# Patient Record
Sex: Male | Born: 1970 | Race: Black or African American | Hispanic: No | Marital: Married | Smoking: Never smoker
Health system: Southern US, Community
[De-identification: ages and names within clinical notes are randomized; demographics above are authoritative.]

## PROBLEM LIST (undated history)

## (undated) DIAGNOSIS — I10 Essential (primary) hypertension: Secondary | ICD-10-CM

## (undated) DIAGNOSIS — G931 Anoxic brain damage, not elsewhere classified: Secondary | ICD-10-CM

## (undated) HISTORY — PX: SHOULDER SURGERY: SHX246

## (undated) HISTORY — PX: BACK SURGERY: SHX140

---

## 2018-04-08 ENCOUNTER — Emergency Department
Admission: EM | Admit: 2018-04-08 | Discharge: 2018-04-08 | Disposition: A | Discharge status: Home / Self Care | Payer: Self-pay | Attending: Emergency Medicine | Admitting: Emergency Medicine

## 2018-04-08 ENCOUNTER — Encounter: Payer: Self-pay | Admitting: Emergency Medicine

## 2018-04-08 DIAGNOSIS — I10 Essential (primary) hypertension: Secondary | ICD-10-CM | POA: Insufficient documentation

## 2018-04-08 DIAGNOSIS — Z202 Contact with and (suspected) exposure to infections with a predominantly sexual mode of transmission: Secondary | ICD-10-CM | POA: Insufficient documentation

## 2018-04-08 DIAGNOSIS — H1132 Conjunctival hemorrhage, left eye: Secondary | ICD-10-CM | POA: Insufficient documentation

## 2018-04-08 LAB — CHLAMYDIA/NGC RT PCR (ARMC ONLY)
Chlamydia Tr: NOT DETECTED
N gonorrhoeae: NOT DETECTED

## 2018-04-08 MED ORDER — FLUORESCEIN SODIUM 1 MG OP STRP
1.0000 | ORAL_STRIP | Freq: Once | OPHTHALMIC | Status: AC
  Administered 2018-04-08: 1 via OPHTHALMIC
  Filled 2018-04-08: qty 1

## 2018-04-08 MED ORDER — TETRACAINE HCL 0.5 % OP SOLN
1.0000 [drp] | Freq: Once | OPHTHALMIC | Status: AC
  Administered 2018-04-08: 1 [drp] via OPHTHALMIC
  Filled 2018-04-08: qty 4

## 2018-04-08 MED ORDER — METRONIDAZOLE 500 MG PO TABS: 2000.0000 mg | ORAL_TABLET | Freq: Once | ORAL | 0 refills | Status: AC
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
## 2018-04-08 NOTE — ED Notes (Signed)
Pt is to provide dirty urine sample for STD screening. After providing urine, MD will swab pt for wet prep. Pt given fluids at this time to encourage urination
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
## 2018-04-08 NOTE — Discharge Instructions (Signed)
As I explained to you, your gonorrhea and chlamydia testing were negative. If your spouse tested positive for trichomonas please take the antibiotic dose prescribed to prevent her from getting infected again after her treatment.   Make sure to follow up with [facility] in 2 days for re-evaluation.
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
## 2019-12-06 ENCOUNTER — Emergency Department: Payer: BC Managed Care – PPO

## 2019-12-06 ENCOUNTER — Telehealth: Payer: Self-pay | Admitting: Emergency Medicine

## 2019-12-06 ENCOUNTER — Encounter: Payer: Self-pay | Admitting: Emergency Medicine

## 2019-12-06 ENCOUNTER — Other Ambulatory Visit: Payer: Self-pay

## 2019-12-06 ENCOUNTER — Emergency Department
Admission: EM | Admit: 2019-12-06 | Discharge: 2019-12-06 | Disposition: A | Discharge status: Home / Self Care | Payer: BC Managed Care – PPO | Attending: Emergency Medicine | Admitting: Emergency Medicine

## 2019-12-06 DIAGNOSIS — Y9389 Activity, other specified: Secondary | ICD-10-CM | POA: Diagnosis not present

## 2019-12-06 DIAGNOSIS — S61215A Laceration without foreign body of left ring finger without damage to nail, initial encounter: Secondary | ICD-10-CM | POA: Insufficient documentation

## 2019-12-06 DIAGNOSIS — Z79899 Other long term (current) drug therapy: Secondary | ICD-10-CM | POA: Insufficient documentation

## 2019-12-06 DIAGNOSIS — I10 Essential (primary) hypertension: Secondary | ICD-10-CM | POA: Diagnosis not present

## 2019-12-06 HISTORY — DX: Essential (primary) hypertension: I10

## 2019-12-06 MED ORDER — LISINOPRIL 10 MG PO TABS: 10.0000 mg | ORAL_TABLET | Freq: Every day | ORAL | 1 refills | Status: DC

## 2019-12-06 MED ORDER — LISINOPRIL 10 MG PO TABS
10.0000 mg | ORAL_TABLET | Freq: Once | ORAL | Status: AC
  Administered 2019-12-06: 10 mg via ORAL
  Filled 2019-12-06: qty 1

## 2019-12-06 MED ORDER — AMOXICILLIN-POT CLAVULANATE 875-125 MG PO TABS
1.0000 | ORAL_TABLET | Freq: Once | ORAL | Status: AC
  Administered 2019-12-06: 1 via ORAL
  Filled 2019-12-06: qty 1

## 2019-12-06 MED ORDER — AMOXICILLIN-POT CLAVULANATE 875-125 MG PO TABS: 1.0000 | ORAL_TABLET | Freq: Two times a day (BID) | ORAL | 0 refills | Status: AC

## 2019-12-06 NOTE — Discharge Instructions (Signed)
Follow-up with your primary care provider or [facility] acute care if any continued problems.  Begin taking antibiotics as directed to prevent infection and keep the area clean and dry.  Also take your blood pressure every day as it was elevated at 187/110 today.  You also need to follow-up with your primary care provider to have your blood pressure rechecked to see if additional blood pressure medication is needed.
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
## 2019-12-06 NOTE — ED Notes (Signed)
See triage note; pt is ambulatory to the room in no acute distress, bleeding is controlled.
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
## 2021-03-23 ENCOUNTER — Inpatient Hospital Stay: Payer: BC Managed Care – PPO

## 2021-03-23 ENCOUNTER — Inpatient Hospital Stay
Admit: 2021-03-23 | Discharge: 2021-03-23 | Disposition: A | Discharge status: Home / Self Care | Payer: BC Managed Care – PPO | Attending: Cardiology | Admitting: Cardiology

## 2021-03-23 ENCOUNTER — Emergency Department: Payer: BC Managed Care – PPO

## 2021-03-23 ENCOUNTER — Encounter: Payer: Self-pay | Admitting: Cardiology

## 2021-03-23 ENCOUNTER — Encounter
Admission: EM | Disposition: A | Discharge status: Other Acute Inpatient Hospital | Payer: Self-pay | Source: Home / Self Care | Attending: Cardiology

## 2021-03-23 ENCOUNTER — Inpatient Hospital Stay
Admission: EM | Admit: 2021-03-23 | Discharge: 2021-03-30 | DRG: 286 | Disposition: A | Discharge status: Other Acute Inpatient Hospital | Payer: BC Managed Care – PPO | Attending: Internal Medicine | Admitting: Internal Medicine

## 2021-03-23 DIAGNOSIS — I2109 ST elevation (STEMI) myocardial infarction involving other coronary artery of anterior wall: Secondary | ICD-10-CM | POA: Diagnosis not present

## 2021-03-23 DIAGNOSIS — J9690 Respiratory failure, unspecified, unspecified whether with hypoxia or hypercapnia: Secondary | ICD-10-CM

## 2021-03-23 DIAGNOSIS — I5020 Unspecified systolic (congestive) heart failure: Secondary | ICD-10-CM | POA: Diagnosis not present

## 2021-03-23 DIAGNOSIS — I130 Hypertensive heart and chronic kidney disease with heart failure and stage 1 through stage 4 chronic kidney disease, or unspecified chronic kidney disease: Secondary | ICD-10-CM | POA: Diagnosis present

## 2021-03-23 DIAGNOSIS — G9341 Metabolic encephalopathy: Secondary | ICD-10-CM | POA: Diagnosis present

## 2021-03-23 DIAGNOSIS — N182 Chronic kidney disease, stage 2 (mild): Secondary | ICD-10-CM | POA: Diagnosis present

## 2021-03-23 DIAGNOSIS — I4891 Unspecified atrial fibrillation: Secondary | ICD-10-CM | POA: Diagnosis present

## 2021-03-23 DIAGNOSIS — I2510 Atherosclerotic heart disease of native coronary artery without angina pectoris: Secondary | ICD-10-CM | POA: Diagnosis present

## 2021-03-23 DIAGNOSIS — Z515 Encounter for palliative care: Secondary | ICD-10-CM | POA: Diagnosis not present

## 2021-03-23 DIAGNOSIS — E870 Hyperosmolality and hypernatremia: Secondary | ICD-10-CM | POA: Diagnosis not present

## 2021-03-23 DIAGNOSIS — G40901 Epilepsy, unspecified, not intractable, with status epilepticus: Secondary | ICD-10-CM | POA: Diagnosis present

## 2021-03-23 DIAGNOSIS — R1310 Dysphagia, unspecified: Secondary | ICD-10-CM | POA: Diagnosis not present

## 2021-03-23 DIAGNOSIS — Y732 Prosthetic and other implants, materials and accessory gastroenterology and urology devices associated with adverse incidents: Secondary | ICD-10-CM | POA: Diagnosis not present

## 2021-03-23 DIAGNOSIS — G928 Other toxic encephalopathy: Secondary | ICD-10-CM | POA: Diagnosis present

## 2021-03-23 DIAGNOSIS — T783XXA Angioneurotic edema, initial encounter: Secondary | ICD-10-CM | POA: Diagnosis not present

## 2021-03-23 DIAGNOSIS — I5021 Acute systolic (congestive) heart failure: Secondary | ICD-10-CM | POA: Diagnosis present

## 2021-03-23 DIAGNOSIS — A419 Sepsis, unspecified organism: Secondary | ICD-10-CM | POA: Diagnosis not present

## 2021-03-23 DIAGNOSIS — J151 Pneumonia due to Pseudomonas: Secondary | ICD-10-CM | POA: Diagnosis not present

## 2021-03-23 DIAGNOSIS — J15211 Pneumonia due to Methicillin susceptible Staphylococcus aureus: Secondary | ICD-10-CM | POA: Diagnosis not present

## 2021-03-23 DIAGNOSIS — G40409 Other generalized epilepsy and epileptic syndromes, not intractable, without status epilepticus: Secondary | ICD-10-CM | POA: Diagnosis not present

## 2021-03-23 DIAGNOSIS — I2782 Chronic pulmonary embolism: Secondary | ICD-10-CM | POA: Diagnosis not present

## 2021-03-23 DIAGNOSIS — Z20822 Contact with and (suspected) exposure to covid-19: Secondary | ICD-10-CM | POA: Diagnosis present

## 2021-03-23 DIAGNOSIS — K7200 Acute and subacute hepatic failure without coma: Secondary | ICD-10-CM | POA: Diagnosis present

## 2021-03-23 DIAGNOSIS — I472 Ventricular tachycardia, unspecified: Secondary | ICD-10-CM | POA: Diagnosis present

## 2021-03-23 DIAGNOSIS — G253 Myoclonus: Secondary | ICD-10-CM | POA: Diagnosis not present

## 2021-03-23 DIAGNOSIS — N179 Acute kidney failure, unspecified: Secondary | ICD-10-CM | POA: Diagnosis present

## 2021-03-23 DIAGNOSIS — I5023 Acute on chronic systolic (congestive) heart failure: Secondary | ICD-10-CM | POA: Diagnosis not present

## 2021-03-23 DIAGNOSIS — A4152 Sepsis due to Pseudomonas: Secondary | ICD-10-CM | POA: Diagnosis not present

## 2021-03-23 DIAGNOSIS — I10 Essential (primary) hypertension: Secondary | ICD-10-CM

## 2021-03-23 DIAGNOSIS — G9389 Other specified disorders of brain: Secondary | ICD-10-CM | POA: Diagnosis present

## 2021-03-23 DIAGNOSIS — E440 Moderate protein-calorie malnutrition: Secondary | ICD-10-CM | POA: Diagnosis not present

## 2021-03-23 DIAGNOSIS — E785 Hyperlipidemia, unspecified: Secondary | ICD-10-CM | POA: Diagnosis present

## 2021-03-23 DIAGNOSIS — M7989 Other specified soft tissue disorders: Secondary | ICD-10-CM | POA: Diagnosis not present

## 2021-03-23 DIAGNOSIS — A4101 Sepsis due to Methicillin susceptible Staphylococcus aureus: Secondary | ICD-10-CM | POA: Diagnosis not present

## 2021-03-23 DIAGNOSIS — I4901 Ventricular fibrillation: Secondary | ICD-10-CM | POA: Diagnosis present

## 2021-03-23 DIAGNOSIS — Z7189 Other specified counseling: Secondary | ICD-10-CM | POA: Diagnosis not present

## 2021-03-23 DIAGNOSIS — Z4659 Encounter for fitting and adjustment of other gastrointestinal appliance and device: Secondary | ICD-10-CM

## 2021-03-23 DIAGNOSIS — Z930 Tracheostomy status: Secondary | ICD-10-CM | POA: Diagnosis not present

## 2021-03-23 DIAGNOSIS — T464X5A Adverse effect of angiotensin-converting-enzyme inhibitors, initial encounter: Secondary | ICD-10-CM | POA: Diagnosis present

## 2021-03-23 DIAGNOSIS — J189 Pneumonia, unspecified organism: Secondary | ICD-10-CM | POA: Diagnosis not present

## 2021-03-23 DIAGNOSIS — J9601 Acute respiratory failure with hypoxia: Secondary | ICD-10-CM | POA: Diagnosis not present

## 2021-03-23 DIAGNOSIS — Z9911 Dependence on respirator [ventilator] status: Secondary | ICD-10-CM | POA: Diagnosis not present

## 2021-03-23 DIAGNOSIS — I462 Cardiac arrest due to underlying cardiac condition: Secondary | ICD-10-CM | POA: Diagnosis present

## 2021-03-23 DIAGNOSIS — R509 Fever, unspecified: Secondary | ICD-10-CM | POA: Diagnosis not present

## 2021-03-23 DIAGNOSIS — R739 Hyperglycemia, unspecified: Secondary | ICD-10-CM | POA: Diagnosis present

## 2021-03-23 DIAGNOSIS — Z79899 Other long term (current) drug therapy: Secondary | ICD-10-CM | POA: Diagnosis not present

## 2021-03-23 DIAGNOSIS — I161 Hypertensive emergency: Secondary | ICD-10-CM | POA: Diagnosis present

## 2021-03-23 DIAGNOSIS — R6520 Severe sepsis without septic shock: Secondary | ICD-10-CM | POA: Diagnosis not present

## 2021-03-23 DIAGNOSIS — Y92009 Unspecified place in unspecified non-institutional (private) residence as the place of occurrence of the external cause: Secondary | ICD-10-CM

## 2021-03-23 DIAGNOSIS — G931 Anoxic brain damage, not elsewhere classified: Secondary | ICD-10-CM | POA: Diagnosis present

## 2021-03-23 DIAGNOSIS — Z8674 Personal history of sudden cardiac arrest: Secondary | ICD-10-CM | POA: Diagnosis present

## 2021-03-23 DIAGNOSIS — Z9289 Personal history of other medical treatment: Secondary | ICD-10-CM | POA: Diagnosis not present

## 2021-03-23 DIAGNOSIS — J9602 Acute respiratory failure with hypercapnia: Secondary | ICD-10-CM | POA: Diagnosis present

## 2021-03-23 DIAGNOSIS — Z7901 Long term (current) use of anticoagulants: Secondary | ICD-10-CM | POA: Diagnosis not present

## 2021-03-23 DIAGNOSIS — E669 Obesity, unspecified: Secondary | ICD-10-CM | POA: Diagnosis not present

## 2021-03-23 DIAGNOSIS — R0902 Hypoxemia: Secondary | ICD-10-CM

## 2021-03-23 DIAGNOSIS — I110 Hypertensive heart disease with heart failure: Secondary | ICD-10-CM | POA: Diagnosis not present

## 2021-03-23 DIAGNOSIS — J9621 Acute and chronic respiratory failure with hypoxia: Secondary | ICD-10-CM | POA: Diagnosis not present

## 2021-03-23 DIAGNOSIS — T17908D Unspecified foreign body in respiratory tract, part unspecified causing other injury, subsequent encounter: Secondary | ICD-10-CM | POA: Diagnosis not present

## 2021-03-23 DIAGNOSIS — Y846 Urinary catheterization as the cause of abnormal reaction of the patient, or of later complication, without mention of misadventure at the time of the procedure: Secondary | ICD-10-CM | POA: Diagnosis not present

## 2021-03-23 DIAGNOSIS — Z538 Procedure and treatment not carried out for other reasons: Secondary | ICD-10-CM | POA: Diagnosis not present

## 2021-03-23 DIAGNOSIS — J690 Pneumonitis due to inhalation of food and vomit: Secondary | ICD-10-CM | POA: Diagnosis not present

## 2021-03-23 DIAGNOSIS — Y848 Other medical procedures as the cause of abnormal reaction of the patient, or of later complication, without mention of misadventure at the time of the procedure: Secondary | ICD-10-CM | POA: Diagnosis not present

## 2021-03-23 DIAGNOSIS — R7881 Bacteremia: Secondary | ICD-10-CM | POA: Diagnosis not present

## 2021-03-23 DIAGNOSIS — Y95 Nosocomial condition: Secondary | ICD-10-CM | POA: Diagnosis not present

## 2021-03-23 DIAGNOSIS — I493 Ventricular premature depolarization: Secondary | ICD-10-CM | POA: Diagnosis present

## 2021-03-23 DIAGNOSIS — I469 Cardiac arrest, cause unspecified: Secondary | ICD-10-CM | POA: Diagnosis not present

## 2021-03-23 DIAGNOSIS — J95851 Ventilator associated pneumonia: Secondary | ICD-10-CM | POA: Diagnosis not present

## 2021-03-23 DIAGNOSIS — I2699 Other pulmonary embolism without acute cor pulmonale: Secondary | ICD-10-CM | POA: Diagnosis not present

## 2021-03-23 DIAGNOSIS — L89893 Pressure ulcer of other site, stage 3: Secondary | ICD-10-CM | POA: Diagnosis not present

## 2021-03-23 DIAGNOSIS — D649 Anemia, unspecified: Secondary | ICD-10-CM | POA: Diagnosis not present

## 2021-03-23 DIAGNOSIS — E7220 Disorder of urea cycle metabolism, unspecified: Secondary | ICD-10-CM | POA: Diagnosis not present

## 2021-03-23 HISTORY — PX: CORONARY/GRAFT ACUTE MI REVASCULARIZATION: CATH118305

## 2021-03-23 HISTORY — PX: LEFT HEART CATH AND CORONARY ANGIOGRAPHY: CATH118249

## 2021-03-23 LAB — PROTIME-INR
INR: 1 (ref 0.8–1.2)
Prothrombin Time: 13.5 seconds (ref 11.4–15.2)

## 2021-03-23 LAB — COMPREHENSIVE METABOLIC PANEL
ALT: 130 U/L — ABNORMAL HIGH (ref 0–44)
AST: 150 U/L — ABNORMAL HIGH (ref 15–41)
Albumin: 3.8 g/dL (ref 3.5–5.0)
Alkaline Phosphatase: 74 U/L (ref 38–126)
Anion gap: 14 (ref 5–15)
BUN: 26 mg/dL — ABNORMAL HIGH (ref 6–20)
CO2: 20 mmol/L — ABNORMAL LOW (ref 22–32)
Calcium: 8.4 mg/dL — ABNORMAL LOW (ref 8.9–10.3)
Chloride: 104 mmol/L (ref 98–111)
Creatinine, Ser: 2.08 mg/dL — ABNORMAL HIGH (ref 0.61–1.24)
GFR, Estimated: 38 mL/min — ABNORMAL LOW (ref >60)
Glucose, Bld: 249 mg/dL — ABNORMAL HIGH (ref 70–99)
Potassium: 3.5 mmol/L (ref 3.5–5.1)
Sodium: 138 mmol/L (ref 135–145)
Total Bilirubin: 1.2 mg/dL (ref 0.3–1.2)
Total Protein: 7.2 g/dL (ref 6.5–8.1)

## 2021-03-23 LAB — CBC WITH DIFFERENTIAL/PLATELET
Abs Immature Granulocytes: 0.39 10*3/uL — ABNORMAL HIGH (ref 0.00–0.07)
Basophils Absolute: 0 10*3/uL (ref 0.0–0.1)
Basophils Relative: 0 %
Eosinophils Absolute: 0.2 10*3/uL (ref 0.0–0.5)
Eosinophils Relative: 1 %
HCT: 45.2 % (ref 39.0–52.0)
Hemoglobin: 14.8 g/dL (ref 13.0–17.0)
Immature Granulocytes: 3 %
Lymphocytes Relative: 44 %
Lymphs Abs: 5.6 10*3/uL — ABNORMAL HIGH (ref 0.7–4.0)
MCH: 30.5 pg (ref 26.0–34.0)
MCHC: 32.7 g/dL (ref 30.0–36.0)
MCV: 93.2 fL (ref 80.0–100.0)
Monocytes Absolute: 0.7 10*3/uL (ref 0.1–1.0)
Monocytes Relative: 6 %
Neutro Abs: 5.8 10*3/uL (ref 1.7–7.7)
Neutrophils Relative %: 46 %
Platelets: 228 10*3/uL (ref 150–400)
RBC: 4.85 MIL/uL (ref 4.22–5.81)
RDW: 13 % (ref 11.5–15.5)
WBC: 12.6 10*3/uL — ABNORMAL HIGH (ref 4.0–10.5)
nRBC: 0 % (ref 0.0–0.2)

## 2021-03-23 LAB — MRSA NEXT GEN BY PCR, NASAL: MRSA by PCR Next Gen: NOT DETECTED

## 2021-03-23 LAB — CBG MONITORING, ED: Glucose-Capillary: 284 mg/dL — ABNORMAL HIGH (ref 70–99)

## 2021-03-23 LAB — BLOOD GAS, ARTERIAL
Acid-base deficit: 0.3 mmol/L (ref 0.0–2.0)
Bicarbonate: 25.9 mmol/L (ref 20.0–28.0)
FIO2: 0.6
MECHVT: 500 mL
Mechanical Rate: 20
O2 Saturation: 99.7 %
PEEP: 5 cmH2O
Patient temperature: 36.2
RATE: 20 resp/min
pCO2 arterial: 45 mmHg (ref 32.0–48.0)
pH, Arterial: 7.36 (ref 7.350–7.450)
pO2, Arterial: 212 mmHg — ABNORMAL HIGH (ref 83.0–108.0)

## 2021-03-23 LAB — ECHOCARDIOGRAM COMPLETE
AR max vel: 2.63 cm2
AV Area VTI: 3.03 cm2
AV Area mean vel: 2.53 cm2
AV Mean grad: 2 mmHg
AV Peak grad: 3.7 mmHg
Ao pk vel: 0.97 m/s
Area-P 1/2: 7.99 cm2
Calc EF: 26.8 %
Height: 71 in
MV VTI: 2.25 cm2
S' Lateral: 5.17 cm
Single Plane A2C EF: 28.8 %
Single Plane A4C EF: 22.7 %
Weight: 3869.51 oz

## 2021-03-23 LAB — URINE DRUG SCREEN, QUALITATIVE (ARMC ONLY)
Amphetamines, Ur Screen: NOT DETECTED
Barbiturates, Ur Screen: NOT DETECTED
Benzodiazepine, Ur Scrn: NOT DETECTED
Cannabinoid 50 Ng, Ur ~~LOC~~: NOT DETECTED
Cocaine Metabolite,Ur ~~LOC~~: NOT DETECTED
MDMA (Ecstasy)Ur Screen: NOT DETECTED
Methadone Scn, Ur: NOT DETECTED
Opiate, Ur Screen: NOT DETECTED
Phencyclidine (PCP) Ur S: NOT DETECTED
Tricyclic, Ur Screen: NOT DETECTED

## 2021-03-23 LAB — HEMOGLOBIN A1C
Hgb A1c MFr Bld: 5.5 % (ref 4.8–5.6)
Hgb A1c MFr Bld: 5.4 % (ref 4.8–5.6)
Mean Plasma Glucose: 111.15 mg/dL
Mean Plasma Glucose: 108.28 mg/dL

## 2021-03-23 LAB — HEPARIN LEVEL (UNFRACTIONATED): Heparin Unfractionated: 0.43 IU/mL (ref 0.30–0.70)

## 2021-03-23 LAB — RESP PANEL BY RT-PCR (FLU A&B, COVID) ARPGX2
Influenza A by PCR: NEGATIVE
Influenza B by PCR: NEGATIVE
SARS Coronavirus 2 by RT PCR: NEGATIVE

## 2021-03-23 LAB — LIPID PANEL
Cholesterol: 254 mg/dL — ABNORMAL HIGH (ref 0–200)
HDL: 52 mg/dL (ref >40)
LDL Cholesterol: 174 mg/dL — ABNORMAL HIGH (ref 0–99)
Total CHOL/HDL Ratio: 4.9 RATIO
Triglycerides: 138 mg/dL (ref <150)
VLDL: 28 mg/dL (ref 0–40)

## 2021-03-23 LAB — TROPONIN I (HIGH SENSITIVITY)
Troponin I (High Sensitivity): 425 ng/L (ref <18)
Troponin I (High Sensitivity): >24000 ng/L (ref <18)
Troponin I (High Sensitivity): >24000 ng/L (ref <18)

## 2021-03-23 LAB — ETHANOL: Alcohol, Ethyl (B): <10 mg/dL (ref <10)

## 2021-03-23 LAB — MAGNESIUM
Magnesium: 2.4 mg/dL (ref 1.7–2.4)
Magnesium: 2.4 mg/dL (ref 1.7–2.4)

## 2021-03-23 LAB — GLUCOSE, CAPILLARY
Glucose-Capillary: 185 mg/dL — ABNORMAL HIGH (ref 70–99)
Glucose-Capillary: 116 mg/dL — ABNORMAL HIGH (ref 70–99)
Glucose-Capillary: 127 mg/dL — ABNORMAL HIGH (ref 70–99)
Glucose-Capillary: 104 mg/dL — ABNORMAL HIGH (ref 70–99)
Glucose-Capillary: 117 mg/dL — ABNORMAL HIGH (ref 70–99)
Glucose-Capillary: 91 mg/dL (ref 70–99)

## 2021-03-23 LAB — PHOSPHORUS
Phosphorus: 2.5 mg/dL (ref 2.5–4.6)
Phosphorus: 3 mg/dL (ref 2.5–4.6)

## 2021-03-23 LAB — APTT: aPTT: 29 seconds (ref 24–36)

## 2021-03-23 SURGERY — CORONARY/GRAFT ACUTE MI REVASCULARIZATION
Anesthesia: Moderate Sedation

## 2021-03-23 MED ORDER — ADULT MULTIVITAMIN LIQUID CH
15.0000 mL | Freq: Every day | ORAL | Status: DC
  Administered 2021-03-24 – 2021-03-27 (×4): 15 mL
  Filled 2021-03-23 (×4): qty 15

## 2021-03-23 MED ORDER — ACETAMINOPHEN 160 MG/5ML PO SOLN
650.0000 mg | ORAL | Status: AC
  Administered 2021-03-23: 650 mg
  Filled 2021-03-23 (×2): qty 20.3

## 2021-03-23 MED ORDER — BUSPIRONE HCL 15 MG PO TABS
30.0000 mg | ORAL_TABLET | Freq: Three times a day (TID) | ORAL | Status: AC | PRN
  Filled 2021-03-23: qty 2

## 2021-03-23 MED ORDER — PROPOFOL 1000 MG/100ML IV EMUL
5.0000 ug/kg/min | INTRAVENOUS | Status: DC
  Administered 2021-03-23 (×3): 25 ug/kg/min via INTRAVENOUS
  Administered 2021-03-23: 40 ug/kg/min via INTRAVENOUS
  Administered 2021-03-24 (×2): 25 ug/kg/min via INTRAVENOUS
  Administered 2021-03-24: 20 ug/kg/min via INTRAVENOUS
  Administered 2021-03-25: 15 ug/kg/min via INTRAVENOUS
  Filled 2021-03-23 (×9): qty 100

## 2021-03-23 MED ORDER — SODIUM CHLORIDE 0.9 % WEIGHT BASED INFUSION
1.0000 mL/kg/h | INTRAVENOUS | Status: AC
  Administered 2021-03-23 (×2): 1 mL/kg/h via INTRAVENOUS

## 2021-03-23 MED ORDER — IOHEXOL 300 MG/ML  SOLN
INTRAMUSCULAR | Status: DC | PRN
  Administered 2021-03-23: 105 mL

## 2021-03-23 MED ORDER — ROCURONIUM BROMIDE 50 MG/5ML IV SOLN
100.0000 mg | Freq: Once | INTRAVENOUS | Status: AC
  Administered 2021-03-23: 100 mg via INTRAVENOUS

## 2021-03-23 MED ORDER — MAGNESIUM SULFATE 2 GM/50ML IV SOLN
2.0000 g | Freq: Once | INTRAVENOUS | Status: AC | PRN
  Filled 2021-03-23: qty 50

## 2021-03-23 MED ORDER — ENOXAPARIN SODIUM 60 MG/0.6ML IJ SOSY
0.5000 mg/kg | PREFILLED_SYRINGE | INTRAMUSCULAR | Status: DC
  Administered 2021-03-23 – 2021-03-29 (×7): 55 mg via SUBCUTANEOUS
  Filled 2021-03-23 (×8): qty 0.55

## 2021-03-23 MED ORDER — LABETALOL HCL 5 MG/ML IV SOLN
10.0000 mg | INTRAVENOUS | Status: AC | PRN
  Administered 2021-03-23: 10 mg via INTRAVENOUS
  Filled 2021-03-23: qty 4

## 2021-03-23 MED ORDER — POLYETHYLENE GLYCOL 3350 17 G PO PACK
17.0000 g | PACK | Freq: Every day | ORAL | Status: DC
  Administered 2021-03-23 – 2021-03-26 (×4): 17 g
  Filled 2021-03-23 (×4): qty 1

## 2021-03-23 MED ORDER — VITAL HIGH PROTEIN PO LIQD
1000.0000 mL | ORAL | Status: DC
  Administered 2021-03-23 – 2021-03-27 (×5): 1000 mL

## 2021-03-23 MED ORDER — FENTANYL 2500MCG IN NS 250ML (10MCG/ML) PREMIX INFUSION: 50.0000 ug/h | INTRAVENOUS | Status: DC

## 2021-03-23 MED ORDER — NOREPINEPHRINE 4 MG/250ML-% IV SOLN: 2.0000 ug/min | INTRAVENOUS | Status: DC

## 2021-03-23 MED ORDER — LEVETIRACETAM IN NACL 500 MG/100ML IV SOLN
500.0000 mg | Freq: Two times a day (BID) | INTRAVENOUS | Status: DC
  Administered 2021-03-23 – 2021-03-28 (×11): 500 mg via INTRAVENOUS
  Filled 2021-03-23 (×13): qty 100

## 2021-03-23 MED ORDER — HYDRALAZINE HCL 20 MG/ML IJ SOLN
10.0000 mg | INTRAMUSCULAR | Status: AC | PRN
  Filled 2021-03-23: qty 1

## 2021-03-23 MED ORDER — LIDOCAINE HCL (PF) 1 % IJ SOLN
INTRAMUSCULAR | Status: DC | PRN
  Administered 2021-03-23: 2 mL

## 2021-03-23 MED ORDER — SODIUM CHLORIDE 0.9% FLUSH: 10.0000 mL | Freq: Three times a day (TID) | INTRAVENOUS | Status: DC

## 2021-03-23 MED ORDER — MIDAZOLAM HCL 2 MG/2ML IJ SOLN: 2.0000 mg | Freq: Once | INTRAMUSCULAR | Status: DC

## 2021-03-23 MED ORDER — LEVETIRACETAM IN NACL 1000 MG/100ML IV SOLN
1000.0000 mg | Freq: Once | INTRAVENOUS | Status: AC
  Administered 2021-03-23: 1000 mg via INTRAVENOUS
  Filled 2021-03-23: qty 100

## 2021-03-23 MED ORDER — ACETAMINOPHEN 325 MG PO TABS: 650.0000 mg | ORAL_TABLET | ORAL | Status: DC | PRN

## 2021-03-23 MED ORDER — PROSOURCE TF PO LIQD
90.0000 mL | Freq: Three times a day (TID) | ORAL | Status: DC
  Administered 2021-03-23 – 2021-03-27 (×12): 90 mL
  Filled 2021-03-23 (×14): qty 90

## 2021-03-23 MED ORDER — SODIUM CHLORIDE 0.9 % IV SOLN: 250.0000 mL | INTRAVENOUS | Status: DC

## 2021-03-23 MED ORDER — PROPOFOL 1000 MG/100ML IV EMUL
INTRAVENOUS | Status: AC
  Administered 2021-03-23: 40 ug/kg/min via INTRAVENOUS
  Filled 2021-03-23: qty 100

## 2021-03-23 MED ORDER — ACETAMINOPHEN 650 MG RE SUPP: 650.0000 mg | RECTAL | Status: DC | PRN

## 2021-03-23 MED ORDER — HYDRALAZINE HCL 20 MG/ML IJ SOLN
10.0000 mg | Freq: Four times a day (QID) | INTRAMUSCULAR | Status: DC | PRN
  Administered 2021-03-25: 10 mg via INTRAVENOUS
  Filled 2021-03-23: qty 1

## 2021-03-23 MED ORDER — HEPARIN SODIUM (PORCINE) 1000 UNIT/ML IJ SOLN
INTRAMUSCULAR | Status: AC
  Filled 2021-03-23: qty 10

## 2021-03-23 MED ORDER — PROPOFOL 1000 MG/100ML IV EMUL: 0.0000 ug/kg/min | INTRAVENOUS | Status: DC

## 2021-03-23 MED ORDER — PROSOURCE TF PO LIQD
45.0000 mL | Freq: Two times a day (BID) | ORAL | Status: DC
  Administered 2021-03-23: 45 mL
  Filled 2021-03-23: qty 45

## 2021-03-23 MED ORDER — HEPARIN SODIUM (PORCINE) 1000 UNIT/ML IJ SOLN
INTRAMUSCULAR | Status: DC | PRN
  Administered 2021-03-23: 5000 [IU] via INTRAVENOUS

## 2021-03-23 MED ORDER — ONDANSETRON HCL 4 MG/2ML IJ SOLN: 4.0000 mg | Freq: Four times a day (QID) | INTRAMUSCULAR | Status: DC | PRN

## 2021-03-23 MED ORDER — DOCUSATE SODIUM 50 MG/5ML PO LIQD
100.0000 mg | Freq: Two times a day (BID) | ORAL | Status: DC
  Administered 2021-03-23 – 2021-03-26 (×8): 100 mg
  Filled 2021-03-23 (×8): qty 10

## 2021-03-23 MED ORDER — SODIUM CHLORIDE 0.9% FLUSH
3.0000 mL | Freq: Two times a day (BID) | INTRAVENOUS | Status: DC
  Administered 2021-03-23 – 2021-03-30 (×14): 3 mL via INTRAVENOUS

## 2021-03-23 MED ORDER — PROPOFOL 1000 MG/100ML IV EMUL: 5.0000 ug/kg/min | INTRAVENOUS | Status: DC

## 2021-03-23 MED ORDER — PANTOPRAZOLE SODIUM 40 MG IV SOLR
40.0000 mg | Freq: Every day | INTRAVENOUS | Status: DC
  Administered 2021-03-23 – 2021-03-24 (×2): 40 mg via INTRAVENOUS
  Filled 2021-03-23 (×2): qty 40

## 2021-03-23 MED ORDER — VERAPAMIL HCL 2.5 MG/ML IV SOLN
INTRAVENOUS | Status: AC
  Filled 2021-03-23: qty 2

## 2021-03-23 MED ORDER — CHLORHEXIDINE GLUCONATE CLOTH 2 % EX PADS
6.0000 | MEDICATED_PAD | Freq: Every day | CUTANEOUS | Status: DC
  Administered 2021-03-23 – 2021-03-30 (×8): 6 via TOPICAL

## 2021-03-23 MED ORDER — HEPARIN (PORCINE) IN NACL 1000-0.9 UT/500ML-% IV SOLN
INTRAVENOUS | Status: AC
  Filled 2021-03-23: qty 1000

## 2021-03-23 MED ORDER — ACETAMINOPHEN 160 MG/5ML PO SOLN: 650.0000 mg | ORAL | Status: DC | PRN

## 2021-03-23 MED ORDER — FENTANYL BOLUS VIA INFUSION
50.0000 ug | INTRAVENOUS | Status: DC | PRN
  Filled 2021-03-23: qty 100

## 2021-03-23 MED ORDER — SODIUM CHLORIDE 0.9 % IV SOLN: 250.0000 mL | INTRAVENOUS | Status: DC | PRN

## 2021-03-23 MED ORDER — SODIUM CHLORIDE 0.9% FLUSH: 3.0000 mL | INTRAVENOUS | Status: DC | PRN

## 2021-03-23 MED ORDER — INSULIN ASPART 100 UNIT/ML IJ SOLN
0.0000 [IU] | INTRAMUSCULAR | Status: DC
  Administered 2021-03-23: 3 [IU] via SUBCUTANEOUS
  Administered 2021-03-25 – 2021-03-26 (×8): 2 [IU] via SUBCUTANEOUS
  Administered 2021-03-26: 3 [IU] via SUBCUTANEOUS
  Administered 2021-03-27 – 2021-03-30 (×11): 2 [IU] via SUBCUTANEOUS
  Filled 2021-03-23 (×20): qty 1

## 2021-03-23 MED ORDER — LABETALOL HCL 5 MG/ML IV SOLN
10.0000 mg | INTRAVENOUS | Status: DC | PRN
  Administered 2021-03-25 – 2021-03-30 (×6): 10 mg via INTRAVENOUS
  Filled 2021-03-23 (×7): qty 4

## 2021-03-23 MED ORDER — HEPARIN (PORCINE) 25000 UT/250ML-% IV SOLN
1300.0000 [IU]/h | INTRAVENOUS | Status: DC
  Administered 2021-03-23: 1300 [IU]/h via INTRAVENOUS
  Filled 2021-03-23: qty 250

## 2021-03-23 MED ORDER — ETOMIDATE 2 MG/ML IV SOLN
20.0000 mg | Freq: Once | INTRAVENOUS | Status: AC
  Administered 2021-03-23: 20 mg via INTRAVENOUS

## 2021-03-23 MED ORDER — ACETAMINOPHEN 325 MG PO TABS
650.0000 mg | ORAL_TABLET | ORAL | Status: AC
  Administered 2021-03-23 – 2021-03-24 (×8): 650 mg via ORAL
  Filled 2021-03-23 (×8): qty 2

## 2021-03-23 MED ORDER — VERAPAMIL HCL 2.5 MG/ML IV SOLN
INTRAVENOUS | Status: DC | PRN
  Administered 2021-03-23: 2.5 mg via INTRAVENOUS

## 2021-03-23 MED ORDER — HEPARIN (PORCINE) IN NACL 1000-0.9 UT/500ML-% IV SOLN
INTRAVENOUS | Status: DC | PRN
  Administered 2021-03-23 (×2): 500 mL

## 2021-03-23 MED ORDER — FENTANYL CITRATE PF 50 MCG/ML IJ SOSY: 50.0000 ug | PREFILLED_SYRINGE | Freq: Once | INTRAMUSCULAR | Status: DC

## 2021-03-23 MED ORDER — ACETAMINOPHEN 650 MG RE SUPP: 650.0000 mg | RECTAL | Status: AC

## 2021-03-23 SURGICAL SUPPLY — 12 items
CATH 5F 110X4 TIG (CATHETERS) ×1 IMPLANT
CATH INFINITI 5FR ANG PIGTAIL (CATHETERS) ×1 IMPLANT
DEVICE RAD COMP TR BAND LRG (VASCULAR PRODUCTS) ×1 IMPLANT
DRAPE BRACHIAL (DRAPES) ×1 IMPLANT
GLIDESHEATH SLEND SS 6F .021 (SHEATH) ×1 IMPLANT
GUIDEWIRE INQWIRE 1.5J.035X260 (WIRE) IMPLANT
INQWIRE 1.5J .035X260CM (WIRE) ×2
PACK CARDIAC CATH (CUSTOM PROCEDURE TRAY) ×2 IMPLANT
PROTECTION STATION PRESSURIZED (MISCELLANEOUS) ×2
SET ATX SIMPLICITY (MISCELLANEOUS) ×1 IMPLANT
STATION PROTECTION PRESSURIZED (MISCELLANEOUS) IMPLANT
TUBING CIL FLEX 10 FLL-RA (TUBING) ×1 IMPLANT
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
## 2021-03-23 NOTE — Progress Notes (Signed)
ANTICOAGULATION CONSULT NOTE - Initial Consult  Pharmacy Consult for Heparin  Indication: NSTEMI/ACS  No Known Allergies  Patient Measurements: Weight: 109.7 kg (241 lb 13.5 oz) Heparin Dosing Weight: 98.8 kg   Vital Signs: Temp: 97 F (36.1 C) (01/19 0500) Temp Source: Esophageal (01/19 0500) BP: 138/107 (01/19 0500) Pulse Rate: 84 (01/19 0500)  Labs: Recent Labs    03/23/21 0254  HGB 14.8  HCT 45.2  PLT 228  APTT 29  LABPROT 13.5  INR 1.0  CREATININE 2.08*  TROPONINIHS 425*    CrCl cannot be calculated (Unknown ideal weight.).   Medical History: Past Medical History:  Diagnosis Date   Hypertension     Medications:    Assessment: Pharmacy consulted to dose heparin in this 51 year old male admitted with NSTEMI/ACS.  No prior anticoag noted.  CrCl = 53.5 ml/min  Goal of Therapy:  Heparin level 0.3-0.7 units/ml Monitor platelets by anticoagulation protocol: Yes   Plan:  Heparin 5000 units SQ X 1 given on 1/19 @ 0329. Will order Heparin 1300 units/hr to start 1/19 @ ~ 0530. Will draw HL 6 hrs after start of drip.   Ercell Perlman D 03/23/2021,5:26 AM

## 2021-03-23 NOTE — Consult Note (Signed)
[facility] Cardiology  CARDIOLOGY CONSULT NOTE  Patient ID: Billy Cherry MRN: [id number] DOB/AGE: Feb 25, 1971 50 y.o.  Admit date: 03/23/2021 Referring Physician [facility] Primary Physician  Primary Cardiologist  Reason for Consultation cardiopulmonary arrest  HPI: 51 year old gentleman referred for cardiopulmonary arrest with suspicion for ST elevation myocardial infarction.  Patient has a history of essential hypertension.  Patient was sleeping, found by his wife agonal respirations, called EMS, and performed bystander CPR.  On EMS arrival patient was found to be in ventricular fibrillation and defibrillated x1, converted to asystole given 2 rounds of epinephrine.  Post defibrillation ECG revealed sinus tachycardia with right bundle branch block and apparent ST elevation in anterolateral leads.  Following arrival at [facility] ED, patient was intubated.  ECG in the ED revealed minus rhythm 85 bpm with nonspecific ST-T abnormalities the anterolateral leads.  The patient was brought to the cardiac catheterization laboratory coronary angiography revealed insignificant coronary artery disease.  Left ventriculography revealed mild left ventricular dysfunction with estimated LVEF 45 to 50%.  Review of systems complete and found to be negative unless listed above     Past Medical History:  Diagnosis Date   Hypertension     Past Surgical History:  Procedure Laterality Date   BACK SURGERY     SHOULDER SURGERY      (Not in a hospital admission)  Social History   Socioeconomic History   Marital status: Married    Spouse name: Not on file   Number of children: Not on file   Years of education: Not on file   Highest education level: Not on file  Occupational History   Not on file  Tobacco Use   Smoking status: Never   Smokeless tobacco: Never  Substance and Sexual Activity   Alcohol use: Yes   Drug use: Never   Sexual activity: Not on file  Other Topics Concern   Not on file  Social History  Narrative   Not on file   Social Determinants of Health   Financial Resource Strain: Not on file  Food Insecurity: Not on file  Transportation Needs: Not on file  Physical Activity: Not on file  Stress: Not on file  Social Connections: Not on file  Intimate Partner Violence: Not on file    No family history on file.    Review of systems complete and found to be negative unless listed above      PHYSICAL EXAM  General: Well developed, well nourished, in no acute distress HEENT:  Normocephalic and atramatic Neck:  No JVD.  Lungs: Clear bilaterally to auscultation and percussion. Heart: HRRR . Normal S1 and S2 without gallops or murmurs.  Abdomen: Bowel sounds are positive, abdomen soft and non-tender  Msk:  Back normal, normal gait. Normal strength and tone for age. Extremities: No clubbing, cyanosis or edema.   Neuro: Alert and oriented X 3. Psych:  Good affect, responds appropriately  Labs:   Lab Results  Component Value Date   WBC 12.6 (H) 03/23/2021   HGB 14.8 03/23/2021   HCT 45.2 03/23/2021   MCV 93.2 03/23/2021   PLT 228 03/23/2021    Recent Labs  Lab 03/23/21 0254  NA 138  K 3.5  CL 104  CO2 20*  BUN 26*  CREATININE 2.08*  CALCIUM 8.4*  PROT 7.2  BILITOT 1.2  ALKPHOS 74  ALT 130*  AST 150*  GLUCOSE 249*   No results found for: CKTOTAL, CKMB, CKMBINDEX, TROPONINI  Lab Results  Component Value Date  CHOL 254 (H) 03/23/2021   Lab Results  Component Value Date   HDL 52 03/23/2021   Lab Results  Component Value Date   LDLCALC 174 (H) 03/23/2021   Lab Results  Component Value Date   TRIG 138 03/23/2021   Lab Results  Component Value Date   CHOLHDL 4.9 03/23/2021   No results found for: LDLDIRECT    Radiology: CARDIAC CATHETERIZATION  Result Date: 03/23/2021   Dist RCA lesion is 20% stenosed.   There is mild left ventricular systolic dysfunction.   The left ventricular ejection fraction is 45-50% by visual estimate. 1.   Insignificant coronary artery disease 2.  Mild reduced left ventricular function with estimated LV ejection fraction 45 to 50% Recommendations 1.  Admit to ICU for ventilator management 2.  2D echocardiogram   DG Chest Portable 1 View  Result Date: 03/23/2021 CLINICAL DATA:  Status post intubation. EXAM: PORTABLE CHEST 1 VIEW COMPARISON:  None. FINDINGS: Overlying radiopaque cardiac lead wires and tags are seen. An endotracheal tube is seen with its distal tip approximately 0.9 cm from the carina. Mildly decreased lung volumes are noted. Mild atelectasis and/or infiltrate is seen along the infrahilar region of the right lung base. There is no evidence of a pleural effusion or pneumothorax. The heart size and mediastinal contours are within normal limits. The visualized skeletal structures are unremarkable. IMPRESSION: 1. Endotracheal tube with its distal tip approximately 0.9 cm from the carina. 2. Mild right basilar atelectasis and/or infiltrate. Electronically Signed   By: Aram Candela M.D.   On: 03/23/2021 03:30    EKG: Sinus rhythm 85 bpm with nonspecific ST abnormalities  ASSESSMENT AND PLAN:   1.  Cardiopulmonary arrest, with apparent ventricular fibrillation requiring cardioversion, cardiac catheterization revealing insignificant coronary artery disease and left ventriculography revealing a mildly reduced left ventricular function, which suggests primary respiratory event 2.  Respiratory failure, on ventilator 3.  Essential hypertension 4.  Acute kidney injury with underlying mild CKD  Recommendations  1.  Admit to ICU for ventilator management 2.  2D echocardiogram  Signed: Marcina Millard MD,PhD, [facility] 03/23/2021, 3:59 AM

## 2021-03-23 NOTE — Progress Notes (Signed)
BRIEF PCCM NOTE  Pt was seen evaluated earlier this am by PCCM MD Dr. Lake Bells.  Please see his H&P for full details and full assessment/plan.  Brief Pt Description / Synopsis:  51 year old male with out-of-hospital V. fib cardiac arrest.  Cardiac catheterization without evidence of significant coronary artery disease.  Now with concern for anoxic brain injury.  Subjective / Interval History:  -Patient underwent cardiac cath earlier this morning with no significant coronary artery disease male -Patient neuro exam is concerning for anoxic brain injury -Noted to have rhythmic tongue/lip fasciculations ~we will place on IV Keppra and obtain EEG along with neurology consult  Objective:   Today's Vitals   03/23/21 1600 03/23/21 1615 03/23/21 1700 03/23/21 1800  BP: (!) 158/120 (!) 143/107 (!) 143/111 (!) 148/111  Pulse: 96 90 88 91  Resp: (!) 22 (!) 21 (!) 21 (!) 22  Temp: 98.2 F (36.8 C)  98.2 F (36.8 C) 98.4 F (36.9 C)  TempSrc:    Esophageal  SpO2: 98% 98% 96% 98%  Weight:      Height:       Body mass index is 33.73 kg/m.  Assessment / Plan:   V. Fib arrest, left heart cath without significant coronary artery disease Hypertensive Emergency on admission -Continuous cardiac monitoring -Maintain MAP >65 -Vasopressors as needed to maintain MAP goal -Trend lactic acid until normalized -Trend HS Troponin until peaked -Echocardiogram pending -Cardiology following, appreciate input -Cardiac CATH without significant coronary artery disease -Urine drug screen negative -Serum Ethyl Alcohol negative -Continue Heparin gtt as per Cardiology  Acute Metabolic Encephalopathy post cardiac arrest Pt exhibiting tongue/lip fasciculations concerning for seizure activity Concern for Anoxic Brain Injury -Maintain a RASS goal of 0 to -1 -Propofol and fentanyl as needed to maintain RASS goal -Avoid sedating medications as able -Daily wake up assessment -Normothermia protocol -CT  Head>>IMPRESSION: 1. Difficult without a prior study to exclude the possibility of anoxic injury; questionable loss of cerebral sulci throughout the brain, but the gray-white matter differentiation is preserved. If decreased mental status persists then repeat Head CT in 12-24 hours or noncontrast brain MRI would be valuable. 2. 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 No intracranial hemorrhage or mass effect. 3. Intubated, with mastoid air cell effusions. -EEG pending -IV Keppra -Consult Neurology, appreciate input  Acute Hypoxic Respiratory Failure in the setting of cardiac arrest -Full vent support, implement lung protective strategies -Plateau pressures less than 30 cm H20 -Wean FiO2 & PEEP as tolerated to maintain O2 sats >92% -Follow intermittent Chest X-ray & ABG as needed -Spontaneous Breathing Trials when respiratory parameters met and mental status permits -Implement VAP Bundle -Prn Bronchodilators  Acute Kidney Injury -Monitor I&O's / urinary output -Follow BMP -Ensure adequate renal perfusion -Avoid nephrotoxic agents as able -Replace electrolytes as indicated -Consider Nephrology consult  Mild Leukocytosis, suspect reactive due to cardiac arrest -Monitor fever curve -Trend WBC's & Procalcitonin -Follow cultures as above -Will hold off on empiric ABX as this time  Elevated LFT's, shock liver vs. Alcoholic hepatitis -ETOH level negative -Trend LFT's  Hyperglycemia -CBG's q4h; Target range of 140 to 180 -SSI -Follow ICU Hypo/Hyperglycemia protocol        Pt is critically ill, concern for anoxic brain injury.  Prognosis is guarded.  High risk for further cardiac arrest and death. Recommend DNR status.  Pt's wife updated at bedside. All questions answered to her satisfaction.   Darel Hong, AGACNP-BC [facility] Prefer epic messenger for cross cover needs  If after hours, please call E-link

## 2021-03-23 NOTE — Progress Notes (Signed)
50/M who presents with unresponsiveness. Pt was noted by wife to have agonal respirations while sleeping. She called EMS and started bystander CPR. On EMS arrival, pt was noted to be in Vfib. He was debfibrillated, converted to asystole. ROSC subsequently obtained after a few rounds of CPR. He was brought to the ED where he was intubated for airway protection. ECG revealed nonspecific ST-Twave abnormalities. He was brought to the cath lab emergently, but no signficant CAD was noted on coronary angiography. LVEF estimated to be 45-50%. He was subsequently transferred to the ICU for further management.   Labs and imaging briefly reviewed.   Plan s/p Vfib cardiac arrest - Etiology unclear    - Pt does not have significant coronary disease on angiogram    - With AKI, but otherwise no severe electrolyte abnormalities. No hypoglycemia reported.     - ?PE - although does not appear to be difficult to oxygenate.    - No findings to suggest ongoing infection.      - Check UDS - Currently unresponsive, not triggering vent.     - Check head CT    - Pt may potentially be a candidate for targeted temperature management.     - Serial neurochecks as per protocol.  - Continue other supportive measures:    - Vent management       - Maintain TV 4-40ml/kg PBW, target plateau pressures <30      - Titrate FiO2 and PEEP to maintain SpO2 >90%      - Serial ABG, will make further vent adjustments as appropriate    - Maintain on continuous cardiac monitoring.     - Serial EKG, trend troponin.     - Planned for echo in AM    - Monitor BMP, correct abnormalities as needed.    - Monitor I/Os, daily weights    - Renally dose medications.     - Maintain normoglycemia.

## 2021-03-23 NOTE — ED Triage Notes (Addendum)
Pt from home brought in per EMS for STEMI, per report Pt found by wife with agonal respirations. On arrival to scene, EMS found Pt with Vfib , defibrillated x1, Pt  then converted to asystole. 2 rounds epi given PTA. Arrived in ED w/ LMA in place , ROSC. ERMD at bedside at this time

## 2021-03-23 NOTE — Progress Notes (Signed)
Initial Nutrition Assessment  DOCUMENTATION CODES:   Obesity unspecified  INTERVENTION:   Continue Vital HP @40ml /hr + ProSource TF 1ml TID via tube   Propofol: 16.36 ml/hr- provides 431kcal/day   Free water flushes 78ml q4 hours to maintain tube patency   Regimen provides 1631kcal/day, 150g/day protein and 963ml/day free water   Liquid MVI daily via tube  NUTRITION DIAGNOSIS:   Inadequate oral intake related to inability to eat (pt sedated and ventilated) as evidenced by NPO status.  GOAL:   Provide needs based on ASPEN/SCCM guidelines  MONITOR:   PO intake, Supplement acceptance, Labs, Weight trends, Skin, I & O's  REASON FOR ASSESSMENT:   Consult Enteral/tube feeding initiation and management  ASSESSMENT:   51 y/o male with h/o HTN who is admitted after cardiopulmonary arrest with suspicion for ST elevation myocardial infarction now s/p L heart cath today.  Pt sedated and ventilated. OGT in place. Protocol ordered; pt tolerating tube feeds well. Will resume current tube feeds. Family at bedside reports pt with good oral intake at baseline. Suspect pt is at low refeed risk. Per chart, pt appears weight stable at baseline.    Medications reviewed and include: colace, insulin. Protonix, miralax, heparin, propofol   Labs reviewed: K 3.5 wnl, BUN 26(H), creat 2.08(H), P 2.5 wnl, Mg 2.4 wnl Wbc- 12.6(H) Cbgs- 104, 127, 116, 185, 284 x 24 hrs AIC 5.4- 1/19  Patient is currently intubated on ventilator support MV: 15.0 L/min Temp (24hrs), Avg:98.9 F (37.2 C), Min:97 F (36.1 C), Max:100.4 F (38 C)  Propofol: 16.36 ml/hr- provides 431kcal/day   MAP- >47mmHg   UOP- 77m   NUTRITION - FOCUSED PHYSICAL EXAM:  Flowsheet Row Most Recent Value  Orbital Region No depletion  Upper Arm Region No depletion  Thoracic and Lumbar Region No depletion  Buccal Region No depletion  Temple Region No depletion  Clavicle Bone Region No depletion  Clavicle and Acromion  Bone Region No depletion  Scapular Bone Region No depletion  Dorsal Hand No depletion  Patellar Region No depletion  Anterior Thigh Region No depletion  Posterior Calf Region No depletion  Edema (RD Assessment) None  Hair Reviewed  Eyes Reviewed  Mouth Reviewed  Skin Reviewed  Nails Reviewed   Diet Order:   Diet Order             Diet NPO time specified  Diet effective now                  EDUCATION NEEDS:   No education needs have been identified at this time  Skin:  Skin Assessment: Reviewed RN Assessment  Last BM:  1/19- type 7  Height:   Ht Readings from Last 1 Encounters:  03/23/21 5\' 11"  (1.803 m)    Weight:   Wt Readings from Last 1 Encounters:  03/23/21 109.7 kg    Ideal Body Weight:  78 kg  BMI:  Body mass index is 33.73 kg/m.  Estimated Nutritional Needs:   Kcal:  1206-1535kcal/day  Protein:  >156g/day  Fluid:  2.3-2.6L/day  MS, RD, LDN Please refer to [facility] for RD and/or RD on-call/weekend/after hours pager

## 2021-03-23 NOTE — Consult Note (Signed)
NEUROLOGY CONSULTATION NOTE   Date of service: March 23, 2021 Patient Name: Billy Cherry MRN:  [id number] DOB:  [birth year] Reason for consult: prognostication after cardiac arrest Requesting physician: Dr. Isaias Cowman _ _ _   _ __   _ __ _ _  __ __   _ __   __ _  History of Present Illness   This is a 51 year old gentleman with history of hypertension who is admitted after cardiac arrest on whom neurology is consulted for prognostication.  He was found down by his wife yesterday evening with agonal respirations.  911 was called he received out of hospital CPR, 1 shock with defibrillator, 2 doses of epinephrine.  After ROSC there was concern on his twelve-lead EKG for anterolateral ST elevation so he was taken to the Cath Lab but he was not found to have significant coronary artery disease.  Left ventriculography revealed mild LV dysfunction with LVEF estimated at 45 to 50%. TTE this AM then showed EF <20% and troponin this AM increased from 425 to >24,000. Started on heparin gtt. Creatinine 2.08, AST 150, ALT 130.  Patient does not use illicit drugs, tobacco and rarely drinks alcohol per family.  COVID and flu testing was negative.  Head CT personally reviewed by me did not show any definitive evidence of anoxic brain injury although there was some questionable loss of sulci.  Gray-white matter differentiation was preserved.  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  He has not undergone MRI. Earlier this AM he was noted to have rhythmic twitching of his lips/tongue and was started on keppra and sedation with propofol. Movements resolved.     ROS   UTA 2/2 comatose  Past History   I have reviewed the following:  Past Medical History:  Diagnosis Date   Hypertension    Past Surgical History:  Procedure Laterality Date   BACK SURGERY     CORONARY/GRAFT ACUTE MI REVASCULARIZATION N/A 03/23/2021   Procedure: Coronary/Graft  Acute MI Revascularization;  Surgeon: Isaias Cowman, MD;  Location: [facility];  Service: Cardiovascular;  Laterality: N/A;   LEFT HEART CATH AND CORONARY ANGIOGRAPHY N/A 03/23/2021   Procedure: LEFT HEART CATH AND CORONARY ANGIOGRAPHY;  Surgeon: Isaias Cowman, MD;  Location: [facility];  Service: Cardiovascular;  Laterality: N/A;   SHOULDER SURGERY     No family history on file. Social History   Socioeconomic History   Marital status: Married    Spouse name: Not on file   Number of children: Not on file   Years of education: Not on file   Highest education level: Not on file  Occupational History   Not on file  Tobacco Use   Smoking status: Never   Smokeless tobacco: Never  Substance and Sexual Activity   Alcohol use: Yes   Drug use: Never   Sexual activity: Not on file  Other Topics Concern   Not on file  Social History Narrative   Not on file   Social Determinants of Health   Financial Resource Strain: Not on file  Food Insecurity: Not on file  Transportation Needs: Not on file  Physical Activity: Not on file  Stress: Not on file  Social Connections: Not on file   No Known Allergies  Medications   Medications Prior to Admission  Medication Sig Dispense Refill Last Dose   lisinopril (PRINIVIL,ZESTRIL) 10 MG tablet Take 10 mg by mouth daily.  03/22/2021   sildenafil (VIAGRA) 25 MG tablet Take by mouth as directed.   Past Month   lisinopril (ZESTRIL) 10 MG tablet Take 1 tablet (10 mg total) by mouth daily. 30 tablet 1       Current Facility-Administered Medications:    0.9 %  sodium chloride infusion, 250 mL, Intravenous, PRN, Paraschos, Alexander, MD   0.9 %  sodium chloride infusion, 250 mL, Intravenous, Continuous, McQuaid, Douglas B, MD, Held at 03/23/21 0445   0.9% sodium chloride infusion, 1 mL/kg/hr, Intravenous, Continuous, Paraschos, Alexander, MD, Last Rate: 109.7 mL/hr at 03/23/21 1230, 1 mL/kg/hr at  03/23/21 1230   acetaminophen (TYLENOL) tablet 650 mg, 650 mg, Oral, Q4H **OR** acetaminophen (TYLENOL) 160 MG/5ML solution 650 mg, 650 mg, Per Tube, Q4H, 650 mg at 03/23/21 0916 **OR** acetaminophen (TYLENOL) suppository 650 mg, 650 mg, Rectal, Q4H, McQuaid, Douglas B, MD   busPIRone (BUSPAR) tablet 30 mg, 30 mg, Oral, Q8H PRN **OR** busPIRone (BUSPAR) tablet 30 mg, 30 mg, Per Tube, Q8H PRN, Simonne Maffucci B, MD   Chlorhexidine Gluconate Cloth 2 % PADS 6 each, 6 each, Topical, Q0600, Paraschos, Alexander, MD, 6 each at 03/23/21 0430   docusate (COLACE) 50 MG/5ML liquid 100 mg, 100 mg, Per Tube, BID, Simonne Maffucci B, MD, 100 mg at 03/23/21 0916   feeding supplement (PROSource TF) liquid 90 mL, 90 mL, Per Tube, TID, Nelle Don, MD   feeding supplement (VITAL HIGH PROTEIN) liquid 1,000 mL, 1,000 mL, Per Tube, Q24H, McQuaid, Douglas B, MD, 1,000 mL at 03/23/21 0625   fentaNYL (SUBLIMAZE) bolus via infusion 50-100 mcg, 50-100 mcg, Intravenous, Q15 min PRN, Simonne Maffucci B, MD   fentaNYL (SUBLIMAZE) injection 50 mcg, 50 mcg, Intravenous, Once, Simonne Maffucci B, MD   fentaNYL 2585mcg in NS 250mL (31mcg/ml) infusion-PREMIX, 50-200 mcg/hr, Intravenous, Continuous, McQuaid, Douglas B, MD   heparin ADULT infusion 100 units/mL (25000 units/262mL), 1,300 Units/hr, Intravenous, Continuous, Paraschos, Alexander, MD, Last Rate: 13 mL/hr at 03/23/21 1100, 1,300 Units/hr at 03/23/21 1100   insulin aspart (novoLOG) injection 0-15 Units, 0-15 Units, Subcutaneous, Q4H, McQuaid, Douglas B, MD, 3 Units at 03/23/21 0800   iohexol (OMNIPAQUE) 300 MG/ML solution, , , PRN, Paraschos, Alexander, MD, 105 mL at 03/23/21 0406   levETIRAcetam (KEPPRA) IVPB 1000 mg/100 mL premix, 1,000 mg, Intravenous, Once, Derek Jack, MD   levETIRAcetam (KEPPRA) IVPB 500 mg/100 mL premix, 500 mg, Intravenous, Q12H, Darel Hong D, NP   magnesium sulfate IVPB 2 g 50 mL, 2 g, Intravenous, Once PRN, Simonne Maffucci B, MD   midazolam (VERSED) injection 2 mg, 2 mg, Intravenous, Once, Bradly Bienenstock, NP   Derrill Memo ON 03/24/2021] multivitamin liquid 15 mL, 15 mL, Per Tube, Daily, Nelle Don, MD   norepinephrine (LEVOPHED) 4mg  in 238mL (0.016 mg/mL) premix infusion, 2-10 mcg/min, Intravenous, Titrated, Juanito Doom, MD, Held at 03/23/21 0524   ondansetron (ZOFRAN) injection 4 mg, 4 mg, Intravenous, Q6H PRN, Paraschos, Alexander, MD   pantoprazole (PROTONIX) injection 40 mg, 40 mg, Intravenous, QHS, McQuaid, Douglas B, MD   polyethylene glycol (MIRALAX / GLYCOLAX) packet 17 g, 17 g, Per Tube, Daily, McQuaid, Douglas B, MD, 17 g at 03/23/21 0916   propofol (DIPRIVAN) 1000 MG/100ML infusion, 5-80 mcg/kg/min, Intravenous, Continuous, Paraschos, Alexander, MD, Last Rate: 16.46 mL/hr at 03/23/21 1230, 25 mcg/kg/min at 03/23/21 1230   sodium chloride flush (NS) 0.9 % injection 3 mL, 3 mL, Intravenous, Q12H, Paraschos, Alexander, MD   sodium chloride flush (NS) 0.9 % injection 3 mL, 3  mL, Intravenous, PRN, Isaias Cowman, MD  Vitals   Vitals:   03/23/21 1157 03/23/21 1230 03/23/21 1300 03/23/21 1330  BP:      Pulse:      Resp:      Temp: 99.5 F (37.5 C) 98.8 F (37.1 C) 98.4 F (36.9 C) 98.2 F (36.8 C)  TempSrc:      SpO2:      Weight:      Height:         Body mass index is 33.73 kg/m.  Physical Exam   Propofol paused x25 min prior to examination  Physical Exam Gen: obtunded, briefly opens eyes to vigorous noxious stimuli Resp: ventilated CV: RRR  Neuro: GCS E2/V not testable/M1 *MS: obtunded, briefly opens eyes to vigorous noxious stimuli. Does not follow commands *Speech: intubated *CN: 3mm ERRL, (+) corneals and oculocephalics, (-) cough and gag, face symmetric at rest *Motor & sensory: no spontaneous movement; no motor response to noxious stimuli ina ny extremity, grimaces to noxious stimuli in RUE only *Reflexes: 1+ symmetric throughout toes mute  bilat *Coordination, gait: UTA  Labs   CBC:  Recent Labs  Lab 03/23/21 0254  WBC 12.6*  NEUTROABS 5.8  HGB 14.8  HCT 45.2  MCV 93.2  PLT [id number]    Basic Metabolic Panel:  Lab Results  Component Value Date   NA 138 03/23/2021   K 3.5 03/23/2021   CO2 20 (L) 03/23/2021   GLUCOSE 249 (H) 03/23/2021   BUN 26 (H) 03/23/2021   CREATININE 2.08 (H) 03/23/2021   CALCIUM 8.4 (L) 03/23/2021   GFRNONAA 38 (L) 03/23/2021   Lipid Panel:  Lab Results  Component Value Date   LDLCALC 174 (H) 03/23/2021   HgbA1c:  Lab Results  Component Value Date   HGBA1C 5.4 03/23/2021   Urine Drug Screen:     Component Value Date/Time   LABOPIA NONE DETECTED 03/23/2021 0528   COCAINSCRNUR NONE DETECTED 03/23/2021 0528   LABBENZ NONE DETECTED 03/23/2021 0528   AMPHETMU NONE DETECTED 03/23/2021 0528   THCU NONE DETECTED 03/23/2021 0528   LABBARB NONE DETECTED 03/23/2021 0528    Alcohol Level     Component Value Date/Time   [facility] <10 03/23/2021 [id number]     Impression   This is a 51 year old gentleman with history of hypertension who is admitted after cardiac arrest 03/22/21 on whom neurology is consulted for prognostication.  Etiology of arrest is unclear, given insignificant CAD on cath suspect primary respiratory event vs arrhythmia. He now does have severely depressed EF (not present on left ventriculography during cath) and is on heparin gtt for NSTEMI. He has some brainstem reflexes on exam and does flutter eyes in response to noxious stimuli but does not follow commands or have any motor response in his extremities to pain. He will need a MRI brain for further evaluation of e/o anoxic injury when he is stable to undergo it. When he is hemodynamically stable sedation should be weaned to allow for formal prognostication exam. He is not having any twitching on my exam, recommend continuation of keppra for now. EEG pending.  Recommendations   - MRI brain wo contrast when stable to undergo it -  Continue keppra 500mg  bid - rEEG - Wean sedation as able - Will continue to follow ______________________________________________________________________  This patient is critically ill and at significant risk of neurological worsening, death and care requires constant monitoring of vital signs, hemodynamics,respiratory and cardiac monitoring, neurological assessment, discussion with family, other specialists and medical  decision making of high complexity. I spent 75 minutes of neurocritical care time  in the care of  this patient. This was time spent independent of any time provided by nurse practitioner or PA.  Su Monks, MD [facility] [phone]  If 7pm- 7am, please page neurology on call as listed in [place].

## 2021-03-23 NOTE — Progress Notes (Signed)
Code STMI 230am. Pt was being worked on in ED when Ch arrived. Spouse arrived separately, Ch stayed with spouse as Pt taken to cath lab. Pt stabilized and now in ICU.

## 2021-03-23 NOTE — Progress Notes (Signed)
Late entry: approximately 0300, called stat to emergency department for intubation. Patient intubated and transported to cath lab. After procedure completed, transported patient to ICU with no complications noted.

## 2021-03-23 NOTE — Progress Notes (Signed)
*  PRELIMINARY RESULTS* Echocardiogram 2D Echocardiogram has been performed.  Billy Cherry 03/23/2021, 12:00 PM

## 2021-03-23 NOTE — Progress Notes (Signed)
[facility] Cardiology  CARDIOLOGY CONSULT NOTE  Patient ID: Billy Cherry MRN: [id number] DOB/AGE: 04-28-1970 50 y.o.  Admit date: 03/23/2021 Referring Physician [facility] Primary Physician Mena Goes [facility] family med (last seen 07/2019) Primary Cardiologist  Reason for Consultation cardiopulmonary arrest  HPI: 51 year old gentleman with a PMH significant for hypertension and hyperlipidemia referred for cardiopulmonary arrest with suspicion for ST elevation myocardial infarction.  Patient has a history of essential hypertension.  Patient was sleeping, found by his wife agonal respirations, called EMS, and performed bystander CPR.  On EMS arrival patient was found to be in ventricular fibrillation and defibrillated x1, converted to asystole given 2 rounds of epinephrine.  Post defibrillation ECG revealed sinus tachycardia with right bundle branch block and apparent ST elevation in anterolateral leads.  Following arrival at [facility] ED, patient was intubated.  ECG in the ED revealed minus rhythm 85 bpm with nonspecific ST-T abnormalities the anterolateral leads.  The patient was brought to the cardiac catheterization laboratory coronary angiography revealed insignificant coronary artery disease.  Left ventriculography revealed mild left ventricular dysfunction with estimated LVEF 45 to 50%.  Interval history: -remains intubated and sedated, requiring Levophed and propofol -cardiac cath performed early this morning by Dr. Saralyn Pilar revealed insignificant CAD -echocardiogram resulted this morning with LVEF less than 20%, global hypokinesis, severely decreased LV function, mild LV internal cavity dilation, mild LVH.  Grade 2 diastolic dysfunction.  Review of systems unable to be assessed due to patient's sedation.    Past Medical History:  Diagnosis Date   Hypertension     Past Surgical History:  Procedure Laterality Date   BACK SURGERY     CORONARY/GRAFT ACUTE MI REVASCULARIZATION N/A 03/23/2021    Procedure: Coronary/Graft Acute MI Revascularization;  Surgeon: Isaias Cowman, MD;  Location: [facility];  Service: Cardiovascular;  Laterality: N/A;   LEFT HEART CATH AND CORONARY ANGIOGRAPHY N/A 03/23/2021   Procedure: LEFT HEART CATH AND CORONARY ANGIOGRAPHY;  Surgeon: Isaias Cowman, MD;  Location: [facility];  Service: Cardiovascular;  Laterality: N/A;   SHOULDER SURGERY      Medications Prior to Admission  Medication Sig Dispense Refill Last Dose   lisinopril (PRINIVIL,ZESTRIL) 10 MG tablet Take 10 mg by mouth daily.    03/22/2021   sildenafil (VIAGRA) 25 MG tablet Take by mouth as directed.   Past Month   lisinopril (ZESTRIL) 10 MG tablet Take 1 tablet (10 mg total) by mouth daily. 30 tablet 1     Social History   Socioeconomic History   Marital status: Married    Spouse name: Not on file   Number of children: Not on file   Years of education: Not on file   Highest education level: Not on file  Occupational History   Not on file  Tobacco Use   Smoking status: Never   Smokeless tobacco: Never  Substance and Sexual Activity   Alcohol use: Yes   Drug use: Never   Sexual activity: Not on file  Other Topics Concern   Not on file  Social History Narrative   Not on file   Social Determinants of Health   Financial Resource Strain: Not on file  Food Insecurity: Not on file  Transportation Needs: Not on file  Physical Activity: Not on file  Stress: Not on file  Social Connections: Not on file  Intimate Partner Violence: Not on file    No family history on file.   Review of systems complete and found to be negative unless listed above  PHYSICAL EXAM General: Black male intubated and sedated, eyes closed.  Wife and aunt at bedside HEENT:  Normocephalic and atramatic Neck:  No JVD.  Lungs: intubated. Clear bilaterally to auscultation anteriorly Heart: HRRR . Normal S1 and S2 without gallops or murmurs.  Abdomen: Nondistended  appearing Msk: Unable to assess Extremities: No clubbing, cyanosis or edema.   Neuro: Unable to assess Psych: Unable to assess  Labs:   Lab Results  Component Value Date   WBC 12.6 (H) 03/23/2021   HGB 14.8 03/23/2021   HCT 45.2 03/23/2021   MCV 93.2 03/23/2021   PLT 228 03/23/2021    Recent Labs  Lab 03/23/21 0254  NA 138  K 3.5  CL 104  CO2 20*  BUN 26*  CREATININE 2.08*  CALCIUM 8.4*  PROT 7.2  BILITOT 1.2  ALKPHOS 74  ALT 130*  AST 150*  GLUCOSE 249*    No results found for: CKTOTAL, CKMB, CKMBINDEX, TROPONINI  Lab Results  Component Value Date   CHOL 254 (H) 03/23/2021   Lab Results  Component Value Date   HDL 52 03/23/2021   Lab Results  Component Value Date   LDLCALC 174 (H) 03/23/2021   Lab Results  Component Value Date   TRIG 138 03/23/2021   Lab Results  Component Value Date   CHOLHDL 4.9 03/23/2021   No results found for: LDLDIRECT    Radiology: DG Chest 1 View  Result Date: 03/23/2021 CLINICAL DATA:  51 year old male status post cardiopulmonary arrest, out of hospital CPR. Altered mental status. EXAM: CHEST  1 VIEW COMPARISON:  Portable chest 0308 hours. FINDINGS: Portable AP upright view at 0458 hours. Endotracheal tube tip in good position between the clavicles and carina. Enteric tube terminates in the left upper quadrant. Pacer pads over the left lower chest. Normal cardiac size and mediastinal contours. Allowing for portable technique the lungs are clear. Visible bowel-gas within normal limits. No displaced rib fracture. IMPRESSION: 1. Satisfactory ET tube and enteric tube. 2.  No acute cardiopulmonary abnormality. Electronically Signed   By: Genevie Ann M.D.   On: 03/23/2021 06:05   DG Abd 1 View  Result Date: 03/23/2021 CLINICAL DATA:  Evaluate OG tube placement. EXAM: ABDOMEN - 1 VIEW COMPARISON:  None. FINDINGS: The OG tube courses below the level of the GE junction. The tip of the OG tube is in the expected location of the gastric  fundus with side port well below the GE junction. Single borderline dilated loop of small bowel noted in the left lower quadrant of the abdomen with normal caliber colon. IMPRESSION: OG tube tip is in the gastric fundus with side port well below the GE junction. Electronically Signed   By: Kerby Moors M.D.   On: 03/23/2021 05:16   CT HEAD WO CONTRAST (5MM)  Result Date: 03/23/2021 CLINICAL DATA:  51 year old male status post cardiopulmonary arrest, out of hospital CPR. Altered mental status. EXAM: CT HEAD WITHOUT CONTRAST TECHNIQUE: Contiguous axial images were obtained from the base of the skull through the vertex without intravenous contrast. RADIATION DOSE REDUCTION: This exam was performed according to the departmental dose-optimization program which includes automated exposure control, adjustment of the mA and/or kV according to patient size and/or use of iterative reconstruction technique. COMPARISON:  None. FINDINGS: Brain: Small round hypodensity at the genu of the left internal capsule on series 2, image 15. Elsewhere gray-white matter differentiation is preserved and within normal limits. However, there is questionable generalized loss of cerebral sulci. But basilar cisterns  appear normal. Normal cerebral volume. No midline shift, ventriculomegaly, mass effect, evidence of mass lesion, intracranial hemorrhage or evidence of cortically based acute infarction. Vascular: Mild generalized intracranial vascular hyperdensity, perhaps related to hemoconcentration. No suspicious intracranial vascular hyperdensity. Skull: No acute osseous abnormality identified. Sinuses/Orbits: Intubated on the scout view. Fluid in the visible pharynx. Right mastoid effusion. Right tympanic cavity remains clear. Mild left mastoid effusion. Paranasal sinuses remain well aerated. Other: No acute orbit or scalp soft tissue finding. IMPRESSION: 1. Difficult without a prior study to exclude the possibility of anoxic injury;  questionable loss of cerebral sulci throughout the brain, but the gray-white matter differentiation is preserved. If decreased mental status persists then repeat Head CT in 12-24 hours or noncontrast brain MRI would be valuable. 2. 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 No intracranial hemorrhage or mass effect. 3. Intubated, with mastoid air cell effusions. Electronically Signed   By: Genevie Ann M.D.   On: 03/23/2021 06:04   CARDIAC CATHETERIZATION  Result Date: 03/23/2021   Dist RCA lesion is 20% stenosed.   There is mild left ventricular systolic dysfunction.   The left ventricular ejection fraction is 45-50% by visual estimate. 1.  Insignificant coronary artery disease 2.  Mild reduced left ventricular function with estimated LV ejection fraction 45 to 50% Recommendations 1.  Admit to ICU for ventilator management 2.  2D echocardiogram   DG Chest Portable 1 View  Result Date: 03/23/2021 CLINICAL DATA:  Status post intubation. EXAM: PORTABLE CHEST 1 VIEW COMPARISON:  None. FINDINGS: Overlying radiopaque cardiac lead wires and tags are seen. An endotracheal tube is seen with its distal tip approximately 0.9 cm from the carina. Mildly decreased lung volumes are noted. Mild atelectasis and/or infiltrate is seen along the infrahilar region of the right lung base. There is no evidence of a pleural effusion or pneumothorax. The heart size and mediastinal contours are within normal limits. The visualized skeletal structures are unremarkable. IMPRESSION: 1. Endotracheal tube with its distal tip approximately 0.9 cm from the carina. 2. Mild right basilar atelectasis and/or infiltrate. Electronically Signed   By: Virgina Norfolk M.D.   On: 03/23/2021 03:30    EKG: Sinus rhythm 85 bpm with nonspecific ST abnormalities  Telemetry reviewed by me: sinus tachycardia, rate 111  Echocardiogram 03/23/2020  1. Left ventricular ejection fraction, by estimation, is <20%. The left  ventricle has  severely decreased function. The left ventricle demonstrates  global hypokinesis. The left ventricular internal cavity size was mildly  dilated. There is mild left  ventricular hypertrophy. Left ventricular diastolic parameters are  consistent with Grade II diastolic dysfunction (pseudonormalization).   2. Right ventricular systolic function is normal. The right ventricular  size is normal.   3. The mitral valve is normal in structure. Trivial mitral valve  regurgitation.   4. The aortic valve is normal in structure. Aortic valve regurgitation is  not visualized.   ASSESSMENT AND PLAN:   #Cardiopulmonary arrest -suspected 2/2 ventricular fibrillation requiring cardioversion, cardiac catheterization revealing insignificant coronary artery disease  -Currently requiring levophed gtt, sedation with propofol -echocardiogram resulted with LVEF less than 20%, global hypokinesis, severely decreased LV function. -No further cardiac diagnostics necessary at this time.  We will advance GDMT as appropriate in patient's clinical course however prognosis is unclear. -Neurology consulted by primary team for concern of anoxic brain injury, appreciate their recommendations  #Respiratory failure -Requiring ventilatory support, supportive care as per primary team  #Essential hypertension -Holding home meds which include losartan-HCTZ 50-25mg   #  Acute kidney injury with underlying mild CKD -Cr 2.08 and GFR 38 on admission  This patient's plan of care was discussed and created with Dr. Serafina Royals and he is in agreement.  Signed: Alanson Puls Breea Loncar PA-C 03/23/2021, 8:11 AM

## 2021-03-23 NOTE — ED Notes (Addendum)
Pt transported to cath lab.  

## 2021-03-23 NOTE — ED Provider Notes (Signed)
[facility] Provider Note    None    (approximate)   History   Code STEMI   HPI  Billy Cherry is a 51 y.o. male with hypertension who comes in as a code STEMI.  Patient's wife found him unresponsive where he was agonal he breathing called EMS.  Try to do CPR but states that it was not as hard as when EMS got there.  EMS got there within 5 to 10 minutes.  Patient was noted to be in A. fib.  Received 1 shock went into asystole.  Had 2 rounds of epi.  Went into normal sinus.  EKG was concerning for STEMI therefore they activated the Cath Lab.  According to wife he has no history of drug abuse.  He has a history of hypertension only.  He was feeling fine earlier today.  Unable to get full HPI due to patient being nonresponsive.   Physical Exam   Triage Vital Signs: ED Triage Vitals  Enc Vitals Group     BP 03/23/21 0300 (!) 201/130     Pulse Rate 03/23/21 0255 89     Resp 03/23/21 0300 19     Temp --      Temp src --      SpO2 03/23/21 0255 100 %     Weight --      Height --      Head Circumference --      Peak Flow --      Pain Score --      Pain Loc --      Pain Edu? --      Excl. in [place]? --     Most recent vital signs: Vitals:   03/23/21 0255 03/23/21 0300  BP:  (!) 201/130  Pulse: 89 84  Resp:  19  SpO2: 100% 100%     General: Patient is obtunded, spontaneously breathing with King airway CV:  Good peripheral perfusion.  Resp:  Normal effort.  Clear lungs, breathing on his own but Edison Pace airway is in place Abd:  No distention.   Pupils are 2-1 reactive bilaterally.  Patient is nonresponsive to pain in his arms or legs.  However he is spontaneously breathing.   ED Results / Procedures / Treatments   Labs (all labs ordered are listed, but only abnormal results are displayed) Labs Reviewed  CBC WITH DIFFERENTIAL/PLATELET - Abnormal; Notable for the following components:      Result Value   WBC 12.6 (*)    Lymphs Abs 5.6 (*)     Abs Immature Granulocytes 0.39 (*)    All other components within normal limits  COMPREHENSIVE METABOLIC PANEL - Abnormal; Notable for the following components:   CO2 20 (*)    Glucose, Bld 249 (*)    BUN 26 (*)    Creatinine, Ser 2.08 (*)    Calcium 8.4 (*)    AST 150 (*)    ALT 130 (*)    GFR, Estimated 38 (*)    All other components within normal limits  LIPID PANEL - Abnormal; Notable for the following components:   Cholesterol 254 (*)    LDL Cholesterol 174 (*)    All other components within normal limits  BLOOD GAS, ARTERIAL - Abnormal; Notable for the following components:   pO2, Arterial 212 (*)    All other components within normal limits  CBG MONITORING, ED - Abnormal; Notable for the following components:   Glucose-Capillary 284 (*)  All other components within normal limits  TROPONIN I (HIGH SENSITIVITY) - Abnormal; Notable for the following components:   Troponin I (High Sensitivity) 425 (*)    All other components within normal limits  RESP PANEL BY RT-PCR (FLU A&B, COVID) ARPGX2  MRSA NEXT GEN BY PCR, NASAL  PROTIME-INR  APTT  URINE DRUG SCREEN, QUALITATIVE ([facility] ONLY)  HEMOGLOBIN A1C  MAGNESIUM  MAGNESIUM  PHOSPHORUS  PHOSPHORUS  HEMOGLOBIN A1C  ETHANOL  TROPONIN I (HIGH SENSITIVITY)     EKG  My interpretation of EKG: His initial EKG from EMS was very concerning with diffuse ST elevations in the inferior lateral leads.  His repeat EKG with [place] is not crossing over but I did review it prior to going and he did not have any significant ST elevation or T wave inversions and he had normal intervals   RADIOLOGY I have reviewed the xray personally and agree with radiology read ET tube in place without any evidence of pneumothorax   PROCEDURES:  Critical Care performed: Yes, see critical care procedure note(s)  .1-3 Lead EKG Interpretation Performed by: Vanessa Rosita, MD Authorized by: Vanessa [place], MD     Interpretation: abnormal     ECG rate:   100s   ECG rate assessment: tachycardic     Rhythm: sinus tachycardia     Ectopy: none     Conduction: normal   .Critical Care Performed by: Vanessa [place], MD Authorized by: Vanessa Berne, MD   Critical care provider statement:    Critical care time (minutes):  20   Critical care was necessary to treat or prevent imminent or life-threatening deterioration of the following conditions:  Respiratory failure   Critical care was time spent personally by me on the following activities:  Development of treatment plan with patient or surrogate, discussions with consultants, evaluation of patient's response to treatment, examination of patient, ordering and review of laboratory studies, ordering and review of radiographic studies, ordering and performing treatments and interventions, pulse oximetry, re-evaluation of patient's condition and review of old charts Procedure Name: Intubation Date/Time: 03/23/2021 6:46 AM Performed by: Vanessa Hering[place], MD Pre-anesthesia Checklist: Patient identified Oxygen Delivery Method: Ambu bag Preoxygenation: Pre-oxygenation with 100% oxygen Induction Type: IV induction Grade View: Grade I Tube size: 7.5 mm Number of attempts: 1 Airway Equipment and Method: Video-laryngoscopy Placement Confirmation: ETT inserted through vocal cords under direct vision Difficulty Due To: Difficulty was unanticipated      MEDICATIONS ORDERED IN ED: Medications  propofol (DIPRIVAN) 1000 MG/100ML infusion (40 mcg/kg/min Intravenous New Bag/Given 03/23/21 0309)  rocuronium (ZEMURON) injection 100 mg (100 mg Intravenous Given 03/23/21 0302)  etomidate (AMIDATE) injection 20 mg (20 mg Intravenous Given 03/23/21 0302)     IMPRESSION / MDM / [place] / ED COURSE  I reviewed the triage vital signs and the nursing notes.   Differential diagnosis includes, but is not limited to, arrhythmia, ACS, lower suspicion for intracranial hemorrhage or dissection.  Cath Lab  activated from the field due to concerning EKG however repeat EKG here is not as concerning but given patient's V. fib arrest in a 51 year old patient was still sent to the Cath Lab. Dr. Saralyn Pilar at bedside and we discussed the case.   Patient was intubated with IV roc and etomidate.  Patient was started on propofol postintubation  Chest x-ray had no evidence of pneumonia  Cbc show no evidence of anemia His troponin was elevated but this could just be demand No evidence  of coagulopathy on his coags His kidney function is slightly elevated probably from the arrest and hypoxia   The patient is on the cardiac monitor to evaluate for evidence of arrhythmia and/or significant heart rate changes. FINAL CLINICAL IMPRESSION(S) / ED DIAGNOSES   Final diagnoses:  Acute respiratory failure with hypoxia ([place])  Cardiac arrest ([place])     Rx / DC Orders   ED Discharge Orders     None        Note:  This document was prepared using Dragon voice recognition software and may include unintentional dictation errors.   Vanessa Armstrong, MD 03/23/21 [phone]

## 2021-03-23 NOTE — Progress Notes (Signed)
Received protocol consult for US guided IV due to vasopressor order. Per RN pt is currently not receiving this medication. Requested RN to consult if medication is needed.

## 2021-03-23 NOTE — Procedures (Signed)
Routine EEG Report  Corydon Schweiss is a 51 y.o. male with a history of cardiac arrest who is undergoing an EEG to evaluate for seizures.  Report: This EEG was acquired with electrodes placed according to the International 10-20 electrode system (including Fp1, Fp2, F3, F4, C3, C4, P3, P4, O1, O2, T3, T4, T5, T6, A1, A2, Fz, Cz, Pz). The following electrodes were missing or displaced: none.  There was no clear occipital dominant rhythm. The best background was low amplitude theta which was reactive to stimulation. At the beginning of the recording propofol had just been paused and there were intervening periods of diffuse suppression. Towards the end of the recording as propofol wore off background became near continuous. There was no focal slowing. There was no sleep architecture observed. There were no interictal epileptiform discharges. There were no electrographic seizures identified. Photic stimulation and hyperventilation were not performed.   Impression and clinical correlation: This EEG was obtained while comatose and is abnormal due to: - Moderate diffuse slowing indicative of global cerebral dysfunction - Initial burst suppression pattern which abated as propofol wore off and is favored to be largely (or entirely) secondary to medication effect  Bing Neighbors, MD [facility] [phone]  If 7pm- 7am, please page neurology on call as listed in [facility].

## 2021-03-23 NOTE — Progress Notes (Signed)
Chaplain Maggie offered social support and prayer. Pt's family present at bedside and open to support. Continued care available per on call chaplain.

## 2021-03-23 NOTE — Progress Notes (Signed)
ANTICOAGULATION CONSULT NOTE  Pharmacy Consult for Heparin  Indication: NSTEMI/ACS  Patient Measurements: Height: 5\' 11"  (180.3 cm) Weight: 109.7 kg (241 lb 13.5 oz) IBW/kg (Calculated) : 75.3 Heparin Dosing Weight: 98.8 kg   Labs: Recent Labs    03/23/21 0254 03/23/21 0623 03/23/21 0927 03/23/21 1416  HGB 14.8  --   --   --   HCT 45.2  --   --   --   PLT 228  --   --   --   APTT 29  --   --   --   LABPROT 13.5  --   --   --   INR 1.0  --   --   --   HEPARINUNFRC  --   --   --  0.43  CREATININE 2.08*  --   --   --   TROPONINIHS 425* >24,000* >24,000*  --      Estimated Creatinine Clearance: 53.5 mL/min (A) (by C-G formula based on SCr of 2.08 mg/dL (H)).   Medical History: Past Medical History:  Diagnosis Date   Hypertension    Assessment: Pharmacy consulted to dose heparin in this 51 year old male admitted with Vfib cardiac arrest. Cardiac catheterization without significant CAD. ECHO showed systolic heart failure with EF < 20%.  No prior anticoag noted.   Goal of Therapy:  Heparin level 0.3-0.7 units/ml Monitor platelets by anticoagulation protocol: Yes   Plan:  --Heparin level is therapeutic x 1 --Maintain heparin infusion at current rate of 1300 units/hr --Re-check confirmatory HL in 6 hours --Daily CBC per protocol while on IV heparin  44 03/23/2021,2:59 PM

## 2021-03-23 NOTE — ED Notes (Addendum)
Cardiologist arrived

## 2021-03-23 NOTE — Progress Notes (Signed)
Eeg done 

## 2021-03-24 ENCOUNTER — Inpatient Hospital Stay: Payer: BC Managed Care – PPO

## 2021-03-24 DIAGNOSIS — G931 Anoxic brain damage, not elsewhere classified: Secondary | ICD-10-CM

## 2021-03-24 DIAGNOSIS — I4901 Ventricular fibrillation: Principal | ICD-10-CM

## 2021-03-24 LAB — MAGNESIUM
Magnesium: 2.3 mg/dL (ref 1.7–2.4)
Magnesium: 2.2 mg/dL (ref 1.7–2.4)

## 2021-03-24 LAB — COMPREHENSIVE METABOLIC PANEL
ALT: 92 U/L — ABNORMAL HIGH (ref 0–44)
AST: 123 U/L — ABNORMAL HIGH (ref 15–41)
Albumin: 3.5 g/dL (ref 3.5–5.0)
Alkaline Phosphatase: 43 U/L (ref 38–126)
Anion gap: 8 (ref 5–15)
BUN: 38 mg/dL — ABNORMAL HIGH (ref 6–20)
CO2: 26 mmol/L (ref 22–32)
Calcium: 8.4 mg/dL — ABNORMAL LOW (ref 8.9–10.3)
Chloride: 105 mmol/L (ref 98–111)
Creatinine, Ser: 1.91 mg/dL — ABNORMAL HIGH (ref 0.61–1.24)
GFR, Estimated: 42 mL/min — ABNORMAL LOW (ref >60)
Glucose, Bld: 95 mg/dL (ref 70–99)
Potassium: 3.6 mmol/L (ref 3.5–5.1)
Sodium: 139 mmol/L (ref 135–145)
Total Bilirubin: 1.1 mg/dL (ref 0.3–1.2)
Total Protein: 7.1 g/dL (ref 6.5–8.1)

## 2021-03-24 LAB — CBC
HCT: 47.9 % (ref 39.0–52.0)
Hemoglobin: 15.7 g/dL (ref 13.0–17.0)
MCH: 30.1 pg (ref 26.0–34.0)
MCHC: 32.8 g/dL (ref 30.0–36.0)
MCV: 91.9 fL (ref 80.0–100.0)
Platelets: 193 10*3/uL (ref 150–400)
RBC: 5.21 MIL/uL (ref 4.22–5.81)
RDW: 13.8 % (ref 11.5–15.5)
WBC: 15.3 10*3/uL — ABNORMAL HIGH (ref 4.0–10.5)
nRBC: 0 % (ref 0.0–0.2)

## 2021-03-24 LAB — GLUCOSE, CAPILLARY
Glucose-Capillary: 101 mg/dL — ABNORMAL HIGH (ref 70–99)
Glucose-Capillary: 106 mg/dL — ABNORMAL HIGH (ref 70–99)
Glucose-Capillary: 107 mg/dL — ABNORMAL HIGH (ref 70–99)
Glucose-Capillary: 110 mg/dL — ABNORMAL HIGH (ref 70–99)
Glucose-Capillary: 113 mg/dL — ABNORMAL HIGH (ref 70–99)
Glucose-Capillary: 96 mg/dL (ref 70–99)

## 2021-03-24 LAB — BLOOD GAS, ARTERIAL
Acid-Base Excess: 0 mmol/L (ref 0.0–2.0)
Bicarbonate: 23.9 mmol/L (ref 20.0–28.0)
FIO2: 0.3
MECHVT: 500 mL
O2 Saturation: 98.5 %
PEEP: 5 cmH2O
Patient temperature: 37
RATE: 20 resp/min
pCO2 arterial: 36 mmHg (ref 32.0–48.0)
pH, Arterial: 7.43 (ref 7.350–7.450)
pO2, Arterial: 111 mmHg — ABNORMAL HIGH (ref 83.0–108.0)

## 2021-03-24 LAB — TROPONIN I (HIGH SENSITIVITY): Troponin I (High Sensitivity): 8693 ng/L (ref <18)

## 2021-03-24 LAB — PHOSPHORUS
Phosphorus: 4.2 mg/dL (ref 2.5–4.6)
Phosphorus: 5 mg/dL — ABNORMAL HIGH (ref 2.5–4.6)

## 2021-03-24 LAB — TRIGLYCERIDES: Triglycerides: 142 mg/dL (ref <150)

## 2021-03-24 MED ORDER — FENTANYL CITRATE PF 50 MCG/ML IJ SOSY
50.0000 ug | PREFILLED_SYRINGE | INTRAMUSCULAR | Status: DC | PRN
  Filled 2021-03-24: qty 1

## 2021-03-24 MED ORDER — SCOPOLAMINE 1 MG/3DAYS TD PT72
1.0000 | MEDICATED_PATCH | TRANSDERMAL | Status: DC
  Administered 2021-03-24: 1.5 mg via TRANSDERMAL
  Filled 2021-03-24: qty 1

## 2021-03-24 MED ORDER — CHLORHEXIDINE GLUCONATE 0.12% ORAL RINSE (MEDLINE KIT)
15.0000 mL | Freq: Two times a day (BID) | OROMUCOSAL | Status: DC
  Administered 2021-03-24 – 2021-03-30 (×13): 15 mL via OROMUCOSAL

## 2021-03-24 MED ORDER — POTASSIUM CHLORIDE 20 MEQ PO PACK
40.0000 meq | PACK | Freq: Once | ORAL | Status: AC
  Administered 2021-03-24: 40 meq
  Filled 2021-03-24: qty 2

## 2021-03-24 MED ORDER — ACETAMINOPHEN 160 MG/5ML PO SOLN
650.0000 mg | Freq: Once | ORAL | Status: AC
  Administered 2021-03-25: 650 mg
  Filled 2021-03-24: qty 20.3

## 2021-03-24 MED ORDER — ORAL CARE MOUTH RINSE
15.0000 mL | OROMUCOSAL | Status: DC
  Administered 2021-03-24 – 2021-03-30 (×62): 15 mL via OROMUCOSAL

## 2021-03-24 NOTE — Consult Note (Addendum)
PHARMACY CONSULT NOTE Pharmacy Consult for Electrolyte Monitoring and Replacement   Recent Labs: Potassium (mmol/L)  Date Value  03/24/2021 3.6   Magnesium (mg/dL)  Date Value  29/47/6546 2.3   Calcium (mg/dL)  Date Value  50/35/4656 8.4 (L)   Albumin (g/dL)  Date Value  81/27/5170 3.5   Phosphorus (mg/dL)  Date Value  01/74/9449 4.2   Sodium (mmol/L)  Date Value  03/24/2021 139   Assessment: 51 year old male with hypertension BIB EMS for STEMI found to be in Vfib on arrival. Patient was converted to asystole after defibrillation and intubated in the ED. Patient was taken to the cath lab and was found to have no significant CAD and an LVEF <20%. Patient was then transferred to the CCU. Pharmacy has been consulted to monitor and replace electrolytes.   Nutrition: feeds per tube  Goal of Therapy:  Potassium of 4 and magnesium of 2, all other electrolytes within normal limits  Plan:  --K 3.6, Kcl 40 mEq packet per tube once  --Will continue to monitor with AM labs tomorrow  Elvia Collum  03/24/2021 8:29 AM

## 2021-03-24 NOTE — Progress Notes (Signed)
°   03/24/21 1230  Clinical Encounter Type  Visited With Patient and family together  Visit Type Initial;Critical Care;Social support   Chaplain provided support for family as their loved one is in critical condition. Support provided through compassionate presence, reflective listening and prayer.

## 2021-03-24 NOTE — Progress Notes (Signed)
Patient transported to MRI with this nurse and respiratory therapy, patient tolerated the trip well.

## 2021-03-24 NOTE — Progress Notes (Signed)
[facility] Cardiology  CARDIOLOGY CONSULT NOTE  Patient ID: Huntlee Ovitt MRN: [id number] DOB/AGE: 05/11/1970 51 y.o.  Admit date: 03/23/2021 Referring Physician [facility] Primary Physician Mena Goes [facility] (last seen 07/2019) Primary Cardiologist  Reason for Consultation cardiopulmonary arrest  HPI: 51 year old gentleman with a PMH significant for hypertension and hyperlipidemia referred for cardiopulmonary arrest with suspicion for ST elevation myocardial infarction.  Patient has a history of essential hypertension.  Patient was sleeping, found by his wife agonal respirations, called EMS, and performed bystander CPR.  On EMS arrival patient was found to be in ventricular fibrillation and defibrillated x1, converted to asystole given 2 rounds of epinephrine.  Post defibrillation ECG revealed sinus tachycardia with right bundle branch block and apparent ST elevation in anterolateral leads.  Following arrival at [facility] ED, patient was intubated.  ECG in the ED revealed minus rhythm 85 bpm with nonspecific ST-T abnormalities the anterolateral leads.  The patient was brought to the cardiac catheterization laboratory coronary angiography revealed insignificant coronary artery disease.  Left ventriculography revealed mild left ventricular dysfunction with estimated LVEF 45 to 50%.  Interval history: -no acute events. Levophed d/c this morning, propofol weaning for brain MRI today, and remains on keppra. -breathing over the vent today   Review of systems unable to be assessed due to patient's sedation.    Past Medical History:  Diagnosis Date   Hypertension     Past Surgical History:  Procedure Laterality Date   BACK SURGERY     CORONARY/GRAFT ACUTE MI REVASCULARIZATION N/A 03/23/2021   Procedure: Coronary/Graft Acute MI Revascularization;  Surgeon: Isaias Cowman, MD;  Location: [place] [facility];  Service: Cardiovascular;  Laterality: N/A;   LEFT HEART CATH AND CORONARY ANGIOGRAPHY  N/A 03/23/2021   Procedure: LEFT HEART CATH AND CORONARY ANGIOGRAPHY;  Surgeon: Isaias Cowman, MD;  Location: [place] [facility];  Service: Cardiovascular;  Laterality: N/A;   SHOULDER SURGERY      Medications Prior to Admission  Medication Sig Dispense Refill Last Dose   lisinopril (PRINIVIL,ZESTRIL) 10 MG tablet Take 10 mg by mouth daily.    03/22/2021   sildenafil (VIAGRA) 25 MG tablet Take by mouth as directed.   Past Month   lisinopril (ZESTRIL) 10 MG tablet Take 1 tablet (10 mg total) by mouth daily. 30 tablet 1     Social History   Socioeconomic History   Marital status: Married    Spouse name: Not on file   Number of children: Not on file   Years of education: Not on file   Highest education level: Not on file  Occupational History   Not on file  Tobacco Use   Smoking status: Never   Smokeless tobacco: Never  Substance and Sexual Activity   Alcohol use: Yes   Drug use: Never   Sexual activity: Not on file  Other Topics Concern   Not on file  Social History Narrative   Not on file   Social Determinants of Health   Financial Resource Strain: Not on file  Food Insecurity: Not on file  Transportation Needs: Not on file  Physical Activity: Not on file  Stress: Not on file  Social Connections: Not on file  Intimate Partner Violence: Not on file    No family history on file.   Review of systems complete and found to be negative unless listed above   PHYSICAL EXAM General: Black male intubated and sedated, eyes closed. Two aunts at bedside. HEENT:  Normocephalic and atraumatic.  Neck:  No JVD.  Lungs: intubated. Clear bilaterally to auscultation anteriorly Heart: HRRR . Normal S1 and S2 without gallops or murmurs.  Abdomen: Nondistended appearing Msk: Unable to assess Extremities: No clubbing, cyanosis or edema.   Neuro: Unable to assess Psych: Unable to assess  Labs:   Lab Results  Component Value Date   WBC 15.3 (H) 03/24/2021   HGB 15.7  03/24/2021   HCT 47.9 03/24/2021   MCV 91.9 03/24/2021   PLT 193 03/24/2021    Recent Labs  Lab 03/24/21 0513  NA 139  K 3.6  CL 105  CO2 26  BUN 38*  CREATININE 1.91*  CALCIUM 8.4*  PROT 7.1  BILITOT 1.1  ALKPHOS 43  ALT 92*  AST 123*  GLUCOSE 95    No results found for: CKTOTAL, CKMB, CKMBINDEX, TROPONINI  Lab Results  Component Value Date   CHOL 254 (H) 03/23/2021   Lab Results  Component Value Date   HDL 52 03/23/2021   Lab Results  Component Value Date   LDLCALC 174 (H) 03/23/2021   Lab Results  Component Value Date   TRIG 142 03/24/2021   TRIG 138 03/23/2021   Lab Results  Component Value Date   CHOLHDL 4.9 03/23/2021   No results found for: LDLDIRECT    Radiology: DG Chest 1 View  Result Date: 03/23/2021 CLINICAL DATA:  51 year old male status post cardiopulmonary arrest, out of hospital CPR. Altered mental status. EXAM: CHEST  1 VIEW COMPARISON:  Portable chest 0308 hours. FINDINGS: Portable AP upright view at 0458 hours. Endotracheal tube tip in good position between the clavicles and carina. Enteric tube terminates in the left upper quadrant. Pacer pads over the left lower chest. Normal cardiac size and mediastinal contours. Allowing for portable technique the lungs are clear. Visible bowel-gas within normal limits. No displaced rib fracture. IMPRESSION: 1. Satisfactory ET tube and enteric tube. 2.  No acute cardiopulmonary abnormality. Electronically Signed   By: Genevie Ann M.D.   On: 03/23/2021 06:05   DG Abd 1 View  Result Date: 03/23/2021 CLINICAL DATA:  Evaluate OG tube placement. EXAM: ABDOMEN - 1 VIEW COMPARISON:  None. FINDINGS: The OG tube courses below the level of the GE junction. The tip of the OG tube is in the expected location of the gastric fundus with side port well below the GE junction. Single borderline dilated loop of small bowel noted in the left lower quadrant of the abdomen with normal caliber colon. IMPRESSION: OG tube tip is in the  gastric fundus with side port well below the GE junction. Electronically Signed   By: Kerby Moors M.D.   On: 03/23/2021 05:16   CT HEAD WO CONTRAST (5MM)  Result Date: 03/23/2021 CLINICAL DATA:  51 year old male status post cardiopulmonary arrest, out of hospital CPR. Altered mental status. EXAM: CT HEAD WITHOUT CONTRAST TECHNIQUE: Contiguous axial images were obtained from the base of the skull through the vertex without intravenous contrast. RADIATION DOSE REDUCTION: This exam was performed according to the departmental dose-optimization program which includes automated exposure control, adjustment of the mA and/or kV according to patient size and/or use of iterative reconstruction technique. COMPARISON:  None. FINDINGS: Brain: Small round hypodensity at the genu of the left internal capsule on series 2, image 15. Elsewhere gray-white matter differentiation is preserved and within normal limits. However, there is questionable generalized loss of cerebral sulci. But basilar cisterns appear normal. Normal cerebral volume. No midline shift, ventriculomegaly, mass effect, evidence of mass lesion, intracranial hemorrhage or  evidence of cortically based acute infarction. Vascular: Mild generalized intracranial vascular hyperdensity, perhaps related to hemoconcentration. No suspicious intracranial vascular hyperdensity. Skull: No acute osseous abnormality identified. Sinuses/Orbits: Intubated on the scout view. Fluid in the visible pharynx. Right mastoid effusion. Right tympanic cavity remains clear. Mild left mastoid effusion. Paranasal sinuses remain well aerated. Other: No acute orbit or scalp soft tissue finding. IMPRESSION: 1. Difficult without a prior study to exclude the possibility of anoxic injury; questionable loss of cerebral sulci throughout the brain, but the gray-white matter differentiation is preserved. If decreased mental status persists then repeat Head CT in 12-24 hours or noncontrast brain MRI  would be valuable. 2. 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 No intracranial hemorrhage or mass effect. 3. Intubated, with mastoid air cell effusions. Electronically Signed   By: Genevie Ann M.D.   On: 03/23/2021 06:04   CARDIAC CATHETERIZATION  Result Date: 03/23/2021   Dist RCA lesion is 20% stenosed.   There is mild left ventricular systolic dysfunction.   The left ventricular ejection fraction is 45-50% by visual estimate. 1.  Insignificant coronary artery disease 2.  Mild reduced left ventricular function with estimated LV ejection fraction 45 to 50% Recommendations 1.  Admit to ICU for ventilator management 2.  2D echocardiogram   DG Chest Port 1 View  Result Date: 03/24/2021 CLINICAL DATA:  Acute respiratory failure, hypoxia EXAM: PORTABLE CHEST 1 VIEW COMPARISON:  Chest radiograph 1 day prior FINDINGS: The endotracheal tube tip is approximately 3.1 cm from the carina. The enteric catheter is coiled in the stomach. A presumed esophageal temperature probe terminates in the mid thorax. The cardiomediastinal silhouette is stable. Lung aeration is unchanged. There is no new or worsening focal airspace disease. There is no pleural effusion or pneumothorax. There is a mildly displaced fracture of the left fifth rib possibly related to CPR. IMPRESSION: Mildly displaced fracture of the left fifth rib which may be related to CPR. Otherwise, stable chest with no radiographic evidence of acute cardiopulmonary process. Electronically Signed   By: Valetta Mole M.D.   On: 03/24/2021 07:21   DG Chest Portable 1 View  Result Date: 03/23/2021 CLINICAL DATA:  Status post intubation. EXAM: PORTABLE CHEST 1 VIEW COMPARISON:  None. FINDINGS: Overlying radiopaque cardiac lead wires and tags are seen. An endotracheal tube is seen with its distal tip approximately 0.9 cm from the carina. Mildly decreased lung volumes are noted. Mild atelectasis and/or infiltrate is seen along the infrahilar region of the  right lung base. There is no evidence of a pleural effusion or pneumothorax. The heart size and mediastinal contours are within normal limits. The visualized skeletal structures are unremarkable. IMPRESSION: 1. Endotracheal tube with its distal tip approximately 0.9 cm from the carina. 2. Mild right basilar atelectasis and/or infiltrate. Electronically Signed   By: Virgina Norfolk M.D.   On: 03/23/2021 03:30   EEG adult  Result Date: 03/23/2021 Derek Jack, MD     03/23/2021  7:35 PM Routine EEG Report Jefte Seawood is a 51 y.o. male with a history of cardiac arrest who is undergoing an EEG to evaluate for seizures. Report: This EEG was acquired with electrodes placed according to the International 10-20 electrode system (including Fp1, Fp2, F3, F4, C3, C4, P3, P4, O1, O2, T3, T4, T5, T6, A1, A2, Fz, Cz, Pz). The following electrodes were missing or displaced: none. There was no clear occipital dominant rhythm. The best background was low amplitude theta which was reactive to stimulation. At  the beginning of the recording propofol had just been paused and there were intervening periods of diffuse suppression. Towards the end of the recording as propofol wore off background became near continuous. There was no focal slowing. There was no sleep architecture observed. There were no interictal epileptiform discharges. There were no electrographic seizures identified. Photic stimulation and hyperventilation were not performed. Impression and clinical correlation: This EEG was obtained while comatose and is abnormal due to: - Moderate diffuse slowing indicative of global cerebral dysfunction - Initial burst suppression pattern which abated as propofol wore off and is favored to be largely (or entirely) secondary to medication effect Su Monks, MD [facility] [phone] If 7pm- 7am, please page neurology on call as listed in [place].   ECHOCARDIOGRAM COMPLETE  Result Date: 03/23/2021     ECHOCARDIOGRAM REPORT   Patient Name:   NEWLAND SANTELL Date of Exam: 03/23/2021 Medical Rec #:  [id number]       Height:       71.0 in Accession #:    [id number]      Weight:       241.0 lb Date of Birth:  06/21/1970       BSA:          2.283 m Patient Age:    65 years        BP:           127/96 mmHg Patient Gender: M               HR:           105 bpm. Exam Location:  [facility] Procedure: 2D Echo, Color Doppler and Cardiac Doppler Indications:     I46.9 Cardiac arrest  History:         Patient has no prior history of Echocardiogram examinations.                  Risk Factors:Hypertension.  Sonographer:     Charmayne Sheer Referring Phys:  [place] Diagnosing Phys: Serafina Royals MD  Sonographer Comments: Echo performed with patient supine and on artificial respirator. IMPRESSIONS  1. Left ventricular ejection fraction, by estimation, is <20%. The left ventricle has severely decreased function. The left ventricle demonstrates global hypokinesis. The left ventricular internal cavity size was mildly dilated. There is mild left ventricular hypertrophy. Left ventricular diastolic parameters are consistent with Grade II diastolic dysfunction (pseudonormalization).  2. Right ventricular systolic function is normal. The right ventricular size is normal.  3. The mitral valve is normal in structure. Trivial mitral valve regurgitation.  4. The aortic valve is normal in structure. Aortic valve regurgitation is not visualized. FINDINGS  Left Ventricle: Left ventricular ejection fraction, by estimation, is <20%. The left ventricle has severely decreased function. The left ventricle demonstrates global hypokinesis. The left ventricular internal cavity size was mildly dilated. There is mild left ventricular hypertrophy. Left ventricular diastolic parameters are consistent with Grade II diastolic dysfunction (pseudonormalization). Right Ventricle: The right ventricular size is normal. No increase in right ventricular wall  thickness. Right ventricular systolic function is normal. Left Atrium: Left atrial size was normal in size. Right Atrium: Right atrial size was normal in size. Pericardium: There is no evidence of pericardial effusion. Mitral Valve: The mitral valve is normal in structure. Trivial mitral valve regurgitation. MV peak gradient, 3.5 mmHg. The mean mitral valve gradient is 2.0 mmHg. Tricuspid Valve: The tricuspid valve is normal in structure. Tricuspid valve regurgitation is trivial. Aortic Valve: The aortic valve is  normal in structure. Aortic valve regurgitation is not visualized. Aortic valve mean gradient measures 2.0 mmHg. Aortic valve peak gradient measures 3.7 mmHg. Aortic valve area, by VTI measures 3.03 cm. Pulmonic Valve: The pulmonic valve was normal in structure. Pulmonic valve regurgitation is not visualized. Aorta: The aortic root and ascending aorta are structurally normal, with no evidence of dilitation. IAS/Shunts: No atrial level shunt detected by color flow Doppler.  LEFT VENTRICLE PLAX 2D LVIDd:         5.81 cm      Diastology LVIDs:         5.17 cm      LV e' medial:    4.68 cm/s LV PW:         1.52 cm      LV E/e' medial:  7.6 LV IVS:        1.01 cm      LV e' lateral:   3.26 cm/s LVOT diam:     2.30 cm      LV E/e' lateral: 10.9 LV SV:         39 LV SV Index:   17 LVOT Area:     4.15 cm  LV Volumes (MOD) LV vol d, MOD A2C: 219.0 ml LV vol d, MOD A4C: 198.0 ml LV vol s, MOD A2C: 156.0 ml LV vol s, MOD A4C: 153.0 ml LV SV MOD A2C:     63.0 ml LV SV MOD A4C:     198.0 ml LV SV MOD BP:      56.7 ml RIGHT VENTRICLE RV Basal diam:  3.99 cm RV S prime:     15.40 cm/s LEFT ATRIUM             Index        RIGHT ATRIUM           Index LA diam:        4.30 cm 1.88 cm/m   RA Area:     15.20 cm LA Vol (A2C):   55.4 ml 24.27 ml/m  RA Volume:   34.90 ml  15.29 ml/m LA Vol (A4C):   48.2 ml 21.11 ml/m LA Biplane Vol: 52.1 ml 22.82 ml/m  AORTIC VALVE                    PULMONIC VALVE AV Area (Vmax):    2.63  cm     PV Vmax:          0.87 m/s AV Area (Vmean):   2.53 cm     PV Vmean:         63.300 cm/s AV Area (VTI):     3.03 cm     PV VTI:           0.127 m AV Vmax:           96.50 cm/s   PV Peak grad:     3.0 mmHg AV Vmean:          67.100 cm/s  PV Mean grad:     2.0 mmHg AV VTI:            0.127 m      PR End Diast Vel: 6.66 msec AV Peak Grad:      3.7 mmHg AV Mean Grad:      2.0 mmHg LVOT Vmax:         61.10 cm/s LVOT Vmean:        40.800 cm/s LVOT VTI:  0.093 m LVOT/AV VTI ratio: 0.73  AORTA Ao Root diam: 3.30 cm MITRAL VALVE MV Area (PHT): 7.99 cm    SHUNTS MV Area VTI:   2.25 cm    Systemic VTI:  0.09 m MV Peak grad:  3.5 mmHg    Systemic Diam: 2.30 cm MV Mean grad:  2.0 mmHg MV Vmax:       0.94 m/s MV Vmean:      67.2 cm/s MV Decel Time: 95 msec MV E velocity: 35.60 cm/s MV A velocity: 84.00 cm/s MV E/A ratio:  0.42 Serafina Royals MD Electronically signed by Serafina Royals MD Signature Date/Time: 03/23/2021/1:02:27 PM    Final     EKG: Sinus rhythm 85 bpm with nonspecific ST abnormalities  Telemetry reviewed by me: sinus tachycardia, rate 111  Echocardiogram 03/23/2020  1. Left ventricular ejection fraction, by estimation, is <20%. The left  ventricle has severely decreased function. The left ventricle demonstrates  global hypokinesis. The left ventricular internal cavity size was mildly  dilated. There is mild left  ventricular hypertrophy. Left ventricular diastolic parameters are  consistent with Grade II diastolic dysfunction (pseudonormalization).   2. Right ventricular systolic function is normal. The right ventricular  size is normal.   3. The mitral valve is normal in structure. Trivial mitral valve  regurgitation.   4. The aortic valve is normal in structure. Aortic valve regurgitation is  not visualized.   ASSESSMENT AND PLAN:   #Cardiopulmonary arrest -suspected 2/2 ventricular fibrillation requiring cardioversion, cardiac catheterization revealing insignificant  coronary artery disease  -weaned off levophed gtt, proprofol weaning for brain MRI today. BP stable. -echocardiogram resulted with LVEF less than 20%, global hypokinesis, severely decreased LV function. -No further cardiac diagnostics necessary at this time.   -We will advance GDMT as appropriate in patient's clinical course.  -Neurology consulted by primary team, appreciate their recommendations  #Respiratory failure -Requiring ventilatory support, supportive care as per primary team  #Essential hypertension -Holding home meds which include losartan-HCTZ 50-25mg   #Acute kidney injury with underlying mild CKD -Cr 2.08 and GFR 38 on admission  This patient's plan of care was discussed and created with Dr. Serafina Royals and he is in agreement.  Signed: Alanson Puls Colleen Donahoe PA-C 03/24/2021, 12:05 PM

## 2021-03-24 NOTE — TOC Initial Note (Signed)
Transition of Care [facility]) - Initial/Assessment Note    Patient Details  Name: Billy Cherry MRN: [id number] Date of Birth: [birth year]  Transition of Care [facility] CM/SW Contact:    Allayne Butcher, RN Phone Number: 03/24/2021, 2:37 PM  Clinical Narrative:                 TOC will follow patient through hospital course and assist with any needs.  Patient admitted after cardiac arrest currently in the ICU intubated and sedated.  MRI today to evaluate for any anoxic injury.   Expected Discharge Plan:  (TBD) Barriers to Discharge: Continued Medical Work up   Patient Goals and CMS Choice Patient states their goals for this hospitalization and ongoing recovery are:: Patient intubated and sedated      Expected Discharge Plan and Services Expected Discharge Plan:  (TBD)       Living arrangements for the past 2 months: Single Family Home                                      Prior Living Arrangements/Services Living arrangements for the past 2 months: Single Family Home Lives with:: Spouse Patient language and need for interpreter reviewed:: Yes        Need for Family Participation in Patient Care: Yes (Comment) (cardiac arrest) Care giver support system in place?: Yes (comment) (wife)   Criminal Activity/Legal Involvement Pertinent to Current Situation/Hospitalization: No - Comment as needed  Activities of Daily Living      Permission Sought/Granted                  Emotional Assessment   Attitude/Demeanor/Rapport: Intubated (Following Commands or Not Following Commands), Sedated Affect (typically observed): Unable to Assess   Alcohol / Substance Use: Not Applicable Psych Involvement: No (comment)  Admission diagnosis:  Cardiac arrest [facility]) [I46.9] Patient Active Problem List   Diagnosis Date Noted   Cardiac arrest ([name]) 03/23/2021   PCP:  Patient, No Pcp Per (Inactive) Pharmacy:   [facility] [address] [place] - [address]  [place] [address] Phone: [phone] Fax: [phone]     Social Determinants of Health (SDOH) Interventions    Readmission Risk Interventions No flowsheet data found.

## 2021-03-24 NOTE — Progress Notes (Signed)
NAME:  Billy Cherry, MRN:  [id number], DOB:  13-Nov-1970, LOS: 1 ADMISSION DATE:  03/23/2021, CONSULTATION DATE:  1/19 REFERRING MD:  Lorinda Creed, CHIEF COMPLAINT:  Found down, cardiac arrest   Brief Pt Description / Synopsis:  51 year old male with out-of-hospital V. fib cardiac arrest.  Cardiac catheterization without evidence of significant coronary artery disease. Now with concern for anoxic brain injury.  History of Present Illness:  51 y/o male was found down by his wife this evening with agonal respirations.  911 called, received out of hospital CPR, 1 shock with defibrillator, 2 doses of epinephrine.  Had return of spontaneous circulation.  Concern on his 12 lead EKG for anterolateral ST elevation so he was taken to the cath lab where his study showed no significant coronary artery disease and an LVEF 45-50%.   He remained encephalopathic post cardiac arrest so PCCM was consulted.    Family notes he rarely drinks alcohol, doesn't smoke, doesn't use illicit drugs.  Takes blood pressure medicine at home, may have run out recently.   Pertinent  Medical History  Hypertension  Micro Data:  1/19 SARS CoV2/Flu > negative  Antimicrobials:  N/A  Significant Hospital Events: Including procedures, antibiotic start and stop dates in addition to other pertinent events   1/19 admission, left heart cath without significant CAD.  Concern for anoxic brain injury 1/20: MRI pending, Neuro following   Interim History / Subjective:  -No significant events reported overnight -Afebrile, hemodynamically stable, no vasopressors -EEG yesterday with moderate diffuse slowing indicative of global cerebral dysfunction -Neuro exam this morning: + Brainstem reflexes (no cough/gag or corneal yesterday), overbreathing the vent, pupils PERRLA, however does not withdraw from pain -MRI pending, neurology following -AKI slightly improved, creatinine 1.91 (2.08 yesterday) ~urine output 3 L yesterday, net -1.6 L  since admit -LFTs slowly trending down -Troponin is now downtrending -Upon obtaining more history from the patient's wife, she reports at home at bedtime the patient noted some swelling of his tongue raises the suspicion that patient could have developed mild angioedema due to his home lisinopril, which led to airway obstruction and eventual respiratory arrest with subsequent cardiac arrest   Objective   Blood pressure (!) 127/102, pulse 83, temperature 98.1 F (36.7 C), resp. rate 20, height 5' 10.98" (1.803 m), weight 112.6 kg, SpO2 95 %.    Vent Mode: PRVC FiO2 (%):  [30 %] 30 % Set Rate:  [20 bmp] 20 bmp Vt Set:  [500 mL] 500 mL PEEP:  [5 cmH20] 5 cmH20 Plateau Pressure:  [19 cmH20] 19 cmH20   Intake/Output Summary (Last 24 hours) at 03/24/2021 1200 Last data filed at 03/24/2021 7591 Gross per 24 hour  Intake 734.06 ml  Output 2250 ml  Net -1515.94 ml   Filed Weights   03/23/21 0428 03/24/21 0500  Weight: 109.7 kg 112.6 kg    Examination:  General: Critically ill-appearing male, laying in bed, intubated and sedated, no acute distress HENT: NCAT ETT in place PULM: Clear to auscultation bilaterally, no wheezing or rales noted, even, overbreathing the ventilator, no accessory muscle use CV: RRR, no mgr GI: BS+, soft, nontender MSK: normal bulk and tone, no deformities, no edema Neuro: Brainstem reflexes intact (positive cough/gag/corneal reflexes, overbreathing the ventilator), pupils PERRLA, however does not withdraw from painful stimulation  Resolved Hospital Problem list     Assessment & Plan:   V. Fib arrest, left heart cath without significant coronary artery disease ~ suspect pt may have developed mild Angioedema due to home Lisinopril  leading to airway obstruction with respiratory arrest Hypertensive Emergency on admission -Continuous cardiac monitoring -Maintain MAP >65 -Vasopressors as needed to maintain MAP goal -Trend lactic acid until normalized -Trend HS  Troponin until peaked -Echocardiogram 03/23/21>> LVEF <20% with global hypokinesis, Grade II diastolic dysfunction, RV function normal -Cardiology following, appreciate input -Cardiac CATH without significant coronary artery disease -Urine drug screen negative -Serum Ethyl Alcohol negative -Continue Heparin gtt as per Cardiology -Upon obtaining more history from the patient's wife, she reports at home at bedtime the patient noted some swelling of his tongue raises the suspicion that patient could have developed mild angioedema due to his home lisinopril, which led to airway obstruction and eventual respiratory arrest with subsequent cardiac arrest   Acute Metabolic Encephalopathy post cardiac arrest Pt exhibiting tongue/lip fasciculations concerning for seizure activity Concern for Anoxic Brain Injury -Maintain a RASS goal of 0 to -1 -Propofol and fentanyl as needed to maintain RASS goal -Avoid sedating medications as able -Daily wake up assessment -Normothermia protocol -CT Head>>IMPRESSION: 1. Difficult without a prior study to exclude the possibility of anoxic injury; questionable loss of cerebral sulci throughout the brain, but the gray-white matter differentiation is preserved. If decreased mental status persists then repeat Head CT in 12-24 hours or noncontrast brain MRI would be valuable. 2. 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 No intracranial hemorrhage or mass effect. 3. Intubated, with mastoid air cell effusions. -EEG with moderate diffuse slowing indicative of global cerebral dysfunction -MRI pending -Continue IV Keppra -Neurology following, appreciate input   Acute Hypoxic Respiratory Failure in the setting of cardiac arrest -Full vent support, implement lung protective strategies -Plateau pressures less than 30 cm H20 -Wean FiO2 & PEEP as tolerated to maintain O2 sats >92% -Follow intermittent Chest X-ray & ABG as needed -Spontaneous Breathing  Trials when respiratory parameters met and mental status permits -Implement VAP Bundle -Prn Bronchodilators   Acute Kidney Injury -Monitor I&O's / urinary output -Follow BMP -Ensure adequate renal perfusion -Avoid nephrotoxic agents as able -Replace electrolytes as indicated   Mild Leukocytosis, suspect reactive due to cardiac arrest -Monitor fever curve -Trend WBC's & Procalcitonin -Follow cultures as above -Will hold off on empiric ABX as this time   Elevated LFT's, shock liver vs. Alcoholic hepatitis -ETOH level negative -Trend LFT's   Hyperglycemia -CBG's q4h; Target range of 140 to 180 -SSI -Follow ICU Hypo/Hyperglycemia protocol      Pt is critically ill, concern for anoxic brain injury.  Prognosis is guarded.  High risk for further cardiac arrest and death. Recommend DNR status.   Pt's wife updated at bedside. All questions answered to her satisfaction.  Best Practice (right click and "Reselect all SmartList Selections" daily)   Diet/type: tubefeeds DVT prophylaxis: prophylactic heparin  GI prophylaxis: PPI Lines: N/A Foley:  Yes, and it is still needed Code Status:  full code Last date of multidisciplinary goals of care discussion [1/20]  Labs   CBC: Recent Labs  Lab 03/23/21 0254 03/24/21 0513  WBC 12.6* 15.3*  NEUTROABS 5.8  --   HGB 14.8 15.7  HCT 45.2 47.9  MCV 93.2 91.9  PLT 228 193     Basic Metabolic Panel: Recent Labs  Lab 03/23/21 0254 03/23/21 0623 03/23/21 1723 03/24/21 0513  NA 138  --   --  139  K 3.5  --   --  3.6  CL 104  --   --  105  CO2 20*  --   --  26  GLUCOSE 249*  --   --  95  BUN 26*  --   --  38*  CREATININE 2.08*  --   --  1.91*  CALCIUM 8.4*  --   --  8.4*  MG  --  2.4 2.4 2.3  PHOS  --  2.5 3.0 4.2    GFR: Estimated Creatinine Clearance: 59 mL/min (A) (by C-G formula based on SCr of 1.91 mg/dL (H)). Recent Labs  Lab 03/23/21 0254 03/24/21 0513  WBC 12.6* 15.3*     Liver Function Tests: Recent  Labs  Lab 03/23/21 0254 03/24/21 0513  AST 150* 123*  ALT 130* 92*  ALKPHOS 74 43  BILITOT 1.2 1.1  PROT 7.2 7.1  ALBUMIN 3.8 3.5    No results for input(s): LIPASE, AMYLASE in the last 168 hours. No results for input(s): AMMONIA in the last 168 hours.  ABG    Component Value Date/Time   PHART 7.43 03/24/2021 0500   PCO2ART 36 03/24/2021 0500   PO2ART 111 (H) 03/24/2021 0500   HCO3 23.9 03/24/2021 0500   ACIDBASEDEF 0.3 03/23/2021 0556   O2SAT 98.5 03/24/2021 0500     Coagulation Profile: Recent Labs  Lab 03/23/21 0254  INR 1.0     Cardiac Enzymes: No results for input(s): CKTOTAL, CKMB, CKMBINDEX, TROPONINI in the last 168 hours.  HbA1C: Hgb A1c MFr Bld  Date/Time Value Ref Range Status  03/23/2021 06:23 AM 5.4 4.8 - 5.6 % Final    Comment:    (NOTE) Pre diabetes:          5.7%-6.4%  Diabetes:              >6.4%  Glycemic control for   <7.0% adults with diabetes   03/23/2021 02:54 AM 5.5 4.8 - 5.6 % Final    Comment:    DUPLICATE AT 8641 03/23/2021. THE NEXT SAMPLE WAS CREDITED. (NOTE) Pre diabetes:          5.7%-6.4%  Diabetes:              >6.4%  Glycemic control for   <7.0% adults with diabetes     CBG: Recent Labs  Lab 03/23/21 1158 03/23/21 1611 03/23/21 1932 03/24/21 0002 03/24/21 0727  GLUCAP 104* 117* 91 107* 106*     Review of Systems:   Cannot obtain due to intubation, AMS  Past Medical History:  He,  has a past medical history of Hypertension.   Surgical History:   Past Surgical History:  Procedure Laterality Date   BACK SURGERY     CORONARY/GRAFT ACUTE MI REVASCULARIZATION N/A 03/23/2021   Procedure: Coronary/Graft Acute MI Revascularization;  Surgeon: Marcina Millard, MD;  Location: [facility] INVASIVE CV LAB;  Service: Cardiovascular;  Laterality: N/A;   LEFT HEART CATH AND CORONARY ANGIOGRAPHY N/A 03/23/2021   Procedure: LEFT HEART CATH AND CORONARY ANGIOGRAPHY;  Surgeon: Marcina Millard, MD;  Location: [facility]  INVASIVE CV LAB;  Service: Cardiovascular;  Laterality: N/A;   SHOULDER SURGERY       Social History:   reports that he has never smoked. He has never used smokeless tobacco. He reports current alcohol use. He reports that he does not use drugs.   Family History:  His family history is not on file.   Allergies Allergies  Allergen Reactions   Lisinopril Swelling    angioedema     Home Medications  Prior to Admission medications   Medication Sig Start Date End Date Taking? Authorizing Provider  lisinopril (PRINIVIL,ZESTRIL) 10 MG tablet Take 10 mg by mouth daily.  11/12/16   [provider]  lisinopril (ZESTRIL) 10 MG tablet Take 1 tablet (10 mg total) by mouth daily. 12/06/19 12/05/20  Johnn Hai, PA-C     Critical care time: 40 minutes    Darel Hong, AGACNP-BC [facility] Prefer epic messenger for cross cover needs If after hours, please call E-link

## 2021-03-24 NOTE — Progress Notes (Signed)
°   03/24/21 2300  Clinical Encounter Type  Visited With Patient  Visit Type Follow-up  Referral From Chaplain  Consult/Referral To Chaplain   Chaplain followed up referral from day shift. Chaplain provided compassionate non-anxious presence and prayer.

## 2021-03-24 NOTE — Plan of Care (Signed)
Continuing with plan of care. 

## 2021-03-25 ENCOUNTER — Inpatient Hospital Stay: Payer: BC Managed Care – PPO

## 2021-03-25 DIAGNOSIS — N179 Acute kidney failure, unspecified: Secondary | ICD-10-CM

## 2021-03-25 LAB — CBC
HCT: 43.7 % (ref 39.0–52.0)
Hemoglobin: 14.4 g/dL (ref 13.0–17.0)
MCH: 30.3 pg (ref 26.0–34.0)
MCHC: 33 g/dL (ref 30.0–36.0)
MCV: 91.8 fL (ref 80.0–100.0)
Platelets: 201 10*3/uL (ref 150–400)
RBC: 4.76 MIL/uL (ref 4.22–5.81)
RDW: 14.2 % (ref 11.5–15.5)
WBC: 20.7 10*3/uL — ABNORMAL HIGH (ref 4.0–10.5)
nRBC: 0 % (ref 0.0–0.2)

## 2021-03-25 LAB — COMPREHENSIVE METABOLIC PANEL
ALT: 63 U/L — ABNORMAL HIGH (ref 0–44)
AST: 56 U/L — ABNORMAL HIGH (ref 15–41)
Albumin: 3.4 g/dL — ABNORMAL LOW (ref 3.5–5.0)
Alkaline Phosphatase: 43 U/L (ref 38–126)
Anion gap: 12 (ref 5–15)
BUN: 58 mg/dL — ABNORMAL HIGH (ref 6–20)
CO2: 24 mmol/L (ref 22–32)
Calcium: 8.5 mg/dL — ABNORMAL LOW (ref 8.9–10.3)
Chloride: 104 mmol/L (ref 98–111)
Creatinine, Ser: 2.53 mg/dL — ABNORMAL HIGH (ref 0.61–1.24)
GFR, Estimated: 30 mL/min — ABNORMAL LOW (ref >60)
Glucose, Bld: 118 mg/dL — ABNORMAL HIGH (ref 70–99)
Potassium: 3.9 mmol/L (ref 3.5–5.1)
Sodium: 140 mmol/L (ref 135–145)
Total Bilirubin: 1 mg/dL (ref 0.3–1.2)
Total Protein: 6.8 g/dL (ref 6.5–8.1)

## 2021-03-25 LAB — GLUCOSE, CAPILLARY
Glucose-Capillary: 109 mg/dL — ABNORMAL HIGH (ref 70–99)
Glucose-Capillary: 118 mg/dL — ABNORMAL HIGH (ref 70–99)
Glucose-Capillary: 126 mg/dL — ABNORMAL HIGH (ref 70–99)
Glucose-Capillary: 131 mg/dL — ABNORMAL HIGH (ref 70–99)
Glucose-Capillary: 133 mg/dL — ABNORMAL HIGH (ref 70–99)
Glucose-Capillary: 139 mg/dL — ABNORMAL HIGH (ref 70–99)

## 2021-03-25 LAB — URINALYSIS, COMPLETE (UACMP) WITH MICROSCOPIC
Bilirubin Urine: NEGATIVE
Glucose, UA: NEGATIVE mg/dL
Ketones, ur: NEGATIVE mg/dL
Leukocytes,Ua: NEGATIVE
Nitrite: NEGATIVE
Protein, ur: 30 mg/dL — AB
Specific Gravity, Urine: 1.02 (ref 1.005–1.030)
Squamous Epithelial / HPF: NONE SEEN (ref 0–5)
pH: 5 (ref 5.0–8.0)

## 2021-03-25 MED ORDER — PANTOPRAZOLE 2 MG/ML SUSPENSION
40.0000 mg | Freq: Every day | ORAL | Status: DC
  Administered 2021-03-25 – 2021-03-30 (×5): 40 mg
  Filled 2021-03-25 (×5): qty 20

## 2021-03-25 MED ORDER — ACETAMINOPHEN 325 MG PO TABS
650.0000 mg | ORAL_TABLET | Freq: Four times a day (QID) | ORAL | Status: DC | PRN
  Administered 2021-03-25 – 2021-03-28 (×3): 650 mg
  Filled 2021-03-25 (×4): qty 2

## 2021-03-25 MED ORDER — LABETALOL HCL 5 MG/ML IV SOLN
20.0000 mg | Freq: Once | INTRAVENOUS | Status: AC
  Administered 2021-03-25: 20 mg via INTRAVENOUS

## 2021-03-25 MED ORDER — LABETALOL HCL 200 MG PO TABS
200.0000 mg | ORAL_TABLET | Freq: Three times a day (TID) | ORAL | Status: DC
  Administered 2021-03-25: 200 mg
  Filled 2021-03-25 (×2): qty 1

## 2021-03-25 MED ORDER — FENTANYL CITRATE PF 50 MCG/ML IJ SOSY
50.0000 ug | PREFILLED_SYRINGE | INTRAMUSCULAR | Status: DC | PRN
  Administered 2021-03-25 – 2021-03-26 (×5): 50 ug via INTRAVENOUS
  Filled 2021-03-25: qty 1
  Filled 2021-03-25: qty 4
  Filled 2021-03-25: qty 1

## 2021-03-25 MED ORDER — SODIUM CHLORIDE 0.9 % IV SOLN
2.0000 g | INTRAVENOUS | Status: AC
  Administered 2021-03-25 – 2021-03-29 (×5): 2 g via INTRAVENOUS
  Filled 2021-03-25: qty 2
  Filled 2021-03-25: qty 20
  Filled 2021-03-25 (×2): qty 2
  Filled 2021-03-25: qty 20
  Filled 2021-03-25: qty 2
  Filled 2021-03-25: qty 20

## 2021-03-25 MED ORDER — FENTANYL CITRATE PF 50 MCG/ML IJ SOSY
50.0000 ug | PREFILLED_SYRINGE | INTRAMUSCULAR | Status: DC | PRN
  Filled 2021-03-25: qty 1

## 2021-03-25 NOTE — Progress Notes (Signed)
Around 1520 patient was administered prn labetalol for B/P 165/111; around 1700 patient's B/P had increased to 174/108 and prn hydralazine and fentanyl administered - Dr. Arlean Hopping and Dr. Gwen Pounds notified.  Patient had propofol turned off at 1015 this am, Dr. Arlean Hopping declines to have propofol restarted at this time.  Dr. Gwen Pounds ordered to administer Labetalol 20mg  IV for B/P 179/118 at 1745, patients B/P at 1800 154/103; will continue to monitor patient and will endorse to oncoming nurse.

## 2021-03-25 NOTE — Consult Note (Signed)
PHARMACY CONSULT NOTE Pharmacy Consult for Electrolyte Monitoring and Replacement   Recent Labs: Potassium (mmol/L)  Date Value  03/25/2021 3.9   Magnesium (mg/dL)  Date Value  67/34/1937 2.2   Calcium (mg/dL)  Date Value  90/24/0973 8.5 (L)   Albumin (g/dL)  Date Value  53/29/9242 3.4 (L)   Phosphorus (mg/dL)  Date Value  68/34/1962 5.0 (H)   Sodium (mmol/L)  Date Value  03/25/2021 140   Assessment: 51 year old male with hypertension BIB EMS for STEMI found to be in Vfib on arrival. Patient converted to asystole after defibrillation by EMS, but subsequently with ROSC after CPR/epi. He was intubated in the ED. Patient was taken to the cath lab and was found to have no significant CAD and an LVEF <20%. Patient was then transferred to the CCU. Pharmacy has been consulted to monitor and replace electrolytes.   Nutrition: tube feeds  Goal of Therapy:  Potassium ~ 4, magnesium ~ 2, all other electrolytes within normal limits  Plan:  --Creatinine increasing --No replacement indicated today --Will continue to monitor with labs tomorrow  Pricilla Riffle, PharmD, BCPS Clinical Pharmacist 03/25/2021 7:37 AM

## 2021-03-25 NOTE — Progress Notes (Signed)
NAME:  Billy Cherry, MRN:  [id number], DOB:  [birth year], LOS: 2 ADMISSION DATE:  03/23/2021, CONSULTATION DATE:  1/19 REFERRING MD:  Lorinda Creed, CHIEF COMPLAINT:  Found down, cardiac arrest   Brief Pt Description / Synopsis:  51 year old male with out-of-hospital V. fib cardiac arrest.  Cardiac catheterization without evidence of significant coronary artery disease. Now with concern for anoxic brain injury.  History of Present Illness:  51 y/o male was found down by his wife this evening with agonal respirations.  911 called, received out of hospital CPR, 1 shock with defibrillator, 2 doses of epinephrine.  Had return of spontaneous circulation.  Concern on his 12 lead EKG for anterolateral ST elevation so he was taken to the cath lab where his study showed no significant coronary artery disease and an LVEF 45-50%.   He remained encephalopathic post cardiac arrest so PCCM was consulted.    Family notes he rarely drinks alcohol, doesn't smoke, doesn't use illicit drugs.  Takes blood pressure medicine at home, may have run out recently.   Pertinent  Medical History  Hypertension  Micro Data:  1/19 SARS CoV2/Flu > negative  Antimicrobials:  N/A  Significant Hospital Events: Including procedures, antibiotic start and stop dates in addition to other pertinent events   1/19 admission, left heart cath without significant CAD.  Concern for anoxic brain injury 1/20: MRI pending, Neuro following 1/21: MRI shows areas of hypoxic-ischemic injury bilaterally  Interim History / Subjective:  MRI completed yesterday with evidence for hypoxic-ischemic injury. He remains afebrile. Continues to breath over the ventilator. Cough, gag and pupillary reflexes are intact, otherwise no change in neuro status. Renal function worsened overnight although UOP was 50 cc/kg/hr last 24 hours. Tongue swelling is still present but is decreased this AM per wife.  Objective   Blood pressure (!) 148/105, pulse (!) 107,  temperature 100.2 F (37.9 C), temperature source Esophageal, resp. rate 19, height 5' 10.98" (1.803 m), weight 108.3 kg, SpO2 96 %.    Vent Mode: PRVC FiO2 (%):  [30 %] 30 % Set Rate:  [20 bmp] 20 bmp Vt Set:  [500 mL] 500 mL PEEP:  [5 cmH20] 5 cmH20 Plateau Pressure:  [11 cmH20-17 cmH20] 11 cmH20   Intake/Output Summary (Last 24 hours) at 03/25/2021 1417 Last data filed at 03/25/2021 1325 Gross per 24 hour  Intake 592.6 ml  Output 1350 ml  Net -757.4 ml   Filed Weights   03/23/21 0428 03/24/21 0500 03/25/21 0500  Weight: 109.7 kg 112.6 kg 108.3 kg    Examination:  General: ll-appearing male, laying in bed, intubated and sedated PULM: Clear to auscultation bilaterally, no wheezing or rales CV: RRR, no mgr GI: BS+, soft, nontender MSK: normal bulk and tone, no deformities, no edema Neuro: Brainstem reflexes intact (positive cough/gag/corneal reflexes, overbreathing the ventilator), pupils PERRLA, however does not withdraw from painful stimulation and extremities are quite flaccid  Resolved Hospital Problem list   Hypertensive emergency Lactic acidosis  Assessment & Plan:   V. Fib arrest, left heart cath without significant coronary artery disease Possible angioedema from ACE inhibitor Possible explanation is angioedema due to home lisinopril leading to airway obstruction with respiratory arrest. Primary arrhythmogenic etiology also very possible. -Cardiology consulted; appreciate input -LHC showed no obstructive culprit lesion -Echocardiogram 03/23/21>> LVEF <20% with global hypokinesis, Grade II diastolic dysfunction, RV function normal -Continuous cardiac monitoring -Urine drug screen negative -Serum Ethyl Alcohol negative -Completed heparin gtt  Acute Hypoxic Respiratory Failure in the setting of cardiac arrest -Full  vent support, implement lung protective strategies -Plateau pressures less than 30 cm H20 -Wean FiO2 & PEEP as tolerated to maintain O2 sats  >92% -Follow intermittent Chest X-ray & ABG as needed -Spontaneous Breathing Trials when respiratory parameters met and mental status permits -Implement VAP Bundle -Prn Bronchodilators  Acute Metabolic Encephalopathy post cardiac arrest Hypoxic ischemic injury 2/2 cardiac arreast -Neurology consulted; appreciate input -RASS goal 0; minimize sedation -Daily wake up assessment -Normothermia protocol -Continue IV Keppra (no evidence for seizure on EKG) -Neuro checks per protocol   Acute Kidney Injury -Monitor I&O's / urinary output -Follow BMP -Ensure adequate renal perfusion -Avoid nephrotoxic agents as able -Replace electrolytes as indicated   Mild Leukocytosis, suspect reactive due to cardiac arrest -Monitor fever curve -Trend WBC's & Procalcitonin -Follow cultures as above -Will hold off on empiric ABX as this time   Elevated LFT's, shock liver vs. Alcoholic hepatitis -ETOH level negative -Trend LFT's   Hyperglycemia -CBG's q4h; Target range of 140 to 180 -SSI -Follow ICU Hypo/Hyperglycemia protocol  Pt is critically ill with clear evidence for anoxic brain injury.  Prognosis is guarded.  High risk for further cardiac arrest and death.   Pt's wife updated at bedside. All questions answered to the best of my ability.  Best Practice (right click and "Reselect all SmartList Selections" daily)   Diet/type: tubefeeds DVT prophylaxis: prophylactic heparin  GI prophylaxis: PPI Lines: N/A Foley:  Yes, and it is still needed Code Status:  full code Last date of multidisciplinary goals of care discussion [1/20]  Labs   CBC: Recent Labs  Lab 03/23/21 0254 03/24/21 0513 03/25/21 0441  WBC 12.6* 15.3* 20.7*  NEUTROABS 5.8  --   --   HGB 14.8 15.7 14.4  HCT 45.2 47.9 43.7  MCV 93.2 91.9 91.8  PLT 228 193 462    Basic Metabolic Panel: Recent Labs  Lab 03/23/21 0254 03/23/21 0623 03/23/21 1723 03/24/21 0513 03/24/21 1700 03/25/21 0441  NA 138  --   --  139   --  140  K 3.5  --   --  3.6  --  3.9  CL 104  --   --  105  --  104  CO2 20*  --   --  26  --  24  GLUCOSE 249*  --   --  95  --  118*  BUN 26*  --   --  38*  --  58*  CREATININE 2.08*  --   --  1.91*  --  2.53*  CALCIUM 8.4*  --   --  8.4*  --  8.5*  MG  --  2.4 2.4 2.3 2.2  --   PHOS  --  2.5 3.0 4.2 5.0*  --    GFR: Estimated Creatinine Clearance: 43.7 mL/min (A) (by C-G formula based on SCr of 2.53 mg/dL (H)). Recent Labs  Lab 03/23/21 0254 03/24/21 0513 03/25/21 0441  WBC 12.6* 15.3* 20.7*    Liver Function Tests: Recent Labs  Lab 03/23/21 0254 03/24/21 0513 03/25/21 0441  AST 150* 123* 56*  ALT 130* 92* 63*  ALKPHOS 74 43 43  BILITOT 1.2 1.1 1.0  PROT 7.2 7.1 6.8  ALBUMIN 3.8 3.5 3.4*   No results for input(s): LIPASE, AMYLASE in the last 168 hours. No results for input(s): AMMONIA in the last 168 hours.  ABG    Component Value Date/Time   PHART 7.43 03/24/2021 0500   PCO2ART 36 03/24/2021 0500   PO2ART 111 (H) 03/24/2021  0500   HCO3 23.9 03/24/2021 0500   ACIDBASEDEF 0.3 03/23/2021 0556   O2SAT 98.5 03/24/2021 0500     Coagulation Profile: Recent Labs  Lab 03/23/21 0254  INR 1.0    Cardiac Enzymes: No results for input(s): CKTOTAL, CKMB, CKMBINDEX, TROPONINI in the last 168 hours.  HbA1C: Hgb A1c MFr Bld  Date/Time Value Ref Range Status  03/23/2021 06:23 AM 5.4 4.8 - 5.6 % Final    Comment:    (NOTE) Pre diabetes:          5.7%-6.4%  Diabetes:              >6.4%  Glycemic control for   <7.0% adults with diabetes   03/23/2021 02:54 AM 5.5 4.8 - 5.6 % Final    Comment:    DUPLICATE AT 2787 18/36/7255. THE NEXT SAMPLE WAS CREDITED. (NOTE) Pre diabetes:          5.7%-6.4%  Diabetes:              >6.4%  Glycemic control for   <7.0% adults with diabetes     CBG: Recent Labs  Lab 03/24/21 2016 03/25/21 0029 03/25/21 0457 03/25/21 0733 03/25/21 1144  GLUCAP 96 131* 126* 109* 133*    Review of Systems:   Cannot obtain due  to intubation, AMS  Past Medical History:  He,  has a past medical history of Hypertension.   Surgical History:   Past Surgical History:  Procedure Laterality Date   BACK SURGERY     CORONARY/GRAFT ACUTE MI REVASCULARIZATION N/A 03/23/2021   Procedure: Coronary/Graft Acute MI Revascularization;  Surgeon: Isaias Cowman, MD;  Location: [place] [facility];  Service: Cardiovascular;  Laterality: N/A;   LEFT HEART CATH AND CORONARY ANGIOGRAPHY N/A 03/23/2021   Procedure: LEFT HEART CATH AND CORONARY ANGIOGRAPHY;  Surgeon: Isaias Cowman, MD;  Location: [place] [facility];  Service: Cardiovascular;  Laterality: N/A;   SHOULDER SURGERY       Social History:   reports that he has never smoked. He has never used smokeless tobacco. He reports current alcohol use. He reports that he does not use drugs.   Family History:  His family history is not on file.   Allergies Allergies  Allergen Reactions   Lisinopril Swelling    angioedema     Home Medications  Prior to Admission medications   Medication Sig Start Date End Date Taking? Authorizing Provider  lisinopril (PRINIVIL,ZESTRIL) 10 MG tablet Take 10 mg by mouth daily.  11/12/16   [provider]  lisinopril (ZESTRIL) 10 MG tablet Take 1 tablet (10 mg total) by mouth daily. 12/06/19 12/05/20  Johnn Hai, PA-C     Critical care time: 40 minutes    Bennie Pierini, MD 03/25/21 2:17 PM    [facility] Critical Care Prefer epic messenger for cross cover needs If after hours, please call E-link

## 2021-03-25 NOTE — Progress Notes (Signed)
Patient had an eleven beat run of V-Tach, VSS otherwise stable, Dr. Nehemiah Massed notified and instructed to observe patient at this time.

## 2021-03-25 NOTE — Progress Notes (Addendum)
Neurology progress note  S: No acute events overnight. Patient underwent brain MRI that showed signs of anoxic brain injury without large areas of infarct.  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 Does not follow commands *Speech: intubated *CN: 24mm ERRL, (+) corneals and oculocephalics, (-) cough and gag, face symmetric at rest *Motor & sensory: no spontaneous movement; no motor response to noxious stimuli ina ny extremity, grimaces to noxious stimuli in RUE only *Reflexes: 1+ symmetric throughout toes mute bilat *Coordination, gait: UTA  A/P: This is a 51 year old gentleman with history of hypertension who is admitted after cardiac arrest 03/22/21 on whom neurology is consulted for prognostication.  Etiology of arrest is unclear, given insignificant CAD on cath cardiology suspects this was 2/2 primary VF event. He now does have severely depressed EF (not present on left ventriculography during cath) and is on heparin gtt for NSTEMI. He has some brainstem reflexes on exam and does flutter eyes in response to noxious stimuli but does not follow commands or have any motor response in his extremities to pain. MRI brain wo contrast does show e/o anoxic brain injury. When he is hemodynamically stable sedation should be weaned to allow for formal prognostication exam. I have told his wife that until he can be examined off sedation for at least 24 hours it is too soon to tell what his neurologic prognosis is expected to  be.  He is not having any twitching on my exam, recommend continuation of keppra for now. rEEG 1/19 did not show any epileptiform abnl.     Recommendations    - Continue keppra 500mg  bid - Wean sedation as able - Will continue to follow ______________________________________________________________________   This patient is critically ill and at significant risk of neurological worsening, death and care requires constant monitoring of vital signs, hemodynamics,respiratory and cardiac monitoring, neurological assessment, discussion with family, other specialists and medical decision making of high complexity. I spent 45 minutes of neurocritical care time  in the care of  this patient. This was time spent independent of any time provided by nurse practitioner or PA.   , MD [facility] [phone]   If 7pm- 7am, please page neurology on call as listed in [facility].

## 2021-03-25 NOTE — Progress Notes (Signed)
B/P 165/114 and Temp 101.1, Dr. Arlean Hopping notified and ordered prn Labetalol and Tylenol.

## 2021-03-25 NOTE — Plan of Care (Signed)
Continuing with plan of care. 

## 2021-03-25 NOTE — Progress Notes (Signed)
[facility] Cardiology  CARDIOLOGY CONSULT NOTE  Patient ID: Billy Cherry MRN: [id number] DOB/AGE: Jul 05, 1970 51 y.o.  Admit date: 03/23/2021 Referring Physician [facility] Primary Physician Mena Goes [facility] family med (last seen 07/2019) Primary Cardiologist  Reason for Consultation cardiopulmonary arrest  HPI: 51 year old gentleman with a PMH significant for hypertension and hyperlipidemia referred for cardiopulmonary arrest with suspicion for ST elevation myocardial infarction.  Patient has a history of essential hypertension.  Patient was sleeping, found by his wife agonal respirations, called EMS, and performed bystander CPR.  On EMS arrival patient was found to be in ventricular fibrillation and defibrillated x1, converted to asystole given 2 rounds of epinephrine.  Post defibrillation ECG revealed sinus tachycardia with right bundle branch block and apparent ST elevation in anterolateral leads.  Following arrival at [facility] ED, patient was intubated.  ECG in the ED revealed minus rhythm 85 bpm with nonspecific ST-T abnormalities the anterolateral leads.  The patient was brought to the cardiac catheterization laboratory coronary angiography revealed insignificant coronary artery disease.  Left ventriculography revealed mild left ventricular dysfunction with estimated LVEF 45 to 50%.  Interval history: -no acute events. Levophed d/c yesterday, propofol weaning for brain MRI  , and remains on keppra. -Short run of nonsustained ventricular tachycardia by telemetry -No other cardiovascular concerns at this time   Review of systems unable to be assessed due to patient's sedation.    Past Medical History:  Diagnosis Date   Hypertension     Past Surgical History:  Procedure Laterality Date   BACK SURGERY     CORONARY/GRAFT ACUTE MI REVASCULARIZATION N/A 03/23/2021   Procedure: Coronary/Graft Acute MI Revascularization;  Surgeon: Isaias Cowman, MD;  Location: [place] [facility];  Service:  Cardiovascular;  Laterality: N/A;   LEFT HEART CATH AND CORONARY ANGIOGRAPHY N/A 03/23/2021   Procedure: LEFT HEART CATH AND CORONARY ANGIOGRAPHY;  Surgeon: Isaias Cowman, MD;  Location: [place] [facility];  Service: Cardiovascular;  Laterality: N/A;   SHOULDER SURGERY      Medications Prior to Admission  Medication Sig Dispense Refill Last Dose   lisinopril (PRINIVIL,ZESTRIL) 10 MG tablet Take 10 mg by mouth daily.    03/22/2021   sildenafil (VIAGRA) 25 MG tablet Take by mouth as directed.   Past Month   lisinopril (ZESTRIL) 10 MG tablet Take 1 tablet (10 mg total) by mouth daily. 30 tablet 1     Social History   Socioeconomic History   Marital status: Married    Spouse name: Not on file   Number of children: Not on file   Years of education: Not on file   Highest education level: Not on file  Occupational History   Not on file  Tobacco Use   Smoking status: Never   Smokeless tobacco: Never  Substance and Sexual Activity   Alcohol use: Yes   Drug use: Never   Sexual activity: Not on file  Other Topics Concern   Not on file  Social History Narrative   Not on file   Social Determinants of Health   Financial Resource Strain: Not on file  Food Insecurity: Not on file  Transportation Needs: Not on file  Physical Activity: Not on file  Stress: Not on file  Social Connections: Not on file  Intimate Partner Violence: Not on file    No family history on file.   Review of systems complete and found to be negative unless listed above   PHYSICAL EXAM General: Black male intubated and sedated, eyes closed. Two aunts  at bedside. HEENT:  Normocephalic and atraumatic.  Neck:  No JVD.  Lungs: intubated. Clear bilaterally to auscultation anteriorly Heart: HRRR . Normal S1 and S2 without gallops or murmurs.  Abdomen: Nondistended appearing Msk: Unable to assess Extremities: No clubbing, cyanosis or edema.   Neuro: Unable to assess Psych: Unable to assess  Labs:    Lab Results  Component Value Date   WBC 20.7 (H) 03/25/2021   HGB 14.4 03/25/2021   HCT 43.7 03/25/2021   MCV 91.8 03/25/2021   PLT 201 03/25/2021    Recent Labs  Lab 03/25/21 0441  NA 140  K 3.9  CL 104  CO2 24  BUN 58*  CREATININE 2.53*  CALCIUM 8.5*  PROT 6.8  BILITOT 1.0  ALKPHOS 43  ALT 63*  AST 56*  GLUCOSE 118*    No results found for: CKTOTAL, CKMB, CKMBINDEX, TROPONINI  Lab Results  Component Value Date   CHOL 254 (H) 03/23/2021   Lab Results  Component Value Date   HDL 52 03/23/2021   Lab Results  Component Value Date   LDLCALC 174 (H) 03/23/2021   Lab Results  Component Value Date   TRIG 142 03/24/2021   TRIG 138 03/23/2021   Lab Results  Component Value Date   CHOLHDL 4.9 03/23/2021   No results found for: LDLDIRECT    Radiology: DG Chest 1 View  Result Date: 03/23/2021 CLINICAL DATA:  51 year old male status post cardiopulmonary arrest, out of hospital CPR. Altered mental status. EXAM: CHEST  1 VIEW COMPARISON:  Portable chest 0308 hours. FINDINGS: Portable AP upright view at 0458 hours. Endotracheal tube tip in good position between the clavicles and carina. Enteric tube terminates in the left upper quadrant. Pacer pads over the left lower chest. Normal cardiac size and mediastinal contours. Allowing for portable technique the lungs are clear. Visible bowel-gas within normal limits. No displaced rib fracture. IMPRESSION: 1. Satisfactory ET tube and enteric tube. 2.  No acute cardiopulmonary abnormality. Electronically Signed   By: Genevie Ann M.D.   On: 03/23/2021 06:05   DG Abd 1 View  Result Date: 03/23/2021 CLINICAL DATA:  Evaluate OG tube placement. EXAM: ABDOMEN - 1 VIEW COMPARISON:  None. FINDINGS: The OG tube courses below the level of the GE junction. The tip of the OG tube is in the expected location of the gastric fundus with side port well below the GE junction. Single borderline dilated loop of small bowel noted in the left lower  quadrant of the abdomen with normal caliber colon. IMPRESSION: OG tube tip is in the gastric fundus with side port well below the GE junction. Electronically Signed   By: Kerby Moors M.D.   On: 03/23/2021 05:16   CT HEAD WO CONTRAST (5MM)  Result Date: 03/23/2021 CLINICAL DATA:  51 year old male status post cardiopulmonary arrest, out of hospital CPR. Altered mental status. EXAM: CT HEAD WITHOUT CONTRAST TECHNIQUE: Contiguous axial images were obtained from the base of the skull through the vertex without intravenous contrast. RADIATION DOSE REDUCTION: This exam was performed according to the departmental dose-optimization program which includes automated exposure control, adjustment of the mA and/or kV according to patient size and/or use of iterative reconstruction technique. COMPARISON:  None. FINDINGS: Brain: Small round hypodensity at the genu of the left internal capsule on series 2, image 15. Elsewhere gray-white matter differentiation is preserved and within normal limits. However, there is questionable generalized loss of cerebral sulci. But basilar cisterns appear normal. Normal cerebral volume. No midline shift,  ventriculomegaly, mass effect, evidence of mass lesion, intracranial hemorrhage or evidence of cortically based acute infarction. Vascular: Mild generalized intracranial vascular hyperdensity, perhaps related to hemoconcentration. No suspicious intracranial vascular hyperdensity. Skull: No acute osseous abnormality identified. Sinuses/Orbits: Intubated on the scout view. Fluid in the visible pharynx. Right mastoid effusion. Right tympanic cavity remains clear. Mild left mastoid effusion. Paranasal sinuses remain well aerated. Other: No acute orbit or scalp soft tissue finding. IMPRESSION: 1. Difficult without a prior study to exclude the possibility of anoxic injury; questionable loss of cerebral sulci throughout the brain, but the gray-white matter differentiation is preserved. If  decreased mental status persists then repeat Head CT in 12-24 hours or noncontrast brain MRI would be valuable. 2. 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 No intracranial hemorrhage or mass effect. 3. Intubated, with mastoid air cell effusions. Electronically Signed   By: Genevie Ann M.D.   On: 03/23/2021 06:04   MR BRAIN WO CONTRAST  Result Date: 03/24/2021 CLINICAL DATA:  Anoxic brain damage. EXAM: MRI HEAD WITHOUT CONTRAST TECHNIQUE: Multiplanar, multiecho pulse sequences of the brain and surrounding structures were obtained without intravenous contrast. COMPARISON:  Head CT March 23, 2021 FINDINGS: Brain: Cortical restricted diffusion along the bilateral ACA-MCA watershed, bilateral occipital lobes, posterior cingulate, bilateral caudate and lentiform nucleus, particularly posterior aspect of the putamen, consistent with hypoxic ischemic injury. Questionable restricted diffusion in the tail of left hippocampus. Punctate foci of restricted diffusion are also seen in the left cerebellar hemisphere, right posterior temporal lobe, medial right frontal lobe and left occipital lobe. Posterior fossa structures are otherwise maintained. No significant mass effect, hydrocephalus, hemorrhage, extra-axial collection or mass lesion. Remote lacunar infarct at the genu of the left internal capsule. A few scattered foci of T2 hyperintensity within the white matter of the cerebral hemispheres and periventricular white matter of the left temporal lobe, nonspecific. Vascular: Normal flow voids. Skull and upper cervical spine: No focal marrow lesion. Sinuses/Orbits: Fluid level within the right maxillary sinus and bilateral mastoid effusion may be related to endotracheal tube. The orbits are maintained. Other: None. 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 Electronically Signed   By: Pedro Earls M.D.   On: 03/24/2021 13:53   CARDIAC CATHETERIZATION  Result Date: 03/23/2021   Dist RCA lesion is 20% stenosed.   There is mild left ventricular systolic dysfunction.   The left ventricular ejection fraction is 45-50% by visual estimate. 1.  Insignificant coronary artery disease 2.  Mild reduced left ventricular function with estimated LV ejection fraction 45 to 50% Recommendations 1.  Admit to ICU for ventilator management 2.  2D echocardiogram   DG Chest Port 1 View  Result Date: 03/24/2021 CLINICAL DATA:  Acute respiratory failure, hypoxia EXAM: PORTABLE CHEST 1 VIEW COMPARISON:  Chest radiograph 1 day prior FINDINGS: The endotracheal tube tip is approximately 3.1 cm from the carina. The enteric catheter is coiled in the stomach. A presumed esophageal temperature probe terminates in the mid thorax. The cardiomediastinal silhouette is stable. Lung aeration is unchanged. There is no new or worsening focal airspace disease. There is no pleural effusion or pneumothorax. There is a mildly displaced fracture of the left fifth rib possibly related to CPR. IMPRESSION: Mildly displaced fracture of the left fifth rib which may be related to CPR. Otherwise, stable chest with no radiographic evidence of acute cardiopulmonary process. Electronically Signed   By: Court Joy.D.  On: 03/24/2021 07:21   DG Chest Portable 1 View  Result Date: 03/23/2021 CLINICAL DATA:  Status post intubation. EXAM: PORTABLE CHEST 1 VIEW COMPARISON:  None. FINDINGS: Overlying radiopaque cardiac lead wires and tags are seen. An endotracheal tube is seen with its distal tip approximately 0.9 cm from the carina. Mildly decreased lung volumes are noted. Mild atelectasis and/or infiltrate is seen along the infrahilar region of the right lung base. There is no evidence of a pleural effusion or pneumothorax. The heart size and mediastinal contours are within normal limits. The visualized skeletal structures are  unremarkable. IMPRESSION: 1. Endotracheal tube with its distal tip approximately 0.9 cm from the carina. 2. Mild right basilar atelectasis and/or infiltrate. Electronically Signed   By: Virgina Norfolk M.D.   On: 03/23/2021 03:30   EEG adult  Result Date: 03/23/2021 Derek Jack, MD     03/23/2021  7:35 PM Routine EEG Report Billy Cherry is a 51 y.o. male with a history of cardiac arrest who is undergoing an EEG to evaluate for seizures. Report: This EEG was acquired with electrodes placed according to the International 10-20 electrode system (including Fp1, Fp2, F3, F4, C3, C4, P3, P4, O1, O2, T3, T4, T5, T6, A1, A2, Fz, Cz, Pz). The following electrodes were missing or displaced: none. There was no clear occipital dominant rhythm. The best background was low amplitude theta which was reactive to stimulation. At the beginning of the recording propofol had just been paused and there were intervening periods of diffuse suppression. Towards the end of the recording as propofol wore off background became near continuous. There was no focal slowing. There was no sleep architecture observed. There were no interictal epileptiform discharges. There were no electrographic seizures identified. Photic stimulation and hyperventilation were not performed. Impression and clinical correlation: This EEG was obtained while comatose and is abnormal due to: - Moderate diffuse slowing indicative of global cerebral dysfunction - Initial burst suppression pattern which abated as propofol wore off and is favored to be largely (or entirely) secondary to medication effect Su Monks, MD [facility] [phone] If 7pm- 7am, please page neurology on call as listed in [place].   ECHOCARDIOGRAM COMPLETE  Result Date: 03/23/2021    ECHOCARDIOGRAM REPORT   Patient Name:   Billy Cherry Date of Exam: 03/23/2021 Medical Rec #:  [id number]       Height:       71.0 in Accession #:    [id number]      Weight:       241.0  lb Date of Birth:  February 01, 1971       BSA:          2.283 m Patient Age:    84 years        BP:           127/96 mmHg Patient Gender: M               HR:           105 bpm. Exam Location:  [facility] Procedure: 2D Echo, Color Doppler and Cardiac Doppler Indications:     I46.9 Cardiac arrest  History:         Patient has no prior history of Echocardiogram examinations.                  Risk Factors:Hypertension.  Sonographer:     Charmayne Sheer Referring Phys:  [place] Diagnosing Phys: Serafina Royals MD  Sonographer Comments: Echo performed with patient  supine and on artificial respirator. IMPRESSIONS  1. Left ventricular ejection fraction, by estimation, is <20%. The left ventricle has severely decreased function. The left ventricle demonstrates global hypokinesis. The left ventricular internal cavity size was mildly dilated. There is mild left ventricular hypertrophy. Left ventricular diastolic parameters are consistent with Grade II diastolic dysfunction (pseudonormalization).  2. Right ventricular systolic function is normal. The right ventricular size is normal.  3. The mitral valve is normal in structure. Trivial mitral valve regurgitation.  4. The aortic valve is normal in structure. Aortic valve regurgitation is not visualized. FINDINGS  Left Ventricle: Left ventricular ejection fraction, by estimation, is <20%. The left ventricle has severely decreased function. The left ventricle demonstrates global hypokinesis. The left ventricular internal cavity size was mildly dilated. There is mild left ventricular hypertrophy. Left ventricular diastolic parameters are consistent with Grade II diastolic dysfunction (pseudonormalization). Right Ventricle: The right ventricular size is normal. No increase in right ventricular wall thickness. Right ventricular systolic function is normal. Left Atrium: Left atrial size was normal in size. Right Atrium: Right atrial size was normal in size. Pericardium: There is no  evidence of pericardial effusion. Mitral Valve: The mitral valve is normal in structure. Trivial mitral valve regurgitation. MV peak gradient, 3.5 mmHg. The mean mitral valve gradient is 2.0 mmHg. Tricuspid Valve: The tricuspid valve is normal in structure. Tricuspid valve regurgitation is trivial. Aortic Valve: The aortic valve is normal in structure. Aortic valve regurgitation is not visualized. Aortic valve mean gradient measures 2.0 mmHg. Aortic valve peak gradient measures 3.7 mmHg. Aortic valve area, by VTI measures 3.03 cm. Pulmonic Valve: The pulmonic valve was normal in structure. Pulmonic valve regurgitation is not visualized. Aorta: The aortic root and ascending aorta are structurally normal, with no evidence of dilitation. IAS/Shunts: No atrial level shunt detected by color flow Doppler.  LEFT VENTRICLE PLAX 2D LVIDd:         5.81 cm      Diastology LVIDs:         5.17 cm      LV e' medial:    4.68 cm/s LV PW:         1.52 cm      LV E/e' medial:  7.6 LV IVS:        1.01 cm      LV e' lateral:   3.26 cm/s LVOT diam:     2.30 cm      LV E/e' lateral: 10.9 LV SV:         39 LV SV Index:   17 LVOT Area:     4.15 cm  LV Volumes (MOD) LV vol d, MOD A2C: 219.0 ml LV vol d, MOD A4C: 198.0 ml LV vol s, MOD A2C: 156.0 ml LV vol s, MOD A4C: 153.0 ml LV SV MOD A2C:     63.0 ml LV SV MOD A4C:     198.0 ml LV SV MOD BP:      56.7 ml RIGHT VENTRICLE RV Basal diam:  3.99 cm RV S prime:     15.40 cm/s LEFT ATRIUM             Index        RIGHT ATRIUM           Index LA diam:        4.30 cm 1.88 cm/m   RA Area:     15.20 cm LA Vol (A2C):   55.4 ml 24.27 ml/m  RA Volume:  34.90 ml  15.29 ml/m LA Vol (A4C):   48.2 ml 21.11 ml/m LA Biplane Vol: 52.1 ml 22.82 ml/m  AORTIC VALVE                    PULMONIC VALVE AV Area (Vmax):    2.63 cm     PV Vmax:          0.87 m/s AV Area (Vmean):   2.53 cm     PV Vmean:         63.300 cm/s AV Area (VTI):     3.03 cm     PV VTI:           0.127 m AV Vmax:           96.50 cm/s    PV Peak grad:     3.0 mmHg AV Vmean:          67.100 cm/s  PV Mean grad:     2.0 mmHg AV VTI:            0.127 m      PR End Diast Vel: 6.66 msec AV Peak Grad:      3.7 mmHg AV Mean Grad:      2.0 mmHg LVOT Vmax:         61.10 cm/s LVOT Vmean:        40.800 cm/s LVOT VTI:          0.093 m LVOT/AV VTI ratio: 0.73  AORTA Ao Root diam: 3.30 cm MITRAL VALVE MV Area (PHT): 7.99 cm    SHUNTS MV Area VTI:   2.25 cm    Systemic VTI:  0.09 m MV Peak grad:  3.5 mmHg    Systemic Diam: 2.30 cm MV Mean grad:  2.0 mmHg MV Vmax:       0.94 m/s MV Vmean:      67.2 cm/s MV Decel Time: 95 msec MV E velocity: 35.60 cm/s MV A velocity: 84.00 cm/s MV E/A ratio:  0.42 Serafina Royals MD Electronically signed by Serafina Royals MD Signature Date/Time: 03/23/2021/1:02:27 PM    Final     EKG: Sinus rhythm 85 bpm with nonspecific ST abnormalities  Telemetry reviewed by me: sinus tachycardia, rate 111  Echocardiogram 03/23/2020  1. Left ventricular ejection fraction, by estimation, is <20%. The left  ventricle has severely decreased function. The left ventricle demonstrates  global hypokinesis. The left ventricular internal cavity size was mildly  dilated. There is mild left  ventricular hypertrophy. Left ventricular diastolic parameters are  consistent with Grade II diastolic dysfunction (pseudonormalization).   2. Right ventricular systolic function is normal. The right ventricular  size is normal.   3. The mitral valve is normal in structure. Trivial mitral valve  regurgitation.   4. The aortic valve is normal in structure. Aortic valve regurgitation is  not visualized.   ASSESSMENT AND PLAN:   #Cardiopulmonary arrest -suspected 2/2 ventricular fibrillation requiring cardioversion, cardiac catheterization revealing insignificant coronary artery disease  -weaned off levophed gtt, proprofol weaning for brain MRI today. BP stable. -echocardiogram resulted with LVEF less than 20%, global hypokinesis, severely decreased  LV function. -No further cardiac diagnostics necessary at this time.   -We will advance GDMT as appropriate in patient's clinical course.  -Neurology consulted by primary team, appreciate their recommendations  #Respiratory failure -Requiring ventilatory support, supportive care as per primary team  #Essential hypertension -Holding home meds which include losartan-HCTZ 50-25mg     #Acute kidney injury with underlying mild  CKD -Cr 2.08 and GFR 38 on admission    Signed: Corey Skains md facc 03/25/2021, 7:56 AM

## 2021-03-26 ENCOUNTER — Inpatient Hospital Stay: Payer: BC Managed Care – PPO

## 2021-03-26 DIAGNOSIS — G931 Anoxic brain damage, not elsewhere classified: Secondary | ICD-10-CM

## 2021-03-26 LAB — COMPREHENSIVE METABOLIC PANEL
ALT: 44 U/L (ref 0–44)
AST: 31 U/L (ref 15–41)
Albumin: 3 g/dL — ABNORMAL LOW (ref 3.5–5.0)
Alkaline Phosphatase: 40 U/L (ref 38–126)
Anion gap: 8 (ref 5–15)
BUN: 65 mg/dL — ABNORMAL HIGH (ref 6–20)
CO2: 24 mmol/L (ref 22–32)
Calcium: 8.7 mg/dL — ABNORMAL LOW (ref 8.9–10.3)
Chloride: 110 mmol/L (ref 98–111)
Creatinine, Ser: 1.87 mg/dL — ABNORMAL HIGH (ref 0.61–1.24)
GFR, Estimated: 43 mL/min — ABNORMAL LOW (ref >60)
Glucose, Bld: 154 mg/dL — ABNORMAL HIGH (ref 70–99)
Potassium: 3.9 mmol/L (ref 3.5–5.1)
Sodium: 142 mmol/L (ref 135–145)
Total Bilirubin: 0.8 mg/dL (ref 0.3–1.2)
Total Protein: 6.8 g/dL (ref 6.5–8.1)

## 2021-03-26 LAB — GLUCOSE, CAPILLARY
Glucose-Capillary: 126 mg/dL — ABNORMAL HIGH (ref 70–99)
Glucose-Capillary: 131 mg/dL — ABNORMAL HIGH (ref 70–99)
Glucose-Capillary: 145 mg/dL — ABNORMAL HIGH (ref 70–99)
Glucose-Capillary: 148 mg/dL — ABNORMAL HIGH (ref 70–99)
Glucose-Capillary: 167 mg/dL — ABNORMAL HIGH (ref 70–99)

## 2021-03-26 LAB — CBC
HCT: 41.6 % (ref 39.0–52.0)
Hemoglobin: 13.5 g/dL (ref 13.0–17.0)
MCH: 30.1 pg (ref 26.0–34.0)
MCHC: 32.5 g/dL (ref 30.0–36.0)
MCV: 92.7 fL (ref 80.0–100.0)
Platelets: 199 10*3/uL (ref 150–400)
RBC: 4.49 MIL/uL (ref 4.22–5.81)
RDW: 14.2 % (ref 11.5–15.5)
WBC: 21.2 10*3/uL — ABNORMAL HIGH (ref 4.0–10.5)
nRBC: 0 % (ref 0.0–0.2)

## 2021-03-26 MED ORDER — CARVEDILOL 12.5 MG PO TABS
25.0000 mg | ORAL_TABLET | Freq: Two times a day (BID) | ORAL | Status: DC
  Administered 2021-03-26 – 2021-03-30 (×7): 25 mg
  Filled 2021-03-26 (×7): qty 2

## 2021-03-26 MED ORDER — HYDROMORPHONE HCL 1 MG/ML IJ SOLN
1.0000 mg | INTRAMUSCULAR | Status: DC | PRN
  Administered 2021-03-29: 15:00:00 1 mg via INTRAVENOUS

## 2021-03-26 MED ORDER — HYDRALAZINE HCL 50 MG PO TABS
25.0000 mg | ORAL_TABLET | Freq: Three times a day (TID) | ORAL | Status: DC
  Administered 2021-03-26 – 2021-03-30 (×13): 25 mg
  Filled 2021-03-26 (×13): qty 1

## 2021-03-26 MED ORDER — HYDROMORPHONE HCL 1 MG/ML IJ SOLN
1.0000 mg | INTRAMUSCULAR | Status: DC | PRN
  Administered 2021-03-26: 3 mg via INTRAVENOUS
  Administered 2021-03-27 – 2021-03-28 (×4): 2 mg via INTRAVENOUS
  Administered 2021-03-29 – 2021-03-30 (×3): 1 mg via INTRAVENOUS
  Filled 2021-03-26: qty 1
  Filled 2021-03-26 (×3): qty 2
  Filled 2021-03-26: qty 3
  Filled 2021-03-26 (×2): qty 1
  Filled 2021-03-26 (×3): qty 2

## 2021-03-26 MED ORDER — LABETALOL HCL 200 MG PO TABS
300.0000 mg | ORAL_TABLET | Freq: Three times a day (TID) | ORAL | Status: DC
  Administered 2021-03-26: 300 mg
  Filled 2021-03-26 (×3): qty 1

## 2021-03-26 MED ORDER — FREE WATER
100.0000 mL | Freq: Four times a day (QID) | Status: DC
  Administered 2021-03-26 – 2021-03-28 (×9): 100 mL

## 2021-03-26 MED ORDER — FREE WATER: 150.0000 mL | Freq: Four times a day (QID) | Status: DC

## 2021-03-26 MED ORDER — AMLODIPINE BESYLATE 10 MG PO TABS
10.0000 mg | ORAL_TABLET | Freq: Every day | ORAL | Status: DC
  Administered 2021-03-26: 10 mg
  Filled 2021-03-26: qty 1

## 2021-03-26 NOTE — Progress Notes (Addendum)
Neurology progress note  S: No acute events overnight. Patient underwent brain MRI that showed signs of anoxic brain injury without large areas of infarct. Sedation was turned off around 1015 this AM.  MRI brain wo contrast - hypoxic ischemic injury involving bilateral ACA-MCA watershed cortex, bilat occipital cortex and BG (personal review)  O:  Vitals:   03/26/21 0400 03/26/21 0500  BP: (!) 144/95 (!) 152/100  Pulse: 99 98  Resp: 18 (!) 7  Temp: 98.6 F (37 C) 98.6 F (37 C)  SpO2: 99% 96%     Physical Exam Gen: obtunded, briefly opens eyes to vigorous noxious stimuli Resp: ventilated CV: RRR   Neuro: GCS E2/V not testable/M1 *MS: obtunded, briefly opens eyes to vigorous noxious stimuli. Does not follow commands *Speech: intubated *CN: 15mm ERRL, (+) corneals and oculocephalics, (-) cough and gag, face symmetric at rest *Motor & sensory: no spontaneous movement; no motor response to noxious stimuli ina ny extremity, grimaces to noxious stimuli in RUE only *Reflexes: 1+ symmetric throughout toes mute bilat *Coordination, gait: UTA  A/P: This is a 51 year old gentleman with history of hypertension who is admitted after cardiac arrest 03/22/21 on whom neurology is consulted for prognostication.  Etiology of arrest is unclear, given insignificant CAD on cath cardiology suspects this was 2/2 primary VF event. He now does have severely depressed EF (not present on left ventriculography during cath) and is on heparin gtt for NSTEMI. He has some brainstem reflexes on exam and does flutter eyes in response to noxious stimuli but does not follow commands or have any motor response in his extremities to pain. MRI brain wo contrast does show e/o anoxic brain injury. When he is hemodynamically stable sedation should be weaned to allow for formal prognostication exam. I have told his wife that until he can be examined off sedation for at least 24 hours it is too soon to tell what his neurologic  prognosis is expected to be.  He is not having any twitching on my exam, recommend continuation of keppra for now. rEEG 1/19 did not show any epileptiform abnl.     Recommendations    - Continue keppra 500mg  bid - Plan for repeat neurologic exam tomorrow after patient has been off sedation for > 24 hrs ______________________________________________________________________      , MD [facility] [phone]   If 7pm- 7am, please page neurology on call as listed in AMION.

## 2021-03-26 NOTE — Progress Notes (Addendum)
NAME:  Billy Cherry, MRN:  [id number], DOB:  [birth year], LOS: 3 ADMISSION DATE:  03/23/2021, CONSULTATION DATE:  1/19 REFERRING MD:  Lorinda Creed, CHIEF COMPLAINT:  Found down, cardiac arrest   Brief Pt Description / Synopsis:  51 year old male with out-of-hospital V. fib cardiac arrest.  Cardiac catheterization without evidence of significant coronary artery disease. Now with concern for anoxic brain injury.  History of Present Illness:  51 y/o male was found down by his wife this evening with agonal respirations.  911 called, received out of hospital CPR, 1 shock with defibrillator, 2 doses of epinephrine.  Had return of spontaneous circulation.  Concern on his 12 lead EKG for anterolateral ST elevation so he was taken to the cath lab where his study showed no significant coronary artery disease and an LVEF 45-50%.   He remained encephalopathic post cardiac arrest so PCCM was consulted.    Family notes he rarely drinks alcohol, doesn't smoke, doesn't use illicit drugs.  Takes blood pressure medicine at home, may have run out recently.   Pertinent  Medical History  Hypertension  Micro Data:  1/19 SARS CoV2/Flu > negative  Antimicrobials:  Ceftriaxone 1/21>>  Significant Hospital Events: Including procedures, antibiotic start and stop dates in addition to other pertinent events   1/19 admission, left heart cath without significant CAD.  Concern for anoxic brain injury 1/20: MRI pending, Neuro following 1/21: MRI shows areas of hypoxic-ischemic injury bilaterally. Fevered; cultures obtained.  Interim History / Subjective:  Febrile yesterday. Cultures drawn and ceftriaxone started. Source is not readily apparent but aspiration pneumonia is favored; urine is bland. Stable leukocytosis. No significant change in neuro status as compared to yesterday. Dealing with some hypertension -- possibly pain related but possibly some component of discomfort/coughing is contributing. Urine output is  strong without diuresis, AKI is improving.  Objective   Blood pressure (!) 159/115, pulse 99, temperature 99 F (37.2 C), temperature source Esophageal, resp. rate 20, height 5' 10.98" (1.803 m), weight 110.5 kg, SpO2 96 %.    Vent Mode: PRVC FiO2 (%):  [30 %] 30 % Set Rate:  [20 bmp] 20 bmp Vt Set:  [500 mL] 500 mL PEEP:  [5 cmH20] 5 cmH20 Plateau Pressure:  [11 cmH20-20 cmH20] 17 cmH20   Intake/Output Summary (Last 24 hours) at 03/26/2021 8416 Last data filed at 03/26/2021 0600 Gross per 24 hour  Intake 1859.85 ml  Output 2600 ml  Net -740.15 ml   Filed Weights   03/24/21 0500 03/25/21 0500 03/26/21 0500  Weight: 112.6 kg 108.3 kg 110.5 kg    Examination:  General: ll-appearing male, laying in bed, intubated and sedated PULM: Clear to auscultation bilaterally, no wheezing or rales CV: RRR, no mgr GI: BS+, soft, nontender MSK: normal bulk and tone, no deformities, no edema Neuro: Brainstem reflexes intact (positive cough/gag/corneal reflexes, overbreathing the ventilator), pupils PERRLA, however does not withdraw from painful stimulation and extremities are flaccid  Resolved Hospital Problem list   Hypertensive emergency Lactic acidosis Elevated LFTs  Assessment & Plan:   V. Fib arrest, left heart cath without significant coronary artery disease Possible angioedema from ACE inhibitor Possible explanation is angioedema due to home lisinopril leading to airway obstruction with respiratory arrest. Primary arrhythmogenic etiology also very possible. -Cardiology consulted; appreciate input -LHC showed no obstructive culprit lesion -Echocardiogram 03/23/21>> LVEF <20% with global hypokinesis, Grade II diastolic dysfunction, RV function normal -Continuous cardiac monitoring -Urine drug screen negative -Serum Ethyl Alcohol negative -Completed heparin gtt  Acute Hypoxic Respiratory Failure  in the setting of cardiac arrest -Full vent support, implement lung protective  strategies -Plateau pressures less than 30 cm H20 -Wean FiO2 & PEEP as tolerated to maintain O2 sats >92% -Follow intermittent Chest X-ray & ABG as needed -Spontaneous Breathing Trials when respiratory parameters met and mental status permits -Implement VAP Bundle -Prn Bronchodilators  Sepsis, unclear etiology Aspiration pneumonia is suspected -Continue ceftriaxone -Follow up culture data -Trend fever curve, WBC count  Acute Metabolic Encephalopathy post cardiac arrest Hypoxic ischemic injury 2/2 cardiac arreast -Neurology consulted; appreciate input -RASS goal 0; minimize sedation -Daily wake up assessment -Normothermia protocol -Continue IV Keppra (no evidence for seizure on EKG) -Neuro checks per protocol   Acute Kidney Injury -Monitor I&O's / urinary output -Follow BMP -Ensure adequate renal perfusion -Avoid nephrotoxic agents as able -Replace electrolytes as indicated   Hypertension -Continue as needed sedation for comfort -Start Coreg, hydralazine  Hyperglycemia -CBG's q4h; Target range of 140 to 180 -SSI -Follow ICU Hypo/Hyperglycemia protocol  Pt is critically ill with clear evidence for anoxic brain injury.  Prognosis is guarded.  High risk for further cardiac arrest and death.   Pt's wife updated at bedside. All questions answered to the best of my ability.  Best Practice (right click and "Reselect all SmartList Selections" daily)   Diet/type: tubefeeds DVT prophylaxis: LMWH GI prophylaxis: PPI Lines: N/A Foley:  Yes, and it is still needed Code Status:  full code Last date of multidisciplinary goals of care discussion [1/21]  Labs   CBC: Recent Labs  Lab 03/23/21 0254 03/24/21 0513 03/25/21 0441 03/26/21 0534  WBC 12.6* 15.3* 20.7* 21.2*  NEUTROABS 5.8  --   --   --   HGB 14.8 15.7 14.4 13.5  HCT 45.2 47.9 43.7 41.6  MCV 93.2 91.9 91.8 92.7  PLT 228 193 201 027    Basic Metabolic Panel: Recent Labs  Lab 03/23/21 0254 03/23/21 0623  03/23/21 1723 03/24/21 0513 03/24/21 1700 03/25/21 0441 03/26/21 0534  NA 138  --   --  139  --  140 142  K 3.5  --   --  3.6  --  3.9 3.9  CL 104  --   --  105  --  104 110  CO2 20*  --   --  26  --  24 24  GLUCOSE 249*  --   --  95  --  118* 154*  BUN 26*  --   --  38*  --  58* 65*  CREATININE 2.08*  --   --  1.91*  --  2.53* 1.87*  CALCIUM 8.4*  --   --  8.4*  --  8.5* 8.7*  MG  --  2.4 2.4 2.3 2.2  --   --   PHOS  --  2.5 3.0 4.2 5.0*  --   --    GFR: Estimated Creatinine Clearance: 59.8 mL/min (A) (by C-G formula based on SCr of 1.87 mg/dL (H)). Recent Labs  Lab 03/23/21 0254 03/24/21 0513 03/25/21 0441 03/26/21 0534  WBC 12.6* 15.3* 20.7* 21.2*    Liver Function Tests: Recent Labs  Lab 03/23/21 0254 03/24/21 0513 03/25/21 0441 03/26/21 0534  AST 150* 123* 56* 31  ALT 130* 92* 63* 44  ALKPHOS 74 43 43 40  BILITOT 1.2 1.1 1.0 0.8  PROT 7.2 7.1 6.8 6.8  ALBUMIN 3.8 3.5 3.4* 3.0*   No results for input(s): LIPASE, AMYLASE in the last 168 hours. No results for input(s): AMMONIA in the last  168 hours.  ABG    Component Value Date/Time   PHART 7.43 03/24/2021 0500   PCO2ART 36 03/24/2021 0500   PO2ART 111 (H) 03/24/2021 0500   HCO3 23.9 03/24/2021 0500   ACIDBASEDEF 0.3 03/23/2021 0556   O2SAT 98.5 03/24/2021 0500     Coagulation Profile: Recent Labs  Lab 03/23/21 0254  INR 1.0    Cardiac Enzymes: No results for input(s): CKTOTAL, CKMB, CKMBINDEX, TROPONINI in the last 168 hours.  HbA1C: Hgb A1c MFr Bld  Date/Time Value Ref Range Status  03/23/2021 06:23 AM 5.4 4.8 - 5.6 % Final    Comment:    (NOTE) Pre diabetes:          5.7%-6.4%  Diabetes:              >6.4%  Glycemic control for   <7.0% adults with diabetes   03/23/2021 02:54 AM 5.5 4.8 - 5.6 % Final    Comment:    DUPLICATE AT 6579 03/83/3383. THE NEXT SAMPLE WAS CREDITED. (NOTE) Pre diabetes:          5.7%-6.4%  Diabetes:              >6.4%  Glycemic control for    <7.0% adults with diabetes     CBG: Recent Labs  Lab 03/25/21 1600 03/25/21 2101 03/26/21 0020 03/26/21 0400 03/26/21 0725  GLUCAP 118* 139* 148* 167* 131*    Review of Systems:   Cannot obtain due to intubation, AMS  Past Medical History:  He,  has a past medical history of Hypertension.   Surgical History:   Past Surgical History:  Procedure Laterality Date   BACK SURGERY     CORONARY/GRAFT ACUTE MI REVASCULARIZATION N/A 03/23/2021   Procedure: Coronary/Graft Acute MI Revascularization;  Surgeon: Isaias Cowman, MD;  Location: [place] [facility];  Service: Cardiovascular;  Laterality: N/A;   LEFT HEART CATH AND CORONARY ANGIOGRAPHY N/A 03/23/2021   Procedure: LEFT HEART CATH AND CORONARY ANGIOGRAPHY;  Surgeon: Isaias Cowman, MD;  Location: [place] [facility];  Service: Cardiovascular;  Laterality: N/A;   SHOULDER SURGERY       Social History:   reports that he has never smoked. He has never used smokeless tobacco. He reports current alcohol use. He reports that he does not use drugs.   Family History:  His family history is not on file.   Allergies Allergies  Allergen Reactions   Lisinopril Swelling    angioedema     Home Medications  Prior to Admission medications   Medication Sig Start Date End Date Taking? Authorizing Provider  lisinopril (PRINIVIL,ZESTRIL) 10 MG tablet Take 10 mg by mouth daily.  11/12/16   [provider]  lisinopril (ZESTRIL) 10 MG tablet Take 1 tablet (10 mg total) by mouth daily. 12/06/19 12/05/20  Johnn Hai, PA-C     Critical care time: 31 minutes    Levie Wages Sharene Butters, MD 03/26/21 7:33 AM   [facility] Prefer epic messenger for cross cover needs If after hours, please call E-link

## 2021-03-26 NOTE — Progress Notes (Signed)
[facility] Cardiology  CARDIOLOGY CONSULT NOTE  Patient ID: Billy Cherry MRN: [id number] DOB/AGE: [birth year] 51 y.o.  Admit date: 03/23/2021 Referring Physician [facility] Primary Physician Mena Goes [facility] (last seen 07/2019) Primary Cardiologist  Reason for Consultation cardiopulmonary arrest  HPI: 51 year old gentleman with a PMH significant for hypertension and hyperlipidemia referred for cardiopulmonary arrest with suspicion for ST elevation myocardial infarction.  Patient has a history of essential hypertension.  Patient was sleeping, found by his wife agonal respirations, called EMS, and performed bystander CPR.  On EMS arrival patient was found to be in ventricular fibrillation and defibrillated x1, converted to asystole given 2 rounds of epinephrine.  Post defibrillation ECG revealed sinus tachycardia with right bundle branch block and apparent ST elevation in anterolateral leads.  Following arrival at [facility] ED, patient was intubated.  ECG in the ED revealed minus rhythm 85 bpm with nonspecific ST-T abnormalities the anterolateral leads.  The patient was brought to the cardiac catheterization laboratory coronary angiography revealed insignificant coronary artery disease.  Left ventriculography revealed mild left ventricular dysfunction with estimated LVEF 45 to 50%.  Interval history: -no acute events.  Blood pressure has had some elevations when he has been uncomfortable with less sedation but easily taken care of by intravenous labetalol -Short run of nonsustained ventricular tachycardia by telemetry 2 days ago but none since -No other cardiovascular concerns at this time -History suggest that the patient may have had lisinopril induced angioedema causing tongue swelling and throat obstruction with hypoxia as a primary cause of his event  Review of systems unable to be assessed due to patient's sedation.    Past Medical History:  Diagnosis Date   Hypertension     Past Surgical  History:  Procedure Laterality Date   BACK SURGERY     CORONARY/GRAFT ACUTE MI REVASCULARIZATION N/A 03/23/2021   Procedure: Coronary/Graft Acute MI Revascularization;  Surgeon: Isaias Cowman, MD;  Location: [facility];  Service: Cardiovascular;  Laterality: N/A;   LEFT HEART CATH AND CORONARY ANGIOGRAPHY N/A 03/23/2021   Procedure: LEFT HEART CATH AND CORONARY ANGIOGRAPHY;  Surgeon: Isaias Cowman, MD;  Location: [facility];  Service: Cardiovascular;  Laterality: N/A;   SHOULDER SURGERY      Medications Prior to Admission  Medication Sig Dispense Refill Last Dose   lisinopril (PRINIVIL,ZESTRIL) 10 MG tablet Take 10 mg by mouth daily.    03/22/2021   sildenafil (VIAGRA) 25 MG tablet Take by mouth as directed.   Past Month   lisinopril (ZESTRIL) 10 MG tablet Take 1 tablet (10 mg total) by mouth daily. 30 tablet 1     Social History   Socioeconomic History   Marital status: Married    Spouse name: Not on file   Number of children: Not on file   Years of education: Not on file   Highest education level: Not on file  Occupational History   Not on file  Tobacco Use   Smoking status: Never   Smokeless tobacco: Never  Substance and Sexual Activity   Alcohol use: Yes   Drug use: Never   Sexual activity: Not on file  Other Topics Concern   Not on file  Social History Narrative   Not on file   Social Determinants of Health   Financial Resource Strain: Not on file  Food Insecurity: Not on file  Transportation Needs: Not on file  Physical Activity: Not on file  Stress: Not on file  Social Connections: Not on file  Intimate Partner  Violence: Not on file    No family history on file.   Review of systems complete and found to be negative unless listed above   PHYSICAL EXAM General: Black male intubated and sedated, eyes closed. Two aunts at bedside. HEENT:  Normocephalic and atraumatic.  Neck:  No JVD.  Lungs: intubated. Clear bilaterally to  auscultation anteriorly Heart: HRRR . Normal S1 and S2 without gallops or murmurs.  Abdomen: Nondistended appearing Msk: Unable to assess Extremities: No clubbing, cyanosis or edema.   Neuro: Unable to assess Psych: Unable to assess  Labs:   Lab Results  Component Value Date   WBC 21.2 (H) 03/26/2021   HGB 13.5 03/26/2021   HCT 41.6 03/26/2021   MCV 92.7 03/26/2021   PLT 199 03/26/2021    Recent Labs  Lab 03/26/21 0534  NA 142  K 3.9  CL 110  CO2 24  BUN 65*  CREATININE 1.87*  CALCIUM 8.7*  PROT 6.8  BILITOT 0.8  ALKPHOS 40  ALT 44  AST 31  GLUCOSE 154*    No results found for: CKTOTAL, CKMB, CKMBINDEX, TROPONINI  Lab Results  Component Value Date   CHOL 254 (H) 03/23/2021   Lab Results  Component Value Date   HDL 52 03/23/2021   Lab Results  Component Value Date   LDLCALC 174 (H) 03/23/2021   Lab Results  Component Value Date   TRIG 142 03/24/2021   TRIG 138 03/23/2021   Lab Results  Component Value Date   CHOLHDL 4.9 03/23/2021   No results found for: LDLDIRECT    Radiology: DG Chest 1 View  Result Date: 03/23/2021 CLINICAL DATA:  51 year old male status post cardiopulmonary arrest, out of hospital CPR. Altered mental status. EXAM: CHEST  1 VIEW COMPARISON:  Portable chest 0308 hours. FINDINGS: Portable AP upright view at 0458 hours. Endotracheal tube tip in good position between the clavicles and carina. Enteric tube terminates in the left upper quadrant. Pacer pads over the left lower chest. Normal cardiac size and mediastinal contours. Allowing for portable technique the lungs are clear. Visible bowel-gas within normal limits. No displaced rib fracture. IMPRESSION: 1. Satisfactory ET tube and enteric tube. 2.  No acute cardiopulmonary abnormality. Electronically Signed   By: Genevie Ann M.D.   On: 03/23/2021 06:05   DG Abd 1 View  Result Date: 03/23/2021 CLINICAL DATA:  Evaluate OG tube placement. EXAM: ABDOMEN - 1 VIEW COMPARISON:  None. FINDINGS:  The OG tube courses below the level of the GE junction. The tip of the OG tube is in the expected location of the gastric fundus with side port well below the GE junction. Single borderline dilated loop of small bowel noted in the left lower quadrant of the abdomen with normal caliber colon. IMPRESSION: OG tube tip is in the gastric fundus with side port well below the GE junction. Electronically Signed   By: Kerby Moors M.D.   On: 03/23/2021 05:16   CT HEAD WO CONTRAST (5MM)  Result Date: 03/23/2021 CLINICAL DATA:  51 year old male status post cardiopulmonary arrest, out of hospital CPR. Altered mental status. EXAM: CT HEAD WITHOUT CONTRAST TECHNIQUE: Contiguous axial images were obtained from the base of the skull through the vertex without intravenous contrast. RADIATION DOSE REDUCTION: This exam was performed according to the departmental dose-optimization program which includes automated exposure control, adjustment of the mA and/or kV according to patient size and/or use of iterative reconstruction technique. COMPARISON:  None. FINDINGS: Brain: Small round hypodensity at the genu  of the left internal capsule on series 2, image 15. Elsewhere gray-white matter differentiation is preserved and within normal limits. However, there is questionable generalized loss of cerebral sulci. But basilar cisterns appear normal. Normal cerebral volume. No midline shift, ventriculomegaly, mass effect, evidence of mass lesion, intracranial hemorrhage or evidence of cortically based acute infarction. Vascular: Mild generalized intracranial vascular hyperdensity, perhaps related to hemoconcentration. No suspicious intracranial vascular hyperdensity. Skull: No acute osseous abnormality identified. Sinuses/Orbits: Intubated on the scout view. Fluid in the visible pharynx. Right mastoid effusion. Right tympanic cavity remains clear. Mild left mastoid effusion. Paranasal sinuses remain well aerated. Other: No acute orbit or  scalp soft tissue finding. IMPRESSION: 1. Difficult without a prior study to exclude the possibility of anoxic injury; questionable loss of cerebral sulci throughout the brain, but the gray-white matter differentiation is preserved. If decreased mental status persists then repeat Head CT in 12-24 hours or noncontrast brain MRI would be valuable. 2. 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 No intracranial hemorrhage or mass effect. 3. Intubated, with mastoid air cell effusions. Electronically Signed   By: Genevie Ann M.D.   On: 03/23/2021 06:04   MR BRAIN WO CONTRAST  Result Date: 03/24/2021 CLINICAL DATA:  Anoxic brain damage. EXAM: MRI HEAD WITHOUT CONTRAST TECHNIQUE: Multiplanar, multiecho pulse sequences of the brain and surrounding structures were obtained without intravenous contrast. COMPARISON:  Head CT March 23, 2021 FINDINGS: Brain: Cortical restricted diffusion along the bilateral ACA-MCA watershed, bilateral occipital lobes, posterior cingulate, bilateral caudate and lentiform nucleus, particularly posterior aspect of the putamen, consistent with hypoxic ischemic injury. Questionable restricted diffusion in the tail of left hippocampus. Punctate foci of restricted diffusion are also seen in the left cerebellar hemisphere, right posterior temporal lobe, medial right frontal lobe and left occipital lobe. Posterior fossa structures are otherwise maintained. No significant mass effect, hydrocephalus, hemorrhage, extra-axial collection or mass lesion. Remote lacunar infarct at the genu of the left internal capsule. A few scattered foci of T2 hyperintensity within the white matter of the cerebral hemispheres and periventricular white matter of the left temporal lobe, nonspecific. Vascular: Normal flow voids. Skull and upper cervical spine: No focal marrow lesion. Sinuses/Orbits: Fluid level within the right maxillary sinus and bilateral mastoid effusion may be related to endotracheal tube. The  orbits are maintained. Other: None. 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 Electronically Signed   By: Pedro Earls M.D.   On: 03/24/2021 13:53   CARDIAC CATHETERIZATION  Result Date: 03/23/2021   Dist RCA lesion is 20% stenosed.   There is mild left ventricular systolic dysfunction.   The left ventricular ejection fraction is 45-50% by visual estimate. 1.  Insignificant coronary artery disease 2.  Mild reduced left ventricular function with estimated LV ejection fraction 45 to 50% Recommendations 1.  Admit to ICU for ventilator management 2.  2D echocardiogram   DG Chest Port 1 View  Result Date: 03/25/2021 CLINICAL DATA:  Acute respiratory failure with hypoxia. EXAM: PORTABLE CHEST 1 VIEW COMPARISON:  03/24/2021 FINDINGS: ET tube tip is above the carina. There is a NG tube with tip and side port below the GE junction. Temperature probe is noted with tip terminating in the midesophagus. Heart size normal. No pleural effusion or edema. Lung volumes are low. No airspace opacities. IMPRESSION: 1. Support apparatus positioned as above. 2. No acute cardiopulmonary abnormalities noted. Electronically Signed   By: Queen Slough.D.  On: 03/25/2021 08:04   DG Chest Port 1 View  Result Date: 03/24/2021 CLINICAL DATA:  Acute respiratory failure, hypoxia EXAM: PORTABLE CHEST 1 VIEW COMPARISON:  Chest radiograph 1 day prior FINDINGS: The endotracheal tube tip is approximately 3.1 cm from the carina. The enteric catheter is coiled in the stomach. A presumed esophageal temperature probe terminates in the mid thorax. The cardiomediastinal silhouette is stable. Lung aeration is unchanged. There is no new or worsening focal airspace disease. There is no pleural effusion or pneumothorax. There is a mildly displaced fracture of the left fifth rib possibly related to CPR. IMPRESSION:  Mildly displaced fracture of the left fifth rib which may be related to CPR. Otherwise, stable chest with no radiographic evidence of acute cardiopulmonary process. Electronically Signed   By: Valetta Mole M.D.   On: 03/24/2021 07:21   DG Chest Portable 1 View  Result Date: 03/23/2021 CLINICAL DATA:  Status post intubation. EXAM: PORTABLE CHEST 1 VIEW COMPARISON:  None. FINDINGS: Overlying radiopaque cardiac lead wires and tags are seen. An endotracheal tube is seen with its distal tip approximately 0.9 cm from the carina. Mildly decreased lung volumes are noted. Mild atelectasis and/or infiltrate is seen along the infrahilar region of the right lung base. There is no evidence of a pleural effusion or pneumothorax. The heart size and mediastinal contours are within normal limits. The visualized skeletal structures are unremarkable. IMPRESSION: 1. Endotracheal tube with its distal tip approximately 0.9 cm from the carina. 2. Mild right basilar atelectasis and/or infiltrate. Electronically Signed   By: Virgina Norfolk M.D.   On: 03/23/2021 03:30   EEG adult  Result Date: 03/23/2021 Derek Jack, MD     03/23/2021  7:35 PM Routine EEG Report Billy Cherry is a 50 y.o. male with a history of cardiac arrest who is undergoing an EEG to evaluate for seizures. Report: This EEG was acquired with electrodes placed according to the International 10-20 electrode system (including Fp1, Fp2, F3, F4, C3, C4, P3, P4, O1, O2, T3, T4, T5, T6, A1, A2, Fz, Cz, Pz). The following electrodes were missing or displaced: none. There was no clear occipital dominant rhythm. The best background was low amplitude theta which was reactive to stimulation. At the beginning of the recording propofol had just been paused and there were intervening periods of diffuse suppression. Towards the end of the recording as propofol wore off background became near continuous. There was no focal slowing. There was no sleep architecture observed.  There were no interictal epileptiform discharges. There were no electrographic seizures identified. Photic stimulation and hyperventilation were not performed. Impression and clinical correlation: This EEG was obtained while comatose and is abnormal due to: - Moderate diffuse slowing indicative of global cerebral dysfunction - Initial burst suppression pattern which abated as propofol wore off and is favored to be largely (or entirely) secondary to medication effect Su Monks, MD [facility] [phone] If 7pm- 7am, please page neurology on call as listed in [place].   ECHOCARDIOGRAM COMPLETE  Result Date: 03/23/2021    ECHOCARDIOGRAM REPORT   Patient Name:   Billy Cherry Date of Exam: 03/23/2021 Medical Rec #:  [id number]       Height:       71.0 in Accession #:    [id number]      Weight:       241.0 lb Date of Birth:  [birth year]       BSA:          2.283 m  Patient Age:    89 years        BP:           127/96 mmHg Patient Gender: M               HR:           105 bpm. Exam Location:  [facility] Procedure: 2D Echo, Color Doppler and Cardiac Doppler Indications:     I46.9 Cardiac arrest  History:         Patient has no prior history of Echocardiogram examinations.                  Risk Factors:Hypertension.  Sonographer:     Charmayne Sheer Referring Phys:  [place]son Diagnosing Phys: Serafina Royals MD  Sonographer Comments: Echo performed with patient supine and on artificial respirator. IMPRESSIONS  1. Left ventricular ejection fraction, by estimation, is <20%. The left ventricle has severely decreased function. The left ventricle demonstrates global hypokinesis. The left ventricular internal cavity size was mildly dilated. There is mild left ventricular hypertrophy. Left ventricular diastolic parameters are consistent with Grade II diastolic dysfunction (pseudonormalization).  2. Right ventricular systolic function is normal. The right ventricular size is normal.  3. The mitral valve is  normal in structure. Trivial mitral valve regurgitation.  4. The aortic valve is normal in structure. Aortic valve regurgitation is not visualized. FINDINGS  Left Ventricle: Left ventricular ejection fraction, by estimation, is <20%. The left ventricle has severely decreased function. The left ventricle demonstrates global hypokinesis. The left ventricular internal cavity size was mildly dilated. There is mild left ventricular hypertrophy. Left ventricular diastolic parameters are consistent with Grade II diastolic dysfunction (pseudonormalization). Right Ventricle: The right ventricular size is normal. No increase in right ventricular wall thickness. Right ventricular systolic function is normal. Left Atrium: Left atrial size was normal in size. Right Atrium: Right atrial size was normal in size. Pericardium: There is no evidence of pericardial effusion. Mitral Valve: The mitral valve is normal in structure. Trivial mitral valve regurgitation. MV peak gradient, 3.5 mmHg. The mean mitral valve gradient is 2.0 mmHg. Tricuspid Valve: The tricuspid valve is normal in structure. Tricuspid valve regurgitation is trivial. Aortic Valve: The aortic valve is normal in structure. Aortic valve regurgitation is not visualized. Aortic valve mean gradient measures 2.0 mmHg. Aortic valve peak gradient measures 3.7 mmHg. Aortic valve area, by VTI measures 3.03 cm. Pulmonic Valve: The pulmonic valve was normal in structure. Pulmonic valve regurgitation is not visualized. Aorta: The aortic root and ascending aorta are structurally normal, with no evidence of dilitation. IAS/Shunts: No atrial level shunt detected by color flow Doppler.  LEFT VENTRICLE PLAX 2D LVIDd:         5.81 cm      Diastology LVIDs:         5.17 cm      LV e' medial:    4.68 cm/s LV PW:         1.52 cm      LV E/e' medial:  7.6 LV IVS:        1.01 cm      LV e' lateral:   3.26 cm/s LVOT diam:     2.30 cm      LV E/e' lateral: 10.9 LV SV:         39 LV SV Index:    17 LVOT Area:     4.15 cm  LV Volumes (MOD) LV vol d, MOD A2C: 219.0 ml  LV vol d, MOD A4C: 198.0 ml LV vol s, MOD A2C: 156.0 ml LV vol s, MOD A4C: 153.0 ml LV SV MOD A2C:     63.0 ml LV SV MOD A4C:     198.0 ml LV SV MOD BP:      56.7 ml RIGHT VENTRICLE RV Basal diam:  3.99 cm RV S prime:     15.40 cm/s LEFT ATRIUM             Index        RIGHT ATRIUM           Index LA diam:        4.30 cm 1.88 cm/m   RA Area:     15.20 cm LA Vol (A2C):   55.4 ml 24.27 ml/m  RA Volume:   34.90 ml  15.29 ml/m LA Vol (A4C):   48.2 ml 21.11 ml/m LA Biplane Vol: 52.1 ml 22.82 ml/m  AORTIC VALVE                    PULMONIC VALVE AV Area (Vmax):    2.63 cm     PV Vmax:          0.87 m/s AV Area (Vmean):   2.53 cm     PV Vmean:         63.300 cm/s AV Area (VTI):     3.03 cm     PV VTI:           0.127 m AV Vmax:           96.50 cm/s   PV Peak grad:     3.0 mmHg AV Vmean:          67.100 cm/s  PV Mean grad:     2.0 mmHg AV VTI:            0.127 m      PR End Diast Vel: 6.66 msec AV Peak Grad:      3.7 mmHg AV Mean Grad:      2.0 mmHg LVOT Vmax:         61.10 cm/s LVOT Vmean:        40.800 cm/s LVOT VTI:          0.093 m LVOT/AV VTI ratio: 0.73  AORTA Ao Root diam: 3.30 cm MITRAL VALVE MV Area (PHT): 7.99 cm    SHUNTS MV Area VTI:   2.25 cm    Systemic VTI:  0.09 m MV Peak grad:  3.5 mmHg    Systemic Diam: 2.30 cm MV Mean grad:  2.0 mmHg MV Vmax:       0.94 m/s MV Vmean:      67.2 cm/s MV Decel Time: 95 msec MV E velocity: 35.60 cm/s MV A velocity: 84.00 cm/s MV E/A ratio:  0.42 Serafina Royals MD Electronically signed by Serafina Royals MD Signature Date/Time: 03/23/2021/1:02:27 PM    Final     EKG: Sinus rhythm 85 bpm with nonspecific ST abnormalities  Telemetry reviewed by me: sinus tachycardia, rate 111  Echocardiogram 03/23/2020  1. Left ventricular ejection fraction, by estimation, is <20%. The left  ventricle has severely decreased function. The left ventricle demonstrates  global hypokinesis. The left ventricular  internal cavity size was mildly  dilated. There is mild left  ventricular hypertrophy. Left ventricular diastolic parameters are  consistent with Grade II diastolic dysfunction (pseudonormalization).   2. Right ventricular systolic function is normal. The right ventricular  size is normal.  3. The mitral valve is normal in structure. Trivial mitral valve  regurgitation.   4. The aortic valve is normal in structure. Aortic valve regurgitation is  not visualized.   ASSESSMENT AND PLAN:   #Cardiopulmonary arrest -suspected 2/2 ventricular fibrillation requiring cardioversion, cardiac catheterization revealing insignificant coronary artery disease yet of the primary cause likely was hypoxia due to angioedema from lisinopril use -weaned off levophed gtt, proprofol weaning for brain MRI  BP stable status post labetalol intravenously. -echocardiogram resulted with LVEF less than 20%, global hypokinesis, severely decreased LV function which may improve slowly depending on oxygenation. -No further cardiac diagnostics necessary at this time but would consider repeat echocardiogram and later hospitalization if necessary to assess for risk stratification.   -We will advance GDMT as appropriate in patient's clinical course.  -Neurology consulted by primary team, appreciate their recommendations  #Respiratory failure -Requiring ventilatory support, supportive care as per primary team  #Essential hypertension -Holding home meds which include losartan-HCTZ 50-25mg  due to possible side effects of those medications  #Acute kidney injury with underlying mild CKD -Cr 2.08 and GFR 38 on admission    Signed: Corey Skains md facc 03/26/2021, 7:40 AM

## 2021-03-26 NOTE — Progress Notes (Signed)
Neurology progress note  S: Patient developed fever yesterday PM and was started on empiric abx for VAP. He has been off sedation today but required multiple boluses overnight for HTN and apparent discomfort.  MRI brain wo contrast - hypoxic ischemic injury involving bilateral ACA-MCA watershed cortex, bilat occipital cortex and BG (personal review)  O:  Vitals:   03/26/21 1900 03/26/21 2000  BP: 125/83 (!) 139/122  Pulse: 78 71  Resp: 20 20  Temp: 98.4 F (36.9 C) 97.9 F (36.6 C)  SpO2: 100% 100%     Physical Exam Gen: opens eyes to voice Resp: ventilated CV: RRR   Neuro: *MS: opens eyes to voice. Does not follow commands *Speech: intubated *CN: 43mm ERRL, (+) corneals, oculocephalics, cough, gag. Roving eye movements. *Motor & sensory: no spontaneous movement; no motor response to noxious stimuli ina ny extremity, grimaces to noxious stimuli in RUE only *Reflexes: 1+ symmetric throughout toes mute bilat *Coordination, gait: UTA  A/P: This is a 51 year old gentleman with history of hypertension who is admitted after cardiac arrest 03/22/21 on whom neurology is consulted for prognostication.  Etiology of arrest is unclear, given insignificant CAD on cath cardiology suspects this was 2/2 primary VF event vs possible angioedema 2/2 his lisinopril. He now does have severely depressed EF (not present on left ventriculography during cath) and has now completed heparin gtt for NSTEMI. He has intact brainstem reflexes on exam and does flutter eyes in response to voice but does not follow commands or have any motor response in his extremities to pain. MRI brain wo contrast does show e/o anoxic brain injury.   He is not having any twitching on my exam, recommend continuation of keppra for now. rEEG 1/19 did not show any epileptiform abnl.  Formal prognostication exam has been delayed 2/2 patient requiring sedation within past 24 hours and new infection past 24 hrs (VAP).   Recommendations     - Continue keppra 500mg  bid - Repeat EEG in AM 2/2 roving eye movements, persistent inability to follow commands - Plan for repeat neurologic exam tomorrow after patient has been off sedation for > 24 hrs ______________________________________________________________________      , MD [facility] [phone]   If 7pm- 7am, please page neurology on call as listed in [place].

## 2021-03-26 NOTE — Plan of Care (Signed)
Continuing with plan of care. 

## 2021-03-26 NOTE — Consult Note (Signed)
PHARMACY CONSULT NOTE Pharmacy Consult for Electrolyte Monitoring and Replacement   Recent Labs: Potassium (mmol/L)  Date Value  03/26/2021 3.9   Magnesium (mg/dL)  Date Value  69/67/8938 2.2   Calcium (mg/dL)  Date Value  12/19/5100 8.7 (L)   Albumin (g/dL)  Date Value  58/52/7782 3.0 (L)   Phosphorus (mg/dL)  Date Value  42/35/3614 5.0 (H)   Sodium (mmol/L)  Date Value  03/26/2021 142   Assessment: 51 year old male with hypertension BIB EMS for STEMI found to be in Vfib on arrival. Patient converted to asystole after defibrillation by EMS, but subsequently with ROSC after CPR/epi. He was intubated in the ED. Patient was taken to the cath lab and was found to have no significant CAD and an LVEF <20%. Patient was then transferred to the CCU. Pharmacy has been consulted to monitor and replace electrolytes.   Nutrition: tube feeds  Goal of Therapy:  Potassium ~ 4, magnesium ~ 2, all other electrolytes within normal limits  Plan:  --Creatinine now improving --No replacement indicated today --Will continue to monitor with labs tomorrow  Pricilla Riffle, PharmD, BCPS Clinical Pharmacist 03/26/2021 10:10 AM

## 2021-03-27 LAB — CBC
HCT: 39.7 % (ref 39.0–52.0)
Hemoglobin: 12.6 g/dL — ABNORMAL LOW (ref 13.0–17.0)
MCH: 29.6 pg (ref 26.0–34.0)
MCHC: 31.7 g/dL (ref 30.0–36.0)
MCV: 93.2 fL (ref 80.0–100.0)
Platelets: 214 10*3/uL (ref 150–400)
RBC: 4.26 MIL/uL (ref 4.22–5.81)
RDW: 14.5 % (ref 11.5–15.5)
WBC: 15.9 10*3/uL — ABNORMAL HIGH (ref 4.0–10.5)
nRBC: 0 % (ref 0.0–0.2)

## 2021-03-27 LAB — GLUCOSE, CAPILLARY
Glucose-Capillary: 108 mg/dL — ABNORMAL HIGH (ref 70–99)
Glucose-Capillary: 130 mg/dL — ABNORMAL HIGH (ref 70–99)

## 2021-03-27 LAB — BASIC METABOLIC PANEL
Anion gap: 8 (ref 5–15)
BUN: 74 mg/dL — ABNORMAL HIGH (ref 6–20)
CO2: 25 mmol/L (ref 22–32)
Calcium: 8.8 mg/dL — ABNORMAL LOW (ref 8.9–10.3)
Chloride: 115 mmol/L — ABNORMAL HIGH (ref 98–111)
Creatinine, Ser: 1.74 mg/dL — ABNORMAL HIGH (ref 0.61–1.24)
GFR, Estimated: 47 mL/min — ABNORMAL LOW (ref >60)
Glucose, Bld: 129 mg/dL — ABNORMAL HIGH (ref 70–99)
Potassium: 4 mmol/L (ref 3.5–5.1)
Sodium: 148 mmol/L — ABNORMAL HIGH (ref 135–145)

## 2021-03-27 LAB — PHOSPHORUS: Phosphorus: 4.5 mg/dL (ref 2.5–4.6)

## 2021-03-27 LAB — MAGNESIUM: Magnesium: 2.9 mg/dL — ABNORMAL HIGH (ref 1.7–2.4)

## 2021-03-27 MED ORDER — VITAL HIGH PROTEIN PO LIQD
1000.0000 mL | ORAL | Status: DC
  Administered 2021-03-28: 09:00:00 1000 mL

## 2021-03-27 MED ORDER — ISOSORBIDE MONONITRATE ER 30 MG PO TB24: 30.0000 mg | ORAL_TABLET | Freq: Every day | ORAL | Status: DC

## 2021-03-27 MED ORDER — POLYETHYLENE GLYCOL 3350 17 G PO PACK: 17.0000 g | PACK | Freq: Every day | ORAL | Status: DC | PRN

## 2021-03-27 MED ORDER — DOCUSATE SODIUM 50 MG/5ML PO LIQD: 100.0000 mg | Freq: Two times a day (BID) | ORAL | Status: DC | PRN

## 2021-03-27 MED ORDER — PROSOURCE TF PO LIQD
45.0000 mL | Freq: Two times a day (BID) | ORAL | Status: DC
  Administered 2021-03-28 – 2021-03-30 (×4): 45 mL
  Filled 2021-03-27 (×6): qty 45

## 2021-03-27 MED ORDER — HYDRALAZINE HCL 20 MG/ML IJ SOLN: 10.0000 mg | INTRAMUSCULAR | Status: DC | PRN

## 2021-03-27 MED ORDER — ISOSORBIDE DINITRATE 10 MG PO TABS
10.0000 mg | ORAL_TABLET | Freq: Three times a day (TID) | ORAL | Status: DC
  Administered 2021-03-27 – 2021-03-28 (×6): 10 mg
  Filled 2021-03-27 (×7): qty 1

## 2021-03-27 NOTE — Progress Notes (Signed)
Eeg done 

## 2021-03-27 NOTE — Progress Notes (Addendum)
Nutrition Follow Up Note   DOCUMENTATION CODES:   Obesity unspecified  INTERVENTION:   Increase Vital HP to 93ml/hr + ProSource TF 63ml BID via tube   Free water flushes q4 hours   Regimen provides 1640kcal/day, 159g/day protein and 196ml/day free water   NUTRITION DIAGNOSIS:   Inadequate oral intake related to inability to eat (pt sedated and ventilated) as evidenced by NPO status.  GOAL:   Provide needs based on ASPEN/SCCM guidelines  MONITOR:   PO intake, Supplement acceptance, Labs, Weight trends, Skin, I & O's  REASON FOR ASSESSMENT:   Consult Enteral/tube feeding initiation and management  ASSESSMENT:   51 y/o male with h/o HTN who is admitted after cardiopulmonary arrest with reports of tongue swelling and initial suspicion for ST elevation myocardial infarction now s/p negative L heart cath. Pt also noted to have aspiration PNA and possible brain injury.  Pt remains sedated and ventilated. OGT in place; pt tolerating tube feeds well at goal rate. Pt with diarrhea today; flexiseal in place. Bowel regimen discontinued. Pt with elevated sodium today; will hold on adjusting free water as new regimen will provide more per day. Per chart, pt down ~2lbs since admission. Pt - 3.5L on his I & Os. Per MD note, pt will likely need trach and PEG.    Medications reviewed and include: colace, lovenox, insulin, MVI, protonix, miralax, ceftriaxone  Labs reviewed: Na 148(H), K 4.0 wnl, BUN 74(H), creat 1.74(H), P 4.5 wnl, Mg 2.9(H) Wbc- 15.9(H) Cbgs- 108, 145, 126 x 48 hrs  Patient is currently intubated on ventilator support MV: 10.0 L/min Temp (24hrs), Avg:98.6 F (37 C), Min:96.1 F (35.6 C), Max:99.9 F (37.7 C)  Propofol: none   MAP- >56mmHg   UOP-   Diet Order:   Diet Order             Diet NPO time specified  Diet effective now                  EDUCATION NEEDS:   No education needs have been identified at this time  Skin:  Skin  Assessment: Reviewed RN Assessment  Last BM:  1/23- type 7  Height:   Ht Readings from Last 1 Encounters:  03/25/21 5' 10.98" (1.803 m)    Weight:   Wt Readings from Last 1 Encounters:  03/27/21 108.2 kg    Ideal Body Weight:  78 kg  BMI:  Body mass index is 33.28 kg/m.  Estimated Nutritional Needs:   Kcal:  1206-1535kcal/day  Protein:  >156g/day  Fluid:  2.3-2.6L/day  Betsey Holiday MS, RD, LDN Please refer to [facility] for RD and/or RD on-call/weekend/after hours pager

## 2021-03-27 NOTE — Progress Notes (Addendum)
Subjective: Received Dilaudid prior to examination.   Objective: Current vital signs: BP (!) 188/140    Pulse (!) 51    Temp 99.5 F (37.5 C)    Resp 20    Ht 5' 10.98" (1.803 m)    Wt 108.2 kg    SpO2 90%    BMI 33.28 kg/m  Vital signs in last 24 hours: Temp:  [96.1 F (35.6 C)-99.9 F (37.7 C)] 99.5 F (37.5 C) (01/23 1100) Pulse Rate:  [48-102] 51 (01/23 1100) Resp:  [7-23] 20 (01/23 1100) BP: (120-197)/(73-140) 188/140 (01/23 1100) SpO2:  [90 %-100 %] 90 % (01/23 1125) FiO2 (%):  [30 %] 30 % (01/23 1125) Weight:  [108.2 kg] 108.2 kg (01/23 0500)  Intake/Output from previous day: 01/22 0701 - 01/23 0700 In: 1840.1 [NG/GT:1460; IV Piggyback:350.1] Out: 2750 [Urine:2600; Stool:150] Intake/Output this shift: Total I/O In: 220 [NG/GT:120; IV Piggyback:100] Out: 450 [Urine:450] Nutritional status:  Diet Order             Diet NPO time specified  Diet effective now                  HEENT: [place]/AT Lungs: Intubated Ext: Warm and well perfused  Neurologic Exam: (performed after patient received a dose of Dilaudid) Ment: No response to loud clapping or calling of his name CN: Pupils are pinpoint (recently received Dilaudid). No blink to threat. Eyes exotropic. Weak doll's eye reflex. Weak corneal reflexes. Face flaccidly symmetric. Intact cough and gag reflexes.  Motor/Sensory: BUE with flaccid tone and no movement to noxious.  BLE with flaccid tone. Trace internal rotation of BLE to noxious, otherwise no movement.  Reflexes: Hypoactive x 4 Cerebellar/Gait: Unable to test.    Lab Results: Results for orders placed or performed during the hospital encounter of 03/23/21 (from the past 48 hour(s))  Culture, Respiratory w Gram Stain     Status: None (Preliminary result)   Collection Time: 03/25/21  3:37 PM   Specimen: Tracheal Aspirate; Respiratory  Result Value Ref Range   Specimen Description      TRACHEAL ASPIRATE Performed at [facility], [address] [place], [place] [address]    Special Requests      NONE Performed at [place], [place] [address]    Gram Stain      MODERATE WBC PRESENT,BOTH PMN AND MONONUCLEAR MODERATE GRAM POSITIVE RODS FEW GRAM POSITIVE COCCI RARE GRAM NEGATIVE RODS    Culture      TOO YOUNG TO READ Performed at [facility], [place] [address] [place], [place] [address]    Report Status PENDING   Glucose, capillary     Status: Abnormal   Collection Time: 03/25/21  4:00 PM  Result Value Ref Range   Glucose-Capillary 118 (H) 70 - 99 mg/dL    Comment: Glucose reference range applies only to samples taken after fasting for at least 8 hours.  CULTURE, BLOOD (ROUTINE X 2) w Reflex to ID Panel     Status: None (Preliminary result)   Collection Time: 03/25/21  5:24 PM   Specimen: BLOOD  Result Value Ref Range   Specimen Description BLOOD BLOOD RIGHT HAND    Special Requests      BOTTLES DRAWN AEROBIC AND ANAEROBIC Blood Culture adequate volume   Culture      NO GROWTH 2 DAYS Performed at [facility], [address] [place], [place] [address]    Report Status PENDING  Urinalysis, Complete w Microscopic     Status: Abnormal   Collection Time: 03/25/21  5:25 PM  Result Value Ref Range   Color, Urine YELLOW (A) YELLOW   APPearance HAZY (A) CLEAR   Specific Gravity, Urine 1.020 1.005 - 1.030   pH 5.0 5.0 - 8.0   Glucose, UA NEGATIVE NEGATIVE mg/dL   Hgb urine dipstick SMALL (A) NEGATIVE   Bilirubin Urine NEGATIVE NEGATIVE   Ketones, ur NEGATIVE NEGATIVE mg/dL   Protein, ur 30 (A) NEGATIVE mg/dL   Nitrite NEGATIVE NEGATIVE   Leukocytes,Ua NEGATIVE NEGATIVE   RBC / HPF 0-5 0 - 5 RBC/hpf   WBC, UA 0-5 0 - 5 WBC/hpf   Bacteria, UA RARE (A) NONE SEEN   Squamous Epithelial / LPF NONE SEEN 0 - 5   Mucus PRESENT    Amorphous Crystal PRESENT     Comment: Performed at [facility], [address] [place], [place] [address]  CULTURE, BLOOD  (ROUTINE X 2) w Reflex to ID Panel     Status: None (Preliminary result)   Collection Time: 03/25/21  5:32 PM   Specimen: BLOOD  Result Value Ref Range   Specimen Description BLOOD BLOOD LEFT HAND    Special Requests      BOTTLES DRAWN AEROBIC AND ANAEROBIC Blood Culture adequate volume   Culture      NO GROWTH 2 DAYS Performed at [facility], [address] [place], [place] [address]    Report Status PENDING   Glucose, capillary     Status: Abnormal   Collection Time: 03/25/21  9:01 PM  Result Value Ref Range   Glucose-Capillary 139 (H) 70 - 99 mg/dL    Comment: Glucose reference range applies only to samples taken after fasting for at least 8 hours.  Glucose, capillary     Status: Abnormal   Collection Time: 03/26/21 12:20 AM  Result Value Ref Range   Glucose-Capillary 148 (H) 70 - 99 mg/dL    Comment: Glucose reference range applies only to samples taken after fasting for at least 8 hours.  Glucose, capillary     Status: Abnormal   Collection Time: 03/26/21  4:00 AM  Result Value Ref Range   Glucose-Capillary 167 (H) 70 - 99 mg/dL    Comment: Glucose reference range applies only to samples taken after fasting for at least 8 hours.  CBC     Status: Abnormal   Collection Time: 03/26/21  5:34 AM  Result Value Ref Range   WBC 21.2 (H) 4.0 - 10.5 K/uL   RBC 4.49 4.22 - 5.81 MIL/uL   Hemoglobin 13.5 13.0 - 17.0 g/dL   HCT 41.6 39.0 - 52.0 %   MCV 92.7 80.0 - 100.0 fL   MCH 30.1 26.0 - 34.0 pg   MCHC 32.5 30.0 - 36.0 g/dL   RDW 14.2 11.5 - 15.5 %   Platelets 199 150 - 400 K/uL   nRBC 0.0 0.0 - 0.2 %    Comment: Performed at [facility], [address] [place], [place] [address]  Comprehensive metabolic panel     Status: Abnormal   Collection Time: 03/26/21  5:34 AM  Result Value Ref Range   Sodium 142 135 - 145 mmol/L   Potassium 3.9 3.5 - 5.1 mmol/L   Chloride 110 98 - 111 mmol/L   CO2 24 22 - 32 mmol/L   Glucose, Bld 154 (H) 70 - 99 mg/dL     Comment: Glucose reference range applies only  to samples taken after fasting for at least 8 hours.   BUN 65 (H) 6 - 20 mg/dL   Creatinine, Ser 1.87 (H) 0.61 - 1.24 mg/dL   Calcium 8.7 (L) 8.9 - 10.3 mg/dL   Total Protein 6.8 6.5 - 8.1 g/dL   Albumin 3.0 (L) 3.5 - 5.0 g/dL   AST 31 15 - 41 U/L   ALT 44 0 - 44 U/L   Alkaline Phosphatase 40 38 - 126 U/L   Total Bilirubin 0.8 0.3 - 1.2 mg/dL   GFR, Estimated 43 (L) >60 mL/min    Comment: (NOTE) Calculated using the CKD-EPI Creatinine Equation (2021)    Anion gap 8 5 - 15    Comment: Performed at [facility], [address] [place], [place] [address]  Glucose, capillary     Status: Abnormal   Collection Time: 03/26/21  7:25 AM  Result Value Ref Range   Glucose-Capillary 131 (H) 70 - 99 mg/dL    Comment: Glucose reference range applies only to samples taken after fasting for at least 8 hours.  Glucose, capillary     Status: Abnormal   Collection Time: 03/26/21 11:18 AM  Result Value Ref Range   Glucose-Capillary 126 (H) 70 - 99 mg/dL    Comment: Glucose reference range applies only to samples taken after fasting for at least 8 hours.  Glucose, capillary     Status: Abnormal   Collection Time: 03/26/21  3:54 PM  Result Value Ref Range   Glucose-Capillary 145 (H) 70 - 99 mg/dL    Comment: Glucose reference range applies only to samples taken after fasting for at least 8 hours.  CBC     Status: Abnormal   Collection Time: 03/27/21  3:21 AM  Result Value Ref Range   WBC 15.9 (H) 4.0 - 10.5 K/uL   RBC 4.26 4.22 - 5.81 MIL/uL   Hemoglobin 12.6 (L) 13.0 - 17.0 g/dL   HCT 39.7 39.0 - 52.0 %   MCV 93.2 80.0 - 100.0 fL   MCH 29.6 26.0 - 34.0 pg   MCHC 31.7 30.0 - 36.0 g/dL   RDW 14.5 11.5 - 15.5 %   Platelets 214 150 - 400 K/uL   nRBC 0.0 0.0 - 0.2 %    Comment: Performed at [facility], [address], [place], [place] [address]  Basic metabolic panel     Status: Abnormal   Collection Time: 03/27/21  3:21 AM   Result Value Ref Range   Sodium 148 (H) 135 - 145 mmol/L   Potassium 4.0 3.5 - 5.1 mmol/L   Chloride 115 (H) 98 - 111 mmol/L   CO2 25 22 - 32 mmol/L   Glucose, Bld 129 (H) 70 - 99 mg/dL    Comment: Glucose reference range applies only to samples taken after fasting for at least 8 hours.   BUN 74 (H) 6 - 20 mg/dL   Creatinine, Ser 1.74 (H) 0.61 - 1.24 mg/dL   Calcium 8.8 (L) 8.9 - 10.3 mg/dL   GFR, Estimated 47 (L) >60 mL/min    Comment: (NOTE) Calculated using the CKD-EPI Creatinine Equation (2021)    Anion gap 8 5 - 15    Comment: Performed at [facility], [address] [place], [place] [address]  Magnesium     Status: Abnormal   Collection Time: 03/27/21  3:21 AM  Result Value Ref Range   Magnesium 2.9 (H) 1.7 - 2.4 mg/dL    Comment: Performed at [facility], [address] [place], [place] [address]  Phosphorus     Status: None   Collection Time: 03/27/21  3:21 AM  Result Value Ref Range   Phosphorus 4.5 2.5 - 4.6 mg/dL    Comment: Performed at [facility], [address] [place], [place] [address]  Glucose, capillary     Status: Abnormal   Collection Time: 03/27/21 11:43 AM  Result Value Ref Range   Glucose-Capillary 108 (H) 70 - 99 mg/dL    Comment: Glucose reference range applies only to samples taken after fasting for at least 8 hours.    Recent Results (from the past 240 hour(s))  Resp Panel by RT-PCR (Flu A&B, Covid) Nasopharyngeal Swab     Status: None   Collection Time: 03/23/21  2:54 AM   Specimen: Nasopharyngeal Swab; Nasopharyngeal(NP) swabs in vial transport medium  Result Value Ref Range Status   SARS Coronavirus 2 by RT PCR NEGATIVE NEGATIVE Final    Comment: (NOTE) SARS-CoV-2 target nucleic acids are NOT DETECTED.  The SARS-CoV-2 RNA is generally detectable in upper respiratory specimens during the acute phase of infection. The lowest concentration of SARS-CoV-2 viral copies this assay can detect is 138 copies/mL. A  negative result does not preclude SARS-Cov-2 infection and should not be used as the sole basis for treatment or other patient management decisions. A negative result may occur with  improper specimen collection/handling, submission of specimen other than nasopharyngeal swab, presence of viral mutation(s) within the areas targeted by this assay, and inadequate number of viral copies(<138 copies/mL). A negative result must be combined with clinical observations, patient history, and epidemiological information. The expected result is Negative.  Fact Sheet for Patients:  EntrepreneurPulse.com.au  Fact Sheet for Healthcare Providers:  IncredibleEmployment.be  This test is no t yet approved or cleared by the [place] FDA and  has been authorized for detection and/or diagnosis of SARS-CoV-2 by FDA under an Emergency Use Authorization (EUA). This EUA will remain  in effect (meaning this test can be used) for the duration of the COVID-19 declaration under Section 564(b)(1) of the Act, 21 U.S.C.section 360bbb-3(b)(1), unless the authorization is terminated  or revoked sooner.       Influenza A by PCR NEGATIVE NEGATIVE Final   Influenza B by PCR NEGATIVE NEGATIVE Final    Comment: (NOTE) The Xpert Xpress SARS-CoV-2/FLU/RSV plus assay is intended as an aid in the diagnosis of influenza from Nasopharyngeal swab specimens and should not be used as a sole basis for treatment. Nasal washings and aspirates are unacceptable for Xpert Xpress SARS-CoV-2/FLU/RSV testing.  Fact Sheet for Patients: EntrepreneurPulse.com.au  Fact Sheet for Healthcare Providers: IncredibleEmployment.be  This test is not yet approved or cleared by the [place] FDA and has been authorized for detection and/or diagnosis of SARS-CoV-2 by FDA under an Emergency Use Authorization (EUA). This EUA will remain in effect (meaning this test can  be used) for the duration of the COVID-19 declaration under Section 564(b)(1) of the Act, 21 U.S.C. section 360bbb-3(b)(1), unless the authorization is terminated or revoked.  Performed at [facility], [address] [place], [place] [address]   MRSA Next Gen by PCR, Nasal     Status: None   Collection Time: 03/23/21  4:20 AM   Specimen: Nasal Mucosa; Nasal Swab  Result Value Ref Range Status   MRSA by PCR Next Gen NOT DETECTED NOT DETECTED Final    Comment: (NOTE) The GeneXpert MRSA Assay (FDA approved for NASAL specimens only),  is one component of a comprehensive MRSA colonization surveillance program. It is not intended to diagnose MRSA infection nor to guide or monitor treatment for MRSA infections. Test performance is not FDA approved in patients less than 64 years old. Performed at [facility], [address] [place], [place] [address]   Culture, Respiratory w Gram Stain     Status: None (Preliminary result)   Collection Time: 03/25/21  3:37 PM   Specimen: Tracheal Aspirate; Respiratory  Result Value Ref Range Status   Specimen Description   Final    TRACHEAL ASPIRATE Performed at [facility], [address] [place], [place] [address]    Special Requests   Final    NONE Performed at [address] [place], [place] [address]    Gram Stain   Final    MODERATE WBC PRESENT,BOTH PMN AND MONONUCLEAR MODERATE GRAM POSITIVE RODS FEW GRAM POSITIVE COCCI RARE GRAM NEGATIVE RODS    Culture   Final    TOO YOUNG TO READ Performed at [facility], [place] [address], [place], [place] [address]    Report Status PENDING  Incomplete  CULTURE, BLOOD (ROUTINE X 2) w Reflex to ID Panel     Status: None (Preliminary result)   Collection Time: 03/25/21  5:24 PM   Specimen: BLOOD  Result Value Ref Range Status   Specimen Description BLOOD BLOOD RIGHT HAND  Final   Special Requests   Final    BOTTLES DRAWN AEROBIC AND  ANAEROBIC Blood Culture adequate volume   Culture   Final    NO GROWTH 2 DAYS Performed at [facility], [address] [place], [place] [address]    Report Status PENDING  Incomplete  CULTURE, BLOOD (ROUTINE X 2) w Reflex to ID Panel     Status: None (Preliminary result)   Collection Time: 03/25/21  5:32 PM   Specimen: BLOOD  Result Value Ref Range Status   Specimen Description BLOOD BLOOD LEFT HAND  Final   Special Requests   Final    BOTTLES DRAWN AEROBIC AND ANAEROBIC Blood Culture adequate volume   Culture   Final    NO GROWTH 2 DAYS Performed at [facility], [address], [place], [place] [address]    Report Status PENDING  Incomplete    Lipid Panel No results for input(s): CHOL, TRIG, HDL, CHOLHDL, VLDL, LDLCALC in the last 72 hours.  Studies/Results: DG Chest Port 1 View  Result Date: 03/26/2021 CLINICAL DATA:  Hypoxia. EXAM: PORTABLE CHEST 1 VIEW COMPARISON:  Chest XR, 03/25/2021. FINDINGS: Support lines: ETT within the midthoracic trachea, 7 cm from carina. Esophageal temperature probe. Enteric feeding tube with tip and side port within stomach. Overlying cardiac leads. Cardiac silhouette is within normal limits given technique and degree of inflation. Hypoinflation. Bilateral perihilar and basilar streaky interstitial opacities. No pleural effusion or pneumothorax. No interval osseous abnormality. IMPRESSION: 1. Well-positioned ET tube, esophageal temperature probe and enteric feeding tube. 2. Hypoinflation with minimal bilateral atelectasis. Electronically Signed   By: Michaelle Birks M.D.   On: 03/26/2021 09:38    Medications: Scheduled:  carvedilol  25 mg Per Tube BID WC   chlorhexidine gluconate (MEDLINE KIT)  15 mL Mouth Rinse BID   Chlorhexidine Gluconate Cloth  6 each Topical Q0600   docusate  100 mg Per Tube BID   enoxaparin (LOVENOX) injection  0.5 mg/kg Subcutaneous Q24H   feeding supplement (PROSource TF)  90 mL Per Tube TID  feeding  supplement (VITAL HIGH PROTEIN)  1,000 mL Per Tube Q24H   free water  100 mL Per Tube Q6H   hydrALAZINE  25 mg Per Tube Q8H   insulin aspart  0-15 Units Subcutaneous Q4H   isosorbide dinitrate  10 mg Per Tube TID   mouth rinse  15 mL Mouth Rinse 10 times per day   multivitamin  15 mL Per Tube Daily   pantoprazole sodium  40 mg Per Tube Daily   polyethylene glycol  17 g Per Tube Daily   sodium chloride flush  3 mL Intravenous Q12H   Continuous:  sodium chloride     sodium chloride Stopped (03/23/21 0445)   cefTRIAXone (ROCEPHIN)  IV Stopped (03/26/21 1701)   levETIRAcetam Stopped (03/27/21 1050)    Assessment: 51 year old male with history of hypertension who is admitted after cardiac arrest 03/22/21.  Etiology of arrest is unclear, given insignificant CAD on cath. Cardiology suspects this was 2/2 primary VF event vs possible angioedema 2/2 his lisinopril. He now does have severely depressed EF (not present on left ventriculography during cath) and has now completed heparin gtt for NSTEMI. He has intact brainstem reflexes on exam and does flutter eyes in response to voice but does not follow commands or have any volitional motor response in his extremities to pain. MRI brain wo contrast does show e/o anoxic brain injury.  - Continues to not have any twitching on repeat neurological exam today. However, recommend continuation of Keppra for now. Of note, received Dilaudid shortly before today's exam. 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 - EEG 1/19 did not show any epileptiform abnl. - Repeat EEG this AM: This study showed evidence of epileptogenicity with generalized onset. There is also moderate diffuse encephalopathy, nonspecific to etiology. No seizures were seen throughout the recording - Formal prognostication exam has been delayed 2/2 patient requiring sedation within past  24 hours and new infection past 48 hrs (VAP).   Recommendations: - Continue Keppra 500 mg bid - Plan for repeat neurologic exam after patient has been off sedation for > 72 hrs  35 minutes spent in the neurological evaluation and management of this critically ill patient    LOS: 4 days   _0  signed: Dr. Kerney Elbe 03/27/2021  12:58 PM

## 2021-03-27 NOTE — Progress Notes (Signed)
[facility] Cardiology  CARDIOLOGY CONSULT NOTE  Patient ID: Billy Cherry MRN: [id number] DOB/AGE: [birth year] 51 y.o.  Admit date: 03/23/2021 Referring Physician [facility] Primary Physician Mena Goes [facility] family med (last seen 07/2019) Primary Cardiologist  Reason for Consultation cardiopulmonary arrest  HPI: 51 year old gentleman with a PMH significant for hypertension and hyperlipidemia referred for cardiopulmonary arrest with suspicion for ST elevation myocardial infarction.  Patient has a history of essential hypertension.  Patient was sleeping, found by his wife agonal respirations, called EMS, and performed bystander CPR.  On EMS arrival patient was found to be in ventricular fibrillation and defibrillated x1, converted to asystole given 2 rounds of epinephrine.  Post defibrillation ECG revealed sinus tachycardia with right bundle branch block and apparent ST elevation in anterolateral leads.  Following arrival at [facility] ED, patient was intubated.  ECG in the ED revealed minus rhythm 85 bpm with nonspecific ST-T abnormalities the anterolateral leads.  The patient was brought to the cardiac catheterization laboratory coronary angiography revealed insignificant coronary artery disease.  Echocardiogram revealed LVEF less than 20% with global hypokinesis.  Further history was provided per the patient's wife and the inciting event was thought to be angioedema causing hypoxia and subsequent events secondary to ACE/ARB use.  Interval history: - BP labile, added Imdur this morning. -propofol weaned off for formal neuro exam -no acute events  Review of systems unable to be assessed due to patient's sedation.    Past Medical History:  Diagnosis Date   Hypertension     Past Surgical History:  Procedure Laterality Date   BACK SURGERY     CORONARY/GRAFT ACUTE MI REVASCULARIZATION N/A 03/23/2021   Procedure: Coronary/Graft Acute MI Revascularization;  Surgeon: Isaias Cowman, MD;  Location: [place] [facility];  Service: Cardiovascular;  Laterality: N/A;   LEFT HEART CATH AND CORONARY ANGIOGRAPHY N/A 03/23/2021   Procedure: LEFT HEART CATH AND CORONARY ANGIOGRAPHY;  Surgeon: Isaias Cowman, MD;  Location: [place] [facility];  Service: Cardiovascular;  Laterality: N/A;   SHOULDER SURGERY      Medications Prior to Admission  Medication Sig Dispense Refill Last Dose   lisinopril (PRINIVIL,ZESTRIL) 10 MG tablet Take 10 mg by mouth daily.    03/22/2021   sildenafil (VIAGRA) 25 MG tablet Take by mouth as directed.   Past Month   lisinopril (ZESTRIL) 10 MG tablet Take 1 tablet (10 mg total) by mouth daily. 30 tablet 1     Social History   Socioeconomic History   Marital status: Married    Spouse name: Not on file   Number of children: Not on file   Years of education: Not on file   Highest education level: Not on file  Occupational History   Not on file  Tobacco Use   Smoking status: Never   Smokeless tobacco: Never  Substance and Sexual Activity   Alcohol use: Yes   Drug use: Never   Sexual activity: Not on file  Other Topics Concern   Not on file  Social History Narrative   Not on file   Social Determinants of Health   Financial Resource Strain: Not on file  Food Insecurity: Not on file  Transportation Needs: Not on file  Physical Activity: Not on file  Stress: Not on file  Social Connections: Not on file  Intimate Partner Violence: Not on file    No family history on file.   Review of systems complete and found to be negative unless listed above   PHYSICAL EXAM General: Well developed  Black male, intubated. Wife at bedside.  HEENT:  Normocephalic and atraumatic.  Neck:  No JVD.  Lungs: intubated, coarse breath sounds anteriorly Heart: HRRR . Normal S1 and S2 without gallops or murmurs.  Abdomen: Nondistended appearing Msk: Unable to assess Extremities: No clubbing, cyanosis or edema.   Neuro: some spontaneous blinking and shaking his head but no  other spontaneous motor function.  Psych: Unable to assess  Labs:   Lab Results  Component Value Date   WBC 15.9 (H) 03/27/2021   HGB 12.6 (L) 03/27/2021   HCT 39.7 03/27/2021   MCV 93.2 03/27/2021   PLT 214 03/27/2021    Recent Labs  Lab 03/26/21 0534 03/27/21 0321  NA 142 148*  K 3.9 4.0  CL 110 115*  CO2 24 25  BUN 65* 74*  CREATININE 1.87* 1.74*  CALCIUM 8.7* 8.8*  PROT 6.8  --   BILITOT 0.8  --   ALKPHOS 40  --   ALT 44  --   AST 31  --   GLUCOSE 154* 129*    No results found for: CKTOTAL, CKMB, CKMBINDEX, TROPONINI  Lab Results  Component Value Date   CHOL 254 (H) 03/23/2021   Lab Results  Component Value Date   HDL 52 03/23/2021   Lab Results  Component Value Date   LDLCALC 174 (H) 03/23/2021   Lab Results  Component Value Date   TRIG 142 03/24/2021   TRIG 138 03/23/2021   Lab Results  Component Value Date   CHOLHDL 4.9 03/23/2021   No results found for: LDLDIRECT    Radiology: DG Chest 1 View  Result Date: 03/23/2021 CLINICAL DATA:  51 year old male status post cardiopulmonary arrest, out of hospital CPR. Altered mental status. EXAM: CHEST  1 VIEW COMPARISON:  Portable chest 0308 hours. FINDINGS: Portable AP upright view at 0458 hours. Endotracheal tube tip in good position between the clavicles and carina. Enteric tube terminates in the left upper quadrant. Pacer pads over the left lower chest. Normal cardiac size and mediastinal contours. Allowing for portable technique the lungs are clear. Visible bowel-gas within normal limits. No displaced rib fracture. IMPRESSION: 1. Satisfactory ET tube and enteric tube. 2.  No acute cardiopulmonary abnormality. Electronically Signed   By: Genevie Ann M.D.   On: 03/23/2021 06:05   DG Abd 1 View  Result Date: 03/23/2021 CLINICAL DATA:  Evaluate OG tube placement. EXAM: ABDOMEN - 1 VIEW COMPARISON:  None. FINDINGS: The OG tube courses below the level of the GE junction. The tip of the OG tube is in the expected  location of the gastric fundus with side port well below the GE junction. Single borderline dilated loop of small bowel noted in the left lower quadrant of the abdomen with normal caliber colon. IMPRESSION: OG tube tip is in the gastric fundus with side port well below the GE junction. Electronically Signed   By: Kerby Moors M.D.   On: 03/23/2021 05:16   CT HEAD WO CONTRAST (5MM)  Result Date: 03/23/2021 CLINICAL DATA:  51 year old male status post cardiopulmonary arrest, out of hospital CPR. Altered mental status. EXAM: CT HEAD WITHOUT CONTRAST TECHNIQUE: Contiguous axial images were obtained from the base of the skull through the vertex without intravenous contrast. RADIATION DOSE REDUCTION: This exam was performed according to the departmental dose-optimization program which includes automated exposure control, adjustment of the mA and/or kV according to patient size and/or use of iterative reconstruction technique. COMPARISON:  None. FINDINGS: Brain: Small round hypodensity at the  genu of the left internal capsule on series 2, image 15. Elsewhere gray-white matter differentiation is preserved and within normal limits. However, there is questionable generalized loss of cerebral sulci. But basilar cisterns appear normal. Normal cerebral volume. No midline shift, ventriculomegaly, mass effect, evidence of mass lesion, intracranial hemorrhage or evidence of cortically based acute infarction. Vascular: Mild generalized intracranial vascular hyperdensity, perhaps related to hemoconcentration. No suspicious intracranial vascular hyperdensity. Skull: No acute osseous abnormality identified. Sinuses/Orbits: Intubated on the scout view. Fluid in the visible pharynx. Right mastoid effusion. Right tympanic cavity remains clear. Mild left mastoid effusion. Paranasal sinuses remain well aerated. Other: No acute orbit or scalp soft tissue finding. IMPRESSION: 1. Difficult without a prior study to exclude the possibility  of anoxic injury; questionable loss of cerebral sulci throughout the brain, but the gray-white matter differentiation is preserved. If decreased mental status persists then repeat Head CT in 12-24 hours or noncontrast brain MRI would be valuable. 2. 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 No intracranial hemorrhage or mass effect. 3. Intubated, with mastoid air cell effusions. Electronically Signed   By: Genevie Ann M.D.   On: 03/23/2021 06:04   MR BRAIN WO CONTRAST  Result Date: 03/24/2021 CLINICAL DATA:  Anoxic brain damage. EXAM: MRI HEAD WITHOUT CONTRAST TECHNIQUE: Multiplanar, multiecho pulse sequences of the brain and surrounding structures were obtained without intravenous contrast. COMPARISON:  Head CT March 23, 2021 FINDINGS: Brain: Cortical restricted diffusion along the bilateral ACA-MCA watershed, bilateral occipital lobes, posterior cingulate, bilateral caudate and lentiform nucleus, particularly posterior aspect of the putamen, consistent with hypoxic ischemic injury. Questionable restricted diffusion in the tail of left hippocampus. Punctate foci of restricted diffusion are also seen in the left cerebellar hemisphere, right posterior temporal lobe, medial right frontal lobe and left occipital lobe. Posterior fossa structures are otherwise maintained. No significant mass effect, hydrocephalus, hemorrhage, extra-axial collection or mass lesion. Remote lacunar infarct at the genu of the left internal capsule. A few scattered foci of T2 hyperintensity within the white matter of the cerebral hemispheres and periventricular white matter of the left temporal lobe, nonspecific. Vascular: Normal flow voids. Skull and upper cervical spine: No focal marrow lesion. Sinuses/Orbits: Fluid level within the right maxillary sinus and bilateral mastoid effusion may be related to endotracheal tube. The orbits are maintained. Other: None. IMPRESSION: Findings consistent with hypoxic ischemic injury  involving the bilateral ACA-MCA watershed cortex, bilateral occipital cortex and basal ganglia. Electronically Signed   By: Pedro Earls M.D.   On: 03/24/2021 13:53   CARDIAC CATHETERIZATION  Result Date: 03/23/2021   Dist RCA lesion is 20% stenosed.   There is mild left ventricular systolic dysfunction.   The left ventricular ejection fraction is 45-50% by visual estimate. 1.  Insignificant coronary artery disease 2.  Mild reduced left ventricular function with estimated LV ejection fraction 45 to 50% Recommendations 1.  Admit to ICU for ventilator management 2.  2D echocardiogram   DG Chest Port 1 View  Result Date: 03/26/2021 CLINICAL DATA:  Hypoxia. EXAM: PORTABLE CHEST 1 VIEW COMPARISON:  Chest XR, 03/25/2021. FINDINGS: Support lines: ETT within the midthoracic trachea, 7 cm from carina. Esophageal temperature probe. Enteric feeding tube with tip and side port within stomach. Overlying cardiac leads. Cardiac silhouette is within normal limits given technique and degree of inflation. Hypoinflation. Bilateral perihilar and basilar streaky interstitial opacities. No pleural effusion or pneumothorax. No interval osseous abnormality. IMPRESSION: 1. Well-positioned ET tube, esophageal temperature probe and enteric feeding tube. 2.  Hypoinflation with minimal bilateral atelectasis. Electronically Signed   By: Michaelle Birks M.D.   On: 03/26/2021 09:38   DG Chest Port 1 View  Result Date: 03/25/2021 CLINICAL DATA:  Acute respiratory failure with hypoxia. EXAM: PORTABLE CHEST 1 VIEW COMPARISON:  03/24/2021 FINDINGS: ET tube tip is above the carina. There is a NG tube with tip and side port below the GE junction. Temperature probe is noted with tip terminating in the midesophagus. Heart size normal. No pleural effusion or edema. Lung volumes are low. No airspace opacities. IMPRESSION: 1. Support apparatus positioned as above. 2. No acute cardiopulmonary abnormalities noted. Electronically Signed    By: Kerby Moors M.D.   On: 03/25/2021 08:04   DG Chest Port 1 View  Result Date: 03/24/2021 CLINICAL DATA:  Acute respiratory failure, hypoxia EXAM: PORTABLE CHEST 1 VIEW COMPARISON:  Chest radiograph 1 day prior FINDINGS: The endotracheal tube tip is approximately 3.1 cm from the carina. The enteric catheter is coiled in the stomach. A presumed esophageal temperature probe terminates in the mid thorax. The cardiomediastinal silhouette is stable. Lung aeration is unchanged. There is no new or worsening focal airspace disease. There is no pleural effusion or pneumothorax. There is a mildly displaced fracture of the left fifth rib possibly related to CPR. IMPRESSION: Mildly displaced fracture of the left fifth rib which may be related to CPR. Otherwise, stable chest with no radiographic evidence of acute cardiopulmonary process. Electronically Signed   By: Valetta Mole M.D.   On: 03/24/2021 07:21   DG Chest Portable 1 View  Result Date: 03/23/2021 CLINICAL DATA:  Status post intubation. EXAM: PORTABLE CHEST 1 VIEW COMPARISON:  None. FINDINGS: Overlying radiopaque cardiac lead wires and tags are seen. An endotracheal tube is seen with its distal tip approximately 0.9 cm from the carina. Mildly decreased lung volumes are noted. Mild atelectasis and/or infiltrate is seen along the infrahilar region of the right lung base. There is no evidence of a pleural effusion or pneumothorax. The heart size and mediastinal contours are within normal limits. The visualized skeletal structures are unremarkable. IMPRESSION: 1. Endotracheal tube with its distal tip approximately 0.9 cm from the carina. 2. Mild right basilar atelectasis and/or infiltrate. Electronically Signed   By: Virgina Norfolk M.D.   On: 03/23/2021 03:30   EEG adult  Result Date: 03/23/2021 Derek Jack, MD     03/23/2021  7:35 PM Routine EEG Report Joseff Forseth is a 51 y.o. male with a history of cardiac arrest who is undergoing an EEG to  evaluate for seizures. Report: This EEG was acquired with electrodes placed according to the International 10-20 electrode system (including Fp1, Fp2, F3, F4, C3, C4, P3, P4, O1, O2, T3, T4, T5, T6, A1, A2, Fz, Cz, Pz). The following electrodes were missing or displaced: none. There was no clear occipital dominant rhythm. The best background was low amplitude theta which was reactive to stimulation. At the beginning of the recording propofol had just been paused and there were intervening periods of diffuse suppression. Towards the end of the recording as propofol wore off background became near continuous. There was no focal slowing. There was no sleep architecture observed. There were no interictal epileptiform discharges. There were no electrographic seizures identified. Photic stimulation and hyperventilation were not performed. Impression and clinical correlation: This EEG was obtained while comatose and is abnormal due to: - Moderate diffuse slowing indicative of global cerebral dysfunction - Initial burst suppression pattern which abated as propofol wore off and  is favored to be largely (or entirely) secondary to medication effect Su Monks, MD [facility] [phone] If 7pm- 7am, please page neurology on call as listed in [place].   ECHOCARDIOGRAM COMPLETE  Result Date: 03/23/2021    ECHOCARDIOGRAM REPORT   Patient Name:   NYCERE RENY Date of Exam: 03/23/2021 Medical Rec #:  [id number]       Height:       71.0 in Accession #:    [id number]      Weight:       241.0 lb Date of Birth:  Mar 14, 1970       BSA:          2.283 m Patient Age:    51 years        BP:           127/96 mmHg Patient Gender: M               HR:           105 bpm. Exam Location:  [facility] Procedure: 2D Echo, Color Doppler and Cardiac Doppler Indications:     I46.9 Cardiac arrest  History:         Patient has no prior history of Echocardiogram examinations.                  Risk Factors:Hypertension.  Sonographer:     Charmayne Sheer Referring Phys:  [place] Diagnosing Phys: Serafina Royals MD  Sonographer Comments: Echo performed with patient supine and on artificial respirator. IMPRESSIONS  1. Left ventricular ejection fraction, by estimation, is <20%. The left ventricle has severely decreased function. The left ventricle demonstrates global hypokinesis. The left ventricular internal cavity size was mildly dilated. There is mild left ventricular hypertrophy. Left ventricular diastolic parameters are consistent with Grade II diastolic dysfunction (pseudonormalization).  2. Right ventricular systolic function is normal. The right ventricular size is normal.  3. The mitral valve is normal in structure. Trivial mitral valve regurgitation.  4. The aortic valve is normal in structure. Aortic valve regurgitation is not visualized. FINDINGS  Left Ventricle: Left ventricular ejection fraction, by estimation, is <20%. The left ventricle has severely decreased function. The left ventricle demonstrates global hypokinesis. The left ventricular internal cavity size was mildly dilated. There is mild left ventricular hypertrophy. Left ventricular diastolic parameters are consistent with Grade II diastolic dysfunction (pseudonormalization). Right Ventricle: The right ventricular size is normal. No increase in right ventricular wall thickness. Right ventricular systolic function is normal. Left Atrium: Left atrial size was normal in size. Right Atrium: Right atrial size was normal in size. Pericardium: There is no evidence of pericardial effusion. Mitral Valve: The mitral valve is normal in structure. Trivial mitral valve regurgitation. MV peak gradient, 3.5 mmHg. The mean mitral valve gradient is 2.0 mmHg. Tricuspid Valve: The tricuspid valve is normal in structure. Tricuspid valve regurgitation is trivial. Aortic Valve: The aortic valve is normal in structure. Aortic valve regurgitation is not visualized. Aortic valve mean gradient  measures 2.0 mmHg. Aortic valve peak gradient measures 3.7 mmHg. Aortic valve area, by VTI measures 3.03 cm. Pulmonic Valve: The pulmonic valve was normal in structure. Pulmonic valve regurgitation is not visualized. Aorta: The aortic root and ascending aorta are structurally normal, with no evidence of dilitation. IAS/Shunts: No atrial level shunt detected by color flow Doppler.  LEFT VENTRICLE PLAX 2D LVIDd:         5.81 cm      Diastology LVIDs:  5.17 cm      LV e' medial:    4.68 cm/s LV PW:         1.52 cm      LV E/e' medial:  7.6 LV IVS:        1.01 cm      LV e' lateral:   3.26 cm/s LVOT diam:     2.30 cm      LV E/e' lateral: 10.9 LV SV:         39 LV SV Index:   17 LVOT Area:     4.15 cm  LV Volumes (MOD) LV vol d, MOD A2C: 219.0 ml LV vol d, MOD A4C: 198.0 ml LV vol s, MOD A2C: 156.0 ml LV vol s, MOD A4C: 153.0 ml LV SV MOD A2C:     63.0 ml LV SV MOD A4C:     198.0 ml LV SV MOD BP:      56.7 ml RIGHT VENTRICLE RV Basal diam:  3.99 cm RV S prime:     15.40 cm/s LEFT ATRIUM             Index        RIGHT ATRIUM           Index LA diam:        4.30 cm 1.88 cm/m   RA Area:     15.20 cm LA Vol (A2C):   55.4 ml 24.27 ml/m  RA Volume:   34.90 ml  15.29 ml/m LA Vol (A4C):   48.2 ml 21.11 ml/m LA Biplane Vol: 52.1 ml 22.82 ml/m  AORTIC VALVE                    PULMONIC VALVE AV Area (Vmax):    2.63 cm     PV Vmax:          0.87 m/s AV Area (Vmean):   2.53 cm     PV Vmean:         63.300 cm/s AV Area (VTI):     3.03 cm     PV VTI:           0.127 m AV Vmax:           96.50 cm/s   PV Peak grad:     3.0 mmHg AV Vmean:          67.100 cm/s  PV Mean grad:     2.0 mmHg AV VTI:            0.127 m      PR End Diast Vel: 6.66 msec AV Peak Grad:      3.7 mmHg AV Mean Grad:      2.0 mmHg LVOT Vmax:         61.10 cm/s LVOT Vmean:        40.800 cm/s LVOT VTI:          0.093 m LVOT/AV VTI ratio: 0.73  AORTA Ao Root diam: 3.30 cm MITRAL VALVE MV Area (PHT): 7.99 cm    SHUNTS MV Area VTI:   2.25 cm    Systemic  VTI:  0.09 m MV Peak grad:  3.5 mmHg    Systemic Diam: 2.30 cm MV Mean grad:  2.0 mmHg MV Vmax:       0.94 m/s MV Vmean:      67.2 cm/s MV Decel Time: 95 msec MV E velocity: 35.60 cm/s MV A velocity: 84.00 cm/s MV E/A ratio:  0.42  Serafina Royals MD Electronically signed by Serafina Royals MD Signature Date/Time: 03/23/2021/1:02:27 PM    Final     EKG: Sinus rhythm 85 bpm with nonspecific ST abnormalities  Telemetry reviewed by me: NSR with PVCs rate 78  Echocardiogram 03/23/2020  1. Left ventricular ejection fraction, by estimation, is <20%. The left  ventricle has severely decreased function. The left ventricle demonstrates  global hypokinesis. The left ventricular internal cavity size was mildly  dilated. There is mild left  ventricular hypertrophy. Left ventricular diastolic parameters are  consistent with Grade II diastolic dysfunction (pseudonormalization).   2. Right ventricular systolic function is normal. The right ventricular  size is normal.   3. The mitral valve is normal in structure. Trivial mitral valve  regurgitation.   4. The aortic valve is normal in structure. Aortic valve regurgitation is  not visualized.   ASSESSMENT AND PLAN:   #Cardiopulmonary arrest #acute HFrEF (LVEF <20%, global hypokinesis) -Further history from the patient's wife revealed the etiology of his presentation could be from lisinopril or losartan induced angioedema causing hypoxia and subsequent events that followed. -S/p Levophed gtt. discontinued on 1/20.  Blood pressure has been labile over the weekend requiring doses of IV labetalol.  Sedation with propofol discontinued 1/22 for further formal neuro exam. -GDMT includes Coreg 25 mg twice daily, hydralazine 25 mg every 8 hours -Adding Imdur 30 once daily with goals to uptitrate -No further cardiac diagnostics necessary at this time, consider repeat echocardiogram later in clinical course if necessary to assess for risk stratification.   -Neurology  consulted by primary team, appreciate their recommendations  #Respiratory failure #VAP -Continues to require ventilatory support, supportive care as per primary team  #Essential hypertension -Do not restart ACE inhibitor or ARB due to suspected angioedema. -Other plan as above  #Acute kidney injury with underlying mild CKD -Cr 2.08 and GFR 38 on admission, improving.   This patient's plan of care was discussed and created with Dr. Serafina Royals and he is in agreement.  Signed: Tristan Schroeder, PA-C 03/27/2021, 10:34 AM

## 2021-03-27 NOTE — Procedures (Signed)
Patient Name: Billy Cherry  MRN: [id number]  Epilepsy Attending: Charlsie Quest  Referring Physician/Provider: Jefferson Fuel, MD Date: 03/27/2021 Duration: 22.26 mins  Patient history: 51 year old gentleman with history of hypertension who is admitted after cardiac arrest. EEG to evaluate for seizure  Level of alertness: lethargic  AEDs during EEG study: LEV  Technical aspects: This EEG study was done with scalp electrodes positioned according to the 10-20 International system of electrode placement. Electrical activity was acquired at a sampling rate of 500Hz  and reviewed with a high frequency filter of 70Hz  and a low frequency filter of 1Hz . EEG data were recorded continuously and digitally stored.   Description: EEG showed near continuous generalized 3 to 5 Hz theta-delta slowing admixed with 1-3 seconds of intermittent eeg attenuation. Generalized spikes were noted intermittently. Hyperventilation and photic stimulation were not performed.     ABNORMALITY  - Spike,generalized - Continuous slow, generalized  IMPRESSION: This study showed evidence of epileptogenicity with generalized onset. There is also moderate diffuse encephalopathy, nonspecific etiology. No seizures were seen throughout the recording.  Billy Cherry 

## 2021-03-27 NOTE — Plan of Care (Signed)

## 2021-03-27 NOTE — Progress Notes (Signed)
GOALS OF CARE DISCUSSION  The Clinical status was relayed to family in detail. Wife at Bedside Updated and notified of patients medical condition.    Patient remains unresponsive and will not open eyes to command.   Patient is having a weak cough and struggling to remove secretions.   Patient with increased WOB and using accessory muscles to breathe Explained to family course of therapy and the modalities    Patient with Progressive and severe brain damage multiorgan failure with a very high probablity of a very minimal chance of meaningful recovery despite all aggressive and optimal medical therapy.  PATIENT REMAINS FULL CODE Patient will likely need TRACH AND PEG TUBE for survival   Family are satisfied with Plan of action and management. All questions answered  Additional CC time 35 mins   Akayla Brass Santiago Glad, M.D.  [facility]  Medical Director [facility] Medical Director [facility] Cardio-Pulmonary Department

## 2021-03-27 NOTE — Consult Note (Signed)
PHARMACY CONSULT NOTE Pharmacy Consult for Electrolyte Monitoring and Replacement   Recent Labs: Potassium (mmol/L)  Date Value  03/27/2021 4.0   Magnesium (mg/dL)  Date Value  09/02/1599 2.9 (H)   Calcium (mg/dL)  Date Value  09/32/3557 8.8 (L)   Albumin (g/dL)  Date Value  32/20/2542 3.0 (L)   Phosphorus (mg/dL)  Date Value  70/62/3762 4.5   Sodium (mmol/L)  Date Value  03/27/2021 148 (H)   Assessment: 51 year old male with hypertension BIB EMS for STEMI found to be in Vfib on arrival. Patient converted to asystole after defibrillation by EMS, but subsequently with ROSC after CPR/epi. He was intubated in the ED. Patient was taken to the cath lab and was found to have no significant CAD and an LVEF <20%. Patient was then transferred to the CCU. Pharmacy has been consulted to monitor and replace electrolytes.   Na: 142>148  Nutrition: tube feeds @40mL /hr + supplements TID, free water q6H  Goal of Therapy:  Potassium ~ 4, magnesium ~ 2, all other electrolytes within normal limits  Plan:  Creatinine now improving (1.87>1.74). Mg, Na elevated. CTM, currently on free water flushes. No replacement indicated today Will continue to monitor with labs tomorrow  , PharmD, [facility] Clinical Pharmacist 03/27/2021 8:31 AM

## 2021-03-27 NOTE — Progress Notes (Signed)
NAME:  Billy Cherry, MRN:  [id number], DOB:  1970/05/09, LOS: 4 ADMISSION DATE:  03/23/2021, CONSULTATION DATE:  1/19 REFERRING MD:  Lorinda Creed, CHIEF COMPLAINT:  Found down, cardiac arrest   Brief Pt Description / Synopsis:  51 year old male with out-of-hospital V. fib cardiac arrest.  Cardiac catheterization without evidence of significant coronary artery disease. Now with concern for anoxic brain injury.  History of Present Illness:  51 y/o male was found down by his wife this evening with agonal respirations.  911 called, received out of hospital CPR, 1 shock with defibrillator, 2 doses of epinephrine.  Had return of spontaneous circulation.  Concern on his 12 lead EKG for anterolateral ST elevation so he was taken to the cath lab where his study showed no significant coronary artery disease and an LVEF 45-50%.   He remained encephalopathic post cardiac arrest so PCCM was consulted.    Family notes he rarely drinks alcohol, doesn't smoke, doesn't use illicit drugs.  Takes blood pressure medicine at home, may have run out recently.   Pertinent  Medical History  Hypertension  Micro Data:  1/19 SARS CoV2/Flu > negative  Antimicrobials:  Ceftriaxone 1/21>>  Significant Hospital Events: Including procedures, antibiotic start and stop dates in addition to other pertinent events   1/19 admission, left heart cath without significant CAD.  Concern for anoxic brain injury 1/20: MRI pending, Neuro following 1/21: MRI shows areas of hypoxic-ischemic injury bilaterally. Fevered; cultures obtained. 1/23 severe brain damage  Interim History / Subjective:  +aspiration pneumonia  +severe brain damage Severe resp failure Remains on vent   Objective   Blood pressure (!) 125/94, pulse 69, temperature (!) 97.5 F (36.4 C), temperature source Esophageal, resp. rate 20, height 5' 10.98" (1.803 m), weight 108.2 kg, SpO2 100 %.    Vent Mode: PRVC FiO2 (%):  [30 %] 30 % Set Rate:  [20 bmp] 20  bmp Vt Set:  [500 mL] 500 mL PEEP:  [5 cmH20] 5 cmH20 Plateau Pressure:  [14 cmH20-16 cmH20] 16 cmH20   Intake/Output Summary (Last 24 hours) at 03/27/2021 0754 Last data filed at 03/27/2021 0600 Gross per 24 hour  Intake 1840.14 ml  Output 2750 ml  Net -909.86 ml    Filed Weights   03/25/21 0500 03/26/21 0500 03/27/21 0500  Weight: 108.3 kg 110.5 kg 108.2 kg    REVIEW OF SYSTEMS  PATIENT IS UNABLE TO PROVIDE COMPLETE REVIEW OF SYSTEMS DUE TO SEVERE CRITICAL ILLNESS AND TOXIC METABOLIC ENCEPHALOPATHY    PHYSICAL EXAMINATION:  GENERAL:critically ill appearing, +resp distress EYES: Pupils equal, round, reactive to light.  No scleral icterus.  MOUTH: Moist mucosal membrane. INTUBATED NECK: Supple.  PULMONARY: +rhonchi, +wheezing CARDIOVASCULAR: S1 and S2.  No murmurs  GASTROINTESTINAL: Soft, nontender, -distended. Positive bowel sounds.  MUSCULOSKELETAL: No swelling, clubbing, or edema.  NEUROLOGIC: obtunded SKIN:intact,warm,dry   Resolved Hospital Problem list   Hypertensive emergency Lactic acidosis Elevated LFTs  Assessment & Plan:   51 yo AAM with severe V. Fib arrest, left heart cath without significant coronary artery disease + angioedema from ACE inhibitor leading to asphyxiation and acute closure of upper airway leading to  severe hypoxic respiratory failure and now with severe brain damage  Severe ACUTE Hypoxic and Hypercapnic Respiratory Failure -continue Mechanical Ventilator support -Wean Fio2 and PEEP as tolerated -VAP/VENT bundle implementation - Wean PEEP & FiO2 as tolerated, maintain SpO2 > 88% - Head of bed elevated 30 degrees, VAP protocol in place - Plateau pressures less than 30 cm H20  -  Intermittent chest x-ray & ABG PRN - Ensure adequate pulmonary hygiene  -will  NOT perform SAT/SBT when respiratory parameters are met Will need TRACH and PEG tube for survival   Sepsis, unclear etiology Aspiration pneumonia is suspected -Continue  ceftriaxone -Follow up culture data -Trend fever curve, WBC count   NEUROLOGY ACUTE TOXIC METABOLIC ENCEPHALOPATHY -need for sedation -Goal RASS -2 to -3 Severe BRAIN DAMAGE MRI c/w HIE  ACUTE KIDNEY INJURY/Renal Failure -continue Foley Catheter-assess need -Avoid nephrotoxic agents -Follow urine output, BMP -Ensure adequate renal perfusion, optimize oxygenation -Renal dose medications   Intake/Output Summary (Last 24 hours) at 03/27/2021 0759 Last data filed at 03/27/2021 0600 Gross per 24 hour  Intake 1840.14 ml  Output 2750 ml  Net -909.86 ml    ENDO - ICU hypoglycemic\Hyperglycemia protocol -check FSBS per protocol   GI GI PROPHYLAXIS as indicated  NUTRITIONAL STATUS DIET-->TF's as tolerated Constipation protocol as indicated   ELECTROLYTES -follow labs as needed -replace as needed -pharmacy consultation and following    Best Practice (right click and "Reselect all SmartList Selections" daily)   Diet/type: tubefeeds DVT prophylaxis: LMWH GI prophylaxis: PPI Lines: N/A Foley:  Yes, and it is still needed Code Status:  full code   Labs   CBC: Recent Labs  Lab 03/23/21 0254 03/24/21 0513 03/25/21 0441 03/26/21 0534 03/27/21 0321  WBC 12.6* 15.3* 20.7* 21.2* 15.9*  NEUTROABS 5.8  --   --   --   --   HGB 14.8 15.7 14.4 13.5 12.6*  HCT 45.2 47.9 43.7 41.6 39.7  MCV 93.2 91.9 91.8 92.7 93.2  PLT 228 193 201 199 214     Basic Metabolic Panel: Recent Labs  Lab 03/23/21 0254 03/23/21 0623 03/23/21 1723 03/24/21 0513 03/24/21 1700 03/25/21 0441 03/26/21 0534 03/27/21 0321  NA 138  --   --  139  --  140 142 148*  K 3.5  --   --  3.6  --  3.9 3.9 4.0  CL 104  --   --  105  --  104 110 115*  CO2 20*  --   --  26  --  _0 GLUCOSE 249*  --   --  95  --  118* 154* 129*  BUN 26*  --   --  38*  --  58* 65* 74*  CREATININE 2.08*  --   --  1.91*  --  2.53* 1.87* 1.74*  CALCIUM 8.4*  --   --  8.4*  --  8.5* 8.7* 8.8*  MG  --  2.4 2.4  2.3 2.2  --   --  2.9*  PHOS  --  2.5 3.0 4.2 5.0*  --   --  4.5    GFR: Estimated Creatinine Clearance: 63.6 mL/min (A) (by C-G formula based on SCr of 1.74 mg/dL (H)). Recent Labs  Lab 03/24/21 0513 03/25/21 0441 03/26/21 0534 03/27/21 0321  WBC 15.3* 20.7* 21.2* 15.9*     Liver Function Tests: Recent Labs  Lab 03/23/21 0254 03/24/21 0513 03/25/21 0441 03/26/21 0534  AST 150* 123* 56* 31  ALT 130* 92* 63* 44  ALKPHOS 74 43 43 40  BILITOT 1.2 1.1 1.0 0.8  PROT 7.2 7.1 6.8 6.8  ALBUMIN 3.8 3.5 3.4* 3.0*    No results for input(s): LIPASE, AMYLASE in the last 168 hours. No results for input(s): AMMONIA in the last 168 hours.  ABG    Component Value Date/Time   PHART 7.43 03/24/2021 0500  PCO2ART 36 03/24/2021 0500   PO2ART 111 (H) 03/24/2021 0500   HCO3 23.9 03/24/2021 0500   ACIDBASEDEF 0.3 03/23/2021 0556   O2SAT 98.5 03/24/2021 0500     Coagulation Profile: Recent Labs  Lab 03/23/21 0254  INR 1.0     Cardiac Enzymes: No results for input(s): CKTOTAL, CKMB, CKMBINDEX, TROPONINI in the last 168 hours.  HbA1C: Hgb A1c MFr Bld  Date/Time Value Ref Range Status  03/23/2021 06:23 AM 5.4 4.8 - 5.6 % Final    Comment:    (NOTE) Pre diabetes:          5.7%-6.4%  Diabetes:              >6.4%  Glycemic control for   <7.0% adults with diabetes   03/23/2021 02:54 AM 5.5 4.8 - 5.6 % Final    Comment:    DUPLICATE AT 0045 99/77/4142. THE NEXT SAMPLE WAS CREDITED. (NOTE) Pre diabetes:          5.7%-6.4%  Diabetes:              >6.4%  Glycemic control for   <7.0% adults with diabetes     CBG: Recent Labs  Lab 03/26/21 0020 03/26/21 0400 03/26/21 0725 03/26/21 1118 03/26/21 1554  GLUCAP 148* 167* 131* 126* 145*     Review of Systems:   Cannot obtain due to intubation, AMS  Past Medical History:  He,  has a past medical history of Hypertension.   Surgical History:   Past Surgical History:  Procedure Laterality Date   BACK SURGERY      CORONARY/GRAFT ACUTE MI REVASCULARIZATION N/A 03/23/2021   Procedure: Coronary/Graft Acute MI Revascularization;  Surgeon: Isaias Cowman, MD;  Location: [place] [facility];  Service: Cardiovascular;  Laterality: N/A;   LEFT HEART CATH AND CORONARY ANGIOGRAPHY N/A 03/23/2021   Procedure: LEFT HEART CATH AND CORONARY ANGIOGRAPHY;  Surgeon: Isaias Cowman, MD;  Location: [place] [facility];  Service: Cardiovascular;  Laterality: N/A;   SHOULDER SURGERY       Social History:   reports that he has never smoked. He has never used smokeless tobacco. He reports current alcohol use. He reports that he does not use drugs.   Family History:  His family history is not on file.   Allergies Allergies  Allergen Reactions   Lisinopril Swelling    angioedema     Home Medications  Prior to Admission medications   Medication Sig Start Date End Date Taking? Authorizing Provider  lisinopril (PRINIVIL,ZESTRIL) 10 MG tablet Take 10 mg by mouth daily.  11/12/16   [provider]  lisinopril (ZESTRIL) 10 MG tablet Take 1 tablet (10 mg total) by mouth daily. 12/06/19 12/05/20  Johnn Hai, PA-C     Critical care time: 31 minutes    Flora Lipps, MD 03/27/21 7:54 AM   [facility] Prefer epic messenger for cross cover needs If after hours, please call E-link

## 2021-03-28 DIAGNOSIS — R1310 Dysphagia, unspecified: Secondary | ICD-10-CM

## 2021-03-28 LAB — BASIC METABOLIC PANEL
Anion gap: 9 (ref 5–15)
BUN: 78 mg/dL — ABNORMAL HIGH (ref 6–20)
CO2: 26 mmol/L (ref 22–32)
Calcium: 9 mg/dL (ref 8.9–10.3)
Chloride: 116 mmol/L — ABNORMAL HIGH (ref 98–111)
Creatinine, Ser: 1.9 mg/dL — ABNORMAL HIGH (ref 0.61–1.24)
GFR, Estimated: 42 mL/min — ABNORMAL LOW (ref >60)
Glucose, Bld: 125 mg/dL — ABNORMAL HIGH (ref 70–99)
Potassium: 4.2 mmol/L (ref 3.5–5.1)
Sodium: 151 mmol/L — ABNORMAL HIGH (ref 135–145)

## 2021-03-28 LAB — GLUCOSE, CAPILLARY
Glucose-Capillary: 112 mg/dL — ABNORMAL HIGH (ref 70–99)
Glucose-Capillary: 119 mg/dL — ABNORMAL HIGH (ref 70–99)
Glucose-Capillary: 114 mg/dL — ABNORMAL HIGH (ref 70–99)
Glucose-Capillary: 133 mg/dL — ABNORMAL HIGH (ref 70–99)
Glucose-Capillary: 143 mg/dL — ABNORMAL HIGH (ref 70–99)
Glucose-Capillary: 134 mg/dL — ABNORMAL HIGH (ref 70–99)
Glucose-Capillary: 113 mg/dL — ABNORMAL HIGH (ref 70–99)

## 2021-03-28 LAB — PHOSPHORUS: Phosphorus: 4 mg/dL (ref 2.5–4.6)

## 2021-03-28 LAB — CBC
HCT: 38.6 % — ABNORMAL LOW (ref 39.0–52.0)
Hemoglobin: 12.3 g/dL — ABNORMAL LOW (ref 13.0–17.0)
MCH: 29.9 pg (ref 26.0–34.0)
MCHC: 31.9 g/dL (ref 30.0–36.0)
MCV: 93.7 fL (ref 80.0–100.0)
Platelets: 245 10*3/uL (ref 150–400)
RBC: 4.12 MIL/uL — ABNORMAL LOW (ref 4.22–5.81)
RDW: 14.5 % (ref 11.5–15.5)
WBC: 13.3 10*3/uL — ABNORMAL HIGH (ref 4.0–10.5)
nRBC: 0 % (ref 0.0–0.2)

## 2021-03-28 LAB — CULTURE, RESPIRATORY W GRAM STAIN: Culture: NORMAL

## 2021-03-28 LAB — MAGNESIUM: Magnesium: 2.8 mg/dL — ABNORMAL HIGH (ref 1.7–2.4)

## 2021-03-28 MED ORDER — FREE WATER
100.0000 mL | Status: DC
  Administered 2021-03-28 – 2021-03-30 (×12): 100 mL

## 2021-03-28 MED ORDER — DEXTROSE 5 % IV SOLN: INTRAVENOUS | Status: DC

## 2021-03-28 NOTE — TOC Progression Note (Signed)
Transition of Care [facility]) - Progression Note    Patient Details  Name: Billy Cherry MRN: [id number] Date of Birth: 12-27-1970  [facility]) CM/SW Contact  Allayne Butcher, RN Phone Number: 03/28/2021, 4:29 PM  Clinical Narrative:    Patient's wife has decided to proceed with trach and peg.  Peg tube placement scheduled for tomorrow, trach not scheduled yet.  RNCM will speak with patient's wife tomorrow about LTAC placement.     Expected Discharge Plan:  (TBD) Barriers to Discharge: Continued Medical Work up  Expected Discharge Plan and Services Expected Discharge Plan:  (TBD)       Living arrangements for the past 2 months: Single Family Home                                       Social Determinants of Health (SDOH) Interventions    Readmission Risk Interventions No flowsheet data found.

## 2021-03-28 NOTE — Consult Note (Signed)
Wyline Mood , MD [address], [place], [place], [address] [address], [place], [place], [address] Phone: [phone]  Fax: [phone]  Consultation  Referring Provider:     Dr Belia Heman Primary Care Physician:  Patient, No Pcp Per (Inactive) Primary Gastroenterologist:  None         Reason for Consultation:     G tube   Date of Admission:  03/23/2021 Date of Consultation:  03/28/2021         HPI:   Billy Cherry is a 51 y.o. male was admitted to the hospital after an out-of-hospital cardiac arrest on 03/22/2021.  Etiology of cardiac arrest was unclear felt secondary to cardiac arrhythmia versus angioedema secondary to lisinopril.  He is on heparin GTT for NSTEMI.  I have been called to place a G-tube.  He has hypoxic brain injury.  But not into the room to see the patient in the ICU there was no family member by the side.  I tried calling patient's spouse Mrs  Billy Cherry listed on epic did not get any response.  Patient unable to get history.  History obtained from prior chart notes and discussion with Dr. Belia Heman.  Plan is to obtain a G-tube and a tracheostomy.  Past Medical History:  Diagnosis Date   Hypertension     Past Surgical History:  Procedure Laterality Date   BACK SURGERY     CORONARY/GRAFT ACUTE MI REVASCULARIZATION N/A 03/23/2021   Procedure: Coronary/Graft Acute MI Revascularization;  Surgeon: Marcina Millard, MD;  Location: [facility] INVASIVE CV LAB;  Service: Cardiovascular;  Laterality: N/A;   LEFT HEART CATH AND CORONARY ANGIOGRAPHY N/A 03/23/2021   Procedure: LEFT HEART CATH AND CORONARY ANGIOGRAPHY;  Surgeon: Marcina Millard, MD;  Location: [facility] INVASIVE CV LAB;  Service: Cardiovascular;  Laterality: N/A;   SHOULDER SURGERY      Prior to Admission medications   Medication Sig Start Date End Date Taking? Authorizing Provider  lisinopril (PRINIVIL,ZESTRIL) 10 MG tablet Take 10 mg by mouth daily.  11/12/16  Yes [provider]   sildenafil (VIAGRA) 25 MG tablet Take by mouth as directed. 08/07/19  Yes [provider]  lisinopril (ZESTRIL) 10 MG tablet Take 1 tablet (10 mg total) by mouth daily. 12/06/19 12/05/20  Tommi Rumps, PA-C    No family history on file.   Social History   Tobacco Use   Smoking status: Never   Smokeless tobacco: Never  Substance Use Topics   Alcohol use: Yes   Drug use: Never    Allergies as of 03/23/2021   (No Known Allergies)    Review of Systems:    Patient unable to provide review of systems   Physical Exam:  Vital signs in last 24 hours: Temp:  [97.9 F (36.6 C)-101.5 F (38.6 C)] 99.9 F (37.7 C) (01/24 0800) Pulse Rate:  [51-102] 95 (01/24 0803) Resp:  [7-23] 15 (01/24 0800) BP: (112-197)/(68-140) 181/114 (01/24 0803) SpO2:  [90 %-100 %] 98 % (01/24 0800) FiO2 (%):  [30 %] 30 % (01/24 0747) Weight:  [102.2 kg] 102.2 kg (01/24 0500) Last BM Date: 03/28/21 General:   Intubated and sedated Head:  Normocephalic and atraumatic. Eyes:   No icterus.   Conjunctiva pink.  Ears: Cannot assess Neck:  Supple; no masses or thyroidomegaly Lungs: Respirations even and unlabored. Lungs clear to auscultation bilaterally.   No wheezes, crackles, or rhonchi.  Heart:  Regular rate and rhythm;  Without murmur, clicks, rubs or gallops Abdomen:  Soft, nondistended, nontender. Normal bowel sounds. No appreciable masses or hepatomegaly.  No rebound or guarding.  Neurologic:  Alert and oriented x0  Psych: Cannot assess  LAB RESULTS: Recent Labs    03/26/21 0534 04/08/2021 0321 03/28/21 0538  WBC 21.2* 15.9* 13.3*  HGB 13.5 12.6* 12.3*  HCT 41.6 39.7 38.6*  PLT 199 214 245   BMET Recent Labs    03/26/21 0534 2021/04/08 0321 03/28/21 0538  NA 142 148* 151*  K 3.9 4.0 4.2  CL 110 115* 116*  CO2 24 25 26   GLUCOSE 154* 129* 125*  BUN 65* 74* 78*  CREATININE 1.87* 1.74* 1.90*  CALCIUM 8.7* 8.8* 9.0   LFT Recent Labs    03/26/21 0534  PROT 6.8  ALBUMIN  3.0*  AST 31  ALT Cherry  ALKPHOS 40  BILITOT 0.8   PT/INR No results for input(s): LABPROT, INR in the last 72 hours.  STUDIES: EEG adult  Result Date: Apr 08, 2021 03/29/2021, MD     04/08/21  7:46 PM Patient Name: Billy Cherry MRN: Elayne Guerin Epilepsy Attending: [id number] Referring Physician/Provider: Charlsie Quest, MD Date: 2021/04/08 Duration: 22.26 mins Patient history: 51 year old gentleman with history of hypertension who is admitted after cardiac arrest. EEG to evaluate for seizure Level of alertness: lethargic AEDs during EEG study: LEV Technical aspects: This EEG study was done with scalp electrodes positioned according to the 10-20 International system of electrode placement. Electrical activity was acquired at a sampling rate of 500Hz  and reviewed with a high frequency filter of 70Hz  and a low frequency filter of 1Hz . EEG data were recorded continuously and digitally stored. Description: EEG showed near continuous generalized 3 to 5 Hz theta-delta slowing admixed with 1-3 seconds of intermittent eeg attenuation. Generalized spikes were noted intermittently. Hyperventilation and photic stimulation were not performed.   ABNORMALITY - Spike,generalized - Continuous slow, generalized IMPRESSION: This study showed evidence of epileptogenicity with generalized onset. There is also moderate diffuse encephalopathy, nonspecific etiology. No seizures were seen throughout the recording. Billy Cherry      Impression / Plan:   Billy Cherry is a 51 y.o. y/o male with hypoxic brain injury secondary to cardiac arrest on 03/22/2021.  Etiology of cardiac arrest determined to be secondary to cardiac arrhythmia or angioedema secondary to ACE inhibitor.  I have been consulted for G tube.  I tried calling the patient's wife to discuss about the procedure and obtained consent on the phone.  Unable to reach her.  I will put him on the schedule for the procedure for tomorrow afternoon.  Would  advise to stop tube feed at midnight today.  Hold any heparin if given appropriately for a few hours before the procedure.  I have discussed it with Dr. Annabelle Harman  Thank you for involving me in the care of this patient.      LOS: 5 days   Elayne Guerin, MD  03/28/2021, 9:34 AM

## 2021-03-28 NOTE — Consult Note (Signed)
..  Maleik, Gulla NP:[id number] Jul 19, 1970 Flora Lipps, MD  Reason for Consult: tracheostomy tube placement  HPI: 51 y.o. male s/p cardiac arrest and anoxic brain injury presumed secondary to angioedema due to ACE-I.  Patient with significant anoxic brain injury per records and they anticipate a need for long term ventilatory support.  Asked to evaluate patient for trach placement.  Patient was intubated on 03/23/2021 after EMS call and need for CPR.  Allergies:  Allergies  Allergen Reactions   Lisinopril Swelling    angioedema    ROS: Review of systems normal other than 12 systems except per HPI.  PMH:  Past Medical History:  Diagnosis Date   Hypertension     FH: No family history on file.  SH:  Social History   Socioeconomic History   Marital status: Married    Spouse name: Not on file   Number of children: Not on file   Years of education: Not on file   Highest education level: Not on file  Occupational History   Not on file  Tobacco Use   Smoking status: Never   Smokeless tobacco: Never  Substance and Sexual Activity   Alcohol use: Yes   Drug use: Never   Sexual activity: Not on file  Other Topics Concern   Not on file  Social History Narrative   Not on file   Social Determinants of Health   Financial Resource Strain: Not on file  Food Insecurity: Not on file  Transportation Needs: Not on file  Physical Activity: Not on file  Stress: Not on file  Social Connections: Not on file  Intimate Partner Violence: Not on file    PSH:  Past Surgical History:  Procedure Laterality Date   BACK SURGERY     CORONARY/GRAFT ACUTE MI REVASCULARIZATION N/A 03/23/2021   Procedure: Coronary/Graft Acute MI Revascularization;  Surgeon: Isaias Cowman, MD;  Location: [place] [facility];  Service: Cardiovascular;  Laterality: N/A;   LEFT HEART CATH AND CORONARY ANGIOGRAPHY N/A 03/23/2021   Procedure: LEFT HEART CATH AND CORONARY ANGIOGRAPHY;  Surgeon: Isaias Cowman, MD;  Location: [place] [facility];  Service: Cardiovascular;  Laterality: N/A;   SHOULDER SURGERY      Physical  Exam:  GEN-  NAD supine in bed intubated NEURO- no voluntary response to commands during exam NECK-  normal landmarks and thyroid NOSE- clear anteriorly OC/OP- ETT secure and in place RESP- ventilator support   A/P: Anoxic brain injury from presumed angioedema from lisinopril and need for long term ventilatory support  Plan:  Will place on schedule for tracheostomy tube early next week, hopefully Tuesday.  Discussed tentative plan with wife who understands that the tracheostomy tube is needed for long term ventilatory support.  She did mention that she did need some time to be sure regarding the decision to proceed.  I discussed that I would talk with her again at beginning of next week to ensure she continues to wish to proceed and obtain consent at that time.   Jeannie Fend Luci Bellucci 03/28/2021 4:51 PM

## 2021-03-28 NOTE — Progress Notes (Signed)
Subjective: Patient remains unresponsive and will not open eyes to command.    Objective: Current vital signs: BP (!) 172/113    Pulse 84    Temp 99.7 F (37.6 C)    Resp (!) 21    Ht 5' 10.98" (1.803 m)    Wt 102.2 kg    SpO2 96%    BMI 31.44 kg/m  Vital signs in last 24 hours: Temp:  [97.9 F (36.6 C)-101.5 F (38.6 C)] 99.7 F (37.6 C) (01/24 1300) Pulse Rate:  [67-102] 84 (01/24 1300) Resp:  [15-23] 21 (01/24 1300) BP: (117-181)/(80-114) 172/113 (01/24 1418) SpO2:  [93 %-100 %] 96 % (01/24 1300) FiO2 (%):  [30 %] 30 % (01/24 1212) Weight:  [102.2 kg] 102.2 kg (01/24 0500)  Intake/Output from previous day: 01/23 0701 - 01/24 0700 In: 905.9 [NG/GT:756.9; IV Piggyback:149] Out: 2400 [Urine:2150; Stool:250] Intake/Output this shift: Total I/O In: 340.3 [I.V.:140.3; IV Piggyback:200] Out: 600 [Urine:600] Nutritional status:  Diet Order             Diet NPO time specified  Diet effective now                  HEENT: [place]/AT Lungs: Intubated Ext: Warm and well perfused   Neurologic Exam:  Ment: No response to loud clapping. Spontaneously chews on ETT after his name is called out loud several times. CN: Pupil pinpoint on the left and 2-3 mm with sluggish reactivity on the right. No blink to threat. Eyes esotropic today with slight roving EOM spontaneously; intermittent skew deviation noted with left eye supraducted relative to the right. Weak doll's eye reflex. Weak corneal reflexes. Face flaccidly symmetric.   Motor/Sensory: BUE with flaccid tone and no movement to noxious.  BLE with flaccid tone. Trace movement of BLE to noxious, otherwise no movement.  Reflexes: Hypoactive x 4 Cerebellar/Gait: Unable to test.   Lab Results: Results for orders placed or performed during the hospital encounter of 03/23/21 (from the past 48 hour(s))  Glucose, capillary     Status: Abnormal   Collection Time: 03/26/21  3:54 PM  Result Value Ref Range   Glucose-Capillary 145 (H) 70 -  99 mg/dL    Comment: Glucose reference range applies only to samples taken after fasting for at least 8 hours.  CBC     Status: Abnormal   Collection Time: 03/27/21  3:21 AM  Result Value Ref Range   WBC 15.9 (H) 4.0 - 10.5 K/uL   RBC 4.26 4.22 - 5.81 MIL/uL   Hemoglobin 12.6 (L) 13.0 - 17.0 g/dL   HCT 39.7 39.0 - 52.0 %   MCV 93.2 80.0 - 100.0 fL   MCH 29.6 26.0 - 34.0 pg   MCHC 31.7 30.0 - 36.0 g/dL   RDW 14.5 11.5 - 15.5 %   Platelets 214 150 - 400 K/uL   nRBC 0.0 0.0 - 0.2 %    Comment: Performed at [facility], [address] [place], [place] [address]  Basic metabolic panel     Status: Abnormal   Collection Time: 03/27/21  3:21 AM  Result Value Ref Range   Sodium 148 (H) 135 - 145 mmol/L   Potassium 4.0 3.5 - 5.1 mmol/L   Chloride 115 (H) 98 - 111 mmol/L   CO2 25 22 - 32 mmol/L   Glucose, Bld 129 (H) 70 - 99 mg/dL    Comment: Glucose reference range applies only to samples taken after fasting for at least 8 hours.  BUN 74 (H) 6 - 20 mg/dL   Creatinine, Ser 1.74 (H) 0.61 - 1.24 mg/dL   Calcium 8.8 (L) 8.9 - 10.3 mg/dL   GFR, Estimated 47 (L) >60 mL/min    Comment: (NOTE) Calculated using the CKD-EPI Creatinine Equation (2021)    Anion gap 8 5 - 15    Comment: Performed at [facility], [address] [place], [place] [address]  Magnesium     Status: Abnormal   Collection Time: 03/27/21  3:21 AM  Result Value Ref Range   Magnesium 2.9 (H) 1.7 - 2.4 mg/dL    Comment: Performed at [facility], [address] [place], [place] [address]  Phosphorus     Status: None   Collection Time: 03/27/21  3:21 AM  Result Value Ref Range   Phosphorus 4.5 2.5 - 4.6 mg/dL    Comment: Performed at [facility], [address] [place], [place] [address]  Glucose, capillary     Status: Abnormal   Collection Time: 03/27/21 11:43 AM  Result Value Ref Range   Glucose-Capillary 108 (H) 70 - 99 mg/dL    Comment: Glucose reference range applies  only to samples taken after fasting for at least 8 hours.  Glucose, capillary     Status: Abnormal   Collection Time: 03/27/21  3:39 PM  Result Value Ref Range   Glucose-Capillary 130 (H) 70 - 99 mg/dL    Comment: Glucose reference range applies only to samples taken after fasting for at least 8 hours.  Glucose, capillary     Status: Abnormal   Collection Time: 03/27/21  7:43 PM  Result Value Ref Range   Glucose-Capillary 112 (H) 70 - 99 mg/dL    Comment: Glucose reference range applies only to samples taken after fasting for at least 8 hours.   Comment 1 Notify RN    Comment 2 Document in Chart   Glucose, capillary     Status: Abnormal   Collection Time: 03/27/21 11:22 PM  Result Value Ref Range   Glucose-Capillary 119 (H) 70 - 99 mg/dL    Comment: Glucose reference range applies only to samples taken after fasting for at least 8 hours.   Comment 1 Notify RN    Comment 2 Document in Chart   Glucose, capillary     Status: Abnormal   Collection Time: 03/28/21  4:00 AM  Result Value Ref Range   Glucose-Capillary 114 (H) 70 - 99 mg/dL    Comment: Glucose reference range applies only to samples taken after fasting for at least 8 hours.  CBC     Status: Abnormal   Collection Time: 03/28/21  5:38 AM  Result Value Ref Range   WBC 13.3 (H) 4.0 - 10.5 K/uL   RBC 4.12 (L) 4.22 - 5.81 MIL/uL   Hemoglobin 12.3 (L) 13.0 - 17.0 g/dL   HCT 38.6 (L) 39.0 - 52.0 %   MCV 93.7 80.0 - 100.0 fL   MCH 29.9 26.0 - 34.0 pg   MCHC 31.9 30.0 - 36.0 g/dL   RDW 14.5 11.5 - 15.5 %   Platelets 245 150 - 400 K/uL   nRBC 0.0 0.0 - 0.2 %    Comment: Performed at [facility], [address], [place], [place] [address]  Basic metabolic panel     Status: Abnormal   Collection Time: 03/28/21  5:38 AM  Result Value Ref Range   Sodium 151 (H) 135 - 145 mmol/L   Potassium 4.2 3.5 -  5.1 mmol/L   Chloride 116 (H) 98 - 111 mmol/L   CO2 26 22 - 32 mmol/L   Glucose, Bld 125 (H) 70 - 99 mg/dL    Comment:  Glucose reference range applies only to samples taken after fasting for at least 8 hours.   BUN 78 (H) 6 - 20 mg/dL   Creatinine, Ser 1.90 (H) 0.61 - 1.24 mg/dL   Calcium 9.0 8.9 - 10.3 mg/dL   GFR, Estimated 42 (L) >60 mL/min    Comment: (NOTE) Calculated using the CKD-EPI Creatinine Equation (2021)    Anion gap 9 5 - 15    Comment: Performed at [facility], [address] [place], [place] [address]  Magnesium     Status: Abnormal   Collection Time: 03/28/21  5:38 AM  Result Value Ref Range   Magnesium 2.8 (H) 1.7 - 2.4 mg/dL    Comment: Performed at [facility], [address] [place], [place] [address]  Phosphorus     Status: None   Collection Time: 03/28/21  5:38 AM  Result Value Ref Range   Phosphorus 4.0 2.5 - 4.6 mg/dL    Comment: Performed at [facility] [address] [place], [place] [address]  Glucose, capillary     Status: Abnormal   Collection Time: 03/28/21  7:20 AM  Result Value Ref Range   Glucose-Capillary 133 (H) 70 - 99 mg/dL    Comment: Glucose reference range applies only to samples taken after fasting for at least 8 hours.  Glucose, capillary     Status: Abnormal   Collection Time: 03/28/21 11:25 AM  Result Value Ref Range   Glucose-Capillary 143 (H) 70 - 99 mg/dL    Comment: Glucose reference range applies only to samples taken after fasting for at least 8 hours.    Recent Results (from the past 240 hour(s))  Resp Panel by RT-PCR (Flu A&B, Covid) Nasopharyngeal Swab     Status: None   Collection Time: 03/23/21  2:54 AM   Specimen: Nasopharyngeal Swab; Nasopharyngeal(NP) swabs in vial transport medium  Result Value Ref Range Status   SARS Coronavirus 2 by RT PCR NEGATIVE NEGATIVE Final    Comment: (NOTE) SARS-CoV-2 target nucleic acids are NOT DETECTED.  The SARS-CoV-2 RNA is generally detectable in upper respiratory specimens during the acute phase of infection. The lowest concentration of SARS-CoV-2 viral copies  this assay can detect is 138 copies/mL. A negative result does not preclude SARS-Cov-2 infection and should not be used as the sole basis for treatment or other patient management decisions. A negative result may occur with  improper specimen collection/handling, submission of specimen other than nasopharyngeal swab, presence of viral mutation(s) within the areas targeted by this assay, and inadequate number of viral copies(<138 copies/mL). A negative result must be combined with clinical observations, patient history, and epidemiological information. The expected result is Negative.  Fact Sheet for Patients:  EntrepreneurPulse.com.au  Fact Sheet for Healthcare Providers:  IncredibleEmployment.be  This test is no t yet approved or cleared by the [place] FDA and  has been authorized for detection and/or diagnosis of SARS-CoV-2 by FDA under an Emergency Use Authorization (EUA). This EUA will remain  in effect (meaning this test can be used) for the duration of the COVID-19 declaration under Section 564(b)(1) of the Act, 21 U.S.C.section 360bbb-3(b)(1), unless the authorization is terminated  or revoked sooner.       Influenza A by PCR NEGATIVE NEGATIVE Final   Influenza B by  PCR NEGATIVE NEGATIVE Final    Comment: (NOTE) The Xpert Xpress SARS-CoV-2/FLU/RSV plus assay is intended as an aid in the diagnosis of influenza from Nasopharyngeal swab specimens and should not be used as a sole basis for treatment. Nasal washings and aspirates are unacceptable for Xpert Xpress SARS-CoV-2/FLU/RSV testing.  Fact Sheet for Patients: EntrepreneurPulse.com.au  Fact Sheet for Healthcare Providers: IncredibleEmployment.be  This test is not yet approved or cleared by the [place] FDA and has been authorized for detection and/or diagnosis of SARS-CoV-2 by FDA under an Emergency Use Authorization (EUA). This EUA  will remain in effect (meaning this test can be used) for the duration of the COVID-19 declaration under Section 564(b)(1) of the Act, 21 U.S.C. section 360bbb-3(b)(1), unless the authorization is terminated or revoked.  Performed at [facility], [address] [place], [place] [address]   MRSA Next Gen by PCR, Nasal     Status: None   Collection Time: 03/23/21  4:20 AM   Specimen: Nasal Mucosa; Nasal Swab  Result Value Ref Range Status   MRSA by PCR Next Gen NOT DETECTED NOT DETECTED Final    Comment: (NOTE) The GeneXpert MRSA Assay (FDA approved for NASAL specimens only), is one component of a comprehensive MRSA colonization surveillance program. It is not intended to diagnose MRSA infection nor to guide or monitor treatment for MRSA infections. Test performance is not FDA approved in patients less than 55 years old. Performed at [facility], [address] [place], [place] [address]   Culture, Respiratory w Gram Stain     Status: None   Collection Time: 03/25/21  3:37 PM   Specimen: Tracheal Aspirate; Respiratory  Result Value Ref Range Status   Specimen Description   Final    TRACHEAL ASPIRATE Performed at [facility], [address] [place], [place] [address]    Special Requests   Final    NONE Performed at [facility], [address] [place], [place] [address]    Gram Stain   Final    MODERATE WBC PRESENT,BOTH PMN AND MONONUCLEAR MODERATE GRAM POSITIVE RODS FEW GRAM POSITIVE COCCI RARE GRAM NEGATIVE RODS    Culture   Final    Normal respiratory flora-no Staph aureus or Pseudomonas seen Performed at [facility], [address] [place], [place] [address]    Report Status 03/28/2021 FINAL  Final  CULTURE, BLOOD (ROUTINE X 2) w Reflex to ID Panel     Status: None (Preliminary result)   Collection Time: 03/25/21  5:24 PM   Specimen: BLOOD  Result Value Ref Range Status   Specimen Description BLOOD BLOOD RIGHT  HAND  Final   Special Requests   Final    BOTTLES DRAWN AEROBIC AND ANAEROBIC Blood Culture adequate volume   Culture   Final    NO GROWTH 3 DAYS Performed at [facility], [address], [place],  [address]    Report Status PENDING  Incomplete  CULTURE, BLOOD (ROUTINE X 2) w Reflex to ID Panel     Status: None (Preliminary result)   Collection Time: 03/25/21  5:32 PM   Specimen: BLOOD  Result Value Ref Range Status   Specimen Description BLOOD BLOOD LEFT HAND  Final   Special Requests   Final    BOTTLES DRAWN AEROBIC AND ANAEROBIC Blood Culture adequate volume   Culture   Final    NO GROWTH 3 DAYS Performed at [facility], [place], [place]  [address]    Report Status PENDING  Incomplete    Lipid Panel No results for input(s): CHOL, TRIG, HDL, CHOLHDL, VLDL, LDLCALC in the last 72 hours.  Studies/Results: EEG adult  Result Date: 03/27/2021 Lora Havens, MD     03/27/2021  7:46 PM Patient Name: Billy Cherry MRN: [id number] Epilepsy Attending: Lora Havens Referring Physician/Provider: Derek Jack, MD Date: 03/27/2021 Duration: 22.26 mins Patient history: 51 year old gentleman with history of hypertension who is admitted after cardiac arrest. EEG to evaluate for seizure Level of alertness: lethargic AEDs during EEG study: LEV Technical aspects: This EEG study was done with scalp electrodes positioned according to the 10-20 International system of electrode placement. Electrical activity was acquired at a sampling rate of $Remov'500Hz'PIQMXj$  and reviewed with a high frequency filter of $RemoveB'70Hz'DVnoiaIa$  and a low frequency filter of $RemoveB'1Hz'pvJdUlTx$ . EEG data were recorded continuously and digitally stored. Description: EEG showed near continuous generalized 3 to 5 Hz theta-delta slowing admixed with 1-3 seconds of intermittent eeg attenuation. Generalized spikes were noted intermittently. Hyperventilation and photic stimulation were not performed.   ABNORMALITY -  Spike,generalized - Continuous slow, generalized IMPRESSION: This study showed evidence of epileptogenicity with generalized onset. There is also moderate diffuse encephalopathy, nonspecific etiology. No seizures were seen throughout the recording. Priyanka Barbra Sarks    Medications: Scheduled:  carvedilol  25 mg Per Tube BID WC   chlorhexidine gluconate (MEDLINE KIT)  15 mL Mouth Rinse BID   Chlorhexidine Gluconate Cloth  6 each Topical Q0600   enoxaparin (LOVENOX) injection  0.5 mg/kg Subcutaneous Q24H   feeding supplement (PROSource TF)  45 mL Per Tube BID   free water  100 mL Per Tube Q4H   hydrALAZINE  25 mg Per Tube Q8H   insulin aspart  0-15 Units Subcutaneous Q4H   isosorbide dinitrate  10 mg Per Tube TID   mouth rinse  15 mL Mouth Rinse 10 times per day   pantoprazole sodium  40 mg Per Tube Daily   sodium chloride flush  3 mL Intravenous Q12H   Continuous:  sodium chloride     sodium chloride Stopped (03/23/21 0445)   cefTRIAXone (ROCEPHIN)  IV 200 mL/hr at 03/28/21 1803   dextrose 50 mL/hr at 03/28/21 1803   feeding supplement (VITAL HIGH PROTEIN) 65 mL/hr at 03/28/21 1803   levETIRAcetam Stopped (03/28/21 0855)    Assessment: 51 year old male with history of hypertension who is admitted after cardiac arrest 03/22/21.  Etiology of arrest is unclear, given insignificant CAD on cath. Cardiology suspects this was 2/2 primary VF event vs possible angioedema 2/2 his lisinopril. He has been noted to have severely depressed EF (not present on left ventriculography during cath) and has completed heparin gtt for NSTEMI. He has intact brainstem reflexes on exam, but does not follow commands or have any volitional motor response in his extremities to pain. MRI brain wo contrast does show e/o anoxic brain injury.  - Continues to not have any twitching on repeat neurological exam today. However, recommend continuation of Keppra for now.  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 - EEG 1/19 did not show any epileptiform abnl. - Repeat EEG Monday 1/23: This study showed evidence of epileptogenicity with generalized onset. There is also moderate diffuse encephalopathy, nonspecific to etiology. No seizures were seen throughout the recording - Formal prognostication exam has been delayed 2/2 recent sedation and new infection (VAP).  Recommendations: - Continue Keppra 500 mg bid - Plan for repeat neurologic exam after patient has been off sedation for > 72 hrs, which should be on Thursday. Per MAR, the last dose of sedative was Dilaudid on Monday at 1101.   35 minutes spent in the neurological evaluation and management of this critically ill patient.    LOS: 5 days   '@Electronically'$  signed: Dr. Kerney Elbe 03/28/2021  3:00 PM

## 2021-03-28 NOTE — Progress Notes (Signed)
GOALS OF CARE DISCUSSION  The Clinical status was relayed to family in detail. Wife over the phone Updated and notified of patients medical condition.   Patient remains unresponsive and will not open eyes to command.   Patient is having a weak cough and struggling to remove secretions.    Explained to family course of therapy and the modalities    Patient with Progressive multiorgan failure with a very high probablity of a very minimal chance of meaningful recovery despite all aggressive and optimal medical therapy.  PATIENT REMAINS FULL CODE  Family understands the situation. Patient will need trach and PEG tube.  ENT and GI consulted  Family are satisfied with Plan of action and management. All questions answered  Additional CC time 25 mins   Charan Prieto Santiago Glad, M.D.  [facility]  Medical Director [facility] Medical Director [facility] Cardio-Pulmonary Department

## 2021-03-28 NOTE — Consult Note (Signed)
[place] for Electrolyte Monitoring and Replacement   Recent Labs: Potassium (mmol/L)  Date Value  03/28/2021 4.2   Magnesium (mg/dL)  Date Value  03/28/2021 2.8 (H)   Calcium (mg/dL)  Date Value  03/28/2021 9.0   Albumin (g/dL)  Date Value  03/26/2021 3.0 (L)   Phosphorus (mg/dL)  Date Value  03/28/2021 4.0   Sodium (mmol/L)  Date Value  03/28/2021 151 (H)   Assessment: 51 year old male with hypertension BIB EMS for STEMI found to be in Vfib on arrival. Patient converted to asystole after defibrillation by EMS, but subsequently with ROSC after CPR/epi. He was intubated in the ED. Patient was taken to the cath lab and was found to have no significant CAD and an LVEF <20%. Patient was then transferred to the CCU. Pharmacy has been consulted to monitor and replace electrolytes.   Na: [id number]  Nutrition: tube feeds @40 >55mL/hr (1513mL of free water) + supplements BID, free water 153mL q6H  Goal of Therapy:  Potassium ~ 4, magnesium ~ 2, all other electrolytes within normal limits  Plan:  Scr (1.74>1.9). Na+ uptrending despite inc'd TF and free water flushes. Will d/w MD inc'd FWF and/or D5W. No other replacement indicated today Will continue to monitor with labs tomorrow  Lorna Dibble, PharmD, [facility] Clinical Pharmacist 03/28/2021 8:56 AM

## 2021-03-28 NOTE — Progress Notes (Signed)
GI note  Spoke with the patient's wife [name] explained the process of a G-tube placement under endoscopy.  Discussed the risks and benefits including but not limited to infection, hemorrhage, perforation, damage to internal organs.  She agreed to proceed with the procedure  Dr Wyline Mood MD,MRCP [facility]) Gastroenterology/Hepatology Pager: [phone]

## 2021-03-28 NOTE — Progress Notes (Signed)
[facility] Cardiology  CARDIOLOGY CONSULT NOTE  Patient ID: Billy Cherry MRN: [id number] DOB/AGE: Sep 29, 1970 50 y.o.  Admit date: 03/23/2021 Referring Physician [facility] Primary Physician Terisa Starr [facility] family med (last seen 07/2019) Primary Cardiologist  Reason for Consultation cardiopulmonary arrest  HPI: 51 year old gentleman with a PMH significant for hypertension and hyperlipidemia referred for cardiopulmonary arrest with suspicion for ST elevation myocardial infarction.  Patient has a history of essential hypertension.  Patient was sleeping, found by his wife agonal respirations, called EMS, and performed bystander CPR.  On EMS arrival patient was found to be in ventricular fibrillation and defibrillated x1, converted to asystole given 2 rounds of epinephrine.  Post defibrillation ECG revealed sinus tachycardia with right bundle branch block and apparent ST elevation in anterolateral leads.  Following arrival at [facility] ED, patient was intubated.  ECG in the ED revealed minus rhythm 85 bpm with nonspecific ST-T abnormalities the anterolateral leads.  The patient was brought to the cardiac catheterization laboratory coronary angiography revealed insignificant coronary artery disease.  Echocardiogram revealed LVEF less than 20% with global hypokinesis.  Further history was provided per the patient's wife and the inciting event was thought to be angioedema causing hypoxia and subsequent events secondary to ACE/ARB use.  Interval history: -BP creeping up to peak of 191/108 this morning.  -remains on ventilator and off sedation, GI planning for G tube today   Review of systems unable to be assessed due to patient's sedation.   Past Medical History:  Diagnosis Date   Hypertension     Past Surgical History:  Procedure Laterality Date   BACK SURGERY     CORONARY/GRAFT ACUTE MI REVASCULARIZATION N/A 03/23/2021   Procedure: Coronary/Graft Acute MI Revascularization;  Surgeon: Marcina Millard, MD;   Location: [facility] INVASIVE CV LAB;  Service: Cardiovascular;  Laterality: N/A;   LEFT HEART CATH AND CORONARY ANGIOGRAPHY N/A 03/23/2021   Procedure: LEFT HEART CATH AND CORONARY ANGIOGRAPHY;  Surgeon: Marcina Millard, MD;  Location: [facility] INVASIVE CV LAB;  Service: Cardiovascular;  Laterality: N/A;   SHOULDER SURGERY      Medications Prior to Admission  Medication Sig Dispense Refill Last Dose   lisinopril (PRINIVIL,ZESTRIL) 10 MG tablet Take 10 mg by mouth daily.    03/22/2021   sildenafil (VIAGRA) 25 MG tablet Take by mouth as directed.   Past Month   lisinopril (ZESTRIL) 10 MG tablet Take 1 tablet (10 mg total) by mouth daily. 30 tablet 1     Social History   Socioeconomic History   Marital status: Married    Spouse name: Not on file   Number of children: Not on file   Years of education: Not on file   Highest education level: Not on file  Occupational History   Not on file  Tobacco Use   Smoking status: Never   Smokeless tobacco: Never  Substance and Sexual Activity   Alcohol use: Yes   Drug use: Never   Sexual activity: Not on file  Other Topics Concern   Not on file  Social History Narrative   Not on file   Social Determinants of Health   Financial Resource Strain: Not on file  Food Insecurity: Not on file  Transportation Needs: Not on file  Physical Activity: Not on file  Stress: Not on file  Social Connections: Not on file  Intimate Partner Violence: Not on file    No family history on file.   Review of systems complete and found to be negative unless listed above  PHYSICAL EXAM General: Well developed Black male, intubated.  HEENT:  Normocephalic and atraumatic.  Neck:  No JVD.  Lungs: intubated, Clear to ascultation anteriorly  Heart: HRRR . Normal S1 and S2 without gallops or murmurs.  Abdomen: soft, Nondistended appearing Msk: no spontaneous movement. Extremities: No clubbing, cyanosis or edema.   Neuro: some spontaneous blinking to voice and  small movement of his lips but no other spontaneous motor function.  Psych: Unable to assess  Labs:   Lab Results  Component Value Date   WBC 13.3 (H) 03/28/2021   HGB 12.3 (L) 03/28/2021   HCT 38.6 (L) 03/28/2021   MCV 93.7 03/28/2021   PLT 245 03/28/2021    Recent Labs  Lab 03/26/21 0534 03/27/21 0321 03/28/21 0538  NA 142   < > 151*  K 3.9   < > 4.2  CL 110   < > 116*  CO2 24   < > 26  BUN 65*   < > 78*  CREATININE 1.87*   < > 1.90*  CALCIUM 8.7*   < > 9.0  PROT 6.8  --   --   BILITOT 0.8  --   --   ALKPHOS 40  --   --   ALT 44  --   --   AST 31  --   --   GLUCOSE 154*   < > 125*   < > = values in this interval not displayed.    No results found for: CKTOTAL, CKMB, CKMBINDEX, TROPONINI  Lab Results  Component Value Date   CHOL 254 (H) 03/23/2021   Lab Results  Component Value Date   HDL 52 03/23/2021   Lab Results  Component Value Date   LDLCALC 174 (H) 03/23/2021   Lab Results  Component Value Date   TRIG 142 03/24/2021   TRIG 138 03/23/2021   Lab Results  Component Value Date   CHOLHDL 4.9 03/23/2021   No results found for: LDLDIRECT    Radiology: DG Chest 1 View  Result Date: 03/23/2021 CLINICAL DATA:  51 year old male status post cardiopulmonary arrest, out of hospital CPR. Altered mental status. EXAM: CHEST  1 VIEW COMPARISON:  Portable chest 0308 hours. FINDINGS: Portable AP upright view at 0458 hours. Endotracheal tube tip in good position between the clavicles and carina. Enteric tube terminates in the left upper quadrant. Pacer pads over the left lower chest. Normal cardiac size and mediastinal contours. Allowing for portable technique the lungs are clear. Visible bowel-gas within normal limits. No displaced rib fracture. IMPRESSION: 1. Satisfactory ET tube and enteric tube. 2.  No acute cardiopulmonary abnormality. Electronically Signed   By: Genevie Ann M.D.   On: 03/23/2021 06:05   DG Abd 1 View  Result Date: 03/23/2021 CLINICAL DATA:   Evaluate OG tube placement. EXAM: ABDOMEN - 1 VIEW COMPARISON:  None. FINDINGS: The OG tube courses below the level of the GE junction. The tip of the OG tube is in the expected location of the gastric fundus with side port well below the GE junction. Single borderline dilated loop of small bowel noted in the left lower quadrant of the abdomen with normal caliber colon. IMPRESSION: OG tube tip is in the gastric fundus with side port well below the GE junction. Electronically Signed   By: Kerby Moors M.D.   On: 03/23/2021 05:16   CT HEAD WO CONTRAST (5MM)  Result Date: 03/23/2021 CLINICAL DATA:  51 year old male status post cardiopulmonary arrest, out of hospital CPR. Altered  mental status. EXAM: CT HEAD WITHOUT CONTRAST TECHNIQUE: Contiguous axial images were obtained from the base of the skull through the vertex without intravenous contrast. RADIATION DOSE REDUCTION: This exam was performed according to the departmental dose-optimization program which includes automated exposure control, adjustment of the mA and/or kV according to patient size and/or use of iterative reconstruction technique. COMPARISON:  None. FINDINGS: Brain: Small round hypodensity at the genu of the left internal capsule on series 2, image 15. Elsewhere gray-white matter differentiation is preserved and within normal limits. However, there is questionable generalized loss of cerebral sulci. But basilar cisterns appear normal. Normal cerebral volume. No midline shift, ventriculomegaly, mass effect, evidence of mass lesion, intracranial hemorrhage or evidence of cortically based acute infarction. Vascular: Mild generalized intracranial vascular hyperdensity, perhaps related to hemoconcentration. No suspicious intracranial vascular hyperdensity. Skull: No acute osseous abnormality identified. Sinuses/Orbits: Intubated on the scout view. Fluid in the visible pharynx. Right mastoid effusion. Right tympanic cavity remains clear. Mild left  mastoid effusion. Paranasal sinuses remain well aerated. Other: No acute orbit or scalp soft tissue finding. IMPRESSION: 1. Difficult without a prior study to exclude the possibility of anoxic injury; questionable loss of cerebral sulci throughout the brain, but the gray-white matter differentiation is preserved. If decreased mental status persists then repeat Head CT in 12-24 hours or noncontrast brain MRI would be valuable. 2. 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 No intracranial hemorrhage or mass effect. 3. Intubated, with mastoid air cell effusions. Electronically Signed   By: Genevie Ann M.D.   On: 03/23/2021 06:04   MR BRAIN WO CONTRAST  Result Date: 03/24/2021 CLINICAL DATA:  Anoxic brain damage. EXAM: MRI HEAD WITHOUT CONTRAST TECHNIQUE: Multiplanar, multiecho pulse sequences of the brain and surrounding structures were obtained without intravenous contrast. COMPARISON:  Head CT March 23, 2021 FINDINGS: Brain: Cortical restricted diffusion along the bilateral ACA-MCA watershed, bilateral occipital lobes, posterior cingulate, bilateral caudate and lentiform nucleus, particularly posterior aspect of the putamen, consistent with hypoxic ischemic injury. Questionable restricted diffusion in the tail of left hippocampus. Punctate foci of restricted diffusion are also seen in the left cerebellar hemisphere, right posterior temporal lobe, medial right frontal lobe and left occipital lobe. Posterior fossa structures are otherwise maintained. No significant mass effect, hydrocephalus, hemorrhage, extra-axial collection or mass lesion. Remote lacunar infarct at the genu of the left internal capsule. A few scattered foci of T2 hyperintensity within the white matter of the cerebral hemispheres and periventricular white matter of the left temporal lobe, nonspecific. Vascular: Normal flow voids. Skull and upper cervical spine: No focal marrow lesion. Sinuses/Orbits: Fluid level within the right  maxillary sinus and bilateral mastoid effusion may be related to endotracheal tube. The orbits are maintained. Other: None. 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 Electronically Signed   By: Pedro Earls M.D.   On: 03/24/2021 13:53   CARDIAC CATHETERIZATION  Result Date: 03/23/2021   Dist RCA lesion is 20% stenosed.   There is mild left ventricular systolic dysfunction.   The left ventricular ejection fraction is 45-50% by visual estimate. 1.  Insignificant coronary artery disease 2.  Mild reduced left ventricular function with estimated LV ejection fraction 45 to 50% Recommendations 1.  Admit to ICU for ventilator management 2.  2D echocardiogram   DG Chest Port 1 View  Result Date: 03/26/2021 CLINICAL DATA:  Hypoxia. EXAM: PORTABLE CHEST 1 VIEW COMPARISON:  Chest XR, 03/25/2021. FINDINGS:  Support lines: ETT within the midthoracic trachea, 7 cm from carina. Esophageal temperature probe. Enteric feeding tube with tip and side port within stomach. Overlying cardiac leads. Cardiac silhouette is within normal limits given technique and degree of inflation. Hypoinflation. Bilateral perihilar and basilar streaky interstitial opacities. No pleural effusion or pneumothorax. No interval osseous abnormality. IMPRESSION: 1. Well-positioned ET tube, esophageal temperature probe and enteric feeding tube. 2. Hypoinflation with minimal bilateral atelectasis. Electronically Signed   By: Michaelle Birks M.D.   On: 03/26/2021 09:38   DG Chest Port 1 View  Result Date: 03/25/2021 CLINICAL DATA:  Acute respiratory failure with hypoxia. EXAM: PORTABLE CHEST 1 VIEW COMPARISON:  03/24/2021 FINDINGS: ET tube tip is above the carina. There is a NG tube with tip and side port below the GE junction. Temperature probe is noted with tip terminating in the midesophagus. Heart size normal. No pleural  effusion or edema. Lung volumes are low. No airspace opacities. IMPRESSION: 1. Support apparatus positioned as above. 2. No acute cardiopulmonary abnormalities noted. Electronically Signed   By: Kerby Moors M.D.   On: 03/25/2021 08:04   DG Chest Port 1 View  Result Date: 03/24/2021 CLINICAL DATA:  Acute respiratory failure, hypoxia EXAM: PORTABLE CHEST 1 VIEW COMPARISON:  Chest radiograph 1 day prior FINDINGS: The endotracheal tube tip is approximately 3.1 cm from the carina. The enteric catheter is coiled in the stomach. A presumed esophageal temperature probe terminates in the mid thorax. The cardiomediastinal silhouette is stable. Lung aeration is unchanged. There is no new or worsening focal airspace disease. There is no pleural effusion or pneumothorax. There is a mildly displaced fracture of the left fifth rib possibly related to CPR. IMPRESSION: Mildly displaced fracture of the left fifth rib which may be related to CPR. Otherwise, stable chest with no radiographic evidence of acute cardiopulmonary process. Electronically Signed   By: Valetta Mole M.D.   On: 03/24/2021 07:21   DG Chest Portable 1 View  Result Date: 03/23/2021 CLINICAL DATA:  Status post intubation. EXAM: PORTABLE CHEST 1 VIEW COMPARISON:  None. FINDINGS: Overlying radiopaque cardiac lead wires and tags are seen. An endotracheal tube is seen with its distal tip approximately 0.9 cm from the carina. Mildly decreased lung volumes are noted. Mild atelectasis and/or infiltrate is seen along the infrahilar region of the right lung base. There is no evidence of a pleural effusion or pneumothorax. The heart size and mediastinal contours are within normal limits. The visualized skeletal structures are unremarkable. IMPRESSION: 1. Endotracheal tube with its distal tip approximately 0.9 cm from the carina. 2. Mild right basilar atelectasis and/or infiltrate. Electronically Signed   By: Virgina Norfolk M.D.   On: 03/23/2021 03:30   EEG  adult  Result Date: 03/27/2021 Lora Havens, MD     03/27/2021  7:46 PM Patient Name: Billy Cherry MRN: [id number] Epilepsy Attending: Lora Havens Referring Physician/Provider: Derek Jack, MD Date: 03/27/2021 Duration: 22.26 mins Patient history: 51 year old gentleman with history of hypertension who is admitted after cardiac arrest. EEG to evaluate for seizure Level of alertness: lethargic AEDs during EEG study: LEV Technical aspects: This EEG study was done with scalp electrodes positioned according to the 10-20 International system of electrode placement. Electrical activity was acquired at a sampling rate of 500Hz  and reviewed with a high frequency filter of 70Hz  and a low frequency filter of 1Hz . EEG data were recorded continuously and digitally stored. Description: EEG showed near continuous generalized 3 to 5 Hz theta-delta slowing admixed  with 1-3 seconds of intermittent eeg attenuation. Generalized spikes were noted intermittently. Hyperventilation and photic stimulation were not performed.   ABNORMALITY - Spike,generalized - Continuous slow, generalized IMPRESSION: This study showed evidence of epileptogenicity with generalized onset. There is also moderate diffuse encephalopathy, nonspecific etiology. No seizures were seen throughout the recording. Lora Havens   EEG adult  Result Date: 03/23/2021 Derek Jack, MD     03/23/2021  7:35 PM Routine EEG Report Billy Cherry is a 51 y.o. male with a history of cardiac arrest who is undergoing an EEG to evaluate for seizures. Report: This EEG was acquired with electrodes placed according to the International 10-20 electrode system (including Fp1, Fp2, F3, F4, C3, C4, P3, P4, O1, O2, T3, T4, T5, T6, A1, A2, Fz, Cz, Pz). The following electrodes were missing or displaced: none. There was no clear occipital dominant rhythm. The best background was low amplitude theta which was reactive to stimulation. At the beginning of the  recording propofol had just been paused and there were intervening periods of diffuse suppression. Towards the end of the recording as propofol wore off background became near continuous. There was no focal slowing. There was no sleep architecture observed. There were no interictal epileptiform discharges. There were no electrographic seizures identified. Photic stimulation and hyperventilation were not performed. Impression and clinical correlation: This EEG was obtained while comatose and is abnormal due to: - Moderate diffuse slowing indicative of global cerebral dysfunction - Initial burst suppression pattern which abated as propofol wore off and is favored to be largely (or entirely) secondary to medication effect Su Monks, MD [facility] [phone] If 7pm- 7am, please page neurology on call as listed in [place].   ECHOCARDIOGRAM COMPLETE  Result Date: 03/23/2021    ECHOCARDIOGRAM REPORT   Patient Name:   Billy Cherry Date of Exam: 03/23/2021 Medical Rec #:  [id number]       Height:       71.0 in Accession #:    [id number]      Weight:       241.0 lb Date of Birth:  18-Aug-1970       BSA:          2.283 m Patient Age:    38 years        BP:           127/96 mmHg Patient Gender: M               HR:           105 bpm. Exam Location:  [facility] Procedure: 2D Echo, Color Doppler and Cardiac Doppler Indications:     I46.9 Cardiac arrest  History:         Patient has no prior history of Echocardiogram examinations.                  Risk Factors:Hypertension.  Sonographer:     Charmayne Sheer Referring Phys:  [place]ark Diagnosing Phys: Serafina Royals MD  Sonographer Comments: Echo performed with patient supine and on artificial respirator. IMPRESSIONS  1. Left ventricular ejection fraction, by estimation, is <20%. The left ventricle has severely decreased function. The left ventricle demonstrates global hypokinesis. The left ventricular internal cavity size was mildly dilated. There is mild  left ventricular hypertrophy. Left ventricular diastolic parameters are consistent with Grade II diastolic dysfunction (pseudonormalization).  2. Right ventricular systolic function is normal. The right ventricular size is normal.  3. The mitral valve is normal in structure.  Trivial mitral valve regurgitation.  4. The aortic valve is normal in structure. Aortic valve regurgitation is not visualized. FINDINGS  Left Ventricle: Left ventricular ejection fraction, by estimation, is <20%. The left ventricle has severely decreased function. The left ventricle demonstrates global hypokinesis. The left ventricular internal cavity size was mildly dilated. There is mild left ventricular hypertrophy. Left ventricular diastolic parameters are consistent with Grade II diastolic dysfunction (pseudonormalization). Right Ventricle: The right ventricular size is normal. No increase in right ventricular wall thickness. Right ventricular systolic function is normal. Left Atrium: Left atrial size was normal in size. Right Atrium: Right atrial size was normal in size. Pericardium: There is no evidence of pericardial effusion. Mitral Valve: The mitral valve is normal in structure. Trivial mitral valve regurgitation. MV peak gradient, 3.5 mmHg. The mean mitral valve gradient is 2.0 mmHg. Tricuspid Valve: The tricuspid valve is normal in structure. Tricuspid valve regurgitation is trivial. Aortic Valve: The aortic valve is normal in structure. Aortic valve regurgitation is not visualized. Aortic valve mean gradient measures 2.0 mmHg. Aortic valve peak gradient measures 3.7 mmHg. Aortic valve area, by VTI measures 3.03 cm. Pulmonic Valve: The pulmonic valve was normal in structure. Pulmonic valve regurgitation is not visualized. Aorta: The aortic root and ascending aorta are structurally normal, with no evidence of dilitation. IAS/Shunts: No atrial level shunt detected by color flow Doppler.  LEFT VENTRICLE PLAX 2D LVIDd:         5.81 cm       Diastology LVIDs:         5.17 cm      LV e' medial:    4.68 cm/s LV PW:         1.52 cm      LV E/e' medial:  7.6 LV IVS:        1.01 cm      LV e' lateral:   3.26 cm/s LVOT diam:     2.30 cm      LV E/e' lateral: 10.9 LV SV:         39 LV SV Index:   17 LVOT Area:     4.15 cm  LV Volumes (MOD) LV vol d, MOD A2C: 219.0 ml LV vol d, MOD A4C: 198.0 ml LV vol s, MOD A2C: 156.0 ml LV vol s, MOD A4C: 153.0 ml LV SV MOD A2C:     63.0 ml LV SV MOD A4C:     198.0 ml LV SV MOD BP:      56.7 ml RIGHT VENTRICLE RV Basal diam:  3.99 cm RV S prime:     15.40 cm/s LEFT ATRIUM             Index        RIGHT ATRIUM           Index LA diam:        4.30 cm 1.88 cm/m   RA Area:     15.20 cm LA Vol (A2C):   55.4 ml 24.27 ml/m  RA Volume:   34.90 ml  15.29 ml/m LA Vol (A4C):   48.2 ml 21.11 ml/m LA Biplane Vol: 52.1 ml 22.82 ml/m  AORTIC VALVE                    PULMONIC VALVE AV Area (Vmax):    2.63 cm     PV Vmax:          0.87 m/s AV Area (Vmean):   2.53 cm  PV Vmean:         63.300 cm/s AV Area (VTI):     3.03 cm     PV VTI:           0.127 m AV Vmax:           96.50 cm/s   PV Peak grad:     3.0 mmHg AV Vmean:          67.100 cm/s  PV Mean grad:     2.0 mmHg AV VTI:            0.127 m      PR End Diast Vel: 6.66 msec AV Peak Grad:      3.7 mmHg AV Mean Grad:      2.0 mmHg LVOT Vmax:         61.10 cm/s LVOT Vmean:        40.800 cm/s LVOT VTI:          0.093 m LVOT/AV VTI ratio: 0.73  AORTA Ao Root diam: 3.30 cm MITRAL VALVE MV Area (PHT): 7.99 cm    SHUNTS MV Area VTI:   2.25 cm    Systemic VTI:  0.09 m MV Peak grad:  3.5 mmHg    Systemic Diam: 2.30 cm MV Mean grad:  2.0 mmHg MV Vmax:       0.94 m/s MV Vmean:      67.2 cm/s MV Decel Time: 95 msec MV E velocity: 35.60 cm/s MV A velocity: 84.00 cm/s MV E/A ratio:  0.42 Serafina Royals MD Electronically signed by Serafina Royals MD Signature Date/Time: 03/23/2021/1:02:27 PM    Final     EKG: Sinus rhythm 85 bpm with nonspecific ST abnormalities  Telemetry reviewed by  me: NSR rate 82, no acute overnight events  Echocardiogram 03/23/2020  1. Left ventricular ejection fraction, by estimation, is <20%. The left  ventricle has severely decreased function. The left ventricle demonstrates  global hypokinesis. The left ventricular internal cavity size was mildly  dilated. There is mild left  ventricular hypertrophy. Left ventricular diastolic parameters are  consistent with Grade II diastolic dysfunction (pseudonormalization).   2. Right ventricular systolic function is normal. The right ventricular  size is normal.   3. The mitral valve is normal in structure. Trivial mitral valve  regurgitation.   4. The aortic valve is normal in structure. Aortic valve regurgitation is  not visualized.   ASSESSMENT AND PLAN:   #Cardiopulmonary arrest #acute HFrEF (LVEF <20%, global hypokinesis) -Further history from the patient's wife revealed the etiology of his presentation could be from lisinopril or losartan induced angioedema causing hypoxia and subsequent events that followed. -S/p Levophed gtt. discontinued on 1/20.  Blood pressure has been labile over the weekend requiring doses of IV labetalol.  Sedation with propofol discontinued 1/22. -GDMT includes Coreg 25 mg twice daily, hydralazine 25 mg every 8 hours and uptitrating imdur to 20mg  TID today for better BP control -No further cardiac diagnostics necessary at this time, consider repeat echocardiogram later in clinical course if necessary to assess for risk stratification.   -Neurology consulted by primary team, appreciate their recommendations -G tube planned with GI today.   #Respiratory failure #VAP -Continues to require ventilatory support, supportive care as per primary team  #Essential hypertension -Do not restart ACE inhibitor or ARB due to suspected angioedema. -Other plan as above  #Acute kidney injury with underlying mild CKD -Cr 2.08 and GFR 38 on admission, continuing to improve.   This  patient's  plan of care was discussed and created with Dr. Lujean Amel and he is in agreement.  Signed: Tristan Schroeder, PA-C 03/28/2021, 9:47 AM

## 2021-03-28 NOTE — Progress Notes (Signed)
NAME:  Billy Cherry, MRN:  [id number], DOB:  12/11/1970, LOS: 5 ADMISSION DATE:  03/23/2021, CONSULTATION DATE:  1/19 REFERRING MD:  Lorinda Creed, CHIEF COMPLAINT:  Found down, cardiac arrest   Brief Pt Description / Synopsis:  51 year old male with out-of-hospital V. fib cardiac arrest.  Cardiac catheterization without evidence of significant coronary artery disease. Now with concern for anoxic brain injury.  History of Present Illness:  51 y/o male was found down by his wife this evening with agonal respirations.  911 called, received out of hospital CPR, 1 shock with defibrillator, 2 doses of epinephrine.  Had return of spontaneous circulation.  Concern on his 12 lead EKG for anterolateral ST elevation so he was taken to the cath lab where his study showed no significant coronary artery disease and an LVEF 45-50%.   He remained encephalopathic post cardiac arrest so PCCM was consulted.    Family notes he rarely drinks alcohol, doesn't smoke, doesn't use illicit drugs.  Takes blood pressure medicine at home, may have run out recently.   Pertinent  Medical History  Hypertension  Micro Data:  1/19 SARS CoV2/Flu > negative  Antimicrobials:  Ceftriaxone 1/21>>  Significant Hospital Events: Including procedures, antibiotic start and stop dates in addition to other pertinent events   1/19 admission, left heart cath without significant CAD.  Concern for anoxic brain injury 1/20: MRI pending, Neuro following 1/21: MRI shows areas of hypoxic-ischemic injury bilaterally. Fevered; cultures obtained. 1/23 severe brain damage 1/24 evidence of severe brain damage, remains critically ill  Interim History / Subjective:  +aspiration pneumonia  +severe brain damage Severe resp failure Remains on vent   Objective   Blood pressure (!) 154/98, pulse 94, temperature 99.5 F (37.5 C), resp. rate 20, height 5' 10.98" (1.803 m), weight 102.2 kg, SpO2 100 %.    Vent Mode: PRVC FiO2 (%):  [30 %] 30  % Set Rate:  [20 bmp] 20 bmp Vt Set:  [500 mL] 500 mL PEEP:  [5 cmH20] 5 cmH20   Intake/Output Summary (Last 24 hours) at 03/28/2021 0747 Last data filed at 03/28/2021 0600 Gross per 24 hour  Intake 905.92 ml  Output 2400 ml  Net -1494.08 ml    Filed Weights   03/26/21 0500 03/27/21 0500 03/28/21 0500  Weight: 110.5 kg 108.2 kg 102.2 kg    REVIEW OF SYSTEMS  PATIENT IS UNABLE TO PROVIDE COMPLETE REVIEW OF SYSTEMS DUE TO SEVERE CRITICAL ILLNESS AND TOXIC METABOLIC ENCEPHALOPATHY    PHYSICAL EXAMINATION:  GENERAL:critically ill appearing, +resp distress EYES: Pupils equal, round, reactive to light.  No scleral icterus.  MOUTH: Moist mucosal membrane. INTUBATED NECK: Supple.  PULMONARY: +rhonchi, +wheezing CARDIOVASCULAR: S1 and S2.  No murmurs  GASTROINTESTINAL: Soft, nontender, -distended. Positive bowel sounds.  MUSCULOSKELETAL: No swelling, clubbing, or edema.  NEUROLOGIC: obtunded SKIN:intact,warm,dry   Resolved Hospital Problem list   Hypertensive emergency Lactic acidosis Elevated LFTs  Assessment & Plan:   51 yo AAM with severe V. Fib arrest, left heart cath without significant coronary artery disease + angioedema from ACE inhibitor leading to asphyxiation and acute closure of upper airway leading to  severe hypoxic respiratory failure and now with severe brain damage   Severe ACUTE Hypoxic and Hypercapnic Respiratory Failure -continue Mechanical Ventilator support -Wean Fio2 and PEEP as tolerated -VAP/VENT bundle implementation - Wean PEEP & FiO2 as tolerated, maintain SpO2 > 88% - Head of bed elevated 30 degrees, VAP protocol in place - Plateau pressures less than 30 cm H20  - Intermittent  chest x-ray & ABG PRN - Ensure adequate pulmonary hygiene  -will NOT perform SAT/SBT when respiratory parameters are met Will need TRACH/PEG tube for survival   Sepsis, unclear etiology Aspiration pneumonia is suspected -Continue ceftriaxone -Follow up culture  data -Trend fever curve, WBC count   NEUROLOGY ACUTE TOXIC METABOLIC ENCEPHALOPATHY Severe BRAIN DAMAGE MRI c/w HIE Follow up neuro recs  ACUTE KIDNEY INJURY/Renal Failure -continue Foley Catheter-assess need -Avoid nephrotoxic agents -Follow urine output, BMP -Ensure adequate renal perfusion, optimize oxygenation -Renal dose medications   Intake/Output Summary (Last 24 hours) at 03/28/2021 0749 Last data filed at 03/28/2021 2703 Gross per 24 hour  Intake 1005.92 ml  Output 2400 ml  Net -1394.08 ml     ENDO - ICU hypoglycemic\Hyperglycemia protocol -check FSBS per protocol   GI GI PROPHYLAXIS as indicated  NUTRITIONAL STATUS DIET-->TF's as tolerated Constipation protocol as indicated   ELECTROLYTES -follow labs as needed -replace as needed -pharmacy consultation and following    Best Practice (right click and "Reselect all SmartList Selections" daily)   Diet/type: tubefeeds DVT prophylaxis: LMWH GI prophylaxis: PPI Lines: N/A Foley:  Yes, and it is still needed Code Status:  full code   Labs   CBC: Recent Labs  Lab 03/23/21 0254 03/24/21 0513 03/25/21 0441 03/26/21 0534 03/27/21 0321 03/28/21 0538  WBC 12.6* 15.3* 20.7* 21.2* 15.9* 13.3*  NEUTROABS 5.8  --   --   --   --   --   HGB 14.8 15.7 14.4 13.5 12.6* 12.3*  HCT 45.2 47.9 43.7 41.6 39.7 38.6*  MCV 93.2 91.9 91.8 92.7 93.2 93.7  PLT 228 193 201 199 214 245     Basic Metabolic Panel: Recent Labs  Lab 03/23/21 1723 03/24/21 0513 03/24/21 1700 03/25/21 0441 03/26/21 0534 03/27/21 0321 03/28/21 0538  NA  --  139  --  140 142 148* 151*  K  --  3.6  --  3.9 3.9 4.0 4.2  CL  --  105  --  104 110 115* 116*  CO2  --  26  --  $R'24 24 25 26  'Gf$ GLUCOSE  --  95  --  118* 154* 129* 125*  BUN  --  38*  --  58* 65* 74* 78*  CREATININE  --  1.91*  --  2.53* 1.87* 1.74* 1.90*  CALCIUM  --  8.4*  --  8.5* 8.7* 8.8* 9.0  MG 2.4 2.3 2.2  --   --  2.9* 2.8*  PHOS 3.0 4.2 5.0*  --   --  4.5 4.0     GFR: Estimated Creatinine Clearance: 56.6 mL/min (A) (by C-G formula based on SCr of 1.9 mg/dL (H)). Recent Labs  Lab 03/25/21 0441 03/26/21 0534 03/27/21 0321 03/28/21 0538  WBC 20.7* 21.2* 15.9* 13.3*     Liver Function Tests:  ABG    Component Value Date/Time   PHART 7.43 03/24/2021 0500   PCO2ART 36 03/24/2021 0500   PO2ART 111 (H) 03/24/2021 0500   HCO3 23.9 03/24/2021 0500   ACIDBASEDEF 0.3 03/23/2021 0556   O2SAT 98.5 03/24/2021 0500     Coagulation Profile: Recent Labs  Lab 03/23/21 0254  INR 1.0     Cardiac Enzymes: No results for input(s): CKTOTAL, CKMB, CKMBINDEX, TROPONINI in the last 168 hours.  HbA1C: Hgb A1c MFr Bld  Date/Time Value Ref Range Status  03/23/2021 06:23 AM 5.4 4.8 - 5.6 % Final    Comment:    (NOTE) Pre diabetes:  5.7%-6.4%  Diabetes:              >6.4%  Glycemic control for   <7.0% adults with diabetes   03/23/2021 02:54 AM 5.5 4.8 - 5.6 % Final    Comment:    DUPLICATE AT 7366 81/59/4707. THE NEXT SAMPLE WAS CREDITED. (NOTE) Pre diabetes:          5.7%-6.4%  Diabetes:              >6.4%  Glycemic control for   <7.0% adults with diabetes     CBG: Recent Labs  Lab 03/26/21 0725 03/26/21 1118 03/26/21 1554 03/27/21 1143 03/27/21 1539  GLUCAP 131* 126* 145* 108* 130*   Allergies Allergies  Allergen Reactions   Lisinopril Swelling    angioedema     Home Medications  Prior to Admission medications   Medication Sig Start Date End Date Taking? Authorizing Provider  lisinopril (PRINIVIL,ZESTRIL) 10 MG tablet Take 10 mg by mouth daily.  11/12/16   [provider]  lisinopril (ZESTRIL) 10 MG tablet Take 1 tablet (10 mg total) by mouth daily. 12/06/19 12/05/20  Johnn Hai, PA-C       DVT/GI PRX  assessed I Assessed the need for Labs I Assessed the need for Foley I Assessed the need for Central Venous Line Family Discussion when available I Assessed the need for Mobilization I  made an Assessment of medications to be adjusted accordingly Safety Risk assessment completed  CASE DISCUSSED IN MULTIDISCIPLINARY ROUNDS WITH ICU TEAM  PATIENT WILL NEED TRACH AND PEG FOR SURVIVAL  PROGNOSIS IS POOR   Critical Care Time devoted to patient care services described in this note is 55 minutes.  Critical care was necessary to treat /prevent imminent and life-threatening deterioration. Overall, patient is critically ill, prognosis is guarded.  Patient with Multiorgan failure and at high risk for cardiac arrest and death.    Corrin Parker, M.D.  [facility]  Medical Director [place] Director [facility] Cardio-Pulmonary Department

## 2021-03-29 ENCOUNTER — Encounter: Payer: Self-pay | Admitting: Anesthesiology

## 2021-03-29 ENCOUNTER — Encounter
Admission: EM | Disposition: A | Discharge status: Other Acute Inpatient Hospital | Payer: Self-pay | Source: Home / Self Care | Attending: Cardiology

## 2021-03-29 ENCOUNTER — Inpatient Hospital Stay: Payer: BC Managed Care – PPO

## 2021-03-29 LAB — PHOSPHORUS: Phosphorus: 3.9 mg/dL (ref 2.5–4.6)

## 2021-03-29 LAB — GLUCOSE, CAPILLARY
Glucose-Capillary: 98 mg/dL (ref 70–99)
Glucose-Capillary: 129 mg/dL — ABNORMAL HIGH (ref 70–99)
Glucose-Capillary: 132 mg/dL — ABNORMAL HIGH (ref 70–99)
Glucose-Capillary: 141 mg/dL — ABNORMAL HIGH (ref 70–99)
Glucose-Capillary: 130 mg/dL — ABNORMAL HIGH (ref 70–99)
Glucose-Capillary: 96 mg/dL (ref 70–99)

## 2021-03-29 LAB — CBC
HCT: 39.9 % (ref 39.0–52.0)
Hemoglobin: 12.5 g/dL — ABNORMAL LOW (ref 13.0–17.0)
MCH: 29.9 pg (ref 26.0–34.0)
MCHC: 31.3 g/dL (ref 30.0–36.0)
MCV: 95.5 fL (ref 80.0–100.0)
Platelets: 254 10*3/uL (ref 150–400)
RBC: 4.18 MIL/uL — ABNORMAL LOW (ref 4.22–5.81)
RDW: 14.1 % (ref 11.5–15.5)
WBC: 13.6 10*3/uL — ABNORMAL HIGH (ref 4.0–10.5)
nRBC: 0 % (ref 0.0–0.2)

## 2021-03-29 LAB — BASIC METABOLIC PANEL
Anion gap: 9 (ref 5–15)
BUN: 60 mg/dL — ABNORMAL HIGH (ref 6–20)
CO2: 26 mmol/L (ref 22–32)
Calcium: 9.2 mg/dL (ref 8.9–10.3)
Chloride: 115 mmol/L — ABNORMAL HIGH (ref 98–111)
Creatinine, Ser: 1.68 mg/dL — ABNORMAL HIGH (ref 0.61–1.24)
GFR, Estimated: 49 mL/min — ABNORMAL LOW (ref >60)
Glucose, Bld: 144 mg/dL — ABNORMAL HIGH (ref 70–99)
Potassium: 3.8 mmol/L (ref 3.5–5.1)
Sodium: 150 mmol/L — ABNORMAL HIGH (ref 135–145)

## 2021-03-29 LAB — MAGNESIUM: Magnesium: 2.4 mg/dL (ref 1.7–2.4)

## 2021-03-29 LAB — AMMONIA: Ammonia: 38 umol/L — ABNORMAL HIGH (ref 9–35)

## 2021-03-29 SURGERY — INSERTION, PEG TUBE
Anesthesia: General

## 2021-03-29 MED ORDER — SODIUM CHLORIDE 0.9 % IV SOLN
750.0000 mg | Freq: Two times a day (BID) | INTRAVENOUS | Status: DC
  Administered 2021-03-29 – 2021-03-30 (×2): 750 mg via INTRAVENOUS
  Filled 2021-03-29 (×4): qty 7.5

## 2021-03-29 MED ORDER — VALPROIC ACID 250 MG/5ML PO SOLN
500.0000 mg | Freq: Three times a day (TID) | ORAL | Status: DC
  Administered 2021-03-29 – 2021-03-30 (×3): 500 mg
  Filled 2021-03-29 (×5): qty 10

## 2021-03-29 MED ORDER — CLEVIDIPINE BUTYRATE 0.5 MG/ML IV EMUL
0.0000 mg/h | INTRAVENOUS | Status: DC
  Administered 2021-03-29: 09:00:00 1 mg/h via INTRAVENOUS
  Administered 2021-03-29: 13:00:00 7 mg/h via INTRAVENOUS
  Filled 2021-03-29 (×3): qty 50

## 2021-03-29 MED ORDER — SODIUM CHLORIDE 0.9 % IV SOLN
2000.0000 mg | Freq: Once | INTRAVENOUS | Status: AC
  Administered 2021-03-29: 10:00:00 2000 mg via INTRAVENOUS
  Filled 2021-03-29: qty 20

## 2021-03-29 MED ORDER — ISOSORBIDE DINITRATE 20 MG PO TABS
20.0000 mg | ORAL_TABLET | Freq: Three times a day (TID) | ORAL | Status: DC
  Administered 2021-03-29 – 2021-03-30 (×3): 20 mg
  Filled 2021-03-29 (×6): qty 1

## 2021-03-29 MED ORDER — VALPROATE SODIUM 100 MG/ML IV SOLN
2000.0000 mg | Freq: Once | INTRAVENOUS | Status: AC
  Administered 2021-03-29: 14:00:00 2000 mg via INTRAVENOUS
  Filled 2021-03-29: qty 20

## 2021-03-29 NOTE — Procedures (Addendum)
This patient Name: Billy Cherry  MRN: [id number]  Epilepsy Attending: Charlsie Quest  Referring Physician/Provider: Judithe Modest, NP Date: 03/29/2021 Duration: 31.59 mins  Patient history: 51 year old male status post cardiac arrest now noted to have eye twitching/remor like movements and rhythmic lip & tongue fasciculations.  EEG evaluate for seizure.  Level of alertness: comatose  AEDs during EEG study: LEV   Technical aspects: This EEG study was done with scalp electrodes positioned according to the 10-20 International system of electrode placement. Electrical activity was acquired at a sampling rate of 500Hz  and reviewed with a high frequency filter of 70Hz  and a low frequency filter of 1Hz . EEG data were recorded continuously and digitally stored.   Description: EEG showed continuous generalized 5 to 7 Hz theta slowing.  Generalized periodic discharges were also noted at 1.5 to 2 Hz.  Patient was noted to have episodes of eye opening and lip twitching during the study, more prominent when stimulated. Concomitant EEG showed generalized spikes consistent with myoclonic seizure.   Hyperventilation and photic stimulation were not performed.     ABNORMALITY -Myoclonic seizure, generalized -Continuous slow, generalized  IMPRESSION: This study showed episodes of eye opening and lip twitching, more prominent when stimulated consistent with myoclonic seizures.  Additionally EEG was suggestive of severe diffuse encephalopathy.  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  Critical care team and Dr. was notified.  Billy Cherry 

## 2021-03-29 NOTE — Progress Notes (Addendum)
Subjective: Received Dilaudid at 2109 yesterday night.   Objective: Current vital signs: BP (!) 174/110    Pulse 85    Temp 99.5 F (37.5 C)    Resp 15    Ht 5' 10.98" (1.803 m)    Wt 102 kg    SpO2 100%    BMI 31.38 kg/m  Vital signs in last 24 hours: Temp:  [98.6 F (37 C)-100.8 F (38.2 C)] 99.5 F (37.5 C) (01/25 0915) Pulse Rate:  [68-102] 85 (01/25 0915) Resp:  [7-33] 15 (01/25 0915) BP: (117-191)/(81-119) 174/110 (01/25 0915) SpO2:  [93 %-100 %] 100 % (01/25 0915) FiO2 (%):  [30 %] 30 % (01/25 0805) Weight:  [102 kg] 102 kg (01/25 0500)  Intake/Output from previous day: 01/24 0701 - 01/25 0700 In: 2076.1 [I.V.:353.5; NG/GT:1494.1; IV Piggyback:228.5] Out: 3000 [Urine:3000] Intake/Output this shift: Total I/O In: 1402.9 [P.O.:480; I.V.:751.6; IV Piggyback:171.4] Out: 725 [Urine:200; Stool:525] Nutritional status:  Diet Order             Diet NPO time specified  Diet effective now                   HEENT: [place]/AT Lungs: Intubated Ext: Warm and well perfused   Neurologic Exam:  Ment: Eyes are noted to be open when examiner enters the room. Blinks at a faster rate than nomrla. No response to loud clapping. Spontaneously chews on ETT. CN: Pupils 2-3 mm with sluggish reactivity bilaterally. No blink to threat. Eyes slightly dysconjugate. Weak doll's eye reflex. Weak corneal reflexes. Face flaccidly symmetric.   Motor/Sensory: BUE with flaccid tone and no movement to noxious.  BLE with flaccid tone. No movement to noxious.  Reflexes: Hypoactive x 4 Cerebellar/Gait: Unable to test.   Lab Results: Results for orders placed or performed during the hospital encounter of 03/23/21 (from the past 48 hour(s))  Glucose, capillary     Status: Abnormal   Collection Time: 03/27/21 11:43 AM  Result Value Ref Range   Glucose-Capillary 108 (H) 70 - 99 mg/dL    Comment: Glucose reference range applies only to samples taken after fasting for at least 8 hours.  Glucose,  capillary     Status: Abnormal   Collection Time: 03/27/21  3:39 PM  Result Value Ref Range   Glucose-Capillary 130 (H) 70 - 99 mg/dL    Comment: Glucose reference range applies only to samples taken after fasting for at least 8 hours.  Glucose, capillary     Status: Abnormal   Collection Time: 03/27/21  7:43 PM  Result Value Ref Range   Glucose-Capillary 112 (H) 70 - 99 mg/dL    Comment: Glucose reference range applies only to samples taken after fasting for at least 8 hours.   Comment 1 Notify RN    Comment 2 Document in Chart   Glucose, capillary     Status: Abnormal   Collection Time: 03/27/21 11:22 PM  Result Value Ref Range   Glucose-Capillary 119 (H) 70 - 99 mg/dL    Comment: Glucose reference range applies only to samples taken after fasting for at least 8 hours.   Comment 1 Notify RN    Comment 2 Document in Chart   Glucose, capillary     Status: Abnormal   Collection Time: 03/28/21  4:00 AM  Result Value Ref Range   Glucose-Capillary 114 (H) 70 - 99 mg/dL    Comment: Glucose reference range applies only to samples taken after fasting for at least 8 hours.  CBC     Status: Abnormal   Collection Time: 03/28/21  5:38 AM  Result Value Ref Range   WBC 13.3 (H) 4.0 - 10.5 K/uL   RBC 4.12 (L) 4.22 - 5.81 MIL/uL   Hemoglobin 12.3 (L) 13.0 - 17.0 g/dL   HCT 38.6 (L) 39.0 - 52.0 %   MCV 93.7 80.0 - 100.0 fL   MCH 29.9 26.0 - 34.0 pg   MCHC 31.9 30.0 - 36.0 g/dL   RDW 14.5 11.5 - 15.5 %   Platelets 245 150 - 400 K/uL   nRBC 0.0 0.0 - 0.2 %    Comment: Performed at [facility], [address], [place], [place] [address]  Basic metabolic panel     Status: Abnormal   Collection Time: 03/28/21  5:38 AM  Result Value Ref Range   Sodium 151 (H) 135 - 145 mmol/L   Potassium 4.2 3.5 - 5.1 mmol/L   Chloride 116 (H) 98 - 111 mmol/L   CO2 26 22 - 32 mmol/L   Glucose, Bld 125 (H) 70 - 99 mg/dL    Comment: Glucose reference range applies only to samples taken after fasting  for at least 8 hours.   BUN 78 (H) 6 - 20 mg/dL   Creatinine, Ser 1.90 (H) 0.61 - 1.24 mg/dL   Calcium 9.0 8.9 - 10.3 mg/dL   GFR, Estimated 42 (L) >60 mL/min    Comment: (NOTE) Calculated using the CKD-EPI Creatinine Equation (2021)    Anion gap 9 5 - 15    Comment: Performed at [facility], [address], [place], [place] [address]  Magnesium     Status: Abnormal   Collection Time: 03/28/21  5:38 AM  Result Value Ref Range   Magnesium 2.8 (H) 1.7 - 2.4 mg/dL    Comment: Performed at [facility], [address] [place], [place] [address]  Phosphorus     Status: None   Collection Time: 03/28/21  5:38 AM  Result Value Ref Range   Phosphorus 4.0 2.5 - 4.6 mg/dL    Comment: Performed at [facility], [address] [place], [place] [address]  Glucose, capillary     Status: Abnormal   Collection Time: 03/28/21  7:20 AM  Result Value Ref Range   Glucose-Capillary 133 (H) 70 - 99 mg/dL    Comment: Glucose reference range applies only to samples taken after fasting for at least 8 hours.  Glucose, capillary     Status: Abnormal   Collection Time: 03/28/21 11:25 AM  Result Value Ref Range   Glucose-Capillary 143 (H) 70 - 99 mg/dL    Comment: Glucose reference range applies only to samples taken after fasting for at least 8 hours.  Glucose, capillary     Status: Abnormal   Collection Time: 03/28/21  4:14 PM  Result Value Ref Range   Glucose-Capillary 134 (H) 70 - 99 mg/dL    Comment: Glucose reference range applies only to samples taken after fasting for at least 8 hours.  Glucose, capillary     Status: Abnormal   Collection Time: 03/28/21  8:06 PM  Result Value Ref Range   Glucose-Capillary 113 (H) 70 - 99 mg/dL    Comment: Glucose reference range applies only to samples taken after fasting for at least 8 hours.  Glucose, capillary     Status: None   Collection Time: 03/29/21  4:11 AM  Result Value Ref Range   Glucose-Capillary 98 70 - 99 mg/dL  Comment: Glucose reference range applies only to samples taken after fasting for at least 8 hours.  CBC     Status: Abnormal   Collection Time: 03/29/21  6:15 AM  Result Value Ref Range   WBC 13.6 (H) 4.0 - 10.5 K/uL   RBC 4.18 (L) 4.22 - 5.81 MIL/uL   Hemoglobin 12.5 (L) 13.0 - 17.0 g/dL   HCT 39.9 39.0 - 52.0 %   MCV 95.5 80.0 - 100.0 fL   MCH 29.9 26.0 - 34.0 pg   MCHC 31.3 30.0 - 36.0 g/dL   RDW 14.1 11.5 - 15.5 %   Platelets 254 150 - 400 K/uL   nRBC 0.0 0.0 - 0.2 %    Comment: Performed at [facility], [address] [place], [place] [address]  Basic metabolic panel     Status: Abnormal   Collection Time: 03/29/21  6:15 AM  Result Value Ref Range   Sodium 150 (H) 135 - 145 mmol/L   Potassium 3.8 3.5 - 5.1 mmol/L   Chloride 115 (H) 98 - 111 mmol/L   CO2 26 22 - 32 mmol/L   Glucose, Bld 144 (H) 70 - 99 mg/dL    Comment: Glucose reference range applies only to samples taken after fasting for at least 8 hours.   BUN 60 (H) 6 - 20 mg/dL   Creatinine, Ser 1.68 (H) 0.61 - 1.24 mg/dL   Calcium 9.2 8.9 - 10.3 mg/dL   GFR, Estimated 49 (L) >60 mL/min    Comment: (NOTE) Calculated using the CKD-EPI Creatinine Equation (2021)    Anion gap 9 5 - 15    Comment: Performed at [facility], [address] [place], [place] [address]  Magnesium     Status: None   Collection Time: 03/29/21  6:15 AM  Result Value Ref Range   Magnesium 2.4 1.7 - 2.4 mg/dL    Comment: Performed at [facility], [address] [place], [place] [address]  Phosphorus     Status: None   Collection Time: 03/29/21  6:15 AM  Result Value Ref Range   Phosphorus 3.9 2.5 - 4.6 mg/dL    Comment: Performed at [facility], [address] [place],  [address]  Glucose, capillary     Status: Abnormal   Collection Time: 03/29/21  7:21 AM  Result Value Ref Range   Glucose-Capillary 129 (H) 70 - 99 mg/dL    Comment: Glucose reference range applies only to samples taken  after fasting for at least 8 hours.    Recent Results (from the past 240 hour(s))  Resp Panel by RT-PCR (Flu A&B, Covid) Nasopharyngeal Swab     Status: None   Collection Time: 03/23/21  2:54 AM   Specimen: Nasopharyngeal Swab; Nasopharyngeal(NP) swabs in vial transport medium  Result Value Ref Range Status   SARS Coronavirus 2 by RT PCR NEGATIVE NEGATIVE Final    Comment: (NOTE) SARS-CoV-2 target nucleic acids are NOT DETECTED.  The SARS-CoV-2 RNA is generally detectable in upper respiratory specimens during the acute phase of infection. The lowest concentration of SARS-CoV-2 viral copies this assay can detect is 138 copies/mL. A negative result does not preclude SARS-Cov-2 infection and should not be used as the sole basis for treatment or other patient management decisions. A negative result may occur with  improper specimen collection/handling, submission of specimen other than nasopharyngeal swab, presence of viral mutation(s) within the areas targeted by this assay, and inadequate number of viral copies(<138 copies/mL). A  negative result must be combined with clinical observations, patient history, and epidemiological information. The expected result is Negative.  Fact Sheet for Patients:  EntrepreneurPulse.com.au  Fact Sheet for Healthcare Providers:  IncredibleEmployment.be  This test is no t yet approved or cleared by the [place] FDA and  has been authorized for detection and/or diagnosis of SARS-CoV-2 by FDA under an Emergency Use Authorization (EUA). This EUA will remain  in effect (meaning this test can be used) for the duration of the COVID-19 declaration under Section 564(b)(1) of the Act, 21 U.S.C.section 360bbb-3(b)(1), unless the authorization is terminated  or revoked sooner.       Influenza A by PCR NEGATIVE NEGATIVE Final   Influenza B by PCR NEGATIVE NEGATIVE Final    Comment: (NOTE) The Xpert Xpress  SARS-CoV-2/FLU/RSV plus assay is intended as an aid in the diagnosis of influenza from Nasopharyngeal swab specimens and should not be used as a sole basis for treatment. Nasal washings and aspirates are unacceptable for Xpert Xpress SARS-CoV-2/FLU/RSV testing.  Fact Sheet for Patients: EntrepreneurPulse.com.au  Fact Sheet for Healthcare Providers: IncredibleEmployment.be  This test is not yet approved or cleared by the [place] FDA and has been authorized for detection and/or diagnosis of SARS-CoV-2 by FDA under an Emergency Use Authorization (EUA). This EUA will remain in effect (meaning this test can be used) for the duration of the COVID-19 declaration under Section 564(b)(1) of the Act, 21 U.S.C. section 360bbb-3(b)(1), unless the authorization is terminated or revoked.  Performed at [facility], [address] [place], [place] [address]   MRSA Next Gen by PCR, Nasal     Status: None   Collection Time: 03/23/21  4:20 AM   Specimen: Nasal Mucosa; Nasal Swab  Result Value Ref Range Status   MRSA by PCR Next Gen NOT DETECTED NOT DETECTED Final    Comment: (NOTE) The GeneXpert MRSA Assay (FDA approved for NASAL specimens only), is one component of a comprehensive MRSA colonization surveillance program. It is not intended to diagnose MRSA infection nor to guide or monitor treatment for MRSA infections. Test performance is not FDA approved in patients less than 77 years old. Performed at [facility], [address] [place], [place] [address]   Culture, Respiratory w Gram Stain     Status: None   Collection Time: 03/25/21  3:37 PM   Specimen: Tracheal Aspirate; Respiratory  Result Value Ref Range Status   Specimen Description   Final    TRACHEAL ASPIRATE Performed at [facility], [address], [place], [place] [address]    Special Requests   Final    NONE Performed at [facility], [address] [place], [place] [address]    Gram Stain   Final    MODERATE WBC PRESENT,BOTH PMN AND MONONUCLEAR MODERATE GRAM POSITIVE RODS FEW GRAM POSITIVE COCCI RARE GRAM NEGATIVE RODS    Culture   Final    Normal respiratory flora-no Staph aureus or Pseudomonas seen Performed at [facility], [address], [place], [place] [address]    Report Status 03/28/2021 FINAL  Final  CULTURE, BLOOD (ROUTINE X 2) w Reflex to ID Panel     Status: None (Preliminary result)   Collection Time: 03/25/21  5:24 PM   Specimen: BLOOD  Result Value Ref Range Status   Specimen Description BLOOD BLOOD RIGHT HAND  Final   Special Requests   Final    BOTTLES DRAWN AEROBIC AND ANAEROBIC Blood Culture adequate volume  Culture   Final    NO GROWTH 3 DAYS Performed at [facility], [place] [place], [place] [address]    Report Status PENDING  Incomplete  CULTURE, BLOOD (ROUTINE X 2) w Reflex to ID Panel     Status: None (Preliminary result)   Collection Time: 03/25/21  5:32 PM   Specimen: BLOOD  Result Value Ref Range Status   Specimen Description BLOOD BLOOD LEFT HAND  Final   Special Requests   Final    BOTTLES DRAWN AEROBIC AND ANAEROBIC Blood Culture adequate volume   Culture   Final    NO GROWTH 3 DAYS Performed at [facility], [address], [place], [place] [address]    Report Status PENDING  Incomplete    Lipid Panel No results for input(s): CHOL, TRIG, HDL, CHOLHDL, VLDL, LDLCALC in the last 72 hours.  Studies/Results: EEG adult  Result Date: 03/27/2021 Lora Havens, MD     03/27/2021  7:46 PM Patient Name: Billy Cherry MRN: [id number] Epilepsy Attending: Lora Havens Referring Physician/Provider: Derek Jack, MD Date: 03/27/2021 Duration: 22.26 mins Patient history: 51 year old gentleman with history of hypertension who is admitted after cardiac arrest. EEG to evaluate for seizure Level of alertness: lethargic AEDs during EEG study: LEV  Technical aspects: This EEG study was done with scalp electrodes positioned according to the 10-20 International system of electrode placement. Electrical activity was acquired at a sampling rate of _0  and reviewed with a high frequency filter of _1  and a low frequency filter of _2 . EEG data were recorded continuously and digitally stored. Description: EEG showed near continuous generalized 3 to 5 Hz theta-delta slowing admixed with 1-3 seconds of intermittent eeg attenuation. Generalized spikes were noted intermittently. Hyperventilation and photic stimulation were not performed.   ABNORMALITY - Spike,generalized - Continuous slow, generalized IMPRESSION: This study showed evidence of epileptogenicity with generalized onset. There is also moderate diffuse encephalopathy, nonspecific etiology. No seizures were seen throughout the recording. Priyanka Barbra Sarks    Medications: Scheduled:  carvedilol  25 mg Per Tube BID WC   chlorhexidine gluconate (MEDLINE KIT)  15 mL Mouth Rinse BID   Chlorhexidine Gluconate Cloth  6 each Topical Q0600   enoxaparin (LOVENOX) injection  0.5 mg/kg Subcutaneous Q24H   feeding supplement (PROSource TF)  45 mL Per Tube BID   free water  100 mL Per Tube Q4H   hydrALAZINE  25 mg Per Tube Q8H   insulin aspart  0-15 Units Subcutaneous Q4H   isosorbide dinitrate  10 mg Per Tube TID   mouth rinse  15 mL Mouth Rinse 10 times per day   pantoprazole sodium  40 mg Per Tube Daily   sodium chloride flush  3 mL Intravenous Q12H   Continuous:  sodium chloride     sodium chloride Stopped (03/23/21 0445)   cefTRIAXone (ROCEPHIN)  IV Stopped (03/28/21 1824)   clevidipine 2 mg/hr (03/29/21 0922)   dextrose 75 mL/hr at 03/29/21 0950   feeding supplement (VITAL HIGH PROTEIN) 65 mL/hr at 03/28/21 1803   levETIRAcetam 2,000 mg (03/29/21 0950)   levETIRAcetam      Assessment: 51 year old male with history of hypertension who is admitted after cardiac arrest 03/22/21.  Etiology of  arrest is unclear, given insignificant CAD on cath. Cardiology suspects this was 2/2 primary VF event vs possible angioedema 2/2 his lisinopril. He has been noted to have severely depressed EF (not present on left ventriculography during cath) and has completed heparin gtt  for NSTEMI. He has intact brainstem reflexes on exam, but does not follow commands or have any volitional motor response in his extremities to pain. MRI brain wo contrast does show e/o anoxic brain injury.  - Has new bilateral eyelid tremor-like movements as well as lip and jaw tremor-like versus clonus movements on repeat neurological exam today. Keppra 2000 mg was bolused and scheduled dose increased to 750 mg BID.  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 - EEG 1/19 did not show any epileptiform abnl. - Repeat EEG Monday 1/23: This study showed evidence of epileptogenicity with generalized onset. There is also moderate diffuse encephalopathy, nonspecific to etiology. No seizures were seen throughout the recording - Formal prognostication exam has been delayed 2/2 recent sedation and new infection (VAP).   Recommendations: - Continue Keppra at increased dose of 750 mg bid - Repeat EEG today.  - Plan for repeat neurologic exam after patient has been off continuous sedation for > 72 hrs, which should be on Thursday. Did receive a dose of Dilaudid last night, but this is not enough to have a significant lingering effect to impair prognostic exam tomorrow.   35 minutes spent in the neurological evaluation and management of this critically ill patient.   Addendum: - EEG reveals abnormalities of generalized myoclonic seizure activity on a background of continuous generalized slowing. There are episodes of eye opening and lip twitching, more prominent when stimulated, that are consistent with myoclonic seizures.   Additionally EEG was suggestive of severe diffuse encephalopathy.  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 - LFTs were elevated initially during this admission, then normalized as of 1/22 with AST of 31 and ALT of 44. No ammonia level currently.  - Adding VPA to his regimen with a 20 mg/kg load followed by 5 mg/kg per tube TID. - Valproic acid level ordered; to be drawn 24 hours following the IV load.  - Ammonia level has been ordered.  - Repeat EEGs have been ordered for Thursday and Friday AM.    LOS: 6 days   _0  signed: Dr. Kerney Elbe 03/29/2021  9:51 AM

## 2021-03-29 NOTE — TOC Progression Note (Addendum)
Transition of Care [facility]) - Progression Note    Patient Details  Name: [name] MRN: [id number] Date of Birth: [birth year]  Transition of Care [facility]) CM/SW Contact  Liliana Cline, LCSW Phone Number: 03/29/2021, 9:19 AM  Clinical Narrative:     CSW called patient's spouse to discuss option of LTAC. Explained LTAC and local options including [facility]. Explained that CSW can have Representatives from the LTACs call her to go over more info/specifics if she would like. Patient's wife reported she would like to do some research on these LTACs and would like TOC to follow up with her later in the week. Provided CSW # for follow up with any questions.    Expected Discharge Plan:  (TBD) Barriers to Discharge: Continued Medical Work up  Expected Discharge Plan and Services Expected Discharge Plan:  (TBD)       Living arrangements for the past 2 months: Single Family Home                                       Social Determinants of Health (SDOH) Interventions    Readmission Risk Interventions No flowsheet data found.

## 2021-03-29 NOTE — Progress Notes (Signed)
Pt taken to CT on the ventilator and returned to ICU 15 without incident. Pt remains on the vent and tol well at this time.

## 2021-03-29 NOTE — Consult Note (Signed)
PHARMACY CONSULT NOTE Pharmacy Consult for Electrolyte Monitoring and Replacement   Recent Labs: Potassium (mmol/L)  Date Value  03/29/2021 3.8   Magnesium (mg/dL)  Date Value  24/40/1027 2.4   Calcium (mg/dL)  Date Value  25/36/6440 9.2   Albumin (g/dL)  Date Value  34/74/2595 3.0 (L)   Phosphorus (mg/dL)  Date Value  63/87/5643 3.9   Sodium (mmol/L)  Date Value  03/29/2021 150 (H)   Assessment: 51 year old male with hypertension BIB EMS for STEMI found to be in Vfib on arrival. Patient converted to asystole after defibrillation by EMS, but subsequently with ROSC after CPR/epi. He was intubated in the ED. Patient was taken to the cath lab and was found to have no significant CAD and an LVEF <20%. Patient was then transferred to the CCU. Pharmacy has been consulted to monitor and replace electrolytes.   Na: [id number]  Nutrition: tube feeds + supplements BID, free water q4H  Goal of Therapy:  Potassium ~ 4, magnesium ~ 2, all other electrolytes within normal limits  Plan:  --Na 150 - on D5 + free water flushes --No other replacement indicated at this time --Continue to follow along  Pricilla Riffle, PharmD, BCPS Clinical Pharmacist 03/29/2021 1:37 PM

## 2021-03-29 NOTE — Progress Notes (Signed)
GI note  We intended to place a PEG tube for him but was informed that he was having seizures hence the procedure was canceled.  I informed Dr. Belia Heman to get back in touch with [place] when they feel it is right to have the PEG tube placed  Dr Wyline Mood MD,MRCP [facility]) Gastroenterology/Hepatology Pager: [phone]

## 2021-03-29 NOTE — Progress Notes (Signed)
Eeg done 

## 2021-03-29 NOTE — Progress Notes (Signed)
Another aunt came to see patient and called wife saying he was gagging on the tube.  Nurse went to see patient and wife said via phone to suction patient.  Nurse observed patient laying still and not coughing .  Suctioned patient per wife request and he coughed.  Aunt said told wife he is in so much pain and needs medication.  I explained to wife that we are trying to get an accurate neuro exam and would like to hold off on pain medication.  Wife demanded pain medication be given to patient.  Dilaudid 1 mg IV given.

## 2021-03-29 NOTE — Progress Notes (Signed)
NAME:  Billy Cherry, MRN:  [id number], DOB:  02-02-1971, LOS: 6 ADMISSION DATE:  03/23/2021, CONSULTATION DATE:  1/19 REFERRING MD:  Lorinda Creed, CHIEF COMPLAINT:  Found down, cardiac arrest   Brief Pt Description / Synopsis:  51 year old male with out-of-hospital V. fib cardiac arrest in setting of respiratory arrest due to suspected angioedema from ACE inhibitor leading to asphyxiation. Cardiac catheterization without evidence of significant coronary artery disease. Now with concern for anoxic brain injury.  History of Present Illness:  51 y/o male was found down by his wife this evening with agonal respirations.  911 called, received out of hospital CPR, 1 shock with defibrillator, 2 doses of epinephrine.  Had return of spontaneous circulation.  Concern on his 12 lead EKG for anterolateral ST elevation so he was taken to the cath lab where his study showed no significant coronary artery disease and an LVEF 45-50%.   He remained encephalopathic post cardiac arrest so PCCM was consulted.    Family notes he rarely drinks alcohol, doesn't smoke, doesn't use illicit drugs.  Takes blood pressure medicine at home, may have run out recently.   Pertinent  Medical History  Hypertension  Micro Data:  1/19 SARS CoV2/Flu > negative 1/19: MRSA PCR>> negative 1/21: Tracheal aspirate>> normal respiratory flora 1/21: Blood culture x2>> no growth to date  Antimicrobials:  Ceftriaxone 1/21>>  Significant Hospital Events: Including procedures, antibiotic start and stop dates in addition to other pertinent events   1/19 admission, left heart cath without significant CAD.  Concern for anoxic brain injury 1/20: MRI pending, Neuro following 1/21: MRI shows areas of hypoxic-ischemic injury bilaterally. Fevered; cultures obtained. 1/23 severe brain damage 1/24 evidence of severe brain damage, remains critically ill 1/25: Plan for PEG today. Neuro exam unchanged.  Does exhibit new eyelid tremor like  movements and lip/jaw tremor like vs clonus movement.  Bolused Keppra and increased maintenance dose.  Repeat EEG.  Interim History / Subjective:  -No significant events reported overnight -Afebrile, hypertensive w/ SBP 190's ~ plan to start Clevidipine -Neuro exam unchanged (+ brainstem reflexes, but does not withdraw to pain) -Does have eye twitching/remor like movements and rhythmic lip & tongue fasciculations concerning for clonus movement vs. possible seizure activity ~ discussed with Neurology, will give 2000 mg bolus of Keppra and increase BID dosing to 750 mg -Repeat EEG -Plan for PEG placement today by GI -Increase D5W to 75 ml/hr given serum Na 150  Objective   Blood pressure (!) 161/104, pulse 89, temperature 99.5 F (37.5 C), resp. rate (!) 8, height 5' 10.98" (1.803 m), weight 102 kg, SpO2 100 %.    Vent Mode: PSV;CPAP FiO2 (%):  [30 %] 30 % Set Rate:  [20 bmp] 20 bmp Vt Set:  [500 mL] 500 mL PEEP:  [5 cmH20] 5 cmH20 Pressure Support:  [5 cmH20-10 cmH20] 5 cmH20 Plateau Pressure:  [15 cmH20] 15 cmH20   Intake/Output Summary (Last 24 hours) at 03/29/2021 1975 Last data filed at 03/29/2021 0800 Gross per 24 hour  Intake 2806.36 ml  Output 3525 ml  Net -718.64 ml    Filed Weights   03/27/21 0500 03/28/21 0500 03/29/21 0500  Weight: 108.2 kg 102.2 kg 102 kg    Examination:  General: Acutely ill-appearing male, laying in bed, intubated, no acute distress HENT: NCAT ETT in place PULM: Clear to auscultation bilaterally, no wheezing or rales noted, even, in PSV, no accessory muscle use CV: RRR, no mgr GI: BS+, soft, nontender MSK: normal bulk and tone, no  deformities, no edema Neuro: Brainstem reflexes intact (positive cough/gag/corneal reflexes, overbreathing the ventilator), pupils PERRLA, however does not withdraw from painful stimulation, new eyelid tremor like movements along with lip/jaw tremor vs. Clonus like movement  Resolved Hospital Problem list      Assessment & Plan:   V. Fib arrest, left heart cath without significant coronary artery disease ~ suspect pt may have developed mild Angioedema due to home Lisinopril leading to airway obstruction with respiratory arrest Acute HFrEF (LVEF <20%, global hypokinesis) Hypertensive  -Continuous cardiac monitoring -Start Clevidipine to maintain SBP <140 -GDMT includes Coreg 25 mg twice daily, hydralazine 25 mg every 8 hours and uptitrating imdur to 35m TID today for better BP control -NO ACEi or ARB due to suspected angioedema -Echocardiogram 03/23/21>> LVEF <20% with global hypokinesis, Grade II diastolic dysfunction, RV function normal -Cardiology following, appreciate input -Cardiac CATH without significant coronary artery disease -Urine drug screen negative -Serum Ethyl Alcohol negative   Acute Metabolic Encephalopathy post cardiac arrest Anoxic Brain Injury -Maintain a RASS goal of 0 to -1 -Currently not requiring sedation -Avoid sedating medications as able -Daily wake up assessment -Completed Normothermia protocol -MRI Brain: consistent with hypoxic ischemic injury involving the bilateral ACA-MCA watershed cortex, bilateral occipital cortex and basal ganglia. -EEG with moderate diffuse slowing indicative of global cerebral dysfunction, no seizure -Neurology following, appreciate input -Bolus 2000 mg Keppra, and increase maintenance dose to 750 mg BID -Repeat EEG 1/25   Acute Hypoxic Respiratory Failure in the setting of cardiac arrest & Anoxic Brain Injury -Will need TRACHEOSTOMY for survival, ENT consulted ~ tentative plan for Trach next Tuesday 2/1 -Full vent support, implement lung protective strategies -Plateau pressures less than 30 cm H20 -Wean FiO2 & PEEP as tolerated to maintain O2 sats >92% -Follow intermittent Chest X-ray & ABG as needed -Spontaneous Breathing Trials when respiratory parameters met and mental status permits -Implement VAP Bundle -Prn  Bronchodilators   Acute Kidney Injury Hypernatremia Creatinine 2.08 on admission -Monitor I&O's / urinary output -Follow BMP -Ensure adequate renal perfusion -Avoid nephrotoxic agents as able -Replace electrolytes as indicated -Increase D5W to 75 ml/hr 1/25   Leukocytosis, suspect due to Aspiration Pneumonia -Monitor fever curve -Trend WBC's  -Follow cultures as above -Continue Ceftriaxone   Hyperglycemia -CBG's q4h; Target range of 140 to 180 -SSI -Follow ICU Hypo/Hyperglycemia protocol      Pt is critically ill, concern for anoxic brain injury.  Prognosis is guarded.  High risk for further cardiac arrest and death. Recommend DNR status.    Best Practice (right click and "Reselect all SmartList Selections" daily)   Diet/type: tubefeeds DVT prophylaxis: Lovenox GI prophylaxis: PPI Lines: N/A Foley:  Yes, and it is still needed Code Status:  full code Last date of multidisciplinary goals of care discussion [1/25]  Labs   CBC: Recent Labs  Lab 03/23/21 0254 03/24/21 0513 03/25/21 0441 03/26/21 0534 03/27/21 0321 03/28/21 0538 03/29/21 0615  WBC 12.6*   < > 20.7* 21.2* 15.9* 13.3* 13.6*  NEUTROABS 5.8  --   --   --   --   --   --   HGB 14.8   < > 14.4 13.5 12.6* 12.3* 12.5*  HCT 45.2   < > 43.7 41.6 39.7 38.6* 39.9  MCV 93.2   < > 91.8 92.7 93.2 93.7 95.5  PLT 228   < > 201 199 214 245 254   < > = values in this interval not displayed.     Basic Metabolic Panel: Recent  Labs  Lab 03/24/21 0513 03/24/21 1700 03/25/21 0441 03/26/21 0534 03/27/21 0321 03/28/21 0538 03/29/21 0615  NA 139  --  140 142 148* 151* 150*  K 3.6  --  3.9 3.9 4.0 4.2 3.8  CL 105  --  104 110 115* 116* 115*  CO2 26  --  _0 GLUCOSE 95  --  118* 154* 129* 125* 144*  BUN 38*  --  58* 65* 74* 78* 60*  CREATININE 1.91*  --  2.53* 1.87* 1.74* 1.90* 1.68*  CALCIUM 8.4*  --  8.5* 8.7* 8.8* 9.0 9.2  MG 2.3 2.2  --   --  2.9* 2.8* 2.4  PHOS 4.2 5.0*  --   --  4.5 4.0  3.9    GFR: Estimated Creatinine Clearance: 64 mL/min (A) (by C-G formula based on SCr of 1.68 mg/dL (H)). Recent Labs  Lab 03/26/21 0534 03/27/21 0321 03/28/21 0538 03/29/21 0615  WBC 21.2* 15.9* 13.3* 13.6*     Liver Function Tests: Recent Labs  Lab 03/23/21 0254 03/24/21 0513 03/25/21 0441 03/26/21 0534  AST 150* 123* 56* 31  ALT 130* 92* 63* 44  ALKPHOS 74 43 43 40  BILITOT 1.2 1.1 1.0 0.8  PROT 7.2 7.1 6.8 6.8  ALBUMIN 3.8 3.5 3.4* 3.0*    No results for input(s): LIPASE, AMYLASE in the last 168 hours. No results for input(s): AMMONIA in the last 168 hours.  ABG    Component Value Date/Time   PHART 7.43 03/24/2021 0500   PCO2ART 36 03/24/2021 0500   PO2ART 111 (H) 03/24/2021 0500   HCO3 23.9 03/24/2021 0500   ACIDBASEDEF 0.3 03/23/2021 0556   O2SAT 98.5 03/24/2021 0500     Coagulation Profile: Recent Labs  Lab 03/23/21 0254  INR 1.0     Cardiac Enzymes: No results for input(s): CKTOTAL, CKMB, CKMBINDEX, TROPONINI in the last 168 hours.  HbA1C: Hgb A1c MFr Bld  Date/Time Value Ref Range Status  03/23/2021 06:23 AM 5.4 4.8 - 5.6 % Final    Comment:    (NOTE) Pre diabetes:          5.7%-6.4%  Diabetes:              >6.4%  Glycemic control for   <7.0% adults with diabetes   03/23/2021 02:54 AM 5.5 4.8 - 5.6 % Final    Comment:    DUPLICATE AT 2395 32/04/3341. THE NEXT SAMPLE WAS CREDITED. (NOTE) Pre diabetes:          5.7%-6.4%  Diabetes:              >6.4%  Glycemic control for   <7.0% adults with diabetes     CBG: Recent Labs  Lab 03/28/21 1125 03/28/21 1614 03/28/21 2006 03/29/21 0411 03/29/21 0721  GLUCAP 143* 134* 113* 98 129*     Review of Systems:   Cannot obtain due to intubation, unresponsiveness  Past Medical History:  He,  has a past medical history of Hypertension.   Surgical History:   Past Surgical History:  Procedure Laterality Date   BACK SURGERY     CORONARY/GRAFT ACUTE MI REVASCULARIZATION N/A  03/23/2021   Procedure: Coronary/Graft Acute MI Revascularization;  Surgeon: Isaias Cowman, MD;  Location: [facility];  Service: Cardiovascular;  Laterality: N/A;   LEFT HEART CATH AND CORONARY ANGIOGRAPHY N/A 03/23/2021   Procedure: LEFT HEART CATH AND CORONARY ANGIOGRAPHY;  Surgeon: Isaias Cowman, MD;  Location: [facility];  Service: Cardiovascular;  Laterality: N/A;   SHOULDER SURGERY       Social History:   reports that he has never smoked. He has never used smokeless tobacco. He reports current alcohol use. He reports that he does not use drugs.   Family History:  His family history is not on file.   Allergies Allergies  Allergen Reactions   Lisinopril Swelling    angioedema     Home Medications  Prior to Admission medications   Medication Sig Start Date End Date Taking? Authorizing Provider  lisinopril (PRINIVIL,ZESTRIL) 10 MG tablet Take 10 mg by mouth daily.  11/12/16   [provider]  lisinopril (ZESTRIL) 10 MG tablet Take 1 tablet (10 mg total) by mouth daily. 12/06/19 12/05/20  Johnn Hai, PA-C     Critical care time: 40 minutes    Darel Hong, AGACNP-BC [facility] Prefer epic messenger for cross cover needs If after hours, please call E-link

## 2021-03-30 ENCOUNTER — Inpatient Hospital Stay (HOSPITAL_COMMUNITY): Payer: BC Managed Care – PPO

## 2021-03-30 ENCOUNTER — Inpatient Hospital Stay (HOSPITAL_COMMUNITY)
Admission: AD | Admit: 2021-03-30 | Discharge: 2021-10-04 | DRG: 004 | Disposition: A | Discharge status: Home Health | Payer: BC Managed Care – PPO | Source: Other Acute Inpatient Hospital | Attending: Internal Medicine | Admitting: Internal Medicine

## 2021-03-30 DIAGNOSIS — Z931 Gastrostomy status: Secondary | ICD-10-CM

## 2021-03-30 DIAGNOSIS — I5020 Unspecified systolic (congestive) heart failure: Secondary | ICD-10-CM | POA: Diagnosis present

## 2021-03-30 DIAGNOSIS — Z4659 Encounter for fitting and adjustment of other gastrointestinal appliance and device: Secondary | ICD-10-CM

## 2021-03-30 DIAGNOSIS — E871 Hypo-osmolality and hyponatremia: Secondary | ICD-10-CM | POA: Diagnosis not present

## 2021-03-30 DIAGNOSIS — Z8701 Personal history of pneumonia (recurrent): Secondary | ICD-10-CM

## 2021-03-30 DIAGNOSIS — Y848 Other medical procedures as the cause of abnormal reaction of the patient, or of later complication, without mention of misadventure at the time of the procedure: Secondary | ICD-10-CM | POA: Diagnosis not present

## 2021-03-30 DIAGNOSIS — I82409 Acute embolism and thrombosis of unspecified deep veins of unspecified lower extremity: Secondary | ICD-10-CM | POA: Diagnosis present

## 2021-03-30 DIAGNOSIS — R7881 Bacteremia: Secondary | ICD-10-CM

## 2021-03-30 DIAGNOSIS — Z6833 Body mass index (BMI) 33.0-33.9, adult: Secondary | ICD-10-CM

## 2021-03-30 DIAGNOSIS — J95851 Ventilator associated pneumonia: Secondary | ICD-10-CM | POA: Diagnosis not present

## 2021-03-30 DIAGNOSIS — Z8674 Personal history of sudden cardiac arrest: Secondary | ICD-10-CM | POA: Diagnosis not present

## 2021-03-30 DIAGNOSIS — R1310 Dysphagia, unspecified: Secondary | ICD-10-CM | POA: Diagnosis not present

## 2021-03-30 DIAGNOSIS — Z79899 Other long term (current) drug therapy: Secondary | ICD-10-CM

## 2021-03-30 DIAGNOSIS — E870 Hyperosmolality and hypernatremia: Secondary | ICD-10-CM | POA: Diagnosis present

## 2021-03-30 DIAGNOSIS — J15211 Pneumonia due to Methicillin susceptible Staphylococcus aureus: Secondary | ICD-10-CM | POA: Diagnosis not present

## 2021-03-30 DIAGNOSIS — G40901 Epilepsy, unspecified, not intractable, with status epilepticus: Secondary | ICD-10-CM | POA: Diagnosis present

## 2021-03-30 DIAGNOSIS — R739 Hyperglycemia, unspecified: Secondary | ICD-10-CM | POA: Diagnosis present

## 2021-03-30 DIAGNOSIS — R403 Persistent vegetative state: Secondary | ICD-10-CM | POA: Diagnosis present

## 2021-03-30 DIAGNOSIS — I469 Cardiac arrest, cause unspecified: Principal | ICD-10-CM

## 2021-03-30 DIAGNOSIS — E875 Hyperkalemia: Secondary | ICD-10-CM | POA: Diagnosis not present

## 2021-03-30 DIAGNOSIS — Z930 Tracheostomy status: Secondary | ICD-10-CM

## 2021-03-30 DIAGNOSIS — G253 Myoclonus: Secondary | ICD-10-CM | POA: Diagnosis not present

## 2021-03-30 DIAGNOSIS — E669 Obesity, unspecified: Secondary | ICD-10-CM | POA: Diagnosis present

## 2021-03-30 DIAGNOSIS — Z515 Encounter for palliative care: Secondary | ICD-10-CM | POA: Diagnosis not present

## 2021-03-30 DIAGNOSIS — L89893 Pressure ulcer of other site, stage 3: Secondary | ICD-10-CM | POA: Diagnosis not present

## 2021-03-30 DIAGNOSIS — T783XXA Angioneurotic edema, initial encounter: Secondary | ICD-10-CM | POA: Diagnosis present

## 2021-03-30 DIAGNOSIS — T17908A Unspecified foreign body in respiratory tract, part unspecified causing other injury, initial encounter: Secondary | ICD-10-CM

## 2021-03-30 DIAGNOSIS — A419 Sepsis, unspecified organism: Secondary | ICD-10-CM

## 2021-03-30 DIAGNOSIS — I110 Hypertensive heart disease with heart failure: Secondary | ICD-10-CM | POA: Diagnosis present

## 2021-03-30 DIAGNOSIS — Z7901 Long term (current) use of anticoagulants: Secondary | ICD-10-CM | POA: Diagnosis not present

## 2021-03-30 DIAGNOSIS — J151 Pneumonia due to Pseudomonas: Secondary | ICD-10-CM | POA: Diagnosis not present

## 2021-03-30 DIAGNOSIS — I2782 Chronic pulmonary embolism: Secondary | ICD-10-CM | POA: Diagnosis present

## 2021-03-30 DIAGNOSIS — Y846 Urinary catheterization as the cause of abnormal reaction of the patient, or of later complication, without mention of misadventure at the time of the procedure: Secondary | ICD-10-CM | POA: Diagnosis not present

## 2021-03-30 DIAGNOSIS — N179 Acute kidney failure, unspecified: Secondary | ICD-10-CM | POA: Diagnosis present

## 2021-03-30 DIAGNOSIS — J189 Pneumonia, unspecified organism: Secondary | ICD-10-CM | POA: Diagnosis not present

## 2021-03-30 DIAGNOSIS — Z20822 Contact with and (suspected) exposure to covid-19: Secondary | ICD-10-CM | POA: Diagnosis present

## 2021-03-30 DIAGNOSIS — R6520 Severe sepsis without septic shock: Secondary | ICD-10-CM | POA: Diagnosis not present

## 2021-03-30 DIAGNOSIS — I82442 Acute embolism and thrombosis of left tibial vein: Secondary | ICD-10-CM | POA: Diagnosis not present

## 2021-03-30 DIAGNOSIS — Z9911 Dependence on respirator [ventilator] status: Secondary | ICD-10-CM | POA: Diagnosis not present

## 2021-03-30 DIAGNOSIS — I462 Cardiac arrest due to underlying cardiac condition: Secondary | ICD-10-CM | POA: Diagnosis present

## 2021-03-30 DIAGNOSIS — E7220 Disorder of urea cycle metabolism, unspecified: Secondary | ICD-10-CM | POA: Diagnosis present

## 2021-03-30 DIAGNOSIS — Y732 Prosthetic and other implants, materials and accessory gastroenterology and urology devices associated with adverse incidents: Secondary | ICD-10-CM | POA: Diagnosis not present

## 2021-03-30 DIAGNOSIS — I5022 Chronic systolic (congestive) heart failure: Secondary | ICD-10-CM | POA: Diagnosis not present

## 2021-03-30 DIAGNOSIS — A4101 Sepsis due to Methicillin susceptible Staphylococcus aureus: Secondary | ICD-10-CM | POA: Diagnosis not present

## 2021-03-30 DIAGNOSIS — T17908D Unspecified foreign body in respiratory tract, part unspecified causing other injury, subsequent encounter: Secondary | ICD-10-CM

## 2021-03-30 DIAGNOSIS — J9811 Atelectasis: Secondary | ICD-10-CM | POA: Diagnosis not present

## 2021-03-30 DIAGNOSIS — R509 Fever, unspecified: Secondary | ICD-10-CM | POA: Diagnosis not present

## 2021-03-30 DIAGNOSIS — I10 Essential (primary) hypertension: Secondary | ICD-10-CM

## 2021-03-30 DIAGNOSIS — J9611 Chronic respiratory failure with hypoxia: Secondary | ICD-10-CM | POA: Diagnosis not present

## 2021-03-30 DIAGNOSIS — I4901 Ventricular fibrillation: Secondary | ICD-10-CM | POA: Diagnosis present

## 2021-03-30 DIAGNOSIS — J9601 Acute respiratory failure with hypoxia: Secondary | ICD-10-CM | POA: Diagnosis not present

## 2021-03-30 DIAGNOSIS — Z9289 Personal history of other medical treatment: Secondary | ICD-10-CM

## 2021-03-30 DIAGNOSIS — I429 Cardiomyopathy, unspecified: Secondary | ICD-10-CM | POA: Diagnosis present

## 2021-03-30 DIAGNOSIS — J690 Pneumonitis due to inhalation of food and vomit: Secondary | ICD-10-CM | POA: Diagnosis not present

## 2021-03-30 DIAGNOSIS — T464X5A Adverse effect of angiotensin-converting-enzyme inhibitors, initial encounter: Secondary | ICD-10-CM | POA: Diagnosis present

## 2021-03-30 DIAGNOSIS — G40409 Other generalized epilepsy and epileptic syndromes, not intractable, without status epilepticus: Secondary | ICD-10-CM | POA: Diagnosis not present

## 2021-03-30 DIAGNOSIS — H109 Unspecified conjunctivitis: Secondary | ICD-10-CM | POA: Diagnosis not present

## 2021-03-30 DIAGNOSIS — I2699 Other pulmonary embolism without acute cor pulmonale: Secondary | ICD-10-CM | POA: Diagnosis present

## 2021-03-30 DIAGNOSIS — I3139 Other pericardial effusion (noninflammatory): Secondary | ICD-10-CM | POA: Diagnosis present

## 2021-03-30 DIAGNOSIS — I5023 Acute on chronic systolic (congestive) heart failure: Secondary | ICD-10-CM | POA: Diagnosis present

## 2021-03-30 DIAGNOSIS — K219 Gastro-esophageal reflux disease without esophagitis: Secondary | ICD-10-CM | POA: Diagnosis not present

## 2021-03-30 DIAGNOSIS — E440 Moderate protein-calorie malnutrition: Secondary | ICD-10-CM | POA: Diagnosis present

## 2021-03-30 DIAGNOSIS — Y95 Nosocomial condition: Secondary | ICD-10-CM | POA: Diagnosis not present

## 2021-03-30 DIAGNOSIS — J9621 Acute and chronic respiratory failure with hypoxia: Secondary | ICD-10-CM | POA: Diagnosis present

## 2021-03-30 DIAGNOSIS — B957 Other staphylococcus as the cause of diseases classified elsewhere: Secondary | ICD-10-CM

## 2021-03-30 DIAGNOSIS — L89892 Pressure ulcer of other site, stage 2: Secondary | ICD-10-CM | POA: Diagnosis not present

## 2021-03-30 DIAGNOSIS — I82452 Acute embolism and thrombosis of left peroneal vein: Secondary | ICD-10-CM | POA: Diagnosis not present

## 2021-03-30 DIAGNOSIS — I82412 Acute embolism and thrombosis of left femoral vein: Secondary | ICD-10-CM | POA: Diagnosis not present

## 2021-03-30 DIAGNOSIS — I82432 Acute embolism and thrombosis of left popliteal vein: Secondary | ICD-10-CM | POA: Diagnosis not present

## 2021-03-30 DIAGNOSIS — I2109 ST elevation (STEMI) myocardial infarction involving other coronary artery of anterior wall: Secondary | ICD-10-CM | POA: Diagnosis present

## 2021-03-30 DIAGNOSIS — D649 Anemia, unspecified: Secondary | ICD-10-CM | POA: Diagnosis present

## 2021-03-30 DIAGNOSIS — R1110 Vomiting, unspecified: Secondary | ICD-10-CM | POA: Diagnosis not present

## 2021-03-30 DIAGNOSIS — Z7189 Other specified counseling: Secondary | ICD-10-CM | POA: Diagnosis not present

## 2021-03-30 DIAGNOSIS — G931 Anoxic brain damage, not elsewhere classified: Secondary | ICD-10-CM | POA: Diagnosis present

## 2021-03-30 DIAGNOSIS — L89611 Pressure ulcer of right heel, stage 1: Secondary | ICD-10-CM | POA: Diagnosis not present

## 2021-03-30 DIAGNOSIS — M7989 Other specified soft tissue disorders: Secondary | ICD-10-CM | POA: Diagnosis not present

## 2021-03-30 DIAGNOSIS — L89302 Pressure ulcer of unspecified buttock, stage 2: Secondary | ICD-10-CM | POA: Diagnosis present

## 2021-03-30 DIAGNOSIS — Z7401 Bed confinement status: Secondary | ICD-10-CM

## 2021-03-30 DIAGNOSIS — L8990 Pressure ulcer of unspecified site, unspecified stage: Secondary | ICD-10-CM | POA: Insufficient documentation

## 2021-03-30 DIAGNOSIS — G9341 Metabolic encephalopathy: Secondary | ICD-10-CM | POA: Diagnosis present

## 2021-03-30 DIAGNOSIS — J9600 Acute respiratory failure, unspecified whether with hypoxia or hypercapnia: Secondary | ICD-10-CM | POA: Diagnosis present

## 2021-03-30 DIAGNOSIS — Z86718 Personal history of other venous thrombosis and embolism: Secondary | ICD-10-CM

## 2021-03-30 DIAGNOSIS — Z888 Allergy status to other drugs, medicaments and biological substances status: Secondary | ICD-10-CM

## 2021-03-30 DIAGNOSIS — A4152 Sepsis due to Pseudomonas: Secondary | ICD-10-CM | POA: Diagnosis not present

## 2021-03-30 DIAGNOSIS — R197 Diarrhea, unspecified: Secondary | ICD-10-CM | POA: Diagnosis not present

## 2021-03-30 LAB — PHOSPHORUS: Phosphorus: 4.2 mg/dL (ref 2.5–4.6)

## 2021-03-30 LAB — BASIC METABOLIC PANEL
Anion gap: 11 (ref 5–15)
BUN: 54 mg/dL — ABNORMAL HIGH (ref 6–20)
CO2: 26 mmol/L (ref 22–32)
Calcium: 9.1 mg/dL (ref 8.9–10.3)
Chloride: 111 mmol/L (ref 98–111)
Creatinine, Ser: 1.76 mg/dL — ABNORMAL HIGH (ref 0.61–1.24)
GFR, Estimated: 47 mL/min — ABNORMAL LOW (ref >60)
Glucose, Bld: 142 mg/dL — ABNORMAL HIGH (ref 70–99)
Potassium: 3.7 mmol/L (ref 3.5–5.1)
Sodium: 148 mmol/L — ABNORMAL HIGH (ref 135–145)

## 2021-03-30 LAB — GLUCOSE, CAPILLARY
Glucose-Capillary: 115 mg/dL — ABNORMAL HIGH (ref 70–99)
Glucose-Capillary: 98 mg/dL (ref 70–99)
Glucose-Capillary: 115 mg/dL — ABNORMAL HIGH (ref 70–99)
Glucose-Capillary: 126 mg/dL — ABNORMAL HIGH (ref 70–99)
Glucose-Capillary: 134 mg/dL — ABNORMAL HIGH (ref 70–99)
Glucose-Capillary: 122 mg/dL — ABNORMAL HIGH (ref 70–99)
Glucose-Capillary: 155 mg/dL — ABNORMAL HIGH (ref 70–99)
Glucose-Capillary: 108 mg/dL — ABNORMAL HIGH (ref 70–99)
Glucose-Capillary: 117 mg/dL — ABNORMAL HIGH (ref 70–99)
Glucose-Capillary: 121 mg/dL — ABNORMAL HIGH (ref 70–99)

## 2021-03-30 LAB — CBC
HCT: 40 % (ref 39.0–52.0)
Hemoglobin: 12.7 g/dL — ABNORMAL LOW (ref 13.0–17.0)
MCH: 30.1 pg (ref 26.0–34.0)
MCHC: 31.8 g/dL (ref 30.0–36.0)
MCV: 94.8 fL (ref 80.0–100.0)
Platelets: 277 10*3/uL (ref 150–400)
RBC: 4.22 MIL/uL (ref 4.22–5.81)
RDW: 13.7 % (ref 11.5–15.5)
WBC: 13.4 10*3/uL — ABNORMAL HIGH (ref 4.0–10.5)
nRBC: 0 % (ref 0.0–0.2)

## 2021-03-30 LAB — MAGNESIUM: Magnesium: 2.4 mg/dL (ref 1.7–2.4)

## 2021-03-30 LAB — CULTURE, BLOOD (ROUTINE X 2)
Culture: NO GROWTH
Culture: NO GROWTH
Special Requests: ADEQUATE
Special Requests: ADEQUATE

## 2021-03-30 LAB — VALPROIC ACID LEVEL: Valproic Acid Lvl: 37 ug/mL — ABNORMAL LOW (ref 50.0–100.0)

## 2021-03-30 MED ORDER — HYDRALAZINE HCL 20 MG/ML IJ SOLN
10.0000 mg | INTRAMUSCULAR | Status: DC | PRN
  Administered 2021-03-31 – 2021-04-17 (×4): 10 mg via INTRAVENOUS
  Filled 2021-03-30 (×5): qty 1

## 2021-03-30 MED ORDER — SODIUM CHLORIDE 0.9% FLUSH: 3.0000 mL | Freq: Two times a day (BID) | INTRAVENOUS | Status: DC

## 2021-03-30 MED ORDER — ISOSORBIDE DINITRATE 10 MG PO TABS
20.0000 mg | ORAL_TABLET | Freq: Three times a day (TID) | ORAL | Status: DC
  Administered 2021-03-30 – 2021-06-09 (×213): 20 mg
  Filled 2021-03-30: qty 2
  Filled 2021-03-30: qty 1
  Filled 2021-03-30 (×2): qty 2
  Filled 2021-03-30: qty 1
  Filled 2021-03-30 (×5): qty 2
  Filled 2021-03-30: qty 1
  Filled 2021-03-30 (×5): qty 2
  Filled 2021-03-30: qty 1
  Filled 2021-03-30 (×3): qty 2
  Filled 2021-03-30: qty 1
  Filled 2021-03-30 (×6): qty 2
  Filled 2021-03-30: qty 1
  Filled 2021-03-30 (×11): qty 2
  Filled 2021-03-30: qty 1
  Filled 2021-03-30 (×11): qty 2
  Filled 2021-03-30: qty 1
  Filled 2021-03-30 (×3): qty 2
  Filled 2021-03-30 (×2): qty 1
  Filled 2021-03-30: qty 2
  Filled 2021-03-30: qty 1
  Filled 2021-03-30 (×6): qty 2
  Filled 2021-03-30: qty 1
  Filled 2021-03-30 (×8): qty 2
  Filled 2021-03-30 (×2): qty 1
  Filled 2021-03-30 (×5): qty 2
  Filled 2021-03-30: qty 1
  Filled 2021-03-30 (×11): qty 2
  Filled 2021-03-30: qty 1
  Filled 2021-03-30 (×2): qty 2
  Filled 2021-03-30: qty 1
  Filled 2021-03-30 (×16): qty 2
  Filled 2021-03-30: qty 1
  Filled 2021-03-30 (×19): qty 2
  Filled 2021-03-30: qty 1
  Filled 2021-03-30 (×7): qty 2
  Filled 2021-03-30: qty 1
  Filled 2021-03-30: qty 2
  Filled 2021-03-30: qty 1
  Filled 2021-03-30: qty 2
  Filled 2021-03-30 (×2): qty 1
  Filled 2021-03-30: qty 2
  Filled 2021-03-30 (×3): qty 1
  Filled 2021-03-30 (×28): qty 2
  Filled 2021-03-30: qty 1
  Filled 2021-03-30 (×11): qty 2
  Filled 2021-03-30 (×2): qty 1
  Filled 2021-03-30: qty 2
  Filled 2021-03-30: qty 1
  Filled 2021-03-30 (×2): qty 2
  Filled 2021-03-30: qty 1
  Filled 2021-03-30 (×2): qty 2
  Filled 2021-03-30: qty 1
  Filled 2021-03-30: qty 2
  Filled 2021-03-30: qty 1
  Filled 2021-03-30 (×2): qty 2
  Filled 2021-03-30: qty 1
  Filled 2021-03-30 (×10): qty 2

## 2021-03-30 MED ORDER — ORAL CARE MOUTH RINSE: 15.0000 mL | OROMUCOSAL | 0 refills | Status: DC

## 2021-03-30 MED ORDER — ACETAMINOPHEN 325 MG PO TABS: 650.0000 mg | ORAL_TABLET | Freq: Four times a day (QID) | ORAL | Status: DC | PRN

## 2021-03-30 MED ORDER — CHLORHEXIDINE GLUCONATE CLOTH 2 % EX PADS: 6.0000 | MEDICATED_PAD | Freq: Every day | CUTANEOUS | Status: DC

## 2021-03-30 MED ORDER — SODIUM CHLORIDE 0.9 % IV SOLN
750.0000 mg | Freq: Two times a day (BID) | INTRAVENOUS | Status: DC
  Administered 2021-03-30 – 2021-04-03 (×8): 750 mg via INTRAVENOUS
  Filled 2021-03-30 (×9): qty 7.5

## 2021-03-30 MED ORDER — LABETALOL HCL 5 MG/ML IV SOLN
5.0000 mg | INTRAVENOUS | Status: DC | PRN
  Administered 2021-04-25 – 2021-05-10 (×5): 5 mg via INTRAVENOUS
  Filled 2021-03-30 (×5): qty 4

## 2021-03-30 MED ORDER — VITAL HIGH PROTEIN PO LIQD
1000.0000 mL | ORAL | Status: DC
  Administered 2021-03-30: 1000 mL

## 2021-03-30 MED ORDER — DOCUSATE SODIUM 50 MG/5ML PO LIQD: 100.0000 mg | Freq: Two times a day (BID) | ORAL | Status: DC | PRN

## 2021-03-30 MED ORDER — SODIUM CHLORIDE 0.9 % IV SOLN: 750.0000 mg | Freq: Two times a day (BID) | INTRAVENOUS | Status: DC

## 2021-03-30 MED ORDER — DEXTROSE 5 % IV SOLN: 75.0000 mL/h | INTRAVENOUS | Status: DC

## 2021-03-30 MED ORDER — POLYETHYLENE GLYCOL 3350 17 G PO PACK: 17.0000 g | PACK | Freq: Every day | ORAL | Status: DC | PRN

## 2021-03-30 MED ORDER — FENTANYL CITRATE PF 50 MCG/ML IJ SOSY: 50.0000 ug | PREFILLED_SYRINGE | INTRAMUSCULAR | Status: DC | PRN

## 2021-03-30 MED ORDER — ONDANSETRON HCL 4 MG/2ML IJ SOLN: 4.0000 mg | Freq: Four times a day (QID) | INTRAMUSCULAR | 0 refills | Status: DC | PRN

## 2021-03-30 MED ORDER — LABETALOL HCL 5 MG/ML IV SOLN: 5.0000 mg | INTRAVENOUS | Status: DC | PRN

## 2021-03-30 MED ORDER — DOCUSATE SODIUM 100 MG PO CAPS: 100.0000 mg | ORAL_CAPSULE | Freq: Two times a day (BID) | ORAL | Status: DC | PRN

## 2021-03-30 MED ORDER — DOCUSATE SODIUM 50 MG/5ML PO LIQD: 100.0000 mg | Freq: Two times a day (BID) | ORAL | 0 refills | Status: DC | PRN

## 2021-03-30 MED ORDER — INSULIN ASPART 100 UNIT/ML IJ SOLN
0.0000 [IU] | INTRAMUSCULAR | Status: DC
  Administered 2021-03-31 – 2021-04-03 (×19): 2 [IU] via SUBCUTANEOUS
  Administered 2021-04-04: 3 [IU] via SUBCUTANEOUS
  Administered 2021-04-04: 04:00:00 2 [IU] via SUBCUTANEOUS
  Administered 2021-04-04: 12:00:00 3 [IU] via SUBCUTANEOUS
  Administered 2021-04-04 – 2021-04-05 (×4): 2 [IU] via SUBCUTANEOUS
  Administered 2021-04-05: 3 [IU] via SUBCUTANEOUS
  Administered 2021-04-05: 04:00:00 2 [IU] via SUBCUTANEOUS
  Administered 2021-04-05: 3 [IU] via SUBCUTANEOUS
  Administered 2021-04-05 (×2): 2 [IU] via SUBCUTANEOUS
  Administered 2021-04-05: 08:00:00 3 [IU] via SUBCUTANEOUS
  Administered 2021-04-06 (×4): 2 [IU] via SUBCUTANEOUS
  Administered 2021-04-07: 04:00:00 3 [IU] via SUBCUTANEOUS
  Administered 2021-04-07 – 2021-04-08 (×8): 2 [IU] via SUBCUTANEOUS
  Administered 2021-04-08: 3 [IU] via SUBCUTANEOUS
  Administered 2021-04-08 – 2021-04-09 (×2): 2 [IU] via SUBCUTANEOUS
  Administered 2021-04-09: 12:00:00 3 [IU] via SUBCUTANEOUS
  Administered 2021-04-09 – 2021-04-23 (×48): 2 [IU] via SUBCUTANEOUS
  Administered 2021-04-23: 1 [IU] via SUBCUTANEOUS

## 2021-03-30 MED ORDER — PROSOURCE TF PO LIQD: 45.0000 mL | Freq: Two times a day (BID) | ORAL | Status: DC

## 2021-03-30 MED ORDER — HEPARIN SODIUM (PORCINE) 5000 UNIT/ML IJ SOLN
5000.0000 [IU] | Freq: Three times a day (TID) | INTRAMUSCULAR | Status: DC
  Administered 2021-03-30 – 2021-05-15 (×131): 5000 [IU] via SUBCUTANEOUS
  Filled 2021-03-30 (×133): qty 1

## 2021-03-30 MED ORDER — POLYETHYLENE GLYCOL 3350 17 G PO PACK: 17.0000 g | PACK | Freq: Every day | ORAL | 0 refills | Status: DC | PRN

## 2021-03-30 MED ORDER — FREE WATER
200.0000 mL | Status: DC
  Administered 2021-03-30 – 2021-04-01 (×9): 200 mL

## 2021-03-30 MED ORDER — ISOSORBIDE DINITRATE 20 MG PO TABS: 20.0000 mg | ORAL_TABLET | Freq: Three times a day (TID) | ORAL | Status: DC

## 2021-03-30 MED ORDER — POLYETHYLENE GLYCOL 3350 17 G PO PACK
17.0000 g | PACK | Freq: Every day | ORAL | Status: DC
  Administered 2021-03-31: 17 g
  Filled 2021-03-30: qty 1

## 2021-03-30 MED ORDER — CARVEDILOL 25 MG PO TABS: 25.0000 mg | ORAL_TABLET | Freq: Two times a day (BID) | ORAL | Status: DC

## 2021-03-30 MED ORDER — CARVEDILOL 25 MG PO TABS
25.0000 mg | ORAL_TABLET | Freq: Two times a day (BID) | ORAL | Status: DC
  Administered 2021-03-30 – 2021-10-04 (×374): 25 mg
  Filled 2021-03-30 (×9): qty 1
  Filled 2021-03-30: qty 2
  Filled 2021-03-30: qty 1
  Filled 2021-03-30: qty 2
  Filled 2021-03-30 (×18): qty 1
  Filled 2021-03-30: qty 2
  Filled 2021-03-30: qty 1
  Filled 2021-03-30: qty 2
  Filled 2021-03-30 (×2): qty 1
  Filled 2021-03-30: qty 2
  Filled 2021-03-30: qty 1
  Filled 2021-03-30: qty 2
  Filled 2021-03-30 (×7): qty 1
  Filled 2021-03-30: qty 2
  Filled 2021-03-30 (×18): qty 1
  Filled 2021-03-30: qty 2
  Filled 2021-03-30 (×16): qty 1
  Filled 2021-03-30: qty 2
  Filled 2021-03-30 (×32): qty 1
  Filled 2021-03-30 (×2): qty 2
  Filled 2021-03-30 (×20): qty 1
  Filled 2021-03-30: qty 2
  Filled 2021-03-30 (×15): qty 1
  Filled 2021-03-30: qty 2
  Filled 2021-03-30 (×2): qty 1
  Filled 2021-03-30: qty 2
  Filled 2021-03-30 (×5): qty 1
  Filled 2021-03-30: qty 2
  Filled 2021-03-30 (×25): qty 1
  Filled 2021-03-30: qty 2
  Filled 2021-03-30 (×15): qty 1
  Filled 2021-03-30: qty 2
  Filled 2021-03-30 (×17): qty 1
  Filled 2021-03-30: qty 2
  Filled 2021-03-30 (×17): qty 1
  Filled 2021-03-30: qty 2
  Filled 2021-03-30: qty 1
  Filled 2021-03-30: qty 2
  Filled 2021-03-30 (×9): qty 1
  Filled 2021-03-30: qty 2
  Filled 2021-03-30 (×35): qty 1
  Filled 2021-03-30: qty 2
  Filled 2021-03-30 (×11): qty 1
  Filled 2021-03-30: qty 2
  Filled 2021-03-30 (×78): qty 1

## 2021-03-30 MED ORDER — HYDROMORPHONE HCL 1 MG/ML IJ SOLN: 1.0000 mg | INTRAMUSCULAR | 0 refills | Status: DC | PRN

## 2021-03-30 MED ORDER — ACETAMINOPHEN 325 MG PO TABS
650.0000 mg | ORAL_TABLET | Freq: Four times a day (QID) | ORAL | Status: DC | PRN
  Administered 2021-04-01 – 2021-08-15 (×70): 650 mg
  Filled 2021-03-30 (×71): qty 2

## 2021-03-30 MED ORDER — VALPROIC ACID 250 MG/5ML PO SOLN: 500.0000 mg | Freq: Three times a day (TID) | ORAL | Status: DC

## 2021-03-30 MED ORDER — FREE WATER: 100.0000 mL | Status: DC

## 2021-03-30 MED ORDER — LABETALOL HCL 5 MG/ML IV SOLN: 10.0000 mg | INTRAVENOUS | Status: DC | PRN

## 2021-03-30 MED ORDER — PROPOFOL 1000 MG/100ML IV EMUL
5.0000 ug/kg/min | INTRAVENOUS | Status: DC
Start: 2021-03-30 — End: 2021-10-04

## 2021-03-30 MED ORDER — PANTOPRAZOLE SODIUM 40 MG IV SOLR
40.0000 mg | Freq: Every day | INTRAVENOUS | Status: DC
  Administered 2021-03-30: 40 mg via INTRAVENOUS
  Filled 2021-03-30: qty 40

## 2021-03-30 MED ORDER — FENTANYL CITRATE PF 50 MCG/ML IJ SOSY
50.0000 ug | PREFILLED_SYRINGE | INTRAMUSCULAR | Status: DC | PRN
  Administered 2021-04-03 – 2021-04-14 (×3): 50 ug via INTRAVENOUS
  Filled 2021-03-30: qty 1
  Filled 2021-03-30: qty 2
  Filled 2021-03-30 (×3): qty 1

## 2021-03-30 MED ORDER — PROPOFOL 1000 MG/100ML IV EMUL: 5.0000 ug/kg/min | INTRAVENOUS | Status: DC

## 2021-03-30 MED ORDER — ORAL CARE MOUTH RINSE
15.0000 mL | OROMUCOSAL | Status: DC
  Administered 2021-03-30 – 2021-05-04 (×329): 15 mL via OROMUCOSAL

## 2021-03-30 MED ORDER — HYDRALAZINE HCL 25 MG PO TABS
25.0000 mg | ORAL_TABLET | Freq: Three times a day (TID) | ORAL | Status: DC
  Administered 2021-03-30 – 2021-03-31 (×2): 25 mg
  Filled 2021-03-30 (×2): qty 1

## 2021-03-30 MED ORDER — HYDRALAZINE HCL 20 MG/ML IJ SOLN: 10.0000 mg | INTRAMUSCULAR | Status: DC | PRN

## 2021-03-30 MED ORDER — CLEVIDIPINE BUTYRATE 0.5 MG/ML IV EMUL: 0.0000 mg/h | INTRAVENOUS | Status: DC

## 2021-03-30 MED ORDER — SODIUM CHLORIDE 0.9 % IV SOLN: 250.0000 mL | INTRAVENOUS | 0 refills | Status: DC | PRN

## 2021-03-30 MED ORDER — DOCUSATE SODIUM 50 MG/5ML PO LIQD
100.0000 mg | Freq: Two times a day (BID) | ORAL | Status: DC
  Administered 2021-03-31: 100 mg
  Filled 2021-03-30: qty 10

## 2021-03-30 MED ORDER — SODIUM CHLORIDE 0.9 % IV SOLN: 250.0000 mL | INTRAVENOUS | 0 refills | Status: DC

## 2021-03-30 MED ORDER — INSULIN ASPART 100 UNIT/ML IJ SOLN: 0.0000 [IU] | INTRAMUSCULAR | 11 refills | Status: DC

## 2021-03-30 MED ORDER — CHLORHEXIDINE GLUCONATE 0.12% ORAL RINSE (MEDLINE KIT)
15.0000 mL | Freq: Two times a day (BID) | OROMUCOSAL | Status: DC
  Administered 2021-03-30 – 2021-05-16 (×92): 15 mL via OROMUCOSAL

## 2021-03-30 MED ORDER — CHLORHEXIDINE GLUCONATE 0.12% ORAL RINSE (MEDLINE KIT): 15.0000 mL | Freq: Two times a day (BID) | OROMUCOSAL | 0 refills | Status: DC

## 2021-03-30 MED ORDER — HYDRALAZINE HCL 25 MG PO TABS: 25.0000 mg | ORAL_TABLET | Freq: Three times a day (TID) | ORAL | Status: DC

## 2021-03-30 MED ORDER — VALPROIC ACID 250 MG/5ML PO SOLN
500.0000 mg | Freq: Three times a day (TID) | ORAL | Status: DC
  Administered 2021-03-30 – 2021-03-31 (×2): 500 mg
  Filled 2021-03-30 (×2): qty 10

## 2021-03-30 MED ORDER — VITAL HIGH PROTEIN PO LIQD: 1000.0000 mL | ORAL | Status: DC

## 2021-03-30 MED ORDER — SODIUM CHLORIDE 0.9% FLUSH: 3.0000 mL | INTRAVENOUS | Status: DC | PRN

## 2021-03-30 MED ORDER — ENOXAPARIN SODIUM 60 MG/0.6ML IJ SOSY: 0.5000 mg/kg | PREFILLED_SYRINGE | INTRAMUSCULAR | Status: DC

## 2021-03-30 MED ORDER — PANTOPRAZOLE SODIUM 40 MG PO PACK: 40.0000 mg | PACK | Freq: Every day | ORAL | Status: DC

## 2021-03-30 MED ORDER — PROSOURCE TF PO LIQD
45.0000 mL | Freq: Two times a day (BID) | ORAL | Status: DC
  Administered 2021-03-30 – 2021-03-31 (×2): 45 mL
  Filled 2021-03-30 (×2): qty 45

## 2021-03-30 NOTE — Progress Notes (Signed)
Report called to [employer] on [address] [place].  All questions answered and spouse notified of pt transport

## 2021-03-30 NOTE — Progress Notes (Signed)
[facility] Cardiology  CARDIOLOGY CONSULT NOTE  Patient ID: Billy Cherry MRN: [id number] DOB/AGE: 03/19/1970 51 y.o.  Admit date: 03/23/2021 Referring Physician [facility] Primary Physician Mena Goes [facility] family med (last seen 07/2019) Primary Cardiologist  Reason for Consultation cardiopulmonary arrest  HPI: 51 year old gentleman with a PMH significant for hypertension and hyperlipidemia referred for cardiopulmonary arrest with suspicion for ST elevation myocardial infarction.  Patient has a history of essential hypertension.  Patient was sleeping, found by his wife agonal respirations, called EMS, and performed bystander CPR.  On EMS arrival patient was found to be in ventricular fibrillation and defibrillated x1, converted to asystole given 2 rounds of epinephrine.  Post defibrillation ECG revealed sinus tachycardia with right bundle branch block and apparent ST elevation in anterolateral leads.  Following arrival at [facility] ED, patient was intubated.  ECG in the ED revealed minus rhythm 85 bpm with nonspecific ST-T abnormalities the anterolateral leads.  The patient was brought to the cardiac catheterization laboratory coronary angiography revealed insignificant coronary artery disease.  Echocardiogram revealed LVEF less than 20% with global hypokinesis.  Further history was provided per the patient's wife and the inciting event was thought to be angioedema causing hypoxia and subsequent events secondary to ACE/ARB use.  Interval history: -Isosorbide uptitrated to 20 mg 3 times daily yesterday, critical care added IV clevidipine infusion during the day for better blood pressure control. 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   Past Medical History:  Diagnosis Date   Hypertension     Past Surgical History:  Procedure  Laterality Date   BACK SURGERY     CORONARY/GRAFT ACUTE MI REVASCULARIZATION N/A 03/23/2021   Procedure: Coronary/Graft Acute MI Revascularization;  Surgeon: Isaias Cowman, MD;  Location: [place] [facility];  Service: Cardiovascular;  Laterality: N/A;   LEFT HEART CATH AND CORONARY ANGIOGRAPHY N/A 03/23/2021   Procedure: LEFT HEART CATH AND CORONARY ANGIOGRAPHY;  Surgeon: Isaias Cowman, MD;  Location: [place] [facility];  Service: Cardiovascular;  Laterality: N/A;   SHOULDER SURGERY      Medications Prior to Admission  Medication Sig Dispense Refill Last Dose   lisinopril (PRINIVIL,ZESTRIL) 10 MG tablet Take 10 mg by mouth daily.    03/22/2021   sildenafil (VIAGRA) 25 MG tablet Take by mouth as directed.   Past Month   lisinopril (ZESTRIL) 10 MG tablet Take 1 tablet (10 mg total) by mouth daily. 30 tablet 1     Social History   Socioeconomic History   Marital status: Married    Spouse name: Not on file   Number of children: Not on file   Years of education: Not on file   Highest education level: Not on file  Occupational History   Not on file  Tobacco Use   Smoking status: Never   Smokeless tobacco: Never  Substance and Sexual Activity   Alcohol use: Yes   Drug use: Never   Sexual activity: Not on file  Other Topics Concern   Not on file  Social History Narrative   Not on file   Social Determinants of Health   Financial Resource Strain: Not on file  Food Insecurity: Not on file  Transportation Needs: Not on file  Physical Activity: Not on file  Stress: Not on file  Social Connections: Not on file  Intimate Partner Violence: Not on file  No family history on file.   Review of systems complete and found to be negative unless listed above   PHYSICAL EXAM General: Well developed Black male, intubated.  HEENT:  Normocephalic and atraumatic.  Neck:  No JVD.  Lungs: intubated, Clear to ascultation anteriorly  Heart: HRRR . Normal S1 and S2 without  gallops or murmurs.  Abdomen: soft, Nondistended appearing Msk: no spontaneous movement. Extremities: No clubbing, cyanosis or edema.   Neuro: some spontaneous blinking to voice and small movement of his lips but no other spontaneous motor function.  Psych: Unable to assess  Labs:   Lab Results  Component Value Date   WBC 13.4 (H) 03/30/2021   HGB 12.7 (L) 03/30/2021   HCT 40.0 03/30/2021   MCV 94.8 03/30/2021   PLT 277 03/30/2021    Recent Labs  Lab 03/26/21 0534 03/27/21 0321 03/30/21 0428  NA 142   < > 148*  K 3.9   < > 3.7  CL 110   < > 111  CO2 24   < > 26  BUN 65*   < > 54*  CREATININE 1.87*   < > 1.76*  CALCIUM 8.7*   < > 9.1  PROT 6.8  --   --   BILITOT 0.8  --   --   ALKPHOS 40  --   --   ALT 44  --   --   AST 31  --   --   GLUCOSE 154*   < > 142*   < > = values in this interval not displayed.    No results found for: CKTOTAL, CKMB, CKMBINDEX, TROPONINI  Lab Results  Component Value Date   CHOL 254 (H) 03/23/2021   Lab Results  Component Value Date   HDL 52 03/23/2021   Lab Results  Component Value Date   LDLCALC 174 (H) 03/23/2021   Lab Results  Component Value Date   TRIG 142 03/24/2021   TRIG 138 03/23/2021   Lab Results  Component Value Date   CHOLHDL 4.9 03/23/2021   No results found for: LDLDIRECT    Radiology: DG Chest 1 View  Result Date: 03/23/2021 CLINICAL DATA:  51 year old male status post cardiopulmonary arrest, out of hospital CPR. Altered mental status. EXAM: CHEST  1 VIEW COMPARISON:  Portable chest 0308 hours. FINDINGS: Portable AP upright view at 0458 hours. Endotracheal tube tip in good position between the clavicles and carina. Enteric tube terminates in the left upper quadrant. Pacer pads over the left lower chest. Normal cardiac size and mediastinal contours. Allowing for portable technique the lungs are clear. Visible bowel-gas within normal limits. No displaced rib fracture. IMPRESSION: 1. Satisfactory ET tube and  enteric tube. 2.  No acute cardiopulmonary abnormality. Electronically Signed   By: Genevie Ann M.D.   On: 03/23/2021 06:05   DG Abd 1 View  Result Date: 03/23/2021 CLINICAL DATA:  Evaluate OG tube placement. EXAM: ABDOMEN - 1 VIEW COMPARISON:  None. FINDINGS: The OG tube courses below the level of the GE junction. The tip of the OG tube is in the expected location of the gastric fundus with side port well below the GE junction. Single borderline dilated loop of small bowel noted in the left lower quadrant of the abdomen with normal caliber colon. IMPRESSION: OG tube tip is in the gastric fundus with side port well below the GE junction. Electronically Signed   By: Kerby Moors M.D.   On: 03/23/2021 05:16   CT HEAD WO  CONTRAST (5MM)  Result Date: 03/29/2021 CLINICAL DATA:  Seizure disorder, clinical change EXAM: CT HEAD WITHOUT CONTRAST TECHNIQUE: Contiguous axial images were obtained from the base of the skull through the vertex without intravenous contrast. RADIATION DOSE REDUCTION: This exam was performed according to the departmental dose-optimization program which includes automated exposure control, adjustment of the mA and/or kV according to patient size and/or use of iterative reconstruction technique. COMPARISON:  03/23/2021 CT head, correlation is also made with MRI 03/24/2021 FINDINGS: Brain: General preservation of gray white differentiation, with mild sulcal crowding, similar to 03/23/2021. No new hypodensity to suggest acute infarct. No acute hemorrhage, mass, mass effect, or midline shift. No hydrocephalus or extra-axial collection. Vascular: No hyperdense vessel Skull: Normal. Negative for fracture or focal lesion. Sinuses/Orbits: Mucosal thickening in the right-greater-than-left maxillary sinuses, right-greater-than-left ethmoid air cells, and bilateral sphenoid sinuses. The orbits are unremarkable. Other: Fluid throughout the right mastoid air cells. Partial opacification of the left mastoid  air cells. The patient remains intubated. IMPRESSION: 1. Overall unchanged exam compared to 03/23/2021, with possible sulcal crowding but preservation of gray-white differentiation. If there is persistent concern for progression of anoxic injury, consider repeat MRI. 2. Mucosal thickening in the sinuses and fluid in the mastoid air cells, possibly related to intubation. Electronically Signed   By: Merilyn Baba M.D.   On: 03/29/2021 21:24   CT HEAD WO CONTRAST (5MM)  Result Date: 03/23/2021 CLINICAL DATA:  51 year old male status post cardiopulmonary arrest, out of hospital CPR. Altered mental status. EXAM: CT HEAD WITHOUT CONTRAST TECHNIQUE: Contiguous axial images were obtained from the base of the skull through the vertex without intravenous contrast. RADIATION DOSE REDUCTION: This exam was performed according to the departmental dose-optimization program which includes automated exposure control, adjustment of the mA and/or kV according to patient size and/or use of iterative reconstruction technique. COMPARISON:  None. FINDINGS: Brain: Small round hypodensity at the genu of the left internal capsule on series 2, image 15. Elsewhere gray-white matter differentiation is preserved and within normal limits. However, there is questionable generalized loss of cerebral sulci. But basilar cisterns appear normal. Normal cerebral volume. No midline shift, ventriculomegaly, mass effect, evidence of mass lesion, intracranial hemorrhage or evidence of cortically based acute infarction. Vascular: Mild generalized intracranial vascular hyperdensity, perhaps related to hemoconcentration. No suspicious intracranial vascular hyperdensity. Skull: No acute osseous abnormality identified. Sinuses/Orbits: Intubated on the scout view. Fluid in the visible pharynx. Right mastoid effusion. Right tympanic cavity remains clear. Mild left mastoid effusion. Paranasal sinuses remain well aerated. Other: No acute orbit or scalp soft  tissue finding. IMPRESSION: 1. Difficult without a prior study to exclude the possibility of anoxic injury; questionable loss of cerebral sulci throughout the brain, but the gray-white matter differentiation is preserved. If decreased mental status persists then repeat Head CT in 12-24 hours or noncontrast brain MRI would be valuable. 2. 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 No intracranial hemorrhage or mass effect. 3. Intubated, with mastoid air cell effusions. Electronically Signed   By: Genevie Ann M.D.   On: 03/23/2021 06:04   MR BRAIN WO CONTRAST  Result Date: 03/24/2021 CLINICAL DATA:  Anoxic brain damage. EXAM: MRI HEAD WITHOUT CONTRAST TECHNIQUE: Multiplanar, multiecho pulse sequences of the brain and surrounding structures were obtained without intravenous contrast. COMPARISON:  Head CT March 23, 2021 FINDINGS: Brain: Cortical restricted diffusion along the bilateral ACA-MCA watershed, bilateral occipital lobes, posterior cingulate, bilateral caudate and lentiform nucleus, particularly posterior aspect of the putamen, consistent with hypoxic ischemic injury.  Questionable restricted diffusion in the tail of left hippocampus. Punctate foci of restricted diffusion are also seen in the left cerebellar hemisphere, right posterior temporal lobe, medial right frontal lobe and left occipital lobe. Posterior fossa structures are otherwise maintained. No significant mass effect, hydrocephalus, hemorrhage, extra-axial collection or mass lesion. Remote lacunar infarct at the genu of the left internal capsule. A few scattered foci of T2 hyperintensity within the white matter of the cerebral hemispheres and periventricular white matter of the left temporal lobe, nonspecific. Vascular: Normal flow voids. Skull and upper cervical spine: No focal marrow lesion. Sinuses/Orbits: Fluid level within the right maxillary sinus and bilateral mastoid effusion may be related to endotracheal tube. The orbits are  maintained. Other: None. 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 Electronically Signed   By: Pedro Earls M.D.   On: 03/24/2021 13:53   CARDIAC CATHETERIZATION  Result Date: 03/23/2021   Dist RCA lesion is 20% stenosed.   There is mild left ventricular systolic dysfunction.   The left ventricular ejection fraction is 45-50% by visual estimate. 1.  Insignificant coronary artery disease 2.  Mild reduced left ventricular function with estimated LV ejection fraction 45 to 50% Recommendations 1.  Admit to ICU for ventilator management 2.  2D echocardiogram   DG Chest Port 1 View  Result Date: 03/26/2021 CLINICAL DATA:  Hypoxia. EXAM: PORTABLE CHEST 1 VIEW COMPARISON:  Chest XR, 03/25/2021. FINDINGS: Support lines: ETT within the midthoracic trachea, 7 cm from carina. Esophageal temperature probe. Enteric feeding tube with tip and side port within stomach. Overlying cardiac leads. Cardiac silhouette is within normal limits given technique and degree of inflation. Hypoinflation. Bilateral perihilar and basilar streaky interstitial opacities. No pleural effusion or pneumothorax. No interval osseous abnormality. IMPRESSION: 1. Well-positioned ET tube, esophageal temperature probe and enteric feeding tube. 2. Hypoinflation with minimal bilateral atelectasis. Electronically Signed   By: Michaelle Birks M.D.   On: 03/26/2021 09:38   DG Chest Port 1 View  Result Date: 03/25/2021 CLINICAL DATA:  Acute respiratory failure with hypoxia. EXAM: PORTABLE CHEST 1 VIEW COMPARISON:  03/24/2021 FINDINGS: ET tube tip is above the carina. There is a NG tube with tip and side port below the GE junction. Temperature probe is noted with tip terminating in the midesophagus. Heart size normal. No pleural effusion or edema. Lung volumes are low. No airspace opacities. IMPRESSION: 1. Support apparatus  positioned as above. 2. No acute cardiopulmonary abnormalities noted. Electronically Signed   By: Kerby Moors M.D.   On: 03/25/2021 08:04   DG Chest Port 1 View  Result Date: 03/24/2021 CLINICAL DATA:  Acute respiratory failure, hypoxia EXAM: PORTABLE CHEST 1 VIEW COMPARISON:  Chest radiograph 1 day prior FINDINGS: The endotracheal tube tip is approximately 3.1 cm from the carina. The enteric catheter is coiled in the stomach. A presumed esophageal temperature probe terminates in the mid thorax. The cardiomediastinal silhouette is stable. Lung aeration is unchanged. There is no new or worsening focal airspace disease. There is no pleural effusion or pneumothorax. There is a mildly displaced fracture of the left fifth rib possibly related to CPR. IMPRESSION: Mildly displaced fracture of the left fifth rib which may be related to CPR. Otherwise, stable chest with no radiographic evidence of acute cardiopulmonary process. Electronically Signed   By: Valetta Mole M.D.   On: 03/24/2021 07:21   DG Chest Portable 1 View  Result Date: 03/23/2021 CLINICAL DATA:  Status post intubation. EXAM: PORTABLE CHEST 1 VIEW COMPARISON:  None. FINDINGS: Overlying radiopaque cardiac lead wires and tags are seen. An endotracheal tube is seen with its distal tip approximately 0.9 cm from the carina. Mildly decreased lung volumes are noted. Mild atelectasis and/or infiltrate is seen along the infrahilar region of the right lung base. There is no evidence of a pleural effusion or pneumothorax. The heart size and mediastinal contours are within normal limits. The visualized skeletal structures are unremarkable. IMPRESSION: 1. Endotracheal tube with its distal tip approximately 0.9 cm from the carina. 2. Mild right basilar atelectasis and/or infiltrate. Electronically Signed   By: Virgina Norfolk M.D.   On: 03/23/2021 03:30   EEG adult  Result Date: 03/29/2021 Lora Havens, MD     03/29/2021  1:36 PM This patient Name:  Billy Cherry MRN: [id number] Epilepsy Attending: Lora Havens Referring Physician/Provider: Bradly Bienenstock, NP Date: 03/29/2021 Duration: 31.59 mins Patient history: 51 year old male status post cardiac arrest now noted to have eye twitching/remor like movements and rhythmic lip & tongue fasciculations.  EEG evaluate for seizure. Level of alertness: comatose AEDs during EEG study: LEV Technical aspects: This EEG study was done with scalp electrodes positioned according to the 10-20 International system of electrode placement. Electrical activity was acquired at a sampling rate of 500Hz  and reviewed with a high frequency filter of 70Hz  and a low frequency filter of 1Hz . EEG data were recorded continuously and digitally stored. Description: EEG showed continuous generalized 5 to 7 Hz theta slowing.  Generalized periodic discharges were also noted at 1.5 to 2 Hz. Patient was noted to have episodes of eye opening and lip twitching during the study, more prominent when stimulated. Concomitant EEG showed generalized spikes consistent with myoclonic seizure. Hyperventilation and photic stimulation were not performed.   ABNORMALITY -Myoclonic seizure, generalized -Continuous slow, generalized IMPRESSION: This study showed episodes of eye opening and lip twitching, more prominent when stimulated consistent with myoclonic seizures.  Additionally EEG was suggestive of severe diffuse encephalopathy.  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 Critical care team and Dr. Cheral Marker was notified. Lora Havens   EEG adult  Result Date: 03/27/2021 Lora Havens, MD     03/27/2021  7:46 PM Patient Name: Billy Cherry MRN: [id number] Epilepsy Attending: Lora Havens Referring Physician/Provider: Derek Jack, MD Date: 03/27/2021 Duration: 22.26 mins Patient history: 51 year old gentleman with history of hypertension who is admitted after cardiac arrest. EEG to  evaluate for seizure Level of alertness: lethargic AEDs during EEG study: LEV Technical aspects: This EEG study was done with scalp electrodes positioned according to the 10-20 International system of electrode placement. Electrical activity was acquired at a sampling rate of 500Hz  and reviewed with a high frequency filter of 70Hz  and a low frequency filter of 1Hz . EEG data were recorded continuously and digitally stored. Description: EEG showed near continuous generalized 3 to 5 Hz theta-delta slowing admixed with 1-3 seconds of intermittent eeg attenuation. Generalized spikes were noted intermittently. Hyperventilation and photic stimulation were not performed.   ABNORMALITY - Spike,generalized - Continuous slow, generalized IMPRESSION: This study showed evidence of epileptogenicity with generalized onset. There is also moderate diffuse encephalopathy, nonspecific etiology. No seizures were seen throughout the recording. Lora Havens   EEG adult  Result Date: 03/23/2021 Derek Jack, MD     03/23/2021  7:35 PM Routine EEG Report Graceson Hebda is a 51 y.o. male with a  history of cardiac arrest who is undergoing an EEG to evaluate for seizures. Report: This EEG was acquired with electrodes placed according to the International 10-20 electrode system (including Fp1, Fp2, F3, F4, C3, C4, P3, P4, O1, O2, T3, T4, T5, T6, A1, A2, Fz, Cz, Pz). The following electrodes were missing or displaced: none. There was no clear occipital dominant rhythm. The best background was low amplitude theta which was reactive to stimulation. At the beginning of the recording propofol had just been paused and there were intervening periods of diffuse suppression. Towards the end of the recording as propofol wore off background became near continuous. There was no focal slowing. There was no sleep architecture observed. There were no interictal epileptiform discharges. There were no electrographic seizures identified. Photic  stimulation and hyperventilation were not performed. Impression and clinical correlation: This EEG was obtained while comatose and is abnormal due to: - Moderate diffuse slowing indicative of global cerebral dysfunction - Initial burst suppression pattern which abated as propofol wore off and is favored to be largely (or entirely) secondary to medication effect Su Monks, MD [facility] [phone] If 7pm- 7am, please page neurology on call as listed in [place].   ECHOCARDIOGRAM COMPLETE  Result Date: 03/23/2021    ECHOCARDIOGRAM REPORT   Patient Name:   Billy Cherry Date of Exam: 03/23/2021 Medical Rec #:  [id number]       Height:       71.0 in Accession #:    [id number]      Weight:       241.0 lb Date of Birth:  12-15-1970       BSA:          2.283 m Patient Age:    78 years        BP:           127/96 mmHg Patient Gender: M               HR:           105 bpm. Exam Location:  [facility] Procedure: 2D Echo, Color Doppler and Cardiac Doppler Indications:     I46.9 Cardiac arrest  History:         Patient has no prior history of Echocardiogram examinations.                  Risk Factors:Hypertension.  Sonographer:     Charmayne Sheer Referring Phys:  [employer] Diagnosing Phys: Serafina Royals MD  Sonographer Comments: Echo performed with patient supine and on artificial respirator. IMPRESSIONS  1. Left ventricular ejection fraction, by estimation, is <20%. The left ventricle has severely decreased function. The left ventricle demonstrates global hypokinesis. The left ventricular internal cavity size was mildly dilated. There is mild left ventricular hypertrophy. Left ventricular diastolic parameters are consistent with Grade II diastolic dysfunction (pseudonormalization).  2. Right ventricular systolic function is normal. The right ventricular size is normal.  3. The mitral valve is normal in structure. Trivial mitral valve regurgitation.  4. The aortic valve is normal in structure. Aortic  valve regurgitation is not visualized. FINDINGS  Left Ventricle: Left ventricular ejection fraction, by estimation, is <20%. The left ventricle has severely decreased function. The left ventricle demonstrates global hypokinesis. The left ventricular internal cavity size was mildly dilated. There is mild left ventricular hypertrophy. Left ventricular diastolic parameters are consistent with Grade II diastolic dysfunction (pseudonormalization). Right Ventricle: The right ventricular size is normal. No increase in right ventricular wall thickness. Right ventricular  systolic function is normal. Left Atrium: Left atrial size was normal in size. Right Atrium: Right atrial size was normal in size. Pericardium: There is no evidence of pericardial effusion. Mitral Valve: The mitral valve is normal in structure. Trivial mitral valve regurgitation. MV peak gradient, 3.5 mmHg. The mean mitral valve gradient is 2.0 mmHg. Tricuspid Valve: The tricuspid valve is normal in structure. Tricuspid valve regurgitation is trivial. Aortic Valve: The aortic valve is normal in structure. Aortic valve regurgitation is not visualized. Aortic valve mean gradient measures 2.0 mmHg. Aortic valve peak gradient measures 3.7 mmHg. Aortic valve area, by VTI measures 3.03 cm. Pulmonic Valve: The pulmonic valve was normal in structure. Pulmonic valve regurgitation is not visualized. Aorta: The aortic root and ascending aorta are structurally normal, with no evidence of dilitation. IAS/Shunts: No atrial level shunt detected by color flow Doppler.  LEFT VENTRICLE PLAX 2D LVIDd:         5.81 cm      Diastology LVIDs:         5.17 cm      LV e' medial:    4.68 cm/s LV PW:         1.52 cm      LV E/e' medial:  7.6 LV IVS:        1.01 cm      LV e' lateral:   3.26 cm/s LVOT diam:     2.30 cm      LV E/e' lateral: 10.9 LV SV:         39 LV SV Index:   17 LVOT Area:     4.15 cm  LV Volumes (MOD) LV vol d, MOD A2C: 219.0 ml LV vol d, MOD A4C: 198.0 ml LV vol  s, MOD A2C: 156.0 ml LV vol s, MOD A4C: 153.0 ml LV SV MOD A2C:     63.0 ml LV SV MOD A4C:     198.0 ml LV SV MOD BP:      56.7 ml RIGHT VENTRICLE RV Basal diam:  3.99 cm RV S prime:     15.40 cm/s LEFT ATRIUM             Index        RIGHT ATRIUM           Index LA diam:        4.30 cm 1.88 cm/m   RA Area:     15.20 cm LA Vol (A2C):   55.4 ml 24.27 ml/m  RA Volume:   34.90 ml  15.29 ml/m LA Vol (A4C):   48.2 ml 21.11 ml/m LA Biplane Vol: 52.1 ml 22.82 ml/m  AORTIC VALVE                    PULMONIC VALVE AV Area (Vmax):    2.63 cm     PV Vmax:          0.87 m/s AV Area (Vmean):   2.53 cm     PV Vmean:         63.300 cm/s AV Area (VTI):     3.03 cm     PV VTI:           0.127 m AV Vmax:           96.50 cm/s   PV Peak grad:     3.0 mmHg AV Vmean:          67.100 cm/s  PV Mean grad:     2.0  mmHg AV VTI:            0.127 m      PR End Diast Vel: 6.66 msec AV Peak Grad:      3.7 mmHg AV Mean Grad:      2.0 mmHg LVOT Vmax:         61.10 cm/s LVOT Vmean:        40.800 cm/s LVOT VTI:          0.093 m LVOT/AV VTI ratio: 0.73  AORTA Ao Root diam: 3.30 cm MITRAL VALVE MV Area (PHT): 7.99 cm    SHUNTS MV Area VTI:   2.25 cm    Systemic VTI:  0.09 m MV Peak grad:  3.5 mmHg    Systemic Diam: 2.30 cm MV Mean grad:  2.0 mmHg MV Vmax:       0.94 m/s MV Vmean:      67.2 cm/s MV Decel Time: 95 msec MV E velocity: 35.60 cm/s MV A velocity: 84.00 cm/s MV E/A ratio:  0.42 Serafina Royals MD Electronically signed by Serafina Royals MD Signature Date/Time: 03/23/2021/1:02:27 PM    Final     EKG: Sinus rhythm 85 bpm with nonspecific ST abnormalities  Telemetry reviewed by me: NSR rate 82, no acute overnight events  Echocardiogram 03/23/2020  1. Left ventricular ejection fraction, by estimation, is <20%. The left  ventricle has severely decreased function. The left ventricle demonstrates  global hypokinesis. The left ventricular internal cavity size was mildly  dilated. There is mild left  ventricular hypertrophy. Left  ventricular diastolic parameters are  consistent with Grade II diastolic dysfunction (pseudonormalization).   2. Right ventricular systolic function is normal. The right ventricular  size is normal.   3. The mitral valve is normal in structure. Trivial mitral valve  regurgitation.   4. The aortic valve is normal in structure. Aortic valve regurgitation is  not visualized.   ASSESSMENT AND PLAN:   #Cardiopulmonary arrest #acute HFrEF (LVEF <20%, global hypokinesis) -Further history from the patient's wife revealed the etiology of his presentation could be from lisinopril or losartan induced angioedema causing hypoxia and subsequent events that followed. -S/p Levophed gtt. discontinued on 1/20.  Blood pressure has been labile over the weekend requiring doses of IV labetalol.  Sedation with propofol discontinued 1/22. -GDMT includes Coreg 25 mg twice daily, hydralazine 25 mg every 8 hours and continue imdur at 20mg  TID today for better BP control -No further cardiac diagnostics necessary at this time, consider repeat echocardiogram later in clinical course if necessary to assess for risk stratification.   -Neurology, GI, and ENT following, appreciate their recommendations  #Respiratory failure #VAP -Continues to require ventilatory support, supportive care as per primary team  #Essential hypertension -Do not restart ACE inhibitor or ARB due to suspected angioedema. -Other plan as above  #Acute kidney injury with underlying mild CKD -Cr 2.08 and GFR 38 on admission, continuing to improve.   This patient's plan of care was discussed and created with Dr. Lujean Amel and he is in agreement.  Signed: Tristan Schroeder, PA-C 03/30/2021, 9:01 AM

## 2021-03-30 NOTE — Consult Note (Signed)
PHARMACY CONSULT NOTE Pharmacy Consult for Electrolyte Monitoring and Replacement   Recent Labs: Potassium (mmol/L)  Date Value  03/30/2021 3.7   Magnesium (mg/dL)  Date Value  30/16/0109 2.4   Calcium (mg/dL)  Date Value  32/35/5732 9.1   Albumin (g/dL)  Date Value  20/25/4270 3.0 (L)   Phosphorus (mg/dL)  Date Value  62/37/6283 4.2   Sodium (mmol/L)  Date Value  03/30/2021 148 (H)   Assessment: 51 year old male with hypertension BIB EMS for STEMI found to be in Vfib on arrival. Patient converted to asystole after defibrillation by EMS, but subsequently with ROSC after CPR/epi. He was intubated in the ED. Patient was taken to the cath lab and was found to have no significant CAD and an LVEF <20%. Patient was then transferred to the CCU. Pharmacy has been consulted to monitor and replace electrolytes.   Nutrition: tube feeds + supplements BID, free water q4H  Goal of Therapy:  Potassium ~ 4, magnesium ~ 2, all other electrolytes within normal limits  Plan:  --Na 150>148 - on D5 + free water flushes --No other replacement indicated at this time --Continue to follow along  Pricilla Riffle, PharmD, BCPS Clinical Pharmacist 03/30/2021 2:12 PM

## 2021-03-30 NOTE — Progress Notes (Signed)
NAME:  Billy Cherry, MRN:  [id number], DOB:  [birth year], LOS: 7 ADMISSION DATE:  03/23/2021, CONSULTATION DATE:  1/19 REFERRING MD:  Lorinda Creed, CHIEF COMPLAINT:  Found down, cardiac arrest   Brief Pt Description / Synopsis:  51 year old male with out-of-hospital V. fib cardiac arrest in setting of respiratory arrest due to suspected angioedema from ACE inhibitor leading to asphyxiation. Cardiac catheterization without evidence of significant coronary artery disease. Now with concern for anoxic brain injury.  History of Present Illness:  51 y/o male was found down by his wife this evening with agonal respirations.  911 called, received out of hospital CPR, 1 shock with defibrillator, 2 doses of epinephrine.  Had return of spontaneous circulation.  Concern on his 12 lead EKG for anterolateral ST elevation so he was taken to the cath lab where his study showed no significant coronary artery disease and an LVEF 45-50%.   He remained encephalopathic post cardiac arrest so PCCM was consulted.    Family notes he rarely drinks alcohol, doesn't smoke, doesn't use illicit drugs.  Takes blood pressure medicine at home, may have run out recently.   Pertinent  Medical History  Hypertension  Micro Data:  1/19 SARS CoV2/Flu > negative 1/19: MRSA PCR>> negative 1/21: Tracheal aspirate>> normal respiratory flora 1/21: Blood culture x2>> no growth   Antimicrobials:  Ceftriaxone 1/21>>1/25  Significant Hospital Events: Including procedures, antibiotic start and stop dates in addition to other pertinent events   1/19 admission, left heart cath without significant CAD.  Concern for anoxic brain injury 1/20: MRI pending, Neuro following 1/21: MRI shows areas of hypoxic-ischemic injury bilaterally. Fevered; cultures obtained. 1/23 severe brain damage 1/24 evidence of severe brain damage, remains critically ill 1/25: Plan for PEG today. Neuro exam unchanged.  Does exhibit new eyelid tremor like movements  and lip/jaw tremor like vs clonus movement.  Bolused Keppra and increased maintenance dose.  Repeat EEG. 1/26: Repeat EEG unchanged and continues to show myoclonic seizures despite addition of Valproic acid yesterday. Will require transfer to [facility] for continuous EEG  Interim History / Subjective:  -No significant events reported overnight. -Repeat head CT last night overall with no change -Afebrile, hemodynamically stable, Clevidipine weaned off -Neuro exam unchanged (+ brainstem reflexes, but does not withdraw to pain) -Continues to have (less pronounced today) eye twitching/remor like movements and rhythmic lip & tongue fasciculations concerning for clonus movement vs. possible seizure activity ~ yesterday Keppra increased and Valproic acid added -Repeat EEG continues to show myoclonic seizures ~ Neurology recommends transfer to [facility] for continuous EEG -Leukocytosis virtually unchanged 13.4 (13.6), completed 5 day course of Ceftriaxone yesterday for suspected aspiration -Creatine 1.76 (1.68), UOP 3.1L last 24 hrs (-5L since admit) -Na+ slightly improved to 148 (150), will continue D5W and free water flushes   Objective   Blood pressure (!) 147/101, pulse 75, temperature 98.8 F (37.1 C), resp. rate 20, height 5' 10.98" (1.803 m), weight 101.8 kg, SpO2 99 %.    Vent Mode: PRVC FiO2 (%):  [30 %] 30 % Set Rate:  [20 bmp] 20 bmp Vt Set:  [500 mL] 500 mL PEEP:  [5 cmH20] 5 cmH20 Pressure Support:  [5 cmH20] 5 cmH20 Plateau Pressure:  [20 cmH20] 20 cmH20   Intake/Output Summary (Last 24 hours) at 03/30/2021 0830 Last data filed at 03/30/2021 0700 Gross per 24 hour  Intake 3338.88 ml  Output 3075 ml  Net 263.88 ml    Filed Weights   03/28/21 0500 03/29/21 0500 03/30/21 0416  Weight: 102.2 kg 102 kg 101.8 kg    Examination:  General: Acutely ill-appearing male, laying in bed, intubated, no acute distress HENT: NCAT ETT in place PULM: Clear to auscultation bilaterally,  no wheezing or rales noted, even, in PSV, no accessory muscle use CV: RRR, no mgr GI: BS+, soft, nontender MSK: normal bulk and tone, no deformities, no edema Neuro: Brainstem reflexes intact (positive cough/gag/corneal reflexes, overbreathing the ventilator), pupils PERRLA, however does not withdraw from painful stimulation, eyelid tremor like movements along with lip/jaw tremor vs. Clonus like movement  Resolved Hospital Problem list     Assessment & Plan:   V. Fib arrest, left heart cath without significant coronary artery disease ~ suspect pt may have developed mild Angioedema due to home Lisinopril leading to airway obstruction with respiratory arrest Acute HFrEF (LVEF <20%, global hypokinesis) Hypertensive  -Continuous cardiac monitoring -Continue  Coreg, hydralazine, and imdur  -NO ACEi or ARB due to suspected angioedema -Echocardiogram 03/23/21>> LVEF <20% with global hypokinesis, Grade II diastolic dysfunction, RV function normal -Cardiology following, appreciate input -Cardiac CATH without significant coronary artery disease -Urine drug screen negative -Serum Ethyl Alcohol negative   Acute Metabolic Encephalopathy post cardiac arrest Anoxic Brain Injury Myoclonic Seizures -Maintain a RASS goal of 0 to -1 -Neurology recommends starting Propofol to break Myoclonus -PRN versed -Avoid sedating medications as able -Daily wake up assessment -Completed Normothermia protocol -MRI Brain: consistent with hypoxic ischemic injury involving the bilateral ACA-MCA watershed cortex, bilateral occipital cortex and basal ganglia. -EEG with myoclonic seizures -Neurology following, appreciate input -Continue Keppra and Valproic acid -Will require transfer to [facility] for continuous EEG   Acute Hypoxic Respiratory Failure in the setting of cardiac arrest & Anoxic Brain Injury -Will need TRACHEOSTOMY for survival, ENT consulted ~ tentative plan for Trach next Tuesday 2/1 -Full vent  support, implement lung protective strategies -Plateau pressures less than 30 cm H20 -Wean FiO2 & PEEP as tolerated to maintain O2 sats >92% -Follow intermittent Chest X-ray & ABG as needed -Spontaneous Breathing Trials when respiratory parameters met and mental status permits -Implement VAP Bundle -Prn Bronchodilators   Acute Kidney Injury Hypernatremia Creatinine 2.08 on admission -Monitor I&O's / urinary output -Follow BMP -Ensure adequate renal perfusion -Avoid nephrotoxic agents as able -Replace electrolytes as indicated -Continue D5W and free water flushes   Leukocytosis, suspect due to Aspiration Pneumonia -Monitor fever curve -Trend WBC's  -Follow cultures as above -Completed 5 day course of Ceftriaxone   Hyperglycemia -CBG's q4h; Target range of 140 to 180 -SSI -Follow ICU Hypo/Hyperglycemia protocol      Pt is critically ill with anoxic brain injury.  Prognosis is guarded.  High risk for further cardiac arrest and death. Recommend DNR status.    Best Practice (right click and "Reselect all SmartList Selections" daily)   Diet/type: tubefeeds DVT prophylaxis: Lovenox GI prophylaxis: PPI Lines: N/A Foley:  Yes, and it is still needed Code Status:  full code Last date of multidisciplinary goals of care discussion [1/26]  Labs   CBC: Recent Labs  Lab 03/26/21 0534 03/27/21 0321 03/28/21 0538 03/29/21 0615 03/30/21 0428  WBC 21.2* 15.9* 13.3* 13.6* 13.4*  HGB 13.5 12.6* 12.3* 12.5* 12.7*  HCT 41.6 39.7 38.6* 39.9 40.0  MCV 92.7 93.2 93.7 95.5 94.8  PLT 199 214 245 254 277     Basic Metabolic Panel: Recent Labs  Lab 03/24/21 1700 03/25/21 0441 03/26/21 0534 03/27/21 0321 03/28/21 0538 03/29/21 0615 03/30/21 0428  NA  --    < >  142 148* 151* 150* 148*  K  --    < > 3.9 4.0 4.2 3.8 3.7  CL  --    < > 110 115* 116* 115* 111  CO2  --    < > $R'24 25 26 26 26  'eV$ GLUCOSE  --    < > 154* 129* 125* 144* 142*  BUN  --    < > 65* 74* 78* 60* 54*   CREATININE  --    < > 1.87* 1.74* 1.90* 1.68* 1.76*  CALCIUM  --    < > 8.7* 8.8* 9.0 9.2 9.1  MG 2.2  --   --  2.9* 2.8* 2.4 2.4  PHOS 5.0*  --   --  4.5 4.0 3.9 4.2   < > = values in this interval not displayed.    GFR: Estimated Creatinine Clearance: 61 mL/min (A) (by C-G formula based on SCr of 1.76 mg/dL (H)). Recent Labs  Lab 03/27/21 0321 03/28/21 0538 03/29/21 0615 03/30/21 0428  WBC 15.9* 13.3* 13.6* 13.4*     Liver Function Tests: Recent Labs  Lab 03/24/21 0513 03/25/21 0441 03/26/21 0534  AST 123* 56* 31  ALT 92* 63* 44  ALKPHOS 43 43 40  BILITOT 1.1 1.0 0.8  PROT 7.1 6.8 6.8  ALBUMIN 3.5 3.4* 3.0*    No results for input(s): LIPASE, AMYLASE in the last 168 hours. Recent Labs  Lab 03/29/21 1413  AMMONIA 38*    ABG    Component Value Date/Time   PHART 7.43 03/24/2021 0500   PCO2ART 36 03/24/2021 0500   PO2ART 111 (H) 03/24/2021 0500   HCO3 23.9 03/24/2021 0500   ACIDBASEDEF 0.3 03/23/2021 0556   O2SAT 98.5 03/24/2021 0500     Coagulation Profile: No results for input(s): INR, PROTIME in the last 168 hours.   Cardiac Enzymes: No results for input(s): CKTOTAL, CKMB, CKMBINDEX, TROPONINI in the last 168 hours.  HbA1C: Hgb A1c MFr Bld  Date/Time Value Ref Range Status  03/23/2021 06:23 AM 5.4 4.8 - 5.6 % Final    Comment:    (NOTE) Pre diabetes:          5.7%-6.4%  Diabetes:              >6.4%  Glycemic control for   <7.0% adults with diabetes   03/23/2021 02:54 AM 5.5 4.8 - 5.6 % Final    Comment:    DUPLICATE AT 8850 27/74/1287. THE NEXT SAMPLE WAS CREDITED. (NOTE) Pre diabetes:          5.7%-6.4%  Diabetes:              >6.4%  Glycemic control for   <7.0% adults with diabetes     CBG: Recent Labs  Lab 03/29/21 1607 03/29/21 1910 03/29/21 2329 03/30/21 0459 03/30/21 0810  GLUCAP 141* 130* 96 134* 122*     Review of Systems:   Cannot obtain due to intubation, unresponsiveness  Past Medical History:  He,  has  a past medical history of Hypertension.   Surgical History:   Past Surgical History:  Procedure Laterality Date   BACK SURGERY     CORONARY/GRAFT ACUTE MI REVASCULARIZATION N/A 03/23/2021   Procedure: Coronary/Graft Acute MI Revascularization;  Surgeon: Isaias Cowman, MD;  Location: [place] [facility];  Service: Cardiovascular;  Laterality: N/A;   LEFT HEART CATH AND CORONARY ANGIOGRAPHY N/A 03/23/2021   Procedure: LEFT HEART CATH AND CORONARY ANGIOGRAPHY;  Surgeon: Isaias Cowman, MD;  Location: [place] [facility];  Service: Cardiovascular;  Laterality: N/A;   SHOULDER SURGERY       Social History:   reports that he has never smoked. He has never used smokeless tobacco. He reports current alcohol use. He reports that he does not use drugs.   Family History:  His family history is not on file.   Allergies Allergies  Allergen Reactions   Lisinopril Swelling    angioedema     Home Medications  Prior to Admission medications   Medication Sig Start Date End Date Taking? Authorizing Provider  lisinopril (PRINIVIL,ZESTRIL) 10 MG tablet Take 10 mg by mouth daily.  11/12/16   [provider]  lisinopril (ZESTRIL) 10 MG tablet Take 1 tablet (10 mg total) by mouth daily. 12/06/19 12/05/20  Johnn Hai, PA-C     Critical care time: 40 minutes    Darel Hong, AGACNP-BC [facility] Prefer epic messenger for cross cover needs If after hours, please call E-link

## 2021-03-30 NOTE — Procedures (Signed)
Patient Name: Billy Cherry  MRN: [id number]  Epilepsy Attending: Lora Havens  Referring Physician/Provider: Dr Kerney Elbe Date: 03/30/2021 Duration: 23.18 mins   Patient history: 51 year old male status post cardiac arrest now noted to have eye twitching/remor like movements and rhythmic lip & tongue fasciculations.  EEG evaluate for seizure.   Level of alertness: comatose   AEDs during EEG study: LEV, VPA   Technical aspects: This EEG study was done with scalp electrodes positioned according to the 10-20 International system of electrode placement. Electrical activity was acquired at a sampling rate of 500Hz  and reviewed with a high frequency filter of 70Hz  and a low frequency filter of 1Hz . EEG data were recorded continuously and digitally stored.    Description: EEG showed continuous generalized 5 to 7 Hz theta slowing.  Generalized periodic discharges were also noted at 1.5 to 2 Hz.   Patient was noted to have episodes of eye opening and lip twitching during the study, more prominent when stimulated. Concomitant EEG showed generalized spikes consistent with myoclonic seizure.    Hyperventilation and photic stimulation were not performed.      ABNORMALITY -Myoclonic seizure, generalized -Continuous slow, generalized   IMPRESSION: This study showed episodes of eye opening and lip twitching, more prominent when stimulated consistent with myoclonic seizures.  Additionally EEG was suggestive of severe diffuse encephalopathy.  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   EEG appears similar to previous day.   Rachelle Edwards Barbra Sarks

## 2021-03-30 NOTE — H&P (Signed)
NAME:  Billy Cherry, MRN:  [id number], DOB:  Mar 26, 1970, LOS: 0 ADMISSION DATE:  03/30/2021, CONSULTATION DATE:  1/19 REFERRING MD:  Lorinda Creed, CHIEF COMPLAINT:  Found down, cardiac arrest   Brief Pt Description / Synopsis:  51 year old male with out-of-hospital V. fib cardiac arrest in setting of respiratory arrest due to suspected angioedema from ACE inhibitor leading to asphyxiation. Cardiac catheterization without evidence of significant coronary artery disease. Now with concern for anoxic brain injury.  He remains critically ill. Hospital day 7. Day 0 at [facility].  History of Present Illness:  51 y/o male was found down by his wife this evening with agonal respirations.  911 called, received out of hospital CPR, 1 shock with defibrillator, 2 doses of epinephrine.  Had return of spontaneous circulation.  Concern on his 12 lead EKG for anterolateral ST elevation so he was taken to the cath lab where his study showed no significant coronary artery disease and an LVEF 45-50%.   He remained encephalopathic post cardiac arrest so PCCM was consulted.    Family notes he rarely drinks alcohol, doesn't smoke, doesn't use illicit drugs.  Takes blood pressure medicine at home, may have run out recently.    Pertinent  Medical History  Hypertension  Micro Data:  1/19 SARS CoV2/Flu > negative 1/19: MRSA PCR>> negative 1/21: Tracheal aspirate>> normal respiratory flora 1/21: Blood culture x2>> no growth   Antimicrobials:  Ceftriaxone 1/21>>1/25  Significant Hospital Events: Including procedures, antibiotic start and stop dates in addition to other pertinent events   1/19 admission, left heart cath without significant CAD.  Concern for anoxic brain injury 1/20: MRI pending, Neuro following 1/21: MRI shows areas of hypoxic-ischemic injury bilaterally. Fevered; cultures obtained. 1/23 severe brain damage 1/24 evidence of severe brain damage, remains critically ill 1/25: Plan for PEG today. Neuro  exam unchanged.  Does exhibit new eyelid tremor like movements and lip/jaw tremor like vs clonus movement.  Bolused Keppra and increased maintenance dose.  Repeat EEG. CTH: concerning for progression of anoxic injury.  1/26: Repeat EEG unchanged and continues to show myoclonic seizures despite addition of Valproic acid yesterday. transfer to [facility] for continuous EEG  Interim History / Subjective:  Transferred to [facility] for eeg  Unable to obtain subjective evaluation due to patient status Objective   There were no vitals taken for this visit.    Vent Mode: PRVC FiO2 (%):  [30 %] 30 % Set Rate:  [20 bmp] 20 bmp Vt Set:  [500 mL] 500 mL PEEP:  [5 cmH20] 5 cmH20 Pressure Support:  [5 cmH20] 5 cmH20 Plateau Pressure:  [20 cmH20] 20 cmH20  No intake or output data in the 24 hours ending 03/30/21 1626  There were no vitals filed for this visit.   Examination: General: In bed, NAD, appears comfortable HEENT: MM pink/moist, anicteric, atraumatic Neuro: RASS -5, PERRL 20mm, Eyes blinking frequently, no movement to stimulation CV: S1S2, NSR, no m/r/g appreciated PULM:  rhonchi  in the upper lobes, rhonchi  in the lower lobes, trachea midline, chest expansion symmetric GI: soft, bsx4 active, non-tender , flexi seal in place with brown stool Extremities: warm/dry, no pretibial edema, capillary refill less than 3 seconds  Skin:  no rashes or lesions noted  Labs: WBC 13.6> 13.4 HGB 12.5>12.7 NA 150>148 BUN 60>54, Creat 1.68> 1.76 1/25 CTH: concerning for progression of anoxic injury.   Resolved Hospital Problem list     Assessment & Plan:  V. Fib arrest, left heart cath without significant coronary artery  disease ~ suspect pt may have developed mild Angioedema due to home Lisinopril leading to airway obstruction with respiratory arrest Acute HFrEF (LVEF <20%, global hypokinesis) Hypertension Echocardiogram 03/23/21>> LVEF <20% with global hypokinesis, Grade II diastolic dysfunction, RV  function normal. UDS negative. ETOH negative. Cardiac cath without significant CAD. -Cardiology consulted and following at [facility]. Will re-engage if needed -Continue coreg, hydralazine, and imdur -Continue continuous tele and spo2 monitoring -Continue cleviprex -Do not restart ACEi or ARB.    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 MRI brain consistent with hypoxic ischemic injury involving bilateral ACA-MCA watershed cortex, bilateral occipital cortex and basal ganglia. 1/25 CTH: concerning for progression of anoxic injury.  -Neuro consulted and following. Transferred to [facility] for Eef -Continue Keppra and valproic acid -Continue neuroprotective measures- normothermia, euglycemia, HOB greater than 30, head in neutral alignment, normocapnia, normoxia.  -Goal RASS as below. Continue neuro checks.    Acute Hypoxic Respiratory Failure in the setting of cardiac arrest & Anoxic Brain Injury -LTVV strategy with tidal volumes of 4-8 cc/kg ideal body weight -Goal plateau pressures less than 30 and driving pressures less than 15 -Wean PEEP/FiO2 for SpO2 92-98% -VAP bundle -Daily SAT and SBT -PAD bundle with fentanyl PRN. -RASS goal 0 to -1 -Follow intermittent CXR and ABG PRN -Continue PRN Bds   Acute Kidney Injury Hypernatremia Creatinine 2.08 on admission. Creat 1.68>1.76. NA 150>148. -Ensure renal perfusion. Goal MAP 65 or greater. -Avoid neprotoxic drugs as possible. -Strict I&O's -Follow up AM creatinine -Continue free water flushes. 149ml q4h.   Leukocytosis, suspect due to Aspiration Pneumonia S/P 5 day course of ceftriaxone -Follow cultures -Trend WBC/Fever curve   Hyperglycemia -Blood Glucose goal 140-180. -SSI   Best Practice (right click and "Reselect all SmartList Selections" daily)   Diet/type: tubefeeds DVT prophylaxis: Lovenox GI prophylaxis: PPI Lines: N/A Foley:  Yes, and it is still  needed Code Status:  full code Last date of multidisciplinary goals of care discussion [1/26]  Labs   CBC: Recent Labs  Lab 03/26/21 0534 03/27/21 0321 03/28/21 0538 03/29/21 0615 03/30/21 0428  WBC 21.2* 15.9* 13.3* 13.6* 13.4*  HGB 13.5 12.6* 12.3* 12.5* 12.7*  HCT 41.6 39.7 38.6* 39.9 40.0  MCV 92.7 93.2 93.7 95.5 94.8  PLT 199 214 245 254 [id number]    Basic Metabolic Panel: Recent Labs  Lab 03/24/21 1700 03/25/21 0441 03/26/21 0534 03/27/21 0321 03/28/21 0538 03/29/21 0615 03/30/21 0428  NA  --    < > 142 148* 151* 150* 148*  K  --    < > 3.9 4.0 4.2 3.8 3.7  CL  --    < > 110 115* 116* 115* 111  CO2  --    < > 24 25 26 26 26   GLUCOSE  --    < > 154* 129* 125* 144* 142*  BUN  --    < > 65* 74* 78* 60* 54*  CREATININE  --    < > 1.87* 1.74* 1.90* 1.68* 1.76*  CALCIUM  --    < > 8.7* 8.8* 9.0 9.2 9.1  MG 2.2  --   --  2.9* 2.8* 2.4 2.4  PHOS 5.0*  --   --  4.5 4.0 3.9 4.2   < > = values in this interval not displayed.   GFR: Estimated Creatinine Clearance: 61 mL/min (A) (by C-G formula based on SCr of 1.76 mg/dL (H)). Recent Labs  Lab 03/27/21 0321 03/28/21 [id number] 03/29/21 [id number]  03/30/21 0428  WBC 15.9* 13.3* 13.6* 13.4*    Liver Function Tests: Recent Labs  Lab 03/24/21 0513 03/25/21 0441 03/26/21 0534  AST 123* 56* 31  ALT 92* 63* 44  ALKPHOS 43 43 40  BILITOT 1.1 1.0 0.8  PROT 7.1 6.8 6.8  ALBUMIN 3.5 3.4* 3.0*   No results for input(s): LIPASE, AMYLASE in the last 168 hours. Recent Labs  Lab 03/29/21 1413  AMMONIA 38*    ABG    Component Value Date/Time   PHART 7.43 03/24/2021 0500   PCO2ART 36 03/24/2021 0500   PO2ART 111 (H) 03/24/2021 0500   HCO3 23.9 03/24/2021 0500   ACIDBASEDEF 0.3 03/23/2021 0556   O2SAT 98.5 03/24/2021 0500     Coagulation Profile: No results for input(s): INR, PROTIME in the last 168 hours.   Cardiac Enzymes: No results for input(s): CKTOTAL, CKMB, CKMBINDEX, TROPONINI in the last 168 hours.  HbA1C: Hgb  A1c MFr Bld  Date/Time Value Ref Range Status  03/23/2021 06:23 AM 5.4 4.8 - 5.6 % Final    Comment:    (NOTE) Pre diabetes:          5.7%-6.4%  Diabetes:              >6.4%  Glycemic control for   <7.0% adults with diabetes   03/23/2021 02:54 AM 5.5 4.8 - 5.6 % Final    Comment:    DUPLICATE AT [id number]. THE NEXT SAMPLE WAS CREDITED. (NOTE) Pre diabetes:          5.7%-6.4%  Diabetes:              >6.4%  Glycemic control for   <7.0% adults with diabetes     CBG: Recent Labs  Lab 03/29/21 1910 03/29/21 2329 03/30/21 0459 03/30/21 0810 03/30/21 1143  GLUCAP 130* 96 134* 122* 155*    Review of Systems:   Unable to obtain ROS due to patient status  Past Medical History:  He,  has a past medical history of Hypertension.   Surgical History:   Past Surgical History:  Procedure Laterality Date   BACK SURGERY     CORONARY/GRAFT ACUTE MI REVASCULARIZATION N/A 03/23/2021   Procedure: Coronary/Graft Acute MI Revascularization;  Surgeon: Isaias Cowman, MD;  Location: [facility];  Service: Cardiovascular;  Laterality: N/A;   LEFT HEART CATH AND CORONARY ANGIOGRAPHY N/A 03/23/2021   Procedure: LEFT HEART CATH AND CORONARY ANGIOGRAPHY;  Surgeon: Isaias Cowman, MD;  Location: [facility];  Service: Cardiovascular;  Laterality: N/A;   SHOULDER SURGERY       Social History:   reports that he has never smoked. He has never used smokeless tobacco. He reports current alcohol use. He reports that he does not use drugs.   Family History:  His family history is not on file.   Allergies Allergies  Allergen Reactions   Lisinopril Swelling    angioedema     Home Medications  Prior to Admission medications   Medication Sig Start Date End Date Taking? Authorizing Provider  lisinopril (PRINIVIL,ZESTRIL) 10 MG tablet Take 10 mg by mouth daily.  11/12/16   [provider]  lisinopril (ZESTRIL) 10 MG tablet Take 1 tablet (10 mg total)  by mouth daily. 12/06/19 12/05/20  Johnn Hai, PA-C     Critical care time: 35 minutes     Redmond School., MSN, APRN, AGACNP-BC [facility]  03/30/2021 , 4:26 PM  Please see [email or web address] for  pager details  If no response, please call [phone] After hours, please call Elink at [phone]

## 2021-03-30 NOTE — Progress Notes (Addendum)
Subjective: No changes overnight. EEG leads being placed.   Objective: Current vital signs: BP (!) 147/101    Pulse 75    Temp 98.8 F (37.1 C)    Resp 20    Ht 5' 10.98" (1.803 m)    Wt 101.8 kg    SpO2 99%    BMI 31.32 kg/m  Vital signs in last 24 hours: Temp:  [97.7 F (36.5 C)-100.6 F (38.1 C)] 98.8 F (37.1 C) (01/26 0640) Pulse Rate:  [68-96] 75 (01/26 0640) Resp:  [8-37] 20 (01/26 0640) BP: (99-178)/(59-119) 147/101 (01/26 0640) SpO2:  [91 %-100 %] 99 % (01/26 0752) FiO2 (%):  [30 %] 30 % (01/26 0752) Weight:  [101.8 kg] 101.8 kg (01/26 0416)  Intake/Output from previous day: 01/25 0701 - 01/26 0700 In: 4169.1 [P.O.:480; I.V.:2399.5; NG/GT:545.3; IV Piggyback:744.3] Out: 3800 [Urine:3175; Stool:625] Intake/Output this shift: No intake/output data recorded. Nutritional status:  Diet Order             Diet NPO time specified  Diet effective now                   HEENT: [place]/AT. No neck stiffness Lungs: Intubated Ext: Warm and well perfused   Neurologic Exam:  Ment: Eyes are noted to be open when examiner enters the room. Blinks spasmodically and at a faster rate than normal. No response to loud clapping. Spontaneously chews on ETT. Does not gaze or turn head towards or away from visual or auditory stimuli. No attempts to communicate. No purposeful movements.  CN: Pupils 1-2 mm and asymmetric, smaller on the left, with sluggish reactivity on the right. No blink to threat. Eyes slightly dysconjugate. Weak doll's eye reflex. Weak corneal reflexes. Face symmetric while intermittently chewing on ETT. Low amplitude twitching of lips is noted continuously during the bedside exam.   Motor/Sensory: BUE with flaccid tone and no movement to noxious.  BLE with flaccid tone. No movement to noxious.  Reflexes: Hypoactive x 4 Cerebellar/Gait: Unable to test.   Lab Results: Results for orders placed or performed during the hospital encounter of 03/23/21 (from the past 48  hour(s))  Glucose, capillary     Status: Abnormal   Collection Time: 03/28/21 11:25 AM  Result Value Ref Range   Glucose-Capillary 143 (H) 70 - 99 mg/dL    Comment: Glucose reference range applies only to samples taken after fasting for at least 8 hours.  Glucose, capillary     Status: Abnormal   Collection Time: 03/28/21  4:14 PM  Result Value Ref Range   Glucose-Capillary 134 (H) 70 - 99 mg/dL    Comment: Glucose reference range applies only to samples taken after fasting for at least 8 hours.  Glucose, capillary     Status: Abnormal   Collection Time: 03/28/21  8:06 PM  Result Value Ref Range   Glucose-Capillary 113 (H) 70 - 99 mg/dL    Comment: Glucose reference range applies only to samples taken after fasting for at least 8 hours.  Glucose, capillary     Status: None   Collection Time: 03/29/21  4:11 AM  Result Value Ref Range   Glucose-Capillary 98 70 - 99 mg/dL    Comment: Glucose reference range applies only to samples taken after fasting for at least 8 hours.  CBC     Status: Abnormal   Collection Time: 03/29/21  6:15 AM  Result Value Ref Range   WBC 13.6 (H) 4.0 - 10.5 K/uL   RBC 4.18 (L)  4.22 - 5.81 MIL/uL   Hemoglobin 12.5 (L) 13.0 - 17.0 g/dL   HCT 39.9 39.0 - 52.0 %   MCV 95.5 80.0 - 100.0 fL   MCH 29.9 26.0 - 34.0 pg   MCHC 31.3 30.0 - 36.0 g/dL   RDW 14.1 11.5 - 15.5 %   Platelets 254 150 - 400 K/uL   nRBC 0.0 0.0 - 0.2 %    Comment: Performed at [facility], [address] [place], [place] [address]  Basic metabolic panel     Status: Abnormal   Collection Time: 03/29/21  6:15 AM  Result Value Ref Range   Sodium 150 (H) 135 - 145 mmol/L   Potassium 3.8 3.5 - 5.1 mmol/L   Chloride 115 (H) 98 - 111 mmol/L   CO2 26 22 - 32 mmol/L   Glucose, Bld 144 (H) 70 - 99 mg/dL    Comment: Glucose reference range applies only to samples taken after fasting for at least 8 hours.   BUN 60 (H) 6 - 20 mg/dL   Creatinine, Ser 1.68 (H) 0.61 - 1.24 mg/dL   Calcium  9.2 8.9 - 10.3 mg/dL   GFR, Estimated 49 (L) >60 mL/min    Comment: (NOTE) Calculated using the CKD-EPI Creatinine Equation (2021)    Anion gap 9 5 - 15    Comment: Performed at [facility], [address], [place], [place] [address]  Magnesium     Status: None   Collection Time: 03/29/21  6:15 AM  Result Value Ref Range   Magnesium 2.4 1.7 - 2.4 mg/dL    Comment: Performed at [facility], [address] [place], [place] [address]  Phosphorus     Status: None   Collection Time: 03/29/21  6:15 AM  Result Value Ref Range   Phosphorus 3.9 2.5 - 4.6 mg/dL    Comment: Performed at [facility], [address] [place], [place] [address]  Glucose, capillary     Status: Abnormal   Collection Time: 03/29/21  7:21 AM  Result Value Ref Range   Glucose-Capillary 129 (H) 70 - 99 mg/dL    Comment: Glucose reference range applies only to samples taken after fasting for at least 8 hours.  Glucose, capillary     Status: Abnormal   Collection Time: 03/29/21 12:07 PM  Result Value Ref Range   Glucose-Capillary 132 (H) 70 - 99 mg/dL    Comment: Glucose reference range applies only to samples taken after fasting for at least 8 hours.  Ammonia     Status: Abnormal   Collection Time: 03/29/21  2:13 PM  Result Value Ref Range   Ammonia 38 (H) 9 - 35 umol/L    Comment: Performed at [facility], [address] [place], [place] [address]  Glucose, capillary     Status: Abnormal   Collection Time: 03/29/21  4:07 PM  Result Value Ref Range   Glucose-Capillary 141 (H) 70 - 99 mg/dL    Comment: Glucose reference range applies only to samples taken after fasting for at least 8 hours.  Glucose, capillary     Status: Abnormal   Collection Time: 03/29/21  7:10 PM  Result Value Ref Range   Glucose-Capillary 130 (H) 70 - 99 mg/dL    Comment: Glucose reference range applies only to samples taken after fasting for at least 8 hours.  Glucose, capillary     Status: None    Collection Time: 03/29/21 11:29 PM  Result Value Ref  Range   Glucose-Capillary 96 70 - 99 mg/dL    Comment: Glucose reference range applies only to samples taken after fasting for at least 8 hours.   Comment 1 Notify RN    Comment 2 Document in Chart   CBC     Status: Abnormal   Collection Time: 03/30/21  4:28 AM  Result Value Ref Range   WBC 13.4 (H) 4.0 - 10.5 K/uL   RBC 4.22 4.22 - 5.81 MIL/uL   Hemoglobin 12.7 (L) 13.0 - 17.0 g/dL   HCT 40.0 39.0 - 52.0 %   MCV 94.8 80.0 - 100.0 fL   MCH 30.1 26.0 - 34.0 pg   MCHC 31.8 30.0 - 36.0 g/dL   RDW 13.7 11.5 - 15.5 %   Platelets 277 150 - 400 K/uL   nRBC 0.0 0.0 - 0.2 %    Comment: Performed at [facility], [address] [place], [place] [address]  Basic metabolic panel     Status: Abnormal   Collection Time: 03/30/21  4:28 AM  Result Value Ref Range   Sodium 148 (H) 135 - 145 mmol/L   Potassium 3.7 3.5 - 5.1 mmol/L   Chloride 111 98 - 111 mmol/L   CO2 26 22 - 32 mmol/L   Glucose, Bld 142 (H) 70 - 99 mg/dL    Comment: Glucose reference range applies only to samples taken after fasting for at least 8 hours.   BUN 54 (H) 6 - 20 mg/dL   Creatinine, Ser 1.76 (H) 0.61 - 1.24 mg/dL   Calcium 9.1 8.9 - 10.3 mg/dL   GFR, Estimated 47 (L) >60 mL/min    Comment: (NOTE) Calculated using the CKD-EPI Creatinine Equation (2021)    Anion gap 11 5 - 15    Comment: Performed at [facility], [address], [place], [place] [address]  Magnesium     Status: None   Collection Time: 03/30/21  4:28 AM  Result Value Ref Range   Magnesium 2.4 1.7 - 2.4 mg/dL    Comment: Performed at [facility], [address], [place], [place] [address]  Phosphorus     Status: None   Collection Time: 03/30/21  4:28 AM  Result Value Ref Range   Phosphorus 4.2 2.5 - 4.6 mg/dL    Comment: Performed at [facility], [address] [place], [place] [address]  Glucose, capillary     Status: Abnormal   Collection Time:  03/30/21  4:59 AM  Result Value Ref Range   Glucose-Capillary 134 (H) 70 - 99 mg/dL    Comment: Glucose reference range applies only to samples taken after fasting for at least 8 hours.  Glucose, capillary     Status: Abnormal   Collection Time: 03/30/21  8:10 AM  Result Value Ref Range   Glucose-Capillary 122 (H) 70 - 99 mg/dL    Comment: Glucose reference range applies only to samples taken after fasting for at least 8 hours.   Comment 1 QC Due     Recent Results (from the past 240 hour(s))  Resp Panel by RT-PCR (Flu A&B, Covid) Nasopharyngeal Swab     Status: None   Collection Time: 03/23/21  2:54 AM   Specimen: Nasopharyngeal Swab; Nasopharyngeal(NP) swabs in vial transport medium  Result Value Ref Range Status   SARS Coronavirus 2 by RT PCR NEGATIVE NEGATIVE Final    Comment: (NOTE) SARS-CoV-2 target nucleic acids are NOT DETECTED.  The SARS-CoV-2 RNA is generally detectable in upper  respiratory specimens during the acute phase of infection. The lowest concentration of SARS-CoV-2 viral copies this assay can detect is 138 copies/mL. A negative result does not preclude SARS-Cov-2 infection and should not be used as the sole basis for treatment or other patient management decisions. A negative result may occur with  improper specimen collection/handling, submission of specimen other than nasopharyngeal swab, presence of viral mutation(s) within the areas targeted by this assay, and inadequate number of viral copies(<138 copies/mL). A negative result must be combined with clinical observations, patient history, and epidemiological information. The expected result is Negative.  Fact Sheet for Patients:  EntrepreneurPulse.com.au  Fact Sheet for Healthcare Providers:  IncredibleEmployment.be  This test is no t yet approved or cleared by the [place] FDA and  has been authorized for detection and/or diagnosis of SARS-CoV-2 by FDA under an  Emergency Use Authorization (EUA). This EUA will remain  in effect (meaning this test can be used) for the duration of the COVID-19 declaration under Section 564(b)(1) of the Act, 21 U.S.C.section 360bbb-3(b)(1), unless the authorization is terminated  or revoked sooner.       Influenza A by PCR NEGATIVE NEGATIVE Final   Influenza B by PCR NEGATIVE NEGATIVE Final    Comment: (NOTE) The Xpert Xpress SARS-CoV-2/FLU/RSV plus assay is intended as an aid in the diagnosis of influenza from Nasopharyngeal swab specimens and should not be used as a sole basis for treatment. Nasal washings and aspirates are unacceptable for Xpert Xpress SARS-CoV-2/FLU/RSV testing.  Fact Sheet for Patients: EntrepreneurPulse.com.au  Fact Sheet for Healthcare Providers: IncredibleEmployment.be  This test is not yet approved or cleared by the [place] FDA and has been authorized for detection and/or diagnosis of SARS-CoV-2 by FDA under an Emergency Use Authorization (EUA). This EUA will remain in effect (meaning this test can be used) for the duration of the COVID-19 declaration under Section 564(b)(1) of the Act, 21 U.S.C. section 360bbb-3(b)(1), unless the authorization is terminated or revoked.  Performed at [facility], [address] [place], [place] [address]   MRSA Next Gen by PCR, Nasal     Status: None   Collection Time: 03/23/21  4:20 AM   Specimen: Nasal Mucosa; Nasal Swab  Result Value Ref Range Status   MRSA by PCR Next Gen NOT DETECTED NOT DETECTED Final    Comment: (NOTE) The GeneXpert MRSA Assay (FDA approved for NASAL specimens only), is one component of a comprehensive MRSA colonization surveillance program. It is not intended to diagnose MRSA infection nor to guide or monitor treatment for MRSA infections. Test performance is not FDA approved in patients less than 72 years old. Performed at [facility], [address] [place], [place] [address]   Culture, Respiratory w Gram Stain     Status: None   Collection Time: 03/25/21  3:37 PM   Specimen: Tracheal Aspirate; Respiratory  Result Value Ref Range Status   Specimen Description   Final    TRACHEAL ASPIRATE Performed at [facility], [address] [place], [place] [address]    Special Requests   Final    NONE Performed at [facility], [address] [place], [place] [address]    Gram Stain   Final    MODERATE WBC PRESENT,BOTH PMN AND MONONUCLEAR MODERATE GRAM POSITIVE RODS FEW GRAM POSITIVE COCCI RARE GRAM NEGATIVE RODS    Culture   Final    Normal respiratory flora-no Staph aureus or Pseudomonas seen Performed at [facility],  [address], [place], [place] [address]    Report Status 03/28/2021 FINAL  Final  CULTURE, BLOOD (ROUTINE X 2) w Reflex to ID Panel     Status: None   Collection Time: 03/25/21  5:24 PM   Specimen: BLOOD  Result Value Ref Range Status   Specimen Description BLOOD BLOOD RIGHT HAND  Final   Special Requests   Final    BOTTLES DRAWN AEROBIC AND ANAEROBIC Blood Culture adequate volume   Culture   Final    NO GROWTH 5 DAYS Performed at [facility], [address] [place], [place] [address]    Report Status 03/30/2021 FINAL  Final  CULTURE, BLOOD (ROUTINE X 2) w Reflex to ID Panel     Status: None   Collection Time: 03/25/21  5:32 PM   Specimen: BLOOD  Result Value Ref Range Status   Specimen Description BLOOD BLOOD LEFT HAND  Final   Special Requests   Final    BOTTLES DRAWN AEROBIC AND ANAEROBIC Blood Culture adequate volume   Culture   Final    NO GROWTH 5 DAYS Performed at [facility], [address] [place], [place] [address]    Report Status 03/30/2021 FINAL  Final    Lipid Panel No results for input(s): CHOL, TRIG, HDL, CHOLHDL, VLDL, LDLCALC in the last 72 hours.  Studies/Results: CT HEAD WO CONTRAST (5MM)  Result Date: 03/29/2021 CLINICAL  DATA:  Seizure disorder, clinical change EXAM: CT HEAD WITHOUT CONTRAST TECHNIQUE: Contiguous axial images were obtained from the base of the skull through the vertex without intravenous contrast. RADIATION DOSE REDUCTION: This exam was performed according to the departmental dose-optimization program which includes automated exposure control, adjustment of the mA and/or kV according to patient size and/or use of iterative reconstruction technique. COMPARISON:  03/23/2021 CT head, correlation is also made with MRI 03/24/2021 FINDINGS: Brain: General preservation of gray white differentiation, with mild sulcal crowding, similar to 03/23/2021. No new hypodensity to suggest acute infarct. No acute hemorrhage, mass, mass effect, or midline shift. No hydrocephalus or extra-axial collection. Vascular: No hyperdense vessel Skull: Normal. Negative for fracture or focal lesion. Sinuses/Orbits: Mucosal thickening in the right-greater-than-left maxillary sinuses, right-greater-than-left ethmoid air cells, and bilateral sphenoid sinuses. The orbits are unremarkable. Other: Fluid throughout the right mastoid air cells. Partial opacification of the left mastoid air cells. The patient remains intubated. IMPRESSION: 1. Overall unchanged exam compared to 03/23/2021, with possible sulcal crowding but preservation of gray-white differentiation. If there is persistent concern for progression of anoxic injury, consider repeat MRI. 2. Mucosal thickening in the sinuses and fluid in the mastoid air cells, possibly related to intubation. Electronically Signed   By: Merilyn Baba M.D.   On: 03/29/2021 21:24   EEG adult  Result Date: 03/29/2021 Lora Havens, MD     03/29/2021  1:36 PM This patient Name: Billy Cherry MRN: [id number] Epilepsy Attending: Lora Havens Referring Physician/Provider: Bradly Bienenstock, NP Date: 03/29/2021 Duration: 31.59 mins Patient history: 51 year old male status post cardiac arrest now noted to have  eye twitching/remor like movements and rhythmic lip & tongue fasciculations.  EEG evaluate for seizure. Level of alertness: comatose AEDs during EEG study: LEV Technical aspects: This EEG study was done with scalp electrodes positioned according to the 10-20 International system of electrode placement. Electrical activity was acquired at a sampling rate of $Remov'500Hz'JvSMyH$  and reviewed with a high frequency filter of $RemoveB'70Hz'gwByMxEI$  and a low frequency filter of $RemoveB'1Hz'oORfVfqq$ . EEG data were recorded continuously  and digitally stored. Description: EEG showed continuous generalized 5 to 7 Hz theta slowing.  Generalized periodic discharges were also noted at 1.5 to 2 Hz. Patient was noted to have episodes of eye opening and lip twitching during the study, more prominent when stimulated. Concomitant EEG showed generalized spikes consistent with myoclonic seizure. Hyperventilation and photic stimulation were not performed.   ABNORMALITY -Myoclonic seizure, generalized -Continuous slow, generalized IMPRESSION: This study showed episodes of eye opening and lip twitching, more prominent when stimulated consistent with myoclonic seizures.  Additionally EEG was suggestive of severe diffuse encephalopathy.  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 Critical care team and Dr. Cheral Marker was notified. Priyanka Barbra Sarks    Medications: Scheduled:  carvedilol  25 mg Per Tube BID WC   chlorhexidine gluconate (MEDLINE KIT)  15 mL Mouth Rinse BID   Chlorhexidine Gluconate Cloth  6 each Topical Q0600   enoxaparin (LOVENOX) injection  0.5 mg/kg Subcutaneous Q24H   feeding supplement (PROSource TF)  45 mL Per Tube BID   free water  100 mL Per Tube Q4H   hydrALAZINE  25 mg Per Tube Q8H   insulin aspart  0-15 Units Subcutaneous Q4H   isosorbide dinitrate  20 mg Per Tube TID   mouth rinse  15 mL Mouth Rinse 10 times per day   pantoprazole sodium  40 mg Per Tube Daily   sodium chloride flush  3 mL Intravenous Q12H    valproic acid  500 mg Per Tube TID   Continuous:  sodium chloride     sodium chloride Stopped (03/23/21 0445)   clevidipine Stopped (03/29/21 1520)   dextrose 75 mL/hr at 03/30/21 0156   feeding supplement (VITAL HIGH PROTEIN) 65 mL/hr at 03/28/21 1803   levETIRAcetam 750 mg (03/30/21 0848)   Assessment: 51 year old male with history of hypertension who is admitted after cardiac arrest 03/22/21.  Etiology of arrest is unclear, given insignificant CAD on cath. Cardiology suspects this was 2/2 primary VF event vs possible angioedema 2/2 his lisinopril. He has been noted to have severely depressed EF (not present on left ventriculography during cath) and has completed heparin gtt for NSTEMI. He has intact brainstem reflexes on exam, but does not follow commands or have any volitional motor response in his extremities to pain. MRI brain wo contrast does show e/o anoxic brain injury. EEG 1/19 did not show any epileptiform abnl. Repeat EEG 1/23 showed evidence of epileptogenicity with generalized onset and moderate diffuse encephalopathy, without electrographic seizures.  - New bilateral eyelid tremor-like movements as well as lip and jaw tremor-like versus clonus movements were noted on neurological exam Wednesday (1/25).  - Keppra 2000 mg was bolused and scheduled dose increased to 750 mg BID.  - EEG was obtained, revealing abnormalities of generalized myoclonic seizure activity on a background of continuous generalized slowing. There were episodes of eye opening and lip twitching during the EEG, more prominent when stimulated, that were consistent with myoclonic seizures.  Additionally EEG was suggestive of severe diffuse encephalopathy.  In the setting of cardiac arrest, the EEG findings were most likely due to anoxic/hypoxic brain injury.  - After EEG results were obtained, VPA was added to his regimen on Wednesday with a 20 mg/kg load followed by 5 mg/kg per tube TID. - Exam today: Is unchanged  since yesterday.  - Repeat EEG for today is pending - Repeat EEG has also been ordered for Friday AM - Valproic acid level for  this AM is pending. Repeat level for Friday has been ordered - Ammonia level yesterday (1/25) mildly elevated at 38. Ordereing a repeat level for Friday.  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   Recommendations: - Continue scheduled Keppra and VPA - Monitoring VPA levels and ammonia as above - Frequent neuro checks - Plan for repeat neurologic exam for prognostication when myoclonic activity on EEG is stabilized. Cessation of the myoclonic activity without continuous IV sedation may not be possible, however, given the severe anoxic brain injury seen on MRI.    35 minutes spent in the neurological evaluation and management of this critically ill patient.   Addendum: - Today's EEG: Findings of electrographic myoclonic seizure, generalized; continuous slow, generalized. The myoclonic discharges on EEG are noted in the context of episodes of eye opening and lip twitching, more prominent when stimulated, consistent with myoclonic seizures.  Additionally EEG was suggestive of severe diffuse encephalopathy. 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 EEG appears similar to previous day. - Per family request, patient is being transferred for higher level of care to [facility] where LTM EEG can be obtained. Family wishes for maximal medical care at this time despite very poor neurological prognosis with very low likelihood of a meaningful neurological recovery.  - Discussed with ICU team possible option of adding sedative drip to fully suppress the electrographic myoclonus and lip twitching. Deferring to MICU for final decision given potential cardiovascular side effects. If propofol or Versed gtt are used to completely  suppress myoclonus on EEG, it is not felt likely that this will improve the likelihood of a meaningful neurological recovery given overall clinical picture without any neurological improvement on examination after being almost completely off sedation for 72 hours, except for intermittent doses of Dilaudid.      LOS: 7 days   '@Electronically'$  signed: Dr. Kerney Elbe 03/30/2021  8:48 AM

## 2021-03-30 NOTE — Discharge Summary (Signed)
Physician Discharge Summary  Patient ID: Billy Cherry MRN: [id number] DOB/AGE: Mar 30, 1970 51 y.o.  Admit date: 03/23/2021 Discharge date: 03/30/2021   Brief Pt Description / Synopsis:  51 year old male with out-of-hospital V. fib cardiac arrest in setting of respiratory arrest due to suspected angioedema from ACE inhibitor leading to asphyxiation. Cardiac catheterization without evidence of significant coronary artery disease. Now with concern for anoxic brain injury.  Requires transfer to [facility] for continuous EEG due to persistent myoclonic seizures.  Discharge Diagnoses:   V. Fib Cardiac Arrest Acute HFrEF (LVEF <32%) Acute Metabolic Encephalopathy Anoxic Brain Injury Myoclonic Seizures Acute Hypoxic Respiratory Failure Acute Kidney Injury Hypernatremia Leukocytosis, suspected Aspiration Pneumonia Hyperglycemia                                                      Discharge Summary:  51 y/o male was found down by his wife this evening with agonal respirations.  911 called, received out of hospital CPR, 1 shock with defibrillator, 2 doses of epinephrine.  Had return of spontaneous circulation.  Concern on his 12 lead EKG for anterolateral ST elevation so he was taken to the cath lab where his study showed no significant coronary artery disease and an LVEF 45-50%.   He remained encephalopathic post cardiac arrest so PCCM was consulted.     Family notes he rarely drinks alcohol, doesn't smoke, doesn't use illicit drugs.  Takes blood pressure medicine at home, may have run out recently.   Please see significant events section below for full detailed hospital course.   Discharge Plan by Diagnosis:   V. Fib arrest, left heart cath without significant coronary artery disease ~ suspect pt may have developed mild Angioedema due to home Lisinopril leading to airway obstruction with respiratory arrest Acute HFrEF (LVEF <20%, global hypokinesis) Hypertensive  -Continuous cardiac  monitoring -Continue  Coreg, hydralazine, and imdur  -NO ACEi or ARB due to suspected angioedema -Echocardiogram 03/23/21>> LVEF <20% with global hypokinesis, Grade II diastolic dysfunction, RV function normal -Cardiology following, appreciate input -Cardiac CATH without significant coronary artery disease -Urine drug screen negative -Serum Ethyl Alcohol negative   Acute Metabolic Encephalopathy post cardiac arrest Anoxic Brain Injury Myoclonic Seizures -Maintain a RASS goal of 0 to -1 -Neurology recommends starting Propofol to break Myoclonus -PRN versed -Avoid sedating medications as able -Daily wake up assessment -Completed Normothermia protocol -MRI Brain: consistent with hypoxic ischemic injury involving the bilateral ACA-MCA watershed cortex, bilateral occipital cortex and basal ganglia. -EEG with myoclonic seizures -Neurology following, appreciate input -Continue Keppra and Valproic acid -Will require transfer to [facility] for continuous EEG   Acute Hypoxic Respiratory Failure in the setting of cardiac arrest & Anoxic Brain Injury -Will need TRACHEOSTOMY for survival, ENT consulted ~ tentative plan for Trach next Tuesday 2/1 -Full vent support, implement lung protective strategies -Plateau pressures less than 30 cm H20 -Wean FiO2 & PEEP as tolerated to maintain O2 sats >92% -Follow intermittent Chest X-ray & ABG as needed -Spontaneous Breathing Trials when respiratory parameters met and mental status permits -Implement VAP Bundle -Prn Bronchodilators   Acute Kidney Injury Hypernatremia Creatinine 2.08 on admission -Monitor I&O's / urinary output -Follow BMP -Ensure adequate renal perfusion -Avoid nephrotoxic agents as able -Replace electrolytes as indicated -Continue D5W and free water flushes   Leukocytosis, suspect due to Aspiration Pneumonia -Monitor fever  curve -Trend WBC's  -Follow cultures as above -Completed 5 day course of Ceftriaxone    Hyperglycemia -CBG's q4h; Target range of 140 to 180 -SSI -Follow ICU Hypo/Hyperglycemia protocol    Significant Events:  1/19 admission, left heart cath without significant CAD.  Concern for anoxic brain injury 1/20: MRI pending, Neuro following 1/21: MRI shows areas of hypoxic-ischemic injury bilaterally. Fevered; cultures obtained. 1/23 severe brain damage 1/24 evidence of severe brain damage, remains critically ill 1/25: Plan for PEG today. Neuro exam unchanged.  Does exhibit new eyelid tremor like movements and lip/jaw tremor like vs clonus movement.  Bolused Keppra and increased maintenance dose.  Repeat EEG. 1/26: Repeat EEG unchanged and continues to show myoclonic seizures despite addition of Valproic acid yesterday. Will require transfer to [facility] for continuous EEG                 Significant Diagnostic Studies:  1/19: CT Head>>IMPRESSION: 1. Difficult without a prior study to exclude the possibility of anoxic injury; questionable loss of cerebral sulci throughout the brain, but the gray-white matter differentiation is preserved. If decreased mental status persists then repeat Head CT in 12-24 hours or noncontrast brain MRI would be valuable. 2. 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 No intracranial hemorrhage or mass effect. 3. Intubated, with mastoid air cell effusions. 1/19: Echocardiogram>>1. Left ventricular ejection fraction, by estimation, is <20%. The left  ventricle has severely decreased function. The left ventricle demonstrates  global hypokinesis. The left ventricular internal cavity size was mildly  dilated. There is mild left  ventricular hypertrophy. Left ventricular diastolic parameters are  consistent with Grade II diastolic dysfunction (pseudonormalization).   2. Right ventricular systolic function is normal. The right ventricular  size is normal.   3. The mitral valve is normal in structure. Trivial mitral valve  regurgitation.    4. The aortic valve is normal in structure. Aortic valve regurgitation is  not visualized.  1/20: MRI Brain>>IMPRESSION: Findings consistent with hypoxic ischemic injury involving the bilateral ACA-MCA watershed cortex, bilateral occipital cortex and basal ganglia. 1/25: CT Head>>IMPRESSION: 1. Overall unchanged exam compared to 03/23/2021, with possible sulcal crowding but preservation of gray-white differentiation. If there is persistent concern for progression of anoxic injury, consider repeat MRI. 2. Mucosal thickening in the sinuses and fluid in the mastoid air cells, possibly related to intubation.            Micro Data:  1/19 SARS CoV2/Flu > negative 1/19: MRSA PCR>> negative 1/21: Tracheal aspirate>> normal respiratory flora 1/21: Blood culture x2>> no growth    Antimicrobials:  Ceftriaxone 1/21>>1/25   Consults:  PCCM Cardiology Neurology  Discharge Exam:   General: Acutely ill-appearing male, laying in bed, intubated, no acute distress HENT: NCAT ETT in place PULM: Clear to auscultation bilaterally, no wheezing or rales noted, even, in PSV, no accessory muscle use CV: RRR, no mgr GI: BS+, soft, nontender MSK: normal bulk and tone, no deformities, no edema Neuro: Brainstem reflexes intact (positive cough/gag/corneal reflexes, overbreathing the ventilator), pupils PERRLA, however does not withdraw from painful stimulation, eyelid tremor like movements along with lip/jaw tremor vs. Clonus like movement  Vitals:   03/30/21 0752 03/30/21 0800 03/30/21 0900 03/30/21 1200  BP:  (!) 152/108 (!) 141/86   Pulse:  72 73   Resp:  20 20   Temp:  98.2 F (36.8 C) 99 F (37.2 C)   TempSrc:  Esophageal Esophageal   SpO2: 99% 97% 97% 96%  Weight:  Height:         Discharge Labs:   BMET Recent Labs  Lab 03/24/21 1700 03/25/21 0441 03/26/21 0534 03/27/21 0321 03/28/21 0538 03/29/21 0615 03/30/21 0428  NA  --    < > 142 148* 151* 150* 148*  K  --    <  > 3.9 4.0 4.2 3.8 3.7  CL  --    < > 110 115* 116* 115* 111  CO2  --    < > $R'24 25 26 26 26  'PZ$ GLUCOSE  --    < > 154* 129* 125* 144* 142*  BUN  --    < > 65* 74* 78* 60* 54*  CREATININE  --    < > 1.87* 1.74* 1.90* 1.68* 1.76*  CALCIUM  --    < > 8.7* 8.8* 9.0 9.2 9.1  MG 2.2  --   --  2.9* 2.8* 2.4 2.4  PHOS 5.0*  --   --  4.5 4.0 3.9 4.2   < > = values in this interval not displayed.    CBC Recent Labs  Lab 03/28/21 0538 03/29/21 0615 03/30/21 0428  HGB 12.3* 12.5* 12.7*  HCT 38.6* 39.9 40.0  WBC 13.3* 13.6* 13.4*  PLT 245 254 277    Anti-Coagulation No results for input(s): INR in the last 168 hours.        Allergies as of 03/30/2021       Reactions   Lisinopril Swelling   angioedema        Medication List     STOP taking these medications    lisinopril 10 MG tablet Commonly known as: ZESTRIL   sildenafil 25 MG tablet Commonly known as: VIAGRA       TAKE these medications    acetaminophen 325 MG tablet Commonly known as: TYLENOL Place 2 tablets (650 mg total) into feeding tube every 6 (six) hours as needed for mild pain or fever.   carvedilol 25 MG tablet Commonly known as: COREG Place 1 tablet (25 mg total) into feeding tube 2 (two) times daily with a meal.   chlorhexidine gluconate (MEDLINE KIT) 0.12 % solution Commonly known as: PERIDEX 15 mLs by Mouth Rinse route 2 (two) times daily.   Chlorhexidine Gluconate Cloth 2 % Pads Apply 6 each topically daily at 6 (six) AM. Start taking on: March 31, 2021   clevidipine 0.5 MG/ML Emul Commonly known as: CLEVIPREX Inject 0-21 mg/hr into the vein continuous.   dextrose 5 % solution Inject 75 mL/hr into the vein continuous.   docusate 50 MG/5ML liquid Commonly known as: COLACE Place 10 mLs (100 mg total) into feeding tube 2 (two) times daily as needed for mild constipation.   enoxaparin 60 MG/0.6ML injection Commonly known as: LOVENOX Inject 0.55 mLs (55 mg total) into the skin  daily.   feeding supplement (VITAL HIGH PROTEIN) Liqd liquid Place 1,000 mLs into feeding tube continuous.   feeding supplement (PROSource TF) liquid Place 45 mLs into feeding tube 2 (two) times daily.   free water Soln Place 100 mLs into feeding tube every 4 (four) hours.   hydrALAZINE 25 MG tablet Commonly known as: APRESOLINE Place 1 tablet (25 mg total) into feeding tube every 8 (eight) hours.   hydrALAZINE 20 MG/ML injection Commonly known as: APRESOLINE Inject 0.5-1 mLs (10-20 mg total) into the vein every 4 (four) hours as needed (SBP>180 DBP>105).   HYDROmorphone 1 MG/ML injection Commonly known as: DILAUDID Inject 1 mL (1 mg total) into the  vein every 30 (thirty) minutes as needed (to achieve RASS & CPOT goal.).   HYDROmorphone 1 MG/ML injection Commonly known as: DILAUDID Inject 1-3 mLs (1-3 mg total) into the vein every hour as needed (to achieve RASS & CPOT goal.).   insulin aspart 100 UNIT/ML injection Commonly known as: novoLOG Inject 0-15 Units into the skin every 4 (four) hours.   isosorbide dinitrate 20 MG tablet Commonly known as: ISORDIL Place 1 tablet (20 mg total) into feeding tube 3 (three) times daily.   labetalol 5 MG/ML injection Commonly known as: NORMODYNE Inject 2 mLs (10 mg total) into the vein every 2 (two) hours as needed (Give second line if persistant hypertesion >160 (hold for HR <60)).   levETIRAcetam 750 mg in sodium chloride 0.9 % 100 mL Inject 750 mg into the vein every 12 (twelve) hours.   mouth rinse Liqd solution 15 mLs by Mouth Rinse route every 4 (four) hours.   ondansetron 4 MG/2ML Soln injection Commonly known as: ZOFRAN Inject 2 mLs (4 mg total) into the vein every 6 (six) hours as needed for nausea.   pantoprazole sodium 40 mg Commonly known as: PROTONIX Place 40 mg into feeding tube daily.   polyethylene glycol 17 g packet Commonly known as: MIRALAX / GLYCOLAX Place 17 g into feeding tube daily as needed for mild  constipation or moderate constipation.   propofol 1000 MG/100ML Emul injection Commonly known as: DIPRIVAN Inject 509-8,144 mcg/min into the vein continuous.   sodium chloride 0.9 % infusion Inject 250 mLs into the vein as needed (for IV line care  (Saline / Heparin Lock)).   sodium chloride 0.9 % infusion Inject 250 mLs into the vein continuous.   sodium chloride flush 0.9 % Soln Commonly known as: NS Inject 3 mLs into the vein every 12 (twelve) hours.   sodium chloride flush 0.9 % Soln Commonly known as: NS Inject 3 mLs into the vein as needed.   valproic acid 250 MG/5ML solution Commonly known as: DEPAKENE Place 10 mLs (500 mg total) into feeding tube 3 (three) times daily.            Disposition: ICU  Discharged Condition: Granville Whitefield has met maximum benefit of inpatient care at [facility] and requires transfer to [facility] for continuous EEG and higher level of care   Time spent on disposition:  50 Minutes.     Signed: Darel Hong, AGACNP-BC [facility] Prefer epic messenger for cross cover needs If after hours, please call E-link

## 2021-03-30 NOTE — Progress Notes (Signed)
Eeg done 

## 2021-03-30 NOTE — Progress Notes (Signed)
°  Transition of Care [facility]) Screening Note   Patient Details  Name: Billy Cherry Date of Birth: 10/06/1970   Transition of Care [facility]) CM/SW Contact:    Glennon Mac, RN Phone Number: 03/30/2021, 5:33 PM    Transition of Care Department [facility]) has reviewed patient and no TOC needs have been identified at this time. We will continue to monitor patient advancement through interdisciplinary progression rounds. If new patient transition needs arise, please place a TOC consult.   Quintella Baton, RN, BSN  Trauma/Neuro ICU Case Manager [phone]

## 2021-03-30 NOTE — Progress Notes (Signed)
LTM EEG hooked up and running - no initial skin breakdown - push button tested - neuro notified. [facility] monitoring.  

## 2021-03-31 ENCOUNTER — Inpatient Hospital Stay (HOSPITAL_COMMUNITY): Payer: BC Managed Care – PPO

## 2021-03-31 DIAGNOSIS — I469 Cardiac arrest, cause unspecified: Secondary | ICD-10-CM | POA: Diagnosis not present

## 2021-03-31 LAB — GLUCOSE, CAPILLARY
Glucose-Capillary: 112 mg/dL — ABNORMAL HIGH (ref 70–99)
Glucose-Capillary: 117 mg/dL — ABNORMAL HIGH (ref 70–99)
Glucose-Capillary: 136 mg/dL — ABNORMAL HIGH (ref 70–99)
Glucose-Capillary: 143 mg/dL — ABNORMAL HIGH (ref 70–99)
Glucose-Capillary: 145 mg/dL — ABNORMAL HIGH (ref 70–99)
Glucose-Capillary: 155 mg/dL — ABNORMAL HIGH (ref 70–99)

## 2021-03-31 LAB — CBC
HCT: 39.8 % (ref 39.0–52.0)
Hemoglobin: 12.8 g/dL — ABNORMAL LOW (ref 13.0–17.0)
MCH: 30.5 pg (ref 26.0–34.0)
MCHC: 32.2 g/dL (ref 30.0–36.0)
MCV: 94.8 fL (ref 80.0–100.0)
Platelets: 303 10*3/uL (ref 150–400)
RBC: 4.2 MIL/uL — ABNORMAL LOW (ref 4.22–5.81)
RDW: 13.5 % (ref 11.5–15.5)
WBC: 15.2 10*3/uL — ABNORMAL HIGH (ref 4.0–10.5)
nRBC: 0 % (ref 0.0–0.2)

## 2021-03-31 LAB — BASIC METABOLIC PANEL
Anion gap: 10 (ref 5–15)
BUN: 55 mg/dL — ABNORMAL HIGH (ref 6–20)
CO2: 27 mmol/L (ref 22–32)
Calcium: 9.1 mg/dL (ref 8.9–10.3)
Chloride: 111 mmol/L (ref 98–111)
Creatinine, Ser: 1.71 mg/dL — ABNORMAL HIGH (ref 0.61–1.24)
GFR, Estimated: 48 mL/min — ABNORMAL LOW (ref >60)
Glucose, Bld: 129 mg/dL — ABNORMAL HIGH (ref 70–99)
Potassium: 4.4 mmol/L (ref 3.5–5.1)
Sodium: 148 mmol/L — ABNORMAL HIGH (ref 135–145)

## 2021-03-31 LAB — MAGNESIUM: Magnesium: 2.3 mg/dL (ref 1.7–2.4)

## 2021-03-31 LAB — PHOSPHORUS: Phosphorus: 5 mg/dL — ABNORMAL HIGH (ref 2.5–4.6)

## 2021-03-31 LAB — VALPROIC ACID LEVEL: Valproic Acid Lvl: 20 ug/mL — ABNORMAL LOW (ref 50.0–100.0)

## 2021-03-31 MED ORDER — VALPROIC ACID 250 MG/5ML PO SOLN
750.0000 mg | Freq: Three times a day (TID) | ORAL | Status: DC
  Administered 2021-03-31 – 2021-04-06 (×20): 750 mg
  Filled 2021-03-31 (×21): qty 15

## 2021-03-31 MED ORDER — PROSOURCE TF PO LIQD
90.0000 mL | Freq: Three times a day (TID) | ORAL | Status: DC
  Administered 2021-03-31 – 2021-04-07 (×21): 90 mL
  Filled 2021-03-31 (×21): qty 90

## 2021-03-31 MED ORDER — LABETALOL HCL 100 MG PO TABS: 50.0000 mg | ORAL_TABLET | Freq: Two times a day (BID) | ORAL | Status: DC

## 2021-03-31 MED ORDER — PANTOPRAZOLE 2 MG/ML SUSPENSION
40.0000 mg | Freq: Every day | ORAL | Status: DC
  Administered 2021-03-31 – 2021-10-03 (×187): 40 mg
  Filled 2021-03-31 (×191): qty 20

## 2021-03-31 MED ORDER — VITAL 1.5 CAL PO LIQD
1000.0000 mL | ORAL | Status: DC
  Administered 2021-03-31 – 2021-04-25 (×22): 1000 mL
  Filled 2021-03-31 (×11): qty 1000

## 2021-03-31 MED ORDER — CHLORHEXIDINE GLUCONATE CLOTH 2 % EX PADS
6.0000 | MEDICATED_PAD | Freq: Every day | CUTANEOUS | Status: DC
  Administered 2021-03-31 – 2021-04-17 (×18): 6 via TOPICAL

## 2021-03-31 MED ORDER — HYDRALAZINE HCL 50 MG PO TABS
50.0000 mg | ORAL_TABLET | Freq: Three times a day (TID) | ORAL | Status: DC
  Administered 2021-03-31 – 2021-05-03 (×97): 50 mg
  Filled 2021-03-31 (×98): qty 1

## 2021-03-31 NOTE — Progress Notes (Signed)
EEG maintenance performed.  No skin breakdown observed at electrode sites Fp1, Fp2. 

## 2021-03-31 NOTE — Procedures (Addendum)
Patient Name: Billy Cherry  MRN: [id number]  Epilepsy Attending: Charlsie Quest  Referring Physician/Provider: Dr Milon Dikes Duration: 03/30/2021 1719 to 03/31/2021 1719   Patient history: 51 year old male status post cardiac arrest now noted to have eye twitching/remor like movements and rhythmic lip & tongue fasciculations.  EEG evaluate for seizure.   Level of alertness: comatose   AEDs during EEG study: LEV, VPA   Technical aspects: This EEG study was done with scalp electrodes positioned according to the 10-20 International system of electrode placement. Electrical activity was acquired at a sampling rate of 500Hz  and reviewed with a high frequency filter of 70Hz  and a low frequency filter of 1Hz . EEG data were recorded continuously and digitally stored.    Description: EEG showed continuous generalized 5 to 7 Hz theta slowing.  Generalized periodic discharges were also noted at 1.5 to 2 Hz predominantly when patient was awake/stimulated.  This EEG is consistent with stimulation induced periodic discharges.   Patient was noted to have episodes of eye opening during the study, more prominent when stimulated. Concomitant EEG showed generalized spikes consistent with myoclonic seizure.    Hyperventilation and photic stimulation were not performed.      ABNORMALITY -Myoclonic seizure, generalized -Stimulation induced periodic discharges, generalized -Continuous slow, generalized   IMPRESSION: This study showed episodes of eye opening consistent with myoclonic seizures.  Additionally EEG suggestive of epileptogenicity with generalized onset and high potential for seizure recurrence.  Lastly, EEG was suggestive of severe diffuse encephalopathy.  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  Wandalene Abrams 

## 2021-03-31 NOTE — Progress Notes (Signed)
° °  Palliative Medicine Inpatient Follow Up Note   I have acknowledged the consult for Palliative Medicine Team on [name].  I have called patients spouse, Retillas to see if she may be able to come in to meet. Retillas shares with me that from her understanding the medical team plans to pursue a [facility] and PEG which is aligned with her goals for the patient. She shares that she is taking his care one day at a time with the hope(s) of improvement.   Reviewed that either way it would be nive to meet to offer additional support while Retillas and her family are going through this difficult time. Also, to review the "what if's" of his clinical situation.  Retillas is amenable to meeting though cannot commit presently to a time. She is open to use continuing to check in throughout the weekend.   No Charge ______________________________________________________________________________________ [place] Team Team Cell Phone: [phone] Please utilize secure chat with additional questions, if there is no response within 30 minutes please call the above phone number  Palliative Medicine Team providers are available by phone from 7am to 7pm daily and can be reached through the team cell phone.  Should this patient require assistance outside of these hours, please call the patient's attending physician.

## 2021-03-31 NOTE — Progress Notes (Signed)
Subjective: Patient continues to have episodes of eye twitching predominantly when stimulated.  ROS: Unable to obtain due to poor mental status  Examination  Vital signs in last 24 hours: Temp:  [97.9 F (36.6 C)-99.2 F (37.3 C)] 98.7 F (37.1 C) (01/27 1200) Pulse Rate:  [69-91] 91 (01/27 1200) Resp:  [15-21] 18 (01/27 1200) BP: (117-166)/(76-114) 136/99 (01/27 1200) SpO2:  [91 %-99 %] 96 % (01/27 1200) FiO2 (%):  [30 %] 30 % (01/27 1133)  General: lying in bed, NAD CVS: pulse-normal rate and rhythm RS: Intubated, symmetric air entry bilaterally Extremities: Warm, no edema Neuro: Opens eyes to noxious stimulation but not tracking examiner, PERRLA, corneal reflex intact, gag reflex intact, does not withdraw to noxious stimuli in all 4 extremities   Basic Metabolic Panel: Recent Labs  Lab 03/27/21 0321 03/28/21 0538 03/29/21 0615 03/30/21 0428 03/31/21 0334  NA 148* 151* 150* 148* 148*  K 4.0 4.2 3.8 3.7 4.4  CL 115* 116* 115* 111 111  CO2 25 26 26 26 27   GLUCOSE 129* 125* 144* 142* 129*  BUN 74* 78* 60* 54* 55*  CREATININE 1.74* 1.90* 1.68* 1.76* 1.71*  CALCIUM 8.8* 9.0 9.2 9.1 9.1  MG 2.9* 2.8* 2.4 2.4 2.3  PHOS 4.5 4.0 3.9 4.2 5.0*    CBC: Recent Labs  Lab 03/27/21 0321 03/28/21 0538 03/29/21 0615 03/30/21 0428 03/31/21 0334  WBC 15.9* 13.3* 13.6* 13.4* 15.2*  HGB 12.6* 12.3* 12.5* 12.7* 12.8*  HCT 39.7 38.6* 39.9 40.0 39.8  MCV 93.2 93.7 95.5 94.8 94.8  PLT 214 245 254 277 303     Coagulation Studies: No results for input(s): LABPROT, INR in the last 72 hours.  Imaging CT head without contrast 03/29/2021: Overall unchanged exam compared to 03/23/2021, with possible sulcal crowding but preservation of gray-white differentiation.  MRI brain without contrast 03/24/2021: Findings consistent with hypoxic ischemic injury involving the bilateral ACA-MCA watershed cortex, bilateral occipital cortex and basal ganglia.    ASSESSMENT AND PLAN: 51 year old  lady status post cardiac arrest on 03/22/2021.  Cardiac arrest Anoxic/hypoxic brain injury Acute anoxic encephalopathy Myoclonic seizures Hyper natremia AKI Leukocytosis Anemia Hyperammonemia -LTM EEG continues to show myoclonic seizures and stimulus induced.  He discharges as well as abilities encephalopathy.  This EEG pattern is consistent with anoxic/hypoxic brain injury  Recommendations -Depakote level was subtherapeutic, increase Depakote to 750 mg 3 times daily -Continue Keppra 750 mg twice daily -Continue LTM EEG overnight to monitor for intermittent seizures -I called and discussed with patient's wife patient's current neurological exam, EEG and MRI findings. I also discussed that the medications we are currently giving cannot reverse the anoxic injury.  Antiseizure medications are to minimize further brain injury.  However, myoclonic seizures are usually suggestive of significant brain injury. - I tried to ask patient's wife what Mr. Tuberville would have wanted/thought of as acceptable quality of life in such situation.  Wife still thinks patient will improve but is unable to tell me what improvement means to her.  I also tried to discuss what long-term care might look like for Mr. Vick after trach and PEG.  Patient's wife mentioned some supplement she looked up online to help with memory. She wants to continue to aggressively treat Mr. Mincey and does not wish to speak about goals of care at this point.  I encouraged patient's wife to talk to family members about goals of care. -I updated critical care team and palliative care team about my discussion with patient's wife.  I  think at this point we we will continue to treat Mr. Markgraf and readdress goals of care next week -Continue seizure precautions -Minimize sedative medications -Management of rest of comorbidities per primary team  I have spent a total of  50 minutes with the patient reviewing hospital notes,  test results,  labs and examining the patient as well as establishing an assessment and plan that was discussed personally with the patient's wife, ICU and palliative team.  > 50% of time was spent in direct patient care.     Lindie Spruce Epilepsy [facility] For questions after 5pm please refer to AMION to reach the Neurologist on call

## 2021-03-31 NOTE — Progress Notes (Signed)
Initial Nutrition Assessment  DOCUMENTATION CODES:   Not applicable  INTERVENTION:   - Discussed with CCM, plan to exchange OG tube for Cortrak tube today  Tube feeding: - Change to Vital 1.5 @ 60 ml/hr (1440 ml/day) via Cortrak once placed - ProSource TF 90 ml TID - Free water flushes per CCM, currently 200 ml q 4 hours  Tube feeding regimen provides 2400 kcal, 163 grams of protein, and 1100 ml of H2O.   Total free water with flushes: 2300 ml  NUTRITION DIAGNOSIS:   Inadequate oral intake related to inability to eat as evidenced by NPO status.  GOAL:   Patient will meet greater than or equal to 90% of their needs  MONITOR:   Vent status, Labs, Weight trends, TF tolerance, I & O's  REASON FOR ASSESSMENT:   Ventilator, Consult Enteral/tube feeding initiation and management  ASSESSMENT:   51 year old male who originally presented to [facility] on 1/19 after out-of-hospital cardiac arrest due to suspected angioedema from ACE inhibitor leading to asphyxiation. PMH of HTN, HLD.  01/26 -  transferred from [facility] to [facility]  Per notes, MRI is consistent with anoxic brain injury. Noted Palliative Care has been consulted for goals of care. Per notes, pt will likely need trach, PEG, and LTACH.  Consult received for tube feeding initiation and management. Pt with OG tube in stomach. Currently receiving tube feeds per protocol. Discussed pt with RN and CCM. CCM to place order to exchange OG tube for Cortrak today.  Current tube feeds: Vital High Protein @ 40 ml/hr, ProSource TF 45 ml BID, free water flushes 200 ml q 4 hours  Patient is currently intubated on ventilator support MV: 9.9 L/min Temp (24hrs), Avg:98.5 F (36.9 C), Min:97.9 F (36.6 C), Max:99.2 F (37.3 C)  Medications reviewed and include: colace, SSI q 4 hours, IV protonix, miralax, IV keppra  Labs reviewed: sodium 148, BUN 55, creatinine 1.71, phosphorus 5.0, WBC 15.2 CBG's: 108-155 x 24 hours  UOP: 1100 ml x 12  hours  NUTRITION - FOCUSED PHYSICAL EXAM:  Flowsheet Row Most Recent Value  Orbital Region No depletion  Upper Arm Region No depletion  Thoracic and Lumbar Region No depletion  Buccal Region Unable to assess  Temple Region No depletion  Clavicle Bone Region Mild depletion  Clavicle and Acromion Bone Region Mild depletion  Scapular Bone Region No depletion  Dorsal Hand No depletion  Patellar Region No depletion  Anterior Thigh Region No depletion  Posterior Calf Region No depletion  Edema (RD Assessment) Mild  [generalized]  Hair Reviewed  Eyes Reviewed  Mouth Unable to assess  Skin Reviewed  Nails Reviewed       Diet Order:   Diet Order             Diet NPO time specified  Diet effective now                   EDUCATION NEEDS:   No education needs have been identified at this time  Skin:  Skin Assessment: Reviewed RN Assessment  Last BM:  03/31/21 rectal tube  Height:   Ht Readings from Last 1 Encounters:  03/28/21 5' 10.98" (1.803 m)    Weight:   Wt Readings from Last 1 Encounters:  03/30/21 101.8 kg    Ideal Body Weight:  78.2 kg  BMI:  31.32 kg/m^2 (calculated using height from 1/24 and weight from 1/26)  Estimated Nutritional Needs:   Kcal:  2200-2400  Protein:  155-170 grams  Fluid:  >/= 2.2 L    Gustavus Bryant, MS, RD, LDN Inpatient Clinical Dietitian Please see AMiON for contact information.

## 2021-03-31 NOTE — Progress Notes (Addendum)
NAME:  Billy Cherry, MRN:  [id number], DOB:  11-08-1970, LOS: 1 ADMISSION DATE:  03/30/2021, CONSULTATION DATE:  1/19 REFERRING MD:  Lorinda Creed, CHIEF COMPLAINT:  Found down, cardiac arrest   Brief Pt Description / Synopsis:  51 year old male with out-of-hospital V. fib cardiac arrest in setting of respiratory arrest due to suspected angioedema from ACE inhibitor leading to asphyxiation. Cardiac catheterization without evidence of significant coronary artery disease. Now with concern for anoxic brain injury.  He remains critically ill. Hospital day 8. Day 1 at [facility].  History of Present Illness:  51 y/o male was found down by his wife this evening with agonal respirations.  911 called, received out of hospital CPR, 1 shock with defibrillator, 2 doses of epinephrine.  Had return of spontaneous circulation.  Concern on his 12 lead EKG for anterolateral ST elevation so he was taken to the cath lab where his study showed no significant coronary artery disease and an LVEF 45-50%.   He remained encephalopathic post cardiac arrest so PCCM was consulted.    Family notes he rarely drinks alcohol, doesn't smoke, doesn't use illicit drugs.  Takes blood pressure medicine at home, may have run out recently.    Pertinent  Medical History  Hypertension  Micro Data:  1/19 SARS CoV2/Flu > negative 1/19: MRSA PCR>> negative 1/21: Tracheal aspirate>> normal respiratory flora 1/21: Blood culture x2>> no growth   Antimicrobials:  Ceftriaxone 1/21>>1/25  Significant Hospital Events: Including procedures, antibiotic start and stop dates in addition to other pertinent events   1/19 admission, left heart cath without significant CAD.  Concern for anoxic brain injury 1/20: MRI pending, Neuro following 1/21: MRI shows areas of hypoxic-ischemic injury bilaterally. Fevered; cultures obtained. 1/23 severe brain damage 1/24 evidence of severe brain damage, remains critically ill 1/25: Plan for PEG today. Neuro  exam unchanged.  Does exhibit new eyelid tremor like movements and lip/jaw tremor like vs clonus movement.  Bolused Keppra and increased maintenance dose.  Repeat EEG. CTH: concerning for progression of anoxic injury.  1/26: Repeat EEG unchanged and continues to show myoclonic seizures despite addition of Valproic acid yesterday. transfer to [facility] for continuous EEG  Interim History / Subjective:  Remains intubated , assisting the vent ( 30%, Rate 20, PEEP 5, TV 500) Continued myoclonic eye twitching, eyes are open , he does not blink to threat, + Corneal reflex, + Gag, + cough. Overbreathing the vent. No purposeful movement noted.  Neuro plan to continue EEG overnight .   + 316 cc's  T max 99.2  Unable to obtain subjective evaluation due to patient status Objective   Blood pressure (!) 148/99, pulse 69, temperature 99.2 F (37.3 C), temperature source Oral, resp. rate 20, SpO2 98 %.    Vent Mode: PRVC FiO2 (%):  [30 %] 30 % Set Rate:  [20 bmp] 20 bmp Vt Set:  [500 mL] 500 mL PEEP:  [5 cmH20] 5 cmH20 Plateau Pressure:  [12 cmH20-22 cmH20] 14 cmH20   Intake/Output Summary (Last 24 hours) at 03/31/2021 0820 Last data filed at 03/31/2021 0730 Gross per 24 hour  Intake 1416 ml  Output 1100 ml  Net 316 ml    There were no vitals filed for this visit.   Examination: General: In bed, NAD, appears comfortable, minimal vent settings HEENT: MM pink/moist, anicteric, atraumatic, No LAD, No oral edema Neuro: RASS -5, PERRL 27mm, + myoclonus with rhythmic eye blinking, eyes open spontaneously, no movement to stimulation, Eyes slightly dysconjugate CV: S1S2, NSR, no m/r/g  appreciated PULM:  Bilateral chest excursion, some coarse rhonchi, over breathing the vent, regular and unlabored respirations  GI: soft, bsx4 active, non-tender , flexi seal in place with brown stool, Tube feeds per OG Extremities: warm/dry, no pretibial edema, capillary refill less than 3 seconds  Skin:  no rashes or  lesions noted  Labs: WBC  13.4>> 15.2 HGB 12.7>> 12.8 Platelets 303 NA 148>> 148 BUN 54>> 55, Creat  1.76 >>1.71 1/25 CTH: concerning for progression of anoxic injury.    Resolved Hospital Problem list     Assessment & Plan:  V. Fib arrest, left heart cath without significant coronary artery disease ~ suspect pt may have developed mild Angioedema due to home Lisinopril leading to airway obstruction with respiratory arrest Acute HFrEF (LVEF <20%, LV with severely decreased function, Grade II diastolic dysfunction, global hypokinesis, trivial mitral regurgitation, ) Hypertension>> BP Goal is < 123XX123 systolic Echocardiogram 0000000 LVEF <20% with global hypokinesis, Grade II diastolic dysfunction, RV function normal. UDS negative. ETOH negative. Cardiac cath without significant CAD. -Cardiology consulted and following at [facility]. Will re-engage if needed -Continue coreg, hydralazine, and imdur -Continue continuous tele and spo2 monitoring -Cleviprex off ( Since 1/25 at 1500)  -Do not restart ACEi or ARB.    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 MRI brain consistent with hypoxic ischemic injury involving bilateral ACA-MCA watershed cortex, bilateral occipital cortex and basal ganglia. 1/25 CTH: concerning for progression of anoxic injury.  -Neuro consulted and following. Transferred to [facility] for EEGc>> will continue overnight 1/27 -Continue Keppra and valproic acid>> Will check Valproic acid level and adjust dosing as needed as continued Myoclonic eye movement  -Continue neuroprotective measures- normothermia, euglycemia, HOB greater than 30, head in neutral alignment, normocapnia, normoxia.  - Check Mag and replete as needed for goal of > 2 -Goal RASS as below. Continue neuro checks.  - Will most likely need trach and PEG, LTAC. Will consult Palliative care for GOC    Acute Hypoxic Respiratory Failure in the  setting of cardiac arrest & Anoxic Brain Injury -LTVV strategy with tidal volumes of 4-8 cc/kg ideal body weight -Goal plateau pressures less than 30 and driving pressures less than 15 -Wean PEEP/FiO2 for SpO2 92-98% -VAP bundle -Daily SAT and SBT -PAD bundle with fentanyl PRN. -RASS goal 0 to -1 -Follow intermittent CXR and ABG PRN -Continue PRN BDs   HTN Goal < 123XX123 systolic Plan Will increase apresoline to 50 mg Q 8 Continue Coreg 25 mg BID Continue prn apresoline and labetalol for breakthrough SBP > 170   Acute Kidney Injury Hypernatremia Creatinine 2.08 on admission. Creat 1.68>1.76. , now 1.71 NA 150>148. -Ensure renal perfusion. Goal MAP 65 or greater. -Avoid neprotoxic drugs as possible. -Strict I&O's - Trend BMET - Continue free water flushes. 150ml q4h.   Leukocytosis, suspect due to Aspiration Pneumonia CXR 1/26>> Improving perihilar atelectasis. S/P 5 day course of ceftriaxone -Follow cultures -Trend WBC/Fever curve - CXR prn  Nutrition Plan TF started, at goal. Pt. tolerating   Hyperglycemia -Blood Glucose goal 140-180. -SSI  Continued Myoclonus and no improvement in purposeful neurologic function. Will check valproic acid level and adjust dosage as needed , continue Keppra. Appreciate Neuro assistance. Will consult Palliative care for goals of care, at present family want aggressive care. Will most likely need trach and PEG and LTAC. Will re-evaluate EEG after additional continuous overnight study.    Best Practice (right click and "Reselect all SmartList Selections" daily)  Diet/type: tubefeeds DVT prophylaxis: Lovenox GI prophylaxis: PPI Lines: N/A Foley:  Yes, and it is still needed Code Status:  full code Last date of multidisciplinary goals of care discussion [1/26]  Labs   CBC: Recent Labs  Lab 03/27/21 0321 03/28/21 0538 03/29/21 0615 03/30/21 0428 03/31/21 0334  WBC 15.9* 13.3* 13.6* 13.4* 15.2*  HGB 12.6* 12.3* 12.5* 12.7* 12.8*   HCT 39.7 38.6* 39.9 40.0 39.8  MCV 93.2 93.7 95.5 94.8 94.8  PLT 214 245 254 277 [id number]    Basic Metabolic Panel: Recent Labs  Lab 03/27/21 0321 03/28/21 0538 03/29/21 0615 03/30/21 0428 03/31/21 0334  NA 148* 151* 150* 148* 148*  K 4.0 4.2 3.8 3.7 4.4  CL 115* 116* 115* 111 111  CO2 25 26 26 26 27   GLUCOSE 129* 125* 144* 142* 129*  BUN 74* 78* 60* 54* 55*  CREATININE 1.74* 1.90* 1.68* 1.76* 1.71*  CALCIUM 8.8* 9.0 9.2 9.1 9.1  MG 2.9* 2.8* 2.4 2.4 2.3  PHOS 4.5 4.0 3.9 4.2 5.0*   GFR: Estimated Creatinine Clearance: 62.8 mL/min (A) (by C-G formula based on SCr of 1.71 mg/dL (H)). Recent Labs  Lab 03/28/21 0538 03/29/21 0615 03/30/21 0428 03/31/21 0334  WBC 13.3* 13.6* 13.4* 15.2*    Liver Function Tests: Recent Labs  Lab 03/25/21 0441 03/26/21 0534  AST 56* 31  ALT 63* 44  ALKPHOS 43 40  BILITOT 1.0 0.8  PROT 6.8 6.8  ALBUMIN 3.4* 3.0*   No results for input(s): LIPASE, AMYLASE in the last 168 hours. Recent Labs  Lab 03/29/21 1413  AMMONIA 38*    ABG    Component Value Date/Time   PHART 7.43 03/24/2021 0500   PCO2ART 36 03/24/2021 0500   PO2ART 111 (H) 03/24/2021 0500   HCO3 23.9 03/24/2021 0500   ACIDBASEDEF 0.3 03/23/2021 0556   O2SAT 98.5 03/24/2021 0500     Coagulation Profile: No results for input(s): INR, PROTIME in the last 168 hours.   Cardiac Enzymes: No results for input(s): CKTOTAL, CKMB, CKMBINDEX, TROPONINI in the last 168 hours.  HbA1C: Hgb A1c MFr Bld  Date/Time Value Ref Range Status  03/23/2021 06:23 AM 5.4 4.8 - 5.6 % Final    Comment:    (NOTE) Pre diabetes:          5.7%-6.4%  Diabetes:              >6.4%  Glycemic control for   <7.0% adults with diabetes   03/23/2021 02:54 AM 5.5 4.8 - 5.6 % Final    Comment:    DUPLICATE AT [id number]. THE NEXT SAMPLE WAS CREDITED. (NOTE) Pre diabetes:          5.7%-6.4%  Diabetes:              >6.4%  Glycemic control for   <7.0% adults with diabetes      CBG: Recent Labs  Lab 03/30/21 1143 03/30/21 1710 03/30/21 1939 03/30/21 2328 03/31/21 0328  GLUCAP 155* 108* 117* 121* 117*   Allergies Allergies  Allergen Reactions   Lisinopril Swelling    angioedema     Home Medications  Prior to Admission medications   Medication Sig Start Date End Date Taking? Authorizing Provider  lisinopril (PRINIVIL,ZESTRIL) 10 MG tablet Take 10 mg by mouth daily.  11/12/16   [provider]  lisinopril (ZESTRIL) 10 MG tablet Take 1 tablet (10 mg total) by mouth daily. 12/06/19 12/05/20  Johnn Hai, PA-C     Critical care  time: 45 minutes     Magdalen Spatz, MSN, AGACNP-BC [place] [facility] [place] See Amion for personal pager PCCM on call pager [phone]  [facility]  03/31/2021 , 8:20 AM  Please see [email or web address] for pager details  If no response, please call [phone] After hours, please call Elink at [phone]

## 2021-03-31 NOTE — Procedures (Signed)
Cortrak  Person Inserting Tube:  Kendell Bane C, RD Tube Type:  Cortrak - 43 inches Tube Size:  10 Tube Location:  Left nare Initial Placement:  Stomach Secured by: Bridle Technique Used to Measure Tube Placement:  Marking at nare/corner of mouth Cortrak Secured At:  75 cm  Cortrak Tube Team Note:  Consult received to place a Cortrak feeding tube.   X-ray is required, abdominal x-ray has been ordered by the Cortrak team. Please confirm tube placement before using the Cortrak tube.   If the tube becomes dislodged please keep the tube and contact the Cortrak team at [email or web address] ([email or web address]) for replacement.  If after hours and replacement cannot be delayed, place a NG tube and confirm placement with an abdominal x-ray.    Cammy Copa., RD, LDN, CNSC See AMiON for contact information

## 2021-03-31 NOTE — Progress Notes (Incomplete)
STUDENT FOLLOW-UP NOTES - DISREGARD                          CC/HPI:  43 YOM presented to [facility] on 1/19 for out-of-hospital cardiac arrest due to suspected ACEi induced angioedema leading to hypoxic respiratory failure. CTH concerning for anoxic brain injury progression and repeat EEG shows myoclonic seizures.    PMH: HTN  Dx: Cardiac arrest   Weekend A: -continued myoclonic seizures Saturday, Clonazepam added, no acute issues Sunday -palliative care consulted and spouse is unable to commit at this time -OG tube >> cortrak -valproate level therapeutic (56) -small volume of blood ETT secretions overnight; suctioning too hard -SBPs better controlled, CBGs below goal   PLAN:  -f/u Na level and adjust FW PRN (started 1/28)  - q4h -f/u trach and peg -f/u VPA level tomorrow (steady state)   Anticoag: SCDs, Heparin q8h Hgb 12.7 >> 12.8, Plts 277 >> 303  ID: WBC 13.4 >> 15.4, Tmax 99.2 1/30 WBC ***, Tmax 100.7  Antimicrobials this admission:  Ceftriaxone  1/21 >> 1/26  Microbiology results: 1/21 BCx: no growth  CV: SBP goal < 170; LVEF < 20%,  1/27 SBPs bordering goal, started on Labetalol 50mg  BID 1/30 SBPs 140-167, room to increase Hydralazine but generally avoid > 200mg  TDD  Endocrine: A1c 5.4, CBG goal 140-180 1/27 CBGs below goal 1/30 CBGs below goal  GI/Nutrition: LBM 1/26, TF, PPI/Docusate/Miralax, likely needs PEG 1/30 LBM this morning, fecal management system 1/19  Neuro: CPOT 0, RASS -5, GCS 6, goal VPA 50-100 Keppra 500mg  BID, VPA 500mg  TID Myoclonic eye twitching, no purposeful movement, cEEG Check VPA level (low), adjust PRN  -recheck 24h 1/30 RASS -4  Renal: Mg goal >2, SCr 1.71, CrCl 62.8, K 3.7 >> 4.4; Na 148 on FW: 2/19; catheter 1/27 1/29 Na 148 1/30 Na ***  Pulm: intubated 1/19, FiO2 30%; likely needs trach Improving perihilar atelectasis 1/30 FiO2 30  Heme/Onc:   PTA Med Issues:   Additional Notes:             1/19 admission, left heart cath without significant CAD.  Concern for anoxic brain injury 1/20: MRI pending, Neuro following 1/21: MRI shows areas of hypoxic-ischemic injury bilaterally. Fevered; cultures obtained. 1/23 severe brain damage 1/24 evidence of severe brain damage, remains critically ill 1/25: Plan for PEG today. Neuro exam unchanged.  Does exhibit new eyelid tremor like movements and lip/jaw tremor like vs clonus movement.  Bolused Keppra and increased maintenance dose.  Repeat EEG. CTH: concerning for progression of anoxic injury.  1/26: Repeat EEG unchanged and continues to show myoclonic seizures despite addition of Valproic acid yesterday. transfer to [facility] for continuous EEG

## 2021-04-01 ENCOUNTER — Inpatient Hospital Stay (HOSPITAL_COMMUNITY): Payer: BC Managed Care – PPO

## 2021-04-01 DIAGNOSIS — G253 Myoclonus: Secondary | ICD-10-CM

## 2021-04-01 DIAGNOSIS — J9601 Acute respiratory failure with hypoxia: Secondary | ICD-10-CM

## 2021-04-01 DIAGNOSIS — I469 Cardiac arrest, cause unspecified: Secondary | ICD-10-CM | POA: Diagnosis not present

## 2021-04-01 DIAGNOSIS — N179 Acute kidney failure, unspecified: Secondary | ICD-10-CM | POA: Diagnosis not present

## 2021-04-01 DIAGNOSIS — G931 Anoxic brain damage, not elsewhere classified: Secondary | ICD-10-CM | POA: Diagnosis not present

## 2021-04-01 DIAGNOSIS — Z7189 Other specified counseling: Secondary | ICD-10-CM

## 2021-04-01 LAB — BASIC METABOLIC PANEL
Anion gap: 11 (ref 5–15)
BUN: 56 mg/dL — ABNORMAL HIGH (ref 6–20)
CO2: 25 mmol/L (ref 22–32)
Calcium: 9 mg/dL (ref 8.9–10.3)
Chloride: 114 mmol/L — ABNORMAL HIGH (ref 98–111)
Creatinine, Ser: 1.85 mg/dL — ABNORMAL HIGH (ref 0.61–1.24)
GFR, Estimated: 44 mL/min — ABNORMAL LOW (ref >60)
Glucose, Bld: 151 mg/dL — ABNORMAL HIGH (ref 70–99)
Potassium: 4.1 mmol/L (ref 3.5–5.1)
Sodium: 150 mmol/L — ABNORMAL HIGH (ref 135–145)

## 2021-04-01 LAB — CBC WITH DIFFERENTIAL/PLATELET
Abs Immature Granulocytes: 0.17 10*3/uL — ABNORMAL HIGH (ref 0.00–0.07)
Basophils Absolute: 0 10*3/uL (ref 0.0–0.1)
Basophils Relative: 0 %
Eosinophils Absolute: 0.1 10*3/uL (ref 0.0–0.5)
Eosinophils Relative: 1 %
HCT: 39.1 % (ref 39.0–52.0)
Hemoglobin: 12.6 g/dL — ABNORMAL LOW (ref 13.0–17.0)
Immature Granulocytes: 1 %
Lymphocytes Relative: 15 %
Lymphs Abs: 2.1 10*3/uL (ref 0.7–4.0)
MCH: 30.4 pg (ref 26.0–34.0)
MCHC: 32.2 g/dL (ref 30.0–36.0)
MCV: 94.4 fL (ref 80.0–100.0)
Monocytes Absolute: 1.3 10*3/uL — ABNORMAL HIGH (ref 0.1–1.0)
Monocytes Relative: 10 %
Neutro Abs: 10.2 10*3/uL — ABNORMAL HIGH (ref 1.7–7.7)
Neutrophils Relative %: 73 %
Platelets: 253 10*3/uL (ref 150–400)
RBC: 4.14 MIL/uL — ABNORMAL LOW (ref 4.22–5.81)
RDW: 13.7 % (ref 11.5–15.5)
WBC: 13.9 10*3/uL — ABNORMAL HIGH (ref 4.0–10.5)
nRBC: 0 % (ref 0.0–0.2)

## 2021-04-01 LAB — GLUCOSE, CAPILLARY
Glucose-Capillary: 138 mg/dL — ABNORMAL HIGH (ref 70–99)
Glucose-Capillary: 123 mg/dL — ABNORMAL HIGH (ref 70–99)
Glucose-Capillary: 145 mg/dL — ABNORMAL HIGH (ref 70–99)
Glucose-Capillary: 133 mg/dL — ABNORMAL HIGH (ref 70–99)
Glucose-Capillary: 118 mg/dL — ABNORMAL HIGH (ref 70–99)
Glucose-Capillary: 132 mg/dL — ABNORMAL HIGH (ref 70–99)

## 2021-04-01 LAB — MAGNESIUM: Magnesium: 2.6 mg/dL — ABNORMAL HIGH (ref 1.7–2.4)

## 2021-04-01 MED ORDER — FREE WATER
250.0000 mL | Status: DC
  Administered 2021-04-01 – 2021-04-04 (×18): 250 mL

## 2021-04-01 MED ORDER — CLONAZEPAM 0.25 MG PO TBDP
0.5000 mg | ORAL_TABLET | Freq: Three times a day (TID) | ORAL | Status: DC
  Administered 2021-04-01 – 2021-09-13 (×494): 0.5 mg
  Filled 2021-04-01 (×21): qty 2
  Filled 2021-04-01: qty 1
  Filled 2021-04-01 (×72): qty 2
  Filled 2021-04-01: qty 1
  Filled 2021-04-01 (×78): qty 2
  Filled 2021-04-01: qty 1
  Filled 2021-04-01 (×16): qty 2
  Filled 2021-04-01: qty 1
  Filled 2021-04-01 (×3): qty 2
  Filled 2021-04-01: qty 1
  Filled 2021-04-01 (×4): qty 2
  Filled 2021-04-01: qty 1
  Filled 2021-04-01 (×8): qty 2
  Filled 2021-04-01: qty 1
  Filled 2021-04-01 (×8): qty 2
  Filled 2021-04-01: qty 1
  Filled 2021-04-01 (×24): qty 2
  Filled 2021-04-01: qty 1
  Filled 2021-04-01 (×36): qty 2
  Filled 2021-04-01: qty 1
  Filled 2021-04-01 (×10): qty 2
  Filled 2021-04-01: qty 1
  Filled 2021-04-01 (×4): qty 2
  Filled 2021-04-01: qty 1
  Filled 2021-04-01 (×30): qty 2
  Filled 2021-04-01: qty 1
  Filled 2021-04-01 (×27): qty 2
  Filled 2021-04-01: qty 1
  Filled 2021-04-01 (×13): qty 2
  Filled 2021-04-01: qty 1
  Filled 2021-04-01: qty 2
  Filled 2021-04-01: qty 1
  Filled 2021-04-01 (×9): qty 2
  Filled 2021-04-01: qty 1
  Filled 2021-04-01 (×4): qty 2
  Filled 2021-04-01: qty 1
  Filled 2021-04-01 (×12): qty 2
  Filled 2021-04-01: qty 1
  Filled 2021-04-01 (×20): qty 2
  Filled 2021-04-01: qty 1
  Filled 2021-04-01: qty 2
  Filled 2021-04-01: qty 1
  Filled 2021-04-01 (×5): qty 2
  Filled 2021-04-01: qty 1
  Filled 2021-04-01 (×54): qty 2
  Filled 2021-04-01: qty 1
  Filled 2021-04-01 (×13): qty 2

## 2021-04-01 NOTE — Progress Notes (Signed)
NAME:  Billy Cherry, MRN:  [id number], DOB:  1970-12-21, LOS: 2 ADMISSION DATE:  03/30/2021, CONSULTATION DATE:  1/19 REFERRING MD:  Lorinda Creed, CHIEF COMPLAINT:  Found down, cardiac arrest   Brief Pt Description / Synopsis:  51 year old male with out-of-hospital V. fib cardiac arrest in setting of respiratory arrest due to suspected angioedema from ACE inhibitor leading to asphyxiation. Cardiac catheterization without evidence of significant coronary artery disease. Now with concern for anoxic brain injury.  He remains critically ill. Hospital day 8. Day 1 at [facility].  History of Present Illness:  51 y/o male was found down by his wife this evening with agonal respirations.  911 called, received out of hospital CPR, 1 shock with defibrillator, 2 doses of epinephrine.  Had return of spontaneous circulation.  Concern on his 12 lead EKG for anterolateral ST elevation so he was taken to the cath lab where his study showed no significant coronary artery disease and an LVEF 45-50%   He remained encephalopathic post cardiac arrest so PCCM was consulted.  Family notes he rarely drinks alcohol, doesn't smoke, doesn't use illicit drugs.  Takes blood pressure medicine at home, may have run out recently.   Pertinent  Medical History  Hypertension  Micro Data:  1/19 SARS CoV2/Flu > negative 1/19: MRSA PCR>> negative 1/21: Tracheal aspirate>> normal respiratory flora 1/21: Blood culture x2>> no growth   Antimicrobials:  Ceftriaxone 1/21>>1/25  Significant Hospital Events: Including procedures, antibiotic start and stop dates in addition to other pertinent events   1/19 admission, left heart cath without significant CAD.  Concern for anoxic brain injury 1/20: MRI pending, Neuro following 1/21: MRI shows areas of hypoxic-ischemic injury bilaterally. Fevered; cultures obtained. 1/23 severe brain damage 1/24 evidence of severe brain damage, remains critically ill 1/25: Plan for PEG today. Neuro exam  unchanged.  Does exhibit new eyelid tremor like movements and lip/jaw tremor like vs clonus movement.  Bolused Keppra and increased maintenance dose.  Repeat EEG. CTH: concerning for progression of anoxic injury.  1/26: Repeat EEG unchanged and continues to show myoclonic seizures despite addition of Valproic acid yesterday. transfer to [facility] for continuous EEG 1/28: Continues to have myoclonic sz on EEG  Interim History / Subjective:  No acute events overnight Palliative consult yesterday.   Objective   Blood pressure 109/67, pulse 78, temperature 99.3 F (37.4 C), temperature source Oral, resp. rate (!) 21, weight 104.2 kg, SpO2 99 %.    Vent Mode: PRVC FiO2 (%):  [30 %] 30 % Set Rate:  [20 bmp] 20 bmp Vt Set:  [500 mL] 500 mL PEEP:  [5 cmH20] 5 cmH20 Plateau Pressure:  [12 cmH20-16 cmH20] 16 cmH20   Intake/Output Summary (Last 24 hours) at 04/01/2021 [id number] Last data filed at 04/01/2021 0500 Gross per 24 hour  Intake 2536 ml  Output 2400 ml  Net 136 ml     Filed Weights   04/01/21 0500  Weight: 104.2 kg     Examination:  General:  middle aged overweight male in NAD on vent Neuro:  Roving eye movements, cough, no gag. Weak extension of upper extremities to pain. NO eye opening.  HEENT:  [place]/AT, No JVD noted Cardiovascular:  RRR, no MRG Lungs:  Clear bilateral breath sounds Abdomen:  Soft, non-distended, non-tender.  Musculoskeletal:  No acute deformity or ROM limitation.  Skin:  Intact, MMM   Labs:  Sodium 148 >150 Creatinine  1.7 >1.85   Resolved Hospital Problem list     Assessment & Plan:  V.  Fib arrest, left heart cath without significant coronary artery disease ~ the suspicion is pt may have developed mild Angioedema due to home Lisinopril leading to airway obstruction with respiratory arrest Acute HFrEF (LVEF <20%, LV with severely decreased function, Grade II diastolic dysfunction, global hypokinesis, trivial mitral regurgitation, ) Hypertension> BP  Goal is < 123XX123 systolic Echocardiogram 0000000 LVEF <20% with global hypokinesis, Grade II diastolic dysfunction, RV function normal. UDS negative. ETOH negative. Cardiac cath without significant CAD. -Cardiology consulted and following at [facility]. Will re-engage if needed -Continue coreg, hydralazine, and imdur -Continue continuous tele and spo2 monitoring -Do not restart ACEi or ARB.    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 MRI brain consistent with hypoxic ischemic injury involving bilateral ACA-MCA watershed cortex, bilateral occipital cortex and basal ganglia. 1/25 CTH: concerning for progression of anoxic injury. EEG with myoclonic sz activity.  -Neuro consulted and following. Transferred to [facility] for EEGc>ongoing. - AED per neuro. Keppra, Depakote.  - Palliative involved. Continue full aggressive measures.     Acute Hypoxic Respiratory Failure in the setting of cardiac arrest & Anoxic Brain Injury -Full vent support -VAP bundle -Daily SBT when appropriate -PRN sedation only, has not been necessary.  -RASS goal 0 to -1  HTN Goal < 123XX123 systolic Plan Will increase apresoline to 50 mg Q 8 Continue Coreg 25 mg BID Continue prn apresoline and labetalol for breakthrough SBP > 170   Acute Kidney Injury Hypernatremia -Ensure renal perfusion. Goal MAP 65 or greater. -Avoid neprotoxic drugs as possible. -Strict I&O's -Trend BMET -Increase free water   Leukocytosis, suspect due to Aspiration Pneumonia CXR 1/26>> Improving perihilar atelectasis. S/P 5 day course of ceftriaxone -Follow cultures -Trend WBC/Fever curve  Nutrition Plan TF at goal. Pt. tolerating   Hyperglycemia -Blood Glucose goal 140-180. -SSI  Best Practice (right click and "Reselect all SmartList Selections" daily)   Diet/type: tubefeeds DVT prophylaxis: Lovenox GI prophylaxis: PPI Lines: N/A Foley:  Yes, and it is still  needed Code Status:  full code Last date of multidisciplinary goals of care discussion [1/26]   Critical care time: 38 minutes      Georgann Housekeeper, AGACNP-BC [place] for personal pager PCCM on call pager [phone] until 7pm. Please call Elink 7p-7a. [phone]  04/01/2021 7:39 AM

## 2021-04-01 NOTE — Progress Notes (Signed)
LTM maint complete - no skin breakdown under: Fp1 F3F7 .[facility] monitored, Event button test confirmed by [facility].  

## 2021-04-01 NOTE — Progress Notes (Signed)
LTM maintain at bedside. No skin breakdown noted. Results pending.  °

## 2021-04-01 NOTE — Progress Notes (Signed)
Chaplain responded to Spiritual Consult for support of pt's wife who was not at bedside at time of visit.  Chaplain requesting to be paged at [phone] when wife returns.  [place]

## 2021-04-01 NOTE — Procedures (Addendum)
Patient Name: Billy Cherry  MRN: [id number]  Epilepsy Attending: Charlsie Quest  Referring Physician/Provider: Dr Milon Dikes Duration: 03/31/2021 1719 to 04/01/2021  1719   Patient history: 51 year old male status post cardiac arrest now noted to have eye twitching/remor like movements and rhythmic lip & tongue fasciculations.  EEG evaluate for seizure.   Level of alertness: comatose   AEDs during EEG study: LEV, VPA   Technical aspects: This EEG study was done with scalp electrodes positioned according to the 10-20 International system of electrode placement. Electrical activity was acquired at a sampling rate of 500Hz  and reviewed with a high frequency filter of 70Hz  and a low frequency filter of 1Hz . EEG data were recorded continuously and digitally stored.    Description: EEG showed continuous generalized 5 to 7 Hz theta slowing.  Generalized periodic discharges were also noted at 1.5 to 2 Hz predominantly when patient was awake/stimulated.  This EEG is consistent with stimulation induced periodic discharges.   Patient was noted to have episodes of eye opening during the study, more prominent when stimulated. Concomitant EEG showed generalized spikes consistent with myoclonic seizure.    Hyperventilation and photic stimulation were not performed.      ABNORMALITY -Myoclonic seizure, generalized -Stimulation induced periodic discharges, generalized -Continuous slow, generalized   IMPRESSION: This study showed episodes of eye opening consistent with myoclonic seizures.  Additionally EEG suggestive of epileptogenicity with generalized onset and high potential for seizure recurrence.  Lastly, EEG was suggestive of severe diffuse encephalopathy.  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   Marri Mcneff 

## 2021-04-01 NOTE — Progress Notes (Signed)
Neurology Progress Note  Subjective: Patient continues to have episodes of eye twitching predominantly when stimulated with some generalized myoclonus involving his right arm, abdomen, and intermittently and subtly in BLE.  ROS: Unable to obtain due to poor mental status  Examination  Vital signs in last 24 hours: Temp:  [98.2 F (36.8 C)-99.3 F (37.4 C)] 98.9 F (37.2 C) (01/28 0800) Pulse Rate:  [73-94] 73 (01/28 0730) Resp:  [17-22] 20 (01/28 0730) BP: (100-146)/(57-99) 146/93 (01/28 0730) SpO2:  [92 %-100 %] 99 % (01/28 0723) FiO2 (%):  [30 %] 30 % (01/28 0723) Weight:  [104.2 kg] 104.2 kg (01/28 0500)  General: Intubated, not on sedation in the ICU CVS: pulse-normal rate and rhythm RS: Respirations assisted via mechanical ventilation, oral ETT in place, secured Extremities: Warm, no edema  Neuro: Mental Status: Patient is intubated, not on sedation in the ICU.  He does not follow commands.  He does not respond to noxious stimuli throughout.   Cranial Nerves: II: Pupils are equal, round, and reactive to light.   III,IV, VI: With passive eye opening patient has random horizontal and vertical eye movements that are not typical of horizontal roving eye movements V: Corneal reflexes intact, does not blink to threat throughout VII: No facial movement noted throughout assessment, full assessment obscured via ETT and securement device VIII: Unable to assess due to patient's condition  X: Cough reflex is intact XI, XII: Does not perform  Motor: No response to noxious stimuli throughout Sensory: No response to noxious stimulation throughout Cerebellar: Unable to assess  Basic Metabolic Panel: Recent Labs  Lab 03/27/21 0321 03/28/21 0538 03/29/21 0615 03/30/21 0428 03/31/21 0334 04/01/21 0237  NA 148* 151* 150* 148* 148* 150*  K 4.0 4.2 3.8 3.7 4.4 4.1  CL 115* 116* 115* 111 111 114*  CO2 25 26 26 26 27 25   GLUCOSE 129* 125* 144* 142* 129* 151*  BUN 74* 78* 60*  54* 55* 56*  CREATININE 1.74* 1.90* 1.68* 1.76* 1.71* 1.85*  CALCIUM 8.8* 9.0 9.2 9.1 9.1 9.0  MG 2.9* 2.8* 2.4 2.4 2.3 2.6*  PHOS 4.5 4.0 3.9 4.2 5.0*  --    CBC: Recent Labs  Lab 03/28/21 0538 03/29/21 0615 03/30/21 0428 03/31/21 0334 04/01/21 0237  WBC 13.3* 13.6* 13.4* 15.2* 13.9*  NEUTROABS  --   --   --   --  10.2*  HGB 12.3* 12.5* 12.7* 12.8* 12.6*  HCT 38.6* 39.9 40.0 39.8 39.1  MCV 93.7 95.5 94.8 94.8 94.4  PLT 245 254 277 303 253   Coagulation Studies: No results for input(s): LABPROT, INR in the last 72 hours.  Imaging CT head without contrast 03/29/2021: Overall unchanged exam compared to 03/23/2021, with possible sulcal crowding but preservation of gray-white differentiation.  MRI brain without contrast 03/24/2021: Findings consistent with hypoxic ischemic injury involving the bilateral ACA-MCA watershed cortex, bilateral occipital cortex and basal ganglia.    ASSESSMENT AND PLAN: 51 year old male status post cardiac arrest on 03/22/2021.  Continues to have myoclonic seizures in spite of being on Keppra, Depakote.  He has been off of propofol/sedation.  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 Additionally EEG suggestive of epileptogenicity with generalized onset and high potential for seizure recurrence.  Lastly, EEG was suggestive of severe diffuse encephalopathy.  In the setting of cardiac arrest, this is most likely due to anoxic/hypoxic  brain injury."  Recommendations -Depakote level was subtherapeutic, increased Depakote to 750 mg 3 times daily on 1/27 -Continue Keppra 750 mg twice daily -Add Klonopin p.o./per tube -Continue LTM EEG overnight to monitor for intermittent seizures  - Palliative on board to support patient's wife, may readdress goals of care  discussion next week with Dr. Hortense Ramal. -Attempt to control the seizures is only going to treat the symptomatology but not the underlying anoxic brain damage, which unfortunately may not be something that the patient either might survive or if he survives might end up being in persistent vegetative state is best case scenario if he is able to become any better. -Continue seizure precautions -Minimize sedative medications as you are -Management of rest of comorbidities per primary team  Anibal Henderson, AGACNP-BC [facility]  [phone]   Attending Neurohospitalist Addendum Patient seen and examined with APP/Resident. Agree with the history and physical as documented above. Agree with the plan as documented, which I helped formulate. I have independently reviewed the chart, obtained history, review of systems and examined the patient.I have personally reviewed pertinent head/neck/spine imaging (CT/MRI). Please feel free to call with any questions.  -- Amie Portland, MD Neurologist [facility] Pager: [phone]  CRITICAL CARE ATTESTATION Performed by: Amie Portland, MD Total critical care time: 31 minutes Critical care time was exclusive of separately billable procedures and treating other patients and/or supervising APPs/Residents/Students Critical care was necessary to treat or prevent imminent or life-threatening deterioration due to anoxic brain injury This patient is critically ill and at significant risk for neurological worsening and/or death and care requires constant monitoring. Critical care was time spent personally by me on the following activities: development of treatment plan with patient and/or surrogate as well as nursing, discussions with consultants, evaluation of patient's response to treatment, examination of patient, obtaining history from patient or surrogate, ordering and performing treatments and interventions, ordering and review of  laboratory studies, ordering and review of radiographic studies, pulse oximetry, re-evaluation of patient's condition, participation in multidisciplinary rounds and medical decision making of high complexity in the care of this patient.

## 2021-04-01 NOTE — Progress Notes (Signed)
LTM maint complete - no skin breakdown under: Fp2 F4 F8 [facility] monitored, Event button test confirmed by [facility].  

## 2021-04-01 NOTE — Consult Note (Signed)
Palliative Medicine Inpatient Consult Note  Consulting Provider: Bevelyn Ngo, NP  Reason for consult:   Palliative Care Consult Services Palliative Medicine Consult  Reason for Consult? Anoxic brain damage, Goals of care  Comments  Cardiac arrest 2/2 Resp arrest ( Due to Angioedema from ACE). Continued myoclonic seizures. Family want aggressive care. Most likely will need trach and PEG and LTAC. Does still have Gag , cough and corneal reflexes.   HPI:  Per progress note(s)-->  51 year old male with out-of-hospital V. fib cardiac arrest in setting of respiratory arrest due to suspected angioedema from ACE inhibitor leading to asphyxiation. Cardiac catheterization without evidence of significant coronary artery disease. Now with concern for anoxic brain injury.  Palliative care has been asked to get involved to discuss goals of care in the setting of a cardiac arrest.  Clinical Assessment/Goals of Care:  *Please note that this is a verbal dictation therefore any spelling or grammatical errors are due to the "Dragon Medical One" system interpretation.  I have reviewed medical records including EPIC notes, labs and imaging, received report from bedside RN, assessed the patient who is intubated not sedated with no ability to follow commands or respond.    I called patient's spouse,  Theola Sequin to further discuss diagnosis prognosis, GOC, EOL wishes, disposition and options.   I introduced Palliative Medicine as specialized medical care for people living with serious illness. It focuses on providing relief from the symptoms and stress of a serious illness. The goal is to improve quality of life for both the patient and the family.  Medical History Review and Understanding:  Retillas and I reviewed her husband history of hypertension.  We discussed his recent hospitalization in the setting of a respiratory and cardiac arrest.  Reviewed that he has experienced exceptional compromise to  his cardiovascular system, neurological system as well as kidneys.  Reviewed that this is very worrisome in the setting of his long-term prognosis.  Social History:  [place] lives in [place].  He has been married to [place] for the past 27 years.  They share 3 children and 5 grandchildren.  They have custody of one of their grandchildren who is 68 years old named major.  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  He values faith and his family.  He is a man of faith and practices within the [employer] denomination.  Functional and Nutritional State:  Prior to patient's arrest he was fully functional of all B ADLs and IADLs.  Demetres had a hearty appetite prior to admission.  Advance Directives: A detailed discussion was had today regarding advanced directives.  Advance directives were never completed though patient's spouse, Welford Roche is his primary decision maker by law.  Code Status: Concepts specific to code status, artifical feeding and hydration, continued IV antibiotics and rehospitalization was had.  At this time being that all of the events which have occurred have been very sudden, Retillas wants to continue with full CODE STATUS and full scope of care.  We reviewed the idea of a tracheostomy and gastrostomy tube which she would like to proceed with at this time.  Goals for the Future:  Retillas is very hopeful for improvements.  I shared with her in a very gentle fashion that the improvement she is hoping for may not occur.  She was extremely tearful with  me on the phone and expressed devout faith in God and the miracles he can perform.  She is very hopeful that I will be able to bring Argenis back to a functional position.  I offered support through therapeutic listening.  Discussed the importance of continued  conversation with family and their  medical providers regarding overall plan of care and treatment options, ensuring decisions are within the context of the patients values and GOCs.  Decision Maker: Theola Sequin (spouse) [phone]  SUMMARY OF RECOMMENDATIONS   Full Code / Full Scope of Care  Patients spouse would like to proceed with Tracheostomy and PEG placement  Ongoing GOC conversations throughout hospitalization  Chaplain support will be needed and appreciated  Palliative support will continue during [facility] hospitalization  Code Status/Advance Care Planning: FULL CODE  Palliative Prophylaxis:  Aspiration, Bowel Regimen, Delirium Protocol, Frequent Pain Assessment, Oral Care, Palliative Wound Care, and Turn Reposition  Additional Recommendations (Limitations, Scope, Preferences):  Full Scope Treatment   Psycho-social/Spiritual:  Desire for further Chaplaincy support:  Additional Recommendations:    Prognosis: Overall very poor in the setting of anoxic brain injury.  Discharge Planning:   Vitals:   04/01/21 1055 04/01/21 1100  BP:  127/77  Pulse: 91 88  Resp: 20 (!) 21  Temp:    SpO2: 99% 98%    Intake/Output Summary (Last 24 hours) at 04/01/2021 1200 Last data filed at 04/01/2021 0800 Gross per 24 hour  Intake 1988 ml  Output 2650 ml  Net -662 ml   Last Weight  Most recent update: 04/01/2021  5:35 AM    Weight  104.2 kg (229 lb 11.5 oz)            Gen:  Middle aged AA M intubated HEENT: Intubated, coretrack, drymucous membranes CV: Regular rate and rhythm  PULM: Mechanical ventilator ABD: soft/nontender  EXT: No edema  Neuro: Somnolent on no sedation not following commands  PPS: 10%   This conversation/these recommendations were discussed with patient primary care team, Dr. Denese Killings  MDM High  Medical Decision Making:4 #/Complex Problems:4                      Data Reviewed:4                 Management:4 (1-Straightforward, 2-Low,  3-Moderate, 4-High) ______________________________________________________ Lamarr Lulas [place] Palliative Medicine Team Team Cell Phone: [phone] Please utilize secure chat with additional questions, if there is no response within 30 minutes please call the above phone number  Palliative Medicine Team providers are available by phone from 7am to 7pm daily and can be reached through the team cell phone.  Should this patient require assistance outside of these hours, please call the patient's attending physician.

## 2021-04-02 DIAGNOSIS — N179 Acute kidney failure, unspecified: Secondary | ICD-10-CM | POA: Diagnosis not present

## 2021-04-02 DIAGNOSIS — I469 Cardiac arrest, cause unspecified: Secondary | ICD-10-CM | POA: Diagnosis not present

## 2021-04-02 DIAGNOSIS — G253 Myoclonus: Secondary | ICD-10-CM | POA: Diagnosis not present

## 2021-04-02 DIAGNOSIS — G931 Anoxic brain damage, not elsewhere classified: Secondary | ICD-10-CM | POA: Diagnosis not present

## 2021-04-02 LAB — CBC
HCT: 38.6 % — ABNORMAL LOW (ref 39.0–52.0)
Hemoglobin: 12 g/dL — ABNORMAL LOW (ref 13.0–17.0)
MCH: 30.2 pg (ref 26.0–34.0)
MCHC: 31.1 g/dL (ref 30.0–36.0)
MCV: 97 fL (ref 80.0–100.0)
Platelets: 230 10*3/uL (ref 150–400)
RBC: 3.98 MIL/uL — ABNORMAL LOW (ref 4.22–5.81)
RDW: 13.9 % (ref 11.5–15.5)
WBC: 15.4 10*3/uL — ABNORMAL HIGH (ref 4.0–10.5)
nRBC: 0 % (ref 0.0–0.2)

## 2021-04-02 LAB — GLUCOSE, CAPILLARY
Glucose-Capillary: 119 mg/dL — ABNORMAL HIGH (ref 70–99)
Glucose-Capillary: 129 mg/dL — ABNORMAL HIGH (ref 70–99)
Glucose-Capillary: 136 mg/dL — ABNORMAL HIGH (ref 70–99)
Glucose-Capillary: 125 mg/dL — ABNORMAL HIGH (ref 70–99)
Glucose-Capillary: 123 mg/dL — ABNORMAL HIGH (ref 70–99)
Glucose-Capillary: 143 mg/dL — ABNORMAL HIGH (ref 70–99)

## 2021-04-02 LAB — COMPREHENSIVE METABOLIC PANEL
ALT: 20 U/L (ref 0–44)
AST: 36 U/L (ref 15–41)
Albumin: 2.5 g/dL — ABNORMAL LOW (ref 3.5–5.0)
Alkaline Phosphatase: 36 U/L — ABNORMAL LOW (ref 38–126)
Anion gap: 9 (ref 5–15)
BUN: 56 mg/dL — ABNORMAL HIGH (ref 6–20)
CO2: 25 mmol/L (ref 22–32)
Calcium: 8.6 mg/dL — ABNORMAL LOW (ref 8.9–10.3)
Chloride: 114 mmol/L — ABNORMAL HIGH (ref 98–111)
Creatinine, Ser: 1.78 mg/dL — ABNORMAL HIGH (ref 0.61–1.24)
GFR, Estimated: 46 mL/min — ABNORMAL LOW (ref >60)
Glucose, Bld: 139 mg/dL — ABNORMAL HIGH (ref 70–99)
Potassium: 4 mmol/L (ref 3.5–5.1)
Sodium: 148 mmol/L — ABNORMAL HIGH (ref 135–145)
Total Bilirubin: 0.5 mg/dL (ref 0.3–1.2)
Total Protein: 6.3 g/dL — ABNORMAL LOW (ref 6.5–8.1)

## 2021-04-02 LAB — VALPROIC ACID LEVEL: Valproic Acid Lvl: 56 ug/mL (ref 50.0–100.0)

## 2021-04-02 LAB — MAGNESIUM: Magnesium: 2.5 mg/dL — ABNORMAL HIGH (ref 1.7–2.4)

## 2021-04-02 LAB — PHOSPHORUS: Phosphorus: 4.7 mg/dL — ABNORMAL HIGH (ref 2.5–4.6)

## 2021-04-02 NOTE — Progress Notes (Signed)
Palliative Medicine Inpatient Follow Up Note  HPI:  Per progress note(s)-->  51 year old male with out-of-hospital V. fib cardiac arrest in setting of respiratory arrest due to suspected angioedema from ACE inhibitor leading to asphyxiation. Cardiac catheterization without evidence of significant coronary artery disease. Now with concern for anoxic brain injury.   Palliative care has been asked to get involved to discuss goals of care in the setting of a cardiac arrest.  Today's Discussion (04/02/2021):  *Please note that this is a verbal dictation therefore any spelling or grammatical errors are due to the "Dragon Medical One" system interpretation.  Chart reviewed inclusive of vital signs, progress notes, laboratory results, and diagnostic images.   I called patients spouse, Retillas this afternoon. She shares with me that she is not doing well. She expresses that she is having a hard time coming to grips with [place] present situation. She expresses that it is burdening to hold her job and try to arrange to now take over [employer]) and responsibilities. She expresses a deep faith in God to help [place](s) situation.   Gently broached concern of perhaps [place] not improving. Right now though, Retillas is not in a place to consider this as per our conversation, "It's only been a week."   Retillas does share that she has spoken to Dr. Melynda Ripple and believes that with the room quiet and a lack of stimulation that he will improve. I shared that I am not sure this is what was meant regarding this but I will follow up to better clarify the message which was being conveyed.   Offered emotional support through therapeutic listening.  Questions and concerns addressed   Palliative Support Provided.   Objective Assessment: Vital Signs Vitals:   04/02/21 1408 04/02/21 1411  BP:  127/75  Pulse: 77   Resp: (!) 21   Temp:    SpO2: 100%     Intake/Output Summary (Last 24 hours) at  04/02/2021 1551 Last data filed at 04/02/2021 1400 Gross per 24 hour  Intake 3096 ml  Output 2000 ml  Net 1096 ml   Last Weight  Most recent update: 04/02/2021  3:34 AM    Weight  104.6 kg (230 lb 9.6 oz)            Gen:  Middle aged AA M intubated HEENT: Intubated, coretrack, drymucous membranes CV: Regular rate and rhythm  PULM: Mechanical ventilator ABD: soft/nontender  EXT: No edema  Neuro: Somnolent, not following any commands  SUMMARY OF RECOMMENDATIONS   Full Code / Full Scope of Care   Patients spouse would like to proceed with Tracheostomy and PEG placement   Ongoing chaplain support will be needed and appreciated  A multidisciplinary meeting will need to be had at some point if patient continues to neglect in improvements --> patients spouse does not seem able to commit to a time for this presently   Palliative support will continue during [place] hospitalization  MDM - 4  Medical Decision Making:4 #/Complex Problems: 4                     Data Reviewed:    4             Management: 4 (1-Straightforward, 2-Low, 3-Moderate, 4-High) ______________________________________________________________________________________ Lamarr Lulas [place] Palliative Medicine Team Team Cell Phone: [phone] Please utilize secure chat with additional questions, if there is no response within 30 minutes please call the above phone number  Palliative Medicine Team providers are available  by phone from 7am to 7pm daily and can be reached through the team cell phone.  Should this patient require assistance outside of these hours, please call the patient's attending physician.

## 2021-04-02 NOTE — Progress Notes (Signed)
Neurology Progress Note  Subjective: Patient with improvement in twitching from yesterday's examination without any noted myoclonus on physical examination today. He remains intubated and has not been on sedation in the ICU.   ROS: Unable to obtain due to poor mental status  Examination  Vital signs in last 24 hours: Temp:  [98.8 F (37.1 C)-101.5 F (38.6 C)] 98.8 F (37.1 C) (01/29 0800) Pulse Rate:  [71-98] 79 (01/29 0709) Resp:  [19-24] 24 (01/29 0709) BP: (102-152)/(64-107) 138/98 (01/29 0700) SpO2:  [90 %-100 %] 100 % (01/29 0709) FiO2 (%):  [30 %] 30 % (01/29 0800) Weight:  [104.6 kg] 104.6 kg (01/29 0334)  General: Intubated, not on sedation in the ICU CVS: pulse-normal rate and rhythm RS: Respirations assisted via mechanical ventilation, oral ETT in place, secured Extremities: Warm, no edema  Neuro: Mental Status: Patient is intubated, not on sedation in the ICU.  He does not follow commands.  He does have extensor posturing of bilateral upper and lower extremities with sternal rub on examination today.  Cranial Nerves: II: Pupils are equal, round, and reactive to light.   III,IV, VI: With passive eye opening patient has random horizontal and vertical eye movements that are not typical of horizontal roving eye movements V: Corneal reflexes intact, does not blink to threat throughout VII: No facial movement noted throughout assessment, full assessment obscured via ETT and securement device VIII: Unable to assess due to patient's condition  X: Cough reflex is intact XI, XII: Does not perform  Motor: He does have extensor posturing of bilateral upper and lower extremities with sternal rub on examination today.  Sensory: As above. Cerebellar: Unable to assess  Basic Metabolic Panel: Recent Labs  Lab 03/28/21 0538 03/29/21 0615 03/30/21 0428 03/31/21 0334 04/01/21 0237 04/02/21 0233  NA 151* 150* 148* 148* 150* 148*  K 4.2 3.8 3.7 4.4 4.1 4.0  CL 116* 115*  111 111 114* 114*  CO2 26 26 26 27 25 25   GLUCOSE 125* 144* 142* 129* 151* 139*  BUN 78* 60* 54* 55* 56* 56*  CREATININE 1.90* 1.68* 1.76* 1.71* 1.85* 1.78*  CALCIUM 9.0 9.2 9.1 9.1 9.0 8.6*  MG 2.8* 2.4 2.4 2.3 2.6* 2.5*  PHOS 4.0 3.9 4.2 5.0*  --  4.7*   CBC: Recent Labs  Lab 03/29/21 0615 03/30/21 0428 03/31/21 0334 04/01/21 0237 04/02/21 0233  WBC 13.6* 13.4* 15.2* 13.9* 15.4*  NEUTROABS  --   --   --  10.2*  --   HGB 12.5* 12.7* 12.8* 12.6* 12.0*  HCT 39.9 40.0 39.8 39.1 38.6*  MCV 95.5 94.8 94.8 94.4 97.0  PLT 254 277 303 253 230   Coagulation Studies: No results for input(s): LABPROT, INR in the last 72 hours.  Imaging CT head without contrast 03/29/2021: Overall unchanged exam compared to 03/23/2021, with possible sulcal crowding but preservation of gray-white differentiation.  MRI brain without contrast 03/24/2021: Findings consistent with hypoxic ischemic injury involving the bilateral ACA-MCA watershed cortex, bilateral occipital cortex and basal ganglia.    ASSESSMENT AND PLAN: 51 year old male status post cardiac arrest on 03/22/2021.  Continues to have EEG evidence of myoclonic seizures in spite of being on Keppra, Depakote, and addition of Klonopin though myoclonus seems improved on physical examination this morning.  He has been off of propofol/sedation.  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  Additionally EEG suggestive of epileptogenicity with generalized onset and high potential for seizure recurrence.  Lastly, EEG was suggestive of severe diffuse encephalopathy.  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   EEG appear to be similar to previous day. "  Recommendations -Depakote level was  subtherapeutic, increased Depakote to 750 mg 3 times daily on 1/27 -Continue Keppra 750 mg twice daily -Continue Klonopin p.o./per tube -Continue LTM EEG overnight to monitor for intermittent seizures  - Palliative on board to support patient's wife, may readdress goals of care discussion next week with Dr. Melynda Ripple. -Attempt to control the seizures is only going to treat the symptomatology but not the underlying anoxic brain damage, which unfortunately may not be something that the patient either might survive or if he survives might end up being in persistent vegetative state is best case scenario if he is able to become any better. -Continue seizure precautions -Management of rest of comorbidities per primary team  Lanae Boast, AGACNP-BC [facility]  [phone]  Attending Neurohospitalist Addendum Patient seen and examined with APP/Resident. Agree with the history and physical as documented above. Agree with the plan as documented, which I helped formulate. I have independently reviewed the chart, obtained history, review of systems and examined the patient.I have personally reviewed pertinent head/neck/spine imaging (CT/MRI). Please feel free to call with any questions.  -- Milon Dikes, MD Neurologist [facility] Pager: [phone]

## 2021-04-02 NOTE — Procedures (Addendum)
Patient Name: Billy Cherry  MRN: [id number]  Epilepsy Attending: Lora Havens  Referring Physician/Provider: Dr Amie Portland Duration: 04/01/2021 1719 to 04/02/2021 1719   Patient history: 50 year old male status post cardiac arrest now noted to have eye twitching/remor like movements and rhythmic lip & tongue fasciculations.  EEG evaluate for seizure.   Level of alertness: comatose   AEDs during EEG study: LEV, VPA, clonazepam   Technical aspects: This EEG study was done with scalp electrodes positioned according to the 10-20 International system of electrode placement. Electrical activity was acquired at a sampling rate of 500Hz  and reviewed with a high frequency filter of 70Hz  and a low frequency filter of 1Hz . EEG data were recorded continuously and digitally stored.    Description: EEG showed continuous generalized 5 to 7 Hz theta slowing.  Generalized periodic discharges were also noted at 1.5 to 2 Hz predominantly when patient was awake/stimulated.  This EEG is consistent with stimulation induced periodic discharges.   Patient was noted to have episodes of eye opening during the study, more prominent when stimulated. Concomitant EEG showed generalized spikes consistent with myoclonic seizure.    Hyperventilation and photic stimulation were not performed.      ABNORMALITY -Myoclonic seizure, generalized -Stimulation induced periodic discharges, generalized -Continuous slow, generalized   IMPRESSION: This study showed episodes of eye opening consistent with myoclonic seizures.  Additionally EEG suggestive of epileptogenicity with generalized onset and high potential for seizure recurrence.  Lastly, EEG was suggestive of severe diffuse encephalopathy.  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  EEG appear to be similar to previous day.    Koston Hennes Barbra Sarks

## 2021-04-02 NOTE — Progress Notes (Signed)
LTM maintain done at bedside. No skin breakdown noted. Results pending ° °

## 2021-04-02 NOTE — Progress Notes (Signed)
NAME:  Billy Cherry, MRN:  [id number], DOB:  1971/02/02, LOS: 3 ADMISSION DATE:  03/30/2021, CONSULTATION DATE:  1/19 REFERRING MD:  Lorinda Creed, CHIEF COMPLAINT:  Found down, cardiac arrest   Brief Pt Description / Synopsis:  51 year old male with out-of-hospital V. fib cardiac arrest in setting of respiratory arrest due to suspected angioedema from ACE inhibitor leading to asphyxiation.  Concern on his 12 lead EKG for anterolateral ST elevation so he was taken to the cath lab where his study showed no significant coronary artery disease and an LVEF 45-50%  Now with concern for anoxic brain injury.  He remains critically ill.   Pertinent  Medical History  Hypertension  Micro Data:  1/19 SARS CoV2/Flu > negative 1/19: MRSA PCR>> negative 1/21: Tracheal aspirate>> normal respiratory flora 1/21: Blood culture x2>> no growth   Antimicrobials:  Ceftriaxone 1/21>>1/25  Significant Hospital Events: Including procedures, antibiotic start and stop dates in addition to other pertinent events   1/19 admission, left heart cath without significant CAD.  Concern for anoxic brain injury 1/20: MRI pending, Neuro following 1/21: MRI shows areas of hypoxic-ischemic injury bilaterally. Fevered; cultures obtained. 1/23 severe brain damage 1/24 evidence of severe brain damage, remains critically ill 1/25: Plan for PEG today. Neuro exam unchanged.  Does exhibit new eyelid tremor like movements and lip/jaw tremor like vs clonus movement.  Bolused Keppra and increased maintenance dose.  Repeat EEG. CTH: concerning for progression of anoxic injury.  1/26: Repeat EEG unchanged and continues to show myoclonic seizures despite addition of Valproic acid yesterday. transfer to [facility] for continuous EEG 1/28: Continues to have myoclonic sz on EEG 1/29 No acute issues overnight  Interim History / Subjective:  Remains on EEG  No acute issues overnight   Objective   Blood pressure (!) 138/98, pulse 79,  temperature 98.8 F (37.1 C), temperature source Axillary, resp. rate (!) 24, weight 104.6 kg, SpO2 100 %.    Vent Mode: PRVC FiO2 (%):  [30 %] 30 % Set Rate:  [20 bmp] 20 bmp Vt Set:  [500 mL] 500 mL PEEP:  [5 cmH20] 5 cmH20 Plateau Pressure:  [14 cmH20-18 cmH20] 17 cmH20   Intake/Output Summary (Last 24 hours) at 04/02/2021 [id number] Last data filed at 04/02/2021 [id number] Gross per 24 hour  Intake 3226 ml  Output 1700 ml  Net 1526 ml     Filed Weights   04/01/21 0500 04/02/21 0334  Weight: 104.2 kg 104.6 kg     Examination:  General: Acute on chronically ill appearing middle aged male lying in bed on mechanical ventilation, in NAD HEENT: ETT, MM pink/moist, PERRL,  Neuro: Sedated on vent  CV: s1s2 regular rate and rhythm, no murmur, rubs, or gallops,  PULM:  Clear to ascultation, no increased work of breathing, no added breath sounds  GI: soft, bowel sounds active in all 4 quadrants, non-tender, non-distended, tolerating TF Extremities: warm/dry, no edema  Skin: no rashes or lesions  Resolved Hospital Problem list     Assessment & Plan:  V. Fib arrest, left heart cath without significant coronary artery disease -Suspicion pt may have developed mild Angioedema due to home Lisinopril leading to airway obstruction with respiratory arrest Acute HFrEF  -LVEF <20%, LV with severely decreased function, Grade II diastolic dysfunction, global hypokinesis, trivial mitral regurgitation,  -Echocardiogram 03/23/21 LVEF <20% with global hypokinesis, Grade II diastolic dysfunction, RV function normal.  Hx of hypertension P: Cardiology saw at [facility]  SBP goal < 170 Continue Coreg hydralazine, and Imdur  Continuous telemetry  Strict intake and output  Daily weight to assess volume status Daily assessment for need to diurese with the use of IV lasix   Closely monitor renal function and electrolytes    Acute Metabolic Encephalopathy post cardiac arrest Anoxic Brain Injury Myoclonic  Seizures -Completed TTM protocol. MRI brain consistent with hypoxic ischemic injury involving bilateral ACA-MCA watershed cortex, bilateral occipital cortex and basal ganglia.  -1/25 CTH: concerning for progression of anoxic injury. EEG with myoclonic sz activity.  P: Management per neurology  Maintain neuro protective measures; goal for eurothermia, euglycemia, eunatermia, normoxia, and PCO2 goal of 35-40 Nutrition and bowel regiment  Seizure precautions  AEDs per neurology  Aspirations precautions  EEG per neuro    Acute Hypoxic Respiratory Failure in the setting of cardiac arrest & Anoxic Brain Injury P: Continue ventilator support with lung protective strategies  Wean PEEP and FiO2 for sats greater than 90%. Head of bed elevated 30 degrees. Plateau pressures less than 30 cm H20.  Follow intermittent chest x-ray and ABG.   SAT/SBT as tolerated, mentation preclude extubation  Ensure adequate pulmonary hygiene  Follow cultures  VAP bundle in place  PAD protocol  HTN Goal < 123XX123 systolic P: Continue oral hydralazine and Coreg  PRN IV labetalol    Acute Kidney Injury Hypernatremia P: Follow renal function  Monitor urine output Trend Bmet Avoid nephrotoxins Ensure adequate renal perfusion  Oral hydration    Leukocytosis, suspect due to Aspiration Pneumonia CXR 1/26>> Improving perihilar atelectasis. -S/P 5 day course of ceftriaxone P: Continue to trend cbc and fever curve   Nutrition P: Continue TF   Hyperglycemia P: Continue SSI  CBG goal 140-180  Best Practice (right click and "Reselect all SmartList Selections" daily)   Diet/type: tubefeeds DVT prophylaxis: Lovenox GI prophylaxis: PPI Lines: N/A Foley:  Yes, and it is still needed Code Status:  full code Last date of multidisciplinary goals of care discussion [1/26]   Critical care time:    Performed by: Eugune Sine D. Harris  Total critical care time: 40 minutes  Critical care time was exclusive  of separately billable procedures and treating other patients.  Critical care was necessary to treat or prevent imminent or life-threatening deterioration.  Critical care was time spent personally by me on the following activities: development of treatment plan with patient and/or surrogate as well as nursing, discussions with consultants, evaluation of patient's response to treatment, examination of patient, obtaining history from patient or surrogate, ordering and performing treatments and interventions, ordering and review of laboratory studies, ordering and review of radiographic studies, pulse oximetry and re-evaluation of patient's condition.  Piya Mesch D. Kenton Kingfisher, NP-C [facility] Personal contact information can be found on Amion  04/02/2021, 9:30 AM

## 2021-04-03 DIAGNOSIS — G931 Anoxic brain damage, not elsewhere classified: Secondary | ICD-10-CM | POA: Diagnosis present

## 2021-04-03 DIAGNOSIS — J9601 Acute respiratory failure with hypoxia: Secondary | ICD-10-CM | POA: Diagnosis not present

## 2021-04-03 DIAGNOSIS — T17908D Unspecified foreign body in respiratory tract, part unspecified causing other injury, subsequent encounter: Secondary | ICD-10-CM | POA: Diagnosis not present

## 2021-04-03 DIAGNOSIS — I469 Cardiac arrest, cause unspecified: Secondary | ICD-10-CM | POA: Diagnosis not present

## 2021-04-03 LAB — GLUCOSE, CAPILLARY
Glucose-Capillary: 129 mg/dL — ABNORMAL HIGH (ref 70–99)
Glucose-Capillary: 130 mg/dL — ABNORMAL HIGH (ref 70–99)
Glucose-Capillary: 135 mg/dL — ABNORMAL HIGH (ref 70–99)
Glucose-Capillary: 137 mg/dL — ABNORMAL HIGH (ref 70–99)
Glucose-Capillary: 142 mg/dL — ABNORMAL HIGH (ref 70–99)
Glucose-Capillary: 171 mg/dL — ABNORMAL HIGH (ref 70–99)

## 2021-04-03 MED ORDER — NUTRISOURCE FIBER PO PACK
1.0000 | PACK | Freq: Two times a day (BID) | ORAL | Status: DC
  Administered 2021-04-03 – 2021-09-01 (×299): 1
  Filled 2021-04-03 (×307): qty 1

## 2021-04-03 MED ORDER — LEVETIRACETAM 100 MG/ML PO SOLN
750.0000 mg | Freq: Two times a day (BID) | ORAL | Status: DC
  Administered 2021-04-03 – 2021-05-12 (×78): 750 mg
  Filled 2021-04-03 (×81): qty 10

## 2021-04-03 NOTE — Progress Notes (Addendum)
Subjective: Patient continues to have intermittent myoclonic seizures, more prominent when stimulated.  ROS: Unable to obtain due to poor mental status  Examination  Vital signs in last 24 hours: Temp:  [97.3 F (36.3 C)-100.7 F (38.2 C)] 98.8 F (37.1 C) (01/30 1500) Pulse Rate:  [74-95] 90 (01/30 1531) Resp:  [17-24] 21 (01/30 1531) BP: (117-167)/(65-121) 145/81 (01/30 1531) SpO2:  [97 %-100 %] 99 % (01/30 1531) FiO2 (%):  [30 %] 30 % (01/30 1531) Weight:  [105.3 kg] 105.3 kg (01/30 0423)  General: lying in bed, NAD CVS: pulse-normal rate and rhythm RS: Intubated, symmetric air entry bilaterally Extremities: Warm, no edema Neuro: Comatose, does not open eyes to noxious stimulation, PERLA, roving eye movements, corneal reflex intact, gag reflex intact, does not withdraw to noxious stimuli in all 4 extremities  Basic Metabolic Panel: Recent Labs  Lab 03/28/21 0538 03/29/21 0615 03/30/21 0428 03/31/21 0334 04/01/21 0237 04/02/21 0233  NA 151* 150* 148* 148* 150* 148*  K 4.2 3.8 3.7 4.4 4.1 4.0  CL 116* 115* 111 111 114* 114*  CO2 26 26 26 27 25 25   GLUCOSE 125* 144* 142* 129* 151* 139*  BUN 78* 60* 54* 55* 56* 56*  CREATININE 1.90* 1.68* 1.76* 1.71* 1.85* 1.78*  CALCIUM 9.0 9.2 9.1 9.1 9.0 8.6*  MG 2.8* 2.4 2.4 2.3 2.6* 2.5*  PHOS 4.0 3.9 4.2 5.0*  --  4.7*    CBC: Recent Labs  Lab 03/29/21 0615 03/30/21 0428 03/31/21 0334 04/01/21 0237 04/02/21 0233  WBC 13.6* 13.4* 15.2* 13.9* 15.4*  NEUTROABS  --   --   --  10.2*  --   HGB 12.5* 12.7* 12.8* 12.6* 12.0*  HCT 39.9 40.0 39.8 39.1 38.6*  MCV 95.5 94.8 94.8 94.4 97.0  PLT 254 277 303 253 230     Coagulation Studies: No results for input(s): LABPROT, INR in the last 72 hours.  Imaging No new brain imaging overnight  ASSESSMENT AND PLAN: 51 year old lady status post cardiac arrest on 03/22/2021.   Cardiac arrest Anoxic/hypoxic brain injury Acute anoxic encephalopathy Myoclonic seizures -LTM EEG  continues to show myoclonic seizures and stimulus induced.  He discharges as well as encephalopathy.  This EEG pattern is consistent with anoxic/hypoxic brain injury   Recommendations -Continue Depakote 750 mg 3 times daily, continue Keppra 750 mg twice daily, continue clonazepam 0.5 mg 3 times daily.  We will check ammonia as it was elevated in the past and we have since increased Depakote -I have had an extensive discussion with wife again today.  Patient has suffered significant neurologic injury with minimal to no chances of meaningful brain recovery.  Wife understands and would like to proceed with trach and PEG followed by LTAC even if he continues to remain in a minimally responsive state. -In regards to myoclonic seizures, treating those would not improve patient's outcome.  Therefore, I would recommend continuing current AEDs for now.  If patient has increased frequency of myoclonic seizures, Klonopin can be increased -Management of rest of comorbidities per primary team  ADDENDUM -Patient's mother called me on my cell phone in the evening.  She states patient's wife shared the number with her and she was hoping to discuss patient's prognosis.  I discussed that I suspect Mr. 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  She also told me that she was a CNA and has seen patients with similar conditions.  She understands that her son  may not improve.  She thanked me for talking to her and states that she will talk to patient's wife  Thank you for allowing [place] to participate in the care of this patient.  Neurology will sign off.  Please call us for any further questions.   I have spent a total of  50 minutes with the patient reviewing hospital notes,  test results, labs and examining the patient as well as establishing an assessment and plan that was discussed personally with the patient's wife, ICU and palliative team.  > 50% of time  was spent in direct patient care.    Lindie Spruce Epilepsy [facility] For questions after 5pm please refer to AMION to reach the Neurologist on call

## 2021-04-03 NOTE — Progress Notes (Signed)
LTM EEG discontinued - no skin breakdown at unhook.   

## 2021-04-03 NOTE — Progress Notes (Signed)
NAME:  Billy Cherry, MRN:  [id number], DOB:  15-Mar-1970, LOS: 4 ADMISSION DATE:  03/30/2021, CONSULTATION DATE:  1/19 REFERRING MD:  Lorinda Creed, CHIEF COMPLAINT:  Found down, cardiac arrest   Brief Pt Description / Synopsis:  51 year old male with out-of-hospital V. fib cardiac arrest in setting of respiratory arrest due to suspected angioedema from ACE inhibitor leading to asphyxiation.  Concern on his 12 lead EKG for anterolateral ST elevation so he was taken to the cath lab where his study showed no significant coronary artery disease and an LVEF 45-50%  Now with concern for anoxic brain injury.  He remains critically ill.   Pertinent  Medical History  Hypertension  Micro Data:  1/19 SARS CoV2/Flu > negative 1/19: MRSA PCR>> negative 1/21: Tracheal aspirate>> normal respiratory flora 1/21: Blood culture x2>> negztive  Antimicrobials:  Ceftriaxone 1/21>>1/25  Significant Hospital Events: Including procedures, antibiotic start and stop dates in addition to other pertinent events   1/19 admission, left heart cath without significant CAD.  Concern for anoxic brain injury 1/20: MRI pending, Neuro following 1/21: MRI shows areas of hypoxic-ischemic injury bilaterally. Fevered; cultures obtained. 1/23 severe brain damage 1/24 evidence of severe brain damage, remains critically ill 1/25: Plan for PEG today. Neuro exam unchanged.  Does exhibit new eyelid tremor like movements and lip/jaw tremor like vs clonus movement.  Bolused Keppra and increased maintenance dose.  Repeat EEG. CTH: concerning for progression of anoxic injury.  1/26: Repeat EEG unchanged and continues to show myoclonic seizures despite addition of Valproic acid yesterday. transfer to [facility] for continuous EEG 1/28: Continues to have myoclonic sz on EEG 1/29 No acute issues overnight  Interim History / Subjective:  Remains on cEEG  No events overnight Tmax 100.7 Small amount of bright red blood ETT secretions  overnight  Objective   Blood pressure (!) 156/104, pulse 85, temperature 100.3 F (37.9 C), temperature source Axillary, resp. rate 18, weight 105.3 kg, SpO2 100 %.    Vent Mode: PRVC FiO2 (%):  [30 %] 30 % Set Rate:  [20 bmp] 20 bmp Vt Set:  [500 mL] 500 mL PEEP:  [5 cmH20] 5 cmH20 Plateau Pressure:  [13 cmH20-17 cmH20] 16 cmH20   Intake/Output Summary (Last 24 hours) at 04/03/2021 0731 Last data filed at 04/03/2021 0600 Gross per 24 hour  Intake 3216 ml  Output 2325 ml  Net 891 ml    Filed Weights   04/01/21 0500 04/02/21 0334 04/03/21 0423  Weight: 104.2 kg 104.6 kg 105.3 kg   Examination:  General:  adult male intubated on MV in NAD HEENT: MM pink/moist, ETT, left nare cortrak, pupils 4/reactive, anicteric, intact corneals  Neuro:  frequent spontaneous extensor posturing in all extremities, no response to noxious stimuli or change in posturing, no myoclonus noted  CV: rr, some PVCs, +2 pulses  PULM:  non labored, CTA, not able to elicit cough, no secretions on my exam GI: soft, bsx4 active, external catheter, flexiseal  Extremities: warm/dry, trace pedal edema  Skin: no rashes   UOP 2L/ 24hrs, net +2.4L  No labs 1/30 Tmax 100.7  Resolved Hospital Problem list     Assessment & Plan:  V. Fib arrest, left heart cath without significant coronary artery disease -Suspicion pt may have developed mild Angioedema due to home Lisinopril leading to airway obstruction with respiratory arrest Acute HFrEF  -LVEF <20%, LV with severely decreased function, Grade II diastolic dysfunction, global hypokinesis, trivial mitral regurgitation,  -Echocardiogram 03/23/21 LVEF <20% with global hypokinesis, Grade II  diastolic dysfunction, RV function normal.  - Cardiology saw at [facility]  Hx of hypertension P: SBP goal < 170 Monitor I/O, daily wts Hold on diuresis 1/30 Keep K > 4, Mag > 2 Closely monitor renal function and electrolytes    Acute Metabolic Encephalopathy post cardiac  arrest Anoxic Brain Injury Myoclonic Seizures -Completed TTM protocol. MRI brain consistent with hypoxic ischemic injury involving bilateral ACA-MCA watershed cortex, bilateral occipital cortex and basal ganglia.  -1/25 CTH: concerning for progression of anoxic injury. EEG with myoclonic sz activity.  P: - Per Neurology - cEEG stopped 1/30- ongoing myoclonus  - AED per neurology, continue VPA, keppra, and klonopin  - Maintain neuro protective measures; goal for eurothermia, euglycemia, eunatermia, normoxia, and PCO2 goal of 35-40 - Nutrition and bowel regiment, adding fiber  - Seizure precautions     Acute Hypoxic Respiratory Failure in the setting of cardiac arrest & Anoxic Brain Injury P: - Continue MV support, 4-8cc/kg IBW with goal Pplat <30 and DP<15  - no further bloody endotracheal secretions today, monitor  - VAP prevention protocol/ PPI - PAD protocol for sedation> prn fentanyl  - wean FiO2 as able for SpO2 >92%  - daily SAT & SBT- weaning today but mental status is barrier to extubation - Day 11 of ETT.   Will likely require trach and peg if wife wants to continue ongoing care.  Will discuss with wife.   HTN Goal < 123XX123 systolic P: Continue oral hydralazine, Coreg, and imdur PRN IV hydralazine and labetalol    Acute Kidney Injury Hypernatremia P: - bmet, mag, phos in am - continue FWF - Trend BMP / urinary output - Replace electrolytes as indicated - Avoid nephrotoxic agents, ensure adequate renal perfusion    Leukocytosis, suspect due to Aspiration Pneumonia CXR 1/26>> Improving perihilar atelectasis. -S/P 5 day course of ceftriaxone P: - monitor clinically off abx.  Trend WBC/ fever curve   Nutrition P: Continue TF   Hyperglycemia P: Continue SSI moderate  CBG goal 140-180  Best Practice (right click and "Reselect all SmartList Selections" daily)   Diet/type: tubefeeds DVT prophylaxis: heparin SQ GI prophylaxis: PPI Lines: N/A Foley:  Yes, and  it is still needed Code Status:  full code Last date of multidisciplinary goals of care discussion [1/26]- PMT following for ongoing [place].  Called and spoke with [place], wife by phone.  Updated her on no change in condition.  Started to recap [place] with her and she did not want to have these discussions, as she is working.   She stated she will be available Thursday to have [place] discussions, otherwise she would like daily updates.  She voices frustration that we started discussions so early and feels like she is being rushed.  I reassured her that this is not our intentions, that we just want to honor his wishes and start discussions early to allow family time to process and have time to decide on long term goals of care.    Critical care time:  30 mins     Kennieth Rad, ACNP [facility] 04/03/2021, 7:32 AM

## 2021-04-03 NOTE — Assessment & Plan Note (Signed)
Now suppressed with valproate, levetiracetam, and clonazepam.  Plan: - continue current medications.

## 2021-04-03 NOTE — Assessment & Plan Note (Addendum)
Creatinine improved to 1.53

## 2021-04-03 NOTE — Progress Notes (Signed)
EEG maintenance performed.  No skin breakdown observed at electrode sites Fp2, Fp1, T7, P7, C3.

## 2021-04-03 NOTE — Procedures (Addendum)
Patient Name: Billy Cherry  MRN: [id number]  Epilepsy Attending: Lora Havens  Referring Physician/Provider: Dr Amie Portland Duration: 04/02/2021 1719 to 04/03/2021 1050   Patient history: 51 year old male status post cardiac arrest now noted to have eye twitching/remor like movements and rhythmic lip & tongue fasciculations.  EEG evaluate for seizure.   Level of alertness: comatose   AEDs during EEG study: LEV, VPA, clonazepam   Technical aspects: This EEG study was done with scalp electrodes positioned according to the 10-20 International system of electrode placement. Electrical activity was acquired at a sampling rate of 500Hz  and reviewed with a high frequency filter of 70Hz  and a low frequency filter of 1Hz . EEG data were recorded continuously and digitally stored.    Description: EEG showed continuous generalized 5 to 7 Hz theta slowing. Generalized periodic discharges were also noted at 1.5 to 2 Hz predominantly when patient was awake/stimulated.  This EEG is consistent with stimulation induced periodic discharges.   Patient was noted to have episodes of eye opening during the study, more prominent when stimulated. Concomitant EEG showed generalized spikes consistent with myoclonic seizure.    Hyperventilation and photic stimulation were not performed.      ABNORMALITY -Myoclonic seizure, generalized -Stimulation induced periodic discharges, generalized -Continuous slow, generalized   IMPRESSION: This study showed episodes of eye opening consistent with myoclonic seizures.  Additionally EEG suggestive of epileptogenicity with generalized onset and high potential for seizure recurrence.  Lastly, EEG was suggestive of severe diffuse encephalopathy.  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   EEG appear to be similar to previous day.    Reita Shindler Barbra Sarks

## 2021-04-03 NOTE — Assessment & Plan Note (Signed)
Patient remains in a minimally responsive state at > 7 days post arrest.   MRI shows changes consistent with diffuse anoxic injury.   Plan: - Trach/PEG and long-term placement per wife's wishes after several long conversation with CCM, neurology and palliative care regarding long-term prognosis which is likely to be a persistent minimally responsive state.

## 2021-04-03 NOTE — Assessment & Plan Note (Signed)
Likely due to asphyxiation from ACEi angioedema but also STEACS with EF 45% but no flow obstructing lesion. On exam on no vasoactive agents.  No JVD and normal HS.  Plan:  - Continue current carvedilol, hydralazine, Isordil  - ASA and atrovastatin

## 2021-04-03 NOTE — Assessment & Plan Note (Addendum)
Tolerating SBT.  Plan: - Continue daily SBT.  - Will need tracheostomy.  - Family discussion to formally obtain consent for tracheostomy and PEG with Palliative care tomorrow.

## 2021-04-04 DIAGNOSIS — E870 Hyperosmolality and hypernatremia: Secondary | ICD-10-CM | POA: Diagnosis not present

## 2021-04-04 DIAGNOSIS — T17908D Unspecified foreign body in respiratory tract, part unspecified causing other injury, subsequent encounter: Secondary | ICD-10-CM | POA: Diagnosis not present

## 2021-04-04 DIAGNOSIS — N179 Acute kidney failure, unspecified: Secondary | ICD-10-CM | POA: Diagnosis not present

## 2021-04-04 LAB — CBC
HCT: 39.2 % (ref 39.0–52.0)
Hemoglobin: 12.7 g/dL — ABNORMAL LOW (ref 13.0–17.0)
MCH: 31.1 pg (ref 26.0–34.0)
MCHC: 32.4 g/dL (ref 30.0–36.0)
MCV: 95.8 fL (ref 80.0–100.0)
Platelets: 201 10*3/uL (ref 150–400)
RBC: 4.09 MIL/uL — ABNORMAL LOW (ref 4.22–5.81)
RDW: 14 % (ref 11.5–15.5)
WBC: 23.2 10*3/uL — ABNORMAL HIGH (ref 4.0–10.5)
nRBC: 0 % (ref 0.0–0.2)

## 2021-04-04 LAB — RENAL FUNCTION PANEL
Albumin: 2.5 g/dL — ABNORMAL LOW (ref 3.5–5.0)
Anion gap: 10 (ref 5–15)
BUN: 45 mg/dL — ABNORMAL HIGH (ref 6–20)
CO2: 26 mmol/L (ref 22–32)
Calcium: 9 mg/dL (ref 8.9–10.3)
Chloride: 114 mmol/L — ABNORMAL HIGH (ref 98–111)
Creatinine, Ser: 1.53 mg/dL — ABNORMAL HIGH (ref 0.61–1.24)
GFR, Estimated: 55 mL/min — ABNORMAL LOW (ref >60)
Glucose, Bld: 143 mg/dL — ABNORMAL HIGH (ref 70–99)
Phosphorus: 3.7 mg/dL (ref 2.5–4.6)
Potassium: 4.2 mmol/L (ref 3.5–5.1)
Sodium: 150 mmol/L — ABNORMAL HIGH (ref 135–145)

## 2021-04-04 LAB — GLUCOSE, CAPILLARY
Glucose-Capillary: 122 mg/dL — ABNORMAL HIGH (ref 70–99)
Glucose-Capillary: 130 mg/dL — ABNORMAL HIGH (ref 70–99)
Glucose-Capillary: 169 mg/dL — ABNORMAL HIGH (ref 70–99)
Glucose-Capillary: 145 mg/dL — ABNORMAL HIGH (ref 70–99)
Glucose-Capillary: 136 mg/dL — ABNORMAL HIGH (ref 70–99)
Glucose-Capillary: 156 mg/dL — ABNORMAL HIGH (ref 70–99)

## 2021-04-04 LAB — MAGNESIUM: Magnesium: 2.4 mg/dL (ref 1.7–2.4)

## 2021-04-04 LAB — AMMONIA: Ammonia: 38 umol/L — ABNORMAL HIGH (ref 9–35)

## 2021-04-04 SURGERY — CREATION, TRACHEOSTOMY
Anesthesia: General

## 2021-04-04 MED ORDER — FREE WATER
200.0000 mL | Status: DC
  Administered 2021-04-04 – 2021-04-09 (×60): 200 mL

## 2021-04-04 MED ORDER — SODIUM CHLORIDE 0.9 % IV SOLN
2.0000 g | Freq: Three times a day (TID) | INTRAVENOUS | Status: DC
  Administered 2021-04-04 – 2021-04-06 (×7): 2 g via INTRAVENOUS
  Filled 2021-04-04 (×7): qty 2

## 2021-04-04 MED ORDER — FUROSEMIDE 10 MG/ML IJ SOLN
40.0000 mg | Freq: Once | INTRAMUSCULAR | Status: AC
  Administered 2021-04-04: 40 mg via INTRAVENOUS
  Filled 2021-04-04: qty 4

## 2021-04-04 NOTE — Progress Notes (Signed)
Pharmacy Antibiotic Note  Billy Cherry is a 77 YOM originally admitted at [facility] on 03/23/21 for an out-of-hospital cardiac arrest secondary to suspected ACEi-induced angioedema, leading to hypoxic respiratory failure and anoxic brain injury. Transferred to [facility] for cEEG monitoring. Now s/p 5 day treatment for aspiration pneumonia with Ceftriaxone 1/21 - 1/26. CXR from 1/26 showed improving perihilar atelectasis.   WBC 13.9 >> 15.4 >> 23.2; Tmax 101.7 this AM  Billy Cherry has also had increased malodorous, pinkish-tan secretions overnight. Pharmacy has been consulted for Cefepime dosing for suspected aspiration pneumonia.  Plan: -Start Cefepime 2g IV q8h -F/u CBC, fever curve, culture  Weight: 105.3 kg (232 lb 2.3 oz)  Temp (24hrs), Avg:99.3 F (37.4 C), Min:97.3 F (36.3 C), Max:101.7 F (38.7 C)  Recent Labs  Lab 03/30/21 0428 03/31/21 0334 04/01/21 0237 04/02/21 0233 04/04/21 0424  WBC 13.4* 15.2* 13.9* 15.4* 23.2*  CREATININE 1.76* 1.71* 1.85* 1.78* 1.53*    Estimated Creatinine Clearance: 71.3 mL/min (A) (by C-G formula based on SCr of 1.53 mg/dL (H)).    Allergies  Allergen Reactions   Lisinopril Swelling    angioedema    Antimicrobials this admission: Ceftriaxone 1/21 >> 1/26  Microbiology results: 1/31 Tracheal Aspirate: pending  Thank you for allowing pharmacy to be a part of this patients care.  Billy Cherry Pharmacy Student 04/04/2021 8:47 AM

## 2021-04-04 NOTE — Progress Notes (Addendum)
Palliative Medicine Inpatient Follow Up Note  HPI:  Per progress note(s)-->  51 year old male with out-of-hospital V. fib cardiac arrest in setting of respiratory arrest due to suspected angioedema from ACE inhibitor leading to asphyxiation. Cardiac catheterization without evidence of significant coronary artery disease. Now with concern for anoxic brain injury.   Palliative care has been asked to get involved to discuss goals of care in the setting of a cardiac arrest.  Today's Discussion (04/04/2021):  *Please note that this is a verbal dictation therefore any spelling or grammatical errors are due to the "Dragon Medical One" system interpretation.  Chart reviewed inclusive of vital signs, progress notes, laboratory results, and diagnostic images.   I called patients spouse, Billy Cherry this morning. I shared with her briefly my concerns regarding Billy Cherry, his anoxic brain injury and ongoing seizures. I shared the importance of having a meeting sooner than later as I worry doing things such as a tracheostomy and gastrostomy tube will only prolong the inevitable.  Billy Cherry "Billy Cherry" shares that she is at work. She asked me how Billy Cherry is doing today specifically. I asked Billy Cherry if I may be very blunt and honest though warned her this may cause her to become upset. She stated that she wants to hear what I have to say though not while she is at work right now. She asked that I call her mother in law, Billy Cherry's mother, Billy Cherry and speak to her. Billy Cherry does confirm that she will meet with the Palliative and Medicine Team on Thursday at 1PM.  I called Billy Cherry mother, Billy Cherry. I introduced myself, my role, and asked her what she has been told about her sons condition. Billy Cherry expresses that she knows her son has suffered severe injury. She expresses that she lost two brothers, another son, and a nephew recently all due to "heart problems". She also shares that Billy Cherry father had "heart problems". I asked if  they have ever had genetic testing for this which she stated the men in the family had not from what she knows.   I shared that it appears Billy Cherry had a reaction to a medication causing a respiratory then cardiac arrest. Because of this he has severe brain injury due to not getting enough oxygen through his blood. 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 I shared very honestly that the Billy Cherry she knew and loved is gone and we are left now with his shell. Billy Cherry shares "I knew when I looked into his eyes that he was gone." She goes on to tell me that she is a realist and knows even with waiting two additional weeks and further interventions that her son is not coming back.   Billy Cherry is willing to be on facetime with the meeting Thursday to support Billy Cherry. I told her that we are going to need to discuss some tough topics and I fully anticipate it being hard for Billy Cherry. Billy Cherry lives in [place] so cannot be here presently but would like to be a support over the phone.   Questions and concerns addressed   Palliative Support Provided.   Objective Assessment: Vital Signs Vitals:   04/04/21 1209 04/04/21 1210  BP: 123/84   Pulse: 94   Resp: (!) 26   Temp:    SpO2: 100% 99%    Intake/Output Summary (Last 24 hours) at 04/04/2021 1331 Last data filed at 04/04/2021 1200  Gross per 24 hour  Intake 3165.07 ml  Output 2795 ml  Net 370.07 ml    Last Weight  Most recent update: 04/03/2021  4:23 AM    Weight  105.3 kg (232 lb 2.3 oz)            Gen:  Middle aged AA M intubated HEENT: Intubated, coretrack, drymucous membranes CV: Regular rate and rhythm  PULM: Mechanical ventilator ABD: soft/nontender  EXT: No edema  Neuro: Somnolent, not following any commands  SUMMARY OF RECOMMENDATIONS   Full Code / Full Scope of Care   Plan for meeting on Thursday at 1300 it will be important for  patients mother, Billy Cherry to be included on FaceTime to act as a support for Billy Cherry  Topics to be discussed will be those of tracheostomy, PEG, and quality of life if these measures are pursued   Palliative support will continue during [place] hospitalization. I will not be present for the meeting Thursday though my colleague, Lorinda Creed will attend.  Total Time: 8  MDM - 4  Medical Decision Making:4 #/Complex Problems: 4                     Data Reviewed:    4             Management: 4 (1-Straightforward, 2-Low, 3-Moderate, 4-High) ______________________________________________________________________________________ Lamarr Lulas [place] Palliative Medicine Team Team Cell Phone: [phone] Please utilize secure chat with additional questions, if there is no response within 30 minutes please call the above phone number  Palliative Medicine Team providers are available by phone from 7am to 7pm daily and can be reached through the team cell phone.  Should this patient require assistance outside of these hours, please call the patient's attending physician.

## 2021-04-04 NOTE — Assessment & Plan Note (Signed)
Na: 150  Plan: - increase free water.

## 2021-04-04 NOTE — Progress Notes (Signed)
NAME:  Billy Cherry, MRN:  [id number], DOB:  15-May-1970, LOS: 5 ADMISSION DATE:  03/30/2021, CONSULTATION DATE:  1/19 REFERRING MD:  Lorinda Creed, CHIEF COMPLAINT:  Found down, cardiac arrest   Brief Pt Description / Synopsis:  51 year old male with out-of-hospital V. fib cardiac arrest in setting of respiratory arrest due to suspected angioedema from ACE inhibitor leading to asphyxiation.  Concern on his 12 lead EKG for anterolateral ST elevation so he was taken to the cath lab where his study showed no significant coronary artery disease and an LVEF 45-50%  Now with concern for anoxic brain injury.  He remains critically ill.   Pertinent  Medical History  Hypertension  Micro Data:  1/19 SARS CoV2/Flu > negative 1/19: MRSA PCR>> negative 1/21: Tracheal aspirate>> normal respiratory flora 1/21: Blood culture x2>> negztive  Antimicrobials:  Ceftriaxone 1/21>>1/25  Significant Hospital Events: Including procedures, antibiotic start and stop dates in addition to other pertinent events   1/19 admission, left heart cath without significant CAD.  Concern for anoxic brain injury 1/20: MRI pending, Neuro following 1/21: MRI shows areas of hypoxic-ischemic injury bilaterally. Fevered; cultures obtained. 1/23 severe brain damage 1/24 evidence of severe brain damage, remains critically ill 1/25: Plan for PEG today. Neuro exam unchanged.  Does exhibit new eyelid tremor like movements and lip/jaw tremor like vs clonus movement.  Bolused Keppra and increased maintenance dose.  Repeat EEG. CTH: concerning for progression of anoxic injury.  1/26: Repeat EEG unchanged and continues to show myoclonic seizures despite addition of Valproic acid yesterday. transfer to [facility] for continuous EEG 1/28: Continues to have myoclonic sz on EEG 1/29 No acute issues overnight 1/30 cEEG stopped, tmax 100.7  Interim History / Subjective:  No events overnight Tmax 100.4  Objective   Blood pressure (!)  154/102, pulse 99, temperature (!) 101.7 F (38.7 C), temperature source Axillary, resp. rate (!) 22, weight 105.3 kg, SpO2 100 %.    Vent Mode: PRVC FiO2 (%):  [30 %] 30 % Set Rate:  [20 bmp] 20 bmp Vt Set:  [500 mL] 500 mL PEEP:  [5 cmH20] 5 cmH20 Pressure Support:  [5 cmH20] 5 cmH20 Plateau Pressure:  [13 cmH20-15 cmH20] 13 cmH20   Intake/Output Summary (Last 24 hours) at 04/04/2021 [id number] Last data filed at 04/04/2021 0800 Gross per 24 hour  Intake 3223 ml  Output 1970 ml  Net 1253 ml    Filed Weights   04/01/21 0500 04/02/21 0334 04/03/21 0423  Weight: 104.2 kg 104.6 kg 105.3 kg   Examination:  General:  critically ill adult male on MV in NAD HEENT: MM pink/moist, ETT, left nare cortrak, pupils 3/reactive Neuro:  no myoclonus seen today, unresponsive to noxious stimuli except for grimacing with mouth care CV: rr, no murmur PULM:  MV supported breaths, thick pinkish tan secretions with foul smell, rhonchi that improves with suctioning GI: soft, bs+, condom cath, flexiseal Extremities: warm/dry, generalized +1 edema, +2 pedal Skin: no rashes   UOP 1.8L/ 24hrs, net +3.1L  Tmax 100.4  Labs:  Na 150, K 4.2, sCr 1.78> 1.53, WBC 15.6 > 23, ammonia 38  Resolved Hospital Problem list     Assessment & Plan:  V. Fib arrest, left heart cath without significant coronary artery disease -Suspicion pt may have developed mild Angioedema due to home Lisinopril leading to airway obstruction with respiratory arrest Acute HFrEF  -LVEF <20%, LV with severely decreased function, Grade II diastolic dysfunction, global hypokinesis, trivial mitral regurgitation,  -Echocardiogram 03/23/21 LVEF <20% with global  hypokinesis, Grade II diastolic dysfunction, RV function normal.  - Cardiology saw at [facility]  Hx of hypertension P: - Continue supportive care  - Monitor I/O, daily wts - lasix 40mg  x 1 - Keep K > 4, Mag > 2 - trend renal function and electrolytes    Acute Metabolic Encephalopathy  post cardiac arrest Anoxic Brain Injury Myoclonic Seizures -Completed TTM protocol. MRI brain consistent with hypoxic ischemic injury involving bilateral ACA-MCA watershed cortex, bilateral occipital cortex and basal ganglia.  -1/25 CTH: concerning for progression of anoxic injury. EEG with myoclonic sz activity.  - cEEG stopped 1/30 P: - Neurology s/o 1/30.  Recs to continue depakote 750mg  TID, Keprra 750mg  BID, and clonazepam 0.5mg  TID.  If increasing myoclonus, increase clonazepam  - minimal to no chances of meaningful recovery, ongoing GOC  - Maintain neuro protective measures; goal for eurothermia, euglycemia, eunatermia, normoxia, and PCO2 goal of 35-40 - Seizure precautions     Acute Hypoxic Respiratory Failure in the setting of cardiac arrest & Anoxic Brain Injury P: - full MV support - daily weaning trials, weaning day 2 PSV 5/5 however mental status remains barrier to extubation.  Day 12 ETT, will need trach if family wishes to continue care.   - VAP/ PPI  - PAD protocol- prn fentanyl as needed w/bowel regimen  - sending trach asp today given change in secretions    HTN Goal < 123XX123 systolic P: - Continue oral hydralazine, Coreg, and imdur with PRN IV hydralazine and labetalol    Acute Kidney Injury Hypernatremia P: - free water deficit 4200, increase FWF to 238ml q 2hrs - Trend BMP / urinary output - Replace electrolytes as indicated - Avoid nephrotoxic agents, ensure adequate renal perfusion    Leukocytosis, suspect due to Aspiration Pneumonia CXR 1/26>> Improving perihilar atelectasis. -S/P 5 day course of ceftriaxone P: - worsening secretions 1/31, now thick pinkish tan with odor and increasing leukoctyosis 15> 23k, tmax 100.4 - send trach asp - start cefepime, narrow per culture data   Nutrition P: - Continue TF with fiber   Hyperglycemia P: - Continue SSI moderate    Best Practice (right click and "Reselect all SmartList Selections" daily)    Diet/type: tubefeeds DVT prophylaxis: heparin SQ GI prophylaxis: PPI Lines: N/A Foley:  N/A Code Status:  full code Last date of multidisciplinary goals of care discussion [1/26]- PMT following for ongoing [place] with wife.  Minimal to no chances of meaningful recovery.    Critical care time:  30 mins     Kennieth Rad, ACNP [facility] 04/04/2021, 8:08 AM

## 2021-04-05 DIAGNOSIS — I5022 Chronic systolic (congestive) heart failure: Secondary | ICD-10-CM | POA: Diagnosis present

## 2021-04-05 DIAGNOSIS — I5020 Unspecified systolic (congestive) heart failure: Secondary | ICD-10-CM | POA: Diagnosis present

## 2021-04-05 DIAGNOSIS — T17908D Unspecified foreign body in respiratory tract, part unspecified causing other injury, subsequent encounter: Secondary | ICD-10-CM | POA: Diagnosis not present

## 2021-04-05 DIAGNOSIS — N179 Acute kidney failure, unspecified: Secondary | ICD-10-CM | POA: Diagnosis not present

## 2021-04-05 LAB — GLUCOSE, CAPILLARY
Glucose-Capillary: 135 mg/dL — ABNORMAL HIGH (ref 70–99)
Glucose-Capillary: 142 mg/dL — ABNORMAL HIGH (ref 70–99)
Glucose-Capillary: 147 mg/dL — ABNORMAL HIGH (ref 70–99)
Glucose-Capillary: 147 mg/dL — ABNORMAL HIGH (ref 70–99)
Glucose-Capillary: 158 mg/dL — ABNORMAL HIGH (ref 70–99)
Glucose-Capillary: 166 mg/dL — ABNORMAL HIGH (ref 70–99)
Glucose-Capillary: 169 mg/dL — ABNORMAL HIGH (ref 70–99)

## 2021-04-05 MED ORDER — LOPERAMIDE HCL 1 MG/7.5ML PO SUSP
2.0000 mg | ORAL | Status: DC | PRN
  Administered 2021-04-05 – 2021-09-23 (×8): 2 mg
  Filled 2021-04-05 (×14): qty 15

## 2021-04-05 NOTE — Progress Notes (Signed)
NAME:  Billy Cherry, MRN:  [id number], DOB:  06-May-1970, LOS: 6 ADMISSION DATE:  03/30/2021, CONSULTATION DATE:  1/19 REFERRING MD:  Billy Cherry, CHIEF COMPLAINT:  Found down, cardiac arrest   Brief Pt Description / Synopsis:  51 year old male with out-of-hospital V. fib cardiac arrest in setting of respiratory arrest due to suspected angioedema from ACE inhibitor leading to asphyxiation.  Concern on his 12 lead EKG for anterolateral ST elevation so he was taken to the cath lab where his study showed no significant coronary artery disease and an LVEF < 20%  Now withanoxic brain injury.  Pertinent  Medical History  Hypertension  Micro Data:  1/19 SARS CoV2/Flu > negative 1/19: MRSA PCR>> negative 1/21: Tracheal aspirate>> normal respiratory flora 1/21: Blood culture x2>> negztive  Antimicrobials:  Ceftriaxone 1/21>>1/25  Significant Hospital Events: Including procedures, antibiotic start and stop dates in addition to other pertinent events   1/19 admission, left heart cath without significant CAD.  Concern for anoxic brain injury 1/20: MRI pending, Neuro following 1/21: MRI shows areas of hypoxic-ischemic injury bilaterally. Fevered; cultures obtained. 1/23 severe brain damage 1/24 evidence of severe brain damage, remains critically ill 1/25: Plan for PEG today. Neuro exam unchanged.  Does exhibit new eyelid tremor like movements and lip/jaw tremor like vs clonus movement.  Bolused Keppra and increased maintenance dose.  Repeat EEG. CTH: concerning for progression of anoxic injury.  1/26: Repeat EEG unchanged and continues to show myoclonic seizures despite addition of Valproic acid yesterday. transfer to [facility] for continuous EEG 1/28: Continues to have myoclonic sz on EEG 1/29 No acute issues overnight  Interim History / Subjective:  Remains on cEEG  No events overnight Tmax 100.7 Small amount of bright red blood ETT secretions overnight  Objective   Blood pressure (!)  157/105, pulse 92, temperature 98.5 F (36.9 C), temperature source Axillary, resp. rate (!) 21, weight 105.3 kg, SpO2 96 %.    Vent Mode: PRVC FiO2 (%):  [30 %] 30 % Set Rate:  [20 bmp] 20 bmp Vt Set:  [500 mL-600 mL] 600 mL PEEP:  [5 cmH20] 5 cmH20 Pressure Support:  [5 cmH20] 5 cmH20 Plateau Pressure:  [16 cmH20-18 cmH20] 16 cmH20   Intake/Output Summary (Last 24 hours) at 04/05/2021 0745 Last data filed at 04/05/2021 0500 Gross per 24 hour  Intake 4430.07 ml  Output 2600 ml  Net 1830.07 ml     Filed Weights   04/01/21 0500 04/02/21 0334 04/03/21 0423  Weight: 104.2 kg 104.6 kg 105.3 kg   Examination:  General:  adult male intubated on MV in NAD HEENT: MM pink/moist, ETT, left nare cortrak, pupils 4/reactive, anicteric, intact corneals  Neuro:  frequent spontaneous extensor posturing in all extremities, no response to noxious stimuli or change in posturing, no myoclonus noted  CV: rr, some PVCs, +2 pulses  PULM:  non labored, CTA, not able to elicit cough, no secretions on my exam GI: soft, bsx4 active, external catheter, flexiseal  Extremities: warm/dry, trace pedal edema  Skin: no rashes   Ancillary tests personally reviewed:    No new labs  Assessment & Plan:  Principal Problem:   Cardiac arrest ([name]) Active Problems:   AKI (acute kidney injury) ([name])   Acute respiratory failure ([name])   Myoclonus   Anoxic encephalopathy ([name])   Hypernatremia   HFrEF (heart failure with reduced ejection fraction) ([name])  Plan:  - myoclonus now suppressed but patient remains minimally responsive - continue current therapy.  - no signs of decompensated  heart failure - continue current therapy - recheck sodium and adjust free water.  - creatinine continues to improve.  - ongoing palliative care discussions, wife continues to request full support including trach and PEG despite dismal long-term prognosis.   Best Practice (right click and "Reselect all SmartList Selections" daily)    Diet/type: tubefeeds -will add imodium to slow diarrhea. DVT prophylaxis: heparin SQ GI prophylaxis: PPI Lines: N/A Foley:  Yes, and it is still needed Code Status:  full code Last date of multidisciplinary goals of care discussion [1/26]- PMT following for ongoing [name].  Called and spoke with [place], wife by phone.  Updated her on no change in condition.  Started to recap [name] with her and she did not want to have these discussions, as she is working.   She stated she will be available Thursday to have [name] discussions, otherwise she would like daily updates.  She voices frustration that we started discussions so early and feels like she is being rushed.  I reassured her that this is not our intentions, that we just want to honor his wishes and start discussions early to allow family time to process and have time to decide on long term goals of care.   Kipp Brood, MD [name] ICU Physician [place]  Pager: [phone] Mobile: [phone] After hours: [phone].  04/05/2021, 7:45 AM

## 2021-04-06 DIAGNOSIS — T17908D Unspecified foreign body in respiratory tract, part unspecified causing other injury, subsequent encounter: Secondary | ICD-10-CM | POA: Diagnosis not present

## 2021-04-06 DIAGNOSIS — Z515 Encounter for palliative care: Secondary | ICD-10-CM

## 2021-04-06 LAB — CBC
HCT: 37.5 % — ABNORMAL LOW (ref 39.0–52.0)
Hemoglobin: 11.7 g/dL — ABNORMAL LOW (ref 13.0–17.0)
MCH: 29.7 pg (ref 26.0–34.0)
MCHC: 31.2 g/dL (ref 30.0–36.0)
MCV: 95.2 fL (ref 80.0–100.0)
Platelets: 205 10*3/uL (ref 150–400)
RBC: 3.94 MIL/uL — ABNORMAL LOW (ref 4.22–5.81)
RDW: 13.9 % (ref 11.5–15.5)
WBC: 21.4 10*3/uL — ABNORMAL HIGH (ref 4.0–10.5)
nRBC: 0 % (ref 0.0–0.2)

## 2021-04-06 LAB — RENAL FUNCTION PANEL
Albumin: 2.2 g/dL — ABNORMAL LOW (ref 3.5–5.0)
Anion gap: 12 (ref 5–15)
BUN: 54 mg/dL — ABNORMAL HIGH (ref 6–20)
CO2: 24 mmol/L (ref 22–32)
Calcium: 8.6 mg/dL — ABNORMAL LOW (ref 8.9–10.3)
Chloride: 110 mmol/L (ref 98–111)
Creatinine, Ser: 1.38 mg/dL — ABNORMAL HIGH (ref 0.61–1.24)
GFR, Estimated: >60 mL/min (ref >60)
Glucose, Bld: 139 mg/dL — ABNORMAL HIGH (ref 70–99)
Phosphorus: 3.7 mg/dL (ref 2.5–4.6)
Potassium: 4.2 mmol/L (ref 3.5–5.1)
Sodium: 146 mmol/L — ABNORMAL HIGH (ref 135–145)

## 2021-04-06 LAB — CULTURE, RESPIRATORY W GRAM STAIN

## 2021-04-06 LAB — GLUCOSE, CAPILLARY
Glucose-Capillary: 147 mg/dL — ABNORMAL HIGH (ref 70–99)
Glucose-Capillary: 147 mg/dL — ABNORMAL HIGH (ref 70–99)
Glucose-Capillary: 137 mg/dL — ABNORMAL HIGH (ref 70–99)
Glucose-Capillary: 129 mg/dL — ABNORMAL HIGH (ref 70–99)
Glucose-Capillary: 116 mg/dL — ABNORMAL HIGH (ref 70–99)
Glucose-Capillary: 120 mg/dL — ABNORMAL HIGH (ref 70–99)

## 2021-04-06 LAB — MAGNESIUM: Magnesium: 2.3 mg/dL (ref 1.7–2.4)

## 2021-04-06 MED ORDER — CEFAZOLIN SODIUM-DEXTROSE 2-4 GM/100ML-% IV SOLN
2.0000 g | Freq: Three times a day (TID) | INTRAVENOUS | Status: AC
  Administered 2021-04-06 – 2021-04-10 (×13): 2 g via INTRAVENOUS
  Filled 2021-04-06 (×13): qty 100

## 2021-04-06 NOTE — Progress Notes (Signed)
NAME:  Kawon Willcutt, MRN:  [id number], DOB:  1970/10/26, LOS: 7 ADMISSION DATE:  03/30/2021, CONSULTATION DATE:  1/19 REFERRING MD:  Evette Georges, CHIEF COMPLAINT:  Found down, cardiac arrest   Brief Pt Description / Synopsis:  51 year old male with out-of-hospital V. fib cardiac arrest in setting of respiratory arrest due to suspected angioedema from ACE inhibitor leading to asphyxiation.  Concern on his 12 lead EKG for anterolateral ST elevation so he was taken to the cath lab where his study showed no significant coronary artery disease and an LVEF < 20%  Now withanoxic brain injury.  Pertinent  Medical History  Hypertension  Micro Data:  1/19 SARS CoV2/Flu > negative 1/19: MRSA PCR>> negative 1/21: Tracheal aspirate>> normal respiratory flora 1/21: Blood culture x2>> negztive  Antimicrobials:  Ceftriaxone 1/21>>1/25  Significant Hospital Events: Including procedures, antibiotic start and stop dates in addition to other pertinent events   1/19 admission, left heart cath without significant CAD.  Concern for anoxic brain injury 1/20: MRI pending, Neuro following 1/21: MRI shows areas of hypoxic-ischemic injury bilaterally. Fevered; cultures obtained. 1/23 severe brain damage 1/24 evidence of severe brain damage, remains critically ill 1/25: Plan for PEG today. Neuro exam unchanged.  Does exhibit new eyelid tremor like movements and lip/jaw tremor like vs clonus movement.  Bolused Keppra and increased maintenance dose.  Repeat EEG. CTH: concerning for progression of anoxic injury.  1/26: Repeat EEG unchanged and continues to show myoclonic seizures despite addition of Valproic acid yesterday. transfer to [facility] for continuous EEG 1/28: Continues to have myoclonic sz on EEG 1/29 No acute issues overnight  Interim History / Subjective:  No change in neurological condition. Diarrhea has improved with imodium.   Objective   Blood pressure 124/78, pulse 90, temperature (!) 101.9 F  (38.8 C), temperature source Axillary, resp. rate (!) 23, weight 105.3 kg, SpO2 98 %.    Vent Mode: PSV;CPAP FiO2 (%):  [30 %] 30 % Set Rate:  [20 bmp] 20 bmp Vt Set:  [600 mL] 600 mL PEEP:  [5 cmH20] 5 cmH20 Pressure Support:  [5 cmH20-10 cmH20] 10 cmH20 Plateau Pressure:  [15 cmH20-18 cmH20] 18 cmH20   Intake/Output Summary (Last 24 hours) at 04/06/2021 1408 Last data filed at 04/06/2021 1300 Gross per 24 hour  Intake 4320.07 ml  Output 720 ml  Net 3600.07 ml     Filed Weights   04/01/21 0500 04/02/21 0334 04/03/21 0423  Weight: 104.2 kg 104.6 kg 105.3 kg   Examination:  General:  adult male intubated on MV in NAD HEENT: MM pink/moist, ETT, left nare cortrak, pupils 4/reactive, anicteric, intact corneals  Neuro:  frequent spontaneous extensor posturing in all extremities, no response to noxious stimuli or change in posturing, no myoclonus noted  CV: rr, some PVCs, +2 pulses  PULM:  non labored, CTA, not able to elicit cough, no secretions on my exam GI: soft, bsx4 active, external catheter, Extremities: warm/dry, trace pedal edema  Skin: no rashes   Ancillary tests personally reviewed:    No new labs  Assessment & Plan:  Principal Problem:   Cardiac arrest ([name]) Active Problems:   AKI (acute kidney injury) ([name])   Acute respiratory failure ([name])   Myoclonus   Anoxic encephalopathy ([name])   Hypernatremia   HFrEF (heart failure with reduced ejection fraction) ([name])  Plan:  - myoclonus now suppressed but patient remains minimally responsive - continue current therapy.  - no signs of decompensated heart failure - continue current therapy - recheck sodium  and adjust free water.  - creatinine continues to improve.  - ongoing palliative care discussions, wife continues to request full support including trach and PEG despite dismal long-term prognosis.  Palliative care discussion scheduled for today to discuss decision to move forward with tracheostomy.  Best Practice  (right click and "Reselect all SmartList Selections" daily)   Diet/type: tubefeeds -will add imodium to slow diarrhea. DVT prophylaxis: heparin SQ GI prophylaxis: PPI Lines: N/A Foley:  Yes, and it is still needed Code Status:  full code Last date of multidisciplinary goals of care discussion [1/26]- PMT following for ongoing GOC.  Team has previously called and spoke with [place], wife by phone.  Updated her on no change in condition.  Started to recap [place] with her and she did not want to have these discussions, as she is working.   She stated she will be available Thursday 2/2 to have [place] discussions, otherwise she would like daily updates.  She voices frustration that we started discussions so early and feels like she is being rushed.  I reassured her that this is not our intentions, that we just want to honor his wishes and start discussions early to allow family time to process and have time to decide on long term goals of care.   Lynnell Catalan, MD [facility] ICU Physician [facility] [place] [facility]  Pager: [phone] Mobile: [phone] After hours: [phone].  04/06/2021, 2:08 PM

## 2021-04-06 NOTE — Progress Notes (Signed)
Patient ID: Billy Cherry, male   DOB: 1970/06/16, 51 y.o.   MRN: [id number]    Progress Note from the Palliative Medicine Team at [facility]   Patient Name: Billy Cherry        Date: 04/06/2021 DOB: 1970-06-08  Age: 51 y.o. MRN#: [id number] Attending Physician: Kipp Brood, MD Primary Care Physician: Patient, No Pcp Per (Inactive) Admit Date: 03/30/2021   Medical records reviewed   51 year old male with out-of-hospital V. fib cardiac arrest in setting of respiratory arrest due to suspected angioedema from ACE inhibitor leading to asphyxiation.  Concern on his 12 lead EKG for anterolateral ST elevation so he was taken to the cath lab where his study showed no significant coronary artery disease and an LVEF < 20%  Now withanoxic brain injury.  1/19 admission, left heart cath without significant CAD.  Concern for anoxic brain injury 1/20: MRI pending, Neuro following 1/21: MRI shows areas of hypoxic-ischemic injury bilaterally. Fevered; cultures obtained. 1/23 severe brain damage 1/24 evidence of severe brain damage, remains critically ill 1/25: Plan for PEG today. Neuro exam unchanged.  Does exhibit new eyelid tremor like movements and lip/jaw tremor like vs clonus movement.  Bolused Keppra and increased maintenance dose.  Repeat EEG. CTH: concerning for progression of anoxic injury.  1/26: Repeat EEG unchanged and continues to show myoclonic seizures despite addition of Valproic acid yesterday. transfer to [facility] for continuous EEG 1/28: Continues to have myoclonic sz on EEG 2/2:  Remains intubated, no signs neurologic recovery  Patient remains vent dependant   This NP visited patient at the bedside as a follow up for palliative medicine needs and emotional support and to met with wife as scheduled at 1:00pm today.  Unfortunately she did not show for the meeting.  I spoke to her by telephone and stressed the importance of talking with the medical team regarding her husband's  situation and pending decisions.  She is overwhelmed !  She is struggling with her husbands grim prognosis,  financial impact of overall situation and trying to care for her family ( she has custody of her 74 yo grandson)       Emotional support offered.  Education offered on the likely trajectory of a long term vent/PEG dependant patient.  Explained the logistics of shifting to a full comfort path and liberating patient from the vent allowing for a natural death    She hopes to push out any decisions regarding her husband treatment  until his mother/Billy Cherry comes to [place].  Unfortunately she will not be here until February 17th.    I also spoke to Billy Cherry Series by telephone, she verbalizes understanding of her sons unfortunate situation.   However she does not want to impact her DILs decisions regarding de-escalation of care.  "These are her decisions to make and she will have to live with them"  Offered suggestion for joint meeting, wife at bedside and mother on face time,  to address the important pending decisions regarding Billy Cherry's care.    Billy Cherry will talk with patient's wife tonight and get back to me.   Hoping that Sunday will work for both of them.    Await callback.   Questions and concerns addressed     PMT will continue to support holistically.   Billy Lessen NP  Palliative Medicine Team Team Phone # [phone] Pager [phone]

## 2021-04-07 DIAGNOSIS — Z9911 Dependence on respirator [ventilator] status: Secondary | ICD-10-CM

## 2021-04-07 LAB — CBC WITH DIFFERENTIAL/PLATELET
Abs Immature Granulocytes: 0.13 10*3/uL — ABNORMAL HIGH (ref 0.00–0.07)
Basophils Absolute: 0 10*3/uL (ref 0.0–0.1)
Basophils Relative: 0 %
Eosinophils Absolute: 0.1 10*3/uL (ref 0.0–0.5)
Eosinophils Relative: 1 %
HCT: 37.2 % — ABNORMAL LOW (ref 39.0–52.0)
Hemoglobin: 11.6 g/dL — ABNORMAL LOW (ref 13.0–17.0)
Immature Granulocytes: 1 %
Lymphocytes Relative: 8 %
Lymphs Abs: 1.3 10*3/uL (ref 0.7–4.0)
MCH: 29.8 pg (ref 26.0–34.0)
MCHC: 31.2 g/dL (ref 30.0–36.0)
MCV: 95.6 fL (ref 80.0–100.0)
Monocytes Absolute: 2 10*3/uL — ABNORMAL HIGH (ref 0.1–1.0)
Monocytes Relative: 11 %
Neutro Abs: 14.1 10*3/uL — ABNORMAL HIGH (ref 1.7–7.7)
Neutrophils Relative %: 79 %
Platelets: 217 10*3/uL (ref 150–400)
RBC: 3.89 MIL/uL — ABNORMAL LOW (ref 4.22–5.81)
RDW: 13.8 % (ref 11.5–15.5)
WBC: 17.7 10*3/uL — ABNORMAL HIGH (ref 4.0–10.5)
nRBC: 0 % (ref 0.0–0.2)

## 2021-04-07 LAB — BASIC METABOLIC PANEL
Anion gap: 11 (ref 5–15)
BUN: 51 mg/dL — ABNORMAL HIGH (ref 6–20)
CO2: 27 mmol/L (ref 22–32)
Calcium: 8.5 mg/dL — ABNORMAL LOW (ref 8.9–10.3)
Chloride: 107 mmol/L (ref 98–111)
Creatinine, Ser: 1.4 mg/dL — ABNORMAL HIGH (ref 0.61–1.24)
GFR, Estimated: >60 mL/min (ref >60)
Glucose, Bld: 133 mg/dL — ABNORMAL HIGH (ref 70–99)
Potassium: 4.1 mmol/L (ref 3.5–5.1)
Sodium: 145 mmol/L (ref 135–145)

## 2021-04-07 LAB — GLUCOSE, CAPILLARY
Glucose-Capillary: 130 mg/dL — ABNORMAL HIGH (ref 70–99)
Glucose-Capillary: 128 mg/dL — ABNORMAL HIGH (ref 70–99)
Glucose-Capillary: 130 mg/dL — ABNORMAL HIGH (ref 70–99)
Glucose-Capillary: 122 mg/dL — ABNORMAL HIGH (ref 70–99)
Glucose-Capillary: 124 mg/dL — ABNORMAL HIGH (ref 70–99)

## 2021-04-07 MED ORDER — PROSOURCE TF PO LIQD
45.0000 mL | Freq: Three times a day (TID) | ORAL | Status: DC
  Administered 2021-04-07 – 2021-04-25 (×53): 45 mL
  Filled 2021-04-07 (×56): qty 45

## 2021-04-07 MED ORDER — SODIUM CHLORIDE 0.9 % IV SOLN: INTRAVENOUS | Status: DC | PRN

## 2021-04-07 MED ORDER — FUROSEMIDE 10 MG/ML IJ SOLN
80.0000 mg | Freq: Once | INTRAMUSCULAR | Status: AC
  Administered 2021-04-07: 80 mg via INTRAVENOUS
  Filled 2021-04-07: qty 8

## 2021-04-07 MED ORDER — VALPROIC ACID 250 MG/5ML PO SOLN
750.0000 mg | Freq: Three times a day (TID) | ORAL | Status: DC
  Administered 2021-04-07 – 2021-10-04 (×537): 750 mg
  Filled 2021-04-07 (×547): qty 15

## 2021-04-07 NOTE — Progress Notes (Signed)
NAME:  Delbert Testani, MRN:  [id number], DOB:  05/10/1970, LOS: 8 ADMISSION DATE:  03/30/2021, CONSULTATION DATE:  1/19 REFERRING MD:  Lorinda Creed, CHIEF COMPLAINT:  Found down, cardiac arrest   Brief Pt Description / Synopsis:  51 year old male with out-of-hospital V. fib cardiac arrest in setting of respiratory arrest due to suspected angioedema from ACE inhibitor leading to asphyxiation.  Concern on his 12 lead EKG for anterolateral ST elevation so he was taken to the cath lab where his study showed no significant coronary artery disease and an LVEF < 20%  Now withanoxic brain injury.  Pertinent  Medical History  Hypertension  Micro Data:  1/19 SARS CoV2/Flu > negative 1/19: MRSA PCR>> negative 1/21: Tracheal aspirate>> normal respiratory flora 1/21: Blood culture x2>> negztive  Antimicrobials:  Ceftriaxone 1/21>>1/25  Significant Hospital Events: Including procedures, antibiotic start and stop dates in addition to other pertinent events   1/19 admission, left heart cath without significant CAD.  Concern for anoxic brain injury 1/20: MRI pending, Neuro following 1/21: MRI shows areas of hypoxic-ischemic injury bilaterally. Fevered; cultures obtained. 1/23 severe brain damage 1/24 evidence of severe brain damage, remains critically ill 1/25: Plan for PEG today. Neuro exam unchanged.  Does exhibit new eyelid tremor like movements and lip/jaw tremor like vs clonus movement.  Bolused Keppra and increased maintenance dose.  Repeat EEG. CTH: concerning for progression of anoxic injury.  1/26: Repeat EEG unchanged and continues to show myoclonic seizures despite addition of Valproic acid yesterday. transfer to [facility] for continuous EEG 1/28: Continues to have myoclonic sz on EEG 1/29 No acute issues overnight 2/3 no meaningful neuro recovery  Interim History / Subjective:  No change in neurological condition. Cr slightly better, good uop  Objective   Blood pressure 134/84, pulse 81,  temperature 100.3 F (37.9 C), temperature source Axillary, resp. rate 20, weight 105.3 kg, SpO2 99 %.    Vent Mode: PSV;CPAP FiO2 (%):  [30 %] 30 % PEEP:  [5 cmH20] 5 cmH20 Pressure Support:  [10 cmH20] 10 cmH20   Intake/Output Summary (Last 24 hours) at 04/07/2021 [id number] Last data filed at 04/07/2021 0600 Gross per 24 hour  Intake 3860.06 ml  Output 1800 ml  Net 2060.06 ml     Filed Weights   04/01/21 0500 04/02/21 0334 04/03/21 0423  Weight: 104.2 kg 104.6 kg 105.3 kg   Examination:  General:  adult male intubated on MV in NAD HEENT: MM pink/moist, ETT, left nare cortrak, pupils 4/reactive, anicteric, intact corneals  Neuro:  occasional flexure posturing, does not respond to pain in any ext CV: warm, edema noted PULM:  non labored, CTA, not able to elicit cough, OP and nasal secretions noted GI: soft, bsx4 active, external catheter, Extremities: warm/dry, trace pedal edema  Skin: no rashes   Ancillary tests personally reviewed:    Cr stable, stable mild anemia  Assessment & Plan:  Principal Problem:   Cardiac arrest ([place]) Active Problems:   AKI (acute kidney injury) ([place])   Acute respiratory failure ([name])   Myoclonus   Anoxic encephalopathy ([name])   Hypernatremia   HFrEF (heart failure with reduced ejection fraction) ([name])  Severe anoxic brain injury following cardiac arrest: --Avoid centrally acting medications --Optimize electrolytes, continue free water, sodium decreasing --Goals of care via palliative  Acute hypoxemic respiratory failure due to severe anoxic brain injury and failure to protect airway: Cough and gag absent on exam. --PRVC, pressure support as tolerated --VAP bundle, stress ulcer prophylaxis  MSSA pneumonia: On trach aspirate  03/25/2021 --Plan 7 days cefazolin  Myoclonus: EEG discontinued.  Seems better controlled with current AEDs.  Heart failure with reduced ejection fraction: EF severely reduced less than 123456, grade 2 diastolic  dysfunction. --Not a candidate for goal-directed therapies given severe neurologic disease, despite this he is being treated for heart failure with Coreg and afterload reduction with hydralazine/Isordil --Lasix 2/3  Best Practice (right click and "Reselect all SmartList Selections" daily)   Diet/type: tubefeeds DVT prophylaxis: heparin SQ GI prophylaxis: PPI Lines: N/A Foley:  Yes, and it is still needed Code Status:  full code Last date of multidisciplinary goals of care discussion [2/2- PMT following for ongoing [place], would recommend comfort care as no reasonable expectation of any recovery of neurologic function]  CRITICAL CARE N/a   Lanier Clam, MD See Amion for contact info  04/07/2021, 9:29 AM

## 2021-04-07 NOTE — Progress Notes (Signed)
Nutrition Follow-up  DOCUMENTATION CODES:   Not applicable  INTERVENTION:   Tube feeding via Cortrak tube: Vital 1.5 at 60 ml/h (1440 ml per day) Decrease Prosource TF to 45 ml TID  Provides 2280 kcal, 130 gm protein, 1100 ml free water daily  200 ml free water every 2 hours  Total free water: 3500 ml    NUTRITION DIAGNOSIS:   Inadequate oral intake related to inability to eat as evidenced by NPO status. Ongoing.   GOAL:   Patient will meet greater than or equal to 90% of their needs Met with TF at goal  MONITOR:   Vent status, Labs, Weight trends, TF tolerance, I & O's  REASON FOR ASSESSMENT:   Ventilator, Consult Enteral/tube feeding initiation and management  ASSESSMENT:   51 year old male who originally presented [facility] on 1/19 after out-of-hospital cardiac arrest due to suspected angioedema from ACE inhibitor leading to asphyxiation. PMH of HTN, HLD.  Pt discussed during ICU rounds and with RN.  Per MD pt with severe ABI post cardiac arrest and recommends comfort care. Discussing GOC with family.   1/27 s/p cortrak placement  Medications reviewed and include: SSI, protonix Labs reviewed: BUN: 51 CBG's: 116-147  UOP: 1800 ml  I&O: +9 L   Diet Order:   Diet Order             Diet NPO time specified  Diet effective now                   EDUCATION NEEDS:   No education needs have been identified at this time  Skin:  Skin Assessment: Reviewed RN Assessment  Last BM:  2/2 x 2 (type 6 and 7)  Height:   Ht Readings from Last 1 Encounters:  03/28/21 5' 10.98" (1.803 m)    Weight:   Wt Readings from Last 1 Encounters:  04/03/21 105.3 kg    Ideal Body Weight:  78.2 kg  BMI:  Body mass index is 32.39 kg/m.  Estimated Nutritional Needs:   Kcal:  2200-2400  Protein:  120-145 grams  Fluid:  >/= 2.2 L  [name], RD, LDN, CNSC See AMiON for contact information

## 2021-04-07 NOTE — Progress Notes (Signed)
Patient ID: [name], male   DOB: 06/13/1970, 50 y.o.   MRN: [id number] ° ° ° °Progress Note from the Palliative Medicine Team at [facility] ° ° °Patient Name: [name]        °Date: 04/07/2021 °DOB: 02/11/1971  Age: 50 y.o. MRN#: [id number] °Attending Physician: Hunsucker, Matthew R, MD °Primary Care Physician: Patient, No Pcp Per (Inactive) °Admit Date: 03/30/2021 ° ° °Medical records reviewed  ° °50-year-old male with out-of-hospital V. fib cardiac arrest in setting of respiratory arrest due to suspected angioedema from ACE inhibitor leading to asphyxiation.  Concern on his 12 lead EKG for anterolateral ST elevation so he was taken to the cath lab where his study showed no significant coronary artery disease and an LVEF < 20%  Now withanoxic brain injury. ° °1/19 admission, left heart cath without significant CAD.  Concern for anoxic brain injury °1/20: MRI pending, Neuro following °1/21: MRI shows areas of hypoxic-ischemic injury bilaterally. Fevered; cultures obtained. °1/23 severe brain damage °1/24 evidence of severe brain damage, remains critically ill °1/25: Plan for PEG today. Neuro exam unchanged.  Does exhibit new eyelid tremor like movements and lip/jaw tremor like vs clonus movement.  Bolused Keppra and increased maintenance dose.  Repeat EEG. CTH: concerning for progression of anoxic injury.  °1/26: Repeat EEG unchanged and continues to show myoclonic seizures despite addition of Valproic acid yesterday. transfer to [facility] for continuous EEG °1/28: Continues to have myoclonic sz on EEG °2/2:  Remains intubated, no signs neurologic recovery ° °Patient remains vent dependant  ° °This NP visited patient at the bedside as a follow up for palliative medicine needs and emotional support. ° °I spoke to patient's mother today and she tells me she will be in [place] next week, likely Wednesday or Thursday. ° °I then spoke to wife by telephone to offer  her a time to met in person to her thoughts  and feelings regarding her husband's current medical situation; explore goals of care, and discuss anticipatory care needs.   ° ° °She tells me that she needs space and time before any further conversation, and that she really wants other family members to be present for those decisions.  ° °She is hoping to talk with the CCM doctor today , I let Dr Hunsucker know this and he will call her  ° °Discussed with Dr. Hunsucker, hopefully patient's family will be able to come to a decision focusing more on comfort and dignity,  prior to offering trach and PEG. °  ° °Questions and concerns addressed     PMT will continue to support holistically. ° ° °Mary Larach NP  °Palliative Medicine Team Team Phone # [phone] °Pager [phone] °  °

## 2021-04-08 LAB — GLUCOSE, CAPILLARY
Glucose-Capillary: 114 mg/dL — ABNORMAL HIGH (ref 70–99)
Glucose-Capillary: 123 mg/dL — ABNORMAL HIGH (ref 70–99)
Glucose-Capillary: 134 mg/dL — ABNORMAL HIGH (ref 70–99)
Glucose-Capillary: 138 mg/dL — ABNORMAL HIGH (ref 70–99)
Glucose-Capillary: 143 mg/dL — ABNORMAL HIGH (ref 70–99)
Glucose-Capillary: 149 mg/dL — ABNORMAL HIGH (ref 70–99)
Glucose-Capillary: 172 mg/dL — ABNORMAL HIGH (ref 70–99)

## 2021-04-08 LAB — CBC
HCT: 36.7 % — ABNORMAL LOW (ref 39.0–52.0)
Hemoglobin: 12 g/dL — ABNORMAL LOW (ref 13.0–17.0)
MCH: 30.7 pg (ref 26.0–34.0)
MCHC: 32.7 g/dL (ref 30.0–36.0)
MCV: 93.9 fL (ref 80.0–100.0)
Platelets: 257 10*3/uL (ref 150–400)
RBC: 3.91 MIL/uL — ABNORMAL LOW (ref 4.22–5.81)
RDW: 13.6 % (ref 11.5–15.5)
WBC: 15.9 10*3/uL — ABNORMAL HIGH (ref 4.0–10.5)
nRBC: 0 % (ref 0.0–0.2)

## 2021-04-08 LAB — BASIC METABOLIC PANEL
Anion gap: 12 (ref 5–15)
BUN: 50 mg/dL — ABNORMAL HIGH (ref 6–20)
CO2: 29 mmol/L (ref 22–32)
Calcium: 8.7 mg/dL — ABNORMAL LOW (ref 8.9–10.3)
Chloride: 104 mmol/L (ref 98–111)
Creatinine, Ser: 1.49 mg/dL — ABNORMAL HIGH (ref 0.61–1.24)
GFR, Estimated: 57 mL/min — ABNORMAL LOW (ref >60)
Glucose, Bld: 132 mg/dL — ABNORMAL HIGH (ref 70–99)
Potassium: 4.3 mmol/L (ref 3.5–5.1)
Sodium: 145 mmol/L (ref 135–145)

## 2021-04-08 MED ORDER — FUROSEMIDE 10 MG/ML IJ SOLN
80.0000 mg | Freq: Once | INTRAMUSCULAR | Status: AC
  Administered 2021-04-08: 80 mg via INTRAVENOUS
  Filled 2021-04-08: qty 8

## 2021-04-08 NOTE — Progress Notes (Signed)
NAME:  Billy Cherry, MRN:  [id number], DOB:  1970-05-06, LOS: 9 ADMISSION DATE:  03/30/2021, CONSULTATION DATE:  1/19 REFERRING MD:  Lorinda Creed, CHIEF COMPLAINT:  Found down, cardiac arrest   Brief Pt Description / Synopsis:  51 year old male with out-of-hospital V. fib cardiac arrest in setting of respiratory arrest due to suspected angioedema from ACE inhibitor leading to asphyxiation.  Concern on his 12 lead EKG for anterolateral ST elevation so he was taken to the cath lab where his study showed no significant coronary artery disease and an LVEF < 20%  Now withanoxic brain injury.  Pertinent  Medical History  Hypertension  Micro Data:  1/19 SARS CoV2/Flu > negative 1/19: MRSA PCR>> negative 1/21: Tracheal aspirate>> normal respiratory flora 1/21: Blood culture x2>> negztive  Antimicrobials:  Ceftriaxone 1/21>>1/25  Significant Hospital Events: Including procedures, antibiotic start and stop dates in addition to other pertinent events   1/19 admission, left heart cath without significant CAD.  Concern for anoxic brain injury 1/20: MRI pending, Neuro following 1/21: MRI shows areas of hypoxic-ischemic injury bilaterally. Fevered; cultures obtained. 1/23 severe brain damage 1/24 evidence of severe brain damage, remains critically ill 1/25: Plan for PEG today. Neuro exam unchanged.  Does exhibit new eyelid tremor like movements and lip/jaw tremor like vs clonus movement.  Bolused Keppra and increased maintenance dose.  Repeat EEG. CTH: concerning for progression of anoxic injury.  1/26: Repeat EEG unchanged and continues to show myoclonic seizures despite addition of Valproic acid yesterday. transfer to [facility] for continuous EEG 1/28: Continues to have myoclonic sz on EEG 1/29 No acute issues overnight 2/3 no meaningful neuro recovery. diuresed  Interim History / Subjective:  No change in neurological condition. Good UOP response to diuresis.   Objective   Blood pressure (!)  132/94, pulse 86, temperature 99.8 F (37.7 C), temperature source Axillary, resp. rate 20, weight 104 kg, SpO2 98 %.    Vent Mode: PRVC FiO2 (%):  [30 %] 30 % Set Rate:  [20 bmp-22 bmp] 22 bmp Vt Set:  [600 mL] 600 mL PEEP:  [5 cmH20] 5 cmH20 Pressure Support:  [10 cmH20] 10 cmH20 Plateau Pressure:  [16 cmH20-20 cmH20] 16 cmH20   Intake/Output Summary (Last 24 hours) at 04/08/2021 [id number] Last data filed at 04/08/2021 0600 Gross per 24 hour  Intake 3240.15 ml  Output 4200 ml  Net -959.85 ml     Filed Weights   04/02/21 0334 04/03/21 0423 04/08/21 0500  Weight: 104.6 kg 105.3 kg 104 kg   Examination:  General:  adult male intubated on MV in NAD HEENT: MM pink/moist, ETT, left nare cortrak, pupils 4/reactive, anicteric, intact corneals  Neuro:  occasional flexure posturing, does not respond to pain in any ext CV: warm, edema noted PULM:  non labored, CTA, not able to elicit cough, OP and nasal secretions noted GI: soft, bsx4 active, external catheter, Extremities: warm/dry, trace pedal edema  Skin: no rashes   Ancillary tests personally reviewed:    Stable anemia, mild leukocytosis  Assessment & Plan:  Principal Problem:   Cardiac arrest ([place]) Active Problems:   AKI (acute kidney injury) ([place])   Acute respiratory failure ([name])   Myoclonus   Anoxic encephalopathy ([name])   Hypernatremia   HFrEF (heart failure with reduced ejection fraction) ([name])  Severe anoxic brain injury following cardiac arrest: --Avoid centrally acting medications --Optimize electrolytes, continue free water, sodium decreasing --Goals of care via palliative  Acute hypoxemic respiratory failure due to severe anoxic brain injury and failure  to protect airway: Cough and gag absent on exam. --PRVC, pressure support as tolerated --VAP bundle, stress ulcer prophylaxis  MSSA pneumonia: On trach aspirate 04/04/2021 --Plan 7 days cefazolin  Myoclonus: EEG discontinued.  Seems better controlled with current  AEDs.  Heart failure with reduced ejection fraction: EF severely reduced less than 123456, grade 2 diastolic dysfunction. --Not a candidate for goal-directed therapies given severe neurologic disease, despite this he is being treated for heart failure with Coreg and afterload reduction with hydralazine/Isordil --Lasix 2/3  Best Practice (right click and "Reselect all SmartList Selections" daily)   Diet/type: tubefeeds DVT prophylaxis: heparin SQ GI prophylaxis: PPI Lines: N/A Foley:  Yes, and it is still needed Code Status:  full code Last date of multidisciplinary goals of care discussion [2/3 discussed with wife, do not anticipate any recovery, wife and mother discussing GOC]  CRITICAL CARE N/a   Lanier Clam, MD See Amion for contact info  04/08/2021, 8:23 AM

## 2021-04-08 NOTE — Progress Notes (Signed)
Orthopedic Tech Progress Note Patient Details:  Billy Cherry Oct 08, 1970 [id number]  Ortho Devices Type of Ortho Device: Prafo boot/shoe Ortho Device/Splint Location: BLE Ortho Device/Splint Interventions: Ordered, Application, Adjustment   Post Interventions Patient Tolerated: Well  [name] Jeri Modena 04/08/2021, 6:54 PM

## 2021-04-09 LAB — GLUCOSE, CAPILLARY
Glucose-Capillary: 122 mg/dL — ABNORMAL HIGH (ref 70–99)
Glucose-Capillary: 123 mg/dL — ABNORMAL HIGH (ref 70–99)
Glucose-Capillary: 127 mg/dL — ABNORMAL HIGH (ref 70–99)
Glucose-Capillary: 129 mg/dL — ABNORMAL HIGH (ref 70–99)
Glucose-Capillary: 145 mg/dL — ABNORMAL HIGH (ref 70–99)
Glucose-Capillary: 160 mg/dL — ABNORMAL HIGH (ref 70–99)

## 2021-04-09 LAB — BASIC METABOLIC PANEL
Anion gap: 12 (ref 5–15)
BUN: 54 mg/dL — ABNORMAL HIGH (ref 6–20)
CO2: 30 mmol/L (ref 22–32)
Calcium: 8.5 mg/dL — ABNORMAL LOW (ref 8.9–10.3)
Chloride: 100 mmol/L (ref 98–111)
Creatinine, Ser: 1.62 mg/dL — ABNORMAL HIGH (ref 0.61–1.24)
GFR, Estimated: 51 mL/min — ABNORMAL LOW (ref >60)
Glucose, Bld: 147 mg/dL — ABNORMAL HIGH (ref 70–99)
Potassium: 4.6 mmol/L (ref 3.5–5.1)
Sodium: 142 mmol/L (ref 135–145)

## 2021-04-09 LAB — CBC
HCT: 37.7 % — ABNORMAL LOW (ref 39.0–52.0)
Hemoglobin: 11.9 g/dL — ABNORMAL LOW (ref 13.0–17.0)
MCH: 30 pg (ref 26.0–34.0)
MCHC: 31.6 g/dL (ref 30.0–36.0)
MCV: 95 fL (ref 80.0–100.0)
Platelets: 251 10*3/uL (ref 150–400)
RBC: 3.97 MIL/uL — ABNORMAL LOW (ref 4.22–5.81)
RDW: 13.6 % (ref 11.5–15.5)
WBC: 15.8 10*3/uL — ABNORMAL HIGH (ref 4.0–10.5)
nRBC: 0 % (ref 0.0–0.2)

## 2021-04-09 MED ORDER — FREE WATER
200.0000 mL | Status: DC
  Administered 2021-04-09 – 2021-04-10 (×7): 200 mL

## 2021-04-09 NOTE — Progress Notes (Signed)
NAME:  Billy Cherry, MRN:  [id number], DOB:  [birth year], LOS: 32 ADMISSION DATE:  03/30/2021, CONSULTATION DATE:  1/19 REFERRING MD:  Lorinda Creed, CHIEF COMPLAINT:  Found down, cardiac arrest   Brief Pt Description / Synopsis:  51 year old male with out-of-hospital V. fib cardiac arrest in setting of respiratory arrest due to suspected angioedema from ACE inhibitor leading to asphyxiation.  Concern on his 12 lead EKG for anterolateral ST elevation so he was taken to the cath lab where his study showed no significant coronary artery disease and an LVEF < 20%  Now withanoxic brain injury.  Pertinent  Medical History  Hypertension  Micro Data:  1/19 SARS CoV2/Flu > negative 1/19: MRSA PCR>> negative 1/21: Tracheal aspirate>> normal respiratory flora 1/21: Blood culture x2>> negztive 1/32 MSSA sputum culture  Antimicrobials:  Ceftriaxone 1/21>>1/25 Cefazolin 1/31 --  Significant Hospital Events: Including procedures, antibiotic start and stop dates in addition to other pertinent events   1/19 admission, left heart cath without significant CAD.  Concern for anoxic brain injury 1/20: MRI pending, Neuro following 1/21: MRI shows areas of hypoxic-ischemic injury bilaterally. Fevered; cultures obtained. 1/23 severe brain damage 1/24 evidence of severe brain damage, remains critically ill 1/25: Plan for PEG today. Neuro exam unchanged.  Does exhibit new eyelid tremor like movements and lip/jaw tremor like vs clonus movement.  Bolused Keppra and increased maintenance dose.  Repeat EEG. CTH: concerning for progression of anoxic injury.  1/26: Repeat EEG unchanged and continues to show myoclonic seizures despite addition of Valproic acid yesterday. transfer to [facility] for continuous EEG 1/28: Continues to have myoclonic sz on EEG 1/29 No acute issues overnight 2/3 no meaningful neuro recovery. Diuresed 2/5 unchanged  Interim History / Subjective:  No change in neurological condition. Good  UOP response to diuresis. Na coming down, free water reduced  Objective   Blood pressure (!) 132/91, pulse 84, temperature 99.9 F (37.7 C), temperature source Axillary, resp. rate 20, weight 104.3 kg, SpO2 98 %.    Vent Mode: PSV;CPAP FiO2 (%):  [30 %] 30 % Set Rate:  [20 bmp] 20 bmp Vt Set:  [600 mL] 600 mL PEEP:  [5 cmH20] 5 cmH20 Delta P (Amplitude):  [24] 24 Pressure Support:  [10 cmH20] 10 cmH20 Plateau Pressure:  [16 cmH20-18 cmH20] 18 cmH20   Intake/Output Summary (Last 24 hours) at 04/09/2021 0851 Last data filed at 04/09/2021 [id number] Gross per 24 hour  Intake 4196.43 ml  Output 3525 ml  Net 671.43 ml     Filed Weights   04/03/21 0423 04/08/21 0500 04/09/21 0500  Weight: 105.3 kg 104 kg 104.3 kg   Examination:  General:  adult male intubated on MV in NAD HEENT: MM pink/moist, ETT, left nare cortrak, pupils 4/reactive, anicteric, intact corneals  Neuro:  occasional flexure posturing, does not respond to pain in any ext CV: warm, edema noted PULM:  non labored, CTA, not able to elicit cough, OP and nasal secretions noted GI: soft, bsx4 active, external catheter, Extremities: warm/dry, trace pedal edema  Skin: no rashes   Ancillary tests personally reviewed:    Stable anemia, mild leukocytosis, Cr stale overall  Assessment & Plan:  Principal Problem:   Cardiac arrest ([place]) Active Problems:   AKI (acute kidney injury) ([place])   Acute respiratory failure ([name])   Myoclonus   Anoxic encephalopathy ([name])   Hypernatremia   HFrEF (heart failure with reduced ejection fraction) ([name])  Severe anoxic brain injury following cardiac arrest: --Avoid centrally acting medications --Optimize electrolytes, decrease  free water, sodium decreasing --Goals of care via palliative  Acute hypoxemic respiratory failure due to severe anoxic brain injury and failure to protect airway: Cough and gag absent on exam. --PRVC, pressure support as tolerated --VAP bundle, stress ulcer  prophylaxis  MSSA pneumonia: On trach aspirate 04/04/2021 --Plan 7 days cefazolin  Myoclonus: EEG discontinued.  Seems better controlled with current AEDs.  Heart failure with reduced ejection fraction: EF severely reduced less than 123456, grade 2 diastolic dysfunction. --Not a candidate for goal-directed therapies given severe neurologic disease, despite this he is being treated for heart failure with Coreg and afterload reduction with hydralazine/Isordil --Lasix 2/3 and 2/4  Best Practice (right click and "Reselect all SmartList Selections" daily)   Diet/type: tubefeeds DVT prophylaxis: heparin SQ GI prophylaxis: PPI Lines: N/A Foley:  Yes, and it is still needed Code Status:  full code Last date of multidisciplinary goals of care discussion [2/3 discussed with wife, do not anticipate any recovery, wife and mother discussing GOC]  CRITICAL CARE N/a   Lanier Clam, MD See Amion for contact info  04/09/2021, 8:51 AM

## 2021-04-09 NOTE — Progress Notes (Signed)
Pt placed on PS/CPAP 10/5 30% and is tolerating well. RT will continue to monitor.

## 2021-04-10 ENCOUNTER — Inpatient Hospital Stay (HOSPITAL_COMMUNITY): Payer: BC Managed Care – PPO

## 2021-04-10 LAB — CBC
HCT: 36.6 % — ABNORMAL LOW (ref 39.0–52.0)
Hemoglobin: 11.5 g/dL — ABNORMAL LOW (ref 13.0–17.0)
MCH: 29.7 pg (ref 26.0–34.0)
MCHC: 31.4 g/dL (ref 30.0–36.0)
MCV: 94.6 fL (ref 80.0–100.0)
Platelets: UNDETERMINED 10*3/uL (ref 150–400)
RBC: 3.87 MIL/uL — ABNORMAL LOW (ref 4.22–5.81)
RDW: 13.6 % (ref 11.5–15.5)
WBC: 16.1 10*3/uL — ABNORMAL HIGH (ref 4.0–10.5)
nRBC: 0 % (ref 0.0–0.2)

## 2021-04-10 LAB — GLUCOSE, CAPILLARY
Glucose-Capillary: 143 mg/dL — ABNORMAL HIGH (ref 70–99)
Glucose-Capillary: 129 mg/dL — ABNORMAL HIGH (ref 70–99)
Glucose-Capillary: 150 mg/dL — ABNORMAL HIGH (ref 70–99)
Glucose-Capillary: 129 mg/dL — ABNORMAL HIGH (ref 70–99)
Glucose-Capillary: 139 mg/dL — ABNORMAL HIGH (ref 70–99)

## 2021-04-10 LAB — BASIC METABOLIC PANEL
Anion gap: 13 (ref 5–15)
BUN: 50 mg/dL — ABNORMAL HIGH (ref 6–20)
CO2: 27 mmol/L (ref 22–32)
Calcium: 8.4 mg/dL — ABNORMAL LOW (ref 8.9–10.3)
Chloride: 101 mmol/L (ref 98–111)
Creatinine, Ser: 1.48 mg/dL — ABNORMAL HIGH (ref 0.61–1.24)
GFR, Estimated: 57 mL/min — ABNORMAL LOW (ref >60)
Glucose, Bld: 138 mg/dL — ABNORMAL HIGH (ref 70–99)
Potassium: 5.1 mmol/L (ref 3.5–5.1)
Sodium: 141 mmol/L (ref 135–145)

## 2021-04-10 MED ORDER — FUROSEMIDE 10 MG/ML IJ SOLN
80.0000 mg | Freq: Once | INTRAMUSCULAR | Status: AC
  Administered 2021-04-10: 80 mg via INTRAVENOUS
  Filled 2021-04-10: qty 8

## 2021-04-10 MED ORDER — FREE WATER
200.0000 mL | Freq: Four times a day (QID) | Status: DC
  Administered 2021-04-10 – 2021-04-13 (×12): 200 mL

## 2021-04-10 NOTE — Progress Notes (Addendum)
NAME:  Billy Cherry, MRN:  [id number], DOB:  12/19/1970, LOS: 14 ADMISSION DATE:  03/30/2021, CONSULTATION DATE:  1/19 REFERRING MD:  Lorinda Creed, CHIEF COMPLAINT:  Found down, cardiac arrest   Brief Pt Description / Synopsis:  51 year old male with out-of-hospital V. fib cardiac arrest in setting of respiratory arrest due to suspected angioedema from ACE inhibitor leading to asphyxiation.  Concern on his 12 lead EKG for anterolateral ST elevation so he was taken to the cath lab where his study showed no significant coronary artery disease and an LVEF < 20%  Now withanoxic brain injury.  Pertinent  Medical History  Hypertension  Micro Data:  1/19 SARS CoV2/Flu > negative 1/19: MRSA PCR>> negative 1/21: Tracheal aspirate>> normal respiratory flora 1/21: Blood culture x2>> negztive 1/32 MSSA sputum culture  Antimicrobials:  Ceftriaxone 1/21>>1/25 Cefazolin 1/31 --  Significant Hospital Events: Including procedures, antibiotic start and stop dates in addition to other pertinent events   1/19 admission, left heart cath without significant CAD.  Concern for anoxic brain injury 1/20: MRI pending, Neuro following 1/21: MRI shows areas of hypoxic-ischemic injury bilaterally. Fevered; cultures obtained. 1/23 severe brain damage 1/24 evidence of severe brain damage, remains critically ill 1/25: Plan for PEG today. Neuro exam unchanged.  Does exhibit new eyelid tremor like movements and lip/jaw tremor like vs clonus movement.  Bolused Keppra and increased maintenance dose.  Repeat EEG. CTH: concerning for progression of anoxic injury.  1/26: Repeat EEG unchanged and continues to show myoclonic seizures despite addition of Valproic acid yesterday. transfer to [facility] for continuous EEG 1/28: Continues to have myoclonic sz on EEG 1/29 No acute issues overnight 2/3 no meaningful neuro recovery. Diuresed 2/5 unchanged 2/6 New fever to 102, WBC 16K  Interim History / Subjective:  No change in  neurological condition.  Remains net + 8554. 2900 cc urine output last 24 hours Good diuresis over the weekend.  New fever of 102 Axillary 2/6  am  Labs : Na 141, K 5.1, Creatinine 1.48/ BUN 50  WBC 16.1/ HGB 11.5/Platelets are clumped Weaning on 10/5 with RR of 26   Objective   Blood pressure (!) 161/108, pulse 100, temperature (!) 102.3 F (39.1 C), temperature source Axillary, resp. rate (!) 31, weight 104.2 kg, SpO2 98 %.    Vent Mode: PSV;CPAP FiO2 (%):  [30 %] 30 % Set Rate:  [20 bmp] 20 bmp Vt Set:  [600 mL] 600 mL PEEP:  [5 cmH20] 5 cmH20 Pressure Support:  [10 cmH20] 10 cmH20 Plateau Pressure:  [20 cmH20-21 cmH20] 20 cmH20   Intake/Output Summary (Last 24 hours) at 04/10/2021 0816 Last data filed at 04/10/2021 [id number] Gross per 24 hour  Intake 2485.3 ml  Output 2925 ml  Net -439.7 ml    Filed Weights   04/08/21 0500 04/09/21 0500 04/10/21 0500  Weight: 104 kg 104.3 kg 104.2 kg   Examination:  General:  adult male intubated on MV , diaphoretic  HEENT: MM pink/moist, ETT, left nare cortrak, pupils 4/reactive, deviated downward, anicteric, intact corneals  Neuro:  eyes closed, occasional flexure posturing, does not respond to pain in any ext CV: warm, 1+ edema noted PULM:  non labored, but rapid, CTA, not able to elicit cough, OP and nasal secretions noted GI: soft, bsx4 active, external catheter, Extremities: warm/dry, trace pedal edema  Skin: no rashes, no lesions , clean dry and intact   Ancillary tests personally reviewed:    New fever of 102, Weaning,  Cr slightly down overall, No change  in neuro status  Assessment & Plan:  Principal Problem:   Cardiac arrest [facility]) Active Problems:   AKI (acute kidney injury) ([place])   Acute respiratory failure ([name])   Myoclonus   Anoxic encephalopathy ([name])   Hypernatremia   HFrEF (heart failure with reduced ejection fraction) ([name])  Severe anoxic brain injury following cardiac arrest: --Avoid centrally acting  medications --Optimize electrolytes, decrease free water, sodium decreasing --Goals of care via palliative  Acute hypoxemic respiratory failure due to severe anoxic brain injury and failure to protect airway: Cough and gag absent on exam. Intubated 03/23/2021 ay [place] [facility] 18 for ETT --PRVC, pressure support as tolerated --VAP bundle, stress ulcer prophylaxis - If family unable to decide on goals of care will need to consider trach very soon   MSSA pneumonia: On trach aspirate 04/04/2021 New fever 04/10/2021 --Plan 7 days cefazolin - Will re-culture blood and sputum  - CXR now   Myoclonus:  - EEG discontinued.   - Seems better controlled with current AEDs. - Maintain Mag > 2   Heart failure with reduced ejection fraction:  EF severely reduced less than [id number], grade 2 diastolic dysfunction. --Not a candidate for goal-directed therapies given severe neurologic disease, despite this he is being treated for heart failure with Coreg and afterload reduction with hydralazine/Isordil --Lasix 2/3 and 2/4, 2/6  Best Practice (right click and "Reselect all SmartList Selections" daily)   Diet/type: tubefeeds DVT prophylaxis: heparin SQ GI prophylaxis: PPI Lines: N/A Foley:  Yes, and it is still needed Code Status:  full code Last date of multidisciplinary goals of care discussion [2/3 discussed with wife, do not anticipate any recovery, wife and mother discussing GOC, but have been absent from scheduled meetings with Palliative care]. Mother to be in town 2/8/ and 2/9 , so need to plan [place] meeting to incluse her then. Appreciate Palliation assistance with patient care.   Critical Care Time : 35 minutes  CRITICAL CARE Magdalen Spatz, MSN, AGACNP-BC [place] for personal pager PCCM on call pager [phone]  04/10/2021, 8:16 AM

## 2021-04-10 NOTE — Progress Notes (Signed)
[facility] Progress Note Patient Name: Billy Cherry DOB: 06/17/1970 MRN: [id number]   Date of Service  04/10/2021  HPI/Events of Note  Bed side RN asking for Transfer request order to [facility].   eICU Interventions  Reviewed and ordered     Intervention Category Minor Interventions: Routine modifications to care plan (e.g. PRN medications for pain, fever)  Elmer Sow 04/10/2021, 8:03 PM

## 2021-04-10 NOTE — Progress Notes (Signed)
Attempted to call the patient's wife to notify her of the patient transferring to another unit, she did not answer, a voicemail was left.

## 2021-04-10 NOTE — Progress Notes (Signed)
Patient transported from 4N32 to 2H22 with no complications.

## 2021-04-11 LAB — CBC WITH DIFFERENTIAL/PLATELET
Abs Immature Granulocytes: 0.17 10*3/uL — ABNORMAL HIGH (ref 0.00–0.07)
Basophils Absolute: 0 10*3/uL (ref 0.0–0.1)
Basophils Relative: 0 %
Eosinophils Absolute: 0.1 10*3/uL (ref 0.0–0.5)
Eosinophils Relative: 1 %
HCT: 36.3 % — ABNORMAL LOW (ref 39.0–52.0)
Hemoglobin: 11.4 g/dL — ABNORMAL LOW (ref 13.0–17.0)
Immature Granulocytes: 1 %
Lymphocytes Relative: 10 %
Lymphs Abs: 1.8 10*3/uL (ref 0.7–4.0)
MCH: 30.2 pg (ref 26.0–34.0)
MCHC: 31.4 g/dL (ref 30.0–36.0)
MCV: 96 fL (ref 80.0–100.0)
Monocytes Absolute: 2.6 10*3/uL — ABNORMAL HIGH (ref 0.1–1.0)
Monocytes Relative: 15 %
Neutro Abs: 12.9 10*3/uL — ABNORMAL HIGH (ref 1.7–7.7)
Neutrophils Relative %: 73 %
Platelets: 353 10*3/uL (ref 150–400)
RBC: 3.78 MIL/uL — ABNORMAL LOW (ref 4.22–5.81)
RDW: 13.9 % (ref 11.5–15.5)
WBC: 17.7 10*3/uL — ABNORMAL HIGH (ref 4.0–10.5)
nRBC: 0 % (ref 0.0–0.2)

## 2021-04-11 LAB — GLUCOSE, CAPILLARY
Glucose-Capillary: 114 mg/dL — ABNORMAL HIGH (ref 70–99)
Glucose-Capillary: 126 mg/dL — ABNORMAL HIGH (ref 70–99)
Glucose-Capillary: 135 mg/dL — ABNORMAL HIGH (ref 70–99)
Glucose-Capillary: 137 mg/dL — ABNORMAL HIGH (ref 70–99)
Glucose-Capillary: 137 mg/dL — ABNORMAL HIGH (ref 70–99)
Glucose-Capillary: 137 mg/dL — ABNORMAL HIGH (ref 70–99)

## 2021-04-11 LAB — BASIC METABOLIC PANEL
Anion gap: 12 (ref 5–15)
BUN: 54 mg/dL — ABNORMAL HIGH (ref 6–20)
CO2: 28 mmol/L (ref 22–32)
Calcium: 8.4 mg/dL — ABNORMAL LOW (ref 8.9–10.3)
Chloride: 105 mmol/L (ref 98–111)
Creatinine, Ser: 1.56 mg/dL — ABNORMAL HIGH (ref 0.61–1.24)
GFR, Estimated: 54 mL/min — ABNORMAL LOW (ref >60)
Glucose, Bld: 141 mg/dL — ABNORMAL HIGH (ref 70–99)
Potassium: 4.8 mmol/L (ref 3.5–5.1)
Sodium: 145 mmol/L (ref 135–145)

## 2021-04-11 LAB — MAGNESIUM: Magnesium: 2.4 mg/dL (ref 1.7–2.4)

## 2021-04-11 NOTE — Progress Notes (Signed)
This chaplain responded to unit page for family spiritual care.  The chaplain phone Pt. RN-Carly for an update. The chaplain understands family will visit on Wednesday.    The chaplain is available for F/U spiritual care as needed with Pt. Wife and Pt. Mother.  Chaplain Stephanie Acre [phone]

## 2021-04-11 NOTE — Progress Notes (Signed)
NAME:  Billy Cherry, MRN:  [id number], DOB:  [birth year], LOS: 12 ADMISSION DATE:  03/30/2021, CONSULTATION DATE:  1/19 REFERRING MD:  Evette Georges, CHIEF COMPLAINT:  Found down, cardiac arrest   Brief Pt Description / Synopsis:  51 year old male with out-of-hospital V. fib cardiac arrest in setting of respiratory arrest due to suspected angioedema from ACE inhibitor leading to asphyxiation.  Concern on his 12 lead EKG for anterolateral ST elevation so he was taken to the cath lab where his study showed no significant coronary artery disease and an LVEF < 20%  Now withanoxic brain injury.  Pertinent  Medical History  Hypertension  Micro Data:  1/19 SARS CoV2/Flu > negative 1/19: MRSA PCR>> negative 1/21: Tracheal aspirate>> normal respiratory flora 1/21: Blood culture x2>> negztive 1/32 MSSA sputum culture  Antimicrobials:  Ceftriaxone 1/21>>1/25 Cefazolin 1/31 --  Significant Hospital Events: Including procedures, antibiotic start and stop dates in addition to other pertinent events   1/19 admission, left heart cath without significant CAD.  Concern for anoxic brain injury 1/20: MRI pending, Neuro following 1/21: MRI shows areas of hypoxic-ischemic injury bilaterally. Fevered; cultures obtained. 1/23 severe brain damage 1/24 evidence of severe brain damage, remains critically ill 1/25: Plan for PEG today. Neuro exam unchanged.  Does exhibit new eyelid tremor like movements and lip/jaw tremor like vs clonus movement.  Bolused Keppra and increased maintenance dose.  Repeat EEG. CTH: concerning for progression of anoxic injury.  1/26: Repeat EEG unchanged and continues to show myoclonic seizures despite addition of Valproic acid yesterday. transfer to [facility] for continuous EEG 1/28: Continues to have myoclonic sz on EEG 1/29 No acute issues overnight 2/3 no meaningful neuro recovery. Diuresed 2/5 unchanged 2/6 New fever to 102, WBC 16K  Interim History / Subjective:  No  events. Small fever spike yesterday. Remains comatose on vent.  Objective   Blood pressure (!) 140/96, pulse 92, temperature 98.5 F (36.9 C), temperature source Oral, resp. rate 16, weight 104 kg, SpO2 94 %.    Vent Mode: PSV;CPAP FiO2 (%):  [30 %] 30 % Set Rate:  [20 bmp] 20 bmp Vt Set:  [600 mL] 600 mL PEEP:  [5 cmH20] 5 cmH20 Pressure Support:  [10 cmH20] 10 cmH20 Plateau Pressure:  [17 cmH20-20 cmH20] 20 cmH20   Intake/Output Summary (Last 24 hours) at 04/11/2021 0827 Last data filed at 04/11/2021 0600 Gross per 24 hour  Intake 2987.39 ml  Output 3700 ml  Net -712.61 ml     Filed Weights   04/09/21 0500 04/10/21 0500 04/11/21 0500  Weight: 104.3 kg 104.2 kg 104 kg   Examination:  No distress on vent Pupils small, equal reactive Lungs clear +pupillary/corneal/gag/cough/doll's GCS3 otherwise Triggers vent  BMP stable WBC up No new chest imaging Sputum pending, on cefazolin for MSSA in sputum 04/04/20  Ancillary tests personally reviewed:    New fever of 102, Weaning,  Cr slightly down overall, No change in neuro status  Assessment & Plan:  OOH Vfib arrest Post arrest anoxic brain injury, severe Post arrest myoclonus improved with klonopin, no AEDs recommended Post arrest respiratory failure Post arrest cardiomyopathy Post arrest AKI, improved MSSA RLL PNA- still febrile, WBC rising despite cefazolin, repeat tracheal aspirate pending  - Continue vent support, VAP prevention bundle - f/u tracheal aspirate, continue cefazolin for now - Continue klonipin, if breakthrough myoclonus okay to go up on this - Spoke with wife on phone, mother en route from [place] by train to arrive tomorrow.  Then we will have in-person  meeting to discuss trach/PEG/LTACH vs. Comfort care.  Suspect family will proceed with former.  Best Practice (right click and "Reselect all SmartList Selections" daily)   Diet/type: tubefeeds DVT prophylaxis: heparin SQ GI prophylaxis:  PPI Lines: N/A Foley:  Yes, and it is still needed Code Status:  full code Last date of multidisciplinary goals of care discussion [for tomorrow]   Patient critically ill due to resp failure Interventions to address this today vent titration, family discussions Risk of deterioration without these interventions is high  I personally spent 33 minutes providing critical care not including any separately billable procedures  Myrla Halsted MD [place] [facility]  Prefer epic messenger for cross cover needs If after hours, please call E-link

## 2021-04-11 NOTE — Progress Notes (Addendum)
Patient ID: Billy Cherry, male   DOB: 1970/11/02, 51 y.o.   MRN: [id number]    Progress Note from the Palliative Medicine Team at [facility]   Patient Name: Billy Cherry        Date: 04/11/2021 DOB: [birth year]  Age: 51 y.o. MRN#: [id number] Attending Physician: Billy Glass, MD Primary Care Physician: Patient, No Pcp Per (Inactive) Admit Date: 03/30/2021   Medical records reviewed   51 year old male with out-of-hospital V. fib cardiac arrest in setting of respiratory arrest due to suspected angioedema from ACE inhibitor leading to asphyxiation.  Concern on his 12 lead EKG for anterolateral ST elevation so he was taken to the cath lab where his study showed no significant coronary artery disease and an LVEF < 20%  Now withanoxic brain injury.  1/19 admission, left heart cath without significant CAD.  Concern for anoxic brain injury 1/20: MRI pending, Neuro following 1/21: MRI shows areas of hypoxic-ischemic injury bilaterally. Fevered; cultures obtained. 1/23 severe brain damage 1/24 evidence of severe brain damage, remains critically ill 1/25: Plan for PEG today. Neuro exam unchanged.  Does exhibit new eyelid tremor like movements and lip/jaw tremor like vs clonus movement.  Bolused Keppra and increased maintenance dose.  Repeat EEG. CTH: concerning for progression of anoxic injury.  1/26: Repeat EEG unchanged and continues to show myoclonic seizures despite addition of Valproic acid yesterday. transfer to [facility] for continuous EEG 1/28: Continues to have myoclonic sz on EEG 2/2:  Remains intubated, no signs neurologic recovery 2/6 : New fever 102, WBC elevated - transferred to 2 H    This NP visited patient at the bedside as a follow up for palliative medicine needs and emotional support. Patient remains vent dependant.  Severe anoxic brain injury.  I spoke to patient's mother/Billy Cherry today and she tells me she will be in [place] on Thursday.  I then spoke to wife by  telephone to explore her thoughts and feelings regarding her husband's current medical situation.  She tells me that she is frustrated with the "constant phone calls asking her what she is going to do".  She goes on to tell me that yes her mother-in-law will be in town this week but that she will have no bearing on decisions that are made regarding Mr. Billy Cherry.  Although  she welcomes her mother-in-law's presence.    Emotional support offered, I assured her that ultimately these are her decisions to be made with the guidance of the healthcare team.  She again asks for  space and time before any further conversation.  She feels overwhelmed by phone calls and conversation by various healthcare providers.  In an attempt to streamline conversations and facilitate coordination of care CCM will be her main point of contact regarding GOCs and treatment options.   Patient's wife appreciates this suggestion.  Wife is encouraged to call palliative medicine team with questions or concerns  Discussed with Dr. Myrla Cherry.  Questions and concerns addressed    PMT will continue to shadow the chart, please call if we can be of assistance.   Total time spent on the unit was 55 minutes greater than 50% of the time   counseling coordination of care  Billy Creed NP  Palliative Medicine Team Team Phone # [phone] Pager [phone]

## 2021-04-12 LAB — BASIC METABOLIC PANEL
Anion gap: 13 (ref 5–15)
BUN: 57 mg/dL — ABNORMAL HIGH (ref 6–20)
CO2: 28 mmol/L (ref 22–32)
Calcium: 8.5 mg/dL — ABNORMAL LOW (ref 8.9–10.3)
Chloride: 104 mmol/L (ref 98–111)
Creatinine, Ser: 1.42 mg/dL — ABNORMAL HIGH (ref 0.61–1.24)
GFR, Estimated: >60 mL/min (ref >60)
Glucose, Bld: 134 mg/dL — ABNORMAL HIGH (ref 70–99)
Potassium: 4.9 mmol/L (ref 3.5–5.1)
Sodium: 145 mmol/L (ref 135–145)

## 2021-04-12 LAB — CBC
HCT: 37.8 % — ABNORMAL LOW (ref 39.0–52.0)
Hemoglobin: 11.7 g/dL — ABNORMAL LOW (ref 13.0–17.0)
MCH: 29.9 pg (ref 26.0–34.0)
MCHC: 31 g/dL (ref 30.0–36.0)
MCV: 96.7 fL (ref 80.0–100.0)
Platelets: 321 10*3/uL (ref 150–400)
RBC: 3.91 MIL/uL — ABNORMAL LOW (ref 4.22–5.81)
RDW: 14 % (ref 11.5–15.5)
WBC: 21.4 10*3/uL — ABNORMAL HIGH (ref 4.0–10.5)
nRBC: 0 % (ref 0.0–0.2)

## 2021-04-12 LAB — GLUCOSE, CAPILLARY
Glucose-Capillary: 112 mg/dL — ABNORMAL HIGH (ref 70–99)
Glucose-Capillary: 120 mg/dL — ABNORMAL HIGH (ref 70–99)
Glucose-Capillary: 123 mg/dL — ABNORMAL HIGH (ref 70–99)
Glucose-Capillary: 133 mg/dL — ABNORMAL HIGH (ref 70–99)
Glucose-Capillary: 137 mg/dL — ABNORMAL HIGH (ref 70–99)
Glucose-Capillary: 138 mg/dL — ABNORMAL HIGH (ref 70–99)
Glucose-Capillary: 139 mg/dL — ABNORMAL HIGH (ref 70–99)

## 2021-04-12 MED ORDER — SODIUM CHLORIDE 0.9 % IV SOLN
2.0000 g | Freq: Three times a day (TID) | INTRAVENOUS | Status: DC
  Administered 2021-04-12 – 2021-04-13 (×4): 2 g via INTRAVENOUS
  Filled 2021-04-12 (×5): qty 2

## 2021-04-12 NOTE — Progress Notes (Signed)
Pharmacy Antibiotic Note  Billy Cherry is a 51 y.o. male admitted on 03/30/2021 Vfib arrest d/t angioedema from ACEi and now concern for hospital-acquired pneumonia. WBC 21.4, tmax 99.8. Pharmacy has been consulted for cefepime dosing.  Plan: Cefepime 2g q8h F/u renal function, clinical status and length of therapy  Weight: 99.2 kg (218 lb 11.1 oz)  Temp (24hrs), Avg:98.9 F (37.2 C), Min:98 F (36.7 C), Max:99.8 F (37.7 C)  Recent Labs  Lab 04/08/21 0752 04/09/21 0222 04/10/21 0458 04/11/21 0307 04/12/21 0251  WBC 15.9* 15.8* 16.1* 17.7* 21.4*  CREATININE 1.49* 1.62* 1.48* 1.56* 1.42*    Estimated Creatinine Clearance: 74.7 mL/min (A) (by C-G formula based on SCr of 1.42 mg/dL (H)).    Allergies  Allergen Reactions   Lisinopril Swelling    angioedema    Antimicrobials this admission: CTX 1/21 >> 1/25 Cefepime 1/31 >> 2/2 Ancef 2/2 >> 2/6  Dose adjustments this admission:  Microbiology results: 1/31 TA - MSSA and Corynebacterium  2/6 Bcx NGTD 2/6 resp cx: rare gram + cocci in pairs and GNR  Thank you for allowing pharmacy to be a part of this patients care.  Levonne Spiller 04/12/2021 7:19 AM

## 2021-04-12 NOTE — Progress Notes (Signed)
NAME:  Billy Cherry, MRN:  [id number], DOB:  [birth year], LOS: 62 ADMISSION DATE:  03/30/2021, CONSULTATION DATE:  1/19 REFERRING MD:  Lorinda Creed, CHIEF COMPLAINT:  Found down, cardiac arrest   Brief Pt Description / Synopsis:  51 year old male with out-of-hospital V. fib cardiac arrest in setting of respiratory arrest due to suspected angioedema from ACE inhibitor leading to asphyxiation.  Concern on his 12 lead EKG for anterolateral ST elevation so he was taken to the cath lab where his study showed no significant coronary artery disease and an LVEF < 20%  Now withanoxic brain injury.  Pertinent  Medical History  Hypertension  Micro Data:  1/19 SARS CoV2/Flu > negative 1/19: MRSA PCR>> negative 1/21: Tracheal aspirate>> normal respiratory flora 1/21: Blood culture x2>> negztive 1/32 MSSA sputum culture  Antimicrobials:  Ceftriaxone 1/21>>1/25 Cefazolin 1/31 --  Significant Hospital Events: Including procedures, antibiotic start and stop dates in addition to other pertinent events   1/19 admission, left heart cath without significant CAD.  Concern for anoxic brain injury 1/20: MRI pending, Neuro following 1/21: MRI shows areas of hypoxic-ischemic injury bilaterally. Fevered; cultures obtained. 1/23 severe brain damage 1/24 evidence of severe brain damage, remains critically ill 1/25: Plan for PEG today. Neuro exam unchanged.  Does exhibit new eyelid tremor like movements and lip/jaw tremor like vs clonus movement.  Bolused Keppra and increased maintenance dose.  Repeat EEG. CTH: concerning for progression of anoxic injury.  1/26: Repeat EEG unchanged and continues to show myoclonic seizures despite addition of Valproic acid yesterday. transfer to [facility] for continuous EEG 1/28: Continues to have myoclonic sz on EEG 1/29 No acute issues overnight 2/3 no meaningful neuro recovery. Diuresed 2/5 unchanged 2/6 New fever to 102, WBC 16K  Interim History / Subjective:  Remains  comatose on vent. WBC going up.  Objective   Blood pressure (!) 137/98, pulse 89, temperature 98.9 F (37.2 C), temperature source Oral, resp. rate 20, weight 99.2 kg, SpO2 98 %.    Vent Mode: PRVC FiO2 (%):  [30 %] 30 % Set Rate:  [20 bmp] 20 bmp Vt Set:  [600 mL] 600 mL PEEP:  [5 cmH20] 5 cmH20 Pressure Support:  [10 cmH20] 10 cmH20 Plateau Pressure:  [16 cmH20-17 cmH20] 16 cmH20   Intake/Output Summary (Last 24 hours) at 04/12/2021 [id number] Last data filed at 04/12/2021 0600 Gross per 24 hour  Intake 1120 ml  Output 2100 ml  Net -980 ml     Filed Weights   04/10/21 0500 04/11/21 0500 04/12/21 0353  Weight: 104.2 kg 104 kg 99.2 kg   Examination:  No distress No motor response to pain Occasional eyelid twitching Rhonci on auscultation Brainstem reflexes intact Triggers vent  WBC up again Cr stable Sugars okay Sputum with staph aureus and GNR  Ancillary tests personally reviewed:    New fever of 102, Weaning,  Cr slightly down overall, No change in neuro status  Assessment & Plan:  OOH Vfib arrest Post arrest anoxic brain injury, severe Post arrest myoclonus improved with klonopin, no AEDs recommended Post arrest respiratory failure Post arrest cardiomyopathy Post arrest AKI, improved Rising white count- GNR and staph aureus in sputum; MSSA previously  - Continue vent support, VAP prevention bundle - start cefepime, f/u tracheal aspirate data, check Pct, AM CXR - Continue klonipin, if breakthrough myoclonus okay to go up on this - Mother arriving from [place] today, will have RN reach out later this evening to set up family meeting tomorrow to discuss disposition: hospice vs.  Trach/PEG/LTACH  Best Practice (right click and "Reselect all SmartList Selections" daily)   Diet/type: tubefeeds DVT prophylaxis: heparin SQ GI prophylaxis: PPI Lines: N/A Foley:  Yes, and it is still needed Code Status:  full code Last date of multidisciplinary goals of care  discussion [for tomorrow]   Patient critically ill due to resp failure Interventions to address this today vent titration, family discussions Risk of deterioration without these interventions is high  I personally spent 34 minutes providing critical care not including any separately billable procedures  Erskine Emery MD [place] [facility]  Prefer epic messenger for cross cover needs If after hours, please call E-link

## 2021-04-13 ENCOUNTER — Other Ambulatory Visit: Payer: Self-pay

## 2021-04-13 ENCOUNTER — Inpatient Hospital Stay (HOSPITAL_COMMUNITY): Payer: BC Managed Care – PPO

## 2021-04-13 ENCOUNTER — Encounter (HOSPITAL_COMMUNITY): Payer: Self-pay | Admitting: Pulmonary Disease

## 2021-04-13 LAB — CBC
HCT: 34.5 % — ABNORMAL LOW (ref 39.0–52.0)
Hemoglobin: 10.7 g/dL — ABNORMAL LOW (ref 13.0–17.0)
MCH: 30.2 pg (ref 26.0–34.0)
MCHC: 31 g/dL (ref 30.0–36.0)
MCV: 97.5 fL (ref 80.0–100.0)
Platelets: 344 10*3/uL (ref 150–400)
RBC: 3.54 MIL/uL — ABNORMAL LOW (ref 4.22–5.81)
RDW: 14 % (ref 11.5–15.5)
WBC: 17.4 10*3/uL — ABNORMAL HIGH (ref 4.0–10.5)
nRBC: 0 % (ref 0.0–0.2)

## 2021-04-13 LAB — BASIC METABOLIC PANEL
Anion gap: 9 (ref 5–15)
BUN: 59 mg/dL — ABNORMAL HIGH (ref 6–20)
CO2: 30 mmol/L (ref 22–32)
Calcium: 8.6 mg/dL — ABNORMAL LOW (ref 8.9–10.3)
Chloride: 109 mmol/L (ref 98–111)
Creatinine, Ser: 1.53 mg/dL — ABNORMAL HIGH (ref 0.61–1.24)
GFR, Estimated: 55 mL/min — ABNORMAL LOW (ref >60)
Glucose, Bld: 132 mg/dL — ABNORMAL HIGH (ref 70–99)
Potassium: 4.9 mmol/L (ref 3.5–5.1)
Sodium: 148 mmol/L — ABNORMAL HIGH (ref 135–145)

## 2021-04-13 LAB — HEPATIC FUNCTION PANEL
ALT: 24 U/L (ref 0–44)
AST: 41 U/L (ref 15–41)
Albumin: 2 g/dL — ABNORMAL LOW (ref 3.5–5.0)
Alkaline Phosphatase: 42 U/L (ref 38–126)
Bilirubin, Direct: <0.1 mg/dL (ref 0.0–0.2)
Total Bilirubin: 0.2 mg/dL — ABNORMAL LOW (ref 0.3–1.2)
Total Protein: 6.9 g/dL (ref 6.5–8.1)

## 2021-04-13 LAB — GLUCOSE, CAPILLARY
Glucose-Capillary: 140 mg/dL — ABNORMAL HIGH (ref 70–99)
Glucose-Capillary: 124 mg/dL — ABNORMAL HIGH (ref 70–99)
Glucose-Capillary: 139 mg/dL — ABNORMAL HIGH (ref 70–99)
Glucose-Capillary: 132 mg/dL — ABNORMAL HIGH (ref 70–99)
Glucose-Capillary: 138 mg/dL — ABNORMAL HIGH (ref 70–99)

## 2021-04-13 LAB — CULTURE, RESPIRATORY W GRAM STAIN

## 2021-04-13 LAB — PHOSPHORUS: Phosphorus: 4.1 mg/dL (ref 2.5–4.6)

## 2021-04-13 LAB — PROCALCITONIN: Procalcitonin: 0.26 ng/mL

## 2021-04-13 LAB — MAGNESIUM: Magnesium: 2.6 mg/dL — ABNORMAL HIGH (ref 1.7–2.4)

## 2021-04-13 MED ORDER — VANCOMYCIN HCL 1250 MG/250ML IV SOLN
1250.0000 mg | INTRAVENOUS | Status: AC
  Administered 2021-04-14 – 2021-04-19 (×6): 1250 mg via INTRAVENOUS
  Filled 2021-04-13 (×6): qty 250

## 2021-04-13 MED ORDER — VANCOMYCIN HCL 2000 MG/400ML IV SOLN
2000.0000 mg | Freq: Once | INTRAVENOUS | Status: AC
  Administered 2021-04-13: 2000 mg via INTRAVENOUS
  Filled 2021-04-13: qty 400

## 2021-04-13 MED ORDER — FREE WATER
400.0000 mL | Freq: Four times a day (QID) | Status: DC
  Administered 2021-04-13 – 2021-04-19 (×22): 400 mL

## 2021-04-13 NOTE — Progress Notes (Signed)
NAME:  Billy Cherry, MRN:  [id number], DOB:  1970/12/16, LOS: 14 ADMISSION DATE:  03/30/2021, CONSULTATION DATE:  1/19 REFERRING MD:  Evette Georges, CHIEF COMPLAINT:  Found down, cardiac arrest   Brief Pt Description / Synopsis:  51 year old male with out-of-hospital V. fib cardiac arrest in setting of respiratory arrest due to suspected angioedema from ACE inhibitor leading to asphyxiation.  Concern on his 12 lead EKG for anterolateral ST elevation so he was taken to the cath lab where his study showed no significant coronary artery disease and an LVEF < 20%  Now withanoxic brain injury.  Pertinent  Medical History  Hypertension   Significant Hospital Events: Including procedures, antibiotic start and stop dates in addition to other pertinent events   1/19 admission, left heart cath without significant CAD.  Concern for anoxic brain injury 1/20: MRI pending, Neuro following 1/21: MRI shows areas of hypoxic-ischemic injury bilaterally. Fevered; cultures obtained. 1/23 severe brain damage 1/24 evidence of severe brain damage, remains critically ill 1/25: Plan for PEG today. Neuro exam unchanged.  Does exhibit new eyelid tremor like movements and lip/jaw tremor like vs clonus movement.  Bolused Keppra and increased maintenance dose.  Repeat EEG. CTH: concerning for progression of anoxic injury.  1/26: Repeat EEG unchanged and continues to show myoclonic seizures despite addition of Valproic acid yesterday. transfer to [facility] for continuous EEG 1/28: Continues to have myoclonic sz on EEG 1/29 No acute issues overnight 2/3 no meaningful neuro recovery. Diuresed 2/5 unchanged 2/6 New fever to 102, WBC 16K  Interim History / Subjective:  Remains comatose on vent. WBC going up.  Objective   Blood pressure (!) 140/96, pulse 94, temperature (!) 101.1 F (38.4 C), temperature source Axillary, resp. rate (!) 23, weight 99.2 kg, SpO2 98 %.    Vent Mode: PRVC FiO2 (%):  [30 %] 30 % Set Rate:   [20 bmp] 20 bmp Vt Set:  [600 mL] 600 mL PEEP:  [5 cmH20] 5 cmH20 Pressure Support:  [8 cmH20-10 cmH20] 8 cmH20 Plateau Pressure:  [16 cmH20-30 cmH20] 30 cmH20   Intake/Output Summary (Last 24 hours) at 04/13/2021 4818 Last data filed at 04/13/2021 5631 Gross per 24 hour  Intake 300 ml  Output 2200 ml  Net -1900 ml     Filed Weights   04/11/21 0500 04/12/21 0353 04/13/21 0500  Weight: 104 kg 99.2 kg 99.2 kg   Examination:  No distress +cough, corneal, doll's, gag, pupillary Opens eyes to pain No motor response to pain or voice otherwise Ext with no edema Abdomen soft Thick secretions  WBC improved Sputum MSSA and favobacterium odoratum (myroides)   Ancillary tests personally reviewed:    New fever of 102, Weaning,  Cr slightly down overall, No change in neuro status  Assessment & Plan:  OOH Vfib arrest Post arrest anoxic brain injury, severe Post arrest myoclonus improved with klonopin, no AEDs recommended Post arrest respiratory failure- vent dependent due to mental status and secretions Post arrest cardiomyopathy- on GDMT Post arrest AKI, improved/stable HTN- stable on current regimen HCAP w/ myroides, MSSA- vanc should cover both after discussion with PharmD  - Continue vent support, VAP prevention bundle - vanc x 7 days (end date 2/16) - Continue klonipin, if breakthrough myoclonus okay to go up on this - increase free water, if sodium continues to go up may need to work up for DI - family meeting tomorrow 10 AM to discuss trach/PEG/LTACH timing  Best Practice (right click and "Reselect all SmartList Selections" daily)  Diet/type: tubefeeds DVT prophylaxis: heparin SQ GI prophylaxis: PPI Lines: N/A Foley:  Yes, and it is still needed Code Status:  full code Last date of multidisciplinary goals of care discussion [tomorrow]   Patient critically ill due to resp failure Interventions to address this today vent titration, family discussions Risk of  deterioration without these interventions is high  I personally spent 38 minutes providing critical care not including any separately billable procedures  Myrla Halsted MD [place] [facility]  Prefer epic messenger for cross cover needs If after hours, please call E-link

## 2021-04-13 NOTE — Progress Notes (Signed)
Nutrition Follow-up  DOCUMENTATION CODES:   Not applicable  INTERVENTION:   Tube Feeding via Cortrak:  Vital 1.5 at 60 ml/h (1440 ml per day) Prosource TF to 45 ml TID Provides 2280 kcal, 130 gm protein, 1100 ml free water daily  Current free water flush of 400 mL q 6 hours plus TF provides total free water: 2700 mL   NUTRITION DIAGNOSIS:   Inadequate oral intake related to inability to eat as evidenced by NPO status.  Being addressed via TF   GOAL:   Patient will meet greater than or equal to 90% of their needs  Met via TF   MONITOR:   Vent status, Labs, Weight trends, TF tolerance, I & O's  REASON FOR ASSESSMENT:   Ventilator, Consult Enteral/tube feeding initiation and management  ASSESSMENT:   51 year old male who originally presented to [facility] on 1/19 after out-of-hospital cardiac arrest due to suspected angioedema from ACE inhibitor leading to asphyxiation. PMH of HTN, HLD.  1/19 Admitted to [facility] 1/26 Transferred to [facility] 1/27 Cortrak placed  Noted family meeting tomorrow to discuss trach/PEG/LTACH Pt remains on vent support, WBC trending up  Free water 400 mL q 6 hours; MD adjusting   No skin breakdown noted per RN skin assessment  Hypernatremic; sodium trending back up. UOP 2.2 L (0.9 mL/kg/hr)  Current wt 99 kg; net + per I/O flow sheet; weight down since admit  Labs: sodium 148 (H), Creatinine 1.53 Meds: ss novolog, imodium prn, nutrisource fiber  Diet Order:   Diet Order             Diet NPO time specified  Diet effective now                   EDUCATION NEEDS:   No education needs have been identified at this time  Skin:  Skin Assessment: Reviewed RN Assessment  Last BM:  2/9 type 6, rectal tube removed (2/1)  Height:   Ht Readings from Last 1 Encounters:  04/13/21 $RemoveB'5\' 10"'ScMxgirP$  (1.778 m)    Weight:   Wt Readings from Last 1 Encounters:  04/13/21 99.2 kg    Ideal Body Weight:  78.2 kg  BMI:  Body mass index is 31.38  kg/m.  Estimated Nutritional Needs:   Kcal:  2200-2400  Protein:  120-145 grams  Fluid:  >/= 2.2 L   Kerman Passey MS, RDN, LDN, CNSC Registered Dietitian III Clinical Nutrition RD Pager and On-Call Pager Number Located in [place]

## 2021-04-13 NOTE — Progress Notes (Signed)
Pharmacy Antibiotic Note  Billy Cherry is a 51 y.o. male admitted on 03/30/2021 Vfib arrest d/t angioedema from ACEi and now concern for hospital-acquired pneumonia. WBC 17.4, tmax 101.5. 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 Literature review indicates vancomycin and minocycline as additional agents with high sensitivity, up to 100%. Discussed with MD. Pharmacy has been consulted for vancomycin dosing.  Plan: Vancomycin 2000mg  x 1 load followed by vancomycin 1250mg  q24h (eAUC 467, Scr 1.53, Vd 0.5) Discontinue Cefepime 2g q8h F/u renal function, clinical status and length of therapy  Weight: 99.2 kg (218 lb 11.1 oz)  Temp (24hrs), Avg:100.2 F (37.9 C), Min:98.7 F (37.1 C), Max:101.5 F (38.6 C)  Recent Labs  Lab 04/09/21 0222 04/10/21 0458 04/11/21 0307 04/12/21 0251 04/13/21 0210  WBC 15.8* 16.1* 17.7* 21.4* 17.4*  CREATININE 1.62* 1.48* 1.56* 1.42* 1.53*     Estimated Creatinine Clearance: 69.4 mL/min (A) (by C-G formula based on SCr of 1.53 mg/dL (H)).    Allergies  Allergen Reactions   Lisinopril Swelling    angioedema    Antimicrobials this admission: CTX 1/21 >> 1/25 Cefepime 1/31 >> 2/2 >2/9 Ancef 2/2 >> 2/6 Vancomycin 2/9>>  Dose adjustments this admission:  Microbiology results: 1/31 TA - MSSA and Corynebacterium  2/6 Bcx NGTD 2/6 resp cx: flavobacterium odoratum (S cipro, bactrim, pip/tazo)  Thank you for allowing pharmacy to be a part of this patients care.  2/31 04/13/2021 8:31 AM

## 2021-04-13 NOTE — Care Management (Signed)
°  Transition of Care [name]) Screening Note   Patient Details  Name: Billy Cherry Date of Birth: 12-Oct-1970   Transition of Care [facility]) CM/SW Contact:    Gala Lewandowsky, RN Phone Number: 04/13/2021, 4:56 PM    Transition of Care Department [facility]) has reviewed the patient. Palliative Care is following the patient and speaking with the spouse. Case Manager will continue to follow as well for additional assistance needed.

## 2021-04-13 NOTE — Plan of Care (Signed)

## 2021-04-14 ENCOUNTER — Inpatient Hospital Stay (HOSPITAL_COMMUNITY): Payer: BC Managed Care – PPO

## 2021-04-14 LAB — HEPATIC FUNCTION PANEL
ALT: 21 U/L (ref 0–44)
AST: 36 U/L (ref 15–41)
Albumin: 1.9 g/dL — ABNORMAL LOW (ref 3.5–5.0)
Alkaline Phosphatase: 44 U/L (ref 38–126)
Bilirubin, Direct: <0.1 mg/dL (ref 0.0–0.2)
Total Bilirubin: 0.6 mg/dL (ref 0.3–1.2)
Total Protein: 7.1 g/dL (ref 6.5–8.1)

## 2021-04-14 LAB — BASIC METABOLIC PANEL
Anion gap: 11 (ref 5–15)
BUN: 60 mg/dL — ABNORMAL HIGH (ref 6–20)
CO2: 26 mmol/L (ref 22–32)
Calcium: 8.5 mg/dL — ABNORMAL LOW (ref 8.9–10.3)
Chloride: 108 mmol/L (ref 98–111)
Creatinine, Ser: 1.44 mg/dL — ABNORMAL HIGH (ref 0.61–1.24)
GFR, Estimated: 59 mL/min — ABNORMAL LOW (ref >60)
Glucose, Bld: 134 mg/dL — ABNORMAL HIGH (ref 70–99)
Potassium: 4.6 mmol/L (ref 3.5–5.1)
Sodium: 145 mmol/L (ref 135–145)

## 2021-04-14 LAB — PHOSPHORUS: Phosphorus: 3.7 mg/dL (ref 2.5–4.6)

## 2021-04-14 LAB — CBC
HCT: 32.9 % — ABNORMAL LOW (ref 39.0–52.0)
Hemoglobin: 10.5 g/dL — ABNORMAL LOW (ref 13.0–17.0)
MCH: 30.5 pg (ref 26.0–34.0)
MCHC: 31.9 g/dL (ref 30.0–36.0)
MCV: 95.6 fL (ref 80.0–100.0)
Platelets: 325 10*3/uL (ref 150–400)
RBC: 3.44 MIL/uL — ABNORMAL LOW (ref 4.22–5.81)
RDW: 13.9 % (ref 11.5–15.5)
WBC: 16.7 10*3/uL — ABNORMAL HIGH (ref 4.0–10.5)
nRBC: 0 % (ref 0.0–0.2)

## 2021-04-14 LAB — GLUCOSE, CAPILLARY
Glucose-Capillary: 106 mg/dL — ABNORMAL HIGH (ref 70–99)
Glucose-Capillary: 114 mg/dL — ABNORMAL HIGH (ref 70–99)
Glucose-Capillary: 119 mg/dL — ABNORMAL HIGH (ref 70–99)
Glucose-Capillary: 120 mg/dL — ABNORMAL HIGH (ref 70–99)
Glucose-Capillary: 126 mg/dL — ABNORMAL HIGH (ref 70–99)
Glucose-Capillary: 129 mg/dL — ABNORMAL HIGH (ref 70–99)
Glucose-Capillary: 148 mg/dL — ABNORMAL HIGH (ref 70–99)

## 2021-04-14 LAB — MAGNESIUM: Magnesium: 2.5 mg/dL — ABNORMAL HIGH (ref 1.7–2.4)

## 2021-04-14 LAB — PROCALCITONIN: Procalcitonin: 0.25 ng/mL

## 2021-04-14 MED ORDER — MIDAZOLAM HCL 2 MG/2ML IJ SOLN
5.0000 mg | Freq: Once | INTRAMUSCULAR | Status: AC
  Administered 2021-04-15: 5 mg via INTRAVENOUS
  Filled 2021-04-14: qty 6

## 2021-04-14 MED ORDER — FENTANYL CITRATE PF 50 MCG/ML IJ SOSY
200.0000 ug | PREFILLED_SYRINGE | Freq: Once | INTRAMUSCULAR | Status: AC
  Administered 2021-04-15: 200 ug via INTRAVENOUS
  Filled 2021-04-14: qty 4

## 2021-04-14 MED ORDER — VECURONIUM BROMIDE 10 MG IV SOLR
10.0000 mg | Freq: Once | INTRAVENOUS | Status: AC
  Administered 2021-04-15: 10 mg via INTRAVENOUS
  Filled 2021-04-14: qty 10

## 2021-04-14 MED ORDER — ALBUMIN HUMAN 5 % IV SOLN
INTRAVENOUS | Status: AC
  Filled 2021-04-14: qty 500

## 2021-04-14 NOTE — TOC Initial Note (Signed)
Transition of Care [facility]) - Initial/Assessment Note    Patient Details  Name: Billy Cherry MRN: [id number] Date of Birth: [birth year]  Transition of Care [facility]) CM/SW Contact:    Gala Lewandowsky, RN Phone Number: 04/14/2021, 11:19 AM  Clinical Narrative: Risk for readmission assessment completed. Case Manager spoke with spouse and mother of the patient this morning. Per spouse, PTA and previous hospital stay patient was independent from home. Patient was working and they live in [place] in a single family home with 15 steps to the bedroom. PCP is at [facility] unable to provide the name of the provider. [facility] in [place]. Spouse states that the family wants to proceed with trach/peg tube. Case Manager did discuss long term options of possible LTAC. Case Manager explained the options of [facility] if insurance would approve. Patient will need a PT/OT consult for recommendations. Palliative Care is following the patient as well. Case Manager will continue to follow for additional transition of care needs.   Expected Discharge Plan: Long Term Acute Care (LTAC) Barriers to Discharge: Continued Medical Work up   Patient Goals and CMS Choice Patient states their goals for this hospitalization and ongoing recovery are:: Patient intubated and sedated plan for trach-peg tube      Expected Discharge Plan and Services Expected Discharge Plan: Long Term Acute Care (LTAC) In-house Referral: Hospice / Palliative Care Discharge Planning Services: CM Consult Post Acute Care Choice: Long Term Acute Care (LTAC) Living arrangements for the past 2 months: Single Family Home                 DME Arranged: N/A     Prior Living Arrangements/Services Living arrangements for the past 2 months: Single Family Home Lives with:: Spouse Patient language and need for interpreter reviewed:: Yes        Need for Family Participation in Patient Care:  Yes (Comment) Care giver support system in place?: Yes (comment) Current home services:  (Patient was independent from home.) Criminal Activity/Legal Involvement Pertinent to Current Situation/Hospitalization: No - Comment as needed  Activities of Daily Living Home Assistive Devices/Equipment: None ADL Screening (condition at time of admission) Patient's cognitive ability adequate to safely complete daily activities?: Yes Is the patient deaf or have difficulty hearing?: No Does the patient have difficulty seeing, even when wearing glasses/contacts?: No Does the patient have difficulty concentrating, remembering, or making decisions?: No Patient able to express need for assistance with ADLs?: Yes Does the patient have difficulty dressing or bathing?: No Independently performs ADLs?: No Communication: Dependent Is this a change from baseline?: Change from baseline, expected to last <3 days Dressing (OT): Dependent Is this a change from baseline?: Change from baseline, expected to last <3days Grooming: Dependent Is this a change from baseline?: Change from baseline, expected to last <3 days Feeding: Dependent Is this a change from baseline?: Change from baseline, expected to last <3 days Bathing: Dependent Is this a change from baseline?: Change from baseline, expected to last <3 days Toileting: Dependent Is this a change from baseline?: Change from baseline, expected to last <3 days In/Out Bed: Dependent Is this a change from baseline?: Change from baseline, expected to last <3 days Does the patient have difficulty walking or climbing stairs?: No Weakness of Legs: Both Weakness of Arms/Hands: Both  Permission Sought/Granted Permission sought to share information with : Family Supports, Magazine features editor, Case Manager   Emotional Assessment Appearance:: Appears stated age Attitude/Demeanor/Rapport: Unable to Assess Affect (  typically observed): Unable to Assess    Alcohol / Substance Use: Not Applicable Psych Involvement: No (comment)  Admission diagnosis:  Cardiac arrest [facility]) [I46.9] Patient Active Problem List   Diagnosis Date Noted   HFrEF (heart failure with reduced ejection fraction) ([name]) 04/05/2021   Hypernatremia 04/04/2021   Anoxic encephalopathy ([name]) 04/03/2021   Myoclonus 03/30/2021   AKI (acute kidney injury) ([name])    Acute respiratory failure ([name])    Cardiac arrest ([name]) 03/23/2021   PCP:  Patient, No Pcp Per (Inactive) Pharmacy:   [facility] [address] [place] [facility] [address] [place] [address] Phone: [phone] Fax: [phone]   Readmission Risk Interventions Readmission Risk Prevention Plan 04/14/2021  Transportation Screening Complete  HRI or Home Care Consult Complete  Social Work Consult for Recovery Care Planning/Counseling Complete  Palliative Care Screening Complete  Medication Review Oceanographer) Complete  Some recent data might be hidden

## 2021-04-14 NOTE — Progress Notes (Signed)
NAME:  Ardell Hegge, MRN:  [id number], DOB:  [birth year], LOS: 40 ADMISSION DATE:  03/30/2021, CONSULTATION DATE:  1/19 REFERRING MD:  Lorinda Creed, CHIEF COMPLAINT:  Found down, cardiac arrest   Brief Pt Description / Synopsis:  51 year old male with out-of-hospital V. fib cardiac arrest in setting of respiratory arrest due to suspected angioedema from ACE inhibitor leading to asphyxiation.  Concern on his 12 lead EKG for anterolateral ST elevation so he was taken to the cath lab where his study showed no significant coronary artery disease and an LVEF < 20%  Now withanoxic brain injury.  Pertinent  Medical History  Hypertension   Significant Hospital Events: Including procedures, antibiotic start and stop dates in addition to other pertinent events   1/19 admission, left heart cath without significant CAD.  Concern for anoxic brain injury 1/20: MRI pending, Neuro following 1/21: MRI shows areas of hypoxic-ischemic injury bilaterally. Fevered; cultures obtained. 1/23 severe brain damage 1/24 evidence of severe brain damage, remains critically ill 1/25: Plan for PEG today. Neuro exam unchanged.  Does exhibit new eyelid tremor like movements and lip/jaw tremor like vs clonus movement.  Bolused Keppra and increased maintenance dose.  Repeat EEG. CTH: concerning for progression of anoxic injury.  1/26: Repeat EEG unchanged and continues to show myoclonic seizures despite addition of Valproic acid yesterday. transfer to [facility] for continuous EEG 1/28: Continues to have myoclonic sz on EEG 1/29 No acute issues overnight 2/3 no meaningful neuro recovery. Diuresed 2/5 unchanged 2/6 New fever to 102, WBC 16K  Interim History / Subjective:  Neurologically unchanged.   Objective   Blood pressure (!) 137/98, pulse 89, temperature (!) 101.2 F (38.4 C), temperature source Axillary, resp. rate (!) 25, height 5\' 10"  (1.778 m), weight 100 kg, SpO2 97 %.    Vent Mode: PSV;CPAP FiO2 (%):  [30 %] 30  % Set Rate:  [20 bmp] 20 bmp Vt Set:  [600 mL] 600 mL PEEP:  [5 cmH20] 5 cmH20 Pressure Support:  [12 cmH20] 12 cmH20 Plateau Pressure:  [16 cmH20-18 cmH20] 18 cmH20   Intake/Output Summary (Last 24 hours) at 04/14/2021 1254 Last data filed at 04/14/2021 0800 Gross per 24 hour  Intake 1069 ml  Output 1100 ml  Net -31 ml     Filed Weights   04/13/21 0500 04/13/21 1034 04/14/21 0423  Weight: 99.2 kg 99.2 kg 100 kg   Examination:  No distress +cough, corneal, doll's, gag, pupillary Opens eyes to pain No motor response to pain or voice otherwise Ext with no edema Abdomen soft Thick secretions  WBC improved Sputum MSSA and favobacterium odoratum (myroides)   Ancillary tests personally reviewed:    Creatinine stable at 1.44 WBC decreased to 16.7  Assessment & Plan:  OOH Vfib arrest Post arrest anoxic brain injury, severe Post arrest myoclonus improved with klonopin, no AEDs recommended Post arrest respiratory failure- vent dependent due to mental status and secretions Post arrest cardiomyopathy- on GDMT Post arrest AKI, improved/stable HTN- stable on current regimen HCAP w/ myroides, MSSA- vanc should cover both after discussion with PharmD  Plan:   - Continue vent support, VAP prevention bundle - vanc x 7 days (end date 2/16) - Continue klonipin, if breakthrough myoclonus okay to go up on this - increase free water, if sodium continues to go up may need to work up for [employer] to wife today and have consent for tracheostomy tomorrow and permission to proceed with PEG tube.   Best Practice (right click and "Reselect  all SmartList Selections" daily)   Diet/type: tubefeeds DVT prophylaxis: heparin SQ GI prophylaxis: PPI Lines: N/A Foley:  Yes, and it is still needed Code Status:  full code Last date of multidisciplinary goals of care discussion [tomorrow]  Kipp Brood, MD [facility] ICU Physician [place]  Pager: [phone] Or Epic Secure  Chat After hours: [phone].  04/14/2021, 12:56 PM

## 2021-04-14 NOTE — Progress Notes (Signed)
° °  Palliative Medicine Inpatient Follow Up Note  HPI:  Per progress note(s)-->  51 year old male with out-of-hospital V. fib cardiac arrest in setting of respiratory arrest due to suspected angioedema from ACE inhibitor leading to asphyxiation. Cardiac catheterization without evidence of significant coronary artery disease. Now with concern for anoxic brain injury.   Palliative care has been asked to get involved to discuss goals of care in the setting of a cardiac arrest.  Today's Discussion (04/14/2021):  *Please note that this is a verbal dictation therefore any spelling or grammatical errors are due to the "[place]" system interpretation.  Chart reviewed inclusive of vital signs, progress notes, laboratory results, and diagnostic images.   I met with patient's spouse, Ulyess Mort and mother Charleston Ropes at bedside this morning. There is a notable distance whereby family does not seem interested in discussing his overall poor condition.  Ulyess Mort expresses that the decision has been made to pursue tracheostomy and PEG placement. Discussed that this has been reviewed among family and this is the decision that Ulyess Mort feels is best.   Patients mother, Charleston Ropes supports Rea's decision.   Denies questions and concerns for PMT.  Palliative Support Provided - discussed that we will continue to be present as a source of support in the background.  Objective Assessment: Vital Signs Vitals:   04/14/21 0900 04/14/21 1100  BP: (!) 137/98   Pulse: 89   Resp: (!) 25   Temp:  (!) 101.2 F (38.4 C)  SpO2: 97%     Intake/Output Summary (Last 24 hours) at 04/14/2021 1417 Last data filed at 04/14/2021 0800 Gross per 24 hour  Intake 740 ml  Output 800 ml  Net -60 ml    Last Weight  Most recent update: 04/14/2021  4:23 AM    Weight  100 kg (220 lb 7.4 oz)            Gen:  Middle aged [place] M intubated HEENT: Intubated, coretrack, drymucous membranes CV: Regular rate and rhythm  PULM: Mechanical  ventilator ABD: soft/nontender  EXT: No edema  Neuro: Somnolent, not following any commands  SUMMARY OF RECOMMENDATIONS   Full Code / Full Scope of Care   Plan for Trach and PEG   Palliative support will be peripheral at this point  MDM - 4  Medical Decision Making:4 #/Complex Problems: 4                     Data Reviewed:    4             Management: 4 (1-Straightforward, 2-Low, 3-Moderate, 4-High) ______________________________________________________________________________________ [place] Team Team Cell Phone: [phone] Please utilize secure chat with additional questions, if there is no response within 30 minutes please call the above phone number  Palliative Medicine Team providers are available by phone from 7am to 7pm daily and can be reached through the team cell phone.  Should this patient require assistance outside of these hours, please call the patient's attending physician.

## 2021-04-15 ENCOUNTER — Inpatient Hospital Stay (HOSPITAL_COMMUNITY): Payer: BC Managed Care – PPO

## 2021-04-15 LAB — BASIC METABOLIC PANEL
Anion gap: 12 (ref 5–15)
BUN: 64 mg/dL — ABNORMAL HIGH (ref 6–20)
CO2: 27 mmol/L (ref 22–32)
Calcium: 8.5 mg/dL — ABNORMAL LOW (ref 8.9–10.3)
Chloride: 109 mmol/L (ref 98–111)
Creatinine, Ser: 1.45 mg/dL — ABNORMAL HIGH (ref 0.61–1.24)
GFR, Estimated: 59 mL/min — ABNORMAL LOW (ref >60)
Glucose, Bld: 101 mg/dL — ABNORMAL HIGH (ref 70–99)
Potassium: 5 mmol/L (ref 3.5–5.1)
Sodium: 148 mmol/L — ABNORMAL HIGH (ref 135–145)

## 2021-04-15 LAB — CULTURE, BLOOD (ROUTINE X 2)
Culture: NO GROWTH
Culture: NO GROWTH
Special Requests: ADEQUATE
Special Requests: ADEQUATE

## 2021-04-15 LAB — CBC
HCT: 32.4 % — ABNORMAL LOW (ref 39.0–52.0)
Hemoglobin: 10 g/dL — ABNORMAL LOW (ref 13.0–17.0)
MCH: 29.9 pg (ref 26.0–34.0)
MCHC: 30.9 g/dL (ref 30.0–36.0)
MCV: 96.7 fL (ref 80.0–100.0)
Platelets: 309 10*3/uL (ref 150–400)
RBC: 3.35 MIL/uL — ABNORMAL LOW (ref 4.22–5.81)
RDW: 14 % (ref 11.5–15.5)
WBC: 11.7 10*3/uL — ABNORMAL HIGH (ref 4.0–10.5)
nRBC: 0 % (ref 0.0–0.2)

## 2021-04-15 LAB — MAGNESIUM: Magnesium: 2.7 mg/dL — ABNORMAL HIGH (ref 1.7–2.4)

## 2021-04-15 LAB — HEPATIC FUNCTION PANEL
ALT: 20 U/L (ref 0–44)
AST: 35 U/L (ref 15–41)
Albumin: 2 g/dL — ABNORMAL LOW (ref 3.5–5.0)
Alkaline Phosphatase: 41 U/L (ref 38–126)
Bilirubin, Direct: <0.1 mg/dL (ref 0.0–0.2)
Total Bilirubin: 0.3 mg/dL (ref 0.3–1.2)
Total Protein: 7.1 g/dL (ref 6.5–8.1)

## 2021-04-15 LAB — GLUCOSE, CAPILLARY
Glucose-Capillary: 104 mg/dL — ABNORMAL HIGH (ref 70–99)
Glucose-Capillary: 104 mg/dL — ABNORMAL HIGH (ref 70–99)
Glucose-Capillary: 103 mg/dL — ABNORMAL HIGH (ref 70–99)
Glucose-Capillary: 85 mg/dL (ref 70–99)
Glucose-Capillary: 121 mg/dL — ABNORMAL HIGH (ref 70–99)

## 2021-04-15 LAB — VALPROIC ACID LEVEL: Valproic Acid Lvl: 40 ug/mL — ABNORMAL LOW (ref 50.0–100.0)

## 2021-04-15 LAB — PHOSPHORUS: Phosphorus: 4.4 mg/dL (ref 2.5–4.6)

## 2021-04-15 MED ORDER — MIDAZOLAM HCL 2 MG/2ML IJ SOLN
2.0000 mg | Freq: Once | INTRAMUSCULAR | Status: AC
  Administered 2021-04-15: 2 mg via INTRAVENOUS

## 2021-04-15 MED ORDER — GERHARDT'S BUTT CREAM
TOPICAL_CREAM | Freq: Every day | CUTANEOUS | Status: DC
  Administered 2021-04-17 – 2021-10-03 (×9): 1 via TOPICAL
  Filled 2021-04-15 (×6): qty 1

## 2021-04-15 MED ORDER — MIDAZOLAM HCL 2 MG/2ML IJ SOLN
INTRAMUSCULAR | Status: AC
  Filled 2021-04-15: qty 2

## 2021-04-15 MED ORDER — FENTANYL CITRATE PF 50 MCG/ML IJ SOSY
100.0000 ug | PREFILLED_SYRINGE | Freq: Once | INTRAMUSCULAR | Status: AC
  Administered 2021-04-15: 100 ug via INTRAVENOUS

## 2021-04-15 NOTE — Procedures (Signed)
Diagnostic Bronchoscopy  Joffrey Kerce  [id number]  [birth year]  Date:04/15/21  Time:2:01 PM   Provider Performing:Annaelle Kasel Humphrey Rolls   Procedure: Diagnostic Bronchoscopy (81017)  Indication(s) Assist with direct visualization of tracheostomy placement  Consent Risks of the procedure as well as the alternatives and risks of each were explained to the patient and/or caregiver.  Consent for the procedure was obtained.   Anesthesia See separate tracheostomy note   Time Out Verified patient identification, verified procedure, site/side was marked, verified correct patient position, special equipment/implants available, medications/allergies/relevant history reviewed, required imaging and test results available.   Sterile Technique Usual hand hygiene, masks, gowns, and gloves were used   Procedure Description Bronchoscope advanced through endotracheal tube and into airway.  After suctioning out tracheal secretions, bronchoscope used to provide direct visualization of tracheostomy placement.   Complications/Tolerance None; patient tolerated the procedure well.   EBL None  Specimen(s) None  Durel Salts, MD Pulmonary and Critical Care Medicine [facility] 04/15/2021 2:01 PM Pager: see AMION  If no response to pager, please call critical care on call (see AMION) until 7pm After 7:00 pm call Elink

## 2021-04-15 NOTE — Progress Notes (Signed)
NAME:  Billy Cherry, MRN:  [id number], DOB:  1970-12-19, LOS: 61 ADMISSION DATE:  03/30/2021, CONSULTATION DATE:  1/19 REFERRING MD:  Lorinda Creed, CHIEF COMPLAINT:  Found down, cardiac arrest   Brief Pt Description / Synopsis:  51 year old male with out-of-hospital V. fib cardiac arrest in setting of respiratory arrest due to suspected angioedema from ACE inhibitor leading to asphyxiation.  Concern on his 12 lead EKG for anterolateral ST elevation so he was taken to the cath lab where his study showed no significant coronary artery disease and an LVEF < 20%  Now withanoxic brain injury.  Pertinent  Medical History  Hypertension   Significant Hospital Events: Including procedures, antibiotic start and stop dates in addition to other pertinent events   1/19 admission, left heart cath without significant CAD.  Concern for anoxic brain injury 1/20: MRI pending, Neuro following 1/21: MRI shows areas of hypoxic-ischemic injury bilaterally. Fevered; cultures obtained. 1/23 severe brain damage 1/24 evidence of severe brain damage, remains critically ill 1/25: Plan for PEG today. Neuro exam unchanged.  Does exhibit new eyelid tremor like movements and lip/jaw tremor like vs clonus movement.  Bolused Keppra and increased maintenance dose.  Repeat EEG. CTH: concerning for progression of anoxic injury.  1/26: Repeat EEG unchanged and continues to show myoclonic seizures despite addition of Valproic acid yesterday. transfer to [facility] for continuous EEG 1/28: Continues to have myoclonic sz on EEG 1/29 No acute issues overnight 2/3 no meaningful neuro recovery. Diuresed 2/5 unchanged 2/6 New fever to 102, WBC 16K  Interim History / Subjective:  Neurologically unchanged.   Objective   Blood pressure (!) 158/115, pulse 83, temperature 99.3 F (37.4 C), temperature source Oral, resp. rate 20, height 5\' 10"  (1.778 m), weight 100 kg, SpO2 99 %.    Vent Mode: PRVC FiO2 (%):  [30 %-100 %] 100 % Set  Rate:  [20 bmp] 20 bmp Vt Set:  [600 mL] 600 mL PEEP:  [5 cmH20] 5 cmH20 Plateau Pressure:  [17 cmH20-21 cmH20] 17 cmH20   Intake/Output Summary (Last 24 hours) at 04/15/2021 1414 Last data filed at 04/15/2021 0800 Gross per 24 hour  Intake 650 ml  Output 1325 ml  Net -675 ml     Filed Weights   04/13/21 0500 04/13/21 1034 04/14/21 0423  Weight: 99.2 kg 99.2 kg 100 kg   Examination:  No distress +cough, corneal, doll's, gag, pupillary Opens eyes to pain No motor response to pain or voice otherwise Ext with no edema Abdomen soft Thick secretions  WBC improved Sputum MSSA and favobacterium odoratum (myroides)   Ancillary tests personally reviewed:    Creatinine stable at 1.45 WBC decreased to 11.7 Na: 148 Assessment & Plan:  OOH Vfib arrest Post arrest anoxic brain injury, severe Post arrest myoclonus improved with klonopin, no AEDs recommended Post arrest respiratory failure- vent dependent due to mental status and secretions Post arrest cardiomyopathy- on GDMT Post arrest AKI, improved/stable HTN- stable on current regimen HCAP w/ myroides, MSSA- vanc should cover both after discussion with PharmD  Plan:   - Tracheostomy today, then proceed to trach collar trials - IR to see for PEG - vanc x 7 days (end date 2/16) - Continue klonipin, if breakthrough myoclonus okay to go up on this   Best Practice (right click and "Reselect all SmartList Selections" daily)   Diet/type: tubefeeds DVT prophylaxis: heparin SQ GI prophylaxis: PPI Lines: N/A Foley:  Yes, and it is still needed Code Status:  full code Last date of multidisciplinary  goals of care discussion [1/10]  Kipp Brood, MD [facility] ICU Physician [place]  Pager: [phone] Or Epic Secure Chat After hours: [phone].  04/15/2021, 2:14 PM

## 2021-04-15 NOTE — Progress Notes (Signed)
Speech Language Pathology Treatment:    Patient Details Name: Billy Cherry MRN: [id number] DOB: [birth year] Today's Date: 04/15/2021 Time:  -     Received orders for PMV and swallow. Trach placed today and ST will plan to assess tomorrow schedule allowing.                            Houston Siren  04/15/2021, 2:39 PM

## 2021-04-15 NOTE — Procedures (Signed)
Percutaneous Tracheostomy Procedure Note   Billy Cherry  [id number]  10-26-1970  Date:04/15/21  Time:2:18 PM   Provider Performing:James Lafalce  Procedure: Percutaneous Tracheostomy with Bronchoscopic Guidance (00349)  Indication(s) Prolonged mechanical ventilation and airway protection.   Consent Risks of the procedure as well as the alternatives and risks of each were explained to the patient and/or caregiver.  Consent for the procedure was obtained.  Anesthesia  Versed, Fentanyl, Vecuronium   Time Out Verified patient identification, verified procedure, site/side was marked, verified correct patient position, special equipment/implants available, medications/allergies/relevant history reviewed, required imaging and test results available.   Sterile Technique Maximal sterile technique including sterile barrier drape, hand hygiene, sterile gown, sterile gloves, mask, hair covering.    Procedure Description Appropriate anatomy identified by palpation.  Patient's neck prepped and draped in sterile fashion.  1% lidocaine with epinephrine was used to anesthetize skin overlying neck.  1.5cm incision made and blunt dissection performed until tracheal rings could be easily palpated.   Then a size 6 Shiley tracheostomy was placed under bronchoscopic visualization using usual Seldinger technique and serial dilation.   Bronchoscope confirmed placement above the carina.  Tracheostomy was sutured in place with adhesive pad to protect skin under pressure.    Patient connected to ventilator.   Complications/Tolerance None; patient tolerated the procedure well. Chest X-ray is ordered to confirm no post-procedural complication.   EBL Minimal   Specimen(s) None   Lynnell Catalan, MD [facility] ICU Physician [facility] [place] [facility]  Pager: [phone] Or Epic Secure Chat After hours: [phone].  04/15/2021, 2:19 PM

## 2021-04-15 NOTE — Progress Notes (Signed)
IR aware of request for percutaneous G-tube placement - patient history and imaging reviewed by Dr. Bryn Gulling who approves patient for procedure/  Plan for G-tube placement likely mid week of 2/13 as schedule allows - patient will require PO contrast night prior. IR APP will see for consult/consent early next week.  Please call with questions or concerns.  Werner Lean

## 2021-04-16 LAB — CBC
HCT: 34.2 % — ABNORMAL LOW (ref 39.0–52.0)
Hemoglobin: 10.6 g/dL — ABNORMAL LOW (ref 13.0–17.0)
MCH: 29.8 pg (ref 26.0–34.0)
MCHC: 31 g/dL (ref 30.0–36.0)
MCV: 96.1 fL (ref 80.0–100.0)
Platelets: 283 10*3/uL (ref 150–400)
RBC: 3.56 MIL/uL — ABNORMAL LOW (ref 4.22–5.81)
RDW: 13.8 % (ref 11.5–15.5)
WBC: 14 10*3/uL — ABNORMAL HIGH (ref 4.0–10.5)
nRBC: 0 % (ref 0.0–0.2)

## 2021-04-16 LAB — PHOSPHORUS: Phosphorus: 5.3 mg/dL — ABNORMAL HIGH (ref 2.5–4.6)

## 2021-04-16 LAB — GLUCOSE, CAPILLARY
Glucose-Capillary: 127 mg/dL — ABNORMAL HIGH (ref 70–99)
Glucose-Capillary: 130 mg/dL — ABNORMAL HIGH (ref 70–99)
Glucose-Capillary: 133 mg/dL — ABNORMAL HIGH (ref 70–99)
Glucose-Capillary: 135 mg/dL — ABNORMAL HIGH (ref 70–99)
Glucose-Capillary: 139 mg/dL — ABNORMAL HIGH (ref 70–99)
Glucose-Capillary: 141 mg/dL — ABNORMAL HIGH (ref 70–99)

## 2021-04-16 LAB — BASIC METABOLIC PANEL
Anion gap: 12 (ref 5–15)
BUN: 71 mg/dL — ABNORMAL HIGH (ref 6–20)
CO2: 26 mmol/L (ref 22–32)
Calcium: 8.7 mg/dL — ABNORMAL LOW (ref 8.9–10.3)
Chloride: 112 mmol/L — ABNORMAL HIGH (ref 98–111)
Creatinine, Ser: 1.53 mg/dL — ABNORMAL HIGH (ref 0.61–1.24)
GFR, Estimated: 55 mL/min — ABNORMAL LOW (ref >60)
Glucose, Bld: 138 mg/dL — ABNORMAL HIGH (ref 70–99)
Potassium: 4.1 mmol/L (ref 3.5–5.1)
Sodium: 150 mmol/L — ABNORMAL HIGH (ref 135–145)

## 2021-04-16 LAB — HEPATIC FUNCTION PANEL
ALT: 23 U/L (ref 0–44)
AST: 39 U/L (ref 15–41)
Albumin: 2 g/dL — ABNORMAL LOW (ref 3.5–5.0)
Alkaline Phosphatase: 41 U/L (ref 38–126)
Bilirubin, Direct: <0.1 mg/dL (ref 0.0–0.2)
Total Bilirubin: 0.2 mg/dL — ABNORMAL LOW (ref 0.3–1.2)
Total Protein: 7.1 g/dL (ref 6.5–8.1)

## 2021-04-16 LAB — VANCOMYCIN, PEAK: Vancomycin Pk: 26 ug/mL — ABNORMAL LOW (ref 30–40)

## 2021-04-16 LAB — MAGNESIUM: Magnesium: 2.6 mg/dL — ABNORMAL HIGH (ref 1.7–2.4)

## 2021-04-16 NOTE — Progress Notes (Addendum)
NAME:  Billy Cherry, MRN:  [id number], DOB:  1970-03-12, LOS: 44 ADMISSION DATE:  03/30/2021, CONSULTATION DATE:  1/19 REFERRING MD:  Lorinda Creed, CHIEF COMPLAINT:  Found down, cardiac arrest   Brief Pt Description / Synopsis:  51 year old male with out-of-hospital V. fib cardiac arrest in setting of respiratory arrest due to suspected angioedema from ACE inhibitor leading to asphyxiation.  Concern on his 12 lead EKG for anterolateral ST elevation so he was taken to the cath lab where his study showed no significant coronary artery disease and an LVEF < 20%  Now withanoxic brain injury.  Pertinent  Medical History  Hypertension  Significant Hospital Events: Including procedures, antibiotic start and stop dates in addition to other pertinent events   1/19 admission, left heart cath without significant CAD.  Concern for anoxic brain injury 1/20: MRI pending, Neuro following 1/21: MRI shows areas of hypoxic-ischemic injury bilaterally. Fevered; cultures obtained. 1/23 severe brain damage 1/24 evidence of severe brain damage, remains critically ill 1/25: Plan for PEG today. Neuro exam unchanged.  Does exhibit new eyelid tremor like movements and lip/jaw tremor like vs clonus movement.  Bolused Keppra and increased maintenance dose.  Repeat EEG. CTH: concerning for progression of anoxic injury.  1/26: Repeat EEG unchanged and continues to show myoclonic seizures despite addition of Valproic acid yesterday. transfer to [facility] for continuous EEG 1/28: Continues to have myoclonic sz on EEG 1/29 No acute issues overnight 2/3 no meaningful neuro recovery. Diuresed 2/5 unchanged 2/6 New fever to 102, WBC 16K - treated with vancomycin for 7d based on cultures.  2/11: percutaneous tracheostomy  Interim History / Subjective:  Neurologically unchanged.  For PEG tube this week  Objective   Blood pressure (!) 152/96, pulse 89, temperature 99.5 F (37.5 C), temperature source Axillary, resp. rate (!)  22, height 5\' 10"  (1.778 m), weight 100 kg, SpO2 99 %.    Vent Mode: PSV;CPAP FiO2 (%):  [30 %-100 %] 30 % Set Rate:  [20 bmp] 20 bmp Vt Set:  [600 mL] 600 mL PEEP:  [5 cmH20] 5 cmH20 Pressure Support:  [8 cmH20] 8 cmH20 Plateau Pressure:  [17 cmH20-25 cmH20] 18 cmH20   Intake/Output Summary (Last 24 hours) at 04/16/2021 0925 Last data filed at 04/16/2021 0400 Gross per 24 hour  Intake 2021.81 ml  Output 1800 ml  Net 221.81 ml     Filed Weights   04/13/21 0500 04/13/21 1034 04/14/21 0423  Weight: 99.2 kg 99.2 kg 100 kg   Examination:  General: on trach collar in no distress HEENT: tracheostomy site intact Chest: strong cough with tan secretions CV: HS normal, hypertensive Abdo: soft.  Extremities: no edema.  Neuro: eyes open, does not track or follow commands.   Ancillary tests personally reviewed:    Creatinine 1.24 WBC 18.4 Na: 148 Sputum MSSA and favobacterium odoratum (myroides) Assessment & Plan:  OOH Vfib arrest Post arrest anoxic brain injury, severe Post arrest myoclonus improved with klonopin, no AEDs recommended Post arrest respiratory failure- vent dependent due to mental status and secretions Post arrest cardiomyopathy- on GDMT Post arrest AKI, improved/stable HTN- stable on current regimen HCAP w/ MSSA- vanc should cover both after discussion with PharmD  Plan:   - Tracheostomy today, then proceed to trach collar trials - IR to see for PEG - vanc x 7 days (end date 2/16) - Continue klonipin, if breakthrough myoclonus okay to go up on this - Chest PT - added amlodipine for hypertension.    Best Practice (right click  and "Reselect all SmartList Selections" daily)   Diet/type: tubefeeds DVT prophylaxis: heparin SQ GI prophylaxis: PPI Lines: N/A Foley:  Yes, and it is still needed Code Status:  full code Last date of multidisciplinary goals of care discussion [1/10]  Kipp Brood, MD [facility] ICU Physician [place]  Pager:  [phone] Or Epic Secure Chat After hours: [phone].  04/16/2021, 9:25 AM

## 2021-04-16 NOTE — Evaluation (Signed)
Physical Therapy Evaluation Patient Details Name: Billy Cherry MRN: [id number] DOB: 03/16/1970 Today's Date: 04/16/2021  History of Present Illness  Pt is a 51 y.o. male who presented 03/23/21 as a code STEMI after being found unresponsive. V. fib cardiac arrest in setting of respiratory arrest due to suspected angioedema from ACE inhibitor leading to asphyxiation.  Concern on his 12 lead EKG for anterolateral ST elevation so he was taken to the cath lab where his study showed no significant coronary artery disease and an LVEF < 20%. 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 S/p cortak placement 1/27, trach placement 2/11. ETT 1/19 - 2/11. PMH: HTN   Clinical Impression  Pt presents with condition above and deficits mentioned below, see PT Problem List. Per chart, as no family present and pt not communicating, he was independent, working, and living with his wife PTA. Currently, pt displays no attempts to communicate, only spontaneously opening his eyes briefly during session. Pt with straight, blank gaze and no attempts to track therapist and no blink reaction today. Pt with no response/withdrawal to noxious stimuli in all 4 extremities and trunk, except he did open his eyes when providing PROM into R cervical rotation as limitations in ROM were noted there. Pt displays full PROM in all 4 extremities, except his hands due to edema, and no spasticity or active movement in all 4 extremities. There was a brief moment where there may have been a flicker of muscle activation at his L UE and R hip adductor, but it was not very evident if so. Attempted to arouse pt with noxious stimuli, light, music, cold wash cloth to face, and dependently sitting pt up from bed egress position, but no success. If pt begins to display attempts to participate or actively move then he may benefit from AIR, but at  this time he will likely need long-term residential care at a SNF or LTACH. Will continue to follow acutely.     Recommendations for follow up therapy are one component of a multi-disciplinary discharge planning process, led by the attending physician.  Recommendations may be updated based on patient status, additional functional criteria and insurance authorization.  Follow Up Recommendations PT at Long-term acute care hospital    Assistance Recommended at Discharge Frequent or constant Supervision/Assistance  Patient can return home with the following  A lot of help with walking and/or transfers;Two people to help with walking and/or transfers;A lot of help with bathing/dressing/bathroom;Two people to help with bathing/dressing/bathroom;Assistance with cooking/housework;Assistance with feeding;Direct supervision/assist for medications management;Direct supervision/assist for financial management;Assist for transportation;Help with stairs or ramp for entrance    Equipment Recommendations Wheelchair (measurements PT);Wheelchair cushion (measurements PT);Hospital bed;Other (comment) (hoyer lift)  Recommendations for Other Services  OT consult    Functional Status Assessment Patient has had a recent decline in their functional status and demonstrates the ability to make significant improvements in function in a reasonable and predictable amount of time.     Precautions / Restrictions Precautions Precautions: Fall;Other (comment) Precaution Comments: trach; cortrak Restrictions Weight Bearing Restrictions: No      Mobility  Bed Mobility Overal bed mobility: Needs Assistance Bed Mobility: Supine to Sit     Supine to sit: Total assist, HOB elevated     General bed mobility comments: Bed in egress position, no activation or response by pt with dependently lifting trunk and head off elevated HOB into unsupported sitting several times.  Transfers                   General  transfer comment: unable    Ambulation/Gait               General Gait Details: unable  Stairs            Wheelchair Mobility    Modified Rankin (Stroke Patients Only) Modified Rankin (Stroke Patients Only) Pre-Morbid Rankin Score: No symptoms Modified Rankin: Severe disability     Balance Overall balance assessment: Needs assistance Sitting-balance support: No upper extremity supported, Feet unsupported Sitting balance-Leahy Scale: Zero Sitting balance - Comments: No reactional strategies or activation from pt to sit unsupported in egress position.       Standing balance comment: unable                             Pertinent Vitals/Pain Pain Assessment Pain Assessment: Faces Faces Pain Scale: Hurts a little bit Pain Location: noted eyes opening with PROM of neck to R rotation Pain Descriptors / Indicators: Grimacing Pain Intervention(s): Limited activity within patient's tolerance, Monitored during session, Repositioned    Home Living Family/patient expects to be discharged to:: Unsure                   Additional Comments: Per chart: PTA and previous hospital stay patient was independent from home. Patient was working and he and his wife live in [place] in a single family home with 15 steps to the bedroom.    Prior Function Prior Level of Function : Independent/Modified Independent                     Hand Dominance        Extremity/Trunk Assessment   Upper Extremity Assessment Upper Extremity Assessment: RUE deficits/detail;LUE deficits/detail;Defer to OT evaluation;Difficult to assess due to impaired cognition RUE Deficits / Details: No spasticity noted; limited PROM to < 90 degrees for shoulder flexion and abduction to protect shoulder joint, but otherwise Full PROM noted in shoulder, elbow, and wrist; edema limiting finger extension/flexion ROM; no withdrawl to noxious stimuli, suggesting possibly no sensation LUE  Deficits / Details: No spasticity noted; limited PROM to < 90 degrees for shoulder flexion and abduction to protect shoulder joint, but otherwise Full PROM noted in shoulder, elbow, and wrist; edema limiting finger extension/flexion ROM; no withdrawl to noxious stimuli, suggesting possibly no sensation    Lower Extremity Assessment Lower Extremity Assessment: RLE deficits/detail;LLE deficits/detail;Difficult to assess due to impaired cognition RLE Deficits / Details: No spasticity or clonus noted; Full PROM noted in hip, knee, and ankle; no withdrawl to noxious stimuli, suggesting possibly no sensation LLE Deficits / Details: No spasticity or clonus noted; Full PROM noted in hip, knee, and ankle; no withdrawl to noxious stimuli, suggesting possibly no sensation    Cervical / Trunk Assessment Cervical / Trunk Assessment: Other exceptions Cervical / Trunk Exceptions: Limited R cervical rotation PROM as pt resting with neck in L cervical lateral flexion and rotation  Communication   Communication: Tracheostomy (no attempts to communicate by pt)  Cognition Arousal/Alertness: Lethargic Behavior During Therapy: Flat affect Overall Cognitive Status: Impaired/Different from baseline Area of Impairment: Rancho level               Rancho Levels of Cognitive Functioning Rancho Los Amigos Scales of Cognitive Functioning: No response  General Comments: Pt with no withdrawal/physical response or response noted via monitoring vitals with all stimuli; including lights, cold wash cloth, noxious stimuli, dependently bringing pt to unsupported sitting, PROM (except eyes opened with PROM into cervical rotation to R), or music. No active movement noted throughout body except with coughing and potentially slight flicker of L UE and R hip adductor muscle resistance noted.   Rancho Mirant Scales of Cognitive Functioning: No response    General Comments General comments (skin  integrity, edema, etc.): HR: 93 at start - 97 at highest during session; SpO2 97% start, varying 96-98% during session on 28% FiO2 5L trach collar; RR 18-22 at start and up to 25 during session; BP 163/100 start, 164/107 during session, 168/114 end of session    Exercises General Exercises - Upper Extremity Shoulder Flexion: PROM, Both, 5 reps, Supine (<90') Shoulder ABduction: PROM, Both, 5 reps, Supine (<90') Elbow Flexion: PROM, Both, 5 reps, Supine Elbow Extension: PROM, Both, 5 reps, Supine Wrist Flexion: PROM, Both, 5 reps, Supine Wrist Extension: PROM, Both, 5 reps, Supine Digit Composite Flexion: PROM, Both, 5 reps, Supine Composite Extension: PROM, Both, 5 reps, Supine General Exercises - Lower Extremity Ankle Circles/Pumps: PROM, Both, 5 reps, Supine Heel Slides: PROM, Both, 5 reps, Supine Other Exercises Other Exercises: PROM into cervical rotation to R 5x Other Exercises: positioned pt with UEs elevated and towel roll under L side of neck to encourage edema control and midline alignment of neck   Assessment/Plan    PT Assessment Patient needs continued PT services  PT Problem List Decreased strength;Decreased activity tolerance;Decreased balance;Decreased mobility;Decreased cognition;Cardiopulmonary status limiting activity;Impaired sensation       PT Treatment Interventions DME instruction;Gait training;Functional mobility training;Therapeutic activities;Therapeutic exercise;Balance training;Neuromuscular re-education;Cognitive remediation;Patient/family education;Wheelchair mobility training    PT Goals (Current goals can be found in the Care Plan section)  Acute Rehab PT Goals Patient Stated Goal: did not state PT Goal Formulation: Patient unable to participate in goal setting Time For Goal Achievement: 04/30/21 Potential to Achieve Goals: Poor    Frequency Min 3X/week     Co-evaluation               AM-PAC PT "6 Clicks" Mobility  Outcome Measure Help  needed turning from your back to your side while in a flat bed without using bedrails?: Total Help needed moving from lying on your back to sitting on the side of a flat bed without using bedrails?: Total Help needed moving to and from a bed to a chair (including a wheelchair)?: Total Help needed standing up from a chair using your arms (e.g., wheelchair or bedside chair)?: Total Help needed to walk in hospital room?: Total Help needed climbing 3-5 steps with a railing? : Total 6 Click Score: 6    End of Session Equipment Utilized During Treatment: Oxygen Activity Tolerance: Patient limited by lethargy Patient left: in bed;with call bell/phone within reach;with bed alarm set Nurse Communication: Mobility status PT Visit Diagnosis: Muscle weakness (generalized) (M62.81);Difficulty in walking, not elsewhere classified (R26.2);Other symptoms and signs involving the nervous system (R29.898);Adult, failure to thrive (R62.7)    Time: 0932-6712 PT Time Calculation (min) (ACUTE ONLY): 29 min   Charges:   PT Evaluation $PT Eval Moderate Complexity: 1 Mod PT Treatments $Therapeutic Exercise: 8-22 mins        Raymond Gurney, PT, DPT Acute Rehabilitation Services  Pager: [phone] Office: [phone]   Jewel Baize 04/16/2021, 3:13 PM

## 2021-04-16 NOTE — Progress Notes (Signed)
Pt placed on documented full vent setting to rest for the night. Pt tolerating well

## 2021-04-16 NOTE — Progress Notes (Signed)
SLP Cancellation Note  Patient Details Name: Billy Cherry MRN: [id number] DOB: 1971/01/28   Cancelled treatment:        Attempted to see pt for swallow and PMSV evaluations.  Pt continues to require vent and is not medically ready for assessment at this time.  Pt may not be appropriate for inline PMV given anoxic brain injury.  SLP will follow for possible trials when pt on TCT.   Kerrie Pleasure, MA, CCC-SLP Acute Rehabilitation Services Office: [phone] 04/16/2021, 9:15 AM

## 2021-04-16 NOTE — Progress Notes (Signed)
Pharmacy Antibiotic Note  Billy Cherry is a 51 y.o. male admitted on 03/30/2021 Vfib arrest d/t angioedema from ACEi and now concern for hospital-acquired pneumonia. WBC 17.4, tmax 101.5. 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 Literature review indicates vancomycin and minocycline as additional agents with high sensitivity, up to 100%. Discussed with MD. Pharmacy has been consulted for vancomycin dosing.  Cr has remained stable, will check vancomycin levels today and tomorrow morning to calculate AUC.  Plan: Continue vancomycin 1250mg  IV q24h   Height: 5\' 10"  (177.8 cm) Weight: 100 kg (220 lb 7.4 oz) IBW/kg (Calculated) : 73  Temp (24hrs), Avg:99.3 F (37.4 C), Min:98.5 F (36.9 C), Max:99.8 F (37.7 C)  Recent Labs  Lab 04/12/21 0251 04/13/21 0210 04/14/21 0138 04/15/21 0121 04/16/21 0051  WBC 21.4* 17.4* 16.7* 11.7* 14.0*  CREATININE 1.42* 1.53* 1.44* 1.45* 1.53*     Estimated Creatinine Clearance: 68.5 mL/min (A) (by C-G formula based on SCr of 1.53 mg/dL (H)).    Allergies  Allergen Reactions   Lisinopril Swelling    angioedema    Antimicrobials this admission: CTX 1/21 >> 1/25 Cefepime 1/31 >> 2/2 >2/9 Ancef 2/2 >> 2/6 Vancomycin 2/9>>  Dose adjustments this admission:  Microbiology results: 1/31 TA - MSSA and Corynebacterium  2/6 Bcx NGTD 2/6 resp cx: flavobacterium odoratum (S cipro, bactrim, pip/tazo)  Thank you for allowing pharmacy to be a part of this patients care.  2/31 04/16/2021 10:57 AM

## 2021-04-17 ENCOUNTER — Encounter (HOSPITAL_COMMUNITY): Payer: Self-pay | Admitting: Pulmonary Disease

## 2021-04-17 LAB — GLUCOSE, CAPILLARY
Glucose-Capillary: 133 mg/dL — ABNORMAL HIGH (ref 70–99)
Glucose-Capillary: 118 mg/dL — ABNORMAL HIGH (ref 70–99)
Glucose-Capillary: 124 mg/dL — ABNORMAL HIGH (ref 70–99)
Glucose-Capillary: 137 mg/dL — ABNORMAL HIGH (ref 70–99)
Glucose-Capillary: 121 mg/dL — ABNORMAL HIGH (ref 70–99)
Glucose-Capillary: 110 mg/dL — ABNORMAL HIGH (ref 70–99)

## 2021-04-17 LAB — BASIC METABOLIC PANEL
Anion gap: 10 (ref 5–15)
BUN: 56 mg/dL — ABNORMAL HIGH (ref 6–20)
CO2: 26 mmol/L (ref 22–32)
Calcium: 8.6 mg/dL — ABNORMAL LOW (ref 8.9–10.3)
Chloride: 112 mmol/L — ABNORMAL HIGH (ref 98–111)
Creatinine, Ser: 1.27 mg/dL — ABNORMAL HIGH (ref 0.61–1.24)
GFR, Estimated: >60 mL/min (ref >60)
Glucose, Bld: 136 mg/dL — ABNORMAL HIGH (ref 70–99)
Potassium: 3.9 mmol/L (ref 3.5–5.1)
Sodium: 148 mmol/L — ABNORMAL HIGH (ref 135–145)

## 2021-04-17 LAB — CBC
HCT: 37.1 % — ABNORMAL LOW (ref 39.0–52.0)
Hemoglobin: 11.5 g/dL — ABNORMAL LOW (ref 13.0–17.0)
MCH: 30.3 pg (ref 26.0–34.0)
MCHC: 31 g/dL (ref 30.0–36.0)
MCV: 97.6 fL (ref 80.0–100.0)
Platelets: 294 10*3/uL (ref 150–400)
RBC: 3.8 MIL/uL — ABNORMAL LOW (ref 4.22–5.81)
RDW: 13.8 % (ref 11.5–15.5)
WBC: 18.4 10*3/uL — ABNORMAL HIGH (ref 4.0–10.5)
nRBC: 0 % (ref 0.0–0.2)

## 2021-04-17 LAB — HEPATIC FUNCTION PANEL
ALT: 20 U/L (ref 0–44)
AST: 34 U/L (ref 15–41)
Albumin: 2.1 g/dL — ABNORMAL LOW (ref 3.5–5.0)
Alkaline Phosphatase: 40 U/L (ref 38–126)
Bilirubin, Direct: <0.1 mg/dL (ref 0.0–0.2)
Total Bilirubin: 0.7 mg/dL (ref 0.3–1.2)
Total Protein: 7.7 g/dL (ref 6.5–8.1)

## 2021-04-17 LAB — MAGNESIUM: Magnesium: 2.6 mg/dL — ABNORMAL HIGH (ref 1.7–2.4)

## 2021-04-17 LAB — PHOSPHORUS: Phosphorus: 3.8 mg/dL (ref 2.5–4.6)

## 2021-04-17 LAB — VANCOMYCIN, TROUGH: Vancomycin Tr: 11 ug/mL — ABNORMAL LOW (ref 15–20)

## 2021-04-17 MED ORDER — AMLODIPINE BESYLATE 10 MG PO TABS
10.0000 mg | ORAL_TABLET | Freq: Every day | ORAL | Status: DC
  Administered 2021-04-17 – 2021-04-20 (×4): 10 mg via ORAL
  Filled 2021-04-17 (×4): qty 1

## 2021-04-17 NOTE — Progress Notes (Signed)
° °  Palliative Medicine Inpatient Follow Up Note   Palliative Care has spoken with patients spouse, Billy Cherry.   She has shared the desire to continue full aggressive measures.   Given this goal we will sign off.   Please do not hesitate to re-consult the PMT service if addition needs arise, or support is requested. ______________________________________________________________________________________ [facility] Palliative Medicine Team Team Cell Phone: [phone] Please utilize secure chat with additional questions, if there is no response within 30 minutes please call the above phone number  Palliative Medicine Team providers are available by phone from 7am to 7pm daily and can be reached through the team cell phone.  Should this patient require assistance outside of these hours, please call the patient's attending physician.

## 2021-04-17 NOTE — Evaluation (Signed)
Occupational Therapy Evaluation Patient Details Name: Billy Cherry MRN: [id number] DOB: Dec 05, 1970 Today's Date: 04/17/2021   History of Present Illness 51 y.o. male who presented 03/23/21 as a code STEMI after being found unresponsive. V. fib cardiac arrest in setting of respiratory arrest due to suspected angioedema from ACE inhibitor leading to asphyxiation.  Concern on his 12 lead EKG for anterolateral ST elevation so he was taken to the cath lab where his study showed no significant coronary artery disease and an LVEF < 20%. 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 S/p cortak placement 1/27, trach placement 2/11. ETT 1/19 - 2/11. PMH: HTN   Clinical Impression   PTA, pt was living with his wife and was independent and working; PLOF was collected from mother who was present. Pt requiring Total A for ADLs and bed mobility. Facilitating PROM of BUE/BLEs.  Providing education for ROM exercises and edema management; mother verbalized understanding. Pt would benefit from further acute OT to facilitate safe dc. Recommend dc to LTACH with follow up therapy for further OT to optimize safety, independence with ADLs, and return to PLOF.      Recommendations for follow up therapy are one component of a multi-disciplinary discharge planning process, led by the attending physician.  Recommendations may be updated based on patient status, additional functional criteria and insurance authorization.   Follow Up Recommendations  OT at [facility]-term acute care hospital (Pending pt progress)    Assistance Recommended at Discharge Frequent or constant Supervision/Assistance  Patient can return home with the following Other (comment) (Total A)    Functional Status Assessment  Patient has had a recent decline in their functional status and/or demonstrates limited ability to make significant  improvements in function in a reasonable and predictable amount of time  Equipment Recommendations  None recommended by OT    Recommendations for Other Services       Precautions / Restrictions Precautions Precautions: Fall;Other (comment) Precaution Comments: trach; cortrak      Mobility Bed Mobility               General bed mobility comments: Total A for repositioning    Transfers                   General transfer comment: defered for safety      Balance                                           ADL either performed or assessed with clinical judgement   ADL Overall ADL's : Needs assistance/impaired                                       General ADL Comments: Total A for ADLs     Vision         Perception     Praxis      Pertinent Vitals/Pain Pain Assessment Pain Assessment: Faces Faces Pain Scale: No hurt Pain Intervention(s): Monitored during session     Hand Dominance     Extremity/Trunk Assessment Upper Extremity Assessment Upper Extremity Assessment: RUE deficits/detail;LUE deficits/detail RUE Deficits / Details: No active movement. No responce to noxious stimuli. slight edema. LUE Deficits / Details: No active movement.  No responce to noxious stimuli. slight edema.   Lower Extremity Assessment Lower Extremity Assessment: Defer to PT evaluation   Cervical / Trunk Assessment Cervical / Trunk Exceptions: Limited cervical ROM and control   Communication Communication Communication: Tracheostomy   Cognition Arousal/Alertness: Lethargic Behavior During Therapy: Flat affect Overall Cognitive Status: Difficult to assess                                 General Comments: Not opening eyes to stimuli. No responce to noxious stimuli.     General Comments  Mother present during session    Exercises Exercises: General Upper Extremity, General Lower Extremity General Exercises -  Upper Extremity Shoulder Flexion: PROM, Both, 10 reps, Supine Shoulder ABduction: PROM, Both, 10 reps, Supine Elbow Flexion: PROM, Both, 10 reps, Supine Elbow Extension: PROM, Both, 10 reps, Supine Wrist Flexion: PROM, Both, 10 reps Wrist Extension: PROM, Both, 10 reps, Supine Digit Composite Flexion: PROM, Both, 10 reps, Supine Composite Extension: PROM, Both, 10 reps, Supine General Exercises - Lower Extremity Ankle Circles/Pumps: PROM, Both, 10 reps, Supine Heel Slides: PROM, Both, 10 reps, Supine Hip ABduction/ADduction: PROM, Both, 10 reps, Supine Other Exercises Other Exercises: PROM into cervical rotation 5x Other Exercises: Educating family on PROM of BUE/BLE and edema management; mother (who is a retired Lawyer) verbalized understanding   Shoulder Instructions      Home Living Family/patient expects to be discharged to:: Private residence Living Arrangements: Spouse/significant other Available Help at Discharge: Family Type of Home: House                           Additional Comments: Mother providing information about home and PLOF      Prior Functioning/Environment Prior Level of Function : Independent/Modified Independent;Working/employed;Driving               ADLs Comments: Working at [employer] List: Decreased strength;Decreased range of motion;Impaired balance (sitting and/or standing);Decreased activity tolerance;Decreased cognition;Decreased safety awareness;Increased edema      OT Treatment/Interventions: Self-care/ADL training;Therapeutic exercise;Energy conservation;DME and/or AE instruction;Therapeutic activities;Patient/family education    OT Goals(Current goals can be found in the care plan section) Acute Rehab OT Goals Patient Stated Goal: Unstated OT Goal Formulation: Patient unable to participate in goal setting Time For Goal Achievement: 05/01/21 Potential to Achieve Goals: Good  OT Frequency: Min 2X/week     Co-evaluation              AM-PAC OT "6 Clicks" Daily Activity     Outcome Measure Help from another person eating meals?: Total Help from another person taking care of personal grooming?: Total Help from another person toileting, which includes using toliet, bedpan, or urinal?: Total Help from another person bathing (including washing, rinsing, drying)?: Total Help from another person to put on and taking off regular upper body clothing?: Total Help from another person to put on and taking off regular lower body clothing?: Total 6 Click Score: 6   End of Session Nurse Communication: Mobility status  Activity Tolerance: Patient tolerated treatment well Patient left: in bed;with call bell/phone within reach;with family/visitor present  OT Visit Diagnosis: Unsteadiness on feet (R26.81);Other abnormalities of gait and mobility (R26.89);Muscle weakness (generalized) (M62.81)                Time: 8841-6606 OT Time Calculation (min): 28 min Charges:  OT General  Charges $OT Visit: 1 Visit OT Evaluation $OT Eval Moderate Complexity: 1 Mod OT Treatments $Therapeutic Exercise: 8-22 mins  Marigny Borre MSOT, OTR/L Acute Rehab Pager: [phone] Office: [phone]  Theodoro Grist Carlee Vonderhaar 04/17/2021, 2:57 PM

## 2021-04-17 NOTE — Consult Note (Signed)
Chief Complaint: Dysphagia and malnutrition due to anoxic brain injury. Request is for gastrostomy tube placement.  Referring Physician(s): Dr. Glade Lloyd  Supervising Physician: Mir, Sharen Heck  Patient Status: [facility] - In-pt  History of Present Illness: Billy Cherry is a 51 y.o. male inpatient. 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 Team is requesting gastrostomy tube placement for ongoing nutritional abscess.    Unable to assess ROS. Return precautions and treatment recommendations and follow-up discussed with the patient 's wife who is agreeable with the plan.   Past Medical History:  Diagnosis Date   Hypertension     Past Surgical History:  Procedure Laterality Date   BACK SURGERY     CORONARY/GRAFT ACUTE MI REVASCULARIZATION N/A 03/23/2021   Procedure: Coronary/Graft Acute MI Revascularization;  Surgeon: Isaias Cowman, MD;  Location: [place] [facility];  Service: Cardiovascular;  Laterality: N/A;   LEFT HEART CATH AND CORONARY ANGIOGRAPHY N/A 03/23/2021   Procedure: LEFT HEART CATH AND CORONARY ANGIOGRAPHY;  Surgeon: Isaias Cowman, MD;  Location: [facility];  Service: Cardiovascular;  Laterality: N/A;   SHOULDER SURGERY      Allergies: Lisinopril  Medications: Prior to Admission medications   Medication Sig Start Date End Date Taking? Authorizing Provider  acetaminophen (TYLENOL) 325 MG tablet Place 2 tablets (650 mg total) into feeding tube every 6 (six) hours as needed for mild pain or fever. 03/30/21   Bradly Bienenstock, NP  carvedilol (COREG) 25 MG tablet Place 1 tablet (25 mg total) into feeding tube 2 (two) times daily with a meal. 03/30/21   Bradly Bienenstock, NP  Chlorhexidine Gluconate Cloth 2 % PADS Apply 6 each topically daily at 6 (six) AM. 03/31/21    Bradly Bienenstock, NP  chlorhexidine gluconate, MEDLINE KIT, (PERIDEX) 0.12 % solution 15 mLs by Mouth Rinse route 2 (two) times daily. 03/30/21   Bradly Bienenstock, NP  clevidipine (CLEVIPREX) 0.5 MG/ML EMUL Inject 0-21 mg/hr into the vein continuous. 03/30/21   Darel Hong D, NP  dextrose 5 % solution Inject 75 mL/hr into the vein continuous. 03/30/21   Bradly Bienenstock, NP  docusate (COLACE) 50 MG/5ML liquid Place 10 mLs (100 mg total) into feeding tube 2 (two) times daily as needed for mild constipation. 03/30/21   Bradly Bienenstock, NP  enoxaparin (LOVENOX) 60 MG/0.6ML injection Inject 0.55 mLs (55 mg total) into the skin daily. 03/30/21   Bradly Bienenstock, NP  hydrALAZINE (APRESOLINE) 20 MG/ML injection Inject 0.5-1 mLs (10-20 mg total) into the vein every 4 (four) hours as needed (SBP>180 DBP>105). 03/30/21   Bradly Bienenstock, NP  hydrALAZINE (APRESOLINE) 25 MG tablet Place 1 tablet (25 mg total) into feeding tube every 8 (eight) hours. 03/30/21   Bradly Bienenstock, NP  HYDROmorphone (DILAUDID) 1 MG/ML injection Inject 1 mL (1 mg total) into the vein every 30 (thirty) minutes as needed (to achieve RASS & CPOT goal.). 03/30/21   Bradly Bienenstock, NP  HYDROmorphone (DILAUDID) 1 MG/ML injection Inject 1-3 mLs (1-3 mg total) into the vein every hour as needed (to achieve RASS & CPOT goal.). 03/30/21   Bradly Bienenstock, NP  insulin aspart (NOVOLOG) 100 UNIT/ML injection Inject 0-15 Units into the skin every 4 (four) hours. 03/30/21   Bradly Bienenstock, NP  isosorbide dinitrate (ISORDIL) 20 MG tablet Place 1 tablet (  20 mg total) into feeding tube 3 (three) times daily. 03/30/21   Bradly Bienenstock, NP  labetalol (NORMODYNE) 5 MG/ML injection Inject 2 mLs (10 mg total) into the vein every 2 (two) hours as needed (Give second line if persistent hypertension >160 (hold for HR <60)). 03/30/21   Darel Hong D, NP  levETIRAcetam 750 mg in sodium chloride 0.9 % 100 mL Inject 750 mg into the vein every 12  (twelve) hours. 03/30/21   Bradly Bienenstock, NP  Mouthwashes (MOUTH RINSE) LIQD solution 15 mLs by Mouth Rinse route every 4 (four) hours. 03/30/21   Bradly Bienenstock, NP  Nutritional Supplements (FEEDING SUPPLEMENT, PROSOURCE TF,) liquid Place 45 mLs into feeding tube 2 (two) times daily. 03/30/21   Bradly Bienenstock, NP  Nutritional Supplements (FEEDING SUPPLEMENT, VITAL HIGH PROTEIN,) LIQD liquid Place 1,000 mLs into feeding tube continuous. 03/30/21   Bradly Bienenstock, NP  ondansetron (ZOFRAN) 4 MG/2ML SOLN injection Inject 2 mLs (4 mg total) into the vein every 6 (six) hours as needed for nausea. 03/30/21   Bradly Bienenstock, NP  pantoprazole sodium (PROTONIX) 40 mg Place 40 mg into feeding tube daily. 03/30/21   Bradly Bienenstock, NP  polyethylene glycol (MIRALAX / GLYCOLAX) 17 g packet Place 17 g into feeding tube daily as needed for mild constipation or moderate constipation. 03/30/21   Darel Hong D, NP  propofol (DIPRIVAN) 1000 MG/100ML EMUL injection Inject [phone] mcg/min into the vein continuous. 03/30/21   Darel Hong D, NP  sodium chloride 0.9 % infusion Inject 250 mLs into the vein as needed (for IV line care  (Saline / Heparin Lock)). 03/30/21   Darel Hong D, NP  sodium chloride 0.9 % infusion Inject 250 mLs into the vein continuous. 03/30/21   Darel Hong D, NP  sodium chloride flush (NS) 0.9 % SOLN Inject 3 mLs into the vein every 12 (twelve) hours. 03/30/21   Darel Hong D, NP  sodium chloride flush (NS) 0.9 % SOLN Inject 3 mLs into the vein as needed. 03/30/21   Bradly Bienenstock, NP  valproic acid (DEPAKENE) 250 MG/5ML solution Place 10 mLs (500 mg total) into feeding tube 3 (three) times daily. 03/30/21   Bradly Bienenstock, NP  Water For Irrigation, Sterile (FREE WATER) SOLN Place 100 mLs into feeding tube every 4 (four) hours. 03/30/21   Bradly Bienenstock, NP     History reviewed. No pertinent family history.  Social History   Socioeconomic History   Marital  status: Married    Spouse name: Not on file   Number of children: Not on file   Years of education: Not on file   Highest education level: Not on file  Occupational History   Not on file  Tobacco Use   Smoking status: Never   Smokeless tobacco: Never  Vaping Use   Vaping Use: Unknown  Substance and Sexual Activity   Alcohol use: Yes   Drug use: Never   Sexual activity: Never  Other Topics Concern   Not on file  Social History Narrative   Not on file   Social Determinants of Health   Financial Resource Strain: Not on file  Food Insecurity: Not on file  Transportation Needs: Not on file  Physical Activity: Not on file  Stress: Not on file  Social Connections: Not on file     Review of Systems: A 12 point ROS discussed and pertinent positives are indicated in the HPI above.  All other systems  are negative.  Review of Systems  Unable to perform ROS: Patient unresponsive   Vital Signs: BP (!) 156/102    Pulse 91    Temp 99.2 F (37.3 C) (Axillary)    Resp (!) 25    Ht 5\' 10"  (1.778 m)    Wt 220 lb 7.4 oz (100 kg)    SpO2 96%    BMI 31.63 kg/m   Physical Exam Vitals and nursing note reviewed.  Constitutional:      Appearance: He is well-developed.  HENT:     Head: Normocephalic.  Cardiovascular:     Rate and Rhythm: Normal rate and regular rhythm.     Heart sounds: Normal heart sounds.  Pulmonary:     Effort: Pulmonary effort is normal.     Breath sounds: Rhonchi present.     Comments: Trach and vented.  Musculoskeletal:        General: Normal range of motion.     Cervical back: Normal range of motion.  Skin:    General: Skin is dry.  Neurological:     Mental Status: He is alert and oriented to person, place, and time.    Imaging: CT ABDOMEN WO CONTRAST  Result Date: 04/14/2021 CLINICAL DATA:  Anatomy for G-tube placement EXAM: CT ABDOMEN WITHOUT CONTRAST TECHNIQUE: Multidetector CT imaging of the abdomen was performed following the standard protocol  without IV contrast. RADIATION DOSE REDUCTION: This exam was performed according to the departmental dose-optimization program which includes automated exposure control, adjustment of the mA and/or kV according to patient size and/or use of iterative reconstruction technique. COMPARISON:  None. FINDINGS: Lower chest: Possible trace right pleural effusion. Right lower lobe atelectasis. Mild left lower lobe atelectasis. Hepatobiliary: No focal liver abnormality. No gallstones, gallbladder wall thickening, or pericholecystic fluid. No biliary dilatation. Pancreas: No focal lesion. Normal pancreatic contour. No surrounding inflammatory changes. No main pancreatic ductal dilatation. Spleen: Normal in size without focal abnormality. Adrenals/Urinary Tract: No adrenal nodule bilaterally. No nephrolithiasis and no hydronephrosis. There is a 2.9 cm fluid density lesion within the right kidney that likely represents a simple renal cyst. No ureterolithiasis or hydroureter. The urinary bladder is unremarkable. Stomach/Bowel: Enteric tube with tip in the region of the pyloric lumen. Stomach is within normal limits. No evidence of bowel wall thickening or dilatation. Colonic diverticulosis. Appendix appears normal. Vascular/Lymphatic: No abdominal aorta aneurysm. Mild atherosclerotic plaque of the aorta and its branches. No abdominal lymphadenopathy. Other: No intraperitoneal free fluid. No intraperitoneal free gas. No organized fluid collection. Musculoskeletal: No abdominal wall hernia or abnormality. No suspicious lytic or blastic osseous lesions. No acute displaced fracture. Multilevel degenerative changes of the spine. IMPRESSION: 1. Enteric tube at the level of the pylorus. 2. Colonic diverticulosis with no acute diverticulitis. 3. Query trace right pleural effusion with associated right lower lobe opacity that likely represents atelectasis. Superimposed infection/inflammation not excluded. 4. Aortic Atherosclerosis  (ICD10-I70.0). 5. Otherwise limited evaluation of the abdomen on this noncontrast study. Electronically Signed   By: 06/12/2021 M.D.   On: 04/14/2021 17:32   DG Chest 1 View  Result Date: 04/13/2021 CLINICAL DATA:  ET tube present, post cardiac arrest EXAM: CHEST  1 VIEW COMPARISON:  Radiograph 04/10/2021 FINDINGS: Endotracheal tube tip overlies the midthoracic trachea. Unchanged, enlarged cardiac silhouette. Unchanged faint right lower lung opacity. No pleural effusion. No pneumothorax. No acute osseous abnormality. IMPRESSION: Endotracheal tube tip overlies the midthoracic trachea. Unchanged faint right lower lung opacity, possibly atelectasis or developing infection. Electronically Signed  By: Maurine Simmering M.D.   On: 04/13/2021 08:17   DG Chest 1 View  Result Date: 03/23/2021 CLINICAL DATA:  51 year old male status post cardiopulmonary arrest, out of hospital CPR. Altered mental status. EXAM: CHEST  1 VIEW COMPARISON:  Portable chest 0308 hours. FINDINGS: Portable AP upright view at 0458 hours. Endotracheal tube tip in good position between the clavicles and carina. Enteric tube terminates in the left upper quadrant. Pacer pads over the left lower chest. Normal cardiac size and mediastinal contours. Allowing for portable technique the lungs are clear. Visible bowel-gas within normal limits. No displaced rib fracture. IMPRESSION: 1. Satisfactory ET tube and enteric tube. 2.  No acute cardiopulmonary abnormality. Electronically Signed   By: Genevie Ann M.D.   On: 03/23/2021 06:05   DG Abd 1 View  Result Date: 03/23/2021 CLINICAL DATA:  Evaluate OG tube placement. EXAM: ABDOMEN - 1 VIEW COMPARISON:  None. FINDINGS: The OG tube courses below the level of the GE junction. The tip of the OG tube is in the expected location of the gastric fundus with side port well below the GE junction. Single borderline dilated loop of small bowel noted in the left lower quadrant of the abdomen with normal caliber colon.  IMPRESSION: OG tube tip is in the gastric fundus with side port well below the GE junction. Electronically Signed   By: Kerby Moors M.D.   On: 03/23/2021 05:16   CT HEAD WO CONTRAST (5MM)  Result Date: 03/29/2021 CLINICAL DATA:  Seizure disorder, clinical change EXAM: CT HEAD WITHOUT CONTRAST TECHNIQUE: Contiguous axial images were obtained from the base of the skull through the vertex without intravenous contrast. RADIATION DOSE REDUCTION: This exam was performed according to the departmental dose-optimization program which includes automated exposure control, adjustment of the mA and/or kV according to patient size and/or use of iterative reconstruction technique. COMPARISON:  03/23/2021 CT head, correlation is also made with MRI 03/24/2021 FINDINGS: Brain: General preservation of gray white differentiation, with mild sulcal crowding, similar to 03/23/2021. No new hypodensity to suggest acute infarct. No acute hemorrhage, mass, mass effect, or midline shift. No hydrocephalus or extra-axial collection. Vascular: No hyperdense vessel Skull: Normal. Negative for fracture or focal lesion. Sinuses/Orbits: Mucosal thickening in the right-greater-than-left maxillary sinuses, right-greater-than-left ethmoid air cells, and bilateral sphenoid sinuses. The orbits are unremarkable. Other: Fluid throughout the right mastoid air cells. Partial opacification of the left mastoid air cells. The patient remains intubated. IMPRESSION: 1. Overall unchanged exam compared to 03/23/2021, with possible sulcal crowding but preservation of gray-white differentiation. If there is persistent concern for progression of anoxic injury, consider repeat MRI. 2. Mucosal thickening in the sinuses and fluid in the mastoid air cells, possibly related to intubation. Electronically Signed   By: Merilyn Baba M.D.   On: 03/29/2021 21:24   CT HEAD WO CONTRAST (5MM)  Result Date: 03/23/2021 CLINICAL DATA:  51 year old male status post  cardiopulmonary arrest, out of hospital CPR. Altered mental status. EXAM: CT HEAD WITHOUT CONTRAST TECHNIQUE: Contiguous axial images were obtained from the base of the skull through the vertex without intravenous contrast. RADIATION DOSE REDUCTION: This exam was performed according to the departmental dose-optimization program which includes automated exposure control, adjustment of the mA and/or kV according to patient size and/or use of iterative reconstruction technique. COMPARISON:  None. FINDINGS: Brain: Small round hypodensity at the genu of the left internal capsule on series 2, image 15. Elsewhere gray-white matter differentiation is preserved and within normal limits. However, there is questionable  generalized loss of cerebral sulci. But basilar cisterns appear normal. Normal cerebral volume. No midline shift, ventriculomegaly, mass effect, evidence of mass lesion, intracranial hemorrhage or evidence of cortically based acute infarction. Vascular: Mild generalized intracranial vascular hyperdensity, perhaps related to hemoconcentration. No suspicious intracranial vascular hyperdensity. Skull: No acute osseous abnormality identified. Sinuses/Orbits: Intubated on the scout view. Fluid in the visible pharynx. Right mastoid effusion. Right tympanic cavity remains clear. Mild left mastoid effusion. Paranasal sinuses remain well aerated. Other: No acute orbit or scalp soft tissue finding. IMPRESSION: 1. Difficult without a prior study to exclude the possibility of anoxic injury; questionable loss of cerebral sulci throughout the brain, but the gray-white matter differentiation is preserved. If decreased mental status persists then repeat Head CT in 12-24 hours or noncontrast brain MRI would be valuable. 2. 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 No intracranial hemorrhage or mass effect. 3. Intubated, with mastoid air cell effusions. Electronically Signed   By: Genevie Ann M.D.   On: 03/23/2021  06:04   MR BRAIN WO CONTRAST  Result Date: 03/24/2021 CLINICAL DATA:  Anoxic brain damage. EXAM: MRI HEAD WITHOUT CONTRAST TECHNIQUE: Multiplanar, multiecho pulse sequences of the brain and surrounding structures were obtained without intravenous contrast. COMPARISON:  Head CT March 23, 2021 FINDINGS: Brain: Cortical restricted diffusion along the bilateral ACA-MCA watershed, bilateral occipital lobes, posterior cingulate, bilateral caudate and lentiform nucleus, particularly posterior aspect of the putamen, consistent with hypoxic ischemic injury. Questionable restricted diffusion in the tail of left hippocampus. Punctate foci of restricted diffusion are also seen in the left cerebellar hemisphere, right posterior temporal lobe, medial right frontal lobe and left occipital lobe. Posterior fossa structures are otherwise maintained. No significant mass effect, hydrocephalus, hemorrhage, extra-axial collection or mass lesion. Remote lacunar infarct at the genu of the left internal capsule. A few scattered foci of T2 hyperintensity within the white matter of the cerebral hemispheres and periventricular white matter of the left temporal lobe, nonspecific. Vascular: Normal flow voids. Skull and upper cervical spine: No focal marrow lesion. Sinuses/Orbits: Fluid level within the right maxillary sinus and bilateral mastoid effusion may be related to endotracheal tube. The orbits are maintained. Other: None. 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 Electronically Signed   By: Pedro Earls M.D.   On: 03/24/2021 13:53   CARDIAC CATHETERIZATION  Result Date: 03/23/2021   Dist RCA lesion is 20% stenosed.   There is mild left ventricular systolic dysfunction.   The left ventricular ejection fraction is 45-50% by visual estimate. 1.  Insignificant coronary artery disease 2.  Mild reduced  left ventricular function with estimated LV ejection fraction 45 to 50% Recommendations 1.  Admit to ICU for ventilator management 2.  2D echocardiogram   DG Chest Port 1 View  Result Date: 04/15/2021 CLINICAL DATA:  Status post tracheostomy. EXAM: PORTABLE CHEST 1 VIEW COMPARISON:  04/13/2021 FINDINGS: Interval tracheostomy. Tracheostomy tube tip in satisfactory position above the carina. There is a feeding tube with tip coursing below the level of the GE junction. Stable cardiomediastinal contours. Interval complete atelectasis/consolidation of the entire left lower lobe. New from 04/13/2021. IMPRESSION: 1. Satisfactory position of tracheostomy tube. 2. Interval complete atelectasis/consolidation of the entire left lower lobe. Electronically Signed   By: Kerby Moors M.D.   On: 04/15/2021 15:29   DG CHEST PORT 1 VIEW  Result Date: 04/10/2021 CLINICAL DATA:  Fever, ETT EXAM: PORTABLE CHEST 1 VIEW COMPARISON:  April 01, 2021 FINDINGS: Endotracheal tube projects over the upper thoracic trachea approximately 9 cm from the carina. Enteric tube courses below the diaphragm with tip obscured by collimation. Borderline cardiac enlargement similar prior and accentuated by technique. New hazy right basilar airspace opacity. The visualized skeletal structures are unchanged. IMPRESSION: 1. Endotracheal tube projects over the upper thoracic trachea approximately 9 cm from the carina. 2. New hazy right basilar airspace opacity, may reflect atelectasis or pneumonia. Electronically Signed   By: Dahlia Bailiff M.D.   On: 04/10/2021 09:21   DG CHEST PORT 1 VIEW  Result Date: 04/01/2021 CLINICAL DATA:  Encounter for aspiration into airway. EXAM: PORTABLE CHEST 1 VIEW COMPARISON:  03/30/2021 and older studies. FINDINGS: Endotracheal tube tip projects at the inferior level of the medial clavicles, 6.2 cm above the carina. Enteric tube passes below the diaphragm well into the stomach. Cardiac silhouette is normal in size.  No mediastinal or hilar masses. Lungs remain clear. IMPRESSION: 1. No significant change from the most recent prior study. 2. No acute findings.  Lungs are clear. 3. Stable support apparatus. Electronically Signed   By: Lajean Manes M.D.   On: 04/01/2021 08:07   DG Chest Port 1 View  Result Date: 03/30/2021 CLINICAL DATA:  Post transport. EXAM: PORTABLE CHEST 1 VIEW COMPARISON:  Radiograph 03/26/2021 FINDINGS: High positioning of the endotracheal tube at the level of the clavicular heads, 9.2 cm from the carina. Enteric tube is in place with tip below the diaphragm. A presumed esophageal temperature probe is in place. Multiple overlying monitoring devices. Persistent low lung volumes, although slightly improved from prior exam. Stable upper normal heart size. Improving perihilar atelectasis. No pneumothorax or pleural effusion. IMPRESSION: 1. High positioning of the endotracheal tube at the level of the clavicular heads, 9.2 cm from the carina. Recommend advancement of approximately 2-3 cm. 2. Enteric tube in place. 3. Improving perihilar atelectasis. Electronically Signed   By: Keith Rake M.D.   On: 03/30/2021 19:30   DG Chest Port 1 View  Result Date: 03/26/2021 CLINICAL DATA:  Hypoxia. EXAM: PORTABLE CHEST 1 VIEW COMPARISON:  Chest XR, 03/25/2021. FINDINGS: Support lines: ETT within the midthoracic trachea, 7 cm from carina. Esophageal temperature probe. Enteric feeding tube with tip and side port within stomach. Overlying cardiac leads. Cardiac silhouette is within normal limits given technique and degree of inflation. Hypoinflation. Bilateral perihilar and basilar streaky interstitial opacities. No pleural effusion or pneumothorax. No interval osseous abnormality. IMPRESSION: 1. Well-positioned ET tube, esophageal temperature probe and enteric feeding tube. 2. Hypoinflation with minimal bilateral atelectasis. Electronically Signed   By: Michaelle Birks M.D.   On: 03/26/2021 09:38   DG Chest Port 1  View  Result Date: 03/25/2021 CLINICAL DATA:  Acute respiratory failure with hypoxia. EXAM: PORTABLE CHEST 1 VIEW COMPARISON:  03/24/2021 FINDINGS: ET tube tip is above the carina. There is a NG tube with tip and side port below the GE junction. Temperature probe is noted with tip terminating in the midesophagus. Heart size normal. No pleural effusion or edema. Lung volumes are low. No airspace opacities. IMPRESSION: 1. Support apparatus positioned as above. 2. No acute cardiopulmonary abnormalities noted. Electronically Signed   By: Kerby Moors M.D.   On: 03/25/2021 08:04   DG Chest Port 1 View  Result Date: 03/24/2021 CLINICAL DATA:  Acute respiratory failure, hypoxia EXAM: PORTABLE CHEST 1 VIEW COMPARISON:  Chest radiograph 1 day prior FINDINGS: The endotracheal tube tip is approximately 3.1 cm from the carina. The enteric  catheter is coiled in the stomach. A presumed esophageal temperature probe terminates in the mid thorax. The cardiomediastinal silhouette is stable. Lung aeration is unchanged. There is no new or worsening focal airspace disease. There is no pleural effusion or pneumothorax. There is a mildly displaced fracture of the left fifth rib possibly related to CPR. IMPRESSION: Mildly displaced fracture of the left fifth rib which may be related to CPR. Otherwise, stable chest with no radiographic evidence of acute cardiopulmonary process. Electronically Signed   By: Valetta Mole M.D.   On: 03/24/2021 07:21   DG Chest Portable 1 View  Result Date: 03/23/2021 CLINICAL DATA:  Status post intubation. EXAM: PORTABLE CHEST 1 VIEW COMPARISON:  None. FINDINGS: Overlying radiopaque cardiac lead wires and tags are seen. An endotracheal tube is seen with its distal tip approximately 0.9 cm from the carina. Mildly decreased lung volumes are noted. Mild atelectasis and/or infiltrate is seen along the infrahilar region of the right lung base. There is no evidence of a pleural effusion or pneumothorax.  The heart size and mediastinal contours are within normal limits. The visualized skeletal structures are unremarkable. IMPRESSION: 1. Endotracheal tube with its distal tip approximately 0.9 cm from the carina. 2. Mild right basilar atelectasis and/or infiltrate. Electronically Signed   By: Virgina Norfolk M.D.   On: 03/23/2021 03:30   DG Abd Portable 1V  Result Date: 03/31/2021 CLINICAL DATA:  NG tube placement EXAM: PORTABLE ABDOMEN - 1 VIEW COMPARISON:  03/23/2021 FINDINGS: Limited radiograph of the lower chest and upper abdomen was obtained for the purposes of enteric tube localization. Enteric tube is seen coursing below the diaphragm with distal tip terminating within the expected location of the distal gastric body. IMPRESSION: Enteric tube tip terminating within the distal stomach. Electronically Signed   By: Davina Poke D.O.   On: 03/31/2021 14:32   EEG adult  Result Date: 03/30/2021 Lora Havens, MD     03/30/2021 12:02 PM Patient Name: Kalyb Pemble MRN: [id number] Epilepsy Attending: Lora Havens Referring Physician/Provider: Dr Kerney Elbe Date: 03/30/2021 Duration: 23.18 mins  Patient history: 51 year old male status post cardiac arrest now noted to have eye twitching/remor like movements and rhythmic lip & tongue fasciculations.  EEG evaluate for seizure.  Level of alertness: comatose  AEDs during EEG study: LEV, VPA  Technical aspects: This EEG study was done with scalp electrodes positioned according to the 10-20 International system of electrode placement. Electrical activity was acquired at a sampling rate of $Remov'500Hz'wMyWFK$  and reviewed with a high frequency filter of $RemoveB'70Hz'DnlyMrDD$  and a low frequency filter of $RemoveB'1Hz'NTBBDsHH$ . EEG data were recorded continuously and digitally stored.  Description: EEG showed continuous generalized 5 to 7 Hz theta slowing.  Generalized periodic discharges were also noted at 1.5 to 2 Hz.  Patient was noted to have episodes of eye opening and lip twitching during the  study, more prominent when stimulated. Concomitant EEG showed generalized spikes consistent with myoclonic seizure.  Hyperventilation and photic stimulation were not performed.    ABNORMALITY -Myoclonic seizure, generalized -Continuous slow, generalized  IMPRESSION: This study showed episodes of eye opening and lip twitching, more prominent when stimulated consistent with myoclonic seizures.  Additionally EEG was suggestive of severe diffuse encephalopathy.  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  EEG appears similar to previous day.  Lora Havens   EEG adult  Result Date: 03/29/2021 Lora Havens, MD     03/29/2021  1:36 PM  This patient Name: Billy Cherry MRN: [id number] Epilepsy Attending: Lora Havens Referring Physician/Provider: Bradly Bienenstock, NP Date: 03/29/2021 Duration: 31.59 mins Patient history: 51 year old male status post cardiac arrest now noted to have eye twitching/remor like movements and rhythmic lip & tongue fasciculations.  EEG evaluate for seizure. Level of alertness: comatose AEDs during EEG study: LEV Technical aspects: This EEG study was done with scalp electrodes positioned according to the 10-20 International system of electrode placement. Electrical activity was acquired at a sampling rate of $Remov'500Hz'njMsTu$  and reviewed with a high frequency filter of $RemoveB'70Hz'JTjCLnuo$  and a low frequency filter of $RemoveB'1Hz'yUMHyeIB$ . EEG data were recorded continuously and digitally stored. Description: EEG showed continuous generalized 5 to 7 Hz theta slowing.  Generalized periodic discharges were also noted at 1.5 to 2 Hz. Patient was noted to have episodes of eye opening and lip twitching during the study, more prominent when stimulated. Concomitant EEG showed generalized spikes consistent with myoclonic seizure. Hyperventilation and photic stimulation were not performed.   ABNORMALITY -Myoclonic seizure, generalized -Continuous slow, generalized IMPRESSION: This study  showed episodes of eye opening and lip twitching, more prominent when stimulated consistent with myoclonic seizures.  Additionally EEG was suggestive of severe diffuse encephalopathy.  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 Critical care team and Dr. Cheral Marker was notified. Lora Havens   EEG adult  Result Date: 03/27/2021 Lora Havens, MD     03/27/2021  7:46 PM Patient Name: Billy Cherry MRN: [id number] Epilepsy Attending: Lora Havens Referring Physician/Provider: Derek Jack, MD Date: 03/27/2021 Duration: 22.26 mins Patient history: 51 year old gentleman with history of hypertension who is admitted after cardiac arrest. EEG to evaluate for seizure Level of alertness: lethargic AEDs during EEG study: LEV Technical aspects: This EEG study was done with scalp electrodes positioned according to the 10-20 International system of electrode placement. Electrical activity was acquired at a sampling rate of $Remov'500Hz'AghXWl$  and reviewed with a high frequency filter of $RemoveB'70Hz'LTRpmBgr$  and a low frequency filter of $RemoveB'1Hz'FqMCsXry$ . EEG data were recorded continuously and digitally stored. Description: EEG showed near continuous generalized 3 to 5 Hz theta-delta slowing admixed with 1-3 seconds of intermittent eeg attenuation. Generalized spikes were noted intermittently. Hyperventilation and photic stimulation were not performed.   ABNORMALITY - Spike,generalized - Continuous slow, generalized IMPRESSION: This study showed evidence of epileptogenicity with generalized onset. There is also moderate diffuse encephalopathy, nonspecific etiology. No seizures were seen throughout the recording. Lora Havens   EEG adult  Result Date: 03/23/2021 Derek Jack, MD     03/23/2021  7:35 PM Routine EEG Report Dilyn Smiles is a 51 y.o. male with a history of cardiac arrest who is undergoing an EEG to evaluate for seizures. Report: This EEG was acquired with electrodes placed according  to the International 10-20 electrode system (including Fp1, Fp2, F3, F4, C3, C4, P3, P4, O1, O2, T3, T4, T5, T6, A1, A2, Fz, Cz, Pz). The following electrodes were missing or displaced: none. There was no clear occipital dominant rhythm. The best background was low amplitude theta which was reactive to stimulation. At the beginning of the recording propofol had just been paused and there were intervening periods of diffuse suppression. Towards the end of the recording as propofol wore off background became near continuous. There was no focal slowing. There was no sleep architecture observed. There were no interictal epileptiform discharges. There were no electrographic seizures identified. Photic stimulation and hyperventilation were not performed.  Impression and clinical correlation: This EEG was obtained while comatose and is abnormal due to: - Moderate diffuse slowing indicative of global cerebral dysfunction - Initial burst suppression pattern which abated as propofol wore off and is favored to be largely (or entirely) secondary to medication effect Su Monks, MD [facility] [phone] If 7pm- 7am, please page neurology on call as listed in [place].   Overnight EEG with video  Result Date: 03/31/2021 Lora Havens, MD     04/01/2021  9:02 AM Patient Name: Billy Cherry MRN: [id number] Epilepsy Attending: Lora Havens Referring Physician/Provider: Dr Amie [place] Duration: 03/30/2021 1719 to 03/31/2021 1719  Patient history: 51 year old male status post cardiac arrest now noted to have eye twitching/remor like movements and rhythmic lip & tongue fasciculations.  EEG evaluate for seizure.  Level of alertness: comatose  AEDs during EEG study: LEV, VPA  Technical aspects: This EEG study was done with scalp electrodes positioned according to the 10-20 International system of electrode placement. Electrical activity was acquired at a sampling rate of $Remov'500Hz'ecEcro$  and reviewed with a high frequency  filter of $RemoveB'70Hz'RoOHZjJi$  and a low frequency filter of $RemoveB'1Hz'mFqcmpGD$ . EEG data were recorded continuously and digitally stored.  Description: EEG showed continuous generalized 5 to 7 Hz theta slowing.  Generalized periodic discharges were also noted at 1.5 to 2 Hz predominantly when patient was awake/stimulated.  This EEG is consistent with stimulation induced periodic discharges.  Patient was noted to have episodes of eye opening during the study, more prominent when stimulated. Concomitant EEG showed generalized spikes consistent with myoclonic seizure.  Hyperventilation and photic stimulation were not performed.    ABNORMALITY -Myoclonic seizure, generalized -Stimulation induced periodic discharges, generalized -Continuous slow, generalized  IMPRESSION: This study showed episodes of eye opening consistent with myoclonic seizures.  Additionally EEG suggestive of epileptogenicity with generalized onset and high potential for seizure recurrence.  Lastly, EEG was suggestive of severe diffuse encephalopathy.  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 Lora Havens   ECHOCARDIOGRAM COMPLETE  Result Date: 03/23/2021    ECHOCARDIOGRAM REPORT   Patient Name:   JIMIE KUWAHARA Date of Exam: 03/23/2021 Medical Rec #:  [id number]       Height:       71.0 in Accession #:    [id number]      Weight:       241.0 lb Date of Birth:  1970-09-19       BSA:          2.283 m Patient Age:    29 years        BP:           127/96 mmHg Patient Gender: M               HR:           105 bpm. Exam Location:  [facility] Procedure: 2D Echo, Color Doppler and Cardiac Doppler Indications:     I46.9 Cardiac arrest  History:         Patient has no prior history of Echocardiogram examinations.                  Risk Factors:Hypertension.  Sonographer:     Charmayne Sheer Referring Phys:  [place] Diagnosing Phys: Serafina Royals MD  Sonographer Comments: Echo performed with patient supine and on artificial  respirator. IMPRESSIONS  1. Left ventricular ejection fraction, by estimation, is <20%. The left ventricle has severely decreased  function. The left ventricle demonstrates global hypokinesis. The left ventricular internal cavity size was mildly dilated. There is mild left ventricular hypertrophy. Left ventricular diastolic parameters are consistent with Grade II diastolic dysfunction (pseudonormalization).  2. Right ventricular systolic function is normal. The right ventricular size is normal.  3. The mitral valve is normal in structure. Trivial mitral valve regurgitation.  4. The aortic valve is normal in structure. Aortic valve regurgitation is not visualized. FINDINGS  Left Ventricle: Left ventricular ejection fraction, by estimation, is <20%. The left ventricle has severely decreased function. The left ventricle demonstrates global hypokinesis. The left ventricular internal cavity size was mildly dilated. There is mild left ventricular hypertrophy. Left ventricular diastolic parameters are consistent with Grade II diastolic dysfunction (pseudonormalization). Right Ventricle: The right ventricular size is normal. No increase in right ventricular wall thickness. Right ventricular systolic function is normal. Left Atrium: Left atrial size was normal in size. Right Atrium: Right atrial size was normal in size. Pericardium: There is no evidence of pericardial effusion. Mitral Valve: The mitral valve is normal in structure. Trivial mitral valve regurgitation. MV peak gradient, 3.5 mmHg. The mean mitral valve gradient is 2.0 mmHg. Tricuspid Valve: The tricuspid valve is normal in structure. Tricuspid valve regurgitation is trivial. Aortic Valve: The aortic valve is normal in structure. Aortic valve regurgitation is not visualized. Aortic valve mean gradient measures 2.0 mmHg. Aortic valve peak gradient measures 3.7 mmHg. Aortic valve area, by VTI measures 3.03 cm. Pulmonic Valve: The pulmonic valve was normal in  structure. Pulmonic valve regurgitation is not visualized. Aorta: The aortic root and ascending aorta are structurally normal, with no evidence of dilitation. IAS/Shunts: No atrial level shunt detected by color flow Doppler.  LEFT VENTRICLE PLAX 2D LVIDd:         5.81 cm      Diastology LVIDs:         5.17 cm      LV e' medial:    4.68 cm/s LV PW:         1.52 cm      LV E/e' medial:  7.6 LV IVS:        1.01 cm      LV e' lateral:   3.26 cm/s LVOT diam:     2.30 cm      LV E/e' lateral: 10.9 LV SV:         39 LV SV Index:   17 LVOT Area:     4.15 cm  LV Volumes (MOD) LV vol d, MOD A2C: 219.0 ml LV vol d, MOD A4C: 198.0 ml LV vol s, MOD A2C: 156.0 ml LV vol s, MOD A4C: 153.0 ml LV SV MOD A2C:     63.0 ml LV SV MOD A4C:     198.0 ml LV SV MOD BP:      56.7 ml RIGHT VENTRICLE RV Basal diam:  3.99 cm RV S prime:     15.40 cm/s LEFT ATRIUM             Index        RIGHT ATRIUM           Index LA diam:        4.30 cm 1.88 cm/m   RA Area:     15.20 cm LA Vol (A2C):   55.4 ml 24.27 ml/m  RA Volume:   34.90 ml  15.29 ml/m LA Vol (A4C):   48.2 ml 21.11 ml/m LA Biplane Vol: 52.1 ml 22.82 ml/m  AORTIC VALVE                    PULMONIC VALVE AV Area (Vmax):    2.63 cm     PV Vmax:          0.87 m/s AV Area (Vmean):   2.53 cm     PV Vmean:         63.300 cm/s AV Area (VTI):     3.03 cm     PV VTI:           0.127 m AV Vmax:           96.50 cm/s   PV Peak grad:     3.0 mmHg AV Vmean:          67.100 cm/s  PV Mean grad:     2.0 mmHg AV VTI:            0.127 m      PR End Diast Vel: 6.66 msec AV Peak Grad:      3.7 mmHg AV Mean Grad:      2.0 mmHg LVOT Vmax:         61.10 cm/s LVOT Vmean:        40.800 cm/s LVOT VTI:          0.093 m LVOT/AV VTI ratio: 0.73  AORTA Ao Root diam: 3.30 cm MITRAL VALVE MV Area (PHT): 7.99 cm    SHUNTS MV Area VTI:   2.25 cm    Systemic VTI:  0.09 m MV Peak grad:  3.5 mmHg    Systemic Diam: 2.30 cm MV Mean grad:  2.0 mmHg MV Vmax:       0.94 m/s MV Vmean:      67.2 cm/s MV Decel Time: 95 msec  MV E velocity: 35.60 cm/s MV A velocity: 84.00 cm/s MV E/A ratio:  0.42 Serafina Royals MD Electronically signed by Serafina Royals MD Signature Date/Time: 03/23/2021/1:02:27 PM    Final     Labs:  CBC: Recent Labs    04/14/21 0138 04/15/21 0121 04/16/21 0051 04/17/21 0503  WBC 16.7* 11.7* 14.0* 18.4*  HGB 10.5* 10.0* 10.6* 11.5*  HCT 32.9* 32.4* 34.2* 37.1*  PLT 325 309 283 294    COAGS: Recent Labs    03/23/21 0254  INR 1.0  APTT 29    BMP: Recent Labs    04/14/21 0138 04/15/21 0121 04/16/21 0051 04/17/21 0503  NA 145 148* 150* 148*  K 4.6 5.0 4.1 3.9  CL 108 109 112* 112*  CO2 $Re'26 27 26 26  'GJQ$ GLUCOSE 134* 101* 138* 136*  BUN 60* 64* 71* 56*  CALCIUM 8.5* 8.5* 8.7* 8.6*  CREATININE 1.44* 1.45* 1.53* 1.27*  GFRNONAA 59* 59* 55* >60    LIVER FUNCTION TESTS: Recent Labs    04/14/21 0138 04/15/21 0121 04/16/21 0051 04/17/21 0503  BILITOT 0.6 0.3 0.2* 0.7  AST 36 35 39 34  ALT $Re'21 20 23 20  'lLm$ ALKPHOS 44 41 41 40  PROT 7.1 7.1 7.1 7.7  ALBUMIN 1.9* 2.0* 2.0* 2.1*     Assessment and Plan:  51 y.o. male inpatient. 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 Team is requesting gastrostomy tube placement for ongoing nutritional abscess.    CT abd pelvis shows stomach is accessible percutaneously.Patient trached on 2.11.23. On full vent settings.  OG tube present in left nare. WBC 18.4 up trending. BUN 56, Cr 1.27, magnesium 2.6, Ammonia 38. Blood cultures from 2.6.23 shows no growth in 5 days. Patient is a heparin gtt. No pertinent allergies.  IR consulted for possible gastrostomy tube placement. Case has been reviewed and procedure approved by Dr. Dwaine Gale.  Procedure schedule date to be determined based up on IR schedule and patient condition.    Team instructed to: Keep Patient to be NPO  after midnight Hold heparin gtt 4-6 hours prior to procedure ** Contrast to be give the night before per IR MD preference.   IR will call patient when ready.    Risks and benefits image guided gastrostomy tube placement was discussed with the patient including, but not limited to the need for a barium enema during the procedure, bleeding, infection, peritonitis and/or damage to adjacent structures.  All of the patient's wife's questions were answered, patient's wife is agreeable to proceed.  Consent signed and in IR Control Room.    Thank you for this interesting consult.  I greatly enjoyed meeting Nathon Stefanski and look forward to participating in their care.  A copy of this report was sent to the requesting provider on this date.  Electronically Signed: Jacqualine Mau, NP 04/17/2021, 3:13 PM   I spent a total of 40 Minutes    in face to face in clinical consultation, greater than 50% of which was counseling/coordinating care for gastrostomy tube placement

## 2021-04-17 NOTE — Progress Notes (Signed)
Pharmacy Antibiotic Note  Billy Cherry is a 51 y.o. male admitted on 03/30/2021 Vfib arrest d/t angioedema from ACEi and now concern for hospital-acquired pneumonia. WBC 17.4, tmax 101.5. 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 Literature review indicates vancomycin and minocycline as additional agents with high sensitivity, up to 100%. Discussed with MD. Pharmacy has been consulted for vancomycin dosing.  VP: 26 VT: 11 Levels drawn appropriately at correct time. Current dosing: vancomycin 1250mg  IV q24h  Goal AUC: 400-550  Plan: Continue vancomycin 1250mg  IV q24h (eAUC 441, Cmax 29.8, Cmin 10.3) with plans to end 2/16 (7 day course)  Height: 5\' 10"  (177.8 cm) Weight: 100 kg (220 lb 7.4 oz) IBW/kg (Calculated) : 73  Temp (24hrs), Avg:98.2 F (36.8 C), Min:97.8 F (36.6 C), Max:99.2 F (37.3 C)  Recent Labs  Lab 04/13/21 0210 04/14/21 0138 04/15/21 0121 04/16/21 0051 04/16/21 1507 04/17/21 0503 04/17/21 0917  WBC 17.4* 16.7* 11.7* 14.0*  --  18.4*  --   CREATININE 1.53* 1.44* 1.45* 1.53*  --  1.27*  --   VANCOTROUGH  --   --   --   --   --   --  11*  VANCOPEAK  --   --   --   --  26*  --   --      Estimated Creatinine Clearance: 82.5 mL/min (A) (by C-G formula based on SCr of 1.27 mg/dL (H)).    Allergies  Allergen Reactions   Lisinopril Swelling    angioedema    Antimicrobials this admission: CTX 1/21 >> 1/25 Cefepime 1/31 >> 2/2 >2/9 Ancef 2/2 >> 2/6 Vancomycin 2/9>> (7 days, to end 2/16)  Dose adjustments this admission:  Microbiology results: 1/31 TA - MSSA and Corynebacterium  2/6 Bcx NGTD 2/6 resp cx: flavobacterium odoratum (S cipro, bactrim, pip/tazo)  Thank you for allowing pharmacy to be a part of this patients care.  Levonne Spiller 04/17/2021 12:03 PM

## 2021-04-18 ENCOUNTER — Encounter (HOSPITAL_COMMUNITY): Payer: Self-pay | Admitting: Pulmonary Disease

## 2021-04-18 LAB — CBC WITH DIFFERENTIAL/PLATELET
Abs Immature Granulocytes: 0.09 10*3/uL — ABNORMAL HIGH (ref 0.00–0.07)
Basophils Absolute: 0 10*3/uL (ref 0.0–0.1)
Basophils Relative: 0 %
Eosinophils Absolute: 0.1 10*3/uL (ref 0.0–0.5)
Eosinophils Relative: 1 %
HCT: 36.7 % — ABNORMAL LOW (ref 39.0–52.0)
Hemoglobin: 11.7 g/dL — ABNORMAL LOW (ref 13.0–17.0)
Immature Granulocytes: 1 %
Lymphocytes Relative: 9 %
Lymphs Abs: 1.4 10*3/uL (ref 0.7–4.0)
MCH: 30.9 pg (ref 26.0–34.0)
MCHC: 31.9 g/dL (ref 30.0–36.0)
MCV: 96.8 fL (ref 80.0–100.0)
Monocytes Absolute: 1.3 10*3/uL — ABNORMAL HIGH (ref 0.1–1.0)
Monocytes Relative: 8 %
Neutro Abs: 12.2 10*3/uL — ABNORMAL HIGH (ref 1.7–7.7)
Neutrophils Relative %: 81 %
Platelets: 282 10*3/uL (ref 150–400)
RBC: 3.79 MIL/uL — ABNORMAL LOW (ref 4.22–5.81)
RDW: 13.7 % (ref 11.5–15.5)
WBC: 15 10*3/uL — ABNORMAL HIGH (ref 4.0–10.5)
nRBC: 0 % (ref 0.0–0.2)

## 2021-04-18 LAB — GLUCOSE, CAPILLARY
Glucose-Capillary: 117 mg/dL — ABNORMAL HIGH (ref 70–99)
Glucose-Capillary: 96 mg/dL (ref 70–99)
Glucose-Capillary: 94 mg/dL (ref 70–99)
Glucose-Capillary: 120 mg/dL — ABNORMAL HIGH (ref 70–99)
Glucose-Capillary: 93 mg/dL (ref 70–99)
Glucose-Capillary: 127 mg/dL — ABNORMAL HIGH (ref 70–99)

## 2021-04-18 MED ORDER — DOXAZOSIN MESYLATE 2 MG PO TABS
2.0000 mg | ORAL_TABLET | Freq: Every day | ORAL | Status: DC
  Administered 2021-04-18 – 2021-04-20 (×3): 2 mg via ORAL
  Filled 2021-04-18 (×4): qty 1

## 2021-04-18 MED ORDER — CHLORHEXIDINE GLUCONATE CLOTH 2 % EX PADS
6.0000 | MEDICATED_PAD | Freq: Every day | CUTANEOUS | Status: DC
  Administered 2021-04-18 – 2021-04-24 (×6): 6 via TOPICAL

## 2021-04-18 NOTE — Progress Notes (Signed)
Called pt wife Mrs. Bryd to notify of transfer to [facility] room 22 C. Given care update and new unit contact information.

## 2021-04-18 NOTE — Progress Notes (Signed)
Spoke with [employer] and they will sign off for now but if pt goes back on the ventilator, Notify them.  Spoke with IR charge RN to coordinate pt care for peg tube placement. The are putting the procedure on hold due to elevated WBC count

## 2021-04-18 NOTE — Progress Notes (Addendum)
NAME:  Billy Cherry, MRN:  [id number], DOB:  16-Jun-1970, LOS: 19 ADMISSION DATE:  03/30/2021, CONSULTATION DATE:  1/19 REFERRING MD:  Evette Georges, CHIEF COMPLAINT:  Found down, cardiac arrest   Brief Pt Description / Synopsis:  51 year old male with out-of-hospital V. fib cardiac arrest in setting of respiratory arrest due to suspected angioedema from ACE inhibitor leading to asphyxiation.  Concern on his 12 lead EKG for anterolateral ST elevation so he was taken to the cath lab where his study showed no significant coronary artery disease and an LVEF < 20%  Now withanoxic brain injury.  Pertinent  Medical History  Hypertension  Significant Hospital Events: Including procedures, antibiotic start and stop dates in addition to other pertinent events   1/19 admission, left heart cath without significant CAD.  Concern for anoxic brain injury 1/20: MRI pending, Neuro following 1/21: MRI shows areas of hypoxic-ischemic injury bilaterally. Fevered; cultures obtained. 1/23 severe brain damage 1/24 evidence of severe brain damage, remains critically ill 1/25: Plan for PEG today. Neuro exam unchanged.  Does exhibit new eyelid tremor like movements and lip/jaw tremor like vs clonus movement.  Bolused Keppra and increased maintenance dose.  Repeat EEG. CTH: concerning for progression of anoxic injury.  1/26: Repeat EEG unchanged and continues to show myoclonic seizures despite addition of Valproic acid yesterday. transfer to [facility] for continuous EEG 1/28: Continues to have myoclonic sz on EEG 1/29 No acute issues overnight 2/3 no meaningful neuro recovery. Diuresed 2/5 unchanged 2/6 New fever to 102, WBC 16K - treated with vancomycin for 7d based on cultures.  2/11: percutaneous tracheostomy  Interim History / Subjective:  Neurologically unchanged.  For PEG tube this week  Objective   Blood pressure 126/87, pulse 77, temperature 99.1 F (37.3 C), temperature source Oral, resp. rate 20, height  5\' 10"  (1.778 m), weight 96.9 kg, SpO2 100 %.    FiO2 (%):  [28 %] 28 %   Intake/Output Summary (Last 24 hours) at 04/18/2021 0818 Last data filed at 04/18/2021 0525 Gross per 24 hour  Intake 3120 ml  Output 1530 ml  Net 1590 ml     Filed Weights   04/13/21 1034 04/14/21 0423 04/18/21 0500  Weight: 99.2 kg 100 kg 96.9 kg   Examination:  General: on trach collar in no distress HEENT: tracheostomy site intact Chest: strong cough with tan secretions CV: HS normal, hypertensive Abdo: soft.  Extremities: no edema.  Neuro: eyes open, does not track or follow commands.   Ancillary tests personally reviewed:    Creatinine 1.24 WBC 18.4 Na: 148 Sputum MSSA and favobacterium odoratum (myroides) Assessment & Plan:  OOH Vfib arrest Post arrest anoxic brain injury, severe Post arrest myoclonus improved with klonopin, no AEDs recommended Post arrest respiratory failure- vent dependent due to mental status and secretions Post arrest cardiomyopathy- on GDMT Post arrest AKI, improved/stable HTN- stable on current regimen HCAP w/ MSSA- vanc should cover both after discussion with PharmD  Plan:   - Tracheostomy today, then proceed to trach collar trials - IR to see for PEG - vanc x 7 days (end date 2/16) - Continue klonipin, if breakthrough myoclonus okay to go up on this - Chest PT - added doxazosin for hypertension.  - ready for transfer to ward under hospitalist.    Best Practice (right click and "Reselect all SmartList Selections" daily)   Diet/type: tubefeeds DVT prophylaxis: heparin SQ GI prophylaxis: PPI Lines: N/A Foley:  Yes, and it is still needed Code Status:  full code  Last date of multidisciplinary goals of care discussion [1/10]  Lynnell Catalan, MD [facility] ICU Physician [facility] Critical Care  Pager: [phone] Or Epic Secure Chat After hours: [phone].  04/18/2021, 8:18 AM

## 2021-04-18 NOTE — Progress Notes (Signed)
NAME:  Billy Cherry, MRN:  [id number], DOB:  Nov 26, 1970, LOS: 19 ADMISSION DATE:  03/30/2021, CONSULTATION DATE:  1/19 REFERRING MD:  Evette Georges, CHIEF COMPLAINT:  Found down, cardiac arrest   Brief Pt Description / Synopsis:  51 year old male with out-of-hospital V. fib cardiac arrest in setting of respiratory arrest due to suspected angioedema from ACE inhibitor leading to asphyxiation.  Concern on his 12 lead EKG for anterolateral ST elevation so he was taken to the cath lab where his study showed no significant coronary artery disease and an LVEF < 20%  Now withanoxic brain injury.  Pertinent  Medical History  Hypertension  Significant Hospital Events: Including procedures, antibiotic start and stop dates in addition to other pertinent events   1/19 admission, left heart cath without significant CAD.  Concern for anoxic brain injury 1/20: MRI pending, Neuro following 1/21: MRI shows areas of hypoxic-ischemic injury bilaterally. Fevered; cultures obtained. 1/23 severe brain damage 1/24 evidence of severe brain damage, remains critically ill 1/25: Plan for PEG today. Neuro exam unchanged.  Does exhibit new eyelid tremor like movements and lip/jaw tremor like vs clonus movement.  Bolused Keppra and increased maintenance dose.  Repeat EEG. CTH: concerning for progression of anoxic injury.  1/26: Repeat EEG unchanged and continues to show myoclonic seizures despite addition of Valproic acid yesterday. transfer to [facility] for continuous EEG 1/28: Continues to have myoclonic sz on EEG 1/29 No acute issues overnight 2/3 no meaningful neuro recovery. Diuresed 2/5 unchanged 2/6 New fever to 102, WBC 16K - treated with vancomycin for 7d based on cultures.  2/11: percutaneous tracheostomy  Interim History / Subjective:  Neurologically unchanged.  For PEG tube this week  Objective   Blood pressure 126/87, pulse 77, temperature 99.1 F (37.3 C), temperature source Oral, resp. rate 20, height  5\' 10"  (1.778 m), weight 96.9 kg, SpO2 100 %.    FiO2 (%):  [28 %] 28 %   Intake/Output Summary (Last 24 hours) at 04/18/2021 0817 Last data filed at 04/18/2021 0525 Gross per 24 hour  Intake 3120 ml  Output 1530 ml  Net 1590 ml     Filed Weights   04/13/21 1034 04/14/21 0423 04/18/21 0500  Weight: 99.2 kg 100 kg 96.9 kg   Examination:  General: on trach collar in no distress HEENT: tracheostomy site intact Chest: strong cough with tan secretions CV: HS normal, hypertensive Abdo: soft.  Extremities: no edema.  Neuro: eyes open, does not track or follow commands.   Ancillary tests personally reviewed:    Creatinine 1.24 WBC 18.4 Na: 148 Sputum MSSA and favobacterium odoratum (myroides) Assessment & Plan:  OOH Vfib arrest Post arrest anoxic brain injury, severe Post arrest myoclonus improved with klonopin, no AEDs recommended Post arrest respiratory failure- vent dependent due to mental status and secretions Post arrest cardiomyopathy- on GDMT Post arrest AKI, improved/stable HTN- stable on current regimen HCAP w/ MSSA- vanc should cover both after discussion with PharmD  Plan:   - Tracheostomy today, then proceed to trach collar trials - IR to see for PEG - vanc x 7 days (end date 2/16) - Continue klonipin, if breakthrough myoclonus okay to go up on this - Chest PT - added amlodipine for hypertension.    Best Practice (right click and "Reselect all SmartList Selections" daily)   Diet/type: tubefeeds DVT prophylaxis: heparin SQ GI prophylaxis: PPI Lines: N/A Foley:  Yes, and it is still needed Code Status:  full code Last date of multidisciplinary goals of care discussion [1/10]  Lynnell Catalan, MD [facility] ICU Physician [facility]  Pager: [phone] Or Epic Secure Chat After hours: [phone].  04/18/2021, 8:17 AM

## 2021-04-18 NOTE — Progress Notes (Addendum)
Pt taken to room 3 east 22 on monitor and with RN and tech. Tolerated well. All belongings sent with pt. Bedside handoff completed with Adron Bene

## 2021-04-18 NOTE — Evaluation (Signed)
[facility] Speaking Valve - Evaluation Patient Details  Name: Billy Cherry MRN: [id number] Date of Birth: 12/08/1970  Today's Date: 04/18/2021 Time: [id number] SLP Time Calculation (min) (ACUTE ONLY): 20 min  Past Medical History:  Past Medical History:  Diagnosis Date   Hypertension    Past Surgical History:  Past Surgical History:  Procedure Laterality Date   BACK SURGERY     CORONARY/GRAFT ACUTE MI REVASCULARIZATION N/A 03/23/2021   Procedure: Coronary/Graft Acute MI Revascularization;  Surgeon: Isaias Cowman, MD;  Location: [place] [facility];  Service: Cardiovascular;  Laterality: N/A;   LEFT HEART CATH AND CORONARY ANGIOGRAPHY N/A 03/23/2021   Procedure: LEFT HEART CATH AND CORONARY ANGIOGRAPHY;  Surgeon: Isaias Cowman, MD;  Location: [place] [facility];  Service: Cardiovascular;  Laterality: N/A;   SHOULDER SURGERY     HPI:  Pt is a 51 y.o. male who presented 03/23/21 as a code STEMI after being found unresponsive. V. fib cardiac arrest in setting of respiratory arrest due to suspected angioedema from ACE inhibitor leading to asphyxiation.  Concern on his 12 lead EKG for anterolateral ST elevation so he was taken to the cath lab where his study showed no significant coronary artery disease and an LVEF < 20%. 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 S/p cortak placement 1/27, trach placement 2/11. ETT 1/19 - 2/11. PMH: HTN    Assessment / Plan / Recommendation  Clinical Impression  Pt resting quietly in bed. RN just finished tracheal suctioning; cuff deflated;  VS noted (Sp02 100%, RR 23; HR 80). No change upon cuff deflation.  Pt with poor responsiveness; no visual tracking.  PMV placed in effort to allow spontaneous voicing.  Immediate coughing elicited with secretions expelled both from trachea and orally. Coughing continued over several minutes  during donning and removal of valve.  Valve ultimately removed and cuff reinflated. Recommend ongoing trials of PMV with SLP and education with family so that it can be worn in their presence.  Purpose would be to establish effective cough and elicit some fundamental voicing.  SLP will follow. SLP Visit Diagnosis: Aphonia (R49.1)    SLP Assessment  Patient needs continued Speech [facility]ora Pathology Services    Recommendations for follow up therapy are one component of a multi-disciplinary discharge planning process, led by the attending physician.  Recommendations may be updated based on patient status, additional functional criteria and insurance authorization.  Follow Up Recommendations  SLP at Long-term acute care hospital    Assistance Recommended at Discharge Frequent or constant Supervision/Assistance  Functional Status Assessment    Frequency and Duration min 2x/week  2 weeks    PMSV Trial PMSV was placed for: 5 Able to redirect subglottic air through upper airway: Yes Able to Attain Phonation: No Able to Expectorate Secretions: Yes Level of Secretion Expectoration with PMSV: Oral;Tracheal Breath Support for Phonation: Inadequate Respirations During Trial: 22 SpO2 During Trial: 99 % Pulse During Trial: 82 Behavior: No attempt to communicate   Tracheostomy Tube  Additional Tracheostomy Tube Assessment Fenestrated: No    Vent Dependency  Vent Dependent: No FiO2 (%): 28 %    Cuff Deflation Trial Tolerated Cuff Deflation: Yes  Billy Cherry L. Tivis Ringer, [place] CCC/SLP Acute Rehabilitation Services Office number [phone] Pager [phone]        Billy Cherry 04/18/2021, 4:25 PM

## 2021-04-18 NOTE — Progress Notes (Signed)
Patient arrived onto the unit from [facility], Marchelle Folks RN assisted with transfer. Patient belongings stored. Tube feeding set up and initiated. VS protocol taken and stable. RT called to assist with set up and trach care - deep suction. Call bell within reach.

## 2021-04-18 NOTE — Plan of Care (Signed)

## 2021-04-19 DIAGNOSIS — Y95 Nosocomial condition: Secondary | ICD-10-CM

## 2021-04-19 DIAGNOSIS — J189 Pneumonia, unspecified organism: Secondary | ICD-10-CM

## 2021-04-19 DIAGNOSIS — J95851 Ventilator associated pneumonia: Secondary | ICD-10-CM

## 2021-04-19 LAB — CBC WITH DIFFERENTIAL/PLATELET
Abs Immature Granulocytes: 0.1 10*3/uL — ABNORMAL HIGH (ref 0.00–0.07)
Basophils Absolute: 0 10*3/uL (ref 0.0–0.1)
Basophils Relative: 0 %
Eosinophils Absolute: 0.1 10*3/uL (ref 0.0–0.5)
Eosinophils Relative: 1 %
HCT: 36 % — ABNORMAL LOW (ref 39.0–52.0)
Hemoglobin: 11.7 g/dL — ABNORMAL LOW (ref 13.0–17.0)
Immature Granulocytes: 1 %
Lymphocytes Relative: 10 %
Lymphs Abs: 1.4 10*3/uL (ref 0.7–4.0)
MCH: 31 pg (ref 26.0–34.0)
MCHC: 32.5 g/dL (ref 30.0–36.0)
MCV: 95.5 fL (ref 80.0–100.0)
Monocytes Absolute: 1.5 10*3/uL — ABNORMAL HIGH (ref 0.1–1.0)
Monocytes Relative: 11 %
Neutro Abs: 10.9 10*3/uL — ABNORMAL HIGH (ref 1.7–7.7)
Neutrophils Relative %: 77 %
Platelets: 233 10*3/uL (ref 150–400)
RBC: 3.77 MIL/uL — ABNORMAL LOW (ref 4.22–5.81)
RDW: 13.7 % (ref 11.5–15.5)
WBC: 14 10*3/uL — ABNORMAL HIGH (ref 4.0–10.5)
nRBC: 0 % (ref 0.0–0.2)

## 2021-04-19 LAB — BASIC METABOLIC PANEL
Anion gap: 11 (ref 5–15)
BUN: 56 mg/dL — ABNORMAL HIGH (ref 6–20)
CO2: 27 mmol/L (ref 22–32)
Calcium: 8.5 mg/dL — ABNORMAL LOW (ref 8.9–10.3)
Chloride: 114 mmol/L — ABNORMAL HIGH (ref 98–111)
Creatinine, Ser: 1.21 mg/dL (ref 0.61–1.24)
GFR, Estimated: >60 mL/min (ref >60)
Glucose, Bld: 120 mg/dL — ABNORMAL HIGH (ref 70–99)
Potassium: 3.8 mmol/L (ref 3.5–5.1)
Sodium: 152 mmol/L — ABNORMAL HIGH (ref 135–145)

## 2021-04-19 LAB — GLUCOSE, CAPILLARY
Glucose-Capillary: 110 mg/dL — ABNORMAL HIGH (ref 70–99)
Glucose-Capillary: 111 mg/dL — ABNORMAL HIGH (ref 70–99)
Glucose-Capillary: 115 mg/dL — ABNORMAL HIGH (ref 70–99)
Glucose-Capillary: 120 mg/dL — ABNORMAL HIGH (ref 70–99)
Glucose-Capillary: 122 mg/dL — ABNORMAL HIGH (ref 70–99)
Glucose-Capillary: 125 mg/dL — ABNORMAL HIGH (ref 70–99)

## 2021-04-19 MED ORDER — FREE WATER
400.0000 mL | Status: DC
  Administered 2021-04-19 – 2021-04-23 (×23): 400 mL

## 2021-04-19 NOTE — Assessment & Plan Note (Addendum)
Resolved with free water flushes

## 2021-04-19 NOTE — Progress Notes (Signed)
[facility] Progress Note  Patient: Billy Cherry     ZOX:[id number]  DOA: 03/30/2021      Date of Service: the patient was seen and examined on 04/19/2021  Brief hospital course: 51 y/o male with HTN who was found to be unresponsive by his wife. It tooke 5-10 min for EMS to arrive. EMS found him to be in V fib with agonal respirations. Defibrillated once and then went into asystole. Given 2 round of Epinephrineand had ROSC.  Futher history later obtained from wife that the patient had what appeared to be angioedema. This was assumed to have caused asphyxiation.   EKG after ROSC showed anterolateral ST elevation and he was taken to the cath lab. Dr Cassie Freer performed cath> insignificant CAD and EF 45-50%. Patient remained on ventilator after cath and was admitted to the ICU. Underwent TTM. Given Propofol.  1/19- neuro consulted- noted to have rhythmic twitching of his lips/tongue and was started on keppra   MRI brain> hypoxic ischemic injury involving bilateral ACA-MCA watershed cortex, bilat occipital cortex and BG ECHO showed EF of 20% with global hypokinesis. Treated for VAP.   1/25> showed episodes of eye opening and lip twitching, more prominent when stimulated consistent with myoclonic seizures.  Additionally EEG was suggestive of severe diffuse encephalopathy.  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  1/27- Neuro, Dr Melynda Ripple had GOC conversation with wife who stated that she wanted to continue aggressive care  2/2 palliative care note> wife did not show up for meeting- wife was called  and noted that she "hopes to push out any decisions regarding her husband treatment  until his mother/[name] comes to [place].  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- placed on  Vanc for 7 day course  2/11 trach placed  Subjective:  Non verbal  Assessment and Plan: Assessment and Plan: * Cardiac arrest ([name])- (present on admission) With resultant anoxic encephalopathy, respiratory failure, EF of < 20% - cont with trach - IR has been consulted for PEG tube placement - cont cardiac meds  Ventilator-acquired pneumonia ([name]) - cont Vanc to complete course  Hypernatremia - free water increased from 400 Q 6 to 400 Q 4 hrs- follow  Myoclonus- (present on admission) - cont AEDs and benzodiazepines per Neuro- also per neuro notes, suppressing his myoclonus will not ultimately change the course of illness       DVT prophylaxis: heparin injection 5,000 Units Start: 03/30/21 2000 Code Status:   Code Status: Full Code  Level of Care: Level of care: Med-Surg Disposition Plan:  Status is: Inpatient Remains inpatient appropriate because: needs PEG and then placement  Objective:   Vitals:   04/19/21 0840 04/19/21 1057 04/19/21 1137 04/19/21 1640  BP: 124/82 (!) 125/94    Pulse: 95 93 95 99  Resp:  (!) 22 (!) 24 (!) 29  Temp:  99.6 F (37.6 C)    TempSrc:  Axillary    SpO2: 91% 96% 95% 95%  Weight:      Height:       Filed Weights   04/14/21 0423 04/18/21 0500 04/19/21 0354  Weight: 100 kg 96.9 kg 100 kg   Exam: General exam: Appears comfortable - is obtunded and does not respond to pain HEENT:  no sclera icterus or  thrush- has cor trac Respiratory system: Clear to auscultation.  Cardiovascular system: S1 & S2 heard, regular rate and rhythm Gastrointestinal system: Abdomen soft, non-tender, nondistended. Normal bowel sounds   Central nervous system: obtunded Extremities: No cyanosis, clubbing or edema    Imaging and lab data Reviewed  CBC: Recent Labs  Lab 04/15/21 0121 04/16/21 0051 04/17/21 0503 04/18/21 0947 04/19/21 0449  WBC 11.7* 14.0* 18.4* 15.0* 14.0*  NEUTROABS  --   --   --  12.2* 10.9*  HGB 10.0* 10.6* 11.5* 11.7* 11.7*   HCT 32.4* 34.2* 37.1* 36.7* 36.0*  MCV 96.7 96.1 97.6 96.8 95.5  PLT 309 283 294 282 233   Basic Metabolic Panel: Recent Labs  Lab 04/13/21 0210 04/14/21 0138 04/15/21 0121 04/16/21 0051 04/17/21 0503 04/19/21 0449  NA 148* 145 148* 150* 148* 152*  K 4.9 4.6 5.0 4.1 3.9 3.8  CL 109 108 109 112* 112* 114*  CO2 30 26 27 26 26 27   GLUCOSE 132* 134* 101* 138* 136* 120*  BUN 59* 60* 64* 71* 56* 56*  CREATININE 1.53* 1.44* 1.45* 1.53* 1.27* 1.21  CALCIUM 8.6* 8.5* 8.5* 8.7* 8.6* 8.5*  MG 2.6* 2.5* 2.7* 2.6* 2.6*  --   PHOS 4.1 3.7 4.4 5.3* 3.8  --    GFR: Estimated Creatinine Clearance: 86.6 mL/min (by C-G formula based on SCr of 1.21 mg/dL).    Author:  04/19/2021 5:16 PM

## 2021-04-19 NOTE — Progress Notes (Addendum)
Nutrition Follow-up  DOCUMENTATION CODES:   Not applicable  INTERVENTION:  Tube Feeding via Cortrak:  Vital 1.5 at 60 ml/h (1440 ml per day) Prosource TF to 45 ml TID Provides 2280 kcal, 130 gm protein, 1100 ml free water daily   Current free water flush of 400 mL q 6 hours plus TF provides total free water: 2700 mL  - Recommend changing free water flushes of 400 mL from q 6 hours to q 4 hours to aid in hypernatremia  NUTRITION DIAGNOSIS:   Inadequate oral intake related to inability to eat as evidenced by NPO status.  ongoing  GOAL:   Patient will meet greater than or equal to 90% of their needs  ongoing  MONITOR:   Vent status, Labs, Weight trends, TF tolerance, I & O's  REASON FOR ASSESSMENT:   Ventilator, Consult Enteral/tube feeding initiation and management  ASSESSMENT:   51 year old male who originally presented to [facility] on 1/19 after out-of-hospital cardiac arrest due to suspected angioedema from ACE inhibitor leading to asphyxiation. PMH of HTN, HLD.  Per Palliative Care discussion (2/10), family would like to pursue full aggressive measures.  2/11: tracheostomy placed  Plans for PEG tube this week.  No family present during visit. Noted TF infusing at goal rate.   Ongoing hypernatremia. Pharmacy reached out to MD regarding changing free water flushes from every 6 hours to every 4 hours.   Current weight 100 kg; net + per I/O flow sheet   Pt with mild pitting generalized edema; moderate pitting BUE  Medications: fiber, SSI, protonix, IV abx Labs: sodium 152 (H), BUN 56, Mg (2/13) 2.6 (H),  CBG's 93-125 x24 hours  Diet Order:   Diet Order     None       EDUCATION NEEDS:   No education needs have been identified at this time  Skin:  Skin Assessment: Skin Integrity Issues: Skin Integrity Issues:: Other (Comment) Other: MASD buttocks  Last BM:  2/14  Height:   Ht Readings from Last 1 Encounters:  04/18/21 5\' 10"  (1.778 m)     Weight:   Wt Readings from Last 1 Encounters:  04/19/21 100 kg    Ideal Body Weight:  75.5 kg  BMI:  Body mass index is 31.63 kg/m.  Estimated Nutritional Needs:   Kcal:  2200-2400  Protein:  120-145 grams  Fluid:  >/= 2.2 L  04/21/21, RDN, LDN Clinical Nutrition

## 2021-04-19 NOTE — Assessment & Plan Note (Addendum)
Suspect primary VF arrest.  There is speculation about ACEi-induced angioedema.  No EMS or admitting providers noted angioedema at admission.    Complicated by anoxic encephalopathy and persistent vegetative state.  LHC without significant disease, EF 40-45%.  Follow up echocardiogram with LV systolic function <20%, global hypokinesis.  No significant valvular disease.

## 2021-04-19 NOTE — TOC Progression Note (Signed)
Transition of Care [facility]) - Progression Note    Patient Details  Name: Billy Cherry MRN: [id number] Date of Birth: 01/10/1971  Transition of Care [facility]) CM/SW Contact  Leone Haven, RN Phone Number: 04/19/2021, 2:55 PM  Clinical Narrative:    NCM referred patient to supervisor to refer to DTP.     Expected Discharge Plan: Long Term Acute Care (LTAC) Barriers to Discharge: Continued Medical Work up  Expected Discharge Plan and Services Expected Discharge Plan: Long Term Acute Care (LTAC) In-house Referral: Hospice / Palliative Care Discharge Planning Services: CM Consult Post Acute Care Choice: Long Term Acute Care (LTAC) Living arrangements for the past 2 months: Single Family Home                 DME Arranged: N/A                     Social Determinants of Health (SDOH) Interventions    Readmission Risk Interventions Readmission Risk Prevention Plan 04/14/2021  Transportation Screening Complete  HRI or Home Care Consult Complete  Social Work Consult for Recovery Care Planning/Counseling Complete  Palliative Care Screening Complete  Medication Review Ocean[employer]) Complete  Some recent data might be hidden

## 2021-04-19 NOTE — Progress Notes (Signed)
Spoke to his wife on the phone, She mentioned that this patient has password set up. Pls pass along next shift. See epic for Patient Password.

## 2021-04-19 NOTE — Assessment & Plan Note (Addendum)
Respiratory culture in Feb 2023 growing MSSA and Flaviobacterium.  Treated with 7 days antibiotic course.  Respiratory culture in March growing Pseudomonas, completed 7 days [place].   Treated third time in April with Unasyn.  Has had intermittent fever in May and June, likely microaspiration, no further antibiotics needed.

## 2021-04-19 NOTE — Plan of Care (Signed)

## 2021-04-19 NOTE — Hospital Course (Addendum)
Billy Cherry is a 51 y.o. M with HTN who presented after being found unresponsive with agonal respirations by his wife.    EMS found him at home with ventricular fibrillation cardiac arrest and respiratory arrest, gave defibrillation x1 -> asystole -> ROSC by arrival to ER after epinephrine.  STEMI noted on ECG.  Admitted to [facility] initially, emergent LHC showed normal coronaries, EF 40-45%.  Had minimal neurological recovery after ROSC, developed myoclonus and was transferred to [facility] for Neurology consultation.   Sequence of events during hospitalization,  1/19: Admitted to [facility] ICU, LHC without significant CAD. Poor neurological recovery, underwent TTM, Neuro consulted 1/20: Report by wife of "tongue swelling" during arrest prompts speculation of ACEi-angioedema as cause of cardiorespiratory arrest, although no EMS, ED or admitting providers noted tongue swelling 1/21: MRI shows areas of hypoxic-ischemic injury; new fever 1/23: New myoclonic movements 1/24: GI consulted for PEG 1/25: EEG with myoclonic seizures 1/26: EEG no change despite increased AEDs; transferred to [facility] from [place] for continuous EEG 1/28: Palliative consulted 2/6: New fever to 102, WBC 16K; Treated with vancomycin for 7d based on cultures.  2/11: Percutaneous tracheostomy by CCM 2/13-2/20: Weaned to trach collar 2/20: Trach change to 6 cuffless 2/22: PEG tube placed 3/7: ID consulted for Staph epidermidis; suspected to be blood culture contaminant 3/13: Doppler positive for left leg DVT, Eliquis started 4/4: CT of the chest with new PE and haziness consistent with pneumonia 4/5-4/11: Treated with Unasyn for aspiration pneumonia  6/21-6/27: Positive blood cultures (staph capitis/epidermidis), again, likely contamination. No antibiotics initiated.

## 2021-04-19 NOTE — Assessment & Plan Note (Addendum)
Seen by neurology.  Started on AE therapy with Klonopin, Keppra, valproic acid.

## 2021-04-19 NOTE — Progress Notes (Incomplete)
See Patient Password on Epic. Wife -Carlena Sax

## 2021-04-20 ENCOUNTER — Inpatient Hospital Stay (HOSPITAL_COMMUNITY): Payer: BC Managed Care – PPO

## 2021-04-20 DIAGNOSIS — A419 Sepsis, unspecified organism: Secondary | ICD-10-CM | POA: Insufficient documentation

## 2021-04-20 LAB — CBC WITH DIFFERENTIAL/PLATELET
Abs Immature Granulocytes: 0.16 10*3/uL — ABNORMAL HIGH (ref 0.00–0.07)
Basophils Absolute: 0 10*3/uL (ref 0.0–0.1)
Basophils Relative: 0 %
Eosinophils Absolute: 0.1 10*3/uL (ref 0.0–0.5)
Eosinophils Relative: 0 %
HCT: 35.2 % — ABNORMAL LOW (ref 39.0–52.0)
Hemoglobin: 11.5 g/dL — ABNORMAL LOW (ref 13.0–17.0)
Immature Granulocytes: 1 %
Lymphocytes Relative: 10 %
Lymphs Abs: 1.6 10*3/uL (ref 0.7–4.0)
MCH: 30.9 pg (ref 26.0–34.0)
MCHC: 32.7 g/dL (ref 30.0–36.0)
MCV: 94.6 fL (ref 80.0–100.0)
Monocytes Absolute: 1.8 10*3/uL — ABNORMAL HIGH (ref 0.1–1.0)
Monocytes Relative: 11 %
Neutro Abs: 12.7 10*3/uL — ABNORMAL HIGH (ref 1.7–7.7)
Neutrophils Relative %: 78 %
Platelets: 250 10*3/uL (ref 150–400)
RBC: 3.72 MIL/uL — ABNORMAL LOW (ref 4.22–5.81)
RDW: 13.5 % (ref 11.5–15.5)
WBC: 16.3 10*3/uL — ABNORMAL HIGH (ref 4.0–10.5)
nRBC: 0 % (ref 0.0–0.2)

## 2021-04-20 LAB — BASIC METABOLIC PANEL
Anion gap: 9 (ref 5–15)
BUN: 47 mg/dL — ABNORMAL HIGH (ref 6–20)
CO2: 25 mmol/L (ref 22–32)
Calcium: 8.1 mg/dL — ABNORMAL LOW (ref 8.9–10.3)
Chloride: 111 mmol/L (ref 98–111)
Creatinine, Ser: 1.16 mg/dL (ref 0.61–1.24)
GFR, Estimated: >60 mL/min (ref >60)
Glucose, Bld: 126 mg/dL — ABNORMAL HIGH (ref 70–99)
Potassium: 3.7 mmol/L (ref 3.5–5.1)
Sodium: 145 mmol/L (ref 135–145)

## 2021-04-20 LAB — COMPREHENSIVE METABOLIC PANEL
ALT: 22 U/L (ref 0–44)
ALT: 23 U/L (ref 0–44)
AST: 38 U/L (ref 15–41)
AST: 39 U/L (ref 15–41)
Albumin: 1.8 g/dL — ABNORMAL LOW (ref 3.5–5.0)
Albumin: 1.8 g/dL — ABNORMAL LOW (ref 3.5–5.0)
Alkaline Phosphatase: 36 U/L — ABNORMAL LOW (ref 38–126)
Alkaline Phosphatase: 40 U/L (ref 38–126)
Anion gap: 10 (ref 5–15)
Anion gap: 10 (ref 5–15)
BUN: 47 mg/dL — ABNORMAL HIGH (ref 6–20)
BUN: 47 mg/dL — ABNORMAL HIGH (ref 6–20)
CO2: 26 mmol/L (ref 22–32)
CO2: 26 mmol/L (ref 22–32)
Calcium: 8.3 mg/dL — ABNORMAL LOW (ref 8.9–10.3)
Calcium: 8.3 mg/dL — ABNORMAL LOW (ref 8.9–10.3)
Chloride: 112 mmol/L — ABNORMAL HIGH (ref 98–111)
Chloride: 111 mmol/L (ref 98–111)
Creatinine, Ser: 1.23 mg/dL (ref 0.61–1.24)
Creatinine, Ser: 1.2 mg/dL (ref 0.61–1.24)
GFR, Estimated: >60 mL/min (ref >60)
GFR, Estimated: >60 mL/min (ref >60)
Glucose, Bld: 131 mg/dL — ABNORMAL HIGH (ref 70–99)
Glucose, Bld: 148 mg/dL — ABNORMAL HIGH (ref 70–99)
Potassium: 3.8 mmol/L (ref 3.5–5.1)
Potassium: 3.9 mmol/L (ref 3.5–5.1)
Sodium: 148 mmol/L — ABNORMAL HIGH (ref 135–145)
Sodium: 147 mmol/L — ABNORMAL HIGH (ref 135–145)
Total Bilirubin: 0.3 mg/dL (ref 0.3–1.2)
Total Bilirubin: 0.2 mg/dL — ABNORMAL LOW (ref 0.3–1.2)
Total Protein: 6.9 g/dL (ref 6.5–8.1)
Total Protein: 6.8 g/dL (ref 6.5–8.1)

## 2021-04-20 LAB — LACTIC ACID, PLASMA
Lactic Acid, Venous: 1 mmol/L (ref 0.5–1.9)
Lactic Acid, Venous: 1.4 mmol/L (ref 0.5–1.9)

## 2021-04-20 LAB — GLUCOSE, CAPILLARY
Glucose-Capillary: 108 mg/dL — ABNORMAL HIGH (ref 70–99)
Glucose-Capillary: 110 mg/dL — ABNORMAL HIGH (ref 70–99)
Glucose-Capillary: 114 mg/dL — ABNORMAL HIGH (ref 70–99)
Glucose-Capillary: 123 mg/dL — ABNORMAL HIGH (ref 70–99)
Glucose-Capillary: 128 mg/dL — ABNORMAL HIGH (ref 70–99)
Glucose-Capillary: 148 mg/dL — ABNORMAL HIGH (ref 70–99)

## 2021-04-20 LAB — PROTIME-INR
INR: 1.3 — ABNORMAL HIGH (ref 0.8–1.2)
Prothrombin Time: 16.4 seconds — ABNORMAL HIGH (ref 11.4–15.2)

## 2021-04-20 LAB — APTT: aPTT: 32 seconds (ref 24–36)

## 2021-04-20 MED ORDER — SODIUM CHLORIDE 0.9 % IV SOLN
2.0000 g | Freq: Three times a day (TID) | INTRAVENOUS | Status: DC
  Administered 2021-04-20 – 2021-04-23 (×9): 2 g via INTRAVENOUS
  Filled 2021-04-20 (×10): qty 2

## 2021-04-20 NOTE — Progress Notes (Addendum)
Progress Note   Patient: Billy Cherry [id number]:[id number] DOB: 08/12/1970 DOA: 03/30/2021     21 DOS: the patient was seen and examined on 04/20/2021   Brief hospital course: 51 y/o male with HTN who was found to be unresponsive by his wife. It tooke 5-10 min for EMS to arrive. EMS found him to be in V fib with agonal respirations. Defibrillated once and then went into asystole. Given 2 round of Epinephrineand had ROSC.  Futher history later obtained from wife that the patient had what appeared to be angioedema. This was assumed to have caused asphyxiation.   EKG after ROSC showed anterolateral ST elevation and he was taken to the cath lab. Dr Cassie Freer performed cath> insignificant CAD and EF 45-50%. Patient remained on ventilator after cath and was admitted to the ICU. Underwent TTM. Given Propofol.  1/19- neuro consulted- noted to have rhythmic twitching of his lips/tongue and was started on keppra   MRI brain> hypoxic ischemic injury involving bilateral ACA-MCA watershed cortex, bilat occipital cortex and BG ECHO showed EF of 20% with global hypokinesis. Treated for VAP.   1/25> showed episodes of eye opening and lip twitching, more prominent when stimulated consistent with myoclonic seizures.  Additionally EEG was suggestive of severe diffuse encephalopathy.  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  1/27- Neuro, Dr Melynda Ripple had GOC conversation with wife who stated that she wanted to continue aggressive care  2/2 palliative care note> wife did not show up for meeting- wife was called  and noted that she "hopes to push out any decisions regarding her husband treatment  until his mother/[name] comes to [place].  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- placed on Vanc for 7 day  course  2/11 trach placed  Assessment and Plan: * Cardiac arrest ([name])- (present on admission) With resultant anoxic encephalopathy, respiratory failure, EF of < 20% - cont with trach - IR has been consulted for PEG tube placement - severe uncontrolled HTN likely contributed- cont Coreg, Isordil, amlodipine, Cardura, Hydralazine and PRN Labetalol  Ventilator-acquired pneumonia ([name]) Sepsis - completed 7 day of Vanc on 2/15 - having fevers as high at 101- lactic acid normal - WBC up to 16 from 14 - blood and sputum cultures ordered - CXR > LLL opacity improved- RLL opacity worse and now has RLL effusion - start Cefepime and monitor temp curve - likely has aspirated  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) - obtunded  Acute respiratory failure ([name])- (present on admission) On trach collar  Hypernatremia - free water increased from 400 Q 6 to 400 Q 4 hrs- follow - sodium improved- due to fevers, cont current plan  Myoclonus- (present on admission) - cont AEDs and benzodiazepines per Neuro- also per neuro notes, suppressing his myoclonus will not ultimately change the prognosis        Subjective: nonverbal  Physical Exam: Vitals:   04/20/21 0800 04/20/21 1000 04/20/21 1100 04/20/21 1115  BP: (!) 168/114 127/86 125/86   Pulse: 93 92 92   Resp: (!) 30 (!) 35 (!) 34   Temp: 99.9 F (37.7 C) (!) 101.2 F (38.4 C)    TempSrc: Oral Oral    SpO2: 97% 95% 96% 94%  Weight:      Height:  General exam: Appears comfortable - obtunded HEENT:  no sclera icterus or thrush- trach with pale yellow secretions  Respiratory system: diffuse rhonchi Cardiovascular system: S1 & S2 heard, regular rate and rhythm Gastrointestinal system: Abdomen soft, nondistended. Normal bowel sounds   Central nervous system: flaccid paralysis  Extremities: No cyanosis, clubbing or edema Skin: No rashes or ulcers   Data Reviewed:    CBC    Component Value Date/Time   WBC  16.3 (H) 04/20/2021 0816   RBC 3.72 (L) 04/20/2021 0816   HGB 11.5 (L) 04/20/2021 0816   HCT 35.2 (L) 04/20/2021 0816   PLT 250 04/20/2021 0816   MCV 94.6 04/20/2021 0816   MCH 30.9 04/20/2021 0816   MCHC 32.7 04/20/2021 0816   RDW 13.5 04/20/2021 0816   LYMPHSABS 1.6 04/20/2021 0816   MONOABS 1.8 (H) 04/20/2021 0816   EOSABS 0.1 04/20/2021 0816   BASOSABS 0.0 04/20/2021 0816   BMET    Component Value Date/Time   NA 148 (H) 04/20/2021 0816   K 3.8 04/20/2021 0816   CL 112 (H) 04/20/2021 0816   CO2 26 04/20/2021 0816   GLUCOSE 131 (H) 04/20/2021 0816   BUN 47 (H) 04/20/2021 0816   CREATININE 1.23 04/20/2021 0816   CALCIUM 8.3 (L) 04/20/2021 0816   GFRNONAA >60 04/20/2021 0816    Family Communication:  with his wife  Disposition: Status is: Inpatient Remains inpatient appropriate because: fevers- unsafe dc  Planned Discharge Destination:  TBD DVT Prophylaxis - Heparin injection 5,000 units         Author: Calvert Cantor, MD 04/20/2021 12:23 PM  For on call review [email or web address].

## 2021-04-20 NOTE — Assessment & Plan Note (Addendum)
Agonal breathing requiring intubation at admission.  Unable to wean from ventilator until after tracheostomy placed.

## 2021-04-20 NOTE — Progress Notes (Signed)
Awaiting labs result for this AM. PRN meds Given  04/20/21 0357  Assess: MEWS Score  Temp (!) 100.6 F (38.1 C)  BP (!) 148/115  Pulse Rate 96  ECG Heart Rate 96  Resp (!) 24  SpO2 95 %  O2 Device Tracheostomy Collar  O2 Flow Rate (L/min) 5 L/min  FiO2 (%) 28 %  Assess: MEWS Score  MEWS Temp 1  MEWS Systolic 0  MEWS Pulse 0  MEWS RR 1  MEWS LOC 2  MEWS Score 4  MEWS Score Color Red  Assess: if the MEWS score is Yellow or Red  Were vital signs taken at a resting state? Yes  Focused Assessment Change from prior assessment (see assessment flowsheet)  Early Detection of Sepsis Score *See Row Information* High  MEWS guidelines implemented *See Row Information* Yes  Treat  MEWS Interventions Administered scheduled meds/treatments  Pain Scale Faces  Pain Score 0  Notify: Charge Nurse/RN  Name of Charge Nurse/RN Notified [place] RN  Date Charge Nurse/RN Notified 04/20/21  Time Charge Nurse/RN Notified 0400

## 2021-04-20 NOTE — Progress Notes (Signed)
Speech Language Pathology Treatment: [name] Speaking valve  Patient Details Name: Shiro Ellerman MRN: [id number] DOB: 1970/04/03 Today's Date: 04/20/2021 Time: 3546-5681 SLP Time Calculation (min) (ACUTE ONLY): 8 min  Assessment / Plan / Recommendation Clinical Impression  Pt did not open his eyes or follow commands throughout this session. Nursing had just performed trach suction shortly before SLP arrival (estimated ~15 minutes), but audible secretions were still noted with PMV placement. Pt did also have secretions that appeared to be falling anteriorly out of his mouth. VS at baseline and during brief valve placement include RR in the low to mid 30s and SpO2 in the low 90s. Although VS were stable, pt had increase WOB within a few respiratory cycles after donning PMV and evidence of back pressure was observed upon PMV removal. Appearance overall is suggestive of incomplete upper airway patency. Valve was placed a few times for a several seconds at a time (<1 minute of use per placement) but a reflexive cough could not be elicited and he does not follow commands to try to volitionally cough and manage secretions. Recommend ongoing PMV use with SLP only. Although he may be able to tolerate PMV for longer intervals if he is able to transition at least to a cuffless trach, but mentation would also impact his ability to use it functionally.    HPI HPI: Pt is a 51 y.o. male who presented 03/23/21 as a code STEMI after being found unresponsive. V. fib cardiac arrest in setting of respiratory arrest due to suspected angioedema from ACE inhibitor leading to asphyxiation.  Concern on his 12 lead EKG for anterolateral ST elevation so he was taken to the cath lab where his study showed no significant coronary artery disease and an LVEF < 20%. 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 S/p cortak  placement 1/27, trach placement 2/11. ETT 1/19 - 2/11. PMH: HTN      SLP Plan  Continue with current plan of care      Recommendations for follow up therapy are one component of a multi-disciplinary discharge planning process, led by the attending physician.  Recommendations may be updated based on patient status, additional functional criteria and insurance authorization.    Recommendations         Patient may use Passy-Muir Speech Valve: with SLP only MD: Please consider changing trach tube to : Cuffless;Smaller size         Oral Care Recommendations: Oral care QID Follow Up Recommendations: SLP at Long-term acute care hospital Assistance recommended at discharge: Frequent or constant Supervision/Assistance SLP Visit Diagnosis: Aphonia (R49.1) Plan: Continue with current plan of care           Mahala Menghini., M.A. CCC-SLP Acute Rehabilitation Services Pager [phone] Office [phone]  04/20/2021, 9:53 AM

## 2021-04-20 NOTE — Progress Notes (Signed)
Occupational Therapy Treatment Patient Details Name: Billy Cherry MRN: [id number] DOB: 1970/08/22 Today's Date: 04/20/2021   History of present illness 51 y.o. male who presented 03/23/21 as a code STEMI after being found unresponsive. V. fib cardiac arrest in setting of respiratory arrest due to suspected angioedema from ACE inhibitor leading to asphyxiation.  Concern on his 12 lead EKG for anterolateral ST elevation so he was taken to the cath lab where his study showed no significant coronary artery disease and an LVEF < 20%. 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 S/p cortak placement 1/27, trach placement 2/11. ETT 1/19 - 2/11. PMH: HTN   OT comments  Pt continues to present with decreased arousal and engagement. Facilitating PROM of BUEs and cervical spine in supine. Total A for rolling and peri care after bowel incontinence. Facilitating scapular ROM in sidelying; limited due to increased secretions in sidelying. Facilitating trunk rotation in supine; x5 with holding position for 30 sec. Update frequency to 1xwk and will continue to follow acutely as admitted. Continue to recommend dc to LTACH.    Recommendations for follow up therapy are one component of a multi-disciplinary discharge planning process, led by the attending physician.  Recommendations may be updated based on patient status, additional functional criteria and insurance authorization.    Follow Up Recommendations  OT at Long-term acute care hospital    Assistance Recommended at Discharge Frequent or constant Supervision/Assistance  Patient can return home with the following  Other (comment) (total a)   Equipment Recommendations  None recommended by OT    Recommendations for Other Services      Precautions / Restrictions Precautions Precautions: Fall;Other (comment) Precaution Comments: trach; cortrak        Mobility Bed Mobility Overal bed mobility: Needs Assistance Bed Mobility: Rolling Rolling: Total assist         General bed mobility comments: Total A for rolling    Transfers                   General transfer comment: Defer     Balance                                           ADL either performed or assessed with clinical judgement   ADL Overall ADL's : Needs assistance/impaired     Grooming: Wash/dry face;Total assistance;Bed level Grooming Details (indicate cue type and reason): Total hand over hand for washing face                               General ADL Comments: Total A for ADLs    Extremity/Trunk Assessment Upper Extremity Assessment Upper Extremity Assessment: RUE deficits/detail;LUE deficits/detail RUE Deficits / Details: No active movement. No responce to noxious stimuli. slight edema. LUE Deficits / Details: No active movement. No responce to noxious stimuli. slight edema.   Lower Extremity Assessment Lower Extremity Assessment: Defer to PT evaluation        Vision       Perception     Praxis      Cognition Arousal/Alertness: Lethargic Behavior During Therapy: Flat affect Overall Cognitive Status: Difficult to assess  General Comments: Opening eyes when coughing or during rolling at bedlevel. No responce to painful stimuli.        Exercises Exercises: General Upper Extremity, Other exercises General Exercises - Upper Extremity Shoulder Flexion: PROM, Both, 10 reps, Supine Shoulder ABduction: PROM, Both, 10 reps, Supine Elbow Flexion: PROM, Both, 10 reps, Supine Elbow Extension: PROM, Both, 10 reps, Supine Wrist Flexion: PROM, Both, 10 reps Wrist Extension: PROM, Both, 10 reps, Supine Digit Composite Flexion: PROM, Both, 10 reps, Supine Composite Extension: PROM, Both, 10 reps, Supine Other Exercises Other Exercises: PROM of cervical spine;  supine; x5 Other Exercises: scapular retraction/protraction R shoulder in sidelying; x5 Other Exercises: trunk rotation in supine; x5    Shoulder Instructions       General Comments HR 90s. SpO2 maintaining in 90s on 5L at 28% FiO2; one epsidoe . BP at end of session    Pertinent Vitals/ Pain       Pain Assessment Pain Assessment: Faces Faces Pain Scale: No hurt Pain Intervention(s): Monitored during session  Home Living                                          Prior Functioning/Environment              Frequency  Min 1X/week        Progress Toward Goals  OT Goals(current goals can now be found in the care plan section)  Progress towards OT goals: Progressing toward goals  Acute Rehab OT Goals OT Goal Formulation: Patient unable to participate in goal setting Time For Goal Achievement: 05/01/21 Potential to Achieve Goals: Good ADL Goals Pt Will Perform Grooming: with max assist;bed level Additional ADL Goal #1: Pt's family will demonstrate understanding of ROM exercises at bedlevel Additional ADL Goal #2: Pt will demonstrate focused visual attention to object/face for 30 secs in preparation for ADLs  Plan Discharge plan remains appropriate;Frequency needs to be updated    Co-evaluation    PT/OT/SLP Co-Evaluation/Treatment: Yes Reason for Co-Treatment: To address functional/ADL transfers;For patient/therapist safety   OT goals addressed during session: Strengthening/ROM;ADL's and self-care      AM-PAC OT "6 Clicks" Daily Activity     Outcome Measure   Help from another person eating meals?: Total Help from another person taking care of personal grooming?: Total Help from another person toileting, which includes using toliet, bedpan, or urinal?: Total Help from another person bathing (including washing, rinsing, drying)?: Total Help from another person to put on and taking off regular upper body clothing?: Total Help from another  person to put on and taking off regular lower body clothing?: Total 6 Click Score: 6    End of Session    OT Visit Diagnosis: Unsteadiness on feet (R26.81);Other abnormalities of gait and mobility (R26.89);Muscle weakness (generalized) (M62.81)   Activity Tolerance Patient tolerated treatment well   Patient Left in bed;with call bell/phone within reach;with family/visitor present   Nurse Communication Mobility status        Time: 7628-3151 OT Time Calculation (min): 39 min  Charges: OT General Charges $OT Visit: 1 Visit OT Treatments $Self Care/Home Management : 23-37 mins  Georgette Helmer MSOT, OTR/L Acute Rehab Pager: [phone] Office: [phone]  Theodoro Grist Cortez Steelman 04/20/2021, 5:17 PM

## 2021-04-20 NOTE — Progress Notes (Signed)
Pharmacy Antibiotic Note  Billy Cherry is a 51 y.o. male admitted on 03/30/2021 with VF arrest 2/2 ACEi induced angioedema. 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 Pt now worsening, hharmacy has been consulted for cefepime dosing.  Plan: Cefepime 2g IV q8h  Height: 5\' 10"  (177.8 cm) Weight: 100 kg (220 lb 7.4 oz) IBW/kg (Calculated) : 73  Temp (24hrs), Avg:100.1 F (37.8 C), Min:99.3 F (37.4 C), Max:101.2 F (38.4 C)  Recent Labs  Lab 04/16/21 0051 04/16/21 1507 04/17/21 0503 04/17/21 0917 04/18/21 0947 04/19/21 0449 04/20/21 0447 04/20/21 0816  WBC 14.0*  --  18.4*  --  15.0* 14.0*  --  16.3*  CREATININE 1.53*  --  1.27*  --   --  1.21 1.16 1.23  LATICACIDVEN  --   --   --   --   --   --   --  1.0  VANCOTROUGH  --   --   --  11*  --   --   --   --   VANCOPEAK  --  26*  --   --   --   --   --   --     Estimated Creatinine Clearance: 85.2 mL/min (by C-G formula based on SCr of 1.23 mg/dL).    Allergies  Allergen Reactions   Lisinopril Swelling    angioedema    Antimicrobials this admission: CTX 1/21 >> 1/25 Cefepime 1/31 >> 2/2 >2/9; 2/16 >> Ancef 2/2 >> 2/6 Vancomycin 2/9 >> 2/15  Dose adjustments this admission: N/a  Microbiology results: 1/31 TA: MSSA 2/6 Bcx: negative 2/6 resp cx: flavobacterium odoratum (S cipro, bactrim, pip/tazo) and MSSA 2/16 BCx: sent  Thank you for allowing pharmacy to be a part of this patients care.  3/16, PharmD, BCPS, [facility] Clinical Pharmacist [phone] Please check AMION for all [facility] numbers 04/20/2021

## 2021-04-20 NOTE — Assessment & Plan Note (Addendum)
Poor neurological recovery after cardiac arrest.  Neurology consulted.  Developed myoclonic seizure activity quickly after arrest, which was difficult to manage.

## 2021-04-20 NOTE — Progress Notes (Signed)
Physical Therapy Treatment Patient Details Name: Billy Cherry MRN: [id number] DOB: 01-17-1971 Today's Date: 04/20/2021   History of Present Illness 51 y.o. male who presented 03/23/21 as a code STEMI after being found unresponsive. V. fib cardiac arrest in setting of respiratory arrest due to suspected angioedema from ACE inhibitor leading to asphyxiation.  Concern on his 12 lead EKG for anterolateral ST elevation so he was taken to the cath lab where his study showed no significant coronary artery disease and an LVEF < 20%. 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 S/p cortak placement 1/27, trach placement 2/11. ETT 1/19 - 2/11. PMH: HTN    PT Comments    The pt was in bed upon arrival, no family present. The pt tolerated rolling in bed as well as positioning in sidelying for PROM exercises and stretches with no change in HR or RR. The pt did not demo any response to PROM or changes in positioning other than brief eye opening that was not sustained and during which he did not visually track any stimuli. Given no change in status, will plan for additional PT visit for family education then plan to sign off due to lack of progress.     Recommendations for follow up therapy are one component of a multi-disciplinary discharge planning process, led by the attending physician.  Recommendations may be updated based on patient status, additional functional criteria and insurance authorization.  Follow Up Recommendations  PT at Long-term acute care hospital     Assistance Recommended at Discharge Frequent or constant Supervision/Assistance  Patient can return home with the following Two people to help with walking and/or transfers;Two people to help with bathing/dressing/bathroom;Assistance with cooking/housework;Assistance with feeding;Direct supervision/assist for medications  management;Direct supervision/assist for financial management;Assist for transportation;Help with stairs or ramp for entrance   Equipment Recommendations  Wheelchair (measurements PT);Wheelchair cushion (measurements PT);Hospital bed;Other (comment) (hoyer lift)    Recommendations for Other Services       Precautions / Restrictions Precautions Precautions: Fall;Other (comment) Precaution Comments: trach; cortrak Restrictions Weight Bearing Restrictions: No     Mobility  Bed Mobility Overal bed mobility: Needs Assistance Bed Mobility: Rolling Rolling: Total assist         General bed mobility comments: Total A for rolling    Transfers                   General transfer comment: pt with no response to rolling or sidelying position, or sitting with elevated HOB. no active movement in LE. unsafe to attempt           Balance Overall balance assessment: Needs assistance Sitting-balance support: No upper extremity supported, Feet unsupported Sitting balance-Leahy Scale: Zero Sitting balance - Comments: R lean and head tilt, no reactional strategies or adustments with sitting with HOB elevated to 50 deg Postural control: Right lateral lean                                  Cognition Arousal/Alertness: Lethargic Behavior During Therapy: Flat affect Overall Cognitive Status: Difficult to assess                                 General Comments: Opening eyes when coughing or during rolling at bedlevel. No responce to painful stimuli.  Exercises General Exercises - Upper Extremity Shoulder Flexion: PROM, Both, 10 reps, Supine Shoulder ABduction: PROM, Both, 10 reps, Supine Elbow Flexion: PROM, Both, 10 reps, Supine Elbow Extension: PROM, Both, 10 reps, Supine Wrist Flexion: PROM, Both, 10 reps Wrist Extension: PROM, Both, 10 reps, Supine Digit Composite Flexion: PROM, Both, 10 reps, Supine Composite Extension: PROM, Both, 10  reps, Supine Other Exercises Other Exercises: PROM of cervical spine; supine; x5 Other Exercises: scapular retraction/protraction R shoulder in sidelying; x5 Other Exercises: trunk rotation in supine; x5    General Comments General comments (skin integrity, edema, etc.): HR 90s. SpO2 maintaining in 90s on 5L at 28% FiO2; one epsidoe . BP at end of session      Pertinent Vitals/Pain Pain Assessment Pain Assessment: Faces Faces Pain Scale: No hurt Pain Intervention(s): Monitored during session     PT Goals (current goals can now be found in the care plan section) Acute Rehab PT Goals Patient Stated Goal: did not state PT Goal Formulation: Patient unable to participate in goal setting Time For Goal Achievement: 04/30/21 Potential to Achieve Goals: Poor Progress towards PT goals: Not progressing toward goals - comment (need to complete family training, pt with no change in responsiveness)    Frequency    Min 2X/week (for family training only)      PT Plan Frequency needs to be updated    Co-evaluation PT/OT/SLP Co-Evaluation/Treatment: Yes Reason for Co-Treatment: Complexity of the patient's impairments (multi-system involvement);Necessary to address cognition/behavior during functional activity;For patient/therapist safety;To address functional/ADL transfers PT goals addressed during session: Mobility/safety with mobility;Strengthening/ROM OT goals addressed during session: Strengthening/ROM;ADL's and self-care      AM-PAC PT "6 Clicks" Mobility   Outcome Measure  Help needed turning from your back to your side while in a flat bed without using bedrails?: Total Help needed moving from lying on your back to sitting on the side of a flat bed without using bedrails?: Total Help needed moving to and from a bed to a chair (including a wheelchair)?: Total Help needed standing up from a chair using your arms (e.g., wheelchair or bedside chair)?: Total Help needed to walk in  hospital room?: Total Help needed climbing 3-5 steps with a railing? : Total 6 Click Score: 6    End of Session Equipment Utilized During Treatment: Oxygen Activity Tolerance: Patient limited by lethargy Patient left: in bed;with call bell/phone within reach;with bed alarm set Nurse Communication: Mobility status PT Visit Diagnosis: Muscle weakness (generalized) (M62.81);Difficulty in walking, not elsewhere classified (R26.2);Other symptoms and signs involving the nervous system (R29.898);Adult, failure to thrive (R62.7)     Time: 2952-8413 PT Time Calculation (min) (ACUTE ONLY): 28 min  Charges:  $Therapeutic Exercise: 8-22 mins                     Vickki Muff, PT, DPT   Acute Rehabilitation Department Pager #: [phone]   Ronnie Derby 04/20/2021, 5:47 PM

## 2021-04-20 NOTE — Sepsis Progress Note (Signed)
[facility] is following this code sepsis ?

## 2021-04-21 LAB — CBC
HCT: 35.2 % — ABNORMAL LOW (ref 39.0–52.0)
Hemoglobin: 11.3 g/dL — ABNORMAL LOW (ref 13.0–17.0)
MCH: 30.7 pg (ref 26.0–34.0)
MCHC: 32.1 g/dL (ref 30.0–36.0)
MCV: 95.7 fL (ref 80.0–100.0)
Platelets: 216 10*3/uL (ref 150–400)
RBC: 3.68 MIL/uL — ABNORMAL LOW (ref 4.22–5.81)
RDW: 13.7 % (ref 11.5–15.5)
WBC: 14.9 10*3/uL — ABNORMAL HIGH (ref 4.0–10.5)
nRBC: 0 % (ref 0.0–0.2)

## 2021-04-21 LAB — GLUCOSE, CAPILLARY
Glucose-Capillary: 116 mg/dL — ABNORMAL HIGH (ref 70–99)
Glucose-Capillary: 123 mg/dL — ABNORMAL HIGH (ref 70–99)
Glucose-Capillary: 127 mg/dL — ABNORMAL HIGH (ref 70–99)
Glucose-Capillary: 128 mg/dL — ABNORMAL HIGH (ref 70–99)
Glucose-Capillary: 138 mg/dL — ABNORMAL HIGH (ref 70–99)
Glucose-Capillary: 92 mg/dL (ref 70–99)

## 2021-04-21 LAB — RESP PANEL BY RT-PCR (FLU A&B, COVID) ARPGX2
Influenza A by PCR: NEGATIVE
Influenza B by PCR: NEGATIVE
SARS Coronavirus 2 by RT PCR: NEGATIVE

## 2021-04-21 MED ORDER — DOXAZOSIN MESYLATE 2 MG PO TABS
2.0000 mg | ORAL_TABLET | Freq: Every day | ORAL | Status: DC
  Administered 2021-04-21 – 2021-09-27 (×160): 2 mg
  Filled 2021-04-21 (×161): qty 1

## 2021-04-21 MED ORDER — AMLODIPINE BESYLATE 10 MG PO TABS
10.0000 mg | ORAL_TABLET | Freq: Every day | ORAL | Status: DC
  Administered 2021-04-21 – 2021-05-26 (×36): 10 mg
  Filled 2021-04-21 (×36): qty 1

## 2021-04-21 MED ORDER — IBUPROFEN 100 MG/5ML PO SUSP
400.0000 mg | Freq: Four times a day (QID) | ORAL | Status: DC | PRN
  Administered 2021-04-21: 400 mg via ORAL
  Filled 2021-04-21 (×3): qty 20

## 2021-04-21 NOTE — Plan of Care (Signed)
° °  Problem: Clinical Measurements: Goal: Diagnostic test results will improve Outcome: Progressing   Problem: Clinical Measurements: Goal: Respiratory complications will improve Outcome: Progressing   Problem: Clinical Measurements: Goal: Cardiovascular complication will be avoided Outcome: Progressing   Problem: Nutrition: Goal: Adequate nutrition will be maintained Outcome: Progressing   Problem: Pain Managment: Goal: General experience of comfort will improve Outcome: Progressing   Problem: Safety: Goal: Ability to remain free from injury will improve Outcome: Progressing   Problem: Skin Integrity: Goal: Risk for impaired skin integrity will decrease Outcome: Progressing  Problem: Clinical Measurements: Goal: Ability to maintain clinical measurements within normal limits will improve Outcome: Progressing

## 2021-04-21 NOTE — Progress Notes (Signed)
[facility] Progress Note  Patient: Billy Cherry     OTL:[id number]  DOA: 03/30/2021      Date of Service: the patient was seen and examined on 04/21/2021  Brief hospital course: 51 y/o male with HTN who was found to be unresponsive by his wife. It tooke 5-10 min for EMS to arrive. EMS found him to be in V fib with agonal respirations. Defibrillated once and then went into asystole. Given 2 round of Epinephrineand had ROSC.  Futher history later obtained from wife that the patient had what appeared to be angioedema. This was assumed to have caused asphyxiation.   EKG after ROSC showed anterolateral ST elevation and he was taken to the cath lab. Dr Cassie Freer performed cath> insignificant CAD and EF 45-50%. Patient remained on ventilator after cath and was admitted to the ICU. Underwent TTM. Given Propofol.  1/19- neuro consulted- noted to have rhythmic twitching of his lips/tongue and was started on keppra   MRI brain> hypoxic ischemic injury involving bilateral ACA-MCA watershed cortex, bilat occipital cortex and BG ECHO showed EF of 20% with global hypokinesis. Treated for VAP.   1/25> showed episodes of eye opening and lip twitching, more prominent when stimulated consistent with myoclonic seizures.  Additionally EEG was suggestive of severe diffuse encephalopathy.  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  1/27- Neuro, Dr Melynda Ripple had GOC conversation with wife who stated that she wanted to continue aggressive care  2/2 palliative care note> wife did not show up for meeting- wife was called  and noted that she "hopes to push out any decisions regarding her husband treatment  until his mother/[name] comes to [place].  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- placed on  Vanc for 7 day course  2/11 trach placed  Subjective:  unresponsive  Assessment and Plan: Assessment and Plan: * Cardiac arrest ([name])- (present on admission) With resultant anoxic encephalopathy, respiratory failure, EF of < 20% -has tracheostomy and cor track - severe uncontrolled HTN likely contributed- cont Coreg, Isordil, amlodipine, Cardura, Hydralazine and PRN Labetalol  Ventilator-acquired pneumonia ([name]) Sepsis - completed 7 day of Vanc on 2/15 for MSSA and [name] -2/17>  having fevers as high at 101- lactic acid normal - WBC up to 16 from 14 - blood and sputum cultures ordered - CXR > LLL opacity improved- RLL opacity worse and now has RLL effusion - started Cefepime -- likely has aspirated - SARS COVID neg - still febrile-sputum culture growing abundant gram-negative rods and few gram-positive cocci in pairs  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) - obtunded  Acute respiratory failure ([name])- (present on admission) On trach collar  Hypernatremia - free water increased from 400 Q 6 to 400 Q 4 hrs- follow - sodium improved- due to fevers, cont current plan  Myoclonus- (present on admission) - cont AEDs and benzodiazepines per Neuro- also per neuro notes, suppressing his myoclonus will not ultimately change the prognosis  Nutrition: Continue tube feeds which are at goal-we will address PEG tube next week as long as fevers are controlled    Code Status: Full Code  Level of Care: Level of care: Progressive Disposition Plan:  Status is: Inpatient Remains inpatient appropriate because: -On IV antibiotics  Objective:   Vitals:   04/21/21 1000 04/21/21 1122 04/21/21 1123 04/21/21 1200  BP: 118/81 123/88 123/88 127/88  Pulse: 94 93 94 94  Resp: (!) 37 (!) 37 (!) 36 (!) 36  Temp: 100 F (37.8 C) (!) 101.1 F (38.4 C)    TempSrc: Axillary Axillary    SpO2: 96% 98% 98% 96%  Weight:      Height:       Filed  Weights   04/18/21 0500 04/19/21 0354 04/21/21 0500  Weight: 96.9 kg 100 kg 104.9 kg   Exam: General exam:  obtunded HEENT:   no sclera icterus or thrush- has pink tinged yellow secretions coming from trach and a trach collar- has cor track Respiratory system: rhonchi, elevated RR Cardiovascular system: S1 & S2 heard, regular rate and rhythm- tachycardic Gastrointestinal system: Abdomen soft, non-tender, nondistended. Normal bowel sounds   Extremities: No cyanosis, clubbing or edema Skin: No rashes or ulcers    Imaging and lab data Reviewed  CBC: Recent Labs  Lab 04/17/21 0503 04/18/21 0947 04/19/21 0449 04/20/21 0816 04/21/21 0353  WBC 18.4* 15.0* 14.0* 16.3* 14.9*  NEUTROABS  --  12.2* 10.9* 12.7*  --   HGB 11.5* 11.7* 11.7* 11.5* 11.3*  HCT 37.1* 36.7* 36.0* 35.2* 35.2*  MCV 97.6 96.8 95.5 94.6 95.7  PLT 294 282 233 250 216   Basic Metabolic Panel: Recent Labs  Lab 04/15/21 0121 04/16/21 0051 04/17/21 0503 04/19/21 0449 04/20/21 0447 04/20/21 0816 04/20/21 1330  NA 148* 150* 148* 152* 145 148* 147*  K 5.0 4.1 3.9 3.8 3.7 3.8 3.9  CL 109 112* 112* 114* 111 112* 111  CO2 27 26 26 27 25 26 26   GLUCOSE 101* 138* 136* 120* 126* 131* 148*  BUN 64* 71* 56* 56* 47* 47* 47*  CREATININE 1.45* 1.53* 1.27* 1.21 1.16 1.23 1.20  CALCIUM 8.5* 8.7* 8.6* 8.5* 8.1* 8.3* 8.3*  MG 2.7* 2.6* 2.6*  --   --   --   --   PHOS 4.4 5.3* 3.8  --   --   --   --    GFR: Estimated Creatinine Clearance: 89.4 mL/min (by C-G formula based on SCr of 1.2 mg/dL).    Author:  04/21/2021 12:16 PM

## 2021-04-21 NOTE — Progress Notes (Signed)
°   04/21/21 1122  Assess: MEWS Score  Temp (!) 101.1 F (38.4 C)  BP 123/88  Pulse Rate 93  Resp (!) 37  SpO2 98 %  O2 Device Tracheostomy Collar  Assess: MEWS Score  MEWS Temp 1  MEWS Systolic 0  MEWS Pulse 0  MEWS RR 3  MEWS LOC 0  MEWS Score 4  MEWS Score Color Red  Assess: if the MEWS score is Yellow or Red  Were vital signs taken at a resting state? Yes  Focused Assessment Change from prior assessment (see assessment flowsheet)  Early Detection of Sepsis Score *See Row Information* Medium  MEWS guidelines implemented *See Row Information* Yes  Treat  MEWS Interventions Administered scheduled meds/treatments  Pain Scale PAINAD  Pain Score 0  Faces Pain Scale 0  Notify: Charge Nurse/RN  Name of Charge Nurse/RN Notified Lafonda Mosses  Date Charge Nurse/RN Notified 04/21/21  Time Charge Nurse/RN Notified 1207  Notify: Provider  Provider Name/Title Dr Butler Denmark  Date Provider Notified 04/21/21  Time Provider Notified 1207  Notification Type Page  Notification Reason Change in status  Provider response No new orders  Date of Provider Response 04/21/21  Time of Provider Response 1207  Document  Patient Outcome Not stable and remains on department  Progress note created (see row info) Yes   Monitoring

## 2021-04-21 NOTE — TOC Progression Note (Signed)
Transition of Care [facility]) - Progression Note    Patient Details  Name: Billy Cherry MRN: [id number] Date of Birth: 1970/04/11  Transition of Care [facility]) CM/SW Contact  Leone Haven, RN Phone Number: 04/21/2021, 9:59 AM  Clinical Narrative:    NCM spoke with wife, she still wants aggressive care , she states she does not want patient to go to a Nursing Home.  She plans for patient to go home,  NCM informed her that family will be the main caregiver for patient if he goes home.  Also informed her she could get private duty care but will have to pay for that out of pocket.  NCM asked if she would like to get Medicaid process started, she stated yes.  NCM called financial counselor and left message to start Medicaid process.    Expected Discharge Plan: Long Term Acute Care (LTAC) Barriers to Discharge: Continued Medical Work up  Expected Discharge Plan and Services Expected Discharge Plan: Long Term Acute Care (LTAC) In-house Referral: Hospice / Palliative Care Discharge Planning Services: CM Consult Post Acute Care Choice: Long Term Acute Care (LTAC) Living arrangements for the past 2 months: Single Family Home                 DME Arranged: N/A                     Social Determinants of Health (SDOH) Interventions    Readmission Risk Interventions Readmission Risk Prevention Plan 04/14/2021  Transportation Screening Complete  HRI or Home Care Consult Complete  Social Work Consult for Recovery Care Planning/Counseling Complete  Palliative Care Screening Complete  Medication Review Oceanographer) Complete  Some recent data might be hidden

## 2021-04-22 LAB — CBC
HCT: 37.6 % — ABNORMAL LOW (ref 39.0–52.0)
Hemoglobin: 11.7 g/dL — ABNORMAL LOW (ref 13.0–17.0)
MCH: 30 pg (ref 26.0–34.0)
MCHC: 31.1 g/dL (ref 30.0–36.0)
MCV: 96.4 fL (ref 80.0–100.0)
Platelets: 197 10*3/uL (ref 150–400)
RBC: 3.9 MIL/uL — ABNORMAL LOW (ref 4.22–5.81)
RDW: 13.8 % (ref 11.5–15.5)
WBC: 14.6 10*3/uL — ABNORMAL HIGH (ref 4.0–10.5)
nRBC: 0 % (ref 0.0–0.2)

## 2021-04-22 LAB — GLUCOSE, CAPILLARY
Glucose-Capillary: 100 mg/dL — ABNORMAL HIGH (ref 70–99)
Glucose-Capillary: 108 mg/dL — ABNORMAL HIGH (ref 70–99)
Glucose-Capillary: 112 mg/dL — ABNORMAL HIGH (ref 70–99)
Glucose-Capillary: 116 mg/dL — ABNORMAL HIGH (ref 70–99)
Glucose-Capillary: 124 mg/dL — ABNORMAL HIGH (ref 70–99)
Glucose-Capillary: 99 mg/dL (ref 70–99)

## 2021-04-22 LAB — BASIC METABOLIC PANEL
Anion gap: 11 (ref 5–15)
BUN: 52 mg/dL — ABNORMAL HIGH (ref 6–20)
CO2: 27 mmol/L (ref 22–32)
Calcium: 8.2 mg/dL — ABNORMAL LOW (ref 8.9–10.3)
Chloride: 107 mmol/L (ref 98–111)
Creatinine, Ser: 1.19 mg/dL (ref 0.61–1.24)
GFR, Estimated: >60 mL/min (ref >60)
Glucose, Bld: 106 mg/dL — ABNORMAL HIGH (ref 70–99)
Potassium: 3.9 mmol/L (ref 3.5–5.1)
Sodium: 145 mmol/L (ref 135–145)

## 2021-04-22 NOTE — Progress Notes (Signed)
Progress Note   Patient: Billy Cherry IZT:[id number] DOB: 12-28-1970 DOA: 03/30/2021     23 DOS: the patient was seen and examined on 04/22/2021   Brief hospital course: 51 y/o male with HTN who was found to be unresponsive by his wife. It tooke 5-10 min for EMS to arrive. EMS found him to be in V fib with agonal respirations. Defibrillated once and then went into asystole. Given 2 round of Epinephrineand had ROSC.  Futher history later obtained from wife that the patient had what appeared to be angioedema. This was assumed to have caused asphyxiation.   EKG after ROSC showed anterolateral ST elevation and he was taken to the cath lab. Dr Cassie Freer performed cath> insignificant CAD and EF 45-50%. Patient remained on ventilator after cath and was admitted to the ICU. Underwent TTM. Given Propofol.  1/19- neuro consulted- noted to have rhythmic twitching of his lips/tongue and was started on keppra   MRI brain> hypoxic ischemic injury involving bilateral ACA-MCA watershed cortex, bilat occipital cortex and BG ECHO showed EF of 20% with global hypokinesis. Treated for VAP.   1/25> showed episodes of eye opening and lip twitching, more prominent when stimulated consistent with myoclonic seizures.  Additionally EEG was suggestive of severe diffuse encephalopathy.  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  1/27- Neuro, Dr Melynda Ripple had GOC conversation with wife who stated that she wanted to continue aggressive care  2/2 palliative care note> wife did not show up for meeting- wife was called  and noted that she "hopes to push out any decisions regarding her husband treatment  until his mother/Billy Cherry.  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- placed on Vanc for 7 day  course  2/11 trach placed  Assessment and Plan: * Cardiac arrest [facility])- (present on admission) With resultant anoxic encephalopathy, respiratory failure, EF of < 20% -has tracheostomy and cor track - severe uncontrolled HTN likely contributed- cont Coreg, Isordil, amlodipine, Cardura, Hydralazine and PRN Labetalol  Ventilator-acquired pneumonia ([name]) Sepsis - completed 7 day of Vanc on 2/15 for MSSA and [name] -2/17>  having fevers as high at 101- lactic acid normal - WBC up to 16 from 14 - blood and sputum cultures ordered - CXR > LLL opacity improved- RLL opacity worse and now has RLL effusion - started Cefepime -- likely has aspirated - SARS COVID neg -sputum culture growing abundant gram-negative rods and few gram-positive cocci in pairs -Fever resolved as of today, respiratory rate has improved  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) - obtunded  Acute respiratory failure ([name])- (present on admission) On trach collar  Hypernatremia - free water increased from 400 Q 6 to 400 Q 4 hrs- follow - sodium improved- -continue current dose of fluids  Myoclonus- (present on admission) - cont AEDs and benzodiazepines per Neuro- also per neuro notes, suppressing his myoclonus will not ultimately change the prognosis   Nutrition: As fever has resolved, will go home and request a PEG tube     Subjective: Nonverbal  Physical Exam: Vitals:   04/22/21 0401 04/22/21 0724 04/22/21 0814 04/22/21 1127  BP: (!) 166/105 (!) 179/119  132/80  Pulse: 87 87 (!) 101 84  Resp: (!) 24 (!) 24 (!) 22 (!) 29  Temp:  98.4 F (36.9 C)  98 F (36.7 C)  TempSrc:  Axillary  Axillary  SpO2: 100% 100% 100% 100%  Weight:      Height:       General exam: Obtunded HEENT: no sclera icterus or thrush-has trach and core track Respiratory system: Faint rhonchi Cardiovascular system: S1 & S2 heard, regular rate and rhythm Gastrointestinal system: Abdomen soft,  non-tender, nondistended. Normal bowel sounds   Central nervous system: Does not follow command or move Extremities: No cyanosis, clubbing or edema Skin: No rashes or ulcers   Data Reviewed:    Family Communication: With his wife  Disposition: Status is: Inpatient Remains inpatient appropriate because: Once PEG placed, he can likely be discharged          Planned Discharge Destination: Home with Home Health wife just told TOC that she would like to take him home     Time spent: 35 minutes  Author: Calvert Cantor, MD 04/22/2021 11:57 AM  For on call review [email or web address].

## 2021-04-23 LAB — BASIC METABOLIC PANEL
Anion gap: 10 (ref 5–15)
BUN: 42 mg/dL — ABNORMAL HIGH (ref 6–20)
CO2: 26 mmol/L (ref 22–32)
Calcium: 7.9 mg/dL — ABNORMAL LOW (ref 8.9–10.3)
Chloride: 107 mmol/L (ref 98–111)
Creatinine, Ser: 1.04 mg/dL (ref 0.61–1.24)
GFR, Estimated: >60 mL/min (ref >60)
Glucose, Bld: 109 mg/dL — ABNORMAL HIGH (ref 70–99)
Potassium: 4.2 mmol/L (ref 3.5–5.1)
Sodium: 143 mmol/L (ref 135–145)

## 2021-04-23 LAB — CULTURE, RESPIRATORY W GRAM STAIN

## 2021-04-23 LAB — GLUCOSE, CAPILLARY
Glucose-Capillary: 102 mg/dL — ABNORMAL HIGH (ref 70–99)
Glucose-Capillary: 108 mg/dL — ABNORMAL HIGH (ref 70–99)
Glucose-Capillary: 116 mg/dL — ABNORMAL HIGH (ref 70–99)
Glucose-Capillary: 121 mg/dL — ABNORMAL HIGH (ref 70–99)
Glucose-Capillary: 129 mg/dL — ABNORMAL HIGH (ref 70–99)
Glucose-Capillary: 132 mg/dL — ABNORMAL HIGH (ref 70–99)
Glucose-Capillary: 96 mg/dL (ref 70–99)

## 2021-04-23 LAB — CBC
HCT: 35.9 % — ABNORMAL LOW (ref 39.0–52.0)
Hemoglobin: 11.5 g/dL — ABNORMAL LOW (ref 13.0–17.0)
MCH: 30.3 pg (ref 26.0–34.0)
MCHC: 32 g/dL (ref 30.0–36.0)
MCV: 94.7 fL (ref 80.0–100.0)
Platelets: 222 10*3/uL (ref 150–400)
RBC: 3.79 MIL/uL — ABNORMAL LOW (ref 4.22–5.81)
RDW: 13.5 % (ref 11.5–15.5)
WBC: 13.1 10*3/uL — ABNORMAL HIGH (ref 4.0–10.5)
nRBC: 0.2 % (ref 0.0–0.2)

## 2021-04-23 MED ORDER — FREE WATER
400.0000 mL | Freq: Four times a day (QID) | Status: DC
  Administered 2021-04-23 – 2021-05-23 (×117): 400 mL

## 2021-04-23 MED ORDER — SODIUM CHLORIDE 0.9 % IV SOLN
1.0000 g | Freq: Three times a day (TID) | INTRAVENOUS | Status: AC
  Administered 2021-04-23 – 2021-04-25 (×8): 1 g via INTRAVENOUS
  Filled 2021-04-23 (×10): qty 1

## 2021-04-23 NOTE — Progress Notes (Signed)
°   04/23/21 1207  Assess: MEWS Score  Level of Consciousness Alert  Assess: MEWS Score  MEWS Temp 0  MEWS Systolic 0  MEWS Pulse 0  MEWS RR 2  MEWS LOC 0  MEWS Score 2  MEWS Score Color Yellow  Assess: if the MEWS score is Yellow or Red  Were vital signs taken at a resting state? Yes  Focused Assessment Change from prior assessment (see assessment flowsheet)  Early Detection of Sepsis Score *See Row Information* Low  MEWS guidelines implemented *See Row Information* Yes  Treat  MEWS Interventions Administered scheduled meds/treatments  Pain Scale 0-10  Pain Score 0  Notify: Charge Nurse/RN  Name of Charge Nurse/RN Notified Michele Rockers  Date Charge Nurse/RN Notified 04/23/21  Time Charge Nurse/RN Notified 1219  Notify: Provider  Provider Name/Title Dr Butler Denmark  Date Provider Notified 04/23/21  Notification Type Page  Notification Reason Change in status  Provider response No new orders  Document  Patient Outcome Stabilized after interventions  Progress note created (see row info) Yes

## 2021-04-23 NOTE — Progress Notes (Signed)
°   04/23/21 1119  Assess: MEWS Score  BP 117/76  Pulse Rate 85  Resp (!) 28  SpO2 100 %  O2 Device Tracheostomy Collar  Assess: MEWS Score  MEWS Temp 0  MEWS Systolic 0  MEWS Pulse 0  MEWS RR 2  MEWS LOC 0  MEWS Score 2  MEWS Score Color Yellow  Treat  MEWS Interventions Administered scheduled meds/treatments  Pain Scale 0-10  Pain Score 0  Notify: Charge Nurse/RN  Name of Charge Nurse/RN Notified Londra  Date Charge Nurse/RN Notified 04/23/21  Time Charge Nurse/RN Notified 1219  Notify: Provider  Provider Name/Title Dr Butler Denmark  Provider response No new orders  Document  Patient Outcome Other (Comment) (Monitoring)  Progress note created (see row info) Yes

## 2021-04-23 NOTE — Progress Notes (Addendum)
Pharmacy Antibiotic Note  Billy Cherry is a 51 y.o. male admitted on 03/30/2021 with VF arrest 2/2 ACEi induced angioedema. Pt treated for PNA due to flavobacterium and MSSA growing in tracheal aspirate with vancomycin x7 days. Pt now worsening, pharmacy has been consulted for cefepime dosing.  Day 4 Cefepime Tm 101.1 on 2/17 >> afebrile since WBC trending down 16.3 > 14.6 > 13.1 Scr 1.04 at baseline, stable  Plan: Stop cefepime Start ceftazidime 1 gm IV Q8H through 2/21 Monitor renal function, clinical improvement  Height: 5\' 10"  (177.8 cm) Weight: 106 kg (233 lb 11 oz) IBW/kg (Calculated) : 73  Temp (24hrs), Avg:99.1 F (37.3 C), Min:98 F (36.7 C), Max:99.5 F (37.5 C)  Recent Labs  Lab 04/16/21 1507 04/17/21 0503 04/17/21 0917 04/18/21 0947 04/19/21 0449 04/20/21 0447 04/20/21 0816 04/20/21 1107 04/20/21 1330 04/21/21 0353 04/22/21 0417 04/23/21 0517  WBC  --    < >  --    < > 14.0*  --  16.3*  --   --  14.9* 14.6* 13.1*  CREATININE  --    < >  --   --  1.21 1.16 1.23  --  1.20  --  1.19 1.04  LATICACIDVEN  --   --   --   --   --   --  1.0 1.4  --   --   --   --   VANCOTROUGH  --   --  11*  --   --   --   --   --   --   --   --   --   VANCOPEAK 26*  --   --   --   --   --   --   --   --   --   --   --    < > = values in this interval not displayed.     Estimated Creatinine Clearance: 103.6 mL/min (by C-G formula based on SCr of 1.04 mg/dL).    Allergies  Allergen Reactions   Lisinopril Swelling    angioedema    Antimicrobials this admission: CTX 1/21 >> 1/25 Cefepime 1/31 >> 2/2 >2/9; 2/16 >> 2/19 Ancef 2/2 >> 2/6 Vancomycin 2/9 >> 2/15 Ceftazidime 2/19 > [2/21]  Dose adjustments this admission: N/a  Microbiology results: 1/31 TA: MSSA 2/6 Bcx: negative 2/6 resp cx: flavobacterium odoratum (S cipro, bactrim, pip/tazo) and MSSA 2/16 BCx: NG < 3d 2/16 TA: abundant PsA (pan sensitive)  Thank you for allowing pharmacy to be a part of this  patients care.  3/16, PharmD PGY1 Pharmacy Resident 04/23/2021  9:58 AM  Please check [email or web address] for unit-specific pharmacy phone numbers.

## 2021-04-23 NOTE — Progress Notes (Signed)
Progress Note   Patient: Billy Cherry SNK:[id number] DOB: 1970-06-25 DOA: 03/30/2021     24 DOS: the patient was seen and examined on 04/23/2021   Brief hospital course: 51 y/o male with HTN who was found to be unresponsive by his wife. It tooke 5-10 min for EMS to arrive. EMS found him to be in V fib with agonal respirations. Defibrillated once and then went into asystole. Given 2 round of Epinephrineand had ROSC.  Futher history later obtained from wife that the patient had what appeared to be angioedema. This was assumed to have caused asphyxiation.   EKG after ROSC showed anterolateral ST elevation and he was taken to the cath lab. Dr Cassie Freer performed cath> insignificant CAD and EF 45-50%. Patient remained on ventilator after cath and was admitted to the ICU. Underwent TTM. Given Propofol.  1/19- neuro consulted- noted to have rhythmic twitching of his lips/tongue and was started on keppra   MRI brain> hypoxic ischemic injury involving bilateral ACA-MCA watershed cortex, bilat occipital cortex and BG ECHO showed EF of 20% with global hypokinesis. Treated for VAP.   1/25> showed episodes of eye opening and lip twitching, more prominent when stimulated consistent with myoclonic seizures.  Additionally EEG was suggestive of severe diffuse encephalopathy.  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  1/27- Neuro, Dr Melynda Ripple had GOC conversation with wife who stated that she wanted to continue aggressive care  2/2 palliative care note> wife did not show up for meeting- wife was called  and noted that she "hopes to push out any decisions regarding her husband treatment  until his mother/[name] comes to [place].  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- placed on Vanc for 7 day  course  2/11 trach placed  Assessment and Plan: * Cardiac arrest [facility])- (present on admission) With resultant anoxic encephalopathy, respiratory failure, EF of < 20% -has tracheostomy and cor track - severe uncontrolled HTN likely contributed- cont Coreg, Isordil, amlodipine, Cardura, Hydralazine and PRN Labetalol  RLL pneumonia Sepsis - completed 7 day of Vanc on 2/15 for MSSA and [name] -2/16>  having fevers as high at 101- lactic acid normal, RR in 30-40s, tachycardic, large amounts of yellow and bloody sputum - WBC up to 16 from 14 - blood and sputum cultures ordered -2/16 CXR > LLL opacity improved- RLL opacity worse and now has RLL effusion - started Cefepime -- likely has aspirated - SARS COVID neg -sputum culture growing pseudomonas aeruginosa -Fever resolved as of 2/18 and respiratory rate has improved -sensitive to Ceftaz- recommend 7 days with last dose to be on 2/21  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) - obtunded  Acute respiratory failure ([name])- (present on admission) On trach collar  Hypernatremia - free water increased from 400 Q 6 to 400 Q 4 hrs- follow - sodium improved- - as fever resolved and RR improved, will reduce back to 400 cc Q 6 hrs  Myoclonus- (present on admission) - cont AEDs and benzodiazepines per Neuro- also per neuro notes, suppressing his myoclonus will not ultimately change the prognosis        Subjective: nonverbal  Physical Exam: Vitals:   04/23/21 0547 04/23/21 0728 04/23/21 1000 04/23/21 1119  BP:  (!) 156/107 123/86 117/76  Pulse:  91 85  85  Resp:  (!) 24 (!) 25 (!) 28  Temp:  99.4 F (37.4 C)    TempSrc:  Axillary    SpO2:  100% 100% 100%  Weight: 106 kg     Height:       General exam: Appears comfortable  HEENT:  no sclera icterus or thrush- on trach collar-  Respiratory system: Clear to auscultation. Respiratory effort normal. Cardiovascular system: S1 & S2 heard, regular rate  and rhythm Gastrointestinal system: Abdomen soft, non-tender, nondistended. Normal bowel sounds   Central nervous system: obtunded, flaccid paralysis Extremities: No cyanosis, clubbing or edema Skin: No rashes or ulcers    Data Reviewed:    BMET    Component Value Date/Time   NA 143 04/23/2021 0517   K 4.2 04/23/2021 0517   CL 107 04/23/2021 0517   CO2 26 04/23/2021 0517   GLUCOSE 109 (H) 04/23/2021 0517   BUN 42 (H) 04/23/2021 0517   CREATININE 1.04 04/23/2021 0517   CALCIUM 7.9 (L) 04/23/2021 0517   GFRNONAA >60 04/23/2021 0517   CBC    Component Value Date/Time   WBC 13.1 (H) 04/23/2021 0517   RBC 3.79 (L) 04/23/2021 0517   HGB 11.5 (L) 04/23/2021 0517   HCT 35.9 (L) 04/23/2021 0517   PLT 222 04/23/2021 0517   MCV 94.7 04/23/2021 0517   MCH 30.3 04/23/2021 0517   MCHC 32.0 04/23/2021 0517   RDW 13.5 04/23/2021 0517   LYMPHSABS 1.6 04/20/2021 0816   MONOABS 1.8 (H) 04/20/2021 0816   EOSABS 0.1 04/20/2021 0816   BASOSABS 0.0 04/20/2021 0816    Family Communication: wife   Disposition: Status is: Inpatient Remains inpatient appropriate because: needs PEG and needs to complete antibiotic course and then can be discharged as long as he is stable          Planned Discharge Destination:  TBD, his wife does not want to dicuss this yet     Time spent: 35 minutes  Author: Calvert Cantor, MD 04/23/2021 12:42 PM  For on call review [email or web address].

## 2021-04-24 DIAGNOSIS — I10 Essential (primary) hypertension: Secondary | ICD-10-CM

## 2021-04-24 LAB — CBC
HCT: 34.3 % — ABNORMAL LOW (ref 39.0–52.0)
Hemoglobin: 11.3 g/dL — ABNORMAL LOW (ref 13.0–17.0)
MCH: 31.2 pg (ref 26.0–34.0)
MCHC: 32.9 g/dL (ref 30.0–36.0)
MCV: 94.8 fL (ref 80.0–100.0)
Platelets: 198 10*3/uL (ref 150–400)
RBC: 3.62 MIL/uL — ABNORMAL LOW (ref 4.22–5.81)
RDW: 13.6 % (ref 11.5–15.5)
WBC: 12.5 10*3/uL — ABNORMAL HIGH (ref 4.0–10.5)
nRBC: 0 % (ref 0.0–0.2)

## 2021-04-24 LAB — BASIC METABOLIC PANEL
Anion gap: 10 (ref 5–15)
BUN: 37 mg/dL — ABNORMAL HIGH (ref 6–20)
CO2: 26 mmol/L (ref 22–32)
Calcium: 8 mg/dL — ABNORMAL LOW (ref 8.9–10.3)
Chloride: 107 mmol/L (ref 98–111)
Creatinine, Ser: 0.95 mg/dL (ref 0.61–1.24)
GFR, Estimated: >60 mL/min (ref >60)
Glucose, Bld: 115 mg/dL — ABNORMAL HIGH (ref 70–99)
Potassium: 4 mmol/L (ref 3.5–5.1)
Sodium: 143 mmol/L (ref 135–145)

## 2021-04-24 LAB — GLUCOSE, CAPILLARY
Glucose-Capillary: 114 mg/dL — ABNORMAL HIGH (ref 70–99)
Glucose-Capillary: 104 mg/dL — ABNORMAL HIGH (ref 70–99)
Glucose-Capillary: 104 mg/dL — ABNORMAL HIGH (ref 70–99)

## 2021-04-24 MED ORDER — LIP MEDEX EX OINT
1.0000 "application " | TOPICAL_OINTMENT | CUTANEOUS | Status: DC | PRN
  Administered 2021-04-24 – 2021-06-19 (×15): 1 via TOPICAL
  Filled 2021-04-24: qty 7

## 2021-04-24 NOTE — Progress Notes (Signed)
Mobility Specialist Progress Note:   04/24/21 1540  Mobility  Activity Turned to right side;Turned to left side  Level of Assistance Total care  Assistive Device None  Activity Response Tolerated well  Transport method Ambulatory  $Mobility charge 1 Mobility   RN requesting assistance with rolling pt. Left pt in bed with mother in room.  Nelta Numbers Acute Rehab Phone: [phone] Office Phone: [phone]

## 2021-04-24 NOTE — Progress Notes (Signed)
NAME:  Billy Cherry, MRN:  [id number], DOB:  October 25, 1970, LOS: 25 ADMISSION DATE:  03/30/2021, CONSULTATION DATE:  1/19 REFERRING MD:  Evette Georges, CHIEF COMPLAINT:  Found down, cardiac arrest   Brief Pt Description / Synopsis:  51 year old male with out-of-hospital V. fib cardiac arrest in setting of respiratory arrest due to suspected angioedema from ACE inhibitor leading to asphyxiation.  Concern on his 12 lead EKG for anterolateral ST elevation so he was taken to the cath lab where his study showed no significant coronary artery disease and an LVEF < 20%  Now with anoxic brain injury.  Pertinent  Medical History  Hypertension  Significant Hospital Events: Including procedures, antibiotic start and stop dates in addition to other pertinent events   1/19 admission, left heart cath without significant CAD.  Concern for anoxic brain injury 1/20: MRI pending, Neuro following 1/21: MRI shows areas of hypoxic-ischemic injury bilaterally. Fevered; cultures obtained. 1/23 severe brain damage 1/24 evidence of severe brain damage, remains critically ill 1/25: Plan for PEG today. Neuro exam unchanged.  Does exhibit new eyelid tremor like movements and lip/jaw tremor like vs clonus movement.  Bolused Keppra and increased maintenance dose.  Repeat EEG. CTH: concerning for progression of anoxic injury.  1/26: Repeat EEG unchanged and continues to show myoclonic seizures despite addition of Valproic acid yesterday. transfer to [facility] for continuous EEG 1/28: Continues to have myoclonic sz on EEG 1/29 No acute issues overnight 2/3 no meaningful neuro recovery. Diuresed 2/5 unchanged 2/6 New fever to 102, WBC 16K - treated with vancomycin for 7d based on cultures.  2/11: percutaneous tracheostomy 2/13 >2/20 on trach collar. No vent.  2/20 Trach change to 6 cuffless  Interim History / Subjective:  Has been doing well on trach collar. Has not been on the vent for the past week.   Objective   Blood  pressure 129/84, pulse 91, temperature 99.2 F (37.3 C), resp. rate 18, height 5\' 10"  (1.778 m), weight 106.2 kg, SpO2 98 %.    FiO2 (%):  [28 %] 28 %   Intake/Output Summary (Last 24 hours) at 04/24/2021 0959 Last data filed at 04/24/2021 0400 Gross per 24 hour  Intake 200 ml  Output 700 ml  Net -500 ml     Filed Weights   04/22/21 0358 04/23/21 0547 04/24/21 0017  Weight: 103 kg 106 kg 106.2 kg   Examination:  General: On trach collar in no distress.  HEENT: Tracheostomy site CDI. Minimal secretions.  Chest: Clear bilateral breath sounds CV: RRR, no MRG Abdo: soft, non-distended Extremities: no edema.  Neuro: Eyes closed, no pain response.   Ancillary tests personally reviewed:     Assessment & Plan:  OOH Vfib arrest Post arrest anoxic brain injury, severe Post arrest myoclonus improved with klonopin, no AEDs recommended Post arrest respiratory failure- vent dependent due to mental status and secretions Post arrest cardiomyopathy Post arrest AKI, improved/stable HTN- stable on current regimen HCAP w/ MSSA- ABX > 2/21  Plan:  - Continue trach collar - Change trach to 6 cuffless today - Continue klonipin, if breakthrough myoclonus okay to go up on this - Chest vest - Appreciate SLP   Best Practice (right click and "Reselect all SmartList Selections" daily)  Per trh   3/21, AGACNP-BC [facility]  See Amion for personal pager PCCM on call pager [phone] until 7pm. Please call Elink 7p-7a. [phone]  04/24/2021 10:08 AM

## 2021-04-24 NOTE — Progress Notes (Signed)
° °  IR aware of this pt Will plan for G tube when appropriate  Tmax 100.5 yesterday Wbc elevated  Will plan when afeb 24 hrs or more  IR PA will place orders accordingly

## 2021-04-24 NOTE — Progress Notes (Signed)
Progress Note   Patient: Billy Cherry [id number] DOB: [birth year] DOA: 03/30/2021     25 DOS: the patient was seen and examined on 04/24/2021   Brief hospital course: 51 y/o male with HTN who was found to be unresponsive by his wife. It tooke 5-10 min for EMS to arrive. EMS found him to be in V fib with agonal respirations. Defibrillated once and then went into asystole. Given 2 round of Epinephrineand had ROSC.  Futher history later obtained from wife that the patient had  angioedema however there is no report of this per multiple physician's notes from admission.  EKG after ROSC showed anterolateral ST elevation and he was taken to the cath lab. Dr Josefa Half performed cath> insignificant CAD and EF 45-50%. Patient remained on ventilator after cath and was admitted to the ICU. Underwent TTM. Given Propofol.  1/19- neuro consulted- noted to have rhythmic twitching of his lips/tongue and was started on keppra   MRI brain> hypoxic ischemic injury involving bilateral ACA-MCA watershed cortex, bilat occipital cortex and BG ECHO showed EF of 20% with global hypokinesis. Treated for VAP.   1/25> showed episodes of eye opening and lip twitching, more prominent when stimulated consistent with myoclonic seizures.  Additionally EEG was suggestive of severe diffuse encephalopathy.  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  1/27- Neuro, Dr Hortense Ramal had Eddy conversation with wife who stated that she wanted to continue aggressive care  2/2 palliative care note> wife did not show up for meeting- wife was called  and noted that she "hopes to push out any decisions regarding her husband treatment  until his mother/[name] comes to [place].  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- placed on Vanc  for 7 day course  2/11 trach placed 2/16 recurrent fever 2/19 sputum culture grew pseudomonas see below for details - IR consulted for PEG tube after speaking with wife. 2/20 Change trach to 6 cuffless   Assessment and Plan: * Cardiac arrest [facility])- (present on admission) With resultant anoxic encephalopathy, respiratory failure, EF of < 20% -has tracheostomy and cor track   RLL pneumonia Sepsis - completed 7 day of Vanc on 2/15 for MSSA and [name] -2/16>  having fevers as high at 101- lactic acid normal, RR in 30-40s, tachycardic, large amounts of yellow and bloody sputum - WBC up to 16 from 14 - blood and sputum cultures ordered -2/16 CXR > LLL opacity improved- RLL opacity worse and now has RLL effusion - started Cefepime -- likely has aspirated - SARS COVID neg - sputum culture growing pseudomonas aeruginosa -sensitive to Ceftaz- recommend 7 days with last dose to be on 2/21 - temp of 100.5 on 2/19 but WBC continues to improve  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) - obtunded  Acute respiratory failure ([place])- (present on admission) - trach changed to size 6 cuffless today  HTN (hypertension) - severe uncontrolled HTN likely contributed to cardiac arrest - cont Coreg, Isordil, amlodipine, Cardura, Hydralazine and PRN Labetalol  Hypernatremia - resolved, follow intermittently - cont free water 400 cc Q 6 hrs for now  Myoclonus- (present on admission) - cont AEDs and benzodiazepines per Neuro- also per neuro notes, suppressing his myoclonus will not ultimately change the prognosis  Subjective: nonverbal  Physical Exam: Vitals:   04/24/21 0759 04/24/21 1000 04/24/21 1107 04/24/21 1158  BP:  126/74  (!) 142/91  Pulse:  82 88 88  Resp: 18 18 16    Temp:  97.7 F (36.5 C)    TempSrc:  Axillary    SpO2:  97%  99%  Weight:      Height:       General exam: Appears comfortable - remains obtunded Respiratory system: trach  present - on trach collar- lungs clear  Cardiovascular system: S1 & S2 heard, regular rate and rhythm Gastrointestinal system: Abdomen soft, non-tender, nondistended. Normal bowel sounds   Extremities: No cyanosis, clubbing or edema Skin: No rashes or ulcers     Data Reviewed:    Family Communication: wife  Disposition: Status is: Inpatient Remains inpatient appropriate because: IV Abx, needs PEG      Planned Discharge Destination:  TBD- wife declines to discuss it- states he is not going to a nursing home but does not want to talk about taking him home     Time spent: 35 minutes  Author: Debbe Odea, MD 04/24/2021 12:29 PM  For on call review [email or web address].

## 2021-04-24 NOTE — Progress Notes (Signed)
Patient nonverbal, has not been responding to painful stimuli. Spontaneous eye opening with repositioning. Cuff exchanged performed today. Repositioning q2h.

## 2021-04-24 NOTE — Procedures (Addendum)
Tracheostomy Exchange Procedure Note  Billy Cherry  [id number]  December 26, 1970  Date:04/24/21  Time:2:51 PM   Provider Performing:Nicolette Gieske W Mikey Bussing   Procedure: Tracheostomy Exchange Through Immature Stoma (77034)  Indication(s) Progressing. Has been off vent for one week. No need for vent. Ok to remove cuff.   Anesthesia None   Time Out Verified patient identification, verified procedure, site/side was marked, verified correct patient position, special equipment/implants available, medications/allergies/relevant history reviewed, required imaging and test results available.   Sterile Technique Hand hygiene, gloves   Procedure Description Size 6 cuffed existing Shiley removed and size 6 uncuffed Shiley placed through stoma.   Complications/Tolerance None; patient tolerated the procedure well..   EBL Minimal   Joneen Roach, AGACNP-BC [facility]  See Amion for personal pager PCCM on call pager [phone] until 7pm. Please call Elink 7p-7a. [phone]  04/24/2021 2:52 PM

## 2021-04-24 NOTE — TOC Progression Note (Addendum)
Transition of Care [facility]) - Progression Note    Patient Details  Name: Billy Cherry MRN: [id number] Date of Birth: 15-Jul-1970  Transition of Care [facility]) CM/SW Contact  Leone Haven, RN Phone Number: 04/24/2021, 12:52 PM  Clinical Narrative:    NCM spoke with [place] in financial counseling today and informed her that wife would like to get the Medicaid process started, [place] states she will reach out to the wife either today or tomorrow to get him screened for the Medicaid process. Wife is not interested in SNF at all.  Patient continues with anoxic brain injury, RLL PNA, trach, tube feeds at 60cc/hr, IV abx.   Expected Discharge Plan: Long Term Acute Care (LTAC) Barriers to Discharge: Continued Medical Work up  Expected Discharge Plan and Services Expected Discharge Plan: Long Term Acute Care (LTAC) In-house Referral: Hospice / Palliative Care Discharge Planning Services: CM Consult Post Acute Care Choice: Long Term Acute Care (LTAC) Living arrangements for the past 2 months: Single Family Home                 DME Arranged: N/A                     Social Determinants of Health (SDOH) Interventions    Readmission Risk Interventions Readmission Risk Prevention Plan 04/14/2021  Transportation Screening Complete  HRI or Home Care Consult Complete  Social Work Consult for Recovery Care Planning/Counseling Complete  Palliative Care Screening Complete  Medication Review Ocean[employer]) Complete  Some recent data might be hidden

## 2021-04-24 NOTE — Progress Notes (Signed)
°   04/24/21 0300  Assess: MEWS Score  BP 123/78  Pulse Rate 85  Resp (!) 26 (pt is having chest PT and has a trache)  SpO2 98 %  O2 Device Tracheostomy Collar  O2 Flow Rate (L/min) 5 L/min  FiO2 (%) 28 %  Assess: MEWS Score  MEWS Temp 0  MEWS Systolic 0  MEWS Pulse 0  MEWS RR 2  MEWS LOC 2  MEWS Score 4  MEWS Score Color Red  Assess: if the MEWS score is Yellow or Red  Were vital signs taken at a resting state? No  Focused Assessment No change from prior assessment  Early Detection of Sepsis Score *See Row Information* Low  MEWS guidelines implemented *See Row Information* No, other (Comment) (pt has a trache, pt gets suctioned q 1hr and gets chest PT q4 hrs)  Document  Patient Outcome Stabilized after interventions  Progress note created (see row info) Yes

## 2021-04-24 NOTE — Progress Notes (Signed)
°   04/24/21 0400  Assess: MEWS Score  Resp 20  Assess: MEWS Score  MEWS Temp 0  MEWS Systolic 0  MEWS Pulse 0  MEWS RR 0  MEWS LOC 2  MEWS Score 2  MEWS Score Color Yellow  Assess: if the MEWS score is Yellow or Red  Were vital signs taken at a resting state? No  Focused Assessment No change from prior assessment  Early Detection of Sepsis Score *See Row Information* Low  MEWS guidelines implemented *See Row Information* No, other (Comment) (pt has trache and suction q 1 hr)  Document  Patient Outcome Stabilized after interventions  Progress note created (see row info) Yes

## 2021-04-25 LAB — CBC
HCT: 35.3 % — ABNORMAL LOW (ref 39.0–52.0)
Hemoglobin: 11.1 g/dL — ABNORMAL LOW (ref 13.0–17.0)
MCH: 30.3 pg (ref 26.0–34.0)
MCHC: 31.4 g/dL (ref 30.0–36.0)
MCV: 96.4 fL (ref 80.0–100.0)
Platelets: 186 10*3/uL (ref 150–400)
RBC: 3.66 MIL/uL — ABNORMAL LOW (ref 4.22–5.81)
RDW: 13.6 % (ref 11.5–15.5)
WBC: 12.5 10*3/uL — ABNORMAL HIGH (ref 4.0–10.5)
nRBC: 0 % (ref 0.0–0.2)

## 2021-04-25 LAB — BASIC METABOLIC PANEL
Anion gap: 9 (ref 5–15)
BUN: 33 mg/dL — ABNORMAL HIGH (ref 6–20)
CO2: 27 mmol/L (ref 22–32)
Calcium: 7.9 mg/dL — ABNORMAL LOW (ref 8.9–10.3)
Chloride: 106 mmol/L (ref 98–111)
Creatinine, Ser: 0.78 mg/dL (ref 0.61–1.24)
GFR, Estimated: >60 mL/min (ref >60)
Glucose, Bld: 108 mg/dL — ABNORMAL HIGH (ref 70–99)
Potassium: 4.3 mmol/L (ref 3.5–5.1)
Sodium: 142 mmol/L (ref 135–145)

## 2021-04-25 LAB — CULTURE, BLOOD (ROUTINE X 2)
Culture: NO GROWTH
Culture: NO GROWTH
Special Requests: ADEQUATE
Special Requests: ADEQUATE

## 2021-04-25 MED ORDER — JEVITY 1.5 CAL/FIBER PO LIQD
1000.0000 mL | ORAL | Status: DC
  Administered 2021-04-25 – 2021-05-02 (×4): 1000 mL
  Filled 2021-04-25 (×12): qty 1000

## 2021-04-25 MED ORDER — CEFAZOLIN SODIUM-DEXTROSE 2-4 GM/100ML-% IV SOLN
2.0000 g | Freq: Three times a day (TID) | INTRAVENOUS | Status: AC
  Administered 2021-04-26: 2 g via INTRAVENOUS
  Filled 2021-04-25: qty 100

## 2021-04-25 MED ORDER — PROSOURCE TF PO LIQD
45.0000 mL | Freq: Two times a day (BID) | ORAL | Status: DC
  Administered 2021-04-25 – 2021-08-01 (×196): 45 mL
  Filled 2021-04-25 (×197): qty 45

## 2021-04-25 NOTE — Assessment & Plan Note (Addendum)
2D echocardiogram showed LVEF < 20%, global hypokinesis.  Started on GDMT.

## 2021-04-25 NOTE — Progress Notes (Signed)
°   04/24/21 2022  Assess: MEWS Score  Temp 99.6 F (37.6 C)  BP (!) 155/109  Pulse Rate 93  Resp (!) 27  Level of Consciousness Responds to Pain  O2 Device Tracheostomy Collar  Assess: MEWS Score  MEWS Temp 0  MEWS Systolic 0  MEWS Pulse 0  MEWS RR 2  MEWS LOC 2  MEWS Score 4  MEWS Score Color Red  Assess: if the MEWS score is Yellow or Red  Were vital signs taken at a resting state? Yes  Focused Assessment No change from prior assessment  Early Detection of Sepsis Score *See Row Information* Low  MEWS guidelines implemented *See Row Information* Yes  Treat  MEWS Interventions Administered scheduled meds/treatments  Pain Scale PAINAD  Breathing 0  Negative Vocalization 0  Facial Expression 0  Body Language 0  Consolability 0  PAINAD Score 0  Take Vital Signs  Increase Vital Sign Frequency  Yellow: Q 2hr X 2 then Q 4hr X 2, if remains yellow, continue Q 4hrs;Red: Q 1hr X 4 then Q 4hr X 4, if remains red, continue Q 4hrs  Escalate  MEWS: Escalate Red: discuss with charge nurse/RN and provider, consider discussing with RRT  Notify: Charge Nurse/RN  Name of Charge Nurse/RN Notified Jequetta  Date Charge Nurse/RN Notified 04/24/21  Time Charge Nurse/RN Notified 2030  Document  Patient Outcome Stabilized after interventions  Progress note created (see row info) Yes

## 2021-04-25 NOTE — Progress Notes (Signed)
Received called from IR. Was Barium will need to be given via NG tube at 8pm in preparation for gastrostomy tube placement tomorrow.

## 2021-04-25 NOTE — Progress Notes (Signed)
Nutrition Follow-up  DOCUMENTATION CODES:   Not applicable  INTERVENTION:   Tube Feeding via Cortrak: Transition to Jevity 1.5 @ 60 mL/hr (1440 mL/day) Decrease 45 mL ProSource TF to BID Continue 400 mL free water q6h  Provides 2240 kcal, 114 gm PRO, and 1094 mL free water (2694 mL total free water) daily.   NUTRITION DIAGNOSIS:   Inadequate oral intake related to inability to eat as evidenced by NPO status. - Ongoing, being addressed via TF  GOAL:   Patient will meet greater than or equal to 90% of their needs - Being addressed via TF  MONITOR:   TF tolerance, Weight trends, Labs, Skin  REASON FOR ASSESSMENT:   Ventilator, Consult Enteral/tube feeding initiation and management  ASSESSMENT:   51 year old male who originally [facility] on [facility]19 after out-of-hospital cardiac arrest due to suspected angioedema from ACE inhibitor leading to asphyxiation. PMH of HTN, HLD.  2/11: tracheostomy placed  Per MD, plan for PEG in 1-2 days by IR. Likely discharge after.   No family at bedside. Pt nonverbal.   Tube feed on hold. Reached out to RN, TF will be resumed after chest physiotherapy vest.  Will transition pt to standard formula to prepare to discharge.   Medications reviewed and include: Fiber, Protonix, IV antibiotics Labs reviewed: BUN 33  Diet Order:   Diet Order             Diet NPO time specified Except for: Sips with Meds  Diet effective midnight                   EDUCATION NEEDS:   Not appropriate for education at this time  Skin:  Skin Assessment: Skin Integrity Issues: Skin Integrity Issues:: Other (Comment) Other: MASD buttocks  Last BM:  2/20  Height:   Ht Readings from Last 1 Encounters:  04/18/21 5\' 10"  (1.778 m)    Weight:   Wt Readings from Last 1 Encounters:  04/24/21 106.2 kg    Ideal Body Weight:  75.5 kg  BMI:  Body mass index is 33.59 kg/m.  Estimated Nutritional Needs:   Kcal:  2200-2400  Protein:   110-125 grams  Fluid:  >/= 2.2 L   04/26/21 RD, LDN Clinical Dietitian See [facility] for contact information.

## 2021-04-25 NOTE — Progress Notes (Signed)
PT Cancellation Note  Patient Details Name: Billy Cherry MRN: [id number] DOB: [birth year]   Cancelled Treatment:    Reason Eval/Treat Not Completed: Other (comment). All that remains for PT to complete is showing the wife PROM. Wife not currently here. Will continue to monitor for wife being present to instruct in PROM. After that PT will sign off.    Angelina Ok [facility] 04/25/2021, 12:26 PM [name] PT Acute Rehabilitation Services Pager [phone] Office [phone]

## 2021-04-25 NOTE — Progress Notes (Signed)
Speech Language Pathology Treatment: Billy Cherry Speaking valve  Patient Details Name: Billy Cherry MRN: [id number] DOB: 20-Jun-1970 Today's Date: 04/25/2021 Time: 2263-3354 SLP Time Calculation (min) (ACUTE ONLY): 11 min  Assessment / Plan / Recommendation Clinical Impression  Mr Billy Cherry appeared comfortable upon arrival. His trach has been changed to a #6 cuffless; VS noted and PMV placed to determine if any spontaneous voicing could be elicited. He sighed/groaned audibly on several occasions, not purposefully.  VS remained stable with PMV in place and occasional removal revealed no evidence of back pressure. Pt showed generalized response to auditory stimuli and pressure to nail beds/sternal rub.  He did not blink to threat. He did not demonstrate spontaneous swallow or signs of readiness for a swallow assessment.    PMV was removed and HOB reclined to prior position at 45 degrees.  Will follow for education with family.      HPI HPI: Pt is a 51 y.o. male who presented 03/23/21 as a code STEMI after being found unresponsive. V. fib cardiac arrest in setting of respiratory arrest due to suspected angioedema from ACE inhibitor leading to asphyxiation.  Concern on his 12 lead EKG for anterolateral ST elevation so he was taken to the cath lab where his study showed no significant coronary artery disease and an LVEF < 20%. 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 S/p cortak placement 1/27, trach placement 2/11; downsized to 6 cuffless.  ETT 1/19 - 2/11. PMH: HTN      SLP Plan  Continue with current plan of care      Recommendations for follow up therapy are one component of a multi-disciplinary discharge planning process, led by the attending physician.  Recommendations may be updated based on patient status, additional functional criteria and insurance authorization.     Recommendations         Patient may use Passy-Muir Speech Valve: with SLP only         Oral Care Recommendations: Oral care QID Follow Up Recommendations: SLP at Long-term acute care hospital Assistance recommended at discharge: Frequent or constant Supervision/Assistance SLP Visit Diagnosis: Aphonia (R49.1) Plan: Continue with current plan of care        Billy Cherry L. Billy Frederic, MA CCC/SLP Acute Rehabilitation Services Office number [phone] Pager [phone]    Billy Cherry Billy Cherry  04/25/2021, 10:09 AM

## 2021-04-25 NOTE — Progress Notes (Signed)
°   04/24/21 2000  Assess: MEWS Score  Level of Consciousness Responds to Pain  Assess: MEWS Score  MEWS Temp 0  MEWS Systolic 0  MEWS Pulse 0  MEWS RR 1  MEWS LOC 2  MEWS Score 3  MEWS Score Color Yellow   Patient has been yellow but upon assessment his LOC is responsive to pain. Patient has been like this prior to shift change. Will continue to monitor and transfer him over to air mattress bed

## 2021-04-25 NOTE — Progress Notes (Signed)
Progress Note   Patient: Billy Cherry SWH:[id number] DOB: 1971/01/14 DOA: 03/30/2021     26 DOS: the patient was seen and examined on 04/25/2021   Brief hospital course: 51 y/o male with HTN who was found to be unresponsive by his wife. It tooke 5-10 min for EMS to arrive. EMS found him to be in V fib with agonal respirations. Defibrillated once and then went into asystole. Given 2 round of Epinephrineand had ROSC.  Futher history later obtained from wife that the patient had  angioedema however there is no report of this per multiple physician's notes from admission.  EKG after ROSC showed anterolateral ST elevation and he was taken to the cath lab. Dr Cassie Freer performed cath> insignificant CAD and EF 45-50%. Patient remained on ventilator after cath and was admitted to the ICU. Underwent TTM. Given Propofol.  1/19- neuro consulted- noted to have rhythmic twitching of his lips/tongue and was started on keppra   MRI brain> hypoxic ischemic injury involving bilateral ACA-MCA watershed cortex, bilat occipital cortex and BG ECHO showed EF of 20% with global hypokinesis. Treated for VAP.   1/25> showed episodes of eye opening and lip twitching, more prominent when stimulated consistent with myoclonic seizures.  Additionally EEG was suggestive of severe diffuse encephalopathy.  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  1/27- Neuro, Dr Melynda Ripple had GOC conversation with wife who stated that she wanted to continue aggressive care  2/2 palliative care note> wife did not show up for meeting- wife was called  and noted that she "hopes to push out any decisions regarding her husband treatment  until his mother/[name] comes to [place].  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- placed on Vanc  for 7 day course  2/11 trach placed 2/16 recurrent fever 2/19 sputum culture grew pseudomonas see below for details - IR consulted for PEG tube after speaking with wife. 2/20 Change trach to 6 cuffless   Assessment and Plan: * Cardiac arrest ([name])- (present on admission) With resultant anoxic encephalopathy, respiratory failure, EF of < 20% -has tracheostomy and cor trak - appreciate IR eval- they plan for PEG in the next 1-2 days   RLL pneumonia Sepsis- pseudomonas pneumonia - completed 7 day of Vanc on 2/15 for MSSA and [name] -2/16>  having fevers as high at 101- lactic acid normal, RR in 30-40s, tachycardic, large amounts of yellow and bloody sputum - WBC up to 16 from 14 - blood and sputum cultures ordered -2/16 CXR > LLL opacity improved- RLL opacity worse and now has RLL effusion - started Cefepime -- likely has aspirated - SARS COVID neg - sputum culture growing pseudomonas aeruginosa -sensitive to Ceftaz- recommend 7 days with last dose to be on 2/21 - temps occasionally ~ 100.5 - suspect this remaining fever is neurogenic as he has improved clinically- cont to follow plz  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) - obtunded  Acute respiratory failure ([name])- (present on admission) - trach changed to size 6 cuffless on 2/20  HTN (hypertension) - severe uncontrolled HTN likely contributed to cardiac arrest - cont Coreg, Isordil, amlodipine, Cardura, Hydralazine and PRN Labetalol  HFrEF (heart failure with reduced ejection fraction) ([name]) - cont coreg, imdur, hydralazine - fluid management with tube feeds  Hypernatremia -  resolved, follow intermittently - cont free water 400 cc Q 6 hrs for now  Myoclonus- (present on admission) - cont AEDs and benzodiazepines per Neuro- also per neuro notes, suppressing his myoclonus will not ultimately change the prognosis        Subjective: nonverbal  Physical Exam: Vitals:   04/25/21  0000 04/25/21 0400 04/25/21 0439 04/25/21 0828  BP:    (!) 138/98  Pulse:   89 90  Resp:   (!) 30 (!) 26  Temp: 100.1 F (37.8 C) (!) 100.4 F (38 C)  99.8 F (37.7 C)  TempSrc:  Oral  Axillary  SpO2:   98% 99%  Weight:      Height:       General exam: Appears comfortable  HEENT:  no sclera icterus or thrush- trach with trach collar present Respiratory system: Clear to auscultation. Respiratory effort normal. Cardiovascular system: S1 & S2 heard, regular rate and rhythm Gastrointestinal system: Abdomen soft, non-tender, nondistended. Normal bowel sounds   Central nervous system:  - remains obtunded and unresponsive to pain Extremities: No cyanosis, clubbing or edema Skin: No rashes or ulcers    Data Reviewed:    Family Communication: wife  Disposition: Status is: Inpatient Remains inpatient appropriate because: His wife wants aggressive care-  technically, is stable to dc after PEG placed, TOC working on [employer] but wife also told TOC that she will not let him go to a SNF and was going to take him home. She told me she did not want to talk about his going home.          Planned Discharge Destination: Barriers to discharge: PEG and decision from wife     Time spent: 35 minutes  Author: Calvert Cantor, MD 04/25/2021 10:12 AM  For on call review [email or web address].

## 2021-04-26 ENCOUNTER — Inpatient Hospital Stay (HOSPITAL_COMMUNITY): Payer: BC Managed Care – PPO

## 2021-04-26 HISTORY — PX: IR GASTROSTOMY TUBE MOD SED: IMG625

## 2021-04-26 LAB — GLUCOSE, CAPILLARY
Glucose-Capillary: 122 mg/dL — ABNORMAL HIGH (ref 70–99)
Glucose-Capillary: 79 mg/dL (ref 70–99)
Glucose-Capillary: 86 mg/dL (ref 70–99)
Glucose-Capillary: 98 mg/dL (ref 70–99)

## 2021-04-26 MED ORDER — GLUCAGON HCL RDNA (DIAGNOSTIC) 1 MG IJ SOLR
INTRAMUSCULAR | Status: AC | PRN
  Administered 2021-04-26: 1 mg via INTRAVENOUS

## 2021-04-26 MED ORDER — CEFAZOLIN SODIUM-DEXTROSE 2-4 GM/100ML-% IV SOLN
INTRAVENOUS | Status: AC
  Filled 2021-04-26: qty 100

## 2021-04-26 MED ORDER — DEXTROSE 50 % IV SOLN
25.0000 mL | Freq: Once | INTRAVENOUS | Status: AC
  Administered 2021-04-26: 25 mL via INTRAVENOUS
  Filled 2021-04-26: qty 50

## 2021-04-26 MED ORDER — LIDOCAINE HCL 1 % IJ SOLN
INTRAMUSCULAR | Status: AC
  Filled 2021-04-26: qty 20

## 2021-04-26 MED ORDER — LABETALOL HCL 5 MG/ML IV SOLN
INTRAVENOUS | Status: AC
  Filled 2021-04-26: qty 4

## 2021-04-26 MED ORDER — IOHEXOL 300 MG/ML  SOLN
100.0000 mL | Freq: Once | INTRAMUSCULAR | Status: AC | PRN
  Administered 2021-04-26: 15 mL via INTRATHECAL

## 2021-04-26 MED ORDER — FENTANYL CITRATE (PF) 100 MCG/2ML IJ SOLN
INTRAMUSCULAR | Status: AC
  Filled 2021-04-26: qty 2

## 2021-04-26 MED ORDER — LIDOCAINE HCL 1 % IJ SOLN
INTRAMUSCULAR | Status: AC | PRN
  Administered 2021-04-26: 20 mL

## 2021-04-26 MED ORDER — MIDAZOLAM HCL 2 MG/2ML IJ SOLN
INTRAMUSCULAR | Status: AC
  Filled 2021-04-26: qty 2

## 2021-04-26 MED ORDER — SODIUM CHLORIDE 0.9 % IV SOLN
INTRAVENOUS | Status: AC | PRN
Start: 2021-04-26 — End: 2021-04-26
  Administered 2021-04-26: 10 mL/h via INTRAVENOUS

## 2021-04-26 MED ORDER — GLUCAGON HCL RDNA (DIAGNOSTIC) 1 MG IJ SOLR
INTRAMUSCULAR | Status: AC
  Filled 2021-04-26: qty 1

## 2021-04-26 NOTE — Sedation Documentation (Addendum)
No sedation was given as per Dr Dwaine Gale. Patient was suctioned several times per trach, moderate amounts of  cloudy white mucous.

## 2021-04-26 NOTE — Progress Notes (Signed)
SLP Cancellation Note  Patient Details Name: Billy Cherry MRN: [id number] DOB: 17-May-1970   Cancelled treatment:        Attempted to see pt for PMV therapy and family education.  Pt off floor at time of attempt.  Scheduled for PEG placement today.  Will reattempt as SLP schedule permits when family is present.   Kerrie Pleasure, MA, CCC-SLP Acute Rehabilitation Services Office: [phone]  04/26/2021, 9:41 AM

## 2021-04-26 NOTE — Progress Notes (Signed)
PROGRESS NOTE  Kingdom Vanzanten OPF:[id number] DOB: Oct 09, 1970 DOA: 03/30/2021 PCP: Patient, No Pcp Per (Inactive)  Brief History   51 y/o male with HTN who was found to be unresponsive by his wife. It tooke 5-10 min for EMS to arrive. EMS found him to be in V fib with agonal respirations. Defibrillated once and then went into asystole. Given 2 round of Epinephrineand had ROSC.  Futher history later obtained from wife that the patient had  angioedema however there is no report of this per multiple physician's notes from admission.   EKG after ROSC showed anterolateral ST elevation and he was taken to the cath lab. Dr Josefa Half performed cath> insignificant CAD and EF 45-50%. Patient remained on ventilator after cath and was admitted to the ICU. Underwent TTM. Given Propofol.  1/19- neuro consulted- noted to have rhythmic twitching of his lips/tongue and was started on keppra   MRI brain> hypoxic ischemic injury involving bilateral ACA-MCA watershed cortex, bilat occipital cortex and BG ECHO showed EF of 20% with global hypokinesis. Treated for VAP.    1/25> showed episodes of eye opening and lip twitching, more prominent when stimulated consistent with myoclonic seizures.  Additionally EEG was suggestive of severe diffuse encephalopathy.  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   1/27- Neuro, Dr Hortense Ramal had Elizabeth conversation with wife who stated that she wanted to continue aggressive care   2/2 palliative care note> wife did not show up for meeting- wife was called  and noted that she "hopes to push out any decisions regarding her husband treatment  until his mother/[name] comes to [place].  Unfortunately she will not be here until February 17th"   2/6 new fever   2/10> PCCM and wife did consent for trach and decided to pursue PEG as well - resp culture revealed MSSA and Flavobacterium Odoratum- placed on Vanc for 7 day course   2/11 trach  placed 2/16 recurrent fever 2/19 sputum culture grew pseudomonas see below for details - IR consulted for PEG tube after speaking with wife. 2/20 Change trach to 6 cuffless  2/22 This author completed the paperwork left by the patient's wife as requested. Patient went for PEG placement as consented by the patient's wife earlier this week. Tube feeds held during this process and restarted afterwards.   Consultants  Interventional radiology Pulmonary critical care Palliative care Cardiology ENT  Procedures  Mechanical ventilation Tracheostomy PEG tube placement  Antibiotics   Anti-infectives (From admission, onward)    Start     Dose/Rate Route Frequency Ordered Stop   04/26/21 0756  ceFAZolin (ANCEF) 2-4 GM/100ML-% IVPB  Status:  Discontinued       Note to Pharmacy: [name] L: cabinet override      04/26/21 0756 04/26/21 0855   04/26/21 0600  ceFAZolin (ANCEF) IVPB 2g/100 mL premix        2 g 200 mL/hr over 30 Minutes Intravenous Every 8 hours 04/25/21 1131 04/26/21 0915   04/23/21 1400  cefTAZidime (FORTAZ) 1 g in sodium chloride 0.9 % 100 mL IVPB        1 g 200 mL/hr over 30 Minutes Intravenous Every 8 hours 04/23/21 1256 04/25/21 2331   04/20/21 1300  ceFEPIme (MAXIPIME) 2 g in sodium chloride 0.9 % 100 mL IVPB  Status:  Discontinued        2 g 200 mL/hr over 30 Minutes Intravenous Every 8 hours 04/20/21 1205 04/23/21 1241   04/14/21  0930  vancomycin (VANCOREADY) IVPB 1250 mg/250 mL        1,250 mg 166.7 mL/hr over 90 Minutes Intravenous Every 24 hours 04/13/21 0902 04/19/21 1247   04/13/21 0930  vancomycin (VANCOREADY) IVPB 2000 mg/400 mL        2,000 mg 200 mL/hr over 120 Minutes Intravenous  Once 04/13/21 0831 04/13/21 1158   04/12/21 0815  ceFEPIme (MAXIPIME) 2 g in sodium chloride 0.9 % 100 mL IVPB  Status:  Discontinued        2 g 200 mL/hr over 30 Minutes Intravenous Every 8 hours 04/12/21 0724 04/13/21 0829   04/06/21 1800  ceFAZolin (ANCEF) IVPB 2g/100 mL  premix        2 g 200 mL/hr over 30 Minutes Intravenous Every 8 hours 04/06/21 1403 04/10/21 1913   04/04/21 1000  ceFEPIme (MAXIPIME) 2 g in sodium chloride 0.9 % 100 mL IVPB  Status:  Discontinued        2 g 200 mL/hr over 30 Minutes Intravenous Every 8 hours 04/04/21 0919 04/06/21 1403        Subjective  The patient is only minimally responsive with phonation only to stimulation.  Objective   Vitals:  Vitals:   04/26/21 1600 04/26/21 1620  BP: (!) 159/114   Pulse: 89   Resp: (!) 24 (!) 23  Temp: 99.8 F (37.7 C)   SpO2: 94% 97%    Exam:  Constitutional:  The patient is not verbally responsive. No acute Respiratory:  No increased work of breathing. No wheezes, rales, or rhonchi No tactile fremitus Cardiovascular:  Regular rate and rhythm No murmurs, ectopy, or gallups. No lateral PMI. No thrills. Abdomen:  Abdomen is soft, non-tender, non-distended No hernias, masses, or organomegaly Normoactive bowel sounds.  Musculoskeletal:  No cyanosis, clubbing, or edema Skin:  No rashes, lesions, ulcers palpation of skin: no induration or nodules Neurologic:  CN 2-12 intact Sensation all 4 extremities intact Psychiatric:  Mental status Mood, affect appropriate Orientation to person, place, time  judgment and insight appear intact  I have personally reviewed the following:   Today's Data  Vitals  Lab Data    Micro Data  Respiratory culture has grown pseudomonas on 04/20/2021  Imaging  CXR CT head CT abdomen with contrast  Cardiology Data  EKG Echocardiogram LHC  Other Data    Scheduled Meds:  amLODipine  10 mg Per Tube Daily   carvedilol  25 mg Per Tube BID   chlorhexidine gluconate (MEDLINE KIT)  15 mL Mouth Rinse BID   clonazepam  0.5 mg Per Tube TID   doxazosin  2 mg Per Tube Daily   feeding supplement (PROSource TF)  45 mL Per Tube BID   fentaNYL       fiber  1 packet Per Tube BID   free water  400 mL Per Tube Q6H   Gerhardt's butt  cream   Topical Daily   glucagon (human recombinant)       heparin  5,000 Units Subcutaneous Q8H   hydrALAZINE  50 mg Per Tube Q8H   isosorbide dinitrate  20 mg Per Tube TID   labetalol       levETIRAcetam  750 mg Per Tube BID   lidocaine       mouth rinse  15 mL Mouth Rinse 10 times per day   midazolam       pantoprazole sodium  40 mg Per Tube QHS   valproic acid  750 mg Per Tube TID  Continuous Infusions:  feeding supplement (JEVITY 1.5 CAL/FIBER) Stopped (04/26/21 0005)    Principal Problem:   Cardiac arrest ([place]) Active Problems:   Acute respiratory failure ([name])   Myoclonus   Anoxic encephalopathy ([name])   Hypernatremia   HFrEF (heart failure with reduced ejection fraction) ([name])   RLL pneumonia   Sepsis ([place])   HTN (hypertension)   LOS: 27 days   A & P  Assessment and Plan: * Cardiac arrest ([place])- (present on admission) With resultant anoxic encephalopathy, respiratory failure, EF of < 20% -has tracheostomy and cor trak - PEG placed today. Will begin to use tomorrow as per IR instructions.   RLL pneumonia Sepsis- pseudomonas pneumonia - completed 7 day of Vanc on 2/15 for MSSA and [name] -2/16>  having fevers as high at 101- lactic acid normal, RR in 30-40s, tachycardic, large amounts of yellow and bloody sputum - WBC up to 16 from 14 - blood and sputum cultures ordered -2/16 CXR > LLL opacity improved- RLL opacity worse and now has RLL effusion - started Cefepime -- likely has aspirated - SARS COVID neg - sputum culture growing pseudomonas aeruginosa -sensitive to Ceftaz- recommend 7 days with last dose to be on 2/21 - temps occasionally ~ 100.5 - suspect this remaining fever is neurogenic as he has improved clinically- cont to follow plz -No fevers in the past 24 hours.   Anoxic encephalopathy ([place])- (present on admission) - obtunded   Acute respiratory failure ([place])- (present on admission) - trach changed to size 6 cuffless on  2/20   HTN (hypertension) - severe uncontrolled HTN likely contributed to cardiac arrest - cont Coreg, Isordil, amlodipine, Cardura, Hydralazine and PRN Labetalol   HFrEF (heart failure with reduced ejection fraction) ([name]) - cont coreg, imdur, hydralazine - fluid management with tube feeds   Hypernatremia - resolved, follow intermittently - cont free water 400 cc Q 6 hrs for now   Myoclonus- (present on admission) - cont AEDs and benzodiazepines per Neuro- also per neuro notes, suppressing his myoclonus will not ultimately change the prognosis   I have seen and examined this patient myself. I have spent 168 minutes in his evaluation, care, and completion of requested paperwork today  DVT prophylaxis: heparin Code Status: Full Code Family Communication: I called the patient's wife as requested this afternoon. She was irate that she was not informed that her husband's PEG would be placed today. She was also irate that the paperwork that she left to be completed on Monday had not been completed until today. She seemed to be irate in general and proceeded to berate and yell at this author on the phone for 15-20 minutes. I repeated asked her how could I help her and what could I do for her. She just continued to accuse me of not caring for her spouse, and not recognizing her rights. I attempted to explain the situation to no avail. At the end of the conversation she informed me that she had recorded the conversation without my consent. Both sides of this conversation were witnessed by my colleagues working next to me. Her voice was so loud that this could not be avoided. Disposition Plan: TBD    Arrietty Dercole, DO [facility] Direct contact: see [email or web address]  7PM-7AM contact night coverage as above 04/26/2021, 7:56 PM  LOS: 27 days

## 2021-04-26 NOTE — Progress Notes (Signed)
Order place for tube feeds to resume through NGT.

## 2021-04-26 NOTE — Progress Notes (Signed)
Pt planned for PEG tube placement tomorrow. Noted order in chart for "please give 75 ml of omni 300 barium via the NG for g tube placement scheduled for 2.22.23", with order start time of 2000. However, barium at bedside is "Varibar thin barium".  Contacted radiology tech who contacted on call radiologist. This RN spoke with Dr. Ruel Favors on telephone who confirmed to give 56ml of the Varibar thin barium, not the omni 300 barium.   RN gave 48ml of Varibar thin barium via cortrak at 2055.

## 2021-04-26 NOTE — Progress Notes (Signed)
OT Cancellation Note  Patient Details Name: Attila Mccarthy MRN: [id number] DOB: 30-Jan-1971   Cancelled Treatment:    Reason Eval/Treat Not Completed: Other (comment) (peg this AM- no family present to educate.)  Mateo Flow 04/26/2021, 1:42 PM

## 2021-04-26 NOTE — Progress Notes (Signed)
PT Cancellation Note  Patient Details Name: Billy Cherry MRN: [id number] DOB: 1970/04/09   Cancelled Treatment:    Reason Eval/Treat Not Completed: Patient at procedure or test/unavailable (Pt down for PEG placement.  Will return at later date.)   Bevelyn Buckles 04/26/2021, 9:57 AM [facility] M,PT Acute Rehab Services [phone] (pager)

## 2021-04-26 NOTE — TOC Progression Note (Addendum)
Transition of Care [facility]) - Progression Note    Patient Details  Name: Oneal Schoenberger MRN: [id number] Date of Birth: 1970/06/27  Transition of [facility]) CM/SW Contact  Zenon Mayo, RN Phone Number: 04/26/2021, 8:39 AM  Clinical Narrative:    Patent is for peg tube today, he also has a trach, NCM spoke with wife.  To see if she has a preference for [facility] services for patient. She stats no just give them what they need, NCM informed her that will check with [facility] because they have an Adult Service for trach care and they can take commercial and Medicaid insurance.  She is ok with this.  She also asked is she will be taught the trach care.  NCM informed her yes.  NCM contacted [name] Reather Laurence with [facility] and left vm for return call.  Wife states she is off on Thursday's, she works at [employer] in [place], she would like to get trach teaching on Thursday at 10am , also she is here from 6 pm to 8 pm ,will ask the Nursing Staff to do trach teaching with her also.  NCM also contacted Zach with [employer] to request trach kit supplies and tube feeds.  Dr. Benny Lennert filled out paper work that was on the chart, this NCM faxed it to the number on front of paper work, informed wife of this information. Wife states patient's PCP is Meryl Crutch,  with [facility], [place], phone [phone], fax [phone].   Per [name] with [facility] ,[employer] will usually take 5  to 7 business days to approve [facility] for trach care , he has sent in for authorization, he will aske for 12hrs per day for 7 days a week, but they may give less hours. Financial counseling is also working on [employer] for patient also.    Expected Discharge Plan: Long Term Acute Care (LTAC) Barriers to Discharge: Continued Medical Work up  Expected Discharge Plan and Services Expected Discharge Plan: Long Term Acute Care (LTAC) In-house Referral: Hospice / Palliative Care Discharge Planning Services: CM Consult Post Acute Care  Choice: Long Term Acute Care (LTAC) Living arrangements for the past 2 months: Single Family Home                 DME Arranged: N/A                     Social Determinants of Health (SDOH) Interventions    Readmission Risk Interventions Readmission Risk Prevention Plan 04/14/2021  Transportation Screening Complete  HRI or Home Care Consult Complete  Social Work Consult for [place] Planning/Counseling Complete  Palliative Care Screening Complete  Medication Review [employer]) Complete  Some recent data might be hidden

## 2021-04-26 NOTE — Progress Notes (Signed)
Spoke with patient's wife this morning. States she was not aware that patient was going for PEG tube placement. States she has not talked with a provider. Informed wife, night shift nurse was informed IR had a signed consent this morning, even though nurses did not see one in his chart. Wife frustrated. Asked if provider has faxed paper work left in chart on Monday. Requesting a call from provider as soon as possible. MD informed.

## 2021-04-26 NOTE — Procedures (Signed)
Interventional Radiology Procedure Note  Procedure: Placement of percutaneous 97F pull-through gastrostomy tube.  Indication: Hypoxic brain injury  Complications: None  Findings:  Please see dictation in Imaging section for full description.  Recommendations: - Maintain G-tube on LWS until tomorrow morning. - May advance diet as tolerated and begin using tube 24 hours after placement.  Mauri Reading Cherysh Epperly, MD [phone]

## 2021-04-27 LAB — COMPREHENSIVE METABOLIC PANEL
ALT: 56 U/L — ABNORMAL HIGH (ref 0–44)
AST: 76 U/L — ABNORMAL HIGH (ref 15–41)
Albumin: 1.8 g/dL — ABNORMAL LOW (ref 3.5–5.0)
Alkaline Phosphatase: 45 U/L (ref 38–126)
Anion gap: 7 (ref 5–15)
BUN: 29 mg/dL — ABNORMAL HIGH (ref 6–20)
CO2: 31 mmol/L (ref 22–32)
Calcium: 8.2 mg/dL — ABNORMAL LOW (ref 8.9–10.3)
Chloride: 101 mmol/L (ref 98–111)
Creatinine, Ser: 0.91 mg/dL (ref 0.61–1.24)
GFR, Estimated: >60 mL/min (ref >60)
Glucose, Bld: 98 mg/dL (ref 70–99)
Potassium: 4.3 mmol/L (ref 3.5–5.1)
Sodium: 139 mmol/L (ref 135–145)
Total Bilirubin: 0.4 mg/dL (ref 0.3–1.2)
Total Protein: 6.3 g/dL — ABNORMAL LOW (ref 6.5–8.1)

## 2021-04-27 LAB — CBC WITH DIFFERENTIAL/PLATELET
Abs Immature Granulocytes: 0.2 10*3/uL — ABNORMAL HIGH (ref 0.00–0.07)
Basophils Absolute: 0 10*3/uL (ref 0.0–0.1)
Basophils Relative: 0 %
Eosinophils Absolute: 0.2 10*3/uL (ref 0.0–0.5)
Eosinophils Relative: 1 %
HCT: 39.4 % (ref 39.0–52.0)
Hemoglobin: 12.6 g/dL — ABNORMAL LOW (ref 13.0–17.0)
Immature Granulocytes: 1 %
Lymphocytes Relative: 18 %
Lymphs Abs: 2.5 10*3/uL (ref 0.7–4.0)
MCH: 30.1 pg (ref 26.0–34.0)
MCHC: 32 g/dL (ref 30.0–36.0)
MCV: 94 fL (ref 80.0–100.0)
Monocytes Absolute: 1.5 10*3/uL — ABNORMAL HIGH (ref 0.1–1.0)
Monocytes Relative: 11 %
Neutro Abs: 9.7 10*3/uL — ABNORMAL HIGH (ref 1.7–7.7)
Neutrophils Relative %: 69 %
Platelets: 165 10*3/uL (ref 150–400)
RBC: 4.19 MIL/uL — ABNORMAL LOW (ref 4.22–5.81)
RDW: 14 % (ref 11.5–15.5)
WBC: 14.1 10*3/uL — ABNORMAL HIGH (ref 4.0–10.5)
nRBC: 0 % (ref 0.0–0.2)

## 2021-04-27 MED ORDER — ATROPINE SULFATE 1 % OP SOLN
2.0000 [drp] | Freq: Four times a day (QID) | OPHTHALMIC | Status: DC
  Administered 2021-04-27 – 2021-10-04 (×624): 2 [drp] via SUBLINGUAL
  Filled 2021-04-27 (×29): qty 2

## 2021-04-27 MED ORDER — SCOPOLAMINE 1 MG/3DAYS TD PT72
1.0000 | MEDICATED_PATCH | TRANSDERMAL | Status: DC
  Administered 2021-04-27 – 2021-10-03 (×53): 1.5 mg via TRANSDERMAL
  Filled 2021-04-27 (×56): qty 1

## 2021-04-27 MED ORDER — CHLORHEXIDINE GLUCONATE 0.12 % MT SOLN
OROMUCOSAL | Status: AC
  Filled 2021-04-27: qty 15

## 2021-04-27 NOTE — Plan of Care (Signed)
°  Problem: Education: Goal: Knowledge of General Education information will improve Description: Including pain rating scale, medication(s)/side effects and non-pharmacologic comfort measures Outcome: Not Progressing  Family not present to educate on home health needs. Problem: Health Behavior/Discharge Planning: Goal: Ability to manage health-related needs will improve Outcome: Not Progressing   Problem: Activity: Goal: Risk for activity intolerance will decrease Outcome: Progressing   Problem: Nutrition: Goal: Adequate nutrition will be maintained Outcome: Progressing   Problem: Elimination: Goal: Will not experience complications related to bowel motility Outcome: Progressing   Problem: Pain Managment: Goal: General experience of comfort will improve Outcome: Progressing

## 2021-04-27 NOTE — Progress Notes (Signed)
Cortack removed per order tube feeding tolerated via G-Tube

## 2021-04-27 NOTE — Progress Notes (Signed)
Physical Therapy Treatment Patient Details Name: Billy Cherry MRN: [id number] DOB: 1971/02/28 Today's Date: 04/27/2021   History of Present Illness 51 y.o. male who presented 03/23/21 as a code STEMI after being found unresponsive. V. fib cardiac arrest in setting of respiratory arrest due to suspected angioedema from ACE inhibitor leading to asphyxiation.  Concern on his 12 lead EKG for anterolateral ST elevation so he was taken to the cath lab where his study showed no significant coronary artery disease and an LVEF < 20%. 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 S/p cortak placement 1/27, trach placement 2/11. ETT 1/19 - 2/11. PMH: HTN    PT Comments    Planned for family training during session today, but upon our arrival no family present and when called, they stated they could not be available until next Thursday for training. The pt was soiled of tube feed and therefore session was focused on rolling in bed in addition to ROM exercises for UE, hips, and knee. The pt continues to not show any response other than intermittent eye opening to verbal, tactile, or noxious stimuli. He demos no visual tracking or verbal response at this time. Continue to recommend long term care after d/c, plan to complete family training prior to signing off.     Recommendations for follow up therapy are one component of a multi-disciplinary discharge planning process, led by the attending physician.  Recommendations may be updated based on patient status, additional functional criteria and insurance authorization.  Follow Up Recommendations  PT at Long-term acute care hospital     Assistance Recommended at Discharge Frequent or constant Supervision/Assistance  Patient can return home with the following Two people to help with walking and/or transfers;Two people to help with  bathing/dressing/bathroom;Assistance with cooking/housework;Assistance with feeding;Direct supervision/assist for medications management;Direct supervision/assist for financial management;Assist for transportation;Help with stairs or ramp for entrance   Equipment Recommendations  Wheelchair (measurements PT);Wheelchair cushion (measurements PT);Hospital bed;Other (comment) (hoyer lift)    Recommendations for Other Services       Precautions / Restrictions Precautions Precautions: Fall;Other (comment) Precaution Comments: trach; cortrak Restrictions Weight Bearing Restrictions: No     Mobility  Bed Mobility Overal bed mobility: Needs Assistance Bed Mobility: Rolling Rolling: Total assist         General bed mobility comments: Total A for rolling    Transfers                           Balance Overall balance assessment: Needs assistance Sitting-balance support: No upper extremity supported, Feet unsupported Sitting balance-Leahy Scale: Zero         Standing balance comment: unable                            Cognition Arousal/Alertness: Lethargic Behavior During Therapy: Flat affect Overall Cognitive Status: Difficult to assess Area of Impairment: Rancho level                               General Comments: Opening eyes when coughing or during rolling at bedlevel. No responce to painful stimuli.        Exercises General Exercises - Upper Extremity Elbow Flexion: PROM, Both, 10 reps, Supine Elbow Extension: PROM, Both, 10 reps, Supine Wrist Flexion: PROM, Both, 10 reps Wrist  Extension: PROM, Both, 10 reps, Supine Digit Composite Flexion: PROM, Both, 10 reps, Supine Composite Extension: PROM, Both, 10 reps, Supine Other Exercises Other Exercises: PROM of cervical spine; supine; x5 Other Exercises: hip extension; sidelying; x5 Other Exercises: trunk rotation in sidelying    General Comments General comments (skin  integrity, edema, etc.): VSS on trach via vent      Pertinent Vitals/Pain Pain Assessment Pain Assessment: Faces Faces Pain Scale: No hurt Pain Location: Opening eyes with rolling but no verbalization or change in vitals Pain Descriptors / Indicators: Grimacing Pain Intervention(s): Monitored during session, Repositioned     PT Goals (current goals can now be found in the care plan section) Acute Rehab PT Goals PT Goal Formulation: Patient unable to participate in goal setting Time For Goal Achievement: 04/30/21 Potential to Achieve Goals: Poor Progress towards PT goals: Not progressing toward goals - comment (need to complete family training, pt with no change in responsiveness)    Frequency    Min 2X/week (for family training)      PT Plan Frequency needs to be updated    Co-evaluation PT/OT/SLP Co-Evaluation/Treatment: Yes Reason for Co-Treatment: Complexity of the patient's impairments (multi-system involvement);Necessary to address cognition/behavior during functional activity;For patient/therapist safety;To address functional/ADL transfers PT goals addressed during session: Mobility/safety with mobility;Strengthening/ROM OT goals addressed during session: ADL's and self-care      AM-PAC PT "6 Clicks" Mobility   Outcome Measure  Help needed turning from your back to your side while in a flat bed without using bedrails?: Total Help needed moving from lying on your back to sitting on the side of a flat bed without using bedrails?: Total Help needed moving to and from a bed to a chair (including a wheelchair)?: Total Help needed standing up from a chair using your arms (e.g., wheelchair or bedside chair)?: Total Help needed to walk in hospital room?: Total Help needed climbing 3-5 steps with a railing? : Total 6 Click Score: 6    End of Session Equipment Utilized During Treatment: Oxygen Activity Tolerance: Patient limited by lethargy Patient left: in bed;with  call bell/phone within reach;with bed alarm set Nurse Communication: Mobility status PT Visit Diagnosis: Muscle weakness (generalized) (M62.81);Difficulty in walking, not elsewhere classified (R26.2);Other symptoms and signs involving the nervous system (R29.898);Adult, failure to thrive (R62.7)     Time: 1191-4782 PT Time Calculation (min) (ACUTE ONLY): 28 min  Charges:  $Therapeutic Exercise: 8-22 mins                     Vickki Muff, PT, DPT   Acute Rehabilitation Department Pager #: [phone]   Ronnie Derby 04/27/2021, 12:31 PM

## 2021-04-27 NOTE — Progress Notes (Signed)
Occupational Therapy Treatment Patient Details Name: Billy Cherry MRN: [id number] DOB: 09/07/1970 Today's Date: 04/27/2021   History of present illness 51 y.o. male who presented 03/23/21 as a code STEMI after being found unresponsive. V. fib cardiac arrest in setting of respiratory arrest due to suspected angioedema from ACE inhibitor leading to asphyxiation.  Concern on his 12 lead EKG for anterolateral ST elevation so he was taken to the cath lab where his study showed no significant coronary artery disease and an LVEF < 20%. 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 S/p cortak placement 1/27, trach placement 2/11. ETT 1/19 - 2/11. PMH: HTN   OT comments  Pt continues to require Total A for bed mobility and ADLs. Facilitating PROM of BUEs and cervical ROM. Pt continues to have tendency for cervical rotation to R at rest. Turning to L and notified RN. Chair position at end of session. Elevated BUEs. Continue to recommend dc to [facility] and follow up with family for education next Thursday.   Called and talked with wife via phone; she reports she does not plan on coming today as it [birth year] her [birth year]. Her next scheduled day off is next Thursday and she reports she will come "in the afternoon." Plan for between 12-4pm next Thursday for family education.    Recommendations for follow up therapy are one component of a multi-disciplinary discharge planning process, led by the attending physician.  Recommendations may be updated based on patient status, additional functional criteria and insurance authorization.    Follow Up Recommendations  OT at Long-term acute care hospital    Assistance Recommended at Discharge Frequent or constant Supervision/Assistance  Patient can return home with the following  Other (comment) (total a)   Equipment Recommendations  None recommended by OT     Recommendations for Other Services      Precautions / Restrictions Precautions Precautions: Fall;Other (comment) Precaution Comments: trach; cortrak Restrictions Weight Bearing Restrictions: No       Mobility Bed Mobility Overal bed mobility: Needs Assistance Bed Mobility: Rolling Rolling: Total assist         General bed mobility comments: Total A for rolling    Transfers                         Balance Overall balance assessment: Needs assistance Sitting-balance support: No upper extremity supported, Feet unsupported Sitting balance-Leahy Scale: Zero         Standing balance comment: unable                           ADL either performed or assessed with clinical judgement   ADL Overall ADL's : Needs assistance/impaired                                       General ADL Comments: Total A for washing face, bathing axillary area, and rolling in bed    Extremity/Trunk Assessment Upper Extremity Assessment Upper Extremity Assessment: LUE deficits/detail;RUE deficits/detail RUE Deficits / Details: No active movement. No responce to noxious stimuli. slight edema. LUE Deficits / Details: No active movement. No responce to noxious stimuli. slight edema.   Lower Extremity Assessment Lower Extremity Assessment: Defer to PT evaluation RLE Deficits / Details: No spasticity or clonus noted; Full  PROM noted in hip, knee, and ankle; no withdrawl to noxious stimuli, suggesting possibly no sensation LLE Deficits / Details: No spasticity or clonus noted; Full PROM noted in hip, knee, and ankle; no withdrawl to noxious stimuli, suggesting possibly no sensation        Vision       Perception     Praxis      Cognition Arousal/Alertness: Lethargic Behavior During Therapy: Flat affect Overall Cognitive Status: Difficult to assess Area of Impairment: Rancho level                               General Comments: Opening  eyes when coughing or during rolling at bedlevel. No responce to painful stimuli.        Exercises Exercises: General Upper Extremity, Other exercises, General Lower Extremity General Exercises - Upper Extremity Elbow Flexion: PROM, Both, 10 reps, Supine Elbow Extension: PROM, Both, 10 reps, Supine Wrist Flexion: PROM, Both, 10 reps Wrist Extension: PROM, Both, 10 reps, Supine Digit Composite Flexion: PROM, Both, 10 reps, Supine Composite Extension: PROM, Both, 10 reps, Supine Other Exercises Other Exercises: PROM of cervical spine; supine; x5 Other Exercises: hip extension; sidelying; x5 Other Exercises: trunk rotation in sidelying    Shoulder Instructions       General Comments VSS on trach via vent    Pertinent Vitals/ Pain       Pain Assessment Pain Assessment: Faces Faces Pain Scale: No hurt Pain Location: Opening eyes with rolling but no verbalization or change in vitals Pain Descriptors / Indicators: Grimacing Pain Intervention(s): Monitored during session  Home Living                                          Prior Functioning/Environment              Frequency  Min 1X/week        Progress Toward Goals  OT Goals(current goals can now be found in the care plan section)  Progress towards OT goals: Progressing toward goals  Acute Rehab OT Goals OT Goal Formulation: Patient unable to participate in goal setting Time For Goal Achievement: 05/01/21 Potential to Achieve Goals: Good ADL Goals Pt Will Perform Grooming: with max assist;bed level Additional ADL Goal #1: Pt's family will demonstrate understanding of ROM exercises at bedlevel Additional ADL Goal #2: Pt will demonstrate focused visual attention to object/face for 30 secs in preparation for ADLs  Plan Discharge plan remains appropriate;Frequency needs to be updated    Co-evaluation    PT/OT/SLP Co-Evaluation/Treatment: Yes Reason for Co-Treatment: To address functional/ADL  transfers;For patient/therapist safety   OT goals addressed during session: ADL's and self-care      AM-PAC OT "6 Clicks" Daily Activity     Outcome Measure   Help from another person eating meals?: Total Help from another person taking care of personal grooming?: Total Help from another person toileting, which includes using toliet, bedpan, or urinal?: Total Help from another person bathing (including washing, rinsing, drying)?: Total Help from another person to put on and taking off regular upper body clothing?: Total Help from another person to put on and taking off regular lower body clothing?: Total 6 Click Score: 6    End of Session    OT Visit Diagnosis: Unsteadiness on feet (R26.81);Other abnormalities of gait and mobility (R26.89);Muscle weakness (  generalized) (M62.81)   Activity Tolerance Patient limited by lethargy;Treatment limited secondary to medical complications (Comment)   Patient Left in bed;with call bell/phone within reach;with family/visitor present   Nurse Communication Mobility status        Time: 1121-1150 OT Time Calculation (min): 29 min  Charges: OT General Charges $OT Visit: 1 Visit OT Treatments $Therapeutic Exercise: 8-22 mins  Billy Cherry MSOT, OTR/L Acute Rehab Pager: [phone] Office: [phone]  Billy Cherry 04/27/2021, 12:13 PM

## 2021-04-27 NOTE — Progress Notes (Signed)
Referring Physician(s): Dr Wynelle Cleveland  Supervising Physician: Corrie Mckusick  Patient Status:  [facility] - In-pt  Chief Complaint:  Hypoxic brain injury dysphagia  Subjective:  Percutaneous gastric tube placement 04/26/21 Resting No response RN in room She has no issues   Allergies: Lisinopril  Medications: Prior to Admission medications   Medication Sig Start Date End Date Taking? Authorizing Provider  acetaminophen (TYLENOL) 325 MG tablet Place 2 tablets (650 mg total) into feeding tube every 6 (six) hours as needed for mild pain or fever. 03/30/21   Bradly Bienenstock, NP  carvedilol (COREG) 25 MG tablet Place 1 tablet (25 mg total) into feeding tube 2 (two) times daily with a meal. 03/30/21   Bradly Bienenstock, NP  Chlorhexidine Gluconate Cloth 2 % PADS Apply 6 each topically daily at 6 (six) AM. 03/31/21   Bradly Bienenstock, NP  chlorhexidine gluconate, MEDLINE KIT, (PERIDEX) 0.12 % solution 15 mLs by Mouth Rinse route 2 (two) times daily. 03/30/21   Bradly Bienenstock, NP  clevidipine (CLEVIPREX) 0.5 MG/ML EMUL Inject 0-21 mg/hr into the vein continuous. 03/30/21   Darel Hong D, NP  dextrose 5 % solution Inject 75 mL/hr into the vein continuous. 03/30/21   Bradly Bienenstock, NP  docusate (COLACE) 50 MG/5ML liquid Place 10 mLs (100 mg total) into feeding tube 2 (two) times daily as needed for mild constipation. 03/30/21   Bradly Bienenstock, NP  enoxaparin (LOVENOX) 60 MG/0.6ML injection Inject 0.55 mLs (55 mg total) into the skin daily. 03/30/21   Bradly Bienenstock, NP  hydrALAZINE (APRESOLINE) 20 MG/ML injection Inject 0.5-1 mLs (10-20 mg total) into the vein every 4 (four) hours as needed (SBP>180 DBP>105). 03/30/21   Bradly Bienenstock, NP  hydrALAZINE (APRESOLINE) 25 MG tablet Place 1 tablet (25 mg total) into feeding tube every 8 (eight) hours. 03/30/21   Bradly Bienenstock, NP  HYDROmorphone (DILAUDID) 1 MG/ML injection Inject 1 mL (1 mg total) into the vein every 30 (thirty) minutes  as needed (to achieve RASS & CPOT goal.). 03/30/21   Bradly Bienenstock, NP  HYDROmorphone (DILAUDID) 1 MG/ML injection Inject 1-3 mLs (1-3 mg total) into the vein every hour as needed (to achieve RASS & CPOT goal.). 03/30/21   Bradly Bienenstock, NP  insulin aspart (NOVOLOG) 100 UNIT/ML injection Inject 0-15 Units into the skin every 4 (four) hours. 03/30/21   Bradly Bienenstock, NP  isosorbide dinitrate (ISORDIL) 20 MG tablet Place 1 tablet (20 mg total) into feeding tube 3 (three) times daily. 03/30/21   Bradly Bienenstock, NP  labetalol (NORMODYNE) 5 MG/ML injection Inject 2 mLs (10 mg total) into the vein every 2 (two) hours as needed (Give second line if persistant hypertesion >160 (hold for HR <60)). 03/30/21   Darel Hong D, NP  levETIRAcetam 750 mg in sodium chloride 0.9 % 100 mL Inject 750 mg into the vein every 12 (twelve) hours. 03/30/21   Bradly Bienenstock, NP  Mouthwashes (MOUTH RINSE) LIQD solution 15 mLs by Mouth Rinse route every 4 (four) hours. 03/30/21   Bradly Bienenstock, NP  Nutritional Supplements (FEEDING SUPPLEMENT, PROSOURCE TF,) liquid Place 45 mLs into feeding tube 2 (two) times daily. 03/30/21   Bradly Bienenstock, NP  Nutritional Supplements (FEEDING SUPPLEMENT, VITAL HIGH PROTEIN,) LIQD liquid Place 1,000 mLs into feeding tube continuous. 03/30/21   Bradly Bienenstock, NP  ondansetron (ZOFRAN) 4 MG/2ML SOLN injection Inject 2 mLs (4 mg total) into the vein every 6 (  six) hours as needed for nausea. 03/30/21   Bradly Bienenstock, NP  pantoprazole sodium (PROTONIX) 40 mg Place 40 mg into feeding tube daily. 03/30/21   Bradly Bienenstock, NP  polyethylene glycol (MIRALAX / GLYCOLAX) 17 g packet Place 17 g into feeding tube daily as needed for mild constipation or moderate constipation. 03/30/21   Darel Hong D, NP  propofol (DIPRIVAN) 1000 MG/100ML EMUL injection Inject [phone] mcg/min into the vein continuous. 03/30/21   Darel Hong D, NP  sodium chloride 0.9 % infusion Inject 250  mLs into the vein as needed (for IV line care  (Saline / Heparin Lock)). 03/30/21   Darel Hong D, NP  sodium chloride 0.9 % infusion Inject 250 mLs into the vein continuous. 03/30/21   Darel Hong D, NP  sodium chloride flush (NS) 0.9 % SOLN Inject 3 mLs into the vein every 12 (twelve) hours. 03/30/21   Darel Hong D, NP  sodium chloride flush (NS) 0.9 % SOLN Inject 3 mLs into the vein as needed. 03/30/21   Bradly Bienenstock, NP  valproic acid (DEPAKENE) 250 MG/5ML solution Place 10 mLs (500 mg total) into feeding tube 3 (three) times daily. 03/30/21   Bradly Bienenstock, NP  Water For Irrigation, Sterile (FREE WATER) SOLN Place 100 mLs into feeding tube every 4 (four) hours. 03/30/21   Bradly Bienenstock, NP     Vital Signs: BP 119/90    Pulse 81    Temp 98.9 F (37.2 C) (Axillary)    Resp 12    Ht _0  (1.778 m)    Wt 234 lb 2.1 oz (106.2 kg)    SpO2 95%    BMI 33.59 kg/m   Physical Exam Skin:    General: Skin is warm.     Comments: Site is clean and dry No bleeding No infection No hematoma    Imaging: IR GASTROSTOMY TUBE MOD SED  Result Date: 04/26/2021 INDICATION: Hypoxic brain injury EXAM: Ultrasound and fluoroscopy guided percutaneous gastrostomy tube placement MEDICATIONS: Ancef 2 g IV; Antibiotics were administered within 1 hour of the procedure. Glucagon 1 mg IV ANESTHESIA/SEDATION: None CONTRAST:  15 mL Omnipaque 300-administered into the gastric lumen. FLUOROSCOPY: Radiation Exposure Index (as provided by the fluoroscopic device): 57 mGy Kerma COMPLICATIONS: None immediate. PROCEDURE: Informed written consent was obtained from the patient's wife after a thorough discussion of the procedural risks, benefits and alternatives. All questions were addressed. Maximal Sterile Barrier Technique was utilized including caps, mask, sterile gowns, sterile gloves, sterile drape, hand hygiene and skin antiseptic. A timeout was performed prior to the initiation of the procedure. An  orogastric tube was placed with fluoroscopic guidance. The anterior abdomen was prepped and draped in sterile fashion. Ultrasound evaluation of the left upper quadrant was performed to confirm the position of the liver. The skin and subcutaneous tissues were anesthetized with 1% lidocaine. 17 gauge needle was directed into the distended stomach with fluoroscopic guidance. A wire was advanced into the stomach. 9-French vascular sheath was placed and the orogastric tube was snared using a Gooseneck snare device. The orogastric tube and snare were pulled out of the patient's mouth. The snare device was connected to a 20-French gastrostomy tube. The snare device and gastrostomy tube were pulled through the patient's mouth and out the anterior abdominal wall. The gastrostomy tube was cut to an appropriate length. Contrast injection through gastrostomy tube confirmed placement within the stomach. Fluoroscopic images were obtained for documentation. The gastrostomy tube was flushed with normal  saline. IMPRESSION: 20 pull-through percutaneous gastrostomy placement as above. Electronically Signed   By: Miachel Roux M.D.   On: 04/26/2021 13:53    Labs:  CBC: Recent Labs    04/22/21 0417 04/23/21 0517 04/24/21 0344 04/25/21 0246  WBC 14.6* 13.1* 12.5* 12.5*  HGB 11.7* 11.5* 11.3* 11.1*  HCT 37.6* 35.9* 34.3* 35.3*  PLT 197 222 198 186    COAGS: Recent Labs    03/23/21 0254 04/20/21 0816  INR 1.0 1.3*  APTT 29 32    BMP: Recent Labs    04/22/21 0417 04/23/21 0517 04/24/21 0344 04/25/21 0246  NA 145 143 143 142  K 3.9 4.2 4.0 4.3  CL 107 107 107 106  CO2 _0 GLUCOSE 106* 109* 115* 108*  BUN 52* 42* 37* 33*  CALCIUM 8.2* 7.9* 8.0* 7.9*  CREATININE 1.19 1.04 0.95 0.78  GFRNONAA >60 >60 >60 >60    LIVER FUNCTION TESTS: Recent Labs    04/16/21 0051 04/17/21 0503 04/20/21 0816 04/20/21 1330  BILITOT 0.2* 0.7 0.3 0.2*  AST 39 34 38 39  ALT _1 ALKPHOS 41 40 36* 40   PROT 7.1 7.7 6.9 6.8  ALBUMIN 2.0* 2.1* 1.8* 1.8*    Assessment and Plan:  Perc G tube placed in IR yesterday May use now  Electronically Signed: Lavonia Drafts, PA-C 04/27/2021, 6:27 AM   I spent a total of 15 Minutes at the the patient's bedside AND on the patient's hospital floor or unit, greater than 50% of which was counseling/coordinating care for perc gastric tube

## 2021-04-27 NOTE — Progress Notes (Signed)
PROGRESS NOTE  Billy Cherry [id number] DOB: December 05, 1970 DOA: 03/30/2021 PCP: Patient, No Pcp Per (Inactive)  Brief History   51 y/o male with HTN who was found to be unresponsive by his wife. It tooke 5-10 min for EMS to arrive. EMS found him to be in V fib with agonal respirations. Defibrillated once and then went into asystole. Given 2 round of Epinephrineand had ROSC.  Futher history later obtained from wife that the patient had  angioedema however there is no report of this per multiple physician's notes from admission.   EKG after ROSC showed anterolateral ST elevation and he was taken to the cath lab. Dr Josefa Half performed cath> insignificant CAD and EF 45-50%. Patient remained on ventilator after cath and was admitted to the ICU. Underwent TTM. Given Propofol.  1/19- neuro consulted- noted to have rhythmic twitching of his lips/tongue and was started on keppra   MRI brain> hypoxic ischemic injury involving bilateral ACA-MCA watershed cortex, bilat occipital cortex and BG ECHO showed EF of 20% with global hypokinesis. Treated for VAP.    1/25> showed episodes of eye opening and lip twitching, more prominent when stimulated consistent with myoclonic seizures.  Additionally EEG was suggestive of severe diffuse encephalopathy.  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   1/27- Neuro, Dr Hortense Ramal had [place] conversation with wife who stated that she wanted to continue aggressive care   2/2 palliative care note> wife did not show up for meeting- wife was called  and noted that she "hopes to push out any decisions regarding her husband treatment  until his mother/[name] comes to [place].  Unfortunately she will not be here until February 17th"   2/6 new fever   2/10> PCCM and wife did consent for trach and decided to pursue PEG as well - resp culture revealed MSSA and Flavobacterium Odoratum- placed on Vanc for 7 day course   2/11 trach  placed 2/16 recurrent fever 2/19 sputum culture grew pseudomonas see below for details - IR consulted for PEG tube after speaking with wife. 2/20 Change trach to 6 cuffless  2/22 This author completed the paperwork left by the patient's wife as requested. Patient went for PEG placement as consented by the patient's wife earlier this week. Tube feeds held during this process and restarted afterwards.  2/23 - Cortrak removed and PEG tube is being used.  Consultants  Interventional radiology Pulmonary critical care Palliative care Cardiology ENT  Procedures  Mechanical ventilation Tracheostomy PEG tube placement  Antibiotics   Anti-infectives (From admission, onward)    Start     Dose/Rate Route Frequency Ordered Stop   04/26/21 0756  ceFAZolin (ANCEF) 2-4 GM/100ML-% IVPB  Status:  Discontinued       Note to Pharmacy: [name] L: cabinet override      04/26/21 0756 04/26/21 0855   04/26/21 0600  ceFAZolin (ANCEF) IVPB 2g/100 mL premix        2 g 200 mL/hr over 30 Minutes Intravenous Every 8 hours 04/25/21 1131 04/26/21 0915   04/23/21 1400  cefTAZidime (FORTAZ) 1 g in sodium chloride 0.9 % 100 mL IVPB        1 g 200 mL/hr over 30 Minutes Intravenous Every 8 hours 04/23/21 1256 04/25/21 2331   04/20/21 1300  ceFEPIme (MAXIPIME) 2 g in sodium chloride 0.9 % 100 mL IVPB  Status:  Discontinued        2 g 200 mL/hr over 30 Minutes Intravenous  Every 8 hours 04/20/21 1205 04/23/21 1241   04/14/21 0930  vancomycin (VANCOREADY) IVPB 1250 mg/250 mL        1,250 mg 166.7 mL/hr over 90 Minutes Intravenous Every 24 hours 04/13/21 0902 04/19/21 1247   04/13/21 0930  vancomycin (VANCOREADY) IVPB 2000 mg/400 mL        2,000 mg 200 mL/hr over 120 Minutes Intravenous  Once 04/13/21 0831 04/13/21 1158   04/12/21 0815  ceFEPIme (MAXIPIME) 2 g in sodium chloride 0.9 % 100 mL IVPB  Status:  Discontinued        2 g 200 mL/hr over 30 Minutes Intravenous Every 8 hours 04/12/21 0724 04/13/21  0829   04/06/21 1800  ceFAZolin (ANCEF) IVPB 2g/100 mL premix        2 g 200 mL/hr over 30 Minutes Intravenous Every 8 hours 04/06/21 1403 04/10/21 1913   04/04/21 1000  ceFEPIme (MAXIPIME) 2 g in sodium chloride 0.9 % 100 mL IVPB  Status:  Discontinued        2 g 200 mL/hr over 30 Minutes Intravenous Every 8 hours 04/04/21 0919 04/06/21 1403        Subjective  The patient is only minimally responsive with phonation only to stimulation.  Objective   Vitals:  Vitals:   04/27/21 1507 04/27/21 1520  BP: (!) 133/94   Pulse: 81   Resp: (!) 22   Temp: 98.9 F (37.2 C)   SpO2: 100% 98%    Exam:  Constitutional:  The patient is not verbally responsive. No acute Respiratory:  No increased work of breathing. No wheezes, rales, or rhonchi No tactile fremitus Cardiovascular:  Regular rate and rhythm No murmurs, ectopy, or gallups. No lateral PMI. No thrills. Abdomen:  Abdomen is soft, non-tender, non-distended No hernias, masses, or organomegaly Normoactive bowel sounds.  Musculoskeletal:  No cyanosis, clubbing, or edema Skin:  No rashes, lesions, ulcers palpation of skin: no induration or nodules Neurologic:  CN 2-12 intact Sensation all 4 extremities intact Psychiatric:  Mental status Mood, affect appropriate Orientation to person, place, time  judgment and insight appear intact  I have personally reviewed the following:   Today's Data  Vitals  Lab Data    Micro Data  Respiratory culture has grown pseudomonas on 04/20/2021  Imaging  CXR CT head CT abdomen with contrast  Cardiology Data  EKG Echocardiogram LHC  Other Data    Scheduled Meds:  amLODipine  10 mg Per Tube Daily   atropine  2 drop Sublingual QID   carvedilol  25 mg Per Tube BID   chlorhexidine gluconate (MEDLINE KIT)  15 mL Mouth Rinse BID   clonazepam  0.5 mg Per Tube TID   doxazosin  2 mg Per Tube Daily   feeding supplement (PROSource TF)  45 mL Per Tube BID   fiber  1 packet  Per Tube BID   free water  400 mL Per Tube Q6H   Gerhardt's butt cream   Topical Daily   heparin  5,000 Units Subcutaneous Q8H   hydrALAZINE  50 mg Per Tube Q8H   isosorbide dinitrate  20 mg Per Tube TID   levETIRAcetam  750 mg Per Tube BID   mouth rinse  15 mL Mouth Rinse 10 times per day   pantoprazole sodium  40 mg Per Tube QHS   scopolamine  1 patch Transdermal Q72H   valproic acid  750 mg Per Tube TID   Continuous Infusions:  feeding supplement (JEVITY 1.5 CAL/FIBER) 1,000 mL (04/27/21  0330)    Principal Problem:   Cardiac arrest ([place]) Active Problems:   Acute respiratory failure ([name])   Myoclonus   Anoxic encephalopathy ([name])   Hypernatremia   HFrEF (heart failure with reduced ejection fraction) ([name])   RLL pneumonia   Sepsis ([place])   HTN (hypertension)   LOS: 28 days   A & P  Assessment and Plan: * Cardiac arrest ([place])- (present on admission) With resultant anoxic encephalopathy, respiratory failure, EF of < 20% -has tracheostomy and cor trak - PEG placed on 04/26/2021. Will begin to use today as per IR instructions. Cortrak has been removed.   RLL pneumonia Sepsis- pseudomonas pneumonia - completed 7 day of Vanc on 2/15 for MSSA and [name] -2/16>  having fevers as high at 101- lactic acid normal, RR in 30-40s, tachycardic, large amounts of yellow and bloody sputum - WBC up to 16 from 14 - blood and sputum cultures ordered -2/16 CXR > LLL opacity improved- RLL opacity worse and now has RLL effusion - started Cefepime -- likely has aspirated - SARS COVID neg - sputum culture growing pseudomonas aeruginosa -sensitive to Ceftaz- recommend 7 days with last dose to be on 2/21 - temps occasionally ~ 100.5 - suspect this remaining fever is neurogenic as he has improved clinically- cont to follow plz -No fevers in the past 24 hours.   Anoxic encephalopathy ([place])- (present on admission) - obtunded   Acute respiratory failure ([place])- (present on  admission) - trach changed to size 6 cuffless on 2/20   HTN (hypertension) - severe uncontrolled HTN likely contributed to cardiac arrest - cont Coreg, Isordil, amlodipine, Cardura, Hydralazine and PRN Labetalol   HFrEF (heart failure with reduced ejection fraction) ([name]) - cont coreg, imdur, hydralazine - fluid management with tube feeds   Hypernatremia - resolved, follow intermittently - cont free water 400 cc Q 6 hrs for now   Myoclonus- (present on admission) - cont AEDs and benzodiazepines per Neuro- also per neuro notes, suppressing his myoclonus will not ultimately change the prognosis   I have seen and examined this patient myself. I have spent 32 minutes in his evaluation and care.  DVT prophylaxis: heparin Code Status: Full Code Family Communication: None available Disposition Plan: TBD    Reshard Guillet, DO [facility] Direct contact: see [email or web address]  7PM-7AM contact night coverage as above 04/27/2021, 4:38 PM  LOS: 27 days

## 2021-04-27 NOTE — Significant Event (Signed)
Patient Wife called for an update and stated that she will be here as soon as she can that she is stuck in a storm in [place] with traffic.

## 2021-04-27 NOTE — Progress Notes (Signed)
Nutrition Follow-up  DOCUMENTATION CODES:   Not applicable  INTERVENTION:   Tube Feeding via PEG: Jevity 1.5 @ 60 mL/hr (1440 mL/day) 45 mL ProSource TF - BID Continue 400 mL free water q6h per MD Provides 2240 kcal, 114 gm PRO, and 1094 mL free water (2694 mL total free water) daily.   Recommend discontinuing Cortrak. MD messaged.   NUTRITION DIAGNOSIS:   Inadequate oral intake related to inability to eat as evidenced by NPO status. - Ongoing, being addressed via TF  GOAL:   Patient will meet greater than or equal to 90% of their needs - Being addressed via TF  MONITOR:   TF tolerance, Weight trends, Labs, Skin  REASON FOR ASSESSMENT:   Ventilator, Consult Enteral/tube feeding initiation and management  ASSESSMENT:   51 year old male who originally [facility] after out-of-hospital cardiac arrest due to suspected angioedema from ACE inhibitor leading to asphyxiation. PMH of HTN, HLD.  2/11 - tracheostomy placed 2/22 - PEG placed   RD received a consult to initiate tube feeds after PEG placement.  Will resume current TF regimen that was being used prior.   Will continue to monitor for transition to bolus feeds prior to discharge.   Medications reviewed and include: Fiber, Protonix Labs reviewed: BUN 29  Diet Order:   Diet Order     None       EDUCATION NEEDS:   Not appropriate for education at this time  Skin:  Skin Assessment: Skin Integrity Issues: Skin Integrity Issues:: Other (Comment) Other: MASD buttocks  Last BM:  2/21 - Type 7  Height:   Ht Readings from Last 1 Encounters:  04/18/21 5\' 10"  (1.778 m)    Weight:   Wt Readings from Last 1 Encounters:  03/30/21 101.8 kg    Ideal Body Weight:  75.5 kg  BMI:  Body mass index is 33.59 kg/m.  Estimated Nutritional Needs:   Kcal:  2200-2400  Protein:  110-125 grams  Fluid:  >/= 2.2 L   04/01/21 RD, LDN Clinical Dietitian See [facility] for contact  information.

## 2021-04-28 LAB — CBC WITH DIFFERENTIAL/PLATELET
Abs Immature Granulocytes: 0.11 10*3/uL — ABNORMAL HIGH (ref 0.00–0.07)
Basophils Absolute: 0 10*3/uL (ref 0.0–0.1)
Basophils Relative: 0 %
Eosinophils Absolute: 0.2 10*3/uL (ref 0.0–0.5)
Eosinophils Relative: 2 %
HCT: 35.5 % — ABNORMAL LOW (ref 39.0–52.0)
Hemoglobin: 11.6 g/dL — ABNORMAL LOW (ref 13.0–17.0)
Immature Granulocytes: 1 %
Lymphocytes Relative: 21 %
Lymphs Abs: 2.4 10*3/uL (ref 0.7–4.0)
MCH: 30.8 pg (ref 26.0–34.0)
MCHC: 32.7 g/dL (ref 30.0–36.0)
MCV: 94.2 fL (ref 80.0–100.0)
Monocytes Absolute: 1.3 10*3/uL — ABNORMAL HIGH (ref 0.1–1.0)
Monocytes Relative: 11 %
Neutro Abs: 7.5 10*3/uL (ref 1.7–7.7)
Neutrophils Relative %: 65 %
Platelets: 175 10*3/uL (ref 150–400)
RBC: 3.77 MIL/uL — ABNORMAL LOW (ref 4.22–5.81)
RDW: 14.1 % (ref 11.5–15.5)
WBC: 11.5 10*3/uL — ABNORMAL HIGH (ref 4.0–10.5)
nRBC: 0 % (ref 0.0–0.2)

## 2021-04-28 NOTE — Progress Notes (Signed)
Progress Note   Patient: Billy Cherry [id number] DOB: 07/05/1970 DOA: 03/30/2021     29 DOS: the patient was seen and examined on 04/28/2021   Brief hospital course: 51 y/o male with HTN who was found to be unresponsive by his wife. It tooke 5-10 min for EMS to arrive. EMS found him to be in V fib with agonal respirations. Defibrillated once and then went into asystole. Given 2 round of Epinephrineand had ROSC.  Futher history later obtained from wife that the patient had  angioedema however there is no report of this per multiple physician's notes from admission.  EKG after ROSC showed anterolateral ST elevation and he was taken to the cath lab. Dr Josefa Half performed cath> insignificant CAD and EF 45-50%. Patient remained on ventilator after cath and was admitted to the ICU. Underwent TTM. Given Propofol.  1/19- neuro consulted- noted to have rhythmic twitching of his lips/tongue and was started on keppra. MRI brain> hypoxic ischemic injury involving bilateral ACA-MCA watershed cortex, bilat occipital cortex and BG. ECHO showed EF of 20% with global hypokinesis. Treated for VAP.   1/25> showed episodes of eye opening and lip twitching, more prominent when stimulated consistent with myoclonic seizures.  Additionally EEG was suggestive of severe diffuse encephalopathy.  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  1/27- Neuro, Dr Hortense Ramal had [place] conversation with wife who stated that she wanted to continue aggressive care  2/2 palliative care note> wife did not show up for meeting- wife was called  and noted that she "hopes to push out any decisions regarding her husband treatment  until his mother/[name] comes to [place].  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- placed on Vanc  for 7 day course  2/11 trach placed 2/16 recurrent fever 2/19 sputum culture grew pseudomonas see below for details. Received Cefepime.  - IR consulted for PEG tube after speaking with wife. 2/20 Change trach to 6 cuffless   Assessment and Plan: * Cardiac arrest ([place])- (present on admission) With  resultant anoxic encephalopathy, respiratory failure, EF of < 20% -has tracheostomy and cor trak - S/P Peg tube placement.    HTN (hypertension) - severe uncontrolled HTN likely contributed to cardiac arrest - Cont Coreg, Isordil, amlodipine, Cardura, Hydralazine and PRN Labetalol  RLL pneumonia Sepsis- pseudomonas pneumonia - Completed 7 day of Vanc on 2/15 for MSSA and [name] -2/16>  having fevers as high at 101- lactic acid normal, RR in 30-40s, tachycardic, large amounts of yellow and bloody sputum -2/16 CXR > LLL opacity improved- RLL opacity worse and now has RLL effusion - SARS COVID neg - Sputum culture growing pseudomonas aeruginosa -Sensitive to Ceftaz- Completed antibiotics for Pseudomonas 2/21. -Afebrile.   HFrEF (heart failure with reduced ejection fraction) ([name]) - Cont coreg, imdur, hydralazine - fluid management with tube feeds  Hypernatremia - Resolved, follow intermittently - Cont free water 400 cc Q 6 hrs for now  Anoxic encephalopathy ([place])- (present on admission) -Obtunded. Not following command  Myoclonus- (present on admission) - Cont AEDs and benzodiazepines per Neuro- also per neuro notes, suppressing his myoclonus will not ultimately change the prognosis On klonopin.   Acute respiratory failure ([place])- (present on admission) -Trach changed to size 6 cuffless on 2/20. -RT following.  -Trach 5 L  Oxygen.         Subjective: no responsive.   Physical Exam: Vitals:   04/28/21 0400 04/28/21 0600 04/28/21 0822 04/28/21 1037  BP: (!) 148/104 (!) 146/104 (!) 151/97 (!) 135/100  Pulse: 86 82  80  Resp: (!) $RemoveB'24 17  18  'mwBHqLOW$ Temp:   98.4 F (36.9 C)  99.5 F (37.5 C)  TempSrc:  Axillary  Axillary  SpO2: 100% 100%  99%  Weight:      Height:       General; No responsive.  CVS; S 1, S2 RR Lung; Trach in place BL air movement.   Data Reviewed:  CBC and B-met reviewed.   Family Communication: Wife over phone.   Disposition: Status is: Inpatient Remains inpatient appropriate because: Awaiting safe discharge plan.           Planned Discharge Destination: Home     Time spent: 45 minutes  Author: Elmarie Shiley, MD 04/28/2021 4:32 PM  For on call review [email or web address].

## 2021-04-28 NOTE — Progress Notes (Signed)
SLP Cancellation Note  Patient Details Name: Billy Cherry MRN: [id number] DOB: [birth year]   Cancelled treatment:       Reason Eval/Treat Not Completed: Other (comment) No family present at the moment to provide education. Per NT, may be present later today. Will f/u as able.    Mahala Menghini., M.A. CCC-SLP Acute Rehabilitation Services Pager [phone] Office [phone]  04/28/2021, 11:38 AM

## 2021-04-28 NOTE — TOC Progression Note (Signed)
Transition of Care [facility]) - Progression Note    Patient Details  Name: Tarell Mcnichol MRN: [id number] Date of Birth: 01/06/1971  Transition of Care [facility]) CM/SW Contact  Zenon Mayo, RN Phone Number: 04/28/2021, 4:11 PM  Clinical Narrative:    NCM spoke with [name] with [employer], he sent over medical necessity and Plan of Care for MD to sign. NCM also spoke with wife , she gave this NCM permission to send rererral in for place for Mom as well.  Plan to hear back from [place] next week regarding if received authorization from insurance company.   Expected Discharge Plan: Long Term Acute Care (LTAC) Barriers to Discharge: Continued Medical Work up  Expected Discharge Plan and Services Expected Discharge Plan: Long Term Acute Care (LTAC) In-house Referral: Hospice / Palliative Care Discharge Planning Services: CM Consult Post Acute Care Choice: Long Term Acute Care (LTAC) Living arrangements for the past 2 months: Single Family Home                 DME Arranged: N/A                     Social Determinants of Health (SDOH) Interventions    Readmission Risk Interventions Readmission Risk Prevention Plan 04/14/2021  Transportation Screening Complete  HRI or Home Care Consult Complete  Social Work Consult for [place] Planning/Counseling Complete  Palliative Care Screening Complete  Medication Review [employer]) Complete  Some recent data might be hidden

## 2021-04-29 NOTE — Plan of Care (Signed)

## 2021-04-29 NOTE — Progress Notes (Signed)
Progress Note   Patient: Billy Cherry [id number] DOB: April 01, 1970 DOA: 03/30/2021     30 DOS: the patient was seen and examined on 04/29/2021   Brief hospital course: 51 y/o male with HTN who was found to be unresponsive by his wife. It tooke 5-10 min for EMS to arrive. EMS found him to be in V fib with agonal respirations. Defibrillated once and then went into asystole. Given 2 round of Epinephrineand had ROSC.  Futher history later obtained from wife that the patient had  angioedema however there is no report of this per multiple physician's notes from admission.  EKG after ROSC showed anterolateral ST elevation and he was taken to the cath lab. Dr Cassie Freer performed cath> insignificant CAD and EF 45-50%. Patient remained on ventilator after cath and was admitted to the ICU. Underwent TTM. Given Propofol.  1/19- neuro consulted- noted to have rhythmic twitching of his lips/tongue and was started on keppra. MRI brain> hypoxic ischemic injury involving bilateral ACA-MCA watershed cortex, bilat occipital cortex and BG. ECHO showed EF of 20% with global hypokinesis. Treated for VAP.   1/25> showed episodes of eye opening and lip twitching, more prominent when stimulated consistent with myoclonic seizures.  Additionally EEG was suggestive of severe diffuse encephalopathy.  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  1/27- Neuro, Dr Melynda Ripple had [place] conversation with wife who stated that she wanted to continue aggressive care  2/2 palliative care note> wife did not show up for meeting- wife was called  and noted that she "hopes to push out any decisions regarding her husband treatment  until his mother/[name] comes to [place].  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- placed on Vanc  for 7 day course  2/11 trach placed 2/16 recurrent fever 2/19 sputum culture grew pseudomonas see below for details. Received Cefepime.  - IR consulted for PEG tube after speaking with wife. 2/20 Change trach to 6 cuffless   Transition of care team, helping arrange home health.   Assessment and Plan: * Cardiac arrest ([name])- (present on admission) With  resultant anoxic encephalopathy, respiratory failure, EF of < 20% -has tracheostomy and cor trak - S/P Peg tube placement.    HTN (hypertension) - severe uncontrolled HTN likely contributed to cardiac arrest - Cont Coreg, Isordil, amlodipine, Cardura, Hydralazine and PRN Labetalol  RLL pneumonia Sepsis- pseudomonas pneumonia - Completed 7 day of Vanc on 2/15 for MSSA and [name] -2/16>  having fevers as high at 101- lactic acid normal, RR in 30-40s, tachycardic, large amounts of yellow and bloody sputum -2/16 CXR > LLL opacity improved- RLL opacity worse and now has RLL effusion - SARS COVID neg - Sputum culture growing pseudomonas aeruginosa -Sensitive to Ceftaz- Completed antibiotics for Pseudomonas 2/21. -Afebrile.   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 Not following command  Myoclonus- (present on admission) - Cont AEDs and benzodiazepines per Neuro- also per neuro notes, suppressing his myoclonus will not ultimately change the prognosis On klonopin.   Acute respiratory failure ([name])- (present on admission) -Trach changed to size  6 cuffless on 2/20. -RT following.  -Trach 5 L Oxygen.         Subjective:  No responsive.   Physical Exam: Vitals:   04/29/21 0835 04/29/21 1000 04/29/21 1114 04/29/21 1509  BP: (!) 161/110 (!) 147/94    Pulse: 87 85  88   Resp: (!) 21 (!) 22 (!) 22 20  Temp: 99.2 F (37.3 C)     TempSrc: Oral     SpO2: 99% 97%    Height:       General; Obtunded.  CVS; S 1, S 2 RRR Lung: trach in place, BL crackle.   Data Reviewed:  No labs today.   Family Communication: Wife updated 2/24  Disposition: Status is: Inpatient Remains inpatient appropriate because: arranging safe discharge plan.           Planned Discharge Destination: Home     Time spent: 45 minutes  Author: Alba Cory, MD 04/29/2021 4:20 PM  For on call review [email or web address].

## 2021-04-29 NOTE — Progress Notes (Signed)
°   04/29/21 0835  Vitals  Temp 99.2 F (37.3 C)  Temp Source Oral  BP (!) 161/110  MAP (mmHg) (!) 15  BP Location Left Arm  BP Method Automatic  Pulse Rate 87  ECG Heart Rate 88  Resp (!) 21  MEWS COLOR  MEWS Score Color Red  Oxygen Therapy  SpO2 99 %  O2 Device Tracheostomy Collar  O2 Flow Rate (L/min) 5 L/min  FiO2 (%) 28 %  Patient Activity (if Appropriate) In bed  Pulse Oximetry Type Continuous  SpO2 Alarm Limit Low 97  Oximetry Probe Site Changed Yes  Pain Assessment  Pain Scale Faces  Pain Score 0  Faces Pain Scale 0  Pain Orientation Left  MEWS Score  MEWS Temp 0  MEWS Systolic 0  MEWS Pulse 0  MEWS RR 1  MEWS LOC 3  MEWS Score 4   No change chronic High MEWS

## 2021-04-30 LAB — CBC
HCT: 36.3 % — ABNORMAL LOW (ref 39.0–52.0)
Hemoglobin: 11.4 g/dL — ABNORMAL LOW (ref 13.0–17.0)
MCH: 30 pg (ref 26.0–34.0)
MCHC: 31.4 g/dL (ref 30.0–36.0)
MCV: 95.5 fL (ref 80.0–100.0)
Platelets: 191 10*3/uL (ref 150–400)
RBC: 3.8 MIL/uL — ABNORMAL LOW (ref 4.22–5.81)
RDW: 14.5 % (ref 11.5–15.5)
WBC: 11.5 10*3/uL — ABNORMAL HIGH (ref 4.0–10.5)
nRBC: 0 % (ref 0.0–0.2)

## 2021-04-30 LAB — BASIC METABOLIC PANEL
Anion gap: 8 (ref 5–15)
BUN: 29 mg/dL — ABNORMAL HIGH (ref 6–20)
CO2: 31 mmol/L (ref 22–32)
Calcium: 8.5 mg/dL — ABNORMAL LOW (ref 8.9–10.3)
Chloride: 99 mmol/L (ref 98–111)
Creatinine, Ser: 0.84 mg/dL (ref 0.61–1.24)
GFR, Estimated: >60 mL/min (ref >60)
Glucose, Bld: 105 mg/dL — ABNORMAL HIGH (ref 70–99)
Potassium: 4.3 mmol/L (ref 3.5–5.1)
Sodium: 138 mmol/L (ref 135–145)

## 2021-04-30 NOTE — Progress Notes (Signed)
SpO2 86%. Patient suctioned through trach x2 using sterile technique. Medium thick tan secretions coughed up from trach. SpO2 94%. Patient tolerated well. No distress noted after suctioning

## 2021-04-30 NOTE — Progress Notes (Signed)
°   04/30/21 0714  Assess: MEWS Score  Temp 99 F (37.2 C)  BP 113/82  Pulse Rate 78  Resp 20  SpO2 94 %  O2 Device Tracheostomy Collar  Assess: MEWS Score  MEWS Temp 0  MEWS Systolic 0  MEWS Pulse 0  MEWS RR 0  MEWS LOC 3  MEWS Score 3  MEWS Score Color Yellow  Assess: if the MEWS score is Yellow or Red  Were vital signs taken at a resting state? Yes  Focused Assessment No change from prior assessment  Early Detection of Sepsis Score *See Row Information* Low  MEWS guidelines implemented *See Row Information* No, previously yellow, continue vital signs every 4 hours  Treat  MEWS Interventions Administered scheduled meds/treatments  Pain Scale CPOT  Facial Expression 0  Body Movements 0  Muscle Tension 0  Compliance with ventilator (intubated pts.) N/A  Vocalization (extubated pts.) N/A  CPOT Total 0

## 2021-04-30 NOTE — Progress Notes (Signed)
Bed scale not functioning. Unable to obtain morning weight

## 2021-04-30 NOTE — Progress Notes (Signed)
Progress Note   Patient: Billy Cherry [id number]:[id number] DOB: 1970-07-31 DOA: 03/30/2021     31 DOS: the patient was seen and examined on 04/30/2021   Brief hospital course: 51 y/o male with HTN who was found to be unresponsive by his wife. It tooke 5-10 min for EMS to arrive. EMS found him to be in V fib with agonal respirations. Defibrillated once and then went into asystole. Given 2 round of Epinephrineand had ROSC.  Futher history later obtained from wife that the patient had  angioedema however there is no report of this per multiple physician's notes from admission.  EKG after ROSC showed anterolateral ST elevation and he was taken to the cath lab. Dr Cassie Freer performed cath> insignificant CAD and EF 45-50%. Patient remained on ventilator after cath and was admitted to the ICU. Underwent TTM. Given Propofol.  1/19- neuro consulted- noted to have rhythmic twitching of his lips/tongue and was started on keppra. MRI brain> hypoxic ischemic injury involving bilateral ACA-MCA watershed cortex, bilat occipital cortex and BG. ECHO showed EF of 20% with global hypokinesis. Treated for VAP.   1/25> showed episodes of eye opening and lip twitching, more prominent when stimulated consistent with myoclonic seizures.  Additionally EEG was suggestive of severe diffuse encephalopathy.  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  1/27- Neuro, Dr Melynda Ripple had GOC conversation with wife who stated that she wanted to continue aggressive care  2/2 palliative care note> wife did not show up for meeting- wife was called  and noted that she "hopes to push out any decisions regarding her husband treatment  until his mother/[name] comes to [place].  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- placed on Vanc  for 7 day course  2/11 trach placed 2/16 recurrent fever 2/19 sputum culture grew pseudomonas see below for details. Received Cefepime.  - IR consulted for PEG tube after speaking with wife. 2/20 Change trach to 6 cuffless   Transition of care team, helping arrange home health.   Assessment and Plan: * Cardiac arrest ([name])- (present on admission) Cardiac arrest with  resultant anoxic encephalopathy, respiratory failure, EF of < 20% -has tracheostomy and cor trak - S/P Peg tube placement.    HTN (hypertension) - Severe uncontrolled HTN likely contributed to cardiac arrest - Cont Coreg, Isordil, amlodipine, Cardura, Hydralazine and PRN Labetalol  RLL pneumonia Sepsis- pseudomonas pneumonia - Completed 7 day of Vanc on 2/15 for MSSA and [name] -2/16>  having fevers as high at 101- lactic acid normal, RR in 30-40s, tachycardic, large amounts of yellow and bloody sputum -2/16 CXR > LLL opacity improved- RLL opacity worse and now has RLL effusion - SARS COVID neg - Sputum culture growing pseudomonas aeruginosa -Sensitive to Ceftaz- Completed antibiotics for Pseudomonas 2/21. -Afebrile.   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 Not following command He open eyes today, but not by command.   Myoclonus- (present on admission) - Cont AEDs and benzodiazepines per Neuro- also per neuro notes, suppressing his myoclonus will not ultimately change the prognosis On klonopin.  Acute respiratory failure ([name])- (present on admission) -Trach changed to size 6 cuffless on 2/20. -RT following.  -Trach 5 L Oxygen.  Continue with suctioning.         Subjective: open eyes today, but not by command.   Physical Exam: Vitals:    04/30/21 1131 04/30/21 1401 04/30/21 1433 04/30/21 1453  BP: (!) 148/100 (!) 156/108  (!) 149/100  Pulse: 82   90  Resp: 20  18 20   Temp: 99.1 F (37.3 C)     TempSrc: Axillary     SpO2: 93%   97%  Height:       General; NAD, Obtunded.  CVS; S 1, S 2 RRR Lung; Trach; BL ronchus   Data Reviewed:  Cbc and Bmet reviewed.   Family Communication: Wife over phone 2/25  Disposition: Status is: Inpatient Remains inpatient appropriate because: awaiting safe discharge plan.           Planned Discharge Destination: Home     Time spent: 45 minutes  Author: 3/25, MD 04/30/2021 3:14 PM  For on call review [email or web address].

## 2021-05-01 ENCOUNTER — Inpatient Hospital Stay (HOSPITAL_COMMUNITY): Payer: BC Managed Care – PPO

## 2021-05-01 DIAGNOSIS — R1310 Dysphagia, unspecified: Secondary | ICD-10-CM

## 2021-05-01 MED ORDER — SODIUM CHLORIDE 3 % IN NEBU
4.0000 mL | INHALATION_SOLUTION | Freq: Two times a day (BID) | RESPIRATORY_TRACT | Status: AC
Start: 2021-05-01 — End: 2021-05-04
  Administered 2021-05-01 – 2021-05-03 (×5): 4 mL via RESPIRATORY_TRACT
  Filled 2021-05-01 (×6): qty 4

## 2021-05-01 NOTE — Progress Notes (Signed)
°  NAME:  Billy Cherry, MRN:  [id number], DOB:  [birth year], LOS: 65 ADMISSION DATE:  03/30/2021, CONSULTATION DATE:  1/19 REFERRING MD:  Lorinda Creed, CHIEF COMPLAINT:  Found down, cardiac arrest   Brief Pt Description / Synopsis:  51 year old male with out-of-hospital V. fib cardiac arrest in setting of respiratory arrest due to suspected angioedema from ACE inhibitor leading to asphyxiation.  Concern on his 12 lead EKG for anterolateral ST elevation so he was taken to the cath lab where his study showed no significant coronary artery disease and an LVEF < 20%  Now with anoxic brain injury.  Pertinent  Medical History  Hypertension  Significant Hospital Events: Including procedures, antibiotic start and stop dates in addition to other pertinent events   1/19 admission, left heart cath without significant CAD.  Concern for anoxic brain injury 1/20: MRI pending, Neuro following 1/21: MRI shows areas of hypoxic-ischemic injury bilaterally. Fevered; cultures obtained. 1/23 severe brain damage 1/24 evidence of severe brain damage, remains critically ill 1/25: Plan for PEG today. Neuro exam unchanged.  Does exhibit new eyelid tremor like movements and lip/jaw tremor like vs clonus movement.  Bolused Keppra and increased maintenance dose.  Repeat EEG. CTH: concerning for progression of anoxic injury.  1/26: Repeat EEG unchanged and continues to show myoclonic seizures despite addition of Valproic acid yesterday. transfer to [facility] for continuous EEG 1/28: Continues to have myoclonic sz on EEG 1/29 No acute issues overnight 2/3 no meaningful neuro recovery. Diuresed 2/5 unchanged 2/6 New fever to 102, WBC 16K - treated with vancomycin for 7d based on cultures.  2/11: percutaneous tracheostomy 2/13 >2/20 on trach collar. No vent.  2/20 Trach change to 6 cuffless 2/27 status unchanged  Interim History / Subjective:  Has been doing well on trach collar. Neuro not able to be decannulated.    Objective   Blood pressure (!) 132/97, pulse 88, temperature 99.3 F (37.4 C), temperature source Oral, resp. rate (!) 24, height 5\' 10"  (1.778 m), weight 106.2 kg, SpO2 94 %.    FiO2 (%):  [28 %] 28 %   Intake/Output Summary (Last 24 hours) at 05/01/2021 1112 Last data filed at 05/01/2021 [id number] Gross per 24 hour  Intake 2792 ml  Output 1400 ml  Net 1392 ml     Filed Weights   Examination:  General: On trach collar in no distress.  HEENT: Tracheostomy site CDI. thick secretions.  Chest: Clear bilateral breath sounds CV: RRR, no MRG Abdo: soft, non-distended Extremities: no edema.  Neuro: Eyes closed, no pain response.   Ancillary tests personally reviewed:     Assessment & Plan:  OOH Vfib arrest Post arrest anoxic brain injury, severe Post arrest myoclonus improved with klonopin, no AEDs recommended Post arrest respiratory failure- vent dependent due to mental status and secretions Post arrest cardiomyopathy Post arrest AKI, improved/stable HTN- stable on current regimen HCAP w/ MSSA- ABX > 2/21  Plan:  - Continue trach collar - Continue klonipin, if breakthrough myoclonus okay to go up on this - Chest vest - HTS added 2/27 given thick secretions on exam - Appreciate SLP - Not candidate for decannulation due to neuro devastation   Best Practice (right click and "Reselect all SmartList Selections" daily)  Per trh   Lanier Clam, MD [facility] See Amion for contact info 05/01/2021 11:12 AM

## 2021-05-01 NOTE — Assessment & Plan Note (Addendum)
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

## 2021-05-01 NOTE — Progress Notes (Signed)
Spoke with patient's wife. States she will be coming by this evening after her shift.

## 2021-05-01 NOTE — Progress Notes (Signed)
MD informed of XRay results. Residual barely 5cc. Per MD, restart tube feeds at 64mL/hr.

## 2021-05-01 NOTE — TOC Progression Note (Signed)
Transition of Care [facility]) - Progression Note    Patient Details  Name: Billy Cherry MRN: [id number] Date of Birth: May 18, 1970  Transition of Care [facility]) CM/SW Contact  Leone Haven, RN Phone Number: 05/01/2021, 4:37 PM  Clinical Narrative:    NCM spoke with wife , to see how education was going, she states she was here on Friday and did some of the resp training, she will be here this evening to get some more training.  Per [name] with [employer] , the insurance company wants them to start services without an authorization to see if the services are medically necessary, but this is not how it works , they will need auth to start services, so he is working on this with upper levels to get this worked out.    Expected Discharge Plan: Long Term Acute Care (LTAC) Barriers to Discharge: Continued Medical Work up  Expected Discharge Plan and Services Expected Discharge Plan: Long Term Acute Care (LTAC) In-house Referral: Hospice / Palliative Care Discharge Planning Services: CM Consult Post Acute Care Choice: Long Term Acute Care (LTAC) Living arrangements for the past 2 months: Single Family Home                 DME Arranged: N/A                     Social Determinants of Health (SDOH) Interventions    Readmission Risk Interventions Readmission Risk Prevention Plan 04/14/2021  Transportation Screening Complete  HRI or Home Care Consult Complete  Social Work Consult for Recovery Care Planning/Counseling Complete  Palliative Care Screening Complete  Medication Review [employer]) Complete  Some recent data might be hidden

## 2021-05-01 NOTE — Progress Notes (Signed)
Progress Note   Patient: Billy Cherry [id number]:[id number] DOB: 06-Jan-1971 DOA: 03/30/2021     32 DOS: the patient was seen and examined on 05/01/2021   Brief hospital course: 51 y/o male with HTN who was found to be unresponsive by his wife. It tooke 5-10 min for EMS to arrive. EMS found him to be in V fib with agonal respirations. Defibrillated once and then went into asystole. Given 2 round of Epinephrineand had ROSC.  Futher history later obtained from wife that the patient had  angioedema however there is no report of this per multiple physician's notes from admission.  EKG after ROSC showed anterolateral ST elevation and he was taken to the cath lab. Dr Cassie Freer performed cath> insignificant CAD and EF 45-50%. Patient remained on ventilator after cath and was admitted to the ICU. Underwent TTM. Given Propofol.  1/19- neuro consulted- noted to have rhythmic twitching of his lips/tongue and was started on keppra. MRI brain> hypoxic ischemic injury involving bilateral ACA-MCA watershed cortex, bilat occipital cortex and BG. ECHO showed EF of 20% with global hypokinesis. Treated for VAP.   1/25> showed episodes of eye opening and lip twitching, more prominent when stimulated consistent with myoclonic seizures.  Additionally EEG was suggestive of severe diffuse encephalopathy.  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  1/27- Neuro, Dr Melynda Ripple had GOC conversation with wife who stated that she wanted to continue aggressive care  2/2 palliative care note> wife did not show up for meeting- wife was called  and noted that she "hopes to push out any decisions regarding her husband treatment  until his mother/[name] comes to [place].  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- placed on Vanc  for 7 day course  2/11 trach placed 2/16 recurrent fever 2/19 sputum culture grew pseudomonas see below for details. Received Cefepime.  - IR consulted for PEG tube after speaking with wife. 2/20 Change trach to 6 cuffless   Transition of care team, helping arrange home health.   Assessment and Plan: * Cardiac arrest ([name])- (present on admission) -Cardiac arrest with  resultant anoxic encephalopathy, respiratory failure, EF of < 20% -Has Tracheostomy and cor trak - S/P Peg tube placement.    Dysphagia Vomited today. KUB negative for obstruction. Resume tube feeding lower rate.   HTN (hypertension) - Severe uncontrolled HTN likely contributed to cardiac arrest - Cont Coreg, Isordil, amlodipine, Cardura, Hydralazine and PRN Labetalol  RLL pneumonia Sepsis- pseudomonas pneumonia - Completed 7 day of Vanc on 2/15 for MSSA and [name] -2/16>  having fevers as high at 101- lactic acid normal, RR in 30-40s, tachycardic, large amounts of yellow and bloody sputum -2/16 CXR > LLL opacity improved- RLL opacity worse and now has RLL effusion - SARS COVID neg - Sputum culture growing pseudomonas aeruginosa -Sensitive to Ceftaz- Completed antibiotics for Pseudomonas 2/21. -Afebrile.   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 Not following command -Eyes open, does not follows command.   Myoclonus- (present on admission) - Cont AEDs and benzodiazepines per Neuro- also per neuro notes, suppressing  his myoclonus will not ultimately change the prognosis On klonopin.   Acute respiratory failure ([name])- (present on admission) -Trach changed to size 6 cuffless on 2/20. -RT following.  -Trach 5 L Oxygen.  -Continue with suctioning.          Subjective:  Obtunded, per nurse he vomited tube feeding content.    Physical Exam: Vitals:   05/01/21 1135 05/01/21 1324 05/01/21 1457 05/01/21 1526  BP: (!) 139/95   (!) 144/102  Pulse: 86 90 97 89  Resp: 19 20 (!) 23 20  Temp: 99.5 F (37.5 C)   99.9 F (37.7 C)  TempSrc: Oral   Oral  SpO2: 90%   93%  Height:       General; NAD Lung; Trach in place, BL Ronchus.  Extremities; no edema.   Data Reviewed:  KUB reviewed.   Family Communication: will call wife.   Disposition: Status is: Inpatient Remains inpatient appropriate because: awaiting safe discharge plan.           Planned Discharge Destination: Home     Time spent: 45 minutes  Author: Alba Cory, MD 05/01/2021 5:34 PM  For on call review [email or web address].

## 2021-05-01 NOTE — Progress Notes (Signed)
°   05/01/21 1135  Assess: MEWS Score  Temp 99.5 F (37.5 C)  BP (!) 139/95  Pulse Rate 86  ECG Heart Rate 86  Resp 19  SpO2 90 %  O2 Device Tracheostomy Collar  O2 Flow Rate (L/min) 5 L/min  FiO2 (%) 28 %  Assess: MEWS Score  MEWS Temp 0  MEWS Systolic 0  MEWS Pulse 0  MEWS RR 0  MEWS LOC 3  MEWS Score 3  MEWS Score Color Yellow  Assess: if the MEWS score is Yellow or Red  Were vital signs taken at a resting state? Yes  Focused Assessment No change from prior assessment  Early Detection of Sepsis Score *See Row Information* Low  MEWS guidelines implemented *See Row Information* No, previously red, continue vital signs every 4 hours  Treat  MEWS Interventions Administered scheduled meds/treatments  Notify: Charge Nurse/RN  Name of Charge Nurse/RN Notified [place], RN  Date Charge Nurse/RN Notified 05/01/21  Time Charge Nurse/RN Notified 1250  Document  Progress note created (see row info) Yes

## 2021-05-01 NOTE — Progress Notes (Signed)
Emesis x1. Tube feeds held. RN requested assistance for cleaning patient up. MD at bedside. She was informed tube feeds came from mouth. ABD soft. Per NA, patient had large soft BM this morning. MD informed patient had two BM yesterday. XR ABD ordered and completed- awaiting results and MD order before resuming feeds.   Patient was suction through trach x1. Tolerated well. Expelled moderate, thick tan secretions. No evidence of aspiration. RT at bedside for CPT. Informed of situation above. CPT delayed at this time.

## 2021-05-02 ENCOUNTER — Inpatient Hospital Stay (HOSPITAL_COMMUNITY): Payer: BC Managed Care – PPO

## 2021-05-02 LAB — CBC
HCT: 37.8 % — ABNORMAL LOW (ref 39.0–52.0)
Hemoglobin: 12.5 g/dL — ABNORMAL LOW (ref 13.0–17.0)
MCH: 31 pg (ref 26.0–34.0)
MCHC: 33.1 g/dL (ref 30.0–36.0)
MCV: 93.8 fL (ref 80.0–100.0)
Platelets: 196 10*3/uL (ref 150–400)
RBC: 4.03 MIL/uL — ABNORMAL LOW (ref 4.22–5.81)
RDW: 14.6 % (ref 11.5–15.5)
WBC: 13.8 10*3/uL — ABNORMAL HIGH (ref 4.0–10.5)
nRBC: 0 % (ref 0.0–0.2)

## 2021-05-02 LAB — BASIC METABOLIC PANEL
Anion gap: 8 (ref 5–15)
BUN: 27 mg/dL — ABNORMAL HIGH (ref 6–20)
CO2: 30 mmol/L (ref 22–32)
Calcium: 8.5 mg/dL — ABNORMAL LOW (ref 8.9–10.3)
Chloride: 101 mmol/L (ref 98–111)
Creatinine, Ser: 0.82 mg/dL (ref 0.61–1.24)
GFR, Estimated: >60 mL/min (ref >60)
Glucose, Bld: 113 mg/dL — ABNORMAL HIGH (ref 70–99)
Potassium: 4.1 mmol/L (ref 3.5–5.1)
Sodium: 139 mmol/L (ref 135–145)

## 2021-05-02 LAB — LACTIC ACID, PLASMA
Lactic Acid, Venous: 1.6 mmol/L (ref 0.5–1.9)
Lactic Acid, Venous: 1.5 mmol/L (ref 0.5–1.9)

## 2021-05-02 MED ORDER — SODIUM CHLORIDE 0.9 % IV SOLN
2.0000 g | Freq: Three times a day (TID) | INTRAVENOUS | Status: DC
  Administered 2021-05-02 – 2021-05-10 (×25): 2 g via INTRAVENOUS
  Filled 2021-05-02 (×25): qty 2

## 2021-05-02 MED ORDER — JEVITY 1.5 CAL/FIBER PO LIQD
355.0000 mL | Freq: Three times a day (TID) | ORAL | Status: DC
  Administered 2021-05-02 – 2021-05-16 (×41): 355 mL
  Filled 2021-05-02 (×13): qty 474
  Filled 2021-05-02: qty 1000
  Filled 2021-05-02 (×14): qty 474
  Filled 2021-05-02: qty 1000
  Filled 2021-05-02 (×3): qty 474
  Filled 2021-05-02: qty 1000
  Filled 2021-05-02 (×8): qty 474
  Filled 2021-05-02: qty 1000
  Filled 2021-05-02 (×2): qty 474

## 2021-05-02 MED ORDER — SODIUM CHLORIDE 0.9 % IV BOLUS
500.0000 mL | Freq: Once | INTRAVENOUS | Status: AC
Start: 2021-05-02 — End: 2021-05-02
  Administered 2021-05-02: 500 mL via INTRAVENOUS

## 2021-05-02 NOTE — Progress Notes (Signed)
Progress Note   Patient: Billy Cherry [id number] DOB: [birth year] DOA: 03/30/2021     33 DOS: the patient was seen and examined on 05/02/2021   Brief hospital course: 51 y/o male with HTN who was found to be unresponsive by his wife. It tooke 5-10 min for EMS to arrive. EMS found him to be in V fib with agonal respirations. Defibrillated once and then went into asystole. Given 2 round of Epinephrineand had ROSC.  Futher history later obtained from wife that the patient had  angioedema however there is no report of this per multiple physician's notes from admission.  EKG after ROSC showed anterolateral ST elevation and he was taken to the cath lab. Dr Josefa Half performed cath> insignificant CAD and EF 45-50%. Patient remained on ventilator after cath and was admitted to the ICU. Underwent TTM. Given Propofol.  1/19- neuro consulted- noted to have rhythmic twitching of his lips/tongue and was started on keppra. MRI brain> hypoxic ischemic injury involving bilateral ACA-MCA watershed cortex, bilat occipital cortex and BG. ECHO showed EF of 20% with global hypokinesis. Treated for VAP.   1/25> showed episodes of eye opening and lip twitching, more prominent when stimulated consistent with myoclonic seizures.  Additionally EEG was suggestive of severe diffuse encephalopathy.  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  1/27- Neuro, Dr Hortense Ramal had [place] conversation with wife who stated that she wanted to continue aggressive care  2/2 palliative care note> wife did not show up for meeting- wife was called  and noted that she "hopes to push out any decisions regarding her husband treatment  until his mother/[name] comes to [place].  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- placed on Vanc  for 7 day course  2/11 trach placed 2/16 recurrent fever 2/19 sputum culture grew pseudomonas see below for details. Received Cefepime.  - IR consulted for PEG tube after speaking with wife. 2/20 Change trach to 6 cuffless   Transition of care team, helping arrange home health.  Spike Fever 2/28, probably aspirate 2/27  Assessment and Plan: * Cardiac arrest [facility])- (present on admission) -Cardiac arrest with  resultant anoxic encephalopathy, respiratory failure, EF of < 20% -Has Tracheostomy and cor trak - S/P Peg tube placement.    Dysphagia Vomited 2/27. KUB negative for obstruction. Resume tube feeding lower rate.   HTN (hypertension) - Severe uncontrolled HTN likely contributed to cardiac arrest - Cont Coreg, Isordil, amlodipine, Cardura, Hydralazine and PRN Labetalol  RLL pneumonia Sepsis- pseudomonas pneumonia - Completed 7 day of Vanc on 2/15 for MSSA and [name] -2/16>  having fevers as high at 101- lactic acid normal, RR in 30-40s, tachycardic, large amounts of yellow and bloody sputum -2/16 CXR > LLL opacity improved- RLL opacity worse and now has RLL effusion. - Sputum culture growing pseudomonas aeruginosa -Sensitive to Ceftaz- Completed antibiotics for Pseudomonas 2/21. -Spike fever 2/28: chest xray: Bibasilar atelectasis versus consolidation. Started cefepime, respiratory culture ordered. Check lactic acid. WBC increased.      HFrEF (heart failure with reduced ejection fraction) ([name]) - Cont coreg, imdur, hydralazine - fluid management with tube feeds  Hypernatremia - Resolved, follow intermittently - Cont free water 400 cc Q 6 hrs for now  Anoxic encephalopathy ([place])- (present on admission) -Obtunded. Not following command -not  following command.   Myoclonus- (present on admission) - Cont AEDs and benzodiazepines per Neuro- also per neuro notes, suppressing his myoclonus will not ultimately change the prognosis On klonopin.   Acute  respiratory failure ([place])- (present on admission) -Trach changed to size 6 cuffless on 2/20. -RT following.  -Trach 5 L Oxygen.  -Continue with suctioning.  -spike fever, sat stable, cover for PNA>         Subjective: He is no responsive.   Physical Exam: Vitals:   05/02/21 0802 05/02/21 1000 05/02/21 1252 05/02/21 1608  BP: (!) 164/105 (!) 157/105 (!) 159/112   Pulse: 89 87 88 94  Resp: (!) 21 (!) 21 (!) 21 20  Temp: (!) 100.4 F (38 C) 99.4 F (37.4 C) 100.3 F (37.9 C)   TempSrc: Axillary Axillary Axillary   SpO2: 98% 99% 99% 100%  Height:       General; Obtunded.  Lung; Trach in place, BL ronchus.  Abdomen; soft, peg tube in place.  Neuro; obtunded.   Data Reviewed:  Cbc, bmet, chest x ray  Family Communication: Wife over phone 2/27  Disposition: Status is: Inpatient Remains inpatient appropriate because: now spike fever, treating for PNA          Planned Discharge Destination: Home     Time spent: 45 minutes  Author: Elmarie Shiley, MD 05/02/2021 4:45 PM  For on call review [email or web address].

## 2021-05-02 NOTE — Progress Notes (Signed)
°   05/02/21 0802  Assess: MEWS Score  Temp (!) 100.4 F (38 C)  BP (!) 164/105  Pulse Rate 89  ECG Heart Rate 88  Resp (!) 21  SpO2 98 %  O2 Device Tracheostomy Collar  O2 Flow Rate (L/min) 5 L/min  FiO2 (%) 28 %  Assess: MEWS Score  MEWS Temp 0  MEWS Systolic 0  MEWS Pulse 0  MEWS RR 1  MEWS LOC 3  MEWS Score 4  MEWS Score Color Red  Assess: if the MEWS score is Yellow or Red  Were vital signs taken at a resting state? Yes  Focused Assessment No change from prior assessment  Early Detection of Sepsis Score *See Row Information* High  MEWS guidelines implemented *See Row Information* No, previously red, continue vital signs every 4 hours  Treat  MEWS Interventions Administered prn meds/treatments (administer prn tylenol, MD ordered chest XR)  Notify: Charge Nurse/RN  Name of Charge Nurse/RN Notified [place]  Date Charge Nurse/RN Notified 05/02/21  Time Charge Nurse/RN Notified [phone]  Notify: Provider  Provider Name/Title [place]  Date Provider Notified 05/02/21  Time Provider Notified [phone]  Notification Type Page  Notification Reason Other (Comment) (MEWS score for early detection of Sepsis)  Provider response  (awaiting response)  Document  Progress note created (see row info) Yes

## 2021-05-02 NOTE — Progress Notes (Signed)
Trach care education provided to patients wife. Wife changed inner cannula. Questions/concerns answered at this time.

## 2021-05-02 NOTE — Progress Notes (Signed)
Nutrition Follow-up  DOCUMENTATION CODES:   Not applicable  INTERVENTION:   Transition to bolus Tube Feeds via PEG: 355 mL Jevity 1.5 - TID (1 1/2 cartons/bolus) 45 mL ProSource TF - BID Continue 400 mL free water q6h per MD Provides 2213 kcal, 113 gm PRO, and 1080 mL free water (2680 mL total free water) daily.   NUTRITION DIAGNOSIS:   Inadequate oral intake related to inability to eat as evidenced by NPO status. - Ongoing, being addressed via TF  GOAL:   Patient will meet greater than or equal to 90% of their needs - Being addressed via TF  MONITOR:   TF tolerance, Weight trends, Labs, Skin  REASON FOR ASSESSMENT:   Ventilator, Consult Enteral/tube feeding initiation and management  ASSESSMENT:   51 year old male who originally presented to [facility] on 1/19 after out-of-hospital cardiac arrest due to suspected angioedema from ACE inhibitor leading to asphyxiation. PMH of HTN, HLD.  2/11 - tracheostomy placed 2/22 - PEG placed   Per notes, pt had and episode of emesis yesterday. TF was held and later restarted at half rate of 30 mL/hr.  Pt has not received a weight since 2/20. Per notes, bed scale is broken.   Spoke with RN in room. Reports that pt has not had another episode of emesis since yesterday, stools are good. Reports that pt did start having a fever last night and is getting started on antibiotics.  Discussed with RN that the plan is to transfer to bolus feeds and prepare to be discharged.   Medications reviewed and include: Fiber, Protonix, IV antibiotics  Labs reviewed: BUN 27  Diet Order:   Diet Order     None       EDUCATION NEEDS:   Not appropriate for education at this time  Skin:  Skin Assessment: Skin Integrity Issues: Skin Integrity Issues:: Other (Comment) Other: MASD buttocks  Last BM:  2/26 - Type 6  Height:   Ht Readings from Last 1 Encounters:  04/18/21 5\' 10"  (1.778 m)    Weight:   Wt Readings from Last 1 Encounters:   03/30/21 101.8 kg    Ideal Body Weight:  75.5 kg  BMI:  Body mass index is 33.59 kg/m.  Estimated Nutritional Needs:   Kcal:  2200-2400  Protein:  110-125 grams  Fluid:  >/= 2.2 L   04/01/21 RD, LDN Clinical Dietitian See [facility] for contact information.

## 2021-05-02 NOTE — Progress Notes (Signed)
SLP Cancellation Note  Patient Details Name: Sulaiman Fintel MRN: [id number] DOB: 01/31/1971   Cancelled treatment:       Reason Eval/Treat Not Completed: Other (comment) Checked in on pt this am and there was not family present. Will continue efforts to educate pt's family on PMV use.     Osie Bond., M.A. [facility] Pager [phone] Office [phone]  05/02/2021, 9:48 AM

## 2021-05-02 NOTE — Progress Notes (Signed)
Pharmacy Antibiotic Note  Billy Cherry is a 51 y.o. male admitted on 03/30/2021 with  PSA HCAP .  Pharmacy has been consulted for Ceftaz dosing.  ID: s/p abx for PNA x2 courses >> back on for Pseu PNA. Tmax 100.7, WBC 13.8, Scr <1  CTX 1/21 >> 1/25 Cefepime 1/31 >> 2/2; 2/8>2/9; 2/16 >> 2/19 Ancef 2/2 >> 2/6 Vanc 2/9 > 2/15 Fortaz 2/19>2/21, 2/28>>  1/31 TA - MSSA and Corynebacterium  2/6 TA - MSSA and flavobacterium  2/6 BCx - negative 2/16 BCx - neg 2/16 TA - abundant Pseu (pan sensitive)  Plan: Ceftaz 2g IV q8hr Pharmacy will sign off. Please reconsult for further dosing assitance.    Height:  (bed scale not working) Weight:  (bed scale not working Charity fundraiser aware) IBW/kg (Calculated) : 73  Temp (24hrs), Avg:100.2 F (37.9 C), Min:99.5 F (37.5 C), Max:100.7 F (38.2 C)  Recent Labs  Lab 04/27/21 1203 04/28/21 0522 04/30/21 1218 05/02/21 0806  WBC 14.1* 11.5* 11.5* 13.8*  CREATININE 0.91  --  0.84  --     Estimated Creatinine Clearance: 128.4 mL/min (by C-G formula based on SCr of 0.84 mg/dL).    Allergies  Allergen Reactions   Lisinopril Swelling    angioedema     Jameria Bradway S. Merilynn Finland, PharmD, BCPS Clinical Staff Pharmacist [email or web address] Pasty Spillers 05/02/2021 8:47 AM

## 2021-05-02 NOTE — Progress Notes (Signed)
Heart Failure Navigator Progress Note  Assessed for Heart & Vascular TOC clinic readiness.  Patient does not meet criteria due to poor cognitive function s/p cardiac arrest. Pt currently being worked up for increased temperature and blood pressure. Family working with staff for trach and PEG education.  EF <20%   Navigator available for educational resources.   Ozella Rocks, MSN, RN Heart Failure Nurse Navigator [phone]

## 2021-05-03 LAB — CBC
HCT: 38.9 % — ABNORMAL LOW (ref 39.0–52.0)
Hemoglobin: 12.9 g/dL — ABNORMAL LOW (ref 13.0–17.0)
MCH: 31.1 pg (ref 26.0–34.0)
MCHC: 33.2 g/dL (ref 30.0–36.0)
MCV: 93.7 fL (ref 80.0–100.0)
Platelets: 194 10*3/uL (ref 150–400)
RBC: 4.15 MIL/uL — ABNORMAL LOW (ref 4.22–5.81)
RDW: 14.4 % (ref 11.5–15.5)
WBC: 10.6 10*3/uL — ABNORMAL HIGH (ref 4.0–10.5)
nRBC: 0 % (ref 0.0–0.2)

## 2021-05-03 MED ORDER — HYDRALAZINE HCL 50 MG PO TABS
75.0000 mg | ORAL_TABLET | Freq: Three times a day (TID) | ORAL | Status: DC
  Administered 2021-05-03 – 2021-05-07 (×14): 75 mg
  Filled 2021-05-03 (×14): qty 1

## 2021-05-03 MED ORDER — HYDRALAZINE HCL 25 MG PO TABS
25.0000 mg | ORAL_TABLET | Freq: Once | ORAL | Status: AC
  Administered 2021-05-03: 25 mg via ORAL
  Filled 2021-05-03: qty 1

## 2021-05-03 NOTE — TOC Progression Note (Addendum)
Transition of Care (TOC) - Progression Note  ? ? ?Patient Details  ?Name: Billy Cherry ?MRN: [id number] ?Date of Birth: February 22, 1971 ? ?Transition of Care (TOC) CM/SW Contact  ?Zenon Mayo, RN ?Phone Number: ?05/03/2021, 4:02 PM ? ?Clinical Narrative:    ?Per [name] with [facility], they are going to start the adult nursing when patient is ready, they have touched based with the wife. They will provide 24 hrs per week, which consist of 4 to 6 hr shifts, on Mon , Tues, Wed and FRiday.  The wife will be off on Thursday's so she will be doing the care then.  Also the wife states she may have another person (family) member to help who will need to be trained also.  TOC will continue to follow , wife will need to continue to get education ,she has been trying to come in the evening.  Enteral tube feeding order was signed by Dr. Tyrell Antonio yesterday, and it was faxed to [facility] today, but he has been switched to bolus feeds. Thedore Mins will need to do order for trach kit supplies.  ? ? ?Expected Discharge Plan: [place] (LTAC) ?Barriers to Discharge: Continued Medical Work up ? ?Expected Discharge Plan and Services ?Expected Discharge Plan: [place] (LTAC) ?In-house Referral: Hospice / Palliative Care ?Discharge Planning Services: CM Consult ?Post Acute Care Choice: Long Term Acute Care (LTAC) ?Living arrangements for the past 2 months: [place] ?                ?DME Arranged: N/A ?  ?  ?  ?  ?  ?  ?  ?  ?  ? ? ?Social Determinants of Health (SDOH) Interventions ?  ? ?Readmission Risk Interventions ?Readmission Risk Prevention Plan 04/14/2021  ?Transportation Screening Complete  ?[place] or Home Care Consult Complete  ?Social Work Consult for [place] Planning/Counseling Complete  ?Palliative Care Screening Complete  ?Medication Review Press photographer) Complete  ?Some recent data might be hidden  ? ? ?

## 2021-05-03 NOTE — Progress Notes (Signed)
?Progress Note ? ? ?Patient: Billy Cherry [id number] DOB: [birth year] DOA: 03/30/2021     34 ?DOS: the patient was seen and examined on 05/03/2021 ?  ?Brief hospital course: ?51 y/o male with HTN who was found to be unresponsive by his wife. It tooke 5-10 min for EMS to arrive. EMS found him to be in V fib with agonal respirations. Defibrillated once and then went into asystole. Given 2 round of Epinephrineand had ROSC.  ?Futher history later obtained from wife that the patient had  angioedema however there is no report of this per multiple physician's notes from admission. ? ?EKG after ROSC showed anterolateral ST elevation and he was taken to the cath lab. Dr Josefa Half performed cath> insignificant CAD and EF 45-50%. Patient remained on ventilator after cath and was admitted to the ICU. Underwent TTM. Given Propofol.  ?1/19- neuro consulted- noted to have rhythmic twitching of his lips/tongue and was started on keppra. 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 ?ECHO showed EF of 20% with global hypokinesis. ?Treated for VAP.  ? ?1/25> showed episodes of eye opening and lip twitching, more prominent when stimulated consistent with myoclonic seizures.  Additionally EEG was suggestive of severe diffuse encephalopathy.  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 ? ?1/27- Neuro, Dr Hortense Ramal had [place] conversation with wife who stated that she wanted to continue aggressive care ? ?2/2 palliative care note> wife did not show up for meeting- wife was called  and noted that she "hopes to push out any decisions regarding her husband treatment  until his mother/[name] comes to [place].  Unfortunately she will not be here until February 17th" ? ?2/6 new fever ? ?2/10> PCCM and wife did consent for trach and decided to pursue PEG as well ?- resp culture revealed MSSA and Flavobacterium Odoratum- placed on Vanc  for 7 day course ? ?2/11 trach placed ?2/16 recurrent fever ?2/19 sputum culture grew pseudomonas see below for details. Received Cefepime.  ?- IR consulted for PEG tube after speaking with wife. ?2/20 Change trach to 6 cuffless  ? ?Transition of care team, helping arrange home health.  ?Spike Fever 2/28, probably aspirate 2/27. Treating for PNA, follow respiratory culture.  ? ?Assessment and Plan: ?* Cardiac arrest ([place])- (present on admission) ?-Cardiac arrest with  resultant anoxic encephalopathy, respiratory failure, EF of < 20% ?-Has Tracheostomy and cor trak ?- S/P Peg tube placement.  ? ? ?Dysphagia ?Vomited 2/27. KUB negative for obstruction. Resume tube feeding. Nurses Monitoring for residual.  ? ?HTN (hypertension) ?- Severe uncontrolled HTN likely contributed to cardiac arrest ?- Cont Coreg, Isordil, amlodipine, Cardura, Hydralazine and PRN Labetalol ?BP increasing, Increased Hydralazine today 3/01 ? ?RLL pneumonia ?Sepsis- pseudomonas pneumonia ?- Completed 7 day of Vanc on 2/15 for MSSA and [name] ?-2/16>  having fevers as high at 101- lactic acid normal, RR in 30-40s, tachycardic, large amounts of yellow and bloody sputum ?-2/16 CXR > LLL opacity improved- RLL opacity worse and now has RLL effusion. ?- Sputum culture grew  pseudomonas aeruginosa -Completed antibiotics for Pseudomonas 2/21. ?-Spike fever 2/28: chest xray: Bibasilar atelectasis versus consolidation. Started ceftazidime  respiratory culture pending. WBC trending down. Day 2 antibiotics.  ?   ? ?HFrEF (heart failure with reduced ejection fraction) ([place]) ?- Cont coreg, imdur, hydralazine ?- fluid management with tube feeds ?Increase Hydralazine 3/01 BP elevated.  ? ?Hypernatremia ?- Resolved, follow intermittently ?- Cont  free water 400 cc Q 6 hrs for now ? 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 Not following command ? ? 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  ? ?Acute respiratory failure ([place])- (present on admission) ?-Trach changed to size 6 cuffless on 2/20. ?-RT following.  ?-Trach 5 L Oxygen.  ?-Continue with suctioning.  ?-Spike fever 2/28, sat stable, cover for PNA> Follow Respiratory culture.  ? ? ? ? ? ? ? ?Subjective: non responsive.  ? ?Physical Exam: ?Vitals:  ? 05/03/21 0752 05/03/21 0935 05/03/21 1100 05/03/21 1124  ?BP:  (!) 168/119 (!) 146/99   ?Pulse: 88  89 88  ?Resp: (!) 28  (!) 26 (!) 24  ?Temp:      ?TempSrc:      ?SpO2: 100%  100% 100%  ?Height:      ? ?General; Obtunded.  ?Lung; trach in place. BL ronchus ?Abdomen: Peg tube in place.  ? ?Data Reviewed: ? ?CBC ? ?Family Communication: Wife over phone 3/01 ? ?Disposition: ?Status is: Inpatient ?Remains inpatient appropriate because: Treating for PNA ? ? ? ? ? ? ? ? ? Planned Discharge Destination: Home ? ? ? ? ?Time spent: 45 minutes ? ?Author: ?Elmarie Shiley, MD ?05/03/2021 2:32 PM ? ?For on call review [email or web address].  ? ?

## 2021-05-03 NOTE — Progress Notes (Signed)
Pt was drowsy all day long. BP was elevated to 170-180 since 6:00 am in the morning. Hydralazine 75 mg was ordered. After morning BP meds, pt's BP went back to  140's-150's ?

## 2021-05-04 DIAGNOSIS — E669 Obesity, unspecified: Secondary | ICD-10-CM

## 2021-05-04 MED ORDER — ORAL CARE MOUTH RINSE
15.0000 mL | Freq: Two times a day (BID) | OROMUCOSAL | Status: DC
  Administered 2021-05-04 – 2021-08-28 (×230): 15 mL via OROMUCOSAL

## 2021-05-04 NOTE — Progress Notes (Signed)
SLP Cancellation Note ? ?Patient Details ?Name: Billy Cherry ?MRN: [id number] ?DOB: 17-Jan-1971 ? ? ?Cancelled treatment:       Reason Eval/Treat Not Completed: Other (comment) Per chart, pt's wife had planned to be present this afternoon for education. SLP checking in pt's room several times this afternoon and each time there was no family present. Will continue efforts.  ? ? ? ?Mahala Menghini., M.A. CCC-SLP ?Acute Rehabilitation Services ?Pager [phone] ?Office [phone] ? ?05/04/2021, 2:28 PM ?

## 2021-05-04 NOTE — Assessment & Plan Note (Signed)
BMI 33.

## 2021-05-04 NOTE — Progress Notes (Signed)
Occupational Therapy Treatment ?Patient Details ?Name: Billy Cherry ?MRN: [id number] ?DOB: Sep 30, 1970 ?Today's Date: 05/04/2021 ? ? ?History of present illness 51 y.o. male who presented 03/23/21 as a code STEMI after being found unresponsive. V. fib cardiac arrest in setting of respiratory arrest due to suspected angioedema from ACE inhibitor leading to asphyxiation.  Concern on his 12 lead EKG for anterolateral ST elevation so he was taken to the cath lab where his study showed no significant coronary artery disease and an LVEF < 20%. 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 S/p cortak placement 1/27, trach placement 2/11. ETT 1/19 - 2/11. PMH: HTN ?  ?OT comments ? Spoke with wife earlier today who stated she would be here @ 3-4 pm. Completed session to familiarize self with pt and establish HEP. Pt with spontaneous eye opening but no blink to threat, no command follow and no withdrawal from noxious stimuli. Written HEP established however wife called and unable to attend education session today due to family emergency. ?Will follow up with wife later time to set a date/time for education.  ? ?Recommendations for follow up therapy are one component of a multi-disciplinary discharge planning process, led by the attending physician.  Recommendations may be updated based on patient status, additional functional criteria and insurance authorization. ?   ?Follow Up Recommendations ? OT at Long-term acute care hospital  ?  ?Assistance Recommended at Discharge Frequent or constant Supervision/Assistance  ?Patient can return home with the following ? Other (comment) (total Care) ?  ?Equipment Recommendations ? Hospital bed;Other (comment) (air mattress overlay) ; hoyer; high back reclining tilt in space wc with head and trunk supports ?  ?Recommendations for Other Services   ? ?  ?Precautions / Restrictions  Precautions ?Precautions: Fall;Other (comment) ?Precaution Comments: trach; cortrak  ? ? ?  ? ?Mobility Bed Mobility ?  ?  ?  ?  ?  ?  ?  ?  ?  ? ?Transfers ?  ?  ?  ?  ?  ?  ?  ?  ?  ?  ?  ?  ?Balance   ?  ?  ?  ?  ?  ?  ?  ?  ?  ?  ?  ?  ?  ?  ?  ?  ?  ?  ?   ? ?ADL either performed or assessed with clinical judgement  ? ?ADL   ?  ?  ?  ?  ?  ?  ?  ?  ?  ?  ?  ?  ?  ?  ?  ?  ?  ?  ?  ?General ADL Comments: total Care ?  ? ?Extremity/Trunk Assessment Upper Extremity Assessment ?Upper Extremity Assessment:  (flaccid) ?LUE Deficits / Details: flaccid ?  ?  ?  ?  ?  ? ?Vision   ?Additional Comments: no blink to threat ?  ?Perception   ?  ?Praxis   ?  ? ?Cognition Arousal/Alertness: Lethargic ?Behavior During Therapy: Flat affect ?  ?  ?  ?  ?  ?  ?  ?  ?  ?  ?  ?  ?  ?  ?  ?  ?  ?General Comments: no command following; opening eyes spontaneously; not tracking ?  ?  ?   ?Exercises General Exercises - Upper Extremity ?Shoulder Flexion: PROM, Both, 10 reps, Supine ?Shoulder ABduction: PROM, Both,  10 reps, Supine ?Elbow Flexion: PROM, Both, 10 reps, Supine ?Elbow Extension: PROM, Both, 10 reps, Supine ?Wrist Flexion: PROM, Both, 10 reps ?Wrist Extension: PROM, Both, 10 reps, Supine ?Digit Composite Flexion: PROM, Both, 10 reps, Supine ?Composite Extension: PROM, Both, 10 reps, Supine ? ?  ?Shoulder Instructions   ? ? ?  ?General Comments    ? ? ?Pertinent Vitals/ Pain       Pain Assessment ?Pain Assessment: Faces ?Faces Pain Scale: No hurt ? ?Home Living   ?  ?  ?  ?  ?  ?  ?  ?  ?  ?  ?  ?  ?  ?  ?  ?  ?  ?  ? ?  ?Prior Functioning/Environment    ?  ?  ?  ?   ? ?Frequency ? Min 1X/week  ? ? ? ? ?  ?Progress Toward Goals ? ?OT Goals(current goals can now be found in the care plan section) ? Progress towards OT goals: Progressing toward goals ? ?Acute Rehab OT Goals ?Patient Stated Goal: pt unable to state ?OT Goal Formulation: Patient unable to participate in goal setting ?Time For Goal Achievement: 05/11/21 ?Potential to  Achieve Goals: Fair ?ADL Goals ?Pt Will Perform Grooming:  (DC goal) ?Additional ADL Goal #1: Pt will be independent with PROM adn positioning to reduce riskof contracture and improve skin integrity ?Additional ADL Goal #2:  (DC goal 3/2)  ?Plan Discharge plan remains appropriate;Frequency needs to be updated   ? ?Co-evaluation ? ? ?   ?  ?  ?  ?  ? ?  ?AM-PAC OT "6 Clicks" Daily Activity     ?Outcome Measure ? ? Help from another person eating meals?: Total ?Help from another person taking care of personal grooming?: Total ?Help from another person toileting, which includes using toliet, bedpan, or urinal?: Total ?Help from another person bathing (including washing, rinsing, drying)?: Total ?Help from another person to put on and taking off regular upper body clothing?: Total ?Help from another person to put on and taking off regular lower body clothing?: Total ?6 Click Score: 6 ? ?  ?End of Session   ? ?OT Visit Diagnosis: Unsteadiness on feet (R26.81);Other abnormalities of gait and mobility (R26.89);Muscle weakness (generalized) (M62.81) ?  ?Activity Tolerance Other (comment) (limited by poor level of arousal) ?  ?Patient Left in bed;with call bell/phone within reach ?  ?Nurse Communication Other (comment) (to call when wife arrives) ?  ? ?   ? ?Time: 0630-1601 ?OT Time Calculation (min): 16 min ? ?Charges: OT General Charges ?$OT Visit: 1 Visit ?OT Treatments ?$Therapeutic Exercise: 8-22 mins ? ?[facility], OT/L  ? ?Acute OT Clinical Specialist ?Acute Rehabilitation Services ?Pager [phone] ?Office [phone]  ? ?Corbin Falck,HILLARY ?05/04/2021, 3:04 PM ?

## 2021-05-04 NOTE — Progress Notes (Signed)
PROGRESS NOTE Billy Cherry  HWE:[id number] DOB: 1970-10-17 DOA: 03/30/2021 PCP: Patient, No Pcp Per (Inactive)   Brief Narrative/Hospital Course: 51 y/o male with HTN who was found to be unresponsive by his wife. It tooke 5-10 min for EMS to arrive. EMS found him to be in V fib with agonal respirations. Defibrillated once and then went into asystole. Given 2 round of Epinephrineand had ROSC.  Futher history later obtained from wife that the patient had  angioedema however there is no report of this per multiple physician's notes from admission.  EKG after ROSC showed anterolateral ST elevation and he was taken to the cath lab. Dr Josefa Half performed cath> insignificant CAD and EF 45-50%. Patient remained on ventilator after cath and was admitted to the ICU. Underwent TTM. Given Propofol.  1/19- neuro consulted- noted to have rhythmic twitching of his lips/tongue and was started on keppra. MRI brain> hypoxic ischemic injury involving bilateral ACA-MCA watershed cortex, bilat occipital cortex and BG. ECHO showed EF of 20% with global hypokinesis. Treated for VAP.   1/25> showed episodes of eye opening and lip twitching, more prominent when stimulated consistent with myoclonic seizures.  Additionally EEG was suggestive of severe diffuse encephalopathy.  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  1/27- Neuro, Dr Hortense Ramal had [place] conversation with wife who stated that she wanted to continue aggressive care  2/2 palliative care note> wife did not show up for meeting- wife was called  and noted that she "hopes to push out any decisions regarding her husband treatment  until his mother/[name] comes to [place].  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 Received Cefepime.  - IR consulted for PEG tube after speaking with wife. 2/20 Change trach to 6 cuffless   Transition of care team, helping arrange home health.  Spike Fever 2/28, probably aspirate 2/27. Treating for PNA, follow respiratory culture.     Subjective: Seen and examined this morning.  On trach collar briefly opens eyes to voice no other response nonverbal non-communicative PEG tube in place getting bolus feeding. Was febrile earlier this morning at 101  Assessment and Plan: * Cardiac arrest [facility]) Cardiac arrest with resulting anoxic encephalopathy Respiratory failure s/p [place]- trach collar dependent S/P PEG tube placement: his EF is less than 20%, now on tracheostomy with trach collar and S/P PEG tube getting bolus tube feeding.remains obtunded with anoxic encephalopathy, remains on Keppra.Continue frequent positioning supportive care. Monitor closely at risk of decompensation. At this time plan is for adult nursing will provide 24 hours/week care, wife off on Thursday when she will be doing the care, wife may have more family member to help her with the care, enteral tube feeding trach kit and supplies have been arranged by TOC.  Anoxic encephalopathy ([place]) See above   RLL pneumonia Sepsis secondary to pneumonia.   -had completed 7 day of Vanc on 2/15 for MSSA and [name] -2/16>having fevers as high at 101- lactic acid normal, RR in 30-40s, tachycardic, large amounts of yellow and bloody sputum -2/16 CXR > LLL opacity improved- RLL opacity worse and now has RLL effusion. - Sputum culture grew  pseudomonas aeruginosa -Completed antibiotics for Pseudomonas on 2/21. -Spike fever 2/28: chest xray showed- Bibasilar atelectasis versus consolidation. Started on ceftazidime- day # 3-cont same. Respiratory culture-pending Gram stain with few gram-negative rods  and moderate gram-positive cocci.WBC trending down, but he still having temperature spike this morning.monitor.  Acute respiratory failure ([place]) See above not dependent Trach changed to size 6 cuffless on 2/20, RT following, ON 4L [place], FIO2 28%.Continue with suctioning.   Dysphagia PEG tube in place.  Vomited 2/27-KUB negative for obstruction.  Tolerating tube feeds at this time.   Obesity, Class I, BMI 30-34.9 BMI 33.  HTN (hypertension) Blood pressures poorly controlled-likely contributed to patient's cardiac arrest.  Started on Coreg Isordil amlodipine Cardura hydralazine and PRN Labetalol. Hydralazine dose was decreased 3/1 currently stable.  Continue current regimen   HFrEF (heart failure with reduced ejection fraction) ([name]) EF less than 20%, volume status stable, continue coreg, imdur, hydralazine.  Fluid management closely.Increased Hydralazine 3/01 for elevated BP Net IO Since Admission: 9,559.5 mL [05/04/21 1331]  Last Weight  Most recent update: 04/24/2021 12:18 AM    Weight  106.2 kg (234 lb 2.1 oz)             Hypernatremia Resolved with adjustment of free water. on free water 400 cc Q 6 hrs for now  Myoclonus Seen by neurology.  Continue current AED with Klonopin, Keppra. per neuro notes, suppressing his myoclonus will not ultimately change the prognosis    DVT prophylaxis: heparin injection 5,000 Units Start: 03/30/21 2000 Code Status:   Code Status: Full Code Family Communication: plan of care discussed with patient/nursing staff at bedside.  Disposition: Currently not medically stable for discharge. Status is: Inpatient Remains inpatient appropriate because: Remains inpatient for ongoing IV antibiotics due to patient's fever follow-up respiratory culture.  Plan is for the patient to discharge home with home health 24/7 care. Overall prognosis is poor  Objective: Vitals last 24 hrs: Vitals:   05/04/21 0800 05/04/21 1102 05/04/21 1134 05/04/21 1200  BP:  129/86 127/84  112/76  Pulse: 84 90 93   Resp: (!) 24 18 (!) 22   Temp:  99.8 F (37.7 C)    TempSrc:  Oral    SpO2: 100% 100% 99%   Height:       Weight change:   Physical Examination: General exam: Obtunded able to open eyes at times no other response  HEENT:Oral mucosa moist, Ear/Nose WNL grossly, dentition normal. Respiratory system: bilaterally clear breath sounds trach collar in place  Cardiovascular system: S1 & S2 +, No JVD,. Gastrointestinal system: Abdomen soft, PEG tube in place, soft. Nervous System: Obtunded.   Extremities: LE edema mild,distal peripheral pulses palpable.  Skin: No rashes,no icterus. MSK: Normal muscle bulk,tone, power  Medications reviewed:  Scheduled Meds:  amLODipine  10 mg Per Tube Daily   atropine  2 drop Sublingual QID   carvedilol  25 mg Per Tube BID   chlorhexidine gluconate (MEDLINE KIT)  15 mL Mouth Rinse BID   clonazepam  0.5 mg Per Tube TID   doxazosin  2 mg Per Tube Daily   feeding supplement (JEVITY 1.5 CAL/FIBER)  355 mL Per Tube TID WC   feeding supplement (PROSource TF)  45 mL Per Tube BID   fiber  1 packet Per Tube BID   free water  400 mL Per Tube Q6H   Gerhardt's butt cream   Topical Daily   heparin  5,000 Units Subcutaneous Q8H   hydrALAZINE  75 mg Per  Tube Q8H   isosorbide dinitrate  20 mg Per Tube TID   levETIRAcetam  750 mg Per Tube BID   mouth rinse  15 mL Mouth Rinse q12n4p   pantoprazole sodium  40 mg Per Tube QHS   scopolamine  1 patch Transdermal Q72H   valproic acid  750 mg Per Tube TID   Continuous Infusions:  cefTAZidime (FORTAZ)  IV 2 g (05/04/21 6578)      Diet Order     None        Nutrition Problem: Inadequate oral intake Etiology: inability to eat Signs/Symptoms: NPO status Interventions: Tube feeding, Prostat   Intake/Output Summary (Last 24 hours) at 05/04/2021 1336 Last data filed at 05/04/2021 1104 Gross per 24 hour  Intake --  Output 1950 ml  Net -1950 ml   Net IO Since Admission:  9,559.5 mL [05/04/21 1336]  Wt Readings from Last 3 Encounters:  03/30/21 101.8 kg  12/06/19 106.1 kg  04/08/18 113.4 kg     Unresulted Labs (From admission, onward)    None     Data Reviewed: I have personally reviewed following labs and imaging studies CBC: Recent Labs  Lab 04/28/21 0522 04/30/21 1218 05/02/21 0806 05/03/21 0939  WBC 11.5* 11.5* 13.8* 10.6*  NEUTROABS 7.5  --   --   --   HGB 11.6* 11.4* 12.5* 12.9*  HCT 35.5* 36.3* 37.8* 38.9*  MCV 94.2 95.5 93.8 93.7  PLT 175 191 196 469   Basic Metabolic Panel: Recent Labs  Lab 04/30/21 1218 05/02/21 0806  NA 138 139  K 4.3 4.1  CL 99 101  CO2 31 30  GLUCOSE 105* 113*  BUN 29* 27*  CREATININE 0.84 0.82  CALCIUM 8.5* 8.5*   GFR: Estimated Creatinine Clearance: 131.6 mL/min (by C-G formula based on SCr of 0.82 mg/dL). Liver Function Tests: No results for input(s): AST, ALT, ALKPHOS, BILITOT, PROT, ALBUMIN in the last 168 hours. No results for input(s): LIPASE, AMYLASE in the last 168 hours. No results for input(s): AMMONIA in the last 168 hours. Coagulation Profile: No results for input(s): INR, PROTIME in the last 168 hours. Cardiac Enzymes: No results for input(s): CKTOTAL, CKMB, CKMBINDEX, TROPONINI in the last 168 hours. BNP (last 3 results) No results for input(s): PROBNP in the last 8760 hours. HbA1C: No results for input(s): HGBA1C in the last 72 hours. CBG: No results for input(s): GLUCAP in the last 168 hours. Lipid Profile: No results for input(s): CHOL, HDL, LDLCALC, TRIG, CHOLHDL, LDLDIRECT in the last 72 hours. Thyroid Function Tests: No results for input(s): TSH, T4TOTAL, FREET4, T3FREE, THYROIDAB in the last 72 hours. Anemia Panel: No results for input(s): VITAMINB12, FOLATE, FERRITIN, TIBC, IRON, RETICCTPCT in the last 72 hours. Sepsis Labs: Recent Labs  Lab 05/02/21 0853 05/02/21 1140  LATICACIDVEN 1.6 1.5    Recent Results (from the past 240 hour(s))  Culture, Respiratory w  Gram Stain     Status: None (Preliminary result)   Collection Time: 05/02/21  7:13 AM   Specimen: Tracheal Aspirate; Respiratory  Result Value Ref Range Status   Specimen Description TRACHEAL ASPIRATE  Final   Special Requests NONE  Final   Gram Stain   Final    MODERATE SQUAMOUS EPITHELIAL CELLS PRESENT ABUNDANT WBC PRESENT, PREDOMINANTLY PMN FEW GRAM NEGATIVE RODS MODERATE GRAM POSITIVE COCCI    Culture   Final    CULTURE REINCUBATED FOR BETTER GROWTH Performed at [facility], [place] [address], [place], [place] [address]  Report Status PENDING  Incomplete    Antimicrobials: Anti-infectives (From admission, onward)    Start     Dose/Rate Route Frequency Ordered Stop   05/02/21 1000  cefTAZidime (FORTAZ) 2 g in sodium chloride 0.9 % 100 mL IVPB        2 g 200 mL/hr over 30 Minutes Intravenous Every 8 hours 05/02/21 0846     04/26/21 0756  ceFAZolin (ANCEF) 2-4 GM/100ML-% IVPB  Status:  Discontinued       Note to Pharmacy: [name] L: cabinet override      04/26/21 0756 04/26/21 0855   04/26/21 0600  ceFAZolin (ANCEF) IVPB 2g/100 mL premix        2 g 200 mL/hr over 30 Minutes Intravenous Every 8 hours 04/25/21 1131 04/26/21 0915   04/23/21 1400  cefTAZidime (FORTAZ) 1 g in sodium chloride 0.9 % 100 mL IVPB        1 g 200 mL/hr over 30 Minutes Intravenous Every 8 hours 04/23/21 1256 04/25/21 2331   04/20/21 1300  ceFEPIme (MAXIPIME) 2 g in sodium chloride 0.9 % 100 mL IVPB  Status:  Discontinued        2 g 200 mL/hr over 30 Minutes Intravenous Every 8 hours 04/20/21 1205 04/23/21 1241   04/14/21 0930  vancomycin (VANCOREADY) IVPB 1250 mg/250 mL        1,250 mg 166.7 mL/hr over 90 Minutes Intravenous Every 24 hours 04/13/21 0902 04/19/21 1247   04/13/21 0930  vancomycin (VANCOREADY) IVPB 2000 mg/400 mL        2,000 mg 200 mL/hr over 120 Minutes Intravenous  Once 04/13/21 0831 04/13/21 1158   04/12/21 0815  ceFEPIme (MAXIPIME) 2 g in sodium chloride 0.9 % 100 mL  IVPB  Status:  Discontinued        2 g 200 mL/hr over 30 Minutes Intravenous Every 8 hours 04/12/21 0724 04/13/21 0829   04/06/21 1800  ceFAZolin (ANCEF) IVPB 2g/100 mL premix        2 g 200 mL/hr over 30 Minutes Intravenous Every 8 hours 04/06/21 1403 04/10/21 1913   04/04/21 1000  ceFEPIme (MAXIPIME) 2 g in sodium chloride 0.9 % 100 mL IVPB  Status:  Discontinued        2 g 200 mL/hr over 30 Minutes Intravenous Every 8 hours 04/04/21 0919 04/06/21 1403      Culture/Microbiology    Component Value Date/Time   SDES TRACHEAL ASPIRATE 05/02/2021 0713   SPECREQUEST NONE 05/02/2021 0713   CULT  05/02/2021 0713    CULTURE REINCUBATED FOR BETTER GROWTH Performed at [facility] [address] [place], [place] [address]    REPTSTATUS PENDING 05/02/2021 8466    Other culture-see note  Radiology Studies: No results found.   LOS: 35 days   Antonieta Pert, MD [facility]  05/04/2021, 1:36 PM

## 2021-05-05 LAB — CULTURE, RESPIRATORY W GRAM STAIN

## 2021-05-05 NOTE — Progress Notes (Signed)
?  NAME:  Aundra Espin, MRN:  [id number], DOB:  [birth year], LOS: 36 ?ADMISSION DATE:  03/30/2021, CONSULTATION DATE:  1/19 ?REFERRING MD:  [place], CHIEF COMPLAINT:  Found down, cardiac arrest  ? ?Brief Pt Description / Synopsis:  ?51 year old male with out-of-hospital V. fib cardiac arrest in setting of respiratory arrest due to suspected angioedema from ACE inhibitor leading to asphyxiation.  Concern on his 12 lead EKG for anterolateral ST elevation so he was taken to the cath lab where his study showed no significant coronary artery disease and an LVEF < 20%  Now with anoxic brain injury. ? ?Pertinent  Medical History  ?Hypertension ? ?Significant Hospital Events: ?Including procedures, antibiotic start and stop dates in addition to other pertinent events   ?1/19 admission, left heart cath without significant CAD.  Concern for anoxic brain injury ?1/20: MRI pending, Neuro following ?1/21: MRI shows areas of hypoxic-ischemic injury bilaterally. Fevered; cultures obtained. ?1/23 severe brain damage ?1/24 evidence of severe brain damage, remains critically ill ?1/25: Plan for PEG today. Neuro exam unchanged.  Does exhibit new eyelid tremor like movements and lip/jaw tremor like vs clonus movement.  Bolused Keppra and increased maintenance dose.  Repeat EEG. CTH: concerning for progression of anoxic injury.  ?1/26: Repeat EEG unchanged and continues to show myoclonic seizures despite addition of Valproic acid yesterday. transfer to [facility] for continuous EEG ?1/28: Continues to have myoclonic sz on EEG ?1/29 No acute issues overnight ?2/3 no meaningful neuro recovery. Diuresed ?2/5 unchanged ?2/6 New fever to 102, WBC 16K - treated with vancomycin for 7d based on cultures.  ?2/11: percutaneous tracheostomy ?2/13 >2/20 on trach collar. No vent.  ?2/20 Trach change to 6 cuffless ?2/27 status unchanged ? ?Interim History / Subjective:  ?Remains on TC ?Thick secretions ?Unresponsive in bed. ? ?Objective   ?Blood  pressure (!) 157/104, pulse 92, temperature 99.4 ?F (37.4 ?C), temperature source Oral, resp. rate (!) 22, height 5\' 10"  (1.778 m), weight 106.2 kg, SpO2 100 %. ?   ?FiO2 (%):  [28 %] 28 %  ? ?Intake/Output Summary (Last 24 hours) at 05/05/2021 0758 ?Last data filed at 05/05/2021 0500 ?Gross per 24 hour  ?Intake 99.88 ml  ?Output 1550 ml  ?Net -1450.12 ml  ? ? ? ?Filed Weights  ? ?Examination:  ?Comatose man in bed ?Trach in place with thick yellow secretions ?Ext with muscle wasting and anasarca ?GCS3, will move mouth nonpurposefully ? ?No labs since 2/28 ?No imaging since 2/28 ? ?Ancillary tests personally reviewed:    ? ?Assessment & Plan:  ?OOH Vfib arrest ?Post arrest anoxic brain injury, severe ?Post arrest myoclonus improved with klonopin, no AEDs recommended ?Post arrest respiratory failure- vent dependent due to mental status and secretions ?Post arrest cardiomyopathy ?Post arrest AKI, improved/stable ?HTN- stable on current regimen ?HCAP w/ MSSA- ABX > 2/21 ? ?Plan:  ?- Continue trach collar ?- Continue klonipin, if breakthrough myoclonus okay to go up on this ?- On fortaz for pseudomonas in trachea aspirate ?- Appreciate SLP ?- Do not think he will be able to protect glottic secretions if decannulated and will end up back on vent, continue SNF placement efforts vs. Comfort care depending on family wishes ? ?Best Practice (right click and "Reselect all SmartList Selections" daily)  ?Per trh ? ? ?3/21, MD ?[facility] ?See Amion for contact info ?05/05/2021 7:58 AM ? ? ? ? ? ? ? ? ? ? ? ? ? ? ? ? ? ? ?

## 2021-05-05 NOTE — Progress Notes (Signed)
PROGRESS NOTE Billy Cherry  KDX:[id number] DOB: 08/05/1970 DOA: 03/30/2021 PCP: Patient, No Pcp Per (Inactive)   Brief Narrative/Hospital Course: 51 y/o male with HTN who was found to be unresponsive by his wife. It tooke 5-10 min for EMS to arrive. EMS found him to be in V fib with agonal respirations. Defibrillated once and then went into asystole. Given 2 round of Epinephrineand had ROSC.  Futher history later obtained from wife that the patient had  angioedema however there is no report of this per multiple physician's notes from admission.  EKG after ROSC showed anterolateral ST elevation and he was taken to the cath lab. Dr Josefa Half performed cath> insignificant CAD and EF 45-50%. Patient remained on ventilator after cath and was admitted to the ICU. Underwent TTM. Given Propofol.  1/19- neuro consulted- noted to have rhythmic twitching of his lips/tongue and was started on keppra. MRI brain> hypoxic ischemic injury involving bilateral ACA-MCA watershed cortex, bilat occipital cortex and BG. ECHO showed EF of 20% with global hypokinesis. Treated for VAP.   1/25> showed episodes of eye opening and lip twitching, more prominent when stimulated consistent with myoclonic seizures.  Additionally EEG was suggestive of severe diffuse encephalopathy.  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  1/27- Neuro, Dr Hortense Ramal had [place] conversation with wife who stated that she wanted to continue aggressive care  2/2 palliative care note> wife did not show up for meeting- wife was called  and noted that she "hopes to push out any decisions regarding her husband treatment  until his mother/[name] comes to [place].  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 Received Cefepime.  - IR consulted for PEG tube after speaking with wife. 2/20 Change trach to 6 cuffless   Transition of care team, helping arrange home health.  Spike Fever 2/28, probably aspirate 2/27. Treating for PNA     Subjective: Seen and examined this morning he remains obtunded PEG tube in place trach collar in place at 5 L No acute events overnight. 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 Remains obtunded with anoxic encephalopathy remains on Keppra, for myoclonus and can increase Klonopin for breakthrough myoclonus Continue frequent positioning supportive care. At this time plan is for 24 hours/week care, wife off on Thursday when she will be doing the care, wife may have more family member to help her with the care, other time home health [place], Enteral tube feeding trach kit and supplies have been arranged by TOC and orders signed.  Anoxic encephalopathy ([place]) See above   RLL pneumonia Sepsis secondary to pneumonia pneumonia.   -had completed 7 day of Vanc on 2/15 for MSSA and [name] -2/16>having fevers as high  at 101- lactic acid normal, RR in 30-40s, tachycardic, large amounts of yellow and bloody sputum -2/16 CXR > LLL opacity improved- RLL opacity worse and now has RLL effusion. - Sputum culture grew  pseudomonas aeruginosa -Completed antibiotics for Pseudomonas on  2/21. -Spike fever 2/28: chest xray showed- Bibasilar atelectasis versus consolidation. Started on ceftazidime- day # 4-cont same-still had fever spike 3/21 Respiratory culture 2/28- pseudomonas Continue secretion management and scopolamine.  Acute respiratory failure ([place]) See above   Dysphagia PEG tube in place.  Vomited 2/27-KUB negative for obstruction.  Tolerating tube feeds at this time.   Obesity, Class I, BMI 30-34.9 BMI 33.  HTN (hypertension) Blood pressures poorly controlled-likely contributed to patient's cardiac arrest.  Started on Coreg Isordil amlodipine Cardura hydralazine and PRN Labetalol. Hydralazine dose was decreased 3/1- bp stable now cont meds  HFrEF (heart failure with reduced ejection fraction) ([name]) EF less than 20%, volume status stable, continue coreg, imdur, hydralazine.  Fluid management closely, on tube feeds. Increased Hydralazine 3/01 for elevated BP  Hypernatremia Resolved with adjustment of free water. on free water 400 cc Q 6 hrs for now  Myoclonus Seen by neurology.  Continue current AED with Klonopin, Keppra. per neuro notes, suppressing his myoclonus will not ultimately change the prognosis    DVT prophylaxis: heparin injection 5,000 Units Start: 03/30/21 2000 Code Status:   Code Status: Full Code Family Communication: plan of care discussed with patient/nursing staff at bedside.  Disposition: Currently not medically stable for discharge. Status is: Inpatient Remains inpatient appropriate because: Remains inpatient for ongoing IV antibiotics due to patient's fever follow-up respiratory culture.  Plan is for the patient to discharge home with home health 24/7 care. Overall prognosis is poor  Objective: Vitals last 24 hrs: Vitals:   05/05/21 0423 05/05/21 0446 05/05/21 0800 05/05/21 0827  BP:  (!) 157/104 (!) 158/95 (!) 153/101  Pulse: 86 92 91 87  Resp: (!) 27 (!) 22 (!) 29 16  Temp:  99.4 F (37.4 C) 99.4 F (37.4 C)   TempSrc:   Oral Oral   SpO2: 100% 100% 100% 100%  Height:       Weight change:   Physical Examination: General exam: Obtunded no response, older than stated age, weak appearing. HEENT:Oral mucosa moist, Ear/Nose WNL grossly, dentition normal. Respiratory system: bilaterally diminished, trach collar in place no use of accessory muscle Cardiovascular system: S1 & S2 +, No JVD,. Gastrointestinal system: Abdomen soft,NT,ND,BS+. PEG+ Nervous System: Trended unresponsive. Extremities: LE ankle edema none, distal peripheral pulses palpable.  Skin: No rashes,no icterus. MSK: Normal muscle bulk,tone, power   Medications reviewed:  Scheduled Meds:  amLODipine  10 mg Per Tube Daily   atropine  2 drop Sublingual QID   carvedilol  25 mg Per Tube BID   chlorhexidine gluconate (MEDLINE KIT)  15 mL Mouth Rinse BID   clonazepam  0.5 mg Per Tube TID   doxazosin  2 mg Per Tube Daily   feeding supplement (JEVITY 1.5 CAL/FIBER)  355 mL Per Tube TID WC   feeding supplement (PROSource TF)  45 mL Per Tube BID   fiber  1 packet Per Tube BID   free water  400 mL Per Tube Q6H   Gerhardt's butt cream   Topical Daily   heparin  5,000 Units Subcutaneous Q8H   hydrALAZINE  75 mg Per Tube Q8H   isosorbide dinitrate  20 mg Per Tube TID   levETIRAcetam  750 mg Per Tube BID   mouth rinse  15 mL Mouth Rinse q12n4p   pantoprazole sodium  40 mg Per Tube QHS   scopolamine  1 patch Transdermal Q72H   valproic acid  750 mg Per Tube TID   Continuous Infusions:  cefTAZidime (FORTAZ)  IV 2 g (05/05/21 1040)      Diet Order     None        Nutrition Problem: Inadequate oral intake Etiology: inability to eat Signs/Symptoms: NPO status Interventions: Tube feeding, Prostat   Intake/Output Summary (Last 24 hours) at 05/05/2021 1143 Last data filed at 05/05/2021 0500 Gross per 24 hour  Intake 99.88 ml  Output 1250 ml  Net -1150.12 ml   Net IO Since Admission: 8,409.38 mL [05/05/21 1143]  Wt Readings from Last 3  Encounters:  03/30/21 101.8 kg  12/06/19 106.1 kg  04/08/18 113.4 kg     Unresulted Labs (From admission, onward)    None     Data Reviewed: I have personally reviewed following labs and imaging studies CBC: Recent Labs  Lab 04/30/21 1218 05/02/21 0806 05/03/21 0939  WBC 11.5* 13.8* 10.6*  HGB 11.4* 12.5* 12.9*  HCT 36.3* 37.8* 38.9*  MCV 95.5 93.8 93.7  PLT 191 196 897   Basic Metabolic Panel: Recent Labs  Lab 04/30/21 1218 05/02/21 0806  NA 138 139  K 4.3 4.1  CL 99 101  CO2 31 30  GLUCOSE 105* 113*  BUN 29* 27*  CREATININE 0.84 0.82  CALCIUM 8.5* 8.5*   GFR: Estimated Creatinine Clearance: 131.6 mL/min (by C-G formula based on SCr of 0.82 mg/dL). Liver Function Tests: No results for input(s): AST, ALT, ALKPHOS, BILITOT, PROT, ALBUMIN in the last 168 hours. No results for input(s): LIPASE, AMYLASE in the last 168 hours. No results for input(s): AMMONIA in the last 168 hours. Coagulation Profile: No results for input(s): INR, PROTIME in the last 168 hours. Cardiac Enzymes: No results for input(s): CKTOTAL, CKMB, CKMBINDEX, TROPONINI in the last 168 hours. BNP (last 3 results) No results for input(s): PROBNP in the last 8760 hours. HbA1C: No results for input(s): HGBA1C in the last 72 hours. CBG: No results for input(s): GLUCAP in the last 168 hours. Lipid Profile: No results for input(s): CHOL, HDL, LDLCALC, TRIG, CHOLHDL, LDLDIRECT in the last 72 hours. Thyroid Function Tests: No results for input(s): TSH, T4TOTAL, FREET4, T3FREE, THYROIDAB in the last 72 hours. Anemia Panel: No results for input(s): VITAMINB12, FOLATE, FERRITIN, TIBC, IRON, RETICCTPCT in the last 72 hours. Sepsis Labs: Recent Labs  Lab 05/02/21 0853 05/02/21 1140  LATICACIDVEN 1.6 1.5    Recent Results (from the past 240 hour(s))  Culture, Respiratory w Gram Stain     Status: None   Collection Time: 05/02/21  7:13 AM   Specimen: Tracheal Aspirate; Respiratory  Result Value  Ref Range Status   Specimen Description TRACHEAL ASPIRATE  Final   Special Requests NONE  Final   Gram Stain   Final    MODERATE SQUAMOUS EPITHELIAL CELLS PRESENT ABUNDANT WBC PRESENT, PREDOMINANTLY PMN FEW GRAM NEGATIVE RODS MODERATE GRAM POSITIVE COCCI    Culture   Final    FEW PSEUDOMONAS AERUGINOSA WITHIN MIXED NORMAL RESPIRATORY FLORA Performed at [facility], [place] [address] [place], [place] [address]    Report Status 05/05/2021 FINAL  Final   Organism ID, Bacteria PSEUDOMONAS AERUGINOSA  Final      Susceptibility   Pseudomonas aeruginosa - MIC*    CEFTAZIDIME 4 SENSITIVE Sensitive     CIPROFLOXACIN <=0.25 SENSITIVE Sensitive  GENTAMICIN <=1 SENSITIVE Sensitive     IMIPENEM 2 SENSITIVE Sensitive     PIP/TAZO 8 SENSITIVE Sensitive     CEFEPIME 2 SENSITIVE Sensitive     * FEW PSEUDOMONAS AERUGINOSA    Antimicrobials: Anti-infectives (From admission, onward)    Start     Dose/Rate Route Frequency Ordered Stop   05/02/21 1000  cefTAZidime (FORTAZ) 2 g in sodium chloride 0.9 % 100 mL IVPB        2 g 200 mL/hr over 30 Minutes Intravenous Every 8 hours 05/02/21 0846     04/26/21 0756  ceFAZolin (ANCEF) 2-4 GM/100ML-% IVPB  Status:  Discontinued       Note to Pharmacy: [name] L: cabinet override      04/26/21 0756 04/26/21 0855   04/26/21 0600  ceFAZolin (ANCEF) IVPB 2g/100 mL premix        2 g 200 mL/hr over 30 Minutes Intravenous Every 8 hours 04/25/21 1131 04/26/21 0915   04/23/21 1400  cefTAZidime (FORTAZ) 1 g in sodium chloride 0.9 % 100 mL IVPB        1 g 200 mL/hr over 30 Minutes Intravenous Every 8 hours 04/23/21 1256 04/25/21 2331   04/20/21 1300  ceFEPIme (MAXIPIME) 2 g in sodium chloride 0.9 % 100 mL IVPB  Status:  Discontinued        2 g 200 mL/hr over 30 Minutes Intravenous Every 8 hours 04/20/21 1205 04/23/21 1241   04/14/21 0930  vancomycin (VANCOREADY) IVPB 1250 mg/250 mL        1,250 mg 166.7 mL/hr over 90 Minutes Intravenous Every 24  hours 04/13/21 0902 04/19/21 1247   04/13/21 0930  vancomycin (VANCOREADY) IVPB 2000 mg/400 mL        2,000 mg 200 mL/hr over 120 Minutes Intravenous  Once 04/13/21 0831 04/13/21 1158   04/12/21 0815  ceFEPIme (MAXIPIME) 2 g in sodium chloride 0.9 % 100 mL IVPB  Status:  Discontinued        2 g 200 mL/hr over 30 Minutes Intravenous Every 8 hours 04/12/21 0724 04/13/21 0829   04/06/21 1800  ceFAZolin (ANCEF) IVPB 2g/100 mL premix        2 g 200 mL/hr over 30 Minutes Intravenous Every 8 hours 04/06/21 1403 04/10/21 1913   04/04/21 1000  ceFEPIme (MAXIPIME) 2 g in sodium chloride 0.9 % 100 mL IVPB  Status:  Discontinued        2 g 200 mL/hr over 30 Minutes Intravenous Every 8 hours 04/04/21 0919 04/06/21 1403      Culture/Microbiology    Component Value Date/Time   SDES TRACHEAL ASPIRATE 05/02/2021 0713   SPECREQUEST NONE 05/02/2021 0713   CULT  05/02/2021 0093    FEW PSEUDOMONAS AERUGINOSA WITHIN MIXED NORMAL RESPIRATORY FLORA Performed at [facility], [place] [address], [place], [place] [address]    REPTSTATUS 05/05/2021 FINAL 05/02/2021 9371    Other culture-see note  Radiology Studies: No results found.   LOS: 26 days   Antonieta Pert, MD [facility]  05/05/2021, 11:43 AM

## 2021-05-05 NOTE — Progress Notes (Signed)
Speech Language Pathology Treatment: [name] Speaking valve  ?Patient Details ?Name: Billy Cherry ?MRN: [id number] ?DOB: 07/11/1970 ?Today's Date: 05/05/2021 ?Time: 8366-2947 ?SLP Time Calculation (min) (ACUTE ONLY): 12 min ? ?Assessment / Plan / Recommendation ?Clinical Impression ? Goal has been to educate family re: PMV. Note in chart family supposed to come in yesterday afternoon for trainng with PT/OT and they were not present yesterday. Billy Cherry seen today for PMV donned with signs of air trapping. Valve fell from trach hub multiple times first 3-4 minutes. Eventually air with adequate movement to glottis and detected from nasal cavity. He did not respond to verbal cues only with deep nail bed pressure did he open eyes and move head minimally. No attempts to vocalize and therapist suctioned pooled saliva from oral cavity.  HR, 89, RR 21 and SpO2 100%. Pt can wear valve with therapy. Continue ST.  ?  ?HPI HPI: Pt is a 51 y.o. male who presented 03/23/21 as a code STEMI after being found unresponsive. V. fib cardiac arrest in setting of respiratory arrest due to suspected angioedema from ACE inhibitor leading to asphyxiation.  Concern on his 12 lead EKG for anterolateral ST elevation so he was taken to the cath lab where his study showed no significant coronary artery disease and an LVEF < 20%. 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 S/p cortak placement 1/27, trach placement 2/11; downsized to 6 cuffless.  ETT 1/19 - 2/11. PMH: HTN ?  ?   ?SLP Plan ? Continue with current plan of care ? ?  ?  ?Recommendations for follow up therapy are one component of a multi-disciplinary discharge planning process, led by the attending physician.  Recommendations may be updated based on patient status, additional functional criteria and insurance authorization. ?  ? ?Recommendations  ?   ?   ? Patient may use  Passy-Muir Speech Valve: Intermittently with supervision ?PMSV Supervision: Full  ?   ? ? ? ? Oral Care Recommendations: Oral care QID ?Follow Up Recommendations: SLP at Long-term acute care hospital ?Assistance recommended at discharge: Frequent or constant Supervision/Assistance ?SLP Visit Diagnosis: Aphonia (R49.1) ?Plan: Continue with current plan of care ? ? ? ? ?  ?  ? ? ?Royce Macadamia ? ?05/05/2021, 10:57 AM ? ?Breck Coons [place].Ed CCC-SLP ?Speech-Language Pathologist ?Pager [phone] ?Office [phone] ? ?

## 2021-05-06 NOTE — Progress Notes (Signed)
Wife called and said she unexpectedly had her 51yo grandchild to babysit and could not bring him in, but she is eager for her training another day. ?She thinks it will be similar to her job at work.   ?

## 2021-05-06 NOTE — Progress Notes (Signed)
Mobility Specialist Progress Note: ? ? 05/06/21 1145  ?Mobility  ?Activity Turned to left side ?(transferred beds)  ?Level of Assistance Total care  ?Assistive Device  ?(chuck pads)  ?Activity Response Tolerated well  ?$Mobility charge 1 Mobility  ? ?RN requesting assistance with transfer beds. Required total assist +3. Pt left in bed turned onto left side. ? ?Nelta Numbers ?Acute Rehab ?Phone: [phone] ?Office Phone: [phone] ? ?

## 2021-05-06 NOTE — Progress Notes (Signed)
?   05/06/21 0714  ?Assess: MEWS Score  ?Temp 99.7 ?F (37.6 ?C)  ?BP (!) 169/110  ?Pulse Rate 91  ?ECG Heart Rate 91  ?Resp (!) 26  ?SpO2 99 %  ?O2 Device Tracheostomy Collar  ?Assess: MEWS Score  ?MEWS Temp 0  ?MEWS Systolic 0  ?MEWS Pulse 0  ?MEWS RR 2  ?MEWS LOC 3  ?MEWS Score 5  ?MEWS Score Color Red  ?Assess: if the MEWS score is Yellow or Red  ?Were vital signs taken at a resting state? Yes  ?Focused Assessment Change from prior assessment (see assessment flowsheet)  ?Early Detection of Sepsis Score *See Row Information* Low  ?MEWS guidelines implemented *See Row Information* Yes  ?Treat  ?MEWS Interventions Administered scheduled meds/treatments  ?Pain Scale Faces  ?Pain Score 0  ?Take Vital Signs  ?Increase Vital Sign Frequency  Red: Q 1hr X 4 then Q 4hr X 4, if remains red, continue Q 4hrs  ?Escalate  ?MEWS: Escalate Red: discuss with charge nurse/RN and provider, consider discussing with RRT  ?Notify: Charge Nurse/RN  ?Name of Charge Nurse/RN Notified Jasmina  ?Date Charge Nurse/RN Notified 05/06/21  ?Time Charge Nurse/RN Notified [phone]  ?Notify: Provider  ?Provider Name/Title Maren [place]  ?Date Provider Notified 05/06/21  ?Time Provider Notified [phone]  ?Notification Type Page  ?Provider response Evaluate remotely  ?Date of Provider Response 05/06/21  ?Time of Provider Response [phone]  ?Document  ?Patient Outcome Not stable and remains on department  ?Progress note created (see row info) Yes  ? ?Giving medications as ordered, will continue to monitor  ?

## 2021-05-06 NOTE — Progress Notes (Signed)
PROGRESS NOTE Billy Cherry  GNF:[id number] DOB: 31-May-1970 DOA: 03/30/2021 PCP: Patient, No Pcp Per (Inactive)   Brief Narrative/Hospital Course: 51 y/o male with HTN who was found to be unresponsive by his wife. It tooke 5-10 min for EMS to arrive. EMS found him to be in V fib with agonal respirations. Defibrillated once and then went into asystole. Given 2 round of Epinephrineand had ROSC.  Futher history later obtained from wife that the patient had  angioedema however there is no report of this per multiple physician's notes from admission.  EKG after ROSC showed anterolateral ST elevation and he was taken to the cath lab. Dr Josefa Half performed cath> insignificant CAD and EF 45-50%. Patient remained on ventilator after cath and was admitted to the ICU. Underwent TTM. Given Propofol.  1/19- neuro consulted- noted to have rhythmic twitching of his lips/tongue and was started on keppra. MRI brain> hypoxic ischemic injury involving bilateral ACA-MCA watershed cortex, bilat occipital cortex and BG. ECHO showed EF of 20% with global hypokinesis. Treated for VAP.   1/25> showed episodes of eye opening and lip twitching, more prominent when stimulated consistent with myoclonic seizures.  Additionally EEG was suggestive of severe diffuse encephalopathy.  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  1/27- Neuro, Dr Hortense Ramal had [place] conversation with wife who stated that she wanted to continue aggressive care  2/2 palliative care note> wife did not show up for meeting- wife was called  and noted that she "hopes to push out any decisions regarding her husband treatment  until his mother/[name] comes to [place].  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 Received Cefepime.  - IR consulted for PEG tube after speaking with wife. 2/20 Change trach to 6 cuffless   Transition of care team, helping arrange home health.  Spike Fever 2/28, probably aspirate 2/27-respiratory culture growing Pseudomonas-and remains on Fortaz Last temp spike last 05/04/21 4 pm.     Subjective: Seen and examined.  Remains obtunded unresponsive on trach collar stable oxygen requirement hemodynamically stable although intermittent tachypnea which drives his MEWS t ored His last temp spike of 100.7 at 4 PM  05/04/21 WBC count stable  Assessment and Plan: * Cardiac arrest [facility]) Cardiac arrest  Postarrest anoxic encephalopathy Postarrest respiratory failure s/p TC S/P PEG tube placement Postarrest myoclonus: his EF is less than 20%, now on tracheostomy with trach collar at 5l -SLP following, on IV antibiotics as below, PCCM following less likely reported glottic secretion if decannulated  Cont PEG tube getting bolus tube feeding. Remains obtunded with anoxic encephalopathy-on Keppra, for myoclonus and can increase Klonopin for breakthrough myoclonus Continue frequent positioning supportive care. At this time plan is home with home health-Home health/patient's wife and family will be taking turns to take care of him he will need around-the-clock care.Enteral tube feeding trach kit and supplies have been arranged by TOC and orders signed  Anoxic encephalopathy ([place]) See above   RLL pneumonia Sepsis secondary to Pseudomonas pneumonia.   Previously had completed vancomycin for MSSA and [name]. He had temperature spike again on 2/20 chest x-ray showed pneumonia respiratory culture with Pseudomonas 2/28- and  on ceftazidime- day # 5: Complete at least 7 days of antibiotics so far no temperature spike since 3/2.Continue secretion management and scopolamine,  continue frequent suctioning respiratory care. Unfortunately he remains at risk of ongoing respiratory compromise infection bed sore.  Acute respiratory failure ([place]) See above   Dysphagia PEG tube in place.  Vomited 2/27-KUB negative for obstruction.  Tolerating tube feeds at this time.   Obesity, Class I, BMI 30-34.9 BMI 33.  HTN (hypertension) Blood pressures poorly controlled-likely contributed to patient's cardiac arrest.  Started on Coreg Isordil amlodipine Cardura hydralazine and PRN Labetalol. Hydralazine dose was decreased 3/1- bp stable now cont meds  HFrEF (heart failure with reduced ejection fraction) ([name]) EF less than 20%, volume status stable, continue coreg, imdur, hydralazine.  Fluid management closely, on tube feeds. Increased Hydralazine 3/01 for elevated BP  Hypernatremia Resolved with adjustment of free water. on free water 400 cc Q 6 hrs for now  Myoclonus Seen by neurology.  Continue current AED with Klonopin, Keppra. per neuro notes, suppressing his myoclonus will not ultimately change the prognosis   DVT prophylaxis:heparin injection 5,000 Units Start: 03/30/21 2000 Code Status:   Code Status: Full Code Family Communication:Plan of care discussed with  RN.I called and updated his wife. She tells me she is working with financial department and trying to arrange [facility], waiting on medicaid.She says HH is not setup yet. She says she has 17 yr old son and trying to set up child care so that she can come to hospital and learn trach collar and tube feeding, OT.  Disposition:Currently not medically stable for discharge. Status SF:[id number] Remains inpatient appropriate because: Remains inpatient for ongoing IV antibiotics due to patient's recurrent pneumonia now with Pseudomonas.  Completed 7 days of IV antibiotics after which if he remains afebrile can discharge home with home health and 24/7 care. Overall prognosis is poor  Objective: Vitals last 24 hrs: Vitals:    05/06/21 0714 05/06/21 0800 05/06/21 0911 05/06/21 1005  BP: (!) 169/110 (!) 181/111 (!) 156/100 135/89  Pulse: 91 91 92 84  Resp: (!) 26 (!) 28 (!) 25 (!) 25  Temp: 99.7 F (37.6 C)     TempSrc: Oral     SpO2: 99% 100% 98% 100%  Height:       Weight change:   Physical Examination: General exam: Obtunded unresponsive  HEENT:Oral mucosa moist, Ear/Nose WNL grossly, dentition normal. Respiratory system: bilaterally diminished, air entry present, on trach collar, no use of accessory muscle Cardiovascular system: S1 & S2 +, No JVD,. Gastrointestinal system: Abdomen soft,NT,ND, PEG present BS+ Nervous System: Obtunded and unresponsive Extremities: edema neg,distal peripheral pulses palpable.  Skin: No rashes,no icterus. MSK: Normal muscle bulk,tone, power   Medications reviewed:  Scheduled Meds:  amLODipine  10 mg Per Tube Daily   atropine  2 drop Sublingual QID   carvedilol  25 mg Per Tube BID   chlorhexidine gluconate (MEDLINE KIT)  15 mL Mouth Rinse BID   clonazepam  0.5 mg Per Tube TID   doxazosin  2 mg Per Tube Daily   feeding supplement (JEVITY 1.5 CAL/FIBER)  355 mL Per Tube TID WC   feeding supplement (PROSource TF)  45 mL Per Tube BID   fiber  1 packet Per Tube BID   free water  400 mL Per Tube Q6H   Gerhardt's butt cream   Topical Daily   heparin  5,000 Units Subcutaneous Q8H   hydrALAZINE  75 mg Per Tube Q8H   isosorbide  dinitrate  20 mg Per Tube TID   levETIRAcetam  750 mg Per Tube BID   mouth rinse  15 mL Mouth Rinse q12n4p   pantoprazole sodium  40 mg Per Tube QHS   scopolamine  1 patch Transdermal Q72H   valproic acid  750 mg Per Tube TID   Continuous Infusions:  cefTAZidime (FORTAZ)  IV 2 g (05/06/21 5900)      Diet Order     None        Nutrition Problem: Inadequate oral intake Etiology: inability to eat Signs/Symptoms: NPO status Interventions: Tube feeding, Prostat   Intake/Output Summary (Last 24 hours) at 05/06/2021 1057 Last data filed  at 05/06/2021 0414 Gross per 24 hour  Intake --  Output 1675 ml  Net -1675 ml   Net IO Since Admission: 6,734.38 mL [05/06/21 1057]  Wt Readings from Last 3 Encounters:  03/30/21 101.8 kg  12/06/19 106.1 kg  04/08/18 113.4 kg     Unresulted Labs (From admission, onward)    None     Data Reviewed: I have personally reviewed following labs and imaging studies CBC: Recent Labs  Lab 04/30/21 1218 05/02/21 0806 05/03/21 0939  WBC 11.5* 13.8* 10.6*  HGB 11.4* 12.5* 12.9*  HCT 36.3* 37.8* 38.9*  MCV 95.5 93.8 93.7  PLT 191 196 194   Basic Metabolic Panel: Recent Labs  Lab 04/30/21 1218 05/02/21 0806  NA 138 139  K 4.3 4.1  CL 99 101  CO2 31 30  GLUCOSE 105* 113*  BUN 29* 27*  CREATININE 0.84 0.82  CALCIUM 8.5* 8.5*   GFR: Estimated Creatinine Clearance: 131.6 mL/min (by C-G formula based on SCr of 0.82 mg/dL). Liver Function Tests: No results for input(s): AST, ALT, ALKPHOS, BILITOT, PROT, ALBUMIN in the last 168 hours. No results for input(s): LIPASE, AMYLASE in the last 168 hours. No results for input(s): AMMONIA in the last 168 hours. Coagulation Profile: No results for input(s): INR, PROTIME in the last 168 hours. Cardiac Enzymes: No results for input(s): CKTOTAL, CKMB, CKMBINDEX, TROPONINI in the last 168 hours. BNP (last 3 results) No results for input(s): PROBNP in the last 8760 hours. HbA1C: No results for input(s): HGBA1C in the last 72 hours. CBG: No results for input(s): GLUCAP in the last 168 hours. Lipid Profile: No results for input(s): CHOL, HDL, LDLCALC, TRIG, CHOLHDL, LDLDIRECT in the last 72 hours. Thyroid Function Tests: No results for input(s): TSH, T4TOTAL, FREET4, T3FREE, THYROIDAB in the last 72 hours. Anemia Panel: No results for input(s): VITAMINB12, FOLATE, FERRITIN, TIBC, IRON, RETICCTPCT in the last 72 hours. Sepsis Labs: Recent Labs  Lab 05/02/21 0853 05/02/21 1140  LATICACIDVEN 1.6 1.5    Recent Results (from the past 240  hour(s))  Culture, Respiratory w Gram Stain     Status: None   Collection Time: 05/02/21  7:13 AM   Specimen: Tracheal Aspirate; Respiratory  Result Value Ref Range Status   Specimen Description TRACHEAL ASPIRATE  Final   Special Requests NONE  Final   Gram Stain   Final    MODERATE SQUAMOUS EPITHELIAL CELLS PRESENT ABUNDANT WBC PRESENT, PREDOMINANTLY PMN FEW GRAM NEGATIVE RODS MODERATE GRAM POSITIVE COCCI    Culture   Final    FEW PSEUDOMONAS AERUGINOSA WITHIN MIXED NORMAL RESPIRATORY FLORA Performed at [facility], [address], [place], [place] [address]    Report Status 05/05/2021 FINAL  Final   Organism ID, Bacteria PSEUDOMONAS AERUGINOSA  Final      Susceptibility  Pseudomonas aeruginosa - MIC*    CEFTAZIDIME 4 SENSITIVE Sensitive     CIPROFLOXACIN <=0.25 SENSITIVE Sensitive     GENTAMICIN <=1 SENSITIVE Sensitive     IMIPENEM 2 SENSITIVE Sensitive     PIP/TAZO 8 SENSITIVE Sensitive     CEFEPIME 2 SENSITIVE Sensitive     * FEW PSEUDOMONAS AERUGINOSA    Antimicrobials: Anti-infectives (From admission, onward)    Start     Dose/Rate Route Frequency Ordered Stop   05/02/21 1000  cefTAZidime (FORTAZ) 2 g in sodium chloride 0.9 % 100 mL IVPB        2 g 200 mL/hr over 30 Minutes Intravenous Every 8 hours 05/02/21 0846     04/26/21 0756  ceFAZolin (ANCEF) 2-4 GM/100ML-% IVPB  Status:  Discontinued       Note to Pharmacy: [name] L: cabinet override      04/26/21 0756 04/26/21 0855   04/26/21 0600  ceFAZolin (ANCEF) IVPB 2g/100 mL premix        2 g 200 mL/hr over 30 Minutes Intravenous Every 8 hours 04/25/21 1131 04/26/21 0915   04/23/21 1400  cefTAZidime (FORTAZ) 1 g in sodium chloride 0.9 % 100 mL IVPB        1 g 200 mL/hr over 30 Minutes Intravenous Every 8 hours 04/23/21 1256 04/25/21 2331   04/20/21 1300  ceFEPIme (MAXIPIME) 2 g in sodium chloride 0.9 % 100 mL IVPB  Status:  Discontinued        2 g 200 mL/hr over 30 Minutes Intravenous Every 8 hours  04/20/21 1205 04/23/21 1241   04/14/21 0930  vancomycin (VANCOREADY) IVPB 1250 mg/250 mL        1,250 mg 166.7 mL/hr over 90 Minutes Intravenous Every 24 hours 04/13/21 0902 04/19/21 1247   04/13/21 0930  vancomycin (VANCOREADY) IVPB 2000 mg/400 mL        2,000 mg 200 mL/hr over 120 Minutes Intravenous  Once 04/13/21 0831 04/13/21 1158   04/12/21 0815  ceFEPIme (MAXIPIME) 2 g in sodium chloride 0.9 % 100 mL IVPB  Status:  Discontinued        2 g 200 mL/hr over 30 Minutes Intravenous Every 8 hours 04/12/21 0724 04/13/21 0829   04/06/21 1800  ceFAZolin (ANCEF) IVPB 2g/100 mL premix        2 g 200 mL/hr over 30 Minutes Intravenous Every 8 hours 04/06/21 1403 04/10/21 1913   04/04/21 1000  ceFEPIme (MAXIPIME) 2 g in sodium chloride 0.9 % 100 mL IVPB  Status:  Discontinued        2 g 200 mL/hr over 30 Minutes Intravenous Every 8 hours 04/04/21 0919 04/06/21 1403      Culture/Microbiology    Component Value Date/Time   SDES TRACHEAL ASPIRATE 05/02/2021 0713   SPECREQUEST NONE 05/02/2021 0713   CULT  05/02/2021 3383    FEW PSEUDOMONAS AERUGINOSA WITHIN MIXED NORMAL RESPIRATORY FLORA Performed at [facility], [place] [address], [place], [place] [address]    REPTSTATUS 05/05/2021 FINAL 05/02/2021 6606    Other culture-see note  Radiology Studies: No results found.   LOS: 58 days   Antonieta Pert, MD [facility]  05/06/2021, 10:57 AM

## 2021-05-07 ENCOUNTER — Inpatient Hospital Stay (HOSPITAL_COMMUNITY): Payer: BC Managed Care – PPO

## 2021-05-07 DIAGNOSIS — R1310 Dysphagia, unspecified: Secondary | ICD-10-CM

## 2021-05-07 DIAGNOSIS — E669 Obesity, unspecified: Secondary | ICD-10-CM

## 2021-05-07 DIAGNOSIS — E870 Hyperosmolality and hypernatremia: Secondary | ICD-10-CM

## 2021-05-07 DIAGNOSIS — I10 Essential (primary) hypertension: Secondary | ICD-10-CM

## 2021-05-07 DIAGNOSIS — J690 Pneumonitis due to inhalation of food and vomit: Secondary | ICD-10-CM

## 2021-05-07 LAB — CBC WITH DIFFERENTIAL/PLATELET
Abs Immature Granulocytes: 0.07 10*3/uL (ref 0.00–0.07)
Basophils Absolute: 0 10*3/uL (ref 0.0–0.1)
Basophils Relative: 0 %
Eosinophils Absolute: 0.1 10*3/uL (ref 0.0–0.5)
Eosinophils Relative: 1 %
HCT: 34.7 % — ABNORMAL LOW (ref 39.0–52.0)
Hemoglobin: 11 g/dL — ABNORMAL LOW (ref 13.0–17.0)
Immature Granulocytes: 1 %
Lymphocytes Relative: 16 %
Lymphs Abs: 1.8 10*3/uL (ref 0.7–4.0)
MCH: 30.4 pg (ref 26.0–34.0)
MCHC: 31.7 g/dL (ref 30.0–36.0)
MCV: 95.9 fL (ref 80.0–100.0)
Monocytes Absolute: 1.5 10*3/uL — ABNORMAL HIGH (ref 0.1–1.0)
Monocytes Relative: 13 %
Neutro Abs: 8 10*3/uL — ABNORMAL HIGH (ref 1.7–7.7)
Neutrophils Relative %: 69 %
Platelets: 178 10*3/uL (ref 150–400)
RBC: 3.62 MIL/uL — ABNORMAL LOW (ref 4.22–5.81)
RDW: 15.4 % (ref 11.5–15.5)
WBC: 11.5 10*3/uL — ABNORMAL HIGH (ref 4.0–10.5)
nRBC: 0 % (ref 0.0–0.2)

## 2021-05-07 LAB — COMPREHENSIVE METABOLIC PANEL
ALT: 23 U/L (ref 0–44)
AST: 28 U/L (ref 15–41)
Albumin: 1.8 g/dL — ABNORMAL LOW (ref 3.5–5.0)
Alkaline Phosphatase: 38 U/L (ref 38–126)
Anion gap: 8 (ref 5–15)
BUN: 27 mg/dL — ABNORMAL HIGH (ref 6–20)
CO2: 29 mmol/L (ref 22–32)
Calcium: 8.2 mg/dL — ABNORMAL LOW (ref 8.9–10.3)
Chloride: 103 mmol/L (ref 98–111)
Creatinine, Ser: 0.8 mg/dL (ref 0.61–1.24)
GFR, Estimated: >60 mL/min (ref >60)
Glucose, Bld: 111 mg/dL — ABNORMAL HIGH (ref 70–99)
Potassium: 3.9 mmol/L (ref 3.5–5.1)
Sodium: 140 mmol/L (ref 135–145)
Total Bilirubin: 0.4 mg/dL (ref 0.3–1.2)
Total Protein: 6.3 g/dL — ABNORMAL LOW (ref 6.5–8.1)

## 2021-05-07 LAB — URINALYSIS, ROUTINE W REFLEX MICROSCOPIC
Bacteria, UA: NONE SEEN
Bilirubin Urine: NEGATIVE
Glucose, UA: NEGATIVE mg/dL
Ketones, ur: 5 mg/dL — AB
Leukocytes,Ua: NEGATIVE
Nitrite: NEGATIVE
Protein, ur: 30 mg/dL — AB
Specific Gravity, Urine: 1.025 (ref 1.005–1.030)
pH: 5 (ref 5.0–8.0)

## 2021-05-07 NOTE — Plan of Care (Signed)
°  Problem: Education: °Goal: Knowledge of General Education information will improve °Description: Including pain rating scale, medication(s)/side effects and non-pharmacologic comfort measures °Outcome: Not Progressing °  °Problem: Clinical Measurements: °Goal: Ability to maintain clinical measurements within normal limits will improve °Outcome: Not Progressing °  °Problem: Clinical Measurements: °Goal: Respiratory complications will improve °Outcome: Not Progressing °  °

## 2021-05-07 NOTE — Progress Notes (Signed)
PROGRESS NOTE    Billy Cherry  WPY:[id number] DOB: 1970-08-03 DOA: 03/30/2021 PCP: Patient, No Pcp Per (Inactive)   Brief Narrative:  51 y/o male with HTN who was found to be unresponsive by his wife. It tooke 5-10 min for EMS to arrive. EMS found him to be in V fib with agonal respirations. Defibrillated once and then went into asystole. Given 2 round of Epinephrineand had ROSC.  Futher history later obtained from wife that the patient had  angioedema however there is no report of this per multiple physician's notes from admission.   EKG after ROSC showed anterolateral ST elevation and he was taken to the cath lab. Dr Josefa Half performed cath> insignificant CAD and EF 45-50%. Patient remained on ventilator after cath and was admitted to the ICU. Underwent TTM. Given Propofol.  1/19- neuro consulted- noted to have rhythmic twitching of his lips/tongue and was started on keppra. MRI brain> hypoxic ischemic injury involving bilateral ACA-MCA watershed cortex, bilat occipital cortex and BG. ECHO showed EF of 20% with global hypokinesis. Treated for VAP.    1/25> showed episodes of eye opening and lip twitching, more prominent when stimulated consistent with myoclonic seizures.  Additionally EEG was suggestive of severe diffuse encephalopathy.  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   1/27- Neuro, Dr Hortense Ramal had [place] conversation with wife who stated that she wanted to continue aggressive care   2/2 palliative care note> wife did not show up for meeting- wife was called  and noted that she "hopes to push out any decisions regarding her husband treatment  until his mother/[name] comes to [place].  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 Received Cefepime.  - IR consulted for PEG tube after speaking with wife. 2/20 Change trach to 6 cuffless    Transition of care team, helping arrange home health.  Spike Fever 2/28, probably aspirate 2/27-respiratory culture growing Pseudomonas-and remains on Fortaz Did spike a 100.7 temp 3/5 02:53.Marland KitchenMarland KitchenLast temp spike previously was 05/04/21 4 pm   Assessment & Plan:   Principal Problem:   Cardiac arrest [facility]) Active Problems:   Anoxic encephalopathy ([name])   Acute respiratory failure ([name])   RLL pneumonia   Dysphagia   Myoclonus   Hypernatremia   HFrEF (heart failure with reduced ejection fraction) ([name])   Sepsis ([name])   HTN (hypertension)   Obesity, Class I, BMI 30-34.9   * Cardiac arrest ([name]) Cardiac arrest  Postarrest anoxic encephalopathy Postarrest respiratory failure s/p TC S/P PEG tube placement Postarrest myoclonus: his EF is less than 20%, now on tracheostomy with trach collar at 5l -SLP following, on IV antibiotics as below, PCCM following less likely reported glottic secretion if decannulated  Cont PEG tube getting bolus tube feeding. Remains obtunded with anoxic encephalopathy-on Keppra, for myoclonus and can increase Klonopin for breakthrough myoclonus Continue frequent positioning supportive care. At this time plan is home with home health-Home health/patient's wife and family will be taking turns to take care of him he will need around-the-clock care.Enteral tube feeding trach kit and supplies have been arranged by TOC and orders signed   Anoxic encephalopathy ([place]) See  above     RLL pneumonia Sepsis secondary to Pseudomonas pneumonia.   Previously had completed vancomycin for MSSA and [name]. He had temperature spike again on 2/20 chest x-ray showed pneumonia respiratory culture with Pseudomonas 2/28- and on ceftazidime- day # 6: Complete at  least 7 days of antibiotics  Continue secretion management and scopolamine, continue frequent suctioning respiratory care. Unfortunately he remains at risk of ongoing respiratory compromise infection bed sore. Fever 3/5 prior on 3/2, ordered blood cx, u/a and cxr 3/5   Acute respiratory failure ([place]) See above    Dysphagia PEG tube in place.  Vomited 2/27-KUB negative for obstruction.  Tolerating tube feeds at this time.    Obesity, Class I, BMI 30-34.9 BMI 33.   HTN (hypertension) Blood pressures poorly controlled-likely contributed to patient's cardiac arrest.  Started on Coreg Isordil amlodipine Cardura hydralazine and PRN Labetalol. Hydralazine dose was decreased 3/1- bp stable now cont meds   HFrEF (heart failure with reduced ejection fraction) ([name]) EF less than 20%, volume status stable, continue coreg, imdur, hydralazine.  Fluid management closely, on tube feeds. Increased Hydralazine 3/01 for elevated BP   Hypernatremia Resolved with adjustment of free water. on free water 400 cc Q 6 hrs for now   Myoclonus Seen by neurology.  Continue current AED with Klonopin, Keppra. per neuro notes, suppressing his myoclonus will not ultimately change the prognosis     DVT prophylaxis: Heparin SQ  Code Status: full    Code Status Orders  (From admission, onward)           Start     Ordered   03/30/21 1633  Full code  Continuous        03/30/21 1634           Code Status History     Date Active Date Inactive Code Status Order ID Comments User Context   03/23/2021 0410 03/30/2021 1625 Full Code [id number]  Isaias Cowman, MD ED      Family Communication: called wife, discussed with her new fever and workup. No additional questions  Disposition Plan:  not medically stable for discharge with new fever, ongoing iv abx, recurrent pna.  Poor prognosis Consults called: None Admission status: Inpatient   Consultants:  Palliative, ent gi,  neuro, cards  pccm  Procedures:  CT ABDOMEN WO CONTRAST  Result Date: 04/14/2021 CLINICAL DATA:  Anatomy for G-tube placement EXAM: CT ABDOMEN WITHOUT CONTRAST TECHNIQUE: Multidetector CT imaging of the abdomen was performed following the standard protocol without IV contrast. RADIATION DOSE REDUCTION: This exam was performed according to the departmental dose-optimization program which includes automated exposure control, adjustment of the mA and/or kV according to patient size and/or use of iterative reconstruction technique. COMPARISON:  None. FINDINGS: Lower chest: Possible trace right pleural effusion. Right lower lobe atelectasis. Mild left lower lobe atelectasis. Hepatobiliary: No focal liver abnormality. No gallstones, gallbladder wall thickening, or pericholecystic fluid. No biliary dilatation. Pancreas: No focal lesion. Normal pancreatic contour. No surrounding inflammatory changes. No main pancreatic ductal dilatation. Spleen: Normal in size without focal abnormality. Adrenals/Urinary Tract: No adrenal nodule bilaterally. No nephrolithiasis and no hydronephrosis. There is a 2.9 cm fluid density lesion within the right kidney that likely represents a simple renal cyst. No ureterolithiasis or hydroureter. The urinary bladder is unremarkable. Stomach/Bowel: Enteric tube with tip in the region of the pyloric lumen. Stomach is within normal limits. No evidence of bowel wall thickening or dilatation. Colonic diverticulosis. Appendix appears normal. Vascular/Lymphatic: No abdominal aorta  aneurysm. Mild atherosclerotic plaque of the aorta and its branches. No abdominal lymphadenopathy. Other: No intraperitoneal free fluid. No intraperitoneal free gas. No organized fluid collection. Musculoskeletal: No abdominal wall hernia or abnormality. No suspicious lytic or blastic osseous lesions. No acute displaced fracture. Multilevel degenerative changes of the spine. IMPRESSION: 1. Enteric tube at the level of the pylorus. 2.  Colonic diverticulosis with no acute diverticulitis. 3. Query trace right pleural effusion with associated right lower lobe opacity that likely represents atelectasis. Superimposed infection/inflammation not excluded. 4. Aortic Atherosclerosis (ICD10-I70.0). 5. Otherwise limited evaluation of the abdomen on this noncontrast study. Electronically Signed   By: Tish Frederickson M.D.   On: 04/14/2021 17:32   DG Chest 1 View  Result Date: 04/13/2021 CLINICAL DATA:  ET tube present, post cardiac arrest EXAM: CHEST  1 VIEW COMPARISON:  Radiograph 04/10/2021 FINDINGS: Endotracheal tube tip overlies the midthoracic trachea. Unchanged, enlarged cardiac silhouette. Unchanged faint right lower lung opacity. No pleural effusion. No pneumothorax. No acute osseous abnormality. IMPRESSION: Endotracheal tube tip overlies the midthoracic trachea. Unchanged faint right lower lung opacity, possibly atelectasis or developing infection. Electronically Signed   By: Caprice Renshaw M.D.   On: 04/13/2021 08:17   DG Abd 1 View  Result Date: 05/01/2021 CLINICAL DATA:  Vomiting. EXAM: ABDOMEN - 1 VIEW COMPARISON:  None. FINDINGS: A PEG tube projects over the mid abdomen, likely in the region of the gastric body. Limited views of the lung bases are normal. No evidence of bowel obstruction identified. No other acute abnormalities. IMPRESSION: 1. A PEG tube projects over the mid abdomen, likely in the region of the gastric body. 2. No obstruction.  No other abnormalities. Electronically Signed   By: Gerome Sam III M.D.   On: 05/01/2021 11:56   IR GASTROSTOMY TUBE MOD SED  Result Date: 04/26/2021 INDICATION: Hypoxic brain injury EXAM: Ultrasound and fluoroscopy guided percutaneous gastrostomy tube placement MEDICATIONS: Ancef 2 g IV; Antibiotics were administered within 1 hour of the procedure. Glucagon 1 mg IV ANESTHESIA/SEDATION: None CONTRAST:  15 mL Omnipaque 300-administered into the gastric lumen. FLUOROSCOPY: Radiation Exposure  Index (as provided by the fluoroscopic device): 57 mGy Kerma COMPLICATIONS: None immediate. PROCEDURE: Informed written consent was obtained from the patient's wife after a thorough discussion of the procedural risks, benefits and alternatives. All questions were addressed. Maximal Sterile Barrier Technique was utilized including caps, mask, sterile gowns, sterile gloves, sterile drape, hand hygiene and skin antiseptic. A timeout was performed prior to the initiation of the procedure. An orogastric tube was placed with fluoroscopic guidance. The anterior abdomen was prepped and draped in sterile fashion. Ultrasound evaluation of the left upper quadrant was performed to confirm the position of the liver. The skin and subcutaneous tissues were anesthetized with 1% lidocaine. 17 gauge needle was directed into the distended stomach with fluoroscopic guidance. A wire was advanced into the stomach. 9-French vascular sheath was placed and the orogastric tube was snared using a Gooseneck snare device. The orogastric tube and snare were pulled out of the patient's mouth. The snare device was connected to a 20-French gastrostomy tube. The snare device and gastrostomy tube were pulled through the patient's mouth and out the anterior abdominal wall. The gastrostomy tube was cut to an appropriate length. Contrast injection through gastrostomy tube confirmed placement within the stomach. Fluoroscopic images were obtained for documentation. The gastrostomy tube was flushed with normal saline. IMPRESSION: 20 pull-through percutaneous gastrostomy placement as above. Electronically Signed   By: Harrington Challenger.D.  On: 04/26/2021 13:53   DG CHEST PORT 1 VIEW  Result Date: 05/02/2021 CLINICAL DATA:  Fever, tracheostomy EXAM: PORTABLE CHEST 1 VIEW COMPARISON:  Portable exam 0739 hours compared to 04/20/2021 FINDINGS: Tracheostomy tube projects over tracheal air column. Enlargement of cardiac silhouette. Mediastinal contours and  pulmonary vascularity normal. Bibasilar atelectasis versus consolidation. Upper lungs clear. No definite pleural effusion or pneumothorax. IMPRESSION: Bibasilar atelectasis versus consolidation. Enlargement of cardiac silhouette. Electronically Signed   By: Lavonia Dana M.D.   On: 05/02/2021 07:57   DG Chest Port 1 View  Result Date: 04/20/2021 CLINICAL DATA:  Sepsis, tracheostomy tube EXAM: PORTABLE CHEST 1 VIEW COMPARISON:  Chest radiograph 04/15/2021 FINDINGS: Tracheostomy tube is in stable position. The enteric catheter courses below the diaphragm and off the field of view. The heart is enlarged, unchanged. The mediastinal contours are stable. There is improved aeration of the left lower lobe since the study from 04/15/2021. Patchy opacities in the right lower lobe are slightly worsened since the prior study. There is a suspected trace right pleural effusion. There is no significant left effusion. There is no appreciable pneumothorax. The bones are stable. IMPRESSION: 1. Improved aeration of the left lower lobe since 04/15/2021. 2. Mild patchy opacities in the right base have slightly worsened since the prior study, with a suspected trace right pleural effusion. Electronically Signed   By: Valetta Mole M.D.   On: 04/20/2021 08:11   DG Chest Port 1 View  Result Date: 04/15/2021 CLINICAL DATA:  Status post tracheostomy. EXAM: PORTABLE CHEST 1 VIEW COMPARISON:  04/13/2021 FINDINGS: Interval tracheostomy. Tracheostomy tube tip in satisfactory position above the carina. There is a feeding tube with tip coursing below the level of the GE junction. Stable cardiomediastinal contours. Interval complete atelectasis/consolidation of the entire left lower lobe. New from 04/13/2021. IMPRESSION: 1. Satisfactory position of tracheostomy tube. 2. Interval complete atelectasis/consolidation of the entire left lower lobe. Electronically Signed   By: Kerby Moors M.D.   On: 04/15/2021 15:29   DG CHEST PORT 1  VIEW  Result Date: 04/10/2021 CLINICAL DATA:  Fever, ETT EXAM: PORTABLE CHEST 1 VIEW COMPARISON:  April 01, 2021 FINDINGS: Endotracheal tube projects over the upper thoracic trachea approximately 9 cm from the carina. Enteric tube courses below the diaphragm with tip obscured by collimation. Borderline cardiac enlargement similar prior and accentuated by technique. New hazy right basilar airspace opacity. The visualized skeletal structures are unchanged. IMPRESSION: 1. Endotracheal tube projects over the upper thoracic trachea approximately 9 cm from the carina. 2. New hazy right basilar airspace opacity, may reflect atelectasis or pneumonia. Electronically Signed   By: Dahlia Bailiff M.D.   On: 04/10/2021 09:21    Antimicrobials:  Ceftazidime     Subjective: Obtunded, anoxic brain I jury  Objective: Vitals:   05/07/21 0243 05/07/21 0304 05/07/21 0603 05/07/21 1119  BP: 132/79  (!) 150/84 (!) 133/96  Pulse: 91   90  Resp: $Remo'18 16  18  'bmnAG$ Temp: (!) 100.7 F (38.2 C)   99.7 F (37.6 C)  TempSrc: Axillary   Axillary  SpO2: 98%   100%  Height:        Intake/Output Summary (Last 24 hours) at 05/07/2021 1131 Last data filed at 05/07/2021 0248 Gross per 24 hour  Intake --  Output 700 ml  Net -700 ml   Filed Weights    Examination:  General exam: Obtunded unresponsive, no changes HEENT:Oral mucosa moist, Ear/Nose WNL grossly, dentition normal. Respiratory system: bilaterally diminished, air entry present, on  trach collar, no use of accessory muscle Cardiovascular system: S1 & S2 +, No JVD,. Gastrointestinal system: Abdomen soft,NT,ND, PEG present BS+ Nervous System: Obtunded and unresponsive Extremities: edema neg,distal peripheral pulses palpable.  Skin: No rashes,no icterus. MSK: Normal muscle bulk,tone, power     Data Reviewed: I have personally reviewed following labs and imaging studies  CBC: Recent Labs  Lab 04/30/21 1218 05/02/21 0806 05/03/21 0939 05/07/21 1101  WBC  11.5* 13.8* 10.6* 11.5*  NEUTROABS  --   --   --  8.0*  HGB 11.4* 12.5* 12.9* 11.0*  HCT 36.3* 37.8* 38.9* 34.7*  MCV 95.5 93.8 93.7 95.9  PLT 191 196 194 378   Basic Metabolic Panel: Recent Labs  Lab 04/30/21 1218 05/02/21 0806  NA 138 139  K 4.3 4.1  CL 99 101  CO2 31 30  GLUCOSE 105* 113*  BUN 29* 27*  CREATININE 0.84 0.82  CALCIUM 8.5* 8.5*   GFR: Estimated Creatinine Clearance: 131.6 mL/min (by C-G formula based on SCr of 0.82 mg/dL). Liver Function Tests: No results for input(s): AST, ALT, ALKPHOS, BILITOT, PROT, ALBUMIN in the last 168 hours. No results for input(s): LIPASE, AMYLASE in the last 168 hours. No results for input(s): AMMONIA in the last 168 hours. Coagulation Profile: No results for input(s): INR, PROTIME in the last 168 hours. Cardiac Enzymes: No results for input(s): CKTOTAL, CKMB, CKMBINDEX, TROPONINI in the last 168 hours. BNP (last 3 results) No results for input(s): PROBNP in the last 8760 hours. HbA1C: No results for input(s): HGBA1C in the last 72 hours. CBG: No results for input(s): GLUCAP in the last 168 hours. Lipid Profile: No results for input(s): CHOL, HDL, LDLCALC, TRIG, CHOLHDL, LDLDIRECT in the last 72 hours. Thyroid Function Tests: No results for input(s): TSH, T4TOTAL, FREET4, T3FREE, THYROIDAB in the last 72 hours. Anemia Panel: No results for input(s): VITAMINB12, FOLATE, FERRITIN, TIBC, IRON, RETICCTPCT in the last 72 hours. Sepsis Labs: Recent Labs  Lab 05/02/21 0853 05/02/21 1140  LATICACIDVEN 1.6 1.5    Recent Results (from the past 240 hour(s))  Culture, Respiratory w Gram Stain     Status: None   Collection Time: 05/02/21  7:13 AM   Specimen: Tracheal Aspirate; Respiratory  Result Value Ref Range Status   Specimen Description TRACHEAL ASPIRATE  Final   Special Requests NONE  Final   Gram Stain   Final    MODERATE SQUAMOUS EPITHELIAL CELLS PRESENT ABUNDANT WBC PRESENT, PREDOMINANTLY PMN FEW GRAM NEGATIVE  RODS MODERATE GRAM POSITIVE COCCI    Culture   Final    FEW PSEUDOMONAS AERUGINOSA WITHIN MIXED NORMAL RESPIRATORY FLORA Performed at [facility], [place] [address] [place], [place] [address]    Report Status 05/05/2021 FINAL  Final   Organism ID, Bacteria PSEUDOMONAS AERUGINOSA  Final      Susceptibility   Pseudomonas aeruginosa - MIC*    CEFTAZIDIME 4 SENSITIVE Sensitive     CIPROFLOXACIN <=0.25 SENSITIVE Sensitive     GENTAMICIN <=1 SENSITIVE Sensitive     IMIPENEM 2 SENSITIVE Sensitive     PIP/TAZO 8 SENSITIVE Sensitive     CEFEPIME 2 SENSITIVE Sensitive     * FEW PSEUDOMONAS AERUGINOSA         Radiology Studies: No results found.      Scheduled Meds:  amLODipine  10 mg Per Tube Daily   atropine  2 drop Sublingual QID   carvedilol  25 mg Per Tube BID   chlorhexidine gluconate (MEDLINE KIT)  15  mL Mouth Rinse BID   clonazepam  0.5 mg Per Tube TID   doxazosin  2 mg Per Tube Daily   feeding supplement (JEVITY 1.5 CAL/FIBER)  355 mL Per Tube TID WC   feeding supplement (PROSource TF)  45 mL Per Tube BID   fiber  1 packet Per Tube BID   free water  400 mL Per Tube Q6H   Gerhardt's butt cream   Topical Daily   heparin  5,000 Units Subcutaneous Q8H   hydrALAZINE  75 mg Per Tube Q8H   isosorbide dinitrate  20 mg Per Tube TID   levETIRAcetam  750 mg Per Tube BID   mouth rinse  15 mL Mouth Rinse q12n4p   pantoprazole sodium  40 mg Per Tube QHS   scopolamine  1 patch Transdermal Q72H   valproic acid  750 mg Per Tube TID   Continuous Infusions:  cefTAZidime (FORTAZ)  IV 2 g (05/07/21 1004)     LOS: 38 days    Time spent: 35 min    Nicolette Bang, MD [facility]  If 7PM-7AM, please contact night-coverage  05/07/2021, 11:31 AM

## 2021-05-08 DIAGNOSIS — Z930 Tracheostomy status: Secondary | ICD-10-CM

## 2021-05-08 DIAGNOSIS — R509 Fever, unspecified: Secondary | ICD-10-CM

## 2021-05-08 DIAGNOSIS — A419 Sepsis, unspecified organism: Secondary | ICD-10-CM

## 2021-05-08 DIAGNOSIS — J151 Pneumonia due to Pseudomonas: Secondary | ICD-10-CM

## 2021-05-08 DIAGNOSIS — R7881 Bacteremia: Secondary | ICD-10-CM

## 2021-05-08 DIAGNOSIS — I5020 Unspecified systolic (congestive) heart failure: Secondary | ICD-10-CM

## 2021-05-08 LAB — CBC WITH DIFFERENTIAL/PLATELET
Abs Immature Granulocytes: 0.06 10*3/uL (ref 0.00–0.07)
Basophils Absolute: 0 10*3/uL (ref 0.0–0.1)
Basophils Relative: 0 %
Eosinophils Absolute: 0.1 10*3/uL (ref 0.0–0.5)
Eosinophils Relative: 1 %
HCT: 37.4 % — ABNORMAL LOW (ref 39.0–52.0)
Hemoglobin: 12.5 g/dL — ABNORMAL LOW (ref 13.0–17.0)
Immature Granulocytes: 1 %
Lymphocytes Relative: 13 %
Lymphs Abs: 1.6 10*3/uL (ref 0.7–4.0)
MCH: 31.5 pg (ref 26.0–34.0)
MCHC: 33.4 g/dL (ref 30.0–36.0)
MCV: 94.2 fL (ref 80.0–100.0)
Monocytes Absolute: 1.3 10*3/uL — ABNORMAL HIGH (ref 0.1–1.0)
Monocytes Relative: 10 %
Neutro Abs: 9.4 10*3/uL — ABNORMAL HIGH (ref 1.7–7.7)
Neutrophils Relative %: 75 %
Platelets: 174 10*3/uL (ref 150–400)
RBC: 3.97 MIL/uL — ABNORMAL LOW (ref 4.22–5.81)
RDW: 15.3 % (ref 11.5–15.5)
WBC: 12.5 10*3/uL — ABNORMAL HIGH (ref 4.0–10.5)
nRBC: 0 % (ref 0.0–0.2)

## 2021-05-08 LAB — COMPREHENSIVE METABOLIC PANEL
ALT: 22 U/L (ref 0–44)
AST: 29 U/L (ref 15–41)
Albumin: 1.8 g/dL — ABNORMAL LOW (ref 3.5–5.0)
Alkaline Phosphatase: 45 U/L (ref 38–126)
Anion gap: 9 (ref 5–15)
BUN: 26 mg/dL — ABNORMAL HIGH (ref 6–20)
CO2: 30 mmol/L (ref 22–32)
Calcium: 8.4 mg/dL — ABNORMAL LOW (ref 8.9–10.3)
Chloride: 101 mmol/L (ref 98–111)
Creatinine, Ser: 0.71 mg/dL (ref 0.61–1.24)
GFR, Estimated: >60 mL/min (ref >60)
Glucose, Bld: 102 mg/dL — ABNORMAL HIGH (ref 70–99)
Potassium: 4.1 mmol/L (ref 3.5–5.1)
Sodium: 140 mmol/L (ref 135–145)
Total Bilirubin: 0.5 mg/dL (ref 0.3–1.2)
Total Protein: 6.6 g/dL (ref 6.5–8.1)

## 2021-05-08 LAB — BLOOD CULTURE ID PANEL (REFLEXED) - BCID2

## 2021-05-08 MED ORDER — HYDRALAZINE HCL 50 MG PO TABS
75.0000 mg | ORAL_TABLET | Freq: Three times a day (TID) | ORAL | Status: DC
  Administered 2021-05-08 – 2021-05-13 (×16): 75 mg
  Filled 2021-05-08 (×16): qty 1

## 2021-05-08 MED ORDER — VANCOMYCIN HCL 1250 MG/250ML IV SOLN
1250.0000 mg | Freq: Two times a day (BID) | INTRAVENOUS | Status: DC
  Administered 2021-05-09: 1250 mg via INTRAVENOUS
  Filled 2021-05-08: qty 250

## 2021-05-08 MED ORDER — VANCOMYCIN HCL 2000 MG/400ML IV SOLN
2000.0000 mg | Freq: Once | INTRAVENOUS | Status: AC
  Administered 2021-05-08: 2000 mg via INTRAVENOUS
  Filled 2021-05-08: qty 400

## 2021-05-08 NOTE — Progress Notes (Signed)
?  ?  Durable Medical Equipment  ?(From admission, onward)  ?  ? ? ?  ? ?  Start     Ordered  ? 05/08/21 1344  For home use only DME Hospital bed  Once       ?Comments: Please call wife after 6 pm to coordinate delivery.  ?Question Answer Comment  ?Length of Need Lifetime   ?Patient has (list medical condition): anoxic brain injury, cardiace arest   ?The above medical condition requires: Patient requires the ability to reposition frequently   ?Head must be elevated greater than: 30 degrees   ?Bed type Semi-electric   ?Hoyer Lift Yes   ?Support Surface: Gel Overlay   ?  ? 05/08/21 1346  ? ?  ?  ? ?  ?  ?

## 2021-05-08 NOTE — Progress Notes (Addendum)
PROGRESS NOTE    Billy Cherry  ONG:[id number] DOB: 1970-08-27 DOA: 03/30/2021 PCP: Patient, Billy Cherry (Inactive)    Brief Narrative:  51 years old male with past medical history of hypertension was brought into the hospital after being found unresponsive by his wife.  EMS was called in and patient was noted to be in ventricular fibrillation with agonal respirations.  Patient was given CPR, epinephrine and had return of spontaneous circulation.  EKG showed anterolateral ST elevation and was taken to Cath Lab. Dr Josefa Half performed cath> insignificant CAD and EF 45-50%. Patient remained on ventilator after cath and was admitted to the ICU. Underwent TTM.  Neurology was consulted due to rhythmic twitching of his lips tongue and was started on Keppra.  MRI of the brain showed evidence of hypoxia with bilateral ACA MCA watershed infarcts.  2D echocardiogram showed LV ejection fraction of 20% with global hypokinesis.  Patient was also treated for ventilator associated pneumonia.  At this time, patient has undergone tracheostomy and PEG tube placement.  Patient did have a spike of fever on 2/28 thought to be secondary to aspiration and is currently on Fortaz since respiratory culture was growing Pseudomonas.  Current plan for home care by the family.     Assessment and Plan: * Cardiac arrest ([place]) Postarrest anoxic encephalopathy Postarrest respiratory failure s/p TC S/P PEG tube placement Postarrest myoclonus: 2D echocardiogram showed reduced LV function at 20%.  Continue tracheostomy with trach collar.  Continue PEG tube feeding.  Remains obtunded with anoxic encephalopathy-on Keppra, for myoclonus and can increase Klonopin for breakthrough myoclonus.  Continue supportive care.  At this time plan is home with home health-Home health/patient's wife and family will be taking turns to take care of him he will need around-the-clock care.Enteral tube feeding trach kit and supplies have been arranged by  TOC and orders signed  Anoxic encephalopathy ([place]) Continue supportive care.  RLL pneumonia Sepsis secondary to Pseudomonas pneumonia.   Previously had completed vancomycin for MSSA and [name].  On IV Fortaz at this time and is 7-day  antibiotic today.  Continue respiratory care scopolamine.  Patient is at a very high risk of recurrent pneumonia.  Patient still has temperature max of 100.4 F.  WBC at 12.5  Acute respiratory failure ([name]) Status post trach collar.  Continue supportive care  Dysphagia PEG tube in place.  X-ray KUB negative for obstruction.  Tolerating tube feeds  Fever Spikes of fever despite being on Fortaz.  Patient did have sputum culture showing Pseudomonas.  Blood cultures from yesterday showed staph species-staph epidermidis in 3 out of 4 bottles.  We will add IV vancomycin.  Obesity, Class I, BMI 30-34.9 BMI 33.  HTN (hypertension) Continue Coreg Isordil amlodipine Cardura hydralazine and PRN Labetalol.  HFrEF (heart failure with reduced ejection fraction) ([name]) 2D echocardiogram swelling LVEF less than 20%, continue coreg, imdur, amlodipine, doxazosin, hydralazine, labetalol as needed  Hypernatremia Continue free water 400 cc Q 6 hrs for now, latest sodium of 140.  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  Continue current AED with Klonopin, Keppra. Cherry neuro notes, suppressing his myoclonus will not ultimately change the prognosis   Nutrition through the PEG tube.  Continue water flushes.  DVT prophylaxis: heparin injection 5,000 Units Start: 03/30/21 2000   Code Status:     Code Status: Full Code  Disposition: Home versus long-term care  Status is: Inpatient Remains inpatient appropriate because: Pending clinical improvement, persistent fever, IV antibiotic   Family Communication: None  Consultants:  Palliative, ENT, GI, neuro, cardiology, PCCM  Procedures:  IR guided gastrostomy tube placement on 04/26/2021 Endotracheal tube  placement mechanical ventilation. Tracheostomy   Antimicrobials:  Tressie Ellis 05/02/2021>  Anti-infectives (From admission, onward)    Start     Dose/Rate Route Frequency Ordered Stop   05/02/21 1000  cefTAZidime (FORTAZ) 2 g in sodium chloride 0.9 % 100 mL IVPB        2 g 200 mL/hr over 30 Minutes Intravenous Every 8 hours 05/02/21 0846 05/12/21 0959   04/26/21 0756  ceFAZolin (ANCEF) 2-4 GM/100ML-% IVPB  Status:  Discontinued       Note to Pharmacy: [name] L: cabinet override      04/26/21 0756 04/26/21 0855   04/26/21 0600  ceFAZolin (ANCEF) IVPB 2g/100 mL premix        2 g 200 mL/hr over 30 Minutes Intravenous Every 8 hours 04/25/21 1131 04/26/21 0915   04/23/21 1400  cefTAZidime (FORTAZ) 1 g in sodium chloride 0.9 % 100 mL IVPB        1 g 200 mL/hr over 30 Minutes Intravenous Every 8 hours 04/23/21 1256 04/25/21 2331   04/20/21 1300  ceFEPIme (MAXIPIME) 2 g in sodium chloride 0.9 % 100 mL IVPB  Status:  Discontinued        2 g 200 mL/hr over 30 Minutes Intravenous Every 8 hours 04/20/21 1205 04/23/21 1241   04/14/21 0930  vancomycin (VANCOREADY) IVPB 1250 mg/250 mL        1,250 mg 166.7 mL/hr over 90 Minutes Intravenous Every 24 hours 04/13/21 0902 04/19/21 1247   04/13/21 0930  vancomycin (VANCOREADY) IVPB 2000 mg/400 mL        2,000 mg 200 mL/hr over 120 Minutes Intravenous  Once 04/13/21 0831 04/13/21 1158   04/12/21 0815  ceFEPIme (MAXIPIME) 2 g in sodium chloride 0.9 % 100 mL IVPB  Status:  Discontinued        2 g 200 mL/hr over 30 Minutes Intravenous Every 8 hours 04/12/21 0724 04/13/21 0829   04/06/21 1800  ceFAZolin (ANCEF) IVPB 2g/100 mL premix        2 g 200 mL/hr over 30 Minutes Intravenous Every 8 hours 04/06/21 1403 04/10/21 1913   04/04/21 1000  ceFEPIme (MAXIPIME) 2 g in sodium chloride 0.9 % 100 mL IVPB  Status:  Discontinued        2 g 200 mL/hr over 30 Minutes Intravenous Every 8 hours 04/04/21 0919 04/06/21 1403       Subjective: Today, patient was  seen and examined at bedside.  Patient is nonverbal.  Temperature max noted at 100.4 F.  Objective: Vitals:   05/08/21 0900 05/08/21 1000 05/08/21 1206 05/08/21 1523  BP: 134/84 126/89    Pulse: 87 82 89 88  Resp: (!) 24 (!) 23 (!) 21 (!) 22  Temp: 99.1 F (37.3 C) 99.1 F (37.3 C)    TempSrc: Oral Oral    SpO2: 96% 100% 99% 99%  Height:        Intake/Output Summary (Last 24 hours) at 05/08/2021 1616 Last data filed at 05/08/2021 0600 Gross Cherry 24 hour  Intake 1155 ml  Output 900 ml  Net 255 ml   Filed Weights    Physical Examination:  General:  Average built, not in obvious distress, nonverbal, minimally responsive HENT:   Billy scleral pallor or icterus noted. Oral mucosa is moist.  Tracheostomy collar in place. Chest:    Diminished breath sounds bilaterally. Billy crackles or wheezes.  CVS: S1 &S2 heard. Billy murmur.  Regular rate and rhythm. Abdomen: Soft, nontender, nondistended.  Bowel sounds are heard.  PEG tube in place. Extremities: Billy cyanosis, clubbing or edema.  Peripheral pulses are palpable. Psych: Nonverbal, minimally responsive, CNS: Minimally responsive, nonverbal, flaccid extremities Skin: Warm and dry.   Data Reviewed:   CBC: Recent Labs  Lab 05/02/21 0806 05/03/21 0939 05/07/21 1101 05/08/21 0426  WBC 13.8* 10.6* 11.5* 12.5*  NEUTROABS  --   --  8.0* 9.4*  HGB 12.5* 12.9* 11.0* 12.5*  HCT 37.8* 38.9* 34.7* 37.4*  MCV 93.8 93.7 95.9 94.2  PLT 196 194 178 575    Basic Metabolic Panel: Recent Labs  Lab 05/02/21 0806 05/07/21 1101 05/08/21 0426  NA 139 140 140  K 4.1 3.9 4.1  CL 101 103 101  CO2 $Re'30 29 30  'dYK$ GLUCOSE 113* 111* 102*  BUN 27* 27* 26*  CREATININE 0.82 0.80 0.71  CALCIUM 8.5* 8.2* 8.4*    Liver Function Tests: Recent Labs  Lab 05/07/21 1101 05/08/21 0426  AST 28 29  ALT 23 22  ALKPHOS 38 45  BILITOT 0.4 0.5  PROT 6.3* 6.6  ALBUMIN 1.8* 1.8*     Radiology Studies: DG CHEST PORT 1 VIEW  Result Date: 05/07/2021 CLINICAL  DATA:  Fever, pleural effusion EXAM: PORTABLE CHEST 1 VIEW COMPARISON:  05/02/2021 FINDINGS: Tracheostomy tube in satisfactory position. Mild bilateral interstitial thickening. Billy focal consolidation. Billy pleural effusion or pneumothorax. Stable cardiomegaly. Billy acute osseous abnormality. IMPRESSION: 1. Billy acute cardiopulmonary disease. Electronically Signed   By: Kathreen Devoid M.D.   On: 05/07/2021 12:36      LOS: 39 days    Flora Lipps, MD [facility] 05/08/2021, 4:16 PM

## 2021-05-08 NOTE — Consult Note (Addendum)
WOC Nurse Consult Note: ?Reason for Consult:Moisture associated skin damage (MASD), now called irritant contact dermatitis related to urinary and fecal incontinence. ?Wound type:moisture ?Pressure Injury POA: Yes, healed Stage 3 PI ?Measurement: 6cm x 4.5cm with reepithelialization and return of pigment to healed areas ?Wound bed:N/A ?Drainage (amount, consistency, odor) none ?Periwound: intact, macerated ?Dressing procedure/placement/frequency:Patient is on a mattress replacement with low air loss feature and is being turned and repositioned every 2 hours. He is incontinent of large amounts of loose stool and he is promptly cleansed with house skin care products.  Topical applications of Gerhart's Butt cream (1:1:1 hydrocortisone, lotrimin and zinc oxide) follow each episode of soiling. Urinary incontinence is being managed with an external male catheter. ?A silicone foam is over the sacrum, which is intact. The tip of the dressing is to be oriented away from the anus. ?Bilateral orthopedic boots are in place. ?I have provided Nursing with guidance to continue with the POC. ? ?[place] nursing team will not follow, but will remain available to this patient, the nursing and medical teams.  Please re-consult if needed. ?Thanks, ?Maudie Flakes, MSN, RN, [place], [place], CWON-AP, [place]  ?Pager# [phone]  ? ? ? ?  ?

## 2021-05-08 NOTE — Progress Notes (Signed)
PHARMACY - PHYSICIAN COMMUNICATION ?CRITICAL VALUE ALERT - BLOOD CULTURE IDENTIFICATION (BCID) ? ?Billy Cherry is an 51 y.o. male who presented to [facility] on 03/30/2021 with a chief complaint of sepsis 2/2 PsA pneumonia. ? ?Assessment:  1/4 staph epi with MecA resistance - likely contaminant ? ?Name of physician (or Provider) Contacted: Dr. Tyson Babinski ? ?Current antibiotics: Ceftazidime 2g IV every 8 hours ? ?Changes to prescribed antibiotics recommended:  ?Will continue Cefazidime for PsA Pneumonia and recent fevers through 3/10. ? ?Results for orders placed or performed during the hospital encounter of 03/30/21  ?Blood Culture ID Panel (Reflexed) (Collected: 05/07/2021 12:30 PM)  ?Result Value Ref Range  ? Enterococcus faecalis NOT DETECTED NOT DETECTED  ? Enterococcus Faecium NOT DETECTED NOT DETECTED  ? Listeria monocytogenes NOT DETECTED NOT DETECTED  ? Staphylococcus species DETECTED (A) NOT DETECTED  ? Staphylococcus aureus (BCID) NOT DETECTED NOT DETECTED  ? Staphylococcus epidermidis DETECTED (A) NOT DETECTED  ? Staphylococcus lugdunensis NOT DETECTED NOT DETECTED  ? Streptococcus species NOT DETECTED NOT DETECTED  ? Streptococcus agalactiae NOT DETECTED NOT DETECTED  ? Streptococcus pneumoniae NOT DETECTED NOT DETECTED  ? Streptococcus pyogenes NOT DETECTED NOT DETECTED  ? A.calcoaceticus-baumannii NOT DETECTED NOT DETECTED  ? Bacteroides fragilis NOT DETECTED NOT DETECTED  ? Enterobacterales NOT DETECTED NOT DETECTED  ? Enterobacter cloacae complex NOT DETECTED NOT DETECTED  ? Escherichia coli NOT DETECTED NOT DETECTED  ? Klebsiella aerogenes NOT DETECTED NOT DETECTED  ? Klebsiella oxytoca NOT DETECTED NOT DETECTED  ? Klebsiella pneumoniae NOT DETECTED NOT DETECTED  ? Proteus species NOT DETECTED NOT DETECTED  ? Salmonella species NOT DETECTED NOT DETECTED  ? Serratia marcescens NOT DETECTED NOT DETECTED  ? Haemophilus influenzae NOT DETECTED NOT DETECTED  ? Neisseria meningitidis NOT DETECTED NOT  DETECTED  ? Pseudomonas aeruginosa NOT DETECTED NOT DETECTED  ? Stenotrophomonas maltophilia NOT DETECTED NOT DETECTED  ? Candida albicans NOT DETECTED NOT DETECTED  ? Candida auris NOT DETECTED NOT DETECTED  ? Candida glabrata NOT DETECTED NOT DETECTED  ? Candida krusei NOT DETECTED NOT DETECTED  ? Candida parapsilosis NOT DETECTED NOT DETECTED  ? Candida tropicalis NOT DETECTED NOT DETECTED  ? Cryptococcus neoformans/gattii NOT DETECTED NOT DETECTED  ? Methicillin resistance mecA/C DETECTED (A) NOT DETECTED  ? ? ?Filbert Schilder, PharmD ?PGY1 Pharmacy Resident ?05/08/2021  2:30 PM ? ?Please check [email or web address] for unit-specific pharmacy phone numbers. ? ? ?

## 2021-05-08 NOTE — Progress Notes (Signed)
Spoke with Mindy, RT in regards to wife now at bedside for trach teaching; wife agreeable. ?

## 2021-05-08 NOTE — Progress Notes (Signed)
Pharmacy Antibiotic Note ? ?Billy Cherry is a 51 y.o. male admitted on 03/30/2021 with bacteremia.  Pharmacy has been consulted for vanc dosing. ? ?Pt has been on ceftazidime for pseudomonas PNA. Earlier today BCID reported as 1/3 bottles for staph epi. Now, it looks like 3/3 bottles are positive. It's an MRSE. D/w Dr. Tyson Babinski and we will add vanc.  ? ?Plan: ?Vanc 2g IV x1 then 1.25g IV q12>>AUC 476, scr 0.8 ? ?Height:  (bed scale not working) ?Weight:  (bed scale not working) ?IBW/kg (Calculated) : 73 ? ?Temp (24hrs), Avg:99.7 ?F (37.6 ?C), Min:99.1 ?F (37.3 ?C), Max:100.4 ?F (38 ?C) ? ?Recent Labs  ?Lab 05/02/21 ?0806 05/02/21 ?0853 05/02/21 ?1140 05/03/21 ?9629 05/07/21 ?1101 05/08/21 ?0426  ?WBC 13.8*  --   --  10.6* 11.5* 12.5*  ?CREATININE 0.82  --   --   --  0.80 0.71  ?LATICACIDVEN  --  1.6 1.5  --   --   --   ?  ?Estimated Creatinine Clearance: 134.8 mL/min (by C-G formula based on SCr of 0.71 mg/dL).   ? ?Allergies  ?Allergen Reactions  ? Lisinopril Swelling  ?  angioedema  ? ? ?Antimicrobials this admission: ?CTX 1/21 >> 1/25 ?Cefepime 1/31 >> 2/2; 2/8>2/9; 2/16 >> 2/19 ?Ancef 2/2 >> 2/6 ?Vanc 2/9 > 2/15 3/6>> ?Elita Quick 2/19>2/21, 2/28>> ?  ?Dose adjustments this admission: ? ? ?Microbiology results: ?1/31 TA - MSSA and Corynebacterium  ?2/6 TA - MSSA and flavobacterium  ?2/6 BCx - negative ?2/16 BCx - neg ?2/16 TA - abundant Pseu (pan sensitive) ?3/5 blood>>3/3 bottles MRSE ? ?Ulyses Southward, PharmD, BCIDP, AAHIVP, CPP ?Infectious Disease Pharmacist ?05/08/2021 4:56 PM ? ? ? ?

## 2021-05-08 NOTE — Assessment & Plan Note (Signed)
Spikes of fever despite being on Fortaz.  Patient did have sputum culture showing Pseudomonas.  Blood cultures from yesterday showed staph species-staph epidermidis in 3 out of 4 bottles.  We will add IV vancomycin. ?

## 2021-05-08 NOTE — Progress Notes (Signed)
?NAME:  Gerasimos Plotts, MRN:  [id number], DOB:  [birth year], LOS: 39 ?ADMISSION DATE:  03/30/2021, CONSULTATION DATE:  1/19 ?REFERRING MD:  Parash[place], CHIEF COMPLAINT:  Found down, cardiac arrest  ? ?Brief Pt Description / Synopsis:  ?51 year old male with out-of-hospital V. fib cardiac arrest in setting of respiratory arrest due to suspected angioedema from ACE inhibitor leading to asphyxiation.  Concern on his 12 lead EKG for anterolateral ST elevation so he was taken to the cath lab where his study showed no significant coronary artery disease and an LVEF < 20%  Now with anoxic brain injury. ? ?Pertinent  Medical History  ?Hypertension ? ?Significant Hospital Events: ?Including procedures, antibiotic start and stop dates in addition to other pertinent events   ?1/19 admission, left heart cath without significant CAD.  Concern for anoxic brain injury ?1/20: MRI pending, Neuro following ?1/21: MRI shows areas of hypoxic-ischemic injury bilaterally. Fevered; cultures obtained. ?1/23 severe brain damage ?1/24 evidence of severe brain damage, remains critically ill ?1/25: Plan for PEG today. Neuro exam unchanged.  Does exhibit new eyelid tremor like movements and lip/jaw tremor like vs clonus movement.  Bolused Keppra and increased maintenance dose.  Repeat EEG. CTH: concerning for progression of anoxic injury.  ?1/26: Repeat EEG unchanged and continues to show myoclonic seizures despite addition of Valproic acid yesterday. transfer to [facility] for continuous EEG ?1/28: Continues to have myoclonic sz on EEG ?1/29 No acute issues overnight ?2/3 no meaningful neuro recovery. Diuresed ?2/5 unchanged ?2/6 New fever to 102, WBC 16K - treated with vancomycin for 7d based on cultures.  ?2/11: percutaneous tracheostomy ?2/13 >2/20 on trach collar. No vent.  ?2/20 Trach change to 6 cuffless ?2/27 status unchanged ? ?Interim History / Subjective:  ?Tmax 100.4, remains unresponsive. ? ? ?Objective   ?Blood pressure 126/89, pulse  88, temperature 99.1 ?F (37.3 ?C), temperature source Oral, resp. rate (!) 22, height 5\' 10"  (1.778 m), weight 106.2 kg, SpO2 99 %. ?   ?FiO2 (%):  [28 %] 28 %  ? ?Intake/Output Summary (Last 24 hours) at 05/08/2021 1626 ?Last data filed at 05/08/2021 0600 ?Gross per 24 hour  ?Intake 1155 ml  ?Output 900 ml  ?Net 255 ml  ? ? ? ?Filed Weights  ? ?Examination:  ?Chronically ill appearing man lying in bed in NAD ?/AT, eyes anicteric ?Not blinking to threat, not tracking, possibly opening eyes to verbal stimulation during exam. Not following commands.  ?Trach in place, no bleeding or drainage, stoma healing well ?S1S2, RRR ?Minimal rhonchi, breathing comfortably on TC ?Abd soft, NT. No erythema around PEG ?Mild pedal and ankle edema, no significant dependent edema or UE edema. No cyanosis. Muscle wasting. ?Skin warm, dry, no diffuse rashes. ? ?Labs reviewed.  ?BUN 26 ?Cr 0.71 ?WBC 12.5 ?H/H 12.5/27.4 ?3/5 blood cultures> GPC 3/4 ?Resp culture 2/28> pan sensitive Pseudomonas ? ?Ancillary tests personally reviewed:    ? ?Assessment & Plan:  ?OOH Vfib arrest ?Post arrest anoxic brain injury, severe ?Post arrest myoclonus improved with klonopin, no AEDs recommended ?Post arrest respiratory failure- vent dependent due to mental status and secretions ?Post arrest cardiomyopathy ?Post arrest AKI, improved/stable ?HTN- stable on current regimen ?HCAP w/ MSSA- ABX > 2/21 ?Likely GPC bacteremia ? ? ?Plan:  ?-Con't routine trach care ?-Con't trach collar ?-No plans to decannulate unless there is a very significant improvement in mental status, which I do not expect based on his trajectory so far. He is high risk for recurrent respiratory failure episodes due to mental  status and the ability to rescue suction and maintain his airway will be important for his ongoing care. ?-Appreciate SLP's care. Only approved for PMV with SLP supervision. Concern for air trapping. ?-Complete 2 weeks of antibiotics for pseudomonas pneumonia--  consider deescalating since it is pan-sensitive. ?-Consider ID consultation for bacteremia. Needs repeat blood cultures. Agree with adding vancomycin. ?- Continue klonipin. ? ?PCCM will follow weekly for trach management. Please call if questions arise in the interim. ? ?Best Practice (right click and "Reselect all SmartList Selections" daily)  ?Per trh ? ? ?Steffanie Dunn, DO ? Pulmonary & Critical Care ?See Amion for contact info ?05/08/2021 4:26 PM ? ? ? ? ? ? ? ? ? ? ? ? ? ? ? ? ? ? ?

## 2021-05-08 NOTE — Progress Notes (Signed)
Md made aware of patients elevated blood pressures , ordered to give 06:00 am dose of hydralazine now.  ?

## 2021-05-09 DIAGNOSIS — R7881 Bacteremia: Secondary | ICD-10-CM

## 2021-05-09 LAB — CBC
HCT: 33.1 % — ABNORMAL LOW (ref 39.0–52.0)
Hemoglobin: 10.9 g/dL — ABNORMAL LOW (ref 13.0–17.0)
MCH: 31.2 pg (ref 26.0–34.0)
MCHC: 32.9 g/dL (ref 30.0–36.0)
MCV: 94.8 fL (ref 80.0–100.0)
Platelets: 181 10*3/uL (ref 150–400)
RBC: 3.49 MIL/uL — ABNORMAL LOW (ref 4.22–5.81)
RDW: 15.6 % — ABNORMAL HIGH (ref 11.5–15.5)
WBC: 11.9 10*3/uL — ABNORMAL HIGH (ref 4.0–10.5)
nRBC: 0 % (ref 0.0–0.2)

## 2021-05-09 LAB — BASIC METABOLIC PANEL
Anion gap: 11 (ref 5–15)
BUN: 26 mg/dL — ABNORMAL HIGH (ref 6–20)
CO2: 27 mmol/L (ref 22–32)
Calcium: 8.2 mg/dL — ABNORMAL LOW (ref 8.9–10.3)
Chloride: 102 mmol/L (ref 98–111)
Creatinine, Ser: 0.73 mg/dL (ref 0.61–1.24)
GFR, Estimated: >60 mL/min (ref >60)
Glucose, Bld: 95 mg/dL (ref 70–99)
Potassium: 3.9 mmol/L (ref 3.5–5.1)
Sodium: 140 mmol/L (ref 135–145)

## 2021-05-09 LAB — MAGNESIUM: Magnesium: 2.1 mg/dL (ref 1.7–2.4)

## 2021-05-09 NOTE — Progress Notes (Signed)
Occupational Therapy Contact Note ? ?Called pt's wife, Theola Sequin, on 05/09/21 at 13:15. Organized a time for family education (including HEP, positioning, and ADL care) to optimize patient care and decrease caregiver burden. Pt's wife reporting she will be available Sunday 05/14/21 after 13:00. Plan for OT to return for family education at this date and time. ? ?Ellagrace Yoshida MSOT, OTR/L ?Acute Rehab ?Pager: [phone] ?Office: [phone] ?

## 2021-05-09 NOTE — Consult Note (Signed)
Billy Cherry for Infectious Disease       Reason for Consult: bacteremia    Referring Physician: Dr. Trina Ao  Principal Problem:   Cardiac arrest [facility]) Active Problems:   Acute respiratory failure ([name])   Myoclonus   Anoxic encephalopathy ([name])   Hypernatremia   HFrEF (heart failure with reduced ejection fraction) ([name])   RLL pneumonia   Sepsis ([name])   HTN (hypertension)   Dysphagia   Obesity, Class I, BMI 30-34.9   Staphylococcus epidermidis bacteremia    amLODipine  10 mg Per Tube Daily   atropine  2 drop Sublingual QID   carvedilol  25 mg Per Tube BID   chlorhexidine gluconate (MEDLINE KIT)  15 mL Mouth Rinse BID   clonazepam  0.5 mg Per Tube TID   doxazosin  2 mg Per Tube Daily   feeding supplement (JEVITY 1.5 CAL/FIBER)  355 mL Per Tube TID WC   feeding supplement (PROSource TF)  45 mL Per Tube BID   fiber  1 packet Per Tube BID   free water  400 mL Per Tube Q6H   Gerhardt's butt cream   Topical Daily   heparin  5,000 Units Subcutaneous Q8H   hydrALAZINE  75 mg Per Tube Q8H   isosorbide dinitrate  20 mg Per Tube TID   levETIRAcetam  750 mg Per Tube BID   mouth rinse  15 mL Mouth Rinse q12n4p   pantoprazole sodium  40 mg Per Tube QHS   scopolamine  1 patch Transdermal Q72H   valproic acid  750 mg Per Tube TID    Recommendations: Stop vancomycin Can stop ceftazidime Repeat blood cultures  Assessment: He has 2 positive blood culture sets with 2 different CoNS organisms checked in the setting of a low-grade fever x 1.  He has not had any fever since that time and with 2 different organisms in cultures both from the same site and inadequate volume makes this suspicious for a contaminate.  I will repeat the blood cultures and hold vancomycin pending any new growth with no concerning signs of active infection.  He is on ceftazidime for presumed pneumonia and has completed 8 days today with Pseudomonas in culture.  There is no overt opacity on his CXR,  no fever, clear secretions and no concerning signs of ongoing pneumonia.  Can stop the ceftazidime today and observe off of antibiotics.    Antibiotics: Vancomycin + ceftazidime  HPI: Billy Cherry is a 51 y.o. male with a history of out of hospital cardiac arrest with CPR, epinephrine with return of spontaneous circulation now s/p trach and with an anoxic brain injury with positive blood cultures.  On 05/07/21 he had a low grade fever of 100.7 and blood cultures sent and now positive for Staph capitus and Staph epidermidis, methicillin resistant.  He has had no further fever.  WBC 11.9 now.  He is unresponsive.  Also with concern for pneumonia with a positive trach aspirate with Pseudomonas and placed on ceftazidime.  He is on a trach collar, no vent.  CXR with some atelectasis on 2/28 and no significant findings on CXR on 05/07/21.     Review of Systems: Unable to be assessed due to patient factors   Past Medical History:  Diagnosis Date   Hypertension     Social History   Tobacco Use   Smoking status: Never   Smokeless tobacco: Never  Vaping Use   Vaping Use: Unknown  Substance Use Topics   Alcohol  use: Yes   Drug use: Never    History reviewed. No pertinent family history.  Allergies  Allergen Reactions   Lisinopril Swelling    angioedema    Physical Exam: Constitutional: in no apparent distress  Vitals:   05/09/21 0714 05/09/21 0751  BP: 111/84   Pulse: (!) 101   Resp: (!) 21 20  Temp: 99.7 F (37.6 C)   SpO2: 100%    EYES: anicteric ENMT: + trach, + clear secretion Cardiovascular: Cor RRR Respiratory: clear; Musculoskeletal: no pedal edema noted Skin: no rash  Lab Results  Component Value Date   WBC 11.9 (H) 05/09/2021   HGB 10.9 (L) 05/09/2021   HCT 33.1 (L) 05/09/2021   MCV 94.8 05/09/2021   PLT 181 05/09/2021    Lab Results  Component Value Date   CREATININE 0.73 05/09/2021   BUN 26 (H) 05/09/2021   NA 140 05/09/2021   K 3.9 05/09/2021   CL  102 05/09/2021   CO2 27 05/09/2021    Lab Results  Component Value Date   ALT 22 05/08/2021   AST 29 05/08/2021   ALKPHOS 45 05/08/2021     Microbiology: Recent Results (from the past 240 hour(s))  Culture, Respiratory w Gram Stain     Status: None   Collection Time: 05/02/21  7:13 AM   Specimen: Tracheal Aspirate; Respiratory  Result Value Ref Range Status   Specimen Description TRACHEAL ASPIRATE  Final   Special Requests NONE  Final   Gram Stain   Final    MODERATE SQUAMOUS EPITHELIAL CELLS PRESENT ABUNDANT WBC PRESENT, PREDOMINANTLY PMN FEW GRAM NEGATIVE RODS MODERATE GRAM POSITIVE COCCI    Culture   Final    FEW PSEUDOMONAS AERUGINOSA WITHIN MIXED NORMAL RESPIRATORY FLORA Performed at [facility], [place] [address] [place], [place] [address]    Report Status 05/05/2021 FINAL  Final   Organism ID, Bacteria PSEUDOMONAS AERUGINOSA  Final      Susceptibility   Pseudomonas aeruginosa - MIC*    CEFTAZIDIME 4 SENSITIVE Sensitive     CIPROFLOXACIN <=0.25 SENSITIVE Sensitive     GENTAMICIN <=1 SENSITIVE Sensitive     IMIPENEM 2 SENSITIVE Sensitive     PIP/TAZO 8 SENSITIVE Sensitive     CEFEPIME 2 SENSITIVE Sensitive     * FEW PSEUDOMONAS AERUGINOSA  Culture, blood (routine x 2)     Status: Abnormal (Preliminary result)   Collection Time: 05/07/21 12:30 PM   Specimen: BLOOD RIGHT HAND  Result Value Ref Range Status   Specimen Description BLOOD RIGHT HAND  Final   Special Requests   Final    BOTTLES DRAWN AEROBIC AND ANAEROBIC Blood Culture results may not be optimal due to an inadequate volume of blood received in culture bottles   Culture  Setup Time   Final    GRAM POSITIVE COCCI IN CLUSTERS IN BOTH AEROBIC AND ANAEROBIC BOTTLES CRITICAL RESULT CALLED TO, READ BACK BY AND VERIFIED WITH: Vena Austria E.UELAND ON [id number] AT 1424 BY E.PARRISH    Culture (A)  Final    STAPHYLOCOCCUS EPIDERMIDIS STAPHYLOCOCCUS CAPITIS CULTURE REINCUBATED FOR BETTER GROWTH Performed at  [facility], [place] [address], [place], [place] [address]    Report Status PENDING  Incomplete  Blood Culture ID Panel (Reflexed)     Status: Abnormal   Collection Time: 05/07/21 12:30 PM  Result Value Ref Range Status   Enterococcus faecalis NOT DETECTED NOT DETECTED Final   Enterococcus Faecium NOT DETECTED NOT DETECTED Final  Listeria monocytogenes NOT DETECTED NOT DETECTED Final   Staphylococcus species DETECTED (A) NOT DETECTED Final    Comment: CRITICAL RESULT CALLED TO, READ BACK BY AND VERIFIED WITH: Vena Austria E.UELAND ON [id number] AT 1424 BY E.PARRISH    Staphylococcus aureus (BCID) NOT DETECTED NOT DETECTED Final   Staphylococcus epidermidis DETECTED (A) NOT DETECTED Final    Comment: Methicillin (oxacillin) resistant coagulase negative staphylococcus. Possible blood culture contaminant (unless isolated from more than one blood culture draw or clinical case suggests pathogenicity). No antibiotic treatment is indicated for blood  culture contaminants. CRITICAL RESULT CALLED TO, READ BACK BY AND VERIFIED WITH: Vena Austria E.UELAND ON [id number] AT 1962 BY E.PARRISH    Staphylococcus lugdunensis NOT DETECTED NOT DETECTED Final   Streptococcus species NOT DETECTED NOT DETECTED Final   Streptococcus agalactiae NOT DETECTED NOT DETECTED Final   Streptococcus pneumoniae NOT DETECTED NOT DETECTED Final   Streptococcus pyogenes NOT DETECTED NOT DETECTED Final   A.calcoaceticus-baumannii NOT DETECTED NOT DETECTED Final   Bacteroides fragilis NOT DETECTED NOT DETECTED Final   Enterobacterales NOT DETECTED NOT DETECTED Final   Enterobacter cloacae complex NOT DETECTED NOT DETECTED Final   Escherichia coli NOT DETECTED NOT DETECTED Final   Klebsiella aerogenes NOT DETECTED NOT DETECTED Final   Klebsiella oxytoca NOT DETECTED NOT DETECTED Final   Klebsiella pneumoniae NOT DETECTED NOT DETECTED Final   Proteus species NOT DETECTED NOT DETECTED Final   Salmonella species NOT DETECTED NOT  DETECTED Final   Serratia marcescens NOT DETECTED NOT DETECTED Final   Haemophilus influenzae NOT DETECTED NOT DETECTED Final   Neisseria meningitidis NOT DETECTED NOT DETECTED Final   Pseudomonas aeruginosa NOT DETECTED NOT DETECTED Final   Stenotrophomonas maltophilia NOT DETECTED NOT DETECTED Final   Candida albicans NOT DETECTED NOT DETECTED Final   Candida auris NOT DETECTED NOT DETECTED Final   Candida glabrata NOT DETECTED NOT DETECTED Final   Candida krusei NOT DETECTED NOT DETECTED Final   Candida parapsilosis NOT DETECTED NOT DETECTED Final   Candida tropicalis NOT DETECTED NOT DETECTED Final   Cryptococcus neoformans/gattii NOT DETECTED NOT DETECTED Final   Methicillin resistance mecA/C DETECTED (A) NOT DETECTED Final    Comment: CRITICAL RESULT CALLED TO, READ BACK BY AND VERIFIED WITH: Nevin Bloodgood ON [id number] AT 1424 BY E.PARRISH Performed at [facility], [address], [place], [place] [address]   Culture, blood (routine x 2)     Status: Abnormal (Preliminary result)   Collection Time: 05/07/21 12:37 PM   Specimen: BLOOD RIGHT HAND  Result Value Ref Range Status   Specimen Description BLOOD RIGHT HAND  Final   Special Requests   Final    BOTTLES DRAWN AEROBIC ONLY Blood Culture adequate volume   Culture  Setup Time   Final    GRAM POSITIVE COCCI IN CLUSTERS AEROBIC BOTTLE ONLY CRITICAL VALUE NOTED.  VALUE IS CONSISTENT WITH PREVIOUSLY REPORTED AND CALLED VALUE. Performed at [facility], [place] [address], [place], [place] [address]    Culture STAPHYLOCOCCUS CAPITIS (A)  Final   Report Status PENDING  Incomplete    Thayer Headings, [facility] for Infectious Disease [facility] [email or web address] 05/09/2021, 9:55 AM

## 2021-05-09 NOTE — Progress Notes (Signed)
Wife never came to visit during shift. She called to inquire about him and his care at approximately 1845.  ?Pt had elevated blood pressures most of shift and remained the same. Responses to pain when pinched it seemed. Mouth care and trach are performed during shift several times. Pt tolerated bolus tube feedings well. ? ?At beginning of shift verified placement of peg tube by ausculation and residual was about 10 ml which were returned. Two 400 ml flushes were done. Pt had one bowel movement on shift. ?

## 2021-05-09 NOTE — Assessment & Plan Note (Addendum)
MRSE.  Infectious disease was consulted.  Recommendation is to hold off with antibiotic for now and closely observe.  Repeat blood cultures have been sent on 05/09/2021, negative in 4 days

## 2021-05-09 NOTE — Progress Notes (Signed)
PROGRESS NOTE    Billy Cherry  TKP:[id number] DOB: 08/20/1970 DOA: 03/30/2021 PCP: Patient, No Pcp Per (Inactive)    Brief Narrative:  51 years old male with past medical history of hypertension was brought into the hospital after being found unresponsive by his wife.  EMS was called in and patient was noted to be in ventricular fibrillation with agonal respirations.  Patient was given CPR, epinephrine and had return of spontaneous circulation.  EKG showed anterolateral ST elevation and was taken to Cath Lab. Dr Josefa Half performed cath> insignificant CAD and EF 45-50%. Patient remained on ventilator after cath and was admitted to the ICU. Underwent TTM.  Neurology was consulted due to rhythmic twitching of his lips tongue and was started on Keppra.  MRI of the brain showed evidence of hypoxia with bilateral ACA MCA watershed infarcts.  2D echocardiogram showed LV ejection fraction of 20% with global hypokinesis.  Patient was also treated for ventilator associated pneumonia.  At this time, patient has undergone tracheostomy and PEG tube placement.  Patient did have a spike of fever on 2/28 thought to be secondary to aspiration and was on Fortaz since respiratory culture was growing Pseudomonas.  Blood culture however grew out MRSE in 3 out of 3 bottles.  Vancomycin has been added to the regimen.       Assessment and Plan: * Cardiac arrest ([place]) Postarrest anoxic encephalopathy Postarrest respiratory failure s/p TC S/P PEG tube placement Postarrest myoclonus: 2D echocardiogram showed reduced LV function at 20%.  Continue tracheostomy with trach collar.  Continue PEG tube feeding.  Remains obtunded with anoxic encephalopathy-on Keppra, for myoclonus and can increase Klonopin for breakthrough myoclonus.  Continue supportive care.  At this time plan is home with home health-Home health/patient's wife and family will be taking turns to take care of him he will need around-the-clock care.Enteral tube  feeding trach kit and supplies to be  arranged.  Anoxic encephalopathy ([place]) Continue supportive care.  RLL pneumonia Sepsis secondary to Pseudomonas pneumonia.   Previously had completed vancomycin for MSSA and [name].  On IV Fortaz at this time and is 8-day  antibiotic today.  Pulmonary recommends 2 weeks of antibiotic for pseudomonal pneumonia.  Continue respiratory care scopolamine.  Patient is at a very high risk of recurrent pneumonia.  Patient still has temperature max of 100.3 F.  WBC at 11.0  Acute respiratory failure ([name]) Status post trach collar.  Continue supportive care  Staphylococcus epidermidis bacteremia MRSE.  Noted in 3 bottles.  No indwelling Foley catheter/central venous catheter.  Patient does have a tracheostomy and PEG tube but no obvious signs of induration or infection.  Unclear etiology of bacteremia.  Patient did have a spike of fever could be secondary to this bacteremia.  Vancomycin has been added to the regimen starting 05/08/2021.  We will consult infectious disease for further evaluation and opinion.  Dysphagia PEG tube in place.  X-ray KUB negative for obstruction.  Tolerating tube feeds  Obesity, Class I, BMI 30-34.9 BMI 33.  HTN (hypertension) Continue Coreg Isordil amlodipine Cardura hydralazine and PRN Labetalol.  HFrEF (heart failure with reduced ejection fraction) ([name]) 2D echocardiogram swelling LVEF less than 20%, continue coreg, imdur, amlodipine, doxazosin, hydralazine, labetalol as needed  Hypernatremia Continue free water 400 cc Q 6 hrs for now, latest sodium of 140.  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  Continue current AED with Klonopin, Keppra. per neuro notes, suppressing his myoclonus will not ultimately change the prognosis   Nutrition through the  PEG tube.  Continue water flushes.   DVT prophylaxis: heparin injection 5,000 Units Start: 03/30/21 2000   Code Status:     Code Status: Full Code  Disposition: Home  versus long-term care  Status is: Inpatient  Remains inpatient appropriate because: Pending clinical improvement, persistent fever, IV antibiotic   Family Communication: None  Consultants:  Palliative, ENT, GI, neuro, cardiology, PCCM, Infectious disease  Procedures:  IR guided gastrostomy tube placement on 04/26/2021 Endotracheal tube placement mechanical ventilation. Tracheostomy   Antimicrobials:  Elita Quick 05/02/2021> Vancomycin 05/08/21>  Anti-infectives (From admission, onward)    Start     Dose/Rate Route Frequency Ordered Stop   05/09/21 0800  vancomycin (VANCOREADY) IVPB 1250 mg/250 mL        1,250 mg 166.7 mL/hr over 90 Minutes Intravenous Every 12 hours 05/08/21 1621     05/08/21 1715  vancomycin (VANCOREADY) IVPB 2000 mg/400 mL        2,000 mg 200 mL/hr over 120 Minutes Intravenous  Once 05/08/21 1618 05/08/21 1943   05/02/21 1000  cefTAZidime (FORTAZ) 2 g in sodium chloride 0.9 % 100 mL IVPB        2 g 200 mL/hr over 30 Minutes Intravenous Every 8 hours 05/02/21 0846 05/12/21 0959   04/26/21 0756  ceFAZolin (ANCEF) 2-4 GM/100ML-% IVPB  Status:  Discontinued       Note to Pharmacy: Kevan Ny L: cabinet override      04/26/21 0756 04/26/21 0855   04/26/21 0600  ceFAZolin (ANCEF) IVPB 2g/100 mL premix        2 g 200 mL/hr over 30 Minutes Intravenous Every 8 hours 04/25/21 1131 04/26/21 0915   04/23/21 1400  cefTAZidime (FORTAZ) 1 g in sodium chloride 0.9 % 100 mL IVPB        1 g 200 mL/hr over 30 Minutes Intravenous Every 8 hours 04/23/21 1256 04/25/21 2331   04/20/21 1300  ceFEPIme (MAXIPIME) 2 g in sodium chloride 0.9 % 100 mL IVPB  Status:  Discontinued        2 g 200 mL/hr over 30 Minutes Intravenous Every 8 hours 04/20/21 1205 04/23/21 1241   04/14/21 0930  vancomycin (VANCOREADY) IVPB 1250 mg/250 mL        1,250 mg 166.7 mL/hr over 90 Minutes Intravenous Every 24 hours 04/13/21 0902 04/19/21 1247   04/13/21 0930  vancomycin (VANCOREADY) IVPB 2000 mg/400 mL         2,000 mg 200 mL/hr over 120 Minutes Intravenous  Once 04/13/21 0831 04/13/21 1158   04/12/21 0815  ceFEPIme (MAXIPIME) 2 g in sodium chloride 0.9 % 100 mL IVPB  Status:  Discontinued        2 g 200 mL/hr over 30 Minutes Intravenous Every 8 hours 04/12/21 0724 04/13/21 0829   04/06/21 1800  ceFAZolin (ANCEF) IVPB 2g/100 mL premix        2 g 200 mL/hr over 30 Minutes Intravenous Every 8 hours 04/06/21 1403 04/10/21 1913   04/04/21 1000  ceFEPIme (MAXIPIME) 2 g in sodium chloride 0.9 % 100 mL IVPB  Status:  Discontinued        2 g 200 mL/hr over 30 Minutes Intravenous Every 8 hours 04/04/21 0919 04/06/21 1403       Subjective: Today, patient was seen and examined at bedside.  Patient is nonverbal.  Patient was noted to have staph epidermidis bacteremia.  Patient max reported at 100.3 F  Objective: Vitals:   05/09/21 0400 05/09/21 0455 05/09/21 0714 05/09/21 0751  BP: (!) 135/96  111/84   Pulse: 94 98 (!) 101   Resp: (!) 21 (!) 21 (!) 21 20  Temp: 100.3 F (37.9 C)  99.7 F (37.6 C)   TempSrc: Oral  Oral   SpO2: 99% 99% 100%   Weight:      Height:        Intake/Output Summary (Last 24 hours) at 05/09/2021 0919 Last data filed at 05/08/2021 1805 Gross per 24 hour  Intake --  Output 700 ml  Net -700 ml   Filed Weights   05/09/21 0300  Weight: 98 kg    Physical Examination:  General: , not in obvious distress, nonverbal, nonresponsive, opens eyes spontaneously.Marland Kitchen  Appears chronically ill and deconditioned. HENT:   No scleral pallor or icterus noted. Oral mucosa is moist.  Tracheostomy collar in place. Chest:   Diminished breath sounds bilaterally. CVS: S1 &S2 heard. No murmur.  Regular rate and rhythm. Abdomen: Soft, nontender, nondistended.  Bowel sounds are heard.  PEG tube in place. Extremities: No cyanosis, clubbing or edema.  Peripheral pulses are palpable. Psych: Nonverbal, nonresponsive. CNS: Flaccid extremities, nonverbal, opens eyes spontaneously, not  following commands, muscle wasting noted. Skin: Warm and dry.     Data Reviewed:   CBC: Recent Labs  Lab 05/03/21 0939 05/07/21 1101 05/08/21 0426 05/09/21 0329  WBC 10.6* 11.5* 12.5* 11.9*  NEUTROABS  --  8.0* 9.4*  --   HGB 12.9* 11.0* 12.5* 10.9*  HCT 38.9* 34.7* 37.4* 33.1*  MCV 93.7 95.9 94.2 94.8  PLT 194 178 174 701    Basic Metabolic Panel: Recent Labs  Lab 05/07/21 1101 05/08/21 0426 05/09/21 0329  NA 140 140 140  K 3.9 4.1 3.9  CL 103 101 102  CO2 $Re'29 30 27  'aFm$ GLUCOSE 111* 102* 95  BUN 27* 26* 26*  CREATININE 0.80 0.71 0.73  CALCIUM 8.2* 8.4* 8.2*  MG  --   --  2.1    Liver Function Tests: Recent Labs  Lab 05/07/21 1101 05/08/21 0426  AST 28 29  ALT 23 22  ALKPHOS 38 45  BILITOT 0.4 0.5  PROT 6.3* 6.6  ALBUMIN 1.8* 1.8*     Radiology Studies: DG CHEST PORT 1 VIEW  Result Date: 05/07/2021 CLINICAL DATA:  Fever, pleural effusion EXAM: PORTABLE CHEST 1 VIEW COMPARISON:  05/02/2021 FINDINGS: Tracheostomy tube in satisfactory position. Mild bilateral interstitial thickening. No focal consolidation. No pleural effusion or pneumothorax. Stable cardiomegaly. No acute osseous abnormality. IMPRESSION: 1. No acute cardiopulmonary disease. Electronically Signed   By: Kathreen Devoid M.D.   On: 05/07/2021 12:36      LOS: 40 days    Flora Lipps, MD [facility] 05/09/2021, 9:19 AM

## 2021-05-09 NOTE — Progress Notes (Signed)
Sacral dressing was changed after bowel movement. Pt tolerated procedure well.  ?

## 2021-05-09 NOTE — Progress Notes (Signed)
Nutrition Follow-up ? ?DOCUMENTATION CODES:  ? ?Not applicable ? ?INTERVENTION:  ? ?Continue bolus Tube Feeds via PEG: ?355 mL Jevity 1.5 - TID (1 1/2 cartons/bolus) ?45 mL ProSource TF - BID ?Continue 400 mL free water q6h per MD ?Provides 2213 kcal, 113 gm PRO, and 1080 mL free water (2680 mL total free water) daily.  ? ?NUTRITION DIAGNOSIS:  ? ?Inadequate oral intake related to inability to eat as evidenced by NPO status. - Ongoing, being addressed via TF ? ?GOAL:  ? ?Patient will meet greater than or equal to 90% of their needs - Being addressed via TF ? ?MONITOR:  ? ?Labs, Weight trends, TF tolerance, Skin ? ?REASON FOR ASSESSMENT:  ? ?Ventilator, Consult ?Enteral/tube feeding initiation and management ? ?ASSESSMENT:  ? ?51 year old male who originally presented to [facility] on 1/19 after out-of-hospital cardiac arrest due to suspected angioedema from ACE inhibitor leading to asphyxiation. PMH of HTN, HLD. ? ?2/11 - tracheostomy placed ?2/22 - PEG placed  ? ?Per RN, pt is tolerating bolus feeds.  ? ?RD examined muscle loss on legs and temples.  ? ?Current plan is for pt to be discharged home with home health and care from wife and family.  ? ?Medications reviewed and include: Fiber, Protonix, IV antibiotics  ?Labs reviewed. ? ?Diet Order:   ? ? None  ? ?EDUCATION NEEDS:  ? ?Not appropriate for education at this time ? ?Skin:  Skin Assessment: Skin Integrity Issues: ?Skin Integrity Issues:: Other (Comment) ?Other: MASD buttocks ? ?Last BM:  3/6 ? ?Height:  ? ?Ht Readings from Last 1 Encounters:  ?04/18/21 5\' 10"  (1.778 m)  ? ? ?Weight:  ? ?Wt Readings from Last 1 Encounters:  ?05/09/21 98 kg  ? ? ?Ideal Body Weight:  75.5 kg ? ?BMI:  Body mass index is 31 kg/m?. ? ?Estimated Nutritional Needs:  ? ?Kcal:  2200-2400 ? ?Protein:  110-125 grams ? ?Fluid:  >/= 2.2 L ? ? ?07/09/21 RD, LDN ?Clinical Dietitian ?See AMiON for contact information.  ? ?

## 2021-05-10 LAB — CULTURE, BLOOD (ROUTINE X 2): Special Requests: ADEQUATE

## 2021-05-10 LAB — CBC
HCT: 34.7 % — ABNORMAL LOW (ref 39.0–52.0)
Hemoglobin: 11.2 g/dL — ABNORMAL LOW (ref 13.0–17.0)
MCH: 30.9 pg (ref 26.0–34.0)
MCHC: 32.3 g/dL (ref 30.0–36.0)
MCV: 95.6 fL (ref 80.0–100.0)
Platelets: 198 10*3/uL (ref 150–400)
RBC: 3.63 MIL/uL — ABNORMAL LOW (ref 4.22–5.81)
RDW: 15.7 % — ABNORMAL HIGH (ref 11.5–15.5)
WBC: 11.9 10*3/uL — ABNORMAL HIGH (ref 4.0–10.5)
nRBC: 0 % (ref 0.0–0.2)

## 2021-05-10 LAB — BASIC METABOLIC PANEL
Anion gap: 10 (ref 5–15)
BUN: 27 mg/dL — ABNORMAL HIGH (ref 6–20)
CO2: 28 mmol/L (ref 22–32)
Calcium: 8.1 mg/dL — ABNORMAL LOW (ref 8.9–10.3)
Chloride: 103 mmol/L (ref 98–111)
Creatinine, Ser: 0.71 mg/dL (ref 0.61–1.24)
GFR, Estimated: >60 mL/min (ref >60)
Glucose, Bld: 96 mg/dL (ref 70–99)
Potassium: 3.9 mmol/L (ref 3.5–5.1)
Sodium: 141 mmol/L (ref 135–145)

## 2021-05-10 LAB — MAGNESIUM: Magnesium: 2.2 mg/dL (ref 1.7–2.4)

## 2021-05-10 NOTE — Plan of Care (Signed)
?  Problem: Education: ?Goal: Knowledge of General Education information will improve ?Description: Including pain rating scale, medication(s)/side effects and non-pharmacologic comfort measures ?Outcome: Not Applicable ?  ?Problem: Clinical Measurements: ?Goal: Ability to maintain clinical measurements within normal limits will improve ?Outcome: Not Applicable ?  ?Problem: Clinical Measurements: ?Goal: Cardiovascular complication will be avoided ?Outcome: Not Applicable ?  ?

## 2021-05-10 NOTE — Progress Notes (Signed)
Patient seen today by trach team for consult.  No education is needed at this time.  All the necessary equipment is at the beside.   Will continue to follow for progression.  

## 2021-05-10 NOTE — Progress Notes (Signed)
PROGRESS NOTE    Landyn Buckalew  GLO:[id number] DOB: 17-Jun-1970 DOA: 03/30/2021 PCP: Patient, No Pcp Per (Inactive)    Brief Narrative:  51 years old male with past medical history of hypertension was brought into the hospital after being found unresponsive by his wife.  EMS was called in and patient was noted to be in ventricular fibrillation with agonal respirations.  Patient was given CPR, epinephrine and had return of spontaneous circulation.  EKG showed anterolateral ST elevation and was taken to Cath Lab. Dr Josefa Half performed cath> insignificant CAD and EF 45-50%. Patient remained on ventilator after cath and was admitted to the ICU. Underwent TTM.  Neurology was consulted due to rhythmic twitching of his lips tongue and was started on Keppra.  MRI of the brain showed evidence of hypoxia with bilateral ACA MCA watershed infarcts.  2D echocardiogram showed LV ejection fraction of 20% with global hypokinesis.  Patient was also treated for ventilator associated pneumonia.  At this time, patient has undergone tracheostomy and PEG tube placement.  Patient did have a spike of fever on 2/28 thought to be secondary to aspiration and was on Fortaz since respiratory culture was growing Pseudomonas.  Blood culture however grew out MRSE in 3 out of 3 bottles.  Infectious disease was consulted but at this time ID has sent repeat blood cultures and patient is being observed off antibiotic.       Assessment and Plan: * Cardiac arrest ([place]) Postarrest anoxic encephalopathy Postarrest respiratory failure s/p TC S/P PEG tube placement Postarrest myoclonus: 2D echocardiogram showed reduced LV function at 20%.  Continue tracheostomy with trach collar.  Continue PEG tube feeding.  Remains obtunded with anoxic encephalopathy-on Keppra, for myoclonus and can increase Klonopin for breakthrough myoclonus.  Continue supportive care.  At this time plan is home with home health-Home health/patient's wife and family  will be taking turns to take care of him he will need around-the-clock care.Enteral tube feeding trach kit and supplies to be  arranged.  Anoxic encephalopathy ([place]) Continue supportive care.  RLL pneumonia Sepsis secondary to Pseudomonas pneumonia.   Previously had completed vancomycin for MSSA and [name].  Received more than 7-day course of IV Fortaz.  Infectious disease on board and patient is currently being observed without antibiotic.   Continue respiratory care scopolamine.  Patient is at a very high risk of recurrent pneumonia.  Patient with temperature max of 99.7 F.  WBC at 11.9.  Blood cultures have been sent yesterday, follow cultures.  Acute respiratory failure ([name]) Status post trach collar.  Continue supportive care  Staphylococcus epidermidis bacteremia MRSE.  Infectious disease was consulted.  Recommendation is to hold off with antibiotic for now and closely observe.  Repeat blood cultures have been sent on 05/09/2021.  Will follow.  no indwelling Foley catheter/central venous catheter.  Patient does have a tracheostomy and PEG tube but no obvious signs of induration or infection.  We will follow ID recommendations.  Dysphagia PEG tube in place.  X-ray KUB negative for obstruction.  Tolerating tube feeds  Obesity, Class I, BMI 30-34.9 BMI 33.  HTN (hypertension) Continue Coreg Isordil amlodipine Cardura hydralazine and PRN Labetalol.  HFrEF (heart failure with reduced ejection fraction) ([name]) 2D echocardiogram swelling LVEF less than 20%, continue coreg, imdur, amlodipine, doxazosin, hydralazine, labetalol as needed  Hypernatremia Continue free water 400 cc Q 6 hrs for now, latest sodium of 141.  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  Continue current AED with Klonopin, Keppra. per neuro notes, suppressing  his myoclonus will not ultimately change the prognosis   Nutrition through the PEG tube.  Continue water flushes.   DVT prophylaxis: heparin injection  5,000 Units Start: 03/30/21 2000   Code Status:     Code Status: Full Code  Disposition: Home versus long-term care  Status is: Inpatient  Remains inpatient appropriate because: Pending clinical improvement, fever, being observed off antibiotic, repeat blood cultures to be followed    Family Communication:  I spoke with the patient's wife on the phone and updated her about the clinical condition of the patient.  She is worried about the patient and the lack of support and supplies and ongoing care at home.  Transition of care on board.  Consultants:  Palliative,  ENT,  GI,  neuro,  cardiology,  PCCM,  Infectious disease  Procedures:  IR guided gastrostomy tube placement on 04/26/2021 Endotracheal tube placement mechanical ventilation. Tracheostomy   Antimicrobials:  Tressie Ellis 05/02/2021>3/8 Vancomycin 05/08/21>05/09/21  Anti-infectives (From admission, onward)    Start     Dose/Rate Route Frequency Ordered Stop   05/09/21 0800  vancomycin (VANCOREADY) IVPB 1250 mg/250 mL  Status:  Discontinued        1,250 mg 166.7 mL/hr over 90 Minutes Intravenous Every 12 hours 05/08/21 1621 05/09/21 0954   05/08/21 1715  vancomycin (VANCOREADY) IVPB 2000 mg/400 mL        2,000 mg 200 mL/hr over 120 Minutes Intravenous  Once 05/08/21 1618 05/08/21 1943   05/02/21 1000  cefTAZidime (FORTAZ) 2 g in sodium chloride 0.9 % 100 mL IVPB  Status:  Discontinued        2 g 200 mL/hr over 30 Minutes Intravenous Every 8 hours 05/02/21 0846 05/10/21 0936   04/26/21 0756  ceFAZolin (ANCEF) 2-4 GM/100ML-% IVPB  Status:  Discontinued       Note to Pharmacy: [name] L: cabinet override      04/26/21 0756 04/26/21 0855   04/26/21 0600  ceFAZolin (ANCEF) IVPB 2g/100 mL premix        2 g 200 mL/hr over 30 Minutes Intravenous Every 8 hours 04/25/21 1131 04/26/21 0915   04/23/21 1400  cefTAZidime (FORTAZ) 1 g in sodium chloride 0.9 % 100 mL IVPB        1 g 200 mL/hr over 30 Minutes Intravenous Every 8  hours 04/23/21 1256 04/25/21 2331   04/20/21 1300  ceFEPIme (MAXIPIME) 2 g in sodium chloride 0.9 % 100 mL IVPB  Status:  Discontinued        2 g 200 mL/hr over 30 Minutes Intravenous Every 8 hours 04/20/21 1205 04/23/21 1241   04/14/21 0930  vancomycin (VANCOREADY) IVPB 1250 mg/250 mL        1,250 mg 166.7 mL/hr over 90 Minutes Intravenous Every 24 hours 04/13/21 0902 04/19/21 1247   04/13/21 0930  vancomycin (VANCOREADY) IVPB 2000 mg/400 mL        2,000 mg 200 mL/hr over 120 Minutes Intravenous  Once 04/13/21 0831 04/13/21 1158   04/12/21 0815  ceFEPIme (MAXIPIME) 2 g in sodium chloride 0.9 % 100 mL IVPB  Status:  Discontinued        2 g 200 mL/hr over 30 Minutes Intravenous Every 8 hours 04/12/21 0724 04/13/21 0829   04/06/21 1800  ceFAZolin (ANCEF) IVPB 2g/100 mL premix        2 g 200 mL/hr over 30 Minutes Intravenous Every 8 hours 04/06/21 1403 04/10/21 1913   04/04/21 1000  ceFEPIme (MAXIPIME) 2 g in sodium chloride 0.9 %  100 mL IVPB  Status:  Discontinued        2 g 200 mL/hr over 30 Minutes Intravenous Every 8 hours 04/04/21 0919 04/06/21 1403      Subjective: Today, patient was seen and examined at bedside.  Patient is nonverbal.  No interval complaints reported by the nursing staff.  Temperature max at the 99.7 F.  Blood pressure slightly elevated.  Objective: Vitals:   05/10/21 0400 05/10/21 0448 05/10/21 0729 05/10/21 1000  BP: (!) 158/113  (!) 146/100 (!) 152/105  Pulse: 95  96 92  Resp: (!) 32  (!) 32 15  Temp: 99.5 F (37.5 C)  99.4 F (37.4 C) 98.7 F (37.1 C)  TempSrc: Oral  Oral Axillary  SpO2: 100%  99% 100%  Weight:  101 kg    Height:        Intake/Output Summary (Last 24 hours) at 05/10/2021 1015 Last data filed at 05/10/2021 0846 Gross per 24 hour  Intake 2356.79 ml  Output 1985 ml  Net 371.79 ml   Filed Weights   05/09/21 0300 05/10/21 0448  Weight: 98 kg 101 kg   Body mass index is 31.95 kg/m.   Physical Examination:  General: Obese built,  not in obvious distress, nonverbal, nonresponsive, opens eyes spontaneously, chronically ill and deconditioned, HENT:   No scleral pallor or icterus noted. Oral mucosa is moist.  Tracheostomy collar in place. Chest: .  Diminished breath sounds bilaterally.  CVS: S1 &S2 heard. No murmur.  Regular rate and rhythm. Abdomen: Soft, nontender, nondistended.  Bowel sounds are heard.  PEG tube in place. Extremities: No cyanosis, clubbing or edema.  Peripheral pulses are palpable.  Muscle wasting noted. Flaccidity+ Psych: Nonverbal CNS: Spontaneously opens eyes, flaccid extremities, not following commands, nonresponsive. Skin: Warm and dry.  No rashes noted.  Data Reviewed:   CBC: Recent Labs  Lab 05/07/21 1101 05/08/21 0426 05/09/21 0329 05/10/21 0406  WBC 11.5* 12.5* 11.9* 11.9*  NEUTROABS 8.0* 9.4*  --   --   HGB 11.0* 12.5* 10.9* 11.2*  HCT 34.7* 37.4* 33.1* 34.7*  MCV 95.9 94.2 94.8 95.6  PLT 178 174 181 051    Basic Metabolic Panel: Recent Labs  Lab 05/07/21 1101 05/08/21 0426 05/09/21 0329 05/10/21 0406  NA 140 140 140 141  K 3.9 4.1 3.9 3.9  CL 103 101 102 103  CO2 $Re'29 30 27 28  'rNY$ GLUCOSE 111* 102* 95 96  BUN 27* 26* 26* 27*  CREATININE 0.80 0.71 0.73 0.71  CALCIUM 8.2* 8.4* 8.2* 8.1*  MG  --   --  2.1 2.2    Liver Function Tests: Recent Labs  Lab 05/07/21 1101 05/08/21 0426  AST 28 29  ALT 23 22  ALKPHOS 38 45  BILITOT 0.4 0.5  PROT 6.3* 6.6  ALBUMIN 1.8* 1.8*     Radiology Studies: No results found.    LOS: 41 days    Flora Lipps, MD [facility] 05/10/2021, 10:15 AM

## 2021-05-10 NOTE — TOC Progression Note (Addendum)
Transition of Care (TOC) - Progression Note  ? ? ?Patient Details  ?Name: Billy Cherry ?MRN: [id number] ?Date of Birth: [birth year] ? ?Transition of Care (TOC) CM/SW Contact  ?Leone Haven, RN ?Phone Number: ?05/10/2021, 6:43 PM ? ?Clinical Narrative:    ?Per vm from [place] with Frances Furbish, he states he spoke with wife today and she shared with him that she is not comfortable moving forward with this plan to bring him home with the four 6 hour shifts a week thru his insurance policy.  She expressed that between her job and out of the home responsibility that this will not be enough service to meet his need  and she was concerned that if we went faster than that , that it would exhaust his insurance benefit and there would be this period where he would have no insurance benefit and we would still be waiting on Medicaid.  She prefers to wait to bring him home when he has active [place] Medicaid, which she is still working on this process. He stated to her that she would need to share this information with me the NCM  and also He would let me know this information as well.  ? ?This NCM will inform MD of this information and will contact wife on Friday when back in the office.  ? ? ?Expected Discharge Plan: Long Term Acute Care (LTAC) ?Barriers to Discharge: Continued Medical Work up ? ?Expected Discharge Plan and Services ?Expected Discharge Plan: Long Term Acute Care (LTAC) ?In-house Referral: Hospice / Palliative Care ?Discharge Planning Services: CM Consult ?Post Acute Care Choice: Long Term Acute Care (LTAC) ?Living arrangements for the past 2 months: Single Family Home ?                ?DME Arranged: N/A ?  ?  ?  ?  ?  ?  ?  ?  ?  ? ? ?Social Determinants of Health (SDOH) Interventions ?  ? ?Readmission Risk Interventions ?Readmission Risk Prevention Plan 04/14/2021  ?Transportation Screening Complete  ?[place] or Home Care Consult Complete  ?Social Work Consult for Recovery Care Planning/Counseling Complete   ?Palliative Care Screening Complete  ?Medication Review Oceanographer) Complete  ?Some recent data might be hidden  ? ? ?

## 2021-05-10 NOTE — Progress Notes (Signed)
[facility]   Reason for visit: Follow up on positive blood cultures  Interval History: repeat blood cultures sent and ngtd; remains afebrile; WBC 11.9  Day 9 total antibiotics  Physical Exam: Constitutional:  Vitals:   05/10/21 0400 05/10/21 0729  BP: (!) 158/113 (!) 146/100  Pulse: 95 96  Resp: (!) 32 (!) 32  Temp: 99.5 F (37.5 C) 99.4 F (37.4 C)  SpO2: 100% 99%   patient appears in NAD HENT: + trach collar Respiratory: Normal respiratory effort; CTA B Neuro: non-responsive  Review of Systems: Unable to be assessed due to patient factors  Lab Results  Component Value Date   WBC 11.9 (H) 05/10/2021   HGB 11.2 (L) 05/10/2021   HCT 34.7 (L) 05/10/2021   MCV 95.6 05/10/2021   PLT 198 05/10/2021    Lab Results  Component Value Date   CREATININE 0.71 05/10/2021   BUN 27 (H) 05/10/2021   NA 141 05/10/2021   K 3.9 05/10/2021   CL 103 05/10/2021   CO2 28 05/10/2021    Lab Results  Component Value Date   ALT 22 05/08/2021   AST 29 05/08/2021   ALKPHOS 45 05/08/2021     Microbiology: Recent Results (from the past 240 hour(s))  Culture, Respiratory w Gram Stain     Status: None   Collection Time: 05/02/21  7:13 AM   Specimen: Tracheal Aspirate; Respiratory  Result Value Ref Range Status   Specimen Description TRACHEAL ASPIRATE  Final   Special Requests NONE  Final   Gram Stain   Final    MODERATE SQUAMOUS EPITHELIAL CELLS PRESENT ABUNDANT WBC PRESENT, PREDOMINANTLY PMN FEW GRAM NEGATIVE RODS MODERATE GRAM POSITIVE COCCI    Culture   Final    FEW PSEUDOMONAS AERUGINOSA WITHIN MIXED NORMAL RESPIRATORY FLORA Performed at [facility], [address], [place], [place] [address]    Report Status 05/05/2021 FINAL  Final   Organism ID, Bacteria PSEUDOMONAS AERUGINOSA  Final      Susceptibility   Pseudomonas aeruginosa - MIC*    CEFTAZIDIME 4 SENSITIVE Sensitive     CIPROFLOXACIN <=0.25 SENSITIVE Sensitive     GENTAMICIN  <=1 SENSITIVE Sensitive     IMIPENEM 2 SENSITIVE Sensitive     PIP/TAZO 8 SENSITIVE Sensitive     CEFEPIME 2 SENSITIVE Sensitive     * FEW PSEUDOMONAS AERUGINOSA  Culture, blood (routine x 2)     Status: Abnormal   Collection Time: 05/07/21 12:30 PM   Specimen: BLOOD RIGHT HAND  Result Value Ref Range Status   Specimen Description BLOOD RIGHT HAND  Final   Special Requests   Final    BOTTLES DRAWN AEROBIC AND ANAEROBIC Blood Culture results may not be optimal due to an inadequate volume of blood received in culture bottles   Culture  Setup Time   Final    GRAM POSITIVE COCCI IN CLUSTERS IN BOTH AEROBIC AND ANAEROBIC BOTTLES CRITICAL RESULT CALLED TO, READ BACK BY AND VERIFIED WITH: PHARM D E.UELAND ON [id number] AT 1424 BY E.PARRISH    Culture (A)  Final    STAPHYLOCOCCUS EPIDERMIDIS THE SIGNIFICANCE OF ISOLATING THIS ORGANISM FROM A SINGLE SET OF BLOOD CULTURES WHEN MULTIPLE SETS ARE DRAWN IS UNCERTAIN. PLEASE NOTIFY THE MICROBIOLOGY DEPARTMENT WITHIN ONE WEEK IF SPECIATION AND SENSITIVITIES ARE REQUIRED. STAPHYLOCOCCUS CAPITIS SUSCEPTIBILITIES PERFORMED ON PREVIOUS CULTURE WITHIN THE LAST 5 DAYS. Performed at [facility], [address] [place], [place] [address]  Report Status 05/10/2021 FINAL  Final  Blood Culture ID Panel (Reflexed)     Status: Abnormal   Collection Time: 05/07/21 12:30 PM  Result Value Ref Range Status   Enterococcus faecalis NOT DETECTED NOT DETECTED Final   Enterococcus Faecium NOT DETECTED NOT DETECTED Final   Listeria monocytogenes NOT DETECTED NOT DETECTED Final   Staphylococcus species DETECTED (A) NOT DETECTED Final    Comment: CRITICAL RESULT CALLED TO, READ BACK BY AND VERIFIED WITH: Loralie ChampagneHARM D E.UELAND ON [id number] AT 1424 BY E.PARRISH    Staphylococcus aureus (BCID) NOT DETECTED NOT DETECTED Final   Staphylococcus epidermidis DETECTED (A) NOT DETECTED Final    Comment: Methicillin (oxacillin) resistant coagulase negative staphylococcus.  Possible blood culture contaminant (unless isolated from more than one blood culture draw or clinical case suggests pathogenicity). No antibiotic treatment is indicated for blood  culture contaminants. CRITICAL RESULT CALLED TO, READ BACK BY AND VERIFIED WITH: Loura BackHARM D E.UELAND ON [id number] AT 1424 BY E.PARRISH    Staphylococcus lugdunensis NOT DETECTED NOT DETECTED Final   Streptococcus species NOT DETECTED NOT DETECTED Final   Streptococcus agalactiae NOT DETECTED NOT DETECTED Final   Streptococcus pneumoniae NOT DETECTED NOT DETECTED Final   Streptococcus pyogenes NOT DETECTED NOT DETECTED Final   A.calcoaceticus-baumannii NOT DETECTED NOT DETECTED Final   Bacteroides fragilis NOT DETECTED NOT DETECTED Final   Enterobacterales NOT DETECTED NOT DETECTED Final   Enterobacter cloacae complex NOT DETECTED NOT DETECTED Final   Escherichia coli NOT DETECTED NOT DETECTED Final   Klebsiella aerogenes NOT DETECTED NOT DETECTED Final   Klebsiella oxytoca NOT DETECTED NOT DETECTED Final   Klebsiella pneumoniae NOT DETECTED NOT DETECTED Final   Proteus species NOT DETECTED NOT DETECTED Final   Salmonella species NOT DETECTED NOT DETECTED Final   Serratia marcescens NOT DETECTED NOT DETECTED Final   Haemophilus influenzae NOT DETECTED NOT DETECTED Final   Neisseria meningitidis NOT DETECTED NOT DETECTED Final   Pseudomonas aeruginosa NOT DETECTED NOT DETECTED Final   Stenotrophomonas maltophilia NOT DETECTED NOT DETECTED Final   Candida albicans NOT DETECTED NOT DETECTED Final   Candida auris NOT DETECTED NOT DETECTED Final   Candida glabrata NOT DETECTED NOT DETECTED Final   Candida krusei NOT DETECTED NOT DETECTED Final   Candida parapsilosis NOT DETECTED NOT DETECTED Final   Candida tropicalis NOT DETECTED NOT DETECTED Final   Cryptococcus neoformans/gattii NOT DETECTED NOT DETECTED Final   Methicillin resistance mecA/C DETECTED (A) NOT DETECTED Final    Comment: CRITICAL RESULT CALLED TO,  READ BACK BY AND VERIFIED WITH: Loralie ChampagneHARM D E.UELAND ON [id number] AT 1424 BY E.PARRISH Performed at [facility], [address] [place], [place] [address]   Culture, blood (routine x 2)     Status: Abnormal   Collection Time: 05/07/21 12:37 PM   Specimen: BLOOD RIGHT HAND  Result Value Ref Range Status   Specimen Description BLOOD RIGHT HAND  Final   Special Requests   Final    BOTTLES DRAWN AEROBIC ONLY Blood Culture adequate volume   Culture  Setup Time   Final    GRAM POSITIVE COCCI IN CLUSTERS AEROBIC BOTTLE ONLY CRITICAL VALUE NOTED.  VALUE IS CONSISTENT WITH PREVIOUSLY REPORTED AND CALLED VALUE. Performed at [facility], [address] [place], [place] [address]    Culture STAPHYLOCOCCUS CAPITIS (A)  Final   Report Status 05/10/2021 FINAL  Final   Organism ID, Bacteria STAPHYLOCOCCUS CAPITIS  Final      Susceptibility   Staphylococcus capitis -  MIC*    CIPROFLOXACIN >=8 RESISTANT Resistant     ERYTHROMYCIN >=8 RESISTANT Resistant     GENTAMICIN 8 INTERMEDIATE Intermediate     OXACILLIN >=4 RESISTANT Resistant     TETRACYCLINE 2 SENSITIVE Sensitive     VANCOMYCIN 1 SENSITIVE Sensitive     TRIMETH/SULFA <=10 SENSITIVE Sensitive     CLINDAMYCIN >=8 RESISTANT Resistant     RIFAMPIN <=0.5 SENSITIVE Sensitive     Inducible Clindamycin NEGATIVE Sensitive     * STAPHYLOCOCCUS CAPITIS  Culture, blood (routine x 2)     Status: None (Preliminary result)   Collection Time: 05/09/21 10:45 AM   Specimen: BLOOD RIGHT WRIST  Result Value Ref Range Status   Specimen Description BLOOD RIGHT WRIST  Final   Special Requests   Final    BOTTLES DRAWN AEROBIC ONLY Blood Culture results may not be optimal due to an inadequate volume of blood received in culture bottles   Culture   Final    NO GROWTH < 24 HOURS Performed at [facility], [address], [place], [place] [address]    Report Status PENDING  Incomplete  Culture, blood (routine x 2)     Status: None  (Preliminary result)   Collection Time: 05/09/21 10:46 AM   Specimen: BLOOD RIGHT HAND  Result Value Ref Range Status   Specimen Description BLOOD RIGHT HAND  Final   Special Requests   Final    BOTTLES DRAWN AEROBIC AND ANAEROBIC Blood Culture adequate volume   Culture   Final    NO GROWTH < 24 HOURS Performed at [facility], [address] [place], [place] [address]    Report Status PENDING  Incomplete    Impression/Plan:  1. Positive blood cultures - CoNS and no fever, no significant leukocytosis.  Most c/w a contaminate.  Repeat blood cultures sent and negative to date.  No treatment indicated unless repeat cultures positive.   2.  Possible pneumonia - CXR has been clear, clear secretions, no fever.  Positive trach aspirate with Pseudomonas and has been on prolonged ceftazidime.   Will stop antibiotics  3.  Anoxic brain injury - post arrest anoxic brain injury and no response.  Plan to go home for care.    I will sign off, call with questions

## 2021-05-10 NOTE — TOC Progression Note (Signed)
Transition of Care (TOC) - Progression Note  ? ? ?Patient Details  ?Name: Billy Cherry ?MRN: [id number] ?Date of Birth: 1970/06/30 ? ?Transition of Care (TOC) CM/SW Contact  ?Zenon Mayo, RN ?Phone Number: ?05/10/2021, 3:54 PM ? ?Clinical Narrative:    ?Per [name] with [facility], they are going to start the adult nursing when patient is ready, they have touched based with the wife. They will provide 24 hrs per week, which consist of 4 to 6 hr shifts, on Mon , Tues, Wed and FRiday.  The wife will be off on Thursday's so she will be doing the care then.  Also the wife states she may have another person (family) member to help who will need to be trained also.  TOC will continue to follow , wife will need to continue to get education ,she has been trying to come in the evening.  Enteral tube feeding order was signed by Dr. Tyrell Antonio yesterday, and it was faxed to [facility] today, but he has been switched to bolus feeds. Thedore Mins will need to do order for trach kit supplies.  ? ?Wife states she has been coming to get education ,but nursing staff says she is not staying long enough to get the education.  NCM informed staff RN to call respiratory therapy whenever wife gets here so they can come to do the education and to let wife do bolus feeds when she is here.  ?  ? ? ?Expected Discharge Plan: [place] (LTAC) ?Barriers to Discharge: Continued Medical Work up ? ?Expected Discharge Plan and Services ?Expected Discharge Plan: [place] (LTAC) ?In-house Referral: Hospice / Palliative Care ?Discharge Planning Services: CM Consult ?Post Acute Care Choice: Long Term Acute Care (LTAC) ?Living arrangements for the past 2 months: [place] ?                ?DME Arranged: N/A ?  ?  ?  ?  ?  ?  ?  ?  ?  ? ? ?Social Determinants of Health (SDOH) Interventions ?  ? ?Readmission Risk Interventions ?Readmission Risk Prevention Plan 04/14/2021  ?Transportation Screening Complete  ?[place] or Home Care Consult  Complete  ?Social Work Consult for [place] Planning/Counseling Complete  ?Palliative Care Screening Complete  ?Medication Review Press photographer) Complete  ?Some recent data might be hidden  ? ? ?

## 2021-05-11 LAB — CBC
HCT: 39.1 % (ref 39.0–52.0)
Hemoglobin: 12.3 g/dL — ABNORMAL LOW (ref 13.0–17.0)
MCH: 31.5 pg (ref 26.0–34.0)
MCHC: 31.5 g/dL (ref 30.0–36.0)
MCV: 100 fL (ref 80.0–100.0)
Platelets: 184 10*3/uL (ref 150–400)
RBC: 3.91 MIL/uL — ABNORMAL LOW (ref 4.22–5.81)
RDW: 16 % — ABNORMAL HIGH (ref 11.5–15.5)
WBC: 11.4 10*3/uL — ABNORMAL HIGH (ref 4.0–10.5)
nRBC: 0 % (ref 0.0–0.2)

## 2021-05-11 LAB — BASIC METABOLIC PANEL
Anion gap: 11 (ref 5–15)
BUN: 26 mg/dL — ABNORMAL HIGH (ref 6–20)
CO2: 27 mmol/L (ref 22–32)
Calcium: 8.4 mg/dL — ABNORMAL LOW (ref 8.9–10.3)
Chloride: 106 mmol/L (ref 98–111)
Creatinine, Ser: 0.66 mg/dL (ref 0.61–1.24)
GFR, Estimated: >60 mL/min (ref >60)
Glucose, Bld: 107 mg/dL — ABNORMAL HIGH (ref 70–99)
Potassium: 3.7 mmol/L (ref 3.5–5.1)
Sodium: 144 mmol/L (ref 135–145)

## 2021-05-11 LAB — MAGNESIUM: Magnesium: 2 mg/dL (ref 1.7–2.4)

## 2021-05-11 NOTE — Progress Notes (Signed)
PROGRESS NOTE    Billy Cherry  MCR:[id number] DOB: 1970-04-01 DOA: 03/30/2021 PCP: Patient, No Pcp Per (Inactive)    Brief Narrative:  51 years old male with past medical history of hypertension was brought into the hospital after being found unresponsive by his wife.  EMS was called in and patient was noted to be in ventricular fibrillation with agonal respirations.  Patient was given CPR, epinephrine and had return of spontaneous circulation.  EKG showed anterolateral ST elevation and was taken to Cath Lab. Dr Josefa Half performed cath> insignificant CAD and EF 45-50%. Patient remained on ventilator after cath and was admitted to the ICU. Underwent TTM.  Neurology was consulted due to rhythmic twitching of his lips tongue and was started on Keppra.  MRI of the brain showed evidence of hypoxia with bilateral ACA MCA watershed infarcts.  2D echocardiogram showed LV ejection fraction of 20% with global hypokinesis.  Patient was also treated for ventilator associated pneumonia.  At this time, patient has undergone tracheostomy and PEG tube placement.  Patient did have a spike of fever on 2/28 thought to be secondary to aspiration and was on Fortaz since respiratory culture was growing Pseudomonas.  Blood culture however grew out MRSE in 3 out of 3 bottles.  Infectious disease was consulted ,repeat blood cultures were sent and patient is being observed off antibiotic.       Assessment and Plan: * Cardiac arrest ([place]) Postarrest anoxic encephalopathy Postarrest respiratory failure s/p TC S/P PEG tube placement Postarrest myoclonus: 2D echocardiogram showed reduced LV function at 20%.  Continue tracheostomy with trach collar.  Continue PEG tube feeding.  Remains obtunded with anoxic encephalopathy-on Keppra, for myoclonus and can increase Klonopin for breakthrough myoclonus.  Continue supportive care.  At this time plan is home with home health-Home health/patient's wife and family will be taking turns  to take care of him he will need around-the-clock care.Enteral tube feeding trach kit and supplies to be  arranged.  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  RLL pneumonia Sepsis secondary to Pseudomonas pneumonia.   Previously had completed vancomycin for MSSA and [name].  Received more than 7-day course of IV Fortaz.  Infectious disease on board and patient is currently being observed without antibiotic.   Continue respiratory care scopolamine.  Patient is at a very high risk of recurrent pneumonia.   WBC at 11.4.  Temp max noted at 99.7 F.  Blood cultures have been on 05/09/2021 negative in 1 day.  Acute respiratory failure ([name]) Status post trach collar.  Continue supportive care  Staphylococcus epidermidis bacteremia MRSE.  Infectious disease was consulted.  Recommendation is to hold off with antibiotic for now and closely observe.  Repeat blood cultures have been sent on 05/09/2021, negative in 1 day  Dysphagia Continue PEG tube feeding  Obesity, Class I, BMI 30-34.9 BMI 33.  HTN (hypertension) Continue Coreg Isordil amlodipine Cardura hydralazine and PRN Labetalol.  HFrEF (heart failure with reduced ejection fraction) ([name]) 2D echocardiogram swelling LVEF less than 20%, continue coreg, imdur, amlodipine, doxazosin, hydralazine, labetalol as needed  Hypernatremia Continue free water 400 cc Q 6 hrs for now, latest sodium of 144.  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  Continue current AED with Klonopin, Keppra. per neuro notes, suppressing his myoclonus will not ultimately change the prognosis   Nutrition through the PEG tube.  Continue water flushes.   DVT prophylaxis: heparin injection 5,000 Units Start: 03/30/21 2000   Code Status:     Code Status: Full Code  Disposition: Home versus long-term care  Status is: Inpatient  Remains inpatient appropriate because: Pending clinical improvement, being observed off antibiotic, repeat blood cultures  to be followed, pending disposition plan.   Family Communication:  I spoke with the patient's wife on the phone [phone] and updated her about the clinical condition of the patient.  She is worried about the patient and the lack of support and supplies and ongoing care at home.  Transition of care on board.  Consultants:  Palliative,  ENT,  GI,  neuro,  cardiology,  PCCM,  Infectious disease  Procedures:  IR guided gastrostomy tube placement on 04/26/2021 Endotracheal tube placement mechanical ventilation. Tracheostomy   Antimicrobials:  Tressie Ellis 05/02/2021>3/8 Vancomycin 05/08/21>05/09/21  Anti-infectives (From admission, onward)    Start     Dose/Rate Route Frequency Ordered Stop   05/09/21 0800  vancomycin (VANCOREADY) IVPB 1250 mg/250 mL  Status:  Discontinued        1,250 mg 166.7 mL/hr over 90 Minutes Intravenous Every 12 hours 05/08/21 1621 05/09/21 0954   05/08/21 1715  vancomycin (VANCOREADY) IVPB 2000 mg/400 mL        2,000 mg 200 mL/hr over 120 Minutes Intravenous  Once 05/08/21 1618 05/08/21 1943   05/02/21 1000  cefTAZidime (FORTAZ) 2 g in sodium chloride 0.9 % 100 mL IVPB  Status:  Discontinued        2 g 200 mL/hr over 30 Minutes Intravenous Every 8 hours 05/02/21 0846 05/10/21 0936   04/26/21 0756  ceFAZolin (ANCEF) 2-4 GM/100ML-% IVPB  Status:  Discontinued       Note to Pharmacy: [name] L: cabinet override      04/26/21 0756 04/26/21 0855   04/26/21 0600  ceFAZolin (ANCEF) IVPB 2g/100 mL premix        2 g 200 mL/hr over 30 Minutes Intravenous Every 8 hours 04/25/21 1131 04/26/21 0915   04/23/21 1400  cefTAZidime (FORTAZ) 1 g in sodium chloride 0.9 % 100 mL IVPB        1 g 200 mL/hr over 30 Minutes Intravenous Every 8 hours 04/23/21 1256 04/25/21 2331   04/20/21 1300  ceFEPIme (MAXIPIME) 2 g in sodium chloride 0.9 % 100 mL IVPB  Status:  Discontinued        2 g 200 mL/hr over 30 Minutes Intravenous Every 8 hours 04/20/21 1205 04/23/21 1241   04/14/21 0930   vancomycin (VANCOREADY) IVPB 1250 mg/250 mL        1,250 mg 166.7 mL/hr over 90 Minutes Intravenous Every 24 hours 04/13/21 0902 04/19/21 1247   04/13/21 0930  vancomycin (VANCOREADY) IVPB 2000 mg/400 mL        2,000 mg 200 mL/hr over 120 Minutes Intravenous  Once 04/13/21 0831 04/13/21 1158   04/12/21 0815  ceFEPIme (MAXIPIME) 2 g in sodium chloride 0.9 % 100 mL IVPB  Status:  Discontinued        2 g 200 mL/hr over 30 Minutes Intravenous Every 8 hours 04/12/21 0724 04/13/21 0829   04/06/21 1800  ceFAZolin (ANCEF) IVPB 2g/100 mL premix        2 g 200 mL/hr over 30 Minutes Intravenous Every 8 hours 04/06/21 1403 04/10/21 1913   04/04/21 1000  ceFEPIme (MAXIPIME) 2 g in sodium chloride 0.9 % 100 mL IVPB  Status:  Discontinued        2 g 200 mL/hr over 30 Minutes Intravenous Every 8 hours 04/04/21 0919 04/06/21 1403      Subjective: Today, patient was seen and  examined.  Nonverbal.  No interval complaints reported.  Objective: Vitals:   05/11/21 0000 05/11/21 0340 05/11/21 0400 05/11/21 0700  BP: 113/78  (!) 161/111 118/81  Pulse: 83 83 84 78  Resp: (!) $RemoveB'21 15 19 'iThsDYBk$ (!) 24  Temp: 99 F (37.2 C)  98.8 F (37.1 C) 98.5 F (36.9 C)  TempSrc: Oral  Oral Oral  SpO2: 100% 100% 100% 99%  Weight:   99 kg   Height:        Intake/Output Summary (Last 24 hours) at 05/11/2021 1240 Last data filed at 05/11/2021 0935 Gross per 24 hour  Intake --  Output 1400 ml  Net -1400 ml   Filed Weights   05/09/21 0300 05/10/21 0448 05/11/21 0400  Weight: 98 kg 101 kg 99 kg   Body mass index is 31.32 kg/m.   Physical Examination:  General: Obese built, not in obvious distress, nonresponsive, nonverbal, opens eyes spontaneously, chronically ill and deconditioned. HENT:   No scleral pallor or icterus noted. Oral mucosa is moist.  Trach collar in Place. Chest:   Diminished breath sounds bilaterally.  CVS: S1 &S2 heard. No murmur.  Regular rate and rhythm. Abdomen: Soft, nontender, nondistended.   Bowel sounds are heard.  PEG tube in place. Extremities: No cyanosis, clubbing or edema.  Peripheral pulses are palpable.  Muscle wasting noted with flaccidity. Psych: Nonverbal nonresponsive, flaccid extremities. CNS: Nonverbal, nonresponsive, flaccid extremities. Skin: Warm and dry.  No rashes noted.   CBC: Recent Labs  Lab 05/07/21 1101 05/08/21 0426 05/09/21 0329 05/10/21 0406 05/11/21 0455  WBC 11.5* 12.5* 11.9* 11.9* 11.4*  NEUTROABS 8.0* 9.4*  --   --   --   HGB 11.0* 12.5* 10.9* 11.2* 12.3*  HCT 34.7* 37.4* 33.1* 34.7* 39.1  MCV 95.9 94.2 94.8 95.6 100.0  PLT 178 174 181 198 292    Basic Metabolic Panel: Recent Labs  Lab 05/07/21 1101 05/08/21 0426 05/09/21 0329 05/10/21 0406 05/11/21 0455  NA 140 140 140 141 144  K 3.9 4.1 3.9 3.9 3.7  CL 103 101 102 103 106  CO2 $Re'29 30 27 28 27  'HLC$ GLUCOSE 111* 102* 95 96 107*  BUN 27* 26* 26* 27* 26*  CREATININE 0.80 0.71 0.73 0.71 0.66  CALCIUM 8.2* 8.4* 8.2* 8.1* 8.4*  MG  --   --  2.1 2.2 2.0    Liver Function Tests: Recent Labs  Lab 05/07/21 1101 05/08/21 0426  AST 28 29  ALT 23 22  ALKPHOS 38 45  BILITOT 0.4 0.5  PROT 6.3* 6.6  ALBUMIN 1.8* 1.8*     Radiology Studies: No results found.    LOS: 42 days    Flora Lipps, MD [facility] 05/11/2021, 12:40 PM

## 2021-05-11 NOTE — Plan of Care (Signed)
  Problem: Health Behavior/Discharge Planning: Goal: Ability to manage health-related needs will improve Outcome: Progressing   Problem: Nutrition: Goal: Adequate nutrition will be maintained Outcome: Progressing   Problem: Coping: Goal: Level of anxiety will decrease Outcome: Progressing   

## 2021-05-12 DIAGNOSIS — Z9289 Personal history of other medical treatment: Secondary | ICD-10-CM

## 2021-05-12 DIAGNOSIS — R509 Fever, unspecified: Secondary | ICD-10-CM

## 2021-05-12 DIAGNOSIS — B957 Other staphylococcus as the cause of diseases classified elsewhere: Secondary | ICD-10-CM

## 2021-05-12 MED ORDER — LEVETIRACETAM 100 MG/ML PO SOLN
750.0000 mg | Freq: Two times a day (BID) | ORAL | Status: DC
  Administered 2021-05-12 – 2021-06-25 (×88): 750 mg via ORAL
  Filled 2021-05-12: qty 10
  Filled 2021-05-12: qty 7.5
  Filled 2021-05-12 (×69): qty 10
  Filled 2021-05-12: qty 7.5
  Filled 2021-05-12 (×18): qty 10

## 2021-05-12 MED ORDER — CHLORHEXIDINE GLUCONATE 0.12 % MT SOLN
OROMUCOSAL | Status: AC
  Administered 2021-05-12: 15 mL via OROMUCOSAL
  Filled 2021-05-12: qty 15

## 2021-05-12 NOTE — TOC Progression Note (Signed)
Transition of Care (TOC) - Progression Note  ? ? ?Patient Details  ?Name: Billy Cherry ?MRN: [id number] ?Date of Birth: 21-Jul-1970 ? ?Transition of Care (TOC) CM/SW Contact  ?Leone Haven, RN ?Phone Number: ?05/12/2021, 4:37 PM ? ?Clinical Narrative:    ?NCM left message with financial counselor for a return call.  Trying to see where they are with the Medicaid.  ? ? ?Expected Discharge Plan: Long Term Acute Care (LTAC) ?Barriers to Discharge: Continued Medical Work up ? ?Expected Discharge Plan and Services ?Expected Discharge Plan: Long Term Acute Care (LTAC) ?In-house Referral: Hospice / Palliative Care ?Discharge Planning Services: CM Consult ?Post Acute Care Choice: Long Term Acute Care (LTAC) ?Living arrangements for the past 2 months: Single Family Home ?                ?DME Arranged: N/A ?  ?  ?  ?  ?  ?  ?  ?  ?  ? ? ?Social Determinants of Health (SDOH) Interventions ?  ? ?Readmission Risk Interventions ?Readmission Risk Prevention Plan 04/14/2021  ?Transportation Screening Complete  ?HRI or Home Care Consult Complete  ?Social Work Consult for Recovery Care Planning/Counseling Complete  ?Palliative Care Screening Complete  ?Medication Review Oceanographer) Complete  ?Some recent data might be hidden  ? ? ?

## 2021-05-12 NOTE — Progress Notes (Addendum)
Speech Language Pathology Treatment: Billy Cherry Speaking valve  ?Patient Details ?Name: Billy Cherry ?MRN: [id number] ?DOB: 1970/12/26 ?Today's Date: 05/12/2021 ?Time: 1252-1300 ?SLP Time Calculation (min) (ACUTE ONLY): 8 min ? ?Assessment / Plan / Recommendation ?Clinical Impression ? Pt seen for PMV treatment in his usual state of non responsiveness due to severe anoxic brain injury. At baseline his respirations were 28-30, HR 92. SpO2 remained 95%. SLP cleaned expectorated mucous from around trach hub on arrival. Valve placed on cuffless trach and RR up to 30 with mildly increased work of breathing. Pt with weak reflexive non productive cough; no spontaneous vocalizations present but able to direct air to upper airway.  ?Note from OT states pt's wife scheduled to come for training with PT/OT on afternoon of Sunday 3/13 which has been rescheduled several times. Will continue efforts for family education. ?  ?HPI HPI: Pt is a 51 y.o. male who presented 03/23/21 as a code STEMI after being found unresponsive. V. fib cardiac arrest in setting of respiratory arrest due to suspected angioedema from ACE inhibitor leading to asphyxiation.  Concern on his 12 lead EKG for anterolateral ST elevation so he was taken to the cath lab where his study showed no significant coronary artery disease and an LVEF < 20%. 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 S/p cortak placement 1/27, trach placement 2/11; downsized to 6 cuffless.  ETT 1/19 - 2/11. PMH: HTN ?  ?   ?SLP Plan ? Continue with current plan of care ? ?  ?  ?Recommendations for follow up therapy are one component of a multi-disciplinary discharge planning process, led by the attending physician.  Recommendations may be updated based on patient status, additional functional criteria and insurance authorization. ?  ? ?Recommendations  ?   ?   ? Patient may use  Passy-Muir Speech Valve: with SLP only ?PMSV Supervision: Full  ?   ? ? ? ? Oral Care Recommendations: Oral care QID ?Follow Up Recommendations: SLP at Long-term acute care hospital ?Assistance recommended at discharge: Frequent or constant Supervision/Assistance ?SLP Visit Diagnosis: Aphonia (R49.1) ?Plan: Continue with current plan of care ? ? ? ? ?  ?  ? ? ?Royce Macadamia ? ?05/12/2021, 1:56 PM ?

## 2021-05-12 NOTE — Progress Notes (Signed)
PROGRESS NOTE    Billy Cherry  WVP:[id number] DOB: 02/06/1971 DOA: 03/30/2021 PCP: Patient, No Pcp Per (Inactive)    Brief Narrative:  51 years old male with past medical history of hypertension was brought into the hospital after being found unresponsive by his wife.  EMS was called in and patient was noted to be in ventricular fibrillation with agonal respirations.  Patient was given CPR, epinephrine and had return of spontaneous circulation.  EKG showed anterolateral ST elevation and was taken to Cath Lab. Dr Josefa Half performed cath> insignificant CAD and EF 45-50%. Patient remained on ventilator after cath and was admitted to the ICU. Underwent TTM.  Neurology was consulted due to rhythmic twitching of his lips tongue and was started on Keppra.  MRI of the brain showed evidence of hypoxia with bilateral ACA MCA watershed infarcts.  2D echocardiogram showed LV ejection fraction of 20% with global hypokinesis.  Patient was also treated for ventilator associated pneumonia.  At this time, patient has undergone tracheostomy and PEG tube placement.  Patient did have a spike of fever on 2/28 thought to be secondary to aspiration and was on Fortaz since respiratory culture was growing Pseudomonas.  Blood culture however grew out MRSE in 3 out of 3 bottles.  Infectious disease was consulted ,repeat blood cultures were sent and patient is being observed off antibiotic.       Assessment and Plan: * Cardiac arrest ([place]) Postarrest anoxic encephalopathy Postarrest respiratory failure s/p TC S/P PEG tube placement Postarrest myoclonus: 2D echocardiogram showed reduced LV function at 20%.  Continue tracheostomy with trach collar.  Continue PEG tube feeding.  Remains obtunded with anoxic encephalopathy-on Keppra, for myoclonus and can increase Klonopin for breakthrough myoclonus.  Continue supportive care.  At this time plan is home with home health-Home health/patient's wife and family will be taking turns  to take care of him he will need around-the-clock care.Enteral tube feeding trach kit and supplies to be  arranged.  Anoxic encephalopathy ([place]) Continue supportive care.  No improvement noted.  Barely responsive to deep painful stimulus.  RLL pneumonia Sepsis secondary to Pseudomonas pneumonia.   Previously had completed vancomycin for MSSA and [name].  Received more than 7-day course of IV Fortaz.  Infectious disease on board and patient is currently being observed without antibiotic.  Temperature max of 100.5 F again.  Continue respiratory care scopolamine.  Patient is at a very high risk of recurrent pneumonia.   WBC at 11.4. Blood cultures from 05/09/2021 negative in 2 days.  Acute respiratory failure ([name]) Status post trach collar.  Continue supportive care  Staphylococcus epidermidis bacteremia MRSE.  Infectious disease was consulted.  Recommendation is to hold off with antibiotic for now and closely observe.  Repeat blood cultures have been sent on 05/09/2021, negative in 2 days  Dysphagia Continue PEG tube feeding.  Tolerating.  Obesity, Class I, BMI 30-34.9 BMI 33.  HTN (hypertension) Continue Coreg Isordil amlodipine Cardura hydralazine and PRN Labetalol.  HFrEF (heart failure with reduced ejection fraction) ([name]) 2D echocardiogram swelling LVEF less than 20%, continue coreg, imdur, amlodipine, doxazosin, hydralazine, labetalol as needed  Hypernatremia Continue free water 400 cc Q 6 hrs for now, latest sodium of 144.  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  Continue current AED with Klonopin, Keppra. per neuro notes, suppressing his myoclonus will not ultimately change the prognosis   Nutrition through the PEG tube.  Continue water flushes.   DVT prophylaxis: heparin injection 5,000 Units Start: 03/30/21 2000   Code  Status:     Code Status: Full Code  Disposition: Home versus long-term care  Status is: Inpatient  Remains inpatient appropriate because:  Pending clinical improvement, continues to have low-grade fever, being observed off antibiotic,  pending disposition plan.   Family Communication:  I spoke with the patient's wife on the phone 05/10/21 and updated her about the clinical condition of the patient.  She is worried about the patient and the lack of support and supplies and ongoing care at home.  Transition of care on board.  Consultants:  Palliative,  ENT,  GI,  neuro,  cardiology,  PCCM,  Infectious disease  Procedures:  IR guided gastrostomy tube placement on 04/26/2021 Endotracheal tube placement mechanical ventilation. Tracheostomy   Antimicrobials:  Tressie Ellis 05/02/2021>3/8 Vancomycin 05/08/21>05/09/21  Anti-infectives (From admission, onward)    Start     Dose/Rate Route Frequency Ordered Stop   05/09/21 0800  vancomycin (VANCOREADY) IVPB 1250 mg/250 mL  Status:  Discontinued        1,250 mg 166.7 mL/hr over 90 Minutes Intravenous Every 12 hours 05/08/21 1621 05/09/21 0954   05/08/21 1715  vancomycin (VANCOREADY) IVPB 2000 mg/400 mL        2,000 mg 200 mL/hr over 120 Minutes Intravenous  Once 05/08/21 1618 05/08/21 1943   05/02/21 1000  cefTAZidime (FORTAZ) 2 g in sodium chloride 0.9 % 100 mL IVPB  Status:  Discontinued        2 g 200 mL/hr over 30 Minutes Intravenous Every 8 hours 05/02/21 0846 05/10/21 0936   04/26/21 0756  ceFAZolin (ANCEF) 2-4 GM/100ML-% IVPB  Status:  Discontinued       Note to Pharmacy: [name] L: cabinet override      04/26/21 0756 04/26/21 0855   04/26/21 0600  ceFAZolin (ANCEF) IVPB 2g/100 mL premix        2 g 200 mL/hr over 30 Minutes Intravenous Every 8 hours 04/25/21 1131 04/26/21 0915   04/23/21 1400  cefTAZidime (FORTAZ) 1 g in sodium chloride 0.9 % 100 mL IVPB        1 g 200 mL/hr over 30 Minutes Intravenous Every 8 hours 04/23/21 1256 04/25/21 2331   04/20/21 1300  ceFEPIme (MAXIPIME) 2 g in sodium chloride 0.9 % 100 mL IVPB  Status:  Discontinued        2 g 200 mL/hr over  30 Minutes Intravenous Every 8 hours 04/20/21 1205 04/23/21 1241   04/14/21 0930  vancomycin (VANCOREADY) IVPB 1250 mg/250 mL        1,250 mg 166.7 mL/hr over 90 Minutes Intravenous Every 24 hours 04/13/21 0902 04/19/21 1247   04/13/21 0930  vancomycin (VANCOREADY) IVPB 2000 mg/400 mL        2,000 mg 200 mL/hr over 120 Minutes Intravenous  Once 04/13/21 0831 04/13/21 1158   04/12/21 0815  ceFEPIme (MAXIPIME) 2 g in sodium chloride 0.9 % 100 mL IVPB  Status:  Discontinued        2 g 200 mL/hr over 30 Minutes Intravenous Every 8 hours 04/12/21 0724 04/13/21 0829   04/06/21 1800  ceFAZolin (ANCEF) IVPB 2g/100 mL premix        2 g 200 mL/hr over 30 Minutes Intravenous Every 8 hours 04/06/21 1403 04/10/21 1913   04/04/21 1000  ceFEPIme (MAXIPIME) 2 g in sodium chloride 0.9 % 100 mL IVPB  Status:  Discontinued        2 g 200 mL/hr over 30 Minutes Intravenous Every 8 hours 04/04/21 0919 04/06/21 1403  Subjective: Today, patient was seen and examined at bedside.  Low-grade fever noted.  Patient is nonverbal.  No interval complaints reported by the nursing staff.    Objective: Vitals:   05/12/21 0426 05/12/21 0500 05/12/21 0729 05/12/21 0824  BP:   (!) 142/100 124/88  Pulse: 87  86 88  Resp:  (!) 25 (!) 23 (!) 25  Temp: 99.9 F (37.7 C)  99.8 F (37.7 C)   TempSrc: Oral  Oral   SpO2:   100% 100%  Weight:      Height:        Intake/Output Summary (Last 24 hours) at 05/12/2021 1024 Last data filed at 05/12/2021 0006 Gross per 24 hour  Intake --  Output 1100 ml  Net -1100 ml   Filed Weights   05/10/21 0448 05/11/21 0400 05/12/21 0423  Weight: 101 kg 99 kg 99 kg   Body mass index is 31.32 kg/m.   Physical Examination:  General: Obese built, not in obvious distress, nonresponsive, nonverbal, opens eyes spontaneously, chronically ill and deconditioned.   HENT:   No scleral pallor or icterus noted. Oral mucosa is moist.  Trach collar in place. Chest:    Diminished breath  sounds bilaterally. No crackles or wheezes.  CVS: S1 &S2 heard. No murmur.  Regular rate and rhythm. Abdomen: Soft, nontender, nondistended.  Bowel sounds are heard.  PEG tube in place. Extremities: No cyanosis, clubbing or edema.  Muscle wasting noted.  Flaccid extremities.  Peripheral pulses are palpable. Psych: Nonverbal, nonresponsive CNS: Nonverbal, nonresponsive, flaccid extremities Skin: Warm and dry.  No rashes noted.   CBC: Recent Labs  Lab 05/07/21 1101 05/08/21 0426 05/09/21 0329 05/10/21 0406 05/11/21 0455  WBC 11.5* 12.5* 11.9* 11.9* 11.4*  NEUTROABS 8.0* 9.4*  --   --   --   HGB 11.0* 12.5* 10.9* 11.2* 12.3*  HCT 34.7* 37.4* 33.1* 34.7* 39.1  MCV 95.9 94.2 94.8 95.6 100.0  PLT 178 174 181 198 938    Basic Metabolic Panel: Recent Labs  Lab 05/07/21 1101 05/08/21 0426 05/09/21 0329 05/10/21 0406 05/11/21 0455  NA 140 140 140 141 144  K 3.9 4.1 3.9 3.9 3.7  CL 103 101 102 103 106  CO2 $Re'29 30 27 28 27  'kUt$ GLUCOSE 111* 102* 95 96 107*  BUN 27* 26* 26* 27* 26*  CREATININE 0.80 0.71 0.73 0.71 0.66  CALCIUM 8.2* 8.4* 8.2* 8.1* 8.4*  MG  --   --  2.1 2.2 2.0    Liver Function Tests: Recent Labs  Lab 05/07/21 1101 05/08/21 0426  AST 28 29  ALT 23 22  ALKPHOS 38 45  BILITOT 0.4 0.5  PROT 6.3* 6.6  ALBUMIN 1.8* 1.8*     Radiology Studies: No results found.    LOS: 35 days    Flora Lipps, MD [facility] 05/12/2021, 10:24 AM

## 2021-05-12 NOTE — Progress Notes (Signed)
Emergency planning/management officer dropped of "Notice of Hearing on Incompetence & Order Appointing Guardian AD Litem" at bedside... copy given to Nurse secretary so she can scan and place in shadow chart.  ?

## 2021-05-13 MED ORDER — CHLORHEXIDINE GLUCONATE 0.12 % MT SOLN
OROMUCOSAL | Status: AC
  Administered 2021-05-13: 15 mL via OROMUCOSAL
  Filled 2021-05-13: qty 15

## 2021-05-13 MED ORDER — HYDRALAZINE HCL 50 MG PO TABS
100.0000 mg | ORAL_TABLET | Freq: Three times a day (TID) | ORAL | Status: DC
  Administered 2021-05-13 – 2021-06-11 (×86): 100 mg
  Filled 2021-05-13 (×87): qty 2

## 2021-05-13 NOTE — Plan of Care (Signed)
  Problem: Elimination: Goal: Will not experience complications related to bowel motility Outcome: Progressing   

## 2021-05-13 NOTE — Progress Notes (Signed)
PROGRESS NOTE    Billy Cherry  BPZ:[id number] DOB: 1970-05-11 DOA: 03/30/2021 PCP: Patient, No Pcp Per (Inactive)    Brief Narrative:  51 years old male with past medical history of hypertension was brought into the hospital after being found unresponsive by his wife.  EMS was called in and patient was noted to be in ventricular fibrillation with agonal respirations.  Patient was given CPR, epinephrine and had return of spontaneous circulation.  EKG showed anterolateral ST elevation and was taken to Cath Lab. Dr Josefa Half performed cath> insignificant CAD and EF 45-50%. Patient remained on ventilator after cath and was admitted to the ICU. Underwent TTM.  Neurology was consulted due to rhythmic twitching of his lips tongue and was started on Keppra.  MRI of the brain showed evidence of hypoxia with bilateral ACA MCA watershed infarcts.  2D echocardiogram showed LV ejection fraction of 20% with global hypokinesis.  Patient was also treated for ventilator associated pneumonia.  At this time, patient has undergone tracheostomy and PEG tube placement.  Patient did have a spike of fever on 2/28 thought to be secondary to aspiration and was on Fortaz since respiratory culture was growing Pseudomonas.  Blood culture however grew out MRSE in 3 out of 3 bottles.  Infectious disease was consulted ,repeat blood cultures were sent and patient is being observed off antibiotic.  Patient does  continue to have low-grade fever during hospitalization.     Assessment and Plan: * Cardiac arrest ([place]) Postarrest anoxic encephalopathy Postarrest respiratory failure s/p TC S/P PEG tube placement Postarrest myoclonus: 2D echocardiogram showed reduced LV function at 20%.  Continue tracheostomy with trach collar.  Continue PEG tube feeding.  Remains obtunded with anoxic encephalopathy-on Keppra, for myoclonus and can increase Klonopin for breakthrough myoclonus.  Continue supportive care.  At this time plan is home with  home health-Home health/patient's wife and family will be taking turns to take care of him he will need around-the-clock care.Enteral tube feeding trach kit and supplies to be  arranged.  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  Patient is unresponsive and has shown no improvement.  RLL pneumonia With low-grade fever.  Sepsis secondary to Pseudomonas pneumonia.   Previously had completed vancomycin for MSSA and [name].  Received more than 7-day course of IV Fortaz.  Infectious disease on board and patient is currently being observed without antibiotic.  Temperature max of 100.2 F.  Continue respiratory care scopolamine.  Patient is at a very high risk of recurrent pneumonia.   WBC at 11.4. Blood cultures from 05/09/2021 negative in 4 days.  Acute respiratory failure ([name]) Status post trach collar.  Continue supportive care  Staphylococcus epidermidis bacteremia MRSE.  Infectious disease was consulted.  Recommendation is to hold off with antibiotic for now and closely observe.  Repeat blood cultures have been sent on 05/09/2021, negative in 4 days  Dysphagia Continue PEG tube feeding.  Tolerating.  Obesity, Class I, BMI 30-34.9 BMI 33.  HTN (hypertension) Continue Coreg Isordil amlodipine Cardura hydralazine and PRN Labetalol.  Blood pressure seems to be elevated recently.  We will increase the dose of hydralazine to 100 mg 3 times daily.  HFrEF (heart failure with reduced ejection fraction) ([place]) 2D echocardiogram-LVEF less than 20%, continue coreg, imdur, amlodipine, doxazosin, hydralazine, labetalol as needed  Hypernatremia Continue free water 400 cc Q 6 hrs for now, latest sodium of 144.  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  Continue current AED with Klonopin, Keppra. per neuro notes, suppressing his myoclonus will  not ultimately change the prognosis   Nutrition through the PEG tube.  Continue water flushes.   DVT prophylaxis: heparin injection 5,000  Units Start: 03/30/21 2000   Code Status:     Code Status: Full Code  Disposition: Home versus long-term care  Status is: Inpatient  Remains inpatient appropriate because: Pending clinical improvement, continues to have low-grade fever, being observed off antibiotic,  pending disposition plan.   Family Communication:  I spoke with the patient's wife on the phone 05/10/21 and updated her about the clinical condition of the patient.    Consultants:  Palliative,  ENT,  GI,  neuro,  cardiology,  PCCM,  Infectious disease  Procedures:  IR guided gastrostomy tube placement on 04/26/2021 Endotracheal tube placement mechanical ventilation. Tracheostomy   Antimicrobials:  Tressie Ellis 05/02/2021>3/8 Vancomycin 05/08/21>05/09/21  Anti-infectives (From admission, onward)    Start     Dose/Rate Route Frequency Ordered Stop   05/09/21 0800  vancomycin (VANCOREADY) IVPB 1250 mg/250 mL  Status:  Discontinued        1,250 mg 166.7 mL/hr over 90 Minutes Intravenous Every 12 hours 05/08/21 1621 05/09/21 0954   05/08/21 1715  vancomycin (VANCOREADY) IVPB 2000 mg/400 mL        2,000 mg 200 mL/hr over 120 Minutes Intravenous  Once 05/08/21 1618 05/08/21 1943   05/02/21 1000  cefTAZidime (FORTAZ) 2 g in sodium chloride 0.9 % 100 mL IVPB  Status:  Discontinued        2 g 200 mL/hr over 30 Minutes Intravenous Every 8 hours 05/02/21 0846 05/10/21 0936   04/26/21 0756  ceFAZolin (ANCEF) 2-4 GM/100ML-% IVPB  Status:  Discontinued       Note to Pharmacy: [name] L: cabinet override      04/26/21 0756 04/26/21 0855   04/26/21 0600  ceFAZolin (ANCEF) IVPB 2g/100 mL premix        2 g 200 mL/hr over 30 Minutes Intravenous Every 8 hours 04/25/21 1131 04/26/21 0915   04/23/21 1400  cefTAZidime (FORTAZ) 1 g in sodium chloride 0.9 % 100 mL IVPB        1 g 200 mL/hr over 30 Minutes Intravenous Every 8 hours 04/23/21 1256 04/25/21 2331   04/20/21 1300  ceFEPIme (MAXIPIME) 2 g in sodium chloride 0.9 % 100 mL  IVPB  Status:  Discontinued        2 g 200 mL/hr over 30 Minutes Intravenous Every 8 hours 04/20/21 1205 04/23/21 1241   04/14/21 0930  vancomycin (VANCOREADY) IVPB 1250 mg/250 mL        1,250 mg 166.7 mL/hr over 90 Minutes Intravenous Every 24 hours 04/13/21 0902 04/19/21 1247   04/13/21 0930  vancomycin (VANCOREADY) IVPB 2000 mg/400 mL        2,000 mg 200 mL/hr over 120 Minutes Intravenous  Once 04/13/21 0831 04/13/21 1158   04/12/21 0815  ceFEPIme (MAXIPIME) 2 g in sodium chloride 0.9 % 100 mL IVPB  Status:  Discontinued        2 g 200 mL/hr over 30 Minutes Intravenous Every 8 hours 04/12/21 0724 04/13/21 0829   04/06/21 1800  ceFAZolin (ANCEF) IVPB 2g/100 mL premix        2 g 200 mL/hr over 30 Minutes Intravenous Every 8 hours 04/06/21 1403 04/10/21 1913   04/04/21 1000  ceFEPIme (MAXIPIME) 2 g in sodium chloride 0.9 % 100 mL IVPB  Status:  Discontinued        2 g 200 mL/hr over 30 Minutes Intravenous Every  8 hours 04/04/21 0919 04/06/21 1403      Subjective: Today, patient was seen and examined.  Continues to have low-grade fever.  Nonverbal.  No interval complaints reported.  Tolerating tube feeding.    Objective: Vitals:   05/13/21 0015 05/13/21 0219 05/13/21 0508 05/13/21 0735  BP:    (!) 158/103  Pulse: 91 89  90  Resp: (!) 30 (!) 28  (!) 24  Temp:   100.1 F (37.8 C) 100.2 F (37.9 C)  TempSrc:   Oral Oral  SpO2: 100% 100%  96%  Weight:      Height:        Intake/Output Summary (Last 24 hours) at 05/13/2021 0946 Last data filed at 05/13/2021 0738 Gross per 24 hour  Intake 705 ml  Output 1450 ml  Net -745 ml    Filed Weights   05/10/21 0448 05/11/21 0400 05/12/21 0423  Weight: 101 kg 99 kg 99 kg   Body mass index is 31.32 kg/m.   Physical Examination:  General: Obese built, not in obvious distress, nonresponsive, nonverbal, chronically ill and deconditioned, opens eyes spontaneously. HENT:   No scleral pallor or icterus noted. Oral mucosa is moist.   Trach collar in place Chest:   Diminished breath sounds bilaterally.  Coarse breath sounds noted. CVS: S1 &S2 heard. No murmur.  Regular rate and rhythm. Abdomen: Soft, nontender, nondistended.  Bowel sounds are heard.  PEG tube in place. Extremities: No cyanosis, clubbing or edema.   Psych: Unresponsive CNS: Nonverbal, nonresponsive, flaccid extremities. Skin: Warm and dry.  No rashes noted.  CBC: Recent Labs  Lab 05/07/21 1101 05/08/21 0426 05/09/21 0329 05/10/21 0406 05/11/21 0455  WBC 11.5* 12.5* 11.9* 11.9* 11.4*  NEUTROABS 8.0* 9.4*  --   --   --   HGB 11.0* 12.5* 10.9* 11.2* 12.3*  HCT 34.7* 37.4* 33.1* 34.7* 39.1  MCV 95.9 94.2 94.8 95.6 100.0  PLT 178 174 181 198 184     Basic Metabolic Panel: Recent Labs  Lab 05/07/21 1101 05/08/21 0426 05/09/21 0329 05/10/21 0406 05/11/21 0455  NA 140 140 140 141 144  K 3.9 4.1 3.9 3.9 3.7  CL 103 101 102 103 106  CO2 _0 GLUCOSE 111* 102* 95 96 107*  BUN 27* 26* 26* 27* 26*  CREATININE 0.80 0.71 0.73 0.71 0.66  CALCIUM 8.2* 8.4* 8.2* 8.1* 8.4*  MG  --   --  2.1 2.2 2.0     Liver Function Tests: Recent Labs  Lab 05/07/21 1101 05/08/21 0426  AST 28 29  ALT 23 22  ALKPHOS 38 45  BILITOT 0.4 0.5  PROT 6.3* 6.6  ALBUMIN 1.8* 1.8*      Radiology Studies: No results found.    LOS: 43 days    Flora Lipps, MD [facility] 05/13/2021, 9:46 AM

## 2021-05-14 LAB — CBC
HCT: 34.1 % — ABNORMAL LOW (ref 39.0–52.0)
Hemoglobin: 11.4 g/dL — ABNORMAL LOW (ref 13.0–17.0)
MCH: 31.8 pg (ref 26.0–34.0)
MCHC: 33.4 g/dL (ref 30.0–36.0)
MCV: 95 fL (ref 80.0–100.0)
Platelets: 231 10*3/uL (ref 150–400)
RBC: 3.59 MIL/uL — ABNORMAL LOW (ref 4.22–5.81)
RDW: 15.4 % (ref 11.5–15.5)
WBC: 10.3 10*3/uL (ref 4.0–10.5)
nRBC: 0 % (ref 0.0–0.2)

## 2021-05-14 LAB — BASIC METABOLIC PANEL
Anion gap: 6 (ref 5–15)
BUN: 21 mg/dL — ABNORMAL HIGH (ref 6–20)
CO2: 31 mmol/L (ref 22–32)
Calcium: 8.2 mg/dL — ABNORMAL LOW (ref 8.9–10.3)
Chloride: 98 mmol/L (ref 98–111)
Creatinine, Ser: 0.67 mg/dL (ref 0.61–1.24)
GFR, Estimated: >60 mL/min (ref >60)
Glucose, Bld: 98 mg/dL (ref 70–99)
Potassium: 4.4 mmol/L (ref 3.5–5.1)
Sodium: 135 mmol/L (ref 135–145)

## 2021-05-14 LAB — CULTURE, BLOOD (ROUTINE X 2)
Culture: NO GROWTH
Culture: NO GROWTH
Special Requests: ADEQUATE

## 2021-05-14 LAB — MAGNESIUM: Magnesium: 2 mg/dL (ref 1.7–2.4)

## 2021-05-14 NOTE — Plan of Care (Signed)
?  Problem: Clinical Measurements: ?Goal: Will remain free from infection ?Outcome: Progressing ?Goal: Diagnostic test results will improve ?Outcome: Progressing ?Goal: Respiratory complications will improve ?Outcome: Progressing ?  ?Problem: Nutrition: ?Goal: Adequate nutrition will be maintained ?Outcome: Progressing ?  ?Problem: Coping: ?Goal: Level of anxiety will decrease ?Outcome: Progressing ?  ?Problem: Elimination: ?Goal: Will not experience complications related to bowel motility ?Outcome: Progressing ?Goal: Will not experience complications related to urinary retention ?Outcome: Progressing ?  ?Problem: Pain Managment: ?Goal: General experience of comfort will improve ?Outcome: Progressing ?  ?Problem: Safety: ?Goal: Ability to remain free from injury will improve ?Outcome: Progressing ?  ?Problem: Skin Integrity: ?Goal: Risk for impaired skin integrity will decrease ?Outcome: Progressing ?  ?

## 2021-05-14 NOTE — Progress Notes (Signed)
?   05/14/21 0321  ?Assess: MEWS Score  ?BP (!) 174/109  ?Resp (!) 28  ?SpO2 98 %  ?O2 Device Tracheostomy Collar  ?O2 Flow Rate (L/min) 5 L/min  ?FiO2 (%) 28 %  ?Assess: MEWS Score  ?MEWS Temp 0  ?MEWS Systolic 0  ?MEWS Pulse 0  ?MEWS RR 2  ?MEWS LOC 3  ?MEWS Score 5  ?MEWS Score Color Red  ?Assess: if the MEWS score is Yellow or Red  ?Were vital signs taken at a resting state? Yes  ?Focused Assessment No change from prior assessment  ?Early Detection of Sepsis Score *See Row Information* Low  ?MEWS guidelines implemented *See Row Information* No, altered LOC is baseline  ?Document  ?Patient Outcome Not stable and remains on department  ?Progress note created (see row info) Yes  ? ? ?

## 2021-05-14 NOTE — Progress Notes (Signed)
OT Cancellation Note ? ?Patient Details ?Name: Billy Cherry ?MRN: [id number] ?DOB: 11/17/1970 ? ? ?Cancelled Treatment:    Reason Eval/Treat Not Completed: Other (comment) (per nsg, wife unable to attend scheduled family ed today due to illness). ? ?Billy Cherry C., OTR/L ?Acute Rehabilitation Services ?Pager [phone] ?Office [phone] ? ? ?Billy Cherry M ?05/14/2021, 1:28 PM ?

## 2021-05-14 NOTE — Progress Notes (Signed)
PROGRESS NOTE    Billy Cherry  LZJ:[id number] DOB: [birth year] DOA: 03/30/2021 PCP: Patient, No Pcp Per (Inactive)    Brief Narrative:  51 years old male with past medical history of hypertension was brought into the hospital after being found unresponsive by his wife.  EMS was called in and patient was noted to be in ventricular fibrillation with agonal respirations.  Patient was given CPR, epinephrine and had return of spontaneous circulation.  EKG showed anterolateral ST elevation and was taken to Cath Lab. Dr Josefa Half performed cath> insignificant CAD and EF 45-50%. Patient remained on ventilator after cath and was admitted to the ICU. Underwent TTM.  Neurology was consulted due to rhythmic twitching of his lips tongue and was started on Keppra.  MRI of the brain showed evidence of hypoxia with bilateral ACA MCA watershed infarcts.  2D echocardiogram showed LV ejection fraction of 20% with global hypokinesis.  Patient was also treated for ventilator associated pneumonia.  At this time, patient has undergone tracheostomy and PEG tube placement.  Patient did have a spike of fever on 2/28 thought to be secondary to aspiration and was on Fortaz since respiratory culture was growing Pseudomonas.  Blood culture however grew out MRSE in 3 out of 3 bottles.  Infectious disease was consulted ,repeat blood cultures were sent and patient is being observed off antibiotic.  Patient does  continue to have low-grade fever during hospitalization.  He continues to have elevated blood pressure and hydralazine dose has been adjusted.     Assessment and Plan: * Cardiac arrest ([name]) Postarrest anoxic encephalopathy Postarrest respiratory failure s/p TC S/P PEG tube placement Postarrest myoclonus: 2D echocardiogram showed reduced LV function at 20%.  Continue tracheostomy with trach collar.  Continue PEG tube feeding.  Remains obtunded with anoxic encephalopathy-on Keppra, for myoclonus and can increase Klonopin  for breakthrough myoclonus.  Continue supportive care.  At this time plan is home with home health-Home health/patient's wife and family will be taking turns to take care of him he will need around-the-clock care.Enteral tube feeding trach kit and supplies to be  arranged.  Anoxic encephalopathy ([place]) Continue supportive care.  Patient is unresponsive and has shown no improvement.  RLL pneumonia With low-grade fever.  Sepsis Previously had completed vancomycin for MSSA and [name].  Received more than 7-day course of IV Fortaz.  Infectious disease on board and patient is currently being observed without antibiotic.  Temperature max of 100.1 F.  Continue respiratory care.Patient is at a very high risk of recurrent pneumonia.   WBC at 10.3. Blood cultures from 05/09/2021 negative in 5 days.  Acute respiratory failure ([name]) Status post trach collar.  Continue supportive care  Staphylococcus epidermidis bacteremia MRSE.  Infectious disease was consulted.  Recommendation is to hold off with antibiotic for now and closely observe.  Repeat blood cultures have been sent on 05/09/2021, negative in 4 days  Dysphagia Continue PEG tube feeding.  Tolerating.  Obesity, Class I, BMI 30-34.9 BMI 33.  HTN (hypertension) Continue Coreg Isordil amlodipine Cardura hydralazine and PRN Labetalol.  Blood pressure seems to be elevated recently. Increased hydralazine to 100 mg 3 times daily from 05/13/21.  HFrEF (heart failure with reduced ejection fraction) ([place]) 2D echocardiogram-LVEF less than 20%, continue coreg, imdur, amlodipine, doxazosin, hydralazine, labetalol as needed  Hypernatremia Continue free water 400 cc Q 6 hrs for now sodium level has normalized at this time.  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  Continue current AED with Klonopin, Keppra. per neuro notes,  suppressing his myoclonus will not ultimately change the prognosis   Nutrition through the PEG tube.  Continue water flushes.    DVT prophylaxis: heparin injection 5,000 Units Start: 03/30/21 2000   Code Status:     Code Status: Full Code  Disposition: Home versus long-term care  Status is: Inpatient  Remains inpatient appropriate because: Pending clinical improvement, continues to have low-grade fever, being observed off antibiotic,  pending disposition plan.   Family Communication:  I spoke with the patient's wife on the phone 05/10/21 and updated her about the clinical condition of the patient.    Consultants:  Palliative,  ENT,  GI,  neuro,  cardiology,  PCCM,  Infectious disease  Procedures:  IR guided gastrostomy tube placement on 04/26/2021 Endotracheal tube placement mechanical ventilation. Tracheostomy   Antimicrobials:  Tressie Ellis 05/02/2021>3/8 Vancomycin 05/08/21>05/09/21  Anti-infectives (From admission, onward)    Start     Dose/Rate Route Frequency Ordered Stop   05/09/21 0800  vancomycin (VANCOREADY) IVPB 1250 mg/250 mL  Status:  Discontinued        1,250 mg 166.7 mL/hr over 90 Minutes Intravenous Every 12 hours 05/08/21 1621 05/09/21 0954   05/08/21 1715  vancomycin (VANCOREADY) IVPB 2000 mg/400 mL        2,000 mg 200 mL/hr over 120 Minutes Intravenous  Once 05/08/21 1618 05/08/21 1943   05/02/21 1000  cefTAZidime (FORTAZ) 2 g in sodium chloride 0.9 % 100 mL IVPB  Status:  Discontinued        2 g 200 mL/hr over 30 Minutes Intravenous Every 8 hours 05/02/21 0846 05/10/21 0936   04/26/21 0756  ceFAZolin (ANCEF) 2-4 GM/100ML-% IVPB  Status:  Discontinued       Note to Pharmacy: [name] L: cabinet override      04/26/21 0756 04/26/21 0855   04/26/21 0600  ceFAZolin (ANCEF) IVPB 2g/100 mL premix        2 g 200 mL/hr over 30 Minutes Intravenous Every 8 hours 04/25/21 1131 04/26/21 0915   04/23/21 1400  cefTAZidime (FORTAZ) 1 g in sodium chloride 0.9 % 100 mL IVPB        1 g 200 mL/hr over 30 Minutes Intravenous Every 8 hours 04/23/21 1256 04/25/21 2331   04/20/21 1300  ceFEPIme  (MAXIPIME) 2 g in sodium chloride 0.9 % 100 mL IVPB  Status:  Discontinued        2 g 200 mL/hr over 30 Minutes Intravenous Every 8 hours 04/20/21 1205 04/23/21 1241   04/14/21 0930  vancomycin (VANCOREADY) IVPB 1250 mg/250 mL        1,250 mg 166.7 mL/hr over 90 Minutes Intravenous Every 24 hours 04/13/21 0902 04/19/21 1247   04/13/21 0930  vancomycin (VANCOREADY) IVPB 2000 mg/400 mL        2,000 mg 200 mL/hr over 120 Minutes Intravenous  Once 04/13/21 0831 04/13/21 1158   04/12/21 0815  ceFEPIme (MAXIPIME) 2 g in sodium chloride 0.9 % 100 mL IVPB  Status:  Discontinued        2 g 200 mL/hr over 30 Minutes Intravenous Every 8 hours 04/12/21 0724 04/13/21 0829   04/06/21 1800  ceFAZolin (ANCEF) IVPB 2g/100 mL premix        2 g 200 mL/hr over 30 Minutes Intravenous Every 8 hours 04/06/21 1403 04/10/21 1913   04/04/21 1000  ceFEPIme (MAXIPIME) 2 g in sodium chloride 0.9 % 100 mL IVPB  Status:  Discontinued        2 g 200 mL/hr over  30 Minutes Intravenous Every 8 hours 04/04/21 0919 04/06/21 1403      Subjective: Today, patient was seen and examined at bedside.  Continues to have low-grade fever.  Nonverbal.  No other interval complaints reported.   Objective: Vitals:   05/14/21 0002 05/14/21 0321 05/14/21 0848 05/14/21 1010  BP:  (!) 174/109  (!) 151/102  Pulse: 98  82 87  Resp: (!) 22 (!) 28 (!) 25   Temp:    99.5 F (37.5 C)  TempSrc:      SpO2: 99% 98% 100% 99%  Weight:      Height:        Intake/Output Summary (Last 24 hours) at 05/14/2021 1101 Last data filed at 05/14/2021 0600 Gross per 24 hour  Intake --  Output 1700 ml  Net -1700 ml   Filed Weights   05/10/21 0448 05/11/21 0400 05/12/21 0423  Weight: 101 kg 99 kg 99 kg   Body mass index is 31.32 kg/m.   Physical Examination:  General: Obese built, not in obvious distress chronically ill, nonverbal, deconditioned, opens eyes spontaneously HENT:   No scleral pallor or icterus noted. Oral mucosa is moist.   Trach collar in place Chest:   Diminished breath sounds bilaterally.  CVS: S1 &S2 heard. No murmur.  Regular rate and rhythm. Abdomen: Soft, nontender, nondistended.  Bowel sounds are heard.  PEG tube in place. Extremities: No cyanosis, clubbing or edema.  Peripheral pulses are palpable. Psych: Unresponsive CNS: Nonverbal, nonresponsive, flaccid extremities Skin: Warm and dry.  No rashes noted.   CBC: Recent Labs  Lab 05/07/21 1101 05/08/21 0426 05/09/21 0329 05/10/21 0406 05/11/21 0455 05/14/21 0336  WBC 11.5* 12.5* 11.9* 11.9* 11.4* 10.3  NEUTROABS 8.0* 9.4*  --   --   --   --   HGB 11.0* 12.5* 10.9* 11.2* 12.3* 11.4*  HCT 34.7* 37.4* 33.1* 34.7* 39.1 34.1*  MCV 95.9 94.2 94.8 95.6 100.0 95.0  PLT 178 174 181 198 184 929    Basic Metabolic Panel: Recent Labs  Lab 05/08/21 0426 05/09/21 0329 05/10/21 0406 05/11/21 0455 05/14/21 0336  NA 140 140 141 144 135  K 4.1 3.9 3.9 3.7 4.4  CL 101 102 103 106 98  CO2 _0 GLUCOSE 102* 95 96 107* 98  BUN 26* 26* 27* 26* 21*  CREATININE 0.71 0.73 0.71 0.66 0.67  CALCIUM 8.4* 8.2* 8.1* 8.4* 8.2*  MG  --  2.1 2.2 2.0 2.0    Liver Function Tests: Recent Labs  Lab 05/07/21 1101 05/08/21 0426  AST 28 29  ALT 23 22  ALKPHOS 38 45  BILITOT 0.4 0.5  PROT 6.3* 6.6  ALBUMIN 1.8* 1.8*     Radiology Studies: No results found.    LOS: 45 days    Flora Lipps, MD [facility] 05/14/2021, 11:01 AM

## 2021-05-14 NOTE — Progress Notes (Addendum)
PT Cancellation Note ? ?Patient Details ?Name: Billy Cherry ?MRN: [id number] ?DOB: 09-12-1970 ? ? ?Cancelled Treatment:    Reason Eval/Treat Not Completed: Other (comment) per nsg, wife unable to attend scheduled family ed today due to illness. PT has tried to meet with wife several times now without success. Will sign off at this time; please re-consult if need change. ? ?Wyona Almas, PT, DPT ?Acute Rehabilitation Services ?Pager [phone] ?Office [phone] ? ? ? ?Carloine Margo Aye ?05/14/2021, 1:55 PM ?

## 2021-05-15 ENCOUNTER — Inpatient Hospital Stay (HOSPITAL_COMMUNITY): Payer: BC Managed Care – PPO

## 2021-05-15 DIAGNOSIS — I469 Cardiac arrest, cause unspecified: Secondary | ICD-10-CM | POA: Diagnosis not present

## 2021-05-15 DIAGNOSIS — M7989 Other specified soft tissue disorders: Secondary | ICD-10-CM

## 2021-05-15 MED ORDER — APIXABAN 5 MG PO TABS
10.0000 mg | ORAL_TABLET | Freq: Two times a day (BID) | ORAL | Status: AC
  Administered 2021-05-15 – 2021-05-21 (×14): 10 mg via ORAL
  Filled 2021-05-15 (×14): qty 2

## 2021-05-15 MED ORDER — APIXABAN 5 MG PO TABS
5.0000 mg | ORAL_TABLET | Freq: Two times a day (BID) | ORAL | Status: DC
  Administered 2021-05-22 – 2021-06-05 (×30): 5 mg via ORAL
  Filled 2021-05-15 (×30): qty 1

## 2021-05-15 NOTE — Discharge Instructions (Addendum)
Information on my medicine - ELIQUIS (apixaban)  This medication education was reviewed with me or my healthcare representative as part of my discharge preparation.     Why was Eliquis prescribed for you? Eliquis was prescribed to treat blood clots that may have been found in the veins of your legs (deep vein thrombosis) or in your lungs (pulmonary embolism) and to reduce the risk of them occurring again.  What do You need to know about Eliquis ? The starting dose is 10 mg (two 5 mg tablets) taken TWICE daily for the FIRST SEVEN (7) DAYS, then on (enter date)  05/22/21  the dose is reduced to ONE 5 mg tablet taken TWICE daily.  Eliquis may be taken with or without food.   Try to take the dose about the same time in the morning and in the evening. If you have difficulty swallowing the tablet whole please discuss with your pharmacist how to take the medication safely.  Take Eliquis exactly as prescribed and DO NOT stop taking Eliquis without talking to the doctor who prescribed the medication.  Stopping may increase your risk of developing a new blood clot.  Refill your prescription before you run out.  After discharge, you should have regular check-up appointments with your healthcare provider that is prescribing your Eliquis.    What do you do if you miss a dose? If a dose of ELIQUIS is not taken at the scheduled time, take it as soon as possible on the same day and twice-daily administration should be resumed. The dose should not be doubled to make up for a missed dose.  Important Safety Information A possible side effect of Eliquis is bleeding. You should call your healthcare provider right away if you experience any of the following: Bleeding from an injury or your nose that does not stop. Unusual colored urine (red or dark brown) or unusual colored stools (red or black). Unusual bruising for unknown reasons. A serious fall or if you hit your head (even if there is no  bleeding).  Some medicines may interact with Eliquis and might increase your risk of bleeding or clotting while on Eliquis. To help avoid this, consult your healthcare provider or pharmacist prior to using any new prescription or non-prescription medications, including herbals, vitamins, non-steroidal anti-inflammatory drugs (NSAIDs) and supplements.  This website has more information on Eliquis (apixaban): http://www.eliquis.com/eliquis/home

## 2021-05-15 NOTE — Progress Notes (Signed)
PROGRESS NOTE    Billy Cherry  YBO:[id number] DOB: [birth year] DOA: 03/30/2021 PCP: Patient, No Pcp Per (Inactive)    Brief Narrative:  51 years old male with past medical history of hypertension was brought into the hospital after being found unresponsive by his wife.  EMS was called in and patient was noted to be in ventricular fibrillation with agonal respirations.  Patient was given CPR, epinephrine and had return of spontaneous circulation.  EKG showed anterolateral ST elevation and was taken to Cath Lab. Dr Josefa Half performed cath> insignificant CAD and EF 45-50%. Patient remained on ventilator after cath and was admitted to the ICU. Underwent TTM.  Neurology was consulted due to rhythmic twitching of his lips tongue and was started on Keppra.  MRI of the brain showed evidence of hypoxia with bilateral ACA MCA watershed infarcts.  2D echocardiogram showed LV ejection fraction of 20% with global hypokinesis.  Patient was also treated for ventilator associated pneumonia.  At this time, patient has undergone tracheostomy and PEG tube placement.  Patient did have a spike of fever on 2/28 thought to be secondary to aspiration and was on Fortaz since respiratory culture was growing Pseudomonas.  Blood culture however grew out MRSE in 3 out of 3 bottles.  Infectious disease was consulted ,repeat blood cultures were sent and patient is being observed off antibiotic.  Patient does  continue to have low-grade fever during hospitalization.  He continues to have elevated blood pressure and hydralazine dose has been adjusted.     Assessment and Plan: * Cardiac arrest ([place]) Postarrest anoxic encephalopathy Postarrest respiratory failure s/p TC S/P PEG tube placement Postarrest myoclonus: 2D echocardiogram showed reduced LV function at 20%.  Continue tracheostomy with trach collar.  Continue PEG tube feeding.  Remains obtunded with anoxic encephalopathy-on Keppra, for myoclonus and can increase Klonopin  for breakthrough myoclonus.  Continue supportive care.  At this time, plan is home with home health-Home health/patient's wife and family will be taking turns to take care of him he will need around-the-clock care.Enteral tube feeding trach kit and supplies to be  arranged.  Anoxic encephalopathy ([place]) Continue supportive care.  Patient is unresponsive and has shown no improvement.  RLL pneumonia With low-grade fever.  Sepsis Previously had completed vancomycin for MSSA and [name].  Received more than 7-day course of IV Fortaz.  Infectious disease on board and patient is currently being observed without antibiotic.  Temperature max of 100.1 F.  Continue respiratory care.Patient is at a very high risk of recurrent pneumonia.   WBC at 10.3. Blood cultures from 05/09/2021 negative in 5 days. Communicated with Dr. Linus Salmons about the persistent fever.  Acute respiratory failure ([name]) Status post trach collar.  Continue supportive care  Staphylococcus epidermidis bacteremia MRSE.  Infectious disease was consulted.  Recommendation is to hold off with antibiotic for now and closely observe.  Repeat blood cultures have been sent on 05/09/2021, negative in 4 days  Dysphagia Continue PEG tube feeding.  Tolerating.  Obesity, Class I, BMI 30-34.9 BMI 33.  HTN (hypertension) Continue Coreg Isordil amlodipine Cardura hydralazine and PRN Labetalol.  Blood pressure seems to be elevated recently. Increased hydralazine to 100 mg 3 times daily from 05/13/21.  HFrEF (heart failure with reduced ejection fraction) ([place]) 2D echocardiogram-LVEF less than 20%, continue coreg, imdur, amlodipine, doxazosin, hydralazine, labetalol as needed  Hypernatremia Continue free water 400 cc Q 6 hrs for now sodium level has normalized at this time.  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  Continue  current AED with Klonopin, Keppra. per neuro notes, suppressing his myoclonus will not ultimately change the prognosis    Nutrition through the PEG tube.  Continue water flushes.   DVT prophylaxis: heparin injection 5,000 Units Start: 03/30/21 2000   Code Status:     Code Status: Full Code  Disposition: Home versus long-term care  Status is: Inpatient  Remains inpatient appropriate because: Pending clinical improvement, continues to have low-grade fever, being observed off antibiotic,  pending disposition plan.   Family Communication:  I spoke with the patient's wife on the phone 05/10/21   Consultants:  Palliative,  ENT,  GI,  neuro,  cardiology,  PCCM,  Infectious disease  Procedures:  IR guided gastrostomy tube placement on 04/26/2021 Endotracheal tube placement mechanical ventilation. Tracheostomy   Antimicrobials:  Tressie Ellis 05/02/2021>3/8 Vancomycin 05/08/21>05/09/21  Anti-infectives (From admission, onward)    Start     Dose/Rate Route Frequency Ordered Stop   05/09/21 0800  vancomycin (VANCOREADY) IVPB 1250 mg/250 mL  Status:  Discontinued        1,250 mg 166.7 mL/hr over 90 Minutes Intravenous Every 12 hours 05/08/21 1621 05/09/21 0954   05/08/21 1715  vancomycin (VANCOREADY) IVPB 2000 mg/400 mL        2,000 mg 200 mL/hr over 120 Minutes Intravenous  Once 05/08/21 1618 05/08/21 1943   05/02/21 1000  cefTAZidime (FORTAZ) 2 g in sodium chloride 0.9 % 100 mL IVPB  Status:  Discontinued        2 g 200 mL/hr over 30 Minutes Intravenous Every 8 hours 05/02/21 0846 05/10/21 0936   04/26/21 0756  ceFAZolin (ANCEF) 2-4 GM/100ML-% IVPB  Status:  Discontinued       Note to Pharmacy: [name] L: cabinet override      04/26/21 0756 04/26/21 0855   04/26/21 0600  ceFAZolin (ANCEF) IVPB 2g/100 mL premix        2 g 200 mL/hr over 30 Minutes Intravenous Every 8 hours 04/25/21 1131 04/26/21 0915   04/23/21 1400  cefTAZidime (FORTAZ) 1 g in sodium chloride 0.9 % 100 mL IVPB        1 g 200 mL/hr over 30 Minutes Intravenous Every 8 hours 04/23/21 1256 04/25/21 2331   04/20/21 1300  ceFEPIme  (MAXIPIME) 2 g in sodium chloride 0.9 % 100 mL IVPB  Status:  Discontinued        2 g 200 mL/hr over 30 Minutes Intravenous Every 8 hours 04/20/21 1205 04/23/21 1241   04/14/21 0930  vancomycin (VANCOREADY) IVPB 1250 mg/250 mL        1,250 mg 166.7 mL/hr over 90 Minutes Intravenous Every 24 hours 04/13/21 0902 04/19/21 1247   04/13/21 0930  vancomycin (VANCOREADY) IVPB 2000 mg/400 mL        2,000 mg 200 mL/hr over 120 Minutes Intravenous  Once 04/13/21 0831 04/13/21 1158   04/12/21 0815  ceFEPIme (MAXIPIME) 2 g in sodium chloride 0.9 % 100 mL IVPB  Status:  Discontinued        2 g 200 mL/hr over 30 Minutes Intravenous Every 8 hours 04/12/21 0724 04/13/21 0829   04/06/21 1800  ceFAZolin (ANCEF) IVPB 2g/100 mL premix        2 g 200 mL/hr over 30 Minutes Intravenous Every 8 hours 04/06/21 1403 04/10/21 1913   04/04/21 1000  ceFEPIme (MAXIPIME) 2 g in sodium chloride 0.9 % 100 mL IVPB  Status:  Discontinued        2 g 200 mL/hr over 30 Minutes Intravenous  Every 8 hours 04/04/21 0919 04/06/21 1403      Subjective: Today, patient was seen and examined at bedside.  Continues low-grade fever.  Nonverbal.  No interval complaints reported.   Objective: Vitals:   05/15/21 0806 05/15/21 0900 05/15/21 1000 05/15/21 1108  BP:  (!) 126/95 (!) 151/108   Pulse: 98 95 94   Resp: 20 (!) 28 (!) 25 20  Temp:   99 F (37.2 C)   TempSrc:   Oral   SpO2:   100%   Weight:      Height:        Intake/Output Summary (Last 24 hours) at 05/15/2021 1204 Last data filed at 05/15/2021 0451 Gross per 24 hour  Intake 0 ml  Output 1650 ml  Net -1650 ml   Filed Weights   05/10/21 0448 05/11/21 0400 05/12/21 0423  Weight: 101 kg 99 kg 99 kg   Body mass index is 31.32 kg/m.   Physical Examination:  General: Obese built, not in obvious distress chronically ill, nonverbal, deconditioned, open eyes spontaneously HENT:   No scleral pallor or icterus noted. Oral mucosa is moist.  In place. Chest: .   Diminished breath sounds bilaterally. CVS: S1 &S2 heard. No murmur.  Regular rate and rhythm. Abdomen: Soft, nontender, nondistended.  Bowel sounds are heard.  PEG tube in place. Extremities: No cyanosis, clubbing or edema.  Peripheral pulses are palpable. Psych: Unresponsive CNS: Nonverbal, nonresponsive, flaccid extremities Skin: Warm and dry.  No rashes noted.  CBC: Recent Labs  Lab 05/09/21 0329 05/10/21 0406 05/11/21 0455 05/14/21 0336  WBC 11.9* 11.9* 11.4* 10.3  HGB 10.9* 11.2* 12.3* 11.4*  HCT 33.1* 34.7* 39.1 34.1*  MCV 94.8 95.6 100.0 95.0  PLT 181 198 184 037    Basic Metabolic Panel: Recent Labs  Lab 05/09/21 0329 05/10/21 0406 05/11/21 0455 05/14/21 0336  NA 140 141 144 135  K 3.9 3.9 3.7 4.4  CL 102 103 106 98  CO2 _0 GLUCOSE 95 96 107* 98  BUN 26* 27* 26* 21*  CREATININE 0.73 0.71 0.66 0.67  CALCIUM 8.2* 8.1* 8.4* 8.2*  MG 2.1 2.2 2.0 2.0    Liver Function Tests: No results for input(s): AST, ALT, ALKPHOS, BILITOT, PROT, ALBUMIN in the last 168 hours.    Radiology Studies: VAS [place] LOWER EXTREMITY VENOUS (DVT)  Result Date: 05/15/2021  Lower Venous DVT Study Patient Name:  Billy Cherry  Date of Exam:   05/15/2021 Medical Rec #: [id number]        Accession #:    [id number] Date of Birth: 02-06-1971        Patient Gender: M Patient Age:   65 years Exam Location:  [facility] Procedure:      VAS [place] LOWER EXTREMITY VENOUS (DVT) Referring Phys: Leslye Peer --------------------------------------------------------------------------------  Indications: Swelling.  Limitations: Patient on trach, non responsive, immobile. Comparison Study: No prior study on file Performing Technologist: Sharion Dove RVS  Examination Guidelines: A complete evaluation includes B-mode imaging, spectral Doppler, color Doppler, and power Doppler as needed of all accessible portions of each vessel. Bilateral testing is considered an integral part of a complete  examination. Limited examinations for reoccurring indications may be performed as noted. The reflux portion of the exam is performed with the patient in reverse Trendelenburg.  +---------+---------------+---------+-----------+----------+--------------+  RIGHT     Compressibility Phasicity Spontaneity Properties Thrombus Aging  +---------+---------------+---------+-----------+----------+--------------+  CFV       Full  Yes       Yes                                    +---------+---------------+---------+-----------+----------+--------------+  SFJ       Full                                                             +---------+---------------+---------+-----------+----------+--------------+  FV Prox   Full                                                             +---------+---------------+---------+-----------+----------+--------------+  FV Mid    Full                                                             +---------+---------------+---------+-----------+----------+--------------+  FV Distal Full                                                             +---------+---------------+---------+-----------+----------+--------------+  PFV       Full                                                             +---------+---------------+---------+-----------+----------+--------------+  POP       Full            Yes       Yes                                    +---------+---------------+---------+-----------+----------+--------------+  PTV       Full                                                             +---------+---------------+---------+-----------+----------+--------------+  PERO      Full                                                             +---------+---------------+---------+-----------+----------+--------------+   +---------+---------------+---------+-----------+---------------+--------------+  LEFT      Compressibility Phasicity Spontaneity  Properties      Thrombus Aging   +---------+---------------+---------+-----------+---------------+--------------+  CFV       Full                                  pulsatile                                                                        waveforms                       +---------+---------------+---------+-----------+---------------+--------------+  SFJ       Full                                                                  +---------+---------------+---------+-----------+---------------+--------------+  FV Prox   None            No        No                          Age                                                                              Indeterminate   +---------+---------------+---------+-----------+---------------+--------------+  FV Mid    None            No        No                          Age                                                                              Indeterminate   +---------+---------------+---------+-----------+---------------+--------------+  FV Distal None            No        No                          Acute           +---------+---------------+---------+-----------+---------------+--------------+  PFV       Full                                  pulsatile  waveforms                       +---------+---------------+---------+-----------+---------------+--------------+  POP       None                                                  Age                                                                              Indeterminate   +---------+---------------+---------+-----------+---------------+--------------+  PTV       None                                                  Age                                                                              Indeterminate   +---------+---------------+---------+-----------+---------------+--------------+  PERO      None                                                  Age                                                                               Indeterminate   +---------+---------------+---------+-----------+---------------+--------------+    Summary: RIGHT: - There is no evidence of deep vein thrombosis in the lower extremity.  LEFT: - Findings consistent with age indeterminate deep vein thrombosis involving the left femoral vein, left proximal profunda vein, left posterior tibial veins, left popliteal vein, and left peroneal veins.  *See table(s) above for measurements and observations.    Preliminary       LOS: 60 days    Flora Lipps, MD [facility] 05/15/2021, 12:04 PM

## 2021-05-15 NOTE — Progress Notes (Signed)
ANTICOAGULATION CONSULT NOTE - Initial Consult ? ?Pharmacy Consult for Apixaban ?Indication: DVT ? ?Allergies  ?Allergen Reactions  ? Lisinopril Swelling  ?  angioedema  ? ? ?Patient Measurements: ?Height:  (bed scale not working) ?Weight: 99 kg (218 lb 4.1 oz) ?IBW/kg (Calculated) : 73 ? ? ?Vital Signs: ?Temp: 99 ?F (37.2 ?C) (03/13 1000) ?Temp Source: Oral (03/13 1000) ?BP: 151/108 (03/13 1000) ?Pulse Rate: 94 (03/13 1000) ? ?Labs: ?Recent Labs  ?  05/14/21 ?0336  ?HGB 11.4*  ?HCT 34.1*  ?PLT 231  ?CREATININE 0.67  ? ? ?Estimated Creatinine Clearance: 130.3 mL/min (by C-G formula based on SCr of 0.67 mg/dL). ? ? ?Medical History: ?Past Medical History:  ?Diagnosis Date  ? Hypertension   ? ? ?Medications:  ?Scheduled:  ? amLODipine  10 mg Per Tube Daily  ? atropine  2 drop Sublingual QID  ? carvedilol  25 mg Per Tube BID  ? chlorhexidine gluconate (MEDLINE KIT)  15 mL Mouth Rinse BID  ? clonazepam  0.5 mg Per Tube TID  ? doxazosin  2 mg Per Tube Daily  ? feeding supplement (JEVITY 1.5 CAL/FIBER)  355 mL Per Tube TID WC  ? feeding supplement (PROSource TF)  45 mL Per Tube BID  ? fiber  1 packet Per Tube BID  ? free water  400 mL Per Tube Q6H  ? Gerhardt's butt cream   Topical Daily  ? heparin  5,000 Units Subcutaneous Q8H  ? hydrALAZINE  100 mg Per Tube Q8H  ? isosorbide dinitrate  20 mg Per Tube TID  ? levETIRAcetam  750 mg Oral BID  ? mouth rinse  15 mL Mouth Rinse q12n4p  ? pantoprazole sodium  40 mg Per Tube QHS  ? scopolamine  1 patch Transdermal Q72H  ? valproic acid  750 mg Per Tube TID  ? ? ?Assessment: ?51 yo male with new diagnosis of DVT in the left leg. MD wishes to start apixaban. No interacting meds identified.  ? ?Goal of Therapy:  ?Anticoagulation ?Monitor platelets by anticoagulation protocol: Yes ?  ?Plan: ?D/c sq heparin  ?Apixaban $RemoveBe'10mg'[id number]$  PO BID x 7 days, then $RemoveBe'5mg'[id number]$  BID ?Monitor CBC and for bleeding.  ? ?Gazelle Towe A. Levada Dy, PharmD, BCPS, FNKF ?Clinical Pharmacist ?[place] ?Please utilize Amion for  appropriate phone number to reach the unit pharmacist ([place]) ? ? ? ?

## 2021-05-15 NOTE — Significant Event (Signed)
Pt significant other at bedside suctioning patient. ?

## 2021-05-15 NOTE — Progress Notes (Signed)
Pt significant other whom is requesting guardianship  called to check on pt. Gave an update on status including Vitals with no significant changing. With plans to change trach ties with R/T. ?

## 2021-05-15 NOTE — Progress Notes (Signed)
VASCULAR LAB ? ? ? ?Bilateral lower extremity venous duplex has been performed. ? ?See CV proc for preliminary results. ? ? ?Wanisha Shiroma, RVT ?05/15/2021, 11:31 AM ? ?

## 2021-05-15 NOTE — Progress Notes (Signed)
[facility] Infectious Disease  Date of Admission:  03/30/2021     Total days of antibiotics 9         ASSESSMENT:  Mr. Gillentine is currently being observed off antibiotics 8 days of Ceftazidime for suspected Pseudomonal pneumonia. Continues to have persistent elevated temperatures between 99-100 F. Left lower extremity has significantly more edema compared to the right and lower extremity doppler today with age indeterminate DVT on the left. Suspect this may be the cause of his elevated temperatures given no other significant findings on physical exam or lab work.  May also consider changing PIV. Recommend continuing to monitor off antibiotics with management of DVT per primary team.   PLAN:  Continue to monitor off antibiotics.  DVT management per primary team.   Principal Problem:   Cardiac arrest [facility]) Active Problems:   Acute respiratory failure ([name])   Myoclonus   Anoxic encephalopathy ([name])   Hypernatremia   HFrEF (heart failure with reduced ejection fraction) ([name])   RLL pneumonia   Sepsis ([name])   HTN (hypertension)   Dysphagia   Obesity, Class I, BMI 30-34.9   Staphylococcus epidermidis bacteremia    amLODipine  10 mg Per Tube Daily   atropine  2 drop Sublingual QID   carvedilol  25 mg Per Tube BID   chlorhexidine gluconate (MEDLINE KIT)  15 mL Mouth Rinse BID   clonazepam  0.5 mg Per Tube TID   doxazosin  2 mg Per Tube Daily   feeding supplement (JEVITY 1.5 CAL/FIBER)  355 mL Per Tube TID WC   feeding supplement (PROSource TF)  45 mL Per Tube BID   fiber  1 packet Per Tube BID   free water  400 mL Per Tube Q6H   Gerhardt's butt cream   Topical Daily   heparin  5,000 Units Subcutaneous Q8H   hydrALAZINE  100 mg Per Tube Q8H   isosorbide dinitrate  20 mg Per Tube TID   levETIRAcetam  750 mg Oral BID   mouth rinse  15 mL Mouth Rinse q12n4p   pantoprazole sodium  40 mg Per Tube QHS   scopolamine  1 patch Transdermal Q72H   valproic acid  750 mg Per  Tube TID    SUBJECTIVE:  Mr. Bufkin was last seen by ID on 05/10/21 with possibility of pneumonia and trach aspirate on 05/02/21 with Pseudomonas and treated with 8 days of Ceftazidime. Antibiotics were stopped at that time. Also noted to have a positive blood culture with Coagulase negative Staph most consistent with a contaminate supported by repeat blood cultures on 05/09/21 without growth to date. Mr. Georgiou is currently being observed off antibiotics with concern for elevated temperatures with max temperature of 100.2 F. Temperature continue to stay between 99-100 F. ID has been asked to evaluate persistent elevated temperatures. Lower extremity dopplers performed with no evidence of DVT in the right lower extremity and age indeterminate DVT involving the left femoral vein, left proximal profunda vein, left posterior tibial vein, left popliteal vein and left peroneal vein.    Allergies  Allergen Reactions   Lisinopril Swelling    angioedema     Review of Systems: Review of Systems  Unable to perform ROS: Patient unresponsive     OBJECTIVE: Vitals:   05/15/21 0806 05/15/21 0900 05/15/21 1000 05/15/21 1108  BP:  (!) 126/95 (!) 151/108   Pulse: 98 95 94   Resp: 20 (!) 28 (!) 25 20  Temp:   99 F (  37.2 C)   TempSrc:   Oral   SpO2:   100%   Weight:      Height:       Body mass index is 31.32 kg/m.  Physical Exam Constitutional:      General: He is not in acute distress.    Appearance: Normal appearance. He is well-developed.  Cardiovascular:     Rate and Rhythm: Regular rhythm. Tachycardia present.     Heart sounds: Normal heart sounds.  Pulmonary:     Effort: Pulmonary effort is normal.     Breath sounds: Normal breath sounds.     Comments: On trach collar; secretions noted are clear.  Abdominal:     General: Abdomen is flat. Bowel sounds are normal.     Comments: Tube feeds infusing.   Skin:    General: Skin is warm and dry.    Lab Results Lab Results   Component Value Date   WBC 10.3 05/14/2021   HGB 11.4 (L) 05/14/2021   HCT 34.1 (L) 05/14/2021   MCV 95.0 05/14/2021   PLT 231 05/14/2021    Lab Results  Component Value Date   CREATININE 0.67 05/14/2021   BUN 21 (H) 05/14/2021   NA 135 05/14/2021   K 4.4 05/14/2021   CL 98 05/14/2021   CO2 31 05/14/2021    Lab Results  Component Value Date   ALT 22 05/08/2021   AST 29 05/08/2021   ALKPHOS 45 05/08/2021   BILITOT 0.5 05/08/2021     Microbiology: Recent Results (from the past 240 hour(s))  Culture, blood (routine x 2)     Status: Abnormal   Collection Time: 05/07/21 12:30 PM   Specimen: BLOOD RIGHT HAND  Result Value Ref Range Status   Specimen Description BLOOD RIGHT HAND  Final   Special Requests   Final    BOTTLES DRAWN AEROBIC AND ANAEROBIC Blood Culture results may not be optimal due to an inadequate volume of blood received in culture bottles   Culture  Setup Time   Final    GRAM POSITIVE COCCI IN CLUSTERS IN BOTH AEROBIC AND ANAEROBIC BOTTLES CRITICAL RESULT CALLED TO, READ BACK BY AND VERIFIED WITH: PHARM D E.UELAND ON [id number] AT 33 BY E.PARRISH    Culture (A)  Final    STAPHYLOCOCCUS EPIDERMIDIS THE SIGNIFICANCE OF ISOLATING THIS ORGANISM FROM A SINGLE SET OF BLOOD CULTURES WHEN MULTIPLE SETS ARE DRAWN IS UNCERTAIN. PLEASE NOTIFY THE MICROBIOLOGY DEPARTMENT WITHIN ONE WEEK IF SPECIATION AND SENSITIVITIES ARE REQUIRED. STAPHYLOCOCCUS CAPITIS SUSCEPTIBILITIES PERFORMED ON PREVIOUS CULTURE WITHIN THE LAST 5 DAYS. Performed at [facility], [place] [address] [place],  [address]    Report Status 05/10/2021 FINAL  Final  Blood Culture ID Panel (Reflexed)     Status: Abnormal   Collection Time: 05/07/21 12:30 PM  Result Value Ref Range Status   Enterococcus faecalis NOT DETECTED NOT DETECTED Final   Enterococcus Faecium NOT DETECTED NOT DETECTED Final   Listeria monocytogenes NOT DETECTED NOT DETECTED Final   Staphylococcus species DETECTED (A) NOT  DETECTED Final    Comment: CRITICAL RESULT CALLED TO, READ BACK BY AND VERIFIED WITH: Vena Austria E.UELAND ON [id number] AT 1424 BY E.PARRISH    Staphylococcus aureus (BCID) NOT DETECTED NOT DETECTED Final   Staphylococcus epidermidis DETECTED (A) NOT DETECTED Final    Comment: Methicillin (oxacillin) resistant coagulase negative staphylococcus. Possible blood culture contaminant (unless isolated from more than one blood culture draw or clinical case suggests pathogenicity). No antibiotic treatment is  indicated for blood  culture contaminants. CRITICAL RESULT CALLED TO, READ BACK BY AND VERIFIED WITH: Vena Austria E.UELAND ON [id number] AT 1601 BY E.PARRISH    Staphylococcus lugdunensis NOT DETECTED NOT DETECTED Final   Streptococcus species NOT DETECTED NOT DETECTED Final   Streptococcus agalactiae NOT DETECTED NOT DETECTED Final   Streptococcus pneumoniae NOT DETECTED NOT DETECTED Final   Streptococcus pyogenes NOT DETECTED NOT DETECTED Final   A.calcoaceticus-baumannii NOT DETECTED NOT DETECTED Final   Bacteroides fragilis NOT DETECTED NOT DETECTED Final   Enterobacterales NOT DETECTED NOT DETECTED Final   Enterobacter cloacae complex NOT DETECTED NOT DETECTED Final   Escherichia coli NOT DETECTED NOT DETECTED Final   Klebsiella aerogenes NOT DETECTED NOT DETECTED Final   Klebsiella oxytoca NOT DETECTED NOT DETECTED Final   Klebsiella pneumoniae NOT DETECTED NOT DETECTED Final   Proteus species NOT DETECTED NOT DETECTED Final   Salmonella species NOT DETECTED NOT DETECTED Final   Serratia marcescens NOT DETECTED NOT DETECTED Final   Haemophilus influenzae NOT DETECTED NOT DETECTED Final   Neisseria meningitidis NOT DETECTED NOT DETECTED Final   Pseudomonas aeruginosa NOT DETECTED NOT DETECTED Final   Stenotrophomonas maltophilia NOT DETECTED NOT DETECTED Final   Candida albicans NOT DETECTED NOT DETECTED Final   Candida auris NOT DETECTED NOT DETECTED Final   Candida glabrata NOT DETECTED NOT  DETECTED Final   Candida krusei NOT DETECTED NOT DETECTED Final   Candida parapsilosis NOT DETECTED NOT DETECTED Final   Candida tropicalis NOT DETECTED NOT DETECTED Final   Cryptococcus neoformans/gattii NOT DETECTED NOT DETECTED Final   Methicillin resistance mecA/C DETECTED (A) NOT DETECTED Final    Comment: CRITICAL RESULT CALLED TO, READ BACK BY AND VERIFIED WITH: Nevin Bloodgood ON [id number] AT 1424 BY E.PARRISH Performed at [facility], [address] [place], [place] [address]   Culture, blood (routine x 2)     Status: Abnormal   Collection Time: 05/07/21 12:37 PM   Specimen: BLOOD RIGHT HAND  Result Value Ref Range Status   Specimen Description BLOOD RIGHT HAND  Final   Special Requests   Final    BOTTLES DRAWN AEROBIC ONLY Blood Culture adequate volume   Culture  Setup Time   Final    GRAM POSITIVE COCCI IN CLUSTERS AEROBIC BOTTLE ONLY CRITICAL VALUE NOTED.  VALUE IS CONSISTENT WITH PREVIOUSLY REPORTED AND CALLED VALUE. Performed at [facility], [place] [address], [place],  [address]    Culture STAPHYLOCOCCUS CAPITIS (A)  Final   Report Status 05/10/2021 FINAL  Final   Organism ID, Bacteria STAPHYLOCOCCUS CAPITIS  Final      Susceptibility   Staphylococcus capitis - MIC*    CIPROFLOXACIN >=8 RESISTANT Resistant     ERYTHROMYCIN >=8 RESISTANT Resistant     GENTAMICIN 8 INTERMEDIATE Intermediate     OXACILLIN >=4 RESISTANT Resistant     TETRACYCLINE 2 SENSITIVE Sensitive     VANCOMYCIN 1 SENSITIVE Sensitive     TRIMETH/SULFA <=10 SENSITIVE Sensitive     CLINDAMYCIN >=8 RESISTANT Resistant     RIFAMPIN <=0.5 SENSITIVE Sensitive     Inducible Clindamycin NEGATIVE Sensitive     * STAPHYLOCOCCUS CAPITIS  Culture, blood (routine x 2)     Status: None   Collection Time: 05/09/21 10:45 AM   Specimen: BLOOD RIGHT WRIST  Result Value Ref Range Status   Specimen Description BLOOD RIGHT WRIST  Final   Special Requests   Final    BOTTLES DRAWN AEROBIC ONLY  Blood  Culture results may not be optimal due to an inadequate volume of blood received in culture bottles   Culture   Final    NO GROWTH 5 DAYS Performed at [facility], [place] [address] [place], [place] [address]    Report Status 05/14/2021 FINAL  Final  Culture, blood (routine x 2)     Status: None   Collection Time: 05/09/21 10:46 AM   Specimen: BLOOD RIGHT HAND  Result Value Ref Range Status   Specimen Description BLOOD RIGHT HAND  Final   Special Requests   Final    BOTTLES DRAWN AEROBIC AND ANAEROBIC Blood Culture adequate volume   Culture   Final    NO GROWTH 5 DAYS Performed at [facility], [place] [address], [place], [place] [address]    Report Status 05/14/2021 FINAL  Final     Terri Piedra, NP [facility]ious [facility]  05/15/2021  12:09 PM

## 2021-05-15 NOTE — Progress Notes (Addendum)
?NAME:  Billy Cherry, MRN:  [id number], DOB:  11-18-1970, LOS: 27 ?ADMISSION DATE:  03/30/2021, CONSULTATION DATE:  1/19 ?REFERRING MD:  Parashos, CHIEF COMPLAINT:  Found down, cardiac arrest  ? ?Brief Pt Description / Synopsis:  ?51 year old male with out-of-hospital V. fib cardiac arrest in setting of respiratory arrest due to suspected angioedema from ACE inhibitor leading to asphyxiation.  Concern on his 12 lead EKG for anterolateral ST elevation so he was taken to the cath lab where his study showed no significant coronary artery disease and an LVEF < 20%  Now with anoxic brain injury. ? ?Pertinent  Medical History  ?Hypertension ? ?Significant Hospital Events: ?Including procedures, antibiotic start and stop dates in addition to other pertinent events   ?1/19 admission, left heart cath without significant CAD.  Concern for anoxic brain injury ?1/20: MRI pending, Neuro following ?1/21: MRI shows areas of hypoxic-ischemic injury bilaterally. Fevered; cultures obtained. ?1/23 severe brain damage ?1/24 evidence of severe brain damage, remains critically ill ?1/25: Plan for PEG today. Neuro exam unchanged.  Does exhibit new eyelid tremor like movements and lip/jaw tremor like vs clonus movement.  Bolused Keppra and increased maintenance dose.  Repeat EEG. CTH: concerning for progression of anoxic injury.  ?1/26: Repeat EEG unchanged and continues to show myoclonic seizures despite addition of Valproic acid yesterday. transfer to [facility] for continuous EEG ?1/28: Continues to have myoclonic sz on EEG ?1/29 No acute issues overnight ?2/3 no meaningful neuro recovery. Diuresed ?2/5 unchanged ?2/6 New fever to 102, WBC 16K - treated with vancomycin for 7d based on cultures.  ?2/11: percutaneous tracheostomy ?2/13 >2/20 on trach collar. No vent.  ?2/20 Trach change to 6 cuffless ?2/27 status unchanged ?3/13 unchanged ? ?Interim History / Subjective:  ?Sounds like secretions are a little better than at time of last  visit ? ? ?Objective   ?Blood pressure (!) 137/101, pulse 98, temperature 99.4 ?F (37.4 ?C), temperature source Oral, resp. rate 20, height 5\' 10"  (1.778 m), weight 99 kg, SpO2 96 %. ?   ?FiO2 (%):  [28 %] 28 %  ? ?Intake/Output Summary (Last 24 hours) at 05/15/2021 0913 ?Last data filed at 05/15/2021 0451 ?Gross per 24 hour  ?Intake 0 ml  ?Output 1650 ml  ?Net -1650 ml  ? ? ?Filed Weights  ? 05/10/21 0448 05/11/21 0400 05/12/21 0423  ?Weight: 101 kg 99 kg 99 kg  ? ?Examination:  ?General appearance: 51 y.o., male, unresponsive  ?Eyes: PERRL, not tracking ?Neck: 6 cuffless shiley in place with inner cannula ?Lungs: A little rhonchorous bl, with normal respiratory effort ?CV: RRR, no murmur  ?Abdomen: Soft, non-tender; non-distended, BS present  ?Extremities: LLE edema > RLE, warm ?Neuro: No chagne ? ? ? ?Labs reviewed.  ?3/12 BMP stable ?3/12 CBC without leukocytosis ? ?No new culture data ? ?Ancillary tests personally reviewed:    ? ?Assessment & Plan:  ?OOH Vfib arrest ?Post arrest anoxic brain injury, severe ?Post arrest myoclonus improved with klonopin, no AEDs recommended ?Post arrest respiratory failure with prolonged mechanical ventilation s/p tracheostomy ?Post arrest cardiomyopathy ?Post arrest AKI, improved/stable ?HTN- stable on current regimen ?HCAP w/ MSSA- ABX > 2/21 ?Likely GPC bacteremia ? ? ?Plan:  ?-With asymmetric edema L>R would check DVT US (ordered) ?-Con't routine trach care ?-Con't trach collar ?-Will keep 6 shiley cuffless. No plans to decannulate unless there is a very significant improvement in mental status, which seems unlikely based on his trajectory so far. He is high risk for recurrent respiratory failure  episodes due to mental status and the ability to rescue suction and maintain his airway will be important for his ongoing care. ?-Appreciate SLP's care. Only approved for PMV with SLP supervision. Concern for air trapping ? ?PCCM will follow weekly for trach management. Please call if  questions arise in the interim. ? ?Best Practice (right click and "Reselect all SmartList Selections" daily)  ?Per trh ? ? ?Maryjane Hurter, MD ?[facility] ?See Amion for contact info ?05/15/2021 9:13 AM ? ? ? ? ? ? ? ? ? ? ? ? ? ? ? ? ? ? ?

## 2021-05-15 NOTE — Plan of Care (Signed)
?  Problem: Clinical Measurements: ?Goal: Respiratory complications will improve ?Outcome: Progressing ?  ?Problem: Elimination: ?Goal: Will not experience complications related to urinary retention ?Outcome: Progressing ?  ?Problem: Safety: ?Goal: Ability to remain free from injury will improve ?Outcome: Progressing ?  ?

## 2021-05-15 NOTE — Progress Notes (Signed)
HOSPITAL MEDICINE OVERNIGHT EVENT NOTE   ? ?Patient spouse requested that we contact the patient's mother with an update on the results of the left lower extremity ultrasound. ? ?I called the mother and updated her on the abnormal ultrasound findings.  I informed her about the plan to place patient on Eliquis and treat while monitoring for improvement in the left leg swelling and ensuring there are no thromboembolic complications going forward ? ?Mother additionally inquired about the current plan for the patient's recently treated pneumonia.  I updated her based on documentation by the daytime team. ? ?Marinda Elk  MD ?[facility]  ? ? ? ? ? ? ? ? ? ? ?

## 2021-05-15 NOTE — Significant Event (Signed)
Patient Coughing mucus and soiled with stool. Patient was cleaned and repositioned with significant other   present.  ?Called Rt to change trach collar. ?

## 2021-05-15 NOTE — Progress Notes (Signed)
VASCULAR LAB ? ? ? ?Bilateral lower extremity venous duplex has been performed. ? ?See CV proc for preliminary results. ? ?Messaged results to Dr. Rebekah Chesterfield via secure chat ? ?Ritchard Paragas, RVT ?05/15/2021, 11:57 AM ? ? ?

## 2021-05-16 DIAGNOSIS — I82409 Acute embolism and thrombosis of unspecified deep veins of unspecified lower extremity: Secondary | ICD-10-CM | POA: Diagnosis present

## 2021-05-16 MED ORDER — CHLORHEXIDINE GLUCONATE 0.12 % MT SOLN
15.0000 mL | Freq: Two times a day (BID) | OROMUCOSAL | Status: DC
  Administered 2021-05-16 – 2021-10-04 (×280): 15 mL via OROMUCOSAL
  Filled 2021-05-16 (×279): qty 15

## 2021-05-16 MED ORDER — OSMOLITE 1.5 CAL PO LIQD
355.0000 mL | Freq: Four times a day (QID) | ORAL | Status: DC
  Administered 2021-05-16 – 2021-08-23 (×386): 355 mL
  Administered 2021-08-24: 237 mL
  Administered 2021-08-24 – 2021-09-01 (×31): 355 mL
  Filled 2021-05-16 (×443): qty 474

## 2021-05-16 NOTE — Plan of Care (Signed)
  Problem: Pain Managment: Goal: General experience of comfort will improve Outcome: Progressing   Problem: Safety: Goal: Ability to remain free from injury will improve Outcome: Progressing   

## 2021-05-16 NOTE — Assessment & Plan Note (Addendum)
Duplex ultrasound of lower extremity on 05/15/2021 showed age indeterminate deep vein thrombosis involving the left femoral vein, left proximal profunda vein, left posterior tibial veins, left popliteal vein, and left peroneal veins. Started on new Eliquis.

## 2021-05-16 NOTE — Progress Notes (Signed)
Nutrition Follow-up ? ?DOCUMENTATION CODES:  ? ?Not applicable ? ?INTERVENTION:  ? ?Continue bolus Tube Feeds via PEG: ?Change to Osmolite 1.5 355 mL - QID (1 1/2 cartons/bolus) due to manufacture backorder  ?45 mL ProSource TF - BID ?Continue 400 mL free water q6h per MD ?Provides 2210 kcal, 111 gm PRO, and 1086 mL free water (2686 mL total free water) daily.  ? ?NUTRITION DIAGNOSIS:  ? ?Inadequate oral intake related to inability to eat as evidenced by NPO status. - Ongoing, being addressed via TF ? ?GOAL:  ? ?Patient will meet greater than or equal to 90% of their needs - Being addressed via TF ? ?MONITOR:  ? ?Labs, Weight trends, TF tolerance, Skin ? ?REASON FOR ASSESSMENT:  ? ?Ventilator, Consult ?Enteral/tube feeding initiation and management ? ?ASSESSMENT:  ? ?51 year old male who originally presented to [facility] on 1/19 after out-of-hospital cardiac arrest due to suspected angioedema from ACE inhibitor leading to asphyxiation. PMH of HTN, HLD. ? ?2/11 - tracheostomy placed ?2/22 - PEG placed  ? ?RD observed Jevity 1.5 liter bottle at bedside and pt on bolus feeds. Reached out to pharmacy, there is currently a Print production planner on Jevity 1.5 in 8 oz cartons. Will switch to alternative formula.  ?Spoke with RN over phone to discuss formula change. RN reports no intolerance noted to TF at this time.  ? ?Medications reviewed and include: Fiber, Protonix ?Labs reviewed. ? ?Diet Order:   ? ? None  ? ?EDUCATION NEEDS:  ? ?Not appropriate for education at this time ? ?Skin:  Skin Assessment: Skin Integrity Issues: ?Skin Integrity Issues:: Other (Comment) ?Other: MASD buttocks ? ?Last BM:  3/12 - Type 6 ? ?Height:  ? ?Ht Readings from Last 1 Encounters:  ?04/18/21 5\' 10"  (1.778 m)  ? ? ?Weight:  ? ?Wt Readings from Last 1 Encounters:  ?05/16/21 99 kg  ? ? ?Ideal Body Weight:  75.5 kg ? ?BMI:  Body mass index is 31.32 kg/m?. ? ?Estimated Nutritional Needs:  ? ?Kcal:  2200-2400 ? ?Protein:  110-125 grams ? ?Fluid:  >/=  2.2 L ? ? ?05/18/21 RD, LDN ?Clinical Dietitian ?See AMiON for contact information.  ? ?

## 2021-05-16 NOTE — TOC Progression Note (Signed)
Transition of Care (TOC) - Progression Note  ? ? ?Patient Details  ?Name: Billy Cherry ?MRN: [id number] ?Date of Birth: 1970-03-29 ? ?Transition of Care (TOC) CM/SW Contact  ?Royston Bake, RN ?Phone Number: ?05/16/2021, 3:48 PM ? ?Clinical Narrative:    ?Talked to spouse via cell [phone]). Ms Mcquown works 10 hour shifts and has custody of their 51 yr old grandson. She has difficulty visiting patient during the day and can only see him after work. She stated that she has been sick but did arrive to the hospital yesterday for teaching with the Resp Therapist on trach care. She states that she is comfortable providing care for the patient . Home Care services will be provided by [facility] Leonor Liv) [phone]; DME will be provided by [facility] when patient is medically ready for discharge. She will also have other family member for additional support in the home. ? ? ?Expected Discharge Plan: [place] ?Barriers to Discharge: Barriers Resolved ? ?Expected Discharge Plan and Services ?Expected Discharge Plan: [place] ?In-house Referral: Hospice / Palliative Care ?Discharge Planning Services: CM Consult ?Post Acute Care Choice: Long Term Acute Care (LTAC) ?Living arrangements for the past 2 months: Day ?                ?DME Arranged: N/A ?DME Agency: [facility] ?  ?  ?  ?HH Arranged: Nurse's Aide ?[place] Agency: [place] ?  ?  ?Representative spoke with at [place]: [name] ? ? ?Social Determinants of Health (SDOH) Interventions ?  ? ?Readmission Risk Interventions ?Readmission Risk Prevention Plan 04/14/2021  ?Transportation Screening Complete  ?[place] or Home Care Consult Complete  ?Social Work Consult for [place] Planning/Counseling Complete  ?Palliative Care Screening Complete  ?Medication Review Press photographer) Complete  ?Some recent data might be hidden  ? ? ?

## 2021-05-16 NOTE — Progress Notes (Addendum)
Occupational Therapy Treatment ?Patient Details ?Name: Billy Cherry ?MRN: [id number] ?DOB: 1970/07/05 ?Today's Date: 05/16/2021 ? ? ?History of present illness 51 y.o. male who presented 03/23/21 as a code STEMI after being found unresponsive. V. fib cardiac arrest in setting of respiratory arrest due to suspected angioedema from ACE inhibitor leading to asphyxiation.  Concern on his 12 lead EKG for anterolateral ST elevation so he was taken to the cath lab where his study showed no significant coronary artery disease and an LVEF < 20%. 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 S/p cortak placement 1/27, trach placement 2/11. ETT 1/19 - 2/11. PMH: HTN ?  ?OT comments ? Due to illness, pt's wife unable to attend family education meeting on Sunday afternoon (fourth missed established/scheduled family education session). Called pt's wife, [name], to complete family education via phone. Provided education and descriptions of ROM exercises for BUE/BLEs. Pt's wife verbalized understanding. Also providing education on positional change in bed and elevation of BUEs for edema management and wound prevention; Pt's wife verbalized understanding. Answered all questions regarding therapy. Left HEP handout in patient's room. All acute OT needs met. Will sign off. Thank you.   ? ?Recommendations for follow up therapy are one component of a multi-disciplinary discharge planning process, led by the attending physician.  Recommendations may be updated based on patient status, additional functional criteria and insurance authorization. ?   ?Follow Up Recommendations ? OT at Long-term acute care hospital  ?  ?Assistance Recommended at Discharge Frequent or constant Supervision/Assistance  ?Patient can return home with the following ? Other (comment) ?  ?Equipment Recommendations ? Hospital bed;Other (comment)  ?   ?Recommendations for Other Services   ? ?  ?Precautions / Restrictions    ? ? ?  ? ? ?General Comments Calling pt's wife, [name], and provided education on HEP for rom of BUE/BLEs as well as education on positional changes in bed. Ms. [name] verbalized understanding.  ? ? ? ? ?Frequency ? Min 1X/week  ? ? ? ? ?  ?Progress Toward Goals ? ?OT Goals(current goals can now be found in the care plan section) ?   ? ?Acute Rehab OT Goals ?OT Goal Formulation: All assessment and education complete, DC therapy ?ADL Goals ?Pt Will Perform Grooming: with max assist;bed level ?Additional ADL Goal #1: Pt will be independent with PROM adn positioning to reduce riskof contracture and improve skin integrity ?Additional ADL Goal #2: Pt will demonstrate focused visual attention to object/face for 30 secs in preparation for ADLs  ?Plan All goals met and education completed, patient discharged from OT services   ? ?Co-evaluation ? ? ?   ?  ?  ?  ?  ? ?  ?AM-PAC OT "6 Clicks" Daily Activity     ?Outcome Measure ? ? Help from another person eating meals?: Total ?Help from another person taking care of personal grooming?: Total ?Help from another person toileting, which includes using toliet, bedpan, or urinal?: Total ?Help from another person bathing (including washing, rinsing, drying)?: Total ?Help from another person to put on and taking off regular upper body clothing?: Total ?Help from another person to put on and taking off regular lower body clothing?: Total ?6 Click Score: 6 ? ?  ?End of Session   ? ?OT Visit Diagnosis: Unsteadiness on feet (R26.81);Other abnormalities of gait and mobility (R26.89);Muscle weakness (generalized) (M62.81) ?  ?Activity Tolerance   ?  ?  Patient Left   ?  ?Nurse Communication   ?  ? ?   ? ?Time: 7741-4239 ?OT Time Calculation (min): 9 min ? ?Charges: OT General Charges ?$OT Visit: 1 Visit ?OT Treatments ?$Self Care/Home Management : 8-22 mins ? ?Deborra Phegley MSOT, OTR/L ?Acute Rehab ?Pager:  [phone] ?Office: [phone] ? ?Keyshawn Hellwig M Maddelynn Moosman ?05/16/2021, 1:51 PM ? ? ?

## 2021-05-16 NOTE — Progress Notes (Addendum)
?PROGRESS NOTE ? ? ? ?Destiny Trickey  QJJ:[id number] DOB: 08/17/1970 DOA: 03/30/2021 ?PCP: Patient, No Pcp Per (Inactive)  ? ? ?Brief Narrative:  ?51 years old male with past medical history of hypertension was brought into the hospital after being found unresponsive by his wife.  EMS was called in and patient was noted to be in ventricular fibrillation with agonal respirations.  Patient was given CPR, epinephrine and had return of spontaneous circulation.  EKG showed anterolateral ST elevation and was taken to Cath Lab. Dr Josefa Half performed cath> insignificant CAD and EF 45-50%. Patient remained on ventilator after cath and was admitted to the ICU. Underwent TTM.  Neurology was consulted due to rhythmic twitching of his lips tongue and was started on Keppra.  MRI of the brain showed evidence of hypoxia with bilateral ACA MCA watershed infarcts.  2D echocardiogram showed LV ejection fraction of 20% with global hypokinesis.  Patient was also treated for ventilator associated pneumonia.  At this time, patient has undergone tracheostomy and PEG tube placement.  Patient did have a spike of fever on 2/28 thought to be secondary to aspiration and was on Fortaz since respiratory culture was growing Pseudomonas.  Blood culture however grew out MRSE in 3 out of 3 bottles.  Infectious disease was consulted ,repeat blood cultures were sent and patient is being observed off antibiotic.  Patient persisted to have fever during hospitalization so infectious disease was reconsulted.  Patient underwent ultrasound of the lower extremities which showed DVT on the left lower extremity.  Eliquis was started 05/15/2021  ? ?  ?Assessment and Plan: ?* Cardiac arrest [facility]) ?Postarrest anoxic encephalopathy ?Postarrest respiratory failure s/p TC ?S/P PEG tube placement ?Postarrest myoclonus: ?2D echocardiogram showed reduced LV function at 20%.  Continue tracheostomy with trach collar.  Continue PEG tube feeding.  Remains obtunded with anoxic  encephalopathy-on Keppra, for myoclonus and can increase Klonopin for breakthrough myoclonus.  Continue supportive care.  At this time, plan is home with home health-Home health/patient's wife and family will be taking turns to take care of him he will need around-the-clock care.Enteral tube feeding trach kit and supplies to be  arranged. ? 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  Patient is unresponsive and has shown no improvement. ? ?RLL pneumonia ?  Sepsis Previously had completed vancomycin for MSSA and [name].  Received more than 7-day course of IV Fortaz.  Infectious disease on board and patient is currently being observed without antibiotic.   ? ?Low-grade fever.Temperature max of 100.5?F.  Continue respiratory care.Patient is at a very high risk of recurrent pneumonia.   WBC at 10.3. Blood cultures from 05/09/2021 negative in 5 days. Communicated with Infectious disasese about the persistent fever.  Lower extremity ultrasound shows indeterminate DVT.  Could explain some fever. ? ?Acute respiratory failure ([place]) ?Status post trach collar.  Continue supportive care ? ?Staphylococcus epidermidis bacteremia ?MRSE bacteremia.  Infectious disease recommended off antibiotic.  Cultures negative so far for ? ?DVT (deep venous thrombosis) ([place]) ?Duplex ultrasound of lower extremity on 05/15/2021 showed age indeterminate deep vein thrombosis involving the left femoral vein, left proximal profunda vein, left posterior tibial veins, left popliteal vein, and left peroneal veins.  Due to persistent fever Eliquis has been initiated since yesterday. ? ?Dysphagia ?Continue PEG tube feeding.  Tolerating. ? ?Obesity, Class I, BMI 30-34.9 ?BMI 33. ? ?HTN (hypertension) ?Continue Coreg Isordil amlodipine Cardura hydralazine and PRN Labetalol.  Blood pressure seems to be elevated recently. Dose of hydralazine to 100 mg  3 times daily from 05/13/21. ? ?HFrEF (heart failure with reduced ejection  fraction) ([place]) ?2D echocardiogram-LVEF less than 20%, continue coreg, imdur, amlodipine, doxazosin, hydralazine, labetalol as needed ? ?Hypernatremia ?Continue free water 400 cc Q 6 hrs for now sodium level has normalized at this time. ? ?Myoclonus ?Seen by neurology.  Continue current AED with Klonopin, Keppra. per neuro notes, suppressing his myoclonus will not ultimately change the prognosis  ? ?Nutrition through the PEG tube.  Continue water flushes. ? ? DVT prophylaxis:  ?apixaban (ELIQUIS) tablet 10 mg  ?apixaban (ELIQUIS) tablet 5 mg  ? ?Code Status:   ?  Code Status: Full Code ? ?Disposition: Home versus long-term care, uncertain disposition. ? ?Status is: Inpatient ? ?Remains inpatient appropriate because:  low-grade fever, being observed off antibiotic,  pending disposition plan. ? ? Family Communication:  ?I spoke with the patient's wife on the phone and updated her about the clinical condition of the patient. She is working on figuring out things for him.  ? ?Consultants:  ?Palliative,  ?ENT,  ?GI,  ?neuro,  ?cardiology,  ?PCCM,  ?Infectious disease ? ?Procedures:  ?IR guided gastrostomy tube placement on 04/26/2021 ?Endotracheal tube placement mechanical ventilation. ?Tracheostomy  ? ?Antimicrobials:  ?Tressie Ellis 05/02/2021>3/8 ?Vancomycin 05/08/21>05/09/21 ? ?Anti-infectives (From admission, onward)  ? ? Start     Dose/Rate Route Frequency Ordered Stop  ? 05/09/21 0800  vancomycin (VANCOREADY) IVPB 1250 mg/250 mL  Status:  Discontinued       ? 1,250 mg ?166.7 mL/hr over 90 Minutes Intravenous Every 12 hours 05/08/21 1621 05/09/21 0954  ? 05/08/21 1715  vancomycin (VANCOREADY) IVPB 2000 mg/400 mL       ? 2,000 mg ?200 mL/hr over 120 Minutes Intravenous  Once 05/08/21 1618 05/08/21 1943  ? 05/02/21 1000  cefTAZidime (FORTAZ) 2 g in sodium chloride 0.9 % 100 mL IVPB  Status:  Discontinued       ? 2 g ?200 mL/hr over 30 Minutes Intravenous Every 8 hours 05/02/21 0846 05/10/21 0936  ? 04/26/21 0756  ceFAZolin  (ANCEF) 2-4 GM/100ML-% IVPB  Status:  Discontinued       ?Note to Pharmacy: [name] L: cabinet override  ?    04/26/21 0756 04/26/21 0855  ? 04/26/21 0600  ceFAZolin (ANCEF) IVPB 2g/100 mL premix       ? 2 g ?200 mL/hr over 30 Minutes Intravenous Every 8 hours 04/25/21 1131 04/26/21 0915  ? 04/23/21 1400  cefTAZidime (FORTAZ) 1 g in sodium chloride 0.9 % 100 mL IVPB       ? 1 g ?200 mL/hr over 30 Minutes Intravenous Every 8 hours 04/23/21 1256 04/25/21 2331  ? 04/20/21 1300  ceFEPIme (MAXIPIME) 2 g in sodium chloride 0.9 % 100 mL IVPB  Status:  Discontinued       ? 2 g ?200 mL/hr over 30 Minutes Intravenous Every 8 hours 04/20/21 1205 04/23/21 1241  ? 04/14/21 0930  vancomycin (VANCOREADY) IVPB 1250 mg/250 mL       ? 1,250 mg ?166.7 mL/hr over 90 Minutes Intravenous Every 24 hours 04/13/21 0902 04/19/21 1247  ? 04/13/21 0930  vancomycin (VANCOREADY) IVPB 2000 mg/400 mL       ? 2,000 mg ?200 mL/hr over 120 Minutes Intravenous  Once 04/13/21 0831 04/13/21 1158  ? 04/12/21 0815  ceFEPIme (MAXIPIME) 2 g in sodium chloride 0.9 % 100 mL IVPB  Status:  Discontinued       ? 2 g ?200 mL/hr over 30 Minutes Intravenous Every 8  hours 04/12/21 0724 04/13/21 0829  ? 04/06/21 1800  ceFAZolin (ANCEF) IVPB 2g/100 mL premix       ? 2 g ?200 mL/hr over 30 Minutes Intravenous Every 8 hours 04/06/21 1403 04/10/21 1913  ? 04/04/21 1000  ceFEPIme (MAXIPIME) 2 g in sodium chloride 0.9 % 100 mL IVPB  Status:  Discontinued       ? 2 g ?200 mL/hr over 30 Minutes Intravenous Every 8 hours 04/04/21 0919 04/06/21 1403  ? ?  ? ?Subjective: ? ?Today, patient was seen and examined at bedside.  No interval complaints reported.  Nonverbal.   ? ?Objective: ?Vitals:  ? 05/16/21 0800 05/16/21 0848 05/16/21 1000 05/16/21 1216  ?BP: 113/88  106/74   ?Pulse: 86 86 86 89  ?Resp: (!) 28 (!) 27 (!) 32 (!) 24  ?Temp:      ?TempSrc:      ?SpO2: 100% 100% 98% 99%  ?Weight:      ?Height:      ? ? ?Intake/Output Summary (Last 24 hours) at 05/16/2021  1247 ?Last data filed at 05/16/2021 0500 ?Gross per 24 hour  ?Intake 0 ml  ?Output 1400 ml  ?Net -1400 ml  ? ?Filed Weights  ? 05/11/21 0400 05/12/21 0423 05/16/21 0500  ?Weight: 99 kg 99 kg 99 kg  ? ?Body mass index is

## 2021-05-17 ENCOUNTER — Other Ambulatory Visit (HOSPITAL_COMMUNITY): Payer: Self-pay

## 2021-05-17 DIAGNOSIS — R509 Fever, unspecified: Secondary | ICD-10-CM

## 2021-05-17 LAB — CBC
HCT: 32.4 % — ABNORMAL LOW (ref 39.0–52.0)
Hemoglobin: 10.8 g/dL — ABNORMAL LOW (ref 13.0–17.0)
MCH: 31.8 pg (ref 26.0–34.0)
MCHC: 33.3 g/dL (ref 30.0–36.0)
MCV: 95.3 fL (ref 80.0–100.0)
Platelets: 275 10*3/uL (ref 150–400)
RBC: 3.4 MIL/uL — ABNORMAL LOW (ref 4.22–5.81)
RDW: 15.9 % — ABNORMAL HIGH (ref 11.5–15.5)
WBC: 9.8 10*3/uL (ref 4.0–10.5)
nRBC: 0 % (ref 0.0–0.2)

## 2021-05-17 NOTE — TOC Benefit Eligibility Note (Signed)
Patient Advocate Encounter ? ?Insurance verification completed.   ? ?The patient is currently admitted and upon discharge could be taking Eliquis Starter Pack. ? ?The current 30 day co-pay is, $158.94.  ? ?The patient is insured through [employer]  ? ? ? ?Lyndel Safe, CPhT ?Pharmacy Patient Advocate Specialist ?[facility] Patient Advocate Team ?Direct Number: [phone]  Fax: [phone] ? ? ? ? ? ?  ?

## 2021-05-17 NOTE — Assessment & Plan Note (Addendum)
Low-grade fever.Temperature max of 100.5.?F.  Continue respiratory care. Patient is at a very high risk of recurrent pneumonia.  Latest WBC at 9.8.Marland Kitchen Blood cultures from 05/09/2021 negative.  Staph epidermidis in the previous culture which was thought to be contaminant.  Seen by  with Infectious disasese due to persistent fever.  No recommendation for antibiotic and further work-up.  Lower extremity ultrasound showed indeterminate DVT.   ? ?-Continues to have persistent fevers of 100 degrees with associated tachycardia ?-Chest x-ray without any evidence of recurrent pneumonia ?-Urinalysis without signs of infection ?-Suspect that atelectasis may be contributing ?-Continue to monitor ?

## 2021-05-17 NOTE — Progress Notes (Signed)
Speech Language Pathology Treatment:    ?Patient Details ?Name: Billy Cherry ?MRN: [id number] ?DOB: Nov 17, 1970 ?Today's Date: 05/17/2021 ?Time:  -  ?  ? ? ?SLP had been seeing pt once a week for speaking valve. Pt's respiratory state was increased last session and did not tolerate valve. He is non verbal and currently would not recommend family place valve at this time due to fluctuating tolerance. If he becomes communicative or if there are thoughts valve may assist in decannulation in anyway, would recommend home health ST services. At this time, ST will sign off.  ?   ?    ?   ? ? ? ?   ? ? ? ? ?  ?  ? ? ?Royce Macadamia ? ?05/17/2021, 3:01 PM ?

## 2021-05-17 NOTE — Progress Notes (Signed)
?PROGRESS NOTE ? ? ? ?Billy Cherry  LDJ:[id number] DOB: [birth year] DOA: 03/30/2021 ?PCP: Patient, No Pcp Per (Inactive)  ? ? ?Brief Narrative:  ?51 years old male with past medical history of hypertension was brought into the hospital after being found unresponsive by his wife.  EMS was called in and patient was noted to be in ventricular fibrillation with agonal respirations.  Patient was given CPR, epinephrine and had return of spontaneous circulation.  EKG showed anterolateral ST elevation and was taken to Cath Lab. Dr Josefa Half performed cath> insignificant CAD and EF 45-50%. Patient remained on ventilator after cath and was admitted to the ICU. Underwent TTM.  Neurology was consulted due to rhythmic twitching of his lips tongue and was started on Keppra.  MRI of the brain showed evidence of hypoxia with bilateral ACA MCA watershed infarcts.  2D echocardiogram showed LV ejection fraction of 20% with global hypokinesis.  Patient was also treated for ventilator associated pneumonia.  At this time, patient has undergone tracheostomy and PEG tube placement.  Patient did have a spike of fever on 2/28 thought to be secondary to aspiration and was on Fortaz since respiratory culture was growing Pseudomonas.  Blood culture however grew out MRSE in 3 out of 3 bottles.  Infectious disease was consulted ,repeat blood cultures were sent and patient is being observed off antibiotic.  Patient persisted to have fever during hospitalization so infectious disease was reconsulted.  Patient underwent ultrasound of the lower extremities which showed DVT on the left lower extremity.  Eliquis was started 05/15/2021  ? ?  ?Assessment and Plan: ?* Cardiac arrest [facility]) ?Postarrest anoxic encephalopathy ?Postarrest respiratory failure s/p TC ?S/P PEG tube placement ?Postarrest myoclonus: ?2D echocardiogram showed reduced LV function at 20%.  Continue tracheostomy with trach collar.  Continue PEG tube feeding.  Remains obtunded with anoxic  encephalopathy-on Keppra, for myoclonus and can increase Klonopin for breakthrough myoclonus.  Continue supportive care.  At this time, plan is home with home health-Home health/patient's wife and family will be taking turns to take care of him he will need around-the-clock care.Enteral tube feeding trach kit and supplies to be  arranged. ? 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  Patient is unresponsive and has shown no improvement. ? ?Acute respiratory failure ([place]) ?Status post trach collar.  Continue supportive care ? ?RLL pneumonia ?  Sepsis Previously had completed vancomycin for MSSA and [name].  Received more than 7-day course of IV Fortaz.  Infectious disease on board and patient is currently being observed without antibiotic.   ? ? ?Fever ? Low-grade fever.Temperature max of 99.5?F.  Continue respiratory care. Patient is at a very high risk of recurrent pneumonia.   WBC at 9.8. Blood cultures from 05/09/2021 negative.  Staph epidermidis in the previous culture which was thought to be contaminant.  Seen by  with Infectious disasese due to persistent fever.  No recommendation for antibiotic and further work-up.  Lower extremity ultrasound showed indeterminate DVT.  Could explain some fever. ? ?DVT (deep venous thrombosis) ([place]) ?Duplex ultrasound of lower extremity on 05/15/2021 showed age indeterminate deep vein thrombosis involving the left femoral vein, left proximal profunda vein, left posterior tibial veins, left popliteal vein, and left peroneal veins.  Due to persistent fever Eliquis has been initiated since yesterday. ? ?Dysphagia ?Continue PEG tube feeding.  Tolerating. ? ?Obesity, Class I, BMI 30-34.9 ?BMI 33. ? ?HTN (hypertension) ?Continue Coreg Isordil amlodipine Cardura hydralazine and PRN Labetalol.  Blood pressure seems to be elevated  recently. Dose of hydralazine to 100 mg 3 times daily from 05/13/21. ? ?HFrEF (heart failure with reduced ejection fraction)  ([place]) ?2D echocardiogram-LVEF less than 20%, continue coreg, imdur, amlodipine, doxazosin, hydralazine, labetalol as needed, patient is negative balance for 2918 mL. ? ?Hypernatremia ?Continue free water 400 cc Q 6 hrs for now sodium level has normalized at this time. ? ?Myoclonus ?Seen by neurology.  Continue current AED with Klonopin, Keppra. per neuro notes, suppressing his myoclonus will not ultimately change the prognosis  ? ?Nutrition through the PEG tube.  Continue water flushes. ? ? DVT prophylaxis:  ?apixaban (ELIQUIS) tablet 10 mg  ?apixaban (ELIQUIS) tablet 5 mg  ? ?Code Status:   ?  Code Status: Full Code ? ?Disposition: Home versus long-term care, uncertain disposition. ? ?Status is: Inpatient ? ?Remains inpatient appropriate because:  low-grade fever, being observed off antibiotic,  pending disposition plan. ? ? Family Communication:  ?I spoke with the patient's wife on the phone and updated her about the clinical condition of the patient on 05/16/21. She is working on figuring out things for him.  ? ?Consultants:  ?Palliative,  ?ENT,  ?GI,  ?neuro,  ?cardiology,  ?PCCM,  ?Infectious disease ? ?Procedures:  ?IR guided gastrostomy tube placement on 04/26/2021 ?Endotracheal tube placement mechanical ventilation. ?Tracheostomy  ? ?Antimicrobials:  ?Tressie Ellis 05/02/2021>3/8 ?Vancomycin 05/08/21>05/09/21 ? ?Subjective: ? ?Today, patient was seen and examined at bedside.  No interval complaints reported.  No meaningful recovery.  Nonverbal. ? ?Objective: ?Vitals:  ? 05/17/21 0732 05/17/21 0830 05/17/21 1110 05/17/21 1130  ?BP: 113/78  112/74   ?Pulse: 85 99 88 89  ?Resp: (!) 23 (!) 31 (!) 28 (!) 29  ?Temp: 98.3 ?F (36.8 ?C)     ?TempSrc: Axillary     ?SpO2: 100% 96% 97% 99%  ?Weight:      ?Height:      ? ? ?Intake/Output Summary (Last 24 hours) at 05/17/2021 1230 ?Last data filed at 05/17/2021 0615 ?Gross per 24 hour  ?Intake 315 ml  ?Output 1500 ml  ?Net -1185 ml  ? ?Filed Weights  ? 05/12/21 0423 05/16/21 0500  05/17/21 0214  ?Weight: 99 kg 99 kg 98 kg  ? ?Body mass index is 31 kg/m?.  ? ?Physical Examination: ? ?General: Obese built, not in obvious distress, chronically ill, nonverbal, deconditioned, opens eyes spontaneously, no purposeful movement ?HENT:   No scleral pallor or icterus noted. Oral mucosa is moist.  Tracheostomy in place. ?Chest:   Diminished breath sounds bilaterally. No crackles or wheezes.  ?CVS: S1 &S2 heard. No murmur.  Regular rate and rhythm. ?Abdomen: Soft, nontender, nondistended.  Bowel sounds are heard.  PEG tube in place. ?Extremities: No cyanosis, clubbing or edema.  Peripheral pulses are palpable. ?Psych:, Not responsive, nonverbal,  ?CNS: non-responsive, nonverbal, flaccid extremities. ?Skin: Warm and dry.  No rashes noted. ? ?CBC: ?Recent Labs  ?Lab 05/11/21 ?6578 05/14/21 ?0336 05/17/21 ?0407  ?WBC 11.4* 10.3 9.8  ?HGB 12.3* 11.4* 10.8*  ?HCT 39.1 34.1* 32.4*  ?MCV 100.0 95.0 95.3  ?PLT 184 231 275  ? ? ?Basic Metabolic Panel: ?Recent Labs  ?Lab 05/11/21 ?4696 05/14/21 ?0336  ?NA 144 135  ?K 3.7 4.4  ?CL 106 98  ?CO2 27 31  ?GLUCOSE 107* 98  ?BUN 26* 21*  ?CREATININE 0.66 0.67  ?CALCIUM 8.4* 8.2*  ?MG 2.0 2.0  ? ? ?Liver Function Tests: ?No results for input(s): AST, ALT, ALKPHOS, BILITOT, PROT, ALBUMIN in the last 168 hours. ? ? ? ?Radiology Studies: ?  No results found. ? ? ? LOS: 48 days  ? ? ?Flora Lipps, MD ?[facility] ?05/17/2021, 12:30 PM  ?  ?

## 2021-05-18 NOTE — Progress Notes (Signed)
?PROGRESS NOTE ? ? ? ?Billy Cherry  OEU:[id number] DOB: 01/03/1971 DOA: 03/30/2021 ?PCP: Patient, No Pcp Per (Inactive)  ? ? ?Brief Narrative:  ?51 years old male with past medical history of hypertension was brought into the hospital after being found unresponsive by his wife.  EMS was called in and patient was noted to be in ventricular fibrillation with agonal respirations.  Patient was given CPR, epinephrine and had return of spontaneous circulation.  EKG showed anterolateral ST elevation and was taken to Cath Lab. Dr Josefa Half performed cath> insignificant CAD and EF 45-50%. Patient remained on ventilator after cath and was admitted to the ICU. Underwent TTM.  Neurology was consulted due to rhythmic twitching of his lips tongue and was started on Keppra.  MRI of the brain showed evidence of hypoxia with bilateral ACA MCA watershed infarcts.  2D echocardiogram showed LV ejection fraction of 20% with global hypokinesis.  Patient was also treated for ventilator associated pneumonia.  Currently status post tracheostomy and PEG tube placement.  Patient did have a spike of fever on 2/28 thought to be secondary to aspiration and was on Fortaz since respiratory culture was growing Pseudomonas.  Blood culture however grew out MRSE in 3 out of 3 bottles.  Infectious disease was consulted ,repeat blood cultures were sent and patient is being observed off antibiotic.  Patient persisted to have fever during hospitalization so infectious disease was reconsulted.  Patient underwent ultrasound of the lower extremities which showed DVT on the left lower extremity.  Eliquis was started 05/15/2021.   ? ?  ?Assessment and Plan: ?* Cardiac arrest [facility]) ?Postarrest anoxic encephalopathy ?Postarrest respiratory failure s/p TC ?S/P PEG tube placement ?Postarrest myoclonus: ?2D echocardiogram showed reduced LV function at 20%.  Now status post trach and PEG.  Remains obtunded with anoxic encephalopathy-on Keppra, for myoclonus and can  increase Klonopin for breakthrough myoclonus.  At this time, plan is home with home health-Home health/patient's wife and family will be taking turns to take care of him he will need around-the-clock care. Enteral tube feeding trach kit and supplies to be  arranged. ? 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  Patient is not responsive and has shown no improvement. ? ?Acute respiratory failure ([place]) ?Status post trach collar.  Continue supportive care ? ?RLL pneumonia ?With Sepsis Previously had completed vancomycin for MSSA and [name].  Received more than 7-day course of IV Fortaz. ? ? ?Fever ? Low-grade fever.Temperature max of 100.3?F.  Continue respiratory care. Patient is at a very high risk of recurrent pneumonia.   WBC at 9.8. Blood cultures from 05/09/2021 negative.  Staph epidermidis in the previous culture which was thought to be contaminant.  Seen by  with Infectious disasese due to persistent fever.  No recommendation for antibiotic and further work-up.  Lower extremity ultrasound showed indeterminate DVT.  Could explain some fever. ? ?DVT (deep venous thrombosis) ([place]) ?Duplex ultrasound of lower extremity on 05/15/2021 showed age indeterminate deep vein thrombosis involving the left femoral vein, left proximal profunda vein, left posterior tibial veins, left popliteal vein, and left peroneal veins.  Due to persistent fever Eliquis has been initiated since yesterday. ? ?Dysphagia ?Continue PEG tube feeding.  Tolerating. ? ?Obesity, Class I, BMI 30-34.9 ?BMI 33. ? ?HTN (hypertension) ?Continue Coreg, Isordil, amlodipine, Cardura, hydralazine and PRN Labetalol. Dose of hydralazine to 100 mg 3 times daily from 05/13/21.  Blood pressure much improved. ? ?HFrEF (heart failure with reduced ejection fraction) ([place]) ?2D echocardiogram-LVEF less than 20%, continue  coreg, imdur, amlodipine, doxazosin, hydralazine, labetalol as needed, patient is negative balance for 2918  mL. ? ?Hypernatremia ?Continue free water 400 cc Q 6 hrs for now sodium level has normalized at this time. ? ?Myoclonus ?Seen by neurology.  Continue current AED with Klonopin, Keppra. per neuro notes, suppressing his myoclonus will not ultimately change the prognosis  ? ?Nutrition through the PEG tube.  Continue water flushes. ? ? DVT prophylaxis:  ?apixaban (ELIQUIS) tablet 10 mg  ?apixaban (ELIQUIS) tablet 5 mg  ? ?Code Status:   ?  Code Status: Full Code ? ?Disposition:  uncertain disposition. ? ?Status is: Inpatient ? ?Remains inpatient appropriate because: Need for tracheostomy and PEG tube care, pending Medicaid, low-grade fever, pending disposition plan. ? ? Family Communication:  ?I spoke with the patient's wife on the phone on 05/16/21. She is working on figuring out things for him.  ? ?Consultants:  ?Palliative,  ?ENT,  ?GI,  ?neuro,  ?cardiology,  ?PCCM,  ?Infectious disease ? ?Procedures:  ?IR guided gastrostomy tube placement on 04/26/2021 ?Endotracheal tube placement mechanical ventilation. ?Tracheostomy  ? ?Antimicrobials:  ?Tressie Ellis 05/02/2021>3/8 ?Vancomycin 05/08/21>05/09/21 ? ?Subjective: ? ?Today, patient was seen and examined at bedside.  Nonverbal.  No meaningful improvement over hospitalization.  Continues to have low-grade fever. ? ?Objective: ?Vitals:  ? 05/18/21 0748 05/18/21 1047 05/18/21 1048 05/18/21 1120  ?BP:   115/79   ?Pulse: 96  92 94  ?Resp: (!) 35  (!) 21 (!) 22  ?Temp:  99 ?F (37.2 ?C) 99 ?F (37.2 ?C)   ?TempSrc:   Oral   ?SpO2: 97%  93% 98%  ?Weight:      ?Height:      ? ? ?Intake/Output Summary (Last 24 hours) at 05/18/2021 1139 ?Last data filed at 05/18/2021 1000 ?Gross per 24 hour  ?Intake 415 ml  ?Output 3000 ml  ?Net -2585 ml  ? ?Filed Weights  ? 05/16/21 0500 05/17/21 0214 05/18/21 0400  ?Weight: 99 kg 98 kg 98 kg  ? ?Body mass index is 31 kg/m?.  ? ?Physical Examination: ? ?General: Obese built, not in obvious distress, chronically ill, nonverbal, opens eyes spontaneously, no  purposeful movement, ?HENT:   No scleral pallor or icterus noted. Oral mucosa is moist.  Tracheostomy in place ?Chest:    Diminished breath sounds bilaterally. No crackles or wheezes.  ?CVS: S1 &S2 heard. No murmur.  Regular rate and rhythm. ?Abdomen: Soft, nontender, nondistended.  Bowel sounds are heard.  PEG tube in place ?Extremities: No cyanosis, clubbing or edema.  Peripheral pulses are palpable. ?Psych: Nonresponsive, nonverbal ?CNS: Nonverbal, flaccid extremities ?Skin: Warm and dry.  No rashes noted. ? ?CBC: ?Recent Labs  ?Lab 05/14/21 ?0336 05/17/21 ?0407  ?WBC 10.3 9.8  ?HGB 11.4* 10.8*  ?HCT 34.1* 32.4*  ?MCV 95.0 95.3  ?PLT 231 275  ? ? ?Basic Metabolic Panel: ?Recent Labs  ?Lab 05/14/21 ?0336  ?NA 135  ?K 4.4  ?CL 98  ?CO2 31  ?GLUCOSE 98  ?BUN 21*  ?CREATININE 0.67  ?CALCIUM 8.2*  ?MG 2.0  ? ? ?Liver Function Tests: ?No results for input(s): AST, ALT, ALKPHOS, BILITOT, PROT, ALBUMIN in the last 168 hours. ? ? ? ?Radiology Studies: ?No results found. ? ? ? LOS: 49 days  ? ? ?Flora Lipps, MD ?[facility] ?05/18/2021, 11:39 AM  ?  ?

## 2021-05-18 NOTE — Progress Notes (Signed)
?   05/18/21 0740  ?Assess: MEWS Score  ?Temp 100.3 ?F (37.9 ?C)  ?BP 123/82  ?Pulse Rate 94  ?ECG Heart Rate 93  ?Resp (!) 28  ?SpO2 97 %  ?Assess: MEWS Score  ?MEWS Temp 0  ?MEWS Systolic 0  ?MEWS Pulse 0  ?MEWS RR 2  ?MEWS LOC 0  ?MEWS Score 2  ?MEWS Score Color Yellow  ?Assess: if the MEWS score is Yellow or Red  ?Were vital signs taken at a resting state? Yes  ?Focused Assessment No change from prior assessment  ?Early Detection of Sepsis Score *See Row Information* Low  ?MEWS guidelines implemented *See Row Information* No, previously yellow, continue vital signs every 4 hours  ?Treat  ?MEWS Interventions Administered scheduled meds/treatments  ?Take Vital Signs  ?Increase Vital Sign Frequency  Yellow: Q 2hr X 2 then Q 4hr X 2, if remains yellow, continue Q 4hrs  ?Escalate  ?MEWS: Escalate Yellow: discuss with charge nurse/RN and consider discussing with provider and RRT  ?Notify: Charge Nurse/RN  ?Name of Charge Nurse/RN Notified Zebedee Iba, RN  ?Date Charge Nurse/RN Notified 05/18/21  ?Time Charge Nurse/RN Notified 0820  ?Document  ?Progress note created (see row info) Yes  ? ? ?

## 2021-05-18 NOTE — Progress Notes (Signed)
?   05/18/21 1048  ?Assess: MEWS Score  ?Temp 99 ?F (37.2 ?C)  ?BP 115/79  ?Pulse Rate 92  ?ECG Heart Rate 92  ?Resp (!) 21  ?SpO2 93 %  ?Assess: MEWS Score  ?MEWS Temp 0  ?MEWS Systolic 0  ?MEWS Pulse 0  ?MEWS RR 1  ?MEWS LOC 3  ?MEWS Score 4  ?MEWS Score Color Red  ?Assess: if the MEWS score is Yellow or Red  ?Were vital signs taken at a resting state? Yes  ?Focused Assessment No change from prior assessment  ?Early Detection of Sepsis Score *See Row Information* Low  ?MEWS guidelines implemented *See Row Information* No, previously yellow, continue vital signs every 4 hours  ?Treat  ?MEWS Interventions Administered scheduled meds/treatments  ?Take Vital Signs  ?Increase Vital Sign Frequency  Red: Q 1hr X 4 then Q 4hr X 4, if remains red, continue Q 4hrs ?(respirations 21)  ?Notify: Charge Nurse/RN  ?Name of Charge Nurse/RN Notified Billy Marvel, RN  ?Date Charge Nurse/RN Notified 05/18/21  ?Time Charge Nurse/RN Notified 1048  ?Document  ?Progress note created (see row info) Yes  ? ? ?

## 2021-05-18 NOTE — Progress Notes (Signed)
Left [place] booklet at pt bedside for additional reference. ?

## 2021-05-19 LAB — BASIC METABOLIC PANEL
Anion gap: 7 (ref 5–15)
BUN: 20 mg/dL (ref 6–20)
CO2: 29 mmol/L (ref 22–32)
Calcium: 8.4 mg/dL — ABNORMAL LOW (ref 8.9–10.3)
Chloride: 99 mmol/L (ref 98–111)
Creatinine, Ser: 0.65 mg/dL (ref 0.61–1.24)
GFR, Estimated: >60 mL/min (ref >60)
Glucose, Bld: 116 mg/dL — ABNORMAL HIGH (ref 70–99)
Potassium: 4.6 mmol/L (ref 3.5–5.1)
Sodium: 135 mmol/L (ref 135–145)

## 2021-05-19 LAB — MAGNESIUM: Magnesium: 1.9 mg/dL (ref 1.7–2.4)

## 2021-05-19 NOTE — Progress Notes (Signed)
?   05/19/21 1140  ?Assess: MEWS Score  ?Temp 99.5 ?F (37.5 ?C)  ?BP (!) 131/94  ?Pulse Rate 93  ?Resp (!) 25  ?SpO2 99 %  ?O2 Device Tracheostomy Collar  ?Assess: MEWS Score  ?MEWS Temp 0  ?MEWS Systolic 0  ?MEWS Pulse 0  ?MEWS RR 1  ?MEWS LOC 1  ?MEWS Score 2  ?MEWS Score Color Yellow  ?Assess: if the MEWS score is Yellow or Red  ?Were vital signs taken at a resting state? Yes  ?Focused Assessment No change from prior assessment  ?Early Detection of Sepsis Score *See Row Information* Low  ?MEWS guidelines implemented *See Row Information* No, previously yellow, continue vital signs every 4 hours  ?Treat  ?MEWS Interventions Administered scheduled meds/treatments  ?Notify: Charge Nurse/RN  ?Name of Charge Nurse/RN Notified [place],RN  ?Date Charge Nurse/RN Notified 05/19/21  ?Time Charge Nurse/RN Notified 1140  ?Document  ?Progress note created (see row info) Yes  ? ? ?

## 2021-05-19 NOTE — Progress Notes (Signed)
?   05/19/21 1528  ?Assess: MEWS Score  ?Temp 98.1 ?F (36.7 ?C)  ?BP 121/78  ?Pulse Rate 93  ?ECG Heart Rate 93  ?Resp (!) 29  ?SpO2 98 %  ?Assess: MEWS Score  ?MEWS Temp 0  ?MEWS Systolic 0  ?MEWS Pulse 0  ?MEWS RR 2  ?MEWS LOC 3  ?MEWS Score 5  ?MEWS Score Color Red  ?Assess: if the MEWS score is Yellow or Red  ?Were vital signs taken at a resting state? Yes  ?Focused Assessment No change from prior assessment  ?Early Detection of Sepsis Score *See Row Information* Low  ?MEWS guidelines implemented *See Row Information* Yes  ?Treat  ?MEWS Interventions Other (Comment) ?(Respirations 29, no distress observed.)  ?Take Vital Signs  ?Increase Vital Sign Frequency  Red: Q 1hr X 4 then Q 4hr X 4, if remains red, continue Q 4hrs ?(MEW fluctuates between yellow and red.)  ?Notify: Charge Nurse/RN  ?Name of Charge Nurse/RN Notified [place],RN  ?Date Charge Nurse/RN Notified 05/19/21  ?Time Charge Nurse/RN Notified 1528  ?Document  ?Progress note created (see row info) Yes  ? ? ?

## 2021-05-19 NOTE — Progress Notes (Signed)
Patient seen today by trach team for consult. No education needed at this time.  All the necessary equipment is at bedside.  Will continue to follow for progression. ?

## 2021-05-19 NOTE — Progress Notes (Signed)
?PROGRESS NOTE ? ? ? ?Billy Cherry  ZOX:[id number] DOB: Oct 19, 1970 DOA: 03/30/2021 ?PCP: Patient, No Pcp Per (Inactive) ? ? ?Brief Narrative:  ?51 years old male with past medical history of hypertension was brought into the hospital after being found unresponsive by his wife.  EMS was called in and patient was noted to be in ventricular fibrillation with agonal respirations.  Patient was given CPR, epinephrine and had return of spontaneous circulation.  EKG showed anterolateral ST elevation and was taken to Cath Lab. Dr Josefa Half performed cath> insignificant CAD and EF 45-50%. Patient remained on ventilator after cath and was admitted to the ICU. Underwent TTM.  Neurology was consulted due to rhythmic twitching of his lips tongue and was started on Keppra.  MRI of the brain showed evidence of hypoxia with bilateral ACA MCA watershed infarcts.  2D echocardiogram showed LV ejection fraction of 20% with global hypokinesis.  Patient was also treated for ventilator associated pneumonia.  Currently status post tracheostomy and PEG tube placement.  Patient did have a spike of fever on 2/28 thought to be secondary to aspiration and was on Fortaz since respiratory culture was growing Pseudomonas.  Blood culture however grew out MRSE in 3 out of 3 bottles.  Infectious disease was consulted ,repeat blood cultures were sent and patient is being observed off antibiotic.  Patient persisted to have fever during hospitalization so infectious disease was reconsulted.  Patient underwent ultrasound of the lower extremities which showed DVT on the left lower extremity.  Eliquis was started 05/15/2021.   ? ?Assessment & Plan: ?  ?Cardiac arrest [facility]) ?Postarrest anoxic encephalopathy ?Postarrest respiratory failure s/p TC ?S/P PEG tube placement ?Postarrest myoclonus: ?-2D echocardiogram showed reduced LV function at 20%.   ?-Now status post trach and PEG.   ?-Remains obtunded with anoxic encephalopathy-on Keppra, for myoclonus and can  increase Klonopin for breakthrough myoclonus.   ?-At this time, plan is home with home health-Home health/patient's wife and family will be taking turns to take care of him he will need around-the-clock care. Enteral tube feeding trach kit and supplies to be  arranged. ?  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  Patient is not responsive and has shown no improvement. ?  ?Acute respiratory failure ([place]) ?-Status post trach collar.  Continue supportive care ?  ?RLL pneumonia With Sepsis ?- Previously had completed vancomycin for MSSA and [name].  Received more than 7-day course of IV Fortaz. ?  ?Low-grade fever. ?-Remained afebrile overnight.  No leukocytosis.  Continue respiratory care. Patient is at a very high risk of recurrent pneumonia.    ?-Blood cultures from 05/09/2021 negative.  Staph epidermidis in the previous culture which was thought to be contaminant.  Seen by  with Infectious disasese due to persistent fever.  No recommendation for antibiotic and further work-up.   ?-Lower extremity ultrasound showed indeterminate DVT.  Could explain some fever. ?  ?DVT (deep venous thrombosis) ([place]) ?-Duplex ultrasound of lower extremity on 05/15/2021 showed age indeterminate deep vein thrombosis involving the left femoral vein, left proximal profunda vein, left posterior tibial veins, left popliteal vein, and left peroneal veins.   ?-Due to persistent fever Eliquis has been initiated. ?  ?Dysphagia ?-Continue PEG tube feeding.  Tolerating. ?  ?Obesity, Class I, BMI 30-34.9 ?-BMI 33. ?  ?HTN (hypertension) ?-Continue Coreg, Isordil, amlodipine, Cardura, hydralazine and PRN Labetalol. Dose of hydralazine to 100 mg 3 times daily from 05/13/21.  Blood pressure much improved. ?  ?HFrEF (heart failure with reduced  ejection fraction) ([name]) ?-2D echocardiogram-LVEF less than 20%, continue coreg, imdur, amlodipine, doxazosin, hydralazine, labetalol as needed, patient is negative balance for  2918 mL. ?  ?Hypernatremia ?-Continue free water 400 cc Q 6 hrs for now sodium level has normalized at this time. ? ?Nutrition through the PEG tube.  Continue water flushes. ? ?DVT prophylaxis: Eliquis ?Code Status: Full code ?Family Communication:  None present at bedside.  ?Disposition Plan: Home with home health services ? ?Consultants:  ?PCCM ?Neurology ?Infectious disease ?Palliative care ?GI ?Cardiology ?ENT ? ?Procedures:  ?IR guided gastrostomy tube placement on 04/26/2021 ?Endotracheal tube placement mechanical ventilation. ?Tracheostomy  ?  ? ?Antimicrobials:  ?Tressie Ellis 05/02/2021>3/8 ?Vancomycin 05/08/21>05/09/21 ? ?Status is: Inpatient ? ? ? ? ?Subjective: ?Seen and examined.  Resting comfortably on the bed.  Nonverbal, not following commands. ? ?Objective: ?Vitals:  ? 05/19/21 1000 05/19/21 1140 05/19/21 1209 05/19/21 1321  ?BP: 125/77 (!) 131/94  (!) 131/91  ?Pulse:  93 93   ?Resp:  (!) 25 (!) 23   ?Temp:  99.5 ?F (37.5 ?C)    ?TempSrc:  Oral    ?SpO2:  99% 98%   ?Weight:      ?Height:      ? ? ?Intake/Output Summary (Last 24 hours) at 05/19/2021 1348 ?Last data filed at 05/19/2021 1057 ?Gross per 24 hour  ?Intake --  ?Output 3250 ml  ?Net -3250 ml  ? ?Filed Weights  ? 05/17/21 0214 05/18/21 0400 05/19/21 0400  ?Weight: 98 kg 98 kg 98.7 kg  ? ? ?Examination: ? ?General exam: Appears calm and comfortable, has track, appears weak and sick and ill ?Respiratory system: Decreased breath sound on bases.  ?Cardiovascular system: S1 & S2 heard, RRR. No JVD, murmurs, rubs, gallops or clicks. No pedal edema. ?Gastrointestinal system: Abdomen is nondistended, soft and nontender. No organomegaly or masses felt. Normal bowel sounds heard. ?Central nervous system: Nonverbal, not following commands ?Skin: No rashes, lesions or ulcers ? ? ?Data Reviewed: I have personally reviewed following labs and imaging studies ? ?CBC: ?Recent Labs  ?Lab 05/14/21 ?0336 05/17/21 ?0407  ?WBC 10.3 9.8  ?HGB 11.4* 10.8*  ?HCT 34.1* 32.4*  ?MCV  95.0 95.3  ?PLT 231 275  ? ?Basic Metabolic Panel: ?Recent Labs  ?Lab 05/14/21 ?0336 05/19/21 ?0405  ?NA 135 135  ?K 4.4 4.6  ?CL 98 99  ?CO2 31 29  ?GLUCOSE 98 116*  ?BUN 21* 20  ?CREATININE 0.67 0.65  ?CALCIUM 8.2* 8.4*  ?MG 2.0 1.9  ? ?GFR: ?Estimated Creatinine Clearance: 130.2 mL/min (by C-G formula based on SCr of 0.65 mg/dL). ?Liver Function Tests: ?No results for input(s): AST, ALT, ALKPHOS, BILITOT, PROT, ALBUMIN in the last 168 hours. ?No results for input(s): LIPASE, AMYLASE in the last 168 hours. ?No results for input(s): AMMONIA in the last 168 hours. ?Coagulation Profile: ?No results for input(s): INR, PROTIME in the last 168 hours. ?Cardiac Enzymes: ?No results for input(s): CKTOTAL, CKMB, CKMBINDEX, TROPONINI in the last 168 hours. ?BNP (last 3 results) ?No results for input(s): PROBNP in the last 8760 hours. ?HbA1C: ?No results for input(s): HGBA1C in the last 72 hours. ?CBG: ?No results for input(s): GLUCAP in the last 168 hours. ?Lipid Profile: ?No results for input(s): CHOL, HDL, LDLCALC, TRIG, CHOLHDL, LDLDIRECT in the last 72 hours. ?Thyroid Function Tests: ?No results for input(s): TSH, T4TOTAL, FREET4, T3FREE, THYROIDAB in the last 72 hours. ?Anemia Panel: ?No results for input(s): VITAMINB12, FOLATE, FERRITIN, TIBC, IRON, RETICCTPCT in the last 72 hours. ?Sepsis Labs: ?No  results for input(s): PROCALCITON, LATICACIDVEN in the last 168 hours. ? ?No results found for this or any previous visit (from the past 240 hour(s)).  ? ? ?Radiology Studies: ?No results found. ? ?Scheduled Meds: ? amLODipine  10 mg Per Tube Daily  ? apixaban  10 mg Oral BID  ? Followed by  ? [START ON 05/22/2021] apixaban  5 mg Oral BID  ? atropine  2 drop Sublingual QID  ? carvedilol  25 mg Per Tube BID  ? chlorhexidine  15 mL Mouth/Throat BID  ? clonazepam  0.5 mg Per Tube TID  ? doxazosin  2 mg Per Tube Daily  ? feeding supplement (OSMOLITE 1.5 CAL)  355 mL Per Tube QID  ? feeding supplement (PROSource TF)  45 mL Per  Tube BID  ? fiber  1 packet Per Tube BID  ? free water  400 mL Per Tube Q6H  ? Gerhardt's butt cream   Topical Daily  ? hydrALAZINE  100 mg Per Tube Q8H  ? isosorbide dinitrate  20 mg Per Tube TID  ? levE

## 2021-05-20 NOTE — Progress Notes (Signed)
?   05/20/21 1100  ?Assess: MEWS Score  ?Pulse Rate 94  ?Resp (!) 32  ?SpO2 97 %  ?O2 Device Tracheostomy Collar  ?O2 Flow Rate (L/min) 5 L/min  ?FiO2 (%) 28 %  ?Assess: MEWS Score  ?MEWS Temp 0  ?MEWS Systolic 0  ?MEWS Pulse 0  ?MEWS RR 2  ?MEWS LOC 1  ?MEWS Score 3  ?MEWS Score Color Yellow  ?Assess: if the MEWS score is Yellow or Red  ?Were vital signs taken at a resting state? Yes  ?Focused Assessment No change from prior assessment  ?MEWS guidelines implemented *See Row Information* No, previously yellow, continue vital signs every 4 hours ?(MEWS fluctuate from yellow to Red. Yellow due to RR 32)  ?Treat  ?MEWS Interventions Administered scheduled meds/treatments  ?Take Vital Signs  ?Increase Vital Sign Frequency  Yellow: Q 2hr X 2 then Q 4hr X 2, if remains yellow, continue Q 4hrs  ?Escalate  ?MEWS: Escalate Yellow: discuss with charge nurse/RN and consider discussing with provider and RRT  ?Notify: Charge Nurse/RN  ?Name of Charge Nurse/RN Notified Jasmina  ?Date Charge Nurse/RN Notified 05/20/21  ?Time Charge Nurse/RN Notified 1100  ?Document  ?Progress note created (see row info) Yes  ? ? ?

## 2021-05-20 NOTE — Progress Notes (Signed)
?PROGRESS NOTE ? ? ? ?Jeydan Barner  QTM:[id number] DOB: [birth year] DOA: 03/30/2021 ?PCP: Patient, No Pcp Per (Inactive) ? ? ?Brief Narrative:  ?51 years old male with past medical history of hypertension was brought into the hospital after being found unresponsive by his wife.  EMS was called in and patient was noted to be in ventricular fibrillation with agonal respirations.  Patient was given CPR, epinephrine and had return of spontaneous circulation.  EKG showed anterolateral ST elevation and was taken to Cath Lab. Dr Josefa Half performed cath> insignificant CAD and EF 45-50%. Patient remained on ventilator after cath and was admitted to the ICU. Underwent TTM.  Neurology was consulted due to rhythmic twitching of his lips tongue and was started on Keppra.  MRI of the brain showed evidence of hypoxia with bilateral ACA MCA watershed infarcts.  2D echocardiogram showed LV ejection fraction of 20% with global hypokinesis.  Patient was also treated for ventilator associated pneumonia.  Currently status post tracheostomy and PEG tube placement.  Patient did have a spike of fever on 2/28 thought to be secondary to aspiration and was on Fortaz since respiratory culture was growing Pseudomonas.  Blood culture however grew out MRSE in 3 out of 3 bottles.  Infectious disease was consulted ,repeat blood cultures were sent and patient is being observed off antibiotic.  Patient persisted to have fever during hospitalization so infectious disease was reconsulted.  Patient underwent ultrasound of the lower extremities which showed DVT on the left lower extremity.  Eliquis was started 05/15/2021.   ? ?Assessment & Plan: ?  ?Cardiac arrest [facility]) ?Postarrest respiratory failure s/p TC ?-2D echocardiogram showed reduced LV function at 20%.   ?-Now status post trach and PEG.   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   ?-At this time, plan is home  with home health-Home health/patient's wife and family will be taking turns to take care of him he will need around-the-clock care. Enteral tube feeding trach kit and supplies to be  arranged. ?  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  Patient is not responsive and has shown no improvement. ?  ?Acute respiratory failure ([place]) ?-Status post trach collar.  Continue supportive care ?  ?RLL pneumonia With Sepsis ?- Previously had completed vancomycin for MSSA and [name].  Received more than 7-day course of IV Fortaz. ?  ?Low-grade fever. ?-Remained afebrile overnight.  No leukocytosis.  Continue respiratory care. Patient is at a very high risk of recurrent pneumonia.    ?-Blood cultures from 05/09/2021 negative.  Staph epidermidis in the previous culture which was thought to be contaminant.  Seen by  with Infectious disasese due to persistent fever.  No recommendation for antibiotic and further work-up.   ?-Lower extremity ultrasound showed indeterminate DVT.  Could explain some fever. ?  ?DVT (deep venous thrombosis) ([place]) ?-Duplex ultrasound of lower extremity on 05/15/2021 showed age indeterminate deep vein thrombosis involving the left femoral vein, left proximal profunda vein, left posterior tibial veins, left popliteal vein, and left peroneal veins.   ?-Continue Eliquis ?  ?Dysphagia ?-Continue PEG tube feeding.  Tolerating. ?  ?Obesity, Class I, BMI 30-34.9 ?-BMI 33. ?  ?HTN (hypertension) ?-Continue Coreg, Isordil, amlodipine, Cardura, hydralazine and PRN Labetalol. ?-Continue to monitor blood pressure closely ? ?HFrEF (heart failure with reduced ejection fraction) ([name]) ?-2D echocardiogram-LVEF less than 20%, continue coreg, imdur, amlodipine, doxazosin, hydralazine, labetalol as needed ?-Strict INO's and daily weight ? ?Hypernatremia ?-Continue free  water 400 cc Q 6 hrs for now sodium level has normalized at this time. ?-Last sodium level: 135 ? ?Nutrition through the PEG  tube.  Continue water flushes. ? ?DVT prophylaxis: Eliquis ?Code Status: Full code ?Family Communication:  None present at bedside.  ?I called patient's wife however she did not answer my call ?Disposition Plan: Home with home health services ? ?Consultants:  ?PCCM ?Neurology ?Infectious disease ?Palliative care ?GI ?Cardiology ?ENT ? ?Procedures:  ?IR guided gastrostomy tube placement on 04/26/2021 ?Endotracheal tube placement mechanical ventilation. ?Tracheostomy  ?  ? ?Antimicrobials:  ?Tressie Ellis 05/02/2021>3/8 ?Vancomycin 05/08/21>05/09/21 ? ?Status is: Inpatient ? ? ? ? ?Subjective: ?Seen and examined.  RN at the bedside.  Reports that his respiratory rate goes up after trach suctioning.  No fever overnight.  No acute events overnight.  He is tolerating PEG tube feedings without any issues.  No aspiration.  Regular bowel movements. ? ?Objective: ?Vitals:  ? 05/20/21 0850 05/20/21 0901 05/20/21 1045 05/20/21 1100  ?BP:  (!) 132/97 (!) 132/94   ?Pulse: 97 96 96 94  ?Resp: (!) _0 (!) 32  ?Temp:   99.5 ?F (37.5 ?C)   ?TempSrc:   Oral   ?SpO2: 100% 100% 99% 97%  ?Weight:      ?Height:      ? ? ?Intake/Output Summary (Last 24 hours) at 05/20/2021 1327 ?Last data filed at 05/20/2021 1006 ?Gross per 24 hour  ?Intake --  ?Output 2700 ml  ?Net -2700 ml  ? ? ?Filed Weights  ? 05/18/21 0400 05/19/21 0400 05/20/21 0500  ?Weight: 98 kg 98.7 kg 98 kg  ? ? ?Examination: ? ?General exam: Appears calm and comfortable, has track, appears sick ?Respiratory system: Tracheobronchial sounds  ?cardiovascular system: S1 & S2 heard, RRR. No JVD, murmurs, rubs, gallops or clicks.  Left leg is more swollen as compared to right leg.   ?Gastrointestinal system: Abdomen is nondistended, soft and nontender. No organomegaly or masses felt. Normal bowel sounds heard. ?Central nervous system: Nonverbal, not following commands ?Skin: No rashes, lesions or ulcers ? ? ?Data Reviewed: I have personally reviewed following labs and imaging  studies ? ?CBC: ?Recent Labs  ?Lab 05/14/21 ?0336 05/17/21 ?0407  ?WBC 10.3 9.8  ?HGB 11.4* 10.8*  ?HCT 34.1* 32.4*  ?MCV 95.0 95.3  ?PLT 231 275  ? ? ?Basic Metabolic Panel: ?Recent Labs  ?Lab 05/14/21 ?0336 05/19/21 ?0405  ?NA 135 135  ?K 4.4 4.6  ?CL 98 99  ?CO2 31 29  ?GLUCOSE 98 116*  ?BUN 21* 20  ?CREATININE 0.67 0.65  ?CALCIUM 8.2* 8.4*  ?MG 2.0 1.9  ? ? ?GFR: ?Estimated Creatinine Clearance: 129.7 mL/min (by C-G formula based on SCr of 0.65 mg/dL). ?Liver Function Tests: ?No results for input(s): AST, ALT, ALKPHOS, BILITOT, PROT, ALBUMIN in the last 168 hours. ?No results for input(s): LIPASE, AMYLASE in the last 168 hours. ?No results for input(s): AMMONIA in the last 168 hours. ?Coagulation Profile: ?No results for input(s): INR, PROTIME in the last 168 hours. ?Cardiac Enzymes: ?No results for input(s): CKTOTAL, CKMB, CKMBINDEX, TROPONINI in the last 168 hours. ?BNP (last 3 results) ?No results for input(s): PROBNP in the last 8760 hours. ?HbA1C: ?No results for input(s): HGBA1C in the last 72 hours. ?CBG: ?No results for input(s): GLUCAP in the last 168 hours. ?Lipid Profile: ?No results for input(s): CHOL, HDL, LDLCALC, TRIG, CHOLHDL, LDLDIRECT in the last 72 hours. ?Thyroid Function Tests: ?No results for input(s): TSH, T4TOTAL, FREET4, T3FREE, THYROIDAB in the  last 72 hours. ?Anemia Panel: ?No results for input(s): VITAMINB12, FOLATE, FERRITIN, TIBC, IRON, RETICCTPCT in the last 72 hours. ?Sepsis Labs: ?No results for input(s): PROCALCITON, LATICACIDVEN in the last 168 hours. ? ?No results found for this or any previous visit (from the past 240 hour(s)).  ? ? ?Radiology Studies: ?No results found. ? ?Scheduled Meds: ? amLODipine  10 mg Per Tube Daily  ? apixaban  10 mg Oral BID  ? Followed by  ? [START ON 05/22/2021] apixaban  5 mg Oral BID  ? atropine  2 drop Sublingual QID  ? carvedilol  25 mg Per Tube BID  ? chlorhexidine  15 mL Mouth/Throat BID  ? clonazepam  0.5 mg Per Tube TID  ? doxazosin  2 mg  Per Tube Daily  ? feeding supplement (OSMOLITE 1.5 CAL)  355 mL Per Tube QID  ? feeding supplement (PROSource TF)  45 mL Per Tube BID  ? fiber  1 packet Per Tube BID  ? free water  400 mL Per Tube Q6H  ? Gerhardt's butt cre

## 2021-05-20 NOTE — Progress Notes (Signed)
?   05/20/21 2200  ?Assess: MEWS Score  ?Resp (!) 24  ?SpO2 96 %  ?O2 Device Tracheostomy Collar  ?O2 Flow Rate (L/min) 5 L/min  ?FiO2 (%) 28 %  ?Assess: MEWS Score  ?MEWS Temp 0  ?MEWS Systolic 0  ?MEWS Pulse 1  ?MEWS RR 1  ?MEWS LOC 1  ?MEWS Score 3  ?MEWS Score Color Yellow  ?Assess: if the MEWS score is Yellow or Red  ?Were vital signs taken at a resting state? Yes  ?Focused Assessment No change from prior assessment  ?Early Detection of Sepsis Score *See Row Information* Low  ?MEWS guidelines implemented *See Row Information* No, previously yellow, continue vital signs every 4 hours  ?Document  ?Patient Outcome Stabilized after interventions  ?Progress note created (see row info) Yes  ? ? ?

## 2021-05-21 LAB — BASIC METABOLIC PANEL
Anion gap: 8 (ref 5–15)
BUN: 27 mg/dL — ABNORMAL HIGH (ref 6–20)
CO2: 29 mmol/L (ref 22–32)
Calcium: 8.2 mg/dL — ABNORMAL LOW (ref 8.9–10.3)
Chloride: 97 mmol/L — ABNORMAL LOW (ref 98–111)
Creatinine, Ser: 0.79 mg/dL (ref 0.61–1.24)
GFR, Estimated: >60 mL/min (ref >60)
Glucose, Bld: 132 mg/dL — ABNORMAL HIGH (ref 70–99)
Potassium: 4.8 mmol/L (ref 3.5–5.1)
Sodium: 134 mmol/L — ABNORMAL LOW (ref 135–145)

## 2021-05-21 LAB — CBC
HCT: 35.4 % — ABNORMAL LOW (ref 39.0–52.0)
Hemoglobin: 11.3 g/dL — ABNORMAL LOW (ref 13.0–17.0)
MCH: 31 pg (ref 26.0–34.0)
MCHC: 31.9 g/dL (ref 30.0–36.0)
MCV: 97 fL (ref 80.0–100.0)
Platelets: 292 10*3/uL (ref 150–400)
RBC: 3.65 MIL/uL — ABNORMAL LOW (ref 4.22–5.81)
RDW: 15.9 % — ABNORMAL HIGH (ref 11.5–15.5)
WBC: 13.4 10*3/uL — ABNORMAL HIGH (ref 4.0–10.5)
nRBC: 0 % (ref 0.0–0.2)

## 2021-05-21 LAB — MAGNESIUM: Magnesium: 2 mg/dL (ref 1.7–2.4)

## 2021-05-21 NOTE — Progress Notes (Signed)
?PROGRESS NOTE ? ? ? ?Billy Cherry  ZOX:[id number] DOB: January 04, 1971 DOA: 03/30/2021 ?PCP: Patient, No Pcp Per (Inactive) ? ? ?Brief Narrative:  ?51 years old male with past medical history of hypertension was brought into the hospital after being found unresponsive by his wife.  EMS was called in and patient was noted to be in ventricular fibrillation with agonal respirations.  Patient was given CPR, epinephrine and had return of spontaneous circulation.  EKG showed anterolateral ST elevation and was taken to Cath Lab. Dr Josefa Half performed cath> insignificant CAD and EF 45-50%. Patient remained on ventilator after cath and was admitted to the ICU. Underwent TTM.  Neurology was consulted due to rhythmic twitching of his lips tongue and was started on Keppra.  MRI of the brain showed evidence of hypoxia with bilateral ACA MCA watershed infarcts.  2D echocardiogram showed LV ejection fraction of 20% with global hypokinesis.  Patient was also treated for ventilator associated pneumonia.  Currently status post tracheostomy and PEG tube placement.  Patient did have a spike of fever on 2/28 thought to be secondary to aspiration and was on Fortaz since respiratory culture was growing Pseudomonas.  Blood culture however grew out MRSE in 3 out of 3 bottles.  Infectious disease was consulted ,repeat blood cultures were sent and patient is being observed off antibiotic.  Patient persisted to have fever during hospitalization so infectious disease was reconsulted.  Patient underwent ultrasound of the lower extremities which showed DVT on the left lower extremity.  Eliquis was started 05/15/2021.   ? ?Assessment & Plan: ?  ?Cardiac arrest [facility]) ?Postarrest respiratory failure s/p TC ?-2D echocardiogram showed reduced LV function at 20%.   ?-Now status post trach and PEG.   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   ?-At this time, plan is home  with home health-Home health/patient's wife and family will be taking turns to take care of him he will need around-the-clock care. Enteral tube feeding trach kit and supplies to be  arranged. ?  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  Patient is not responsive and has shown no improvement. ?  ?Acute respiratory failure ([place]) ?-Status post trach collar.  Continue supportive care ?  ?RLL pneumonia With Sepsis ?- Previously had completed vancomycin for MSSA and [name].  Received more than 7-day course of IV Fortaz. ?  ?Low-grade fever. ?-Remained afebrile overnight. Leukocytosis: 13.4. ?-Continue respiratory care. Patient is at a very high risk of recurrent pneumonia.    ?-Blood cultures from 05/09/2021 negative.  Staph epidermidis in the previous culture which was thought to be contaminant.  Seen by  with Infectious disasese due to persistent fever.  No recommendation for antibiotic and further work-up.   ?  ?LLE DVT (deep venous thrombosis) ([place]) ?-Duplex ultrasound of lower extremity on 05/15/2021 showed age indeterminate deep vein thrombosis involving the left femoral vein, left proximal profunda vein, left posterior tibial veins, left popliteal vein, and left peroneal veins.   ?-Continue Eliquis ?  ?Dysphagia ?-Continue PEG tube feeding.  Tolerating. ?  ?Obesity, Class I, BMI 30-34.9 ?-BMI 33. ?  ?HTN (hypertension) ?-Continue Coreg, Isordil, amlodipine, Cardura, hydralazine and PRN Labetalol. ?-Continue to monitor blood pressure closely ? ?HFrEF (heart failure with reduced ejection fraction) ([name]) ?-2D echocardiogram-LVEF less than 20%, continue coreg, imdur, amlodipine, doxazosin, hydralazine, labetalol as needed ?-Strict INO's and daily weight ? ?Hypernatremia ?-Continue free water 400 cc Q 6 hrs for now sodium level has normalized  at this time. ?-Last sodium level: 134 ? ?Nutrition through the PEG tube.  Continue water flushes. ? ?DVT prophylaxis: Eliquis ?Code Status: Full  code ?Family Communication:  None present at bedside.  ?3/18: I called patient's wife however she did not answer my call ?3/19: Talked to patient' wife for more than 30 minutes to discuss further pain.  She tells me that she has been going through patient's insurance/bank issues etc. she knows some about trach care but nothing regarding PEG tube care yet. She needs to make sure that she has all medical supplies including hospital bed prior to the discharge from the hospital. ? ?Disposition Plan: Home with home health services ? ?Consultants:  ?PCCM ?Neurology ?Infectious disease ?Palliative care ?GI ?Cardiology ?ENT ? ?Procedures:  ?IR guided gastrostomy tube placement on 04/26/2021 ?Endotracheal tube placement mechanical ventilation. ?Tracheostomy  ?  ? ?Antimicrobials:  ?Tressie Ellis 05/02/2021>3/8 ?Vancomycin 05/08/21>05/09/21 ? ?Status is: Inpatient ? ? ? ? ?Subjective: ?Patient seen and examined.  Resting comfortably on the bed.  Nonverbal.  Remained afebrile.  No acute events overnight. ? ?Objective: ?Vitals:  ? 05/21/21 0600 05/21/21 0904 05/21/21 0933 05/21/21 1129  ?BP: 114/83 129/88  101/68  ?Pulse: 94 91 94 91  ?Resp: (!) 31  (!) 24 (!) 31  ?Temp: 99.3 ?F (37.4 ?C)   98.7 ?F (37.1 ?C)  ?TempSrc: Oral   Oral  ?SpO2: 97%  100% 99%  ?Weight: 97 kg     ?Height:      ? ? ?Intake/Output Summary (Last 24 hours) at 05/21/2021 1133 ?Last data filed at 05/21/2021 1131 ?Gross per 24 hour  ?Intake 0 ml  ?Output 1750 ml  ?Net -1750 ml  ? ? ?Filed Weights  ? 05/19/21 0400 05/20/21 0500 05/21/21 0600  ?Weight: 98.7 kg 98 kg 97 kg  ? ? ?Examination: ? ?General exam: Appears calm and comfortable, has track, appears sick ?Respiratory system: Tracheobronchial sounds  ?cardiovascular system: S1 & S2 heard, RRR. No JVD, murmurs, rubs, gallops or clicks.  Left leg is more swollen as compared to right leg.   ?Gastrointestinal system: Abdomen is nondistended, soft and nontender. No organomegaly or masses felt. Normal bowel sounds  heard. ?Central nervous system: Nonverbal, not following commands, opening his eyes briefly ? ? ? ?Data Reviewed: I have personally reviewed following labs and imaging studies ? ?CBC: ?Recent Labs  ?Lab 05/17/21 ?0407 05/21/21 ?0315  ?WBC 9.8 13.4*  ?HGB 10.8* 11.3*  ?HCT 32.4* 35.4*  ?MCV 95.3 97.0  ?PLT 275 292  ? ? ?Basic Metabolic Panel: ?Recent Labs  ?Lab 05/19/21 ?0405 05/21/21 ?0315  ?NA 135 134*  ?K 4.6 4.8  ?CL 99 97*  ?CO2 29 29  ?GLUCOSE 116* 132*  ?BUN 20 27*  ?CREATININE 0.65 0.79  ?CALCIUM 8.4* 8.2*  ?MG 1.9 2.0  ? ? ?GFR: ?Estimated Creatinine Clearance: 129.1 mL/min (by C-G formula based on SCr of 0.79 mg/dL). ?Liver Function Tests: ?No results for input(s): AST, ALT, ALKPHOS, BILITOT, PROT, ALBUMIN in the last 168 hours. ?No results for input(s): LIPASE, AMYLASE in the last 168 hours. ?No results for input(s): AMMONIA in the last 168 hours. ?Coagulation Profile: ?No results for input(s): INR, PROTIME in the last 168 hours. ?Cardiac Enzymes: ?No results for input(s): CKTOTAL, CKMB, CKMBINDEX, TROPONINI in the last 168 hours. ?BNP (last 3 results) ?No results for input(s): PROBNP in the last 8760 hours. ?HbA1C: ?No results for input(s): HGBA1C in the last 72 hours. ?CBG: ?No results for input(s): GLUCAP in the last 168 hours. ?Lipid  Profile: ?No results for input(s): CHOL, HDL, LDLCALC, TRIG, CHOLHDL, LDLDIRECT in the last 72 hours. ?Thyroid Function Tests: ?No results for input(s): TSH, T4TOTAL, FREET4, T3FREE, THYROIDAB in the last 72 hours. ?Anemia Panel: ?No results for input(s): VITAMINB12, FOLATE, FERRITIN, TIBC, IRON, RETICCTPCT in the last 72 hours. ?Sepsis Labs: ?No results for input(s): PROCALCITON, LATICACIDVEN in the last 168 hours. ? ?No results found for this or any previous visit (from the past 240 hour(s)).  ? ? ?Radiology Studies: ?No results found. ? ?Scheduled Meds: ? amLODipine  10 mg Per Tube Daily  ? apixaban  10 mg Oral BID  ? Followed by  ? [START ON 05/22/2021] apixaban  5 mg  Oral BID  ? atropine  2 drop Sublingual QID  ? carvedilol  25 mg Per Tube BID  ? chlorhexidine  15 mL Mouth/Throat BID  ? clonazepam  0.5 mg Per Tube TID  ? doxazosin  2 mg Per Tube Daily  ? feeding supplement (OSMOL

## 2021-05-21 NOTE — Progress Notes (Signed)
?   05/21/21 1220  ?Assess: MEWS Score  ?BP 123/89  ?Pulse Rate 90  ?ECG Heart Rate 91  ?Resp 18  ?SpO2 100 %  ?Assess: MEWS Score  ?MEWS Temp 0  ?MEWS Systolic 0  ?MEWS Pulse 0  ?MEWS RR 0  ?MEWS LOC 1  ?MEWS Score 1  ?MEWS Score Color Green  ?Assess: if the MEWS score is Yellow or Red  ?Were vital signs taken at a resting state? Yes  ?Focused Assessment Change from prior assessment (see assessment flowsheet)  ?Early Detection of Sepsis Score *See Row Information* Low  ?MEWS guidelines implemented *See Row Information* No, vital signs rechecked  ? ? ?

## 2021-05-21 NOTE — Progress Notes (Signed)
?   05/21/21 1600  ?Assess: MEWS Score  ?Temp 98 ?F (36.7 ?C)  ?BP 104/72  ?Pulse Rate 91  ?ECG Heart Rate 91  ?Resp (!) 29  ?SpO2 98 %  ?O2 Device Tracheostomy Collar  ?Assess: MEWS Score  ?MEWS Temp 0  ?MEWS Systolic 0  ?MEWS Pulse 0  ?MEWS RR 2  ?MEWS LOC 1  ?MEWS Score 3  ?MEWS Score Color Yellow  ?Assess: if the MEWS score is Yellow or Red  ?Were vital signs taken at a resting state? Yes  ?Focused Assessment No change from prior assessment  ?Early Detection of Sepsis Score *See Row Information* Low  ?MEWS guidelines implemented *See Row Information* No, previously yellow, continue vital signs every 4 hours  ? ? ?

## 2021-05-22 NOTE — Progress Notes (Signed)
?PROGRESS NOTE ? ? ? ?Billy Cherry  PTW:[id number] DOB: [birth year] DOA: 03/30/2021 ?PCP: Patient, No Pcp Per (Inactive)  ? ? ?Brief Narrative:  ?51 years old male with past medical history of hypertension was brought into the hospital after being found unresponsive by his wife.  EMS was called in and patient was noted to be in ventricular fibrillation with agonal respirations.  Patient was given CPR, epinephrine and had return of spontaneous circulation.  EKG showed anterolateral ST elevation and was taken to Cath Lab. Dr Josefa Half performed cath> insignificant CAD and EF 45-50%. Patient remained on ventilator after cath and was admitted to the ICU. Underwent TTM.  Neurology was consulted due to rhythmic twitching of his lips tongue and was started on Keppra.  MRI of the brain showed evidence of hypoxia with bilateral ACA MCA watershed infarcts.  2D echocardiogram showed LV ejection fraction of 20% with global hypokinesis.  Patient was also treated for ventilator associated pneumonia.  Currently status post tracheostomy and PEG tube placement.  Patient did have a spike of fever on 2/28 thought to be secondary to aspiration and was on Fortaz since respiratory culture was growing Pseudomonas.  Blood culture however grew out MRSE in 3 out of 3 bottles.  Infectious disease was consulted ,repeat blood cultures were sent and patient is being observed off antibiotic.  Patient persisted to have fever during hospitalization so infectious disease was reconsulted.  Patient underwent ultrasound of the lower extremities which showed DVT on the left lower extremity.  Eliquis was started 05/15/2021.  Patient continues to have low-grade fever while in the hospital.  ? ?  ?Assessment and Plan: ?* Cardiac arrest [facility]) ?Postarrest anoxic encephalopathy ?Postarrest respiratory failure s/p TC ?S/P PEG tube placement ?Postarrest myoclonus: ?2D echocardiogram showed reduced LV function at 20%.  Now status post trach and PEG.  Remains  obtunded with anoxic encephalopathy-on Keppra, for myoclonus and can increase Klonopin for breakthrough myoclonus.  At this time, plan is home with home health-Home health/patient's wife and family will be taking turns to take care of him he will need around-the-clock care. Enteral tube feeding trach kit and supplies to be  Arranged. PCCM planning for tracheostomy exchange today. ? 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  Patient is not responsive and has shown no improvement. ? ?Acute respiratory failure ([place]) ?Status post trach collar.  Continue supportive care ? ?RLL pneumonia ?With Sepsis Previously had completed vancomycin for MSSA and [name].  Received more than 7-day course of IV Fortaz. ? ? ?Fever ? Low-grade fever.Temperature max of 100.?F.  Continue respiratory care. Patient is at a very high risk of recurrent pneumonia.   WBC at 9.8. Blood cultures from 05/09/2021 negative.  Staph epidermidis in the previous culture which was thought to be contaminant.  Seen by  with Infectious disasese due to persistent fever.  No recommendation for antibiotic and further work-up.  Lower extremity ultrasound showed indeterminate DVT.  ? ?DVT (deep venous thrombosis) ([place]) ?Duplex ultrasound of lower extremity on 05/15/2021 showed age indeterminate deep vein thrombosis involving the left femoral vein, left proximal profunda vein, left posterior tibial veins, left popliteal vein, and left peroneal veins.  Due to persistent fever Eliquis has been initiated since yesterday. ? ?Dysphagia ?Continue PEG tube feeding.  Tolerating. ? ?Obesity, Class I, BMI 30-34.9 ?BMI 33.  Supportive care. ? ?HTN (hypertension) ?Continue Coreg, Isordil, amlodipine, Cardura, hydralazine and PRN Labetalol. Dose of hydralazine to 100 mg 3 times daily from 05/13/21.  Blood pressure much improved. ? ?  HFrEF (heart failure with reduced ejection fraction) ([place]) ?2D echocardiogram-LVEF less than 20%, continue coreg, imdur,  amlodipine, doxazosin, hydralazine, labetalol as needed, patient is negative balance for 2918 mL. ? ?Hypernatremia ?Continue free water through the flushes.  Latest sodium of 134. ? ?Myoclonus ?Seen by neurology.  Continue current AED with Klonopin, Keppra. per neuro notes, suppressing his myoclonus will not ultimately change the prognosis  ? ?Nutrition through the PEG tube.  Continue water flushes. ? ? DVT prophylaxis:  ?apixaban (ELIQUIS) tablet 5 mg  ? ?Code Status:   ?  Code Status: Full Code ? ?Disposition:  uncertain disposition.  Likely home with home health. ? ?Status is: Inpatient ? ?Remains inpatient appropriate because: Need for tracheostomy and PEG tube care, pending Medicaid, low-grade fever, pending disposition plan. ? ? Family Communication:  ?None today.   ? ?Consultants:  ?Palliative,  ?ENT,  ?GI,  ?neuro,  ?cardiology,  ?PCCM,  ?Infectious disease ? ?Procedures:  ?IR guided gastrostomy tube placement on 04/26/2021 ?Endotracheal tube placement mechanical ventilation. ?Tracheostomy  ? ?Antimicrobials:  ?Tressie Ellis 05/02/2021>3/8 ?Vancomycin 05/08/21>05/09/21 ? ?Subjective: ? ?Today, patient was seen and examined at bedside.  Continues to have low-grade fever.  Nonverbal.  No other complaints reported.  ? ?Objective: ?Vitals:  ? 05/22/21 0411 05/22/21 0436 05/22/21 0600 05/22/21 0753  ?BP: (!) 127/93 (!) 127/93    ?Pulse: 98 98  98  ?Resp: (!) 28 (!) 34  (!) 31  ?Temp: 99.7 ?F (37.6 ?C)  100 ?F (37.8 ?C)   ?TempSrc:   Oral   ?SpO2: 96% 95%  98%  ?Weight: 96 kg     ?Height:      ? ? ?Intake/Output Summary (Last 24 hours) at 05/22/2021 1127 ?Last data filed at 05/22/2021 0300 ?Gross per 24 hour  ?Intake 0 ml  ?Output 1575 ml  ?Net -1575 ml  ? ?Filed Weights  ? 05/20/21 0500 05/21/21 0600 05/22/21 0411  ?Weight: 98 kg 97 kg 96 kg  ? ?Body mass index is 30.37 kg/m?.  ? ?Physical Examination: ? ?General: Obese built, not in obvious distress, chronically ill, nonverbal, opens eyes spontaneously, no purposeful  movements ?HENT:   No scleral pallor or icterus noted. Oral mucosa is moist.  Tracheostomy in place. ?Chest:   Diminished breath sounds bilaterally. N ?CVS: S1 &S2 heard. No murmur.  Regular rate and rhythm. ?Abdomen: Soft, nontender, nondistended.  Bowel sounds are heard.  PEG tube in place. ?Extremities: No cyanosis, clubbing or edema.  Peripheral pulses are palpable. ?Psych: Opens eyes spontaneously, nonverbal ?CNS: Flaccid extremities, nonverbal, opens eyes spontaneously, no purposeful movements. ?Skin: Warm and dry.  No rashes noted. ? ?CBC: ?Recent Labs  ?Lab 05/17/21 ?0407 05/21/21 ?0315  ?WBC 9.8 13.4*  ?HGB 10.8* 11.3*  ?HCT 32.4* 35.4*  ?MCV 95.3 97.0  ?PLT 275 292  ? ? ?Basic Metabolic Panel: ?Recent Labs  ?Lab 05/19/21 ?0405 05/21/21 ?0315  ?NA 135 134*  ?K 4.6 4.8  ?CL 99 97*  ?CO2 29 29  ?GLUCOSE 116* 132*  ?BUN 20 27*  ?CREATININE 0.65 0.79  ?CALCIUM 8.4* 8.2*  ?MG 1.9 2.0  ? ? ?Liver Function Tests: ?No results for input(s): AST, ALT, ALKPHOS, BILITOT, PROT, ALBUMIN in the last 168 hours. ? ? ? ?Radiology Studies: ?No results found. ? ? ? LOS: 53 days  ? ? ?Flora Lipps, MD ?[facility] ?05/22/2021, 11:27 AM  ?  ?

## 2021-05-22 NOTE — Progress Notes (Signed)
RN taught pt's wife to clean site and administer meds thru the PEG tube.  Pt's wife was there when RT was doing trache care and suctioning and was instructed on this as well.  Pt's wife stated that she was more comfortable on being able to take care of this when pt goes home.  Will continue to monitor, Thanks Lavonda Jumbo RN.   ?

## 2021-05-22 NOTE — Progress Notes (Signed)
MD, pt's amount of free water may need to be reduced due to pt vomiting at times.  May be too much for the pt to handle.  Also the pt's Osmolite amount may be too much as well.  When pt vomits there is traces of Osmolite in her vomitus.  Please address, thanks Lavonda Jumbo RN.  ?

## 2021-05-22 NOTE — Progress Notes (Signed)
?NAME:  Clennon Nasca, MRN:  [id number], DOB:  08/26/1970, LOS: 53 ?ADMISSION DATE:  03/30/2021, CONSULTATION DATE:  1/19 ?REFERRING MD:  Parashos, CHIEF COMPLAINT:  Found down, cardiac arrest  ? ?Brief Pt Description / Synopsis:  ?51 year old male with out-of-hospital V. fib cardiac arrest in setting of respiratory arrest due to suspected angioedema from ACE inhibitor leading to asphyxiation.  Concern on his 12 lead EKG for anterolateral ST elevation so he was taken to the cath lab where his study showed no significant coronary artery disease and an LVEF < 20%  Now with anoxic brain injury. ? ?Pertinent  Medical History  ?Hypertension ? ?Significant Hospital Events: ?Including procedures, antibiotic start and stop dates in addition to other pertinent events   ?1/19 admission, left heart cath without significant CAD.  Concern for anoxic brain injury ?1/20: MRI pending, Neuro following ?1/21: MRI shows areas of hypoxic-ischemic injury bilaterally. Fevered; cultures obtained. ?1/23 severe brain damage ?1/24 evidence of severe brain damage, remains critically ill ?1/25: Plan for PEG today. Neuro exam unchanged.  Does exhibit new eyelid tremor like movements and lip/jaw tremor like vs clonus movement.  Bolused Keppra and increased maintenance dose.  Repeat EEG. CTH: concerning for progression of anoxic injury.  ?1/26: Repeat EEG unchanged and continues to show myoclonic seizures despite addition of Valproic acid yesterday. transfer to [facility] for continuous EEG ?1/28: Continues to have myoclonic sz on EEG ?1/29 No acute issues overnight ?2/3 no meaningful neuro recovery. Diuresed ?2/5 unchanged ?2/6 New fever to 102, WBC 16K - treated with vancomycin for 7d based on cultures.  ?2/11: percutaneous tracheostomy ?2/13 >2/20 on trach collar. No vent.  ?2/20 Trach change to 6 cuffless ?2/27 status unchanged ?3/13 unchanged ?3/20 due for trach exchange given continue secretions (although improved) will stick to 6 cuffless  for exchange  ? ?Interim History / Subjective:  ?No major issues, continues to have some secretions but appear decreased in volume per nurse  ? ?Objective   ?Blood pressure (!) 127/93, pulse 98, temperature 100 ?F (37.8 ?C), temperature source Oral, resp. rate (!) 31, height 5\' 10"  (1.778 m), weight 96 kg, SpO2 98 %. ?   ?FiO2 (%):  [28 %] 28 %  ? ?Intake/Output Summary (Last 24 hours) at 05/22/2021 1055 ?Last data filed at 05/22/2021 0300 ?Gross per 24 hour  ?Intake 0 ml  ?Output 1575 ml  ?Net -1575 ml  ? ? ? ?Filed Weights  ? 05/20/21 0500 05/21/21 0600 05/22/21 0411  ?Weight: 98 kg 97 kg 96 kg  ? ?Examination:  ?General: Acute on chronically ill appearing middle aged male lying in bed on ATC, in NAD ?HEENT: 6 cuffless trach midline, MM pink/moist, PERRL,  ?Neuro: Eyes spontaneously open, unable to follow commands  ?CV: s1s2 regular rate and rhythm, no murmur, rubs, or gallops,  ?PULM:  Rhonchi right greater than left, tolerating ATC ?GI: soft, bowel sounds active in all 4 quadrants, non-tender, non-distended, tolerating TF ?Extremities: warm/dry, no edema  ?Skin: no rashes or lesions ? ? ?Ancillary tests personally reviewed:    ? ?Assessment & Plan:  ?OOH Vfib arrest ?Post arrest anoxic brain injury, severe ?Post arrest myoclonus improved with klonopin, no AEDs recommended ?Post arrest respiratory failure with prolonged mechanical ventilation s/p tracheostomy ?Post arrest cardiomyopathy ?Post arrest AKI, improved/stable ?HTN- stable on current regimen ?HCAP w/ MSSA- ABX > 2/21 ?Likely GPC bacteremia ? ? ?Plan:  ?Continue routine trach care ?Due for trach exchange today given continued secretions will exchange for same size, 6 cuffless  ?  SLP was following for effort for PMV trials but patient remains non-verbal and team has no signed off  ? ? ?PCCM will continue to follow weekly for trach care, please call if needed.  ? ?PCCM will follow weekly for trach management. Please call if questions arise in the  interim. ? ?Best Practice (right click and "Reselect all SmartList Selections" daily)  ?Per trh ? ? ?Jerre Diguglielmo D. Tiburcio Pea, NP ?[facility] ?See Amion for contact info ?05/22/2021 10:55 AM ? ? ? ? ? ? ? ? ? ? ? ? ? ? ? ? ? ? ?

## 2021-05-23 MED ORDER — FREE WATER
200.0000 mL | Status: DC
  Administered 2021-05-23 – 2021-06-15 (×131): 200 mL

## 2021-05-23 NOTE — Progress Notes (Signed)
Nutrition Follow-up ? ?DOCUMENTATION CODES:  ? ?Not applicable ? ?INTERVENTION:  ? ?Continue bolus Tube Feeds via PEG: ?Osmolite 1.5 355 mL - QID (1 1/2 cartons/bolus)  ?45 mL ProSource TF - BID ?Adjust free water 200 mL free water q4h  ?Provides 2210 kcal, 111 gm PRO, and 1086 mL free water (2286 mL total free water) daily.  ? ?NUTRITION DIAGNOSIS:  ? ?Inadequate oral intake related to inability to eat as evidenced by NPO status. - Ongoing, being addressed via TF ? ?GOAL:  ? ?Patient will meet greater than or equal to 90% of their needs - Being addressed via TF ? ?MONITOR:  ? ?Labs, Weight trends, TF tolerance, Skin ? ?REASON FOR ASSESSMENT:  ? ?Ventilator, Consult ?Enteral/tube feeding initiation and management ? ?ASSESSMENT:  ? ?51 year old male who originally presented to [facility] on 1/19 after out-of-hospital cardiac arrest due to suspected angioedema from ACE inhibitor leading to asphyxiation. PMH of HTN, HLD. ? ?2/11 - tracheostomy placed ?2/22 - PEG placed  ? ?Spoke with RN in room. RN reports that she has been with the pt for the past two day. Has not noticed any signs of intolerance; no vomiting. Reports that he had a normal bowel movement this morning. RN reports that pt is still having secretions.  ?RN with no other questions or concerns at this time.  ? ?Spoke with MD about adjusting free water flushes due to sodium level dropped to 134. MD ok.  ? ?Medications reviewed and include: Fiber, Protonix ?Labs reviewed. ? ?Diet Order:   ? ? None  ? ?EDUCATION NEEDS:  ? ?Not appropriate for education at this time ? ?Skin:  Skin Assessment: Skin Integrity Issues: ?Skin Integrity Issues:: Other (Comment) ?Other: MASD buttocks ? ?Last BM:  3/21 ? ?Height:  ? ?Ht Readings from Last 1 Encounters:  ?04/18/21 5\' 10"  (1.778 m)  ? ? ?Weight:  ? ?Wt Readings from Last 1 Encounters:  ?05/23/21 97 kg  ? ? ?Ideal Body Weight:  75.5 kg ? ?BMI:  Body mass index is 30.68 kg/m?. ? ?Estimated Nutritional Needs:  ? ?Kcal:   2200-2400 ? ?Protein:  110-125 grams ? ?Fluid:  >/= 2.2 L ? ? ?05/25/21 RD, LDN ?Clinical Dietitian ?See AMiON for contact information.  ? ?

## 2021-05-23 NOTE — TOC Progression Note (Signed)
Transition of Care (TOC) - Progression Note  ? ? ?Patient Details  ?Name: Billy Cherry ?MRN: [id number] ?Date of Birth: February 23, 1971 ? ?Transition of Care (TOC) CM/SW Contact  ?Leone Haven, RN ?Phone Number: ?05/23/2021, 7:05 PM ? ?Clinical Narrative:    ?NCM received call from Financial counselor Meriam Sprague today, she states she believes that it will be one more week before Medicaid is approved, she has been working with his Medicaid Case Worker.   Also Staff RN's are to document when teaching is being done with the wife.  TOC will continue to follow.  ? ? ?Expected Discharge Plan: Home w Home Health Services ?Barriers to Discharge: Barriers Resolved ? ?Expected Discharge Plan and Services ?Expected Discharge Plan: Home w Home Health Services ?In-house Referral: Hospice / Palliative Care ?Discharge Planning Services: CM Consult ?Post Acute Care Choice: Long Term Acute Care (LTAC) ?Living arrangements for the past 2 months: Single Family Home ?                ?DME Arranged: N/A ?DME Agency: [employer] ?  ?  ?  ?HH Arranged: Nurse's Aide ?HH Agency: [facility] ?  ?  ?Representative spoke with at [facility] Agency: [name] ? ? ?Social Determinants of Health (SDOH) Interventions ?  ? ?Readmission Risk Interventions ?Readmission Risk Prevention Plan 04/14/2021  ?Transportation Screening Complete  ?HRI or Home Care Consult Complete  ?Social Work Consult for Recovery Care Planning/Counseling Complete  ?Palliative Care Screening Complete  ?Medication Review Oceanographer) Complete  ?Some recent data might be hidden  ? ? ?

## 2021-05-23 NOTE — Progress Notes (Signed)
?PROGRESS NOTE ? ? ? ?Adrienne Delay  GHW:[id number] DOB: 1970-05-02 DOA: 03/30/2021 ?PCP: Patient, No Pcp Per (Inactive)  ? ? ?Brief Narrative:  ?51 years old male with past medical history of hypertension was brought into the hospital after being found unresponsive by his wife.  EMS was called in and patient was noted to be in ventricular fibrillation with agonal respirations.  Patient was given CPR, epinephrine and had return of spontaneous circulation.  EKG showed anterolateral ST elevation and was taken to Cath Lab. Dr Josefa Half performed cath> insignificant CAD and EF 45-50%. Patient remained on ventilator after cath and was admitted to the ICU. Underwent TTM.  Neurology was consulted due to rhythmic twitching of his lips tongue and was started on Keppra.  MRI of the brain showed evidence of hypoxia with bilateral ACA MCA watershed infarcts.  2D echocardiogram showed LV ejection fraction of 20% with global hypokinesis.  Patient was also treated for ventilator associated pneumonia.  Currently status post tracheostomy and PEG tube placement.  Patient did have a spike of fever on 2/28 thought to be secondary to aspiration and was on Fortaz since respiratory culture was growing Pseudomonas.  Blood culture however grew out MRSE in 3 out of 3 bottles.  Infectious disease was consulted ,repeat blood cultures were sent and patient is being observed off antibiotic.  Patient persisted to have fever during hospitalization so infectious disease was reconsulted.  Patient underwent ultrasound of the lower extremities which showed DVT on the left lower extremity.  Eliquis was started 05/15/2021.  Patient continues to have low-grade fever while in the hospital.  Pulmonary following the patient for tracheostomy management  ? ?  ?Assessment and Plan: ?* Cardiac arrest [facility]) ?Postarrest anoxic encephalopathy ?Postarrest respiratory failure s/p TC ?S/P PEG tube placement ?Postarrest myoclonus: ?2D echocardiogram showed reduced LV  function at 20%.  Now status post trach and PEG.  Remains obtunded with anoxic encephalopathy-on Keppra, for myoclonus and can increase Klonopin for breakthrough myoclonus.  At this time, plan is home with home health-Home health/patient's wife and family will be taking turns to take care of him he will need around-the-clock care. Enteral tube feeding trach kit and supplies to be  Arranged. PCCM managing tracheostomy. ? ?Anoxic encephalopathy ([place]) ?Continue supportive care.  Patient is not responsive and has shown no improvement.  No interval changes ? ?Acute respiratory failure ([place]) ?Status post trach collar.  Continue supportive care ? ?RLL pneumonia ?With Sepsis Previously had completed vancomycin for MSSA and [name].  Received more than 7-day course of IV Fortaz. ? ? ?Fever ? Low-grade fever.Temperature max of 100.?F.  Continue respiratory care. Patient is at a very high risk of recurrent pneumonia.   WBC at 9.8. Blood cultures from 05/09/2021 negative.  Staph epidermidis in the previous culture which was thought to be contaminant.  Seen by  with Infectious disasese due to persistent fever.  No recommendation for antibiotic and further work-up.  Lower extremity ultrasound showed indeterminate DVT.  ? ?DVT (deep venous thrombosis) ([place]) ?Duplex ultrasound of lower extremity on 05/15/2021 showed age indeterminate deep vein thrombosis involving the left femoral vein, left proximal profunda vein, left posterior tibial veins, left popliteal vein, and left peroneal veins.  Due to persistent fever Eliquis has been initiated since yesterday. ? ?Dysphagia ?Continue PEG tube feeding.  Tolerating. ? ?Obesity, Class I, BMI 30-34.9 ?BMI 33.  Supportive care. ? ?HTN (hypertension) ?Continue Coreg, Isordil, amlodipine, Cardura, hydralazine and PRN Labetalol. Dose of hydralazine to 100 mg 3  times daily from 05/13/21.  Blood pressure much improved. ? ?HFrEF (heart failure with reduced ejection fraction)  ([place]) ?2D echocardiogram-LVEF less than 20%, continue coreg, imdur, amlodipine, doxazosin, hydralazine, labetalol as needed, patient is negative balance for 2918 mL. ? ?Hypernatremia ?Continue free water through the flushes.  Latest sodium of 134. ? ?Myoclonus ?Seen by neurology.  Continue current AED with Klonopin, Keppra. per neuro notes, suppressing his myoclonus will not ultimately change the prognosis  ? ?Nutrition through the PEG tube.  Continue water flushes. ? ? DVT prophylaxis:  ?apixaban (ELIQUIS) tablet 5 mg  ? ?Code Status:   ?  Code Status: Full Code ? ?Disposition:  uncertain disposition.  Likely home with home health. ? ?Status is: Inpatient ? ?Remains inpatient appropriate because: Need for tracheostomy and PEG tube care, pending Medicaid, low-grade fever, pending disposition plan. ? ? Family Communication:  ?None today.   ? ?Consultants:  ?Palliative,  ?ENT,  ?GI,  ?neuro,  ?cardiology,  ?PCCM,  ?Infectious disease ? ?Procedures:  ?IR guided gastrostomy tube placement on 04/26/2021 ?Endotracheal tube placement mechanical ventilation. ?Tracheostomy  ? ?Antimicrobials:  ?Tressie Ellis 05/02/2021>3/8 ?Vancomycin 05/08/21>05/09/21 ? ?Subjective: ? ?Today, patient was seen and examined at bedside.  No interval changes reported.  No improvement clinically.   ? ?Objective: ?Vitals:  ? 05/23/21 0731 05/23/21 0801 05/23/21 1048 05/23/21 1113  ?BP: 130/85   123/76  ?Pulse: 93 94 85 96  ?Resp: (!) 26 (!) 25 (!) 25 (!) 26  ?Temp: 99.7 ?F (37.6 ?C)   99.8 ?F (37.7 ?C)  ?TempSrc: Oral   Axillary  ?SpO2: 94% 98% 95% 98%  ?Weight:      ?Height:      ? ? ?Intake/Output Summary (Last 24 hours) at 05/23/2021 1116 ?Last data filed at 05/23/2021 0159 ?Gross per 24 hour  ?Intake --  ?Output 2000 ml  ?Net -2000 ml  ? ?Filed Weights  ? 05/21/21 0600 05/22/21 0411 05/23/21 0436  ?Weight: 97 kg 96 kg 97 kg  ? ?Body mass index is 30.68 kg/m?.  ? ?Physical Examination: ? ?General: Obese built, not in obvious distress, chronically ill,  nonverbal, opens eyes spontaneously, no purposeful movements ?HENT:   No scleral pallor or icterus noted. Oral mucosa is moist.  Trach collar in place. ?Chest:   Diminished breath sounds bilaterally.  ?CVS: S1 &S2 heard. No murmur.  Regular rate and rhythm. ?Abdomen: Soft, nontender, nondistended.  Bowel sounds are heard.  Tube in place without erythema induration. ?Extremities: No cyanosis, clubbing or edema.  Peripheral pulses are palpable. ?Psych: Opens eyes spontaneously, unresponsive, nonverbal ?CNS: Flaccid extremities, nonverbal, no purposeful movements. ?Skin: Warm and dry.   ? ? ?CBC: ?Recent Labs  ?Lab 05/17/21 ?0407 05/21/21 ?0315  ?WBC 9.8 13.4*  ?HGB 10.8* 11.3*  ?HCT 32.4* 35.4*  ?MCV 95.3 97.0  ?PLT 275 292  ? ? ?Basic Metabolic Panel: ?Recent Labs  ?Lab 05/19/21 ?0405 05/21/21 ?0315  ?NA 135 134*  ?K 4.6 4.8  ?CL 99 97*  ?CO2 29 29  ?GLUCOSE 116* 132*  ?BUN 20 27*  ?CREATININE 0.65 0.79  ?CALCIUM 8.4* 8.2*  ?MG 1.9 2.0  ? ? ?Liver Function Tests: ?No results for input(s): AST, ALT, ALKPHOS, BILITOT, PROT, ALBUMIN in the last 168 hours. ? ? ? ?Radiology Studies: ?No results found. ? ? ? LOS: 54 days  ? ? ?Flora Lipps, MD ?[facility] ?05/23/2021, 11:16 AM  ?  ?

## 2021-05-23 NOTE — Progress Notes (Signed)
No Visitors during this shift.  ?

## 2021-05-24 LAB — CBC
HCT: 33.3 % — ABNORMAL LOW (ref 39.0–52.0)
Hemoglobin: 10.4 g/dL — ABNORMAL LOW (ref 13.0–17.0)
MCH: 30.1 pg (ref 26.0–34.0)
MCHC: 31.2 g/dL (ref 30.0–36.0)
MCV: 96.5 fL (ref 80.0–100.0)
Platelets: 278 10*3/uL (ref 150–400)
RBC: 3.45 MIL/uL — ABNORMAL LOW (ref 4.22–5.81)
RDW: 15.3 % (ref 11.5–15.5)
WBC: 8.5 10*3/uL (ref 4.0–10.5)
nRBC: 0 % (ref 0.0–0.2)

## 2021-05-24 LAB — BASIC METABOLIC PANEL
Anion gap: 9 (ref 5–15)
BUN: 27 mg/dL — ABNORMAL HIGH (ref 6–20)
CO2: 29 mmol/L (ref 22–32)
Calcium: 8.5 mg/dL — ABNORMAL LOW (ref 8.9–10.3)
Chloride: 97 mmol/L — ABNORMAL LOW (ref 98–111)
Creatinine, Ser: 0.75 mg/dL (ref 0.61–1.24)
GFR, Estimated: >60 mL/min (ref >60)
Glucose, Bld: 95 mg/dL (ref 70–99)
Potassium: 4.4 mmol/L (ref 3.5–5.1)
Sodium: 135 mmol/L (ref 135–145)

## 2021-05-24 LAB — MAGNESIUM: Magnesium: 2.1 mg/dL (ref 1.7–2.4)

## 2021-05-24 NOTE — Progress Notes (Signed)
The condom catheter applied to patent's penis  from previous shift appeared tight,  and it has cut into his skin on the penis.  I noted it when we were cleaning him up tonight. The condom cath device was removed and dressing applied to the pressure injury. Wound nurse consulted. Will continue to monitor. ?

## 2021-05-24 NOTE — Progress Notes (Signed)
Changed patient's H2O bottle on trach collar. Spare bottle left in room. ?

## 2021-05-24 NOTE — Progress Notes (Signed)
?PROGRESS NOTE ? ? ? ?Billy Cherry  GHW:[id number] DOB: 1970-05-02 DOA: 03/30/2021 ?PCP: Patient, No Pcp Per (Inactive)  ? ? ?Brief Narrative:  ?51 years old male with past medical history of hypertension was brought into the hospital after being found unresponsive by his wife.  EMS was called in and patient was noted to be in ventricular fibrillation with agonal respirations.  Patient was given CPR, epinephrine and had return of spontaneous circulation.  EKG showed anterolateral ST elevation and was taken to Cath Lab. Dr Josefa Half performed cath> insignificant CAD and EF 45-50%. Patient remained on ventilator after cath and was admitted to the ICU. Underwent TTM.  Neurology was consulted due to rhythmic twitching of his lips tongue and was started on Keppra.  MRI of the brain showed evidence of hypoxia with bilateral ACA MCA watershed infarcts.  2D echocardiogram showed LV ejection fraction of 20% with global hypokinesis.  Patient was also treated for ventilator associated pneumonia.  Currently status post tracheostomy and PEG tube placement.  Patient did have a spike of fever on 2/28 thought to be secondary to aspiration and was on Fortaz since respiratory culture was growing Pseudomonas.  Blood culture however grew out MRSE in 3 out of 3 bottles.  Infectious disease was consulted ,repeat blood cultures were sent and patient is being observed off antibiotic.  Patient persisted to have fever during hospitalization so infectious disease was reconsulted.  Patient underwent ultrasound of the lower extremities which showed DVT on the left lower extremity.  Eliquis was started 05/15/2021.  Patient continues to have low-grade fever while in the hospital.  Pulmonary following the patient for tracheostomy management  ? ?  ?Assessment and Plan: ?* Cardiac arrest [facility]) ?Postarrest anoxic encephalopathy ?Postarrest respiratory failure s/p TC ?S/P PEG tube placement ?Postarrest myoclonus: ?2D echocardiogram showed reduced LV  function at 20%.  Now status post trach and PEG.  Remains obtunded with anoxic encephalopathy-on Keppra, for myoclonus and can increase Klonopin for breakthrough myoclonus.  At this time, plan is home with home health-Home health/patient's wife and family will be taking turns to take care of him he will need around-the-clock care. Enteral tube feeding trach kit and supplies to be  Arranged. PCCM managing tracheostomy. ? ?Anoxic encephalopathy ([place]) ?Continue supportive care.  Patient is not responsive and has shown no improvement.  No interval changes ? ?Acute respiratory failure ([place]) ?Status post trach collar.  Continue supportive care ? ?RLL pneumonia ?With Sepsis Previously had completed vancomycin for MSSA and [name].  Received more than 7-day course of IV Fortaz. ? ? ?Fever ? Low-grade fever.Temperature max of 100.?F.  Continue respiratory care. Patient is at a very high risk of recurrent pneumonia.   WBC at 9.8. Blood cultures from 05/09/2021 negative.  Staph epidermidis in the previous culture which was thought to be contaminant.  Seen by  with Infectious disasese due to persistent fever.  No recommendation for antibiotic and further work-up.  Lower extremity ultrasound showed indeterminate DVT.  ? ?DVT (deep venous thrombosis) ([place]) ?Duplex ultrasound of lower extremity on 05/15/2021 showed age indeterminate deep vein thrombosis involving the left femoral vein, left proximal profunda vein, left posterior tibial veins, left popliteal vein, and left peroneal veins.  Due to persistent fever Eliquis has been initiated since yesterday. ? ?Dysphagia ?Continue PEG tube feeding.  Tolerating. ? ?Obesity, Class I, BMI 30-34.9 ?BMI 33.  Supportive care. ? ?HTN (hypertension) ?Continue Coreg, Isordil, amlodipine, Cardura, hydralazine and PRN Labetalol. Dose of hydralazine to 100 mg 3  times daily from 05/13/21.  Blood pressure much improved. ? ?HFrEF (heart failure with reduced ejection fraction)  ([place]) ?2D echocardiogram-LVEF less than 20%, continue coreg, imdur, amlodipine, doxazosin, hydralazine, labetalol as needed, patient is negative balance for 2918 mL. ? ?Hypernatremia ?Continue free water through the flushes.  Latest sodium of 134. ? ?Myoclonus ?Seen by neurology.  Continue current AED with Klonopin, Keppra. per neuro notes, suppressing his myoclonus will not ultimately change the prognosis  ? ?Nutrition through the PEG tube.  Continue water flushes. ? ? DVT prophylaxis:  ?apixaban (ELIQUIS) tablet 5 mg  ? ?Code Status:   ?  Code Status: Full Code ? ?Disposition:  uncertain disposition.  Likely home with home health. ? ?Status is: Inpatient ? ?Remains inpatient appropriate because: Need for tracheostomy and PEG tube care, pending Medicaid, low-grade fever, pending disposition plan. ? ? Family Communication:  ?None today.   ? ?Consultants:  ?Palliative,  ?ENT,  ?GI,  ?neuro,  ?cardiology,  ?PCCM,  ?Infectious disease ? ?Procedures:  ?IR guided gastrostomy tube placement on 04/26/2021 ?Endotracheal tube placement mechanical ventilation. ?Tracheostomy  ? ?Antimicrobials:  ?Tressie Ellis 05/02/2021>3/8 ?Vancomycin 05/08/21>05/09/21 ? ?Subjective: ? ?Today, patient was seen and examined at bedside.  No interval changes reported.  No improvement clinically.   ? ?Objective: ?Vitals:  ? 05/24/21 0416 05/24/21 0454 05/24/21 0758 05/24/21 0800  ?BP:   (!) 91/51 102/69  ?Pulse:   92 92  ?Resp:   (!) 27 (!) 27  ?Temp:    99.5 ?F (37.5 ?C)  ?TempSrc:    Oral  ?SpO2:  100% 94% 94%  ?Weight: 96 kg     ?Height:      ? ? ?Intake/Output Summary (Last 24 hours) at 05/24/2021 1025 ?Last data filed at 05/24/2021 0247 ?Gross per 24 hour  ?Intake --  ?Output 1700 ml  ?Net -1700 ml  ? ?Filed Weights  ? 05/22/21 0411 05/23/21 0436 05/24/21 0416  ?Weight: 96 kg 97 kg 96 kg  ? ?Body mass index is 30.37 kg/m?.  ? ?Physical Examination: ? ?General: Obese built, not in obvious distress, chronically ill, nonverbal, opens eyes spontaneously, no  purposeful movements ?HENT:   No scleral pallor or icterus noted. Oral mucosa is moist.  Trach collar in place. ?Chest:   Diminished breath sounds bilaterally.  ?CVS: S1 &S2 heard. No murmur.  Regular rate and rhythm. ?Abdomen: Soft, nontender, nondistended.  Bowel sounds are heard.  Tube in place without erythema induration. ?Extremities: No cyanosis, clubbing or edema.  Peripheral pulses are palpable. ?Psych: Opens eyes spontaneously, unresponsive, nonverbal ?CNS: Flaccid extremities, nonverbal, no purposeful movements. ?Skin: Warm and dry.   ? ? ?CBC: ?Recent Labs  ?Lab 05/21/21 ?0315 05/24/21 ?3149  ?WBC 13.4* 8.5  ?HGB 11.3* 10.4*  ?HCT 35.4* 33.3*  ?MCV 97.0 96.5  ?PLT 292 278  ? ? ?Basic Metabolic Panel: ?Recent Labs  ?Lab 05/19/21 ?0405 05/21/21 ?0315 05/24/21 ?7026  ?NA 135 134* 135  ?K 4.6 4.8 4.4  ?CL 99 97* 97*  ?CO2 $Rem'29 29 29  'jHHa$ ?GLUCOSE 116* 132* 95  ?BUN 20 27* 27*  ?CREATININE 0.65 0.79 0.75  ?CALCIUM 8.4* 8.2* 8.5*  ?MG 1.9 2.0 2.1  ? ? ?Liver Function Tests: ?No results for input(s): AST, ALT, ALKPHOS, BILITOT, PROT, ALBUMIN in the last 168 hours. ? ? ? ?Radiology Studies: ?No results found. ? ? ? LOS: 55 days  ? ? ?Flora Lipps, MD ?[facility] ?05/24/2021, 10:25 AM  ?  ?

## 2021-05-25 DIAGNOSIS — L8990 Pressure ulcer of unspecified site, unspecified stage: Secondary | ICD-10-CM | POA: Insufficient documentation

## 2021-05-25 NOTE — Consult Note (Signed)
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 ?I am assisted in my assessment by the patient's bedside RN, C. Jaci Carrel. ? ?Wound type:Pressure ?Pressure Injury POA: No ?Measurement:0.4cm x 2.5cm x 0.2cm ?Wound bed:Red, moist ?Drainage (amount, consistency, odor) small serous ?Periwound: intact ?Dressing procedure/placement/frequency: Patient with skin injury related to external urinary catheter.  Cleanse with NS, pat dry. Cover with xeroform gauze, change twice daily and PRN to prevent adherence. ? ?Also noted are two DTPI, one to right heel measuring 4cm x 4cm and one to the metatarsal heads measuring 2cm x 4cm.  Treatment will be with silicone foam dressings and placement of foot (feet) into Prevalon boots. Intact, purple skin discoloration beneath skin. ? ?[place] nursing team will not follow, but will remain available to this patient, the nursing and medical teams.  Please re-consult if needed. ?Thanks, ?Maudie Flakes, MSN, RN, [place], [place], CWON-AP, [place]  ?Pager# [phone]  ? ? ? ?  ?

## 2021-05-25 NOTE — Progress Notes (Signed)
?   05/25/21 1912  ?Assess: MEWS Score  ?Temp 99.2 ?F (37.3 ?C)  ?BP 116/82  ?Pulse Rate 97  ?ECG Heart Rate 97  ?Resp (!) 26 ?(respirations have been this high and higher the whole time pt has been here, pt has a trache)  ?SpO2 95 %  ?O2 Device Tracheostomy Collar  ?O2 Flow Rate (L/min) 5 L/min  ?FiO2 (%) 28 %  ?Assess: MEWS Score  ?MEWS Temp 0  ?MEWS Systolic 0  ?MEWS Pulse 0  ?MEWS RR 2  ?MEWS LOC 3  ?MEWS Score 5  ?MEWS Score Color Red  ?Assess: if the MEWS score is Yellow or Red  ?Were vital signs taken at a resting state? Yes  ?Focused Assessment No change from prior assessment  ?Early Detection of Sepsis Score *See Row Information* Low  ?MEWS guidelines implemented *See Row Information* No, previously red, continue vital signs every 4 hours  ?Document  ?Patient Outcome Stabilized after interventions  ?Progress note created (see row info) Yes  ? ? ?

## 2021-05-25 NOTE — Progress Notes (Signed)
?PROGRESS NOTE ? ? ? ?Billy Cherry  WHQ:[id number] DOB: June 29, 1970 DOA: 03/30/2021 ?PCP: Billy Cherry, No Pcp Per (Inactive)  ? ? ?Brief Narrative:  ?51 years old male with past medical history of hypertension was brought into the hospital after being found unresponsive by his wife.  EMS was called in and Billy Cherry was noted to be in ventricular fibrillation with agonal respirations.  Billy Cherry was given CPR, epinephrine and had return of spontaneous circulation.  EKG showed anterolateral ST elevation and was taken to Cath Lab. Dr Josefa Half performed cath> insignificant CAD and EF 45-50%. Billy Cherry remained on ventilator after cath and was admitted to the ICU. Underwent TTM.  Neurology was consulted due to rhythmic twitching of his lips tongue and was started on Keppra.  MRI of the brain showed evidence of hypoxia with bilateral ACA MCA watershed infarcts.  2D echocardiogram showed LV ejection fraction of 20% with global hypokinesis.  Billy Cherry was also treated for ventilator associated pneumonia.  Currently status post tracheostomy and PEG tube placement.  Billy Cherry did have a spike of fever on 2/28 thought to be secondary to aspiration and was on Fortaz since respiratory culture was growing Pseudomonas.  Blood culture however grew out MRSE in 3 out of 3 bottles.  Infectious disease was consulted ,repeat blood cultures were sent and Billy Cherry is being observed off antibiotic.  Billy Cherry persisted to have fever during hospitalization so infectious disease was reconsulted.  Billy Cherry underwent ultrasound of the lower extremities which showed DVT on the left lower extremity.  Eliquis was started 05/15/2021.  Billy Cherry continues to have low-grade fever while in the hospital.  Pulmonary following the Billy Cherry for tracheostomy management  ? ?  ?Assessment and Plan: ?* Cardiac arrest [name]) ?Postarrest anoxic encephalopathy ?Postarrest respiratory failure s/p TC ?S/P PEG tube placement ?Postarrest myoclonus: ?2D echocardiogram showed reduced LV  function at 20%.  Now status post trach and PEG.  Remains obtunded with anoxic encephalopathy-on Keppra, for myoclonus and can increase Klonopin for breakthrough myoclonus.  At this time, plan is home with home health-Home health/Billy Cherry's wife and family will be taking turns to take care of him he will need around-the-clock care. Enteral tube feeding trach kit and supplies to be  Arranged. PCCM managing tracheostomy. ? ?Anoxic encephalopathy (Billy Cherry) ?Continue supportive care.  Billy Cherry is not responsive and has shown no improvement.  No interval changes ? ?Acute respiratory failure ([name]) ?Status post trach collar.  Continue supportive care ? ?RLL pneumonia ?With Sepsis Previously had completed vancomycin for MSSA and [name].  Received more than 7-day course of IV Fortaz. ? ? ?Fever ? Low-grade fever.Temperature max of 99.8.?F.  Continue respiratory care. Billy Cherry is at a very high risk of recurrent pneumonia.   WBC at 9.8. Blood cultures from 05/09/2021 negative.  Staph epidermidis in the previous culture which was thought to be contaminant.  Seen by  with Infectious disasese due to persistent fever.  No recommendation for antibiotic and further work-up.  Lower extremity ultrasound showed indeterminate DVT.  ? ?DVT (deep venous thrombosis) ([place]) ?Duplex ultrasound of lower extremity on 05/15/2021 showed age indeterminate deep vein thrombosis involving the left femoral vein, left proximal profunda vein, left posterior tibial veins, left popliteal vein, and left peroneal veins.  Due to persistent fever Eliquis has been initiated since yesterday. ? ?Dysphagia ?Continue PEG tube feeding.  Tolerating. ? 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 Subcutaneous fat may be visible but bone, tendon or muscle are NOT  exposed. Pink, red (Active)  ?05/24/21 2250  ?Location: Penis  ?Location Orientation: Lateral  ?Staging: Stage 3 -  Full thickness tissue  loss. Subcutaneous fat may be visible but bone, tendon or muscle are NOT exposed.  ?Wound Description (Comments): Pink, red  ?Present on Admission: No  ? ?Continue wound care ? ?Obesity, Class I, BMI 30-34.9 ?BMI 33.  Supportive care. ? ?HTN (hypertension) ?Continue Coreg, Isordil, amlodipine, Cardura, hydralazine and PRN Labetalol. Dose of hydralazine to 100 mg 3 times daily from 05/13/21.  Blood pressure much improved. ? ?HFrEF (heart failure with reduced ejection fraction) ([place]) ?2D echocardiogram-LVEF less than 20%, continue coreg, imdur, amlodipine, doxazosin, hydralazine, labetalol as needed, Billy Cherry is negative balance for 2918 mL. ? ?Hypernatremia ?Continue free water through the flushes.  Latest sodium of 134. ? ?Myoclonus ?Seen by neurology.  Continue current AED with Klonopin, Keppra. per neuro notes, suppressing his myoclonus will not ultimately change the prognosis  ? ?Nutrition through the PEG tube.  Continue water flushes. ? ? ? DVT prophylaxis:  ?apixaban (ELIQUIS) tablet 5 mg  ? ?Code Status:   ?  Code Status: Full Code ? ?Disposition:  uncertain disposition.  Likely home with home health. ? ?Status is: Inpatient ? ?Remains inpatient appropriate because: Need for tracheostomy and PEG tube care, pending Medicaid, low-grade fever, pending disposition plan. ? ? Family Communication:  ?None today.   ? ?Consultants:  ?Palliative,  ?ENT,  ?GI,  ?neuro,  ?cardiology,  ?PCCM,  ?Infectious disease ? ?Procedures:  ?IR guided gastrostomy tube placement on 04/26/2021 ?Endotracheal tube placement mechanical ventilation. ?Tracheostomy  ? ?Antimicrobials:  ?Tressie Ellis 05/02/2021>3/8 ?Vancomycin 05/08/21>05/09/21 ? ?Subjective: ? ?Today, Billy Cherry was seen and examined at bedside.  No interval complaints reported.  No improvement in cognition reported. ? ?Objective: ?Vitals:  ? 05/25/21 0539 05/25/21 0800 05/25/21 1000 05/25/21 1035  ?BP:  (!) 148/113 (!) 140/104 (!) 123/92  ?Pulse: 96 (!) 101 94 98  ?Resp: (!) 21 (!) 30 (!) 26  (!) 26  ?Temp:   98.1 ?F (36.7 ?C) 99.5 ?F (37.5 ?C)  ?TempSrc:   Axillary Oral  ?SpO2: 100% 98% 97% 97%  ?Weight:      ?Height:      ? ? ?Intake/Output Summary (Last 24 hours) at 05/25/2021 1107 ?Last data filed at 05/25/2021 0545 ?Gross per 24 hour  ?Intake 1528 ml  ?Output 1400 ml  ?Net 128 ml  ? ?Filed Weights  ? 05/23/21 0436 05/24/21 0416 05/25/21 0139  ?Weight: 97 kg 96 kg 95 kg  ? ?Body mass index is 30.05 kg/m?.  ? ?Physical Examination: ? ?General: Obese built, not in obvious distress, chronically ill, opens eyes spontaneously.  No meaningful response. ?HENT:   No scleral pallor or icterus noted. Oral mucosa is moist.  Tracheostomy in place. ?Chest:    Diminished breath sounds bilaterally. No crackles or wheezes.  ?CVS: S1 &S2 heard. No murmur.  Regular rate and rhythm. ?Abdomen: Soft, nontender, nondistended.  Bowel sounds are heard.  PEG tube in place. ?Extremities: No cyanosis, clubbing or edema.  Peripheral pulses are palpable. ?Psych: Opens eyes spontaneously, nonverbal, ?CNS: No meaningful response, eye-opening spontaneous, flaccid extremities, ?Skin: Warm and dry.  Dorsal penis pressure ulceration stage III ? ? ?CBC: ?Recent Labs  ?Lab 05/21/21 ?0315 05/24/21 ?8882  ?WBC 13.4* 8.5  ?HGB 11.3* 10.4*  ?HCT 35.4* 33.3*  ?MCV 97.0 96.5  ?PLT 292 278  ? ? ?Basic Metabolic Panel: ?Recent Labs  ?Lab 05/19/21 ?0405 05/21/21 ?0315 05/24/21 ?8003  ?NA 135 134* 135  ?  K 4.6 4.8 4.4  ?CL 99 97* 97*  ?CO2 $Rem'29 29 29  'UEfl$ ?GLUCOSE 116* 132* 95  ?BUN 20 27* 27*  ?CREATININE 0.65 0.79 0.75  ?CALCIUM 8.4* 8.2* 8.5*  ?MG 1.9 2.0 2.1  ? ? ?Liver Function Tests: ?No results for input(s): AST, ALT, ALKPHOS, BILITOT, PROT, ALBUMIN in the last 168 hours. ? ? ? ?Radiology Studies: ?No results found. ? ? ? LOS: 56 days  ? ? ?Flora Lipps, MD ?[facility] ?05/25/2021, 11:07 AM  ?  ?

## 2021-05-25 NOTE — Progress Notes (Signed)
Patient wife called requesting updates.Updates given.Upon asking her about trach teachings, she stated that she is ok with trach suctioning,cleaning and changing inner cannula. She stated that all she needs is learning PEG tube feeding and medication administration via PEG. ?

## 2021-05-25 NOTE — Progress Notes (Signed)
Patient's wife notified of the pressure injury this morning. ?

## 2021-05-25 NOTE — Progress Notes (Signed)
?   05/25/21 2037  ?Assess: MEWS Score  ?BP 116/78  ?Pulse Rate 96  ?Resp 19  ?SpO2 95 %  ?O2 Device Tracheostomy Collar  ?O2 Flow Rate (L/min) 5 L/min  ?FiO2 (%) 28 %  ?Assess: MEWS Score  ?MEWS Temp 0  ?MEWS Systolic 0  ?MEWS Pulse 0  ?MEWS RR 0  ?MEWS LOC 3  ?MEWS Score 3  ?MEWS Score Color Yellow  ?Assess: if the MEWS score is Yellow or Red  ?Were vital signs taken at a resting state? Yes  ?Focused Assessment No change from prior assessment  ?Early Detection of Sepsis Score *See Row Information* Low  ?MEWS guidelines implemented *See Row Information* No, other (Comment) ?(when pt coughs his respirations increase)  ?Document  ?Patient Outcome Stabilized after interventions  ?Progress note created (see row info) Yes  ? ? ?

## 2021-05-25 NOTE — TOC Progression Note (Signed)
Transition of Care (TOC) - Progression Note  ? ? ?Patient Details  ?Name: Billy Cherry ?MRN: [id number] ?Date of Birth: October 20, 1970 ? ?Transition of Care (TOC) CM/SW Contact  ?Verdell Carmine, RN ?Phone Number: ?05/25/2021, 4:56 PM ? ?Clinical Narrative:    ? ?Patient progression note. Patient is same as far as mentation, has trach on mist collar and PEG tube. Wife states to nurse  that she feels comfortable with trach care, suctioning, and cleaning.  Will need work on PEG and feeding. [facility] for [facility] assistance. [facility] bed ordered. Will need tube feeding and pump ( unless bolus feeds) suction , oxygen ordered closer to time of DC to home.  ?Plan: Continue teaching, wife needs to be staying in hospital to turn patient Q2h, suction, G tube care, General observation. Patient has continued with a low grade fever.  ? ?Expected Discharge Plan: [place] ?Barriers to Discharge: Barriers Resolved ? ?Expected Discharge Plan and Services ?Expected Discharge Plan: [place] ?In-house Referral: Hospice / Palliative Care ?Discharge Planning Services: CM Consult ?Post Acute Care Choice: Long Term Acute Care (LTAC) ?Living arrangements for the past 2 months: [place] ?                ?DME Arranged: N/A ?DME Agency: [employer] ?  ?  ?  ?HH Arranged: Nurse's Aide ?[place] Agency: [place] ?  ?  ?Representative spoke with at [place]: [name] ? ? ?Social Determinants of Health (SDOH) Interventions ?  ? ?Readmission Risk Interventions ? ?  04/14/2021  ? 11:14 AM  ?Readmission Risk Prevention Plan  ?Transportation Screening Complete  ?[place] or Home Care Consult Complete  ?Social Work Consult for [place] Planning/Counseling Complete  ?Palliative Care Screening Complete  ?Medication Review [employer]) Complete  ? ? ?

## 2021-05-25 NOTE — Assessment & Plan Note (Addendum)
Stage I to right heel. Now resolved.

## 2021-05-26 MED ORDER — SPIRONOLACTONE 25 MG PO TABS
25.0000 mg | ORAL_TABLET | Freq: Every day | ORAL | Status: DC
  Administered 2021-05-27 – 2021-06-07 (×12): 25 mg via ORAL
  Filled 2021-05-26 (×12): qty 1

## 2021-05-26 NOTE — Progress Notes (Signed)
?   05/26/21 1132  ?Assess: MEWS Score  ?Temp 98.4 ?F (36.9 ?C)  ?BP 133/88  ?Pulse Rate 94  ?Resp (!) 25  ?SpO2 96 %  ?O2 Device Tracheostomy Collar  ?O2 Flow Rate (L/min) 5 L/min  ?FiO2 (%) 28 %  ?Assess: MEWS Score  ?MEWS Temp 0  ?MEWS Systolic 0  ?MEWS Pulse 0  ?MEWS RR 1  ?MEWS LOC 2  ?MEWS Score 3  ?MEWS Score Color Yellow  ?Assess: if the MEWS score is Yellow or Red  ?Were vital signs taken at a resting state? Yes  ?Focused Assessment No change from prior assessment  ?Early Detection of Sepsis Score *See Row Information* Low  ?MEWS guidelines implemented *See Row Information* No, previously red, continue vital signs every 4 hours  ?Treat  ?MEWS Interventions Administered scheduled meds/treatments  ?Take Vital Signs  ?Increase Vital Sign Frequency  Yellow: Q 2hr X 2 then Q 4hr X 2, if remains yellow, continue Q 4hrs  ?Notify: Charge Nurse/RN  ?Name of Charge Nurse/RN Notified Alphonsus Sias, RN  ?Date Charge Nurse/RN Notified 05/26/21  ?Time Charge Nurse/RN Notified 1200  ?Document  ?Progress note created (see row info) Yes  ? ?Patient's MEWS fluctuates between yellow and red.  ?

## 2021-05-26 NOTE — Progress Notes (Signed)
?   05/26/21 0225  ?Assess: MEWS Score  ?Pulse Rate (!) 101  ?Resp (!) 28  ?SpO2 97 %  ?O2 Device Tracheostomy Collar  ?O2 Flow Rate (L/min) 5 L/min  ?FiO2 (%) 28 %  ?Assess: MEWS Score  ?MEWS Temp 0  ?MEWS Systolic 0  ?MEWS Pulse 1  ?MEWS RR 2  ?MEWS LOC 3  ?MEWS Score 6  ?MEWS Score Color Red  ?Assess: if the MEWS score is Yellow or Red  ?Were vital signs taken at a resting state? Yes  ?Focused Assessment No change from prior assessment  ?Early Detection of Sepsis Score *See Row Information* Low  ?MEWS guidelines implemented *See Row Information* No, other (Comment) ?(respirations have actually improved, but will increase when he coughs or gets suctions)  ?Document  ?Patient Outcome Stabilized after interventions  ?Progress note created (see row info) Yes  ? ? ?

## 2021-05-26 NOTE — Progress Notes (Signed)
?PROGRESS NOTE ? ? ? ?Billy Cherry  SEG:[id number] DOB: 1970/05/01 DOA: 03/30/2021 ?PCP: Patient, No Pcp Per (Inactive)  ? ? ?Brief Narrative:  ?51 years old male with past medical history of hypertension was brought into the hospital after being found unresponsive by his wife.  EMS was called in and patient was noted to be in ventricular fibrillation with agonal respirations.  Patient was given CPR, epinephrine and had return of spontaneous circulation.  EKG showed anterolateral ST elevation and was taken to Cath Lab. Dr Josefa Half performed cath> insignificant CAD and EF 45-50%. Patient remained on ventilator after cath and was admitted to the ICU. Underwent TTM.  Neurology was consulted due to rhythmic twitching of his lips tongue and was started on Keppra.  MRI of the brain showed evidence of hypoxia with bilateral ACA MCA watershed infarcts.  2D echocardiogram showed LV ejection fraction of 20% with global hypokinesis.  Patient was also treated for ventilator associated pneumonia.  Currently status post tracheostomy and PEG tube placement.  Patient did have a spike of fever on 2/28 thought to be secondary to aspiration and was on Fortaz since respiratory culture was growing Pseudomonas.  Blood culture however grew out MRSE in 3 out of 3 bottles.  Infectious disease was consulted ,repeat blood cultures were sent and patient is being observed off antibiotic.  Patient persisted to have fever during hospitalization so infectious disease was reconsulted.  Patient underwent ultrasound of the lower extremities which showed DVT on the left lower extremity.  Eliquis was started 05/15/2021.  Patient continues to have low-grade fever while in the hospital.  Pulmonary following the patient for tracheostomy management  ? ?  ?Assessment and Plan: ?* Cardiac arrest [facility]) ?Postarrest anoxic encephalopathy ?Postarrest respiratory failure s/p TC ?S/P PEG tube placement ?Postarrest myoclonus: ?2D echocardiogram showed reduced LV  function at 20%.  Now status post trach and PEG.  Remains obtunded with anoxic encephalopathy-on Keppra, for myoclonus and can increase Klonopin for breakthrough myoclonus.  At this time, plan is home with home health-Home health/patient's wife and family will be taking turns to take care of him he will need around-the-clock care. Enteral tube feeding trach kit and supplies to be  Arranged. PCCM managing tracheostomy. ? 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) ?Continue supportive care.  Patient is not responsive and has shown no improvement.  No interval changes ? ?Acute respiratory failure ([place]) ?Status post trach collar.  Continue supportive care ? ?RLL pneumonia ?With Sepsis Previously had completed vancomycin for MSSA and [name].  Received more than 7-day course of IV Fortaz. ? ? ?Fever ? Low-grade fever.Temperature max of 99.5.?F.  Continue respiratory care. Patient is at a very high risk of recurrent pneumonia.   WBC at 9.8. Blood cultures from 05/09/2021 negative.  Staph epidermidis in the previous culture which was thought to be contaminant.  Seen by  with Infectious disasese due to persistent fever.  No recommendation for antibiotic and further work-up.  Lower extremity ultrasound showed indeterminate DVT.  ? ?DVT (deep venous thrombosis) ([place]) ?Duplex ultrasound of lower extremity on 05/15/2021 showed age indeterminate deep vein thrombosis involving the left femoral vein, left proximal profunda vein, left posterior tibial veins, left popliteal vein, and left peroneal veins.  On eliquis ? ?Dysphagia ?Continue PEG tube feeding.  Tolerating. ? 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 Subcutaneous fat may be visible but bone, tendon or muscle are NOT exposed. Pink, red (Active)  ?05/24/21 2250  ?  Location: Penis  ?Location Orientation: Lateral  ?Staging: Stage 3 -  Full thickness tissue loss. Subcutaneous fat may be visible but bone, tendon or  muscle are NOT exposed.  ?Wound Description (Comments): Pink, red  ?Present on Admission: No  ? ?Continue wound care ? ?Obesity, Class I, BMI 30-34.9 ?BMI 33.  Supportive care. ? ?HTN (hypertension) ?Continue Coreg, Isordil, amlodipine, Cardura, hydralazine and PRN Labetalol. Dose of hydralazine to 100 mg 3 times daily from 05/13/21.  Blood pressure much improved. ? ?HFrEF (heart failure with reduced ejection fraction) ([place]) ?2D echocardiogram-LVEF less than 20%, continue coreg, imdur, amlodipine, doxazosin, hydralazine, labetalol as needed, patient is negative balance for 2918 mL. ? ?Hypernatremia ?Sodium slightly hyponatremic so we will decrease water flushes.   ? ?Myoclonus ?Seen by neurology.  Continue current AED with Klonopin, Keppra. per neuro notes, suppressing his myoclonus will not ultimately change the prognosis  ? ?Nutrition through the PEG tube.  Continue water flushes. ? ? ? DVT prophylaxis:  ?apixaban (ELIQUIS) tablet 5 mg  ? ?Code Status:   ?  Code Status: Full Code ? ?Disposition:  uncertain disposition.  Likely home with home health. ? ?Status is: Inpatient ? ?Remains inpatient appropriate because: Need for tracheostomy and PEG tube care, pending Medicaid, low-grade fever, pending disposition plan. ? ? Family Communication:  ?None today.   ? ?Consultants:  ?Palliative,  ?ENT,  ?GI,  ?neuro,  ?cardiology,  ?PCCM,  ?Infectious disease ? ?Procedures:  ?IR guided gastrostomy tube placement on 04/26/2021 ?Endotracheal tube placement mechanical ventilation. ?Tracheostomy  ? ?Antimicrobials:  ?Tressie Ellis 05/02/2021>3/8 ?Vancomycin 05/08/21>05/09/21 ? ?Subjective: ? ?Today, patient was seen and examined at bedside.  No interval complaints reported.  Patient is nonverbal.   ? ?Objective: ?Vitals:  ? 05/26/21 0041 05/26/21 0225 05/26/21 0300 05/26/21 0845  ?BP:   128/88 (!) 134/99  ?Pulse:  (!) 101 100 (!) 102  ?Resp:  (!) 28 (!) 22 (!) 26  ?Temp:   99.4 ?F (37.4 ?C)   ?TempSrc:   Oral   ?SpO2:  97% 95% 98%  ?Weight:  96 kg     ?Height:      ? ? ?Intake/Output Summary (Last 24 hours) at 05/26/2021 1006 ?Last data filed at 05/26/2021 0356 ?Gross per 24 hour  ?Intake 555 ml  ?Output 1950 ml  ?Net -1395 ml  ? ?Filed Weights  ? 05/24/21 0416 05/25/21 0139 05/26/21 0041  ?Weight: 96 kg 95 kg 96 kg  ? ?Body mass index is 30.37 kg/m?.  ? ?Physical Examination: ? ?General: Obese built, not in obvious distress, chronically ill, opens eyes spontaneously, no meaningful response. ?HENT:   No scleral pallor or icterus noted. Oral mucosa is moist.  Trach collar in place. ?Chest: Diminished breath sounds bilaterally. No crackles or wheezes.  ?CVS: S1 &S2 heard. No murmur.  Regular rate and rhythm. ?Abdomen: Soft, nontender, nondistended.  Bowel sounds are heard.  PEG tube in place. ?Extremities: No cyanosis, clubbing or edema.  Peripheral pulses are palpable. ?Psych: Nonverbal, none meaningful response, opens eyes spontaneously ?CNS: Flaccid extremities ?Skin: Warm and dry.  No rashes noted. ? ? ? ?CBC: ?Recent Labs  ?Lab 05/21/21 ?0315 05/24/21 ?1438  ?WBC 13.4* 8.5  ?HGB 11.3* 10.4*  ?HCT 35.4* 33.3*  ?MCV 97.0 96.5  ?PLT 292 278  ? ? ?Basic Metabolic Panel: ?Recent Labs  ?Lab 05/21/21 ?0315 05/24/21 ?8875  ?NA 134* 135  ?K 4.8 4.4  ?CL 97* 97*  ?CO2 29 29  ?GLUCOSE 132* 95  ?BUN 27* 27*  ?CREATININE  0.79 0.75  ?CALCIUM 8.2* 8.5*  ?MG 2.0 2.1  ? ? ?Liver Function Tests: ?No results for input(s): AST, ALT, ALKPHOS, BILITOT, PROT, ALBUMIN in the last 168 hours. ? ? ? ?Radiology Studies: ?No results found. ? ? ? LOS: 57 days  ? ? ?Flora Lipps, MD ?[facility] ?05/26/2021, 10:06 AM  ?  ?

## 2021-05-26 NOTE — Progress Notes (Signed)
?   05/26/21 0300  ?Assess: MEWS Score  ?BP 128/88  ?Pulse Rate 100  ?Resp (!) 22  ?SpO2 95 %  ?O2 Device Tracheostomy Collar  ?O2 Flow Rate (L/min) 5 L/min  ?FiO2 (%) 28 %  ?Assess: MEWS Score  ?MEWS Temp 0  ?MEWS Systolic 0  ?MEWS Pulse 0  ?MEWS RR 1  ?MEWS LOC 3  ?MEWS Score 4  ?MEWS Score Color Red  ?Assess: if the MEWS score is Yellow or Red  ?Were vital signs taken at a resting state? Yes  ?Focused Assessment No change from prior assessment  ?Early Detection of Sepsis Score *See Row Information* Low  ?MEWS guidelines implemented *See Row Information* No, previously red, continue vital signs every 4 hours  ?Document  ?Patient Outcome Stabilized after interventions  ?Progress note created (see row info) Yes  ? ? ?

## 2021-05-26 NOTE — TOC Progression Note (Signed)
Transition of Care (TOC) - Progression Note  ? ? ?Patient Details  ?Name: Duquan Gillooly ?MRN: [id number] ?Date of Birth: 1970/09/12 ? ?Transition of Care (TOC) CM/SW Contact  ?Leone Haven, RN ?Phone Number: ?05/26/2021, 4:37 PM ? ?Clinical Narrative:    ?There is documented teaching with wife on 3/13 and 3/19 , per Staff RN note on 3/23 when she spoke with wife she states-  she is ok with trach suctioning,cleaning and changing inner cannula. She stated that all she needs is learning PEG tube feeding and medication administration via PEG. ? ? ?Expected Discharge Plan: Home w Home Health Services ?Barriers to Discharge: Barriers Resolved ? ?Expected Discharge Plan and Services ?Expected Discharge Plan: Home w Home Health Services ?In-house Referral: Hospice / Palliative Care ?Discharge Planning Services: CM Consult ?Post Acute Care Choice: Long Term Acute Care (LTAC) ?Living arrangements for the past 2 months: Single Family Home ?                ?DME Arranged: N/A ?DME Agency: [employer] ?  ?  ?  ?HH Arranged: Nurse's Aide ?HH Agency: [facility] ?  ?  ?Representative spoke with at [facility] Agency: [name] ? ? ?Social Determinants of Health (SDOH) Interventions ?  ? ?Readmission Risk Interventions ? ?  04/14/2021  ? 11:14 AM  ?Readmission Risk Prevention Plan  ?Transportation Screening Complete  ?HRI or Home Care Consult Complete  ?Social Work Consult for Recovery Care Planning/Counseling Complete  ?Palliative Care Screening Complete  ?Medication Review [employer]) Complete  ? ? ?

## 2021-05-26 NOTE — Progress Notes (Signed)
?   05/26/21 1546  ?Assess: MEWS Score  ?Temp 98.5 ?F (36.9 ?C)  ?BP 121/89  ?Pulse Rate 94  ?ECG Heart Rate 94  ?Resp (!) 26  ?SpO2 95 %  ?O2 Device Tracheostomy Collar  ?O2 Flow Rate (L/min) 5 L/min  ?FiO2 (%) 28 %  ?Assess: MEWS Score  ?MEWS Temp 0  ?MEWS Systolic 0  ?MEWS Pulse 0  ?MEWS RR 2  ?MEWS LOC 1  ?MEWS Score 3  ?MEWS Score Color Yellow  ?Assess: if the MEWS score is Yellow or Red  ?Were vital signs taken at a resting state? No ?(Just finished suctioning patient)  ?Focused Assessment No change from prior assessment  ?Early Detection of Sepsis Score *See Row Information* Low  ?MEWS guidelines implemented *See Row Information* No, previously yellow, continue vital signs every 4 hours  ?Treat  ?MEWS Interventions Administered scheduled meds/treatments  ?Take Vital Signs  ?Increase Vital Sign Frequency  Yellow: Q 2hr X 2 then Q 4hr X 2, if remains yellow, continue Q 4hrs  ?Escalate  ?MEWS: Escalate Yellow: discuss with charge nurse/RN and consider discussing with provider and RRT  ?Notify: Charge Nurse/RN  ?Name of Charge Nurse/RN Notified Alphonsus Sias, RN  ?Date Charge Nurse/RN Notified 05/26/21  ?Time Charge Nurse/RN Notified 1546  ?Document  ?Patient Outcome Stabilized after interventions  ?Progress note created (see row info) Yes  ? ? ?

## 2021-05-27 LAB — BASIC METABOLIC PANEL
Anion gap: 9 (ref 5–15)
BUN: 20 mg/dL (ref 6–20)
CO2: 28 mmol/L (ref 22–32)
Calcium: 8.7 mg/dL — ABNORMAL LOW (ref 8.9–10.3)
Chloride: 99 mmol/L (ref 98–111)
Creatinine, Ser: 0.62 mg/dL (ref 0.61–1.24)
GFR, Estimated: >60 mL/min (ref >60)
Glucose, Bld: 102 mg/dL — ABNORMAL HIGH (ref 70–99)
Potassium: 4.5 mmol/L (ref 3.5–5.1)
Sodium: 136 mmol/L (ref 135–145)

## 2021-05-27 NOTE — Progress Notes (Signed)
?PROGRESS NOTE ? ? ? ?Billy Cherry  PNT:[id number] DOB: May 27, 1970 DOA: 03/30/2021 ?PCP: Patient, No Pcp Per (Inactive)  ? ? ?Brief Narrative:  ?51 years old male with past medical history of hypertension was brought into the hospital after being found unresponsive by his wife.  EMS was called in and patient was noted to be in ventricular fibrillation with agonal respirations.  Patient was given CPR, epinephrine and had return of spontaneous circulation.  EKG showed anterolateral ST elevation and was taken to Cath Lab. Dr Josefa Half performed cath> insignificant CAD and EF 45-50%. Patient remained on ventilator after cath and was admitted to the ICU. Underwent TTM.  Neurology was consulted due to rhythmic twitching of his lips tongue and was started on Keppra.  MRI of the brain showed evidence of hypoxia with bilateral ACA MCA watershed infarcts.  2D echocardiogram showed LV ejection fraction of 20% with global hypokinesis.  Patient was also treated for ventilator associated pneumonia.  Currently status post tracheostomy and PEG tube placement.  Patient did have a spike of fever on 2/28 thought to be secondary to aspiration and was on Fortaz since respiratory culture was growing Pseudomonas.  Blood culture however grew out MRSE in 3 out of 3 bottles.  Infectious disease was consulted ,repeat blood cultures were sent and patient is being observed off antibiotic.  Patient persisted to have fever during hospitalization so infectious disease was reconsulted.  Patient underwent ultrasound of the lower extremities which showed DVT on the left lower extremity.  Eliquis was started 05/15/2021.  Patient continues to have low-grade fever while in the hospital.  Pulmonary following the patient for tracheostomy management  ? ?  ?Assessment and Plan: ?* Cardiac arrest [facility]) ?Postarrest anoxic encephalopathy ?Postarrest respiratory failure s/p TC ?S/P PEG tube placement ?Postarrest myoclonus: ?2D echocardiogram showed reduced LV  function at 20%.  Now status post trach and PEG.  Remains obtunded with anoxic encephalopathy-on Keppra, for myoclonus and can increase Klonopin for breakthrough myoclonus.  At this time, plan is home with home health-Home health/patient's wife and family will be taking turns to take care of him he will need around-the-clock care. Enteral tube feeding trach kit and supplies to be  Arranged. PCCM managing tracheostomy. ? ?Anoxic encephalopathy ([place]) ?Continue supportive care.  Patient is not responsive and has shown no improvement.  No interval changes ? ?Acute respiratory failure ([place]) ?Status post trach collar.  Continue supportive care ? ?RLL pneumonia ?With Sepsis Previously had completed vancomycin for MSSA and [name].  Received more than 7-day course of IV Fortaz. ? ? ?Fever ? Low-grade fever.Temperature max of 100.5.?F.  Continue respiratory care. Patient is at a very high risk of recurrent pneumonia.  Latest WBC at 9.8.Marland Kitchen Blood cultures from 05/09/2021 negative.  Staph epidermidis in the previous culture which was thought to be contaminant.  Seen by  with Infectious disasese due to persistent fever.  No recommendation for antibiotic and further work-up.  Lower extremity ultrasound showed indeterminate DVT.  Will check CBC in AM. ? ?DVT (deep venous thrombosis) ([place]) ?Duplex ultrasound of lower extremity on 05/15/2021 showed age indeterminate deep vein thrombosis involving the left femoral vein, left proximal profunda vein, left posterior tibial veins, left popliteal vein, and left peroneal veins.  On eliquis ? ?Dysphagia ?Continue PEG tube feeding.  Tolerating. ? 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 Subcutaneous fat may be visible but bone, tendon or muscle are NOT exposed. Pink, red (  Active)  ?05/24/21 2250  ?Location: Penis  ?Location Orientation: Lateral  ?Staging: Stage 3 -  Full thickness tissue loss. Subcutaneous fat may  be visible but bone, tendon or muscle are NOT exposed.  ?Wound Description (Comments): Pink, red  ?Present on Admission: No  ? ?Continue wound care ? ?Obesity, Class I, BMI 30-34.9 ?BMI 33.  Supportive care. ? ?HTN (hypertension) ?Continue Coreg, Isordil, amlodipine, Cardura, hydralazine and PRN Labetalol. Dose of hydralazine to 100 mg 3 times daily from 05/13/21.  Blood pressure much improved. ? ?HFrEF (heart failure with reduced ejection fraction) ([place]) ?2D echocardiogram-LVEF less than 20%, continue coreg, imdur, amlodipine, doxazosin, hydralazine, labetalol as needed, patient is negative balance for 19565 mL. ? ?Hypernatremia ?Was trending towards hyponatremia so water flushes has been decreased.  Latest sodium of 136. ? ?Myoclonus ?Seen by neurology.  Continue current AED with Klonopin, Keppra. per neuro notes, suppressing his myoclonus will not ultimately change the prognosis  ? ?Nutrition through the PEG tube.  Continue water flushes. ? ? ? DVT prophylaxis:  ?apixaban (ELIQUIS) tablet 5 mg  ? ?Code Status:   ?  Code Status: Full Code ? ?Disposition:  uncertain disposition.  Likely home with home health. ? ?Status is: Inpatient ? ?Remains inpatient appropriate because: Need for tracheostomy and PEG tube care, pending Medicaid, low-grade fever, pending disposition plan. ? ? Family Communication:  ?None today.   ? ?Consultants:  ?Palliative,  ?ENT,  ?GI,  ?neuro,  ?cardiology,  ?PCCM,  ?Infectious disease ? ?Procedures:  ?IR guided gastrostomy tube placement on 04/26/2021 ?Endotracheal tube placement mechanical ventilation. ?Tracheostomy  ? ?Antimicrobials:  ?Tressie Ellis 05/02/2021>3/8 ?Vancomycin 05/08/21>05/09/21 ? ?Subjective: ? ?Today, patient was seen and examined at bedside.  Nonverbal.  Nursing staff reported a lot of secretions and needed suctioning. ? ?Objective: ?Vitals:  ? 05/27/21 0400 05/27/21 0422 05/27/21 0800 05/27/21 0828  ?BP: 116/83  (!) 126/92   ?Pulse: 95  91 92  ?Resp: 20  (!) 24 (!) 26  ?Temp: (!)  100.5 ?F (38.1 ?C)  99.7 ?F (37.6 ?C)   ?TempSrc: Oral  Oral   ?SpO2: 98%  98% 99%  ?Weight:  97 kg    ?Height:      ? ? ?Intake/Output Summary (Last 24 hours) at 05/27/2021 0858 ?Last data filed at 05/27/2021 0423 ?Gross per 24 hour  ?Intake --  ?Output 1200 ml  ?Net -1200 ml  ? ?Filed Weights  ? 05/25/21 0139 05/26/21 0041 05/27/21 0422  ?Weight: 95 kg 96 kg 97 kg  ? ?Body mass index is 30.68 kg/m?.  ? ?Physical Examination: ? ?General: Obese built built, not in obvious distress, nonverbal, no meaningful response to stimuli, chronically ill, ?HENT:   No scleral pallor or icterus noted. Oral mucosa is moist.  Tracheostomy collar in place.  Copious secretions ?Chest:   Diminished breath sounds bilaterally. ?CVS: S1 &S2 heard. No murmur.  Regular rate and rhythm. ?Abdomen: Soft, nontender, nondistended.  Bowel sounds are heard.  PEG tube in place. ?Extremities: No cyanosis, clubbing or edema.  Peripheral pulses are palpable. ?Psych: Nonverbal. ?CNS: No meaningful response, flaccid extremities, opens eyes spontaneously, ?Skin: Warm and dry.  ? ? ? ? ?CBC: ?Recent Labs  ?Lab 05/21/21 ?0315 05/24/21 ?1610  ?WBC 13.4* 8.5  ?HGB 11.3* 10.4*  ?HCT 35.4* 33.3*  ?MCV 97.0 96.5  ?PLT 292 278  ? ? ?Basic Metabolic Panel: ?Recent Labs  ?Lab 05/21/21 ?0315 05/24/21 ?9604 05/27/21 ?5409  ?NA 134* 135 136  ?K 4.8 4.4 4.5  ?CL 97* 97*  99  ?CO2 $Rem'29 29 28  'kjbG$ ?GLUCOSE 132* 95 102*  ?BUN 27* 27* 20  ?CREATININE 0.79 0.75 0.62  ?CALCIUM 8.2* 8.5* 8.7*  ?MG 2.0 2.1  --   ? ? ?Liver Function Tests: ?No results for input(s): AST, ALT, ALKPHOS, BILITOT, PROT, ALBUMIN in the last 168 hours. ? ? ? ?Radiology Studies: ?No results found. ? ? ? LOS: 58 days  ? ? ?Flora Lipps, MD ?[facility] ?05/27/2021, 8:58 AM  ?  ?

## 2021-05-28 LAB — CBC
HCT: 37.5 % — ABNORMAL LOW (ref 39.0–52.0)
Hemoglobin: 12.3 g/dL — ABNORMAL LOW (ref 13.0–17.0)
MCH: 31.3 pg (ref 26.0–34.0)
MCHC: 32.8 g/dL (ref 30.0–36.0)
MCV: 95.4 fL (ref 80.0–100.0)
Platelets: 266 10*3/uL (ref 150–400)
RBC: 3.93 MIL/uL — ABNORMAL LOW (ref 4.22–5.81)
RDW: 14.7 % (ref 11.5–15.5)
WBC: 9.5 10*3/uL (ref 4.0–10.5)
nRBC: 0 % (ref 0.0–0.2)

## 2021-05-28 NOTE — Progress Notes (Signed)
?PROGRESS NOTE ? ? ? ?Billy Cherry  MRN:[id number] DOB: 05/24/1970 DOA: 03/30/2021 ?PCP: Patient, No Pcp Per (Inactive)  ? ? ?Brief Narrative:  ?51 years old male with past medical history of hypertension was brought into the hospital after being found unresponsive by his wife.  EMS was called in and patient was noted to be in ventricular fibrillation with agonal respirations.  Patient was given CPR, epinephrine and had return of spontaneous circulation.  EKG showed anterolateral ST elevation and was taken to Cath Lab. Dr Parachos performed cath> insignificant CAD and EF 45-50%. Patient remained on ventilator after cath and was admitted to the ICU. Underwent TTM.  Neurology was consulted due to rhythmic twitching of his lips tongue and was started on Keppra.  MRI of the brain showed evidence of hypoxia with bilateral ACA MCA watershed infarcts.  2D echocardiogram showed LV ejection fraction of 20% with global hypokinesis.  Patient was also treated for ventilator associated pneumonia.  Currently status post tracheostomy and PEG tube placement.  Patient did have a spike of fever on 2/28 thought to be secondary to aspiration and was on Fortaz since respiratory culture was growing Pseudomonas.  Blood culture however grew out MRSE in 3 out of 3 bottles.  Infectious disease was consulted ,repeat blood cultures were sent and patient is being observed off antibiotic.  Patient persisted to have fever during hospitalization so infectious disease was reconsulted.  Patient underwent ultrasound of the lower extremities which showed DVT on the left lower extremity.  Eliquis was started 05/15/2021.  Patient continues to have low-grade fever while in the hospital.  Pulmonary following the patient for tracheostomy management  ? ?  ?Assessment and Plan: ?* Cardiac arrest ([name]) ?Postarrest anoxic encephalopathy ?Postarrest respiratory failure s/p TC ?S/P PEG tube placement ?Postarrest myoclonus: ?2D echocardiogram showed reduced LV  function at 20%.  Now status post trach and PEG.  Remains obtunded with anoxic encephalopathy-on Keppra, for myoclonus and can increase Klonopin for breakthrough myoclonus.  At this time, plan is home with home health-Home health/patient's wife and family will be taking turns to take care of him he will need around-the-clock care. Enteral tube feeding trach kit and supplies to be  Arranged. PCCM managing tracheostomy. ? ?Anoxic encephalopathy ([name]) ?Continue supportive care.  Patient is not responsive.  No clinical improvement noted. ? ?Acute respiratory failure ([name]) ?Status post trach collar.  Continue supportive care ? ?RLL pneumonia ?With Sepsis Previously had completed vancomycin for MSSA and [name].  Received more than 7-day course of IV Fortaz. ? ?Fever ? Low-grade fever.Temperature max of 99.7.?F.  .  Seen by  with Infectious disasese due to persistent fever.  No recommendation for antibiotic and further work-up.  Lower extremity ultrasound showed indeterminate DVT.  CBC without a leukocytosis. ? ?DVT (deep venous thrombosis) ([name]) ?Duplex ultrasound of lower extremity on 05/15/2021 showed age indeterminate deep vein thrombosis involving the left femoral vein, left proximal profunda vein, left posterior tibial veins, left popliteal vein, and left peroneal veins.  On eliquis rating.  Without any bleeding. ? ?Dysphagia ?Continue PEG tube feeding.  Tolerating. ? 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 Subcutaneous fat may be visible but bone, tendon or muscle are NOT exposed. Pink, red (Active)  ?05/24/21 2250  ?Location: Penis  ?Location Orientation: Lateral  ?Staging: Stage 3 -  Full thickness tissue loss. Subcutaneous fat may be visible but bone, tendon or muscle are NOT exposed.  ?Wound   Description (Comments): Pink, red  ?Present on Admission: No  ? ?Continue wound care ? ?Obesity, Class I, BMI 30-34.9 ?BMI 33.  Supportive  care. ? ?HTN (hypertension) ?Continue Coreg, Isordil, amlodipine, Cardura, hydralazine and PRN Labetalol. Dose of hydralazine to 100 mg 3 times daily from 05/13/21.  ? ?HFrEF (heart failure with reduced ejection fraction) ([name]) ?2D echocardiogram-LVEF less than 20%, continue coreg, imdur, amlodipine, doxazosin, hydralazine, labetalol as needed, patient is negative balance for 19565 mL. ? ?Hypernatremia ?Was trending towards hyponatremia so water flushes has been decreased.  Latest sodium of 136. ? ?Myoclonus ?Seen by neurology.  Continue current AED with Klonopin, Keppra. per neuro notes, suppressing his myoclonus will not ultimately change the prognosis  ? ?Nutrition through the PEG tube.  Continue water flushes. ? ? ? DVT prophylaxis:  ?apixaban (ELIQUIS) tablet 5 mg  ? ?Code Status:   ?  Code Status: Full Code ? ?Disposition:  uncertain disposition.  Likely home with home health. ? ?Status is: Inpatient ? ?Remains inpatient appropriate because: Need for tracheostomy and PEG tube care, pending Medicaid, low-grade fever, pending disposition plan. ? ? Family Communication:  ?None today.   ? ?Consultants:  ?Palliative,  ?ENT,  ?GI,  ?neuro,  ?cardiology,  ?PCCM,  ?Infectious disease ? ?Procedures:  ?IR guided gastrostomy tube placement on 04/26/2021 ?Endotracheal tube placement mechanical ventilation. ?Tracheostomy  ? ?Antimicrobials:  ?Fortaz 05/02/2021>3/8 ?Vancomycin 05/08/21>05/09/21 ? ?Subjective: ? ?Today, patient was seen and examined at bedside.  Nonverbal.  No interval complaints reported.   ?Objective: ?Vitals:  ? 05/28/21 0400 05/28/21 0410 05/28/21 0755 05/28/21 0903  ?BP:    (!) 136/102  ?Pulse:   97 96  ?Resp: (!) 24  (!) 26 (!) 25  ?Temp: 99.7 ?F (37.6 ?C)   99.2 ?F (37.3 ?C)  ?TempSrc: Oral   Oral  ?SpO2:  97% 98% 97%  ?Weight:  94 kg    ?Height:      ? ? ?Intake/Output Summary (Last 24 hours) at 05/28/2021 1016 ?Last data filed at 05/28/2021 0906 ?Gross per 24 hour  ?Intake --  ?Output 2850 ml  ?Net -2850 ml   ? ? ?Filed Weights  ? 05/26/21 0041 05/27/21 0422 05/28/21 0410  ?Weight: 96 kg 97 kg 94 kg  ? ?Body mass index is 29.73 kg/m?.  ? ?Physical Examination: ? ?General: Obese built not in obvious distress, chronically ill and deconditioned, ?HENT: Tracheostomy collar in place ?Chest:  Clear breath sounds.  Diminished breath sounds bilaterally. No crackles or wheezes.  ?CVS: S1 &S2 heard. No murmur.  Regular rate and rhythm. ?Abdomen: Soft, nontender, nondistended.  Bowel sounds are heard.  PEG tube in place ?Extremities: No cyanosis, clubbing or edema.  Peripheral pulses are palpable. ?Psych: Nonresponsive, ?CNS: Nonverbal, spontaneous eye opening, flaccid extremities, no meaningful response to stimuli. ?Skin: Warm and dry.  No rashes noted. ? ? ?CBC: ?Recent Labs  ?Lab 05/24/21 ?0349 05/28/21 ?0304  ?WBC 8.5 9.5  ?HGB 10.4* 12.3*  ?HCT 33.3* 37.5*  ?MCV 96.5 95.4  ?PLT 278 266  ? ? ? ?Basic Metabolic Panel: ?Recent Labs  ?Lab 05/24/21 ?0349 05/27/21 ?0212  ?NA 135 136  ?K 4.4 4.5  ?CL 97* 99  ?CO2 29 28  ?GLUCOSE 95 102*  ?BUN 27* 20  ?CREATININE 0.75 0.62  ?CALCIUM 8.5* 8.7*  ?MG 2.1  --   ? ? ? ?Liver Function Tests: ?No results for input(s): AST, ALT, ALKPHOS, BILITOT, PROT, ALBUMIN in the last 168 hours. ? ? ? ?Radiology Studies: ?No results found. ? ? ?   LOS: 59 days  ? ? ?Flora Lipps, MD ?[facility] ?05/28/2021, 10:16 AM  ?  ?

## 2021-05-29 NOTE — Progress Notes (Addendum)
?NAME:  Billy Cherry, MRN:  [id number], DOB:  1970-10-20, LOS: 60 ?ADMISSION DATE:  03/30/2021, CONSULTATION DATE:  1/19 ?REFERRING MD:  Parashos, CHIEF COMPLAINT:  Found down, cardiac arrest  ? ?Brief Pt Description / Synopsis:  ?51 year old male with out-of-hospital V. fib cardiac arrest in setting of respiratory arrest due to suspected angioedema from ACE inhibitor leading to asphyxiation.  Concern on his 12 lead EKG for anterolateral ST elevation so he was taken to the cath lab where his study showed no significant coronary artery disease and an LVEF < 20%  Now with anoxic brain injury. ? ?Pertinent  Medical History  ?Hypertension ? ?Significant Hospital Events: ?Including procedures, antibiotic start and stop dates in addition to other pertinent events   ?1/19 admission, left heart cath without significant CAD.  Concern for anoxic brain injury ?1/20: MRI pending, Neuro following ?1/21: MRI shows areas of hypoxic-ischemic injury bilaterally. Fevered; cultures obtained. ?1/23 severe brain damage ?1/24 evidence of severe brain damage, remains critically ill ?1/25: Plan for PEG today. Neuro exam unchanged.  Does exhibit new eyelid tremor like movements and lip/jaw tremor like vs clonus movement.  Bolused Keppra and increased maintenance dose.  Repeat EEG. CTH: concerning for progression of anoxic injury.  ?1/26: Repeat EEG unchanged and continues to show myoclonic seizures despite addition of Valproic acid yesterday. transfer to [facility] for continuous EEG ?1/28: Continues to have myoclonic sz on EEG ?1/29 No acute issues overnight ?2/3 no meaningful neuro recovery. Diuresed ?2/5 unchanged ?2/6 New fever to 102, WBC 16K - treated with vancomycin for 7d based on cultures.  ?2/11: percutaneous tracheostomy ?2/13 >2/20 on trach collar. No vent.  ?2/20 Trach change to 6 cuffless ?2/27 status unchanged ?3/13 unchanged ?3/20 due for trach exchange given continue secretions (although improved) will stick to 6 cuffless  for exchange  ? ?Interim History / Subjective:  ? ?No change in status. Continues on trach collar with secretions improving since last week. ? ?Objective   ?Blood pressure (!) 146/109, pulse 97, temperature 98.8 ?F (37.1 ?C), temperature source Oral, resp. rate (!) 25, height 5\' 10"  (1.778 m), weight 95 kg, SpO2 98 %. ?   ?FiO2 (%):  [28 %] 28 %  ? ?Intake/Output Summary (Last 24 hours) at 05/29/2021 1252 ?Last data filed at 05/29/2021 1207 ?Gross per 24 hour  ?Intake 400 ml  ?Output 2250 ml  ?Net -1850 ml  ? ? ?Filed Weights  ? 05/27/21 0422 05/28/21 0410 05/29/21 0400  ?Weight: 97 kg 94 kg 95 kg  ? ?Examination:  ?Gen:      No acute distress ?HEENT:  EOMI, sclera anicteric ?Neck:     No masses; no thyromegaly ?Lungs:    Clear to auscultation bilaterally; normal respiratory effort ?CV:         Regular rate and rhythm; no murmurs ?Abd:      + bowel sounds; soft, non-tender; no palpable masses, no distension ?Ext:    No edema; adequate peripheral perfusion ?Skin:      Warm and dry; no rash ?Neuro: Open eyes. Does not follow commands ? ?Ancillary tests personally reviewed:    ? ?Assessment & Plan:  ?OOH Vfib arrest ?Post arrest anoxic brain injury, severe ?Post arrest myoclonus improved with klonopin, no AEDs recommended ?Post arrest respiratory failure with prolonged mechanical ventilation s/p tracheostomy ?Post arrest cardiomyopathy ?Post arrest AKI, improved/stable ?HTN- stable on current regimen ?HCAP w/ MSSA- ABX > 2/21 ?Likely GPC bacteremia ? ? ?Plan:  ?Continue routine trach care ?Maintain size 6 cuffless ?  Routine trach care ?SLP was following for effort for PMV trials but patient remains non-verbal and team has signed off  ? ? ?PCCM will continue to follow weekly for trach care, please call if needed.  ? ? ?Best Practice (right click and "Reselect all SmartList Selections" daily)  ?Per primary team ? ?Marshell Garfinkel MD ?[facility] ?See Amion for pager ? ?If no response to pager , please  call [phone] until 7pm ?After 7:00 pm call Elink  [phone] ?05/29/2021, 12:52 PM  ? ? ? ? ? ? ? ? ? ? ? ? ? ? ? ? ? ? ?

## 2021-05-29 NOTE — Progress Notes (Signed)
?PROGRESS NOTE ? ? ? ?Spero Gunnels  KDX:[id number] DOB: 27-Jan-1971 DOA: 03/30/2021 ?PCP: Patient, No Pcp Per (Inactive)  ? ? ?Brief Narrative:  ?51 years old male with past medical history of hypertension was brought into the hospital after being found unresponsive by his wife.  EMS was called in and patient was noted to be in ventricular fibrillation with agonal respirations.  Patient was given CPR, epinephrine and had return of spontaneous circulation.  EKG showed anterolateral ST elevation and was taken to Cath Lab. Dr Josefa Half performed cath> insignificant CAD and EF 45-50%. Patient remained on ventilator after cath and was admitted to the ICU. Underwent TTM.  Neurology was consulted due to rhythmic twitching of his lips tongue and was started on Keppra.  MRI of the brain showed evidence of hypoxia with bilateral ACA MCA watershed infarcts.  2D echocardiogram showed LV ejection fraction of 20% with global hypokinesis.  Patient was also treated for ventilator associated pneumonia.  Currently status post tracheostomy and PEG tube placement.  Patient did have a spike of fever on 2/28 thought to be secondary to aspiration and was on Fortaz since respiratory culture was growing Pseudomonas.  Blood culture however grew out MRSE in 3 out of 3 bottles.  Infectious disease was consulted ,repeat blood cultures were sent and patient is being observed off antibiotic.  Patient persisted to have fever during hospitalization so infectious disease was reconsulted.  Patient underwent ultrasound of the lower extremities which showed DVT on the left lower extremity.  Eliquis was started 05/15/2021.  Pulmonary following the patient for tracheostomy management  ? ?  ?Assessment and Plan: ?* Cardiac arrest [facility]) ?Postarrest anoxic encephalopathy ?Postarrest respiratory failure s/p TC ?S/P PEG tube placement ?Postarrest myoclonus: ?2D echocardiogram showed reduced LV function at 20%.  Now status post trach and PEG.  Remains obtunded  with anoxic encephalopathy-on Keppra, for myoclonus and can increase Klonopin for breakthrough myoclonus.  At this time, plan is home with home health-Home health/patient's wife and family will be taking turns to take care of him he will need around-the-clock care. Enteral tube feeding trach kit and supplies to be  arranged. PCCM managing tracheostomy. ? ?Anoxic encephalopathy ([place]) ?Continue supportive care.  Patient is not responsive.  No clinical improvement noted. ? ?Acute respiratory failure ([place]) ?Status post trach collar.  Continue supportive care ? ?RLL pneumonia ?With Sepsis Previously been treated. ? ?Fever ? Low-grade fever.Temperature max of 99.7.?F.    Seen by  with Infectious disasese due to persistent fever during hospitalization.  No recommendation for antibiotic and further work-up.  Lower extremity ultrasound showed indeterminate DVT.  CBC without  leukocytosis. ? ?DVT (deep venous thrombosis) ([place]) ?Duplex ultrasound of lower extremity on 05/15/2021 showed age indeterminate deep vein thrombosis involving the left femoral vein, left proximal profunda vein, left posterior tibial veins, left popliteal vein, and left peroneal veins.  On eliquis tolerating without any bleeding. ? ?Dysphagia ?Continue PEG tube feeding.  Tolerating. ? 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 Subcutaneous fat may be visible but bone, tendon or muscle are NOT exposed. Pink, red (Active)  ?05/24/21 2250  ?Location: Penis  ?Location Orientation: Lateral  ?Staging: Stage 3 -  Full thickness tissue loss. Subcutaneous fat may be visible but bone, tendon or muscle are NOT exposed.  ?Wound Description (Comments): Pink, red  ?Present on Admission: No  ? ?Continue wound care ? ?Obesity, Class I, BMI 30-34.9 ?BMI 33.  Supportive care. ? ?  HTN (hypertension) ?Continue Coreg, Isordil, amlodipine, Cardura, hydralazine and PRN Labetalol. On increased dose of  hydralazine. ? ?HFrEF (heart failure with reduced ejection fraction) ([place]) ?2D echocardiogram-LVEF less than 20%, continue coreg, imdur, amlodipine, doxazosin, hydralazine, labetalol as needed, patient is negative balance for 22260 ml ? ?Hypernatremia ?Improved, water flushed decreased.  Latest sodium of 136. ? ?Myoclonus ?Seen by neurology.  Continue current AED with Klonopin, Keppra. per neuro notes, suppressing his myoclonus will not ultimately change the prognosis  ? ?Nutrition through the PEG tube.  Continue water flushes. ? ? ? DVT prophylaxis:  ?apixaban (ELIQUIS) tablet 5 mg  ? ?Code Status:   ?  Code Status: Full Code ? ?Disposition:  uncertain disposition.  Likely home with home health. ? ?Status is: Inpatient ? ?Remains inpatient appropriate because: Need for tracheostomy and PEG tube care, pending disposition plan. ? ? Family Communication:  ?No one at bedside ? ?Consultants:  ?Palliative,  ?ENT,  ?GI,  ?neuro,  ?cardiology,  ?PCCM,  ?Infectious disease ? ?Procedures:  ?IR guided gastrostomy tube placement on 04/26/2021 ?Endotracheal tube placement mechanical ventilation. ?Tracheostomy  ? ?Antimicrobials:  ?Tressie Ellis 05/02/2021>3/8 ?Vancomycin 05/08/21>05/09/21 ? ?Subjective: ? ?Today, patient was seen and examined at bedside.  Nonverbal.  No interval complaints reported.  No clinical improvement reported as well. ?  ?Objective: ?Vitals:  ? 05/29/21 0328 05/29/21 0400 05/29/21 0723 05/29/21 0807  ?BP: (!) 118/96 (!) 141/83 (!) 151/117 (!) 146/109  ?Pulse: 99 87 90 94  ?Resp: _0 ?Temp:  98.6 ?F (37 ?C) 98.8 ?F (37.1 ?C)   ?TempSrc:  Oral Oral   ?SpO2: 100% 99% 96% 96%  ?Weight:  95 kg    ?Height:      ? ? ?Intake/Output Summary (Last 24 hours) at 05/29/2021 0930 ?Last data filed at 05/29/2021 4967 ?Gross per 24 hour  ?Intake 955 ml  ?Output 1400 ml  ?Net -445 ml  ? ? ?Filed Weights  ? 05/27/21 0422 05/28/21 0410 05/29/21 0400  ?Weight: 97 kg 94 kg 95 kg  ? ?Body mass index is 30.05 kg/m?.  ? ?Physical  Examination: ? ?General: Obese built, not in obvious distress, chronically ill and deconditioned ?HENT:   No scleral pallor or icterus noted. Tracheostomy collar in place. ?Chest: Breath sounds bilaterally ?CVS: S1 &S2 heard. No murmur.  Regular rate and rhythm. ?Abdomen: Soft, Bowel sounds are heard.  PEG tube in place. ?Extremities: No cyanosis, clubbing or edema.  Peripheral pulses are palpable.  Flaccid extremities ?Psych: Nonverbal. ?CNS: Spontaneous eye opening, flaccid extremities, no meaningful response to stimuli. ?Skin: Warm and dry.   ? ?CBC: ?Recent Labs  ?Lab 05/24/21 ?5916 05/28/21 ?0304  ?WBC 8.5 9.5  ?HGB 10.4* 12.3*  ?HCT 33.3* 37.5*  ?MCV 96.5 95.4  ?PLT 278 266  ? ? ? ?Basic Metabolic Panel: ?Recent Labs  ?Lab 05/24/21 ?3846 05/27/21 ?6599  ?NA 135 136  ?K 4.4 4.5  ?CL 97* 99  ?CO2 29 28  ?GLUCOSE 95 102*  ?BUN 27* 20  ?CREATININE 0.75 0.62  ?CALCIUM 8.5* 8.7*  ?MG 2.1  --   ? ? ? ?Liver Function Tests: ?No results for input(s): AST, ALT, ALKPHOS, BILITOT, PROT, ALBUMIN in the last 168 hours. ? ? ? ?Radiology Studies: ?No results found. ? ? ? LOS: 60 days  ? ? ?Flora Lipps, MD ?[facility] ?05/29/2021, 9:30 AM  ?  ?

## 2021-05-29 NOTE — Progress Notes (Signed)
Wife taught how to feed her husband via PEG tube. Also taught the amt of water and feedings for the pt. The wife verbalized understanding.  Demonstrated the procedure with accuracy.  She said she will be back in the morning to help with the morning feeds ?

## 2021-05-29 NOTE — Progress Notes (Signed)
?   05/29/21 1134  ?Vitals  ?BP (!) 146/109  ?Pulse Rate 97  ?ECG Heart Rate 97  ?Resp (!) 25  ?Level of Consciousness  ?Level of Consciousness Alert  ?MEWS COLOR  ?MEWS Score Color Green  ?MEWS Score  ?MEWS Temp 0  ?MEWS Systolic 0  ?MEWS Pulse 0  ?MEWS RR 1  ?MEWS LOC 0  ?MEWS Score 1  ? ?Patient was being turned and cleaned up after a bowel movement. Nurse and NT Otila Kluver were providing patient care. Also, patient was given mouth care by nurse at this time frame as well. ?

## 2021-05-29 NOTE — Progress Notes (Signed)
Pt was turned onto left side with assistance by Inetta Fermo NT. Pt has thick secretions and was suctioned x 2. Had BM and pericare was provided and sacral dressing was changed.  BP is 126/83 (86). ?

## 2021-05-30 MED ORDER — JUVEN PO PACK
1.0000 | PACK | Freq: Two times a day (BID) | ORAL | Status: DC
  Administered 2021-05-30 – 2021-06-01 (×4): 1 via ORAL
  Filled 2021-05-30 (×5): qty 1

## 2021-05-30 NOTE — Progress Notes (Signed)
Nutrition Follow-up ? ?DOCUMENTATION CODES:  ? ?Not applicable ? ?INTERVENTION:  ? ?Continue bolus Tube Feeds via PEG: ?355 mL Osmolite 1.5 - QID (1 1/2 cartons/bolus)  ?45 mL ProSource TF - BID ?Adjust free water 200 mL free water q4h  ?Provides 2210 kcal, 111 gm PRO, and 1086 mL free water (2286 mL total free water) daily.  ?Juven BID, each packet provides 95 calories, 2.5 grams of protein (collagen), and 9.8 grams of carbohydrate (3 grams sugar); also contains 7 grams of L-arginine and L-glutamine, 300 mg vitamin C, 15 mg vitamin E, 1.2 mcg vitamin B-12, 9.5 mg zinc, 200 mg calcium, and 1.5 g  Calcium Beta-hydroxy-Beta-methylbutyrate to support wound healing ? ?NUTRITION DIAGNOSIS:  ? ?Inadequate oral intake related to inability to eat as evidenced by NPO status. - Ongoing, being addressed via TF ? ?GOAL:  ? ?Patient will meet greater than or equal to 90% of their needs - Being addressed via TF ? ?MONITOR:  ? ?Labs, Skin, TF tolerance, Weight trends ? ?REASON FOR ASSESSMENT:  ? ?Ventilator, Consult ?Enteral/tube feeding initiation and management ? ?ASSESSMENT:  ? ?51 year old male who originally [facility] to [facility] on [facility]/19 after out-of-hospital cardiac arrest due to suspected angioedema from ACE inhibitor leading to asphyxiation. PMH of HTN, HLD. ? ?2/11 - tracheostomy placed ?2/22 - PEG placed  ? ?No family at bedside. ? ?RN reports no signs of intolerance to TF since switching formulas.  ? ?Per EMR, pt with no skin breakdown. Will start pt on Juven to help promote wound healing.  ? ?Medications reviewed and include: Fiber, Protonix, Spironolactone ?Labs reviewed. ? ?Diet Order:   ? ? None  ? ?EDUCATION NEEDS:  ? ?Not appropriate for education at this time ? ?Skin:  Skin Assessment: Skin Integrity Issues: ?Skin Integrity Issues:: Stage III, DTI ?DTI: R Foot & Heel ?Stage III: Penis ?Other: MASD Buttocks ? ?Last BM:  3/28 ? ?Height:  ? ?Ht Readings from Last 1 Encounters:  ?04/18/21 5\' 10"  (1.778 m)  ? ? ?Weight:   ? ?Wt Readings from Last 1 Encounters:  ?05/30/21 95.8 kg  ? ? ?Ideal Body Weight:  75.5 kg ? ?BMI:  Body mass index is 30.3 kg/m?. ? ?Estimated Nutritional Needs:  ? ?Kcal:  2200-2400 ? ?Protein:  110-125 grams ? ?Fluid:  >/= 2.2 L ? ? ?Hermina Barters RD, LDN ?Clinical Dietitian ?See AMiON for contact information.  ? ?

## 2021-05-30 NOTE — Progress Notes (Signed)
Patient wife should have came in this morning for feeding and trach teaching, but she has not show up, up to the time of this note. ?

## 2021-05-30 NOTE — Progress Notes (Signed)
Dsg on both heels changed after wasing them in soap and water. ?

## 2021-05-30 NOTE — TOC Progression Note (Signed)
Transition of Care (TOC) - Progression Note  ? ? ?Patient Details  ?Name: Billy Cherry ?MRN: [id number] ?Date of Birth: 01/21/1971 ? ?Transition of Care (TOC) CM/SW Contact  ?Leone Haven, RN ?Phone Number: ?05/30/2021, 10:18 AM ? ?Clinical Narrative:    ?Per staff Nurse, wife is supposed to come in this am to get teaching on the bolus feeds.  Also NCM left message with Meriam Sprague the financial counselor to see if his Medicaid has come thru yet, awaiting call back.  ? ? ?Expected Discharge Plan: Home w Home Health Services ?Barriers to Discharge: Barriers Resolved ? ?Expected Discharge Plan and Services ?Expected Discharge Plan: Home w Home Health Services ?In-house Referral: Hospice / Palliative Care ?Discharge Planning Services: CM Consult ?Post Acute Care Choice: Long Term Acute Care (LTAC) ?Living arrangements for the past 2 months: Single Family Home ?                ?DME Arranged: N/A ?DME Agency: [employer] ?  ?  ?  ?HH Arranged: Nurse's Aide ?HH Agency: [facility] ?  ?  ?Representative spoke with at [facility] Agency: [name] ? ? ?Social Determinants of Health (SDOH) Interventions ?  ? ?Readmission Risk Interventions ? ?  04/14/2021  ? 11:14 AM  ?Readmission Risk Prevention Plan  ?Transportation Screening Complete  ?HRI or Home Care Consult Complete  ?Social Work Consult for Recovery Care Planning/Counseling Complete  ?Palliative Care Screening Complete  ?Medication Review Oceanographer) Complete  ? ? ?

## 2021-05-30 NOTE — Progress Notes (Signed)
Spouse was in the room at this time. Encouraged her to touch her husband's PEG tube and the equipment similar to what she would be using. Informed her of the amounts and how to schedule her feedings. Oncoming nurse educated her as well during shift change. Tonight patient is to work with night shift nurse on feedings and water flushes. Again encouraged patient to do trach care as well. ?

## 2021-05-30 NOTE — Progress Notes (Signed)
Spouse Retillas Colette Ribas called to inquire about status of husband. I informed her that she needed to come to the hospital to get  hands on with bolus feedings and free water flushes. I educated her over the telephone that this was his nutrition and that she needed to gain understanding on the importance of this. ? ?Education was provided about the number of bolus feeds (4) per day at 355 ML and free water flushes of 200 ML every 4 hours. Educated patient on the importance of hands on care and teaching. ?

## 2021-05-30 NOTE — Progress Notes (Signed)
?PROGRESS NOTE ? ? ? ?Billy Cherry  VEL:[id number] DOB: 09/17/1970 DOA: 03/30/2021 ?PCP: Patient, No Pcp Per (Inactive)  ? ? ?Brief Narrative:  ?51 years old male with past medical history of hypertension was brought into the hospital after being found unresponsive by his wife.  EMS was called in and patient was noted to be in ventricular fibrillation with agonal respirations.  Patient was given CPR, epinephrine and had return of spontaneous circulation.  EKG showed anterolateral ST elevation and was taken to Cath Lab. Dr Josefa Half performed cath> insignificant CAD and EF 45-50%. Patient remained on ventilator after cath and was admitted to the ICU. Underwent TTM.  Neurology was consulted due to rhythmic twitching of his lips tongue and was started on Keppra.  MRI of the brain showed evidence of hypoxia with bilateral ACA MCA watershed infarcts.  2D echocardiogram showed LV ejection fraction of 20% with global hypokinesis.  Patient was also treated for ventilator associated pneumonia.  Currently status post tracheostomy and PEG tube placement.  Patient did have a spike of fever on 2/28 thought to be secondary to aspiration and was on Fortaz since respiratory culture was growing Pseudomonas.  Blood culture however grew out MRSE in 3 out of 3 bottles.  Infectious disease was consulted ,repeat blood cultures were sent and patient is being observed off antibiotic.  Patient persisted to have fever during hospitalization so infectious disease was reconsulted.  Patient underwent ultrasound of the lower extremities which showed DVT on the left lower extremity.  Eliquis was started 05/15/2021.  Pulmonary following the patient for tracheostomy management  ? ?  ?Assessment and Plan: ?* Cardiac arrest [facility]) ?Postarrest anoxic encephalopathy ?Postarrest respiratory failure s/p TC ?S/P PEG tube placement ?Postarrest myoclonus: ?2D echocardiogram showed reduced LV function at 20%. Status post trach and PEG.  Remains obtunded with  anoxic encephalopathy-on Keppra, for myoclonus and can increase Klonopin for breakthrough myoclonus.  At this time, plan is home with home health-Home health/patient's wife and family will be taking turns to take care of him he will need around-the-clock care. Enteral tube feeding trach kit and supplies to be  arranged. PCCM managing tracheostomy.  Family needs trach care PEG tube training and medical needs on discharge.  Wife has court date for guardianship.  TOC on board for disposition plan. ? 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  Patient is not responsive.  No clinical improvement noted.  Has remained obtunded ? ?Acute respiratory failure ([name]) ?Status post trach collar.  Continue supportive care ? ?RLL pneumonia ?With Sepsis Previously been treated. ? ?Fever ? Low-grade fever.Temperature max of 99.8.?F.    Seen by  with Infectious disasese due to persistent fever during hospitalization.  No recommendation for antibiotic and further work-up.  Lower extremity ultrasound showed indeterminate DVT.  CBC without  leukocytosis. ? ?DVT (deep venous thrombosis) ([name]) ?Duplex ultrasound of lower extremity on 05/15/2021 showed age indeterminate deep vein thrombosis involving the left femoral vein, left proximal profunda vein, left posterior tibial veins, left popliteal vein, and left peroneal veins.  On eliquis tolerating without any bleeding. ? ?Dysphagia ?Continue PEG tube feeding.  Tolerating tube feeds so far but ? 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 Subcutaneous fat may be visible but bone, tendon or muscle are NOT exposed. Pink, red (Active)  ?05/24/21 2250  ?Location: Penis  ?Location Orientation: Lateral  ?Staging: Stage 3 -  Full thickness tissue loss. Subcutaneous fat may be visible but bone,  tendon or muscle are NOT exposed.  ?Wound Description (Comments): Pink, red  ?Present on Admission: No  ? ?Continue wound  care ? ?Obesity, Class I, BMI 30-34.9 ?BMI 33.  Supportive care. ? ?HTN (hypertension) ?Continue Coreg, Isordil, amlodipine, Cardura, hydralazine and PRN Labetalol. On increased dose of hydralazine.  Blood pressure appears to be little more controlled with ? ?HFrEF (heart failure with reduced ejection fraction) ([place]) ?2D echocardiogram-LVEF less than 20%, continue coreg, imdur, amlodipine, doxazosin, hydralazine, labetalol as needed, patient is negative balance for 24035 ml ? ?Hypernatremia ?Improved, with subsequent hyponatremia.  Water flushed decreased.  Latest sodium of 136. ? ?Myoclonus ?Seen by neurology.  Continue current AED with Klonopin, Keppra. per neuro notes, suppressing his myoclonus will not ultimately change the prognosis  ? ?Nutrition through the PEG tube.  Continue water flushes. ? ? ? DVT prophylaxis:  ?apixaban (ELIQUIS) tablet 5 mg  ? ?Code Status:   ?  Code Status: Full Code ? ?Disposition:  uncertain disposition.  Likely home with home health.  TOC on board regarding disposition. ? ?Status is: Inpatient ? ?Remains inpatient appropriate because: Need for tracheostomy and PEG tube care, pending disposition plan. ? ? Family Communication:  ?No one at bedside ? ?Consultants:  ?Palliative,  ?ENT,  ?GI,  ?neuro,  ?cardiology,  ?PCCM,  ?Infectious disease ? ?Procedures:  ?IR guided gastrostomy tube placement on 04/26/2021 ?Endotracheal tube placement mechanical ventilation. ?Tracheostomy  ? ?Antimicrobials:  ?Tressie Ellis 05/02/2021>3/8 ?Vancomycin 05/08/21>05/09/21 ? ?Subjective: ? ?Today, patient was seen and examined at bedside.  No interval complaints reported.  No clinical improvement ? ?Objective: ?Vitals:  ? 05/30/21 0400 05/30/21 0414 05/30/21 0741 05/30/21 0812  ?BP: (!) 140/101 140/86 127/88 130/89  ?Pulse: 97 94 94 98  ?Resp: 18 18 (!) 24 (!) 23  ?Temp: 98.6 ?F (37 ?C) 98.6 ?F (37 ?C) 99.8 ?F (37.7 ?C)   ?TempSrc: Axillary  Oral   ?SpO2: 98%  96% 95%  ?Weight: 95.8 kg     ?Height:       ? ? ?Intake/Output Summary (Last 24 hours) at 05/30/2021 1002 ?Last data filed at 05/30/2021 0300 ?Gross per 24 hour  ?Intake --  ?Output 1775 ml  ?Net -1775 ml  ? ? ?Filed Weights  ? 05/28/21 0410 05/29/21 0400 05/30/21 0400  ?Weight: 94 kg 95 kg 95.8 kg  ? ?Body mass index is 30.3 kg/m?.  ? ?Physical Examination: ? ?General: Obese built, not in obvious distress, chronically ill and deconditioned ?HENT:   No scleral pallor or icterus noted. Oral mucosa is moist.  Tracheostomy in place. ?Chest:  Clear breath sounds.  Diminished breath sounds bilaterally. ?CVS: S1 &S2 heard. No murmur.  Regular rate and rhythm. ?Abdomen: Soft, nontender, nondistended.  Bowel sounds are heard.  PEG tube without obvious induration  ?Extremities: No cyanosis, clubbing flaccid extremities ?Psych: Nonverbal. ?CNS: Spontaneous eye-opening, flaccid extremities, no meaningful response to stimuli. ?Skin: Warm and dry.  No rashes noted. ? ?CBC: ?Recent Labs  ?Lab 05/24/21 ?2774 05/28/21 ?0304  ?WBC 8.5 9.5  ?HGB 10.4* 12.3*  ?HCT 33.3* 37.5*  ?MCV 96.5 95.4  ?PLT 278 266  ? ? ? ?Basic Metabolic Panel: ?Recent Labs  ?Lab 05/24/21 ?1287 05/27/21 ?8676  ?NA 135 136  ?K 4.4 4.5  ?CL 97* 99  ?CO2 29 28  ?GLUCOSE 95 102*  ?BUN 27* 20  ?CREATININE 0.75 0.62  ?CALCIUM 8.5* 8.7*  ?MG 2.1  --   ? ? ? ?Liver Function Tests: ?No results for input(s): AST, ALT, ALKPHOS, BILITOT,  PROT, ALBUMIN in the last 168 hours. ? ? ? ?Radiology Studies: ?No results found. ? ? ? LOS: 61 days  ? ? ?Flora Lipps, MD ?[facility] ?05/30/2021, 10:02 AM  ?  ?

## 2021-05-31 ENCOUNTER — Inpatient Hospital Stay (HOSPITAL_COMMUNITY): Payer: BC Managed Care – PPO

## 2021-05-31 LAB — CBC
HCT: 34.9 % — ABNORMAL LOW (ref 39.0–52.0)
Hemoglobin: 11.3 g/dL — ABNORMAL LOW (ref 13.0–17.0)
MCH: 30.7 pg (ref 26.0–34.0)
MCHC: 32.4 g/dL (ref 30.0–36.0)
MCV: 94.8 fL (ref 80.0–100.0)
Platelets: 231 10*3/uL (ref 150–400)
RBC: 3.68 MIL/uL — ABNORMAL LOW (ref 4.22–5.81)
RDW: 14.9 % (ref 11.5–15.5)
WBC: 9.6 10*3/uL (ref 4.0–10.5)
nRBC: 0 % (ref 0.0–0.2)

## 2021-05-31 LAB — BASIC METABOLIC PANEL
Anion gap: 10 (ref 5–15)
BUN: 25 mg/dL — ABNORMAL HIGH (ref 6–20)
CO2: 29 mmol/L (ref 22–32)
Calcium: 8.7 mg/dL — ABNORMAL LOW (ref 8.9–10.3)
Chloride: 94 mmol/L — ABNORMAL LOW (ref 98–111)
Creatinine, Ser: 0.71 mg/dL (ref 0.61–1.24)
GFR, Estimated: >60 mL/min (ref >60)
Glucose, Bld: 103 mg/dL — ABNORMAL HIGH (ref 70–99)
Potassium: 4.6 mmol/L (ref 3.5–5.1)
Sodium: 133 mmol/L — ABNORMAL LOW (ref 135–145)

## 2021-05-31 NOTE — Consult Note (Signed)
WOC Nurse wound follow up ?Wound type: ?DTPI right heel: stable; intact; blood filled; 4cm x 4cm x 0cm  ?DTPI right plantar surface: stable; intact; blood filled; 2cm x 4cm x 0cm ?Penile wound; full thickness ok to continue xerofom ?Measurement: see above  ?Wound bed:see above ?Drainage (amount, consistency, odor) none ?Periwound: dry skin on the bilateral heels and feet ?Dressing procedure/placement/frequency: ?Continue offloading with Prevalon boots ?Continue silicone foam to the plantar surface and heel. Continue xeroform to the penis ? ?WOC Nurse team will follow with you and see patient within 10 days for wound assessments.  Please notify WOC nurses of any acute changes in the wounds or any new areas of concern ?Sedra Morfin [facility] MSN, RN,CWOCN, CNS, CWON-AP ?[phone]  ? ? ?  ?

## 2021-05-31 NOTE — Progress Notes (Signed)
?Progress Note ? ? ?Patient: Billy Cherry [id number]:[id number] DOB: [birth year] DOA: 03/30/2021     62 ?DOS: the patient was seen and examined on 05/31/2021 ?  ?Brief hospital course: ?51 years old male with past medical history of hypertension was brought into the hospital after being found unresponsive by his wife.  EMS was called in and patient was noted to be in ventricular fibrillation with agonal respirations.  Patient was given CPR, epinephrine and had return of spontaneous circulation.  EKG showed anterolateral ST elevation and was taken to Cath Lab. Dr Josefa Half performed cath> insignificant CAD and EF 45-50%. Patient remained on ventilator after cath and was admitted to the ICU. Underwent TTM.  Neurology was consulted due to rhythmic twitching of his lips tongue and was started on Keppra.  MRI of the brain showed evidence of hypoxia with bilateral ACA MCA watershed infarcts.  2D echocardiogram showed LV ejection fraction of 20% with global hypokinesis.  Patient was also treated for ventilator associated pneumonia.  Currently status post tracheostomy and PEG tube placement.  Patient did have a spike of fever on 2/28 thought to be secondary to aspiration and was on Fortaz since respiratory culture was growing Pseudomonas.  Blood culture however grew out MRSE in 3 out of 3 bottles.  Infectious disease was consulted ,repeat blood cultures were sent and patient is being observed off antibiotic.  Patient persisted to have fever during hospitalization so infectious disease was reconsulted.  Patient underwent ultrasound of the lower extremities which showed DVT on the left lower extremity.  Eliquis was started 05/15/2021.  Patient continues to have low-grade fever while in the hospital.  Pulmonary following the patient for tracheostomy management ? ?Assessment and Plan: ?* Cardiac arrest [facility]) ?Postarrest anoxic encephalopathy ?Postarrest respiratory failure s/p TC ?S/P PEG tube placement ?Postarrest myoclonus: ?2D  echocardiogram showed reduced LV function at <20%.  Now status post trach and PEG.   ?Remains obtunded with anoxic encephalopathy-on Keppra, for myoclonus and can increase Klonopin for breakthrough myoclonus.   ?At this time, plan is home with home health-Home health/patient's wife and family will be taking turns to take care of him. ?He will need around-the-clock care.  ?Enteral tube feeding trach kit and supplies to be arranged. PCCM managing tracheostomy. ? 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  Patient is not responsive and has shown no improvement.  No interval changes ? ?Acute respiratory failure ([place]) ?Status post trach collar.  Continue supportive care ? ?RLL pneumonia ?With Sepsis Previously had completed vancomycin for MSSA and [name].  Received more than 7-day course of IV Fortaz. ? ? ?Fever ? Low-grade fever.Temperature max of 100.5.?F.  Continue respiratory care. Patient is at a very high risk of recurrent pneumonia.  Latest WBC at 9.8.Marland Kitchen Blood cultures from 05/09/2021 negative.  Staph epidermidis in the previous culture which was thought to be contaminant.  Seen by  with Infectious disasese due to persistent fever.  No recommendation for antibiotic and further work-up.  Lower extremity ultrasound showed indeterminate DVT.   ? ?Noted to have low grade fever, tachycardia and tachypnea today. Rechecking chest xray ? ?DVT (deep venous thrombosis) ([place]) ?Duplex ultrasound of lower extremity on 05/15/2021 showed age indeterminate deep vein thrombosis involving the left femoral vein, left proximal profunda vein, left posterior tibial veins, left popliteal vein, and left peroneal veins.  On eliquis ? ?Dysphagia ?Continue PEG tube feeding.  Tolerating. ? 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  Subcutaneous fat may be visible but bone, tendon or muscle are NOT exposed. Pink, red (Active)  ?05/24/21 2250  ?Location:  Penis  ?Location Orientation: Lateral  ?Staging: Stage 3 -  Full thickness tissue loss. Subcutaneous fat may be visible but bone, tendon or muscle are NOT exposed.  ?Wound Description (Comments): Pink, red  ?Present on Admission: No  ? ?Continue wound care ? ?Obesity, Class I, BMI 30-34.9 ?BMI 30.3.  Supportive care. ? ?HTN (hypertension) ?Continue Coreg, Isordil, amlodipine, Cardura, hydralazine and PRN Labetalol. Blood pressure stable ? ?HFrEF (heart failure with reduced ejection fraction) ([place]) ?2D echocardiogram-LVEF < 20%, continue coreg, imdur, amlodipine, doxazosin, hydralazine, labetalol as needed  ? ?Hypernatremia ?Was trending towards hyponatremia so water flushes has been decreased.   ? ?Myoclonus ?Seen by neurology.  Continue current AED with Klonopin, Keppra. Per neuro notes, suppressing his myoclonus will not ultimately change the prognosis  ? ? ? ? ?  ? ?Subjective: Patient is not responsive, noted to be warm to touch, increased resp rate and tachycardic ? ?Physical Exam: ?Vitals:  ? 05/31/21 1543 05/31/21 1600 05/31/21 1700 05/31/21 1709  ?BP:  (!) 134/96  111/75  ?Pulse: (!) 55 (!) 110 65 (!) 111  ?Resp: (!) 27 (!) 40 (!) 36 20  ?Temp: 99.7 ?F (37.6 ?C)   99.7 ?F (37.6 ?C)  ?TempSrc: Oral   Oral  ?SpO2: 96% 98% 97% 97%  ?Weight:      ?Height:      ? ?General exam: diaphoretic, does not respond ?Respiratory system: Clear to auscultation. Increased respiratory rate ?Cardiovascular system:tachycardic. No murmurs, rubs, gallops. ?Gastrointestinal system: Abdomen is nondistended, soft and nontender. No organomegaly or masses felt. Normal bowel sounds heard. ?Central nervous system: unable to assess due to mental status ?Extremities: No C/C/E, +pedal pulses ?Skin: No rashes, lesions or ulcers ?Psychiatry: nonverbal ? ?Data Reviewed: ? ?Reviewed cbc and chemistry. No significant lab values identified  ? ?Family Communication: no family present ? ?Disposition: ?Status is: Inpatient ?Remains inpatient  appropriate because: continued tachycardia/tachypnea ? Planned Discharge Destination: Home ? ? ? ?Time spent: 35 minutes ? ?Author: ?Kathie Dike, MD ?05/31/2021 5:56 PM ? ?For on call review [email or web address].  ?

## 2021-06-01 LAB — COMPREHENSIVE METABOLIC PANEL
ALT: 12 U/L (ref 0–44)
AST: 22 U/L (ref 15–41)
Albumin: 2 g/dL — ABNORMAL LOW (ref 3.5–5.0)
Alkaline Phosphatase: 57 U/L (ref 38–126)
Anion gap: 9 (ref 5–15)
BUN: 32 mg/dL — ABNORMAL HIGH (ref 6–20)
CO2: 28 mmol/L (ref 22–32)
Calcium: 8.6 mg/dL — ABNORMAL LOW (ref 8.9–10.3)
Chloride: 97 mmol/L — ABNORMAL LOW (ref 98–111)
Creatinine, Ser: 0.71 mg/dL (ref 0.61–1.24)
GFR, Estimated: >60 mL/min (ref >60)
Glucose, Bld: 100 mg/dL — ABNORMAL HIGH (ref 70–99)
Potassium: 4.8 mmol/L (ref 3.5–5.1)
Sodium: 134 mmol/L — ABNORMAL LOW (ref 135–145)
Total Bilirubin: 0.7 mg/dL (ref 0.3–1.2)
Total Protein: 6.1 g/dL — ABNORMAL LOW (ref 6.5–8.1)

## 2021-06-01 LAB — CBC
HCT: 36.2 % — ABNORMAL LOW (ref 39.0–52.0)
Hemoglobin: 11.9 g/dL — ABNORMAL LOW (ref 13.0–17.0)
MCH: 30.6 pg (ref 26.0–34.0)
MCHC: 32.9 g/dL (ref 30.0–36.0)
MCV: 93.1 fL (ref 80.0–100.0)
Platelets: 175 10*3/uL (ref 150–400)
RBC: 3.89 MIL/uL — ABNORMAL LOW (ref 4.22–5.81)
RDW: 15 % (ref 11.5–15.5)
WBC: 12.4 10*3/uL — ABNORMAL HIGH (ref 4.0–10.5)
nRBC: 0 % (ref 0.0–0.2)

## 2021-06-01 LAB — URINALYSIS, ROUTINE W REFLEX MICROSCOPIC
Bilirubin Urine: NEGATIVE
Glucose, UA: NEGATIVE mg/dL
Hgb urine dipstick: NEGATIVE
Ketones, ur: 5 mg/dL — AB
Leukocytes,Ua: NEGATIVE
Nitrite: NEGATIVE
Protein, ur: NEGATIVE mg/dL
Specific Gravity, Urine: 1.017 (ref 1.005–1.030)
pH: 6 (ref 5.0–8.0)

## 2021-06-01 MED ORDER — JUVEN PO PACK
1.0000 | PACK | Freq: Two times a day (BID) | ORAL | Status: DC
  Administered 2021-06-02 – 2021-09-01 (×178): 1
  Filled 2021-06-01 (×191): qty 1

## 2021-06-01 NOTE — Progress Notes (Signed)
Late entry  ?Patient wife at bedside on assessment , wife educated on importance of turning patient and patient Q2 hourly turning schedule.Wife along with this RN pulled up patient in bed and repositioned ?Wife asked to see respiratory therapist, respiratory called. However the therapist was not able to make to the unit before wife left.Wife states it was ok as she would be back tomorrow  06/01/2021 . ?

## 2021-06-01 NOTE — Progress Notes (Signed)
?Progress Note ? ? ?Patient: Billy Cherry HWE:[id number] DOB: 04/13/1970 DOA: 03/30/2021     63 ?DOS: the patient was seen and examined on 06/01/2021 ?  ?Brief hospital course: ?51 years old male with past medical history of hypertension was brought into the hospital after being found unresponsive by his wife.  EMS was called in and patient was noted to be in ventricular fibrillation with agonal respirations.  Patient was given CPR, epinephrine and had return of spontaneous circulation.  EKG showed anterolateral ST elevation and was taken to Cath Lab. Dr Josefa Half performed cath> insignificant CAD and EF 45-50%. Patient remained on ventilator after cath and was admitted to the ICU. Underwent TTM.  Neurology was consulted due to rhythmic twitching of his lips tongue and was started on Keppra.  MRI of the brain showed evidence of hypoxia with bilateral ACA MCA watershed infarcts.  2D echocardiogram showed LV ejection fraction of 20% with global hypokinesis.  Patient was also treated for ventilator associated pneumonia.  Currently status post tracheostomy and PEG tube placement.  Patient did have a spike of fever on 2/28 thought to be secondary to aspiration and was on Fortaz since respiratory culture was growing Pseudomonas.  Blood culture however grew out MRSE in 3 out of 3 bottles.  Infectious disease was consulted ,repeat blood cultures were sent and patient is being observed off antibiotic.  Patient persisted to have fever during hospitalization so infectious disease was reconsulted.  Patient underwent ultrasound of the lower extremities which showed DVT on the left lower extremity.  Eliquis was started 05/15/2021.  Patient continues to have low-grade fever while in the hospital.  Pulmonary following the patient for tracheostomy management ? ?Assessment and Plan: ?* Cardiac arrest [facility]) ?Postarrest anoxic encephalopathy ?Postarrest respiratory failure s/p TC ?S/P PEG tube placement ?Postarrest myoclonus: ?2D  echocardiogram showed reduced LV function at <20%.  Now status post trach and PEG.   ?Remains obtunded with anoxic encephalopathy-on Keppra, for myoclonus and can increase Klonopin for breakthrough myoclonus.   ?At this time, plan is home with home health-Home health/patient's wife and family will be taking turns to take care of him. ?He will need around-the-clock care.  ?Enteral tube feeding trach kit and supplies to be arranged. PCCM managing tracheostomy. ? 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  Patient is not responsive and has shown no improvement.  No interval changes ? ?Acute respiratory failure ([place]) ?Status post trach collar.  Continue supportive care ? ?RLL pneumonia ?With Sepsis Previously had completed vancomycin for MSSA and [name].  Received more than 7-day course of IV Fortaz. ? ? ?Fever ? Low-grade fever.Temperature max of 100.5.?F.  Continue respiratory care. Patient is at a very high risk of recurrent pneumonia.  Latest WBC at 9.8.Marland Kitchen Blood cultures from 05/09/2021 negative.  Staph epidermidis in the previous culture which was thought to be contaminant.  Seen by  with Infectious disasese due to persistent fever.  No recommendation for antibiotic and further work-up.  Lower extremity ultrasound showed indeterminate DVT.   ? ?-Continues to have persistent fevers of 100 degrees with associated tachycardia ?-Appears diaphoretic ?-Chest x-ray does not show any developing pneumonia ?-We will check urinalysis ? ?DVT (deep venous thrombosis) ([place]) ?Duplex ultrasound of lower extremity on 05/15/2021 showed age indeterminate deep vein thrombosis involving the left femoral vein, left proximal profunda vein, left posterior tibial veins, left popliteal vein, and left peroneal veins.  On eliquis ? ?Dysphagia ?Continue PEG tube feeding.  Tolerating. ? ?Pressure injury of skin ?Pressure  Injury 05/24/21 Penis Lateral Stage 3 -  Full thickness tissue loss. Subcutaneous fat may be  visible but bone, tendon or muscle are NOT exposed. Pink, red (Active)  ?05/24/21 2250  ?Location: Penis  ?Location Orientation: Lateral  ?Staging: Stage 3 -  Full thickness tissue loss. Subcutaneous fat may be visible but bone, tendon or muscle are NOT exposed.  ?Wound Description (Comments): Pink, red  ?Present on Admission: No  ? ?Continue wound care ? ?Obesity, Class I, BMI 30-34.9 ?BMI 30.3.  Supportive care. ? ?HTN (hypertension) ?Continue Coreg, Isordil, amlodipine, Cardura, hydralazine and PRN Labetalol. Blood pressure stable ? ?HFrEF (heart failure with reduced ejection fraction) ([place]) ?2D echocardiogram-LVEF < 20%, continue coreg, imdur, amlodipine, doxazosin, hydralazine, labetalol as needed  ? ?Hypernatremia ?Was trending towards hyponatremia so water flushes has been decreased.   ? ?Myoclonus ?Seen by neurology.  Continue current AED with Klonopin, Keppra. Per neuro notes, suppressing his myoclonus will not ultimately change the prognosis  ? ? ? ? ?  ? ?Subjective: Patient does open his eyes to voice, but does not make eye contact, engage in conversation or make any purposeful movements ? ?Physical Exam: ?Vitals:  ? 06/01/21 1200 06/01/21 1359 06/01/21 1528 06/01/21 1600  ?BP: 119/88   108/76  ?Pulse: 98 92 100 100  ?Resp: (!) 29  (!) 29 (!) 26  ?Temp: 99.9 ?F (37.7 ?C) 99.9 ?F (37.7 ?C)  99.5 ?F (37.5 ?C)  ?TempSrc: Axillary Axillary  Axillary  ?SpO2: 97%  100% 98%  ?Weight:      ?Height:      ? ?General exam: Unresponsive ?Respiratory system: Clear to auscultation. Respiratory effort normal. ?Cardiovascular system: Tachycardic. No murmurs, rubs, gallops. ?Gastrointestinal system: Abdomen is nondistended, soft and nontender. No organomegaly or masses felt. Normal bowel sounds heard. ?Central nervous system: Unable to assess ?Extremities: No C/C/E, +pedal pulses ?Skin: No rashes, lesions or ulcers ?Psychiatry: Unable to assess ? ?Data Reviewed: ? ?Review chest x-ray did not show any obvious pneumonia,  CBC and chemistry stable from yesterday ? ?Family Communication: tried calling wife to give update, got her voicemail. ? ?Disposition: ?Status is: Inpatient ?Remains inpatient appropriate because: Continued management of respiratory status, preparing family for discharge home ? Planned Discharge Destination: Home with Home Health ? ? ? ?Time spent: 35 minutes ? ?Author: ?Kathie Dike, MD ?06/01/2021 6:36 PM ? ?For on call review [email or web address].  ?

## 2021-06-01 NOTE — Progress Notes (Signed)
Patient seen today by trach team for consult. No education done at this time.  All the necessary equipment is at bedside.  Will continue to follow for progression.   ?

## 2021-06-01 NOTE — TOC Progression Note (Addendum)
Transition of Care (TOC) - Progression Note  ? ? ?Patient Details  ?Name: Billy Cherry ?MRN: [id number] ?Date of Birth: May 02, 1970 ? ?Transition of Care (TOC) CM/SW Contact  ?Leone Haven, RN ?Phone Number: ?06/01/2021, 2:15 PM ? ?Clinical Narrative:    ?Per [place] Diones in financial counseling - Mr. Ayars case worker from DSS emailed to say that she is reviewing his case and will have an update by Friday. Looking thru chart teaching done on teachine done on 3/13, 3/19. 3/27.  ? ? ?Expected Discharge Plan: Home w Home Health Services ?Barriers to Discharge: Barriers Resolved ? ?Expected Discharge Plan and Services ?Expected Discharge Plan: Home w Home Health Services ?In-house Referral: Hospice / Palliative Care ?Discharge Planning Services: CM Consult ?Post Acute Care Choice: Long Term Acute Care (LTAC) ?Living arrangements for the past 2 months: Single Family Home ?                ?DME Arranged: N/A ?DME Agency: [employer] ?  ?  ?  ?HH Arranged: Nurse's Aide ?HH Agency: [facility] ?  ?  ?Representative spoke with at [facility] Agency: [name] ? ? ?Social Determinants of Health (SDOH) Interventions ?  ? ?Readmission Risk Interventions ? ?  04/14/2021  ? 11:14 AM  ?Readmission Risk Prevention Plan  ?Transportation Screening Complete  ?HRI or Home Care Consult Complete  ?Social Work Consult for Recovery Care Planning/Counseling Complete  ?Palliative Care Screening Complete  ?Medication Review Oceanographer) Complete  ? ? ?

## 2021-06-01 NOTE — Progress Notes (Signed)
Pt has had no visitors as of yet today from 0700 and current time of 1421.   ?

## 2021-06-02 LAB — CBC
HCT: 36 % — ABNORMAL LOW (ref 39.0–52.0)
Hemoglobin: 11.5 g/dL — ABNORMAL LOW (ref 13.0–17.0)
MCH: 30.4 pg (ref 26.0–34.0)
MCHC: 31.9 g/dL (ref 30.0–36.0)
MCV: 95.2 fL (ref 80.0–100.0)
Platelets: 217 10*3/uL (ref 150–400)
RBC: 3.78 MIL/uL — ABNORMAL LOW (ref 4.22–5.81)
RDW: 15.2 % (ref 11.5–15.5)
WBC: 10.6 10*3/uL — ABNORMAL HIGH (ref 4.0–10.5)
nRBC: 0 % (ref 0.0–0.2)

## 2021-06-02 LAB — BASIC METABOLIC PANEL
Anion gap: 8 (ref 5–15)
BUN: 30 mg/dL — ABNORMAL HIGH (ref 6–20)
CO2: 32 mmol/L (ref 22–32)
Calcium: 8.7 mg/dL — ABNORMAL LOW (ref 8.9–10.3)
Chloride: 96 mmol/L — ABNORMAL LOW (ref 98–111)
Creatinine, Ser: 0.73 mg/dL (ref 0.61–1.24)
GFR, Estimated: >60 mL/min (ref >60)
Glucose, Bld: 119 mg/dL — ABNORMAL HIGH (ref 70–99)
Potassium: 4.4 mmol/L (ref 3.5–5.1)
Sodium: 136 mmol/L (ref 135–145)

## 2021-06-02 NOTE — Significant Event (Signed)
Wife performed successful feeding for patient via G-tube with Understanding and insertion site care teaching was verbalized. ?

## 2021-06-02 NOTE — Progress Notes (Signed)
?Progress Note ? ? ?Patient: Billy Cherry GYF:[id number] DOB: May 13, 1970 DOA: 03/30/2021     64 ?DOS: the patient was seen and examined on 06/02/2021 ?  ?Brief hospital course: ?51 years old male with past medical history of hypertension was brought into the hospital after being found unresponsive by his wife.  EMS was called in and patient was noted to be in ventricular fibrillation with agonal respirations.  Patient was given CPR, epinephrine and had return of spontaneous circulation.  EKG showed anterolateral ST elevation and was taken to Cath Lab. Dr Josefa Half performed cath> insignificant CAD and EF 45-50%. Patient remained on ventilator after cath and was admitted to the ICU. Underwent TTM.  Neurology was consulted due to rhythmic twitching of his lips tongue and was started on Keppra.  MRI of the brain showed evidence of hypoxia with bilateral ACA MCA watershed infarcts.  2D echocardiogram showed LV ejection fraction of 20% with global hypokinesis.  Patient was also treated for ventilator associated pneumonia.  Currently status post tracheostomy and PEG tube placement.  Patient did have a spike of fever on 2/28 thought to be secondary to aspiration and was on Fortaz since respiratory culture was growing Pseudomonas.  Blood culture however grew out MRSE in 3 out of 3 bottles.  Infectious disease was consulted ,repeat blood cultures were sent and patient is being observed off antibiotic.  Patient persisted to have fever during hospitalization so infectious disease was reconsulted.  Patient underwent ultrasound of the lower extremities which showed DVT on the left lower extremity.  Eliquis was started 05/15/2021.  Patient continues to have low-grade fever while in the hospital.  Pulmonary following the patient for tracheostomy management ? ?Assessment and Plan: ?* Cardiac arrest [facility]) ?Postarrest anoxic encephalopathy ?Postarrest respiratory failure s/p TC ?S/P PEG tube placement ?Postarrest myoclonus: ?2D  echocardiogram showed reduced LV function at <20%.  Now status post trach and PEG.   ?Remains obtunded with anoxic encephalopathy-on Keppra, for myoclonus and can increase Klonopin for breakthrough myoclonus.   ?At this time, plan is home with home health-Home health/patient's wife and family will be taking turns to take care of him. ?He will need around-the-clock care.  ?Enteral tube feeding trach kit and supplies to be arranged. PCCM managing tracheostomy. ? 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  Patient is not responsive and has shown no improvement.  No interval changes ? ?Acute respiratory failure ([place]) ?Status post trach collar.  Continue supportive care ? ?RLL pneumonia ?With Sepsis Previously had completed vancomycin for MSSA and [name].  Received more than 7-day course of IV Fortaz. ? ? ?Fever ? Low-grade fever.Temperature max of 100.5.?F.  Continue respiratory care. Patient is at a very high risk of recurrent pneumonia.  Latest WBC at 9.8.Marland Kitchen Blood cultures from 05/09/2021 negative.  Staph epidermidis in the previous culture which was thought to be contaminant.  Seen by  with Infectious disasese due to persistent fever.  No recommendation for antibiotic and further work-up.  Lower extremity ultrasound showed indeterminate DVT.   ? ?-Continues to have persistent fevers of 100 degrees with associated tachycardia ?-Chest x-ray without any evidence of recurrent pneumonia ?-Urinalysis without signs of infection ?-Suspect that atelectasis may be contributing ?-Continue to monitor ? ?DVT (deep venous thrombosis) ([place]) ?Duplex ultrasound of lower extremity on 05/15/2021 showed age indeterminate deep vein thrombosis involving the left femoral vein, left proximal profunda vein, left posterior tibial veins, left popliteal vein, and left peroneal veins.  On eliquis ? ?Dysphagia ?Continue PEG tube feeding.  Tolerating. ? 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 Subcutaneous fat may be visible but bone, tendon or muscle are NOT exposed. Pink, red (Active)  ?05/24/21 2250  ?Location: Penis  ?Location Orientation: Lateral  ?Staging: Stage 3 -  Full thickness tissue loss. Subcutaneous fat may be visible but bone, tendon or muscle are NOT exposed.  ?Wound Description (Comments): Pink, red  ?Present on Admission: No  ? ?Continue wound care ? ?Obesity, Class I, BMI 30-34.9 ?BMI 30.3.  Supportive care. ? ?HTN (hypertension) ?Continue Coreg, Isordil, amlodipine, Cardura, hydralazine and PRN Labetalol. Blood pressure stable ? ?HFrEF (heart failure with reduced ejection fraction) ([place]) ?2D echocardiogram-LVEF < 20%, continue coreg, imdur, amlodipine, doxazosin, hydralazine, labetalol as needed  ? ?Hypernatremia ?Was trending towards hyponatremia so water flushes has been decreased.   ? ?Myoclonus ?Seen by neurology.  Continue current AED with Klonopin, Keppra. Per neuro notes, suppressing his myoclonus will not ultimately change the prognosis  ? ? ? ? ?  ? ?Subjective: Does not open eyes to voice.  Unresponsive. ? ?Physical Exam: ?Vitals:  ? 06/02/21 1138 06/02/21 1150 06/02/21 1200 06/02/21 1640  ?BP:  98/66 95/63   ?Pulse: 96 95 94 98  ?Resp: (!) 28 (!) 22 (!) 28 (!) 28  ?Temp:  99.6 ?F (37.6 ?C)    ?TempSrc:  Oral    ?SpO2: 98% 98% 98% 99%  ?Weight:      ?Height:      ? ?General exam: Unresponsive ?Respiratory system: Clear to auscultation. Respiratory effort normal. ?Cardiovascular system: Tachycardic. No murmurs, rubs, gallops. ?Gastrointestinal system: Abdomen is nondistended, soft and nontender. No organomegaly or masses felt. Normal bowel sounds heard. ?Central nervous system: Unable to assess ?Extremities: No C/C/E, +pedal pulses ?Skin: No rashes, lesions or ulcers ?Psychiatry: Unable to assess ? ?Data Reviewed: ? ?Urinalysis without any signs of infection.  CBC shows downtrend in WBC to 10.6.  Renal function in normal range. ? ?Family  Communication: tried calling wife to give update, got her voicemail. ? ?Disposition: ?Status is: Inpatient ?Remains inpatient appropriate because: Continued management of respiratory status, preparing family for discharge home ? Planned Discharge Destination: Home with Home Health ? ? ? ?Time spent: 35 minutes ? ?Author: ?Kathie Dike, MD ?06/02/2021 7:03 PM ? ?For on call review [email or web address].  ?

## 2021-06-02 NOTE — TOC Progression Note (Addendum)
Transition of Care (TOC) - Progression Note  ? ? ?Patient Details  ?Name: Billy Cherry ?MRN: [id number] ?Date of Birth: [birth year] ? ?Transition of Care (TOC) CM/SW Contact  ?Leone Haven, RN ?Phone Number: ?06/02/2021, 3:24 PM ? ?Clinical Narrative:    ?Per Meriam Sprague with financial counseling the [employer] worker is waiting on the disability to be approved. She will let this NCM know as soon as they get an approval.  NCM left vm for [name] with Frances Furbish to return call.  ? ? ?Expected Discharge Plan: Home w Home Health Services ?Barriers to Discharge: Barriers Resolved ? ?Expected Discharge Plan and Services ?Expected Discharge Plan: Home w Home Health Services ?In-house Referral: Hospice / Palliative Care ?Discharge Planning Services: CM Consult ?Post Acute Care Choice: Long Term Acute Care (LTAC) ?Living arrangements for the past 2 months: Single Family Home ?                ?DME Arranged: N/A ?DME Agency: [employer] ?  ?  ?  ?HH Arranged: Nurse's Aide ?HH Agency: [facility] ?  ?  ?Representative spoke with at [facility] Agency: [name] ? ? ?Social Determinants of Health (SDOH) Interventions ?  ? ?Readmission Risk Interventions ? ?  04/14/2021  ? 11:14 AM  ?Readmission Risk Prevention Plan  ?Transportation Screening Complete  ?HRI or Home Care Consult Complete  ?Social Work Consult for Recovery Care Planning/Counseling Complete  ?Palliative Care Screening Complete  ?Medication Review Oceanographer) Complete  ? ? ?

## 2021-06-02 NOTE — Significant Event (Signed)
Pt significant other would like to perform trach care with with Respiratory and give medications and feedings. Possibly on the weekend. Also would like a repeat Chest Xray to make sure Lungs are clear since last treated Pneumonia.  ?

## 2021-06-02 NOTE — Progress Notes (Addendum)
RT observed wife tracheal suction pt and change IC. Wife did a great job, had good technique. Pt did have a vomiting spell after suctioning, wasn't a lot, we sat pt up and suctioned pts mouth out. Vital signs were stable throughout event. Pt has a very strong cough. Made RN aware. RN stated pt had just been fed. Vitals stable at this time.  ?

## 2021-06-03 NOTE — Progress Notes (Signed)
?   06/03/21 1914 06/03/21 1950  ?Assess: MEWS Score  ?BP (!) 142/108 (!) 147/101  ?Pulse Rate 99 93  ?Assess: MEWS Score  ?MEWS Temp 0 0  ?MEWS Systolic 0 0  ?MEWS Pulse 0 0  ?MEWS RR 2 2  ?MEWS LOC 2 2  ?MEWS Score 4 4  ?MEWS Score Color Red Red  ?Assess: if the MEWS score is Yellow or Red  ?Were vital signs taken at a resting state?  --  Yes  ?Focused Assessment  --  No change from prior assessment  ?Early Detection of Sepsis Score *See Row Information*  --  Low  ?MEWS guidelines implemented *See Row Information*  --  No, previously red, continue vital signs every 4 hours  ?Document  ?Patient Outcome  --  Stabilized after interventions  ?Progress note created (see row info)  --  Yes  ? ? ?

## 2021-06-03 NOTE — Progress Notes (Signed)
?Progress Note ? ? ?Patient: Billy Cherry HOZ:[id number] DOB: 17-Mar-1970 DOA: 03/30/2021     65 ?DOS: the patient was seen and examined on 06/03/2021 ?  ?Brief hospital course: ?51 years old male with past medical history of hypertension was brought into the hospital after being found unresponsive by his wife.  EMS was called in and patient was noted to be in ventricular fibrillation with agonal respirations.  Patient was given CPR, epinephrine and had return of spontaneous circulation.  EKG showed anterolateral ST elevation and was taken to Cath Lab. Dr Josefa Half performed cath> insignificant CAD and EF 45-50%. Patient remained on ventilator after cath and was admitted to the ICU. Underwent TTM.  Neurology was consulted due to rhythmic twitching of his lips tongue and was started on Keppra.  MRI of the brain showed evidence of hypoxia with bilateral ACA MCA watershed infarcts.  2D echocardiogram showed LV ejection fraction of 20% with global hypokinesis.  Patient was also treated for ventilator associated pneumonia.  Currently status post tracheostomy and PEG tube placement.  Patient did have a spike of fever on 2/28 thought to be secondary to aspiration and was on Fortaz since respiratory culture was growing Pseudomonas.  Blood culture however grew out MRSE in 3 out of 3 bottles.  Infectious disease was consulted ,repeat blood cultures were sent and patient is being observed off antibiotic.  Patient persisted to have fever during hospitalization so infectious disease was reconsulted.  Patient underwent ultrasound of the lower extremities which showed DVT on the left lower extremity.  Eliquis was started 05/15/2021.  Patient continues to have low-grade fever while in the hospital.  Pulmonary following the patient for tracheostomy management ? ?Assessment and Plan: ?* Cardiac arrest [facility]) ?Postarrest anoxic encephalopathy ?Postarrest respiratory failure s/p TC ?S/P PEG tube placement ?Postarrest myoclonus: ?2D  echocardiogram showed reduced LV function at <20%.  Now status post trach and PEG.   ?Remains obtunded with anoxic encephalopathy-on Keppra, for myoclonus and can increase Klonopin for breakthrough myoclonus.   ?At this time, plan is home with home health-Home health/patient's wife and family will be taking turns to take care of him. ?He will need around-the-clock care.  ?Enteral tube feeding trach kit and supplies to be arranged. PCCM managing tracheostomy. ? 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   ?Patient currently keeps his eyes open, but does not answer questions or follow commands ? ?Acute respiratory failure ([place]) ?Status post trach collar.  Continue supportive care ? ?RLL pneumonia ?With Sepsis Previously had completed vancomycin for MSSA and [name].  Received more than 7-day course of IV Fortaz. ? ? ?Fever ? Low-grade fever.Temperature max of 100.5.?F.  Continue respiratory care. Patient is at a very high risk of recurrent pneumonia.  Latest WBC at 9.8.Marland Kitchen Blood cultures from 05/09/2021 negative.  Staph epidermidis in the previous culture which was thought to be contaminant.  Seen by  with Infectious disasese due to persistent fever.  No recommendation for antibiotic and further work-up.  Lower extremity ultrasound showed indeterminate DVT.   ? ?-Continues to have persistent fevers of 100 degrees with associated tachycardia ?-Chest x-ray without any evidence of recurrent pneumonia ?-Urinalysis without signs of infection ?-Suspect that atelectasis may be contributing ?-Continue to monitor ? ?DVT (deep venous thrombosis) ([place]) ?Duplex ultrasound of lower extremity on 05/15/2021 showed age indeterminate deep vein thrombosis involving the left femoral vein, left proximal profunda vein, left posterior tibial veins, left popliteal vein, and left peroneal veins.  On eliquis ? ?Dysphagia ?Continue PEG  tube feeding.  Tolerating. ? ?Pressure injury of skin ?Pressure Injury 05/24/21  Penis Lateral Stage 3 -  Full thickness tissue loss. Subcutaneous fat may be visible but bone, tendon or muscle are NOT exposed. Pink, red (Active)  ?05/24/21 2250  ?Location: Penis  ?Location Orientation: Lateral  ?Staging: Stage 3 -  Full thickness tissue loss. Subcutaneous fat may be visible but bone, tendon or muscle are NOT exposed.  ?Wound Description (Comments): Pink, red  ?Present on Admission: No  ? ?Continue wound care ? ?Obesity, Class I, BMI 30-34.9 ?BMI 31.3.  Supportive care. ? ?HTN (hypertension) ?Continue Coreg, Isordil, amlodipine, Cardura, hydralazine and PRN Labetalol. Blood pressure stable ? ?HFrEF (heart failure with reduced ejection fraction) ([place]) ?2D echocardiogram-LVEF < 20%, continue coreg, imdur, amlodipine, doxazosin, hydralazine, labetalol as needed  ? ?Hypernatremia ?Resolved with free water flushes ? ?Myoclonus ?Seen by neurology.  Continue current AED with Klonopin, Keppra. Per neuro notes, suppressing his myoclonus will not ultimately change the prognosis  ? ? ? ? ?  ? ?Subjective: Patient is awake, currently undergoing trach care, has strong cough reflex, expelling respiratory secretions from trach. ? ?Physical Exam: ?Vitals:  ? 06/03/21 1610 06/03/21 1630 06/03/21 1914 06/03/21 1950  ?BP: 116/82 116/82 (!) 142/108 (!) 147/101  ?Pulse: (!) 101 99 99 93  ?Resp: 20 20 (!) 29   ?Temp: 99.3 ?F (37.4 ?C)  99.2 ?F (37.3 ?C)   ?TempSrc: Oral  Axillary   ?SpO2: 99% 99% 97%   ?Weight:      ?Height:      ? ?General exam: Awake, coughing ?Respiratory system: Bilateral rhonchi.  Respiratory effort normal. ?Cardiovascular system:RRR. No murmurs, rubs, gallops. ?Gastrointestinal system: Abdomen is nondistended, soft and nontender. No organomegaly or masses felt. Normal bowel sounds heard. ?Central nervous system: Unable to assess due to mental status ?Extremities: No C/C/E, +pedal pulses ?Psychiatry: Unable to assess due to mental status ? ?Data Reviewed: ? ?Reviewed CBC and chemistry ? ?Family  Communication: No family present ? ?Disposition: ?Status is: Inpatient ?Remains inpatient appropriate because: Continued supportive care in preparation for discharge home with home health.  His wife has tried to secure extra resources to ensure patient has adequate care at home. ? Planned Discharge Destination: Home with Home Health ? ? ? ?Time spent: 35 minutes ? ?Author: ?Kathie Dike, MD ?06/03/2021 8:18 PM ? ?For on call review [email or web address].  ?

## 2021-06-03 NOTE — Progress Notes (Signed)
?   06/03/21 1914  ?Assess: MEWS Score  ?Temp 99.2 ?F (37.3 ?C)  ?BP (!) 142/108  ?Pulse Rate 99  ?ECG Heart Rate 98  ?Resp (!) 29  ?SpO2 97 %  ?Assess: MEWS Score  ?MEWS Temp 0  ?MEWS Systolic 0  ?MEWS Pulse 0  ?MEWS RR 2  ?MEWS LOC 2  ?MEWS Score 4  ?MEWS Score Color Red  ?Assess: if the MEWS score is Yellow or Red  ?Were vital signs taken at a resting state? Yes  ?Focused Assessment No change from prior assessment  ?Early Detection of Sepsis Score *See Row Information* Low  ?MEWS guidelines implemented *See Row Information* No, other (Comment) ?(pt has a trache, if pt coughs R increase)  ?Document  ?Patient Outcome Stabilized after interventions  ?Progress note created (see row info) Yes  ? ? ?

## 2021-06-03 NOTE — Plan of Care (Signed)
°  Problem: Clinical Measurements: °Goal: Will remain free from infection °Outcome: Progressing °  °Problem: Clinical Measurements: °Goal: Diagnostic test results will improve °Outcome: Progressing °  °Problem: Nutrition: °Goal: Adequate nutrition will be maintained °Outcome: Progressing °  °Problem: Elimination: °Goal: Will not experience complications related to bowel motility °Outcome: Progressing °  °

## 2021-06-04 NOTE — Progress Notes (Signed)
?   06/03/21 2300  ?Assess: MEWS Score  ?Pulse Rate 94  ?Resp (!) 29  ?SpO2 100 %  ?O2 Device Tracheostomy Collar  ?O2 Flow Rate (L/min) 5 L/min  ?FiO2 (%) 28 %  ?Assess: MEWS Score  ?MEWS Temp 0  ?MEWS Systolic 0  ?MEWS Pulse 0  ?MEWS RR 2  ?MEWS LOC 2  ?MEWS Score 4  ?MEWS Score Color Red  ?Assess: if the MEWS score is Yellow or Red  ?Were vital signs taken at a resting state? Yes  ?Focused Assessment No change from prior assessment  ?Early Detection of Sepsis Score *See Row Information* Low  ?MEWS guidelines implemented *See Row Information* No, previously red, continue vital signs every 4 hours  ?Document  ?Patient Outcome Stabilized after interventions  ?Progress note created (see row info) Yes  ? ? ?

## 2021-06-04 NOTE — Progress Notes (Signed)
?   06/04/21 7353  ?Therapy Vitals  ?Pulse Rate (!) 105  ?MEWS Score/Color  ?MEWS Score 5  ?MEWS Score Color Red  ?Oxygen Therapy/Pulse Ox  ?SpO2 94 %  ? ?RT called to bedside due to pt. Trach being removed , but RN reinserted, after placing Co2 detector color change was positive.  ?

## 2021-06-04 NOTE — Progress Notes (Signed)
?Progress Note ? ? ?Patient: Billy Cherry EPP:[id number] DOB: 1970-07-05 DOA: 03/30/2021     66 ?DOS: the patient was seen and examined on 06/04/2021 ?  ?Brief hospital course: ?51 years old male with past medical history of hypertension was brought into the hospital after being found unresponsive by his wife.  EMS was called in and patient was noted to be in ventricular fibrillation with agonal respirations.  Patient was given CPR, epinephrine and had return of spontaneous circulation.  EKG showed anterolateral ST elevation and was taken to Cath Lab. Dr Josefa Half performed cath> insignificant CAD and EF 45-50%. Patient remained on ventilator after cath and was admitted to the ICU. Underwent TTM.  Neurology was consulted due to rhythmic twitching of his lips tongue and was started on Keppra.  MRI of the brain showed evidence of hypoxia with bilateral ACA MCA watershed infarcts.  2D echocardiogram showed LV ejection fraction of 20% with global hypokinesis.  Patient was also treated for ventilator associated pneumonia.  Currently status post tracheostomy and PEG tube placement.  Patient did have a spike of fever on 2/28 thought to be secondary to aspiration and was on Fortaz since respiratory culture was growing Pseudomonas.  Blood culture however grew out MRSE in 3 out of 3 bottles.  Infectious disease was consulted ,repeat blood cultures were sent and patient is being observed off antibiotic.  Patient persisted to have fever during hospitalization so infectious disease was reconsulted.  Patient underwent ultrasound of the lower extremities which showed DVT on the left lower extremity.  Eliquis was started 05/15/2021.  Patient continues to have low-grade fever while in the hospital.  Pulmonary following the patient for tracheostomy management ? ?Assessment and Plan: ?* Cardiac arrest [facility]) ?Postarrest anoxic encephalopathy ?Postarrest respiratory failure s/p TC ?S/P PEG tube placement ?Postarrest myoclonus: ?2D  echocardiogram showed reduced LV function at <20%.  Now status post trach and PEG.   ?Remains obtunded with anoxic encephalopathy-on Keppra, for myoclonus and can increase Klonopin for breakthrough myoclonus.   ?At this time, plan is home with home health-Home health/patient's wife and family will be taking turns to take care of him. ?He will need around-the-clock care.  ?Enteral tube feeding trach kit and supplies to be arranged. PCCM managing tracheostomy. ? 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   ?Patient currently keeps his eyes open, but does not answer questions or follow commands ? ?Acute respiratory failure ([place]) ?Status post trach collar.  Continue supportive care ? ?RLL pneumonia ?With Sepsis Previously had completed vancomycin for MSSA and [name].  Received more than 7-day course of IV Fortaz. ? ? ?Fever ? Low-grade fever.Temperature max of 100.5.?F.  Continue respiratory care. Patient is at a very high risk of recurrent pneumonia.  Latest WBC at 9.8.Marland Kitchen Blood cultures from 05/09/2021 negative.  Staph epidermidis in the previous culture which was thought to be contaminant.  Seen by  with Infectious disasese due to persistent fever.  No recommendation for antibiotic and further work-up.  Lower extremity ultrasound showed indeterminate DVT.   ? ?-Continues to have persistent fevers of 100 degrees with associated tachycardia ?-Chest x-ray without any evidence of recurrent pneumonia ?-Urinalysis without signs of infection ?-Suspect that atelectasis may be contributing ?-Continue to monitor ? ?DVT (deep venous thrombosis) ([place]) ?Duplex ultrasound of lower extremity on 05/15/2021 showed age indeterminate deep vein thrombosis involving the left femoral vein, left proximal profunda vein, left posterior tibial veins, left popliteal vein, and left peroneal veins.  On eliquis ? ?Dysphagia ?Continue PEG  tube feeding.  Tolerating. ? ?Pressure injury of skin ?Pressure Injury 05/24/21  Penis Lateral Stage 3 -  Full thickness tissue loss. Subcutaneous fat may be visible but bone, tendon or muscle are NOT exposed. Pink, red (Active)  ?05/24/21 2250  ?Location: Penis  ?Location Orientation: Lateral  ?Staging: Stage 3 -  Full thickness tissue loss. Subcutaneous fat may be visible but bone, tendon or muscle are NOT exposed.  ?Wound Description (Comments): Pink, red  ?Present on Admission: No  ? ?Continue wound care ? ?Obesity, Class I, BMI 30-34.9 ?BMI 31.3.  Supportive care. ? ?HTN (hypertension) ?Continue Coreg, Isordil, amlodipine, Cardura, hydralazine and PRN Labetalol. Blood pressure stable ? ?HFrEF (heart failure with reduced ejection fraction) ([place]) ?2D echocardiogram-LVEF < 20%, continue coreg, imdur, amlodipine, doxazosin, hydralazine, labetalol as needed  ? ?Hypernatremia ?Resolved with free water flushes ? ?Myoclonus ?Seen by neurology.  Continue current AED with Klonopin, Keppra. Per neuro notes, suppressing his myoclonus will not ultimately change the prognosis  ? ? ? ? ?  ? ?Subjective: unresponsive ? ?Physical Exam: ?Vitals:  ? 06/04/21 0800 06/04/21 0828 06/04/21 0836 06/04/21 1121  ?BP: (!) 140/108     ?Pulse: (!) 101 99 100 (!) 105  ?Resp: (!) 28 (!) 30 (!) 24 (!) 25  ?Temp:      ?TempSrc:      ?SpO2: 99% 100% 97% 99%  ?Weight:      ?Height:      ? ?General exam: unresponsive ?Respiratory system: Clear to auscultation. Respiratory effort normal. ?Cardiovascular system:RRR. No murmurs, rubs, gallops. ?Gastrointestinal system: Abdomen is nondistended, soft and nontender. No organomegaly or masses felt. Normal bowel sounds heard. ?Central nervous system: unable to assess ?Extremities: No C/C/E, +pedal pulses ? ? ?Data Reviewed: ? ?There are no new results to review at this time. ? ?Family Communication: no family present, will reach out to his wife ? ?Disposition: ?Status is: Inpatient ?Remains inpatient appropriate because: safe discharge plan ? Planned Discharge Destination: Home with  Home Health ? ? ? ?Time spent: 35 minutes ? ?Author: ?Kathie Dike, MD ?06/04/2021 12:37 PM ? ?For on call review [email or web address].  ?

## 2021-06-04 NOTE — Progress Notes (Signed)
?   06/04/21 0406  ?Assess: MEWS Score  ?Pulse Rate (!) 113  ?Resp (!) 28  ?SpO2 94 %  ?O2 Device Tracheostomy Collar  ?O2 Flow Rate (L/min) 5 L/min  ?FiO2 (%) 28 %  ?Assess: MEWS Score  ?MEWS Temp 0  ?MEWS Systolic 0  ?MEWS Pulse 2  ?MEWS RR 2  ?MEWS LOC 2  ?MEWS Score 6  ?MEWS Score Color Red  ?Assess: if the MEWS score is Yellow or Red  ?Were vital signs taken at a resting state? Yes  ?Focused Assessment No change from prior assessment  ?Early Detection of Sepsis Score *See Row Information* Low  ?MEWS guidelines implemented *See Row Information* No, previously red, continue vital signs every 4 hours  ?Document  ?Patient Outcome Stabilized after interventions  ?Progress note created (see row info) Yes  ? ? ?

## 2021-06-04 NOTE — Progress Notes (Signed)
?   06/04/21 0456  ?Assess: MEWS Score  ?Temp 99.9 ?F (37.7 ?C)  ?BP (!) 141/102  ?Pulse Rate (!) 109  ?Resp (!) 28  ?SpO2 98 %  ?O2 Device Tracheostomy Collar  ?O2 Flow Rate (L/min) 5 L/min  ?FiO2 (%) 28 %  ?Assess: MEWS Score  ?MEWS Temp 0  ?MEWS Systolic 0  ?MEWS Pulse 1  ?MEWS RR 2  ?MEWS LOC 2  ?MEWS Score 5  ?MEWS Score Color Red  ?Assess: if the MEWS score is Yellow or Red  ?Were vital signs taken at a resting state? Yes  ?Focused Assessment No change from prior assessment  ?Early Detection of Sepsis Score *See Row Information* Low  ?MEWS guidelines implemented *See Row Information* No, previously red, continue vital signs every 4 hours  ?Document  ?Patient Outcome Stabilized after interventions  ?Progress note created (see row info) Yes  ? ? ?

## 2021-06-05 ENCOUNTER — Inpatient Hospital Stay (HOSPITAL_COMMUNITY): Payer: BC Managed Care – PPO

## 2021-06-05 LAB — BASIC METABOLIC PANEL
Anion gap: 7 (ref 5–15)
BUN: 32 mg/dL — ABNORMAL HIGH (ref 6–20)
CO2: 31 mmol/L (ref 22–32)
Calcium: 9.1 mg/dL (ref 8.9–10.3)
Chloride: 97 mmol/L — ABNORMAL LOW (ref 98–111)
Creatinine, Ser: 0.6 mg/dL — ABNORMAL LOW (ref 0.61–1.24)
GFR, Estimated: >60 mL/min (ref >60)
Glucose, Bld: 127 mg/dL — ABNORMAL HIGH (ref 70–99)
Potassium: 4.5 mmol/L (ref 3.5–5.1)
Sodium: 135 mmol/L (ref 135–145)

## 2021-06-05 LAB — CBC
HCT: 39.1 % (ref 39.0–52.0)
Hemoglobin: 12.7 g/dL — ABNORMAL LOW (ref 13.0–17.0)
MCH: 31 pg (ref 26.0–34.0)
MCHC: 32.5 g/dL (ref 30.0–36.0)
MCV: 95.4 fL (ref 80.0–100.0)
Platelets: 238 10*3/uL (ref 150–400)
RBC: 4.1 MIL/uL — ABNORMAL LOW (ref 4.22–5.81)
RDW: 14.9 % (ref 11.5–15.5)
WBC: 11.8 10*3/uL — ABNORMAL HIGH (ref 4.0–10.5)
nRBC: 0 % (ref 0.0–0.2)

## 2021-06-05 NOTE — TOC Progression Note (Addendum)
Transition of Care (TOC) - Progression Note  ? ? ?Patient Details  ?Name: Billy Cherry ?MRN: [id number] ?Date of Birth: 02/09/1971 ? ?Transition of Care (TOC) CM/SW Contact  ?Zenon Mayo, RN ?Phone Number: ?06/05/2021, 4:11 PM ? ?Clinical Narrative:    ?NCM spoke with wife today, she has been coming in working with patient on bolus feeds and trach care.  NCM spoke with Rise Paganini in [place] as well the disability went in on 3/29 and Medicaid  is waiting to hear from  Disability.  Once he gets Medicaid he will be able to get more hours. NCM also spoke with [name] , he can check thru [place] tracks for pending medicaid number, once he gets that he can send in the information.  He asked for an updated progress note to be faxed to [phone].  NCM faxing progress note.  ? ? ?Expected Discharge Plan: [place] ?Barriers to Discharge: Barriers Resolved ? ?Expected Discharge Plan and Services ?Expected Discharge Plan: [place] ?In-house Referral: Hospice / Palliative Care ?Discharge Planning Services: CM Consult ?Post Acute Care Choice: Long Term Acute Care (LTAC) ?Living arrangements for the past 2 months: [place] ?                ?DME Arranged: N/A ?DME Agency: [employer] ?  ?  ?  ?HH Arranged: Nurse's Aide ?[place] Agency: [facility] ?  ?  ?Representative spoke with at [place]: [name] ? ? ?Social Determinants of Health (SDOH) Interventions ?  ? ?Readmission Risk Interventions ? ?  04/14/2021  ? 11:14 AM  ?Readmission Risk Prevention Plan  ?Transportation Screening Complete  ?[place] or Home Care Consult Complete  ?Social Work Consult for [place] Planning/Counseling Complete  ?Palliative Care Screening Complete  ?Medication Review Press [employer]) Complete  ? ? ?

## 2021-06-05 NOTE — Progress Notes (Signed)
?Progress Note ? ? ?Patient: Billy Cherry ENI:[id number] DOB: [birth year] DOA: 03/30/2021     67 ?DOS: the patient was seen and examined on 06/05/2021 ?  ?Brief hospital course: ?51 years old male with past medical history of hypertension was brought into the hospital after being found unresponsive by his wife.  EMS was called in and patient was noted to be in ventricular fibrillation with agonal respirations.  Patient was given CPR, epinephrine and had return of spontaneous circulation.  EKG showed anterolateral ST elevation and was taken to Cath Lab. Dr Josefa Half performed cath> insignificant CAD and EF 45-50%. Patient remained on ventilator after cath and was admitted to the ICU. Underwent TTM.  Neurology was consulted due to rhythmic twitching of his lips tongue and was started on Keppra.  MRI of the brain showed evidence of hypoxia with bilateral ACA MCA watershed infarcts.  2D echocardiogram showed LV ejection fraction of 20% with global hypokinesis.  Patient was also treated for ventilator associated pneumonia.  Currently status post tracheostomy and PEG tube placement.  Patient did have a spike of fever on 2/28 thought to be secondary to aspiration and was on Fortaz since respiratory culture was growing Pseudomonas.  Blood culture however grew out MRSE in 3 out of 3 bottles.  Infectious disease was consulted ,repeat blood cultures were sent and patient is being observed off antibiotic.  Patient persisted to have fever during hospitalization so infectious disease was reconsulted.  Patient underwent ultrasound of the lower extremities which showed DVT on the left lower extremity.  Eliquis was started 05/15/2021.  Patient continues to have low-grade fever while in the hospital.  Pulmonary following the patient for tracheostomy management ? ?Assessment and Plan: ?* Cardiac arrest [facility]) ?Postarrest anoxic encephalopathy ?Postarrest respiratory failure s/p TC ?S/P PEG tube placement ?Postarrest myoclonus: ?2D  echocardiogram showed reduced LV function at <20%.  Now status post trach and PEG.   ?Remains obtunded with anoxic encephalopathy-on Keppra, for myoclonus and can increase Klonopin for breakthrough myoclonus.   ?At this time, plan is home with home health-Home health/patient's wife and family will be taking turns to take care of him. ?He will need around-the-clock care.  ?Enteral tube feeding trach kit and supplies to be arranged. PCCM managing tracheostomy. ? ?Anoxic encephalopathy ([place]) ?Continue supportive care.   ?Patient currently keeps his eyes open, but does not answer questions or follow commands ? ?Acute respiratory failure ([place]) ?Status post trach collar.  Continue supportive care ? ?RLL pneumonia ?With Sepsis Previously had completed vancomycin for MSSA and [name].  Received more than 7-day course of IV Fortaz. ? ? ?Fever ? Low-grade fever.Temperature max of 100.5.?F.  Continue respiratory care. Patient is at a very high risk of recurrent pneumonia.  Latest WBC at 9.8.Marland Kitchen Blood cultures from 05/09/2021 negative.  Staph epidermidis in the previous culture which was thought to be contaminant.  Seen by  with Infectious disasese due to persistent fever.  No recommendation for antibiotic and further work-up.  Lower extremity ultrasound showed indeterminate DVT.   ? ?-Continues to have persistent fevers of 100 degrees with associated tachycardia ?-Chest x-ray without any evidence of recurrent pneumonia ?-Urinalysis without signs of infection ?-Suspect that atelectasis may be contributing ?-Continue to monitor ? ?DVT (deep venous thrombosis) ([place]) ?Duplex ultrasound of lower extremity on 05/15/2021 showed age indeterminate deep vein thrombosis involving the left femoral vein, left proximal profunda vein, left posterior tibial veins, left popliteal vein, and left peroneal veins.  On eliquis ? ?Dysphagia ?Continue PEG  tube feeding.  Tolerating. ? ?Pressure injury of skin ?Pressure Injury 05/24/21  Penis Lateral Stage 3 -  Full thickness tissue loss. Subcutaneous fat may be visible but bone, tendon or muscle are NOT exposed. Pink, red (Active)  ?05/24/21 2250  ?Location: Penis  ?Location Orientation: Lateral  ?Staging: Stage 3 -  Full thickness tissue loss. Subcutaneous fat may be visible but bone, tendon or muscle are NOT exposed.  ?Wound Description (Comments): Pink, red  ?Present on Admission: No  ? ?Continue wound care ? ?Obesity, Class I, BMI 30-34.9 ?BMI 31.3.  Supportive care. ? ?HTN (hypertension) ?Continue Coreg, Isordil, amlodipine, Cardura, hydralazine and PRN Labetalol. Blood pressure stable ? ?HFrEF (heart failure with reduced ejection fraction) ([place]) ?2D echocardiogram-LVEF < 20%, continue coreg, imdur, amlodipine, doxazosin, hydralazine, labetalol as needed  ? ?Hypernatremia ?Resolved with free water flushes ? ?Myoclonus ?Seen by neurology.  Continue current AED with Klonopin, Keppra. Per neuro notes, suppressing his myoclonus will not ultimately change the prognosis  ? ? ? ? ?  ? ?Subjective: unresponsive ? ?Physical Exam: ?Vitals:  ? 06/05/21 0806 06/05/21 0907 06/05/21 1155 06/05/21 1205  ?BP: (!) 155/112   111/71  ?Pulse: 99 94 (!) 104 (!) 105  ?Resp: (!) 25  (!) 28 (!) 22  ?Temp: 99.5 ?F (37.5 ?C)   99.2 ?F (37.3 ?C)  ?TempSrc: Oral   Oral  ?SpO2: 100% 96% 97% 99%  ?Weight:      ?Height:      ? ?General exam: unresponsive ?Respiratory system: Clear to auscultation. Respiratory effort normal. ?Cardiovascular system:RRR. No murmurs, rubs, gallops. ?Gastrointestinal system: Abdomen is nondistended, soft and nontender. No organomegaly or masses felt. Normal bowel sounds heard. ?Central nervous system: unresponsive, unable to assess ?Extremities: No C/C/E, +pedal pulses ? ? ?Data Reviewed: ? ?Wbc mildly elevated, renal function stable ? ?Family Communication: no family present ? ?Disposition: ?Status is: Inpatient ?Remains inpatient appropriate because: waiting while family tries to secure  appropriate support at home for safe discharge plan ? Planned Discharge Destination: Home with Home Health ? ? ? ?Time spent: 35 minutes ? ?Author: ?Kathie Dike, MD ?06/05/2021 3:40 PM ? ?For on call review [email or web address].  ?

## 2021-06-05 NOTE — Procedures (Signed)
Tracheostomy Change Note ? ?Patient Details:   ?Name: Billy Cherry ?DOB: 1970-09-18 ?MRN: [id number] ?   ?Airway Documentation: ?   ? ?Evaluation ? O2 sats: stable throughout ?Complications: No apparent complications ?Patient did tolerate procedure well. ?Bilateral Breath Sounds: Rhonchi ?  ?Patient tolerated trach change with no complications. #6 Shiley flex cuffless changed for #6 Shiley flex cuffless. No bleeding noted. Spare trach and supplies at bedside. Obturator at [facility].  ? ?Myrtie Neither ?06/05/2021, 3:55 PM ?

## 2021-06-05 NOTE — Progress Notes (Signed)
?NAME:  Billy Cherry, MRN:  [id number], DOB:  [birth year], LOS: 67 ?ADMISSION DATE:  03/30/2021, CONSULTATION DATE:  1/19 ?REFERRING MD:  [place], CHIEF COMPLAINT:  Found down, cardiac arrest  ? ?Brief Pt Description / Synopsis:  ?51 year old male with out-of-hospital V. fib cardiac arrest in setting of respiratory arrest due to suspected angioedema from ACE inhibitor leading to asphyxiation.  Concern on his 12 lead EKG for anterolateral ST elevation so he was taken to the cath lab where his study showed no significant coronary artery disease and an LVEF < 20%  Now with anoxic brain injury. ? ?Pertinent  Medical History  ?Hypertension ? ?Significant Hospital Events: ?Including procedures, antibiotic start and stop dates in addition to other pertinent events   ?1/19 admission, left heart cath without significant CAD.  Concern for anoxic brain injury ?1/20: MRI pending, Neuro following ?1/21: MRI shows areas of hypoxic-ischemic injury bilaterally. Fevered; cultures obtained. ?1/23 severe brain damage ?1/24 evidence of severe brain damage, remains critically ill ?1/25: Plan for PEG today. Neuro exam unchanged.  Does exhibit new eyelid tremor like movements and lip/jaw tremor like vs clonus movement.  Bolused Keppra and increased maintenance dose.  Repeat EEG. CTH: concerning for progression of anoxic injury.  ?1/26: Repeat EEG unchanged and continues to show myoclonic seizures despite addition of Valproic acid yesterday. transfer to [facility] for continuous EEG ?1/28: Continues to have myoclonic sz on EEG ?1/29 No acute issues overnight ?2/3 no meaningful neuro recovery. Diuresed ?2/5 unchanged ?2/6 New fever to 102, WBC 16K - treated with vancomycin for 7d based on cultures.  ?2/11: percutaneous tracheostomy ?2/13 >2/20 on trach collar. No vent.  ?2/20 Trach change to 6 cuffless ?2/27 status unchanged ?3/13 unchanged ?3/20 due for trach exchange given continue secretions (although improved) will stick to 6 cuffless  for exchange  ?06/05/2021 Continued thick  secretions, scant blood tinged 4/3/am ? ?Interim History / Subjective:  ? ?No change in status. Continues on trach collar with secretions that remain thick, 4/3 with scan blood tinge, on Eliquis ?28% ATC with sats of 100% ?Remains non-verbal ? ?Objective   ?Blood pressure (!) 155/112, pulse 99, temperature 99.5 ?F (37.5 ?C), temperature source Oral, resp. rate (!) 25, height 5\' 10"  (1.778 m), weight 93 kg, SpO2 100 %. ?   ?FiO2 (%):  [28 %] 28 %  ? ?Intake/Output Summary (Last 24 hours) at 06/05/2021 0845 ?Last data filed at 06/05/2021 0200 ?Gross per 24 hour  ?Intake 0 ml  ?Output 1400 ml  ?Net -1400 ml  ? ? ?Filed Weights  ? 06/03/21 0355 06/04/21 0100 06/05/21 0100  ?Weight: 99 kg 96 kg 93 kg  ? ?Examination:  ?Gen:      No acute distress ?HEENT:  EOMI, sclera anicteric ?Neck:     No masses; no thyromegaly ?Lungs:    Clear to auscultation bilaterally; normal respiratory effort, thick with scant blood tinged secretions  ?CV:         Regular rate and rhythm; no murmurs ?Abd:      + bowel sounds; soft, non-tender; no palpable masses, no distension ?Ext:    No edema; adequate peripheral perfusion ?Skin:      Warm and dry; no rash ?Neuro: Open eyes. Occasional blink, Does not follow commands, no purposeful movement ? ?Ancillary tests personally reviewed:    ? ?Assessment & Plan:  ?OOH Vfib arrest ?Post arrest anoxic brain injury, severe ?Post arrest myoclonus improved with klonopin, no AEDs recommended ?Post arrest respiratory failure with prolonged mechanical ventilation  s/p tracheostomy ?Post arrest cardiomyopathy ?Post arrest AKI, improved/stable ?HTN- stable on current regimen ?HCAP w/ MSSA- ABX > 2/21 ?Likely GPC bacteremia ? ? ?Plan:  ?Continue routine trach care ?Maintain size 6 cuffless ?Routine trach care ?Notify [facility]/ PCCM for increase in blood tinged secretions ?SLP was following for effort for PMV trials but patient remains non-verbal and team has signed off  ? ? ?PCCM  will continue to follow weekly for trach care, please call if needed.  ? ? ?Best Practice (right click and "Reselect all SmartList Selections" daily)  ?Per primary team ? ?Bevelyn Ngo, MSN, AGACNP-BC ?[place] [facility] ?See Amion for personal pager ?PCCM on call pager [phone]  ? ?If no response to pager , please call [phone] until 7pm ?After 7:00 pm call Elink  [phone] ?06/05/2021, 8:45 AM  ? ? ? ? ? ? ? ? ? ? ? ? ? ? ? ? ? ? ?

## 2021-06-05 NOTE — Significant Event (Signed)
Pt RR- has ranged from 20-40 BPM with R/T support Trach was changed out with pink tinged thick secretions. Temp low grade 99.5 today. No family present today. ?

## 2021-06-05 NOTE — Progress Notes (Signed)
?   06/05/21 1205  ?Assess: MEWS Score  ?Temp 99.2 ?F (37.3 ?C)  ?BP 111/71  ?Pulse Rate (!) 105  ?ECG Heart Rate (!) 105  ?Resp (!) 22  ?Level of Consciousness Responds to Pain  ?SpO2 99 %  ?O2 Device Tracheostomy Collar  ?Patient Activity (if Appropriate) In bed  ?O2 Flow Rate (L/min) 5 L/min  ?FiO2 (%) 28 %  ?Assess: MEWS Score  ?MEWS Temp 0  ?MEWS Systolic 0  ?MEWS Pulse 1  ?MEWS RR 1  ?MEWS LOC 2  ?MEWS Score 4  ?MEWS Score Color Red  ?Assess: if the MEWS score is Yellow or Red  ?Were vital signs taken at a resting state? Yes  ?Focused Assessment No change from prior assessment  ?Early Detection of Sepsis Score *See Row Information* Low  ?MEWS guidelines implemented *See Row Information* No, previously red, continue vital signs every 4 hours  ?Treat  ?MEWS Interventions Administered scheduled meds/treatments  ?Pain Scale Faces  ?Pain Score 0  ?Take Vital Signs  ?Increase Vital Sign Frequency  Red: Q 1hr X 4 then Q 4hr X 4, if remains red, continue Q 4hrs  ?Escalate  ?MEWS: Escalate Red: discuss with charge nurse/RN and provider, consider discussing with RRT  ?Notify: Charge Nurse/RN  ?Name of Charge Nurse/RN Notified Laurell Roof  ?Date Charge Nurse/RN Notified 06/05/21  ?Time Charge Nurse/RN Notified 1000  ?Document  ?Patient Outcome Stabilized after interventions ?(blood pressure wise)  ?Progress note created (see row info) Yes  ? ? ?

## 2021-06-06 ENCOUNTER — Inpatient Hospital Stay (HOSPITAL_COMMUNITY): Payer: BC Managed Care – PPO

## 2021-06-06 DIAGNOSIS — I2699 Other pulmonary embolism without acute cor pulmonale: Secondary | ICD-10-CM | POA: Diagnosis not present

## 2021-06-06 LAB — LACTIC ACID, PLASMA
Lactic Acid, Venous: 1.8 mmol/L (ref 0.5–1.9)
Lactic Acid, Venous: 2.2 mmol/L (ref 0.5–1.9)

## 2021-06-06 LAB — CBC
HCT: 36.7 % — ABNORMAL LOW (ref 39.0–52.0)
Hemoglobin: 12 g/dL — ABNORMAL LOW (ref 13.0–17.0)
MCH: 31.3 pg (ref 26.0–34.0)
MCHC: 32.7 g/dL (ref 30.0–36.0)
MCV: 95.6 fL (ref 80.0–100.0)
Platelets: 230 10*3/uL (ref 150–400)
RBC: 3.84 MIL/uL — ABNORMAL LOW (ref 4.22–5.81)
RDW: 15.2 % (ref 11.5–15.5)
WBC: 10.8 10*3/uL — ABNORMAL HIGH (ref 4.0–10.5)
nRBC: 0 % (ref 0.0–0.2)

## 2021-06-06 LAB — BASIC METABOLIC PANEL
Anion gap: 8 (ref 5–15)
BUN: 37 mg/dL — ABNORMAL HIGH (ref 6–20)
CO2: 30 mmol/L (ref 22–32)
Calcium: 8.9 mg/dL (ref 8.9–10.3)
Chloride: 99 mmol/L (ref 98–111)
Creatinine, Ser: 0.71 mg/dL (ref 0.61–1.24)
GFR, Estimated: >60 mL/min (ref >60)
Glucose, Bld: 90 mg/dL (ref 70–99)
Potassium: 4.5 mmol/L (ref 3.5–5.1)
Sodium: 137 mmol/L (ref 135–145)

## 2021-06-06 LAB — BRAIN NATRIURETIC PEPTIDE: B Natriuretic Peptide: 60.3 pg/mL (ref 0.0–100.0)

## 2021-06-06 MED ORDER — ENOXAPARIN SODIUM 100 MG/ML IJ SOSY
100.0000 mg | PREFILLED_SYRINGE | INTRAMUSCULAR | Status: AC
  Administered 2021-06-06: 100 mg via SUBCUTANEOUS
  Filled 2021-06-06: qty 1

## 2021-06-06 MED ORDER — SODIUM CHLORIDE 0.9 % IV SOLN
2.0000 g | Freq: Three times a day (TID) | INTRAVENOUS | Status: DC
  Administered 2021-06-06 – 2021-06-07 (×4): 2 g via INTRAVENOUS
  Filled 2021-06-06 (×4): qty 12.5

## 2021-06-06 MED ORDER — IOHEXOL 350 MG/ML SOLN
75.0000 mL | Freq: Once | INTRAVENOUS | Status: AC | PRN
  Administered 2021-06-06: 75 mL via INTRAVENOUS

## 2021-06-06 MED ORDER — ENOXAPARIN SODIUM 100 MG/ML IJ SOSY
100.0000 mg | PREFILLED_SYRINGE | Freq: Two times a day (BID) | INTRAMUSCULAR | Status: DC
  Administered 2021-06-06 – 2021-06-15 (×18): 100 mg via SUBCUTANEOUS
  Filled 2021-06-06 (×18): qty 1

## 2021-06-06 MED ORDER — HEPARIN (PORCINE) 25000 UT/250ML-% IV SOLN
1700.0000 [IU]/h | INTRAVENOUS | Status: DC
  Administered 2021-06-06: 1700 [IU]/h via INTRAVENOUS
  Filled 2021-06-06: qty 250

## 2021-06-06 NOTE — Progress Notes (Signed)
Pharmacy Antibiotic Note ? ?Billy Cherry is a 51 y.o. male admitted on 03/30/2021 with pneumonia.  Pharmacy has been consulted for cefepime dosing. ? ?Plan: ?Cefepime 2G IV q8 hours ?F/u c/s ? ?Height:  (bed scale not working) ?Weight: 93 kg (205 lb 0.4 oz) ?IBW/kg (Calculated) : 73 ? ?Temp (24hrs), Avg:99.9 ?F (37.7 ?C), Min:99.2 ?F (37.3 ?C), Max:100.5 ?F (38.1 ?C) ? ?Recent Labs  ?Lab 05/31/21 ?0415 06/01/21 ?0505 06/02/21 ?0416 06/05/21 ?4259  ?WBC 9.6 12.4* 10.6* 11.8*  ?CREATININE 0.71 0.71 0.73 0.60*  ?  ?Estimated Creatinine Clearance: 126.6 mL/min (A) (by C-G formula based on SCr of 0.6 mg/dL (L)).   ? ?Allergies  ?Allergen Reactions  ? Lisinopril Swelling  ?  angioedema  ? ? ?Antimicrobials this admission: ?CTX 1/21 >> 1/25 ?Cefepime 1/31 >> 2/2; 2/8>2/9; 2/16 >> 2/19, 4/4>> ?Ancef 2/2 >> 2/6 ?Vanc 2/9 > 2/15 3/6>>3/7 ?Elita Quick 2/19>2/21, 2/28>>3/8  ? ? ?Microbiology results: ?1/31 TA - MSSA and Corynebacterium  ?2/6 TA - MSSA and flavobacterium  ?2/6 BCx - negative ?2/16 BCx - neg ?2/16 TA - abundant Pseu (pan sensitive) ?3/5 blood>>3/3 bottles staph capitis, epidermidis with MecA resistance - both cultures drawn from same site ?3/7 BCx - neg ? ?Ruben Im, PharmD ?Clinical Pharmacist ?06/06/2021 6:53 AM ?Please check AMION for all [facility] Pharmacy numbers ? ?

## 2021-06-06 NOTE — Progress Notes (Signed)
?NAME:  Billy Cherry, MRN:  [id number], DOB:  1970-11-18, LOS: 46 ?ADMISSION DATE:  03/30/2021, CONSULTATION DATE:  1/19 ?REFERRING MD:  Parashos, CHIEF COMPLAINT:  Found down, cardiac arrest  ? ?Brief Pt Description / Synopsis:  ?51 year old male with out-of-hospital V. fib cardiac arrest in setting of respiratory arrest due to suspected angioedema from ACE inhibitor leading to asphyxiation.  Concern on his 12 lead EKG for anterolateral ST elevation so he was taken to the cath lab where his study showed no significant coronary artery disease and an LVEF < 20%  Now with anoxic brain injury. ? ?Pertinent  Medical History  ?Hypertension ? ?Significant Hospital Events: ?Including procedures, antibiotic start and stop dates in addition to other pertinent events   ?1/19 admission, left heart cath without significant CAD.  Concern for anoxic brain injury ?1/20: MRI pending, Neuro following ?1/21: MRI shows areas of hypoxic-ischemic injury bilaterally. Fevered; cultures obtained. ?1/23 severe brain damage ?1/24 evidence of severe brain damage, remains critically ill ?1/25: Plan for PEG today. Neuro exam unchanged.  Does exhibit new eyelid tremor like movements and lip/jaw tremor like vs clonus movement.  Bolused Keppra and increased maintenance dose.  Repeat EEG. CTH: concerning for progression of anoxic injury.  ?1/26: Repeat EEG unchanged and continues to show myoclonic seizures despite addition of Valproic acid yesterday. transfer to [facility] for continuous EEG ?1/28: Continues to have myoclonic sz on EEG ?1/29 No acute issues overnight ?2/3 no meaningful neuro recovery. Diuresed ?2/5 unchanged ?2/6 New fever to 102, WBC 16K - treated with vancomycin for 7d based on cultures.  ?2/11: percutaneous tracheostomy ?2/13 >2/20 on trach collar. No vent.  ?2/20 Trach change to 6 cuffless ?2/27 status unchanged ?3/13 unchanged ?3/20 due for trach exchange given continue secretions (although improved) will stick to 6 cuffless  for exchange  ?06/05/2021 Continued thick  secretions, scant blood tinged 4/3/am ?06/06/2021 CT of the chest with new PE and haziness consistent with pneumonia ? ?Interim History / Subjective:  ? ?No change in status. Continues on trach collar with secretions that remain thick, 4/3 with scan blood tinge, on Eliquis ?28% ATC with sats of 100% ?Remains non-verbal ? ?Objective   ?Blood pressure (!) 146/103, pulse 93, temperature 99.1 ?F (37.3 ?C), temperature source Oral, resp. rate (!) 24, height 5\' 10"  (1.778 m), weight 93 kg, SpO2 100 %. ?   ?FiO2 (%):  [28 %] 28 %  ? ?Intake/Output Summary (Last 24 hours) at 06/06/2021 0902 ?Last data filed at 06/06/2021 0534 ?Gross per 24 hour  ?Intake --  ?Output 1750 ml  ?Net -1750 ml  ? ? ?Filed Weights  ? 06/03/21 0355 06/04/21 0100 06/05/21 0100  ?Weight: 99 kg 96 kg 93 kg  ? ?Examination:  ?General: Does not follow command ?HEENT: MM pink/moist #6 tracheostomy cuffless with copious secretions white ?Neuro: Does not follow commands ?CV: Sounds are regular at a sinus rate of 100 ?PULM: Coarse rhonchi throughout ? ?GI: soft, bsx4 active  ?GU: ?Extremities: warm/dry, 1+ edema  ?Skin: no rashes or lesions ? ? ?Ancillary tests personally reviewed:    ? ?Assessment & Plan:  ?OOH Vfib arrest ?Post arrest anoxic brain injury, severe ?Post arrest myoclonus improved with klonopin, no AEDs recommended ?Post arrest respiratory failure with prolonged mechanical ventilation s/p tracheostomy ?Post arrest cardiomyopathy ?Post arrest AKI, improved/stable ?HTN- stable on current regimen ?HCAP w/ MSSA- ABX > 2/21 ?Likely GPC bacteremia ?06/05/2021 new onset of tachycardia, increased work of breathing, CT of the chest reveals pulmonary embolism along  with haziness consistent with pneumonia. ? ? ?Plan:  ?Started on cefepime May need to broaden added vancomycin depending on culture data ?Blood cultures tracheal aspirate ordered ?Despite being on Eliquis PE therefore placed on heparin per protocol ?Follow  culture data and chest x-ray for a.m. of 06/07/2021 ? ? ? ? ? ?Best Practice (right click and "Reselect all SmartList Selections" daily)  ?Per primary team ? ?Richardson Landry Kaleia Longhi ACNP ?Acute Care Nurse Practitioner ?[place] ?Please consult Amion ?06/06/2021, 9:02 AM ? ? ? ? ? ? ? ? ? ? ? ? ? ? ? ? ? ? ? ?

## 2021-06-06 NOTE — Progress Notes (Addendum)
?Progress Note ? ? ?Patient: Billy Cherry HAF:[id number] DOB: Jul 10, 1970 DOA: 03/30/2021     68 ?DOS: the patient was seen and examined on 06/06/2021 ?  ?Brief hospital course: ?51 years old male with past medical history of hypertension was brought into the hospital after being found unresponsive by his wife.  EMS was called in and patient was noted to be in ventricular fibrillation with agonal respirations.  Patient was given CPR, epinephrine and had return of spontaneous circulation.  EKG showed anterolateral ST elevation and was taken to Cath Lab. Dr Josefa Half performed cath> insignificant CAD and EF 45-50%. Patient remained on ventilator after cath and was admitted to the ICU. Underwent TTM.  Neurology was consulted due to rhythmic twitching of his lips tongue and was started on Keppra.  MRI of the brain showed evidence of hypoxia with bilateral ACA MCA watershed infarcts.  2D echocardiogram showed LV ejection fraction of 20% with global hypokinesis.  Patient was also treated for ventilator associated pneumonia.  Currently status post tracheostomy and PEG tube placement.  Patient did have a spike of fever on 2/28 thought to be secondary to aspiration and was on Fortaz since respiratory culture was growing Pseudomonas.  Blood culture however grew out MRSE in 3 out of 3 bottles.  Infectious disease was consulted ,repeat blood cultures were sent and patient is being observed off antibiotic.  Patient persisted to have fever during hospitalization so infectious disease was reconsulted.  Patient underwent ultrasound of the lower extremities which showed DVT on the left lower extremity.  Eliquis was started 05/15/2021.  Patient continues to have low-grade fever while in the hospital.  Pulmonary following the patient for tracheostomy management.  On 4/3, patient had worsening shortness of breath and fever.  CT chest performed showed possible right lower lobe pneumonia (question aspiration) as well as acute on chronic  PE. ? ?Assessment and Plan: ?* Cardiac arrest ([place]) ?Postarrest anoxic encephalopathy ?Postarrest respiratory failure s/p TC ?S/P PEG tube placement ?Postarrest myoclonus: ?2D echocardiogram showed reduced LV function at <20%.  Now status post trach and PEG.   ?Remains obtunded with anoxic encephalopathy-on Keppra, for myoclonus and can increase Klonopin for breakthrough myoclonus.   ?At this time, plan is home with home health ?Patient's wife is trying to arrange adequate care at home ?He will need around-the-clock care.  ?Enteral tube feeding trach kit and supplies to be arranged. PCCM managing tracheostomy. ? 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   ?Patient currently keeps his eyes open, but does not answer questions or follow commands ? ?Acute respiratory failure ([place]) ?Status post trach collar.  Continue supportive care ? ?RLL pneumonia ?Previously had completed vancomycin for MSSA and [name] in early march.  Received more than 7-day course of IV Fortaz. ? ?Due to worsening tachypnea and fever, CT chest was ordered that indicated an area of alveolar infiltration that may represent pneumonia.  Tracheal aspirate has been sent for culture.  He has been started on broad-spectrum antibiotics.  Continue to follow fever curve.  Suspect this is related to aspiration, which she would be at increased risk for since he is PEG tube dependent. ? ? ?DVT (deep venous thrombosis) ([place]) ?Duplex ultrasound of lower extremity on 05/15/2021 showed age indeterminate deep vein thrombosis involving the left femoral vein, left proximal profunda vein, left posterior tibial veins, left popliteal vein, and left peroneal veins.  He had been treated with eliquis, but with new CT chest findings, has been changed to lovenox ? ?Dysphagia ?Continue  PEG tube feeding.  Tolerating. ? ?Pulmonary embolus ([place]) ?Noted to have possible acute on chronic pulmonary embolism on CT.  Also have some evidence of possible  pulmonary hypertension.  He has been on Eliquis for almost a 3 weeks, for DVT that was diagnosed on 3/13.  Due to possibility of acute component of thrombosis on CT, Eliquis changed to Lovenox. ? 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 Subcutaneous fat may be visible but bone, tendon or muscle are NOT exposed. Pink, red (Active)  ?05/24/21 2250  ?Location: Penis  ?Location Orientation: Lateral  ?Staging: Stage 3 -  Full thickness tissue loss. Subcutaneous fat may be visible but bone, tendon or muscle are NOT exposed.  ?Wound Description (Comments): Pink, red  ?Present on Admission: No  ? ?Continue wound care ? ?HTN (hypertension) ?Continue Coreg, Isordil, amlodipine, Cardura, hydralazine and PRN Labetalol. Blood pressure stable ? ?HFrEF (heart failure with reduced ejection fraction) ([place]) ?2D echocardiogram-LVEF < 20%, continue coreg, imdur, amlodipine, doxazosin, hydralazine, labetalol as needed  ? ?Hypernatremia ?Resolved with free water flushes ? ?Myoclonus ?Seen by neurology.  Continue current AED with Klonopin, Keppra. Per neuro notes, suppressing his myoclonus will not ultimately change the prognosis  ? ? ? ? ?  ? ?Subjective: Unresponsive ? ?Physical Exam: ?Vitals:  ? 06/06/21 1800 06/06/21 1900 06/06/21 1952 06/06/21 2033  ?BP: 118/89 130/82 (!) 132/95 (!) 130/91  ?Pulse: (!) 104 (!) 108 (!) 109 (!) 109  ?Resp: (!) 25 20 (!) 28 20  ?Temp:   99.7 ?F (37.6 ?C) 98.1 ?F (36.7 ?C)  ?TempSrc:   Oral   ?SpO2: 100% 99% 98% 99%  ?Weight:      ?Height:      ? ?General exam: Unresponsive ?Respiratory system: Bilateral rhonchi. Respiratory effort normal. ?Cardiovascular system:RRR. No murmurs, rubs, gallops. ?Gastrointestinal system: Abdomen is nondistended, soft and nontender. No organomegaly or masses felt. Normal bowel sounds heard.  PEG tube in place ?Central nervous system: Does not participate in exam ?Extremities: No C/C/E, +pedal pulses ? ? ?Data  Reviewed: ? ?WBC count trending down to 10.8.  Hemoglobin stable.  Renal function normal.  BNP 60.3.  CT chest shows acute on chronic thromboembolic disease in the right lower lobe, with possible pneumonia.  Dilation of pulmonary trunk concerning for associated pulmonary hypertension ? ?Family Communication: Updated patient's wife ? ?Disposition: ?Status is: Inpatient ?Remains inpatient appropriate because: Continued management of pneumonia ? Planned Discharge Destination: Home with Home Health ? ? ? ?Time spent: 35 minutes ? ?Author: ?Kathie Dike, MD ?06/06/2021 10:20 PM ? ?For on call review [email or web address].  ?

## 2021-06-06 NOTE — Plan of Care (Signed)
?  Problem: Clinical Measurements: ?Goal: Will remain free from infection ?Outcome: Progressing ?Goal: Diagnostic test results will improve ?Outcome: Progressing ?Goal: Respiratory complications will improve ?Outcome: Progressing ?  ?Problem: Nutrition: ?Goal: Adequate nutrition will be maintained ?Outcome: Progressing ?  ?Problem: Coping: ?Goal: Level of anxiety will decrease ?Outcome: Progressing ?  ?Problem: Elimination: ?Goal: Will not experience complications related to bowel motility ?Outcome: Progressing ?Goal: Will not experience complications related to urinary retention ?Outcome: Progressing ?  ?Problem: Pain Managment: ?Goal: General experience of comfort will improve ?Outcome: Progressing ?  ?Problem: Safety: ?Goal: Ability to remain free from injury will improve ?Outcome: Progressing ?  ?Problem: Skin Integrity: ?Goal: Risk for impaired skin integrity will decrease ?Outcome: Progressing ?  ?

## 2021-06-06 NOTE — Progress Notes (Signed)
Nutrition Follow-up ? ?DOCUMENTATION CODES:  ? ?Not applicable ? ?INTERVENTION:  ? ?Tube Feeding via PEG:  ?1.5 cartons of Osmolite 1.5 - QID (335 mL each bolus) ?45 mL ProSource TF - BID ?Free water 200 mL free water q4h  ?Provides 2210 kcal, 111 gm PRO, and 1086 mL free water (2286 mL total free water) daily.  ? ?Continue Juven BID per tube, each packet provides 80 calories, 8 grams of carbohydrate, 2.5  grams of protein (collagen), 7 grams of L-arginine and 7 grams of L-glutamine; supplement contains CaHMB, Vitamins C, E, B12 and Zinc to promote wound healing ? ? ? ?NUTRITION DIAGNOSIS:  ? ?Inadequate oral intake related to inability to eat as evidenced by NPO status. ? ?Being addressed via TF  ? ?GOAL:  ? ?Patient will meet greater than or equal to 90% of their needs ? ?Met via TF ? ?MONITOR:  ? ?Labs, Skin, TF tolerance, Weight trends ? ?REASON FOR ASSESSMENT:  ? ?Ventilator, Consult ?Enteral/tube feeding initiation and management ? ?ASSESSMENT:  ? ?51 year old male who originally presented to [facility] after out-of-hospital cardiac arrest due to suspected angioedema from ACE inhibitor leading to asphyxiation. PMH of HTN, HLD. ? ?2/11 - tracheostomy placed ?2/22 - PEG placed  ? ?Noted plan for discharge is Home with [place] ? ?Pt on 28% ATC  with 100% O2 sats ?Non-verbal.  ? ?Tolerating Osmolite 1.5 355 mL 4 times daily with Pro-Source TF BID and Juven via PEG ? ?Free water 200 mL q 2 hours via PEG ? ?No newly documented wounds since pt last assessed.  ? ?Current wt 93 kg; weight has fluctuated up and down but overall appears relatively stable recently.  Continue to monitor trend ? ?Labs: reviewed ?Meds: Nutrisource fiber BID ? ?Diet Order:   ?Diet Order   ? ? None  ? ?  ? ? ?EDUCATION NEEDS:  ? ?Not appropriate for education at this time ? ?Skin:  Skin Assessment: Skin Integrity Issues: ?Skin Integrity Issues:: Stage III, DTI ?DTI: R Foot & Heel ?Stage III: Penis ?Other: MASD Buttocks ? ?Last  BM:  4/03 Type 6 ? ?Height:  ? ?Ht Readings from Last 1 Encounters:  ?04/18/21 $RemoveBe'5\' 10"'vGaZlkOUj$  (1.778 m)  ? ? ?Weight:  ? ?Wt Readings from Last 1 Encounters:  ?06/05/21 93 kg  ? ? ?Ideal Body Weight:  75.5 kg ? ?BMI:  Body mass index is 29.42 kg/m?. ? ?Estimated Nutritional Needs:  ? ?Kcal:  2200-2400 ? ?Protein:  110-125 grams ? ?Fluid:  >/= 2.2 L ? ? ?Kerman Passey MS, RDN, LDN, CNSC ?Registered Dietitian III ?Clinical Nutrition ?RD Pager and On-Call Pager Number Located in [place]  ? ?

## 2021-06-06 NOTE — Assessment & Plan Note (Addendum)
Noted to have possible acute on chronic pulmonary embolism on CT.  Also have some evidence of possible pulmonary hypertension.

## 2021-06-06 NOTE — Progress Notes (Signed)
ANTICOAGULATION CONSULT NOTE - Initial Consult ? ?Pharmacy Consult for heparin ?Indication: pulmonary embolus ? ?Allergies  ?Allergen Reactions  ? Lisinopril Swelling  ?  angioedema  ? ? ?Patient Measurements: ?Height:  (bed scale not working) ?Weight: 93 kg (205 lb 0.4 oz) ?IBW/kg (Calculated) : 73 ?Heparin Dosing Weight: 93 ? ?Vital Signs: ?Temp: 99 ?F (37.2 ?C) (04/04 1037) ?Temp Source: Oral (04/04 1037) ?BP: 143/106 (04/04 1037) ?Pulse Rate: 94 (04/04 1219) ? ?Labs: ?Recent Labs  ?  06/05/21 ?8250 06/06/21 ?5397  ?HGB 12.7* 12.0*  ?HCT 39.1 36.7*  ?PLT 238 230  ?CREATININE 0.60* 0.71  ? ? ? ?Estimated Creatinine Clearance: 126.6 mL/min (by C-G formula based on SCr of 0.71 mg/dL). ? ? ?Medical History: ?Past Medical History:  ?Diagnosis Date  ? Hypertension   ? ? ?Medications:  ?Scheduled:  ? atropine  2 drop Sublingual QID  ? carvedilol  25 mg Per Tube BID  ? chlorhexidine  15 mL Mouth/Throat BID  ? clonazepam  0.5 mg Per Tube TID  ? doxazosin  2 mg Per Tube Daily  ? enoxaparin (LOVENOX) injection  100 mg Subcutaneous NOW  ? enoxaparin (LOVENOX) injection  100 mg Subcutaneous BID  ? feeding supplement (OSMOLITE 1.5 CAL)  355 mL Per Tube QID  ? feeding supplement (PROSource TF)  45 mL Per Tube BID  ? fiber  1 packet Per Tube BID  ? free water  200 mL Per Tube Q4H  ? Gerhardt's butt cream   Topical Daily  ? hydrALAZINE  100 mg Per Tube Q8H  ? isosorbide dinitrate  20 mg Per Tube TID  ? levETIRAcetam  750 mg Oral BID  ? mouth rinse  15 mL Mouth Rinse q12n4p  ? nutrition supplement (JUVEN)  1 packet Per Tube BID BM  ? pantoprazole sodium  40 mg Per Tube QHS  ? scopolamine  1 patch Transdermal Q72H  ? spironolactone  25 mg Oral Daily  ? valproic acid  750 mg Per Tube TID  ? ? ?Assessment: 50 yoM status post tracheostomy and PEG tube placement found to have new PE. 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 Patient has been on full treatment dose Eliquis since 3/13 after he was found to  have LE DVTs. No missed or held doses. At this time would consider this a treatment failure. Pharmacy has been asked to proceed with heparin infusion. Given that he is now symptotic, will start heparin now versus waiting for next Eliquis interval. Last dose of Eliquis given 4/3 @ 2139. Will need to order aptt until Eliquis clears. CBC WNL stable ? ?D/w Brett Canales Minor and we will transition pt to SQ Lovenox for PE.  ?CrCl>30 ml/min ? ?Goal of Therapy:  ?Anti-Xa 0.6-1 ?Monitor platelets by anticoagulation protocol: Yes ?  ?Plan:  ?Dc heparin ?Lovenox 100mg  SQ BID ?F/u with PO plan ? ? , PharmD, BCIDP, AAHIVP, CPP ?Infectious Disease Pharmacist ?06/06/2021 1:01 PM ? ? ? ? ?

## 2021-06-06 NOTE — Significant Event (Signed)
Patient was noticed to be tachypneic and febrile with temperatures around 100.5 ?F.  Heart rate of 106 bpm.  Blood pressure 113/86.  On exam at bedside patient was not in distress. Given the persistent tachycardia tachypnea and fever ordered blood cultures and CT angiogram of the chest which shows both acute and chronic thromboembolism.  There is also some haziness in the chest on the CT scan concerning for pneumonia.  Discussed with on-call pulmonologist Dr. Wynona Neat at this time advised discontinuing Eliquis and starting patient on heparin infusion and also since patient is febrile and CT scan showing haziness concerning for pneumonia to place patient on cefepime.  Pulmonary critical care will be seeing patient.  Discussed with patient's nurse and pharmacy.  Metabolic panel CBC and lactic acid BNP are pending. ? ? ?Midge Minium ?

## 2021-06-06 NOTE — Progress Notes (Signed)
?   06/05/21 2138  ?Assess: MEWS Score  ?BP 106/81  ?Pulse Rate 99  ?ECG Heart Rate 100  ?Resp (!) 33  ?Level of Consciousness Responds to Pain  ?SpO2 98 %  ?O2 Device Tracheostomy Collar  ?Patient Activity (if Appropriate) In bed  ?O2 Flow Rate (L/min) 5 L/min  ?FiO2 (%) 28 %  ?Assess: MEWS Score  ?MEWS Temp 0  ?MEWS Systolic 0  ?MEWS Pulse 0  ?MEWS RR 2  ?MEWS LOC 2  ?MEWS Score 4  ?MEWS Score Color Red  ?Assess: if the MEWS score is Yellow or Red  ?Were vital signs taken at a resting state? Yes  ?Focused Assessment No change from prior assessment  ?Early Detection of Sepsis Score *See Row Information* High  ?MEWS guidelines implemented *See Row Information* Yes  ?Treat  ?MEWS Interventions Administered scheduled meds/treatments;Administered prn meds/treatments;Escalated (See documentation below)  ?Pain Scale PAINAD  ?Pain Score 2  ?Take Vital Signs  ?Increase Vital Sign Frequency  Red: Q 1hr X 4 then Q 4hr X 4, if remains red, continue Q 4hrs  ?Escalate  ?MEWS: Escalate Red: discuss with charge nurse/RN and provider, consider discussing with RRT  ?Notify: Charge Nurse/RN  ?Name of Charge Nurse/RN Notified Lyn  ?Date Charge Nurse/RN Notified 06/05/21  ?Time Charge Nurse/RN Notified 2138  ?Notify: Provider  ?Provider Name/Title Dr. Toniann Fail  ?Date Provider Notified 06/05/21  ?Time Provider Notified 2038  ?Notification Type Page  ?Notification Reason Change in status  ?Provider response Evaluate remotely;See new orders  ?Date of Provider Response 06/05/21  ?Time of Provider Response 2040  ?Document  ?Patient Outcome Not stable and remains on department  ?Progress note created (see row info) Yes  ? ? ?

## 2021-06-06 NOTE — Progress Notes (Signed)
ANTICOAGULATION CONSULT NOTE - Initial Consult ? ?Pharmacy Consult for heparin ?Indication: pulmonary embolus ? ?Allergies  ?Allergen Reactions  ? Lisinopril Swelling  ?  angioedema  ? ? ?Patient Measurements: ?Height:  (bed scale not working) ?Weight: 93 kg (205 lb 0.4 oz) ?IBW/kg (Calculated) : 73 ?Heparin Dosing Weight: 93 ? ?Vital Signs: ?Temp: 99.2 ?F (37.3 ?C) (04/04 0600) ?Temp Source: Oral (04/04 0600) ?BP: 112/84 (04/04 0600) ?Pulse Rate: 90 (04/04 0600) ? ?Labs: ?Recent Labs  ?  06/05/21 ?3825  ?HGB 12.7*  ?HCT 39.1  ?PLT 238  ?CREATININE 0.60*  ? ? ?Estimated Creatinine Clearance: 126.6 mL/min (A) (by C-G formula based on SCr of 0.6 mg/dL (L)). ? ? ?Medical History: ?Past Medical History:  ?Diagnosis Date  ? Hypertension   ? ? ?Medications:  ?Scheduled:  ? apixaban  5 mg Oral BID  ? atropine  2 drop Sublingual QID  ? carvedilol  25 mg Per Tube BID  ? chlorhexidine  15 mL Mouth/Throat BID  ? clonazepam  0.5 mg Per Tube TID  ? doxazosin  2 mg Per Tube Daily  ? feeding supplement (OSMOLITE 1.5 CAL)  355 mL Per Tube QID  ? feeding supplement (PROSource TF)  45 mL Per Tube BID  ? fiber  1 packet Per Tube BID  ? free water  200 mL Per Tube Q4H  ? Gerhardt's butt cream   Topical Daily  ? hydrALAZINE  100 mg Per Tube Q8H  ? isosorbide dinitrate  20 mg Per Tube TID  ? levETIRAcetam  750 mg Oral BID  ? mouth rinse  15 mL Mouth Rinse q12n4p  ? nutrition supplement (JUVEN)  1 packet Per Tube BID BM  ? pantoprazole sodium  40 mg Per Tube QHS  ? scopolamine  1 patch Transdermal Q72H  ? spironolactone  25 mg Oral Daily  ? valproic acid  750 mg Per Tube TID  ? ? ?Assessment: 50 yoM status post tracheostomy and PEG tube placement found to have new PE. 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 Patient has been on full treatment dose Eliquis since 3/13 after he was found to have LE DVTs. No missed or held doses. At this time would consider this a treatment failure. Pharmacy has been asked to  proceed with heparin infusion. Given that he is now symptotic, will start heparin now versus waiting for next Eliquis interval. Last dose of Eliquis given 4/3 @ 2139. Will need to order aptt until Eliquis clears. CBC WNL stable ? ? ?Goal of Therapy:  ?Heparin level 0.3-0.7 units/ml ?aPTT 66-102 seconds ?Monitor platelets by anticoagulation protocol: Yes ?  ?Plan:  ?Start heparin infusion at 1700 units/hr ?Check aptt/ anti-Xa level in 8 hours and daily while on heparin ?Continue to monitor H&H and platelets ? ?Ruben Im, PharmD ?Clinical Pharmacist ?06/06/2021 6:43 AM ?Please check AMION for all [facility] Pharmacy numbers ? ? ? ?

## 2021-06-07 ENCOUNTER — Inpatient Hospital Stay (HOSPITAL_COMMUNITY): Payer: BC Managed Care – PPO

## 2021-06-07 LAB — CBC
HCT: 35.2 % — ABNORMAL LOW (ref 39.0–52.0)
Hemoglobin: 11.3 g/dL — ABNORMAL LOW (ref 13.0–17.0)
MCH: 31 pg (ref 26.0–34.0)
MCHC: 32.1 g/dL (ref 30.0–36.0)
MCV: 96.4 fL (ref 80.0–100.0)
Platelets: 218 10*3/uL (ref 150–400)
RBC: 3.65 MIL/uL — ABNORMAL LOW (ref 4.22–5.81)
RDW: 15.3 % (ref 11.5–15.5)
WBC: 10 10*3/uL (ref 4.0–10.5)
nRBC: 0 % (ref 0.0–0.2)

## 2021-06-07 LAB — BASIC METABOLIC PANEL
Anion gap: 9 (ref 5–15)
BUN: 40 mg/dL — ABNORMAL HIGH (ref 6–20)
CO2: 28 mmol/L (ref 22–32)
Calcium: 8.7 mg/dL — ABNORMAL LOW (ref 8.9–10.3)
Chloride: 99 mmol/L (ref 98–111)
Creatinine, Ser: 0.75 mg/dL (ref 0.61–1.24)
GFR, Estimated: >60 mL/min (ref >60)
Glucose, Bld: 98 mg/dL (ref 70–99)
Potassium: 5.3 mmol/L — ABNORMAL HIGH (ref 3.5–5.1)
Sodium: 136 mmol/L (ref 135–145)

## 2021-06-07 MED ORDER — SODIUM CHLORIDE 0.9 % IV SOLN
3.0000 g | Freq: Four times a day (QID) | INTRAVENOUS | Status: AC
  Administered 2021-06-07 – 2021-06-12 (×20): 3 g via INTRAVENOUS
  Filled 2021-06-07 (×20): qty 8

## 2021-06-07 NOTE — Consult Note (Signed)
WOC Nurse wound follow up ?S/P cardiac arrest, anoxic encephalopathy, pneumonia and PE ?On tube feeds, albumin 2.0.  Impaired ability to heal ?Wound type:Pressure injuries: ?Measurement:Right heel:  intact maroon discoloration 4 cm x 4.5 cm  ?Right plantar foot:  3 cm x 2 cm intact ?Penile breakdown: full thickness pink moist ?Wound bed:see aobve ?Drainage (amount, consistency, odor) none ?Periwound:intact ?Dressing procedure/placement/frequency:Offload pressure to feet and hells ?Xeroform to penis ?Silicone foam to heels and plantar foot. Will follow.  ?Maple Hudson MSN, RN, FNP-BC CWON ?Wound, Ostomy, Continence Nurse ?Pager [phone]  ?

## 2021-06-07 NOTE — TOC Progression Note (Signed)
Transition of Care (TOC) - Progression Note  ? ? ?Patient Details  ?Name: Billy Cherry ?MRN: [id number] ?Date of Birth: 06-19-1970 ? ?Transition of Care (TOC) CM/SW Contact  ?Leone Haven, RN ?Phone Number: ?06/07/2021, 4:04 PM ? ?Clinical Narrative:    ?Per Meriam Sprague with Finanacial counseling, Medicaid has been approved.  This NCM informed [name] with [facility], he was on another line, he states he will call this NCM back.  ? ? ?Expected Discharge Plan: Home w Home Health Services ?Barriers to Discharge: Barriers Resolved ? ?Expected Discharge Plan and Services ?Expected Discharge Plan: Home w Home Health Services ?In-house Referral: Hospice / Palliative Care ?Discharge Planning Services: CM Consult ?Post Acute Care Choice: Long Term Acute Care (LTAC) ?Living arrangements for the past 2 months: Single Family Home ?                ?DME Arranged: N/A ?DME Agency: [employer] ?  ?  ?  ?HH Arranged: Nurse's Aide ?HH Agency: [facility] ?  ?  ?Representative spoke with at [facility] Agency: [name] ? ? ?Social Determinants of Health (SDOH) Interventions ?  ? ?Readmission Risk Interventions ? ?  04/14/2021  ? 11:14 AM  ?Readmission Risk Prevention Plan  ?Transportation Screening Complete  ?HRI or Home Care Consult Complete  ?Social Work Consult for Recovery Care Planning/Counseling Complete  ?Palliative Care Screening Complete  ?Medication Review Oceanographer) Complete  ? ? ?

## 2021-06-07 NOTE — Progress Notes (Signed)
?NAME:  Billy Cherry, MRN:  [id number], DOB:  01-Jan-1971, LOS: 69 ?ADMISSION DATE:  03/30/2021, CONSULTATION DATE:  1/19 ?REFERRING MD:  Parashos, CHIEF COMPLAINT:  Found down, cardiac arrest  ? ?Brief Pt Description / Synopsis:  ?51 year old male with out-of-hospital V. fib cardiac arrest in setting of respiratory arrest due to suspected angioedema from ACE inhibitor leading to asphyxiation.  Concern on his 12 lead EKG for anterolateral ST elevation so he was taken to the cath lab where his study showed no significant coronary artery disease and an LVEF < 20%  Now with anoxic brain injury. ? ?Pertinent  Medical History  ?Hypertension ? ?Significant Hospital Events: ?Including procedures, antibiotic start and stop dates in addition to other pertinent events   ?1/19 admission, left heart cath without significant CAD.  Concern for anoxic brain injury ?1/20: MRI pending, Neuro following ?1/21: MRI shows areas of hypoxic-ischemic injury bilaterally. Fevered; cultures obtained. ?1/23 severe brain damage ?1/24 evidence of severe brain damage, remains critically ill ?1/25: Plan for PEG today. Neuro exam unchanged.  Does exhibit new eyelid tremor like movements and lip/jaw tremor like vs clonus movement.  Bolused Keppra and increased maintenance dose.  Repeat EEG. CTH: concerning for progression of anoxic injury.  ?1/26: Repeat EEG unchanged and continues to show myoclonic seizures despite addition of Valproic acid yesterday. transfer to [facility] for continuous EEG ?1/28: Continues to have myoclonic sz on EEG ?1/29 No acute issues overnight ?2/3 no meaningful neuro recovery. Diuresed ?2/5 unchanged ?2/6 New fever to 102, WBC 16K - treated with vancomycin for 7d based on cultures.  ?2/11: percutaneous tracheostomy ?2/13 >2/20 on trach collar. No vent.  ?2/20 Trach change to 6 cuffless ?2/27 status unchanged ?3/13 unchanged ?3/20 due for trach exchange given continue secretions (although improved) will stick to 6 cuffless  for exchange  ?06/05/2021 Continued thick  secretions, scant blood tinged 4/3/am ?06/06/2021 CT of the chest with new PE and haziness consistent with pneumonia ? ?Interim History / Subjective:  ? ?No significant change.  Doubt this is a thromboembolic event. ?Objective   ?Blood pressure (!) 142/92, pulse 96, temperature 98.9 ?F (37.2 ?C), temperature source Axillary, resp. rate (!) 26, height 5\' 10"  (1.778 m), weight 91 kg, SpO2 94 %. ?   ?FiO2 (%):  [28 %] 28 %  ? ?Intake/Output Summary (Last 24 hours) at 06/07/2021 1104 ?Last data filed at 06/07/2021 08/07/2021 ?Gross per 24 hour  ?Intake 545 ml  ?Output 1250 ml  ?Net -705 ml  ? ? ?Filed Weights  ? 06/04/21 0100 06/05/21 0100 06/07/21 0400  ?Weight: 96 kg 93 kg 91 kg  ? ?Examination:  ?General: Lying in bed poorly in ?HEENT: MM pink/moist active trach collar home #6 cuffless trach ?Neuro: Does not follow commands ?CV: Heart sounds regular ?PULM: Coarse rhonchi bilaterally copious secretions are noted ?GI: soft, bsx4 active  ?GU: Amber urine ?Extremities: warm/dry, 2+ edema  ?Skin: no rashes or lesions ? ? ? ?Ancillary tests personally reviewed:    ? ?Assessment & Plan:  ?OOH Vfib arrest ?Post arrest anoxic brain injury, severe ?Post arrest myoclonus improved with klonopin, no AEDs recommended ?Post arrest respiratory failure with prolonged mechanical ventilation s/p tracheostomy ?Post arrest cardiomyopathy ?Post arrest AKI, improved/stable ?HTN- stable on current regimen ?HCAP w/ MSSA- ABX > 2/21 ?Likely GPC bacteremia ?06/05/2021 new onset of tachycardia, increased work of breathing, CT of the chest reveals pulmonary embolism along with haziness consistent with pneumonia. ? ? ?Plan:  ?Most likely this is aspiration ?Consider changing antibiotics  to Unasyn ?Follow culture data ?Review CT scan makes likelihood of PE unlikely therefore consider going back to Eliquis.  And stopping heparin ? ? ? ? ? ? ? ? ?Best Practice (right click and "Reselect all SmartList Selections" daily)   ?Per primary team ? ?Brett Canales Tykesha Konicki ACNP ?Acute Care Nurse Practitioner ?[facility] ?Please consult Amion ?06/07/2021, 11:04 AM ? ? ? ? ? ? ? ? ? ? ? ? ? ? ? ? ? ? ? ?

## 2021-06-07 NOTE — Progress Notes (Signed)
?PROGRESS NOTE ? ? ? ?Billy Cherry  QXI:[id number] DOB: 1970/09/06 DOA: 03/30/2021 ?PCP: Patient, No Pcp Per (Inactive)  ? ? ?Brief Narrative:  ?51 years old male with past medical history of hypertension was brought into the hospital after being found unresponsive by his wife.   ?1/19 admission, left heart cath without significant CAD.  Concern for anoxic brain injury ?1/21: MRI shows areas of hypoxic-ischemic injury bilaterally. Fevered; cultures obtained. ?1/23 severe brain damage ?1/25: PEG . Neuro exam unchanged.  Does exhibit new eyelid tremor like movements and lip/jaw tremor like vs clonus movement.  Bolused Keppra and increased maintenance dose.  Repeat EEG. CTH: concerning for progression of anoxic injury.  ?1/26: Repeat EEG unchanged and continues to show myoclonic seizures despite addition of Valproic acid yesterday. transfer to [name] from [place] for continuous EEG ?1/28: Continues to have myoclonic sz on EEG ?2/3 no meaningful neuro recovery. Diuresed ?2/6 New fever to 102, WBC 16K - treated with vancomycin for 7d based on cultures.  ?2/11: percutaneous tracheostomy ?2/13 >2/20 on trach collar. No vent.  ?2/20 Trach change to 6 cuffless ?2/28: fever ?3/13: eliquis started ?4/3: fever ?06/06/2021 CT of the chest with new PE and haziness consistent with pneumonia ?  ? ? ?Assessment and Plan: ?* Cardiac arrest [facility]) ?Postarrest anoxic encephalopathy ?Postarrest respiratory failure s/p TC ?S/P PEG tube placement ?Postarrest myoclonus: ?2D echocardiogram showed reduced LV function at <20%.  Now status post trach and PEG.   ?Remains obtunded with anoxic encephalopathy-on Keppra, for myoclonus and can increase Klonopin for breakthrough myoclonus.   ?At this time, plan is home with home health ?Patient's wife is trying to arrange adequate care at home ?He will need around-the-clock care.  ?Enteral tube feeding trach kit and supplies to be arranged. PCCM managing tracheostomy. ? ?Anoxic encephalopathy  ([place]) ?Continue supportive care.   ?Patient currently keeps his eyes open, but does not answer questions or follow commands ? ?Acute respiratory failure ([place]) ?Status post trach collar.  Continue supportive care ? ?RLL pneumonia ?Previously had completed vancomycin for MSSA and [name] in early march.  Received more than 7-day course of IV Fortaz. ? ?4/3: CT chest was ordered that indicated an area of alveolar infiltration that may represent pneumonia.  Tracheal aspirate has been sent for culture.  He has been started on broad-spectrum antibiotics.  Continue to follow fever curve.  Suspect this is related to aspiration, which he would be at increased risk for since he is PEG tube dependent.  Pulmonology following- recommend changing to unasyn ? ? ?DVT (deep venous thrombosis) ([place]) ?Duplex ultrasound of lower extremity on 05/15/2021 showed age indeterminate deep vein thrombosis involving the left femoral vein, left proximal profunda vein, left posterior tibial veins, left popliteal vein, and left peroneal veins.   ?-treated with eliquis, but with new CT chest findings, has been changed to lovenox 4/4 ? ?Dysphagia ?Continue PEG tube feeding.  Tolerating. ?-aspiration precautions ? ?Pulmonary embolus ([place]) ?Noted to have possible acute on chronic pulmonary embolism on CT.  Also have some evidence of possible pulmonary hypertension.  He has been on Eliquis for almost a 3 weeks, for DVT that was diagnosed on 3/13.   ?-Eliquis changed to Lovenox-- suspect can be changed back ? 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 Subcutaneous fat may be visible but bone, tendon or muscle are NOT exposed. Pink, red (Active)  ?05/24/21 2250  ?Location: Penis  ?Location Orientation: Lateral  ?  Staging: Stage 3 -  Full thickness tissue loss. Subcutaneous fat may be visible but bone, tendon or muscle are NOT exposed.  ?Wound Description (Comments): Pink, red   ?Present on Admission: No  ?   ?Pressure Injury 05/25/21 Heel Posterior;Right Deep Tissue Pressure Injury - Purple or maroon localized area of discolored intact skin or blood-filled blister due to damage of underlying soft tissue from pressure and/or shear. 4CM X 4CM (Active)  ?05/25/21 0800  ?Location: Heel  ?Location Orientation: Posterior;Right  ?Staging: Deep Tissue Pressure Injury - Purple or maroon localized area of discolored intact skin or blood-filled blister due to damage of underlying soft tissue from pressure and/or shear.  ?Wound Description (Comments): 4CM X 4CM  ?Present on Admission:   ?   ?Pressure Injury 05/25/21 Foot Right Deep Tissue Pressure Injury - Purple or maroon localized area of discolored intact skin or blood-filled blister due to damage of underlying soft tissue from pressure and/or shear. 2CM X4 CM (Active)  ?05/25/21 0800  ?Location: Foot  ?Location Orientation: Right (metatarsal heads)  ?Staging: Deep Tissue Pressure Injury - Purple or maroon localized area of discolored intact skin or blood-filled blister due to damage of underlying soft tissue from pressure and/or shear. (metatarsal heads)  ?Wound Description (Comments): 2CM X4 CM  ?Present on Admission:   ? ?Continue wound care ? ?HTN (hypertension) ?Continue Coreg, Isordil, amlodipine, Cardura, hydralazine and PRN Labetalol. Blood pressure stable ? ?HFrEF (heart failure with reduced ejection fraction) ([place]) ?2D echocardiogram-LVEF < 20%, continue coreg, imdur, amlodipine, doxazosin, hydralazine, labetalol as needed  ? ?Hypernatremia ?Resolved with free water flushes ? ?Myoclonus ?Seen by neurology.  Continue current AED with Klonopin, Keppra. Per neuro notes, suppressing his myoclonus will not ultimately change the prognosis  ? ? ? ?Hyperkalemia ?-hold aldactone today ?-resume when able ?-recheck K in AM ? ? ? ? ? ?DVT prophylaxis:  ? ?  Code Status: Full Code ?Family Communication:  ? ?Disposition Plan:  ?Level of care:  Telemetry Medical ?Status is: Inpatient ?Remains inpatient appropriate because: needs safe d/c plan ?  ? ?Consultants:  ?PCCM ?Neurology ?Palliative care ?ID ?IR ?GI ?cards ? ? ?Subjective: ?In bed, fever ?Non-verbal ? ?Objective: ?Vitals:  ? 06/07/21 0747 06/07/21 0752 06/07/21 1114 06/07/21 1158  ?BP: (!) 142/92  116/86   ?Pulse: 100 96 (!) 103 (!) 105  ?Resp: (!) 29 (!) 26 (!) 35 (!) 30  ?Temp: 98.9 ?F (37.2 ?C)  100.2 ?F (37.9 ?C)   ?TempSrc: Axillary  Axillary   ?SpO2: 97% 94% 97% 99%  ?Weight:      ?Height:      ? ? ?Intake/Output Summary (Last 24 hours) at 06/07/2021 1251 ?Last data filed at 06/07/2021 0240 ?Gross per 24 hour  ?Intake 545 ml  ?Output 1250 ml  ?Net -705 ml  ? ?Filed Weights  ? 06/04/21 0100 06/05/21 0100 06/07/21 0400  ?Weight: 96 kg 93 kg 91 kg  ? ? ?Examination: ? ? ?General: Appearance:     ?Overweight male in no acute distress  ?   ?Lungs:     respirations unlabored, trach in place  ?Heart:    Tachycardic.  ?  ?MS:   All extremities are intact.  ?  ?Neurologic:   In bed, eyes closed, did not follow commands  ?  ? ? ? ?Data Reviewed: I have personally reviewed following labs and imaging studies ? ?CBC: ?Recent Labs  ?Lab 06/01/21 ?0505 06/02/21 ?0416 06/05/21 ?9735 06/06/21 ?3299 06/07/21 ?0405  ?WBC 12.4* 10.6* 11.8* 10.8*  10.0  ?HGB 11.9* 11.5* 12.7* 12.0* 11.3*  ?HCT 36.2* 36.0* 39.1 36.7* 35.2*  ?MCV 93.1 95.2 95.4 95.6 96.4  ?PLT 175 217 238 230 218  ? ?Basic Metabolic Panel: ?Recent Labs  ?Lab 06/01/21 ?0505 06/02/21 ?0416 06/05/21 ?3291 06/06/21 ?9166 06/07/21 ?0405  ?NA 134* 136 135 137 136  ?K 4.8 4.4 4.5 4.5 5.3*  ?CL 97* 96* 97* 99 99  ?CO2 28 32 _0 ?GLUCOSE 100* 119* 127* 90 98  ?BUN 32* 30* 32* 37* 40*  ?CREATININE 0.71 0.73 0.60* 0.71 0.75  ?CALCIUM 8.6* 8.7* 9.1 8.9 8.7*  ? ?GFR: ?Estimated Creatinine Clearance: 125.3 mL/min (by C-G formula based on SCr of 0.75 mg/dL). ?Liver Function Tests: ?Recent Labs  ?Lab 06/01/21 ?0505  ?AST 22  ?ALT 12  ?ALKPHOS 57  ?BILITOT 0.7   ?PROT 6.1*  ?ALBUMIN 2.0*  ? ?No results for input(s): LIPASE, AMYLASE in the last 168 hours. ?No results for input(s): AMMONIA in the last 168 hours. ?Coagulation Profile: ?No results for input(s): INR, PROTIME in

## 2021-06-08 LAB — BASIC METABOLIC PANEL
Anion gap: 9 (ref 5–15)
BUN: 35 mg/dL — ABNORMAL HIGH (ref 6–20)
CO2: 29 mmol/L (ref 22–32)
Calcium: 8.8 mg/dL — ABNORMAL LOW (ref 8.9–10.3)
Chloride: 100 mmol/L (ref 98–111)
Creatinine, Ser: 0.61 mg/dL (ref 0.61–1.24)
GFR, Estimated: >60 mL/min (ref >60)
Glucose, Bld: 106 mg/dL — ABNORMAL HIGH (ref 70–99)
Potassium: 4.9 mmol/L (ref 3.5–5.1)
Sodium: 138 mmol/L (ref 135–145)

## 2021-06-08 LAB — CBC
HCT: 38.7 % — ABNORMAL LOW (ref 39.0–52.0)
Hemoglobin: 12.3 g/dL — ABNORMAL LOW (ref 13.0–17.0)
MCH: 30.4 pg (ref 26.0–34.0)
MCHC: 31.8 g/dL (ref 30.0–36.0)
MCV: 95.6 fL (ref 80.0–100.0)
Platelets: 227 10*3/uL (ref 150–400)
RBC: 4.05 MIL/uL — ABNORMAL LOW (ref 4.22–5.81)
RDW: 14.9 % (ref 11.5–15.5)
WBC: 10.1 10*3/uL (ref 4.0–10.5)
nRBC: 0 % (ref 0.0–0.2)

## 2021-06-08 LAB — CULTURE, RESPIRATORY W GRAM STAIN: Culture: NORMAL

## 2021-06-08 MED ORDER — FUROSEMIDE 10 MG/ML IJ SOLN
20.0000 mg | Freq: Once | INTRAMUSCULAR | Status: AC
  Administered 2021-06-08: 20 mg via INTRAVENOUS
  Filled 2021-06-08: qty 2

## 2021-06-08 MED ORDER — METOCLOPRAMIDE HCL 5 MG/ML IJ SOLN
5.0000 mg | Freq: Three times a day (TID) | INTRAMUSCULAR | Status: DC
  Administered 2021-06-08 – 2021-06-21 (×39): 5 mg via INTRAVENOUS
  Filled 2021-06-08 (×39): qty 2

## 2021-06-08 MED ORDER — LABETALOL HCL 5 MG/ML IV SOLN
10.0000 mg | Freq: Once | INTRAVENOUS | Status: AC
Start: 2021-06-08 — End: 2021-06-08
  Administered 2021-06-08: 10 mg via INTRAVENOUS
  Filled 2021-06-08: qty 4

## 2021-06-08 MED ORDER — SPIRONOLACTONE 12.5 MG HALF TABLET
12.5000 mg | ORAL_TABLET | Freq: Every day | ORAL | Status: DC
  Administered 2021-06-09 – 2021-06-10 (×2): 12.5 mg via ORAL
  Filled 2021-06-08 (×2): qty 1

## 2021-06-08 MED ORDER — LABETALOL HCL 5 MG/ML IV SOLN
10.0000 mg | INTRAVENOUS | Status: DC | PRN
  Administered 2021-06-08 – 2021-06-12 (×2): 10 mg via INTRAVENOUS
  Filled 2021-06-08 (×2): qty 4

## 2021-06-08 MED ORDER — LABETALOL HCL 5 MG/ML IV SOLN
5.0000 mg | INTRAVENOUS | Status: DC | PRN
  Administered 2021-06-08 (×2): 5 mg via INTRAVENOUS
  Filled 2021-06-08 (×2): qty 4

## 2021-06-08 NOTE — Progress Notes (Signed)
Pt BP at 0300 was 164/115,  5 mg  IV of PRN labetalol was given.rechecked pt BP and it was 173/122. Elsie Lincoln, MD notified  who adjusted the pt  PRN Labetalol  from 5 mg to 10 mg IV PRN. ?

## 2021-06-08 NOTE — TOC Progression Note (Addendum)
Transition of Care (TOC) - Progression Note  ? ? ?Patient Details  ?Name: Billy Cherry ?MRN: [id number] ?Date of Birth: 02/07/1971 ? ?Transition of Care (TOC) CM/SW Contact  ?Zenon Mayo, RN ?Phone Number: ?06/08/2021, 11:32 AM ? ?Clinical Narrative:    ?NCM received call from [place] with [facility], he states he received my message that patient was approved for Medicaid.  He will be sending over a new form for MD to sign so that they can get authorization going.  He states with the full Medicaid he will get 84 hrs per week of care, that's 12 hrs per day.  [name] states that it will take up to 14 days to get the aughorization, so could be until 4/20 or could get sooner. This NCM will ask Dr. Eliseo Squires to sign form.  ? ? ?NCM contacted wife informed her to call respiratory  dept on next week [phone].   THey  need to set up some times if possible to be get her comfortable with trach care, suctioning and answerr andy questions she may have,  wife took down the phone number and states she will call. ? ?Expected Discharge Plan: [place] ?Barriers to Discharge: Barriers Resolved ? ?Expected Discharge Plan and Services ?Expected Discharge Plan: [place] ?In-house Referral: Hospice / Palliative Care ?Discharge Planning Services: CM Consult ?Post Acute Care Choice: Long Term Acute Care (LTAC) ?Living arrangements for the past 2 months: [place] ?                ?DME Arranged: N/A ?DME Agency: [employer] ?  ?  ?  ?HH Arranged: Nurse's Aide ?[place] Agency: [place] ?  ?  ?Representative spoke with at [place]: [name] ? ? ?Social Determinants of Health (SDOH) Interventions ?  ? ?Readmission Risk Interventions ? ?  04/14/2021  ? 11:14 AM  ?Readmission Risk Prevention Plan  ?Transportation Screening Complete  ?[place] or Home Care Consult Complete  ?Social Work Consult for [place] Planning/Counseling Complete  ?Palliative Care Screening Complete  ?Medication Review Designer, fashion/clothing) Complete  ? ? ?

## 2021-06-08 NOTE — Progress Notes (Signed)
Suctioned multiple times. Cleaned from medium soft stool. Repositioned to left side. Linens and gown changed.  ?

## 2021-06-08 NOTE — Progress Notes (Signed)
[facility] Care team following patient.  Will continue to follow progression.  Spoke with nurse today about discharge plans for patient. ?

## 2021-06-08 NOTE — Progress Notes (Signed)
Education provided to wife on tube feeding. Wife performed tube feeding @ bedside. Will continue to monitor. ?

## 2021-06-08 NOTE — Progress Notes (Addendum)
?PROGRESS NOTE ? ? ? ?Billy Cherry  [id number] DOB: [birth year] DOA: 03/30/2021 ?PCP: Patient, No Pcp Per (Inactive)  ? ? ?Brief Narrative:  ?51 years old male with past medical history of hypertension was brought into the hospital after being found unresponsive by his wife.   ?1/19 admission, left heart cath without significant CAD.  Concern for anoxic brain injury ?1/21: MRI shows areas of hypoxic-ischemic injury bilaterally. Fevered; cultures obtained. ?1/23 severe brain damage ?1/25: PEG . Neuro exam unchanged.  Does exhibit new eyelid tremor like movements and lip/jaw tremor like vs clonus movement.  Bolused Keppra and increased maintenance dose.  Repeat EEG. CTH: concerning for progression of anoxic injury.  ?1/26: Repeat EEG unchanged and continues to show myoclonic seizures despite addition of Valproic acid yesterday. transfer to [name] from [place] for continuous EEG ?1/28: Continues to have myoclonic sz on EEG ?2/3 no meaningful neuro recovery. Diuresed ?2/6 New fever to 102, WBC 16K - treated with vancomycin for 7d based on cultures.  ?2/11: percutaneous tracheostomy ?2/13 >2/20 on trach collar. No vent.  ?2/20 Trach change to 6 cuffless ?2/28: fever ?3/13: eliquis started ?4/3: fever ?06/06/2021 CT of the chest with new PE and haziness consistent with pneumonia ?  ? ? ?Assessment and Plan: ?* Cardiac arrest [facility]) ?Postarrest anoxic encephalopathy ?Postarrest respiratory failure s/p TC ?S/P PEG tube placement ?Postarrest myoclonus: ?2D echocardiogram showed reduced LV function at <20%.  Now status post trach and PEG.   ?Remains obtunded with anoxic encephalopathy-on Keppra, for myoclonus and can increase Klonopin for breakthrough myoclonus.   ?At this time, plan is home with home health ?Patient's wife is trying to arrange adequate care at home ?He will need around-the-clock care.  ?Enteral tube feeding trach kit and supplies to be arranged. PCCM managing tracheostomy. ? ?Anoxic encephalopathy  ([place]) ?Continue supportive care.   ?Patient currently keeps his eyes open, but does not answer questions or follow commands ? ?Acute respiratory failure ([place]) ?Status post trach collar.  Continue supportive care ? ?RLL pneumonia ?Previously had completed vancomycin for MSSA and [name] in early march.  Received more than 7-day course of IV Fortaz. ? ?4/3: CT chest was ordered that indicated an area of alveolar infiltration that may represent pneumonia.   ?-Tracheal aspirate has been sent for culture.   ?-Suspect this is related to aspiration, which he would be at increased risk for since he is PEG tube dependent.  Pulmonology following- recommend changing to unasyn (changed 4/5) ? ? ?DVT (deep venous thrombosis) ([place]) ?Duplex ultrasound of lower extremity on 05/15/2021 showed age indeterminate deep vein thrombosis involving the left femoral vein, left proximal profunda vein, left posterior tibial veins, left popliteal vein, and left peroneal veins.   ?-treated with eliquis, but with new CT chest findings, has been changed to lovenox 4/4 ? ?Dysphagia ?Continue PEG tube feeding.  Tolerating but high risk of aspiration ?-aspiration precautions ? ?Pulmonary embolus ([place]) ?Noted to have possible acute on chronic pulmonary embolism on CT.  Also have some evidence of possible pulmonary hypertension.  He has been on Eliquis for almost a 3 weeks, for DVT that was diagnosed on 3/13.   ?-Eliquis changed to Lovenox-- suspect can be changed back ? 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 Subcutaneous fat may be visible but bone, tendon or muscle are NOT exposed. Pink, red (Active)  ?05/24/21 2250  ?Location: Penis  ?Location Orientation: Lateral  ?Staging: Stage 3 -  Full thickness tissue loss. Subcutaneous fat may be visible but bone, tendon or muscle are NOT exposed.  ?Wound Description (Comments): Pink, red  ?Present on Admission: No  ?    ?Pressure Injury 05/25/21 Heel Posterior;Right Deep Tissue Pressure Injury - Purple or maroon localized area of discolored intact skin or blood-filled blister due to damage of underlying soft tissue from pressure and/or shear. 4CM X 4CM (Active)  ?05/25/21 0800  ?Location: Heel  ?Location Orientation: Posterior;Right  ?Staging: Deep Tissue Pressure Injury - Purple or maroon localized area of discolored intact skin or blood-filled blister due to damage of underlying soft tissue from pressure and/or shear.  ?Wound Description (Comments): 4CM X 4CM  ?Present on Admission:   ?   ?Pressure Injury 05/25/21 Foot Right Deep Tissue Pressure Injury - Purple or maroon localized area of discolored intact skin or blood-filled blister due to damage of underlying soft tissue from pressure and/or shear. 2CM X4 CM (Active)  ?05/25/21 0800  ?Location: Foot  ?Location Orientation: Right (metatarsal heads)  ?Staging: Deep Tissue Pressure Injury - Purple or maroon localized area of discolored intact skin or blood-filled blister due to damage of underlying soft tissue from pressure and/or shear. (metatarsal heads)  ?Wound Description (Comments): 2CM X4 CM  ?Present on Admission:   ? ?Continue wound care ? ?HTN (hypertension) ?Continue Coreg, Isordil, amlodipine, Cardura, hydralazine and PRN Labetalol. Blood pressure elevated on 4/6- unclear reason ? ?HFrEF (heart failure with reduced ejection fraction) ([place]) ?2D echocardiogram-LVEF < 20%, continue coreg, imdur, amlodipine, doxazosin, hydralazine, labetalol as needed  ? ?Hypernatremia ?Resolved with free water flushes ? ?Myoclonus ?Seen by neurology.  Continue current AED with Klonopin, Keppra. Per neuro notes, suppressing his myoclonus will not ultimately change the prognosis  ? ? ? ?Hyperkalemia ?-resolved ?-resume low dose aldactone in AM ? ?Overall poor prognosis ? ? ? ? ?DVT prophylaxis:  ? ?  Code Status: Full Code ?Family Communication:  ? ?Disposition Plan:  ?Level of care:  Progressive ?Status is: Inpatient ?Remains inpatient appropriate because: needs safe d/c plan ?  ? ?Consultants:  ?PCCM ?Neurology ?Palliative care ?ID ?IR ?GI ?cards ? ? ?Subjective: ?Non-verbal-- appears uncomfortable in the bed ? ?Objective: ?Vitals:  ? 06/08/21 0816 06/08/21 1000 06/08/21 1125 06/08/21 1145  ?BP:  (!) 144/111  (!) 166/126  ?Pulse: (!) 105  (!) 105 (!) 104  ?Resp: (!) 30  (!) 33 (!) 31  ?Temp:    98.2 ?F (36.8 ?C)  ?TempSrc:    Axillary  ?SpO2: 98%  95% 92%  ?Weight:      ?Height:      ? ? ?Intake/Output Summary (Last 24 hours) at 06/08/2021 1338 ?Last data filed at 06/08/2021 1312 ?Gross per 24 hour  ?Intake 200 ml  ?Output 800 ml  ?Net -600 ml  ? ?Filed Weights  ? 06/05/21 0100 06/07/21 0400 06/08/21 0400  ?Weight: 93 kg 91 kg 92 kg  ? ? ?Examination: ? ? ?General: Appearance:     ?Ill appearing male who appears uncomfortable  ?   ?Lungs:     respirations unlabored  ?Heart:    Tachycardic.   ?MS:   All extremities are intact.  ?  ?Neurologic:   Will open eyes but not follow commands  ?  ?  ? ? ? ?Data Reviewed: I have personally reviewed following labs and imaging studies ? ?CBC: ?Recent Labs  ?Lab 06/02/21 ?0416 06/05/21 ?5102 06/06/21 ?5852 06/07/21 ?0405 06/08/21 ?0532  ?WBC 10.6* 11.8* 10.8* 10.0 10.1  ?HGB 11.5* 12.7*  12.0* 11.3* 12.3*  ?HCT 36.0* 39.1 36.7* 35.2* 38.7*  ?MCV 95.2 95.4 95.6 96.4 95.6  ?PLT 217 238 230 218 227  ? ?Basic Metabolic Panel: ?Recent Labs  ?Lab 06/02/21 ?0416 06/05/21 ?6546 06/06/21 ?5035 06/07/21 ?0405 06/08/21 ?0532  ?NA 136 135 137 136 138  ?K 4.4 4.5 4.5 5.3* 4.9  ?CL 96* 97* 99 99 100  ?CO2 32 $Remov'31 30 28 29  'ihhLAS$ ?GLUCOSE 119* 127* 90 98 106*  ?BUN 30* 32* 37* 40* 35*  ?CREATININE 0.73 0.60* 0.71 0.75 0.61  ?CALCIUM 8.7* 9.1 8.9 8.7* 8.8*  ? ?GFR: ?Estimated Creatinine Clearance: 125.9 mL/min (by C-G formula based on SCr of 0.61 mg/dL). ?Liver Function Tests: ?No results for input(s): AST, ALT, ALKPHOS, BILITOT, PROT, ALBUMIN in the last 168 hours. ? ?No results for  input(s): LIPASE, AMYLASE in the last 168 hours. ?No results for input(s): AMMONIA in the last 168 hours. ?Coagulation Profile: ?No results for input(s): INR, PROTIME in the last 168 hours. ?Cardiac Enzymes: ?No results

## 2021-06-08 NOTE — Progress Notes (Signed)
Pt BP at 2300 was 139/110. The parameter for BP was  for SBP greater than 170.  At midnight, patient  BP was 151/113. On call provider notified to include the parameter for DBP as well. MD changed the parameter to include both  SBP and DBP. Checked pt BP and it was 164/130.  5 mg of IV Labetalol was given. Rechecked pt BP at 0155 and it was 183/124.  Upon assessment, pt did not appear in  pain or distress. On call provider updated regarding pt BP increasing after the PRN. Provider added  Labetalol 10 mg IV  once to be given. ?

## 2021-06-09 ENCOUNTER — Inpatient Hospital Stay (HOSPITAL_COMMUNITY): Payer: BC Managed Care – PPO

## 2021-06-09 LAB — BASIC METABOLIC PANEL
Anion gap: 7 (ref 5–15)
BUN: 35 mg/dL — ABNORMAL HIGH (ref 6–20)
CO2: 31 mmol/L (ref 22–32)
Calcium: 8.8 mg/dL — ABNORMAL LOW (ref 8.9–10.3)
Chloride: 99 mmol/L (ref 98–111)
Creatinine, Ser: 0.67 mg/dL (ref 0.61–1.24)
GFR, Estimated: >60 mL/min (ref >60)
Glucose, Bld: 105 mg/dL — ABNORMAL HIGH (ref 70–99)
Potassium: 4.5 mmol/L (ref 3.5–5.1)
Sodium: 137 mmol/L (ref 135–145)

## 2021-06-09 LAB — CBC
HCT: 35.9 % — ABNORMAL LOW (ref 39.0–52.0)
Hemoglobin: 11.7 g/dL — ABNORMAL LOW (ref 13.0–17.0)
MCH: 30.9 pg (ref 26.0–34.0)
MCHC: 32.6 g/dL (ref 30.0–36.0)
MCV: 94.7 fL (ref 80.0–100.0)
Platelets: 235 10*3/uL (ref 150–400)
RBC: 3.79 MIL/uL — ABNORMAL LOW (ref 4.22–5.81)
RDW: 15 % (ref 11.5–15.5)
WBC: 12.5 10*3/uL — ABNORMAL HIGH (ref 4.0–10.5)
nRBC: 0 % (ref 0.0–0.2)

## 2021-06-09 MED ORDER — ISOSORBIDE DINITRATE 10 MG PO TABS
30.0000 mg | ORAL_TABLET | Freq: Three times a day (TID) | ORAL | Status: DC
  Administered 2021-06-09 – 2021-10-04 (×336): 30 mg
  Filled 2021-06-09 (×14): qty 3
  Filled 2021-06-09: qty 1
  Filled 2021-06-09 (×4): qty 3
  Filled 2021-06-09: qty 1
  Filled 2021-06-09: qty 3
  Filled 2021-06-09: qty 1
  Filled 2021-06-09 (×53): qty 3
  Filled 2021-06-09: qty 1
  Filled 2021-06-09 (×9): qty 3
  Filled 2021-06-09: qty 1
  Filled 2021-06-09 (×2): qty 3
  Filled 2021-06-09: qty 1
  Filled 2021-06-09 (×5): qty 3
  Filled 2021-06-09: qty 1
  Filled 2021-06-09 (×18): qty 3
  Filled 2021-06-09: qty 1
  Filled 2021-06-09 (×42): qty 3
  Filled 2021-06-09: qty 1
  Filled 2021-06-09 (×30): qty 3
  Filled 2021-06-09: qty 1
  Filled 2021-06-09 (×26): qty 3
  Filled 2021-06-09: qty 1
  Filled 2021-06-09 (×29): qty 3
  Filled 2021-06-09: qty 1
  Filled 2021-06-09 (×4): qty 3
  Filled 2021-06-09 (×2): qty 1
  Filled 2021-06-09: qty 3
  Filled 2021-06-09: qty 1
  Filled 2021-06-09 (×23): qty 3
  Filled 2021-06-09: qty 1
  Filled 2021-06-09 (×14): qty 3
  Filled 2021-06-09: qty 1
  Filled 2021-06-09 (×34): qty 3
  Filled 2021-06-09: qty 1
  Filled 2021-06-09 (×2): qty 3
  Filled 2021-06-09 (×2): qty 1
  Filled 2021-06-09 (×17): qty 3

## 2021-06-09 NOTE — Progress Notes (Signed)
? ?PROGRESS NOTE ? ? ? ?Kenzel Ruesch  QPR:[id number] DOB: 05/31/1970 DOA: 03/30/2021 ?PCP: Patient, No Pcp Per (Inactive) ? ? ?Brief Narrative: ?51 years old male with past medical history of hypertension was brought into the hospital after being found unresponsive by his wife.   ?1/19 admission, left heart cath without significant CAD.  Concern for anoxic brain injury ?1/21: MRI shows areas of hypoxic-ischemic injury bilaterally. Fevered; cultures obtained. ?1/23 severe brain damage ?1/25: PEG . Neuro exam unchanged.  Does exhibit new eyelid tremor like movements and lip/jaw tremor like vs clonus movement.  Bolused Keppra and increased maintenance dose.  Repeat EEG. CTH: concerning for progression of anoxic injury.  ?1/26: Repeat EEG unchanged and continues to show myoclonic seizures despite addition of Valproic acid yesterday. transfer to [name] from [place] for continuous EEG ?1/28: Continues to have myoclonic sz on EEG ?2/3 no meaningful neuro recovery. Diuresed ?2/6 New fever to 102, WBC 16K - treated with vancomycin for 7d based on cultures.  ?2/11: percutaneous tracheostomy ?2/13 >2/20 on trach collar. No vent.  ?2/20 Trach change to 6 cuffless ?2/28: fever ?3/13: eliquis started ?4/3: fever ?06/06/2021 CT of the chest with new PE and haziness consistent with pneumonia ? ? ?Assessment and Plan: ?* Cardiac arrest [facility]) ?Postarrest anoxic encephalopathy ?Postarrest respiratory failure s/p TC ?S/P PEG tube placement ?Postarrest myoclonus: ?2D echocardiogram showed reduced LV function at <20%.  Now status post trach and PEG.   ?Remains obtunded with anoxic encephalopathy-on Keppra, for myoclonus and can increase Klonopin for breakthrough myoclonus.   ?At this time, plan is home with home health ?Patient's wife is trying to arrange adequate care at home ?He will need around-the-clock care.  ?Enteral tube feeding trach kit and supplies to be arranged. PCCM managing tracheostomy. ? ?Anoxic encephalopathy  ([place]) ?Continue supportive care.   ?Patient currently keeps his eyes open, but does not answer questions or follow commands ? ?Acute respiratory failure ([place]) ?Status post trach collar.  Continue supportive care ? ?RLL pneumonia ?Previously had completed vancomycin for MSSA and [name] in early march.  Received more than 7-day course of IV Fortaz. ?On 4/3, CT chest was ordered that indicated an area of alveolar infiltration that may represent pneumonia.  Tracheal aspirate has been sent for culture and is significant for normal respiratory flora. Concerning for aspiration. ?-Continue Unasyn ? ?DVT (deep venous thrombosis) ([place]) ?Duplex ultrasound of lower extremity on 05/15/2021 showed age indeterminate deep vein thrombosis involving the left femoral vein, left proximal profunda vein, left posterior tibial veins, left popliteal vein, and left peroneal veins. Initially on Eliquis but now changed to Lovenox ?-Continue Lovenox ? ?Dysphagia ?Continue PEG tube feeding.  Tolerating but high risk of aspiration ?-Aspiration precautions ? ?Pulmonary embolus ([place]) ?Noted to have possible acute on chronic pulmonary embolism on CT.  Also have some evidence of possible pulmonary hypertension.  Patient was previously managed on Eliquis which is transitioned to Lovenox ?-Continue Lovenox ? ?Pressure injury of skin ?Lateral penis, posterior right heel, right foot. Not present on admission. ? ?HTN (hypertension) ?Blood pressure elevated today. ?-Continue Coreg, amlodipine, Cardura, hydralazine and PRN Labetalol ?-Increase to isosorbide dinitrate 30 mg TID ? ?HFrEF (heart failure with reduced ejection fraction) ([place]) ?2D echocardiogram-LVEF < 20%,  ?-Continue coreg, imdur, spironolactone ? ?Myoclonus ?Seen by neurology.  Continue current AED with Klonopin, Keppra. Per neuro notes, suppressing his myoclonus will not ultimately change the prognosis  ? ?Hypernatremia-resolved as of 06/09/2021 ?Resolved with free water  flushes ? ? ? ?  DVT prophylaxis: Lovenox ?Code Status:   Code Status: Full Code ?Family Communication: None at bedside ?Disposition Plan: Discharge home pending insurance, home health/services available ? ? ?Consultants:  ?Infectious disease ?Palliative care ?Neurology ?Cardiology ?Critical care ? ?Procedures:  ? ? ?Antimicrobials: ?  ? ? ?Subjective: ?Non-verbal ? ?Objective: ?BP (!) 145/107 (BP Location: Right Arm)   Pulse (!) 105   Temp 99.1 ?F (37.3 ?C) (Oral)   Resp (!) 24   Ht $R'5\' 10"'sE$  (1.778 m)   Wt 93 kg   SpO2 99%   BMI 29.42 kg/m?  ? ?Examination: ? ?General exam: Appears calm and comfortable. Chronically ill appearing. ?Respiratory system: Diffuse rhonchi. Respiratory effort normal. ?Cardiovascular system: S1 & S2 heard, RRR. No murmurs, rubs, gallops or clicks. ?Gastrointestinal system: Abdomen is nondistended, soft and nontender. No organomegaly or masses felt. Normal bowel sounds heard. ?Central nervous system: Eyes are open but does not track. Not oriented.  ?Musculoskeletal:  No calf tenderness ?Skin: No cyanosis. No rashes ? ? ?Data Reviewed: I have personally reviewed following labs and imaging studies ? ?CBC ?Lab Results  ?Component Value Date  ? WBC 12.5 (H) 06/09/2021  ? RBC 3.79 (L) 06/09/2021  ? HGB 11.7 (L) 06/09/2021  ? HCT 35.9 (L) 06/09/2021  ? MCV 94.7 06/09/2021  ? MCH 30.9 06/09/2021  ? PLT 235 06/09/2021  ? MCHC 32.6 06/09/2021  ? RDW 15.0 06/09/2021  ? LYMPHSABS 1.6 05/08/2021  ? MONOABS 1.3 (H) 05/08/2021  ? EOSABS 0.1 05/08/2021  ? BASOSABS 0.0 05/08/2021  ? ? ? ?Last metabolic panel ?Lab Results  ?Component Value Date  ? NA 137 06/09/2021  ? K 4.5 06/09/2021  ? CL 99 06/09/2021  ? CO2 31 06/09/2021  ? BUN 35 (H) 06/09/2021  ? CREATININE 0.67 06/09/2021  ? GLUCOSE 105 (H) 06/09/2021  ? GFRNONAA >60 06/09/2021  ? CALCIUM 8.8 (L) 06/09/2021  ? PHOS 3.8 04/17/2021  ? PROT 6.1 (L) 06/01/2021  ? ALBUMIN 2.0 (L) 06/01/2021  ? BILITOT 0.7 06/01/2021  ? ALKPHOS 57 06/01/2021  ? AST 22  06/01/2021  ? ALT 12 06/01/2021  ? ANIONGAP 7 06/09/2021  ? ? ?GFR: ?Estimated Creatinine Clearance: 126.6 mL/min (by C-G formula based on SCr of 0.67 mg/dL). ? ?Recent Results (from the past 240 hour(s))  ?Culture, blood (routine x 2)     Status: None (Preliminary result)  ? Collection Time: 06/06/21  6:25 AM  ? Specimen: BLOOD RIGHT HAND  ?Result Value Ref Range Status  ? Specimen Description BLOOD RIGHT HAND  Final  ? Special Requests   Final  ?  BOTTLES DRAWN AEROBIC AND ANAEROBIC Blood Culture adequate volume  ? Culture   Final  ?  NO GROWTH 3 DAYS ?Performed at [facility], [place] [address], [place], [place] [address] ?  ? Report Status PENDING  Incomplete  ?Culture, blood (routine x 2)     Status: None (Preliminary result)  ? Collection Time: 06/06/21  6:38 AM  ? Specimen: BLOOD RIGHT HAND  ?Result Value Ref Range Status  ? Specimen Description BLOOD RIGHT HAND  Final  ? Special Requests   Final  ?  BOTTLES DRAWN AEROBIC AND ANAEROBIC Blood Culture adequate volume  ? Culture   Final  ?  NO GROWTH 3 DAYS ?Performed at [facility], [place] [address] [place], [place] [address] ?  ? Report Status PENDING  Incomplete  ?Culture, Respiratory w Gram Stain     Status: None  ? Collection Time: 06/06/21  9:00 AM  ? Specimen: Tracheal Aspirate; Respiratory  ?Result Value Ref Range Status  ? Specimen Description TRACHEAL ASPIRATE  Final  ? Special Requests NONE  Final  ? Gram Stain   Final  ?  MODERATE WBC PRESENT,BOTH PMN AND MONONUCLEAR ?MODERATE GRAM POSITIVE RODS ?MODERATE GRAM NEGATIVE RODS ?RARE GRAM POSITIVE COCCI IN CHAINS ?  ? Culture   Final  ?  FEW Consistent with normal respiratory flora. ?No Pseudomonas species isolated ?Performed at [facility], [place] [address], [place], [place] [address] ?  ? Report Status 06/08/2021 FINAL  Final  ?  ? ? ?Radiology Studies: ?DG CHEST PORT 1 VIEW ? ?Result Date: 06/09/2021 ?CLINICAL DATA:  Dyspnea EXAM: PORTABLE CHEST 1 VIEW COMPARISON:  Two days ago FINDINGS:  Improved inflation although still low volume chest. Stable cardiomegaly. Stable mediastinal contours. A tracheostomy tube is well seated. Artifact from EKG leads. IMPRESSION: Improved inflation.  No edema or focal infi

## 2021-06-09 NOTE — Progress Notes (Signed)
ANTICOAGULATION CONSULT NOTE ?Pharmacy Consult for Lovenox ?Indication: pulmonary embolus ? ?Allergies  ?Allergen Reactions  ? Lisinopril Swelling  ?  angioedema  ? ? ?Patient Measurements: ?Height:  (bed scale not working) ?Weight: 93 kg (205 lb 0.4 oz) ?IBW/kg (Calculated) : 73 ?Heparin Dosing Weight: 93 ? ?Vital Signs: ?Temp: 99.7 ?F (37.6 ?C) (04/07 0735) ?Temp Source: Oral (04/07 0735) ?BP: 181/131 (04/07 0735) ?Pulse Rate: 108 (04/07 0735) ? ?Labs: ?Recent Labs  ?  06/07/21 ?0405 06/08/21 ?0532 06/09/21 ?0321  ?HGB 11.3* 12.3* 11.7*  ?HCT 35.2* 38.7* 35.9*  ?PLT 218 227 235  ?CREATININE 0.75 0.61 0.67  ? ? ? ?Estimated Creatinine Clearance: 126.6 mL/min (by C-G formula based on SCr of 0.67 mg/dL). ? ? ?Medical History: ?Past Medical History:  ?Diagnosis Date  ? Hypertension   ? ? ?Assessment: 13 yoM status post tracheostomy and PEG tube placement found to have new PE. 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 Patient has been on full treatment dose Eliquis since 3/13 after he was found to have LE DVTs. No missed or held doses. At this time would consider this a treatment failure.  ? ?Continues on Lovenox ? ?Goal of Therapy:  ?Anti-Xa 0.6-1 ?Monitor platelets by anticoagulation protocol: Yes ?  ?Plan:  ?Lovenox 100mg  SQ BID ?F/u with PO plan ? ?Thank you ?Anette Guarneri, PharmD ?06/09/2021 8:33 AM ? ? ? ? ?

## 2021-06-10 LAB — CBC
HCT: 33.9 % — ABNORMAL LOW (ref 39.0–52.0)
Hemoglobin: 11.2 g/dL — ABNORMAL LOW (ref 13.0–17.0)
MCH: 31.5 pg (ref 26.0–34.0)
MCHC: 33 g/dL (ref 30.0–36.0)
MCV: 95.2 fL (ref 80.0–100.0)
Platelets: 209 10*3/uL (ref 150–400)
RBC: 3.56 MIL/uL — ABNORMAL LOW (ref 4.22–5.81)
RDW: 14.9 % (ref 11.5–15.5)
WBC: 12.4 10*3/uL — ABNORMAL HIGH (ref 4.0–10.5)
nRBC: 0 % (ref 0.0–0.2)

## 2021-06-10 MED ORDER — SPIRONOLACTONE 25 MG PO TABS
25.0000 mg | ORAL_TABLET | Freq: Every day | ORAL | Status: DC
Start: 2021-06-11 — End: 2021-06-16
  Administered 2021-06-11 – 2021-06-15 (×5): 25 mg via ORAL
  Filled 2021-06-10 (×5): qty 1

## 2021-06-10 NOTE — Progress Notes (Signed)
?PROGRESS NOTE ? ? ? ?Billy Cherry  WLS:[id number] DOB: 1970/09/26 DOA: 03/30/2021 ?PCP: Patient, No Pcp Per (Inactive)  ? ? ?Brief Narrative:  ?51 years old male with past medical history of hypertension was brought into the hospital after being found unresponsive by his wife.   ?1/19 admission, left heart cath without significant CAD.  Concern for anoxic brain injury ?1/21: MRI shows areas of hypoxic-ischemic injury bilaterally. Fevered; cultures obtained. ?1/23 severe brain damage ?1/25: PEG . Neuro exam unchanged.  Does exhibit new eyelid tremor like movements and lip/jaw tremor like vs clonus movement.  Bolused Keppra and increased maintenance dose.  Repeat EEG. CTH: concerning for progression of anoxic injury.  ?1/26: Repeat EEG unchanged and continues to show myoclonic seizures despite addition of Valproic acid yesterday. transfer to [name] from [place] for continuous EEG ?1/28: Continues to have myoclonic sz on EEG ?2/3 no meaningful neuro recovery. Diuresed ?2/6 New fever to 102, WBC 16K - treated with vancomycin for 7d based on cultures.  ?2/11: percutaneous tracheostomy ?2/13 >2/20 on trach collar. No vent.  ?2/20 Trach change to 6 cuffless ?2/28: fever ?3/13: eliquis started ?4/3: fever ?06/06/2021 CT of the chest with new PE and haziness consistent with pneumonia ?  ? ? ?Assessment and Plan: ?* Cardiac arrest [facility]) ?Postarrest anoxic encephalopathy ?Postarrest respiratory failure s/p TC ?S/P PEG tube placement ?Postarrest myoclonus: ?2D echocardiogram showed reduced LV function at <20%.  Now status post trach and PEG.   ?Remains obtunded with anoxic encephalopathy-on Keppra, for myoclonus and can increase Klonopin for breakthrough myoclonus.   ?At this time, plan is home with home health ?Patient's wife is trying to arrange adequate care at home ?He will need around-the-clock care.  ?Enteral tube feeding trach kit and supplies to be arranged. PCCM managing tracheostomy. ? ?Anoxic encephalopathy  ([place]) ?Continue supportive care.   ?Patient currently keeps his eyes open, but does not answer questions or follow commands ? ?Acute respiratory failure ([place]) ?Status post trach collar.  Continue supportive care ? ?RLL pneumonia ?Previously had completed vancomycin for MSSA and [name] in early march.  Received more than 7-day course of IV Fortaz. ?On 4/3, CT chest was ordered that indicated an area of alveolar infiltration that may represent pneumonia.  Tracheal aspirate has been sent for culture and is significant for normal respiratory flora. Concerning for aspiration. ?-Continue Unasyn ? ?DVT (deep venous thrombosis) ([place]) ?Duplex ultrasound of lower extremity on 05/15/2021 showed age indeterminate deep vein thrombosis involving the left femoral vein, left proximal profunda vein, left posterior tibial veins, left popliteal vein, and left peroneal veins. Initially on Eliquis but now changed to Lovenox ?-Continue Lovenox ? ?Dysphagia ?Continue PEG tube feeding.  Tolerating but high risk of aspiration ?-Aspiration precautions ? ?Pulmonary embolus ([place]) ?Noted to have possible acute on chronic pulmonary embolism on CT.  Also have some evidence of possible pulmonary hypertension.  Patient was previously managed on Eliquis which is transitioned to Lovenox ?-Continue Lovenox ? ?Pressure injury of skin ?Lateral penis, posterior right heel, right foot. Not present on admission. ? ?HTN (hypertension) ?Blood pressure elevated today. ?-Continue Coreg, amlodipine, Cardura, hydralazine and PRN Labetalol ?-Increase to isosorbide dinitrate 30 mg TID ? ?HFrEF (heart failure with reduced ejection fraction) ([place]) ?2D echocardiogram-LVEF < 20%,  ?-Continue coreg, imdur, spironolactone ? ?Myoclonus ?Seen by neurology.  Continue current AED with Klonopin, Keppra. Per neuro notes, suppressing his myoclonus will not ultimately change the prognosis  ? ?Hypernatremia-resolved as of 06/09/2021 ?Resolved with free water  flushes ? ? ? ?  Hyperkalemia ?-resolved ?-titrate up aldactone ? ?Overall poor prognosis ? ? ? ? ? ?  Code Status: Full Code ?Family Communication: wife 4/6 ? ?Disposition Plan:  ?Level of care: Progressive ?Status is: Inpatient ?Remains inpatient appropriate because: needs safe d/c plan ?  ? ?Consultants:  ?PCCM ?Neurology ?Palliative care ?ID ?IR ?GI ?cards ? ? ?Subjective: ?Eyes open but no meaningful interaction ? ?Objective: ?Vitals:  ? 06/10/21 0448 06/10/21 0451 06/10/21 5035 06/10/21 0804  ?BP: (!) 157/111  (!) 189/131   ?Pulse: 96  (!) 102 96  ?Resp: 20  (!) 28 (!) 26  ?Temp: 99.2 ?F (37.3 ?C)  98.8 ?F (37.1 ?C)   ?TempSrc: Axillary  Axillary   ?SpO2: 99%  98% 99%  ?Weight:  97 kg    ?Height:      ? ? ?Intake/Output Summary (Last 24 hours) at 06/10/2021 0957 ?Last data filed at 06/10/2021 0449 ?Gross per 24 hour  ?Intake --  ?Output 1550 ml  ?Net -1550 ml  ? ?Filed Weights  ? 06/08/21 0400 06/09/21 0334 06/10/21 0451  ?Weight: 92 kg 93 kg 97 kg  ? ? ?Examination: ? ? ?General: Appearance:     ?Ill appearing male -- eyes open  ?   ?Lungs:     respirations unlabored  ?Heart:    Normal heart rate.   ?MS:   All extremities are intact.  ?  ?Neurologic:   Will open eyes but not follow commands, does not blink to threat  ?  ?  ? ? ? ?Data Reviewed: I have personally reviewed following labs and imaging studies ? ?CBC: ?Recent Labs  ?Lab 06/06/21 ?0625 06/07/21 ?0405 06/08/21 ?0532 06/09/21 ?0321 06/10/21 ?0340  ?WBC 10.8* 10.0 10.1 12.5* 12.4*  ?HGB 12.0* 11.3* 12.3* 11.7* 11.2*  ?HCT 36.7* 35.2* 38.7* 35.9* 33.9*  ?MCV 95.6 96.4 95.6 94.7 95.2  ?PLT 230 218 227 235 209  ? ?Basic Metabolic Panel: ?Recent Labs  ?Lab 06/05/21 ?4656 06/06/21 ?8127 06/07/21 ?0405 06/08/21 ?0532 06/09/21 ?0321  ?NA 135 137 136 138 137  ?K 4.5 4.5 5.3* 4.9 4.5  ?CL 97* 99 99 100 99  ?CO2 $Rem'31 30 28 29 31  'Xmqu$ ?GLUCOSE 127* 90 98 106* 105*  ?BUN 32* 37* 40* 35* 35*  ?CREATININE 0.60* 0.71 0.75 0.61 0.67  ?CALCIUM 9.1 8.9 8.7* 8.8* 8.8*   ? ?GFR: ?Estimated Creatinine Clearance: 129.1 mL/min (by C-G formula based on SCr of 0.67 mg/dL). ?Liver Function Tests: ?No results for input(s): AST, ALT, ALKPHOS, BILITOT, PROT, ALBUMIN in the last 168 hours. ? ?No results for input(s): LIPASE, AMYLASE in the last 168 hours. ?No results for input(s): AMMONIA in the last 168 hours. ?Coagulation Profile: ?No results for input(s): INR, PROTIME in the last 168 hours. ?Cardiac Enzymes: ?No results for input(s): CKTOTAL, CKMB, CKMBINDEX, TROPONINI in the last 168 hours. ?BNP (last 3 results) ?No results for input(s): PROBNP in the last 8760 hours. ?HbA1C: ?No results for input(s): HGBA1C in the last 72 hours. ?CBG: ?No results for input(s): GLUCAP in the last 168 hours. ?Lipid Profile: ?No results for input(s): CHOL, HDL, LDLCALC, TRIG, CHOLHDL, LDLDIRECT in the last 72 hours. ?Thyroid Function Tests: ?No results for input(s): TSH, T4TOTAL, FREET4, T3FREE, THYROIDAB in the last 72 hours. ?Anemia Panel: ?No results for input(s): VITAMINB12, FOLATE, FERRITIN, TIBC, IRON, RETICCTPCT in the last 72 hours. ?Sepsis Labs: ?Recent Labs  ?Lab 06/06/21 ?5170 06/06/21 ?0174  ?LATICACIDVEN 1.8 2.2*  ? ? ?Recent Results (from the past 240 hour(s))  ?Culture, blood (routine  x 2)     Status: None (Preliminary result)  ? Collection Time: 06/06/21  6:25 AM  ? Specimen: BLOOD RIGHT HAND  ?Result Value Ref Range Status  ? Specimen Description BLOOD RIGHT HAND  Final  ? Special Requests   Final  ?  BOTTLES DRAWN AEROBIC AND ANAEROBIC Blood Culture adequate volume  ? Culture   Final  ?  NO GROWTH 3 DAYS ?Performed at [facility], [place] [address], [place], [place] [address] ?  ? Report Status PENDING  Incomplete  ?Culture, blood (routine x 2)     Status: None (Preliminary result)  ? Collection Time: 06/06/21  6:38 AM  ? Specimen: BLOOD RIGHT HAND  ?Result Value Ref Range Status  ? Specimen Description BLOOD RIGHT HAND  Final  ? Special Requests   Final  ?  BOTTLES DRAWN AEROBIC AND  ANAEROBIC Blood Culture adequate volume  ? Culture   Final  ?  NO GROWTH 3 DAYS ?Performed at [facility], [place] [address], [place], [place] [address] ?  ? Report Status PENDING  Incomplete  ?Culture, Respiratory w

## 2021-06-11 DIAGNOSIS — Z4659 Encounter for fitting and adjustment of other gastrointestinal appliance and device: Secondary | ICD-10-CM

## 2021-06-11 LAB — CBC
HCT: 32.6 % — ABNORMAL LOW (ref 39.0–52.0)
Hemoglobin: 10.7 g/dL — ABNORMAL LOW (ref 13.0–17.0)
MCH: 31 pg (ref 26.0–34.0)
MCHC: 32.8 g/dL (ref 30.0–36.0)
MCV: 94.5 fL (ref 80.0–100.0)
Platelets: 201 10*3/uL (ref 150–400)
RBC: 3.45 MIL/uL — ABNORMAL LOW (ref 4.22–5.81)
RDW: 14.8 % (ref 11.5–15.5)
WBC: 14 10*3/uL — ABNORMAL HIGH (ref 4.0–10.5)
nRBC: 0 % (ref 0.0–0.2)

## 2021-06-11 LAB — CULTURE, BLOOD (ROUTINE X 2)
Culture: NO GROWTH
Culture: NO GROWTH
Special Requests: ADEQUATE
Special Requests: ADEQUATE

## 2021-06-11 MED ORDER — HYDRALAZINE HCL 50 MG PO TABS
100.0000 mg | ORAL_TABLET | Freq: Four times a day (QID) | ORAL | Status: DC
  Administered 2021-06-11 – 2021-06-12 (×4): 100 mg
  Filled 2021-06-11 (×4): qty 2

## 2021-06-11 MED ORDER — FUROSEMIDE 10 MG/ML IJ SOLN
40.0000 mg | Freq: Every day | INTRAMUSCULAR | Status: DC
  Administered 2021-06-12: 40 mg via INTRAVENOUS
  Filled 2021-06-11: qty 4

## 2021-06-11 MED ORDER — FUROSEMIDE 10 MG/ML IJ SOLN
40.0000 mg | Freq: Once | INTRAMUSCULAR | Status: AC
  Administered 2021-06-11: 40 mg via INTRAVENOUS
  Filled 2021-06-11: qty 4

## 2021-06-11 NOTE — Progress Notes (Addendum)
RN and NT applied CHG wipes on patient while performing pericare after small BM. Changed bed pad and performed peri care prior to wound care on the penis. RN applied xeroform on penis and applied new primofit. RN turned patient towards the left. RN suctioned patient after patient coughed a mucus plug. Red MEWs protocol continued.  ? ?

## 2021-06-11 NOTE — Progress Notes (Signed)
Patient mother called for update, the patient primary nurse was busy suctioning the patient at the time, patient mother was upset said nobody is telling her anything, told her to get update from the patient wife since  she is the POA. ?

## 2021-06-11 NOTE — Progress Notes (Signed)
RN suctioned patient x4 after constant cough spells producing mucus plugs. RN changed split gauze encompassing trach collar. RN gives PO BP meds for a current systolic of 178. RN informed charge RN about elevated systolic BP. ?

## 2021-06-11 NOTE — Progress Notes (Signed)
?PROGRESS NOTE ? ? ? ?Billy Cherry  BEE:[id number] DOB: March 09, 1970 DOA: 03/30/2021 ?PCP: Patient, No Pcp Per (Inactive)  ? ? ?Brief Narrative:  ?51 years old male with past medical history of hypertension was brought into the hospital after being found unresponsive by his wife.   ?1/19 admission, left heart cath without significant CAD.  Concern for anoxic brain injury ?1/21: MRI shows areas of hypoxic-ischemic injury bilaterally. Fevered; cultures obtained. ?1/23 severe brain damage ?1/25: PEG . Neuro exam unchanged.  Does exhibit new eyelid tremor like movements and lip/jaw tremor like vs clonus movement.  Bolused Keppra and increased maintenance dose.  Repeat EEG. CTH: concerning for progression of anoxic injury.  ?1/26: Repeat EEG unchanged and continues to show myoclonic seizures despite addition of Valproic acid yesterday. transfer to [name] from [place] for continuous EEG ?1/28: Continues to have myoclonic sz on EEG ?2/3 no meaningful neuro recovery. Diuresed ?2/6 New fever to 102, WBC 16K - treated with vancomycin for 7d based on cultures.  ?2/11: percutaneous tracheostomy ?2/13 >2/20 on trach collar. No vent.  ?2/20 Trach change to 6 cuffless ?2/28: fever ?3/13: eliquis started ?4/3: fever ?06/06/2021 CT of the chest with new PE and haziness consistent with pneumonia ?4/5-4/9: responding well to PRN lasix ?  ? ? ?Assessment and Plan: ?* Cardiac arrest [facility]) ?Postarrest anoxic encephalopathy ?Postarrest respiratory failure s/p TC ?S/P PEG tube placement ?Postarrest myoclonus: ?2D echocardiogram showed reduced LV function at <20%.  Now status post trach and PEG.   ?Remains obtunded with anoxic encephalopathy-on Keppra, for myoclonus and can increase Klonopin for breakthrough myoclonus.   ?At this time, plan is home with home health ?Patient's wife is trying to arrange adequate care at home ?He will need around-the-clock care.  ?Enteral tube feeding trach kit and supplies to be arranged. PCCM managing  tracheostomy. ? 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   ?Patient currently keeps his eyes open, but does not answer questions or follow commands ? ?Acute respiratory failure ([place]) ?Status post trach collar.  Continue supportive care ? ?RLL pneumonia ?Previously had completed vancomycin for MSSA and [name] in early march.  Received more than 7-day course of IV Fortaz. ?On 4/3, CT chest was ordered that indicated an area of alveolar infiltration that may represent pneumonia.  Tracheal aspirate has been sent for culture and is significant for normal respiratory flora. Concerning for aspiration. ?-Continue Unasyn ?-recheck x ray in AM ? ?DVT (deep venous thrombosis) ([place]) ?Duplex ultrasound of lower extremity on 05/15/2021 showed age indeterminate deep vein thrombosis involving the left femoral vein, left proximal profunda vein, left posterior tibial veins, left popliteal vein, and left peroneal veins. Initially on Eliquis but now changed to Lovenox ?-Continue Lovenox for now-- back to elquis when more stable ? ?Dysphagia ?Continue PEG tube feeding.  Tolerating but high risk of aspiration ?-Aspiration precautions ? ?Pulmonary embolus ([place]) ?Noted to have possible acute on chronic pulmonary embolism on CT.  Also have some evidence of possible pulmonary hypertension.  Patient was previously managed on Eliquis which is transitioned to Lovenox ?-Continue Lovenox ? ?Pressure injury of skin ?Lateral penis, posterior right heel, right foot. Not present on admission. ? ?HTN (hypertension) ?Blood pressure elevated today. ?-Continue Coreg, amlodipine, Cardura, hydralazine (adjust as able) and PRN Labetalol ?-Increase to isosorbide dinitrate 30 mg TID ? ?HFrEF (heart failure with reduced ejection fraction) ([place]) ?2D echocardiogram-LVEF < 20%,  ?-Continue coreg, imdur, spironolactone ? ?Myoclonus ?Seen by neurology.  Continue current AED with Klonopin, Keppra. Per neuro  notes, suppressing his  myoclonus will not ultimately change the prognosis  ? ?Hypernatremia-resolved as of 06/09/2021 ?Resolved with free water flushes ? ? ? ?Hyperkalemia ?-resolved ?-titrate up aldactone ? ?Overall poor prognosis ? ? ? ? ? ?  Code Status: Full Code ?Family Communication: wife 4/6 ? ?Disposition Plan:  ?Level of care: Progressive ?Status is: Inpatient ?Remains inpatient appropriate because: needs safe d/c plan ?  ? ?Consultants:  ?PCCM ?Neurology ?Palliative care ?ID ?IR ?GI ?cards ? ? ?Subjective: ?Eyes open, lots of secretion  ? ?Objective: ?Vitals:  ? 06/11/21 1050 06/11/21 1107 06/11/21 1155 06/11/21 1253  ?BP: (!) 154/113 (!) 164/116 (!) 146/93   ?Pulse: 94 93 99 (!) 105  ?Resp: (!) 23 (!) 23 (!) 23 (!) 24  ?Temp: 99.5 ?F (37.5 ?C) 98.9 ?F (37.2 ?C)    ?TempSrc: Axillary Oral    ?SpO2: 99% 100% 98% 96%  ?Weight:      ?Height:      ? ? ?Intake/Output Summary (Last 24 hours) at 06/11/2021 1334 ?Last data filed at 06/11/2021 1143 ?Gross per 24 hour  ?Intake --  ?Output 1775 ml  ?Net -1775 ml  ? ?Filed Weights  ? 06/09/21 0334 06/10/21 0451 06/11/21 0109  ?Weight: 93 kg 97 kg 98 kg  ? ? ?Examination: ? ? ?General: Appearance:    Obese male in no acute distress  ?   ?Lungs:     diminished, respirations unlabored  ?Heart:    Tachycardic.   ?MS:   All extremities are intact.  ?  ?Neurologic:   Awake, does not interact meaningfully   ?  ?  ? ? ? ?Data Reviewed: I have personally reviewed following labs and imaging studies ? ?CBC: ?Recent Labs  ?Lab 06/07/21 ?0405 06/08/21 ?0532 06/09/21 ?0321 06/10/21 ?4287 06/11/21 ?0503  ?WBC 10.0 10.1 12.5* 12.4* 14.0*  ?HGB 11.3* 12.3* 11.7* 11.2* 10.7*  ?HCT 35.2* 38.7* 35.9* 33.9* 32.6*  ?MCV 96.4 95.6 94.7 95.2 94.5  ?PLT 218 227 235 209 201  ? ?Basic Metabolic Panel: ?Recent Labs  ?Lab 06/05/21 ?6811 06/06/21 ?5726 06/07/21 ?0405 06/08/21 ?0532 06/09/21 ?0321  ?NA 135 137 136 138 137  ?K 4.5 4.5 5.3* 4.9 4.5  ?CL 97* 99 99 100 99  ?CO2 _0 ?GLUCOSE 127* 90 98 106* 105*  ?BUN  32* 37* 40* 35* 35*  ?CREATININE 0.60* 0.71 0.75 0.61 0.67  ?CALCIUM 9.1 8.9 8.7* 8.8* 8.8*  ? ?GFR: ?Estimated Creatinine Clearance: 129.7 mL/min (by C-G formula based on SCr of 0.67 mg/dL). ?Liver Function Tests: ?No results for input(s): AST, ALT, ALKPHOS, BILITOT, PROT, ALBUMIN in the last 168 hours. ? ?No results for input(s): LIPASE, AMYLASE in the last 168 hours. ?No results for input(s): AMMONIA in the last 168 hours. ?Coagulation Profile: ?No results for input(s): INR, PROTIME in the last 168 hours. ?Cardiac Enzymes: ?No results for input(s): CKTOTAL, CKMB, CKMBINDEX, TROPONINI in the last 168 hours. ?BNP (last 3 results) ?No results for input(s): PROBNP in the last 8760 hours. ?HbA1C: ?No results for input(s): HGBA1C in the last 72 hours. ?CBG: ?No results for input(s): GLUCAP in the last 168 hours. ?Lipid Profile: ?No results for input(s): CHOL, HDL, LDLCALC, TRIG, CHOLHDL, LDLDIRECT in the last 72 hours. ?Thyroid Function Tests: ?No results for input(s): TSH, T4TOTAL, FREET4, T3FREE, THYROIDAB in the last 72 hours. ?Anemia Panel: ?No results for input(s): VITAMINB12, FOLATE, FERRITIN, TIBC, IRON, RETICCTPCT in the last 72 hours. ?Sepsis Labs: ?Recent Labs  ?Lab 06/06/21 ?0625 06/06/21 ?  0854  ?LATICACIDVEN 1.8 2.2*  ? ? ?Recent Results (from the past 240 hour(s))  ?Culture, blood (routine x 2)     Status: None (Preliminary result)  ? Collection Time: 06/06/21  6:25 AM  ? Specimen: BLOOD RIGHT HAND  ?Result Value Ref Range Status  ? Specimen Description BLOOD RIGHT HAND  Final  ? Special Requests   Final  ?  BOTTLES DRAWN AEROBIC AND ANAEROBIC Blood Culture adequate volume  ? Culture   Final  ?  NO GROWTH 4 DAYS ?Performed at [facility], [place] [address], [place], [place] [address] ?  ? Report Status PENDING  Incomplete  ?Culture, blood (routine x 2)     Status: None (Preliminary result)  ? Collection Time: 06/06/21  6:38 AM  ? Specimen: BLOOD RIGHT HAND  ?Result Value Ref Range Status  ? Specimen  Description BLOOD RIGHT HAND  Final  ? Special Requests   Final  ?  BOTTLES DRAWN AEROBIC AND ANAEROBIC Blood Culture adequate volume  ? Culture   Final  ?  NO GROWTH 4 DAYS ?Performed at [place]

## 2021-06-12 ENCOUNTER — Inpatient Hospital Stay (HOSPITAL_COMMUNITY): Payer: BC Managed Care – PPO

## 2021-06-12 LAB — CBC
HCT: 33.7 % — ABNORMAL LOW (ref 39.0–52.0)
Hemoglobin: 11 g/dL — ABNORMAL LOW (ref 13.0–17.0)
MCH: 30.9 pg (ref 26.0–34.0)
MCHC: 32.6 g/dL (ref 30.0–36.0)
MCV: 94.7 fL (ref 80.0–100.0)
Platelets: 215 10*3/uL (ref 150–400)
RBC: 3.56 MIL/uL — ABNORMAL LOW (ref 4.22–5.81)
RDW: 14.6 % (ref 11.5–15.5)
WBC: 14.6 10*3/uL — ABNORMAL HIGH (ref 4.0–10.5)
nRBC: 0 % (ref 0.0–0.2)

## 2021-06-12 LAB — BASIC METABOLIC PANEL
Anion gap: 8 (ref 5–15)
BUN: 25 mg/dL — ABNORMAL HIGH (ref 6–20)
CO2: 31 mmol/L (ref 22–32)
Calcium: 8.7 mg/dL — ABNORMAL LOW (ref 8.9–10.3)
Chloride: 95 mmol/L — ABNORMAL LOW (ref 98–111)
Creatinine, Ser: 0.54 mg/dL — ABNORMAL LOW (ref 0.61–1.24)
GFR, Estimated: >60 mL/min (ref >60)
Glucose, Bld: 102 mg/dL — ABNORMAL HIGH (ref 70–99)
Potassium: 4.3 mmol/L (ref 3.5–5.1)
Sodium: 134 mmol/L — ABNORMAL LOW (ref 135–145)

## 2021-06-12 MED ORDER — HYDRALAZINE HCL 50 MG PO TABS
100.0000 mg | ORAL_TABLET | Freq: Three times a day (TID) | ORAL | Status: DC
  Administered 2021-06-12 – 2021-06-13 (×2): 100 mg
  Filled 2021-06-12 (×4): qty 2

## 2021-06-12 MED ORDER — AMLODIPINE BESYLATE 2.5 MG PO TABS
2.5000 mg | ORAL_TABLET | Freq: Every day | ORAL | Status: DC
  Administered 2021-06-13 – 2021-06-15 (×3): 2.5 mg via ORAL
  Filled 2021-06-12 (×3): qty 1

## 2021-06-12 MED ORDER — AMLODIPINE BESYLATE 5 MG PO TABS
5.0000 mg | ORAL_TABLET | Freq: Every day | ORAL | Status: DC
  Administered 2021-06-12: 5 mg via ORAL
  Filled 2021-06-12: qty 1

## 2021-06-12 NOTE — TOC Progression Note (Signed)
Transition of Care (TOC) - Progression Note  ? ? ?Patient Details  ?Name: Billy Cherry ?MRN: [id number] ?Date of Birth: Jul 05, 1970 ? ?Transition of Care (TOC) CM/SW Contact  ?Leone Haven, RN ?Phone Number: ?06/12/2021, 1:47 PM ? ?Clinical Narrative:    ?Still waiting to get authorization for the skilled nursing.  Also will need to let [employer] know when to deliver the hospital bed, it will have to be delivered within 48hrs of patient being discharged and not before.  [employer] is also supplying the trach supplies with  ambu bag and smaller trach and bolus tube feeding supplies.   Will keep [employer] up todate about auth as well. ? ? ?Expected Discharge Plan: Home w Home Health Services ?Barriers to Discharge: Barriers Resolved ? ?Expected Discharge Plan and Services ?Expected Discharge Plan: Home w Home Health Services ?In-house Referral: Hospice / Palliative Care ?Discharge Planning Services: CM Consult ?Post Acute Care Choice: Long Term Acute Care (LTAC) ?Living arrangements for the past 2 months: Single Family Home ?                ?DME Arranged: N/A ?DME Agency: [employer] ?  ?  ?  ?HH Arranged: Nurse's Aide ?HH Agency: [facility] ?  ?  ?Representative spoke with at [facility] Agency: [name] ? ? ?Social Determinants of Health (SDOH) Interventions ?  ? ?Readmission Risk Interventions ? ?  04/14/2021  ? 11:14 AM  ?Readmission Risk Prevention Plan  ?Transportation Screening Complete  ?HRI or Home Care Consult Complete  ?Social Work Consult for Recovery Care Planning/Counseling Complete  ?Palliative Care Screening Complete  ?Medication Review [facility]) Complete  ? ? ?

## 2021-06-12 NOTE — Progress Notes (Signed)
?PROGRESS NOTE ? ? ? ?Billy Cherry  TYO:[id number] DOB: October 24, 1970 DOA: 03/30/2021 ?PCP: Patient, No Pcp Per (Inactive)  ? ? ?Brief Narrative:  ?51 years old male with past medical history of hypertension was brought into the hospital after being found unresponsive by his wife.   ?1/19 admission, left heart cath without significant CAD.  Concern for anoxic brain injury ?1/21: MRI shows areas of hypoxic-ischemic injury bilaterally. Fevered; cultures obtained. ?1/23 severe brain damage ?1/25: PEG . Neuro exam unchanged.  Does exhibit new eyelid tremor like movements and lip/jaw tremor like vs clonus movement.  Bolused Keppra and increased maintenance dose.  Repeat EEG. CTH: concerning for progression of anoxic injury.  ?1/26: Repeat EEG unchanged and continues to show myoclonic seizures despite addition of Valproic acid yesterday. transfer [name] from [place] for continuous EEG ?1/28: Continues to have myoclonic sz on EEG ?2/3 no meaningful neuro recovery. Diuresed ?2/6 New fever to 102, WBC 16K - treated with vancomycin for 7d based on cultures.  ?2/11: percutaneous tracheostomy ?2/13 >2/20 on trach collar. No vent.  ?2/20 Trach change to 6 cuffless ?2/28: fever ?3/13: eliquis started ?4/3: fever ?06/06/2021 CT of the chest with new PE and haziness consistent with pneumonia ?4/5-4/9: responding well to PRN lasix ?  ? ? ?Assessment and Plan: ?* Cardiac arrest [facility]) ?Postarrest anoxic encephalopathy ?Postarrest respiratory failure s/p TC ?S/P PEG tube placement ?Postarrest myoclonus: ?2D echocardiogram showed reduced LV function at <20%.  Now status post trach and PEG.   ?Remains obtunded with anoxic encephalopathy-on Keppra, for myoclonus and can increase Klonopin for breakthrough myoclonus.   ?At this time, plan is home with home health ?Patient's wife is trying to arrange adequate care at home ?He will need around-the-clock care.  ?Enteral tube feeding trach kit and supplies to be arranged. PCCM managing  tracheostomy. ? 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   ?Patient currently keeps his eyes open, but does not answer questions or follow commands ? ?Acute respiratory failure ([place]) ?Status post trach collar.  Continue supportive care ? ?RLL pneumonia ?Previously had completed vancomycin for MSSA and [name] in early march.  Received more than 7-day course of IV Fortaz. ?On 4/3, CT chest was ordered that indicated an area of alveolar infiltration that may represent pneumonia.  Tracheal aspirate has been sent for culture and is significant for normal respiratory flora. Concerning for aspiration. ?-Continue Unasyn x 7 days ? ?DVT (deep venous thrombosis) ([place]) ?Duplex ultrasound of lower extremity on 05/15/2021 showed age indeterminate deep vein thrombosis involving the left femoral vein, left proximal profunda vein, left posterior tibial veins, left popliteal vein, and left peroneal veins. Initially on Eliquis but now changed to Lovenox ?-Continue Lovenox for now-- back to elquis when more stable (do not think this is an elquis failure) ? ?Dysphagia ?Continue PEG tube feeding.  Tolerating but high risk of aspiration ?-Aspiration precautions ? ?Pulmonary embolus ([place]) ?Noted to have possible acute on chronic pulmonary embolism on CT.  Also have some evidence of possible pulmonary hypertension.  Patient was previously managed on Eliquis which is transitioned to Lovenox ?-Continue Lovenox ? ?Pressure injury of skin ?Lateral penis, posterior right heel, right foot. Not present on admission. ? ?HTN (hypertension) ?Blood pressure elevated today. ?-Continue Coreg, amlodipine (restarted 4/10), Cardura, hydralazine and PRN Labetalol ?-Increased to isosorbide dinitrate 30 mg TID ? ?HFrEF (heart failure with reduced ejection fraction) ([place]) ?2D echocardiogram-LVEF < 20%,  ?-Continue coreg, imdur, spironolactone ? ?Myoclonus ?Seen by neurology.  Continue current AED  with Klonopin, Keppra. Per  neuro notes, suppressing his myoclonus will not ultimately change the prognosis  ? ?Hypernatremia-resolved as of 06/09/2021 ?Resolved with free water flushes ? ? ? ?Hyperkalemia ?-resolved ?-titrate up aldactone ? ?Overall poor prognosis ? ? ? ? ? ?  Code Status: Full Code ?Family Communication: wife 4/6 ? ?Disposition Plan:  ?Level of care: Progressive ?Status is: Inpatient ?Remains inpatient appropriate because: needs safe d/c plan ?  ? ?Consultants:  ?PCCM ?Neurology ?Palliative care ?ID ?IR ?GI ?cards ? ? ?Subjective: ?Eyes open, lots of secretion  ? ?Objective: ?Vitals:  ? 06/12/21 0830 06/12/21 1054 06/12/21 1202 06/12/21 1224  ?BP:  114/85    ?Pulse: 97 (!) 107  (!) 107  ?Resp: (!) 25 20 (!) 31 19  ?Temp:  99 ?F (37.2 ?C)    ?TempSrc:  Oral    ?SpO2: 98% 93%  90%  ?Weight:      ?Height:      ? ? ?Intake/Output Summary (Last 24 hours) at 06/12/2021 1315 ?Last data filed at 06/12/2021 1056 ?Gross per 24 hour  ?Intake 655 ml  ?Output 2251 ml  ?Net -1596 ml  ? ?Filed Weights  ? 06/10/21 0451 06/11/21 0109 06/12/21 0016  ?Weight: 97 kg 98 kg 96 kg  ? ? ?Examination: ? ? ?General: Appearance:    Obese male with excessive secretions  ?   ?Lungs:     Trach in place, respirations unlabored  ?Heart:    Tachycardic.   ?MS:   All extremities are intact.  ?  ?Neurologic:   Awake, eyes open, does not follow commands  ?  ? ? ?Data Reviewed: I have personally reviewed following labs and imaging studies ? ?CBC: ?Recent Labs  ?Lab 06/08/21 ?0532 06/09/21 ?0321 06/10/21 ?7902 06/11/21 ?4097 06/12/21 ?0232  ?WBC 10.1 12.5* 12.4* 14.0* 14.6*  ?HGB 12.3* 11.7* 11.2* 10.7* 11.0*  ?HCT 38.7* 35.9* 33.9* 32.6* 33.7*  ?MCV 95.6 94.7 95.2 94.5 94.7  ?PLT 227 235 209 201 215  ? ?Basic Metabolic Panel: ?Recent Labs  ?Lab 06/06/21 ?0625 06/07/21 ?0405 06/08/21 ?0532 06/09/21 ?0321 06/12/21 ?3532  ?NA 137 136 138 137 134*  ?K 4.5 5.3* 4.9 4.5 4.3  ?CL 99 99 100 99 95*  ?CO2 $Rem'30 28 29 31 31  'dUnt$ ?GLUCOSE 90 98 106* 105* 102*  ?BUN 37* 40* 35* 35*  25*  ?CREATININE 0.71 0.75 0.61 0.67 0.54*  ?CALCIUM 8.9 8.7* 8.8* 8.8* 8.7*  ? ?GFR: ?Estimated Creatinine Clearance: 128.4 mL/min (A) (by C-G formula based on SCr of 0.54 mg/dL (L)). ?Liver Function Tests: ?No results for input(s): AST, ALT, ALKPHOS, BILITOT, PROT, ALBUMIN in the last 168 hours. ? ?No results for input(s): LIPASE, AMYLASE in the last 168 hours. ?No results for input(s): AMMONIA in the last 168 hours. ?Coagulation Profile: ?No results for input(s): INR, PROTIME in the last 168 hours. ?Cardiac Enzymes: ?No results for input(s): CKTOTAL, CKMB, CKMBINDEX, TROPONINI in the last 168 hours. ?BNP (last 3 results) ?No results for input(s): PROBNP in the last 8760 hours. ?HbA1C: ?No results for input(s): HGBA1C in the last 72 hours. ?CBG: ?No results for input(s): GLUCAP in the last 168 hours. ?Lipid Profile: ?No results for input(s): CHOL, HDL, LDLCALC, TRIG, CHOLHDL, LDLDIRECT in the last 72 hours. ?Thyroid Function Tests: ?No results for input(s): TSH, T4TOTAL, FREET4, T3FREE, THYROIDAB in the last 72 hours. ?Anemia Panel: ?No results for input(s): VITAMINB12, FOLATE, FERRITIN, TIBC, IRON, RETICCTPCT in the last 72 hours. ?Sepsis Labs: ?Recent Labs  ?Lab 06/06/21 ?0625 06/06/21 ?  0854  ?LATICACIDVEN 1.8 2.2*  ? ? ?Recent Results (from the past 240 hour(s))  ?Culture, blood (routine x 2)     Status: None  ? Collection Time: 06/06/21  6:25 AM  ? Specimen: BLOOD RIGHT HAND  ?Result Value Ref Range Status  ? Specimen Description BLOOD RIGHT HAND  Final  ? Special Requests   Final  ?  BOTTLES DRAWN AEROBIC AND ANAEROBIC Blood Culture adequate volume  ? Culture   Final  ?  NO GROWTH 5 DAYS ?Performed at [facility], [place] [address], [place], [place] [address] ?  ? Report Status 06/11/2021 FINAL  Final  ?Culture, blood (routine x 2)     Status: None  ? Collection Time: 06/06/21  6:38 AM  ? Specimen: BLOOD RIGHT HAND  ?Result Value Ref Range Status  ? Specimen Description BLOOD RIGHT HAND  Final  ? Special  Requests   Final  ?  BOTTLES DRAWN AEROBIC AND ANAEROBIC Blood Culture adequate volume  ? Culture   Final  ?  NO GROWTH 5 DAYS ?Performed at [facility], [place] [address] [place], [place] [address] ?  ?

## 2021-06-12 NOTE — Progress Notes (Signed)
ANTICOAGULATION CONSULT NOTE ?Pharmacy Consult for Lovenox ?Indication: pulmonary embolus ? ?Allergies  ?Allergen Reactions  ? Lisinopril Swelling  ?  angioedema  ? ? ?Patient Measurements: ?Height:  (bed scale not working) ?Weight: 96 kg (211 lb 10.3 oz) ?IBW/kg (Calculated) : 73 ?Heparin Dosing Weight: 93 ? ?Vital Signs: ?Temp: 99.2 ?F (37.3 ?C) (04/10 2620) ?Temp Source: Oral (04/10 3559) ?BP: 135/99 (04/10 0713) ?Pulse Rate: 93 (04/10 0713) ? ?Labs: ?Recent Labs  ?  06/10/21 ?7416 06/11/21 ?3845 06/12/21 ?0232  ?HGB 11.2* 10.7* 11.0*  ?HCT 33.9* 32.6* 33.7*  ?PLT 209 201 215  ?CREATININE  --   --  0.54*  ? ? ? ?Estimated Creatinine Clearance: 128.4 mL/min (A) (by C-G formula based on SCr of 0.54 mg/dL (L)). ? ? ?Medical History: ?Past Medical History:  ?Diagnosis Date  ? Hypertension   ? ? ?Assessment: 50 yoM status post tracheostomy and PEG tube placement found to have new PE. 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 Patient has been on full treatment dose Eliquis since 3/13 after he was found to have LE DVTs. No missed or held doses. At this time is considered a treatment failure.  ?Pharmacy consulted to start Lovenox on 4/4. ?CBC stable, no bleeding noted, SCr stable, wt 96 kg (fluctuating between 91-99 kg the last few weeks) ? ?Goal of Therapy:  ?Anti-Xa 0.6-1 ?Monitor platelets by anticoagulation protocol: Yes ?  ?Plan:  ?Continue Lovenox 100mg  SQ BID ?Monitor renal function, CBC and for signs and symptoms of bleeding ?F/u with PO plan ? ? ? ?Thank you for allowing to participate in this patients care. ?[place], PharmD ?06/12/2021 8:52 AM ? ?**Pharmacist phone directory can be found on [email or web address] listed under [facility]** ? ? ? ? ? ?

## 2021-06-13 ENCOUNTER — Inpatient Hospital Stay (HOSPITAL_COMMUNITY): Payer: BC Managed Care – PPO

## 2021-06-13 DIAGNOSIS — R509 Fever, unspecified: Secondary | ICD-10-CM | POA: Diagnosis not present

## 2021-06-13 LAB — BASIC METABOLIC PANEL
Anion gap: 8 (ref 5–15)
BUN: 34 mg/dL — ABNORMAL HIGH (ref 6–20)
CO2: 31 mmol/L (ref 22–32)
Calcium: 8.4 mg/dL — ABNORMAL LOW (ref 8.9–10.3)
Chloride: 97 mmol/L — ABNORMAL LOW (ref 98–111)
Creatinine, Ser: 0.63 mg/dL (ref 0.61–1.24)
GFR, Estimated: >60 mL/min (ref >60)
Glucose, Bld: 111 mg/dL — ABNORMAL HIGH (ref 70–99)
Potassium: 4.2 mmol/L (ref 3.5–5.1)
Sodium: 136 mmol/L (ref 135–145)

## 2021-06-13 LAB — URINALYSIS, ROUTINE W REFLEX MICROSCOPIC
Bilirubin Urine: NEGATIVE
Glucose, UA: NEGATIVE mg/dL
Hgb urine dipstick: NEGATIVE
Ketones, ur: NEGATIVE mg/dL
Leukocytes,Ua: NEGATIVE
Nitrite: NEGATIVE
Protein, ur: NEGATIVE mg/dL
Specific Gravity, Urine: 1.016 (ref 1.005–1.030)
pH: 6 (ref 5.0–8.0)

## 2021-06-13 LAB — CBC
HCT: 30.4 % — ABNORMAL LOW (ref 39.0–52.0)
Hemoglobin: 9.9 g/dL — ABNORMAL LOW (ref 13.0–17.0)
MCH: 31.1 pg (ref 26.0–34.0)
MCHC: 32.6 g/dL (ref 30.0–36.0)
MCV: 95.6 fL (ref 80.0–100.0)
Platelets: 247 10*3/uL (ref 150–400)
RBC: 3.18 MIL/uL — ABNORMAL LOW (ref 4.22–5.81)
RDW: 14.9 % (ref 11.5–15.5)
WBC: 12.9 10*3/uL — ABNORMAL HIGH (ref 4.0–10.5)
nRBC: 0 % (ref 0.0–0.2)

## 2021-06-13 MED ORDER — HYDRALAZINE HCL 50 MG PO TABS
50.0000 mg | ORAL_TABLET | Freq: Three times a day (TID) | ORAL | Status: DC
  Administered 2021-06-13 – 2021-06-27 (×38): 50 mg
  Filled 2021-06-13 (×40): qty 1

## 2021-06-13 NOTE — Progress Notes (Signed)
Nutrition Follow-up ? ?DOCUMENTATION CODES:  ? ?Not applicable ? ?INTERVENTION:  ? ?Tube Feeding via PEG:  ?1.5 cartons of Osmolite 1.5 - QID (335 mL each bolus) ?45 mL ProSource TF - BID ?Free water 200 mL free water q4h  ?Provides 2210 kcal, 111 gm PRO, and 1086 mL free water (2286 mL total free water) daily.  ? ?Continue Juven BID per tube, each packet provides 80 calories, 8 grams of carbohydrate, 2.5  grams of protein (collagen), 7 grams of L-arginine and 7 grams of L-glutamine; supplement contains CaHMB, Vitamins C, E, B12 and Zinc to promote wound healing ? ?NUTRITION DIAGNOSIS:  ? ?Inadequate oral intake related to inability to eat as evidenced by NPO status. - Being addressed via TF  ? ?GOAL:  ? ?Patient will meet greater than or equal to 90% of their needs - Met via TF ? ?MONITOR:  ? ?Labs, Weight trends, Skin, TF tolerance ? ?REASON FOR ASSESSMENT:  ? ?Ventilator, Consult ?Enteral/tube feeding initiation and management ? ?ASSESSMENT:  ? ?51 year old male who originally presented to [facility] on 1/19 after out-of-hospital cardiac arrest due to suspected angioedema from ACE inhibitor leading to asphyxiation. PMH of HTN, HLD. ? ?2/11 - tracheostomy placed ?2/22 - PEG placed  ? ?Plan is Home with [place]. Still pending authorization for skilled nursing.  ? ?Per WOC note, penis wound is healing. Still has DTI on R heel and stage 2 R plantar foot.  ? ?Pt tolerating 355 mL Osmolite 1.5 - 4 times per day, ProSource TF - BID, and Juven - BID. ? ?Medications reviewed and include: Fiber BID, Reglan, Protonix, Spironolactone  ?Labs reviewed: BUN 34 ? ?Diet Order:   ?Diet Order   ? ? None  ? ?  ? ?EDUCATION NEEDS:  ? ?Not appropriate for education at this time ? ?Skin:  Skin Assessment: Skin Integrity Issues: ?Skin Integrity Issues:: Stage III, DTI ?DTI: R Foot & Heel ?Stage III: Penis ?Other: MASD Buttocks ? ?Last BM:  4/9 - Type 4 ? ?Height:  ? ?Ht Readings from Last 1 Encounters:  ?04/18/21 _0  (1.778 m)   ? ? ?Weight:  ? ?Wt Readings from Last 1 Encounters:  ?06/13/21 94 kg  ? ? ?Ideal Body Weight:  75.5 kg ? ?BMI:  Body mass index is 29.73 kg/m?. ? ?Estimated Nutritional Needs:  ? ?Kcal:  2200-2400 ? ?Protein:  110-125 grams ? ?Fluid:  >/= 2.2 L ? ? ?Hermina Barters RD, LDN ?Clinical Dietitian ?See AMiON for contact information.  ? ? ?

## 2021-06-13 NOTE — Consult Note (Signed)
[place] Nurse wound follow up ?Refer to previous [facility] consult notes for previous presure injury measurements and descriptions.  Penis is healed without any open wounds. ?Wound type: Right posterior heel with dark purple fluid filled intact blister; 6X5cm, Deep tissue pressure injury. ?Right plantar foot with dry eschar which removes easily, revealing 1X2X.2cm pink moist Stage 2 pressure injury. ?Dressing procedure/placement/frequency: Topical treatment orders provided for bedside nurses to perform as follows: Wound care to Right heel and right foot: Cover with silicone foam Place foot into Prevalon boots. Change foam dressings Q 3 days or PRN soiling. ?[place] team will reassess the locations weekly to determine if a change in the plan of care is indicated at that time.  ?Julien Girt MSN, RN, [place], [place], CNS ?[phone]  ?

## 2021-06-13 NOTE — Progress Notes (Signed)
Left lower extremity venous duplex has been completed. ?Preliminary results can be found in CV Proc through chart review.  ? ?06/13/21 10:51 AM ?Olen Cordial RVT   ?

## 2021-06-13 NOTE — Assessment & Plan Note (Addendum)
Last fever 6/13 at 5 am (temp to 99.9 6/15) Blood cultures [id number] resp culture pending collection Completed 7 days abx

## 2021-06-13 NOTE — Progress Notes (Signed)
?PROGRESS NOTE ? ? ? ?Billy Cherry  QAS:[id number] DOB: 1970/09/01 DOA: 03/30/2021 ?PCP: Patient, No Pcp Per (Inactive)  ? ? ?Brief Narrative:  ?51 years old male with past medical history of hypertension was brought into the hospital after being found unresponsive by his wife.   ?1/19 admission, left heart cath without significant CAD.  Concern for anoxic brain injury ?1/21: MRI shows areas of hypoxic-ischemic injury bilaterally. Fevered; cultures obtained. ?1/23 severe brain damage ?1/25: PEG . Neuro exam unchanged.  Does exhibit new eyelid tremor like movements and lip/jaw tremor like vs clonus movement.  Bolused Keppra and increased maintenance dose.  Repeat EEG. CTH: concerning for progression of anoxic injury.  ?1/26: Repeat EEG unchanged and continues to show myoclonic seizures despite addition of Valproic acid yesterday. transfer to [name] from [place] for continuous EEG ?1/28: Continues to have myoclonic sz on EEG ?2/3 no meaningful neuro recovery. Diuresed ?2/6 New fever to 102, WBC 16K - treated with vancomycin for 7d based on cultures.  ?2/11: percutaneous tracheostomy ?2/13 >2/20 on trach collar. No vent.  ?2/20 Trach change to 6 cuffless ?2/28: fever ?3/13: eliquis started ?4/3: fever ?06/06/2021 CT of the chest with new PE and haziness consistent with pneumonia ?4/5-4/9: responding well to PRN lasix ?4/11 fever again after completion of abx- work up in progress ?  ? ? ?Assessment and Plan: ?* Cardiac arrest [facility]) ?Postarrest anoxic encephalopathy ?Postarrest respiratory failure s/p TC ?S/P PEG tube placement ?Postarrest myoclonus: ?2D echocardiogram showed reduced LV function at <20%.  Now status post trach and PEG.   ?Remains obtunded with anoxic encephalopathy-on Keppra, for myoclonus and can increase Klonopin for breakthrough myoclonus.   ?At this time, plan is home with home health ?Patient's wife is trying to arrange adequate care at home ?He will need around-the-clock care.  ?Enteral tube  feeding trach kit and supplies to be arranged. PCCM managing tracheostomy. ? ?Anoxic encephalopathy ([place]) ?Continue supportive care.   ?Patient currently keeps his eyes open, but does not answer questions or follow commands ? ?Acute respiratory failure ([place]) ?Status post trach collar.  Continue supportive care ? ?RLL pneumonia ?Previously had completed vancomycin for MSSA and [name] in early march.  Received more than 7-day course of IV Fortaz. ?On 4/3, CT chest was ordered that indicated an area of alveolar infiltration that may represent pneumonia.  Tracheal aspirate has been sent for culture and is significant for normal respiratory flora. Concerning for aspiration. ?-s/p Unasyn x 7 days ?-fever again after unasyn  ? ?DVT (deep venous thrombosis) ([place]) ?Duplex ultrasound of lower extremity on 05/15/2021 showed age indeterminate deep vein thrombosis involving the left femoral vein, left proximal profunda vein, left posterior tibial veins, left popliteal vein, and left peroneal veins. Initially on Eliquis but now changed to Lovenox ?-Continue Lovenox for now-- back to elquis when more stable (do not think this is an elquis failure) ?-repeated duplex (due to fever) and clot has not propagated.   ? ?Dysphagia ?Continue PEG tube feeding.  Tolerating but high risk of aspiration ?-Aspiration precautions ? ?Fever ?-appears to be a recurrent issues when not on abx ?-has been treated multiple times with 7 day courses ?-BC x2, U/A ?-duplex negative for worsening PE ?-? Need for ID consult ?-x ray w/o worsening PNA ? ?Pulmonary embolus ([place]) ?Noted to have possible acute on chronic pulmonary embolism on CT.  Also have some evidence of possible pulmonary hypertension.  Patient was previously managed on Eliquis which is transitioned to Lovenox ?-Continue Lovenox  for now ? ?Pressure injury of skin ?Lateral penis, posterior right heel, right foot. Not present on admission. ? ?HTN (hypertension) ?Blood pressure  elevated today. ?-Continue Coreg, amlodipine (restarted 4/10), Cardura, hydralazine and PRN Labetalol,  isosorbide dinitrate 30 mg TID ? ?HFrEF (heart failure with reduced ejection fraction) ([place]) ?2D echocardiogram-LVEF < 20%,  ?-Continue coreg, imdur, spironolactone ?-PRN lasix ? ?Myoclonus ?Seen by neurology.  Continue current AED with Klonopin, Keppra. Per neuro notes, suppressing his myoclonus will not ultimately change the prognosis  ? ?Hypernatremia-resolved as of 06/09/2021 ?Resolved with free water flushes ? ? ? ?Hyperkalemia ?-resolved ?-titrate up aldactone to prior dose ? ?Overall poor prognosis ? ? ? ? ? ?  Code Status: Full Code ?Family Communication: wife 4/6 ? ?Disposition Plan:  ?Level of care: Progressive ?Status is: Inpatient ?Remains inpatient appropriate because: needs safe d/c plan ?  ? ?Consultants:  ?PCCM ?Neurology ?Palliative care ?ID ?IR ?GI ?cards ? ? ?Subjective: ?Eyes open, fever this AM ? ?Objective: ?Vitals:  ? 06/13/21 1000 06/13/21 1135 06/13/21 1141 06/13/21 1423  ?BP: 116/85  110/70 113/80  ?Pulse: (!) 108 (!) 107 (!) 106 95  ?Resp: 20 (!) 22 (!) 28 (!) 25  ?Temp: 100.1 ?F (37.8 ?C)  98.6 ?F (37 ?C)   ?TempSrc: Axillary  Oral   ?SpO2: 100% 95% 100% 98%  ?Weight:      ?Height:      ? ? ?Intake/Output Summary (Last 24 hours) at 06/13/2021 1428 ?Last data filed at 06/13/2021 2575 ?Gross per 24 hour  ?Intake 555 ml  ?Output 1350 ml  ?Net -795 ml  ? ?Filed Weights  ? 06/11/21 0109 06/12/21 0016 06/13/21 0000  ?Weight: 98 kg 96 kg 94 kg  ? ? ?Examination: ? ? ? ?General: Appearance:     ?Overweight male in no acute distress, trach in place, less secretions   ?   ?Lungs:     No wheezing respirations unlabored  ?Heart:    Normal heart rate.  ?  ?MS:   All extremities are intact.  ?  ?Neurologic:   Eyes open, not following commands  ?  ?  ?  ? ? ?Data Reviewed: I have personally reviewed following labs and imaging studies ? ?CBC: ?Recent Labs  ?Lab 06/09/21 ?0321 06/10/21 ?0518 06/11/21 ?0503  06/12/21 ?3358 06/13/21 ?0327  ?WBC 12.5* 12.4* 14.0* 14.6* 12.9*  ?HGB 11.7* 11.2* 10.7* 11.0* 9.9*  ?HCT 35.9* 33.9* 32.6* 33.7* 30.4*  ?MCV 94.7 95.2 94.5 94.7 95.6  ?PLT 235 209 201 215 247  ? ?Basic Metabolic Panel: ?Recent Labs  ?Lab 06/07/21 ?0405 06/08/21 ?0532 06/09/21 ?0321 06/12/21 ?2518 06/13/21 ?0327  ?NA 136 138 137 134* 136  ?K 5.3* 4.9 4.5 4.3 4.2  ?CL 99 100 99 95* 97*  ?CO2 _0 ?GLUCOSE 98 106* 105* 102* 111*  ?BUN 40* 35* 35* 25* 34*  ?CREATININE 0.75 0.61 0.67 0.54* 0.63  ?CALCIUM 8.7* 8.8* 8.8* 8.7* 8.4*  ? ?GFR: ?Estimated Creatinine Clearance: 127.2 mL/min (by C-G formula based on SCr of 0.63 mg/dL). ?Liver Function Tests: ?No results for input(s): AST, ALT, ALKPHOS, BILITOT, PROT, ALBUMIN in the last 168 hours. ? ?No results for input(s): LIPASE, AMYLASE in the last 168 hours. ?No results for input(s): AMMONIA in the last 168 hours. ?Coagulation Profile: ?No results for input(s): INR, PROTIME in the last 168 hours. ?Cardiac Enzymes: ?No results for input(s): CKTOTAL, CKMB, CKMBINDEX, TROPONINI in the last 168 hours. ?BNP (last 3 results) ?No results for input(s):  PROBNP in the last 8760 hours. ?HbA1C: ?No results for input(s): HGBA1C in the last 72 hours. ?CBG: ?No results for input(s): GLUCAP in the last 168 hours. ?Lipid Profile: ?No results for input(s): CHOL, HDL, LDLCALC, TRIG, CHOLHDL, LDLDIRECT in the last 72 hours. ?Thyroid Function Tests: ?No results for input(s): TSH, T4TOTAL, FREET4, T3FREE, THYROIDAB in the last 72 hours. ?Anemia Panel: ?No results for input(s): VITAMINB12, FOLATE, FERRITIN, TIBC, IRON, RETICCTPCT in the last 72 hours. ?Sepsis Labs: ?No results for input(s): PROCALCITON, LATICACIDVEN in the last 168 hours. ? ? ?Recent Results (from the past 240 hour(s))  ?Culture, blood (routine x 2)     Status: None  ? Collection Time: 06/06/21  6:25 AM  ? Specimen: BLOOD RIGHT HAND  ?Result Value Ref Range Status  ? Specimen Description BLOOD RIGHT HAND  Final  ?  Special Requests   Final  ?  BOTTLES DRAWN AEROBIC AND ANAEROBIC Blood Culture adequate volume  ? Culture   Final  ?  NO GROWTH 5 DAYS ?Performed at [facility], [place] [address], [place], [place]

## 2021-06-14 LAB — BASIC METABOLIC PANEL
Anion gap: 6 (ref 5–15)
BUN: 33 mg/dL — ABNORMAL HIGH (ref 6–20)
CO2: 33 mmol/L — ABNORMAL HIGH (ref 22–32)
Calcium: 8.5 mg/dL — ABNORMAL LOW (ref 8.9–10.3)
Chloride: 97 mmol/L — ABNORMAL LOW (ref 98–111)
Creatinine, Ser: 0.53 mg/dL — ABNORMAL LOW (ref 0.61–1.24)
GFR, Estimated: >60 mL/min (ref >60)
Glucose, Bld: 83 mg/dL (ref 70–99)
Potassium: 4.8 mmol/L (ref 3.5–5.1)
Sodium: 136 mmol/L (ref 135–145)

## 2021-06-14 LAB — CBC
HCT: 30.6 % — ABNORMAL LOW (ref 39.0–52.0)
Hemoglobin: 9.9 g/dL — ABNORMAL LOW (ref 13.0–17.0)
MCH: 31 pg (ref 26.0–34.0)
MCHC: 32.4 g/dL (ref 30.0–36.0)
MCV: 95.9 fL (ref 80.0–100.0)
Platelets: 258 10*3/uL (ref 150–400)
RBC: 3.19 MIL/uL — ABNORMAL LOW (ref 4.22–5.81)
RDW: 14.6 % (ref 11.5–15.5)
WBC: 10.6 10*3/uL — ABNORMAL HIGH (ref 4.0–10.5)
nRBC: 0 % (ref 0.0–0.2)

## 2021-06-14 NOTE — Progress Notes (Signed)
?PROGRESS NOTE ? ? ? ?Billy Cherry  YBO:[id number] DOB: 16-Jun-1970 DOA: 03/30/2021 ?PCP: Patient, No Pcp Per (Inactive)  ? ? ?Brief Narrative:  ?51 years old male with past medical history of hypertension was brought into the hospital after being found unresponsive by his wife.   ?1/19 admission, left heart cath without significant CAD.  Concern for anoxic brain injury ?1/21: MRI shows areas of hypoxic-ischemic injury bilaterally. Fevered; cultures obtained. ?1/23 severe brain damage ?1/25: PEG . Neuro exam unchanged.  Does exhibit new eyelid tremor like movements and lip/jaw tremor like vs clonus movement.  Bolused Keppra and increased maintenance dose.  Repeat EEG. CTH: concerning for progression of anoxic injury.  ?1/26: Repeat EEG unchanged and continues to show myoclonic seizures despite addition of Valproic acid yesterday. transfer to [facility] from [place] for continuous EEG ?1/28: Continues to have myoclonic sz on EEG ?2/3 no meaningful neuro recovery. Diuresed ?2/6 New fever to 102, WBC 16K - treated with vancomycin for 7d based on cultures.  ?2/11: percutaneous tracheostomy ?2/13 >2/20 on trach collar. No vent.  ?2/20 Trach change to 6 cuffless ?2/28: fever ?3/13: eliquis started ?4/3: fever ?06/06/2021 CT of the chest with new PE and haziness consistent with pneumonia ?4/5-4/9: responding well to PRN lasix ?4/11 fever again after completion of abx- work up in progress ?  ? ? ?Assessment and Plan: ?* Cardiac arrest [facility]) ?Postarrest anoxic encephalopathy ?Postarrest respiratory failure s/p TC ?S/P PEG tube placement ?Postarrest myoclonus: ?2D echocardiogram showed reduced LV function at <20%.  Now status post trach and PEG.   ?Remains obtunded with anoxic encephalopathy-on Keppra, for myoclonus and can increase Klonopin for breakthrough myoclonus.   ?PCCM managing tracheostomy. ?Disposition = with home health wife arranging care ? ?Anoxic encephalopathy ([name]) ?Continue supportive care.   ?Patient  currently keeps his eyes open, but does not answer questions or follow commands ? ?Acute respiratory failure ([name]) ?Status post trach collar.  Continue supportive care ? ?Recurrent aspiration and fevers ?RLL pneumonia ?Previously had completed vancomycin for MSSA and [name] in early march.  Received more than 7-day course of IV Fortaz. ?On 4/3, CT chest was ordered that indicated an area of alveolar infiltration that may represent pneumonia.  Tracheal aspirate has been sent for culture and is significant for normal respiratory flora. Concerning for aspiration. ?-s/p Unasyn x 7 days ?-If another episode of fever we will reconsult ID ? ?DVT (deep venous thrombosis) ([name]) ?Duplex ultrasound of lower extremity on 05/15/2021 showed age indeterminate deep vein thrombosis involving the left femoral vein, left proximal profunda vein, left posterior tibial veins, left popliteal vein, and left peroneal veins. Initially on Eliquis but now changed to Lovenox ?-Continue Lovenox for now-- back to elquis in 24 to 48 hours ?-repeated duplex (due to fever) and clot has not propagated.   ? ?Dysphagia ?Continue PEG tube feeding.  Tolerating but high risk of aspiration ?-Aspiration precautions ? ?Pulmonary embolus ([name]) ?Noted to have possible acute on chronic pulmonary embolism on CT.  Also have some evidence of possible pulmonary hypertension.  Patient was previously managed on Eliquis which is transitioned to Lovenox ?-Continue Lovenox for now ? ?Pressure injury of skin ?Lateral penis, posterior right heel, right foot. Not present on admission. ? ?HTN (hypertension) ?Blood pressure elevated today. ?-Continue Coreg, amlodipine (restarted 4/10), Cardura, hydralazine and PRN Labetalol,  isosorbide dinitrate 30 mg TID ? ?HFrEF (heart failure with reduced ejection fraction) ([name]) ?2D echocardiogram-LVEF < 20%,  ?-Continue coreg, imdur, spironolactone ?-PRN lasix ? ?Myoclonus ?Seen  by neurology.  Continue current AED  with Klonopin, Keppra. Per neuro notes, suppressing his myoclonus will not ultimately change the prognosis  ? ?Hypernatremia-resolved as of 06/09/2021 ?Resolved with free water flushes ? ?Hyperkalemia ?-resolved ?-titrate up aldactone to prior dose ? ?Overall poor prognosis ? ? ?  Code Status: Full Code ?Family Communication: wife 4/6 ? ?Disposition Plan:  ?Level of care: Progressive ?Status is: Inpatient ?Remains inpatient appropriate because: needs safe d/c plan ?  ? ?Consultants:  ?PCCM ?Neurology ?Palliative care ?ID ?IR ?GI ?cards ? ? ?Subjective: ?Eyes open not responsive to my command-discussed with nursing staff his last 24 hours she states less secretions ? ?Objective: ?Vitals:  ? 06/14/21 1300 06/14/21 1400 06/14/21 1508 06/14/21 1609  ?BP: 125/83 107/76  112/78  ?Pulse: (!) 107 (!) 103 (!) 103 (!) 104  ?Resp: (!) 26 (!) 25 (!) 28 (!) 25  ?Temp:    99.3 ?F (37.4 ?C)  ?TempSrc:    Oral  ?SpO2: 98% 99% 95% 95%  ?Weight:      ?Height:      ? ? ?Intake/Output Summary (Last 24 hours) at 06/14/2021 1637 ?Last data filed at 06/14/2021 1110 ?Gross per 24 hour  ?Intake 300 ml  ?Output 2050 ml  ?Net -1750 ml  ? ? ?Filed Weights  ? 06/11/21 0109 06/12/21 0016 06/13/21 0000  ?Weight: 98 kg 96 kg 94 kg  ? ? ?Examination: ? ? ? ?General: Appearance:     ?Overweight male in no acute distress, trach in place, less secretions   ?   ?Lungs:     No wheezing respirations unlabored  ?Heart:    Tachycardic.  ?  ?MS:   All extremities are intact.  ?  ?Neurologic:   Eyes open, not following commands  ?  ?  ?  ? ? ?Data Reviewed: I have personally reviewed following labs and imaging studies ? ?CBC: ?Recent Labs  ?Lab 06/10/21 ?[id number] 06/11/21 ?0503 06/12/21 ?[id number] 06/13/21 ?0327 06/14/21 ?0359  ?WBC 12.4* 14.0* 14.6* 12.9* 10.6*  ?HGB 11.2* 10.7* 11.0* 9.9* 9.9*  ?HCT 33.9* 32.6* 33.7* 30.4* 30.6*  ?MCV 95.2 94.5 94.7 95.6 95.9  ?PLT 209 201 215 247 258  ? ? ?Basic Metabolic Panel: ?Recent Labs  ?Lab 06/08/21 ?0532 06/09/21 ?0321  06/12/21 ?[id number] 06/13/21 ?0327 06/14/21 ?[id number]  ?NA 138 137 134* 136 136  ?K 4.9 4.5 4.3 4.2 4.8  ?CL 100 99 95* 97* 97*  ?CO2 29 31 31 31  33*  ?GLUCOSE 106* 105* 102* 111* 83  ?BUN 35* 35* 25* 34* 33*  ?CREATININE 0.61 0.67 0.54* 0.63 0.53*  ?CALCIUM 8.8* 8.8* 8.7* 8.4* 8.5*  ? ? ?GFR: ?Estimated Creatinine Clearance: 127.2 mL/min (A) (by C-G formula based on SCr of 0.53 mg/dL (L)). ?Liver Function Tests: ?No results for input(s): AST, ALT, ALKPHOS, BILITOT, PROT, ALBUMIN in the last 168 hours. ? ?No results for input(s): LIPASE, AMYLASE in the last 168 hours. ?No results for input(s): AMMONIA in the last 168 hours. ?Coagulation Profile: ?No results for input(s): INR, PROTIME in the last 168 hours. ?Cardiac Enzymes: ?No results for input(s): CKTOTAL, CKMB, CKMBINDEX, TROPONINI in the last 168 hours. ?BNP (last 3 results) ?No results for input(s): PROBNP in the last 8760 hours. ?HbA1C: ?No results for input(s): HGBA1C in the last 72 hours. ?CBG: ?No results for input(s): GLUCAP in the last 168 hours. ?Lipid Profile: ?No results for input(s): CHOL, HDL, LDLCALC, TRIG, CHOLHDL, LDLDIRECT in the last 72 hours. ?Thyroid Function Tests: ?No results for input(s): TSH, T4TOTAL,  FREET4, T3FREE, THYROIDAB in the last 72 hours. ?Anemia Panel: ?No results for input(s): VITAMINB12, FOLATE, FERRITIN, TIBC, IRON, RETICCTPCT in the last 72 hours. ?Sepsis Labs: ?No results for input(s): PROCALCITON, LATICACIDVEN in the last 168 hours. ? ? ?Recent Results (from the past 240 hour(s))  ?Culture, blood (routine x 2)     Status: None  ? Collection Time: 06/06/21  6:25 AM  ? Specimen: BLOOD RIGHT HAND  ?Result Value Ref Range Status  ? Specimen Description BLOOD RIGHT HAND  Final  ? Special Requests   Final  ?  BOTTLES DRAWN AEROBIC AND ANAEROBIC Blood Culture adequate volume  ? Culture   Final  ?  NO GROWTH 5 DAYS ?Performed at [facility], [place] [address], [place], [place] [address] ?  ? Report Status 06/11/2021 FINAL  Final   ?Culture, blood (routine x 2)     Status: None  ? Collection Time: 06/06/21  6:38 AM  ? Specimen: BLOOD RIGHT HAND  ?Result Value Ref Range Status  ? Specimen Description BLOOD RIGHT HAND  Final  ? Special Requests   Final

## 2021-06-15 MED ORDER — APIXABAN 5 MG PO TABS
10.0000 mg | ORAL_TABLET | Freq: Two times a day (BID) | ORAL | Status: DC
Start: 2021-06-15 — End: 2021-06-16
  Administered 2021-06-15: 10 mg via ORAL
  Filled 2021-06-15: qty 2

## 2021-06-15 MED ORDER — FREE WATER
300.0000 mL | Status: DC
  Administered 2021-06-15 – 2021-07-15 (×176): 300 mL

## 2021-06-15 MED ORDER — APIXABAN 5 MG PO TABS: 5.0000 mg | ORAL_TABLET | Freq: Two times a day (BID) | ORAL | Status: DC

## 2021-06-15 NOTE — TOC Progression Note (Addendum)
Transition of Care (TOC) - Progression Note  ? ? ?Patient Details  ?Name: Billy Cherry ?MRN: [id number] ?Date of Birth: 1971/02/14 ? ?Transition of Care (TOC) CM/SW Contact  ?Leone Haven, RN ?Phone Number: ?06/15/2021, 11:38 AM ? ?Clinical Narrative:    ?NCM  spoke with [name] with [facility] ,he states we are still waiting on authorization from [facility] for skilled nursing private duty they are asking for 84 hrs per week for auth.  Wife has been trained on care, [employer] will supply DME . Auth was submitted on 4/6. The goal is 4/20 to start care per [name]. ? ? ?Expected Discharge Plan: Home w Home Health Services ?Barriers to Discharge: Barriers Resolved ? ?Expected Discharge Plan and Services ?Expected Discharge Plan: Home w Home Health Services ?In-house Referral: Hospice / Palliative Care ?Discharge Planning Services: CM Consult ?Post Acute Care Choice: Long Term Acute Care (LTAC) ?Living arrangements for the past 2 months: Single Family Home ?                ?DME Arranged: N/A ?DME Agency: [employer] ?  ?  ?  ?HH Arranged: Nurse's Aide ?HH Agency: [facility] ?  ?  ?Representative spoke with at [facility] Agency: [name] ? ? ?Social Determinants of Health (SDOH) Interventions ?  ? ?Readmission Risk Interventions ? ?  04/14/2021  ? 11:14 AM  ?Readmission Risk Prevention Plan  ?Transportation Screening Complete  ?HRI or Home Care Consult Complete  ?Social Work Consult for Recovery Care Planning/Counseling Complete  ?Palliative Care Screening Complete  ?Medication Review Oceanographer) Complete  ? ? ?

## 2021-06-15 NOTE — Progress Notes (Signed)
?PROGRESS NOTE ? ? ? ?Billy Cherry  [id number] DOB: 07/05/1970 DOA: 03/30/2021 ?PCP: Patient, No Pcp Per (Inactive)  ? ? ?Brief Narrative:  ?51 years old male with past medical history of hypertension was brought into the hospital after being found unresponsive by his wife.   ?1/19 admission, left heart cath without significant CAD.  Concern for anoxic brain injury ?1/21: MRI shows areas of hypoxic-ischemic injury bilaterally. Fevered; cultures obtained. ?1/23 severe brain damage ?1/25: PEG . Neuro exam unchanged.  Does exhibit new eyelid tremor like movements and lip/jaw tremor like vs clonus movement.  Bolused Keppra and increased maintenance dose.  Repeat EEG. CTH: concerning for progression of anoxic injury.  ?1/26: Repeat EEG unchanged and continues to show myoclonic seizures despite addition of Valproic acid yesterday. transfer to [name] from [place] for continuous EEG ?1/28: Continues to have myoclonic sz on EEG ?2/3 no meaningful neuro recovery. Diuresed ?2/6 New fever to 102, WBC 16K - treated with vancomycin for 7d based on cultures.  ?2/11: percutaneous tracheostomy ?2/13 >2/20 on trach collar. No vent.  ?2/20 Trach change to 6 cuffless ?2/28: fever ?3/13: eliquis started ?4/3: fever ?06/06/2021 CT of the chest with new PE and haziness consistent with pneumonia ?4/5-4/9: responding well to PRN lasix ?4/11 fever again after completion of abx- work up in progress ?4/13 continues to have low-grade temps cultures from 4/11 no growth to date-suspect silent/occult aspiration--- ID will be reengaged in the morning 4/14-discussed with wife that we need to have a family meeting this weekend ?  ? ? ?Assessment and Plan: ?* Cardiac arrest [facility]) ?Postarrest anoxic encephalopathy ?Postarrest respiratory failure s/p TC ?S/P PEG tube placement ?Postarrest myoclonus: ?2D echocardiogram showed reduced LV function at <20%.  Now status post trach and PEG.   ?Obtunded 2/2 anoxic encephalopathy-continue Keppra 750  twice daily, Depakote 750 3 times daily, as needed clonazepam 0.5 3 times daily or myoclonus and can increase Klonopin for breakthrough myoclonus.   ?PCCM managing tracheostomy. ?Disposition = with home health wife arranging care  ?I have a meeting scheduled with her on 4/15 to discuss overall issues ? ?Anoxic encephalopathy ([place]) ?Continue supportive care--- patient is nonresponsive ? ?Acute respiratory failure ([place]) ?Status post trach collar.  Continue supportive care.  CCM evaluating q. weekly ? ?Recurrent aspiration and fevers ?RLL pneumonia ?Previously had completed vancomycin for MSSA and [name] in early march.  Received more than 7-day course of IV Fortaz. ?On 4/3, CT chest was ordered that indicated an area of alveolar infiltration that may represent pneumonia.  Tracheal aspirate has been sent for culture and is significant for normal respiratory flora. Concerning for aspiration. ?-s/p Unasyn x 7 days ?-If another episode of fever >101 we will reconsult ID ? ?DVT (deep venous thrombosis) ([place]) ?Duplex ultrasound of lower extremity on 05/15/2021 showed age indeterminate deep vein thrombosis involving the left femoral vein, left proximal profunda vein, left posterior tibial veins, left popliteal vein, and left peroneal veins.  ?Heparin has been changed back to Eliquis 4/13 ?-repeated duplex 4/11 (due to fever) and clot has not propagated.   ? ?Dysphagia with exceedingly high risk of aspiration and silent aspiration ?Continue PEG tube feeding.  Tolerating but high risk of aspiration ?-Aspiration precautions ? ?Pulmonary embolus ([place]) ?some evidence of possible pulmonary hypertension.  ?See above discussion ? ?Pressure injury of skin ?Lateral penis, posterior right heel, right foot. Not present on admission. ?We will review ? ?HTN (hypertension) ?HFrEF (heart failure with reduced ejection fraction) ([place]) ?-Continue Coreg 25  twice daily, amlodipine (restarted 4/10) 2.5, Cardura 2 mg,  hydralazine and PRN Labetalol,  isosorbide dinitrate 30 mg TID ?2D echocardiogram-LVEF < 20%,  ?-PRN lasix ? ?Myoclonus ?Seen by neurology.  Continue current AED with Klonopin, Keppra. Per neuro notes, suppressing his myoclonus will not ultimately change the prognosis  ? ?Hypernatremia-resolved as of 06/09/2021 ?Resolved with free water flushes ? ?Hyperkalemia ?-resolved ?-titrate up aldactone to prior dose when able ? ?Overall very poor prognosis-I had a brief telephonic discussion with the patient's wife 4/13 and asked that we meet in person so that she could see for herself how her husband is doing-she indicates that she would be willing to come by on 4/15 ? ? ?  Code Status: Full Code ?Family Communication: wife 4/6 ? ?Disposition Plan:  ?Level of care: Progressive ?Status is: Inpatient ?Remains inpatient appropriate because: needs safe d/c plan ?  ? ?Consultants:  ?PCCM ?Neurology ?Palliative care ?ID ?IR ?GI ?cards ? ? ?Subjective: ? ?Unresponsive eyes open but does not do much else ? ?Objective: ?Vitals:  ? 06/15/21 0007 06/15/21 0335 06/15/21 0740 06/15/21 1118  ?BP: 102/77 102/69 111/66 127/89  ?Pulse:   95 97  ?Resp:   (!) 25 (!) 26  ?Temp: 99.5 ?F (37.5 ?C) 99.4 ?F (37.4 ?C) 99.6 ?F (37.6 ?C) 99.5 ?F (37.5 ?C)  ?TempSrc: Axillary Axillary Oral Oral  ?SpO2:   95% 95%  ?Weight:  94 kg    ?Height:      ? ? ?Intake/Output Summary (Last 24 hours) at 06/15/2021 1603 ?Last data filed at 06/15/2021 1513 ?Gross per 24 hour  ?Intake --  ?Output 1475 ml  ?Net -1475 ml  ? ? ?Filed Weights  ? 06/12/21 0016 06/13/21 0000 06/15/21 0335  ?Weight: 96 kg 94 kg 94 kg  ? ? ?Examination: ? ? ?Eyes open ?Does not have any meaningful movement ?Trach in place #6 [place] ?Chest is clear posterolaterally after help with NT determine the patient-he does have a stage I decubitus with a protective pad ?I did not examine his lower extremities or his heel decubiti today ?Overall he is unchanged from prior exam yesterday ?  ?  ? ? ?Data  Reviewed: I have personally reviewed following labs and imaging studies ? ?CBC: ?Recent Labs  ?Lab 06/10/21 ?[id number] 06/11/21 ?0503 06/12/21 ?[id number] 06/13/21 ?0327 06/14/21 ?0359  ?WBC 12.4* 14.0* 14.6* 12.9* 10.6*  ?HGB 11.2* 10.7* 11.0* 9.9* 9.9*  ?HCT 33.9* 32.6* 33.7* 30.4* 30.6*  ?MCV 95.2 94.5 94.7 95.6 95.9  ?PLT 209 201 215 247 258  ? ? ?Basic Metabolic Panel: ?Recent Labs  ?Lab 06/09/21 ?0321 06/12/21 ?0232 06/13/21 ?0327 06/14/21 ?[id number]  ?NA 137 134* 136 136  ?K 4.5 4.3 4.2 4.8  ?CL 99 95* 97* 97*  ?CO2 31 31 31  33*  ?GLUCOSE 105* 102* 111* 83  ?BUN 35* 25* 34* 33*  ?CREATININE 0.67 0.54* 0.63 0.53*  ?CALCIUM 8.8* 8.7* 8.4* 8.5*  ? ? ?GFR: ?Estimated Creatinine Clearance: 127.2 mL/min (A) (by C-G formula based on SCr of 0.53 mg/dL (L)). ?Liver Function Tests: ?No results for input(s): AST, ALT, ALKPHOS, BILITOT, PROT, ALBUMIN in the last 168 hours. ? ?No results for input(s): LIPASE, AMYLASE in the last 168 hours. ?No results for input(s): AMMONIA in the last 168 hours. ?Coagulation Profile: ?No results for input(s): INR, PROTIME in the last 168 hours. ?Cardiac Enzymes: ?No results for input(s): CKTOTAL, CKMB, CKMBINDEX, TROPONINI in the last 168 hours. ?BNP (last 3 results) ?No results for input(s): PROBNP in the last 8760  hours. ?HbA1C: ?No results for input(s): HGBA1C in the last 72 hours. ?CBG: ?No results for input(s): GLUCAP in the last 168 hours. ?Lipid Profile: ?No results for input(s): CHOL, HDL, LDLCALC, TRIG, CHOLHDL, LDLDIRECT in the last 72 hours. ?Thyroid Function Tests: ?No results for input(s): TSH, T4TOTAL, FREET4, T3FREE, THYROIDAB in the last 72 hours. ?Anemia Panel: ?No results for input(s): VITAMINB12, FOLATE, FERRITIN, TIBC, IRON, RETICCTPCT in the last 72 hours. ?Sepsis Labs: ?No results for input(s): PROCALCITON, LATICACIDVEN in the last 168 hours. ? ? ?Recent Results (from the past 240 hour(s))  ?Culture, blood (routine x 2)     Status: None  ? Collection Time: 06/06/21  6:25 AM  ?  Specimen: BLOOD RIGHT HAND  ?Result Value Ref Range Status  ? Specimen Description BLOOD RIGHT HAND  Final  ? Special Requests   Final  ?  BOTTLES DRAWN AEROBIC AND ANAEROBIC Blood Culture adequate volume  ? Culture   [place]

## 2021-06-15 NOTE — Progress Notes (Signed)
ANTICOAGULATION CONSULT NOTE ?Pharmacy Consult for Lovenox ?Indication: pulmonary embolus ? ?Allergies  ?Allergen Reactions  ? Lisinopril Swelling  ?  angioedema  ? ? ?Patient Measurements: ?Height:  (bed scale not working) ?Weight: 94 kg (207 lb 3.7 oz) ?IBW/kg (Calculated) : 73 ?Heparin Dosing Weight: 93 ? ?Vital Signs: ?Temp: 99.6 ?F (37.6 ?C) (04/13 0740) ?Temp Source: Oral (04/13 0740) ?BP: 111/66 (04/13 0740) ?Pulse Rate: 95 (04/13 0740) ? ?Labs: ?Recent Labs  ?  06/13/21 ?0327 06/14/21 ?0359  ?HGB 9.9* 9.9*  ?HCT 30.4* 30.6*  ?PLT 247 258  ?CREATININE 0.63 0.53*  ? ? ? ?Estimated Creatinine Clearance: 127.2 mL/min (A) (by C-G formula based on SCr of 0.53 mg/dL (L)). ? ? ?Medical History: ?Past Medical History:  ?Diagnosis Date  ? Hypertension   ? ? ?Assessment: 50 yoM status post tracheostomy and PEG tube placement found to have new PE. 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 Patient has been on full treatment dose Eliquis since 3/13 after he was found to have LE DVTs. No missed or held doses. At this time is considered a treatment failure.  ?Pharmacy consulted to start Lovenox on 4/4. ?CBC stable, no bleeding noted, SCr stable, wt 94kg (fluctuating between 91-99 kg the last few weeks) ? ?Goal of Therapy:  ?Anti-Xa 0.6-1 ?Monitor platelets by anticoagulation protocol: Yes ?  ?Plan:  ?Continue Lovenox 100mg  SQ BID ?Monitor renal function, CBC and for signs and symptoms of bleeding ?F/u with PO plan ? ? ? ?Thank you for allowing to participate in this patients care. ? ?[place] PharmD., BCPS ?Clinical Pharmacist ?06/15/2021 10:19 AM ? ?

## 2021-06-16 MED ORDER — GLYCOPYRROLATE 1 MG PO TABS
1.0000 mg | ORAL_TABLET | Freq: Two times a day (BID) | ORAL | Status: DC | PRN
Start: 2021-06-16 — End: 2021-07-07
  Administered 2021-06-19 – 2021-06-28 (×8): 1 mg via ORAL
  Filled 2021-06-16 (×11): qty 1

## 2021-06-16 MED ORDER — AMLODIPINE BESYLATE 10 MG PO TABS: 10.0000 mg | ORAL_TABLET | Freq: Every day | ORAL | Status: DC

## 2021-06-16 MED ORDER — SPIRONOLACTONE 25 MG PO TABS
25.0000 mg | ORAL_TABLET | Freq: Every day | ORAL | Status: DC
  Administered 2021-06-16 – 2021-06-19 (×4): 25 mg
  Filled 2021-06-16 (×4): qty 1

## 2021-06-16 MED ORDER — APIXABAN 5 MG PO TABS
10.0000 mg | ORAL_TABLET | Freq: Two times a day (BID) | ORAL | Status: AC
  Administered 2021-06-16 – 2021-06-22 (×13): 10 mg
  Filled 2021-06-16 (×13): qty 2

## 2021-06-16 MED ORDER — AMLODIPINE BESYLATE 10 MG PO TABS
10.0000 mg | ORAL_TABLET | Freq: Every day | ORAL | Status: DC
  Administered 2021-06-16 – 2021-06-28 (×13): 10 mg
  Filled 2021-06-16 (×13): qty 1

## 2021-06-16 MED ORDER — APIXABAN 5 MG PO TABS
5.0000 mg | ORAL_TABLET | Freq: Two times a day (BID) | ORAL | Status: DC
  Administered 2021-06-22 – 2021-10-04 (×208): 5 mg
  Filled 2021-06-16 (×210): qty 1

## 2021-06-16 NOTE — Progress Notes (Signed)
?PROGRESS NOTE ? ? ? ?Billy Cherry  [id number] DOB: 1970/03/24 DOA: 03/30/2021 ?PCP: Patient, No Pcp Per (Inactive)  ? ? ?Brief Narrative:  ?51 years old male with past medical history of hypertension was brought into the hospital after being found unresponsive by his wife.   ?1/19 admission, left heart cath without significant CAD.  Concern for anoxic brain injury ?1/21: MRI shows areas of hypoxic-ischemic injury bilaterally. Fevered; cultures obtained. ?1/23 severe brain damage ?1/25: PEG . Neuro exam unchanged.  Does exhibit new eyelid tremor like movements and lip/jaw tremor like vs clonus movement.  Bolused Keppra and increased maintenance dose.  Repeat EEG. CTH: concerning for progression of anoxic injury.  ?1/26: Repeat EEG unchanged and continues to show myoclonic seizures despite addition of Valproic acid yesterday. transfer to [name] from [place] for continuous EEG ?1/28: Continues to have myoclonic sz on EEG ?2/3 no meaningful neuro recovery. Diuresed ?2/6 New fever to 102, WBC 16K - treated with vancomycin for 7d based on cultures.  ?2/11: percutaneous tracheostomy ?2/13 >2/20 on trach collar. No vent.  ?2/20 Trach change to 6 cuffless ?2/28: fever ?3/13: eliquis started ?4/3: fever ?06/06/2021 CT of the chest with new PE and haziness consistent with pneumonia ?4/5-4/9: responding well to PRN lasix ?4/11 fever again after completion of abx- work up in progress ?4/13 continues to have low-grade temps cultures from 4/11 no growth to date-suspect silent/occult aspiration-heparin changed back to Eliquis 4/13 ?  ? ? ?Assessment and Plan: ?* Cardiac arrest [facility]) ?Postarrest anoxic encephalopathy ?Postarrest respiratory failure s/p TC ?S/P PEG tube placement ?Postarrest myoclonus: ?2D echocardiogram showed reduced LV function at <20%.  Now status post trach and PEG.   ?Obtunded 2/2 anoxic encephalopathy-continue Keppra 750 twice daily, Depakote 750 3 times daily, as needed clonazepam 0.5 3 times daily  or myoclonus and can increase Klonopin for breakthrough myoclonus.   ?PCCM managing tracheostomy. ?Disposition = with home health wife arranging care -- ? meeting scheduled with her on 4/15 to discuss overall issues--she will need to exhibit good understanding of trach care prior to his d/c home ? ?Anoxic encephalopathy ([place]) ?Continue supportive care--- patient is nonresponsive ? ?Acute respiratory failure ([place]) ?Status post trach collar.  Continue supportive care.  CCM evaluating q. weekly ? ?Recurrent aspiration and fevers ?RLL pneumonia ?Previously had completed vancomycin for MSSA and [name] in early march.  Received more than 7-day course of IV Fortaz. ?On 4/3, CT chest was ordered that indicated an area of alveolar infiltration that may represent pneumonia.  Tracheal aspirate has been sent for culture and is significant for normal respiratory flora. Concerning for aspiration. ?-s/p Unasyn x 7 days ?-If another episode of fever >101 we will reconsult ID ?-low grade temps 4/13 ? ?DVT (deep venous thrombosis) ([place]) ?Duplex ultrasound of lower extremity on 05/15/2021 showed age indeterminate deep vein thrombosis involving the left femoral vein, left proximal profunda vein, left posterior tibial veins, left popliteal vein, and left peroneal veins.  ?Heparin has been changed back to Eliquis 4/13 ?-repeated duplex 4/11 (due to fever) and clot has not propagated.   ? ?Dysphagia with exceedingly high risk of aspiration and silent aspiration ?Continue PEG tube feeding.  Tolerating but high risk of aspiration ?-Aspiration precautions ? ?Pulmonary embolus ([place]) ?some evidence of possible pulmonary hypertension.  ?See above discussion ? ?Pressure injury of skin ?Lateral penis, posterior right heel, right foot. Not present on admission. ?We will review ? ?HTN (hypertension) ?HFrEF (heart failure with reduced ejection fraction) ([place]) ?-Continue Coreg  25 twice daily, amlodipine (restarted 4/10) 2.5,  Cardura 2 mg, hydralazine and PRN Labetalol,  isosorbide dinitrate 30 mg TID ?2D echocardiogram-LVEF < 20%,  ?-PRN lasix ? ?Myoclonus ?Seen by neurology.  Continue current AED with Klonopin, Keppra. Per neuro notes, suppressing his myoclonus will not ultimately change the prognosis  ? ?Hypernatremia-resolved as of 06/09/2021 ?Resolved with free water flushes ? ?Hyperkalemia ?-resolved ?-titrate up aldactone to prior dose when able ? ?Overall very poor prognosis-Id/w patient's wife 4/13 and asked that we meet in person so that she could see for herself how her husband is doing-she indicates that she would be willing to come by on 4/15 ? ?apixaban (ELIQUIS) tablet 10 mg  ?apixaban (ELIQUIS) tablet 5 mg  ?  Code Status: Full Code ?Family Communication: wife 4/6 ? ?Disposition Plan:  ?Level of care: Progressive ?Status is: Inpatient ?Remains inpatient appropriate because: needs safe d/c plan ?  ? ?Consultants:  ?PCCM ?Neurology ?Palliative care ?ID ?IR ?GI ?cards ? ? ?Subjective: ? ?Unresponsive eyes open ?Thick secretions today white sputum ?Re-positioned and suctioned several times ?Adding Glycopyrrolate today ? ?Objective: ?Vitals:  ? 06/16/21 0956 06/16/21 1100 06/16/21 1125 06/16/21 1200  ?BP: (!) 154/112 (!) 132/92 (!) 147/107 (!) 142/99  ?Pulse:  96 100 95  ?Resp: 20 (!) 28 (!) 27 (!) 21  ?Temp:   99.7 ?F (37.6 ?C)   ?TempSrc: Oral  Oral   ?SpO2:  97% 98% 100%  ?Weight:      ?Height:      ? ? ?Intake/Output Summary (Last 24 hours) at 06/16/2021 1749 ?Last data filed at 06/16/2021 1125 ?Gross per 24 hour  ?Intake --  ?Output 1300 ml  ?Net -1300 ml  ? ? ?Filed Weights  ? 06/12/21 0016 06/13/21 0000 06/15/21 0335  ?Weight: 96 kg 94 kg 94 kg  ? ? ?Examination: ? ? ?Eyes open--thick secretions  ?Does not have any meaningful movement ?Trach in place #6 [place] ?Chest is clear posterolaterally after help with NT determine the patient-he does have a stage I decubitus with a protective pad ?I did not examine his lower  extremities or his heel decubiti today ? ?  ?  ? ? ?Data Reviewed: I have personally reviewed following labs and imaging studies ? ?CBC: ?Recent Labs  ?Lab 06/10/21 ?[id number] 06/11/21 ?0503 06/12/21 ?[id number] 06/13/21 ?0327 06/14/21 ?0359  ?WBC 12.4* 14.0* 14.6* 12.9* 10.6*  ?HGB 11.2* 10.7* 11.0* 9.9* 9.9*  ?HCT 33.9* 32.6* 33.7* 30.4* 30.6*  ?MCV 95.2 94.5 94.7 95.6 95.9  ?PLT 209 201 215 247 258  ? ? ?Basic Metabolic Panel: ?Recent Labs  ?Lab 06/12/21 ?0232 06/13/21 ?0327 06/14/21 ?[id number]  ?NA 134* 136 136  ?K 4.3 4.2 4.8  ?CL 95* 97* 97*  ?CO2 31 31 33*  ?GLUCOSE 102* 111* 83  ?BUN 25* 34* 33*  ?CREATININE 0.54* 0.63 0.53*  ?CALCIUM 8.7* 8.4* 8.5*  ? ? ?GFR: ?Estimated Creatinine Clearance: 127.2 mL/min (A) (by C-G formula based on SCr of 0.53 mg/dL (L)). ?Liver Function Tests: ?No results for input(s): AST, ALT, ALKPHOS, BILITOT, PROT, ALBUMIN in the last 168 hours. ? ?No results for input(s): LIPASE, AMYLASE in the last 168 hours. ?No results for input(s): AMMONIA in the last 168 hours. ?Coagulation Profile: ?No results for input(s): INR, PROTIME in the last 168 hours. ?Cardiac Enzymes: ?No results for input(s): CKTOTAL, CKMB, CKMBINDEX, TROPONINI in the last 168 hours. ?BNP (last 3 results) ?No results for input(s): PROBNP in the last 8760 hours. ?HbA1C: ?No results for input(s): HGBA1C in  the last 72 hours. ?CBG: ?No results for input(s): GLUCAP in the last 168 hours. ?Lipid Profile: ?No results for input(s): CHOL, HDL, LDLCALC, TRIG, CHOLHDL, LDLDIRECT in the last 72 hours. ?Thyroid Function Tests: ?No results for input(s): TSH, T4TOTAL, FREET4, T3FREE, THYROIDAB in the last 72 hours. ?Anemia Panel: ?No results for input(s): VITAMINB12, FOLATE, FERRITIN, TIBC, IRON, RETICCTPCT in the last 72 hours. ?Sepsis Labs: ?No results for input(s): PROCALCITON, LATICACIDVEN in the last 168 hours. ? ? ?Recent Results (from the past 240 hour(s))  ?Culture, blood (Routine X 2) w Reflex to ID Panel     Status: None (Preliminary  result)  ? Collection Time: 06/13/21  8:58 AM  ? Specimen: BLOOD RIGHT HAND  ?Result Value Ref Range Status  ? Specimen Description BLOOD RIGHT HAND  Final  ? Special Requests   Final  ?  BOTTLES DRAWN AEROBIC AND ANAE

## 2021-06-16 NOTE — Progress Notes (Signed)
Mobility Specialist Progress Note: ? ? 06/16/21 1540  ?Mobility  ?Activity Turned to left side;Turned to right side;Turned to back - supine  ?Level of Assistance Total care  ?Activity Response Tolerated well  ?$Mobility charge 1 Mobility  ? ?RN requesting assistance to reposition pt in bed. Pt requiring total care. Left with wedge under right side.  ? ?Billy Cherry ?Acute Rehab ?Phone: [phone] ?Office Phone: [phone] ? ?

## 2021-06-16 NOTE — Progress Notes (Signed)
?   06/16/21 0444  ?Assess: MEWS Score  ?BP (!) 150/102  ?Pulse Rate (!) 102  ?Assess: MEWS Score  ?MEWS Temp 0  ?MEWS Systolic 0  ?MEWS Pulse 1  ?MEWS RR 2  ?MEWS LOC 2  ?MEWS Score 5  ?MEWS Score Color Red  ?Assess: if the MEWS score is Yellow or Red  ?Were vital signs taken at a resting state? Yes  ?Focused Assessment No change from prior assessment  ?Early Detection of Sepsis Score *See Row Information* Low  ?MEWS guidelines implemented *See Row Information* No, previously red, continue vital signs every 4 hours  ?Document  ?Patient Outcome Stabilized after interventions;Other (Comment)  ?Progress note created (see row info) Yes  ? ? ?

## 2021-06-16 NOTE — Progress Notes (Signed)
?   06/15/21 2336  ?Assess: MEWS Score  ?Temp 99.6 ?F (37.6 ?C)  ?BP (!) 162/108  ?Pulse Rate (!) 102  ?Resp (!) 26  ?SpO2 92 %  ?O2 Device Tracheostomy Collar  ?O2 Flow Rate (L/min) 5 L/min  ?FiO2 (%) 28 %  ?Assess: MEWS Score  ?MEWS Temp 0  ?MEWS Systolic 0  ?MEWS Pulse 1  ?MEWS RR 2  ?MEWS LOC 1  ?MEWS Score 4  ?MEWS Score Color Red  ?Assess: if the MEWS score is Yellow or Red  ?Were vital signs taken at a resting state? Yes  ?Focused Assessment No change from prior assessment  ?Early Detection of Sepsis Score *See Row Information* Low  ?MEWS guidelines implemented *See Row Information* No, other (Comment) ?(pt has BP meds to help, when pt coughs respirations go up)  ?Document  ?Patient Outcome Stabilized after interventions  ?Progress note created (see row info) Yes  ? ? ?

## 2021-06-16 NOTE — Progress Notes (Signed)
Patient seen today by trach team for consult.  No education is needed at this time.  All necessary equipment is at beside.   Will continue to follow for progression.  

## 2021-06-17 ENCOUNTER — Inpatient Hospital Stay (HOSPITAL_COMMUNITY): Payer: BC Managed Care – PPO

## 2021-06-17 NOTE — Progress Notes (Signed)
?PROGRESS NOTE ? ? ? ?Billy Cherry  [id number] DOB: 1970-04-26 DOA: 03/30/2021 ?PCP: Patient, No Pcp Per (Inactive)  ? ? ?Brief Narrative:  ?51 years old male with past medical history of hypertension was brought into the hospital after being found unresponsive by his wife.   ?1/19 admission, left heart cath without significant CAD.  Concern for anoxic brain injury ?1/21: MRI shows areas of hypoxic-ischemic injury bilaterally. Fevered; cultures obtained. ?1/23 severe brain damage ?1/25: PEG . Neuro exam unchanged.  Does exhibit new eyelid tremor like movements and lip/jaw tremor like vs clonus movement.  Bolused Keppra and increased maintenance dose.  Repeat EEG. CTH: concerning for progression of anoxic injury.  ?1/26: Repeat EEG unchanged and continues to show myoclonic seizures despite addition of Valproic acid yesterday. transfer to [name] from [place] for continuous EEG ?1/28: Continues to have myoclonic sz on EEG ?2/3 no meaningful neuro recovery. Diuresed ?2/6 New fever to 102, WBC 16K - treated with vancomycin for 7d based on cultures.  ?2/11: percutaneous tracheostomy ?2/13 >2/20 on trach collar. No vent.  ?2/20 Trach change to 6 cuffless ?2/28: fever ?3/13: eliquis started ?4/3: fever ?06/06/2021 CT of the chest with new PE and haziness consistent with pneumonia ?4/5-4/9: responding well to PRN lasix ?4/11 fever again after completion of abx- work up in progress ?4/13 continues to have low-grade temps cultures from 4/11 no growth to date-suspect silent/occult aspiration-heparin changed back to Eliquis 4/13 ?4/14: Work-up negative with no growth to date but red menance Tmax elevated 99.4 and respiratory rate up-x-ray did not show any pneumonia but probably silently aspirating ?  ? ? ?Assessment and Plan: ?* Cardiac arrest [facility]) ?Postarrest anoxic encephalopathy ?Postarrest respiratory failure s/p TC ?S/P PEG tube placement ?Postarrest myoclonus: ?2D echocardiogram showed reduced LV function at <20%.   Now status post trach and PEG.   ?Obtunded 2/2 anoxic encephalopathy- ?continue Keppra 750 twice daily, Depakote 750 3 times daily, as needed clonazepam 0.5 3 times daily or myoclonus and can increase Klonopin for breakthrough myoclonus.   ?PCCM managing tracheostomy. ?Disposition = with home health wife arranging care -- ?Meeting was slated for 4/15 to discuss overall care issues-she has not arrived and has not shown up for this meeting and patient continues to have low-grade fevers and tachypnea ?Overall prognosis is abysmal ? 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 ?Acute respiratory failure ([place]) ?Status post trach collar.  Continue supportive care.  CCM evaluating q. weekly ? ?Recurrent aspiration and fevers ?RLL pneumonia ?Previously had completed vancomycin for MSSA and [name] in early march.  Received more than 7-day course of IV Fortaz. ?On 4/3, CT chest was ordered that indicated an area of alveolar infiltration that may represent pneumonia.  Tracheal aspirate has been sent for culture and is significant for normal respiratory flora. Concerning for aspiration. ?-s/p Unasyn x 7 days ?-If another episode of fever >101 we will reconsult ID ?-low grade temps 4/13 and further tachypnea with increased work of breathing and secretions, in addition to scopolamine patch we have added glycopyrrolate twice daily scheduled to see if this makes a difference to his secretions (it probably will not) ? ?DVT (deep venous thrombosis) ([place]) ?Duplex ultrasound of lower extremity on 05/15/2021 showed age indeterminate deep vein thrombosis involving the left femoral vein, left proximal profunda vein, left posterior tibial veins, left popliteal vein, and left peroneal veins.  ?Heparin has been changed back to Eliquis 4/13 ?-repeated duplex 4/11 (due to fever) and clot has not  propagated.   ? ?Dysphagia with exceedingly high risk of aspiration and silent  aspiration ?Continue PEG tube feeding.  Tolerating but high risk of aspiration ?-Aspiration precautions ? ?Pulmonary embolus ([place]) ?some evidence of possible pulmonary hypertension.  ?See above discussion ? ?Pressure injury of skin ?Lateral penis, posterior right heel, right foot. Not present on admission. ?We will review ? ?HTN (hypertension) ?HFrEF (heart failure with reduced ejection fraction) ([place]) ?-Continue Coreg 25 twice daily, amlodipine (restarted 4/10) 2.5, Cardura 2 mg, hydralazine and PRN Labetalol,  isosorbide dinitrate 30 mg TID ?2D echocardiogram-LVEF < 20%,  ?-PRN lasix ? ?Myoclonus ?Seen by neurology.  Continue current AED with Klonopin, Keppra. Per neuro notes, suppressing his myoclonus will not ultimately change the prognosis  ? ?Hypernatremia-resolved as of 06/09/2021 ?Resolved with free water flushes ? ?Hyperkalemia ?-resolved ?-titrate up aldactone to prior dose when able ? ? ?apixaban (ELIQUIS) tablet 10 mg  ?apixaban (ELIQUIS) tablet 5 mg  ?  Code Status: Full Code-patient's prognosis is exceedingly poor as previously documented ?Family Communication: wife and I spoke on 4/13 I advised her to come into the hospital on 4/15 for family meeting-I do not think she understands completely the actual scenario and I would like for her to show up so that we can face-to-face examined the patient together and I can explain to her in person-I will call her again in a.m. 4/16 to set up another appointment ? ?Disposition Plan:  ?Level of care: Progressive ?Status is: Inpatient ?Remains inpatient appropriate because: needs safe d/c plan ?  ? ?Consultants:  ?PCCM ?Neurology ?Palliative care ?ID ?IR ?GI ?cards ? ? ?Subjective: ? ?Secretions are less he has been suctioned well however he remains unresponsive and unable to give review of systems-he reacts to pain and reacts reflexively to menance saying and noxious stimuli ? ?Objective: ?Vitals:  ? 06/17/21 1500 06/17/21 1551 06/17/21 1600 06/17/21 1700  ?BP:  99/68 99/68 104/65 106/72  ?Pulse: 98 100  99  ?Resp: (!) 30 (!) 36 (!) 30 (!) 26  ?Temp:    99.4 ?F (37.4 ?C)  ?TempSrc:    Oral  ?SpO2: 97% 96% 93% 99%  ?Weight:      ?Height:      ? ? ?Intake/Output Summary (Last 24 hours) at 06/17/2021 1739 ?Last data filed at 06/17/2021 1200 ?Gross per 24 hour  ?Intake 0 ml  ?Output 650 ml  ?Net -650 ml  ? ? ?Filed Weights  ? 06/13/21 0000 06/15/21 0335 06/17/21 0009  ?Weight: 94 kg 94 kg 96 kg  ? ? ?Examination: ? ? ?Eyes open--no meaningful movement ?Trach in place #6 [place] ?Chest is clear posterolaterally ?Decubitus ulcer on right lower extremity noted below first metatarsophalangeal joint-other foot is clean ?  ?  ? ? ?Data Reviewed: I have personally reviewed following labs and imaging studies ? ?CBC: ?Recent Labs  ?Lab 06/11/21 ?0503 06/12/21 ?[id number] 06/13/21 ?0327 06/14/21 ?0359  ?WBC 14.0* 14.6* 12.9* 10.6*  ?HGB 10.7* 11.0* 9.9* 9.9*  ?HCT 32.6* 33.7* 30.4* 30.6*  ?MCV 94.5 94.7 95.6 95.9  ?PLT 201 215 247 258  ? ? ?Basic Metabolic Panel: ?Recent Labs  ?Lab 06/12/21 ?0232 06/13/21 ?0327 06/14/21 ?[id number]  ?NA 134* 136 136  ?K 4.3 4.2 4.8  ?CL 95* 97* 97*  ?CO2 31 31 33*  ?GLUCOSE 102* 111* 83  ?BUN 25* 34* 33*  ?CREATININE 0.54* 0.63 0.53*  ?CALCIUM 8.7* 8.4* 8.5*  ? ? ?GFR: ?Estimated Creatinine Clearance: 128.4 mL/min (A) (by C-G formula based on SCr of 0.53  mg/dL (L)). ?Liver Function Tests: ?No results for input(s): AST, ALT, ALKPHOS, BILITOT, PROT, ALBUMIN in the last 168 hours. ? ?No results for input(s): LIPASE, AMYLASE in the last 168 hours. ?No results for input(s): AMMONIA in the last 168 hours. ?Coagulation Profile: ?No results for input(s): INR, PROTIME in the last 168 hours. ?Cardiac Enzymes: ?No results for input(s): CKTOTAL, CKMB, CKMBINDEX, TROPONINI in the last 168 hours. ?BNP (last 3 results) ?No results for input(s): PROBNP in the last 8760 hours. ?HbA1C: ?No results for input(s): HGBA1C in the last 72 hours. ?CBG: ?No results for input(s): GLUCAP in the  last 168 hours. ?Lipid Profile: ?No results for input(s): CHOL, HDL, LDLCALC, TRIG, CHOLHDL, LDLDIRECT in the last 72 hours. ?Thyroid Function Tests: ?No results for input(s): TSH, T4TOTAL, FREET4, T3FREE, THYROIDA

## 2021-06-17 NOTE — Progress Notes (Signed)
?   06/17/21 0800  ?Assess: MEWS Score  ?Temp 99.4 ?F (37.4 ?C)  ?BP 116/86  ?Pulse Rate 89  ?ECG Heart Rate 89  ?Resp (!) 30  ?Level of Consciousness Responds to Voice ?(eyes open non verbal)  ?SpO2 99 %  ?O2 Device Tracheostomy Collar  ?Patient Activity (if Appropriate) In bed  ?O2 Flow Rate (L/min) 5 L/min  ?FiO2 (%) 28 %  ?Assess: MEWS Score  ?MEWS Temp 0  ?MEWS Systolic 0  ?MEWS Pulse 0  ?MEWS RR 2  ?MEWS LOC 1  ?MEWS Score 3  ?MEWS Score Color Yellow  ?Assess: if the MEWS score is Yellow or Red  ?Were vital signs taken at a resting state? Yes  ?Focused Assessment No change from prior assessment  ?Early Detection of Sepsis Score *See Row Information* Low  ?MEWS guidelines implemented *See Row Information* Yes  ?Treat  ?MEWS Interventions Administered prn meds/treatments;Administered scheduled meds/treatments  ?Pain Scale 0-10  ?Multiple Pain Sites No  ?Breathing 0  ?Negative Vocalization 0  ?Facial Expression 0  ?Body Language 0  ?Consolability 0  ?PAINAD Score 0  ?Facial Expression 0  ?Body Movements 0  ?Muscle Tension 0  ?Compliance with ventilator (intubated pts.) N/A  ?Vocalization (extubated pts.) N/A  ?CPOT Total 0  ?Take Vital Signs  ?Increase Vital Sign Frequency   ?(Continue q 4 VS)  ?Notify: Charge Nurse/RN  ?Name of Charge Nurse/RN Notified Jasmina, RN  ?Date Charge Nurse/RN Notified 06/17/21  ?Time Charge Nurse/RN Notified 1020  ?Notify: Provider  ?Provider Name/Title Dr. Mahala Menghini  ?Date Provider Notified 06/17/21  ?Time Provider Notified 1030  ?Notification Type Page  ?Notification Reason Other (Comment) ?(Follow up with red/ yellow MEWS r/t respirations and LOC)  ?Provider response See new orders  ?Date of Provider Response 06/17/21  ?Time of Provider Response 1000  ?Document  ?Patient Outcome  ?(Patient Stable)  ? ? ?

## 2021-06-17 NOTE — Plan of Care (Signed)
?  Problem: Clinical Measurements: ?Goal: Will remain free from infection ?Outcome: Adequate for Discharge ?Goal: Diagnostic test results will improve ?Outcome: Adequate for Discharge ?Goal: Respiratory complications will improve ?Outcome: Adequate for Discharge ?  ?Problem: Nutrition: ?Goal: Adequate nutrition will be maintained ?Outcome: Adequate for Discharge ?  ?

## 2021-06-18 LAB — CULTURE, BLOOD (ROUTINE X 2): Culture: NO GROWTH

## 2021-06-18 NOTE — Progress Notes (Signed)
?PROGRESS NOTE ? ? ? ?Billy Cherry  [id number] DOB: Aug 10, 1970 DOA: 03/30/2021 ?PCP: Patient, No Pcp Per (Inactive)  ? ? ?Brief Narrative:  ?51 years old male with past medical history of hypertension was brought into the hospital after being found unresponsive by his wife.   ?1/19 admission, left heart cath without significant CAD.  Concern for anoxic brain injury ?1/21: MRI shows areas of hypoxic-ischemic injury bilaterally. Fevered; cultures obtained. ?1/23 severe brain damage ?1/25: PEG . Neuro exam unchanged.  Does exhibit new eyelid tremor like movements and lip/jaw tremor like vs clonus movement.  Bolused Keppra and increased maintenance dose.  Repeat EEG. CTH: concerning for progression of anoxic injury.  ?1/26: Repeat EEG unchanged and continues to show myoclonic seizures despite addition of Valproic acid yesterday. transfer to [name] from [place] for continuous EEG ?1/28: Continues to have myoclonic sz on EEG ?2/3 no meaningful neuro recovery. Diuresed ?2/6 New fever to 102, WBC 16K - treated with vancomycin for 7d based on cultures.  ?2/11: percutaneous tracheostomy ?2/13 >2/20 on trach collar. No vent.  ?2/20 Trach change to 6 cuffless ?2/28: fever ?3/13: eliquis started ?4/3: fever ?06/06/2021 CT of the chest with new PE and haziness consistent with pneumonia ?4/5-4/9: responding well to PRN lasix ?4/11 fever again after completion of abx- work up in progress ?4/13 continues to have low-grade temps cultures from 4/11 no growth to date-suspect silent/occult aspiration-heparin changed back to Eliquis 4/13 ?4/14: Work-up negative with no growth to date but red menance Tmax elevated 99.4 and respiratory rate up-x-ray did not show any pneumonia but probably silently aspirating ?  ? ? ?Assessment and Plan: ?* Cardiac arrest [facility]) ?Postarrest anoxic encephalopathy ?Postarrest respiratory failure s/p TC ?S/P PEG tube placement ?Postarrest myoclonus: ?2D echocardiogram showed reduced LV function at <20%.   Now status post trach and PEG.   ?Obtunded 2/2 anoxic encephalopathy- ?continue Keppra 750 twice daily, Depakote 750 3 times daily, as needed clonazepam 0.5 3 times daily or myoclonus and can increase Klonopin for breakthrough myoclonus.   ?PCCM managing tracheostomy. ?Disposition = with home health wife arranging care -- ?Meeting was slated for 4/15 to discuss overall care issues-she has not arrived and has not shown up for this meeting and patient continues to have low-grade fevers and tachypnea ?Overall prognosis remains abysmal ? ?Anoxic encephalopathy ([place]) ?Continue supportive care--- patient is non-responsive to meaningful commands ? ?Acute respiratory failure ([place]) ?Status post trach collar.  Continue supportive care.  CCM evaluating q. weekly ? ?Recurrent aspiration and fevers ?RLL pneumonia ?Previously had completed vancomycin for MSSA and [name] in early march.  Received more than 7-day course of IV Fortaz. ?On 4/3, CT chest was ordered that indicated an area of alveolar infiltration that may represent pneumonia.  Tracheal aspirate has been sent for culture and is significant for normal respiratory flora. Concerning for aspiration. ?-s/p Unasyn x 7 days ?-If another episode of fever >101 we will reconsult ID ?-low grade temps 4/15-Tmax 99.8 ? ?DVT (deep venous thrombosis) ([place]) ?Duplex ultrasound of lower extremity on 05/15/2021 showed age indeterminate deep vein thrombosis involving the left femoral vein, left proximal profunda vein, left posterior tibial veins, left popliteal vein, and left peroneal veins.  ?Heparin has been changed back to Eliquis 4/13 ?-repeated duplex 4/11 (due to fever) and clot has not propagated.   ? ?Dysphagia with exceedingly high risk of aspiration and silent aspiration ?Continue PEG tube feeding.  Tolerating but high risk of aspiration ?-Aspiration precautions ? ?Pulmonary embolus ([place]) ?some  evidence of possible pulmonary hypertension.  ?See above  discussion ? ?Pressure injury of skin ?Lateral penis, posterior right heel, right foot. Not present on admission--- these have been reviewed and are improved ?His right foot has some sloughing underneath the first metatarsophalangeal joint and I will ask wound care to have a look at him tomorrow-?  [place] ? ?HTN (hypertension) ?HFrEF (heart failure with reduced ejection fraction) ([place]) ?-Continue Coreg 25 twice daily, amlodipine (restarted 4/10) 2.5, Cardura 2 mg, hydralazine and PRN Labetalol,  isosorbide dinitrate 30 mg TID ?-Patient is slightly tachycardic today-if this recurs we may increase the Coreg slightly ?2D echocardiogram-LVEF < 20%,  ?-PRN lasix ? ?Myoclonus ?Seen by neurology.  Continue current AED with Klonopin, Keppra. Per neuro notes, suppressing his myoclonus will not ultimately change the prognosis  ? ?Hypernatremia-resolved as of 06/09/2021 ?Resolved with free water flushes ? ?Hyperkalemia ?-resolved ?-titrate up aldactone to prior dose when able ? ? ?apixaban (ELIQUIS) tablet 10 mg  ?apixaban (ELIQUIS) tablet 5 mg  ?  Code Status: Full Code-patient's prognosis is exceedingly poor as previously documented ?Family Communication: wife and I spoke on Jul 01, 2022 -- a relative died in the family currently 4/15-I have asked her to reach back out to [place] when she is able to meet face-to-face for a meeting regarding her husband and his prognosis ?Disposition Plan:  ?Level of care: Telemetry Medical ?Status is: Inpatient ?Remains inpatient appropriate because: needs safe d/c plan ?  ? ?Consultants:  ?PCCM ?Neurology ?Palliative care ?ID ?IR ?GI ?cards ? ? ?Subjective: ? ?Secretions improved--patient remains about the same ? ?Objective: ?Vitals:  ? 06/18/21 0849 06/18/21 1100 06/18/21 1134 06/18/21 1556  ?BP: 106/75 112/80    ?Pulse: 88 98 (!) 101 98  ?Resp: (!) 25 (!) 27 (!) 24 (!) 23  ?Temp:  98.8 ?F (37.1 ?C)    ?TempSrc:  Axillary    ?SpO2: 99% 96% 98% 100%  ?Weight:      ?Height:      ? ? ?Intake/Output Summary  (Last 24 hours) at 06/18/2021 1740 ?Last data filed at 06/18/2021 1736 ?Gross per 24 hour  ?Intake 1415 ml  ?Output 1150 ml  ?Net 265 ml  ? ? ?Filed Weights  ? June 30, 2021 0335 06/17/21 0009 06/18/21 0505  ?Weight: 94 kg 96 kg 96 kg  ? ? ?Examination: ? ? ?Eyes open--no meaningful movement ?Trach in place #6 [place] ?Chest is clear posterolaterally ?Decubitus ulcer on right lower extremity noted below first metatarsophalangeal joint-seems to have a little bit more slough ?Wound nurse has been consulted ? ?Data Reviewed: I have personally reviewed following labs and imaging studies ? ?CBC: ?Recent Labs  ?Lab 06/12/21 ?0232 06/13/21 ?0327 06/14/21 ?[id number]  ?WBC 14.6* 12.9* 10.6*  ?HGB 11.0* 9.9* 9.9*  ?HCT 33.7* 30.4* 30.6*  ?MCV 94.7 95.6 95.9  ?PLT 215 247 258  ? ? ?Basic Metabolic Panel: ?Recent Labs  ?Lab 06/12/21 ?0232 06/13/21 ?0327 06/14/21 ?[id number]  ?NA 134* 136 136  ?K 4.3 4.2 4.8  ?CL 95* 97* 97*  ?CO2 31 31 33*  ?GLUCOSE 102* 111* 83  ?BUN 25* 34* 33*  ?CREATININE 0.54* 0.63 0.53*  ?CALCIUM 8.7* 8.4* 8.5*  ? ? ?GFR: ?Estimated Creatinine Clearance: 128.4 mL/min (A) (by C-G formula based on SCr of 0.53 mg/dL (L)). ?Liver Function Tests: ?No results for input(s): AST, ALT, ALKPHOS, BILITOT, PROT, ALBUMIN in the last 168 hours. ? ?No results for input(s): LIPASE, AMYLASE in the last 168 hours. ?No results for input(s): AMMONIA in the last 168 hours. ?  Coagulation Profile: ?No results for input(s): INR, PROTIME in the last 168 hours. ?Cardiac Enzymes: ?No results for input(s): CKTOTAL, CKMB, CKMBINDEX, TROPONINI in the last 168 hours. ?BNP (last 3 results) ?No results for input(s): PROBNP in the last 8760 hours. ?HbA1C: ?No results for input(s): HGBA1C in the last 72 hours. ?CBG: ?No results for input(s): GLUCAP in the last 168 hours. ?Lipid Profile: ?No results for input(s): CHOL, HDL, LDLCALC, TRIG, CHOLHDL, LDLDIRECT in the last 72 hours. ?Thyroid Function Tests: ?No results for input(s): TSH, T4TOTAL, FREET4, T3FREE,  THYROIDAB in the last 72 hours. ?Anemia Panel: ?No results for input(s): VITAMINB12, FOLATE, FERRITIN, TIBC, IRON, RETICCTPCT in the last 72 hours. ?Sepsis Labs: ?No results for input(s): PROCALCITON, LATICAC

## 2021-06-19 DIAGNOSIS — I469 Cardiac arrest, cause unspecified: Secondary | ICD-10-CM | POA: Diagnosis not present

## 2021-06-19 LAB — COMPREHENSIVE METABOLIC PANEL
ALT: 12 U/L (ref 0–44)
AST: 18 U/L (ref 15–41)
Albumin: 2 g/dL — ABNORMAL LOW (ref 3.5–5.0)
Alkaline Phosphatase: 52 U/L (ref 38–126)
Anion gap: 6 (ref 5–15)
BUN: 38 mg/dL — ABNORMAL HIGH (ref 6–20)
CO2: 29 mmol/L (ref 22–32)
Calcium: 8.7 mg/dL — ABNORMAL LOW (ref 8.9–10.3)
Chloride: 103 mmol/L (ref 98–111)
Creatinine, Ser: 0.73 mg/dL (ref 0.61–1.24)
GFR, Estimated: >60 mL/min (ref >60)
Glucose, Bld: 127 mg/dL — ABNORMAL HIGH (ref 70–99)
Potassium: 4.2 mmol/L (ref 3.5–5.1)
Sodium: 138 mmol/L (ref 135–145)
Total Bilirubin: 0.5 mg/dL (ref 0.3–1.2)
Total Protein: 6.3 g/dL — ABNORMAL LOW (ref 6.5–8.1)

## 2021-06-19 LAB — CBC
HCT: 31.8 % — ABNORMAL LOW (ref 39.0–52.0)
Hemoglobin: 10 g/dL — ABNORMAL LOW (ref 13.0–17.0)
MCH: 31.1 pg (ref 26.0–34.0)
MCHC: 31.4 g/dL (ref 30.0–36.0)
MCV: 98.8 fL (ref 80.0–100.0)
Platelets: 352 10*3/uL (ref 150–400)
RBC: 3.22 MIL/uL — ABNORMAL LOW (ref 4.22–5.81)
RDW: 14.3 % (ref 11.5–15.5)
WBC: 10.8 10*3/uL — ABNORMAL HIGH (ref 4.0–10.5)
nRBC: 0 % (ref 0.0–0.2)

## 2021-06-19 LAB — MAGNESIUM: Magnesium: 2 mg/dL (ref 1.7–2.4)

## 2021-06-19 NOTE — Consult Note (Signed)
[facility] Nurse wound follow up ?Wound type:Pressure injury. Also, Dr. Verlon Au requests assessment for enzymatic debriding agent to right foot, 1st met head. Followed weekly by [facility]. Last seen by this writer on 05/08/21. ?Measurement: ?Right heel: 5cm x 5cm x0cm, intact, stable, blood-filled blister  ?Right foot, plantar surface at met heads 2-4, stable, 2cm x 4cm x 0.2cm.  Circular, red, full thickness wound in center, 0.4cm round x 0.2cm, no nonviable tissue. ?Wound bed: As described above ?Drainage (amount, consistency, odor) None ?Periwound: intact, dry ?Dressing procedure/placement/frequency: Current POC for xeroform (antimicrobial, nonadherent) to right foot, plantar aspect wound-even with new red full thickness wound in center-is appropriate. This wound contact layer is topped with silicone foam dressings, as is the heel DTPI. ? ?Patient's feet-even in Prevalon boots-are periodically near the footboard of the bed. Repositioned up in bed to ensure space between boots and footboard. Communicated to Nursing staff. ? ?[facility] nursing team will follow, and will remain available to this patient, the nursing and medical teams.  ?Thanks, ?Maudie Flakes, MSN, RN, [place], [place], CWON-AP, [place]  ?Pager# [phone]  ? ?  ?

## 2021-06-19 NOTE — Progress Notes (Addendum)
?PROGRESS NOTE ? ? ? ?Billy Cherry  [id number] DOB: 11/06/1970 DOA: 03/30/2021 ?PCP: Patient, No Pcp Per (Inactive)  ? ? ?Brief Narrative:  ?51 years old male with past medical history of hypertension was brought into the hospital after being found unresponsive by his wife.   ?1/19 admission, left heart cath without significant CAD.  Concern for anoxic brain injury ?1/21: MRI shows areas of hypoxic-ischemic injury bilaterally. Fevered; cultures obtained. ?1/23 severe brain damage ?1/25: PEG . Neuro exam unchanged.  Does exhibit new eyelid tremor like movements and lip/jaw tremor like vs clonus movement.  Bolused Keppra and increased maintenance dose.  Repeat EEG. CTH: concerning for progression of anoxic injury.  ?1/26: Repeat EEG unchanged and continues to show myoclonic seizures despite addition of Valproic acid yesterday. transfer to [name] from [place] for continuous EEG ?1/28: Continues to have myoclonic sz on EEG ?2/3 no meaningful neuro recovery. Diuresed ?2/6 New fever to 102, WBC 16K - treated with vancomycin for 7d based on cultures.  ?2/11: percutaneous tracheostomy ?2/13 >2/20 on trach collar. No vent.  ?2/20 Trach change to 6 cuffless ?2/28: fever ?3/13: eliquis started ?4/3: fever ?06/06/2021 CT of the chest with new PE and haziness consistent with pneumonia ?4/5-4/9: responding well to PRN lasix ?4/11 fever again after completion of abx- work up in progress ?4/13 continues to have low-grade temps cultures from 4/11 no growth to date-suspect silent/occult aspiration-heparin changed back to Eliquis 4/13 ?4/14: Work-up negative with no growth to date but red menance Tmax elevated 99.4 and respiratory rate up-x-ray did not show any pneumonia but probably silently aspirating ?  ? ? ?Assessment and Plan: ?* Cardiac arrest [facility]) ?Postarrest anoxic encephalopathy ?Postarrest respiratory failure s/p TC ?S/P PEG tube placement ?Postarrest myoclonus: ?2D echocardiogram showed reduced LV function at <20%.   Now status post trach and PEG.   ?Obtunded 2/2 anoxic encephalopathy- ?continue Keppra 750 twice daily, Depakote 750 3 times daily, as needed clonazepam 0.5 3 times daily or myoclonus and can increase Klonopin for breakthrough myoclonus.   ?PCCM managing tracheostomy. ?Disposition = with home health wife arranging care -- ?Meeting was slated for 4/15 to discuss overall care -she will contact us when she is available again--CM is reaching out to her as Home health can start 4/25 ? 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 ?Acute respiratory failure ([place]) ?Status post trach collar.  Continue supportive care.  CCM evaluating q. weekly ? ?Recurrent aspiration and fevers ?RLL pneumonia ?Previously had completed vancomycin for MSSA and [name] in early march.  Received more than 7-day course of IV Fortaz. ?On 4/3, CT chest was ordered that indicated an area of alveolar infiltration that may represent pneumonia.  Tracheal aspirate has been sent for culture and is significant for normal respiratory flora. Concerning for aspiration. ?-s/p Unasyn x 7 days ?-If another episode of fever >101 we will reconsult ID ?-continues with low grade temps ? ?DVT (deep venous thrombosis) ([place]) ?Duplex ultrasound of lower extremity on 05/15/2021 showed age indeterminate deep vein thrombosis involving the left femoral vein, left proximal profunda vein, left posterior tibial veins, left popliteal vein, and left peroneal veins.  ?Heparin has been changed back to Eliquis 4/13 ?-repeated duplex 4/11 (due to fever) and clot has not propagated.   ? ?Dysphagia with exceedingly high risk of aspiration and silent aspiration ?Continue PEG tube feeding.  Tolerating but high risk of aspiration ?-Aspiration precautions ? ?Pulmonary embolus ([place]) ?some evidence of possible pulmonary hypertension.  ?  See above discussion ? ?Pressure injury of skin ?Lateral penis, posterior  right heel, right foot. Not present on admission--- these have been reviewed and are improved ?His right foot has some sloughing underneath the first metatarsophalangeal joint ?Appreciate WOC nurse input ? ?HTN (hypertension) ?HFrEF (heart failure with reduced ejection fraction) ([place]) ?-Continue Coreg 25 twice daily, amlodipine (restarted 4/10) 2.5, Cardura 2 mg, hydralazine and PRN Labetalol,  isosorbide dinitrate 30 mg TID ?-Patient is slightly tachycardic today-if this recurs we may increase the Coreg slightly ?2D echocardiogram-LVEF < 20%,  ?-PRN lasix ? ?Myoclonus ?Seen by neurology.  Continue current AED with Klonopin, Keppra. Per neuro notes, suppressing his myoclonus will not ultimately change the prognosis  ? ?Hypernatremia-resolved as of 06/09/2021 ?Resolved with free water flushes ? ?Hyperkalemia ?-resolved ?-titrate up aldactone to prior dose when able ? ? ?apixaban (ELIQUIS) tablet 10 mg  ?apixaban (ELIQUIS) tablet 5 mg  ?  Code Status: Full Code-patient's prognosis is exceedingly poor as previously documented ?Family Communication: wife and I spoke on 07/05/2022 -- a relative died in the family currently 4/15-I have asked her to reach back out to [place] when she is able to meet face-to-face for a meeting regarding her husband and his prognosis ?Disposition Plan:  ?Level of care: Telemetry Medical ?Status is: Inpatient ?Remains inpatient appropriate because: needs safe d/c plan ? ?Consultants:  ?PCCM ?Neurology ?Palliative care ?ID ?IR ?GI ?Cards ? ? ?Subjective: ? ?Patient remains about the same--secretions are about the same ? ?Objective: ?Vitals:  ? 06/19/21 0900 06/19/21 1015 06/19/21 1116 06/19/21 1548  ?BP: 111/76 103/69  118/74  ?Pulse: 92 91 92 92  ?Resp: 11 18  20   ?Temp:  99.2 ?F (37.3 ?C)  98.9 ?F (37.2 ?C)  ?TempSrc:  Oral  Oral  ?SpO2: 94% 95%  100%  ?Weight:      ?Height:      ? ? ?Intake/Output Summary (Last 24 hours) at 06/19/2021 1634 ?Last data filed at 06/19/2021 1222 ?Gross per 24 hour  ?Intake  1415 ml  ?Output 1800 ml  ?Net -385 ml  ? ? ?Filed Weights  ? 06/17/21 0009 06/18/21 0505 06/19/21 0029  ?Weight: 96 kg 96 kg 94 kg  ? ? ?Examination: ? ? ?Eyes open--no meaningful movements ?Trach in place #6 Shiley ?Chest is clear posterolaterally ?Decubitus ulcer on right lower extremity examined by [place] RN ? ? ?Data Reviewed: I have personally reviewed following labs and imaging studies ? ?CBC: ?Recent Labs  ?Lab 06/13/21 ?0327 06/14/21 ?[id number] 06/19/21 ?0222  ?WBC 12.9* 10.6* 10.8*  ?HGB 9.9* 9.9* 10.0*  ?HCT 30.4* 30.6* 31.8*  ?MCV 95.6 95.9 98.8  ?PLT 247 258 352  ? ? ?Basic Metabolic Panel: ?Recent Labs  ?Lab 06/13/21 ?0327 06/14/21 ?[id number] 06/19/21 ?0222  ?NA 136 136 138  ?K 4.2 4.8 4.2  ?CL 97* 97* 103  ?CO2 31 33* 29  ?GLUCOSE 111* 83 127*  ?BUN 34* 33* 38*  ?CREATININE 0.63 0.53* 0.73  ?CALCIUM 8.4* 8.5* 8.7*  ?MG  --   --  2.0  ? ? ?GFR: ?Estimated Creatinine Clearance: 127.2 mL/min (by C-G formula based on SCr of 0.73 mg/dL). ?Liver Function Tests: ?Recent Labs  ?Lab 06/19/21 ?0222  ?AST 18  ?ALT 12  ?ALKPHOS 52  ?BILITOT 0.5  ?PROT 6.3*  ?ALBUMIN 2.0*  ? ? ?No results for input(s): LIPASE, AMYLASE in the last 168 hours. ?No results for input(s): AMMONIA in the last 168 hours. ?Coagulation Profile: ?No results for input(s): INR, PROTIME in the last 168  hours. ?Cardiac Enzymes: ?No results for input(s): CKTOTAL, CKMB, CKMBINDEX, TROPONINI in the last 168 hours. ?BNP (last 3 results) ?No results for input(s): PROBNP in the last 8760 hours. ?HbA1C: ?No results for input(s): HGBA1C in the last 72 hours. ?CBG: ?No results for input(s): GLUCAP in the last 168 hours. ?Lipid Profile: ?No results for input(s): CHOL, HDL, LDLCALC, TRIG, CHOLHDL, LDLDIRECT in the last 72 hours. ?Thyroid Function Tests: ?No results for input(s): TSH, T4TOTAL, FREET4, T3FREE, THYROIDAB in the last 72 hours. ?Anemia Panel: ?No results for input(s): VITAMINB12, FOLATE, FERRITIN, TIBC, IRON, RETICCTPCT in the last 72 hours. ?Sepsis  Labs: ?No results for input(s): PROCALCITON, LATICACIDVEN in the last 168 hours. ? ? ?Recent Results (from the past 240 hour(s))  ?Culture, blood (Routine X 2) w Reflex to ID Panel     Status: None  ? [place]

## 2021-06-19 NOTE — Progress Notes (Signed)
Nutrition Follow-up ? ?DOCUMENTATION CODES:  ? ?Not applicable ? ?INTERVENTION:  ? ?Tube Feeding via PEG:  ?1.5 cartons of Osmolite 1.5 - QID (335 mL each bolus) ?45 mL ProSource TF - BID ?300 mL free water q4h  ?Provides 2210 kcal, 111 gm PRO, and 1086 mL free water (2886 mL total free water) daily.  ? ?Continue Juven BID per tube, each packet provides 80 calories, 8 grams of carbohydrate, 2.5  grams of protein  to promote wound healing ? ?NUTRITION DIAGNOSIS:  ? ?Inadequate oral intake related to inability to eat as evidenced by NPO status. - Being addressed via TF  ? ?GOAL:  ? ?Patient will meet greater than or equal to 90% of their needs - Met via TF ? ?MONITOR:  ? ?Labs, Skin, I & O's, Weight trends ? ?REASON FOR ASSESSMENT:  ? ?Ventilator, Consult ?Enteral/tube feeding initiation and management ? ?ASSESSMENT:  ? ?51 year old male who originally presented to [facility] on 1/19 after out-of-hospital cardiac arrest due to suspected angioedema from ACE inhibitor leading to asphyxiation. PMH of HTN, HLD. ? ?2/11 - tracheostomy placed ?2/22 - PEG placed  ? ?RN reports no new signs of intolerance to tube feeds. ? ?Pt Medicaid was approved; current plan for Home Health to start on 06/27/21.  ? ?Ongoing wounds on R foot and heel, WOC team following.  ? ?Medications reviewed and include: Fiber BID, Reglan, Protonix, Spironolactone  ?Labs reviewed: BUN 38 ? ?Diet Order:   ?Diet Order   ? ? None  ? ?  ? ?EDUCATION NEEDS:  ? ?Not appropriate for education at this time ? ?Skin:  Skin Assessment: Skin Integrity Issues: ?Skin Integrity Issues:: Stage III, DTI ?DTI: R Foot & Heel ?Stage III: Penis ?Other: MASD Buttocks ? ?Last BM:  4/15 ? ?Height:  ? ?Ht Readings from Last 1 Encounters:  ?04/18/21 $RemoveBe'5\' 10"'zRWdeylif$  (1.778 m)  ? ? ?Weight:  ? ?Wt Readings from Last 1 Encounters:  ?06/20/21 95 kg  ? ? ?Ideal Body Weight:  75.5 kg ? ?BMI:  Body mass index is 30.05 kg/m?. ? ?Estimated Nutritional Needs:  ? ?Kcal:  2200-2400 ? ?Protein:  110-125  grams ? ?Fluid:  >/= 2.2 L ? ? ?Hermina Barters RD, LDN ?Clinical Dietitian ?See AMiON for contact information.  ? ? ?

## 2021-06-19 NOTE — Procedures (Signed)
Tracheostomy Change Note ? ?Patient Details:   ?Name: Billy Cherry ?DOB: 02-13-1971 ?MRN: [id number] ?   ?Airway Documentation: ?   ? ?Evaluation ? O2 sats: stable throughout ?Complications: No apparent complications ?Patient did tolerate procedure well. ?Bilateral Breath Sounds: Diminished ?  ? ?Pt's trach was changed per MD order. #6 shiley flex cfs was inserted without any complications. Color change was noted post insertion. No blood at stoma site. Pt has stable vitals at this time. RT will monitor.  ? ?Merlene Laughter ?06/19/2021, 2:41 PM ?

## 2021-06-19 NOTE — TOC Progression Note (Signed)
Transition of Care (TOC) - Progression Note  ? ? ?Patient Details  ?Name: Billy Cherry ?MRN: [id number] ?Date of Birth: 1970/07/19 ? ?Transition of Care (TOC) CM/SW Contact  ?Leone Haven, RN ?Phone Number: ?06/19/2021, 4:53 PM ? ?Clinical Narrative:    ?Medicaid has been approved.  Awaiting auth for skilled nursing thru Medicaid with [employer].  Per [name] with [facility] ,will need to change goal date to 4/25 for the skilled nursing to come out.   ? ? ?Expected Discharge Plan: Home w Home Health Services ?Barriers to Discharge: Barriers Resolved ? ?Expected Discharge Plan and Services ?Expected Discharge Plan: Home w Home Health Services ?In-house Referral: Hospice / Palliative Care ?Discharge Planning Services: CM Consult ?Post Acute Care Choice: Long Term Acute Care (LTAC) ?Living arrangements for the past 2 months: Single Family Home ?                ?DME Arranged: N/A ?DME Agency: [employer] ?  ?  ?  ?HH Arranged: Nurse's Aide ?HH Agency: [facility] ?  ?  ?Representative spoke with at [facility] Agency: [name] ? ? ?Social Determinants of Health (SDOH) Interventions ?  ? ?Readmission Risk Interventions ? ?  04/14/2021  ? 11:14 AM  ?Readmission Risk Prevention Plan  ?Transportation Screening Complete  ?HRI or Home Care Consult Complete  ?Social Work Consult for Recovery Care Planning/Counseling Complete  ?Palliative Care Screening Complete  ?Medication Review Oceanographer) Complete  ? ? ?

## 2021-06-19 NOTE — Plan of Care (Signed)
?  Problem: Clinical Measurements: ?Goal: Respiratory complications will improve ?Outcome: Progressing ?  ?Problem: Nutrition: ?Goal: Adequate nutrition will be maintained ?Outcome: Progressing ?  ?Problem: Elimination: ?Goal: Will not experience complications related to bowel motility ?Outcome: Progressing ?  ?Problem: Elimination: ?Goal: Will not experience complications related to urinary retention ?Outcome: Progressing ?  ?Problem: Pain Managment: ?Goal: General experience of comfort will improve ?Outcome: Progressing ?  ?Problem: Safety: ?Goal: Ability to remain free from injury will improve ?Outcome: Progressing ?  ?Problem: Skin Integrity: ?Goal: Risk for impaired skin integrity will decrease ?Outcome: Progressing ?  ?

## 2021-06-19 NOTE — Progress Notes (Signed)
Wife called and stated she wanted to review tube feeding and track care tomorrow evening around 630 once she is complete with work.  ? ?

## 2021-06-19 NOTE — Progress Notes (Signed)
?  NAME:  Billy Cherry, MRN:  [id number], DOB:  12/27/1970, LOS: 81 ?ADMISSION DATE:  03/30/2021, CONSULTATION DATE:  1/19 ?REFERRING MD:  [place], CHIEF COMPLAINT:  Found down, cardiac arrest  ? ?Brief Pt Description / Synopsis:  ?51 year old male with out-of-hospital V. fib cardiac arrest in setting of respiratory arrest due to suspected angioedema from ACE inhibitor leading to asphyxiation.  Concern on his 12 lead EKG for anterolateral ST elevation so he was taken to the cath lab where his study showed no significant coronary artery disease and an LVEF < 20%  Now with anoxic brain injury. ? ?Pertinent  Medical History  ?Hypertension ? ?Significant Hospital Events: ?Including procedures, antibiotic start and stop dates in addition to other pertinent events   ?1/19 admission, left heart cath without significant CAD.  Concern for anoxic brain injury ?1/20: MRI pending, Neuro following ?1/21: MRI shows areas of hypoxic-ischemic injury bilaterally. Fevered; cultures obtained. ?1/23 severe brain damage ?1/24 evidence of severe brain damage, remains critically ill ?1/25: Plan for PEG today. Neuro exam unchanged.  Does exhibit new eyelid tremor like movements and lip/jaw tremor like vs clonus movement.  Bolused Keppra and increased maintenance dose.  Repeat EEG. CTH: concerning for progression of anoxic injury.  ?1/26: Repeat EEG unchanged and continues to show myoclonic seizures despite addition of Valproic acid yesterday. transfer to [facility] for continuous EEG ?1/28: Continues to have myoclonic sz on EEG ?1/29 No acute issues overnight ?2/3 no meaningful neuro recovery. Diuresed ?2/5 unchanged ?2/6 New fever to 102, WBC 16K - treated with vancomycin for 7d based on cultures.  ?2/11: percutaneous tracheostomy ?2/13 >2/20 on trach collar. No vent.  ?2/20 Trach change to 6 cuffless ?2/27 status unchanged ?3/13 unchanged ?3/20 due for trach exchange given continue secretions (although improved) will stick to 6 cuffless  for exchange  ?06/05/2021 Continued thick  secretions, scant blood tinged 4/3/am ?06/06/2021 CT of the chest with new PE and haziness consistent with pneumonia ? ?Interim History / Subjective:  ?No events. ?Copious secretions. ?Remains poorly responsive. ? ?Objective   ?Blood pressure 106/84, pulse 91, temperature 99 ?F (37.2 ?C), temperature source Oral, resp. rate (!) 21, height 5\' 10"  (1.778 m), weight 94 kg, SpO2 100 %. ?   ?FiO2 (%):  [28 %] 28 %  ? ?Intake/Output Summary (Last 24 hours) at 06/19/2021 0942 ?Last data filed at 06/19/2021 0446 ?Gross per 24 hour  ?Intake 2725 ml  ?Output 1450 ml  ?Net 1275 ml  ? ? ? ?Filed Weights  ? 06/17/21 0009 06/18/21 0505 06/19/21 0029  ?Weight: 96 kg 96 kg 94 kg  ? ?Examination:  ?No distress ?Does not respond to voice or pain ?Thick mucoid secretions ?Ext developing muscle wasting ? ?CBC/BMP reviewed, stable ?CXR 4/15 looks okay ? ?Ancillary tests personally reviewed:    ? ?Assessment & Plan:  ?OOH Vfib arrest ?Post arrest anoxic brain injury, severe ?Post arrest myoclonus improved with klonopin, no AEDs recommended ?Post arrest respiratory failure with prolonged mechanical ventilation s/p tracheostomy ?Post arrest cardiomyopathy ?Post arrest AKI, improved/stable ?HTN- stable on current regimen ?HCAP w/ MSSA- ABX > 2/21 ?Likely GPC bacteremia ?Aspiration pneumonitis- completed course of unasyn 4/5-4/10 ?PE- on eliquis ? ?Plan:  ?- Continue eliquis indefinitely as high risk of recurrence ?- Okay to change out trach per usual protocol, discussed with RT ?- Grim prognosis ?- Will see weekly, next 4/24 ? ?Erskine Emery MD PCCM ?

## 2021-06-20 MED ORDER — SPIRONOLACTONE 12.5 MG HALF TABLET
12.5000 mg | ORAL_TABLET | Freq: Every day | ORAL | Status: DC
  Administered 2021-06-20 – 2021-06-23 (×4): 12.5 mg
  Filled 2021-06-20 (×4): qty 1

## 2021-06-20 NOTE — Plan of Care (Signed)
  Problem: Clinical Measurements: Goal: Will remain free from infection Outcome: Progressing Goal: Diagnostic test results will improve Outcome: Progressing Goal: Respiratory complications will improve Outcome: Progressing   Problem: Nutrition: Goal: Adequate nutrition will be maintained Outcome: Progressing   

## 2021-06-20 NOTE — Progress Notes (Addendum)
10:15am: ?CSW spoke with [name] again who states the agency can likely begin in home services on 06/27/2021. CSW sent updated clinicals for review. ? ?9:25am: ?CSW spoke with [name] of [place] who states the insurance authorization is still pending for services. Robert to speak with team and return call to CSW with updates. ? ?8am: ?Patient added to DTP list for discharge planning. ? ?CSW left voicemail for [employer] requesting a return call. ? ?Edwin Dada, MSW, LCSW ?Transitions of Care  Clinical Social Worker II ?[phone] ? ?

## 2021-06-20 NOTE — Progress Notes (Signed)
Patient's  significant other called. Stated she is on the way to the hospital but is stuck in traffic. Will like to do his feeding for this evening. She asked if patient had any visitors for today, she was informed no. She stated " his aunt was suppose to come today to get education on PEG and trach care".  ?

## 2021-06-20 NOTE — Progress Notes (Signed)
Was informed by outgoing RN to wait for Pt.s significant other to be at bedside for tube feeding and trac care education. At 10:30pm She was not available and I have not received any call about delays in getting to the hospital. Pt's feeding and med given at this time. ?

## 2021-06-20 NOTE — Progress Notes (Signed)
[facility] ?PROGRESS NOTE ? ?Billy Cherry [id number] DOB: 1970-07-03 DOA: 03/30/2021 ?PCP: Patient, No Pcp Per (Inactive) ? ?Status  ?Remains inpatient appropriate because:  ?Unsafe discharge plan.  Plan is for family to take the patient home.  They have been educated regarding trach care and the wife is coming in on 4/18 for tube feeding and any other care management skills she has not yet been instructed in. ? ?Barriers to discharge: ?Social: ?Patient now has disability and Medicaid it will provide 84 hours/week of nursing care in the home which equals 12 hours/day.  This care has been authorized to begin on 4/25.  Of note wife does work 10-hour shifts and is caring for her grandson but her preference is to not place the patient in a nursing home. ? ?Clinical: ?Patient currently has trach, receiving bolus tube feedings and medications through his feeding tube--family currently being trained in how to manage these treatments ? ?Level of care:  ?Telemetry Medical ? ? ?Code Status: Full ?Family Communication: No family spoken to on date patient picked up on 4/18 ?DVT prophylaxis: Eliquis ?COVID vaccination status: Vaccination status unknown ? ?HPI: ?51 years old male with past medical history of hypertension was brought into the hospital after being found unresponsive by his wife.   ?1/19 admission, left heart cath without significant CAD.  Concern for anoxic brain injury ?1/21: MRI shows areas of hypoxic-ischemic injury bilaterally. Fevered; cultures obtained. ?1/23 severe brain damage ?1/25: PEG . Neuro exam unchanged.  Does exhibit new eyelid tremor like movements and lip/jaw tremor like vs clonus movement.  Bolused Keppra and increased maintenance dose.  Repeat EEG. CTH: concerning for progression of anoxic injury.  ?1/26: Repeat EEG unchanged and continues to show myoclonic seizures despite addition of Valproic acid yesterday. transfer to [name] from [place] for continuous EEG ?1/28: Continues  to have myoclonic sz on EEG ?2/3 no meaningful neuro recovery. Diuresed ?2/6 New fever to 102, WBC 16K - treated with vancomycin for 7d based on cultures.  ?2/11: percutaneous tracheostomy ?2/13 >2/20 on trach collar. No vent.  ?2/20 Trach change to 6 cuffless ?2/28: fever ?3/13: eliquis started ?4/3: fever ?06/06/2021 CT of the chest with new PE and haziness consistent with pneumonia ?4/5-4/9: responding well to PRN lasix ?4/11 fever again after completion of abx- work up in progress ?4/13 continues to have low-grade temps cultures from 4/11 no growth to date-suspect silent/occult aspiration-heparin changed back to Eliquis 4/13 ?4/14: Work-up negative with no growth to date but Tmax elevated 99.4 and respiratory rate up-x-ray did not show any pneumonia but probably silently aspirating ? ?Subjective: ?Patient unresponsive ? ?Objective: ?Vitals:  ? 06/20/21 0449 06/20/21 0715  ?BP:  114/80  ?Pulse: 82 91  ?Resp: (!) 25 20  ?Temp:  99.2 ?F (37.3 ?C)  ?SpO2:  96%  ? ? ?Intake/Output Summary (Last 24 hours) at 06/20/2021 0737 ?Last data filed at 06/20/2021 0300 ?Gross per 24 hour  ?Intake 360 ml  ?Output 1200 ml  ?Net -840 ml  ? ?Filed Weights  ? 06/18/21 0505 06/19/21 0029 06/20/21 0054  ?Weight: 96 kg 94 kg 95 kg  ? ? ?Exam: ? ?Constitutional: NAD, calm, appears comfortable and is otherwise unresponsive ?Eyes: PERRL, lids and conjunctivae normal ?Neck: normal, mildly stiff but able to easily reposition head and turn head ?Respiratory: 6.0 Shiley uncuffed trach, trach collar with 28% FiO2, bilateral lung sounds are coarse to auscultation with normal respiratory effort. ?Cardiovascular: S1-S2, normotensive, pulse regular.  No peripheral edema ?Abdomen: PEG tube in  place.  Abdomen is nondistended.  Bowel sounds positive.  ?Neurologic: CN 2-12 appear to be grossly intact based on limited exam and visual inspection.  Blinks regularly so unable to truly determine if has a blink reflex.  Room randomly but will not respond to  verbal commands to turn head and look into and does not make any attempts to follow you in the room or track.  Extremities are flaccid and no response to pain.  Unresponsive ?Psychiatric: Unresponsive ? ? ?Assessment/Plan: ?* Cardiac arrest ([place]) ?Postarrest anoxic encephalopathy ?Postarrest respiratory failure s/p chronic trach ?S/P PEG tube placement ?Postarrest myoclonus: ?2D echocardiogram showed reduced LV function at <20%.  Now status post trach and PEG.   ?Obtunded 2/2 anoxic encephalopathy ?continue Keppra 750 twice daily, Depakote 750 3 times daily, prn clonazepam 0.5 3 times daily or myoclonus and can increase Klonopin for breakthrough myoclonus.   ?PCCM managing tracheostomy not a candidate for decannulation given mentation high risk for aspiration ?Overall prognosis poor-full for recovery and wish patient to remain full code and for aggressive care ? ?Recurrent aspiration and fevers ?RLL pneumonia ?During this hospitalization positive for MSSA and [name].--status post vancomycin and Fortaz  ?On 4/3, CT chest was ordered that indicated an area of alveolar infiltration that may represent pneumonia.  Aspirate negative and concerns for for recurrent aspiration--treated with Unasyn for 7 days ?-low grade temps 4/15-Tmax 99.8-UA negative-Pete blood cultures no growth to date-9 [place] ?  ?DVT (deep venous thrombosis)/pulmonary embolism ?Duplex ultrasound of lower extremity on 05/15/2021 showed age indeterminate deep vein thrombosis involving the left femoral vein, left proximal profunda vein, left posterior tibial veins, left popliteal vein, and left peroneal veins.  ?Heparin has been changed back to Eliquis 4/13 ?-repeated duplex 4/11 (due to fever) and clot has not propagated.   ?-PE with possible pulmonary hypertension but has remained hemodynamically stable with minimal O2 requirement ?  ?Dysphagia/protein calorie malnutrition ?-exceedingly high risk of aspiration and silent aspiration due  to persistent altered mentation ?Continue bolus feeds ?-Aspiration precautions-bed 30 degrees at all times ?Nutrition Problem: Inadequate oral intake ?Etiology: inability to eat ?Signs/Symptoms: NPO status ?Interventions: Tube feeding, Prostat, Juven ?  ?Pressure injury of skin ?Lateral penis wound has healed, DTP posterior right heel with fluid intact blister 6 x 5 cm and dark purple in color, right foot dry eschar which was easily removable measuring 1 x 2 x 2 cm with pink moist stage II pressure injury based. Not present on admission ?Wound care to right heel and right foot: Cover with silicone foam in place foot in Prevalon boots.  Change foam dressing every 3 days or as needed soiling ? ?HTN (hypertension) ?HFrEF (heart failure with reduced ejection fraction) ([place]) ?-Continue Coreg 25 twice daily, amlodipine (restarted 4/10) 2.5, Cardura 2 mg, hydralazine and PRN Labetalol,  isosorbide dinitrate 30 mg TID ?-Patient is slightly tachycardic today-if this recurs we may increase the Coreg slightly ?2D echocardiogram-LVEF < 20%,  ?-PRN lasix ?-Low-dose Aldactone and watch for hyperkalemia ?  ?Myoclonus ?Seen by neurology.  Continue current AED with Klonopin, Keppra. Per neuro notes, suppressing his myoclonus will not ultimately change the prognosis  ?On 4/18 no evidence of myoclonus or spasticity on exam ?  ?Hypernatremia-resolved as of 06/09/2021 ?Resolved with free water flushes ?  ?Hyperkalemia ?-resolved with potassium 4.2 on 4/17 ?-titrate up aldactone to prior dose when able ?  ? ? ? ?Data Reviewed: ?Basic Metabolic Panel: ?Recent Labs  ?Lab 06/14/21 ?[id number] 06/19/21 ?0222  ?NA 136 138  ?  K 4.8 4.2  ?CL 97* 103  ?CO2 33* 29  ?GLUCOSE 83 127*  ?BUN 33* 38*  ?CREATININE 0.53* 0.73  ?CALCIUM 8.5* 8.7*  ?MG  --  2.0  ? ?Liver Function Tests: ?Recent Labs  ?Lab 06/19/21 ?0222  ?AST 18  ?ALT 12  ?ALKPHOS 52  ?BILITOT 0.5  ?PROT 6.3*  ?ALBUMIN 2.0*  ? ?No results for input(s): LIPASE, AMYLASE in the last 168 hours. ?No  results for input(s): AMMONIA in the last 168 hours. ?CBC: ?Recent Labs  ?Lab 06/14/21 ?[id number] 06/19/21 ?0222  ?WBC 10.6* 10.8*  ?HGB 9.9* 10.0*  ?HCT 30.6* 31.8*  ?MCV 95.9 98.8  ?PLT 258 352  ? ?Cardiac En

## 2021-06-20 NOTE — Progress Notes (Signed)
?   06/20/21 1200  ?Assess: MEWS Score  ?BP (!) 140/113  ?Pulse Rate (!) 101  ?ECG Heart Rate (!) 101  ?Resp (!) 35  ?Level of Consciousness Alert ?(spontaneous eye movement)  ?SpO2 97 %  ?Assess: MEWS Score  ?MEWS Temp 0  ?MEWS Systolic 0  ?MEWS Pulse 1  ?MEWS RR 2  ?MEWS LOC 0  ?MEWS Score 3  ?MEWS Score Color Yellow  ?Assess: if the MEWS score is Yellow or Red  ?Were vital signs taken at a resting state? Yes  ?Focused Assessment Change from prior assessment (see assessment flowsheet)  ?Early Detection of Sepsis Score *See Row Information* Low  ?MEWS guidelines implemented *See Row Information* No, previously yellow, continue vital signs every 4 hours  ?Treat  ?MEWS Interventions Administered scheduled meds/treatments ?(BP given)  ?Take Vital Signs  ?Increase Vital Sign Frequency  Yellow: Q 2hr X 2 then Q 4hr X 2, if remains yellow, continue Q 4hrs  ?Escalate  ?MEWS: Escalate Yellow: discuss with charge nurse/RN and consider discussing with provider and RRT  ?Notify: Charge Nurse/RN  ?Name of Charge Nurse/RN Notified Melva, RN  ?Date Charge Nurse/RN Notified 06/20/21  ?Time Charge Nurse/RN Notified 1200  ?Document  ?Progress note created (see row info) Yes  ? ? ?

## 2021-06-21 NOTE — TOC Progression Note (Addendum)
Transition of Care (TOC) - Progression Note  ? ? ?Patient Details  ?Name: Billy Cherry ?MRN: [id number] ?Date of Birth: 1970-11-16 ? ?Transition of Care (TOC) CM/SW Contact  ?Zenon Mayo, RN ?Phone Number: ?06/21/2021, 12:48 PM ? ?Clinical Narrative:    ?DTP turned patient back over to Unit to follow.  NCM received call from Herbie [place] with [facility] , he states the goal for the Start of care is now 4/26 instead of 4/25, the Nurse will be available on 4/26.  [place] awaiting auth.  He states he has spoken with wife and he will let her know that the date is now 4/26.  [facility] to deliver hospital bed, they will need to coordinate with wife.  Also per Thedore Mins with [employer], patient will need to go home with 3 days worth of bolus feeds, suction , and 3 days worth of trach kit supplys, ambu bag and smaller innercannula trach size of 5, since he has a size 6.  Will have the secretary to go ahead and order the bolus feeds and trach kits.  ? ?1540- NCM received call from [place] with [facility] stating he has received a denial from [place] and they are requesting 2 months worth of paystubs from the wife.  [name] with [facility] states the wife told him that she sent this information to [place] the Development worker, community and that he can get that information from her.  [name] called [place] left message for her to fax this information to him so that Medicaid can review.  ? ?4/20- [name] states he got what he needed from [place] , the Development worker, community , ( wife had given her the documents) , employment verification.  ? ?4/20 1501- NCM called left wife message to inform her of what [name] informed me of about the Medicaid and that looking at goal date of 4/26 for soc for nursing if Medicaid gets approved.   ? ? ?Expected Discharge Plan: [place] ?Barriers to Discharge: Barriers Resolved ? ?Expected Discharge Plan and Services ?Expected Discharge Plan: [place] ?In-house Referral: Hospice / Palliative Care ?Discharge  Planning Services: CM Consult ?Post Acute Care Choice: Long Term Acute Care (LTAC) ?Living arrangements for the past 2 months: [place] ?                ?DME Arranged: N/A ?DME Agency: [facility] ?  ?  ?  ?HH Arranged: Nurse's Aide ?[place] Agency: [place] ?  ?  ?Representative spoke with at [place]: [name] ? ? ?Social Determinants of Health (SDOH) Interventions ?  ? ?Readmission Risk Interventions ? ?  04/14/2021  ? 11:14 AM  ?Readmission Risk Prevention Plan  ?Transportation Screening Complete  ?[place] or Home Care Consult Complete  ?Social Work Consult for [place] Planning/Counseling Complete  ?Palliative Care Screening Complete  ?Medication Review Press photographer) Complete  ? ? ?

## 2021-06-21 NOTE — Progress Notes (Signed)
Changed patient's H2O bottle out. ?

## 2021-06-21 NOTE — Progress Notes (Signed)
?   06/21/21 1600  ?Assess: MEWS Score  ?Temp 98.9 ?F (37.2 ?C)  ?BP (!) 150/105  ?Pulse Rate 96  ?ECG Heart Rate 96  ?Resp (!) 29  ?SpO2 94 %  ?Assess: MEWS Score  ?MEWS Temp 0  ?MEWS Systolic 0  ?MEWS Pulse 0  ?MEWS RR 2  ?MEWS LOC 0  ?MEWS Score 2  ?MEWS Score Color Yellow  ?Assess: if the MEWS score is Yellow or Red  ?Were vital signs taken at a resting state? Yes  ?Focused Assessment No change from prior assessment  ?Early Detection of Sepsis Score *See Row Information* Low  ?MEWS guidelines implemented *See Row Information* No, previously yellow, continue vital signs every 4 hours  ?Treat  ?MEWS Interventions Administered scheduled meds/treatments ?(scheduled BP medications)  ?Take Vital Signs  ?Increase Vital Sign Frequency  Yellow: Q 2hr X 2 then Q 4hr X 2, if remains yellow, continue Q 4hrs  ?Escalate  ?MEWS: Escalate Yellow: discuss with charge nurse/RN and consider discussing with provider and RRT  ?Notify: Charge Nurse/RN  ?Name of Charge Nurse/RN Notified Zebedee Iba, RN  ?Date Charge Nurse/RN Notified 06/21/21  ?Time Charge Nurse/RN Notified 1600  ?Document  ?Progress note created (see row info) Yes  ? ? ?

## 2021-06-21 NOTE — Progress Notes (Signed)
?PROGRESS NOTE ? ? ? ?Billy Cherry  WFU:[id number] DOB: 1970/05/10 DOA: 03/30/2021 ?PCP: Patient, No Pcp Per (Inactive)  ? ?Brief Narrative:  ?51 years old male with past medical history of hypertension was brought into the hospital after being found unresponsive by his wife.  Out of hospital V-fib cardiac arrest, received CPR, 1 shock with defibrillator, 2 doses of epinephrine with ROSC, EKG noted anterolateral STEMI ?1/19 admission, left heart cath without significant CAD.  Concern for anoxic brain injury ?1/21: MRI shows areas of hypoxic-ischemic injury bilaterally.  ?1/23 severe brain damage ?1/25: PEG . Neuro exam unchanged.  Does exhibit new eyelid tremor like movements and lip/jaw tremor like vs clonus movement.  Bolused Keppra and increased maintenance dose.  Repeat EEG. CTH: concerning for progression of anoxic injury.  ?1/26: Repeat EEG unchanged and continues to show myoclonic seizures despite addition of Valproic acid. transfer to [name] from [place] for continuous EEG ?1/28: Continues to have myoclonic sz on EEG ?2/3 no meaningful neuro recovery. Diuresed ?2/6 New fever to 102, WBC 16K - treated with vancomycin for 7d based on cultures.  ?2/11: percutaneous tracheostomy ?2/13 >2/20 on trach collar. No vent.  ?2/20 Trach change to 6 cuffless ?2/28: fever ?3/13: eliquis started ?4/3: fever ?06/06/2021 CT of the chest with new PE and haziness consistent with pneumonia ?4/5-4/9: responding well to PRN lasix ?4/11 fever again after completion of abx- work up in progress ?4/13 continues to have low-grade temps cultures from 4/11 no growth to date-suspect silent/occult aspiration-heparin changed back to Eliquis 4/13 ?4/14: Work-up negative with no growth to date but red menance Tmax elevated 99.4 and respiratory rate up-x-ray did not show any pneumonia but probably silently aspirating ?  ?Assessment and Plan: ? ?* Cardiac arrest [facility]) ?Postarrest anoxic encephalopathy ?Postarrest respiratory failure s/p  tracheostomy ?S/P PEG tube placement ?Postarrest myoclonus: ?2D echocardiogram showed reduced LV function at <20%.  Now status post trach and PEG.   ?Remains obtunded with anoxic encephalopathy-on Keppra, for myoclonus and can increase Klonopin for breakthrough myoclonus.   ?At this time, plan is home with home health ?Patient's wife is trying to arrange adequate care at home ?He will need around-the-clock care.  ?Enteral tube feeding trach kit and supplies to be arranged. PCCM managing tracheostomy. ?-Awaiting family education for trach management and feeding/PEG tube ? ?Anoxic encephalopathy ([place]) ?Continue supportive care.   ?Patient currently keeps his eyes open, but does not answer questions or follow commands ? ?Acute respiratory failure ([place]) ?Status post trach collar.  Continue supportive care ? ?RLL pneumonia ?Previously had completed vancomycin for MSSA and [name] in early march.  Received more than 7-day course of IV Fortaz. ?On 4/3, CT chest was ordered that indicated an area of alveolar infiltration that may represent pneumonia.  Tracheal aspirate has been sent for culture and is significant for normal respiratory flora. Concerning for aspiration. ?-s/p Unasyn x 7 days ? ?DVT (deep venous thrombosis) ([place]) ?Duplex ultrasound of lower extremity on 05/15/2021 showed age indeterminate deep vein thrombosis involving the left femoral vein, left proximal profunda vein, left posterior tibial veins, left popliteal vein, and left peroneal veins.  ?-Continue Eliquis ?-repeated duplex (due to fever) and clot has not propagated.   ? ?Dysphagia ?Continue PEG tube feeding.  Tolerating but high risk of aspiration ?-Aspiration precautions ? ?Recurrent fever ?-Could be tracheobronchitis, silent aspiration vs central fever ?-Temp of 100.4 early this morning ?-Will reculture if this recurs ?-Continue pulmonary toilet, WBC count is stable ? ?Pulmonary embolus ([place]) ?  Noted to have possible acute on  chronic pulmonary embolism on CT.  Also have some evidence of possible pulmonary hypertension.  Patient was previously managed on Eliquis which is transitioned to Lovenox ?-Continue Lovenox for now ? ?Pressure injury of skin ?Lateral penis, posterior right heel, right foot. Not present on admission. ? ?HTN (hypertension) ?Blood pressure elevated today. ?-Continue Coreg, amlodipine (restarted 4/10), Cardura, hydralazine and PRN Labetalol,  isosorbide dinitrate 30 mg TID ? ?HFrEF (heart failure with reduced ejection fraction) ([place]) ?2D echocardiogram-LVEF < 20%,  ?-Continue coreg, imdur, spironolactone ?-PRN lasix ? ?Myoclonus ?Seen by neurology.  Continue current AED with Klonopin, Keppra. Per neuro notes, suppressing his myoclonus will not ultimately change the prognosis  ? ?Hypernatremia-resolved as of 06/09/2021 ?Resolved with free water flushes ? ?Hyperkalemia ?-resolved ?-titrate up aldactone ? ?Overall poor prognosis ?Goals of care discussions ?-Remains unresponsive with trach, PEG, pressure wounds now ?-Seen by palliative care multiple times this admission, patient's wife continues to want full code and full scope of treatment ? ? ? ?apixaban (ELIQUIS) tablet 10 mg  ?apixaban (ELIQUIS) tablet 5 mg  ?  Code Status: Full Code ?Family Communication: No family at bedside, will attempt to contact wife today ? ?Disposition Plan:  ?Level of care: Telemetry Medical ?Status is: Inpatient ?Remains inpatient appropriate because: needs safe d/c plan ?  ? ?Consultants:  ?PCCM ?Neurology ?Palliative care ?ID ?IR ?GI ?cards ? ? ?Subjective: ?Opens eyes, otherwise unresponsive ? ?Objective: ?Vitals:  ? 06/21/21 0453 06/21/21 0524 06/21/21 3244 06/21/21 0719  ?BP:    (!) 139/102  ?Pulse:  93 87 94  ?Resp:  (!) 22 (!) 22 (!) 24  ?Temp:   100.3 ?F (37.9 ?C) 98.8 ?F (37.1 ?C)  ?TempSrc:    Oral  ?SpO2:  98% 97% 100%  ?Weight: 95 kg     ?Height:      ? ? ?Intake/Output Summary (Last 24 hours) at 06/21/2021 1047 ?Last data filed at  06/21/2021 0600 ?Gross per 24 hour  ?Intake 4500 ml  ?Output 1100 ml  ?Net 3400 ml  ? ?Filed Weights  ? 06/19/21 0029 06/20/21 0054 06/21/21 0453  ?Weight: 94 kg 95 kg 95 kg  ? ? ?Examination: ? ?Obese chronically ill male laying in bed, opens eyes, does not follow commands ?HEENT: Tracheostomy with trach collar ?CVS: S1-S2, regular rhythm, tachycardic ?Lungs: Scattered upper airway sounds ?Abdomen: Soft, nontender, PEG tube noted ?Extremities: No edema ?  ? ? ?Data Reviewed: I have personally reviewed following labs and imaging studies ? ?CBC: ?Recent Labs  ?Lab 06/19/21 ?0222  ?WBC 10.8*  ?HGB 10.0*  ?HCT 31.8*  ?MCV 98.8  ?PLT 352  ? ?Basic Metabolic Panel: ?Recent Labs  ?Lab 06/19/21 ?0222  ?NA 138  ?K 4.2  ?CL 103  ?CO2 29  ?GLUCOSE 127*  ?BUN 38*  ?CREATININE 0.73  ?CALCIUM 8.7*  ?MG 2.0  ? ?GFR: ?Estimated Creatinine Clearance: 127.8 mL/min (by C-G formula based on SCr of 0.73 mg/dL). ?Liver Function Tests: ?Recent Labs  ?Lab 06/19/21 ?0222  ?AST 18  ?ALT 12  ?ALKPHOS 52  ?BILITOT 0.5  ?PROT 6.3*  ?ALBUMIN 2.0*  ? ? ?No results for input(s): LIPASE, AMYLASE in the last 168 hours. ?No results for input(s): AMMONIA in the last 168 hours. ?Coagulation Profile: ?No results for input(s): INR, PROTIME in the last 168 hours. ?Cardiac Enzymes: ?No results for input(s): CKTOTAL, CKMB, CKMBINDEX, TROPONINI in the last 168 hours. ?BNP (last 3 results) ?No results for input(s): PROBNP in the last 8760 hours. ?  HbA1C: ?No results for input(s): HGBA1C in the last 72 hours. ?CBG: ?No results for input(s): GLUCAP in the last 168 hours. ?Lipid Profile: ?No results for input(s): CHOL, HDL, LDLCALC, TRIG, CHOLHDL, LDLDIRECT in the last 72 hours. ?Thyroid Function Tests: ?No results for input(s): TSH, T4TOTAL, FREET4, T3FREE, THYROIDAB in the last 72 hours. ?Anemia Panel: ?No results for input(s): VITAMINB12, FOLATE, FERRITIN, TIBC, IRON, RETICCTPCT in the last 72 hours. ?Sepsis Labs: ?No results for input(s): PROCALCITON,  LATICACIDVEN in the last 168 hours. ? ? ?Recent Results (from the past 240 hour(s))  ?Culture, blood (Routine X 2) w Reflex to ID Panel     Status: None  ? Collection Time: 06/13/21  8:58 AM  ? Specimen: BLOOD RIGHT H

## 2021-06-22 LAB — COMPREHENSIVE METABOLIC PANEL
ALT: 12 U/L (ref 0–44)
AST: 16 U/L (ref 15–41)
Albumin: 2.1 g/dL — ABNORMAL LOW (ref 3.5–5.0)
Alkaline Phosphatase: 60 U/L (ref 38–126)
Anion gap: 8 (ref 5–15)
BUN: 31 mg/dL — ABNORMAL HIGH (ref 6–20)
CO2: 27 mmol/L (ref 22–32)
Calcium: 8.7 mg/dL — ABNORMAL LOW (ref 8.9–10.3)
Chloride: 97 mmol/L — ABNORMAL LOW (ref 98–111)
Creatinine, Ser: 0.68 mg/dL (ref 0.61–1.24)
GFR, Estimated: >60 mL/min (ref >60)
Glucose, Bld: 107 mg/dL — ABNORMAL HIGH (ref 70–99)
Potassium: 4.4 mmol/L (ref 3.5–5.1)
Sodium: 132 mmol/L — ABNORMAL LOW (ref 135–145)
Total Bilirubin: 0.4 mg/dL (ref 0.3–1.2)
Total Protein: 6.4 g/dL — ABNORMAL LOW (ref 6.5–8.1)

## 2021-06-22 LAB — CBC WITH DIFFERENTIAL/PLATELET
Abs Immature Granulocytes: 0.11 10*3/uL — ABNORMAL HIGH (ref 0.00–0.07)
Basophils Absolute: 0 10*3/uL (ref 0.0–0.1)
Basophils Relative: 0 %
Eosinophils Absolute: 0.2 10*3/uL (ref 0.0–0.5)
Eosinophils Relative: 2 %
HCT: 33.1 % — ABNORMAL LOW (ref 39.0–52.0)
Hemoglobin: 10.9 g/dL — ABNORMAL LOW (ref 13.0–17.0)
Immature Granulocytes: 1 %
Lymphocytes Relative: 16 %
Lymphs Abs: 1.7 10*3/uL (ref 0.7–4.0)
MCH: 31.7 pg (ref 26.0–34.0)
MCHC: 32.9 g/dL (ref 30.0–36.0)
MCV: 96.2 fL (ref 80.0–100.0)
Monocytes Absolute: 1.3 10*3/uL — ABNORMAL HIGH (ref 0.1–1.0)
Monocytes Relative: 12 %
Neutro Abs: 7.1 10*3/uL (ref 1.7–7.7)
Neutrophils Relative %: 69 %
Platelets: 328 10*3/uL (ref 150–400)
RBC: 3.44 MIL/uL — ABNORMAL LOW (ref 4.22–5.81)
RDW: 14.1 % (ref 11.5–15.5)
WBC: 10.3 10*3/uL (ref 4.0–10.5)
nRBC: 0 % (ref 0.0–0.2)

## 2021-06-22 LAB — CULTURE, BLOOD (SINGLE)
Culture: NO GROWTH
Special Requests: ADEQUATE

## 2021-06-22 MED ORDER — ONDANSETRON HCL 4 MG/2ML IJ SOLN
4.0000 mg | Freq: Four times a day (QID) | INTRAMUSCULAR | Status: DC | PRN
  Administered 2021-06-22 – 2021-07-26 (×4): 4 mg via INTRAVENOUS
  Filled 2021-06-22 (×4): qty 2

## 2021-06-22 NOTE — Progress Notes (Signed)
Changed patients trach collar and dressing. ?

## 2021-06-22 NOTE — Progress Notes (Signed)
?PROGRESS NOTE ? ? ? ?Laster Appling  JFT:[id number] DOB: [birth year] DOA: 03/30/2021 ?PCP: Patient, No Pcp Per (Inactive)  ? ?Brief Narrative:  ?51 years old male with past medical history of hypertension was brought into the hospital after being found unresponsive by his wife.  Out of hospital V-fib cardiac arrest, received CPR, 1 shock with defibrillator, 2 doses of epinephrine with ROSC, EKG noted anterolateral STEMI ?1/19 admission, left heart cath without significant CAD.  Concern for anoxic brain injury ?1/21: MRI shows areas of hypoxic-ischemic injury bilaterally.  ?1/23 severe brain damage ?1/25: PEG . Neuro exam unchanged.  Does exhibit new eyelid tremor like movements and lip/jaw tremor like vs clonus movement.  Bolused Keppra and increased maintenance dose.  Repeat EEG. CTH: concerning for progression of anoxic injury.  ?1/26: Repeat EEG unchanged and continues to show myoclonic seizures despite addition of Valproic acid. transfer to [name] from [place] for continuous EEG ?1/28: Continues to have myoclonic sz on EEG ?2/3 no meaningful neuro recovery. Diuresed ?2/6 New fever to 102, WBC 16K - treated with vancomycin for 7d based on cultures.  ?2/11: percutaneous tracheostomy ?2/13 >2/20 on trach collar. No vent.  ?2/20 Trach change to 6 cuffless ?2/28: fever ?3/13: eliquis started ?4/3: fever ?06/06/2021 CT of the chest with new PE and haziness consistent with pneumonia ?4/5-4/9: responding well to PRN lasix ?4/11 fever again after completion of abx- work up in progress ?4/13 continues to have low-grade temps cultures from 4/11 no growth to date-suspect silent/occult aspiration-heparin changed back to Eliquis 4/13 ?4/14: Work-up negative with no growth to date but red menance Tmax elevated 99.4 and respiratory rate up-x-ray did not show any pneumonia but probably silently aspirating ?Now awaiting Medicaid before home nursing care can be set up for discharge ?  ?Assessment and Plan: ? ?Cardiac arrest  [facility]) ?Postarrest anoxic encephalopathy ?Postarrest respiratory failure s/p tracheostomy ?S/P PEG tube placement ?Postarrest myoclonus: ?2D echocardiogram showed reduced LV function at <20%.  Now status post trach and PEG.   ?Remains obtunded with anoxic encephalopathy-on Keppra, for myoclonus and can increase Klonopin for breakthrough myoclonus.   ?At this time, plan is home with home health ?Patient's wife is trying to arrange adequate care at home ?He will need around-the-clock care.  ?Enteral tube feeding trach kit and supplies to be arranged. PCCM managing tracheostomy. ?-Per RN and case manager, wife and aunt have yet to complete trach and PEG tube education ?-He also needs Medicaid for home nursing care which is pending, disposition depends on all of this ? 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   ?Patient currently keeps his eyes open, but does not answer questions or follow commands ? ?Acute respiratory failure ([place]) ?Status post trach collar.  Continue supportive care ? ?RLL pneumonia ?Previously had completed vancomycin for MSSA and [name] in early march.  Received more than 7-day course of IV Fortaz. ?On 4/3, CT chest was ordered that indicated an area of alveolar infiltration that may represent pneumonia.  Tracheal aspirate has been sent for culture and is significant for normal respiratory flora. Concerning for aspiration. ?-s/p Unasyn x 7 days ? ?DVT (deep venous thrombosis) ([place]) ?Duplex ultrasound of lower extremity on 05/15/2021 showed age indeterminate deep vein thrombosis involving the left femoral vein, left proximal profunda vein, left posterior tibial veins, left popliteal vein, and left peroneal veins.  ?-Continue Eliquis ?-repeated duplex (due to fever) and clot has not propagated.   ? ?Dysphagia ?Continue PEG tube feeding.  Tolerating but high risk  of aspiration ?-Aspiration precautions ? ?Recurrent fever ?-Could be tracheobronchitis, silent aspiration vs  central fever ?-Temp of 100.4 yesterday, now 99.9 ?-Will reculture if fever recurs ?-Continue pulmonary toilet, WBC count is stable ? ?Pulmonary embolus ([place]) ?Noted to have possible acute on chronic pulmonary embolism on CT.  Also have some evidence of possible pulmonary hypertension.  Patient was previously managed on Eliquis which is transitioned to Lovenox ?-Continue Lovenox for now ? ?Pressure injury of skin ?Lateral penis, posterior right heel, right foot. Not present on admission. ? ?HTN (hypertension) ?Blood pressure elevated today. ?-Continue Coreg, amlodipine (restarted 4/10), Cardura, hydralazine and PRN Labetalol,  isosorbide dinitrate 30 mg TID ? ?HFrEF (heart failure with reduced ejection fraction) ([place]) ?2D echocardiogram-LVEF < 20%,  ?-Continue coreg, imdur, spironolactone ?-PRN lasix ? ?Myoclonus ?Seen by neurology.  Continue current AED with Klonopin, Keppra. Per neuro notes, suppressing his myoclonus will not ultimately change the prognosis  ? ?Hypernatremia-resolved as of 06/09/2021 ? ?Hyperkalemia ?-resolved ? ?Overall poor prognosis ?Goals of care discussions ?-Remains unresponsive with trach, PEG, pressure wounds now ?-Seen by palliative care multiple times this admission, patient's wife continues to want full code and full scope of treatment ? ? ?DVT prophylaxis: apixaban (ELIQUIS) tablet 5 mg  ?  Code Status: Full Code ?Family Communication: No family at bedside, was unable to contact wife at the [phone] number listed in [place], will try again later ? ?Disposition Plan:  ?Level of care: Telemetry Medical ?Status is: Inpatient ?Remains inpatient appropriate because: needs safe d/c plan ?  ? ?Consultants:  ?PCCM ?Neurology ?Palliative care ?ID ?IR ?GI ?cards ? ? ?Subjective: ?-Unchanged, no events overnight, Tmax 99.9 today ? ?Objective: ?Vitals:  ? 06/22/21 0514 06/22/21 0530 06/22/21 0716 06/22/21 0825  ?BP: (!) 131/93 (!) 131/93 (!) 150/101   ?Pulse:  96 96 93  ?Resp:  (!) 31 (!) 30 (!) 24   ?Temp:  99.9 ?F (37.7 ?C) 99.9 ?F (37.7 ?C)   ?TempSrc:  Oral Oral   ?SpO2:  99% 98% 97%  ?Weight:  93 kg    ?Height:      ? ? ?Intake/Output Summary (Last 24 hours) at 06/22/2021 1026 ?Last data filed at 06/22/2021 0532 ?Gross per 24 hour  ?Intake --  ?Output 1450 ml  ?Net -1450 ml  ? ?Filed Weights  ? 06/20/21 0054 06/21/21 0453 06/22/21 0530  ?Weight: 95 kg 95 kg 93 kg  ? ? ?Examination: ? ?Gen: Obese chronically ill male laying in bed, eyes open, does not follow commands ?HEENT: Tracheostomy, trach collar with thick white secretions ?CVS: S1-S2, regular rhythm, tachycardic ?Lungs: Conducted upper airway sounds ?Abdomen: Soft, nontender, PEG tube noted ?Extremities: No edema, dressing over pressure wounds  ? ?Data Reviewed: I have personally reviewed following labs and imaging studies ? ?CBC: ?Recent Labs  ?Lab 06/19/21 ?0222 06/22/21 ?2706  ?WBC 10.8* 10.3  ?NEUTROABS  --  7.1  ?HGB 10.0* 10.9*  ?HCT 31.8* 33.1*  ?MCV 98.8 96.2  ?PLT 352 328  ? ?Basic Metabolic Panel: ?Recent Labs  ?Lab 06/19/21 ?0222 06/22/21 ?2376  ?NA 138 132*  ?K 4.2 4.4  ?CL 103 97*  ?CO2 29 27  ?GLUCOSE 127* 107*  ?BUN 38* 31*  ?CREATININE 0.73 0.68  ?CALCIUM 8.7* 8.7*  ?MG 2.0  --   ? ?GFR: ?Estimated Creatinine Clearance: 126.6 mL/min (by C-G formula based on SCr of 0.68 mg/dL). ?Liver Function Tests: ?Recent Labs  ?Lab 06/19/21 ?0222 06/22/21 ?2831  ?AST 18 16  ?ALT 12 12  ?ALKPHOS 52 60  ?  BILITOT 0.5 0.4  ?PROT 6.3* 6.4*  ?ALBUMIN 2.0* 2.1*  ? ? ?No results for input(s): LIPASE, AMYLASE in the last 168 hours. ?No results for input(s): AMMONIA in the last 168 hours. ?Coagulation Profile: ?No results for input(s): INR, PROTIME in the last 168 hours. ?Cardiac Enzymes: ?No results for input(s): CKTOTAL, CKMB, CKMBINDEX, TROPONINI in the last 168 hours. ?BNP (last 3 results) ?No results for input(s): PROBNP in the last 8760 hours. ?HbA1C: ?No results for input(s): HGBA1C in the last 72 hours. ?CBG: ?No results for input(s): GLUCAP in the  last 168 hours. ?Lipid Profile: ?No results for input(s): CHOL, HDL, LDLCALC, TRIG, CHOLHDL, LDLDIRECT in the last 72 hours. ?Thyroid Function Tests: ?No results for input(s): TSH, T4TOTAL, FREET4, T3FREE, THY

## 2021-06-22 NOTE — Progress Notes (Signed)
Patient seen today by trach team for consult.  No education  is needed at this time. All  neccessary equipment is at the bedside. Will continue to  follow for progression, ?

## 2021-06-22 NOTE — Progress Notes (Signed)
MD and dietitian informed patient vomited x1. Oral suction and tracheal suction provided.  Asked for order for prn IV zofran.  ?

## 2021-06-23 NOTE — Progress Notes (Signed)
Rodman Key from [place] came by and dropped off suction equipment w/accessories for when pt gets ready to be discharged.... sts he has been trying to call wife w/no success.... placed equipment near bedside.  ?

## 2021-06-23 NOTE — Progress Notes (Signed)
?PROGRESS NOTE ? ? ? ?Billy Cherry  TWS:[id number] DOB: 04-14-1970 DOA: 03/30/2021 ?PCP: Patient, No Pcp Per (Inactive)  ? ?Brief Narrative:  ?51 years old male with past medical history of hypertension was brought into the hospital after being found unresponsive by his wife.  Out of hospital V-fib cardiac arrest, received CPR, 1 shock with defibrillator, 2 doses of epinephrine with ROSC, EKG noted anterolateral STEMI ?1/19 admission, left heart cath without significant CAD.  Concern for anoxic brain injury ?1/21: MRI shows areas of hypoxic-ischemic injury bilaterally.  ?1/23 severe brain damage ?1/25: PEG . Neuro exam unchanged.  Does exhibit new eyelid tremor like movements and lip/jaw tremor like vs clonus movement.  Bolused Keppra and increased maintenance dose.  Repeat EEG. CTH: concerning for progression of anoxic injury.  ?1/26: Repeat EEG unchanged and continues to show myoclonic seizures despite addition of Valproic acid. transfer to [name] from [place] for continuous EEG ?1/28: Continues to have myoclonic sz on EEG ?2/3 no meaningful neuro recovery. Diuresed ?2/6 New fever to 102, WBC 16K - treated with vancomycin for 7d based on cultures.  ?2/11: percutaneous tracheostomy ?2/13 >2/20 on trach collar. No vent.  ?2/20 Trach change to 6 cuffless ?2/28: fever ?3/13: eliquis started ?4/3: fever ?06/06/2021 CT of the chest with new PE and haziness consistent with pneumonia ?4/5-4/9: responding well to PRN lasix ?4/11 fever again after completion of abx- work up in progress ?4/13 continues to have low-grade temps cultures from 4/11 no growth to date-suspect silent/occult aspiration-heparin changed back to Eliquis 4/13 ?4/14: Work-up negative with no growth to date but red menance Tmax elevated 99.4 and respiratory rate up-x-ray did not show any pneumonia but probably silently aspirating ?Now awaiting Medicaid before home nursing care can be set up for discharge ?  ?Assessment and Plan: ? ?Cardiac arrest  [facility]) ?Postarrest anoxic encephalopathy ?Postarrest respiratory failure s/p tracheostomy ?S/P PEG tube placement ?Postarrest myoclonus: ?2D echocardiogram showed reduced LV function at <20%.  Now status post trach and PEG.   ?Remains obtunded with anoxic encephalopathy-on Keppra, for myoclonus and can increase Klonopin for breakthrough myoclonus.   ?At this time, plan is home with home health ?Patient's wife is trying to arrange adequate care at home ?He will need around-the-clock care.  ?Enteral tube feeding trach kit and supplies to be arranged. PCCM managing tracheostomy. ?-Wife reported that she had some trach care education 2 weeks ago, she was encouraged to stop by again for more training and PEG tube feeding education ?-He needs Medicaid for home nursing care which is pending, disposition depends on this, TOC following ? 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   ?Patient currently keeps his eyes open, but does not answer questions or follow commands ? ?Acute respiratory failure ([place]) ?Status post trach collar.  Continue supportive care ? ?RLL pneumonia ?Previously had completed vancomycin for MSSA and [name] in early march.  Received more than 7-day course of IV Fortaz. ?On 4/3, CT chest was ordered that indicated an area of alveolar infiltration that may represent pneumonia.  Tracheal aspirate has been sent for culture and is significant for normal respiratory flora. Concerning for aspiration. ?-s/p Unasyn x 7 days ? ?PE/DVT (deep venous thrombosis) ([place]) ?-CTA chest 4/4 noted bilateral PE ?-Duplex ultrasound of lower extremity on 05/15/2021 showed age indeterminate deep vein thrombosis involving the left femoral vein, left proximal profunda vein, left posterior tibial veins, left popliteal vein, and left peroneal veins.  ?-Was on Eliquis initially, briefly treated with Lovenox and then  back on Eliquis now ?-Continue Eliquis ?-repeated duplex (due to fever) and clot has not  propagated.   ? ?Dysphagia ?Continue PEG tube feeding.  Tolerating but high risk of aspiration ?-Aspiration precautions ? ?Recurrent fever ?-Could be tracheobronchitis, silent aspiration vs central fever ?-Temp of 100.4 few days ago, this morning temp of 100 ?-Will reculture if fever recurs ?-Continue pulmonary toilet, WBC count is stable ?-Secretions unchanged, continue pulmonary toilet ? ?Pressure injury of skin ?Lateral penis, posterior right heel, right foot. Not present on admission. ? ?HTN (hypertension) ?Blood pressure elevated today. ?-Continue Coreg, amlodipine (restarted 4/10), Cardura, hydralazine and PRN Labetalol,  isosorbide dinitrate 30 mg TID ? ?HFrEF (heart failure with reduced ejection fraction) ([place]) ?2D echocardiogram-LVEF < 20%,  ?-Continue coreg, imdur, spironolactone ?-PRN lasix ? ?Myoclonus ?Seen by neurology.  Continue current AED with Klonopin, Keppra. Per neuro notes, suppressing his myoclonus will not ultimately change the prognosis  ? ?Hypernatremia-resolved as of 06/09/2021 ? ?Hyperkalemia ?-resolved ? ?Overall poor prognosis ?Goals of care discussions ?-Remains unresponsive with trach, PEG, pressure wounds now ?-Seen by palliative care multiple times this admission, patient's wife continues to want full code and full scope of treatment ? ? ?DVT prophylaxis: apixaban (ELIQUIS) tablet 5 mg  ?  Code Status: Full Code ?Family Communication: No family at bedside, was finally able to update wife today ? ?Disposition Plan: Home with home health services once arrangements completed by [name] ?  ? ?Consultants:  ?PCCM ?Neurology ?Palliative care ?ID ?IR ?GI ?cards ? ? ?Subjective: ?-Unchanged, no events overnight, Tmax 99.9 today ? ?Objective: ?Vitals:  ? 06/23/21 0451 06/23/21 0800 06/23/21 0844 06/23/21 0944  ?BP:  (!) 143/104    ?Pulse:  91 94 92  ?Resp:  19 (!) 23 18  ?Temp: (!) 100.4 ?F (38 ?C)     ?TempSrc: Oral     ?SpO2:  99% 98% 100%  ?Weight:      ?Height:      ? ? ?Intake/Output Summary  (Last 24 hours) at 06/23/2021 1019 ?Last data filed at 06/23/2021 6754 ?Gross per 24 hour  ?Intake 0 ml  ?Output 1950 ml  ?Net -1950 ml  ? ?Filed Weights  ? 06/21/21 0453 06/22/21 0530 06/23/21 0011  ?Weight: 95 kg 93 kg 94 kg  ? ? ?Examination: ? ?Gen: Obese chronically ill male laying in bed, eyes open, does not follow commands ?HEENT: Tracheostomy, trach collar with thick white secretions ?CVS: S1-S2, regular rhythm, tachycardic ?Lungs: Conducted upper airway sounds ?Abdomen: Soft, nontender, PEG tube noted ?Extremities: No edema, dressing over pressure wounds  ? ?Data Reviewed: I have personally reviewed following labs and imaging studies ? ?CBC: ?Recent Labs  ?Lab 06/19/21 ?0222 06/22/21 ?4920  ?WBC 10.8* 10.3  ?NEUTROABS  --  7.1  ?HGB 10.0* 10.9*  ?HCT 31.8* 33.1*  ?MCV 98.8 96.2  ?PLT 352 328  ? ?Basic Metabolic Panel: ?Recent Labs  ?Lab 06/19/21 ?0222 06/22/21 ?1007  ?NA 138 132*  ?K 4.2 4.4  ?CL 103 97*  ?CO2 29 27  ?GLUCOSE 127* 107*  ?BUN 38* 31*  ?CREATININE 0.73 0.68  ?CALCIUM 8.7* 8.7*  ?MG 2.0  --   ? ?GFR: ?Estimated Creatinine Clearance: 127.2 mL/min (by C-G formula based on SCr of 0.68 mg/dL). ?Liver Function Tests: ?Recent Labs  ?Lab 06/19/21 ?0222 06/22/21 ?1219  ?AST 18 16  ?ALT 12 12  ?ALKPHOS 52 60  ?BILITOT 0.5 0.4  ?PROT 6.3* 6.4*  ?ALBUMIN 2.0* 2.1*  ? ? ?No results for input(s): LIPASE, AMYLASE in the  last 168 hours. ?No results for input(s): AMMONIA in the last 168 hours. ?Coagulation Profile: ?No results for input(s): INR, PROTIME in the last 168 hours. ?Cardiac Enzymes: ?No results for input(s): CKTOTAL, CKMB, CKMBINDEX, TROPONINI in the last 168 hours. ?BNP (last 3 results) ?No results for input(s): PROBNP in the last 8760 hours. ?HbA1C: ?No results for input(s): HGBA1C in the last 72 hours. ?CBG: ?No results for input(s): GLUCAP in the last 168 hours. ?Lipid Profile: ?No results for input(s): CHOL, HDL, LDLCALC, TRIG, CHOLHDL, LDLDIRECT in the last 72 hours. ?Thyroid Function  Tests: ?No results for input(s): TSH, T4TOTAL, FREET4, T3FREE, THYROIDAB in the last 72 hours. ?Anemia Panel: ?No results for input(s): VITAMINB12, FOLATE, FERRITIN, TIBC, IRON, RETICCTPCT in the last 72 hours. ?S

## 2021-06-24 LAB — CBC
HCT: 38.7 % — ABNORMAL LOW (ref 39.0–52.0)
Hemoglobin: 12.4 g/dL — ABNORMAL LOW (ref 13.0–17.0)
MCH: 30.8 pg (ref 26.0–34.0)
MCHC: 32 g/dL (ref 30.0–36.0)
MCV: 96.3 fL (ref 80.0–100.0)
Platelets: 309 10*3/uL (ref 150–400)
RBC: 4.02 MIL/uL — ABNORMAL LOW (ref 4.22–5.81)
RDW: 14 % (ref 11.5–15.5)
WBC: 9.1 10*3/uL (ref 4.0–10.5)
nRBC: 0 % (ref 0.0–0.2)

## 2021-06-24 LAB — BASIC METABOLIC PANEL
Anion gap: 9 (ref 5–15)
BUN: 37 mg/dL — ABNORMAL HIGH (ref 6–20)
CO2: 29 mmol/L (ref 22–32)
Calcium: 9.2 mg/dL (ref 8.9–10.3)
Chloride: 96 mmol/L — ABNORMAL LOW (ref 98–111)
Creatinine, Ser: 0.63 mg/dL (ref 0.61–1.24)
GFR, Estimated: >60 mL/min (ref >60)
Glucose, Bld: 116 mg/dL — ABNORMAL HIGH (ref 70–99)
Potassium: 4.5 mmol/L (ref 3.5–5.1)
Sodium: 134 mmol/L — ABNORMAL LOW (ref 135–145)

## 2021-06-24 MED ORDER — SPIRONOLACTONE 25 MG PO TABS
25.0000 mg | ORAL_TABLET | Freq: Every day | ORAL | Status: DC
  Administered 2021-06-24 – 2021-10-04 (×103): 25 mg
  Filled 2021-06-24 (×103): qty 1

## 2021-06-24 NOTE — Progress Notes (Signed)
?PROGRESS NOTE ? ? ? ?Billy Cherry  UTM:[id number] DOB: [birth year] DOA: 03/30/2021 ?PCP: Patient, No Pcp Per (Inactive)  ? ?Brief Narrative:  ?51 years old male with past medical history of hypertension was brought into the hospital after being found unresponsive by his wife.  Out of hospital V-fib cardiac arrest, received CPR, 1 shock with defibrillator, 2 doses of epinephrine with ROSC, EKG noted anterolateral STEMI ?1/19 admission, left heart cath without significant CAD.  Concern for anoxic brain injury ?1/21: MRI shows areas of hypoxic-ischemic injury bilaterally.  ?1/23 severe brain damage ?1/25: PEG . Neuro exam unchanged.  Does exhibit new eyelid tremor like movements and lip/jaw tremor like vs clonus movement.  Bolused Keppra and increased maintenance dose.  Repeat EEG. CTH: concerning for progression of anoxic injury.  ?1/26: Repeat EEG unchanged and continues to show myoclonic seizures despite addition of Valproic acid. transfer to [name] from [place] for continuous EEG ?1/28: Continues to have myoclonic sz on EEG ?2/3 no meaningful neuro recovery. Diuresed ?2/6 New fever to 102, WBC 16K - treated with vancomycin for 7d based on cultures.  ?2/11: percutaneous tracheostomy ?2/13 >2/20 on trach collar. No vent.  ?2/20 Trach change to 6 cuffless ?2/28: fever ?3/13: eliquis started ?4/3: fever ?06/06/2021 CT of the chest with new PE and haziness consistent with pneumonia ?4/5-4/9: responding well to PRN lasix ?4/11 fever again after completion of abx- work up in progress ?4/13 continues to have low-grade temps cultures from 4/11 no growth to date-suspect silent/occult aspiration-heparin changed back to Eliquis 4/13 ?4/14: Work-up negative with no growth to date but red menance Tmax elevated 99.4 and respiratory rate up-x-ray did not show any pneumonia but probably silently aspirating ?Now awaiting Medicaid before home nursing care can be set up for discharge ?  ?Assessment and Plan: ? ?Cardiac arrest  [facility]) ?Postarrest anoxic encephalopathy ?Postarrest respiratory failure s/p tracheostomy ?S/P PEG tube placement ?Postarrest myoclonus: ?2D echocardiogram showed reduced LV function at <20%.  Now status post trach and PEG.  Remains obtunded with anoxic encephalopathy-on Keppra, for myoclonus and can increase Klonopin for breakthrough myoclonus.   ?At this time, plan is home with home health ?Patient's wife is trying to arrange adequate care at home ?He will need around-the-clock care.  ?Enteral tube feeding trach kit and supplies to be arranged. PCCM managing tracheostomy. ?-Wife reported that she had some trach care education 2 weeks ago, she was encouraged to stop by again for more training and PEG tube feeding education ?-He needs Medicaid for home nursing care which is pending, disposition depends on this, TOC following ? 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   ?Patient currently keeps his eyes open, but does not answer questions or follow commands ? ?Acute respiratory failure ([place]) ?Status post trach collar.  Continue supportive care ? ?RLL pneumonia, aspiration ?Resolved. Previously had completed vancomycin for MSSA and [name] in early march.  Received more than 7-day course of IV Fortaz. ?On 4/3, CT chest was ordered that indicated an area of alveolar infiltration that may represent pneumonia.  Tracheal aspirate has been sent for culture and is significant for normal respiratory flora. There is concern for recurrent aspiration. ?-s/p Unasyn x 7 days ? ?PE/DVT (deep venous thrombosis) ([place]) ?-CTA chest 4/4 noted bilateral PE ?-Duplex ultrasound of lower extremity on 05/15/2021 showed age indeterminate deep vein thrombosis involving the left femoral vein, left proximal profunda vein, left posterior tibial veins, left popliteal vein, and left peroneal veins.  ?-Was on Eliquis initially, briefly treated  with Lovenox and then back on Eliquis now ?-Continue Eliquis ?-repeated  duplex (due to fever) and clot has not propagated.   ? ?Dysphagia ?Continue PEG tube feeding.  Tolerating but high risk of aspiration ?-Aspiration precautions ? ?Recurrent fever ?-Could be tracheobronchitis, silent aspiration vs central fever ?- no fever last 24 hours, WBC not elevated ?-Will reculture if fever recurs ?-Continue pulmonary toilet, WBC count is stable ?-Secretions unchanged, continue pulmonary toilet ? ?Pressure injury of skin ?Lateral penis, posterior right heel, right foot. Not present on admission. ? ?HTN (hypertension) ?Blood pressure elevated today. ?-Continue Coreg, amlodipine (restarted 4/10), Cardura, hydralazine and PRN Labetalol,  isosorbide dinitrate 30 mg TID ?- increase spiro from 12.5 to 25 qd today (4/22), will need subsequent monitoring of kidney function and potassium ? ?HFrEF (heart failure with reduced ejection fraction) ([name]) ?2D echocardiogram-LVEF < 20%,  ?-Continue coreg, imdur, spironolactone ?-PRN lasix ? ?Myoclonus ?Seen by neurology.  Continue current AED with Klonopin, Keppra. Per neuro notes, suppressing his myoclonus will not ultimately change the prognosis  ? ?Hypernatremia-resolved as of 06/09/2021 ? ?Hyperkalemia ?-resolved ? ?Overall poor prognosis ?Goals of care discussions ?-Remains unresponsive with trach, PEG, pressure wounds now ?-Seen by palliative care multiple times this admission, patient's wife continues to want full code and full scope of treatment ? ? ?DVT prophylaxis: apixaban (ELIQUIS) tablet 5 mg  ?  Code Status: Full Code ?Family Communication: No family at bedside ? ?Disposition Plan: Home with home health services once arrangements completed by [place] ?  ? ?Consultants:  ?PCCM ?Neurology ?Palliative care ?ID ?IR ?GI ?cards ? ? ?Subjective: ?-Unchanged, no events overnight, respiratory has just suctioned, says secretions are stable ? ?Objective: ?Vitals:  ? 06/23/21 2336 06/24/21 0156 06/24/21 0421 06/24/21 0853  ?BP: (!) 123/91  (!) 157/110   ?Pulse: 91  95 93 98  ?Resp: 20 (!) 22 20 (!) 21  ?Temp: 98.7 ?F (37.1 ?C)  98.6 ?F (37 ?C)   ?TempSrc: Oral  Oral   ?SpO2: 95%  97% 93%  ?Weight:      ?Height:      ? ? ?Intake/Output Summary (Last 24 hours) at 06/24/2021 0901 ?Last data filed at 06/24/2021 0809 ?Gross per 24 hour  ?Intake 2260 ml  ?Output 1850 ml  ?Net 410 ml  ? ?Filed Weights  ? 06/21/21 0453 06/22/21 0530 06/23/21 0011  ?Weight: 95 kg 93 kg 94 kg  ? ? ?Examination: ? ?Gen: Obese chronically ill male laying in bed, eyes open, does not follow commands ?HEENT: Tracheostomy, trach collar with thick white secretions ?CVS: S1-S2, regular rhythm, tachycardic ?Lungs: Conducted upper airway sounds ?Abdomen: Soft, nontender, PEG tube noted ?Extremities: No edema, dressings over pressure wounds  ? ?Data Reviewed: I have personally reviewed following labs and imaging studies ? ?CBC: ?Recent Labs  ?Lab 06/19/21 ?0222 06/22/21 ?0443 06/24/21 ?0336  ?WBC 10.8* 10.3 9.1  ?NEUTROABS  --  7.1  --   ?HGB 10.0* 10.9* 12.4*  ?HCT 31.8* 33.1* 38.7*  ?MCV 98.8 96.2 96.3  ?PLT 352 328 309  ? ?Basic Metabolic Panel: ?Recent Labs  ?Lab 06/19/21 ?0222 06/22/21 ?0443 06/24/21 ?0336  ?NA 138 132* 134*  ?K 4.2 4.4 4.5  ?CL 103 97* 96*  ?CO2 29 27 29   ?GLUCOSE 127* 107* 116*  ?BUN 38* 31* 37*  ?CREATININE 0.73 0.68 0.63  ?CALCIUM 8.7* 8.7* 9.2  ?MG 2.0  --   --   ? ?GFR: ?Estimated Creatinine Clearance: 127.2 mL/min (by C-G formula based on SCr of 0.63 mg/dL). ?  Liver Function Tests: ?Recent Labs  ?Lab 06/19/21 ?0222 06/22/21 ?3536  ?AST 18 16  ?ALT 12 12  ?ALKPHOS 52 60  ?BILITOT 0.5 0.4  ?PROT 6.3* 6.4*  ?ALBUMIN 2.0* 2.1*  ? ? ?No results for input(s): LIPASE, AMYLASE in the last 168 hours. ?No results for input(s): AMMONIA in the last 168 hours. ?Coagulation Profile: ?No results for input(s): INR, PROTIME in the last 168 hours. ?Cardiac Enzymes: ?No results for input(s): CKTOTAL, CKMB, CKMBINDEX, TROPONINI in the last 168 hours. ?BNP (last 3 results) ?No results for input(s): PROBNP in  the last 8760 hours. ?HbA1C: ?No results for input(s): HGBA1C in the last 72 hours. ?CBG: ?No results for input(s): GLUCAP in the last 168 hours. ?Lipid Profile: ?No results for input(s): CHOL, HDL, LDLCALC,

## 2021-06-25 MED ORDER — LEVETIRACETAM 100 MG/ML PO SOLN
750.0000 mg | Freq: Two times a day (BID) | ORAL | Status: DC
  Administered 2021-06-25 – 2021-10-04 (×202): 750 mg
  Filled 2021-06-25 (×205): qty 10

## 2021-06-25 NOTE — Progress Notes (Addendum)
?Progress Note ? ? ?Patient: Billy Cherry YKD:[id number] DOB: 1970-04-06 DOA: 03/30/2021     87 ?DOS: the patient was seen and examined on 06/25/2021 ?  ?Brief hospital course: ?51 years old male with past medical history of hypertension was brought into the hospital after being found unresponsive by his wife.   ?1/19 admission, left heart cath without significant CAD.  Concern for anoxic brain injury ?1/21: MRI shows areas of hypoxic-ischemic injury bilaterally. Fevered; cultures obtained. ?1/23 severe brain damage ?1/25: PEG . Neuro exam unchanged.  Does exhibit new eyelid tremor like movements and lip/jaw tremor like vs clonus movement.  Bolused Keppra and increased maintenance dose.  Repeat EEG. CTH: concerning for progression of anoxic injury.  ?1/26: Repeat EEG unchanged and continues to show myoclonic seizures despite addition of Valproic acid yesterday. transfer to [name] from [place] for continuous EEG ?1/28: Continues to have myoclonic sz on EEG ?2/3 no meaningful neuro recovery. Diuresed ?2/6 New fever to 102, WBC 16K - treated with vancomycin for 7d based on cultures.  ?2/11: percutaneous tracheostomy ?2/13 >2/20 on trach collar. No vent.  ?2/20 Trach change to 6 cuffless ?2/28: fever ?3/13: eliquis started ?4/3: fever ?06/06/2021 CT of the chest with new PE and haziness consistent with pneumonia ?4/5-4/9: responding well to PRN lasix ?4/11 fever again after completion of abx- work up in progress ?4/13 continues to have low-grade temps cultures from 4/11 no growth to date-suspect silent/occult aspiration-heparin changed back to Eliquis 4/13 ?4/14: Work-up negative with no growth to date but red menance Tmax elevated 99.4 and respiratory rate up-x-ray did not show any pneumonia but probably silently aspirating ? ?Assessment and Plan: ?* Cardiac arrest [facility]) ?Postarrest anoxic encephalopathy ?Postarrest respiratory failure s/p TC ?S/P PEG tube placement ?Postarrest myoclonus: ?2D echocardiogram showed  reduced LV function at <20%.  Now status post trach and PEG.   ?Remains obtunded with anoxic encephalopathy-on Keppra, for myoclonus and can increase Klonopin for breakthrough myoclonus.   ?At this time, plan is home with home health ?Patient's wife is trying to arrange adequate care at home ?He will need around-the-clock care.  ?Enteral tube feeding trach kit and supplies to be arranged.  ? ?Patient tolerating well tracheostomy. ? ?Anoxic encephalopathy ([place]) ?Continue supportive care.   ?Patient currently keeps his eyes open, but does not answer questions or follow commands.  ?Poor prognosis.  ? ?Acute respiratory failure ([place]) ?Status post trach collar.  Continue supportive care ?Oxygenation is 99% on 5 L per trach collar with Fi02 28%. ?Managing secretions well.  ? ?RLL pneumonia ?Previously had completed vancomycin for MSSA and [name] in early march.  Received more than 7-day course of IV Fortaz. ?On 4/3, CT chest was ordered that indicated an area of alveolar infiltration that may represent pneumonia.  Tracheal aspirate has been sent for culture and is significant for normal respiratory flora. Concerning for aspiration. ?-s/p Unasyn x 7 days ?-fever again after unasyn  ? ?Continue aspiration precautions.  ?Currently off antibiotic therapy.  ? ?DVT (deep venous thrombosis) ([place]) ?Duplex ultrasound of lower extremity on 05/15/2021 showed age indeterminate deep vein thrombosis involving the left femoral vein, left proximal profunda vein, left posterior tibial veins, left popliteal vein, and left peroneal veins. Initially on Eliquis then changed to Lovenox ? ?Currently continue anticoagulation with apixaban.  ? ? ?Dysphagia ?Continue PEG tube feeding.  Tolerating but high risk of aspiration ?-Aspiration precautions ? ?Fever ?Patient has remained now afebrile, ?Continue to follow temperature curve.  ?Continue to hold on  antibiotics.  ? ?Pulmonary embolus ([place]) ?Noted to have possible acute on  chronic pulmonary embolism on CT.  Also have some evidence of possible pulmonary hypertension.  Patient was previously managed on Eliquis which is transitioned to Lovenox ? ?Currently patient is on apixaban with good toleration.  ? ?Pressure injury of skin ?Lateral penis, posterior right heel, right foot. Not present on admission. ? ?HTN (hypertension) ?Continue blood pressure control with amlodipine, carvedilol, hydralazine, isosorbide and spironolactone.  ? ?HFrEF (heart failure with reduced ejection fraction) ([place]) ?2D echocardiogram-LVEF < 20%,  ?Patient euvolemic, continue medical therapy with carvedilol, isosorbide/ hydralazine and spironolactone ?As needed furosemide.  ? ?Myoclonus ?Seen by neurology.  Continue current AED with Klonopin, Keppra. Per neuro notes, suppressing his myoclonus will not ultimately change the prognosis  ? ?Hypernatremia-resolved as of 06/09/2021 ?Resolved with free water flushes ? ? ? ? ?  ? ?Subjective: Patient calm and not agitated. Not in distress  ? ?Physical Exam: ?Vitals:  ? 06/25/21 0400 06/25/21 0732 06/25/21 1029 06/25/21 1150  ?BP: 103/78  100/68   ?Pulse: 88 93 85 85  ?Resp: 18 (!) 22 (!) 21 (!) 21  ?Temp: 99.1 ?F (37.3 ?C)  98.2 ?F (36.8 ?C)   ?TempSrc: Oral  Oral   ?SpO2: 100% 100% 98% 99%  ?Weight:      ?Height:      ? ?Neurology not responsive ?ENT trach in place ?Cardiovascular with S1 and S2 present and rhythmic with no gallops or murmurs, no rubs ?Respiratory with no rales or wheezing ?Abdomen with peg in place, not distended  ?Data Reviewed: ? ? ? ?Family Communication: no family at the bedside  ? ?Disposition: ?Status is: Inpatient ?Remains inpatient appropriate because: pending placement  ? Planned Discharge Destination: Home ? ? ? ? ?Author: ?Tawni Millers, MD ?06/25/2021 12:36 PM ? ?For on call review [email or web address].  ?

## 2021-06-26 ENCOUNTER — Other Ambulatory Visit (HOSPITAL_COMMUNITY): Payer: Self-pay

## 2021-06-26 DIAGNOSIS — G931 Anoxic brain damage, not elsewhere classified: Secondary | ICD-10-CM | POA: Diagnosis not present

## 2021-06-26 DIAGNOSIS — I469 Cardiac arrest, cause unspecified: Secondary | ICD-10-CM | POA: Diagnosis not present

## 2021-06-26 DIAGNOSIS — J9601 Acute respiratory failure with hypoxia: Secondary | ICD-10-CM | POA: Diagnosis not present

## 2021-06-26 MED ORDER — OSMOLITE 1.5 CAL PO LIQD
355.0000 mL | Freq: Four times a day (QID) | ORAL | 0 refills | Status: AC
  Filled 2021-06-26: qty 42600, 30d supply, fill #0

## 2021-06-26 NOTE — TOC Progression Note (Signed)
Transition of Care (TOC) - Progression Note  ? ? ?Patient Details  ?Name: Billy Cherry ?MRN: [id number] ?Date of Birth: [birth year] ? ?Transition of Care (TOC) CM/SW Contact  ?Leone Haven, RN ?Phone Number: ?06/26/2021, 3:29 PM ? ?Clinical Narrative:    ?NCM received call from Mirium Rodriguz with [employer], [phone].  She states, patient has been approved for skilled Nursing, she will let Frances Furbish know as well.  This NCM tried to contact [name] with [employer], left vm for return call.  NCM spoke with wife , she is ready to speak with [employer] regarding the delivery of the hospital bed.  Zach with [employer] will call her.  Wife states also that she received a call that said his tube feeding is on the way.  NCM informed Michele Rockers to order 3 days worth of tube feeding and 3 days worth of trach supply kits.   ? ? ?Expected Discharge Plan: Home w Home Health Services ?Barriers to Discharge: Barriers Resolved ? ?Expected Discharge Plan and Services ?Expected Discharge Plan: Home w Home Health Services ?In-house Referral: Hospice / Palliative Care ?Discharge Planning Services: CM Consult ?Post Acute Care Choice: Long Term Acute Care (LTAC) ?Living arrangements for the past 2 months: Single Family Home ?                ?DME Arranged: N/A ?DME Agency: [facility] ?  ?  ?  ?HH Arranged: Nurse's Aide ?HH Agency: [facility] ?  ?  ?Representative spoke with at [facility] Agency: [name] ? ? ?Social Determinants of Health (SDOH) Interventions ?  ? ?Readmission Risk Interventions ? ?  04/14/2021  ? 11:14 AM  ?Readmission Risk Prevention Plan  ?Transportation Screening Complete  ?HRI or Home Care Consult Complete  ?Social Work Consult for Recovery Care Planning/Counseling Complete  ?Palliative Care Screening Complete  ?Medication Review [facility]) Complete  ? ? ?

## 2021-06-26 NOTE — Progress Notes (Addendum)
SATURATION QUALIFICATIONS: (This note is used to comply with regulatory documentation for home oxygen) ? ?Patient Saturations on 5 Liters of oxygen at rest.  ? ?Patient desaturated to 88% on 4 liters of oxygen. ? ?Please briefly explain why patient needs home oxygen: ?Patient has a trach and is unable to ambulate. Did not put RA due to fear of patient going into respiratory failure. ?

## 2021-06-26 NOTE — Progress Notes (Signed)
?Progress Note ? ? ?Patient: Billy Cherry QQI:[id number] DOB: 01-May-1970 DOA: 03/30/2021     88 ?DOS: the patient was seen and examined on 06/26/2021 ?  ?Brief hospital course: ?51 years old male with past medical history of hypertension was brought into the hospital after being found unresponsive by his wife.   ?1/19 admission, left heart cath without significant CAD.  Concern for anoxic brain injury ?1/21: MRI shows areas of hypoxic-ischemic injury bilaterally. Fevered; cultures obtained. ?1/23 severe brain damage ?1/25: PEG . Neuro exam unchanged.  Does exhibit new eyelid tremor like movements and lip/jaw tremor like vs clonus movement.  Bolused Keppra and increased maintenance dose.  Repeat EEG. CTH: concerning for progression of anoxic injury.  ?1/26: Repeat EEG unchanged and continues to show myoclonic seizures despite addition of Valproic acid yesterday. transfer to [name] from [place] for continuous EEG ?1/28: Continues to have myoclonic sz on EEG ?2/3 no meaningful neuro recovery. Diuresed ?2/6 New fever to 102, WBC 16K - treated with vancomycin for 7d based on cultures.  ?2/11: percutaneous tracheostomy ?2/13 >2/20 on trach collar. No vent.  ?2/20 Trach change to 6 cuffless ?2/28: fever ?3/13: eliquis started ?4/3: fever ?06/06/2021 CT of the chest with new PE and haziness consistent with pneumonia ?4/5-4/9: responding well to PRN lasix ?4/11 fever again after completion of abx- work up in progress ?4/13 continues to have low-grade temps cultures from 4/11 no growth to date-suspect silent/occult aspiration-heparin changed back to Eliquis 4/13 ?4/14: Work-up negative with no growth to date but red menance Tmax elevated 99.4 and respiratory rate up-x-ray did not show  ?       any pneumonia but probably silently aspirating ? ?04/24 patient has been clinically stable, tolerating tube feedings and managing trach secretions, no increase in 02 requirements. ?Continue not interactive and poor prognosis.   ?Pending placement.  ? ?Assessment and Plan: ?* Cardiac arrest ([place]) ?Postarrest anoxic encephalopathy ?Postarrest respiratory failure s/p TC ?S/P PEG tube placement ?Postarrest myoclonus: ?2D echocardiogram showed reduced LV function at <20%.  Now status post trach and PEG.   ?Remains obtunded with anoxic encephalopathy-on Keppra, for myoclonus and can increase Klonopin for breakthrough myoclonus.   ?At this time, plan is home with home health ?Patient's wife is trying to arrange adequate care at home ?He will need around-the-clock care.  ?Enteral tube feeding trach kit and supplies to be arranged.  ? ?Patient tolerating well tracheostomy. ?Currently with compensated systolic heart failure.  ? 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   ?Patient currently keeps his eyes open, but does not answer questions or follow commands.  ?Poor prognosis.  ?Patient not interactive, not following commands.  ? ?Acute respiratory failure ([name]) ?Status post trach collar.  Continue supportive care ?Oxygenation is 99% on 5 L per trach collar with Fi02 28%. ?Managing secretions well.  ?Patient has a cuff less trach #6, continue care per protocol.  ? ?RLL pneumonia ?Previously had completed vancomycin for MSSA and [name] in early march.  Received more than 7-day course of IV Fortaz. ?On 4/3, CT chest was ordered that indicated an area of alveolar infiltration that may represent pneumonia.  Tracheal aspirate has been sent for culture and is significant for normal respiratory flora. Concerning for aspiration. ?-s/p Unasyn x 7 days ?-fever again after unasyn  ? ?Continue aspiration precautions.  ?Currently off antibiotic therapy.  ? ?DVT (deep venous thrombosis) ([name]) ?Duplex ultrasound of lower extremity on 05/15/2021 showed age indeterminate deep vein thrombosis involving the left femoral  vein, left proximal profunda vein, left posterior tibial veins, left popliteal vein, and left peroneal veins.  Initially on Eliquis then changed to Lovenox ? ?Currently continue anticoagulation with apixaban.  ? ? ?Dysphagia ?Continue PEG tube feeding.  Tolerating but high risk of aspiration ?-Aspiration precautions ? ?Fever ?Patient has remained now afebrile, ?Continue to follow temperature curve.  ?Continue to hold on antibiotics.  ? ?Pulmonary embolus ([name]) ?Noted to have possible acute on chronic pulmonary embolism on CT.  Also have some evidence of possible pulmonary hypertension.  Patient was previously managed on Eliquis which is transitioned to Lovenox ? ?Currently patient is on apixaban with good toleration.  ? ?Pressure injury of skin ?Lateral penis, posterior right heel, right foot. Not present on admission. ? ?HTN (hypertension) ?Continue blood pressure control with amlodipine, carvedilol, hydralazine, isosorbide and spironolactone.  ? ?HFrEF (heart failure with reduced ejection fraction) ([name]) ?2D echocardiogram-LVEF < 20%,  ?Patient euvolemic, continue medical therapy with carvedilol, isosorbide/ hydralazine and spironolactone ?As needed furosemide.  ? ?Myoclonus ?Seen by neurology.  Continue current AED with Klonopin, Keppra. Per neuro notes, suppressing his myoclonus will not ultimately change the prognosis  ? ?Hypernatremia-resolved as of 06/09/2021 ?Resolved with free water flushes ? ? ? ? ?  ? ?Subjective: Patient tolerating tube feedings and managing trach secretions well.  ? ?Physical Exam: ?Vitals:  ? 06/26/21 0819 06/26/21 1135 06/26/21 1141 06/26/21 1200  ?BP:   (!) 119/94 108/86  ?Pulse: 97 97 94 97  ?Resp: (!) 24 (!) $Remo'26 20 19  'ypRfh$ ?Temp:   98.4 ?F (36.9 ?C)   ?TempSrc:   Oral   ?SpO2: 100% 100% 92% 93%  ?Weight:      ?Height:      ? ?Neurology, eyes closed and not following commands, no tremors or clonus, ?ENT trach in place ?Cardiovascular with S1 and S2 present and rhythmic with no gallops rubs or murmurs ?Respiratory with scattered rhonchi, but no rales or wheezing ?Abdomen with peg tube in place not  distended  ?Data Reviewed: ? ? ? ?Family Communication: no family at the bedside  ? ?Disposition: ?Status is: Inpatient ?Remains inpatient appropriate because: pending placement  ? Planned Discharge Destination: Home ? ? ? ? ?Author: ?Tawni Millers, MD ?06/26/2021 1:57 PM ? ?For on call review [email or web address]  ?

## 2021-06-26 NOTE — Progress Notes (Signed)
?NAME:  Billy Cherry, MRN:  [id number], DOB:  1971-03-03, LOS: 88 ?ADMISSION DATE:  03/30/2021, CONSULTATION DATE:  1/19 ?REFERRING MD:  [place], CHIEF COMPLAINT:  Found down, cardiac arrest  ? ?Brief Pt Description / Synopsis:  ?51 year old male with out-of-hospital V. fib cardiac arrest in setting of respiratory arrest due to suspected angioedema from ACE inhibitor leading to asphyxiation.  Concern on his 12 lead EKG for anterolateral ST elevation so he was taken to the cath lab where his study showed no significant coronary artery disease and an LVEF < 20%  Now with anoxic brain injury. ? ?Pertinent  Medical History  ?Hypertension ? ?Significant Hospital Events: ?Including procedures, antibiotic start and stop dates in addition to other pertinent events   ?1/19 admission, left heart cath without significant CAD.  Concern for anoxic brain injury ?1/20: MRI pending, Neuro following ?1/21: MRI shows areas of hypoxic-ischemic injury bilaterally. Fevered; cultures obtained. ?1/23 severe brain damage ?1/24 evidence of severe brain damage, remains critically ill ?1/25: Plan for PEG today. Neuro exam unchanged.  Does exhibit new eyelid tremor like movements and lip/jaw tremor like vs clonus movement.  Bolused Keppra and increased maintenance dose.  Repeat EEG. CTH: concerning for progression of anoxic injury.  ?1/26: Repeat EEG unchanged and continues to show myoclonic seizures despite addition of Valproic acid yesterday. transfer to [facility] for continuous EEG ?1/28: Continues to have myoclonic sz on EEG ?1/29 No acute issues overnight ?2/3 no meaningful neuro recovery. Diuresed ?2/5 unchanged ?2/6 New fever to 102, WBC 16K - treated with vancomycin for 7d based on cultures.  ?2/11: percutaneous tracheostomy ?2/13 >2/20 on trach collar. No vent.  ?2/20 Trach change to 6 cuffless ?2/27 status unchanged ?3/13 unchanged ?3/20 due for trach exchange given continue secretions (although improved) will stick to 6 cuffless  for exchange  ?06/05/2021 Continued thick  secretions, scant blood tinged 4/3/am ?06/06/2021 CT of the chest with new PE and haziness consistent with pneumonia ? ?Interim History / Subjective:  ?No events. ?Remains poorly responsive. ? ?Objective   ?Blood pressure (!) 136/98, pulse 97, temperature 98.4 ?F (36.9 ?C), temperature source Oral, resp. rate (!) 24, height 5\' 10"  (1.778 m), weight 94 kg, SpO2 100 %. ?   ?FiO2 (%):  [28 %] 28 %  ? ?Intake/Output Summary (Last 24 hours) at 06/26/2021 1130 ?Last data filed at 06/25/2021 2047 ?Gross per 24 hour  ?Intake --  ?Output 700 ml  ?Net -700 ml  ? ? ?Filed Weights  ? 06/22/21 0530 06/23/21 0011 06/26/21 [id number]  ?Weight: 93 kg 94 kg 94 kg  ? ?Examination:  ?General: Chronically ill-appearing male, lying on the bed ?HEENT: [place]/AT, eyes anicteric.  moist mucus membranes ?Neuro: Not following commands, eyes closed ?Chest: Coarse breath sounds, no wheezes or rhonchi ?Heart: Regular rate and rhythm, no murmurs or gallops ?Abdomen: Soft, nontender, nondistended, bowel sounds present ?Skin: No rash ? ? ?CBC/BMP reviewed, serum sodium 134, rest of the chemistry and CBC looks okay ?CXR 4/15 looks okay ? ?Ancillary tests personally reviewed:    ? ?Assessment & Plan:  ?OOH Vfib arrest ?Post arrest anoxic brain injury, severe ?Post arrest myoclonus improved with klonopin ?Post arrest respiratory failure with prolonged mechanical ventilation s/p tracheostomy ?Post arrest cardiomyopathy ?Post arrest AKI, resolved ?HTN- stable on current regimen ?PE- on eliquis ? ?Plan:  ?Continue supportive care ?Continue Klonopin ?Patient is on trach collar  ?Continue eliquis indefinitely as high risk of recurrence ?Okay to change out trach per usual protocol, discussed with RT ?Janeice Robinson  prognosis ? ?Will see weekly, next 5/1 ? ?  ?Jacky Kindle MD ?[facility] ?See Amion for pager ?If no response to pager, please call [phone] until 7pm ?After 7pm, Please call E-link [phone] ? ?

## 2021-06-27 ENCOUNTER — Other Ambulatory Visit (HOSPITAL_COMMUNITY): Payer: Self-pay

## 2021-06-27 MED ORDER — PROSOURCE TF PO LIQD
45.0000 mL | Freq: Two times a day (BID) | ORAL | 0 refills | Status: AC
  Filled 2021-06-27: qty 270, 3d supply, fill #0

## 2021-06-27 MED ORDER — HYDRALAZINE HCL 25 MG PO TABS
25.0000 mg | ORAL_TABLET | Freq: Three times a day (TID) | ORAL | Status: DC
  Administered 2021-06-27 – 2021-07-06 (×20): 25 mg
  Filled 2021-06-27 (×25): qty 1

## 2021-06-27 MED ORDER — JUVEN PO PACK
1.0000 | PACK | Freq: Two times a day (BID) | ORAL | 0 refills | Status: AC
  Filled 2021-06-27: qty 6, 3d supply, fill #0

## 2021-06-27 NOTE — TOC Progression Note (Signed)
Transition of Care (TOC) - Progression Note  ? ? ?Patient Details  ?Name: Lavonta Tillis ?MRN: [id number] ?Date of Birth: 01-May-1970 ? ?Transition of Care (TOC) CM/SW Contact  ?Leone Haven, RN ?Phone Number: ?06/27/2021, 2:12 PM ? ?Clinical Narrative:    ?NCM received call from [place] with [facility], he just spoke with wife, she states she has traffic court tomorrow and will not be able to receive patient tomorrow.  Also she has not gotten the hospital bed from [facility] yet.  Wife would like to plan the dc for April 27th.  MD notified of this information. [name] states he will call Ian Malkin with [employer] to get them to contact patient because she has not heard from them.  This NCM also will contact [employer] regarding the DME.  ? ? ?Expected Discharge Plan: Home w Home Health Services ?Barriers to Discharge: Barriers Resolved ? ?Expected Discharge Plan and Services ?Expected Discharge Plan: Home w Home Health Services ?In-house Referral: Hospice / Palliative Care ?Discharge Planning Services: CM Consult ?Post Acute Care Choice: Long Term Acute Care (LTAC) ?Living arrangements for the past 2 months: Single Family Home ?                ?DME Arranged: N/A ?DME Agency: [facility] ?  ?  ?  ?HH Arranged: Nurse's Aide ?HH Agency: [facility] ?  ?  ?Representative spoke with at [facility] Agency: [name] ? ? ?Social Determinants of Health (SDOH) Interventions ?  ? ?Readmission Risk Interventions ? ?  04/14/2021  ? 11:14 AM  ?Readmission Risk Prevention Plan  ?Transportation Screening Complete  ?HRI or Home Care Consult Complete  ?Social Work Consult for Recovery Care Planning/Counseling Complete  ?Palliative Care Screening Complete  ?Medication Review Oceanographer) Complete  ? ? ?

## 2021-06-27 NOTE — Consult Note (Signed)
[place] Nurse wound follow up ?Patient receiving care in [place] ?Wound type: Right heel stable DTI that is purple blood-filled with hardened surface, measuring 4 cm x 4 cm. Right plantar surface healing nicely, mostly scar tissue, pink at this point measuring 1 cm x 3 cm x 0.1 cm. Heel foam dressing in place and Bilateral Prevalon boots ?Drainage (amount, consistency, odor) None ?Periwound: intact ?Dressing procedure/placement/frequency: ?We will continue current POC for Xeroform over the plantar surface secured with foam dressing.  ? ?Monitor the wound area(s) for worsening of condition such as: ?Signs/symptoms of infection, increase in size, development of or worsening of odor, ?development of pain, or increased pain at the affected locations.   ?Notify the medical team if any of these develop. ? ?Thank you for the consult. [place] nurse will follow weekly.  ?Please re-consult the [place] team if needed. ? ?Cathlean Marseilles. Tamala Julian, MSN, RN, CMSRN, AGCNS, WTA ?Wound Treatment Associate ?Pager [phone]   ?  ?

## 2021-06-27 NOTE — Progress Notes (Signed)
?Progress Note ? ? ?Patient: Billy Cherry [id number] DOB: Jul 07, 1970 DOA: 03/30/2021     89 ?DOS: the patient was seen and examined on 06/27/2021 ?  ?Brief hospital course: ?51 years old male with past medical history of hypertension was brought into the hospital after being found unresponsive by his wife.   ?Admitted to [facility] with out of hospital cardiac arrest ventricular fibrillation, in the setting of respiratory arrest due to suspected angioedema from ace inhibitor leading to asphyxiation.  ? ?1/19 left heart cath without significant CAD.  Concern for anoxic brain injury ?1/21: MRI shows areas of hypoxic-ischemic injury bilaterally. Fevered; cultures obtained. ?1/23 severe brain damage ?1/25: PEG . Neuro exam unchanged.  Does exhibit new eyelid tremor like movements and lip/jaw tremor like vs clonus movement.  Bolused Keppra and increased maintenance dose.  Repeat EEG. CTH: concerning for progression of anoxic injury.  ?1/26: Repeat EEG unchanged and continues to show myoclonic seizures despite addition of Valproic acid yesterday.  ?03/30/2021 Transfer to [name] from [place] for continuous EEG ?1/28: Continues to have myoclonic sz on EEG ?2/3 no meaningful neuro recovery. Diuresed ?2/6 New fever to 102, WBC 16K - treated with vancomycin for 7d based on cultures.  ?2/11: percutaneous tracheostomy ?2/13 >2/20 on trach collar. No vent.  ?2/20 Trach change to 6 cuffless ?2/28: fever ?3/13: eliquis started ?4/3: fever ?06/06/2021 CT of the chest with new PE and haziness consistent with pneumonia ?4/5-4/9: responding well to PRN lasix ?4/11 fever again after completion of abx- work up in progress ?4/13 continues to have low-grade temps cultures from 4/11 no growth to date-suspect silent/occult aspiration-heparin changed back to Eliquis 4/13 ?4/14: Work-up negative with no growth to date but red menance Tmax elevated 99.4 and respiratory rate up-x-ray did not show  ?       any pneumonia but probably silently  aspirating ? ?04/24 patient has been clinically stable, tolerating tube feedings and managing trach secretions, no increase in 02 requirements. ?Continue not interactive and poor prognosis.  ?04/25 no clinical sings of infection.  ? ?Plan to discharge home with home health services on 06/29/21.  ? ?Assessment and Plan: ?* Cardiac arrest [facility]) ?Postarrest anoxic encephalopathy ?Postarrest respiratory failure s/p TC ?S/P PEG tube placement ?Postarrest myoclonus: ?2D echocardiogram showed reduced LV function at <20%.  Now status post trach and PEG.   ?Remains obtunded with anoxic encephalopathy-on Keppra, for myoclonus and can increase Klonopin for breakthrough myoclonus.   ?At this time, plan is home with home health ?Patient's wife is trying to arrange adequate care at home ?He will need around-the-clock care.  ?Enteral tube feeding trach kit and supplies to be arranged.  ? ?Patient tolerating well tracheostomy. ?Currently with compensated systolic heart failure.  ? ?Anoxic encephalopathy ([place]) ?Continue supportive care.   ?Patient currently keeps his eyes open, but does not answer questions or follow commands.  ?Poor prognosis.  ?Patient not interactive, not following commands.  ? ?Acute respiratory failure ([place]) ?Status post trach collar.  Continue supportive care ?Oxygenation is 99% on 5 L per trach collar with Fi02 28%. ?Managing secretions well.  ?Patient has a cuff less trach #6, continue care per protocol.  ? ?RLL pneumonia ?Previously had completed vancomycin for MSSA and [name] in early march.  Received more than 7-day course of IV Fortaz. ?On 4/3, CT chest was ordered that indicated an area of alveolar infiltration that may represent pneumonia.  Tracheal aspirate has been sent for culture and is significant for normal respiratory flora.  Concerning for aspiration. ?-s/p Unasyn x 7 days ? ?Patient has remained afebrile.  ?Continue aspiration precautions.  ?Currently off antibiotic therapy.   ? ?DVT (deep venous thrombosis) ([name]) ?Duplex ultrasound of lower extremity on 05/15/2021 showed age indeterminate deep vein thrombosis involving the left femoral vein, left proximal profunda vein, left posterior tibial veins, left popliteal vein, and left peroneal veins. Initially on Eliquis then changed to Lovenox ? ?Currently continue anticoagulation with apixaban.  ? ? ?Dysphagia ?Continue PEG tube feeding.  Tolerating but high risk of aspiration ?-Aspiration precautions ? ?Fever ?Patient has remained now afebrile, ?Continue to follow temperature curve.  ?Continue to hold on antibiotics.  ? ?Pulmonary embolus ([name]) ?Noted to have possible acute on chronic pulmonary embolism on CT.  Also have some evidence of possible pulmonary hypertension.  Patient was previously managed on Eliquis which is transitioned to Lovenox ? ?Currently patient is on apixaban with good toleration.  ? ?Pressure injury of skin ?Lateral penis, posterior right heel, right foot. Not present on admission. ? ?Pressure Injury 05/25/21 Heel Posterior;Right Deep Tissue Pressure Injury - Purple or maroon localized area of discolored intact skin or blood-filled blister due to damage of underlying soft tissue from pressure and/or shear. (Active)  ?05/25/21 0800  ?Location: Heel  ?Location Orientation: Posterior;Right  ?Staging: Deep Tissue Pressure Injury - Purple or maroon localized area of discolored intact skin or blood-filled blister due to damage of underlying soft tissue from pressure and/or shear.  ?Wound Description (Comments):   ?Present on Admission: No  ?Dressing Type Foam - Lift dressing to assess site every shift 06/27/21 0820  ?   ?Pressure Injury 05/25/21 Foot Right Stage 2 -  Partial thickness loss of dermis presenting as a shallow open injury with a red, pink wound bed without slough. (Active)  ?05/25/21 0800  ?Location: Foot  ?Location Orientation: Right  ?Staging: Stage 2 -  Partial thickness loss of dermis presenting as a shallow  open injury with a red, pink wound bed without slough.  ?Wound Description (Comments):   ?Present on Admission: No  ?Dressing Type Foam - Lift dressing to assess site every shift 06/27/21 0820  ? ? ?HTN (hypertension) ?Continue blood pressure control with amlodipine, carvedilol, hydralazine, isosorbide and spironolactone.  ?Today blood pressure systolic down to 98, will decrease hydralazine to 25 mg and will add holding parameters.  ? ?HFrEF (heart failure with reduced ejection fraction) ([name]) ?2D echocardiogram-LVEF < 20%,  ?Patient euvolemic, continue medical therapy with carvedilol, isosorbide/ hydralazine and spironolactone ?As needed furosemide.  ? ?Myoclonus ?Seen by neurology.  Continue current AED with Klonopin, Keppra. Per neuro notes, suppressing his myoclonus will not ultimately change the prognosis  ? ?Hypernatremia-resolved as of 06/09/2021 ?Resolved with free water flushes ? ? ? ? ?  ? ?Subjective: Patient with no significant change in his conditioned, managing trach secretions well and tolerating tube feedings.  ? ?Physical Exam: ?Vitals:  ? 06/27/21 1024 06/27/21 1105 06/27/21 1200 06/27/21 1415  ?BP: (!) 118/97 (!) 118/97 (!) 131/96 98/70  ?Pulse: 90 93 87 91  ?Resp: 17 18 18 17   ?Temp: 98.3 ?F (36.8 ?C)     ?TempSrc: Oral     ?SpO2: 100% 99% 100% 97%  ?Weight:      ?Height:      ? ?Neurology patient opens eyes spontaneously but not tracks or follows commands ?Trach in place ?Cardiovascular with S1 and S2 present and rhythmic ?Respiratory with scattered rhonchi bilaterally ?Abdomen with peg tube in place, not distended ?No lower extremity edema  ?  Data Reviewed: ? ? ? ?Family Communication: I spoke over the phone with the patient's wife about patient's  condition, plan of care, prognosis and all questions were addressed.  ? ?Disposition: ?Status is: Inpatient ?Remains inpatient appropriate because: plan to discharge home on 04/27 ? Planned Discharge Destination: Home ? ? ? ? ? ?Author: ?Tawni Millers, MD ?06/27/2021 3:29 PM ? ?For on call review [email or web address].  ?

## 2021-06-27 NOTE — TOC Progression Note (Addendum)
Transition of Care (TOC) - Progression Note  ? ? ?Patient Details  ?Name: Billy Cherry ?MRN: [id number] ?Date of Birth: [birth year] ? ?Transition of Care (TOC) CM/SW Contact  ?Leone Haven, RN ?Phone Number: ?06/27/2021, 3:02 PM ? ?Clinical Narrative:    ?Patient will need  ?Ambu Bag- which is at the bedside. ?Smaller trach size which is at the bedside. ?3 days worth of feeding supplies Osmolite and Prosource. -  Main pharmacy  at [phone]. Will bring up on Thursday.  ?3 days worth of trach supply kits- already in box on the unit.  ?Hospital bed- [facility] to contact wife to coordinate delivery. ?Oxygen- [facility] to coordinate with wife. ?Suction - in room at the bedside.  ? ? ?Expected Discharge Plan: Home w Home Health Services ?Barriers to Discharge: Barriers Resolved ? ?Expected Discharge Plan and Services ?Expected Discharge Plan: Home w Home Health Services ?In-house Referral: Hospice / Palliative Care ?Discharge Planning Services: CM Consult ?Post Acute Care Choice: Long Term Acute Care (LTAC) ?Living arrangements for the past 2 months: Single Family Home ?                ?DME Arranged: N/A ?DME Agency: [facility] ?  ?  ?  ?HH Arranged: Nurse's Aide ?HH Agency: [facility] ?  ?  ?Representative spoke with at [facility] Agency: [name] ? ? ?Social Determinants of Health (SDOH) Interventions ?  ? ?Readmission Risk Interventions ? ?  04/14/2021  ? 11:14 AM  ?Readmission Risk Prevention Plan  ?Transportation Screening Complete  ?HRI or Home Care Consult Complete  ?Social Work Consult for Recovery Care Planning/Counseling Complete  ?Palliative Care Screening Complete  ?Medication Review Oceanographer) Complete  ? ? ?

## 2021-06-27 NOTE — Progress Notes (Signed)
Nutrition Follow-up ? ?DOCUMENTATION CODES:  ? ?Not applicable ? ?INTERVENTION:  ? ?Tube Feeding via PEG:  ?1.5 cartons of Osmolite 1.5 - QID (355 mL each bolus) ?45 mL ProSource TF - BID ?300 mL free water q4h  ?Provides 2210 kcal, 111 gm PRO, and 1086 mL free water (2879 mL total free water) daily.  ?  ?Continue Juven BID per tube, each packet provides 80 calories, 8 grams of carbohydrate, 2.5  grams of protein  to promote wound healing ?  ?NUTRITION DIAGNOSIS:  ? ?Inadequate oral intake related to inability to eat as evidenced by NPO status; ongoing ? ?GOAL:  ? ?Patient will meet greater than or equal to 90% of their needs; met with TF ? ?MONITOR:  ? ?Labs, Weight trends, TF tolerance, Skin, I & O's ? ?REASON FOR ASSESSMENT:  ? ?Ventilator, Consult ?Enteral/tube feeding initiation and management ? ?ASSESSMENT:  ? ?51 year old male who originally presented to [facility] on 1/19 after out-of-hospital cardiac arrest due to suspected angioedema from ACE inhibitor leading to asphyxiation. PMH of HTN, HLD. ? ?2/11 - tracheostomy placed ?2/22 - PEG placed  ? ?Pt continues on NPO status with bolus tube feeds via PEG. RN reports pt has been tolerating his tube feeds well with no difficulties. Recommend continuation of current regimen. Labs and medications reviewed.  ? ?Diet Order:   ?Diet Order   ? ? None  ? ?  ? ? ?EDUCATION NEEDS:  ? ?Not appropriate for education at this time ? ?Skin:  Skin Assessment: Skin Integrity Issues: ?Skin Integrity Issues:: Stage II, DTI ?DTI: heel ?Stage II: R foot ?Stage III: N/A ?Other: MASD Buttocks ? ?Last BM:  4/20 ? ?Height:  ? ?Ht Readings from Last 1 Encounters:  ?04/18/21 _0  (1.778 m)  ? ? ?Weight:  ? ?Wt Readings from Last 1 Encounters:  ?06/27/21 94 kg  ? ? ?Ideal Body Weight:  75.5 kg ? ?BMI:  Body mass index is 29.73 kg/m?. ? ?Estimated Nutritional Needs:  ? ?Kcal:  2200-2400 ? ?Protein:  110-125 grams ? ?Fluid:  >/= 2.2 L ? ?Corrin Parker, MS, RD, LDN ?RD pager number/after  hours weekend pager number on Amion. ? ?

## 2021-06-28 ENCOUNTER — Other Ambulatory Visit (HOSPITAL_COMMUNITY): Payer: Self-pay

## 2021-06-28 MED ORDER — HYDRALAZINE HCL 25 MG PO TABS
25.0000 mg | ORAL_TABLET | Freq: Three times a day (TID) | ORAL | 0 refills | Status: DC
Start: 2021-06-28 — End: 2021-08-07
  Filled 2021-06-28: qty 90, 30d supply, fill #0

## 2021-06-28 MED ORDER — SCOPOLAMINE 1 MG/3DAYS TD PT72
1.0000 | MEDICATED_PATCH | TRANSDERMAL | 0 refills | Status: DC
Start: 2021-06-29 — End: 2021-11-14
  Filled 2021-06-28 – 2021-10-04 (×2): qty 8, 24d supply, fill #0

## 2021-06-28 MED ORDER — DOXAZOSIN MESYLATE 2 MG PO TABS
2.0000 mg | ORAL_TABLET | Freq: Every day | ORAL | 0 refills | Status: DC
Start: 2021-06-29 — End: 2021-11-07
  Filled 2021-06-28 – 2021-10-04 (×2): qty 30, 30d supply, fill #0

## 2021-06-28 MED ORDER — NUTRISOURCE FIBER PO PACK
1.0000 | PACK | Freq: Two times a day (BID) | ORAL | 0 refills | Status: DC
Start: 2021-06-28 — End: 2021-10-19
  Filled 2021-06-28: qty 60, 30d supply, fill #0

## 2021-06-28 MED ORDER — CARVEDILOL 25 MG PO TABS
25.0000 mg | ORAL_TABLET | Freq: Two times a day (BID) | ORAL | 0 refills | Status: DC
  Filled 2021-06-28 – 2021-10-04 (×2): qty 60, 30d supply, fill #0

## 2021-06-28 MED ORDER — SPIRONOLACTONE 25 MG PO TABS
25.0000 mg | ORAL_TABLET | Freq: Every day | ORAL | 0 refills | Status: DC
  Filled 2021-06-28: qty 30, 30d supply, fill #0

## 2021-06-28 MED ORDER — VALPROIC ACID 250 MG/5ML PO SOLN
750.0000 mg | Freq: Three times a day (TID) | ORAL | 1 refills | Status: DC
Start: 2021-06-28 — End: 2021-10-26
  Filled 2021-06-28: qty 1200, 27d supply, fill #0
  Filled 2021-10-04: qty 946, 21d supply, fill #0

## 2021-06-28 MED ORDER — FREE WATER: 300.0000 mL | Status: DC

## 2021-06-28 MED ORDER — ISOSORBIDE DINITRATE 20 MG PO TABS
20.0000 mg | ORAL_TABLET | Freq: Three times a day (TID) | ORAL | 0 refills | Status: DC
Start: 2021-06-28 — End: 2021-08-07
  Filled 2021-06-28: qty 90, 30d supply, fill #0

## 2021-06-28 MED ORDER — POLYETHYLENE GLYCOL 3350 17 GM/SCOOP PO POWD
17.0000 g | Freq: Every day | ORAL | 0 refills | Status: DC | PRN
  Filled 2021-06-28 – 2021-10-04 (×2): qty 238, 14d supply, fill #0

## 2021-06-28 MED ORDER — PANTOPRAZOLE SODIUM 40 MG PO PACK
40.0000 mg | PACK | Freq: Every day | ORAL | 0 refills | Status: DC
  Filled 2021-06-28: qty 30, 30d supply, fill #0

## 2021-06-28 MED ORDER — CLONAZEPAM 0.5 MG PO TABS
0.5000 mg | ORAL_TABLET | Freq: Two times a day (BID) | ORAL | 0 refills | Status: DC
  Filled 2021-06-28: qty 60, 30d supply, fill #0

## 2021-06-28 MED ORDER — LOPERAMIDE HCL 1 MG/7.5ML PO LIQD
2.0000 mg | Freq: Every day | ORAL | 0 refills | Status: DC | PRN
Start: 2021-06-28 — End: 2021-11-14
  Filled 2021-06-28 – 2021-10-04 (×2): qty 120, 8d supply, fill #0

## 2021-06-28 MED ORDER — OMEPRAZOLE 40 MG PO CPDR
40.0000 mg | DELAYED_RELEASE_CAPSULE | Freq: Every day | ORAL | 1 refills | Status: DC
  Filled 2021-06-28: qty 30, 30d supply, fill #0

## 2021-06-28 MED ORDER — APIXABAN 5 MG PO TABS
5.0000 mg | ORAL_TABLET | Freq: Two times a day (BID) | ORAL | 0 refills | Status: DC
  Filled 2021-06-28 – 2021-10-04 (×2): qty 60, 30d supply, fill #0

## 2021-06-28 MED ORDER — LEVETIRACETAM 100 MG/ML PO SOLN
750.0000 mg | Freq: Two times a day (BID) | ORAL | 0 refills | Status: DC
  Filled 2021-06-28 – 2021-10-04 (×2): qty 473, 30d supply, fill #0

## 2021-06-28 NOTE — Plan of Care (Signed)
  Problem: Clinical Measurements: Goal: Will remain free from infection Outcome: Progressing   Problem: Elimination: Goal: Will not experience complications related to bowel motility Outcome: Progressing   Problem: Elimination: Goal: Will not experience complications related to urinary retention Outcome: Progressing   Problem: Pain Managment: Goal: General experience of comfort will improve Outcome: Progressing   

## 2021-06-28 NOTE — TOC Benefit Eligibility Note (Signed)
Patient Advocate Encounter ?  ?Received notification that prior authorization for Pantoprazole Sodium 40MG  packets is required. ?  ?PA submitted on 06/25/2021 ?Key [id number] ?Status is pending ?   ? ? ? ?06/27/2021, CPhT ?Pharmacy Patient Advocate Specialist ?[facility] Patient Advocate Team ?Direct Number: [phone]  Fax: [phone]  ?

## 2021-06-28 NOTE — Progress Notes (Signed)
Patient seen today by trach team for consult.  No education is needed at this time.  All necessary equipment is at beside.   Will continue to follow for progression.  

## 2021-06-28 NOTE — TOC Benefit Eligibility Note (Signed)
Patient Advocate Encounter ? ?Received notification that the request for prior authorization for Pantoprazole Sodium 40MG  packets has been denied due to Have to have tried or cannot use at least two drugs from the following: ?(a) Rabeprazole capsule. ?(b) Esomeprazole magnesium or Nexium pack. ?(c) First-Lansoprazole*. ?(d) First-Omeprazole or omeprazole + Syrspend Sf Al[place]* ?Patient has not.   ?  ?  ? ? , CPhT ?Pharmacy Patient Advocate Specialist ?[facility] Pharm[facility] Patient Advocate Team ?Direct Number: [phone]  Fax: [phone]  ?

## 2021-06-28 NOTE — Progress Notes (Signed)
?PROGRESS NOTE ? ? ? ?Billy Cherry  OYD:[id number] DOB: 1970/10/16 DOA: 03/30/2021 ?PCP: Patient, No Pcp Per (Inactive)  ? ?Brief Narrative:  ?51 years old male with past medical history of hypertension was brought into the hospital after being found unresponsive by his wife.  Out of hospital V-fib arrest, received CPR, 1 shock with defibrillator, 2 doses of epinephrine with ROSC, EKG noted anterolateral STEMI ?1/19 admission, left heart cath without significant CAD.  Concern for anoxic brain injury ?1/21: MRI shows areas of hypoxic-ischemic injury bilaterally.  ?1/23 severe brain damage ?1/25: PEG . Neuro exam unchanged.  Does exhibit new eyelid tremor like movements and lip/jaw tremor like vs clonus movement.  Bolused Keppra and increased maintenance dose.  Repeat EEG. CTH: concerning for progression of anoxic injury.  ?1/26: Repeat EEG unchanged and continues to show myoclonic seizures despite addition of Valproic acid. [name] for continuous EEG ?1/28: Continues to have myoclonic sz on EEG ?2/3 no meaningful neuro recovery.  ?2/6 New fever to 102, WBC 16K - treated with vancomycin for 7d based on cultures.  ?2/11: percutaneous tracheostomy ?2/13 >2/20 on trach collar. No vent.  ?2/20 Trach change to 6 cuffless ?2/28: fever ?3/13: eliquis started ?4/3: fever ?06/06/2021 CT of the chest with new PE and haziness consistent with pneumonia ?4/5-4/9: responding well to PRN lasix ?4/11 fever again after completion of abx- work up in progress ?4/13 continues to have low-grade temps cultures from 4/11 no growth to date-suspect silent/occult aspiration-heparin changed back to Eliquis 4/13 ?4/14: Work-up negative with no growth to date but red menance Tmax elevated 99.4 and respiratory rate up-x-ray did not show any pneumonia but probably silently aspirating ?Had been awaiting [place] Medicaid for home nursing care ? ?  ?Assessment and Plan: ? ?Cardiac arrest [facility]) ?Postarrest anoxic  encephalopathy ?Postarrest respiratory failure s/p tracheostomy ?S/P PEG tube placement ?Postarrest myoclonus: ?2D echocardiogram showed reduced LV function at <20%.  Now status post trach and PEG.   ?Remains obtunded with anoxic encephalopathy-on Keppra, for myoclonus and can increase Klonopin for breakthrough myoclonus.   ?At this time, plan is home with home health ?-Wife has reportedly completed education for trach and PEG tube ?-Enteral tube feeding trach kit and supplies to be arranged. PCCM managing tracheostomy. ?-Home equipment being delivered today for discharge tomorrow ?--Seen by palliative care multiple times this admission, patient's wife continues to want full code and full scope of treatment ? 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   ?Patient currently keeps his eyes open, but does not answer questions or follow commands ? ?Acute respiratory failure ([place]) ?Status post trach collar.  Continue supportive care ? ?RLL pneumonia ?Previously had completed vancomycin for MSSA and [name] in early march.  Received more than 7-day course of IV Fortaz. ?On 4/3, CT chest was ordered that indicated an area of alveolar infiltration that may represent pneumonia.  Tracheal aspirate has been sent for culture and is significant for normal respiratory flora. Concerning for aspiration. ?-s/p Unasyn x 7 days ? ?PE/DVT (deep venous thrombosis) ([place]) ?-CTA chest 4/4 noted bilateral PE ?-Duplex ultrasound of lower extremity on 05/15/2021 showed age indeterminate deep vein thrombosis involving the left femoral vein, left proximal profunda vein, left posterior tibial veins, left popliteal vein, and left peroneal veins.  ?-Was on Eliquis initially, briefly treated with Lovenox and then back on Eliquis now ?-Continue Eliquis ?-repeated duplex (due to fever) and clot has not propagated.   ? ?Dysphagia ?Continue PEG tube feeding.  Tolerating but high risk of aspiration ?-Aspiration  precautions ? ?Recurrent fever ?-Could be tracheobronchitis, silent aspiration vs central fever ?-Temp of 100.4 few days ago, this morning temp of 100 ?-Will reculture if fever recurs ?-Continue pulmonary toilet, WBC count is stable ?-Secretions unchanged, continue pulmonary toilet ? ?Pressure injury of skin ?Lateral penis, posterior right heel, right foot. Not present on admission. ? ?HTN (hypertension) ?-Continue Coreg, Cardura, hydralazine and PRN Labetalol,  isosorbide dinitrate 30 mg TID ? ?HFrEF (heart failure with reduced ejection fraction) ([place]) ?2D echocardiogram-LVEF < 20%,  ?-Continue coreg, imdur, spironolactone ?-Has not required any diuretics in the last 2 months, overall prognosis is very poor ? ?Myoclonus ?Seen by neurology.  Continue current AED with Klonopin, Keppra. Per neuro notes, suppressing his myoclonus will not ultimately change the prognosis  ? ?Hypernatremia-resolved as of 06/09/2021 ? ?Hyperkalemia ?-resolved ? ?Overall poor prognosis ?Goals of care discussions ?-Remains unresponsive with trach, PEG, pressure wounds now ?-Seen by palliative care multiple times this admission, patient's wife continues to want full code and full scope of treatment ? ? ?DVT prophylaxis: apixaban (ELIQUIS) tablet 5 mg  ?  Code Status: Full Code ?Family Communication: No family at bedside,  ? ?Disposition Plan: Home with home health services once arrangements completed by [facility] ?  ? ?Consultants:  ?PCCM ?Neurology ?Palliative care ?ID ?IR ?GI ?cards ? ? ?Subjective: ?-Unchanged, no events overnight, Tmax 99.9 today ? ?Objective: ?Vitals:  ? 06/28/21 0754 06/28/21 0847 06/28/21 0904 06/28/21 1129  ?BP:   126/84 107/73  ?Pulse: 95 92  95  ?Resp: (!) _0 ?Temp:    98.4 ?F (36.9 ?C)  ?TempSrc:    Oral  ?SpO2: 98% 97%  100%  ?Weight:      ?Height:      ? ? ?Intake/Output Summary (Last 24 hours) at 06/28/2021 1232 ?Last data filed at 06/28/2021 0300 ?Gross per 24 hour  ?Intake 1515 ml  ?Output 1100 ml  ?Net 415 ml   ? ?Filed Weights  ? 06/26/21 0605 06/27/21 0426 06/28/21 0415  ?Weight: 94 kg 94 kg 91 kg  ? ? ?Examination: ? ?Gen: Obese chronically ill male laying in bed, eyes open, does not follow commands ?HEENT: Tracheostomy, trach collar with thick white secretions ?CVS: S1-S2, regular rhythm, tachycardic ?Lungs: Conducted upper airway sounds ?Abdomen: Soft, nontender, PEG tube noted ?Extremities: No edema, dressing over pressure wounds  ? ?Data Reviewed: I have personally reviewed following labs and imaging studies ? ?CBC: ?Recent Labs  ?Lab 06/22/21 ?9937 06/24/21 ?0336  ?WBC 10.3 9.1  ?NEUTROABS 7.1  --   ?HGB 10.9* 12.4*  ?HCT 33.1* 38.7*  ?MCV 96.2 96.3  ?PLT 328 309  ? ?Basic Metabolic Panel: ?Recent Labs  ?Lab 06/22/21 ?1696 06/24/21 ?0336  ?NA 132* 134*  ?K 4.4 4.5  ?CL 97* 96*  ?CO2 27 29  ?GLUCOSE 107* 116*  ?BUN 31* 37*  ?CREATININE 0.68 0.63  ?CALCIUM 8.7* 9.2  ? ?GFR: ?Estimated Creatinine Clearance: 125.3 mL/min (by C-G formula based on SCr of 0.63 mg/dL). ?Liver Function Tests: ?Recent Labs  ?Lab 06/22/21 ?7893  ?AST 16  ?ALT 12  ?ALKPHOS 60  ?BILITOT 0.4  ?PROT 6.4*  ?ALBUMIN 2.1*  ? ? ?No results for input(s): LIPASE, AMYLASE in the last 168 hours. ?No results for input(s): AMMONIA in the last 168 hours. ?Coagulation Profile: ?No results for input(s): INR, PROTIME in the last 168 hours. ?Cardiac Enzymes: ?No results for input(s): CKTOTAL, CKMB, CKMBINDEX, TROPONINI in the last 168 hours. ?  BNP (last 3 results) ?No results for input(s): PROBNP in the last 8760 hours. ?HbA1C: ?No results for input(s): HGBA1C in the last 72 hours. ?CBG: ?No results for input(s): GLUCAP in the last 168 hours. ?Lipid Profile: ?No results for input(s): CHOL, HDL, LDLCALC, TRIG, CHOLHDL, LDLDIRECT in the last 72 hours. ?Thyroid Function Tests: ?No results for input(s): TSH, T4TOTAL, FREET4, T3FREE, THYROIDAB in the last 72 hours. ?Anemia Panel: ?No results for input(s): VITAMINB12, FOLATE, FERRITIN, TIBC, IRON, RETICCTPCT in the  last 72 hours. ?Sepsis Labs: ?No results for input(s): PROCALCITON, LATICACIDVEN in the last 168 hours. ? ? ?No results found for this or any previous visit (from the past 240 hour(s)). ?  ? ? ? ? ? ?Radiology Studies: ?No results

## 2021-06-28 NOTE — TOC Progression Note (Addendum)
Transition of Care (TOC) - Progression Note  ? ? ?Patient Details  ?Name: Billy Cherry ?MRN: [id number] ?Date of Birth: 09-19-1970 ? ?Transition of Care (TOC) CM/SW Contact  ?Leone Haven, RN ?Phone Number: ?06/28/2021, 3:19 PM ? ?Clinical Narrative:    ?Per [name] with [facility], they are not ready for a dc date tomorrow, the Nurse will not be available, he will call me with the new dc start date. Wife states she will be here in the am to work with staff RN with bolus tube feeding and to get checked off by respiratory for trach care and pick up the box with the 3 day supplies worth of trach kits, she states the hospital bed has been delivered, the oxygen has been delivered.   ? ?Patient will need  ?Ambu Bag- which is at the bedside. ? ?Smaller trach size which is at the bedside. ? ?3 days worth of feeding supplies Osmolite and Prosource. -  Main pharmacy  at [phone]. Will bring up on Thursday or on new dc date. ? ? ?3 days worth of trach supply kits- already in box on the unit. - wife to pick up on Thursday 4/27. ? ?Hospital bed- [facility] has delivered bed to house already. ? ?Oxygen- [facility] has delivered to house already.  ? ?Suction - in room at the bedside. ? ? ?Expected Discharge Plan: Home w Home Health Services ?Barriers to Discharge: Barriers Resolved ? ?Expected Discharge Plan and Services ?Expected Discharge Plan: Home w Home Health Services ?In-house Referral: Hospice / Palliative Care ?Discharge Planning Services: CM Consult ?Post Acute Care Choice: Long Term Acute Care (LTAC) ?Living arrangements for the past 2 months: Single Family Home ?                ?DME Arranged: N/A ?DME Agency: [facility] ?  ?  ?  ?HH Arranged: Nurse's Aide ?HH Agency: [facility] ?  ?  ?Representative spoke with at [facility] Agency: [name] ? ? ?Social Determinants of Health (SDOH) Interventions ?  ? ?Readmission Risk Interventions ? ?  04/14/2021  ? 11:14 AM  ?Readmission Risk Prevention Plan  ?Transportation Screening  Complete  ?HRI or Home Care Consult Complete  ?Social Work Consult for Recovery Care Planning/Counseling Complete  ?Palliative Care Screening Complete  ?Medication Review Oceanographer) Complete  ? ? ?

## 2021-06-29 LAB — BASIC METABOLIC PANEL
Anion gap: 8 (ref 5–15)
BUN: 33 mg/dL — ABNORMAL HIGH (ref 6–20)
CO2: 29 mmol/L (ref 22–32)
Calcium: 8.9 mg/dL (ref 8.9–10.3)
Chloride: 100 mmol/L (ref 98–111)
Creatinine, Ser: 0.79 mg/dL (ref 0.61–1.24)
GFR, Estimated: >60 mL/min (ref >60)
Glucose, Bld: 126 mg/dL — ABNORMAL HIGH (ref 70–99)
Potassium: 4.3 mmol/L (ref 3.5–5.1)
Sodium: 137 mmol/L (ref 135–145)

## 2021-06-29 LAB — CBC
HCT: 33.7 % — ABNORMAL LOW (ref 39.0–52.0)
Hemoglobin: 10.8 g/dL — ABNORMAL LOW (ref 13.0–17.0)
MCH: 31.1 pg (ref 26.0–34.0)
MCHC: 32 g/dL (ref 30.0–36.0)
MCV: 97.1 fL (ref 80.0–100.0)
Platelets: 282 10*3/uL (ref 150–400)
RBC: 3.47 MIL/uL — ABNORMAL LOW (ref 4.22–5.81)
RDW: 13.6 % (ref 11.5–15.5)
WBC: 7.7 10*3/uL (ref 4.0–10.5)
nRBC: 0 % (ref 0.0–0.2)

## 2021-06-29 NOTE — Progress Notes (Signed)
Per discussion w/ nurse and trach team it was reported that patients wife was supposed to be here today for trach education.  Currently no family present to provide trach education.  ?

## 2021-06-29 NOTE — TOC Progression Note (Signed)
Transition of Care (TOC) - Progression Note  ? ? ?Patient Details  ?Name: Billy Cherry ?MRN: [id number] ?Date of Birth: Jul 07, 1970 ? ?Transition of Care (TOC) CM/SW Contact  ?Zenon Mayo, RN ?Phone Number: ?06/29/2021, 4:37 PM ? ?Clinical Narrative:    ?NCM received call today from [place] with [place].  He states he spoke with his office today and they can not provide a Nurse for 12 hours , but they can do 8 hours.  He states he informed patient wife of this information and she is supposed to let him know if this is something she can work with.  He states wife asked for a schedule to be sent to her of when the Nurse would be available.  Wife states her work schedule is not flexible, she has patient's she has to see.  Wife was supposed to come to hospital today to do some training with Nurse for and to be checked off by respiratory but she has not gotten here yet. She may still show up later today.  Per Herbie [place] with [facility] if she goes with the 8 hr shift for his Nurses then the new dc date will probably be 5/4.  TOC will continue to follow for dc needs.  ? ? ?Expected Discharge Plan: [place] ?Barriers to Discharge: Barriers Resolved ? ?Expected Discharge Plan and Services ?Expected Discharge Plan: [place] ?In-house Referral: Hospice / Palliative Care ?Discharge Planning Services: CM Consult ?Post Acute Care Choice: Long Term Acute Care (LTAC) ?Living arrangements for the past 2 months: [place] ?                ?DME Arranged: N/A ?DME Agency: [facility] ?  ?  ?  ?HH Arranged: Nurse's Aide ?[place] Agency: [place] ?  ?  ?Representative spoke with at [place]: [name] ? ? ?Social Determinants of Health (SDOH) Interventions ?  ? ?Readmission Risk Interventions ? ?  04/14/2021  ? 11:14 AM  ?Readmission Risk Prevention Plan  ?Transportation Screening Complete  ?[place] or Home Care Consult Complete  ?Social Work Consult for [place] Planning/Counseling  Complete  ?Palliative Care Screening Complete  ?Medication Review Press photographer) Complete  ? ? ?

## 2021-06-29 NOTE — Progress Notes (Signed)
Currently there is no family at bedside to provide trach education to.   ?

## 2021-06-29 NOTE — Progress Notes (Signed)
Patient seen and examined, no changes from my note yesterday ?-Awaiting home health availability prior to discharge home with new trach ? ?Zannie Cove, MD ?

## 2021-06-30 MED ORDER — DEXTROSE 5 % IV SOLN: INTRAVENOUS | Status: AC

## 2021-06-30 NOTE — Progress Notes (Signed)
Patient vomited copious amounts after providing scheduled 2000 free water and juven supplement. MD notified. Free water temporarily replaced with D5x112mL, boluses continuing as ordered. Plan of care otherwise unaltered. ?

## 2021-06-30 NOTE — TOC Progression Note (Signed)
Transition of Care (TOC) - Progression Note  ? ? ?Patient Details  ?Name: Billy Cherry ?MRN: [id number] ?Date of Birth: [birth year] ? ?Transition of Care (TOC) CM/SW Contact  ?Leone Haven, RN ?Phone Number: ?06/30/2021, 3:57 PM ? ?Clinical Narrative:    ?NCM spoke with patient wife, [name], informed her that I spoke with [name] with [facility] and he has informed me that he spoke to her about they are not able to have a Nurse for 12 hrs but can have a Nurse for 8hrs, would this work for her or would she like for this NCM to try another Agency like [facility] she states yes to try [facility] if they could supply a Nurse for 12 hr shift and to fax info to them.   NCM contacted [name] with [facility] he states they could supply a Nurse for 12 hrs, and he thinks the [employer] office will be following it since patient lives in [place].  His phone is [phone] and fax is [phone].  Awaiting to hear back from [facility]. ? ?Expected Discharge Plan: Home w Home Health Services ?Barriers to Discharge: Barriers Resolved ? ?Expected Discharge Plan and Services ?Expected Discharge Plan: Home w Home Health Services ?In-house Referral: Hospice / Palliative Care ?Discharge Planning Services: CM Consult ?Post Acute Care Choice: Long Term Acute Care (LTAC) ?Living arrangements for the past 2 months: Single Family Home ?                ?DME Arranged: N/A ?DME Agency: [facility] ?  ?  ?  ?HH Arranged: Nurse's Aide ?HH Agency: [facility] ?  ?  ?Representative spoke with at [facility] Agency: [name] ? ? ?Social Determinants of Health (SDOH) Interventions ?  ? ?Readmission Risk Interventions ? ?  04/14/2021  ? 11:14 AM  ?Readmission Risk Prevention Plan  ?Transportation Screening Complete  ?HRI or Home Care Consult Complete  ?Social Work Consult for Recovery Care Planning/Counseling Complete  ?Palliative Care Screening Complete  ?Medication Review Oceanographer) Complete  ? ? ?

## 2021-06-30 NOTE — Progress Notes (Signed)
Pt seen and examined, no changes, had an episode of vomiting last night after tube feeds, free water, no further episodes noted ?-Continue current care ?-Attempted x2 to reach wife again to reiterate, trach education, trach team has been following without much success ?-Per TOC home health nursing care start date to be determined ? ?Domenic Polite, MD ?

## 2021-06-30 NOTE — Progress Notes (Signed)
Patient did not have any visitors (wife did not come) from 7a-7p to provide education regarding trach or PEG feedings/medication.  ?

## 2021-06-30 NOTE — Progress Notes (Signed)
Spoke with patient's spouse [employer]. She has had hands on education with respiratory care. She was able to explain trach care over the phone during our conversation. Both K.Damian and T.Ellis from night shift have done trach education with her and have documented it in the progress notes. Ms. [name] works 10 hour shifts and is unable to come in during the day shift most days. She said that she needs education on the feeding of the patient and the medications. Respiratory will continue to follow progress and Ms. [name] will notify staff when she comes for the next visit for any follow-up education that she might need. ?

## 2021-07-01 NOTE — Progress Notes (Signed)
?PROGRESS NOTE ? ? ? ?Billy Cherry  ZOX:[id number] DOB: 07/25/1970 DOA: 03/30/2021 ?PCP: Patient, No Pcp Per (Inactive)  ? ?Brief Narrative:  ?50 years old male with past medical history of hypertension was brought into the hospital after being found unresponsive by his wife.  Out of hospital V-fib arrest, received CPR, 1 shock with defibrillator, 2 doses of epinephrine with ROSC, EKG noted anterolateral STEMI ?1/19 admission, left heart cath without significant CAD.  Concern for anoxic brain injury ?1/21: MRI shows areas of hypoxic-ischemic injury bilaterally.  ?1/23 severe brain damage ?1/25: PEG . Neuro exam unchanged.  Does exhibit new eyelid tremor like movements and lip/jaw tremor like vs clonus movement.  Bolused Keppra and increased maintenance dose.  Repeat EEG. CTH: concerning for progression of anoxic injury.  ?1/26: Repeat EEG unchanged and continues to show myoclonic seizures despite addition of Valproic acid. [name] for continuous EEG ?1/28: Continues to have myoclonic sz on EEG ?2/3 no meaningful neuro recovery.  ?2/6 New fever to 102, WBC 16K - treated with vancomycin for 7d based on cultures.  ?2/11: percutaneous tracheostomy ?2/13 >2/20 on trach collar. No vent.  ?2/20 Trach change to 6 cuffless ?2/28: fever ?3/13: eliquis started ?4/3: fever ?06/06/2021 CT of the chest with new PE and haziness consistent with pneumonia ?4/5-4/9: responding well to PRN lasix ?4/11 fever again after completion of abx- work up in progress ?4/13 continues to have low-grade temps cultures from 4/11 no growth to date-suspect silent/occult aspiration-heparin changed back to Eliquis 4/13 ?4/14: Work-up negative with no growth to date but red menance Tmax elevated 99.4 and respiratory rate up-x-ray did not show any pneumonia but probably silently aspirating ?Had been awaiting [place] Medicaid for home nursing care ? ?  ?Assessment and Plan: ? ?Cardiac arrest [facility]) ?Postarrest anoxic  encephalopathy ?Postarrest respiratory failure s/p tracheostomy ?S/P PEG tube placement ?Postarrest myoclonus: ?2D echocardiogram showed reduced LV function at <20%.  Now status post trach and PEG.   ?Remains obtunded with anoxic encephalopathy-on Keppra, Klonopin for breakthrough myoclonus.   ?At this time, plan is home with home health ?-Wife has reportedly completed education for trach and PEG tube ?-Enteral tube feeding trach kit and supplies to be arranged. PCCM managing tracheostomy. ?-Seen by palliative care multiple times this admission, patient's wife continues to want full code and full scope of treatment ?-Plan for discharge home with extensive home health services, nursing care being arranged by [facility], tentative discharge date is 5/4 ? 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   ?Patient currently keeps his eyes open, but does not answer questions or follow commands ? ?Acute respiratory failure ([place]) ?Status post trach collar.  Continue supportive care ? ?RLL pneumonia ?Previously had completed vancomycin for MSSA and [name] in early march.  Received more than 7-day course of IV Fortaz. ?On 4/3, CT chest was ordered that indicated an area of alveolar infiltration that may represent pneumonia.  Tracheal aspirate has been sent for culture and is significant for normal respiratory flora. Concerning for aspiration. ?-s/p Unasyn x 7 days ? ?PE/DVT (deep venous thrombosis) ([place]) ?-CTA chest 4/4 noted bilateral PE ?-Duplex ultrasound of lower extremity on 05/15/2021 showed age indeterminate deep vein thrombosis involving the left femoral vein, left proximal profunda vein, left posterior tibial veins, left popliteal vein, and left peroneal veins.  ?-Was on Eliquis initially, briefly treated with Lovenox and then back on Eliquis now ?-Continue Eliquis ?-repeated duplex (due to fever) and clot has not propagated.   ? ?  Dysphagia ?Continue PEG tube feeding.  Tolerating but high risk of  aspiration ?-Aspiration precautions ? ?Recurrent fever ?-Could be tracheobronchitis, silent aspiration vs central fever ?-Temp of 100.4 few days ago, this morning temp of 100 ?-Will reculture if fever recurs ?-Continue pulmonary toilet, WBC count is stable ?-Secretions unchanged, continue pulmonary toilet ? ?Pressure injury of skin ?Lateral penis, posterior right heel, right foot. Not present on admission. ? ?HTN (hypertension) ?-Continue Coreg, Cardura, hydralazine and PRN Labetalol,  isosorbide dinitrate 30 mg TID ? ?HFrEF (heart failure with reduced ejection fraction) ([place]) ?2D echocardiogram-LVEF < 20%,  ?-Continue coreg, imdur, spironolactone ?-Has not required any diuretics in the last 2 months, overall prognosis is very poor ? ?Myoclonus ?Seen by neurology.  Continue current AED with Klonopin, Keppra. Per neuro notes, suppressing his myoclonus will not ultimately change the prognosis  ? ?Hypernatremia-resolved as of 06/09/2021 ? ?Hyperkalemia ?-resolved ? ?Overall poor prognosis ?Goals of care discussions ?-Remains unresponsive with trach, PEG, pressure wounds now ?-Seen by palliative care multiple times this admission, patient's wife continues to want full code and full scope of treatment ? ? ?DVT prophylaxis: apixaban (ELIQUIS) tablet 5 mg  ?  Code Status: Full Code ?Family Communication: No family at bedside,  ? ?Disposition Plan: Home with home health services once arrangements completed by [facility] ?  ? ?Consultants:  ?PCCM ?Neurology ?Palliative care ?ID ?IR ?GI ?cards ? ? ?Subjective: ?-No events overnight per staff, Tmax was 99 ? ?Objective: ?Vitals:  ? 07/01/21 0508 07/01/21 0541 07/01/21 0742 07/01/21 0800  ?BP: (!) 147/111 (!) 134/92 (!) 131/101 (!) 144/93  ?Pulse: 100  97 100  ?Resp: 20  (!) 26 (!) 22  ?Temp: 99.9 ?F (37.7 ?C)  99.7 ?F (37.6 ?C)   ?TempSrc: Axillary  Oral   ?SpO2:   97% 97%  ?Weight: 90 kg     ?Height:      ? ? ?Intake/Output Summary (Last 24 hours) at 07/01/2021 1053 ?Last data filed at  07/01/2021 0955 ?Gross per 24 hour  ?Intake 28449.33 ml  ?Output 2350 ml  ?Net 26099.33 ml  ? ?Filed Weights  ? 06/29/21 0612 06/30/21 0335 07/01/21 0508  ?Weight: 91 kg 91 kg 90 kg  ? ? ?Examination: ? ?Gen: Obese chronically ill male laying in bed, eyes open, does not follow commands ?HEENT: Tracheostomy, trach collar with thick white secretions, 5 L,  ?CVS: S1-S2, regular rhythm ?Lungs: Conducted upper airway sounds ?Abdomen: Soft, nontender, PEG tube with tube feeds infusing ?Extremities: No edema, dressing over pressure wounds  ? ?Data Reviewed: I have personally reviewed following labs and imaging studies ? ?CBC: ?Recent Labs  ?Lab 06/29/21 ?7106  ?WBC 7.7  ?HGB 10.8*  ?HCT 33.7*  ?MCV 97.1  ?PLT 282  ? ?Basic Metabolic Panel: ?Recent Labs  ?Lab 06/29/21 ?2694  ?NA 137  ?K 4.3  ?CL 100  ?CO2 29  ?GLUCOSE 126*  ?BUN 33*  ?CREATININE 0.79  ?CALCIUM 8.9  ? ?GFR: ?Estimated Creatinine Clearance: 124.7 mL/min (by C-G formula based on SCr of 0.79 mg/dL). ?Liver Function Tests: ?No results for input(s): AST, ALT, ALKPHOS, BILITOT, PROT, ALBUMIN in the last 168 hours. ? ? ?No results for input(s): LIPASE, AMYLASE in the last 168 hours. ?No results for input(s): AMMONIA in the last 168 hours. ?Coagulation Profile: ?No results for input(s): INR, PROTIME in the last 168 hours. ?Cardiac Enzymes: ?No results for input(s): CKTOTAL, CKMB, CKMBINDEX, TROPONINI in the last 168 hours. ?BNP (last 3 results) ?No results for input(s): PROBNP in the last  8760 hours. ?HbA1C: ?No results for input(s): HGBA1C in the last 72 hours. ?CBG: ?No results for input(s): GLUCAP in the last 168 hours. ?Lipid Profile: ?No results for input(s): CHOL, HDL, LDLCALC, TRIG, CHOLHDL, LDLDIRECT in the last 72 hours. ?Thyroid Function Tests: ?No results for input(s): TSH, T4TOTAL, FREET4, T3FREE, THYROIDAB in the last 72 hours. ?Anemia Panel: ?No results for input(s): VITAMINB12, FOLATE, FERRITIN, TIBC, IRON, RETICCTPCT in the last 72 hours. ?Sepsis  Labs: ?No results for input(s): PROCALCITON, LATICACIDVEN in the last 168 hours. ? ? ?No results found for this or any previous visit (from the past 240 hour(s)). ?  ? ? ? ? ? ?Radiology Studies: ?No results found. ? ? ?

## 2021-07-02 NOTE — Progress Notes (Signed)
Patient seen and examined, no changes from my note yesterday ?-Awaiting safe discharge plan ? ?Zannie Cove, MD ?

## 2021-07-03 DIAGNOSIS — I469 Cardiac arrest, cause unspecified: Secondary | ICD-10-CM | POA: Diagnosis not present

## 2021-07-03 LAB — BASIC METABOLIC PANEL
Anion gap: 7 (ref 5–15)
BUN: 31 mg/dL — ABNORMAL HIGH (ref 6–20)
CO2: 29 mmol/L (ref 22–32)
Calcium: 9 mg/dL (ref 8.9–10.3)
Chloride: 100 mmol/L (ref 98–111)
Creatinine, Ser: 0.64 mg/dL (ref 0.61–1.24)
GFR, Estimated: >60 mL/min (ref >60)
Glucose, Bld: 100 mg/dL — ABNORMAL HIGH (ref 70–99)
Potassium: 4.9 mmol/L (ref 3.5–5.1)
Sodium: 136 mmol/L (ref 135–145)

## 2021-07-03 LAB — CBC
HCT: 36.7 % — ABNORMAL LOW (ref 39.0–52.0)
Hemoglobin: 11.9 g/dL — ABNORMAL LOW (ref 13.0–17.0)
MCH: 31.2 pg (ref 26.0–34.0)
MCHC: 32.4 g/dL (ref 30.0–36.0)
MCV: 96.3 fL (ref 80.0–100.0)
Platelets: 242 10*3/uL (ref 150–400)
RBC: 3.81 MIL/uL — ABNORMAL LOW (ref 4.22–5.81)
RDW: 13.4 % (ref 11.5–15.5)
WBC: 7.8 10*3/uL (ref 4.0–10.5)
nRBC: 0 % (ref 0.0–0.2)

## 2021-07-03 NOTE — Plan of Care (Signed)
  Problem: Clinical Measurements: Goal: Will remain free from infection Outcome: Progressing   Problem: Clinical Measurements: Goal: Diagnostic test results will improve Outcome: Progressing   Problem: Clinical Measurements: Goal: Respiratory complications will improve Outcome: Progressing   Problem: Nutrition: Goal: Adequate nutrition will be maintained Outcome: Progressing   Problem: Coping: Goal: Level of anxiety will decrease Outcome: Progressing   

## 2021-07-03 NOTE — TOC Progression Note (Addendum)
Transition of Care (TOC) - Progression Note  ? ? ?Patient Details  ?Name: Billy Cherry ?MRN: [id number] ?Date of Birth: [birth year] ? ?Transition of Care (TOC) CM/SW Contact  ?Zenon Mayo, RN ?Phone Number: ?07/03/2021, 10:28 AM ? ?Clinical Narrative:    ?NCM spoke with wife over the phone, she was here early this am doing medications with the Nurse thru the peg tube.  Wife states she needs APP mattress, chuck pads, diaper wipes and open briefs XL.  This NCM contacted Zach with [employer] to give referral to .   ? ?NCM spoke with [name] with [facility], he states he has not spoken to [name] yet today, but he will give her a call to see if the schedule he sent her will work for her or not.  This NCM informed [name] that she also wants to see if [facility] can have 12 hrs shift staff for her.  This NCM contacted [name] with [employer] at [phone], he states he forwarded the information over to their [place] office, he will call them to see if this is something that they can do. NCM gave Cristie Hem the DTP team contact information.   ? ?NCM received call from [place] with [employer] [facility] out of the [place] office his phone is [phone].  He states he will call [name] today to talk to her about what kind of hours she is wanting.  Bobette Mo will reach out to the DTP team with follow up.  ? ? ?Expected Discharge Plan: [place] ?Barriers to Discharge: Barriers Resolved ? ?Expected Discharge Plan and Services ?Expected Discharge Plan: [place] ?In-house Referral: Hospice / Palliative Care ?Discharge Planning Services: CM Consult ?Post Acute Care Choice: Long Term Acute Care (LTAC) ?Living arrangements for the past 2 months: [place] ?                ?DME Arranged: N/A ?DME Agency: [employer] ?  ?  ?  ?HH Arranged: Nurse's Aide ?[place] Agency: [place] ?  ?  ?Representative spoke with at [place]: [name] ? ? ?Social Determinants of Health (SDOH) Interventions ?  ? ?Readmission Risk  Interventions ? ?  04/14/2021  ? 11:14 AM  ?Readmission Risk Prevention Plan  ?Transportation Screening Complete  ?[place] or Home Care Consult Complete  ?Social Work Consult for [place] Planning/Counseling Complete  ?Palliative Care Screening Complete  ?Medication Review Press photographer) Complete  ? ? ?

## 2021-07-03 NOTE — Progress Notes (Signed)
Patient's significant other at bedside this morning, gave patient medicine through the peg tube without any difficulty. Patient's significant other expressed confidence in checking PEG residuals, and giving medicine. All questions regarding medicine administration through the PEG tube answered. Aware patient's HOB has to be up during med and tube feeding administrations. Marcille Blanco, RN ? ?

## 2021-07-03 NOTE — Plan of Care (Addendum)
?  Problem: Clinical Measurements: ?Goal: Will remain free from infection ?Outcome: Progressing ?Goal: Respiratory complications will improve ?Outcome: Progressing ?  ?Problem: Nutrition: ?Goal: Adequate nutrition will be maintained ?Outcome: Progressing ?  ?Problem: Elimination: ?Goal: Will not experience complications related to bowel motility ?Outcome: Progressing ?  ?Problem: Pain Managment: ?Goal: General experience of comfort will improve ?Outcome: Progressing ?  ?Problem: Safety: ?Goal: Ability to remain free from injury will improve ?Outcome: Progressing ?  ?Problem: Skin Integrity: ?Goal: Risk for impaired skin integrity will decrease ?Outcome: Progressing ?  ?

## 2021-07-03 NOTE — Progress Notes (Signed)
Patient seen and examined, no changes from my note 4/29 ?-Remains stable on trach collar, wife at bedside this morning doing trach care ?-Awaiting safe disposition, PCCM following weekly, poor overall prognosis ? ?Zannie Cove, MD ?

## 2021-07-03 NOTE — Progress Notes (Signed)
?  NAME:  Billy Cherry, MRN:  [id number], DOB:  12/21/1970, LOS: 95 ?ADMISSION DATE:  03/30/2021, CONSULTATION DATE:  1/19 ?REFERRING MD:  [place], CHIEF COMPLAINT:  Found down, cardiac arrest  ? ?Brief Pt Description / Synopsis:  ?51 year old male with out-of-hospital V. fib cardiac arrest in setting of respiratory arrest due to suspected angioedema from ACE inhibitor leading to asphyxiation.  Concern on his 12 lead EKG for anterolateral ST elevation so he was taken to the cath lab where his study showed no significant coronary artery disease and an LVEF < 20%  Now with anoxic brain injury. ? ?Pertinent  Medical History  ?Hypertension ? ?Significant Hospital Events: ?Including procedures, antibiotic start and stop dates in addition to other pertinent events   ?1/19 admission, left heart cath without significant CAD.  Concern for anoxic brain injury ?1/20: MRI pending, Neuro following ?1/21: MRI shows areas of hypoxic-ischemic injury bilaterally. Fevered; cultures obtained. ?1/23 severe brain damage ?1/24 evidence of severe brain damage, remains critically ill ?1/25: Plan for PEG today. Neuro exam unchanged.  Does exhibit new eyelid tremor like movements and lip/jaw tremor like vs clonus movement.  Bolused Keppra and increased maintenance dose.  Repeat EEG. CTH: concerning for progression of anoxic injury.  ?1/26: Repeat EEG unchanged and continues to show myoclonic seizures despite addition of Valproic acid yesterday. transfer to [facility] for continuous EEG ?1/28: Continues to have myoclonic sz on EEG ?1/29 No acute issues overnight ?2/3 no meaningful neuro recovery. Diuresed ?2/5 unchanged ?2/6 New fever to 102, WBC 16K - treated with vancomycin for 7d based on cultures.  ?2/11: percutaneous tracheostomy ?2/13 >2/20 on trach collar. No vent.  ?2/20 Trach change to 6 cuffless ?2/27 status unchanged ?3/13 unchanged ?3/20 due for trach exchange given continue secretions (although improved) will stick to 6 cuffless  for exchange  ?06/05/2021 Continued thick  secretions, scant blood tinged 4/3/am ?06/06/2021 CT of the chest with new PE and haziness consistent with pneumonia ? ?Interim History / Subjective:  ?No distress ?Remains comatose ? ?Objective   ?Blood pressure 104/79, pulse 94, temperature 98.5 ?F (36.9 ?C), temperature source Oral, resp. rate 20, height 5\' 10"  (1.778 m), weight 94 kg, SpO2 100 %. ?   ?FiO2 (%):  [28 %] 28 %  ? ?Intake/Output Summary (Last 24 hours) at 07/03/2021 1119 ?Last data filed at 07/03/2021 1042 ?Gross per 24 hour  ?Intake 4555 ml  ?Output 2575 ml  ?Net 1980 ml  ? ? ? ?Filed Weights  ? 07/01/21 0508 07/02/21 0041 07/03/21 0458  ?Weight: 90 kg 95 kg 94 kg  ? ?Examination:  ?Dilated fixed pupils ?GCS3 ?No resp distress on trach collar ?Minimal secretions ? ?BMP/CBC look okay ?No new chest imaging ? ?Ancillary tests personally reviewed:    ? ?Assessment & Plan:  ?OOH Vfib arrest ?Post arrest anoxic brain injury, severe ?Post arrest myoclonus improved with klonopin ?Post arrest respiratory failure with prolonged mechanical ventilation s/p tracheostomy ?Post arrest cardiomyopathy ?Post arrest AKI, resolved ?HTN- stable on current regimen ?PE- on eliquis ? ?Plan:  ?Nothing new to add ?Continue trach collar ?Continue eliquis indefinitely as high risk of recurrence ?Grim prognosis ? ?Will see weekly, next 5/8 ? ?7/8 MD PCCM ? ?

## 2021-07-03 NOTE — Consult Note (Signed)
WOC Nurse wound follow up ?Wound type:Pressure injury ?Measurement: ?Right heel:  Unstageable PI with stable blood blister beneath skin. No fluctuance, no erythema or induration of surrounding skin. ?Right foot, metatarsal heads 2-4: 2cm x 4cm x 0.1cm. Nearly reepithelialized. Evidence of wound healing (contraction, scarring, reepithelialization) ?Wound bed:As noted above ?Drainage (amount, consistency, odor) None ?Periwound: intact  ?Dressing procedure/placement/frequency: The existing POC is still appropriate: Prevalon Boots, positioning up in bed so that the feet are not near the footboard, topical care to the heel is with a silicone foam heel dressing and topical care to the metatarsal heads is with a xeroform (antimicrobial, nonadherent) secured with a silicone foam dressing. ? ?WOC nursing team will follow, and will remain available to this patient, the nursing and medical teams.  Please re-consult if needed. ? ?Thank you, ?Ladona Mow, MSN, RN, GNP, CWOCN, CWON-AP, FAAN  ?Pager# [phone]  ? ?  ?

## 2021-07-04 NOTE — Progress Notes (Signed)
Nutrition Follow-up ? ?DOCUMENTATION CODES:  ? ?Not applicable ? ?INTERVENTION:  ? ?Continue tube feeding via PEG:  ?1.5 cartons of Osmolite 1.5 - QID (355 mL each bolus) ?45 mL ProSource TF - BID ?300 mL free water q4h  ?Provides 2210 kcal, 111 gm PRO, and 1086 mL free water (2879 mL total free water) daily.  ?  ?Continue Juven BID per tube, each packet provides 80 calories, 8 grams of carbohydrate, 2.5  grams of protein to promote wound healing ? ?NUTRITION DIAGNOSIS:  ? ?Inadequate oral intake related to inability to eat as evidenced by NPO status; ongoing ? ?GOAL:  ? ?Patient will meet greater than or equal to 90% of their needs; met with TF ? ?MONITOR:  ? ?Labs, Weight trends, TF tolerance, Skin, I & O's ? ?REASON FOR ASSESSMENT:  ? ?Ventilator, Consult ?Enteral/tube feeding initiation and management ? ?ASSESSMENT:  ? ?51 year old male who originally presented to [facility] on 1/19 after out-of-hospital cardiac arrest due to suspected angioedema from ACE inhibitor leading to asphyxiation. PMH of HTN, HLD. ? ?2/11 - tracheostomy placed ?2/22 - PEG placed  ? ?Pt remains comatose. Pt has been tolerating his bolus tube feeds via PEG. Recommend continuation of current regimen. Labs and medications reviewed.  ? ?Diet Order:  NPO ?Diet Order   ? ? None  ? ?  ? ? ?EDUCATION NEEDS:  ? ?Not appropriate for education at this time ? ?Skin:  Skin Assessment: Skin Integrity Issues: ?Skin Integrity Issues:: Stage II, DTI ?DTI: heel ?Stage II: R foot ?Stage III: N/A ?Other: MASD Buttocks ? ?Last BM:  5/2 ? ?Height:  ? ?Ht Readings from Last 1 Encounters:  ?04/18/21 $RemoveBe'5\' 10"'BxrhbIACU$  (1.778 m)  ? ? ?Weight:  ? ?Wt Readings from Last 1 Encounters:  ?07/04/21 81 kg  ? ? ?Ideal Body Weight:  75.5 kg ? ?BMI:  Body mass index is 25.62 kg/m?. ? ?Estimated Nutritional Needs:  ? ?Kcal:  2200-2400 ? ?Protein:  110-125 grams ? ?Fluid:  >/= 2.2 L ? ?Corrin Parker, MS, RD, LDN ?RD pager number/after hours weekend pager number on Amion. ? ?

## 2021-07-04 NOTE — Plan of Care (Signed)
?  Problem: Clinical Measurements: ?Goal: Respiratory complications will improve ?Outcome: Progressing ?  ?Problem: Nutrition: ?Goal: Adequate nutrition will be maintained ?Outcome: Progressing ?  ?Problem: Coping: ?Goal: Level of anxiety will decrease ?Outcome: Progressing ?  ?Problem: Elimination: ?Goal: Will not experience complications related to bowel motility ?Outcome: Progressing ?Goal: Will not experience complications related to urinary retention ?Outcome: Progressing ?  ?

## 2021-07-04 NOTE — TOC Progression Note (Signed)
Transition of Care (TOC) - Progression Note  ? ? ?Patient Details  ?Name: Billy Cherry ?MRN: [id number] ?Date of Birth: 10/07/1970 ? ?Transition of Care (TOC) CM/SW Contact  ?Janae Bridgeman, RN ?Phone Number: ?07/04/2021, 9:03 AM ? ?Clinical Narrative:    ?CM and MSW with DTP Team have assumed TOC needs for the patient at this time.  I called and spoke with Olevia Perches, Admissions director with [facility] and he spoke with Linus Salmons, CM with TOC yesterday and was asked to explore staffing options through the agency to provide the 12 hour shifts that have been requested by the patient's wife.  Brendon states that the [employer] at this time are exploring staffing at this time and will follow up with the DTP Team.  I will follow up with the patient's wife today regarding disposition needs. ? ? ?Expected Discharge Plan: Home w Home Health Services ?Barriers to Discharge: Barriers Resolved ? ?Expected Discharge Plan and Services ?Expected Discharge Plan: Home w Home Health Services ?In-house Referral: Hospice / Palliative Care ?Discharge Planning Services: CM Consult ?Post Acute Care Choice: Long Term Acute Care (LTAC) ?Living arrangements for the past 2 months: Single Family Home ?                ?DME Arranged: N/A ?DME Agency: [employer] ?  ?  ?  ?HH Arranged: Nurse's Aide ?HH Agency: [facility] ?  ?  ?Representative spoke with at [facility] Agency: [name] ? ? ?Social Determinants of Health (SDOH) Interventions ?  ? ?Readmission Risk Interventions ? ?  04/14/2021  ? 11:14 AM  ?Readmission Risk Prevention Plan  ?Transportation Screening Complete  ?[place] or Home Care Consult Complete  ?Social Work Consult for [place] Care Planning/Counseling Complete  ?Palliative Care Screening Complete  ?Medication Review Oceanographer) Complete  ? ? ?

## 2021-07-04 NOTE — Progress Notes (Signed)
Mobility Specialist Progress Note: ? ? 07/04/21 1640  ?Mobility  ?Activity Turned to right side;Turned to left side;Turned to back - supine  ?Level of Assistance Total care  ?Activity Response Tolerated well  ?$Mobility charge 1 Mobility  ? ?NT requesting assistance to reposition patient and change linens. Pt tolerated well.  ? ?Addison Lank ?Acute Rehab ?Phone: [phone] ?Office Phone: [phone] ? ?

## 2021-07-04 NOTE — Progress Notes (Signed)
?PROGRESS NOTE ? ? ? ?Billy Cherry  YIA:[id number] DOB: 1970-09-17 DOA: 03/30/2021 ?PCP: Patient, No Pcp Per (Inactive)  ? ?Brief Narrative:  ?51 years old male with past medical history of hypertension was brought into the hospital after being found unresponsive by his wife.  Out of hospital V-fib arrest, received CPR, 1 shock with defibrillator, 2 doses of epinephrine with ROSC, EKG noted anterolateral STEMI ?1/19 admission, left heart cath without significant CAD.  Concern for anoxic brain injury ?1/21: MRI shows areas of hypoxic-ischemic injury bilaterally.  ?1/23 severe brain damage ?1/25: PEG . Neuro exam unchanged.  Does exhibit new eyelid tremor like movements and lip/jaw tremor like vs clonus movement.  Bolused Keppra and increased maintenance dose.  Repeat EEG. CTH: concerning for progression of anoxic injury.  ?1/26: Repeat EEG unchanged and continues to show myoclonic seizures despite addition of Valproic acid. transfered to [name] from [facility] for continuous EEG ?1/28: Continues to have myoclonic sz on EEG ?2/3 no meaningful neuro recovery.  ?2/6 New fever to 102, WBC 16K - treated with vancomycin for 7d based on cultures.  ?2/11: percutaneous tracheostomy ?2/13 >2/20 on trach collar. No vent.  ?2/20 Trach change to 6 cuffless ?2/28: fever ?3/13: eliquis started ?4/3: fever ?06/06/2021 CT of the chest with new PE and haziness consistent with pneumonia ?4/5-4/9: responding well to PRN lasix ?4/11 fever again after completion of abx- work up in progress ?4/13 continues to have low-grade temps cultures from 4/11 no growth to date-suspect silent/occult aspiration-heparin changed back to Eliquis 4/13 ?4/14: Work-up negative with no growth to date but red menance Tmax elevated 99.4 and respiratory rate up-x-ray did not show any pneumonia but probably silently aspirating ?Now remains stable medically, awaiting safe disposition, home care/nursing arrangement, TOC following ? ?  ?Assessment and Plan: ? ?Cardiac  arrest [facility]) ?Postarrest anoxic encephalopathy ?Postarrest respiratory failure s/p tracheostomy ?S/P PEG tube placement ?Postarrest myoclonus: ?2D echocardiogram showed reduced LV function at <20%.  Now status post trach and PEG.   ?Remains obtunded with anoxic encephalopathy-on Keppra, Klonopin for breakthrough myoclonus.   ?At this time, plan is home with home health ?-Wife has reportedly completed education for trach and PEG tube ?-Enteral tube feeding trach kit and supplies to be arranged. PCCM managing tracheostomy. ?-Seen by palliative care multiple times this admission, patient's wife continues to want full code and full scope of treatment ?-Plan for discharge home with extensive home health services, nursing care being arranged by [facility], possibly later this week ? 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) ?Continue supportive care.   ?Patient currently keeps his eyes open, but does not answer questions or follow commands ? ?Acute respiratory failure ([place]) ?Status post trach collar.  Continue supportive care ? ?RLL pneumonia ?Previously had completed vancomycin for MSSA and [name] in early march.  Received more than 7-day course of IV Fortaz. ?On 4/3, CT chest was ordered that indicated an area of alveolar infiltration that may represent pneumonia.  Tracheal aspirate has been sent for culture and is significant for normal respiratory flora. Concerning for aspiration. ?-s/p Unasyn x 7 days ? ?PE/DVT (deep venous thrombosis) ([place]) ?-CTA chest 4/4 noted bilateral PE ?-Duplex ultrasound of lower extremity on 05/15/2021 showed age indeterminate deep vein thrombosis involving the left femoral vein, left proximal profunda vein, left posterior tibial veins, left popliteal vein, and left peroneal veins.  ?-Was on Eliquis initially, briefly treated with Lovenox and then back on Eliquis now ?-Continue Eliquis ?-repeated duplex (due to fever) and clot has not propagated.   ? ?  Dysphagia ?Continue PEG tube feeding.   Tolerating but high risk of aspiration ?-Aspiration precautions ? ?Recurrent fever ?-Could be tracheobronchitis, silent aspiration vs central fever ?-Temp of 100.4 few days ago, this morning temp of 100 ?-Will reculture if fever recurs ?-Continue pulmonary toilet, WBC count is stable ?-Secretions unchanged, continue pulmonary toilet ? ?Pressure injury of skin ?posterior right heel, right foot. Not present on admission. ? ?HTN (hypertension) ?-Continue Coreg, Cardura, hydralazine and PRN Labetalol,  isosorbide dinitrate 30 mg TID ? ?HFrEF (heart failure with reduced ejection fraction) ([place]) ?2D echocardiogram-LVEF < 20%,  ?-Continue coreg, imdur, spironolactone ?-Has not required any diuretics in the last 2 months, overall prognosis is very poor ? ?Myoclonus ?Seen by neurology.  Continue current AED with Klonopin, Keppra. Per neuro notes, suppressing his myoclonus will not ultimately change the prognosis  ? ?Hypernatremia-resolved as of 06/09/2021 ? ?Hyperkalemia ?-resolved ? ?Overall poor prognosis ?Goals of care discussions ?-Remains unresponsive with trach, PEG, pressure wounds now ?-Seen by palliative care multiple times this admission, patient's wife continues to want full code and full scope of treatment ? ? ?DVT prophylaxis: apixaban (ELIQUIS) tablet 5 mg  ?  Code Status: Full Code ?Family Communication: No family at bedside, updated wife 5/1 ? ?Disposition Plan: Home with home health services once arrangements completed by [facility] ?  ? ?Consultants:  ?PCCM ?Neurology ?Palliative care ?ID ?IR ?GI ?cards ? ? ?Subjective: ?-No events overnight per staff, Tmax was 99 ? ?Objective: ?Vitals:  ? 07/04/21 0605 07/04/21 0625 07/04/21 0740 07/04/21 1116  ?BP:  (!) 121/93  (!) 125/96  ?Pulse: 92 90 91 84  ?Resp: (!) 25 (!) 27 (!) 26 (!) 22  ?Temp:      ?TempSrc:      ?SpO2: 98% 99% 97% 100%  ?Weight:      ?Height:      ? ? ?Intake/Output Summary (Last 24 hours) at 07/04/2021 1240 ?Last data filed at 07/04/2021 1950 ?Gross per 24  hour  ?Intake 1500 ml  ?Output 2750 ml  ?Net -1250 ml  ? ?Filed Weights  ? 07/02/21 0041 07/03/21 0458 07/04/21 0500  ?Weight: 95 kg 94 kg 81 kg  ? ? ?Examination: ? ?Gen: Obese chronically ill male laying in bed, eyes open, does not follow commands ?HEENT: Tracheostomy with trach collar on 5 L O2 ?CVS: S1-S2, regular rhythm ?Lungs: Few conducted upper airway sounds ?Abdomen: Soft, nontender, PEG tube with tube feeds infusing ?Extremities: No edema, dressing over pressure wounds  ? ?Data Reviewed: I have personally reviewed following labs and imaging studies ? ?CBC: ?Recent Labs  ?Lab 06/29/21 ?0343 07/03/21 ?9326  ?WBC 7.7 7.8  ?HGB 10.8* 11.9*  ?HCT 33.7* 36.7*  ?MCV 97.1 96.3  ?PLT 282 242  ? ?Basic Metabolic Panel: ?Recent Labs  ?Lab 06/29/21 ?0343 07/03/21 ?7124  ?NA 137 136  ?K 4.3 4.9  ?CL 100 100  ?CO2 29 29  ?GLUCOSE 126* 100*  ?BUN 33* 31*  ?CREATININE 0.79 0.64  ?CALCIUM 8.9 9.0  ? ?GFR: ?Estimated Creatinine Clearance: 114.1 mL/min (by C-G formula based on SCr of 0.64 mg/dL). ?Liver Function Tests: ?No results for input(s): AST, ALT, ALKPHOS, BILITOT, PROT, ALBUMIN in the last 168 hours. ? ? ?No results for input(s): LIPASE, AMYLASE in the last 168 hours. ?No results for input(s): AMMONIA in the last 168 hours. ?Coagulation Profile: ?No results for input(s): INR, PROTIME in the last 168 hours. ?Cardiac Enzymes: ?No results for input(s): CKTOTAL, CKMB, CKMBINDEX, TROPONINI in the last 168 hours. ?BNP (last 3  results) ?No results for input(s): PROBNP in the last 8760 hours. ?HbA1C: ?No results for input(s): HGBA1C in the last 72 hours. ?CBG: ?No results for input(s): GLUCAP in the last 168 hours. ?Lipid Profile: ?No results for input(s): CHOL, HDL, LDLCALC, TRIG, CHOLHDL, LDLDIRECT in the last 72 hours. ?Thyroid Function Tests: ?No results for input(s): TSH, T4TOTAL, FREET4, T3FREE, THYROIDAB in the last 72 hours. ?Anemia Panel: ?No results for input(s): VITAMINB12, FOLATE, FERRITIN, TIBC, IRON, RETICCTPCT  in the last 72 hours. ?Sepsis Labs: ?No results for input(s): PROCALCITON, LATICACIDVEN in the last 168 hours. ? ? ?No results found for this or any previous visit (from the past 240 hour(s)). ?  ? ? ?

## 2021-07-05 NOTE — Progress Notes (Signed)
?PROGRESS NOTE ? ? ? ?Mikhai Bienvenue  OFB:[id number] DOB: Jan 24, 1971 DOA: 03/30/2021 ?PCP: Patient, No Pcp Per (Inactive)  ? ?Brief Narrative:  ?51 years old male with past medical history of hypertension was brought into the hospital after being found unresponsive by his wife.  Out of hospital V-fib arrest, received CPR, 1 shock with defibrillator, 2 doses of epinephrine with ROSC, EKG noted anterolateral STEMI ?1/19 admission, left heart cath without significant CAD.  Concern for anoxic brain injury ?1/21: MRI shows areas of hypoxic-ischemic injury bilaterally.  ?1/23 severe brain damage ?1/25: PEG . Neuro exam unchanged.  Does exhibit new eyelid tremor like movements and lip/jaw tremor like vs clonus movement.  Bolused Keppra and increased maintenance dose.  Repeat EEG. CTH: concerning for progression of anoxic injury.  ?1/26: Repeat EEG unchanged and continues to show myoclonic seizures despite addition of Valproic acid. transfered to [name] from [facility] for continuous EEG ?1/28: Continues to have myoclonic sz on EEG ?2/3 no meaningful neuro recovery.  ?2/6 New fever to 102, WBC 16K - treated with vancomycin for 7d based on cultures.  ?2/11: percutaneous tracheostomy ?2/13 >2/20 on trach collar. No vent.  ?2/20 Trach change to 6 cuffless ?2/28: fever ?3/13: eliquis started ?4/3: fever ?06/06/2021 CT of the chest with new PE and haziness consistent with pneumonia ?4/5-4/9: responding well to PRN lasix ?4/11 fever again after completion of abx- work up in progress ?4/13 continues to have low-grade temps cultures from 4/11 no growth to date-suspect silent/occult aspiration-heparin changed back to Eliquis 4/13 ?4/14: Work-up negative with no growth to date but red menance Tmax elevated 99.4 and respiratory rate up-x-ray did not show any pneumonia but probably silently aspirating ?Now remains stable medically, awaiting safe disposition, home care/nursing arrangement, TOC/LLOS following ? ?  ?Assessment and  Plan: ? ?Cardiac arrest [facility]) ?Postarrest anoxic encephalopathy ?Postarrest respiratory failure s/p tracheostomy ?S/P PEG tube placement ?Postarrest myoclonus: ?2D echocardiogram showed reduced LV function at <20%.  Now status post trach and PEG.   ?Remains obtunded with anoxic encephalopathy-on Keppra, Klonopin for breakthrough myoclonus.   ?At this time, plan is home with home health ?-Wife has reportedly completed education for trach and PEG tube ?-Enteral tube feeding trach kit and supplies to be arranged. PCCM managing tracheostomy. ?-Seen by palliative care multiple times this admission, patient's wife continues to want full code and full scope of treatment ?-Plan for discharge home with extensive home health services, nursing care being arranged by [facility], possibly later this week ? ?Anoxic encephalopathy ([place]) ?Continue supportive care.   ?Patient currently keeps his eyes open, but does not answer questions or follow commands ? ?Acute respiratory failure ([place]) ?Status post trach collar.  Continue supportive care ? ?RLL pneumonia ?Previously had completed vancomycin for MSSA and [name] in early march.  Received more than 7-day course of IV Fortaz. ?On 4/3, CT chest was ordered that indicated an area of alveolar infiltration that may represent pneumonia.  Tracheal aspirate has been sent for culture and is significant for normal respiratory flora. Concerning for aspiration. ?-s/p Unasyn x 7 days ?-Remains stable off antibiotics, intermittent low-grade fevers, continue pulmonary toilet ? ?PE/DVT (deep venous thrombosis) ([place]) ?-CTA chest 4/4 noted bilateral PE ?-Duplex ultrasound of lower extremity on 05/15/2021 showed age indeterminate deep vein thrombosis involving the left femoral vein, left proximal profunda vein, left posterior tibial veins, left popliteal vein, and left peroneal veins.  ?-Was on Eliquis initially, briefly treated with Lovenox and then back on Eliquis now ?-Continue  Eliquis ?-repeated duplex (due to fever) and clot has not propagated.   ? ?Dysphagia ?Continue PEG tube feeding.  Tolerating but high risk of aspiration ?-Aspiration precautions ? ?Pressure injury of skin ?posterior right heel, right foot. Not present on admission. ? ?HTN (hypertension) ?-Continue Coreg, Cardura, hydralazine and PRN Labetalol,  isosorbide dinitrate 30 mg TID ? ?HFrEF (heart failure with reduced ejection fraction) ([place]) ?2D echocardiogram-LVEF < 20%,  ?-Continue coreg, imdur, spironolactone ?-Has not required any diuretics in the last 2 months, overall prognosis is very poor ? ?Myoclonus ?Seen by neurology.  Continue current AED with Klonopin, Keppra.  ? ?Hypernatremia-resolved as of 06/09/2021 ? ?Hyperkalemia ?-resolved ? ?Overall poor prognosis ?Goals of care discussions ?-Remains unresponsive with trach, PEG, pressure wounds now ?-Seen by palliative care multiple times this admission, patient's wife continues to want full code and full scope of treatment ? ? ?DVT prophylaxis: apixaban (ELIQUIS) tablet 5 mg  ?  Code Status: Full Code ?Family Communication: No family at bedside, updated wife 5/1 ? ?Disposition Plan: Home with home health services once arrangements completed by [facility] ?  ? ?Consultants:  ?PCCM ?Neurology ?Palliative care ?ID ?IR ?GI ?cards ? ? ?Subjective: ?-No events overnight ? ?Objective: ?Vitals:  ? 07/05/21 1105 07/05/21 1200 07/05/21 1302 07/05/21 1322  ?BP: 111/65 99/69  91/66  ?Pulse: 96 93 95 91  ?Resp: 17 (!) 0 (!) 22 (!) 32  ?Temp: 98.9 ?F (37.2 ?C)     ?TempSrc: Oral     ?SpO2: 100% 98% 99% 96%  ?Weight:      ?Height:      ? ? ?Intake/Output Summary (Last 24 hours) at 07/05/2021 1440 ?Last data filed at 07/05/2021 0900 ?Gross per 24 hour  ?Intake 1010 ml  ?Output 1600 ml  ?Net -590 ml  ? ?Filed Weights  ? 07/03/21 0458 07/04/21 0500 07/05/21 0515  ?Weight: 94 kg 81 kg 82 kg  ? ? ?Examination: ? ?Gen: Obese chronically ill male laying in bed, eyes open, does not follow commands  HEENT: Tracheostomy with trach collar, on 5 L O2 ?CVS: S1-S2, regular rate rhythm ?Lungs: Conducted upper airway sounds ?Abdomen: Soft, nontender, PEG tube ?Extremities: No edema, dressing over pressure wounds  ? ?Data Reviewed: I have personally reviewed following labs and imaging studies ? ?CBC: ?Recent Labs  ?Lab 06/29/21 ?0343 07/03/21 ?0165  ?WBC 7.7 7.8  ?HGB 10.8* 11.9*  ?HCT 33.7* 36.7*  ?MCV 97.1 96.3  ?PLT 282 242  ? ?Basic Metabolic Panel: ?Recent Labs  ?Lab 06/29/21 ?0343 07/03/21 ?5374  ?NA 137 136  ?K 4.3 4.9  ?CL 100 100  ?CO2 29 29  ?GLUCOSE 126* 100*  ?BUN 33* 31*  ?CREATININE 0.79 0.64  ?CALCIUM 8.9 9.0  ? ?GFR: ?Estimated Creatinine Clearance: 114.1 mL/min (by C-G formula based on SCr of 0.64 mg/dL). ?Liver Function Tests: ?No results for input(s): AST, ALT, ALKPHOS, BILITOT, PROT, ALBUMIN in the last 168 hours. ? ? ?No results for input(s): LIPASE, AMYLASE in the last 168 hours. ?No results for input(s): AMMONIA in the last 168 hours. ?Coagulation Profile: ?No results for input(s): INR, PROTIME in the last 168 hours. ?Cardiac Enzymes: ?No results for input(s): CKTOTAL, CKMB, CKMBINDEX, TROPONINI in the last 168 hours. ?BNP (last 3 results) ?No results for input(s): PROBNP in the last 8760 hours. ?HbA1C: ?No results for input(s): HGBA1C in the last 72 hours. ?CBG: ?No results for input(s): GLUCAP in the last 168 hours. ?Lipid Profile: ?No results for input(s): CHOL, HDL, LDLCALC, TRIG, CHOLHDL, LDLDIRECT in the  last 72 hours. ?Thyroid Function Tests: ?No results for input(s): TSH, T4TOTAL, FREET4, T3FREE, THYROIDAB in the last 72 hours. ?Anemia Panel: ?No results for input(s): VITAMINB12, FOLATE, FERRITIN, TIBC, IRON, RETICCTPCT in the last 72 hours. ?Sepsis Labs: ?No results for input(s): PROCALCITON, LATICACIDVEN in the last 168 hours. ? ? ?No results found for this or any previous visit (from the past 240 hour(s)). ?  ? ? ? ? ? ?Radiology Studies: ?No results found. ? ? ? ? ? ?Scheduled Meds: ?  apixaban  5 mg Per Tube BID  ? atropine  2 drop Sublingual QID  ? carvedilol  25 mg Per Tube BID  ? chlorhexidine  15 mL Mouth/Throat BID  ? clonazepam  0.5 mg Per Tube TID  ? doxazosin  2 mg Per Tube Daily  ? feeding supp

## 2021-07-05 NOTE — TOC Progression Note (Signed)
Transition of Care (TOC) - Progression Note  ? ? ?Patient Details  ?Name: Billy Cherry ?MRN: [id number] ?Date of Birth: May 29, 1970 ? ?Transition of Care (TOC) CM/SW Contact  ?Curlene Labrum, RN ?Phone Number: ?07/05/2021, 10:42 AM ? ?Clinical Narrative:    ?CM met with the patient at the bedside and the patient's wife in not in the room at this time.  I called and spoke with [name], wife on the phone to discuss transitions of care to home - once private duty staffing can be obtained through [employer] - see previous notes by Roque Cash, RNCM.  CM and MSW with DTP Team assumed care of the patient's discharge needs on 07/04/2021 and will continue to communicate with home health agency to discharge patient home.   ? ?I called and was unable to reach Feliz Beam, Director with [employer] to follow up regarding the agencies ability to establish 12 hour staffing at the home - Secure email sent to [place] at [email or web address] to follow up. ? ?The patient's PCP will be Dr. Steffanie Dunn with [facility] - [phone]. ? ?CM and MSW with DTP Team will continue to follow the patient for discharge needs to home - pending discharge date. ? ? ?Expected Discharge Plan: [place] ?Barriers to Discharge: Other (must enter comment) (Patient's wife - requested private duty services through alternative provider for 12 hours shifts for staffing at the home.) ? ?Expected Discharge Plan and Services ?Expected Discharge Plan: [place] ?In-house Referral: Clinical Social Work ?Discharge Planning Services: CM Consult, Follow-up appt scheduled ?Post Acute Care Choice: Home Health ?Living arrangements for the past 2 months: [place] ?                ?DME Arranged: Trach supplies, Tube feeding, Suction, Hospital bed, Oxygen ?DME Agency: [employer] ?  ?  ?  ?HH Arranged: Therapist, sports Oceanographer through [employer]) ?[place] Agency: [employer] ?Date HH Agency Contacted: 07/04/21 ?Time [place]: 1000 ?Representative spoke with at [place]: Feliz Beam, Director with [employer] [phone] ? ? ?Social Determinants of Health (SDOH) Interventions ?  ? ?Readmission Risk Interventions ? ?  04/14/2021  ? 11:14 AM  ?Readmission Risk Prevention Plan  ?Transportation Screening Complete  ?[place] or Home Care Consult Complete  ?Social Work Consult for [place] Planning/Counseling Complete  ?Palliative Care Screening Complete  ?Medication Review Press photographer) Complete  ? ? ?

## 2021-07-05 NOTE — Progress Notes (Signed)
Patient seen today by trach team for consult.  No education is needed at this time.  All necessary equipment is at beside.   Will continue to follow for progression.  

## 2021-07-06 NOTE — Progress Notes (Signed)
?PROGRESS NOTE ? ? ? ?Billy Cherry [place]  KMM:[id number] DOB: Apr 13, 1970 DOA: 03/30/2021 ?PCP: Patient, No Pcp Per (Inactive)  ? ?Brief Narrative:  ?51 years old male with past medical history of hypertension was brought into the hospital after being found unresponsive by his wife.  Out of hospital V-fib arrest, received CPR, 1 shock with defibrillator, 2 doses of epinephrine with ROSC, EKG noted anterolateral STEMI ?1/19 admission, left heart cath without significant CAD.  Concern for anoxic brain injury ?1/21: MRI shows areas of hypoxic-ischemic injury bilaterally.  ?1/23 severe brain damage ?1/25: PEG . Neuro exam unchanged.  Does exhibit new eyelid tremor like movements and lip/jaw tremor like vs clonus movement.  Bolused Keppra and increased maintenance dose.  Repeat EEG. CTH: concerning for progression of anoxic injury.  ?1/26: Repeat EEG unchanged and continues to show myoclonic seizures despite addition of Valproic acid. transfered to [name] for continuous EEG ?1/28: Continues to have myoclonic sz on EEG ?2/3 no meaningful neuro recovery.  ?2/6 New fever to 102, WBC 16K - treated with vancomycin for 7d based on cultures.  ?2/11: percutaneous tracheostomy ?2/13 >2/20 on trach collar. No vent.  ?2/20 Trach change to 6 cuffless ?2/28: fever ?3/13: eliquis started ?4/3: fever ?06/06/2021 CT of the chest with new PE and haziness consistent with pneumonia ?4/5-4/9: responding well to PRN lasix ?4/11 fever again after completion of abx- work up in progress ?4/13 continues to have low-grade temps cultures from 4/11 no growth to date-suspect silent/occult aspiration-heparin changed back to Eliquis 4/13 ?4/14: Work-up negative with no growth to date but red menance Tmax elevated 99.4 and respiratory rate up-x-ray did not show any pneumonia but probably silently aspirating ? ? ?04/24 patient has been clinically stable, tolerating tube feedings and managing trach secretions, no increase in 02  requirements. ?Continue not interactive and poor prognosis.  ?04/25 no clinical sings of infection.  ? ? ?Now remains stable medically, awaiting safe disposition, home care/nursing arrangement, TOC/LLOS following ? ? ?Plan to discharge home with home health services ? ?  ?Assessment and Plan: ? ?Cardiac arrest ([name])/ Postarrest anoxic encephalopathy / Postarrest respiratory failure s/p tracheostomy, S/P PEG tube placement ?Postarrest myoclonus: ?No changes, stable ? ?2D echocardiogram showed reduced LV function at <20%.  Now status post trach and PEG.   ?Remains obtunded with anoxic encephalopathy-on Keppra, Klonopin for breakthrough myoclonus.   ?At this time, plan is home with home health ?-Wife has reportedly completed education for trach and PEG tube ?-Enteral tube feeding trach kit and supplies to be arranged. PCCM managing tracheostomy. ?-Seen by palliative care multiple times this admission, patient's wife continues to want full code and full scope of treatment ?-Plan for discharge home with extensive home health services, nursing care being arranged by [facility], possibly later this week ? 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   ?Patient currently keeps his eyes open, but does not answer questions or follow commands ?-No changes, stable ? ?Acute respiratory failure ([place]) ?Status post trach collar.  Continue supportive care ?-On 5 L, satting 95% ? ?RLL pneumonia ?Previously had completed vancomycin for MSSA and [name] in early march.  Received more than 7-day course of IV Fortaz. ?On 4/3, CT chest was ordered that indicated an area of alveolar infiltration that may represent pneumonia.  Tracheal aspirate has been sent for culture and is significant for normal respiratory flora. Concerning for aspiration. ?-s/p Unasyn x 7 days ?-Remains stable off antibiotics, intermittent low-grade fevers, continue pulmonary toilet ?-Stable ? ?PE/DVT (  deep venous thrombosis) ([place]) ?-CTA chest  4/4 noted bilateral PE ?-Duplex ultrasound of lower extremity on 05/15/2021 showed age indeterminate deep vein thrombosis involving the left femoral vein, left proximal profunda vein, left posterior tibial veins, left popliteal vein, and left peroneal veins.  ?-Was on Eliquis initially, briefly treated with Lovenox and then back on Eliquis now ?-Continue Eliquis ?-repeated duplex (due to fever) and clot has not propagated.   ? ?Dysphagia ?Continue PEG tube feeding.  Tolerating but high risk of aspiration ?-Aspiration precautions ? ?Pressure injury of skin ?posterior right heel, right foot. Not present on admission. ? ?HTN (hypertension) ?-Continue Coreg, Cardura, hydralazine and PRN Labetalol,  isosorbide dinitrate 30 mg TID ? ?HFrEF (heart failure with reduced ejection fraction) ([place]) ?2D echocardiogram-LVEF < 20%,  ?-Continue coreg, imdur, spironolactone ?-Has not required any diuretics in the last 2 months, overall prognosis is very poor ? ?Myoclonus ?Seen by neurology.  Continue current AED with Klonopin, Keppra.  ? ?Hypernatremia-resolved as of 06/09/2021 ? ?Hyperkalemia ?-resolved ? ?Overall poor prognosis ?Goals of care discussions ?-Remains unresponsive with trach, PEG, pressure wounds now ?-Seen by palliative care multiple times this admission, patient's wife continues to want full code and full scope of treatment ? ? ?DVT prophylaxis: apixaban (ELIQUIS) tablet 5 mg  ?  Code Status: Full Code ?Family Communication: No family at bedside, updated wife 5/1 ? ?Disposition Plan: Home with home health services once arrangements completed by [facility] ?  ? ?Consultants:  ?PCCM ?Neurology ?Palliative care ?ID ?IR ?GI ?cards ? ? ?Subjective: ?-No events overnight ? ?Objective: ?Vitals:  ? 07/06/21 0715 07/06/21 0800 07/06/21 0820 07/06/21 1031  ?BP: (!) 97/49 99/73  119/86  ?Pulse: 93 82  92  ?Resp: (!) 21   (!) 21  ?Temp: 99.3 ?F (37.4 ?C)   98.7 ?F (37.1 ?C)  ?TempSrc: Oral   Oral  ?SpO2: 92% 91% 93% 95%  ?Weight:       ?Height:      ? ? ?Intake/Output Summary (Last 24 hours) at 07/06/2021 1130 ?Last data filed at 07/06/2021 0500 ?Gross per 24 hour  ?Intake 355 ml  ?Output 1750 ml  ?Net -1395 ml  ? ?Filed Weights  ? 07/04/21 0500 07/05/21 0515 07/06/21 0500  ?Weight: 81 kg 82 kg 91 kg  ? ? ?Examination: ?  ?General:  Awake alert, does not follow verbal command, chronically ill male, trached and pegged-  ?HEENT:  Normocephalic, PERRL, otherwise with in Normal limits   ?Neuro:  Limited exam eyes open, nonverbal  ?Lungs:   Clear to auscultation BL, Respirations unlabored,  ?No wheezes /  upper and right upper lobe crackles and rhonchi  ?Cardio:    S1/S2, RRR, No murmure, No Rubs or Gallops   ?Abdomen:  Soft, non-tender, bowel sounds active all four quadrants, ?no guarding or peritoneal signs. ?PEG tube in place  ?Muscular  ?skeletal:  Limited exam -bedbound does not move extremities ?2+ pulses,  symmetric, +1 pitting edema  ?Skin:  Dry, warm to touch, negative for any Rashes,  ?Wounds: Please see nursing documentation ? ?Pressure Injury 05/25/21 Heel Posterior;Right Deep Tissue Pressure Injury - Purple or maroon localized area of discolored intact skin or blood-filled blister due to damage of underlying soft tissue from pressure and/or shear. (Active)  ?05/25/21 0800  ?Location: Heel  ?Location Orientation: Posterior;Right  ?Staging: Deep Tissue Pressure Injury - Purple or maroon localized area of discolored intact skin or blood-filled blister due to damage of underlying soft tissue from pressure and/or shear.  ?Wound Description (  Comments):   ?Present on Admission: No  ?Dressing Type Foam - Lift dressing to assess site every shift 07/05/21 2020  ?   ?Pressure Injury 05/25/21 Foot Right Stage 2 -  Partial thickness loss of dermis presenting as a shallow open injury with a red, pink wound bed without slough. (Active)  ?05/25/21 0800  ?Location: Foot  ?Location Orientation: Right (metatarsal heads)  ?Staging: Stage 2 -  Partial thickness  loss of dermis presenting as a shallow open injury with a red, pink wound bed without slough. (metatarsal heads)  ?Wound Description (Comments):   ?Present on Admission: No  ?Dressing Type Foam - Lift dressing to assess site e

## 2021-07-06 NOTE — Progress Notes (Signed)
?   07/06/21 0800  ?Assess: MEWS Score  ?Temp 98.7 ?F (37.1 ?C)  ?BP 99/73  ?Pulse Rate 82  ?ECG Heart Rate 83  ?Level of Consciousness Responds to Voice  ?SpO2 91 %  ?O2 Device Tracheostomy Collar  ?O2 Flow Rate (L/min) 5 L/min  ?FiO2 (%) 28 %  ?Assess: MEWS Score  ?MEWS Temp 0  ?MEWS Systolic 1  ?MEWS Pulse 0  ?MEWS RR 1  ?MEWS LOC 1  ?MEWS Score 3  ?MEWS Score Color Yellow  ?Assess: if the MEWS score is Yellow or Red  ?Were vital signs taken at a resting state? No  ?Focused Assessment No change from prior assessment  ?Early Detection of Sepsis Score *See Row Information* Low  ?MEWS guidelines implemented *See Row Information* No, other (Comment)  ?Treat  ?MEWS Interventions Administered scheduled meds/treatments  ?Pain Scale Faces  ?Pain Score 0  ?Take Vital Signs  ?Increase Vital Sign Frequency  Yellow: Q 2hr X 2 then Q 4hr X 2, if remains yellow, continue Q 4hrs  ?Escalate  ?MEWS: Escalate Yellow: discuss with charge nurse/RN and consider discussing with provider and RRT  ?Notify: Charge Nurse/RN  ?Name of Charge Nurse/RN Notified Adron Bene  ?Date Charge Nurse/RN Notified 07/06/21  ?Time Charge Nurse/RN Notified 0800  ?Document  ?Patient Outcome Not stable and remains on department  ? ?Chronic Yellow  ?

## 2021-07-07 LAB — BASIC METABOLIC PANEL
Anion gap: 9 (ref 5–15)
BUN: 39 mg/dL — ABNORMAL HIGH (ref 6–20)
CO2: 28 mmol/L (ref 22–32)
Calcium: 9.2 mg/dL (ref 8.9–10.3)
Chloride: 97 mmol/L — ABNORMAL LOW (ref 98–111)
Creatinine, Ser: 0.65 mg/dL (ref 0.61–1.24)
GFR, Estimated: >60 mL/min (ref >60)
Glucose, Bld: 111 mg/dL — ABNORMAL HIGH (ref 70–99)
Potassium: 4.2 mmol/L (ref 3.5–5.1)
Sodium: 134 mmol/L — ABNORMAL LOW (ref 135–145)

## 2021-07-07 LAB — CBC
HCT: 38.1 % — ABNORMAL LOW (ref 39.0–52.0)
Hemoglobin: 12.3 g/dL — ABNORMAL LOW (ref 13.0–17.0)
MCH: 30.9 pg (ref 26.0–34.0)
MCHC: 32.3 g/dL (ref 30.0–36.0)
MCV: 95.7 fL (ref 80.0–100.0)
Platelets: 184 10*3/uL (ref 150–400)
RBC: 3.98 MIL/uL — ABNORMAL LOW (ref 4.22–5.81)
RDW: 13.5 % (ref 11.5–15.5)
WBC: 7.1 10*3/uL (ref 4.0–10.5)
nRBC: 0 % (ref 0.0–0.2)

## 2021-07-07 MED ORDER — HYDRALAZINE HCL 20 MG/ML IJ SOLN
10.0000 mg | Freq: Four times a day (QID) | INTRAMUSCULAR | Status: DC | PRN
  Administered 2021-07-07 – 2021-08-13 (×3): 10 mg via INTRAVENOUS
  Filled 2021-07-07 (×5): qty 1

## 2021-07-07 MED ORDER — GLYCOPYRROLATE 1 MG PO TABS
1.0000 mg | ORAL_TABLET | Freq: Three times a day (TID) | ORAL | Status: DC
  Administered 2021-07-07 – 2021-07-12 (×16): 1 mg via ORAL
  Filled 2021-07-07 (×16): qty 1

## 2021-07-07 MED ORDER — HYDRALAZINE HCL 25 MG PO TABS
25.0000 mg | ORAL_TABLET | Freq: Three times a day (TID) | ORAL | Status: DC
  Administered 2021-07-07 – 2021-07-10 (×9): 25 mg via ORAL
  Filled 2021-07-07 (×9): qty 1

## 2021-07-07 NOTE — Progress Notes (Signed)
?   07/06/21 2130  ?Assess: MEWS Score  ?Level of Consciousness Responds to Pain ?(and family)  ?Assess: MEWS Score  ?MEWS Temp 0  ?MEWS Systolic 0  ?MEWS Pulse 1  ?MEWS RR 1  ?MEWS LOC 2  ?MEWS Score 4  ?MEWS Score Color Red  ?Assess: if the MEWS score is Yellow or Red  ?Were vital signs taken at a resting state? Yes  ?Focused Assessment No change from prior assessment  ?Early Detection of Sepsis Score *See Row Information* Low  ?MEWS guidelines implemented *See Row Information* No, other (Comment) ?(hx of anoxic brain injury, which is his normal)  ?Document  ?Patient Outcome Stabilized after interventions  ?Progress note created (see row info) Yes  ? ? ?

## 2021-07-07 NOTE — Progress Notes (Signed)
?   07/07/21 0716  ?Assess: MEWS Score  ?Temp 98.1 ?F (36.7 ?C)  ?BP (!) 153/107  ?Pulse Rate 95  ?ECG Heart Rate 95  ?Resp 20  ?SpO2 97 %  ?O2 Device Tracheostomy Collar  ?Assess: MEWS Score  ?MEWS Temp 0  ?MEWS Systolic 0  ?MEWS Pulse 0  ?MEWS RR 0  ?MEWS LOC 2  ?MEWS Score 2  ?MEWS Score Color Yellow  ?Assess: if the MEWS score is Yellow or Red  ?Were vital signs taken at a resting state? Yes  ?Focused Assessment No change from prior assessment  ?Early Detection of Sepsis Score *See Row Information* Low  ?MEWS guidelines implemented *See Row Information* No, previously yellow, continue vital signs every 4 hours  ?Document  ?Patient Outcome Stabilized after interventions  ?Progress note created (see row info) Yes  ? ? ?

## 2021-07-07 NOTE — TOC Progression Note (Signed)
Transition of Care (TOC) - Progression Note  ? ? ?Patient Details  ?Name: Billy Cherry ?MRN: [id number] ?Date of Birth: [birth year] ? ?Transition of Care (TOC) CM/SW Contact  ?Janae Bridgeman, RN ?Phone Number: ?07/07/2021, 8:01 AM ? ?Clinical Narrative:    ?CM spoke with Jeanmarie Plant, Director of [employer] and he states that Billy Cherry is hiring a full-time nurse beginning Monday, 07/10/21 to likely provide the 12 hour shifts that are needed at the patient's home.  The agency will follow up regarding start of care once services have been transferred from [facility] to [employer].  The patient's wife has requested 12 hours shifts to provide care in the home while she is working away from home during the day as a Lawyer at [employer] and [employer]. ? ?CM and MSW with DTP Team will continue to follow the patient for discharge planning - no discharge date has been determined at this time. ? ? ?Expected Discharge Plan: Home w Home Health Services ?Barriers to Discharge: Other (must enter comment) (Patient's wife - requested private duty services through alternative provider for 12 hours shifts for staffing at the home.) ? ?Expected Discharge Plan and Services ?Expected Discharge Plan: Home w Home Health Services ?In-house Referral: Clinical Social Work ?Discharge Planning Services: CM Consult, Follow-up appt scheduled ?Post Acute Care Choice: Home Health ?Living arrangements for the past 2 months: Single Family Home ?                ?DME Arranged: Trach supplies, Tube feeding, Suction, Hospital bed, Oxygen ?DME Agency: [employer] ?  ?  ?  ?HH Arranged: Charity fundraiser Designer, fashion/clothing through [employer]) ?HH Agency: [employer] ?Date HH Agency Contacted: 07/04/21 ?Time HH Agency Contacted: 1000 ?Representative spoke with at [facility] Agency: Jeanmarie Plant, Director with [employer] [phone] ? ? ?Social Determinants of Health (SDOH)  Interventions ?  ? ?Readmission Risk Interventions ? ?  04/14/2021  ? 11:14 AM  ?Readmission Risk Prevention Plan  ?Transportation Screening Complete  ?HRI or Home Care Consult Complete  ?Social Work Consult for Recovery Care Planning/Counseling Complete  ?Palliative Care Screening Complete  ?Medication Review Oceanographer) Complete  ? ? ?

## 2021-07-07 NOTE — Progress Notes (Signed)
?PROGRESS NOTE ? ? ? ?Billy Cherry  ZYS:[id number] DOB: 1970/07/20 DOA: 03/30/2021 ?PCP: Patient, No Pcp Per (Inactive)  ? ?Brief Narrative:  ?51 years old male with past medical history of hypertension was brought into the hospital after being found unresponsive by his wife.  Out of hospital V-fib arrest, received CPR, 1 shock with defibrillator, 2 doses of epinephrine with ROSC, EKG noted anterolateral STEMI ?1/19 admission, left heart cath without significant CAD.  Concern for anoxic brain injury ?1/21: MRI shows areas of hypoxic-ischemic injury bilaterally.  ?1/23 severe brain damage ?1/25: PEG . Neuro exam unchanged.  Does exhibit new eyelid tremor like movements and lip/jaw tremor like vs clonus movement.  Bolused Keppra and increased maintenance dose.  Repeat EEG. CTH: concerning for progression of anoxic injury.  ?1/26: Repeat EEG unchanged and continues to show myoclonic seizures despite addition of Valproic acid. transfered to [name] from [facility] for continuous EEG ?1/28: Continues to have myoclonic sz on EEG ?2/3 no meaningful neuro recovery.  ?2/6 New fever to 102, WBC 16K - treated with vancomycin for 7d based on cultures.  ?2/11: percutaneous tracheostomy ?2/13 >2/20 on trach collar. No vent.  ?2/20 Trach change to 6 cuffless ?2/28: fever ?3/13: eliquis started ?4/3: fever ?06/06/2021 CT of the chest with new PE and haziness consistent with pneumonia ?4/5-4/9: responding well to PRN lasix ?4/11 fever again after completion of abx- work up in progress ?4/13 continues to have low-grade temps cultures from 4/11 no growth to date-suspect silent/occult aspiration-heparin changed back to Eliquis 4/13 ?4/14: Work-up negative with no growth to date but red menance Tmax elevated 99.4 and respiratory rate up-x-ray did not show any pneumonia but probably silently aspirating ? ? ?04/24 patient has been clinically stable, tolerating tube feedings and managing trach secretions, no increase in 02  requirements. ?Continue not interactive and poor prognosis.  ?04/25 no clinical sings of infection.  ? ? ?Now remains stable medically, awaiting safe disposition, home care/nursing arrangement, TOC/LLOS following ? ? ?Plan to discharge home with home health services ? ?  ?Subjective : ?The patient was seen and examined this morning, no significant changes only blinks to verbal compensation ?Otherwise nonverbal does not move any of his extremities ? ?Reviewing overnight blood pressure was elevated ?As needed hydralazine, and scheduled hydralazine was started 07/07/2021 ? ? ?assessment and Plan: ? ?Cardiac arrest ([name])/ Postarrest anoxic encephalopathy / Postarrest respiratory failure s/p tracheostomy, S/P PEG tube placement ?Postarrest myoclonus: ?No changes stable ? ?2D echocardiogram showed reduced LV function at <20%.  Now status post trach and PEG.   ?Remains obtunded with anoxic encephalopathy-on Keppra, Klonopin for breakthrough myoclonus.   ?At this time, plan is home with home health ?-Wife has reportedly completed education for trach and PEG tube ?-Enteral tube feeding trach kit and supplies to be arranged. PCCM managing tracheostomy. ?-Seen by palliative care multiple times this admission, patient's wife continues to want full code and full scope of treatment ?-Plan for discharge home with extensive home health services, nursing care being arranged by [facility], possibly later this week ? 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   ?Patient currently keeps his eyes open, but does not answer questions or follow commands ?-No changes ? ?Acute respiratory failure ([place]) ?Status post trach collar.  Continue supportive care ?-On 5 L, satting 95% ? ?RLL pneumonia ?Previously had completed vancomycin for MSSA and [name] in early march.  Received more than 7-day course of IV Fortaz. ?On 4/3, CT chest was ordered that indicated an  area of alveolar infiltration that may represent pneumonia.   Tracheal aspirate has been sent for culture and is significant for normal respiratory flora. Concerning for aspiration. ?-s/p Unasyn x 7 days ?-Remains stable off antibiotics, intermittent low-grade fevers, continue pulmonary toilet ?-Stable ? ?PE/DVT (deep venous thrombosis) ([place]) ?-CTA chest 4/4 noted bilateral PE ?-Duplex ultrasound of lower extremity on 05/15/2021 showed age indeterminate deep vein thrombosis involving the left femoral vein, left proximal profunda vein, left posterior tibial veins, left popliteal vein, and left peroneal veins.  ?-Was on Eliquis initially, briefly treated with Lovenox and then back on Eliquis now ?-Continue Eliquis ?-repeated duplex (due to fever) and clot has not propagated.   ? ?Dysphagia ?Continue PEG tube feeding.  Tolerating but high risk of aspiration ?-Aspiration precautions ? ?Pressure injury of skin ?posterior right heel, right foot. Not present on admission. ? ?HTN (hypertension) ?-Continue Coreg, Cardura,  isosorbide dinitrate 30 mg TID ?Added scheduled hydralazine along with as needed labetalol ? ?HFrEF (heart failure with reduced ejection fraction) ([place]) ?2D echocardiogram-LVEF < 20%,  ?-Continue coreg, imdur, spironolactone ?-Has not required any diuretics in the last 2 months, overall prognosis is very poor ? ?Myoclonus ?Seen by neurology.  Continue current AED with Klonopin, Keppra.  ? ?Hypernatremia-resolved as of 06/09/2021 ? ?Hyperkalemia ?-resolved ? ?Overall poor prognosis ?Goals of care discussions ?-Remains unresponsive with trach, PEG, pressure wounds now ?-Seen by palliative care multiple times this admission, patient's wife continues to want full code and full scope of treatment ? ? ?DVT prophylaxis: apixaban (ELIQUIS) tablet 5 mg  ?  Code Status: Full Code ?Family Communication: No family at bedside, updated wife 5/1 ? ?Disposition Plan: Home with home health services once arrangements completed by [facility] ?  ? ?Consultants:  ?PCCM ?Neurology ?Palliative  care ?ID ?IR ?GI ?cards ? ? ?Subjective: ?-No events overnight ? ?Objective: ?Vitals:  ? 07/07/21 0716 07/07/21 0830 07/07/21 1038 07/07/21 1053  ?BP: (!) 153/107  (!) 126/97 (!) 139/103  ?Pulse: 95 98 (!) 103 (!) 103  ?Resp: 20  (!) 28 20  ?Temp: 98.1 ?F (36.7 ?C)  97.8 ?F (36.6 ?C) 97.8 ?F (36.6 ?C)  ?TempSrc: Oral  Oral Oral  ?SpO2: 97% 95% 98% 99%  ?Weight:      ?Height:      ? ? ?Intake/Output Summary (Last 24 hours) at 07/07/2021 1343 ?Last data filed at 07/06/2021 1757 ?Gross per 24 hour  ?Intake 655 ml  ?Output 300 ml  ?Net 355 ml  ? ?Filed Weights  ? 07/05/21 0515 07/06/21 0500 07/07/21 0010  ?Weight: 82 kg 91 kg 91 kg  ? ? ? ? ?Physical Exam: ?  ?General:  Chronically ill male, nonverbal, bedbound, eyes open blinks to respond to command  ?HEENT:  Tracheostomy, oxygen via trach, eyes open  ?Neuro:  Limited exam, negative for any reflux, does not move any of his extremities ?Does not respond to pain stimuli  ?Lungs:   On oxygen via tracheostomy, diminished air sounds in right lower lobe ? respirations unlabored,  ?No wheezes / crackles  ?Cardio:    S1/S2, RRR, No murmure, No Rubs or Gallops   ?Abdomen:  PEG tube in place, functional ?Soft, non-tender, bowel sounds active all four quadrants, ?no guarding or peritoneal signs.  ?Muscular  ?skeletal:  Limited exam -severe global generalized weaknesses bedbound  ?2+ pulses,  symmetric, +2 pitting edema  ?Skin:  Dry, warm to touch, negative for any Rashes,  ?Wounds: Please see nursing documentation ? ?Pressure Injury 05/25/21 Heel Posterior;Right Deep Tissue  Pressure Injury - Purple or maroon localized area of discolored intact skin or blood-filled blister due to damage of underlying soft tissue from pressure and/or shear. (Active)  ?05/25/21 0800  ?Location: Heel  ?Location Orientation: Posterior;Right  ?Staging: Deep Tissue Pressure Injury - Purple or maroon localized area of discolored intact skin or blood-filled blister due to damage of underlying soft tissue from  pressure and/or shear.  ?Wound Description (Comments):   ?Present on Admission: No  ?Dressing Type Foam - Lift dressing to assess site every shift 07/07/21 0915  ?   ?Pressure Injury 05/25/21 Foot Right Stage 2 -  Partial th

## 2021-07-07 NOTE — Progress Notes (Signed)
?   07/07/21 0400  ?Assess: MEWS Score  ?BP (!) 144/107  ?Pulse Rate 96  ?Resp (!) 24  ?SpO2 97 %  ?O2 Device Tracheostomy Collar  ?FiO2 (%) 28 %  ?Assess: MEWS Score  ?MEWS Temp 0  ?MEWS Systolic 0  ?MEWS Pulse 0  ?MEWS RR 1  ?MEWS LOC 2  ?MEWS Score 3  ?MEWS Score Color Yellow  ?Assess: if the MEWS score is Yellow or Red  ?Were vital signs taken at a resting state? Yes  ?Focused Assessment No change from prior assessment  ?Early Detection of Sepsis Score *See Row Information* Low  ?MEWS guidelines implemented *See Row Information* No, previously yellow, continue vital signs every 4 hours  ?Document  ?Patient Outcome Stabilized after interventions  ?Progress note created (see row info) Yes  ? ? ?

## 2021-07-07 NOTE — Progress Notes (Signed)
?   07/07/21 0230  ?Assess: MEWS Score  ?BP (!) 122/97  ?Pulse Rate (!) 102  ?Resp 20  ?SpO2 100 %  ?O2 Device Tracheostomy Collar  ?FiO2 (%) 28 %  ?Assess: MEWS Score  ?MEWS Temp 0  ?MEWS Systolic 0  ?MEWS Pulse 1  ?MEWS RR 0  ?MEWS LOC 2  ?MEWS Score 3  ?MEWS Score Color Yellow  ?Assess: if the MEWS score is Yellow or Red  ?Were vital signs taken at a resting state? Yes  ?Focused Assessment No change from prior assessment  ?Early Detection of Sepsis Score *See Row Information* Low  ?MEWS guidelines implemented *See Row Information* No, previously yellow, continue vital signs every 4 hours  ?Document  ?Patient Outcome Stabilized after interventions  ?Progress note created (see row info) Yes  ? ? ?

## 2021-07-07 NOTE — Progress Notes (Signed)
?   07/06/21 2021  ?Assess: MEWS Score  ?Temp 98.2 ?F (36.8 ?C)  ?BP (!) 135/97  ?Pulse Rate (!) 108  ?ECG Heart Rate (!) 105  ?Resp 13  ?SpO2 94 %  ?O2 Device Tracheostomy Collar  ?Assess: MEWS Score  ?MEWS Temp 0  ?MEWS Systolic 0  ?MEWS Pulse 1  ?MEWS RR 1  ?MEWS LOC 1  ?MEWS Score 3  ?MEWS Score Color Yellow  ?Assess: if the MEWS score is Yellow or Red  ?Were vital signs taken at a resting state? Yes  ?Focused Assessment No change from prior assessment  ?Early Detection of Sepsis Score *See Row Information* Low  ?MEWS guidelines implemented *See Row Information* No, previously yellow, continue vital signs every 4 hours  ?Document  ?Patient Outcome Stabilized after interventions  ?Progress note created (see row info) Yes  ? ? ?

## 2021-07-07 NOTE — Progress Notes (Signed)
?   07/07/21 0512  ?Assess: MEWS Score  ?Temp 98.5 ?F (36.9 ?C)  ?BP (!) 160/110  ?Pulse Rate 98  ?Resp (!) 24  ?SpO2 96 %  ?Assess: MEWS Score  ?MEWS Temp 0  ?MEWS Systolic 0  ?MEWS Pulse 0  ?MEWS RR 1  ?MEWS LOC 2  ?MEWS Score 3  ?MEWS Score Color Yellow  ?Assess: if the MEWS score is Yellow or Red  ?Were vital signs taken at a resting state? Yes  ?Focused Assessment No change from prior assessment  ?Early Detection of Sepsis Score *See Row Information* Low  ?MEWS guidelines implemented *See Row Information* No, previously yellow, continue vital signs every 4 hours  ?Document  ?Patient Outcome Stabilized after interventions  ?Progress note created (see row info) Yes  ? ? ?

## 2021-07-08 NOTE — Plan of Care (Signed)
?  Problem: Clinical Measurements: Goal: Will remain free from infection Outcome: Progressing Goal: Diagnostic test results will improve Outcome: Progressing Goal: Respiratory complications will improve Outcome: Progressing   Problem: Coping: Goal: Level of anxiety will decrease Outcome: Progressing   Problem: Elimination: Goal: Will not experience complications related to bowel motility Outcome: Progressing Goal: Will not experience complications related to urinary retention Outcome: Progressing   

## 2021-07-08 NOTE — Progress Notes (Signed)
?PROGRESS NOTE ? ? ? ?Billy Cherry  HWE:[id number] DOB: 1970-12-31 DOA: 03/30/2021 ?PCP: Patient, No Pcp Per (Inactive)  ? ?Brief Narrative:  ?51 years old male with past medical history of hypertension was brought into the hospital after being found unresponsive by his wife.  Out of hospital V-fib arrest, received CPR, 1 shock with defibrillator, 2 doses of epinephrine with ROSC, EKG noted anterolateral STEMI ?1/19 admission, left heart cath without significant CAD.  Concern for anoxic brain injury ?1/21: MRI shows areas of hypoxic-ischemic injury bilaterally.  ?1/23 severe brain damage ?1/25: PEG . Neuro exam unchanged.  Does exhibit new eyelid tremor like movements and lip/jaw tremor like vs clonus movement.  Bolused Keppra and increased maintenance dose.  Repeat EEG. CTH: concerning for progression of anoxic injury.  ?1/26: Repeat EEG unchanged and continues to show myoclonic seizures despite addition of Valproic acid. transfered to [name] from [facility] for continuous EEG ?1/28: Continues to have myoclonic sz on EEG ?2/3 no meaningful neuro recovery.  ?2/6 New fever to 102, WBC 16K - treated with vancomycin for 7d based on cultures.  ?2/11: percutaneous tracheostomy ?2/13 >2/20 on trach collar. No vent.  ?2/20 Trach change to 6 cuffless ?2/28: fever ?3/13: eliquis started ?4/3: fever ?06/06/2021 CT of the chest with new PE and haziness consistent with pneumonia ?4/5-4/9: responding well to PRN lasix ?4/11 fever again after completion of abx- work up in progress ?4/13 continues to have low-grade temps cultures from 4/11 no growth to date-suspect silent/occult aspiration-heparin changed back to Eliquis 4/13 ? ? ?04/24 patient has been clinically stable, tolerating tube feedings and managing trach secretions, no increase in 02 requirements. ?Continue not interactive and poor prognosis.  ?04/25 no clinical sings of infection.  ? ?Plan to discharge home with home health services ? ?  ?Subjective : ?Patient seen and  examined this morning.  Remains nonverbal.  Does open his eyes occasionally.   ? ? ?Assessment and Plan: ? ?Cardiac arrest ([name])/ Postarrest anoxic encephalopathy / Postarrest respiratory failure s/p tracheostomy, S/P PEG tube placement ?Postarrest myoclonus: ?2D echocardiogram showed reduced LV function at <20%.  Now status post trach and PEG.   ?Remains obtunded with anoxic encephalopathy-on Keppra, Klonopin for breakthrough myoclonus.   ?At this time, plan is home with home health ?-Wife has reportedly completed education for trach and PEG tube ?-Enteral tube feeding trach kit and supplies to be arranged. PCCM managing tracheostomy. ?-Seen by palliative care multiple times this admission, patient's wife continues to want full code and full scope of treatment ?-Plan for discharge home with extensive home health services, nursing care being arranged by [facility], possibly later this week ?Noted to be stable for the most part.  Noted to be afebrile. ? 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   ?Patient currently keeps his eyes open, but does not answer questions or follow commands ? ?Acute respiratory failure ([place]) ?Status post trach collar.  Continue supportive care ?-On 5 L, satting 95% ? ?RLL pneumonia ?Previously had completed vancomycin for MSSA and [name] in early march.  Received more than 7-day course of IV Fortaz. ?On 4/3, CT chest was ordered that indicated an area of alveolar infiltration that may represent pneumonia.  Tracheal aspirate has been sent for culture and is significant for normal respiratory flora. Concerning for aspiration. ?-s/p Unasyn x 7 days ?-Remains stable off antibiotics, intermittent low-grade fevers, continue pulmonary toilet ?-Stable ? ?PE/DVT (deep venous thrombosis) ([place]) ?-CTA chest 4/4 noted bilateral PE ?-Duplex ultrasound of lower extremity  on 05/15/2021 showed age indeterminate deep vein thrombosis involving the left femoral vein, left proximal  profunda vein, left posterior tibial veins, left popliteal vein, and left peroneal veins.  ?-Was on Eliquis initially, briefly treated with Lovenox and then back on Eliquis now ?-Continue Eliquis ?-repeated duplex (due to fever) and clot has not propagated.   ? ?Dysphagia ?Continue PEG tube feeding.  ?-Aspiration precautions ? ?Pressure injury of skin ?Posterior right heel, right foot. Not present on admission. ? ?HTN (hypertension) ?-Continue Coreg, Cardura,  isosorbide dinitrate, hydralazine.  Blood pressure reasonably well controlled. ? ?HFrEF (heart failure with reduced ejection fraction) ([place]) ?2D echocardiogram-LVEF < 20%,  ?-Continue coreg, imdur, spironolactone ?-Has not required any diuretics in the last 2 months. ? ?Myoclonus ?Seen by neurology.  Continue current AED with Klonopin, Keppra.  ? ?Hypernatremia-resolved as of 06/09/2021 ? ?Hyperkalemia ?-resolved ? ?Overall poor prognosis ?Goals of care discussions ?-Remains unresponsive with trach, PEG, pressure wounds now ?-Seen by palliative care multiple times this admission, patient's wife continues to want full code and full scope of treatment ? ? ?DVT prophylaxis: apixaban (ELIQUIS) tablet 5 mg  ?  Code Status: Full Code ?Family Communication: No family at bedside. ?Disposition Plan: Home with home health services once arrangements completed by [facility] ?  ? ?Consultants:  ?PCCM ?Neurology ?Palliative care ?ID ?IR ?GI ?cards ? ?Objective: ?Vitals:  ? 07/08/21 0400 07/08/21 0425 07/08/21 0800 07/08/21 0821  ?BP: 118/89  (!) 139/107   ?Pulse: 88  92 92  ?Resp: $Remov'20  18 20  'xvFwOm$ ?Temp: 98.7 ?F (37.1 ?C)  99.1 ?F (37.3 ?C)   ?TempSrc: Axillary  Axillary   ?SpO2: 99%  98% 99%  ?Weight:  94 kg    ?Height:      ? ? ?Intake/Output Summary (Last 24 hours) at 07/08/2021 1132 ?Last data filed at 07/08/2021 0600 ?Gross per 24 hour  ?Intake 4464.25 ml  ?Output 1450 ml  ?Net 3014.25 ml  ? ? ?Filed Weights  ? 07/06/21 0500 07/07/21 0010 07/08/21 0425  ?Weight: 91 kg 91 kg 94 kg   ? ? ?General appearance: Opens his eyes but does not communicate. ?Tracheostomy is noted ?Resp: Coarse breath sounds bilaterally no wheezing or rhonchi ?Cardio: S1-S2 is normal regular.  No S3-S4.  No rubs murmurs or bruit ?GI: Abdomen is soft.  PEG tube is noted. ?Extremities: Mild edema bilateral lower extremity ? ? ?Wounds: Please see nursing documentation ? ?Pressure Injury 05/25/21 Heel Posterior;Right Deep Tissue Pressure Injury - Purple or maroon localized area of discolored intact skin or blood-filled blister due to damage of underlying soft tissue from pressure and/or shear. (Active)  ?05/25/21 0800  ?Location: Heel  ?Location Orientation: Posterior;Right  ?Staging: Deep Tissue Pressure Injury - Purple or maroon localized area of discolored intact skin or blood-filled blister due to damage of underlying soft tissue from pressure and/or shear.  ?Wound Description (Comments):   ?Present on Admission: No  ?Dressing Type Foam - Lift dressing to assess site every shift 07/07/21 2000  ?   ?Pressure Injury 05/25/21 Foot Right Stage 2 -  Partial thickness loss of dermis presenting as a shallow open injury with a red, pink wound bed without slough. (Active)  ?05/25/21 0800  ?Location: Foot  ?Location Orientation: Right (metatarsal heads)  ?Staging: Stage 2 -  Partial thickness loss of dermis presenting as a shallow open injury with a red, pink wound bed without slough. (metatarsal heads)  ?Wound Description (Comments):   ?Present on Admission: No  ?Dressing Type Foam - Lift  dressing to assess site every shift;Impregnated gauze (bismuth) 07/07/21 0915  ?  ? ? ?  ? ? ?  ? ?Data Reviewed: I have personally reviewed following labs and imaging studies ? ?CBC: ?Recent Labs  ?Lab 07/03/21 ?0152 07/07/21 ?0315  ?WBC 7.8 7.1  ?HGB 11.9* 12.3*  ?HCT 36.7* 38.1*  ?MCV 96.3 95.7  ?PLT 242 184  ? ? ?Basic Metabolic Panel: ?Recent Labs  ?Lab 07/03/21 ?0152 07/07/21 ?0315  ?NA 136 134*  ?K 4.9 4.2  ?CL 100 97*  ?CO2 29 28   ?GLUCOSE 100* 111*  ?BUN 31* 39*  ?CREATININE 0.64 0.65  ?CALCIUM 9.0 9.2  ? ? ?GFR: ?Estimated Creatinine Clearance: 127.2 mL/min (by C-G formula based on SCr of 0.65 mg/dL). ? ? ? ?Radiology Studies: ?No results foun

## 2021-07-09 NOTE — Progress Notes (Signed)
?PROGRESS NOTE ? ? ? ?Billy Cherry  INO:[id number] DOB: 02-Oct-1970 DOA: 03/30/2021 ?PCP: Patient, No Pcp Per (Inactive)  ? ? ?Brief Narrative:  ?51 years old male with past medical history of hypertension was brought into the hospital after being found unresponsive by his wife.  Out of hospital V-fib arrest, received CPR, 1 shock with defibrillator, 2 doses of epinephrine with ROSC, EKG noted anterolateral STEMI ?1/19 admission, left heart cath without significant CAD.  Concern for anoxic brain injury ?1/21: MRI shows areas of hypoxic-ischemic injury bilaterally.  ?1/23 severe brain damage ?1/25: PEG . Neuro exam unchanged.  Does exhibit new eyelid tremor like movements and lip/jaw tremor like vs clonus movement.  Bolused Keppra and increased maintenance dose.  Repeat EEG. CTH: concerning for progression of anoxic injury.  ?1/26: Repeat EEG unchanged and continues to show myoclonic seizures despite addition of Valproic acid. transfered to [name] from [facility] for continuous EEG ?1/28: Continues to have myoclonic sz on EEG ?2/6 New fever to 102, WBC 16K - treated with vancomycin for 7d based on cultures.  ?2/11: percutaneous tracheostomy ?2/20 Trach change to 6 cuffless ?3/13: eliquis started ?06/06/2021 CT of the chest with new PE and haziness consistent with pneumonia ? ? ?Subjective : ?Patient seen and examined this morning.  Remains nonverbal.  Eyes are open. ? ? ?Assessment and Plan: ? ?Cardiac arrest ([name])/ Postarrest anoxic encephalopathy / Postarrest respiratory failure s/p tracheostomy, S/P PEG tube placement ?Postarrest myoclonus: ?2D echocardiogram showed reduced LV function at <20%.  Now status post trach and PEG.   ?Remains obtunded with anoxic encephalopathy-on Keppra, Klonopin for breakthrough myoclonus.   ?At this time, plan is home with home health ?-Wife has reportedly completed education for trach and PEG tube ?-Enteral tube feeding trach kit and supplies to be arranged. PCCM managing  tracheostomy. ?-Seen by palliative care multiple times this admission, patient's wife continues to want full code and full scope of treatment ?-Plan for discharge home with extensive home health services, nursing care being arranged by [facility]. ?Patient remains stable.  Afebrile.  Will order labs for tomorrow. ? 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  Continue supportive care. ? ?Acute respiratory failure ([place]) ?Status post trach collar.  Continue supportive care ?-On 5 L, satting 95% ? ?RLL pneumonia ?Previously had completed vancomycin for MSSA and [name] in early march.  Received more than 7-day course of IV Fortaz. ?On 4/3, CT chest was ordered that indicated an area of alveolar infiltration that may represent pneumonia.  Tracheal aspirate has been sent for culture and is significant for normal respiratory flora. Concerning for aspiration. ?-s/p Unasyn x 7 days ?Remains stable.  Continue to monitor. ? ?PE/DVT (deep venous thrombosis) ([place]) ?-CTA chest 4/4 noted bilateral PE ?-Duplex ultrasound of lower extremity on 05/15/2021 showed age indeterminate deep vein thrombosis involving the left femoral vein, left proximal profunda vein, left posterior tibial veins, left popliteal vein, and left peroneal veins.  ?-Was on Eliquis initially, briefly treated with Lovenox and then back on Eliquis now ?-Continue Eliquis ?-repeated duplex (due to fever) and clot has not propagated.   ? ?Dysphagia ?Continue PEG tube feeding.  ?-Aspiration precautions ? ?Pressure injury of skin ?Posterior right heel, right foot. Not present on admission. ? ?HTN (hypertension) ?-Continue Coreg, Cardura,  isosorbide dinitrate, hydralazine.  Blood pressure reasonably well controlled. ? ?HFrEF (heart failure with reduced ejection fraction) ([place]) ?2D echocardiogram-LVEF < 20%,  ?-Continue coreg, imdur, spironolactone ?-Has required furosemide intermittently for volume overload.  Seems to be  stable currently.    ? ?Myoclonus ?Seen by neurology.  Continue current AED with Klonopin, Keppra.  ? ?Hypernatremia ?Resolved as of 06/09/2021 ? ?Hyperkalemia ?Resolved ? ?Overall poor prognosis ?Goals of care discussions ?-Remains unresponsive with trach, PEG, pressure wounds now ?-Seen by palliative care multiple times this admission, patient's wife continues to want full code and full scope of treatment ? ? ?DVT prophylaxis: apixaban (ELIQUIS) tablet 5 mg  ?  Code Status: Full Code ?Family Communication: No family at bedside. ?Disposition Plan: Home with home health services once arrangements completed by [facility] ?  ? ?Consultants:  ?PCCM ?Neurology ?Palliative care ?ID ?IR ?GI ?cards ? ?Objective: ?Vitals:  ? 07/09/21 0025 07/09/21 0240 07/09/21 0534 07/09/21 0759  ?BP: (!) 120/98  (!) 122/103 (!) 139/104  ?Pulse: (!) 104 94 91 85  ?Resp: (!) 23 (!) $Remo'22 18 19  'kCTez$ ?Temp: 98.9 ?F (37.2 ?C)  98.5 ?F (36.9 ?C) 99.2 ?F (37.3 ?C)  ?TempSrc: Oral  Oral Oral  ?SpO2: 98% 100% 97% 98%  ?Weight:   94 kg   ?Height:      ? ? ?Intake/Output Summary (Last 24 hours) at 07/09/2021 1011 ?Last data filed at 07/09/2021 0400 ?Gross per 24 hour  ?Intake 555 ml  ?Output 2150 ml  ?Net -1595 ml  ? ? ?Filed Weights  ? 07/07/21 0010 07/08/21 0425 07/09/21 0534  ?Weight: 91 kg 94 kg 94 kg  ? ?Eyes are open but does not respond. ?Tracheostomy is noted. ?Coarse breath sounds bilaterally.  No wheezing or rhonchi. ?S1-S2 normal regular.  No S3-S4. ?Abdomen is soft.  PEG tube is noted. ? ? ?Wounds: Please see nursing documentation ? ?Pressure Injury 05/25/21 Heel Posterior;Right Deep Tissue Pressure Injury - Purple or maroon localized area of discolored intact skin or blood-filled blister due to damage of underlying soft tissue from pressure and/or shear. (Active)  ?05/25/21 0800  ?Location: Heel  ?Location Orientation: Posterior;Right  ?Staging: Deep Tissue Pressure Injury - Purple or maroon localized area of discolored intact skin or blood-filled blister due to damage of  underlying soft tissue from pressure and/or shear.  ?Wound Description (Comments):   ?Present on Admission: No  ?Dressing Type Foam - Lift dressing to assess site every shift 07/08/21 2047  ?   ?Pressure Injury 05/25/21 Foot Right Stage 2 -  Partial thickness loss of dermis presenting as a shallow open injury with a red, pink wound bed without slough. (Active)  ?05/25/21 0800  ?Location: Foot  ?Location Orientation: Right (metatarsal heads)  ?Staging: Stage 2 -  Partial thickness loss of dermis presenting as a shallow open injury with a red, pink wound bed without slough. (metatarsal heads)  ?Wound Description (Comments):   ?Present on Admission: No  ?Dressing Type Foam - Lift dressing to assess site every shift 07/08/21 2047  ?  ? ? ?  ? ? ?  ? ?Data Reviewed: I have personally reviewed following labs and imaging studies ? ?CBC: ?Recent Labs  ?Lab 07/03/21 ?0152 07/07/21 ?0315  ?WBC 7.8 7.1  ?HGB 11.9* 12.3*  ?HCT 36.7* 38.1*  ?MCV 96.3 95.7  ?PLT 242 184  ? ? ?Basic Metabolic Panel: ?Recent Labs  ?Lab 07/03/21 ?0152 07/07/21 ?0315  ?NA 136 134*  ?K 4.9 4.2  ?CL 100 97*  ?CO2 29 28  ?GLUCOSE 100* 111*  ?BUN 31* 39*  ?CREATININE 0.64 0.65  ?CALCIUM 9.0 9.2  ? ? ?GFR: ?Estimated Creatinine Clearance: 127.2 mL/min (by C-G formula based on SCr of 0.65 mg/dL). ? ? ? ?Radiology  Studies: ?No results found. ? ? ?Scheduled Meds: ? apixaban  5 mg Per Tube BID  ? atropine  2 drop Sublingual QID  ? carvedilol  25 mg Per Tube BID  ? chlorhexidine  15 mL Mouth/Throat BID  ? clonazepam  0.5 mg Per Tube TID  ? doxazosin  2 mg Per Tube Daily  ? feeding supplement (OSMOLITE 1.5 CAL)  355 mL Per Tube QID  ? feeding supplement (PROSource TF)  45 mL Per Tube BID  ? fiber  1 packet Per Tube BID  ? free water  300 mL Per Tube Q4H  ? Gerhardt's butt cream   Topical Daily  ? glycopyrrolate  1 mg Oral TID  ? hydrALAZINE  25 mg Oral Q8H  ? isosorbide dinitrate  30 mg Per Tube TID  ? levETIRAcetam  750 mg Per Tube BID  ? mouth rinse  15 mL  Mouth Rinse q12n4p  ? nutrition supplement (JUVEN)  1 packet Per Tube BID BM  ? pantoprazole sodium  40 mg Per Tube QHS  ? scopolamine  1 patch Transdermal Q72H  ? spironolactone  25 mg Per Tube Daily  ? valproic acid  750

## 2021-07-10 ENCOUNTER — Other Ambulatory Visit (HOSPITAL_COMMUNITY): Payer: Self-pay

## 2021-07-10 DIAGNOSIS — I469 Cardiac arrest, cause unspecified: Secondary | ICD-10-CM | POA: Diagnosis not present

## 2021-07-10 LAB — COMPREHENSIVE METABOLIC PANEL
ALT: 11 U/L (ref 0–44)
AST: 18 U/L (ref 15–41)
Albumin: 2.2 g/dL — ABNORMAL LOW (ref 3.5–5.0)
Alkaline Phosphatase: 56 U/L (ref 38–126)
Anion gap: 5 (ref 5–15)
BUN: 31 mg/dL — ABNORMAL HIGH (ref 6–20)
CO2: 29 mmol/L (ref 22–32)
Calcium: 9.2 mg/dL (ref 8.9–10.3)
Chloride: 100 mmol/L (ref 98–111)
Creatinine, Ser: 0.68 mg/dL (ref 0.61–1.24)
GFR, Estimated: >60 mL/min (ref >60)
Glucose, Bld: 131 mg/dL — ABNORMAL HIGH (ref 70–99)
Potassium: 4.4 mmol/L (ref 3.5–5.1)
Sodium: 134 mmol/L — ABNORMAL LOW (ref 135–145)
Total Bilirubin: 0.3 mg/dL (ref 0.3–1.2)
Total Protein: 6.3 g/dL — ABNORMAL LOW (ref 6.5–8.1)

## 2021-07-10 LAB — CBC
HCT: 37.2 % — ABNORMAL LOW (ref 39.0–52.0)
Hemoglobin: 12 g/dL — ABNORMAL LOW (ref 13.0–17.0)
MCH: 31 pg (ref 26.0–34.0)
MCHC: 32.3 g/dL (ref 30.0–36.0)
MCV: 96.1 fL (ref 80.0–100.0)
Platelets: 220 10*3/uL (ref 150–400)
RBC: 3.87 MIL/uL — ABNORMAL LOW (ref 4.22–5.81)
RDW: 13.6 % (ref 11.5–15.5)
WBC: 5.4 10*3/uL (ref 4.0–10.5)
nRBC: 0 % (ref 0.0–0.2)

## 2021-07-10 MED ORDER — HYDRALAZINE HCL 50 MG PO TABS
50.0000 mg | ORAL_TABLET | Freq: Three times a day (TID) | ORAL | Status: DC
  Administered 2021-07-10 – 2021-07-12 (×6): 50 mg via ORAL
  Filled 2021-07-10 (×6): qty 1

## 2021-07-10 NOTE — Progress Notes (Signed)
?PROGRESS NOTE ? ? ? ?Billy Cherry  HER:[id number] DOB: [birth year] DOA: 03/30/2021 ?PCP: Patient, No Pcp Per (Inactive)  ? ? ?Brief Narrative:  ?51 years old male with past medical history of hypertension was brought into the hospital after being found unresponsive by his wife.  Out of hospital V-fib arrest, received CPR, 1 shock with defibrillator, 2 doses of epinephrine with ROSC, EKG noted anterolateral STEMI ?1/19 admission, left heart cath without significant CAD.  Concern for anoxic brain injury ?1/21: MRI shows areas of hypoxic-ischemic injury bilaterally.  ?1/23 severe brain damage ?1/25: PEG . Neuro exam unchanged.  Does exhibit new eyelid tremor like movements and lip/jaw tremor like vs clonus movement.  Bolused Keppra and increased maintenance dose.  Repeat EEG. CTH: concerning for progression of anoxic injury.  ?1/26: Repeat EEG unchanged and continues to show myoclonic seizures despite addition of Valproic acid. [name] for continuous EEG ?1/28: Continues to have myoclonic sz on EEG ?2/6 New fever to 102, WBC 16K - treated with vancomycin for 7d based on cultures.  ?2/11: percutaneous tracheostomy ?2/20 Trach change to 6 cuffless ?3/13: eliquis started ?06/06/2021 CT of the chest with new PE and haziness consistent with pneumonia ? ? ?Subjective : ?No overnight events noted, pt nonverbal and appears comfortable. ? ? ?Assessment and Plan: ? ?Cardiac arrest ([name])/ Postarrest anoxic encephalopathy / Postarrest respiratory failure s/p tracheostomy, S/P PEG tube placement ?Postarrest myoclonus: ?2D echocardiogram showed reduced LV function at <20%.  Now status post trach and PEG.   ?Remains obtunded with anoxic encephalopathy-on Keppra, Klonopin for breakthrough myoclonus.   ?At this time, plan is home with home health ?-Wife has reportedly completed education for trach and PEG tube ?-Enteral tube feeding trach kit and supplies to be arranged. PCCM managing tracheostomy. ?-Seen by  palliative care multiple times this admission, patient's wife continues to want full code and full scope of treatment. Will continue goals of care discussions.  ?-Plan for discharge home with extensive home health services, nursing care being arranged by TOC. ?Patient remains stable.  Afebrile.  Will order labs for tomorrow. ? ?Anoxic encephalopathy ([place]) ?No significant improvement.  Continue supportive care. ? ?Acute respiratory failure ([place]) ?Status post trach collar.  Continue supportive care ?-On 5 L, satting 95% ? ?RLL pneumonia ?Previously had completed vancomycin for MSSA and [name] in early march.  Received more than 7-day course of IV Fortaz. ?On 4/3, CT chest was ordered that indicated an area of alveolar infiltration that may represent pneumonia.  Tracheal aspirate was normal flora.   ?-s/p Unasyn x 7 days for suspicion of aspiration. ?Remains stable.  Continue to monitor. ? ?PE/DVT (deep venous thrombosis) ([place]) ?-CTA chest 4/4 noted bilateral PE ?-Duplex ultrasound of lower extremity on 05/15/2021 showed age indeterminate deep vein thrombosis involving the left femoral vein, left proximal profunda vein, left posterior tibial veins, left popliteal vein, and left peroneal veins.  ?-Was on Eliquis initially, briefly treated with Lovenox and then back on Eliquis now ?-Continue Eliquis ?- Repeated duplex (due to fever) and clot has not propagated.   ? ?Dysphagia ?Continue PEG tube feeding.  ?-Aspiration precautions ? ?Pressure injury of skin ?Posterior right heel, right foot. Not present on admission. ? ?HTN (hypertension) ?-Continue Coreg, Cardura, isosorbide dinitrate, hydralazine. BP up, will increase hydralazine dose starting this PM. ? ?HFrEF (heart failure with reduced ejection fraction) ([place]) ?2D echocardiogram-LVEF < 20%,  ?-Continue coreg, imdur, spironolactone ?-Has required furosemide intermittently for volume overload.  Seems to be  stable currently. Will not redose lasix at  this time. ? ?Myoclonus ?Seen by neurology.  Continue current AED with Klonopin, Keppra.  ? ?Hypernatremia ?Resolved as of 06/09/2021 ? ?Hyperkalemia ?Resolved ? ?Overall poor prognosis ?Goals of care discussions ?-Remains unresponsive with trach, PEG, pressure wounds now ?-Seen by palliative care multiple times this admission, patient's wife continues to want full code and full scope of treatment ? ? ?DVT prophylaxis: apixaban (ELIQUIS) tablet 5 mg  ?  Code Status: Full Code ?Family Communication: No family at bedside. ?Disposition Plan: Home with home health services once arrangements completed by [facility] ?  ? ?Consultants:  ?PCCM ?Neurology ?Palliative care ?ID ?IR ?GI ?cards ? ?Objective: ?Vitals:  ? 07/10/21 0342 07/10/21 0400 07/10/21 0922 07/10/21 1107  ?BP:  (!) 134/102  (!) 133/100  ?Pulse: 99 99 90 94  ?Resp: _0 ?Temp:  98.8 ?F (37.1 ?C)  98.5 ?F (36.9 ?C)  ?TempSrc:  Oral  Oral  ?SpO2: 99% 100% 99% 100%  ?Weight:  93 kg    ?Height:      ? ? ?Intake/Output Summary (Last 24 hours) at 07/10/2021 1225 ?Last data filed at 07/10/2021 1154 ?Gross per 24 hour  ?Intake 0 ml  ?Output 1300 ml  ?Net -1300 ml  ? ?Filed Weights  ? 07/08/21 0425 07/09/21 0534 07/10/21 0400  ?Weight: 94 kg 94 kg 93 kg  ? ?51yo M resting quietly in no acute distress ?Thin secretions from trach. Site appears without erythema/purulence ?Coarse, nonlabored ?RRR, no MRG ?Soft, NT, ND, PEG site c/d/i  ?Heels floated.  ? ? ?Wounds: Please see nursing documentation ? ?Pressure Injury 05/25/21 Heel Posterior;Right Deep Tissue Pressure Injury - Purple or maroon localized area of discolored intact skin or blood-filled blister due to damage of underlying soft tissue from pressure and/or shear. (Active)  ?05/25/21 0800  ?Location: Heel  ?Location Orientation: Posterior;Right  ?Staging: Deep Tissue Pressure Injury - Purple or maroon localized area of discolored intact skin or blood-filled blister due to damage of underlying soft tissue from pressure  and/or shear.  ?Wound Description (Comments):   ?Present on Admission: No  ?Dressing Type Foam - Lift dressing to assess site every shift 07/10/21 0006  ?   ?Pressure Injury 05/25/21 Foot Right Stage 2 -  Partial thickness loss of dermis presenting as a shallow open injury with a red, pink wound bed without slough. (Active)  ?05/25/21 0800  ?Location: Foot  ?Location Orientation: Right (metatarsal heads)  ?Staging: Stage 2 -  Partial thickness loss of dermis presenting as a shallow open injury with a red, pink wound bed without slough. (metatarsal heads)  ?Wound Description (Comments):   ?Present on Admission: No  ?  ? ? ?  ? ? ?  ? ?Data Reviewed: I have personally reviewed following labs and imaging studies ? ?CBC: ?Recent Labs  ?Lab 07/07/21 ?1062 07/10/21 ?0414  ?WBC 7.1 5.4  ?HGB 12.3* 12.0*  ?HCT 38.1* 37.2*  ?MCV 95.7 96.1  ?PLT 184 220  ? ?Basic Metabolic Panel: ?Recent Labs  ?Lab 07/07/21 ?6948 07/10/21 ?0414  ?NA 134* 134*  ?K 4.2 4.4  ?CL 97* 100  ?CO2 28 29  ?GLUCOSE 111* 131*  ?BUN 39* 31*  ?CREATININE 0.65 0.68  ?CALCIUM 9.2 9.2  ? ?GFR: ?Estimated Creatinine Clearance: 126.6 mL/min (by C-G formula based on SCr of 0.68 mg/dL). ? ? ? ?Radiology Studies: ?No results found. ? ? ?Scheduled Meds: ? apixaban  5 mg Per Tube BID  ? atropine  2  drop Sublingual QID  ? carvedilol  25 mg Per Tube BID  ? chlorhexidine  15 mL Mouth/Throat BID  ? clonazepam  0.5 mg Per Tube TID  ? doxazosin  2 mg Per Tube Daily  ? feeding supplement (OSMOLITE 1.5 CAL)  355 mL Per Tube QID  ? feeding supplement (PROSource TF)  45 mL Per Tube BID  ? fiber  1 packet Per Tube BID  ? free water  300 mL Per Tube Q4H  ? Gerhardt's butt cream   Topical Daily  ? glycopyrrolate  1 mg Oral TID  ? hydrALAZINE  25 mg Oral Q8H  ? isosorbide dinitrate  30 mg Per Tube TID  ? levETIRAcetam  750 mg Per Tube BID  ? mouth rinse  15 mL Mouth Rinse q12n4p  ? nutrition supplement (JUVEN)  1 packet Per Tube BID BM  ? pantoprazole sodium  40 mg Per Tube QHS   ? scopolamine  1 patch Transdermal Q72H  ? spironolactone  25 mg Per Tube Daily  ? valproic acid  750 mg Per Tube TID  ? ?Continuous Infusions: ? ? ? ? LOS: 102 days  ? ? ?Patrecia Pour, MD ?[facility]

## 2021-07-10 NOTE — TOC Progression Note (Addendum)
Transition of Care (TOC) - Progression Note  ? ? ?Patient Details  ?Name: Nakeem Murnane ?MRN: [id number] ?Date of Birth: [birth year] ? ?Transition of Care (TOC) CM/SW Contact  ?Janae Bridgeman, RN ?Phone Number: ?07/10/2021, 11:36 AM ? ?Clinical Narrative:    ?CM called and left a message with Jeanmarie Plant, Director for [employer] to follow up regarding staff availability for home health services - private duty.  Tod Persia, Director with [facility] is out of the office today but will return my call tomorrow for an update. I sent a secure email to follow up as well. ? ?CM and MSW with DTP Team will continue to follow the patient for discharge planning to home - discharge date unknown at this time. ? ? ?Expected Discharge Plan: Home w Home Health Services ?Barriers to Discharge: Other (must enter comment) (Patient's wife - requested private duty services through alternative provider for 12 hours shifts for staffing at the home.) ? ?Expected Discharge Plan and Services ?Expected Discharge Plan: Home w Home Health Services ?In-house Referral: Clinical Social Work ?Discharge Planning Services: CM Consult, Follow-up appt scheduled ?Post Acute Care Choice: Home Health ?Living arrangements for the past 2 months: Single Family Home ?                ?DME Arranged: Trach supplies, Tube feeding, Suction, Hospital bed, Oxygen ?DME Agency: [employer] ?  ?  ?  ?HH Arranged: Charity fundraiser Designer, fashion/clothing through [employer]) ?HH Agency: [employer] ?Date HH Agency Contacted: 07/04/21 ?Time HH Agency Contacted: 1000 ?Representative spoke with at [facility] Agency: Jeanmarie Plant, Director with [employer] [phone] ? ? ?Social Determinants of Health (SDOH) Interventions ?  ? ?Readmission Risk Interventions ? ?  04/14/2021  ? 11:14 AM  ?Readmission Risk Prevention Plan  ?Transportation Screening Complete  ?HRI or Home Care Consult Complete  ?Social Work Consult for Recovery Care Planning/Counseling  Complete  ?Palliative Care Screening Complete  ?Medication Review Oceanographer) Complete  ? ? ?

## 2021-07-10 NOTE — Plan of Care (Signed)
?  Problem: Elimination: ?Goal: Will not experience complications related to urinary retention ?Outcome: Progressing ?  ?

## 2021-07-10 NOTE — Progress Notes (Addendum)
?NAME:  Billy Cherry, MRN:  [id number], DOB:  1970-06-26, LOS: 102 ?ADMISSION DATE:  03/30/2021, CONSULTATION DATE:  1/19 ?REFERRING MD:  Parash[place], CHIEF COMPLAINT:  Found down, cardiac arrest  ? ?Brief Pt Description / Synopsis:  ?51 year old male with out-of-hospital V. fib cardiac arrest in setting of respiratory arrest due to suspected angioedema from ACE inhibitor leading to asphyxiation.  Concern on his 12 lead EKG for anterolateral ST elevation so he was taken to the cath lab where his study showed no significant coronary artery disease and an LVEF < 20%  Now with anoxic brain injury. ? ?Pertinent  Medical History  ?Hypertension ? ?Significant Hospital Events: ?Including procedures, antibiotic start and stop dates in addition to other pertinent events   ?1/19 admission, left heart cath without significant CAD.  Concern for anoxic brain injury ?1/20: MRI pending, Neuro following ?1/21: MRI shows areas of hypoxic-ischemic injury bilaterally. Fevered; cultures obtained. ?1/23 severe brain damage ?1/24 evidence of severe brain damage, remains critically ill ?1/25: Plan for PEG today. Neuro exam unchanged.  Does exhibit new eyelid tremor like movements and lip/jaw tremor like vs clonus movement.  Bolused Keppra and increased maintenance dose.  Repeat EEG. CTH: concerning for progression of anoxic injury.  ?1/26: Repeat EEG unchanged and continues to show myoclonic seizures despite addition of Valproic acid yesterday. transfer to [facility] for continuous EEG ?1/28: Continues to have myoclonic sz on EEG ?1/29 No acute issues overnight ?2/3 no meaningful neuro recovery. Diuresed ?2/5 unchanged ?2/6 New fever to 102, WBC 16K - treated with vancomycin for 7d based on cultures.  ?2/11: percutaneous tracheostomy ?2/13 >2/20 on trach collar. No vent.  ?2/20 Trach change to 6 cuffless ?2/27 status unchanged ?3/13 unchanged ?3/20 due for trach exchange given continue secretions (although improved) will stick to 6 cuffless  for exchange  ?06/05/2021 Continued thick  secretions, scant blood tinged 4/3/am ?06/06/2021 CT of the chest with new PE and haziness consistent with pneumonia ? ?Interim History / Subjective:  ?No distress ?Remains comatose ?Low grade fever 99.2, with some tan secretions, normal WBC ? ?Objective   ?Blood pressure (!) 134/102, pulse 99, temperature 98.8 ?F (37.1 ?C), temperature source Oral, resp. rate 20, height 5\' 10"  (1.778 m), weight 93 kg, SpO2 100 %. ?   ?FiO2 (%):  [28 %] 28 %  ? ?Intake/Output Summary (Last 24 hours) at 07/10/2021 1016 ?Last data filed at 07/09/2021 2300 ?Gross per 24 hour  ?Intake 0 ml  ?Output 900 ml  ?Net -900 ml  ? ? ?Filed Weights  ? 07/08/21 0425 07/09/21 0534 07/10/21 0400  ?Weight: 94 kg 94 kg 93 kg  ? ?Examination:  ?General: Comatose male , supine in bed, eyes closed  ?Skin: Warm and dry, brisk capillary refill, wounds with dressings as documented  ?HEENT: NCAT, Pupils are pinpoint, MM pink and moist , Trach secure and intact with thick tan secretions  ?Neck:supple, no JVD, no bruits , trach as above ?Heart:S1S2 RRR without murmur, gallup, rub or click ?Lungs:Bilateral chest excursion ,  + rhonchi, no wheezing  ?Abd: soft, non tender, + BS, did not palpate liver spleen or masses ?Ext:no trace BLE edema , 2+ pedal pulses, 2+ radial pulses ?Neuro: comatose, pinpoint pupils, eyes open at times, but no focus or tracking, does not follow any commands ? ?BMP/CBC look okay>> WBC is normal , HGB 12 ?No new chest imaging ? ?Ancillary tests personally reviewed:    ? ?Assessment & Plan:  ?OOH Vfib arrest ?Post arrest anoxic brain injury,  severe ?Post arrest myoclonus improved with klonopin ?Post arrest respiratory failure with prolonged mechanical ventilation s/p tracheostomy ?Post arrest cardiomyopathy ?Post arrest AKI, resolved ?HTN- stable on current regimen ?PE- on eliquis ? ?Plan:  ?Continue current treatment plan ?Continue trach collar, and trach care ?Continue eliquis indefinitely as high risk  of recurrence ?Trend CXR prn , Follow CBC and temp curve ?Grim prognosis ? ?Will see weekly, next 5/15 ? ?APP Time >> 35 minutes ? ?Magdalen Spatz, MSN, AGACNP-BC ?[facility] ?See Amion for personal pager ?PCCM on call pager [phone]  ?07/10/2021 ?10:29 AM  ? ?

## 2021-07-11 NOTE — TOC Progression Note (Signed)
Transition of Care (TOC) - Progression Note  ? ? ?Patient Details  ?Name: Billy Cherry ?MRN: [id number] ?Date of Birth: 04-19-1970 ? ?Transition of Care (TOC) CM/SW Contact  ?Janae Bridgeman, RN ?Phone Number: ?07/11/2021, 10:21 AM ? ?Clinical Narrative:    ?CM called and left a message with Jeanmarie Plant, Director with [employer] to follow up regarding private duty staff for the patient in the home. ? ?CM and MSW with DTP Team continue to follow the patient for discharge planning needs for home. ? ? ?Expected Discharge Plan: Home w Home Health Services ?Barriers to Discharge: Other (must enter comment) (Patient's wife - requested private duty services through alternative provider for 12 hours shifts for staffing at the home.) ? ?Expected Discharge Plan and Services ?Expected Discharge Plan: Home w Home Health Services ?In-house Referral: Clinical Social Work ?Discharge Planning Services: CM Consult, Follow-up appt scheduled ?Post Acute Care Choice: Home Health ?Living arrangements for the past 2 months: Single Family Home ?                ?DME Arranged: Trach supplies, Tube feeding, Suction, Hospital bed, Oxygen ?DME Agency: [employer] ?  ?  ?  ?HH Arranged: Charity fundraiser Designer, fashion/clothing through [employer]) ?HH Agency: [employer] ?Date HH Agency Contacted: 07/04/21 ?Time HH Agency Contacted: 1000 ?Representative spoke with at [facility] Agency: Jeanmarie Plant, Director with [employer] [phone] ? ? ?Social Determinants of Health (SDOH) Interventions ?  ? ?Readmission Risk Interventions ? ?  04/14/2021  ? 11:14 AM  ?Readmission Risk Prevention Plan  ?Transportation Screening Complete  ?HRI or Home Care Consult Complete  ?Social Work Consult for Recovery Care Planning/Counseling Complete  ?Palliative Care Screening Complete  ?Medication Review Oceanographer) Complete  ? ? ?

## 2021-07-11 NOTE — Progress Notes (Signed)
?   07/11/21 1133  ?Assess: MEWS Score  ?Temp 98.4 ?F (36.9 ?C)  ?BP (!) 120/97  ?Pulse Rate 91  ?Resp (!) 22  ?SpO2 100 %  ?O2 Device Tracheostomy Collar  ?O2 Flow Rate (L/min) 5 L/min  ?FiO2 (%) 28 %  ?Assess: MEWS Score  ?MEWS Temp 0  ?MEWS Systolic 0  ?MEWS Pulse 0  ?MEWS RR 1  ?MEWS LOC 2  ?MEWS Score 3  ?MEWS Score Color Yellow  ?Assess: if the MEWS score is Yellow or Red  ?Were vital signs taken at a resting state? Yes  ?Focused Assessment No change from prior assessment  ?Early Detection of Sepsis Score *See Row Information* Low  ?MEWS guidelines implemented *See Row Information* Yes  ?Treat  ?MEWS Interventions Administered scheduled meds/treatments  ?Take Vital Signs  ?Increase Vital Sign Frequency  Yellow: Q 2hr X 2 then Q 4hr X 2, if remains yellow, continue Q 4hrs  ?Escalate  ?MEWS: Escalate Yellow: discuss with charge nurse/RN and consider discussing with provider and RRT  ?Notify: Charge Nurse/RN  ?Name of Charge Nurse/RN Notified Luanne Bras, RN  ?Date Charge Nurse/RN Notified 07/11/21  ?Time Charge Nurse/RN Notified 1735  ?Document  ?Patient Outcome Stabilized after interventions  ?Progress note created (see row info) Yes  ? ? ?

## 2021-07-11 NOTE — Progress Notes (Signed)
Nutrition Follow-up ? ?DOCUMENTATION CODES:  ? ?Not applicable ? ?INTERVENTION:  ?Continue tube feeding via PEG:  ?1.5 cartons of Osmolite 1.5 - QID (355 mL each bolus) ?45 mL ProSource TF - BID ?300 mL free water q4h  ?Provides 2210 kcal, 111 gm PRO, and 1086 mL free water (2879 mL total free water) daily.  ?  ?Continue Juven BID per tube, each packet provides 80 calories, 8 grams of carbohydrate, 2.5  grams of protein to promote wound healing ? ?NUTRITION DIAGNOSIS:  ? ?Inadequate oral intake related to inability to eat as evidenced by NPO status; ongoing ? ?GOAL:  ? ?Patient will meet greater than or equal to 90% of their needs; met with TF ? ?MONITOR:  ? ?Labs, Weight trends, TF tolerance, Skin, I & O's ? ?REASON FOR ASSESSMENT:  ? ?Ventilator, Consult ?Enteral/tube feeding initiation and management ? ?ASSESSMENT:  ? ?51 year old male who originally presented to [facility] on 1/19 after out-of-hospital cardiac arrest due to suspected angioedema from ACE inhibitor leading to asphyxiation. PMH of HTN, HLD. ? ?2/11 - tracheostomy placed ?2/22 - PEG placed  ?  ?Pt has been tolerating his tube feeds well with no difficulties. Recommend continuation of current regimen. Labs and medications reviewed.  ? ?Diet Order:  NPO ?Diet Order   ? ? None  ? ?  ? ? ?EDUCATION NEEDS:  ? ?Not appropriate for education at this time ? ?Skin:  Skin Assessment: Reviewed RN Assessment ?Skin Integrity Issues:: Stage II, DTI ?DTI: heel ?Stage II: R foot ?Stage III: N/A ?Other: MASD Buttocks ? ?Last BM:  5/7 ? ?Height:  ? ?Ht Readings from Last 1 Encounters:  ?04/18/21 $RemoveBe'5\' 10"'[id number]$  (1.778 m)  ? ? ?Weight:  ? ?Wt Readings from Last 1 Encounters:  ?07/11/21 96 kg  ? ? ?Ideal Body Weight:  75.5 kg ? ?BMI:  Body mass index is 30.37 kg/m?. ? ?Estimated Nutritional Needs:  ? ?Kcal:  2200-2400 ? ?Protein:  110-125 grams ? ?Fluid:  >/= 2.2 L ? ?Corrin Parker, MS, RD, LDN ?RD pager number/after hours weekend pager number on Amion. ? ?

## 2021-07-11 NOTE — Consult Note (Addendum)
WOC Nurse wound follow up ?Wound type: Previously noted right plantar foot wound is pink dry and healed. ?Right heel with Unstageable pressure injury; 5X4cm, 100% dry eschar.  It is best practice to leave dry stable eschar in place.  ?Continue present plan of care; Prevalon boots are in place to reduce pressure.  Foam dressings to protect from further injury. ?WOC team will reassess next week to determine if a change in the plan of care is indicated at that time. ?Cammie Mcgee MSN, RN, CWOCN, CWCN-AP, CNS ?[phone]  ?

## 2021-07-11 NOTE — Progress Notes (Signed)
?PROGRESS NOTE ? ? ? ?Billy Cherry  ZDG:[id number] DOB: 04/27/1970 DOA: 03/30/2021 ?PCP: Patient, No Pcp Per (Inactive)  ? ? ?Brief Narrative:  ?51 years old male with past medical history of hypertension was brought into the hospital after being found unresponsive by his wife.  Out of hospital V-fib arrest, received CPR, 1 shock with defibrillator, 2 doses of epinephrine with ROSC, EKG noted anterolateral STEMI ?1/19 admission, left heart cath without significant CAD.  Concern for anoxic brain injury ?1/21: MRI shows areas of hypoxic-ischemic injury bilaterally.  ?1/23 severe brain damage ?1/25: PEG . Neuro exam unchanged.  Does exhibit new eyelid tremor like movements and lip/jaw tremor like vs clonus movement.  Bolused Keppra and increased maintenance dose.  Repeat EEG. CTH: concerning for progression of anoxic injury.  ?1/26: Repeat EEG unchanged and continues to show myoclonic seizures despite addition of Valproic acid. [name] for continuous EEG ?1/28: Continues to have myoclonic sz on EEG ?2/6 New fever to 102, WBC 16K - treated with vancomycin for 7d based on cultures.  ?2/11: percutaneous tracheostomy ?2/20 Trach change to 6 cuffless ?3/13: eliquis started ?06/06/2021 CT of the chest with new PE and haziness consistent with pneumonia ? ? ?Subjective : ?Pt remains nonverbal, no events overnight.  ? ?Assessment and Plan: ? ?Cardiac arrest ([name])/ Postarrest anoxic encephalopathy / Postarrest respiratory failure s/p tracheostomy, S/P PEG tube placement ?Postarrest myoclonus: ?2D echocardiogram showed reduced LV function at <20%.  Now status post trach and PEG.   ?Remains obtunded with anoxic encephalopathy-on Keppra, Klonopin for breakthrough myoclonus.   ?At this time, plan is home with home health ?-Wife has reportedly completed education for trach and PEG tube ?-Enteral tube feeding trach kit and supplies to be arranged. PCCM managing tracheostomy. ?-Seen by palliative care multiple  times this admission, patient's wife continues to want full code and full scope of treatment. Will continue goals of care discussions.  ?-Plan for discharge home with extensive home health services, nursing care being arranged by TOC. ?Patient remains stable.  Afebrile.  Will order labs for tomorrow. ? 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  Continue supportive care. ? ?Acute respiratory failure ([place]) ?Status post trach collar.  Continue supportive care ?-On 5 L, satting 95%. D/w Dr. [place] Bells today. There is no plan for decannulation. ? ?RLL pneumonia ?Previously had completed vancomycin for MSSA and [name] in early march.  Received more than 7-day course of IV Fortaz. ?On 4/3, CT chest was ordered that indicated an area of alveolar infiltration that may represent pneumonia.  Tracheal aspirate was normal flora.   ?-s/p Unasyn x 7 days for suspicion of aspiration. ?Remains stable.  Continue to monitor. ? ?PE/DVT (deep venous thrombosis) ([place]) ?-CTA chest 4/4 noted bilateral PE ?-Duplex ultrasound of lower extremity on 05/15/2021 showed age indeterminate deep vein thrombosis involving the left femoral vein, left proximal profunda vein, left posterior tibial veins, left popliteal vein, and left peroneal veins.  ?-Was on Eliquis initially, briefly treated with Lovenox and then back on Eliquis now ?-Continue Eliquis ?- Repeated duplex (due to fever) and clot has not propagated.   ? ?Dysphagia ?Continue PEG tube feeding.  ?-Aspiration precautions ? ?Pressure injury of skin ?Posterior right heel, right foot. Not present on admission. ? ?HTN (hypertension) ?-Continue Coreg, Cardura, isosorbide dinitrate, hydralazine. BP up, will increase hydralazine dose starting this PM. ? ?HFrEF (heart failure with reduced ejection fraction) ([place]) ?2D echocardiogram-LVEF < 20%,  ?-Continue coreg, imdur, spironolactone ?-Has required furosemide intermittently  for volume overload.  Seems to be stable  currently. Will not redose lasix at this time. ? ?Myoclonus ?Seen by neurology.  Continue current AED with Klonopin, Keppra.  ? ?Hypernatremia ?Resolved as of 06/09/2021 ? ?Hyperkalemia ?Resolved ? ?Overall poor prognosis ?Goals of care discussions ?-Remains unresponsive with trach, PEG, pressure wounds now ?-Seen by palliative care multiple times this admission, patient's wife continues to want full code and full scope of treatment ? ? ?DVT prophylaxis: apixaban (ELIQUIS) tablet 5 mg  ?  Code Status: Full Code ?Family Communication: No family at bedside. ?Disposition Plan: Home with home health services once arrangements completed by [facility] ?  ? ?Consultants:  ?PCCM ?Neurology ?Palliative care ?ID ?IR ?GI ?cards ? ?Objective: ?Vitals:  ? 07/11/21 0400 07/11/21 0500 07/11/21 0745 07/11/21 0825  ?BP: (!) 118/99  (!) 120/94   ?Pulse: 97  92 89  ?Resp: $Remov'17  15 20  'bEyVXG$ ?Temp: 98.7 ?F (37.1 ?C)  98.5 ?F (36.9 ?C)   ?TempSrc: Axillary  Oral   ?SpO2: 98%  100% 99%  ?Weight:  96 kg    ?Height:      ? ? ?Intake/Output Summary (Last 24 hours) at 07/11/2021 1000 ?Last data filed at 07/11/2021 0459 ?Gross per 24 hour  ?Intake 2715 ml  ?Output 1800 ml  ?Net 915 ml  ? ?Filed Weights  ? 07/09/21 0534 07/10/21 0400 07/11/21 0500  ?Weight: 94 kg 93 kg 96 kg  ? ?50yo M diaphoretic but afebrile resting quietly ?No exudate around trach site, thin white secretions noted, stable. ?Coarse with normal rate and effort thru trach collar.  ?Regular, rate in 90's, no MRG or LE edema. ?Soft, NT, ND, PEG site c//di without discharge ?Boots in place. Right heel with dry eschar. No deformities. ? ? ?Wounds: Please see nursing documentation ? ?Pressure Injury 05/25/21 Heel Posterior;Right Unstageable - Full thickness tissue loss in which the base of the injury is covered by slough (yellow, tan, gray, green or brown) and/or eschar (tan, brown or black) in the wound bed. (Active)  ?05/25/21 0800  ?Location: Heel  ?Location Orientation: Posterior;Right  ?Staging:  Unstageable - Full thickness tissue loss in which the base of the injury is covered by slough (yellow, tan, gray, green or brown) and/or eschar (tan, brown or black) in the wound bed.  ?Wound Description (Comments):   ?Present on Admission: No  ?Dressing Type Foam - Lift dressing to assess site every shift 07/10/21 2000  ?  ? ? ?  ? ? ?  ? ?Data Reviewed: I have personally reviewed following labs and imaging studies ? ?CBC: ?Recent Labs  ?Lab 07/07/21 ?7858 07/10/21 ?0414  ?WBC 7.1 5.4  ?HGB 12.3* 12.0*  ?HCT 38.1* 37.2*  ?MCV 95.7 96.1  ?PLT 184 220  ? ?Basic Metabolic Panel: ?Recent Labs  ?Lab 07/07/21 ?8502 07/10/21 ?0414  ?NA 134* 134*  ?K 4.2 4.4  ?CL 97* 100  ?CO2 28 29  ?GLUCOSE 111* 131*  ?BUN 39* 31*  ?CREATININE 0.65 0.68  ?CALCIUM 9.2 9.2  ? ?GFR: ?Estimated Creatinine Clearance: 128.4 mL/min (by C-G formula based on SCr of 0.68 mg/dL). ? ? ? ?Radiology Studies: ?No results found. ? ? ?Scheduled Meds: ? apixaban  5 mg Per Tube BID  ? atropine  2 drop Sublingual QID  ? carvedilol  25 mg Per Tube BID  ? chlorhexidine  15 mL Mouth/Throat BID  ? clonazepam  0.5 mg Per Tube TID  ? doxazosin  2 mg Per Tube Daily  ? feeding supplement (  OSMOLITE 1.5 CAL)  355 mL Per Tube QID  ? feeding supplement (PROSource TF)  45 mL Per Tube BID  ? fiber  1 packet Per Tube BID  ? free water  300 mL Per Tube Q4H  ? Gerhardt's butt cream   Topical Daily  ? glycopyrrolate  1 mg Oral TID  ? hydrALAZINE  50 mg Oral Q8H  ? isosorbide dinitrate  30 mg Per Tube TID  ? levETIRAcetam  750 mg Per Tube BID  ? mouth rinse  15 mL Mouth Rinse q12n4p  ? nutrition supplement (JUVEN)  1 packet Per Tube BID BM  ? pantoprazole sodium  40 mg Per Tube QHS  ? scopolamine  1 patch Transdermal Q72H  ? spironolactone  25 mg Per Tube Daily  ? valproic acid  750 mg Per Tube TID  ? ?Continuous Infusions: ? ? ? ? LOS: 103 days  ? ? ?Patrecia Pour, MD ?[facility] ? ?07/11/2021, 10:00 AM   ?

## 2021-07-11 NOTE — Plan of Care (Signed)
?  Problem: Clinical Measurements: ?Goal: Diagnostic test results will improve ?Outcome: Progressing ?  ?Problem: Clinical Measurements: ?Goal: Respiratory complications will improve ?Outcome: Progressing ?  ?Problem: Pain Managment: ?Goal: General experience of comfort will improve ?Outcome: Progressing ?  ?Problem: Skin Integrity: ?Goal: Risk for impaired skin integrity will decrease ?Outcome: Progressing ?  ?Problem: Elimination: ?Goal: Will not experience complications related to bowel motility ?Outcome: Progressing ?  ?Problem: Elimination: ?Goal: Will not experience complications related to urinary retention ?Outcome: Progressing ?  ?

## 2021-07-12 MED ORDER — HYDRALAZINE HCL 50 MG PO TABS
50.0000 mg | ORAL_TABLET | Freq: Three times a day (TID) | ORAL | Status: DC
  Administered 2021-07-12 – 2021-08-08 (×77): 50 mg
  Filled 2021-07-12 (×82): qty 1

## 2021-07-12 MED ORDER — GLYCOPYRROLATE 1 MG PO TABS
1.0000 mg | ORAL_TABLET | Freq: Three times a day (TID) | ORAL | Status: DC
  Administered 2021-07-12 – 2021-10-04 (×251): 1 mg
  Filled 2021-07-12 (×254): qty 1

## 2021-07-12 NOTE — Progress Notes (Signed)
?PROGRESS NOTE ? ? ? ?Billy Cherry  FYT:[id number] DOB: 1970/11/22 DOA: 03/30/2021 ?PCP: Patient, No Pcp Per (Inactive)  ? ? ?Brief Narrative:  ?51 years old male with past medical history of hypertension was brought into the hospital after being found unresponsive by his wife.  Out of hospital V-fib arrest, received CPR, 1 shock with defibrillator, 2 doses of epinephrine with ROSC, EKG noted anterolateral STEMI ?1/19 admission, left heart cath without significant CAD.  Concern for anoxic brain injury ?1/21: MRI shows areas of hypoxic-ischemic injury bilaterally.  ?1/23 severe brain damage ?1/25: PEG . Neuro exam unchanged.  Does exhibit new eyelid tremor like movements and lip/jaw tremor like vs clonus movement.  Bolused Keppra and increased maintenance dose.  Repeat EEG. CTH: concerning for progression of anoxic injury.  ?1/26: Repeat EEG unchanged and continues to show myoclonic seizures despite addition of Valproic acid. [name] for continuous EEG ?1/28: Continues to have myoclonic sz on EEG ?2/6 New fever to 102, WBC 16K - treated with vancomycin for 7d based on cultures.  ?2/11: percutaneous tracheostomy ?2/20 Trach change to 6 cuffless ?3/13: eliquis started ?06/06/2021 CT of the chest with new PE and haziness consistent with pneumonia ? ? ?Subjective : ?Pt remains nonverbal, no events overnight.  ? ?Assessment and Plan: ? ?Cardiac arrest ([name])/ Postarrest anoxic encephalopathy / Postarrest respiratory failure s/p tracheostomy, S/P PEG tube placement ?Postarrest myoclonus: ?2D echocardiogram showed reduced LV function at <20%.  Now status post trach and PEG.   ?Remains obtunded with anoxic encephalopathy-on Keppra, Klonopin for breakthrough myoclonus.   ?At this time, plan is home with home health ?-Wife has reportedly completed education for trach and PEG tube ?-Enteral tube feeding trach kit and supplies to be arranged. PCCM managing tracheostomy. ?-Seen by palliative care multiple  times this admission, patient's wife continues to want full code and full scope of treatment. Will continue goals of care discussions.  ?-Plan for discharge home with extensive home health services, nursing care being arranged by TOC. ?Patient remains stable.  Afebrile.  Will order labs for tomorrow. ? 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  Continue supportive care. ? ?Acute respiratory failure ([place]) ?Status post trach collar.  Continue supportive care ?-On 5 L, satting 95%. D/w PCCM. There is no plan for decannulation. ? ?RLL pneumonia ?Previously had completed vancomycin for MSSA and [name] in early march.  Received more than 7-day course of IV Fortaz. ?On 4/3, CT chest was ordered that indicated an area of alveolar infiltration that may represent pneumonia.  Tracheal aspirate was normal flora.   ?-s/p Unasyn x 7 days for suspicion of aspiration. ?Remains stable.  Continue to monitor. Note tachypnea which is stable.  ? ?PE/DVT (deep venous thrombosis) ([name]) ?-CTA chest 4/4 noted bilateral PE ?-Duplex ultrasound of lower extremity on 05/15/2021 showed age indeterminate deep vein thrombosis involving the left femoral vein, left proximal profunda vein, left posterior tibial veins, left popliteal vein, and left peroneal veins.  ?-Was on Eliquis initially, briefly treated with Lovenox and then back on Eliquis now ?-Continue Eliquis ?- Repeated duplex (due to fever) and clot has not propagated.   ? ?Dysphagia ?Continue PEG tube feeding.  ?-Aspiration precautions ? ?Pressure injury of skin ?Posterior right heel, right foot. Not present on admission. ? ?HTN (hypertension) ?-Continue Coreg, Cardura, isosorbide dinitrate, hydralazine. BP up, will increase hydralazine dose starting this PM. ? ?HFrEF (heart failure with reduced ejection fraction) ([place]) ?2D echocardiogram-LVEF < 20%,  ?-Continue coreg, imdur, spironolactone ?-  Has required furosemide intermittently for volume overload.   Seems to be stable currently. Will not redose lasix at this time. ? ?Myoclonus ?Seen by neurology.  Continue current AED with Klonopin, Keppra.  ? ?Hypernatremia ?Resolved as of 06/09/2021 ? ?Hyperkalemia ?Resolved ? ?Overall poor prognosis ?Goals of care discussions ?-Remains unresponsive with trach, PEG, pressure wounds now ?-Seen by palliative care multiple times this admission, patient's wife continues to want full code and full scope of treatment ? ? ?DVT prophylaxis: apixaban (ELIQUIS) tablet 5 mg  ?  Code Status: Full Code ?Family Communication: No family at bedside. ?Disposition Plan: Home with home health services once arrangements completed by [facility] ?  ? ?Consultants:  ?PCCM ?Neurology ?Palliative care ?ID ?IR ?GI ?cards ? ?Objective: ?Vitals:  ? 07/12/21 0742 07/12/21 0822 07/12/21 1150 07/12/21 1208  ?BP: (!) 148/112  (!) 156/113 (!) 156/103  ?Pulse: 93 93 99   ?Resp: (!) 22 (!) 22 19   ?Temp: 99.2 ?F (37.3 ?C)  99 ?F (37.2 ?C)   ?TempSrc: Axillary  Axillary   ?SpO2: 96% 97% 99%   ?Weight:      ?Height:      ? ? ?Intake/Output Summary (Last 24 hours) at 07/12/2021 1225 ?Last data filed at 07/12/2021 0400 ?Gross per 24 hour  ?Intake 1310 ml  ?Output 600 ml  ?Net 710 ml  ? ?Filed Weights  ? 07/11/21 0500 07/12/21 0000 07/12/21 0433  ?Weight: 96 kg 94 kg 94 kg  ? ?No distress, not verbal and doesn't track me in the room. ?Coarse, mostly upper airway sounds, nonlabored thru trach collar.  ?Regular, rate in 100's. No MRG ?Soft, NT, ND. Normal appearance of PEG in LUQ. ?Heels floated in boots, right heel eschar stable. No pitting edema.  ? ? ?Wounds: Please see nursing documentation ? ?Pressure Injury 05/25/21 Heel Posterior;Right Unstageable - Full thickness tissue loss in which the base of the injury is covered by slough (yellow, tan, gray, green or brown) and/or eschar (tan, brown or black) in the wound bed. (Active)  ?05/25/21 0800  ?Location: Heel  ?Location Orientation: Posterior;Right  ?Staging: Unstageable -  Full thickness tissue loss in which the base of the injury is covered by slough (yellow, tan, gray, green or brown) and/or eschar (tan, brown or black) in the wound bed.  ?Wound Description (Comments):   ?Present on Admission: No  ?Dressing Type Foam - Lift dressing to assess site every shift 07/11/21 2000  ?  ? ? ?  ? ? ?  ? ?Data Reviewed: I have personally reviewed following labs and imaging studies ? ?CBC: ?Recent Labs  ?Lab 07/07/21 ?1607 07/10/21 ?0414  ?WBC 7.1 5.4  ?HGB 12.3* 12.0*  ?HCT 38.1* 37.2*  ?MCV 95.7 96.1  ?PLT 184 220  ? ?Basic Metabolic Panel: ?Recent Labs  ?Lab 07/07/21 ?3710 07/10/21 ?0414  ?NA 134* 134*  ?K 4.2 4.4  ?CL 97* 100  ?CO2 28 29  ?GLUCOSE 111* 131*  ?BUN 39* 31*  ?CREATININE 0.65 0.68  ?CALCIUM 9.2 9.2  ? ?GFR: ?Estimated Creatinine Clearance: 127.2 mL/min (by C-G formula based on SCr of 0.68 mg/dL). ? ? ? ?Radiology Studies: ?No results found. ? ? ?Scheduled Meds: ? apixaban  5 mg Per Tube BID  ? atropine  2 drop Sublingual QID  ? carvedilol  25 mg Per Tube BID  ? chlorhexidine  15 mL Mouth/Throat BID  ? clonazepam  0.5 mg Per Tube TID  ? doxazosin  2 mg Per Tube Daily  ? feeding supplement (  OSMOLITE 1.5 CAL)  355 mL Per Tube QID  ? feeding supplement (PROSource TF)  45 mL Per Tube BID  ? fiber  1 packet Per Tube BID  ? free water  300 mL Per Tube Q4H  ? Gerhardt's butt cream   Topical Daily  ? glycopyrrolate  1 mg Per Tube TID  ? hydrALAZINE  50 mg Per Tube Q8H  ? isosorbide dinitrate  30 mg Per Tube TID  ? levETIRAcetam  750 mg Per Tube BID  ? mouth rinse  15 mL Mouth Rinse q12n4p  ? nutrition supplement (JUVEN)  1 packet Per Tube BID BM  ? pantoprazole sodium  40 mg Per Tube QHS  ? scopolamine  1 patch Transdermal Q72H  ? spironolactone  25 mg Per Tube Daily  ? valproic acid  750 mg Per Tube TID  ? ?Continuous Infusions: ? ? ? ? LOS: 104 days  ? ? ?Patrecia Pour, MD ?[facility] ? ?07/12/2021, 12:25 PM   ?

## 2021-07-12 NOTE — TOC Progression Note (Signed)
Transition of Care (TOC) - Progression Note  ? ? ?Patient Details  ?Name: Billy Cherry ?MRN: [id number] ?Date of Birth: February 11, 1971 ? ?Transition of Care (TOC) CM/SW Contact  ?Janae Bridgeman, RN ?Phone Number: ?07/12/2021, 9:59 AM ? ?Clinical Narrative:    ?CM called and left a message with Jeanmarie Plant, director with [facility] in [place] and he states that he will follow up with me this morning after a meeting at the office. ? ?CM and MSW with DTP Team will continue to follow the patient for discharge planning needs. ? ? ?Expected Discharge Plan: Home w Home Health Services ?Barriers to Discharge: Other (must enter comment) (Patient's wife - requested private duty services through alternative provider for 12 hours shifts for staffing at the home.) ? ?Expected Discharge Plan and Services ?Expected Discharge Plan: Home w Home Health Services ?In-house Referral: Clinical Social Work ?Discharge Planning Services: CM Consult, Follow-up appt scheduled ?Post Acute Care Choice: Home Health ?Living arrangements for the past 2 months: Single Family Home ?                ?DME Arranged: Trach supplies, Tube feeding, Suction, Hospital bed, Oxygen ?DME Agency: [employer] ?  ?  ?  ?HH Arranged: Charity fundraiser Designer, fashion/clothing through [employer]) ?HH Agency: [employer] ?Date HH Agency Contacted: 07/04/21 ?Time HH Agency Contacted: 1000 ?Representative spoke with at [facility] Agency: Jeanmarie Plant, Director with [employer] [phone] ? ? ?Social Determinants of Health (SDOH) Interventions ?  ? ?Readmission Risk Interventions ? ?  04/14/2021  ? 11:14 AM  ?Readmission Risk Prevention Plan  ?Transportation Screening Complete  ?HRI or Home Care Consult Complete  ?Social Work Consult for Recovery Care Planning/Counseling Complete  ?Palliative Care Screening Complete  ?Medication Review Oceanographer) Complete  ? ? ?

## 2021-07-12 NOTE — Progress Notes (Signed)
Patient seen today by trach team for consult.  No education is needed at this time.  All necessary equipment is at beside.   Will continue to follow for progression.  

## 2021-07-13 LAB — VALPROIC ACID LEVEL: Valproic Acid Lvl: 71 ug/mL (ref 50.0–100.0)

## 2021-07-13 NOTE — TOC Progression Note (Signed)
Transition of Care (TOC) - Progression Note  ? ? ?Patient Details  ?Name: Billy Cherry ?MRN: [id number] ?Date of Birth: Feb 14, 1971 ? ?Transition of Care (TOC) CM/SW Contact  ?Janae Bridgeman, RN ?Phone Number: ?07/13/2021, 8:15 AM ? ?Clinical Narrative:    ?CM spoke with Jeanmarie Plant, Admissions director for [facility] and they are coordinating nursing coverage for the patient for private duty at the home to provide 12 hour shifts - coverage has not been finalized at this time.  CM and MSW with DTP Team will continue to follow the patient for discharge planning to home. ? ? ?Expected Discharge Plan: Home w Home Health Services ?Barriers to Discharge: Other (must enter comment) (Patient's wife - requested private duty services through alternative provider for 12 hours shifts for staffing at the home.) ? ?Expected Discharge Plan and Services ?Expected Discharge Plan: Home w Home Health Services ?In-house Referral: Clinical Social Work ?Discharge Planning Services: CM Consult, Follow-up appt scheduled ?Post Acute Care Choice: Home Health ?Living arrangements for the past 2 months: Single Family Home ?                ?DME Arranged: Trach supplies, Tube feeding, Suction, Hospital bed, Oxygen ?DME Agency: [employer] ?  ?  ?  ?HH Arranged: Charity fundraiser Designer, fashion/clothing through [employer]) ?HH Agency: [employer] ?Date HH Agency Contacted: 07/04/21 ?Time HH Agency Contacted: 1000 ?Representative spoke with at [facility] Agency: Jeanmarie Plant, Director with [employer] [phone] ? ? ?Social Determinants of Health (SDOH) Interventions ?  ? ?Readmission Risk Interventions ? ?  04/14/2021  ? 11:14 AM  ?Readmission Risk Prevention Plan  ?Transportation Screening Complete  ?HRI or Home Care Consult Complete  ?Social Work Consult for Recovery Care Planning/Counseling Complete  ?Palliative Care Screening Complete  ?Medication Review Oceanographer) Complete  ? ? ?

## 2021-07-13 NOTE — Progress Notes (Signed)
?PROGRESS NOTE ? ? ? ?Billy Cherry  VZD:[id number] DOB: 05-04-1970 DOA: 03/30/2021 ?PCP: Patient, No Pcp Per (Inactive)  ? ? ?Brief Narrative:  ?51 years old male with past medical history of hypertension was brought into the hospital after being found unresponsive by his wife.  Out of hospital V-fib arrest, received CPR, 1 shock with defibrillator, 2 doses of epinephrine with ROSC, EKG noted anterolateral STEMI ?1/19 admission, left heart cath without significant CAD.  Concern for anoxic brain injury ?1/21: MRI shows areas of hypoxic-ischemic injury bilaterally.  ?1/23 severe brain damage ?1/25: PEG . Neuro exam unchanged.  Does exhibit new eyelid tremor like movements and lip/jaw tremor like vs clonus movement.  Bolused Keppra and increased maintenance dose.  Repeat EEG. CTH: concerning for progression of anoxic injury.  ?1/26: Repeat EEG unchanged and continues to show myoclonic seizures despite addition of Valproic acid. [facility] to [name] for continuous EEG ?1/28: Continues to have myoclonic sz on EEG ?2/6 New fever to 102, WBC 16K - treated with vancomycin for 7d based on cultures.  ?2/11: percutaneous tracheostomy ?2/20 Trach change to 6 cuffless ?3/13: eliquis started ?06/06/2021 CT of the chest with new PE and haziness consistent with pneumonia ? ? ?Subjective : ?No events overnight. Started robinul for secretions.  ? ?Assessment and Plan: ?Cardiac arrest ([name])/ Postarrest anoxic encephalopathy / Postarrest respiratory failure s/p tracheostomy, S/P PEG tube placement ?Postarrest myoclonus: ?2D echocardiogram showed reduced LV function at <20%.  Now status post trach and PEG.   ?Remains obtunded with anoxic encephalopathy-on Keppra, Klonopin for breakthrough myoclonus.   ?At this time, plan is home with home health ?-Wife has reportedly completed education for trach and PEG tube ?-Enteral tube feeding trach kit and supplies to be arranged. PCCM managing tracheostomy. ?-Seen by palliative care  multiple times this admission, patient's wife continues to want full code and full scope of treatment. Will continue goals of care discussions.  ?-Plan for discharge home with extensive home health services, nursing care being arranged by TOC. ?Patient remains stable.  Afebrile. Repeat labs tmrw.  ?- Continue new robinul. ? 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  Continue supportive care. ? ?Acute respiratory failure ([place]) ?Status post trach collar.  Continue supportive care ?-On 5 L, satting 95%. D/w PCCM. There is no plan for decannulation. ? ?RLL pneumonia ?Previously had completed vancomycin for MSSA and [name] in early march.  Received more than 7-day course of IV Fortaz. ?On 4/3, CT chest was ordered that indicated an area of alveolar infiltration that may represent pneumonia.  Tracheal aspirate was normal flora.   ?-s/p Unasyn x 7 days for suspicion of aspiration. ?Remains stable.  Continue to monitor. Note tachypnea which is stable.  ? ?PE/DVT (deep venous thrombosis) ([place]) ?-CTA chest 4/4 noted bilateral PE ?-Duplex ultrasound of lower extremity on 05/15/2021 showed age indeterminate deep vein thrombosis involving the left femoral vein, left proximal profunda vein, left posterior tibial veins, left popliteal vein, and left peroneal veins.  ?-Was on Eliquis initially, briefly treated with Lovenox and then back on Eliquis now ?-Continue Eliquis ?- Repeated duplex (due to fever) and clot has not propagated.   ? ?Dysphagia ?Continue PEG tube feeding.  ?-Aspiration precautions ? ?Pressure injury of skin ?Posterior right heel, right foot. Not present on admission. ? ?HTN (hypertension) ?-Continue Coreg, Cardura, isosorbide dinitrate, hydralazine. BP up, will increase hydralazine dose starting this PM. ? ?HFrEF (heart failure with reduced ejection fraction) ([place]) ?2D echocardiogram-LVEF < 20%,  ?-Continue coreg,  imdur, spironolactone ?-Has required furosemide intermittently  for volume overload.  Seems to be stable currently. Will not redose lasix at this time. ? ?Myoclonus ?Seen by neurology.  Continue current AED with Klonopin, Keppra, valproic acid (level is 71 on 5/11).  ? ?Hypernatremia ?Resolved as of 06/09/2021 ? ?Hyperkalemia ?Resolved ? ?Overall poor prognosis ?Goals of care discussions ?-Remains unresponsive with trach, PEG, pressure wounds now ?-Seen by palliative care multiple times this admission, patient's wife continues to want full code and full scope of treatment ? ? ?DVT prophylaxis: apixaban (ELIQUIS) tablet 5 mg  ?  Code Status: Full Code ?Family Communication: No family at bedside. ?Disposition Plan: Home with home health services once arrangements completed by [facility] ?  ? ?Consultants:  ?PCCM ?Neurology ?Palliative care ?ID ?IR ?GI ?cards ? ?Objective: ?Vitals:  ? 07/13/21 0753 07/13/21 0800 07/13/21 1100 07/13/21 1307  ?BP: (!) 146/115 (!) 132/101 (!) 132/101 (!) 121/92  ?Pulse: 96  93 98  ?Resp: (!) 22 (!) 31 (!) 26 (!) 28  ?Temp:      ?TempSrc:      ?SpO2: 100%   100%  ?Weight:      ?Height:      ? ? ?Intake/Output Summary (Last 24 hours) at 07/13/2021 1437 ?Last data filed at 07/13/2021 0402 ?Gross per 24 hour  ?Intake 355 ml  ?Output 1450 ml  ?Net -1095 ml  ? ?Filed Weights  ? 07/12/21 0000 07/12/21 0433 07/13/21 0402  ?Weight: 94 kg 94 kg 92 kg  ? ?No distress ?Upper airway sounds clear with suction, no wheezes or crackles at bases ?RRR, no pitting edema ? ?Wounds: Please see nursing documentation ? ?Pressure Injury 05/25/21 Heel Posterior;Right Unstageable - Full thickness tissue loss in which the base of the injury is covered by slough (yellow, tan, gray, green or brown) and/or eschar (tan, brown or black) in the wound bed. (Active)  ?05/25/21 0800  ?Location: Heel  ?Location Orientation: Posterior;Right  ?Staging: Unstageable - Full thickness tissue loss in which the base of the injury is covered by slough (yellow, tan, gray, green or brown) and/or eschar (tan,  brown or black) in the wound bed.  ?Wound Description (Comments):   ?Present on Admission: No  ?Dressing Type Foam - Lift dressing to assess site every shift 07/11/21 2000  ?  ? ? ?  ? ? ?  ? ?Data Reviewed: I have personally reviewed following labs and imaging studies ? ?CBC: ?Recent Labs  ?Lab 07/07/21 ?3348 07/10/21 ?0414  ?WBC 7.1 5.4  ?HGB 12.3* 12.0*  ?HCT 38.1* 37.2*  ?MCV 95.7 96.1  ?PLT 184 220  ? ?Basic Metabolic Panel: ?Recent Labs  ?Lab 07/07/21 ?6019 07/10/21 ?0414  ?NA 134* 134*  ?K 4.2 4.4  ?CL 97* 100  ?CO2 28 29  ?GLUCOSE 111* 131*  ?BUN 39* 31*  ?CREATININE 0.65 0.68  ?CALCIUM 9.2 9.2  ? ?GFR: ?Estimated Creatinine Clearance: 125.9 mL/min (by C-G formula based on SCr of 0.68 mg/dL). ? ? ? ?Radiology Studies: ?No results found. ? ? ?Scheduled Meds: ? apixaban  5 mg Per Tube BID  ? atropine  2 drop Sublingual QID  ? carvedilol  25 mg Per Tube BID  ? chlorhexidine  15 mL Mouth/Throat BID  ? clonazepam  0.5 mg Per Tube TID  ? doxazosin  2 mg Per Tube Daily  ? feeding supplement (OSMOLITE 1.5 CAL)  355 mL Per Tube QID  ? feeding supplement (PROSource TF)  45 mL Per Tube BID  ? fiber  1 packet Per Tube BID  ? free water  300 mL Per Tube Q4H  ? Gerhardt's butt cream   Topical Daily  ? glycopyrrolate  1 mg Per Tube TID  ? hydrALAZINE  50 mg Per Tube Q8H  ? isosorbide dinitrate  30 mg Per Tube TID  ? levETIRAcetam  750 mg Per Tube BID  ? mouth rinse  15 mL Mouth Rinse q12n4p  ? nutrition supplement (JUVEN)  1 packet Per Tube BID BM  ? pantoprazole sodium  40 mg Per Tube QHS  ? scopolamine  1 patch Transdermal Q72H  ? spironolactone  25 mg Per Tube Daily  ? valproic acid  750 mg Per Tube TID  ? ?Continuous Infusions: ? ? ? ? LOS: 105 days  ? ? ?Patrecia Pour, MD ?[facility] ? ?07/13/2021, 2:37 PM   ?

## 2021-07-14 NOTE — Plan of Care (Signed)
?  Problem: Clinical Measurements: ?Goal: Will remain free from infection ?Outcome: Progressing ?Goal: Diagnostic test results will improve ?Outcome: Progressing ?Goal: Respiratory complications will improve ?Outcome: Progressing ?  ?Problem: Coping: ?Goal: Level of anxiety will decrease ?Outcome: Progressing ?  ?Problem: Elimination: ?Goal: Will not experience complications related to urinary retention ?Outcome: Progressing ?  ?Problem: Pain Managment: ?Goal: General experience of comfort will improve ?Outcome: Progressing ?  ?Problem: Safety: ?Goal: Ability to remain free from injury will improve ?Outcome: Progressing ?  ?Problem: Skin Integrity: ?Goal: Risk for impaired skin integrity will decrease ?Outcome: Progressing ?  ?

## 2021-07-14 NOTE — Progress Notes (Signed)
?PROGRESS NOTE ? ? ? ?Billy Cherry  [id number] DOB: August 09, 1970 DOA: 03/30/2021 ?PCP: Patient, No Pcp Per (Inactive)  ? ? ?Brief Narrative:  ?51 years old male with past medical history of hypertension was brought into the hospital after being found unresponsive by his wife.  Out of hospital V-fib arrest, received CPR, 1 shock with defibrillator, 2 doses of epinephrine with ROSC, EKG noted anterolateral STEMI ?1/19 admission, left heart cath without significant CAD.  Concern for anoxic brain injury ?1/21: MRI shows areas of hypoxic-ischemic injury bilaterally.  ?1/23 severe brain damage ?1/25: PEG . Neuro exam unchanged.  Does exhibit new eyelid tremor like movements and lip/jaw tremor like vs clonus movement.  Bolused Keppra and increased maintenance dose.  Repeat EEG. CTH: concerning for progression of anoxic injury.  ?1/26: Repeat EEG unchanged and continues to show myoclonic seizures despite addition of Valproic acid. [name] for continuous EEG ?1/28: Continues to have myoclonic sz on EEG ?2/6 New fever to 102, WBC 16K - treated with vancomycin for 7d based on cultures.  ?2/11: percutaneous tracheostomy ?2/20 Trach change to 6 cuffless ?3/13: eliquis started ?06/06/2021 CT of the chest with new PE and haziness consistent with pneumonia ? ? ?Subjective : ?No events overnight, still working on home health arrangements ? ?Assessment and Plan: ?Cardiac arrest ([name])/ Postarrest anoxic encephalopathy / Postarrest respiratory failure s/p tracheostomy, S/P PEG tube placement ?Postarrest myoclonus: ?2D echocardiogram showed reduced LV function at <20%.  Now status post trach and PEG.   ?Remains obtunded with anoxic encephalopathy-on Keppra, Klonopin for breakthrough myoclonus.   ?At this time, plan is home with home health ?-Wife has reportedly completed education for trach and PEG tube ?-Enteral tube feeding trach kit and supplies to be arranged. PCCM managing tracheostomy. ?-Seen by  palliative care multiple times this admission, patient's wife continues to want full code and full scope of treatment. Will continue goals of care discussions.  ?-Plan for discharge home with extensive home health services, nursing care being arranged by TOC. ?Patient remains stable.  Afebrile. Repeat labs tmrw.  ?- Continue new robinul. ? 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  Continue supportive care. ? ?Acute respiratory failure ([place]) ?Status post trach collar.  Continue supportive care ?-On 5 L, satting 95%. D/w PCCM. There is no plan for decannulation. ? ?RLL pneumonia ?Previously had completed vancomycin for MSSA and [name] in early march.  Received more than 7-day course of IV Fortaz. ?On 4/3, CT chest was ordered that indicated an area of alveolar infiltration that may represent pneumonia.  Tracheal aspirate was normal flora.   ?-s/p Unasyn x 7 days for suspicion of aspiration. ?Remains stable.  Continue to monitor. Note tachypnea which is stable.  ? ?PE/DVT (deep venous thrombosis) ([place]) ?-CTA chest 4/4 noted bilateral PE ?-Duplex ultrasound of lower extremity on 05/15/2021 showed age indeterminate deep vein thrombosis involving the left femoral vein, left proximal profunda vein, left posterior tibial veins, left popliteal vein, and left peroneal veins.  ?-Was on Eliquis initially, briefly treated with Lovenox and then back on Eliquis now ?-Continue Eliquis ?- Repeated duplex (due to fever) and clot has not propagated.   ? ?Dysphagia ?Continue PEG tube feeding.  ?-Aspiration precautions ? ?Pressure injury of skin ?Posterior right heel, right foot. Not present on admission. ? ?HTN (hypertension) ?-Continue Coreg, Cardura, isosorbide dinitrate, hydralazine. BP up, will increase hydralazine dose starting this PM. ? ?HFrEF (heart failure with reduced ejection fraction) ([place]) ?2D echocardiogram-LVEF < 20%,  ?-Continue  coreg, imdur, spironolactone ?-Has required furosemide  intermittently for volume overload.  Seems to be stable currently. Will not redose lasix at this time. ? ?Myoclonus ?Seen by neurology.  Continue current AED with Klonopin, Keppra, valproic acid (level is 71 on 5/11).  ? ?Hypernatremia ?Resolved as of 06/09/2021.  ?- Repeat labs in AM (roughly biweekly) ? ?Hyperkalemia ?Resolved ? ?Overall poor prognosis ?Goals of care discussions ?-Remains unresponsive with trach, PEG, pressure wounds now ?-Seen by palliative care multiple times this admission, patient's wife continues to want full code and full scope of treatment ? ? ?DVT prophylaxis: apixaban (ELIQUIS) tablet 5 mg  ?  Code Status: Full Code ?Family Communication: No family at bedside. ?Disposition Plan: Home with home health services once arrangements completed by [facility] ?  ? ?Consultants:  ?PCCM ?Neurology ?Palliative care ?ID ?IR ?GI ?cards ? ?Objective: ?Vitals:  ? 07/14/21 0446 07/14/21 0813 07/14/21 1328 07/14/21 1607  ?BP: (!) 126/98 (!) 151/115 (!) 120/100 115/87  ?Pulse:  88 94 (!) 101  ?Resp:  19 19 (!) 21  ?Temp:      ?TempSrc:      ?SpO2:  100% 100% 94%  ?Weight:      ?Height:      ? ? ?Intake/Output Summary (Last 24 hours) at 07/14/2021 1616 ?Last data filed at 07/14/2021 0900 ?Gross per 24 hour  ?Intake 5370 ml  ?Output 850 ml  ?Net 4520 ml  ? ?Filed Weights  ? 07/12/21 0433 07/13/21 0402 07/14/21 0040  ?Weight: 94 kg 92 kg 95 kg  ?Chronically ill-appearing male not meaningfully responsive ?Upper airway sounds, at bases no wheezes or crackles.  ?RRR, borderline tachycardia without murmur. No pitting edema ?Soft, NT, ND. PEG c/d/I ?No new wounds ? ?Wounds: Please see nursing documentation ? ?Pressure Injury 05/25/21 Heel Posterior;Right Unstageable - Full thickness tissue loss in which the base of the injury is covered by slough (yellow, tan, gray, green or brown) and/or eschar (tan, brown or black) in the wound bed. (Active)  ?05/25/21 0800  ?Location: Heel  ?Location Orientation: Posterior;Right  ?Staging:  Unstageable - Full thickness tissue loss in which the base of the injury is covered by slough (yellow, tan, gray, green or brown) and/or eschar (tan, brown or black) in the wound bed.  ?Wound Description (Comments):   ?Present on Admission: No  ?Dressing Type Foam - Lift dressing to assess site every shift 07/14/21 0800  ?  ? ? ?  ? ? ?  ? ?Data Reviewed: I have personally reviewed following labs and imaging studies ? ?CBC: ?Recent Labs  ?Lab 07/10/21 ?4287  ?WBC 5.4  ?HGB 12.0*  ?HCT 37.2*  ?MCV 96.1  ?PLT 220  ? ?Basic Metabolic Panel: ?Recent Labs  ?Lab 07/10/21 ?6811  ?NA 134*  ?K 4.4  ?CL 100  ?CO2 29  ?GLUCOSE 131*  ?BUN 31*  ?CREATININE 0.68  ?CALCIUM 9.2  ? ?GFR: ?Estimated Creatinine Clearance: 127.8 mL/min (by C-G formula based on SCr of 0.68 mg/dL). ? ? ? ?Radiology Studies: ?No results found. ? ? ?Scheduled Meds: ? apixaban  5 mg Per Tube BID  ? atropine  2 drop Sublingual QID  ? carvedilol  25 mg Per Tube BID  ? chlorhexidine  15 mL Mouth/Throat BID  ? clonazepam  0.5 mg Per Tube TID  ? doxazosin  2 mg Per Tube Daily  ? feeding supplement (OSMOLITE 1.5 CAL)  355 mL Per Tube QID  ? feeding supplement (PROSource TF)  45 mL Per Tube BID  ?  fiber  1 packet Per Tube BID  ? free water  300 mL Per Tube Q4H  ? Gerhardt's butt cream   Topical Daily  ? glycopyrrolate  1 mg Per Tube TID  ? hydrALAZINE  50 mg Per Tube Q8H  ? isosorbide dinitrate  30 mg Per Tube TID  ? levETIRAcetam  750 mg Per Tube BID  ? mouth rinse  15 mL Mouth Rinse q12n4p  ? nutrition supplement (JUVEN)  1 packet Per Tube BID BM  ? pantoprazole sodium  40 mg Per Tube QHS  ? scopolamine  1 patch Transdermal Q72H  ? spironolactone  25 mg Per Tube Daily  ? valproic acid  750 mg Per Tube TID  ? ?Continuous Infusions: ? ? ? ? LOS: 106 days  ? ? ?Patrecia Pour, MD ?[facility] ? ?07/14/2021, 4:16 PM   ?

## 2021-07-14 NOTE — Plan of Care (Signed)
°  Problem: Clinical Measurements: °Goal: Respiratory complications will improve °Outcome: Progressing °  °Problem: Pain Managment: °Goal: General experience of comfort will improve °Outcome: Progressing °  °Problem: Safety: °Goal: Ability to remain free from injury will improve °Outcome: Progressing °  °

## 2021-07-14 NOTE — Procedures (Signed)
Bedside Tracheostomy Insertion Procedure Note ? ? ?Patient Details:   ?Name: Billy Cherry ?DOB: December 29, 1970 ?MRN: [id number] ? ?Procedure: Tracheostomy ? ?Pre Procedure Assessment: ?ET Tube Size: ?ET Tube secured at lip (cm): ?Bite block in place: No ?Breath Sounds: Diminished and Rhonch ? ?Post Procedure Assessment: ?BP (!) 120/100   Pulse 94   Temp 99.5 ?F (37.5 ?C) (Oral)   Resp 19   Ht 5\' 10"  (1.778 m)   Wt 95 kg   SpO2 100%   BMI 30.05 kg/m?  ?O2 sats: stable throughout ?Complications: No apparent complications ?Patient did tolerate procedure well ?Tracheostomy Brand:Shiley ?Tracheostomy Style:Uncuffed ?Tracheostomy Size:  6 ?Tracheostomy Secured [id number] ?Tracheostomy Placement Confirmation:Trach cuff visualized and in place ? Pt Tracheostomy tube was replaced per protocol. Pt currently has 104mm Shiley Flex uncuffed. CO2 confirmed placement and bilateral breath sounds were heard. Pt is currently stable on Trach collar 5L 28% RT will continue to monitor. ? ? ?Felecia Jan ?07/14/2021, 1:47 PM ? ?    ? ? ?

## 2021-07-15 LAB — CBC
HCT: 36.1 % — ABNORMAL LOW (ref 39.0–52.0)
Hemoglobin: 11.7 g/dL — ABNORMAL LOW (ref 13.0–17.0)
MCH: 30.5 pg (ref 26.0–34.0)
MCHC: 32.4 g/dL (ref 30.0–36.0)
MCV: 94.3 fL (ref 80.0–100.0)
Platelets: 223 10*3/uL (ref 150–400)
RBC: 3.83 MIL/uL — ABNORMAL LOW (ref 4.22–5.81)
RDW: 13.5 % (ref 11.5–15.5)
WBC: 5.8 10*3/uL (ref 4.0–10.5)
nRBC: 0 % (ref 0.0–0.2)

## 2021-07-15 LAB — COMPREHENSIVE METABOLIC PANEL
ALT: 12 U/L (ref 0–44)
AST: 18 U/L (ref 15–41)
Albumin: 2.3 g/dL — ABNORMAL LOW (ref 3.5–5.0)
Alkaline Phosphatase: 54 U/L (ref 38–126)
Anion gap: 8 (ref 5–15)
BUN: 38 mg/dL — ABNORMAL HIGH (ref 6–20)
CO2: 28 mmol/L (ref 22–32)
Calcium: 9 mg/dL (ref 8.9–10.3)
Chloride: 95 mmol/L — ABNORMAL LOW (ref 98–111)
Creatinine, Ser: 0.68 mg/dL (ref 0.61–1.24)
GFR, Estimated: >60 mL/min (ref >60)
Glucose, Bld: 102 mg/dL — ABNORMAL HIGH (ref 70–99)
Potassium: 4.5 mmol/L (ref 3.5–5.1)
Sodium: 131 mmol/L — ABNORMAL LOW (ref 135–145)
Total Bilirubin: 0.7 mg/dL (ref 0.3–1.2)
Total Protein: 6.2 g/dL — ABNORMAL LOW (ref 6.5–8.1)

## 2021-07-15 MED ORDER — FREE WATER
200.0000 mL | Status: DC
  Administered 2021-07-15 – 2021-07-27 (×69): 200 mL

## 2021-07-15 NOTE — Progress Notes (Signed)
?PROGRESS NOTE ? ? ? ?Yuri Fana  BOF:[id number] DOB: 1970/07/11 DOA: 03/30/2021 ?PCP: Patient, No Pcp Per (Inactive)  ? ? ?Brief Narrative:  ?51 years old male with past medical history of hypertension was brought into the hospital after being found unresponsive by his wife.  Out of hospital V-fib arrest, received CPR, 1 shock with defibrillator, 2 doses of epinephrine with ROSC, EKG noted anterolateral STEMI ?1/19 admission, left heart cath without significant CAD.  Concern for anoxic brain injury ?1/21: MRI shows areas of hypoxic-ischemic injury bilaterally.  ?1/23 severe brain damage ?1/25: PEG . Neuro exam unchanged.  Does exhibit new eyelid tremor like movements and lip/jaw tremor like vs clonus movement.  Bolused Keppra and increased maintenance dose.  Repeat EEG. CTH: concerning for progression of anoxic injury.  ?1/26: Repeat EEG unchanged and continues to show myoclonic seizures despite addition of Valproic acid. [name] for [facility] EEG ?1/28: Continues to have myoclonic sz on EEG ?2/6 New fever to 102, WBC 16K - treated with vancomycin for 7d based on cultures.  ?2/11: percutaneous tracheostomy ?2/20 Trach change to 6 cuffless ?3/13: eliquis started ?06/06/2021 CT of the chest with new PE and haziness consistent with pneumonia ? ? ?Subjective : ?No overnight events.  ? ?Assessment and Plan: ?Cardiac arrest ([name])/ Postarrest anoxic encephalopathy / Postarrest respiratory failure s/p tracheostomy, S/P PEG tube placement ?Postarrest myoclonus: ?2D echocardiogram showed reduced LV function at <20%.  Now status post trach and PEG.   ?Remains obtunded with anoxic encephalopathy-on Keppra, Klonopin for breakthrough myoclonus.   ?At this time, plan is home with home health ?-Wife has reportedly completed education for trach and PEG tube ?-Enteral tube feeding trach kit and supplies to be arranged. PCCM managing tracheostomy. ?-Seen by palliative care multiple times this admission,  patient's wife continues to want full code and full scope of treatment. Will continue goals of care discussions.  ?-Plan for discharge home with extensive home health services, nursing care being arranged by TOC. ?Patient remains stable.  Afebrile. Labs are completely stable. No leukocytosis.  ?- Continue new robinul. ? 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  Continue supportive care. ? ?Acute respiratory failure ([place]) ?Status post trach collar.  Continue supportive care ?-On 5 L, satting 95%. D/w PCCM. There is no plan for decannulation. ? ?RLL pneumonia ?Previously had completed vancomycin for MSSA and [name] in early march.  Received more than 7-day course of IV Fortaz. ?On 4/3, CT chest was ordered that indicated an area of alveolar infiltration that may represent pneumonia.  Tracheal aspirate was normal flora.   ?-s/p Unasyn x 7 days for suspicion of aspiration. ?Remains stable.  Continue to monitor. Note tachypnea which is stable.  ? ?PE/DVT (deep venous thrombosis) ([place]) ?-CTA chest 4/4 noted bilateral PE ?-Duplex ultrasound of lower extremity on 05/15/2021 showed age indeterminate deep vein thrombosis involving the left femoral vein, left proximal profunda vein, left posterior tibial veins, left popliteal vein, and left peroneal veins.  ?-Was on Eliquis initially, briefly treated with Lovenox and then back on Eliquis now ?-Continue Eliquis ?- Repeated duplex (due to fever) and clot has not propagated.   ? ?Dysphagia ?Continue PEG tube feeding.  ?-Aspiration precautions ? ?Pressure injury of skin ?Posterior right heel, right foot. Not present on admission. ? ?HTN (hypertension) ?-Continue Coreg, Cardura, isosorbide dinitrate, hydralazine. BP up, will increase hydralazine dose starting this PM. ? ?HFrEF (heart failure with reduced ejection fraction) ([place]) ?2D echocardiogram-LVEF < 20%,  ?-Continue coreg, imdur,  spironolactone ?-Has required furosemide intermittently for  volume overload.  Seems to be stable currently. Will not redose lasix at this time. ? ?Myoclonus ?Seen by neurology.  Continue current AED with Klonopin, Keppra, valproic acid (level is 71 on 5/11).  ? ?Hypernatremia ?Resolved as of 06/09/2021.  ?- Hyponatremia on labs today. Will decrease free water. ? ?Hyperkalemia ?Resolved ? ?Overall poor prognosis ?Goals of care discussions ?-Remains unresponsive with trach, PEG, pressure wounds now ?-Seen by palliative care multiple times this admission, patient's wife continues to want full code and full scope of treatment ? ? ?DVT prophylaxis: apixaban (ELIQUIS) tablet 5 mg  ?  Code Status: Full Code ?Family Communication: No family at bedside. ?Disposition Plan: Home with home health services once arrangements completed by [facility] ?  ? ?Consultants:  ?PCCM ?Neurology ?Palliative care ?ID ?IR ?GI ?cards ? ?Objective: ?Vitals:  ? 07/15/21 0644 07/15/21 0800 07/15/21 0858 07/15/21 1129  ?BP: (!) 136/99 (!) 129/95    ?Pulse:  97    ?Resp:  (!) 22    ?Temp:  98.9 ?F (37.2 ?C)    ?TempSrc:  Axillary    ?SpO2:  97% 100% 100%  ?Weight:      ?Height:      ? ? ?Intake/Output Summary (Last 24 hours) at 07/15/2021 1414 ?Last data filed at 07/15/2021 [phone] ?Gross per 24 hour  ?Intake 0 ml  ?Output 2250 ml  ?Net -2250 ml  ? ?Filed Weights  ? 07/13/21 0402 07/14/21 0040 07/15/21 0202  ?Weight: 92 kg 95 kg 94 kg  ?Chronically ill-appearing male not meaningfully responsive ?Coarse, nonlabored w/trach collar, 5L ?RRR, no MRG. No pitting edema ?PEG site is c/d/i ?No new wounds ? ?Wounds: Please see nursing documentation ? ?Pressure Injury 05/25/21 Heel Posterior;Right Unstageable - Full thickness tissue loss in which the base of the injury is covered by slough (yellow, tan, gray, green or brown) and/or eschar (tan, brown or black) in the wound bed. (Active)  ?05/25/21 0800  ?Location: Heel  ?Location Orientation: Posterior;Right  ?Staging: Unstageable - Full thickness tissue loss in which the base of  the injury is covered by slough (yellow, tan, gray, green or brown) and/or eschar (tan, brown or black) in the wound bed.  ?Wound Description (Comments):   ?Present on Admission: No  ?Dressing Type Foam - Lift dressing to assess site every shift 07/14/21 2000  ?  ? ? ?  ? ? ?  ? ?Data Reviewed: I have personally reviewed following labs and imaging studies ? ?CBC: ?Recent Labs  ?Lab 07/10/21 ?0414 07/15/21 ?0441  ?WBC 5.4 5.8  ?HGB 12.0* 11.7*  ?HCT 37.2* 36.1*  ?MCV 96.1 94.3  ?PLT 220 223  ? ?Basic Metabolic Panel: ?Recent Labs  ?Lab 07/10/21 ?0414 07/15/21 ?0441  ?NA 134* 131*  ?K 4.4 4.5  ?CL 100 95*  ?CO2 29 28  ?GLUCOSE 131* 102*  ?BUN 31* 38*  ?CREATININE 0.68 0.68  ?CALCIUM 9.2 9.0  ? ?GFR: ?Estimated Creatinine Clearance: 127.2 mL/min (by C-G formula based on SCr of 0.68 mg/dL). ? ? ? ?Radiology Studies: ?No results found. ? ? ?Scheduled Meds: ? apixaban  5 mg Per Tube BID  ? atropine  2 drop Sublingual QID  ? carvedilol  25 mg Per Tube BID  ? chlorhexidine  15 mL Mouth/Throat BID  ? clonazepam  0.5 mg Per Tube TID  ? doxazosin  2 mg Per Tube Daily  ? feeding supplement (OSMOLITE 1.5 CAL)  355 mL Per Tube QID  ? feeding supplement (  PROSource TF)  45 mL Per Tube BID  ? fiber  1 packet Per Tube BID  ? free water  300 mL Per Tube Q4H  ? Gerhardt's butt cream   Topical Daily  ? glycopyrrolate  1 mg Per Tube TID  ? hydrALAZINE  50 mg Per Tube Q8H  ? isosorbide dinitrate  30 mg Per Tube TID  ? levETIRAcetam  750 mg Per Tube BID  ? mouth rinse  15 mL Mouth Rinse q12n4p  ? nutrition supplement (JUVEN)  1 packet Per Tube BID BM  ? pantoprazole sodium  40 mg Per Tube QHS  ? scopolamine  1 patch Transdermal Q72H  ? spironolactone  25 mg Per Tube Daily  ? valproic acid  750 mg Per Tube TID  ? ?Continuous Infusions: ? ? ? ? LOS: 107 days  ? ? ?Patrecia Pour, MD ?[facility] ? ?07/15/2021, 2:14 PM   ?

## 2021-07-16 NOTE — Plan of Care (Signed)
  Problem: Clinical Measurements: Goal: Will remain free from infection Outcome: Progressing   Problem: Coping: Goal: Level of anxiety will decrease Outcome: Progressing   Problem: Elimination: Goal: Will not experience complications related to bowel motility Outcome: Progressing Goal: Will not experience complications related to urinary retention Outcome: Progressing   Problem: Pain Managment: Goal: General experience of comfort will improve Outcome: Progressing   Problem: Safety: Goal: Ability to remain free from injury will improve Outcome: Progressing   

## 2021-07-16 NOTE — Progress Notes (Signed)
?PROGRESS NOTE ? ? ? ?Billy Cherry  OXB:[id number] DOB: Nov 15, 1970 DOA: 03/30/2021 ?PCP: Patient, No Pcp Per (Inactive)  ? ? ?Brief Narrative:  ?51 years old male with past medical history of hypertension was brought into the hospital after being found unresponsive by his wife.  Out of hospital V-fib arrest, received CPR, 1 shock with defibrillator, 2 doses of epinephrine with ROSC, EKG noted anterolateral STEMI ?1/19 admission, left heart cath without significant CAD.  Concern for anoxic brain injury ?1/21: MRI shows areas of hypoxic-ischemic injury bilaterally.  ?1/23 severe brain damage ?1/25: PEG . Neuro exam unchanged.  Does exhibit new eyelid tremor like movements and lip/jaw tremor like vs clonus movement.  Bolused Keppra and increased maintenance dose.  Repeat EEG. CTH: concerning for progression of anoxic injury.  ?1/26: Repeat EEG unchanged and continues to show myoclonic seizures despite addition of Valproic acid. [name] for [facility] EEG ?1/28: Continues to have myoclonic sz on EEG ?2/6 New fever to 102, WBC 16K - treated with vancomycin for 7d based on cultures.  ?2/11: percutaneous tracheostomy ?2/20 Trach change to 6 cuffless ?3/13: eliquis started ?06/06/2021 CT of the chest with new PE and haziness consistent with pneumonia ? ? ?Subjective : ?No overnight events reported ? ?Assessment and Plan: ?Cardiac arrest ([name])/ Postarrest anoxic encephalopathy / Postarrest respiratory failure s/p tracheostomy, S/P PEG tube placement ?Postarrest myoclonus: ?2D echocardiogram showed reduced LV function at <20%.  Now status post trach and PEG.   ?Remains obtunded with anoxic encephalopathy-on Keppra, Klonopin for breakthrough myoclonus.   ?At this time, plan is home with home health ?-Wife has reportedly completed education for trach and PEG tube ?-Enteral tube feeding trach kit and supplies to be arranged. PCCM managing tracheostomy. ?-Seen by palliative care multiple times this  admission, patient's wife continues to want full code and full scope of treatment. Will continue goals of care discussions.  ?-Plan for discharge home with extensive home health services, nursing care being arranged by TOC. ?Patient remains stable.  Afebrile. Labs are completely stable. No leukocytosis. Will recheck intermittently. ?- Continue new robinul. ? 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  Continue supportive care. ? ?Acute respiratory failure ([place]) ?Status post trach collar.  Continue supportive care ?-On 5 L, satting 95%. D/w PCCM. There is no plan for decannulation. ? ?RLL pneumonia ?Previously had completed vancomycin for MSSA and [name] in early march.  Received more than 7-day course of IV Fortaz. ?On 4/3, CT chest was ordered that indicated an area of alveolar infiltration that may represent pneumonia.  Tracheal aspirate was normal flora.   ?-s/p Unasyn x 7 days for suspicion of aspiration. ?Remains stable.  Continue to monitor. Note tachypnea which is stable.  ? ?PE/DVT (deep venous thrombosis) ([place]) ?-CTA chest 4/4 noted bilateral PE ?-Duplex ultrasound of lower extremity on 05/15/2021 showed age indeterminate deep vein thrombosis involving the left femoral vein, left proximal profunda vein, left posterior tibial veins, left popliteal vein, and left peroneal veins.  ?-Was on Eliquis initially, briefly treated with Lovenox and then back on Eliquis now ?-Continue Eliquis ?- Repeated duplex (due to fever) and clot has not propagated.   ? ?Dysphagia ?Continue PEG tube feeding.  ?-Aspiration precautions ? ?Pressure injury of skin ?Posterior right heel, right foot. Not present on admission. ? ?HTN (hypertension) ?-Continue Coreg, Cardura, isosorbide dinitrate, hydralazine. BP up, will increase hydralazine dose starting this PM. ? ?HFrEF (heart failure with reduced ejection fraction) ([place]) ?2D echocardiogram-LVEF < 20%,  ?-Continue  coreg, imdur, spironolactone ?-Has  required furosemide intermittently for volume overload.  Seems to be stable currently. Will not redose lasix at this time. ? ?Myoclonus ?Seen by neurology.  Continue current AED with Klonopin, Keppra, valproic acid (level is 71 on 5/11).  ? ?Hypernatremia ?Resolved as of 06/09/2021.  ?- Hyponatremia on labs today. Will decrease free water. ? ?Hyperkalemia ?Resolved ? ?Overall poor prognosis ?Goals of care discussions ?-Remains unresponsive with trach, PEG, pressure wounds now ?-Seen by palliative care multiple times this admission, patient's wife continues to want full code and full scope of treatment ? ? ?DVT prophylaxis: apixaban (ELIQUIS) tablet 5 mg  ?  Code Status: Full Code ?Family Communication: No family at bedside. ?Disposition Plan: Home with home health services once arrangements completed by [facility] ?  ? ?Consultants:  ?PCCM ?Neurology ?Palliative care ?ID ?IR ?GI ?cards ? ?Objective: ?Vitals:  ? 07/15/21 2306 07/16/21 0354 07/16/21 0801 07/16/21 1358  ?BP:  (!) 152/107 (!) 153/109 (!) 148/107  ?Pulse: 98 (!) 101 100 91  ?Resp: (!) 26 18 (!) 24 12  ?Temp:  98.2 ?F (36.8 ?C)    ?TempSrc:  Axillary    ?SpO2: 97% 97% 96% 97%  ?Weight:  94.6 kg    ?Height:      ? ? ?Intake/Output Summary (Last 24 hours) at 07/16/2021 1439 ?Last data filed at 07/16/2021 1345 ?Gross per 24 hour  ?Intake 1155 ml  ?Output 3100 ml  ?Net -1945 ml  ? ?Filed Weights  ? 07/14/21 0040 07/15/21 0202 07/16/21 0354  ?Weight: 95 kg 94 kg 94.6 kg  ?Chronically ill-appearing male not meaningfully responsive ?Coarse, nonlabored w/trach collar, 5L ?RRR, no MRG. No pitting edema ?PEG site is c/d/i ?No new wounds ? ?Wounds: Please see nursing documentation ? ?Pressure Injury 05/25/21 Heel Posterior;Right Unstageable - Full thickness tissue loss in which the base of the injury is covered by slough (yellow, tan, gray, green or brown) and/or eschar (tan, brown or black) in the wound bed. (Active)  ?05/25/21 0800  ?Location: Heel  ?Location Orientation:  Posterior;Right  ?Staging: Unstageable - Full thickness tissue loss in which the base of the injury is covered by slough (yellow, tan, gray, green or brown) and/or eschar (tan, brown or black) in the wound bed.  ?Wound Description (Comments):   ?Present on Admission: No  ?Dressing Type Foam - Lift dressing to assess site every shift 07/15/21 2100  ?  ? ? ?  ? ? ?  ? ?Data Reviewed: I have personally reviewed following labs and imaging studies ? ?CBC: ?Recent Labs  ?Lab 07/10/21 ?0414 07/15/21 ?0441  ?WBC 5.4 5.8  ?HGB 12.0* 11.7*  ?HCT 37.2* 36.1*  ?MCV 96.1 94.3  ?PLT 220 223  ? ?Basic Metabolic Panel: ?Recent Labs  ?Lab 07/10/21 ?0414 07/15/21 ?0441  ?NA 134* 131*  ?K 4.4 4.5  ?CL 100 95*  ?CO2 29 28  ?GLUCOSE 131* 102*  ?BUN 31* 38*  ?CREATININE 0.68 0.68  ?CALCIUM 9.2 9.0  ? ?GFR: ?Estimated Creatinine Clearance: 127.5 mL/min (by C-G formula based on SCr of 0.68 mg/dL). ? ? ? ?Radiology Studies: ?No results found. ? ? ?Scheduled Meds: ? apixaban  5 mg Per Tube BID  ? atropine  2 drop Sublingual QID  ? carvedilol  25 mg Per Tube BID  ? chlorhexidine  15 mL Mouth/Throat BID  ? clonazepam  0.5 mg Per Tube TID  ? doxazosin  2 mg Per Tube Daily  ? feeding supplement (OSMOLITE 1.5 CAL)  355 mL Per  Tube QID  ? feeding supplement (PROSource TF)  45 mL Per Tube BID  ? fiber  1 packet Per Tube BID  ? free water  200 mL Per Tube Q4H  ? Gerhardt's butt cream   Topical Daily  ? glycopyrrolate  1 mg Per Tube TID  ? hydrALAZINE  50 mg Per Tube Q8H  ? isosorbide dinitrate  30 mg Per Tube TID  ? levETIRAcetam  750 mg Per Tube BID  ? mouth rinse  15 mL Mouth Rinse q12n4p  ? nutrition supplement (JUVEN)  1 packet Per Tube BID BM  ? pantoprazole sodium  40 mg Per Tube QHS  ? scopolamine  1 patch Transdermal Q72H  ? spironolactone  25 mg Per Tube Daily  ? valproic acid  750 mg Per Tube TID  ? ?Continuous Infusions: ? ? ? ? LOS: 108 days  ? ? ?Patrecia Pour, MD ?[facility] ? ?07/16/2021, 2:39 PM   ?

## 2021-07-17 NOTE — Progress Notes (Signed)
?  NAME:  Billy Cherry, MRN:  [id number], DOB:  1970-06-16, LOS: 109 ?ADMISSION DATE:  03/30/2021, CONSULTATION DATE:  1/19 ?REFERRING MD:  [place], CHIEF COMPLAINT:  Found down, cardiac arrest  ? ?Brief Pt Description / Synopsis:  ?51 year old male with out-of-hospital V. fib cardiac arrest in setting of respiratory arrest due to suspected angioedema from ACE inhibitor leading to asphyxiation.  Concern on his 12 lead EKG for anterolateral ST elevation so he was taken to the cath lab where his study showed no significant coronary artery disease and an LVEF < 20%  Now with anoxic brain injury. ? ?Pertinent  Medical History  ?Hypertension ? ?Significant Hospital Events: ?Including procedures, antibiotic start and stop dates in addition to other pertinent events   ?1/19 admission, left heart cath without significant CAD.  Concern for anoxic brain injury ?1/20: MRI pending, Neuro following ?1/21: MRI shows areas of hypoxic-ischemic injury bilaterally. Fevered; cultures obtained. ?1/23 severe brain damage ?1/24 evidence of severe brain damage, remains critically ill ?1/25: Plan for PEG today. Neuro exam unchanged.  Does exhibit new eyelid tremor like movements and lip/jaw tremor like vs clonus movement.  Bolused Keppra and increased maintenance dose.  Repeat EEG. CTH: concerning for progression of anoxic injury.  ?1/26: Repeat EEG unchanged and continues to show myoclonic seizures despite addition of Valproic acid yesterday. transfer to [facility] for continuous EEG ?1/28: Continues to have myoclonic sz on EEG ?1/29 No acute issues overnight ?2/3 no meaningful neuro recovery. Diuresed ?2/5 unchanged ?2/6 New fever to 102, WBC 16K - treated with vancomycin for 7d based on cultures.  ?2/11: percutaneous tracheostomy ?2/13 >2/20 on trach collar. No vent.  ?2/20 Trach change to 6 cuffless ?2/27 status unchanged ?3/13 unchanged ?3/20 due for trach exchange given continue secretions (although improved) will stick to 6 cuffless  for exchange  ?06/05/2021 Continued thick  secretions, scant blood tinged 4/3/am ?06/06/2021 CT of the chest with new PE and haziness consistent with pneumonia ? ?Interim History / Subjective:  ?No distress ?Remains comatose ? ?Objective   ?Blood pressure (!) 141/106, pulse 88, temperature 98.8 ?F (37.1 ?C), temperature source Oral, resp. rate 18, height 5\' 10"  (1.778 m), weight 95 kg, SpO2 99 %. ?   ?FiO2 (%):  [28 %] 28 %  ? ?Intake/Output Summary (Last 24 hours) at 07/17/2021 1037 ?Last data filed at 07/17/2021 0600 ?Gross per 24 hour  ?Intake 710 ml  ?Output 1150 ml  ?Net -440 ml  ? ? ? ?Filed Weights  ? 07/15/21 0202 07/16/21 0354 07/17/21 0400  ?Weight: 94 kg 94.6 kg 95 kg  ? ?Examination:  ?Dilated fixed pupils ?GCS3 ?No resp distress on trach collar ?Minimal secretions ?All unchanged ? ?Ancillary tests personally reviewed:    ? ?Assessment & Plan:  ?OOH Vfib arrest ?Post arrest anoxic brain injury, severe ?Post arrest myoclonus improved with klonopin ?Post arrest respiratory failure with prolonged mechanical ventilation s/p tracheostomy ?Post arrest cardiomyopathy ?Post arrest AKI, resolved ?HTN- stable on current regimen ?PE- on eliquis ? ?Plan:  ?Nothing new to add ?Continue trach collar ?Continue eliquis indefinitely as high risk of recurrence ?Grim prognosis ? ?Will see weekly, next 5/22 ? ?6/22 MD PCCM ? ?

## 2021-07-17 NOTE — Progress Notes (Signed)
?PROGRESS NOTE ? ? ? ?Billy Cherry  GLO:[id number] DOB: 02-01-1971 DOA: 03/30/2021 ?PCP: Patient, No Pcp Per (Inactive)  ? ? ?Brief Narrative:  ?51 years old male with past medical history of hypertension was brought into the hospital after being found unresponsive by his wife.  Out of hospital V-fib arrest, received CPR, 1 shock with defibrillator, 2 doses of epinephrine with ROSC, EKG noted anterolateral STEMI ?1/19 admission, left heart cath without significant CAD.  Concern for anoxic brain injury ?1/21: MRI shows areas of hypoxic-ischemic injury bilaterally.  ?1/23 severe brain damage ?1/25: PEG . Neuro exam unchanged.  Does exhibit new eyelid tremor like movements and lip/jaw tremor like vs clonus movement.  Bolused Keppra and increased maintenance dose.  Repeat EEG. CTH: concerning for progression of anoxic injury.  ?1/26: Repeat EEG unchanged and continues to show myoclonic seizures despite addition of Valproic acid. [name] for [facility] EEG ?1/28: Continues to have myoclonic sz on EEG ?2/6 New fever to 102, WBC 16K - treated with vancomycin for 7d based on cultures.  ?2/11: percutaneous tracheostomy ?2/20 Trach change to 6 cuffless ?3/13: eliquis started ?06/06/2021 CT of the chest with new PE and haziness consistent with pneumonia ? ? ?Subjective : ?Pt nonverbal, staff not reporting any new issues.  ? ?Assessment and Plan: ?Cardiac arrest ([name])/ Postarrest anoxic encephalopathy / Postarrest respiratory failure s/p tracheostomy, S/P PEG tube placement ?Postarrest myoclonus: ?2D echocardiogram showed reduced LV function at <20%.  Now status post trach and PEG.   ?Remains obtunded with anoxic encephalopathy-on Keppra, Klonopin for breakthrough myoclonus.   ?At this time, plan is home with home health ?-Wife has reportedly completed education for trach and PEG tube ?-Enteral tube feeding trach kit and supplies to be arranged. PCCM managing tracheostomy. ?-Seen by palliative care  multiple times this admission, patient's wife continues to want full code and full scope of treatment. Will continue goals of care discussions.  ?-Plan for discharge home with extensive home health services, nursing care being arranged by TOC. ?Patient remains stable.  Afebrile. Labs are completely stable. No leukocytosis. Will recheck intermittently. ?- Continue new robinul. ? 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  Continue supportive care. ? ?Acute respiratory failure ([place]) ?Status post trach collar.  Continue supportive care ?-On 5 L, satting 95%. D/w PCCM. There is no plan for decannulation. ? ?RLL pneumonia ?Previously had completed vancomycin for MSSA and [name] in early march.  Received more than 7-day course of IV Fortaz. ?On 4/3, CT chest was ordered that indicated an area of alveolar infiltration that may represent pneumonia.  Tracheal aspirate was normal flora.   ?-s/p Unasyn x 7 days for suspicion of aspiration. ?Remains stable.  Continue to monitor. Note tachypnea which is stable.  ? ?PE/DVT (deep venous thrombosis) ([place]) ?-CTA chest 4/4 noted bilateral PE ?-Duplex ultrasound of lower extremity on 05/15/2021 showed age indeterminate deep vein thrombosis involving the left femoral vein, left proximal profunda vein, left posterior tibial veins, left popliteal vein, and left peroneal veins.  ?-Was on Eliquis initially, briefly treated with Lovenox and then back on Eliquis now ?-Continue Eliquis ?- Repeated duplex (due to fever) and clot has not propagated.   ? ?Dysphagia ?Continue PEG tube feeding.  ?-Aspiration precautions ? ?Pressure injury of skin ?Posterior right heel, right foot. Not present on admission. ? ?HTN (hypertension) ?-Continue Coreg, Cardura, isosorbide dinitrate, hydralazine. BP up, will increase hydralazine dose starting this PM. ? ?HFrEF (heart failure with reduced ejection fraction) ([place]) ?2D  echocardiogram-LVEF < 20%,  ?-Continue coreg, imdur,  spironolactone ?-Has required furosemide intermittently for volume overload.  Seems to be stable currently. Will not redose lasix at this time. ? ?Myoclonus ?Seen by neurology.  Continue current AED with Klonopin, Keppra, valproic acid (level is 71 on 5/11).  ? ?Hypernatremia ?Resolved as of 06/09/2021.  ?- Hyponatremia on labs today. Will decrease free water. ? ?Hyperkalemia ?Resolved ? ?Overall poor prognosis ?Goals of care discussions ?-Remains unresponsive with trach, PEG, pressure wounds now ?-Seen by palliative care multiple times this admission, patient's wife continues to want full code and full scope of treatment ? ? ?DVT prophylaxis: apixaban (ELIQUIS) tablet 5 mg  ?  Code Status: Full Code ?Family Communication: No family at bedside. ?Disposition Plan: Home with home health services once arrangements completed by TOC. Still pending.  ? ?Consultants:  ?PCCM ?Neurology ?Palliative care ?ID ?IR ?GI ?cards ? ?Objective: ?Vitals:  ? 07/17/21 0823 07/17/21 0953 07/17/21 1100 07/17/21 1131  ?BP:  (!) 141/106  119/85  ?Pulse: 96 88  89  ?Resp: 18  (!) 21 (!) 25  ?Temp:    98.9 ?F (37.2 ?C)  ?TempSrc:    Oral  ?SpO2:    97%  ?Weight:      ?Height:      ? ? ?Intake/Output Summary (Last 24 hours) at 07/17/2021 1317 ?Last data filed at 07/17/2021 1218 ?Gross per 24 hour  ?Intake 710 ml  ?Output 1350 ml  ?Net -640 ml  ? ?Filed Weights  ? 07/15/21 0202 07/16/21 0354 07/17/21 0400  ?Weight: 94 kg 94.6 kg 95 kg  ?Chronicall ill-appearing male in no distress ?Clear, nonlabored thru trach collar, mild secretions. ?RRR, no MRG or edema ?No new wounds ?Abd soft ? ?Wounds: Please see nursing documentation ? ?Pressure Injury 05/25/21 Heel Posterior;Right Unstageable - Full thickness tissue loss in which the base of the injury is covered by slough (yellow, tan, gray, green or brown) and/or eschar (tan, brown or black) in the wound bed. (Active)  ?05/25/21 0800  ?Location: Heel  ?Location Orientation: Posterior;Right  ?Staging:  Unstageable - Full thickness tissue loss in which the base of the injury is covered by slough (yellow, tan, gray, green or brown) and/or eschar (tan, brown or black) in the wound bed.  ?Wound Description (Comments):   ?Present on Admission: No  ?Dressing Type Foam - Lift dressing to assess site every shift 07/16/21 2300  ?  ? ? ?  ? ? ?  ? ?Data Reviewed: I have personally reviewed following labs and imaging studies ? ?CBC: ?Recent Labs  ?Lab 07/15/21 ?0441  ?WBC 5.8  ?HGB 11.7*  ?HCT 36.1*  ?MCV 94.3  ?PLT 223  ? ?Basic Metabolic Panel: ?Recent Labs  ?Lab 07/15/21 ?0441  ?NA 131*  ?K 4.5  ?CL 95*  ?CO2 28  ?GLUCOSE 102*  ?BUN 38*  ?CREATININE 0.68  ?CALCIUM 9.0  ? ?GFR: ?Estimated Creatinine Clearance: 127.8 mL/min (by C-G formula based on SCr of 0.68 mg/dL). ? ? ? ?Radiology Studies: ?No results found. ? ? ?Scheduled Meds: ? apixaban  5 mg Per Tube BID  ? atropine  2 drop Sublingual QID  ? carvedilol  25 mg Per Tube BID  ? chlorhexidine  15 mL Mouth/Throat BID  ? clonazepam  0.5 mg Per Tube TID  ? doxazosin  2 mg Per Tube Daily  ? feeding supplement (OSMOLITE 1.5 CAL)  355 mL Per Tube QID  ? feeding supplement (PROSource TF)  45 mL Per Tube BID  ?  fiber  1 packet Per Tube BID  ? free water  200 mL Per Tube Q4H  ? Gerhardt's butt cream   Topical Daily  ? glycopyrrolate  1 mg Per Tube TID  ? hydrALAZINE  50 mg Per Tube Q8H  ? isosorbide dinitrate  30 mg Per Tube TID  ? levETIRAcetam  750 mg Per Tube BID  ? mouth rinse  15 mL Mouth Rinse q12n4p  ? nutrition supplement (JUVEN)  1 packet Per Tube BID BM  ? pantoprazole sodium  40 mg Per Tube QHS  ? scopolamine  1 patch Transdermal Q72H  ? spironolactone  25 mg Per Tube Daily  ? valproic acid  750 mg Per Tube TID  ? ?Continuous Infusions: ? ? ? ? LOS: 109 days  ? ? ?Patrecia Pour, MD ?[facility] ? ?07/17/2021, 1:17 PM   ?

## 2021-07-17 NOTE — TOC Progression Note (Signed)
Transition of Care (TOC) - Progression Note  ? ? ?Patient Details  ?Name: Billy Cherry ?MRN: [id number] ?Date of Birth: 09-28-1970 ? ?Transition of Care (TOC) CM/SW Contact  ?Janae Bridgeman, RN ?Phone Number: ?07/17/2021, 9:02 AM ? ?Clinical Narrative:    ?CM spoke with Jeanmarie Plant, Admission Director with [facility] and they are continuing to recruit available nursing staff for private duty nursing at the home but have not determined permanent staff for the position at this time.  CM and MSW with DTP Team will continue to follow the patient for discharge planning to home once private duty coverage can be established. ? ? ?Expected Discharge Plan: Home w Home Health Services ?Barriers to Discharge: Other (must enter comment) (Patient's wife - requested private duty services through alternative provider for 12 hours shifts for staffing at the home.) ? ?Expected Discharge Plan and Services ?Expected Discharge Plan: Home w Home Health Services ?In-house Referral: Clinical Social Work ?Discharge Planning Services: CM Consult, Follow-up appt scheduled ?Post Acute Care Choice: Home Health ?Living arrangements for the past 2 months: Single Family Home ?                ?DME Arranged: Trach supplies, Tube feeding, Suction, Hospital bed, Oxygen ?DME Agency: [employer] ?  ?  ?  ?HH Arranged: Charity fundraiser Designer, fashion/clothing through [employer]) ?HH Agency: [employer] ?Date HH Agency Contacted: 07/04/21 ?Time HH Agency Contacted: 1000 ?Representative spoke with at [facility] Agency: Jeanmarie Plant, Director with [employer] [phone] ? ? ?Social Determinants of Health (SDOH) Interventions ?  ? ?Readmission Risk Interventions ? ?  04/14/2021  ? 11:14 AM  ?Readmission Risk Prevention Plan  ?Transportation Screening Complete  ?[place] or Home Care Consult Complete  ?Social Work Consult for Recovery Care Planning/Counseling Complete  ?Palliative Care Screening Complete  ?Medication Review Special educational needs teacher) Complete  ? ? ?

## 2021-07-18 NOTE — Progress Notes (Signed)
?PROGRESS NOTE ? ? ? ?Billy Cherry  KMM:[id number] DOB: [birth year] DOA: 03/30/2021 ?PCP: Patient, No Pcp Per (Inactive)  ? ? ?Brief Narrative:  ?51 years old male with past medical history of hypertension was brought into the hospital after being found unresponsive by his wife.  Out of hospital V-fib arrest, received CPR, 1 shock with defibrillator, 2 doses of epinephrine with ROSC, EKG noted anterolateral STEMI ?1/19 admission, left heart cath without significant CAD.  Concern for anoxic brain injury ?1/21: MRI shows areas of hypoxic-ischemic injury bilaterally.  ?1/23 severe brain damage ?1/25: PEG . Neuro exam unchanged.  Does exhibit new eyelid tremor like movements and lip/jaw tremor like vs clonus movement.  Bolused Keppra and increased maintenance dose.  Repeat EEG. CTH: concerning for progression of anoxic injury.  ?1/26: Repeat EEG unchanged and continues to show myoclonic seizures despite addition of Valproic acid. [name] for [facility] EEG ?1/28: Continues to have myoclonic sz on EEG ?2/6 New fever to 102, WBC 16K - treated with vancomycin for 7d based on cultures.  ?2/11: percutaneous tracheostomy ?2/20 Trach change to 6 cuffless ?3/13: eliquis started ?06/06/2021 CT of the chest with new PE and haziness consistent with pneumonia ?5/16: Remains awaiting adequate home health arrangements. ? ? ?Subjective : ?No overnight issues. Nonverbal, not interactive. ? ?Assessment and Plan: ?Cardiac arrest ([name])/ Postarrest anoxic encephalopathy / Postarrest respiratory failure s/p tracheostomy, S/P PEG tube placement ?Postarrest myoclonus: ?2D echocardiogram showed reduced LV function at <20%.  Now status post trach and PEG.   ?Remains obtunded with anoxic encephalopathy-on Keppra, Klonopin for breakthrough myoclonus.   ?At this time, plan is home with home health ?-Wife has reportedly completed education for trach and PEG tube ?-Enteral tube feeding trach kit and supplies to be arranged.  PCCM managing tracheostomy. ?-Seen by palliative care multiple times this admission, patient's wife continues to want full code and full scope of treatment. Will continue goals of care discussions.  ?-Plan for discharge home with extensive home health services, nursing care being arranged by TOC. ?Patient remains stable.  Afebrile. Labs are completely stable. No leukocytosis. Will recheck intermittently. ?- Continue new robinul. ? 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  Continue supportive care. ? ?Acute respiratory failure ([place]) ?Status post trach collar.  Continue supportive care ?-On 5 L, satting 95%. D/w PCCM. There is no plan for decannulation. ? ?RLL pneumonia ?Previously had completed vancomycin for MSSA and [name] in early march.  Received more than 7-day course of IV Fortaz. ?On 4/3, CT chest was ordered that indicated an area of alveolar infiltration that may represent pneumonia.  Tracheal aspirate was normal flora.   ?-s/p Unasyn x 7 days for suspicion of aspiration. ?Remains stable.  Continue to monitor. Note tachypnea which is stable.  ? ?PE/DVT (deep venous thrombosis) ([place]) ?-CTA chest 4/4 noted bilateral PE ?-Duplex ultrasound of lower extremity on 05/15/2021 showed age indeterminate deep vein thrombosis involving the left femoral vein, left proximal profunda vein, left posterior tibial veins, left popliteal vein, and left peroneal veins.  ?-Was on Eliquis initially, briefly treated with Lovenox and then back on Eliquis now ?-Continue Eliquis ?- Repeated duplex (due to fever) and clot has not propagated.   ? ?Dysphagia ?Continue PEG tube feeding.  ?-Aspiration precautions ? ?Pressure injury of skin ?Posterior right heel, right foot. Not present on admission. ? ?HTN (hypertension) ?-Continue Coreg, Cardura, isosorbide dinitrate, hydralazine. BP up, will increase hydralazine dose starting this PM. ? ?HFrEF (heart failure with reduced  ejection fraction) ([place]) ?2D  echocardiogram-LVEF < 20%,  ?-Continue coreg, imdur, spironolactone ?-Has required furosemide intermittently for volume overload.  Seems to be stable currently. Will not redose lasix at this time. ? ?Myoclonus ?Seen by neurology.  Continue current AED with Klonopin, Keppra, valproic acid (level is 71 on 5/11).  ? ?Hypernatremia ?Resolved as of 06/09/2021.  ?- Hyponatremia on labs today. Will decrease free water. ? ?Hyperkalemia ?Resolved ? ?Overall poor prognosis ?Goals of care discussions ?-Remains unresponsive with trach, PEG, pressure wounds now ?-Seen by palliative care multiple times this admission, patient's wife continues to want full code and full scope of treatment ? ? ?DVT prophylaxis: apixaban (ELIQUIS) tablet 5 mg  ?  Code Status: Full Code ?Family Communication: No family at bedside. ?Disposition Plan: Home with home health services once arrangements completed by TOC. Still pending.  ? ?Consultants:  ?PCCM ?Neurology ?Palliative care ?ID ?IR ?GI ?cards ? ?Objective: ?Vitals:  ? 07/18/21 0058 07/18/21 0310 07/18/21 0324 07/18/21 0721  ?BP: (!) 128/99  (!) 155/117   ?Pulse: 85 88 91   ?Resp: _0 ?Temp:   98.2 ?F (36.8 ?C) 98.9 ?F (37.2 ?C)  ?TempSrc:   Axillary Oral  ?SpO2: 99% 98% 100%   ?Weight:   94 kg   ?Height:      ? ? ?Intake/Output Summary (Last 24 hours) at 07/18/2021 0739 ?Last data filed at 07/18/2021 0600 ?Gross per 24 hour  ?Intake 2200 ml  ?Output 851 ml  ?Net 1349 ml  ? ?Filed Weights  ? 07/16/21 0354 07/17/21 0400 07/18/21 0324  ?Weight: 94.6 kg 95 kg 94 kg  ?Chronically ill-appearing male in no acute distress ?Coarse, nonlabored, no wheezes ?RRR, no MRG, or edema ?Soft, NT, ND, PEG site c/d/i ? ?Wounds: Please see nursing documentation ? ?Pressure Injury 05/25/21 Heel Posterior;Right Unstageable - Full thickness tissue loss in which the base of the injury is covered by slough (yellow, tan, gray, green or brown) and/or eschar (tan, brown or black) in the wound bed. (Active)  ?05/25/21  0800  ?Location: Heel  ?Location Orientation: Posterior;Right  ?Staging: Unstageable - Full thickness tissue loss in which the base of the injury is covered by slough (yellow, tan, gray, green or brown) and/or eschar (tan, brown or black) in the wound bed.  ?Wound Description (Comments):   ?Present on Admission: No  ?Dressing Type Foam - Lift dressing to assess site every shift 07/18/21 0324  ?  ? ? ?  ? ? ?  ? ?Data Reviewed: I have personally reviewed following labs and imaging studies ? ?CBC: ?Recent Labs  ?Lab 07/15/21 ?0441  ?WBC 5.8  ?HGB 11.7*  ?HCT 36.1*  ?MCV 94.3  ?PLT 223  ? ?Basic Metabolic Panel: ?Recent Labs  ?Lab 07/15/21 ?0441  ?NA 131*  ?K 4.5  ?CL 95*  ?CO2 28  ?GLUCOSE 102*  ?BUN 38*  ?CREATININE 0.68  ?CALCIUM 9.0  ? ?GFR: ?Estimated Creatinine Clearance: 127.2 mL/min (by C-G formula based on SCr of 0.68 mg/dL). ? ? ? ?Radiology Studies: ?No results found. ? ? ?Scheduled Meds: ? apixaban  5 mg Per Tube BID  ? atropine  2 drop Sublingual QID  ? carvedilol  25 mg Per Tube BID  ? chlorhexidine  15 mL Mouth/Throat BID  ? clonazepam  0.5 mg Per Tube TID  ? doxazosin  2 mg Per Tube Daily  ? feeding supplement (OSMOLITE 1.5 CAL)  355 mL Per Tube QID  ? feeding supplement (PROSource TF)  45  mL Per Tube BID  ? fiber  1 packet Per Tube BID  ? free water  200 mL Per Tube Q4H  ? Gerhardt's butt cream   Topical Daily  ? glycopyrrolate  1 mg Per Tube TID  ? hydrALAZINE  50 mg Per Tube Q8H  ? isosorbide dinitrate  30 mg Per Tube TID  ? levETIRAcetam  750 mg Per Tube BID  ? mouth rinse  15 mL Mouth Rinse q12n4p  ? nutrition supplement (JUVEN)  1 packet Per Tube BID BM  ? pantoprazole sodium  40 mg Per Tube QHS  ? scopolamine  1 patch Transdermal Q72H  ? spironolactone  25 mg Per Tube Daily  ? valproic acid  750 mg Per Tube TID  ? ?Continuous Infusions: ? ? ? ? LOS: 110 days  ? ? ?Patrecia Pour, MD ?[facility] ? ?07/18/2021, 7:39 AM   ?

## 2021-07-18 NOTE — Consult Note (Signed)
WOC Nurse wound follow up ?Patient receiving care in [facility] 3E22. ?Wound type: unstageable PI to right heel ?Measurement: 5 cm x 5 cm stable, black, thick eschar ?Wound bed: dry, stable eschar ?Drainage (amount, consistency, odor) none ?Periwound: intact ?Dressing procedure/placement/frequency: ?Continue current use of foam dressing and Prevalon heel lift boot. ?Helmut Muster, RN, MSN, CWOCN, CNS-BC, pager [phone]  ?

## 2021-07-18 NOTE — Progress Notes (Signed)
Nutrition Follow-up ? ?DOCUMENTATION CODES:  ? ?Not applicable ? ?INTERVENTION:  ?Continue tube feeding via PEG:  ?1.5 cartons of Osmolite 1.5 - QID (355 mL each bolus) ?45 mL ProSource TF - BID ?300 mL free water q4h  ?Provides 2210 kcal, 111 gm PRO, and 1086 mL free water (2879 mL total free water) daily.  ?  ?Continue Juven BID per tube, each packet provides 80 calories, 8 grams of carbohydrate, 2.5  grams of protein to promote wound healing ? ?NUTRITION DIAGNOSIS:  ? ?Inadequate oral intake related to inability to eat as evidenced by NPO status; ongoing ? ?GOAL:  ? ?Patient will meet greater than or equal to 90% of their needs; met with TF ? ?MONITOR:  ? ?Labs, Weight trends, TF tolerance, Skin, I & O's ? ?REASON FOR ASSESSMENT:  ? ?Ventilator, Consult ?Enteral/tube feeding initiation and management ? ?ASSESSMENT:  ? ?51 year old male who originally presented to [facility] on 1/19 after out-of-hospital cardiac arrest due to suspected angioedema from ACE inhibitor leading to asphyxiation. PMH of HTN, HLD. ? ?2/11 - tracheostomy placed ?2/22 - PEG placed  ? ?Pt tolerating his tube feeding. Plans to continue with current tube feeding orders. Labs and medications reviewed.  ? ?Diet Order:  NPO ?Diet Order   ? ? None  ? ?  ? ? ?EDUCATION NEEDS:  ? ?Not appropriate for education at this time ? ?Skin:  Skin Assessment: Reviewed RN Assessment ?Skin Integrity Issues:: Unstageable ?DTI: N/A ?Stage II: R foot ?Stage III: N/A ?Unstageable: R heel ?Other: MASD Buttocks ? ?Last BM:  5/14 ? ?Height:  ? ?Ht Readings from Last 1 Encounters:  ?04/18/21 $RemoveBe'5\' 10"'KzJddRydh$  (1.778 m)  ? ? ?Weight:  ? ?Wt Readings from Last 1 Encounters:  ?07/18/21 94 kg  ? ? ?Ideal Body Weight:  75.5 kg ? ?BMI:  Body mass index is 29.73 kg/m?. ? ?Estimated Nutritional Needs:  ? ?Kcal:  2200-2400 ? ?Protein:  110-125 grams ? ?Fluid:  >/= 2.2 L ? ?Corrin Parker, MS, RD, LDN ?RD pager number/after hours weekend pager number on Amion. ? ?

## 2021-07-19 NOTE — TOC Progression Note (Signed)
Transition of Care (TOC) - Progression Note  ? ? ?Patient Details  ?Name: Billy Cherry ?MRN: [id number] ?Date of Birth: 03-15-1970 ? ?Transition of Care (TOC) CM/SW Contact  ?Curlene Labrum, RN ?Phone Number: ?07/19/2021, 2:57 PM ? ?Clinical Narrative:    ?CM spoke with Feliz Beam, [facility] called and stated that he has 2 potential RN/LPN candidates that will likely be hired to provide 12 shifts at the patient's home for care.  Trixie Rude states that he plans to call and speak with the patient's wife, [name] on the phone today to discuss the planned staffing needs and will schedule to meet at the patient's home with the wife for coordination of care and plan of start date.  Trixie Rude states that he will follow up with me in the morning to discuss care plans. ? ?CM and MSW will continue to follow the patient for discharge needs for home - no pending discharge date at this time. ? ? ?Expected Discharge Plan: [place] ?Barriers to Discharge: Other (must enter comment) (Patient's wife - requested private duty services through alternative provider for 12 hours shifts for staffing at the home.) ? ?Expected Discharge Plan and Services ?Expected Discharge Plan: [place] ?In-house Referral: Clinical Social Work ?Discharge Planning Services: CM Consult, Follow-up appt scheduled ?Post Acute Care Choice: Home Health ?Living arrangements for the past 2 months: [employer] ?                ?DME Arranged: Trach supplies, Tube feeding, Suction, Hospital bed, Oxygen ?DME Agency: [employer] ?  ?  ?  ?HH Arranged: Therapist, sports Oceanographer through [employer]) ?[name] Agency: [employer] ?Date HH Agency Contacted: 07/04/21 ?Time [place]: 1000 ?Representative spoke with at [place]: Feliz Beam, Director with [employer] [phone] ? ? ?Social Determinants of Health (SDOH) Interventions ?   ? ?Readmission Risk Interventions ? ?  04/14/2021  ? 11:14 AM  ?Readmission Risk Prevention Plan  ?Transportation Screening Complete  ?[place] or Home Care Consult Complete  ?Social Work Consult for [place] Planning/Counseling Complete  ?Palliative Care Screening Complete  ?Medication Review Press photographer) Complete  ? ? ?

## 2021-07-19 NOTE — Progress Notes (Signed)
?Progress Note ? ? ?Patient: Billy Cherry UPB:[id number] DOB: Sep 11, 1970 DOA: 03/30/2021     111 ?DOS: the patient was seen and examined on 07/19/2021 ?  ?Brief hospital course: ?51 years old male with past medical history of hypertension was brought into the hospital after being found unresponsive by his wife.   ?Admitted to [facility] with out of hospital cardiac arrest ventricular fibrillation, in the setting of respiratory arrest due to suspected angioedema from ace inhibitor leading to asphyxiation.  ? ?1/19 left heart cath without significant CAD.  Concern for anoxic brain injury ?1/21: MRI shows areas of hypoxic-ischemic injury bilaterally. Fevered; cultures obtained. ?1/23 severe brain damage ?1/25: PEG . Neuro exam unchanged.  Does exhibit new eyelid tremor like movements and lip/jaw tremor like vs clonus movement.  Bolused Keppra and increased maintenance dose.  Repeat EEG. CTH: concerning for progression of anoxic injury.  ?1/26: Repeat EEG unchanged and continues to show myoclonic seizures despite addition of Valproic acid yesterday.  ?03/30/2021 Transfer to [name] from [place] for continuous EEG ?1/28: Continues to have myoclonic sz on EEG ?2/3 no meaningful neuro recovery. Diuresed ?2/6 New fever to 102, WBC 16K - treated with vancomycin for 7d based on cultures.  ?2/11: percutaneous tracheostomy ?2/13 >2/20 on trach collar. No vent.  ?2/20 Trach change to 6 cuffless ?2/28: fever ?3/13: eliquis started ?4/3: fever ?06/06/2021 CT of the chest with new PE and haziness consistent with pneumonia ?4/5-4/9: responding well to PRN lasix ?4/11 fever again after completion of abx- work up in progress ?4/13 continues to have low-grade temps cultures from 4/11 no growth to date-suspect silent/occult aspiration-heparin changed back to Eliquis 4/13 ?4/14: Work-up negative with no growth to date but red menance Tmax elevated 99.4 and respiratory rate up-x-ray did not show  ?       any pneumonia but probably silently  aspirating ? ?04/24 patient has been clinically stable, tolerating tube feedings and managing trach secretions, no increase in 02 requirements. ?Continue not interactive and poor prognosis.  ?04/25 no clinical sings of infection.  ? ? ?Now remains stable medically, awaiting safe disposition, home care/nursing arrangement, TOC/LLOS following ? ? ? ? ? ? ? ?Assessment and Plan: ?* Cardiac arrest [facility]) ?Postarrest anoxic encephalopathy ?Postarrest respiratory failure s/p TC ?S/P PEG tube placement ?Postarrest myoclonus: ?2D echocardiogram showed reduced LV function at <20%.  Now status post trach and PEG.   ?Remains obtunded with anoxic encephalopathy-on Keppra, for myoclonus and can increase Klonopin for breakthrough myoclonus.   ?At this time, plan is home with home health ?Patient's wife is trying to arrange adequate care at home ?He will need around-the-clock care.  ?Enteral tube feeding trach kit and supplies to be arranged.  ? ?Patient tolerating well tracheostomy. ?Currently with compensated systolic heart failure.  ? 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   ?Patient currently keeps his eyes open, but does not answer questions or follow commands.  ?Poor prognosis.  ?Patient not interactive, not following commands.  ? ?Acute respiratory failure ([place]) ?Status post trach collar.  Continue supportive care ?Oxygenation is 99% on 5 L per trach collar with Fi02 28%. ?Managing secretions well.  ?Patient has a cuff less trach #6, continue care per protocol.  ? ?RLL pneumonia ?Previously had completed vancomycin for MSSA and [name] in early march.  Received more than 7-day course of IV Fortaz. ?On 4/3, CT chest was ordered that indicated an area of alveolar infiltration that may represent pneumonia.  Tracheal aspirate has been sent for  culture and is significant for normal respiratory flora. Concerning for aspiration. ?-s/p Unasyn x 7 days ? ?Patient has remained afebrile.  ?Continue  aspiration precautions.  ?Currently off antibiotic therapy.  ? ?DVT (deep venous thrombosis) ([place]) ?Duplex ultrasound of lower extremity on 05/15/2021 showed age indeterminate deep vein thrombosis involving the left femoral vein, left proximal profunda vein, left posterior tibial veins, left popliteal vein, and left peroneal veins. Initially on Eliquis then changed to Lovenox ? ?Currently continue anticoagulation with apixaban.  ? ? ?Dysphagia ?Continue PEG tube feeding.  Tolerating but high risk of aspiration ?-Aspiration precautions ? ?Fever ?Patient has remained now afebrile, ?Continue to follow temperature curve.  ?Continue to hold on antibiotics.  ? ?Pulmonary embolus ([place]) ?Noted to have possible acute on chronic pulmonary embolism on CT.  Also have some evidence of possible pulmonary hypertension.  Patient was previously managed on Eliquis which is transitioned to Lovenox ? ?Currently patient is on apixaban with good toleration.  ? ?Pressure injury of skin ?Lateral penis, posterior right heel, right foot. Not present on admission. ? ?Pressure Injury 05/25/21 Heel Posterior;Right Deep Tissue Pressure Injury - Purple or maroon localized area of discolored intact skin or blood-filled blister due to damage of underlying soft tissue from pressure and/or shear. (Active)  ?05/25/21 0800  ?Location: Heel  ?Location Orientation: Posterior;Right  ?Staging: Deep Tissue Pressure Injury - Purple or maroon localized area of discolored intact skin or blood-filled blister due to damage of underlying soft tissue from pressure and/or shear.  ?Wound Description (Comments):   ?Present on Admission: No  ?Dressing Type Foam - Lift dressing to assess site every shift 06/27/21 0820  ?   ?Pressure Injury 05/25/21 Foot Right Stage 2 -  Partial thickness loss of dermis presenting as a shallow open injury with a red, pink wound bed without slough. (Active)  ?05/25/21 0800  ?Location: Foot  ?Location Orientation: Right  ?Staging: Stage 2  -  Partial thickness loss of dermis presenting as a shallow open injury with a red, pink wound bed without slough.  ?Wound Description (Comments):   ?Present on Admission: No  ?Dressing Type Foam - Lift dressing to assess site every shift 06/27/21 0820  ? ? ?HTN (hypertension) ?Continue blood pressure control with amlodipine, carvedilol, hydralazine, isosorbide and spironolactone.  ?Today blood pressure systolic down to 98, will decrease hydralazine to 25 mg and will add holding parameters.  ? ?HFrEF (heart failure with reduced ejection fraction) ([place]) ?2D echocardiogram-LVEF < 20%,  ?Patient euvolemic, continue medical therapy with carvedilol, isosorbide/ hydralazine and spironolactone ?As needed furosemide.  ? ?Myoclonus ?Seen by neurology.  Continue current AED with Klonopin, Keppra. Per neuro notes, suppressing his myoclonus will not ultimately change the prognosis  ? ?Hypernatremia-resolved as of 06/09/2021 ?Resolved with free water flushes ? ? ? ? ?  ? ?Subjective: Patient with no signs of pain or dyspnea, tolerating well tube feedings and managing trach secretions well  ? ?Physical Exam: ?Vitals:  ? 07/19/21 1020 07/19/21 1133 07/19/21 1200 07/19/21 1326  ?BP: (!) 155/116  (!) 127/99 (!) 145/115  ?Pulse: 98 (!) 102 99   ?Resp:  (!) 24 (!) 28   ?Temp:      ?TempSrc:      ?SpO2:   98%   ?Weight:      ?Height:      ? ?Neurology awake and alert ?ENT with trach in place ?Respiratory with scattered bilateral rhonchi ?Cardiovascular with S1 and S2 present and rhythmic ?Abdomen with peg tube in place  ?Data  Reviewed: ? ? ? ?Family Communication: no family at the bedside  ? ?Disposition: ?Status is: Inpatient ?Remains inpatient appropriate because: pending placement  ? Planned Discharge Destination: Home and to be determined  ? ? ? ?Author: ?Tawni Millers, MD ?07/19/2021 2:55 PM ? ?For on call review [email or web address].  ?

## 2021-07-20 NOTE — Progress Notes (Addendum)
Progress Note   Patient: Billy Cherry [id number] DOB: 1970/06/06 DOA: 03/30/2021     112 DOS: the patient was seen and examined on 07/20/2021   Brief hospital course: 51 years old male with past medical history of hypertension was brought into the hospital after being found unresponsive by his wife.   Admitted to [facility] with out of hospital cardiac arrest ventricular fibrillation, in the setting of respiratory arrest due to suspected angioedema from ace inhibitor leading to asphyxiation.   1/19 left heart cath without significant CAD.  Concern for anoxic brain injury 1/21: MRI shows areas of hypoxic-ischemic injury bilaterally. Fevered; cultures obtained. 1/23 severe brain damage 1/25: PEG . Neuro exam unchanged.  Does exhibit new eyelid tremor like movements and lip/jaw tremor like vs clonus movement.  Bolused Keppra and increased maintenance dose.  Repeat EEG. CTH: concerning for progression of anoxic injury.  1/26: Repeat EEG unchanged and continues to show myoclonic seizures despite addition of Valproic acid yesterday.  03/30/2021 Transfer to [name] from [place] for continuous EEG 1/28: Continues to have myoclonic sz on EEG 2/3 no meaningful neuro recovery. Diuresed 2/6 New fever to 102, WBC 16K - treated with vancomycin for 7d based on cultures.  2/11: percutaneous tracheostomy 2/13 >2/20 on trach collar. No vent.  2/20 Trach change to 6 cuffless 2/28: fever 3/13: eliquis started 4/3: fever 06/06/2021 CT of the chest with new PE and haziness consistent with pneumonia 4/5-4/9: responding well to PRN lasix 4/11 fever again after completion of abx- work up in progress 4/13 continues to have low-grade temps cultures from 4/11 no growth to date-suspect silent/occult aspiration-heparin changed back to Eliquis 4/13 4/14: Work-up negative with no growth to date but red menance Tmax elevated 99.4 and respiratory rate up-x-ray did not show         any pneumonia but probably silently  aspirating  04/24 patient has been clinically stable, tolerating tube feedings and managing trach secretions, no increase in 02 requirements. Continue not interactive and poor prognosis.  04/25 no clinical sings of infection.    Now remains stable medically, awaiting safe disposition, home care/nursing arrangement, TOC/LLOS following  Plan for possible dc home once home assistance has been confirmed.        Assessment and Plan: * Cardiac arrest ([place]) Acute systolic heart failure.   Echocardiogram with reduced LV systolic function [id number], with global hypokinesis. RV systolic function is preserved. No significant valvular disease.   Currently with compensated systolic heart failure.   Continue with carvedilol, hydralazine, isosorbide and spironolactone.   Patient non ambulatory with very poor prognosis.   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   Continue supportive care.   Patient currently keeps his eyes open, but does not answer questions or follow commands.  Poor prognosis.  Patient not interactive, not following commands.   Continue with clonazepam, keppra, and valproic acid.  Neuro checks per unit protocol, aspiration precautions.  Pending confirmation for home assistance before discharge.   Acute respiratory failure ([name]) Status post trach collar.  Continue supportive care Oxygenation is 99% on 5 L per trach collar with Fi02 28%. Managing secretions well.  Patient has a cuff less trach #6, continue care per protocol.   RLL pneumonia Previously had completed vancomycin for MSSA and [name] in early march.  Received more than 7-day course of IV Fortaz. On 4/3, CT chest was ordered that indicated an area of alveolar infiltration that may represent pneumonia.  Tracheal aspirate has been sent for  culture and is significant for normal respiratory flora. Concerning for aspiration. -s/p Unasyn x 7 days  Patient has remained afebrile.   Continue aspiration precautions.  Currently off antibiotic therapy.   DVT (deep venous thrombosis) ([name]) Duplex ultrasound of lower extremity on 05/15/2021 showed age indeterminate deep vein thrombosis involving the left femoral vein, left proximal profunda vein, left posterior tibial veins, left popliteal vein, and left peroneal veins. Initially on Eliquis then changed to Lovenox  Currently continue anticoagulation with apixaban.    Dysphagia Continue PEG tube feeding.  Tolerating but high risk of aspiration -Aspiration precautions  Fever Patient has remained now afebrile, Continue to follow temperature curve.  Continue to hold on antibiotics.   Pulmonary embolus ([name]) Noted to have possible acute on chronic pulmonary embolism on CT.  Also have some evidence of possible pulmonary hypertension.  Patient was previously managed on Eliquis which is transitioned to Lovenox  Currently patient is on apixaban with good toleration.   Pressure injury of skin Lateral penis, posterior right heel, right foot. Not present on admission.  Pressure Injury 05/25/21 Heel Posterior;Right Deep Tissue Pressure Injury - Purple or maroon localized area of discolored intact skin or blood-filled blister due to damage of underlying soft tissue from pressure and/or shear. (Active)  05/25/21 0800  Location: Heel  Location Orientation: Posterior;Right  Staging: Deep Tissue Pressure Injury - Purple or maroon localized area of discolored intact skin or blood-filled blister due to damage of underlying soft tissue from pressure and/or shear.  Wound Description (Comments):   Present on Admission: No  Dressing Type Foam - Lift dressing to assess site every shift 06/27/21 0820     Pressure Injury 05/25/21 Foot Right Stage 2 -  Partial thickness loss of dermis presenting as a shallow open injury with a red, pink wound bed without slough. (Active)  05/25/21 0800  Location: Foot  Location Orientation: Right   Staging: Stage 2 -  Partial thickness loss of dermis presenting as a shallow open injury with a red, pink wound bed without slough.  Wound Description (Comments):   Present on Admission: No  Dressing Type Foam - Lift dressing to assess site every shift 06/27/21 0820    HTN (hypertension) Continue blood pressure control with amlodipine, carvedilol, hydralazine, isosorbide and spironolactone.  Today blood pressure systolic down to 98, will decrease hydralazine to 25 mg and will add holding parameters.   HFrEF (heart failure with reduced ejection fraction) ([name]) 2D echocardiogram-LVEF < 20%,  Patient euvolemic, continue medical therapy with carvedilol, isosorbide/ hydralazine and spironolactone As needed furosemide.   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  Continue current AED with Klonopin, Keppra. Per neuro notes, suppressing his myoclonus will not ultimately change the prognosis         Subjective: patient with no major change in his condition, he is tolerating trach collar and tube feedings.   Physical Exam: Vitals:   07/20/21 1230 07/20/21 1331 07/20/21 1535 07/20/21 1553  BP:  (!) 147/108  (!) 130/108  Pulse: 96  91 93  Resp: (!) 25  (!) 23 20  Temp:    99 F (37.2 C)  TempSrc:    Oral  SpO2: 97%  99% 100%  Weight:      Height:       Neurology opens eyes spontaneously not following commands ENT with trach in place Cardiovascular with S1 and S2 present and rhythmic  Respiratory with no wheezing Abdomen not distended No lower extremity edema  Data Reviewed:    Family Communication: no family at the bedside   Disposition: Status is: Inpatient Remains inpatient appropriate because: pending placement   Planned Discharge Destination: Home  Author: Tawni Millers, MD 07/20/2021 4:12 PM  For on call review [email or web address].

## 2021-07-20 NOTE — TOC Progression Note (Signed)
Transition of Care [facility]) - Progression Note    Patient Details  Name: Billy Cherry MRN: [id number] Date of Birth: 18-Aug-1970  [facility] CM/SW Contact  Janae Bridgeman, RN Phone Number: 07/20/2021, 11:54 AM  Clinical Narrative:    CM called and spoke with Jeanmarie Plant, Director with [facility] on the phone to coordinate a home visit with the patient's wife at the housing - pending home visit on Monday, 07/24/2021 to discuss providing 2 RN/LPN staff members to cover the patient's care at the home while the patient's wife is at work.  I called and spoke with the patient's wife on the phone to establish that the home visit would be scheduled next week and the patient would be likely discharged to the home once the staffing was confirmed.  The patient's wife is aware of this plan.  The patient's wife states that she has been in touch with [employer] and needed air mattress to be supplied at the home.  At this time - no discharge date has been established - pending confirmation of private duty staffing.  CM and MSW with DTP Team will continue to follow the patient for plans for discharge to home with private duty nursing - once confirmed at an upcoming date.   Expected Discharge Plan: Home w Home Health Services Barriers to Discharge: Other (must enter comment) (Patient's wife - requested private duty services through alternative provider for 12 hours shifts for staffing at the home.)  Expected Discharge Plan and Services Expected Discharge Plan: Home w Home Health Services In-house Referral: Clinical Social Work Discharge Planning Services: CM Consult, Follow-up appt scheduled Post Acute Care Choice: Home Health Living arrangements for the past 2 months: Single Family Home                 DME Arranged: Trach supplies, Tube feeding, Suction, Hospital bed, Oxygen DME Agency: [employer]       HH Arranged: RN (Private Duty Nursing through [employer] versus [place]) HH  Agency: [employer] Services Date HH Agency Contacted: 07/04/21 Time HH Agency Contacted: 1000 Representative spoke with at [facility] Agency: Jeanmarie Plant, Director with [employer] [phone]   Social Determinants of Health (SDOH) Interventions    Readmission Risk Interventions    04/14/2021   11:14 AM  Readmission Risk Prevention Plan  Transportation Screening Complete  HRI or Home Care Consult Complete  Social Work Consult for Recovery Care Planning/Counseling Complete  Palliative Care Screening Complete  Medication Review [employer]) Complete

## 2021-07-21 NOTE — Progress Notes (Signed)
Progress Note   Patient: Billy Cherry [id number] DOB: 31-Jul-1970 DOA: 03/30/2021     113 DOS: the patient was seen and examined on 07/21/2021   Brief hospital course: 51 years old male with past medical history of hypertension was brought into the hospital after being found unresponsive by his wife.   Admitted to [facility] with out of hospital cardiac arrest ventricular fibrillation, in the setting of respiratory arrest due to suspected angioedema from ace inhibitor leading to asphyxiation.   1/19 left heart cath without significant CAD.  Concern for anoxic brain injury 1/21: MRI shows areas of hypoxic-ischemic injury bilaterally. Fevered; cultures obtained. 1/23 severe brain damage 1/25: PEG . Neuro exam unchanged.  Does exhibit new eyelid tremor like movements and lip/jaw tremor like vs clonus movement.  Bolused Keppra and increased maintenance dose.  Repeat EEG. CTH: concerning for progression of anoxic injury.  1/26: Repeat EEG unchanged and continues to show myoclonic seizures despite addition of Valproic acid yesterday.  03/30/2021 Transfer to [name] from [place] for continuous EEG 1/28: Continues to have myoclonic sz on EEG 2/3 no meaningful neuro recovery. Diuresed 2/6 New fever to 102, WBC 16K - treated with vancomycin for 7d based on cultures.  2/11: percutaneous tracheostomy 2/13 >2/20 on trach collar. No vent.  2/20 Trach change to 6 cuffless 2/28: fever 3/13: eliquis started 4/3: fever 06/06/2021 CT of the chest with new PE and haziness consistent with pneumonia 4/5-4/9: responding well to PRN lasix 4/11 fever again after completion of abx- work up in progress 4/13 continues to have low-grade temps cultures from 4/11 no growth to date-suspect silent/occult aspiration-heparin changed back to Eliquis 4/13 4/14: Work-up negative with no growth to date but red menance Tmax elevated 99.4 and respiratory rate up-x-ray did not show         any pneumonia but probably silently  aspirating  04/24 patient has been clinically stable, tolerating tube feedings and managing trach secretions, no increase in 02 requirements. Continue not interactive and poor prognosis.  04/25 no clinical sings of infection.    Now remains stable medically, awaiting safe disposition, home care/nursing arrangement, TOC/LLOS following  Plan for possible dc home once home assistance has been confirmed.        Assessment and Plan: * Cardiac arrest ([place]) Acute systolic heart failure.   Echocardiogram with reduced LV systolic function 99991111, with global hypokinesis. RV systolic function is preserved. No significant valvular disease.   Currently with compensated systolic heart failure.   Continue with carvedilol, hydralazine, isosorbide and spironolactone.   Patient non ambulatory with very poor prognosis.   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   Continue supportive care.   Patient currently keeps his eyes open, but does not answer questions or follow commands.  Poor prognosis.  Patient not interactive, not following commands.   Continue with clonazepam, keppra, and valproic acid.  Neuro checks per unit protocol, aspiration precautions.  Pending confirmation for home assistance before discharge.   Acute respiratory failure ([name]) Status post trach collar.  Continue supportive care Oxygenation is 99% on 5 L per trach collar with Fi02 28%. Managing secretions well.  Patient has a cuff less trach #6, continue care per protocol.   RLL pneumonia Previously had completed vancomycin for MSSA and [name] in early march.  Received more than 7-day course of IV Fortaz. On 4/3, CT chest was ordered that indicated an area of alveolar infiltration that may represent pneumonia.  Tracheal aspirate has been sent for  culture and is significant for normal respiratory flora. Concerning for aspiration. -s/p Unasyn x 7 days  Patient has remained afebrile.   Continue aspiration precautions.  Currently off antibiotic therapy.   DVT (deep venous thrombosis) ([name]) Duplex ultrasound of lower extremity on 05/15/2021 showed age indeterminate deep vein thrombosis involving the left femoral vein, left proximal profunda vein, left posterior tibial veins, left popliteal vein, and left peroneal veins. Initially on Eliquis then changed to Lovenox  Currently continue anticoagulation with apixaban.    Dysphagia Continue PEG tube feeding.  Tolerating but high risk of aspiration -Aspiration precautions  Fever Patient has remained now afebrile, Continue to follow temperature curve.  Continue to hold on antibiotics.   Pulmonary embolus ([name]) Noted to have possible acute on chronic pulmonary embolism on CT.  Also have some evidence of possible pulmonary hypertension.  Patient was previously managed on Eliquis which is transitioned to Lovenox  Currently patient is on apixaban with good toleration.   Pressure injury of skin Lateral penis, posterior right heel, right foot. Not present on admission.  Pressure Injury 05/25/21 Heel Posterior;Right Deep Tissue Pressure Injury - Purple or maroon localized area of discolored intact skin or blood-filled blister due to damage of underlying soft tissue from pressure and/or shear. (Active)  05/25/21 0800  Location: Heel  Location Orientation: Posterior;Right  Staging: Deep Tissue Pressure Injury - Purple or maroon localized area of discolored intact skin or blood-filled blister due to damage of underlying soft tissue from pressure and/or shear.  Wound Description (Comments):   Present on Admission: No  Dressing Type Foam - Lift dressing to assess site every shift 06/27/21 0820     Pressure Injury 05/25/21 Foot Right Stage 2 -  Partial thickness loss of dermis presenting as a shallow open injury with a red, pink wound bed without slough. (Active)  05/25/21 0800  Location: Foot  Location Orientation: Right   Staging: Stage 2 -  Partial thickness loss of dermis presenting as a shallow open injury with a red, pink wound bed without slough.  Wound Description (Comments):   Present on Admission: No  Dressing Type Foam - Lift dressing to assess site every shift 06/27/21 0820    HTN (hypertension) Continue blood pressure control with amlodipine, carvedilol, hydralazine, isosorbide and spironolactone.  Today blood pressure systolic down to 98, will decrease hydralazine to 25 mg and will add holding parameters.   HFrEF (heart failure with reduced ejection fraction) ([name]) 2D echocardiogram-LVEF < 20%,  Patient euvolemic, continue medical therapy with carvedilol, isosorbide/ hydralazine and spironolactone As needed furosemide.   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  Continue current AED with Klonopin, Keppra. Per neuro notes, suppressing his myoclonus will not ultimately change the prognosis         Subjective: Patient with no new events, continue to tolerate tube feedings and managing trach secretions well.   Physical Exam: Vitals:   07/21/21 0822 07/21/21 0904 07/21/21 1129 07/21/21 1232  BP:  123/88 (!) 134/99   Pulse: 99 91 93 96  Resp: (!) 26  20 (!) 24  Temp:   99.7 F (37.6 C)   TempSrc:   Oral   SpO2: 97%  97% 98%  Weight:      Height:       Neurology awake and alert ENT with no pallor Cardiovascular with S1 and S2 present and rhythmic Respiratory with no rales, scattered proximal rhonchi Abdomen not distended No lower extremity edema.  Data Reviewed:  Family Communication: no family at the bedside   Disposition: Status is: Inpatient Remains inpatient appropriate because: pending placement   Planned Discharge Destination: Home  Author: Tawni Millers, MD 07/21/2021 1:48 PM  For on call review [email or web address].

## 2021-07-21 NOTE — Progress Notes (Signed)
Patient seen today by trach team for consult.  No education is needed at this time.  All necessary equipment is at beside.   Will continue to follow for progression.  

## 2021-07-22 NOTE — Progress Notes (Signed)
Progress Note   Patient: Billy Cherry [id number] DOB: September 08, 1970 DOA: 03/30/2021     114 DOS: the patient was seen and examined on 07/22/2021   Brief hospital course: 51 years old male with past medical history of hypertension was brought into the hospital after being found unresponsive by his wife.   Admitted to [facility] with out of hospital cardiac arrest ventricular fibrillation, in the setting of respiratory arrest due to suspected angioedema from ace inhibitor leading to asphyxiation.   1/19 left heart cath without significant CAD.  Concern for anoxic brain injury 1/21: MRI shows areas of hypoxic-ischemic injury bilaterally. Fevered; cultures obtained. 1/23 severe brain damage 1/25: PEG . Neuro exam unchanged.  Does exhibit new eyelid tremor like movements and lip/jaw tremor like vs clonus movement.  Bolused Keppra and increased maintenance dose.  Repeat EEG. CTH: concerning for progression of anoxic injury.  1/26: Repeat EEG unchanged and continues to show myoclonic seizures despite addition of Valproic acid yesterday.  03/30/2021 Transfer to [facility] from [place] for continuous EEG 1/28: Continues to have myoclonic sz on EEG 2/3 no meaningful neuro recovery. Diuresed 2/6 New fever to 102, WBC 16K - treated with vancomycin for 7d based on cultures.  2/11: percutaneous tracheostomy 2/13 >2/20 on trach collar. No vent.  2/20 Trach change to 6 cuffless 2/28: fever 3/13: eliquis started 4/3: fever 06/06/2021 CT of the chest with new PE and haziness consistent with pneumonia 4/5-4/9: responding well to PRN lasix 4/11 fever again after completion of abx- work up in progress 4/13 continues to have low-grade temps cultures from 4/11 no growth to date-suspect silent/occult aspiration-heparin changed back to Eliquis 4/13 4/14: Work-up negative with no growth to date but red menance Tmax elevated 99.4 and respiratory rate up-x-ray did not show         any pneumonia but probably silently  aspirating  04/24 patient has been clinically stable, tolerating tube feedings and managing trach secretions, no increase in 02 requirements. Continue not interactive and poor prognosis.  04/25 no clinical sings of infection.    Now remains stable medically, awaiting safe disposition, home care/nursing arrangement, TOC/LLOS following  Plan for possible dc home once home assistance has been confirmed.        Assessment and Plan: * Cardiac arrest ([name]) Acute systolic heart failure.   Echocardiogram with reduced LV systolic function <20%, with global hypokinesis. RV systolic function is preserved. No significant valvular disease.   Currently with compensated systolic heart failure.   Continue with carvedilol, hydralazine, isosorbide and spironolactone.   Patient non ambulatory with very poor prognosis.   Anoxic encephalopathy ([name]) Patient sp peg and trach.   Continue supportive care.   Patient currently keeps his eyes open, but does not answer questions or follow commands.  Poor prognosis.  Patient not interactive, not following commands.   Continue with clonazepam, keppra, and valproic acid.  Neuro checks per unit protocol, aspiration precautions.  Pending confirmation for home assistance before discharge.   Acute respiratory failure ([name]) Status post trach collar.  Continue supportive care Oxygenation is 99% on 5 L per trach collar with Fi02 28%. Managing secretions well.  Patient has a cuff less trach #6, continue care per protocol.   RLL pneumonia Previously had completed vancomycin for MSSA and [name] in early march.  Received more than 7-day course of IV Fortaz. On 4/3, CT chest was ordered that indicated an area of alveolar infiltration that may represent pneumonia.  Tracheal aspirate has been sent for  culture and is significant for normal respiratory flora. Concerning for aspiration. -s/p Unasyn x 7 days  Patient has remained afebrile.   Continue aspiration precautions.  Currently off antibiotic therapy.   DVT (deep venous thrombosis) ([name]) Duplex ultrasound of lower extremity on 05/15/2021 showed age indeterminate deep vein thrombosis involving the left femoral vein, left proximal profunda vein, left posterior tibial veins, left popliteal vein, and left peroneal veins. Initially on Eliquis then changed to Lovenox  Currently continue anticoagulation with apixaban.    Dysphagia Continue PEG tube feeding.  Tolerating but high risk of aspiration -Aspiration precautions  Fever Patient has remained now afebrile, Continue to follow temperature curve.  Continue to hold on antibiotics.   Pulmonary embolus ([name]) Noted to have possible acute on chronic pulmonary embolism on CT.  Also have some evidence of possible pulmonary hypertension.  Patient was previously managed on Eliquis which is transitioned to Lovenox  Currently patient is on apixaban with good toleration.   Pressure injury of skin Lateral penis, posterior right heel, right foot. Not present on admission.  Pressure Injury 05/25/21 Heel Posterior;Right Deep Tissue Pressure Injury - Purple or maroon localized area of discolored intact skin or blood-filled blister due to damage of underlying soft tissue from pressure and/or shear. (Active)  05/25/21 0800  Location: Heel  Location Orientation: Posterior;Right  Staging: Deep Tissue Pressure Injury - Purple or maroon localized area of discolored intact skin or blood-filled blister due to damage of underlying soft tissue from pressure and/or shear.  Wound Description (Comments):   Present on Admission: No  Dressing Type Foam - Lift dressing to assess site every shift 06/27/21 0820     Pressure Injury 05/25/21 Foot Right Stage 2 -  Partial thickness loss of dermis presenting as a shallow open injury with a red, pink wound bed without slough. (Active)  05/25/21 0800  Location: Foot  Location Orientation: Right   Staging: Stage 2 -  Partial thickness loss of dermis presenting as a shallow open injury with a red, pink wound bed without slough.  Wound Description (Comments):   Present on Admission: No  Dressing Type Foam - Lift dressing to assess site every shift 06/27/21 0820    HTN (hypertension) Continue blood pressure control with amlodipine, carvedilol, hydralazine, isosorbide and spironolactone.  Today blood pressure systolic down to 98, will decrease hydralazine to 25 mg and will add holding parameters.   HFrEF (heart failure with reduced ejection fraction) ([place]) 2D echocardiogram-LVEF < 20%,  Patient euvolemic, continue medical therapy with carvedilol, isosorbide/ hydralazine and spironolactone As needed furosemide.   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  Continue current AED with Klonopin, Keppra. Per neuro notes, suppressing his myoclonus will not ultimately change the prognosis         Subjective: Patient has been stable, no acute events, pending transfer to home when assistance for his family has been arranged.   Physical Exam: Vitals:   07/22/21 0800 07/22/21 0859 07/22/21 1102 07/22/21 1224  BP: (!) 128/100  118/87   Pulse: 95  95 98  Resp: 17  (!) 22 20  Temp:      TempSrc:      SpO2: 97% 97% 95% 95%  Weight:      Height:       Neurology eyes closed, not following commands ENT with trach in place Cardiovascular with S1 and S2 present Respiratory anterior auscultation with no wheezing or rhonchi Abdomen not distended  Data Reviewed:    Family  Communication: no family at the bedside   Disposition: Status is: Inpatient Remains inpatient appropriate because: heart failure   Planned Discharge Destination: Home      Author: Tawni Millers, MD 07/22/2021 2:25 PM  For on call review [email or web address].

## 2021-07-23 NOTE — Progress Notes (Signed)
Progress Note   Patient: Billy Cherry [id number] DOB: 1970/04/12 DOA: 03/30/2021     115 DOS: the patient was seen and examined on 07/23/2021   Brief hospital course: 51 years old male with past medical history of hypertension was brought into the hospital after being found unresponsive by his wife.   Admitted to [facility] with out of hospital cardiac arrest ventricular fibrillation, in the setting of respiratory arrest due to suspected angioedema from ace inhibitor leading to asphyxiation.   1/19 left heart cath without significant CAD.  Concern for anoxic brain injury 1/21: MRI shows areas of hypoxic-ischemic injury bilaterally. Fevered; cultures obtained. 1/23 severe brain damage 1/25: PEG . Neuro exam unchanged.  Does exhibit new eyelid tremor like movements and lip/jaw tremor like vs clonus movement.  Bolused Keppra and increased maintenance dose.  Repeat EEG. CTH: concerning for progression of anoxic injury.  1/26: Repeat EEG unchanged and continues to show myoclonic seizures despite addition of Valproic acid yesterday.  03/30/2021 Transfer to [name] from [place] for continuous EEG 1/28: Continues to have myoclonic sz on EEG 2/3 no meaningful neuro recovery. Diuresed 2/6 New fever to 102, WBC 16K - treated with vancomycin for 7d based on cultures.  2/11: percutaneous tracheostomy 2/13 >2/20 on trach collar. No vent.  2/20 Trach change to 6 cuffless 2/28: fever 3/13: eliquis started 4/3: fever 06/06/2021 CT of the chest with new PE and haziness consistent with pneumonia 4/5-4/9: responding well to PRN lasix 4/11 fever again after completion of abx- work up in progress 4/13 continues to have low-grade temps cultures from 4/11 no growth to date-suspect silent/occult aspiration-heparin changed back to Eliquis 4/13 4/14: Work-up negative with no growth to date but red menance Tmax elevated 99.4 and respiratory rate up-x-ray did not show         any pneumonia but probably silently  aspirating  04/24 patient has been clinically stable, tolerating tube feedings and managing trach secretions, no increase in 02 requirements. Continue not interactive and poor prognosis.  04/25 no clinical sings of infection.    Now remains stable medically, awaiting safe disposition, home care/nursing arrangement, TOC/LLOS following  Plan for possible dc home once home assistance has been confirmed.        Assessment and Plan: * Cardiac arrest ([place]) Acute systolic heart failure.   Echocardiogram with reduced LV systolic function 99991111, with global hypokinesis. RV systolic function is preserved. No significant valvular disease.   Currently with compensated systolic heart failure.   Continue with carvedilol, hydralazine, isosorbide and spironolactone.   Patient non ambulatory with very poor prognosis.   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   Continue supportive care.   Patient currently keeps his eyes open, but does not answer questions or follow commands.  Poor prognosis.  Patient not interactive, not following commands.   Continue with clonazepam, keppra, and valproic acid.  Neuro checks per unit protocol, aspiration precautions.  Pending confirmation for home assistance before discharge.   Acute respiratory failure ([name]) Status post trach collar.  Continue supportive care Oxygenation is 99% on 5 L per trach collar with Fi02 28%. Managing secretions well.  Patient has a cuff less trach #6, continue care per protocol.   RLL pneumonia Previously had completed vancomycin for MSSA and [name] in early march.  Received more than 7-day course of IV Fortaz. On 4/3, CT chest was ordered that indicated an area of alveolar infiltration that may represent pneumonia.  Tracheal aspirate has been sent for  culture and is significant for normal respiratory flora. Concerning for aspiration. -s/p Unasyn x 7 days  Patient has remained afebrile.   Continue aspiration precautions.  Currently off antibiotic therapy.   DVT (deep venous thrombosis) ([name]) Duplex ultrasound of lower extremity on 05/15/2021 showed age indeterminate deep vein thrombosis involving the left femoral vein, left proximal profunda vein, left posterior tibial veins, left popliteal vein, and left peroneal veins. Initially on Eliquis then changed to Lovenox  Currently continue anticoagulation with apixaban.    Dysphagia Continue PEG tube feeding.  Tolerating but high risk of aspiration -Aspiration precautions  Fever Patient has remained now afebrile, Continue to follow temperature curve.  Continue to hold on antibiotics.   Pulmonary embolus ([name]) Noted to have possible acute on chronic pulmonary embolism on CT.  Also have some evidence of possible pulmonary hypertension.  Patient was previously managed on Eliquis which is transitioned to Lovenox  Currently patient is on apixaban with good toleration.   Pressure injury of skin Lateral penis, posterior right heel, right foot. Not present on admission.  Pressure Injury 05/25/21 Heel Posterior;Right Deep Tissue Pressure Injury - Purple or maroon localized area of discolored intact skin or blood-filled blister due to damage of underlying soft tissue from pressure and/or shear. (Active)  05/25/21 0800  Location: Heel  Location Orientation: Posterior;Right  Staging: Deep Tissue Pressure Injury - Purple or maroon localized area of discolored intact skin or blood-filled blister due to damage of underlying soft tissue from pressure and/or shear.  Wound Description (Comments):   Present on Admission: No  Dressing Type Foam - Lift dressing to assess site every shift 06/27/21 0820     Pressure Injury 05/25/21 Foot Right Stage 2 -  Partial thickness loss of dermis presenting as a shallow open injury with a red, pink wound bed without slough. (Active)  05/25/21 0800  Location: Foot  Location Orientation: Right   Staging: Stage 2 -  Partial thickness loss of dermis presenting as a shallow open injury with a red, pink wound bed without slough.  Wound Description (Comments):   Present on Admission: No  Dressing Type Foam - Lift dressing to assess site every shift 06/27/21 0820    HTN (hypertension) Continue blood pressure control with amlodipine, carvedilol, hydralazine, isosorbide and spironolactone.  Today blood pressure systolic down to 98, will decrease hydralazine to 25 mg and will add holding parameters.   HFrEF (heart failure with reduced ejection fraction) ([place]) 2D echocardiogram-LVEF < 20%,  Patient euvolemic, continue medical therapy with carvedilol, isosorbide/ hydralazine and spironolactone As needed furosemide.   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  Continue current AED with Klonopin, Keppra. Per neuro notes, suppressing his myoclonus will not ultimately change the prognosis         Subjective: patient continue medically stable, no new events, he has been waiting for his discharge home.   Physical Exam: Vitals:   07/22/21 2340 07/23/21 0326 07/23/21 0500 07/23/21 0733  BP: 116/89 (!) 133/94  (!) 123/94  Pulse: (!) 104 98  99  Resp: (!) 29 (!) 25  (!) 22  Temp: 99.4 F (37.4 C)   99.6 F (37.6 C)  TempSrc: Oral   Oral  SpO2: 95% 100%  100%  Weight:   94 kg   Height:       Neurology not in distress, opens eyes spontaneously but not follows commands ENT with no pallor Cardiovascular with S1 and S2 present and rhythmic Respiratory with scattered rhonchi Abdomen  not distended No lower extremity edema (compression boots in place) . Data Reviewed:    Family Communication: no family at the bedside   Disposition: Status is: Inpatient Remains inpatient appropriate because: pending placement   Planned Discharge Destination: Home     Author: Tawni Millers, MD 07/23/2021 10:19 AM  For on call review  [email or web address].

## 2021-07-24 DIAGNOSIS — Z7901 Long term (current) use of anticoagulants: Secondary | ICD-10-CM

## 2021-07-24 LAB — BASIC METABOLIC PANEL
Anion gap: 7 (ref 5–15)
BUN: 34 mg/dL — ABNORMAL HIGH (ref 6–20)
CO2: 29 mmol/L (ref 22–32)
Calcium: 9 mg/dL (ref 8.9–10.3)
Chloride: 100 mmol/L (ref 98–111)
Creatinine, Ser: 0.73 mg/dL (ref 0.61–1.24)
GFR, Estimated: >60 mL/min (ref >60)
Glucose, Bld: 106 mg/dL — ABNORMAL HIGH (ref 70–99)
Potassium: 4.9 mmol/L (ref 3.5–5.1)
Sodium: 136 mmol/L (ref 135–145)

## 2021-07-24 NOTE — TOC Progression Note (Signed)
Transition of Care [facility]) - Progression Note    Patient Details  Name: Billy Cherry MRN: [id number] Date of Birth: [birth year]  Transition of Care [facility]) CM/SW Contact  Janae Bridgeman, RN Phone Number: 07/24/2021, 9:10 AM  Clinical Narrative:    CM called and spoke with Jeanmarie Plant, director with [facility] [phone] and they are planning to meet with the patient's wife this Wednesday, 07/26/2021  at 10 am for home assessment and discussion involving private duty nursing availability at the home.  Updated clinicals including face sheet, progress notes, Dietitian notes, and MAR were sent for review at [facility] office - fax # [phone].   Expected Discharge Plan: Home w Home Health Services Barriers to Discharge: Other (must enter comment) (Patient's wife - requested private duty services through alternative provider for 12 hours shifts for staffing at the home.)  Expected Discharge Plan and Services Expected Discharge Plan: Home w Home Health Services In-house Referral: Clinical Social Work Discharge Planning Services: CM Consult, Follow-up appt scheduled Post Acute Care Choice: Home Health Living arrangements for the past 2 months: Single Family Home                 DME Arranged: Trach supplies, Tube feeding, Suction, Hospital bed, Oxygen DME Agency: [employer]       HH Arranged: RN (Private Duty Nursing through [employer] versus [place]) HH Agency: [employer] Services Date HH Agency Contacted: 07/04/21 Time HH Agency Contacted: 1000 Representative spoke with at [facility] Agency: Jeanmarie Plant, Director with [employer] [phone]   Social Determinants of Health (SDOH) Interventions    Readmission Risk Interventions    04/14/2021   11:14 AM  Readmission Risk Prevention Plan  Transportation Screening Complete  HRI or Home Care Consult Complete  Social Work Consult for Recovery Care Planning/Counseling Complete  Palliative Care  Screening Complete  Medication Review [facility]) Complete

## 2021-07-24 NOTE — Consult Note (Signed)
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   Measurement:5 cm x 5 cm stable black dry eschar.   Wound bed: dry black eschar Drainage (amount, consistency, odor) none Periwound: skin is dry to feet.  Left heel is intact  Both heels are correctly positioned in Prevalon boots for offloading.  Dressing procedure/placement/frequency: continue currently plan of care, [facility] team will assess every 7-10 days.   Domenic Moras MSN, RN, FNP-BC CWON Wound, Ostomy, Continence Nurse Pager [phone]

## 2021-07-24 NOTE — Progress Notes (Signed)
NAME:  Billy Cherry, MRN:  [id number], DOB:  01/23/1971, LOS: 116 ADMISSION DATE:  03/30/2021, CONSULTATION DATE:  1/19 REFERRING MD:  Evette Georges, CHIEF COMPLAINT:  Found down, cardiac arrest   Brief Pt Description / Synopsis:  51 year old male with out-of-hospital V. fib cardiac arrest in setting of respiratory arrest due to suspected angioedema from ACE inhibitor leading to asphyxiation.  Concern on his 12 lead EKG for anterolateral ST elevation so he was taken to the cath lab where his study showed no significant coronary artery disease and an LVEF < 20%  Now with anoxic brain injury.  Pertinent  Medical History  Hypertension  Significant Hospital Events: Including procedures, antibiotic start and stop dates in addition to other pertinent events   1/19 admission, left heart cath without significant CAD.  Concern for anoxic brain injury 1/20: MRI pending, Neuro following 1/21: MRI shows areas of hypoxic-ischemic injury bilaterally. Fevered; cultures obtained. 1/23 severe brain damage 1/24 evidence of severe brain damage, remains critically ill 1/25: Plan for PEG today. Neuro exam unchanged.  Does exhibit new eyelid tremor like movements and lip/jaw tremor like vs clonus movement.  Bolused Keppra and increased maintenance dose.  Repeat EEG. CTH: concerning for progression of anoxic injury.  1/26: Repeat EEG unchanged and continues to show myoclonic seizures despite addition of Valproic acid yesterday. transfer to [facility] for continuous EEG 1/28: Continues to have myoclonic sz on EEG 1/29 No acute issues overnight 2/3 no meaningful neuro recovery. Diuresed 2/5 unchanged 2/6 New fever to 102, WBC 16K - treated with vancomycin for 7d based on cultures.  2/11: percutaneous tracheostomy 2/13 >2/20 on trach collar. No vent.  2/20 Trach change to 6 cuffless 2/27 status unchanged 3/13 unchanged 3/20 due for trach exchange given continue secretions (although improved) will stick to 6 cuffless  for exchange  06/05/2021 Continued thick  secretions, scant blood tinged 4/3/am 06/06/2021 CT of the chest with new PE and haziness consistent with pneumonia  Interim History / Subjective:  No changes, unable to answer questions. May be discharged home soon.  Objective   Blood pressure (!) 146/105, pulse 93, temperature 99.9 F (37.7 C), temperature source Oral, resp. rate (!) 22, height 5\' 10"  (1.778 m), weight 94 kg, SpO2 99 %.    FiO2 (%):  [28 %] 28 %   Intake/Output Summary (Last 24 hours) at 07/24/2021 1106 Last data filed at 07/23/2021 2326 Gross per 24 hour  Intake 6800 ml  Output 950 ml  Net 5850 ml     Filed Weights   07/20/21 0031 07/22/21 0458 07/23/21 0500  Weight: 95 kg 94 kg 94 kg   Examination:  General: chronically ill appearing man lying in bed in NAD HEENT: [place]/AT, eyes anicteric Neck: trach in place, shiley #6 uncuffed  Resp: mild rhonchi on R, CTA on left, no accessory muscle use Cardiac: S1S2, RRR Neuro: no significant eye opening, no response to verbal stimulation or during exam Abd: PEG  without erythema  Labs reviewed. Mild azotemia and anemia persist. No recent imaging or cultures.  Ancillary tests personally reviewed:     Assessment & Plan:  OOH Vfib arrest Post arrest anoxic brain injury, severe Post arrest myoclonus improved with klonopin Post arrest respiratory failure with prolonged mechanical ventilation s/p tracheostomy. Remains tracheostomy dependent due to inability to manage secretions.  Post arrest cardiomyopathy Post arrest AKI, resolved HTN- stable on current regimen PE- on eliquis  Plan:  -Con't TC -Routine trach care -Con't shiley # 6 cuffed indefinitely unless able  to manage his own secretions -Con't eliquis indefinitely as long as bleeding risk remains low; is very high risk for recurrent VTE. -overall poor neuro prognosis -PCCM will continue to see weekly, next 5/30. Please call in the interim with questions.   Steffanie Dunn, DO 07/24/21 11:37 AM [facility]

## 2021-07-24 NOTE — Progress Notes (Signed)
Progress Note   Patient: Billy Cherry [id number] DOB: 02-Jan-1971 DOA: 03/30/2021     116 DOS: the patient was seen and examined on 07/24/2021   Brief hospital course: 51 years old male with past medical history of hypertension was brought into the hospital after being found unresponsive by his wife.   Admitted to [facility] with out of hospital cardiac arrest ventricular fibrillation, in the setting of respiratory arrest due to suspected angioedema from ace inhibitor leading to asphyxiation.   1/19 left heart cath without significant CAD.  Concern for anoxic brain injury 1/21: MRI shows areas of hypoxic-ischemic injury bilaterally. Fevered; cultures obtained. 1/23 severe brain damage 1/25: PEG . Neuro exam unchanged.  Does exhibit new eyelid tremor like movements and lip/jaw tremor like vs clonus movement.  Bolused Keppra and increased maintenance dose.  Repeat EEG. CTH: concerning for progression of anoxic injury.  1/26: Repeat EEG unchanged and continues to show myoclonic seizures despite addition of Valproic acid yesterday.  03/30/2021 Transfer to [name] from [place] for continuous EEG 1/28: Continues to have myoclonic sz on EEG 2/3 no meaningful neuro recovery. Diuresed 2/6 New fever to 102, WBC 16K - treated with vancomycin for 7d based on cultures.  2/11: percutaneous tracheostomy 2/13 >2/20 on trach collar. No vent.  2/20 Trach change to 6 cuffless 2/28: fever 3/13: eliquis started 4/3: fever 06/06/2021 CT of the chest with new PE and haziness consistent with pneumonia 4/5-4/9: responding well to PRN lasix 4/11 fever again after completion of abx- work up in progress 4/13 continues to have low-grade temps cultures from 4/11 no growth to date-suspect silent/occult aspiration-heparin changed back to Eliquis 4/13 4/14: Work-up negative with no growth to date but red menance Tmax elevated 99.4 and respiratory rate up-x-ray did not show         any pneumonia but probably silently  aspirating  04/24 patient has been clinically stable, tolerating tube feedings and managing trach secretions, no increase in 02 requirements. Continue not interactive and poor prognosis.  04/25 no clinical sings of infection.    Now remains stable medically, awaiting safe disposition, home care/nursing arrangement, TOC/LLOS following  Plan for possible dc home once home assistance has been confirmed.        Assessment and Plan: * Cardiac arrest ([place]) Acute systolic heart failure.   Echocardiogram with reduced LV systolic function [id number], with global hypokinesis. RV systolic function is preserved. No significant valvular disease.   Currently with compensated systolic heart failure.   Continue with carvedilol, hydralazine, isosorbide and spironolactone.   Patient non ambulatory with very poor prognosis.   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  #6 Shiley cuffed.   Continue supportive care.   Patient currently keeps his eyes open, but does not answer questions or follow commands.  Poor prognosis.  Patient not interactive, not following commands.   Continue with clonazepam, keppra, and valproic acid.  Neuro checks per unit protocol, aspiration precautions.  Pending confirmation for home assistance before discharge.   Acute respiratory failure ([name]) Status post trach collar.  Continue supportive care Oxygenation is 99% on 5 L per trach collar with Fi02 28%. Managing secretions well.  Patient has a cuff less trach #6, continue care per protocol.   RLL pneumonia Previously had completed vancomycin for MSSA and [name] in early march.  Received more than 7-day course of IV Fortaz. On 4/3, CT chest was ordered that indicated an area of alveolar infiltration that may represent pneumonia.  Tracheal aspirate  has been sent for culture and is significant for normal respiratory flora. Concerning for aspiration. -s/p Unasyn x 7 days  Patient has  remained afebrile.  Continue aspiration precautions.  Currently off antibiotic therapy.   DVT (deep venous thrombosis) ([name]) Duplex ultrasound of lower extremity on 05/15/2021 showed age indeterminate deep vein thrombosis involving the left femoral vein, left proximal profunda vein, left posterior tibial veins, left popliteal vein, and left peroneal veins. Initially on Eliquis then changed to Lovenox  Currently continue anticoagulation with apixaban.    Dysphagia Continue PEG tube feeding.  Tolerating but high risk of aspiration -Aspiration precautions  Fever Patient has remained now afebrile, Continue to follow temperature curve.  Continue to hold on antibiotics.   Pulmonary embolus ([name]) Noted to have possible acute on chronic pulmonary embolism on CT.  Also have some evidence of possible pulmonary hypertension.  Patient was previously managed on Eliquis which is transitioned to Lovenox  Currently patient is on apixaban with good toleration.   Pressure injury of skin Lateral penis, posterior right heel, right foot. Not present on admission.  Pressure Injury 05/25/21 Heel Posterior;Right Deep Tissue Pressure Injury - Purple or maroon localized area of discolored intact skin or blood-filled blister due to damage of underlying soft tissue from pressure and/or shear. (Active)  05/25/21 0800  Location: Heel  Location Orientation: Posterior;Right  Staging: Deep Tissue Pressure Injury - Purple or maroon localized area of discolored intact skin or blood-filled blister due to damage of underlying soft tissue from pressure and/or shear.  Wound Description (Comments):   Present on Admission: No  Dressing Type Foam - Lift dressing to assess site every shift 06/27/21 0820     Pressure Injury 05/25/21 Foot Right Stage 2 -  Partial thickness loss of dermis presenting as a shallow open injury with a red, pink wound bed without slough. (Active)  05/25/21 0800  Location: Foot  Location  Orientation: Right  Staging: Stage 2 -  Partial thickness loss of dermis presenting as a shallow open injury with a red, pink wound bed without slough.  Wound Description (Comments):   Present on Admission: No  Dressing Type Foam - Lift dressing to assess site every shift 06/27/21 0820    HTN (hypertension) Continue blood pressure control with amlodipine, carvedilol, hydralazine, isosorbide and spironolactone.  Today blood pressure systolic down to 98, will decrease hydralazine to 25 mg and will add holding parameters.   HFrEF (heart failure with reduced ejection fraction) ([place]) 2D echocardiogram-LVEF < 20%,  Patient euvolemic, continue medical therapy with carvedilol, isosorbide/ hydralazine and spironolactone As needed furosemide.   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  Continue current AED with Klonopin, Keppra. Per neuro notes, suppressing his myoclonus will not ultimately change the prognosis         Subjective: Patient with no new events, continue to tolerate tube feedings. Tolerating trach well.   Physical Exam: Vitals:   07/24/21 0704 07/24/21 0744 07/24/21 0745 07/24/21 1118  BP: (!) 146/103  (!) 146/105 (!) 130/104  Pulse:   93 88  Resp:   (!) 22   Temp:  99.9 F (37.7 C) 99.9 F (37.7 C)   TempSrc:   Oral   SpO2:   99%   Weight:      Height:       Neurology eyes closed, not following commands ENT with trach in place, able to cough secretions Cardiovascular with S1 and S2 present and rhythmic Respiratory with no rales, intermittent proximal  rhonchi Abdomen with peg tube in place No lower extremity edema Legs with boots in place to prevent skin pressure wounds  Data Reviewed:    Family Communication: no family at the bedside   Disposition: Status is: Inpatient Remains inpatient appropriate because: pending placement   Planned Discharge Destination: Home    Author: Tawni Millers, MD 07/24/2021  2:15 PM  For on call review [email or web address].

## 2021-07-25 NOTE — Progress Notes (Signed)
Nutrition Follow-up  DOCUMENTATION CODES:   Not applicable  INTERVENTION:  Continue tube feeding via PEG:  1.5 cartons of Osmolite 1.5 - QID (355 mL each bolus) 45 mL ProSource TF - BID 300 mL free water q4h  Provides 2210 kcal, 111 gm PRO, and 1086 mL free water (2879 mL total free water) daily.    Continue Juven (or equivalent) BID per tube, each packet provides 80 calories, 8 grams of carbohydrate, 2.5  grams of protein to promote wound healing  NUTRITION DIAGNOSIS:   Inadequate oral intake related to inability to eat as evidenced by NPO status; ongoing  GOAL:   Patient will meet greater than or equal to 90% of their needs; met with TF  MONITOR:   Labs, Weight trends, TF tolerance, Skin, I & O's  REASON FOR ASSESSMENT:   Ventilator, Consult Enteral/tube feeding initiation and management  ASSESSMENT:   51 year old male who originally presented to [facility] on 1/19 after out-of-hospital cardiac arrest due to suspected angioedema from ACE inhibitor leading to asphyxiation. PMH of HTN, HLD.   2/11 - tracheostomy placed 2/22 - PEG placed   Pt has been tolerating his tube feeds. Current plans to continue tube feeding regimen. Per MD, pt remains medically stable and awaiting safe disposition. Labs and medications reviewed.   Diet Order:  NPO Diet Order     None       EDUCATION NEEDS:   Not appropriate for education at this time  Skin:  Skin Assessment: Reviewed RN Assessment Skin Integrity Issues:: Unstageable DTI: N/A Stage II: R foot Stage III: N/A Unstageable: R heel Other: MASD Buttocks  Last BM:  5/21  Height:   Ht Readings from Last 1 Encounters:  04/18/21 $RemoveB'5\' 10"'[id number]$  (1.778 m)    Weight:   Wt Readings from Last 1 Encounters:  07/23/21 94 kg    Ideal Body Weight:  75.5 kg  BMI:  Body mass index is 29.73 kg/m.  Estimated Nutritional Needs:   Kcal:  2200-2400  Protein:  110-125 grams  Fluid:  >/= 2.2 L  Corrin Parker, MS, RD, LDN RD  pager number/after hours weekend pager number on Amion.

## 2021-07-25 NOTE — Progress Notes (Signed)
Progress Note   Patient: Billy Cherry [id number] DOB: 04/08/1970 DOA: 03/30/2021     117 DOS: the patient was seen and examined on 07/25/2021   Brief hospital course: 51 years old male with past medical history of hypertension was brought into the hospital after being found unresponsive by his wife.   Admitted to [facility] with out of hospital cardiac arrest ventricular fibrillation, in the setting of respiratory arrest due to suspected angioedema from ace inhibitor leading to asphyxiation.   1/19 left heart cath without significant CAD.  Concern for anoxic brain injury 1/21: MRI shows areas of hypoxic-ischemic injury bilaterally. Fevered; cultures obtained. 1/23 severe brain damage 1/25: PEG . Neuro exam unchanged.  Does exhibit new eyelid tremor like movements and lip/jaw tremor like vs clonus movement.  Bolused Keppra and increased maintenance dose.  Repeat EEG. CTH: concerning for progression of anoxic injury.  1/26: Repeat EEG unchanged and continues to show myoclonic seizures despite addition of Valproic acid yesterday.  03/30/2021 Transfer to [name] from [place] for continuous EEG 1/28: Continues to have myoclonic sz on EEG 2/3 no meaningful neuro recovery. Diuresed 2/6 New fever to 102, WBC 16K - treated with vancomycin for 7d based on cultures.  2/11: percutaneous tracheostomy 2/13 >2/20 on trach collar. No vent.  2/20 Trach change to 6 cuffless 2/28: fever 3/13: eliquis started 4/3: fever 06/06/2021 CT of the chest with new PE and haziness consistent with pneumonia 4/5-4/9: responding well to PRN lasix 4/11 fever again after completion of abx- work up in progress 4/13 continues to have low-grade temps cultures from 4/11 no growth to date-suspect silent/occult aspiration-heparin changed back to Eliquis 4/13 4/14: Work-up negative with no growth to date but red menance Tmax elevated 99.4 and respiratory rate up-x-ray did not show         any pneumonia but probably silently  aspirating  04/24 patient has been clinically stable, tolerating tube feedings and managing trach secretions, no increase in 02 requirements. Continue not interactive and poor prognosis.  04/25 no clinical sings of infection.    Now remains stable medically, awaiting safe disposition, home care/nursing arrangement, TOC/LLOS following  Plan for possible dc home once home assistance has been confirmed.    Assessment and Plan: * Cardiac arrest ([place]) Acute systolic heart failure.   Echocardiogram with reduced LV systolic function [id number], with global hypokinesis. RV systolic function is preserved. No significant valvular disease.   Currently with compensated systolic heart failure.   Continue with carvedilol, hydralazine, isosorbide and spironolactone.   Patient non ambulatory with very poor prognosis.   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  #6 Shiley cuffed.   Continue supportive care.   Patient currently keeps his eyes open, but does not answer questions or follow commands.  Poor prognosis.  Patient not interactive, not following commands.   Continue with clonazepam, keppra, and valproic acid.  Neuro checks per unit protocol, aspiration precautions.  Pending confirmation for home assistance before discharge.   Acute respiratory failure ([name]) Status post trach collar.  Continue supportive care Oxygenation is 99% on 5 L per trach collar with Fi02 28%. Managing secretions well.  Patient has a cuff less trach #6, continue care per protocol.   RLL pneumonia Previously had completed vancomycin for MSSA and [name] in early march.  Received more than 7-day course of IV Fortaz. On 4/3, CT chest was ordered that indicated an area of alveolar infiltration that may represent pneumonia.  Tracheal aspirate has been sent for  culture and is significant for normal respiratory flora. Concerning for aspiration. -s/p Unasyn x 7 days  Patient has remained  afebrile.  Continue aspiration precautions.  Currently off antibiotic therapy.  Pneumonia has resolved.   DVT (deep venous thrombosis) ([name]) Duplex ultrasound of lower extremity on 05/15/2021 showed age indeterminate deep vein thrombosis involving the left femoral vein, left proximal profunda vein, left posterior tibial veins, left popliteal vein, and left peroneal veins. Initially on Eliquis then changed to Lovenox  Currently continue anticoagulation with apixaban.    Dysphagia Continue PEG tube feeding.  Tolerating but high risk of aspiration -Aspiration precautions  Fever Patient has remained now afebrile, Continue to follow temperature curve.  Continue to hold on antibiotics.   Pulmonary embolus ([name]) Noted to have possible acute on chronic pulmonary embolism on CT.  Also have some evidence of possible pulmonary hypertension.  Patient was previously managed on Eliquis which is transitioned to Lovenox  Currently patient is on apixaban with good toleration.   Pressure injury of skin Lateral penis, posterior right heel, right foot. Not present on admission.  Pressure Injury 05/25/21 Heel Posterior;Right Deep Tissue Pressure Injury - Purple or maroon localized area of discolored intact skin or blood-filled blister due to damage of underlying soft tissue from pressure and/or shear. (Active)  05/25/21 0800  Location: Heel  Location Orientation: Posterior;Right  Staging: Deep Tissue Pressure Injury - Purple or maroon localized area of discolored intact skin or blood-filled blister due to damage of underlying soft tissue from pressure and/or shear.  Wound Description (Comments):   Present on Admission: No  Dressing Type Foam - Lift dressing to assess site every shift 06/27/21 0820     Pressure Injury 05/25/21 Foot Right Stage 2 -  Partial thickness loss of dermis presenting as a shallow open injury with a red, pink wound bed without slough. (Active)  05/25/21 0800  Location: Foot   Location Orientation: Right  Staging: Stage 2 -  Partial thickness loss of dermis presenting as a shallow open injury with a red, pink wound bed without slough.  Wound Description (Comments):   Present on Admission: No  Dressing Type Foam - Lift dressing to assess site every shift 06/27/21 0820    HTN (hypertension) Continue blood pressure control with amlodipine, carvedilol, hydralazine, isosorbide and spironolactone.   HFrEF (heart failure with reduced ejection fraction) ([place]) 2D echocardiogram-LVEF < 20%,  Patient euvolemic, continue medical therapy with carvedilol, isosorbide/ hydralazine and spironolactone As needed furosemide.   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  Continue current AED with Klonopin, Keppra. Per neuro notes, suppressing his myoclonus will not ultimately change the prognosis         Subjective: Patient has been stable, no significant change in his clinical condition.   Physical Exam: Vitals:   07/25/21 0726 07/25/21 0751 07/25/21 1039 07/25/21 1111  BP: (!) 126/97 (!) 127/93 (!) 146/110 110/75  Pulse: 95 95 91 91  Resp: (!) 25 (!) 21 (!) 23 20  Temp:  99.1 F (37.3 C)  99.4 F (37.4 C)  TempSrc:  Oral  Oral  SpO2: 99% 98% 97% 93%  Weight:      Height:       Neurology eyes closed and not following command ENT with trach in place Cardiovascular with S1 and S2 present and rhythmic Respiratory with no wheezing or rales on anterior auscultation  Abdomen non tender No lower extremity edema (protective boots in place)  Data Reviewed:    Family  Communication: no family at the bedside   Disposition: Status is: Inpatient Remains inpatient appropriate because: pending placement   Planned Discharge Destination: Home      Author: Tawni Millers, MD 07/25/2021 2:29 PM  For on call review [email or web address].

## 2021-07-25 NOTE — Progress Notes (Signed)
Patient is a chronic yellow MEWS due to LOC   07/25/21 0751  Vitals  Temp 99.1 F (37.3 C)  Temp Source Oral  BP (!) 127/93  MAP (mmHg) 103  Pulse Rate 95  ECG Heart Rate 96  Resp (!) 21  Level of Consciousness  Level of Consciousness Responds to Pain  MEWS COLOR  MEWS Score Color Yellow  Oxygen Therapy  SpO2 98 %  MEWS Score  MEWS Temp 0  MEWS Systolic 0  MEWS Pulse 0  MEWS RR 1  MEWS LOC 2  MEWS Score 3

## 2021-07-26 DIAGNOSIS — I2699 Other pulmonary embolism without acute cor pulmonale: Secondary | ICD-10-CM

## 2021-07-26 NOTE — Progress Notes (Signed)
   07/26/21 1758  Vitals  Temp 98.2 F (36.8 C)  Temp Source Axillary  BP 97/64  MAP (mmHg) 75  BP Location Right Arm  BP Method Automatic  Patient Position (if appropriate) Lying  Pulse Rate 83  ECG Heart Rate 84  Resp (!) 21  MEWS COLOR  MEWS Score Color Red  Oxygen Therapy  SpO2 100 %  MEWS Score  MEWS Temp 0  MEWS Systolic 1  MEWS Pulse 0  MEWS RR 1  MEWS LOC 2  MEWS Score 4   Chronic yellow red

## 2021-07-26 NOTE — Progress Notes (Signed)
   07/26/21 0800  Assess: MEWS Score  Temp 98.9 F (37.2 C)  BP (!) 135/100  Pulse Rate 93  ECG Heart Rate 93  Resp (!) 24  O2 Device Tracheostomy Collar  Assess: if the MEWS score is Yellow or Red  Were vital signs taken at a resting state? Yes  Focused Assessment No change from prior assessment  Early Detection of Sepsis Score *See Row Information* Low  MEWS guidelines implemented *See Row Information* No, vital signs rechecked  Notify: Charge Nurse/RN  Name of Charge Nurse/RN Notified Adron Bene  Date Charge Nurse/RN Notified 07/26/21  Time Charge Nurse/RN Notified [id number]  Document  Patient Outcome Stabilized after interventions  Progress note created (see row info) Yes   Chronic Yellow/Red [place]

## 2021-07-26 NOTE — Progress Notes (Signed)
Progress Note   Patient: Billy Cherry [id number] DOB: [birth year] DOA: 03/30/2021     118 DOS: the patient was seen and examined on 07/26/2021   Brief hospital course: 51 years old male with past medical history of hypertension was brought into the hospital after being found unresponsive by his wife.  Patient was initially admitted to [facility] hospital and was noted to have out of hospital cardiac arrest and ventricular fibrillation with respiratory arrest suspected from angioedema from ACE inhibitor leading to asphyxiation.    Sequence of events so far. 1/19 left heart cath without significant CAD.  Concern for anoxic brain injury 1/21: MRI shows areas of hypoxic-ischemic injury bilaterally. Fevered; cultures obtained. 1/23 severe brain damage 1/25: PEG . Neuro exam unchanged.  Does exhibit new eyelid tremor like movements and lip/jaw tremor like vs clonus movement.  Bolused Keppra and increased maintenance dose.  Repeat EEG. CTH: concerning for progression of anoxic injury.  1/26: Repeat EEG unchanged and continues to show myoclonic seizures despite addition of Valproic acid yesterday.  03/30/2021 Transfer to [name] from [place] for continuous EEG 1/28: Continues to have myoclonic sz on EEG 2/3 no meaningful neuro recovery. Diuresed 2/6 New fever to 102, WBC 16K - treated with vancomycin for 7d based on cultures.  2/11: percutaneous tracheostomy 2/13 >2/20 on trach collar. No vent.  2/20 Trach change to 6 cuffless 2/28: fever 3/13: eliquis started 4/3: fever 06/06/2021 CT of the chest with new PE and haziness consistent with pneumonia 4/5-4/9: responding well to PRN lasix 4/11 fever again after completion of abx- work up in progress 4/13 continues to have low-grade temps cultures from 4/11 no growth to date-suspect silent/occult aspiration-heparin changed back to Eliquis 4/13 4/14: Work-up negative with no growth to date but red menance Tmax elevated 99.4 and respiratory rate  up-x-ray did not show         any pneumonia but probably silently aspirating    Assessment and Plan: * Cardiac arrest ([place]), systolic heart failure. 2D echocardiogram showed left ventricular ejection fraction of less than 20% with global hypokinesis.  Was given diuretics including hydralazine isosorbide and spironolactone.  Currently compensated.  Very poor prognosis.  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  Continue supportive care.  Poor prognosis.  Not interactive, continue clonazepam, keppra, and valproic acid.  Continue aspiration precautions.  Acute respiratory failure ([name]) Status post trach collar.  Continue supportive care. Oxygenation is 99% on 5 L per trach collar with Fi02 28%.Patient has a cuff less trach #6, continue care per protocol.  PCCM following for trach needs.  RLL pneumonia Initially received a 7-day course of IV Fortaz. On 4/3, CT chest was ordered that indicated an area of alveolar infiltration that may represent pneumonia.  Tracheal aspirate has been sent for culture and is significant for normal respiratory flora. Concerning for aspiration.  Status post completion of 7-day course of Unasyn at this time.  Continue aspiration precautions.  Currently stable and afebrile.  Continue suctioning.  DVT (deep venous thrombosis) pulmonary embolism. Duplex ultrasound of lower extremity on 05/15/2021 showed age indeterminate deep vein thrombosis involving the left femoral vein, left proximal profunda vein, left posterior tibial veins, left popliteal vein, and left peroneal veins.  CT scan of the chest with possible acute on chronic PE.  On Eliquis at this time.  We will continue  Dysphagia Status post PEG tube placement and on tube feeding.   Fever Has improved at this time.  Status post antibiotic.  High risk for aspiration.  We will continue to monitor  Pressure injury of skin Lateral penis, posterior right heel, right foot. Not present  on admission.  Pressure Injury 05/25/21 Heel Posterior;Right Deep Tissue Pressure Injury - Purple or maroon localized area of discolored intact skin or blood-filled blister due to damage of underlying soft tissue from pressure and/or shear. (Active)  05/25/21 0800  Location: Heel  Location Orientation: Posterior;Right  Staging: Deep Tissue Pressure Injury - Purple or maroon localized area of discolored intact skin or blood-filled blister due to damage of underlying soft tissue from pressure and/or shear.  Wound Description (Comments):   Present on Admission: No  Dressing Type Foam - Lift dressing to assess site every shift 06/27/21 0820     Pressure Injury 05/25/21 Foot Right Stage 2 -  Partial thickness loss of dermis presenting as a shallow open injury with a red, pink wound bed without slough. (Active)  05/25/21 0800  Location: Foot  Location Orientation: Right  Staging: Stage 2 -  Partial thickness loss of dermis presenting as a shallow open injury with a red, pink wound bed without slough.  Wound Description (Comments):   Present on Admission: No  Dressing Type Foam - Lift dressing to assess site every shift 06/27/21 0820  Continue wound care while in the hospital  HTN (hypertension) Continue with amlodipine, carvedilol, hydralazine, isosorbide and spironolactone.   HFrEF (heart failure with reduced ejection fraction) ([name]) 2D echocardiogram-LVEF < 20%, at this time patient is on as needed diuretic.  Continue with carvedilol, isosorbide/ hydralazine and spironolactone  Hypernatremia Resolved with free water flushes sodium of 136.  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  Continue  Klonopin, Keppra. Per neuro notes, suppressing his myoclonus will not ultimately change the prognosis   Subjective:   Today, patient was seen and examined at bedside.  Nonverbal.  No changes as per the nursing staff.  Secretions manageable.  Physical Exam: Vitals:   07/25/21 2002 07/25/21 2300 07/26/21 0356  07/26/21 0425  BP: (!) 128/97 (!) 128/97 (!) 123/100 (!) 123/100  Pulse: 89 92 92 93  Resp: 19 (!) 22 (!) 23 (!) 25  Temp: 98.8 F (37.1 C)  99.4 F (37.4 C)   TempSrc: Axillary  Axillary   SpO2: 99% 99% 99% 98%  Weight:   94 kg   Height:       General: Chronically ill,, not in obvious distress, eyes closed, does not follow command, HENT:   No scleral pallor or icterus noted.  Tracheostomy in place Chest:    Diminished breath sounds bilaterally.  No wheezing noted anteriorly. CVS: S1 &S2 heard. No murmur.  Regular rate and rhythm. Abdomen: Soft, nontender, nondistended.  Bowel sounds are heard.  In place. Extremities: Flaccid extremities.   Psych: Nonresponsive. CNS: Eyes closed, does not follow commands, Skin: Warm and dry.    Data Reviewed: Reviewed labs over the last 2 days.  Latest sodium of 136.  Family Communication: no family at the bedside  Disposition: Difficult disposition.  Status is: Inpatient  Remains inpatient appropriate because:  awaiting safe disposition, significant needs, home care/nursing arrangement, TOC/LLOS following.  Author: Flora Lipps, MD 07/26/2021 8:02 AM  For on call review [email or web address].

## 2021-07-26 NOTE — Progress Notes (Signed)
Vomiting per RT report Prn Zofran 4 mg iv given.Md Informed.

## 2021-07-26 NOTE — TOC Progression Note (Addendum)
Transition of Care [facility]) - Progression Note    Patient Details  Name: Billy Cherry MRN: [id number] Date of Birth: 1971/02/10  Transition of Care [facility]) CM/SW Contact  Janae Bridgeman, RN Phone Number: 07/26/2021, 12:05 PM  Clinical Narrative:    CM called and spoke with Jeanmarie Plant, Director of [employer] and he states that the patient's wife, Theola Sequin, called and cancelled the home meeting that was scheduled for this morning at 10 am.  Tod Persia, states that the patient's wife reschedule the family meeting for 07/27/2021 at 10 am.  I called and spoke with the patient's wife, Theola Sequin on the phone and explained to the patient's wife that family meeting would need to occur tomorrow as scheduled since the patient is medically stable to discharge to the home and the patient's wife would need to keep these appointments so that safe care could be arranged at the home.  The wife states that she was working at [facility] today and that she plans to meet with [facility] tomorrow morning as scheduled.  Jiles Crocker, [facility] Supervisor was notified of the conversation so that hospital leadership could continue support and conversation with the patient's wife for discharge to home once [facility] duty can be established.  Macario Golds, LCSW, [facility] Supervisor was notified that the patient's wife has missed two previously planned meetings with [facility] healthcare and a planned meeting is scheduled to occur at the home tomorrow, 07/27/2021 and the wife was made aware that it is imperative that this meeting take place to plan for private duty nursing at the home to create safe discharge plan for the patient to return home. Hospital leadership was requested to support the discharge plan to home so that the patient's wife does not delay return to home in a timely manner.   Expected Discharge Plan: Home w Home Health Services Barriers to Discharge: Other (must enter  comment) (Patient's wife - requested private duty services through alternative provider for 12 hours shifts for staffing at the home.)  Expected Discharge Plan and Services Expected Discharge Plan: Home w Home Health Services In-house Referral: Clinical Social Work Discharge Planning Services: CM Consult, Follow-up appt scheduled Post Acute Care Choice: Home Health Living arrangements for the past 2 months: Single Family Home                 DME Arranged: Trach supplies, Tube feeding, Suction, Hospital bed, Oxygen DME Agency: [employer]       HH Arranged: RN (Private Duty Nursing through [employer] versus [place]) HH Agency: [employer] Services Date HH Agency Contacted: 07/04/21 Time HH Agency Contacted: 1000 Representative spoke with at [facility] Agency: Jeanmarie Plant, Director with [employer] [phone]   Social Determinants of Health (SDOH) Interventions    Readmission Risk Interventions    04/14/2021   11:14 AM  Readmission Risk Prevention Plan  Transportation Screening Complete  HRI or Home Care Consult Complete  Social Work Consult for Recovery Care Planning/Counseling Complete  Palliative Care Screening Complete  Medication Review Ocean[employer]) Complete

## 2021-07-27 LAB — CBC
HCT: 38.8 % — ABNORMAL LOW (ref 39.0–52.0)
Hemoglobin: 12.8 g/dL — ABNORMAL LOW (ref 13.0–17.0)
MCH: 30.8 pg (ref 26.0–34.0)
MCHC: 33 g/dL (ref 30.0–36.0)
MCV: 93.3 fL (ref 80.0–100.0)
Platelets: 313 10*3/uL (ref 150–400)
RBC: 4.16 MIL/uL — ABNORMAL LOW (ref 4.22–5.81)
RDW: 13.2 % (ref 11.5–15.5)
WBC: 6.6 10*3/uL (ref 4.0–10.5)
nRBC: 0 % (ref 0.0–0.2)

## 2021-07-27 LAB — MAGNESIUM: Magnesium: 2 mg/dL (ref 1.7–2.4)

## 2021-07-27 LAB — BASIC METABOLIC PANEL
Anion gap: 6 (ref 5–15)
BUN: 36 mg/dL — ABNORMAL HIGH (ref 6–20)
CO2: 29 mmol/L (ref 22–32)
Calcium: 9 mg/dL (ref 8.9–10.3)
Chloride: 99 mmol/L (ref 98–111)
Creatinine, Ser: 0.63 mg/dL (ref 0.61–1.24)
GFR, Estimated: >60 mL/min (ref >60)
Glucose, Bld: 99 mg/dL (ref 70–99)
Potassium: 4.7 mmol/L (ref 3.5–5.1)
Sodium: 134 mmol/L — ABNORMAL LOW (ref 135–145)

## 2021-07-27 MED ORDER — FREE WATER
200.0000 mL | Freq: Four times a day (QID) | Status: DC
  Administered 2021-07-27 – 2021-08-14 (×74): 200 mL
  Administered 2021-08-15: 100 mL
  Administered 2021-08-15 – 2021-08-31 (×61): 200 mL
  Administered 2021-08-31 (×2): 60 mL
  Administered 2021-08-31 – 2021-10-04 (×135): 200 mL

## 2021-07-27 NOTE — TOC Progression Note (Addendum)
Transition of Care [facility]) - Progression Note    Patient Details  Name: Billy Cherry MRN: [id number] Date of Birth: September 24, 1970  Transition of Care [facility]) CM/SW Contact  Janae Bridgeman, RN Phone Number: 07/27/2021, 2:40 PM  Clinical Narrative:    CM called and spoke with Jeanmarie Plant, Director with [facility] and they were able to meet with the patient's wife this morning for a home visit and care discussion.  Maxim discussed with the patient's wife that they are able to provide a 12 hour nurse for daytime hours or overnight up to 48 hours.  I called and spoke with [name], CM with [facility] and they are in agreement to co-partnership for staffing in the home on the days that [facility] is unable to staff.  [name], CM with [facility] will call and speak with [facility] to coordinate nursing care in the home and present this plan to the patient's wife.  I called the wife and made her aware of the two companies coordinating care in the home and she was in agreement for this plan.  [facility] will check to be sure insurance authorization has been confirmed and plans are to discharge the patient home with the wife next week once nursing care hours in the home have been confirmed for a safe discharge plan.  The patient's wife states that she needs nursing coverage in the home on Monday, Tuesday, Wednesday and Friday from 8 am til 6 pm while she is working.  She confirmed that she has been taught trach care and PEG care/feedings and does not need additional teaching.  I asked that the wife speak with the patient's 51 year old son and plan to have him learn trach teaching in the next couple of days in the hospital.  The wife agreed and confirmed.  I sent a secure email to Lucius Conn, RT with Trach team at the hospital and requested teaching with the son as well.  Bedside nursing was notified and Staci Righter, RN states that she will notify evening and night shift along with available RT to teach the patient's  adult son trach teaching as well in case he comes by the hospital on off hours.  I spoke with the patient's wife on the phone and explained to her that once home private duty care has been established that patient will be discharged home next week by ambulance transport to the home.  I called [employer] today and requested that air mattress be delivered to the home the first of next week on Tuesday or Wednesday prior to discharge to home next Thursday.  Plan is to discharge home next week - no later than next Thursday, 08/03/2021.   Expected Discharge Plan: Home w Home Health Services Barriers to Discharge: Other (must enter comment) (Patient's wife - requested private duty services through alternative provider for 12 hours shifts for staffing at the home.)  Expected Discharge Plan and Services Expected Discharge Plan: Home w Home Health Services In-house Referral: Clinical Social Work Discharge Planning Services: CM Consult, Follow-up appt scheduled Post Acute Care Choice: Home Health Living arrangements for the past 2 months: Single Family Home                 DME Arranged: Trach supplies, Tube feeding, Suction, Hospital bed, Oxygen DME Agency: [employer]       HH Arranged: RN Insurance underwriter Duty Nursing through [employer] versus [employer]) HH Agency: [employer] Services Date HH Agency Contacted: 07/04/21 Time HH Agency Contacted: 1000 Representative  spoke with at [facility] Agency: Jeanmarie Plant, Director with [facility] [phone]   Social Determinants of Health (SDOH) Interventions    Readmission Risk Interventions    04/14/2021   11:14 AM  Readmission Risk Prevention Plan  Transportation Screening Complete  HRI or Home Care Consult Complete  Social Work Consult for Recovery Care Planning/Counseling Complete  Palliative Care Screening Complete  Medication Review Oceanographer) Complete

## 2021-07-27 NOTE — Progress Notes (Signed)
Progress Note   Patient: Billy Cherry [id number] DOB: 25-May-1970 DOA: 03/30/2021     119 DOS: the patient was seen and examined on 07/27/2021   Brief hospital course: 51 years old male with past medical history of hypertension was brought into the hospital after being found unresponsive by his wife.  Patient was initially admitted to [facility] hospital and was noted to have out of hospital cardiac arrest and ventricular fibrillation with respiratory arrest suspected from angioedema from ACE inhibitor leading to asphyxiation.    Sequence of events so far. 1/19 left heart cath without significant CAD.  Concern for anoxic brain injury 1/21: MRI shows areas of hypoxic-ischemic injury bilaterally. Fevered; cultures obtained. 1/23 severe brain damage 1/25: PEG . Neuro exam unchanged.  Does exhibit new eyelid tremor like movements and lip/jaw tremor like vs clonus movement.  Bolused Keppra and increased maintenance dose.  Repeat EEG. CTH: concerning for progression of anoxic injury.  1/26: Repeat EEG unchanged and continues to show myoclonic seizures despite addition of Valproic acid yesterday.  03/30/2021 Transfer to [name] from [place] for continuous EEG 1/28: Continues to have myoclonic sz on EEG 2/3 no meaningful neuro recovery. Diuresed 2/6 New fever to 102, WBC 16K - treated with vancomycin for 7d based on cultures.  2/11: percutaneous tracheostomy 2/13 >2/20 on trach collar. No vent.  2/20 Trach change to 6 cuffless 2/28: fever 3/13: eliquis started 4/3: fever 06/06/2021 CT of the chest with new PE and haziness consistent with pneumonia 4/5-4/9: responding well to PRN lasix 4/11 fever again after completion of abx- work up in progress 4/13 continues to have low-grade temps cultures from 4/11 no growth to date-suspect silent/occult aspiration-heparin changed back to Eliquis 4/13 4/14: Work-up negative with no growth to date but red menance Tmax elevated 99.4 and respiratory rate  up-x-ray did not show         any pneumonia but probably silently aspirating    Assessment and Plan: * Cardiac arrest ([place]), systolic heart failure. 2D echocardiogram showed left ventricular ejection fraction of less than 20% with global hypokinesis.  Was given diuretics including hydralazine isosorbide and spironolactone.  Currently compensated.  Very poor prognosis.  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  Continue supportive care.  Poor prognosis.  Not interactive, continue clonazepam, keppra, and valproic acid.  Continue aspiration precautions.  Acute respiratory failure ([name]) Status post trach collar.  Continue supportive care. Oxygenation is 99% on 5 L per trach collar with Fi02 28%.Patient has a cuff less trach #6, continue care per protocol.  PCCM following for trach needs.  RLL pneumonia Initially received a 7-day course of IV Fortaz. On 4/3, CT chest was ordered that indicated an area of alveolar infiltration that may represent pneumonia.  Tracheal aspirate has been sent for culture and is significant for normal respiratory flora. Concerning for aspiration.  Status post completion of 7-day course of Unasyn at this time.  Continue aspiration precautions.  Currently stable and afebrile.  Continue suctioning.  DVT (deep venous thrombosis) pulmonary embolism. Duplex ultrasound of lower extremity on 05/15/2021 showed age indeterminate deep vein thrombosis involving the left femoral vein, left proximal profunda vein, left posterior tibial veins, left popliteal vein, and left peroneal veins.  CT scan of the chest with possible acute on chronic PE.  On Eliquis at this time.  We will continue  Dysphagia Status post PEG tube placement and on tube feeding.   Fever Has improved at this time.  Status post antibiotic.  High risk for aspiration -continue to monitor  Pressure injury of skin Lateral penis, posterior right heel, right foot. Not present on  admission.  Pressure Injury 05/25/21 Heel Posterior;Right Deep Tissue Pressure Injury - Purple or maroon localized area of discolored intact skin or blood-filled blister due to damage of underlying soft tissue from pressure and/or shear. (Active)  05/25/21 0800  Location: Heel  Location Orientation: Posterior;Right  Staging: Deep Tissue Pressure Injury - Purple or maroon localized area of discolored intact skin or blood-filled blister due to damage of underlying soft tissue from pressure and/or shear.  Wound Description (Comments):   Present on Admission: No  Dressing Type Foam - Lift dressing to assess site every shift 06/27/21 0820     Pressure Injury 05/25/21 Foot Right Stage 2 -  Partial thickness loss of dermis presenting as a shallow open injury with a red, pink wound bed without slough. (Active)  05/25/21 0800  Location: Foot  Location Orientation: Right  Staging: Stage 2 -  Partial thickness loss of dermis presenting as a shallow open injury with a red, pink wound bed without slough.  Wound Description (Comments):   Present on Admission: No  Dressing Type Foam - Lift dressing to assess site every shift 06/27/21 0820  Continue wound care while in the hospital  HTN (hypertension) Continue with amlodipine, carvedilol, hydralazine, isosorbide and spironolactone.   HFrEF (heart failure with reduced ejection fraction) ([name]) 2D echocardiogram-LVEF < 20%, at this time patient is on as needed diuretic.  Continue with carvedilol, isosorbide/ hydralazine and spironolactone  Hypernatremia Resolved with free water flushes - will decrease amount  Myoclonus Seen by neurology.  Continue  Klonopin, Keppra. Per neuro notes, suppressing his myoclonus will not ultimately change the prognosis   Subjective:   Appears to have had an episode of vomiting last PM  Physical Exam: Vitals:   07/27/21 0400 07/27/21 0458 07/27/21 0730 07/27/21 0856  BP: (!) 143/100  (!) 153/113   Pulse: 96  97 95   Resp: 20  19 20   Temp: 98.8 F (37.1 C)  99.6 F (37.6 C)   TempSrc: Axillary  Oral   SpO2: 98%  98% 97%  Weight:  95 kg    Height:         General: Appearance:    Obese male in no acute distress     Lungs:     respirations unlabored, trach in place  Heart:    Normal heart rate.  MS:   All extremities are intact.   Neurologic:   Not following commands, will open eyes when touched       Family Communication: no family at the bedside  Disposition: Difficult disposition.  Status is: Inpatient  Remains inpatient appropriate because:  awaiting safe disposition, significant needs, home care/nursing arrangement, TOC/LLOS following.  Author: Geradine Girt, DO 07/27/2021 10:44 AM  For on call review [email or web address].

## 2021-07-27 NOTE — TOC Progression Note (Addendum)
Transition of Care [facility]) - Progression Note    Patient Details  Name: Billy Cherry MRN: [id number] Date of Birth: 30-Jun-1970  Transition of Care [facility]) CM/SW Contact  Janae Bridgeman, RN Phone Number: 07/27/2021, 9:10 AM  Clinical Narrative:    CM called and spoke with Jeanmarie Plant, Director of admissions with [facility] and he is planning on home visit and meeting with the patient's wife at the home today at 10 am to present private duty coverage at the home - acceptance of the offer and transfer of insurance authorization for private duty through the patient's Medicaid coverage.  Insurance authorization may likely take 1-2 weeks to approve per [employer] and CM will continue to follow.  [employer] initiates authorization process.  Updated clinicals were faxed to attention of [place] this morning.  I spoke with the patient's wife on the phone after yesterday's family meeting was postponed until today to continue discussion that the family meeting will need to take place today - otherwise hospital leadership will consult legal team at the hospital relating to the patient's wife delaying progress of the plan for discharge to home.  Macario Golds, Supervisor with TOC is aware and advised as such on 07/26/21.  I called and spoke with Tod Persia, Interior and spatial designer with [employer] and asked for update after the family meeting today and explained that meeting should remain as scheduled and I requested that insurance authorization be expedited as necessary.  TOC Team with DTP and hospital leadership will continue to communicate with the patient's wife for safe discharge plans to home once the insurance authorization is complete.  All DME is present in the home since 07/06/2021 - [employer] plans to deliver air mattress for hospital bed that is present.  [employer] will need to be contacted and given definitive discharge date so that tube feeding formula and mattress can be delivered.  No definitive  discharge date to home has been determined at this time  - pending family meeting today and insurance authorization approval through [employer].  Hospital leadership is following.  07/27/2021 1108 - CM called and left a message with Jeanmarie Plant to follow up regarding home visit with the patient's wife scheduled for 10 am.   Expected Discharge Plan: Home w Home Health Services Barriers to Discharge: Other (must enter comment) (Patient's wife - requested private duty services through alternative provider for 12 hours shifts for staffing at the home.)  Expected Discharge Plan and Services Expected Discharge Plan: Home w Home Health Services In-house Referral: Clinical Social Work Discharge Planning Services: CM Consult, Follow-up appt scheduled Post Acute Care Choice: Home Health Living arrangements for the past 2 months: Single Family Home                 DME Arranged: Trach supplies, Tube feeding, Suction, Hospital bed, Oxygen DME Agency: [employer]       HH Arranged: RN (Private Duty Nursing through [employer] versus [name]) HH Agency: [employer] Services Date HH Agency Contacted: 07/04/21 Time HH Agency Contacted: 1000 Representative spoke with at [facility] Agency: Jeanmarie Plant, Director with [employer] [phone]   Social Determinants of Health (SDOH) Interventions    Readmission Risk Interventions    04/14/2021   11:14 AM  Readmission Risk Prevention Plan  Transportation Screening Complete  HRI or Home Care Consult Complete  Social Work Consult for Recovery Care Planning/Counseling Complete  Palliative Care Screening Complete  Medication Review Oceanographer) Complete

## 2021-07-28 NOTE — TOC Progression Note (Addendum)
Transition of Care [facility]) - Progression Note    Patient Details  Name: Billy Cherry MRN: [id number] Date of Birth: Jul 11, 1970  Transition of Care [facility]) CM/SW Contact  Janae Bridgeman, RN Phone Number: 07/28/2021, 1:54 PM  Clinical Narrative:    CM called and spoke with Janey Greaser, Admissions Director with [facility] duty and he states that he spoke with Olevia Perches, Director with [employer] and both agencies were hoping to cover private duty care in the home - but this has not been established yet.  Insurance authorization will need to be restarted with [employer] for the hours needed once staffing is confirmed.  Updated clinicals were sent to [facility] today.  I called [employer] and was unable to speak with Brendon, admission Interior and spatial designer, who is out of town until next week to follow up regarding staffing progress.  Previous insurance authorization that was complete on 07/06/2021 is not longer active and available to cover cost of care - new authorization will need to be restarted by [employer].  [facility] has available nurse to cover 4 - 12 hour shifts at this time on Monday, Tuesday, Wednesday and Friday.  84 total hours/ week of private duty nursing will need to be covered for the nursing duties in the home.  [place] states that he has reached out to the [place], [place] office to assist with coverage of these hours.  07/28/2021 1443 - I called and spoke with Jess Barters, Admissions with [facility] and he is starting insurance authorization and will continue to coordinate care with [employer].  Jess Barters was given [name] Medlin's cell number to further discuss staffing in the home.  The patient's wife is aware that I have requested that the patient's adult son receive trach teaching as well as requested by [employer].    TOC DTP Team will continue to follow the patient for discharge planning to home once private duty staffing can be safely established at  the home and insurance authorization can be completed by [employer].  CM and MSW with DTP Team will continue to follow the patient for discharge planning.   Expected Discharge Plan: Home w Home Health Services Barriers to Discharge: Other (must enter comment) (Patient's wife - requested private duty services through alternative provider for 12 hours shifts for staffing at the home.)  Expected Discharge Plan and Services Expected Discharge Plan: Home w Home Health Services In-house Referral: Clinical Social Work Discharge Planning Services: CM Consult, Follow-up appt scheduled Post Acute Care Choice: Home Health Living arrangements for the past 2 months: Single Family Home                 DME Arranged: Trach supplies, Tube feeding, Suction, Hospital bed, Oxygen DME Agency: [employer]       HH Arranged: RN (Private Duty Nursing through [employer] versus [place]) HH Agency: [employer] Services Date HH Agency Contacted: 07/04/21 Time HH Agency Contacted: 1000 Representative spoke with at [facility] Agency: Jeanmarie Plant, Director with [employer] [phone]   Social Determinants of Health (SDOH) Interventions    Readmission Risk Interventions    04/14/2021   11:14 AM  Readmission Risk Prevention Plan  Transportation Screening Complete  HRI or Home Care Consult Complete  Social Work Consult for Recovery Care Planning/Counseling Complete  Palliative Care Screening Complete  Medication Review Oceanographer) Complete

## 2021-07-28 NOTE — Progress Notes (Signed)
Progress Note   Patient: Billy Cherry [id number] DOB: [birth year] DOA: 03/30/2021     120 DOS: the patient was seen and examined on 07/28/2021   Brief hospital course: 51 years old male with past medical history of hypertension was brought into the hospital after being found unresponsive by his wife.  Patient was initially admitted to [facility] hospital and was noted to have out of hospital cardiac arrest and ventricular fibrillation with respiratory arrest suspected from angioedema from ACE inhibitor leading to asphyxiation.    Sequence of events so far. 1/19 left heart cath without significant CAD.  Concern for anoxic brain injury 1/21: MRI shows areas of hypoxic-ischemic injury bilaterally. Fevered; cultures obtained. 1/23 severe brain damage 1/25: PEG . Neuro exam unchanged.  Does exhibit new eyelid tremor like movements and lip/jaw tremor like vs clonus movement.  Bolused Keppra and increased maintenance dose.  Repeat EEG. CTH: concerning for progression of anoxic injury.  1/26: Repeat EEG unchanged and continues to show myoclonic seizures despite addition of Valproic acid yesterday.  03/30/2021 Transfer to [facility] from [place] for continuous EEG 1/28: Continues to have myoclonic sz on EEG 2/3 no meaningful neuro recovery. Diuresed 2/6 New fever to 102, WBC 16K - treated with vancomycin for 7d based on cultures.  2/11: percutaneous tracheostomy 2/13 >2/20 on trach collar. No vent.  2/20 Trach change to 6 cuffless 2/28: fever 3/13: eliquis started 4/3: fever 06/06/2021 CT of the chest with new PE and haziness consistent with pneumonia 4/5-4/9: responding well to PRN lasix 4/11 fever again after completion of abx- work up in progress 4/13 continues to have low-grade temps cultures from 4/11 no growth to date-suspect silent/occult aspiration-heparin changed back to Eliquis 4/13 4/14: Work-up negative with no growth to date but red menance Tmax elevated 99.4 and respiratory rate  up-x-ray did not show         any pneumonia but probably silently aspirating    Assessment and Plan:  Cardiac arrest ([name]), systolic heart failure. 2D echocardiogram showed left ventricular ejection fraction of less than 20% with global hypokinesis.  Was given diuretics including hydralazine isosorbide and spironolactone.  Currently compensated.  Very poor prognosis.  Anoxic encephalopathy ([name]) Status post tracheostomy and PEG tube placement.  Continue supportive care.  Poor prognosis.  Not interactive, continue clonazepam, keppra, and valproic acid.  Continue aspiration precautions.  Acute respiratory failure ([name]) Status post trach collar.  Continue supportive care. Oxygenation is 99% on 5 L per trach collar with Fi02 28%.Patient has a cuff less trach #6, continue care per protocol.  PCCM following for trach needs.  RLL pneumonia Initially received a 7-day course of IV Fortaz. On 4/3, CT chest was ordered that indicated an area of alveolar infiltration that may represent pneumonia.  Tracheal aspirate has been sent for culture and is significant for normal respiratory flora. Concerning for aspiration.  Status post completion of 7-day course of Unasyn at this time.  Continue aspiration precautions.  Currently stable and afebrile.  Continue suctioning.  DVT (deep venous thrombosis) pulmonary embolism. Duplex ultrasound of lower extremity on 05/15/2021 showed age indeterminate deep vein thrombosis involving the left femoral vein, left proximal profunda vein, left posterior tibial veins, left popliteal vein, and left peroneal veins.  CT scan of the chest with possible acute on chronic PE.  On Eliquis at this time.  We will continue  Dysphagia Status post PEG tube placement and on tube feeding.   Fever Has improved at this time.  Status post antibiotic.  High risk for aspiration -continue to monitor  Pressure injury of skin Lateral penis, posterior right heel, right foot. Not present on  admission.  Pressure Injury 05/25/21 Heel Posterior;Right Deep Tissue Pressure Injury - Purple or maroon localized area of discolored intact skin or blood-filled blister due to damage of underlying soft tissue from pressure and/or shear. (Active)  05/25/21 0800  Location: Heel  Location Orientation: Posterior;Right  Staging: Deep Tissue Pressure Injury - Purple or maroon localized area of discolored intact skin or blood-filled blister due to damage of underlying soft tissue from pressure and/or shear.  Wound Description (Comments):   Present on Admission: No  Dressing Type Foam - Lift dressing to assess site every shift 06/27/21 0820     Pressure Injury 05/25/21 Foot Right Stage 2 -  Partial thickness loss of dermis presenting as a shallow open injury with a red, pink wound bed without slough. (Active)  05/25/21 0800  Location: Foot  Location Orientation: Right  Staging: Stage 2 -  Partial thickness loss of dermis presenting as a shallow open injury with a red, pink wound bed without slough.  Wound Description (Comments):   Present on Admission: No  Dressing Type Foam - Lift dressing to assess site every shift 06/27/21 0820  Continue wound care while in the hospital  HTN (hypertension) Continue with amlodipine, carvedilol, hydralazine, isosorbide and spironolactone.   HFrEF (heart failure with reduced ejection fraction) ([name]) 2D echocardiogram-LVEF < 20%, at this time patient is on as needed diuretic.  Continue with carvedilol, isosorbide/ hydralazine and spironolactone  Hypernatremia Resolved with free water flushes - will decrease amount of water as Na low  Myoclonus Seen by neurology.  Continue  Klonopin, Keppra. Per neuro notes, suppressing his myoclonus will not ultimately change the prognosis     Subjective:   No episodes overnight  Physical Exam: Vitals:   07/28/21 0000 07/28/21 0220 07/28/21 0400 07/28/21 0819  BP: 103/75  (!) 142/106   Pulse: 96 98 96 98  Resp: 20  (!) 22 15 18   Temp: 98.8 F (37.1 C)  98.5 F (36.9 C)   TempSrc: Axillary  Axillary   SpO2: 98%  98% 97%  Weight:      Height:         General: Appearance:    Obese male in no acute distress     Lungs:     respirations unlabored, trach in place  Heart:    Normal heart rate.   MS:   All extremities are intact. Does not follow commands  Neurologic:   Did not open eyes to touch today       Family Communication: no family at the bedside  Disposition: Difficult disposition- plan for d/c next week  Status is: Inpatient  Remains inpatient appropriate because:  awaiting safe disposition, significant needs, home care/nursing arrangement, TOC/LLOS following.  Author: Geradine Girt, DO 07/28/2021 9:37 AM  For on call review [email or web address].

## 2021-07-29 DIAGNOSIS — I462 Cardiac arrest due to underlying cardiac condition: Secondary | ICD-10-CM | POA: Diagnosis not present

## 2021-07-29 DIAGNOSIS — T783XXA Angioneurotic edema, initial encounter: Secondary | ICD-10-CM | POA: Diagnosis not present

## 2021-07-29 LAB — BASIC METABOLIC PANEL
Anion gap: 8 (ref 5–15)
BUN: 35 mg/dL — ABNORMAL HIGH (ref 6–20)
CO2: 30 mmol/L (ref 22–32)
Calcium: 9 mg/dL (ref 8.9–10.3)
Chloride: 100 mmol/L (ref 98–111)
Creatinine, Ser: 0.69 mg/dL (ref 0.61–1.24)
GFR, Estimated: >60 mL/min (ref >60)
Glucose, Bld: 118 mg/dL — ABNORMAL HIGH (ref 70–99)
Potassium: 4.6 mmol/L (ref 3.5–5.1)
Sodium: 138 mmol/L (ref 135–145)

## 2021-07-29 LAB — CBC
HCT: 39.3 % (ref 39.0–52.0)
Hemoglobin: 12.5 g/dL — ABNORMAL LOW (ref 13.0–17.0)
MCH: 30 pg (ref 26.0–34.0)
MCHC: 31.8 g/dL (ref 30.0–36.0)
MCV: 94.2 fL (ref 80.0–100.0)
Platelets: 324 10*3/uL (ref 150–400)
RBC: 4.17 MIL/uL — ABNORMAL LOW (ref 4.22–5.81)
RDW: 13.5 % (ref 11.5–15.5)
WBC: 6.8 10*3/uL (ref 4.0–10.5)
nRBC: 0 % (ref 0.0–0.2)

## 2021-07-29 NOTE — Progress Notes (Signed)
PROGRESS NOTE    Billy Cherry  [id number] DOB: 27-Mar-1970 DOA: 03/30/2021  PCP: Patient, No Pcp Per (Inactive)   Brief Narrative:  This 51 years old male with PMH significant of hypertension was brought into the hospital after being found unresponsive by his wife.  Patient was initially admitted to [facility] hospital and was noted to have out of hospital cardiac arrest and ventricular fibrillation with respiratory arrest suspected from angioedema from ACE inhibitor leading to asphyxiation.   Sequence of events so far. 1/19 left heart cath without significant CAD.  Concern for anoxic brain injury 1/21: MRI shows areas of hypoxic-ischemic injury bilaterally. Fevered; cultures obtained. 1/23 severe brain damage 1/25: PEG . Neuro exam unchanged.  Does exhibit new eyelid tremor like movements and lip/jaw tremor like vs clonus movement.  Bolused Keppra and increased maintenance dose.  Repeat EEG. CTH: concerning for progression of anoxic injury.  1/26: Repeat EEG unchanged and continues to show myoclonic seizures despite addition of Valproic acid yesterday.  03/30/2021 Transfer to [name] from [place] for continuous EEG 1/28: Continues to have myoclonic sz on EEG 2/3 no meaningful neuro recovery. Diuresed 2/6 New fever to 102, WBC 16K - treated with vancomycin for 7d based on cultures.  2/11: percutaneous tracheostomy 2/13 >2/20 on trach collar. No vent.  2/20 Trach change to 6 cuffless 2/28: fever 3/13: eliquis started 4/3: fever 06/06/2021 CT of the chest with new PE and haziness consistent with pneumonia 4/5-4/9: responding well to PRN lasix 4/11 fever again after completion of abx- work up in progress 4/13 continues to have low-grade temps cultures from 4/11 no growth to date-suspect silent/occult aspiration-heparin changed back to Eliquis 4/13 4/14: Work-up negative with no growth to date but red menance Tmax elevated 99.4 and respiratory rate up-x-ray did not show         any  pneumonia but probably silently aspirating  Assessment & Plan:   Principal Problem:   Cardiac arrest ([place]) Active Problems:   Anoxic encephalopathy ([place])   Acute respiratory failure ([name])   RLL pneumonia   Dysphagia   DVT (deep venous thrombosis) ([name])   Myoclonus   Hypernatremia   HFrEF (heart failure with reduced ejection fraction) ([name])   HTN (hypertension)   Pressure injury of skin   Pulmonary embolus ([name])   Fever  Cardiac arrest, systolic heart failure: 2D echo showed LVEF less than 20% with global hypokinesis. Currently compensated. Continue hydralazine, isosorbide, spironolactone . Very poor prognosis.  Anoxic encephalopathy: Status post tracheostomy and PEG tube placement.   Continue supportive care. Poor prognosis.  Patient is not interactive. Continue Clonazepam, Keppra and valproic acid. Continue aspiration precautions.  Acute respiratory failure: S/p trach collar.  Continue supportive care.   Oxygenation is 99% on 5 L per trach collar with FiO2 28%. PCCM following for trach needs.  Continue care per protocol  HFr EF(chronic systolic heart failure) 2D echo shows LVEF less than 20% at this time. Continue with carvedilol, isosorbide/hydralazine and spironolactone.  R LL pneumonia: Initially received a 7-day course of IV Fortaz. Tracheal aspirate was sent for culture and was significant for normal respiratory flora , concerning for aspiration.  Status post completion of 7-day course of Unasyn at this time.  Continue aspiration precautions. Currently stable and remains afebrile.  Continue suctioning  DVT /PE: Lower extremity duplex on 05/1321 showed age-indeterminate deep venous thrombosis involving left femoral vein, left proximal profunda vein, left posterior tibial veins, left popliteal vein, left peroneus veins. CT is scan of chest with  possible acute on chronic PE. Continue Eliquis at this time.  Dysphagia: Post PEG tube placement and on tube  feeding.  Fever > resolved. S/p antibiotic course.  Remains afebrile. At high risk for aspiration.  Continue to monitor  Essential hypertension: Continue amlodipine, carvedilol, hydralazine, isosorbide and spironolactone.  Hypernatremia: Resolved with free water flushes.  Myoclonus: Evaluated by neurology.  Continue Klonopin, Keppra. Per neuro note suppressing his myoclonus will not ultimately change the prognosis.  Pressure injury of the skin: Lateral penis, posterior right heel, right foot, not present on admission.  Pressure Injury 05/25/21 Heel Posterior;Right Unstageable - Full thickness tissue loss in which the base of the injury is covered by slough (yellow, tan, gray, green or brown) and/or eschar (tan, brown or black) in the wound bed. (Active)  05/25/21 0800  Location: Heel  Location Orientation: Posterior;Right  Staging: Unstageable - Full thickness tissue loss in which the base of the injury is covered by slough (yellow, tan, gray, green or brown) and/or eschar (tan, brown or black) in the wound bed.  Wound Description (Comments):   Present on Admission: No     DVT prophylaxis: Eliquis Code Status: Full code. Family Communication: No family at bed side. Disposition Plan:  Status is: Inpatient Remains inpatient appropriate because:  Awaiting safe disposition. Patient has significant needs, Home Care / Nursing arrangement, TOC / LLOS following   Consultants:  PCCM  Procedures: Tracheostomy, PEG tube Antimicrobials:  Fortaz Unasyn  Subjective: Patient was seen and examined at bedside.  No overnight events. Patient is non responsive, not interactive.,  Opens eyes on calling name.   Patient has a trach tube and PEG tube for feeding  Objective: Vitals:   07/29/21 0304 07/29/21 0451 07/29/21 0744 07/29/21 0923  BP:  (!) 173/128 (!) 131/109   Pulse:  98 93 93  Resp: 20 (!) 21 16 (!) 26  Temp:  99.1 F (37.3 C) 99.6 F (37.6 C)   TempSrc:  Oral Oral    SpO2:  99%  97%  Weight:  95 kg    Height:        Intake/Output Summary (Last 24 hours) at 07/29/2021 1038 Last data filed at 07/29/2021 0759 Gross per 24 hour  Intake 2275 ml  Output 600 ml  Net 1675 ml   Filed Weights   07/26/21 0356 07/27/21 0458 07/29/21 0451  Weight: 94 kg 95 kg 95 kg    Examination:  General exam: Appears comfortable, not in any acute distress. Respiratory system: CTA bilaterally, normal respiratory effort.  Trach in place Cardiovascular system: S1-S2 heard, regular rate and rhythm, no murmur. Gastrointestinal system: Abdomen is soft, mildly distended, non tender, PEG tube noted Central nervous system: Not responsive, not interactive, Extremities: No edema, no cyanosis, no clubbing Skin: No rashes, lesions or ulcers Psychiatry: Not assessed    Data Reviewed: I have personally reviewed following labs and imaging studies  CBC: Recent Labs  Lab 07/27/21 0425 07/29/21 0445  WBC 6.6 6.8  HGB 12.8* 12.5*  HCT 38.8* 39.3  MCV 93.3 94.2  PLT 313 0000000   Basic Metabolic Panel: Recent Labs  Lab 07/24/21 0233 07/27/21 0425 07/29/21 0445  NA 136 134* 138  K 4.9 4.7 4.6  CL 100 99 100  CO2 29 29 30   GLUCOSE 106* 99 118*  BUN 34* 36* 35*  CREATININE 0.73 0.63 0.69  CALCIUM 9.0 9.0 9.0  MG  --  2.0  --    GFR: Estimated Creatinine Clearance: 127.8 mL/min (by C-G  formula based on SCr of 0.69 mg/dL). Liver Function Tests: No results for input(s): AST, ALT, ALKPHOS, BILITOT, PROT, ALBUMIN in the last 168 hours. No results for input(s): LIPASE, AMYLASE in the last 168 hours. No results for input(s): AMMONIA in the last 168 hours. Coagulation Profile: No results for input(s): INR, PROTIME in the last 168 hours. Cardiac Enzymes: No results for input(s): CKTOTAL, CKMB, CKMBINDEX, TROPONINI in the last 168 hours. BNP (last 3 results) No results for input(s): PROBNP in the last 8760 hours. HbA1C: No results for input(s): HGBA1C in the last 72  hours. CBG: No results for input(s): GLUCAP in the last 168 hours. Lipid Profile: No results for input(s): CHOL, HDL, LDLCALC, TRIG, CHOLHDL, LDLDIRECT in the last 72 hours. Thyroid Function Tests: No results for input(s): TSH, T4TOTAL, FREET4, T3FREE, THYROIDAB in the last 72 hours. Anemia Panel: No results for input(s): VITAMINB12, FOLATE, FERRITIN, TIBC, IRON, RETICCTPCT in the last 72 hours. Sepsis Labs: No results for input(s): PROCALCITON, LATICACIDVEN in the last 168 hours.  No results found for this or any previous visit (from the past 240 hour(s)).   Radiology Studies: No results found.  Scheduled Meds:  apixaban  5 mg Per Tube BID   atropine  2 drop Sublingual QID   carvedilol  25 mg Per Tube BID   chlorhexidine  15 mL Mouth/Throat BID   clonazepam  0.5 mg Per Tube TID   doxazosin  2 mg Per Tube Daily   feeding supplement (OSMOLITE 1.5 CAL)  355 mL Per Tube QID   feeding supplement (PROSource TF)  45 mL Per Tube BID   fiber  1 packet Per Tube BID   free water  200 mL Per Tube Q6H   Gerhardt's butt cream   Topical Daily   glycopyrrolate  1 mg Per Tube TID   hydrALAZINE  50 mg Per Tube Q8H   isosorbide dinitrate  30 mg Per Tube TID   levETIRAcetam  750 mg Per Tube BID   mouth rinse  15 mL Mouth Rinse q12n4p   nutrition supplement (JUVEN)  1 packet Per Tube BID BM   pantoprazole sodium  40 mg Per Tube QHS   scopolamine  1 patch Transdermal Q72H   spironolactone  25 mg Per Tube Daily   valproic acid  750 mg Per Tube TID   Continuous Infusions:   LOS: 121 days    Time spent: 35 mins    Levii Hairfield, MD [facility]   If 7PM-7AM, please contact night-coverage

## 2021-07-30 NOTE — Progress Notes (Signed)
PROGRESS NOTE    Billy Cherry  [id number] DOB: February 12, 1971 DOA: 03/30/2021  PCP: Patient, No Pcp Per (Inactive)   Brief Narrative:  This 51 years old male with PMH significant of hypertension was brought into the hospital after being found unresponsive by his wife.  Patient was initially admitted to [facility] hospital and was noted to have out of hospital cardiac arrest and ventricular fibrillation with respiratory arrest suspected from angioedema from ACE inhibitor leading to asphyxiation.   Sequence of events so far. 1/19 left heart cath without significant CAD.  Concern for anoxic brain injury 1/21: MRI shows areas of hypoxic-ischemic injury bilaterally. Fevered; cultures obtained. 1/23 severe brain damage 1/25: PEG . Neuro exam unchanged.  Does exhibit new eyelid tremor like movements and lip/jaw tremor like vs clonus movement.  Bolused Keppra and increased maintenance dose.  Repeat EEG. CTH: concerning for progression of anoxic injury.  1/26: Repeat EEG unchanged and continues to show myoclonic seizures despite addition of Valproic acid yesterday.  03/30/2021 Transfer to [name] from [place] for continuous EEG 1/28: Continues to have myoclonic sz on EEG 2/3 no meaningful neuro recovery. Diuresed 2/6 New fever to 102, WBC 16K - treated with vancomycin for 7d based on cultures.  2/11: percutaneous tracheostomy 2/13 >2/20 on trach collar. No vent.  2/20 Trach change to 6 cuffless 2/28: fever 3/13: eliquis started 4/3: fever 06/06/2021 CT of the chest with new PE and haziness consistent with pneumonia 4/5-4/9: responding well to PRN lasix 4/11 fever again after completion of abx- work up in progress 4/13 continues to have low-grade temps cultures from 4/11 no growth to date-suspect silent/occult aspiration-heparin changed back to Eliquis 4/13 4/14: Work-up negative with no growth to date but red menance Tmax elevated 99.4 and respiratory rate up-x-ray did not show any pneumonia but  probably silently aspirating  Assessment & Plan:   Principal Problem:   Cardiac arrest ([place]) Active Problems:   Anoxic encephalopathy ([place])   Acute respiratory failure ([name])   RLL pneumonia   Dysphagia   DVT (deep venous thrombosis) ([name])   Myoclonus   Hypernatremia   HFrEF (heart failure with reduced ejection fraction) ([name])   HTN (hypertension)   Pressure injury of skin   Pulmonary embolus ([name])   Fever  Cardiac arrest, systolic heart failure: Status post cardiac arrest.  MRI shows hypoxic ischemic brain injury. 2D echo showed LVEF less than 20% with global hypokinesis. Currently compensated. Continue hydralazine, isosorbide, spironolactone . Very poor prognosis.  Anoxic encephalopathy: Status post tracheostomy and PEG tube placement.   Continue supportive care. Poor prognosis.  Patient is not interactive. Continue Clonazepam, Keppra and valproic acid. Continue aspiration precautions.  Acute respiratory failure: S/p trach collar.  Continue supportive care.   Oxygenation is 99% on 5 L per trach collar with FiO2 28%. PCCM following for trach needs.  Continue care per protocol  HFr EF(chronic systolic heart failure) 2D echo shows LVEF less than 20% at this time. Continue with carvedilol, isosorbide/hydralazine and spironolactone.  R LL pneumonia: Initially received a 7-day course of IV Fortaz. Tracheal aspirate was sent for culture and was significant for normal respiratory flora , concerning for aspiration.   Status post completion of 7-day course of Unasyn at this time.  Continue aspiration precautions. Currently stable and remains afebrile.  Continue suctioning  DVT /PE: Lower extremity duplex on 05/1321 showed age-indeterminate deep venous thrombosis involving left femoral vein, left proximal profunda vein, left posterior tibial veins, left popliteal vein, left peroneus veins. CT scan  of chest with possible acute on chronic PE. Continue Eliquis at this  time.  Dysphagia: Post PEG tube placement and on tube feeding.  Fever > resolved. S/p antibiotic course.  Remains afebrile. At high risk for aspiration.  Continue to monitor  Essential hypertension: Continue amlodipine, carvedilol, hydralazine, isosorbide and spironolactone.  Hypernatremia: Resolved with free water flushes.  Myoclonus: Evaluated by neurology.  Continue Klonopin, Keppra. Per neuro note suppressing his myoclonus will not ultimately change the prognosis.  Pressure injury of the skin: Lateral penis, posterior right heel, right foot, not present on admission.  Pressure Injury 05/25/21 Heel Posterior;Right Unstageable - Full thickness tissue loss in which the base of the injury is covered by slough (yellow, tan, gray, green or brown) and/or eschar (tan, brown or black) in the wound bed. (Active)  05/25/21 0800  Location: Heel  Location Orientation: Posterior;Right  Staging: Unstageable - Full thickness tissue loss in which the base of the injury is covered by slough (yellow, tan, gray, green or brown) and/or eschar (tan, brown or black) in the wound bed.  Wound Description (Comments):   Present on Admission: No     DVT prophylaxis: Eliquis Code Status: Full code. Family Communication: No family at bed side. Disposition Plan:  Status is: Inpatient Remains inpatient appropriate because:  Awaiting safe disposition. Patient has significant needs, Home Care / Nursing arrangement, TOC / LLOS following   Consultants:  PCCM  Procedures: Tracheostomy, PEG tube Antimicrobials:  Fortaz Unasyn  Subjective: Patient was seen and examined at bedside.  No overnight events. Patient is non responsive, not interactive.  Open eyes with normal blinking, appears comfortable. Patient has a trach tube and PEG tube for feeding.  Objective: Vitals:   07/30/21 0723 07/30/21 0833 07/30/21 1104 07/30/21 1115  BP: (!) 130/98  102/72   Pulse: 94 93 98 100  Resp: (!) 24 (!) 28  (!) 28 (!) 28  Temp: 99.6 F (37.6 C)     TempSrc: Oral     SpO2: 94% 96% 96% 95%  Weight:      Height:        Intake/Output Summary (Last 24 hours) at 07/30/2021 1215 Last data filed at 07/30/2021 1136 Gross per 24 hour  Intake 700 ml  Output 1500 ml  Net -800 ml   Filed Weights   07/27/21 0458 07/29/21 0451 07/30/21 0406  Weight: 95 kg 95 kg 97 kg    Examination:  General exam: Appears comfortable, not in any acute distress.  Chronically ill looking. Respiratory system: CTA bilaterally, normal respiratory effort, trach in place. Cardiovascular system: S1-S2 heard, regular rate and rhythm, no murmur. Gastrointestinal system: Abdomen is soft, mildly distended, non tender, PEG tube noted Central nervous system: Not responsive, not interactive, eyes open with normal blinking. Extremities: No edema, no cyanosis, no clubbing Skin: No rashes, lesions or ulcers Psychiatry: Not assessed    Data Reviewed: I have personally reviewed following labs and imaging studies  CBC: Recent Labs  Lab 07/27/21 0425 07/29/21 0445  WBC 6.6 6.8  HGB 12.8* 12.5*  HCT 38.8* 39.3  MCV 93.3 94.2  PLT 313 0000000   Basic Metabolic Panel: Recent Labs  Lab 07/24/21 0233 07/27/21 0425 07/29/21 0445  NA 136 134* 138  K 4.9 4.7 4.6  CL 100 99 100  CO2 29 29 30   GLUCOSE 106* 99 118*  BUN 34* 36* 35*  CREATININE 0.73 0.63 0.69  CALCIUM 9.0 9.0 9.0  MG  --  2.0  --  GFR: Estimated Creatinine Clearance: 129.1 mL/min (by C-G formula based on SCr of 0.69 mg/dL). Liver Function Tests: No results for input(s): AST, ALT, ALKPHOS, BILITOT, PROT, ALBUMIN in the last 168 hours. No results for input(s): LIPASE, AMYLASE in the last 168 hours. No results for input(s): AMMONIA in the last 168 hours. Coagulation Profile: No results for input(s): INR, PROTIME in the last 168 hours. Cardiac Enzymes: No results for input(s): CKTOTAL, CKMB, CKMBINDEX, TROPONINI in the last 168 hours. BNP (last 3  results) No results for input(s): PROBNP in the last 8760 hours. HbA1C: No results for input(s): HGBA1C in the last 72 hours. CBG: No results for input(s): GLUCAP in the last 168 hours. Lipid Profile: No results for input(s): CHOL, HDL, LDLCALC, TRIG, CHOLHDL, LDLDIRECT in the last 72 hours. Thyroid Function Tests: No results for input(s): TSH, T4TOTAL, FREET4, T3FREE, THYROIDAB in the last 72 hours. Anemia Panel: No results for input(s): VITAMINB12, FOLATE, FERRITIN, TIBC, IRON, RETICCTPCT in the last 72 hours. Sepsis Labs: No results for input(s): PROCALCITON, LATICACIDVEN in the last 168 hours.  No results found for this or any previous visit (from the past 240 hour(s)).   Radiology Studies: No results found.  Scheduled Meds:  apixaban  5 mg Per Tube BID   atropine  2 drop Sublingual QID   carvedilol  25 mg Per Tube BID   chlorhexidine  15 mL Mouth/Throat BID   clonazepam  0.5 mg Per Tube TID   doxazosin  2 mg Per Tube Daily   feeding supplement (OSMOLITE 1.5 CAL)  355 mL Per Tube QID   feeding supplement (PROSource TF)  45 mL Per Tube BID   fiber  1 packet Per Tube BID   free water  200 mL Per Tube Q6H   Gerhardt's butt cream   Topical Daily   glycopyrrolate  1 mg Per Tube TID   hydrALAZINE  50 mg Per Tube Q8H   isosorbide dinitrate  30 mg Per Tube TID   levETIRAcetam  750 mg Per Tube BID   mouth rinse  15 mL Mouth Rinse q12n4p   nutrition supplement (JUVEN)  1 packet Per Tube BID BM   pantoprazole sodium  40 mg Per Tube QHS   scopolamine  1 patch Transdermal Q72H   spironolactone  25 mg Per Tube Daily   valproic acid  750 mg Per Tube TID   Continuous Infusions:   LOS: 122 days    Time spent: 35 mins    Yurani Fettes, MD [facility]   If 7PM-7AM, please contact night-coverage

## 2021-07-31 NOTE — Progress Notes (Signed)
PROGRESS NOTE    Billy Cherry  [id number] DOB: 1970/06/07 DOA: 03/30/2021  PCP: Billy Cherry, No Pcp Per (Inactive)   Brief Narrative:  This 51 years old male with PMH significant of hypertension was brought into the hospital after being found unresponsive by his wife.  Billy Cherry was initially admitted to [facility] hospital and was noted to have out of hospital cardiac arrest and ventricular fibrillation with respiratory arrest suspected from angioedema from ACE inhibitor leading to asphyxiation.   Sequence of events so far. 1/19 left heart cath without significant CAD.  Concern for anoxic brain injury 1/21: MRI shows areas of hypoxic-ischemic injury bilaterally. Fevered; cultures obtained. 1/23 severe brain damage 1/25: PEG . Neuro exam unchanged.  Does exhibit new eyelid tremor like movements and lip/jaw tremor like vs clonus movement.  Bolused Keppra and increased maintenance dose.  Repeat EEG. CTH: concerning for progression of anoxic injury.  1/26: Repeat EEG unchanged and continues to show myoclonic seizures despite addition of Valproic acid yesterday.  03/30/2021 Transfer to [name] from [facility] for continuous EEG 1/28: Continues to have myoclonic sz on EEG 2/3 no meaningful neuro recovery. Diuresed 2/6 New fever to 102, WBC 16K - treated with vancomycin for 7d based on cultures.  2/11: percutaneous tracheostomy 2/13 >2/20 on trach collar. No vent.  2/20 Trach change to 6 cuffless 2/28: fever 3/13: eliquis started 4/3: fever 06/06/2021 CT of the chest with new PE and haziness consistent with pneumonia 4/5-4/9: responding well to PRN lasix 4/11 fever again after completion of abx- work up in progress 4/13 continues to have low-grade temps cultures from 4/11 no growth to date-suspect silent/occult aspiration-heparin changed back to Eliquis 4/13 4/14: Work-up negative with no growth to date but red menance Tmax elevated 99.4 and respiratory rate up-x-ray did not show any pneumonia but  probably silently aspirating  Assessment & Plan:   Principal Problem:   Cardiac arrest ([place]) Active Problems:   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  MRI shows hypoxic ischemic brain injury. 2D echo showed LVEF less than 20% with global hypokinesis. Currently compensated. Continue hydralazine, isosorbide, spironolactone . Very poor prognosis.  Anoxic encephalopathy: Status post tracheostomy and PEG tube placement.   Continue supportive care. Poor prognosis.  Billy Cherry is not interactive. Continue Clonazepam, Keppra and valproic acid. Continue aspiration precautions.  Acute respiratory failure: S/p trach collar.  Continue supportive care.   Oxygenation is 99% on 5 L per trach collar with FiO2 28%. PCCM following for trach needs.  Continue care per protocol  HFr EF(chronic systolic heart failure) 2D echo shows LVEF less than 20% at this time. Continue with carvedilol, isosorbide/hydralazine and spironolactone.  R LL pneumonia: Initially received a 7-day course of IV Fortaz. Tracheal aspirate was sent for culture and was significant for normal respiratory flora , concerning for aspiration.   Status post completion of 7-day course of Unasyn at this time.  Continue aspiration precautions. Currently stable and remains afebrile.  Continue suctioning  DVT /PE: Lower extremity duplex on 05/1321 showed age-indeterminate deep venous thrombosis involving left femoral vein, left proximal profunda vein, left posterior tibial veins, left popliteal vein, left peroneus veins. CT scan of chest  with possible acute on chronic PE. Continue Eliquis at this time.  Dysphagia: Post PEG  tube placement and on tube feeding.  Fever > resolved. S/p antibiotic course.  Remains afebrile. At high risk for aspiration.  Continue to monitor  Essential hypertension: Continue amlodipine, carvedilol, hydralazine, isosorbide and spironolactone.  Hypernatremia: Resolved with free water flushes.  Myoclonus: Evaluated by neurology.  Continue Klonopin, Keppra. Per neuro note suppressing his myoclonus will not ultimately change the prognosis.  Pressure injury of the skin: Lateral penis, posterior right heel, right foot, not present on admission.  Pressure Injury 05/25/21 Heel Posterior;Right Unstageable - Full thickness tissue loss in which the base of the injury is covered by slough (yellow, tan, gray, green or brown) and/or eschar (tan, brown or black) in the wound bed. (Active)  05/25/21 0800  Location: Heel  Location Orientation: Posterior;Right  Staging: Unstageable - Full thickness tissue loss in which the base of the injury is covered by slough (yellow, tan, gray, green or brown) and/or eschar (tan, brown or black) in the wound bed.  Wound Description (Comments):   Present on Admission: No     DVT prophylaxis: Eliquis Code Status: Full code. Family Communication: No family at bed side. Disposition Plan:  Status is: Inpatient Remains inpatient appropriate because:  Awaiting safe disposition. Billy Cherry has significant needs, Home Care / Nursing arrangement, TOC / LLOS following   Consultants:  PCCM  Procedures: Tracheostomy, PEG tube Antimicrobials:  Fortaz Unasyn  Subjective: Billy Cherry was seen and examined at bedside.  No overnight events. Billy Cherry is not interactive, opens eyes with normal blinking, appears comfortable. Billy Cherry has a trach tube and a PEG tube for feeding.  Objective: Vitals:   07/31/21 0342 07/31/21 0400 07/31/21 0730 07/31/21 0850  BP:  (!) 143/100  (!) 161/115  Pulse: 89 94  95  Resp: (!) 37 (!) 31  (!) 28  Temp:  99.1 F (37.3 C) 99.4 F  (37.4 C)   TempSrc:  Oral Oral   SpO2: 99% 100%  100%  Weight:  95 kg    Height:        Intake/Output Summary (Last 24 hours) at 07/31/2021 1041 Last data filed at 07/31/2021 0435 Gross per 24 hour  Intake 600 ml  Output 1375 ml  Net -775 ml   Filed Weights   07/29/21 0451 07/30/21 0406 07/31/21 0400  Weight: 95 kg 97 kg 95 kg    Examination:  General exam: Appears comfortable, not in any acute distress, chronically ill looking. Respiratory system: CTA bilaterally, normal respiratory effort, trach in place. Cardiovascular system: S1-S2 heard, regular rate and rhythm, no murmur. Gastrointestinal system: Abdomen is soft, mildly distended, nontender, PEG tube noted. Central nervous system: Not interactive, not responsive, open eyes with normal blinking. Extremities: No edema, no cyanosis, no clubbing Skin: No rashes, lesions or ulcers Psychiatry: Not assessed    Data Reviewed: I have personally reviewed following labs and imaging studies  CBC: Recent Labs  Lab 07/27/21 0425 07/29/21 0445  WBC 6.6 6.8  HGB 12.8* 12.5*  HCT 38.8* 39.3  MCV 93.3 94.2  PLT 313 324   Basic Metabolic Panel: Recent Labs  Lab 07/27/21 0425 07/29/21 0445  NA 134* 138  K 4.7 4.6  CL 99 100  CO2 29 30  GLUCOSE 99 118*  BUN 36* 35*  CREATININE 0.63 0.69  CALCIUM 9.0 9.0  MG 2.0  --    GFR: Estimated Creatinine Clearance: 127.8 mL/min (by C-G formula based on SCr of 0.69 mg/dL).  Liver Function Tests: No results for input(s): AST, ALT, ALKPHOS, BILITOT, PROT, ALBUMIN in the last 168 hours. No results for input(s): LIPASE, AMYLASE in the last 168 hours. No results for input(s): AMMONIA in the last 168 hours. Coagulation Profile: No results for input(s): INR, PROTIME in the last 168 hours. Cardiac Enzymes: No results for input(s): CKTOTAL, CKMB, CKMBINDEX, TROPONINI in the last 168 hours. BNP (last 3 results) No results for input(s): PROBNP in the last 8760 hours. HbA1C: No  results for input(s): HGBA1C in the last 72 hours. CBG: No results for input(s): GLUCAP in the last 168 hours. Lipid Profile: No results for input(s): CHOL, HDL, LDLCALC, TRIG, CHOLHDL, LDLDIRECT in the last 72 hours. Thyroid Function Tests: No results for input(s): TSH, T4TOTAL, FREET4, T3FREE, THYROIDAB in the last 72 hours. Anemia Panel: No results for input(s): VITAMINB12, FOLATE, FERRITIN, TIBC, IRON, RETICCTPCT in the last 72 hours. Sepsis Labs: No results for input(s): PROCALCITON, LATICACIDVEN in the last 168 hours.  No results found for this or any previous visit (from the past 240 hour(s)).   Radiology Studies: No results found.  Scheduled Meds:  apixaban  5 mg Per Tube BID   atropine  2 drop Sublingual QID   carvedilol  25 mg Per Tube BID   chlorhexidine  15 mL Mouth/Throat BID   clonazepam  0.5 mg Per Tube TID   doxazosin  2 mg Per Tube Daily   feeding supplement (OSMOLITE 1.5 CAL)  355 mL Per Tube QID   feeding supplement (PROSource TF)  45 mL Per Tube BID   fiber  1 packet Per Tube BID   free water  200 mL Per Tube Q6H   Gerhardt's butt cream   Topical Daily   glycopyrrolate  1 mg Per Tube TID   hydrALAZINE  50 mg Per Tube Q8H   isosorbide dinitrate  30 mg Per Tube TID   levETIRAcetam  750 mg Per Tube BID   mouth rinse  15 mL Mouth Rinse q12n4p   nutrition supplement (JUVEN)  1 packet Per Tube BID BM   pantoprazole sodium  40 mg Per Tube QHS   scopolamine  1 patch Transdermal Q72H   spironolactone  25 mg Per Tube Daily   valproic acid  750 mg Per Tube TID   Continuous Infusions:   LOS: 123 days    Time spent: 35 mins    Sacheen Arrasmith, MD [facility]   If 7PM-7AM, please contact night-coverage

## 2021-07-31 NOTE — Consult Note (Signed)
WOC Nurse wound follow up Patient receiving care in [facility] 3E22. Wound type: unstageable PI to right heel Measurement: 3 cm x 2 cm  Wound bed: thick, black, hard eschar easily removed. Revealed a pink wound that just needs to re-epithelialize. Drainage (amount, consistency, odor) none Periwound: intact Dressing procedure/placement/frequency: Continue current POC.  Helmut Muster, RN, MSN, CWOCN, CNS-BC, pager [phone]

## 2021-08-01 MED ORDER — PROSOURCE TF PO LIQD
45.0000 mL | Freq: Three times a day (TID) | ORAL | Status: DC
  Administered 2021-08-01 – 2021-09-01 (×90): 45 mL
  Filled 2021-08-01 (×89): qty 45

## 2021-08-01 NOTE — NC FL2 (Signed)
[place] LEVEL OF CARE SCREENING TOOL     IDENTIFICATION  Patient Name: Billy Cherry Birthdate: 02/06/1971 Sex: male Admission Date (Current Location): 03/30/2021  [place] and [place] Number:  Kathleen Argue [id number] [place] and Address:  [facility] [place] [address] [place], [place] [address]      Provider Number: [id number]  Attending Physician Name and Address:  Shawna Clamp, MD  Relative Name and Phone Number:       Current Level of Care: Hospital Recommended Level of Care: [place] Prior Approval Number:    Date Approved/Denied: 08/01/21 PASRR Number: [id number]  Discharge Plan: SNF    Current Diagnoses: Patient Active Problem List   Diagnosis Date Noted   Fever 06/13/2021   Pulmonary embolus ([place]) 06/06/2021   Pressure injury of skin 05/25/2021   DVT (deep venous thrombosis) ([place]) 05/16/2021   Dysphagia 05/01/2021   HTN (hypertension) 04/24/2021   Sepsis ([place]) 04/20/2021   RLL pneumonia 04/19/2021   HFrEF (heart failure with reduced ejection fraction) ([place]) 04/05/2021   Hypernatremia 04/04/2021   Anoxic encephalopathy ([place]) 04/03/2021   Myoclonus 03/30/2021   Acute respiratory failure ([place])    Cardiac arrest ([place]) 03/23/2021    Orientation RESPIRATION BLADDER Height & Weight      (Unable to assess)  Tracheostomy Incontinent, External catheter Weight: 205 lb 0.4 oz (93 kg) Height:   (bed scale not working)  BEHAVIORAL SYMPTOMS/MOOD NEUROLOGICAL BOWEL NUTRITION STATUS      Incontinent Feeding tube  AMBULATORY STATUS COMMUNICATION OF NEEDS Skin   Total Care Does not communicate PU Stage and Appropriate Care                       Personal Care Assistance Level of Assistance  Total care, Bathing, Dressing, Feeding Bathing Assistance: Maximum assistance Feeding assistance: Maximum assistance Dressing Assistance: Maximum assistance Total Care Assistance: Maximum assistance   Functional  Limitations Info  Sight, Hearing, Speech Sight Info: Adequate Hearing Info: Adequate Speech Info: Impaired    SPECIAL CARE FACTORS FREQUENCY  PT (By licensed PT), OT (By licensed OT)     PT Frequency: 3x weekly OT Frequency: 3x weekly            Contractures Contractures Info: Not present    Additional Factors Info  Code Status, Allergies, Psychotropic Code Status Info: Full Allergies Info: Lisinopril Psychotropic Info: Klonapin         Current Medications (08/01/2021):  This is the current hospital active medication list Current Facility-Administered Medications  Medication Dose Route Frequency Provider Last Rate Last Admin   acetaminophen (TYLENOL) tablet 650 mg  650 mg Per Tube Q6H PRN Kipp Brood, MD   650 mg at 07/24/21 0016   apixaban (ELIQUIS) tablet 5 mg  5 mg Per Tube BID Nita Sells, MD   5 mg at 08/01/21 1007   atropine 1 % ophthalmic solution 2 drop  2 drop Sublingual QID Swayze, Ava, DO   2 drop at 08/01/21 1009   carvedilol (COREG) tablet 25 mg  25 mg Per Tube BID Kipp Brood, MD   25 mg at 08/01/21 1007   chlorhexidine (PERIDEX) 0.12 % solution 15 mL  15 mL Mouth/Throat BID Pokhrel, Laxman, MD   15 mL at 08/01/21 1007   clonazePAM (KLONOPIN) disintegrating tablet 0.5 mg  0.5 mg Per Tube TID Kipp Brood, MD   0.5 mg at 08/01/21 1008   doxazosin (CARDURA) tablet 2 mg  2 mg Per Tube Daily  Einar Grad, RPH   2 mg at 08/01/21 1008   feeding supplement (OSMOLITE 1.5 CAL) liquid 355 mL  355 mL Per Tube QID Pokhrel, Laxman, MD 0 mL/hr at 07/12/21 0700 355 mL at 08/01/21 1009   feeding supplement (PROSource TF) liquid 45 mL  45 mL Per Tube TID Shawna Clamp, MD       fiber (NUTRISOURCE FIBER) 1 packet  1 packet Per Tube BID Kipp Brood, MD   1 packet at 08/01/21 1007   free water 200 mL  200 mL Per Tube Q6H Vann, Jessica U, DO   200 mL at 08/01/21 1202   Gerhardt's butt cream   Topical Daily Agarwala, Einar Grad, MD   Given at 08/01/21 1009    glycopyrrolate (ROBINUL) tablet 1 mg  1 mg Per Tube TID Vance Gather B, MD   1 mg at 08/01/21 1008   hydrALAZINE (APRESOLINE) injection 10 mg  10 mg Intravenous Q6H PRN Vernelle Emerald, MD   10 mg at 07/13/21 0031   hydrALAZINE (APRESOLINE) tablet 50 mg  50 mg Per Tube Q8H Vance Gather B, MD   50 mg at 08/01/21 0535   isosorbide dinitrate (ISORDIL) tablet 30 mg  30 mg Per Tube TID Arrien, Jimmy Picket, MD   30 mg at 08/01/21 1008   levETIRAcetam (KEPPRA) 100 MG/ML solution 750 mg  750 mg Per Tube BID Tawni Millers, MD   750 mg at 08/01/21 1008   lip balm (CARMEX) ointment 1 application  1 application. Topical PRN Etta Quill, DO   1 application. at 06/19/21 2045   loperamide HCl (IMODIUM) 1 MG/7.5ML suspension 2 mg  2 mg Per Tube PRN Kipp Brood, MD   2 mg at 07/08/21 0147   MEDLINE mouth rinse  15 mL Mouth Rinse q12n4p Kc, Ramesh, MD   15 mL at 08/01/21 1202   nutrition supplement (JUVEN) (JUVEN) powder packet 1 packet  1 packet Per Tube BID BM Kathie Dike, MD   1 packet at 08/01/21 0826   ondansetron (ZOFRAN) injection 4 mg  4 mg Intravenous Q6H PRN Domenic Polite, MD   4 mg at 07/26/21 2354   pantoprazole sodium (PROTONIX) 40 mg/20 mL oral suspension 40 mg  40 mg Per Tube QHS Kipp Brood, MD   40 mg at 07/31/21 2305   scopolamine (TRANSDERM-SCOP) 1 MG/3DAYS 1.5 mg  1 patch Transdermal Q72H Swayze, Ava, DO   1.5 mg at 08/01/21 1007   spironolactone (ALDACTONE) tablet 25 mg  25 mg Per Tube Daily Wouk, Ailene Rud, MD   25 mg at 08/01/21 1007   valproic acid (DEPAKENE) 250 MG/5ML solution 750 mg  750 mg Per Tube TID Debbe Odea, MD   750 mg at 08/01/21 1007     Discharge Medications: Please see discharge summary for a list of discharge medications.  Relevant Imaging Results:  Relevant Lab Results:   Additional Information SSN: [id number]  Archie Endo, LCSW

## 2021-08-01 NOTE — Progress Notes (Signed)
NAME:  Billy Cherry, MRN:  [id number], DOB:  [birth year], LOS: 124 ADMISSION DATE:  03/30/2021, CONSULTATION DATE:  1/19 REFERRING MD:  Evette Georges, CHIEF COMPLAINT:  Found down, cardiac arrest   Brief Pt Description / Synopsis:  51 year old male with out-of-hospital V. fib cardiac arrest in setting of respiratory arrest due to suspected angioedema from ACE inhibitor leading to asphyxiation.  Concern on his 12 lead EKG for anterolateral ST elevation so he was taken to the cath lab where his study showed no significant coronary artery disease and an LVEF < 20%  Now with anoxic brain injury.  Pertinent  Medical History  Hypertension  Significant Hospital Events: Including procedures, antibiotic start and stop dates in addition to other pertinent events   1/19 admission, left heart cath without significant CAD.  Concern for anoxic brain injury 1/20: MRI pending, Neuro following 1/21: MRI shows areas of hypoxic-ischemic injury bilaterally. Fevered; cultures obtained. 1/23 severe brain damage 1/24 evidence of severe brain damage, remains critically ill 1/25: Plan for PEG today. Neuro exam unchanged.  Does exhibit new eyelid tremor like movements and lip/jaw tremor like vs clonus movement.  Bolused Keppra and increased maintenance dose.  Repeat EEG. CTH: concerning for progression of anoxic injury.  1/26: Repeat EEG unchanged and continues to show myoclonic seizures despite addition of Valproic acid yesterday. transfer to [facility] for continuous EEG 1/28: Continues to have myoclonic sz on EEG 1/29 No acute issues overnight 2/3 no meaningful neuro recovery. Diuresed 2/5 unchanged 2/6 New fever to 102, WBC 16K - treated with vancomycin for 7d based on cultures.  2/11: percutaneous tracheostomy 2/13 >2/20 on trach collar. No vent.  2/20 Trach change to 6 cuffless 2/27 status unchanged 3/13 unchanged 3/20 due for trach exchange given continue secretions (although improved) will stick to 6 cuffless  for exchange  06/05/2021 Continued thick  secretions, scant blood tinged 4/3/am 06/06/2021 CT of the chest with new PE and haziness consistent with pneumonia 5/30 continue tube feeding/free water  Interim History / Subjective:  No changes, unable to answer questions. May be discharged home soon.  Objective   Blood pressure 118/89, pulse 93, temperature 98.9 F (37.2 C), temperature source Oral, resp. rate 19, height 5\' 10"  (1.778 m), weight 93 kg, SpO2 98 %.    FiO2 (%):  [28 %] 28 %   Intake/Output Summary (Last 24 hours) at 08/01/2021 1748 Last data filed at 08/01/2021 0442 Gross per 24 hour  Intake --  Output 1100 ml  Net -1100 ml    Filed Weights   07/31/21 0400 08/01/21 0000 08/01/21 0500  Weight: 95 kg 93 kg 93 kg   Examination:  General: chronically ill appearing man lying in bed in NAD HEENT: [place]/AT, eyes anicteric Neck: trach in place, shiley #6 uncuffed  Resp: mild rhonchi on R, CTA on left, no accessory muscle use Cardiac: S1S2, RRR Neuro: no significant eye opening, no response to verbal stimulation or during exam Abd: PEG  without erythema  Labs reviewed. Mild azotemia and anemia persist. No recent imaging or cultures.  Ancillary tests personally reviewed:     Assessment & Plan:  Out of hospital V-fib arrest Postarrest anoxic brain injury Myoclonus Respiratory failure with prolonged mechanical ventilation Tracheostomy dependent Cardiomyopathy AKI Pulmonary embolism Hypertension  -Continue trach care -Cuffed 6 Shiley in place, needed for secretion management -Continue Eliquis Poor neuro prognosis  Discharge planning issues remain  PCCM will follow weekly  08/03/21, MD 08/01/21 5:48 PM [facility]

## 2021-08-01 NOTE — Progress Notes (Signed)
Nutrition Follow-up  DOCUMENTATION CODES:   Not applicable  INTERVENTION:  Continue tube feeding via PEG:  1.5 cartons of Osmolite 1.5 - QID (355 mL each bolus) Increase 45 mL ProSource TF - TID 300 mL free water q4h  Provides 2250 kcal, 122 gm PRO, and 1086 mL free water (2879 mL total free water) daily.    Continue Juven (or equivalent) BID per tube, each packet provides 80 calories, 8 grams of carbohydrate, 2.5  grams of protein to promote wound healing  NUTRITION DIAGNOSIS:   Inadequate oral intake related to inability to eat as evidenced by NPO status; ongoing  GOAL:   Patient will meet greater than or equal to 90% of their needs; met with TF  MONITOR:   Labs, Weight trends, TF tolerance, Skin, I & O's  REASON FOR ASSESSMENT:   Ventilator, Consult Enteral/tube feeding initiation and management  ASSESSMENT:   51 year old male who originally presented to [facility] on 1/19 after out-of-hospital cardiac arrest due to suspected angioedema from ACE inhibitor leading to asphyxiation. PMH of HTN, HLD.   2/11 - tracheostomy placed 2/22 - PEG placed   Per TOC note on 5/25, plan to discharge home no later than Thursday.   No changes since RD visit last week. Spoke with RN and increased protein modular to TID to help with wounds prior to discharge.   Medications reviewed and include: Fiber, Protonix, Spironolactone Labs reviewed.  Diet Order:  NPO Diet Order     None      EDUCATION NEEDS:   Not appropriate for education at this time  Skin:  Skin Assessment: Reviewed RN Assessment Skin Integrity Issues:: Unstageable DTI: N/A Stage II: R foot Stage III: N/A Unstageable: R heel Other: MASD Buttocks  Last BM:  5/21  Height:  Ht Readings from Last 1 Encounters:  04/18/21 _0  (1.778 m)   Weight:  Wt Readings from Last 1 Encounters:  08/01/21 93 kg   Ideal Body Weight:  75.5 kg  BMI:  Body mass index is 29.42 kg/m.  Estimated Nutritional Needs:  Kcal:   2200-2400 Protein:  110-125 grams Fluid:  >/= 2.2 L   Hermina Barters RD, LDN Clinical Dietitian See [facility] for contact information.

## 2021-08-01 NOTE — TOC Progression Note (Addendum)
Transition of Care [facility]) - Progression Note    Patient Details  Name: Billy Cherry MRN: [id number] Date of Birth: 1970-09-23  Transition of Care [facility]) CM/SW Contact  Janae Bridgeman, RN Phone Number: 08/01/2021, 12:07 PM  Clinical Narrative:    CM called and spoke with Mason Jim, Admissions CM with [facility] and [employer] is currently recruiting for private duty nursing at the home.  [facility] will need to restart insurance authorization once staffing has been established at this time.    I called and briefly spoke with the patient's wife, [name] and asked for a return call to discuss patient's pending discharge to home at a later date once private duty nursing can be arranged by the home health company. The patient's wife has declined SNF placement with [facility] in the past but I would like to discuss this option with her once again, considering provision of home health services may be limited due to the nursing shortage in the community setting.    08/01/2021 1400 - CM called and spoke with the patient's wife on the phone and she was made aware that [employer] is continuing to recruit for home private duty nursing care.  I spoke with the patient's wife explained that the patient may need an alternative discharge plan and the patient's wife was agreeable to fax the patient out in the hub for SNF placement.  The patient's wife states that her choice is to have the patient return to home but she is open to discussing SNf placement at an acceptable SNF facility as a second option at this point.  Claudette Laws, LCSW is aware and will complete the FL2 and fax out int he hub to discuss with the wife once bed offers are available.  CM and MSW with [facility] will continue to follow the patient for discharge planning to home.   Expected Discharge Plan: Home w Home Health Services Barriers to Discharge: Other (must enter comment) (Patient's wife - requested private duty  services through alternative provider for 12 hours shifts for staffing at the home.)  Expected Discharge Plan and Services Expected Discharge Plan: Home w Home Health Services In-house Referral: Clinical Social Work Discharge Planning Services: CM Consult, Follow-up appt scheduled Post Acute Care Choice: Home Health Living arrangements for the past 2 months: Single Family Home                 DME Arranged: Trach supplies, Tube feeding, Suction, Hospital bed, Oxygen DME Agency: [employer]       HH Arranged: RN (Private Duty Nursing through [employer] versus [place]) HH Agency: [employer] Services Date HH Agency Contacted: 07/04/21 Time HH Agency Contacted: 1000 Representative spoke with at [facility] Agency: Jeanmarie Plant, Director with [employer] [phone]   Social Determinants of Health (SDOH) Interventions    Readmission Risk Interventions    04/14/2021   11:14 AM  Readmission Risk Prevention Plan  Transportation Screening Complete  HRI or Home Care Consult Complete  Social Work Consult for Recovery Care Planning/Counseling Complete  Palliative Care Screening Complete  Medication Review [employer]) Complete

## 2021-08-01 NOTE — Progress Notes (Signed)
PROGRESS NOTE    Billy Cherry  [id number] DOB: 04-Jun-1970 DOA: 03/30/2021  PCP: Patient, No Pcp Per (Inactive)   Brief Narrative:  This 51 years old male with PMH significant of hypertension was brought into the hospital after being found unresponsive by his wife.  Patient was initially admitted to [facility] hospital and was noted to have out of hospital cardiac arrest and ventricular fibrillation with respiratory arrest suspected from angioedema from ACE inhibitor leading to asphyxiation.   Sequence of events so far. 1/19 left heart cath without significant CAD.  Concern for anoxic brain injury 1/21: MRI shows areas of hypoxic-ischemic injury bilaterally. Fevered; cultures obtained. 1/23 severe brain damage 1/25: PEG . Neuro exam unchanged.  Does exhibit new eyelid tremor like movements and lip/jaw tremor like vs clonus movement.  Bolused Keppra and increased maintenance dose.  Repeat EEG. CTH: concerning for progression of anoxic injury.  1/26: Repeat EEG unchanged and continues to show myoclonic seizures despite addition of Valproic acid yesterday.  03/30/2021 Transfer to [name] from [place] for continuous EEG 1/28: Continues to have myoclonic sz on EEG 2/3 no meaningful neuro recovery. Diuresed 2/6 New fever to 102, WBC 16K - treated with vancomycin for 7d based on cultures.  2/11: percutaneous tracheostomy 2/13 >2/20 on trach collar. No vent.  2/20 Trach change to 6 cuffless 2/28: fever 3/13: eliquis started 4/3: fever 06/06/2021 CT of the chest with new PE and haziness consistent with pneumonia 4/5-4/9: responding well to PRN lasix 4/11 fever again after completion of abx- work up in progress 4/13 continues to have low-grade temps cultures from 4/11 no growth to date-suspect silent/occult aspiration-heparin changed back to Eliquis 4/13 4/14: Work-up negative with no growth to date but red menance Tmax elevated 99.4 and respiratory rate up-x-ray did not show any pneumonia but  probably silently aspirating  Assessment & Plan:   Principal Problem:   Cardiac arrest ([place]) Active Problems:   Anoxic encephalopathy ([place])   Acute respiratory failure ([name])   RLL pneumonia   Dysphagia   DVT (deep venous thrombosis) ([name])   Myoclonus   Hypernatremia   HFrEF (heart failure with reduced ejection fraction) ([name])   HTN (hypertension)   Pressure injury of skin   Pulmonary embolus ([name])   Fever  S/P Cardiac arrest: Status post cardiac arrest.  MRI shows hypoxic ischemic brain injury. 2D echo showed LVEF less than 20% with global hypokinesis. Currently compensated. Continue hydralazine, isosorbide, spironolactone . Very poor prognosis.  Anoxic encephalopathy: Status post tracheostomy and PEG tube placement.   Continue supportive care. Poor prognosis.  Patient is not interactive. Continue Clonazepam, Keppra and valproic acid. Continue aspiration precautions.  Acute respiratory failure: S/p trach collar.  Continue supportive care.   Oxygenation is 99% on 5 L per trach collar with FiO2 28%. PCCM following for trach needs.  Continue care per protocol  HFr EF(chronic systolic heart failure) 2D echo shows LVEF less than 20% at this time. Continue with carvedilol, isosorbide/hydralazine and spironolactone.  R LL pneumonia: Initially received a 7-day course of IV Fortaz. Tracheal aspirate was sent for culture and was significant for normal respiratory flora , concerning for aspiration.   Status post completion of 7-day course of Unasyn at this time.  Continue aspiration precautions. Currently stable and remains afebrile.  Continue suctioning  DVT /PE: Lower extremity duplex on 05/1321 showed age-indeterminate deep venous thrombosis involving left femoral vein, left proximal profunda vein, left posterior tibial veins, left popliteal vein, left peroneus veins. CT scan of chest  with possible acute on chronic PE. Continue Eliquis at this time.  Dysphagia: Post PEG  tube placement and on tube feeding.  Fever > resolved. S/p antibiotic course.  Remains afebrile. At high risk for aspiration.  Continue to monitor  Essential hypertension: Continue amlodipine, carvedilol, hydralazine, isosorbide and spironolactone.  Hypernatremia: Resolved with free water flushes.  Myoclonus: Evaluated by neurology.  Continue Klonopin, Keppra. Per neuro note suppressing his myoclonus will not ultimately change the prognosis.  Pressure injury of the skin: Lateral penis, posterior right heel, right foot, not present on admission.  Pressure Injury 05/25/21 Heel Posterior;Right Unstageable - Full thickness tissue loss in which the base of the injury is covered by slough (yellow, tan, gray, green or brown) and/or eschar (tan, brown or black) in the wound bed. (Active)  05/25/21 0800  Location: Heel  Location Orientation: Posterior;Right  Staging: Unstageable - Full thickness tissue loss in which the base of the injury is covered by slough (yellow, tan, gray, green or brown) and/or eschar (tan, brown or black) in the wound bed.  Wound Description (Comments):   Present on Admission: No     DVT prophylaxis: Eliquis Code Status: Full code. Family Communication: No family at bed side. Disposition Plan:  Status is: Inpatient Remains inpatient appropriate because:  Awaiting safe disposition. Patient has significant needs, Home Care / Nursing arrangement, TOC / LLOS following   Consultants:  PCCM  Procedures: Tracheostomy, PEG tube Antimicrobials:  Fortaz Unasyn  Subjective: Patient was seen and examined at bedside.  No overnight events. Patient is not interactive, no change from yesterday.  Appears comfortable. Patient has a trach tube, PEG tube for feeding.  Objective: Vitals:   08/01/21 0500 08/01/21 0728 08/01/21 0825 08/01/21 1007  BP:  (!) 164/121  (!) 136/107  Pulse:  91 93 91  Resp:  (!) 22 (!) 24   Temp:  99.3 F (37.4 C)    TempSrc:  Oral     SpO2:  100% 99%   Weight: 93 kg     Height:        Intake/Output Summary (Last 24 hours) at 08/01/2021 1030 Last data filed at 08/01/2021 0442 Gross per 24 hour  Intake --  Output 1400 ml  Net -1400 ml   Filed Weights   07/31/21 0400 08/01/21 0000 08/01/21 0500  Weight: 95 kg 93 kg 93 kg    Examination:  General exam: Appears comfortable, chronically ill looking, not in any acute distress. Respiratory system: CTA bilaterally, respiratory effort normal, trach in place. Cardiovascular system: S1-S2 heard, regular rate and rhythm, no murmur. Gastrointestinal system: Abdomen is soft, mildly distended, non tender, PEG noted Central nervous system: Not interactive, not responsive, open eyes with normal blinking. Extremities: No edema, no cyanosis, no clubbing Skin: No rashes, lesions or ulcers Psychiatry: Not assessed    Data Reviewed: I have personally reviewed following labs and imaging studies  CBC: Recent Labs  Lab 07/27/21 0425 07/29/21 0445  WBC 6.6 6.8  HGB 12.8* 12.5*  HCT 38.8* 39.3  MCV 93.3 94.2  PLT 313 0000000   Basic Metabolic Panel: Recent Labs  Lab 07/27/21 0425 07/29/21 0445  NA 134* 138  K 4.7 4.6  CL 99 100  CO2 29 30  GLUCOSE 99 118*  BUN 36* 35*  CREATININE 0.63 0.69  CALCIUM 9.0 9.0  MG 2.0  --    GFR: Estimated Creatinine Clearance: 126.6 mL/min (by C-G formula based on SCr of 0.69 mg/dL). Liver Function Tests: No results for input(s):  AST, ALT, ALKPHOS, BILITOT, PROT, ALBUMIN in the last 168 hours. No results for input(s): LIPASE, AMYLASE in the last 168 hours. No results for input(s): AMMONIA in the last 168 hours. Coagulation Profile: No results for input(s): INR, PROTIME in the last 168 hours. Cardiac Enzymes: No results for input(s): CKTOTAL, CKMB, CKMBINDEX, TROPONINI in the last 168 hours. BNP (last 3 results) No results for input(s): PROBNP in the last 8760 hours. HbA1C: No results for input(s): HGBA1C in the last 72  hours. CBG: No results for input(s): GLUCAP in the last 168 hours. Lipid Profile: No results for input(s): CHOL, HDL, LDLCALC, TRIG, CHOLHDL, LDLDIRECT in the last 72 hours. Thyroid Function Tests: No results for input(s): TSH, T4TOTAL, FREET4, T3FREE, THYROIDAB in the last 72 hours. Anemia Panel: No results for input(s): VITAMINB12, FOLATE, FERRITIN, TIBC, IRON, RETICCTPCT in the last 72 hours. Sepsis Labs: No results for input(s): PROCALCITON, LATICACIDVEN in the last 168 hours.  No results found for this or any previous visit (from the past 240 hour(s)).   Radiology Studies: No results found.  Scheduled Meds:  apixaban  5 mg Per Tube BID   atropine  2 drop Sublingual QID   carvedilol  25 mg Per Tube BID   chlorhexidine  15 mL Mouth/Throat BID   clonazepam  0.5 mg Per Tube TID   doxazosin  2 mg Per Tube Daily   feeding supplement (OSMOLITE 1.5 CAL)  355 mL Per Tube QID   feeding supplement (PROSource TF)  45 mL Per Tube BID   fiber  1 packet Per Tube BID   free water  200 mL Per Tube Q6H   Gerhardt's butt cream   Topical Daily   glycopyrrolate  1 mg Per Tube TID   hydrALAZINE  50 mg Per Tube Q8H   isosorbide dinitrate  30 mg Per Tube TID   levETIRAcetam  750 mg Per Tube BID   mouth rinse  15 mL Mouth Rinse q12n4p   nutrition supplement (JUVEN)  1 packet Per Tube BID BM   pantoprazole sodium  40 mg Per Tube QHS   scopolamine  1 patch Transdermal Q72H   spironolactone  25 mg Per Tube Daily   valproic acid  750 mg Per Tube TID   Continuous Infusions:   LOS: 124 days    Time spent: 25 mins    Shawna Clamp, MD [facility]   If 7PM-7AM, please contact night-coverage

## 2021-08-01 NOTE — Progress Notes (Incomplete)
NAME:  Gracin Mcpartland, MRN:  [id number], DOB:  [birth year], LOS: 69 ADMISSION DATE:  03/30/2021, CONSULTATION DATE:  *** REFERRING MD:  ***, CHIEF COMPLAINT:  ***   History of Present Illness:  ***  Pertinent  Medical History  ***  Significant Hospital Events: Including procedures, antibiotic start and stop dates in addition to other pertinent events     Interim History / Subjective:  ***  Objective   Blood pressure (!) 146/106, pulse 91, temperature 99 F (37.2 C), temperature source Oral, resp. rate 18, height $RemoveBe'5\' 10"'eZahCMLEe$  (1.778 m), weight 93 kg, SpO2 99 %.    FiO2 (%):  [28 %] 28 %   Intake/Output Summary (Last 24 hours) at 08/01/2021 1534 Last data filed at 08/01/2021 0442 Gross per 24 hour  Intake --  Output 1100 ml  Net -1100 ml   Filed Weights   07/31/21 0400 08/01/21 0000 08/01/21 0500  Weight: 95 kg 93 kg 93 kg    Examination: General: *** HENT: *** Lungs: *** Cardiovascular: *** Abdomen: *** Extremities: *** Neuro: *** GU: ***  Resolved Hospital Problem list   ***  Assessment & Plan:  ***  Best Practice (right click and "Reselect all SmartList Selections" daily)   Diet/type: {diet type:25684} DVT prophylaxis: {anticoagulation (Optional):25687} GI prophylaxis: {OI:78676} Lines: {Central Venous Access:25771} Foley:  {Central Venous Access:25691} Code Status:  {Code Status:26939} Last date of multidisciplinary goals of care discussion [***]  Labs   CBC: Recent Labs  Lab 07/27/21 0425 07/29/21 0445  WBC 6.6 6.8  HGB 12.8* 12.5*  HCT 38.8* 39.3  MCV 93.3 94.2  PLT 313 720    Basic Metabolic Panel: Recent Labs  Lab 07/27/21 0425 07/29/21 0445  NA 134* 138  K 4.7 4.6  CL 99 100  CO2 29 30  GLUCOSE 99 118*  BUN 36* 35*  CREATININE 0.63 0.69  CALCIUM 9.0 9.0  MG 2.0  --    GFR: Estimated Creatinine Clearance: 126.6 mL/min (by C-G formula based on SCr of 0.69 mg/dL). Recent Labs  Lab 07/27/21 0425 07/29/21 0445  WBC 6.6 6.8     Liver Function Tests: No results for input(s): AST, ALT, ALKPHOS, BILITOT, PROT, ALBUMIN in the last 168 hours. No results for input(s): LIPASE, AMYLASE in the last 168 hours. No results for input(s): AMMONIA in the last 168 hours.  ABG    Component Value Date/Time   PHART 7.43 03/24/2021 0500   PCO2ART 36 03/24/2021 0500   PO2ART 111 (H) 03/24/2021 0500   HCO3 23.9 03/24/2021 0500   ACIDBASEDEF 0.3 03/23/2021 0556   O2SAT 98.5 03/24/2021 0500     Coagulation Profile: No results for input(s): INR, PROTIME in the last 168 hours.  Cardiac Enzymes: No results for input(s): CKTOTAL, CKMB, CKMBINDEX, TROPONINI in the last 168 hours.  HbA1C: Hgb A1c MFr Bld  Date/Time Value Ref Range Status  03/23/2021 06:23 AM 5.4 4.8 - 5.6 % Final    Comment:    (NOTE) Pre diabetes:          5.7%-6.4%  Diabetes:              >6.4%  Glycemic control for   <7.0% adults with diabetes   03/23/2021 02:54 AM 5.5 4.8 - 5.6 % Final    Comment:    DUPLICATE AT 9470 96/28/3662. THE NEXT SAMPLE WAS CREDITED. (NOTE) Pre diabetes:          5.7%-6.4%  Diabetes:              >  6.4%  Glycemic control for   <7.0% adults with diabetes     CBG: No results for input(s): GLUCAP in the last 168 hours.  Review of Systems:   ***  Past Medical History:  He,  has a past medical history of Hypertension.   Surgical History:   Past Surgical History:  Procedure Laterality Date   BACK SURGERY     CORONARY/GRAFT ACUTE MI REVASCULARIZATION N/A 03/23/2021   Procedure: Coronary/Graft Acute MI Revascularization;  Surgeon: Isaias Cowman, MD;  Location: [place] [facility];  Service: Cardiovascular;  Laterality: N/A;   IR GASTROSTOMY TUBE MOD SED  04/26/2021   LEFT HEART CATH AND CORONARY ANGIOGRAPHY N/A 03/23/2021   Procedure: LEFT HEART CATH AND CORONARY ANGIOGRAPHY;  Surgeon: Isaias Cowman, MD;  Location: [place] [facility];  Service: Cardiovascular;  Laterality: N/A;   SHOULDER SURGERY        Social History:   reports that he has never smoked. He has never used smokeless tobacco. He reports current alcohol use. He reports that he does not use drugs.   Family History:  His family history is not on file.   Allergies Allergies  Allergen Reactions   Lisinopril Swelling    angioedema     Home Medications  Prior to Admission medications   Medication Sig Start Date End Date Taking? Authorizing Provider  omeprazole (PRILOSEC) 40 MG capsule Place contents of 1 capsule (40 mg total) into feeding tube daily. 06/28/21  Yes Domenic Polite, MD  acetaminophen (TYLENOL) 325 MG tablet Place 2 tablets (650 mg total) into feeding tube every 6 (six) hours as needed for mild pain or fever. 03/30/21   Bradly Bienenstock, NP  apixaban (ELIQUIS) 5 MG TABS tablet Place 1 tablet (5 mg total) into feeding tube 2 (two) times daily. 06/28/21   Domenic Polite, MD  carvedilol (COREG) 25 MG tablet Place 1 tablet (25 mg total) into feeding tube 2 (two) times daily with a meal. 06/28/21   Domenic Polite, MD  Chlorhexidine Gluconate Cloth 2 % PADS Apply 6 each topically daily at 6 (six) AM. 03/31/21   Bradly Bienenstock, NP  chlorhexidine gluconate, MEDLINE KIT, (PERIDEX) 0.12 % solution 15 mLs by Mouth Rinse route 2 (two) times daily. 03/30/21   Bradly Bienenstock, NP  clevidipine (CLEVIPREX) 0.5 MG/ML EMUL Inject 0-21 mg/hr into the vein continuous. 03/30/21   Bradly Bienenstock, NP  clonazePAM (KLONOPIN) 0.5 MG tablet Place 1 tablet (0.5 mg total) into feeding tube 2 (two) times daily. 06/28/21   Domenic Polite, MD  dextrose 5 % solution Inject 75 mL/hr into the vein continuous. 03/30/21   Bradly Bienenstock, NP  docusate (COLACE) 50 MG/5ML liquid Place 10 mLs (100 mg total) into feeding tube 2 (two) times daily as needed for mild constipation. 03/30/21   Bradly Bienenstock, NP  doxazosin (CARDURA) 2 MG tablet Place 1 tablet (2 mg total) into feeding tube daily. 06/29/21   Domenic Polite, MD  enoxaparin  (LOVENOX) 60 MG/0.6ML injection Inject 0.55 mLs (55 mg total) into the skin daily. 03/30/21   Bradly Bienenstock, NP  fiber (NUTRISOURCE FIBER) PACK packet Place 1 packet into feeding tube 2 (two) times daily. 06/28/21   Domenic Polite, MD  hydrALAZINE (APRESOLINE) 20 MG/ML injection Inject 0.5-1 mLs (10-20 mg total) into the vein every 4 (four) hours as needed (SBP>180 DBP>105). 03/30/21   Bradly Bienenstock, NP  hydrALAZINE (APRESOLINE) 25 MG tablet Place 1 tablet (25 mg total) into feeding tube  3 (three) times daily. 06/28/21   Domenic Polite, MD  HYDROmorphone (DILAUDID) 1 MG/ML injection Inject 1 mL (1 mg total) into the vein every 30 (thirty) minutes as needed (to achieve RASS & CPOT goal.). 03/30/21   Bradly Bienenstock, NP  HYDROmorphone (DILAUDID) 1 MG/ML injection Inject 1-3 mLs (1-3 mg total) into the vein every hour as needed (to achieve RASS & CPOT goal.). 03/30/21   Bradly Bienenstock, NP  insulin aspart (NOVOLOG) 100 UNIT/ML injection Inject 0-15 Units into the skin every 4 (four) hours. 03/30/21   Bradly Bienenstock, NP  isosorbide dinitrate (ISORDIL) 20 MG tablet Place 1 tablet (20 mg total) into feeding tube 3 (three) times daily. 06/28/21   Domenic Polite, MD  labetalol (NORMODYNE) 5 MG/ML injection Inject 2 mLs (10 mg total) into the vein every 2 (two) hours as needed (Give second line if persistant hypertesion >160 (hold for HR <60)). 03/30/21   Bradly Bienenstock, NP  levETIRAcetam (KEPPRA) 100 MG/ML solution Place 7.5 mLs (750 mg total) into feeding tube 2 (two) times daily. 06/28/21   Domenic Polite, MD  levETIRAcetam 750 mg in sodium chloride 0.9 % 100 mL Inject 750 mg into the vein every 12 (twelve) hours. 03/30/21   Bradly Bienenstock, NP  Loperamide HCl 1 MG/7.5ML LIQD Place 15 mLs (2 mg total) into feeding tube daily as needed for diarrhea or loose stools. 06/28/21   Domenic Polite, MD  Mouthwashes (MOUTH RINSE) LIQD solution 15 mLs by Mouth Rinse route every 4 (four) hours. 03/30/21    Bradly Bienenstock, NP  Nutritional Supplements (FEEDING SUPPLEMENT, PROSOURCE TF,) liquid Place 45 mLs into feeding tube 2 (two) times daily. 03/30/21   Bradly Bienenstock, NP  Nutritional Supplements (FEEDING SUPPLEMENT, VITAL HIGH PROTEIN,) LIQD liquid Place 1,000 mLs into feeding tube continuous. 03/30/21   Bradly Bienenstock, NP  ondansetron (ZOFRAN) 4 MG/2ML SOLN injection Inject 2 mLs (4 mg total) into the vein every 6 (six) hours as needed for nausea. 03/30/21   Bradly Bienenstock, NP  polyethylene glycol powder (GLYCOLAX/MIRALAX) 17 GM/SCOOP powder Place 1 capful  (17 g) into feeding tube daily as needed for mild constipation or moderate constipation. 06/28/21   Domenic Polite, MD  propofol (DIPRIVAN) 1000 MG/100ML EMUL injection Inject [id number] mcg/min into the vein continuous. 03/30/21   Bradly Bienenstock, NP  scopolamine (TRANSDERM-SCOP) 1 MG/3DAYS Place 1 patch (1.5 mg total) onto the skin every 3 (three) days. 06/29/21   Domenic Polite, MD  sodium chloride 0.9 % infusion Inject 250 mLs into the vein as needed (for IV line care  (Saline / Heparin Lock)). 03/30/21   Darel Hong D, NP  sodium chloride 0.9 % infusion Inject 250 mLs into the vein continuous. 03/30/21   Darel Hong D, NP  sodium chloride flush (NS) 0.9 % SOLN Inject 3 mLs into the vein every 12 (twelve) hours. 03/30/21   Darel Hong D, NP  sodium chloride flush (NS) 0.9 % SOLN Inject 3 mLs into the vein as needed. 03/30/21   Bradly Bienenstock, NP  spironolactone (ALDACTONE) 25 MG tablet Place 1 tablet (25 mg total) into feeding tube daily. 06/29/21   Domenic Polite, MD  valproic acid (DEPAKENE) 250 MG/5ML solution Place 10 mLs (500 mg total) into feeding tube 3 (three) times daily. 03/30/21   Bradly Bienenstock, NP  valproic acid (DEPAKENE) 250 MG/5ML solution Place 15 mLs (750 mg total) into feeding tube 3 (three) times daily. 06/28/21   Domenic Polite,  MD  Water For Irrigation, Sterile (FREE WATER) SOLN Place 300 mLs into  feeding tube every 4 (four) hours. 06/28/21   Domenic Polite, MD     Critical care time: ***

## 2021-08-01 NOTE — Progress Notes (Signed)
CSW completed FL2 and faxed patient's clinicals out for review.  Liridona Mashaw, MSW, LCSW Transitions of Care  Clinical Social Worker II [phone]  

## 2021-08-02 LAB — CBC
HCT: 39.5 % (ref 39.0–52.0)
Hemoglobin: 13 g/dL (ref 13.0–17.0)
MCH: 30.7 pg (ref 26.0–34.0)
MCHC: 32.9 g/dL (ref 30.0–36.0)
MCV: 93.2 fL (ref 80.0–100.0)
Platelets: 271 10*3/uL (ref 150–400)
RBC: 4.24 MIL/uL (ref 4.22–5.81)
RDW: 13.6 % (ref 11.5–15.5)
WBC: 6.2 10*3/uL (ref 4.0–10.5)
nRBC: 0 % (ref 0.0–0.2)

## 2021-08-02 LAB — BASIC METABOLIC PANEL
Anion gap: 6 (ref 5–15)
BUN: 39 mg/dL — ABNORMAL HIGH (ref 6–20)
CO2: 30 mmol/L (ref 22–32)
Calcium: 9 mg/dL (ref 8.9–10.3)
Chloride: 99 mmol/L (ref 98–111)
Creatinine, Ser: 0.67 mg/dL (ref 0.61–1.24)
GFR, Estimated: >60 mL/min (ref >60)
Glucose, Bld: 107 mg/dL — ABNORMAL HIGH (ref 70–99)
Potassium: 4.7 mmol/L (ref 3.5–5.1)
Sodium: 135 mmol/L (ref 135–145)

## 2021-08-02 LAB — PHOSPHORUS: Phosphorus: 3.4 mg/dL (ref 2.5–4.6)

## 2021-08-02 LAB — MAGNESIUM: Magnesium: 2.1 mg/dL (ref 1.7–2.4)

## 2021-08-02 NOTE — TOC Progression Note (Signed)
Transition of Care [facility]) - Progression Note    Patient Details  Name: Billy Cherry MRN: [id number] Date of Birth: [birth year]  Transition of Care [facility]) CM/SW Contact  Billy Bridgeman, RN Phone Number: 08/02/2021, 2:51 PM  Clinical Narrative:    CM called and spoke with Billy Cherry, wife and she was agreeable for placement at [facility] for SNF placement.  Insurance authorization has been started by the facility today and once authorization has been obtained, the patient will be transferred to the facility by ambulance transport.  The patient's wife would ultimately like the patient to be transferred home for care once [employer] has hired RN/LPN staff for the home to cover the care of the patient in the home for 84 hours of private duty nursing per week.  I called and spoke with Billy Cherry, Admissions coordinator with [facility] in [place], [place] and made him aware that the patient will be transferred to [facility] as soon as authorization is obtained.  Billy Cherry will continue to recruit nurses for private duty at this time so that the patient can eventually be sent home from the facility.  I also called and left a voicemail message with Billy Cherry, Business manager with [facility] private duty nursing as well.  Maxim plans to Animator with [employer].  CM and MSW with DTP Team will continue to follow the patient for SNF placement at [facility] once authorization has been obtained.  Expected Discharge Plan: Home w Home Health Services Barriers to Discharge: Other (must enter comment) (Patient's wife - requested private duty services through alternative provider for 12 hours shifts for staffing at the home.)  Expected Discharge Plan and Services Expected Discharge Plan: Home w Home Health Services In-house Referral: Clinical Social Work Discharge Planning Services: CM Consult, Follow-up appt scheduled Post Acute Care Choice: Home  Health Living arrangements for the past 2 months: Single Family Home                 DME Arranged: Trach supplies, Tube feeding, Suction, Hospital bed, Oxygen DME Agency: [employer]       HH Arranged: RN (Private Duty Nursing through [employer] versus [place]) HH Agency: [employer] Services Date HH Agency Contacted: 07/04/21 Time HH Agency Contacted: 1000 Representative spoke with at [facility] Agency: Billy Cherry, Director with [employer] [phone]   Social Determinants of Health (SDOH) Interventions    Readmission Risk Interventions    04/14/2021   11:14 AM  Readmission Risk Prevention Plan  Transportation Screening Complete  HRI or Home Care Consult Complete  Social Work Consult for Recovery Care Planning/Counseling Complete  Palliative Care Screening Complete  Medication Review Oceanographer) Complete

## 2021-08-02 NOTE — Progress Notes (Addendum)
10:40am: CSW spoke with Juliann Pulse at [facility] to request she review this patient. Clinicals sent via HUB for review.  9am: Patient's clinicals were sent to additional facilities for review as there are no bed offers available at this time.  Madilyn Fireman, MSW, LCSW Transitions of Care  Clinical Social Worker II [phone]

## 2021-08-02 NOTE — Progress Notes (Signed)
PROGRESS NOTE    Billy Cherry  [id number] DOB: 11/28/1970 DOA: 03/30/2021  PCP: Patient, No Pcp Per (Inactive)   Brief Narrative:  This 51 years old male with PMH significant of hypertension was brought into the hospital after being found unresponsive by his wife.  Patient was initially admitted to [facility] hospital and was noted to have out of hospital cardiac arrest and ventricular fibrillation with respiratory arrest suspected from angioedema from ACE inhibitor leading to asphyxiation.   Sequence of events so far. 1/19 left heart cath without significant CAD.  Concern for anoxic brain injury 1/21: MRI shows areas of hypoxic-ischemic injury bilaterally. Fevered; cultures obtained. 1/23 severe brain damage 1/25: PEG . Neuro exam unchanged.  Does exhibit new eyelid tremor like movements and lip/jaw tremor like vs clonus movement.  Bolused Keppra and increased maintenance dose.  Repeat EEG. CTH: concerning for progression of anoxic injury.  1/26: Repeat EEG unchanged and continues to show myoclonic seizures despite addition of Valproic acid yesterday.   03/30/2021 Transfer to [name] from [place] for continuous EEG 1/28: Continues to have myoclonic sz on EEG 2/3 no meaningful neuro recovery. Diuresed 2/6 New fever to 102, WBC 16K - treated with vancomycin for 7d based on cultures.  2/11: percutaneous tracheostomy 2/13 >2/20 on trach collar. No vent.  2/20 Trach change to 6 cuffless 3/13: eliquis started 4/3: fever 06/06/2021 CT of the chest with new PE and haziness consistent with pneumonia 4/5-4/9: responding well to PRN lasix 4/11 fever again after completion of abx- work up in progress 4/13 continues to have low-grade temps cultures from 4/11 no growth to date-suspect silent/occult aspiration-heparin changed back to Eliquis 4/13 4/14: Work-up negative with no growth to date but red menance Tmax elevated 99.4 and respiratory rate up-x-ray did not show any pneumonia but probably  silently aspirating  Assessment & Plan:   Principal Problem:   Cardiac arrest ([place]) Active Problems:   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 MRI shows hypoxic ischemic brain injury. 2D echo showed LVEF less than 20% with global hypokinesis. Currently compensated. Continue hydralazine, isosorbide, spironolactone . Very poor prognosis.  Anoxic encephalopathy: Status post tracheostomy and PEG tube placement.   Continue supportive care. Poor prognosis.  Patient is not interactive. Continue Clonazepam, Keppra and valproic acid. Continue aspiration precautions.  Acute respiratory failure: S/p trach collar.  Continue supportive care.   Oxygenation is 99% on 5 L per trach collar with FiO2 28%. PCCM following for trach needs.  Continue care per protocol  HFr EF(chronic systolic heart failure) 2D echo shows LVEF less than 20% at this time. Continue with carvedilol, isosorbide/hydralazine and spironolactone.  R LL pneumonia: Initially received a 7-day course of IV Fortaz. Tracheal aspirate was sent for culture and was significant for normal respiratory flora , concerning for aspiration.   Status post completion of 7-day course of Unasyn at this time.  Continue aspiration precautions. Currently stable and remains afebrile.  Continue suctioning  DVT /PE: Lower extremity duplex on 05/1321 showed age-indeterminate deep venous thrombosis involving left femoral vein, left proximal profunda vein, left posterior tibial veins, left popliteal vein, left peroneus veins. CT scan of chest with possible  acute on chronic PE. Continue Eliquis at this time.  Dysphagia: Post PEG tube  placement and on tube feeding.  Fever > resolved. S/p antibiotic course.  Remains afebrile. At high risk for aspiration.  Continue to monitor  Essential hypertension: Continue amlodipine, carvedilol, hydralazine, isosorbide and spironolactone.  Hypernatremia: Resolved with free water flushes.  Myoclonus: Evaluated by neurology.  Continue Klonopin, Keppra. Per neuro note suppressing his myoclonus will not ultimately change the prognosis.  Pressure injury of the skin: Lateral penis, posterior right heel, right foot, not present on admission.  Pressure Injury 05/25/21 Heel Posterior;Right Unstageable - Full thickness tissue loss in which the base of the injury is covered by slough (yellow, tan, gray, green or brown) and/or eschar (tan, brown or black) in the wound bed. (Active)  05/25/21 0800  Location: Heel  Location Orientation: Posterior;Right  Staging: Unstageable - Full thickness tissue loss in which the base of the injury is covered by slough (yellow, tan, gray, green or brown) and/or eschar (tan, brown or black) in the wound bed.  Wound Description (Comments):   Present on Admission: No     DVT prophylaxis: Eliquis Code Status: Full code. Family Communication: No family at bed side. Disposition Plan:  Status is: Inpatient Remains inpatient appropriate because:  Awaiting safe disposition. Patient has significant needs, Home Care / Nursing arrangement, TOC / LLOS following   Consultants:  PCCM  Procedures: Tracheostomy, PEG tube Antimicrobials:  Fortaz Unasyn  Subjective: Patient was seen and examined at bedside.  No overnight events. Patient is not interactive, no change from yesterday.  Appears comfortable. Patient has a lot of secretions, Marinol started yesterday. Patient has a trach tube, PEG tube for feeding.  Objective: Vitals:   08/02/21 0430 08/02/21 0500 08/02/21 0833 08/02/21 0915  BP:   (!) 156/119 (!) 152/117  Pulse:   87 86  Resp:   (!) 22 (!)  22  Temp:    98.9 F (37.2 C)  TempSrc:    Oral  SpO2: 96%  100% 97%  Weight:  94 kg    Height:        Intake/Output Summary (Last 24 hours) at 08/02/2021 1116 Last data filed at 08/02/2021 0414 Gross per 24 hour  Intake --  Output 800 ml  Net -800 ml   Filed Weights   08/01/21 0500 08/02/21 0002 08/02/21 0500  Weight: 93 kg 94 kg 94 kg    Examination:  General exam: Appears chronically ill looking, comfortable, not in any acute distress. Respiratory system: CTA bilaterally, respiratory effort normal, trach in place. Cardiovascular system: S1-S2 heard, regular rate and rhythm, no murmur. Gastrointestinal system: Abdomen is soft, mildly distended, non tender, PEG noted Central nervous system: Not interactive, not responsive, open eyes with normal blinking Extremities: No edema, no cyanosis, no clubbing Skin: No rashes, lesions or ulcers Psychiatry: Not assessed    Data Reviewed: I have personally reviewed following labs and imaging studies  CBC: Recent Labs  Lab 07/27/21 0425 07/29/21 0445 08/02/21 0317  WBC 6.6 6.8 6.2  HGB 12.8* 12.5* 13.0  HCT 38.8* 39.3 39.5  MCV 93.3 94.2 93.2  PLT 313 324 [id number]   Basic Metabolic Panel: Recent Labs  Lab 07/27/21 0425 07/29/21 0445 08/02/21 0317  NA 134* 138 135  K 4.7 4.6 4.7  CL 99 100 99  CO2 29 30 30   GLUCOSE 99 118* 107*  BUN 36* 35* 39*  CREATININE 0.63 0.69 0.67  CALCIUM 9.0 9.0 9.0  MG 2.0  --  2.1  PHOS  --   --  3.4   GFR: Estimated Creatinine Clearance: 127.2 mL/min (by C-G formula based on SCr of 0.67 mg/dL). Liver Function Tests: No results for input(s): AST, ALT, ALKPHOS, BILITOT, PROT, ALBUMIN in the last 168 hours. No results for input(s): LIPASE, AMYLASE in the last 168 hours. No results for input(s): AMMONIA in the last 168 hours. Coagulation Profile: No results for input(s): INR, PROTIME in the last 168 hours. Cardiac Enzymes: No results for input(s): CKTOTAL, CKMB, CKMBINDEX, TROPONINI in  the last 168 hours. BNP (last 3 results) No results for input(s): PROBNP in the last 8760 hours. HbA1C: No results for input(s): HGBA1C in the last 72 hours. CBG: No results for input(s): GLUCAP in the last 168 hours. Lipid Profile: No results for input(s): CHOL, HDL, LDLCALC, TRIG, CHOLHDL, LDLDIRECT in the last 72 hours. Thyroid Function Tests: No results for input(s): TSH, T4TOTAL, FREET4, T3FREE, THYROIDAB in the last 72 hours. Anemia Panel: No results for input(s): VITAMINB12, FOLATE, FERRITIN, TIBC, IRON, RETICCTPCT in the last 72 hours. Sepsis Labs: No results for input(s): PROCALCITON, LATICACIDVEN in the last 168 hours.  No results found for this or any previous visit (from the past 240 hour(s)).   Radiology Studies: No results found.  Scheduled Meds:  apixaban  5 mg Per Tube BID   atropine  2 drop Sublingual QID   carvedilol  25 mg Per Tube BID   chlorhexidine  15 mL Mouth/Throat BID   clonazepam  0.5 mg Per Tube TID   doxazosin  2 mg Per Tube Daily   feeding supplement (OSMOLITE 1.5 CAL)  355 mL Per Tube QID   feeding supplement (PROSource TF)  45 mL Per Tube TID   fiber  1 packet Per Tube BID   free water  200 mL Per Tube Q6H   Gerhardt's butt cream   Topical Daily   glycopyrrolate  1 mg Per Tube TID   hydrALAZINE  50 mg Per Tube Q8H   isosorbide dinitrate  30 mg Per Tube TID   levETIRAcetam  750 mg Per Tube BID   mouth rinse  15 mL Mouth Rinse q12n4p   nutrition supplement (JUVEN)  1 packet Per Tube BID BM   pantoprazole sodium  40 mg Per Tube QHS   scopolamine  1 patch Transdermal Q72H   spironolactone  25 mg Per Tube Daily   valproic acid  750 mg Per Tube TID   Continuous Infusions:   LOS: 125 days    Time spent: 25 mins    Shawna Clamp, MD [facility]   If 7PM-7AM, please contact night-coverage

## 2021-08-02 NOTE — TOC Progression Note (Addendum)
Transition of Care [facility]) - Progression Note    Patient Details  Name: Billy Cherry MRN: [id number] Date of Birth: 1971-01-27  Transition of Care [facility]) CM/SW Contact  Janae Bridgeman, RN Phone Number: 08/02/2021, 8:12 AM  Clinical Narrative:    CM called and spoke with Janey Greaser, RNCM with [facility] duty and they are unable to assist in providing 12 hour nursing support in the home at this time.  [facility] is continuing to recruit for private duty nursing in the home but they currently do not have staffing available to provide needed care in the home to support full-time hours of private duty nursing.  I called this morning and spoke with the patient's wife on the phone and received permission to reach out to an additional Private duty nursing company - [facility] - and clinicals were sent to the attention of [employer], owner to review clinicals and assist with provision of staffing if available.  I spoke with the patient's wife and updated her that the patient's clinicals were faxed out in the area as a back up plan for home but at this time - no bed offers are available.  CM and LCSW will continue to communicate with [employer] and [facility] [phone]) to seek out private duty nursing support - otherwise the patient may need to be discharged to an available SNF facility until private duty nursing is established.  The patient's wife continues to be hesitant about admission to a SNF facility but is willing to discuss options once private duty or bed offers at facilities are available.  08/02/2021 - 1132 - [facility] is reviewing the patient's clinicals for potential bed offer availability as backup plan to patient discharging home with private duty nursing.  Verdell Face, Business manager with [employer] [phone] also sent back secure email stating that they will check on private duty nursing availability and will reach out to the patient's wife  is able to provide nursing care in the home.  CM and MSW with DTP Team will continue to explore options for SNF placement versus home with private duty nursing - no bed offers nor private duty nursing available at this time.   Expected Discharge Plan: Home w Home Health Services Barriers to Discharge: Other (must enter comment) (Patient's wife - requested private duty services through alternative provider for 12 hours shifts for staffing at the home.)  Expected Discharge Plan and Services Expected Discharge Plan: Home w Home Health Services In-house Referral: Clinical Social Work Discharge Planning Services: CM Consult, Follow-up appt scheduled Post Acute Care Choice: Home Health Living arrangements for the past 2 months: Single Family Home                 DME Arranged: Trach supplies, Tube feeding, Suction, Hospital bed, Oxygen DME Agency: [employer]       HH Arranged: RN (Private Duty Nursing through [employer] versus [place]) HH Agency: [employer] Services Date HH Agency Contacted: 07/04/21 Time HH Agency Contacted: 1000 Representative spoke with at [facility] Agency: Jeanmarie Plant, Director with [employer] [phone]   Social Determinants of Health (SDOH) Interventions    Readmission Risk Interventions    04/14/2021   11:14 AM  Readmission Risk Prevention Plan  Transportation Screening Complete  HRI or Home Care Consult Complete  Social Work Consult for Recovery Care Planning/Counseling Complete  Palliative Care Screening Complete  Medication Review Oceanographer) Complete

## 2021-08-03 NOTE — Progress Notes (Signed)
PROGRESS NOTE    Billy Cherry  [id number] DOB: 25-May-1970 DOA: 03/30/2021  PCP: Patient, No Pcp Per (Inactive)   Brief Narrative:  Patient is a 51 year old male with past medical history significant for hypertension.  Patient was brought into the hospital after being found unresponsive by his wife.  Patient was initially admitted to [facility] hospital and was noted to have out of hospital cardiac arrest and ventricular fibrillation with respiratory arrest suspected from angioedema from ACE inhibitor leading to asphyxiation.   Sequence of events so far. 1/19 left heart cath without significant CAD.  Concern for anoxic brain injury 1/21: MRI shows areas of hypoxic-ischemic injury bilaterally. Febrile; cultures obtained. 1/23 severe brain damage 1/25: PEG . Neuro exam unchanged.  Does exhibit new eyelid tremor like movements and lip/jaw tremor like vs clonus movement.  Bolused Keppra and increased maintenance dose.  Repeat EEG. CTH: concerning for progression of anoxic injury.  1/26: Repeat EEG unchanged and continues to show myoclonic seizures despite addition of Valproic acid yesterday.   03/30/2021 Transfer to [name] from [place] for continuous EEG 1/28: Continues to have myoclonic sz on EEG 2/3 no meaningful neuro recovery. Diuresed 2/6 New fever to 102, WBC 16K - treated with vancomycin for 7d based on cultures.  2/11: percutaneous tracheostomy 2/13 >2/20 on trach collar. No vent.  2/20 Trach change to 6 cuffless 3/13: eliquis started 4/3: fever 06/06/2021 CT of the chest with new PE and haziness consistent with pneumonia 4/5-4/9: responding well to PRN lasix 4/11 fever again after completion of abx- work up in progress 4/13 continues to have low-grade temps cultures from 4/11 no growth to date-suspect silent/occult aspiration-heparin changed back to Eliquis 4/13 4/14: Work-up negative with no growth to date but red menance Tmax elevated 99.4 and respiratory rate up-x-ray did not  show any pneumonia but probably silently aspirating  08/03/2021: Patient seen.  Nursing staff is worried that patient is having significant residual.  Patient remains unresponsive.  Assessment & Plan:   Principal Problem:   Cardiac arrest ([place]) Active Problems:   Anoxic encephalopathy ([place])   Acute respiratory failure ([name])   RLL pneumonia   Dysphagia   DVT (deep venous thrombosis) ([name])   Myoclonus   Hypernatremia   HFrEF (heart failure with reduced ejection fraction) ([name])   HTN (hypertension)   Pressure injury of skin   Pulmonary embolus ([name])   Fever  S/P Cardiac arrest: Status post cardiac arrest. MRI shows hypoxic ischemic brain injury. 2D echo showed LVEF less than 20% with global hypokinesis. Currently compensated. Continue hydralazine, isosorbide, spironolactone . Very poor prognosis.  Anoxic encephalopathy: Status post tracheostomy and PEG tube placement.   Continue supportive care. Poor prognosis.  Patient is not interactive. Continue Clonazepam, Keppra and valproic acid. Continue aspiration precautions.  Acute respiratory failure: S/p trach collar.  Continue supportive care.   Oxygenation is 99% on 5 L per trach collar with FiO2 28%. PCCM following for trach needs.  Continue care per protocol  HFr EF(chronic systolic heart failure) 2D echo shows LVEF less than 20% at this time. Continue with carvedilol, isosorbide/hydralazine and spironolactone.  R LL pneumonia: Initially received a 7-day course of IV Fortaz. Tracheal aspirate was sent for culture and was significant for normal respiratory flora , concerning for aspiration.   Status post completion of 7-day course of Unasyn at this time.  Continue aspiration precautions. Currently stable and remains afebrile.  Continue suctioning  DVT /PE: Lower extremity duplex on 05/1321 showed age-indeterminate deep venous thrombosis involving  left femoral vein, left proximal profunda vein, left posterior tibial veins,  left popliteal vein, left peroneus veins. CT scan of chest with possible acute on chronic PE. Continue Eliquis at this time.  Dysphagia: Post PEG tube placement and on tube feeding.  Fever > resolved. S/p antibiotic course.  Remains afebrile. At high risk for aspiration.  Continue to monitor  Essential hypertension: Continue amlodipine, carvedilol, hydralazine, isosorbide and spironolactone.  Hypernatremia: Resolved with free water flushes.  Myoclonus: Evaluated by neurology.  Continue Klonopin, Keppra. Per neuro note suppressing his myoclonus will not ultimately change the prognosis.  Pressure injury of the skin: Lateral penis, posterior right heel, right foot, not present on admission.  Pressure Injury 05/25/21 Heel Posterior;Right Unstageable - Full thickness tissue loss in which the base of the injury is covered by slough (yellow, tan, gray, green or brown) and/or eschar (tan, brown or black) in the wound bed. (Active)  05/25/21 0800  Location: Heel  Location Orientation: Posterior;Right  Staging: Unstageable - Full thickness tissue loss in which the base of the injury is covered by slough (yellow, tan, gray, green or brown) and/or eschar (tan, brown or black) in the wound bed.  Wound Description (Comments):   Present on Admission: No     DVT prophylaxis: Eliquis Code Status: Full code. Family Communication: No family at bed side. Disposition Plan:  Status is: Inpatient Remains inpatient appropriate because:  Awaiting safe disposition. Patient has significant needs, Home Care / Nursing arrangement, TOC / LLOS following   Consultants:  PCCM  Procedures: Tracheostomy, PEG tube Antimicrobials:  Fortaz Unasyn  Subjective: Patient is unresponsive. Patient cannot give any history.  Objective: Vitals:   08/03/21 1022 08/03/21 1126 08/03/21 1502 08/03/21 1538  BP: (!) 142/106 (!) 142/106 (!) 134/95 (!) 134/95  Pulse: 86 99  96  Resp: 19 20  (!) 22  Temp:       TempSrc:      SpO2: 99% 99%  98%  Weight:      Height:        Intake/Output Summary (Last 24 hours) at 08/03/2021 1552 Last data filed at 08/03/2021 0732 Gross per 24 hour  Intake 1100 ml  Output 1400 ml  Net -300 ml    Filed Weights   08/02/21 0002 08/02/21 0500 08/03/21 0109  Weight: 94 kg 94 kg 94 kg    Examination:  General exam: Appears chronically ill looking, comfortable, not in any acute distress. Respiratory system: CTA bilaterally, respiratory effort normal, trach in place. Cardiovascular system: S1-S2 heard, regular rate and rhythm, no murmur. Gastrointestinal system: Abdomen is soft, mildly distended, non tender, PEG noted Central nervous system: Not interactive, not responsive, open eyes with normal blinking Extremities: No edema, no cyanosis, no clubbing Skin: No rashes, lesions or ulcers Psychiatry: Not assessed    Data Reviewed: I have personally reviewed following labs and imaging studies  CBC: Recent Labs  Lab 07/29/21 0445 08/02/21 0317  WBC 6.8 6.2  HGB 12.5* 13.0  HCT 39.3 39.5  MCV 94.2 93.2  PLT 324 [id number]    Basic Metabolic Panel: Recent Labs  Lab 07/29/21 0445 08/02/21 0317  NA 138 135  K 4.6 4.7  CL 100 99  CO2 30 30  GLUCOSE 118* 107*  BUN 35* 39*  CREATININE 0.69 0.67  CALCIUM 9.0 9.0  MG  --  2.1  PHOS  --  3.4    GFR: Estimated Creatinine Clearance: 127.2 mL/min (by C-G formula based on SCr of 0.67 mg/dL). Liver Function  Tests: No results for input(s): AST, ALT, ALKPHOS, BILITOT, PROT, ALBUMIN in the last 168 hours. No results for input(s): LIPASE, AMYLASE in the last 168 hours. No results for input(s): AMMONIA in the last 168 hours. Coagulation Profile: No results for input(s): INR, PROTIME in the last 168 hours. Cardiac Enzymes: No results for input(s): CKTOTAL, CKMB, CKMBINDEX, TROPONINI in the last 168 hours. BNP (last 3 results) No results for input(s): PROBNP in the last 8760 hours. HbA1C: No results for  input(s): HGBA1C in the last 72 hours. CBG: No results for input(s): GLUCAP in the last 168 hours. Lipid Profile: No results for input(s): CHOL, HDL, LDLCALC, TRIG, CHOLHDL, LDLDIRECT in the last 72 hours. Thyroid Function Tests: No results for input(s): TSH, T4TOTAL, FREET4, T3FREE, THYROIDAB in the last 72 hours. Anemia Panel: No results for input(s): VITAMINB12, FOLATE, FERRITIN, TIBC, IRON, RETICCTPCT in the last 72 hours. Sepsis Labs: No results for input(s): PROCALCITON, LATICACIDVEN in the last 168 hours.  No results found for this or any previous visit (from the past 240 hour(s)).   Radiology Studies: No results found.  Scheduled Meds:  apixaban  5 mg Per Tube BID   atropine  2 drop Sublingual QID   carvedilol  25 mg Per Tube BID   chlorhexidine  15 mL Mouth/Throat BID   clonazepam  0.5 mg Per Tube TID   doxazosin  2 mg Per Tube Daily   feeding supplement (OSMOLITE 1.5 CAL)  355 mL Per Tube QID   feeding supplement (PROSource TF)  45 mL Per Tube TID   fiber  1 packet Per Tube BID   free water  200 mL Per Tube Q6H   Gerhardt's butt cream   Topical Daily   glycopyrrolate  1 mg Per Tube TID   hydrALAZINE  50 mg Per Tube Q8H   isosorbide dinitrate  30 mg Per Tube TID   levETIRAcetam  750 mg Per Tube BID   mouth rinse  15 mL Mouth Rinse q12n4p   nutrition supplement (JUVEN)  1 packet Per Tube BID BM   pantoprazole sodium  40 mg Per Tube QHS   scopolamine  1 patch Transdermal Q72H   spironolactone  25 mg Per Tube Daily   valproic acid  750 mg Per Tube TID   Continuous Infusions:   LOS: 126 days    Time spent: 25 mins    Bonnell Public, MD [facility]   If 7PM-7AM, please contact night-coverage

## 2021-08-03 NOTE — Progress Notes (Signed)
Patient seen today by trach team for consult.  No education is needed at this time.  All necessary equipment is at beside.   Will continue to follow for progression.  

## 2021-08-04 NOTE — Evaluation (Signed)
Physical Therapy Evaluation Patient Details Name: Billy Cherry MRN: [id number] DOB: [birth year] Today's Date: 08/04/2021  History of Present Illness  51 y.o. male who presented 03/23/21 as a code STEMI after being found unresponsive. V. fib cardiac arrest in setting of respiratory arrest due to suspected angioedema from ACE inhibitor leading to asphyxiation.  Concern on his 12 lead EKG for anterolateral ST elevation so he was taken to the cath lab where his study showed no significant coronary artery disease and an LVEF < 20%. 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 S/p cortak placement 1/27, trach placement 2/11. ETT 1/19 - 2/11. PMH: HTN  Clinical Impression  Patient with eyes open throughout session seems to respond to stimuli in variety of ways with yawning, chewing and grimacing with L shoulder PROM.  He was able to blink to threat with delay of ~3 seconds.  Noting some decreased ROM in UE's especially PIP and MCP joints, with forearm supination and thumb abduction.  He was moving his head spontaneously to no specific stimulus.  Feel he would benefit from skilled PT in the SNF setting to work on ROM deficits and initiate home program for stretching as well as to assist up to EOB to work on head/trunk control.  PT will not follow as likely for d/c to SNF with follow up PT recommended there.         Recommendations for follow up therapy are one component of a multi-disciplinary discharge planning process, led by the attending physician.  Recommendations may be updated based on patient status, additional functional criteria and insurance authorization.  Follow Up Recommendations Skilled nursing-short term rehab (<3 hours/day)    Assistance Recommended at Discharge Frequent or constant Supervision/Assistance  Patient can return home with the following  Two people to help with walking  and/or transfers;Two people to help with bathing/dressing/bathroom;Assist for transportation;Direct supervision/assist for medications management;Assistance with feeding    Equipment Recommendations Wheelchair (measurements PT);Wheelchair cushion (measurements PT);Hospital bed;Other (comment) (hoyer lift)  Recommendations for Other Services       Functional Status Assessment Patient has had a recent decline in their functional status and/or demonstrates limited ability to make significant improvements in function in a reasonable and predictable amount of time     Precautions / Restrictions Precautions Precautions: Fall;Other (comment) Precaution Comments: trach; PEG      Mobility  Bed Mobility Overal bed mobility: Needs Assistance Bed Mobility: Rolling Rolling: Total assist         General bed mobility comments: Total A for rolling    Transfers                        Ambulation/Gait                  Stairs            Wheelchair Mobility    Modified Rankin (Stroke Patients Only) Modified Rankin (Stroke Patients Only) Pre-Morbid Rankin Score: No symptoms Modified Rankin: Severe disability     Balance                                             Pertinent Vitals/Pain Pain Assessment Pain Assessment: Faces Faces Pain Scale: Hurts a little bit Pain Location: grimacing with L shoulder ROM Pain Descriptors /  Indicators: Grimacing Pain Intervention(s): Monitored during session    Home Living Family/patient expects to be discharged to:: Private residence Living Arrangements: Spouse/significant other Available Help at Discharge: Family Type of Home: House             Additional Comments: no family in the room; information from prior sessions    Prior Function Prior Level of Function : Independent/Modified Independent;Working/employed;Driving               ADLs Comments: Working at [employer]        Extremity/Trunk Assessment   Upper Extremity Assessment Upper Extremity Assessment: LUE deficits/detail;RUE deficits/detail RUE Deficits / Details: PROM limited shoulder flexion to approx 110, elbow flex/ext WFL, decreased forearm supination to about 30 degrees; limited finger extension esp as PIP joints and MCP joints and decreased thumb abduction and wrist extension/flexion LUE Deficits / Details: PROM limited shoulder flexion to approx 110 (with some pain with overpressure), elbow flex/ext WFL, decreased forearm supination to about 30 degrees; limited finger extension esp as PIP joints and MCP joints and decreased thumb abduction and wrist extension/flexion    Lower Extremity Assessment Lower Extremity Assessment: LLE deficits/detail;RLE deficits/detail RLE Deficits / Details: PROM ankle DF moving some past neutral, hip with limited abduction and external rotation, flexion/ext grossly WFL, some increased tone noted with slower to come into extension when flexed (1+/4 on modified [place]) LLE Deficits / Details: PROM ankle DF moving some past neutral, hip with limited abduction and external rotation, flexion/ext grossly WFL, some increased tone noted with slower to come into extension when flexed (1+/4 on modified [place])    Cervical / Trunk Assessment Cervical / Trunk Exceptions: cervical PROM when in supine WFL; extension NT; favors positioning with R lateral flexion and L rotation  Communication   Communication: Tracheostomy  Cognition Arousal/Alertness: Awake/alert Behavior During Therapy: Flat affect   Area of Impairment: Rancho level               [place]o Levels of Cognitive Functioning [place] Scales of Cognitive Functioning: Generalized response               General Comments: limited eye contact, chewing and yawning in response to some stimuli, did grimace at L shoulder end range overpressure; blink to threat but significantly delayed  (2-3 sec)   [place] [employer] Scales of Cognitive Functioning: Generalized response    General Comments General comments (skin integrity, edema, etc.): Noted frothy tracheal secretions on chest suctioned and pt with sputum in mouth suctioned.  on trach collar 28% with occasional coughing and all VSS.    Exercises Other Exercises Other Exercises: performed cervical PROM in supine: rotation and lateral flexion x 5-10 Other Exercises: Supine UE PROM x 5-10 shoulder flex/ER/ABD with passive overpressure; elbow flex/ext; pron/sup; wrist flex/ext and composite finger flex/ext, thumb abd/add Other Exercises: supine LE PROM x 5-10 ankle DF/PF; hip flex/ext; knee flex/ext; hip abd&ER/ADD&IR; stretch to hamstrings and single jt hip extensors x 20 sec x 2  each leg   Assessment/Plan    PT Assessment All further PT needs can be met in the next venue of care  PT Problem List         PT Treatment Interventions      PT Goals (Current goals can be found in the Care Plan section)  Acute Rehab PT Goals PT Goal Formulation: All assessment and education complete, DC therapy    Frequency  Co-evaluation               AM-PAC PT "6 Clicks" Mobility  Outcome Measure Help needed turning from your back to your side while in a flat bed without using bedrails?: Total Help needed moving from lying on your back to sitting on the side of a flat bed without using bedrails?: Total Help needed moving to and from a bed to a chair (including a wheelchair)?: Total Help needed standing up from a chair using your arms (e.g., wheelchair or bedside chair)?: Total Help needed to walk in hospital room?: Total Help needed climbing 3-5 steps with a railing? : Total 6 Click Score: 6    End of Session Equipment Utilized During Treatment: Oxygen Activity Tolerance: Patient tolerated treatment well Patient left: in bed;with call bell/phone within reach   PT Visit Diagnosis: Other abnormalities of gait and  mobility (R26.89);Other symptoms and signs involving the nervous system (R29.898);Muscle weakness (generalized) (M62.81)    Time: 1235-1310 PT Time Calculation (min) (ACUTE ONLY): 35 min   Charges:   PT Evaluation $PT Re-evaluation: 1 Re-eval PT Treatments $Therapeutic Activity: 8-22 mins        Magda Kiel, PT Acute Rehabilitation Services QIONG:[phone] Office:[phone] 08/04/2021   Reginia Naas 08/04/2021, 1:47 PM

## 2021-08-04 NOTE — Progress Notes (Signed)
PROGRESS NOTE    Billy Cherry  [id number] DOB: September 30, 1970 DOA: 03/30/2021  PCP: Patient, No Pcp Per (Inactive)   Brief Narrative:  Patient is a 51 year old male with past medical history significant for hypertension.  Patient was brought into the hospital after being found unresponsive by his wife.  Patient was initially admitted to [facility] hospital and was noted to have out of hospital cardiac arrest and ventricular fibrillation with respiratory arrest suspected from angioedema from ACE inhibitor leading to asphyxiation.   Sequence of events so far. 1/19 left heart cath without significant CAD.  Concern for anoxic brain injury 1/21: MRI shows areas of hypoxic-ischemic injury bilaterally. Febrile; cultures obtained. 1/23 severe brain damage 1/25: PEG . Neuro exam unchanged.  Does exhibit new eyelid tremor like movements and lip/jaw tremor like vs clonus movement.  Bolused Keppra and increased maintenance dose.  Repeat EEG. CTH: concerning for progression of anoxic injury.  1/26: Repeat EEG unchanged and continues to show myoclonic seizures despite addition of Valproic acid yesterday.   03/30/2021 Transfer to [name] from [place] for continuous EEG 1/28: Continues to have myoclonic sz on EEG 2/3 no meaningful neuro recovery. Diuresed 2/6 New fever to 102, WBC 16K - treated with vancomycin for 7d based on cultures.  2/11: percutaneous tracheostomy 2/13 >2/20 on trach collar. No vent.  2/20 Trach change to 6 cuffless 3/13: eliquis started 4/3: fever 06/06/2021 CT of the chest with new PE and haziness consistent with pneumonia 4/5-4/9: responding well to PRN lasix 4/11 fever again after completion of abx- work up in progress 4/13 continues to have low-grade temps cultures from 4/11 no growth to date-suspect silent/occult aspiration-heparin changed back to Eliquis 4/13 4/14: Work-up negative with no growth to date but red menance Tmax elevated 99.4 and respiratory rate up-x-ray did not  show any pneumonia but probably silently aspirating  08/04/2021: Patient seen.  No new changes.  Patient remains unresponsive.  Assessment & Plan:   Principal Problem:   Cardiac arrest [facility]) Active Problems:   Acute respiratory failure ([name])   Myoclonus   Anoxic encephalopathy ([name])   Hypernatremia   HFrEF (heart failure with reduced ejection fraction) ([name])   RLL pneumonia   HTN (hypertension)   Dysphagia   DVT (deep venous thrombosis) ([name])   Pressure injury of skin   Pulmonary embolus ([name])   Fever  S/P Cardiac arrest: Status post cardiac arrest. MRI shows hypoxic ischemic brain injury. 2D echo showed LVEF less than 20% with global hypokinesis. Currently compensated. Continue hydralazine, isosorbide, spironolactone . Very poor prognosis.  Anoxic encephalopathy: Status post tracheostomy and PEG tube placement.   Continue supportive care. Poor prognosis.  Patient is not interactive. Continue Clonazepam, Keppra and valproic acid. Continue aspiration precautions.  Acute respiratory failure: S/p trach collar.  Continue supportive care.   Oxygenation is 99% on 5 L per trach collar with FiO2 28%. PCCM following for trach needs.  Continue care per protocol  HFr EF(chronic systolic heart failure) 2D echo shows LVEF less than 20% at this time. Continue with carvedilol, isosorbide/hydralazine and spironolactone.  R LL pneumonia: Initially received a 7-day course of IV Fortaz. Tracheal aspirate was sent for culture and was significant for normal respiratory flora , concerning for aspiration.   Status post completion of 7-day course of Unasyn at this time.  Continue aspiration precautions. Currently stable and remains afebrile.  Continue suctioning  DVT /PE: Lower extremity duplex on 05/1321 showed age-indeterminate deep venous thrombosis involving left femoral vein, left proximal profunda vein,  left posterior tibial veins, left popliteal vein, left peroneus veins. CT scan of  chest with possible acute on chronic PE. Continue Eliquis at this time.  Dysphagia: Post PEG tube placement and on tube feeding.  Fever > resolved. S/p antibiotic course.  Remains afebrile. At high risk for aspiration.  Continue to monitor  Essential hypertension: Continue amlodipine, carvedilol, hydralazine, isosorbide and spironolactone.  Hypernatremia: Resolved with free water flushes.  Myoclonus: Evaluated by neurology.  Continue Klonopin, Keppra. Per neuro note suppressing his myoclonus will not ultimately change the prognosis.  Pressure injury of the skin: Lateral penis, posterior right heel, right foot, not present on admission.  Pressure Injury 05/25/21 Heel Posterior;Right Unstageable - Full thickness tissue loss in which the base of the injury is covered by slough (yellow, tan, gray, green or brown) and/or eschar (tan, brown or black) in the wound bed. (Active)  05/25/21 0800  Location: Heel  Location Orientation: Posterior;Right  Staging: Unstageable - Full thickness tissue loss in which the base of the injury is covered by slough (yellow, tan, gray, green or brown) and/or eschar (tan, brown or black) in the wound bed.  Wound Description (Comments):   Present on Admission: No     DVT prophylaxis: Eliquis Code Status: Full code. Family Communication: No family at bed side. Disposition Plan:  Status is: Inpatient Remains inpatient appropriate because:  Awaiting safe disposition. Patient has significant needs, Home Care / Nursing arrangement, TOC / LLOS following   Consultants:  PCCM  Procedures: Tracheostomy, PEG tube Antimicrobials:  Fortaz Unasyn  Subjective: Patient is unresponsive. Patient cannot give any history.  Objective: Vitals:   08/04/21 0009 08/04/21 0427 08/04/21 0434 08/04/21 0717  BP: 113/80  (!) 124/92 (!) 133/100  Pulse: 86  88 92  Resp: 18  20 19   Temp: 98.7 F (37.1 C)  98.4 F (36.9 C) 98.5 F (36.9 C)  TempSrc: Oral   Axillary Axillary  SpO2: 95% 99% 99% 95%  Weight:   95 kg   Height:        Intake/Output Summary (Last 24 hours) at 08/04/2021 1052 Last data filed at 08/04/2021 0718 Gross per 24 hour  Intake 0 ml  Output 1875 ml  Net -1875 ml    Filed Weights   08/02/21 0500 08/03/21 0109 08/04/21 0434  Weight: 94 kg 94 kg 95 kg    Examination:  General exam: Appears chronically ill looking, comfortable, not in any acute distress. Respiratory system: CTA bilaterally, respiratory effort normal, trach in place. Cardiovascular system: S1-S2 heard, regular rate and rhythm, no murmur. Gastrointestinal system: Abdomen is soft, mildly distended, non tender, PEG noted Central nervous system: Not interactive, not responsive, open eyes with normal blinking Extremities: No edema.   Data Reviewed: I have personally reviewed following labs and imaging studies  CBC: Recent Labs  Lab 07/29/21 0445 08/02/21 0317  WBC 6.8 6.2  HGB 12.5* 13.0  HCT 39.3 39.5  MCV 94.2 93.2  PLT 324 [id number]    Basic Metabolic Panel: Recent Labs  Lab 07/29/21 0445 08/02/21 0317  NA 138 135  K 4.6 4.7  CL 100 99  CO2 30 30  GLUCOSE 118* 107*  BUN 35* 39*  CREATININE 0.69 0.67  CALCIUM 9.0 9.0  MG  --  2.1  PHOS  --  3.4    GFR: Estimated Creatinine Clearance: 127.8 mL/min (by C-G formula based on SCr of 0.67 mg/dL). Liver Function Tests: No results for input(s): AST, ALT, ALKPHOS, BILITOT, PROT, ALBUMIN in the last  168 hours. No results for input(s): LIPASE, AMYLASE in the last 168 hours. No results for input(s): AMMONIA in the last 168 hours. Coagulation Profile: No results for input(s): INR, PROTIME in the last 168 hours. Cardiac Enzymes: No results for input(s): CKTOTAL, CKMB, CKMBINDEX, TROPONINI in the last 168 hours. BNP (last 3 results) No results for input(s): PROBNP in the last 8760 hours. HbA1C: No results for input(s): HGBA1C in the last 72 hours. CBG: No results for input(s): GLUCAP in the  last 168 hours. Lipid Profile: No results for input(s): CHOL, HDL, LDLCALC, TRIG, CHOLHDL, LDLDIRECT in the last 72 hours. Thyroid Function Tests: No results for input(s): TSH, T4TOTAL, FREET4, T3FREE, THYROIDAB in the last 72 hours. Anemia Panel: No results for input(s): VITAMINB12, FOLATE, FERRITIN, TIBC, IRON, RETICCTPCT in the last 72 hours. Sepsis Labs: No results for input(s): PROCALCITON, LATICACIDVEN in the last 168 hours.  No results found for this or any previous visit (from the past 240 hour(s)).   Radiology Studies: No results found.  Scheduled Meds:  apixaban  5 mg Per Tube BID   atropine  2 drop Sublingual QID   carvedilol  25 mg Per Tube BID   chlorhexidine  15 mL Mouth/Throat BID   clonazepam  0.5 mg Per Tube TID   doxazosin  2 mg Per Tube Daily   feeding supplement (OSMOLITE 1.5 CAL)  355 mL Per Tube QID   feeding supplement (PROSource TF)  45 mL Per Tube TID   fiber  1 packet Per Tube BID   free water  200 mL Per Tube Q6H   Gerhardt's butt cream   Topical Daily   glycopyrrolate  1 mg Per Tube TID   hydrALAZINE  50 mg Per Tube Q8H   isosorbide dinitrate  30 mg Per Tube TID   levETIRAcetam  750 mg Per Tube BID   mouth rinse  15 mL Mouth Rinse q12n4p   nutrition supplement (JUVEN)  1 packet Per Tube BID BM   pantoprazole sodium  40 mg Per Tube QHS   scopolamine  1 patch Transdermal Q72H   spironolactone  25 mg Per Tube Daily   valproic acid  750 mg Per Tube TID   Continuous Infusions:   LOS: 127 days    Time spent: 25 mins    Bonnell Public, MD [facility]   If 7PM-7AM, please contact night-coverage

## 2021-08-05 NOTE — Progress Notes (Signed)
PROGRESS NOTE    Billy Cherry  [id number] DOB: 1970/05/09 DOA: 03/30/2021 PCP: Patient, No Pcp Per (Inactive)    Brief Narrative:  Patient is a 51 years old male with past medical history of hypertension who was brought into the hospital after being found unresponsive by his wife.  Patient was initially admitted to [facility] hospital for out of hospital cardiac arrest and ventricular fibrillation with respiratory arrest suspected from angioedema from ACE inhibitor leading to asphyxiation.    Sequence of events so far. 1/19 left heart cath without significant CAD.  Concern for anoxic brain injury 1/21: MRI shows areas of hypoxic-ischemic injury bilaterally. Fevered; cultures obtained. 1/23 severe brain damage 1/25: PEG . Neuro exam unchanged.  Does exhibit new eyelid tremor like movements and lip/jaw tremor like vs clonus movement.  Bolused Keppra and increased maintenance dose.  Repeat EEG. CTH: concerning for progression of anoxic injury.  1/26: Repeat EEG unchanged and continues to show myoclonic seizures despite addition of Valproic acid yesterday.  03/30/2021 Transfer to [name] from [facility] for continuous EEG 1/28: Continues to have myoclonic sz on EEG 2/3 no meaningful neuro recovery. Diuresed 2/6 New fever to 102, WBC 16K - treated with vancomycin for 7d based on cultures.  2/11: percutaneous tracheostomy 2/13 >2/20 on trach collar. No vent.  2/20 Trach change to 6 cuffless 2/28: fever 3/13: eliquis started 4/3: fever 06/06/2021 CT of the chest with new PE and haziness consistent with pneumonia 4/5-4/9: responding well to PRN lasix 4/11 fever again after completion of abx- work up in progress 4/13 continues to have low-grade temps cultures from 4/11 no growth to date-suspect silent/occult aspiration-heparin changed back to Eliquis 4/13 4/14: Work-up negative with no growth to date but red menance Tmax elevated 99.4 and respiratory rate up-x-ray did not show         any  pneumonia but probably silently aspirating 6/2-23.  Patient remains unresponsive.   Assessment & Plan: Principal Problem:   Cardiac arrest [facility]) Active Problems:   Anoxic encephalopathy ([place])   Acute respiratory failure ([name])   RLL pneumonia   Dysphagia   DVT (deep venous thrombosis) ([name])   Myoclonus   Hypernatremia   HFrEF (heart failure with reduced ejection fraction) ([name])   HTN (hypertension)   Pressure injury of skin   Pulmonary embolus ([name])   Fever   S/P Cardiac arrest: Patient admitted with out of hospital cardiac arrest.  MRI shows hypoxic brain injury.  2D echocardiogram showed LV ejection fraction of less than 20% with global hypokinesis.  Patient had undergone a cardiac catheterization.  Currently compensated.  Continue hydralazine isosorbide spironolactone.  Poor prognosis.    Anoxic encephalopathy: Status post tracheostomy and PEG tube placement.  Continue supportive care.  Continue Klonopin.  Keppra and valproic acid.  High risk for aspiration.  Poor prognosis.   Acute hypoxic respiratory failure: S/p trach, continue trach collar.  PCCM following for tracheostomy management.   HFr EF(chronic systolic heart failure)  2D echo from 03/23/2021 with LVEF less than 20%.  Continue carvedilol, isosorbide/hydralazine and spironolactone.   RLL pneumonia: Recently completed 7-day course of Unasyn.  Continue supportive care including suctioning initially was on 7-day course of Fortaz.  No fever at this time.  Tmax of 98.8 degrees.  High risk for aspiration.   DVT /pulmonary embolism. Lower extremity duplex on 05/15/21 showed age-indeterminate deep venous thrombosis involving left femoral vein, left proximal profunda vein, left posterior tibial veins, left popliteal vein, left peroneus veins. CT scan of chest  with possible acute on chronic PE.  Continue anticoagulation with Eliquis.   Dysphagia: Post PEG tube placement and on tube feeding.  Essential hypertension: Continue  amlodipine, carvedilol, hydralazine, isosorbide and spironolactone.  Overall blood pressure is controlled.   Hypernatremia: Had improved with free water flushes.  No recent labs in the last 72 hours.  Check BMP in AM.   Myoclonus: Evaluated by neurology.  Continue Klonopin, Keppra. Per neuro note suppressing his myoclonus will not ultimately change the prognosis.   Pressure injury of the skin: Lateral penis, posterior right heel, right foot, not present on admission.  Will need continued wound care.     DVT prophylaxis:  apixaban (ELIQUIS) tablet 5 mg   Code Status:     Code Status: Full Code  Disposition: Uncertain at this time Status is: Inpatient Remains inpatient appropriate because: Significant needs home care/nursing regimen,  Family Communication: None today  Consultants:  PCCM  Procedures:  Tracheostomy PEG tube  Antimicrobials:  None currently  Anti-infectives (From admission, onward)    Start     Dose/Rate Route Frequency Ordered Stop   06/07/21 1500  Ampicillin-Sulbactam (UNASYN) 3 g in sodium chloride 0.9 % 100 mL IVPB        3 g 200 mL/hr over 30 Minutes Intravenous Every 6 hours 06/07/21 1320 06/12/21 1032   06/06/21 0745  ceFEPIme (MAXIPIME) 2 g in sodium chloride 0.9 % 100 mL IVPB  Status:  Discontinued        2 g 200 mL/hr over 30 Minutes Intravenous Every 8 hours 06/06/21 0654 06/07/21 1312   05/09/21 0800  vancomycin (VANCOREADY) IVPB 1250 mg/250 mL  Status:  Discontinued        1,250 mg 166.7 mL/hr over 90 Minutes Intravenous Every 12 hours 05/08/21 1621 05/09/21 0954   05/08/21 1715  vancomycin (VANCOREADY) IVPB 2000 mg/400 mL        2,000 mg 200 mL/hr over 120 Minutes Intravenous  Once 05/08/21 1618 05/08/21 1943   05/02/21 1000  cefTAZidime (FORTAZ) 2 g in sodium chloride 0.9 % 100 mL IVPB  Status:  Discontinued        2 g 200 mL/hr over 30 Minutes Intravenous Every 8 hours 05/02/21 0846 05/10/21 0936   04/26/21 0756  ceFAZolin (ANCEF) 2-4  GM/100ML-% IVPB  Status:  Discontinued       Note to Pharmacy: [name] L: cabinet override      04/26/21 0756 04/26/21 0855   04/26/21 0600  ceFAZolin (ANCEF) IVPB 2g/100 mL premix        2 g 200 mL/hr over 30 Minutes Intravenous Every 8 hours 04/25/21 1131 04/26/21 0915   04/23/21 1400  cefTAZidime (FORTAZ) 1 g in sodium chloride 0.9 % 100 mL IVPB        1 g 200 mL/hr over 30 Minutes Intravenous Every 8 hours 04/23/21 1256 04/25/21 2331   04/20/21 1300  ceFEPIme (MAXIPIME) 2 g in sodium chloride 0.9 % 100 mL IVPB  Status:  Discontinued        2 g 200 mL/hr over 30 Minutes Intravenous Every 8 hours 04/20/21 1205 04/23/21 1241   04/14/21 0930  vancomycin (VANCOREADY) IVPB 1250 mg/250 mL        1,250 mg 166.7 mL/hr over 90 Minutes Intravenous Every 24 hours 04/13/21 0902 04/19/21 1247   04/13/21 0930  vancomycin (VANCOREADY) IVPB 2000 mg/400 mL        2,000 mg 200 mL/hr over 120 Minutes Intravenous  Once 04/13/21 0831 04/13/21 1158  04/12/21 0815  ceFEPIme (MAXIPIME) 2 g in sodium chloride 0.9 % 100 mL IVPB  Status:  Discontinued        2 g 200 mL/hr over 30 Minutes Intravenous Every 8 hours 04/12/21 0724 04/13/21 0829   04/06/21 1800  ceFAZolin (ANCEF) IVPB 2g/100 mL premix        2 g 200 mL/hr over 30 Minutes Intravenous Every 8 hours 04/06/21 1403 04/10/21 1913   04/04/21 1000  ceFEPIme (MAXIPIME) 2 g in sodium chloride 0.9 % 100 mL IVPB  Status:  Discontinued        2 g 200 mL/hr over 30 Minutes Intravenous Every 8 hours 04/04/21 0919 04/06/21 1403        Subjective: Today, patient was seen and examined at bedside.  No interval complaints reported.  Patient is nonverbal.  Objective: Vitals:   08/05/21 0331 08/05/21 0404 08/05/21 0507 08/05/21 0739  BP:  (!) 147/116 (!) 156/108 (!) 191/132  Pulse:  90  90  Resp:  20  (!) 23  Temp:  98.4 F (36.9 C)  99 F (37.2 C)  TempSrc:  Axillary    SpO2: 97% 100%  100%  Weight:  95 kg    Height:        Intake/Output  Summary (Last 24 hours) at 08/05/2021 0906 Last data filed at 08/05/2021 0420 Gross per 24 hour  Intake 0 ml  Output 2250 ml  Net -2250 ml   Filed Weights   08/03/21 0109 08/04/21 0434 08/05/21 0404  Weight: 94 kg 95 kg 95 kg    Physical Examination: Body mass index is 30.05 kg/m.  General: Obese built, not in obvious distress, chronically ill, HENT:   No scleral pallor or icterus noted. Oral mucosa is moist.  Tracheostomy in place Chest:  Clear breath sounds.  Diminished breath sounds bilaterally. No crackles or wheezes.  CVS: S1 &S2 heard. No murmur.  Regular rate and rhythm. Abdomen: Soft, nontender, mildly distended, PEG tube in place, bowel sounds are heard.   Extremities: Hypotonic.  No edema Psych: Opens eyes spontaneously, noninteractive nonresponsive CNS: Noninteractive nonresponsive, opens eyes spontaneously Skin: Warm and dry.    Data Reviewed:   CBC: Recent Labs  Lab 08/02/21 0317  WBC 6.2  HGB 13.0  HCT 39.5  MCV 93.2  PLT 99991111    Basic Metabolic Panel: Recent Labs  Lab 08/02/21 0317  NA 135  K 4.7  CL 99  CO2 30  GLUCOSE 107*  BUN 39*  CREATININE 0.67  CALCIUM 9.0  MG 2.1  PHOS 3.4    Liver Function Tests: No results for input(s): AST, ALT, ALKPHOS, BILITOT, PROT, ALBUMIN in the last 168 hours.   Radiology Studies: No results found.    LOS: 128 days    Flora Lipps, MD [facility] Available via Epic secure chat 7am-7pm After these hours, please refer to coverage provider listed on [email or web address] 08/05/2021, 9:06 AM

## 2021-08-05 NOTE — Plan of Care (Signed)
  Problem: Pain Managment: Goal: General experience of comfort will improve Outcome: Adequate for Discharge   Problem: Safety: Goal: Ability to remain free from injury will improve Outcome: Adequate for Discharge   

## 2021-08-05 NOTE — Progress Notes (Signed)
Provided care to this patient for the last three days and no changes in review of systems. Spoke with spouse on Thursday and informed her patient needed to hair facial grooming and haircut. The patient's personal barber came and cut his hair and beard today. Pt  looks much better. No signs nor distress were noted.  Patient's mother called from [place] and returned her call. Informed her how her son was neurologically and informed her of the care we are providing. Mother stated she would visit him soon this month.  Plan of care continues as ordered. Plans for discharge are still pending as ordered.

## 2021-08-06 LAB — CBC
HCT: 39.4 % (ref 39.0–52.0)
Hemoglobin: 13.1 g/dL (ref 13.0–17.0)
MCH: 30.5 pg (ref 26.0–34.0)
MCHC: 33.2 g/dL (ref 30.0–36.0)
MCV: 91.8 fL (ref 80.0–100.0)
Platelets: 173 10*3/uL (ref 150–400)
RBC: 4.29 MIL/uL (ref 4.22–5.81)
RDW: 14 % (ref 11.5–15.5)
WBC: 8.3 10*3/uL (ref 4.0–10.5)
nRBC: 0 % (ref 0.0–0.2)

## 2021-08-06 LAB — BASIC METABOLIC PANEL
Anion gap: 8 (ref 5–15)
BUN: 37 mg/dL — ABNORMAL HIGH (ref 6–20)
CO2: 28 mmol/L (ref 22–32)
Calcium: 9 mg/dL (ref 8.9–10.3)
Chloride: 99 mmol/L (ref 98–111)
Creatinine, Ser: 0.72 mg/dL (ref 0.61–1.24)
GFR, Estimated: >60 mL/min (ref >60)
Glucose, Bld: 105 mg/dL — ABNORMAL HIGH (ref 70–99)
Potassium: 4.8 mmol/L (ref 3.5–5.1)
Sodium: 135 mmol/L (ref 135–145)

## 2021-08-06 LAB — MAGNESIUM: Magnesium: 1.8 mg/dL (ref 1.7–2.4)

## 2021-08-06 NOTE — Progress Notes (Signed)
PROGRESS NOTE    Billy Cherry  [id number] DOB: February 14, 1971 DOA: 03/30/2021 PCP: Patient, No Pcp Per (Inactive)    Brief Narrative:  Patient is a 51 years old male with past medical history of hypertension who was brought into the hospital after being found unresponsive by his wife.  Patient was initially admitted to [facility] hospital for out of hospital cardiac arrest and ventricular fibrillation with respiratory arrest suspected from angioedema from ACE inhibitor leading to asphyxiation.    Sequence of events so far. 1/19 left heart cath without significant CAD.  Concern for anoxic brain injury 1/21: MRI shows areas of hypoxic-ischemic injury bilaterally. Fevered; cultures obtained. 1/23 severe brain damage 1/25: PEG . Neuro exam unchanged.  Does exhibit new eyelid tremor like movements and lip/jaw tremor like vs clonus movement.  Bolused Keppra and increased maintenance dose.  Repeat EEG. CTH: concerning for progression of anoxic injury.  1/26: Repeat EEG unchanged and continues to show myoclonic seizures despite addition of Valproic acid yesterday.  03/30/2021 Transfer to [name] from [place] for continuous EEG 1/28: Continues to have myoclonic sz on EEG 2/3 no meaningful neuro recovery. Diuresed 2/6 New fever to 102, WBC 16K - treated with vancomycin for 7d based on cultures.  2/11: percutaneous tracheostomy 2/13 >2/20 on trach collar. No vent.  2/20 Trach change to 6 cuffless 2/28: fever 3/13: eliquis started 4/3: fever 06/06/2021 CT of the chest with new PE and haziness consistent with pneumonia 4/5-4/9: responding well to PRN lasix 4/11 fever again after completion of abx- work up in progress 4/13 continues to have low-grade temps cultures from 4/11 no growth to date-suspect silent/occult aspiration-heparin changed back to Eliquis 4/13 4/14: Work-up negative with no growth to date but red menance Tmax elevated 99.4 and respiratory rate up-x-ray did not show         any  pneumonia but probably silently aspirating 6/2-23.  Patient remains unresponsive.   Assessment & Plan: Principal Problem:   Cardiac arrest [facility]) Active Problems:   Anoxic encephalopathy ([name])   Acute respiratory failure ([name])   RLL pneumonia   Dysphagia   DVT (deep venous thrombosis) ([name])   Myoclonus   Hypernatremia   HFrEF (heart failure with reduced ejection fraction) ([name])   HTN (hypertension)   Pressure injury of skin   Pulmonary embolus ([name])   Fever   S/P Cardiac arrest: Patient admitted with out-of-hospital cardiac arrest.  MRI brain showed hypoxic brain injury.  2D echocardiogram showed LV ejection fraction of less than 20% with global hypokinesis.  Patient had undergone a cardiac catheterization.  Currently compensated.  Continue hydralazine isosorbide spironolactone.  Poor prognosis.    Anoxic encephalopathy: Status post tracheostomy and PEG tube placement.  Continue supportive care.  Continue Klonopin.  Keppra and valproic acid.  High risk for aspiration.  Poor prognosis.   Acute hypoxic respiratory failure: S/p trach, continue trach collar.  PCCM following for tracheostomy management.   HFr EF(chronic systolic heart failure)  2D echo from 03/23/2021 with LVEF less than 20%.  Continue carvedilol, isosorbide/hydralazine and spironolactone.   RLL pneumonia: Recently completed 7-day course of Unasyn.  No fever at this time.  High risk for aspiration.   DVT /pulmonary embolism. Lower extremity duplex on 05/15/21 showed age-indeterminate deep venous thrombosis involving left femoral vein, left proximal profunda vein, left posterior tibial veins, left popliteal vein, left peroneus veins. CT scan of chest with possible acute on chronic PE.  Continue anticoagulation with Eliquis.   Dysphagia: Post PEG tube placement and  on tube feeding. Tolerating.  Essential hypertension: Continue amlodipine, carvedilol, hydralazine, isosorbide and spironolactone.  Overall blood pressure  is controlled.   Hypernatremia: Had improved with free water flushes.  Sodium of 135 at this time.  Myoclonus: Evaluated by neurology.  Continue Klonopin, Keppra. Per neuro note suppressing his myoclonus will not ultimately change the prognosis.   Pressure injury of the skin: Lateral penis, posterior right heel, right foot, not present on admission.  Will need continued wound care.     DVT prophylaxis:  apixaban (ELIQUIS) tablet 5 mg   Code Status:     Code Status: Full Code  Disposition: Uncertain at this time  Status is: Inpatient  Remains inpatient appropriate because: Significant needs home care/nursing regimen   Family Communication:  None today  Consultants:  PCCM  Procedures:  Tracheostomy PEG tube  Antimicrobials:  None currently  Anti-infectives (From admission, onward)    Start     Dose/Rate Route Frequency Ordered Stop   06/07/21 1500  Ampicillin-Sulbactam (UNASYN) 3 g in sodium chloride 0.9 % 100 mL IVPB        3 g 200 mL/hr over 30 Minutes Intravenous Every 6 hours 06/07/21 1320 06/12/21 1032   06/06/21 0745  ceFEPIme (MAXIPIME) 2 g in sodium chloride 0.9 % 100 mL IVPB  Status:  Discontinued        2 g 200 mL/hr over 30 Minutes Intravenous Every 8 hours 06/06/21 0654 06/07/21 1312   05/09/21 0800  vancomycin (VANCOREADY) IVPB 1250 mg/250 mL  Status:  Discontinued        1,250 mg 166.7 mL/hr over 90 Minutes Intravenous Every 12 hours 05/08/21 1621 05/09/21 0954   05/08/21 1715  vancomycin (VANCOREADY) IVPB 2000 mg/400 mL        2,000 mg 200 mL/hr over 120 Minutes Intravenous  Once 05/08/21 1618 05/08/21 1943   05/02/21 1000  cefTAZidime (FORTAZ) 2 g in sodium chloride 0.9 % 100 mL IVPB  Status:  Discontinued        2 g 200 mL/hr over 30 Minutes Intravenous Every 8 hours 05/02/21 0846 05/10/21 0936   04/26/21 0756  ceFAZolin (ANCEF) 2-4 GM/100ML-% IVPB  Status:  Discontinued       Note to Pharmacy: [name] L: cabinet override      04/26/21  0756 04/26/21 0855   04/26/21 0600  ceFAZolin (ANCEF) IVPB 2g/100 mL premix        2 g 200 mL/hr over 30 Minutes Intravenous Every 8 hours 04/25/21 1131 04/26/21 0915   04/23/21 1400  cefTAZidime (FORTAZ) 1 g in sodium chloride 0.9 % 100 mL IVPB        1 g 200 mL/hr over 30 Minutes Intravenous Every 8 hours 04/23/21 1256 04/25/21 2331   04/20/21 1300  ceFEPIme (MAXIPIME) 2 g in sodium chloride 0.9 % 100 mL IVPB  Status:  Discontinued        2 g 200 mL/hr over 30 Minutes Intravenous Every 8 hours 04/20/21 1205 04/23/21 1241   04/14/21 0930  vancomycin (VANCOREADY) IVPB 1250 mg/250 mL        1,250 mg 166.7 mL/hr over 90 Minutes Intravenous Every 24 hours 04/13/21 0902 04/19/21 1247   04/13/21 0930  vancomycin (VANCOREADY) IVPB 2000 mg/400 mL        2,000 mg 200 mL/hr over 120 Minutes Intravenous  Once 04/13/21 0831 04/13/21 1158   04/12/21 0815  ceFEPIme (MAXIPIME) 2 g in sodium chloride 0.9 % 100 mL IVPB  Status:  Discontinued  2 g 200 mL/hr over 30 Minutes Intravenous Every 8 hours 04/12/21 0724 04/13/21 0829   04/06/21 1800  ceFAZolin (ANCEF) IVPB 2g/100 mL premix        2 g 200 mL/hr over 30 Minutes Intravenous Every 8 hours 04/06/21 1403 04/10/21 1913   04/04/21 1000  ceFEPIme (MAXIPIME) 2 g in sodium chloride 0.9 % 100 mL IVPB  Status:  Discontinued        2 g 200 mL/hr over 30 Minutes Intravenous Every 8 hours 04/04/21 0919 04/06/21 1403      Subjective: Today, patient was seen and examined at bedside.  No interval changes reported. Nonverbal.  Objective: Vitals:   08/06/21 0431 08/06/21 0605 08/06/21 0816 08/06/21 0900  BP: 114/84 (!) 129/98  (!) 139/107  Pulse: 94  92 96  Resp: 20  (!) 28 20  Temp: 98.8 F (37.1 C)   98.8 F (37.1 C)  TempSrc: Axillary   Oral  SpO2: 100%  100% 100%  Weight: 94 kg     Height:        Intake/Output Summary (Last 24 hours) at 08/06/2021 0946 Last data filed at 08/06/2021 0906 Gross per 24 hour  Intake --  Output 1900 ml  Net  -1900 ml   Filed Weights   08/04/21 0434 08/05/21 0404 08/06/21 0431  Weight: 95 kg 95 kg 94 kg    Physical Examination: Body mass index is 29.73 kg/m.    General: Obese built, not in obvious distress, chronically ill HENT:   No scleral pallor or icterus noted. Oral mucosa is moist.  Tracheostomy in place Chest:  Clear breath sounds.  Diminished breath sounds bilaterally. No crackles or wheezes.  CVS: S1 &S2 heard. No murmur.  Regular rate and rhythm. Abdomen: Soft, nontender, nondistended.  Bowel sounds are heard.  PEG tube in place Extremities: Hypotonic, Psych: Opens eyes spontaneously CNS: Spontaneous eye opening, noninteractive, nonresponsive Skin: Warm and dry.    Data Reviewed:   CBC: Recent Labs  Lab 08/02/21 0317 08/06/21 0457  WBC 6.2 8.3  HGB 13.0 13.1  HCT 39.5 39.4  MCV 93.2 91.8  PLT 271 [id number]    Basic Metabolic Panel: Recent Labs  Lab 08/02/21 0317 08/06/21 0457  NA 135 135  K 4.7 4.8  CL 99 99  CO2 30 28  GLUCOSE 107* 105*  BUN 39* 37*  CREATININE 0.67 0.72  CALCIUM 9.0 9.0  MG 2.1 1.8  PHOS 3.4  --     Liver Function Tests: No results for input(s): AST, ALT, ALKPHOS, BILITOT, PROT, ALBUMIN in the last 168 hours.   Radiology Studies: No results found.    LOS: 129 days    Flora Lipps, MD [facility] Available via Epic secure chat 7am-7pm After these hours, please refer to coverage provider listed on [email or web address] 08/06/2021, 9:46 AM

## 2021-08-07 MED ORDER — ISOSORBIDE DINITRATE 30 MG PO TABS: 30.0000 mg | ORAL_TABLET | Freq: Three times a day (TID) | ORAL | Status: DC

## 2021-08-07 MED ORDER — PROSOURCE TF PO LIQD: 45.0000 mL | Freq: Three times a day (TID) | ORAL | Status: DC

## 2021-08-07 MED ORDER — GLYCOPYRROLATE 1 MG PO TABS: 1.0000 mg | ORAL_TABLET | Freq: Three times a day (TID) | ORAL | Status: DC

## 2021-08-07 MED ORDER — PANTOPRAZOLE SODIUM 40 MG PO PACK: 40.0000 mg | PACK | Freq: Every day | ORAL | Status: DC

## 2021-08-07 MED ORDER — FREE WATER: 200.0000 mL | Freq: Four times a day (QID) | Status: DC

## 2021-08-07 MED ORDER — CLONAZEPAM 0.5 MG PO TABS: 0.5000 mg | ORAL_TABLET | Freq: Two times a day (BID) | ORAL | 0 refills | Status: DC

## 2021-08-07 MED ORDER — HYDRALAZINE HCL 50 MG PO TABS: 50.0000 mg | ORAL_TABLET | Freq: Three times a day (TID) | ORAL | Status: DC

## 2021-08-07 NOTE — Progress Notes (Signed)
PROGRESS NOTE    Billy Cherry  [id number] DOB: 05/02/1970 DOA: 03/30/2021 PCP: Patient, No Pcp Per (Inactive)    Brief Narrative:  Patient is a 51 years old male with past medical history of hypertension who was brought into the hospital after being found unresponsive by his wife.  Patient was initially admitted to [facility] hospital for out of hospital cardiac arrest and ventricular fibrillation with respiratory arrest suspected from angioedema from ACE inhibitor leading to asphyxiation.  She was then subsequently transferred to [facility].  Below is the Sequence of events so far. 1/19 left heart cath without significant CAD.  Concern for anoxic brain injury 1/21: MRI shows areas of hypoxic-ischemic injury bilaterally. Fevered; cultures obtained. 1/23 severe brain damage 1/25: PEG . Neuro exam unchanged.  Does exhibit new eyelid tremor like movements and lip/jaw tremor like vs clonus movement.  Bolused Keppra and increased maintenance dose.  Repeat EEG. CTH: concerning for progression of anoxic injury.  1/26: Repeat EEG unchanged and continues to show myoclonic seizures despite addition of Valproic acid yesterday.  03/30/2021 Transfer to [name] from [place] for continuous EEG 1/28: Continues to have myoclonic sz on EEG 2/3 no meaningful neuro recovery. Diuresed 2/6 New fever to 102, WBC 16K - treated with vancomycin for 7d based on cultures.  2/11: percutaneous tracheostomy 2/13 >2/20 on trach collar. No vent.  2/20 Trach change to 6 cuffless 2/28: fever 3/13: eliquis started 4/3: fever 06/06/2021 CT of the chest with new PE and haziness consistent with pneumonia 4/5-4/9: responding well to PRN lasix 4/11 fever again after completion of abx- work up in progress 4/13 continues to have low-grade temps cultures from 4/11 no growth to date-suspect silent/occult aspiration-heparin changed back to Eliquis 4/13 4/14: Work-up negative with no growth to date but red menance Tmax  elevated 99.4 and respiratory rate up-x-ray did not show         any pneumonia but probably silently aspirating 08/04/21- ongoing.  Patient remains unresponsive.   Assessment & Plan: Principal Problem:   Cardiac arrest [facility]) Active Problems:   Anoxic encephalopathy ([name])   Acute respiratory failure ([name])   RLL pneumonia   Dysphagia   DVT (deep venous thrombosis) ([name])   Myoclonus   Hypernatremia   HFrEF (heart failure with reduced ejection fraction) ([name])   HTN (hypertension)   Pressure injury of skin   Pulmonary embolus ([name])   Fever   S/P Cardiac arrest: Patient admitted with out-of-hospital cardiac arrest.  MRI brain showed hypoxic brain injury.  2D echocardiogram showed LV ejection fraction of less than 20% with global hypokinesis.  Patient had undergone a cardiac catheterization.  Currently compensated.  Continue hydralazine, isosorbide, spironolactone.  Poor prognosis.    Anoxic encephalopathy: Has not improved.  Status post tracheostomy and PEG tube placement.  Continue supportive care.  Continue Klonopin,  Keppra and valproic acid.  High risk for aspiration.  Poor prognosis.   Acute hypoxic respiratory failure: S/p trach, continue trach collar.  PCCM following for tracheostomy management.   HFr EF(chronic systolic heart failure)  2D echo from 03/23/2021 with LVEF less than 20%.  Continue carvedilol, isosorbide/hydralazine and spironolactone.   RLL pneumonia: Recently completed 7-day course of Unasyn.  No fever at this time.  High risk for aspiration.   DVT /pulmonary embolism. Lower extremity duplex on 05/15/21 showed age-indeterminate deep venous thrombosis involving left femoral vein, left proximal profunda vein, left posterior tibial veins, left popliteal vein, left peroneus veins. CT scan of chest with possible acute  on chronic PE.  Continue anticoagulation with Eliquis.   Dysphagia: Post PEG tube placement and on tube feeding. Tolerating.  Essential  hypertension: Continue amlodipine, carvedilol, hydralazine, isosorbide and spironolactone.  Overall blood pressure is controlled.   Hypernatremia: Had improved with free water flushes.  Sodium of 135 at this time.  Myoclonus: Seen by neurology during hospitalization continue Klonopin, Keppra. Per neuro note suppressing his myoclonus will not ultimately change the prognosis.   Pressure injury of the skin: Lateral penis, posterior right heel, right foot, not present on admission.  Will need continued wound care.     DVT prophylaxis:  apixaban (ELIQUIS) tablet 5 mg   Code Status:     Code Status: Full Code  Disposition: Uncertain at this time  Status is: Inpatient  Remains inpatient appropriate because: Significant needs, limitation with home care/nursing regimen at disposition.   Family Communication:  None.  Consultants:  PCCM  Procedures:  Tracheostomy PEG tube  Antimicrobials:  None currently  Anti-infectives (From admission, onward)    Start     Dose/Rate Route Frequency Ordered Stop   06/07/21 1500  Ampicillin-Sulbactam (UNASYN) 3 g in sodium chloride 0.9 % 100 mL IVPB        3 g 200 mL/hr over 30 Minutes Intravenous Every 6 hours 06/07/21 1320 06/12/21 1032   06/06/21 0745  ceFEPIme (MAXIPIME) 2 g in sodium chloride 0.9 % 100 mL IVPB  Status:  Discontinued        2 g 200 mL/hr over 30 Minutes Intravenous Every 8 hours 06/06/21 0654 06/07/21 1312   05/09/21 0800  vancomycin (VANCOREADY) IVPB 1250 mg/250 mL  Status:  Discontinued        1,250 mg 166.7 mL/hr over 90 Minutes Intravenous Every 12 hours 05/08/21 1621 05/09/21 0954   05/08/21 1715  vancomycin (VANCOREADY) IVPB 2000 mg/400 mL        2,000 mg 200 mL/hr over 120 Minutes Intravenous  Once 05/08/21 1618 05/08/21 1943   05/02/21 1000  cefTAZidime (FORTAZ) 2 g in sodium chloride 0.9 % 100 mL IVPB  Status:  Discontinued        2 g 200 mL/hr over 30 Minutes Intravenous Every 8 hours 05/02/21 0846 05/10/21  0936   04/26/21 0756  ceFAZolin (ANCEF) 2-4 GM/100ML-% IVPB  Status:  Discontinued       Note to Pharmacy: Kevan Ny L: cabinet override      04/26/21 0756 04/26/21 0855   04/26/21 0600  ceFAZolin (ANCEF) IVPB 2g/100 mL premix        2 g 200 mL/hr over 30 Minutes Intravenous Every 8 hours 04/25/21 1131 04/26/21 0915   04/23/21 1400  cefTAZidime (FORTAZ) 1 g in sodium chloride 0.9 % 100 mL IVPB        1 g 200 mL/hr over 30 Minutes Intravenous Every 8 hours 04/23/21 1256 04/25/21 2331   04/20/21 1300  ceFEPIme (MAXIPIME) 2 g in sodium chloride 0.9 % 100 mL IVPB  Status:  Discontinued        2 g 200 mL/hr over 30 Minutes Intravenous Every 8 hours 04/20/21 1205 04/23/21 1241   04/14/21 0930  vancomycin (VANCOREADY) IVPB 1250 mg/250 mL        1,250 mg 166.7 mL/hr over 90 Minutes Intravenous Every 24 hours 04/13/21 0902 04/19/21 1247   04/13/21 0930  vancomycin (VANCOREADY) IVPB 2000 mg/400 mL        2,000 mg 200 mL/hr over 120 Minutes Intravenous  Once 04/13/21 0831 04/13/21 1158  04/12/21 0815  ceFEPIme (MAXIPIME) 2 g in sodium chloride 0.9 % 100 mL IVPB  Status:  Discontinued        2 g 200 mL/hr over 30 Minutes Intravenous Every 8 hours 04/12/21 0724 04/13/21 0829   04/06/21 1800  ceFAZolin (ANCEF) IVPB 2g/100 mL premix        2 g 200 mL/hr over 30 Minutes Intravenous Every 8 hours 04/06/21 1403 04/10/21 1913   04/04/21 1000  ceFEPIme (MAXIPIME) 2 g in sodium chloride 0.9 % 100 mL IVPB  Status:  Discontinued        2 g 200 mL/hr over 30 Minutes Intravenous Every 8 hours 04/04/21 0919 04/06/21 1403      Subjective: Today, patient was seen and examined at bedside.  No interval complaints reported.  No improvement in mentation   Objective: Vitals:   08/07/21 0323 08/07/21 0400 08/07/21 0505 08/07/21 0711  BP:  118/79  115/79  Pulse: 96 96  92  Resp: (!) 24 20  (!) 22  Temp:  98.8 F (37.1 C)  99.3 F (37.4 C)  TempSrc:  Axillary  Oral  SpO2: 100% 99%  100%  Weight:   97  kg   Height:        Intake/Output Summary (Last 24 hours) at 08/07/2021 0932 Last data filed at 08/07/2021 0644 Gross per 24 hour  Intake 1055 ml  Output 1600 ml  Net -545 ml   Filed Weights   08/05/21 0404 08/06/21 0431 08/07/21 0505  Weight: 95 kg 94 kg 97 kg    Physical Examination: Body mass index is 30.68 kg/m.   General: Obese built, not in obvious distress, chronically ill. HENT:   No scleral pallor or icterus noted. Oral mucosa is moist.  Tracheostomy in place. Chest:  Clear breath sounds.  Diminished breath sounds bilaterally. No crackles or wheezes.  CVS: S1 &S2 heard. No murmur.  Regular rate and rhythm. Abdomen: Soft, nontender, nondistended.  Bowel sounds are heard.  PEG tube in place. Extremities: No cyanosis, clubbing or edema.  Peripheral pulses are palpable. Psych: Awake only, spontaneous eye movement. CNS: Spontaneous IV awakening.  Hypotonic extremities, none responsive noninteractive Skin: Warm and dry.     Data Reviewed:   CBC: Recent Labs  Lab 08/02/21 0317 08/06/21 0457  WBC 6.2 8.3  HGB 13.0 13.1  HCT 39.5 39.4  MCV 93.2 91.8  PLT 271 [id number]    Basic Metabolic Panel: Recent Labs  Lab 08/02/21 0317 08/06/21 0457  NA 135 135  K 4.7 4.8  CL 99 99  CO2 30 28  GLUCOSE 107* 105*  BUN 39* 37*  CREATININE 0.67 0.72  CALCIUM 9.0 9.0  MG 2.1 1.8  PHOS 3.4  --     Liver Function Tests: No results for input(s): AST, ALT, ALKPHOS, BILITOT, PROT, ALBUMIN in the last 168 hours.   Radiology Studies: No results found.    LOS: 130 days    Flora Lipps, MD [facility] Available via Epic secure chat 7am-7pm After these hours, please refer to coverage provider listed on [email or web address] 08/07/2021, 9:32 AM

## 2021-08-07 NOTE — Progress Notes (Addendum)
CSW was notified by Olegario Messier at [facility] that the facility cannot accommodate the patient's needs and cannot accept him into the facility.  Edwin Dada, MSW, LCSW Transitions of Care  Clinical Social Worker II [phone]

## 2021-08-07 NOTE — Discharge Summary (Signed)
Physician Discharge Summary   Patient: Billy Cherry MRN: [id number] DOB: 1970-06-26  Admit date:     03/30/2021  Discharge date: 08/07/21  Discharge Physician: Flora Lipps   PCP: Patient, No Pcp Per (Inactive)   Recommendations at discharge:   Follow-up with your primary care provider at the skilled nursing facility in 3 to 5 days. Check CBC, BMP, magnesium and LFT in the next visit Patient will need continued tracheostomy and PEG tube care.  Discharge Diagnoses: Principal Problem:   Cardiac arrest [facility]) Active Problems:   Anoxic encephalopathy ([place])   Acute respiratory failure ([name])   Dysphagia   DVT (deep venous thrombosis) ([name])   Myoclonus   HFrEF (heart failure with reduced ejection fraction) ([name])   HTN (hypertension)   Pressure injury of skin   Pulmonary embolus ([name])  Resolved Problems:   RLL pneumonia   Hypernatremia   Fever  Hospital Course: Patient is a 51 years old male with past medical history of hypertension who was brought into the hospital after being found unresponsive by his wife.  Patient was initially admitted to [facility] hospital for out of hospital cardiac arrest and ventricular fibrillation with respiratory arrest suspected from angioedema from ACE inhibitor leading to asphyxiation.  She was then subsequently transferred to [facility].  Below is the Sequence of events during hospitalization. 1/19 left heart cath without significant CAD.  Concern for anoxic brain injury 1/21: MRI shows areas of hypoxic-ischemic injury bilaterally. Fevered; cultures obtained. 1/23 severe brain damage 1/25: PEG . Neuro exam unchanged.  Does exhibit new eyelid tremor like movements and lip/jaw tremor like vs clonus movement.  Bolused Keppra and increased maintenance dose.  Repeat EEG. CTH: concerning for progression of anoxic injury.  1/26: Repeat EEG unchanged and continues to show myoclonic seizures despite addition of Valproic acid yesterday.  03/30/2021  Transfer to [name] from [facility] for continuous EEG 1/28: Continues to have myoclonic sz on EEG 2/3 no meaningful neuro recovery. Diuresed 2/6 New fever to 102, WBC 16K - treated with vancomycin for 7d based on cultures.  2/11: percutaneous tracheostomy 2/13 >2/20 on trach collar. No vent.  2/20 Trach change to 6 cuffless 2/28: fever 3/13: eliquis started 4/3: fever 06/06/2021 CT of the chest with new PE and haziness consistent with pneumonia 4/5-4/9: responding well to PRN lasix 4/11 fever again after completion of abx- work up in progress 4/13 continues to have low-grade temps cultures from 4/11 no growth to date-suspect silent/occult aspiration-heparin changed back to Eliquis 4/13 4/14: Work-up negative with no growth to date but red menance Tmax elevated 99.4 and respiratory rate up-x-ray did not show         any pneumonia but probably silently aspirating 08/04/21- ongoing.  Patient remains unresponsive.   Assessment & Plan: Principal Problem:   Cardiac arrest [facility]) Active Problems:   Anoxic encephalopathy ([name])   Acute respiratory failure ([name])   Dysphagia   DVT (deep venous thrombosis) ([name])   Myoclonus   HFrEF (heart failure with reduced ejection fraction) ([name])   HTN (hypertension)   Pressure injury of skin   Pulmonary embolus ([name])   S/P Cardiac arrest: Patient was admitted with out-of-hospital cardiac arrest.  MRI brain showed hypoxic brain injury.  2D echocardiogram showed LV ejection fraction of less than 20% with global hypokinesis.  Patient had undergone a cardiac catheterization.  Currently compensated.  Continue hydralazine, isosorbide, spironolactone on discharge..  Patient has overall poor prognosis.  Continue tracheostomy and PEG tube care.   Anoxic  encephalopathy: Neurologically has not improved.  Status post tracheostomy and PEG tube placement.  Continue supportive care.  Continue Klonopin,  Keppra and valproic acid.  High risk for aspiration.  Poor  prognosis.   Acute hypoxic respiratory failure: S/p trach, continue trach collar.  Pulmonary followed the patient during hospitalization.   HFr EF(chronic systolic heart failure)  2D echo from 03/23/2021 with LVEF less than 20%.  Continue carvedilol, isosorbide/hydralazine and spironolactone.   RLL pneumonia: Pleated multiple courses of antibiotic during hospitalization.  Clinically improved at this time.   DVT /pulmonary embolism. Lower extremity duplex on 05/15/21 showed age-indeterminate deep venous thrombosis involving left femoral vein, left proximal profunda vein, left posterior tibial veins, left popliteal vein, left peroneus veins. CT scan of chest with possible acute on chronic PE.  Continue anticoagulation with Eliquis.   Dysphagia: Post PEG tube placement and on tube feeding. Tolerating.  Essential hypertension: Continue amlodipine, carvedilol, hydralazine, isosorbide and spironolactone.  Overall blood pressure is controlled.   Hypernatremia: Had improved with free water flushes.  Sodium of 135 at this time.  On free water flushes 200 mL every 6 hourly.  Myoclonus: Seen by neurology during hospitalization continue Klonopin, Keppra. Per neuro note suppressing his myoclonus will not ultimately change the prognosis.   Pressure injury of the skin: Lateral penis, posterior right heel, right foot, not present on admission.  Will need continued wound care on discharge.  Consultants:  PCCM Infectious disease Wound care GI ENT  Procedures performed:  Left heart catheterization 03/23/2021 EEG Tracheostomy on 04/15/2021 PEG tube placement  Disposition: Skilled nursing facility Diet recommendation:  NPO tube feeding DISCHARGE MEDICATION: Allergies as of 08/07/2021       Reactions   Lisinopril Swelling   angioedema        Medication List     STOP taking these medications    Chlorhexidine Gluconate Cloth 2 % Pads   clevidipine 0.5 MG/ML Emul Commonly known as:  CLEVIPREX   dextrose 5 % solution   docusate 50 MG/5ML liquid Commonly known as: COLACE   enoxaparin 60 MG/0.6ML injection Commonly known as: LOVENOX   HYDROmorphone 1 MG/ML injection Commonly known as: DILAUDID   insulin aspart 100 UNIT/ML injection Commonly known as: novoLOG   labetalol 5 MG/ML injection Commonly known as: NORMODYNE   levETIRAcetam 750 mg in sodium chloride 0.9 % 100 mL   ondansetron 4 MG/2ML Soln injection Commonly known as: ZOFRAN   polyethylene glycol 17 g packet Commonly known as: MIRALAX / GLYCOLAX Replaced by: polyethylene glycol powder 17 GM/SCOOP powder   propofol 1000 MG/100ML Emul injection Commonly known as: DIPRIVAN   sodium chloride 0.9 % infusion   sodium chloride flush 0.9 % Soln Commonly known as: NS       TAKE these medications    acetaminophen 325 MG tablet Commonly known as: TYLENOL Place 2 tablets (650 mg total) into feeding tube every 6 (six) hours as needed for mild pain or fever.   carvedilol 25 MG tablet Commonly known as: COREG Place 1 tablet (25 mg total) into feeding tube 2 (two) times daily with a meal.   chlorhexidine gluconate (MEDLINE KIT) 0.12 % solution Commonly known as: PERIDEX 15 mLs by Mouth Rinse route 2 (two) times daily.   clonazePAM 0.5 MG tablet Commonly known as: KLONOPIN Place 1 tablet (0.5 mg total) into feeding tube 2 (two) times daily.   doxazosin 2 MG tablet Commonly known as: CARDURA Place 1 tablet (2 mg total) into feeding tube daily.  Eliquis 5 MG Tabs tablet Generic drug: apixaban Place 1 tablet (5 mg total) into feeding tube 2 (two) times daily.   feeding supplement (PROSource TF) liquid Place 45 mLs into feeding tube 3 (three) times daily. What changed:  how much to take when to take this Another medication with the same name was removed. Continue taking this medication, and follow the directions you see here.   fiber Pack packet Place 1 packet into feeding tube 2 (two)  times daily.   free water Soln Place 200 mLs into feeding tube every 6 (six) hours. What changed:  how much to take when to take this   glycopyrrolate 1 MG tablet Commonly known as: ROBINUL Place 1 tablet (1 mg total) into feeding tube 3 (three) times daily.   hydrALAZINE 50 MG tablet Commonly known as: APRESOLINE Place 1 tablet (50 mg total) into feeding tube 3 (three) times daily. What changed:  medication strength how much to take when to take this Another medication with the same name was removed. Continue taking this medication, and follow the directions you see here.   Imodium A-D 1 MG/7.5ML Liqd Generic drug: Loperamide HCl Place 15 mLs (2 mg total) into feeding tube daily as needed for diarrhea or loose stools.   isosorbide dinitrate 30 MG tablet Commonly known as: ISORDIL Place 1 tablet (30 mg total) into feeding tube 3 (three) times daily. What changed:  medication strength how much to take   levETIRAcetam 100 MG/ML solution Commonly known as: KEPPRA Place 7.5 mLs (750 mg total) into feeding tube 2 (two) times daily.   mouth rinse Liqd solution 15 mLs by Mouth Rinse route every 4 (four) hours.   pantoprazole sodium 40 mg Commonly known as: PROTONIX Place 40 mg into feeding tube at bedtime. What changed: when to take this   polyethylene glycol powder 17 GM/SCOOP powder Commonly known as: GLYCOLAX/MIRALAX Place 1 capful  (17 g) into feeding tube daily as needed for mild constipation or moderate constipation. Replaces: polyethylene glycol 17 g packet   scopolamine 1 MG/3DAYS Commonly known as: TRANSDERM-SCOP Place 1 patch (1.5 mg total) onto the skin every 3 (three) days.   spironolactone 25 MG tablet Commonly known as: ALDACTONE Place 1 tablet (25 mg total) into feeding tube daily.   valproic acid 250 MG/5ML solution Commonly known as: DEPAKENE Place 15 mLs (750 mg total) into feeding tube 3 (three) times daily. What changed: how much to take        ASK your doctor about these medications    feeding supplement (OSMOLITE 1.5 CAL) Liqd Place 355 mLs into feeding tube 4 (four) times daily. Ask about: Should I take this medication?   feeding supplement (PROSource TF) liquid Place 45 mLs into feeding tube 2 (two) times daily for 3 days. Ask about: Should I take this medication?   nutrition supplement (JUVEN) Pack Place 1 packet into feeding tube 2 (two) times daily between meals for 3 days. Ask about: Should I take this medication?               Durable Medical Equipment  (From admission, onward)           Start     Ordered   07/05/21 1050  For home use only DME Hospital bed  Once       Comments: Please call wife after 6 pm to coordinate delivery.  Question Answer Comment  Length of Need Lifetime   Patient has (list medical condition): anoxic brain injury,  cardiae arrest   The above medical condition requires: Patient requires the ability to reposition frequently   Head must be elevated greater than: 30 degrees   Bed type Semi-electric   Hoyer Lift Yes   Support Surface: Alternating Pressure Pad and Pump      07/05/21 1050   07/03/21 1027  For home use only DME Other see comment  Once       Comments: APP mattress  Chuck pads, Diaper wipes, open Briefs XL  Question:  Length of Need  Answer:  Lifetime   07/03/21 1027   06/27/21 1648  For home use only DME oxygen  Once       Question Answer Comment  Length of Need 12 Months   Mode or (Route) Nasal cannula   Liters per Minute 5   Frequency Continuous (stationary and portable oxygen unit needed)   Oxygen conserving device Yes   Oxygen delivery system Gas      06/27/21 1647              Discharge Care Instructions  (From admission, onward)           Start     Ordered   08/07/21 0000  Discharge wound care:       Comments: Place foam dressing over the right heel and right plantar foot and assess daily. Change foam dressing every 3 days.    08/07/21 1245            Contact information for follow-up providers     [facility]. Call.   Specialty: Family Medicine Why: Please call to schedule initial home visit within 7-10 days after patient's discharge home from the hospital. Contact information: [place] [phone] [place], [facility] Follow up.   Why: [employer] will be providing home equipment including hospital bed, APP mattress, incontinence products, tracheostomy supplies, oxygen supplies, suction equipment and tube (bolus feed supplies). Contact information: [address] [place] [address] [place] Follow up.   Why: [employer] will be providing private duty nursing for home health services. Contact information: [place] [address] [phone]              Contact information for after-discharge care     Destination     [facility] Preferred SNF .   Service: Skilled Nursing Contact information: [address] [place] [phone]                    Subjective: Today, patient was seen and examined at bedside.  No interval complaints reported.  No improvement in mentation   Discharge Exam: Filed Weights   08/05/21 0404 08/06/21 0431 08/07/21 0505  Weight: 95 kg 94 kg 97 kg      08/07/2021   11:21 AM 08/07/2021    9:28 AM 08/07/2021    7:11 AM  Vitals with BMI  Systolic [phone]  Diastolic 742 85 79  Pulse 93 97 92   Body mass index is 30.68 kg/m.  General: Obese built, not in obvious distress, chronically ill. HENT:   No scleral pallor or icterus noted. Oral mucosa is moist.  Tracheostomy in place. Chest:  Clear breath sounds.  Diminished breath sounds bilaterally. No crackles or wheezes.  CVS: S1 &S2 heard. No  murmur.  Regular rate and  rhythm. Abdomen: Soft, nontender, nondistended.  Bowel sounds are heard.  PEG tube in place. Extremities: No cyanosis, clubbing or edema.  Peripheral pulses are palpable. Psych: Awake only, spontaneous eye movement. CNS: Spontaneous eye opening.  Hypotonic extremities, non responsive noninteractive Skin: Warm and dry.     Condition at discharge: stable  The results of significant diagnostics from this hospitalization (including imaging, microbiology, ancillary and laboratory) are listed below for reference.   Imaging Studies: No results found.  Microbiology: Results for orders placed or performed during the hospital encounter of 03/30/21  Culture, Respiratory w Gram Stain     Status: None   Collection Time: 04/04/21  7:57 AM   Specimen: Tracheal Aspirate; Respiratory  Result Value Ref Range Status   Specimen Description TRACHEAL ASPIRATE  Final   Special Requests NONE  Final   Gram Stain   Final    ABUNDANT WBC PRESENT, PREDOMINANTLY PMN ABUNDANT GRAM POSITIVE COCCI IN PAIRS IN CLUSTERS ABUNDANT GRAM POSITIVE RODS RARE GRAM NEGATIVE RODS    Culture   Final    ABUNDANT STAPHYLOCOCCUS AUREUS MODERATE DIPHTHEROIDS(CORYNEBACTERIUM SPECIES) Standardized susceptibility testing for this organism is not available. Performed at [facility], [place] [address] [place], [place] [address]    Report Status 04/06/2021 FINAL  Final   Organism ID, Bacteria STAPHYLOCOCCUS AUREUS  Final      Susceptibility   Staphylococcus aureus - MIC*    CIPROFLOXACIN <=0.5 SENSITIVE Sensitive     ERYTHROMYCIN <=0.25 SENSITIVE Sensitive     GENTAMICIN <=0.5 SENSITIVE Sensitive     OXACILLIN <=0.25 SENSITIVE Sensitive     TETRACYCLINE <=1 SENSITIVE Sensitive     VANCOMYCIN <=0.5 SENSITIVE Sensitive     TRIMETH/SULFA <=10 SENSITIVE Sensitive     CLINDAMYCIN <=0.25 SENSITIVE Sensitive     RIFAMPIN <=0.5 SENSITIVE Sensitive     Inducible Clindamycin NEGATIVE Sensitive     * ABUNDANT STAPHYLOCOCCUS  AUREUS  Culture, Respiratory w Gram Stain     Status: None   Collection Time: 04/10/21  8:34 AM   Specimen: Tracheal Aspirate; Respiratory  Result Value Ref Range Status   Specimen Description TRACHEAL ASPIRATE  Final   Special Requests NONE  Final   Gram Stain   Final    RARE WBC PRESENT, PREDOMINANTLY MONONUCLEAR RARE GRAM POSITIVE COCCI IN PAIRS RARE GRAM NEGATIVE RODS    Culture   Final    ABUNDANT STAPHYLOCOCCUS AUREUS ABUNDANT FLAVOBACTERIUM [place] DIPHTHEROIDS(CORYNEBACTERIUM SPECIES) Standardized susceptibility testing for this organism is not available. Performed at [facility], [place] [address], [place], [place] [address]    Report Status 04/13/2021 FINAL  Final   Organism ID, Bacteria STAPHYLOCOCCUS AUREUS  Final   Organism ID, Bacteria FLAVOBACTERIUM ODORATUM  Final      Susceptibility   Flavobacterium odoratum - MIC*    CEFAZOLIN >=64 RESISTANT Resistant     CEFTAZIDIME >=64 RESISTANT Resistant     CIPROFLOXACIN 1 SENSITIVE Sensitive     GENTAMICIN >=16 RESISTANT Resistant     IMIPENEM 8 INTERMEDIATE Intermediate     TRIMETH/SULFA 40 SENSITIVE Sensitive     PIP/TAZO 16 SENSITIVE Sensitive     * ABUNDANT FLAVOBACTERIUM ODORATUM   Staphylococcus aureus - MIC*    CIPROFLOXACIN <=0.5 SENSITIVE Sensitive     ERYTHROMYCIN <=0.25 SENSITIVE Sensitive     GENTAMICIN <=0.5 SENSITIVE Sensitive     OXACILLIN <=0.25 SENSITIVE Sensitive     TETRACYCLINE <=1 SENSITIVE Sensitive     VANCOMYCIN <=  0.5 SENSITIVE Sensitive     TRIMETH/SULFA <=10 SENSITIVE Sensitive     CLINDAMYCIN <=0.25 SENSITIVE Sensitive     RIFAMPIN <=0.5 SENSITIVE Sensitive     Inducible Clindamycin NEGATIVE Sensitive     * ABUNDANT STAPHYLOCOCCUS AUREUS  Culture, blood (routine x 2)     Status: None   Collection Time: 04/10/21  9:00 AM   Specimen: BLOOD RIGHT HAND  Result Value Ref Range Status   Specimen Description BLOOD RIGHT HAND  Final   Special Requests   Final    BOTTLES DRAWN  AEROBIC AND ANAEROBIC Blood Culture adequate volume   Culture   Final    NO GROWTH 5 DAYS Performed at [facility], [place] [address], [place], [place] [address]    Report Status 04/15/2021 FINAL  Final  Culture, blood (routine x 2)     Status: None   Collection Time: 04/10/21  9:08 AM   Specimen: BLOOD LEFT HAND  Result Value Ref Range Status   Specimen Description BLOOD LEFT HAND  Final   Special Requests   Final    BOTTLES DRAWN AEROBIC AND ANAEROBIC Blood Culture adequate volume   Culture   Final    NO GROWTH 5 DAYS Performed at [facility], [place] [address] [place], [place] [address]    Report Status 04/15/2021 FINAL  Final  Culture, blood (x 2)     Status: None   Collection Time: 04/20/21  8:16 AM   Specimen: BLOOD LEFT WRIST  Result Value Ref Range Status   Specimen Description BLOOD LEFT WRIST  Final   Special Requests   Final    BOTTLES DRAWN AEROBIC AND ANAEROBIC Blood Culture adequate volume   Culture   Final    NO GROWTH 5 DAYS Performed at [facility], [place] [address], [place], [place] [address]    Report Status 04/25/2021 FINAL  Final  Culture, blood (x 2)     Status: None   Collection Time: 04/20/21  8:16 AM   Specimen: BLOOD RIGHT ARM  Result Value Ref Range Status   Specimen Description BLOOD RIGHT ARM  Final   Special Requests   Final    BOTTLES DRAWN AEROBIC AND ANAEROBIC Blood Culture adequate volume   Culture   Final    NO GROWTH 5 DAYS Performed at [facility], [place] [address] [place], [place] [address]    Report Status 04/25/2021 FINAL  Final  Culture, Respiratory w Gram Stain     Status: None   Collection Time: 04/20/21 11:01 AM   Specimen: Tracheal Aspirate  Result Value Ref Range Status   Specimen Description TRACHEAL ASPIRATE  Final   Special Requests NONE  Final   Gram Stain   Final    ABUNDANT WBC PRESENT, PREDOMINANTLY PMN ABUNDANT GRAM NEGATIVE RODS FEW GRAM POSITIVE COCCI IN PAIRS Performed at [facility], [place] [address], [place], [place] [address]    Culture ABUNDANT PSEUDOMONAS AERUGINOSA  Final   Report Status 04/23/2021 FINAL  Final   Organism ID, Bacteria PSEUDOMONAS AERUGINOSA  Final      Susceptibility   Pseudomonas aeruginosa - MIC*    CEFTAZIDIME 4 SENSITIVE Sensitive     CIPROFLOXACIN <=0.25 SENSITIVE Sensitive     GENTAMICIN <=1 SENSITIVE Sensitive     IMIPENEM 2 SENSITIVE Sensitive     PIP/TAZO 8 SENSITIVE Sensitive     CEFEPIME 2 SENSITIVE Sensitive     * ABUNDANT PSEUDOMONAS AERUGINOSA  Resp Panel by RT-PCR (Flu A&B, Covid) Nasopharyngeal Swab     Status: None   Collection Time: 04/21/21 10:27 AM   Specimen: Nasopharyngeal Swab; Nasopharyngeal(NP) swabs in vial transport medium  Result Value Ref Range Status   SARS Coronavirus 2 by RT PCR NEGATIVE NEGATIVE Final    Comment: (NOTE) SARS-CoV-2 target nucleic acids are NOT DETECTED.  The SARS-CoV-2 RNA is generally detectable in upper respiratory specimens during the acute phase of infection. The lowest concentration of SARS-CoV-2 viral copies this assay can detect is 138 copies/mL. A negative result does not preclude SARS-Cov-2 infection and should not be used as the sole basis for treatment or other patient management decisions. A negative result may occur with  improper specimen collection/handling, submission of specimen other than nasopharyngeal swab, presence of viral mutation(s) within the areas targeted by this assay, and inadequate number of viral copies(<138 copies/mL). A negative result must be combined with clinical observations, patient history, and epidemiological information. The expected result is Negative.  Fact Sheet for Patients:  EntrepreneurPulse.com.au  Fact Sheet for Healthcare Providers:  IncredibleEmployment.be  This test is no t yet approved or cleared by the [place] FDA and  has been authorized for detection and/or diagnosis of SARS-CoV-2  by FDA under an Emergency Use Authorization (EUA). This EUA will remain  in effect (meaning this test can be used) for the duration of the COVID-19 declaration under Section 564(b)(1) of the Act, 21 U.S.C.section 360bbb-3(b)(1), unless the authorization is terminated  or revoked sooner.       Influenza A by PCR NEGATIVE NEGATIVE Final   Influenza B by PCR NEGATIVE NEGATIVE Final    Comment: (NOTE) The Xpert Xpress SARS-CoV-2/FLU/RSV plus assay is intended as an aid in the diagnosis of influenza from Nasopharyngeal swab specimens and should not be used as a sole basis for treatment. Nasal washings and aspirates are unacceptable for Xpert Xpress SARS-CoV-2/FLU/RSV testing.  Fact Sheet for Patients: EntrepreneurPulse.com.au  Fact Sheet for Healthcare Providers: IncredibleEmployment.be  This test is not yet approved or cleared by the [place] FDA and has been authorized for detection and/or diagnosis of SARS-CoV-2 by FDA under an Emergency Use Authorization (EUA). This EUA will remain in effect (meaning this test can be used) for the duration of the COVID-19 declaration under Section 564(b)(1) of the Act, 21 U.S.C. section 360bbb-3(b)(1), unless the authorization is terminated or revoked.  Performed at [facility], [place] [address] [place], [place] [address]   Culture, Respiratory w Gram Stain     Status: None   Collection Time: 05/02/21  7:13 AM   Specimen: Tracheal Aspirate; Respiratory  Result Value Ref Range Status   Specimen Description TRACHEAL ASPIRATE  Final   Special Requests NONE  Final   Gram Stain   Final    MODERATE SQUAMOUS EPITHELIAL CELLS PRESENT ABUNDANT WBC PRESENT, PREDOMINANTLY PMN FEW GRAM NEGATIVE RODS MODERATE GRAM POSITIVE COCCI    Culture   Final    FEW PSEUDOMONAS AERUGINOSA WITHIN MIXED NORMAL RESPIRATORY FLORA Performed at [facility], [place] [address], [place], [place] [address]    Report  Status 05/05/2021 FINAL  Final   Organism ID, Bacteria PSEUDOMONAS AERUGINOSA  Final      Susceptibility   Pseudomonas aeruginosa - MIC*    CEFTAZIDIME 4 SENSITIVE Sensitive     CIPROFLOXACIN <=0.25 SENSITIVE Sensitive     GENTAMICIN <=1 SENSITIVE Sensitive     IMIPENEM 2 SENSITIVE Sensitive     PIP/TAZO 8 SENSITIVE Sensitive  CEFEPIME 2 SENSITIVE Sensitive     * FEW PSEUDOMONAS AERUGINOSA  Culture, blood (routine x 2)     Status: Abnormal   Collection Time: 05/07/21 12:30 PM   Specimen: BLOOD RIGHT HAND  Result Value Ref Range Status   Specimen Description BLOOD RIGHT HAND  Final   Special Requests   Final    BOTTLES DRAWN AEROBIC AND ANAEROBIC Blood Culture results may not be optimal due to an inadequate volume of blood received in culture bottles   Culture  Setup Time   Final    GRAM POSITIVE COCCI IN CLUSTERS IN BOTH AEROBIC AND ANAEROBIC BOTTLES CRITICAL RESULT CALLED TO, READ BACK BY AND VERIFIED WITH: PHARM D E.UELAND ON [id number] AT [id number] BY E.PARRISH    Culture (A)  Final    STAPHYLOCOCCUS EPIDERMIDIS THE SIGNIFICANCE OF ISOLATING THIS ORGANISM FROM A SINGLE SET OF BLOOD CULTURES WHEN MULTIPLE SETS ARE DRAWN IS UNCERTAIN. PLEASE NOTIFY THE MICROBIOLOGY DEPARTMENT WITHIN ONE WEEK IF SPECIATION AND SENSITIVITIES ARE REQUIRED. STAPHYLOCOCCUS CAPITIS SUSCEPTIBILITIES PERFORMED ON PREVIOUS CULTURE WITHIN THE LAST 5 DAYS. Performed at [facility], [place] [address] [place], [place] [address]    Report Status 05/10/2021 FINAL  Final  Blood Culture ID Panel (Reflexed)     Status: Abnormal   Collection Time: 05/07/21 12:30 PM  Result Value Ref Range Status   Enterococcus faecalis NOT DETECTED NOT DETECTED Final   Enterococcus Faecium NOT DETECTED NOT DETECTED Final   Listeria monocytogenes NOT DETECTED NOT DETECTED Final   Staphylococcus species DETECTED (A) NOT DETECTED Final    Comment: CRITICAL RESULT CALLED TO, READ BACK BY AND VERIFIED WITH: Vena Austria E.UELAND ON  [id number] AT 1424 BY E.PARRISH    Staphylococcus aureus (BCID) NOT DETECTED NOT DETECTED Final   Staphylococcus epidermidis DETECTED (A) NOT DETECTED Final    Comment: Methicillin (oxacillin) resistant coagulase negative staphylococcus. Possible blood culture contaminant (unless isolated from more than one blood culture draw or clinical case suggests pathogenicity). No antibiotic treatment is indicated for blood  culture contaminants. CRITICAL RESULT CALLED TO, READ BACK BY AND VERIFIED WITH: Vena Austria E.UELAND ON [id number] AT 6387 BY E.PARRISH    Staphylococcus lugdunensis NOT DETECTED NOT DETECTED Final   Streptococcus species NOT DETECTED NOT DETECTED Final   Streptococcus agalactiae NOT DETECTED NOT DETECTED Final   Streptococcus pneumoniae NOT DETECTED NOT DETECTED Final   Streptococcus pyogenes NOT DETECTED NOT DETECTED Final   A.calcoaceticus-baumannii NOT DETECTED NOT DETECTED Final   Bacteroides fragilis NOT DETECTED NOT DETECTED Final   Enterobacterales NOT DETECTED NOT DETECTED Final   Enterobacter cloacae complex NOT DETECTED NOT DETECTED Final   Escherichia coli NOT DETECTED NOT DETECTED Final   Klebsiella aerogenes NOT DETECTED NOT DETECTED Final   Klebsiella oxytoca NOT DETECTED NOT DETECTED Final   Klebsiella pneumoniae NOT DETECTED NOT DETECTED Final   Proteus species NOT DETECTED NOT DETECTED Final   Salmonella species NOT DETECTED NOT DETECTED Final   Serratia marcescens NOT DETECTED NOT DETECTED Final   Haemophilus influenzae NOT DETECTED NOT DETECTED Final   Neisseria meningitidis NOT DETECTED NOT DETECTED Final   Pseudomonas aeruginosa NOT DETECTED NOT DETECTED Final   Stenotrophomonas maltophilia NOT DETECTED NOT DETECTED Final   Candida albicans NOT DETECTED NOT DETECTED Final   Candida auris NOT DETECTED NOT DETECTED Final   Candida glabrata NOT DETECTED NOT DETECTED Final   Candida krusei NOT DETECTED NOT DETECTED Final   Candida parapsilosis NOT DETECTED NOT  DETECTED Final   Candida tropicalis NOT  DETECTED NOT DETECTED Final   Cryptococcus neoformans/gattii NOT DETECTED NOT DETECTED Final   Methicillin resistance mecA/C DETECTED (A) NOT DETECTED Final    Comment: CRITICAL RESULT CALLED TO, READ BACK BY AND VERIFIED WITH: Nevin Bloodgood ON [id number] AT 1424 BY E.PARRISH Performed at [facility], [place] [address], [place], [place] [address]   Culture, blood (routine x 2)     Status: Abnormal   Collection Time: 05/07/21 12:37 PM   Specimen: BLOOD RIGHT HAND  Result Value Ref Range Status   Specimen Description BLOOD RIGHT HAND  Final   Special Requests   Final    BOTTLES DRAWN AEROBIC ONLY Blood Culture adequate volume   Culture  Setup Time   Final    GRAM POSITIVE COCCI IN CLUSTERS AEROBIC BOTTLE ONLY CRITICAL VALUE NOTED.  VALUE IS CONSISTENT WITH PREVIOUSLY REPORTED AND CALLED VALUE. Performed at [facility], [place] [address] [place], [place] [address]    Culture STAPHYLOCOCCUS CAPITIS (A)  Final   Report Status 05/10/2021 FINAL  Final   Organism ID, Bacteria STAPHYLOCOCCUS CAPITIS  Final      Susceptibility   Staphylococcus capitis - MIC*    CIPROFLOXACIN >=8 RESISTANT Resistant     ERYTHROMYCIN >=8 RESISTANT Resistant     GENTAMICIN 8 INTERMEDIATE Intermediate     OXACILLIN >=4 RESISTANT Resistant     TETRACYCLINE 2 SENSITIVE Sensitive     VANCOMYCIN 1 SENSITIVE Sensitive     TRIMETH/SULFA <=10 SENSITIVE Sensitive     CLINDAMYCIN >=8 RESISTANT Resistant     RIFAMPIN <=0.5 SENSITIVE Sensitive     Inducible Clindamycin NEGATIVE Sensitive     * STAPHYLOCOCCUS CAPITIS  Culture, blood (routine x 2)     Status: None   Collection Time: 05/09/21 10:45 AM   Specimen: BLOOD RIGHT WRIST  Result Value Ref Range Status   Specimen Description BLOOD RIGHT WRIST  Final   Special Requests   Final    BOTTLES DRAWN AEROBIC ONLY Blood Culture results may not be optimal due to an inadequate volume of blood received in culture bottles    Culture   Final    NO GROWTH 5 DAYS Performed at [facility], [place] [address] [place], [place] [address]    Report Status 05/14/2021 FINAL  Final  Culture, blood (routine x 2)     Status: None   Collection Time: 05/09/21 10:46 AM   Specimen: BLOOD RIGHT HAND  Result Value Ref Range Status   Specimen Description BLOOD RIGHT HAND  Final   Special Requests   Final    BOTTLES DRAWN AEROBIC AND ANAEROBIC Blood Culture adequate volume   Culture   Final    NO GROWTH 5 DAYS Performed at [facility], [place] [address] [place], [place] [address]    Report Status 05/14/2021 FINAL  Final  Culture, blood (routine x 2)     Status: None   Collection Time: 06/06/21  6:25 AM   Specimen: BLOOD RIGHT HAND  Result Value Ref Range Status   Specimen Description BLOOD RIGHT HAND  Final   Special Requests   Final    BOTTLES DRAWN AEROBIC AND ANAEROBIC Blood Culture adequate volume   Culture   Final    NO GROWTH 5 DAYS Performed at [facility], [place] [address] [place], [place] [address]    Report Status 06/11/2021 FINAL  Final  Culture, blood (routine x 2)     Status: None   Collection  Time: 06/06/21  6:38 AM   Specimen: BLOOD RIGHT HAND  Result Value Ref Range Status   Specimen Description BLOOD RIGHT HAND  Final   Special Requests   Final    BOTTLES DRAWN AEROBIC AND ANAEROBIC Blood Culture adequate volume   Culture   Final    NO GROWTH 5 DAYS Performed at [facility], [address], [place], [place] [address]    Report Status 06/11/2021 FINAL  Final  Culture, Respiratory w Gram Stain     Status: None   Collection Time: 06/06/21  9:00 AM   Specimen: Tracheal Aspirate; Respiratory  Result Value Ref Range Status   Specimen Description TRACHEAL ASPIRATE  Final   Special Requests NONE  Final   Gram Stain   Final    MODERATE WBC PRESENT,BOTH PMN AND MONONUCLEAR MODERATE GRAM POSITIVE RODS MODERATE GRAM NEGATIVE RODS RARE GRAM POSITIVE COCCI IN CHAINS    Culture    Final    FEW Consistent with normal respiratory flora. No Pseudomonas species isolated Performed at [place] [address], [place], [place] [address]    Report Status 06/08/2021 FINAL  Final  Culture, blood (Routine X 2) w Reflex to ID Panel     Status: None   Collection Time: 06/13/21  8:58 AM   Specimen: BLOOD RIGHT HAND  Result Value Ref Range Status   Specimen Description BLOOD RIGHT HAND  Final   Special Requests   Final    BOTTLES DRAWN AEROBIC AND ANAEROBIC Blood Culture results may not be optimal due to an inadequate volume of blood received in culture bottles   Culture   Final    NO GROWTH 5 DAYS Performed at [facility], [place] [address], [place], [place] [address]    Report Status 06/18/2021 FINAL  Final  Culture, blood (single) w Reflex to ID Panel     Status: None   Collection Time: 06/17/21 11:13 AM   Specimen: BLOOD LEFT ARM  Result Value Ref Range Status   Specimen Description BLOOD LEFT ARM  Final   Special Requests   Final    BOTTLES DRAWN AEROBIC AND ANAEROBIC Blood Culture adequate volume   Culture   Final    NO GROWTH 5 DAYS Performed at [facility], [place] [address] [place], [place] [address]    Report Status 06/22/2021 FINAL  Final    Labs: CBC: Recent Labs  Lab 08/02/21 0317 08/06/21 0457  WBC 6.2 8.3  HGB 13.0 13.1  HCT 39.5 39.4  MCV 93.2 91.8  PLT 271 932   Basic Metabolic Panel: Recent Labs  Lab 08/02/21 0317 08/06/21 0457  NA 135 135  K 4.7 4.8  CL 99 99  CO2 30 28  GLUCOSE 107* 105*  BUN 39* 37*  CREATININE 0.67 0.72  CALCIUM 9.0 9.0  MG 2.1 1.8  PHOS 3.4  --    Liver Function Tests: No results for input(s): AST, ALT, ALKPHOS, BILITOT, PROT, ALBUMIN in the last 168 hours. CBG: No results for input(s): GLUCAP in the last 168 hours.  Discharge time spent: greater than 30 minutes.  Signed: Flora Lipps, MD [facility] 08/07/2021

## 2021-08-07 NOTE — TOC Progression Note (Signed)
Transition of Care [facility]) - Progression Note    Patient Details  Name: Billy Cherry MRN: [id number] Date of Birth: 1971/02/10  Transition of Care [facility]) CM/SW Contact  Janae Bridgeman, RN Phone Number: 08/07/2021, 1:46 PM  Clinical Narrative:    CM called and spoke with the patient's wife, Theola Sequin, and she was informed that the patient was initially accepted for admission to [facility] facility and insurance authorization was completed - but the facility declined the bed offer due to inability to provide frequent suctioning.  The wife was updated that CM and MSW with DTP Team will continue to follow the patient for needed SNF placement and the patient's wife is agreeable.  I called and spoke with Jess Barters, Admissions director with [facility] and they are continuing to recruit for nursing care at the home at a later date but at this time they have been unable to hire 9 hrs/week for private duty nursing at this time.  CM and MSW with DTP Team will continue to follow the patient for needed SNF placement.   Expected Discharge Plan: Home w Home Health Services Barriers to Discharge: Other (must enter comment) (Patient's wife - requested private duty services through alternative provider for 12 hours shifts for staffing at the home.)  Expected Discharge Plan and Services Expected Discharge Plan: Home w Home Health Services In-house Referral: Clinical Social Work Discharge Planning Services: CM Consult, Follow-up appt scheduled Post Acute Care Choice: Home Health Living arrangements for the past 2 months: Single Family Home Expected Discharge Date: 08/07/21               DME Arranged: Janina Mayo supplies, Tube feeding, Suction, Hospital bed, Oxygen DME Agency: [employer]       HH Arranged: RN (Private Duty Nursing through [employer] versus [place]) HH Agency: [employer] [facility] Date HH Agency Contacted: 07/04/21 Time HH Agency Contacted:  1000 Representative spoke with at [facility] Agency: Jeanmarie Plant, Director with [employer] [phone]   Social Determinants of Health (SDOH) Interventions    Readmission Risk Interventions    04/14/2021   11:14 AM  Readmission Risk Prevention Plan  Transportation Screening Complete  HRI or Home Care Consult Complete  Social Work Consult for Recovery Care Planning/Counseling Complete  Palliative Care Screening Complete  Medication Review Oceanographer) Complete

## 2021-08-07 NOTE — Progress Notes (Signed)
NAME:  Billy Cherry, MRN:  [id number], DOB:  08-13-1970, LOS: 130 ADMISSION DATE:  03/30/2021, CONSULTATION DATE:  1/19 REFERRING MD:  Evette Georges, CHIEF COMPLAINT:  Found down, cardiac arrest   Brief Pt Description / Synopsis:  51 year old male with out-of-hospital V. fib cardiac arrest in setting of respiratory arrest due to suspected angioedema from ACE inhibitor leading to asphyxiation.  Concern on his 12 lead EKG for anterolateral ST elevation so he was taken to the cath lab where his study showed no significant coronary artery disease and an LVEF < 20%  Now with anoxic brain injury.  Pertinent  Medical History  Hypertension  Significant Hospital Events: Including procedures, antibiotic start and stop dates in addition to other pertinent events   1/19 admission, left heart cath without significant CAD.  Concern for anoxic brain injury 1/20: MRI pending, Neuro following 1/21: MRI shows areas of hypoxic-ischemic injury bilaterally. Fevered; cultures obtained. 1/23 severe brain damage 1/24 evidence of severe brain damage, remains critically ill 1/25: Plan for PEG today. Neuro exam unchanged.  Does exhibit new eyelid tremor like movements and lip/jaw tremor like vs clonus movement.  Bolused Keppra and increased maintenance dose.  Repeat EEG. CTH: concerning for progression of anoxic injury.  1/26: Repeat EEG unchanged and continues to show myoclonic seizures despite addition of Valproic acid yesterday. transfer to [facility] for continuous EEG 1/28: Continues to have myoclonic sz on EEG 1/29 No acute issues overnight 2/3 no meaningful neuro recovery. Diuresed 2/5 unchanged 2/6 New fever to 102, WBC 16K - treated with vancomycin for 7d based on cultures.  2/11: percutaneous tracheostomy 2/13 >2/20 on trach collar. No vent.  2/20 Trach change to 6 cuffless 2/27 status unchanged 3/13 unchanged 3/20 due for trach exchange given continue secretions (although improved) will stick to 6 cuffless  for exchange  06/05/2021 Continued thick  secretions, scant blood tinged 4/3/am 06/06/2021 CT of the chest with new PE and haziness consistent with pneumonia 5/30 continue tube feeding/free water  Interim History / Subjective:  No changes.   Objective   Blood pressure 115/79, pulse 92, temperature 99.3 F (37.4 C), temperature source Oral, resp. rate (!) 22, height 5\' 10"  (1.778 m), weight 97 kg, SpO2 100 %.    FiO2 (%):  [28 %] 28 %   Intake/Output Summary (Last 24 hours) at 08/07/2021 0950 Last data filed at 08/07/2021 0644 Gross per 24 hour  Intake 1055 ml  Output 1600 ml  Net -545 ml     Filed Weights   08/05/21 0404 08/06/21 0431 08/07/21 0505  Weight: 95 kg 94 kg 97 kg   Examination:  General: ill appearing man lying in bed in NAD HEENT: [place]/AT, eyes anicteric Neck: trach in place, uncuffed #6 shiley, no bleeding or drainage from stoma Resp: mild rhonchi bilaterally, able to expectorate secretions via trach, on TC Cardiac: S1S2, RRR Neuro: eyes open spontaneously, no response to stimulation during exam, strong cough Abd: PEG, mild bleeding from lower edge of stoma but no erythema or fluctuance  6/4 labs reviewed. No recent cultures or imaging.  Ancillary tests personally reviewed:     Assessment & Plan:  Out of hospital V-fib arrest Postarrest anoxic brain injury Myoclonus Respiratory failure with prolonged mechanical ventilation Tracheostomy dependent Cardiomyopathy AKI Pulmonary embolism Hypertension  -Trach care per protocol; not a candidate for decannulation unless mental status improves.  -Con't eliquis; anticipate he will need indefinitely -Overall prognosis remains poor unfortunately.    PCCM will continue to follow weekly.  10/07/21  Chestine Spore, MD 08/07/21 10:19 AM [facility]

## 2021-08-08 MED ORDER — HYDRALAZINE HCL 25 MG PO TABS
75.0000 mg | ORAL_TABLET | Freq: Three times a day (TID) | ORAL | Status: DC
  Administered 2021-08-08 – 2021-08-14 (×18): 75 mg
  Filled 2021-08-08 (×18): qty 1

## 2021-08-08 NOTE — Progress Notes (Addendum)
1:30pm: Patient was discussed at [facility] meetings with [facility] Leadership.  11:30am: CSW was notified that the patient can be accepted into the facility once his spouse agrees to placement.  10:30am: CSW was notified by Malena Peer at [facility] in [place] that the patient has been accepted. Malena Peer to initiate change with insurance company and notify CSW that change has been made so patient can discharge.  Edwin Dada, MSW, LCSW Transitions of Care  Clinical Social Worker II [phone]

## 2021-08-08 NOTE — Progress Notes (Signed)
Nutrition Follow-up  DOCUMENTATION CODES:   Not applicable  INTERVENTION:  Continue tube feeding via PEG:  1.5 cartons of Osmolite 1.5 - QID (355 mL each bolus) 45 mL ProSource TF given TID 300 mL free water q4h  Provides 2250 kcal, 122 gm PRO, and 1086 mL free water (2879 mL total free water) daily.    Continue Juven (or equivalent) BID per tube, each packet provides 80 calories, 8 grams of carbohydrate, 2.5 grams of protein to promote wound healing  NUTRITION DIAGNOSIS:   Inadequate oral intake related to inability to eat as evidenced by NPO status; ongoing  GOAL:   Patient will meet greater than or equal to 90% of their needs; met with TF  MONITOR:   Labs, Weight trends, TF tolerance, Skin, I & O's  REASON FOR ASSESSMENT:   Ventilator, Consult Enteral/tube feeding initiation and management  ASSESSMENT:   51 year old male who originally presented to [facility] on 1/19 after out-of-hospital cardiac arrest due to suspected angioedema from ACE inhibitor leading to asphyxiation. PMH of HTN, HLD.  2/11 - tracheostomy placed 2/22 - PEG placed   Pt continues on trach collar. Wife requesting pt to discharge home with private duty nurse. Wife refusing SNF facility bed offer. Recommend continuation of current tube feeding regimen. Labs and medications reviewed.   Diet Order:   Diet Order     None       EDUCATION NEEDS:   Not appropriate for education at this time  Skin:  Skin Assessment: Reviewed RN Assessment Skin Integrity Issues:: Unstageable DTI: N/A Stage II: R foot Stage III: N/A Unstageable: R heel Other: MASD Buttocks  Last BM:  6/4  Height:   Ht Readings from Last 1 Encounters:  04/18/21 _0  (1.778 m)    Weight:   Wt Readings from Last 1 Encounters:  08/08/21 98 kg   BMI:  Body mass index is 31 kg/m.  Estimated Nutritional Needs:   Kcal:  2200-2400  Protein:  110-125 grams  Fluid:  >/= 2.2 L  Corrin Parker, MS, RD, LDN RD pager  number/after hours weekend pager number on Amion.

## 2021-08-08 NOTE — TOC Progression Note (Addendum)
Transition of Care  [facility]) - Progression Note    Patient Details  Name: Billy Cherry MRN: [id number] Date of Birth: Apr 23, 1970  Transition of Care [facility]) CM/SW Contact  Janae Bridgeman, RN Phone Number: 08/08/2021, 10:57 AM  Clinical Narrative:    CM called and spoke with the patient's wife, Theola Sequin, on the phone to discuss patient's acceptance at [facility] and the wife states that she refuses the bed offer to the facility and that she is unable to visit the patient due to the distance from her home.  The patient's wife states that she is unwilling sign the patient into the facility and demands that the patient remain in the hospital until [facility] is able to provide private duty nursing for the patient.  I explained to the patient's wife, Colette Ribas that the patient is medically stable for discharge and she states that she is not agreeable to a nursing facility for care at this time.  Edwin Dada, MSW is aware and hospital legal team will be contacted at this time.  Grandville Silos, [facility] Supervisor was notified of the wife's refusal for placement at [facility] and he will follow up regarding communication with the patient's wife on the phone.    I called to speak with the patient's wife on the phone to further discuss the next steps in regards to her refusal for safe discharge plan and I was unable to reach her by phone nor leave a voicemail message.  08/08/2021 1342 - I called and spoke with the patient's spouse, Theola Sequin on the phone and updated her that [facility] was the only accepting facility that was able to offer SNF placement to the patient.  The patient's wife continued to explain that she refuses placement at the facility.  I explained to the patient that placement would be for short term rehabilitation services until [employer] was able to hire RN/LPN for private duty services in the home but the  wife states that she "will not accept the offer and have the facility provide care".  I explained to the patient's wife that once the hospital has offered a safe discharge plan to the wife and he is medically stable to discharge - then the insurance provider may likely deny coverage for the hospital stay and if the wife refuses transfer to the facility - she may likely be held responsible for the hospital charges at this point.  The patient's wife also refused patient's return home to the wife's care in the home until private duty services have been arranged or acceptable SNF facility was established.  Hospital legal team will be notified at this point and the patient's wife was made aware and understands that she will be taking financial responsibility. Hospital leadership is aware of the wife's refusal for placement.  Grandville Silos, [facility] Supervisor was notified of the above note.  08/08/2021 1410 - I called and spoke with Jess Barters, Admissions Director with [facility] in [place] and they currently have no available licensed staff (RN/LPN) to private duty in the home at this time. The patient's wife is aware of this and Jess Barters states that he recently spoke with [name] and she was notified that [employer] was unable to provide services in the home due to lack of staffing availability.  TOC team will continue to follow the patient for [facility] needs - patient at acceptable bed offer at [facility] but the patient's wife  is refusing placement to the facility [facility] leadership is aware and will provide DTP Team with follow up recommendations.   Expected Discharge Plan: Home w Home Health Services Barriers to Discharge: Other (must enter comment) (Patient's wife - requested private duty services through alternative provider for 12 hours shifts for staffing at the home.)  Expected Discharge Plan and Services Expected Discharge Plan: Home w Home Health Services In-house  Referral: Clinical Social Work Discharge Planning Services: CM Consult, Follow-up appt scheduled Post Acute Care Choice: Home Health Living arrangements for the past 2 months: Single Family Home Expected Discharge Date: 08/07/21               DME Arranged: Janina Mayo supplies, Tube feeding, Suction, Hospital bed, Oxygen DME Agency: [employer]       HH Arranged: RN (Private Duty Nursing through [employer] versus [name]) HH Agency: [employer] [facility] Date HH Agency Contacted: 07/04/21 Time HH Agency Contacted: 1000 Representative spoke with at [facility] Agency: Jeanmarie Plant, Director with [employer] [phone]   Social Determinants of Health (SDOH) Interventions    Readmission Risk Interventions    04/14/2021   11:14 AM  Readmission Risk Prevention Plan  Transportation Screening Complete  HRI or Home Care Consult Complete  Social Work Consult for Recovery Care Planning/Counseling Complete  Palliative Care Screening Complete  Medication Review Oceanographer) Complete

## 2021-08-08 NOTE — Consult Note (Addendum)
WOC Nurse wound follow up Previously noted pressure injury to right heel has resolved. Intact skin to the location. Right plantar foot with full thickness wound where loose sheets of skin is peeling revealing pink moist woundbed, 3X3X.2cm.  Dressing procedure/placement/frequency: Continue present plan of care as follows: Foam dressing to protect and promote healing, change Q 3 days or PRN soiling.  Prevalon boots in place to reduce pressure.  Please re-consult if further assistance is needed.  Thank-you,  Cammie Mcgee MSN, RN, CWOCN, [place], CNS [phone]

## 2021-08-08 NOTE — Progress Notes (Signed)
PROGRESS NOTE    Billy Cherry  [id number] DOB: 1970/04/02 DOA: 03/30/2021 PCP: Patient, No Pcp Per (Inactive)    Brief Narrative:  Patient is a 51 years old male with past medical history of hypertension who was brought into the hospital after being found unresponsive by his wife.  Patient was initially admitted to [facility] hospital for out of hospital cardiac arrest and ventricular fibrillation with respiratory arrest suspected from angioedema from ACE inhibitor leading to asphyxiation.  Patient was then subsequently transferred to [facility].  Below is the Sequence of events during hospitalization,  1/19 left heart cath without significant CAD.  Concern for anoxic brain injury 1/21: MRI shows areas of hypoxic-ischemic injury bilaterally. Fevered; cultures obtained. 1/23 severe brain damage 1/25: PEG . Neuro exam unchanged.  Does exhibit new eyelid tremor like movements and lip/jaw tremor like vs clonus movement.  Bolused Keppra and increased maintenance dose.  Repeat EEG. CTH: concerning for progression of anoxic injury.  1/26: Repeat EEG unchanged and continues to show myoclonic seizures despite addition of Valproic acid yesterday.  03/30/2021 Transfer to [name] from [place] for continuous EEG 1/28: Continues to have myoclonic sz on EEG 2/3 no meaningful neuro recovery. Diuresed 2/6 New fever to 102, WBC 16K - treated with vancomycin for 7d based on cultures.  2/11: percutaneous tracheostomy 2/13 >2/20 on trach collar. No vent.  2/20 Trach change to 6 cuffless 2/28: fever 3/13: eliquis started 4/3: fever 06/06/2021 CT of the chest with new PE and haziness consistent with pneumonia 4/5-4/9: responding well to PRN lasix 4/11 fever again after completion of abx- work up in progress 4/13 continues to have low-grade temps cultures from 4/11 no growth to date-suspect silent/occult aspiration-heparin changed back to Eliquis 4/13 4/14: Work-up negative with no growth to date  but red menance Tmax elevated 99.4 and respiratory rate up-x-ray did not show         any pneumonia but probably silently aspirating 08/04/21- ongoing.  Patient remains unresponsive.  Awaiting for placement and disposition has been difficult.   Assessment & Plan: Principal Problem:   Cardiac arrest [facility]) Active Problems:   Anoxic encephalopathy ([name])   Acute respiratory failure ([name])   Dysphagia   DVT (deep venous thrombosis) ([name])   Myoclonus   HFrEF (heart failure with reduced ejection fraction) ([name])   HTN (hypertension)   Pressure injury of skin   Pulmonary embolus ([name])   S/P Cardiac arrest: Patient was admitted with out-of-hospital cardiac arrest.  MRI brain showed hypoxic brain injury.  2D echocardiogram showed LV ejection fraction of less than 20% with global hypokinesis.  Patient had undergone a cardiac catheterization.  Currently compensated.  Continue hydralazine, isosorbide, spironolactone on discharge..  Patient has overall poor prognosis.  Continue tracheostomy and PEG tube care.   Anoxic encephalopathy: Neurologically has not improved.  Status post tracheostomy and PEG tube placement.  Continue supportive care.  Continue Klonopin,  Keppra and valproic acid.  High risk for aspiration.  Poor prognosis.   Acute hypoxic respiratory failure: S/p trach, continue trach collar.  Pulmonary has been following the patient during hospitalization.   HFr EF(chronic systolic heart failure)  2D echo from 03/23/2021 with LVEF less than 20%.  Continue carvedilol, isosorbide/hydralazine and spironolactone.  Appears compensated at this time   RLL pneumonia: Completed multiple courses of antibiotic during hospitalization.  Clinically improved at this time.   DVT /pulmonary embolism. Lower extremity duplex on 05/15/21 showed age-indeterminate deep venous thrombosis involving left femoral vein, left proximal profunda  vein, left posterior tibial veins, left popliteal vein, left peroneus veins.  CT scan of chest with possible acute on chronic PE.  Continue  Eliquis.  Tolerating so far.   Dysphagia: Post PEG tube placement and on tube feeding. Tolerating tube feeds.  Essential hypertension: Continue amlodipine, carvedilol, hydralazine, isosorbide and spironolactone.  Elevated diastolic blood pressure settings.  Will increase hydralazine dose from 50 mg to from 50 mg from 50 mg from 50 mg to 75 mg 3 times daily.  Hypernatremia: Had improved with free water flushes.  Sodium of 135 at this time.  On free water flushes 200 mL every 6 hourly.  Myoclonus: Seen by neurology during hospitalization continue Klonopin, Keppra. Per neuro , suppressing his myoclonus will not ultimately change the prognosis.   Pressure injury of the skin: Lateral penis, posterior right heel, right foot, not present on admission.  Will need continued wound care.     DVT prophylaxis:  apixaban (ELIQUIS) tablet 5 mg   Code Status:     Code Status: Full Code  Disposition: Uncertain at this time  Status is: Inpatient  Remains inpatient appropriate because: Significant needs, limitation with home care/nursing regimen at disposition, long length of stay, disposition issues.   Family Communication:  None.  Consultants:  PCCM  Procedures:  Tracheostomy PEG tube  Antimicrobials:  None currently  Subjective: Today, patient was seen and examined at bedside.  No interval complaints reported.  No change in his mentation.  Objective: Vitals:   08/08/21 0318 08/08/21 0349 08/08/21 0706 08/08/21 0817  BP:  (!) 117/93 (!) 138/104 (!) 141/108  Pulse: (!) 105 100 93 94  Resp: (!) 26 (!) 25 14 (!) 26  Temp:  99 F (37.2 C) 99.2 F (37.3 C)   TempSrc:  Axillary Oral   SpO2: 99% 99% 100% 100%  Weight:  98 kg    Height:        Intake/Output Summary (Last 24 hours) at 08/08/2021 0935 Last data filed at 08/08/2021 0349 Gross per 24 hour  Intake --  Output 1700 ml  Net -1700 ml   Filed Weights    08/06/21 0431 08/07/21 0505 08/08/21 0349  Weight: 94 kg 97 kg 98 kg    Physical Examination: Body mass index is 31 kg/m.   General: Obese built, not in obvious distress, appears chronically ill and deconditioned HENT:   No scleral pallor or icterus noted. Oral mucosa is moist.  Tracheostomy in in place Chest:  Diminished breath sounds bilaterally.  CVS: S1 &S2 heard. No murmur.  Regular rate and rhythm. Abdomen: Soft, nontender, nondistended.  Bowel sounds are heard.  PEG tube in place. Extremities: No cyanosis, clubbing or edema.   Psych: Opens eyes spontaneously, nonverbal, CNS: Opens eyes spontaneously, nonverbal, hypotonic, noninteractive, does not follow commands Skin: Warm and dry.    Data Reviewed:   CBC: Recent Labs  Lab 08/02/21 0317 08/06/21 0457  WBC 6.2 8.3  HGB 13.0 13.1  HCT 39.5 39.4  MCV 93.2 91.8  PLT 271 [id number]    Basic Metabolic Panel: Recent Labs  Lab 08/02/21 0317 08/06/21 0457  NA 135 135  K 4.7 4.8  CL 99 99  CO2 30 28  GLUCOSE 107* 105*  BUN 39* 37*  CREATININE 0.67 0.72  CALCIUM 9.0 9.0  MG 2.1 1.8  PHOS 3.4  --     Liver Function Tests: No results for input(s): AST, ALT, ALKPHOS, BILITOT, PROT, ALBUMIN in the last 168 hours.   Radiology Studies: No  results found.    LOS: 131 days    Flora Lipps, MD [facility] Available via Epic secure chat 7am-7pm After these hours, please refer to coverage provider listed on [email or web address] 08/08/2021, 9:35 AM

## 2021-08-09 NOTE — Progress Notes (Signed)
PROGRESS NOTE    Billy Cherry  [id number] DOB: Aug 03, 1970 DOA: 03/30/2021 PCP: Patient, No Pcp Per (Inactive)   Brief Narrative: Patient is a 51 years old male with past medical history of hypertension who was brought into the hospital after being found unresponsive by his wife.  Patient was initially admitted to [facility] hospital for out of hospital cardiac arrest and ventricular fibrillation with respiratory arrest suspected from angioedema from ACE inhibitor leading to asphyxiation.  Patient was then subsequently transferred to [facility].  Below is the Sequence of events during hospitalization,  1/19 left heart cath without significant CAD.  Concern for anoxic brain injury 1/21: MRI shows areas of hypoxic-ischemic injury bilaterally. Fevered; cultures obtained. 1/23 severe brain damage 1/25: PEG . Neuro exam unchanged.  Does exhibit new eyelid tremor like movements and lip/jaw tremor like vs clonus movement.  Bolused Keppra and increased maintenance dose.  Repeat EEG. CTH: concerning for progression of anoxic injury.  1/26: Repeat EEG unchanged and continues to show myoclonic seizures despite addition of Valproic acid yesterday.  03/30/2021 Transfer to [name] from [place] for continuous EEG 1/28: Continues to have myoclonic sz on EEG 2/3 no meaningful neuro recovery. Diuresed 2/6 New fever to 102, WBC 16K - treated with vancomycin for 7d based on cultures.  2/11: percutaneous tracheostomy 2/13 >2/20 on trach collar. No vent.  2/20 Trach change to 6 cuffless 2/28: fever 3/13: eliquis started 4/3: fever 06/06/2021 CT of the chest with new PE and haziness consistent with pneumonia 4/5-4/9: responding well to PRN lasix 4/11 fever again after completion of abx- work up in progress 4/13 continues to have low-grade temps cultures from 4/11 no growth to date-suspect silent/occult aspiration-heparin changed back to Eliquis 4/13 4/14: Work-up negative with no growth to date  but red menance Tmax elevated 99.4 and respiratory rate up-x-ray did not show         any pneumonia but probably silently aspirating 08/04/21- ongoing.  Patient remains unresponsive.  Awaiting for placement and disposition has been difficult   Assessment and Plan:  S/p Cardiac arrest Postarrest anoxic encephalopathy Postarrest respiratory failure s/p tracheostomy S/P PEG tube placement 2D echocardiogram showed reduced LV function at <20% with global hypokinesis.  Now status post trach and PEG.  Remains obtunded with anoxic encephalopathy. -Continue Keppra  Anoxic encephalopathy ([place]) MRI brain significant for hypoxic brain injury. Continue supportive care. Patient currently keeps his eyes open, but does not answer questions or follow commands. Unchanged neurologic status   Acute respiratory failure ([name]) Status post trach collar.  Continue supportive care   RLL pneumonia Previously had completed vancomycin for MSSA and [name] in early march.  Received more than 7-day course of IV Fortaz. On 4/3, CT chest was ordered that indicated an area of alveolar infiltration that may represent pneumonia.  Tracheal aspirate has been sent for culture and is significant for normal respiratory flora. Concerning for aspiration. Completed course of Zosyn.   DVT (deep venous thrombosis) ([name]) Duplex ultrasound of lower extremity on 05/15/2021 showed age indeterminate deep vein thrombosis involving the left femoral vein, left proximal profunda vein, left posterior tibial veins, left popliteal vein, and left peroneal veins. Initially on Eliquis but now changed to Lovenox -Continue Lovenox   Dysphagia Continue PEG tube feeding.  Tolerating but high risk of aspiration -Aspiration precautions   Pulmonary embolus ([name]) Noted to have possible acute on chronic pulmonary embolism on CT.  Also have some evidence of possible pulmonary hypertension.  Patient was  previously managed on Eliquis which  is transitioned to Lovenox -Continue Lovenox   Pressure injury of skin Lateral penis, posterior right heel, right foot. Not present on admission.   HTN (hypertension) Blood pressure elevated today. -Continue Coreg, Cardura, hydralazine and isosorbide dinitrate -PRN hydralazine   HFrEF (heart failure with reduced ejection fraction) ([name]) 2D echocardiogram-LVEF < 20%,  -Continue coreg, imdur, spironolactone   Myoclonus Seen by neurology.  Continue current AED with Klonopin, Keppra. Per neuro notes, suppressing his myoclonus will not ultimately change the prognosis    Hypernatremia-resolved Continue free water flushes  DVT prophylaxis: Lovenox Code Status:   Code Status: Full Code Family Communication: None at bedside Disposition Plan: Discharge home pending insurance, home health/services availability    Consultants:  Infectious disease Palliative care Neurology Cardiology Critical care  Procedures:  Tracheostomy PEG tube  Antimicrobials:     Subjective: Non-verbal  Objective: BP (!) 153/107 (BP Location: Left Arm)   Pulse 99   Temp 97.7 F (36.5 C) (Axillary)   Resp 20   Ht 5\' 10"  (1.778 m)   Wt 97.8 kg   SpO2 99%   BMI 30.94 kg/m   Examination:  General exam: Appears calm and comfortable PERRLA: Fixed and dilated Respiratory system: Clear to auscultation. Respiratory effort normal. Cardiovascular system: S1 & S2 heard, RRR. Gastrointestinal system: Abdomen is nondistended, soft and nontender. Normal bowel sounds heard. Central nervous system: eyes open spontaneously but does not follow commands. Musculoskeletal: No edema. No calf tenderness Skin: No cyanosis. No rashes   Data Reviewed: I have personally reviewed following labs and imaging studies  CBC Lab Results  Component Value Date   WBC 8.3 08/06/2021   RBC 4.29 08/06/2021   HGB 13.1 08/06/2021   HCT 39.4 08/06/2021   MCV 91.8 08/06/2021   MCH 30.5 08/06/2021   PLT 173 08/06/2021    MCHC 33.2 08/06/2021   RDW 14.0 08/06/2021   LYMPHSABS 1.7 06/22/2021   MONOABS 1.3 (H) 06/22/2021   EOSABS 0.2 06/22/2021   BASOSABS 0.0 [id number]     Last metabolic panel Lab Results  Component Value Date   NA 135 08/06/2021   K 4.8 08/06/2021   CL 99 08/06/2021   CO2 28 08/06/2021   BUN 37 (H) 08/06/2021   CREATININE 0.72 08/06/2021   GLUCOSE 105 (H) 08/06/2021   GFRNONAA >60 08/06/2021   CALCIUM 9.0 08/06/2021   PHOS 3.4 08/02/2021   PROT 6.2 (L) 07/15/2021   ALBUMIN 2.3 (L) 07/15/2021   BILITOT 0.7 07/15/2021   ALKPHOS 54 07/15/2021   AST 18 07/15/2021   ALT 12 07/15/2021   ANIONGAP 8 08/06/2021    GFR: Estimated Creatinine Clearance: 129.5 mL/min (by C-G formula based on SCr of 0.72 mg/dL).  No results found for this or any previous visit (from the past 240 hour(s)).     Radiology Studies: No results found.    LOS: 132 days    Cordelia Poche, MD [facility] 08/09/2021, 5:06 PM   If 7PM-7AM, please contact night-coverage [email or web address]

## 2021-08-09 NOTE — TOC Progression Note (Signed)
Transition of Care [facility]) - Progression Note    Patient Details  Name: Billy Cherry MRN: [id number] Date of Birth: [birth year]  [facility]) CM/SW Contact  Janae Bridgeman, RN Phone Number: 08/09/2021, 2:38 PM  Clinical Narrative:    CM called and spoke with Grandville Silos, [facility] Supervisor and asked that hospital leadership call and speak with the patient's wife on the phone to continue discussion regarding patient's placement at [facility] in [place], [place].  Shon Baton, [facility] Supervisor.    CM and MSW with DTP Team continue to follow the patient for SNF placement needs.   Expected Discharge Plan: Home w Home Health Services Barriers to Discharge: Other (must enter comment) (Patient's wife - requested private duty services through alternative provider for 12 hours shifts for staffing at the home.)  Expected Discharge Plan and Services Expected Discharge Plan: Home w Home Health Services In-house Referral: Clinical Social Work Discharge Planning Services: CM Consult, Follow-up appt scheduled Post Acute Care Choice: Home Health Living arrangements for the past 2 months: Single Family Home Expected Discharge Date: 08/07/21               DME Arranged: Janina Mayo supplies, Tube feeding, Suction, Hospital bed, Oxygen DME Agency: [employer]       HH Arranged: RN (Private Duty Nursing through [employer] versus [facility]) HH Agency: [employer] Services Date HH Agency Contacted: 07/04/21 Time HH Agency Contacted: 1000 Representative spoke with at [facility] Agency: Jeanmarie Plant, Director with [employer] [phone]   Social Determinants of Health (SDOH) Interventions    Readmission Risk Interventions    04/14/2021   11:14 AM  Readmission Risk Prevention Plan  Transportation Screening Complete  HRI or Home Care Consult Complete  Social Work Consult for Recovery Care Planning/Counseling Complete  Palliative Care Screening Complete   Medication Review [employer]) Complete

## 2021-08-10 NOTE — Progress Notes (Signed)
PROGRESS NOTE    Billy Cherry  [id number] DOB: [birth year] DOA: 03/30/2021 PCP: Patient, No Pcp Per (Inactive)   Brief Narrative: Patient is a 51 years old male with past medical history of hypertension who was brought into the hospital after being found unresponsive by his wife.  Patient was initially admitted to [facility] hospital for out of hospital cardiac arrest and ventricular fibrillation with respiratory arrest suspected from angioedema from ACE inhibitor leading to asphyxiation.  Patient was then subsequently transferred to [facility].  Below is the Sequence of events during hospitalization,  1/19 left heart cath without significant CAD.  Concern for anoxic brain injury 1/21: MRI shows areas of hypoxic-ischemic injury bilaterally. Fevered; cultures obtained. 1/23 severe brain damage 1/25: PEG . Neuro exam unchanged.  Does exhibit new eyelid tremor like movements and lip/jaw tremor like vs clonus movement.  Bolused Keppra and increased maintenance dose.  Repeat EEG. CTH: concerning for progression of anoxic injury.  1/26: Repeat EEG unchanged and continues to show myoclonic seizures despite addition of Valproic acid yesterday.  03/30/2021 Transfer to [name] from [place] for continuous EEG 1/28: Continues to have myoclonic sz on EEG 2/3 no meaningful neuro recovery. Diuresed 2/6 New fever to 102, WBC 16K - treated with vancomycin for 7d based on cultures.  2/11: percutaneous tracheostomy 2/13 >2/20 on trach collar. No vent.  2/20 Trach change to 6 cuffless 2/28: fever 3/13: eliquis started 4/3: fever 06/06/2021 CT of the chest with new PE and haziness consistent with pneumonia 4/5-4/9: responding well to PRN lasix 4/11 fever again after completion of abx- work up in progress 4/13 continues to have low-grade temps cultures from 4/11 no growth to date-suspect silent/occult aspiration-heparin changed back to Eliquis 4/13 4/14: Work-up negative with no growth to date  but red menance Tmax elevated 99.4 and respiratory rate up-x-ray did not show         any pneumonia but probably silently aspirating 08/04/21- ongoing.  Patient remains unresponsive.  Awaiting for placement and disposition has been difficult   Assessment and Plan:  S/p Cardiac arrest Postarrest anoxic encephalopathy Postarrest respiratory failure s/p tracheostomy S/P PEG tube placement 2D echocardiogram showed reduced LV function at <20% with global hypokinesis.  Now status post trach and PEG.  Remains obtunded with anoxic encephalopathy. -Continue Keppra  Anoxic encephalopathy ([place]) MRI brain significant for hypoxic brain injury. Continue supportive care. Patient currently keeps his eyes open, but does not answer questions or follow commands. Unchanged neurologic status   Acute respiratory failure ([name]) Status post trach collar.  Continue supportive care   RLL pneumonia, resolved Previously had completed vancomycin for MSSA and [name] in early march.  Received more than 7-day course of IV Fortaz. On 4/3, CT chest was ordered that indicated an area of alveolar infiltration that may represent pneumonia.  Tracheal aspirate has been sent for culture and is significant for normal respiratory flora. Concerning for aspiration. Completed course of Zosyn.   DVT (deep venous thrombosis) ([name]) Duplex ultrasound of lower extremity on 05/15/2021 showed age indeterminate deep vein thrombosis involving the left femoral vein, left proximal profunda vein, left posterior tibial veins, left popliteal vein, and left peroneal veins. -Continue Eliquis   Dysphagia Continue PEG tube feeding.  Tolerating but high risk of aspiration -Aspiration precautions   Pulmonary embolus ([name]) Noted to have possible acute on chronic pulmonary embolism on CT.  Also have some evidence of possible pulmonary hypertension.  Patient was previously managed on Eliquis which is transitioned  to Lovenox -Continue  Eliquis   Pressure injury of skin Lateral penis, posterior right heel, right foot. Not present on admission.   HTN (hypertension) Blood pressure elevated today. -Continue Coreg, Cardura, hydralazine and isosorbide dinitrate -PRN hydralazine   HFrEF (heart failure with reduced ejection fraction) ([name]) 2D echocardiogram-LVEF < 20%,  -Continue coreg, imdur, spironolactone   Myoclonus Seen by neurology. Per neuro notes, suppressing his myoclonus will not ultimately change the prognosis. -Continue Keppra, Klonopin   Hypernatremia-resolved Continue free water flushes  DVT prophylaxis: Lovenox Code Status:   Code Status: Full Code Family Communication: None at bedside Disposition Plan: Discharge home pending insurance, home health/services availability    Consultants:  Infectious disease Palliative care Neurology Cardiology Critical care  Procedures:  Tracheostomy PEG tube  Antimicrobials:     Subjective: Non-verbal  Objective: BP 134/85 (BP Location: Left Arm)   Pulse 95   Temp 98.6 F (37 C) (Axillary)   Resp 19   Ht 5\' 10"  (1.778 m)   Wt 98 kg   SpO2 100%   BMI 31.00 kg/m   Examination:  General exam: Appears calm and comfortable PERRLA: Fixed and dilated Respiratory system: Clear to auscultation. Respiratory effort normal. Cardiovascular system: S1 & S2 heard, RRR. Gastrointestinal system: Abdomen is nondistended, soft and nontender. Normal bowel sounds heard. Central nervous system: eyes open spontaneously but does not follow commands. Musculoskeletal: No edema. No calf tenderness Skin: No cyanosis. No rashes   Data Reviewed: I have personally reviewed following labs and imaging studies  CBC Lab Results  Component Value Date   WBC 8.3 08/06/2021   RBC 4.29 08/06/2021   HGB 13.1 08/06/2021   HCT 39.4 08/06/2021   MCV 91.8 08/06/2021   MCH 30.5 08/06/2021   PLT 173 08/06/2021   MCHC 33.2 08/06/2021   RDW 14.0 08/06/2021   LYMPHSABS 1.7  06/22/2021   MONOABS 1.3 (H) 06/22/2021   EOSABS 0.2 06/22/2021   BASOSABS 0.0 [id number]     Last metabolic panel Lab Results  Component Value Date   NA 135 08/06/2021   K 4.8 08/06/2021   CL 99 08/06/2021   CO2 28 08/06/2021   BUN 37 (H) 08/06/2021   CREATININE 0.72 08/06/2021   GLUCOSE 105 (H) 08/06/2021   GFRNONAA >60 08/06/2021   CALCIUM 9.0 08/06/2021   PHOS 3.4 08/02/2021   PROT 6.2 (L) 07/15/2021   ALBUMIN 2.3 (L) 07/15/2021   BILITOT 0.7 07/15/2021   ALKPHOS 54 07/15/2021   AST 18 07/15/2021   ALT 12 07/15/2021   ANIONGAP 8 08/06/2021    GFR: Estimated Creatinine Clearance: 129.7 mL/min (by C-G formula based on SCr of 0.72 mg/dL).  No results found for this or any previous visit (from the past 240 hour(s)).     Radiology Studies: No results found.    LOS: 133 days    Cordelia Poche, MD [facility] 08/10/2021, 2:13 PM   If 7PM-7AM, please contact night-coverage [email or web address]

## 2021-08-11 NOTE — Progress Notes (Signed)
PROGRESS NOTE    Billy Cherry  [id number] DOB: [birth year] DOA: 03/30/2021 PCP: Patient, No Pcp Per (Inactive)   Brief Narrative: Patient is a 51 years old male with past medical history of hypertension who was brought into the hospital after being found unresponsive by his wife.  Patient was initially admitted to [facility] hospital for out of hospital cardiac arrest and ventricular fibrillation with respiratory arrest suspected from angioedema from ACE inhibitor leading to asphyxiation.  Patient was then subsequently transferred to [facility].  Below is the Sequence of events during hospitalization,  1/19 left heart cath without significant CAD.  Concern for anoxic brain injury 1/21: MRI shows areas of hypoxic-ischemic injury bilaterally. Fevered; cultures obtained. 1/23 severe brain damage 1/25: PEG . Neuro exam unchanged.  Does exhibit new eyelid tremor like movements and lip/jaw tremor like vs clonus movement.  Bolused Keppra and increased maintenance dose.  Repeat EEG. CTH: concerning for progression of anoxic injury.  1/26: Repeat EEG unchanged and continues to show myoclonic seizures despite addition of Valproic acid yesterday.  03/30/2021 Transfer to [name] from [place] for continuous EEG 1/28: Continues to have myoclonic sz on EEG 2/3 no meaningful neuro recovery. Diuresed 2/6 New fever to 102, WBC 16K - treated with vancomycin for 7d based on cultures.  2/11: percutaneous tracheostomy 2/13 >2/20 on trach collar. No vent.  2/20 Trach change to 6 cuffless 2/28: fever 3/13: eliquis started 4/3: fever 06/06/2021 CT of the chest with new PE and haziness consistent with pneumonia 4/5-4/9: responding well to PRN lasix 4/11 fever again after completion of abx- work up in progress 4/13 continues to have low-grade temps cultures from 4/11 no growth to date-suspect silent/occult aspiration-heparin changed back to Eliquis 4/13 4/14: Work-up negative with no growth to date  but red menance Tmax elevated 99.4 and respiratory rate up-x-ray did not show         any pneumonia but probably silently aspirating 08/04/21- ongoing.  Patient remains unresponsive.  Awaiting for placement and disposition has been difficult   Assessment and Plan:  S/p Cardiac arrest Postarrest anoxic encephalopathy Postarrest respiratory failure s/p tracheostomy S/P PEG tube placement 2D echocardiogram showed reduced LV function at <20% with global hypokinesis.  Now status post trach and PEG.  Remains obtunded with anoxic encephalopathy. 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 Continue supportive care. Patient currently keeps his eyes open, but does not answer questions or follow commands. Unchanged neurologic status   Acute respiratory failure ([name]) Status post trach collar.  Continue supportive care   RLL pneumonia, resolved Previously had completed vancomycin for MSSA and [name] in early march.  Received more than 7-day course of IV Fortaz. On 4/3, CT chest was ordered that indicated an area of alveolar infiltration that may represent pneumonia.  Tracheal aspirate has been sent for culture and is significant for normal respiratory flora. Concerning for aspiration. Completed course of Zosyn.   DVT (deep venous thrombosis) ([name]) Duplex ultrasound of lower extremity on 05/15/2021 showed age indeterminate deep vein thrombosis involving the left femoral vein, left proximal profunda vein, left posterior tibial veins, left popliteal vein, and left peroneal veins. -Continue Eliquis   Dysphagia Continue PEG tube feeding.  Tolerating but high risk of aspiration -Aspiration precautions   Pulmonary embolus ([name]) Noted to have possible acute on chronic pulmonary embolism on CT.  Also have some evidence of possible pulmonary hypertension.  Patient was previously managed on Eliquis which is transitioned  to Lovenox -Continue  Eliquis   Pressure injury of skin Lateral penis, posterior right heel, right foot. Not present on admission.   HTN (hypertension) Blood pressure elevated today. -Continue Coreg, Cardura, hydralazine and isosorbide dinitrate -PRN hydralazine   HFrEF (heart failure with reduced ejection fraction) ([name]) 2D echocardiogram-LVEF < 20%,  -Continue coreg, imdur, spironolactone   Myoclonus Seen by neurology. Per neuro notes, suppressing his myoclonus will not ultimately change the prognosis. -Continue Keppra, Klonopin   Hypernatremia-resolved Continue free water flushes  DVT prophylaxis: Lovenox Code Status:   Code Status: Full Code Family Communication: None at bedside Disposition Plan: Discharge home pending insurance, home health/services availability    Consultants:  Infectious disease Palliative care Neurology Cardiology Critical care  Procedures:  Tracheostomy PEG tube  Antimicrobials: Unasyn Cefepime Ceftazidime Vancomycin Cefazolin   Subjective: Non verbal. Afebrile. No new events overnight.  Objective: BP 125/88   Pulse 100   Temp 98.3 F (36.8 C) (Oral)   Resp 17   Ht 5\' 10"  (1.778 m)   Wt 93 kg   SpO2 97%   BMI 29.42 kg/m   Examination:  General: No distress Respiratory: Unlabored work of breathing.   Data Reviewed: I have personally reviewed following labs and imaging studies  CBC Lab Results  Component Value Date   WBC 8.3 08/06/2021   RBC 4.29 08/06/2021   HGB 13.1 08/06/2021   HCT 39.4 08/06/2021   MCV 91.8 08/06/2021   MCH 30.5 08/06/2021   PLT 173 08/06/2021   MCHC 33.2 08/06/2021   RDW 14.0 08/06/2021   LYMPHSABS 1.7 06/22/2021   MONOABS 1.3 (H) 06/22/2021   EOSABS 0.2 06/22/2021   BASOSABS 0.0 Q000111Q     Last metabolic panel Lab Results  Component Value Date   NA 135 08/06/2021   K 4.8 08/06/2021   CL 99 08/06/2021   CO2 28 08/06/2021   BUN 37 (H) 08/06/2021   CREATININE 0.72 08/06/2021   GLUCOSE 105 (H)  08/06/2021   GFRNONAA >60 08/06/2021   CALCIUM 9.0 08/06/2021   PHOS 3.4 08/02/2021   PROT 6.2 (L) 07/15/2021   ALBUMIN 2.3 (L) 07/15/2021   BILITOT 0.7 07/15/2021   ALKPHOS 54 07/15/2021   AST 18 07/15/2021   ALT 12 07/15/2021   ANIONGAP 8 08/06/2021    GFR: Estimated Creatinine Clearance: 126.6 mL/min (by C-G formula based on SCr of 0.72 mg/dL).  No results found for this or any previous visit (from the past 240 hour(s)).     Radiology Studies: No results found.    LOS: 134 days    Cordelia Poche, MD [facility] 08/11/2021, 1:56 PM   If 7PM-7AM, please contact night-coverage [email or web address]

## 2021-08-11 NOTE — TOC Progression Note (Addendum)
Transition of Care [facility]) - Progression Note    Patient Details  Name: Billy Cherry MRN: [id number] Date of Birth: Sep 15, 1970  Transition of Care [facility]) CM/SW Contact  Janae Bridgeman, RN Phone Number: 08/11/2021, 8:20 AM  Clinical Narrative:    CM called and spoke with Grandville Silos, [facility] Supervisor and he states that he had a lengthy discussion with the patient's wife yesterday regarding SNF placement at [facility] facility in [place] and the wife refused placement at the facility and requests plans to continue to find private duty staffing for the home through [employer].  I called and left a message with [facility] provider but was unable to reach the case manager, Herbert Seta, on the phone.  08/11/21 0854 - CM spoke with Jiles Crocker, RNCM Supervisor and she spoke with [facility] regarding denial for payment for Inpatient stay and the Insurance provider will mail the patient's wife documentation regarding this matter so that she is informed of financial responsibility.    Shon [facility] Supervisor requests that DTP Team continue to communicate with [employer] to continue search for available private duty staffing at this time.  I called and spoke with Jess Barters, Admissions director with [facility] on the phone and he states that he is actively recruiting for private duty for the home and he currently has no staff available to fill the hours at the home but will reach out to [facility] staff once the hours are filled.  Claudette Laws, MSW is aware and [facility] leadership will continue to support discharge plans to home at a later date - no discharge date has been determined at this time.  CM and MSW with DTP Team will continue to follow the patient for discharge to home with private duty through [employer].   Expected Discharge Plan: Home w Home Health Services Barriers to Discharge: Other (must enter comment) (Patient's wife - requested private duty services  through alternative provider for 12 hours shifts for staffing at the home.)  Expected Discharge Plan and Services Expected Discharge Plan: Home w Home Health Services In-house Referral: Clinical Social Work Discharge Planning Services: CM Consult, Follow-up appt scheduled Post Acute Care Choice: Home Health Living arrangements for the past 2 months: Single Family Home Expected Discharge Date: 08/07/21               DME Arranged: Janina Mayo supplies, Tube feeding, Suction, Hospital bed, Oxygen DME Agency: [employer]       HH Arranged: RN (Private Duty Nursing through [employer] versus [place]) HH Agency: [employer] Services Date HH Agency Contacted: 07/04/21 Time HH Agency Contacted: 1000 Representative spoke with at [facility] Agency: Jeanmarie Plant, Director with [employer] [phone]   Social Determinants of Health (SDOH) Interventions    Readmission Risk Interventions    04/14/2021   11:14 AM  Readmission Risk Prevention Plan  Transportation Screening Complete  HRI or Home Care Consult Complete  Social Work Consult for Recovery Care Planning/Counseling Complete  Palliative Care Screening Complete  Medication Review [employer]) Complete

## 2021-08-12 NOTE — Progress Notes (Signed)
PROGRESS NOTE    Billy Cherry  [id number] DOB: 03/05/1971 DOA: 03/30/2021 PCP: Patient, No Pcp Per (Inactive)   Brief Narrative: Patient is a 51 years old male with past medical history of hypertension who was brought into the hospital after being found unresponsive by his wife.  Patient was initially admitted to [facility] hospital for out of hospital cardiac arrest and ventricular fibrillation with respiratory arrest suspected from angioedema from ACE inhibitor leading to asphyxiation.  Patient was then subsequently transferred to [facility].  Below is the Sequence of events during hospitalization,  1/19 left heart cath without significant CAD.  Concern for anoxic brain injury 1/21: MRI shows areas of hypoxic-ischemic injury bilaterally. Fevered; cultures obtained. 1/23 severe brain damage 1/25: PEG . Neuro exam unchanged.  Does exhibit new eyelid tremor like movements and lip/jaw tremor like vs clonus movement.  Bolused Keppra and increased maintenance dose.  Repeat EEG. CTH: concerning for progression of anoxic injury.  1/26: Repeat EEG unchanged and continues to show myoclonic seizures despite addition of Valproic acid yesterday.  03/30/2021 Transfer to [name] from [place] for continuous EEG 1/28: Continues to have myoclonic sz on EEG 2/3 no meaningful neuro recovery. Diuresed 2/6 New fever to 102, WBC 16K - treated with vancomycin for 7d based on cultures.  2/11: percutaneous tracheostomy 2/13 >2/20 on trach collar. No vent.  2/20 Trach change to 6 cuffless 2/28: fever 3/13: eliquis started 4/3: fever 06/06/2021 CT of the chest with new PE and haziness consistent with pneumonia 4/5-4/9: responding well to PRN lasix 4/11 fever again after completion of abx- work up in progress 4/13 continues to have low-grade temps cultures from 4/11 no growth to date-suspect silent/occult aspiration-heparin changed back to Eliquis 4/13 4/14: Work-up negative with no growth to date  but red menance Tmax elevated 99.4 and respiratory rate up-x-ray did not show         any pneumonia but probably silently aspirating 08/04/21- ongoing.  Patient remains unresponsive.  Awaiting for placement and disposition has been difficult   Assessment and Plan:  S/p Cardiac arrest Postarrest anoxic encephalopathy Postarrest respiratory failure s/p tracheostomy S/P PEG tube placement 2D echocardiogram showed reduced LV function at <20% with global hypokinesis.  Now status post trach and PEG.  Remains obtunded with anoxic encephalopathy. -Continue Keppra  Anoxic encephalopathy ([place]) MRI brain significant for hypoxic brain injury. Continue supportive care. Patient currently keeps his eyes open, but does not answer questions or follow commands. Unchanged neurologic status   Acute respiratory failure ([name]) Status post trach collar. Continue supportive care   RLL pneumonia, resolved Previously had completed vancomycin for MSSA and [name] in early march.  Received more than 7-day course of IV Fortaz. On 4/3, CT chest was ordered that indicated an area of alveolar infiltration that may represent pneumonia.  Tracheal aspirate has been sent for culture and is significant for normal respiratory flora. Concerning for aspiration. Completed course of Zosyn.   DVT (deep venous thrombosis) ([name]) Duplex ultrasound of lower extremity on 05/15/2021 showed age indeterminate deep vein thrombosis involving the left femoral vein, left proximal profunda vein, left posterior tibial veins, left popliteal vein, and left peroneal veins. -Continue Eliquis   Dysphagia Continue PEG tube feeding.  Tolerating but high risk of aspiration -Aspiration precautions   Pulmonary embolus ([name]) Noted to have possible acute on chronic pulmonary embolism on CT.  Also have some evidence of possible pulmonary hypertension.  Patient was previously managed on Eliquis which is transitioned to  Lovenox -Continue  Eliquis   Pressure injury of skin Lateral penis, posterior right heel, right foot. Not present on admission.   HTN (hypertension) Blood pressure elevated today. -Continue Coreg, Cardura, hydralazine and isosorbide dinitrate -PRN hydralazine   HFrEF (heart failure with reduced ejection fraction) ([name]) 2D echocardiogram-LVEF < 20%,  -Continue coreg, imdur, spironolactone   Myoclonus Seen by neurology. Per neuro notes, suppressing his myoclonus will not ultimately change the prognosis. -Continue Keppra, Klonopin   Hypernatremia-resolved Continue free water flushes  DVT prophylaxis: Lovenox Code Status:   Code Status: Full Code Family Communication: None at bedside Disposition Plan: Discharge home pending insurance, home health/services availability    Consultants:  Infectious disease Palliative care Neurology Cardiology Critical care  Procedures:  Tracheostomy PEG tube  Antimicrobials: Unasyn Cefepime Ceftazidime Vancomycin Cefazolin   Subjective: Non verbal. Afebrile. No new events overnight. Unchanged.  Objective: BP 125/84   Pulse 91   Temp 98.5 F (36.9 C) (Oral)   Resp 20   Ht 5\' 10"  (1.778 m)   Wt 97 kg   SpO2 98%   BMI 30.68 kg/m   Examination:  General exam: Appears calm and comfortable Respiratory system: Rhonchi    Data Reviewed: I have personally reviewed following labs and imaging studies  CBC Lab Results  Component Value Date   WBC 8.3 08/06/2021   RBC 4.29 08/06/2021   HGB 13.1 08/06/2021   HCT 39.4 08/06/2021   MCV 91.8 08/06/2021   MCH 30.5 08/06/2021   PLT 173 08/06/2021   MCHC 33.2 08/06/2021   RDW 14.0 08/06/2021   LYMPHSABS 1.7 06/22/2021   MONOABS 1.3 (H) 06/22/2021   EOSABS 0.2 06/22/2021   BASOSABS 0.0 Q000111Q     Last metabolic panel Lab Results  Component Value Date   NA 135 08/06/2021   K 4.8 08/06/2021   CL 99 08/06/2021   CO2 28 08/06/2021   BUN 37 (H) 08/06/2021   CREATININE 0.72 08/06/2021    GLUCOSE 105 (H) 08/06/2021   GFRNONAA >60 08/06/2021   CALCIUM 9.0 08/06/2021   PHOS 3.4 08/02/2021   PROT 6.2 (L) 07/15/2021   ALBUMIN 2.3 (L) 07/15/2021   BILITOT 0.7 07/15/2021   ALKPHOS 54 07/15/2021   AST 18 07/15/2021   ALT 12 07/15/2021   ANIONGAP 8 08/06/2021    GFR: Estimated Creatinine Clearance: 129.1 mL/min (by C-G formula based on SCr of 0.72 mg/dL).  No results found for this or any previous visit (from the past 240 hour(s)).     Radiology Studies: No results found.    LOS: 135 days    Cordelia Poche, MD [facility] 08/12/2021, 4:34 PM   If 7PM-7AM, please contact night-coverage [email or web address]

## 2021-08-13 ENCOUNTER — Inpatient Hospital Stay (HOSPITAL_COMMUNITY): Payer: BC Managed Care – PPO

## 2021-08-13 LAB — CBC
HCT: 41.5 % (ref 39.0–52.0)
Hemoglobin: 13.8 g/dL (ref 13.0–17.0)
MCH: 30.5 pg (ref 26.0–34.0)
MCHC: 33.3 g/dL (ref 30.0–36.0)
MCV: 91.6 fL (ref 80.0–100.0)
Platelets: 272 10*3/uL (ref 150–400)
RBC: 4.53 MIL/uL (ref 4.22–5.81)
RDW: 14.6 % (ref 11.5–15.5)
WBC: 9.8 10*3/uL (ref 4.0–10.5)
nRBC: 0 % (ref 0.0–0.2)

## 2021-08-13 LAB — COMPREHENSIVE METABOLIC PANEL
ALT: 15 U/L (ref 0–44)
AST: 18 U/L (ref 15–41)
Albumin: 2.6 g/dL — ABNORMAL LOW (ref 3.5–5.0)
Alkaline Phosphatase: 61 U/L (ref 38–126)
Anion gap: 10 (ref 5–15)
BUN: 40 mg/dL — ABNORMAL HIGH (ref 6–20)
CO2: 27 mmol/L (ref 22–32)
Calcium: 9.6 mg/dL (ref 8.9–10.3)
Chloride: 96 mmol/L — ABNORMAL LOW (ref 98–111)
Creatinine, Ser: 0.63 mg/dL (ref 0.61–1.24)
GFR, Estimated: >60 mL/min (ref >60)
Glucose, Bld: 127 mg/dL — ABNORMAL HIGH (ref 70–99)
Potassium: 4.7 mmol/L (ref 3.5–5.1)
Sodium: 133 mmol/L — ABNORMAL LOW (ref 135–145)
Total Bilirubin: 0.3 mg/dL (ref 0.3–1.2)
Total Protein: 7.1 g/dL (ref 6.5–8.1)

## 2021-08-13 MED ORDER — SODIUM CHLORIDE 0.9 % IV SOLN
2.0000 g | Freq: Three times a day (TID) | INTRAVENOUS | Status: AC
  Administered 2021-08-13 – 2021-08-19 (×19): 2 g via INTRAVENOUS
  Filled 2021-08-13 (×19): qty 12.5

## 2021-08-13 NOTE — Progress Notes (Signed)
PROGRESS NOTE    Billy Cherry  [id number] DOB: Dec 07, 1970 DOA: 03/30/2021 PCP: Patient, No Pcp Per (Inactive)   Brief Narrative: Patient is a 51 years old male with past medical history of hypertension who was brought into the hospital after being found unresponsive by his wife.  Patient was initially admitted to [facility] for out of hospital cardiac arrest and ventricular fibrillation with respiratory arrest suspected from angioedema from ACE inhibitor leading to asphyxiation.  Patient was then subsequently transferred to [facility].  Below is the Sequence of events during hospitalization,  1/19 left heart cath without significant CAD.  Concern for anoxic brain injury 1/21: MRI shows areas of hypoxic-ischemic injury bilaterally. Fevered; cultures obtained. 1/23 severe brain damage 1/25: PEG . Neuro exam unchanged.  Does exhibit new eyelid tremor like movements and lip/jaw tremor like vs clonus movement.  Bolused Keppra and increased maintenance dose.  Repeat EEG. CTH: concerning for progression of anoxic injury.  1/26: Repeat EEG unchanged and continues to show myoclonic seizures despite addition of Valproic acid yesterday.  03/30/2021 Transfer to [name] from [place] for continuous EEG 1/28: Continues to have myoclonic sz on EEG 2/3 no meaningful neuro recovery. Diuresed 2/6 New fever to 102, WBC 16K - treated with vancomycin for 7d based on cultures.  2/11: percutaneous tracheostomy 2/13 >2/20 on trach collar. No vent.  2/20 Trach change to 6 cuffless 2/28: fever 3/13: eliquis started 4/3: fever 06/06/2021 CT of the chest with new PE and haziness consistent with pneumonia 4/5-4/9: responding well to PRN lasix 4/11 fever again after completion of abx- work up in progress 4/13 continues to have low-grade temps cultures from 4/11 no growth to date-suspect silent/occult aspiration-heparin changed back to Eliquis 4/13 4/14: Work-up negative with no growth to date  but red menance Tmax elevated 99.4 and respiratory rate up-x-ray did not show         any pneumonia but probably silently aspirating 08/04/21- ongoing.  Patient remains unresponsive.  Awaiting for placement and disposition has been difficult   Assessment and Plan:  S/p Cardiac arrest Postarrest anoxic encephalopathy Postarrest respiratory failure s/p tracheostomy S/P PEG tube placement 2D echocardiogram showed reduced LV function at <20% with global hypokinesis.  Now status post trach and PEG.  Remains obtunded with anoxic encephalopathy. -Continue Keppra  Anoxic encephalopathy ([place]) MRI brain significant for hypoxic brain injury. Continue supportive care. Patient currently keeps his eyes open, but does not answer questions or follow commands. Unchanged neurologic status   Acute respiratory failure ([name]) Status post trach collar. Continue supportive care   RLL pneumonia, resolved Previously had completed vancomycin for MSSA and [name] in early march.  Received more than 7-day course of IV Fortaz. On 4/3, CT chest was ordered that indicated an area of alveolar infiltration that may represent pneumonia.  Tracheal aspirate has been sent for culture and is significant for normal respiratory flora. Concerning for aspiration. Completed course of Zosyn.   DVT (deep venous thrombosis) ([name]) Duplex ultrasound of lower extremity on 05/15/2021 showed age indeterminate deep vein thrombosis involving the left femoral vein, left proximal profunda vein, left posterior tibial veins, left popliteal vein, and left peroneal veins. -Continue Eliquis   Dysphagia Continue PEG tube feeding.  Tolerating but high risk of aspiration -Aspiration precautions   Pulmonary embolus ([name]) Noted to have possible acute on chronic pulmonary embolism on CT.  Also have some evidence of possible pulmonary hypertension.  Patient was previously managed on Eliquis which is transitioned to  Lovenox -Continue  Eliquis   Pressure injury of skin Lateral penis, posterior right heel, right foot. Not present on admission.   HTN (hypertension) Blood pressure elevated today. -Continue Coreg, Cardura, hydralazine and isosorbide dinitrate -PRN hydralazine   HFrEF (heart failure with reduced ejection fraction) ([name]) 2D echocardiogram-LVEF < 20%,  -Continue coreg, imdur, spironolactone   Myoclonus Seen by neurology. Per neuro notes, suppressing his myoclonus will not ultimately change the prognosis. -Continue Keppra, Klonopin   Hypernatremia-resolved Continue free water flushes  DVT prophylaxis: Lovenox Code Status:   Code Status: Full Code Family Communication: None at bedside Disposition Plan: Discharge home pending insurance, home health/services availability    Consultants:  Infectious disease Palliative care Neurology Cardiology Critical care  Procedures:  Tracheostomy PEG tube  Antimicrobials: Unasyn Cefepime Ceftazidime Vancomycin Cefazolin   Subjective: Nonverbal.  Objective: BP (!) 118/93 (BP Location: Right Arm)   Pulse 97   Temp 98.3 F (36.8 C) (Oral)   Resp 20   Ht 5\' 10"  (1.778 m)   Wt 96 kg   SpO2 99%   BMI 30.37 kg/m   Examination:  General exam: Appears calm and comfortable Respiratory system: Respiratory effort normal.   Data Reviewed: I have personally reviewed following labs and imaging studies  CBC Lab Results  Component Value Date   WBC 8.3 08/06/2021   RBC 4.29 08/06/2021   HGB 13.1 08/06/2021   HCT 39.4 08/06/2021   MCV 91.8 08/06/2021   MCH 30.5 08/06/2021   PLT 173 08/06/2021   MCHC 33.2 08/06/2021   RDW 14.0 08/06/2021   LYMPHSABS 1.7 06/22/2021   MONOABS 1.3 (H) 06/22/2021   EOSABS 0.2 06/22/2021   BASOSABS 0.0 Q000111Q     Last metabolic panel Lab Results  Component Value Date   NA 135 08/06/2021   K 4.8 08/06/2021   CL 99 08/06/2021   CO2 28 08/06/2021   BUN 37 (H) 08/06/2021   CREATININE 0.72 08/06/2021    GLUCOSE 105 (H) 08/06/2021   GFRNONAA >60 08/06/2021   CALCIUM 9.0 08/06/2021   PHOS 3.4 08/02/2021   PROT 6.2 (L) 07/15/2021   ALBUMIN 2.3 (L) 07/15/2021   BILITOT 0.7 07/15/2021   ALKPHOS 54 07/15/2021   AST 18 07/15/2021   ALT 12 07/15/2021   ANIONGAP 8 08/06/2021    GFR: Estimated Creatinine Clearance: 128.4 mL/min (by C-G formula based on SCr of 0.72 mg/dL).  No results found for this or any previous visit (from the past 240 hour(s)).     Radiology Studies: No results found.    LOS: 136 days    Cordelia Poche, MD [facility] 08/13/2021, 10:39 AM   If 7PM-7AM, please contact night-coverage [email or web address]

## 2021-08-13 NOTE — Progress Notes (Addendum)
Pharmacy Antibiotic Note  Billy Cherry is a 51 y.o. male transferred to [facility] on 03/30/2021 with seizure requiring EEG monitoring.  Throughout the extended LOS, patient has completed several courses of antibiotics for PNA.  Pharmacy has been consulted for cefepime dosing for possible multifocal PNA on CXR.  He has a trach.  Renal function stable, afebrile, WBC WNL.  Plan: Cefepime 2gm IV Q8H Monitor renal fxn, micro data  Height:  (bed scale not working) Weight: 96 kg (211 lb 10.3 oz) IBW/kg (Calculated) : 73  Temp (24hrs), Avg:98.6 F (37 C), Min:98.3 F (36.8 C), Max:98.8 F (37.1 C)  Recent Labs  Lab 08/13/21 1637  WBC 9.8  CREATININE 0.63    Estimated Creatinine Clearance: 128.4 mL/min (by C-G formula based on SCr of 0.63 mg/dL).    Allergies  Allergen Reactions   Lisinopril Swelling    angioedema   Cefepime 4/4>>4/5, 6/11 >> Unasyn 4/5 >> 4/10   3/6: MRSE bacteremia (contaminant) 4/15 BCx x1: negative 4/11 BCx: negative 4/11 UA negative 4/4 bld: negative 6/11 TA -   Modine Oppenheimer D. Laney Potash, PharmD, BCPS, BCCCP 08/13/2021, 6:54 PM

## 2021-08-14 DIAGNOSIS — J189 Pneumonia, unspecified organism: Secondary | ICD-10-CM

## 2021-08-14 MED ORDER — HYDRALAZINE HCL 50 MG PO TABS
100.0000 mg | ORAL_TABLET | Freq: Three times a day (TID) | ORAL | Status: DC
  Administered 2021-08-14 – 2021-10-04 (×151): 100 mg
  Filled 2021-08-14 (×154): qty 2

## 2021-08-14 NOTE — Progress Notes (Addendum)
Telephone call to spouse to arrange a family meeting to discuss disposition needs. Meeting scheduled for Thursday August 17, 2021 at [facility] Transition of Care  [phone]

## 2021-08-14 NOTE — Progress Notes (Signed)
PROGRESS NOTE    Billy Cherry  [id number] DOB: [birth year] DOA: 03/30/2021 PCP: Patient, No Pcp Per (Inactive)   Brief Narrative: Patient is a 51 years old male with past medical history of hypertension who was brought into the hospital after being found unresponsive by his wife.  Patient was initially admitted to [facility] hospital for out of hospital cardiac arrest and ventricular fibrillation with respiratory arrest suspected from angioedema from ACE inhibitor leading to asphyxiation.  Patient was then subsequently [facility].  Below is the Sequence of events during hospitalization,  1/19 left heart cath without significant CAD.  Concern for anoxic brain injury 1/21: MRI shows areas of hypoxic-ischemic injury bilaterally. Fevered; cultures obtained. 1/23 severe brain damage 1/25: PEG . Neuro exam unchanged.  Does exhibit new eyelid tremor like movements and lip/jaw tremor like vs clonus movement.  Bolused Keppra and increased maintenance dose.  Repeat EEG. CTH: concerning for progression of anoxic injury.  1/26: Repeat EEG unchanged and continues to show myoclonic seizures despite addition of Valproic acid yesterday.  03/30/2021 Transfer to [name] from [place] for continuous EEG 1/28: Continues to have myoclonic sz on EEG 2/3 no meaningful neuro recovery. Diuresed 2/6 New fever to 102, WBC 16K - treated with vancomycin for 7d based on cultures.  2/11: percutaneous tracheostomy 2/13 >2/20 on trach collar. No vent.  2/20 Trach change to 6 cuffless 2/28: fever 3/13: eliquis started 4/3: fever 06/06/2021 CT of the chest with new PE and haziness consistent with pneumonia 4/5-4/9: responding well to PRN lasix 4/11 fever again after completion of abx- work up in progress 4/13 continues to have low-grade temps cultures from 4/11 no growth to date-suspect silent/occult aspiration-heparin changed back to Eliquis 4/13 4/14: Work-up negative with no growth to date  but red menance Tmax elevated 99.4 and respiratory rate up-x-ray did not show         any pneumonia but probably silently aspirating 08/04/21- ongoing.  Patient remains unresponsive.  Awaiting for placement and disposition has been difficult   Assessment and Plan:  S/p Cardiac arrest Postarrest anoxic encephalopathy Postarrest respiratory failure s/p tracheostomy S/P PEG tube placement 2D echocardiogram showed reduced LV function at <20% with global hypokinesis.  Now status post trach and PEG.  Remains obtunded with anoxic encephalopathy. -Continue Keppra  Anoxic encephalopathy ([place]) MRI brain significant for hypoxic brain injury. Continue supportive care. Patient currently keeps his eyes open, but does not answer questions or follow commands. Unchanged neurologic status   Acute respiratory failure ([name]) Status post trach collar. Continue supportive care   RLL pneumonia, resolved Previously had completed vancomycin for MSSA and [name] in early march.  Received more than 7-day course of IV Fortaz. On 4/3, CT chest was ordered that indicated an area of alveolar infiltration that may represent pneumonia.  Tracheal aspirate has been sent for culture and is significant for normal respiratory flora. Concerning for aspiration. Completed course of Zosyn. Patient with worsening secretions. Chest x-ray on 6/11 concerning for possible pneumonia -Cefepime 6/11>>   DVT (deep venous thrombosis) ([name]) Duplex ultrasound of lower extremity on 05/15/2021 showed age indeterminate deep vein thrombosis involving the left femoral vein, left proximal profunda vein, left posterior tibial veins, left popliteal vein, and left peroneal veins. -Continue Eliquis   Dysphagia Continue PEG tube feeding.  Tolerating but high risk of aspiration -Aspiration precautions   Pulmonary embolus ([name]) Noted to have possible acute on chronic pulmonary embolism on CT.  Also have some evidence of  possible  pulmonary hypertension.  Patient was previously managed on Eliquis which is transitioned to Lovenox -Continue Eliquis   Pressure injury of skin Lateral penis, posterior right heel, right foot. Not present on admission.   HTN (hypertension) Blood pressure continues to be elevated. -Continue Coreg, Cardura and isosorbide dinitrate -Increase to hydralazine 100 mg TID -PRN hydralazine   HFrEF (heart failure with reduced ejection fraction) ([name]) 2D echocardiogram-LVEF < 20%,  -Continue coreg, imdur, spironolactone   Myoclonus Seen by neurology. Per neuro notes, suppressing his myoclonus will not ultimately change the prognosis. -Continue Keppra, Klonopin   Hypernatremia-resolved Continue free water flushes  DVT prophylaxis: Lovenox Code Status:   Code Status: Full Code Family Communication: None at bedside Disposition Plan: Discharge home pending insurance, home health/services availability vs SNF   Consultants:  Infectious disease Palliative care Neurology Cardiology Critical care  Procedures:  Tracheostomy PEG tube  Antimicrobials: Unasyn Cefepime Ceftazidime Vancomycin Cefazolin   Subjective: Nonverbal  Objective: BP (!) 154/109 (BP Location: Right Arm)   Pulse 100   Temp 98.9 F (37.2 C) (Oral)   Resp 20   Ht 5\' 10"  (1.778 m)   Wt 96 kg   SpO2 98%   BMI 30.37 kg/m   Examination:  General exam: Appears calm and comfortable Respiratory system: Right sided rhonchi. Respiratory effort normal. Cardiovascular system: S1 & S2 heard, RRR. No murmurs. Gastrointestinal system: Abdomen is nondistended, soft and nontender. Normal bowel sounds heard. Central nervous system: Not responsive. Some purposeless spontaneous movements. Musculoskeletal: No edema. No calf tenderness Skin: No cyanosis. No rashes   Data Reviewed: I have personally reviewed following labs and imaging studies  CBC Lab Results  Component Value Date   WBC 9.8 08/13/2021   RBC 4.53  08/13/2021   HGB 13.8 08/13/2021   HCT 41.5 08/13/2021   MCV 91.6 08/13/2021   MCH 30.5 08/13/2021   PLT 272 08/13/2021   MCHC 33.3 08/13/2021   RDW 14.6 08/13/2021   LYMPHSABS 1.7 06/22/2021   MONOABS 1.3 (H) 06/22/2021   EOSABS 0.2 06/22/2021   BASOSABS 0.0 Q000111Q     Last metabolic panel Lab Results  Component Value Date   NA 133 (L) 08/13/2021   K 4.7 08/13/2021   CL 96 (L) 08/13/2021   CO2 27 08/13/2021   BUN 40 (H) 08/13/2021   CREATININE 0.63 08/13/2021   GLUCOSE 127 (H) 08/13/2021   GFRNONAA >60 08/13/2021   CALCIUM 9.6 08/13/2021   PHOS 3.4 08/02/2021   PROT 7.1 08/13/2021   ALBUMIN 2.6 (L) 08/13/2021   BILITOT 0.3 08/13/2021   ALKPHOS 61 08/13/2021   AST 18 08/13/2021   ALT 15 08/13/2021   ANIONGAP 10 08/13/2021    GFR: Estimated Creatinine Clearance: 128.4 mL/min (by C-G formula based on SCr of 0.63 mg/dL).  No results found for this or any previous visit (from the past 240 hour(s)).     Radiology Studies: DG CHEST PORT 1 VIEW  Result Date: 08/13/2021 CLINICAL DATA:  Respiratory failure, cardiac arrest EXAM: PORTABLE CHEST 1 VIEW COMPARISON:  06/17/2021 FINDINGS: Tip of tracheostomy is 3.4 cm above the carina. Transverse diameter of heart is increased. There is poor inspiration. Increased markings are seen in the left parahilar region and both lower lung fields. There is no focal consolidation. There are no signs of alveolar pulmonary edema. There is no significant pleural effusion or pneumothorax. IMPRESSION: Cardiomegaly. Increased interstitial markings are seen in the left parahilar region and both lower lung fields suggesting possible multifocal pneumonia. Evaluation of  lung fields is limited by poor inspiration. There is no pleural effusion. Electronically Signed   By: Elmer Picker M.D.   On: 08/13/2021 18:26      LOS: 137 days    Cordelia Poche, MD [facility] 08/14/2021, 12:22 PM   If 7PM-7AM, please contact  night-coverage [email or web address]

## 2021-08-14 NOTE — Plan of Care (Signed)

## 2021-08-14 NOTE — TOC Progression Note (Addendum)
Transition of Care [employer]) - Progression Note    Patient Details  Name: Billy Cherry MRN: [id number] Date of Birth: 05-29-1970  Transition of Care [facility]) CM/SW Contact  Janae Bridgeman, RN Phone Number: 08/14/2021, 11:17 AM  Clinical Narrative:    CM spoke with Jiles Crocker, RNCM [facility] Supervisor and she states that she spoke with the patient's wife, Theola Sequin, this morning and arranged a family care meeting scheduled for Thursday, 08/17/2021 at 11 am.  Ave Filter, [facility] Supervisor states that she spoke with Valinda Hoar [facility] provider last week and Inpatient denial letter will be mailed to the patient's address to the attention of the patient's wife - [name] and she is aware.  CM and MSW with DTP Team will continue to follow the patient for discharge to home with private duty nursing care through [facility] once staffing has been obtained.  Patient's wife has recently refused SNF placement last week to [facility] facility in [place], [place].   Expected Discharge Plan: Home w Home Health Services Barriers to Discharge: Other (must enter comment) (Patient's wife - requested private duty services through alternative provider for 12 hours shifts for staffing at the home.)  Expected Discharge Plan and Services Expected Discharge Plan: Home w Home Health Services In-house Referral: Clinical Social Work Discharge Planning Services: CM Consult, Follow-up appt scheduled Post Acute Care Choice: Home Health Living arrangements for the past 2 months: Single Family Home Expected Discharge Date: 08/07/21               DME Arranged: Janina Mayo supplies, Tube feeding, Suction, Hospital bed, Oxygen DME Agency: [employer]       HH Arranged: RN (Private Duty Nursing through [employer] versus [place]) HH Agency: [employer] Services Date HH Agency Contacted: 07/04/21 Time HH Agency Contacted: 1000 Representative spoke with at [facility] Agency: Jeanmarie Plant,  Director with [employer] [phone]   Social Determinants of Health (SDOH) Interventions    Readmission Risk Interventions    04/14/2021   11:14 AM  Readmission Risk Prevention Plan  Transportation Screening Complete  HRI or Home Care Consult Complete  Social Work Consult for Recovery Care Planning/Counseling Complete  Palliative Care Screening Complete  Medication Review [employer]) Complete

## 2021-08-14 NOTE — Progress Notes (Signed)
Trach changed per protocol. No complications noted. Color change on ETCO2. Good BBS throughout. Vitals stable.

## 2021-08-14 NOTE — Progress Notes (Signed)
NAME:  Billy Cherry, MRN:  [id number], DOB:  [birth year], LOS: 137 ADMISSION DATE:  03/30/2021, CONSULTATION DATE:  1/19 REFERRING MD:  Evette Georges, CHIEF COMPLAINT:  Found down, cardiac arrest   Brief Pt Description / Synopsis:  51 year old male with out-of-hospital V. fib cardiac arrest in setting of respiratory arrest due to suspected angioedema from ACE inhibitor leading to asphyxiation.  Concern on his 12 lead EKG for anterolateral ST elevation so he was taken to the cath lab where his study showed no significant coronary artery disease and an LVEF < 20%  Now with anoxic brain injury.  Pertinent  Medical History  Hypertension  Significant Hospital Events: Including procedures, antibiotic start and stop dates in addition to other pertinent events   1/19 admission, left heart cath without significant CAD.  Concern for anoxic brain injury 1/20: MRI pending, Neuro following 1/21: MRI shows areas of hypoxic-ischemic injury bilaterally. Fevered; cultures obtained. 1/23 severe brain damage 1/24 evidence of severe brain damage, remains critically ill 1/25: Plan for PEG today. Neuro exam unchanged.  Does exhibit new eyelid tremor like movements and lip/jaw tremor like vs clonus movement.  Bolused Keppra and increased maintenance dose.  Repeat EEG. CTH: concerning for progression of anoxic injury.  1/26: Repeat EEG unchanged and continues to show myoclonic seizures despite addition of Valproic acid yesterday. transfer to [facility] for continuous EEG 1/28: Continues to have myoclonic sz on EEG 1/29 No acute issues overnight 2/3 no meaningful neuro recovery. Diuresed 2/5 unchanged 2/6 New fever to 102, WBC 16K - treated with vancomycin for 7d based on cultures.  2/11: percutaneous tracheostomy 2/13 >2/20 on trach collar. No vent.  2/20 Trach change to 6 cuffless 2/27 status unchanged 3/13 unchanged 3/20 due for trach exchange given continue secretions (although improved) will stick to 6 cuffless  for exchange  06/05/2021 Continued thick  secretions, scant blood tinged 4/3/am 06/06/2021 CT of the chest with new PE and haziness consistent with pneumonia 5/30 continue tube feeding/free water  Interim History / Subjective:  Stable on ATC at 28%. Still having copious thick secretions. Has strong cough and able to cough up secretions per RN.  Objective   Blood pressure (!) 141/101, pulse 97, temperature 99 F (37.2 C), temperature source Oral, resp. rate 20, height 5\' 10"  (1.778 m), weight 96 kg, SpO2 100 %.    FiO2 (%):  [25 %-28 %] 28 %   Intake/Output Summary (Last 24 hours) at 08/14/2021 0806 Last data filed at 08/14/2021 0556 Gross per 24 hour  Intake 775.78 ml  Output 400 ml  Net 375.78 ml     Filed Weights   08/12/21 0500 08/13/21 0412 08/13/21 0542  Weight: 97 kg 96 kg 96 kg   Examination:  General: ill appearing man lying in bed in NAD HEENT: Trach intact, thick secretions noted (unchanged per RN) [place]/AT, eyes anicteric Resp: Coarse rhonchi bilaterally Cardiac: RRR Neuro: eyes open spontaneously, no response to stimulation during exam, strong cough Abd: PEG, mild bleeding from lower edge of stoma but no erythema or fluctuance  Assessment & Plan:  Out of hospital V-fib arrest Postarrest anoxic brain injury Myoclonus Respiratory failure with prolonged mechanical ventilation Tracheostomy dependent Cardiomyopathy AKI Pulmonary embolism Hypertension  -Trach care per protocol; not a candidate for decannulation unless mental status improves.  -Continue frequent suctioning -Continue atropine drops, Glycopyrrolate per tube, scopolamine patch -Add chest PT -Con't eliquis; anticipate he will need indefinitely -Overall prognosis remains poor unfortunately.    Rest per primary team. PCCM will continue  to follow weekly. Please call if needs arise sooner.   Rutherford Guys, PA - [facility] For pager details, please see AMION or use Epic  chat  After 1900, please call [facility] for cross coverage needs 08/14/2021, 8:11 AM

## 2021-08-15 ENCOUNTER — Inpatient Hospital Stay (HOSPITAL_COMMUNITY): Payer: BC Managed Care – PPO

## 2021-08-15 NOTE — Progress Notes (Signed)
PROGRESS NOTE    Billy Cherry  [id number] DOB: 03/10/1970 DOA: 03/30/2021 PCP: Patient, No Pcp Per (Inactive)   Brief Narrative: Patient is a 51 years old male with past medical history of hypertension who was brought into the hospital after being found unresponsive by his wife.  Patient was initially admitted to [facility] hospital for out of hospital cardiac arrest and ventricular fibrillation with respiratory arrest suspected from angioedema from ACE inhibitor leading to asphyxiation.  Patient was then subsequently transferred to [facility].  Below is the Sequence of events during hospitalization,  1/19 left heart cath without significant CAD.  Concern for anoxic brain injury 1/21: MRI shows areas of hypoxic-ischemic injury bilaterally. Fevered; cultures obtained. 1/23 severe brain damage 1/25: PEG . Neuro exam unchanged.  Does exhibit new eyelid tremor like movements and lip/jaw tremor like vs clonus movement.  Bolused Keppra and increased maintenance dose.  Repeat EEG. CTH: concerning for progression of anoxic injury.  1/26: Repeat EEG unchanged and continues to show myoclonic seizures despite addition of Valproic acid yesterday.  03/30/2021 Transfer to [name] from [place] for continuous EEG 1/28: Continues to have myoclonic sz on EEG 2/3 no meaningful neuro recovery. Diuresed 2/6 New fever to 102, WBC 16K - treated with vancomycin for 7d based on cultures.  2/11: percutaneous tracheostomy 2/13 >2/20 on trach collar. No vent.  2/20 Trach change to 6 cuffless 2/28: fever 3/13: eliquis started 4/3: fever 06/06/2021 CT of the chest with new PE and haziness consistent with pneumonia 4/5-4/9: responding well to PRN lasix 4/11 fever again after completion of abx- work up in progress 4/13 continues to have low-grade temps cultures from 4/11 no growth to date-suspect silent/occult aspiration-heparin changed back to Eliquis 4/13 4/14: Work-up negative with no growth to date  but red menance Tmax elevated 99.4 and respiratory rate up-x-ray did not show         any pneumonia but probably silently aspirating 08/04/21- ongoing.  Patient remains unresponsive.  Awaiting for placement and disposition has been difficult   Assessment and Plan:  S/p Cardiac arrest Postarrest anoxic encephalopathy Postarrest respiratory failure s/p tracheostomy S/P PEG tube placement 2D echocardiogram showed reduced LV function at <20% with global hypokinesis.  Now status post trach and PEG.  Remains obtunded with anoxic encephalopathy. -Continue Keppra  Anoxic encephalopathy ([place]) MRI brain significant for hypoxic brain injury. Continue supportive care. Patient currently keeps his eyes open, but does not answer questions or follow commands. Unchanged neurologic status   Acute respiratory failure with hypoxia Status post trach collar. Continue supportive care. Now chronic.   RLL pneumonia, resolved Previously had completed vancomycin for MSSA and [name] in early march.  Received more than 7-day course of IV Fortaz. On 4/3, CT chest was ordered that indicated an area of alveolar infiltration that may represent pneumonia.  Tracheal aspirate has been sent for culture and is significant for normal respiratory flora. Concerning for aspiration. Completed course of Zosyn. Patient with worsening secretions. Chest x-ray on 6/11 concerning for possible pneumonia -Cefepime 6/11>>   DVT (deep venous thrombosis) ([name]) Duplex ultrasound of lower extremity on 05/15/2021 showed age indeterminate deep vein thrombosis involving the left femoral vein, left proximal profunda vein, left posterior tibial veins, left popliteal vein, and left peroneal veins. -Continue Eliquis   Dysphagia On bolus feeds per dietitian. Initially tolerating but now with emesis -Aspiration precautions -Hold tube feeds today  Emesis Unsure of etiology -Hold tube feeds -Abdominal x-ray -IV fluids while  holding  tube feeds   Pulmonary embolus ([name]) Noted to have possible acute on chronic pulmonary embolism on CT.  Also have some evidence of possible pulmonary hypertension.  Patient was previously managed on Eliquis which is transitioned to Lovenox -Continue Eliquis   Pressure injury of skin Lateral penis, posterior right heel, right foot. Not present on admission.   HTN (hypertension) Blood pressure continues to be elevated. -Continue Coreg, Cardura and isosorbide dinitrate -Increase to hydralazine 100 mg TID -PRN hydralazine   HFrEF (heart failure with reduced ejection fraction) ([name]) 2D echocardiogram-LVEF < 20%,  -Continue coreg, imdur, spironolactone   Myoclonus Seen by neurology. Per neuro notes, suppressing his myoclonus will not ultimately change the prognosis. -Continue Keppra, Klonopin   Hypernatremia-resolved Continue free water flushes  DVT prophylaxis: Lovenox Code Status:   Code Status: Full Code Family Communication: None at bedside Disposition Plan: Discharge home pending insurance, home health/services availability vs SNF   Consultants:  Infectious disease Palliative care Neurology Cardiology Critical care  Procedures:  Tracheostomy PEG tube  Antimicrobials: Unasyn Cefepime Ceftazidime Vancomycin Cefazolin   Subjective: Nonverbal. Multiple episodes of emesis overnight per documentation  Objective: BP 110/75 (BP Location: Right Arm)   Pulse 96   Temp 99.2 F (37.3 C) (Axillary)   Resp 20   Ht 5\' 10"  (1.778 m)   Wt 96 kg   SpO2 99%   BMI 30.37 kg/m   Examination:  General exam: Appears calm and comfortable Respiratory system: Clear to auscultation. Respiratory effort normal. Cardiovascular system: S1 & S2 heard, RRR. No murmurs Gastrointestinal system: Abdomen is non-distended, soft and nontender. Slightly decreased-normal bowel sounds heard. Central nervous system: GCS 3-4 Musculoskeletal: No edema. No calf tenderness Skin: No  cyanosis. No rashes Psychiatry: Judgement and insight appear normal. Mood & affect appropriate.    Data Reviewed: I have personally reviewed following labs and imaging studies  CBC Lab Results  Component Value Date   WBC 9.8 08/13/2021   RBC 4.53 08/13/2021   HGB 13.8 08/13/2021   HCT 41.5 08/13/2021   MCV 91.6 08/13/2021   MCH 30.5 08/13/2021   PLT 272 08/13/2021   MCHC 33.3 08/13/2021   RDW 14.6 08/13/2021   LYMPHSABS 1.7 06/22/2021   MONOABS 1.3 (H) 06/22/2021   EOSABS 0.2 06/22/2021   BASOSABS 0.0 Q000111Q     Last metabolic panel Lab Results  Component Value Date   NA 133 (L) 08/13/2021   K 4.7 08/13/2021   CL 96 (L) 08/13/2021   CO2 27 08/13/2021   BUN 40 (H) 08/13/2021   CREATININE 0.63 08/13/2021   GLUCOSE 127 (H) 08/13/2021   GFRNONAA >60 08/13/2021   CALCIUM 9.6 08/13/2021   PHOS 3.4 08/02/2021   PROT 7.1 08/13/2021   ALBUMIN 2.6 (L) 08/13/2021   BILITOT 0.3 08/13/2021   ALKPHOS 61 08/13/2021   AST 18 08/13/2021   ALT 15 08/13/2021   ANIONGAP 10 08/13/2021    GFR: Estimated Creatinine Clearance: 128.4 mL/min (by C-G formula based on SCr of 0.63 mg/dL).   Radiology Studies: DG CHEST PORT 1 VIEW  Result Date: 08/13/2021 CLINICAL DATA:  Respiratory failure, cardiac arrest EXAM: PORTABLE CHEST 1 VIEW COMPARISON:  06/17/2021 FINDINGS: Tip of tracheostomy is 3.4 cm above the carina. Transverse diameter of heart is increased. There is poor inspiration. Increased markings are seen in the left parahilar region and both lower lung fields. There is no focal consolidation. There are no signs of alveolar pulmonary edema. There is no significant pleural effusion or pneumothorax. IMPRESSION: Cardiomegaly. Increased  interstitial markings are seen in the left parahilar region and both lower lung fields suggesting possible multifocal pneumonia. Evaluation of lung fields is limited by poor inspiration. There is no pleural effusion. Electronically Signed   By: Elmer Picker M.D.   On: 08/13/2021 18:26      LOS: 138 days    Cordelia Poche, MD [facility] 08/15/2021, 2:23 PM   If 7PM-7AM, please contact night-coverage [email or web address]

## 2021-08-15 NOTE — Progress Notes (Signed)
During trach care and assessment patient vomited.  Chest vest being held due to this.

## 2021-08-15 NOTE — Progress Notes (Signed)
Nutrition Follow-up  DOCUMENTATION CODES:   Not applicable  INTERVENTION:  Once able to resume tube feeds,  Continue tube feeding via PEG:  1.5 cartons of Osmolite 1.5 - QID (355 mL each bolus) 45 mL ProSource TF given TID 300 mL free water q4h  Provides 2250 kcal, 122 gm PRO, and 1086 mL free water (2879 mL total free water) daily.    Continue Juven (or equivalent) BID per tube, each packet provides 80 calories, 8 grams of carbohydrate, 2.5 grams of protein to promote wound healing  NUTRITION DIAGNOSIS:   Inadequate oral intake related to inability to eat as evidenced by NPO status; ongoing  GOAL:   Patient will meet greater than or equal to 90% of their needs; met with TF  MONITOR:   Labs, Weight trends, TF tolerance, Skin, I & O's  REASON FOR ASSESSMENT:   Ventilator, Consult Enteral/tube feeding initiation and management  ASSESSMENT:   51 year old male who originally presented to [facility] on 1/19 after out-of-hospital cardiac arrest due to suspected angioedema from ACE inhibitor leading to asphyxiation. PMH of HTN, HLD.  2/11 - tracheostomy placed 2/22 - PEG placed   Pt continues on trach collar. Pt with emesis this morning. Tube feeds currently held. Once able to resume feedings, recommend continuation of feeding regimen via PEG. Wife prefers pt discharge to home with private duty nursing care. Wife refusing SNF placement.   Labs and medications reviewed.   Diet Order:  NPO Diet Order     None       EDUCATION NEEDS:   Not appropriate for education at this time  Skin:  Skin Assessment: Skin Integrity Issues: Skin Integrity Issues:: Other (Comment) DTI: N/A Stage II: N/A Stage III: N/A Unstageable: N/A Other: MASD buttocks, wound R foot  Last BM:  6/12  Height:   Ht Readings from Last 1 Encounters:  04/18/21 _0  (1.778 m)    Weight:   Wt Readings from Last 1 Encounters:  08/15/21 96 kg   BMI:  Body mass index is 30.37 kg/m.  Estimated  Nutritional Needs:   Kcal:  2200-2400  Protein:  110-125 grams  Fluid:  >/= 2.2 L  Corrin Parker, MS, RD, LDN RD pager number/after hours weekend pager number on Amion.

## 2021-08-16 DIAGNOSIS — E669 Obesity, unspecified: Secondary | ICD-10-CM

## 2021-08-16 DIAGNOSIS — R1110 Vomiting, unspecified: Secondary | ICD-10-CM

## 2021-08-16 LAB — BASIC METABOLIC PANEL
Anion gap: 8 (ref 5–15)
BUN: 42 mg/dL — ABNORMAL HIGH (ref 6–20)
CO2: 28 mmol/L (ref 22–32)
Calcium: 9.7 mg/dL (ref 8.9–10.3)
Chloride: 101 mmol/L (ref 98–111)
Creatinine, Ser: 0.72 mg/dL (ref 0.61–1.24)
GFR, Estimated: >60 mL/min (ref >60)
Glucose, Bld: 88 mg/dL (ref 70–99)
Potassium: 4.6 mmol/L (ref 3.5–5.1)
Sodium: 137 mmol/L (ref 135–145)

## 2021-08-16 LAB — CBC
HCT: 36.6 % — ABNORMAL LOW (ref 39.0–52.0)
Hemoglobin: 12.1 g/dL — ABNORMAL LOW (ref 13.0–17.0)
MCH: 30.3 pg (ref 26.0–34.0)
MCHC: 33.1 g/dL (ref 30.0–36.0)
MCV: 91.7 fL (ref 80.0–100.0)
Platelets: 217 10*3/uL (ref 150–400)
RBC: 3.99 MIL/uL — ABNORMAL LOW (ref 4.22–5.81)
RDW: 14.8 % (ref 11.5–15.5)
WBC: 9.8 10*3/uL (ref 4.0–10.5)
nRBC: 0 % (ref 0.0–0.2)

## 2021-08-16 NOTE — Progress Notes (Signed)
Patient has a strong cough and gag reflex. Patient able to cough up secretions from trach on his own. RT is in-line suctioning every 4 hours but no additional suctioning has been needed in several days. Suctioning of the mouth every 2 hours for oral hygiene and for minimal secretions.

## 2021-08-16 NOTE — Progress Notes (Signed)
PROGRESS NOTE    Billy Cherry  [id number] DOB: [birth year] DOA: 03/30/2021 PCP: Patient, No Pcp Per  No chief complaint on file.   Brief Narrative:  [name] 51 years old male with past medical history of hypertension who was brought into the hospital after being found unresponsive by his wife.  Patient was initially admitted to [facility] hospital for out of hospital cardiac arrest and ventricular fibrillation with respiratory arrest suspected from angioedema from ACE inhibitor leading to asphyxiation.  Patient was then subsequently transferred to [facility].  Below is the Sequence of events during hospitalization,  1/19 left heart cath without significant CAD.  Concern for anoxic brain injury 1/21: MRI shows areas of hypoxic-ischemic injury bilaterally. Fevered; cultures obtained. 1/23 severe brain damage 1/25: PEG . Neuro exam unchanged.  Does exhibit new eyelid tremor like movements and lip/jaw tremor like vs clonus movement.  Bolused Keppra and increased maintenance dose.  Repeat EEG. CTH: concerning for progression of anoxic injury.  1/26: Repeat EEG unchanged and continues to show myoclonic seizures despite addition of Valproic acid yesterday.  03/30/2021 Transfer to [name] from [place] for continuous EEG 1/28: Continues to have myoclonic sz on EEG 2/3 no meaningful neuro recovery. Diuresed 2/6 New fever to 102, WBC 16K - treated with vancomycin for 7d based on cultures.  2/11: percutaneous tracheostomy 2/13 >2/20 on trach collar. No vent.  2/20 Trach change to 6 cuffless 2/28: fever 3/13: eliquis started 4/3: fever 06/06/2021 CT of the chest with new PE and haziness consistent with pneumonia 4/5-4/9: responding well to PRN lasix 4/11 fever again after completion of abx- work up in progress 4/13 continues to have low-grade temps cultures from 4/11 no growth to date-suspect silent/occult aspiration-heparin changed back to Eliquis 4/13 4/14: Work-up negative with  no growth to date but red menance Tmax elevated 99.4 and respiratory rate up-x-ray did not show         any pneumonia but probably silently aspirating 08/04/21- ongoing.  Patient remains unresponsive.  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 RV systolic function is preserved. No significant valvular disease.  Continue with carvedilol, hydralazine, isosorbide and spironolactone.  Patient with very poor prognosis.   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 Continue supportive care. Patient currently keeps his eyes open, but does not answer questions or follow commands. Unchanged neurologic status based on prior notes. Continue with clonazepam, keppra, and valproic acid.  Neuro checks per unit protocol, aspiration precautions.    Acute respiratory failure ([name]) Status post trach.  Continue supportive care Managing secretions well.  Stable on trach collar at 5 L (28% FiO2) Patient has Jameia Makris cuff less trach #6, continue care per protocol.   Fever Last fever 6/13 at 5 am Blood cultures pending resp  culture pending CXR 6/11 concerning for possible multifocal pneumonia Follow on cefepime    HAP (hospital-acquired pneumonia) Previously had completed  vancomycin for MSSA and [name] in early march.  Received more than 7-day course of IV Fortaz. On 4/3, CT chest was ordered that indicated an area of alveolar infiltration that may represent pneumonia.  Tracheal aspirate has been sent for culture and is significant for normal respiratory flora. Concerning for aspiration. -s/p Unasyn x 7 days As above repeat fever on 6/13, CXR 6/11 concerning for pneumonia Continue on cefepime Follow blood cultures, sputum cx  Pulmonary embolus ([name]) Noted to have possible acute on chronic pulmonary embolism on CT.  Also have some evidence of possible pulmonary hypertension.   Currently patient is on apixaban with good toleration.   DVT (deep venous thrombosis) ([name]) Duplex ultrasound of lower extremity on 05/15/2021 showed age indeterminate deep vein thrombosis involving the left femoral vein, left proximal profunda vein, left posterior tibial veins, left popliteal vein, and left peroneal veins.  Currently continue anticoagulation with apixaban.    Emesis Resolved, monitor on tube feeds  Dysphagia Continue PEG tube feeding.  Tolerating but high risk of aspiration -Aspiration precautions  Pressure injury of skin Lateral penis, posterior right heel, right foot. Not present on admission. Pressure Injury 08/16/21 Foot Right;Upper;Posterior Stage 1 -  Intact skin with non-blanchable redness of Draya Felker localized area usually over [place] Flight bony prominence. (Active)  08/16/21 1629  Location: Foot  Location Orientation: Right;Upper;Posterior  Staging: Stage 1 -  Intact skin with non-blanchable redness of Kaven Cumbie localized area usually over Daleiza Bacchi bony prominence.  Wound Description (Comments):   Present on Admission:      Pressure Injury 08/16/21 Heel Right Stage 1 -  Intact skin with non-blanchable redness of Kaleisha Bhargava localized area usually over Raif Chachere bony prominence. (Active)  08/16/21 1632  Location: Heel  Location Orientation: Right  Staging: Stage 1 -  Intact skin  with non-blanchable redness of Aleksa Catterton localized area usually over Denilson Salminen bony prominence.  Wound Description (Comments):   Present on Admission:       HTN (hypertension) Continue blood pressure control with amlodipine, carvedilol, hydralazine, isosorbide and spironolactone.   HFrEF (heart failure with reduced ejection fraction) ([place]) 2D echocardiogram-LVEF < 20%,  Patient euvolemic, continue medical therapy with carvedilol, isosorbide/ hydralazine and spironolactone As needed furosemide.   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  Continue current AED with Klonopin, Keppra, valproic acid. Per neuro notes, suppressing his myoclonus will not ultimately change the prognosis   Hypernatremia-resolved as of 08/07/2021 Resolved with free water flushes  Obesity (BMI 30-39.9) noted          DVT prophylaxis: eliquis Code Status: full Family Communication: none Disposition:   Status is: Inpatient Remains inpatient appropriate because: continued need for abx, placement   Consultants:  ID Palliative Neuro Cards PCCM  Procedures:  Trach peg  Antimicrobials:  Anti-infectives (From admission, onward)    Start     Dose/Rate Route Frequency Ordered Stop   08/13/21 2000  ceFEPIme (MAXIPIME) 2 g in sodium chloride 0.9 % 100 mL IVPB        2 g 200 mL/hr over 30 Minutes Intravenous Every 8 hours 08/13/21 1855     06/07/21 1500  Ampicillin-Sulbactam (UNASYN) 3 g in sodium chloride 0.9 % 100 mL IVPB        3 g 200 mL/hr over 30 Minutes Intravenous Every 6 hours 06/07/21 1320 06/12/21 1032   06/06/21 0745  ceFEPIme (MAXIPIME) 2 g in sodium chloride  0.9 % 100 mL IVPB  Status:  Discontinued        2 g 200 mL/hr over 30 Minutes Intravenous Every 8 hours 06/06/21 0654 06/07/21 1312   05/09/21 0800  vancomycin (VANCOREADY) IVPB 1250 mg/250 mL  Status:  Discontinued        1,250 mg 166.7 mL/hr over 90 Minutes Intravenous Every 12 hours 05/08/21 1621 05/09/21 0954   05/08/21 1715  vancomycin (VANCOREADY)  IVPB 2000 mg/400 mL        2,000 mg 200 mL/hr over 120 Minutes Intravenous  Once 05/08/21 1618 05/08/21 1943   05/02/21 1000  cefTAZidime (FORTAZ) 2 g in sodium chloride 0.9 % 100 mL IVPB  Status:  Discontinued        2 g 200 mL/hr over 30 Minutes Intravenous Every 8 hours 05/02/21 0846 05/10/21 0936   04/26/21 0756  ceFAZolin (ANCEF) 2-4 GM/100ML-% IVPB  Status:  Discontinued       Note to Pharmacy: [name] L: cabinet override      04/26/21 0756 04/26/21 0855   04/26/21 0600  ceFAZolin (ANCEF) IVPB 2g/100 mL premix        2 g 200 mL/hr over 30 Minutes Intravenous Every 8 hours 04/25/21 1131 04/26/21 0915   04/23/21 1400  cefTAZidime (FORTAZ) 1 g in sodium chloride 0.9 % 100 mL IVPB        1 g 200 mL/hr over 30 Minutes Intravenous Every 8 hours 04/23/21 1256 04/25/21 2331   04/20/21 1300  ceFEPIme (MAXIPIME) 2 g in sodium chloride 0.9 % 100 mL IVPB  Status:  Discontinued        2 g 200 mL/hr over 30 Minutes Intravenous Every 8 hours 04/20/21 1205 04/23/21 1241   04/14/21 0930  vancomycin (VANCOREADY) IVPB 1250 mg/250 mL        1,250 mg 166.7 mL/hr over 90 Minutes Intravenous Every 24 hours 04/13/21 0902 04/19/21 1247   04/13/21 0930  vancomycin (VANCOREADY) IVPB 2000 mg/400 mL        2,000 mg 200 mL/hr over 120 Minutes Intravenous  Once 04/13/21 0831 04/13/21 1158   04/12/21 0815  ceFEPIme (MAXIPIME) 2 g in sodium chloride 0.9 % 100 mL IVPB  Status:  Discontinued        2 g 200 mL/hr over 30 Minutes Intravenous Every 8 hours 04/12/21 0724 04/13/21 0829   04/06/21 1800  ceFAZolin (ANCEF) IVPB 2g/100 mL premix        2 g 200 mL/hr over 30 Minutes Intravenous Every 8 hours 04/06/21 1403 04/10/21 1913   04/04/21 1000  ceFEPIme (MAXIPIME) 2 g in sodium chloride 0.9 % 100 mL IVPB  Status:  Discontinued        2 g 200 mL/hr over 30 Minutes Intravenous Every 8 hours 04/04/21 0919 04/06/21 1403       Subjective: Nonverbal   Objective: Vitals:   08/16/21 1107 08/16/21 1117  08/16/21 1400 08/16/21 1504  BP: 120/79  90/60   Pulse: 93 94  (!) 102  Resp: 19 19  18   Temp: 98.9 F (37.2 C)     TempSrc: Oral     SpO2: 97% 96%  95%  Weight:      Height:        Intake/Output Summary (Last 24 hours) at 08/16/2021 1637 Last data filed at 08/16/2021 1540 Gross per 24 hour  Intake 520 ml  Output 1825 ml  Net -1305 ml   Filed Weights   08/13/21 0542 08/15/21 0100 08/16/21 0353  Weight: 96 kg 96 kg 97 kg    Examination:  General exam: Appears calm and comfortable  Respiratory system: trach, unlabored Cardiovascular system: RRR Gastrointestinal system: Abdomen is nondistended, soft and nontender. peg Central nervous system: opens eyes to touch, no meaningful or intentional movements appreciated, not following commands Extremities: no LEE   Data Reviewed: I have personally reviewed following labs and imaging studies  CBC: Recent Labs  Lab 08/13/21 1637 08/16/21 0617  WBC 9.8 9.8  HGB 13.8 12.1*  HCT 41.5 36.6*  MCV 91.6 91.7  PLT 272 [id number]    Basic Metabolic Panel: Recent Labs  Lab 08/13/21 1637 08/16/21 0617  NA 133* 137  K 4.7 4.6  CL 96* 101  CO2 27 28  GLUCOSE 127* 88  BUN 40* 42*  CREATININE 0.63 0.72  CALCIUM 9.6 9.7    GFR: Estimated Creatinine Clearance: 129.1 mL/min (by C-G formula based on SCr of 0.72 mg/dL).  Liver Function Tests: Recent Labs  Lab 08/13/21 1637  AST 18  ALT 15  ALKPHOS 61  BILITOT 0.3  PROT 7.1  ALBUMIN 2.6*    CBG: No results for input(s): "GLUCAP" in the last 168 hours.   Recent Results (from the past 240 hour(s))  Culture, blood (Routine X 2) w Reflex to ID Panel     Status: None (Preliminary result)   Collection Time: 08/15/21  3:11 PM   Specimen: BLOOD LEFT HAND  Result Value Ref Range Status   Specimen Description BLOOD LEFT HAND  Final   Special Requests   Final    BOTTLES DRAWN AEROBIC AND ANAEROBIC Blood Culture adequate volume   Culture   Final    NO GROWTH < 24 HOURS Performed  at [facility], [address], [place], [place] [address]    Report Status PENDING  Incomplete  Culture, blood (Routine X 2) w Reflex to ID Panel     Status: None (Preliminary result)   Collection Time: 08/15/21  3:12 PM   Specimen: BLOOD  Result Value Ref Range Status   Specimen Description BLOOD RIGHT ANTECUBITAL  Final   Special Requests   Final    BOTTLES DRAWN AEROBIC AND ANAEROBIC Blood Culture adequate volume   Culture   Final    NO GROWTH < 24 HOURS Performed at [facility], [place] [address] [place], [place] [address]    Report Status PENDING  Incomplete         Radiology Studies: DG Abd Portable 1V  Result Date: 08/15/2021 CLINICAL DATA:  Emesis. EXAM: PORTABLE ABDOMEN - 1 VIEW COMPARISON:  Abdominal radiographs 03/31/2021.  CT 04/14/2021. FINDINGS: 1437 hours. Interval percutaneous G-tube placement with retention balloon projecting over the mid stomach. The bowel gas pattern appears nonobstructive. No unexpected foreign body, suspicious abdominal calcification or significant osseous abnormality identified. IMPRESSION: No radiographic abnormality of the abdomen following G-tube placement. Electronically Signed   By: Richardean Sale M.D.   On: 08/15/2021 14:47        Scheduled Meds:  apixaban  5 mg Per Tube BID   atropine  2 drop Sublingual QID   carvedilol  25 mg Per Tube BID   chlorhexidine  15 mL Mouth/Throat BID   clonazepam  0.5 mg Per Tube TID   doxazosin  2 mg Per Tube Daily   feeding supplement (OSMOLITE 1.5 CAL)  355 mL Per Tube QID   feeding supplement (PROSource TF)  45 mL Per Tube TID   fiber  1 packet Per  Tube BID   free water  200 mL Per Tube Q6H   Gerhardt's butt cream   Topical Daily   glycopyrrolate  1 mg Per Tube TID   hydrALAZINE  100 mg Per Tube Q8H   isosorbide dinitrate  30 mg Per Tube TID   levETIRAcetam  750 mg Per Tube BID   mouth rinse  15 mL Mouth Rinse q12n4p   nutrition supplement (JUVEN)  1 packet Per Tube BID BM    pantoprazole sodium  40 mg Per Tube QHS   scopolamine  1 patch Transdermal Q72H   spironolactone  25 mg Per Tube Daily   valproic acid  750 mg Per Tube TID   Continuous Infusions:  ceFEPime (MAXIPIME) IV 2 g (08/16/21 1231)     LOS: 139 days    Time spent: over 30 min    Fayrene Helper, MD [facility]   To contact the attending provider between 7A-7P or the covering provider during after hours 7P-7A, please log into the web site [email or web address] and access using universal [place] password for that web site. If you do not have the password, please call the hospital operator.  08/16/2021, 4:37 PM

## 2021-08-16 NOTE — Progress Notes (Signed)
Pharmacy Antibiotic Note- follow-up  Billy Cherry is a 51 y.o. male transferred to [facility] on 03/30/2021 with seizure requiring EEG monitoring.  Throughout the extended LOS, patient has completed several courses of antibiotics for PNA.  Pharmacy has been consulted for cefepime dosing for possible multifocal PNA on CXR.  He has a trach.  Renal function stable, afebrile, WBC WNL.  Plan: Continue Cefepime 2gm IV Q8H Monitor renal fxn, micro data  Height:  (bed scale not working) Weight: 97 kg (213 lb 13.5 oz) IBW/kg (Calculated) : 73  Temp (24hrs), Avg:98.9 F (37.2 C), Min:98.2 F (36.8 C), Max:99.3 F (37.4 C)  Recent Labs  Lab 08/13/21 1637 08/16/21 0617  WBC 9.8 9.8  CREATININE 0.63 0.72     Estimated Creatinine Clearance: 129.1 mL/min (by C-G formula based on SCr of 0.72 mg/dL).    Allergies  Allergen Reactions   Lisinopril Swelling    angioedema   Cefepime 4/4>>4/5, 6/11 >> Unasyn 4/5 >> 4/10   3/6: MRSE bacteremia (contaminant) 4/15 BCx x1: negative 4/11 BCx: negative 4/11 UA negative 4/4 bld: negative 6/13 Bcx- in process  Vaughan Basta BS, PharmD, BCPS Clinical Pharmacist 08/16/2021 8:22 AM  Contact: [phone] after 3 PM  "Be curious, not judgmental..." -Jamal Maes

## 2021-08-16 NOTE — TOC Progression Note (Addendum)
Transition of Care [facility]) - Progression Note    Patient Details  Name: Billy Cherry MRN: [id number] Date of Birth: 03-04-1971  Transition of Care [facility]) CM/SW Contact  Janae Bridgeman, RN Phone Number: 08/16/2021, 10:13 AM  Clinical Narrative:    CM with DTP Team is continuing to follow the patient for [facility] care needs and placement.  Family meeting is scheduled for 08/17/2021 at 11 am with the patient's wife and hospital leadership.  The patient currently has fever, and is being treated for IV antibiotics Cefepime starting 6/11 per notes.    CM spoke with Alaina, RN at bed side and per her documentation - patient has a strong cough and gag reflex. Patient able to cough up secretions from trach on his own. RT is in-line suctioning every 4 hours but no additional suctioning has been needed in several days. Suctioning of the mouth every 2 hours for oral hygiene and for minimal secretions.   Dr. Lowell Guitar, attending physician to document progress notes for today and once this is obtained - I will reach out to [facility], [facility] Admissions at [facility] to follow up to see if the facility is able to offer a bed for placement.  CM will continue to follow and communicate with the patient's wife if alternative placement means become available.   I called and spoke with Jess Barters, Admission CM with [facility] duty and he states that they currently do not have staffing availability to cover for private duty hours in the home.  They are continuing to recruit for availability of staff and at this time they have found 1 LPN that will be unable to start care until 08/28/21.  [facility] Shore is currently interviewing another licensed RN/LPN but has no firm hours for RN/LPN coverage in the home.  Maxim states that they call the patient's wife, [name] once a week to update her on staffing availability for the home.  08/16/2021 1348 - CM spoke with Jiles Crocker, [facility] Supervisor and she states she spoke with  Olegario Messier, [facility] at [name] and the facility has declined a bed offer to the patient at this time.  I called and spoke with Adela Glimpse, Admissions director with [facility] facility and they are willing to review the patient for possible bed offer - clinicals sent to [email or web address] for review.  CM and MSW with DTP Team will continue to follow the patient for [facility] needs - Family Care meeting scheduled for 08/17/2021 at 11 am.   Expected Discharge Plan: Home w Home Health Services Barriers to Discharge: Other (must enter comment) (Patient's wife - requested private duty services through alternative provider for 12 hours shifts for staffing at the home.)  Expected Discharge Plan and Services Expected Discharge Plan: Home w Home Health Services In-house Referral: Clinical Social Work Discharge Planning Services: CM Consult, Follow-up appt scheduled Post Acute Care Choice: Home Health Living arrangements for the past 2 months: Single Family Home Expected Discharge Date: 08/07/21               DME Arranged: Janina Mayo supplies, Tube feeding, Suction, Hospital bed, Oxygen DME Agency: [employer]       HH Arranged: RN (Private Duty Nursing through [employer] versus [place]) HH Agency: [employer] Services Date HH Agency Contacted: 07/04/21 Time HH Agency Contacted: 1000 Representative spoke with at [facility] Agency: Jeanmarie Plant, Director with [employer] - [phone]   Social Determinants of Health (SDOH) Interventions    Readmission Risk Interventions    04/14/2021  11:14 AM  Readmission Risk Prevention Plan  Transportation Screening Complete  HRI or Home Care Consult Complete  Social Work Consult for Recovery Care Planning/Counseling Complete  Palliative Care Screening Complete  Medication Review [employer]) Complete

## 2021-08-16 NOTE — Assessment & Plan Note (Signed)
Resolved, monitor on tube feeds

## 2021-08-16 NOTE — Assessment & Plan Note (Addendum)
BMI 31 °

## 2021-08-17 LAB — COMPREHENSIVE METABOLIC PANEL
ALT: 14 U/L (ref 0–44)
AST: 18 U/L (ref 15–41)
Albumin: 2.2 g/dL — ABNORMAL LOW (ref 3.5–5.0)
Alkaline Phosphatase: 43 U/L (ref 38–126)
Anion gap: 7 (ref 5–15)
BUN: 45 mg/dL — ABNORMAL HIGH (ref 6–20)
CO2: 30 mmol/L (ref 22–32)
Calcium: 9.5 mg/dL (ref 8.9–10.3)
Chloride: 103 mmol/L (ref 98–111)
Creatinine, Ser: 0.76 mg/dL (ref 0.61–1.24)
GFR, Estimated: >60 mL/min (ref >60)
Glucose, Bld: 94 mg/dL (ref 70–99)
Potassium: 4.3 mmol/L (ref 3.5–5.1)
Sodium: 140 mmol/L (ref 135–145)
Total Bilirubin: 0.5 mg/dL (ref 0.3–1.2)
Total Protein: 6.5 g/dL (ref 6.5–8.1)

## 2021-08-17 LAB — CBC WITH DIFFERENTIAL/PLATELET
Abs Immature Granulocytes: 0.02 10*3/uL (ref 0.00–0.07)
Basophils Absolute: 0 10*3/uL (ref 0.0–0.1)
Basophils Relative: 0 %
Eosinophils Absolute: 0.1 10*3/uL (ref 0.0–0.5)
Eosinophils Relative: 1 %
HCT: 36.5 % — ABNORMAL LOW (ref 39.0–52.0)
Hemoglobin: 11.8 g/dL — ABNORMAL LOW (ref 13.0–17.0)
Immature Granulocytes: 0 %
Lymphocytes Relative: 19 %
Lymphs Abs: 1.4 10*3/uL (ref 0.7–4.0)
MCH: 29.9 pg (ref 26.0–34.0)
MCHC: 32.3 g/dL (ref 30.0–36.0)
MCV: 92.4 fL (ref 80.0–100.0)
Monocytes Absolute: 1.1 10*3/uL — ABNORMAL HIGH (ref 0.1–1.0)
Monocytes Relative: 15 %
Neutro Abs: 4.5 10*3/uL (ref 1.7–7.7)
Neutrophils Relative %: 65 %
Platelets: 242 10*3/uL (ref 150–400)
RBC: 3.95 MIL/uL — ABNORMAL LOW (ref 4.22–5.81)
RDW: 14.6 % (ref 11.5–15.5)
WBC: 7 10*3/uL (ref 4.0–10.5)
nRBC: 0 % (ref 0.0–0.2)

## 2021-08-17 LAB — PHOSPHORUS: Phosphorus: 3.5 mg/dL (ref 2.5–4.6)

## 2021-08-17 LAB — MAGNESIUM: Magnesium: 2 mg/dL (ref 1.7–2.4)

## 2021-08-17 NOTE — Progress Notes (Signed)
Patients wife supposed to attend a meeting with leadership, bedside nurse, and case management at 1100 but did not show up for scheduled meeting.

## 2021-08-17 NOTE — Plan of Care (Signed)
  Problem: Pain Managment: Goal: General experience of comfort will improve Outcome: Completed/Met

## 2021-08-17 NOTE — TOC Progression Note (Addendum)
Transition of Care [facility]) - Progression Note    Patient Details  Name: Billy Cherry MRN: [id number] Date of Birth: [birth year]  Transition of Care [facility]) CM/SW Contact  Janae Bridgeman, RN Phone Number: 08/17/2021, 11:25 AM  Clinical Narrative:    Patient's wife was scheduled to meet with CM and hospital leadership at the bedside for family care meeting at 11 am but did not appear for the scheduled meeting.  08/17/2021 1530 - The patient's wife called on the phone and states that she missed the family meeting today at 11 am.  She states that her air conditioning unit stopped today and she was unable to meet leadership at the hospital to discuss patient's discharge plans.  The patient's wife states that she is reconsidering SNf placement at [facility] and states that she is visiting the facility this weekend with the patient's mother and will follow up with CM to accept the offer or not.  I did advise her that I have reached out to [facility] as well if they are able to offer a bed to the patient.  The patient's wife did not give a definitive answer but I will continue to communicate with her regarding SNF placement since [facility] is currently hiring nursing staff for the home but does not have availability to hours at the home at this time.  CM and MSW will continue to follow the patient for placement needs.   Expected Discharge Plan: Home w Home Health Services Barriers to Discharge: Other (must enter comment) (Patient's wife - requested private duty services through alternative provider for 12 hours shifts for staffing at the home.)  Expected Discharge Plan and Services Expected Discharge Plan: Home w Home Health Services In-house Referral: Clinical Social Work Discharge Planning Services: CM Consult, Follow-up appt scheduled Post Acute Care Choice: Home Health Living arrangements for the past 2 months: Single Family Home Expected Discharge Date: 08/07/21                DME Arranged: Janina Mayo supplies, Tube feeding, Suction, Hospital bed, Oxygen DME Agency: [employer]       HH Arranged: RN (Private Duty Nursing through [employer] versus [place]) HH Agency: [employer] [facility] Date HH Agency Contacted: 07/04/21 Time HH Agency Contacted: 1000 Representative spoke with at [facility] Agency: Jeanmarie Plant, Director with [employer] [phone]   Social Determinants of Health (SDOH) Interventions    Readmission Risk Interventions    04/14/2021   11:14 AM  Readmission Risk Prevention Plan  Transportation Screening Complete  HRI or Home Care Consult Complete  Social Work Consult for Recovery Care Planning/Counseling Complete  Palliative Care Screening Complete  Medication Review Oceanographer) Complete

## 2021-08-17 NOTE — TOC Progression Note (Signed)
Transition of Care [facility]) - Progression Note    Patient Details  Name: Billy Cherry MRN: [id number] Date of Birth: [birth year]  Transition of Care [facility]) CM/SW Contact  Janae Bridgeman, RN Phone Number: 08/17/2021, 8:57 AM  Clinical Narrative:    Family meeting is scheduled with the patient's wife at 76 am today to discuss transitions of care needs.  Bedside nursing is aware.  I called and left a voicemail message and secure email with Adela Glimpse, Admission director with [facility] to follow up regarding review of clinicals for SNF placement.  CM and MSW with DTP Team will continue to follow the patient for TOC needs.   Expected Discharge Plan: Home w Home Health Services Barriers to Discharge: Other (must enter comment) (Patient's wife - requested private duty services through alternative provider for 12 hours shifts for staffing at the home.)  Expected Discharge Plan and Services Expected Discharge Plan: Home w Home Health Services In-house Referral: Clinical Social Work Discharge Planning Services: CM Consult, Follow-up appt scheduled Post Acute Care Choice: Home Health Living arrangements for the past 2 months: Single Family Home Expected Discharge Date: 08/07/21               DME Arranged: Janina Mayo supplies, Tube feeding, Suction, Hospital bed, Oxygen DME Agency: [employer]       HH Arranged: RN (Private Duty Nursing through [employer] versus [place]) HH Agency: [employer] [facility] Date HH Agency Contacted: 07/04/21 Time HH Agency Contacted: 1000 Representative spoke with at [facility] Agency: Jeanmarie Plant, Director with [employer] [phone]   Social Determinants of Health (SDOH) Interventions    Readmission Risk Interventions    04/14/2021   11:14 AM  Readmission Risk Prevention Plan  Transportation Screening Complete  HRI or Home Care Consult Complete  Social Work Consult for Recovery Care Planning/Counseling Complete  Palliative Care  Screening Complete  Medication Review Oceanographer) Complete

## 2021-08-17 NOTE — Progress Notes (Signed)
PROGRESS NOTE    Billy Cherry  UVO:[id number] DOB: 1970-09-11 DOA: 03/30/2021 PCP: Patient, No Pcp Per  No chief complaint on file.   Brief Narrative:  [name] 51 years old male with past medical history of hypertension who was brought into the hospital after being found unresponsive by his wife.  Patient was initially admitted to [facility] hospital for out of hospital cardiac arrest and ventricular fibrillation with respiratory arrest suspected from angioedema from ACE inhibitor leading to asphyxiation.  Patient was then subsequently transferred to [facility].  Below is the Sequence of events during hospitalization,  1/19 left heart cath without significant CAD.  Concern for anoxic brain injury 1/21: MRI shows areas of hypoxic-ischemic injury bilaterally. Fevered; cultures obtained. 1/23 severe brain damage 1/25: PEG . Neuro exam unchanged.  Does exhibit new eyelid tremor like movements and lip/jaw tremor like vs clonus movement.  Bolused Keppra and increased maintenance dose.  Repeat EEG. CTH: concerning for progression of anoxic injury.  1/26: Repeat EEG unchanged and continues to show myoclonic seizures despite addition of Valproic acid yesterday.  03/30/2021 Transfer to [facility] from [place] for continuous EEG 1/28: Continues to have myoclonic sz on EEG 2/3 no meaningful neuro recovery. Diuresed 2/6 New fever to 102, WBC 16K - treated with vancomycin for 7d based on cultures.  2/11: percutaneous tracheostomy 2/13 >2/20 on trach collar. No vent.  2/20 Trach change to 6 cuffless 2/28: fever 3/13: eliquis started 4/3: fever 06/06/2021 CT of the chest with new PE and haziness consistent with pneumonia 4/5-4/9: responding well to PRN lasix 4/11 fever again after completion of abx- work up in progress 4/13 continues to have low-grade temps cultures from 4/11 no growth to date-suspect silent/occult aspiration-heparin changed back to Eliquis 4/13 4/14: Work-up negative with  no growth to date but red menance Tmax elevated 99.4 and respiratory rate up-x-ray did not show         any pneumonia but probably silently aspirating 08/04/21- ongoing.  Patient remains unresponsive.  Awaiting for placement and disposition has been difficult     Assessment & Plan:   Principal Problem:   Cardiac arrest [facility]) Active Problems:   Anoxic encephalopathy ([name])   Acute respiratory failure ([name])   HAP (hospital-acquired pneumonia)   Fever   DVT (deep venous thrombosis) ([name])   Pulmonary embolus ([name])   Dysphagia   Emesis   HTN (hypertension)   Pressure injury of skin   HFrEF (heart failure with reduced ejection fraction) ([name])   Myoclonus   Obesity (BMI 30-39.9)   Assessment and Plan: * Cardiac arrest ([name]) Suspected in the setting of angioedema from ace leading to asphyxiation S/p trach/peg Complicated by anoxic encephalopathy as above Echocardiogram with reduced LV systolic function <20%, with global hypokinesis. RV systolic function is preserved. No significant valvular disease.  Continue with carvedilol, hydralazine, isosorbide and spironolactone.  Patient with very poor prognosis.   Anoxic encephalopathy ([name]) MRI brain significant for hypoxic brain injury. Continue supportive care. Patient currently keeps his eyes open, but does not answer questions or follow commands. Unchanged neurologic status based on prior notes - pupils fixed, dilated (noted in prior notes). Continue with clonazepam, keppra, and valproic acid.  Neuro checks per unit protocol, aspiration precautions.    Acute respiratory failure ([name]) Status post trach.  Continue supportive care Managing secretions well.  Stable on trach collar at 5 L (28% FiO2) Patient has Nyshawn Gowdy cuff less trach #6, continue care per protocol.   Fever Last fever  6/13 at 5 am Blood cultures pending resp culture pending CXR 6/11 concerning for possible multifocal pneumonia Follow on cefepime    HAP  (hospital-acquired pneumonia) Previously had completed vancomycin for MSSA and [name] in early march.  Received more than 7-day course of IV Fortaz. On 4/3, CT chest was ordered that indicated an area of alveolar infiltration that may represent pneumonia.  Tracheal aspirate has been sent for culture and is significant for normal respiratory flora. Concerning for aspiration. -s/p Unasyn x 7 days As above repeat fever on 6/13, CXR 6/11 concerning for pneumonia Continue on cefepime Follow blood cultures NGx2, sputum cx pending collection  Pulmonary embolus ([name]) Noted to have possible acute on chronic pulmonary embolism on CT.  Also have some evidence of possible pulmonary hypertension.   Currently patient is on apixaban with good toleration.   DVT (deep venous thrombosis) ([name]) Duplex ultrasound of lower extremity on 05/15/2021 showed age indeterminate deep vein thrombosis involving the left femoral vein, left proximal profunda vein, left posterior tibial veins, left popliteal vein, and left peroneal veins.  Currently continue anticoagulation with apixaban.    Emesis Resolved, monitor on tube feeds  Dysphagia Continue PEG tube feeding.  Tolerating but high risk of aspiration -Aspiration precautions  Pressure injury of skin Lateral penis, posterior right heel, right foot. Not present on admission. Pressure Injury 08/16/21 Foot Right;Upper;Posterior Stage 1 -  Intact skin with non-blanchable redness of Kacy Conely localized area usually over Dalton Mille bony prominence. (Active)  08/16/21 1629  Location: Foot  Location Orientation: Right;Upper;Posterior  Staging: Stage 1 -  Intact skin with non-blanchable redness of Arihana Ambrocio localized area usually over Reet Scharrer bony prominence.  Wound Description (Comments):   Present on Admission:      Pressure Injury 08/16/21 Heel Right Stage 1 -  Intact skin with non-blanchable redness of Leibish Mcgregor localized area usually over Jacquelinne Speak bony prominence. (Active)  08/16/21 1632   Location: Heel  Location Orientation: Right  Staging: Stage 1 -  Intact skin with non-blanchable redness of Keilyn Nadal localized area usually over Ruhama Lehew bony prominence.  Wound Description (Comments):   Present on Admission:       HTN (hypertension) Continue blood pressure control with amlodipine, carvedilol, hydralazine, isosorbide and spironolactone.   HFrEF (heart failure with reduced ejection fraction) ([name]) 2D echocardiogram-LVEF < 20%,  Patient euvolemic, continue medical therapy with carvedilol, isosorbide/ hydralazine and spironolactone As needed furosemide.   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  Continue current AED with Klonopin, Keppra, valproic acid. Per neuro notes, suppressing his myoclonus will not ultimately change the prognosis   Hypernatremia-resolved as of 08/07/2021 Resolved with free water flushes  Obesity (BMI 30-39.9) noted      DVT prophylaxis: eliquis Code Status: full Family Communication: none Disposition:   Status is: Inpatient Remains inpatient appropriate because: continued need for abx, placement   Consultants:  ID Palliative Neuro Cards PCCM  Procedures:  Trach peg  Antimicrobials:  Anti-infectives (From admission, onward)    Start     Dose/Rate Route Frequency Ordered Stop   08/13/21 2000  ceFEPIme (MAXIPIME) 2 g in sodium chloride 0.9 % 100 mL IVPB        2 g 200 mL/hr over 30 Minutes Intravenous Every 8 hours 08/13/21 1855     06/07/21 1500  Ampicillin-Sulbactam (UNASYN) 3 g in sodium chloride 0.9 % 100 mL IVPB        3 g 200 mL/hr over 30 Minutes Intravenous Every 6 hours 06/07/21 1320 06/12/21 1032   06/06/21 0745  ceFEPIme (MAXIPIME) 2 g in sodium chloride 0.9 % 100 mL IVPB  Status:  Discontinued        2 g 200 mL/hr over 30 Minutes Intravenous Every 8 hours 06/06/21 0654 06/07/21 1312   05/09/21 0800  vancomycin (VANCOREADY) IVPB 1250 mg/250 mL  Status:  Discontinued        1,250 mg 166.7 mL/hr over 90 Minutes Intravenous Every 12  hours 05/08/21 1621 05/09/21 0954   05/08/21 1715  vancomycin (VANCOREADY) IVPB 2000 mg/400 mL        2,000 mg 200 mL/hr over 120 Minutes Intravenous  Once 05/08/21 1618 05/08/21 1943   05/02/21 1000  cefTAZidime (FORTAZ) 2 g in sodium chloride 0.9 % 100 mL IVPB  Status:  Discontinued        2 g 200 mL/hr over 30 Minutes Intravenous Every 8 hours 05/02/21 0846 05/10/21 0936   04/26/21 0756  ceFAZolin (ANCEF) 2-4 GM/100ML-% IVPB  Status:  Discontinued       Note to Pharmacy: [name] L: cabinet override      04/26/21 0756 04/26/21 0855   04/26/21 0600  ceFAZolin (ANCEF) IVPB 2g/100 mL premix        2 g 200 mL/hr over 30 Minutes Intravenous Every 8 hours 04/25/21 1131 04/26/21 0915   04/23/21 1400  cefTAZidime (FORTAZ) 1 g in sodium chloride 0.9 % 100 mL IVPB        1 g 200 mL/hr over 30 Minutes Intravenous Every 8 hours 04/23/21 1256 04/25/21 2331   04/20/21 1300  ceFEPIme (MAXIPIME) 2 g in sodium chloride 0.9 % 100 mL IVPB  Status:  Discontinued        2 g 200 mL/hr over 30 Minutes Intravenous Every 8 hours 04/20/21 1205 04/23/21 1241   04/14/21 0930  vancomycin (VANCOREADY) IVPB 1250 mg/250 mL        1,250 mg 166.7 mL/hr over 90 Minutes Intravenous Every 24 hours 04/13/21 0902 04/19/21 1247   04/13/21 0930  vancomycin (VANCOREADY) IVPB 2000 mg/400 mL        2,000 mg 200 mL/hr over 120 Minutes Intravenous  Once 04/13/21 0831 04/13/21 1158   04/12/21 0815  ceFEPIme (MAXIPIME) 2 g in sodium chloride 0.9 % 100 mL IVPB  Status:  Discontinued        2 g 200 mL/hr over 30 Minutes Intravenous Every 8 hours 04/12/21 0724 04/13/21 0829   04/06/21 1800  ceFAZolin (ANCEF) IVPB 2g/100 mL premix        2 g 200 mL/hr over 30 Minutes Intravenous Every 8 hours 04/06/21 1403 04/10/21 1913   04/04/21 1000  ceFEPIme (MAXIPIME) 2 g in sodium chloride 0.9 % 100 mL IVPB  Status:  Discontinued        2 g 200 mL/hr over 30 Minutes Intravenous Every 8 hours 04/04/21 0919 04/06/21 1403        Subjective: Nonverbal   Objective: Vitals:   08/17/21 0440 08/17/21 0725 08/17/21 1006 08/17/21 1200  BP: 110/84 120/87 107/77 112/81  Pulse: 98 99 96 95  Resp: 18 19  18   Temp: 98.1 F (36.7 C) 99.9 F (37.7 C)  99.2 F (37.3 C)  TempSrc: Oral Oral  Oral  SpO2: 95% 97%  98%  Weight: 97 kg     Height:        Intake/Output Summary (Last 24 hours) at 08/17/2021 1627 Last data filed at 08/17/2021 1200 Gross per 24 hour  Intake 355 ml  Output 950 ml  Net -595  ml   Filed Weights   08/15/21 0100 08/16/21 0353 08/17/21 0440  Weight: 96 kg 97 kg 97 kg    Examination:  General: No acute distress. Cardiovascular: RRR Lungs: unlabored Abdomen: Soft, nontender, nondistended Neurological: unable to examine pupils yesterday, resisted opening eyes, pupils fixed and dilated today (previously documented), doesn't follow commands Extremities: No clubbing or cyanosis. No edema.   Data Reviewed: I have personally reviewed following labs and imaging studies  CBC: Recent Labs  Lab 08/13/21 1637 08/16/21 0617 08/17/21 1205  WBC 9.8 9.8 7.0  NEUTROABS  --   --  4.5  HGB 13.8 12.1* 11.8*  HCT 41.5 36.6* 36.5*  MCV 91.6 91.7 92.4  PLT 272 217 [id number]    Basic Metabolic Panel: Recent Labs  Lab 08/13/21 1637 08/16/21 0617 08/17/21 1205  NA 133* 137 140  K 4.7 4.6 4.3  CL 96* 101 103  CO2 27 28 30   GLUCOSE 127* 88 94  BUN 40* 42* 45*  CREATININE 0.63 0.72 0.76  CALCIUM 9.6 9.7 9.5  MG  --   --  2.0  PHOS  --   --  3.5    GFR: Estimated Creatinine Clearance: 129.1 mL/min (by C-G formula based on SCr of 0.76 mg/dL).  Liver Function Tests: Recent Labs  Lab 08/13/21 1637 08/17/21 1205  AST 18 18  ALT 15 14  ALKPHOS 61 43  BILITOT 0.3 0.5  PROT 7.1 6.5  ALBUMIN 2.6* 2.2*    CBG: No results for input(s): "GLUCAP" in the last 168 hours.   Recent Results (from the past 240 hour(s))  Culture, blood (Routine X 2) w Reflex to ID Panel     Status: None  (Preliminary result)   Collection Time: 08/15/21  3:11 PM   Specimen: BLOOD LEFT HAND  Result Value Ref Range Status   Specimen Description BLOOD LEFT HAND  Final   Special Requests   Final    BOTTLES DRAWN AEROBIC AND ANAEROBIC Blood Culture adequate volume   Culture   Final    NO GROWTH 2 DAYS Performed at [facility], [address], [place], [place] [address]    Report Status PENDING  Incomplete  Culture, blood (Routine X 2) w Reflex to ID Panel     Status: None (Preliminary result)   Collection Time: 08/15/21  3:12 PM   Specimen: BLOOD  Result Value Ref Range Status   Specimen Description BLOOD RIGHT ANTECUBITAL  Final   Special Requests   Final    BOTTLES DRAWN AEROBIC AND ANAEROBIC Blood Culture adequate volume   Culture   Final    NO GROWTH 2 DAYS Performed at [name], [place] [address] [place], [place] [address]    Report Status PENDING  Incomplete         Radiology Studies: No results found.      Scheduled Meds:  apixaban  5 mg Per Tube BID   atropine  2 drop Sublingual QID   carvedilol  25 mg Per Tube BID   chlorhexidine  15 mL Mouth/Throat BID   clonazepam  0.5 mg Per Tube TID   doxazosin  2 mg Per Tube Daily   feeding supplement (OSMOLITE 1.5 CAL)  355 mL Per Tube QID   feeding supplement (PROSource TF)  45 mL Per Tube TID   fiber  1 packet Per Tube BID   free water  200 mL Per Tube Q6H   Gerhardt's butt cream   Topical Daily  glycopyrrolate  1 mg Per Tube TID   hydrALAZINE  100 mg Per Tube Q8H   isosorbide dinitrate  30 mg Per Tube TID   levETIRAcetam  750 mg Per Tube BID   mouth rinse  15 mL Mouth Rinse q12n4p   nutrition supplement (JUVEN)  1 packet Per Tube BID BM   pantoprazole sodium  40 mg Per Tube QHS   scopolamine  1 patch Transdermal Q72H   spironolactone  25 mg Per Tube Daily   valproic acid  750 mg Per Tube TID   Continuous Infusions:  ceFEPime (MAXIPIME) IV 2 g (08/17/21 1301)     LOS: 140 days    Time  spent: over 30 min    Fayrene Helper, MD [facility]   To contact the attending provider between 7A-7P or the covering provider during after hours 7P-7A, please log into the web site [email or web address] and access using universal [place] password for that web site. If you do not have the password, please call the hospital operator.  08/17/2021, 4:27 PM

## 2021-08-18 LAB — CBC WITH DIFFERENTIAL/PLATELET
Abs Immature Granulocytes: 0.01 10*3/uL (ref 0.00–0.07)
Basophils Absolute: 0 10*3/uL (ref 0.0–0.1)
Basophils Relative: 0 %
Eosinophils Absolute: 0.2 10*3/uL (ref 0.0–0.5)
Eosinophils Relative: 2 %
HCT: 39.1 % (ref 39.0–52.0)
Hemoglobin: 12.5 g/dL — ABNORMAL LOW (ref 13.0–17.0)
Immature Granulocytes: 0 %
Lymphocytes Relative: 21 %
Lymphs Abs: 1.3 10*3/uL (ref 0.7–4.0)
MCH: 29.9 pg (ref 26.0–34.0)
MCHC: 32 g/dL (ref 30.0–36.0)
MCV: 93.5 fL (ref 80.0–100.0)
Monocytes Absolute: 0.7 10*3/uL (ref 0.1–1.0)
Monocytes Relative: 12 %
Neutro Abs: 4.1 10*3/uL (ref 1.7–7.7)
Neutrophils Relative %: 65 %
Platelets: 241 10*3/uL (ref 150–400)
RBC: 4.18 MIL/uL — ABNORMAL LOW (ref 4.22–5.81)
RDW: 14.6 % (ref 11.5–15.5)
WBC: 6.4 10*3/uL (ref 4.0–10.5)
nRBC: 0 % (ref 0.0–0.2)

## 2021-08-18 LAB — MAGNESIUM: Magnesium: 2 mg/dL (ref 1.7–2.4)

## 2021-08-18 LAB — COMPREHENSIVE METABOLIC PANEL
ALT: 13 U/L (ref 0–44)
AST: 17 U/L (ref 15–41)
Albumin: 2.2 g/dL — ABNORMAL LOW (ref 3.5–5.0)
Alkaline Phosphatase: 51 U/L (ref 38–126)
Anion gap: 8 (ref 5–15)
BUN: 45 mg/dL — ABNORMAL HIGH (ref 6–20)
CO2: 29 mmol/L (ref 22–32)
Calcium: 9.5 mg/dL (ref 8.9–10.3)
Chloride: 102 mmol/L (ref 98–111)
Creatinine, Ser: 0.67 mg/dL (ref 0.61–1.24)
GFR, Estimated: >60 mL/min (ref >60)
Glucose, Bld: 125 mg/dL — ABNORMAL HIGH (ref 70–99)
Potassium: 4.4 mmol/L (ref 3.5–5.1)
Sodium: 139 mmol/L (ref 135–145)
Total Bilirubin: 0.5 mg/dL (ref 0.3–1.2)
Total Protein: 6.6 g/dL (ref 6.5–8.1)

## 2021-08-18 LAB — PHOSPHORUS: Phosphorus: 2.9 mg/dL (ref 2.5–4.6)

## 2021-08-18 NOTE — Progress Notes (Signed)
PROGRESS NOTE    Billy Cherry  GBE:[id number] DOB: June 12, 1970 DOA: 03/30/2021 PCP: Patient, No Pcp Per  No chief complaint on file.   Brief Narrative:  [name] 51 years old male with past medical history of hypertension who was brought into the hospital after being found unresponsive by his wife.  Patient was initially admitted to [facility] hospital for out of hospital cardiac arrest and ventricular fibrillation with respiratory arrest suspected from angioedema from ACE inhibitor leading to asphyxiation.  Patient was then subsequently transferred to [facility].  Below is the Sequence of events during hospitalization,  1/19 left heart cath without significant CAD.  Concern for anoxic brain injury 1/21: MRI shows areas of hypoxic-ischemic injury bilaterally. Fevered; cultures obtained. 1/23 severe brain damage 1/25: PEG . Neuro exam unchanged.  Does exhibit new eyelid tremor like movements and lip/jaw tremor like vs clonus movement.  Bolused Keppra and increased maintenance dose.  Repeat EEG. CTH: concerning for progression of anoxic injury.  1/26: Repeat EEG unchanged and continues to show myoclonic seizures despite addition of Valproic acid yesterday.  03/30/2021 Transfer to [facility] from [place] for continuous EEG 1/28: Continues to have myoclonic sz on EEG 2/3 no meaningful neuro recovery. Diuresed 2/6 New fever to 102, WBC 16K - treated with vancomycin for 7d based on cultures.  2/11: percutaneous tracheostomy 2/13 >2/20 on trach collar. No vent.  2/20 Trach change to 6 cuffless 2/28: fever 3/13: eliquis started 4/3: fever 06/06/2021 CT of the chest with new PE and haziness consistent with pneumonia 4/5-4/9: responding well to PRN lasix 4/11 fever again after completion of abx- work up in progress 4/13 continues to have low-grade temps cultures from 4/11 no growth to date-suspect silent/occult aspiration-heparin changed back to Eliquis 4/13 4/14: Work-up negative with  no growth to date but red menance Tmax elevated 99.4 and respiratory rate up-x-ray did not show         any pneumonia but probably silently aspirating 08/04/21- ongoing.  Patient remains unresponsive.  Awaiting for placement and disposition has been difficult     Assessment & Plan:   Principal Problem:   Cardiac arrest [facility]) Active Problems:   Anoxic encephalopathy ([name])   Acute respiratory failure ([name])   HAP (hospital-acquired pneumonia)   Fever   DVT (deep venous thrombosis) ([name])   Pulmonary embolus ([name])   Dysphagia   Emesis   HTN (hypertension)   Pressure injury of skin   HFrEF (heart failure with reduced ejection fraction) ([name])   Myoclonus   Obesity (BMI 30-39.9)   Assessment and Plan: * Cardiac arrest ([name]) Suspected in the setting of angioedema from ace leading to asphyxiation S/p trach/peg Complicated by anoxic encephalopathy as above Echocardiogram with reduced LV systolic function <20%, with global hypokinesis. RV systolic function is preserved. No significant valvular disease.  Continue with carvedilol, hydralazine, isosorbide and spironolactone.  Patient with very poor prognosis.   Anoxic encephalopathy ([name]) MRI brain significant for hypoxic brain injury. Continue supportive care. Patient currently keeps his eyes open, but does not answer questions or follow commands. Unchanged neurologic status based on prior notes - pupils fixed, dilated (noted in prior notes). Continue with clonazepam, keppra, and valproic acid.  Neuro checks per unit protocol, aspiration precautions.    Acute respiratory failure ([name]) Status post trach.  Continue supportive care Managing secretions well.  Stable on trach collar at 5 L (28% FiO2) Patient has Billy Cherry cuff less trach #6, continue care per protocol.   Fever Last fever  6/13 at 5 am (temp to 99.9 6/15) Blood cultures NGTDx3 resp culture pending collection CXR 6/11 concerning for possible multifocal pneumonia Follow on  cefepime (plan 7 days)    HAP (hospital-acquired pneumonia) Previously had completed vancomycin for MSSA and [name] in early march.  Received more than 7-day course of IV Fortaz. On 4/3, CT chest was ordered that indicated an area of alveolar infiltration that may represent pneumonia.  Tracheal aspirate has been sent for culture and is significant for normal respiratory flora. Concerning for aspiration. -s/p Unasyn x 7 days As above repeat fever on 6/13, CXR 6/11 concerning for pneumonia Continue on cefepime (plan 7 days) Follow blood cultures NGx3, sputum cx pending collection  Pulmonary embolus ([name]) Noted to have possible acute on chronic pulmonary embolism on CT.  Also have some evidence of possible pulmonary hypertension.   Currently patient is on apixaban with good toleration.   DVT (deep venous thrombosis) ([name]) Duplex ultrasound of lower extremity on 05/15/2021 showed age indeterminate deep vein thrombosis involving the left femoral vein, left proximal profunda vein, left posterior tibial veins, left popliteal vein, and left peroneal veins.  Currently continue anticoagulation with apixaban.    Emesis Resolved, monitor on tube feeds  Dysphagia Continue PEG tube feeding.  Tolerating but high risk of aspiration -Aspiration precautions  Pressure injury of skin Lateral penis, posterior right heel, right foot. Not present on admission. Pressure Injury 08/16/21 Foot Right;Upper;Posterior Stage 1 -  Intact skin with non-blanchable redness of Billy Cherry localized area usually over Billy Cherry bony prominence. (Active)  08/16/21 1629  Location: Foot  Location Orientation: Right;Upper;Posterior  Staging: Stage 1 -  Intact skin with non-blanchable redness of Billy Cherry localized area usually over Billy Cherry bony prominence.  Wound Description (Comments):   Present on Admission:      Pressure Injury 08/16/21 Heel Right Stage 1 -  Intact skin with non-blanchable redness of Billy Cherry localized area usually over  Billy Cherry bony prominence. (Active)  08/16/21 1632  Location: Heel  Location Orientation: Right  Staging: Stage 1 -  Intact skin with non-blanchable redness of Bryne Lindon localized area usually over Jaiyon Wander bony prominence.  Wound Description (Comments):   Present on Admission:       HTN (hypertension) Continue blood pressure control with amlodipine, carvedilol, hydralazine, isosorbide and spironolactone.   HFrEF (heart failure with reduced ejection fraction) ([name]) 2D echocardiogram-LVEF < 20%,  Patient euvolemic, continue medical therapy with carvedilol, isosorbide/ hydralazine and spironolactone As needed furosemide.   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  Continue current AED with Klonopin, Keppra, valproic acid. Per neuro notes, suppressing his myoclonus will not ultimately change the prognosis   Hypernatremia-resolved as of 08/07/2021 Resolved with free water flushes  Obesity (BMI 30-39.9) noted      DVT prophylaxis: eliquis Code Status: full Family Communication: none Disposition:   Status is: Inpatient Remains inpatient appropriate because: continued need for abx, placement   Consultants:  ID Palliative Neuro Cards PCCM  Procedures:  Trach peg  Antimicrobials:  Anti-infectives (From admission, onward)    Start     Dose/Rate Route Frequency Ordered Stop   08/13/21 2000  ceFEPIme (MAXIPIME) 2 g in sodium chloride 0.9 % 100 mL IVPB        2 g 200 mL/hr over 30 Minutes Intravenous Every 8 hours 08/13/21 1855     06/07/21 1500  Ampicillin-Sulbactam (UNASYN) 3 g in sodium chloride 0.9 % 100 mL IVPB        3 g 200 mL/hr over 30 Minutes Intravenous Every  6 hours 06/07/21 1320 06/12/21 1032   06/06/21 0745  ceFEPIme (MAXIPIME) 2 g in sodium chloride 0.9 % 100 mL IVPB  Status:  Discontinued        2 g 200 mL/hr over 30 Minutes Intravenous Every 8 hours 06/06/21 0654 06/07/21 1312   05/09/21 0800  vancomycin (VANCOREADY) IVPB 1250 mg/250 mL  Status:  Discontinued        1,250 mg 166.7  mL/hr over 90 Minutes Intravenous Every 12 hours 05/08/21 1621 05/09/21 0954   05/08/21 1715  vancomycin (VANCOREADY) IVPB 2000 mg/400 mL        2,000 mg 200 mL/hr over 120 Minutes Intravenous  Once 05/08/21 1618 05/08/21 1943   05/02/21 1000  cefTAZidime (FORTAZ) 2 g in sodium chloride 0.9 % 100 mL IVPB  Status:  Discontinued        2 g 200 mL/hr over 30 Minutes Intravenous Every 8 hours 05/02/21 0846 05/10/21 0936   04/26/21 0756  ceFAZolin (ANCEF) 2-4 GM/100ML-% IVPB  Status:  Discontinued       Note to Pharmacy: Billy Cherry L: cabinet override      04/26/21 0756 04/26/21 0855   04/26/21 0600  ceFAZolin (ANCEF) IVPB 2g/100 mL premix        2 g 200 mL/hr over 30 Minutes Intravenous Every 8 hours 04/25/21 1131 04/26/21 0915   04/23/21 1400  cefTAZidime (FORTAZ) 1 g in sodium chloride 0.9 % 100 mL IVPB        1 g 200 mL/hr over 30 Minutes Intravenous Every 8 hours 04/23/21 1256 04/25/21 2331   04/20/21 1300  ceFEPIme (MAXIPIME) 2 g in sodium chloride 0.9 % 100 mL IVPB  Status:  Discontinued        2 g 200 mL/hr over 30 Minutes Intravenous Every 8 hours 04/20/21 1205 04/23/21 1241   04/14/21 0930  vancomycin (VANCOREADY) IVPB 1250 mg/250 mL        1,250 mg 166.7 mL/hr over 90 Minutes Intravenous Every 24 hours 04/13/21 0902 04/19/21 1247   04/13/21 0930  vancomycin (VANCOREADY) IVPB 2000 mg/400 mL        2,000 mg 200 mL/hr over 120 Minutes Intravenous  Once 04/13/21 0831 04/13/21 1158   04/12/21 0815  ceFEPIme (MAXIPIME) 2 g in sodium chloride 0.9 % 100 mL IVPB  Status:  Discontinued        2 g 200 mL/hr over 30 Minutes Intravenous Every 8 hours 04/12/21 0724 04/13/21 0829   04/06/21 1800  ceFAZolin (ANCEF) IVPB 2g/100 mL premix        2 g 200 mL/hr over 30 Minutes Intravenous Every 8 hours 04/06/21 1403 04/10/21 1913   04/04/21 1000  ceFEPIme (MAXIPIME) 2 g in sodium chloride 0.9 % 100 mL IVPB  Status:  Discontinued        2 g 200 mL/hr over 30 Minutes Intravenous Every 8 hours  04/04/21 0919 04/06/21 1403       Subjective: nonverbal   Objective: Vitals:   08/18/21 0851 08/18/21 1159 08/18/21 1351 08/18/21 1552  BP: 123/89 (!) 117/97 (!) 137/100 (!) 137/100  Pulse: 97 (!) 103  90  Resp: 18 18  18   Temp:      TempSrc:      SpO2: 98% 99%  99%  Weight:      Height:        Intake/Output Summary (Last 24 hours) at 08/18/2021 1652 Last data filed at 08/18/2021 0500 Gross per 24 hour  Intake 100 ml  Output  600 ml  Net -500 ml   Filed Weights   08/16/21 0353 08/17/21 0440 08/18/21 0549  Weight: 97 kg 97 kg 99 kg    Examination:  General: No acute distress. Cardiovascular: RRR Lungs: unlabored, trach Abdomen: Soft, nontender, nondistended - peg Neurological: eyes open, staring ahead, doesn't follow commands or make any meaningful movement Extremities: No clubbing or cyanosis. No edema.   Data Reviewed: I have personally reviewed following labs and imaging studies  CBC: Recent Labs  Lab 08/13/21 1637 08/16/21 0617 08/17/21 1205  WBC 9.8 9.8 7.0  NEUTROABS  --   --  4.5  HGB 13.8 12.1* 11.8*  HCT 41.5 36.6* 36.5*  MCV 91.6 91.7 92.4  PLT 272 217 [id number]    Basic Metabolic Panel: Recent Labs  Lab 08/13/21 1637 08/16/21 0617 08/17/21 1205  NA 133* 137 140  K 4.7 4.6 4.3  CL 96* 101 103  CO2 27 28 30   GLUCOSE 127* 88 94  BUN 40* 42* 45*  CREATININE 0.63 0.72 0.76  CALCIUM 9.6 9.7 9.5  MG  --   --  2.0  PHOS  --   --  3.5    GFR: Estimated Creatinine Clearance: 130.3 mL/min (by C-G formula based on SCr of 0.76 mg/dL).  Liver Function Tests: Recent Labs  Lab 08/13/21 1637 08/17/21 1205  AST 18 18  ALT 15 14  ALKPHOS 61 43  BILITOT 0.3 0.5  PROT 7.1 6.5  ALBUMIN 2.6* 2.2*    CBG: No results for input(s): "GLUCAP" in the last 168 hours.   Recent Results (from the past 240 hour(s))  Culture, blood (Routine X 2) w Reflex to ID Panel     Status: None (Preliminary result)   Collection Time: 08/15/21  3:11 PM   Specimen:  BLOOD LEFT HAND  Result Value Ref Range Status   Specimen Description BLOOD LEFT HAND  Final   Special Requests   Final    BOTTLES DRAWN AEROBIC AND ANAEROBIC Blood Culture adequate volume   Culture   Final    NO GROWTH 3 DAYS Performed at [facility], [place] [address] [place], [place] [address]    Report Status PENDING  Incomplete  Culture, blood (Routine X 2) w Reflex to ID Panel     Status: None (Preliminary result)   Collection Time: 08/15/21  3:12 PM   Specimen: BLOOD  Result Value Ref Range Status   Specimen Description BLOOD RIGHT ANTECUBITAL  Final   Special Requests   Final    BOTTLES DRAWN AEROBIC AND ANAEROBIC Blood Culture adequate volume   Culture   Final    NO GROWTH 3 DAYS Performed at [facility], [place] [address], [place], [place] [address]    Report Status PENDING  Incomplete         Radiology Studies: No results found.      Scheduled Meds:  apixaban  5 mg Per Tube BID   atropine  2 drop Sublingual QID   carvedilol  25 mg Per Tube BID   chlorhexidine  15 mL Mouth/Throat BID   clonazepam  0.5 mg Per Tube TID   doxazosin  2 mg Per Tube Daily   feeding supplement (OSMOLITE 1.5 CAL)  355 mL Per Tube QID   feeding supplement (PROSource TF)  45 mL Per Tube TID   fiber  1 packet Per Tube BID   free water  200 mL Per Tube Q6H   Gerhardt's butt cream   Topical  Daily   glycopyrrolate  1 mg Per Tube TID   hydrALAZINE  100 mg Per Tube Q8H   isosorbide dinitrate  30 mg Per Tube TID   levETIRAcetam  750 mg Per Tube BID   mouth rinse  15 mL Mouth Rinse q12n4p   nutrition supplement (JUVEN)  1 packet Per Tube BID BM   pantoprazole sodium  40 mg Per Tube QHS   scopolamine  1 patch Transdermal Q72H   spironolactone  25 mg Per Tube Daily   valproic acid  750 mg Per Tube TID   Continuous Infusions:  ceFEPime (MAXIPIME) IV 2 g (08/18/21 1350)     LOS: 141 days    Time spent: over 30 min    Fayrene Helper, MD [facility]   To  contact the attending provider between 7A-7P or the covering provider during after hours 7P-7A, please log into the web site [email or web address] and access using universal [place] password for that web site. If you do not have the password, please call the hospital operator.  08/18/2021, 4:52 PM

## 2021-08-18 NOTE — Progress Notes (Signed)
Patient seen today by trach team for consult.  No education is needed at this time.  All necessary equipment is at beside.   Will continue to follow for progression.  

## 2021-08-18 NOTE — TOC Progression Note (Signed)
Transition of Care [facility]) - Progression Note    Patient Details  Name: Billy Cherry MRN: [id number] Date of Birth: Mar 18, 1970  Transition of Care [facility]) CM/SW Contact  Janae Bridgeman, RN Phone Number: 08/18/2021, 11:47 AM  Clinical Narrative:    CM spoke with Billy Cherry, Admission director with [facility] facility and they may have bed availability on Monday, 08/21/2021 and will contact me regarding possible bed opening.  I called and spoke with the wife and updated her about the likely bed offer at the facility - for semi private availability.  The patient's wife states that the patient's mother is visiting this weekend and they plan to visit [facility] and [facility] in [place], [place].  CM and MSW with DTP Team will continue to follow the patient for discharge planning.   Expected Discharge Plan: Home w Home Health Services Barriers to Discharge: Other (must enter comment) (Patient's wife - requested private duty services through alternative provider for 12 hours shifts for staffing at the home.)  Expected Discharge Plan and Services Expected Discharge Plan: Home w Home Health Services In-house Referral: Clinical Social Work Discharge Planning Services: CM Consult, Follow-up appt scheduled Post Acute Care Choice: Home Health Living arrangements for the past 2 months: Single Family Home Expected Discharge Date: 08/07/21               DME Arranged: Janina Mayo supplies, Tube feeding, Suction, Hospital bed, Oxygen DME Agency: [employer]       HH Arranged: RN (Private Duty Nursing through [employer] versus [place]) HH Agency: [employer] Services Date HH Agency Contacted: 07/04/21 Time HH Agency Contacted: 1000 Representative spoke with at [facility] Agency: Jeanmarie Plant, Director with [employer] [phone]   Social Determinants of Health (SDOH) Interventions    Readmission Risk Interventions    04/14/2021   11:14 AM   Readmission Risk Prevention Plan  Transportation Screening Complete  HRI or Home Care Consult Complete  Social Work Consult for Recovery Care Planning/Counseling Complete  Palliative Care Screening Complete  Medication Review [employer]) Complete

## 2021-08-19 ENCOUNTER — Inpatient Hospital Stay (HOSPITAL_COMMUNITY): Payer: BC Managed Care – PPO

## 2021-08-19 NOTE — Progress Notes (Signed)
PROGRESS NOTE    Billy [facility]  [id number] DOB: 1970/09/18 DOA: 03/30/2021 PCP: Patient, No Pcp Per  No chief complaint on file.   Brief Narrative:  [name] 51 years old male with past medical history of hypertension who was brought into the [facility] after being found unresponsive by his wife.  Patient was initially admitted to [facility] for out of [facility] cardiac arrest and ventricular fibrillation with respiratory arrest suspected from angioedema from ACE inhibitor leading to asphyxiation.  Patient was then subsequently transferred to [facility].  Below is the Sequence of events during hospitalization,  1/19 left heart cath without significant CAD.  Concern for anoxic brain injury 1/21: MRI shows areas of hypoxic-ischemic injury bilaterally. Fevered; cultures obtained. 1/23 severe brain damage 1/25: PEG . Neuro exam unchanged.  Does exhibit new eyelid tremor like movements and lip/jaw tremor like vs clonus movement.  Bolused Keppra and increased maintenance dose.  Repeat EEG. CTH: concerning for progression of anoxic injury.  1/26: Repeat EEG unchanged and continues to show myoclonic seizures despite addition of Valproic acid yesterday.  03/30/2021 Transfer to [facility] from [facility] for continuous EEG 1/28: Continues to have myoclonic sz on EEG 2/3 no meaningful neuro recovery. Diuresed 2/6 New fever to 102, WBC 16K - treated with vancomycin for 7d based on cultures.  2/11: percutaneous tracheostomy 2/13 >2/20 on trach collar. No vent.  2/20 Trach change to 6 cuffless 2/28: fever 3/13: [facility] started 4/3: fever 06/06/2021 CT of the chest with new PE and haziness consistent with pneumonia 4/5-4/9: responding well to PRN lasix 4/11 fever again after completion of abx- work up in progress 4/13 continues to have low-grade temps cultures from 4/11 no growth to date-suspect silent/occult aspiration-heparin changed back to [facility] 4/13 4/14: Work-up negative with  no growth to date but red menance Tmax elevated 99.4 and respiratory rate up-x-ray did not show         any pneumonia but probably silently aspirating 08/04/21- ongoing.  Patient remains unresponsive.  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[facility])   Acute respiratory failure ([facility])   HAP (Cherry-acquired pneumonia)   Fever   DVT (deep venous thrombosis) ([facility])   Pulmonary embolus ([facility])   Dysphagia   Emesis   HTN (hypertension)   Pressure injury of skin   [facility] (heart failure with reduced ejection fraction) ([facility])   Myoclonus   Obesity (BMI 30-39.9)   Assessment and Plan: * Cardiac arrest ([facility]) Suspected in the setting of angioedema from ace leading to asphyxiation S/p trach/peg Complicated by anoxic encephalopathy as above Echocardiogram with reduced LV systolic function [id number], with global hypokinesis. RV systolic function is preserved. No significant valvular disease.  Continue with carvedilol, hydralazine, isosorbide and spironolactone.  Patient with very poor prognosis.   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 Continue supportive care. Patient currently keeps his eyes open, but does not answer questions or follow commands. Unchanged neurologic status based on prior notes - pupils fixed, dilated (noted in prior notes). Continue with clonazepam, keppra, and valproic acid.  Neuro checks per unit protocol, aspiration precautions.    Acute respiratory failure ([facility]) Status post trach.  Continue supportive care Managing secretions well.  Stable on trach collar at 5 L (28% FiO2) sats low today, improved after adjustment by RN -> CXR without acute cardiopulm dz Patient has  [facility] cuff less trach #6, continue care per protocol.   Fever Last fever 6/13 at 5 am (temp to 99.9 6/15) Blood cultures NGTDx3 resp culture pending  collection CXR 6/11 concerning for possible multifocal pneumonia Follow on cefepime (plan 7 days)    HAP ([facility]-acquired pneumonia) Previously had completed vancomycin for MSSA and [facility] in early march.  Received more than 7-day course of IV [facility]. On 4/3, CT chest was ordered that indicated an area of alveolar infiltration that may represent pneumonia.  Tracheal aspirate has been sent for culture and is significant for normal respiratory flora. Concerning for aspiration. -s/p Unasyn x 7 days As above repeat fever on 6/13, CXR 6/11 concerning for pneumonia (CXR 6/17 without evidence cardiopulm dz) Continue on cefepime (plan 7 days) Follow blood cultures NGx3, sputum cx not collected  Pulmonary embolus ([facility]) Noted to have possible acute on chronic pulmonary embolism on CT.  Also have some evidence of possible pulmonary hypertension.   Currently patient is on apixaban with good toleration.   DVT (deep venous thrombosis) ([facility]) Duplex ultrasound of lower extremity on 05/15/2021 showed age indeterminate deep vein thrombosis involving the left femoral vein, left proximal profunda vein, left posterior tibial veins, left popliteal vein, and left peroneal veins.  Currently continue anticoagulation with apixaban.    Emesis Resolved, monitor on tube feeds  Dysphagia Continue PEG tube feeding.  Tolerating but high risk of aspiration -Aspiration precautions  Pressure injury of skin Lateral penis, posterior right heel, right foot. Not present on admission. Pressure Injury 08/16/21 Foot Right;Upper;Posterior Stage 1 -  Intact skin with non-blanchable redness of [facility] localized area usually over [facility] bony prominence. (Active)  08/16/21 1629  Location: Foot  Location Orientation: Right;Upper;Posterior  Staging: Stage 1 -  Intact skin with non-blanchable redness of [facility] localized area usually over [facility] bony prominence.  Wound Description (Comments):   Present on Admission:       Pressure Injury 08/16/21 Heel Right Stage 1 -  Intact skin with non-blanchable redness of [facility] localized area usually over Kahliya Fraleigh bony prominence. (Active)  08/16/21 1632  Location: Heel  Location Orientation: Right  Staging: Stage 1 -  Intact skin with non-blanchable redness of Chalmer Zheng localized area usually over Zonie Crutcher bony prominence.  Wound Description (Comments):   Present on Admission:       HTN (hypertension) Continue blood pressure control with amlodipine, carvedilol, hydralazine, isosorbide and spironolactone.   [facility] (heart failure with reduced ejection fraction) ([facility]) 2D echocardiogram-LVEF < 20%,  Patient euvolemic, continue medical therapy with carvedilol, isosorbide/ hydralazine and spironolactone As needed furosemide.   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  Continue current AED with Klonopin, Keppra, valproic acid. Per neuro notes, suppressing his myoclonus will not ultimately change the prognosis   Hypernatremia-resolved as of 08/07/2021 Resolved with free water flushes  Obesity (BMI 30-39.9) noted      DVT prophylaxis: [facility] Code Status: full Family Communication: none Disposition:   Status is: Inpatient Remains inpatient appropriate because: continued need for abx, placement   Consultants:  ID Palliative Neuro Cards PCCM  Procedures:  Trach peg  Antimicrobials:  Anti-infectives (From admission, onward)    Start     Dose/Rate Route Frequency Ordered Stop   08/13/21 2000  ceFEPIme (MAXIPIME) 2 g in sodium chloride 0.9 % 100 mL IVPB        2 g 200 mL/hr over 30 Minutes Intravenous Every 8 hours 08/13/21 1855     06/07/21 1500  Ampicillin-Sulbactam (UNASYN) 3 g in sodium chloride 0.9 %  100 mL IVPB        3 g 200 mL/hr over 30 Minutes Intravenous Every 6 hours 06/07/21 1320 06/12/21 1032   06/06/21 0745  ceFEPIme (MAXIPIME) 2 g in sodium chloride 0.9 % 100 mL IVPB  Status:  Discontinued        2 g 200 mL/hr over 30 Minutes Intravenous Every 8 hours 06/06/21 0654  06/07/21 1312   05/09/21 0800  vancomycin (VANCOREADY) IVPB 1250 mg/250 mL  Status:  Discontinued        1,250 mg 166.7 mL/hr over 90 Minutes Intravenous Every 12 hours 05/08/21 1621 05/09/21 0954   05/08/21 1715  vancomycin (VANCOREADY) IVPB 2000 mg/400 mL        2,000 mg 200 mL/hr over 120 Minutes Intravenous  Once 05/08/21 1618 05/08/21 1943   05/02/21 1000  cefTAZidime ([facility]) 2 g in sodium chloride 0.9 % 100 mL IVPB  Status:  Discontinued        2 g 200 mL/hr over 30 Minutes Intravenous Every 8 hours 05/02/21 0846 05/10/21 0936   04/26/21 0756  ceFAZolin (ANCEF) 2-4 GM/100ML-% IVPB  Status:  Discontinued       Note to Pharmacy: [name] L: cabinet override      04/26/21 0756 04/26/21 0855   04/26/21 0600  ceFAZolin (ANCEF) IVPB 2g/100 mL premix        2 g 200 mL/hr over 30 Minutes Intravenous Every 8 hours 04/25/21 1131 04/26/21 0915   04/23/21 1400  cefTAZidime ([facility]) 1 g in sodium chloride 0.9 % 100 mL IVPB        1 g 200 mL/hr over 30 Minutes Intravenous Every 8 hours 04/23/21 1256 04/25/21 2331   04/20/21 1300  ceFEPIme (MAXIPIME) 2 g in sodium chloride 0.9 % 100 mL IVPB  Status:  Discontinued        2 g 200 mL/hr over 30 Minutes Intravenous Every 8 hours 04/20/21 1205 04/23/21 1241   04/14/21 0930  vancomycin (VANCOREADY) IVPB 1250 mg/250 mL        1,250 mg 166.7 mL/hr over 90 Minutes Intravenous Every 24 hours 04/13/21 0902 04/19/21 1247   04/13/21 0930  vancomycin (VANCOREADY) IVPB 2000 mg/400 mL        2,000 mg 200 mL/hr over 120 Minutes Intravenous  Once 04/13/21 0831 04/13/21 1158   04/12/21 0815  ceFEPIme (MAXIPIME) 2 g in sodium chloride 0.9 % 100 mL IVPB  Status:  Discontinued        2 g 200 mL/hr over 30 Minutes Intravenous Every 8 hours 04/12/21 0724 04/13/21 0829   04/06/21 1800  ceFAZolin (ANCEF) IVPB 2g/100 mL premix        2 g 200 mL/hr over 30 Minutes Intravenous Every 8 hours 04/06/21 1403 04/10/21 1913   04/04/21 1000  ceFEPIme (MAXIPIME) 2 g in  sodium chloride 0.9 % 100 mL IVPB  Status:  Discontinued        2 g 200 mL/hr over 30 Minutes Intravenous Every 8 hours 04/04/21 0919 04/06/21 1403       Subjective: nonverbal   Objective: Vitals:   08/19/21 0738 08/19/21 0745 08/19/21 1127 08/19/21 1200  BP:  (!) 147/101 (!) 141/104 (!) 122/110  Pulse: 88 92 90 88  Resp: 20 19 18    Temp:  98.1 F (36.7 C)    TempSrc:  Oral    SpO2: 97% 96% 93% 93%  Weight:      Height:        Intake/Output Summary (Last  24 hours) at 08/19/2021 1513 Last data filed at 08/19/2021 1417 Gross per 24 hour  Intake 710 ml  Output 1850 ml  Net -1140 ml   Filed Weights   08/17/21 0440 08/18/21 0549 08/19/21 0319  Weight: 97 kg 99 kg 100 kg    Examination:  General: No acute distress. Cardiovascular: RRR Lungs: sats high 80's, improves with adjustment by RN - trach collar Abdomen: Soft, nontender, nondistended - peg Neurological: opens eyes, but no meaningful movement, not following commands Extremities: No clubbing or cyanosis. No edema.  Data Reviewed: I have personally reviewed following labs and imaging studies  CBC: Recent Labs  Lab 08/13/21 1637 08/16/21 0617 08/17/21 1205 08/18/21 1657  WBC 9.8 9.8 7.0 6.4  NEUTROABS  --   --  4.5 4.1  HGB 13.8 12.1* 11.8* 12.5*  HCT 41.5 36.6* 36.5* 39.1  MCV 91.6 91.7 92.4 93.5  PLT 272 217 242 241    Basic Metabolic Panel: Recent Labs  Lab 08/13/21 1637 08/16/21 0617 08/17/21 1205 08/18/21 1657  NA 133* 137 140 139  K 4.7 4.6 4.3 4.4  CL 96* 101 103 102  CO2 27 28 30 29   GLUCOSE 127* 88 94 125*  BUN 40* 42* 45* 45*  CREATININE 0.63 0.72 0.76 0.67  CALCIUM 9.6 9.7 9.5 9.5  MG  --   --  2.0 2.0  PHOS  --   --  3.5 2.9    GFR: Estimated Creatinine Clearance: 130.9 mL/min (by C-G formula based on SCr of 0.67 mg/dL).  Liver Function Tests: Recent Labs  Lab 08/13/21 1637 08/17/21 1205 08/18/21 1657  AST 18 18 17   ALT 15 14 13   ALKPHOS 61 43 51  BILITOT 0.3 0.5 0.5   PROT 7.1 6.5 6.6  ALBUMIN 2.6* 2.2* 2.2*    CBG: No results for input(s): "GLUCAP" in the last 168 hours.   Recent Results (from the past 240 hour(s))  Culture, blood (Routine X 2) w Reflex to ID Panel     Status: None (Preliminary result)   Collection Time: 08/15/21  3:11 PM   Specimen: BLOOD LEFT HAND  Result Value Ref Range Status   Specimen Description BLOOD LEFT HAND  Final   Special Requests   Final    BOTTLES DRAWN AEROBIC AND ANAEROBIC Blood Culture adequate volume   Culture   Final    NO GROWTH 4 DAYS Performed at [facility], [address] [place], [place] [facility] [address]    Report Status PENDING  Incomplete  Culture, blood (Routine X 2) w Reflex to ID Panel     Status: None (Preliminary result)   Collection Time: 08/15/21  3:12 PM   Specimen: BLOOD  Result Value Ref Range Status   Specimen Description BLOOD RIGHT ANTECUBITAL  Final   Special Requests   Final    BOTTLES DRAWN AEROBIC AND ANAEROBIC Blood Culture adequate volume   Culture   Final    NO GROWTH 4 DAYS Performed at [facility], [address], [place], [place] [facility] [address]    Report Status PENDING  Incomplete         Radiology Studies: DG CHEST PORT 1 VIEW  Result Date: 08/19/2021 CLINICAL DATA:  Acute on chronic respiratory failure with hypoxia. EXAM: PORTABLE CHEST 1 VIEW COMPARISON:  08/13/2021 and prior radiographs FINDINGS: Cardiomegaly and low volume study again noted. Tracheostomy tube is again identified. Mild peribronchial thickening is unchanged. There is no evidence of focal airspace disease, pulmonary edema, suspicious  pulmonary nodule/mass, pleural effusion, or pneumothorax. No acute bony abnormalities are identified. IMPRESSION: Cardiomegaly without evidence of acute cardiopulmonary disease. Electronically Signed   By: Margarette Canada M.D.   On: 08/19/2021 12:23        Scheduled Meds:  apixaban  5 mg Per Tube BID   atropine  2 drop Sublingual QID   carvedilol  25 mg Per Tube  BID   chlorhexidine  15 mL Mouth/Throat BID   clonazepam  0.5 mg Per Tube TID   doxazosin  2 mg Per Tube Daily   feeding supplement (OSMOLITE 1.5 CAL)  355 mL Per Tube QID   feeding supplement (PROSource TF)  45 mL Per Tube TID   fiber  1 packet Per Tube BID   free water  200 mL Per Tube Q6H   Gerhardt's butt cream   Topical Daily   glycopyrrolate  1 mg Per Tube TID   hydrALAZINE  100 mg Per Tube Q8H   isosorbide dinitrate  30 mg Per Tube TID   levETIRAcetam  750 mg Per Tube BID   mouth rinse  15 mL Mouth Rinse q12n4p   nutrition supplement (JUVEN)  1 packet Per Tube BID BM   pantoprazole sodium  40 mg Per Tube QHS   scopolamine  1 patch Transdermal Q72H   spironolactone  25 mg Per Tube Daily   valproic acid  750 mg Per Tube TID   Continuous Infusions:  ceFEPime (MAXIPIME) IV 2 g (08/19/21 1110)     LOS: 142 days    Time spent: over 30 min    Fayrene Helper, MD [facility]   To contact the attending provider between 7A-7P or the covering provider during after hours 7P-7A, please log into the web site [email or web address] and access using universal [place] password for that web site. If you do not have the password, please call the [facility] operator.  08/19/2021, 3:13 PM

## 2021-08-19 NOTE — Progress Notes (Signed)
Pharmacy Antibiotic Note- follow-up  Billy [facility] is a 51 y.o. male transferred to [facility] on 03/30/2021 with seizure requiring EEG monitoring.  Throughout the extended LOS, patient has completed several courses of antibiotics for PNA.  Pharmacy has been consulted for cefepime dosing for possible multifocal PNA on CXR.  He has a trach. Plan per notes is to treat for 7 days.  Renal function stable, afebrile, WBC WNL.  Plan: Continue Cefepime 2gm IV Q8H Monitor renal fxn, micro data  Height:  (bed scale not working) Weight: 100 kg (220 lb 7.4 oz) IBW/kg (Calculated) : 73  Temp (24hrs), Avg:98.6 F (37 C), Min:98.1 F (36.7 C), Max:98.9 F (37.2 C)  Recent Labs  Lab 08/13/21 1637 08/16/21 0617 08/17/21 1205 08/18/21 1657  WBC 9.8 9.8 7.0 6.4  CREATININE 0.63 0.72 0.76 0.67     Estimated Creatinine Clearance: 130.9 mL/min (by C-G formula based on SCr of 0.67 mg/dL).    Allergies  Allergen Reactions   Lisinopril Swelling    angioedema   Cefepime 4/4>>4/5, 6/11 >> Unasyn 4/5 >> 4/10   3/6: MRSE bacteremia (contaminant) 4/15 BCx x1: negative 4/11 BCx: negative 4/11 UA negative 4/4 bld: negative 6/13 Bcx- in process  Thank you for allowing pharmacy to participate in this patient's care.  Enos Fling, PharmD PGY1 Pharmacy Resident 08/19/2021 9:19 AM Check [email or web address] for unit specific pharmacy number

## 2021-08-20 LAB — CULTURE, BLOOD (ROUTINE X 2)
Culture: NO GROWTH
Culture: NO GROWTH
Special Requests: ADEQUATE
Special Requests: ADEQUATE

## 2021-08-20 NOTE — Progress Notes (Signed)
PROGRESS NOTE    Billy Cherry  HQI:[id number] DOB: [birth year] DOA: 03/30/2021 PCP: Patient, No Pcp Per  No chief complaint on file.   Brief Narrative:  [name] 51 years old male with past medical history of hypertension who was brought into the hospital after being found unresponsive by his wife.  Patient was initially admitted to [facility] hospital for out of hospital cardiac arrest and ventricular fibrillation with respiratory arrest suspected from angioedema from ACE inhibitor leading to asphyxiation.  Patient was then subsequently transferred to [facility].  Below is the Sequence of events during hospitalization,  1/19 left heart cath without significant CAD.  Concern for anoxic brain injury 1/21: MRI shows areas of hypoxic-ischemic injury bilaterally. Fevered; cultures obtained. 1/23 severe brain damage 1/25: PEG . Neuro exam unchanged.  Does exhibit new eyelid tremor like movements and lip/jaw tremor like vs clonus movement.  Bolused Keppra and increased maintenance dose.  Repeat EEG. CTH: concerning for progression of anoxic injury.  1/26: Repeat EEG unchanged and continues to show myoclonic seizures despite addition of Valproic acid yesterday.  03/30/2021 Transfer to [facility] from [facility] for continuous EEG 1/28: Continues to have myoclonic sz on EEG 2/3 no meaningful neuro recovery. Diuresed 2/6 New fever to 102, WBC 16K - treated with vancomycin for 7d based on cultures.  2/11: percutaneous tracheostomy 2/13 >2/20 on trach collar. No vent.  2/20 Trach change to 6 cuffless 2/28: fever 3/13: eliquis started 4/3: fever 06/06/2021 CT of the chest with new PE and haziness consistent with pneumonia 4/5-4/9: responding well to PRN lasix 4/11 fever again after completion of abx- work up in progress 4/13 continues to have low-grade temps cultures from 4/11 no growth to date-suspect silent/occult aspiration-heparin changed back to Eliquis 4/13 4/14: Work-up negative with  no growth to date but red menance Tmax elevated 99.4 and respiratory rate up-x-ray did not show         any pneumonia but probably silently aspirating 08/04/21- ongoing.  Patient remains unresponsive.  Awaiting for placement and disposition has been difficult     Assessment & Plan:   Principal Problem:   Cardiac arrest [facility]) Active Problems:   Anoxic encephalopathy ([name])   Acute respiratory failure ([name])   HAP (hospital-acquired pneumonia)   Fever   DVT (deep venous thrombosis) ([name])   Pulmonary embolus ([name])   Dysphagia   Emesis   HTN (hypertension)   Pressure injury of skin   HFrEF (heart failure with reduced ejection fraction) ([name])   Myoclonus   Obesity (BMI 30-39.9)   Assessment and Plan: * Cardiac arrest ([name]) Suspected in the setting of angioedema from ace leading to asphyxiation S/p trach/peg Complicated by anoxic encephalopathy as above Echocardiogram with reduced LV systolic function <20%, with global hypokinesis. RV systolic function is preserved. No significant valvular disease.  Continue with carvedilol, hydralazine, isosorbide and spironolactone.  Patient with very poor prognosis.   Anoxic encephalopathy ([name]) MRI brain significant for hypoxic brain injury. Continue supportive care. Patient currently keeps his eyes open, but does not answer questions or follow commands. Unchanged neurologic status based on prior notes - pupils fixed, dilated (noted in prior notes). Continue with clonazepam, keppra, and valproic acid.  Neuro checks per unit protocol, aspiration precautions.    Acute respiratory failure ([name]) Status post trach.  Continue supportive care Managing secretions well.  Stable on trach collar at 5 L (28% FiO2) CXR 6/17 without acute cardiopulm dz Patient has Mindy Behnken cuff less trach #6, continue care per  protocol.   Fever Last fever 6/13 at 5 am (temp to 99.9 6/15) Blood cultures NGTDx3 resp culture pending collection Completed 7 days abx    HAP  (hospital-acquired pneumonia) Previously had completed vancomycin for MSSA and [name] in early march.  Received more than 7-day course of IV Fortaz. On 4/3, CT chest was ordered that indicated an area of alveolar infiltration that may represent pneumonia.  Tracheal aspirate has been sent for culture and is significant for normal respiratory flora. Concerning for aspiration. -s/p Unasyn x 7 days As above repeat fever on 6/13, CXR 6/11 concerning for pneumonia (CXR 6/17 without evidence cardiopulm dz) Continue on cefepime (plan 7 days) Follow blood cultures NGx3, sputum cx not collected  Pulmonary embolus ([name]) Noted to have possible acute on chronic pulmonary embolism on CT.  Also have some evidence of possible pulmonary hypertension.   Currently patient is on apixaban with good toleration.   DVT (deep venous thrombosis) ([name]) Duplex ultrasound of lower extremity on 05/15/2021 showed age indeterminate deep vein thrombosis involving the left femoral vein, left proximal profunda vein, left posterior tibial veins, left popliteal vein, and left peroneal veins.  Currently continue anticoagulation with apixaban.    Emesis Resolved, monitor on tube feeds  Dysphagia Continue PEG tube feeding.  Tolerating but high risk of aspiration -Aspiration precautions  Pressure injury of skin Lateral penis, posterior right heel, right foot. Not present on admission. Pressure Injury 08/16/21 Foot Right;Upper;Posterior Stage 1 -  Intact skin with non-blanchable redness of Ilona Colley localized area usually over Tamera Pingley bony prominence. (Active)  08/16/21 1629  Location: Foot  Location Orientation: Right;Upper;Posterior  Staging: Stage 1 -  Intact skin with non-blanchable redness of Rivkah Wolz localized area usually over Kimanh Templeman bony prominence.  Wound Description (Comments):   Present on Admission:      Pressure Injury 08/16/21 Heel Right Stage 1 -  Intact skin with non-blanchable redness of Daysha Ashmore localized area usually  over Cayla Wiegand bony prominence. (Active)  08/16/21 1632  Location: Heel  Location Orientation: Right  Staging: Stage 1 -  Intact skin with non-blanchable redness of Kenise Barraco localized area usually over Marri Mcneff bony prominence.  Wound Description (Comments):   Present on Admission:       HTN (hypertension) Continue blood pressure control with amlodipine, carvedilol, hydralazine, isosorbide and spironolactone.   HFrEF (heart failure with reduced ejection fraction) ([place]) 2D echocardiogram-LVEF < 20%,  Patient euvolemic, continue medical therapy with carvedilol, isosorbide/ hydralazine and spironolactone As needed furosemide.   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  Continue current AED with Klonopin, Keppra, valproic acid. Per neuro notes, suppressing his myoclonus will not ultimately change the prognosis   Hypernatremia-resolved as of 08/07/2021 Resolved with free water flushes  Obesity (BMI 30-39.9) noted      DVT prophylaxis: eliquis Code Status: full Family Communication: none Disposition:   Status is: Inpatient Remains inpatient appropriate because: continued need for abx, placement   Consultants:  ID Palliative Neuro Cards PCCM  Procedures:  Trach peg  Antimicrobials:  Anti-infectives (From admission, onward)    Start     Dose/Rate Route Frequency Ordered Stop   08/13/21 2000  ceFEPIme (MAXIPIME) 2 g in sodium chloride 0.9 % 100 mL IVPB        2 g 200 mL/hr over 30 Minutes Intravenous Every 8 hours 08/13/21 1855 08/19/21 2130   06/07/21 1500  Ampicillin-Sulbactam (UNASYN) 3 g in sodium chloride 0.9 % 100 mL IVPB        3 g 200 mL/hr over 30  Minutes Intravenous Every 6 hours 06/07/21 1320 06/12/21 1032   06/06/21 0745  ceFEPIme (MAXIPIME) 2 g in sodium chloride 0.9 % 100 mL IVPB  Status:  Discontinued        2 g 200 mL/hr over 30 Minutes Intravenous Every 8 hours 06/06/21 0654 06/07/21 1312   05/09/21 0800  vancomycin (VANCOREADY) IVPB 1250 mg/250 mL  Status:  Discontinued         1,250 mg 166.7 mL/hr over 90 Minutes Intravenous Every 12 hours 05/08/21 1621 05/09/21 0954   05/08/21 1715  vancomycin (VANCOREADY) IVPB 2000 mg/400 mL        2,000 mg 200 mL/hr over 120 Minutes Intravenous  Once 05/08/21 1618 05/08/21 1943   05/02/21 1000  cefTAZidime (FORTAZ) 2 g in sodium chloride 0.9 % 100 mL IVPB  Status:  Discontinued        2 g 200 mL/hr over 30 Minutes Intravenous Every 8 hours 05/02/21 0846 05/10/21 0936   04/26/21 0756  ceFAZolin (ANCEF) 2-4 GM/100ML-% IVPB  Status:  Discontinued       Note to Pharmacy: [name] L: cabinet override      04/26/21 0756 04/26/21 0855   04/26/21 0600  ceFAZolin (ANCEF) IVPB 2g/100 mL premix        2 g 200 mL/hr over 30 Minutes Intravenous Every 8 hours 04/25/21 1131 04/26/21 0915   04/23/21 1400  cefTAZidime (FORTAZ) 1 g in sodium chloride 0.9 % 100 mL IVPB        1 g 200 mL/hr over 30 Minutes Intravenous Every 8 hours 04/23/21 1256 04/25/21 2331   04/20/21 1300  ceFEPIme (MAXIPIME) 2 g in sodium chloride 0.9 % 100 mL IVPB  Status:  Discontinued        2 g 200 mL/hr over 30 Minutes Intravenous Every 8 hours 04/20/21 1205 04/23/21 1241   04/14/21 0930  vancomycin (VANCOREADY) IVPB 1250 mg/250 mL        1,250 mg 166.7 mL/hr over 90 Minutes Intravenous Every 24 hours 04/13/21 0902 04/19/21 1247   04/13/21 0930  vancomycin (VANCOREADY) IVPB 2000 mg/400 mL        2,000 mg 200 mL/hr over 120 Minutes Intravenous  Once 04/13/21 0831 04/13/21 1158   04/12/21 0815  ceFEPIme (MAXIPIME) 2 g in sodium chloride 0.9 % 100 mL IVPB  Status:  Discontinued        2 g 200 mL/hr over 30 Minutes Intravenous Every 8 hours 04/12/21 0724 04/13/21 0829   04/06/21 1800  ceFAZolin (ANCEF) IVPB 2g/100 mL premix        2 g 200 mL/hr over 30 Minutes Intravenous Every 8 hours 04/06/21 1403 04/10/21 1913   04/04/21 1000  ceFEPIme (MAXIPIME) 2 g in sodium chloride 0.9 % 100 mL IVPB  Status:  Discontinued        2 g 200 mL/hr over 30 Minutes Intravenous  Every 8 hours 04/04/21 0919 04/06/21 1403       Subjective: nonverbal  Objective: Vitals:   08/20/21 0706 08/20/21 0752 08/20/21 0757 08/20/21 1201  BP:  (!) 142/109 (!) 142/109 (!) 145/111  Pulse:  87 94 92  Resp:  20 20 20   Temp:  98 F (36.7 C)  98 F (36.7 C)  TempSrc:  Oral  Oral  SpO2: 99% 93% 99% 98%  Weight:      Height:        Intake/Output Summary (Last 24 hours) at 08/20/2021 1352 Last data filed at 08/20/2021 1000 Gross per 24  hour  Intake 555 ml  Output 1050 ml  Net -495 ml   Filed Weights   08/18/21 0549 08/19/21 0319 08/20/21 0526  Weight: 99 kg 100 kg 97 kg    Examination:  General: No acute distress. Cardiovascular: RRR Lungs: trach collar, unlabored Abdomen: Soft, nontender, nondistended - peg Neurological: pupils fixed and dilated, not following commands, no meaningful movements Extremities: No clubbing or cyanosis. No edema.   Data Reviewed: I have personally reviewed following labs and imaging studies  CBC: Recent Labs  Lab 08/13/21 1637 08/16/21 0617 08/17/21 1205 08/18/21 1657  WBC 9.8 9.8 7.0 6.4  NEUTROABS  --   --  4.5 4.1  HGB 13.8 12.1* 11.8* 12.5*  HCT 41.5 36.6* 36.5* 39.1  MCV 91.6 91.7 92.4 93.5  PLT 272 217 242 [id number]    Basic Metabolic Panel: Recent Labs  Lab 08/13/21 1637 08/16/21 0617 08/17/21 1205 08/18/21 1657  NA 133* 137 140 139  K 4.7 4.6 4.3 4.4  CL 96* 101 103 102  CO2 27 28 30 29   GLUCOSE 127* 88 94 125*  BUN 40* 42* 45* 45*  CREATININE 0.63 0.72 0.76 0.67  CALCIUM 9.6 9.7 9.5 9.5  MG  --   --  2.0 2.0  PHOS  --   --  3.5 2.9    GFR: Estimated Creatinine Clearance: 129.1 mL/min (by C-G formula based on SCr of 0.67 mg/dL).  Liver Function Tests: Recent Labs  Lab 08/13/21 1637 08/17/21 1205 08/18/21 1657  AST 18 18 17   ALT 15 14 13   ALKPHOS 61 43 51  BILITOT 0.3 0.5 0.5  PROT 7.1 6.5 6.6  ALBUMIN 2.6* 2.2* 2.2*    CBG: No results for input(s): "GLUCAP" in the last 168  hours.   Recent Results (from the past 240 hour(s))  Culture, blood (Routine X 2) w Reflex to ID Panel     Status: None   Collection Time: 08/15/21  3:11 PM   Specimen: BLOOD LEFT HAND  Result Value Ref Range Status   Specimen Description BLOOD LEFT HAND  Final   Special Requests   Final    BOTTLES DRAWN AEROBIC AND ANAEROBIC Blood Culture adequate volume   Culture   Final    NO GROWTH 5 DAYS Performed at [facility], [address], [place], [place] [address]    Report Status 08/20/2021 FINAL  Final  Culture, blood (Routine X 2) w Reflex to ID Panel     Status: None   Collection Time: 08/15/21  3:12 PM   Specimen: BLOOD  Result Value Ref Range Status   Specimen Description BLOOD RIGHT ANTECUBITAL  Final   Special Requests   Final    BOTTLES DRAWN AEROBIC AND ANAEROBIC Blood Culture adequate volume   Culture   Final    NO GROWTH 5 DAYS Performed at [facility], [place] [address] [place] [address]    Report Status 08/20/2021 FINAL  Final         Radiology Studies: DG CHEST PORT 1 VIEW  Result Date: 08/19/2021 CLINICAL DATA:  Acute on chronic respiratory failure with hypoxia. EXAM: PORTABLE CHEST 1 VIEW COMPARISON:  08/13/2021 and prior radiographs FINDINGS: Cardiomegaly and low volume study again noted. Tracheostomy tube is again identified. Mild peribronchial thickening is unchanged. There is no evidence of focal airspace disease, pulmonary edema, suspicious pulmonary nodule/mass, pleural effusion, or pneumothorax. No acute bony abnormalities are identified. IMPRESSION: Cardiomegaly without evidence of acute cardiopulmonary disease. Electronically Signed  By: Harmon Pier M.D.   On: 08/19/2021 12:23        Scheduled Meds:  apixaban  5 mg Per Tube BID   atropine  2 drop Sublingual QID   carvedilol  25 mg Per Tube BID   chlorhexidine  15 mL Mouth/Throat BID   clonazepam  0.5 mg Per Tube TID   doxazosin  2 mg Per Tube Daily   feeding supplement  (OSMOLITE 1.5 CAL)  355 mL Per Tube QID   feeding supplement (PROSource TF)  45 mL Per Tube TID   fiber  1 packet Per Tube BID   free water  200 mL Per Tube Q6H   Gerhardt's butt cream   Topical Daily   glycopyrrolate  1 mg Per Tube TID   hydrALAZINE  100 mg Per Tube Q8H   isosorbide dinitrate  30 mg Per Tube TID   levETIRAcetam  750 mg Per Tube BID   mouth rinse  15 mL Mouth Rinse q12n4p   nutrition supplement (JUVEN)  1 packet Per Tube BID BM   pantoprazole sodium  40 mg Per Tube QHS   scopolamine  1 patch Transdermal Q72H   spironolactone  25 mg Per Tube Daily   valproic acid  750 mg Per Tube TID   Continuous Infusions:     LOS: 143 days    Time spent: over 30 min    Lacretia Nicks, MD [facility]   To contact the attending provider between 7A-7P or the covering provider during after hours 7P-7A, please log into the web site [email or web address] and access using universal [place] password for that web site. If you do not have the password, please call the hospital operator.  08/20/2021, 1:52 PM

## 2021-08-21 LAB — COMPREHENSIVE METABOLIC PANEL
ALT: 14 U/L (ref 0–44)
AST: 18 U/L (ref 15–41)
Albumin: 2.4 g/dL — ABNORMAL LOW (ref 3.5–5.0)
Alkaline Phosphatase: 58 U/L (ref 38–126)
Anion gap: 7 (ref 5–15)
BUN: 42 mg/dL — ABNORMAL HIGH (ref 6–20)
CO2: 30 mmol/L (ref 22–32)
Calcium: 9.4 mg/dL (ref 8.9–10.3)
Chloride: 103 mmol/L (ref 98–111)
Creatinine, Ser: 0.64 mg/dL (ref 0.61–1.24)
GFR, Estimated: >60 mL/min (ref >60)
Glucose, Bld: 147 mg/dL — ABNORMAL HIGH (ref 70–99)
Potassium: 4.4 mmol/L (ref 3.5–5.1)
Sodium: 140 mmol/L (ref 135–145)
Total Bilirubin: 0.3 mg/dL (ref 0.3–1.2)
Total Protein: 6.8 g/dL (ref 6.5–8.1)

## 2021-08-21 LAB — CBC WITH DIFFERENTIAL/PLATELET
Abs Immature Granulocytes: 0.03 10*3/uL (ref 0.00–0.07)
Basophils Absolute: 0 10*3/uL (ref 0.0–0.1)
Basophils Relative: 0 %
Eosinophils Absolute: 0.1 10*3/uL (ref 0.0–0.5)
Eosinophils Relative: 2 %
HCT: 39.7 % (ref 39.0–52.0)
Hemoglobin: 12.5 g/dL — ABNORMAL LOW (ref 13.0–17.0)
Immature Granulocytes: 0 %
Lymphocytes Relative: 18 %
Lymphs Abs: 1.6 10*3/uL (ref 0.7–4.0)
MCH: 30.1 pg (ref 26.0–34.0)
MCHC: 31.5 g/dL (ref 30.0–36.0)
MCV: 95.7 fL (ref 80.0–100.0)
Monocytes Absolute: 0.9 10*3/uL (ref 0.1–1.0)
Monocytes Relative: 11 %
Neutro Abs: 6.1 10*3/uL (ref 1.7–7.7)
Neutrophils Relative %: 69 %
Platelets: 236 10*3/uL (ref 150–400)
RBC: 4.15 MIL/uL — ABNORMAL LOW (ref 4.22–5.81)
RDW: 15 % (ref 11.5–15.5)
WBC: 8.7 10*3/uL (ref 4.0–10.5)
nRBC: 0 % (ref 0.0–0.2)

## 2021-08-21 LAB — MAGNESIUM: Magnesium: 2 mg/dL (ref 1.7–2.4)

## 2021-08-21 LAB — PHOSPHORUS: Phosphorus: 3.2 mg/dL (ref 2.5–4.6)

## 2021-08-21 MED ORDER — SODIUM CHLORIDE 0.9 % IV SOLN: 2.0000 g | Freq: Three times a day (TID) | INTRAVENOUS | Status: DC

## 2021-08-21 NOTE — Progress Notes (Signed)
NAME:  Billy Cherry, MRN:  [id number], DOB:  1970-12-31, LOS: 144 ADMISSION DATE:  03/30/2021, CONSULTATION DATE:  1/19 REFERRING MD:  Evette Georges, CHIEF COMPLAINT:  Found down, cardiac arrest   Brief Pt Description / Synopsis:  51 year old male with out-of-hospital V. fib cardiac arrest in setting of respiratory arrest due to suspected angioedema from ACE inhibitor leading to asphyxiation.  Concern on his 12 lead EKG for anterolateral ST elevation so he was taken to the cath lab where his study showed no significant coronary artery disease and an LVEF < 20%  Now with anoxic brain injury.  Pertinent  Medical History  Hypertension  Significant Hospital Events: Including procedures, antibiotic start and stop dates in addition to other pertinent events   1/19 admission, left heart cath without significant CAD.  Concern for anoxic brain injury 1/20: MRI pending, Neuro following 1/21: MRI shows areas of hypoxic-ischemic injury bilaterally. Fevered; cultures obtained. 1/23 severe brain damage 1/24 evidence of severe brain damage, remains critically ill 1/25: Plan for PEG today. Neuro exam unchanged.  Does exhibit new eyelid tremor like movements and lip/jaw tremor like vs clonus movement.  Bolused Keppra and increased maintenance dose.  Repeat EEG. CTH: concerning for progression of anoxic injury.  1/26: Repeat EEG unchanged and continues to show myoclonic seizures despite addition of Valproic acid yesterday. transfer to [facility] for continuous EEG 1/28: Continues to have myoclonic sz on EEG 1/29 No acute issues overnight 2/3 no meaningful neuro recovery. Diuresed 2/5 unchanged 2/6 New fever to 102, WBC 16K - treated with vancomycin for 7d based on cultures.  2/11: percutaneous tracheostomy 2/13 >2/20 on trach collar. No vent.  2/20 Trach change to 6 cuffless 2/27 status unchanged 3/13 unchanged 3/20 due for trach exchange given continue secretions (although improved) will stick to 6 cuffless  for exchange  06/05/2021 Continued thick  secretions, scant blood tinged 4/3/am 06/06/2021 CT of the chest with new PE and haziness consistent with pneumonia 5/30 continue tube feeding/free water  Interim History / Subjective:  Stable on ATC at 28%. Still having thick secretions.   Objective   Blood pressure (!) 127/91, pulse 98, temperature 98.7 F (37.1 C), temperature source Axillary, resp. rate 20, height 5\' 10"  (1.778 m), weight 97 kg, SpO2 98 %.    FiO2 (%):  [28 %] 28 %   Intake/Output Summary (Last 24 hours) at 08/21/2021 0850 Last data filed at 08/21/2021 0000 Gross per 24 hour  Intake 200 ml  Output 600 ml  Net -400 ml    Filed Weights   08/18/21 0549 08/19/21 0319 08/20/21 0526  Weight: 99 kg 100 kg 97 kg   Examination:  General: lying in bed in NAD HEENT: Trach intact, thick secretions noted, [place]/AT, eyes anicteric Resp: clear to auscultation bilaterally, no wheezing Cardiac: RRR Neuro: not following commands, no meaningful movements Abd: PEG in place, soft, non-distended  Assessment & Plan:  Out of hospital V-fib arrest Postarrest anoxic brain injury Myoclonus Respiratory failure with prolonged mechanical ventilation Tracheostomy dependent Cardiomyopathy AKI Pulmonary embolism Hypertension  -Trach care per protocol; not a candidate for decannulation -Continue frequent suctioning -Continue atropine drops, Glycopyrrolate per tube, scopolamine patch -Continue chest PT -Con't eliquis; anticipate he will need indefinitely -Overall prognosis remains poor.  Rest per primary team. PCCM will continue to follow weekly. Please call if needs arise sooner.   08/22/21, MD [facility] Office: [phone]   See Amion for personal pager PCCM on call pager [phone] until 7pm. Please  call Elink 7p-7a. [phone]

## 2021-08-21 NOTE — Progress Notes (Signed)
PROGRESS NOTE    Billy Cherry  LOV:[id number] DOB: [birth year] DOA: 03/30/2021 PCP: Patient, No Pcp Per  No chief complaint on file.   Brief Narrative:  [name] 51 years old male with past medical history of hypertension who was brought into the hospital after being found unresponsive by his wife.  Patient was initially admitted to [facility] hospital for out of hospital cardiac arrest and ventricular fibrillation with respiratory arrest suspected from angioedema from ACE inhibitor leading to asphyxiation.  Patient was then subsequently transferred to [facility].  Below is the Sequence of events during hospitalization,  1/19 left heart cath without significant CAD.  Concern for anoxic brain injury 1/21: MRI shows areas of hypoxic-ischemic injury bilaterally. Fevered; cultures obtained. 1/23 severe brain damage 1/25: PEG . Neuro exam unchanged.  Does exhibit new eyelid tremor like movements and lip/jaw tremor like vs clonus movement.  Bolused Keppra and increased maintenance dose.  Repeat EEG. CTH: concerning for progression of anoxic injury.  1/26: Repeat EEG unchanged and continues to show myoclonic seizures despite addition of Valproic acid yesterday.  03/30/2021 Transfer to [facility] from [place] for continuous EEG 1/28: Continues to have myoclonic sz on EEG 2/3 no meaningful neuro recovery. Diuresed 2/6 New fever to 102, WBC 16K - treated with vancomycin for 7d based on cultures.  2/11: percutaneous tracheostomy 2/13 >2/20 on trach collar. No vent.  2/20 Trach change to 6 cuffless 2/28: fever 3/13: eliquis started 4/3: fever 06/06/2021 CT of the chest with new PE and haziness consistent with pneumonia 4/5-4/9: responding well to PRN lasix 4/11 fever again after completion of abx- work up in progress 4/13 continues to have low-grade temps cultures from 4/11 no growth to date-suspect silent/occult aspiration-heparin changed back to Eliquis 4/13 4/14: Work-up negative with  no growth to date but red menance Tmax elevated 99.4 and respiratory rate up-x-ray did not show         any pneumonia but probably silently aspirating 08/04/21- ongoing.  Patient remains unresponsive.  Awaiting for placement and disposition has been difficult     Assessment & Plan:   Principal Problem:   Cardiac arrest [facility]) Active Problems:   Anoxic encephalopathy ([name])   Acute respiratory failure ([name])   HAP (hospital-acquired pneumonia)   Fever   DVT (deep venous thrombosis) ([name])   Pulmonary embolus ([name])   Dysphagia   Emesis   HTN (hypertension)   Pressure injury of skin   HFrEF (heart failure with reduced ejection fraction) ([name])   Myoclonus   Obesity (BMI 30-39.9)   Assessment and Plan: * Cardiac arrest ([name]) Suspected in the setting of angioedema from ace leading to asphyxiation S/p trach/peg Complicated by anoxic encephalopathy as above Echocardiogram with reduced LV systolic function <20%, with global hypokinesis. RV systolic function is preserved. No significant valvular disease.  Continue with carvedilol, hydralazine, isosorbide and spironolactone.  Patient with very poor prognosis.   Anoxic encephalopathy ([name]) MRI brain significant for hypoxic brain injury. Continue supportive care. Patient currently keeps his eyes open, but does not answer questions or follow commands. Unchanged neurologic status based on prior notes - pupils fixed, dilated (noted in prior notes). Continue with clonazepam, keppra, and valproic acid.  Neuro checks per unit protocol, aspiration precautions.    Acute respiratory failure ([name]) Status post trach.  Continue supportive care Managing secretions well.  Stable on trach collar at 5 L (28% FiO2) CXR 6/17 without acute cardiopulm dz Patient has Billy Cherry cuff less trach #6, continue care per  protocol.   Fever Last fever 6/13 at 5 am (temp to 99.9 6/15) Blood cultures NGTDx3 resp culture pending collection Completed 7 days abx    HAP  (hospital-acquired pneumonia) Previously had completed vancomycin for MSSA and [name] in early march.  Received more than 7-day course of IV Fortaz. On 4/3, CT chest was ordered that indicated an area of alveolar infiltration that may represent pneumonia.  Tracheal aspirate has been sent for culture and is significant for normal respiratory flora. Concerning for aspiration. -s/p Unasyn x 7 days As above repeat fever on 6/13, CXR 6/11 concerning for pneumonia (CXR 6/17 without evidence cardiopulm dz) Continue on cefepime (completed 7 days) Follow blood cultures NGx3, sputum cx not collected  Pulmonary embolus ([name]) Noted to have possible acute on chronic pulmonary embolism on CT.  Also have some evidence of possible pulmonary hypertension.   Currently patient is on apixaban with good toleration.   DVT (deep venous thrombosis) ([name]) Duplex ultrasound of lower extremity on 05/15/2021 showed age indeterminate deep vein thrombosis involving the left femoral vein, left proximal profunda vein, left posterior tibial veins, left popliteal vein, and left peroneal veins.  Currently continue anticoagulation with apixaban.    Emesis Resolved, monitor on tube feeds  Dysphagia Continue PEG tube feeding.  Tolerating but high risk of aspiration -Aspiration precautions  Pressure injury of skin Lateral penis, posterior right heel, right foot. Not present on admission. Pressure Injury 08/16/21 Foot Right;Upper;Posterior Stage 1 -  Intact skin with non-blanchable redness of Billy Cherry localized area usually over Billy Cherry bony prominence. (Active)  08/16/21 1629  Location: Foot  Location Orientation: Right;Upper;Posterior  Staging: Stage 1 -  Intact skin with non-blanchable redness of Billy Cherry localized area usually over Billy Cherry bony prominence.  Wound Description (Comments):   Present on Admission:      Pressure Injury 08/16/21 Heel Right Stage 1 -  Intact skin with non-blanchable redness of Billy Cherry localized area  usually over Billy Cherry bony prominence. (Active)  08/16/21 1632  Location: Heel  Location Orientation: Right  Staging: Stage 1 -  Intact skin with non-blanchable redness of Geoff Dacanay localized area usually over Cherisa Brucker bony prominence.  Wound Description (Comments):   Present on Admission:       HTN (hypertension) Continue blood pressure control with amlodipine, carvedilol, hydralazine, isosorbide and spironolactone.   HFrEF (heart failure with reduced ejection fraction) ([place]) 2D echocardiogram-LVEF < 20%,  Patient euvolemic, continue medical therapy with carvedilol, isosorbide/ hydralazine and spironolactone As needed furosemide.   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  Continue current AED with Klonopin, Keppra, valproic acid. Per neuro notes, suppressing his myoclonus will not ultimately change the prognosis   Hypernatremia-resolved as of 08/07/2021 Resolved with free water flushes  Obesity (BMI 30-39.9) noted      DVT prophylaxis: eliquis Code Status: full Family Communication: none Disposition:   Status is: Inpatient Remains inpatient appropriate because: continued need for abx, placement   Consultants:  ID Palliative Neuro Cards PCCM  Procedures:  Trach peg  Antimicrobials:  Anti-infectives (From admission, onward)    Start     Dose/Rate Route Frequency Ordered Stop   08/21/21 0845  ceFEPIme (MAXIPIME) 2 g in sodium chloride 0.9 % 100 mL IVPB  Status:  Discontinued        2 g 200 mL/hr over 30 Minutes Intravenous Every 8 hours 08/21/21 0750 08/21/21 0900   08/13/21 2000  ceFEPIme (MAXIPIME) 2 g in sodium chloride 0.9 % 100 mL IVPB        2 g  200 mL/hr over 30 Minutes Intravenous Every 8 hours 08/13/21 1855 08/19/21 2130   06/07/21 1500  Ampicillin-Sulbactam (UNASYN) 3 g in sodium chloride 0.9 % 100 mL IVPB        3 g 200 mL/hr over 30 Minutes Intravenous Every 6 hours 06/07/21 1320 06/12/21 1032   06/06/21 0745  ceFEPIme (MAXIPIME) 2 g in sodium chloride 0.9 % 100 mL IVPB   Status:  Discontinued        2 g 200 mL/hr over 30 Minutes Intravenous Every 8 hours 06/06/21 0654 06/07/21 1312   05/09/21 0800  vancomycin (VANCOREADY) IVPB 1250 mg/250 mL  Status:  Discontinued        1,250 mg 166.7 mL/hr over 90 Minutes Intravenous Every 12 hours 05/08/21 1621 05/09/21 0954   05/08/21 1715  vancomycin (VANCOREADY) IVPB 2000 mg/400 mL        2,000 mg 200 mL/hr over 120 Minutes Intravenous  Once 05/08/21 1618 05/08/21 1943   05/02/21 1000  cefTAZidime (FORTAZ) 2 g in sodium chloride 0.9 % 100 mL IVPB  Status:  Discontinued        2 g 200 mL/hr over 30 Minutes Intravenous Every 8 hours 05/02/21 0846 05/10/21 0936   04/26/21 0756  ceFAZolin (ANCEF) 2-4 GM/100ML-% IVPB  Status:  Discontinued       Note to Pharmacy: Kevan Ny L: cabinet override      04/26/21 0756 04/26/21 0855   04/26/21 0600  ceFAZolin (ANCEF) IVPB 2g/100 mL premix        2 g 200 mL/hr over 30 Minutes Intravenous Every 8 hours 04/25/21 1131 04/26/21 0915   04/23/21 1400  cefTAZidime (FORTAZ) 1 g in sodium chloride 0.9 % 100 mL IVPB        1 g 200 mL/hr over 30 Minutes Intravenous Every 8 hours 04/23/21 1256 04/25/21 2331   04/20/21 1300  ceFEPIme (MAXIPIME) 2 g in sodium chloride 0.9 % 100 mL IVPB  Status:  Discontinued        2 g 200 mL/hr over 30 Minutes Intravenous Every 8 hours 04/20/21 1205 04/23/21 1241   04/14/21 0930  vancomycin (VANCOREADY) IVPB 1250 mg/250 mL        1,250 mg 166.7 mL/hr over 90 Minutes Intravenous Every 24 hours 04/13/21 0902 04/19/21 1247   04/13/21 0930  vancomycin (VANCOREADY) IVPB 2000 mg/400 mL        2,000 mg 200 mL/hr over 120 Minutes Intravenous  Once 04/13/21 0831 04/13/21 1158   04/12/21 0815  ceFEPIme (MAXIPIME) 2 g in sodium chloride 0.9 % 100 mL IVPB  Status:  Discontinued        2 g 200 mL/hr over 30 Minutes Intravenous Every 8 hours 04/12/21 0724 04/13/21 0829   04/06/21 1800  ceFAZolin (ANCEF) IVPB 2g/100 mL premix        2 g 200 mL/hr over 30 Minutes  Intravenous Every 8 hours 04/06/21 1403 04/10/21 1913   04/04/21 1000  ceFEPIme (MAXIPIME) 2 g in sodium chloride 0.9 % 100 mL IVPB  Status:  Discontinued        2 g 200 mL/hr over 30 Minutes Intravenous Every 8 hours 04/04/21 0919 04/06/21 1403       Subjective: nonverbal  Objective: Vitals:   08/21/21 1000 08/21/21 1150 08/21/21 1202 08/21/21 1504  BP: (!) 128/97 116/87  (!) 137/103  Pulse: 94 96  95  Resp: 18 16  16   Temp: 98.8 F (37.1 C) 97.6 F (36.4 C)    TempSrc:  Oral Axillary    SpO2: 97% 98% 99% 100%  Weight:      Height:        Intake/Output Summary (Last 24 hours) at 08/21/2021 1548 Last data filed at 08/21/2021 1300 Gross per 24 hour  Intake 555 ml  Output 600 ml  Net -45 ml   Filed Weights   08/18/21 0549 08/19/21 0319 08/20/21 0526  Weight: 99 kg 100 kg 97 kg    Examination:  General: No acute distress. Cardiovascular: RRR Lungs: trach collar Abdomen: Soft, nontender, nondistended - peg Neurological: nonreponsive, no meaningful movements Extremities: No clubbing or cyanosis. No edema.  Data Reviewed: I have personally reviewed following labs and imaging studies  CBC: Recent Labs  Lab 08/16/21 0617 08/17/21 1205 08/18/21 1657 08/21/21 0749  WBC 9.8 7.0 6.4 8.7  NEUTROABS  --  4.5 4.1 6.1  HGB 12.1* 11.8* 12.5* 12.5*  HCT 36.6* 36.5* 39.1 39.7  MCV 91.7 92.4 93.5 95.7  PLT 217 242 241 [id number]    Basic Metabolic Panel: Recent Labs  Lab 08/16/21 0617 08/17/21 1205 08/18/21 1657 08/21/21 0749  NA 137 140 139 140  K 4.6 4.3 4.4 4.4  CL 101 103 102 103  CO2 28 30 29 30   GLUCOSE 88 94 125* 147*  BUN 42* 45* 45* 42*  CREATININE 0.72 0.76 0.67 0.64  CALCIUM 9.7 9.5 9.5 9.4  MG  --  2.0 2.0 2.0  PHOS  --  3.5 2.9 3.2    GFR: Estimated Creatinine Clearance: 129.1 mL/min (by C-G formula based on SCr of 0.64 mg/dL).  Liver Function Tests: Recent Labs  Lab 08/17/21 1205 08/18/21 1657 08/21/21 0749  AST 18 17 18   ALT 14 13 14    ALKPHOS 43 51 58  BILITOT 0.5 0.5 0.3  PROT 6.5 6.6 6.8  ALBUMIN 2.2* 2.2* 2.4*    CBG: No results for input(s): "GLUCAP" in the last 168 hours.   Recent Results (from the past 240 hour(s))  Culture, blood (Routine X 2) w Reflex to ID Panel     Status: None   Collection Time: 08/15/21  3:11 PM   Specimen: BLOOD LEFT HAND  Result Value Ref Range Status   Specimen Description BLOOD LEFT HAND  Final   Special Requests   Final    BOTTLES DRAWN AEROBIC AND ANAEROBIC Blood Culture adequate volume   Culture   Final    NO GROWTH 5 DAYS Performed at [facility], [address] [place], [place] [address]    Report Status 08/20/2021 FINAL  Final  Culture, blood (Routine X 2) w Reflex to ID Panel     Status: None   Collection Time: 08/15/21  3:12 PM   Specimen: BLOOD  Result Value Ref Range Status   Specimen Description BLOOD RIGHT ANTECUBITAL  Final   Special Requests   Final    BOTTLES DRAWN AEROBIC AND ANAEROBIC Blood Culture adequate volume   Culture   Final    NO GROWTH 5 DAYS Performed at [facility], [place] [address] [place], [place] [address]    Report Status 08/20/2021 FINAL  Final         Radiology Studies: No results found.      Scheduled Meds:  apixaban  5 mg Per Tube BID   atropine  2 drop Sublingual QID   carvedilol  25 mg Per Tube BID   chlorhexidine  15 mL Mouth/Throat BID   clonazepam  0.5 mg Per Tube TID  doxazosin  2 mg Per Tube Daily   feeding supplement (OSMOLITE 1.5 CAL)  355 mL Per Tube QID   feeding supplement (PROSource TF)  45 mL Per Tube TID   fiber  1 packet Per Tube BID   free water  200 mL Per Tube Q6H   Gerhardt's butt cream   Topical Daily   glycopyrrolate  1 mg Per Tube TID   hydrALAZINE  100 mg Per Tube Q8H   isosorbide dinitrate  30 mg Per Tube TID   levETIRAcetam  750 mg Per Tube BID   mouth rinse  15 mL Mouth Rinse q12n4p   nutrition supplement (JUVEN)  1 packet Per Tube BID BM   pantoprazole sodium  40 mg Per  Tube QHS   scopolamine  1 patch Transdermal Q72H   spironolactone  25 mg Per Tube Daily   valproic acid  750 mg Per Tube TID   Continuous Infusions:     LOS: 144 days    Time spent: over 30 min    Fayrene Helper, MD [facility]   To contact the attending provider between 7A-7P or the covering provider during after hours 7P-7A, please log into the web site [email or web address] and access using universal [place] password for that web site. If you do not have the password, please call the hospital operator.  08/21/2021, 3:48 PM

## 2021-08-21 NOTE — TOC Progression Note (Signed)
Transition of Care [facility]) - Progression Note    Patient Details  Name: Billy Cherry MRN: [id number] Date of Birth: 04-Feb-1971  Transition of Care [facility]) CM/SW Contact  Janae Bridgeman, RN Phone Number: 08/21/2021, 8:23 AM  Clinical Narrative:    CM called and spoke with the patient's wife, Billy Cherry, on the phone and she stated that she did not tour [facility] this weekend but plans to tour the facility today to  approve start of insurance authorization for placement at the facility if a SNF bed becomes available.  I asked that she return my call to confirm this afternoon.  I sent an email to Adela Glimpse, Admission director at the facility to inquire if a bed might be available this week for placement.  CM and MSW with DTP Team will continue to follow the patient for SNF placement versus home with private duty with [employer].   Expected Discharge Plan: Home w Home Health Services Barriers to Discharge: Other (must enter comment) (Patient's wife - requested private duty services through alternative provider for 12 hours shifts for staffing at the home.)  Expected Discharge Plan and Services Expected Discharge Plan: Home w Home Health Services In-house Referral: Clinical Social Work Discharge Planning Services: CM Consult, Follow-up appt scheduled Post Acute Care Choice: Home Health Living arrangements for the past 2 months: Single Family Home Expected Discharge Date: 08/07/21               DME Arranged: Janina Mayo supplies, Tube feeding, Suction, Hospital bed, Oxygen DME Agency: [employer]       HH Arranged: RN (Private Duty Nursing through [employer] versus [place]) HH Agency: [employer] Services Date HH Agency Contacted: 07/04/21 Time HH Agency Contacted: 1000 Representative spoke with at [facility] Agency: Jeanmarie Plant, Director with [employer] [phone]   Social Determinants of Health (SDOH) Interventions    Readmission Risk Interventions     04/14/2021   11:14 AM  Readmission Risk Prevention Plan  Transportation Screening Complete  HRI or Home Care Consult Complete  Social Work Consult for Recovery Care Planning/Counseling Complete  Palliative Care Screening Complete  Medication Review Oceanographer) Complete

## 2021-08-21 NOTE — Progress Notes (Signed)
Patient's wife arrived to department around 2215 with Patient's mother. Wife lowered the side rail and laid in the bed with the patient and I did not feel that this was safe and I informed her of this.  She reluctantly moved and stated how she will be glad when he leaves.  A few moments later the wife was at the linen cart with the dirty gloves she was using and I informed her that that was inappropriate and against infection prevention protocols she again became angry and said profanity.  She stated he needed mouth care and I informed her I had provided mouth care with the prescribed regimen prior to her arrival.  While providing incontinent care she remained at the bedside talking disparagingly about the staff and care he has received during his time as a patient here and stating to the patient he would be moved to get better care at [facility].  This nurse did not engage her when she spoke disparagingly.  Myself and Antonietta Jewel, NT performed necessary patient care and I inquired if there was anything else she need before I left the room and used profanity to refer to me while speaking the the mother at the bedside as I was leaving the room.

## 2021-08-22 ENCOUNTER — Inpatient Hospital Stay (HOSPITAL_COMMUNITY): Payer: BC Managed Care – PPO

## 2021-08-22 LAB — CBC WITH DIFFERENTIAL/PLATELET
Abs Immature Granulocytes: 0.04 10*3/uL (ref 0.00–0.07)
Basophils Absolute: 0 10*3/uL (ref 0.0–0.1)
Basophils Relative: 0 %
Eosinophils Absolute: 0.1 10*3/uL (ref 0.0–0.5)
Eosinophils Relative: 1 %
HCT: 38.7 % — ABNORMAL LOW (ref 39.0–52.0)
Hemoglobin: 12.4 g/dL — ABNORMAL LOW (ref 13.0–17.0)
Immature Granulocytes: 0 %
Lymphocytes Relative: 16 %
Lymphs Abs: 1.6 10*3/uL (ref 0.7–4.0)
MCH: 30.3 pg (ref 26.0–34.0)
MCHC: 32 g/dL (ref 30.0–36.0)
MCV: 94.6 fL (ref 80.0–100.0)
Monocytes Absolute: 1.5 10*3/uL — ABNORMAL HIGH (ref 0.1–1.0)
Monocytes Relative: 14 %
Neutro Abs: 7.1 10*3/uL (ref 1.7–7.7)
Neutrophils Relative %: 69 %
Platelets: 263 10*3/uL (ref 150–400)
RBC: 4.09 MIL/uL — ABNORMAL LOW (ref 4.22–5.81)
RDW: 15 % (ref 11.5–15.5)
WBC: 10.4 10*3/uL (ref 4.0–10.5)
nRBC: 0 % (ref 0.0–0.2)

## 2021-08-22 LAB — PROCALCITONIN: Procalcitonin: <0.1 ng/mL

## 2021-08-22 LAB — LACTIC ACID, PLASMA: Lactic Acid, Venous: 1.9 mmol/L (ref 0.5–1.9)

## 2021-08-22 MED ORDER — ACETAMINOPHEN 325 MG PO TABS
650.0000 mg | ORAL_TABLET | Freq: Four times a day (QID) | ORAL | Status: DC | PRN
  Administered 2021-08-22 – 2021-09-05 (×3): 650 mg
  Filled 2021-08-22 (×3): qty 2

## 2021-08-22 MED ORDER — ACETAMINOPHEN 325 MG PO TABS: 650.0000 mg | ORAL_TABLET | Freq: Four times a day (QID) | ORAL | Status: DC | PRN

## 2021-08-22 MED ORDER — POLYETHYLENE GLYCOL 3350 17 G PO PACK
17.0000 g | PACK | Freq: Every day | ORAL | Status: DC
  Administered 2021-08-22 – 2021-10-04 (×36): 17 g
  Filled 2021-08-22 (×36): qty 1

## 2021-08-22 NOTE — Progress Notes (Addendum)
  TRH night cross cover note:   I was notified by RN of the patient's elevated temperature of 100 (noted around 0100 on 08/22/21). HR also noted to be mildly elevated into the low 100s, which appears new over the late afternoon into this night shift.   Per my chart review, including review of most recent rounding hospitalist progress note,  it looks like most recent prior fever occurred on 08/15/2021, with ensuing temp of 99.9 noted on 08/17/2021.  Chest x-ray on 08/13/2021 was concerning for pneumonia, and the patient subsequently completed a 7-day course of cefepime.   In the setting of the patient's highest temperature in a week, along with new onset mild tachycardia, will check CXR, UA, cbc w/ diff, blood cx's x 2 at this time, in addition to checking lactic acid and procalcitonin level. Prn acetaminophen via tube has been ordered.  Refraining from initiation of empiric antibiotics at this time pending the above work-up.     Newton Pigg, DO Hospitalist

## 2021-08-22 NOTE — TOC Progression Note (Signed)
Transition of Care [facility]) - Progression Note    Patient Details  Name: Billy Cherry MRN: [id number] Date of Birth: 12-Nov-1970  Transition of Care [facility]) CM/SW Contact  Janae Bridgeman, RN Phone Number: 08/22/2021, 10:39 AM  Clinical Narrative:    CM spoke with Haskel Schroeder, Admissions Coordinator at [facility] facility and she states that the patient's wife was present at the facility to tour the Sub-Acute Unit just in case an admission bed became available to offer to the patient.  Adela Glimpse, Admissions Director with SAU unit is aware that the patient may need an accepting SNF facility since [facility] is still coordinating private duty nursing for the home.  The patient's wife was agreeable to tour the unit but [facility] has not given a bed offer at this time but has clinicals to review for possible bed offer in the near future.  I asked Julien Girt, Admission Director to follow up with me if [facility] was able to offer a bed to the patient so that I could meet with the wife and discuss disposition.  CM and MSW with DTP Team will continue to follow the patient for needed SNF placement.   Expected Discharge Plan: Home w Home Health Services Barriers to Discharge: Other (must enter comment) (Patient's wife - requested private duty services through alternative provider for 12 hours shifts for staffing at the home.)  Expected Discharge Plan and Services Expected Discharge Plan: Home w Home Health Services In-house Referral: Clinical Social Work Discharge Planning Services: CM Consult, Follow-up appt scheduled Post Acute Care Choice: Home Health Living arrangements for the past 2 months: Single Family Home Expected Discharge Date: 08/07/21               DME Arranged: Janina Mayo supplies, Tube feeding, Suction, Hospital bed, Oxygen DME Agency: [employer]       HH Arranged: RN (Private Duty Nursing through [employer] versus [place]) HH Agency: [employer] Services Date HH  Agency Contacted: 07/04/21 Time HH Agency Contacted: 1000 Representative spoke with at [facility] Agency: Jeanmarie Plant, Director with [employer] [phone]   Social Determinants of Health (SDOH) Interventions    Readmission Risk Interventions    04/14/2021   11:14 AM  Readmission Risk Prevention Plan  Transportation Screening Complete  HRI or Home Care Consult Complete  Social Work Consult for Recovery Care Planning/Counseling Complete  Palliative Care Screening Complete  Medication Review Oceanographer) Complete

## 2021-08-22 NOTE — Progress Notes (Signed)
PROGRESS NOTE    Billy Cherry  [id number] DOB: 1970-05-10 DOA: 03/30/2021 PCP: Billy Cherry  No chief complaint on file.   Brief Narrative:  [name] 51 years old male with past medical history of hypertension who was brought into the hospital after being found unresponsive by his wife.  Patient was initially admitted to [facility] for out of hospital cardiac arrest and ventricular fibrillation with respiratory arrest suspected from angioedema from ACE inhibitor leading to asphyxiation.  Patient was then subsequently transferred to [facility].  Below is the Sequence of events during hospitalization,  1/19 left heart cath without significant CAD.  Concern for anoxic brain injury 1/21: MRI shows areas of hypoxic-ischemic injury bilaterally. Fevered; cultures obtained. 1/23 severe brain damage 1/25: PEG . Neuro exam unchanged.  Does exhibit new eyelid tremor like movements and lip/jaw tremor like vs clonus movement.  Bolused Keppra and increased maintenance dose.  Repeat EEG. CTH: concerning for progression of anoxic injury.  1/26: Repeat EEG unchanged and continues to show myoclonic seizures despite addition of Valproic acid yesterday.  03/30/2021 Transfer to [name] from [place] for continuous EEG 1/28: Continues to have myoclonic sz on EEG 2/3 no meaningful neuro recovery. Diuresed 2/6 New fever to 102, WBC 16K - treated with vancomycin for 7d based on cultures.  2/11: percutaneous tracheostomy 2/13 >2/20 on trach collar. No vent.  2/20 Trach change to 6 cuffless 2/28: fever 3/13: eliquis started 4/3: fever 06/06/2021 CT of the chest with new PE and haziness consistent with pneumonia 4/5-4/9: responding well to PRN lasix 4/11 fever again after completion of abx- work up in progress 4/13 continues to have low-grade temps cultures from 4/11 no growth to date-suspect silent/occult aspiration-heparin changed back to Eliquis 4/13 4/14: Work-up negative with  no growth to date but red menance Tmax elevated 99.4 and respiratory rate up-x-ray did not show         any pneumonia but probably silently aspirating 08/04/21- ongoing.  Patient remains unresponsive.  Awaiting for placement and disposition has been difficult 6/20 -> difficult placement team working on options, [facility]?     Assessment & Plan:   Principal Problem:   Cardiac arrest Billy Cherry) Active Problems:   Anoxic encephalopathy (Billy Cherry)   Acute respiratory failure (Billy Cherry)   HAP (hospital-acquired pneumonia)   Fever   DVT (deep venous thrombosis) (Billy Cherry)   Pulmonary embolus (Billy Cherry)   Dysphagia   Emesis   HTN (hypertension)   Pressure injury of skin   HFrEF (heart failure with reduced ejection fraction) (Billy Cherry)   Myoclonus   Obesity (BMI 30-39.9)   Assessment and Plan: * Cardiac arrest (Billy Cherry) Suspected in the setting of angioedema from ace leading to asphyxiation S/p trach/peg Complicated by anoxic encephalopathy as above Echocardiogram with reduced LV systolic function [id number], with global hypokinesis. RV systolic function is preserved. No significant valvular disease.  Continue with carvedilol, hydralazine, isosorbide and spironolactone.  Patient with very poor prognosis.   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 Continue supportive care. Patient currently keeps his eyes open, but does not answer questions or follow commands. Unchanged neurologic status based on prior notes - pupils fixed, dilated (noted in prior notes). Continue with clonazepam, keppra, and valproic acid.  Neuro checks Cherry unit protocol, aspiration precautions.    Acute respiratory failure (Billy Cherry) Status post trach.  Continue supportive care Managing secretions well.  Stable on trach collar at 5 L (28% FiO2) CXR 6/17 without acute cardiopulm dz Patient  has Rivky Clendenning cuff less trach #6, continue care Cherry protocol.   Fever Last fever 6/13 at 5 am  temp to 100 6/19, but not technically true  fever  w/u Cherry covering doc with negative procal, pending blood cultures, WBC 10.4, CXR mild bibasilar opacities (hold off on abx at this time unless clear si/sx or evidence infection) Blood cultures NGTDx3 resp culture pending collection Completed 7 days abx    HAP (hospital-acquired pneumonia) Previously had completed vancomycin for MSSA and [name] in early march.  Received more than 7-day course of IV Fortaz. On 4/3, CT chest was ordered that indicated an area of alveolar infiltration that may represent pneumonia.  Tracheal aspirate has been sent for culture and is significant for normal respiratory flora. Concerning for aspiration. Has completed multiple course of abx   Pulmonary embolus (Billy Cherry) Noted to have possible acute on chronic pulmonary embolism on CT.  Also have some evidence of possible pulmonary hypertension.   Currently patient is on apixaban with good toleration.   DVT (deep venous thrombosis) (Billy Cherry) Duplex ultrasound of lower extremity on 05/15/2021 showed age indeterminate deep vein thrombosis involving the left femoral vein, left proximal profunda vein, left posterior tibial veins, left popliteal vein, and left peroneal veins.  Currently continue anticoagulation with apixaban.    Emesis Resolved, monitor on tube feeds  Dysphagia Continue PEG tube feeding.  Tolerating but high risk of aspiration -Aspiration precautions  Pressure injury of skin Lateral penis, posterior right heel, right foot. Not present on admission. Pressure Injury 08/16/21 Foot Right;Upper;Posterior Stage 1 -  Intact skin with non-blanchable redness of Billy Cherry localized area usually over Billy Cherry bony prominence. (Active)  08/16/21 1629  Location: Foot  Location Orientation: Right;Upper;Posterior  Staging: Stage 1 -  Intact skin with non-blanchable redness of Billy Cherry localized area usually over Billy Cherry bony prominence.  Wound Description (Comments):   Present on Admission:      Pressure Injury  08/16/21 Heel Right Stage 1 -  Intact skin with non-blanchable redness of Billy Cherry localized area usually over Selisa Tensley bony prominence. (Active)  08/16/21 1632  Location: Heel  Location Orientation: Right  Staging: Stage 1 -  Intact skin with non-blanchable redness of Lilleigh Hechavarria localized area usually over Gaje Tennyson bony prominence.  Wound Description (Comments):   Present on Admission:       HTN (hypertension) Continue blood pressure control with amlodipine, carvedilol, hydralazine, isosorbide and spironolactone.   HFrEF (heart failure with reduced ejection fraction) ([place]) 2D echocardiogram-LVEF < 20%,  Patient euvolemic, continue medical therapy with carvedilol, isosorbide/ hydralazine and spironolactone As needed furosemide.   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  Continue current AED with Klonopin, Keppra, valproic acid. Cherry neuro notes, suppressing his myoclonus will not ultimately change the prognosis   Hypernatremia-resolved as of 08/07/2021 Resolved with free water flushes  Obesity (BMI 30-39.9) noted      DVT prophylaxis: eliquis Code Status: full Family Communication: none Disposition:   Status is: Inpatient Remains inpatient appropriate because: continued need for abx, placement   Consultants:  ID Palliative Neuro Cards PCCM  Procedures:  Trach peg  Antimicrobials:  Anti-infectives (From admission, onward)    Start     Dose/Rate Route Frequency Ordered Stop   08/21/21 0845  ceFEPIme (MAXIPIME) 2 g in sodium chloride 0.9 % 100 mL IVPB  Status:  Discontinued        2 g 200 mL/hr over 30 Minutes Intravenous Every 8 hours 08/21/21 0750 08/21/21 0900   08/13/21 2000  ceFEPIme (MAXIPIME) 2 g in  sodium chloride 0.9 % 100 mL IVPB        2 g 200 mL/hr over 30 Minutes Intravenous Every 8 hours 08/13/21 1855 08/19/21 2130   06/07/21 1500  Ampicillin-Sulbactam (UNASYN) 3 g in sodium chloride 0.9 % 100 mL IVPB        3 g 200 mL/hr over 30 Minutes Intravenous Every 6 hours 06/07/21 1320 06/12/21  1032   06/06/21 0745  ceFEPIme (MAXIPIME) 2 g in sodium chloride 0.9 % 100 mL IVPB  Status:  Discontinued        2 g 200 mL/hr over 30 Minutes Intravenous Every 8 hours 06/06/21 0654 06/07/21 1312   05/09/21 0800  vancomycin (VANCOREADY) IVPB 1250 mg/250 mL  Status:  Discontinued        1,250 mg 166.7 mL/hr over 90 Minutes Intravenous Every 12 hours 05/08/21 1621 05/09/21 0954   05/08/21 1715  vancomycin (VANCOREADY) IVPB 2000 mg/400 mL        2,000 mg 200 mL/hr over 120 Minutes Intravenous  Once 05/08/21 1618 05/08/21 1943   05/02/21 1000  cefTAZidime (FORTAZ) 2 g in sodium chloride 0.9 % 100 mL IVPB  Status:  Discontinued        2 g 200 mL/hr over 30 Minutes Intravenous Every 8 hours 05/02/21 0846 05/10/21 0936   04/26/21 0756  ceFAZolin (ANCEF) 2-4 GM/100ML-% IVPB  Status:  Discontinued       Note to Pharmacy: [name] L: cabinet override      04/26/21 0756 04/26/21 0855   04/26/21 0600  ceFAZolin (ANCEF) IVPB 2g/100 mL premix        2 g 200 mL/hr over 30 Minutes Intravenous Every 8 hours 04/25/21 1131 04/26/21 0915   04/23/21 1400  cefTAZidime (FORTAZ) 1 g in sodium chloride 0.9 % 100 mL IVPB        1 g 200 mL/hr over 30 Minutes Intravenous Every 8 hours 04/23/21 1256 04/25/21 2331   04/20/21 1300  ceFEPIme (MAXIPIME) 2 g in sodium chloride 0.9 % 100 mL IVPB  Status:  Discontinued        2 g 200 mL/hr over 30 Minutes Intravenous Every 8 hours 04/20/21 1205 04/23/21 1241   04/14/21 0930  vancomycin (VANCOREADY) IVPB 1250 mg/250 mL        1,250 mg 166.7 mL/hr over 90 Minutes Intravenous Every 24 hours 04/13/21 0902 04/19/21 1247   04/13/21 0930  vancomycin (VANCOREADY) IVPB 2000 mg/400 mL        2,000 mg 200 mL/hr over 120 Minutes Intravenous  Once 04/13/21 0831 04/13/21 1158   04/12/21 0815  ceFEPIme (MAXIPIME) 2 g in sodium chloride 0.9 % 100 mL IVPB  Status:  Discontinued        2 g 200 mL/hr over 30 Minutes Intravenous Every 8 hours 04/12/21 0724 04/13/21 0829   04/06/21  1800  ceFAZolin (ANCEF) IVPB 2g/100 mL premix        2 g 200 mL/hr over 30 Minutes Intravenous Every 8 hours 04/06/21 1403 04/10/21 1913   04/04/21 1000  ceFEPIme (MAXIPIME) 2 g in sodium chloride 0.9 % 100 mL IVPB  Status:  Discontinued        2 g 200 mL/hr over 30 Minutes Intravenous Every 8 hours 04/04/21 0919 04/06/21 1403       Subjective: nonverbal  Objective: Vitals:   08/22/21 0823 08/22/21 1125 08/22/21 1448 08/22/21 1519  BP: (!) 132/98  112/89 112/89  Pulse: 95 94  96  Resp: 20 18  18  Temp: 98.9 F (37.2 C)     TempSrc: Axillary     SpO2: 96% 98%  97%  Weight:      Height:        Intake/Output Summary (Last 24 hours) at 08/22/2021 1937 Last data filed at 08/22/2021 0109 Gross Cherry 24 hour  Intake --  Output 700 ml  Net -700 ml   Filed Weights   08/19/21 0319 08/20/21 0526 08/22/21 0342  Weight: 100 kg 97 kg 97 kg    Examination:  General: No acute distress. Cardiovascular: RRr Lungs: trach collar, unlabored Abdomen: Soft, nontender, nondistended - peg Neurological: fixed and dilated pupils, no meaningful movements  Extremities: No clubbing or cyanosis. No edema.  Data Reviewed: I have personally reviewed following labs and imaging studies  CBC: Recent Labs  Lab 08/16/21 0617 08/17/21 1205 08/18/21 1657 08/21/21 0749 08/22/21 0436  WBC 9.8 7.0 6.4 8.7 10.4  NEUTROABS  --  4.5 4.1 6.1 7.1  HGB 12.1* 11.8* 12.5* 12.5* 12.4*  HCT 36.6* 36.5* 39.1 39.7 38.7*  MCV 91.7 92.4 93.5 95.7 94.6  PLT 217 242 241 236 263    Basic Metabolic Panel: Recent Labs  Lab 08/16/21 0617 08/17/21 1205 08/18/21 1657 08/21/21 0749  NA 137 140 139 140  K 4.6 4.3 4.4 4.4  CL 101 103 102 103  CO2 28 30 29 30   GLUCOSE 88 94 125* 147*  BUN 42* 45* 45* 42*  CREATININE 0.72 0.76 0.67 0.64  CALCIUM 9.7 9.5 9.5 9.4  MG  --  2.0 2.0 2.0  PHOS  --  3.5 2.9 3.2    GFR: Estimated Creatinine Clearance: 129.1 mL/min (by C-G formula based on SCr of 0.64  mg/dL).  Liver Function Tests: Recent Labs  Lab 08/17/21 1205 08/18/21 1657 08/21/21 0749  AST 18 17 18   ALT 14 13 14   ALKPHOS 43 51 58  BILITOT 0.5 0.5 0.3  PROT 6.5 6.6 6.8  ALBUMIN 2.2* 2.2* 2.4*    CBG: No results for input(s): "GLUCAP" in the last 168 hours.   Recent Results (from the past 240 hour(s))  Culture, blood (Routine X 2) w Reflex to ID Panel     Status: None   Collection Time: 08/15/21  3:11 PM   Specimen: BLOOD LEFT HAND  Result Value Ref Range Status   Specimen Description BLOOD LEFT HAND  Final   Special Requests   Final    BOTTLES DRAWN AEROBIC AND ANAEROBIC Blood Culture adequate volume   Culture   Final    NO GROWTH 5 DAYS Performed at [facility], [address], [place], [facility] [address]    Report Status 08/20/2021 FINAL  Final  Culture, blood (Routine X 2) w Reflex to ID Panel     Status: None   Collection Time: 08/15/21  3:12 PM   Specimen: BLOOD  Result Value Ref Range Status   Specimen Description BLOOD RIGHT ANTECUBITAL  Final   Special Requests   Final    BOTTLES DRAWN AEROBIC AND ANAEROBIC Blood Culture adequate volume   Culture   Final    NO GROWTH 5 DAYS Performed at [facility], [address] [place], [facility] [address]    Report Status 08/20/2021 FINAL  Final         Radiology Studies: DG CHEST PORT 1 VIEW  Result Date: 08/22/2021 CLINICAL DATA:  Cardiac arrest and fever EXAM: PORTABLE CHEST 1 VIEW COMPARISON:  Chest x-ray dated August 19, 2021  FINDINGS: Tracheostomy tube in place. Cardiac and mediastinal contours are unchanged. Low lung volumes. Mild bibasilar opacities, likely due to atelectasis. The visualized skeletal structures are unremarkable. IMPRESSION: Mild bibasilar opacities, likely due to atelectasis. Electronically Signed   By: Allegra Lai M.D.   On: 08/22/2021 08:47        Scheduled Meds:  apixaban  5 mg Cherry Tube BID   atropine  2 drop Sublingual QID   carvedilol  25 mg Cherry Tube BID    chlorhexidine  15 mL Mouth/Throat BID   clonazepam  0.5 mg Cherry Tube TID   doxazosin  2 mg Cherry Tube Daily   feeding supplement (OSMOLITE 1.5 CAL)  355 mL Cherry Tube QID   feeding supplement (PROSource TF)  45 mL Cherry Tube TID   fiber  1 packet Cherry Tube BID   free water  200 mL Cherry Tube Q6H   Gerhardt's butt cream   Topical Daily   glycopyrrolate  1 mg Cherry Tube TID   hydrALAZINE  100 mg Cherry Tube Q8H   isosorbide dinitrate  30 mg Cherry Tube TID   levETIRAcetam  750 mg Cherry Tube BID   mouth rinse  15 mL Mouth Rinse q12n4p   nutrition supplement (JUVEN)  1 packet Cherry Tube BID BM   pantoprazole sodium  40 mg Cherry Tube QHS   polyethylene glycol  17 g Cherry Tube Daily   scopolamine  1 patch Transdermal Q72H   spironolactone  25 mg Cherry Tube Daily   valproic acid  750 mg Cherry Tube TID   Continuous Infusions:     LOS: 145 days    Time spent: over 30 min    Lacretia Nicks, MD [facility]   To contact the attending provider between 7A-7P or the covering provider during after hours 7P-7A, please log into the web site [email or web address] and access using universal [place] password for that web site. If you do not have the password, please call the hospital operator.  08/22/2021, 7:37 PM

## 2021-08-22 NOTE — Progress Notes (Addendum)
Nutrition Follow-up  DOCUMENTATION CODES:   Not applicable  INTERVENTION:   Continue tube feeding via PEG:  1.5 cartons of Osmolite 1.5 - QID (355 mL each bolus) 45 mL ProSource TF given TID 200 mL free water every 6 hours  Provides 2250 kcal, 122 gm PRO, and 1086 mL free water (1886 mL total free water) daily.    Continue Juven (or equivalent) BID per tube, each packet provides 80 calories, 8 grams of carbohydrate, 2.5 grams of protein to promote wound healing  -No BM documented since 08/14/21; reached out to RN and MD via secure chat regarding potential for bowel regimen; MD reports miralax will be scheduled  NUTRITION DIAGNOSIS:   Inadequate oral intake related to inability to eat as evidenced by NPO status.  Ongoing  GOAL:   Patient will meet greater than or equal to 90% of their needs  Met with TF  MONITOR:   Labs, Weight trends, TF tolerance, Skin, I & O's  REASON FOR ASSESSMENT:   Ventilator, Consult Enteral/tube feeding initiation and management  ASSESSMENT:   51 year old male who originally presented to [facility] on 1/19 after out-of-hospital cardiac arrest due to suspected angioedema from ACE inhibitor leading to asphyxiation. PMH of HTN, HLD.  2/11 - tracheostomy placed 2/22 - PEG placed   Reviewed I/O's: -145 ml x 24 hours and -9.7 L since 08/08/21  UOP: 700 ml x 24 hours   TF resumed on 08/16/21. Per RN notes, pt with strong gag reflex and able to manage his own secretions.   Per TOC notes, awaiting for insurance authorization for bed placement. Pt wife is now agreeable to SNF.   Per flowsheets, no BM documented since 08/14/21. Reached out to MD and RN via secure chat regarding possible bowel regimen.   Reviewed wt hx; wt has been stable over the past month.   Medications reviewed and include keppra and aldactone.   Labs reviewed: CBGS: 98.   Diet Order:   Diet Order     None       EDUCATION NEEDS:   Not appropriate for education at this  time  Skin:  Skin Assessment: Skin Integrity Issues: Skin Integrity Issues:: Stage I DTI: N/A Stage I: rt heel, rt upper posterior foot, Stage II: N/A Stage III: N/A Unstageable: N/A Other: MASD to bilateral buttocks  Last BM:  08/14/21  Height:   Ht Readings from Last 1 Encounters:  04/18/21 _0  (1.778 m)    Weight:   Wt Readings from Last 1 Encounters:  08/22/21 97 kg    Ideal Body Weight:  75.5 kg  BMI:  Body mass index is 30.68 kg/m.  Estimated Nutritional Needs:   Kcal:  2200-2400  Protein:  110-125 grams  Fluid:  >/= 2.2 L    Loistine Chance, RD, LDN, [place] Registered Dietitian II Certified Diabetes Care and Education Specialist Please refer to [facility] for RD and/or RD on-call/weekend/after hours pager

## 2021-08-23 LAB — BLOOD CULTURE ID PANEL (REFLEXED) - BCID2

## 2021-08-23 NOTE — Progress Notes (Signed)
PHARMACY - PHYSICIAN COMMUNICATION CRITICAL VALUE ALERT - BLOOD CULTURE IDENTIFICATION (BCID)  Billy Cherry is an 51 y.o. male who presented to [facility] on 03/30/2021 with a chief complaint of cardiac arrest  Assessment:  Last temp 98.6, WBC 10.4, PCT negative  Name of physician (or Provider) Contacted: Dr. Arlean Hopping   Current antibiotics: None  Changes to prescribed antibiotics recommended:  Cont to monitor off anti-biotics   Results for orders placed or performed during the hospital encounter of 03/30/21  Blood Culture ID Panel (Reflexed) (Collected: 08/22/2021  4:36 AM)  Result Value Ref Range   Enterococcus faecalis NOT DETECTED NOT DETECTED   Enterococcus Faecium NOT DETECTED NOT DETECTED   Listeria monocytogenes NOT DETECTED NOT DETECTED   Staphylococcus species DETECTED (A) NOT DETECTED   Staphylococcus aureus (BCID) NOT DETECTED NOT DETECTED   Staphylococcus epidermidis NOT DETECTED NOT DETECTED   Staphylococcus lugdunensis NOT DETECTED NOT DETECTED   Streptococcus species NOT DETECTED NOT DETECTED   Streptococcus agalactiae NOT DETECTED NOT DETECTED   Streptococcus pneumoniae NOT DETECTED NOT DETECTED   Streptococcus pyogenes NOT DETECTED NOT DETECTED   A.calcoaceticus-baumannii NOT DETECTED NOT DETECTED   Bacteroides fragilis NOT DETECTED NOT DETECTED   Enterobacterales NOT DETECTED NOT DETECTED   Enterobacter cloacae complex NOT DETECTED NOT DETECTED   Escherichia coli NOT DETECTED NOT DETECTED   Klebsiella aerogenes NOT DETECTED NOT DETECTED   Klebsiella oxytoca NOT DETECTED NOT DETECTED   Klebsiella pneumoniae NOT DETECTED NOT DETECTED   Proteus species NOT DETECTED NOT DETECTED   Salmonella species NOT DETECTED NOT DETECTED   Serratia marcescens NOT DETECTED NOT DETECTED   Haemophilus influenzae NOT DETECTED NOT DETECTED   Neisseria meningitidis NOT DETECTED NOT DETECTED   Pseudomonas aeruginosa NOT DETECTED NOT DETECTED   Stenotrophomonas maltophilia NOT  DETECTED NOT DETECTED   Candida albicans NOT DETECTED NOT DETECTED   Candida auris NOT DETECTED NOT DETECTED   Candida glabrata NOT DETECTED NOT DETECTED   Candida krusei NOT DETECTED NOT DETECTED   Candida parapsilosis NOT DETECTED NOT DETECTED   Candida tropicalis NOT DETECTED NOT DETECTED   Cryptococcus neoformans/gattii NOT DETECTED NOT DETECTED    [name] 08/23/2021  5:25 AM

## 2021-08-23 NOTE — Progress Notes (Signed)
PROGRESS NOTE    Billy Cherry  FAO:[id number] DOB: 06-12-1970 DOA: 03/30/2021 PCP: Patient, No Pcp Per   Brief Narrative: Patient is a 51 years old male with past medical history of hypertension who was brought into the hospital after being found unresponsive by his wife.  Patient was initially admitted to [facility] hospital for out of hospital cardiac arrest and ventricular fibrillation with respiratory arrest suspected from angioedema from ACE inhibitor leading to asphyxiation.  Patient was then subsequently transferred to [facility].  Below is the Sequence of events during hospitalization,  1/19 left heart cath without significant CAD.  Concern for anoxic brain injury 1/21: MRI shows areas of hypoxic-ischemic injury bilaterally. Fevered; cultures obtained. 1/23 severe brain damage 1/25: PEG . Neuro exam unchanged.  Does exhibit new eyelid tremor like movements and lip/jaw tremor like vs clonus movement.  Bolused Keppra and increased maintenance dose.  Repeat EEG. CTH: concerning for progression of anoxic injury.  1/26: Repeat EEG unchanged and continues to show myoclonic seizures despite addition of Valproic acid yesterday.  03/30/2021 Transfer to [facility] from [place] for continuous EEG 1/28: Continues to have myoclonic sz on EEG 2/3 no meaningful neuro recovery. Diuresed 2/6 New fever to 102, WBC 16K - treated with vancomycin for 7d based on cultures.  2/11: percutaneous tracheostomy 2/13 >2/20 on trach collar. No vent.  2/20 Trach change to 6 cuffless 2/28: fever 3/13: eliquis started 4/3: fever 06/06/2021 CT of the chest with new PE and haziness consistent with pneumonia 4/5-4/9: responding well to PRN lasix 4/11 fever again after completion of abx- work up in progress 4/13 continues to have low-grade temps cultures from 4/11 no growth to date-suspect silent/occult aspiration-heparin changed back to Eliquis 4/13 4/14: Work-up negative with no growth to date but red  menance Tmax elevated 99.4 and respiratory rate up-x-ray did not show         any pneumonia but probably silently aspirating 08/04/21- ongoing.  Patient remains unresponsive.  Awaiting for placement and disposition has been difficult 6/20 -> difficult placement team working on [facility], [facility]?   Assessment and Plan:  S/p Cardiac arrest Postarrest anoxic encephalopathy Postarrest respiratory failure s/p tracheostomy S/P PEG tube placement 2D echocardiogram showed reduced LV function at <20% with global hypokinesis.  Now status post trach and PEG.  Remains obtunded with anoxic encephalopathy. -Continue Keppra  Anoxic encephalopathy ([name]) MRI brain significant for hypoxic brain injury. Continue supportive care. Patient currently keeps his eyes open, but does not answer questions or follow commands. Unchanged neurologic status   Acute respiratory failure with hypoxia Status post trach collar. Continue supportive care. Now chronic.   RLL pneumonia Previously had completed vancomycin for MSSA and [name] in early march.  Received more than 7-day course of IV Fortaz. On 4/3, CT chest was ordered that indicated an area of alveolar infiltration that may represent pneumonia.  Tracheal aspirate has been sent for culture and is significant for normal respiratory flora. Concerning for aspiration. Completed course of Zosyn. Patient with worsening secretions. Chest x-ray on 6/11 concerning for possible pneumonia. Patient retreated for pneumonia with Cefepime x7 days.  Elevated temperature Tmax of 100 F on 6/19. No clear source, although patient has had multiple episodes of aspiration pneumonia. Blood cultures (6/20) obtained and are significant for 1/4 bottles positive for staphylococcus species. -Repeat blood cultures -Hold off on antibiotics for now   DVT (deep venous thrombosis) ([name]) Duplex ultrasound of lower extremity on 05/15/2021 showed age indeterminate deep vein thrombosis  involving the left femoral vein, left proximal profunda vein, left posterior tibial veins, left popliteal vein, and left peroneal veins. -Continue Eliquis   Dysphagia On bolus feeds per dietitian -Aspiration precautions -Continue tube feeds  Emesis Unsure of etiology. Tube feeds held and eventually resumed. Abdominal x-ray without clear etiology. Resolved.   Pulmonary embolus ([name]) Noted to have possible acute on chronic pulmonary embolism on CT.  Also have some evidence of possible pulmonary hypertension.  Patient was previously managed on Eliquis which is transitioned to Lovenox -Continue Eliquis   Pressure injury of skin Lateral penis, posterior right heel, right foot. Not present on admission.   HTN (hypertension) Blood pressure continues to be elevated. -Continue Coreg, Cardura, isosorbide dinitrate and hydralazine -PRN hydralazine   HFrEF (heart failure with reduced ejection fraction) ([name]) 2D echocardiogram-LVEF < 20%,  -Continue coreg, imdur, spironolactone   Myoclonus Seen by neurology. Per neuro notes, suppressing his myoclonus will not ultimately change the prognosis. -Continue Keppra, Klonopin   Hypernatremia-resolved Continue free water flushes  DVT prophylaxis: Lovenox Code Status:   Code Status: Full Code Family Communication: None at bedside Disposition Plan: Discharge home pending insurance, home health/services availability vs SNF   Consultants:  Infectious disease Palliative care Neurology Cardiology Critical care  Procedures:  Tracheostomy PEG tube  Antimicrobials: Unasyn Cefepime Ceftazidime Vancomycin Cefazolin   Subjective: Non verbal  Objective: BP (!) 134/105   Pulse 91   Temp 98 F (36.7 C) (Axillary)   Resp 18   Ht 5\' 10"  (1.778 m)   Wt 96 kg   SpO2 99%   BMI 30.37 kg/m   Examination:  General exam: Appears calm and comfortable Respiratory system: Clear to auscultation. Respiratory effort normal. Cardiovascular  system: S1 & S2 heard, RRR. No murmurs, rubs, gallops or clicks. Gastrointestinal system: Abdomen is nondistended, soft and nontender. Normal bowel sounds heard. Central nervous system: Opens eyes spontaneously, otherwise no purposeful response to external stimuli Musculoskeletal: No edema. No calf tenderness   Data Reviewed: I have personally reviewed following labs and imaging studies  CBC Lab Results  Component Value Date   WBC 10.4 08/22/2021   RBC 4.09 (L) 08/22/2021   HGB 12.4 (L) 08/22/2021   HCT 38.7 (L) 08/22/2021   MCV 94.6 08/22/2021   MCH 30.3 08/22/2021   PLT 263 08/22/2021   MCHC 32.0 08/22/2021   RDW 15.0 08/22/2021   LYMPHSABS 1.6 08/22/2021   MONOABS 1.5 (H) 08/22/2021   EOSABS 0.1 08/22/2021   BASOSABS 0.0 08/22/2021     Last metabolic panel Lab Results  Component Value Date   NA 140 08/21/2021   K 4.4 08/21/2021   CL 103 08/21/2021   CO2 30 08/21/2021   BUN 42 (H) 08/21/2021   CREATININE 0.64 08/21/2021   GLUCOSE 147 (H) 08/21/2021   GFRNONAA >60 08/21/2021   CALCIUM 9.4 08/21/2021   PHOS 3.2 08/21/2021   PROT 6.8 08/21/2021   ALBUMIN 2.4 (L) 08/21/2021   BILITOT 0.3 08/21/2021   ALKPHOS 58 08/21/2021   AST 18 08/21/2021   ALT 14 08/21/2021   ANIONGAP 7 08/21/2021    GFR: Estimated Creatinine Clearance: 128.4 mL/min (by C-G formula based on SCr of 0.64 mg/dL).   Radiology Studies: DG CHEST PORT 1 VIEW  Result Date: 08/22/2021 CLINICAL DATA:  Cardiac arrest and fever EXAM: PORTABLE CHEST 1 VIEW COMPARISON:  Chest x-ray dated August 19, 2021 FINDINGS: Tracheostomy tube in place. Cardiac and mediastinal contours are unchanged. Low lung volumes. Mild bibasilar opacities, likely due to atelectasis.  The visualized skeletal structures are unremarkable. IMPRESSION: Mild bibasilar opacities, likely due to atelectasis. Electronically Signed   By: Yetta Glassman M.D.   On: 08/22/2021 08:47      LOS: 146 days    Cordelia Poche, MD [facility] 08/23/2021, 6:06 PM   If 7PM-7AM, please contact night-coverage [email or web address]

## 2021-08-23 NOTE — Plan of Care (Signed)
?  Problem: Clinical Measurements: ?Goal: Will remain free from infection ?Outcome: Progressing ?  ?

## 2021-08-24 ENCOUNTER — Inpatient Hospital Stay (HOSPITAL_COMMUNITY): Payer: BC Managed Care – PPO

## 2021-08-24 NOTE — Progress Notes (Signed)
VAST consulted to discontinue current PIV which has been in place for 30 days and place new PIV. Old site in right lateral forearm dc'd; no redness, drainage, or swelling noted. Attempted to place new PIV without success. Utilized ultrasound to visualize patient's vasculature. Vessels too small for PIV placement. Only vessel large enough for USGIV is same vessel where previous IV was located. Advised unit nurse of recommendation to leave patient without IV at this time as he does not have any IV medications ordered. After resting right arm for 24 hours and IF patient needs IV access, same vessel may be cannulated for access. If emergent access is needed IO access can be attempted.

## 2021-08-24 NOTE — TOC Progression Note (Signed)
Transition of Care [facility]) - Progression Note    Patient Details  Name: Billy Cherry MRN: [id number] Date of Birth: 12-Jan-1971  Transition of [facility]) CM/SW Contact  Janae Bridgeman, RN Phone Number: 08/24/2021, 8:00 AM  Clinical Narrative:    CM received call from Jess Barters, Director with [employer] and he states that [employer] is continuing to recruit private duty staffing for the home, but currently  do not have coverage at this time.  Maxim plans to co-vend with another agency is unable to establish staffing for the home.  Maxim states that due to nursing shortages in the community - private duty staffing in the area has been challenging.  [facility] does not have bed availability at this time for backup plan for admission to the facility while [facility] is recruiting for private duty.  CM and MSW with DTP Team will continue to follow the patient for TOC needs.   Expected Discharge Plan: Home w Home Health Services Barriers to Discharge: Other (must enter comment) (Patient's wife - requested private duty services through alternative provider for 12 hours shifts for staffing at the home.)  Expected Discharge Plan and Services Expected Discharge Plan: Home w Home Health Services In-house Referral: Clinical Social Work Discharge Planning Services: CM Consult, Follow-up appt scheduled Post Acute Care Choice: Home Health Living arrangements for the past 2 months: Single Family Home Expected Discharge Date: 08/07/21               DME Arranged: Janina Mayo supplies, Tube feeding, Suction, Hospital bed, Oxygen DME Agency: [employer]       HH Arranged: RN (Private Duty Nursing through [employer] versus [place]) HH Agency: [employer] Services Date HH Agency Contacted: 07/04/21 Time HH Agency Contacted: 1000 Representative spoke with at [facility] Agency: Jeanmarie Plant, Director with [employer] [phone]   Social Determinants of Health (SDOH)  Interventions    Readmission Risk Interventions    04/14/2021   11:14 AM  Readmission Risk Prevention Plan  Transportation Screening Complete  HRI or Home Care Consult Complete  Social Work Consult for Recovery Care Planning/Counseling Complete  Palliative Care Screening Complete  Medication Review [employer]) Complete

## 2021-08-24 NOTE — Progress Notes (Signed)
PROGRESS NOTE    Billy Cherry  CBJ:[id number] DOB: 10-09-1970 DOA: 03/30/2021 PCP: Patient, No Pcp Per   Brief Narrative: Patient is a 51 years old male with past medical history of hypertension who was brought into the hospital after being found unresponsive by his wife.  Patient was initially admitted to [facility] hospital for out of hospital cardiac arrest and ventricular fibrillation with respiratory arrest suspected from angioedema from ACE inhibitor leading to asphyxiation.  Patient was then subsequently transferred to [facility].  Below is the Sequence of events during hospitalization,  1/19 left heart cath without significant CAD.  Concern for anoxic brain injury 1/21: MRI shows areas of hypoxic-ischemic injury bilaterally. Fevered; cultures obtained. 1/23 severe brain damage 1/25: PEG . Neuro exam unchanged.  Does exhibit new eyelid tremor like movements and lip/jaw tremor like vs clonus movement.  Bolused Keppra and increased maintenance dose.  Repeat EEG. CTH: concerning for progression of anoxic injury.  1/26: Repeat EEG unchanged and continues to show myoclonic seizures despite addition of Valproic acid yesterday.  03/30/2021 Transfer to [facility] from [place] for continuous EEG 1/28: Continues to have myoclonic sz on EEG 2/3 no meaningful neuro recovery. Diuresed 2/6 New fever to 102, WBC 16K - treated with vancomycin for 7d based on cultures.  2/11: percutaneous tracheostomy 2/13 >2/20 on trach collar. No vent.  2/20 Trach change to 6 cuffless 2/28: fever 3/13: eliquis started 4/3: fever 06/06/2021 CT of the chest with new PE and haziness consistent with pneumonia 4/5-4/9: responding well to PRN lasix 4/11 fever again after completion of abx- work up in progress 4/13 continues to have low-grade temps cultures from 4/11 no growth to date-suspect silent/occult aspiration-heparin changed back to Eliquis 4/13 4/14: Work-up negative with no growth to date but red  menance Tmax elevated 99.4 and respiratory rate up-x-ray did not show         any pneumonia but probably silently aspirating 08/04/21- ongoing.  Patient remains unresponsive.  Awaiting for placement and disposition has been difficult 6/20 -> difficult placement team working on options, [facility]?   Assessment and Plan:  S/p Cardiac arrest Postarrest anoxic encephalopathy Postarrest respiratory failure s/p tracheostomy S/P PEG tube placement 2D echocardiogram showed reduced LV function at <20% with global hypokinesis.  Now status post trach and PEG.  Remains obtunded with anoxic encephalopathy. -Continue Keppra  Anoxic encephalopathy ([name]) MRI brain significant for hypoxic brain injury. Continue supportive care. Patient currently keeps his eyes open, but does not answer questions or follow commands. Unchanged neurologic status   Acute respiratory failure with hypoxia Status post trach collar. Continue supportive care. Now chronic.   RLL pneumonia Previously had completed vancomycin for MSSA and [name] in early march.  Received more than 7-day course of IV Fortaz. On 4/3, CT chest was ordered that indicated an area of alveolar infiltration that may represent pneumonia.  Tracheal aspirate has been sent for culture and is significant for normal respiratory flora. Concerning for aspiration. Completed course of Zosyn. Patient with worsening secretions. Chest x-ray on 6/11 concerning for possible pneumonia. Patient retreated for pneumonia with Cefepime x7 days. Patient has developed worsening secretions. CT chest negative for recurrent pneumonia.  Positive blood cultures Tmax of 100 F on 6/19. No clear source, although patient has had multiple episodes of aspiration pneumonia. Blood cultures (6/20) obtained and are positive for staphylococcus capitis. Repeat blood cultures (6/20) preliminarily positive for GPC in clusters. Discussed with ID and will await repeat blood culture  species  identification. -Hold off on antibiotics for now -Change peripheral IV   DVT (deep venous thrombosis) ([name]) Duplex ultrasound of lower extremity on 05/15/2021 showed age indeterminate deep vein thrombosis involving the left femoral vein, left proximal profunda vein, left posterior tibial veins, left popliteal vein, and left peroneal veins. -Continue Eliquis   Dysphagia On bolus feeds per dietitian -Aspiration precautions -Continue tube feeds  Emesis Unsure of etiology. Tube feeds held and eventually resumed. Abdominal x-ray without clear etiology. Obtained CT abdomen/pelvis which did not identify an etiology for symptoms. -Zofran PRN   Pulmonary embolus ([name]) Noted to have possible acute on chronic pulmonary embolism on CT.  Also have some evidence of possible pulmonary hypertension.  Patient was previously managed on Eliquis which is transitioned to Lovenox -Continue Eliquis   Pressure injury of skin Lateral penis, posterior right heel, right foot. Not present on admission.   HTN (hypertension) Blood pressure continues to be elevated. -Continue Coreg, Cardura, isosorbide dinitrate and hydralazine -PRN hydralazine   HFrEF (heart failure with reduced ejection fraction) ([name]) 2D echocardiogram-LVEF < 20%,  -Continue coreg, imdur, spironolactone   Myoclonus Seen by neurology. Per neuro notes, suppressing his myoclonus will not ultimately change the prognosis. -Continue Keppra, Klonopin   Hypernatremia-resolved Continue free water flushes  DVT prophylaxis: Lovenox Code Status:   Code Status: Full Code Family Communication: None at bedside Disposition Plan: Discharge home pending insurance, home health/services availability vs SNF   Consultants:  Infectious disease Palliative care Neurology Cardiology Critical care  Procedures:  Tracheostomy PEG  tube  Antimicrobials: Unasyn Cefepime Ceftazidime Vancomycin Cefazolin   Subjective: Non-verbal.  Objective: BP (!) 106/91 (BP Location: Left Arm)   Pulse (!) 102   Temp 98 F (36.7 C) (Axillary)   Resp (P) 18   Ht 5\' 10"  (1.778 m)   Wt 96 kg   SpO2 98%   BMI 30.37 kg/m   Examination:  General exam: Appears calm and comfortable Respiratory system: Diminished with mild rhonchi. Respiratory effort normal. Cardiovascular system: S1 & S2 heard, RRR. No murmurs, rubs, gallops or clicks. Gastrointestinal system: Abdomen is nondistended, soft and nontender. Normal bowel sounds heard. Musculoskeletal: No calf tenderness   Data Reviewed: I have personally reviewed following labs and imaging studies  CBC Lab Results  Component Value Date   WBC 10.4 08/22/2021   RBC 4.09 (L) 08/22/2021   HGB 12.4 (L) 08/22/2021   HCT 38.7 (L) 08/22/2021   MCV 94.6 08/22/2021   MCH 30.3 08/22/2021   PLT 263 08/22/2021   MCHC 32.0 08/22/2021   RDW 15.0 08/22/2021   LYMPHSABS 1.6 08/22/2021   MONOABS 1.5 (H) 08/22/2021   EOSABS 0.1 08/22/2021   BASOSABS 0.0 [id number]     Last metabolic panel Lab Results  Component Value Date   NA 140 08/21/2021   K 4.4 08/21/2021   CL 103 08/21/2021   CO2 30 08/21/2021   BUN 42 (H) 08/21/2021   CREATININE 0.64 08/21/2021   GLUCOSE 147 (H) 08/21/2021   GFRNONAA >60 08/21/2021   CALCIUM 9.4 08/21/2021   PHOS 3.2 08/21/2021   PROT 6.8 08/21/2021   ALBUMIN 2.4 (L) 08/21/2021   BILITOT 0.3 08/21/2021   ALKPHOS 58 08/21/2021   AST 18 08/21/2021   ALT 14 08/21/2021   ANIONGAP 7 08/21/2021    GFR: Estimated Creatinine Clearance: 128.4 mL/min (by C-G formula based on SCr of 0.64 mg/dL).   Radiology Studies: No results found.    LOS: 147 days    Cordelia Poche, MD [facility]  08/24/2021, 9:58 AM   If 7PM-7AM, please contact night-coverage [email or web address]

## 2021-08-24 NOTE — Progress Notes (Signed)
PHARMACY - PHYSICIAN COMMUNICATION CRITICAL VALUE ALERT - BLOOD CULTURE IDENTIFICATION (BCID)  Billy Cherry is an 51 y.o. male who presented to [facility] on 03/30/2021 with a cardiac arrest - with prolonged hospital stay  Assessment:  2 YOM with low-grade fever on 6/20 PM with blood cultures sent, 1 of 4 blood cultures on 6/20 with GPC and BCID detecting staph species. Repeat blood cultures ordered 6/21 - but only two bottles drawn (pediatric bottles) with 1/2 growing GPC, micro will not re-run BCID. Hard to tell what to make of these cultures given the patient's prolonged hospital stay - noted peripheral in place for ~1 month, so consideration can be made for removal/replacement of this. Still high likelihood of just contamination.   Name of physician (or Provider) ContactedCaleb Popp  Current antibiotics: None  Changes to prescribed antibiotics recommended:  None - would recommend monitoring off antibiotics unless the patient has additional fevers and/or concern for infection. Consider remove/replace of PIV site.  Results for orders placed or performed during the hospital encounter of 03/30/21  Blood Culture ID Panel (Reflexed) (Collected: 08/22/2021  4:36 AM)  Result Value Ref Range   Enterococcus faecalis NOT DETECTED NOT DETECTED   Enterococcus Faecium NOT DETECTED NOT DETECTED   Listeria monocytogenes NOT DETECTED NOT DETECTED   Staphylococcus species DETECTED (A) NOT DETECTED   Staphylococcus aureus (BCID) NOT DETECTED NOT DETECTED   Staphylococcus epidermidis NOT DETECTED NOT DETECTED   Staphylococcus lugdunensis NOT DETECTED NOT DETECTED   Streptococcus species NOT DETECTED NOT DETECTED   Streptococcus agalactiae NOT DETECTED NOT DETECTED   Streptococcus pneumoniae NOT DETECTED NOT DETECTED   Streptococcus pyogenes NOT DETECTED NOT DETECTED   A.calcoaceticus-baumannii NOT DETECTED NOT DETECTED   Bacteroides fragilis NOT DETECTED NOT DETECTED   Enterobacterales NOT DETECTED  NOT DETECTED   Enterobacter cloacae complex NOT DETECTED NOT DETECTED   Escherichia coli NOT DETECTED NOT DETECTED   Klebsiella aerogenes NOT DETECTED NOT DETECTED   Klebsiella oxytoca NOT DETECTED NOT DETECTED   Klebsiella pneumoniae NOT DETECTED NOT DETECTED   Proteus species NOT DETECTED NOT DETECTED   Salmonella species NOT DETECTED NOT DETECTED   Serratia marcescens NOT DETECTED NOT DETECTED   Haemophilus influenzae NOT DETECTED NOT DETECTED   Neisseria meningitidis NOT DETECTED NOT DETECTED   Pseudomonas aeruginosa NOT DETECTED NOT DETECTED   Stenotrophomonas maltophilia NOT DETECTED NOT DETECTED   Candida albicans NOT DETECTED NOT DETECTED   Candida auris NOT DETECTED NOT DETECTED   Candida glabrata NOT DETECTED NOT DETECTED   Candida krusei NOT DETECTED NOT DETECTED   Candida parapsilosis NOT DETECTED NOT DETECTED   Candida tropicalis NOT DETECTED NOT DETECTED   Cryptococcus neoformans/gattii NOT DETECTED NOT DETECTED    Thank you for allowing pharmacy to be a part of this patient's care.  Georgina Pillion, PharmD, BCPS Infectious Diseases Clinical Pharmacist 08/24/2021 9:34 AM   **Pharmacist phone directory can now be found on [email or web address] ([email or web address]).  Listed under [facility].

## 2021-08-25 LAB — CULTURE, BLOOD (ROUTINE X 2): Special Requests: ADEQUATE

## 2021-08-25 NOTE — Progress Notes (Signed)
PROGRESS NOTE    Billy Cherry  WUJ:[id number] DOB: [birth year] DOA: 03/30/2021 PCP: Patient, No Pcp Per   Brief Narrative: Patient is a 51 years old male with past medical history of hypertension who was brought into the hospital after being found unresponsive by his wife.  Patient was initially admitted to [facility] hospital for out of hospital cardiac arrest and ventricular fibrillation with respiratory arrest suspected from angioedema from ACE inhibitor leading to asphyxiation.  Patient was then subsequently transferred to [facility].  Below is the Sequence of events during hospitalization,  1/19 left heart cath without significant CAD.  Concern for anoxic brain injury 1/21: MRI shows areas of hypoxic-ischemic injury bilaterally. Fevered; cultures obtained. 1/23 severe brain damage 1/25: PEG . Neuro exam unchanged.  Does exhibit new eyelid tremor like movements and lip/jaw tremor like vs clonus movement.  Bolused Keppra and increased maintenance dose.  Repeat EEG. CTH: concerning for progression of anoxic injury.  1/26: Repeat EEG unchanged and continues to show myoclonic seizures despite addition of Valproic acid yesterday.  03/30/2021 Transfer to [facility] from [facility] for continuous EEG 1/28: Continues to have myoclonic sz on EEG 2/3 no meaningful neuro recovery. Diuresed 2/6 New fever to 102, WBC 16K - treated with vancomycin for 7d based on cultures.  2/11: percutaneous tracheostomy 2/13 >2/20 on trach collar. No vent.  2/20 Trach change to 6 cuffless 2/28: fever 3/13: eliquis started 4/3: fever 06/06/2021 CT of the chest with new PE and haziness consistent with pneumonia 4/5-4/9: responding well to PRN lasix 4/11 fever again after completion of abx- work up in progress 4/13 continues to have low-grade temps cultures from 4/11 no growth to date-suspect silent/occult aspiration-heparin changed back to Eliquis 4/13 4/14: Work-up negative with no growth to date but red  menance Tmax elevated 99.4 and respiratory rate up-x-ray did not show         any pneumonia but probably silently aspirating 08/04/21- ongoing.  Patient remains unresponsive.  Awaiting for placement and disposition has been difficult 6/20 -> difficult placement team working [facility]?   Assessment and Plan:  S/p Cardiac arrest Postarrest anoxic encephalopathy Postarrest respiratory failure s/p tracheostomy S/P PEG tube placement 2D echocardiogram showed reduced LV function at <20% with global hypokinesis.  Now status post trach and PEG.  Remains obtunded with anoxic encephalopathy. -Continue Keppra  Anoxic encephalopathy ([name]) MRI brain significant for hypoxic brain injury. Continue supportive care. Patient currently keeps his eyes open, but does not answer questions or follow commands. Unchanged neurologic status   Acute respiratory failure with hypoxia Status post trach collar. Continue supportive care. Now chronic.   RLL pneumonia Previously had completed vancomycin for MSSA and [name] in early march.  Received more than 7-day course of IV Fortaz. On 4/3, CT chest was ordered that indicated an area of alveolar infiltration that may represent pneumonia.  Tracheal aspirate has been sent for culture and is significant for normal respiratory flora. Concerning for aspiration. Completed course of Zosyn. Patient with worsening secretions. Chest x-ray on 6/11 concerning for possible pneumonia. Patient retreated for pneumonia with Cefepime x7 days. Patient has developed worsening secretions. CT chest negative for recurrent pneumonia.  Positive blood cultures Tmax of 100 F on 6/19. No clear source, although patient has had multiple episodes of aspiration pneumonia. Blood cultures (6/20) obtained and are positive for staphylococcus capitis. Repeat blood cultures (6/20) preliminarily positive for GPC in clusters. Discussed with ID. Repeat cultures with different species  of staphylococcus.  Likely contaminant. -Hold off on antibiotics for now -Change peripheral IV   DVT (deep venous thrombosis) ([name]) Duplex ultrasound of lower extremity on 05/15/2021 showed age indeterminate deep vein thrombosis involving the left femoral vein, left proximal profunda vein, left posterior tibial veins, left popliteal vein, and left peroneal veins. -Continue Eliquis   Dysphagia On bolus feeds per dietitian -Aspiration precautions -Continue tube feeds  Emesis Unsure of etiology. Tube feeds held and eventually resumed. Abdominal x-ray without clear etiology. Obtained CT abdomen/pelvis which did not identify an etiology for symptoms. -Zofran PRN   Pulmonary embolus ([name]) Noted to have possible acute on chronic pulmonary embolism on CT.  Also have some evidence of possible pulmonary hypertension.  Patient was previously managed on Eliquis which is transitioned to Lovenox -Continue Eliquis   Pressure injury of skin Lateral penis, posterior right heel, right foot. Not present on admission.   HTN (hypertension) Blood pressure continues to be elevated. -Continue Coreg, Cardura, isosorbide dinitrate and hydralazine -PRN hydralazine   HFrEF (heart failure with reduced ejection fraction) ([name]) 2D echocardiogram-LVEF < 20%,  -Continue coreg, imdur, spironolactone   Myoclonus Seen by neurology. Per neuro notes, suppressing his myoclonus will not ultimately change the prognosis. -Continue Keppra, Klonopin   Hypernatremia-resolved Continue free water flushes  DVT prophylaxis: Lovenox Code Status:   Code Status: Full Code Family Communication: None at bedside Disposition Plan: Discharge home pending insurance, home health/services availability vs SNF   Consultants:  Infectious disease Palliative care Neurology Cardiology Critical care  Procedures:  Tracheostomy PEG  tube  Antimicrobials: Unasyn Cefepime Ceftazidime Vancomycin Cefazolin   Subjective: Non-verbal  Objective: BP (!) 121/95   Pulse (!) 104   Temp 98.7 F (37.1 C) (Oral)   Resp 18   Ht 5\' 10"  (1.778 m)   Wt 96 kg   SpO2 96%   BMI 30.37 kg/m   Examination:  General exam: Appears calm and comfortable Respiratory system: Diminished. Respiratory effort normal. Cardiovascular system: S1 & S2 heard, RRR. No murmurs, rubs, gallops or clicks. Gastrointestinal system: Abdomen is nondistended, soft and nontender. Normal bowel sounds heard. Central nervous system: Seems to open eyes slightly to my presence. Does not follow any commands. Musculoskeletal: No edema. No calf tenderness Skin: No cyanosis. No rashes    Data Reviewed: I have personally reviewed following labs and imaging studies  CBC Lab Results  Component Value Date   WBC 10.4 08/22/2021   RBC 4.09 (L) 08/22/2021   HGB 12.4 (L) 08/22/2021   HCT 38.7 (L) 08/22/2021   MCV 94.6 08/22/2021   MCH 30.3 08/22/2021   PLT 263 08/22/2021   MCHC 32.0 08/22/2021   RDW 15.0 08/22/2021   LYMPHSABS 1.6 08/22/2021   MONOABS 1.5 (H) 08/22/2021   EOSABS 0.1 08/22/2021   BASOSABS 0.0 08/22/2021     Last metabolic panel Lab Results  Component Value Date   NA 140 08/21/2021   K 4.4 08/21/2021   CL 103 08/21/2021   CO2 30 08/21/2021   BUN 42 (H) 08/21/2021   CREATININE 0.64 08/21/2021   GLUCOSE 147 (H) 08/21/2021   GFRNONAA >60 08/21/2021   CALCIUM 9.4 08/21/2021   PHOS 3.2 08/21/2021   PROT 6.8 08/21/2021   ALBUMIN 2.4 (L) 08/21/2021   BILITOT 0.3 08/21/2021   ALKPHOS 58 08/21/2021   AST 18 08/21/2021   ALT 14 08/21/2021   ANIONGAP 7 08/21/2021    GFR: Estimated Creatinine Clearance: 128.4 mL/min (by C-G formula based on SCr of 0.64 mg/dL).   Radiology Studies:  CT CHEST ABDOMEN PELVIS WO CONTRAST  Result Date: 08/24/2021 CLINICAL DATA:  Nausea and vomiting with recurrent aspiration. EXAM: CT CHEST,  ABDOMEN AND PELVIS WITHOUT CONTRAST TECHNIQUE: Multidetector CT imaging of the chest, abdomen and pelvis was performed following the standard protocol without IV contrast. RADIATION DOSE REDUCTION: This exam was performed according to the departmental dose-optimization program which includes automated exposure control, adjustment of the mA and/or kV according to patient size and/or use of iterative reconstruction technique. COMPARISON:  Chest x-ray dated August 22, 2021. Abdominal x-ray dated August 15, 2021. CT chest dated June 06, 2021. CT abdomen dated April 14, 2021. FINDINGS: CT CHEST FINDINGS Cardiovascular: No significant vascular findings. Unchanged mild cardiomegaly. No significant pericardial effusion. No thoracic aortic aneurysm. Mediastinum/Nodes: No enlarged mediastinal, hilar, or axillary lymph nodes. Thyroid gland and esophagus demonstrate no significant findings. Lungs/Pleura: Unchanged tracheostomy tube. Bilateral lower lobe and lingular atelectasis. Trace left pleural effusion. No focal consolidation or pneumothorax. Musculoskeletal: No acute or significant osseous findings. Multiple old healed bilateral anterior rib fractures again noted. CT ABDOMEN PELVIS FINDINGS Hepatobiliary: No focal liver abnormality is seen. No gallstones, gallbladder wall thickening, or biliary dilatation. Pancreas: Unremarkable. No pancreatic ductal dilatation or surrounding inflammatory changes. Spleen: Normal in size without focal abnormality. Adrenals/Urinary Tract: Adrenal glands are unremarkable. Unchanged bilateral renal cysts. No follow-up imaging is recommended. No renal calculi or hydronephrosis. The bladder is unremarkable. Stomach/Bowel: Appropriately positioned gastrostomy tube. The stomach is otherwise within normal limits. No bowel wall thickening, distention, or surrounding inflammatory changes. Normal appendix. Vascular/Lymphatic: No significant vascular findings are present. No enlarged abdominal or pelvic  lymph nodes. Reproductive: Mild prostatomegaly. Other: No abdominal wall hernia or abnormality. No abdominopelvic ascites. No free fluid or pneumoperitoneum. Musculoskeletal: No acute or significant osseous findings. IMPRESSION: 1. No acute abnormality in the chest, abdomen, or pelvis. 2. Trace left pleural effusion with bilateral lower lobe and lingular atelectasis. No aspiration pneumonia. Electronically Signed   By: Obie Dredge M.D.   On: 08/24/2021 13:13      LOS: 148 days    Jacquelin Hawking, MD [facility] 08/25/2021, 12:28 PM   If 7PM-7AM, please contact night-coverage [email or web address]

## 2021-08-25 NOTE — TOC Progression Note (Signed)
Transition of Care [facility]) - Progression Note    Patient Details  Name: Billy Cherry MRN: [id number] Date of Birth: 1970/12/26  [facility]) CM/SW Contact  Janae Bridgeman, RN Phone Number: 08/25/2021, 1:28 PM  Clinical Narrative:    CM called and spoke with Jeanmarie Plant, Admissions Director with [facility] and he states that they are hiring a Charity fundraiser for full-time private duty nights that would be available for 3-12 hour shifts and have a current RN that lives in [place], [place] that may be willing to cover 3 days during the week as well.  Tod Persia states that he was following up with the wife to discuss possible private duty availability in the home.    No SNF offers are available at this time - including [facility] unit.  Patient's wife has all dme through [employer] at this time - only need is to order the air mattress for delivery to the home once private duty nursing is available and discharge date can be established based on this staffing availability.  CM and MSW with DTP Team will continue to follow the patient for likely discharge to home once private duty nursing can be provided through [facility] - No pending discharge date at this time.   Expected Discharge Plan: Home w Home Health Services Barriers to Discharge: Other (must enter comment) (Patient's wife - requested private duty services through alternative provider for 12 hours shifts for staffing at the home.)  Expected Discharge Plan and Services Expected Discharge Plan: Home w Home Health Services In-house Referral: Clinical Social Work Discharge Planning Services: CM Consult, Follow-up appt scheduled Post Acute Care Choice: Home Health Living arrangements for the past 2 months: Single Family Home Expected Discharge Date: 08/07/21               DME Arranged: Janina Mayo supplies, Tube feeding, Suction, Hospital bed, Oxygen DME Agency: [employer]       HH Arranged: RN (Private Duty Nursing through  [employer] versus [facility]) HH Agency: [employer] [facility] Date HH Agency Contacted: 07/04/21 Time HH Agency Contacted: 1000 Representative spoke with at [facility] Agency: Jeanmarie Plant, Director with [employer] [phone]   Social Determinants of Health (SDOH) Interventions    Readmission Risk Interventions    04/14/2021   11:14 AM  Readmission Risk Prevention Plan  Transportation Screening Complete  HRI or Home Care Consult Complete  Social Work Consult for Recovery Care Planning/Counseling Complete  Palliative Care Screening Complete  Medication Review Oceanographer) Complete

## 2021-08-27 LAB — CULTURE, BLOOD (ROUTINE X 2): Culture: NO GROWTH

## 2021-08-28 LAB — URINALYSIS, COMPLETE (UACMP) WITH MICROSCOPIC
Bacteria, UA: NONE SEEN
Bilirubin Urine: NEGATIVE
Glucose, UA: NEGATIVE mg/dL
Hgb urine dipstick: NEGATIVE
Ketones, ur: 5 mg/dL — AB
Leukocytes,Ua: NEGATIVE
Nitrite: NEGATIVE
Protein, ur: NEGATIVE mg/dL
Specific Gravity, Urine: 1.02 (ref 1.005–1.030)
pH: 6 (ref 5.0–8.0)

## 2021-08-28 LAB — CULTURE, BLOOD (ROUTINE X 2)
Culture: NO GROWTH
Special Requests: ADEQUATE

## 2021-08-28 NOTE — Progress Notes (Signed)
RT NOTE: Per CCM Dr. Francine Graven patient's trach to be changed every 4 weeks. Janina Mayo now due to be changed July 10th, 2023.

## 2021-08-28 NOTE — Progress Notes (Signed)
PROGRESS NOTE    Billy Cherry  YQM:[id number] DOB: 1970-05-24 DOA: 03/30/2021 PCP: Patient, No Pcp Per   Brief Narrative: Patient is a 51 years old male with past medical history of hypertension who was brought into the hospital after being found unresponsive by his wife.  Patient was initially admitted to [facility] hospital for out of hospital cardiac arrest and ventricular fibrillation with respiratory arrest suspected from angioedema from ACE inhibitor leading to asphyxiation.  Patient was then subsequently transferred to [facility].  Below is the Sequence of events during hospitalization,  1/19 left heart cath without significant CAD.  Concern for anoxic brain injury 1/21: MRI shows areas of hypoxic-ischemic injury bilaterally. Fevered; cultures obtained. 1/23 severe brain damage 1/25: PEG . Neuro exam unchanged.  Does exhibit new eyelid tremor like movements and lip/jaw tremor like vs clonus movement.  Bolused Keppra and increased maintenance dose.  Repeat EEG. CTH: concerning for progression of anoxic injury.  1/26: Repeat EEG unchanged and continues to show myoclonic seizures despite addition of Valproic acid yesterday.  03/30/2021 Transfer to [facility] from [place] for continuous EEG 1/28: Continues to have myoclonic sz on EEG 2/3 no meaningful neuro recovery. Diuresed 2/6 New fever to 102, WBC 16K - treated with vancomycin for 7d based on cultures.  2/11: percutaneous tracheostomy 2/13 >2/20 on trach collar. No vent.  2/20 Trach change to 6 cuffless 2/28: fever 3/13: eliquis started 4/3: fever 06/06/2021 CT of the chest with new PE and haziness consistent with pneumonia 4/5-4/9: responding well to PRN lasix 4/11 fever again after completion of abx- work up in progress 4/13 continues to have low-grade temps cultures from 4/11 no growth to date-suspect silent/occult aspiration-heparin changed back to Eliquis 4/13 4/14: Work-up negative with no growth to date but red  menance Tmax elevated 99.4 and respiratory rate up-x-ray did not show         any pneumonia but probably silently aspirating 08/04/21- ongoing.  Patient remains unresponsive.  Awaiting for placement and disposition has been difficult 6/20 -> difficult placement team working [facility], [facility]?   Assessment and Plan:  S/p Cardiac arrest Postarrest anoxic encephalopathy Postarrest respiratory failure s/p tracheostomy S/P PEG tube placement 2D echocardiogram showed reduced LV function at <20% with global hypokinesis.  Now status post trach and PEG.  Remains obtunded with anoxic encephalopathy. -Continue Keppra  Anoxic encephalopathy ([name]) MRI brain significant for hypoxic brain injury. Continue supportive care. Patient currently keeps his eyes open, but does not answer questions or follow commands. Unchanged neurologic status   Acute respiratory failure with hypoxia Status post trach collar. Continue supportive care. Now chronic.   RLL pneumonia Previously had completed vancomycin for MSSA and [name] in early march.  Received more than 7-day course of IV Fortaz. On 4/3, CT chest was ordered that indicated an area of alveolar infiltration that may represent pneumonia.  Tracheal aspirate has been sent for culture and is significant for normal respiratory flora. Concerning for aspiration. Completed course of Zosyn. Patient with worsening secretions. Chest x-ray on 6/11 concerning for possible pneumonia. Patient retreated for pneumonia with Cefepime x7 days. Patient has developed worsening secretions. CT chest negative for recurrent pneumonia.  Positive blood cultures Tmax of 100 F on 6/19. No clear source, although patient has had multiple episodes of aspiration pneumonia. Blood cultures (6/20) obtained and are positive for staphylococcus capitis. Repeat blood cultures (6/20) preliminarily positive for GPC in clusters. Discussed with ID. Repeat cultures with different species  of staphylococcus.  Likely contaminant. Antibiotics held.   DVT (deep venous thrombosis) ([name]) Duplex ultrasound of lower extremity on 05/15/2021 showed age indeterminate deep vein thrombosis involving the left femoral vein, left proximal profunda vein, left posterior tibial veins, left popliteal vein, and left peroneal veins. -Continue Eliquis   Dysphagia On bolus feeds per dietitian -Aspiration precautions -Continue tube feeds  Emesis Unsure of etiology. Tube feeds held and eventually resumed. Abdominal x-ray without clear etiology. Obtained CT abdomen/pelvis which did not identify an etiology for symptoms. -Zofran PRN   Pulmonary embolus ([name]) Noted to have possible acute on chronic pulmonary embolism on CT.  Also have some evidence of possible pulmonary hypertension.  Patient was previously managed on Eliquis which is transitioned to Lovenox -Continue Eliquis   Pressure injury of skin Lateral penis, posterior right heel, right foot. Not present on admission.   HTN (hypertension) Blood pressure continues to be elevated. -Continue Coreg, Cardura, isosorbide dinitrate and hydralazine -PRN hydralazine   HFrEF (heart failure with reduced ejection fraction) ([name]) 2D echocardiogram-LVEF < 20%,  -Continue coreg, imdur, spironolactone   Myoclonus Seen by neurology. Per neuro notes, suppressing his myoclonus will not ultimately change the prognosis. -Continue Keppra, Klonopin  Peripheral edema Possibly secondary to amount of fluid patient is receiving via PEG tube for feeds and medicine. In/Out with net negative, but unsure how accurate this is. Weight has remained relatively stable, however. -Strict in/out   Hypernatremia-resolved Continue free water flushes   DVT prophylaxis: Lovenox Code Status:   Code Status: Full Code Family Communication: None at bedside Disposition Plan: Discharge home pending insurance, home health/services availability vs SNF. TOC working on possible  home care and private duty. No SNF offers at this time.   Consultants:  Infectious disease Palliative care Neurology Cardiology Critical care  Procedures:  Tracheostomy PEG tube  Antimicrobials: Unasyn Cefepime Ceftazidime Vancomycin Cefazolin   Subjective: Non-verbal  Objective: BP (!) 122/92   Pulse 98   Temp 98.6 F (37 C) (Oral)   Resp 20   Ht 5\' 10"  (1.778 m)   Wt 95 kg   SpO2 99%   BMI 30.05 kg/m   Examination:  General exam: Appears calm and comfortable Respiratory system: Mild rhonchi. Respiratory effort normal. Cardiovascular system: S1 & S2 heard, RRR. Gastrointestinal system: Abdomen is nondistended, soft and nontender. Normal bowel sounds heard. Central nervous system: Alert. Not oriented.   Data Reviewed: I have personally reviewed following labs and imaging studies  CBC Lab Results  Component Value Date   WBC 10.4 08/22/2021   RBC 4.09 (L) 08/22/2021   HGB 12.4 (L) 08/22/2021   HCT 38.7 (L) 08/22/2021   MCV 94.6 08/22/2021   MCH 30.3 08/22/2021   PLT 263 08/22/2021   MCHC 32.0 08/22/2021   RDW 15.0 08/22/2021   LYMPHSABS 1.6 08/22/2021   MONOABS 1.5 (H) 08/22/2021   EOSABS 0.1 08/22/2021   BASOSABS 0.0 08/22/2021     Last metabolic panel Lab Results  Component Value Date   NA 140 08/21/2021   K 4.4 08/21/2021   CL 103 08/21/2021   CO2 30 08/21/2021   BUN 42 (H) 08/21/2021   CREATININE 0.64 08/21/2021   GLUCOSE 147 (H) 08/21/2021   GFRNONAA >60 08/21/2021   CALCIUM 9.4 08/21/2021   PHOS 3.2 08/21/2021   PROT 6.8 08/21/2021   ALBUMIN 2.4 (L) 08/21/2021   BILITOT 0.3 08/21/2021   ALKPHOS 58 08/21/2021   AST 18 08/21/2021   ALT 14 08/21/2021   ANIONGAP 7 08/21/2021    GFR:  Estimated Creatinine Clearance: 127.8 mL/min (by C-G formula based on SCr of 0.64 mg/dL).   Radiology Studies: No results found.    LOS: 151 days    Jacquelin Hawking, MD [facility] 08/28/2021, 11:40 AM   If 7PM-7AM, please contact  night-coverage [email or web address]

## 2021-08-29 MED ORDER — ORAL CARE MOUTH RINSE: 15.0000 mL | OROMUCOSAL | Status: DC | PRN

## 2021-08-29 MED ORDER — ORAL CARE MOUTH RINSE
15.0000 mL | OROMUCOSAL | Status: DC
  Administered 2021-08-29 – 2021-10-04 (×143): 15 mL via OROMUCOSAL

## 2021-08-29 NOTE — Progress Notes (Signed)
NAME:  Billy Cherry, MRN:  [id number], DOB:  1970-09-18, LOS: 152 ADMISSION DATE:  03/30/2021, CONSULTATION DATE:  1/19 REFERRING MD:  Evette Georges, CHIEF COMPLAINT:  Found down, cardiac arrest   Brief Pt Description / Synopsis:  51 year old male with out-of-hospital V. fib cardiac arrest in setting of respiratory arrest due to suspected angioedema from ACE inhibitor leading to asphyxiation.  Concern on his 12 lead EKG for anterolateral ST elevation so he was taken to the cath lab where his study showed no significant coronary artery disease and an LVEF < 20%  Now with anoxic brain injury.  Pertinent  Medical History  Hypertension  Significant Hospital Events: Including procedures, antibiotic start and stop dates in addition to other pertinent events   1/19 admission, left heart cath without significant CAD.  Concern for anoxic brain injury 1/20: MRI pending, Neuro following 1/21: MRI shows areas of hypoxic-ischemic injury bilaterally. Fevered; cultures obtained. 1/23 severe brain damage 1/24 evidence of severe brain damage, remains critically ill 1/25: Plan for PEG today. Neuro exam unchanged.  Does exhibit new eyelid tremor like movements and lip/jaw tremor like vs clonus movement.  Bolused Keppra and increased maintenance dose.  Repeat EEG. CTH: concerning for progression of anoxic injury.  1/26: Repeat EEG unchanged and continues to show myoclonic seizures despite addition of Valproic acid yesterday. transfer to [facility] for continuous EEG 1/28: Continues to have myoclonic sz on EEG 1/29 No acute issues overnight 2/3 no meaningful neuro recovery. Diuresed 2/5 unchanged 2/6 New fever to 102, WBC 16K - treated with vancomycin for 7d based on cultures.  2/11: percutaneous tracheostomy 2/13 >2/20 on trach collar. No vent.  2/20 Trach change to 6 cuffless 2/27 status unchanged 3/13 unchanged 3/20 due for trach exchange given continue secretions (although improved) will stick to 6 cuffless  for exchange  06/05/2021 Continued thick  secretions, scant blood tinged 4/3/am 06/06/2021 CT of the chest with new PE and haziness consistent with pneumonia 5/30 continue tube feeding/free water  Interim History / Subjective:  Stable on ATC at 28%. Still having thick purulent secretions.   Objective   Blood pressure 114/77, pulse 86, temperature 99 F (37.2 C), temperature source Oral, resp. rate 20, height 5\' 10"  (1.778 m), weight 95 kg, SpO2 97 %.    FiO2 (%):  [28 %] 28 %   Intake/Output Summary (Last 24 hours) at 08/29/2021 1022 Last data filed at 08/29/2021 0900 Gross per 24 hour  Intake 200 ml  Output 1850 ml  Net -1650 ml    Filed Weights   08/26/21 0400 08/28/21 0006 08/29/21 0343  Weight: 96 kg 95 kg 95 kg   Examination:  General: lying in bed in NAD HEENT: Trach intact, thick secretions present on towel covering his chest, [place]/AT, eyes anicteric Resp: clear to auscultation bilaterally, no wheezing Cardiac: RRR Neuro: not following commands, no meaningful movements Abd: PEG in place, soft, non-distended  Assessment & Plan:  Out of hospital V-fib arrest Postarrest anoxic brain injury Myoclonus Respiratory failure with prolonged mechanical ventilation Tracheostomy dependent Cardiomyopathy Pulmonary embolism Hypertension  -Trach care per protocol and suctioning protocol - Not a candidate for decannulation -Continue atropine drops, Glycopyrrolate per tube, scopolamine patch -Continue chest PT -Con't eliquis; anticipate he will need indefinitely -Overall prognosis remains poor.  Rest per primary team. PCCM will continue to follow weekly. Please call if needs arise sooner.   Melody Comas, MD [facility] Office: [phone]   See Amion for personal pager PCCM on call pager [phone] until 7pm. Please call Elink 7p-7a. [phone]

## 2021-08-29 NOTE — Progress Notes (Signed)
Nutrition Follow-up  DOCUMENTATION CODES:   Not applicable  INTERVENTION:   Continue tube feeding via PEG:  1.5 cartons of Osmolite 1.5 - QID (355 mL each bolus) 45 mL ProSource TF given TID 200 mL free water every 6 hours  Provides 2250 kcal, 122 gm PRO, and 1086 mL free water (1886 mL total free water) daily.    Continue Juven BID per tube, each packet provides 80 calories, 8 grams of carbohydrate, 2.5 grams of protein to promote wound healing  NUTRITION DIAGNOSIS:   Inadequate oral intake related to inability to eat as evidenced by NPO status.  Ongoing  GOAL:   Patient will meet greater than or equal to 90% of their needs  Met with TF  MONITOR:   Labs, Weight trends, TF tolerance, Skin, I & O's  REASON FOR ASSESSMENT:   Ventilator, Consult Enteral/tube feeding initiation and management  ASSESSMENT:   51 year old male who originally [facility] to [facility] 1/[facility] after out-of-hospital cardiac arrest due to suspected angioedema from ACE inhibitor leading to asphyxiation. PMH of HTN, HLD.  2/11 - tracheostomy placed 2/22 - PEG placed    UOP: 1850 ml x 24 hours  Weight stable  Medications reviewed and include nutrisource fiber, keppra protonix, miralax, and aldactone.   Labs reviewed:  Diet Order:   Diet Order     None       EDUCATION NEEDS:   Not appropriate for education at this time  Skin:  Skin Assessment: Skin Integrity Issues: Skin Integrity Issues:: Stage I DTI: N/A Stage I: rt heel, rt upper posterior foot, Stage II: N/A Stage III: N/A Unstageable: N/A Other: MASD to bilateral buttocks  Last BM:  08/14/21  Height:   Ht Readings from Last 1 Encounters:  04/18/21 $RemoveB'5\' 10"'[id number]$  (1.778 m)    Weight:   Wt Readings from Last 1 Encounters:  08/29/21 95 kg    Ideal Body Weight:  75.5 kg  BMI:  Body mass index is 30.05 kg/m.  Estimated Nutritional Needs:   Kcal:  2200-2400  Protein:  110-125 grams  Fluid:  >/= 2.2 L   Lynnelle Mesmer P.,  RD, LDN, CNSC See AMiON for contact information

## 2021-08-29 NOTE — Progress Notes (Addendum)
9am: CSW spoke with Lorriane Shire at [facility] who states the agency has obtained adequate staffing to provide this patient with services at home but the RN is completing her onboarding process. Lorriane Shire states he will schedule a family meeting with [facility] staff and the patient's wife in 1-2 days to ensure all agree with the care plan for service delivery. Lorriane Shire will check with the clinical staff to determine if the insurance authorization is still active. Lorriane Shire will deliver forms to nurse's station on unit for the patient's wife to sign upon her visit.  8:10am: CSW spoke with Riki Rusk at [facility] who states he will have Lorriane Shire at the agency return call with an update.  Edwin Dada, MSW, LCSW Transitions of Care  Clinical Social Worker II [phone]

## 2021-08-30 ENCOUNTER — Other Ambulatory Visit (HOSPITAL_COMMUNITY): Payer: Self-pay

## 2021-08-30 NOTE — Progress Notes (Signed)
PROGRESS NOTE    Billy Cherry  DQQ:[id number] DOB: 01/24/1971 DOA: 03/30/2021 PCP: Patient, No Pcp Per   Brief Narrative: Patient is a 51 years old male with past medical history of hypertension who was brought into the hospital after being found unresponsive by his wife.  Patient was initially admitted to [facility] hospital for out of hospital cardiac arrest and ventricular fibrillation with respiratory arrest suspected from angioedema from ACE inhibitor leading to asphyxiation.  Patient was then subsequently transferred to [facility].  Below is the Sequence of events during hospitalization,  1/19 left heart cath without significant CAD.  Concern for anoxic brain injury 1/21: MRI shows areas of hypoxic-ischemic injury bilaterally. Fevered; cultures obtained. 1/23 severe brain damage 1/25: PEG . Neuro exam unchanged.  Does exhibit new eyelid tremor like movements and lip/jaw tremor like vs clonus movement.  Bolused Keppra and increased maintenance dose.  Repeat EEG. CTH: concerning for progression of anoxic injury.  1/26: Repeat EEG unchanged and continues to show myoclonic seizures despite addition of Valproic acid yesterday.  03/30/2021 Transfer to [facility] from [place] for continuous EEG 1/28: Continues to have myoclonic sz on EEG 2/3 no meaningful neuro recovery. Diuresed 2/6 New fever to 102, WBC 16K - treated with vancomycin for 7d based on cultures.  2/11: percutaneous tracheostomy 2/13 >2/20 on trach collar. No vent.  2/20 Trach change to 6 cuffless 2/28: fever 3/13: eliquis started 4/3: fever 06/06/2021 CT of the chest with new PE and haziness consistent with pneumonia 4/5-4/9: responding well to PRN lasix 4/11 fever again after completion of abx- work up in progress 4/13 continues to have low-grade temps cultures from 4/11 no growth to date-suspect silent/occult aspiration-heparin changed back to Eliquis 4/13 4/14: Work-up negative with no growth to date but red  menance Tmax elevated 99.4 and respiratory rate up-x-ray did not show         any pneumonia but probably silently aspirating 08/04/21- ongoing.  Patient remains unresponsive.  Awaiting for placement and disposition has been difficult 6/20 -> difficult placement team working [facility], [facility]? 6/21 > 6/27: positive blood cultures (staph capitis/epidermidis), likely contamination. No antibiotics initiated.   Assessment and Plan:  S/p Cardiac arrest Postarrest anoxic encephalopathy Postarrest respiratory failure s/p tracheostomy S/P PEG tube placement 2D echocardiogram showed reduced LV function at <20% with global hypokinesis.  Now status post trach and PEG.  Remains obtunded with anoxic encephalopathy. -Continue Keppra  Anoxic encephalopathy ([name]) MRI brain significant for hypoxic brain injury. Continue supportive care. Patient currently keeps his eyes open, but does not answer questions or follow commands. Unchanged neurologic status   Acute respiratory failure with hypoxia Status post trach collar. Continue supportive care. Now chronic.   RLL pneumonia Previously had completed vancomycin for MSSA and [name] in early march.  Received more than 7-day course of IV Fortaz. On 4/3, CT chest was ordered that indicated an area of alveolar infiltration that may represent pneumonia.  Tracheal aspirate has been sent for culture and is significant for normal respiratory flora. Concerning for aspiration. Completed course of Zosyn. Patient with worsening secretions. Chest x-ray on 6/11 concerning for possible pneumonia. Patient retreated for pneumonia with Cefepime x7 days. Patient has developed worsening secretions. CT chest negative for recurrent pneumonia. Aggressive pulmonary toileting.  Positive blood cultures Tmax of 100 F on 6/19. No clear source, although patient has had multiple episodes of aspiration pneumonia. Blood cultures (6/20) obtained and are positive for  staphylococcus capitis. Repeat blood cultures (6/20)  preliminarily positive for GPC in clusters. Discussed with ID. Repeat cultures with different species of staphylococcus. Likely contaminant. Antibiotics held.   DVT (deep venous thrombosis) ([name]) Duplex ultrasound of lower extremity on 05/15/2021 showed age indeterminate deep vein thrombosis involving the left femoral vein, left proximal profunda vein, left posterior tibial veins, left popliteal vein, and left peroneal veins. -Continue Eliquis   Dysphagia On bolus feeds per dietitian -Aspiration precautions -Continue tube feeds  Emesis Unsure of etiology. Tube feeds held and eventually resumed. Abdominal x-ray without clear etiology. Obtained CT abdomen/pelvis which did not identify an etiology for symptoms. -Zofran PRN   Pulmonary embolus ([name]) Noted to have possible acute on chronic pulmonary embolism on CT.  Also have some evidence of possible pulmonary hypertension.  Patient was previously managed on Eliquis which is transitioned to Lovenox -Continue Eliquis   Pressure injury of skin Lateral penis, posterior right heel, right foot. Not present on admission.   HTN (hypertension) Blood pressure continues to be elevated. -Continue Coreg, Cardura, isosorbide dinitrate and hydralazine -PRN hydralazine   HFrEF (heart failure with reduced ejection fraction) ([name]) 2D echocardiogram-LVEF < 20%,  -Continue coreg, imdur, spironolactone   Myoclonus Seen by neurology. Per neuro notes, suppressing his myoclonus will not ultimately change the prognosis. -Continue Keppra, Klonopin  Peripheral edema Possibly secondary to amount of fluid patient is receiving via PEG tube for feeds and medicine. In/Out with net negative, but unsure how accurate this is. Weight has remained relatively stable, however. -Strict in/out   Hypernatremia-resolved Continue free water flushes   DVT prophylaxis: Lovenox Code Status:   Code Status: Full  Code Family Communication: None. Disposition Plan: Discharge home pending insurance, home health/services availability vs SNF. TOC working on possible home care and private duty. No SNF offers at this time.   Consultants:  Infectious disease Palliative care Neurology Cardiology Critical care  Procedures:  Tracheostomy PEG tube  Antimicrobials: Unasyn Cefepime Ceftazidime Vancomycin Cefazolin   Subjective:  Seen and examined with the nursing staff at the bedside.  No new events except plenty of secretions through the tracheostomy.  Afebrile. Objective: BP (!) 136/96 (BP Location: Left Arm)   Pulse 95   Temp 98.5 F (36.9 C) (Oral)   Resp 16   Ht 5\' 10"  (1.778 m)   Wt 95 kg   SpO2 91%   BMI 30.05 kg/m   Examination:  General: Lying in bed.  Looks comfortable.  Tracheostomy intact.  Thick secretions on the covering towel. Does not follow any commands.  No spontaneous movements. Respiratory: Some conducted upper airway sounds.  No other added sounds. Gastrointestinal: PEG in place.     Data Reviewed: I have personally reviewed following labs and imaging studies  CBC Lab Results  Component Value Date   WBC 10.4 08/22/2021   RBC 4.09 (L) 08/22/2021   HGB 12.4 (L) 08/22/2021   HCT 38.7 (L) 08/22/2021   MCV 94.6 08/22/2021   MCH 30.3 08/22/2021   PLT 263 08/22/2021   MCHC 32.0 08/22/2021   RDW 15.0 08/22/2021   LYMPHSABS 1.6 08/22/2021   MONOABS 1.5 (H) 08/22/2021   EOSABS 0.1 08/22/2021   BASOSABS 0.0 08/22/2021     Last metabolic panel Lab Results  Component Value Date   NA 140 08/21/2021   K 4.4 08/21/2021   CL 103 08/21/2021   CO2 30 08/21/2021   BUN 42 (H) 08/21/2021   CREATININE 0.64 08/21/2021   GLUCOSE 147 (H) 08/21/2021   GFRNONAA >60 08/21/2021   CALCIUM 9.4 08/21/2021  PHOS 3.2 08/21/2021   PROT 6.8 08/21/2021   ALBUMIN 2.4 (L) 08/21/2021   BILITOT 0.3 08/21/2021   ALKPHOS 58 08/21/2021   AST 18 08/21/2021   ALT 14 08/21/2021    ANIONGAP 7 08/21/2021    GFR: Estimated Creatinine Clearance: 127.8 mL/min (by C-G formula based on SCr of 0.64 mg/dL).   Radiology Studies: No results found.    LOS: 153 days   Total time spent: 25 minutes

## 2021-08-30 NOTE — Progress Notes (Signed)
RN was in room suctioning patient, pt was coughing and then coughed up tube feeds from his mouth. RN paged MD and also informed him that per night shift RN, when pt was turned when being cleaned up he would vomit up tube feeds as well. New orders to hold bolus feedings. WCTM.

## 2021-08-31 NOTE — Progress Notes (Signed)
CSW spoke with Morrie Sheldon, Development worker, international aid at [facility] to answer questions regarding patient. Morrie Sheldon states the RN at the agency will be meeting with the patient's wife today to discuss services and the care plan.  CSW spoke with [place] at [facility] who states the agency can accept the patient for primary care follow up. Veronica requested CSW return call when a definite discharge date has been determined.  Edwin Dada, MSW, LCSW Transitions of Care  Clinical Social Worker II [phone]

## 2021-08-31 NOTE — Progress Notes (Signed)
PROGRESS NOTE    Billy Cherry  VOH:[id number] DOB: 1970-03-17 DOA: 03/30/2021 PCP: Patient, No Pcp Per   Brief Narrative: Patient is a 51 years old male with past medical history of hypertension who was brought into the hospital after being found unresponsive by his wife.  Patient was initially admitted to [facility] hospital for out of hospital cardiac arrest and ventricular fibrillation with respiratory arrest suspected from angioedema from ACE inhibitor leading to asphyxiation.  Patient was then subsequently transferred to [facility].  Below is the Sequence of events during hospitalization,  1/19 left heart cath without significant CAD.  Concern for anoxic brain injury 1/21: MRI shows areas of hypoxic-ischemic injury bilaterally. Fevered; cultures obtained. 1/23 severe brain damage 1/25: PEG . Neuro exam unchanged.  Does exhibit new eyelid tremor like movements and lip/jaw tremor like vs clonus movement.  Bolused Keppra and increased maintenance dose.  Repeat EEG. CTH: concerning for progression of anoxic injury.  1/26: Repeat EEG unchanged and continues to show myoclonic seizures despite addition of Valproic acid yesterday.  03/30/2021 Transfer to [facility] from [place] for continuous EEG 1/28: Continues to have myoclonic sz on EEG 2/3 no meaningful neuro recovery. Diuresed 2/6 New fever to 102, WBC 16K - treated with vancomycin for 7d based on cultures.  2/11: percutaneous tracheostomy 2/13 >2/20 on trach collar. No vent.  2/20 Trach change to 6 cuffless 2/28: fever 3/13: eliquis started 4/3: fever 06/06/2021 CT of the chest with new PE and haziness consistent with pneumonia 4/5-4/9: responding well to PRN lasix 4/11 fever again after completion of abx- work up in progress 4/13 continues to have low-grade temps cultures from 4/11 no growth to date-suspect silent/occult aspiration-heparin changed back to Eliquis 4/13 4/14: Work-up negative with no growth to date but red  menance Tmax elevated 99.4 and respiratory rate up-x-ray did not show         any pneumonia but probably silently aspirating 08/04/21- ongoing.  Patient remains unresponsive.  Awaiting for placement and disposition has been difficult 6/20 -> difficult placement team [facility], [facility]? 6/21 > 6/27: positive blood cultures (staph capitis/epidermidis), likely contamination. No antibiotics initiated.   Assessment and Plan:  S/p Cardiac arrest Postarrest anoxic encephalopathy Postarrest respiratory failure s/p tracheostomy S/P PEG tube placement 2D echocardiogram showed reduced LV function at <20% with global hypokinesis.  Now status post trach and PEG.  Remains obtunded with anoxic encephalopathy. -Continue Keppra  Anoxic encephalopathy ([name]) MRI brain significant for hypoxic brain injury. Continue supportive care. Patient currently keeps his eyes open, but does not answer questions or follow commands. Unchanged neurologic status   Acute respiratory failure with hypoxia Status post trach collar. Continue supportive care. Now chronic.   RLL pneumonia Previously had completed vancomycin for MSSA and [name] in early march.  Received more than 7-day course of IV Fortaz. On 4/3, CT chest was ordered that indicated an area of alveolar infiltration that may represent pneumonia.  Tracheal aspirate has been sent for culture and is significant for normal respiratory flora. Concerning for aspiration. Completed course of Zosyn. Patient with worsening secretions. Chest x-ray on 6/11 concerning for possible pneumonia. Patient retreated for pneumonia with Cefepime x7 days. Patient has developed worsening secretions. CT chest negative for recurrent pneumonia. Aggressive pulmonary toileting.  Positive blood cultures Tmax of 100 F on 6/19. No clear source, although patient has had multiple episodes of aspiration pneumonia. Blood cultures (6/20) obtained and are positive for  staphylococcus capitis. Repeat blood cultures (6/20)  preliminarily positive for GPC in clusters. Discussed with ID. Repeat cultures with different species of staphylococcus. Likely contaminant. Antibiotics held.   DVT (deep venous thrombosis) ([name]) Duplex ultrasound of lower extremity on 05/15/2021 showed age indeterminate deep vein thrombosis involving the left femoral vein, left proximal profunda vein, left posterior tibial veins, left popliteal vein, and left peroneal veins. -Continue Eliquis   Dysphagia On bolus feeds per dietitian -Aspiration precautions -Continue tube feeds, occasionally vomits tube feeding.  Emesis Unsure of etiology.  Significant reflux of tube feeding.  Elevate head of bed more than 30 degrees.  Resume tube feeding today.  Will monitor.  Abdominal x-ray without clear etiology. Obtained CT abdomen/pelvis which did not identify an etiology for symptoms. -Zofran PRN   Pulmonary embolus ([name]) Noted to have possible acute on chronic pulmonary embolism on CT.  Also have some evidence of possible pulmonary hypertension.  Patient was previously managed on Eliquis which is transitioned to Lovenox -Continue Eliquis   Pressure injury of skin Lateral penis, posterior right heel, right foot. Not present on admission.   HTN (hypertension) Blood pressure continues to be elevated. -Continue Coreg, Cardura, isosorbide dinitrate and hydralazine -PRN hydralazine   HFrEF (heart failure with reduced ejection fraction) ([name]) 2D echocardiogram-LVEF < 20%,  -Continue coreg, imdur, spironolactone   Myoclonus Seen by neurology. Per neuro notes, suppressing his myoclonus will not ultimately change the prognosis. -Continue Keppra, Klonopin  Peripheral edema Possibly secondary to amount of fluid patient is receiving via PEG tube for feeds and medicine. In/Out with net negative, but unsure how accurate this is. Weight has remained relatively stable, however. -Strict in/out    Hypernatremia-resolved Continue free water flushes   DVT prophylaxis: Lovenox Code Status:   Code Status: Full Code Family Communication: None. Disposition Plan: Discharge home pending insurance, home health/services availability vs SNF. TOC working on possible home care and private duty. No SNF offers at this time.   Consultants:  Infectious disease Palliative care Neurology Cardiology Critical care  Procedures:  Tracheostomy PEG tube  Antimicrobials: Unasyn Cefepime Ceftazidime Vancomycin Cefazolin   Subjective: Patient seen and examined.  Yesterday all day he had significant tube feeding through his tracheostomy and was also vomiting.  Tube feeding held overnight. Copious respiratory secretions on the tracheostomy but no more vomiting. Resume tube feeding today and monitor. Objective: BP (!) 126/94 (BP Location: Right Arm)   Pulse 86   Temp 98.9 F (37.2 C) (Oral)   Resp 16   Ht 5\' 10"  (1.778 m)   Wt 96 kg   SpO2 100%   BMI 30.37 kg/m   Examination:  General: Lying in bed.  Looks comfortable.  Tracheostomy intact.  Thick secretions on deep suctioning. Does not follow any commands.  No spontaneous movements.  Spontaneously opens eyes.  Does not follow any other commands. Respiratory: Some conducted upper airway sounds.  No other added sounds. Gastrointestinal: PEG in place.     Data Reviewed: I have personally reviewed following labs and imaging studies  CBC Lab Results  Component Value Date   WBC 10.4 08/22/2021   RBC 4.09 (L) 08/22/2021   HGB 12.4 (L) 08/22/2021   HCT 38.7 (L) 08/22/2021   MCV 94.6 08/22/2021   MCH 30.3 08/22/2021   PLT 263 08/22/2021   MCHC 32.0 08/22/2021   RDW 15.0 08/22/2021   LYMPHSABS 1.6 08/22/2021   MONOABS 1.5 (H) 08/22/2021   EOSABS 0.1 08/22/2021   BASOSABS 0.0 08/22/2021     Last metabolic panel Lab Results  Component  Value Date   NA 140 08/21/2021   K 4.4 08/21/2021   CL 103 08/21/2021   CO2 30  08/21/2021   BUN 42 (H) 08/21/2021   CREATININE 0.64 08/21/2021   GLUCOSE 147 (H) 08/21/2021   GFRNONAA >60 08/21/2021   CALCIUM 9.4 08/21/2021   PHOS 3.2 08/21/2021   PROT 6.8 08/21/2021   ALBUMIN 2.4 (L) 08/21/2021   BILITOT 0.3 08/21/2021   ALKPHOS 58 08/21/2021   AST 18 08/21/2021   ALT 14 08/21/2021   ANIONGAP 7 08/21/2021    GFR: Estimated Creatinine Clearance: 128.4 mL/min (by C-G formula based on SCr of 0.64 mg/dL).   Radiology Studies: No results found.    LOS: 154 days   Total time spent: 25 minutes

## 2021-09-01 MED ORDER — OSMOLITE 1.5 CAL PO LIQD
1000.0000 mL | ORAL | Status: DC
Start: 2021-09-01 — End: 2021-10-04
  Administered 2021-09-01 – 2021-10-04 (×27): 1000 mL
  Filled 2021-09-01 (×25): qty 1000

## 2021-09-01 MED ORDER — PROSOURCE TF PO LIQD
45.0000 mL | Freq: Two times a day (BID) | ORAL | Status: DC
  Administered 2021-09-01 – 2021-10-04 (×65): 45 mL
  Filled 2021-09-01 (×61): qty 45

## 2021-09-01 NOTE — Progress Notes (Signed)
PROGRESS NOTE    Billy Cherry  DQQ:[id number] DOB: 01/24/1971 DOA: 03/30/2021 PCP: Patient, No Pcp Per   Brief Narrative: Patient is a 51 years old male with past medical history of hypertension who was brought into the hospital after being found unresponsive by his wife.  Patient was initially admitted to [facility] hospital for out of hospital cardiac arrest and ventricular fibrillation with respiratory arrest suspected from angioedema from ACE inhibitor leading to asphyxiation.  Patient was then subsequently transferred to [facility].  Below is the Sequence of events during hospitalization,  1/19 left heart cath without significant CAD.  Concern for anoxic brain injury 1/21: MRI shows areas of hypoxic-ischemic injury bilaterally. Fevered; cultures obtained. 1/23 severe brain damage 1/25: PEG . Neuro exam unchanged.  Does exhibit new eyelid tremor like movements and lip/jaw tremor like vs clonus movement.  Bolused Keppra and increased maintenance dose.  Repeat EEG. CTH: concerning for progression of anoxic injury.  1/26: Repeat EEG unchanged and continues to show myoclonic seizures despite addition of Valproic acid yesterday.  03/30/2021 Transfer to [facility] from [place] for continuous EEG 1/28: Continues to have myoclonic sz on EEG 2/3 no meaningful neuro recovery. Diuresed 2/6 New fever to 102, WBC 16K - treated with vancomycin for 7d based on cultures.  2/11: percutaneous tracheostomy 2/13 >2/20 on trach collar. No vent.  2/20 Trach change to 6 cuffless 2/28: fever 3/13: eliquis started 4/3: fever 06/06/2021 CT of the chest with new PE and haziness consistent with pneumonia 4/5-4/9: responding well to PRN lasix 4/11 fever again after completion of abx- work up in progress 4/13 continues to have low-grade temps cultures from 4/11 no growth to date-suspect silent/occult aspiration-heparin changed back to Eliquis 4/13 4/14: Work-up negative with no growth to date but red  menance Tmax elevated 99.4 and respiratory rate up-x-ray did not show         any pneumonia but probably silently aspirating 08/04/21- ongoing.  Patient remains unresponsive.  Awaiting for placement and disposition has been difficult 6/20 -> difficult placement team working on options, [facility]? 6/21 > 6/27: positive blood cultures (staph capitis/epidermidis), likely contamination. No antibiotics initiated.   Assessment and Plan:  S/p Cardiac arrest Postarrest anoxic encephalopathy Postarrest respiratory failure s/p tracheostomy S/P PEG tube placement 2D echocardiogram showed reduced LV function at <20% with global hypokinesis.  Now status post trach and PEG.  Remains obtunded with anoxic encephalopathy. -Continue Keppra  Anoxic encephalopathy ([name]) MRI brain significant for hypoxic brain injury. Continue supportive care. Patient currently keeps his eyes open, but does not answer questions or follow commands. Unchanged neurologic status   Acute respiratory failure with hypoxia Status post trach collar. Continue supportive care. Now chronic.   RLL pneumonia Previously had completed vancomycin for MSSA and [name] in early march.  Received more than 7-day course of IV Fortaz. On 4/3, CT chest was ordered that indicated an area of alveolar infiltration that may represent pneumonia.  Tracheal aspirate has been sent for culture and is significant for normal respiratory flora. Concerning for aspiration. Completed course of Zosyn. Patient with worsening secretions. Chest x-ray on 6/11 concerning for possible pneumonia. Patient retreated for pneumonia with Cefepime x7 days. Patient has developed worsening secretions. CT chest negative for recurrent pneumonia. Aggressive pulmonary toileting.  Positive blood cultures Tmax of 100 F on 6/19. No clear source, although patient has had multiple episodes of aspiration pneumonia. Blood cultures (6/20) obtained and are positive for  staphylococcus capitis. Repeat blood cultures (6/20)  preliminarily positive for GPC in clusters. Discussed with ID. Repeat cultures with different species of staphylococcus. Likely contaminant. Antibiotics held.   DVT (deep venous thrombosis) ([name]) Duplex ultrasound of lower extremity on 05/15/2021 showed age indeterminate deep vein thrombosis involving the left femoral vein, left proximal profunda vein, left posterior tibial veins, left popliteal vein, and left peroneal veins. -Continue Eliquis   Dysphagia On bolus feeds per dietitian -Aspiration precautions -Continue tube feeds  Emesis Unsure of etiology. Tube feeds held and eventually resumed. Abdominal x-ray without clear etiology. Obtained CT abdomen/pelvis which did not identify an etiology for symptoms. -Zofran PRN   Pulmonary embolus ([name]) Noted to have possible acute on chronic pulmonary embolism on CT.  Also have some evidence of possible pulmonary hypertension.  Patient was previously managed on Eliquis which is transitioned to Lovenox -Continue Eliquis   Pressure injury of skin Lateral penis, posterior right heel, right foot. Not present on admission.   HTN (hypertension) Blood pressure continues to be elevated. -Continue Coreg, Cardura, isosorbide dinitrate and hydralazine -PRN hydralazine   HFrEF (heart failure with reduced ejection fraction) ([name]) 2D echocardiogram-LVEF < 20%,  -Continue coreg, imdur, spironolactone   Myoclonus Seen by neurology. Per neuro notes, suppressing his myoclonus will not ultimately change the prognosis. -Continue Keppra, Klonopin  Peripheral edema Possibly secondary to amount of fluid patient is receiving via PEG tube for feeds and medicine. In/Out with net negative, but unsure how accurate this is. Weight has remained relatively stable, however. -Strict in/out     DVT prophylaxis: Lovenox Code Status:   Code Status: Full Code Family Communication: None. Disposition Plan: Discharge  home pending insurance, home health/services availability vs SNF. TOC working on possible home care and private duty. No SNF offers at this time.   Consultants:  Infectious disease Palliative care Neurology Cardiology Critical care  Procedures:  Tracheostomy PEG tube  Antimicrobials: Unasyn Cefepime Ceftazidime Vancomycin Cefazolin   Subjective:  Patient seen and examined.  No overnight events.  Still occasionally tube feeding comes out through mouth and tracheostomy. We will discuss with dietitian if total volume can be decreased on the tube feeding. Raise head of the bed all the time.   Objective: BP (!) 133/99 (BP Location: Right Arm)   Pulse 94   Temp 98.3 F (36.8 C) (Oral)   Resp 20   Ht 5\' 10"  (1.778 m)   Wt 95 kg   SpO2 99%   BMI 30.05 kg/m   Examination:  General: Lying in bed.  Looks comfortable.  Tracheostomy intact.  Thick secretions on the trach. Does not follow any commands.  No spontaneous movements. Respiratory: Some conducted upper airway sounds.  No other added sounds. Gastrointestinal: PEG in place.     Data Reviewed: I have personally reviewed following labs and imaging studies  CBC Lab Results  Component Value Date   WBC 10.4 08/22/2021   RBC 4.09 (L) 08/22/2021   HGB 12.4 (L) 08/22/2021   HCT 38.7 (L) 08/22/2021   MCV 94.6 08/22/2021   MCH 30.3 08/22/2021   PLT 263 08/22/2021   MCHC 32.0 08/22/2021   RDW 15.0 08/22/2021   LYMPHSABS 1.6 08/22/2021   MONOABS 1.5 (H) 08/22/2021   EOSABS 0.1 08/22/2021   BASOSABS 0.0 08/22/2021     Last metabolic panel Lab Results  Component Value Date   NA 140 08/21/2021   K 4.4 08/21/2021   CL 103 08/21/2021   CO2 30 08/21/2021   BUN 42 (H) 08/21/2021   CREATININE 0.64 08/21/2021  GLUCOSE 147 (H) 08/21/2021   GFRNONAA >60 08/21/2021   CALCIUM 9.4 08/21/2021   PHOS 3.2 08/21/2021   PROT 6.8 08/21/2021   ALBUMIN 2.4 (L) 08/21/2021   BILITOT 0.3 08/21/2021   ALKPHOS 58  08/21/2021   AST 18 08/21/2021   ALT 14 08/21/2021   ANIONGAP 7 08/21/2021    GFR: Estimated Creatinine Clearance: 127.8 mL/min (by C-G formula based on SCr of 0.64 mg/dL).   Radiology Studies: No results found.    LOS: 155 days   Total time spent: 25 minutes

## 2021-09-01 NOTE — Progress Notes (Signed)
Nutrition Follow-up  DOCUMENTATION CODES:   Not applicable  INTERVENTION:   Transition to continuous TF via PEG:   Osmolite 1.5 @ 60 ml/hr (1440 ml/day) 45 ml ProSource TF BID  Provides: 2240 kcal, 112 grams protein, 1094 ml free water.   200 ml free water every 6 hours Total free water: 1894  D/C : nutrisource fiber and Juven    NUTRITION DIAGNOSIS:   Inadequate oral intake related to inability to eat as evidenced by NPO status.  Ongoing  GOAL:   Patient will meet greater than or equal to 90% of their needs  Met with TF  MONITOR:   Labs, Weight trends, TF tolerance, Skin, I & O's  REASON FOR ASSESSMENT:   Ventilator, Consult Enteral/tube feeding initiation and management  ASSESSMENT:   51 year old male who originally presented to [facility] on 1/19 after out-of-hospital cardiac arrest due to suspected angioedema from ACE inhibitor leading to asphyxiation. PMH of HTN, HLD.  Consult received for pt who is having episodes of vomiting. Spoke with MD plan to transition to continuous feeding to establish tolerance.  On chart review pt has had episodes of emesis since 2/27 while on continuous feeding. CT of abd completed 6/22 with no acute findings. Will need to monitor.    2/11 - tracheostomy placed 2/22 - PEG placed    UOP: 1350 ml x 24 hours  Weight stable  Medications reviewed and include nutrisource fiber, protonix, miralax, and aldactone.   Labs reviewed  Diet Order:   Diet Order     None       EDUCATION NEEDS:   Not appropriate for education at this time  Skin:  Skin Assessment: Skin Integrity Issues: Skin Integrity Issues:: Stage I DTI: N/A Stage I: rt heel, rt upper posterior foot, Stage II: N/A Stage III: N/A Unstageable: N/A Other: MASD to bilateral buttocks  Last BM:  6/30 - large  Height:   Ht Readings from Last 1 Encounters:  04/18/21 $RemoveB'5\' 10"'SsrqcGla$  (1.778 m)    Weight:   Wt Readings from Last 1 Encounters:  09/01/21 95 kg     Ideal Body Weight:  75.5 kg  BMI:  Body mass index is 30.05 kg/m.  Estimated Nutritional Needs:   Kcal:  2200-2400  Protein:  110-125 grams  Fluid:  >/= 2.2 L   Tacara Hadlock P., RD, LDN, CNSC See AMiON for contact information

## 2021-09-02 NOTE — Progress Notes (Signed)
PROGRESS NOTE    Billy Cherry  DQQ:[id number] DOB: 01/24/1971 DOA: 03/30/2021 PCP: Patient, No Pcp Per   Brief Narrative: [name] 51 years old male with past medical history of hypertension who was brought into the hospital after being found unresponsive by his wife.  Patient was initially admitted to [facility] hospital for out of hospital cardiac arrest and ventricular fibrillation with respiratory arrest suspected from angioedema from ACE inhibitor leading to asphyxiation.  Patient was then subsequently transferred to [facility].  Below is the Sequence of events during hospitalization,  1/19 left heart cath without significant CAD.  Concern for anoxic brain injury 1/21: MRI shows areas of hypoxic-ischemic injury bilaterally. Fevered; cultures obtained. 1/23 severe brain damage 1/25: PEG . Neuro exam unchanged.  Does exhibit new eyelid tremor like movements and lip/jaw tremor like vs clonus movement.  Bolused Keppra and increased maintenance dose.  Repeat EEG. CTH: concerning for progression of anoxic injury.  1/26: Repeat EEG unchanged and continues to show myoclonic seizures despite addition of Valproic acid yesterday.  03/30/2021 Transfer to [facility] from [place] for continuous EEG 1/28: Continues to have myoclonic sz on EEG 2/3 no meaningful neuro recovery. Diuresed 2/6 New fever to 102, WBC 16K - treated with vancomycin for 7d based on cultures.  2/11: percutaneous tracheostomy 2/13 >2/20 on trach collar. No vent.  2/20 Trach change to 6 cuffless 2/28: fever 3/13: eliquis started 4/3: fever 06/06/2021 CT of the chest with new PE and haziness consistent with pneumonia 4/5-4/9: responding well to PRN lasix 4/11 fever again after completion of abx- work up in progress 4/13 continues to have low-grade temps cultures from 4/11 no growth to date-suspect silent/occult aspiration-heparin changed back to Eliquis 4/13 4/14: Work-up negative with no growth to date but red  menance Tmax elevated 99.4 and respiratory rate up-x-ray did not show         any pneumonia but probably silently aspirating 08/04/21- ongoing.  Patient remains unresponsive.  Awaiting for placement and disposition has been difficult 6/20 -> difficult placement team working on options, [facility]? 6/21 > 6/27: positive blood cultures (staph capitis/epidermidis), likely contamination. No antibiotics initiated.   Assessment and Plan:  S/p Cardiac arrest Postarrest anoxic encephalopathy Postarrest respiratory failure s/p tracheostomy S/P PEG tube placement 2D echocardiogram showed reduced LV function at <20% with global hypokinesis.  Now status post trach and PEG.  Remains obtunded with anoxic encephalopathy. -Continue Keppra  Anoxic encephalopathy ([name]) MRI brain significant for hypoxic brain injury. Continue supportive care. Patient currently keeps his eyes open, but does not answer questions or follow commands. Unchanged neurologic status   Acute respiratory failure with hypoxia Status post trach collar. Continue supportive care. Now chronic.   RLL pneumonia Previously had completed vancomycin for MSSA and [name] in early march.  Received more than 7-day course of IV Fortaz. On 4/3, CT chest was ordered that indicated an area of alveolar infiltration that may represent pneumonia.  Tracheal aspirate has been sent for culture and is significant for normal respiratory flora. Concerning for aspiration. Completed course of Zosyn. Patient with worsening secretions. Chest x-ray on 6/11 concerning for possible pneumonia. Patient retreated for pneumonia with Cefepime x7 days. Patient has developed worsening secretions. CT chest negative for recurrent pneumonia. Aggressive pulmonary toileting.  Positive blood cultures Tmax of 100 F on 6/19. No clear source, although patient has had multiple episodes of aspiration pneumonia. Blood cultures (6/20) obtained and are positive for  staphylococcus capitis. Repeat blood cultures (6/20)  preliminarily positive for GPC in clusters. Discussed with ID. Repeat cultures with different species of staphylococcus. Likely contaminant. Antibiotics held.   DVT (deep venous thrombosis) ([name]) Duplex ultrasound of lower extremity on 05/15/2021 showed age indeterminate deep vein thrombosis involving the left femoral vein, left proximal profunda vein, left posterior tibial veins, left popliteal vein, and left peroneal veins. -Continue Eliquis   Dysphagia On bolus feeds per dietitian -Aspiration precautions -Continue tube feeds  Emesis Unsure of etiology. Tube feeds held and eventually resumed. Abdominal x-ray without clear etiology. Obtained CT abdomen/pelvis which did not identify an etiology for symptoms. -Zofran PRN   Pulmonary embolus ([name]) Noted to have possible acute on chronic pulmonary embolism on CT.  Also have some evidence of possible pulmonary hypertension.   -Continue Eliquis   Pressure injury of skin Lateral penis, posterior right heel, right foot. Not present on admission.   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 Per neuro notes, suppressing his myoclonus will not ultimately change the prognosis. -Continue Keppra, Klonopin  Peripheral edema -not very notable for me today, follow  -Strict in/out     DVT prophylaxis: Lovenox Code Status:   Code Status: Full Code Family Communication: None. Disposition Plan: Discharge home pending insurance, home health/services availability vs SNF. TOC working on possible home care and private duty. No SNF offers at this time.   Consultants:  Infectious disease Palliative care Neurology Cardiology Critical  care  Procedures:  Tracheostomy PEG tube  Antimicrobials: Unasyn Cefepime Ceftazidime Vancomycin Cefazolin   Subjective:  Nonverbal    Objective: BP 123/82   Pulse 85   Temp 98.5 F (36.9 C) (Oral)   Resp 20   Ht 5\' 10"  (1.778 m)   Wt 95 kg   SpO2 97%   BMI 30.05 kg/m   Examination:  General: No acute distress. Cardiovascular: RRR Lungs: unlabored, trach Abdomen: Soft, nontender, nondistended - peg Neurological: pupils fixed and nonreactive, doesn't respond to voice or physical touch for me Extremities: No clubbing or cyanosis. No edema.  Data Reviewed: I have personally reviewed following labs and imaging studies  CBC Lab Results  Component Value Date   WBC 10.4 08/22/2021   RBC 4.09 (L) 08/22/2021   HGB 12.4 (L) 08/22/2021   HCT 38.7 (L) 08/22/2021   MCV 94.6 08/22/2021   MCH 30.3 08/22/2021   PLT 263 08/22/2021   MCHC 32.0 08/22/2021   RDW 15.0 08/22/2021   LYMPHSABS 1.6 08/22/2021   MONOABS 1.5 (H) 08/22/2021   EOSABS 0.1 08/22/2021   BASOSABS 0.0 08/22/2021     Last metabolic panel Lab Results  Component Value Date   NA 140 08/21/2021   K 4.4 08/21/2021   CL 103 08/21/2021   CO2 30 08/21/2021   BUN 42 (H) 08/21/2021   CREATININE 0.64 08/21/2021   GLUCOSE 147 (H) 08/21/2021   GFRNONAA >60 08/21/2021   CALCIUM 9.4 08/21/2021   PHOS 3.2 08/21/2021   PROT 6.8 08/21/2021   ALBUMIN 2.4 (L) 08/21/2021   BILITOT 0.3 08/21/2021   ALKPHOS 58 08/21/2021   AST 18 08/21/2021   ALT 14 08/21/2021   ANIONGAP 7 08/21/2021    GFR: Estimated Creatinine Clearance: 127.8 mL/min (by C-G formula based on SCr of 0.64 mg/dL).   Radiology Studies: No results found.    LOS: 156 days  Total time spent: 25 minutes

## 2021-09-03 NOTE — Progress Notes (Signed)
PROGRESS NOTE    Billy Cherry  AST:[id number] DOB: 31-Dec-1970 DOA: 03/30/2021 PCP: Patient, No Pcp Per   Brief Narrative: [name] 51 years old male with past medical history of hypertension who was brought into the hospital after being found unresponsive by his wife.  Patient was initially admitted to [facility] hospital for out of hospital cardiac arrest and ventricular fibrillation with respiratory arrest suspected from angioedema from ACE inhibitor leading to asphyxiation.  Patient was then subsequently transferred to [facility].  Below is the Sequence of events during hospitalization,  1/19 left heart cath without significant CAD.  Concern for anoxic brain injury 1/21: MRI shows areas of hypoxic-ischemic injury bilaterally. Fevered; cultures obtained. 1/23 severe brain damage 1/25: PEG . Neuro exam unchanged.  Does exhibit new eyelid tremor like movements and lip/jaw tremor like vs clonus movement.  Bolused Keppra and increased maintenance dose.  Repeat EEG. CTH: concerning for progression of anoxic injury.  1/26: Repeat EEG unchanged and continues to show myoclonic seizures despite addition of Valproic acid yesterday.  03/30/2021 Transfer to [facility] from [facility] for continuous EEG 1/28: Continues to have myoclonic sz on EEG 2/3 no meaningful neuro recovery. Diuresed 2/6 New fever to 102, WBC 16K - treated with vancomycin for 7d based on cultures.  2/11: percutaneous tracheostomy 2/13 >2/20 on trach collar. No vent.  2/20 Trach change to 6 cuffless 2/28: fever 3/13: eliquis started 4/3: fever 06/06/2021 CT of the chest with new PE and haziness consistent with pneumonia 4/5-4/9: responding well to PRN lasix 4/11 fever again after completion of abx- work up in progress 4/13 continues to have low-grade temps cultures from 4/11 no growth to date-suspect silent/occult aspiration-heparin changed back to Eliquis 4/13 4/14: Work-up negative with no growth to date but red  menance Tmax elevated 99.4 and respiratory rate up-x-ray did not show         any pneumonia but probably silently aspirating 08/04/21- ongoing.  Patient remains unresponsive.  Awaiting for placement and disposition has been difficult 6/20 -> difficult placement team working on options, [facility]? 6/21 > 6/27: positive blood cultures (staph capitis/epidermidis), likely contamination. No antibiotics initiated.   Assessment and Plan:  S/p Cardiac arrest Postarrest anoxic encephalopathy Postarrest respiratory failure s/p tracheostomy S/P PEG tube placement 2D echocardiogram showed reduced LV function at <20% with global hypokinesis.  Now status post trach and PEG.  Remains obtunded with anoxic encephalopathy. -Continue Keppra  Anoxic encephalopathy ([name]) MRI brain significant for hypoxic brain injury. Continue supportive care. Patient currently keeps his eyes open, but does not answer questions or follow commands. Unchanged neurologic status   Acute respiratory failure with hypoxia Status post trach collar. Continue supportive care. Now chronic. Secretions noted with RT today, follow   RLL pneumonia Previously had completed vancomycin for MSSA and [name] in early march.  Received more than 7-day course of IV Fortaz. On 4/3, CT chest was ordered that indicated an area of alveolar infiltration that may represent pneumonia.  Tracheal aspirate has been sent for culture and is significant for normal respiratory flora. Concerning for aspiration. Completed course of Zosyn. Patient with worsening secretions. Chest x-ray on 6/11 concerning for possible pneumonia. Patient retreated for pneumonia with Cefepime x7 days. Patient has developed worsening secretions. CT chest negative for recurrent pneumonia. Aggressive pulmonary toileting.  Positive blood cultures Tmax of 100 F on 6/19. No clear source, although patient has had multiple episodes of aspiration pneumonia. Blood cultures  (6/20) obtained and are positive for  staphylococcus capitis. Repeat blood cultures (6/20) preliminarily positive for GPC in clusters. Discussed with ID. Repeat cultures with different species of staphylococcus. Likely contaminant. Antibiotics held.   DVT (deep venous thrombosis) ([name]) Duplex ultrasound of lower extremity on 05/15/2021 showed age indeterminate deep vein thrombosis involving the left femoral vein, left proximal profunda vein, left posterior tibial veins, left popliteal vein, and left peroneal veins. -Continue Eliquis   Dysphagia On bolus feeds per dietitian -Aspiration precautions -Continue tube feeds  Emesis Unsure of etiology. Tube feeds held and eventually resumed. Abdominal x-ray without clear etiology. Obtained CT abdomen/pelvis which did not identify an etiology for symptoms. -Zofran PRN   Pulmonary embolus ([name]) Noted to have possible acute on chronic pulmonary embolism on CT.  Also have some evidence of possible pulmonary hypertension.   -Continue Eliquis   Pressure injury of skin Lateral penis, posterior right heel, right foot. Not present on admission.   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 Per neuro notes, suppressing his myoclonus will not ultimately change the prognosis. -Continue Keppra, Klonopin  Peripheral edema -not very notable for me today, follow  -Strict in/out     DVT prophylaxis: Lovenox Code Status:   Code Status: Full Code Family Communication: None. Disposition Plan: Discharge home pending insurance, home health/services availability vs SNF. TOC working on possible home care and private duty. No SNF offers at this time.   Consultants:  Infectious disease Palliative  care Neurology Cardiology Critical care  Procedures:  Tracheostomy PEG tube  Antimicrobials: Unasyn Cefepime Ceftazidime Vancomycin Cefazolin   Subjective:  Nonverbal    Objective: BP (!) 143/109 (BP Location: Left Arm)   Pulse 88   Temp 98.2 F (36.8 C)   Resp 18   Ht 5\' 10"  (1.778 m)   Wt 95 kg   SpO2 95%   BMI 30.05 kg/m   Examination:  General: No acute distress. Cardiovascular: RRR Lungs: secretions noted, respiratory suctioning, unlabored - trach mask Neurological: nonverbal, not following commands, eyes open, but no meaningful movements Extremities: No clubbing or cyanosis. No edema.  Data Reviewed: I have personally reviewed following labs and imaging studies  CBC Lab Results  Component Value Date   WBC 10.4 08/22/2021   RBC 4.09 (L) 08/22/2021   HGB 12.4 (L) 08/22/2021   HCT 38.7 (L) 08/22/2021   MCV 94.6 08/22/2021   MCH 30.3 08/22/2021   PLT 263 08/22/2021   MCHC 32.0 08/22/2021   RDW 15.0 08/22/2021   LYMPHSABS 1.6 08/22/2021   MONOABS 1.5 (H) 08/22/2021   EOSABS 0.1 08/22/2021   BASOSABS 0.0 [id number]     Last metabolic panel Lab Results  Component Value Date   NA 140 08/21/2021   K 4.4 08/21/2021   CL 103 08/21/2021   CO2 30 08/21/2021   BUN 42 (H) 08/21/2021   CREATININE 0.64 08/21/2021   GLUCOSE 147 (H) 08/21/2021   GFRNONAA >60 08/21/2021   CALCIUM 9.4 08/21/2021   PHOS 3.2 08/21/2021   PROT 6.8 08/21/2021   ALBUMIN 2.4 (L) 08/21/2021   BILITOT 0.3 08/21/2021   ALKPHOS 58 08/21/2021   AST 18 08/21/2021   ALT 14 08/21/2021   ANIONGAP 7 08/21/2021    GFR: Estimated Creatinine Clearance: 127.8 mL/min (by C-G formula based on SCr of 0.64 mg/dL).   Radiology Studies: No results  found.    LOS: 157 days   Total time spent: 25 minutes

## 2021-09-04 ENCOUNTER — Inpatient Hospital Stay (HOSPITAL_COMMUNITY): Payer: BC Managed Care – PPO

## 2021-09-04 NOTE — TOC Progression Note (Addendum)
Transition of Care [facility]) - Progression Note    Patient Details  Name: Billy Cherry MRN: [id number] Date of Birth: 09/16/1970  Transition of Care [facility]) CM/SW Contact  Janae Bridgeman, RN Phone Number: 09/04/2021, 9:30 AM  Clinical Narrative:    CM called and left a message with Jess Barters, CM with [facility] [phone] to review update regarding staffing for Private duty in the home - No discharge date has been determined at this time.  09/04/2021 1015 - CM called and spoke with Jess Barters, CM with [facility] and he states that [facility] has established full-time nursing support at the home that will be available for start of care in the home next week.  I spoke with Dr. Lowell Guitar and bedside nursing and they are aware that patient would likely discharge to home next week through Private duty nursing support.  DME is available at the home - CM will reach out to [employer] closed to discharge date so that air mattress can be delivered to the home.  CM and MSW with DTP Team will continue to follow the patient for discharge planning to home.   Expected Discharge Plan: Home w Home Health Services Barriers to Discharge: Other (must enter comment) (Patient's wife - requested private duty services through alternative provider for 12 hours shifts for staffing at the home.)  Expected Discharge Plan and Services Expected Discharge Plan: Home w Home Health Services In-house Referral: Clinical Social Work Discharge Planning Services: CM Consult, Follow-up appt scheduled Post Acute Care Choice: Home Health Living arrangements for the past 2 months: Single Family Home Expected Discharge Date: 08/07/21               DME Arranged: Janina Mayo supplies, Tube feeding, Suction, Hospital bed, Oxygen DME Agency: [employer]       HH Arranged: RN (Private Duty Nursing through [employer] versus [facility]) HH Agency: [employer] [facility] Date HH Agency Contacted: 07/04/21 Time HH Agency Contacted:  1000 Representative spoke with at [facility] Agency: Jeanmarie Plant, Director with [employer] [phone]   Social Determinants of Health (SDOH) Interventions    Readmission Risk Interventions    04/14/2021   11:14 AM  Readmission Risk Prevention Plan  Transportation Screening Complete  HRI or Home Care Consult Complete  Social Work Consult for Recovery Care Planning/Counseling Complete  Palliative Care Screening Complete  Medication Review [employer]) Complete

## 2021-09-04 NOTE — Plan of Care (Signed)
  Problem: Nutrition: Goal: Adequate nutrition will be maintained Outcome: Progressing   Problem: Safety: Goal: Ability to remain free from injury will improve Outcome: Progressing   Problem: Skin Integrity: Goal: Risk for impaired skin integrity will decrease Outcome: Progressing   Problem: Activity: Goal: Risk for activity intolerance will decrease Outcome: Not Progressing   Problem: Coping: Goal: Level of anxiety will decrease Outcome: Not Progressing   Problem: Elimination: Goal: Will not experience complications related to bowel motility Outcome: Not Progressing Goal: Will not experience complications related to urinary retention Outcome: Not Progressing

## 2021-09-04 NOTE — Progress Notes (Signed)
PROGRESS NOTE    Billy Cherry  RCV:[id number] DOB: [birth year] DOA: 03/30/2021 PCP: Patient, No Pcp Per   Brief Narrative: [name] 52 years old male with past medical history of hypertension who was brought into the hospital after being found unresponsive by his wife.  Patient was initially admitted to [facility] hospital for out of hospital cardiac arrest and ventricular fibrillation with respiratory arrest suspected from angioedema from ACE inhibitor leading to asphyxiation.  Patient was then subsequently transferred to [facility].  Below is the Sequence of events during hospitalization,  1/19 left heart cath without significant CAD.  Concern for anoxic brain injury 1/21: MRI shows areas of hypoxic-ischemic injury bilaterally. Fevered; cultures obtained. 1/23 severe brain damage 1/25: PEG . Neuro exam unchanged.  Does exhibit new eyelid tremor like movements and lip/jaw tremor like vs clonus movement.  Bolused Keppra and increased maintenance dose.  Repeat EEG. CTH: concerning for progression of anoxic injury.  1/26: Repeat EEG unchanged and continues to show myoclonic seizures despite addition of Valproic acid yesterday.  03/30/2021 Transfer to [facility] from [place] for continuous EEG 1/28: Continues to have myoclonic sz on EEG 2/3 no meaningful neuro recovery. Diuresed 2/6 New fever to 102, WBC 16K - treated with vancomycin for 7d based on cultures.  2/11: percutaneous tracheostomy 2/13 >2/20 on trach collar. No vent.  2/20 Trach change to 6 cuffless 2/28: fever 3/13: eliquis started 4/3: fever 06/06/2021 CT of the chest with new PE and haziness consistent with pneumonia 4/5-4/9: responding well to PRN lasix 4/11 fever again after completion of abx- work up in progress 4/13 continues to have low-grade temps cultures from 4/11 no growth to date-suspect silent/occult aspiration-heparin changed back to Eliquis 4/13 4/14: Work-up negative with no growth to date but red  menance Tmax elevated 99.4 and respiratory rate up-x-ray did not show         any pneumonia but probably silently aspirating 08/04/21- ongoing.  Patient remains unresponsive.  Awaiting for placement and disposition has been difficult 6/20 -> difficult placement team working on options, [facility]? 6/21 > 6/27: positive blood cultures (staph capitis/epidermidis), likely contamination. No antibiotics initiated.   Assessment and Plan:  S/p Cardiac arrest Postarrest anoxic encephalopathy Postarrest respiratory failure s/p tracheostomy S/P PEG tube placement 2D echocardiogram showed reduced LV function at <20% with global hypokinesis.  Now status post trach and PEG.  Remains obtunded with anoxic encephalopathy. -Continue Keppra  Anoxic encephalopathy ([name]) MRI brain significant for hypoxic brain injury. Continue supportive care. Patient currently keeps his eyes open, but does not answer questions or follow commands. Unchanged neurologic status   Acute respiratory failure with hypoxia Status post trach collar. Continue supportive care. Now chronic. Thick secretions, desatted overnight related to secretions, improved now Follow CXR   RLL pneumonia Previously had completed vancomycin for MSSA and [name] in early march.  Received more than 7-day course of IV Fortaz. On 4/3, CT chest was ordered that indicated an area of alveolar infiltration that may represent pneumonia.  Tracheal aspirate has been sent for culture and is significant for normal respiratory flora. Concerning for aspiration. Completed course of Zosyn. Patient with worsening secretions. Chest x-ray on 6/11 concerning for possible pneumonia. Patient retreated for pneumonia with Cefepime x7 days. Patient has developed worsening secretions. CT chest negative for recurrent pneumonia. Aggressive pulmonary toileting.  Positive blood cultures Tmax of 100 F on 6/19. No clear source, although patient has had multiple episodes  of aspiration pneumonia. Blood cultures (6/20)  obtained and are positive for staphylococcus capitis. Repeat blood cultures (6/20) preliminarily positive for GPC in clusters. Discussed with ID. Repeat cultures with different species of staphylococcus. Likely contaminant. Antibiotics held.   DVT (deep venous thrombosis) ([name]) Duplex ultrasound of lower extremity on 05/15/2021 showed age indeterminate deep vein thrombosis involving the left femoral vein, left proximal profunda vein, left posterior tibial veins, left popliteal vein, and left peroneal veins. -Continue Eliquis   Dysphagia On bolus feeds per dietitian -Aspiration precautions -Continue tube feeds  Emesis Unsure of etiology. Tube feeds held and eventually resumed. Abdominal x-ray without clear etiology. Obtained CT abdomen/pelvis which did not identify an etiology for symptoms. -Zofran PRN   Pulmonary embolus ([name]) Noted to have possible acute on chronic pulmonary embolism on CT.  Also have some evidence of possible pulmonary hypertension.   -Continue Eliquis   Pressure injury of skin Lateral penis, posterior right heel, right foot. Not present on admission.   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 Per neuro notes, suppressing his myoclonus will not ultimately change the prognosis. -Continue Keppra, Klonopin  Peripheral edema -not very notable for me today, follow  -Strict in/out     DVT prophylaxis: Lovenox Code Status:   Code Status: Full Code Family Communication: None. Disposition Plan: Discharge home pending insurance, home health/services availability vs SNF. TOC working on possible home care and private duty. No SNF offers at this time.   Consultants:   Infectious disease Palliative care Neurology Cardiology Critical care  Procedures:  Tracheostomy PEG tube  Antimicrobials: Unasyn Cefepime Ceftazidime Vancomycin Cefazolin   Subjective:  Nonverbal    Objective: BP (!) 119/93 (BP Location: Right Arm)   Pulse 88   Temp 98.4 F (36.9 C) (Oral)   Resp 18   Ht 5\' 10"  (1.778 m)   Wt 95 kg   SpO2 99%   BMI 30.05 kg/m   Examination:  General: No acute distress. Cardiovascular: RRR Lungs: unlabored, trach collar in palce Abdomen: peg Neurological: not tracking, no meaningful movements Skin: Warm and dry. Extremities: No clubbing or cyanosis. No edema.   Data Reviewed: I have personally reviewed following labs and imaging studies  CBC Lab Results  Component Value Date   WBC 10.4 08/22/2021   RBC 4.09 (L) 08/22/2021   HGB 12.4 (L) 08/22/2021   HCT 38.7 (L) 08/22/2021   MCV 94.6 08/22/2021   MCH 30.3 08/22/2021   PLT 263 08/22/2021   MCHC 32.0 08/22/2021   RDW 15.0 08/22/2021   LYMPHSABS 1.6 08/22/2021   MONOABS 1.5 (H) 08/22/2021   EOSABS 0.1 08/22/2021   BASOSABS 0.0 08/22/2021     Last metabolic panel Lab Results  Component Value Date   NA 140 08/21/2021   K 4.4 08/21/2021   CL 103 08/21/2021   CO2 30 08/21/2021   BUN 42 (H) 08/21/2021   CREATININE 0.64 08/21/2021   GLUCOSE 147 (H) 08/21/2021   GFRNONAA >60 08/21/2021   CALCIUM 9.4 08/21/2021   PHOS 3.2 08/21/2021   PROT 6.8 08/21/2021   ALBUMIN 2.4 (L) 08/21/2021   BILITOT 0.3 08/21/2021   ALKPHOS 58 08/21/2021   AST 18 08/21/2021   ALT 14 08/21/2021   ANIONGAP 7 08/21/2021    GFR: Estimated Creatinine Clearance: 127.8 mL/min (by C-G formula based on SCr of 0.64 mg/dL).  Radiology Studies: No results found.    LOS: 158 days   Total time spent: 25 minutes

## 2021-09-04 NOTE — Progress Notes (Signed)
NAME:  Billy Cherry, MRN:  [id number], DOB:  [birth year], LOS: 158 ADMISSION DATE:  03/30/2021, CONSULTATION DATE:  1/19 REFERRING MD:  Evette Georges, CHIEF COMPLAINT:  Found down, cardiac arrest   Brief Pt Description / Synopsis:  51 year old male with out-of-hospital V. fib cardiac arrest in setting of respiratory arrest due to suspected angioedema from ACE inhibitor leading to asphyxiation.  Concern on his 12 lead EKG for anterolateral ST elevation so he was taken to the cath lab where his study showed no significant coronary artery disease and an LVEF < 20%  Now with anoxic brain injury.  Pertinent  Medical History  Hypertension  Significant Hospital Events: Including procedures, antibiotic start and stop dates in addition to other pertinent events   1/19 admission, left heart cath without significant CAD.  Concern for anoxic brain injury 1/20: MRI pending, Neuro following 1/21: MRI shows areas of hypoxic-ischemic injury bilaterally. Fevered; cultures obtained. 1/23 severe brain damage 1/24 evidence of severe brain damage, remains critically ill 1/25: Plan for PEG today. Neuro exam unchanged.  Does exhibit new eyelid tremor like movements and lip/jaw tremor like vs clonus movement.  Bolused Keppra and increased maintenance dose.  Repeat EEG. CTH: concerning for progression of anoxic injury.  1/26: Repeat EEG unchanged and continues to show myoclonic seizures despite addition of Valproic acid yesterday. transfer to [facility] for continuous EEG 1/28: Continues to have myoclonic sz on EEG 1/29 No acute issues overnight 2/3 no meaningful neuro recovery. Diuresed 2/5 unchanged 2/6 New fever to 102, WBC 16K - treated with vancomycin for 7d based on cultures.  2/11: percutaneous tracheostomy 2/13 >2/20 on trach collar. No vent.  2/20 Trach change to 6 cuffless 2/27 status unchanged 3/13 unchanged 3/20 due for trach exchange given continue secretions (although improved) will stick to 6 cuffless  for exchange  06/05/2021 Continued thick  secretions, scant blood tinged 4/3/am 06/06/2021 CT of the chest with new PE and haziness consistent with pneumonia 5/30 continue tube feeding/free water  Interim History / Subjective:  No events, remains comatose, small thick secretions.  Objective   Blood pressure (!) 147/103, pulse 91, temperature 98.4 F (36.9 C), temperature source Oral, resp. rate 18, height 5\' 10"  (1.778 m), weight 95 kg, SpO2 97 %.    FiO2 (%):  [21 %] 21 %   Intake/Output Summary (Last 24 hours) at 09/04/2021 0851 Last data filed at 09/04/2021 0600 Gross per 24 hour  Intake 1381 ml  Output 1701 ml  Net -320 ml     Filed Weights   08/29/21 0343 08/31/21 0500 09/01/21 0500  Weight: 95 kg 96 kg 95 kg   Examination:  No distress Thick secretions Lungs with scattered rhonci Eyes are open not tracking, not following commands, no motor response Heart sounds regular, ext warm  No new chest imaging  Assessment & Plan:  Out of hospital V-fib arrest Postarrest anoxic brain injury Myoclonus Respiratory failure with prolonged mechanical ventilation Tracheostomy dependent Cardiomyopathy Pulmonary embolism Hypertension  -Trach care per protocol and suctioning protocol; unchanged recs this week - Not a candidate for decannulation -Continue atropine drops, Glycopyrrolate per tube, scopolamine patch -Continue chest PT -Con't eliquis; anticipate he will need indefinitely -Overall prognosis remains poor.  Rest per primary team. PCCM will continue to follow weekly. Please call if needs arise sooner.   09/03/21 MD PCCM [facility] Office: [phone]   See Amion for personal pager PCCM on call pager [phone] until 7pm. Please call Elink 7p-7a. [phone]

## 2021-09-05 LAB — CBC WITH DIFFERENTIAL/PLATELET
Abs Immature Granulocytes: 0.03 10*3/uL (ref 0.00–0.07)
Basophils Absolute: 0 10*3/uL (ref 0.0–0.1)
Basophils Relative: 0 %
Eosinophils Absolute: 0.1 10*3/uL (ref 0.0–0.5)
Eosinophils Relative: 2 %
HCT: 38 % — ABNORMAL LOW (ref 39.0–52.0)
Hemoglobin: 12.3 g/dL — ABNORMAL LOW (ref 13.0–17.0)
Immature Granulocytes: 0 %
Lymphocytes Relative: 22 %
Lymphs Abs: 1.7 10*3/uL (ref 0.7–4.0)
MCH: 30.4 pg (ref 26.0–34.0)
MCHC: 32.4 g/dL (ref 30.0–36.0)
MCV: 93.8 fL (ref 80.0–100.0)
Monocytes Absolute: 1 10*3/uL (ref 0.1–1.0)
Monocytes Relative: 14 %
Neutro Abs: 4.6 10*3/uL (ref 1.7–7.7)
Neutrophils Relative %: 62 %
Platelets: 257 10*3/uL (ref 150–400)
RBC: 4.05 MIL/uL — ABNORMAL LOW (ref 4.22–5.81)
RDW: 15.8 % — ABNORMAL HIGH (ref 11.5–15.5)
WBC: 7.4 10*3/uL (ref 4.0–10.5)
nRBC: 0 % (ref 0.0–0.2)

## 2021-09-05 LAB — COMPREHENSIVE METABOLIC PANEL
ALT: 19 U/L (ref 0–44)
AST: 21 U/L (ref 15–41)
Albumin: 2.4 g/dL — ABNORMAL LOW (ref 3.5–5.0)
Alkaline Phosphatase: 59 U/L (ref 38–126)
Anion gap: 10 (ref 5–15)
BUN: 25 mg/dL — ABNORMAL HIGH (ref 6–20)
CO2: 27 mmol/L (ref 22–32)
Calcium: 9.3 mg/dL (ref 8.9–10.3)
Chloride: 102 mmol/L (ref 98–111)
Creatinine, Ser: 0.71 mg/dL (ref 0.61–1.24)
GFR, Estimated: >60 mL/min (ref >60)
Glucose, Bld: 106 mg/dL — ABNORMAL HIGH (ref 70–99)
Potassium: 4.7 mmol/L (ref 3.5–5.1)
Sodium: 139 mmol/L (ref 135–145)
Total Bilirubin: 0.4 mg/dL (ref 0.3–1.2)
Total Protein: 6.3 g/dL — ABNORMAL LOW (ref 6.5–8.1)

## 2021-09-05 LAB — PHOSPHORUS: Phosphorus: 4.4 mg/dL (ref 2.5–4.6)

## 2021-09-05 LAB — MAGNESIUM: Magnesium: 1.9 mg/dL (ref 1.7–2.4)

## 2021-09-05 MED ORDER — IPRATROPIUM-ALBUTEROL 0.5-2.5 (3) MG/3ML IN SOLN
3.0000 mL | Freq: Three times a day (TID) | RESPIRATORY_TRACT | Status: DC
  Administered 2021-09-05 – 2021-09-06 (×2): 3 mL via RESPIRATORY_TRACT
  Filled 2021-09-05 (×2): qty 3

## 2021-09-05 NOTE — Progress Notes (Signed)
PROGRESS NOTE    Billy Cherry  [id number] DOB: [birth year] DOA: 03/30/2021 PCP: Patient, No Pcp Per   Brief Narrative: [name] 51 years old male with past medical history of hypertension who was brought into the hospital after being found unresponsive by his wife.  Patient was initially admitted to [facility] hospital for out of hospital cardiac arrest and ventricular fibrillation with respiratory arrest suspected from angioedema from ACE inhibitor leading to asphyxiation.  Patient was then subsequently transferred to [facility].  Below is the Sequence of events during hospitalization,  1/19 left heart cath without significant CAD.  Concern for anoxic brain injury 1/21: MRI shows areas of hypoxic-ischemic injury bilaterally. Fevered; cultures obtained. 1/23 severe brain damage 1/25: PEG . Neuro exam unchanged.  Does exhibit new eyelid tremor like movements and lip/jaw tremor like vs clonus movement.  Bolused Keppra and increased maintenance dose.  Repeat EEG. CTH: concerning for progression of anoxic injury.  1/26: Repeat EEG unchanged and continues to show myoclonic seizures despite addition of Valproic acid yesterday.  03/30/2021 Transfer to [name] from [place] for continuous EEG 1/28: Continues to have myoclonic sz on EEG 2/3 no meaningful neuro recovery. Diuresed 2/6 New fever to 102, WBC 16K - treated with vancomycin for 7d based on cultures.  2/11: percutaneous tracheostomy 2/13 >2/20 on trach collar. No vent.  2/20 Trach change to 6 cuffless 2/28: fever 3/13: eliquis started 4/3: fever 06/06/2021 CT of the chest with new PE and haziness consistent with pneumonia 4/5-4/9: responding well to PRN lasix 4/11 fever again after completion of abx- work up in progress 4/13 continues to have low-grade temps cultures from 4/11 no growth to date-suspect silent/occult aspiration-heparin changed back to Eliquis 4/13 4/14: Work-up negative with no growth to date but red  menance Tmax elevated 99.4 and respiratory rate up-x-ray did not show         any pneumonia but probably silently aspirating 08/04/21- ongoing.  Patient remains unresponsive.  Awaiting for placement and disposition has been difficult 6/20 -> difficult placement team working on options, [facility]? 6/21 > 6/27: positive blood cultures (staph capitis/epidermidis), likely contamination. No antibiotics initiated.   Assessment and Plan:  S/p Cardiac arrest Postarrest anoxic encephalopathy Postarrest respiratory failure s/p tracheostomy S/P PEG tube placement 2D echocardiogram showed reduced LV function at <20% with global hypokinesis.  Now status post trach and PEG.  Remains obtunded with anoxic encephalopathy. -Continue Keppra  Anoxic encephalopathy ([place]) MRI brain significant for hypoxic brain injury. Continue supportive care. Patient currently keeps his eyes open, but does not answer questions or follow commands. Unchanged neurologic status   Acute respiratory failure with hypoxia Status post trach collar. Continue supportive care. Now chronic. Lots of secretions, continue atropine, scopalamine, robinul.  Will try duonebs as well (discussed with pulm) Follow CXR with mild patchy density at both lung bases (atelectasis vs mild patchy basilar pneumonia) - similar to study 2 weeks ago - afebrile and no leukocytosis, will monitor for now   RLL pneumonia Previously had completed vancomycin for MSSA and [name] in early march.  Received more than 7-day course of IV Fortaz. On 4/3, CT chest was ordered that indicated an area of alveolar infiltration that may represent pneumonia.  Tracheal aspirate has been sent for culture and is significant for normal respiratory flora. Concerning for aspiration. Completed course of Zosyn. Patient with worsening secretions. Chest x-ray on 6/11 concerning for possible pneumonia. Patient retreated for pneumonia with Cefepime x7 days. Patient has  developed worsening secretions. CT chest negative for recurrent pneumonia. Aggressive pulmonary toileting.  Positive blood cultures Tmax of 100 F on 6/19. No clear source, although patient has had multiple episodes of aspiration pneumonia. Blood cultures (6/20) obtained and are positive for staphylococcus capitis. Repeat blood cultures (6/20) preliminarily positive for GPC in clusters. Discussed with ID. Repeat cultures with different species of staphylococcus. Likely contaminant. Antibiotics held.   DVT (deep venous thrombosis) ([name]) Duplex ultrasound of lower extremity on 05/15/2021 showed age indeterminate deep vein thrombosis involving the left femoral vein, left proximal profunda vein, left posterior tibial veins, left popliteal vein, and left peroneal veins. -Continue Eliquis   Dysphagia On bolus feeds per dietitian -Aspiration precautions -Continue tube feeds  Emesis Unsure of etiology. Tube feeds held and eventually resumed. Abdominal x-ray without clear etiology. Obtained CT abdomen/pelvis which did not identify an etiology for symptoms. -Zofran PRN   Pulmonary embolus ([name]) Noted to have possible acute on chronic pulmonary embolism on CT.  Also have some evidence of possible pulmonary hypertension.   -Continue Eliquis   Pressure injury of skin Lateral penis, posterior right heel, right foot. Not present on admission.   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 Per neuro notes, suppressing his myoclonus will not ultimately change the prognosis. -Continue Keppra, Klonopin  Peripheral edema -not very notable for me today, follow  -Strict in/out     DVT prophylaxis: Lovenox Code Status:   Code Status: Full  Code Family Communication: None. Disposition Plan: Discharge home pending insurance, home health/services availability vs SNF. TOC working on possible home care and private duty. No SNF offers at this time.   Consultants:  Infectious disease Palliative care Neurology Cardiology Critical care  Procedures:  Tracheostomy PEG tube  Antimicrobials: Unasyn Cefepime Ceftazidime Vancomycin Cefazolin   Subjective:  Nonverbal    Objective: BP (!) 117/93 (BP Location: Right Arm)   Pulse 91   Temp 98.8 F (37.1 C) (Oral)   Resp 18   Ht 5\' 10"  (1.778 m)   Wt 97 kg   SpO2 93%   BMI 30.68 kg/m   Examination:  General: coughing with suctioning Cardiovascular: RRR Lungs: trach, coughing with suctioning Neurological: not following commands, no meaningful movements, eyes open, not tracking Extremities: No clubbing or cyanosis. No edema.   Data Reviewed: I have personally reviewed following labs and imaging studies  CBC Lab Results  Component Value Date   WBC 7.4 09/05/2021   RBC 4.05 (L) 09/05/2021   HGB 12.3 (L) 09/05/2021   HCT 38.0 (L) 09/05/2021   MCV 93.8 09/05/2021   MCH 30.4 09/05/2021   PLT 257 09/05/2021   MCHC 32.4 09/05/2021   RDW 15.8 (H) 09/05/2021   LYMPHSABS 1.7 09/05/2021   MONOABS 1.0 09/05/2021   EOSABS 0.1 09/05/2021   BASOSABS 0.0 09/05/2021     Last metabolic panel Lab Results  Component Value Date   NA 139 09/05/2021   K 4.7 09/05/2021   CL 102 09/05/2021   CO2 27 09/05/2021   BUN 25 (H) 09/05/2021   CREATININE 0.71 09/05/2021   GLUCOSE 106 (H) 09/05/2021   GFRNONAA >60 09/05/2021   CALCIUM 9.3 09/05/2021   PHOS 4.4 09/05/2021   PROT 6.3 (L) 09/05/2021   ALBUMIN 2.4 (L) 09/05/2021   BILITOT 0.4 09/05/2021   ALKPHOS  59 09/05/2021   AST 21 09/05/2021   ALT 19 09/05/2021   ANIONGAP 10 09/05/2021    GFR: Estimated Creatinine Clearance: 129.1 mL/min (by C-G formula based on SCr of 0.71 mg/dL).   Radiology Studies: DG  CHEST PORT 1 VIEW  Result Date: 09/04/2021 CLINICAL DATA:  Hypoxia EXAM: PORTABLE CHEST 1 VIEW COMPARISON:  08/22/2021 FINDINGS: Cardiomegaly with left ventricular prominence. Tracheostomy in place. Mild patchy density at the lung bases could be atelectasis or mild patchy basilar pneumonia. No lobar consolidation or collapse. No effusion. No abnormal bone finding. IMPRESSION: Cardiomegaly. Tracheostomy in place. Mild patchy density at both lung bases could be atelectasis or mild patchy basilar pneumonia. Similar appearance to the study 2 weeks ago. Electronically Signed   By: Paulina Fusi M.D.   On: 09/04/2021 15:30      LOS: 159 days   Total time spent: 25 minutes

## 2021-09-06 ENCOUNTER — Other Ambulatory Visit (HOSPITAL_COMMUNITY): Payer: Self-pay

## 2021-09-06 MED ORDER — IPRATROPIUM-ALBUTEROL 0.5-2.5 (3) MG/3ML IN SOLN
3.0000 mL | Freq: Two times a day (BID) | RESPIRATORY_TRACT | Status: DC
Start: 2021-09-06 — End: 2021-09-07
  Administered 2021-09-06 – 2021-09-07 (×2): 3 mL via RESPIRATORY_TRACT
  Filled 2021-09-06 (×2): qty 3

## 2021-09-06 NOTE — Progress Notes (Signed)
Progress Note    Billy Cherry   MGQ:[id number]  DOB: [birth year]  DOA: 03/30/2021     160 PCP: Patient, No Pcp Per  Initial CC: found unresponsive   Hospital Course: Patient is a 51 years old male with past medical history of hypertension who was brought into the hospital after being found unresponsive by his wife.  Patient was initially admitted to [facility] hospital for out of hospital cardiac arrest and ventricular fibrillation with respiratory arrest suspected from angioedema from ACE inhibitor leading to asphyxiation.  Patient was then subsequently transferred to [facility].  Below is the Sequence of events during hospitalization,  1/19 left heart cath without significant CAD.  Concern for anoxic brain injury 1/21: MRI shows areas of hypoxic-ischemic injury bilaterally. Fevered; cultures obtained. 1/23 severe brain damage 1/25: PEG . Neuro exam unchanged.  Does exhibit new eyelid tremor like movements and lip/jaw tremor like vs clonus movement.  Bolused Keppra and increased maintenance dose.  Repeat EEG. CTH: concerning for progression of anoxic injury.  1/26: Repeat EEG unchanged and continues to show myoclonic seizures despite addition of Valproic acid yesterday.  03/30/2021 Transfer to [facility] from [place] for continuous EEG 1/28: Continues to have myoclonic sz on EEG 2/3 no meaningful neuro recovery. Diuresed 2/6 New fever to 102, WBC 16K - treated with vancomycin for 7d based on cultures.  2/11: percutaneous tracheostomy 2/13 >2/20 on trach collar. No vent.  2/20 Trach change to 6 cuffless 2/28: fever 3/13: eliquis started 4/3: fever 06/06/2021 CT of the chest with new PE and haziness consistent with pneumonia 4/5-4/9: responding well to PRN lasix 4/11 fever again after completion of abx- work up in progress 4/13 continues to have low-grade temps cultures from 4/11 no growth to date-suspect silent/occult aspiration-heparin changed back to Eliquis 4/13 4/14: Work-up  negative with no growth to date but red menance Tmax elevated 99.4 and respiratory rate up-x-ray did not show         any pneumonia but probably silently aspirating 08/04/21- ongoing.  Patient remains unresponsive.  Awaiting for placement and disposition has been difficult 6/20 -> difficult placement team working on options, [facility]? 6/21 > 6/27: positive blood cultures (staph capitis/epidermidis), likely contamination. No antibiotics initiated.  Interval History:  No events overnight. Laying in bed with nonpurposeful eye movements. Does not react to stimuli.   Assessment and Plan:  S/p Cardiac arrest Postarrest anoxic encephalopathy Postarrest respiratory failure s/p tracheostomy S/P PEG tube placement 2D echocardiogram showed reduced LV function at <20% with global hypokinesis.  Now status post trach and PEG.  Remains with anoxic encephalopathy. -Continue Keppra - poor prognosis overall; professional recommendation is transition to hospice/comfort care   Anoxic encephalopathy ([name]) MRI brain significant for hypoxic brain injury.  Nonpurposeful eye movements and unable to interact in any meaningful capacity.  Poor neurologic status has remained unchanged - Patient not likely to have any significant meaningful recovery given prolonged length of hospitalization   Acute respiratory failure with hypoxia Status post trach collar. Now chronic. -Continue treatment for secretions with atropine, scopolamine, and Robinul - at risk for recurrent infections   RLL pneumonia Previously had completed vancomycin for MSSA and [name] in early march.  Received more than 7-day course of IV Fortaz. On 4/3, CT chest was ordered that indicated an area of alveolar infiltration that may represent pneumonia.  Tracheal aspirate has been sent for culture and is significant for normal respiratory flora. Concerning for aspiration. Completed course of Zosyn. Patient with worsening  secretions. Chest  x-ray on 6/11 concerning for possible pneumonia. Patient retreated for pneumonia with Cefepime x7 days. Patient has developed worsening secretions. CT chest negative for recurrent pneumonia. Aggressive pulmonary toileting. - at risk for recurrent infections and imminent decline    Positive blood cultures Tmax of 100 F on 6/19. No clear source, although patient has had multiple episodes of aspiration pneumonia. Blood cultures (6/20) obtained and are positive for staphylococcus capitis. Repeat blood cultures (6/20) preliminarily positive for GPC in clusters. Discussed with ID. Repeat cultures with different species of staphylococcus. Likely contaminant. Antibiotics held.   DVT (deep venous thrombosis) ([name]) Duplex ultrasound of lower extremity on 05/15/2021 showed age indeterminate deep vein thrombosis involving the left femoral vein, left proximal profunda vein, left posterior tibial veins, left popliteal vein, and left peroneal veins. -Continue Eliquis   Dysphagia On bolus feeds per dietitian -Aspiration precautions -Continue tube feeds   Emesis Unsure of etiology. Tube feeds held and eventually resumed. Abdominal x-ray without clear etiology. Obtained CT abdomen/pelvis which did not identify an etiology for symptoms. -Zofran PRN   Pulmonary embolus ([name]) Noted to have possible acute on chronic pulmonary embolism on CT.  Also have some evidence of possible pulmonary hypertension.   -Continue Eliquis   Pressure injury of skin Lateral penis, posterior right heel, right foot. Not present on admission.   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 Per neuro notes, suppressing his myoclonus will not ultimately change the  prognosis. -Continue Keppra, Klonopin   Peripheral edema -not very notable for me today, follow  -Strict in/out    Old records reviewed in assessment of this patient  Antimicrobials:   DVT prophylaxis:   apixaban (ELIQUIS) tablet 5 mg   Code Status:   Code Status: Full Code  Mobility Assessment (last 72 hours)     Mobility Assessment     Row Name 09/06/21 0841 09/05/21 2017 09/05/21 0900 09/04/21 0930     Does patient have an order for bedrest or is patient medically unstable Yes- Bedfast (Level 1) - Complete Yes- Bedfast (Level 1) - Complete Yes- Bedfast (Level 1) - Complete Yes- Bedfast (Level 1) - Complete    What is the highest level of mobility based on the progressive mobility assessment? Level 1 (Bedfast) - Unable to balance while sitting on edge of bed Level 1 (Bedfast) - Unable to balance while sitting on edge of bed Level 1 (Bedfast) - Unable to balance while sitting on edge of bed Level 1 (Bedfast) - Unable to balance while sitting on edge of bed    Is the above level different from baseline mobility prior to current illness? No - Consider discontinuing PT/OT -- No - Consider discontinuing PT/OT No - Consider discontinuing PT/OT             Disposition Plan:  Home with [place]; [place] Status is: Inpt  Objective: Blood pressure (!) 123/97, pulse 96, temperature 98.2 F (36.8 C), temperature source Oral, resp. rate (!) 22, height 5\' 10"  (1.778 m), weight 97 kg, SpO2 98 %.  Examination:  Physical Exam Constitutional:      Comments: Chronically ill appearing gentleman laying in bed with no purposeful movements and unable to interact   HENT:     Head: Normocephalic.     Mouth/Throat:  Mouth: Mucous membranes are moist.  Eyes:     Pupils: Pupils are equal, round, and reactive to light.  Neck:     Comments: Trach in place Cardiovascular:     Rate and Rhythm: Normal rate and regular rhythm.  Pulmonary:     Comments: Trach in place with coarse  sounds Abdominal:     General: Bowel sounds are normal.     Palpations: Abdomen is soft.     Comments: PEG in place  Musculoskeletal:        General: No swelling.     Cervical back: No rigidity.  Skin:    General: Skin is warm.  Neurological:     Comments: Possible mild w/d to pain in LLE vs reflex. Does not w/d to pain otherwise, does not interact or follow commands. Does not track with eyes       Consultants:    Procedures:    Data Reviewed: No results found for this or any previous visit (from the past 24 hour(s)).  I have Reviewed nursing notes, Vitals, and Lab results since pt's last encounter. Pertinent lab results : see above I have reviewed the last note from staff over past 24 hours I have discussed pt's care plan and test results with nursing staff, case manager   LOS: 160 days   Lewie Chamber, MD [facility] 09/06/2021, 3:29 PM

## 2021-09-07 MED ORDER — IPRATROPIUM-ALBUTEROL 0.5-2.5 (3) MG/3ML IN SOLN: 3.0000 mL | RESPIRATORY_TRACT | Status: DC | PRN

## 2021-09-07 NOTE — Progress Notes (Addendum)
2pm: CSW spoke with Jess Barters at [facility] who states that clinicals and care plan were submitted to [name] for approval and the turn around for authorization can take up to 15 business days. [facility] is fully staffed and ready to provide services to the patient at home once authorization is obtained.  12pm: CSW spoke with Lorriane Shire at [facility] who states he will speak with Jess Barters and return call to CSW with updates.  Edwin Dada, MSW, LCSW Transitions of Care  Clinical Social Worker II [phone]

## 2021-09-07 NOTE — Progress Notes (Incomplete)
Nutrition Follow-up  DOCUMENTATION CODES:  Not applicable  INTERVENTION:  Transition to continuous TF via PEG:  Osmolite 1.5 @ 60 ml/hr (1440 ml/day) 45 ml ProSource TF BID Provides: 2240 kcal, 112 grams protein, 1094 ml free water.  200 ml free water every 6 hours Total free water: 1894  NUTRITION DIAGNOSIS:  Inadequate oral intake related to inability to eat as evidenced by NPO status. - Ongoing  GOAL:  Patient will meet greater than or equal to 90% of their needs - Met with TF  MONITOR:  Labs, Weight trends, TF tolerance, Skin, I & O's  REASON FOR ASSESSMENT:  Ventilator, Consult Enteral/tube feeding initiation and management  ASSESSMENT:  51 year old male who originally presented to [facility] on 1/19 after out-of-hospital cardiac arrest due to suspected angioedema from ACE inhibitor leading to asphyxiation. PMH of HTN, HLD.  2/11 - tracheostomy placed 2/22 - PEG placed    Nutritionally Relevant Medications: Scheduled Meds:  doxazosin  2 mg Per Tube Daily   PROSource TF  45 mL Per Tube BID   free water  200 mL Per Tube Q6H   pantoprazole sodium  40 mg Per Tube QHS   polyethylene glycol  17 g Per Tube Daily   spironolactone  25 mg Per Tube Daily   Continuous Infusions:  OSMOLITE 1.5 CAL 1,000 mL (09/07/21 0019)   PRN Meds: loperamide HCl, ondansetron  Labs Reviewed  Diet Order:   Diet Order     None       EDUCATION NEEDS:  Not appropriate for education at this time  Skin:  Skin Assessment: Skin Integrity Issues: Skin Integrity Issues:: Stage I DTI: N/A Stage I: rt heel, rt upper posterior foot, Stage II: N/A Stage III: N/A Unstageable: N/A Other: MASD to bilateral buttocks  Last BM:  7/5 - type 6  Height:  Ht Readings from Last 1 Encounters:  04/18/21 $RemoveB'5\' 10"'cPoHOSTd$  (1.778 m)   Weight:  Wt Readings from Last 1 Encounters:  09/07/21 98 kg   Ideal Body Weight:  75.5 kg  BMI:  Body mass index is 31 kg/m.  Estimated Nutritional Needs:  Kcal:   2200-2400 Protein:  110-125 grams Fluid:  >/= 2.2 L   Ranell Patrick, RD, LDN Clinical Dietitian RD pager # available in [place]  After hours/weekend pager # available in [facility]

## 2021-09-07 NOTE — Progress Notes (Signed)
Progress Note    Billy Cherry   UEA:[id number]  DOB: 1970-10-02  DOA: 03/30/2021     161 PCP: Patient, No Pcp Per  Initial CC: found unresponsive   Hospital Course: Patient is a 51 years old male with past medical history of hypertension who was brought into the hospital after being found unresponsive by his wife.  Patient was initially admitted to [facility] hospital for out of hospital cardiac arrest and ventricular fibrillation with respiratory arrest suspected from angioedema from ACE inhibitor leading to asphyxiation.  Patient was then subsequently transferred to [facility].  Below is the Sequence of events during hospitalization,  1/19 left heart cath without significant CAD.  Concern for anoxic brain injury 1/21: MRI shows areas of hypoxic-ischemic injury bilaterally. Fevered; cultures obtained. 1/23 severe brain damage 1/25: PEG . Neuro exam unchanged.  Does exhibit new eyelid tremor like movements and lip/jaw tremor like vs clonus movement.  Bolused Keppra and increased maintenance dose.  Repeat EEG. CTH: concerning for progression of anoxic injury.  1/26: Repeat EEG unchanged and continues to show myoclonic seizures despite addition of Valproic acid yesterday.  03/30/2021 Transfer to [facility] from [place] for continuous EEG 1/28: Continues to have myoclonic sz on EEG 2/3 no meaningful neuro recovery. Diuresed 2/6 New fever to 102, WBC 16K - treated with vancomycin for 7d based on cultures.  2/11: percutaneous tracheostomy 2/13 >2/20 on trach collar. No vent.  2/20 Trach change to 6 cuffless 2/28: fever 3/13: eliquis started 4/3: fever 06/06/2021 CT of the chest with new PE and haziness consistent with pneumonia 4/5-4/9: responding well to PRN lasix 4/11 fever again after completion of abx- work up in progress 4/13 continues to have low-grade temps cultures from 4/11 no growth to date-suspect silent/occult aspiration-heparin changed back to Eliquis 4/13 4/14: Work-up  negative with no growth to date but red menance Tmax elevated 99.4 and respiratory rate up-x-ray did not show         any pneumonia but probably silently aspirating 08/04/21- ongoing.  Patient remains unresponsive.  Awaiting for placement and disposition has been difficult 6/20 -> difficult placement team working on options, [facility]? 6/21 > 6/27: positive blood cultures (staph capitis/epidermidis), likely contamination. No antibiotics initiated.  Interval History:  No events overnight. Laying in bed with nonpurposeful eye movements. Does not react to stimuli.  Nothing new today.   Assessment and Plan:  S/p Cardiac arrest Postarrest anoxic encephalopathy Postarrest respiratory failure s/p tracheostomy S/P PEG tube placement 2D echocardiogram showed reduced LV function at <20% with global hypokinesis.  Now status post trach and PEG.  Remains with anoxic encephalopathy. -Continue Keppra - poor prognosis overall; professional recommendation is transition to hospice/comfort care   Anoxic encephalopathy ([name]) MRI brain significant for hypoxic brain injury.  Nonpurposeful eye movements and unable to interact in any meaningful capacity.  Poor neurologic status has remained unchanged - Patient not likely to have any significant meaningful recovery given prolonged length of hospitalization - dispo plan being pursued for patient to go home with [facility] and care of his wife   Acute respiratory failure with hypoxia Status post trach collar. Now chronic. -Continue treatment for secretions with atropine, scopolamine, and Robinul - at risk for recurrent infections   RLL pneumonia Previously had completed vancomycin for MSSA and [name] in early march.  Received more than 7-day course of IV Fortaz. On 4/3, CT chest was ordered that indicated an area of alveolar infiltration that may represent pneumonia.  Tracheal aspirate  has been sent for culture and is significant for normal  respiratory flora. Concerning for aspiration. Completed course of Zosyn. Patient with worsening secretions. Chest x-ray on 6/11 concerning for possible pneumonia. Patient retreated for pneumonia with Cefepime x7 days. Patient has developed worsening secretions. CT chest negative for recurrent pneumonia. Aggressive pulmonary toileting. - at risk for recurrent infections and imminent decline    Positive blood cultures Tmax of 100 F on 6/19. No clear source, although patient has had multiple episodes of aspiration pneumonia. Blood cultures (6/20) obtained and are positive for staphylococcus capitis. Repeat blood cultures (6/20) preliminarily positive for GPC in clusters. Discussed with ID. Repeat cultures with different species of staphylococcus. Likely contaminant. Antibiotics held.   DVT (deep venous thrombosis) ([name]) Duplex ultrasound of lower extremity on 05/15/2021 showed age indeterminate deep vein thrombosis involving the left femoral vein, left proximal profunda vein, left posterior tibial veins, left popliteal vein, and left peroneal veins. -Continue Eliquis   Dysphagia On bolus feeds per dietitian -Aspiration precautions -Continue tube feeds   Emesis Unsure of etiology. Tube feeds held and eventually resumed. Abdominal x-ray without clear etiology. Obtained CT abdomen/pelvis which did not identify an etiology for symptoms. -Zofran PRN   Pulmonary embolus ([name]) Noted to have possible acute on chronic pulmonary embolism on CT.  Also have some evidence of possible pulmonary hypertension.   -Continue Eliquis   Pressure injury of skin Lateral penis, posterior right heel, right foot. Not present on admission.   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 Per neuro notes, suppressing his myoclonus will not ultimately change the prognosis. -Continue Keppra, Klonopin   Peripheral edema -not very notable for me today, follow  -Strict in/out    Old records reviewed in assessment of this patient  Antimicrobials:   DVT prophylaxis:   apixaban (ELIQUIS) tablet 5 mg   Code Status:   Code Status: Full Code  Mobility Assessment (last 72 hours)     Mobility Assessment     Row Name 09/07/21 0847 09/06/21 2148 09/06/21 0841 09/05/21 2017 09/05/21 0900   Does patient have an order for bedrest or is patient medically unstable Yes- Bedfast (Level 1) - Complete Yes- Bedfast (Level 1) - Complete Yes- Bedfast (Level 1) - Complete Yes- Bedfast (Level 1) - Complete Yes- Bedfast (Level 1) - Complete   What is the highest level of mobility based on the progressive mobility assessment? Level 1 (Bedfast) - Unable to balance while sitting on edge of bed Level 1 (Bedfast) - Unable to balance while sitting on edge of bed Level 1 (Bedfast) - Unable to balance while sitting on edge of bed Level 1 (Bedfast) - Unable to balance while sitting on edge of bed Level 1 (Bedfast) - Unable to balance while sitting on edge of bed   Is the above level different from baseline mobility prior to current illness? No - Consider discontinuing PT/OT -- No - Consider discontinuing PT/OT -- No - Consider discontinuing PT/OT            Disposition Plan:  Home with HH; [facility] Status is: Inpt  Objective: Blood pressure 116/84, pulse 88, temperature 98.4 F (36.9 C), temperature source Axillary, resp. rate 18, height 5\' 10"  (1.778 m), weight 98 kg, SpO2 96 %.  Examination:  Physical Exam Constitutional:  Comments: Chronically ill appearing gentleman laying in bed with no purposeful movements and unable to interact   HENT:     Head: Normocephalic.     Mouth/Throat:     Mouth: Mucous membranes are moist.  Eyes:     Pupils:  Pupils are equal, round, and reactive to light.  Neck:     Comments: Trach in place Cardiovascular:     Rate and Rhythm: Normal rate and regular rhythm.  Pulmonary:     Comments: Trach in place with coarse sounds Abdominal:     General: Bowel sounds are normal.     Palpations: Abdomen is soft.     Comments: PEG in place  Musculoskeletal:        General: No swelling.     Cervical back: No rigidity.  Skin:    General: Skin is warm.  Neurological:     Comments: Possible mild w/d to pain in LLE vs reflex. Does not w/d to pain otherwise, does not interact or follow commands. Does not track with eyes       Consultants:    Procedures:    Data Reviewed: No results found for this or any previous visit (from the past 24 hour(s)).  I have Reviewed nursing notes, Vitals, and Lab results since pt's last encounter. Pertinent lab results : see above I have reviewed the last note from staff over past 24 hours I have discussed pt's care plan and test results with nursing staff, case manager   LOS: 161 days   Dwyane Dee, MD [facility] 09/07/2021, 5:42 PM

## 2021-09-08 ENCOUNTER — Other Ambulatory Visit (HOSPITAL_COMMUNITY): Payer: Self-pay

## 2021-09-08 NOTE — Plan of Care (Signed)

## 2021-09-08 NOTE — Progress Notes (Signed)
Brief Nutrition Note  Pt stable on TF regimen at this time. Weight and labs also stable. Nutrition team will sign off at this time as no changes in plan are anticipated. If additional nutrition needs arise or if new interventions are requested, plus submit a new RD consult.  Greig Castilla, RD, LDN Clinical Dietitian RD pager # available in [facility]  After hours/weekend pager # available in [facility]

## 2021-09-08 NOTE — Progress Notes (Signed)
Progress Note    Billy Cherry   ZOX:[id number]  DOB: May 20, 1970  DOA: 03/30/2021     162 PCP: Patient, No Pcp Per  Initial CC: found unresponsive   Hospital Course: Patient is a 51 years old male with past medical history of hypertension who was brought into the hospital after being found unresponsive by his wife.  Patient was initially admitted to [facility] hospital for out of hospital cardiac arrest and ventricular fibrillation with respiratory arrest suspected from angioedema from ACE inhibitor leading to asphyxiation.  Patient was then subsequently transferred to [facility].  Below is the Sequence of events during hospitalization,  1/19 left heart cath without significant CAD.  Concern for anoxic brain injury 1/21: MRI shows areas of hypoxic-ischemic injury bilaterally. Fevered; cultures obtained. 1/23 severe brain damage 1/25: PEG . Neuro exam unchanged.  Does exhibit new eyelid tremor like movements and lip/jaw tremor like vs clonus movement.  Bolused Keppra and increased maintenance dose.  Repeat EEG. CTH: concerning for progression of anoxic injury.  1/26: Repeat EEG unchanged and continues to show myoclonic seizures despite addition of Valproic acid yesterday.  03/30/2021 Transfer to [facility] from [place] for continuous EEG 1/28: Continues to have myoclonic sz on EEG 2/3 no meaningful neuro recovery. Diuresed 2/6 New fever to 102, WBC 16K - treated with vancomycin for 7d based on cultures.  2/11: percutaneous tracheostomy 2/13 >2/20 on trach collar. No vent.  2/20 Trach change to 6 cuffless 2/28: fever 3/13: eliquis started 4/3: fever 06/06/2021 CT of the chest with new PE and haziness consistent with pneumonia 4/5-4/9: responding well to PRN lasix 4/11 fever again after completion of abx- work up in progress 4/13 continues to have low-grade temps cultures from 4/11 no growth to date-suspect silent/occult aspiration-heparin changed back to Eliquis 4/13 4/14: Work-up  negative with no growth to date but red menance Tmax elevated 99.4 and respiratory rate up-x-ray did not show         any pneumonia but probably silently aspirating 08/04/21- ongoing.  Patient remains unresponsive.  Awaiting for placement and disposition has been difficult 6/20 -> difficult placement team working on [facility], [facility]? 6/21 > 6/27: positive blood cultures (staph capitis/epidermidis), likely contamination. No antibiotics initiated.  Interval History:  No events overnight. Laying in bed with nonpurposeful eye movements. Does not react to stimuli.  Nothing new today.   Assessment and Plan:  S/p Cardiac arrest Postarrest anoxic encephalopathy Postarrest respiratory failure s/p tracheostomy S/P PEG tube placement 2D echocardiogram showed reduced LV function at <20% with global hypokinesis.  Now status post trach and PEG.  Remains with anoxic encephalopathy. -Continue Keppra - poor prognosis overall; professional recommendation is transition to hospice/comfort care   Anoxic encephalopathy ([name]) MRI brain significant for hypoxic brain injury.  Nonpurposeful eye movements and unable to interact in any meaningful capacity.  Poor neurologic status has remained unchanged - Patient not likely to have any significant meaningful recovery given prolonged length of hospitalization - TENTATIVE dispo plan being pursued for patient to go home with [facility] and care of his wife   Acute respiratory failure with hypoxia Status post trach collar. Now chronic. -Continue treatment for secretions with atropine, scopolamine, and Robinul - at risk for recurrent infections   RLL pneumonia Previously had completed vancomycin for MSSA and [name] in early march.  Received more than 7-day course of IV Fortaz. On 4/3, CT chest was ordered that indicated an area of alveolar infiltration that may represent pneumonia.  Tracheal  aspirate has been sent for culture and is significant for  normal respiratory flora. Concerning for aspiration. Completed course of Zosyn. Patient with worsening secretions. Chest x-ray on 6/11 concerning for possible pneumonia. Patient retreated for pneumonia with Cefepime x7 days. Patient has developed worsening secretions. CT chest negative for recurrent pneumonia. Aggressive pulmonary toileting. - at risk for recurrent infections and imminent decline    Positive blood cultures Tmax of 100 F on 6/19. No clear source, although patient has had multiple episodes of aspiration pneumonia. Blood cultures (6/20) obtained and are positive for staphylococcus capitis. Repeat blood cultures (6/20) preliminarily positive for GPC in clusters. Discussed with ID. Repeat cultures with different species of staphylococcus. Likely contaminant. Antibiotics held.   DVT (deep venous thrombosis) ([name]) Duplex ultrasound of lower extremity on 05/15/2021 showed age indeterminate deep vein thrombosis involving the left femoral vein, left proximal profunda vein, left posterior tibial veins, left popliteal vein, and left peroneal veins. -Continue Eliquis   Dysphagia On bolus feeds per dietitian -Aspiration precautions -Continue tube feeds   Emesis Unsure of etiology. Tube feeds held and eventually resumed. Abdominal x-ray without clear etiology. Obtained CT abdomen/pelvis which did not identify an etiology for symptoms. -Zofran PRN   Pulmonary embolus ([name]) Noted to have possible acute on chronic pulmonary embolism on CT.  Also have some evidence of possible pulmonary hypertension.   -Continue Eliquis   Pressure injury of skin Lateral penis, posterior right heel, right foot. Not present on admission.   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 Per neuro notes, suppressing his myoclonus will not ultimately change the prognosis. -Continue Keppra, Klonopin   Peripheral edema -will monitor     Old records reviewed in assessment of this patient  Antimicrobials:   DVT prophylaxis:   apixaban (ELIQUIS) tablet 5 mg   Code Status:   Code Status: Full Code  Mobility Assessment (last 72 hours)     Mobility Assessment     Row Name 09/07/21 0847 09/06/21 2148 09/06/21 0841 09/05/21 2017     Does patient have an order for bedrest or is patient medically unstable Yes- Bedfast (Level 1) - Complete Yes- Bedfast (Level 1) - Complete Yes- Bedfast (Level 1) - Complete Yes- Bedfast (Level 1) - Complete    What is the highest level of mobility based on the progressive mobility assessment? Level 1 (Bedfast) - Unable to balance while sitting on edge of bed Level 1 (Bedfast) - Unable to balance while sitting on edge of bed Level 1 (Bedfast) - Unable to balance while sitting on edge of bed Level 1 (Bedfast) - Unable to balance while sitting on edge of bed    Is the above level different from baseline mobility prior to current illness? No - Consider discontinuing PT/OT -- No - Consider discontinuing PT/OT --             Disposition Plan:  Home with HH; [facility] Status is: Inpt  Objective: Blood pressure (!) 131/98, pulse 87, temperature 98.2 F (36.8 C), temperature source Axillary, resp. rate 18, height 5\' 10"  (1.778 m), weight 98 kg, SpO2 95 %.  Examination:  Physical Exam Constitutional:      Comments: Chronically ill appearing gentleman laying in bed with no purposeful movements and unable to interact   HENT:  Head: Normocephalic.     Mouth/Throat:     Mouth: Mucous membranes are moist.  Eyes:     Pupils: Pupils are equal, round, and reactive to light.  Neck:     Comments: Trach in place Cardiovascular:     Rate and Rhythm: Normal rate and regular rhythm.  Pulmonary:     Comments:  Trach in place with coarse sounds Abdominal:     General: Bowel sounds are normal.     Palpations: Abdomen is soft.     Comments: PEG in place  Musculoskeletal:        General: No swelling.     Cervical back: No rigidity.  Skin:    General: Skin is warm.  Neurological:     Comments: Possible mild w/d to pain in LLE vs reflex. Does not w/d to pain otherwise, does not interact or follow commands. Does not track with eyes       Consultants:    Procedures:    Data Reviewed: No results found for this or any previous visit (from the past 24 hour(s)).  I have Reviewed nursing notes, Vitals, and Lab results since pt's last encounter. Pertinent lab results : see above I have reviewed the last note from staff over past 24 hours I have discussed pt's care plan and test results with nursing staff, case manager   LOS: 162 days   Dwyane Dee, MD [facility] 09/08/2021, 4:10 PM

## 2021-09-09 NOTE — Progress Notes (Signed)
Progress Note    Billy Cherry   [id number]  DOB: [birth year]  DOA: 03/30/2021     163 PCP: Billy Cherry, No Pcp Per  Initial CC: found unresponsive   Hospital Course: Billy Cherry is a 51 years old male with past medical history of hypertension who was brought into the hospital after being found unresponsive by his wife.  Billy Cherry was initially admitted to [facility] for out of hospital cardiac arrest and ventricular fibrillation with respiratory arrest suspected from angioedema from ACE inhibitor leading to asphyxiation.  Billy Cherry was then subsequently transferred to [facility].  Below is the Sequence of events during hospitalization,  1/19 left heart cath without significant CAD.  Concern for anoxic brain injury 1/21: MRI shows areas of hypoxic-ischemic injury bilaterally. Fevered; cultures obtained. 1/23 severe brain damage 1/25: PEG . Neuro exam unchanged.  Does exhibit new eyelid tremor like movements and lip/jaw tremor like vs clonus movement.  Bolused Keppra and increased maintenance dose.  Repeat EEG. CTH: concerning for progression of anoxic injury.  1/26: Repeat EEG unchanged and continues to show myoclonic seizures despite addition of Valproic acid yesterday.  03/30/2021 Transfer to [name] from [place] for continuous EEG 1/28: Continues to have myoclonic sz on EEG 2/3 no meaningful neuro recovery. Diuresed 2/6 New fever to 102, WBC 16K - treated with vancomycin for 7d based on cultures.  2/11: percutaneous tracheostomy 2/13 >2/20 on trach collar. No vent.  2/20 Trach change to 6 cuffless 2/28: fever 3/13: eliquis started 4/3: fever 06/06/2021 CT of the chest with new PE and haziness consistent with pneumonia 4/5-4/9: responding well to PRN lasix 4/11 fever again after completion of abx- work up in progress 4/13 continues to have low-grade temps cultures from 4/11 no growth to date-suspect silent/occult aspiration-heparin changed back to Eliquis 4/13 4/14: Work-up  negative with no growth to date but red menance Tmax elevated 99.4 and respiratory rate up-x-ray did not show         any pneumonia but probably silently aspirating 08/04/21- ongoing.  Billy Cherry remains unresponsive.  Awaiting for placement and disposition has been difficult 6/20 -> difficult placement team working on options, [facility]? 6/21 > 6/27: positive blood cultures (staph capitis/epidermidis), likely contamination. No antibiotics initiated.  Interval History:  No events overnight. Laying in bed with nonpurposeful eye movements. Does not react to stimuli.  Nothing new today.   Assessment and Plan:  S/p Cardiac arrest Postarrest anoxic encephalopathy Postarrest respiratory failure s/p tracheostomy S/P PEG tube placement 2D echocardiogram showed reduced LV function at <20% with global hypokinesis.  Now status post trach and PEG.  Remains with anoxic encephalopathy. -Continue Keppra - poor prognosis overall; professional recommendation is transition to hospice/comfort care   Anoxic encephalopathy ([facility]) MRI brain significant for hypoxic brain injury.  Nonpurposeful eye movements and unable to interact in any meaningful capacity.  Poor neurologic status has remained unchanged - Billy Cherry not likely to have any significant meaningful recovery given prolonged length of hospitalization - TENTATIVE dispo plan being pursued for Billy Cherry to go home with [name] and care of his wife   Acute respiratory failure with hypoxia Status post trach collar. Now chronic. -Continue treatment for secretions with atropine, scopolamine, and Robinul - at risk for recurrent infections   RLL pneumonia Previously had completed vancomycin for MSSA and [name] in early march.  Received more than 7-day course of IV Fortaz. On 4/3, CT chest was ordered that indicated an area of alveolar infiltration that may represent pneumonia.  Tracheal  aspirate has been sent for culture and is significant for  normal respiratory flora. Concerning for aspiration. Completed course of Zosyn. Billy Cherry with worsening secretions. Chest x-ray on 6/11 concerning for possible pneumonia. Billy Cherry retreated for pneumonia with Cefepime x7 days. Billy Cherry has developed worsening secretions. CT chest negative for recurrent pneumonia. Aggressive pulmonary toileting. - at risk for recurrent infections and imminent decline    Positive blood cultures Tmax of 100 F on 6/19. No clear source, although Billy Cherry has had multiple episodes of aspiration pneumonia. Blood cultures (6/20) obtained and are positive for staphylococcus capitis. Repeat blood cultures (6/20) preliminarily positive for GPC in clusters. Discussed with ID. Repeat cultures with different species of staphylococcus. Likely contaminant. Antibiotics held.   DVT (deep venous thrombosis) ([name]) Duplex ultrasound of lower extremity on 05/15/2021 showed age indeterminate deep vein thrombosis involving the left femoral vein, left proximal profunda vein, left posterior tibial veins, left popliteal vein, and left peroneal veins. -Continue Eliquis   Dysphagia On bolus feeds per dietitian -Aspiration precautions -Continue tube feeds   Emesis Unsure of etiology. Tube feeds held and eventually resumed. Abdominal x-ray without clear etiology. Obtained CT abdomen/pelvis which did not identify an etiology for symptoms. -Zofran PRN   Pulmonary embolus ([name]) Noted to have possible acute on chronic pulmonary embolism on CT.  Also have some evidence of possible pulmonary hypertension.   -Continue Eliquis   Pressure injury of skin Lateral penis, posterior right heel, right foot. Not present on admission.   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 Per neuro notes, suppressing his myoclonus will not ultimately change the prognosis. -Continue Keppra, Klonopin   Peripheral edema -will monitor     Old records reviewed in assessment of this Billy Cherry  Antimicrobials:   DVT prophylaxis:   apixaban (ELIQUIS) tablet 5 mg   Code Status:   Code Status: Full Code  Mobility Assessment (last 72 hours)     Mobility Assessment     Row Name 09/08/21 2152 09/08/21 0840 09/07/21 0847 09/06/21 2148     Does Billy Cherry have an order for bedrest or is Billy Cherry medically unstable Yes- Bedfast (Level 1) - Complete Yes- Bedfast (Level 1) - Complete Yes- Bedfast (Level 1) - Complete Yes- Bedfast (Level 1) - Complete    What is the highest level of mobility based on the progressive mobility assessment? Level 1 (Bedfast) - Unable to balance while sitting on edge of bed Level 1 (Bedfast) - Unable to balance while sitting on edge of bed Level 1 (Bedfast) - Unable to balance while sitting on edge of bed Level 1 (Bedfast) - Unable to balance while sitting on edge of bed    Is the above level different from baseline mobility prior to current illness? No - Consider discontinuing PT/OT No - Consider discontinuing PT/OT No - Consider discontinuing PT/OT --             Disposition Plan:  Home with HH; [facility] Status is: Inpt  Objective: Blood pressure 133/87, pulse 84, temperature 98.4 F (36.9 C), temperature source Oral, resp. rate 18, height 5\' 10"  (1.778 m), weight 96 kg, SpO2 95 %.  Examination:  Physical Exam Constitutional:      Comments: Chronically ill appearing gentleman laying in bed with no purposeful movements and unable to interact   HENT:  Head: Normocephalic.     Mouth/Throat:     Mouth: Mucous membranes are moist.  Eyes:     Pupils: Pupils are equal, round, and reactive to light.  Neck:     Comments: Trach in place Cardiovascular:     Rate and Rhythm: Normal rate and regular rhythm.   Pulmonary:     Comments: Trach in place with coarse sounds Abdominal:     General: Bowel sounds are normal.     Palpations: Abdomen is soft.     Comments: PEG in place  Musculoskeletal:        General: No swelling.     Cervical back: No rigidity.  Skin:    General: Skin is warm.  Neurological:     Comments: Possible mild w/d to pain in LLE vs reflex. Does not w/d to pain otherwise, does not interact or follow commands. Does not track with eyes       Consultants:    Procedures:    Data Reviewed: No results found for this or any previous visit (from the past 24 hour(s)).  I have Reviewed nursing notes, Vitals, and Lab results since pt's last encounter. Pertinent lab results : see above I have reviewed the last note from staff over past 24 hours I have discussed pt's care plan and test results with nursing staff, case manager   LOS: 163 days   Lewie Chamber, MD [facility] 09/09/2021, 6:07 PM

## 2021-09-10 NOTE — Progress Notes (Signed)
Progress Note    Billy Cherry   IOM:[id number]  DOB: [birth year]  DOA: 03/30/2021     164 PCP: Patient, No Pcp Per  Initial CC: found unresponsive   Hospital Course: Patient is a 51 years old male with past medical history of hypertension who was brought into the hospital after being found unresponsive by his wife.  Patient was initially admitted to [facility] hospital for out of hospital cardiac arrest and ventricular fibrillation with respiratory arrest suspected from angioedema from ACE inhibitor leading to asphyxiation.  Patient was then subsequently transferred to [facility].  Below is the Sequence of events during hospitalization,  1/19 left heart cath without significant CAD.  Concern for anoxic brain injury 1/21: MRI shows areas of hypoxic-ischemic injury bilaterally. Fevered; cultures obtained. 1/23 severe brain damage 1/25: PEG . Neuro exam unchanged.  Does exhibit new eyelid tremor like movements and lip/jaw tremor like vs clonus movement.  Bolused Keppra and increased maintenance dose.  Repeat EEG. CTH: concerning for progression of anoxic injury.  1/26: Repeat EEG unchanged and continues to show myoclonic seizures despite addition of Valproic acid yesterday.  03/30/2021 Transfer to [facility] from [place] for continuous EEG 1/28: Continues to have myoclonic sz on EEG 2/3 no meaningful neuro recovery. Diuresed 2/6 New fever to 102, WBC 16K - treated with vancomycin for 7d based on cultures.  2/11: percutaneous tracheostomy 2/13 >2/20 on trach collar. No vent.  2/20 Trach change to 6 cuffless 2/28: fever 3/13: eliquis started 4/3: fever 06/06/2021 CT of the chest with new PE and haziness consistent with pneumonia 4/5-4/9: responding well to PRN lasix 4/11 fever again after completion of abx- work up in progress 4/13 continues to have low-grade temps cultures from 4/11 no growth to date-suspect silent/occult aspiration-heparin changed back to Eliquis 4/13 4/14: Work-up  negative with no growth to date but red menance Tmax elevated 99.4 and respiratory rate up-x-ray did not show         any pneumonia but probably silently aspirating 08/04/21- ongoing.  Patient remains unresponsive.  Awaiting for placement and disposition has been difficult 6/20 -> difficult placement team working on options, [facility]? 6/21 > 6/27: positive blood cultures (staph capitis/epidermidis), likely contamination. No antibiotics initiated.  Interval History:  No events overnight. Laying in bed with nonpurposeful eye movements. Does not react to stimuli.  Nothing new today.   Assessment and Plan:  S/p Cardiac arrest Postarrest anoxic encephalopathy Postarrest respiratory failure s/p tracheostomy S/P PEG tube placement 2D echocardiogram showed reduced LV function at <20% with global hypokinesis.  Now status post trach and PEG.  Remains with anoxic encephalopathy. -Continue Keppra - poor prognosis overall; professional recommendation is transition to hospice/comfort care   Anoxic encephalopathy ([name]) MRI brain significant for hypoxic brain injury.  Nonpurposeful eye movements and unable to interact in any meaningful capacity.  Poor neurologic status has remained unchanged - Patient not likely to have any significant meaningful recovery given prolonged length of hospitalization - TENTATIVE dispo plan being pursued for patient to go home with [facility] and care of his wife   Acute respiratory failure with hypoxia Status post trach collar. Now chronic. -Continue treatment for secretions with atropine, scopolamine, and Robinul - at risk for recurrent infections   RLL pneumonia Previously had completed vancomycin for MSSA and [name] in early march.  Received more than 7-day course of IV Fortaz. On 4/3, CT chest was ordered that indicated an area of alveolar infiltration that may represent pneumonia.  Tracheal  aspirate has been sent for culture and is significant for  normal respiratory flora. Concerning for aspiration. Completed course of Zosyn. Patient with worsening secretions. Chest x-ray on 6/11 concerning for possible pneumonia. Patient retreated for pneumonia with Cefepime x7 days. Patient has developed worsening secretions. CT chest negative for recurrent pneumonia. Aggressive pulmonary toileting. - at risk for recurrent infections and imminent decline    Positive blood cultures Tmax of 100 F on 6/19. No clear source, although patient has had multiple episodes of aspiration pneumonia. Blood cultures (6/20) obtained and are positive for staphylococcus capitis. Repeat blood cultures (6/20) preliminarily positive for GPC in clusters. Discussed with ID. Repeat cultures with different species of staphylococcus. Likely contaminant. Antibiotics held.   DVT (deep venous thrombosis) ([name]) Duplex ultrasound of lower extremity on 05/15/2021 showed age indeterminate deep vein thrombosis involving the left femoral vein, left proximal profunda vein, left posterior tibial veins, left popliteal vein, and left peroneal veins. -Continue Eliquis   Dysphagia On bolus feeds per dietitian -Aspiration precautions -Continue tube feeds   Emesis Unsure of etiology. Tube feeds held and eventually resumed. Abdominal x-ray without clear etiology. Obtained CT abdomen/pelvis which did not identify an etiology for symptoms. -Zofran PRN   Pulmonary embolus ([name]) Noted to have possible acute on chronic pulmonary embolism on CT.  Also have some evidence of possible pulmonary hypertension.   -Continue Eliquis   Pressure injury of skin Lateral penis, posterior right heel, right foot. Not present on admission.   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 Per neuro notes, suppressing his myoclonus will not ultimately change the prognosis. -Continue Keppra, Klonopin   Peripheral edema -will monitor     Old records reviewed in assessment of this patient  Antimicrobials:   DVT prophylaxis:   apixaban (ELIQUIS) tablet 5 mg   Code Status:   Code Status: Full Code  Mobility Assessment (last 72 hours)     Mobility Assessment     Row Name 09/09/21 2045 09/08/21 2152 09/08/21 0840       Does patient have an order for bedrest or is patient medically unstable Yes- Bedfast (Level 1) - Complete Yes- Bedfast (Level 1) - Complete Yes- Bedfast (Level 1) - Complete     What is the highest level of mobility based on the progressive mobility assessment? Level 1 (Bedfast) - Unable to balance while sitting on edge of bed Level 1 (Bedfast) - Unable to balance while sitting on edge of bed Level 1 (Bedfast) - Unable to balance while sitting on edge of bed     Is the above level different from baseline mobility prior to current illness? No - Consider discontinuing PT/OT No - Consider discontinuing PT/OT No - Consider discontinuing PT/OT              Disposition Plan:  Home with HH; [facility] Status is: Inpt  Objective: Blood pressure (!) 137/92, pulse 82, temperature 98.2 F (36.8 C), temperature source Oral, resp. rate 18, height 5\' 10"  (1.778 m), weight 95 kg, SpO2 93 %.  Examination:  Physical Exam Constitutional:      Comments: Chronically ill appearing gentleman laying in bed with no purposeful movements and unable to interact   HENT:     Head: Normocephalic.     Mouth/Throat:     Mouth:  Mucous membranes are moist.  Eyes:     Pupils: Pupils are equal, round, and reactive to light.  Neck:     Comments: Trach in place Cardiovascular:     Rate and Rhythm: Normal rate and regular rhythm.  Pulmonary:     Comments: Trach in place with coarse sounds Abdominal:     General: Bowel sounds are normal.      Palpations: Abdomen is soft.     Comments: PEG in place  Musculoskeletal:        General: No swelling.     Cervical back: No rigidity.  Skin:    General: Skin is warm.  Neurological:     Comments: Possible mild w/d to pain in LLE vs reflex. Does not w/d to pain otherwise, does not interact or follow commands. Does not track with eyes       Consultants:    Procedures:    Data Reviewed: No results found for this or any previous visit (from the past 24 hour(s)).  I have Reviewed nursing notes, Vitals, and Lab results since pt's last encounter. Pertinent lab results : see above I have reviewed the last note from staff over past 24 hours I have discussed pt's care plan and test results with nursing staff, case manager   LOS: 164 days   Dwyane Dee, MD [facility] 09/10/2021, 3:24 PM

## 2021-09-11 NOTE — Progress Notes (Signed)
NAME:  Billy Cherry, MRN:  [id number], DOB:  1970/09/09, LOS: 165 ADMISSION DATE:  03/30/2021, CONSULTATION DATE:  1/19 REFERRING MD:  Evette Georges, CHIEF COMPLAINT:  Found down, cardiac arrest   Brief Pt Description / Synopsis:  51 year old male with out-of-hospital V. fib cardiac arrest in setting of respiratory arrest due to suspected angioedema from ACE inhibitor leading to asphyxiation.  Concern on his 12 lead EKG for anterolateral ST elevation so he was taken to the cath lab where his study showed no significant coronary artery disease and an LVEF < 20%  Now with anoxic brain injury.  Pertinent  Medical History  Hypertension  Significant Hospital Events: Including procedures, antibiotic start and stop dates in addition to other pertinent events   1/19 admission, left heart cath without significant CAD.  Concern for anoxic brain injury 1/20: MRI pending, Neuro following 1/21: MRI shows areas of hypoxic-ischemic injury bilaterally. Fevered; cultures obtained. 1/23 severe brain damage 1/24 evidence of severe brain damage, remains critically ill 1/25: Plan for PEG today. Neuro exam unchanged.  Does exhibit new eyelid tremor like movements and lip/jaw tremor like vs clonus movement.  Bolused Keppra and increased maintenance dose.  Repeat EEG. CTH: concerning for progression of anoxic injury.  1/26: Repeat EEG unchanged and continues to show myoclonic seizures despite addition of Valproic acid yesterday. transfer to [facility] for continuous EEG 1/28: Continues to have myoclonic sz on EEG 1/29 No acute issues overnight 2/3 no meaningful neuro recovery. Diuresed 2/5 unchanged 2/6 New fever to 102, WBC 16K - treated with vancomycin for 7d based on cultures.  2/11: percutaneous tracheostomy 2/13 >2/20 on trach collar. No vent.  2/20 Trach change to 6 cuffless 2/27 status unchanged 3/13 unchanged 3/20 due for trach exchange given continue secretions (although improved) will stick to 6 cuffless  for exchange  06/05/2021 Continued thick  secretions, scant blood tinged 4/3/am 06/06/2021 CT of the chest with new PE and haziness consistent with pneumonia 5/30 continue tube feeding/free water  Interim History / Subjective:  Remains awake but not responsive to environement, small thick secretions.  Objective   Blood pressure 121/89, pulse 79, temperature 98.6 F (37 C), temperature source Oral, resp. rate 16, height 5\' 10"  (1.778 m), weight 97 kg, SpO2 100 %.    FiO2 (%):  [21 %] 21 %   Intake/Output Summary (Last 24 hours) at 09/11/2021 0814 Last data filed at 09/11/2021 0600 Gross per 24 hour  Intake 3920 ml  Output 1200 ml  Net 2720 ml    Filed Weights   09/09/21 0500 09/10/21 0500 09/11/21 0449  Weight: 96 kg 95 kg 97 kg   Examination:  No distress Thick secretions, trach in place Lungs are clear, no wheezing Eyes are open not tracking, not following commands, no motor response, intermittent yawning Heart sounds regular, ext warm  CXR 09/04/21 Cardiomegaly. Tracheostomy in place. Mild patchy density at both lung bases could be atelectasis or mild patchy basilar pneumonia. Similar appearance to the study 2 weeks ago.  Assessment & Plan:  Out of hospital V-fib arrest Postarrest anoxic brain injury Myoclonus Respiratory failure with prolonged mechanical ventilation Tracheostomy dependent Cardiomyopathy Pulmonary embolism Hypertension  -Trach care per protocol and suctioning protocol; unchanged recs this week - Not a candidate for decannulation -Continue atropine drops, Glycopyrrolate per tube, scopolamine patch -Continue chest PT -Con't eliquis; anticipate he will need indefinitely -Overall prognosis remains poor.  Rest per primary team. PCCM will continue to follow weekly. Please call if needs arise sooner.  Melody Comas, MD [facility] Office: [phone]   See Amion for personal pager PCCM on call pager [phone] until  7pm. Please call Elink 7p-7a. [phone]

## 2021-09-11 NOTE — TOC Progression Note (Addendum)
Transition of Care [facility]) - Progression Note    Patient Details  Name: Billy Cherry MRN: [id number] Date of Birth: January 17, 1971  Transition of Care [facility]) CM/SW Contact  Janae Bridgeman, RN Phone Number: 09/11/2021, 11:31 AM  Clinical Narrative:    CM called and spoke with Jess Barters, CM with [facility] company is waiting to obtain insurance authorization through patient's Medicaid to be able to provide private duty nursing services at the home.  All available DME equipment is present at the home - other than air mattress for hospital bed.  [employer] is aware and will call the company to deliver prior to discharge to home - potential discharge date to the home set for 09/18/2021 per [employer].  RN/LPN staffing has been obtained through [facility] and will be available to start once insurance authorization has been obtained.  Jenness Corner, CM with [employer] was updated regarding possible discharge to home next week.  The [employer] office is aware that patient will potentially discharge to home after insurance authorization is approved and will plan to deliver the air mattress to the home prior to discharge date.  I asked to have [employer] check about availability for coverage for pulse oximeter for the home if requested - will follow up.  CM and MSW with DTP Team will continue to follow the patient for discharge planning to home - potential discharge date at this time - 09/18/2021.  09/11/2021 - [employer] called back and states that pulse oximeter for home is not covered through commercial insurance provider at this time.   Expected Discharge Plan: Home w Home Health Services Barriers to Discharge: Other (must enter comment) (Patient's wife - requested private duty services through alternative provider for 12 hours shifts for staffing at the home.)  Expected Discharge Plan and Services Expected Discharge Plan: Home w Home Health Services In-house Referral: Clinical Social  Work Discharge Planning Services: CM Consult, Follow-up appt scheduled Post Acute Care Choice: Home Health Living arrangements for the past 2 months: Single Family Home Expected Discharge Date: 08/07/21               DME Arranged: Janina Mayo supplies, Tube feeding, Suction, Hospital bed, Oxygen DME Agency: [employer]       HH Arranged: RN (Private Duty Nursing through [employer] versus [place]) HH Agency: [employer] Services Date HH Agency Contacted: 07/04/21 Time HH Agency Contacted: 1000 Representative spoke with at [facility] Agency: Jeanmarie Plant, Director with [employer] [phone]   Social Determinants of Health (SDOH) Interventions    Readmission Risk Interventions    04/14/2021   11:14 AM  Readmission Risk Prevention Plan  Transportation Screening Complete  HRI or Home Care Consult Complete  Social Work Consult for Recovery Care Planning/Counseling Complete  Palliative Care Screening Complete  Medication Review Oceanographer) Complete

## 2021-09-11 NOTE — Progress Notes (Signed)
Progress Note    Billy Cherry   [id number]  DOB: 1970-03-10  DOA: 03/30/2021     165 PCP: Patient, No Pcp Per  Initial CC: found unresponsive   Hospital Course: Patient is a 51 years old male with past medical history of hypertension who was brought into the hospital after being found unresponsive by his wife.  Patient was initially admitted to [facility] hospital for out of hospital cardiac arrest and ventricular fibrillation with respiratory arrest suspected from angioedema from ACE inhibitor leading to asphyxiation.  Patient was then subsequently transferred to [facility].  Below is the Sequence of events during hospitalization,  1/19 left heart cath without significant CAD.  Concern for anoxic brain injury 1/21: MRI shows areas of hypoxic-ischemic injury bilaterally. Fevered; cultures obtained. 1/23 severe brain damage 1/25: PEG . Neuro exam unchanged.  Does exhibit new eyelid tremor like movements and lip/jaw tremor like vs clonus movement.  Bolused Keppra and increased maintenance dose.  Repeat EEG. CTH: concerning for progression of anoxic injury.  1/26: Repeat EEG unchanged and continues to show myoclonic seizures despite addition of Valproic acid yesterday.  03/30/2021 Transfer to [name] from [place] for continuous EEG 1/28: Continues to have myoclonic sz on EEG 2/3 no meaningful neuro recovery. Diuresed 2/6 New fever to 102, WBC 16K - treated with vancomycin for 7d based on cultures.  2/11: percutaneous tracheostomy 2/13 >2/20 on trach collar. No vent.  2/20 Trach change to 6 cuffless 2/28: fever 3/13: eliquis started 4/3: fever 06/06/2021 CT of the chest with new PE and haziness consistent with pneumonia 4/5-4/9: responding well to PRN lasix 4/11 fever again after completion of abx- work up in progress 4/13 continues to have low-grade temps cultures from 4/11 no growth to date-suspect silent/occult aspiration-heparin changed back to Eliquis 4/13 4/14: Work-up  negative with no growth to date but red menance Tmax elevated 99.4 and respiratory rate up-x-ray did not show         any pneumonia but probably silently aspirating 08/04/21- ongoing.  Patient remains unresponsive.  Awaiting for placement and disposition has been difficult 6/20 -> difficult placement team working on options, [facility]? 6/21 > 6/27: positive blood cultures (staph capitis/epidermidis), likely contamination. No antibiotics initiated.  Interval History:  No events overnight. Laying in bed with nonpurposeful eye movements. Does not react to stimuli.  Nothing new today.   Assessment and Plan:  S/p Cardiac arrest Postarrest anoxic encephalopathy Postarrest respiratory failure s/p tracheostomy S/P PEG tube placement 2D echocardiogram showed reduced LV function at <20% with global hypokinesis.  Now status post trach and PEG.  Remains with anoxic encephalopathy. -Continue Keppra - poor prognosis overall; professional recommendation is transition to hospice/comfort care   Anoxic encephalopathy ([place]) MRI brain significant for hypoxic brain injury.  Nonpurposeful eye movements and unable to interact in any meaningful capacity.  Poor neurologic status has remained unchanged - Patient not likely to have any significant meaningful recovery given prolonged length of hospitalization - TENTATIVE dispo plan being pursued for patient to go home with [facility] and care of his wife   Acute respiratory failure with hypoxia Status post trach collar. Now chronic. -Continue treatment for secretions with atropine, scopolamine, and Robinul - at risk for recurrent infections   RLL pneumonia Previously had completed vancomycin for MSSA and [name] in early march.  Received more than 7-day course of IV Fortaz. On 4/3, CT chest was ordered that indicated an area of alveolar infiltration that may represent pneumonia.  Tracheal  aspirate has been sent for culture and is significant for  normal respiratory flora. Concerning for aspiration. Completed course of Zosyn. Patient with worsening secretions. Chest x-ray on 6/11 concerning for possible pneumonia. Patient retreated for pneumonia with Cefepime x7 days. Patient has developed worsening secretions. CT chest negative for recurrent pneumonia. Aggressive pulmonary toileting. - at risk for recurrent infections and imminent decline    Positive blood cultures Tmax of 100 F on 6/19. No clear source, although patient has had multiple episodes of aspiration pneumonia. Blood cultures (6/20) obtained and are positive for staphylococcus capitis. Repeat blood cultures (6/20) preliminarily positive for GPC in clusters. Discussed with ID. Repeat cultures with different species of staphylococcus. Likely contaminant. Antibiotics held.   DVT (deep venous thrombosis) ([name]) Duplex ultrasound of lower extremity on 05/15/2021 showed age indeterminate deep vein thrombosis involving the left femoral vein, left proximal profunda vein, left posterior tibial veins, left popliteal vein, and left peroneal veins. -Continue Eliquis   Dysphagia On bolus feeds per dietitian -Aspiration precautions -Continue tube feeds   Emesis - resolved  Unsure of etiology. Tube feeds held and eventually resumed. Abdominal x-ray without clear etiology. Obtained CT abdomen/pelvis which did not identify an etiology for symptoms. -Zofran PRN   Pulmonary embolus ([name]) Noted to have possible acute on chronic pulmonary embolism on CT.  Also have some evidence of possible pulmonary hypertension.   -Continue Eliquis indefinitely    Pressure injury of skin Lateral penis, posterior right heel, right foot. Not present on admission.   HTN (hypertension) Blood pressure acceptable, follow  -Continue Coreg, Cardura, isosorbide dinitrate and hydralazine -PRN hydralazine   HFrEF (heart failure with reduced ejection fraction) ([name]) 2D echocardiogram-LVEF < 20%,  -Continue coreg,  imdur, spironolactone   Myoclonus Seen by neurology. Per neuro notes, suppressing his myoclonus will not ultimately change the prognosis. -Continue Keppra, Klonopin   Peripheral edema -will monitor     Old records reviewed in assessment of this patient  Antimicrobials:   DVT prophylaxis:   apixaban (ELIQUIS) tablet 5 mg   Code Status:   Code Status: Full Code  Mobility Assessment (last 72 hours)     Mobility Assessment     Row Name 09/09/21 2045 09/08/21 2152         Does patient have an order for bedrest or is patient medically unstable Yes- Bedfast (Level 1) - Complete Yes- Bedfast (Level 1) - Complete      What is the highest level of mobility based on the progressive mobility assessment? Level 1 (Bedfast) - Unable to balance while sitting on edge of bed Level 1 (Bedfast) - Unable to balance while sitting on edge of bed      Is the above level different from baseline mobility prior to current illness? No - Consider discontinuing PT/OT No - Consider discontinuing PT/OT               Disposition Plan:  Home with HH; [facility] Status is: Inpt  Objective: Blood pressure (!) 139/106, pulse 82, temperature 98.7 F (37.1 C), temperature source Oral, resp. rate 16, height 5\' 10"  (1.778 m), weight 97 kg, SpO2 94 %.  Examination:  Physical Exam Constitutional:      Comments: Chronically ill appearing gentleman laying in bed with no purposeful movements and unable to interact   HENT:     Head: Normocephalic.     Mouth/Throat:     Mouth: Mucous membranes are moist.  Eyes:     Pupils: Pupils are equal, round, and  reactive to light.  Neck:     Comments: Trach in place Cardiovascular:     Rate and Rhythm: Normal rate and regular rhythm.  Pulmonary:     Comments: Trach in place with coarse sounds Abdominal:     General: Bowel sounds are normal.     Palpations: Abdomen is soft.     Comments: PEG in place  Musculoskeletal:        General: No swelling.      Cervical back: No rigidity.  Skin:    General: Skin is warm.  Neurological:     Comments: Possible mild w/d to pain in LLE vs reflex. Does not w/d to pain otherwise, does not interact or follow commands. Does not track with eyes       Consultants:    Procedures:    Data Reviewed: No results found for this or any previous visit (from the past 24 hour(s)).  I have Reviewed nursing notes, Vitals, and Lab results since pt's last encounter. Pertinent lab results : see above I have reviewed the last note from staff over past 24 hours I have discussed pt's care plan and test results with nursing staff, case manager   LOS: 165 days   Lewie Chamber, MD [facility] 09/11/2021, 5:08 PM

## 2021-09-12 NOTE — Progress Notes (Signed)
Progress Note    Billy Cherry   [id number]  DOB: 1971-02-28  DOA: 03/30/2021     166 PCP: Patient, No Pcp Per  Initial CC: found unresponsive   Hospital Course: Patient is a 51 years old male with past medical history of hypertension who was brought into the hospital after being found unresponsive by his wife.  Patient was initially admitted to [facility] hospital for out of hospital cardiac arrest and ventricular fibrillation with respiratory arrest suspected from angioedema from ACE inhibitor leading to asphyxiation.  Patient was then subsequently transferred to [facility].  Below is the Sequence of events during hospitalization,  1/19 left heart cath without significant CAD.  Concern for anoxic brain injury 1/21: MRI shows areas of hypoxic-ischemic injury bilaterally. Fevered; cultures obtained. 1/23 severe brain damage 1/25: PEG . Neuro exam unchanged.  Does exhibit new eyelid tremor like movements and lip/jaw tremor like vs clonus movement.  Bolused Keppra and increased maintenance dose.  Repeat EEG. CTH: concerning for progression of anoxic injury.  1/26: Repeat EEG unchanged and continues to show myoclonic seizures despite addition of Valproic acid yesterday.  03/30/2021 Transfer to [facility] from [place] for continuous EEG 1/28: Continues to have myoclonic sz on EEG 2/3 no meaningful neuro recovery. Diuresed 2/6 New fever to 102, WBC 16K - treated with vancomycin for 7d based on cultures.  2/11: percutaneous tracheostomy 2/13 >2/20 on trach collar. No vent.  2/20 Trach change to 6 cuffless 2/28: fever 3/13: eliquis started 4/3: fever 06/06/2021 CT of the chest with new PE and haziness consistent with pneumonia 4/5-4/9: responding well to PRN lasix 4/11 fever again after completion of abx- work up in progress 4/13 continues to have low-grade temps cultures from 4/11 no growth to date-suspect silent/occult aspiration-heparin changed back to Eliquis 4/13 4/14: Work-up  negative with no growth to date but red menance Tmax elevated 99.4 and respiratory rate up-x-ray did not show         any pneumonia but probably silently aspirating 08/04/21- ongoing.  Patient remains unresponsive.  Awaiting for placement and disposition has been difficult 6/20 -> difficult placement team working on options, [facility]? 6/21 > 6/27: positive blood cultures (staph capitis/epidermidis), likely contamination. No antibiotics initiated.  Interval History:  No events overnight. Laying in bed with nonpurposeful eye movements. Does not react to stimuli.  Nothing new today.  I have not seen or heard of any visitors all week on service.   Assessment and Plan:  S/p Cardiac arrest Postarrest anoxic encephalopathy Postarrest respiratory failure s/p tracheostomy S/P PEG tube placement 2D echocardiogram showed reduced LV function at <20% with global hypokinesis.  Now status post trach and PEG.  Remains with anoxic encephalopathy. -Continue Keppra - poor prognosis overall; professional recommendation is transition to hospice/comfort care   Anoxic encephalopathy ([name]) MRI brain significant for hypoxic brain injury.  Nonpurposeful eye movements and unable to interact in any meaningful capacity.  Poor neurologic status has remained unchanged - Patient not likely to have any significant meaningful recovery given prolonged length of hospitalization - TENTATIVE dispo plan being pursued for patient to go home with [facility] and care of his wife   Acute respiratory failure with hypoxia Status post trach collar. Now chronic. -Continue treatment for secretions with atropine, scopolamine, and Robinul - at risk for recurrent infections   RLL pneumonia Previously had completed vancomycin for MSSA and [name] in early march.  Received more than 7-day course of IV Fortaz. On 4/3, CT chest was  ordered that indicated an area of alveolar infiltration that may represent pneumonia.   Tracheal aspirate has been sent for culture and is significant for normal respiratory flora. Concerning for aspiration. Completed course of Zosyn. Patient with worsening secretions. Chest x-ray on 6/11 concerning for possible pneumonia. Patient retreated for pneumonia with Cefepime x7 days. Patient has developed worsening secretions. CT chest negative for recurrent pneumonia. Aggressive pulmonary toileting. - at risk for recurrent infections and imminent decline    Positive blood cultures Tmax of 100 F on 6/19. No clear source, although patient has had multiple episodes of aspiration pneumonia. Blood cultures (6/20) obtained and are positive for staphylococcus capitis. Repeat blood cultures (6/20) preliminarily positive for GPC in clusters. Discussed with ID. Repeat cultures with different species of staphylococcus. Likely contaminant. Antibiotics held.   DVT (deep venous thrombosis) ([name]) Duplex ultrasound of lower extremity on 05/15/2021 showed age indeterminate deep vein thrombosis involving the left femoral vein, left proximal profunda vein, left posterior tibial veins, left popliteal vein, and left peroneal veins. -Continue Eliquis   Dysphagia On bolus feeds per dietitian -Aspiration precautions -Continue tube feeds   Emesis - resolved  Unsure of etiology. Tube feeds held and eventually resumed. Abdominal x-ray without clear etiology. Obtained CT abdomen/pelvis which did not identify an etiology for symptoms. -Zofran PRN   Pulmonary embolus ([name]) Noted to have possible acute on chronic pulmonary embolism on CT.  Also have some evidence of possible pulmonary hypertension.   -Continue Eliquis indefinitely    Pressure injury of skin Lateral penis, posterior right heel, right foot. Not present on admission.   HTN (hypertension) Blood pressure acceptable, follow  -Continue Coreg, Cardura, isosorbide dinitrate and hydralazine -PRN hydralazine   HFrEF (heart failure with reduced  ejection fraction) ([name]) 2D echocardiogram-LVEF < 20%,  -Continue coreg, imdur, spironolactone   Myoclonus Seen by neurology. Per neuro notes, suppressing his myoclonus will not ultimately change the prognosis. -Continue Keppra, Klonopin   Peripheral edema -will monitor     Old records reviewed in assessment of this patient  Antimicrobials:   DVT prophylaxis:   apixaban (ELIQUIS) tablet 5 mg   Code Status:   Code Status: Full Code  Mobility Assessment (last 72 hours)     Mobility Assessment     Row Name 09/12/21 0800 09/11/21 2050 09/09/21 2045       Does patient have an order for bedrest or is patient medically unstable Yes- Bedfast (Level 1) - Complete Yes- Bedfast (Level 1) - Complete Yes- Bedfast (Level 1) - Complete     What is the highest level of mobility based on the progressive mobility assessment? Level 1 (Bedfast) - Unable to balance while sitting on edge of bed Level 1 (Bedfast) - Unable to balance while sitting on edge of bed Level 1 (Bedfast) - Unable to balance while sitting on edge of bed     Is the above level different from baseline mobility prior to current illness? No - Consider discontinuing PT/OT No - Consider discontinuing PT/OT No - Consider discontinuing PT/OT              Disposition Plan:  Home with [place]; [place] Status is: Inpt  Objective: Blood pressure (!) 136/91, pulse 75, temperature 98 F (36.7 C), temperature source Oral, resp. rate 14, height 5\' 10"  (1.778 m), weight 97 kg, SpO2 95 %.  Examination:  Physical Exam Constitutional:      Comments: Chronically ill appearing gentleman laying in bed with no purposeful movements and unable to interact  HENT:     Head: Normocephalic.     Mouth/Throat:     Mouth: Mucous membranes are moist.  Eyes:     Pupils: Pupils are equal, round, and reactive to light.  Neck:     Comments: Trach in place Cardiovascular:     Rate and Rhythm: Normal rate and regular rhythm.  Pulmonary:      Comments: Trach in place with coarse sounds Abdominal:     General: Bowel sounds are normal.     Palpations: Abdomen is soft.     Comments: PEG in place  Musculoskeletal:        General: No swelling.     Cervical back: No rigidity.  Skin:    General: Skin is warm.  Neurological:     Comments: Possible mild w/d to pain in LLE vs reflex. Does not w/d to pain otherwise, does not interact or follow commands. Does not track with eyes       Consultants:    Procedures:    Data Reviewed: No results found for this or any previous visit (from the past 24 hour(s)).  I have Reviewed nursing notes, Vitals, and Lab results since pt's last encounter. Pertinent lab results : see above I have reviewed the last note from staff over past 24 hours I have discussed pt's care plan and test results with nursing staff, case manager   LOS: 166 days   Lewie Chamber, MD [facility] 09/12/2021, 1:23 PM

## 2021-09-13 LAB — COMPREHENSIVE METABOLIC PANEL
ALT: 23 U/L (ref 0–44)
AST: 25 U/L (ref 15–41)
Albumin: 2.3 g/dL — ABNORMAL LOW (ref 3.5–5.0)
Alkaline Phosphatase: 71 U/L (ref 38–126)
Anion gap: 9 (ref 5–15)
BUN: 23 mg/dL — ABNORMAL HIGH (ref 6–20)
CO2: 26 mmol/L (ref 22–32)
Calcium: 9 mg/dL (ref 8.9–10.3)
Chloride: 98 mmol/L (ref 98–111)
Creatinine, Ser: 0.67 mg/dL (ref 0.61–1.24)
GFR, Estimated: >60 mL/min (ref >60)
Glucose, Bld: 123 mg/dL — ABNORMAL HIGH (ref 70–99)
Potassium: 4.2 mmol/L (ref 3.5–5.1)
Sodium: 133 mmol/L — ABNORMAL LOW (ref 135–145)
Total Bilirubin: 0.4 mg/dL (ref 0.3–1.2)
Total Protein: 6.4 g/dL — ABNORMAL LOW (ref 6.5–8.1)

## 2021-09-13 LAB — CBC WITH DIFFERENTIAL/PLATELET
Abs Immature Granulocytes: 0.03 10*3/uL (ref 0.00–0.07)
Basophils Absolute: 0 10*3/uL (ref 0.0–0.1)
Basophils Relative: 0 %
Eosinophils Absolute: 0.1 10*3/uL (ref 0.0–0.5)
Eosinophils Relative: 2 %
HCT: 40.9 % (ref 39.0–52.0)
Hemoglobin: 13.2 g/dL (ref 13.0–17.0)
Immature Granulocytes: 1 %
Lymphocytes Relative: 21 %
Lymphs Abs: 1.1 10*3/uL (ref 0.7–4.0)
MCH: 30.6 pg (ref 26.0–34.0)
MCHC: 32.3 g/dL (ref 30.0–36.0)
MCV: 94.7 fL (ref 80.0–100.0)
Monocytes Absolute: 0.7 10*3/uL (ref 0.1–1.0)
Monocytes Relative: 13 %
Neutro Abs: 3.3 10*3/uL (ref 1.7–7.7)
Neutrophils Relative %: 63 %
Platelets: 198 10*3/uL (ref 150–400)
RBC: 4.32 MIL/uL (ref 4.22–5.81)
RDW: 15.9 % — ABNORMAL HIGH (ref 11.5–15.5)
WBC: 5.2 10*3/uL (ref 4.0–10.5)
nRBC: 0 % (ref 0.0–0.2)

## 2021-09-13 LAB — MAGNESIUM: Magnesium: 1.7 mg/dL (ref 1.7–2.4)

## 2021-09-13 LAB — PHOSPHORUS: Phosphorus: 4 mg/dL (ref 2.5–4.6)

## 2021-09-13 MED ORDER — CLONAZEPAM 0.5 MG PO TABS
0.5000 mg | ORAL_TABLET | Freq: Three times a day (TID) | ORAL | Status: DC
  Administered 2021-09-14 – 2021-10-04 (×61): 0.5 mg
  Filled 2021-09-13 (×61): qty 1

## 2021-09-13 NOTE — Plan of Care (Signed)
Suctioned frequently, has strong cough reflex.  Problem: Clinical Measurements: Goal: Respiratory complications will improve Outcome: Progressing

## 2021-09-13 NOTE — Progress Notes (Addendum)
[facility] PROGRESS NOTE  Patient: Billy Cherry [id number]   PCP: Patient, No Pcp Per DOB: [birth year]   DOA: 03/30/2021   DOS: 09/13/2021    Subjective: No nausea no vomiting no fever no chills.  Patient is not tracking  Objective:  Vitals:   09/13/21 1556 09/13/21 1741  BP:  (!) 126/98  Pulse:  91  Resp:  17  Temp:  98.6 F (37 C)  SpO2: 95% 95%    Alert, not tracking, unable to follow any commands, nonverbal, S1-S2 present. Bilateral rhonchi heard. Bowel sounds present. No edema.  Assessment and plan: Out-of-hospital cardiac arrest Anoxic brain injury. Appears to remain stable on tracheostomy tube. Not a candidate for decannulation. Continue current care for now.  CBC and CMP were ordered, appears to be remaining stable.  Author: Lynden Oxford, MD [facility] 09/13/2021 7:03 PM   If 7PM-7AM, please contact night-coverage at [email or web address]

## 2021-09-14 ENCOUNTER — Other Ambulatory Visit (HOSPITAL_COMMUNITY): Payer: Self-pay

## 2021-09-14 NOTE — Progress Notes (Signed)
Mobility Specialist Progress Note:   09/14/21 1340  Mobility  Activity Turned to left side;Turned to right side  Level of Assistance Total care  Assistive Device None  Activity Response Tolerated well  $Mobility charge 1 Mobility   NT requesting assistance with pericare. Pt left turned to left side.   Billy Cherry Acute Rehab Secure Chat or Office Phone: [phone]

## 2021-09-14 NOTE — Progress Notes (Signed)
[facility] PROGRESS NOTE  Patient: Billy Cherry [id number]   PCP: Patient, No Pcp Per DOB: 10/20/1970   DOA: 03/30/2021   DOS: 09/14/2021    Subjective: No acute events overnight.  Patient continues to not track or withdraws to painful stimuli.  Objective:  Vitals:   09/14/21 1534 09/14/21 1640  BP: (!) 127/94   Pulse: 74 82  Resp: 19 19  Temp: 98.1 F (36.7 C)   SpO2: 92% 94%    Bilateral crackles and rhonchi heard. Bowel sound present No edema. No purposeful movement.  Assessment and plan: Out-of-hospital cardiac arrest with anoxic brain injury. Remains stable on tracheostomy tube. Continue frequent suctioning. Family wants to take the patient home but Social worker assistance appreciated.  Author: Lynden Oxford, MD [facility] 09/14/2021 6:33 PM   If 7PM-7AM, please contact night-coverage at [email or web address]

## 2021-09-14 NOTE — TOC Progression Note (Addendum)
Transition of Care [facility]) - Progression Note    Patient Details  Name: Billy Cherry MRN: [id number] Date of Birth: June 03, 1970  Transition of Care [facility]) CM/SW [place], RN Phone Number: 09/14/2021, 12:32 PM  Clinical Narrative:    CM called and spoke with Phillips Odor, Admissions CM with [name] and he states that he is waiting on insurance authorization through [employer] for Private duty nursing.  [employer] duty has RN/LPN coverage at the home at this time including - Monday 11 hrs daytime hours, Tuesday,  11 hr day time hours, Wednesday, 11 hrs daytime hours, and Friday - 11 hrs of daytime hours.  Discharge date to home is planned for Thursday, 09/21/2021.  Maxim states that [employer] and they will be coordinating PM/ Weekend staffing to assist.  The patient's Medicaid should have availability to cover private duty nursing up to 84 hours per week and the patient's family will be responsible for care at the home outside of those hours.    I met with Baldemar Friday, CM with [facility] [phone] and she will be coordinating staffing but not likely before patient is discharged home.  Clinicals were provided to her including face-sheet, WOC RN notes, and recent progress notes.  I called and spoke with the patient's wife, [name] and updated her on the likely discharge date to home.  I explained that I will call [employer] and have them reach out to her to coordinate delivering of the air mattress to the home next Tuesday or Wednesday.  The patient's wife received prior tracheostomy/ PEG tube teaching while the patient was admitted on [address] [facility] and is knowledgeable of performing care for the patient in the home.   09/14/2021 1447 - I called and left a message with the patient's PCP - [facility] [phone] and updated them that the patient would likely discharge home next week and I would be glad to send updated clinicals as needed prior to  discharge.  CM and MSW with DTP Team will continue to follow the patient for discharge to home - likely next Thursday, 09/21/2021.      Expected Discharge Plan: [place] Services Barriers to Discharge: Other (must enter comment) (Patient's wife - requested private duty services through alternative provider for 12 hours shifts for staffing at the home.)  Expected Discharge Plan and Services Expected Discharge Plan: [place] In-house Referral: Clinical Social Work Discharge Planning Services: CM Consult, Follow-up appt scheduled Post Acute Care Choice: [place] arrangements for the past 2 months: Single Family Home Expected Discharge Date: 08/07/21               DME Arranged: Lurline Idol supplies, Tube feeding, Suction, Hospital bed, Oxygen DME Agency: [employer]       HH Arranged: RN (Private Duty Nursing through [employer] versus [place]) [place]: [place] Date [place]: 07/04/21 Time [place] Contacted: 1000 Representative spoke with at [place]: Feliz Beam, Director with [employer] [phone]   Social Determinants of Health ([place]) Interventions    Readmission Risk Interventions    04/14/2021   11:14 AM  Readmission Risk Prevention Plan  Transportation Screening Complete  HRI or [place] Complete  Social Work Consult for [place] Planning/Counseling Complete  Palliative Care Screening Complete  Medication Review Press photographer) Complete

## 2021-09-15 NOTE — Plan of Care (Signed)
  Problem: Education: Goal: Knowledge of General Education information will improve Description Including pain rating scale, medication(s)/side effects and non-pharmacologic comfort measures Outcome: Progressing   

## 2021-09-15 NOTE — Progress Notes (Signed)
Progress Note Patient: Billy Cherry OEV:[id number] DOB: 1970/05/20 DOA: 03/30/2021  DOS: the patient was seen and examined on 09/15/2021  Brief Cherry course: Patient is a 51 years old male with past medical history of hypertension who was brought into the [facility] after being found unresponsive by his wife.  Patient was initially admitted to [facility] for out of Cherry cardiac arrest and ventricular fibrillation with respiratory arrest suspected from angioedema from ACE inhibitor leading to asphyxiation.  Patient was then subsequently transferred to [facility].  Below is the Sequence of events during hospitalization,  1/19 admitted to [facility], Left heart cath without significant CAD.  Concern for anoxic brain injury 1/21: MRI shows areas of hypoxic-ischemic injury bilaterally. Fevered; cultures obtained. 1/25: Neuro exam unchanged.  Does exhibit new eyelid tremor like movements and lip/jaw tremor like vs clonus movement.  Bolused Keppra and increased maintenance dose.  Repeat EEG. CTH: concerning for progression of anoxic injury.  1/26: Repeat EEG unchanged and continues to show myoclonic seizures despite addition of Valproic acid, Transfer to [facility] from [facility] for continuous EEG 1/28: Continues to have myoclonic sz on EEG 2/6 New fever to 102, WBC 16K - treated with vancomycin for 7d based on cultures.  2/11: percutaneous tracheostomy 2/13 >2/20 on trach collar. No vent.  2/20 Trach change to 6 cuffless 2/22 PEG tube placement. 3/13: Doppler positive for left leg DVT, eliquis started 06/06/2021 CT of the chest with new PE and haziness consistent with pneumonia, patient was briefly on heparin but was placed back on Eliquis per [facility] 4/05-4/11 treated with Unasyn for aspiration pneumonia.  Continues to have low-grade temps, suspect silent/occult aspiration 08/04/21- Patient remains unresponsive.  Awaiting for placement and disposition has been difficult 6/21 > 6/27:  positive blood cultures (staph capitis/epidermidis), likely contamination. No antibiotics initiated.  Now plan is to go home with home therapy.  Social worker and case manager is currently working on the case.  Plan to discharge potentially on 7/20.  Assessment and Plan: Out-of-Cherry cardiac arrest Postarrest anoxic encephalopathy Postarrest respiratory failure s/p tracheostomy S/P PEG tube placement Suspected reason for cardiac arrest possible angioedema from ACE inhibitor Left heart cath at [facility] shows no significant coronary artery disease 2D echocardiogram showed reduced LV function at <20% with global hypokinesis.   Now status post trach and PEG.   Remains with anoxic encephalopathy. Continue Keppra - poor prognosis overall; professional recommendation is transition to hospice/comfort care   Anoxic encephalopathy ([name]) MRI brain significant for hypoxic brain injury.  Nonpurposeful eye movements and unable to interact in any meaningful capacity. Poor neurologic status has remained unchanged - TENTATIVE dispo plan being pursued for patient to go home with [facility] and care of his wife   Acute respiratory failure with hypoxia S/P tracheostomy status Status post trach collar. Now chronic trach Not a candidate for decannulation. -Continue treatment for secretions with atropine, scopolamine, and Robinul - at risk for recurrent infections   Recurrent pneumonia Previously had completed vancomycin for MSSA and [name] in early march.   Received more than 7-day course of IV Fortaz.  In April completed a course of IV Unasyn for aspiration pneumonia. Chest x-ray on 6/11 concerning for possible pneumonia. Patient retreated for pneumonia with Cefepime x7 days.  Aggressive pulmonary toileting. at risk for recurrent infections.   Excessive respiratory secretions. Currently being managed with frequent suctioning along with scopolamine patch scheduled Robinul and  atropine drops.  Staph capitis in blood-contamination Blood cultures (6/20) are positive  for staphylococcus capitis. Repeat culture on 6/21 grew Staph epidermidis. Prior provider discussed with ID.  Given back-to-back cultures growing different species of staphylococcus, it was felt that this is mostly contaminant.  Currently no clinical concern for ongoing active infection.   DVT (deep venous thrombosis) and PE Duplex ultrasound of lower extremity on 05/15/2021 showed age indeterminate deep vein thrombosis involving the left femoral vein, left proximal profunda vein, left posterior tibial veins, left popliteal vein, and left peroneal veins. CT scan in April also showed possibility of pulmonary embolism. -Continue Eliquis  Dysphagia Patient was on bolus feeds due to concerns with recurrent aspiration currently on continuous feed since 6/30. -Aspiration precautions -Continue tube feeds   Pressure injury of skin Lateral penis, posterior right heel, right foot. Not present on admission. Continue dressing changes.  HTN (hypertension) Blood pressure acceptable, follow  -Continue current regimen.   HFrEF (heart failure with reduced ejection fraction) ([name]) 2D echocardiogram-LVEF < 20%,  -Continue coreg, imdur, spironolactone -Not a candidate for ACE I/ARB/ARN I given life-threatening angioedema -Consider SGLT2 inhibitor as well as repeating echocardiogram -Currently no indication for Lasix as volume level appears adequate.  Although has been treated in the past with Lasix as needed.  Will discharge with as needed Lasix.   Intermittent myoclonic seizures Underwent multiple EEG as well as long-term EEG monitoring. Neurology was consulted regarding. Seen by neurology.  Signed off on 1/30 Per neuro notes, " Patient has suffered significant neurologic injury with minimal to no chances of meaningful brain recovery. treating those would not improve patient's outcome.  Therefore, I would  recommend continuing current AEDs for now.  If patient has increased frequency of myoclonic seizures, Klonopin can be increased"   Class I obesity Body mass index is 30.37 kg/m.  Placing the pt at higher risk of poor outcomes.  Goals of care conversation. Palliative care was consulted and signed off on 2/13.  Family continues to pursue full scope of intervention and medical care.  Subjective: Minimally responsive.  No acute events overnight.  Physical Exam: Vitals:   09/15/21 0827 09/15/21 1201 09/15/21 1352 09/15/21 1604  BP:   (!) 139/107 (!) 118/98  Pulse: 97 93 88 85  Resp: 18 14 18 16   Temp:   98.8 F (37.1 C) 98.6 F (37 C)  TempSrc:   Oral Oral  SpO2: 100% 100% 99% 98%  Weight:      Height:       General: Appear in mild distress; no visible Abnormal Neck Mass Or lumps, Conjunctiva normal Cardiovascular: S1 and S2 Present, no Murmur, Respiratory: good respiratory effort, Bilateral Air entry present and bilateral  Crackles, no wheezes Abdomen: Bowel Sound present, difficult to assess Extremities: no Pedal edema Neurology: lethargic and non verbal no tracking, unable to follow any commands unchanged from prior Gait not checked due to patient safety concerns   Data Reviewed:  Family Communication: Discussed with wife on the phone on 7/14  Disposition: Status is: Inpatient Remains inpatient appropriate because: Awaiting safe discharge plan.  Author: 8/14, MD 09/15/2021 4:51 PM  Please look on [email or web address] to find out who is on call.

## 2021-09-15 NOTE — TOC Progression Note (Addendum)
Transition of Care [facility]) - Progression Note    Patient Details  Name: Billy Cherry MRN: [id number] Date of Birth: [birth year]  Transition of Care [facility]) CM/SW Contact  Janae Bridgeman, RN Phone Number: 09/15/2021, 12:47 PM  Clinical Narrative:    CM called and spoke with the patient's wife, Theola Sequin, on the phone to update regarding likely discharge to the home next Thursday, 09/21/2021.  Colette Ribas, wife was updated regarding the PCP to follow up for post hospitalization home visits.  This information was included on the discharge instructions as well.  The patient's wife is aware that [employer] is waiting on insurance authorization to be approved before patient is discharged home with private duty nursing for Maximum of 84 hours per week.  At this time, [place] [place] has hired a RN/LPN to cover for the patient's care in the home for Monday 7 am to 6 pm, Tuesday, 7 am to 6 pm, Wednesday - 7 am to 6 pm and Friday 7 am to 6 pm.  Saturday hours are pending at this time.  [facility] will be coordinating pm nursing hours and the wife is aware that this has not been established at this time and may not be coordinated until after the patient has been discharged home.  I called the [facility] and updated the PCP regarding possible discharge date for 09/21/21.  The PCP has patient as an active pending patient at this time and plans to follow up in the home within 1 week of patient's discharge to the home for a home visit.  Discharge summary will be sent to the PCP prior to discharge to home.  Patient will be transportation home by [employer] transport or [facility] transport.  I will follow up next week with [employer] to assume air mattress is delivered to the home 48 hours prior to discharge.  Also, the patient was changed to continuous tube feedings.  I placed a dme order for tube feeding pump and called Jenness Corner, CM with [employer] and updated him that the patient would discharge  home likely next Thursday and a feeding pump would need to be delivered to the home along with an air mattress.  [employer] will also deliver formula to the home for the tube feedings.  CM and MSW with DTP Team will continue to follow the patient for discharge plans to home next week.   Expected Discharge Plan: Home w Home Health Services Barriers to Discharge: Other (must enter comment) (Patient's wife - requested private duty services through alternative provider for 12 hours shifts for staffing at the home.)  Expected Discharge Plan and Services Expected Discharge Plan: Home w Home Health Services In-house Referral: Clinical Social Work Discharge Planning Services: CM Consult, Follow-up appt scheduled Post Acute Care Choice: Home Health Living arrangements for the past 2 months: Single Family Home Expected Discharge Date: 08/07/21               DME Arranged: Janina Mayo supplies, Tube feeding, Suction, Hospital bed, Oxygen DME Agency: [employer]       HH Arranged: RN (Private Duty Nursing through [employer] versus [place]) HH Agency: [employer] Services Date HH Agency Contacted: 07/04/21 Time HH Agency Contacted: 1000 Representative spoke with at [facility] Agency: Jeanmarie Plant, Director with [employer] - [phone]   Social Determinants of Health (SDOH) Interventions    Readmission Risk Interventions    04/14/2021   11:14 AM  Readmission Risk Prevention Plan  Transportation Screening Complete  HRI or  Home Care Consult Complete  Social Work Consult for [place] Care Planning/Counseling Complete  Palliative Care Screening Complete  Medication Review Oceanographer) Complete

## 2021-09-16 NOTE — Plan of Care (Signed)
  Problem: Education: Goal: Knowledge of General Education information will improve Description Including pain rating scale, medication(s)/side effects and non-pharmacologic comfort measures Outcome: Progressing   

## 2021-09-16 NOTE — Progress Notes (Signed)
Progress Note Patient: Billy Cherry FOY:[id number] DOB: 11-15-1970 DOA: 03/30/2021  DOS: the patient was seen and examined on 09/16/2021  Brief hospital course: [name] 51 years old male with past medical history of hypertension who was brought into the hospital after being found unresponsive by his wife.  Patient was initially admitted to [facility] hospital for out of hospital cardiac arrest and ventricular fibrillation with respiratory arrest suspected from angioedema from ACE inhibitor leading to asphyxiation.  Patient was then subsequently transferred to [facility].  Below is the Sequence of events during hospitalization,  1/19 admitted to [facility] ICU, Left heart cath without significant CAD.  Concern for anoxic brain injury 1/21: MRI shows areas of hypoxic-ischemic injury bilaterally. Fevered; cultures obtained. 1/25: Neuro exam unchanged.  Does exhibit new eyelid tremor like movements and lip/jaw tremor like vs clonus movement.  Bolused Keppra and increased maintenance dose.  Repeat EEG. CTH: concerning for progression of anoxic injury.  1/26: Repeat EEG unchanged and continues to show myoclonic seizures despite addition of Valproic acid, Transfer to [facility] from [facility] for continuous EEG 1/28: Continues to have myoclonic sz on EEG 2/6 New fever to 102, WBC 16K - treated with vancomycin for 7d based on cultures.  2/11: percutaneous tracheostomy 2/13 >2/20 on trach collar. No vent.  2/20 Trach change to 6 cuffless 2/22 PEG tube placement. 3/13: Doppler positive for left leg DVT, eliquis started 06/06/2021 CT of the chest with new PE and haziness consistent with pneumonia, patient was briefly on heparin but was placed back on Eliquis per [facility] 4/05-4/11 treated with Unasyn for aspiration pneumonia.  Continues to have low-grade temps, suspect silent/occult aspiration 08/04/21- Patient remains unresponsive.  Awaiting for placement and disposition has been difficult 6/21 > 6/27:  positive blood cultures (staph capitis/epidermidis), likely contamination. No antibiotics initiated.  Now plan is to go home with home therapy.  Social worker and case manager is currently working on the case.  Plan to discharge potentially on 7/20.  Assessment and Plan: Out-of-hospital cardiac arrest Postarrest anoxic encephalopathy Postarrest respiratory failure s/p tracheostomy S/P PEG tube placement Suspected reason for cardiac arrest possible angioedema from ACE inhibitor Left heart cath at [facility] shows no significant coronary artery disease 2D echocardiogram showed reduced LV function at <20% with global hypokinesis.   Now status post trach and PEG.   Remains with anoxic encephalopathy. Continue Keppra - poor prognosis overall; professional recommendation is transition to hospice/comfort care   Anoxic encephalopathy ([name]) MRI brain significant for hypoxic brain injury.  Nonpurposeful eye movements and unable to interact in any meaningful capacity. Poor neurologic status has remained unchanged - TENTATIVE dispo plan being pursued for patient to go home with [facility] and care of his wife   Acute respiratory failure with hypoxia S/P tracheostomy status Status post trach collar. Now chronic trach Not Keeghan Bialy candidate for decannulation. -Continue treatment for secretions with atropine, scopolamine, and Robinul - at risk for recurrent infections   Recurrent pneumonia Previously had completed vancomycin for MSSA and [name] in early march.   Received more than 7-day course of IV Fortaz.  In April completed Lakiyah Arntson course of IV Unasyn for aspiration pneumonia. Chest x-ray on 6/11 concerning for possible pneumonia. Patient retreated for pneumonia with Cefepime x7 days.  Aggressive pulmonary toileting. at risk for recurrent infections.   Excessive respiratory secretions. Currently being managed with frequent suctioning along with scopolamine patch scheduled Robinul and  atropine drops.  Staph capitis in blood-contamination Blood cultures (6/20) are positive  for staphylococcus capitis. Repeat culture on 6/21 grew Staph epidermidis. Prior provider discussed with ID.  Given back-to-back cultures growing different species of staphylococcus, it was felt that this is mostly contaminant.  Currently no clinical concern for ongoing active infection.   DVT (deep venous thrombosis) and PE Duplex ultrasound of lower extremity on 05/15/2021 showed age indeterminate deep vein thrombosis involving the left femoral vein, left proximal profunda vein, left posterior tibial veins, left popliteal vein, and left peroneal veins. CT scan in April also showed possibility of pulmonary embolism. -Continue Eliquis  Dysphagia Patient was on bolus feeds due to concerns with recurrent aspiration currently on continuous feed since 6/30. -Aspiration precautions -Continue tube feeds   Pressure injury of skin Lateral penis, posterior right heel, right foot. Not present on admission. Continue dressing changes.  HTN (hypertension) Blood pressure acceptable, follow  -Continue current regimen.   HFrEF (heart failure with reduced ejection fraction) ([name]) 2D echocardiogram-LVEF < 20%,  -Continue coreg, imdur, spironolactone -Not Cassidey Barrales candidate for ACE I/ARB/ARN I given life-threatening angioedema -Consider SGLT2 inhibitor as well as repeating echocardiogram -Currently no indication for Lasix as volume level appears adequate.  Although has been treated in the past with Lasix as needed.  Will discharge with as needed Lasix.   Intermittent myoclonic seizures Underwent multiple EEG as well as long-term EEG monitoring. Neurology was consulted regarding. Seen by neurology.  Signed off on 1/30 Per neuro notes, " Patient has suffered significant neurologic injury with minimal to no chances of meaningful brain recovery. treating those would not improve patient's outcome.  Therefore, I would  recommend continuing current AEDs for now.  If patient has increased frequency of myoclonic seizures, Klonopin can be increased"   Class I obesity Body mass index is 29.73 kg/m.  Placing the pt at higher risk of poor outcomes.  Goals of care conversation. Palliative care was consulted and signed off on 2/13.  Family continues to pursue full scope of intervention and medical care.  Subjective: noneverbal  Physical Exam: Vitals:   09/16/21 0800 09/16/21 1212 09/16/21 1524 09/16/21 1611  BP:   (!) 116/94   Pulse:  86 88 97  Resp:  16 16 18   Temp:   98.3 F (36.8 C)   TempSrc:   Axillary   SpO2: 98% 98% 95% 97%  Weight:      Height:       General: No acute distress. Cardiovascular: RRR Lungs: trach Abdomen: PEG Neurological: eyes open, not tracking, not following commands Extremities: No clubbing or cyanosis. No edema.  Data Reviewed:  Family Communication: Dr. discussed with wife 7/14  Disposition: Status is: Inpatient Remains inpatient appropriate because: Awaiting safe discharge plan.  Author: 8/14, MD 09/16/2021 6:12 PM  Please look on [email or web address] to find out who is on call.

## 2021-09-17 ENCOUNTER — Inpatient Hospital Stay (HOSPITAL_COMMUNITY): Payer: BC Managed Care – PPO

## 2021-09-17 DIAGNOSIS — I469 Cardiac arrest, cause unspecified: Secondary | ICD-10-CM

## 2021-09-17 LAB — ECHOCARDIOGRAM LIMITED
Height: 70 in
Weight: 3421.54 oz

## 2021-09-17 LAB — GLUCOSE, CAPILLARY: Glucose-Capillary: 122 mg/dL — ABNORMAL HIGH (ref 70–99)

## 2021-09-17 NOTE — Plan of Care (Signed)
  Problem: Education: Goal: Knowledge of General Education information will improve Description Including pain rating scale, medication(s)/side effects and non-pharmacologic comfort measures Outcome: Progressing   

## 2021-09-17 NOTE — Progress Notes (Signed)
Progress Note Patient: Billy Cherry ENI:[id number] DOB: 15-May-1970 DOA: 03/30/2021  DOS: the patient was seen and examined on 09/17/2021  Brief hospital course: [name] 51 years old male with past medical history of hypertension who was brought into the hospital after being found unresponsive by his wife.  Patient was initially admitted to [facility] hospital for out of hospital cardiac arrest and ventricular fibrillation with respiratory arrest suspected from angioedema from ACE inhibitor leading to asphyxiation.  Patient was then subsequently transferred to [facility].  Below is the Sequence of events during hospitalization,  1/19 admitted to [facility] ICU, Left heart cath without significant CAD.  Concern for anoxic brain injury 1/21: MRI shows areas of hypoxic-ischemic injury bilaterally. Fevered; cultures obtained. 1/25: Neuro exam unchanged.  Does exhibit new eyelid tremor like movements and lip/jaw tremor like vs clonus movement.  Bolused Keppra and increased maintenance dose.  Repeat EEG. CTH: concerning for progression of anoxic injury.  1/26: Repeat EEG unchanged and continues to show myoclonic seizures despite addition of Valproic acid, Transfer to [facility] from [place] for continuous EEG 1/28: Continues to have myoclonic sz on EEG 2/6 New fever to 102, WBC 16K - treated with vancomycin for 7d based on cultures.  2/11: percutaneous tracheostomy 2/13 >2/20 on trach collar. No vent.  2/20 Trach change to 6 cuffless 2/22 PEG tube placement. 3/13: Doppler positive for left leg DVT, eliquis started 06/06/2021 CT of the chest with new PE and haziness consistent with pneumonia, patient was briefly on heparin but was placed back on Eliquis per [facility] 4/05-4/11 treated with Unasyn for aspiration pneumonia.  Continues to have low-grade temps, suspect silent/occult aspiration 08/04/21- Patient remains unresponsive.  Awaiting for placement and disposition has been difficult 6/21 > 6/27:  positive blood cultures (staph capitis/epidermidis), likely contamination. No antibiotics initiated.  Now plan is to go home with home therapy.  Social worker and case manager is currently working on the case.  Plan to discharge potentially on 7/20.  Assessment and Plan: Out-of-hospital cardiac arrest Postarrest anoxic encephalopathy Postarrest respiratory failure s/p tracheostomy S/P PEG tube placement Suspected reason for cardiac arrest possible angioedema from ACE inhibitor Left heart cath at [facility] shows no significant coronary artery disease 2D echocardiogram showed reduced LV function at <20% with global hypokinesis.   Now status post trach and PEG.   Remains with anoxic encephalopathy. Continue Keppra - poor prognosis overall; professional recommendation is transition to hospice/comfort care   Anoxic encephalopathy ([name]) MRI brain significant for hypoxic brain injury.  Nonpurposeful eye movements and unable to interact in any meaningful capacity. Poor neurologic status has remained unchanged - TENTATIVE dispo plan being pursued for patient to go home with [facility] and care of his wife   Acute respiratory failure with hypoxia S/P tracheostomy status Status post trach collar. Now chronic trach Not Joie Hipps candidate for decannulation. -Continue treatment for secretions with atropine, scopolamine, and Robinul - at risk for recurrent infections   Recurrent pneumonia Previously had completed vancomycin for MSSA and Billy Cherry in early march.   Received more than 7-day course of IV Fortaz.  In April completed Billy Cherry course of IV Unasyn for aspiration pneumonia. Chest x-ray on 6/11 concerning for possible pneumonia. Patient retreated for pneumonia with Cefepime x7 days.  Aggressive pulmonary toileting. at risk for recurrent infections.   Excessive respiratory secretions. Currently being managed with frequent suctioning along with scopolamine patch scheduled Robinul and  atropine drops.  Staph capitis in blood-contamination Blood cultures (6/20) are positive  for staphylococcus capitis. Repeat culture on 6/21 grew Staph epidermidis. Prior provider discussed with ID.  Given back-to-back cultures growing different species of staphylococcus, it was felt that this is mostly contaminant.  Currently no clinical concern for ongoing active infection.   DVT (deep venous thrombosis) and PE Duplex ultrasound of lower extremity on 05/15/2021 showed age indeterminate deep vein thrombosis involving the left femoral vein, left proximal profunda vein, left posterior tibial veins, left popliteal vein, and left peroneal veins. CT scan in April also showed possibility of pulmonary embolism. -Continue Eliquis  Dysphagia Patient was on bolus feeds due to concerns with recurrent aspiration currently on continuous feed since 6/30. -Aspiration precautions -Continue tube feeds   Pressure injury of skin Lateral penis, posterior right heel, right foot. Not present on admission. Continue dressing changes.  HTN (hypertension) Blood pressure acceptable, follow  -Continue current regimen.   HFrEF (heart failure with reduced ejection fraction) ([name]) 2D echocardiogram-LVEF < 20%,  -Continue coreg, imdur, spironolactone -Not Deen Deguia candidate for ACE I/ARB/ARN I given life-threatening angioedema -Consider SGLT2 inhibitor as well as repeating echocardiogram (EF 30-35%, small pericardial effusion) will discuss additional meds with wife prior to starting -Currently no indication for Lasix as volume level appears adequate.  Although has been treated in the past with Lasix as needed.  Will discharge with as needed Lasix.   Intermittent myoclonic seizures Underwent multiple EEG as well as long-term EEG monitoring. Neurology was consulted regarding. Seen by neurology.  Signed off on 1/30 Per neuro notes, " Patient has suffered significant neurologic injury with minimal to no chances of  meaningful brain recovery. treating those would not improve patient's outcome.  Therefore, I would recommend continuing current AEDs for now.  If patient has increased frequency of myoclonic seizures, Klonopin can be increased"   Class I obesity Body mass index is 30.68 kg/m.  Placing the pt at higher risk of poor outcomes.  Goals of care conversation. Palliative care was consulted and signed off on 2/13.  Family continues to pursue full scope of intervention and medical care.  Subjective: noneverbal  Physical Exam: Vitals:   09/17/21 0914 09/17/21 1154 09/17/21 1543 09/17/21 1552  BP: 117/80  (!) 146/96   Pulse: 85 88 82 78  Resp: 17 18 18 18   Temp: 98 F (36.7 C)  98.8 F (37.1 C)   TempSrc: Axillary  Axillary   SpO2: 98% 93% 95% 95%  Weight:      Height:       General: No acute distress. Cardiovascular: RRR Lungs: trach, lots of secretions Abdomen: peg Neurological: not following commands, no meaningful movement, eyes open Extremities: No clubbing or cyanosis. No edema.   Family Communication: Dr. discussed with wife 7/14  Disposition: Status is: Inpatient Remains inpatient appropriate because: Awaiting safe discharge plan.  Author: 8/14, MD 09/17/2021 4:24 PM  Please look on [email or web address] to find out who is on call.

## 2021-09-17 NOTE — Progress Notes (Signed)
  Echocardiogram 2D Echocardiogram has been performed.  Billy Cherry F 09/17/2021, 2:39 PM

## 2021-09-18 LAB — COMPREHENSIVE METABOLIC PANEL
ALT: 30 U/L (ref 0–44)
AST: 25 U/L (ref 15–41)
Albumin: 2.5 g/dL — ABNORMAL LOW (ref 3.5–5.0)
Alkaline Phosphatase: 65 U/L (ref 38–126)
Anion gap: 12 (ref 5–15)
BUN: 26 mg/dL — ABNORMAL HIGH (ref 6–20)
CO2: 27 mmol/L (ref 22–32)
Calcium: 9.3 mg/dL (ref 8.9–10.3)
Chloride: 97 mmol/L — ABNORMAL LOW (ref 98–111)
Creatinine, Ser: 0.79 mg/dL (ref 0.61–1.24)
GFR, Estimated: >60 mL/min (ref >60)
Glucose, Bld: 113 mg/dL — ABNORMAL HIGH (ref 70–99)
Potassium: 4.5 mmol/L (ref 3.5–5.1)
Sodium: 136 mmol/L (ref 135–145)
Total Bilirubin: 0.3 mg/dL (ref 0.3–1.2)
Total Protein: 7 g/dL (ref 6.5–8.1)

## 2021-09-18 LAB — CBC WITH DIFFERENTIAL/PLATELET
Abs Immature Granulocytes: 0.02 10*3/uL (ref 0.00–0.07)
Basophils Absolute: 0 10*3/uL (ref 0.0–0.1)
Basophils Relative: 0 %
Eosinophils Absolute: 0.1 10*3/uL (ref 0.0–0.5)
Eosinophils Relative: 2 %
HCT: 40.4 % (ref 39.0–52.0)
Hemoglobin: 13.2 g/dL (ref 13.0–17.0)
Immature Granulocytes: 0 %
Lymphocytes Relative: 26 %
Lymphs Abs: 1.6 10*3/uL (ref 0.7–4.0)
MCH: 30.1 pg (ref 26.0–34.0)
MCHC: 32.7 g/dL (ref 30.0–36.0)
MCV: 92.2 fL (ref 80.0–100.0)
Monocytes Absolute: 1.1 10*3/uL — ABNORMAL HIGH (ref 0.1–1.0)
Monocytes Relative: 17 %
Neutro Abs: 3.4 10*3/uL (ref 1.7–7.7)
Neutrophils Relative %: 55 %
Platelets: 195 10*3/uL (ref 150–400)
RBC: 4.38 MIL/uL (ref 4.22–5.81)
RDW: 15.6 % — ABNORMAL HIGH (ref 11.5–15.5)
WBC: 6.2 10*3/uL (ref 4.0–10.5)
nRBC: 0 % (ref 0.0–0.2)

## 2021-09-18 LAB — MAGNESIUM: Magnesium: 1.9 mg/dL (ref 1.7–2.4)

## 2021-09-18 LAB — PHOSPHORUS: Phosphorus: 4.2 mg/dL (ref 2.5–4.6)

## 2021-09-18 NOTE — Plan of Care (Signed)
  Problem: Education: Goal: Knowledge of General Education information will improve Description Including pain rating scale, medication(s)/side effects and non-pharmacologic comfort measures Outcome: Progressing   

## 2021-09-18 NOTE — TOC Progression Note (Addendum)
Transition of Care [facility]) - Progression Note    Patient Details  Name: Billy Cherry MRN: [id number] Date of Birth: 05/24/1970  Transition of Care [facility]) CM/SW Contact  Janae Bridgeman, RN Phone Number: 09/18/2021, 10:35 AM  Clinical Narrative:    CM called and spoke with Jeanmarie Plant, Director with [facility] and he states that he is waiting on insurance authorization through Medicaid at this time to cover the costs for private duty for the home.  Tod Persia, Director states that [employer] is unable to provide PM shifts at this time and plans on following up with [employer] for PM and weekend shift coverage.  Tod Persia, Director states that he plans to reach out to the patient's wife, Theola Sequin, this morning to get permission to send [facility] necessity letter to coordinate care needs in the home.  Tod Persia, Director with [facility] plans to follow up with CM today - for update regarding insurance authorization and patient's potential discharge to home on 09/21/2021.  09/18/2021 1500 - I called and spoke with Jeanmarie Plant, Admissions director of [facility] and Kelle Darting, [facility] and they stated that they are waiting on insurance authorization approval for private duty nursing through the patient's current Medicaid insurance coverage at this time and they state it will not not likely be completed until Monday, 09/25/21.  Updated clinicals including Attending physician progress notes, MAR, CCM notes, nutrition consult notes and recent WOC RN notes were faxed to [facility] at fax # [phone].  I called Oletha Cruel, CM with [employer] and he was updated on planned discharge date to home so that hospital bed and tube feeding pump can be delivered to the patient's home within 48 hours of discharge to the home.  CM and MSW with DTP Team will continue to follow the patient for discharge planning needs.   Expected Discharge Plan: Home w Home Health Services Barriers to Discharge: Other (must enter  comment) (Patient's wife - requested private duty services through alternative provider for 12 hours shifts for staffing at the home.)  Expected Discharge Plan and Services Expected Discharge Plan: Home w Home Health Services In-house Referral: Clinical Social Work Discharge Planning Services: CM Consult, Follow-up appt scheduled Post Acute Care Choice: Home Health Living arrangements for the past 2 months: Single Family Home Expected Discharge Date: 08/07/21               DME Arranged: Janina Mayo supplies, Tube feeding, Suction, Hospital bed, Oxygen DME Agency: [employer]       HH Arranged: RN (Private Duty Nursing through [employer] versus [place]) HH Agency: [employer] [facility] Date HH Agency Contacted: 07/04/21 Time HH Agency Contacted: 1000 Representative spoke with at [facility] Agency: Jeanmarie Plant, Director with [employer] [phone]   Social Determinants of Health (SDOH) Interventions    Readmission Risk Interventions    04/14/2021   11:14 AM  Readmission Risk Prevention Plan  Transportation Screening Complete  HRI or Home Care Consult Complete  Social Work Consult for Recovery Care Planning/Counseling Complete  Palliative Care Screening Complete  Medication Review Oceanographer) Complete

## 2021-09-18 NOTE — Progress Notes (Signed)
NAME:  Billy Cherry, MRN:  [id number], DOB:  1970-06-12, LOS: 172 ADMISSION DATE:  03/30/2021, CONSULTATION DATE:  1/19 REFERRING MD:  Evette Georges, CHIEF COMPLAINT:  Found down, cardiac arrest   Brief Pt Description / Synopsis:  51 year old male with out-of-hospital V. fib cardiac arrest in setting of respiratory arrest due to suspected angioedema from ACE inhibitor leading to asphyxiation.  Concern on his 12 lead EKG for anterolateral ST elevation so he was taken to the cath lab where his study showed no significant coronary artery disease and an LVEF < 20%  Now with anoxic brain injury.  Pertinent  Medical History  Hypertension  Significant Hospital Events: Including procedures, antibiotic start and stop dates in addition to other pertinent events   1/19 admission, left heart cath without significant CAD.  Concern for anoxic brain injury 1/20: MRI pending, Neuro following 1/21: MRI shows areas of hypoxic-ischemic injury bilaterally. Fevered; cultures obtained. 1/23 severe brain damage 1/24 evidence of severe brain damage, remains critically ill 1/25: Plan for PEG today. Neuro exam unchanged.  Does exhibit new eyelid tremor like movements and lip/jaw tremor like vs clonus movement.  Bolused Keppra and increased maintenance dose.  Repeat EEG. CTH: concerning for progression of anoxic injury.  1/26: Repeat EEG unchanged and continues to show myoclonic seizures despite addition of Valproic acid yesterday. transfer to [facility] for continuous EEG 1/28: Continues to have myoclonic sz on EEG 1/29 No acute issues overnight 2/3 no meaningful neuro recovery. Diuresed 2/5 unchanged 2/6 New fever to 102, WBC 16K - treated with vancomycin for 7d based on cultures.  2/11: percutaneous tracheostomy 2/13 >2/20 on trach collar. No vent.  2/20 Trach change to 6 cuffless 2/27 status unchanged 3/13 unchanged 3/20 due for trach exchange given continue secretions (although improved) will stick to 6 cuffless  for exchange  06/05/2021 Continued thick  secretions, scant blood tinged 4/3/am 06/06/2021 CT of the chest with new PE and haziness consistent with pneumonia 5/30 continue tube feeding/free water  Interim History / Subjective:   Patient unresponsive, trach collar in place.  Still has daily significant secretions.  Objective   Blood pressure 135/79, pulse 82, temperature 98.4 F (36.9 C), temperature source Oral, resp. rate 18, height 5\' 10"  (1.778 m), weight 97 kg, SpO2 95 %.    FiO2 (%):  [21 %] 21 %   Intake/Output Summary (Last 24 hours) at 09/18/2021 1231 Last data filed at 09/17/2021 2245 Gross per 24 hour  Intake 0 ml  Output 1100 ml  Net -1100 ml    Filed Weights   09/15/21 0500 09/16/21 0412 09/17/21 0315  Weight: 96 kg 94 kg 97 kg   Examination:  General: Young male, trach in place, not responsive Neuro: No withdrawal to pain HEENT: Not tracking Trach, CDI, secretions appear clear and thin Heart: Regular rhythm S1-S2 Lungs: Clear to auscultation bilaterally  CXR 09/04/21 Cardiomegaly. Tracheostomy in place. Mild patchy density at both lung bases could be atelectasis or mild patchy basilar pneumonia. Similar appearance to the study 2 weeks ago.  Assessment & Plan:  Out of hospital V-fib arrest Postarrest anoxic brain injury Myoclonus Respiratory failure with prolonged mechanical ventilation Tracheostomy dependent Cardiomyopathy Pulmonary embolism Hypertension  Plan: Continue routine trach care As needed suctioning Not a candidate for decannulation due to mental status Continue atropine, glycopyrrolate and scopolamine patches for 6 secretion control Continue chest PT Continue anticoagulation Overall prognosis remains poor.  Pulmonary will see weekly for trach management. Please call 11/05/21 sooner if needed.  [place]  Tonia Brooms DO [facility] 09/18/2021 12:31 PM

## 2021-09-18 NOTE — Progress Notes (Signed)
Progress Note Patient: Billy Cherry WUJ:[id number] DOB: Mar 23, 1970 DOA: 03/30/2021  DOS: the patient was seen and examined on 09/18/2021  Brief Cherry course: [name] 51 years old male with past medical history of hypertension who was brought into the Cherry after being found unresponsive by his wife.  Patient was initially admitted to Billy Cherry [facility] for out of Cherry cardiac arrest and ventricular fibrillation with respiratory arrest suspected from angioedema from ACE inhibitor leading to asphyxiation.  Patient was then subsequently transferred to Billy Cherry.  Below is the Sequence of events during hospitalization,  1/19 admitted to Billy Cherry, Left heart cath without significant CAD.  Concern for anoxic brain injury 1/21: MRI shows areas of hypoxic-ischemic injury bilaterally. Fevered; cultures obtained. 1/25: Neuro exam unchanged.  Does exhibit new eyelid tremor like movements and lip/jaw tremor like vs clonus movement.  Bolused Keppra and increased maintenance dose.  Repeat EEG. CTH: concerning for progression of anoxic injury.  1/26: Repeat EEG unchanged and continues to show myoclonic seizures despite addition of Valproic acid, Transfer to [facility] for continuous EEG 1/28: Continues to have myoclonic sz on EEG 2/6 New fever to 102, WBC 16K - treated with vancomycin for 7d based on cultures.  2/11: percutaneous tracheostomy 2/13 >2/20 on trach collar. No vent.  2/20 Trach change to 6 cuffless 2/22 PEG tube placement. 3/13: Doppler positive for left leg DVT, eliquis started 06/06/2021 CT of the chest with new PE and haziness consistent with pneumonia, patient was briefly on heparin but was placed back on Eliquis per [facility] 4/05-4/11 treated with Unasyn for aspiration pneumonia.  Continues to have low-grade temps, suspect silent/occult aspiration 08/04/21- Patient remains unresponsive.  Awaiting for placement and disposition has been difficult 6/21 > 6/27:  positive blood cultures (staph capitis/epidermidis), likely contamination. No antibiotics initiated.  Now plan is to go home with home therapy.  Social worker and case manager is currently working on the case.  Plan to discharge potentially on 7/20.  Assessment and Plan: Out-of-Cherry cardiac arrest Postarrest anoxic encephalopathy Postarrest respiratory failure s/p tracheostomy S/P PEG tube placement Suspected reason for cardiac arrest possible angioedema from ACE inhibitor Left heart cath at [facility] shows no significant coronary artery disease 2D echocardiogram showed reduced LV function at <20% with global hypokinesis.   Now status post trach and PEG.   Remains with anoxic encephalopathy. Continue Keppra - poor prognosis overall; professional recommendation is transition to hospice/comfort care   Anoxic encephalopathy ([name]) MRI brain significant for hypoxic brain injury.  Nonpurposeful eye movements and unable to interact in any meaningful capacity. Poor neurologic status has remained unchanged - TENTATIVE dispo plan being pursued for patient to go home with [facility] and care of his wife   Acute respiratory failure with hypoxia S/P tracheostomy status Status post trach collar. Now chronic trach Not Nancey Kreitz candidate for decannulation. -Continue treatment for secretions with atropine, scopolamine, and Robinul - at risk for recurrent infections   Recurrent pneumonia Previously had completed vancomycin for MSSA and [name] in early march.   Received more than 7-day course of IV Fortaz.  In April completed Nikoli Nasser course of IV Unasyn for aspiration pneumonia. Chest x-ray on 6/11 concerning for possible pneumonia. Patient retreated for pneumonia with Cefepime x7 days.  Aggressive pulmonary toileting. at risk for recurrent infections.   Excessive respiratory secretions. Currently being managed with frequent suctioning along with scopolamine patch scheduled Robinul and  atropine drops.  Staph capitis in blood-contamination Blood cultures (6/20) are positive  for staphylococcus capitis. Repeat culture on 6/21 grew Staph epidermidis. Prior provider discussed with ID.  Given back-to-back cultures growing different species of staphylococcus, it was felt that this is mostly contaminant.  Currently no clinical concern for ongoing active infection.   DVT (deep venous thrombosis) and PE Duplex ultrasound of lower extremity on 05/15/2021 showed age indeterminate deep vein thrombosis involving the left femoral vein, left proximal profunda vein, left posterior tibial veins, left popliteal vein, and left peroneal veins. CT scan in April also showed possibility of pulmonary embolism. -Continue Eliquis  Dysphagia Patient was on bolus feeds due to concerns with recurrent aspiration currently on continuous feed since 6/30. -Aspiration precautions -Continue tube feeds   Pressure injury of skin Lateral penis, posterior right heel, right foot. Not present on admission. Continue dressing changes.  HTN (hypertension) Blood pressure acceptable, follow  -Continue current regimen.   HFrEF (heart failure with reduced ejection fraction) ([name]) 2D echocardiogram-LVEF < 20%,  -Continue coreg, imdur, spironolactone -Not Eleonora Peeler candidate for ACE I/ARB/ARN I given life-threatening angioedema -Consider SGLT2 inhibitor as well as repeating echocardiogram (EF 30-35%, small pericardial effusion) will discuss additional meds with wife prior to starting -Currently no indication for Lasix as volume level appears adequate.  Although has been treated in the past with Lasix as needed.  Will discharge with as needed Lasix.   Intermittent myoclonic seizures Underwent multiple EEG as well as long-term EEG monitoring. Neurology was consulted regarding. Seen by neurology.  Signed off on 1/30 Per neuro notes, " Patient has suffered significant neurologic injury with minimal to no chances of  meaningful brain recovery. treating those would not improve patient's outcome.  Therefore, I would recommend continuing current AEDs for now.  If patient has increased frequency of myoclonic seizures, Klonopin can be increased"   Class I obesity Body mass index is 30.68 kg/m.  Placing the pt at higher risk of poor outcomes.  Goals of care conversation. Palliative care was consulted and signed off on 2/13.  Family continues to pursue full scope of intervention and medical care.  Subjective: nonverbal  Physical Exam: Vitals:   09/18/21 1139 09/18/21 1205 09/18/21 1512 09/18/21 1630  BP:  135/79  122/88  Pulse: 81 82 85 82  Resp: 18 18 18 18   Temp:  98.4 F (36.9 C)  98.6 F (37 C)  TempSrc:  Oral  Oral  SpO2: 95% 95% 97% 95%  Weight:      Height:       General: No acute distress. Cardiovascular: RRR Lungs:trach, unlabored Abdomen: PEG Neurological: unresponsive, does  not follow commands Extremities: No clubbing or cyanosis. No edema.  Family Communication: Dr. discussed with wife 7/14  Disposition: Status is: Inpatient Remains inpatient appropriate because: Awaiting safe discharge plan.  Author: 8/14, MD 09/18/2021 6:02 PM  Please look on [email or web address] to find out who is on call.

## 2021-09-19 MED ORDER — ERYTHROMYCIN 5 MG/GM OP OINT
1.0000 | TOPICAL_OINTMENT | Freq: Four times a day (QID) | OPHTHALMIC | Status: AC
  Administered 2021-09-19 – 2021-09-26 (×27): 1 via OPHTHALMIC
  Filled 2021-09-19: qty 3.5

## 2021-09-19 NOTE — Progress Notes (Signed)
Progress Note Patient: Billy Cherry DGU:[id number] DOB: 1970-06-21 DOA: 03/30/2021  DOS: the patient was seen and examined on 09/19/2021  Brief hospital course: [name] 51 years old male with past medical history of hypertension who was brought into the hospital after being found unresponsive by his wife.  Patient was initially admitted to [facility] hospital for out of hospital cardiac arrest and ventricular fibrillation with respiratory arrest suspected from angioedema from ACE inhibitor leading to asphyxiation.  Patient was then subsequently transferred to [facility].  Below is the Sequence of events during hospitalization,  1/19 admitted to [facility] ICU, Left heart cath without significant CAD.  Concern for anoxic brain injury 1/21: MRI shows areas of hypoxic-ischemic injury bilaterally. Fevered; cultures obtained. 1/25: Neuro exam unchanged.  Does exhibit new eyelid tremor like movements and lip/jaw tremor like vs clonus movement.  Bolused Keppra and increased maintenance dose.  Repeat EEG. CTH: concerning for progression of anoxic injury.  1/26: Repeat EEG unchanged and continues to show myoclonic seizures despite addition of Valproic acid, Transfer to [facility] from [place] for continuous EEG 1/28: Continues to have myoclonic sz on EEG 2/6 New fever to 102, WBC 16K - treated with vancomycin for 7d based on cultures.  2/11: percutaneous tracheostomy 2/13 >2/20 on trach collar. No vent.  2/20 Trach change to 6 cuffless 2/22 PEG tube placement. 3/13: Doppler positive for left leg DVT, eliquis started 06/06/2021 CT of the chest with new PE and haziness consistent with pneumonia, patient was briefly on heparin but was placed back on Eliquis per [facility] 4/05-4/11 treated with Unasyn for aspiration pneumonia.  Continues to have low-grade temps, suspect silent/occult aspiration 08/04/21- Patient remains unresponsive.  Awaiting for placement and disposition has been difficult 6/21 > 6/27:  positive blood cultures (staph capitis/epidermidis), likely contamination. No antibiotics initiated.  Now plan is to go home with home therapy.  Social worker and case manager is currently working on the case.  Plan to discharge potentially on 7/20.  Assessment and Plan: Out-of-hospital cardiac arrest Postarrest anoxic encephalopathy Postarrest respiratory failure s/p tracheostomy S/P PEG tube placement Suspected reason for cardiac arrest possible angioedema from ACE inhibitor Left heart cath at [facility] shows no significant coronary artery disease 2D echocardiogram showed reduced LV function at <20% with global hypokinesis.   Now status post trach and PEG.   Remains with anoxic encephalopathy. Continue Keppra - poor prognosis overall; professional recommendation is transition to hospice/comfort care   Anoxic encephalopathy ([name]) MRI brain significant for hypoxic brain injury.  Nonpurposeful eye movements and unable to interact in any meaningful capacity. Poor neurologic status has remained unchanged - TENTATIVE dispo plan being pursued for patient to go home with [facility] and care of his wife   Acute respiratory failure with hypoxia S/P tracheostomy status Status post trach collar. Now chronic trach Not Donell Tomkins candidate for decannulation. -Continue treatment for secretions with atropine, scopolamine, and Robinul - at risk for recurrent infections   Recurrent pneumonia Previously had completed vancomycin for MSSA and [name] in early march.   Received more than 7-day course of IV Fortaz.  In April completed Challen Spainhour course of IV Unasyn for aspiration pneumonia. Chest x-ray on 6/11 concerning for possible pneumonia. Patient retreated for pneumonia with Cefepime x7 days.  Aggressive pulmonary toileting. at risk for recurrent infections.   Excessive respiratory secretions. Currently being managed with frequent suctioning along with scopolamine patch scheduled Robinul and  atropine drops.  Staph capitis in blood-contamination Blood cultures (6/20) are positive  for staphylococcus capitis. Repeat culture on 6/21 grew Staph epidermidis. Prior provider discussed with ID.  Given back-to-back cultures growing different species of staphylococcus, it was felt that this is mostly contaminant.  Currently no clinical concern for ongoing active infection.   DVT (deep venous thrombosis) and PE Duplex ultrasound of lower extremity on 05/15/2021 showed age indeterminate deep vein thrombosis involving the left femoral vein, left proximal profunda vein, left posterior tibial veins, left popliteal vein, and left peroneal veins. CT scan in April also showed possibility of pulmonary embolism. -Continue Eliquis  Dysphagia Patient was on bolus feeds due to concerns with recurrent aspiration currently on continuous feed since 6/30. -Aspiration precautions -Continue tube feeds   Pressure injury of skin Lateral penis, posterior right heel, right foot. Not present on admission. Continue dressing changes.  HTN (hypertension) Blood pressure acceptable, follow  -Continue current regimen.   HFrEF (heart failure with reduced ejection fraction) ([name]) 2D echocardiogram-LVEF < 20%,  -Continue coreg, imdur, spironolactone -Not Hermione Havlicek candidate for ACE I/ARB/ARN I given life-threatening angioedema -repeat echocardiogram (EF 30-35%, small pericardial effusion) -> previous provider considered question of adding SGLT2 inhibitor, spoke to wife briefly about this, but given his overall prognosis, I don't think adding it is likely to change overall prognosis -Currently no indication for Lasix as volume level appears adequate.  Although has been treated in the past with Lasix as needed.  Will discharge with as needed Lasix.   Intermittent myoclonic seizures Underwent multiple EEG as well as long-term EEG monitoring. Neurology was consulted regarding. Seen by neurology.  Signed off on 1/30 Per  neuro notes, " Patient has suffered significant neurologic injury with minimal to no chances of meaningful brain recovery. treating those would not improve patient's outcome.  Therefore, I would recommend continuing current AEDs for now.  If patient has increased frequency of myoclonic seizures, Klonopin can be increased"   Conjunctivitis Erythromycin drops  Class I obesity Body mass index is 30.68 kg/m.  Placing the pt at higher risk of poor outcomes.  Goals of care conversation. Palliative care was consulted and signed off on 2/13.  Family continues to pursue full scope of intervention and medical care.  Subjective: nonverbal  Physical Exam: Vitals:   09/19/21 0812 09/19/21 1142 09/19/21 1426 09/19/21 1512  BP:   (!) 134/104   Pulse: 83 96 90 87  Resp: 17 16 17 18   Temp:   98.5 F (36.9 C)   TempSrc:   Axillary   SpO2: 99% 99% 98% 100%  Weight:      Height:       General: No acute distress. L eye conjunctivitis, purulent drainage Cardiovascular: RRR Lungs: trach Abdomen: peg Neurological: unresponsive, not following commands Extremities: No clubbing or cyanosis. No edema.  Family Communication: wife7/18  Disposition: Status is: Inpatient Remains inpatient appropriate because: Awaiting safe discharge plan.  Author: , MD 09/19/2021 8:01 PM  Please look on [email or web address] to find out who is on call.

## 2021-09-19 NOTE — TOC Progression Note (Addendum)
Transition of Care [facility]) - Progression Note    Patient Details  Name: Billy Cherry MRN: [id number] Date of Birth: Mar 11, 1970  Transition [facility]) CM/SW Contact  Janae Bridgeman, RN Phone Number: 09/19/2021, 7:56 AM  Clinical Narrative:    CM called and spoke with Verdell Face, CM for [facility] - [phone] and she is planning to coordinate some night and weekend coverage for the patient's wife for private duty staffing at the home.  The attending physician, Dr. Lacretia Nicks was provided and documents for admission to be signed later today - for plans for discharge to the home - likely Monday, September 25, 2021 - pending insurance authorization approval by [employer].  Updated clinicals were provided to [facility] yesterday afternoon to assist with authorization approval.  [employer] was made aware yesterday that pending discharge date was changed to Monday, July 24 per [facility] and air mattress and tube feeding pump will need to be delivered over the weekend with the pending discharge.  I called and left a voicemail message with the patient's wife, [name] to update her on pending staffing for the home and likely discharge to the home next week once insurance authorization has been completed through [facility] and the patient's existing Medicaid coverage for home private duty nursing.  09/19/21 1332 - CM called and spoke with Verdell Face, CM with [facility] Private Duty staffing and she will likely complete documentation for co-partnership private duty staffing by tomorrow and will reach out to CM to have attending physician sign.  Black, CM states that she plans to provide PM and Weekend private duty staffing but at this time has left a message with the patient's wife to arrange meeting time to meet the RN/LPN at the home for private duty.  [facility] plans to provide 44 hours during the weekdays while the patient's wife is at work and this staffing is in place at this time  for when the patient is discharged home - only waiting on insurance authorization and discharge to home likely next week per [facility].  09/19/2021 - Received Medical Necessity Letter from Verdell Face, CM at [facility] and the signed document was resent to her so that she can obtain PM and Weekend RN/LPN staffing for the home.  CM and MSW with DTP Team will continue to follow the patient for discharge planning needs for home - September 25, 2021.   Expected Discharge Plan: Home w Home Health Services Barriers to Discharge: Other (must enter comment) (Patient's wife - requested private duty services through alternative provider for 12 hours shifts for staffing at the home.)  Expected Discharge Plan and Services Expected Discharge Plan: Home w Home Health Services In-house Referral: Clinical Social Work Discharge Planning Services: CM Consult, Follow-up appt scheduled Post Acute Care Choice: Home Health Living arrangements for the past 2 months: Single Family Home Expected Discharge Date: 08/07/21               DME Arranged: Janina Mayo supplies, Tube feeding, Suction, Hospital bed, Oxygen DME Agency: [facility]       HH Arranged: RN (Private Duty Nursing through [employer] versus [facility]) HH Agency: [employer] [facility] Date HH Agency Contacted: 07/04/21 Time HH Agency Contacted: 1000 Representative spoke with at [facility] Agency: Jeanmarie Plant, Director with [employer] - [phone]   Social Determinants of Health (SDOH) Interventions    Readmission Risk Interventions    04/14/2021   11:14 AM  Readmission Risk Prevention Plan  Transportation Screening Complete  HRI or Home Care Consult Complete  Social Work Consult for Recovery Care Planning/Counseling Complete  Palliative Care Screening Complete  Medication Review [employer]) Complete

## 2021-09-20 NOTE — Progress Notes (Cosign Needed)
Durable Medical Equipment  (From admission, onward)           Start     Ordered   09/20/21 1310  For home use only DME Tube feeding  Once       Comments: Transition to continuous TF via PEG:    Osmolite 1.5 @ 60 ml/hr (1440 ml/day) 45 ml ProSource TF BID   Provides: 2240 kcal, 112 grams protein, 1094 ml free water.    200 ml free water every 6 hours Total free water: 1894   D/C : nutrisource fiber and Juven     09/20/21 1315   09/20/21 1252  For home use only DME Trach supplies  (For Home Use Only DME Trach Supplies)  Once       Question Answer Comment  Trach Type Shiley   Back Up Trach Type Shiley   Cuffed or Uncuffed Uncuffed   Fenestrated No   Size 6 mm   XLT Length No   Trach Supplies/Equipment for Home Use Tube Holder/Collar   Trach Supplies/Equipment for Home Use Tracheostomy Drain Dressings   Trach Supplies/Equipment for Home Use Medical Suction Machine   Trach Supplies/Equipment for Home Use Suction Catheters   Trach Supplies/Equipment for Home Use Tracheostomy Care Cleaning Kits   Trach Supplies/Equipment for Home Use HME Device   Trach Supplies/Equipment for Home Use Humidification (oxygen 5 liters via trach collar to provide specified % - if tracheostomy is capped with occlusive cap during day, a separate [name] order will need to be provided)   Suction Catheter Size 14 French (for size 6 or higher)   Cleaning Kits 60   Oxygen % Other (see comment) 5 L/min     09/20/21 1252   09/20/21 1250  For home use only DME Tube feeding pump  Once       Question:  Length of Need  Answer:  Lifetime   09/20/21 1249   09/20/21 1250  For home use only DME oxygen  Once       Question Answer Comment  Length of Need 12 Months   Mode or (Route) Mask   Liters per Minute 5   Frequency Continuous (stationary and portable oxygen unit needed)   Oxygen conserving device Yes   Oxygen delivery system Gas      09/20/21 1250   09/20/21 1249  For home use only DME Other see comment   Once       Comments: Please deliver APP Mattress chuck pads, diaper wipes, open briefs XL size  Question:  Length of Need  Answer:  Lifetime   09/20/21 1249   09/20/21 1248  For home use only DME Hospital bed  Once       Question Answer Comment  Length of Need Lifetime   The above medical condition requires: Patient requires the ability to reposition frequently   Head must be elevated greater than: 30 degrees   Bed type Semi-electric   Hoyer Lift Yes   Support Surface: Alternating Pressure Pad and Pump      09/20/21 1247   09/15/21 1437  For home use only DME Tube feeding pump  Once       Question:  Length of Need  Answer:  Lifetime   09/15/21 1436   07/05/21 1050  For home use only DME Hospital bed  Once       Comments: Please call wife after 6 pm to coordinate delivery.  Question Answer Comment  Length of Need Lifetime  Patient has (list medical condition): anoxic brain injury, cardiae arrest   The above medical condition requires: Patient requires the ability to reposition frequently   Head must be elevated greater than: 30 degrees   Bed type Semi-electric   Hoyer Lift Yes   Support Surface: Alternating Pressure Pad and Pump      07/05/21 1050   07/03/21 1027  For home use only DME Other see comment  Once       Comments: APP mattress  Chuck pads, Diaper wipes, open Briefs XL  Question:  Length of Need  Answer:  Lifetime   07/03/21 1027   06/27/21 1648  For home use only DME oxygen  Once       Question Answer Comment  Length of Need 12 Months   Mode or (Route) Nasal cannula   Liters per Minute 5   Frequency Continuous (stationary and portable oxygen unit needed)   Oxygen conserving device Yes   Oxygen delivery system Gas      06/27/21 1647

## 2021-09-20 NOTE — Progress Notes (Deleted)
Durable Medical Equipment  (From admission, onward)           Start     Ordered   09/20/21 1252  For home use only DME Trach supplies  (For Home Use Only DME [employer])  Once       Question Answer Comment  Trach Type Shiley   Back Up Trach Type Shiley   Cuffed or Uncuffed Uncuffed   Fenestrated No   Size 6 mm   XLT Length No   Trach Supplies/Equipment for Home Use Tube Holder/Collar   Trach Supplies/Equipment for Home Use Tracheostomy Drain Dressings   Trach Supplies/Equipment for Home Use Medical Suction Machine   Trach Supplies/Equipment for Home Use Suction Catheters   Trach Supplies/Equipment for Home Use Tracheostomy Care Cleaning Kits   Trach Supplies/Equipment for Home Use HME Device   Trach Supplies/Equipment for Home Use Humidification (oxygen 5 liters via trach collar to provide specified % - if tracheostomy is capped with occlusive cap during day, a separate [place] order will need to be provided)   Suction Catheter Size 14 French (for size 6 or higher)   Cleaning Kits 60   Oxygen % Other (see comment) 5 L/min     09/20/21 1252   09/20/21 1250  For home use only DME Tube feeding pump  Once       Question:  Length of Need  Answer:  Lifetime   09/20/21 1249   09/20/21 1250  For home use only DME oxygen  Once       Question Answer Comment  Length of Need 12 Months   Mode or (Route) Mask   Liters per Minute 5   Frequency Continuous (stationary and portable oxygen unit needed)   Oxygen conserving device Yes   Oxygen delivery system Gas      09/20/21 1250   09/20/21 1249  For home use only DME Other see comment  Once       Comments: Please deliver APP Mattress chuck pads, diaper wipes, open briefs XL size  Question:  Length of Need  Answer:  Lifetime   09/20/21 1249   09/20/21 1248  For home use only DME Hospital bed  Once       Question Answer Comment  Length of Need Lifetime   The above medical condition requires: Patient requires the ability to reposition  frequently   Head must be elevated greater than: 30 degrees   Bed type Semi-electric   Hoyer Lift Yes   Support Surface: Alternating Pressure Pad and Pump      09/20/21 1247   09/15/21 1437  For home use only DME Tube feeding pump  Once       Question:  Length of Need  Answer:  Lifetime   09/15/21 1436   07/05/21 1050  For home use only DME Hospital bed  Once       Comments: Please call wife after 6 pm to coordinate delivery.  Question Answer Comment  Length of Need Lifetime   Patient has (list medical condition): anoxic brain injury, cardiae arrest   The above medical condition requires: Patient requires the ability to reposition frequently   Head must be elevated greater than: 30 degrees   Bed type Semi-electric   Hoyer Lift Yes   Support Surface: Alternating Pressure Pad and Pump      07/05/21 1050   07/03/21 1027  For home use only DME Other see comment  Once  Comments: APP mattress  Chuck pads, Diaper wipes, open Briefs XL  Question:  Length of Need  Answer:  Lifetime   07/03/21 1027   06/27/21 1648  For home use only DME oxygen  Once       Question Answer Comment  Length of Need 12 Months   Mode or (Route) Nasal cannula   Liters per Minute 5   Frequency Continuous (stationary and portable oxygen unit needed)   Oxygen conserving device Yes   Oxygen delivery system Gas      06/27/21 1647

## 2021-09-20 NOTE — Progress Notes (Signed)
PROGRESS NOTE    Billy Cherry  UXN:[id number] DOB: 1970/05/10 DOA: 03/30/2021 PCP: Patient, No Pcp Per   Brief Narrative: Patient is a 51 years old male with past medical history of hypertension who was brought into the hospital after being found unresponsive by his wife.  Patient was initially admitted to [facility] hospital for out of hospital cardiac arrest and ventricular fibrillation with respiratory arrest suspected from angioedema from ACE inhibitor leading to asphyxiation.  Patient was then subsequently transferred to [facility].  Below is the Sequence of events during hospitalization,  1/19 admitted to [facility] ICU, Left heart cath without significant CAD.  Concern for anoxic brain injury 1/21: MRI shows areas of hypoxic-ischemic injury bilaterally. Fevered; cultures obtained. 1/25: Neuro exam unchanged.  Does exhibit new eyelid tremor like movements and lip/jaw tremor like vs clonus movement.  Bolused Keppra and increased maintenance dose.  Repeat EEG. CTH: concerning for progression of anoxic injury.  1/26: Repeat EEG unchanged and continues to show myoclonic seizures despite addition of Valproic acid, Transfer to [facility] from [facility] for continuous EEG 1/28: Continues to have myoclonic sz on EEG 2/6 New fever to 102, WBC 16K - treated with vancomycin for 7d based on cultures.  2/11: percutaneous tracheostomy 2/13 >2/20 on trach collar. No vent.  2/20 Trach change to 6 cuffless 2/22 PEG tube placement. 3/13: Doppler positive for left leg DVT, eliquis started 06/06/2021 CT of the chest with new PE and haziness consistent with pneumonia, patient was briefly on heparin but was placed back on Eliquis per [facility] 4/05-4/11 treated with Unasyn for aspiration pneumonia.  Continues to have low-grade temps, suspect silent/occult aspiration 08/04/21- Patient remains unresponsive.  Awaiting for placement and disposition has been difficult 6/21 > 6/27: positive blood cultures (staph  capitis/epidermidis), likely contamination. No antibiotics initiated.  Now plan is to go home with home therapy.  Social worker and case manager is currently working on the case.  Plan to discharge potentially on 7/24.   Assessment and Plan:  S/p Cardiac arrest Postarrest anoxic encephalopathy Postarrest respiratory failure s/p tracheostomy S/P PEG tube placement 2D echocardiogram showed reduced LV function at <20% with global hypokinesis.  Now status post trach and PEG.  Remains obtunded with anoxic encephalopathy. -Continue Keppra  Anoxic encephalopathy ([name]) MRI brain significant for hypoxic brain injury. Continue supportive care. Patient currently keeps his eyes open, but does not answer questions or follow commands. Unchanged neurologic status   Acute respiratory failure with hypoxia Status post trach collar. Continue supportive care. Now chronic.   RLL pneumonia Previously had completed vancomycin for MSSA and [name] in early march.  Received more than 7-day course of IV Fortaz. On 4/3, CT chest was ordered that indicated an area of alveolar infiltration that may represent pneumonia.  Tracheal aspirate has been sent for culture and is significant for normal respiratory flora. Concerning for aspiration. Completed course of Zosyn. Patient with worsening secretions. Chest x-ray on 6/11 concerning for possible pneumonia. Patient retreated for pneumonia with Cefepime x7 days. Patient has developed worsening secretions. CT chest negative for recurrent pneumonia.  Positive blood cultures Tmax of 100 F on 6/19. No clear source, although patient has had multiple episodes of aspiration pneumonia. Blood cultures (6/20) obtained and are positive for staphylococcus capitis. Repeat blood cultures (6/20) preliminarily positive for GPC in clusters. Discussed with ID. Repeat cultures with different species of staphylococcus. Likely contaminant. Antibiotics held.   DVT (deep venous  thrombosis) ([name]) Duplex ultrasound of lower extremity on  05/15/2021 showed age indeterminate deep vein thrombosis involving the left femoral vein, left proximal profunda vein, left posterior tibial veins, left popliteal vein, and left peroneal veins. -Continue Eliquis   Dysphagia On bolus feeds per dietitian -Aspiration precautions -Continue tube feeds  Emesis Unsure of etiology. Tube feeds held and eventually resumed. Abdominal x-ray without clear etiology. Obtained CT abdomen/pelvis which did not identify an etiology for symptoms. Patient was transitioned from bolus tube feeds to continuous. -Zofran PRN   Pulmonary embolus ([name]) Noted to have possible acute on chronic pulmonary embolism on CT.  Also have some evidence of possible pulmonary hypertension.  Patient was previously managed on Eliquis which is transitioned to Lovenox -Continue Eliquis   Pressure injury of skin Lateral penis, posterior right heel, right foot. Not present on admission.   HTN (hypertension) Blood pressure continues to be elevated. -Continue Coreg, Cardura, isosorbide dinitrate and hydralazine -PRN hydralazine   HFrEF (heart failure with reduced ejection fraction) ([name]) 2D echocardiogram-LVEF < 20%,  -Continue coreg, imdur, spironolactone   Myoclonus Seen by neurology. Per neuro notes, suppressing his myoclonus will not ultimately change the prognosis. -Continue Keppra, Klonopin  Peripheral edema Possibly secondary to amount of fluid patient is receiving via PEG tube for feeds and medicine. Weight stable.   Hypernatremia-resolved Continue free water flushes   DVT prophylaxis: Lovenox Code Status:   Code Status: Full Code Family Communication: None at bedside Disposition Plan: Discharge home pending insurance, home health/services availability vs SNF. TOC working on possible home care and private duty. No SNF offers at this time.   Consultants:  Infectious disease Palliative  care Neurology Cardiology Critical care  Procedures:  Tracheostomy PEG tube  Antimicrobials: Unasyn Cefepime Ceftazidime Vancomycin Cefazolin   Subjective: Non verbal  Objective: BP (!) 137/94 (BP Location: Right Arm)   Pulse 89   Temp 98.6 F (37 C) (Oral)   Resp 18   Ht 5\' 10"  (1.778 m)   Wt 97 kg   SpO2 100%   BMI 30.68 kg/m   Examination:  General exam: Appears calm and comfortable Respiratory system: Clear to auscultation. Respiratory effort normal. Cardiovascular system: S1 & S2 heard, RRR. No murmurs Gastrointestinal system: Abdomen is nondistended, soft and non-tender. Normal bowel sounds heard. Central nervous system: Alert.   Data Reviewed: I have personally reviewed following labs and imaging studies  CBC Lab Results  Component Value Date   WBC 6.2 09/18/2021   RBC 4.38 09/18/2021   HGB 13.2 09/18/2021   HCT 40.4 09/18/2021   MCV 92.2 09/18/2021   MCH 30.1 09/18/2021   PLT 195 09/18/2021   MCHC 32.7 09/18/2021   RDW 15.6 (H) 09/18/2021   LYMPHSABS 1.6 09/18/2021   MONOABS 1.1 (H) 09/18/2021   EOSABS 0.1 09/18/2021   BASOSABS 0.0 09/18/2021     Last metabolic panel Lab Results  Component Value Date   NA 136 09/18/2021   K 4.5 09/18/2021   CL 97 (L) 09/18/2021   CO2 27 09/18/2021   BUN 26 (H) 09/18/2021   CREATININE 0.79 09/18/2021   GLUCOSE 113 (H) 09/18/2021   GFRNONAA >60 09/18/2021   CALCIUM 9.3 09/18/2021   PHOS 4.2 09/18/2021   PROT 7.0 09/18/2021   ALBUMIN 2.5 (L) 09/18/2021   BILITOT 0.3 09/18/2021   ALKPHOS 65 09/18/2021   AST 25 09/18/2021   ALT 30 09/18/2021   ANIONGAP 12 09/18/2021    GFR: Estimated Creatinine Clearance: 129.1 mL/min (by C-G formula based on SCr of 0.79 mg/dL).   Radiology Studies: No  results found.    LOS: 174 days    Jacquelin Hawking, MD [facility] 09/20/2021, 10:47 AM   If 7PM-7AM, please contact night-coverage [email or web address]

## 2021-09-20 NOTE — TOC Progression Note (Addendum)
Transition of Care [facility]) - Progression Note    Patient Details  Name: Billy Cherry MRN: [id number] Date of Birth: [birth year]  Transition of Care [facility]) CM/SW Contact  Janae Bridgeman, RN Phone Number: 09/20/2021, 12:54 PM  Clinical Narrative:    Jenness Corner, CM with [employer] called and requested that renewed dme orders be placed in Epic along with a new DME note for reimbursement through the patient's insurance providers.  New orders were placed and I sent the attending physician, Dr. Caleb Popp, a message to co-sign.  New DME orders include the patient's current tube feeding orders along with request for Tube feeding pump - per lasted nutrition note.  [facility] states that insurance authorization should be completed by Monday, 09/25/2021 and Private duty staffing is in place for the home for next week.  I asked Oletha Cruel, CM at [employer] to coordinate delivery of the APP mattress and tube feeding pump to the patient's home this weekend since the patient should discharge home next week - likely plan for Monday, 09/25/21.  MSW with DTP Team will continue to follow the patient for discharge to the home next week.   Expected Discharge Plan: Home w Home Health Services Barriers to Discharge: Other (must enter comment) (Patient's wife - requested private duty services through alternative provider for 12 hours shifts for staffing at the home.)  Expected Discharge Plan and Services Expected Discharge Plan: Home w Home Health Services In-house Referral: Clinical Social Work Discharge Planning Services: CM Consult, Follow-up appt scheduled Post Acute Care Choice: Home Health Living arrangements for the past 2 months: Single Family Home Expected Discharge Date: 08/07/21               DME Arranged: Janina Mayo supplies, Tube feeding, Suction, Hospital bed, Oxygen DME Agency: [employer]       HH Arranged: RN (Private Duty Nursing through [employer] versus [employer]) HH Agency: [employer] Date HH Agency Contacted: 07/04/21 Time HH Agency Contacted: 1000 Representative spoke with at [facility] Agency: Jeanmarie Plant, Director with [employer] [phone]   Social Determinants of Health (SDOH) Interventions    Readmission Risk Interventions    04/14/2021   11:14 AM  Readmission Risk Prevention Plan  Transportation Screening Complete  HRI or Home Care Consult Complete  Social Work Consult for Recovery Care Planning/Counseling Complete  Palliative Care Screening Complete  Medication Review Oceanographer) Complete

## 2021-09-21 ENCOUNTER — Other Ambulatory Visit (HOSPITAL_COMMUNITY): Payer: Self-pay

## 2021-09-21 NOTE — Progress Notes (Signed)
PROGRESS NOTE    Billy Cherry  UXN:[id number] DOB: 1970/05/10 DOA: 03/30/2021 PCP: Patient, No Pcp Per   Brief Narrative: Patient is a 51 years old male with past medical history of hypertension who was brought into the hospital after being found unresponsive by his wife.  Patient was initially admitted to [facility] hospital for out of hospital cardiac arrest and ventricular fibrillation with respiratory arrest suspected from angioedema from ACE inhibitor leading to asphyxiation.  Patient was then subsequently transferred to [facility].  Below is the Sequence of events during hospitalization,  1/19 admitted to [facility] ICU, Left heart cath without significant CAD.  Concern for anoxic brain injury 1/21: MRI shows areas of hypoxic-ischemic injury bilaterally. Fevered; cultures obtained. 1/25: Neuro exam unchanged.  Does exhibit new eyelid tremor like movements and lip/jaw tremor like vs clonus movement.  Bolused Keppra and increased maintenance dose.  Repeat EEG. CTH: concerning for progression of anoxic injury.  1/26: Repeat EEG unchanged and continues to show myoclonic seizures despite addition of Valproic acid, Transfer to [facility] from [facility] for continuous EEG 1/28: Continues to have myoclonic sz on EEG 2/6 New fever to 102, WBC 16K - treated with vancomycin for 7d based on cultures.  2/11: percutaneous tracheostomy 2/13 >2/20 on trach collar. No vent.  2/20 Trach change to 6 cuffless 2/22 PEG tube placement. 3/13: Doppler positive for left leg DVT, eliquis started 06/06/2021 CT of the chest with new PE and haziness consistent with pneumonia, patient was briefly on heparin but was placed back on Eliquis per [facility] 4/05-4/11 treated with Unasyn for aspiration pneumonia.  Continues to have low-grade temps, suspect silent/occult aspiration 08/04/21- Patient remains unresponsive.  Awaiting for placement and disposition has been difficult 6/21 > 6/27: positive blood cultures (staph  capitis/epidermidis), likely contamination. No antibiotics initiated.  Now plan is to go home with home therapy.  Social worker and case manager is currently working on the case.  Plan to discharge potentially on 7/24.   Assessment and Plan:  S/p Cardiac arrest Postarrest anoxic encephalopathy Postarrest respiratory failure s/p tracheostomy S/P PEG tube placement 2D echocardiogram showed reduced LV function at <20% with global hypokinesis.  Now status post trach and PEG.  Remains obtunded with anoxic encephalopathy. -Continue Keppra  Anoxic encephalopathy ([name]) MRI brain significant for hypoxic brain injury. Continue supportive care. Patient currently keeps his eyes open, but does not answer questions or follow commands. Unchanged neurologic status   Acute respiratory failure with hypoxia Status post trach collar. Continue supportive care. Now chronic.   RLL pneumonia Previously had completed vancomycin for MSSA and [name] in early march.  Received more than 7-day course of IV Fortaz. On 4/3, CT chest was ordered that indicated an area of alveolar infiltration that may represent pneumonia.  Tracheal aspirate has been sent for culture and is significant for normal respiratory flora. Concerning for aspiration. Completed course of Zosyn. Patient with worsening secretions. Chest x-ray on 6/11 concerning for possible pneumonia. Patient retreated for pneumonia with Cefepime x7 days. Patient has developed worsening secretions. CT chest negative for recurrent pneumonia.  Positive blood cultures Tmax of 100 F on 6/19. No clear source, although patient has had multiple episodes of aspiration pneumonia. Blood cultures (6/20) obtained and are positive for staphylococcus capitis. Repeat blood cultures (6/20) preliminarily positive for GPC in clusters. Discussed with ID. Repeat cultures with different species of staphylococcus. Likely contaminant. Antibiotics held.   DVT (deep venous  thrombosis) ([name]) Duplex ultrasound of lower extremity on  05/15/2021 showed age indeterminate deep vein thrombosis involving the left femoral vein, left proximal profunda vein, left posterior tibial veins, left popliteal vein, and left peroneal veins. -Continue Eliquis   Dysphagia On bolus feeds per dietitian initially and transitioned to continuous feeds -Aspiration precautions -Continue tube feeds  Emesis Unsure of etiology. Tube feeds held and eventually resumed. Abdominal x-ray without clear etiology. Obtained CT abdomen/pelvis which did not identify an etiology for symptoms. Patient was transitioned from bolus tube feeds to continuous. -Zofran PRN   Pulmonary embolus ([name]) Noted to have possible acute on chronic pulmonary embolism on CT.  Also have some evidence of possible pulmonary hypertension.  Patient was previously managed on Eliquis which is transitioned to Lovenox -Continue Eliquis   Pressure injury of skin Lateral penis, posterior right heel, right foot. Not present on admission.   HTN (hypertension) Blood pressure continues to be elevated. -Continue Coreg, Cardura, isosorbide dinitrate and hydralazine -PRN hydralazine   HFrEF (heart failure with reduced ejection fraction) ([name]) 2D echocardiogram-LVEF < 20%,  -Continue coreg, imdur, spironolactone   Myoclonus Seen by neurology. Per neuro notes, suppressing his myoclonus will not ultimately change the prognosis. -Continue Keppra, Klonopin  Peripheral edema Possibly secondary to amount of fluid patient is receiving via PEG tube for feeds and medicine. Weight stable.   Hypernatremia-resolved Continue free water flushes   DVT prophylaxis: Lovenox Code Status:   Code Status: Full Code Family Communication: None at bedside Disposition Plan: Discharge home with home health services on 7/24   Consultants:  Infectious disease Palliative care Neurology Cardiology Critical care  Procedures:  Tracheostomy PEG  tube  Antimicrobials: Unasyn Cefepime Ceftazidime Vancomycin Cefazolin   Subjective: Non verbal  Objective: BP 134/88 (BP Location: Right Arm)   Pulse 88   Temp 98.3 F (36.8 C) (Oral)   Resp 18   Ht 5\' 10"  (1.778 m)   Wt 97 kg   SpO2 95%   BMI 30.68 kg/m   Examination:  General exam: Appears calm and comfortable. Resting in bed. Respiratory system: Bilateral rhonchi. Respiratory effort normal. Cardiovascular system: S1 & S2 heard, RRR. No murmurs, rubs, gallops or clicks. Gastrointestinal system: Abdomen is nondistended, soft and nontender. Normal bowel sounds heard.    Data Reviewed: I have personally reviewed following labs and imaging studies  CBC Lab Results  Component Value Date   WBC 6.2 09/18/2021   RBC 4.38 09/18/2021   HGB 13.2 09/18/2021   HCT 40.4 09/18/2021   MCV 92.2 09/18/2021   MCH 30.1 09/18/2021   PLT 195 09/18/2021   MCHC 32.7 09/18/2021   RDW 15.6 (H) 09/18/2021   LYMPHSABS 1.6 09/18/2021   MONOABS 1.1 (H) 09/18/2021   EOSABS 0.1 09/18/2021   BASOSABS 0.0 [id number]     Last metabolic panel Lab Results  Component Value Date   NA 136 09/18/2021   K 4.5 09/18/2021   CL 97 (L) 09/18/2021   CO2 27 09/18/2021   BUN 26 (H) 09/18/2021   CREATININE 0.79 09/18/2021   GLUCOSE 113 (H) 09/18/2021   GFRNONAA >60 09/18/2021   CALCIUM 9.3 09/18/2021   PHOS 4.2 09/18/2021   PROT 7.0 09/18/2021   ALBUMIN 2.5 (L) 09/18/2021   BILITOT 0.3 09/18/2021   ALKPHOS 65 09/18/2021   AST 25 09/18/2021   ALT 30 09/18/2021   ANIONGAP 12 09/18/2021    GFR: Estimated Creatinine Clearance: 129.1 mL/min (by C-G formula based on SCr of 0.79 mg/dL).   Radiology Studies: No results found.    LOS: 175 days  Jacquelin Hawking, MD [facility] 09/21/2021, 9:16 AM   If 7PM-7AM, please contact night-coverage [email or web address]

## 2021-09-21 NOTE — TOC Progression Note (Addendum)
Transition of Care [facility]) - Progression Note    Patient Details  Name: Billy Cherry MRN: [id number] Date of Birth: [birth year]  Transition of Care [facility]) CM/SW Contact  Billy Bridgeman, RN Phone Number: 09/21/2021, 9:16 AM  Clinical Narrative:    CM spoke with Billy Cherry, CM with [employer] and Enteral feeding and oxygen orders are needed to complete insurance and delivery to the home this weekend prior to patient's discharge next week.  Written order request was placed on the chart and the attending physician, Dr. Caleb Popp was notified to complete with his signature and will be forwarded to [employer] to complete the authorization  Duplicate orders for DME were removed from Epic.  09/21/21 1100 - Enteral orders and oxygen orders signed by Dr. Caleb Popp and forwarded to Billy Cherry, CM with [employer] via secure email.  09/21/21 1511 - I called and spoke with the patient's wife, Billy Cherry and made her aware that if insurance authorization is approved that the patient would be able to discharge to the home on Monday as planned with [facility] care staffing available.  She is aware and agreeable to the plan to discharge.  The wife asked that [employer] set up the oxygen suction supplies at the home along with need to received the feeding pump, formula and hospital bed mattress.  I sent Billy Cherry a message and requested delivery of the equipment to the home as planned.  The patient's wife asked about a pulse oximeter for the home and I explained that pulse oximeter is not covered by the patient's insurance plan.  The patient's wife is aware.  CM and MSW with DTP Team will continue to follow the patient for discharge planning needs for home - planned discharge to home next week once authorization for private duty nursing has been completed through [employer].   Expected Discharge Plan: Home w Home Health Services Barriers to Discharge: Other (must enter comment) (Patient's wife - requested private  duty services through alternative provider for 12 hours shifts for staffing at the home.)  Expected Discharge Plan and Services Expected Discharge Plan: Home w Home Health Services In-house Referral: Clinical Social Work Discharge Planning Services: CM Consult, Follow-up appt scheduled Post Acute Care Choice: Home Health Living arrangements for the past 2 months: Single Family Home Expected Discharge Date: 08/07/21               DME Arranged: Janina Mayo supplies, Tube feeding, Suction, Hospital bed, Oxygen DME Agency: [employer]       HH Arranged: RN (Private Duty Nursing through [employer] versus [place]) HH Agency: [employer] [facility] Date HH Agency Contacted: 07/04/21 Time HH Agency Contacted: 1000 Representative spoke with at [facility] Agency: Billy Cherry, Director with [employer] [phone]   Social Determinants of Health (SDOH) Interventions    Readmission Risk Interventions    04/14/2021   11:14 AM  Readmission Risk Prevention Plan  Transportation Screening Complete  HRI or Home Care Consult Complete  Social Work Consult for Recovery Care Planning/Counseling Complete  Palliative Care Screening Complete  Medication Review Oceanographer) Complete

## 2021-09-22 NOTE — Procedures (Signed)
Tracheostomy Change Note  Patient Details:   Name: Billy Cherry DOB: 1970-12-28 MRN: [id number]    Airway Documentation:     Evaluation  O2 sats: stable throughout Complications: No apparent complications Patient did tolerate procedure well. Bilateral Breath Sounds: Diminished, Rhonchi    Pt trach changed to #6 Shiley flex uncuffed per RT protocol. RT x2 at bedside, placement confirmed with color change, pt tolerated well, RN aware.RT will monitor.   Rosalita Levan 09/22/2021, 3:56 PM

## 2021-09-22 NOTE — Progress Notes (Signed)
PROGRESS NOTE    Billy Cherry  QVZ:[id number] DOB: [birth year] DOA: 03/30/2021 PCP: Patient, No Pcp Per   Brief Narrative: Patient is a 51 years old male with past medical history of hypertension who was brought into the hospital after being found unresponsive by his wife.  Patient was initially admitted to [facility] hospital for out of hospital cardiac arrest and ventricular fibrillation with respiratory arrest suspected from angioedema from ACE inhibitor leading to asphyxiation.  Patient was then subsequently transferred to [facility].  Below is the Sequence of events during hospitalization,  1/19 admitted to [facility] ICU, Left heart cath without significant CAD.  Concern for anoxic brain injury 1/21: MRI shows areas of hypoxic-ischemic injury bilaterally. Fevered; cultures obtained. 1/25: Neuro exam unchanged.  Does exhibit new eyelid tremor like movements and lip/jaw tremor like vs clonus movement.  Bolused Keppra and increased maintenance dose.  Repeat EEG. CTH: concerning for progression of anoxic injury.  1/26: Repeat EEG unchanged and continues to show myoclonic seizures despite addition of Valproic acid, Transfer to [facility] from [place] for continuous EEG 1/28: Continues to have myoclonic sz on EEG 2/6 New fever to 102, WBC 16K - treated with vancomycin for 7d based on cultures.  2/11: percutaneous tracheostomy 2/13 >2/20 on trach collar. No vent.  2/20 Trach change to 6 cuffless 2/22 PEG tube placement. 3/13: Doppler positive for left leg DVT, eliquis started 06/06/2021 CT of the chest with new PE and haziness consistent with pneumonia, patient was briefly on heparin but was placed back on Eliquis per [facility] 4/05-4/11 treated with Unasyn for aspiration pneumonia.  Continues to have low-grade temps, suspect silent/occult aspiration 08/04/21- Patient remains unresponsive.  Awaiting for placement and disposition has been difficult 6/21 > 6/27: positive blood cultures (staph  capitis/epidermidis), likely contamination. No antibiotics initiated.  Now plan is to go home with home therapy.  Social worker and case manager is currently working on the case.  Plan to discharge potentially on 7/24.   Assessment and Plan:  S/p Cardiac arrest Postarrest anoxic encephalopathy Postarrest respiratory failure s/p tracheostomy S/P PEG tube placement 2D echocardiogram showed reduced LV function at <20% with global hypokinesis.  Now status post trach and PEG.  Remains obtunded with anoxic encephalopathy. -Continue Keppra  Anoxic encephalopathy ([name]) MRI brain significant for hypoxic brain injury. Continue supportive care. Patient currently keeps his eyes open, but does not answer questions or follow commands. Unchanged neurologic status   Acute respiratory failure with hypoxia Status post trach collar. Continue supportive care. Now chronic.   RLL pneumonia Previously had completed vancomycin for MSSA and [name] in early march.  Received more than 7-day course of IV Fortaz. On 4/3, CT chest was ordered that indicated an area of alveolar infiltration that may represent pneumonia.  Tracheal aspirate has been sent for culture and is significant for normal respiratory flora. Concerning for aspiration. Completed course of Zosyn. Patient with worsening secretions. Chest x-ray on 6/11 concerning for possible pneumonia. Patient retreated for pneumonia with Cefepime x7 days. Patient has developed worsening secretions. CT chest negative for recurrent pneumonia.  Positive blood cultures Tmax of 100 F on 6/19. No clear source, although patient has had multiple episodes of aspiration pneumonia. Blood cultures (6/20) obtained and are positive for staphylococcus capitis. Repeat blood cultures (6/20) preliminarily positive for GPC in clusters. Discussed with ID. Repeat cultures with different species of staphylococcus. Likely contaminant. Antibiotics held.   DVT (deep venous  thrombosis) ([name]) Duplex ultrasound of lower extremity on  05/15/2021 showed age indeterminate deep vein thrombosis involving the left femoral vein, left proximal profunda vein, left posterior tibial veins, left popliteal vein, and left peroneal veins. -Continue Eliquis   Dysphagia On bolus feeds per dietitian initially and transitioned to continuous feeds -Aspiration precautions -Continue tube feeds  Emesis Unsure of etiology. Tube feeds held and eventually resumed. Abdominal x-ray without clear etiology. Obtained CT abdomen/pelvis which did not identify an etiology for symptoms. Patient was transitioned from bolus tube feeds to continuous. -Zofran PRN   Pulmonary embolus ([name]) Noted to have possible acute on chronic pulmonary embolism on CT.  Also have some evidence of possible pulmonary hypertension.  Patient was previously managed on Eliquis which is transitioned to Lovenox -Continue Eliquis   Pressure injury of skin Lateral penis, posterior right heel, right foot. Not present on admission.   HTN (hypertension) Blood pressure continues to be elevated. -Continue Coreg, Cardura, isosorbide dinitrate and hydralazine -PRN hydralazine   HFrEF (heart failure with reduced ejection fraction) ([name]) 2D echocardiogram-LVEF < 20%,  -Continue coreg, imdur, spironolactone   Myoclonus Seen by neurology. Per neuro notes, suppressing his myoclonus will not ultimately change the prognosis. -Continue Keppra, Klonopin  Peripheral edema Possibly secondary to amount of fluid patient is receiving via PEG tube for feeds and medicine. Weight stable.   Hypernatremia-resolved Continue free water flushes   DVT prophylaxis: Lovenox Code Status:   Code Status: Full Code Family Communication: None at bedside Disposition Plan: Discharge home with home health services on 7/24   Consultants:  Infectious disease Palliative care Neurology Cardiology Critical care  Procedures:  Tracheostomy PEG  tube  Antimicrobials: Unasyn Cefepime Ceftazidime Vancomycin Cefazolin   Subjective: Non-verbal  Objective: BP 114/86 (BP Location: Right Wrist)   Pulse 94   Temp 98.8 F (37.1 C) (Oral)   Resp (!) 23   Ht 5\' 10"  (1.778 m)   Wt 96 kg   SpO2 95%   BMI 30.37 kg/m   Examination:  General: No distress Respiratory: Clear to auscultation bilaterally. Unlabored work of breathing. No wheezing or rales.   Data Reviewed: I have personally reviewed following labs and imaging studies  CBC Lab Results  Component Value Date   WBC 6.2 09/18/2021   RBC 4.38 09/18/2021   HGB 13.2 09/18/2021   HCT 40.4 09/18/2021   MCV 92.2 09/18/2021   MCH 30.1 09/18/2021   PLT 195 09/18/2021   MCHC 32.7 09/18/2021   RDW 15.6 (H) 09/18/2021   LYMPHSABS 1.6 09/18/2021   MONOABS 1.1 (H) 09/18/2021   EOSABS 0.1 09/18/2021   BASOSABS 0.0 09/18/2021     Last metabolic panel Lab Results  Component Value Date   NA 136 09/18/2021   K 4.5 09/18/2021   CL 97 (L) 09/18/2021   CO2 27 09/18/2021   BUN 26 (H) 09/18/2021   CREATININE 0.79 09/18/2021   GLUCOSE 113 (H) 09/18/2021   GFRNONAA >60 09/18/2021   CALCIUM 9.3 09/18/2021   PHOS 4.2 09/18/2021   PROT 7.0 09/18/2021   ALBUMIN 2.5 (L) 09/18/2021   BILITOT 0.3 09/18/2021   ALKPHOS 65 09/18/2021   AST 25 09/18/2021   ALT 30 09/18/2021   ANIONGAP 12 09/18/2021    GFR: Estimated Creatinine Clearance: 128.4 mL/min (by C-G formula based on SCr of 0.79 mg/dL).   Radiology Studies: No results found.    LOS: 176 days    09/20/2021, MD [facility] 09/22/2021, 12:51 PM   If 7PM-7AM, please contact night-coverage [email or web address]

## 2021-09-22 NOTE — TOC Progression Note (Addendum)
Transition of Care [facility]) - Progression Note    Patient Details  Name: Billy Cherry MRN: [id number] Date of Birth: 1970/08/06  Transition of Care [facility]) CM/SW Contact  Janae Bridgeman, RN Phone Number: 09/22/2021, 9:37 AM  Clinical Narrative:    CM called and spoke with Jeanmarie Plant, Director with [facility] and he is currently in meeting and will follow up with [facility] regarding pending insurance authorization for Private duty nursing care in the home.    09/22/2021 1224 - CM called and spoke with Jeanmarie Plant, CM with [facility] [phone]- and he states they have received a partial approval for private duty nursing in the home but need physician signature from the PCP to complete needed authorization.  I provided Tod Persia with the contact information for the PCP along with fax number so they could follow up with the provider.  Tod Persia, CM has not confirmed a discharge date at this time - Tod Persia states he will follow up with me this afternoon to discuss discharge date.  I called and spoke with Verdell Face, CM with [facility] [phone] - and they are planning on providing PM private duty and weekend hours for the patient's wife.  Start of care for [facility] is planned for next Thursday, 09/28/21.  Black states that [facility] completed home assessment yesterday at 2 pm following home assessment by [facility] yesterday at 10 am.  [facility] is sending the private duty nurses to the patient's home tonight for a meeting with the wife in person.  CM called and spoke with Oletha Cruel, CM with [employer] and [employer] is planning to deliver the APP mattress and tube feeding pump with equipment to the home this weekend prior to patient's discharge to home the first of next week.  [employer] RT will meet with the patient/family after discharge to the home for follow up regarding respiratory needs and tube feeding.  [employer] states that patient's Enteral tube feeding formula has been shipped to the home.  CM will call  [facility] the morning of discharge to have extra bottle of Tube feeding sent with the patient to the home.  [facility] will be arranged for transport to the home the day of discharge once this date has been confirmed.   Expected Discharge Plan: Home w Home Health Services Barriers to Discharge: Other (must enter comment) (Patient's wife - requested private duty services through alternative provider for 12 hours shifts for staffing at the home.)  Expected Discharge Plan and Services Expected Discharge Plan: Home w Home Health Services In-house Referral: Clinical Social Work Discharge Planning Services: CM Consult, Follow-up appt scheduled Post Acute Care Choice: Home Health Living arrangements for the past 2 months: Single Family Home Expected Discharge Date: 08/07/21               DME Arranged: Janina Mayo supplies, Tube feeding, Suction, Hospital bed, Oxygen DME Agency: [employer]       HH Arranged: RN (Private Duty Nursing through [employer] versus [place]) HH Agency: [employer] [facility] Date HH Agency Contacted: 07/04/21 Time HH Agency Contacted: 1000 Representative spoke with at [facility] Agency: Jeanmarie Plant, Director with [employer] [phone]   Social Determinants of Health (SDOH) Interventions    Readmission Risk Interventions    04/14/2021   11:14 AM  Readmission Risk Prevention Plan  Transportation Screening Complete  HRI or Home Care Consult Complete  Social Work Consult for Recovery Care Planning/Counseling Complete  Palliative Care Screening Complete  Medication Review [employer]) Complete

## 2021-09-23 NOTE — Plan of Care (Signed)
  Problem: Clinical Measurements: Goal: Will remain free from infection Outcome: Progressing Goal: Diagnostic test results will improve Outcome: Progressing Goal: Cardiovascular complication will be avoided Outcome: Progressing   

## 2021-09-23 NOTE — Progress Notes (Signed)
PROGRESS NOTE    Billy Cherry  HMC:[id number] DOB: [birth year] DOA: 03/30/2021 PCP: Patient, No Pcp Per   Brief Narrative: Patient is a 51 years old male with past medical history of hypertension who was brought into the hospital after being found unresponsive by his wife.  Patient was initially admitted to [facility] hospital for out of hospital cardiac arrest and ventricular fibrillation with respiratory arrest suspected from angioedema from ACE inhibitor leading to asphyxiation.  Patient was then subsequently transferred to [facility].  Below is the Sequence of events during hospitalization,  1/19 admitted to [facility] ICU, Left heart cath without significant CAD.  Concern for anoxic brain injury 1/21: MRI shows areas of hypoxic-ischemic injury bilaterally. Fevered; cultures obtained. 1/25: Neuro exam unchanged.  Does exhibit new eyelid tremor like movements and lip/jaw tremor like vs clonus movement.  Bolused Keppra and increased maintenance dose.  Repeat EEG. CTH: concerning for progression of anoxic injury.  1/26: Repeat EEG unchanged and continues to show myoclonic seizures despite addition of Valproic acid, Transfer to [facility] from [place] for continuous EEG 1/28: Continues to have myoclonic sz on EEG 2/6 New fever to 102, WBC 16K - treated with vancomycin for 7d based on cultures.  2/11: percutaneous tracheostomy 2/13 >2/20 on trach collar. No vent.  2/20 Trach change to 6 cuffless 2/22 PEG tube placement. 3/13: Doppler positive for left leg DVT, eliquis started 06/06/2021 CT of the chest with new PE and haziness consistent with pneumonia, patient was briefly on heparin but was placed back on Eliquis per [facility] 4/05-4/11 treated with Unasyn for aspiration pneumonia.  Continues to have low-grade temps, suspect silent/occult aspiration 08/04/21- Patient remains unresponsive.  Awaiting for placement and disposition has been difficult 6/21 > 6/27: positive blood cultures (staph  capitis/epidermidis), likely contamination. No antibiotics initiated.  Now plan is to go home with home therapy.  Social worker and case manager is currently working on the case.  Plan to discharge potentially on 7/24.   Assessment and Plan:  S/p Cardiac arrest Postarrest anoxic encephalopathy Postarrest respiratory failure s/p tracheostomy S/P PEG tube placement 2D echocardiogram showed reduced LV function at <20% with global hypokinesis.  Now status post trach and PEG.  Remains obtunded with anoxic encephalopathy. -Continue Keppra  Anoxic encephalopathy ([name]) MRI brain significant for hypoxic brain injury. Continue supportive care. Patient currently keeps his eyes open, but does not answer questions or follow commands. Unchanged neurologic status   Acute respiratory failure with hypoxia Status post trach collar. Continue supportive care. Now chronic.   RLL pneumonia Previously had completed vancomycin for MSSA and [name] in early march.  Received more than 7-day course of IV Fortaz. On 4/3, CT chest was ordered that indicated an area of alveolar infiltration that may represent pneumonia.  Tracheal aspirate has been sent for culture and is significant for normal respiratory flora. Concerning for aspiration. Completed course of Zosyn. Patient with worsening secretions. Chest x-ray on 6/11 concerning for possible pneumonia. Patient retreated for pneumonia with Cefepime x7 days. Patient has developed worsening secretions. CT chest negative for recurrent pneumonia.  Positive blood cultures Tmax of 100 F on 6/19. No clear source, although patient has had multiple episodes of aspiration pneumonia. Blood cultures (6/20) obtained and are positive for staphylococcus capitis. Repeat blood cultures (6/20) preliminarily positive for GPC in clusters. Discussed with ID. Repeat cultures with different species of staphylococcus. Likely contaminant. Antibiotics held.   DVT (deep venous  thrombosis) ([name]) Duplex ultrasound of lower extremity on  05/15/2021 showed age indeterminate deep vein thrombosis involving the left femoral vein, left proximal profunda vein, left posterior tibial veins, left popliteal vein, and left peroneal veins. -Continue Eliquis   Dysphagia On bolus feeds per dietitian initially and transitioned to continuous feeds -Aspiration precautions -Continue tube feeds  Emesis Unsure of etiology. Tube feeds held and eventually resumed. Abdominal x-ray without clear etiology. Obtained CT abdomen/pelvis which did not identify an etiology for symptoms. Patient was transitioned from bolus tube feeds to continuous. -Zofran PRN   Pulmonary embolus ([name]) Noted to have possible acute on chronic pulmonary embolism on CT.  Also have some evidence of possible pulmonary hypertension.  Patient was previously managed on Eliquis which is transitioned to Lovenox -Continue Eliquis   Pressure injury of skin Lateral penis, posterior right heel, right foot. Not present on admission.   HTN (hypertension) Blood pressure continues to be elevated. -Continue Coreg, Cardura, isosorbide dinitrate and hydralazine -PRN hydralazine   HFrEF (heart failure with reduced ejection fraction) ([name]) 2D echocardiogram-LVEF < 20%,  -Continue coreg, imdur, spironolactone   Myoclonus Seen by neurology. Per neuro notes, suppressing his myoclonus will not ultimately change the prognosis. -Continue Keppra, Klonopin  Peripheral edema Possibly secondary to amount of fluid patient is receiving via PEG tube for feeds and medicine. Weight stable.   Hypernatremia-resolved Continue free water flushes   DVT prophylaxis: Lovenox Code Status:   Code Status: Full Code Family Communication: None at bedside Disposition Plan: Discharge home with home health services on 7/24   Consultants:  Infectious disease Palliative care Neurology Cardiology Critical care  Procedures:  Tracheostomy PEG  tube  Antimicrobials: Unasyn Cefepime Ceftazidime Vancomycin Cefazolin   Subjective: Non-verbal  Objective: BP (!) 146/105   Pulse 89   Temp 98.9 F (37.2 C) (Oral)   Resp 18   Ht 5\' 10"  (1.778 m)   Wt 95.9 kg   SpO2 94%   BMI 30.34 kg/m   Examination:  General exam: Appears calm and comfortable Respiratory system: Clear to auscultation. Respiratory effort normal.   Data Reviewed: I have personally reviewed following labs and imaging studies  CBC Lab Results  Component Value Date   WBC 6.2 09/18/2021   RBC 4.38 09/18/2021   HGB 13.2 09/18/2021   HCT 40.4 09/18/2021   MCV 92.2 09/18/2021   MCH 30.1 09/18/2021   PLT 195 09/18/2021   MCHC 32.7 09/18/2021   RDW 15.6 (H) 09/18/2021   LYMPHSABS 1.6 09/18/2021   MONOABS 1.1 (H) 09/18/2021   EOSABS 0.1 09/18/2021   BASOSABS 0.0 09/18/2021     Last metabolic panel Lab Results  Component Value Date   NA 136 09/18/2021   K 4.5 09/18/2021   CL 97 (L) 09/18/2021   CO2 27 09/18/2021   BUN 26 (H) 09/18/2021   CREATININE 0.79 09/18/2021   GLUCOSE 113 (H) 09/18/2021   GFRNONAA >60 09/18/2021   CALCIUM 9.3 09/18/2021   PHOS 4.2 09/18/2021   PROT 7.0 09/18/2021   ALBUMIN 2.5 (L) 09/18/2021   BILITOT 0.3 09/18/2021   ALKPHOS 65 09/18/2021   AST 25 09/18/2021   ALT 30 09/18/2021   ANIONGAP 12 09/18/2021    GFR: Estimated Creatinine Clearance: 128.4 mL/min (by C-G formula based on SCr of 0.79 mg/dL).   Radiology Studies: No results found.    LOS: 177 days    09/20/2021, MD [facility] 09/23/2021, 2:24 PM   If 7PM-7AM, please contact night-coverage [email or web address]

## 2021-09-24 NOTE — Progress Notes (Signed)
PROGRESS NOTE    Billy Cherry  UXN:[id number] DOB: 1970/05/10 DOA: 03/30/2021 PCP: Patient, No Pcp Per   Brief Narrative: Patient is a 51 years old male with past medical history of hypertension who was brought into the hospital after being found unresponsive by his wife.  Patient was initially admitted to [facility] hospital for out of hospital cardiac arrest and ventricular fibrillation with respiratory arrest suspected from angioedema from ACE inhibitor leading to asphyxiation.  Patient was then subsequently transferred to [facility].  Below is the Sequence of events during hospitalization,  1/19 admitted to [facility] ICU, Left heart cath without significant CAD.  Concern for anoxic brain injury 1/21: MRI shows areas of hypoxic-ischemic injury bilaterally. Fevered; cultures obtained. 1/25: Neuro exam unchanged.  Does exhibit new eyelid tremor like movements and lip/jaw tremor like vs clonus movement.  Bolused Keppra and increased maintenance dose.  Repeat EEG. CTH: concerning for progression of anoxic injury.  1/26: Repeat EEG unchanged and continues to show myoclonic seizures despite addition of Valproic acid, Transfer to [facility] from [facility] for continuous EEG 1/28: Continues to have myoclonic sz on EEG 2/6 New fever to 102, WBC 16K - treated with vancomycin for 7d based on cultures.  2/11: percutaneous tracheostomy 2/13 >2/20 on trach collar. No vent.  2/20 Trach change to 6 cuffless 2/22 PEG tube placement. 3/13: Doppler positive for left leg DVT, eliquis started 06/06/2021 CT of the chest with new PE and haziness consistent with pneumonia, patient was briefly on heparin but was placed back on Eliquis per [facility] 4/05-4/11 treated with Unasyn for aspiration pneumonia.  Continues to have low-grade temps, suspect silent/occult aspiration 08/04/21- Patient remains unresponsive.  Awaiting for placement and disposition has been difficult 6/21 > 6/27: positive blood cultures (staph  capitis/epidermidis), likely contamination. No antibiotics initiated.  Now plan is to go home with home therapy.  Social worker and case manager is currently working on the case.  Plan to discharge potentially on 7/24.   Assessment and Plan:  S/p Cardiac arrest Postarrest anoxic encephalopathy Postarrest respiratory failure s/p tracheostomy S/P PEG tube placement 2D echocardiogram showed reduced LV function at <20% with global hypokinesis.  Now status post trach and PEG.  Remains obtunded with anoxic encephalopathy. -Continue Keppra  Anoxic encephalopathy ([name]) MRI brain significant for hypoxic brain injury. Continue supportive care. Patient currently keeps his eyes open, but does not answer questions or follow commands. Unchanged neurologic status   Acute respiratory failure with hypoxia Status post trach collar. Continue supportive care. Now chronic.   RLL pneumonia Previously had completed vancomycin for MSSA and [name] in early march.  Received more than 7-day course of IV Fortaz. On 4/3, CT chest was ordered that indicated an area of alveolar infiltration that may represent pneumonia.  Tracheal aspirate has been sent for culture and is significant for normal respiratory flora. Concerning for aspiration. Completed course of Zosyn. Patient with worsening secretions. Chest x-ray on 6/11 concerning for possible pneumonia. Patient retreated for pneumonia with Cefepime x7 days. Patient has developed worsening secretions. CT chest negative for recurrent pneumonia.  Positive blood cultures Tmax of 100 F on 6/19. No clear source, although patient has had multiple episodes of aspiration pneumonia. Blood cultures (6/20) obtained and are positive for staphylococcus capitis. Repeat blood cultures (6/20) preliminarily positive for GPC in clusters. Discussed with ID. Repeat cultures with different species of staphylococcus. Likely contaminant. Antibiotics held.   DVT (deep venous  thrombosis) ([name]) Duplex ultrasound of lower extremity on  05/15/2021 showed age indeterminate deep vein thrombosis involving the left femoral vein, left proximal profunda vein, left posterior tibial veins, left popliteal vein, and left peroneal veins. -Continue Eliquis   Dysphagia On bolus feeds per dietitian initially and transitioned to continuous feeds -Aspiration precautions -Continue tube feeds  Emesis Unsure of etiology. Tube feeds held and eventually resumed. Abdominal x-ray without clear etiology. Obtained CT abdomen/pelvis which did not identify an etiology for symptoms. Patient was transitioned from bolus tube feeds to continuous. -Zofran PRN   Pulmonary embolus ([name]) Noted to have possible acute on chronic pulmonary embolism on CT.  Also have some evidence of possible pulmonary hypertension.  Patient was previously managed on Eliquis which is transitioned to Lovenox -Continue Eliquis   Pressure injury of skin Lateral penis, posterior right heel, right foot. Not present on admission.   HTN (hypertension) Blood pressure continues to be elevated. -Continue Coreg, Cardura, isosorbide dinitrate and hydralazine -PRN hydralazine   HFrEF (heart failure with reduced ejection fraction) ([name]) 2D echocardiogram-LVEF < 20%,  -Continue coreg, imdur, spironolactone   Myoclonus Seen by neurology. Per neuro notes, suppressing his myoclonus will not ultimately change the prognosis. -Continue Keppra, Klonopin  Peripheral edema Possibly secondary to amount of fluid patient is receiving via PEG tube for feeds and medicine. Weight stable.   Hypernatremia-resolved Continue free water flushes   DVT prophylaxis: Lovenox Code Status:   Code Status: Full Code Family Communication: None at bedside Disposition Plan: Discharge home with home health services on 7/24   Consultants:  Infectious disease Palliative care Neurology Cardiology Critical care  Procedures:  Tracheostomy PEG  tube  Antimicrobials: Unasyn Cefepime Ceftazidime Vancomycin Cefazolin   Subjective: Non-verbal. No issues noted overnight. Afebrile.  Objective: BP (!) 118/92 (BP Location: Left Arm)   Pulse 89   Temp 98.7 F (37.1 C) (Oral)   Resp (!) 22   Ht 5\' 10"  (1.778 m)   Wt 95.2 kg   SpO2 100%   BMI 30.11 kg/m   Examination:  General: No distress Respiratory: Clear to auscultation bilaterally. Unlabored work of breathing. No wheezing or rales.   Data Reviewed: I have personally reviewed following labs and imaging studies  CBC Lab Results  Component Value Date   WBC 6.2 09/18/2021   RBC 4.38 09/18/2021   HGB 13.2 09/18/2021   HCT 40.4 09/18/2021   MCV 92.2 09/18/2021   MCH 30.1 09/18/2021   PLT 195 09/18/2021   MCHC 32.7 09/18/2021   RDW 15.6 (H) 09/18/2021   LYMPHSABS 1.6 09/18/2021   MONOABS 1.1 (H) 09/18/2021   EOSABS 0.1 09/18/2021   BASOSABS 0.0 09/18/2021     Last metabolic panel Lab Results  Component Value Date   NA 136 09/18/2021   K 4.5 09/18/2021   CL 97 (L) 09/18/2021   CO2 27 09/18/2021   BUN 26 (H) 09/18/2021   CREATININE 0.79 09/18/2021   GLUCOSE 113 (H) 09/18/2021   GFRNONAA >60 09/18/2021   CALCIUM 9.3 09/18/2021   PHOS 4.2 09/18/2021   PROT 7.0 09/18/2021   ALBUMIN 2.5 (L) 09/18/2021   BILITOT 0.3 09/18/2021   ALKPHOS 65 09/18/2021   AST 25 09/18/2021   ALT 30 09/18/2021   ANIONGAP 12 09/18/2021    GFR: Estimated Creatinine Clearance: 128 mL/min (by C-G formula based on SCr of 0.79 mg/dL).   Radiology Studies: No results found.    LOS: 178 days    09/20/2021, MD [facility] 09/24/2021, 9:30 AM   If 7PM-7AM, please contact night-coverage [email or web address]

## 2021-09-24 NOTE — Plan of Care (Signed)
  Problem: Clinical Measurements: Goal: Diagnostic test results will improve Outcome: Progressing   Problem: Nutrition: Goal: Adequate nutrition will be maintained Outcome: Progressing   Problem: Coping: Goal: Level of anxiety will decrease Outcome: Progressing   

## 2021-09-24 NOTE — Plan of Care (Signed)

## 2021-09-25 NOTE — Progress Notes (Signed)
PROGRESS NOTE    Billy Cherry  UXN:[id number] DOB: 1970/05/10 DOA: 03/30/2021 PCP: Patient, No Pcp Per   Brief Narrative: Patient is a 51 years old male with past medical history of hypertension who was brought into the hospital after being found unresponsive by his wife.  Patient was initially admitted to [facility] hospital for out of hospital cardiac arrest and ventricular fibrillation with respiratory arrest suspected from angioedema from ACE inhibitor leading to asphyxiation.  Patient was then subsequently transferred to [facility].  Below is the Sequence of events during hospitalization,  1/19 admitted to [facility] ICU, Left heart cath without significant CAD.  Concern for anoxic brain injury 1/21: MRI shows areas of hypoxic-ischemic injury bilaterally. Fevered; cultures obtained. 1/25: Neuro exam unchanged.  Does exhibit new eyelid tremor like movements and lip/jaw tremor like vs clonus movement.  Bolused Keppra and increased maintenance dose.  Repeat EEG. CTH: concerning for progression of anoxic injury.  1/26: Repeat EEG unchanged and continues to show myoclonic seizures despite addition of Valproic acid, Transfer to [facility] from [facility] for continuous EEG 1/28: Continues to have myoclonic sz on EEG 2/6 New fever to 102, WBC 16K - treated with vancomycin for 7d based on cultures.  2/11: percutaneous tracheostomy 2/13 >2/20 on trach collar. No vent.  2/20 Trach change to 6 cuffless 2/22 PEG tube placement. 3/13: Doppler positive for left leg DVT, eliquis started 06/06/2021 CT of the chest with new PE and haziness consistent with pneumonia, patient was briefly on heparin but was placed back on Eliquis per [facility] 4/05-4/11 treated with Unasyn for aspiration pneumonia.  Continues to have low-grade temps, suspect silent/occult aspiration 08/04/21- Patient remains unresponsive.  Awaiting for placement and disposition has been difficult 6/21 > 6/27: positive blood cultures (staph  capitis/epidermidis), likely contamination. No antibiotics initiated.  Now plan is to go home with home therapy.  Social worker and case manager is currently working on the case.  Plan to discharge potentially on 7/24.   Assessment and Plan:  S/p Cardiac arrest Postarrest anoxic encephalopathy Postarrest respiratory failure s/p tracheostomy S/P PEG tube placement 2D echocardiogram showed reduced LV function at <20% with global hypokinesis.  Now status post trach and PEG.  Remains obtunded with anoxic encephalopathy. -Continue Keppra  Anoxic encephalopathy ([name]) MRI brain significant for hypoxic brain injury. Continue supportive care. Patient currently keeps his eyes open, but does not answer questions or follow commands. Unchanged neurologic status   Acute respiratory failure with hypoxia Status post trach collar. Continue supportive care. Now chronic.   RLL pneumonia Previously had completed vancomycin for MSSA and [name] in early march.  Received more than 7-day course of IV Fortaz. On 4/3, CT chest was ordered that indicated an area of alveolar infiltration that may represent pneumonia.  Tracheal aspirate has been sent for culture and is significant for normal respiratory flora. Concerning for aspiration. Completed course of Zosyn. Patient with worsening secretions. Chest x-ray on 6/11 concerning for possible pneumonia. Patient retreated for pneumonia with Cefepime x7 days. Patient has developed worsening secretions. CT chest negative for recurrent pneumonia.  Positive blood cultures Tmax of 100 F on 6/19. No clear source, although patient has had multiple episodes of aspiration pneumonia. Blood cultures (6/20) obtained and are positive for staphylococcus capitis. Repeat blood cultures (6/20) preliminarily positive for GPC in clusters. Discussed with ID. Repeat cultures with different species of staphylococcus. Likely contaminant. Antibiotics held.   DVT (deep venous  thrombosis) ([name]) Duplex ultrasound of lower extremity on  05/15/2021 showed age indeterminate deep vein thrombosis involving the left femoral vein, left proximal profunda vein, left posterior tibial veins, left popliteal vein, and left peroneal veins. -Continue Eliquis   Dysphagia On bolus feeds per dietitian initially and transitioned to continuous feeds -Aspiration precautions -Continue tube feeds  Emesis Unsure of etiology. Tube feeds held and eventually resumed. Abdominal x-ray without clear etiology. Obtained CT abdomen/pelvis which did not identify an etiology for symptoms. Patient was transitioned from bolus tube feeds to continuous. -Zofran PRN   Pulmonary embolus ([name]) Noted to have possible acute on chronic pulmonary embolism on CT.  Also have some evidence of possible pulmonary hypertension.  Patient was previously managed on Eliquis which is transitioned to Lovenox -Continue Eliquis   Pressure injury of skin Lateral penis, posterior right heel, right foot. Not present on admission.   HTN (hypertension) Blood pressure continues to be elevated. -Continue Coreg, Cardura, isosorbide dinitrate and hydralazine -PRN hydralazine   HFrEF (heart failure with reduced ejection fraction) ([name]) 2D echocardiogram-LVEF < 20%,  -Continue coreg, imdur, spironolactone   Myoclonus Seen by neurology. Per neuro notes, suppressing his myoclonus will not ultimately change the prognosis. -Continue Keppra, Klonopin  Peripheral edema Possibly secondary to amount of fluid patient is receiving via PEG tube for feeds and medicine. Weight stable.   Hypernatremia-resolved Continue free water flushes   DVT prophylaxis: Lovenox Code Status:   Code Status: Full Code Family Communication: None at bedside Disposition Plan: Discharge home with home health services initially planned for 7/24. Per TOC, new barriers to discharge include further documentation required for home health services with a new  expected discharge date of 7/27.   Consultants:  Infectious disease Palliative care Neurology Cardiology Critical care  Procedures:  Tracheostomy PEG tube  Antimicrobials: Unasyn Cefepime Ceftazidime Vancomycin Cefazolin   Subjective: Non-verbal. No issues.   Objective: BP 104/78 (BP Location: Right Arm)   Pulse 91   Temp 97.7 F (36.5 C) (Oral)   Resp (!) 22   Ht 5\' 10"  (1.778 m)   Wt 97 kg   SpO2 95%   BMI 30.68 kg/m   Examination:  General exam: Appears calm and comfortable. Respiratory system: Mild transmitted upper airway rhonchi. Respiratory effort normal.   Data Reviewed: I have personally reviewed following labs and imaging studies  CBC Lab Results  Component Value Date   WBC 6.2 09/18/2021   RBC 4.38 09/18/2021   HGB 13.2 09/18/2021   HCT 40.4 09/18/2021   MCV 92.2 09/18/2021   MCH 30.1 09/18/2021   PLT 195 09/18/2021   MCHC 32.7 09/18/2021   RDW 15.6 (H) 09/18/2021   LYMPHSABS 1.6 09/18/2021   MONOABS 1.1 (H) 09/18/2021   EOSABS 0.1 09/18/2021   BASOSABS 0.0 09/18/2021     Last metabolic panel Lab Results  Component Value Date   NA 136 09/18/2021   K 4.5 09/18/2021   CL 97 (L) 09/18/2021   CO2 27 09/18/2021   BUN 26 (H) 09/18/2021   CREATININE 0.79 09/18/2021   GLUCOSE 113 (H) 09/18/2021   GFRNONAA >60 09/18/2021   CALCIUM 9.3 09/18/2021   PHOS 4.2 09/18/2021   PROT 7.0 09/18/2021   ALBUMIN 2.5 (L) 09/18/2021   BILITOT 0.3 09/18/2021   ALKPHOS 65 09/18/2021   AST 25 09/18/2021   ALT 30 09/18/2021   ANIONGAP 12 09/18/2021    GFR: Estimated Creatinine Clearance: 129.1 mL/min (by C-G formula based on SCr of 0.79 mg/dL).   Radiology Studies: No results found.    LOS: 179  days    Jacquelin Hawking, MD [facility] 09/25/2021, 11:48 AM   If 7PM-7AM, please contact night-coverage [email or web address]

## 2021-09-25 NOTE — Progress Notes (Signed)
  NAME:  Billy Cherry, MRN:  [id number], DOB:  1970/10/04, LOS: 179 ADMISSION DATE:  03/30/2021, CONSULTATION DATE:  1/19 REFERRING MD:  Evette Georges, CHIEF COMPLAINT:  Found down, cardiac arrest   Brief Pt Description / Synopsis:  51 year old male with out-of-hospital V. fib cardiac arrest in setting of respiratory arrest due to suspected angioedema from ACE inhibitor leading to asphyxiation.  Concern on his 12 lead EKG for anterolateral ST elevation so he was taken to the cath lab where his study showed no significant coronary artery disease and an LVEF < 20%  Now with anoxic brain injury.  Pertinent  Medical History  Hypertension  Significant Hospital Events: Including procedures, antibiotic start and stop dates in addition to other pertinent events   1/19 admission, left heart cath without significant CAD.  Concern for anoxic brain injury 1/20: MRI pending, Neuro following 1/21: MRI shows areas of hypoxic-ischemic injury bilaterally. Fevered; cultures obtained. 1/23 severe brain damage 1/24 evidence of severe brain damage, remains critically ill 1/25: Plan for PEG today. Neuro exam unchanged.  Does exhibit new eyelid tremor like movements and lip/jaw tremor like vs clonus movement.  Bolused Keppra and increased maintenance dose.  Repeat EEG. CTH: concerning for progression of anoxic injury.  1/26: Repeat EEG unchanged and continues to show myoclonic seizures despite addition of Valproic acid yesterday. transfer to [facility] for continuous EEG 1/28: Continues to have myoclonic sz on EEG 1/29 No acute issues overnight 2/3 no meaningful neuro recovery. Diuresed 2/5 unchanged 2/6 New fever to 102, WBC 16K - treated with vancomycin for 7d based on cultures.  2/11: percutaneous tracheostomy 2/13 >2/20 on trach collar. No vent.  2/20 Trach change to 6 cuffless 2/27 status unchanged 3/13 unchanged 3/20 due for trach exchange given continue secretions (although improved) will stick to 6 cuffless  for exchange  06/05/2021 Continued thick  secretions, scant blood tinged 4/3/am 06/06/2021 CT of the chest with new PE and haziness consistent with pneumonia 5/30 continue tube feeding/free water  Interim History / Subjective:   Patient unresponsive, trach collar in place.  Still has daily significant secretions.  Objective   Blood pressure 104/78, pulse 91, temperature 97.7 F (36.5 C), temperature source Oral, resp. rate (!) 22, height 5\' 10"  (1.778 m), weight 97 kg, SpO2 95 %.    FiO2 (%):  [21 %-28 %] 21 %   Intake/Output Summary (Last 24 hours) at 09/25/2021 1046 Last data filed at 09/25/2021 0543 Gross per 24 hour  Intake 0 ml  Output 1350 ml  Net -1350 ml     Filed Weights   09/23/21 0500 09/24/21 0417 09/25/21 0500  Weight: 95.9 kg 95.2 kg 97 kg   Examination:  Nearly comatose, NAD Trach in place, minimal secretions No response to pain Shallow but clear lung sounds Ext trace edema  No new imaging  Assessment & Plan:  Out of hospital V-fib arrest Postarrest anoxic brain injury Myoclonus Respiratory failure with prolonged mechanical ventilation Tracheostomy dependent Cardiomyopathy Pulmonary embolism Hypertension  Plan: Continue routine trach care As needed suctioning Not a candidate for decannulation due to mental status Continue atropine, glycopyrrolate and scopolamine patches for secretion control Continue chest PT Continue anticoagulation Overall prognosis remains poor.  Pulmonary will see weekly for trach management. Please call 09/27/21 sooner if needed.  [place], MD  Pulmonary Critical Care 09/25/2021 10:46 AM

## 2021-09-25 NOTE — Progress Notes (Addendum)
1:40pm: CSW spoke with Amber at [employer] who states the form was sent to [facility].  CSW spoke with Jess Barters at [place] who states he hasn't received the form and he will contact [employer] directly.  9:20am: CSW spoke with Jess Barters at [place] who states the agency is still waiting on the 3508 form to be completed by the MD from [employer].  CSW spoke with Amber at [employer] who states the form was not received.   CSW spoke with Jess Barters again to request the form be resent via fax to [phone].  CSW spoke with Burnett Harry at [facility] who states the agency is ready to initiate services with the patient in the home on Thursday evening, 7/27.   Edwin Dada, MSW, LCSW Transitions of Care  Clinical Social Worker II [phone]

## 2021-09-26 NOTE — Progress Notes (Addendum)
11:30am: CSW spoke with patient's spouse Retillas to discuss the discharge plan. Retillas confirmed that the DME has been delivered and she is ready to receive the patient at home as soon as [facility] is able to provide a time and date of service initiation.    9:15am: CSW spoke with Weston Brass at [place] who states the necessary form still has not been received.  CSW spoke with representative at [employer] who states the patient's PCP will be Dr. Melynda Ripple.  CSW spoke with Weston Brass again to provide him with updated information and request a new form be faxed to [employer] for completion.  Edwin Dada, MSW, LCSW Transitions of Care  Clinical Social Worker II [phone]

## 2021-09-26 NOTE — Progress Notes (Signed)
PROGRESS NOTE    Billy Cherry  IZT:[id number] DOB: 19-Mar-1970 DOA: 03/30/2021 PCP: Billy Cherry, No Pcp Per   Brief Narrative: Billy Cherry is a 51 years old male with past medical history of hypertension who was brought into the hospital after being found unresponsive by his wife.  Billy Cherry was initially admitted to [facility] hospital for out of hospital cardiac arrest and ventricular fibrillation with respiratory arrest suspected from angioedema from ACE inhibitor leading to asphyxiation.  Billy Cherry was then subsequently transferred to [facility].  Below is the Sequence of events during hospitalization,  1/19 admitted to [facility] ICU, Left heart cath without significant CAD.  Concern for anoxic brain injury 1/21: MRI shows areas of hypoxic-ischemic injury bilaterally. Fevered; cultures obtained. 1/25: Neuro exam unchanged.  Does exhibit new eyelid tremor like movements and lip/jaw tremor like vs clonus movement.  Bolused Keppra and increased maintenance dose.  Repeat EEG. CTH: concerning for progression of anoxic injury.  1/26: Repeat EEG unchanged and continues to show myoclonic seizures despite addition of Valproic acid, Transfer to [facility] from [place] for continuous EEG 1/28: Continues to have myoclonic sz on EEG 2/6 New fever to 102, WBC 16K - treated with vancomycin for 7d based on cultures.  2/11: percutaneous tracheostomy 2/13 >2/20 on trach collar. No vent.  2/20 Trach change to 6 cuffless 2/22 PEG tube placement. 3/13: Doppler positive for left leg DVT, eliquis started 06/06/2021 CT of the chest with new PE and haziness consistent with pneumonia, Billy Cherry was briefly on heparin but was placed back on Eliquis per [facility] 4/05-4/11 treated with Unasyn for aspiration pneumonia.  Continues to have low-grade temps, suspect silent/occult aspiration 08/04/21- Billy Cherry remains unresponsive.  Awaiting for placement and disposition has been difficult 6/21 > 6/27: positive blood cultures (staph  capitis/epidermidis), likely contamination. No antibiotics initiated.  Now plan is to go home with home therapy.  Social worker and case manager is currently working on the case.  Plan to discharge potentially on 7/27.   Assessment and Plan:  S/p Cardiac arrest Postarrest anoxic encephalopathy Postarrest respiratory failure s/p tracheostomy S/P PEG tube placement 2D echocardiogram showed reduced LV function at <20% with global hypokinesis.  Now status post trach and PEG.  Remains obtunded with anoxic encephalopathy. -Continue Keppra  Anoxic encephalopathy ([name]) MRI brain significant for hypoxic brain injury. Continue supportive care. Billy Cherry currently keeps his eyes open, but does not answer questions or follow commands. Unchanged neurologic status   Acute respiratory failure with hypoxia Status post trach collar. Continue supportive care. Now chronic.   RLL pneumonia Previously had completed vancomycin for MSSA and [name] in early march.  Received more than 7-day course of IV Fortaz. On 4/3, CT chest was ordered that indicated an area of alveolar infiltration that may represent pneumonia.  Tracheal aspirate has been sent for culture and is significant for normal respiratory flora. Concerning for aspiration. Completed course of Zosyn. Billy Cherry with worsening secretions. Chest x-ray on 6/11 concerning for possible pneumonia. Billy Cherry retreated for pneumonia with Cefepime x7 days. Billy Cherry has developed worsening secretions. CT chest negative for recurrent pneumonia.  Positive blood cultures Tmax of 100 F on 6/19. No clear source, although Billy Cherry has had multiple episodes of aspiration pneumonia. Blood cultures (6/20) obtained and are positive for staphylococcus capitis. Repeat blood cultures (6/20) preliminarily positive for GPC in clusters. Discussed with ID. Repeat cultures with different species of staphylococcus. Likely contaminant. Antibiotics held.   DVT (deep venous  thrombosis) ([name]) Duplex ultrasound of lower extremity on  05/15/2021 showed age indeterminate deep vein thrombosis involving the left femoral vein, left proximal profunda vein, left posterior tibial veins, left popliteal vein, and left peroneal veins. -Continue Eliquis   Dysphagia On bolus feeds per dietitian initially and transitioned to continuous feeds -Aspiration precautions -Continue tube feeds  Emesis Unsure of etiology. Tube feeds held and eventually resumed. Abdominal x-ray without clear etiology. Obtained CT abdomen/pelvis which did not identify an etiology for symptoms. Billy Cherry was transitioned from bolus tube feeds to continuous. -Zofran PRN   Pulmonary embolus ([name]) Noted to have possible acute on chronic pulmonary embolism on CT.  Also have some evidence of possible pulmonary hypertension.  Billy Cherry was previously managed on Eliquis which is transitioned to Lovenox -Continue Eliquis   Pressure injury of skin Lateral penis, posterior right heel, right foot. Not present on admission.   HTN (hypertension) Blood pressure continues to be elevated. -Continue Coreg, Cardura, isosorbide dinitrate and hydralazine -PRN hydralazine   HFrEF (heart failure with reduced ejection fraction) ([name]) 2D echocardiogram-LVEF < 20%,  -Continue coreg, imdur, spironolactone   Myoclonus Seen by neurology. Per neuro notes, suppressing his myoclonus will not ultimately change the prognosis. -Continue Keppra, Klonopin  Peripheral edema Possibly secondary to amount of fluid Billy Cherry is receiving via PEG tube for feeds and medicine. Weight stable.   Hypernatremia-resolved Continue free water flushes   DVT prophylaxis: Lovenox Code Status:   Code Status: Full Code Family Communication: None at bedside Disposition Plan: Discharge home with home health services initially planned for 7/24. Per TOC, new barriers to discharge include further documentation required for home health services with a new  expected discharge date of 7/27.   Consultants:  Infectious disease Palliative care Neurology Cardiology Critical care  Procedures:  Tracheostomy PEG tube  Antimicrobials: Unasyn Cefepime Ceftazidime Vancomycin Cefazolin   Subjective: Non-verbal. Afebrile. No issues noted from overnight documentation  Objective: BP 114/81 (BP Location: Right Arm)   Pulse 88   Temp 98.1 F (36.7 C) (Oral)   Resp 18   Ht 5\' 10"  (1.778 m)   Wt 97 kg   SpO2 97%   BMI 30.68 kg/m   Examination:  General exam: Appears calm and comfortable Respiratory system: Clear to auscultation. Respiratory effort normal. Cardiovascular system: S1 & S2 heard, RRR. No murmurs, rubs, gallops or clicks. Gastrointestinal system: Abdomen is nondistended, soft and nontender.  Central nervous system: Baseline    Data Reviewed: I have personally reviewed following labs and imaging studies  CBC Lab Results  Component Value Date   WBC 6.2 09/18/2021   RBC 4.38 09/18/2021   HGB 13.2 09/18/2021   HCT 40.4 09/18/2021   MCV 92.2 09/18/2021   MCH 30.1 09/18/2021   PLT 195 09/18/2021   MCHC 32.7 09/18/2021   RDW 15.6 (H) 09/18/2021   LYMPHSABS 1.6 09/18/2021   MONOABS 1.1 (H) 09/18/2021   EOSABS 0.1 09/18/2021   BASOSABS 0.0 09/18/2021     Last metabolic panel Lab Results  Component Value Date   NA 136 09/18/2021   K 4.5 09/18/2021   CL 97 (L) 09/18/2021   CO2 27 09/18/2021   BUN 26 (H) 09/18/2021   CREATININE 0.79 09/18/2021   GLUCOSE 113 (H) 09/18/2021   GFRNONAA >60 09/18/2021   CALCIUM 9.3 09/18/2021   PHOS 4.2 09/18/2021   PROT 7.0 09/18/2021   ALBUMIN 2.5 (L) 09/18/2021   BILITOT 0.3 09/18/2021   ALKPHOS 65 09/18/2021   AST 25 09/18/2021   ALT 30 09/18/2021   ANIONGAP 12 09/18/2021  GFR: Estimated Creatinine Clearance: 129.1 mL/min (by C-G formula based on SCr of 0.79 mg/dL).   Radiology Studies: No results found.    LOS: 180 days    Jacquelin Hawking, MD [facility] 09/26/2021, 3:02 PM   If 7PM-7AM, please contact night-coverage [email or web address]

## 2021-09-27 LAB — CBC
HCT: 38.4 % — ABNORMAL LOW (ref 39.0–52.0)
Hemoglobin: 13 g/dL (ref 13.0–17.0)
MCH: 31.3 pg (ref 26.0–34.0)
MCHC: 33.9 g/dL (ref 30.0–36.0)
MCV: 92.5 fL (ref 80.0–100.0)
Platelets: 192 10*3/uL (ref 150–400)
RBC: 4.15 MIL/uL — ABNORMAL LOW (ref 4.22–5.81)
RDW: 15.5 % (ref 11.5–15.5)
WBC: 5.4 10*3/uL (ref 4.0–10.5)
nRBC: 0 % (ref 0.0–0.2)

## 2021-09-27 LAB — BASIC METABOLIC PANEL
Anion gap: 9 (ref 5–15)
BUN: 26 mg/dL — ABNORMAL HIGH (ref 6–20)
CO2: 27 mmol/L (ref 22–32)
Calcium: 9.3 mg/dL (ref 8.9–10.3)
Chloride: 98 mmol/L (ref 98–111)
Creatinine, Ser: 0.79 mg/dL (ref 0.61–1.24)
GFR, Estimated: >60 mL/min (ref >60)
Glucose, Bld: 103 mg/dL — ABNORMAL HIGH (ref 70–99)
Potassium: 4.4 mmol/L (ref 3.5–5.1)
Sodium: 134 mmol/L — ABNORMAL LOW (ref 135–145)

## 2021-09-27 NOTE — Progress Notes (Addendum)
2pm: CSW spoke with Vance Gather of [facility] at bedside to discuss discharge plan. [facility] will begin their care on 8/2 when patient is discharged home.  12:45pm: CSW spoke with Jess Barters who states the patient's definite discharge date is 10/04/2021.   9:30am: CSW spoke with Jess Barters at [facility] who states he is communicating with the RN staff at the agency to determine the date services can begin. Jess Barters will return call to CSW.  Edwin Dada, MSW, LCSW Transitions of Care  Clinical Social Worker II [phone]

## 2021-09-27 NOTE — Progress Notes (Signed)
  Progress Note   Patient: Billy Cherry [id number] DOB: Jun 01, 1970 DOA: 03/30/2021     181 DOS: the patient was seen and examined on 09/27/2021 at 11:31AM      Brief hospital course: Mr. Secrist is a 51 y.o. M with out of hospital cardiac arrest, now with persistent vegetative state.  Please see prior summaries.        Assessment and Plan: * Cardiac arrest Anoxic encephalopathy Dysphagia Myoclonus No change - Consult trach team - Continue tube feeds - Consult TOC - Continue Scopolamine, Imodium - Continue PPI  - Continue Keppra, clonazepam, Depakote    Pulmonary embolus ([name]) - Continue Eliquis   HTN (hypertension) HFrEF (heart failure with reduced ejection fraction) ([name]) BP controlled.  Apepars euvolemic.  EF <20% this hospitalization. - Continue Coreg, hydralazine, Isoridil, spironolactone        Subjective: No new nursing concerns no fever, no respiratory distress, no change in mentation.     Physical Exam: Vitals:   09/27/21 0518 09/27/21 0744 09/27/21 0818 09/27/21 1102  BP: (!) 140/94 (!) 136/107    Pulse: 92 88 90 84  Resp: 17 18 18 18   Temp: 99.2 F (37.3 C) 98.8 F (37.1 C)    TempSrc: Oral Oral    SpO2: 97% 99% 97% 95%  Weight:      Height:       Adult male, tracheostomy and PEG tube in place, eyes closed when I enter, opens eyes intermittently while I am in the room, I am uncertain if he is able to make eye contact, he makes no spontaneous verbalizations or movements, he does not follow commands. RRR, no murmurs, no peripheral edema Respiratory rate appears normal on his tracheostomy, lung sounds overall diminished, do not appreciate rales or wheezes Abdomen without grimace to palpation, no mass, no rigidity    Data Reviewed: Basic metabolic panel unremarkable CBC normal      Disposition: Status is: Inpatient         Author: , MD 09/27/2021 3:44 PM  For on call review [email or web address].

## 2021-09-27 NOTE — Progress Notes (Signed)
Patient seen today by trach team for consult.  No education is needed at this time.  All necessary equipment is at beside.   Will continue to follow for progression.  

## 2021-09-28 NOTE — Plan of Care (Signed)
  Problem: Safety: Goal: Ability to remain free from injury will improve Outcome: Progressing   Problem: Elimination: Goal: Will not experience complications related to urinary retention Outcome: Progressing   Problem: Skin Integrity: Goal: Risk for impaired skin integrity will decrease Outcome: Progressing

## 2021-09-28 NOTE — Progress Notes (Signed)
  Progress Note   Patient: Billy Cherry [id number] DOB: May 20, 1970 DOA: 03/30/2021     182 DOS: the patient was seen and examined on 09/28/2021 at 10:50 AM      Brief hospital course: Mr. Allnutt is a 51 y.o. M with out of hospital cardiac arrest, now with persistent vegetative state.        Assessment and Plan: * Cardiac arrest Anoxic encephalopathy Dysphagia Myoclonus No change - Consult trach team - Continue tube feeds, scopolamine, Imodium, PPI, Keppra, clonazepam, Depakote - Consult TOC    Pulmonary embolism - Continue Eliquis  Hypertension Chronic systolic congestive heart failure Blood pressure controlled and remains euvolemic.  EF less than 20% this hospitalization -Continue Coreg, hydralazine, Isordil, spironolactone        Subjective: No new fever, respiratory change, change in mentation.  No nursing concerns.     Physical Exam: Vitals:   09/28/21 0306 09/28/21 0349 09/28/21 0803 09/28/21 0849  BP:  115/79 (!) 158/91   Pulse: 96 93 88 84  Resp: 16 18 16 16   Temp:  (!) 97.5 F (36.4 C) 97.9 F (36.6 C)   TempSrc:  Oral Oral   SpO2: 94% 95% 100% 94%  Weight:      Height:       Adult male, unresponsive, tracheostomy and PEG tube in place, eyes open when I enter, does not make eye contact No spontaneous movements or verbalizations, does not follow commands RRR, no murmurs, no pitting peripheral edema Respiratory rate appears normal, lung sounds overall diminished, I cannot appreciate rales or wheezes Abdomen soft, no grimace to palpation, no masses or rigidity There is early contractures in all 4 extremities, he is flaccid      Data Reviewed: No new labs      Disposition: Status is: Inpatient         Author: , MD 09/28/2021 10:49 AM  For on call review [email or web address].

## 2021-09-29 NOTE — Progress Notes (Signed)
  Progress Note   Patient: Billy Cherry [id number] DOB: [birth year] DOA: 03/30/2021     183 DOS: the patient was seen and examined on 09/29/2021        Brief hospital course: Billy Cherry is a 51 y.o. M with out of hospital cardiac arrest, now with persistent vegetative state.        Assessment and Plan: * Cardiac arrest Anoxic encephalopathy Dysphagia Myoclonus No change - Consult trach team - Continue tube feeds, scopolamine, Imodium, PPI, Keppra, clonazepam, Depakote - Consult TOC    Pulmonary embolism - Continue Eliquis  Hypertension Chronic systolic congestive heart failure Blood pressure controlled and remains euvolemic.  EF less than 20% this hospitalization -Continue Coreg, hydralazine, Isordil, spironolactone        Subjective: No new nursing concerns.  No fever or respiratory distress.     Physical Exam: Vitals:   09/28/21 2115 09/29/21 0900 09/29/21 0924 09/29/21 1235  BP: (!) 145/94  (!) 141/98   Pulse: 86 77 81 92  Resp: 17 18 20 19   Temp: 98.5 F (36.9 C)  98.3 F (36.8 C)   TempSrc: Oral  Oral   SpO2: 99% 96% 95% 94%  Weight:      Height:       Adult male, unresponsive, with tracheostomy and PEG tube RRR, no murmurs, no peripheral edema Respiratory rate normal, lungs clear without rales or wheezes, lung sounds diminished Unresponsive, eyes closed, does not follow commands    Data Reviewed: No new labs      Disposition: Status is: Inpatient         Author: , MD 09/29/2021 2:48 PM  For on call review [email or web address].

## 2021-09-29 NOTE — Plan of Care (Signed)
  Problem: Skin Integrity: Goal: Risk for impaired skin integrity will decrease Outcome: Progressing   

## 2021-09-29 NOTE — Plan of Care (Signed)
  Problem: Nutrition: Goal: Adequate nutrition will be maintained Outcome: Not Progressing   Problem: Safety: Goal: Ability to remain free from injury will improve Outcome: Progressing   

## 2021-09-30 NOTE — Progress Notes (Signed)
  Progress Note   Patient: Billy Cherry [id number] DOB: August 30, 1970 DOA: 03/30/2021     184 DOS: the patient was seen and examined on 09/30/2021        Brief hospital course: Mr. Meador is a 51 y.o. M with out of hospital cardiac arrest, now with persistent vegetative state.        Assessment and Plan: * Cardiac arrest Anoxic encephalopathy Dysphagia Myoclonus No change - Consult trach team - Continue tube feeds, scopolamine, Imodium, PPI, Keppra, clonazepam, Depakote - Consult TOC    Pulmonary embolism -Continue Eliquis  Hypertension Chronic systolic congestive heart failure Blood pressure controlled and remains euvolemic.  EF less than 20% this hospitalization - Continue carvedilol, hydralazine, Isordil, spironolactone        Subjective: No new nursing concerns, no fever or respiratory distress.     Physical Exam: Vitals:   09/30/21 1210 09/30/21 1446 09/30/21 1541 09/30/21 1600  BP:  123/89  113/80  Pulse: 87 87 84 82  Resp: 18  17 18   Temp:    97.8 F (36.6 C)  TempSrc:    Axillary  SpO2: 97%  95% 96%  Weight:      Height:       Adult male, tracheostomy and PEG tube in place, unresponsive RRR, no murmurs, no pitting edema Respiratory rate appears normal in tracheostomy, lungs clear without rales or wheezes No grimace to palpation of the abdomen which is soft and nondistended Unresponsive, no spontaneous verbalizations or movements, eyes closed    Data Reviewed: No new labs      Disposition: Status is: Inpatient         Author: , MD 09/30/2021 4:22 PM  For on call review [email or web address].

## 2021-09-30 NOTE — Plan of Care (Signed)
  Problem: Clinical Measurements: Goal: Respiratory complications will improve Outcome: Progressing   

## 2021-10-01 NOTE — Progress Notes (Signed)
  Progress Note   Patient: Billy Cherry [id number] DOB: [birth year] DOA: 03/30/2021     185 DOS: the patient was seen and examined on 10/01/2021        Brief hospital course: Billy Cherry is a 51 y.o. M with out of hospital cardiac arrest, now with persistent vegetative state.        Assessment and Plan: * Cardiac arrest Anoxic encephalopathy Dysphagia Myoclonus No change - Consult trach team - Continue tube feeds, scopolamine, Imodium, PPI, Keppra, clonazepam, Depakote - Consult TOC    Pulmonary embolism -Continue Eliquis  Hypertension Chronic systolic congestive heart failure Blood pressure controlled and remains euvolemic.  EF less than 20% this hospitalization - Continue carvedilol, hydralazine, isosorbide dinitrate, spironolactone        Subjective: No new nursing concerns, no fever or respiratory distress    Physical Exam: Vitals:   10/01/21 0824 10/01/21 1101 10/01/21 1540 10/01/21 1601  BP:   106/87   Pulse: 84 90 87 88  Resp: 18 18 17 17   Temp:   98.1 F (36.7 C)   TempSrc:   Oral   SpO2: 94% 94% 99% 96%  Weight:      Height:       Adult male, tracheostomy and PEG tube in place, unresponsive RRR, no murmurs, no peripheral edema Respiratory rate appears normal, tracheostomy in place, lungs clear without rales or wheezes, overall diminished No grimace to palpation of the abdomen, soft and nondistended Unresponsive with no spontaneous verbalizations or movements, eyes closed     Data Reviewed: No new labs      Disposition: Status is: Inpatient         Author: , MD 10/01/2021 4:19 PM  For on call review [email or web address].

## 2021-10-02 NOTE — Progress Notes (Addendum)
CSW spoke with Jess Barters of [facility] who states the agency will begin services on 10/04/2021 at 11am.  CSW will arrange for patient to be discharged home early Wednesday morning.  CSW spoke with patient's spouse Retillas to inform her of discharge plan.   CSW spoke with Aurther Loft at [facility] to schedule transportation for the patient to return home - pickup scheduled for 9:30am on 10/04/2021.  Edwin Dada, MSW, LCSW Transitions of Care  Clinical Social Worker II [phone]

## 2021-10-02 NOTE — Progress Notes (Signed)
  Progress Note   Patient: Billy Cherry [id number] DOB: February 02, 1971 DOA: 03/30/2021     186 DOS: the patient was seen and examined on 10/02/2021        Brief hospital course: Billy Cherry is a 51 y.o. M with out of hospital cardiac arrest, now with persistent vegetative state.        Assessment and Plan: * Cardiac arrest Anoxic encephalopathy Dysphagia Myoclonus No change - Consult trach team - Continue tube feeds, scopolamine, Imodium, PPI, Keppra, Klonopin, Depakote - Consult TOC    Pulmonary embolism - Continue Eliquis  Hypertension Chronic systolic congestive heart failure Blood pressure controlled and remains euvolemic.  EF less than 20% this hospitalization - Continue carvedilol, hydralazine, Isordil, spironolactone        Subjective: No nursing concerns, no fever or respiratory distress    Physical Exam: Vitals:   10/02/21 0500 10/02/21 0518 10/02/21 0830 10/02/21 0908  BP:  (!) 127/91 132/76   Pulse:  88 91 89  Resp:  20  17  Temp:  98.5 F (36.9 C) 98.4 F (36.9 C)   TempSrc:  Oral Oral   SpO2:  96%  93%  Weight: 97 kg     Height:       Adult male, tracheostomy and PEG tube in place, unresponsive RRR, no murmurs, no peripheral edema Respiratory rate normal, tracheostomy in place, lungs clear without rales or wheezes, lung sounds diminished No grimace to palpation of the abdomen, soft nondistended Unresponsive with no spontaneous verbalizations or movements, eyes open, pupils equal      Data Reviewed: No new labs      Disposition: Status is: Inpatient         Author: Alberteen Sam, MD 10/02/2021 10:53 AM  For on call review [email or web address].

## 2021-10-02 NOTE — Progress Notes (Signed)
  NAME:  Billy Cherry, MRN:  [id number], DOB:  24-Apr-1970, LOS: 186 ADMISSION DATE:  03/30/2021, CONSULTATION DATE:  1/19 REFERRING MD:  Evette Georges, CHIEF COMPLAINT:  Found down, cardiac arrest   Brief Pt Description / Synopsis:  51 year old male with out-of-hospital V. fib cardiac arrest in setting of respiratory arrest due to suspected angioedema from ACE inhibitor leading to asphyxiation.  Concern on his 12 lead EKG for anterolateral ST elevation so he was taken to the cath lab where his study showed no significant coronary artery disease and an LVEF < 20%  Now with anoxic brain injury.  Pertinent  Medical History  Hypertension  Significant Hospital Events: Including procedures, antibiotic start and stop dates in addition to other pertinent events   1/19 admission, left heart cath without significant CAD.  Concern for anoxic brain injury 1/20: MRI pending, Neuro following 1/21: MRI shows areas of hypoxic-ischemic injury bilaterally. Fevered; cultures obtained. 1/23 severe brain damage 1/24 evidence of severe brain damage, remains critically ill 1/25: Plan for PEG today. Neuro exam unchanged.  Does exhibit new eyelid tremor like movements and lip/jaw tremor like vs clonus movement.  Bolused Keppra and increased maintenance dose.  Repeat EEG. CTH: concerning for progression of anoxic injury.  1/26: Repeat EEG unchanged and continues to show myoclonic seizures despite addition of Valproic acid yesterday. transfer to [facility] for continuous EEG 1/28: Continues to have myoclonic sz on EEG 1/29 No acute issues overnight 2/3 no meaningful neuro recovery. Diuresed 2/5 unchanged 2/6 New fever to 102, WBC 16K - treated with vancomycin for 7d based on cultures.  2/11: percutaneous tracheostomy 2/13 >2/20 on trach collar. No vent.  2/20 Trach change to 6 cuffless 2/27 status unchanged 3/13 unchanged 3/20 due for trach exchange given continue secretions (although improved) will stick to 6 cuffless  for exchange  06/05/2021 Continued thick  secretions, scant blood tinged 4/3/am 06/06/2021 CT of the chest with new PE and haziness consistent with pneumonia 5/30 continue tube feeding/free water  Interim History / Subjective:   Unresponsive, copious secretions from trach  Objective   Blood pressure 132/76, pulse 89, temperature 98.4 F (36.9 C), temperature source Oral, resp. rate 17, height 5\' 10"  (1.778 m), weight 97 kg, SpO2 93 %.    FiO2 (%):  [21 %] 21 %   Intake/Output Summary (Last 24 hours) at 10/02/2021 0932 Last data filed at 10/02/2021 10/04/2021 Gross per 24 hour  Intake --  Output 1450 ml  Net -1450 ml    Filed Weights   09/30/21 0500 10/01/21 0500 10/02/21 0500  Weight: 96 kg 97 kg 97 kg   Examination:  Comatose, NAD Trach in place, copious thin secretion Unresponsive to noxious stimuli Clear lung sounds, no wheezing Extremities with trace edema  No new imaging  Assessment & Plan:  Out of hospital V-fib arrest Postarrest anoxic brain injury Myoclonus Respiratory failure with prolonged mechanical ventilation Tracheostomy dependent Cardiomyopathy Pulmonary embolism Hypertension  Plan: Continue routine trach care PRN suctioning Not a candidate for decannulation due to mental status Continue atropine, glycopyrrolate and scopolamine patches for secretion control Continue chest PT Continue anticoagulation Overall poor prognosis  Pulmonary will see weekly for trach management. Please call 10/04/21 sooner if needed.  Yanelli Zapanta [place], MD [facility] 10/02/2021 9:32 AM

## 2021-10-03 NOTE — Progress Notes (Signed)
  Progress Note   Patient: Billy Cherry RRN:[id number] DOB: Jul 16, 1970 DOA: 03/30/2021     187 DOS: the patient was seen and examined on 10/03/2021        Brief hospital course: [name] is a 51 y.o. M with out of hospital cardiac arrest, now with persistent vegetative state.        Assessment and Plan:  Persistent vegetative state No change - Consult trach team, TOC - Continue tube feeds, scopolamine, Imodium, PPI, Keppra, Klonopin, Depakote  Pulmonary embolism - Continue Eliquis   Hypertension and chronic systolic congestive heart failure Blood pressure normal and remains euvolemic EF less than 20% this hospitalization - Continue carvedilol, hydralazine, Isordil, spironolactone         Subjective: No change, no nursing concerns, fever, vomiting, respiratory distress    Physical Exam: Vitals:   10/03/21 0414 10/03/21 0432 10/03/21 0731 10/03/21 0819  BP: (!) 142/109  104/80   Pulse: 85 85 78 74  Resp: 19 16 18 18   Temp:   97.8 F (36.6 C)   TempSrc:   Oral   SpO2: 98% 97% 94% 96%  Weight:    98 kg  Height:       Adult male, tracheostomy PEG tube in place, unresponsive RRR, no murmurs, no peripheral edema Respiratory rate normal, tracheostomy in place, lungs clear without rales or wheezes, lung sounds diminished No grimace to palpation of the abdomen, which is soft and nondistended Unresponsive with no spontaneous verbalizations or movements, eyes closed         Disposition: Status is: Inpatient Home services should be arranged by tomorrow, plan for discharge tomorrow 9:30 AM        Author: , MD 10/03/2021 11:23 AM  For on call review [email or web address].

## 2021-10-03 NOTE — Progress Notes (Signed)
CSW spoke with Marcelino Duster at [facility] to discuss the patient's discharge plan for tomorrow. Marcelino Duster requested additional clinicals be sent for review. CSW emphasized that this patient will be discharged home tomorrow at 9:30am. Clinicals were sent via fax for review.  CSW spoke with patient's spouse Retillas to remind her of discharge plan.  Edwin Dada, MSW, LCSW Transitions of Care  Clinical Social Worker II [phone]

## 2021-10-04 ENCOUNTER — Encounter (HOSPITAL_COMMUNITY): Payer: Self-pay

## 2021-10-04 ENCOUNTER — Inpatient Hospital Stay (HOSPITAL_COMMUNITY)
Admission: EM | Admit: 2021-10-04 | Discharge: 2021-10-24 | Disposition: A | Discharge status: Home Health | Payer: BC Managed Care – PPO | Source: Home / Self Care | Attending: Internal Medicine | Admitting: Internal Medicine

## 2021-10-04 ENCOUNTER — Other Ambulatory Visit (HOSPITAL_COMMUNITY): Payer: Self-pay

## 2021-10-04 DIAGNOSIS — R403 Persistent vegetative state: Secondary | ICD-10-CM | POA: Diagnosis present

## 2021-10-04 DIAGNOSIS — Z6831 Body mass index (BMI) 31.0-31.9, adult: Secondary | ICD-10-CM

## 2021-10-04 DIAGNOSIS — Z7401 Bed confinement status: Secondary | ICD-10-CM

## 2021-10-04 DIAGNOSIS — L89611 Pressure ulcer of right heel, stage 1: Secondary | ICD-10-CM | POA: Diagnosis present

## 2021-10-04 DIAGNOSIS — Z930 Tracheostomy status: Secondary | ICD-10-CM

## 2021-10-04 DIAGNOSIS — I110 Hypertensive heart disease with heart failure: Secondary | ICD-10-CM | POA: Diagnosis present

## 2021-10-04 DIAGNOSIS — L89312 Pressure ulcer of right buttock, stage 2: Secondary | ICD-10-CM | POA: Diagnosis present

## 2021-10-04 DIAGNOSIS — J9611 Chronic respiratory failure with hypoxia: Secondary | ICD-10-CM | POA: Diagnosis present

## 2021-10-04 DIAGNOSIS — Z7901 Long term (current) use of anticoagulants: Secondary | ICD-10-CM

## 2021-10-04 DIAGNOSIS — G253 Myoclonus: Secondary | ICD-10-CM | POA: Diagnosis present

## 2021-10-04 DIAGNOSIS — Z931 Gastrostomy status: Secondary | ICD-10-CM

## 2021-10-04 DIAGNOSIS — Z8674 Personal history of sudden cardiac arrest: Secondary | ICD-10-CM

## 2021-10-04 DIAGNOSIS — A419 Sepsis, unspecified organism: Secondary | ICD-10-CM | POA: Diagnosis present

## 2021-10-04 DIAGNOSIS — Z86718 Personal history of other venous thrombosis and embolism: Secondary | ICD-10-CM

## 2021-10-04 DIAGNOSIS — G931 Anoxic brain damage, not elsewhere classified: Secondary | ICD-10-CM | POA: Diagnosis present

## 2021-10-04 DIAGNOSIS — I2699 Other pulmonary embolism without acute cor pulmonale: Secondary | ICD-10-CM | POA: Diagnosis present

## 2021-10-04 DIAGNOSIS — I213 ST elevation (STEMI) myocardial infarction of unspecified site: Secondary | ICD-10-CM | POA: Diagnosis present

## 2021-10-04 DIAGNOSIS — Z8701 Personal history of pneumonia (recurrent): Secondary | ICD-10-CM

## 2021-10-04 DIAGNOSIS — I10 Essential (primary) hypertension: Secondary | ICD-10-CM | POA: Diagnosis present

## 2021-10-04 DIAGNOSIS — I82409 Acute embolism and thrombosis of unspecified deep veins of unspecified lower extremity: Secondary | ICD-10-CM | POA: Diagnosis present

## 2021-10-04 DIAGNOSIS — Z515 Encounter for palliative care: Secondary | ICD-10-CM

## 2021-10-04 DIAGNOSIS — R1310 Dysphagia, unspecified: Secondary | ICD-10-CM | POA: Diagnosis present

## 2021-10-04 DIAGNOSIS — L89302 Pressure ulcer of unspecified buttock, stage 2: Secondary | ICD-10-CM | POA: Diagnosis present

## 2021-10-04 DIAGNOSIS — Z79899 Other long term (current) drug therapy: Secondary | ICD-10-CM

## 2021-10-04 DIAGNOSIS — E669 Obesity, unspecified: Secondary | ICD-10-CM | POA: Diagnosis present

## 2021-10-04 DIAGNOSIS — Z7689 Persons encountering health services in other specified circumstances: Principal | ICD-10-CM

## 2021-10-04 DIAGNOSIS — I2782 Chronic pulmonary embolism: Secondary | ICD-10-CM | POA: Diagnosis present

## 2021-10-04 DIAGNOSIS — Z888 Allergy status to other drugs, medicaments and biological substances status: Secondary | ICD-10-CM

## 2021-10-04 DIAGNOSIS — I5022 Chronic systolic (congestive) heart failure: Secondary | ICD-10-CM | POA: Diagnosis present

## 2021-10-04 MED ORDER — NUTRISOURCE FIBER PO PACK
1.0000 | PACK | Freq: Two times a day (BID) | ORAL | Status: DC
  Administered 2021-10-05 – 2021-10-24 (×39): 1
  Filled 2021-10-04 (×40): qty 1

## 2021-10-04 MED ORDER — ONDANSETRON HCL 4 MG/2ML IJ SOLN
4.0000 mg | Freq: Four times a day (QID) | INTRAMUSCULAR | Status: DC | PRN
  Administered 2021-10-06: 4 mg via INTRAVENOUS
  Filled 2021-10-04: qty 2

## 2021-10-04 MED ORDER — LEVETIRACETAM 100 MG/ML PO SOLN
750.0000 mg | Freq: Two times a day (BID) | ORAL | Status: DC
  Administered 2021-10-04 – 2021-10-24 (×40): 750 mg
  Filled 2021-10-04 (×41): qty 10

## 2021-10-04 MED ORDER — SCOPOLAMINE 1 MG/3DAYS TD PT72
1.0000 | MEDICATED_PATCH | TRANSDERMAL | Status: DC
  Administered 2021-10-05 – 2021-10-23 (×7): 1.5 mg via TRANSDERMAL
  Filled 2021-10-04 (×7): qty 1

## 2021-10-04 MED ORDER — FREE WATER
200.0000 mL | Freq: Four times a day (QID) | Status: DC
Start: 2021-10-05 — End: 2021-10-24
  Administered 2021-10-05 – 2021-10-24 (×79): 200 mL

## 2021-10-04 MED ORDER — HYDROMORPHONE HCL 1 MG/ML IJ SOLN: 1.0000 mg | Freq: Once | INTRAMUSCULAR | Status: DC | PRN

## 2021-10-04 MED ORDER — HYDRALAZINE HCL 50 MG PO TABS
50.0000 mg | ORAL_TABLET | Freq: Three times a day (TID) | ORAL | Status: DC
Start: 2021-10-04 — End: 2021-10-24
  Administered 2021-10-05 – 2021-10-24 (×58): 50 mg
  Filled 2021-10-04 (×2): qty 1
  Filled 2021-10-04: qty 2
  Filled 2021-10-04 (×5): qty 1
  Filled 2021-10-04: qty 2
  Filled 2021-10-04 (×51): qty 1

## 2021-10-04 MED ORDER — APIXABAN 5 MG PO TABS
5.0000 mg | ORAL_TABLET | Freq: Two times a day (BID) | ORAL | Status: DC
  Administered 2021-10-05 – 2021-10-24 (×40): 5 mg
  Filled 2021-10-04 (×41): qty 1

## 2021-10-04 MED ORDER — ONDANSETRON HCL 4 MG PO TABS: 4.0000 mg | ORAL_TABLET | Freq: Four times a day (QID) | ORAL | Status: DC | PRN

## 2021-10-04 MED ORDER — SODIUM CHLORIDE 0.9% FLUSH
3.0000 mL | Freq: Two times a day (BID) | INTRAVENOUS | Status: DC
  Administered 2021-10-05 – 2021-10-14 (×20): 3 mL via INTRAVENOUS

## 2021-10-04 MED ORDER — PANTOPRAZOLE 2 MG/ML SUSPENSION
40.0000 mg | Freq: Every day | ORAL | Status: DC
  Administered 2021-10-04 – 2021-10-23 (×20): 40 mg
  Filled 2021-10-04 (×22): qty 20

## 2021-10-04 MED ORDER — PROSOURCE TF PO LIQD
45.0000 mL | Freq: Three times a day (TID) | ORAL | Status: DC
Start: 2021-10-04 — End: 2021-10-11
  Administered 2021-10-04 – 2021-10-10 (×19): 45 mL
  Filled 2021-10-04 (×21): qty 45

## 2021-10-04 MED ORDER — CARVEDILOL 25 MG PO TABS
25.0000 mg | ORAL_TABLET | Freq: Two times a day (BID) | ORAL | Status: DC
  Administered 2021-10-05 – 2021-10-24 (×39): 25 mg
  Filled 2021-10-04 (×13): qty 1
  Filled 2021-10-04: qty 2
  Filled 2021-10-04 (×26): qty 1

## 2021-10-04 MED ORDER — PANTOPRAZOLE SODIUM 40 MG PO PACK
40.0000 mg | PACK | Freq: Every day | ORAL | Status: DC
Start: 2021-10-04 — End: 2021-10-04

## 2021-10-04 MED ORDER — POLYETHYLENE GLYCOL 3350 17 GM/SCOOP PO POWD: 17.0000 g | Freq: Every day | ORAL | Status: DC | PRN

## 2021-10-04 MED ORDER — ACETAMINOPHEN 325 MG PO TABS: 650.0000 mg | ORAL_TABLET | Freq: Four times a day (QID) | ORAL | Status: DC | PRN

## 2021-10-04 MED ORDER — VALPROIC ACID 250 MG/5ML PO SOLN
750.0000 mg | Freq: Three times a day (TID) | ORAL | Status: DC
  Administered 2021-10-04 – 2021-10-24 (×59): 750 mg
  Filled 2021-10-04 (×64): qty 15

## 2021-10-04 MED ORDER — GLYCOPYRROLATE 1 MG PO TABS
1.0000 mg | ORAL_TABLET | Freq: Three times a day (TID) | ORAL | Status: DC
Start: 2021-10-04 — End: 2021-10-24
  Administered 2021-10-04 – 2021-10-24 (×59): 1 mg
  Filled 2021-10-04 (×62): qty 1

## 2021-10-04 MED ORDER — DOXAZOSIN MESYLATE 2 MG PO TABS
2.0000 mg | ORAL_TABLET | Freq: Every day | ORAL | Status: DC
  Administered 2021-10-05 – 2021-10-24 (×20): 2 mg
  Filled 2021-10-04 (×20): qty 1

## 2021-10-04 MED ORDER — SPIRONOLACTONE 25 MG PO TABS
25.0000 mg | ORAL_TABLET | Freq: Every day | ORAL | Status: DC
  Administered 2021-10-05 – 2021-10-24 (×20): 25 mg
  Filled 2021-10-04 (×21): qty 1

## 2021-10-04 MED ORDER — CLONAZEPAM 0.5 MG PO TABS
0.5000 mg | ORAL_TABLET | Freq: Two times a day (BID) | ORAL | Status: DC
Start: 2021-10-04 — End: 2021-10-24
  Administered 2021-10-04 – 2021-10-24 (×40): 0.5 mg
  Filled 2021-10-04 (×40): qty 1

## 2021-10-04 MED ORDER — ISOSORBIDE DINITRATE 30 MG PO TABS
30.0000 mg | ORAL_TABLET | Freq: Three times a day (TID) | ORAL | Status: DC
  Administered 2021-10-04 – 2021-10-24 (×58): 30 mg
  Filled 2021-10-04 (×3): qty 3
  Filled 2021-10-04: qty 1
  Filled 2021-10-04 (×6): qty 3
  Filled 2021-10-04 (×2): qty 1
  Filled 2021-10-04 (×19): qty 3
  Filled 2021-10-04: qty 1
  Filled 2021-10-04 (×15): qty 3
  Filled 2021-10-04: qty 1
  Filled 2021-10-04 (×18): qty 3
  Filled 2021-10-04: qty 1
  Filled 2021-10-04 (×3): qty 3

## 2021-10-04 MED ORDER — ORAL CARE MOUTH RINSE
15.0000 mL | OROMUCOSAL | Status: DC
Start: 2021-10-04 — End: 2021-10-24
  Administered 2021-10-05 – 2021-10-24 (×113): 15 mL via OROMUCOSAL

## 2021-10-04 MED ORDER — OSMOLITE 1.5 CAL PO LIQD: 1000.0000 mL | ORAL | 0 refills | Status: DC

## 2021-10-04 NOTE — Hospital Course (Signed)
Billy Cherry is a 51 y.o. 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

## 2021-10-04 NOTE — Discharge Summary (Signed)
Physician Discharge Summary  Billy Cherry QIH:[id number] DOB: August 26, 1970 DOA: 03/30/2021  PCP: Patient, No Pcp Per  Admit date: 03/30/2021 Discharge date: 10/04/2021  Admitted From: Home Disposition:  Home  Discharge Condition:Stable CODE STATUS:FULL Diet recommendation: Tube feeding  Brief/Interim Summary: Billy Cherry is a 51 y.o. M with HTN who presented after being found unresponsive with agonal respirations by his wife.    EMS found him at home with ventricular fibrillation cardiac arrest and respiratory arrest, gave defibrillation x1 -> asystole -> ROSC by arrival to ER after epinephrine.  STEMI noted on ECG. Admitted to [facility] initially, emergent LHC showed normal coronaries, EF 40-45%.  Had minimal neurological recovery after ROSC, developed myoclonus and was transferred to [facility] for Neurology consultation. MRI showed hypoxic-ischemic injury, EEG showed myoclonic seizures.  Patient was followed by neurology, PCCM, palliative care during this hospitalization.  Hospital course remarkable for persistent encephalopathy secondary to anoxic brain injury from cardiac arrest.  Hospital course also remarkable for aspiration pneumonia, treated with antibiotics.  Patient underwent trach/placement.  Trach changed to a 6 cuffless.  Patient is being discharged to home today with home health services.  Medically stable for discharge.  Following problems were addressed during his hospitalization:   Cardiac arrest ([place]) Suspect primary VF arrest.  There is speculation about ACEi-induced angioedema.  No EMS or admitting providers noted angioedema at admission.    Complicated by anoxic encephalopathy and persistent vegetative state.  LHC without significant disease, EF 40-45%.  Follow up echocardiogram with LV systolic function <87%, global hypokinesis.  No significant valvular disease.    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  Neurology consulted.  Developed myoclonic  seizure activity quickly after arrest, which was difficult to manage.  Remains confused.   Acute respiratory failure with hypoxia ([name]) Agonal breathing requiring intubation at admission.  Unable to wean from ventilator until after tracheostomy placed.  Currently on 6 cuffless.  Needs to follow-up with trach clinic as an outpatient.  Not a candidate for decannulation due to mental status.   HAP (hospital-acquired pneumonia) Respiratory culture in Feb 2023 growing MSSA and Flaviobacterium.  Treated with 7 days antibiotic course.  Respiratory culture in March growing Pseudomonas, completed 7 days [place].   Treated third time in April with Unasyn.  Has had intermittent fever in May and June, likely microaspiration, no further antibiotics needed.   Pulmonary embolus ([name]) Noted to have possible acute on chronic pulmonary embolism on CT.  Also have some evidence of possible pulmonary hypertension.  On Eliquis   DVT (deep venous thrombosis) ([name]) Duplex ultrasound of lower extremity on 05/15/2021 showed age indeterminate deep vein thrombosis involving the left femoral vein, left proximal profunda vein, left posterior tibial veins, left popliteal vein, and left peroneal veins. Started on new Eliquis.   Dysphagia Due to anoxic brain injury and persistent vegetative state.  Continue tube feeding through PEG   Pressure injury of skin Stage I to right heel. Now resolved.     Chronic systolic CHF (congestive heart failure) ([name]) 2D echocardiogram showed LVEF < 20%, global hypokinesis.  Started on GDMT.   Follow-up with cardiology as an outpatient   Myoclonus Seen by neurology.  Started on AE therapy with Klonopin, Keppra, valproic acid.    Obesity (BMI 30-39.9) BMI 31  Goals of care: Extremely poor quality of life due to persistent encephalopathy secondary to anoxic brain injury from cardiac arrest.  Extensive goals of care discussed with the family.  Palliative care was involved.  Continues  to  remain full code.  We recommend to follow-up with palliative care as an outpatient.   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[place])   Acute respiratory failure with hypoxia ([name])   HAP (hospital-acquired pneumonia)   DVT (deep venous thrombosis) ([name])   Pulmonary embolus ([name])   Dysphagia   HTN (hypertension)   Pressure injury of skin   Chronic systolic CHF (congestive heart failure) ([name])   Myoclonus   Obesity (BMI 30-39.9)    Discharge Instructions  Discharge Instructions     Discharge instructions   Complete by: As directed    1)Follow-up with primary care provider  in a week  2)Do a CBC and BMP test in a week 3)Tracheostomy and PEG tube to be managed by patient's new outpatient home care agency; if questions arise, referral to [facility] (for the [name]) could be arranged   Discharge wound care:   Complete by: As directed    Place foam dressing over the right heel and right plantar foot and assess daily. Change foam dressing every 3 days.      Allergies as of 10/04/2021       Reactions   Lisinopril Swelling   angioedema        Medication List     STOP taking these medications    Chlorhexidine Gluconate Cloth 2 % Pads   clevidipine 0.5 MG/ML Emul Commonly known as: CLEVIPREX   dextrose 5 % solution   docusate 50 MG/5ML liquid Commonly known as: COLACE   enoxaparin 60 MG/0.6ML injection Commonly known as: LOVENOX   HYDROmorphone 1 MG/ML injection Commonly known as: DILAUDID   insulin aspart 100 UNIT/ML injection Commonly known as: novoLOG   labetalol 5 MG/ML injection Commonly known as: NORMODYNE   levETIRAcetam 750 mg in sodium chloride 0.9 % 100 mL   ondansetron 4 MG/2ML Soln injection Commonly known as: ZOFRAN   polyethylene glycol 17 g packet Commonly known as: MIRALAX / GLYCOLAX Replaced by: polyethylene glycol powder 17 GM/SCOOP powder   propofol 1000  MG/100ML Emul injection Commonly known as: DIPRIVAN   sodium chloride 0.9 % infusion   sodium chloride flush 0.9 % Soln Commonly known as: NS       TAKE these medications    acetaminophen 325 MG tablet Commonly known as: TYLENOL Place 2 tablets (650 mg total) into feeding tube every 6 (six) hours as needed for mild pain or fever.   apixaban 5 MG Tabs tablet Commonly known as: ELIQUIS Place 1 tablet (5 mg total) into feeding tube 2 (two) times daily. Notes to patient: Take tonight @ bedtime 08/07/21   carvedilol 25 MG tablet Commonly known as: COREG Place 1 tablet (25 mg total) into feeding tube 2 (two) times daily with a meal. Notes to patient: Take tomorrow morning 08/08/21   chlorhexidine gluconate (MEDLINE KIT) 0.12 % solution Commonly known as: PERIDEX 15 mLs by Mouth Rinse route 2 (two) times daily. Notes to patient: Take tonight @ bedtime 08/07/21   clonazePAM 0.5 MG tablet Commonly known as: KLONOPIN Place 1 tablet (0.5 mg total) into feeding tube 2 (two) times daily. Notes to patient: Take tonight @ bedtime 08/07/21   doxazosin 2 MG tablet Commonly known as: CARDURA Place 1 tablet (2 mg total) into feeding tube daily. Notes to patient: Take tomorrow morning 08/08/21   feeding supplement (PROSource TF) liquid Place 45 mLs into feeding tube 3 (three) times daily. What changed:  how  much to take when to take this   feeding supplement (OSMOLITE 1.5 CAL) Liqd Place 1,000 mLs into feeding tube continuous. What changed:  how much to take when to take this   fiber Pack packet Place 1 packet into feeding tube 2 (two) times daily.   free water Soln Place 200 mLs into feeding tube every 6 (six) hours. What changed:  how much to take when to take this   glycopyrrolate 1 MG tablet Commonly known as: ROBINUL Place 1 tablet (1 mg total) into feeding tube 3 (three) times daily. Notes to patient: Take this evening 08/07/21   hydrALAZINE 50 MG tablet Commonly known as:  APRESOLINE Place 1 tablet (50 mg total) into feeding tube 3 (three) times daily. What changed:  medication strength how much to take when to take this Another medication with the same name was removed. Continue taking this medication, and follow the directions you see here. Notes to patient: Take tonight @ bedtime 08/07/21   isosorbide dinitrate 30 MG tablet Commonly known as: ISORDIL Place 1 tablet (30 mg total) into feeding tube 3 (three) times daily. What changed:  medication strength how much to take Notes to patient: Take this evening 6/523   levETIRAcetam 100 MG/ML solution Commonly known as: KEPPRA Place 7.5 mLs (750 mg total) into feeding tube 2 (two) times daily. Notes to patient: Take tonight @ bedtime 08/07/21   Loperamide HCl 1 MG/7.5ML Liqd Place 15 mLs (2 mg total) into feeding tube daily as needed for diarrhea or loose stools.   mouth rinse Liqd solution 15 mLs by Mouth Rinse route every 4 (four) hours.   pantoprazole sodium 40 mg Commonly known as: PROTONIX Place 40 mg into feeding tube at bedtime. What changed: when to take this Notes to patient: Take tonight @ bedtime 08/07/21   polyethylene glycol powder 17 GM/SCOOP powder Commonly known as: GLYCOLAX/MIRALAX Place 1 capful  (17 g) into feeding tube daily as needed for mild constipation or moderate constipation. Replaces: polyethylene glycol 17 g packet   scopolamine 1 MG/3DAYS Commonly known as: TRANSDERM-SCOP Place 1 patch (1.5 mg total) onto the skin every 3 (three) days. Notes to patient: 08/10/21  In am   spironolactone 25 MG tablet Commonly known as: ALDACTONE Place 1 tablet (25 mg total) into feeding tube daily. Notes to patient: Take tomorrow morning 08/08/21   valproic acid 250 MG/5ML solution Commonly known as: DEPAKENE Place 15 mLs (750 mg total) into feeding tube 3 (three) times daily. What changed: how much to take Notes to patient: Take this evening 08/07/21       ASK your doctor about  these medications    feeding supplement (OSMOLITE 1.5 CAL) Liqd Place 355 mLs into feeding tube 4 (four) times daily. Ask about: Should I take this medication?   feeding supplement (PROSource TF) liquid Place 45 mLs into feeding tube 2 (two) times daily for 3 days. Ask about: Should I take this medication?   nutrition supplement (JUVEN) Pack Place 1 packet into feeding tube 2 (two) times daily between meals for 3 days. Ask about: Should I take this medication?               Durable Medical Equipment  (From admission, onward)           Start     Ordered   09/20/21 1310  For home use only DME Tube feeding  Once       Comments: Transition to continuous TF via PEG:  Osmolite 1.5 @ 60 ml/hr (1440 ml/day) 45 ml ProSource TF BID   Provides: 2240 kcal, 112 grams protein, 1094 ml free water.    200 ml free water every 6 hours Total free water: 1894   D/C : nutrisource fiber and Juven     09/20/21 1315   09/20/21 1252  For home use only DME Trach supplies  (For Home Use Only DME Trach Supplies)  Once       Question Answer Comment  Trach Type Shiley   Back Up Trach Type Shiley   Cuffed or Uncuffed Uncuffed   Fenestrated No   Size 6 mm   XLT Length No   Trach Supplies/Equipment for Home Use Tube Holder/Collar   Trach Supplies/Equipment for Home Use Tracheostomy Drain Dressings   Trach Supplies/Equipment for Home Use Medical Suction Machine   Trach Supplies/Equipment for Home Use Suction Catheters   Trach Supplies/Equipment for Home Use Tracheostomy Care Cleaning Kits   Trach Supplies/Equipment for Home Use HME Device   Trach Supplies/Equipment for Home Use Humidification (oxygen 5 liters via trach collar to provide specified % - if tracheostomy is capped with occlusive cap during day, a separate [place] order will need to be provided)   Suction Catheter Size 14 French (for size 6 or higher)   Cleaning Kits 60   Oxygen % Other (see comment) 5 L/min     09/20/21 1252    09/20/21 1250  For home use only DME Tube feeding pump  Once       Question:  Length of Need  Answer:  Lifetime   09/20/21 1249   09/20/21 1250  For home use only DME oxygen  Once       Question Answer Comment  Length of Need 12 Months   Mode or (Route) Mask   Liters per Minute 5   Frequency Continuous (stationary and portable oxygen unit needed)   Oxygen conserving device Yes   Oxygen delivery system Gas      09/20/21 1250   09/20/21 1249  For home use only DME Other see comment  Once       Comments: Please deliver APP Mattress chuck pads, diaper wipes, open briefs XL size  Question:  Length of Need  Answer:  Lifetime   09/20/21 1249   09/20/21 1248  For home use only DME Hospital bed  Once       Question Answer Comment  Length of Need Lifetime   The above medical condition requires: Patient requires the ability to reposition frequently   Head must be elevated greater than: 30 degrees   Bed type Semi-electric   Hoyer Lift Yes   Support Surface: Alternating Pressure Pad and Pump      09/20/21 1247   06/27/21 1648  For home use only DME oxygen  Once       Question Answer Comment  Length of Need 12 Months   Mode or (Route) Nasal cannula   Liters per Minute 5   Frequency Continuous (stationary and portable oxygen unit needed)   Oxygen conserving device Yes   Oxygen delivery system Gas      06/27/21 1647              Discharge Care Instructions  (From admission, onward)           Start     Ordered   08/07/21 0000  Discharge wound care:       Comments: Place foam dressing over the right heel and  right plantar foot and assess daily. Change foam dressing every 3 days.   08/07/21 1245            Contact information for follow-up providers     [facility]. Call.   Specialty: Family Medicine Why: Please call to schedule initial home visit within 7-10 days after patient's discharge home from the hospital. Contact information: [place] [address] [place], [place] [facility] Follow up.   Why: [employer] will be providing home equipment including hospital bed, APP mattress, incontinence products, tracheostomy supplies, oxygen supplies, suction equipment and tube (bolus feed supplies). Contact information: [address] [place] [address] [place] Follow up.   Why: [employer] will be providing private duty nursing for home health services. Contact information: [place]  [address] [phone]         [facility] Follow up.   Why: [facility] will be providing private duty nursing coverage for some PM shifts and weekend shifts as arranged with the patient's wife. Contact information: [phone] ([place] office #)             Contact information for after-discharge care     Destination     [facility] Preferred SNF .   Service: Skilled Nursing Contact information: [address] [place] [address] [phone]                    Allergies  Allergen Reactions   Lisinopril Swelling    angioedema    Consultations: PCCM, neurology, palliative care  Procedures/Studies: ECHOCARDIOGRAM LIMITED  Result Date: 09/17/2021    ECHOCARDIOGRAM LIMITED REPORT   Patient Name:   Billy Cherry Date of Exam: 09/17/2021 Medical Rec #:  [id number]       Height:       70.0 in Accession #:    [id number]      Weight:       213.8 lb Date of Birth:  [birth year]       BSA:          2.148 m Patient Age:    71 years        BP:           146/96 mmHg Patient Gender: M               HR:           83 bpm. Exam Location:  Inpatient Procedure: Limited Echo Indications:    Cardiac arrest  History:        Patient has prior history of Echocardiogram examinations, most                 recent 03/23/2021. CHF; Risk  Factors:Hypertension. Respiratory                 failure requiring mechanical ventilation.  Sonographer:    Merrie Roof RDCS Referring Phys: A CALDWELL [place]  1. Limited study due to poor echo windows.  2. Left ventricular ejection fraction, by estimation, is 30 to 35%. The left ventricle has moderately decreased function. There is mild-to-moderate concentric left ventricular hypertrophy.  3. Right ventricular systolic function is normal. The right ventricular size is normal.  4. A small pericardial effusion is present. The pericardial effusion is posterior and lateral to the left  ventricle.  5. The mitral valve is abnormal. Comparison(s): Compared to prior TTE on 03/2021, the LVEF appears improved to ~30% from <20%. There is now a small pericardial effusion present. FINDINGS  Left Ventricle: Left ventricular ejection fraction, by estimation, is 30 to 35%. The left ventricle has moderately decreased function. There is mild-to-moderate concentric left ventricular hypertrophy. Right Ventricle: The right ventricular size is normal. Right ventricular systolic function is normal. Pericardium: A small pericardial effusion is present. The pericardial effusion is posterior and lateral to the left ventricle. Mitral Valve: The mitral valve is abnormal. There is mild thickening of the mitral valve leaflet(s). There is mild calcification of the mitral valve leaflet(s). Mild to moderate mitral annular calcification. Tricuspid Valve: The tricuspid valve is normal in structure. LEFT VENTRICLE PLAX 2D LVIDd:         4.60 cm LV PW:         1.30 cm LV IVS:        1.50 cm                         3D Volume EF:                        3D EF:        46 %                        LV EDV:       204 ml                        LV ESV:       111 ml                        LV SV:        93 ml Gwyndolyn Kaufman MD Electronically signed by Gwyndolyn Kaufman MD Signature Date/Time: 09/17/2021/4:06:02 PM    Final    DG CHEST PORT 1  VIEW  Result Date: 09/04/2021 CLINICAL DATA:  Hypoxia EXAM: PORTABLE CHEST 1 VIEW COMPARISON:  08/22/2021 FINDINGS: Cardiomegaly with left ventricular prominence. Tracheostomy in place. Mild patchy density at the lung bases could be atelectasis or mild patchy basilar pneumonia. No lobar consolidation or collapse. No effusion. No abnormal bone finding. IMPRESSION: Cardiomegaly. Tracheostomy in place. Mild patchy density at both lung bases could be atelectasis or mild patchy basilar pneumonia. Similar appearance to the study 2 weeks ago. Electronically Signed   By: Nelson Chimes M.D.   On: 09/04/2021 15:30      Subjective: Patient seen and examined at the bedside this morning.  Hemodynamically stable.  Looks comfortable.  On oxygen via tracheostomy.  Does not look in any Distress.  Remains encephalopathic  Discharge Exam: Vitals:   10/04/21 0816 10/04/21 0820  BP:  114/86  Pulse: 87 88  Resp: 18 18  Temp:  97.9 F (36.6 C)  SpO2: 96% 98%   Vitals:   10/04/21 0351 10/04/21 0627 10/04/21 0816 10/04/21 0820  BP:  110/82  114/86  Pulse: 80 87 87 88  Resp: $Remo'17 17 18 18  'hfgQn$ Temp:  98.6 F (37 C)  97.9 F (36.6 C)  TempSrc:  Oral  Oral  SpO2: 96% 97% 96% 98%  Weight:      Height:        General: Pt is not in acute distress, encephalopathy,tach,obese Cardiovascular: RRR, S1/S2 +,  no rubs, no gallops Respiratory: CTA bilaterally, no wheezing, no rhonchi Abdominal: Soft, NT, ND, bowel sounds +,PEG Extremities: no edema, no cyanosis    The results of significant diagnostics from this hospitalization (including imaging, microbiology, ancillary and laboratory) are listed below for reference.     Microbiology: No results found for this or any previous visit (from the past 240 hour(s)).   Labs: BNP (last 3 results) Recent Labs    06/06/21 0625  BNP 96.4   Basic Metabolic Panel: No results for input(s): "NA", "K", "CL", "CO2", "GLUCOSE", "BUN", "CREATININE", "CALCIUM", "MG", "PHOS" in  the last 168 hours. Liver Function Tests: No results for input(s): "AST", "ALT", "ALKPHOS", "BILITOT", "PROT", "ALBUMIN" in the last 168 hours. No results for input(s): "LIPASE", "AMYLASE" in the last 168 hours. No results for input(s): "AMMONIA" in the last 168 hours. CBC: No results for input(s): "WBC", "NEUTROABS", "HGB", "HCT", "MCV", "PLT" in the last 168 hours. Cardiac Enzymes: No results for input(s): "CKTOTAL", "CKMB", "CKMBINDEX", "TROPONINI" in the last 168 hours. BNP: Invalid input(s): "POCBNP" CBG: No results for input(s): "GLUCAP" in the last 168 hours. D-Dimer No results for input(s): "DDIMER" in the last 72 hours. Hgb A1c No results for input(s): "HGBA1C" in the last 72 hours. Lipid Profile No results for input(s): "CHOL", "HDL", "LDLCALC", "TRIG", "CHOLHDL", "LDLDIRECT" in the last 72 hours. Thyroid function studies No results for input(s): "TSH", "T4TOTAL", "T3FREE", "THYROIDAB" in the last 72 hours.  Invalid input(s): "FREET3" Anemia work up No results for input(s): "VITAMINB12", "FOLATE", "FERRITIN", "TIBC", "IRON", "RETICCTPCT" in the last 72 hours. Urinalysis    Component Value Date/Time   COLORURINE YELLOW 08/28/2021 0352   APPEARANCEUR CLEAR 08/28/2021 0352   LABSPEC 1.020 08/28/2021 0352   PHURINE 6.0 08/28/2021 0352   GLUCOSEU NEGATIVE 08/28/2021 0352   HGBUR NEGATIVE 08/28/2021 0352   BILIRUBINUR NEGATIVE 08/28/2021 0352   KETONESUR 5 (A) 08/28/2021 0352   PROTEINUR NEGATIVE 08/28/2021 0352   NITRITE NEGATIVE 08/28/2021 0352   LEUKOCYTESUR NEGATIVE 08/28/2021 0352   Sepsis Labs No results for input(s): "WBC" in the last 168 hours.  Invalid input(s): "PROCALCITONIN", "LACTICIDVEN" Microbiology No results found for this or any previous visit (from the past 240 hour(s)).  Please note: You were cared for by a hospitalist during your hospital stay. Once you are discharged, your primary care physician will handle any further medical issues. Please  note that NO REFILLS for any discharge medications will be authorized once you are discharged, as it is imperative that you return to your primary care physician (or establish a relationship with a primary care physician if you do not have one) for your post hospital discharge needs so that they can reassess your need for medications and monitor your lab values.    Time coordinating discharge: 40 minutes  SIGNED:   Shelly Coss, MD  [facility] 10/04/2021, 8:46 AM Pager [phone]  If 7PM-7AM, please contact night-coverage [email or web address] Password [email or web address]

## 2021-10-04 NOTE — Assessment & Plan Note (Addendum)
Most recent EF 30-35% by TTE 09/17/2021. -Continue Coreg, hydralazine, Isordil, spironolactone -Strict I/O's and daily weights

## 2021-10-04 NOTE — Assessment & Plan Note (Signed)
PEG dependent.  Continue meds and tube feeding through PEG.

## 2021-10-04 NOTE — Assessment & Plan Note (Signed)
Trach dependent, saturating well on 5 L O2 via trach collar.

## 2021-10-04 NOTE — H&P (Addendum)
History and Physical    Billy Cherry VEL:[id number] DOB: 1970/06/15 DOA: 10/04/2021  PCP: Patient, No Pcp Per  Patient coming from: Home  I have personally briefly reviewed patient's old medical records in [place]  Chief Complaint: Lack of home care  HPI: Billy Cherry is a 51 y.o. male with medical history significant for recent prolonged hospital stay due to V-fib cardiac arrest and respiratory arrest resulting in anoxic brain injury with persistent vegetative state, s/p trach and PEG, HFrEF (EF 30-35%), PE/DVT on Eliquis, myoclonic seizures who presented to the ED after same-day discharge due to no home health services and spouse inability to care for patient.  Patient is unable to provide any history which is otherwise obtained from EDP and chart review, unable to reach spouse by phone.  Patient discharged to home today (10/04/2021) after prolonged hospitalization with initial admission on 03/30/2021.  Hospital course as per discharge summary from earlier today:  "Billy Cherry is a 51 y.o. M with HTN who presented after being found unresponsive with agonal respirations by his wife. EMS found him at home with ventricular fibrillation cardiac arrest and respiratory arrest, gave defibrillation x1 -> asystole -> ROSC by arrival to ER after epinephrine.  STEMI noted on ECG.  Admitted to [facility] initially, emergent LHC showed normal coronaries, EF 40-45%.  Had minimal neurological recovery after ROSC, developed myoclonus and was transferred to [facility] for Neurology consultation. MRI showed hypoxic-ischemic injury, EEG showed myoclonic seizures.  Patient was followed by neurology, PCCM, palliative care during this hospitalization.  Hospital course remarkable for persistent encephalopathy secondary to anoxic brain injury from cardiac arrest.  Hospital course also remarkable for aspiration pneumonia, treated with antibiotics.  Patient underwent trach/placement.  Trach changed to a 6 cuffless.  Patient  is being discharged to home today with home health services.  Medically stable for discharge."  Per ED documentation, after patient was discharged to home earlier today he was sent back to the ED as spouse felt she could not adequately care for patient due to lack of knowledge on feeding tube, trach, etc.  Home health nurses have not yet arrived to help care for patient at home.  ED Course  Labs/Imaging on admission: I have personally reviewed following labs and imaging studies.  Initial vitals showed BP 138/103, pulse 87, RR 20, temp 98.5 F, SPO2 97% on 5 L O2 via trach collar.  TOC were consulted, home health services unable to be arranged therefore the hospitalist service was consulted to readmit pending disposition.  Review of Systems: All systems reviewed and are negative except as documented in history of present illness above.   Past Medical History:  Diagnosis Date   Hypertension     Past Surgical History:  Procedure Laterality Date   BACK SURGERY     CORONARY/GRAFT ACUTE MI REVASCULARIZATION N/A 03/23/2021   Procedure: Coronary/Graft Acute MI Revascularization;  Surgeon: Isaias Cowman, MD;  Location: [place] [facility];  Service: Cardiovascular;  Laterality: N/A;   IR GASTROSTOMY TUBE MOD SED  04/26/2021   LEFT HEART CATH AND CORONARY ANGIOGRAPHY N/A 03/23/2021   Procedure: LEFT HEART CATH AND CORONARY ANGIOGRAPHY;  Surgeon: Isaias Cowman, MD;  Location: [place] [facility];  Service: Cardiovascular;  Laterality: N/A;   SHOULDER SURGERY      Social History:  reports that he has never smoked. He has never used smokeless tobacco. He reports current alcohol use. He reports that he does not use drugs.  Allergies  Allergen Reactions   Lisinopril  Swelling    angioedema    No family history on file.   Prior to Admission medications   Medication Sig Start Date End Date Taking? Authorizing Provider  acetaminophen (TYLENOL) 325 MG tablet Place 2 tablets (650  mg total) into feeding tube every 6 (six) hours as needed for mild pain or fever. 03/30/21   Bradly Bienenstock, NP  apixaban (ELIQUIS) 5 MG TABS tablet Place 1 tablet (5 mg total) into feeding tube 2 (two) times daily. 06/28/21   Domenic Polite, MD  carvedilol (COREG) 25 MG tablet Place 1 tablet (25 mg total) into feeding tube 2 (two) times daily with a meal. 06/28/21   Domenic Polite, MD  chlorhexidine gluconate, MEDLINE KIT, (PERIDEX) 0.12 % solution 15 mLs by Mouth Rinse route 2 (two) times daily. 03/30/21   Bradly Bienenstock, NP  clonazePAM (KLONOPIN) 0.5 MG tablet Place 1 tablet (0.5 mg total) into feeding tube 2 (two) times daily. 08/07/21 08/07/22  Pokhrel, Corrie Mckusick, MD  doxazosin (CARDURA) 2 MG tablet Place 1 tablet (2 mg total) into feeding tube daily. 06/29/21   Domenic Polite, MD  fiber (NUTRISOURCE FIBER) PACK packet Place 1 packet into feeding tube 2 (two) times daily. 06/28/21   Domenic Polite, MD  glycopyrrolate (ROBINUL) 1 MG tablet Place 1 tablet (1 mg total) into feeding tube 3 (three) times daily. 08/07/21   Pokhrel, Corrie Mckusick, MD  hydrALAZINE (APRESOLINE) 50 MG tablet Place 1 tablet (50 mg total) into feeding tube 3 (three) times daily. 08/07/21   Pokhrel, Corrie Mckusick, MD  isosorbide dinitrate (ISORDIL) 30 MG tablet Place 1 tablet (30 mg total) into feeding tube 3 (three) times daily. 08/07/21   Pokhrel, Corrie Mckusick, MD  levETIRAcetam (KEPPRA) 100 MG/ML solution Place 7.5 mLs (750 mg total) into feeding tube 2 (two) times daily. 06/28/21   Domenic Polite, MD  Loperamide HCl 1 MG/7.5ML LIQD Place 15 mLs (2 mg total) into feeding tube daily as needed for diarrhea or loose stools. 06/28/21   Domenic Polite, MD  Mouthwashes (MOUTH RINSE) LIQD solution 15 mLs by Mouth Rinse route every 4 (four) hours. 03/30/21   Bradly Bienenstock, NP  Nutritional Supplements (FEEDING SUPPLEMENT, OSMOLITE 1.5 CAL,) LIQD Place 1,000 mLs into feeding tube continuous. 10/04/21   Shelly Coss, MD  Nutritional Supplements (FEEDING  SUPPLEMENT, PROSOURCE TF,) liquid Place 45 mLs into feeding tube 3 (three) times daily. 08/07/21   Pokhrel, Corrie Mckusick, MD  pantoprazole sodium (PROTONIX) 40 mg Place 40 mg into feeding tube at bedtime. 08/07/21   Pokhrel, Corrie Mckusick, MD  polyethylene glycol powder (GLYCOLAX/MIRALAX) 17 GM/SCOOP powder Place 1 capful  (17 g) into feeding tube daily as needed for mild constipation or moderate constipation. 06/28/21   Domenic Polite, MD  scopolamine (TRANSDERM-SCOP) 1 MG/3DAYS Place 1 patch (1.5 mg total) onto the skin every 3 (three) days. 06/29/21   Domenic Polite, MD  spironolactone (ALDACTONE) 25 MG tablet Place 1 tablet (25 mg total) into feeding tube daily. 06/29/21   Domenic Polite, MD  valproic acid (DEPAKENE) 250 MG/5ML solution Place 15 mLs (750 mg total) into feeding tube 3 (three) times daily. 06/28/21   Domenic Polite, MD  Water For Irrigation, Sterile (FREE WATER) SOLN Place 200 mLs into feeding tube every 6 (six) hours. 08/07/21   Flora Lipps, MD    Physical Exam: Vitals:   10/04/21 1555 10/04/21 1653 10/04/21 1841 10/04/21 2101  BP:  (!) 123/99    Pulse:  90 91 93  Resp:  20 (!) 22 13  Temp:  TempSrc:      SpO2:  96% 98% 100%  Weight: 97.5 kg     Height: $Remove'5\' 10"'epYpOtK$  (1.778 m)      Constitutional: Chronically ill-appearing man resting in bed, eyes open, nonverbal, not following commands Eyes: lids and conjunctivae normal ENMT: Mucous membranes are dry.  Neck: S/p tracheostomy on trach collar Respiratory: clear to auscultation anteriorly. Normal respiratory effort on 5 L O2 via trach collar.  No accessory muscle use.  Cardiovascular: Regular rate and rhythm, no murmurs / rubs / gallops. No extremity edema. 2+ pedal pulses. Abdomen: PEG tube in place.  No discernible tenderness, no masses palpated. GU: External catheter in place Musculoskeletal: no clubbing / cyanosis. No joint deformity upper and lower extremities.  Not moving extremities. Skin: Warm, dry Neurologic: Nonverbal, not  moving extremities or following commands Psychiatric: Eyes open but nonverbal  EKG: Not performed. Assessment/Plan Principal Problem:   Anoxic brain injury ([place]) Active Problems:   History of cardiac arrest   Chronic respiratory failure with hypoxia ([name])   DVT (deep venous thrombosis) ([name])   Pulmonary embolus ([name])   Dysphagia   HTN (hypertension)   Chronic systolic CHF (congestive heart failure) ([name])   Myoclonus   Tracheostomy dependent ([name])   S/P percutaneous endoscopic gastrostomy (PEG) tube placement ([name])   Billy Cherry is a 51 y.o. 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  Assessment and Plan: * Anoxic brain injury ([place]) Tracheostomy and PEG tube dependent Poor neurological recovery after cardiac arrest with persistent vegetative state.  Discharge to home 10/04/2021 however readmitted same day due to no home health services. -TOC consult -Continue trach and PEG tube care  History of cardiac arrest In January 2023.  Thought secondary to primary V-fib arrest with question of ACE inhibitor induced angioedema.  Chronic respiratory failure with hypoxia ([name]) Trach dependent, saturating well on 5 L O2 via trach collar.  Pulmonary embolus ([name]) Continue Eliquis.  DVT (deep venous thrombosis) ([place]) Lower extremity venous ultrasound 05/15/2021 showed age-indeterminate DVT involving left femoral, left proximal profunda, left posterior tibial, left popliteal, and left peroneal veins. -Continue Eliquis  Dysphagia PEG dependent.  Continue meds and tube feeding through PEG.  HTN (hypertension) Continue Coreg, Cardura, hydralazine, Isordil, spironolactone.  Chronic systolic CHF  (congestive heart failure) ([place]) Most recent EF 30-35% by TTE 09/17/2021. -Continue Coreg, hydralazine, Isordil, spironolactone -Strict I/O's and daily weights  Myoclonus Noted to have myoclonic seizures.  Continue antiepileptic therapy with Klonopin, Keppra, valproate.  DVT prophylaxis:  apixaban (ELIQUIS) tablet 5 mg   Code Status: Full code, based on prior.  Patient nonverbal and unable to reach spouse by phone. Family Communication: None available on admission Disposition Plan: From home, dispo pending back to home once home health arranged versus to [place] called: None Severity of Illness: The appropriate patient status for this patient is INPATIENT. Inpatient status is judged to be reasonable and necessary in order to provide the required intensity of service to ensure the patient's safety. The patient's presenting symptoms, physical exam findings, and initial radiographic and laboratory data in the context of their chronic comorbidities is felt to place them at high risk for further clinical deterioration. Furthermore, it is not anticipated that the patient will be medically  stable for discharge from the hospital within 2 midnights of admission.   * I certify that at the point of admission it is my clinical judgment that the patient will require inpatient hospital care spanning beyond 2 midnights from the point of admission due to high intensity of service, high risk for further deterioration and high frequency of surveillance required.Zada Finders MD [facility]  If 7PM-7AM, please contact night-coverage [email or web address]  10/04/2021, 9:51 PM

## 2021-10-04 NOTE — ED Provider Notes (Signed)
[facility] EMERGENCY DEPARTMENT Provider Note   CSN: [id number] Arrival date & time: 10/04/21  1547     History  Chief Complaint  Patient presents with   Medical Clearance    Billy Cherry is a 51 y.o. male.  HPI  51 year old male presents emergency department after discharge earlier today from the hospital after prior admission for cardiac arrest.  He was brought by EMS after his spouse stated that she could not take care of the patient at home due to lack of knowledge regarding trach maintenance as well as feeding tube maintenance, etc.  Home health nurses have not arrived yet to help her with said complaints.  Patient was brought back to the emergency department until placement or spouse is able to receive and care for the patient.  Patient is unaccompanied during HPI and PE for further information is unable to be obtained.  Past medical history significant for anoxic encephalopathy, hypertension, CHF, sepsis, DVT, pulmonary embolism  Home Medications Prior to Admission medications   Medication Sig Start Date End Date Taking? Authorizing Provider  acetaminophen (TYLENOL) 325 MG tablet Place 2 tablets (650 mg total) into feeding tube every 6 (six) hours as needed for mild pain or fever. 03/30/21  Yes Bradly Bienenstock, NP  apixaban (ELIQUIS) 5 MG TABS tablet Place 1 tablet (5 mg total) into feeding tube 2 (two) times daily. 06/28/21  Yes Domenic Polite, MD  carvedilol (COREG) 25 MG tablet Place 1 tablet (25 mg total) into feeding tube 2 (two) times daily with a meal. 06/28/21  Yes Domenic Polite, MD  chlorhexidine gluconate, MEDLINE KIT, (PERIDEX) 0.12 % solution 15 mLs by Mouth Rinse route 2 (two) times daily. 03/30/21  Yes Bradly Bienenstock, NP  clonazePAM (KLONOPIN) 0.5 MG tablet Place 1 tablet (0.5 mg total) into feeding tube 2 (two) times daily. Patient taking differently: Place 0.5 mg into feeding tube 3 (three) times daily. 08/07/21 08/07/22 Yes Pokhrel, Laxman,  MD  glycopyrrolate (ROBINUL) 1 MG tablet Place 1 tablet (1 mg total) into feeding tube 3 (three) times daily. 08/07/21  Yes Pokhrel, Laxman, MD  hydrALAZINE (APRESOLINE) 50 MG tablet Place 1 tablet (50 mg total) into feeding tube 3 (three) times daily. 08/07/21  Yes Pokhrel, Laxman, MD  isosorbide dinitrate (ISORDIL) 30 MG tablet Place 1 tablet (30 mg total) into feeding tube 3 (three) times daily. 08/07/21  Yes Pokhrel, Corrie Mckusick, MD  levETIRAcetam (KEPPRA) 100 MG/ML solution Place 7.5 mLs (750 mg total) into feeding tube 2 (two) times daily. 06/28/21  Yes Domenic Polite, MD  Loperamide HCl 1 MG/7.5ML LIQD Place 15 mLs (2 mg total) into feeding tube daily as needed for diarrhea or loose stools. 06/28/21  Yes Domenic Polite, MD  Mouthwashes (MOUTH RINSE) LIQD solution 15 mLs by Mouth Rinse route every 4 (four) hours. 03/30/21  Yes Darel Hong D, NP  Nutritional Supplements (FEEDING SUPPLEMENT, OSMOLITE 1.5 CAL,) LIQD Place 1,000 mLs into feeding tube continuous. 10/04/21  Yes Shelly Coss, MD  Nutritional Supplements (FEEDING SUPPLEMENT, PROSOURCE TF,) liquid Place 45 mLs into feeding tube 3 (three) times daily. Patient taking differently: Place 45 mLs into feeding tube 2 (two) times daily. 08/07/21  Yes Pokhrel, Laxman, MD  polyethylene glycol powder (GLYCOLAX/MIRALAX) 17 GM/SCOOP powder Place 1 capful  (17 g) into feeding tube daily as needed for mild constipation or moderate constipation. 06/28/21  Yes Domenic Polite, MD  scopolamine (TRANSDERM-SCOP) 1 MG/3DAYS Place 1 patch (1.5 mg total) onto the skin every 3 (three) days.  06/29/21  Yes Domenic Polite, MD  spironolactone (ALDACTONE) 25 MG tablet Place 1 tablet (25 mg total) into feeding tube daily. 06/29/21  Yes Domenic Polite, MD  valproic acid (DEPAKENE) 250 MG/5ML solution Place 15 mLs (750 mg total) into feeding tube 3 (three) times daily. 06/28/21  Yes Domenic Polite, MD  doxazosin (CARDURA) 2 MG tablet Place 1 tablet (2 mg total) into feeding tube  daily. Patient not taking: Reported on 10/05/2021 06/29/21   Domenic Polite, MD  fiber (NUTRISOURCE FIBER) PACK packet Place 1 packet into feeding tube 2 (two) times daily. Patient not taking: Reported on 10/05/2021 06/28/21   Domenic Polite, MD  pantoprazole sodium (PROTONIX) 40 mg Place 40 mg into feeding tube at bedtime. Patient not taking: Reported on 10/05/2021 08/07/21   Flora Lipps, MD  Water For Irrigation, Sterile (FREE WATER) SOLN Place 200 mLs into feeding tube every 6 (six) hours. 08/07/21   Pokhrel, Corrie Mckusick, MD      Allergies    Zestril [lisinopril]    Review of Systems   Review of Systems  All other systems reviewed and are negative.   Physical Exam Updated Vital Signs BP (!) 135/97   Pulse 75   Temp 98.5 F (36.9 C) (Axillary)   Resp 16   Ht 5' 10" (1.778 m)   Wt 97.5 kg   SpO2 99%   BMI 30.85 kg/m  Physical Exam Constitutional:      General: He is not in acute distress.    Appearance: He is not ill-appearing.     Comments: Patient sitting in bed with trach tube placed unresponsive to verbal stimuli.  Per baseline upon prior chart review from recent admission.  HENT:     Head: Normocephalic and atraumatic.     Mouth/Throat:     Mouth: Mucous membranes are moist.     Pharynx: Oropharynx is clear.  Cardiovascular:     Rate and Rhythm: Normal rate and regular rhythm.     Pulses: Normal pulses.  Pulmonary:     Effort: Pulmonary effort is normal.     Comments: Tracheostomy tube in place with suction front.  Minor sputum production noted at the end of tracheostomy site.  No surrounding erythema, areas of induration/fluctuance noted. Abdominal:     Tenderness: There is no abdominal tenderness. There is no guarding.     Comments: PEG tube in place with no signs of surrounding erythema, fluctuance or drainage.  Musculoskeletal:     Cervical back: Neck supple. No tenderness.     Right lower leg: No edema.     Left lower leg: No edema.  Skin:    General: Skin is warm  and dry.     Comments: No obvious signs of skin breakdown.     ED Results / Procedures / Treatments   Labs (all labs ordered are listed, but only abnormal results are displayed) Labs Reviewed  BASIC METABOLIC PANEL - Abnormal; Notable for the following components:      Result Value   Sodium 134 (*)    BUN 30 (*)    All other components within normal limits  HIV ANTIBODY (ROUTINE TESTING W REFLEX)  CBC  BASIC METABOLIC PANEL  CBC WITH DIFFERENTIAL/PLATELET    EKG None  Radiology No results found.  Procedures Procedures    Medications Ordered in ED Medications  acetaminophen (TYLENOL) tablet 650 mg (has no administration in time range)  apixaban (ELIQUIS) tablet 5 mg (5 mg Per Tube Given 10/05/21 0928)  carvedilol (COREG)  tablet 25 mg (25 mg Per Tube Given 10/05/21 0928)  clonazePAM (KLONOPIN) tablet 0.5 mg (0.5 mg Per Tube Given 10/05/21 0928)  doxazosin (CARDURA) tablet 2 mg (2 mg Per Tube Given 10/05/21 1044)  glycopyrrolate (ROBINUL) tablet 1 mg (1 mg Per Tube Given 10/05/21 1044)  hydrALAZINE (APRESOLINE) tablet 50 mg (50 mg Per Tube Given 10/05/21 0928)  isosorbide dinitrate (ISORDIL) tablet 30 mg (30 mg Per Tube Given 10/05/21 0928)  levETIRAcetam (KEPPRA) 100 MG/ML solution 750 mg (750 mg Per Tube Given 10/05/21 1046)  Oral care mouth rinse (15 mLs Mouth Rinse Not Given 10/05/21 1106)  feeding supplement (PROSource TF) liquid 45 mL (45 mLs Per Tube Given 10/05/21 1046)  sodium chloride flush (NS) 0.9 % injection 3 mL (3 mLs Intravenous Given 10/05/21 1049)  ondansetron (ZOFRAN) tablet 4 mg (has no administration in time range)    Or  ondansetron (ZOFRAN) injection 4 mg (has no administration in time range)  HYDROmorphone (DILAUDID) injection 1 mg (has no administration in time range)  polyethylene glycol powder (GLYCOLAX/MIRALAX) container 17 g (has no administration in time range)  scopolamine (TRANSDERM-SCOP) 1 MG/3DAYS 1.5 mg (1.5 mg Transdermal Patch Applied 10/05/21 1047)   spironolactone (ALDACTONE) tablet 25 mg (25 mg Per Tube Given 10/05/21 1045)  valproic acid (DEPAKENE) 250 MG/5ML solution 750 mg (750 mg Per Tube Given 10/05/21 0930)  free water 200 mL (200 mLs Per Tube Given 10/05/21 1235)  fiber (NUTRISOURCE FIBER) 1 packet (1 packet Per Tube Given 10/05/21 1045)  pantoprazole sodium (PROTONIX) 40 mg/20 mL oral suspension 40 mg (40 mg Per Tube Given 10/04/21 2352)    ED Course/ Medical Decision Making/ A&P Clinical Course as of 10/05/21 1345  Wed Oct 04, 2021  1725 Patient's spouse was attempted be contacted multiple times on the emergency department with no success.  Further details regarding current presentation was unable to be obtained. [CR]  2005 Dr. Posey Pronto from hospitalist team will admit the patient. [RP]    Clinical Course User Index [CR] Wilnette Kales, PA [RP] Fransico Meadow, MD                           Medical Decision Making Risk Decision regarding hospitalization.   This patient presents to the ED for concern of placement, this involves an extensive number of treatment options, and is a complaint that carries with it a high risk of complications and morbidity.  The differential diagnosis includes SNF placement/home health needs.  Social work contacted.   Co morbidities that complicate the patient evaluation  See HPI   Additional history obtained:  Additional history obtained from EMR External records from outside source obtained and reviewed including prior hospital admission.   Lab Tests:  N/a   Imaging Studies ordered:  N/a   Cardiac Monitoring: / EKG:  The patient was maintained on a cardiac monitor.  I personally viewed and interpreted the cardiac monitored which showed an underlying rhythm of: Sinus rhythm   Consultations Obtained:  I requested consultation with the TOC,  and discussed lab and imaging findings as well as pertinent plan - they recommend: Admission to the hospital until further outpatient care  can be sorted out.  See note for further detail.   Social Determinants of Health:  Entirely dependent on others for care given anoxic brain injury.   Test / Admission - Considered:  SNF placement/home health care Vitals signs within normal range and stable throughout visit. No laboratory  or imaging studies performed given that patient was just discharged from the hospital in stable nature with no acute changes in presentation per EMS/family/discharge notes.  Patient deemed cleared medically for social work for potential placement versus home health needs.  Spouse of patient was attempted to be contacted multiple times but contact was unsuccessful.  Patient to be admitted for chronic medical care pending SNF placement versus home health needs given that spouse is unable to take care of him at this time.  At shift change, patient care handed off to Dr. Philip Aspen. Patient stable upon shift change.        Final Clinical Impression(s) / ED Diagnoses Final diagnoses:  Encounter for social work intervention    Rx / DC Orders ED Discharge Orders     None         Wilnette Kales, [place] 10/05/21 1345    Fransico Meadow, MD 10/06/21 1140

## 2021-10-04 NOTE — Assessment & Plan Note (Signed)
Lower extremity venous ultrasound 05/15/2021 showed age-indeterminate DVT involving left femoral, left proximal profunda, left posterior tibial, left popliteal, and left peroneal veins. -Continue Eliquis

## 2021-10-04 NOTE — Progress Notes (Signed)
CSW spoke with Angelica Chessman at [facility] who states the patient is being brought back to [facility] ED due to lack of [facility] staff in the home. Patient requires oxygen through his trach and tube feeds that cannot be set up in the home without the assistance of [facility] staff.  CSW spoke with Jess Barters at [facility] who states he will not send a RN from the agency to the patient's home without the central air conditioning is fixed in the home.  Per patient's wife [place], there is a operational window unit in the home and that a service technician is scheduled to come to the home today at 6pm to fix the central unit.   TOC Director and supervisor aware of issue.  Edwin Dada, MSW, LCSW Transitions of Care  Clinical Social Worker II [phone]

## 2021-10-04 NOTE — Progress Notes (Signed)
TOC CSW attempted to contact Theola Sequin, pts wife [phone].  CSW left HIPPA compliant message with my contact information.   Stephanye Finnicum Tarpley-Carter, MSW, LCSW-A Pronouns:  She/Her/Hers [facility] HealthTransitions of Care Clinical Social Worker Direct Number:  [phone] [email or web address]

## 2021-10-04 NOTE — Progress Notes (Signed)
TOC CSW attempted to call Theola Sequin, pts wife at [phone].  CSW left HIPPA compliant message with my contact information.   Malaina Mortellaro Tarpley-Carter, MSW, LCSW-A Pronouns:  She/Her/Hers [facility] Clinical Social Worker Direct Number:  [phone] [email or web address]

## 2021-10-04 NOTE — Progress Notes (Signed)
Trach supplies ordered via ED Diplomatic Services operational officer.

## 2021-10-04 NOTE — Assessment & Plan Note (Signed)
-   Continue Eliquis 

## 2021-10-04 NOTE — Assessment & Plan Note (Signed)
Tracheostomy and PEG tube dependent Poor neurological recovery after cardiac arrest with persistent vegetative state.  Discharge to home 10/04/2021 however readmitted same day due to no home health services. -TOC consult -Continue trach and PEG tube care

## 2021-10-04 NOTE — Assessment & Plan Note (Signed)
In January 2023.  Thought secondary to primary V-fib arrest with question of ACE inhibitor induced angioedema.

## 2021-10-04 NOTE — Assessment & Plan Note (Addendum)
Continue Coreg, Cardura, hydralazine, Isordil, spironolactone.

## 2021-10-04 NOTE — Assessment & Plan Note (Signed)
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  Continue antiepileptic therapy with Klonopin, Keppra, valproate.

## 2021-10-04 NOTE — Progress Notes (Signed)
Patient will discharge home via [facility] at 9:30am. Patient's spouse Retillas will be at the home ready to receive patient. Patient's nursing services will be provided by [facility]. Patient's PCP will be Dr. Melynda Ripple through [facility].  CSW spoke with Retillas to inform her of discharge plan.  RN and MD aware of discharge plan.  Edwin Dada, MSW, LCSW Transitions of Care  Clinical Social Worker II [phone]

## 2021-10-04 NOTE — ED Triage Notes (Signed)
Pt BIB [facility] from home. Per EMS, pt discharged from this facility today, plan was to go home with spouse. Pt with anoxic brain injury with chronic trach, PEG. However, on arrival to patient's home, patient's spouse did not feel like she could adequately care for him d/t lack of knowledge on feeding tube, trach, etc. Home health nurses had not arrived yet to help her. Pt transported back to hospital until placement/spouse is ready to receive patient.

## 2021-10-05 DIAGNOSIS — G931 Anoxic brain damage, not elsewhere classified: Secondary | ICD-10-CM | POA: Diagnosis not present

## 2021-10-05 LAB — CBC
HCT: 40.8 % (ref 39.0–52.0)
Hemoglobin: 13.4 g/dL (ref 13.0–17.0)
MCH: 30.7 pg (ref 26.0–34.0)
MCHC: 32.8 g/dL (ref 30.0–36.0)
MCV: 93.6 fL (ref 80.0–100.0)
Platelets: 229 10*3/uL (ref 150–400)
RBC: 4.36 MIL/uL (ref 4.22–5.81)
RDW: 15.4 % (ref 11.5–15.5)
WBC: 9.5 10*3/uL (ref 4.0–10.5)
nRBC: 0 % (ref 0.0–0.2)

## 2021-10-05 LAB — BASIC METABOLIC PANEL
Anion gap: 8 (ref 5–15)
BUN: 30 mg/dL — ABNORMAL HIGH (ref 6–20)
CO2: 26 mmol/L (ref 22–32)
Calcium: 9.5 mg/dL (ref 8.9–10.3)
Chloride: 100 mmol/L (ref 98–111)
Creatinine, Ser: 0.77 mg/dL (ref 0.61–1.24)
GFR, Estimated: >60 mL/min (ref >60)
Glucose, Bld: 89 mg/dL (ref 70–99)
Potassium: 5 mmol/L (ref 3.5–5.1)
Sodium: 134 mmol/L — ABNORMAL LOW (ref 135–145)

## 2021-10-05 LAB — HIV ANTIBODY (ROUTINE TESTING W REFLEX): HIV Screen 4th Generation wRfx: NONREACTIVE

## 2021-10-05 NOTE — Progress Notes (Addendum)
  Progress Note   Patient: Billy Cherry [id number]:[id number] DOB: Jan 21, 1971 DOA: 10/04/2021     1 DOS: the patient was seen and examined on 10/05/2021        Brief hospital course:    51 y.o. male with medical history significant for recent prolonged hospital stay due to V-fib cardiac arrest and respiratory arrest resulting in anoxic brain injury with persistent vegetative state, s/p trach and PEG, HFrEF (EF 30-35%), PE/DVT on Eliquis, myoclonic seizures who presented to the ED after same-day discharge due to no home health services and spouse inability to care for patient.   8/3- d/w SW re: seek LTAC, alternative support Per CCM "discharge home on Monday 10/09/21 at 10am"  Assessment and Plan:  Persistent vegetative state No change - Consult trach team, TOC - Continue tube feeds, scopolamine, Imodium, PPI, Keppra, Klonopin, Depakote  Pulmonary embolism - Continue Eliquis   Hypertension and chronic systolic congestive heart failure Blood pressure normal and remains euvolemic EF less than 20% this hospitalization - Continue carvedilol, hydralazine, Isordil, spironolactone  * Anoxic brain injury ([name]) Tracheostomy and PEG tube dependent Poor neurological recovery after cardiac arrest with persistent vegetative state.  Discharge to home 10/04/2021 however readmitted same day due to no home health services. -TOC consult -Continue trach and PEG tube care   History of cardiac arrest In January 2023.  Thought secondary to primary V-fib arrest with question of ACE inhibitor induced angioedema.   Chronic respiratory failure with hypoxia ([name]) Trach dependent, saturating well on 5 L O2 via trach collar.   Pulmonary embolus ([name]) Continue Eliquis.   DVT (deep venous thrombosis) ([name]) Lower extremity venous ultrasound 05/15/2021 showed age-indeterminate DVT involving left femoral, left proximal profunda, left posterior tibial, left popliteal, and left peroneal veins. -Continue Eliquis    Dysphagia PEG dependent.  Continue meds and tube feeding through PEG.   HTN (hypertension) Continue Coreg, Cardura, hydralazine, Isordil, spironolactone.   Chronic systolic CHF (congestive heart failure) ([name]) Most recent EF 30-35% by TTE 09/17/2021. 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  Continue antiepileptic therapy with Klonopin, Keppra, valproate.         Subjective: No F, HDS overnight. No concerns. To discuss placement with CCM. Continue care as per recent hospitalizations.     Physical Exam: Vitals:   10/05/21 0500 10/05/21 0630 10/05/21 0652 10/05/21 0739  BP: (!) 127/95 (!) 126/97  (!) 140/115  Pulse: 84 77  81  Resp: 15 15  14   Temp:   98.5 F (36.9 C)   TempSrc:   Axillary   SpO2: 97% 99%  99%  Weight:      Height:       Adult male, tracheostomy PEG tube in place, unresponsive RRR, no murmurs, no peripheral edema Respiratory rate normal, tracheostomy in place, lungs clear without rales or wheezes, lung sounds diminished No grimace to palpation of the abdomen, which is soft and nondistended Unresponsive with no spontaneous verbalizations or movements, eyes closed     Disposition: Status is: Inpatient, awaiting disposition planning per CCM/Family       Author: , MD 10/05/2021 8:36 AM  For on call review [email or web address].

## 2021-10-05 NOTE — ED Notes (Signed)
Called and spoke to patient wife , states she will be coming on later. Patient has lots of secretions, suctioned patient and clean trach dressing applied.

## 2021-10-05 NOTE — Progress Notes (Addendum)
Received message from [place] Callas  this am concerning patient was returned to the ED after discharging home yesterday due to no HHC services to assist in the home. Per Jess Barters with [facility] [phone]), a HHRN could not go to the home until the [facility] was repaired. The [facility] was repaired yesterday 10/04/21 around 6pm. [facility] will be available to assist the patient Monday ( 10/09/21) at 10 am for start of care The [facility] will meet the patient when he arrives home to ensure a smooth transition. Zack with [employer] verifying all DME that was delivered to the home. Spoke to spouse- she stated the following equipment was in the home; Hospital bed - motor was replaced so that the bed can be elevated. Suction equipment - need canister/ hose Oxygen Trach Supplies Tube Feeding pump  Talked to spouse at length, lots of emotional support given. Spouse stated that the [facility] unit was repaired. Discussed discharge plan for Monday October 09, 2021. Coordinating care between hospital to home. Patient to be discharged via [facility] around 9:30am and [facility] will meet the patient upon arrival for start of care at 10am.  B Jeryl Columbia Transition of Care Supervisor [phone]  At discharge, Bedside Nurse, please send the inner Trach cannular with the patient./ taped to his gown.  11:40 am - message from [place] with [employer]  Items Mr. Kendra should have in his home currently: Oxygen Bed Suction Humidification Compressor Trach supplies Enteral Feeding Pump Enteral Feeds Spouse has [employer] number to call for any questions or concerns. Abelino Derrick RN,MHA,CCM

## 2021-10-05 NOTE — Plan of Care (Signed)

## 2021-10-05 NOTE — ED Notes (Signed)
RT at bedside.

## 2021-10-05 NOTE — Progress Notes (Addendum)
10:30am: CSW was notified by Lorette Ang supervisor to arrange for transportation home on Monday (10/09/2021) morning at 9:30am.  CSW spoke with Kyung Rudd at [place] to arrange for transportation with a pick up time of 9:30am.  8:40am: Patient's medications were delivered by [place] supervisor to the home on 10/04/2021 and were given to Retillas.  CSW spoke with Retillas who wants to speak with [facility] supervisor.  CSW spoke with Lorette Ang supervisor who will contact Retillas.  Edwin Dada, MSW, LCSW Transitions of Care  Clinical Social Worker II [phone]

## 2021-10-06 DIAGNOSIS — G931 Anoxic brain damage, not elsewhere classified: Secondary | ICD-10-CM | POA: Diagnosis not present

## 2021-10-06 MED ORDER — OSMOLITE 1.5 CAL PO LIQD
355.0000 mL | Freq: Four times a day (QID) | ORAL | Status: DC
  Administered 2021-10-06 – 2021-10-24 (×73): 355 mL
  Filled 2021-10-06 (×79): qty 474

## 2021-10-06 NOTE — Progress Notes (Signed)
Notified MD that patient does not have tube feeding orders. MD was not sure why and placed a consult to dietitian. No other new orders obtained will continue to monitor.

## 2021-10-06 NOTE — Progress Notes (Signed)
Patient seen today by trach team for consult.  No education is needed at this time.  All necessary equipment is at beside.   Will continue to follow for progression.  

## 2021-10-06 NOTE — Progress Notes (Signed)
Initial Nutrition Assessment  DOCUMENTATION CODES:   Not applicable  INTERVENTION:   Bolus TF via PEG: Osmolite 1.5 355 ml QID Prosource TF 45 ml TID  Provides 2250 kcal, 122 gm protein, 1086 ml free water daily.  NUTRITION DIAGNOSIS:   Inadequate oral intake related to inability to eat as evidenced by NPO status.  GOAL:   Patient will meet greater than or equal to 90% of their needs  MONITOR:   TF tolerance  REASON FOR ASSESSMENT:   Consult Enteral/tube feeding initiation and management  ASSESSMENT:   51 yo male admitted shortly after discharge d/t lack of home care. PMH includes recent admit for cardiac arrest resulting in ABI with persistent vegetative state, trach, PEG, CHF, myoclonic seizures.  Patient familiar to RD team from recent admission. He was receiving continuous TF via PEG and has been transitioned to bolus feeding. Currently receiving bolus feedings of Osmolite 1.5 355 ml QID with Prosource TF 45 ml TID and Nutrisource fiber BID and free water flushes. This provides 2250 kcal, 122 gm protein, 1086 ml free water daily.   Labs reviewed. Na 134 (8/2) Medications reviewed and include Protonix, spironolactone.  Weight 97.4 kg, down from 101.8 kg in January. Decreased weight and depletion of muscle mass is related to PVS, inactivity.   NUTRITION - FOCUSED PHYSICAL EXAM:  Flowsheet Row Most Recent Value  Orbital Region No depletion  Upper Arm Region No depletion  Thoracic and Lumbar Region No depletion  Buccal Region No depletion  Temple Region Mild depletion  Clavicle Bone Region Mild depletion  Clavicle and Acromion Bone Region Mild depletion  Scapular Bone Region Mild depletion  Dorsal Hand Mild depletion  Patellar Region Moderate depletion  Anterior Thigh Region Moderate depletion  Posterior Calf Region Moderate depletion  Edema (RD Assessment) Mild  Hair Reviewed  Eyes Unable to assess  Mouth Unable to assess  Skin Reviewed  Nails Reviewed        Diet Order:   Diet Order             Diet NPO time specified  Diet effective now                   EDUCATION NEEDS:   No education needs have been identified at this time  Skin:  Skin Assessment: Skin Integrity Issues: Skin Integrity Issues:: Stage I Stage I: R foot, R heel  Last BM:  8/2  Height:   Ht Readings from Last 1 Encounters:  10/04/21 5\' 10"  (1.778 m)    Weight:   Wt Readings from Last 1 Encounters:  10/06/21 97.4 kg    BMI:  Body mass index is 30.81 kg/m.  Estimated Nutritional Needs:   Kcal:  2200-2400  Protein:  115-125 gm  Fluid:  >/= 2.2 L   12/06/21 RD, LDN, CNSC Please refer to Amion for contact information.

## 2021-10-06 NOTE — TOC Initial Note (Addendum)
Transition of Care [facility]) - Initial/Assessment Note    Patient Details  Name: Billy Cherry MRN: [id number] Date of Birth: [birth year]  Transition of Care [facility]) CM/SW Contact:    Ledon Snare Transition of Care Supervisor Phone Number: [phone] 10/06/2021, 10:39 AM  Clinical Narrative:                 Patient known from previous adm - on the DTP Team. Patient was discharged home and readm with 24hrs. Currently coordination care for transition home scheduled for Monday Oct 09, 2021.   Teaching spouse on trach care as per note from Luan Pulling [facility] on 07/27/21 - The patient's wife states that she needs nursing coverage in the home on Monday, Tuesday, Wednesday and Friday from 8 am til 6 pm while she is working.  She confirmed that she has been taught trach care and PEG care/feedings and does not need additional teaching.  I asked that the wife speak with the patient's 51 year old son and plan to have him learn trach teaching in the next couple of days in the hospital.  The wife agreed and confirmed.  I sent a secure email to Lucius Conn, RT with Trach team at the hospital and requested teaching with the son as well.  Bedside nursing was notified and Staci Righter, RN states that she will notify evening and night shift along with available RT to teach the patient's adult son trach teaching as well in case he comes by the hospital on off hours.  10:15 am - Zack with [employer]  DMEcalled concerning someone to go tot he patient's home to unbox equipment that was delivered to the home - awaiting a call back. BLC  12:34 pm- Telephone call to spouse. Noted Legal Guardian on chart Jolene Provost) - spouse does not know who this person and stated that she is the legal guardian of the patient. [employer] rep will be at her home at 2pm to set up the DME. She does not have any questions or concerns at this time. Voice Message to [employer] of [facility] / verifying [facility] RN  for Monday am. BLC  1:45 pm- Message from Adams with [employer] DME- he is sending out a Respiratory Therapist to assist [employer] for home. He is currently coordinating this with spouse. Annamaria Boots with [employer] went to the patient's home today at 12:30 pm to re- educate the spouse of all of the equipment. BLC  Expected Discharge Plan: Home w Home Health Services Barriers to Discharge: Family Issues   Patient Goals and CMS Choice Patient states their goals for this hospitalization and ongoing recovery are:: care for patient at home CMS Medicare.gov Compare Post Acute Care list provided to:: Patient Choice offered to / list presented to : Spouse  Expected Discharge Plan and Services Expected Discharge Plan: Home w Home Health Services In-house Referral: Clinical Social Work, Nutrition, Artist Discharge Planning Services: CM Consult Post Acute Care Choice: Home Health Living arrangements for the past 2 months: Single Family Home                 DME Arranged: Janina Mayo supplies, Tube feeding, Tube feeding pump, Oxygen, Hospital bed, Suction DME Agency: [employer] Date DME Agency Contacted: 10/05/21 Time DME Agency Contacted: [phone]              Prior Living Arrangements/Services Living arrangements for the past 2 months: Single Family Home Lives with:: Spouse Patient language and need for interpreter reviewed::  Yes Do you feel safe going back to the place where you live?: Yes      Need for Family Participation in Patient Care: Yes (Comment) Care giver support system in place?: Yes (comment)   Criminal Activity/Legal Involvement Pertinent to Current Situation/Hospitalization: No - Comment as needed  Activities of Daily Living      Permission Sought/Granted Permission sought to share information with : Case Manager, PCP Permission granted to share information with : Yes, Release of Information Signed              Emotional Assessment Appearance:: Appears older  than stated age Attitude/Demeanor/Rapport: Unable to Assess Affect (typically observed): Unable to Assess   Alcohol / Substance Use: Not Applicable Psych Involvement: No (comment)  Admission diagnosis:  Anoxic brain injury ([name]) [G93.1] Encounter for social work intervention [Z76.89] Patient Active Problem List   Diagnosis Date Noted   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   S/P percutaneous endoscopic gastrostomy (PEG) tube placement ([name]) 10/04/2021   Obesity (BMI 30-39.9) 08/16/2021   Pulmonary embolus ([name]) 06/06/2021   Pressure injury of skin 05/25/2021   DVT (deep venous thrombosis) ([name]) 05/16/2021   Dysphagia 05/01/2021   HTN (hypertension) 04/24/2021   Sepsis ([name]) 04/20/2021   HAP (hospital-acquired pneumonia) 04/19/2021   Chronic systolic CHF (congestive heart failure) ([name]) 04/05/2021   Anoxic brain injury ([name]) 04/03/2021   Myoclonus 03/30/2021   Chronic respiratory failure with hypoxia [facility])    History of cardiac arrest 03/23/2021   PCP:  Patient, No Pcp Per Pharmacy:   [facility] [address], [place] - [address] [place] [address] Phone: [phone] Fax: [phone]  [facility] [address] [place] [address] Phone: [phone] Fax: [phone]     Social Determinants of Health (SDOH) Interventions    Readmission Risk Interventions    04/14/2021   11:14 AM  Readmission Risk Prevention Plan  Transportation Screening Complete  HRI or Home Care Consult Complete  Social Work Consult for Recovery Care Planning/Counseling Complete  Palliative Care Screening Complete  Medication Review Oceanographer) Complete

## 2021-10-06 NOTE — Plan of Care (Signed)
  Problem: Elimination: Goal: Will not experience complications related to bowel motility Outcome: Progressing Goal: Will not experience complications related to urinary retention Outcome: Progressing   Problem: Nutrition: Goal: Adequate nutrition will be maintained Outcome: Progressing   

## 2021-10-06 NOTE — Consult Note (Signed)
   [facility] Inpatient Consult   10/06/2021  Billy Cherry February 21, 1971 [id number]  Managed Medicaid [MM]: [employer] currently showing   Primary Care Provider:  Patient, No Pcp Per   Patient on readmission less than 7 days  Patient reviewed for readmission prevention screen.    10:39 am No family at bedside. Spoke with wife by phone with 2 HIPAA identifiers, explained this writer's role for MM readmission follow up. She states she will accept any help she could have for him. She confirms at this time that the patient is to return home. Reviewed TOC notes for this admission. Spoke with inpatient [facility] supervisor regarding patient on readmission list with MM plan and showing a legal guardian noted, she is following up with wife. Patient is currently for home with [facility] services.   Plan:  Follow for disposition and will make referral for MM team for post hospital support. Continue to follow and refer when closer to transition.  Charlesetta Shanks, RN BSN CCM [facility]  [phone] business mobile phone Toll free office [phone]  Fax number: [phone] [email or web address]

## 2021-10-06 NOTE — Care Management (Signed)
Late Entry  8/4/ 23 W. Aundria Rud RN BSN ED RNCM communicated with Leory Plowman RN on 2W concerning teaching wife trach and tube feed management when she arrives. Haylee stated, she will follow up with making sure the wife is taught.

## 2021-10-06 NOTE — Progress Notes (Signed)
  Progress Note   Patient: Billy Cherry [id number]:[id number] DOB: 1970-04-30 DOA: 10/04/2021     2 DOS: the patient was seen and examined on 10/06/2021        Brief hospital course:    51 y.o. male with medical history significant for recent prolonged hospital stay due to V-fib cardiac arrest and respiratory arrest resulting in anoxic brain injury with persistent vegetative state, s/p trach and PEG, HFrEF (EF 30-35%), PE/DVT on Eliquis, myoclonic seizures who presented to the ED after same-day discharge due to no home health services and spouse inability to care for patient.   8/3- d/w SW re: seek LTAC, alternative support Per CCM "discharge home on Monday 10/09/21 at 10am" Medically ready, will need tos tay weekend due to [facility] availability.  Assessment and Plan:  Persistent vegetative state No change - Consult trach team, TOC - Continue tube feeds, scopolamine, Imodium, PPI, Keppra, Klonopin, Depakote  Pulmonary embolism - Continue Eliquis   Hypertension and chronic systolic congestive heart failure Blood pressure normal and remains euvolemic EF less than 20% this hospitalization - Continue carvedilol, hydralazine, Isordil, spironolactone  * Anoxic brain injury ([name]) Tracheostomy and PEG tube dependent Poor neurological recovery after cardiac arrest with persistent vegetative state.  Discharge to home 10/04/2021 however readmitted same day due to no home health services. -TOC consult -Continue trach and PEG tube care   History of cardiac arrest In January 2023.  Thought secondary to primary V-fib arrest with question of ACE inhibitor induced angioedema.   Chronic respiratory failure with hypoxia ([name]) Trach dependent, saturating well on 5 L O2 via trach collar.   Pulmonary embolus ([name]) Continue Eliquis.   DVT (deep venous thrombosis) ([name]) Lower extremity venous ultrasound 05/15/2021 showed age-indeterminate DVT involving left femoral, left proximal profunda, left posterior tibial,  left popliteal, and left peroneal veins. -Continue Eliquis   Dysphagia PEG dependent.  Continue meds and tube feeding through PEG.   HTN (hypertension) Continue Coreg, Cardura, hydralazine, Isordil, spironolactone.   Chronic systolic CHF (congestive heart failure) ([name]) Most recent EF 30-35% by TTE 09/17/2021. 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  Continue antiepileptic therapy with Klonopin, Keppra, valproate.         Subjective: Naeon. Doing OK No F, HDS overnight. No concerns. To discuss placement with CCM. Continue care as per recent hospitalizations.     Physical Exam: Vitals:   10/06/21 0419 10/06/21 0500 10/06/21 0742 10/06/21 0747  BP:      Pulse: 76  70 81  Resp: 20  18 18   Temp: 98 F (36.7 C)     TempSrc: Axillary     SpO2: 99%  100% 97%  Weight:  97.4 kg    Height:       Adult male, tracheostomy PEG tube in place, unresponsive RRR, no murmurs, no peripheral edema Respiratory rate normal, tracheostomy in place, lungs clear without rales or wheezes, lung sounds diminished No grimace to palpation of the abdomen, which is soft and nondistended Unresponsive with no spontaneous verbalizations or movements, eyes closed     Disposition: Status is: Inpatient, awaiting disposition planning per CCM/Family       Author: , MD 10/06/2021 8:23 AM  For on call review [email or web address].

## 2021-10-07 DIAGNOSIS — G931 Anoxic brain damage, not elsewhere classified: Secondary | ICD-10-CM | POA: Diagnosis not present

## 2021-10-07 NOTE — Progress Notes (Signed)
Nutrition Follow-up  DOCUMENTATION CODES:   Not applicable  INTERVENTION:   Bolus TF via PEG: Osmolite 1.5 355 ml QID Prosource TF 45 ml TID  200 ml free water flush every 6 hours   Provides 2250 kcal, 122 gm protein, 1086 ml free water daily. Total free water: 1886 ml daily  NUTRITION DIAGNOSIS:   Inadequate oral intake related to inability to eat as evidenced by NPO status.  Ongoing  GOAL:   Patient will meet greater than or equal to 90% of their needs  Met with TF  MONITOR:   TF tolerance  REASON FOR ASSESSMENT:   Consult Assessment of nutrition requirement/status  ASSESSMENT:   51 yo male admitted shortly after discharge d/t lack of home care. PMH includes recent admit for cardiac arrest resulting in ABI with persistent vegetative state, trach, PEG, CHF, myoclonic seizures.  Reviewed I/O's: +1.7 L x 24 hours and +479 ml since admisison  Drain output: 500 ml x 24 hours   Pt remains unresponsive. Plan to d/c home on Monday with home health care.  Medications reviewed and include keppra.  Labs reviewed: Na: 134, CBGS: 122 (inpatient orders for glycemic control are none).    Diet Order:   Diet Order             Diet NPO time specified  Diet effective now                   EDUCATION NEEDS:   No education needs have been identified at this time  Skin:  Skin Assessment: Skin Integrity Issues: Skin Integrity Issues:: Stage I Stage I: R foot, R heel  Last BM:  8/2  Height:   Ht Readings from Last 1 Encounters:  10/04/21 _0  (1.778 m)    Weight:   Wt Readings from Last 1 Encounters:  10/07/21 95.5 kg   BMI:  Body mass index is 30.21 kg/m.  Estimated Nutritional Needs:   Kcal:  2200-2400  Protein:  115-125 gm  Fluid:  >/= 2.2 L    Loistine Chance, RD, LDN, [place] Registered Dietitian II Certified Diabetes Care and Education Specialist Please refer to [facility] for RD and/or RD on-call/weekend/after hours pager

## 2021-10-07 NOTE — Progress Notes (Signed)
  Progress Note  Patient: Billy Cherry [id number]:[id number] DOB: 1971-02-24  DOA: 10/04/2021  DOS: 10/07/2021    Brief hospital course: Elwin Tsou is a 51 y.o. 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  He remains medically stable, though will await home health availability prior to discharge in the morning of 8/7.   Assessment and Plan: * Anoxic brain injury ([name]) Tracheostomy and PEG tube dependent Poor neurological recovery after cardiac arrest with persistent vegetative state.  Discharge to home 10/04/2021 however readmitted same day due to no home health services. -TOC consult -Continue trach and PEG tube care  History of cardiac arrest In January 2023.  Thought secondary to primary V-fib arrest with question of ACE inhibitor induced angioedema.  Chronic respiratory failure with hypoxia ([name]) Trach dependent, saturating well on 5 L O2 via trach collar.  Pulmonary embolus ([name]) Continue Eliquis.  DVT (deep venous thrombosis) ([name]) Lower extremity venous ultrasound 05/15/2021 showed age-indeterminate DVT involving left femoral, left proximal profunda, left posterior tibial, left popliteal, and left peroneal veins. -Continue Eliquis  Dysphagia PEG dependent.  Continue meds and tube feeding through PEG.  HTN (hypertension) Continue Coreg, Cardura, hydralazine, Isordil, spironolactone.  Chronic systolic CHF (congestive heart failure) ([name]) Most recent EF 30-35% by TTE 09/17/2021. -Continue Coreg, hydralazine, Isordil, spironolactone -Strict I/O's and daily weights  Myoclonus Noted to have myoclonic seizures.  Continue antiepileptic therapy with Klonopin, Keppra, valproate.  Obesity:  Estimated body mass index is 30.21 kg/m as calculated from the following:   Height as of this encounter: 5\' 10"  (1.778 m).   Weight as of this encounter: 95.5 kg.  Subjective: Pt not meaningfully responsive, no overnight events noted.  Objective: Vitals:   10/07/21 0500 10/07/21 0751 10/07/21 0820 10/07/21 1042  BP:  (!) 158/109    Pulse:  83 88 93  Resp:  (!) 22 (!) 24 (!) 22  Temp:  98.1 F (36.7 C)    TempSrc:  Oral    SpO2:  100% 99% 98%  Weight: 95.5 kg     Height:       Gen: Chronically ill-appearing 51 y.o. male in no distress Neck: Trach with moderate secretions, no purulence Pulm: Nonlabored breathing thru trach, no adventitious lower respiratory sounds.  CV: Regular rate and rhythm. No murmur, rub, or gallop. No JVD, no pitting dependent edema. GI: Abdomen soft, non-distended, with normoactive bowel sounds. No grimace with abdominal palpation. Ext: Warm, dry, no deformities Skin: PEG site c/d/i   Data Personally reviewed: CBC: Recent Labs  Lab 10/04/21 2347  WBC 9.5  HGB 13.4  HCT 40.8  MCV 93.6  PLT 229   Basic Metabolic Panel: Recent Labs  Lab 10/04/21 2347  NA 134*  K 5.0  CL 100  CO2 26  GLUCOSE 89  BUN 30*  CREATININE 0.77  CALCIUM 9.5   12/04/21, MD 10/07/2021 10:43 AM Page by [email or web address]

## 2021-10-08 DIAGNOSIS — G931 Anoxic brain damage, not elsewhere classified: Secondary | ICD-10-CM | POA: Diagnosis not present

## 2021-10-08 LAB — GLUCOSE, CAPILLARY: Glucose-Capillary: 102 mg/dL — ABNORMAL HIGH (ref 70–99)

## 2021-10-08 NOTE — Progress Notes (Signed)
  Progress Note  Patient: Billy Cherry [id number]:[id number] DOB: 11/07/1970  DOA: 10/04/2021  DOS: 10/08/2021    Brief hospital course: Billy Cherry is a 51 y.o. 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  He remains medically stable, though will await home health availability prior to discharge in the morning of 8/7.   Assessment and Plan: * Anoxic brain injury ([name]) Tracheostomy and PEG tube dependent Poor neurological recovery after cardiac arrest with persistent vegetative state.  Discharge to home 10/04/2021 however readmitted same day due to no home health services. -TOC consult -Continue trach and PEG tube care  History of cardiac arrest In January 2023.  Thought secondary to primary V-fib arrest with question of ACE inhibitor induced angioedema.  Chronic respiratory failure with hypoxia ([name]) Trach dependent, saturating well on 5 L O2 via trach collar.  Pulmonary embolus ([name]) Continue Eliquis.  DVT (deep venous thrombosis) ([name]) Lower extremity venous ultrasound 05/15/2021 showed age-indeterminate DVT involving left femoral, left proximal profunda, left posterior tibial, left popliteal, and left peroneal veins. -Continue Eliquis  Dysphagia PEG dependent.  Continue meds and tube feeding through PEG.  HTN (hypertension) Continue Coreg, Cardura, hydralazine, Isordil, spironolactone.  Chronic systolic CHF (congestive heart failure) ([name]) Most recent EF 30-35% by TTE 09/17/2021. -Continue Coreg, hydralazine, Isordil, spironolactone -Strict I/O's and daily weights  Myoclonus Noted to have myoclonic seizures.  Continue antiepileptic therapy with Klonopin, Keppra, valproate.  Obesity:  Estimated body mass index is 30.21 kg/m as calculated from the following:   Height as of this encounter: 5\' 10"  (1.778 m).   Weight as of this encounter: 95.5 kg.  Subjective: No overnight events.  Objective: Vitals:   10/08/21 0705 10/08/21 0737 10/08/21 1022 10/08/21 1102  BP:  (!) 184/132  (!) 137/106  Pulse: 94 88 96 95  Resp: (!) 22 20 20    Temp:  98.5 F (36.9 C)    TempSrc:  Oral    SpO2: 96% 100% 96%   Weight:      Height:      Chronically ill-appearing male in no distress, not meaningfully responsive Trach with moderate, unchanged secretions, no exudate or bleeding Clear, nonlabored RRR, no pitting edema PEG site c/d/I, no grimace on abdominal palpation.    Data Personally reviewed: CBC: Recent Labs  Lab 10/04/21 2347  WBC 9.5  HGB 13.4  HCT 40.8  MCV 93.6  PLT 229   Basic Metabolic Panel: Recent Labs  Lab 10/04/21 2347  NA 134*  K 5.0  CL 100  CO2 26  GLUCOSE 89  BUN 30*  CREATININE 0.77  CALCIUM 9.5   12/04/21, MD 10/08/2021 12:24 PM Page by [email or web address]

## 2021-10-09 ENCOUNTER — Other Ambulatory Visit (HOSPITAL_COMMUNITY): Payer: Self-pay

## 2021-10-09 DIAGNOSIS — R1310 Dysphagia, unspecified: Secondary | ICD-10-CM | POA: Diagnosis not present

## 2021-10-09 DIAGNOSIS — I5022 Chronic systolic (congestive) heart failure: Secondary | ICD-10-CM

## 2021-10-09 DIAGNOSIS — Z931 Gastrostomy status: Secondary | ICD-10-CM

## 2021-10-09 DIAGNOSIS — Z8674 Personal history of sudden cardiac arrest: Secondary | ICD-10-CM

## 2021-10-09 DIAGNOSIS — J9611 Chronic respiratory failure with hypoxia: Secondary | ICD-10-CM

## 2021-10-09 DIAGNOSIS — Z930 Tracheostomy status: Secondary | ICD-10-CM

## 2021-10-09 DIAGNOSIS — I2699 Other pulmonary embolism without acute cor pulmonale: Secondary | ICD-10-CM

## 2021-10-09 DIAGNOSIS — G931 Anoxic brain damage, not elsewhere classified: Secondary | ICD-10-CM | POA: Diagnosis not present

## 2021-10-09 DIAGNOSIS — G253 Myoclonus: Secondary | ICD-10-CM

## 2021-10-09 DIAGNOSIS — I10 Essential (primary) hypertension: Secondary | ICD-10-CM

## 2021-10-09 NOTE — Progress Notes (Addendum)
1:30pm: CSW spoke with Vance Gather of [facility] who states the agency has submitted for insurance authorization through [facility] for 112 hours of service a week. Vance Gather will return call to CSW with updates as they become available.  8:30am: CSW was notified by [facility] supervisor that the patient cannot be discharged home at this time due to concerns with care expressed by the patient's wife.   [facility] staff to communicate with patient's wife and will return call to [facility] supervisor.  CSW spoke with Ian Malkin of [employer] who states the patient has all ordered DME in the home with the only exception of intercannulas. CSW notified MD and RN of need.  Edwin Dada, MSW, LCSW Transitions of Care  Clinical Social Worker II [phone]

## 2021-10-09 NOTE — TOC Progression Note (Signed)
Transition of Care [facility]) - Progression Note    Patient Details  Name: Billy Cherry MRN: [id number] Date of Birth: 30-Nov-1970  Transition of Care [facility] CM/SW Contact  Reola Mosher Transition of Care Supervisor Phone Number: [phone] 10/09/2021, 9:59 AM  Clinical Narrative:    Received call from [facility] agency, they are not accepting the patient for [facility] services. Talked to spouse, she stated " I know how to care for him at home, have all of the equipment and is ready for himto return home and I do not want him to go to a rehab facility." Currently unable to transition patient home without [facility] assistance. TOC will continue to follow patient.   Expected Discharge Plan: Home w Home Health Services Barriers to Discharge: Family Issues  Expected Discharge Plan and Services Expected Discharge Plan: Home w Home Health Services In-house Referral: Clinical Social Work, Nutrition, Artist Discharge Planning Services: CM Consult Post Acute Care Choice: Home Health Living arrangements for the past 2 months: Single Family Home Expected Discharge Date: 10/09/21               DME Arranged: [facility] Mayo supplies, Tube feeding, Tube feeding pump, Oxygen, Hospital bed, Suction DME Agency: [employer] Date DME Agency Contacted: 10/05/21 Time DME Agency Contacted: [phone]               Social Determinants of Health (SDOH) Interventions    Readmission Risk Interventions    04/14/2021   11:14 AM  Readmission Risk Prevention Plan  Transportation Screening Complete  HRI or Home Care Consult Complete  Social Work Consult for Recovery Care Planning/Counseling Complete  Palliative Care Screening Complete  Medication Review [employer]ographer) Complete

## 2021-10-09 NOTE — Discharge Summary (Signed)
Physician Discharge Summary   Patient: Billy Cherry MRN: [id number] DOB: 1970-06-20  Admit date:     10/04/2021  Discharge date: 10/09/21  Discharge Physician: Patrecia Pour   PCP: Patient, No Pcp Per   Recommendations at discharge:  Follow-up with primary care provider after discharge, consider repeat CBC, CMP periodically.  Tracheostomy and PEG tube to be managed by patient's new outpatient home care agency; if questions arise, referral to [facility] (for the [facility]) could be arranged  Discharge Diagnoses: Principal Problem:   Anoxic brain injury ([place]) Active Problems:   History of cardiac arrest   Chronic respiratory failure with hypoxia ([place])   DVT (deep venous thrombosis) ([name])   Pulmonary embolus ([name])   Dysphagia   HTN (hypertension)   Chronic systolic CHF (congestive heart failure) ([place])   Myoclonus   Tracheostomy dependent ([place])   S/P percutaneous endoscopic gastrostomy (PEG) tube placement [facility])   Hospital Course: Billy Cherry is a 51 y.o. 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   He remained medically stable, and has had care management and home health arranged prior to repeat discharge in the morning of 8/7.   Assessment and Plan: * Anoxic brain injury ([place]) Tracheostomy and PEG tube dependent Poor neurological recovery after cardiac arrest with persistent vegetative state.  Discharge to home 10/04/2021 however readmitted same day due to no home health services. -TOC consult -Continue trach and PEG tube care   History of cardiac arrest In January 2023.  Thought secondary to primary V-fib arrest with question of ACE inhibitor induced angioedema.    Chronic respiratory failure with hypoxia ([name]) Trach dependent, saturating well on 5 L O2 via trach collar.   Pulmonary embolus ([name]) Continue Eliquis.   DVT (deep venous thrombosis) ([place]) Lower extremity venous ultrasound 05/15/2021 showed age-indeterminate DVT involving left femoral, left proximal profunda, left posterior tibial, left popliteal, and left peroneal veins. -Continue Eliquis   Dysphagia PEG dependent.  Continue meds and tube feeding through PEG.   HTN (hypertension) Continue Coreg, Cardura, hydralazine, Isordil, spironolactone.   Chronic systolic CHF (congestive heart failure) ([place]) Most recent EF 30-35% by TTE 09/17/2021. 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  Continue antiepileptic therapy with Klonopin, Keppra, valproate.   Obesity: Estimated body mass index is 30.21 kg/m   Consultants: Care management Procedures performed: None  Disposition: Home Diet recommendation: NPO, continue tube feeds DISCHARGE MEDICATION: Allergies as of 10/09/2021       Reactions   Zestril [lisinopril] Swelling   angioedema        Medication List     TAKE these medications    acetaminophen 325 MG tablet Commonly known as: TYLENOL Place 2 tablets (650 mg total) into feeding tube every 6 (six) hours as needed for mild pain or fever.   atropine 1 % ophthalmic solution Place 2 drops under the tongue 4 (four) times daily.   carvedilol 25 MG tablet Commonly known as: COREG Place 1 tablet (25 mg total) into feeding tube 2 (two) times daily with a meal.   chlorhexidine gluconate (MEDLINE KIT) 0.12 % solution Commonly known as: PERIDEX 15 mLs by Mouth Rinse route 2 (two) times daily.  clonazePAM 0.5 MG tablet Commonly known as: KLONOPIN Place 1 tablet (0.5 mg total) into feeding tube 2 (two) times daily. What changed: when to take this   doxazosin 2 MG tablet Commonly known  as: CARDURA Place 1 tablet (2 mg total) into feeding tube daily.   Eliquis 5 MG Tabs tablet Generic drug: apixaban Place 1 tablet (5 mg total) into feeding tube 2 (two) times daily.   feeding supplement (PROSource TF) liquid Place 45 mLs into feeding tube 3 (three) times daily. What changed: when to take this   feeding supplement (OSMOLITE 1.5 CAL) Liqd Place 1,000 mLs into feeding tube continuous. What changed: Another medication with the same name was changed. Make sure you understand how and when to take each.   fiber Pack packet Place 1 packet into feeding tube 2 (two) times daily.   free water Soln Place 200 mLs into feeding tube every 6 (six) hours.   Gerhardt's butt cream Crea Apply 1 Application topically daily.   glycopyrrolate 1 MG tablet Commonly known as: ROBINUL Place 1 tablet (1 mg total) into feeding tube 3 (three) times daily.   hydrALAZINE 50 MG tablet Commonly known as: APRESOLINE Place 1 tablet (50 mg total) into feeding tube 3 (three) times daily.   Imodium A-D 1 MG/7.5ML Liqd Generic drug: Loperamide HCl Place 15 mLs (2 mg total) into feeding tube daily as needed for diarrhea or loose stools.   ipratropium-albuterol 0.5-2.5 (3) MG/3ML Soln Commonly known as: DUONEB Take 3 mLs by nebulization every 4 (four) hours as needed (wheezing, shortness of breath).   isosorbide dinitrate 30 MG tablet Commonly known as: ISORDIL Place 1 tablet (30 mg total) into feeding tube 3 (three) times daily.   levETIRAcetam 100 MG/ML solution Commonly known as: KEPPRA Place 7.5 mLs (750 mg total) into feeding tube 2 (two) times daily.   mouth rinse Liqd solution 15 mLs by Mouth Rinse route every 4 (four) hours.   pantoprazole sodium 40 mg/20 mL Susp Commonly known as: PROTONIX Place 40 mg into feeding tube daily. What changed: Another medication with the same name was removed. Continue taking this medication, and follow the directions you see here.   polyethylene  glycol powder 17 GM/SCOOP powder Commonly known as: GLYCOLAX/MIRALAX Place 1 capful  (17 g) into feeding tube daily as needed for mild constipation or moderate constipation.   scopolamine 1 MG/3DAYS Commonly known as: TRANSDERM-SCOP Place 1 patch (1.5 mg total) onto the skin every 3 (three) days.   spironolactone 25 MG tablet Commonly known as: ALDACTONE Place 1 tablet (25 mg total) into feeding tube daily.   valproic acid 250 MG/5ML solution Commonly known as: DEPAKENE Place 15 mLs (750 mg total) into feeding tube 3 (three) times daily.        Discharge Exam: Filed Weights   10/06/21 0500 10/07/21 0500 10/09/21 0214  Weight: 97.4 kg 95.5 kg 96.9 kg  BP (!) 131/97 (BP Location: Left Arm)   Pulse 83   Temp 98.5 F (36.9 C) (Axillary)   Resp 20   Ht $R'5\' 10"'ub$  (1.778 m)   Wt 96.9 kg   SpO2 95%   BMI 30.65 kg/m   Chronically ill-appearing male in no distress, not meaningfully responsive Trach with moderate, unchanged secretions, no exudate or bleeding Clear, nonlabored RRR, no pitting edema PEG site c/d/I, no grimace on abdominal palpation.    Condition at discharge: stable  The results of significant diagnostics from this hospitalization (including imaging, microbiology, ancillary and laboratory) are listed  below for reference.   Imaging Studies: ECHOCARDIOGRAM LIMITED  Result Date: 09/17/2021    ECHOCARDIOGRAM LIMITED REPORT   Patient Name:   Billy Cherry Date of Exam: 09/17/2021 Medical Rec #:  [id number]       Height:       70.0 in Accession #:    [id number]      Weight:       213.8 lb Date of Birth:  [birth year]       BSA:          2.148 m Patient Age:    67 years        BP:           146/96 mmHg Patient Gender: M               HR:           83 bpm. Exam Location:  Inpatient Procedure: Limited Echo Indications:    Cardiac arrest  History:        Patient has prior history of Echocardiogram examinations, most                 recent 03/23/2021. CHF; Risk Factors:Hypertension.  Respiratory                 failure requiring mechanical ventilation.  Sonographer:    Merrie Roof RDCS Referring Phys: A CALDWELL [place]  1. Limited study due to poor echo windows.  2. Left ventricular ejection fraction, by estimation, is 30 to 35%. The left ventricle has moderately decreased function. There is mild-to-moderate concentric left ventricular hypertrophy.  3. Right ventricular systolic function is normal. The right ventricular size is normal.  4. A small pericardial effusion is present. The pericardial effusion is posterior and lateral to the left ventricle.  5. The mitral valve is abnormal. Comparison(s): Compared to prior TTE on 03/2021, the LVEF appears improved to ~30% from <20%. There is now a small pericardial effusion present. FINDINGS  Left Ventricle: Left ventricular ejection fraction, by estimation, is 30 to 35%. The left ventricle has moderately decreased function. There is mild-to-moderate concentric left ventricular hypertrophy. Right Ventricle: The right ventricular size is normal. Right ventricular systolic function is normal. Pericardium: A small pericardial effusion is present. The pericardial effusion is posterior and lateral to the left ventricle. Mitral Valve: The mitral valve is abnormal. There is mild thickening of the mitral valve leaflet(s). There is mild calcification of the mitral valve leaflet(s). Mild to moderate mitral annular calcification. Tricuspid Valve: The tricuspid valve is normal in structure. LEFT VENTRICLE PLAX 2D LVIDd:         4.60 cm LV PW:         1.30 cm LV IVS:        1.50 cm                         3D Volume EF:                        3D EF:        46 %                        LV EDV:       204 ml                        LV ESV:  111 ml                        LV SV:        93 ml Gwyndolyn Kaufman MD Electronically signed by Gwyndolyn Kaufman MD Signature Date/Time: 09/17/2021/4:06:02 PM    Final     Microbiology: Results for orders placed  or performed during the hospital encounter of 03/30/21  Culture, Respiratory w Gram Stain     Status: None   Collection Time: 04/04/21  7:57 AM   Specimen: Tracheal Aspirate; Respiratory  Result Value Ref Range Status   Specimen Description TRACHEAL ASPIRATE  Final   Special Requests NONE  Final   Gram Stain   Final    ABUNDANT WBC PRESENT, PREDOMINANTLY PMN ABUNDANT GRAM POSITIVE COCCI IN PAIRS IN CLUSTERS ABUNDANT GRAM POSITIVE RODS RARE GRAM NEGATIVE RODS    Culture   Final    ABUNDANT STAPHYLOCOCCUS AUREUS MODERATE DIPHTHEROIDS(CORYNEBACTERIUM SPECIES) Standardized susceptibility testing for this organism is not available. Performed at [facility], [place] [address], [place], [place] [address]    Report Status 04/06/2021 FINAL  Final   Organism ID, Bacteria STAPHYLOCOCCUS AUREUS  Final      Susceptibility   Staphylococcus aureus - MIC*    CIPROFLOXACIN <=0.5 SENSITIVE Sensitive     ERYTHROMYCIN <=0.25 SENSITIVE Sensitive     GENTAMICIN <=0.5 SENSITIVE Sensitive     OXACILLIN <=0.25 SENSITIVE Sensitive     TETRACYCLINE <=1 SENSITIVE Sensitive     VANCOMYCIN <=0.5 SENSITIVE Sensitive     TRIMETH/SULFA <=10 SENSITIVE Sensitive     CLINDAMYCIN <=0.25 SENSITIVE Sensitive     RIFAMPIN <=0.5 SENSITIVE Sensitive     Inducible Clindamycin NEGATIVE Sensitive     * ABUNDANT STAPHYLOCOCCUS AUREUS  Culture, Respiratory w Gram Stain     Status: None   Collection Time: 04/10/21  8:34 AM   Specimen: Tracheal Aspirate; Respiratory  Result Value Ref Range Status   Specimen Description TRACHEAL ASPIRATE  Final   Special Requests NONE  Final   Gram Stain   Final    RARE WBC PRESENT, PREDOMINANTLY MONONUCLEAR RARE GRAM POSITIVE COCCI IN PAIRS RARE GRAM NEGATIVE RODS    Culture   Final    ABUNDANT STAPHYLOCOCCUS AUREUS ABUNDANT FLAVOBACTERIUM Power DIPHTHEROIDS(CORYNEBACTERIUM SPECIES) Standardized susceptibility testing for this organism is not available. Performed at  [facility], [place] [address] [place],  [address]    Report Status 04/13/2021 FINAL  Final   Organism ID, Bacteria STAPHYLOCOCCUS AUREUS  Final   Organism ID, Bacteria FLAVOBACTERIUM ODORATUM  Final      Susceptibility   Flavobacterium odoratum - MIC*    CEFAZOLIN >=64 RESISTANT Resistant     CEFTAZIDIME >=64 RESISTANT Resistant     CIPROFLOXACIN 1 SENSITIVE Sensitive     GENTAMICIN >=16 RESISTANT Resistant     IMIPENEM 8 INTERMEDIATE Intermediate     TRIMETH/SULFA 40 SENSITIVE Sensitive     PIP/TAZO 16 SENSITIVE Sensitive     * ABUNDANT FLAVOBACTERIUM ODORATUM   Staphylococcus aureus - MIC*    CIPROFLOXACIN <=0.5 SENSITIVE Sensitive     ERYTHROMYCIN <=0.25 SENSITIVE Sensitive     GENTAMICIN <=0.5 SENSITIVE Sensitive     OXACILLIN <=0.25 SENSITIVE Sensitive     TETRACYCLINE <=1 SENSITIVE Sensitive     VANCOMYCIN <=0.5 SENSITIVE Sensitive     TRIMETH/SULFA <=10 SENSITIVE Sensitive     CLINDAMYCIN <=0.25 SENSITIVE Sensitive     RIFAMPIN <=0.5 SENSITIVE Sensitive  Inducible Clindamycin NEGATIVE Sensitive     * ABUNDANT STAPHYLOCOCCUS AUREUS  Culture, blood (routine x 2)     Status: None   Collection Time: 04/10/21  9:00 AM   Specimen: BLOOD RIGHT HAND  Result Value Ref Range Status   Specimen Description BLOOD RIGHT HAND  Final   Special Requests   Final    BOTTLES DRAWN AEROBIC AND ANAEROBIC Blood Culture adequate volume   Culture   Final    NO GROWTH 5 DAYS Performed at [facility], [address] [place], [place] [address]    Report Status 04/15/2021 FINAL  Final  Culture, blood (routine x 2)     Status: None   Collection Time: 04/10/21  9:08 AM   Specimen: BLOOD LEFT HAND  Result Value Ref Range Status   Specimen Description BLOOD LEFT HAND  Final   Special Requests   Final    BOTTLES DRAWN AEROBIC AND ANAEROBIC Blood Culture adequate volume   Culture   Final    NO GROWTH 5 DAYS Performed at [facility], [place] [address], [place],  [place] [address]    Report Status 04/15/2021 FINAL  Final  Culture, blood (x 2)     Status: None   Collection Time: 04/20/21  8:16 AM   Specimen: BLOOD LEFT WRIST  Result Value Ref Range Status   Specimen Description BLOOD LEFT WRIST  Final   Special Requests   Final    BOTTLES DRAWN AEROBIC AND ANAEROBIC Blood Culture adequate volume   Culture   Final    NO GROWTH 5 DAYS Performed at [facility], [place] [address], [place], [place] [address]    Report Status 04/25/2021 FINAL  Final  Culture, blood (x 2)     Status: None   Collection Time: 04/20/21  8:16 AM   Specimen: BLOOD RIGHT ARM  Result Value Ref Range Status   Specimen Description BLOOD RIGHT ARM  Final   Special Requests   Final    BOTTLES DRAWN AEROBIC AND ANAEROBIC Blood Culture adequate volume   Culture   Final    NO GROWTH 5 DAYS Performed at [facility], [place] [address], [place], [place] [address]    Report Status 04/25/2021 FINAL  Final  Culture, Respiratory w Gram Stain     Status: None   Collection Time: 04/20/21 11:01 AM   Specimen: Tracheal Aspirate  Result Value Ref Range Status   Specimen Description TRACHEAL ASPIRATE  Final   Special Requests NONE  Final   Gram Stain   Final    ABUNDANT WBC PRESENT, PREDOMINANTLY PMN ABUNDANT GRAM NEGATIVE RODS FEW GRAM POSITIVE COCCI IN PAIRS Performed at [facility], [place] [address], [place], [place] [address]    Culture ABUNDANT PSEUDOMONAS AERUGINOSA  Final   Report Status 04/23/2021 FINAL  Final   Organism ID, Bacteria PSEUDOMONAS AERUGINOSA  Final      Susceptibility   Pseudomonas aeruginosa - MIC*    CEFTAZIDIME 4 SENSITIVE Sensitive     CIPROFLOXACIN <=0.25 SENSITIVE Sensitive     GENTAMICIN <=1 SENSITIVE Sensitive     IMIPENEM 2 SENSITIVE Sensitive     PIP/TAZO 8 SENSITIVE Sensitive     CEFEPIME 2 SENSITIVE Sensitive     * ABUNDANT PSEUDOMONAS AERUGINOSA  Resp Panel by RT-PCR (Flu A&B, Covid) Nasopharyngeal Swab     Status: None   Collection  Time: 04/21/21 10:27 AM   Specimen: Nasopharyngeal Swab; Nasopharyngeal(NP) swabs in vial  transport medium  Result Value Ref Range Status   SARS Coronavirus 2 by RT PCR NEGATIVE NEGATIVE Final    Comment: (NOTE) SARS-CoV-2 target nucleic acids are NOT DETECTED.  The SARS-CoV-2 RNA is generally detectable in upper respiratory specimens during the acute phase of infection. The lowest concentration of SARS-CoV-2 viral copies this assay can detect is 138 copies/mL. A negative result does not preclude SARS-Cov-2 infection and should not be used as the sole basis for treatment or other patient management decisions. A negative result may occur with  improper specimen collection/handling, submission of specimen other than nasopharyngeal swab, presence of viral mutation(s) within the areas targeted by this assay, and inadequate number of viral copies(<138 copies/mL). A negative result must be combined with clinical observations, patient history, and epidemiological information. The expected result is Negative.  Fact Sheet for Patients:  EntrepreneurPulse.com.au  Fact Sheet for Healthcare Providers:  IncredibleEmployment.be  This test is no t yet approved or cleared by the [place] FDA and  has been authorized for detection and/or diagnosis of SARS-CoV-2 by FDA under an Emergency Use Authorization (EUA). This EUA will remain  in effect (meaning this test can be used) for the duration of the COVID-19 declaration under Section 564(b)(1) of the Act, 21 U.S.C.section 360bbb-3(b)(1), unless the authorization is terminated  or revoked sooner.       Influenza A by PCR NEGATIVE NEGATIVE Final   Influenza B by PCR NEGATIVE NEGATIVE Final    Comment: (NOTE) The Xpert Xpress SARS-CoV-2/FLU/RSV plus assay is intended as an aid in the diagnosis of influenza from Nasopharyngeal swab specimens and should not be used as a sole basis for treatment. Nasal washings  and aspirates are unacceptable for Xpert Xpress SARS-CoV-2/FLU/RSV testing.  Fact Sheet for Patients: EntrepreneurPulse.com.au  Fact Sheet for Healthcare Providers: IncredibleEmployment.be  This test is not yet approved or cleared by the [place] FDA and has been authorized for detection and/or diagnosis of SARS-CoV-2 by FDA under an Emergency Use Authorization (EUA). This EUA will remain in effect (meaning this test can be used) for the duration of the COVID-19 declaration under Section 564(b)(1) of the Act, 21 U.S.C. section 360bbb-3(b)(1), unless the authorization is terminated or revoked.  Performed at [facility], [place] [address], [place], [place] [address]   Culture, Respiratory w Gram Stain     Status: None   Collection Time: 05/02/21  7:13 AM   Specimen: Tracheal Aspirate; Respiratory  Result Value Ref Range Status   Specimen Description TRACHEAL ASPIRATE  Final   Special Requests NONE  Final   Gram Stain   Final    MODERATE SQUAMOUS EPITHELIAL CELLS PRESENT ABUNDANT WBC PRESENT, PREDOMINANTLY PMN FEW GRAM NEGATIVE RODS MODERATE GRAM POSITIVE COCCI    Culture   Final    FEW PSEUDOMONAS AERUGINOSA WITHIN MIXED NORMAL RESPIRATORY FLORA Performed at [facility], [place] [address], [place], [place] [address]    Report Status 05/05/2021 FINAL  Final   Organism ID, Bacteria PSEUDOMONAS AERUGINOSA  Final      Susceptibility   Pseudomonas aeruginosa - MIC*    CEFTAZIDIME 4 SENSITIVE Sensitive     CIPROFLOXACIN <=0.25 SENSITIVE Sensitive     GENTAMICIN <=1 SENSITIVE Sensitive     IMIPENEM 2 SENSITIVE Sensitive     PIP/TAZO 8 SENSITIVE Sensitive     CEFEPIME 2 SENSITIVE Sensitive     * FEW PSEUDOMONAS AERUGINOSA  Culture, blood (routine x 2)     Status: Abnormal   Collection Time:  05/07/21 12:30 PM   Specimen: BLOOD RIGHT HAND  Result Value Ref Range Status   Specimen Description BLOOD RIGHT HAND  Final   Special  Requests   Final    BOTTLES DRAWN AEROBIC AND ANAEROBIC Blood Culture results may not be optimal due to an inadequate volume of blood received in culture bottles   Culture  Setup Time   Final    GRAM POSITIVE COCCI IN CLUSTERS IN BOTH AEROBIC AND ANAEROBIC BOTTLES CRITICAL RESULT CALLED TO, READ BACK BY AND VERIFIED WITH: PHARM D E.UELAND ON [id number] AT [id number] BY E.PARRISH    Culture (A)  Final    STAPHYLOCOCCUS EPIDERMIDIS THE SIGNIFICANCE OF ISOLATING THIS ORGANISM FROM A SINGLE SET OF BLOOD CULTURES WHEN MULTIPLE SETS ARE DRAWN IS UNCERTAIN. PLEASE NOTIFY THE MICROBIOLOGY DEPARTMENT WITHIN ONE WEEK IF SPECIATION AND SENSITIVITIES ARE REQUIRED. STAPHYLOCOCCUS CAPITIS SUSCEPTIBILITIES PERFORMED ON PREVIOUS CULTURE WITHIN THE LAST 5 DAYS. Performed at [facility], [place] [address], [place], [place] [address]    Report Status 05/10/2021 FINAL  Final  Blood Culture ID Panel (Reflexed)     Status: Abnormal   Collection Time: 05/07/21 12:30 PM  Result Value Ref Range Status   Enterococcus faecalis NOT DETECTED NOT DETECTED Final   Enterococcus Faecium NOT DETECTED NOT DETECTED Final   Listeria monocytogenes NOT DETECTED NOT DETECTED Final   Staphylococcus species DETECTED (A) NOT DETECTED Final    Comment: CRITICAL RESULT CALLED TO, READ BACK BY AND VERIFIED WITH: Vena Austria E.UELAND ON [id number] AT 1424 BY E.PARRISH    Staphylococcus aureus (BCID) NOT DETECTED NOT DETECTED Final   Staphylococcus epidermidis DETECTED (A) NOT DETECTED Final    Comment: Methicillin (oxacillin) resistant coagulase negative staphylococcus. Possible blood culture contaminant (unless isolated from more than one blood culture draw or clinical case suggests pathogenicity). No antibiotic treatment is indicated for blood  culture contaminants. CRITICAL RESULT CALLED TO, READ BACK BY AND VERIFIED WITH: Vena Austria E.UELAND ON [id number] AT 6606 BY E.PARRISH    Staphylococcus lugdunensis NOT DETECTED NOT DETECTED Final    Streptococcus species NOT DETECTED NOT DETECTED Final   Streptococcus agalactiae NOT DETECTED NOT DETECTED Final   Streptococcus pneumoniae NOT DETECTED NOT DETECTED Final   Streptococcus pyogenes NOT DETECTED NOT DETECTED Final   A.calcoaceticus-baumannii NOT DETECTED NOT DETECTED Final   Bacteroides fragilis NOT DETECTED NOT DETECTED Final   Enterobacterales NOT DETECTED NOT DETECTED Final   Enterobacter cloacae complex NOT DETECTED NOT DETECTED Final   Escherichia coli NOT DETECTED NOT DETECTED Final   Klebsiella aerogenes NOT DETECTED NOT DETECTED Final   Klebsiella oxytoca NOT DETECTED NOT DETECTED Final   Klebsiella pneumoniae NOT DETECTED NOT DETECTED Final   Proteus species NOT DETECTED NOT DETECTED Final   Salmonella species NOT DETECTED NOT DETECTED Final   Serratia marcescens NOT DETECTED NOT DETECTED Final   Haemophilus influenzae NOT DETECTED NOT DETECTED Final   Neisseria meningitidis NOT DETECTED NOT DETECTED Final   Pseudomonas aeruginosa NOT DETECTED NOT DETECTED Final   Stenotrophomonas maltophilia NOT DETECTED NOT DETECTED Final   Candida albicans NOT DETECTED NOT DETECTED Final   Candida auris NOT DETECTED NOT DETECTED Final   Candida glabrata NOT DETECTED NOT DETECTED Final   Candida krusei NOT DETECTED NOT DETECTED Final   Candida parapsilosis NOT DETECTED NOT DETECTED Final   Candida tropicalis NOT DETECTED NOT DETECTED Final   Cryptococcus neoformans/gattii NOT DETECTED NOT DETECTED Final   Methicillin resistance mecA/C DETECTED (A) NOT DETECTED Final    Comment: CRITICAL  RESULT CALLED TO, READ BACK BY AND VERIFIED WITH: Nevin Bloodgood ON [id number] AT 59 BY E.PARRISH Performed at [facility], [place] [address] [place], [place] [address]   Culture, blood (routine x 2)     Status: Abnormal   Collection Time: 05/07/21 12:37 PM   Specimen: BLOOD RIGHT HAND  Result Value Ref Range Status   Specimen Description BLOOD RIGHT HAND  Final   Special Requests    Final    BOTTLES DRAWN AEROBIC ONLY Blood Culture adequate volume   Culture  Setup Time   Final    GRAM POSITIVE COCCI IN CLUSTERS AEROBIC BOTTLE ONLY CRITICAL VALUE NOTED.  VALUE IS CONSISTENT WITH PREVIOUSLY REPORTED AND CALLED VALUE. Performed at [facility], [place] [address] [place], [place] [address]    Culture STAPHYLOCOCCUS CAPITIS (A)  Final   Report Status 05/10/2021 FINAL  Final   Organism ID, Bacteria STAPHYLOCOCCUS CAPITIS  Final      Susceptibility   Staphylococcus capitis - MIC*    CIPROFLOXACIN >=8 RESISTANT Resistant     ERYTHROMYCIN >=8 RESISTANT Resistant     GENTAMICIN 8 INTERMEDIATE Intermediate     OXACILLIN >=4 RESISTANT Resistant     TETRACYCLINE 2 SENSITIVE Sensitive     VANCOMYCIN 1 SENSITIVE Sensitive     TRIMETH/SULFA <=10 SENSITIVE Sensitive     CLINDAMYCIN >=8 RESISTANT Resistant     RIFAMPIN <=0.5 SENSITIVE Sensitive     Inducible Clindamycin NEGATIVE Sensitive     * STAPHYLOCOCCUS CAPITIS  Culture, blood (routine x 2)     Status: None   Collection Time: 05/09/21 10:45 AM   Specimen: BLOOD RIGHT WRIST  Result Value Ref Range Status   Specimen Description BLOOD RIGHT WRIST  Final   Special Requests   Final    BOTTLES DRAWN AEROBIC ONLY Blood Culture results may not be optimal due to an inadequate volume of blood received in culture bottles   Culture   Final    NO GROWTH 5 DAYS Performed at [facility], [place] [address], [place], [place] [address]    Report Status 05/14/2021 FINAL  Final  Culture, blood (routine x 2)     Status: None   Collection Time: 05/09/21 10:46 AM   Specimen: BLOOD RIGHT HAND  Result Value Ref Range Status   Specimen Description BLOOD RIGHT HAND  Final   Special Requests   Final    BOTTLES DRAWN AEROBIC AND ANAEROBIC Blood Culture adequate volume   Culture   Final    NO GROWTH 5 DAYS Performed at [facility], [place] [address] [place], [place] [address]    Report Status 05/14/2021 FINAL  Final  Culture,  blood (routine x 2)     Status: None   Collection Time: 06/06/21  6:25 AM   Specimen: BLOOD RIGHT HAND  Result Value Ref Range Status   Specimen Description BLOOD RIGHT HAND  Final   Special Requests   Final    BOTTLES DRAWN AEROBIC AND ANAEROBIC Blood Culture adequate volume   Culture   Final    NO GROWTH 5 DAYS Performed at [facility], [place] [address] [place], [place] [address]    Report Status 06/11/2021 FINAL  Final  Culture, blood (routine x 2)     Status: None   Collection Time: 06/06/21  6:38 AM   Specimen: BLOOD RIGHT HAND  Result Value Ref Range Status   Specimen Description BLOOD RIGHT HAND  Final  Special Requests   Final    BOTTLES DRAWN AEROBIC AND ANAEROBIC Blood Culture adequate volume   Culture   Final    NO GROWTH 5 DAYS Performed at [facility], [place] [address], [place], [place] [address]    Report Status 06/11/2021 FINAL  Final  Culture, Respiratory w Gram Stain     Status: None   Collection Time: 06/06/21  9:00 AM   Specimen: Tracheal Aspirate; Respiratory  Result Value Ref Range Status   Specimen Description TRACHEAL ASPIRATE  Final   Special Requests NONE  Final   Gram Stain   Final    MODERATE WBC PRESENT,BOTH PMN AND MONONUCLEAR MODERATE GRAM POSITIVE RODS MODERATE GRAM NEGATIVE RODS RARE GRAM POSITIVE COCCI IN CHAINS    Culture   Final    FEW Consistent with normal respiratory flora. No Pseudomonas species isolated Performed at [place] [address], [place], [place] [address]    Report Status 06/08/2021 FINAL  Final  Culture, blood (Routine X 2) w Reflex to ID Panel     Status: None   Collection Time: 06/13/21  8:58 AM   Specimen: BLOOD RIGHT HAND  Result Value Ref Range Status   Specimen Description BLOOD RIGHT HAND  Final   Special Requests   Final    BOTTLES DRAWN AEROBIC AND ANAEROBIC Blood Culture results may not be optimal due to an inadequate volume of blood received in culture bottles   Culture   Final     NO GROWTH 5 DAYS Performed at [facility], [place] [address], [place], [place] [address]    Report Status 06/18/2021 FINAL  Final  Culture, blood (single) w Reflex to ID Panel     Status: None   Collection Time: 06/17/21 11:13 AM   Specimen: BLOOD LEFT ARM  Result Value Ref Range Status   Specimen Description BLOOD LEFT ARM  Final   Special Requests   Final    BOTTLES DRAWN AEROBIC AND ANAEROBIC Blood Culture adequate volume   Culture   Final    NO GROWTH 5 DAYS Performed at [facility], [place] [address] [place], [place] [address]    Report Status 06/22/2021 FINAL  Final  Culture, blood (Routine X 2) w Reflex to ID Panel     Status: None   Collection Time: 08/15/21  3:11 PM   Specimen: BLOOD LEFT HAND  Result Value Ref Range Status   Specimen Description BLOOD LEFT HAND  Final   Special Requests   Final    BOTTLES DRAWN AEROBIC AND ANAEROBIC Blood Culture adequate volume   Culture   Final    NO GROWTH 5 DAYS Performed at [facility], [place] [address] [place], [place] [address]    Report Status 08/20/2021 FINAL  Final  Culture, blood (Routine X 2) w Reflex to ID Panel     Status: None   Collection Time: 08/15/21  3:12 PM   Specimen: BLOOD  Result Value Ref Range Status   Specimen Description BLOOD RIGHT ANTECUBITAL  Final   Special Requests   Final    BOTTLES DRAWN AEROBIC AND ANAEROBIC Blood Culture adequate volume   Culture   Final    NO GROWTH 5 DAYS Performed at [facility], [place] [address] [place], [place] [address]    Report Status 08/20/2021 FINAL  Final  Culture, blood (Routine X 2) w Reflex to ID Panel     Status: None  Collection Time: 08/22/21  4:30 AM   Specimen: BLOOD LEFT HAND  Result Value Ref Range Status   Specimen Description BLOOD LEFT HAND  Final   Special Requests   Final    BOTTLES DRAWN AEROBIC AND ANAEROBIC Blood Culture results may not be optimal due to an excessive volume of blood received in culture bottles   Culture    Final    NO GROWTH 5 DAYS Performed at [facility], [place] [address] [place], [place] [address]    Report Status 08/27/2021 FINAL  Final  Culture, blood (Routine X 2) w Reflex to ID Panel     Status: Abnormal   Collection Time: 08/22/21  4:36 AM   Specimen: BLOOD RIGHT HAND  Result Value Ref Range Status   Specimen Description BLOOD RIGHT HAND  Final   Special Requests   Final    BOTTLES DRAWN AEROBIC AND ANAEROBIC Blood Culture results may not be optimal due to an excessive volume of blood received in culture bottles   Culture  Setup Time   Final    GRAM POSITIVE COCCI AEROBIC BOTTLE ONLY CRITICAL RESULT CALLED TO, READ BACK BY AND VERIFIED WITH: PHARMD JAMES LEDFORD 08/23/21@5 :15 BY TW    Culture (A)  Final    STAPHYLOCOCCUS CAPITIS THE SIGNIFICANCE OF ISOLATING THIS ORGANISM FROM A SINGLE SET OF BLOOD CULTURES WHEN MULTIPLE SETS ARE DRAWN IS UNCERTAIN. PLEASE NOTIFY THE MICROBIOLOGY DEPARTMENT WITHIN ONE WEEK IF SPECIATION AND SENSITIVITIES ARE REQUIRED. Performed at [facility], [place] [address], [place], [place] [address]    Report Status 08/25/2021 FINAL  Final  Blood Culture ID Panel (Reflexed)     Status: Abnormal   Collection Time: 08/22/21  4:36 AM  Result Value Ref Range Status   Enterococcus faecalis NOT DETECTED NOT DETECTED Final   Enterococcus Faecium NOT DETECTED NOT DETECTED Final   Listeria monocytogenes NOT DETECTED NOT DETECTED Final   Staphylococcus species DETECTED (A) NOT DETECTED Final    Comment: CRITICAL RESULT CALLED TO, READ BACK BY AND VERIFIED WITH: PHARMD JAMES LEDFORD 08/23/21@5 :15 BY TW    Staphylococcus aureus (BCID) NOT DETECTED NOT DETECTED Final   Staphylococcus epidermidis NOT DETECTED NOT DETECTED Final   Staphylococcus lugdunensis NOT DETECTED NOT DETECTED Final   Streptococcus species NOT DETECTED NOT DETECTED Final   Streptococcus agalactiae NOT DETECTED NOT DETECTED Final   Streptococcus pneumoniae NOT DETECTED NOT DETECTED  Final   Streptococcus pyogenes NOT DETECTED NOT DETECTED Final   A.calcoaceticus-baumannii NOT DETECTED NOT DETECTED Final   Bacteroides fragilis NOT DETECTED NOT DETECTED Final   Enterobacterales NOT DETECTED NOT DETECTED Final   Enterobacter cloacae complex NOT DETECTED NOT DETECTED Final   Escherichia coli NOT DETECTED NOT DETECTED Final   Klebsiella aerogenes NOT DETECTED NOT DETECTED Final   Klebsiella oxytoca NOT DETECTED NOT DETECTED Final   Klebsiella pneumoniae NOT DETECTED NOT DETECTED Final   Proteus species NOT DETECTED NOT DETECTED Final   Salmonella species NOT DETECTED NOT DETECTED Final   Serratia marcescens NOT DETECTED NOT DETECTED Final   Haemophilus influenzae NOT DETECTED NOT DETECTED Final   Neisseria meningitidis NOT DETECTED NOT DETECTED Final   Pseudomonas aeruginosa NOT DETECTED NOT DETECTED Final   Stenotrophomonas maltophilia NOT DETECTED NOT DETECTED Final   Candida albicans NOT DETECTED NOT DETECTED Final   Candida auris NOT DETECTED NOT DETECTED Final   Candida glabrata NOT DETECTED NOT DETECTED Final   Candida krusei NOT DETECTED NOT DETECTED Final   Candida parapsilosis NOT  DETECTED NOT DETECTED Final   Candida tropicalis NOT DETECTED NOT DETECTED Final   Cryptococcus neoformans/gattii NOT DETECTED NOT DETECTED Final    Comment: Performed at [facility], [place] [address] [place], [place] [address]  Culture, blood (Routine X 2) w Reflex to ID Panel     Status: None   Collection Time: 08/23/21  8:52 AM   Specimen: BLOOD LEFT HAND  Result Value Ref Range Status   Specimen Description BLOOD LEFT HAND  Final   Special Requests AEROBIC BOTTLE ONLY Blood Culture adequate volume  Final   Culture   Final    NO GROWTH 5 DAYS Performed at [facility], [place] [address] [place], [place] [address]    Report Status 08/28/2021 FINAL  Final  Culture, blood (Routine X 2) w Reflex to ID Panel     Status: Abnormal   Collection Time: 08/23/21  9:11 AM    Specimen: BLOOD LEFT HAND  Result Value Ref Range Status   Specimen Description BLOOD LEFT HAND  Final   Special Requests IN PEDIATRIC BOTTLE Blood Culture adequate volume  Final   Culture  Setup Time   Final    GRAM POSITIVE COCCI IN CLUSTERS IN PEDIATRIC BOTTLE CRITICAL RESULT CALLED TO, READ BACK BY AND VERIFIED WITH: PHARMD E MARTIN [id number] AT 34 AM BYCM    Culture (A)  Final    STAPHYLOCOCCUS EPIDERMIDIS THE SIGNIFICANCE OF ISOLATING THIS ORGANISM FROM A SINGLE SET OF BLOOD CULTURES WHEN MULTIPLE SETS ARE DRAWN IS UNCERTAIN. PLEASE NOTIFY THE MICROBIOLOGY DEPARTMENT WITHIN ONE WEEK IF SPECIATION AND SENSITIVITIES ARE REQUIRED. Performed at [facility], [place] [address] [place], [place] [address]    Report Status 08/25/2021 FINAL  Final    Labs: CBC: Recent Labs  Lab 10/04/21 2347  WBC 9.5  HGB 13.4  HCT 40.8  MCV 93.6  PLT 824   Basic Metabolic Panel: Recent Labs  Lab 10/04/21 2347  NA 134*  K 5.0  CL 100  CO2 26  GLUCOSE 89  BUN 30*  CREATININE 0.77  CALCIUM 9.5   Liver Function Tests: No results for input(s): "AST", "ALT", "ALKPHOS", "BILITOT", "PROT", "ALBUMIN" in the last 168 hours. CBG: Recent Labs  Lab 10/08/21 0811  GLUCAP 102*    Discharge time spent: greater than 30 minutes.  Signed: Patrecia Pour, MD [facility] 10/09/2021

## 2021-10-10 DIAGNOSIS — G931 Anoxic brain damage, not elsewhere classified: Secondary | ICD-10-CM | POA: Diagnosis not present

## 2021-10-10 NOTE — Progress Notes (Addendum)
  Progress Note  Patient: Billy Cherry [id number]:[id number] DOB: [birth year]  DOA: 10/04/2021  DOS: 10/10/2021    Brief hospital course: Treshon Stannard is a 51 y.o. 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  He remains medically stable. there have been issues with disposition that CSW and CM are working diligently on.  Will check routine labs weekly (tomorrow).  Assessment and Plan: * Anoxic brain injury ([name]) Tracheostomy and PEG tube dependent Poor neurological recovery after cardiac arrest with persistent vegetative state.  Discharge to home 10/04/2021 however readmitted same day due to no home health services. -TOC consult -Continue trach and PEG tube care  History of cardiac arrest In January 2023.  Thought secondary to primary V-fib arrest with question of ACE inhibitor induced angioedema.  Chronic respiratory failure with hypoxia ([name]) Trach dependent, saturating well on 5 L O2 via trach collar.  Pulmonary embolus ([name]) Continue Eliquis.  DVT (deep venous thrombosis) ([name]) Lower extremity venous ultrasound 05/15/2021 showed age-indeterminate DVT involving left femoral, left proximal profunda, left posterior tibial, left popliteal, and left peroneal veins. -Continue Eliquis  Dysphagia PEG dependent.  Continue meds and tube feeding through PEG.  HTN (hypertension) Continue Coreg, Cardura, hydralazine, Isordil, spironolactone.  Chronic systolic CHF (congestive heart failure) ([name]) Most recent EF 30-35% by TTE 09/17/2021. -Continue Coreg, hydralazine, Isordil, spironolactone -Strict I/O's and daily weights  Myoclonus: None currently noted.  - Continue antiepileptic therapy with  Klonopin, Keppra, valproate.  Obesity: Estimated body mass index is 30.81 kg/m as calculated from the following:   Height as of this encounter: 5\' 10"  (1.778 m).   Weight as of this encounter: 97.4 kg.  Subjective: Pt unchanged, not responsive. No overnight events reported.   Objective: Vitals:   10/10/21 0500 10/10/21 0750 10/10/21 0900 10/10/21 1101  BP:   (!) 125/114   Pulse:  89 96 99  Resp:  18 18 20   Temp:   98.6 F (37 C)   TempSrc:   Oral   SpO2:  97% 93% 95%  Weight: 97.4 kg     Height:       Gen: 51 y.o. male in no distress Neck: No hemorrhage from trach Pulm: Nonlabored. Upper airway secretions head with resolution after suctioning, no wheezes or crackles. CV: Regular rate and rhythm. No murmur, rub, or gallop. No JVD, no pitting dependent edema. GI: Abdomen soft, non-tender, non-distended, with normoactive bowel sounds. PEG site c/d/i Ext: Warm, dry, flaccid Skin: No acute rashes, lesions or ulcers on visualized skin. Neuro: Moves but not to command.   Data Personally reviewed: CBC: Recent Labs  Lab 10/04/21 2347  WBC 9.5  HGB 13.4  HCT 40.8  MCV 93.6  PLT 229   Basic Metabolic Panel: Recent Labs  Lab 10/04/21 2347  NA 134*  K 5.0  CL 100  CO2 26  GLUCOSE 89  BUN 30*  CREATININE 0.77  CALCIUM 9.5   12/04/21, MD 10/10/2021 12:11 PM Page by [email or web address]

## 2021-10-11 DIAGNOSIS — G931 Anoxic brain damage, not elsewhere classified: Secondary | ICD-10-CM | POA: Diagnosis not present

## 2021-10-11 LAB — CBC
HCT: 39 % (ref 39.0–52.0)
Hemoglobin: 12.7 g/dL — ABNORMAL LOW (ref 13.0–17.0)
MCH: 30.9 pg (ref 26.0–34.0)
MCHC: 32.6 g/dL (ref 30.0–36.0)
MCV: 94.9 fL (ref 80.0–100.0)
Platelets: 211 10*3/uL (ref 150–400)
RBC: 4.11 MIL/uL — ABNORMAL LOW (ref 4.22–5.81)
RDW: 15.5 % (ref 11.5–15.5)
WBC: 6.5 10*3/uL (ref 4.0–10.5)
nRBC: 0 % (ref 0.0–0.2)

## 2021-10-11 LAB — BASIC METABOLIC PANEL
Anion gap: 9 (ref 5–15)
BUN: 28 mg/dL — ABNORMAL HIGH (ref 6–20)
CO2: 29 mmol/L (ref 22–32)
Calcium: 9.3 mg/dL (ref 8.9–10.3)
Chloride: 97 mmol/L — ABNORMAL LOW (ref 98–111)
Creatinine, Ser: 0.67 mg/dL (ref 0.61–1.24)
GFR, Estimated: >60 mL/min (ref >60)
Glucose, Bld: 84 mg/dL (ref 70–99)
Potassium: 4.6 mmol/L (ref 3.5–5.1)
Sodium: 135 mmol/L (ref 135–145)

## 2021-10-11 LAB — GLUCOSE, CAPILLARY: Glucose-Capillary: 81 mg/dL (ref 70–99)

## 2021-10-11 MED ORDER — PROSOURCE TF20 ENFIT COMPATIBL EN LIQD
60.0000 mL | Freq: Two times a day (BID) | ENTERAL | Status: DC
  Administered 2021-10-11 – 2021-10-24 (×27): 60 mL
  Filled 2021-10-11 (×29): qty 60

## 2021-10-11 NOTE — Progress Notes (Signed)
  Progress Note  Patient: Billy Cherry [id number]:[id number] DOB: 1970-04-21  DOA: 10/04/2021  DOS: 10/11/2021    Brief hospital course: Billy Cherry is a 51 y.o. 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  He remains medically stable. there have been issues with disposition that CSW and CM are working diligently on. Routine labs on 8/9 are stable.  Assessment and Plan: * Anoxic brain injury ([name]) Tracheostomy and PEG tube dependent Poor neurological recovery after cardiac arrest with persistent vegetative state.  Discharge to home 10/04/2021 however readmitted same day due to no home health services. -TOC consult -Continue trach and PEG tube care  History of cardiac arrest In January 2023.  Thought secondary to primary V-fib arrest with question of ACE inhibitor induced angioedema.  Chronic respiratory failure with hypoxia ([name]) Trach dependent, saturating well on 5 L O2 via trach collar.  Pulmonary embolus ([name]) Continue Eliquis.  DVT (deep venous thrombosis) ([name]) Lower extremity venous ultrasound 05/15/2021 showed age-indeterminate DVT involving left femoral, left proximal profunda, left posterior tibial, left popliteal, and left peroneal veins. -Continue Eliquis  Dysphagia PEG dependent.  Continue meds and tube feeding through PEG.  HTN (hypertension) Continue Coreg, Cardura, hydralazine, Isordil, spironolactone.  Chronic systolic CHF (congestive heart failure) ([name]) Most recent EF 30-35% by TTE 09/17/2021. -Continue Coreg, hydralazine, Isordil, spironolactone -Strict I/O's and daily weights  Myoclonus: None currently noted.  - Continue antiepileptic therapy with Klonopin, Keppra,  valproate.  Obesity: Estimated body mass index is 30.81 kg/m as calculated from the following:   Height as of this encounter: 5\' 10"  (1.778 m).   Weight as of this encounter: 97.4 kg.  Subjective: No overnight events reported.  Objective: Vitals:   10/11/21 0344 10/11/21 0802 10/11/21 0830 10/11/21 1211  BP:  (!) 156/96    Pulse: 80 83 89 92  Resp: 18 13 20 18   Temp:  98.1 F (36.7 C)    TempSrc:      SpO2: 96% 99% 96% 97%  Weight:      Height:      Gen: 51 y.o. male in no distress Pulm: Nonlabored thru trach. Clear. CV: Regular rate and rhythm. No murmur, rub, or gallop. No JVD, trace diffuse edema. GI: Abdomen soft, non-tender, non-distended, with normoactive bowel sounds.  Ext: Warm, no deformities Skin: PEG site c/d/I without rashes, lesions or ulcers on visualized skin. Neuro: Nonverbal, does not track or move extremities.   Data Personally reviewed: CBC: Recent Labs  Lab 10/04/21 2347 10/11/21 0529  WBC 9.5 6.5  HGB 13.4 12.7*  HCT 40.8 39.0  MCV 93.6 94.9  PLT 229 211   Basic Metabolic Panel: Recent Labs  Lab 10/04/21 2347 10/11/21 0529  NA 134* 135  K 5.0 4.6  CL 100 97*  CO2 26 29  GLUCOSE 89 84  BUN 30* 28*  CREATININE 0.77 0.67  CALCIUM 9.5 9.3   12/04/21, MD 10/11/2021 1:13 PM Page by [email or web address]

## 2021-10-11 NOTE — Plan of Care (Signed)
  Problem: Clinical Measurements: Goal: Ability to maintain clinical measurements within normal limits will improve Outcome: Not Progressing Goal: Will remain free from infection Outcome: Not Progressing Goal: Diagnostic test results will improve Outcome: Not Progressing Goal: Respiratory complications will improve Outcome: Not Progressing Goal: Cardiovascular complication will be avoided Outcome: Not Progressing   Problem: Nutrition: Goal: Adequate nutrition will be maintained Outcome: Not Progressing

## 2021-10-11 NOTE — TOC Progression Note (Signed)
Transition of Care [name]) - Progression Note    Patient Details  Name: Billy Cherry MRN: [id number] Date of Birth: 1970/09/15  Transition of Care [facility]) CM/SW Contact  Janae Bridgeman, RN Phone Number: 10/11/2021, 11:32 AM  Clinical Narrative:    The patient's wife, Ms. Billy Cherry called for an update regarding availability of Private duty nursing through [facility] home health agency.  I updated the wife that Edwin Dada, MSW has spoke with Verdell Face, CM with [facility] and [facility] and submitted for insurance authorization to cover up to 112 hours of private duty skilled nursing availability in the home.  The patient's wife states that she would like the patient to return home with private duty nursing and not be placed in a skilled nursing facility.  I updated the wife that once insurance authorization is completed through the agency and agency has staff availability in the home that care will be coordinated with [facility] and the wife to transport the patient home - no discharge date has been determined at this time.  I spoke with the patient's wife in depth on the phone regarding her providing care to the patient in the home and she states that she is comfortable providing PEG tube care, medication administration and tube feedings but would like a refresher from bedside nursing before patient is discharged home.  Nursing order placed for refresher for PEG tube and medication administration technique in the order.  Atha Starks, RN charge nurse is aware and will update staff to refresh the wife regarding this teaching.  The patient's wife states that she is comfortable with tracheal suctioning but would like a refresher through [place] team in regards to changing the inner cannula.  A voicemail message was left with Ruthell Rummage, RT Supervisor with trach team to call the wife this week and arrange for refresher regarding trach teaching.  DME - The patient will need to be sent home with  5 days of Trach inner cannulas as requested by [employer].  [employer] has provided all DME for the home and all DME is currently at the home at this time including hospital bed, trach supplies, suction supplies and tube feeding supplies.  Only needed DME is 5 days of inner cannulas for trach to be sent home with the patient on the day of discharge.    Transportation will be arranged through [employer] transportation once a discharge date has been established.  Bedside nursing  - to call the wife and arrange for bedside teaching for both trach and PEG care - Atha Starks, RN Charge RN aware.  CM and MSW with [facility] Team will continue to follow the patient for coordination of care to home - no discharge date determined at this time.   Expected Discharge Plan: Home w Home Health Services Barriers to Discharge: Family Issues  Expected Discharge Plan and Services Expected Discharge Plan: Home w Home Health Services In-house Referral: Clinical Social Work, Nutrition, Artist Discharge Planning Services: CM Consult Post Acute Care Choice: Home Health Living arrangements for the past 2 months: Single Family Home Expected Discharge Date: 10/09/21               DME Arranged: Janina Mayo supplies, Tube feeding, Tube feeding pump, Oxygen, Hospital bed, Suction DME Agency: [employer] Date DME Agency Contacted: 10/05/21 Time DME Agency Contacted: [phone]               Social Determinants of Health (SDOH) Interventions    Readmission Risk Interventions  04/14/2021   11:14 AM  Readmission Risk Prevention Plan  Transportation Screening Complete  HRI or Home Care Consult Complete  Social Work Consult for Recovery Care Planning/Counseling Complete  Palliative Care Screening Complete  Medication Review Ocean[employer]) Complete

## 2021-10-12 DIAGNOSIS — G931 Anoxic brain damage, not elsewhere classified: Secondary | ICD-10-CM | POA: Diagnosis not present

## 2021-10-12 NOTE — TOC Progression Note (Signed)
Transition of Care [facility]) - Progression Note    Patient Details  Name: Billy Cherry MRN: [id number] Date of Birth: May 25, 1970  Transition of Care [facility]) CM/SW Contact  Janae Bridgeman, RN Phone Number: 10/12/2021, 11:34 AM  Clinical Narrative:    Ruthell Rummage, RT from [facility] Team arrived on the unit to meet with the wife, [name] for follow up teaching regarding trach care.  The wife was not present on the unit at this time.  I called and was unable to reach the patient's wife by phone but left a voicemail message on her phone.  [place], RT states that she will follow up with the patient's wife.   Expected Discharge Plan: Home w Home Health Services Barriers to Discharge: Family Issues  Expected Discharge Plan and Services Expected Discharge Plan: Home w Home Health Services In-house Referral: Clinical Social Work, Nutrition, Artist Discharge Planning Services: CM Consult Post Acute Care Choice: Home Health Living arrangements for the past 2 months: Single Family Home Expected Discharge Date: 10/09/21               DME Arranged: Janina Mayo supplies, Tube feeding, Tube feeding pump, Oxygen, Hospital bed, Suction DME Agency: [employer] Date DME Agency Contacted: 10/05/21 Time DME Agency Contacted: [phone]               Social Determinants of Health (SDOH) Interventions    Readmission Risk Interventions    04/14/2021   11:14 AM  Readmission Risk Prevention Plan  Transportation Screening Complete  HRI or Home Care Consult Complete  Social Work Consult for Recovery Care Planning/Counseling Complete  Palliative Care Screening Complete  Medication Review Oceanographer) Complete

## 2021-10-12 NOTE — Progress Notes (Signed)
  Progress Note  Patient: Billy Cherry VZS:[id number] DOB: 1971/01/27  DOA: 10/04/2021  DOS: 10/12/2021    Brief hospital course: Billy Cherry is a 51 y.o. 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  He remains medically stable. there have been issues with disposition that CSW and CM are working diligently on. Routine labs on 8/9 are stable.  8/10 RT from trach team teaching wife about trach care. No overnight issues  Assessment and Plan: * Anoxic brain injury ([name]) Tracheostomy and PEG tube dependent Poor neurological recovery after cardiac arrest with persistent vegetative state.  Discharge to home 10/04/2021 however readmitted same day due to no home health services. 8/10 TOC working on [facility] Team teaching wife about trach care Continue peg care     History of cardiac arrest In January 2023.  Thought secondary to primary V-fib arrest with question of ACE inhibitor induced angioedema. 8/10acute issues here Continue beta blk and isosorbide  Chronic respiratory failure with hypoxia ([name]) Trach dependent, on 5L via trach collar   Pulmonary embolus ([name]) Continue on eliquis  DVT (deep venous thrombosis) ([name]) Lower extremity venous ultrasound 05/15/2021 showed age-indeterminate DVT involving left femoral, left proximal profunda, left posterior tibial, left popliteal, and left peroneal veins. -Continue Eliquis  Dysphagia PEG dependent.  Continue meds and tube feeding through PEG.  HTN (hypertension) Continue Coreg, Cardura, hydralazine, Isordil, spironolactone.  Chronic systolic CHF (congestive heart failure) ([name]) Most recent EF 30-35% by TTE 09/17/2021. -Continue  Coreg, hydralazine, Isordil, spironolactone -Strict I/O's and daily weights  Myoclonus: None currently noted.  - Continue antiepileptic therapy with Klonopin, Keppra, valproate.  Obesity: Estimated body mass index is 30.81 kg/m as calculated from the following:   Height as of this encounter: 5\' 10"  (1.778 m).   Weight as of this encounter: 97.4 kg.  Subjective  no overnight issues  Objective: Vitals:   10/12/21 0900 10/12/21 1131 10/12/21 1400 10/12/21 1634  BP: (!) 168/98  (!) 139/103   Pulse:  93 91 90  Resp: 18 18  (!) 22  Temp:      TempSrc:      SpO2:  96%  95%  Weight:      Height:        Calm, nad Decrease bs, no wheezing Reg s1/s2 no gallop Soft benign +bs Trace edema LE Unable to assess  Mood and affect appropriate in current setting    Data Personally reviewed: CBC: Recent Labs  Lab 10/11/21 0529  WBC 6.5  HGB 12.7*  HCT 39.0  MCV 94.9  PLT 211   Basic Metabolic Panel: Recent Labs  Lab 10/11/21 0529  NA 135  K 4.6  CL 97*  CO2 29  GLUCOSE 84  BUN 28*  CREATININE 0.67  CALCIUM 9.3    Time spent: 35 min   12/11/21, MD 10/12/2021 6:00 PM Page by [email or web address]

## 2021-10-13 DIAGNOSIS — G931 Anoxic brain damage, not elsewhere classified: Secondary | ICD-10-CM | POA: Diagnosis not present

## 2021-10-13 NOTE — Plan of Care (Signed)

## 2021-10-13 NOTE — Progress Notes (Addendum)
[facility] PROGRESS NOTE  Billy Cherry [id number]:[id number] DOB: 1971-01-21 DOA: 10/04/2021 PCP: Patient, No Pcp Per  Status: Remains inpatient appropriate because: Unsafe discharge plan.  Waiting for availability of all home health services including RT and nursing care to be in place prior to discharge,  Barriers to discharge: Social: Waiting on establishment of all home care services before patient sent back to the home environment Clinical: None  Level of care:  Med-Surg   Code Status: Full Family Communication: Wife not at bedside DVT prophylaxis: Eliquis  HPI: 51 y.o. 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   He remains medically stable. there have been issues with disposition that CSW and CM are working diligently on. Routine labs on 8/9 are stable.  Well-known to me from previous admission.   Subjective: Unresponsive/nonverbal both to tactile and verbal stimulation.  He does resist attempts to open his eyelids for pupil check  Objective: Vitals:   10/13/21 0013 10/13/21 0316  BP:    Pulse: 95 83  Resp: 16 16  Temp:    SpO2: 93% 98%    Intake/Output Summary (Last 24 hours) at 10/13/2021 0742 Last data filed at 10/13/2021 0631 Gross per 24 hour  Intake 710 ml  Output 1100 ml  Net -390 ml   Filed Weights   10/09/21 0214 10/10/21 0500 10/13/21 0500  Weight: 96.9 kg 97.4 kg 95.3 kg    Exam:  Constitutional: NAD, calm, comfortable Respiratory: clear to auscultation bilaterally, no wheezing, no crackles. Normal respiratory effort. No accessory muscle use.  Cardiovascular: Cuffed tracheostomy tube in place.  Regular rate and rhythm, no murmurs / rubs / gallops.   Soft nonpitting bilateral upper extremity edema.  No edema lower extremities.. 2+ pedal pulses. No carotid bruits.  Abdomen: no tenderness, no masses palpated. No hepatosplenomegaly. Bowel sounds positive.  Musculoskeletal: no clubbing / cyanosis. No joint deformity upper and lower extremities. Good ROM, no contractures.  Offloading boots to bilateral lower extremities Neurologic: CN 2-12 appear to be grossly intact on visual inspection.  It is mentation precludes testing given his inability to participate.  Sensation appears to be intact, unable to test strength Psychiatric: Unresponsive and nonverbal   Assessment/Plan: * Anoxic brain injury ([name]) -Poor neurological recovery after cardiac arrest with persistent vegetative state.  Discharge to home 10/04/2021 however readmitted same day due to no home health services. -TOC consult-once confirmed that all home health services not only available but actually in place and everything has arrived to the home then patient can discharge back to the home environment   History of cardiac arrest In January 2023.  Thought secondary to primary V-fib arrest with question of ACE inhibitor induced angioedema.   Chronic respiratory failure with hypoxia now requiring chronic tracheostomy to [facility]) Trach dependent, saturating well on 5 L O2 via trach collar. Has chronic pulmonary congestion and rhonchi secondary to bedbound state   Pulmonary embolus ([name]) Continue Eliquis.   DVT (deep venous thrombosis) ([name]) Lower extremity venous ultrasound 05/15/2021 showed age-indeterminate DVT involving left femoral, left proximal profunda, left posterior tibial, left popliteal, and left peroneal veins. -Continue Eliquis   Dysphagia chronic PEG tube PEG dependent.  Continue meds and tube feeding through PEG.   HTN (hypertension) Continue Coreg,  Cardura, hydralazine, Isordil, spironolactone.   Chronic systolic CHF (congestive heart failure) ([name]) Most recent EF 30-35%  by TTE 09/17/2021. -Continue Coreg, hydralazine, Isordil, spironolactone -Strict I/O's and daily weights -Current secretions are yellow and thick and not consistent with volume overload   Myoclonus:  None currently noted.  - Continue antiepileptic therapy with Klonopin, Keppra, valproate.   Obesity:  Estimated body mass index is 30.81 kg/m as calculated from the following:   Height as of this encounter: 5\' 10"  (1.778 m).   Weight as of this encounter: 97.4 kg.   Data Reviewed: Basic Metabolic Panel: Recent Labs  Lab 10/11/21 0529  NA 135  K 4.6  CL 97*  CO2 29  GLUCOSE 84  BUN 28*  CREATININE 0.67  CALCIUM 9.3   Liver Function Tests: No results for input(s): "AST", "ALT", "ALKPHOS", "BILITOT", "PROT", "ALBUMIN" in the last 168 hours. No results for input(s): "LIPASE", "AMYLASE" in the last 168 hours. No results for input(s): "AMMONIA" in the last 168 hours. CBC: Recent Labs  Lab 10/11/21 0529  WBC 6.5  HGB 12.7*  HCT 39.0  MCV 94.9  PLT 211   Cardiac Enzymes: No results for input(s): "CKTOTAL", "CKMB", "CKMBINDEX", "TROPONINI" in the last 168 hours. BNP (last 3 results) Recent Labs    06/06/21 0625  BNP 60.3    ProBNP (last 3 results) No results for input(s): "PROBNP" in the last 8760 hours.  CBG: Recent Labs  Lab 10/08/21 0811 10/08/21 1220  GLUCAP 102* 81    No results found for this or any previous visit (from the past 240 hour(s)).   Studies: No results found.  Scheduled Meds:  apixaban  5 mg Per Tube BID   carvedilol  25 mg Per Tube BID WC   clonazePAM  0.5 mg Per Tube BID   doxazosin  2 mg Per Tube Daily   feeding supplement (OSMOLITE 1.5 CAL)  355 mL Per Tube QID   feeding supplement (PROSource TF20)  60 mL Per Tube BID   fiber  1 packet Per Tube BID   free water  200 mL Per Tube Q6H   glycopyrrolate  1 mg Per Tube TID   hydrALAZINE  50 mg Per Tube TID   isosorbide dinitrate  30 mg Per Tube TID   levETIRAcetam  750 mg Per Tube BID    mouth rinse  15 mL Mouth Rinse Q4H   pantoprazole  40 mg Per Tube Daily   scopolamine  1 patch Transdermal Q72H   sodium chloride flush  3 mL Intravenous Q12H   spironolactone  25 mg Per Tube Daily   valproic acid  750 mg Per Tube TID   Continuous Infusions:  Principal Problem:   Anoxic brain injury ([name]) Active Problems:   History of cardiac arrest   Chronic respiratory failure with hypoxia ([name])   Myoclonus   Chronic systolic CHF (congestive heart failure) ([name])   HTN (hypertension)   Dysphagia   DVT (deep venous thrombosis) ([name])   Pulmonary embolus ([name])   Tracheostomy dependent ([name])   S/P percutaneous endoscopic gastrostomy (PEG) tube placement ([name])   Consultants: None  Procedures: Echocardiogram  Antibiotics: None    Time spent: 35 minutes    12/08/21 ANP  [facility] 7 am - 330 pm/M-F for direct patient care and secure chat Please refer to Amion for contact info 9  days   Addendum:  Attending MD note  Patient was seen, examined, treatment plan was discussed with Ms. Junious Silk, APP.  I have personally reviewed the clinical findings, lab,EKG, imaging studies and management of this patient in detail.I have also reviewed the orders written for this patient which were under my direction. I agree with the documentation, as recorded by the Advance Practice Provider.   Briefly 51 year old male with recent prolonged hospital stay due to V-fib cardiac arrest and respiratory arrest that resulted in anoxic brain injury with persistent vegetative state, s/p trach and PEG, chronic systolic CHF, EF 30 to 35%, PE/DVT on Eliquis, myoclonic seizures readmitted on the same day after discharge due to lack of home health care.  BP (!) 156/107 (BP Location: Left Arm)   Pulse 90   Temp 98.1 F (36.7 C)   Resp 18   Ht 5\' 10"  (1.778 m)   Wt 95.3 kg   SpO2 94%   BMI 30.15 kg/m  Labs and imagings reviewed by myself. Physical Exam General: Unresponsive, NAD,  appears comfortable, trach in place Cardiovascular: S1 S2 clear, RRR.  Respiratory: CTAB, no wheezing, rales Gastrointestinal: Soft, ND, NBS, PEG Ext: no pedal edema bilaterally Neuro: does not follow any commands, unable to test strength    Agree with assessment and plan, discussed in detail with Ms. .  Anoxic brain injury with a history of V-fib arrest and respiratory arrest in January 2023 -Patient was discharged to home on 8/2 however readmitted due to no home health services.  Trach and PEG dependent. -TOC diligently assisting, agency will begin services on 10/19/2021 and transportation has been arranged on 8/17 for discharge.  9/17 M.D. [facility] 10/13/2021, 12:10 PM

## 2021-10-13 NOTE — Progress Notes (Addendum)
CSW spoke with Vance Gather at [facility] who states the agency can begin services on Thurdsay, August 17th at 11am. Vance Gather will speak with patient's wife to inform her of information.  CSW has arranged for transportation via [employer] with a pick up time of 10:30am on 10/19/2021.  Edwin Dada, MSW, LCSW Transitions of Care  Clinical Social Worker II [phone]

## 2021-10-13 NOTE — TOC Progression Note (Signed)
Transition of Care [name]) - Progression Note    Patient Details  Name: Billy Cherry MRN: [id number] Date of Birth: 07/27/1970  Transition of Care [facility]) CM/SW Contact  Janae Bridgeman, RN Phone Number: 10/13/2021, 2:31 PM  Clinical Narrative:    CM spoke with Fabian November, Nursing Secretary is ordering a box of tracheostomy inner cannulas to place at the bedside and send home with the patient upon discharge to home next week.  Claudette Laws, MSW is aware with DTP Team and will continue to follow the patient for discharge planning to home.  No family members are present at the bedside today.  I called and left a message with the patient's wife, but have not received a return call at this time.   Expected Discharge Plan: Home w Home Health Services Barriers to Discharge: Family Issues  Expected Discharge Plan and Services Expected Discharge Plan: Home w Home Health Services In-house Referral: Clinical Social Work, Nutrition, Artist Discharge Planning Services: CM Consult Post Acute Care Choice: Home Health Living arrangements for the past 2 months: Single Family Home Expected Discharge Date: 10/09/21               DME Arranged: Janina Mayo supplies, Tube feeding, Tube feeding pump, Oxygen, Hospital bed, Suction DME Agency: [employer] Date DME Agency Contacted: 10/05/21 Time DME Agency Contacted: [phone]               Social Determinants of Health (SDOH) Interventions    Readmission Risk Interventions    04/14/2021   11:14 AM  Readmission Risk Prevention Plan  Transportation Screening Complete  HRI or Home Care Consult Complete  Social Work Consult for Recovery Care Planning/Counseling Complete  Palliative Care Screening Complete  Medication Review Ocean[employer]) Complete

## 2021-10-13 NOTE — Progress Notes (Signed)
Brief Nutrition Note  Pt stable on current regimen and is pending dc home with wife. Nutrition will sign off. Please submit a new consult if new nutrition needs arise.  Greig Castilla, RD, LDN Clinical Dietitian RD pager # available in AMION  After hours/weekend pager # available in [facility]

## 2021-10-14 DIAGNOSIS — G931 Anoxic brain damage, not elsewhere classified: Secondary | ICD-10-CM | POA: Diagnosis not present

## 2021-10-14 NOTE — Progress Notes (Signed)
  Progress Note   Patient: Billy Cherry [id number] DOB: Jul 13, 1970 DOA: 10/04/2021     10 DOS: the patient was seen and examined on 10/14/2021   Brief hospital course: Jian Hodgman is a 51 y.o. 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  Assessment and Plan: * Anoxic brain injury ([name]) Tracheostomy and PEG tube dependent Chronic respiratory failure with hypoxia ([name]) Poor neurological recovery after cardiac arrest with persistent vegetative state.  Discharge to home 10/04/2021 however readmitted same day due to no home health services. - TOC consult - trach depended, on 5L O@ via collar - Continue trachand PEG tube care - 10/14/21: continue current regimen; working on placement options  Pulmonary embolus ([name]) DVT (deep venous thrombosis) ([name]) - 10/14/21: Continue Eliquis.  Dysphagia PEG dependent.  Continue meds and tube feeding through PEG.  HTN (hypertension) Continue Coreg, Cardura, hydralazine, Isordil, spironolactone.  Chronic systolic CHF (congestive heart failure) ([name]) Most recent EF 30-35% by TTE 09/17/2021. - Continue Coreg, hydralazine, Isordil, spironolactone - Strict I/O's and daily weights  Myoclonus - Noted to have myoclonic seizures.   - 10/14/21: Continue antiepileptic therapy with Klonopin, Keppra, valproate.     Subjective: No acute events ON.   Physical Exam: Vitals:   10/14/21 0743 10/14/21 0914 10/14/21 1113 10/14/21 1147  BP: (!) 150/113  (!) 136/93   Pulse: 87 93 88 83  Resp: 20 18 20 18   Temp: 98.6 F (37 C)  98.5 F (36.9 C)   TempSrc: Oral  Oral   SpO2: 97% 94% 96% 96%  Weight:      Height:       General: 51 y.o. male resting in bed in  NAD Neck: Supple, trach collar in place Cardiovascular: RRR, +S1, S2, no m/g/r, equal pulses throughout Respiratory: CTABL, no w/r/r, normal WOB GI: BS+, NDNT, no masses noted, no organomegaly noted MSK: No c/c, BUE mild edema, offloading boots noted Neuro: unable to participate in exam d/t anoxic brain injury  Data Reviewed:  No new labs for today.  Family Communication: None at bedside  Disposition: Status is: Inpatient Remains inpatient appropriate because: need placement  Planned Discharge Destination: Home with Home Health    Time spent: 25 minutes  Author: 44, DO 10/14/2021 12:12 PM  For on call review [email or web address].

## 2021-10-15 DIAGNOSIS — G931 Anoxic brain damage, not elsewhere classified: Secondary | ICD-10-CM | POA: Diagnosis not present

## 2021-10-15 LAB — CBC WITH DIFFERENTIAL/PLATELET
Abs Immature Granulocytes: 0.03 10*3/uL (ref 0.00–0.07)
Basophils Absolute: 0 10*3/uL (ref 0.0–0.1)
Basophils Relative: 0 %
Eosinophils Absolute: 0.1 10*3/uL (ref 0.0–0.5)
Eosinophils Relative: 1 %
HCT: 37.3 % — ABNORMAL LOW (ref 39.0–52.0)
Hemoglobin: 12.6 g/dL — ABNORMAL LOW (ref 13.0–17.0)
Immature Granulocytes: 0 %
Lymphocytes Relative: 23 %
Lymphs Abs: 1.8 10*3/uL (ref 0.7–4.0)
MCH: 31.4 pg (ref 26.0–34.0)
MCHC: 33.8 g/dL (ref 30.0–36.0)
MCV: 93 fL (ref 80.0–100.0)
Monocytes Absolute: 1.3 10*3/uL — ABNORMAL HIGH (ref 0.1–1.0)
Monocytes Relative: 17 %
Neutro Abs: 4.3 10*3/uL (ref 1.7–7.7)
Neutrophils Relative %: 59 %
Platelets: 211 10*3/uL (ref 150–400)
RBC: 4.01 MIL/uL — ABNORMAL LOW (ref 4.22–5.81)
RDW: 15.3 % (ref 11.5–15.5)
WBC: 7.5 10*3/uL (ref 4.0–10.5)
nRBC: 0 % (ref 0.0–0.2)

## 2021-10-15 LAB — COMPREHENSIVE METABOLIC PANEL
ALT: 16 U/L (ref 0–44)
AST: 18 U/L (ref 15–41)
Albumin: 2.4 g/dL — ABNORMAL LOW (ref 3.5–5.0)
Alkaline Phosphatase: 53 U/L (ref 38–126)
Anion gap: 7 (ref 5–15)
BUN: 30 mg/dL — ABNORMAL HIGH (ref 6–20)
CO2: 29 mmol/L (ref 22–32)
Calcium: 9 mg/dL (ref 8.9–10.3)
Chloride: 97 mmol/L — ABNORMAL LOW (ref 98–111)
Creatinine, Ser: 0.71 mg/dL (ref 0.61–1.24)
GFR, Estimated: >60 mL/min (ref >60)
Glucose, Bld: 114 mg/dL — ABNORMAL HIGH (ref 70–99)
Potassium: 4.7 mmol/L (ref 3.5–5.1)
Sodium: 133 mmol/L — ABNORMAL LOW (ref 135–145)
Total Bilirubin: 0.5 mg/dL (ref 0.3–1.2)
Total Protein: 6.3 g/dL — ABNORMAL LOW (ref 6.5–8.1)

## 2021-10-15 NOTE — Progress Notes (Signed)
  Progress Note   Patient: Billy Cherry [id number] DOB: Jul 28, 1970 DOA: 10/04/2021     11 DOS: the patient was seen and examined on 10/15/2021   Brief hospital course: Billy Cherry is a 51 y.o. 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  8/13 no overnight issues  Assessment and Plan: * Anoxic brain injury ([name]) Tracheostomy and PEG tube dependent Chronic respiratory failure with hypoxia ([name]) Poor neurological recovery after cardiac arrest with persistent vegetative state.  Discharge to home 10/04/2021 however readmitted same day due to no home health services. - TOC consult - trach depended, on 5L O@ via collar - Continue trachand PEG tube care -8/13 continue current regimen, working on placement options    Pulmonary embolus ([name]) DVT (deep venous thrombosis) ([name]) Continue Eliquis  Dysphagia PEG dependent  Continue meds and tube feeding through PEG     HTN (hypertension) Continue Coreg, Cardura, hydralazine, Isordil, spironolactone     Chronic systolic CHF (congestive heart failure) ([name]) Most recent EF 30-35% by TTE 09/17/2021. - Continue Coreg, hydralazine, Isordil, spironolactone - Strict I/O's and daily weights  Myoclonus - Noted to have myoclonic seizures.   Continue antiepileptic therapy with Klonopin, Keppra, valproate.   Subjective: No acute events ON.   Physical Exam: Vitals:   10/15/21 0358 10/15/21 0500 10/15/21 0751 10/15/21 0834  BP:   (!) 150/106   Pulse: 87  87 83  Resp: 16  20 18   Temp:   97.8 F (36.6 C)   TempSrc:   Oral   SpO2: 97%  99% 97%  Weight:  98.7 kg    Height:       Calm, NAD, eyes closed, nonverbal  Trach collar in place Cta  no w/r Reg s1/s2 no gallop Soft benign +bs No edema Unable to assess Mood and affect appropriate in current setting   Data Reviewed: Labs reviewed Na 133, cl 97, bun 30 LFT nml  Family Communication: None at bedside  Disposition: Status is: Inpatient, unsafe discharge Remains inpatient appropriate because: need placement    Time spent:43minutes  Author: 21m, MD 10/15/2021 10:42 AM  For on call review [email or web address].

## 2021-10-16 DIAGNOSIS — G931 Anoxic brain damage, not elsewhere classified: Secondary | ICD-10-CM | POA: Diagnosis not present

## 2021-10-16 LAB — GLUCOSE, CAPILLARY
Glucose-Capillary: 88 mg/dL (ref 70–99)
Glucose-Capillary: 161 mg/dL — ABNORMAL HIGH (ref 70–99)

## 2021-10-16 NOTE — Progress Notes (Signed)
10/15/21 6:00pm   Patient's wife arrived to bedside. Wife states she needs education from Respiratory regarding trach care and that she will not be available any other time this week. Respiratory called and notified.  Respiratory arrived to room and provided education to family present.   Nurse at bedside then provided education regarding PEG tube medication administration education. Family informed on how to use [facility] valve that is attached to PEG tube.

## 2021-10-16 NOTE — Progress Notes (Signed)
Vitals filed when patient being suctioned. HR and RR rechecked after patient recovered from suctioning, MEWS score returned to green.     10/16/21 1630  Assess: MEWS Score  Pulse Rate 87  Resp 20  SpO2 96 %  O2 Device Tracheostomy Collar  O2 Flow Rate (L/min) 5 L/min  FiO2 (%) 21 %  Assess: MEWS Score  MEWS Temp 0  MEWS Systolic 0  MEWS Pulse 0  MEWS RR 0  MEWS LOC 0  MEWS Score 0  MEWS Score Color Green  Assess: if the MEWS score is Yellow or Red  Were vital signs taken at a resting state? No (pt beign suctioned by respiratory)  Focused Assessment No change from prior assessment  Does the patient meet 2 or more of the SIRS criteria? No  Does the patient have a confirmed or suspected source of infection? No  Provider and Rapid Response Notified? No  MEWS guidelines implemented *See Row Information* No, vital signs rechecked  Treat  MEWS Interventions Other (Comment) (vitals rechecked when patient not being suctioned.)  Assess: SIRS CRITERIA  SIRS Temperature  0  SIRS Pulse 0  SIRS Respirations  0  SIRS WBC 1  SIRS Score Sum  1

## 2021-10-16 NOTE — Progress Notes (Signed)
CSW received Private Duty Nursing Referral form from [place] at [employer]. CSW had form signed by Dr. Jomarie Longs and returned it to [place] for submission to [employer] authorization.  Edwin Dada, MSW, LCSW Transitions of Care  Clinical Social Worker II [phone]

## 2021-10-16 NOTE — Progress Notes (Signed)
Wife requesting trach education on what to do if trach accidentally comes out while in her care. Myself and another RRT educating her on what an obturator was and how it was used. Information regarding the CO2 detector was given. Generally trach education reviewed, wife stated she felt much better after conversation. RRT instructed that she would give our trach educator in respiratory a request to give her a call this week. Additional spare trach ordered and left at bedside for preporation of discharge.

## 2021-10-16 NOTE — Progress Notes (Signed)
  Progress Note  Patient: Billy Cherry DOB: 1970-11-19  DOA: 10/04/2021  DOS: 10/16/2021    Brief hospital course: Billy Cherry is a 51 y.o. 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  He remains medically stable. there have been issues with disposition, TOC following  Subjective: Remains stable, no events overnight  Assessment and Plan:  Anoxic brain injury ([name]) Tracheostomy and PEG tube dependent Poor neurological recovery after cardiac arrest with persistent vegetative state.  Discharge to home 10/04/2021 however readmitted same day due to no home health services.  Seen by palliative care numerous times during recent hospitalization, spouse wants to continue full scope of Rx -TOC consult -Continue trach and PEG tube care  History of cardiac arrest In January 2023.  Thought secondary to primary V-fib arrest with question of ACE inhibitor induced angioedema.  Chronic respiratory failure with hypoxia ([name]) Trach dependent, saturating well on 5 L O2 via trach collar.  Pulmonary embolus ([name]) Continue Eliquis.  DVT (deep venous thrombosis) ([name]) Lower extremity venous ultrasound 05/15/2021 showed age-indeterminate DVT involving left femoral, left proximal profunda, left posterior tibial, left popliteal, and left peroneal veins. -Continue Eliquis  Dysphagia PEG dependent.  Continue meds and tube feeding through PEG.  HTN (hypertension) Continue Coreg, Cardura, hydralazine, Isordil, spironolactone.  Chronic systolic CHF (congestive heart failure) ([name]) Most recent EF 30-35% by TTE 09/17/2021. -Continue Coreg, hydralazine, Isordil, spironolactone -daily  weights  Myoclonus: None currently noted.  - Continue antiepileptic therapy with Klonopin, Keppra, valproate.  Obesity: Estimated body mass index is 31.22 kg/m as calculated from the following:   Height as of this encounter: 5\' 10"  (1.778 m).   Weight as of this encounter: 98.7 kg.   Objective: Vitals:   10/16/21 0355 10/16/21 0500 10/16/21 0757 10/16/21 0900  BP:   (!) 139/96   Pulse: 91  80 91  Resp: 20  18 18   Temp:   98 F (36.7 C)   TempSrc:   Oral   SpO2: 97%  100% 94%  Weight:  98.7 kg    Height:       Chronically ill male laying in bed, opens eyes, does not track or follow commands HEENT: Trach collar CVS: S1-S2, regular rhythm Lungs: Few conducted upper airway sounds Abdomen: Soft, nontender, bowel sounds present, PEG tube noted Extremities: Warm, PRAFO boots on Neuro: Nonverbal, does not track or move extremities.   Data Personally reviewed: CBC: Recent Labs  Lab 10/11/21 0529 10/15/21 0219  WBC 6.5 7.5  NEUTROABS  --  4.3  HGB 12.7* 12.6*  HCT 39.0 37.3*  MCV 94.9 93.0  PLT 211 211   Basic Metabolic Panel: Recent Labs  Lab 10/11/21 0529 10/15/21 0219  NA 135 133*  K 4.6 4.7  CL 97* 97*  CO2 29 29  GLUCOSE 84 114*  BUN 28* 30*  CREATININE 0.67 0.71  CALCIUM 9.3 9.0   12/11/21, MD 10/16/2021 10:54 AM Page by [email or web address]

## 2021-10-17 ENCOUNTER — Other Ambulatory Visit (HOSPITAL_COMMUNITY): Payer: Self-pay

## 2021-10-17 ENCOUNTER — Other Ambulatory Visit: Payer: Self-pay

## 2021-10-17 DIAGNOSIS — L89302 Pressure ulcer of unspecified buttock, stage 2: Secondary | ICD-10-CM | POA: Diagnosis present

## 2021-10-17 DIAGNOSIS — G931 Anoxic brain damage, not elsewhere classified: Secondary | ICD-10-CM | POA: Diagnosis not present

## 2021-10-17 DIAGNOSIS — L89611 Pressure ulcer of right heel, stage 1: Secondary | ICD-10-CM | POA: Diagnosis present

## 2021-10-17 LAB — GLUCOSE, CAPILLARY: Glucose-Capillary: 94 mg/dL (ref 70–99)

## 2021-10-17 NOTE — Progress Notes (Signed)
Progress Note    Billy Cherry   IRC:[id number]  DOB: [birth year]  DOA: 10/04/2021     13 PCP: Patient, No Pcp Per  Initial CC: no [facility]  Hospital Course: Billy Cherry is a 52 y.o. 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  Interval History:  No events overnight.  Patient well-known to me from prior hospitalization.  No change neurologically from the last time I saw him.  He remains in a persistent vegetative state with extremely poor quality of life.  Assessment and Plan:  S/p Cardiac arrest Postarrest anoxic encephalopathy Postarrest respiratory failure s/p tracheostomy S/P PEG tube placement 2D echocardiogram showed reduced LV function at <20% with global hypokinesis.  Now status post trach and PEG.  Remains with anoxic encephalopathy. -Continue Keppra - poor prognosis overall; professional recommendation is still transition to hospice/comfort care   Anoxic encephalopathy ([name]) MRI brain significant for hypoxic brain injury.  Nonpurposeful eye movements and unable to interact in any meaningful capacity.  Poor neurologic status has remained unchanged - Patient not likely to have any significant meaningful recovery - TENTATIVE dispo plan again being pursued for patient to go home with [facility] and care of his wife  Chronic respiratory failure with hypoxia ([name]) Status post trach collar. Now chronic. Continue O2 -Continue treatment for secretions with scopolamine and Robinul - at risk for recurrent infections   DVT (deep venous thrombosis) ([name]) Duplex ultrasound of lower extremity on 05/15/2021 showed age indeterminate deep vein thrombosis involving the left femoral vein, left proximal  profunda vein, left posterior tibial veins, left popliteal vein, and left peroneal veins. -Continue Eliquis   Dysphagia On bolus feeds per dietitian -Aspiration precautions -Continue tube feeds   Pulmonary embolus ([name]) Noted to have possible acute on chronic pulmonary embolism on CT.  Also have some evidence of possible pulmonary hypertension.   -Continue Eliquis indefinitely    Pressure injury of skin - right heel stage 1 and right buttock stage 2 - continue wound care   HTN (hypertension) Blood pressure acceptable, follow  -Continue Coreg, Cardura, isosorbide dinitrate, spironolactone, and hydralazine   HFrEF (heart failure with reduced ejection fraction) ([name]) 2D echocardiogram-LVEF < 20%,  -Continue coreg, imdur, spironolactone   Myoclonus Seen by neurology. Per neuro notes, suppressing his myoclonus will not ultimately change the prognosis. -Continue Keppra, Klonopin   Peripheral edema -will monitor     Old records reviewed in assessment of this patient  Antimicrobials:   DVT prophylaxis:   apixaban (ELIQUIS) tablet 5 mg   Code Status:   Code Status: Full Code  Mobility Assessment (last 72 hours)     Mobility Assessment   No documentation.           Barriers to discharge: Awaiting HH? Disposition Plan:  Home Status is: Inpt  Objective: Blood pressure (!) 124/101, pulse 89, temperature 97.6 F (36.4 C), temperature source Oral, resp. rate (!) 24, height 5\' 10"  (1.778 m), weight 98.7 kg, SpO2 99 %.  Examination:  Physical Exam Constitutional:      Comments: Chronically ill-appearing adult man lying in bed with nonpurposeful movements  HENT:     Head: Normocephalic and atraumatic.     Mouth/Throat:     Mouth: Mucous membranes are dry.  Eyes:     Comments: Unable to open eyelids to examine eyes  Cardiovascular:     Rate and Rhythm: Normal rate and regular rhythm.  Pulmonary:     Effort: Pulmonary effort is normal.  Abdominal:     General:  Bowel sounds are normal. There is no distension.     Palpations: Abdomen is soft.     Tenderness: There is no abdominal tenderness.  Musculoskeletal:        General: Swelling present.     Cervical back: No rigidity.  Skin:    General: Skin is warm and dry.  Neurological:     Comments: Does not follow commands.  Nonverbal.      Consultants:    Procedures:    Data Reviewed: Results for orders placed or performed during the hospital encounter of 10/04/21 (from the past 24 hour(s))  Glucose, capillary     Status: None   Collection Time: 10/17/21 11:23 AM  Result Value Ref Range   Glucose-Capillary 94 70 - 99 mg/dL    I have Reviewed nursing notes, Vitals, and Lab results since pt's last encounter. Pertinent lab results : see above I have ordered test including BMP, CBC, Mg I have reviewed the last note from staff over past 24 hours I have discussed pt's care plan and test results with nursing staff, case manager   LOS: 13 days   Lewie Chamber, MD [facility] 10/17/2021, 3:48 PM

## 2021-10-18 DIAGNOSIS — J9611 Chronic respiratory failure with hypoxia: Secondary | ICD-10-CM | POA: Diagnosis not present

## 2021-10-18 DIAGNOSIS — G931 Anoxic brain damage, not elsewhere classified: Secondary | ICD-10-CM | POA: Diagnosis not present

## 2021-10-18 MED ORDER — POLYVINYL ALCOHOL 1.4 % OP SOLN: 1.0000 [drp] | OPHTHALMIC | Status: DC | PRN

## 2021-10-18 NOTE — Plan of Care (Signed)

## 2021-10-18 NOTE — Progress Notes (Signed)
CSW spoke with Vance Gather at [employer] who states the insurance authorization is still pending at this time but she is optimistic the authorization will be approved today so that services can be initiated tomorrow. Vance Gather will return call to CSW with updates.  Edwin Dada, MSW, LCSW Transitions of Care  Clinical Social Worker II [phone]

## 2021-10-18 NOTE — Progress Notes (Signed)
Progress Note    Karder Goodin   FTD:[id number]  DOB: Mar 20, 1970  DOA: 10/04/2021     14 PCP: Patient, No Pcp Per  Initial CC: no [facility]  Hospital Course: Amarian Botero is a 51 y.o. 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  Interval History:  No events overnight.  No change neurologically. He remains in a persistent vegetative state with extremely poor quality of life. Continuing to await insurance approval for [facility].  Assessment and Plan:  S/p Cardiac arrest Postarrest anoxic encephalopathy Postarrest respiratory failure s/p tracheostomy S/P PEG tube placement 2D echocardiogram showed reduced LV function at <20% with global hypokinesis.  Now status post trach and PEG.  Remains with anoxic encephalopathy. -Continue Keppra - poor prognosis overall; professional recommendation is still transition to hospice/comfort care   Anoxic encephalopathy ([name]) MRI brain significant for hypoxic brain injury.  Nonpurposeful eye movements and unable to interact in any meaningful capacity.  Poor neurologic status has remained unchanged - Patient not likely to have any significant meaningful recovery - TENTATIVE dispo plan again being pursued for patient to go home with [facility] and care of his wife  Chronic respiratory failure with hypoxia ([name]) Status post trach collar. Now chronic. Continue O2 -Continue treatment for secretions with scopolamine and Robinul - at risk for recurrent infections   DVT (deep venous thrombosis) ([name]) Duplex ultrasound of lower extremity on 05/15/2021 showed age indeterminate deep vein thrombosis involving the left femoral vein, left proximal profunda vein, left posterior tibial  veins, left popliteal vein, and left peroneal veins. -Continue Eliquis   Dysphagia On bolus feeds per dietitian -Aspiration precautions -Continue tube feeds   Pulmonary embolus ([name]) Noted to have possible acute on chronic pulmonary embolism on CT.  Also have some evidence of possible pulmonary hypertension.   -Continue Eliquis indefinitely    Pressure injury of skin - right heel stage 1 and right buttock stage 2 - continue wound care   HTN (hypertension) Blood pressure acceptable, follow  -Continue Coreg, Cardura, isosorbide dinitrate, spironolactone, and hydralazine   HFrEF (heart failure with reduced ejection fraction) ([name]) 2D echocardiogram-LVEF < 20%,  -Continue coreg, imdur, spironolactone   Myoclonus Seen by neurology. Per neuro notes, suppressing his myoclonus will not ultimately change the prognosis. -Continue Keppra, Klonopin   Peripheral edema -will monitor     Old records reviewed in assessment of this patient  Antimicrobials:   DVT prophylaxis:   apixaban (ELIQUIS) tablet 5 mg   Code Status:   Code Status: Full Code  Mobility Assessment (last 72 hours)     Mobility Assessment   No documentation.           Barriers to discharge: Awaiting insurance approval for [facility] Disposition Plan:  Home Status is: Inpt  Objective: Blood pressure (!) 137/108, pulse 92, temperature 98.6 F (37 C), temperature source Axillary, resp. rate 18, height 5\' 10"  (1.778 m), weight 94.6 kg, SpO2 96 %.  Examination:  Physical Exam Constitutional:      Comments: Chronically ill-appearing adult man lying in bed with nonpurposeful movements  HENT:     Head: Normocephalic and atraumatic.     Mouth/Throat:     Mouth: Mucous membranes are dry.  Eyes:     Comments: Unable  to open eyelids to examine eyes  Cardiovascular:     Rate and Rhythm: Normal rate and regular rhythm.  Pulmonary:     Effort: Pulmonary effort is normal.  Abdominal:     General: Bowel sounds are  normal. There is no distension.     Palpations: Abdomen is soft.     Tenderness: There is no abdominal tenderness.  Musculoskeletal:        General: Swelling present.     Cervical back: No rigidity.  Skin:    General: Skin is warm and dry.  Neurological:     Comments: Does not follow commands.  Nonverbal.      Consultants:    Procedures:    Data Reviewed: No results found for this or any previous visit (from the past 24 hour(s)).   I have Reviewed nursing notes, Vitals, and Lab results since pt's last encounter. Pertinent lab results : see above I have ordered test including BMP, CBC, Mg I have reviewed the last note from staff over past 24 hours I have discussed pt's care plan and test results with nursing staff, case manager   LOS: 14 days   Lewie Chamber, MD [facility] 10/18/2021, 3:46 PM

## 2021-10-19 ENCOUNTER — Other Ambulatory Visit (HOSPITAL_COMMUNITY): Payer: Self-pay

## 2021-10-19 DIAGNOSIS — G931 Anoxic brain damage, not elsewhere classified: Secondary | ICD-10-CM | POA: Diagnosis not present

## 2021-10-19 MED ORDER — GLYCOPYRROLATE 1 MG PO TABS
1.0000 mg | ORAL_TABLET | Freq: Three times a day (TID) | ORAL | 0 refills | Status: DC
Start: 2021-10-19 — End: 2021-10-26
  Filled 2021-10-19: qty 90, 30d supply, fill #0

## 2021-10-19 MED ORDER — CLONAZEPAM 0.5 MG PO TABS: 0.5000 mg | ORAL_TABLET | Freq: Two times a day (BID) | ORAL | 0 refills | Status: DC

## 2021-10-19 MED ORDER — CLONAZEPAM 0.5 MG PO TABS
0.5000 mg | ORAL_TABLET | Freq: Two times a day (BID) | ORAL | 0 refills | Status: DC
  Filled 2021-10-19: qty 60, 30d supply, fill #0

## 2021-10-19 MED ORDER — ISOSORBIDE DINITRATE 30 MG PO TABS
30.0000 mg | ORAL_TABLET | Freq: Three times a day (TID) | ORAL | 0 refills | Status: DC
  Filled 2021-10-19: qty 90, 30d supply, fill #0

## 2021-10-19 MED ORDER — SPIRONOLACTONE 25 MG PO TABS
25.0000 mg | ORAL_TABLET | Freq: Every day | ORAL | 0 refills | Status: DC
  Filled 2021-10-19: qty 30, 30d supply, fill #0

## 2021-10-19 MED ORDER — FAMOTIDINE 20 MG PO TABS
20.0000 mg | ORAL_TABLET | Freq: Two times a day (BID) | ORAL | 0 refills | Status: DC
  Filled 2021-10-19: qty 60, 30d supply, fill #0

## 2021-10-19 MED ORDER — NUTRISOURCE FIBER PO PACK
1.0000 | PACK | Freq: Two times a day (BID) | ORAL | 0 refills | Status: DC
  Filled 2021-10-19: qty 60, 30d supply, fill #0

## 2021-10-19 MED ORDER — HYDRALAZINE HCL 50 MG PO TABS
50.0000 mg | ORAL_TABLET | Freq: Three times a day (TID) | ORAL | 0 refills | Status: DC
  Filled 2021-10-19: qty 90, 30d supply, fill #0

## 2021-10-19 NOTE — Progress Notes (Addendum)
CSW spoke with Billy Cherry of [facility] who states the agency still does not have insurance authorization for services. CSW cancelled scheduled transportation.   Edwin Dada, MSW, LCSW Transitions of Care  Clinical Social Worker II [phone]

## 2021-10-19 NOTE — Progress Notes (Signed)
Patient seen today by trach team for consult.  No education is needed at this time.  All necessary equipment is at beside.   Will continue to follow for progression.  

## 2021-10-19 NOTE — Plan of Care (Signed)

## 2021-10-19 NOTE — Progress Notes (Signed)
Progress Note    Billy Cherry   ZOX:[id number]  DOB: [birth year]  DOA: 10/04/2021     15 PCP: Patient, No Pcp Per  Initial CC: no [name]  Hospital Course: Billy Cherry is a 51 y.o. 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  Interval History:  No events overnight.  No change neurologically. He remains in a persistent vegetative state with extremely poor quality of life. Continuing to await insurance approval for [name].  Assessment and Plan:  S/p Cardiac arrest Postarrest anoxic encephalopathy Postarrest respiratory failure s/p tracheostomy S/P PEG tube placement 2D echocardiogram showed reduced LV function at <20% with global hypokinesis.  Now status post trach and PEG.  Remains with anoxic encephalopathy. -Continue Keppra - poor prognosis overall; professional recommendation is still transition to hospice/comfort care   Anoxic encephalopathy ([name]) MRI brain significant for hypoxic brain injury.  Nonpurposeful eye movements and unable to interact in any meaningful capacity.  Poor neurologic status has remained unchanged - Patient not likely to have any significant meaningful recovery - TENTATIVE dispo plan again being pursued for patient to go home with [name] and care of his wife  Chronic respiratory failure with hypoxia ([name]) Status post trach collar. Now chronic. Continue O2 -Continue treatment for secretions with scopolamine and Robinul - at risk for recurrent infections   DVT (deep venous thrombosis) ([name]) Duplex ultrasound of lower extremity on 05/15/2021 showed age indeterminate deep vein thrombosis involving the left femoral vein, left proximal profunda vein, left posterior tibial  veins, left popliteal vein, and left peroneal veins. -Continue Eliquis   Dysphagia On bolus feeds per dietitian -Aspiration precautions -Continue tube feeds   Pulmonary embolus ([name]) Noted to have possible acute on chronic pulmonary embolism on CT.  Also have some evidence of possible pulmonary hypertension.   -Continue Eliquis indefinitely    Pressure injury of skin - right heel stage 1 and right buttock stage 2 - continue wound care   HTN (hypertension) Blood pressure acceptable, follow  -Continue Coreg, Cardura, isosorbide dinitrate, spironolactone, and hydralazine   HFrEF (heart failure with reduced ejection fraction) ([name]) 2D echocardiogram-LVEF < 20%,  -Continue coreg, imdur, spironolactone   Myoclonus Seen by neurology. Per neuro notes, suppressing his myoclonus will not ultimately change the prognosis. -Continue Keppra, Klonopin   Peripheral edema -will monitor     Old records reviewed in assessment of this patient  Antimicrobials:   DVT prophylaxis:   apixaban (ELIQUIS) tablet 5 mg   Code Status:   Code Status: Full Code  Mobility Assessment (last 72 hours)     Mobility Assessment     Row Name 10/18/21 2130           Does patient have an order for bedrest or is patient medically unstable Yes- Bedfast (Level 1) - Complete                Barriers to discharge: Awaiting insurance approval for [name] Disposition Plan:  Home Status is: Inpt  Objective: Blood pressure 114/77, pulse 98, temperature 98.6 F (37 C), temperature source Oral, resp. rate 14, height 5\' 10"  (1.778 m), weight 94.6 kg, SpO2 94 %.  Examination:  Physical Exam Constitutional:      Comments: Chronically ill-appearing adult man lying in  bed with nonpurposeful movements  HENT:     Head: Normocephalic and atraumatic.     Mouth/Throat:     Mouth: Mucous membranes are dry.  Eyes:     Comments: Unable to open eyelids to examine eyes  Cardiovascular:     Rate and Rhythm:  Normal rate and regular rhythm.  Pulmonary:     Effort: Pulmonary effort is normal.  Abdominal:     General: Bowel sounds are normal. There is no distension.     Palpations: Abdomen is soft.     Tenderness: There is no abdominal tenderness.  Musculoskeletal:        General: Swelling present.     Cervical back: No rigidity.  Skin:    General: Skin is warm and dry.  Neurological:     Comments: Does not follow commands.  Nonverbal.      Consultants:    Procedures:    Data Reviewed: No results found for this or any previous visit (from the past 24 hour(s)).   I have Reviewed nursing notes, Vitals, and Lab results since pt's last encounter. Pertinent lab results : see above I have reviewed the last note from staff over past 24 hours I have discussed pt's care plan and test results with nursing staff, case manager   LOS: 15 days   Lewie Chamber, MD [facility] 10/19/2021, 3:33 PM

## 2021-10-20 DIAGNOSIS — G931 Anoxic brain damage, not elsewhere classified: Secondary | ICD-10-CM | POA: Diagnosis not present

## 2021-10-20 NOTE — Plan of Care (Signed)

## 2021-10-20 NOTE — Progress Notes (Signed)
Progress Note    Billy Cherry   TIR:[id number]  DOB: [birth year]  DOA: 10/04/2021     16 PCP: Patient, No Pcp Per  Initial CC: no [facility]  Hospital Course: Billy Cherry is a 51 y.o. 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  Interval History:  No events overnight.  No change neurologically. He remains in a persistent vegetative state with extremely poor quality of life. Continuing to await insurance approval for [facility].  Assessment and Plan:  S/p Cardiac arrest Postarrest anoxic encephalopathy Postarrest respiratory failure s/p tracheostomy S/P PEG tube placement 2D echocardiogram showed reduced LV function at <20% with global hypokinesis.  Now status post trach and PEG.  Remains with anoxic encephalopathy. -Continue Keppra - poor prognosis overall; professional recommendation is still transition to hospice/comfort care   Anoxic encephalopathy ([name]) MRI brain significant for hypoxic brain injury.  Nonpurposeful eye movements and unable to interact in any meaningful capacity.  Poor neurologic status has remained unchanged - Patient not likely to have any significant meaningful recovery - TENTATIVE dispo plan again being pursued for patient to go home with [facility] and care of his wife  Chronic respiratory failure with hypoxia ([name]) Status post trach collar. Now chronic. Continue O2. Can try weaning as able  -Continue treatment for secretions with scopolamine and Robinul - at risk for recurrent infections   DVT (deep venous thrombosis) ([name]) Duplex ultrasound of lower extremity on 05/15/2021 showed age indeterminate deep vein thrombosis involving the left femoral vein, left proximal profunda  vein, left posterior tibial veins, left popliteal vein, and left peroneal veins. -Continue Eliquis   Dysphagia On bolus feeds per dietitian -Aspiration precautions -Continue tube feeds   Pulmonary embolus ([name]) Noted to have possible acute on chronic pulmonary embolism on CT.  Also have some evidence of possible pulmonary hypertension.   -Continue Eliquis indefinitely    Pressure injury of skin - right heel stage 1 and right buttock stage 2 - continue wound care   HTN (hypertension) Blood pressure acceptable, follow  -Continue Coreg, Cardura, isosorbide dinitrate, spironolactone, and hydralazine   HFrEF (heart failure with reduced ejection fraction) ([name]) 2D echocardiogram-LVEF < 20%,  -Continue coreg, imdur, spironolactone   Myoclonus Seen by neurology. Per neuro notes, suppressing his myoclonus will not ultimately change the prognosis. -Continue Keppra, Klonopin   Peripheral edema -will monitor     Old records reviewed in assessment of this patient  Antimicrobials:   DVT prophylaxis:   apixaban (ELIQUIS) tablet 5 mg   Code Status:   Code Status: Full Code  Mobility Assessment (last 72 hours)     Mobility Assessment     Row Name 10/19/21 2235 10/18/21 2130         Does patient have an order for bedrest or is patient medically unstable Yes- Bedfast (Level 1) - Complete Yes- Bedfast (Level 1) - Complete               Barriers to discharge: Awaiting insurance approval for [facility] Disposition Plan:  Home Status is: Inpt  Objective: Blood pressure (!) 132/98, pulse 95, temperature 98.3 F (36.8 C), temperature source Axillary, resp. rate 16, height 5\' 10"  (1.778 m), weight 95.7 kg, SpO2 94 %.  Examination:  Physical Exam Constitutional:  Comments: Chronically ill-appearing adult man lying in bed with nonpurposeful movements  HENT:     Head: Normocephalic and atraumatic.     Mouth/Throat:     Mouth: Mucous membranes are dry.  Eyes:     Comments:  Unable to open eyelids to examine eyes  Cardiovascular:     Rate and Rhythm: Normal rate and regular rhythm.  Pulmonary:     Effort: Pulmonary effort is normal.  Abdominal:     General: Bowel sounds are normal. There is no distension.     Palpations: Abdomen is soft.     Tenderness: There is no abdominal tenderness.  Musculoskeletal:        General: Swelling present.     Cervical back: No rigidity.  Skin:    General: Skin is warm and dry.  Neurological:     Comments: Does not follow commands.  Nonverbal.      Consultants:    Procedures:    Data Reviewed: No results found for this or any previous visit (from the past 24 hour(s)).   I have Reviewed nursing notes, Vitals, and Lab results since pt's last encounter. Pertinent lab results : see above I have reviewed the last note from staff over past 24 hours I have discussed pt's care plan and test results with nursing staff, case manager   LOS: 16 days   Lewie Chamber, MD [facility] 10/20/2021, 2:48 PM

## 2021-10-20 NOTE — Progress Notes (Signed)
CSW spoke with Vance Gather at [facility] who states the agency does not have insurance authorization at this time due to the previous agency having to relinquish the original authorization. Vance Gather will provide CSW with updates as they become available.  CSW informed Steward [facility] supervisor of information.  Edwin Dada, MSW, LCSW Transitions of Care  Clinical Social Worker II [phone]

## 2021-10-21 DIAGNOSIS — G931 Anoxic brain damage, not elsewhere classified: Secondary | ICD-10-CM | POA: Diagnosis not present

## 2021-10-21 NOTE — Plan of Care (Signed)

## 2021-10-21 NOTE — Plan of Care (Signed)
  Problem: Education: Goal: Knowledge of General Education information will improve Description: Including pain rating scale, medication(s)/side effects and non-pharmacologic comfort measures 10/21/2021 1634 by Mamie Nick I, RN Outcome: Progressing 10/21/2021 1634 by Mamie Nick I, RN Outcome: Not Progressing   Problem: Health Behavior/Discharge Planning: Goal: Ability to manage health-related needs will improve 10/21/2021 1634 by Mamie Nick I, RN Outcome: Progressing 10/21/2021 1634 by Mamie Nick I, RN Outcome: Not Progressing   Problem: Clinical Measurements: Goal: Ability to maintain clinical measurements within normal limits will improve 10/21/2021 1634 by Mamie Nick I, RN Outcome: Progressing 10/21/2021 1634 by Mamie Nick I, RN Outcome: Not Progressing Goal: Will remain free from infection 10/21/2021 1634 by Mamie Nick I, RN Outcome: Progressing 10/21/2021 1634 by Mamie Nick I, RN Outcome: Not Progressing Goal: Diagnostic test results will improve 10/21/2021 1634 by Mamie Nick I, RN Outcome: Progressing 10/21/2021 1634 by Mamie Nick I, RN Outcome: Not Progressing Goal: Respiratory complications will improve 10/21/2021 1634 by Mamie Nick I, RN Outcome: Progressing 10/21/2021 1634 by Mamie Nick I, RN Outcome: Not Progressing Goal: Cardiovascular complication will be avoided 10/21/2021 1634 by Mamie Nick I, RN Outcome: Progressing 10/21/2021 1634 by Mamie Nick I, RN Outcome: Not Progressing   Problem: Activity: Goal: Risk for activity intolerance will decrease 10/21/2021 1634 by Mamie Nick I, RN Outcome: Progressing 10/21/2021 1634 by Mamie Nick I, RN Outcome: Not Progressing   Problem: Nutrition: Goal: Adequate nutrition will be maintained 10/21/2021 1634 by Mamie Nick I, RN Outcome: Progressing 10/21/2021 1634 by Mamie Nick I, RN Outcome: Not  Progressing   Problem: Coping: Goal: Level of anxiety will decrease 10/21/2021 1634 by Mamie Nick I, RN Outcome: Progressing 10/21/2021 1634 by Mamie Nick I, RN Outcome: Not Progressing   Problem: Elimination: Goal: Will not experience complications related to bowel motility 10/21/2021 1634 by Mamie Nick I, RN Outcome: Progressing 10/21/2021 1634 by Mamie Nick I, RN Outcome: Not Progressing Goal: Will not experience complications related to urinary retention 10/21/2021 1634 by Mamie Nick I, RN Outcome: Progressing 10/21/2021 1634 by Mamie Nick I, RN Outcome: Not Progressing   Problem: Pain Managment: Goal: General experience of comfort will improve 10/21/2021 1634 by Mamie Nick I, RN Outcome: Progressing 10/21/2021 1634 by Mamie Nick I, RN Outcome: Not Progressing   Problem: Safety: Goal: Ability to remain free from injury will improve 10/21/2021 1634 by Mamie Nick I, RN Outcome: Progressing 10/21/2021 1634 by Mamie Nick I, RN Outcome: Not Progressing   Problem: Skin Integrity: Goal: Risk for impaired skin integrity will decrease 10/21/2021 1634 by Mamie Nick I, RN Outcome: Progressing 10/21/2021 1634 by Mamie Nick I, RN Outcome: Not Progressing

## 2021-10-21 NOTE — Progress Notes (Signed)
Progress Note    Billy Cherry   [id number]  DOB: [birth year]  DOA: 10/04/2021     17 PCP: Patient, No Pcp Per  Initial CC: no [facility]  Hospital Course: Billy Cherry is a 51 y.o. 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  Interval History:  No events overnight.  No change neurologically. He remains in a persistent vegetative state with extremely poor quality of life. Continuing to await insurance approval for [name].  Assessment and Plan:  S/p Cardiac arrest Postarrest anoxic encephalopathy Postarrest respiratory failure s/p tracheostomy S/P PEG tube placement 2D echocardiogram showed reduced LV function at <20% with global hypokinesis.  Now status post trach and PEG.  Remains with anoxic encephalopathy. -Continue Keppra - poor prognosis overall; professional recommendation is still transition to hospice/comfort care   Anoxic encephalopathy ([name]) MRI brain significant for hypoxic brain injury.  Nonpurposeful eye movements and unable to interact in any meaningful capacity.  Poor neurologic status has remained unchanged - Patient not likely to have any significant meaningful recovery - TENTATIVE dispo plan again being pursued for patient to go home with [facility] and care of his wife  Chronic respiratory failure with hypoxia ([name]) Status post trach collar. Now chronic. Continue O2. Can try weaning as able  -Continue treatment for secretions with scopolamine and Robinul - at risk for recurrent infections   DVT (deep venous thrombosis) ([name]) Duplex ultrasound of lower extremity on 05/15/2021 showed age indeterminate deep vein thrombosis involving the left femoral vein, left proximal profunda  vein, left posterior tibial veins, left popliteal vein, and left peroneal veins. -Continue Eliquis   Dysphagia On bolus feeds per dietitian -Aspiration precautions -Continue tube feeds   Pulmonary embolus ([name]) Noted to have possible acute on chronic pulmonary embolism on CT.  Also have some evidence of possible pulmonary hypertension.   -Continue Eliquis indefinitely    Pressure injury of skin - right heel stage 1 and right buttock stage 2 - continue wound care   HTN (hypertension) Blood pressure acceptable, follow  -Continue Coreg, Cardura, isosorbide dinitrate, spironolactone, and hydralazine   HFrEF (heart failure with reduced ejection fraction) ([name]) 2D echocardiogram-LVEF < 20%,  -Continue coreg, imdur, spironolactone   Myoclonus Seen by neurology. Per neuro notes, suppressing his myoclonus will not ultimately change the prognosis. -Continue Keppra, Klonopin   Peripheral edema -will monitor     Old records reviewed in assessment of this patient  Antimicrobials:   DVT prophylaxis:   apixaban (ELIQUIS) tablet 5 mg   Code Status:   Code Status: Full Code  Mobility Assessment (last 72 hours)     Mobility Assessment     Row Name 10/19/21 2235 10/18/21 2130         Does patient have an order for bedrest or is patient medically unstable Yes- Bedfast (Level 1) - Complete Yes- Bedfast (Level 1) - Complete               Barriers to discharge: Awaiting insurance approval for [facility] Disposition Plan:  Home Status is: Inpt  Objective: Blood pressure (!) 113/93, pulse 99, temperature 99.5 F (37.5 C), temperature source Axillary, resp. rate 20, height 5\' 10"  (1.778 m), weight 95.2 kg, SpO2 92 %.  Examination:  Physical Exam Constitutional:  Comments: Chronically ill-appearing adult man lying in bed with nonpurposeful movements  HENT:     Head: Normocephalic and atraumatic.     Mouth/Throat:     Mouth: Mucous membranes are dry.  Eyes:     Comments:  Unable to open eyelids to examine eyes  Cardiovascular:     Rate and Rhythm: Normal rate and regular rhythm.  Pulmonary:     Effort: Pulmonary effort is normal.  Abdominal:     General: Bowel sounds are normal. There is no distension.     Palpations: Abdomen is soft.     Tenderness: There is no abdominal tenderness.  Musculoskeletal:        General: Swelling present.     Cervical back: No rigidity.  Skin:    General: Skin is warm and dry.  Neurological:     Comments: Does not follow commands.  Nonverbal.      Consultants:    Procedures:    Data Reviewed: No results found for this or any previous visit (from the past 24 hour(s)).   I have Reviewed nursing notes, Vitals, and Lab results since pt's last encounter. Pertinent lab results : see above I have reviewed the last note from staff over past 24 hours I have discussed pt's care plan and test results with nursing staff, case manager   LOS: 17 days   Billy Chamber, MD [facility] 10/21/2021, 2:17 PM

## 2021-10-22 DIAGNOSIS — G931 Anoxic brain damage, not elsewhere classified: Secondary | ICD-10-CM | POA: Diagnosis not present

## 2021-10-22 NOTE — Progress Notes (Signed)
Progress Note    Billy Cherry   [id number]  DOB: 1970-04-10  DOA: 10/04/2021     18 PCP: Patient, No Pcp Per  Initial CC: no [facility]  Hospital Course: Billy Cherry is a 51 y.o. 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  Interval History:  No events overnight.  No change neurologically. He remains in a persistent vegetative state with extremely poor quality of life. Continuing to await insurance approval for [facility].  Assessment and Plan:  S/p Cardiac arrest Postarrest anoxic encephalopathy Postarrest respiratory failure s/p tracheostomy S/P PEG tube placement 2D echocardiogram showed reduced LV function at <20% with global hypokinesis.  Now status post trach and PEG.  Remains with anoxic encephalopathy. -Continue Keppra - poor prognosis overall; professional recommendation is still transition to hospice/comfort care   Anoxic encephalopathy ([name]) MRI brain significant for hypoxic brain injury.  Nonpurposeful eye movements and unable to interact in any meaningful capacity.  Poor neurologic status has remained unchanged - Patient not likely to have any significant meaningful recovery - TENTATIVE dispo plan again being pursued for patient to go home with [facility] and care of his wife  Chronic respiratory failure with hypoxia ([name]) Status post trach collar. Now chronic. Continue O2. Can try weaning as able  -Continue treatment for secretions with scopolamine and Robinul - at risk for recurrent infections   DVT (deep venous thrombosis) ([name]) Duplex ultrasound of lower extremity on 05/15/2021 showed age indeterminate deep vein thrombosis involving the left femoral vein, left proximal profunda  vein, left posterior tibial veins, left popliteal vein, and left peroneal veins. -Continue Eliquis   Dysphagia On bolus feeds per dietitian -Aspiration precautions -Continue tube feeds   Pulmonary embolus ([name]) Noted to have possible acute on chronic pulmonary embolism on CT.  Also have some evidence of possible pulmonary hypertension.   -Continue Eliquis indefinitely    Pressure injury of skin - right heel stage 1 and right buttock stage 2 - continue wound care   HTN (hypertension) Blood pressure acceptable, follow  -Continue Coreg, Cardura, isosorbide dinitrate, spironolactone, and hydralazine   HFrEF (heart failure with reduced ejection fraction) ([name]) 2D echocardiogram-LVEF < 20%,  -Continue coreg, imdur, spironolactone   Myoclonus Seen by neurology. Per neuro notes, suppressing his myoclonus will not ultimately change the prognosis. -Continue Keppra, Klonopin   Peripheral edema -will monitor     Old records reviewed in assessment of this patient  Antimicrobials:   DVT prophylaxis:   apixaban (ELIQUIS) tablet 5 mg   Code Status:   Code Status: Full Code  Mobility Assessment (last 72 hours)     Mobility Assessment     Row Name 10/21/21 1922 10/21/21 0800 10/19/21 2235       Does patient have an order for bedrest or is patient medically unstable Yes- Bedfast (Level 1) - Complete Yes- Bedfast (Level 1) - Complete Yes- Bedfast (Level 1) - Complete     What is the highest level of mobility based on the progressive mobility assessment? Level 1 (Bedfast) - Unable to balance while sitting on edge of bed Level 1 (Bedfast) - Unable to balance while sitting on edge of bed --     Is the above level different from baseline mobility prior to current  illness? Yes - Recommend PT order Yes - Recommend PT order --              Barriers to discharge: Awaiting insurance approval for [facility] Disposition Plan:  Home Status is: Inpt  Objective: Blood pressure (!) 147/104,  pulse 91, temperature 99.3 F (37.4 C), temperature source Oral, resp. rate 18, height 5\' 10"  (1.778 m), weight 94.6 kg, SpO2 96 %.  Examination:  Physical Exam Constitutional:      Comments: Chronically ill-appearing adult man lying in bed with nonpurposeful movements  HENT:     Head: Normocephalic and atraumatic.     Mouth/Throat:     Mouth: Mucous membranes are dry.  Eyes:     Comments: Unable to open eyelids to examine eyes  Cardiovascular:     Rate and Rhythm: Normal rate and regular rhythm.  Pulmonary:     Effort: Pulmonary effort is normal.  Abdominal:     General: Bowel sounds are normal. There is no distension.     Palpations: Abdomen is soft.     Tenderness: There is no abdominal tenderness.  Musculoskeletal:        General: Swelling present.     Cervical back: No rigidity.  Skin:    General: Skin is warm and dry.  Neurological:     Comments: Does not follow commands.  Nonverbal.     Consultants:    Procedures:    Data Reviewed: No results found for this or any previous visit (from the past 24 hour(s)).   I have Reviewed nursing notes, Vitals, and Lab results since pt's last encounter. Pertinent lab results : see above I have reviewed the last note from staff over past 24 hours I have discussed pt's care plan and test results with nursing staff, case manager   LOS: 18 days   , MD [facility] 10/22/2021, 2:02 PM

## 2021-10-23 DIAGNOSIS — G931 Anoxic brain damage, not elsewhere classified: Secondary | ICD-10-CM | POA: Diagnosis not present

## 2021-10-23 LAB — CBC WITH DIFFERENTIAL/PLATELET
Abs Immature Granulocytes: 0.04 10*3/uL (ref 0.00–0.07)
Basophils Absolute: 0 10*3/uL (ref 0.0–0.1)
Basophils Relative: 0 %
Eosinophils Absolute: 0.1 10*3/uL (ref 0.0–0.5)
Eosinophils Relative: 2 %
HCT: 36.9 % — ABNORMAL LOW (ref 39.0–52.0)
Hemoglobin: 12.3 g/dL — ABNORMAL LOW (ref 13.0–17.0)
Immature Granulocytes: 1 %
Lymphocytes Relative: 20 %
Lymphs Abs: 1.4 10*3/uL (ref 0.7–4.0)
MCH: 31.5 pg (ref 26.0–34.0)
MCHC: 33.3 g/dL (ref 30.0–36.0)
MCV: 94.4 fL (ref 80.0–100.0)
Monocytes Absolute: 1.3 10*3/uL — ABNORMAL HIGH (ref 0.1–1.0)
Monocytes Relative: 18 %
Neutro Abs: 4.1 10*3/uL (ref 1.7–7.7)
Neutrophils Relative %: 59 %
Platelets: 227 10*3/uL (ref 150–400)
RBC: 3.91 MIL/uL — ABNORMAL LOW (ref 4.22–5.81)
RDW: 15.2 % (ref 11.5–15.5)
WBC: 7 10*3/uL (ref 4.0–10.5)
nRBC: 0 % (ref 0.0–0.2)

## 2021-10-23 LAB — COMPREHENSIVE METABOLIC PANEL
ALT: 17 U/L (ref 0–44)
AST: 18 U/L (ref 15–41)
Albumin: 2.4 g/dL — ABNORMAL LOW (ref 3.5–5.0)
Alkaline Phosphatase: 51 U/L (ref 38–126)
Anion gap: 7 (ref 5–15)
BUN: 30 mg/dL — ABNORMAL HIGH (ref 6–20)
CO2: 30 mmol/L (ref 22–32)
Calcium: 9.1 mg/dL (ref 8.9–10.3)
Chloride: 97 mmol/L — ABNORMAL LOW (ref 98–111)
Creatinine, Ser: 0.69 mg/dL (ref 0.61–1.24)
GFR, Estimated: >60 mL/min (ref >60)
Glucose, Bld: 119 mg/dL — ABNORMAL HIGH (ref 70–99)
Potassium: 4.6 mmol/L (ref 3.5–5.1)
Sodium: 134 mmol/L — ABNORMAL LOW (ref 135–145)
Total Bilirubin: 0.5 mg/dL (ref 0.3–1.2)
Total Protein: 6.1 g/dL — ABNORMAL LOW (ref 6.5–8.1)

## 2021-10-23 LAB — MAGNESIUM: Magnesium: 1.9 mg/dL (ref 1.7–2.4)

## 2021-10-23 NOTE — Progress Notes (Signed)
Progress Note    Billy Cherry   [id number]  DOB: 09-04-1970  DOA: 10/04/2021     19 PCP: Patient, No Pcp Per  Initial CC: no [facility]  Hospital Course: Billy Cherry is a 51 y.o. 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  Interval History:  No events overnight.  No change neurologically. He remains in a persistent vegetative state with extremely poor quality of life. Auth received. Patient to go home with wife tomorrow, 8/22.   Assessment and Plan:  S/p Cardiac arrest Postarrest anoxic encephalopathy Postarrest respiratory failure s/p tracheostomy S/P PEG tube placement 2D echocardiogram showed reduced LV function at <20% with global hypokinesis.  Now status post trach and PEG.  Remains with anoxic encephalopathy. -Continue Keppra - poor prognosis overall; professional recommendation is still transition to hospice/comfort care   Anoxic encephalopathy ([name]) MRI brain significant for hypoxic brain injury.  Nonpurposeful eye movements and unable to interact in any meaningful capacity.  Poor neurologic status has remained unchanged - Patient not likely to have any significant meaningful recovery - TENTATIVE dispo plan again being pursued for patient to go home with [facility] and care of his wife - he remains at risk for recurrent infections and imminent decline  Chronic respiratory failure with hypoxia ([name]) Status post trach collar. Now chronic. Continue O2. Can try weaning as able  -Continue treatment for secretions with scopolamine and Robinul - at risk for recurrent infections   DVT (deep venous thrombosis) ([name]) Duplex ultrasound of lower extremity on 05/15/2021 showed age  indeterminate deep vein thrombosis involving the left femoral vein, left proximal profunda vein, left posterior tibial veins, left popliteal vein, and left peroneal veins. -Continue Eliquis   Dysphagia On bolus feeds per dietitian -Aspiration precautions -Continue tube feeds   Pulmonary embolus ([name]) Noted to have possible acute on chronic pulmonary embolism on CT.  Also have some evidence of possible pulmonary hypertension.   -Continue Eliquis indefinitely    Pressure injury of skin - right heel stage 1 and right buttock stage 2 - continue wound care   HTN (hypertension) Blood pressure acceptable, follow  -Continue Coreg, Cardura, isosorbide dinitrate, spironolactone, and hydralazine   HFrEF (heart failure with reduced ejection fraction) ([name]) 2D echocardiogram-LVEF < 20%,  -Continue coreg, imdur, spironolactone   Myoclonus Seen by neurology. Per neuro notes, suppressing his myoclonus will not ultimately change the prognosis. -Continue Keppra, Klonopin   Peripheral edema -will monitor     Old records reviewed in assessment of this patient  Antimicrobials:   DVT prophylaxis:   apixaban (ELIQUIS) tablet 5 mg   Code Status:   Code Status: Full Code  Mobility Assessment (last 72 hours)     Mobility Assessment     Row Name 10/23/21 0930 10/21/21 1922 10/21/21 0800       Does patient have an order for bedrest or is patient medically unstable Yes- Bedfast (Level 1) - Complete Yes- Bedfast (Level 1) - Complete Yes- Bedfast (Level 1) - Complete     What is the highest level of mobility based on the progressive mobility assessment? Level 1 (Bedfast) - Unable to balance while sitting on edge of bed Level 1 (Bedfast) - Unable to balance while sitting on edge of bed Level  1 (Bedfast) - Unable to balance while sitting on edge of bed     Is the above level different from baseline mobility prior to current illness? Yes - Recommend PT order Yes - Recommend PT order Yes - Recommend  PT order              Barriers to discharge: Awaiting insurance approval for [facility]; finally received on 8/21, wife to take patient home on 8/22 Disposition Plan:  Home Status is: Inpt  Objective: Blood pressure 138/78, pulse 85, temperature 98.3 F (36.8 C), temperature source Oral, resp. rate 18, height 5\' 10"  (1.778 m), weight 94.2 kg, SpO2 97 %.  Examination:  Physical Exam Constitutional:      Comments: Chronically ill-appearing adult man lying in bed with nonpurposeful movements  HENT:     Head: Normocephalic and atraumatic.     Mouth/Throat:     Mouth: Mucous membranes are dry.  Eyes:     Comments: Unable to open eyelids to examine eyes  Cardiovascular:     Rate and Rhythm: Normal rate and regular rhythm.  Pulmonary:     Effort: Pulmonary effort is normal.  Abdominal:     General: Bowel sounds are normal. There is no distension.     Palpations: Abdomen is soft.     Tenderness: There is no abdominal tenderness.  Musculoskeletal:        General: Swelling present.     Cervical back: No rigidity.  Skin:    General: Skin is warm and dry.  Neurological:     Comments: Does not follow commands.  Nonverbal.     Consultants:    Procedures:    Data Reviewed: Results for orders placed or performed during the hospital encounter of 10/04/21 (from the past 24 hour(s))  CBC with Differential/Platelet     Status: Abnormal   Collection Time: 10/23/21  4:15 AM  Result Value Ref Range   WBC 7.0 4.0 - 10.5 K/uL   RBC 3.91 (L) 4.22 - 5.81 MIL/uL   Hemoglobin 12.3 (L) 13.0 - 17.0 g/dL   HCT 10/25/21 (L) 85.6 - 31.4 %   MCV 94.4 80.0 - 100.0 fL   MCH 31.5 26.0 - 34.0 pg   MCHC 33.3 30.0 - 36.0 g/dL   RDW 97.0 26.3 - 78.5 %   Platelets 227 150 - 400 K/uL   nRBC 0.0 0.0 - 0.2 %   Neutrophils Relative % 59 %   Neutro Abs 4.1 1.7 - 7.7 K/uL   Lymphocytes Relative 20 %   Lymphs Abs 1.4 0.7 - 4.0 K/uL   Monocytes Relative 18 %   Monocytes Absolute 1.3 (H) 0.1 - 1.0 K/uL    Eosinophils Relative 2 %   Eosinophils Absolute 0.1 0.0 - 0.5 K/uL   Basophils Relative 0 %   Basophils Absolute 0.0 0.0 - 0.1 K/uL   Immature Granulocytes 1 %   Abs Immature Granulocytes 0.04 0.00 - 0.07 K/uL  Comprehensive metabolic panel     Status: Abnormal   Collection Time: 10/23/21  4:15 AM  Result Value Ref Range   Sodium 134 (L) 135 - 145 mmol/L   Potassium 4.6 3.5 - 5.1 mmol/L   Chloride 97 (L) 98 - 111 mmol/L   CO2 30 22 - 32 mmol/L   Glucose, Bld 119 (H) 70 - 99 mg/dL   BUN 30 (H) 6 - 20 mg/dL   Creatinine, Ser 10/25/21 0.61 - 1.24 mg/dL   Calcium 9.1 8.9 - 0.27 mg/dL  Total Protein 6.1 (L) 6.5 - 8.1 g/dL   Albumin 2.4 (L) 3.5 - 5.0 g/dL   AST 18 15 - 41 U/L   ALT 17 0 - 44 U/L   Alkaline Phosphatase 51 38 - 126 U/L   Total Bilirubin 0.5 0.3 - 1.2 mg/dL   GFR, Estimated >60 >60 mL/min   Anion gap 7 5 - 15  Magnesium     Status: None   Collection Time: 10/23/21  4:15 AM  Result Value Ref Range   Magnesium 1.9 1.7 - 2.4 mg/dL     I have Reviewed nursing notes, Vitals, and Lab results since pt's last encounter. Pertinent lab results : see above I have reviewed the last note from staff over past 24 hours I have discussed pt's care plan and test results with nursing staff, case manager   LOS: 19 days   Dwyane Dee, MD [facility] 10/23/2021, 2:23 PM

## 2021-10-23 NOTE — Progress Notes (Addendum)
2pm: CSW spoke with Vance Gather at [facility] who states the patient can be discharged home tomorrow, 10/24/2021 between 12pm and 1pm.  CSW spoke with [facility] to arranged transport home via ALS truck with a pick up time of 12pm. [facility] requesting patient be deep suctioned prior to leaving the unit - CSW notified RN.  10am: CSW spoke with Vance Gather at [employer] who states insurance authorization has been approved. Vance Gather is coordinating with patient's wife to determine a time for discharge.  Edwin Dada, MSW, LCSW Transitions of Care  Clinical Social Worker II [phone]

## 2021-10-24 DIAGNOSIS — G931 Anoxic brain damage, not elsewhere classified: Secondary | ICD-10-CM | POA: Diagnosis not present

## 2021-10-24 DIAGNOSIS — I2699 Other pulmonary embolism without acute cor pulmonale: Secondary | ICD-10-CM | POA: Diagnosis not present

## 2021-10-24 DIAGNOSIS — J9611 Chronic respiratory failure with hypoxia: Secondary | ICD-10-CM | POA: Diagnosis not present

## 2021-10-24 DIAGNOSIS — I10 Essential (primary) hypertension: Secondary | ICD-10-CM | POA: Diagnosis not present

## 2021-10-24 LAB — CBC WITH DIFFERENTIAL/PLATELET
Abs Immature Granulocytes: 0.05 10*3/uL (ref 0.00–0.07)
Basophils Absolute: 0 10*3/uL (ref 0.0–0.1)
Basophils Relative: 0 %
Eosinophils Absolute: 0.1 10*3/uL (ref 0.0–0.5)
Eosinophils Relative: 2 %
HCT: 37.1 % — ABNORMAL LOW (ref 39.0–52.0)
Hemoglobin: 12.6 g/dL — ABNORMAL LOW (ref 13.0–17.0)
Immature Granulocytes: 1 %
Lymphocytes Relative: 18 %
Lymphs Abs: 1.3 10*3/uL (ref 0.7–4.0)
MCH: 31.7 pg (ref 26.0–34.0)
MCHC: 34 g/dL (ref 30.0–36.0)
MCV: 93.5 fL (ref 80.0–100.0)
Monocytes Absolute: 1.2 10*3/uL — ABNORMAL HIGH (ref 0.1–1.0)
Monocytes Relative: 16 %
Neutro Abs: 4.8 10*3/uL (ref 1.7–7.7)
Neutrophils Relative %: 63 %
Platelets: 238 10*3/uL (ref 150–400)
RBC: 3.97 MIL/uL — ABNORMAL LOW (ref 4.22–5.81)
RDW: 15.1 % (ref 11.5–15.5)
WBC: 7.5 10*3/uL (ref 4.0–10.5)
nRBC: 0 % (ref 0.0–0.2)

## 2021-10-24 LAB — COMPREHENSIVE METABOLIC PANEL
ALT: 16 U/L (ref 0–44)
AST: 17 U/L (ref 15–41)
Albumin: 2.4 g/dL — ABNORMAL LOW (ref 3.5–5.0)
Alkaline Phosphatase: 53 U/L (ref 38–126)
Anion gap: 9 (ref 5–15)
BUN: 29 mg/dL — ABNORMAL HIGH (ref 6–20)
CO2: 29 mmol/L (ref 22–32)
Calcium: 9.3 mg/dL (ref 8.9–10.3)
Chloride: 97 mmol/L — ABNORMAL LOW (ref 98–111)
Creatinine, Ser: 0.75 mg/dL (ref 0.61–1.24)
GFR, Estimated: >60 mL/min (ref >60)
Glucose, Bld: 105 mg/dL — ABNORMAL HIGH (ref 70–99)
Potassium: 4.6 mmol/L (ref 3.5–5.1)
Sodium: 135 mmol/L (ref 135–145)
Total Bilirubin: 0.2 mg/dL — ABNORMAL LOW (ref 0.3–1.2)
Total Protein: 6.4 g/dL — ABNORMAL LOW (ref 6.5–8.1)

## 2021-10-24 LAB — MAGNESIUM: Magnesium: 1.9 mg/dL (ref 1.7–2.4)

## 2021-10-24 NOTE — Discharge Summary (Signed)
Physician Discharge Summary   Patient: Billy Cherry MRN: [id number] DOB: 30-Aug-1970  Admit date:     10/04/2021  Discharge date: 10/24/21  Discharge Physician: Hosie Poisson     Recommendations at discharge:  PLEASE follow up with PCP in 1 to 2 weeks.  Please follow up with home health as recommended.  Would recommend to follow up with outpatient palliative care as appropriate.   Discharge Diagnoses: Principal Problem:   Anoxic brain injury ([place]) Active Problems:   History of cardiac arrest   Chronic respiratory failure with hypoxia ([name])   DVT (deep venous thrombosis) ([name])   Pulmonary embolus ([name])   Dysphagia   HTN (hypertension)   Chronic systolic CHF (congestive heart failure) ([name])   Myoclonus   Tracheostomy dependent ([name])   S/P percutaneous endoscopic gastrostomy (PEG) tube placement ([name])   Pressure injury of buttock, stage 2 ([name])   Pressure injury of right heel, stage 1    Hospital Course: Billy Cherry is a 51 y.o. 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  Assessment and Plan:  S/p Cardiac arrest Postarrest anoxic encephalopathy Postarrest respiratory failure s/p tracheostomy S/P PEG tube placement 2D echocardiogram showed reduced LV function at <20% with global hypokinesis.  Now status post trach and PEG.  Remains with anoxic encephalopathy. -Continue Keppra - poor prognosis overall; professional recommendation is still transition to hospice/comfort care   Anoxic encephalopathy ([place]) MRI brain significant for hypoxic brain injury.  Nonpurposeful eye movements and unable to interact in any meaningful capacity.  Poor neurologic status has remained unchanged -  Patient not likely to have any significant meaningful recovery - TENTATIVE dispo plan again being pursued for patient to go home with [facility] and care of his wife - he remains at risk for recurrent infections and imminent decline   Chronic respiratory failure with hypoxia ([place]) Status post trach collar. Now chronic. Continue O2. Can try weaning as able  -Continue treatment for secretions with scopolamine and Robinul - at risk for recurrent infections   DVT (deep venous thrombosis) ([name]) Duplex ultrasound of lower extremity on 05/15/2021 showed age indeterminate deep vein thrombosis involving the left femoral vein, left proximal profunda vein, left posterior tibial veins, left popliteal vein, and left peroneal veins. -Continue Eliquis   Dysphagia On bolus feeds per dietitian -Aspiration precautions -Continue tube feeds   Pulmonary embolus ([place]) Noted to have possible acute on chronic pulmonary embolism on CT.  Also have some evidence of possible pulmonary hypertension.   -Continue Eliquis indefinitely    Pressure injury of skin - right heel stage 1 and right buttock stage 2 - continue wound care   HTN (hypertension) Blood pressure acceptable, follow  -Continue Coreg, Cardura, isosorbide dinitrate, spironolactone, and hydralazine   HFrEF (heart failure with reduced ejection fraction) ([name]) 2D echocardiogram-LVEF < 20%,  -Continue coreg, imdur, spironolactone   Myoclonus Seen by neurology. Per neuro notes, suppressing his myoclonus will not ultimately change the prognosis. -Continue Keppra, Klonopin   Peripheral edema -will monitor           Consultants: none.  Procedures performed: nne.   Disposition: Home Diet recommendation:  NPO DISCHARGE MEDICATION: Allergies as of 10/24/2021       Reactions   Zestril [lisinopril] Swelling  angioedema        Medication List     STOP taking these medications    pantoprazole 2 mg/mL suspension Commonly known as: PROTONIX    pantoprazole sodium 40 mg Commonly known as: PROTONIX       TAKE these medications    acetaminophen 325 MG tablet Commonly known as: TYLENOL Place 2 tablets (650 mg total) into feeding tube every 6 (six) hours as needed for mild pain or fever.   atropine 1 % ophthalmic solution Place 2 drops under the tongue 4 (four) times daily.   carvedilol 25 MG tablet Commonly known as: COREG Place 1 tablet (25 mg total) into feeding tube 2 (two) times daily with a meal.   chlorhexidine gluconate (MEDLINE KIT) 0.12 % solution Commonly known as: PERIDEX 15 mLs by Mouth Rinse route 2 (two) times daily.   clonazePAM 0.5 MG tablet Commonly known as: KLONOPIN Place 1 tablet (0.5 mg total) into feeding tube 2 (two) times daily. What changed: when to take this   doxazosin 2 MG tablet Commonly known as: CARDURA Place 1 tablet (2 mg total) into feeding tube daily.   Eliquis 5 MG Tabs tablet Generic drug: apixaban Place 1 tablet (5 mg total) into feeding tube 2 (two) times daily.   famotidine 20 MG tablet Commonly known as: PEPCID Place 1 tablet (20 mg total) into feeding tube 2 (two) times daily.   feeding supplement (PROSource TF) liquid Place 45 mLs into feeding tube 3 (three) times daily. What changed: when to take this   feeding supplement (OSMOLITE 1.5 CAL) Liqd Place 1,000 mLs into feeding tube continuous. What changed: Another medication with the same name was changed. Make sure you understand how and when to take each.   fiber Pack packet Place 1 packet into feeding tube 2 (two) times daily.   free water Soln Place 200 mLs into feeding tube every 6 (six) hours.   Gerhardt's butt cream Crea Apply 1 Application topically daily.   glycopyrrolate 1 MG tablet Commonly known as: ROBINUL Place 1 tablet (1 mg total) into feeding tube 3 (three) times daily.   hydrALAZINE 50 MG tablet Commonly known as: APRESOLINE Place 1 tablet (50 mg total) into feeding tube 3 (three) times  daily.   Imodium A-D 1 MG/7.5ML Liqd Generic drug: Loperamide HCl Place 15 mLs (2 mg total) into feeding tube daily as needed for diarrhea or loose stools.   ipratropium-albuterol 0.5-2.5 (3) MG/3ML Soln Commonly known as: DUONEB Take 3 mLs by nebulization every 4 (four) hours as needed (wheezing, shortness of breath).   isosorbide dinitrate 30 MG tablet Commonly known as: ISORDIL Place 1 tablet (30 mg total) into feeding tube 3 (three) times daily.   levETIRAcetam 100 MG/ML solution Commonly known as: KEPPRA Place 7.5 mLs (750 mg total) into feeding tube 2 (two) times daily.   mouth rinse Liqd solution 15 mLs by Mouth Rinse route every 4 (four) hours.   polyethylene glycol powder 17 GM/SCOOP powder Commonly known as: GLYCOLAX/MIRALAX Place 1 capful  (17 g) into feeding tube daily as needed for mild constipation or moderate constipation.   scopolamine 1 MG/3DAYS Commonly known as: TRANSDERM-SCOP Place 1 patch (1.5 mg total) onto the skin every 3 (three) days.   spironolactone 25 MG tablet Commonly known as: ALDACTONE Place 1 tablet (25 mg total) into feeding tube daily.   valproic acid 250 MG/5ML solution Commonly known as: DEPAKENE Place 15 mLs (750 mg total) into feeding tube 3 (three) times daily.  Follow-up Information     [facility] Follow up.   Contact information: [place] [phone]        [facility] Follow up.   Why: [employer] will be providing DME at the home.  Please call the above number for supply needs for home. Contact information: [place] [address] [phone]                Discharge Exam: Filed Weights   10/21/21 0500 10/22/21 0500 10/23/21 0500  Weight: 95.2 kg 94.6 kg 94.2 kg   General exam: Ill appearing gentleman, in bed non responsive.  Respiratory system: Clear to auscultation. Respiratory effort normal. Cardiovascular  system: S1 & S2 heard, RRR. No JVD, No pedal edema. Gastrointestinal system: Abdomen is nondistended, soft and nontender. Central nervous system: non verbal , does not follow commands.  Extremities: Symmetric 5 x 5 power. Skin: PRESSURE INJURY in the  back.     Condition at discharge: poor  The results of significant diagnostics from this hospitalization (including imaging, microbiology, ancillary and laboratory) are listed below for reference.   Imaging Studies: No results found.  Microbiology: Results for orders placed or performed during the hospital encounter of 03/30/21  Culture, Respiratory w Gram Stain     Status: None   Collection Time: 04/04/21  7:57 AM   Specimen: Tracheal Aspirate; Respiratory  Result Value Ref Range Status   Specimen Description TRACHEAL ASPIRATE  Final   Special Requests NONE  Final   Gram Stain   Final    ABUNDANT WBC PRESENT, PREDOMINANTLY PMN ABUNDANT GRAM POSITIVE COCCI IN PAIRS IN CLUSTERS ABUNDANT GRAM POSITIVE RODS RARE GRAM NEGATIVE RODS    Culture   Final    ABUNDANT STAPHYLOCOCCUS AUREUS MODERATE DIPHTHEROIDS(CORYNEBACTERIUM SPECIES) Standardized susceptibility testing for this organism is not available. Performed at [facility], [place] [address] [place], [place] [address]    Report Status 04/06/2021 FINAL  Final   Organism ID, Bacteria STAPHYLOCOCCUS AUREUS  Final      Susceptibility   Staphylococcus aureus - MIC*    CIPROFLOXACIN <=0.5 SENSITIVE Sensitive     ERYTHROMYCIN <=0.25 SENSITIVE Sensitive     GENTAMICIN <=0.5 SENSITIVE Sensitive     OXACILLIN <=0.25 SENSITIVE Sensitive     TETRACYCLINE <=1 SENSITIVE Sensitive     VANCOMYCIN <=0.5 SENSITIVE Sensitive     TRIMETH/SULFA <=10 SENSITIVE Sensitive     CLINDAMYCIN <=0.25 SENSITIVE Sensitive     RIFAMPIN <=0.5 SENSITIVE Sensitive     Inducible Clindamycin NEGATIVE Sensitive     * ABUNDANT STAPHYLOCOCCUS AUREUS  Culture, Respiratory w Gram Stain     Status: None    Collection Time: 04/10/21  8:34 AM   Specimen: Tracheal Aspirate; Respiratory  Result Value Ref Range Status   Specimen Description TRACHEAL ASPIRATE  Final   Special Requests NONE  Final   Gram Stain   Final    RARE WBC PRESENT, PREDOMINANTLY MONONUCLEAR RARE GRAM POSITIVE COCCI IN PAIRS RARE GRAM NEGATIVE RODS    Culture   Final    ABUNDANT STAPHYLOCOCCUS AUREUS ABUNDANT FLAVOBACTERIUM [place] DIPHTHEROIDS(CORYNEBACTERIUM SPECIES) Standardized susceptibility testing for this organism is not available. Performed at [facility], [place] [address], [place], [place] [address]    Report Status 04/13/2021 FINAL  Final   Organism ID, Bacteria STAPHYLOCOCCUS AUREUS  Final   Organism ID, Bacteria FLAVOBACTERIUM ODORATUM  Final      Susceptibility  Flavobacterium odoratum - MIC*    CEFAZOLIN >=64 RESISTANT Resistant     CEFTAZIDIME >=64 RESISTANT Resistant     CIPROFLOXACIN 1 SENSITIVE Sensitive     GENTAMICIN >=16 RESISTANT Resistant     IMIPENEM 8 INTERMEDIATE Intermediate     TRIMETH/SULFA 40 SENSITIVE Sensitive     PIP/TAZO 16 SENSITIVE Sensitive     * ABUNDANT FLAVOBACTERIUM ODORATUM   Staphylococcus aureus - MIC*    CIPROFLOXACIN <=0.5 SENSITIVE Sensitive     ERYTHROMYCIN <=0.25 SENSITIVE Sensitive     GENTAMICIN <=0.5 SENSITIVE Sensitive     OXACILLIN <=0.25 SENSITIVE Sensitive     TETRACYCLINE <=1 SENSITIVE Sensitive     VANCOMYCIN <=0.5 SENSITIVE Sensitive     TRIMETH/SULFA <=10 SENSITIVE Sensitive     CLINDAMYCIN <=0.25 SENSITIVE Sensitive     RIFAMPIN <=0.5 SENSITIVE Sensitive     Inducible Clindamycin NEGATIVE Sensitive     * ABUNDANT STAPHYLOCOCCUS AUREUS  Culture, blood (routine x 2)     Status: None   Collection Time: 04/10/21  9:00 AM   Specimen: BLOOD RIGHT HAND  Result Value Ref Range Status   Specimen Description BLOOD RIGHT HAND  Final   Special Requests   Final    BOTTLES DRAWN AEROBIC AND ANAEROBIC Blood Culture adequate volume   Culture    Final    NO GROWTH 5 DAYS Performed at [facility], [address] [place], [place] [address]    Report Status 04/15/2021 FINAL  Final  Culture, blood (routine x 2)     Status: None   Collection Time: 04/10/21  9:08 AM   Specimen: BLOOD LEFT HAND  Result Value Ref Range Status   Specimen Description BLOOD LEFT HAND  Final   Special Requests   Final    BOTTLES DRAWN AEROBIC AND ANAEROBIC Blood Culture adequate volume   Culture   Final    NO GROWTH 5 DAYS Performed at [facility], [place] [address] [place], [place] [address]    Report Status 04/15/2021 FINAL  Final  Culture, blood (x 2)     Status: None   Collection Time: 04/20/21  8:16 AM   Specimen: BLOOD LEFT WRIST  Result Value Ref Range Status   Specimen Description BLOOD LEFT WRIST  Final   Special Requests   Final    BOTTLES DRAWN AEROBIC AND ANAEROBIC Blood Culture adequate volume   Culture   Final    NO GROWTH 5 DAYS Performed at [facility], [place] [address], [place], [place] [address]    Report Status 04/25/2021 FINAL  Final  Culture, blood (x 2)     Status: None   Collection Time: 04/20/21  8:16 AM   Specimen: BLOOD RIGHT ARM  Result Value Ref Range Status   Specimen Description BLOOD RIGHT ARM  Final   Special Requests   Final    BOTTLES DRAWN AEROBIC AND ANAEROBIC Blood Culture adequate volume   Culture   Final    NO GROWTH 5 DAYS Performed at [facility], [place] [address] [place],  [address]    Report Status 04/25/2021 FINAL  Final  Culture, Respiratory w Gram Stain     Status: None   Collection Time: 04/20/21 11:01 AM   Specimen: Tracheal Aspirate  Result Value Ref Range Status   Specimen Description TRACHEAL ASPIRATE  Final   Special Requests NONE  Final   Gram Stain   Final    ABUNDANT WBC PRESENT, PREDOMINANTLY PMN ABUNDANT  GRAM NEGATIVE RODS FEW GRAM POSITIVE COCCI IN PAIRS Performed at [facility], [place] [address] [place], [place] [address]    Culture  ABUNDANT PSEUDOMONAS AERUGINOSA  Final   Report Status 04/23/2021 FINAL  Final   Organism ID, Bacteria PSEUDOMONAS AERUGINOSA  Final      Susceptibility   Pseudomonas aeruginosa - MIC*    CEFTAZIDIME 4 SENSITIVE Sensitive     CIPROFLOXACIN <=0.25 SENSITIVE Sensitive     GENTAMICIN <=1 SENSITIVE Sensitive     IMIPENEM 2 SENSITIVE Sensitive     PIP/TAZO 8 SENSITIVE Sensitive     CEFEPIME 2 SENSITIVE Sensitive     * ABUNDANT PSEUDOMONAS AERUGINOSA  Resp Panel by RT-PCR (Flu A&B, Covid) Nasopharyngeal Swab     Status: None   Collection Time: 04/21/21 10:27 AM   Specimen: Nasopharyngeal Swab; Nasopharyngeal(NP) swabs in vial transport medium  Result Value Ref Range Status   SARS Coronavirus 2 by RT PCR NEGATIVE NEGATIVE Final    Comment: (NOTE) SARS-CoV-2 target nucleic acids are NOT DETECTED.  The SARS-CoV-2 RNA is generally detectable in upper respiratory specimens during the acute phase of infection. The lowest concentration of SARS-CoV-2 viral copies this assay can detect is 138 copies/mL. A negative result does not preclude SARS-Cov-2 infection and should not be used as the sole basis for treatment or other patient management decisions. A negative result may occur with  improper specimen collection/handling, submission of specimen other than nasopharyngeal swab, presence of viral mutation(s) within the areas targeted by this assay, and inadequate number of viral copies(<138 copies/mL). A negative result must be combined with clinical observations, patient history, and epidemiological information. The expected result is Negative.  Fact Sheet for Patients:  EntrepreneurPulse.com.au  Fact Sheet for Healthcare Providers:  IncredibleEmployment.be  This test is no t yet approved or cleared by the [place] FDA and  has been authorized for detection and/or diagnosis of SARS-CoV-2 by FDA under an Emergency Use Authorization (EUA). This EUA will  remain  in effect (meaning this test can be used) for the duration of the COVID-19 declaration under Section 564(b)(1) of the Act, 21 U.S.C.section 360bbb-3(b)(1), unless the authorization is terminated  or revoked sooner.       Influenza A by PCR NEGATIVE NEGATIVE Final   Influenza B by PCR NEGATIVE NEGATIVE Final    Comment: (NOTE) The Xpert Xpress SARS-CoV-2/FLU/RSV plus assay is intended as an aid in the diagnosis of influenza from Nasopharyngeal swab specimens and should not be used as a sole basis for treatment. Nasal washings and aspirates are unacceptable for Xpert Xpress SARS-CoV-2/FLU/RSV testing.  Fact Sheet for Patients: EntrepreneurPulse.com.au  Fact Sheet for Healthcare Providers: IncredibleEmployment.be  This test is not yet approved or cleared by the [place] FDA and has been authorized for detection and/or diagnosis of SARS-CoV-2 by FDA under an Emergency Use Authorization (EUA). This EUA will remain in effect (meaning this test can be used) for the duration of the COVID-19 declaration under Section 564(b)(1) of the Act, 21 U.S.C. section 360bbb-3(b)(1), unless the authorization is terminated or revoked.  Performed at [facility], [place] [address], [name], [place] [address]   Culture, Respiratory w Gram Stain     Status: None   Collection Time: 05/02/21  7:13 AM   Specimen: Tracheal Aspirate; Respiratory  Result Value Ref Range Status   Specimen Description TRACHEAL ASPIRATE  Final   Special Requests NONE  Final   Gram Stain   Final    MODERATE SQUAMOUS  EPITHELIAL CELLS PRESENT ABUNDANT WBC PRESENT, PREDOMINANTLY PMN FEW GRAM NEGATIVE RODS MODERATE GRAM POSITIVE COCCI    Culture   Final    FEW PSEUDOMONAS AERUGINOSA WITHIN MIXED NORMAL RESPIRATORY FLORA Performed at [facility], [place] [address], [place], [place] [address]    Report Status 05/05/2021 FINAL  Final   Organism ID, Bacteria PSEUDOMONAS  AERUGINOSA  Final      Susceptibility   Pseudomonas aeruginosa - MIC*    CEFTAZIDIME 4 SENSITIVE Sensitive     CIPROFLOXACIN <=0.25 SENSITIVE Sensitive     GENTAMICIN <=1 SENSITIVE Sensitive     IMIPENEM 2 SENSITIVE Sensitive     PIP/TAZO 8 SENSITIVE Sensitive     CEFEPIME 2 SENSITIVE Sensitive     * FEW PSEUDOMONAS AERUGINOSA  Culture, blood (routine x 2)     Status: Abnormal   Collection Time: 05/07/21 12:30 PM   Specimen: BLOOD RIGHT HAND  Result Value Ref Range Status   Specimen Description BLOOD RIGHT HAND  Final   Special Requests   Final    BOTTLES DRAWN AEROBIC AND ANAEROBIC Blood Culture results may not be optimal due to an inadequate volume of blood received in culture bottles   Culture  Setup Time   Final    GRAM POSITIVE COCCI IN CLUSTERS IN BOTH AEROBIC AND ANAEROBIC BOTTLES CRITICAL RESULT CALLED TO, READ BACK BY AND VERIFIED WITH: PHARM D E.UELAND ON [id number] AT 7948 BY E.PARRISH    Culture (A)  Final    STAPHYLOCOCCUS EPIDERMIDIS THE SIGNIFICANCE OF ISOLATING THIS ORGANISM FROM A SINGLE SET OF BLOOD CULTURES WHEN MULTIPLE SETS ARE DRAWN IS UNCERTAIN. PLEASE NOTIFY THE MICROBIOLOGY DEPARTMENT WITHIN ONE WEEK IF SPECIATION AND SENSITIVITIES ARE REQUIRED. STAPHYLOCOCCUS CAPITIS SUSCEPTIBILITIES PERFORMED ON PREVIOUS CULTURE WITHIN THE LAST 5 DAYS. Performed at [facility], [place] [address], [place], [place] [address]    Report Status 05/10/2021 FINAL  Final  Blood Culture ID Panel (Reflexed)     Status: Abnormal   Collection Time: 05/07/21 12:30 PM  Result Value Ref Range Status   Enterococcus faecalis NOT DETECTED NOT DETECTED Final   Enterococcus Faecium NOT DETECTED NOT DETECTED Final   Listeria monocytogenes NOT DETECTED NOT DETECTED Final   Staphylococcus species DETECTED (A) NOT DETECTED Final    Comment: CRITICAL RESULT CALLED TO, READ BACK BY AND VERIFIED WITH: Vena Austria E.UELAND ON [id number] AT 1424 BY E.PARRISH    Staphylococcus aureus (BCID) NOT  DETECTED NOT DETECTED Final   Staphylococcus epidermidis DETECTED (A) NOT DETECTED Final    Comment: Methicillin (oxacillin) resistant coagulase negative staphylococcus. Possible blood culture contaminant (unless isolated from more than one blood culture draw or clinical case suggests pathogenicity). No antibiotic treatment is indicated for blood  culture contaminants. CRITICAL RESULT CALLED TO, READ BACK BY AND VERIFIED WITH: Vena Austria E.UELAND ON [id number] AT 0100 BY E.PARRISH    Staphylococcus lugdunensis NOT DETECTED NOT DETECTED Final   Streptococcus species NOT DETECTED NOT DETECTED Final   Streptococcus agalactiae NOT DETECTED NOT DETECTED Final   Streptococcus pneumoniae NOT DETECTED NOT DETECTED Final   Streptococcus pyogenes NOT DETECTED NOT DETECTED Final   A.calcoaceticus-baumannii NOT DETECTED NOT DETECTED Final   Bacteroides fragilis NOT DETECTED NOT DETECTED Final   Enterobacterales NOT DETECTED NOT DETECTED Final   Enterobacter cloacae complex NOT DETECTED NOT DETECTED Final   Escherichia coli NOT DETECTED NOT DETECTED Final   Klebsiella aerogenes NOT DETECTED NOT DETECTED Final   Klebsiella oxytoca NOT DETECTED NOT DETECTED Final  Klebsiella pneumoniae NOT DETECTED NOT DETECTED Final   Proteus species NOT DETECTED NOT DETECTED Final   Salmonella species NOT DETECTED NOT DETECTED Final   Serratia marcescens NOT DETECTED NOT DETECTED Final   Haemophilus influenzae NOT DETECTED NOT DETECTED Final   Neisseria meningitidis NOT DETECTED NOT DETECTED Final   Pseudomonas aeruginosa NOT DETECTED NOT DETECTED Final   Stenotrophomonas maltophilia NOT DETECTED NOT DETECTED Final   Candida albicans NOT DETECTED NOT DETECTED Final   Candida auris NOT DETECTED NOT DETECTED Final   Candida glabrata NOT DETECTED NOT DETECTED Final   Candida krusei NOT DETECTED NOT DETECTED Final   Candida parapsilosis NOT DETECTED NOT DETECTED Final   Candida tropicalis NOT DETECTED NOT DETECTED Final    Cryptococcus neoformans/gattii NOT DETECTED NOT DETECTED Final   Methicillin resistance mecA/C DETECTED (A) NOT DETECTED Final    Comment: CRITICAL RESULT CALLED TO, READ BACK BY AND VERIFIED WITH: Nevin Bloodgood ON [id number] AT 1424 BY E.PARRISH Performed at [facility], [place] [address], [place], [place] [address]   Culture, blood (routine x 2)     Status: Abnormal   Collection Time: 05/07/21 12:37 PM   Specimen: BLOOD RIGHT HAND  Result Value Ref Range Status   Specimen Description BLOOD RIGHT HAND  Final   Special Requests   Final    BOTTLES DRAWN AEROBIC ONLY Blood Culture adequate volume   Culture  Setup Time   Final    GRAM POSITIVE COCCI IN CLUSTERS AEROBIC BOTTLE ONLY CRITICAL VALUE NOTED.  VALUE IS CONSISTENT WITH PREVIOUSLY REPORTED AND CALLED VALUE. Performed at [facility], [place] [address], [place], [place] [address]    Culture STAPHYLOCOCCUS CAPITIS (A)  Final   Report Status 05/10/2021 FINAL  Final   Organism ID, Bacteria STAPHYLOCOCCUS CAPITIS  Final      Susceptibility   Staphylococcus capitis - MIC*    CIPROFLOXACIN >=8 RESISTANT Resistant     ERYTHROMYCIN >=8 RESISTANT Resistant     GENTAMICIN 8 INTERMEDIATE Intermediate     OXACILLIN >=4 RESISTANT Resistant     TETRACYCLINE 2 SENSITIVE Sensitive     VANCOMYCIN 1 SENSITIVE Sensitive     TRIMETH/SULFA <=10 SENSITIVE Sensitive     CLINDAMYCIN >=8 RESISTANT Resistant     RIFAMPIN <=0.5 SENSITIVE Sensitive     Inducible Clindamycin NEGATIVE Sensitive     * STAPHYLOCOCCUS CAPITIS  Culture, blood (routine x 2)     Status: None   Collection Time: 05/09/21 10:45 AM   Specimen: BLOOD RIGHT WRIST  Result Value Ref Range Status   Specimen Description BLOOD RIGHT WRIST  Final   Special Requests   Final    BOTTLES DRAWN AEROBIC ONLY Blood Culture results may not be optimal due to an inadequate volume of blood received in culture bottles   Culture   Final    NO GROWTH 5 DAYS Performed at [facility], [place] [address], [place], [place] [address]    Report Status 05/14/2021 FINAL  Final  Culture, blood (routine x 2)     Status: None   Collection Time: 05/09/21 10:46 AM   Specimen: BLOOD RIGHT HAND  Result Value Ref Range Status   Specimen Description BLOOD RIGHT HAND  Final   Special Requests   Final    BOTTLES DRAWN AEROBIC AND ANAEROBIC Blood Culture adequate volume   Culture   Final    NO GROWTH 5 DAYS Performed at [facility], [place],  [place] [address]    Report Status 05/14/2021 FINAL  Final  Culture, blood (routine x 2)     Status: None   Collection Time: 06/06/21  6:25 AM   Specimen: BLOOD RIGHT HAND  Result Value Ref Range Status   Specimen Description BLOOD RIGHT HAND  Final   Special Requests   Final    BOTTLES DRAWN AEROBIC AND ANAEROBIC Blood Culture adequate volume   Culture   Final    NO GROWTH 5 DAYS Performed at [facility], [place] [address], [place], [place] [address]    Report Status 06/11/2021 FINAL  Final  Culture, blood (routine x 2)     Status: None   Collection Time: 06/06/21  6:38 AM   Specimen: BLOOD RIGHT HAND  Result Value Ref Range Status   Specimen Description BLOOD RIGHT HAND  Final   Special Requests   Final    BOTTLES DRAWN AEROBIC AND ANAEROBIC Blood Culture adequate volume   Culture   Final    NO GROWTH 5 DAYS Performed at [facility], [place] [address] [place], [place] [address]    Report Status 06/11/2021 FINAL  Final  Culture, Respiratory w Gram Stain     Status: None   Collection Time: 06/06/21  9:00 AM   Specimen: Tracheal Aspirate; Respiratory  Result Value Ref Range Status   Specimen Description TRACHEAL ASPIRATE  Final   Special Requests NONE  Final   Gram Stain   Final    MODERATE WBC PRESENT,BOTH PMN AND MONONUCLEAR MODERATE GRAM POSITIVE RODS MODERATE GRAM NEGATIVE RODS RARE GRAM POSITIVE COCCI IN CHAINS    Culture   Final    FEW Consistent with normal respiratory flora. No  Pseudomonas species isolated Performed at [place] [address] [place], [place] [address]    Report Status 06/08/2021 FINAL  Final  Culture, blood (Routine X 2) w Reflex to ID Panel     Status: None   Collection Time: 06/13/21  8:58 AM   Specimen: BLOOD RIGHT HAND  Result Value Ref Range Status   Specimen Description BLOOD RIGHT HAND  Final   Special Requests   Final    BOTTLES DRAWN AEROBIC AND ANAEROBIC Blood Culture results may not be optimal due to an inadequate volume of blood received in culture bottles   Culture   Final    NO GROWTH 5 DAYS Performed at [facility], [place] [address], [place], [place] [address]    Report Status 06/18/2021 FINAL  Final  Culture, blood (single) w Reflex to ID Panel     Status: None   Collection Time: 06/17/21 11:13 AM   Specimen: BLOOD LEFT ARM  Result Value Ref Range Status   Specimen Description BLOOD LEFT ARM  Final   Special Requests   Final    BOTTLES DRAWN AEROBIC AND ANAEROBIC Blood Culture adequate volume   Culture   Final    NO GROWTH 5 DAYS Performed at [facility], [place] [address] [place], [place] [address]    Report Status 06/22/2021 FINAL  Final  Culture, blood (Routine X 2) w Reflex to ID Panel     Status: None   Collection Time: 08/15/21  3:11 PM   Specimen: BLOOD LEFT HAND  Result Value Ref Range Status   Specimen Description BLOOD LEFT HAND  Final   Special Requests   Final    BOTTLES DRAWN AEROBIC AND ANAEROBIC Blood Culture adequate volume   Culture  Final    NO GROWTH 5 DAYS Performed at [facility], [place] [address], [place], [place] [address]    Report Status 08/20/2021 FINAL  Final  Culture, blood (Routine X 2) w Reflex to ID Panel     Status: None   Collection Time: 08/15/21  3:12 PM   Specimen: BLOOD  Result Value Ref Range Status   Specimen Description BLOOD RIGHT ANTECUBITAL  Final   Special Requests   Final    BOTTLES DRAWN AEROBIC AND ANAEROBIC Blood Culture adequate volume    Culture   Final    NO GROWTH 5 DAYS Performed at [facility], [place] [address], [place], [place] [address]    Report Status 08/20/2021 FINAL  Final  Culture, blood (Routine X 2) w Reflex to ID Panel     Status: None   Collection Time: 08/22/21  4:30 AM   Specimen: BLOOD LEFT HAND  Result Value Ref Range Status   Specimen Description BLOOD LEFT HAND  Final   Special Requests   Final    BOTTLES DRAWN AEROBIC AND ANAEROBIC Blood Culture results may not be optimal due to an excessive volume of blood received in culture bottles   Culture   Final    NO GROWTH 5 DAYS Performed at [facility], [place] [address], [place], [place] [address]    Report Status 08/27/2021 FINAL  Final  Culture, blood (Routine X 2) w Reflex to ID Panel     Status: Abnormal   Collection Time: 08/22/21  4:36 AM   Specimen: BLOOD RIGHT HAND  Result Value Ref Range Status   Specimen Description BLOOD RIGHT HAND  Final   Special Requests   Final    BOTTLES DRAWN AEROBIC AND ANAEROBIC Blood Culture results may not be optimal due to an excessive volume of blood received in culture bottles   Culture  Setup Time   Final    GRAM POSITIVE COCCI AEROBIC BOTTLE ONLY CRITICAL RESULT CALLED TO, READ BACK BY AND VERIFIED WITH: PHARMD JAMES LEDFORD 08/23/21_0 :15 BY TW    Culture (A)  Final    STAPHYLOCOCCUS CAPITIS THE SIGNIFICANCE OF ISOLATING THIS ORGANISM FROM A SINGLE SET OF BLOOD CULTURES WHEN MULTIPLE SETS ARE DRAWN IS UNCERTAIN. PLEASE NOTIFY THE MICROBIOLOGY DEPARTMENT WITHIN ONE WEEK IF SPECIATION AND SENSITIVITIES ARE REQUIRED. Performed at [facility], [place] [address], [place],  [address]    Report Status 08/25/2021 FINAL  Final  Blood Culture ID Panel (Reflexed)     Status: Abnormal   Collection Time: 08/22/21  4:36 AM  Result Value Ref Range Status   Enterococcus faecalis NOT DETECTED NOT DETECTED Final   Enterococcus Faecium NOT DETECTED NOT DETECTED Final   Listeria monocytogenes NOT  DETECTED NOT DETECTED Final   Staphylococcus species DETECTED (A) NOT DETECTED Final    Comment: CRITICAL RESULT CALLED TO, READ BACK BY AND VERIFIED WITH: PHARMD JAMES LEDFORD 08/23/21_1 :15 BY TW    Staphylococcus aureus (BCID) NOT DETECTED NOT DETECTED Final   Staphylococcus epidermidis NOT DETECTED NOT DETECTED Final   Staphylococcus lugdunensis NOT DETECTED NOT DETECTED Final   Streptococcus species NOT DETECTED NOT DETECTED Final   Streptococcus agalactiae NOT DETECTED NOT DETECTED Final   Streptococcus pneumoniae NOT DETECTED NOT DETECTED Final   Streptococcus pyogenes NOT DETECTED NOT DETECTED Final   A.calcoaceticus-baumannii NOT DETECTED NOT DETECTED Final   Bacteroides fragilis NOT DETECTED NOT DETECTED Final   Enterobacterales NOT DETECTED NOT DETECTED Final   Enterobacter  cloacae complex NOT DETECTED NOT DETECTED Final   Escherichia coli NOT DETECTED NOT DETECTED Final   Klebsiella aerogenes NOT DETECTED NOT DETECTED Final   Klebsiella oxytoca NOT DETECTED NOT DETECTED Final   Klebsiella pneumoniae NOT DETECTED NOT DETECTED Final   Proteus species NOT DETECTED NOT DETECTED Final   Salmonella species NOT DETECTED NOT DETECTED Final   Serratia marcescens NOT DETECTED NOT DETECTED Final   Haemophilus influenzae NOT DETECTED NOT DETECTED Final   Neisseria meningitidis NOT DETECTED NOT DETECTED Final   Pseudomonas aeruginosa NOT DETECTED NOT DETECTED Final   Stenotrophomonas maltophilia NOT DETECTED NOT DETECTED Final   Candida albicans NOT DETECTED NOT DETECTED Final   Candida auris NOT DETECTED NOT DETECTED Final   Candida glabrata NOT DETECTED NOT DETECTED Final   Candida krusei NOT DETECTED NOT DETECTED Final   Candida parapsilosis NOT DETECTED NOT DETECTED Final   Candida tropicalis NOT DETECTED NOT DETECTED Final   Cryptococcus neoformans/gattii NOT DETECTED NOT DETECTED Final    Comment: Performed at [facility], [place] [address] [place], [place] [address]   Culture, blood (Routine X 2) w Reflex to ID Panel     Status: None   Collection Time: 08/23/21  8:52 AM   Specimen: BLOOD LEFT HAND  Result Value Ref Range Status   Specimen Description BLOOD LEFT HAND  Final   Special Requests AEROBIC BOTTLE ONLY Blood Culture adequate volume  Final   Culture   Final    NO GROWTH 5 DAYS Performed at [facility], [place] [address], [place], [place] [address]    Report Status 08/28/2021 FINAL  Final  Culture, blood (Routine X 2) w Reflex to ID Panel     Status: Abnormal   Collection Time: 08/23/21  9:11 AM   Specimen: BLOOD LEFT HAND  Result Value Ref Range Status   Specimen Description BLOOD LEFT HAND  Final   Special Requests IN PEDIATRIC BOTTLE Blood Culture adequate volume  Final   Culture  Setup Time   Final    GRAM POSITIVE COCCI IN CLUSTERS IN PEDIATRIC BOTTLE CRITICAL RESULT CALLED TO, READ BACK BY AND VERIFIED WITH: PHARMD E MARTIN [id number] AT 44 AM BYCM    Culture (A)  Final    STAPHYLOCOCCUS EPIDERMIDIS THE SIGNIFICANCE OF ISOLATING THIS ORGANISM FROM A SINGLE SET OF BLOOD CULTURES WHEN MULTIPLE SETS ARE DRAWN IS UNCERTAIN. PLEASE NOTIFY THE MICROBIOLOGY DEPARTMENT WITHIN ONE WEEK IF SPECIATION AND SENSITIVITIES ARE REQUIRED. Performed at [facility], [place] [address] [place], [place] [address]    Report Status 08/25/2021 FINAL  Final    Labs: CBC: Recent Labs  Lab 10/23/21 0415 10/24/21 0431  WBC 7.0 7.5  NEUTROABS 4.1 4.8  HGB 12.3* 12.6*  HCT 36.9* 37.1*  MCV 94.4 93.5  PLT 227 423   Basic Metabolic Panel: Recent Labs  Lab 10/23/21 0415 10/24/21 0431  NA 134* 135  K 4.6 4.6  CL 97* 97*  CO2 30 29  GLUCOSE 119* 105*  BUN 30* 29*  CREATININE 0.69 0.75  CALCIUM 9.1 9.3  MG 1.9 1.9   Liver Function Tests: Recent Labs  Lab 10/23/21 0415 10/24/21 0431  AST 18 17  ALT 17 16  ALKPHOS 51 53  BILITOT 0.5 0.2*  PROT 6.1* 6.4*  ALBUMIN 2.4* 2.4*   CBG: Recent Labs  Lab 10/17/21 1123  GLUCAP 94     Discharge time spent: 36 minutes.   Signed: Hosie Poisson, MD [facility] 10/24/2021

## 2021-10-24 NOTE — Progress Notes (Signed)
1338- [facility] at bedside to transport patient home. AVS printed and placed in packed. Called and left a voicemail with wife to notify of patient arrival. Home health notified at patient departure. Discharging home with home health via [facility].

## 2021-10-24 NOTE — Progress Notes (Addendum)
Patient will discharge home via [facility] - pick up time is scheduled for 12pm. [facility] staff will be at the home waiting for patient to arrive.  RN aware that patient will need to be deep suctioned prior to discharge.  CSW spoke with Dr. Blake Divine who will finalize patient's discharge summary.  CSW spoke with Suzette Battiest at [facility] to inform her of patient's discharge plan - MD (PCP) will be notified.  Edwin Dada, MSW, LCSW Transitions of Care  Clinical Social Worker II [phone]

## 2021-10-26 ENCOUNTER — Ambulatory Visit: Payer: BC Managed Care – PPO | Admitting: Family Medicine

## 2021-10-26 MED ORDER — CLONAZEPAM 0.5 MG PO TABS: 0.5000 mg | ORAL_TABLET | Freq: Two times a day (BID) | ORAL | 0 refills | Status: DC

## 2021-10-26 MED ORDER — ISOSORBIDE DINITRATE 30 MG PO TABS: 30.0000 mg | ORAL_TABLET | Freq: Three times a day (TID) | ORAL | 0 refills | Status: DC

## 2021-10-26 MED ORDER — VALPROIC ACID 250 MG/5ML PO SOLN: 750.0000 mg | Freq: Three times a day (TID) | ORAL | 1 refills | Status: DC

## 2021-10-26 MED ORDER — GLYCOPYRROLATE 1 MG PO TABS: 1.0000 mg | ORAL_TABLET | Freq: Three times a day (TID) | ORAL | 0 refills | Status: DC

## 2021-10-26 NOTE — Progress Notes (Unsigned)
New Patient Office Visit  Subjective    Patient ID: Billy Cherry, male    DOB: Sep 27, 1970  Age: 51 y.o. MRN: [id number]  CC: No chief complaint on file.   HPI Billy Cherry presents to establish care ***  Outpatient Encounter Medications as of 10/26/2021  Medication Sig   acetaminophen (TYLENOL) 325 MG tablet Place 2 tablets (650 mg total) into feeding tube every 6 (six) hours as needed for mild pain or fever.   apixaban (ELIQUIS) 5 MG TABS tablet Place 1 tablet (5 mg total) into feeding tube 2 (two) times daily.   atropine 1 % ophthalmic solution Place 2 drops under the tongue 4 (four) times daily.   carvedilol (COREG) 25 MG tablet Place 1 tablet (25 mg total) into feeding tube 2 (two) times daily with a meal.   chlorhexidine gluconate, MEDLINE KIT, (PERIDEX) 0.12 % solution 15 mLs by Mouth Rinse route 2 (two) times daily.   clonazePAM (KLONOPIN) 0.5 MG tablet Place 1 tablet (0.5 mg total) into feeding tube 2 (two) times daily.   doxazosin (CARDURA) 2 MG tablet Place 1 tablet (2 mg total) into feeding tube daily. (Patient not taking: Reported on 10/05/2021)   famotidine (PEPCID) 20 MG tablet Place 1 tablet (20 mg total) into feeding tube 2 (two) times daily.   fiber (NUTRISOURCE FIBER) PACK packet Place 1 packet into feeding tube 2 (two) times daily.   glycopyrrolate (ROBINUL) 1 MG tablet Place 1 tablet (1 mg total) into feeding tube 3 (three) times daily.   hydrALAZINE (APRESOLINE) 50 MG tablet Place 1 tablet (50 mg total) into feeding tube 3 (three) times daily.   ipratropium-albuterol (DUONEB) 0.5-2.5 (3) MG/3ML SOLN Take 3 mLs by nebulization every 4 (four) hours as needed (wheezing, shortness of breath).   isosorbide dinitrate (ISORDIL) 30 MG tablet Place 1 tablet (30 mg total) into feeding tube 3 (three) times daily.   levETIRAcetam (KEPPRA) 100 MG/ML solution Place 7.5 mLs (750 mg total) into feeding tube 2 (two) times daily.   Loperamide HCl 1 MG/7.5ML LIQD Place 15 mLs (2  mg total) into feeding tube daily as needed for diarrhea or loose stools.   Mouthwashes (MOUTH RINSE) LIQD solution 15 mLs by Mouth Rinse route every 4 (four) hours.   Nutritional Supplements (FEEDING SUPPLEMENT, OSMOLITE 1.5 CAL,) LIQD Place 1,000 mLs into feeding tube continuous.   Nutritional Supplements (FEEDING SUPPLEMENT, PROSOURCE TF,) liquid Place 45 mLs into feeding tube 3 (three) times daily. (Patient taking differently: Place 45 mLs into feeding tube 2 (two) times daily.)   Nystatin (GERHARDT'S BUTT CREAM) CREA Apply 1 Application topically daily.   polyethylene glycol powder (GLYCOLAX/MIRALAX) 17 GM/SCOOP powder Place 1 capful  (17 g) into feeding tube daily as needed for mild constipation or moderate constipation.   scopolamine (TRANSDERM-SCOP) 1 MG/3DAYS Place 1 patch (1.5 mg total) onto the skin every 3 (three) days.   spironolactone (ALDACTONE) 25 MG tablet Place 1 tablet (25 mg total) into feeding tube daily.   valproic acid (DEPAKENE) 250 MG/5ML solution Place 15 mLs (750 mg total) into feeding tube 3 (three) times daily.   Water For Irrigation, Sterile (FREE WATER) SOLN Place 200 mLs into feeding tube every 6 (six) hours.   No facility-administered encounter medications on file as of 10/26/2021.    Past Medical History:  Diagnosis Date   Hypertension     Past Surgical History:  Procedure Laterality Date   BACK SURGERY     CORONARY/GRAFT ACUTE MI REVASCULARIZATION N/A 03/23/2021  Procedure: Coronary/Graft Acute MI Revascularization;  Surgeon: Isaias Cowman, MD;  Location: [facility];  Service: Cardiovascular;  Laterality: N/A;   IR GASTROSTOMY TUBE MOD SED  04/26/2021   LEFT HEART CATH AND CORONARY ANGIOGRAPHY N/A 03/23/2021   Procedure: LEFT HEART CATH AND CORONARY ANGIOGRAPHY;  Surgeon: Isaias Cowman, MD;  Location: [facility];  Service: Cardiovascular;  Laterality: N/A;   SHOULDER SURGERY      No family history on file.  Social  History   Socioeconomic History   Marital status: Married    Spouse name: Not on file   Number of children: Not on file   Years of education: Not on file   Highest education level: Not on file  Occupational History   Not on file  Tobacco Use   Smoking status: Never   Smokeless tobacco: Never  Vaping Use   Vaping Use: Unknown  Substance and Sexual Activity   Alcohol use: Yes   Drug use: Never   Sexual activity: Never  Other Topics Concern   Not on file  Social History Narrative   Not on file   Social Determinants of Health   Financial Resource Strain: Not on file  Food Insecurity: Not on file  Transportation Needs: Not on file  Physical Activity: Not on file  Stress: Not on file  Social Connections: Not on file  Intimate Partner Violence: Not on file    ROS      Objective    There were no vitals taken for this visit.  Physical Exam  {Labs (Optional):23779}    Assessment & Plan:   Problem List Items Addressed This Visit   None   No follow-ups on file.   Elwin Mocha, MD

## 2021-10-27 ENCOUNTER — Other Ambulatory Visit: Payer: BC Managed Care – PPO

## 2021-10-30 ENCOUNTER — Other Ambulatory Visit (HOSPITAL_COMMUNITY): Payer: Self-pay

## 2021-11-07 ENCOUNTER — Other Ambulatory Visit (HOSPITAL_COMMUNITY): Payer: Self-pay

## 2021-11-07 ENCOUNTER — Other Ambulatory Visit: Payer: Self-pay | Admitting: Family Medicine

## 2021-11-07 DIAGNOSIS — G931 Anoxic brain damage, not elsewhere classified: Secondary | ICD-10-CM

## 2021-11-07 MED ORDER — DOXAZOSIN MESYLATE 2 MG PO TABS: 2.0000 mg | ORAL_TABLET | Freq: Every day | ORAL | 3 refills | Status: DC

## 2021-11-07 MED ORDER — ATROPINE SULFATE 1 % OP SOLN: 2.0000 [drp] | Freq: Four times a day (QID) | OPHTHALMIC | 3 refills | Status: DC

## 2021-11-07 MED ORDER — SPIRONOLACTONE 25 MG PO TABS: 25.0000 mg | ORAL_TABLET | Freq: Every day | ORAL | 3 refills | Status: DC

## 2021-11-07 NOTE — Telephone Encounter (Signed)
Med refills req and meds sent.  Called home twice and no answer.  Will schedule a call.Billy Salter, MD

## 2021-11-08 ENCOUNTER — Ambulatory Visit (INDEPENDENT_AMBULATORY_CARE_PROVIDER_SITE_OTHER): Payer: BC Managed Care – PPO | Admitting: Family Medicine

## 2021-11-08 VITALS — BP 128/79 | HR 78 | Temp 98.6°F | Resp 10

## 2021-11-08 DIAGNOSIS — G931 Anoxic brain damage, not elsewhere classified: Secondary | ICD-10-CM

## 2021-11-08 DIAGNOSIS — I10 Essential (primary) hypertension: Secondary | ICD-10-CM

## 2021-11-08 DIAGNOSIS — Z930 Tracheostomy status: Secondary | ICD-10-CM

## 2021-11-08 DIAGNOSIS — G40909 Epilepsy, unspecified, not intractable, without status epilepticus: Secondary | ICD-10-CM

## 2021-11-08 DIAGNOSIS — K219 Gastro-esophageal reflux disease without esophagitis: Secondary | ICD-10-CM

## 2021-11-08 DIAGNOSIS — J9611 Chronic respiratory failure with hypoxia: Secondary | ICD-10-CM

## 2021-11-08 NOTE — Progress Notes (Signed)
Established Patient Office Visit  Subjective   Patient ID: Billy Cherry, male    DOB: Aug 06, 1970  Age: 51 y.o. MRN: [id number]  Chief Complaint  Patient presents with   Medical Management of Chronic Issues    CC: medication management  51 year old male with past medical history significant for anoxic brain injury, trach dependency, PEG tube, seizures getting evaluated for multiple issues.  Patient is trach dependent no issues with the trach per nursing cleaning has been regular with no issues.  Patient does not have occasional coughing.  Suctioning is at a minimum if needed at all.  No seizure activity reported.  Patient has been taking Keppra as prescribed.  Patient will need upcoming Keppra level as 1 has not been taken recently.  Patient had no history of seizures prior to this.  Patient has had normal appearing sleep-wake cycles.  Patient does not appears sedated or lethargic from seizure medication.  Patient has been tolerating tube feeds well.  Less than 2 reported episodes over the last several days of any vomiting.  Stools have been loose but not voluminous diarrhea.  No blood in stools.  Bowel movements are at regular intervals.  Nursing reports that residuals have been normal.  Patient has been taking the antihypertensive.  Blood pressure appears to be controlled per nursing report.  No difficulty with administering the medication to patient's PEG tube.  Compliance is excellent.  Patient with no appearance of reflux type symptoms.  Symptoms well controlled patient's reflux medication.  Little grimacing with palpation of epigastric area after boluses.  Medication does not seem to sedate patient.  Patient could chronically on trach.  Patient is also on supplemental oxygen.  Off of the device patient's oxygen does drop to 87 to 89%.  Patient works well with the machine without difficulty.         Review of Systems  Unable to perform ROS: Medical condition      Objective:      BP 128/79   Pulse 78   Temp 98.6 F (37 C)   Resp 10   SpO2 96%    Physical Exam Vitals and nursing note reviewed.  Constitutional:      General: He is not in acute distress.    Appearance: Normal appearance. He is normal weight. He is not ill-appearing.  HENT:     Head: Normocephalic and atraumatic.     Right Ear: External ear normal.     Left Ear: External ear normal.     Mouth/Throat:     Mouth: Mucous membranes are moist.     Comments: Trach in place Eyes:     General: No scleral icterus.       Right eye: No discharge.        Left eye: No discharge.     Conjunctiva/sclera: Conjunctivae normal.  Cardiovascular:     Rate and Rhythm: Normal rate and regular rhythm.     Pulses: Normal pulses.     Heart sounds: Normal heart sounds.  Pulmonary:     Effort: Pulmonary effort is normal.     Breath sounds: Normal breath sounds.  Abdominal:     General: Abdomen is flat. Bowel sounds are normal. There is no distension.     Palpations: Abdomen is soft.     Tenderness: There is no abdominal tenderness.  Skin:    Capillary Refill: Capillary refill takes less than 2 seconds.  Neurological:     Mental Status: He is alert.  Comments: Pt at baseline  Psychiatric:     Comments: Trach, non verbal      No results found for any visits on 11/08/21.    The 10-year ASCVD risk score (Arnett DK, et al., 2019) is: 8.5%    Assessment & Plan:   Problem List Items Addressed This Visit       Cardiovascular and Mediastinum   HTN (hypertension) (Chronic)     Respiratory   Chronic respiratory failure with hypoxia ([name])     Nervous and Auditory   Anoxic brain injury ([name]) - Primary   Other Visit Diagnoses     Tracheostomy dependence ([name])       Seizure disorder ([name])       Gastroesophageal reflux disease without esophagitis          Hypertension, chronic and well controlled - Will double check nurse at bedside list of hypertensives against discharge medication and  refill medication for 44-month interval  Chronic respiratory failure with hypoxia - Will continue oxygen therapy, additionally patient needs nebulizer, will prescribe 1 along with patient's DuoNebs and some albuterol  Anoxic brain injury, chronic - Outlook is not good, after patient is settled will initiate palliative consult  Trach dependency - We will change trach periodically with nursing staff, tracheostomy supplies ordered  Seizures, chronic, well controlled - We will check Keppra level in the near future along with other labs, continue Keppra for now, if recurrent issue will consider prescribing diazepam  Reflux - Well-controlled continue Pepcid   Return in about 1 month (around 12/08/2021).    Haydee Salter, MD

## 2021-11-10 ENCOUNTER — Other Ambulatory Visit: Payer: Self-pay | Admitting: Family Medicine

## 2021-11-11 ENCOUNTER — Other Ambulatory Visit: Payer: Self-pay | Admitting: Family Medicine

## 2021-11-11 DIAGNOSIS — I5022 Chronic systolic (congestive) heart failure: Secondary | ICD-10-CM

## 2021-11-11 DIAGNOSIS — G931 Anoxic brain damage, not elsewhere classified: Secondary | ICD-10-CM

## 2021-11-11 DIAGNOSIS — G40909 Epilepsy, unspecified, not intractable, without status epilepticus: Secondary | ICD-10-CM

## 2021-11-11 DIAGNOSIS — J9611 Chronic respiratory failure with hypoxia: Secondary | ICD-10-CM

## 2021-11-11 LAB — COMPREHENSIVE METABOLIC PANEL
ALT: 12 IU/L (ref 0–44)
AST: 15 IU/L (ref 0–40)
Albumin/Globulin Ratio: 0.9 — ABNORMAL LOW (ref 1.2–2.2)
Albumin: 3.3 g/dL — ABNORMAL LOW (ref 4.1–5.1)
Alkaline Phosphatase: 76 IU/L (ref 44–121)
BUN/Creatinine Ratio: 45 — ABNORMAL HIGH (ref 9–20)
BUN: 38 mg/dL — ABNORMAL HIGH (ref 6–24)
Bilirubin Total: 0.3 mg/dL (ref 0.0–1.2)
CO2: 28 mmol/L (ref 20–29)
Calcium: 9.4 mg/dL (ref 8.7–10.2)
Chloride: 94 mmol/L — ABNORMAL LOW (ref 96–106)
Creatinine, Ser: 0.85 mg/dL (ref 0.76–1.27)
Globulin, Total: 3.6 g/dL (ref 1.5–4.5)
Glucose: 129 mg/dL — ABNORMAL HIGH (ref 70–99)
Potassium: 4.7 mmol/L (ref 3.5–5.2)
Sodium: 136 mmol/L (ref 134–144)
Total Protein: 6.9 g/dL (ref 6.0–8.5)
eGFR: 106 mL/min/{1.73_m2} (ref >59)

## 2021-11-11 LAB — LIPID PANEL W/O CHOL/HDL RATIO
Cholesterol, Total: 115 mg/dL (ref 100–199)
HDL: 26 mg/dL — ABNORMAL LOW (ref >39)
LDL Chol Calc (NIH): 55 mg/dL (ref 0–99)
Triglycerides: 208 mg/dL — ABNORMAL HIGH (ref 0–149)
VLDL Cholesterol Cal: 34 mg/dL (ref 5–40)

## 2021-11-11 LAB — CBC WITH DIFFERENTIAL/PLATELET
Basophils Absolute: 0 10*3/uL (ref 0.0–0.2)
Basos: 0 %
EOS (ABSOLUTE): 0.1 10*3/uL (ref 0.0–0.4)
Eos: 2 %
Hematocrit: 40.4 % (ref 37.5–51.0)
Hemoglobin: 13.3 g/dL (ref 13.0–17.7)
Immature Grans (Abs): 0 10*3/uL (ref 0.0–0.1)
Immature Granulocytes: 0 %
Lymphocytes Absolute: 1.6 10*3/uL (ref 0.7–3.1)
Lymphs: 21 %
MCH: 31.4 pg (ref 26.6–33.0)
MCHC: 32.9 g/dL (ref 31.5–35.7)
MCV: 96 fL (ref 79–97)
Monocytes Absolute: 1.2 10*3/uL — ABNORMAL HIGH (ref 0.1–0.9)
Monocytes: 16 %
Neutrophils Absolute: 4.4 10*3/uL (ref 1.4–7.0)
Neutrophils: 61 %
Platelets: 183 10*3/uL (ref 150–450)
RBC: 4.23 x10E6/uL (ref 4.14–5.80)
RDW: 13.2 % (ref 11.6–15.4)
WBC: 7.3 10*3/uL (ref 3.4–10.8)

## 2021-11-11 LAB — PHOSPHORUS: Phosphorus: 4.1 mg/dL (ref 2.8–4.1)

## 2021-11-11 LAB — HGB A1C W/O EAG: Hgb A1c MFr Bld: 5.4 % (ref 4.8–5.6)

## 2021-11-11 LAB — MAGNESIUM: Magnesium: 2.1 mg/dL (ref 1.6–2.3)

## 2021-11-11 MED ORDER — NEBULIZER SYSTEM ALL-IN-ONE MISC: 1.0000 | 0 refills | Status: DC | PRN

## 2021-11-11 MED ORDER — ALBUTEROL SULFATE (2.5 MG/3ML) 0.083% IN NEBU: 2.5000 mg | INHALATION_SOLUTION | Freq: Four times a day (QID) | RESPIRATORY_TRACT | 1 refills | Status: DC | PRN

## 2021-11-11 NOTE — Telephone Encounter (Signed)
Nebulizer req to go to pharmacy. Not DME company as prev discussed. This was changed.   Haydee Salter, MD

## 2021-11-13 ENCOUNTER — Other Ambulatory Visit (HOSPITAL_COMMUNITY): Payer: Self-pay

## 2021-11-13 MED ORDER — IPRATROPIUM-ALBUTEROL 0.5-2.5 (3) MG/3ML IN SOLN: 3.0000 mL | RESPIRATORY_TRACT | 0 refills | Status: DC | PRN

## 2021-11-13 NOTE — Addendum Note (Signed)
Addended byHaydee Salter on: 11/13/2021 03:14 PM   Modules accepted: Orders

## 2021-11-14 MED ORDER — IPRATROPIUM-ALBUTEROL 0.5-2.5 (3) MG/3ML IN SOLN
3.0000 mL | RESPIRATORY_TRACT | 0 refills | Status: DC | PRN
Start: 2021-11-14 — End: 2021-12-15

## 2021-11-14 MED ORDER — POLYETHYLENE GLYCOL 3350 17 GM/SCOOP PO POWD: 17.0000 g | Freq: Every day | ORAL | 0 refills | Status: DC | PRN

## 2021-11-14 MED ORDER — SPIRONOLACTONE 25 MG PO TABS: 25.0000 mg | ORAL_TABLET | Freq: Every day | ORAL | 0 refills | Status: DC

## 2021-11-14 MED ORDER — CARVEDILOL 25 MG PO TABS: 25.0000 mg | ORAL_TABLET | Freq: Two times a day (BID) | ORAL | 1 refills | Status: DC

## 2021-11-14 MED ORDER — ACETAMINOPHEN 325 MG PO TABS
650.0000 mg | ORAL_TABLET | Freq: Four times a day (QID) | ORAL | 1 refills | Status: AC | PRN
Start: 2021-11-14 — End: 2022-02-12

## 2021-11-14 MED ORDER — DOXAZOSIN MESYLATE 2 MG PO TABS: 2.0000 mg | ORAL_TABLET | Freq: Every day | ORAL | 0 refills | Status: DC

## 2021-11-14 MED ORDER — GLYCOPYRROLATE 1 MG PO TABS: 1.0000 mg | ORAL_TABLET | Freq: Three times a day (TID) | ORAL | 1 refills | Status: AC

## 2021-11-14 MED ORDER — LOPERAMIDE HCL 1 MG/7.5ML PO LIQD
2.0000 mg | Freq: Every day | ORAL | 1 refills | Status: DC | PRN
Start: 2021-11-14 — End: 2023-05-02

## 2021-11-14 MED ORDER — FAMOTIDINE 20 MG PO TABS: 20.0000 mg | ORAL_TABLET | Freq: Two times a day (BID) | ORAL | 0 refills | Status: DC

## 2021-11-14 MED ORDER — CLONAZEPAM 0.5 MG PO TABS: 0.5000 mg | ORAL_TABLET | Freq: Two times a day (BID) | ORAL | 0 refills | Status: DC

## 2021-11-14 MED ORDER — APIXABAN 5 MG PO TABS: 5.0000 mg | ORAL_TABLET | Freq: Two times a day (BID) | ORAL | 1 refills | Status: DC

## 2021-11-14 MED ORDER — LEVETIRACETAM 100 MG/ML PO SOLN
750.0000 mg | Freq: Two times a day (BID) | ORAL | 0 refills | Status: DC
Start: 2021-11-14 — End: 2021-12-15

## 2021-11-14 MED ORDER — ISOSORBIDE DINITRATE 30 MG PO TABS: 30.0000 mg | ORAL_TABLET | Freq: Three times a day (TID) | ORAL | 0 refills | Status: DC

## 2021-11-14 MED ORDER — SCOPOLAMINE 1 MG/3DAYS TD PT72: 1.0000 | MEDICATED_PATCH | TRANSDERMAL | 0 refills | Status: DC

## 2021-11-14 MED ORDER — HYDRALAZINE HCL 50 MG PO TABS: 50.0000 mg | ORAL_TABLET | Freq: Three times a day (TID) | ORAL | 1 refills | Status: DC

## 2021-11-14 MED ORDER — VALPROIC ACID 250 MG/5ML PO SOLN: 750.0000 mg | Freq: Three times a day (TID) | ORAL | 5 refills | Status: DC

## 2021-11-14 NOTE — Addendum Note (Signed)
Addended byHaydee Salter on: 11/14/2021 06:03 PM   Modules accepted: Orders

## 2021-11-14 NOTE — Telephone Encounter (Signed)
Call to patients wife. We discussed a number of things including but not limited to DME equipment orders, hospital discharge orders, medication refills, meds sent to the pharmacy.  Contacted DME company. They will reach back out to the office.  PM called nursing who will call DME.  Spouse who cares about her husband.  Wife will contact pharmacy about nebulizer.  Haydee Salter, MD

## 2021-11-24 ENCOUNTER — Encounter: Payer: Self-pay | Admitting: Family Medicine

## 2021-11-29 ENCOUNTER — Other Ambulatory Visit: Payer: Self-pay | Admitting: Family Medicine

## 2021-12-04 ENCOUNTER — Other Ambulatory Visit: Payer: Self-pay | Admitting: Family Medicine

## 2021-12-20 ENCOUNTER — Ambulatory Visit: Payer: BC Managed Care – PPO | Admitting: Family Medicine

## 2021-12-22 ENCOUNTER — Other Ambulatory Visit: Payer: Self-pay | Admitting: Family Medicine

## 2021-12-22 DIAGNOSIS — I5022 Chronic systolic (congestive) heart failure: Secondary | ICD-10-CM

## 2021-12-22 DIAGNOSIS — G40909 Epilepsy, unspecified, not intractable, without status epilepticus: Secondary | ICD-10-CM

## 2021-12-26 ENCOUNTER — Ambulatory Visit: Payer: BC Managed Care – PPO | Admitting: Family Medicine

## 2021-12-26 DIAGNOSIS — Z930 Tracheostomy status: Secondary | ICD-10-CM

## 2021-12-26 DIAGNOSIS — Z09 Encounter for follow-up examination after completed treatment for conditions other than malignant neoplasm: Secondary | ICD-10-CM

## 2021-12-26 DIAGNOSIS — Z978 Presence of other specified devices: Secondary | ICD-10-CM

## 2021-12-26 DIAGNOSIS — G40909 Epilepsy, unspecified, not intractable, without status epilepticus: Secondary | ICD-10-CM

## 2021-12-27 MED ORDER — PROSOURCE TF PO LIQD: 45.0000 mL | Freq: Three times a day (TID) | ORAL | 1 refills | Status: AC

## 2021-12-27 MED ORDER — ZINC OXIDE 25 % EX OINT: 1.0000 | TOPICAL_OINTMENT | Freq: Two times a day (BID) | CUTANEOUS | 3 refills | Status: DC

## 2021-12-29 ENCOUNTER — Other Ambulatory Visit: Payer: Self-pay | Admitting: Family Medicine

## 2021-12-29 MED ORDER — FLUTICASONE PROPIONATE 50 MCG/ACT NA SUSP: 2.0000 | Freq: Every day | NASAL | 6 refills | Status: DC

## 2021-12-29 NOTE — Telephone Encounter (Signed)
Wife req pharmacy refills. Sent. Also sent in flonase per wife req.  She states pt's trach changed and he is doing well.  Elwin Mocha, MD

## 2022-01-05 ENCOUNTER — Inpatient Hospital Stay
Admission: EM | Admit: 2022-01-05 | Discharge: 2022-01-23 | DRG: 871 | Disposition: A | Discharge status: Home Health | Payer: BC Managed Care – PPO | Attending: Internal Medicine | Admitting: Internal Medicine

## 2022-01-05 ENCOUNTER — Emergency Department: Payer: BC Managed Care – PPO

## 2022-01-05 ENCOUNTER — Other Ambulatory Visit: Payer: Self-pay

## 2022-01-05 DIAGNOSIS — B965 Pseudomonas (aeruginosa) (mallei) (pseudomallei) as the cause of diseases classified elsewhere: Secondary | ICD-10-CM | POA: Diagnosis present

## 2022-01-05 DIAGNOSIS — Z888 Allergy status to other drugs, medicaments and biological substances status: Secondary | ICD-10-CM

## 2022-01-05 DIAGNOSIS — I5022 Chronic systolic (congestive) heart failure: Secondary | ICD-10-CM | POA: Diagnosis present

## 2022-01-05 DIAGNOSIS — Z79899 Other long term (current) drug therapy: Secondary | ICD-10-CM

## 2022-01-05 DIAGNOSIS — I10 Essential (primary) hypertension: Secondary | ICD-10-CM | POA: Diagnosis present

## 2022-01-05 DIAGNOSIS — J9621 Acute and chronic respiratory failure with hypoxia: Secondary | ICD-10-CM | POA: Diagnosis present

## 2022-01-05 DIAGNOSIS — E669 Obesity, unspecified: Secondary | ICD-10-CM | POA: Diagnosis present

## 2022-01-05 DIAGNOSIS — Z86711 Personal history of pulmonary embolism: Secondary | ICD-10-CM

## 2022-01-05 DIAGNOSIS — K529 Noninfective gastroenteritis and colitis, unspecified: Secondary | ICD-10-CM | POA: Diagnosis present

## 2022-01-05 DIAGNOSIS — G931 Anoxic brain damage, not elsewhere classified: Secondary | ICD-10-CM | POA: Diagnosis present

## 2022-01-05 DIAGNOSIS — N179 Acute kidney failure, unspecified: Secondary | ICD-10-CM | POA: Diagnosis present

## 2022-01-05 DIAGNOSIS — I5023 Acute on chronic systolic (congestive) heart failure: Secondary | ICD-10-CM | POA: Diagnosis present

## 2022-01-05 DIAGNOSIS — Z930 Tracheostomy status: Secondary | ICD-10-CM

## 2022-01-05 DIAGNOSIS — I110 Hypertensive heart disease with heart failure: Secondary | ICD-10-CM | POA: Diagnosis present

## 2022-01-05 DIAGNOSIS — D649 Anemia, unspecified: Secondary | ICD-10-CM | POA: Diagnosis present

## 2022-01-05 DIAGNOSIS — Z1152 Encounter for screening for COVID-19: Secondary | ICD-10-CM

## 2022-01-05 DIAGNOSIS — A419 Sepsis, unspecified organism: Secondary | ICD-10-CM | POA: Diagnosis not present

## 2022-01-05 DIAGNOSIS — Z7901 Long term (current) use of anticoagulants: Secondary | ICD-10-CM

## 2022-01-05 DIAGNOSIS — E875 Hyperkalemia: Secondary | ICD-10-CM | POA: Diagnosis present

## 2022-01-05 DIAGNOSIS — I82409 Acute embolism and thrombosis of unspecified deep veins of unspecified lower extremity: Secondary | ICD-10-CM | POA: Diagnosis present

## 2022-01-05 DIAGNOSIS — E8809 Other disorders of plasma-protein metabolism, not elsewhere classified: Secondary | ICD-10-CM | POA: Diagnosis present

## 2022-01-05 DIAGNOSIS — Z931 Gastrostomy status: Secondary | ICD-10-CM

## 2022-01-05 DIAGNOSIS — R6520 Severe sepsis without septic shock: Secondary | ICD-10-CM | POA: Diagnosis present

## 2022-01-05 DIAGNOSIS — J0410 Acute tracheitis without obstruction: Secondary | ICD-10-CM | POA: Diagnosis present

## 2022-01-05 DIAGNOSIS — J690 Pneumonitis due to inhalation of food and vomit: Secondary | ICD-10-CM | POA: Diagnosis present

## 2022-01-05 DIAGNOSIS — Z8674 Personal history of sudden cardiac arrest: Secondary | ICD-10-CM

## 2022-01-05 DIAGNOSIS — Z86718 Personal history of other venous thrombosis and embolism: Secondary | ICD-10-CM

## 2022-01-05 DIAGNOSIS — Z6832 Body mass index (BMI) 32.0-32.9, adult: Secondary | ICD-10-CM

## 2022-01-05 LAB — COMPREHENSIVE METABOLIC PANEL
ALT: 18 U/L (ref 0–44)
AST: 31 U/L (ref 15–41)
Albumin: 3 g/dL — ABNORMAL LOW (ref 3.5–5.0)
Alkaline Phosphatase: 58 U/L (ref 38–126)
Anion gap: 8 (ref 5–15)
BUN: 44 mg/dL — ABNORMAL HIGH (ref 6–20)
CO2: 29 mmol/L (ref 22–32)
Calcium: 9.3 mg/dL (ref 8.9–10.3)
Chloride: 100 mmol/L (ref 98–111)
Creatinine, Ser: 0.96 mg/dL (ref 0.61–1.24)
GFR, Estimated: >60 mL/min (ref >60)
Glucose, Bld: 124 mg/dL — ABNORMAL HIGH (ref 70–99)
Potassium: 4.9 mmol/L (ref 3.5–5.1)
Sodium: 137 mmol/L (ref 135–145)
Total Bilirubin: 0.7 mg/dL (ref 0.3–1.2)
Total Protein: 7.2 g/dL (ref 6.5–8.1)

## 2022-01-05 LAB — URINALYSIS, COMPLETE (UACMP) WITH MICROSCOPIC
Bacteria, UA: NONE SEEN
Bilirubin Urine: NEGATIVE
Glucose, UA: NEGATIVE mg/dL
Hgb urine dipstick: NEGATIVE
Ketones, ur: NEGATIVE mg/dL
Leukocytes,Ua: NEGATIVE
Nitrite: NEGATIVE
Protein, ur: NEGATIVE mg/dL
Specific Gravity, Urine: 1.018 (ref 1.005–1.030)
WBC, UA: NONE SEEN WBC/hpf (ref 0–5)
pH: 8 (ref 5.0–8.0)

## 2022-01-05 LAB — PROTIME-INR
INR: 1.2 (ref 0.8–1.2)
Prothrombin Time: 15.2 seconds (ref 11.4–15.2)

## 2022-01-05 LAB — CBC WITH DIFFERENTIAL/PLATELET
Abs Immature Granulocytes: 0.07 10*3/uL (ref 0.00–0.07)
Basophils Absolute: 0 10*3/uL (ref 0.0–0.1)
Basophils Relative: 0 %
Eosinophils Absolute: 0.1 10*3/uL (ref 0.0–0.5)
Eosinophils Relative: 0 %
HCT: 38.5 % — ABNORMAL LOW (ref 39.0–52.0)
Hemoglobin: 12.2 g/dL — ABNORMAL LOW (ref 13.0–17.0)
Immature Granulocytes: 0 %
Lymphocytes Relative: 7 %
Lymphs Abs: 1.1 10*3/uL (ref 0.7–4.0)
MCH: 31.4 pg (ref 26.0–34.0)
MCHC: 31.7 g/dL (ref 30.0–36.0)
MCV: 99.2 fL (ref 80.0–100.0)
Monocytes Absolute: 2.3 10*3/uL — ABNORMAL HIGH (ref 0.1–1.0)
Monocytes Relative: 14 %
Neutro Abs: 13.4 10*3/uL — ABNORMAL HIGH (ref 1.7–7.7)
Neutrophils Relative %: 79 %
Platelets: 158 10*3/uL (ref 150–400)
RBC: 3.88 MIL/uL — ABNORMAL LOW (ref 4.22–5.81)
RDW: 14.1 % (ref 11.5–15.5)
WBC: 17 10*3/uL — ABNORMAL HIGH (ref 4.0–10.5)
nRBC: 0 % (ref 0.0–0.2)

## 2022-01-05 LAB — LACTIC ACID, PLASMA: Lactic Acid, Venous: 2.5 mmol/L (ref 0.5–1.9)

## 2022-01-05 LAB — APTT: aPTT: 35 seconds (ref 24–36)

## 2022-01-05 MED ORDER — VANCOMYCIN HCL 1500 MG/300ML IV SOLN
1500.0000 mg | Freq: Once | INTRAVENOUS | Status: AC
  Administered 2022-01-05: 1500 mg via INTRAVENOUS
  Filled 2022-01-05: qty 300

## 2022-01-05 MED ORDER — LACTATED RINGERS IV SOLN: INTRAVENOUS | Status: DC

## 2022-01-05 MED ORDER — SODIUM CHLORIDE 0.9 % IV SOLN
2.0000 g | Freq: Once | INTRAVENOUS | Status: AC
  Administered 2022-01-05: 2 g via INTRAVENOUS
  Filled 2022-01-05: qty 12.5

## 2022-01-05 MED ORDER — SODIUM CHLORIDE 0.9 % IV SOLN
500.0000 mg | Freq: Once | INTRAVENOUS | Status: AC
  Administered 2022-01-05: 500 mg via INTRAVENOUS
  Filled 2022-01-05: qty 5

## 2022-01-05 MED ORDER — VANCOMYCIN HCL IN DEXTROSE 1-5 GM/200ML-% IV SOLN: 1000.0000 mg | Freq: Once | INTRAVENOUS | Status: DC

## 2022-01-05 MED ORDER — SODIUM CHLORIDE 0.9 % IV BOLUS
1000.0000 mL | Freq: Once | INTRAVENOUS | Status: AC
  Administered 2022-01-06: 1000 mL via INTRAVENOUS

## 2022-01-05 NOTE — ED Notes (Signed)
EDP Robinson to bedside.  

## 2022-01-05 NOTE — ED Notes (Signed)
Pt clammy/slightly diaphoretic.

## 2022-01-05 NOTE — Progress Notes (Signed)
CODE SEPSIS - PHARMACY COMMUNICATION  **Broad Spectrum Antibiotics should be administered within 1 hour of Sepsis diagnosis**  Time Code Sepsis Called/Page Received: 2231  Antibiotics Ordered: Cefepime & Vancomycin  Time of 1st antibiotic administration: [place], PharmD, [facility] 01/05/2022 10:34 PM

## 2022-01-05 NOTE — Sepsis Progress Note (Signed)
Elink monitoring for the code sepsis protocol.  

## 2022-01-05 NOTE — ED Notes (Signed)
Blood culture obtained by AH, RN at L wrist after placing IV.

## 2022-01-05 NOTE — ED Triage Notes (Signed)
Pt in via EMS from home due to [place] in 120s; suctioned by EMS staff as thick mucous clogging up trach initially; possible sepsis; temp 102.8; 92% on home 5L and 97% once switched to 8L by EMS; BG 134; 22L hand IV by EMS; given 350cc fluid by EMS; history of seizures; G-tube noted; recent trach change per EMS staff; 136/90 BP with EMS; family not helpful to EMS with understanding if this is pt's baseline neuro status. Pt not tracking; stares into space.

## 2022-01-05 NOTE — ED Notes (Signed)
AH, RN attempted for 2nd IV and cultures at R fa.

## 2022-01-05 NOTE — Progress Notes (Signed)
PHARMACY -  BRIEF ANTIBIOTIC NOTE   Pharmacy has received consult(s) for Cefepime & Vancomycin from an ED provider.  The patient's profile has been reviewed for ht/wt/allergies/indication/available labs.    One time order(s) placed for Cefepime 2 gm & Vancomycin based on wt of 94.2 kg from August 2023.  Further antibiotics/pharmacy consults should be ordered by admitting physician if indicated.                       Thank you, Renda Rolls, PharmD, [facility] 01/05/2022 10:35 PM

## 2022-01-05 NOTE — ED Provider Notes (Signed)
[facility] Provider Note    Event Date/Time   First MD Initiated Contact with Patient 01/05/22 2223     (approximate)   History   Sepsis  Level V Caveat:  AMS sepsis  HPI  Billy Cherry is a 51 y.o. male with extensive past medical history including anoxic brain injury history of cardiac arrest, hospital-acquired pneumonia, DVT PE trach dependent presents to the ER via EMS as a code sepsis.  Reportedly having altered mental status increasing secretions from his trach tachypnea.  Family did not provide much additional history about change in mental status and timing.  Reportedly did have trach exchanged.  No hemoptysis.     Physical Exam   Triage Vital Signs: ED Triage Vitals  Enc Vitals Group     BP --      Pulse --      Resp 01/05/22 2226 (!) 29     Temp 01/05/22 2228 (!) 101.5 F (38.6 C)     Temp Source 01/05/22 2228 Axillary     SpO2 01/05/22 2226 90 %     Weight 01/05/22 2232 240 lb (108.9 kg)     Height 01/05/22 2232 6' (1.829 m)     Head Circumference --      Peak Flow --      Pain Score --      Pain Loc --      Pain Edu? --      Excl. in [place]? --     Most recent vital signs: Vitals:   01/05/22 2303 01/05/22 2304  BP:  118/82  Pulse: (!) 105   Resp: (!) 27 (!) 23  Temp:    SpO2: 93%      Constitutional: Chronically ill-appearing Eyes: Conjunctivae are normal.  Head: Atraumatic. Nose: No congestion/rhinnorhea. Mouth/Throat: Mucous membranes are moist.   Neck: Painless ROM.  Cardiovascular:   Good peripheral circulation. Respiratory: Tachypnea with phlegm coming from tracheostomy tube. Gastrointestinal: Soft and nontender.  PEG-tube in place Musculoskeletal:  no deformity Neurologic:  unresponsive to verbal stimuli Skin:  Skin is warm, dry and intact. No rash noted.     ED Results / Procedures / Treatments   Labs (all labs ordered are listed, but only abnormal results are displayed) Labs Reviewed  COMPREHENSIVE  METABOLIC PANEL - Abnormal; Notable for the following components:      Result Value   Glucose, Bld 124 (*)    BUN 44 (*)    Albumin 3.0 (*)    All other components within normal limits  CBC WITH DIFFERENTIAL/PLATELET - Abnormal; Notable for the following components:   WBC 17.0 (*)    RBC 3.88 (*)    Hemoglobin 12.2 (*)    HCT 38.5 (*)    Neutro Abs 13.4 (*)    Monocytes Absolute 2.3 (*)    All other components within normal limits  URINALYSIS, COMPLETE (UACMP) WITH MICROSCOPIC - Abnormal; Notable for the following components:   Color, Urine YELLOW (*)    APPearance CLEAR (*)    All other components within normal limits  RESP PANEL BY RT-PCR (FLU A&B, COVID) ARPGX2  CULTURE, BLOOD (ROUTINE X 2)  CULTURE, BLOOD (ROUTINE X 2)  URINE CULTURE  SARS CORONAVIRUS 2 BY RT PCR  PROTIME-INR  APTT  LACTIC ACID, PLASMA  LACTIC ACID, PLASMA     EKG  ED ECG REPORT I, Merlyn Lot, the attending physician, personally viewed and interpreted this ECG.   Date: 01/05/2022  EKG Time: 22:25  Rate: 110  Rhythm: sinus  Axis: normal  Intervals:normal qt  ST&T Change: nonspecific st abn, no depressions    RADIOLOGY Please see ED Course for my review and interpretation.  I personally reviewed all radiographic images ordered to evaluate for the above acute complaints and reviewed radiology reports and findings.  These findings were personally discussed with the patient.  Please see medical record for radiology report.    PROCEDURES:  Critical Care performed: yes  .Critical Care  Performed by: Willy Eddy, MD Authorized by: Willy Eddy, MD   Critical care provider statement:    Critical care time (minutes):  32   Critical care was necessary to treat or prevent imminent or life-threatening deterioration of the following conditions:  Sepsis   Critical care was time spent personally by me on the following activities:  Ordering and performing treatments and  interventions, ordering and review of laboratory studies, ordering and review of radiographic studies, pulse oximetry, re-evaluation of patient's condition, review of old charts, obtaining history from patient or surrogate, examination of patient, evaluation of patient's response to treatment, discussions with primary provider, discussions with consultants and development of treatment plan with patient or surrogate    MEDICATIONS ORDERED IN ED: Medications  lactated ringers infusion (has no administration in time range)  ceFEPIme (MAXIPIME) 2 g in sodium chloride 0.9 % 100 mL IVPB (has no administration in time range)  azithromycin (ZITHROMAX) 500 mg in sodium chloride 0.9 % 250 mL IVPB (has no administration in time range)  vancomycin (VANCOREADY) IVPB 1500 mg/300 mL (has no administration in time range)     IMPRESSION / MDM / ASSESSMENT AND PLAN / ED COURSE  I reviewed the triage vital signs and the nursing notes.                              Differential diagnosis includes, but is not limited to, Dehydration, sepsis, pna, uti, hypoglycemia, CHF, PE, bronchitis, COPDn  Patient presenting to the ER for evaluation of symptoms as described above.  Based on symptoms, risk factors and considered above differential, this presenting complaint could reflect a potentially life-threatening illness therefore the patient will be placed on continuous pulse oximetry and telemetry for monitoring.  Laboratory evaluation will be sent to evaluate for the above complaints.  Patient's mental status seems to be roughly at baseline per review of chart but significantly tachypneic febrile tachycardic consistent with sepsis.  Empiric broad-spectrum antibiotics ordered for the above differential.  Blood work sent further further evaluation.   Clinical Course as of 01/05/22 2337  Caleen Essex Jan 05, 2022  2327 Patient with leukocytosis.  Urinalysis is clean.  PT INR normal.  Chest x-ray on my review and interpretation  without evidence of pneumonia or infiltrate will await formal radiology report.  Patient will be signed out to oncoming physician pending follow-up labs.  Patient will require admission for sepsis. [PR]    Clinical Course User Index [PR] Willy Eddy, MD    FINAL CLINICAL IMPRESSION(S) / ED DIAGNOSES   Final diagnoses:  Sepsis, due to unspecified organism, unspecified whether acute organ dysfunction present [facility])     Rx / DC Orders   ED Discharge Orders     None        Note:  This document was prepared using Dragon voice recognition software and may include unintentional dictation errors.    Willy Eddy, MD 01/05/22 2337

## 2022-01-05 NOTE — ED Notes (Signed)
I&O cath completed by other RN; urine sample sent to lab.

## 2022-01-05 NOTE — ED Notes (Signed)
RT at bedside to do quick eval.

## 2022-01-06 ENCOUNTER — Inpatient Hospital Stay: Payer: BC Managed Care – PPO

## 2022-01-06 ENCOUNTER — Other Ambulatory Visit: Payer: Self-pay

## 2022-01-06 DIAGNOSIS — E669 Obesity, unspecified: Secondary | ICD-10-CM | POA: Diagnosis present

## 2022-01-06 DIAGNOSIS — Z86718 Personal history of other venous thrombosis and embolism: Secondary | ICD-10-CM | POA: Diagnosis not present

## 2022-01-06 DIAGNOSIS — J690 Pneumonitis due to inhalation of food and vomit: Secondary | ICD-10-CM | POA: Insufficient documentation

## 2022-01-06 DIAGNOSIS — I824Y9 Acute embolism and thrombosis of unspecified deep veins of unspecified proximal lower extremity: Secondary | ICD-10-CM

## 2022-01-06 DIAGNOSIS — Z930 Tracheostomy status: Secondary | ICD-10-CM | POA: Diagnosis not present

## 2022-01-06 DIAGNOSIS — Z79899 Other long term (current) drug therapy: Secondary | ICD-10-CM | POA: Diagnosis not present

## 2022-01-06 DIAGNOSIS — I110 Hypertensive heart disease with heart failure: Secondary | ICD-10-CM | POA: Diagnosis present

## 2022-01-06 DIAGNOSIS — Z7901 Long term (current) use of anticoagulants: Secondary | ICD-10-CM | POA: Diagnosis not present

## 2022-01-06 DIAGNOSIS — D649 Anemia, unspecified: Secondary | ICD-10-CM | POA: Diagnosis present

## 2022-01-06 DIAGNOSIS — J9621 Acute and chronic respiratory failure with hypoxia: Secondary | ICD-10-CM | POA: Diagnosis present

## 2022-01-06 DIAGNOSIS — G931 Anoxic brain damage, not elsewhere classified: Secondary | ICD-10-CM | POA: Diagnosis present

## 2022-01-06 DIAGNOSIS — Z86711 Personal history of pulmonary embolism: Secondary | ICD-10-CM | POA: Diagnosis not present

## 2022-01-06 DIAGNOSIS — Z1152 Encounter for screening for COVID-19: Secondary | ICD-10-CM | POA: Diagnosis not present

## 2022-01-06 DIAGNOSIS — B965 Pseudomonas (aeruginosa) (mallei) (pseudomallei) as the cause of diseases classified elsewhere: Secondary | ICD-10-CM | POA: Diagnosis present

## 2022-01-06 DIAGNOSIS — I5022 Chronic systolic (congestive) heart failure: Secondary | ICD-10-CM | POA: Diagnosis not present

## 2022-01-06 DIAGNOSIS — N179 Acute kidney failure, unspecified: Secondary | ICD-10-CM | POA: Diagnosis present

## 2022-01-06 DIAGNOSIS — E875 Hyperkalemia: Secondary | ICD-10-CM | POA: Diagnosis present

## 2022-01-06 DIAGNOSIS — A419 Sepsis, unspecified organism: Secondary | ICD-10-CM | POA: Diagnosis present

## 2022-01-06 DIAGNOSIS — E8809 Other disorders of plasma-protein metabolism, not elsewhere classified: Secondary | ICD-10-CM | POA: Diagnosis present

## 2022-01-06 DIAGNOSIS — J0410 Acute tracheitis without obstruction: Secondary | ICD-10-CM | POA: Diagnosis present

## 2022-01-06 DIAGNOSIS — I10 Essential (primary) hypertension: Secondary | ICD-10-CM | POA: Diagnosis not present

## 2022-01-06 DIAGNOSIS — Z888 Allergy status to other drugs, medicaments and biological substances status: Secondary | ICD-10-CM | POA: Diagnosis not present

## 2022-01-06 DIAGNOSIS — I5023 Acute on chronic systolic (congestive) heart failure: Secondary | ICD-10-CM | POA: Diagnosis present

## 2022-01-06 DIAGNOSIS — Z931 Gastrostomy status: Secondary | ICD-10-CM | POA: Diagnosis not present

## 2022-01-06 DIAGNOSIS — K529 Noninfective gastroenteritis and colitis, unspecified: Secondary | ICD-10-CM | POA: Diagnosis present

## 2022-01-06 DIAGNOSIS — R6520 Severe sepsis without septic shock: Secondary | ICD-10-CM | POA: Diagnosis present

## 2022-01-06 DIAGNOSIS — Z6832 Body mass index (BMI) 32.0-32.9, adult: Secondary | ICD-10-CM | POA: Diagnosis not present

## 2022-01-06 LAB — BLOOD CULTURE ID PANEL (REFLEXED) - BCID2

## 2022-01-06 LAB — RESP PANEL BY RT-PCR (FLU A&B, COVID) ARPGX2
Influenza A by PCR: NEGATIVE
Influenza B by PCR: NEGATIVE
SARS Coronavirus 2 by RT PCR: NEGATIVE

## 2022-01-06 LAB — CORTISOL-AM, BLOOD: Cortisol - AM: 12.5 ug/dL (ref 6.7–22.6)

## 2022-01-06 LAB — PROTIME-INR
INR: 1.2 (ref 0.8–1.2)
Prothrombin Time: 14.8 seconds (ref 11.4–15.2)

## 2022-01-06 LAB — MRSA NEXT GEN BY PCR, NASAL: MRSA by PCR Next Gen: NOT DETECTED

## 2022-01-06 LAB — LACTIC ACID, PLASMA
Lactic Acid, Venous: 2.7 mmol/L (ref 0.5–1.9)
Lactic Acid, Venous: 1.7 mmol/L (ref 0.5–1.9)

## 2022-01-06 LAB — GLUCOSE, CAPILLARY
Glucose-Capillary: 134 mg/dL — ABNORMAL HIGH (ref 70–99)
Glucose-Capillary: 75 mg/dL (ref 70–99)
Glucose-Capillary: 80 mg/dL (ref 70–99)
Glucose-Capillary: 84 mg/dL (ref 70–99)

## 2022-01-06 LAB — PROCALCITONIN: Procalcitonin: <0.1 ng/mL

## 2022-01-06 MED ORDER — METRONIDAZOLE 500 MG/100ML IV SOLN
500.0000 mg | Freq: Two times a day (BID) | INTRAVENOUS | Status: DC
  Administered 2022-01-06 – 2022-01-09 (×6): 500 mg via INTRAVENOUS
  Filled 2022-01-06 (×7): qty 100

## 2022-01-06 MED ORDER — LACTATED RINGERS IV SOLN: INTRAVENOUS | Status: DC

## 2022-01-06 MED ORDER — VALPROIC ACID 250 MG/5ML PO SOLN
750.0000 mg | Freq: Three times a day (TID) | ORAL | Status: DC
  Administered 2022-01-06 – 2022-01-23 (×52): 750 mg
  Filled 2022-01-06 (×60): qty 15

## 2022-01-06 MED ORDER — LEVETIRACETAM 100 MG/ML PO SOLN
750.0000 mg | Freq: Two times a day (BID) | ORAL | Status: DC
  Administered 2022-01-06 – 2022-01-23 (×35): 750 mg
  Filled 2022-01-06 (×36): qty 7.5

## 2022-01-06 MED ORDER — VITAL HIGH PROTEIN PO LIQD: 1000.0000 mL | ORAL | Status: DC

## 2022-01-06 MED ORDER — CLONAZEPAM 0.5 MG PO TABS
0.5000 mg | ORAL_TABLET | Freq: Two times a day (BID) | ORAL | Status: DC
  Administered 2022-01-06 – 2022-01-23 (×35): 0.5 mg
  Filled 2022-01-06 (×36): qty 1

## 2022-01-06 MED ORDER — APIXABAN 5 MG PO TABS
5.0000 mg | ORAL_TABLET | Freq: Two times a day (BID) | ORAL | Status: DC
  Administered 2022-01-06 – 2022-01-12 (×13): 5 mg
  Filled 2022-01-06 (×13): qty 1

## 2022-01-06 MED ORDER — ACETAMINOPHEN 650 MG RE SUPP: 650.0000 mg | Freq: Four times a day (QID) | RECTAL | Status: DC | PRN

## 2022-01-06 MED ORDER — HYDRALAZINE HCL 50 MG PO TABS
50.0000 mg | ORAL_TABLET | Freq: Three times a day (TID) | ORAL | Status: DC
  Administered 2022-01-06 – 2022-01-23 (×51): 50 mg
  Filled 2022-01-06 (×52): qty 1

## 2022-01-06 MED ORDER — VANCOMYCIN HCL 1500 MG/300ML IV SOLN
1500.0000 mg | Freq: Two times a day (BID) | INTRAVENOUS | Status: DC
  Administered 2022-01-06: 1500 mg via INTRAVENOUS
  Filled 2022-01-06 (×2): qty 300

## 2022-01-06 MED ORDER — FREE WATER: 200.0000 mL | Freq: Four times a day (QID) | Status: DC

## 2022-01-06 MED ORDER — FAMOTIDINE 20 MG PO TABS
20.0000 mg | ORAL_TABLET | Freq: Two times a day (BID) | ORAL | Status: DC
  Administered 2022-01-06 – 2022-01-15 (×18): 20 mg
  Filled 2022-01-06 (×18): qty 1

## 2022-01-06 MED ORDER — PROSOURCE TF PO LIQD
45.0000 mL | Freq: Three times a day (TID) | ORAL | Status: DC
  Administered 2022-01-06: 45 mL
  Filled 2022-01-06: qty 45

## 2022-01-06 MED ORDER — IPRATROPIUM-ALBUTEROL 0.5-2.5 (3) MG/3ML IN SOLN: 3.0000 mL | RESPIRATORY_TRACT | Status: DC | PRN

## 2022-01-06 MED ORDER — ALBUTEROL SULFATE (2.5 MG/3ML) 0.083% IN NEBU: 2.5000 mg | INHALATION_SOLUTION | Freq: Four times a day (QID) | RESPIRATORY_TRACT | Status: DC | PRN

## 2022-01-06 MED ORDER — ORAL CARE MOUTH RINSE
15.0000 mL | OROMUCOSAL | Status: DC
  Administered 2022-01-06 – 2022-01-23 (×67): 15 mL via OROMUCOSAL

## 2022-01-06 MED ORDER — LOPERAMIDE HCL 1 MG/7.5ML PO SUSP: 2.0000 mg | Freq: Every day | ORAL | Status: DC | PRN

## 2022-01-06 MED ORDER — POLYETHYLENE GLYCOL 3350 17 G PO PACK: 17.0000 g | PACK | Freq: Every day | ORAL | Status: DC | PRN

## 2022-01-06 MED ORDER — SODIUM CHLORIDE 0.9% FLUSH
3.0000 mL | Freq: Two times a day (BID) | INTRAVENOUS | Status: DC
  Administered 2022-01-06 – 2022-01-23 (×35): 3 mL via INTRAVENOUS

## 2022-01-06 MED ORDER — SCOPOLAMINE 1 MG/3DAYS TD PT72
1.0000 | MEDICATED_PATCH | TRANSDERMAL | Status: DC
  Administered 2022-01-06 – 2022-01-21 (×6): 1.5 mg via TRANSDERMAL
  Filled 2022-01-06 (×6): qty 1

## 2022-01-06 MED ORDER — ORAL CARE MOUTH RINSE: 15.0000 mL | OROMUCOSAL | Status: DC | PRN

## 2022-01-06 MED ORDER — OXYCODONE HCL 5 MG PO TABS: 5.0000 mg | ORAL_TABLET | ORAL | Status: DC | PRN

## 2022-01-06 MED ORDER — TRAZODONE HCL 50 MG PO TABS: 50.0000 mg | ORAL_TABLET | Freq: Every evening | ORAL | Status: DC | PRN

## 2022-01-06 MED ORDER — ACETAMINOPHEN 325 MG PO TABS: 650.0000 mg | ORAL_TABLET | Freq: Four times a day (QID) | ORAL | Status: DC | PRN

## 2022-01-06 MED ORDER — SODIUM CHLORIDE 0.9 % IV BOLUS: 500.0000 mL | Freq: Once | INTRAVENOUS | Status: DC

## 2022-01-06 MED ORDER — GLYCOPYRROLATE 1 MG PO TABS
1.0000 mg | ORAL_TABLET | Freq: Three times a day (TID) | ORAL | Status: DC
  Administered 2022-01-06 – 2022-01-23 (×53): 1 mg
  Filled 2022-01-06 (×54): qty 1

## 2022-01-06 MED ORDER — ATROPINE SULFATE 1 % OP SOLN
2.0000 [drp] | Freq: Four times a day (QID) | OPHTHALMIC | Status: DC
  Administered 2022-01-06 – 2022-01-23 (×67): 2 [drp] via SUBLINGUAL
  Filled 2022-01-06 (×3): qty 2

## 2022-01-06 MED ORDER — CHLORHEXIDINE GLUCONATE CLOTH 2 % EX PADS
6.0000 | MEDICATED_PAD | Freq: Every day | CUTANEOUS | Status: DC
  Administered 2022-01-06 – 2022-01-23 (×16): 6 via TOPICAL

## 2022-01-06 MED ORDER — SODIUM CHLORIDE 0.9 % IV SOLN
2.0000 g | Freq: Three times a day (TID) | INTRAVENOUS | Status: DC
  Administered 2022-01-06 – 2022-01-09 (×10): 2 g via INTRAVENOUS
  Filled 2022-01-06 (×3): qty 2
  Filled 2022-01-06 (×2): qty 12.5
  Filled 2022-01-06 (×6): qty 2
  Filled 2022-01-06: qty 12.5

## 2022-01-06 MED ORDER — NUTRISOURCE FIBER PO PACK
1.0000 | PACK | Freq: Two times a day (BID) | ORAL | Status: DC
  Administered 2022-01-06 – 2022-01-23 (×34): 1
  Filled 2022-01-06 (×37): qty 1

## 2022-01-06 MED ORDER — ONDANSETRON HCL 4 MG PO TABS: 4.0000 mg | ORAL_TABLET | Freq: Four times a day (QID) | ORAL | Status: DC | PRN

## 2022-01-06 MED ORDER — POLYETHYLENE GLYCOL 3350 17 GM/SCOOP PO POWD: 1.0000 | Freq: Every day | ORAL | Status: DC | PRN

## 2022-01-06 MED ORDER — ONDANSETRON HCL 4 MG/2ML IJ SOLN
4.0000 mg | Freq: Four times a day (QID) | INTRAMUSCULAR | Status: DC | PRN
  Administered 2022-01-06 – 2022-01-23 (×4): 4 mg via INTRAVENOUS
  Filled 2022-01-06 (×4): qty 2

## 2022-01-06 NOTE — Progress Notes (Signed)
Patient with #6 shiley flex cuffed trach. Patient from home on 28% venturi trach collar. Cuff noted to be deflated. Patient with bilateral rhonci. Suctioned trach from moderate amount of white secretions. Inner cannula and dressing changed. Patient now on 28% aerosol trach collar. Tolerated interventions well.

## 2022-01-06 NOTE — Progress Notes (Signed)
Trach care list and all necessary supplies at patient bedside.

## 2022-01-06 NOTE — Hospital Course (Addendum)
51 y.o. male with medical history significant for anoxic brain injury and persistent vegetative state status post tracheostomy and PEG tube placement, chronic systolic CHF, hypertension, DVT on chronic anticoagulation, who presents with fever and tachycardia.  Patient had a thick secretion from trach tube, chest x-ray showed bilateral lower lobe atelectasis.  Patient also had a lactic acid level of 2.7. Admitted for sepsis likely from aspiration pneumonia placed on antibiotics cefepime, Flagyl.   Patient developed significant nausea vomiting on 11/4, KUB did not see any acute changes.  Started on Reglan.  Patient wife also had GI symptoms, it appears that patient had gastroenteritis, causing nausea, subsequently caused aspiration pneumonia. Patient antibiotic was switched to Augmentin on 11/7.  Blood culture with Staph capitis in single single set likely contamination Patient continued with full supportive care trach care respiratory care. Patient given few days of IV Lasix swelling has improved weight has returned to his baseline. Overall remains stable given his overall status and always at risk of aspiration/infection/pneumonia bedsores. Advise continued follow-up with his pulmonary team at [facility]

## 2022-01-06 NOTE — Sepsis Progress Note (Signed)
Notified bedside nurse of need to order repeat lactic acid due to 2nd trending up .  

## 2022-01-06 NOTE — Progress Notes (Addendum)
Nutrition Brief Note  Consult received for enteral nutrition initiation and management. Patient is on chronic G-tube bolus feeds at home.   Discussed with RN who reports patient had an episode of emesis today. MD prefers to hold on tube feed initiation today and reassess tomorrow. Discontinued TF protocol order for VHP.   Billy Cherry, RDN, LDN Clinical Nutrition

## 2022-01-06 NOTE — Progress Notes (Signed)
  Progress Note   Patient: Billy Cherry WBL:[id number] DOB: 10-May-1970 DOA: 01/05/2022     0 DOS: the patient was seen and examined on 01/06/2022   Brief hospital course:  Billy Cherry is a 51 y.o. male with medical history significant for anoxic brain injury and persistent vegetative state status post tracheostomy and PEG tube placement, chronic systolic CHF, hypertension, DVT on chronic anticoagulation, who presents with fever and tachycardia.  Patient had a thick secretion from trach tube, chest x-ray showed bilateral lower lobe atelectasis.  Patient also had a lactic acid level of 2.7. Patient condition is consistent with sepsis, most likely due to aspiration pneumonia.  Cefepime started.  Assessment and Plan:  * Severe sepsis ([place]) Aspiration pneumonia bilateral lower lobes. Patient met severe sepsis criteria at time of admission with high fever, tachycardia and leukocytosis and a lactic acidosis.  Source is aspiration pneumonia. No risk for MRSA pneumonia.  We will discontinue vancomycin.  Continue cefepime and Flagyl. Patient is hemodynamically stable.  Anoxic brain injury ([place]) Status post PEG tube and tracheostomy.  Continue Keppra, Depakote, clonazepam Continue atropine sublingual 4 times daily, glycopyrrolate, scopolamine Restarted tube feeding, consult dietitian.  DVT (deep venous thrombosis) ([name]) - Continue Eliquis  HTN (hypertension) Hold off most of blood pressure medicine due to soft blood pressure.  Obesity with bmi 32.55     Subjective:  Patient opening eyes only.  Physical Exam: Vitals:   01/06/22 0900 01/06/22 1000 01/06/22 1100 01/06/22 1200  BP:    95/63  Pulse: 77 81 83 74  Resp: _0 Temp:      TempSrc:      SpO2: 98% 98% 98% 98%  Weight:      Height:       General exam: Appears calm and comfortable  Respiratory system: Clear to auscultation. Respiratory effort normal. Cardiovascular system: S1 & S2 heard, RRR. No JVD, murmurs,  rubs, gallops or clicks. No pedal edema. Gastrointestinal system: Abdomen is nondistended, soft and nontender. No organomegaly or masses felt. Normal bowel sounds heard. Central nervous system: open eyes only Extremities: Symmetric 5 x 5 power. Skin: No rashes, lesions or ulcers   Data Reviewed:  Reviewed chest x-ray and the lab results.  Family Communication: Wife updated over the phone.  Disposition: Status is: Inpatient Remains inpatient appropriate because: wife updated via the phone  Planned Discharge Destination: Home with Hermann    Time spent: No charge  Author: Sharen Hones, MD 01/06/2022 2:27 PM  For on call review [email or web address].

## 2022-01-06 NOTE — Assessment & Plan Note (Addendum)
-   Consult wound nurse for full evaluation - Continue Keppra, Depakote, clonazepam - Continue atropine sublingual 4 times daily, glycopyrrolate, scopolamine

## 2022-01-06 NOTE — Assessment & Plan Note (Signed)
-   Continue Eliquis 

## 2022-01-06 NOTE — Progress Notes (Signed)
Pharmacy Antibiotic Note  Billy Cherry is a 51 y.o. male admitted on 01/05/2022 with HCAP.  Pharmacy has been consulted for Cefepime & Vancomycin dosing for 7 days.  Plan: Cefepime  2 gm q8hr per indication & renal fxn.  Pt given Vancomycin 1500 mg once. Vancomycin 1500 mg IV Q 12 hrs. Goal AUC 400-550. Expected AUC: 438.1 SCr used: 0.96  Pharmacy will continue to follow and will adjust abx dosing whenever warranted.  Temp (24hrs), Avg:101.2 F (38.4 C), Min:100.9 F (38.3 C), Max:101.5 F (38.6 C)   Recent Labs  Lab 01/05/22 2236 01/06/22 0049  WBC 17.0*  --   CREATININE 0.96  --   LATICACIDVEN 2.5* 2.7*    Estimated Creatinine Clearance: 116 mL/min (by C-G formula based on SCr of 0.96 mg/dL).    Allergies  Allergen Reactions   Zestril [Lisinopril] Swelling    angioedema    Antimicrobials this admission: 11/03 Azithromycin >> x 1 dose 11/03 Cefepime >> x 7 days 11/03 Vancomycin >> x 7 days  Microbiology results: 11/03 BCx: Pending 11/03 UCx: Pending  11/03 Sputum: Pending   Thank you for allowing pharmacy to be a part of this patient's care.  Renda Rolls, PharmD, MBA 01/06/2022 1:20 AM

## 2022-01-06 NOTE — Assessment & Plan Note (Addendum)
Most likely source appears to be pulmonary given thick secretions coming from the tracheostomy tube, has been recently admitted so will cover with broad-spectrum antibiotics.  If no improvement would consider imaging to evaluate for other sources. - Cefepime per pharmacy consult - Vancomycin per pharmacy consult - Trend lactic acid to normal - Check procalcitonin in a.m., if normal would look for alternative sources - Follow-up urine and blood cultures - Collect and follow-up on sputum culture

## 2022-01-06 NOTE — H&P (Signed)
History and Physical    Patient: Billy Cherry WNI:[id number] DOB: Jul 15, 1970 DOA: 01/05/2022 DOS: the patient was seen and examined on 01/06/2022 PCP: [facility] [place]  Patient coming from: Home  Chief Complaint:  Chief Complaint  Patient presents with   Fever   HPI: Que Meneely is a 51 y.o. male with medical history significant for anoxic brain injury and persistent vegetative state status post tracheostomy and PEG tube placement, chronic systolic CHF, hypertension, DVT on chronic anticoagulation, who presents with fever and tachycardia.  History obtained from chart and signout from ED provider as patient is unable to participate in interview due to anoxic brain injury. Per review of chart patient was brought in from home due to fever, tachycardia, and thick secretions from his tracheostomy tube.  No additional history given.  In the ED vital signs notable for fever to 101.5 Fahrenheit, tachycardia in the low 100s to 110s, tachypnea, and low normal O2 saturations in the low 90s with tracheostomy collar at 28%.  CBC notable for white count of 17, mild anemia with hemoglobin 12.2, CMP was unremarkable.  Lactic acid was elevated at 2.5, chest x-ray showed low lung volumes and no obvious infiltrates.  Blood and urine cultures were obtained.  Respiratory viral panel was obtained.  He was given a dose of azithromycin, cefepime, vancomycin, 1 L NS bolus, and admitted for further management.  Review of Systems: unable to review all systems due to the inability of the patient to answer questions. Past Medical History:  Diagnosis Date   Hypertension    Past Surgical History:  Procedure Laterality Date   BACK SURGERY     CORONARY/GRAFT ACUTE MI REVASCULARIZATION N/A 03/23/2021   Procedure: Coronary/Graft Acute MI Revascularization;  Surgeon: Isaias Cowman, MD;  Location: [facility];  Service: Cardiovascular;  Laterality: N/A;   IR GASTROSTOMY TUBE MOD SED  04/26/2021    LEFT HEART CATH AND CORONARY ANGIOGRAPHY N/A 03/23/2021   Procedure: LEFT HEART CATH AND CORONARY ANGIOGRAPHY;  Surgeon: Isaias Cowman, MD;  Location: [facility];  Service: Cardiovascular;  Laterality: N/A;   SHOULDER SURGERY     Social History:  reports that he has never smoked. He has never used smokeless tobacco. He reports current alcohol use. He reports that he does not use drugs.  Allergies  Allergen Reactions   Zestril [Lisinopril] Swelling    angioedema    No family history on file.  Prior to Admission medications   Medication Sig Start Date End Date Taking? Authorizing Provider  apixaban (ELIQUIS) 5 MG TABS tablet Place 1 tablet (5 mg total) into feeding tube 2 (two) times daily. 11/14/21 05/13/22 Yes Elwin Mocha, MD  atropine 1 % ophthalmic solution Place 2 drops under the tongue 4 (four) times daily. 11/07/21  Yes Elwin Mocha, MD  carvedilol (COREG) 25 MG tablet Place 1 tablet (25 mg total) into feeding tube 2 (two) times daily with a meal. 11/14/21 05/13/22 Yes Elwin Mocha, MD  clonazePAM (KLONOPIN) 0.5 MG tablet Place 1 tablet (0.5 mg total) into feeding tube 2 (two) times daily. 11/14/21 01/05/22 Yes Elwin Mocha, MD  doxazosin (CARDURA) 2 MG tablet Place 1 tablet (2 mg total) into feeding tube daily. 11/14/21 05/13/22 Yes Elwin Mocha, MD  famotidine (PEPCID) 20 MG tablet Place 1 tablet (20 mg total) into feeding tube 2 (two) times daily. 11/14/21 05/13/22 Yes Elwin Mocha, MD  fiber (NUTRISOURCE FIBER) PACK packet Place 1 packet into feeding tube 2 (two)  times daily. 10/19/21  Yes Dwyane Dee, MD  fluticasone (FLONASE) 50 MCG/ACT nasal spray Place 2 sprays into both nostrils daily. 12/29/21  Yes Elwin Mocha, MD  glycopyrrolate (ROBINUL) 1 MG tablet Place 1 tablet (1 mg total) into feeding tube 3 (three) times daily. 11/14/21 05/13/22 Yes Elwin Mocha, MD  hydrALAZINE (APRESOLINE) 50 MG tablet Place 1 tablet (50 mg total) into feeding  tube 3 (three) times daily. 11/14/21 05/13/22 Yes Elwin Mocha, MD  isosorbide dinitrate (ISORDIL) 30 MG tablet PLACE 1 TABLET (30 MG TOTAL) INTO FEEDING TUBE 3 (THREE) TIMES DAILY. 12/29/21  Yes Elwin Mocha, MD  levETIRAcetam (KEPPRA) 100 MG/ML solution PLACE 7.5 MLS (750 MG TOTAL) INTO FEEDING TUBE 2 (TWO) TIMES DAILY. 12/29/21  Yes Elwin Mocha, MD  Nutritional Supplements (FEEDING SUPPLEMENT, PROSOURCE TF,) liquid Place 45 mLs into feeding tube 3 (three) times daily. 12/27/21 02/25/22 Yes Elwin Mocha, MD  scopolamine (TRANSDERM-SCOP) 1 MG/3DAYS Place 1 patch (1.5 mg total) onto the skin every 3 (three) days. 11/14/21 02/12/22 Yes Elwin Mocha, MD  spironolactone (ALDACTONE) 25 MG tablet Place 1 tablet (25 mg total) into feeding tube daily. 11/14/21 02/12/22 Yes Elwin Mocha, MD  valproic acid (DEPAKENE) 250 MG/5ML solution Place 15 mLs (750 mg total) into feeding tube 3 (three) times daily. 11/14/21 05/13/22 Yes Elwin Mocha, MD  ZINC OXIDE, TOPICAL, 25 % OINT Apply 1 Application topically 2 (two) times daily. To affected area. 12/27/21  Yes Elwin Mocha, MD  acetaminophen (TYLENOL) 325 MG tablet Place 2 tablets (650 mg total) into feeding tube every 6 (six) hours as needed for mild pain or fever. 11/14/21 02/12/22  Elwin Mocha, MD  albuterol (PROVENTIL) (2.5 MG/3ML) 0.083% nebulizer solution Take 3 mLs (2.5 mg total) by nebulization every 6 (six) hours as needed for wheezing or shortness of breath. 11/11/21   Elwin Mocha, MD  chlorhexidine gluconate, MEDLINE KIT, (PERIDEX) 0.12 % solution 15 mLs by Mouth Rinse route 2 (two) times daily. 03/30/21   Bradly Bienenstock, NP  GAVILAX 17 GM/SCOOP powder Place 1 capful (17 g) into feeding tube daily as needed for mild constipation or moderate constipation. 12/29/21   Elwin Mocha, MD  ipratropium-albuterol (DUONEB) 0.5-2.5 (3) MG/3ML SOLN TAKE 3 MLS BY NEBULIZATION EVERY 4 (FOUR) HOURS AS NEEDED (WHEEZING, SHORTNESS OF  BREATH). USE FIRST BEFORE ALBUTEROL 12/15/21 03/15/22  Elwin Mocha, MD  Loperamide HCl 1 MG/7.5ML LIQD Place 15 mLs (2 mg total) into feeding tube daily as needed for diarrhea or loose stools. 11/14/21   Elwin Mocha, MD  Mouthwashes (MOUTH RINSE) LIQD solution 15 mLs by Mouth Rinse route every 4 (four) hours. 03/30/21   Bradly Bienenstock, NP  Nebulizer System All-In-One MISC 1 Inhalation by Does not apply route every hour as needed. 11/11/21   Elwin Mocha, MD  Nutritional Supplements (FEEDING SUPPLEMENT, OSMOLITE 1.5 CAL,) LIQD Place 1,000 mLs into feeding tube continuous. 10/04/21   Shelly Coss, MD  Water For Irrigation, Sterile (FREE WATER) SOLN Place 200 mLs into feeding tube every 6 (six) hours. 08/07/21   Flora Lipps, MD    Physical Exam: Vitals:   01/05/22 2303 01/05/22 2304 01/06/22 0000 01/06/22 0013  BP:  118/82 106/89   Pulse: (!) 105  (!) 105   Resp: (!) 27 (!) 23 (!) 40   Temp:    (!) 100.9 F (38.3 C)  TempSrc:    Rectal  SpO2: 93%  93% 96%  Weight:      Height:       Physical Exam Constitutional:      Appearance: He is ill-appearing.     Comments: Not responsive to voice or tactile stimuli, constant roving eye movements  HENT:     Head:     Comments: Tracheostomy present, patient occasionally coughing and expelling thick white mucus/sputum    Nose: No congestion or rhinorrhea.  Eyes:     General: No scleral icterus. Pulmonary:     Breath sounds: Rhonchi present. No wheezing or rales.  Abdominal:     General: Abdomen is flat. Bowel sounds are normal. There is no distension.     Palpations: Abdomen is soft.     Tenderness: There is no abdominal tenderness.  Musculoskeletal:     Right lower leg: No edema.     Left lower leg: No edema.  Skin:    General: Skin is warm and dry.     Findings: No rash.  Neurological:     Comments: Not responsive to stimuli, constant roving eye movements, arms held in extension     Data Reviewed: Results for orders  placed or performed during the hospital encounter of 01/05/22 (from the past 24 hour(s))  Lactic acid, plasma     Status: Abnormal   Collection Time: 01/05/22 10:36 PM  Result Value Ref Range   Lactic Acid, Venous 2.5 (HH) 0.5 - 1.9 mmol/L  Comprehensive metabolic panel     Status: Abnormal   Collection Time: 01/05/22 10:36 PM  Result Value Ref Range   Sodium 137 135 - 145 mmol/L   Potassium 4.9 3.5 - 5.1 mmol/L   Chloride 100 98 - 111 mmol/L   CO2 29 22 - 32 mmol/L   Glucose, Bld 124 (H) 70 - 99 mg/dL   BUN 44 (H) 6 - 20 mg/dL   Creatinine, Ser 0.96 0.61 - 1.24 mg/dL   Calcium 9.3 8.9 - 10.3 mg/dL   Total Protein 7.2 6.5 - 8.1 g/dL   Albumin 3.0 (L) 3.5 - 5.0 g/dL   AST 31 15 - 41 U/L   ALT 18 0 - 44 U/L   Alkaline Phosphatase 58 38 - 126 U/L   Total Bilirubin 0.7 0.3 - 1.2 mg/dL   GFR, Estimated >60 >60 mL/min   Anion gap 8 5 - 15  CBC with Differential     Status: Abnormal   Collection Time: 01/05/22 10:36 PM  Result Value Ref Range   WBC 17.0 (H) 4.0 - 10.5 K/uL   RBC 3.88 (L) 4.22 - 5.81 MIL/uL   Hemoglobin 12.2 (L) 13.0 - 17.0 g/dL   HCT 38.5 (L) 39.0 - 52.0 %   MCV 99.2 80.0 - 100.0 fL   MCH 31.4 26.0 - 34.0 pg   MCHC 31.7 30.0 - 36.0 g/dL   RDW 14.1 11.5 - 15.5 %   Platelets 158 150 - 400 K/uL   nRBC 0.0 0.0 - 0.2 %   Neutrophils Relative % 79 %   Neutro Abs 13.4 (H) 1.7 - 7.7 K/uL   Lymphocytes Relative 7 %   Lymphs Abs 1.1 0.7 - 4.0 K/uL   Monocytes Relative 14 %   Monocytes Absolute 2.3 (H) 0.1 - 1.0 K/uL   Eosinophils Relative 0 %   Eosinophils Absolute 0.1 0.0 - 0.5 K/uL   Basophils Relative 0 %   Basophils Absolute 0.0 0.0 - 0.1 K/uL   Immature Granulocytes 0 %   Abs Immature Granulocytes 0.07 0.00 - 0.07  K/uL  Protime-INR     Status: None   Collection Time: 01/05/22 10:36 PM  Result Value Ref Range   Prothrombin Time 15.2 11.4 - 15.2 seconds   INR 1.2 0.8 - 1.2  APTT     Status: None   Collection Time: 01/05/22 10:36 PM  Result Value Ref Range    aPTT 35 24 - 36 seconds  Urinalysis, Complete w Microscopic     Status: Abnormal   Collection Time: 01/05/22 10:36 PM  Result Value Ref Range   Color, Urine YELLOW (A) YELLOW   APPearance CLEAR (A) CLEAR   Specific Gravity, Urine 1.018 1.005 - 1.030   pH 8.0 5.0 - 8.0   Glucose, UA NEGATIVE NEGATIVE mg/dL   Hgb urine dipstick NEGATIVE NEGATIVE   Bilirubin Urine NEGATIVE NEGATIVE   Ketones, ur NEGATIVE NEGATIVE mg/dL   Protein, ur NEGATIVE NEGATIVE mg/dL   Nitrite NEGATIVE NEGATIVE   Leukocytes,Ua NEGATIVE NEGATIVE   RBC / HPF 21-50 0 - 5 RBC/hpf   WBC, UA NONE SEEN 0 - 5 WBC/hpf   Bacteria, UA NONE SEEN NONE SEEN   Squamous Epithelial / LPF 0-5 0 - 5  Lactic acid, plasma     Status: Abnormal   Collection Time: 01/06/22 12:49 AM  Result Value Ref Range   Lactic Acid, Venous 2.7 (HH) 0.5 - 1.9 mmol/L  Glucose, capillary     Status: Abnormal   Collection Time: 01/06/22  1:17 AM  Result Value Ref Range   Glucose-Capillary 134 (H) 70 - 99 mg/dL    DG Chest Port 1 View CLINICAL DATA:  Questionable sepsis - evaluate for abnormality  EXAM: PORTABLE CHEST 1 VIEW  COMPARISON:  Radiograph 09/04/2021  FINDINGS: Tracheostomy tube tip at the thoracic inlet. Persistent low lung volumes. Stable heart size and mediastinal contours. Subsegmental atelectasis in the lung bases. No confluent consolidation. No pleural fluid, pneumothorax or pulmonary edema. Stable osseous structures.  IMPRESSION: 1. Low lung volumes with bibasilar atelectasis. 2. Tracheostomy tube tip at the thoracic inlet.  Electronically Signed   By: Keith Rake M.D.   On: 01/05/2022 23:11    Assessment and Plan: Domonique Brouillard is a 51 y.o. male with medical history significant for anoxic brain injury and persistent vegetative state status post tracheostomy and PEG tube placement, chronic systolic CHF, hypertension, DVT on chronic anticoagulation, who presents with fever, tachycardia, and lactic acidosis  consistent with severe sepsis from presumed pulmonary source.  * Severe sepsis ([place]) Most likely source appears to be pulmonary given thick secretions coming from the tracheostomy tube, has been recently admitted so will cover with broad-spectrum antibiotics.  If no improvement would consider imaging to evaluate for other sources. - Cefepime per pharmacy consult - Vancomycin per pharmacy consult - Trend lactic acid to normal - Check procalcitonin in a.m., if normal would look for alternative sources - Follow-up urine and blood cultures - Collect and follow-up on sputum culture  Anoxic brain injury ([place]) - Consult wound nurse for full evaluation - Continue Keppra, Depakote, clonazepam - Continue atropine sublingual 4 times daily, glycopyrrolate, scopolamine  DVT (deep venous thrombosis) ([name]) - Continue Eliquis  HTN (hypertension) Relatively soft blood pressures in setting of sepsis.  Patient has a significant blood pressure medication regimen.  Plan to continue hydralazine to avoid significant rebound hypertension, however will hold remainder of meds for now, reevaluate in a.m. - Continue hydralazine 50 mg 3 times daily via G-tube - Hold carvedilol, doxazosin, Isordil, spironolactone  S/P percutaneous endoscopic gastrostomy (PEG)  tube placement [facility]) Consult dietitian for tube feed orders Continue PPI  Tracheostomy dependent ([place]) - Continue DuoNebs as needed      Advance Care Planning:   Code Status: Full Code   Consults: none  Family Communication: none  Severity of Illness: The appropriate patient status for this patient is INPATIENT. Inpatient status is judged to be reasonable and necessary in order to provide the required intensity of service to ensure the patient's safety. The patient's presenting symptoms, physical exam findings, and initial radiographic and laboratory data in the context of their chronic comorbidities is felt to place them at high risk for further  clinical deterioration. Furthermore, it is not anticipated that the patient will be medically stable for discharge from the hospital within 2 midnights of admission.   * I certify that at the point of admission it is my clinical judgment that the patient will require inpatient hospital care spanning beyond 2 midnights from the point of admission due to high intensity of service, high risk for further deterioration and high frequency of surveillance required.*  Author: Clarnce Flock, MD 01/06/2022 1:27 AM  For on call review [email or web address].

## 2022-01-06 NOTE — Assessment & Plan Note (Addendum)
Consult dietitian for tube feed orders Continue PPI

## 2022-01-06 NOTE — Assessment & Plan Note (Addendum)
Relatively soft blood pressures in setting of sepsis.  Patient has a significant blood pressure medication regimen.  Plan to continue hydralazine to avoid significant rebound hypertension, however will hold remainder of meds for now, reevaluate in a.m. - Continue hydralazine 50 mg 3 times daily via G-tube - Hold carvedilol, doxazosin, Isordil, spironolactone

## 2022-01-06 NOTE — Assessment & Plan Note (Signed)
-   Continue DuoNebs as needed

## 2022-01-06 NOTE — Progress Notes (Signed)
At 1515 patient vomited a moderate amount of yellow clear content, patient given zofran IV and Dr. Roosevelt Locks notified.  Dr. Roosevelt Locks placed orders to initiate IV fluids and hold off on beginning PEG tube feedings today.

## 2022-01-06 NOTE — Progress Notes (Signed)
PHARMACY - PHYSICIAN COMMUNICATION CRITICAL VALUE ALERT - BLOOD CULTURE IDENTIFICATION (BCID)  Billy Cherry is an 51 y.o. male who presented to [facility] on 01/05/2022 with a chief complaint of  anoxic brain injury and persistent vegetative state status.  Name of physician (or Provider) Contacted: Dr Sidney Ace  Current antibiotics: Cefepime  Changes to prescribed antibiotics recommended:  Add Vancomycin per pharmacy consult  Results for orders placed or performed during the hospital encounter of 01/05/22  Blood Culture ID Panel (Reflexed) (Collected: 01/05/2022 10:36 PM)  Result Value Ref Range   Enterococcus faecalis NOT DETECTED NOT DETECTED   Enterococcus Faecium NOT DETECTED NOT DETECTED   Listeria monocytogenes NOT DETECTED NOT DETECTED   Staphylococcus species DETECTED (A) NOT DETECTED   Staphylococcus aureus (BCID) NOT DETECTED NOT DETECTED   Staphylococcus epidermidis NOT DETECTED NOT DETECTED   Staphylococcus lugdunensis NOT DETECTED NOT DETECTED   Streptococcus species NOT DETECTED NOT DETECTED   Streptococcus agalactiae NOT DETECTED NOT DETECTED   Streptococcus pneumoniae NOT DETECTED NOT DETECTED   Streptococcus pyogenes NOT DETECTED NOT DETECTED   A.calcoaceticus-baumannii NOT DETECTED NOT DETECTED   Bacteroides fragilis NOT DETECTED NOT DETECTED   Enterobacterales NOT DETECTED NOT DETECTED   Enterobacter cloacae complex NOT DETECTED NOT DETECTED   Escherichia coli NOT DETECTED NOT DETECTED   Klebsiella aerogenes NOT DETECTED NOT DETECTED   Klebsiella oxytoca NOT DETECTED NOT DETECTED   Klebsiella pneumoniae NOT DETECTED NOT DETECTED   Proteus species NOT DETECTED NOT DETECTED   Salmonella species NOT DETECTED NOT DETECTED   Serratia marcescens NOT DETECTED NOT DETECTED   Haemophilus influenzae NOT DETECTED NOT DETECTED   Neisseria meningitidis NOT DETECTED NOT DETECTED   Pseudomonas aeruginosa NOT DETECTED NOT DETECTED   Stenotrophomonas maltophilia NOT DETECTED  NOT DETECTED   Candida albicans NOT DETECTED NOT DETECTED   Candida auris NOT DETECTED NOT DETECTED   Candida glabrata NOT DETECTED NOT DETECTED   Candida krusei NOT DETECTED NOT DETECTED   Candida parapsilosis NOT DETECTED NOT DETECTED   Candida tropicalis NOT DETECTED NOT DETECTED   Cryptococcus neoformans/gattii NOT DETECTED NOT DETECTED    Lashala Laser Rodriguez-Guzman PharmD, BCPS 01/06/2022 10:22 PM

## 2022-01-06 NOTE — Plan of Care (Signed)
Continuing with plan of care. 

## 2022-01-06 NOTE — Progress Notes (Signed)
Pharmacy Antibiotic Note  Billy Cherry is a 51 y.o. male admitted on 01/05/2022 with ?aspiration pneumonia/sepsis.  Pharmacy has been consulted for Cefepime dosing for 7 days.  -Vancomcyin d/c 01/06/22   - MRSA PCR negative  Plan: Cefepime  2 gm q8hr per indication & renal fxn. -Metronidazole added 01/06/22  Pharmacy will continue to follow and will adjust abx dosing whenever warranted.  Temp (24hrs), Avg:99.6 F (37.6 C), Min:97.9 F (36.6 C), Max:101.5 F (38.6 C)   Recent Labs  Lab 01/05/22 2236 01/06/22 0049 01/06/22 0711  WBC 17.0*  --   --   CREATININE 0.96  --   --   LATICACIDVEN 2.5* 2.7* 1.7     Estimated Creatinine Clearance: 116 mL/min (by C-G formula based on SCr of 0.96 mg/dL).    Allergies  Allergen Reactions   Zestril [Lisinopril] Swelling    angioedema    Antimicrobials this admission: 11/03 Azithromycin x 1 dose 11/03 Vancomycin >> 11/4 11/03 Cefepime >>      x 7 days 11/4  metronidazole >>  Microbiology results: 11/03 BCx: NGTD 11/03 UCx: Pending  11/03 Sputum: Pending  MRSA PCR neg  Thank you for allowing pharmacy to be a part of this patient's care.  Chinita Greenland PharmD Clinical Pharmacist 01/06/2022

## 2022-01-06 NOTE — Progress Notes (Signed)
Pharmacy Antibiotic Note  Billy Cherry is a 51 y.o. male admitted on 01/05/2022 with HCAP.  Pharmacy has been consulted for Cefepime & Vancomycin dosing for 7 days.  Plan: Cefepime  2 gm q8hr per indication & renal fxn. 2. Metronidazole added 01/06/22  3. Resume Vancomycin 1500 mg IV Q 12 hrs. Goal AUC 400-550. Expected AUC: 438.1 SCr used: 0.96  Pharmacy will continue to follow and will adjust abx dosing whenever warranted.  Temp (24hrs), Avg:99 F (37.2 C), Min:97.6 F (36.4 C), Max:101.5 F (38.6 C)   Recent Labs  Lab 01/05/22 2236 01/06/22 0049 01/06/22 0711  WBC 17.0*  --   --   CREATININE 0.96  --   --   LATICACIDVEN 2.5* 2.7* 1.7     Estimated Creatinine Clearance: 116 mL/min (by C-G formula based on SCr of 0.96 mg/dL).    Allergies  Allergen Reactions   Zestril [Lisinopril] Swelling    angioedema    Antimicrobials this admission: 11/03 Azithromycin >> x 1 dose 11/03 Cefepime >>  11/03 Vancomycin >>   Microbiology results: 11/03 BCx: Staph species, no resistance ( 1 of 4 bottles) 11/03 UCx: Pending  11/03 Sputum: Pending   Thank you for allowing pharmacy to be a part of this patient's care.  Daysia Vandenboom Rodriguez-Guzman PharmD, BCPS 01/06/2022 10:13 PM

## 2022-01-07 ENCOUNTER — Inpatient Hospital Stay: Payer: BC Managed Care – PPO

## 2022-01-07 DIAGNOSIS — R6520 Severe sepsis without septic shock: Secondary | ICD-10-CM

## 2022-01-07 DIAGNOSIS — J690 Pneumonitis due to inhalation of food and vomit: Secondary | ICD-10-CM

## 2022-01-07 DIAGNOSIS — Z930 Tracheostomy status: Secondary | ICD-10-CM

## 2022-01-07 DIAGNOSIS — G931 Anoxic brain damage, not elsewhere classified: Secondary | ICD-10-CM | POA: Diagnosis not present

## 2022-01-07 DIAGNOSIS — A419 Sepsis, unspecified organism: Secondary | ICD-10-CM | POA: Diagnosis not present

## 2022-01-07 LAB — PHOSPHORUS: Phosphorus: 4.3 mg/dL (ref 2.5–4.6)

## 2022-01-07 LAB — BASIC METABOLIC PANEL
Anion gap: 7 (ref 5–15)
BUN: 39 mg/dL — ABNORMAL HIGH (ref 6–20)
CO2: 27 mmol/L (ref 22–32)
Calcium: 9.6 mg/dL (ref 8.9–10.3)
Chloride: 106 mmol/L (ref 98–111)
Creatinine, Ser: 0.68 mg/dL (ref 0.61–1.24)
GFR, Estimated: >60 mL/min (ref >60)
Glucose, Bld: 73 mg/dL (ref 70–99)
Potassium: 4.4 mmol/L (ref 3.5–5.1)
Sodium: 140 mmol/L (ref 135–145)

## 2022-01-07 LAB — MAGNESIUM: Magnesium: 1.8 mg/dL (ref 1.7–2.4)

## 2022-01-07 LAB — URINE CULTURE

## 2022-01-07 LAB — GLUCOSE, CAPILLARY
Glucose-Capillary: 77 mg/dL (ref 70–99)
Glucose-Capillary: 70 mg/dL (ref 70–99)
Glucose-Capillary: 73 mg/dL (ref 70–99)
Glucose-Capillary: 78 mg/dL (ref 70–99)
Glucose-Capillary: 86 mg/dL (ref 70–99)

## 2022-01-07 MED ORDER — PANTOPRAZOLE SODIUM 40 MG IV SOLR
40.0000 mg | Freq: Every day | INTRAVENOUS | Status: DC
  Administered 2022-01-07 – 2022-01-23 (×17): 40 mg via INTRAVENOUS
  Filled 2022-01-07 (×17): qty 10

## 2022-01-07 MED ORDER — FREE WATER
200.0000 mL | Freq: Four times a day (QID) | Status: DC
  Administered 2022-01-07 – 2022-01-23 (×62): 200 mL

## 2022-01-07 MED ORDER — METOCLOPRAMIDE HCL 5 MG/ML IJ SOLN
5.0000 mg | Freq: Three times a day (TID) | INTRAMUSCULAR | Status: DC
  Administered 2022-01-07 – 2022-01-09 (×6): 5 mg via INTRAVENOUS
  Filled 2022-01-07 (×7): qty 2

## 2022-01-07 MED ORDER — PROSOURCE TF20 ENFIT COMPATIBL EN LIQD
60.0000 mL | Freq: Every day | ENTERAL | Status: DC
  Administered 2022-01-07 – 2022-01-23 (×16): 60 mL

## 2022-01-07 MED ORDER — LANSOPRAZOLE 3 MG/ML SUSP: 30.0000 mg | Freq: Every day | ORAL | Status: DC

## 2022-01-07 MED ORDER — OSMOLITE 1.5 CAL PO LIQD
118.0000 mL | Freq: Four times a day (QID) | ORAL | Status: DC
  Administered 2022-01-07 – 2022-01-08 (×3): 118 mL

## 2022-01-07 NOTE — Plan of Care (Signed)
Continuing with plan of care. 

## 2022-01-07 NOTE — Progress Notes (Signed)
Pharmacy Antibiotic Note  Ardit Danh is a 51 y.o. male admitted on 01/05/2022 with ?aspiration PNA.  Pharmacy has been consulted for Cefepime & dosing for 7 days.  -Vancomycin d/c 01/07/22 - recent [facility] admission 10/6-10/12/23 for Afib,?asp PNA  Plan: Cefepime  2 gm q8hr per indication & renal fxn. 2. Metronidazole added 01/06/22   Pharmacy will continue to follow and will adjust abx dosing whenever warranted.  Temp (24hrs), Avg:98.1 F (36.7 C), Min:97.7 F (36.5 C), Max:98.4 F (36.9 C)   Recent Labs  Lab 01/05/22 2236 01/06/22 0049 01/06/22 0711 01/07/22 0440  WBC 17.0*  --   --   --   CREATININE 0.96  --   --  0.68  LATICACIDVEN 2.5* 2.7* 1.7  --      Estimated Creatinine Clearance: 129.6 mL/min (by C-G formula based on SCr of 0.68 mg/dL).    Allergies  Allergen Reactions   Zestril [Lisinopril] Swelling    angioedema    Antimicrobials this admission: 11/03 Azithromycin >> x 1 dose 11/03 Cefepime >>  11/03 Vancomycin >> 11/5 11/4 Metronidazole>>  Microbiology results: 11/03 BCx: Staph capitis, no resistance ( 1 of 4 bottles) 11/03 UCx: mult species 11/03 Sputum: Pending  MRSA PCR neg  Thank you for allowing pharmacy to be a part of this patient's care.  Chinita Greenland PharmD Clinical Pharmacist 01/07/2022

## 2022-01-07 NOTE — Progress Notes (Signed)
Initial Nutrition Assessment RD working remotely.   DOCUMENTATION CODES:   Not applicable  INTERVENTION:  - ordered 1/2 carton (118 ml) Osmolite 1.5 QID with 60 ml Prosource TF20 once/day and 200 ml free water QID.  - goal regimen: 1.5 cartons (355 ml) Osmolite 1.5 QID with 60 ml Prosource TF20 once/day and 200 ml free water QID.  - goal regimen will provide 2210 kcal, 109 grams protein, and 1524 ml free water.   NUTRITION DIAGNOSIS:   Increased nutrient needs related to acute illness as evidenced by estimated needs.  GOAL:   Patient will meet greater than or equal to 90% of their needs  MONITOR:   TF tolerance, Labs, Weight trends, I & O's  REASON FOR ASSESSMENT:   Consult Enteral/tube feeding initiation and management  ASSESSMENT:   51 y.o. male with medical history of anoxic brain injury and persistent vegetative state s/p tracheostomy and PEG tube placement, chronic systolic CHF, HTN, and DVT on chronic anticoagulation. He presented to the ED from home due to fever, tachycardia, and thick secretions from his trach.  Able to communicate with RD who worked remotely on 11/4 via secure chat and MD this AM via secure chat.  Patient with PEG from PTA and on bolus TF regimen at home. Patient was having episodes of emesis yesterday so TF not resumed. Last documented episode was yesterday at ~1615. This AM MD shares no further episodes of emesis and that it is ok to resume TF via PEG at this time.   Patient was assessed by RDs during hospitalization in August at which time he was receiving ~1.5 cartons (355 ml) Osmolite 1.5 QID with 45 ml Prosource TF once/day and 200 ml free water QID.   Patient is NPO now and at baseline.   Weight today is 206 lb which is consistent with weight on 10/23/21. Generalized deep pitting edema documented in the edema section of flow sheet yesterday evening; continue to monitor closely.   Labs reviewed; CBGs: 77 and 70 mg/dl, BUN: 39  mg/dl.  Medications reviewed; 20 mg pepcid BID per tube, 1 packet nutrisource fiber BID per tube.   IVF; LR @ 75 ml/hr.    NUTRITION - FOCUSED PHYSICAL EXAM:  RD working remotely.  Diet Order:   Diet Order             Diet NPO time specified  Diet effective now                   EDUCATION NEEDS:   No education needs have been identified at this time  Skin:  Skin Assessment: Reviewed RN Assessment  Last BM:  11/4 (type 6 x1, small amount)  Height:   Ht Readings from Last 1 Encounters:  01/05/22 6' (1.829 m)    Weight:   Wt Readings from Last 1 Encounters:  01/07/22 93.4 kg     BMI:  Body mass index is 27.93 kg/m.  Estimated Nutritional Needs:  Kcal:  1900-2100 kcal Protein:  90-105 grams Fluid:  >/= 2.1 L/day      Billy Matin, MS, RD, LDN, CNSC Clinical Dietitian PRN/Relief staff On-call/weekend pager # available in [facility]

## 2022-01-07 NOTE — Progress Notes (Addendum)
  Progress Note   Patient: Billy Cherry [id number]:[id number] DOB: 06-02-1970 DOA: 01/05/2022     1 DOS: the patient was seen and examined on 01/07/2022   Brief hospital course:  Billy Cherry is a 51 y.o. male with medical history significant for anoxic brain injury and persistent vegetative state status post tracheostomy and PEG tube placement, chronic systolic CHF, hypertension, DVT on chronic anticoagulation, who presents with fever and tachycardia.  Patient had a thick secretion from trach tube, chest x-ray showed bilateral lower lobe atelectasis.  Patient also had a lactic acid level of 2.7. Patient condition is consistent with sepsis, most likely due to aspiration pneumonia.  Cefepime started.  Also added Flagyl. Patient developed significant nausea vomiting on 11/4, KUB did not see any acute changes.  Started the Reglan.  Assessment and Plan: * Severe sepsis ([name]) Aspiration pneumonia bilateral lower lobes. Patient met severe sepsis criteria at time of admission with high fever, tachycardia and leukocytosis and a lactic acidosis.  Source is aspiration pneumonia. Continue antibiotics with cefepime and Flagyl.   Anoxic brain injury ([name]) Status post PEG tube and tracheostomy.  Continue Keppra, Depakote, clonazepam Continue atropine sublingual 4 times daily, glycopyrrolate, scopolamine Patient had intermittent vomiting since yesterday, abdominal film did not show any acute changes.  We will start IV Reglan, then restart tube feeding 2 hours afterwards.  Also started PPI. Patient had a bowel movement yesterday, does not seem to have any constipation.  Chronic systolic congestive heart failure Patient does not have volume overload, she is on IV fluids due to unable to start tube feeding.  Reduce to 50 ml/h.   DVT (deep venous thrombosis) ([name]) Continue Eliquis   HTN (hypertension) Hold off most of blood pressure medicine due to soft blood pressure.   Obesity with bmi 32.55       Subjective: Patient opening eyes only.  Does not seem to be in pain.  Physical Exam: Vitals:   01/07/22 0414 01/07/22 0700 01/07/22 0800 01/07/22 0802  BP: (!) 144/98 125/81 (!) 140/105 (!) 140/105  Pulse: 76 66 74 88  Resp: _0 Temp: 98.1 F (36.7 C)  98 F (36.7 C)   TempSrc: Axillary  Axillary   SpO2: 100% 99% 98% 100%  Weight: 93.4 kg     Height:       General exam: Open eyes only Respiratory system: Clear to auscultation. Respiratory effort normal. Cardiovascular system: S1 & S2 heard, RRR. No JVD, murmurs, rubs, gallops or clicks. No pedal edema. Gastrointestinal system: Abdomen is nondistended, soft and nontender. No organomegaly or masses felt. Normal bowel sounds heard. Central nervous system: Ope eyes only Extremities: Symmetric 5 x 5 power. Skin: No rashes, lesions or ulcers    Data Reviewed:  Lab results reviewed.  Family Communication: wife updated  Disposition: Status is: Inpatient Remains inpatient appropriate because: Severity of disease, IV treatment.  Planned Discharge Destination: Home with Home Health    Time spent: 55 minutes  Author: Sharen Hones, MD 01/07/2022 11:49 AM  For on call review [email or web address].

## 2022-01-08 ENCOUNTER — Other Ambulatory Visit: Payer: Self-pay

## 2022-01-08 DIAGNOSIS — A419 Sepsis, unspecified organism: Secondary | ICD-10-CM | POA: Diagnosis not present

## 2022-01-08 DIAGNOSIS — G931 Anoxic brain damage, not elsewhere classified: Secondary | ICD-10-CM | POA: Diagnosis not present

## 2022-01-08 DIAGNOSIS — I5022 Chronic systolic (congestive) heart failure: Secondary | ICD-10-CM | POA: Diagnosis not present

## 2022-01-08 DIAGNOSIS — J690 Pneumonitis due to inhalation of food and vomit: Secondary | ICD-10-CM | POA: Diagnosis not present

## 2022-01-08 LAB — BASIC METABOLIC PANEL
Anion gap: 6 (ref 5–15)
BUN: 31 mg/dL — ABNORMAL HIGH (ref 6–20)
CO2: 28 mmol/L (ref 22–32)
Calcium: 9.4 mg/dL (ref 8.9–10.3)
Chloride: 105 mmol/L (ref 98–111)
Creatinine, Ser: 0.68 mg/dL (ref 0.61–1.24)
GFR, Estimated: >60 mL/min (ref >60)
Glucose, Bld: 105 mg/dL — ABNORMAL HIGH (ref 70–99)
Potassium: 3.8 mmol/L (ref 3.5–5.1)
Sodium: 139 mmol/L (ref 135–145)

## 2022-01-08 LAB — CULTURE, BLOOD (ROUTINE X 2)

## 2022-01-08 LAB — GLUCOSE, CAPILLARY
Glucose-Capillary: 83 mg/dL (ref 70–99)
Glucose-Capillary: 93 mg/dL (ref 70–99)
Glucose-Capillary: 103 mg/dL — ABNORMAL HIGH (ref 70–99)
Glucose-Capillary: 80 mg/dL (ref 70–99)
Glucose-Capillary: 127 mg/dL — ABNORMAL HIGH (ref 70–99)

## 2022-01-08 LAB — MAGNESIUM: Magnesium: 1.9 mg/dL (ref 1.7–2.4)

## 2022-01-08 MED ORDER — OSMOLITE 1.5 CAL PO LIQD
355.0000 mL | Freq: Four times a day (QID) | ORAL | Status: DC
  Administered 2022-01-08 – 2022-01-23 (×57): 355 mL

## 2022-01-08 MED ORDER — CARVEDILOL 25 MG PO TABS
25.0000 mg | ORAL_TABLET | Freq: Two times a day (BID) | ORAL | Status: DC
  Administered 2022-01-08 – 2022-01-12 (×8): 25 mg
  Filled 2022-01-08 (×8): qty 1

## 2022-01-08 NOTE — TOC Initial Note (Signed)
Transition of Care [facility]) - Initial/Assessment Note    Patient Details  Name: Billy Cherry MRN: [id number] Date of Birth: 1970-10-14  Transition of Care [facility]) CM/SW Contact:    Margarito Liner, LCSW Phone Number: 01/08/2022, 3:21 PM  Clinical Narrative: Readmission prevention screen complete. Wife not at bedside so called her, introduced role, and explained that discharge planning would be discussed. PCP is Petra Kuba, MD at [facility]. Patient transports to appointments using EMS. [facility] [address]. Wife reports sometimes having transportation issues to pick up his medications. Patient has private duty nursing through [employer]. Wife reports he is supposed to get 112 hours per week. She also said he thinks he has a pending referral to [facility]. [facility] liaison said this is likely through their private duty nursing department. Left voicemail for their liaison. Patient has oxygen, suction, trach mask, and peg tube feeds at home all of which are provided through [employer]. No further concerns. CSW encouraged patient's wife to contact CSW as needed. CSW will continue to follow patient and his wife for support and facilitate return home once stable. Wife confirmed he will need EMS transport home and address on facesheet is correct.                 Expected Discharge Plan: Home w Home Health Services (Private duty nursing) Barriers to Discharge: Continued Medical Work up   Patient Goals and CMS Choice     Choice offered to / list presented to : NA  Expected Discharge Plan and Services Expected Discharge Plan: Home w Home Health Services (Private duty nursing)     Post Acute Care Choice: Resumption of Svcs/PTA Provider Living arrangements for the past 2 months: Single Family Home                                      Prior Living Arrangements/Services Living arrangements for the past 2 months: Single Family Home Lives with:: Spouse Patient language and need for  interpreter reviewed:: Yes Do you feel safe going back to the place where you live?: Yes      Need for Family Participation in Patient Care: Yes (Comment) Care giver support system in place?: Yes (comment) Current home services: DME, Homehealth aide Criminal Activity/Legal Involvement Pertinent to Current Situation/Hospitalization: No - Comment as needed  Activities of Daily Living Home Assistive Devices/Equipment: Oxygen, Feeding equipment    Permission Sought/Granted Permission sought to share information with : Family Supports, Magazine features editor    Share Information with NAME: Theola Sequin  Permission granted to share info w AGENCY: [facility], [facility]  Permission granted to share info w Relationship: Wife  Permission granted to share info w Contact Information: [phone]  Emotional Assessment Appearance:: Appears stated age Attitude/Demeanor/Rapport: Unable to Assess Affect (typically observed): Unable to Assess   Alcohol / Substance Use: Not Applicable Psych Involvement: No (comment)  Admission diagnosis:  Severe sepsis ([name]) [A41.9, R65.20] Sepsis, due to unspecified organism, unspecified whether acute organ dysfunction present [facility]) [A41.9] Patient Active Problem List   Diagnosis Date Noted   Severe sepsis ([name]) 01/06/2022   Aspiration pneumonia of both lower lobes ([name]) 01/06/2022   Pressure injury of buttock, stage 2 ([name]) 10/17/2021   Pressure injury of right heel, stage 1 10/17/2021   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   S/P percutaneous endoscopic gastrostomy (PEG) tube placement ([name]) 10/04/2021   Obesity (BMI 30-39.9) 08/16/2021  Pulmonary embolus ([place]) 06/06/2021   Pressure injury of skin 05/25/2021   DVT (deep venous thrombosis) ([place]) 05/16/2021   Dysphagia 05/01/2021   HTN (hypertension) 04/24/2021   Sepsis ([place]) 04/20/2021   HAP (hospital-acquired pneumonia) 24/58/0998   Chronic systolic CHF (congestive heart failure) ([place]) 04/05/2021    Anoxic brain injury ([place]) 04/03/2021   Myoclonus 03/30/2021   Chronic respiratory failure with hypoxia [facility])    History of cardiac arrest 03/23/2021   PCP:  Physicians, [place]:   [id number] [place] [place] - [address] [place] [address] Phone: [phone] Fax: [place] [address] [place] [address] Phone: [phone] Fax: [phone]     Social Determinants of Health (SDOH) Interventions    Readmission Risk Interventions    01/08/2022    3:18 PM 04/14/2021   11:14 AM  Readmission Risk Prevention Plan  Transportation Screening Complete Complete  PCP or Specialist Appt within 3-5 Days Complete   HRI or [place] Complete Complete  Social Work Consult for [place] Planning/Counseling Complete Complete  Palliative Care Screening Not Applicable Complete  Medication Review Press photographer) Complete Complete

## 2022-01-08 NOTE — Progress Notes (Signed)
Pharmacy Antibiotic Note  Billy Cherry is a 51 y.o. male admitted on 01/05/2022 with concern for aspiration PNA. He was recently admitted to [name] 10/6-12 for management of Afib and possible aspiration pneumonia. 11/3 CXR with mild patchy densities at both lower lungs, 11/4 procalcitonin negative. Pharmacy has been consulted for initiation and management of Cefepime.  Plan: Day 3 of antibiotics Continue Cefepime 2 g IV Q8H Patient is also on Metronidazole 500 mg IV Q12H Continue to monitor renal function and follow culture results  Pharmacy will continue to follow and will adjust abx dosing whenever warranted.  Temp (24hrs), Avg:98.2 F (36.8 C), Min:97.8 F (36.6 C), Max:98.6 F (37 C)   Recent Labs  Lab 01/05/22 2236 01/06/22 0049 01/06/22 0711 01/07/22 0440 01/08/22 0509  WBC 17.0*  --   --   --   --   CREATININE 0.96  --   --  0.68 0.68  LATICACIDVEN 2.5* 2.7* 1.7  --   --      Estimated Creatinine Clearance: 130.7 mL/min (by C-G formula based on SCr of 0.68 mg/dL).    Allergies  Allergen Reactions   Zestril [Lisinopril] Swelling    angioedema    Antimicrobials this admission: 11/4 Azithromycin x 1  11/4 Vancomycin >> 11/5 11/4 Cefepime >>  11/4 Metronidazole>>  Microbiology results: 11/3 BCx: 1 of 4 bottles (anaerobic) Staph capitis 11/3 UCx: multiple species present 11/3 Sputum: needs to be collected   11/4 MRSA PCR: neg  Thank you for allowing pharmacy to be a part of this patient's care.  Gretel Acre, PharmD PGY1 Pharmacy Resident 01/08/2022 8:07 AM

## 2022-01-08 NOTE — Progress Notes (Signed)
  Progress Note   Patient: Billy Cherry [id number]:[id number] DOB: 08-20-1970 DOA: 01/05/2022     2 DOS: the patient was seen and examined on 01/08/2022   Brief hospital course:  Billy Cherry is a 51 y.o. male with medical history significant for anoxic brain injury and persistent vegetative state status post tracheostomy and PEG tube placement, chronic systolic CHF, hypertension, DVT on chronic anticoagulation, who presents with fever and tachycardia.  Patient had a thick secretion from trach tube, chest x-ray showed bilateral lower lobe atelectasis.  Patient also had a lactic acid level of 2.7. Patient condition is consistent with sepsis, most likely due to aspiration pneumonia.  Cefepime started.  Also added Flagyl. Patient developed significant nausea vomiting on 11/4, KUB did not see any acute changes.  Started the Reglan.  Assessment and Plan:  Severe sepsis ([place]) Aspiration pneumonia bilateral lower lobes. Patient met severe sepsis criteria at time of admission with high fever, tachycardia and leukocytosis and a lactic acidosis.  Source is aspiration pneumonia. Blood culture came back with Staphylococcus capitis in 1 bottle, appeared to be secondary to contamination. Continue current antibiotics with cefepime and Flagyl for another day.   Anoxic brain injury ([place]) Status post PEG tube and tracheostomy.  Continue Keppra, Depakote, clonazepam Continue atropine sublingual 4 times daily, glycopyrrolate, scopolamine Patient had intermittent vomiting since yesterday, abdominal film did not show any acute changes.  Started IV Reglan, PPI.  Patient was able to tolerate tube feeding now.   Chronic systolic congestive heart failure No volume overload, discontinue IV fluids as patient is tolerating tube feeding.   DVT (deep venous thrombosis) ([name]) Continue Eliquis   HTN (hypertension) Restarted Coreg, hydralazine.   Obesity with bmi 32.55     Subjective: Does not have any  complaints.  Physical Exam: Vitals:   01/08/22 0500 01/08/22 0741 01/08/22 0802 01/08/22 1140  BP:  (!) 145/105  (!) 157/126  Pulse:  80  86  Resp:  18  16  Temp:  97.8 F (36.6 C)  98.1 F (36.7 C)  TempSrc:      SpO2:  98% 95% 97%  Weight: 95.2 kg     Height:       General exam: Appears calm and nonverbal Respiratory system: Clear to auscultation. Respiratory effort normal. Cardiovascular system: S1 & S2 heard, RRR. No JVD, murmurs, rubs, gallops or clicks. No pedal edema. Gastrointestinal system: Abdomen is nondistended, soft and nontender. No organomegaly or masses felt. Normal bowel sounds heard. Central nervous system: Alert and nonverbal Extremities: Symmetric 5 x 5 power. Skin: No rashes, lesions or ulcers   Data Reviewed:  Lab results reviewed.  Family Communication: Wife updated.  Disposition: Status is: Inpatient Remains inpatient appropriate because: Severity of disease, IV treatment.  Planned Discharge Destination: Home with Home Health    Time spent: 35 minutes  Author: Sharen Hones, MD 01/08/2022 2:29 PM  For on call review [email or web address].

## 2022-01-08 NOTE — Consult Note (Signed)
[place] Nurse Consult Note: Patient receiving care in [facility]. Primary RN, Rod, present at time of my assessment. Reason for Consult: "sacral" I lifted the prophylactic sacral foam dressing and found NO wound.  There is a pink spot on each buttock, but these are not open.  Discussed plan of care with the bedside nurse.  [place] nurse will not follow at this time.  Please re-consult the [place] team if needed.  Val Riles, RN, MSN, CWOCN, CNS-BC, pager [phone]

## 2022-01-08 NOTE — Plan of Care (Signed)

## 2022-01-08 NOTE — Progress Notes (Signed)
Nutrition Follow-up  DOCUMENTATION CODES:   Not applicable  INTERVENTION:   -TF via g-tube:  355 ml (1.5 cartons) Osmolite 1.5 QID  60 ml Prosource TF daily  200 ml free water QID  Regimen provides 2210 kcals, and 109 grams protein 1086 ml free water daily. Total free water: 1886 ml daily  NUTRITION DIAGNOSIS:   Increased nutrient needs related to acute illness as evidenced by estimated needs.  Ongoing  GOAL:   Patient will meet greater than or equal to 90% of their needs  Progressing   MONITOR:   TF tolerance, Labs, Weight trends, I & O's  REASON FOR ASSESSMENT:   Consult Enteral/tube feeding initiation and management  ASSESSMENT:   51 y.o. male with medical history of anoxic brain injury and persistent vegetative state s/p tracheostomy and PEG tube placement, chronic systolic CHF, HTN, and DVT on chronic anticoagulation. He presented to the ED from home due to fever, tachycardia, and thick secretions from his trach.  Reviewed I/O's: +1.7 L x 24 hours and +3.1 L since admission  UOP: 1 L x 24 hours   Pt sitting up in bed, on trach collar. Case discussed with RN, who reports pt is tolerating TF well, but has had increased secretions today.   Pt unable to provide further history.   Medications reviewed and include keppra and depakene.   Labs reviewed: CBGS: 78-103 (inpatient orders for glycemic control are none).    NUTRITION - FOCUSED PHYSICAL EXAM:  Flowsheet Row Most Recent Value  Orbital Region No depletion  Upper Arm Region No depletion  Thoracic and Lumbar Region No depletion  Buccal Region No depletion  Temple Region No depletion  Clavicle Bone Region Mild depletion  Clavicle and Acromion Bone Region No depletion  Scapular Bone Region No depletion  Dorsal Hand No depletion  Patellar Region Mild depletion  Anterior Thigh Region Mild depletion  Posterior Calf Region Mild depletion  Edema (RD Assessment) Moderate  Hair Reviewed  Eyes Reviewed   Mouth Reviewed  Skin Reviewed  Nails Reviewed       Diet Order:   Diet Order             Diet NPO time specified  Diet effective now                   EDUCATION NEEDS:   No education needs have been identified at this time  Skin:  Skin Assessment: Reviewed RN Assessment  Last BM:  01/06/22  Height:   Ht Readings from Last 1 Encounters:  01/05/22 6' (1.829 m)    Weight:   Wt Readings from Last 1 Encounters:  01/08/22 95.2 kg    Ideal Body Weight:  80.9 kg  BMI:  Body mass index is 28.46 kg/m.  Estimated Nutritional Needs:   Kcal:  1900-2100 kcal  Protein:  90-105 grams  Fluid:  >/= 2.1 L/day    Loistine Chance, RD, LDN, CDCES Registered Dietitian II Certified Diabetes Care and Education Specialist Please refer to [facility] for RD and/or RD on-call/weekend/after hours pager

## 2022-01-09 DIAGNOSIS — J690 Pneumonitis due to inhalation of food and vomit: Secondary | ICD-10-CM | POA: Diagnosis not present

## 2022-01-09 DIAGNOSIS — A419 Sepsis, unspecified organism: Secondary | ICD-10-CM | POA: Diagnosis not present

## 2022-01-09 DIAGNOSIS — K529 Noninfective gastroenteritis and colitis, unspecified: Secondary | ICD-10-CM | POA: Diagnosis not present

## 2022-01-09 DIAGNOSIS — G931 Anoxic brain damage, not elsewhere classified: Secondary | ICD-10-CM | POA: Diagnosis not present

## 2022-01-09 LAB — GLUCOSE, CAPILLARY
Glucose-Capillary: 116 mg/dL — ABNORMAL HIGH (ref 70–99)
Glucose-Capillary: 125 mg/dL — ABNORMAL HIGH (ref 70–99)
Glucose-Capillary: 143 mg/dL — ABNORMAL HIGH (ref 70–99)
Glucose-Capillary: 59 mg/dL — ABNORMAL LOW (ref 70–99)
Glucose-Capillary: 82 mg/dL (ref 70–99)
Glucose-Capillary: 92 mg/dL (ref 70–99)
Glucose-Capillary: 96 mg/dL (ref 70–99)

## 2022-01-09 LAB — BASIC METABOLIC PANEL
Anion gap: 5 (ref 5–15)
BUN: 31 mg/dL — ABNORMAL HIGH (ref 6–20)
CO2: 28 mmol/L (ref 22–32)
Calcium: 9.4 mg/dL (ref 8.9–10.3)
Chloride: 105 mmol/L (ref 98–111)
Creatinine, Ser: 0.71 mg/dL (ref 0.61–1.24)
GFR, Estimated: >60 mL/min (ref >60)
Glucose, Bld: 84 mg/dL (ref 70–99)
Potassium: 4 mmol/L (ref 3.5–5.1)
Sodium: 138 mmol/L (ref 135–145)

## 2022-01-09 LAB — CBC
HCT: 34.3 % — ABNORMAL LOW (ref 39.0–52.0)
Hemoglobin: 11.1 g/dL — ABNORMAL LOW (ref 13.0–17.0)
MCH: 31 pg (ref 26.0–34.0)
MCHC: 32.4 g/dL (ref 30.0–36.0)
MCV: 95.8 fL (ref 80.0–100.0)
Platelets: 175 10*3/uL (ref 150–400)
RBC: 3.58 MIL/uL — ABNORMAL LOW (ref 4.22–5.81)
RDW: 13.4 % (ref 11.5–15.5)
WBC: 6.1 10*3/uL (ref 4.0–10.5)
nRBC: 0 % (ref 0.0–0.2)

## 2022-01-09 LAB — MAGNESIUM: Magnesium: 1.8 mg/dL (ref 1.7–2.4)

## 2022-01-09 LAB — PHOSPHORUS: Phosphorus: 2.8 mg/dL (ref 2.5–4.6)

## 2022-01-09 MED ORDER — LACTULOSE 10 GM/15ML PO SOLN
20.0000 g | Freq: Once | ORAL | Status: AC
  Administered 2022-01-09: 20 g
  Filled 2022-01-09: qty 30

## 2022-01-09 MED ORDER — AMOXICILLIN-POT CLAVULANATE 600-42.9 MG/5ML PO SUSR
500.0000 mg | Freq: Three times a day (TID) | ORAL | Status: AC
  Administered 2022-01-09 – 2022-01-10 (×5): 500 mg
  Filled 2022-01-09 (×5): qty 4.17

## 2022-01-09 MED ORDER — METOCLOPRAMIDE HCL 5 MG/ML IJ SOLN
10.0000 mg | Freq: Three times a day (TID) | INTRAMUSCULAR | Status: DC
  Administered 2022-01-09 – 2022-01-12 (×9): 10 mg via INTRAVENOUS
  Filled 2022-01-09 (×10): qty 2

## 2022-01-09 MED ORDER — METOCLOPRAMIDE HCL 10 MG/10ML PO SOLN
10.0000 mg | Freq: Three times a day (TID) | ORAL | Status: DC
  Filled 2022-01-09: qty 10

## 2022-01-09 MED ORDER — METRONIDAZOLE 50 MG/ML ORAL SUSPENSION: 500.0000 mg | Freq: Two times a day (BID) | ORAL | Status: DC

## 2022-01-09 NOTE — Progress Notes (Signed)
  Progress Note   Patient: Billy Cherry YPP:[id number] DOB: [birth year] DOA: 01/05/2022     3 DOS: the patient was seen and examined on 01/09/2022   Brief hospital course:  Billy Cherry is a 51 y.o. male with medical history significant for anoxic brain injury and persistent vegetative state status post tracheostomy and PEG tube placement, chronic systolic CHF, hypertension, DVT on chronic anticoagulation, who presents with fever and tachycardia.  Patient had a thick secretion from trach tube, chest x-ray showed bilateral lower lobe atelectasis.  Patient also had a lactic acid level of 2.7. Patient condition is consistent with sepsis, most likely due to aspiration pneumonia.  Cefepime started.  Also added Flagyl. Patient developed significant nausea vomiting on 11/4, KUB did not see any acute changes.  Started the Reglan.  Patient wife also had GI symptoms, it appears that patient had gastroenteritis, causing nausea, subsequently caused aspiration pneumonia. Patient antibiotic was switched to Augmentin on 11/7.    Assessment and Plan:  Severe sepsis ([place]) Aspiration pneumonia bilateral lower lobes. Patient met severe sepsis criteria at time of admission with high fever, tachycardia and leukocytosis and a lactic acidosis.  Source is aspiration pneumonia. Blood culture came back with Staphylococcus capitis in 1 bottle, appeared to be secondary to contamination. Condition improved, antibiotic changed to oral Augmentin.   Anoxic brain injury ([place]) Status post PEG tube and tracheostomy. Gastroenteritis.  Continue Keppra, Depakote, clonazepam Continue atropine sublingual 4 times daily, glycopyrrolate, scopolamine Patient still has intermittent nausea and vomiting.  I will continue IV Reglan.  Patient has not had a bowel movement since admission, will give a dose of lactulose.  Not ready for discharge today.  Chronic systolic congestive heart failure No volume overload, discontinue IV fluids as  patient is tolerating tube feeding.   DVT (deep venous thrombosis) ([name]) Continue Eliquis   HTN (hypertension) Restarted Coreg, hydralazine.   Obesity with bmi 32.55     Subjective:  Patient still has intermittent nausea and vomiting.  But mostly tolerating bolus tube feeding.  Physical Exam: Vitals:   01/09/22 0451 01/09/22 0815 01/09/22 1154 01/09/22 1549  BP: (!) 141/112 (!) 160/113 (!) 143/112   Pulse: 91 77 98   Resp: _0 Temp: 99 F (37.2 C) 98.2 F (36.8 C) 98.3 F (36.8 C)   TempSrc: Axillary  Oral   SpO2: 97% 100% 98% 96%  Weight:      Height:       General exam: Appears calm and comfortable  Respiratory system: Clear to auscultation. Respiratory effort normal. Cardiovascular system: S1 & S2 heard, RRR. No JVD, murmurs, rubs, gallops or clicks. No pedal edema. Gastrointestinal system: Abdomen is nondistended, soft and nontender. No organomegaly or masses felt. Normal bowel sounds heard. Central nervous system: Alert and nonverbal. Extremities: Symmetric 5 x 5 power. Skin: No rashes, lesions or ulcers   Data Reviewed:  Lab results reviewed.  Family Communication: Not able to reach wife today  Disposition: Status is: Inpatient Remains inpatient appropriate because: Severity of disease, still vomiting.  Planned Discharge Destination: Home with Home Health    Time spent: 35 minutes  Author: Sharen Hones, MD 01/09/2022 4:39 PM  For on call review [email or web address].

## 2022-01-09 NOTE — TOC Progression Note (Signed)
Transition of Care [facility]) - Progression Note    Patient Details  Name: Billy Cherry MRN: [id number] Date of Birth: 1971-02-20  Transition of Care [facility]) CM/SW [place], LCSW Phone Number: 01/09/2022, 11:09 AM  Clinical Narrative:   Received call from [facility] with [facility] Duty Nursing. They were supposed to start services today but will delay due to hospitalization. This was to be in addition to [facility] private duty services to provide an additional 45 hours.  Expected Discharge Plan: [place] (Private duty nursing) Barriers to Discharge: Continued Medical Work up  Expected Discharge Plan and Services Expected Discharge Plan: [place] (Private duty nursing)     Post Acute Care Choice: Resumption of Svcs/PTA Provider Living arrangements for the past 2 months: Single Family Home                                       Social Determinants of Health (SDOH) Interventions    Readmission Risk Interventions    01/08/2022    3:18 PM 04/14/2021   11:14 AM  Readmission Risk Prevention Plan  Transportation Screening Complete Complete  PCP or Specialist Appt within 3-5 Days Complete   HRI or [place] Complete Complete  Social Work Consult for [place] Planning/Counseling Complete Complete  Palliative Care Screening Not Applicable Complete  Medication Review Press photographer) Complete Complete

## 2022-01-09 NOTE — Plan of Care (Signed)

## 2022-01-10 ENCOUNTER — Other Ambulatory Visit: Payer: Self-pay

## 2022-01-10 DIAGNOSIS — A419 Sepsis, unspecified organism: Secondary | ICD-10-CM | POA: Diagnosis not present

## 2022-01-10 DIAGNOSIS — R6520 Severe sepsis without septic shock: Secondary | ICD-10-CM | POA: Diagnosis not present

## 2022-01-10 LAB — CBC
HCT: 36.7 % — ABNORMAL LOW (ref 39.0–52.0)
Hemoglobin: 11.9 g/dL — ABNORMAL LOW (ref 13.0–17.0)
MCH: 31.9 pg (ref 26.0–34.0)
MCHC: 32.4 g/dL (ref 30.0–36.0)
MCV: 98.4 fL (ref 80.0–100.0)
Platelets: 190 10*3/uL (ref 150–400)
RBC: 3.73 MIL/uL — ABNORMAL LOW (ref 4.22–5.81)
RDW: 13.5 % (ref 11.5–15.5)
WBC: 5.8 10*3/uL (ref 4.0–10.5)
nRBC: 0 % (ref 0.0–0.2)

## 2022-01-10 LAB — BASIC METABOLIC PANEL
Anion gap: 6 (ref 5–15)
BUN: 29 mg/dL — ABNORMAL HIGH (ref 6–20)
CO2: 30 mmol/L (ref 22–32)
Calcium: 9.3 mg/dL (ref 8.9–10.3)
Chloride: 105 mmol/L (ref 98–111)
Creatinine, Ser: 0.72 mg/dL (ref 0.61–1.24)
GFR, Estimated: >60 mL/min (ref >60)
Glucose, Bld: 129 mg/dL — ABNORMAL HIGH (ref 70–99)
Potassium: 4.1 mmol/L (ref 3.5–5.1)
Sodium: 141 mmol/L (ref 135–145)

## 2022-01-10 LAB — GLUCOSE, CAPILLARY
Glucose-Capillary: 88 mg/dL (ref 70–99)
Glucose-Capillary: 106 mg/dL — ABNORMAL HIGH (ref 70–99)
Glucose-Capillary: 64 mg/dL — ABNORMAL LOW (ref 70–99)
Glucose-Capillary: 119 mg/dL — ABNORMAL HIGH (ref 70–99)
Glucose-Capillary: 73 mg/dL (ref 70–99)
Glucose-Capillary: 128 mg/dL — ABNORMAL HIGH (ref 70–99)
Glucose-Capillary: 128 mg/dL — ABNORMAL HIGH (ref 70–99)

## 2022-01-10 LAB — MAGNESIUM: Magnesium: 1.9 mg/dL (ref 1.7–2.4)

## 2022-01-10 LAB — CULTURE, BLOOD (ROUTINE X 2)
Culture: NO GROWTH
Special Requests: ADEQUATE

## 2022-01-10 LAB — PHOSPHORUS: Phosphorus: 2.6 mg/dL (ref 2.5–4.6)

## 2022-01-10 MED ORDER — SPIRONOLACTONE 25 MG PO TABS
25.0000 mg | ORAL_TABLET | Freq: Every day | ORAL | Status: DC
  Administered 2022-01-10 – 2022-01-14 (×4): 25 mg
  Filled 2022-01-10 (×4): qty 1

## 2022-01-10 MED ORDER — FUROSEMIDE 20 MG PO TABS
20.0000 mg | ORAL_TABLET | Freq: Once | ORAL | Status: AC
  Administered 2022-01-10: 20 mg via ORAL
  Filled 2022-01-10: qty 1

## 2022-01-10 MED ORDER — DEXTROSE 50 % IV SOLN
INTRAVENOUS | Status: AC
  Filled 2022-01-10: qty 50

## 2022-01-10 MED ORDER — DEXTROSE 50 % IV SOLN
12.5000 g | INTRAVENOUS | Status: AC
  Administered 2022-01-10: 12.5 g via INTRAVENOUS

## 2022-01-10 NOTE — Progress Notes (Signed)
Nutrition Follow-up  DOCUMENTATION CODES:   Not applicable  INTERVENTION:   -TF via g-tube:   355 ml (1.5 cartons) Osmolite 1.5 QID   60 ml Prosource TF daily   200 ml free water QID   Regimen provides 2210 kcals, and 109 grams protein 1086 ml free water daily. Total free water: 1886 ml daily  NUTRITION DIAGNOSIS:   Increased nutrient needs related to acute illness as evidenced by estimated needs.  Ongoing  GOAL:   Patient will meet greater than or equal to 90% of their needs  Met with TF  MONITOR:   TF tolerance, Labs, Weight trends, I & O's  REASON FOR ASSESSMENT:   Consult Enteral/tube feeding initiation and management  ASSESSMENT:   51 y.o. male with medical history of anoxic brain injury and persistent vegetative state s/p tracheostomy and PEG tube placement, chronic systolic CHF, HTN, and DVT on chronic anticoagulation. He presented to the ED from home due to fever, tachycardia, and thick secretions from his trach.  Reviewed I/O's: -120 ml x 24 hours and +2.1 L since admission   Pt receiving nursing care at time of visit.   Pt with improved tolerance of TF secondary to reglan.   Plan to discharge home in care of wife and private duty nursing.  Medications reviewed and include keppra and reglan.   Labs reviewed: CBGS: 64-119.   Diet Order:   Diet Order             Diet NPO time specified  Diet effective now                   EDUCATION NEEDS:   No education needs have been identified at this time  Skin:  Skin Assessment: Reviewed RN Assessment  Last BM:  01/10/22  Height:   Ht Readings from Last 1 Encounters:  01/05/22 6' (1.829 m)    Weight:   Wt Readings from Last 1 Encounters:  01/08/22 95.2 kg    Ideal Body Weight:  80.9 kg  BMI:  Body mass index is 28.46 kg/m.  Estimated Nutritional Needs:   Kcal:  1900-2100 kcal  Protein:  90-105 grams  Fluid:  >/= 2.1 L/day    Loistine Chance, RD, LDN, CDCES Registered  Dietitian II Certified Diabetes Care and Education Specialist Please refer to [facility] for RD and/or RD on-call/weekend/after hours pager

## 2022-01-10 NOTE — Progress Notes (Signed)
PROGRESS NOTE Billy Cherry  [id number] DOB: 11/05/1970 DOA: 01/05/2022 PCP: [facility]   Brief Narrative/Hospital Course: 51 y.o. male with medical history significant for anoxic brain injury and persistent vegetative state status post tracheostomy and PEG tube placement, chronic systolic CHF, hypertension, DVT on chronic anticoagulation, who presents with fever and tachycardia.  Patient had a thick secretion from trach tube, chest x-ray showed bilateral lower lobe atelectasis.  Patient also had a lactic acid level of 2.7. Admitted for sepsis likely from aspiration pneumonia placed on antibiotics cefepime, Flagyl.   Patient developed significant nausea vomiting on 11/4, KUB did not see any acute changes.  Started on Reglan.  Patient wife also had GI symptoms, it appears that patient had gastroenteritis, causing nausea, subsequently caused aspiration pneumonia. Patient antibiotic was switched to Augmentin on 11/7.  Blood culture with Staph capitis in single single set likely contaminatio    Subjective: Seen and examined this morning discussed with nursing staff Opens his eyes to pain, otherwise no other response Overnight afebrile Tmax 99, on trach collar 5 L blood pressure 130s to 160s respiration 17-24 Labs with a stable renal function, normal WBC with mild anemia  Assessment and Plan: Principal Problem:   Severe sepsis ([name]) Active Problems:   Anoxic brain injury ([place])   DVT (deep venous thrombosis) ([name])   HTN (hypertension)   Chronic systolic CHF (congestive heart failure) ([name])   Tracheostomy dependent ([name])   S/P percutaneous endoscopic gastrostomy (PEG) tube placement ([place])   Aspiration pneumonia of both lower lobes ([place])   Gastroenteritis  Severe sepsis due to aspiration pneumonia Aspiration pneumonia: Remains afebrile hemodynamically stable, respiratory status stable.  Pro-Cal and WBC count stable.  On bolus tube feeding continue, continue Augmentin to  cover.  Blood culture BC ID with Staph capitis likely contamination from single site.  Having oral secretion continue scopolamine patch, respiratory care. Consulted respiratory therapist as wife informed me that for the last 1 month he has been doing cuffed trach due to recurrent aspiration> apparently when the doctor came to check on him in the house there was a hole in his cuff. Recent Labs  Lab 01/05/22 2236 01/06/22 0049 01/06/22 0711 01/09/22 0758 01/10/22 0559  WBC 17.0*  --   --  6.1 5.8  LATICACIDVEN 2.5* 2.7* 1.7  --   --   PROCALCITON  --   --  <0.10  --   --     Acute gastroenteritis: Having loose BM, continue supportive care.  His tube feed was slowed down few days ago and has been upped- on QID bolus feeding volume about the same as home. At home wife reports he does 60 cc/h continuous tube feeding  Anoxic brain injury dependent tracheostomy and PEG tube feeding responds to pain by opening eyes no other response.  Continue supportive care to minimize aspiration, [place].  Continue trach collar and trach suctioning.  Continue home atropine isolation, Pepcid, Robinul 1 mg 3 times daily, Keppra Depakene.  Chronic systolic CHF stable, continue Coreg, last echo from July 2023 EF 30-35%> improved from previous less than 20% from January 2023 Filed Weights   01/07/22 0414 01/08/22 0500 01/10/22 1439  Weight: 93.4 kg 95.2 kg 95.3 kg    DVT on home Eliquis Hypertension BP fairly stable on hydralazine, Coreg BMI 28  DVT prophylaxis: Eliquis Code Status:   Code Status: Full Code Family Communication: plan of care discussed with patient/staffs at bedside.  Updated his spouse over the phone. She is concerned about his  generalized edema, nursing checking with otherwise patient weight was downtrending since 11/3, he does have a hypoalbuminemia that can cause generalized swelling If he has weight gain> can do lasix x 1  Patient status [id number] because of aspiration pneumonia Level  of care: Telemetry Cardiac  Dispo:The patient is from: home           Anticipated disposition:home  Mobility Assessment (last 72 hours)     Mobility Assessment     Row Name 01/10/22 0846 01/09/22 08:15:31 01/08/22 2230 01/08/22 07:41:44 01/08/22 0203   Does patient have an order for bedrest or is patient medically unstable Yes- Bedfast (Level 1) - Complete Yes- Bedfast (Level 1) - Complete Yes- Bedfast (Level 1) - Complete Yes- Bedfast (Level 1) - Complete Yes- Bedfast (Level 1) - Complete    Row Name 01/07/22 2259           Does patient have an order for bedrest or is patient medically unstable Yes- Bedfast (Level 1) - Complete                 Objective: Vitals last 24 hrs: Vitals:   01/10/22 0619 01/10/22 0754 01/10/22 1105 01/10/22 1439  BP:  (!) 149/109 (!) 130/91   Pulse:  75 81   Resp:  17 17   Temp:  97.8 F (36.6 C) 97.7 F (36.5 C)   TempSrc:      SpO2: 98% 100% 100%   Weight:    95.3 kg  Height:       Weight change:   Physical Examination: General exam: Eyes closed open to pain , non verbal, TC +, NO other response.   HEENT:Oral mucosa moist, Ear/Nose WNL grossly Respiratory system: bilaterally diminished BS, TC+ Cardiovascular system: S1 & S2 +, No JVD. Gastrointestinal system: Abdomen soft, peg tube + Nervous System: opens eyes to pain no other meaningful interaction Extremities: LE edema MILD ,distal peripheral pulses palpable.  Skin: No rashes,no icterus. MSK: thin muscle bulk,tone, power  Medications reviewed:  Scheduled Meds:  amoxicillin-clavulanate  500 mg Per Tube TID   apixaban  5 mg Per Tube BID   atropine  2 drop Sublingual QID   carvedilol  25 mg Per Tube BID WC   Chlorhexidine Gluconate Cloth  6 each Topical Daily   clonazePAM  0.5 mg Per Tube BID   dextrose       famotidine  20 mg Per Tube BID   feeding supplement (OSMOLITE 1.5 CAL)  355 mL Per Tube QID   feeding supplement (PROSource TF20)  60 mL Per Tube Daily   fiber  1  packet Per Tube BID   free water  200 mL Per Tube Q6H   glycopyrrolate  1 mg Per Tube TID   hydrALAZINE  50 mg Per Tube TID   levETIRAcetam  750 mg Per Tube BID   metoCLOPramide (REGLAN) injection  10 mg Intravenous TID AC   mouth rinse  15 mL Mouth Rinse 4 times per day   pantoprazole (PROTONIX) IV  40 mg Intravenous Daily   scopolamine  1 patch Transdermal Q72H   sodium chloride flush  3 mL Intravenous Q12H   spironolactone  25 mg Per Tube Daily   valproic acid  750 mg Per Tube TID   Continuous Infusions:    Diet Order             Diet NPO time specified  Diet effective now  Nutrition Problem: Increased nutrient needs Etiology: acute illness Signs/Symptoms: estimated needs Interventions: Tube feeding   Intake/Output Summary (Last 24 hours) at 01/10/2022 1439 Last data filed at 01/10/2022 1200 Gross per 24 hour  Intake 355 ml  Output 400 ml  Net -45 ml   Net IO Since Admission: 1,712.33 mL [01/10/22 1439]  Wt Readings from Last 3 Encounters:  01/10/22 95.3 kg  10/23/21 94.2 kg  10/03/21 98 kg     Unresulted Labs (From admission, onward)     Start     Ordered   01/06/22 0100  Expectorated Sputum Assessment w Gram Stain, Rflx to Resp Cult  Once,   R        01/06/22 0101          Data Reviewed: I have personally reviewed following labs and imaging studies CBC: Recent Labs  Lab 01/05/22 2236 01/09/22 0758 01/10/22 0559  WBC 17.0* 6.1 5.8  NEUTROABS 13.4*  --   --   HGB 12.2* 11.1* 11.9*  HCT 38.5* 34.3* 36.7*  MCV 99.2 95.8 98.4  PLT 158 175 [id number]   Basic Metabolic Panel: Recent Labs  Lab 01/05/22 2236 01/07/22 0440 01/08/22 0509 01/09/22 0758 01/10/22 0559  NA 137 140 139 138 141  K 4.9 4.4 3.8 4.0 4.1  CL 100 106 105 105 105  CO2 29 27 28 28 30   GLUCOSE 124* 73 105* 84 129*  BUN 44* 39* 31* 31* 29*  CREATININE 0.96 0.68 0.68 0.71 0.72  CALCIUM 9.3 9.6 9.4 9.4 9.3  MG  --  1.8 1.9 1.8 1.9  PHOS  --  4.3  --  2.8 2.6    GFR: Estimated Creatinine Clearance: 130.9 mL/min (by C-G formula based on SCr of 0.72 mg/dL). Liver Function Tests: Recent Labs  Lab 01/05/22 2236  AST 31  ALT 18  ALKPHOS 58  BILITOT 0.7  PROT 7.2  ALBUMIN 3.0*   No results for input(s): "LIPASE", "AMYLASE" in the last 168 hours. No results for input(s): "AMMONIA" in the last 168 hours. Coagulation Profile: Recent Labs  Lab 01/05/22 2236 01/06/22 0711  INR 1.2 1.2   BNP (last 3 results) No results for input(s): "PROBNP" in the last 8760 hours. HbA1C: No results for input(s): "HGBA1C" in the last 72 hours. CBG: Recent Labs  Lab 01/10/22 0124 01/10/22 0341 01/10/22 0403 01/10/22 0751 01/10/22 1107  GLUCAP 88 64* 106* 119* 73   Lipid Profile: No results for input(s): "CHOL", "HDL", "LDLCALC", "TRIG", "CHOLHDL", "LDLDIRECT" in the last 72 hours. Thyroid Function Tests: No results for input(s): "TSH", "T4TOTAL", "FREET4", "T3FREE", "THYROIDAB" in the last 72 hours. Sepsis Labs: Recent Labs  Lab 01/05/22 2236 01/06/22 0049 01/06/22 0711  PROCALCITON  --   --  <0.10  LATICACIDVEN 2.5* 2.7* 1.7    Recent Results (from the past 240 hour(s))  Resp Panel by RT-PCR (Flu A&B, Covid) Anterior Nasal Swab     Status: None   Collection Time: 01/05/22 10:36 PM   Specimen: Anterior Nasal Swab  Result Value Ref Range Status   SARS Coronavirus 2 by RT PCR NEGATIVE NEGATIVE Final    Comment: (NOTE) SARS-CoV-2 target nucleic acids are NOT DETECTED.  The SARS-CoV-2 RNA is generally detectable in upper respiratory specimens during the acute phase of infection. The lowest concentration of SARS-CoV-2 viral copies this assay can detect is 138 copies/mL. A negative result does not preclude SARS-Cov-2 infection and should not be used as the sole basis for treatment or other patient management decisions.  A negative result may occur with  improper specimen collection/handling, submission of specimen other than nasopharyngeal  swab, presence of viral mutation(s) within the areas targeted by this assay, and inadequate number of viral copies(<138 copies/mL). A negative result must be combined with clinical observations, patient history, and epidemiological information. The expected result is Negative.  Fact Sheet for Patients:  BloggerCourse.com  Fact Sheet for Healthcare Providers:  SeriousBroker.it  This test is no t yet approved or cleared by the [place] FDA and  has been authorized for detection and/or diagnosis of SARS-CoV-2 by FDA under an Emergency Use Authorization (EUA). This EUA will remain  in effect (meaning this test can be used) for the duration of the COVID-19 declaration under Section 564(b)(1) of the Act, 21 U.S.C.section 360bbb-3(b)(1), unless the authorization is terminated  or revoked sooner.       Influenza A by PCR NEGATIVE NEGATIVE Final   Influenza B by PCR NEGATIVE NEGATIVE Final    Comment: (NOTE) The Xpert Xpress SARS-CoV-2/FLU/RSV plus assay is intended as an aid in the diagnosis of influenza from Nasopharyngeal swab specimens and should not be used as a sole basis for treatment. Nasal washings and aspirates are unacceptable for Xpert Xpress SARS-CoV-2/FLU/RSV testing.  Fact Sheet for Patients: BloggerCourse.com  Fact Sheet for Healthcare Providers: SeriousBroker.it  This test is not yet approved or cleared by the [place] FDA and has been authorized for detection and/or diagnosis of SARS-CoV-2 by FDA under an Emergency Use Authorization (EUA). This EUA will remain in effect (meaning this test can be used) for the duration of the COVID-19 declaration under Section 564(b)(1) of the Act, 21 U.S.C. section 360bbb-3(b)(1), unless the authorization is terminated or revoked.  Performed at [facility], [address] [place], [place] [address]   Blood  Culture (routine x 2)     Status: None   Collection Time: 01/05/22 10:36 PM   Specimen: BLOOD  Result Value Ref Range Status   Specimen Description BLOOD BLOOD LEFT ARM  Final   Special Requests   Final    BOTTLES DRAWN AEROBIC AND ANAEROBIC Blood Culture adequate volume   Culture   Final    NO GROWTH 5 DAYS Performed at [facility], [address] [place], [place] [address]    Report Status 01/10/2022 FINAL  Final  Blood Culture (routine x 2)     Status: Abnormal   Collection Time: 01/05/22 10:36 PM   Specimen: BLOOD  Result Value Ref Range Status   Specimen Description   Final    BLOOD BLOOD RIGHT ARM Performed at [facility], [address], [place], [place] [address]    Special Requests   Final    BOTTLES DRAWN AEROBIC AND ANAEROBIC Blood Culture results may not be optimal due to an inadequate volume of blood received in culture bottles Performed at [facility], [address] [place], [place] [address]    Culture  Setup Time   Final    GRAM POSITIVE COCCI ANAEROBIC BOTTLE ONLY CRITICAL RESULT CALLED TO, READ BACK BY AND VERIFIED WITH: RAQUEL RODRIQUEZ @2113  01/06/22 LFD    Culture (A)  Final    STAPHYLOCOCCUS CAPITIS THE SIGNIFICANCE OF ISOLATING THIS ORGANISM FROM A SINGLE SET OF BLOOD CULTURES WHEN MULTIPLE SETS ARE DRAWN IS UNCERTAIN. PLEASE NOTIFY THE MICROBIOLOGY DEPARTMENT WITHIN ONE WEEK IF SPECIATION AND SENSITIVITIES ARE REQUIRED. Performed at [facility], [address], [place], [place]    Report Status 01/08/2022 FINAL  Final  Urine Culture     Status: Abnormal   Collection Time: 01/05/22 10:36 PM   Specimen: In/Out Cath Urine  Result Value Ref Range Status   Specimen Description   Final    IN/OUT CATH URINE Performed at [facility], [address] [place], [place] [address]    Special Requests   Final    NONE Performed at [facility], [address] [place], [place] [address]     Culture MULTIPLE SPECIES PRESENT, SUGGEST RECOLLECTION (A)  Final   Report Status 01/07/2022 FINAL  Final  Blood Culture ID Panel (Reflexed)     Status: Abnormal   Collection Time: 01/05/22 10:36 PM  Result Value Ref Range Status   Enterococcus faecalis NOT DETECTED NOT DETECTED Final   Enterococcus Faecium NOT DETECTED NOT DETECTED Final   Listeria monocytogenes NOT DETECTED NOT DETECTED Final   Staphylococcus species DETECTED (A) NOT DETECTED Final    Comment: CRITICAL RESULT CALLED TO, READ BACK BY AND VERIFIED WITH: RAQUEL RODRIQUEZ @2113  01/06/22 LFD    Staphylococcus aureus (BCID) NOT DETECTED NOT DETECTED Final   Staphylococcus epidermidis NOT DETECTED NOT DETECTED Final   Staphylococcus lugdunensis NOT DETECTED NOT DETECTED Final   Streptococcus species NOT DETECTED NOT DETECTED Final   Streptococcus agalactiae NOT DETECTED NOT DETECTED Final   Streptococcus pneumoniae NOT DETECTED NOT DETECTED Final   Streptococcus pyogenes NOT DETECTED NOT DETECTED Final   A.calcoaceticus-baumannii NOT DETECTED NOT DETECTED Final   Bacteroides fragilis NOT DETECTED NOT DETECTED Final   Enterobacterales NOT DETECTED NOT DETECTED Final   Enterobacter cloacae complex NOT DETECTED NOT DETECTED Final   Escherichia coli NOT DETECTED NOT DETECTED Final   Klebsiella aerogenes NOT DETECTED NOT DETECTED Final   Klebsiella oxytoca NOT DETECTED NOT DETECTED Final   Klebsiella pneumoniae NOT DETECTED NOT DETECTED Final   Proteus species NOT DETECTED NOT DETECTED Final   Salmonella species NOT DETECTED NOT DETECTED Final   Serratia marcescens NOT DETECTED NOT DETECTED Final   Haemophilus influenzae NOT DETECTED NOT DETECTED Final   Neisseria meningitidis NOT DETECTED NOT DETECTED Final   Pseudomonas aeruginosa NOT DETECTED NOT DETECTED Final   Stenotrophomonas maltophilia NOT DETECTED NOT DETECTED Final   Candida albicans NOT DETECTED NOT DETECTED Final   Candida auris NOT DETECTED NOT DETECTED Final    Candida glabrata NOT DETECTED NOT DETECTED Final   Candida krusei NOT DETECTED NOT DETECTED Final   Candida parapsilosis NOT DETECTED NOT DETECTED Final   Candida tropicalis NOT DETECTED NOT DETECTED Final   Cryptococcus neoformans/gattii NOT DETECTED NOT DETECTED Final    Comment: Performed at [facility], [address] [place], [place] [address]  MRSA Next Gen by PCR, Nasal     Status: None   Collection Time: 01/06/22  1:38 AM   Specimen: Nasal Mucosa; Nasal Swab  Result Value Ref Range Status   MRSA by PCR Next Gen NOT DETECTED NOT DETECTED Final    Comment: (NOTE) The GeneXpert MRSA Assay (FDA approved for NASAL specimens only), is one component of a comprehensive MRSA colonization surveillance program. It is not intended to diagnose MRSA infection nor to guide or monitor treatment for MRSA infections. Test performance is not FDA approved in patients less than 81 years old. Performed at [facility], [address] [place], [place] [address]     Antimicrobials: Anti-infectives (From admission, onward)    Start     Dose/Rate Route Frequency Ordered Stop   01/09/22 1400  amoxicillin-clavulanate (AUGMENTIN)  600-42.9 MG/5ML suspension 500 mg        500 mg Per Tube 3 times daily 01/09/22 1308 01/11/22 0959   01/09/22 1400  metroNIDAZOLE (FLAGYL) 50 mg/ml oral suspension 500 mg  Status:  Discontinued        500 mg Per Tube 2 times daily 01/09/22 1308 01/09/22 1308   01/06/22 2330  vancomycin (VANCOREADY) IVPB 1500 mg/300 mL  Status:  Discontinued        1,500 mg 150 mL/hr over 120 Minutes Intravenous Every 12 hours 01/06/22 2216 01/07/22 1147   01/06/22 1600  metroNIDAZOLE (FLAGYL) IVPB 500 mg  Status:  Discontinued        500 mg 100 mL/hr over 60 Minutes Intravenous Every 12 hours 01/06/22 1427 01/09/22 1307   01/06/22 0800  vancomycin (VANCOREADY) IVPB 1500 mg/300 mL  Status:  Discontinued        1,500 mg 150 mL/hr over 120 Minutes Intravenous Every 12  hours 01/06/22 0127 01/06/22 1422   01/06/22 0600  ceFEPIme (MAXIPIME) 2 g in sodium chloride 0.9 % 100 mL IVPB  Status:  Discontinued        2 g 200 mL/hr over 30 Minutes Intravenous Every 8 hours 01/06/22 0123 01/09/22 1307   01/05/22 2245  vancomycin (VANCOREADY) IVPB 1500 mg/300 mL        1,500 mg 150 mL/hr over 120 Minutes Intravenous  Once 01/05/22 2234 01/06/22 0151   01/05/22 2230  vancomycin (VANCOCIN) IVPB 1000 mg/200 mL premix  Status:  Discontinued        1,000 mg 200 mL/hr over 60 Minutes Intravenous  Once 01/05/22 2229 01/05/22 2234   01/05/22 2230  ceFEPIme (MAXIPIME) 2 g in sodium chloride 0.9 % 100 mL IVPB        2 g 200 mL/hr over 30 Minutes Intravenous  Once 01/05/22 2229 01/06/22 0021   01/05/22 2230  azithromycin (ZITHROMAX) 500 mg in sodium chloride 0.9 % 250 mL IVPB        500 mg 250 mL/hr over 60 Minutes Intravenous  Once 01/05/22 2229 01/06/22 0053      Culture/Microbiology    Component Value Date/Time   SDES BLOOD BLOOD LEFT ARM 01/05/2022 2236   SDES  01/05/2022 2236    BLOOD BLOOD RIGHT ARM Performed at [facility], [address] [place], [place] [address]    SDES  01/05/2022 2236    IN/OUT CATH URINE Performed at [facility], [address] [place], [place] [address]    [facility]  01/05/2022 2236    BOTTLES DRAWN AEROBIC AND ANAEROBIC Blood Culture adequate volume   SPECREQUEST  01/05/2022 2236    BOTTLES DRAWN AEROBIC AND ANAEROBIC Blood Culture results may not be optimal due to an inadequate volume of blood received in culture bottles Performed at [facility], [address] [place], [place] [address]    [facility]  01/05/2022 2236    NONE Performed at [facility], [address] [place], [place] [address]    [place]  01/05/2022 2236    NO GROWTH 5 DAYS Performed at [facility], [address] [place], [place] [address]    CULT (A) 01/05/2022 2236    STAPHYLOCOCCUS CAPITIS THE  SIGNIFICANCE OF ISOLATING THIS ORGANISM FROM A SINGLE SET OF BLOOD CULTURES WHEN MULTIPLE SETS ARE DRAWN IS UNCERTAIN. PLEASE NOTIFY THE MICROBIOLOGY DEPARTMENT WITHIN ONE WEEK IF SPECIATION AND SENSITIVITIES ARE REQUIRED. Performed at [facility], [address], [place],  [address]  CULT MULTIPLE SPECIES PRESENT, SUGGEST RECOLLECTION (A) 01/05/2022 2236   REPTSTATUS 01/10/2022 FINAL 01/05/2022 2236   REPTSTATUS 01/08/2022 FINAL 01/05/2022 2236   REPTSTATUS 01/07/2022 FINAL 01/05/2022 2236  Other culture-see note  Radiology Studies: No results found.   LOS: 4 days   Antonieta Pert, MD [facility]  01/10/2022, 2:39 PM

## 2022-01-11 DIAGNOSIS — A419 Sepsis, unspecified organism: Secondary | ICD-10-CM | POA: Diagnosis not present

## 2022-01-11 DIAGNOSIS — R6520 Severe sepsis without septic shock: Secondary | ICD-10-CM | POA: Diagnosis not present

## 2022-01-11 LAB — GLUCOSE, CAPILLARY
Glucose-Capillary: 100 mg/dL — ABNORMAL HIGH (ref 70–99)
Glucose-Capillary: 117 mg/dL — ABNORMAL HIGH (ref 70–99)
Glucose-Capillary: 121 mg/dL — ABNORMAL HIGH (ref 70–99)
Glucose-Capillary: 136 mg/dL — ABNORMAL HIGH (ref 70–99)
Glucose-Capillary: 69 mg/dL — ABNORMAL LOW (ref 70–99)
Glucose-Capillary: 73 mg/dL (ref 70–99)

## 2022-01-11 MED ORDER — FUROSEMIDE 10 MG/ML IJ SOLN
20.0000 mg | Freq: Once | INTRAMUSCULAR | Status: AC
  Administered 2022-01-11: 20 mg via INTRAVENOUS
  Filled 2022-01-11: qty 2

## 2022-01-11 NOTE — Plan of Care (Signed)

## 2022-01-11 NOTE — Progress Notes (Signed)
PROGRESS NOTE Billy Cherry  JJO:[id number] DOB: 1970/08/01 DOA: 01/05/2022 PCP: [facility]   Brief Narrative/Hospital Course: 51 y.o. male with medical history significant for anoxic brain injury and persistent vegetative state status post tracheostomy and PEG tube placement, chronic systolic CHF, hypertension, DVT on chronic anticoagulation, who presents with fever and tachycardia.  Patient had a thick secretion from trach tube, chest x-ray showed bilateral lower lobe atelectasis.  Patient also had a lactic acid level of 2.7. Admitted for sepsis likely from aspiration pneumonia placed on antibiotics cefepime, Flagyl.   Patient developed significant nausea vomiting on 11/4, KUB did not see any acute changes.  Started on Reglan.  Patient wife also had GI symptoms, it appears that patient had gastroenteritis, causing nausea, subsequently caused aspiration pneumonia. Patient antibiotic was switched to Augmentin on 11/7.  Blood culture with Staph capitis in single single set likely contaminatio    Subjective: Seen and examined this morning Nursing doing trach suction-having a lot of oral secretion Alert awake, able to track with eyes Nonverbal no other response Blood sugar 69 this morning> improved after his tube feeding given Mild swelling in extremities  Assessment and Plan: Principal Problem:   Severe sepsis ([name]) Active Problems:   Anoxic brain injury ([name])   DVT (deep venous thrombosis) ([name])   HTN (hypertension)   Chronic systolic CHF (congestive heart failure) ([name])   Tracheostomy dependent ([name])   S/P percutaneous endoscopic gastrostomy (PEG) tube placement ([name])   Aspiration pneumonia of both lower lobes ([name])   Gastroenteritis  Severe sepsis due to aspiration pneumonia Aspiration pneumonia: Overall patient is hemodynamically stable, doing well on trach collar WBC count is stable.  Remains on bolus tube feeding, Augmentin.Blood culture BC ID with Staph capitis  likely contamination from single site.  Continues to have excessive oral secretion, continue scopolamine patch, Robinul> trach care.  Will give dose of Lasix as he is bit swollen too.  Per respiratory therapy/pulmonology patient to remain on trach collar with deflated cuff, wife wants to learn more about trach management and coming today. Recent Labs  Lab 01/05/22 2236 01/06/22 0049 01/06/22 0711 01/09/22 0758 01/10/22 0559  WBC 17.0*  --   --  6.1 5.8  LATICACIDVEN 2.5* 2.7* 1.7  --   --   PROCALCITON  --   --  <0.10  --   --    Acute gastroenteritis: w/ loose BM.  No longer vomiting.  Continue bolus tube feeding.  At home on continues tube feeding, continue supportive care.  Dietitian following. Borderline blood sugar level: Improved with bolus tube feeding dietitian on board continue tube feeding,monitor cbg Recent Labs  Lab 01/10/22 2053 01/11/22 0036 01/11/22 0411 01/11/22 0733 01/11/22 1040  GLUCAP 128* 121* 100* 69* 136*    Anoxic brain injury  and dependent on Trach/Peg, able to track but nonverbal. Continue trach collar and trach suctioning, tube feeding.  Continue home atropine eye drops, Pepcid, Keppra Depakene.  Chronic systolic CHF stable, continue Coreg, last echo from July 2023 EF 30-35%> improved from previous less than 20% from January 2023.  Appears to be swollen here with increasing weight giving IV Lasix x1 toady. Good uop after po lasix yesterday Filed Weights   01/08/22 0500 01/10/22 1439 01/11/22 0409  Weight: 95.2 kg 95.3 kg 96.4 kg    DVT/PE hx: cont  home Eliquis Hypertension BP fairly stable cont  hydralazine, Coreg  DVT prophylaxis: Eliquis Code Status:   Code Status: Full Code Family Communication: plan of care discussed with  patient/staffs at bedside. Patient wife was updated over the phone 11/8. Patient status [id number] because of aspiration pneumonia Level of care: Telemetry Cardiac  Dispo:The patient is from: home           Anticipated  disposition:home ~2 days  Mobility Assessment (last 72 hours)     Mobility Assessment     Row Name 01/10/22 0846 01/09/22 08:15:31 01/08/22 2230       Does patient have an order for bedrest or is patient medically unstable Yes- Bedfast (Level 1) - Complete Yes- Bedfast (Level 1) - Complete Yes- Bedfast (Level 1) - Complete               Objective: Vitals last 24 hrs: Vitals:   01/11/22 0735 01/11/22 0800 01/11/22 1136 01/11/22 1137  BP: (!) 162/120 (!) 162/120 128/88   Pulse: 84 80 85   Resp: 18 20 18    Temp: 97.9 F (36.6 C) 98 F (36.7 C) 98.4 F (36.9 C)   TempSrc: Axillary Axillary Axillary   SpO2: 99% 98% 98% 98%  Weight:      Height:       Weight change:   Physical Examination: General exam: Alert awake nonverbal eyes open able to track and no other response.   HEENT:Oral mucosa moist, Ear/Nose WNL grossly, dentition normal.  TC present Respiratory system: bilaterally mild basal crackles, excessive oral secretion, trach collar in place  Cardiovascular system: S1 & S2 +, regular rate. Gastrointestinal system: Abdomen soft, peg+,ND,BS+ Nervous System:Alert, awake, nonverbal able to track but no other response  Extremities: LE ankle edema mild, lower extremities warm Skin: No rashes,no icterus. MSK: Normal muscle bulk,tone, power   Medications reviewed:  Scheduled Meds:  apixaban  5 mg Per Tube BID   atropine  2 drop Sublingual QID   carvedilol  25 mg Per Tube BID WC   Chlorhexidine Gluconate Cloth  6 each Topical Daily   clonazePAM  0.5 mg Per Tube BID   famotidine  20 mg Per Tube BID   feeding supplement (OSMOLITE 1.5 CAL)  355 mL Per Tube QID   feeding supplement (PROSource TF20)  60 mL Per Tube Daily   fiber  1 packet Per Tube BID   free water  200 mL Per Tube Q6H   furosemide  20 mg Intravenous Once   glycopyrrolate  1 mg Per Tube TID   hydrALAZINE  50 mg Per Tube TID   levETIRAcetam  750 mg Per Tube BID   metoCLOPramide (REGLAN) injection  10 mg  Intravenous TID AC   mouth rinse  15 mL Mouth Rinse 4 times per day   pantoprazole (PROTONIX) IV  40 mg Intravenous Daily   scopolamine  1 patch Transdermal Q72H   sodium chloride flush  3 mL Intravenous Q12H   spironolactone  25 mg Per Tube Daily   valproic acid  750 mg Per Tube TID   Continuous Infusions:    Diet Order             Diet NPO time specified  Diet effective now                    Nutrition Problem: Increased nutrient needs Etiology: acute illness Signs/Symptoms: estimated needs Interventions: Tube feeding   Intake/Output Summary (Last 24 hours) at 01/11/2022 1145 Last data filed at 01/11/2022 0836 Gross per 24 hour  Intake 3135 ml  Output 2850 ml  Net 285 ml    Net IO Since Admission: 2,397.33 mL [01/11/22  1145]  Wt Readings from Last 3 Encounters:  01/11/22 96.4 kg  10/23/21 94.2 kg  10/03/21 98 kg     Unresulted Labs (From admission, onward)     Start     Ordered   01/06/22 0100  Expectorated Sputum Assessment w Gram Stain, Rflx to Resp Cult  Once,   R        01/06/22 0101          Data Reviewed: I have personally reviewed following labs and imaging studies CBC: Recent Labs  Lab 01/05/22 2236 01/09/22 0758 01/10/22 0559  WBC 17.0* 6.1 5.8  NEUTROABS 13.4*  --   --   HGB 12.2* 11.1* 11.9*  HCT 38.5* 34.3* 36.7*  MCV 99.2 95.8 98.4  PLT 158 175 190    Basic Metabolic Panel: Recent Labs  Lab 01/05/22 2236 01/07/22 0440 01/08/22 0509 01/09/22 0758 01/10/22 0559  NA 137 140 139 138 141  K 4.9 4.4 3.8 4.0 4.1  CL 100 106 105 105 105  CO2 GLUCOSE 124* 73 105* 84 129*  BUN 44* 39* 31* 31* 29*  CREATININE 0.96 0.68 0.68 0.71 0.72  CALCIUM 9.3 9.6 9.4 9.4 9.3  MG  --  1.8 1.9 1.8 1.9  PHOS  --  4.3  --  2.8 2.6    GFR: Estimated Creatinine Clearance: 131.5 mL/min (by C-G formula based on SCr of 0.72 mg/dL). Liver Function Tests: Recent Labs  Lab 01/05/22 2236  AST 31  ALT 18  ALKPHOS 58  BILITOT 0.7   PROT 7.2  ALBUMIN 3.0*   Coagulation Profile: Recent Labs  Lab 01/05/22 2236 01/06/22 0049 01/06/22 0711  PROCALCITON  --   --  <0.10  LATICACIDVEN 2.5* 2.7* 1.7    Recent Results (from the past 240 hour(s))  Resp Panel by RT-PCR (Flu A&B, Covid) Anterior Nasal Swab     Status: None   Collection Time: 01/05/22 10:36 PM   Specimen: Anterior Nasal Swab  Result Value Ref Range Status   SARS Coronavirus 2 by RT PCR NEGATIVE NEGATIVE Final    Comment: (NOTE) SARS-CoV-2 target nucleic acids are NOT DETECTED.  The SARS-CoV-2 RNA is generally detectable in upper respiratory specimens during the acute phase of infection. The lowest concentration of SARS-CoV-2 viral copies this assay can detect is 138 copies/mL. A negative result does not preclude SARS-Cov-2 infection and should not be used as the sole basis for treatment or other patient management decisions. A negative result may occur with  improper specimen collection/handling, submission of specimen other than nasopharyngeal swab, presence of viral mutation(s) within the areas targeted by this assay, and inadequate number of viral copies(<138 copies/mL). A negative result must be combined with clinical observations, patient history, and epidemiological information. The expected result is Negative.  Fact Sheet for Patients:  BloggerCourse.com  Fact Sheet for Healthcare Providers:  SeriousBroker.it  This test is no t yet approved or cleared by the [place] FDA and  has been authorized for detection and/or diagnosis of SARS-CoV-2 by FDA under an Emergency Use Authorization (EUA). This EUA will remain  in effect (meaning this test can be used) for the duration of the COVID-19 declaration under Section 564(b)(1) of the Act, 21 U.S.C.section 360bbb-3(b)(1), unless the authorization is terminated  or revoked sooner.       Influenza A by PCR NEGATIVE NEGATIVE Final    Influenza B by PCR NEGATIVE NEGATIVE Final    Comment: (NOTE) The Xpert Xpress SARS-CoV-2/FLU/RSV  plus assay is intended as an aid in the diagnosis of influenza from Nasopharyngeal swab specimens and should not be used as a sole basis for treatment. Nasal washings and aspirates are unacceptable for Xpert Xpress SARS-CoV-2/FLU/RSV testing.  Fact Sheet for Patients: BloggerCourse.com  Fact Sheet for Healthcare Providers: SeriousBroker.it  This test is not yet approved or cleared by the [place] FDA and has been authorized for detection and/or diagnosis of SARS-CoV-2 by FDA under an Emergency Use Authorization (EUA). This EUA will remain in effect (meaning this test can be used) for the duration of the COVID-19 declaration under Section 564(b)(1) of the Act, 21 U.S.C. section 360bbb-3(b)(1), unless the authorization is terminated or revoked.  Performed at [facility], [address] [place], [place] [address]   Blood Culture (routine x 2)     Status: None   Collection Time: 01/05/22 10:36 PM   Specimen: BLOOD  Result Value Ref Range Status   Specimen Description BLOOD BLOOD LEFT ARM  Final   Special Requests   Final    BOTTLES DRAWN AEROBIC AND ANAEROBIC Blood Culture adequate volume   Culture   Final    NO GROWTH 5 DAYS Performed at [facility], [address], [place], [place] [address]    Report Status 01/10/2022 FINAL  Final  Blood Culture (routine x 2)     Status: Abnormal   Collection Time: 01/05/22 10:36 PM   Specimen: BLOOD  Result Value Ref Range Status   Specimen Description   Final    BLOOD BLOOD RIGHT ARM Performed at [facility], [address] [place], [place] [address]    Special Requests   Final    BOTTLES DRAWN AEROBIC AND ANAEROBIC Blood Culture results may not be optimal due to an inadequate volume of blood received in culture bottles Performed at [facility], [address] [place], [place] [address]    Culture  Setup Time   Final    GRAM POSITIVE COCCI ANAEROBIC BOTTLE ONLY CRITICAL RESULT CALLED TO, READ BACK BY AND VERIFIED WITH: RAQUEL RODRIQUEZ @2113  01/06/22 LFD    Culture (A)  Final    STAPHYLOCOCCUS CAPITIS THE SIGNIFICANCE OF ISOLATING THIS ORGANISM FROM A SINGLE SET OF BLOOD CULTURES WHEN MULTIPLE SETS ARE DRAWN IS UNCERTAIN. PLEASE NOTIFY THE MICROBIOLOGY DEPARTMENT WITHIN ONE WEEK IF SPECIATION AND SENSITIVITIES ARE REQUIRED. Performed at [facility], [address] [place], [place]    Report Status 01/08/2022 FINAL  Final  Urine Culture     Status: Abnormal   Collection Time: 01/05/22 10:36 PM   Specimen: In/Out Cath Urine  Result Value Ref Range Status   Specimen Description   Final    IN/OUT CATH URINE Performed at [facility], [address] [place], [place]    Special Requests   Final    NONE Performed at [facility], [address] [place], [place]    Culture MULTIPLE SPECIES PRESENT, SUGGEST RECOLLECTION (A)  Final   Report Status 01/07/2022 FINAL  Final  Blood Culture ID Panel (Reflexed)     Status: Abnormal   Collection Time: 01/05/22 10:36 PM  Result Value Ref Range Status   Enterococcus faecalis NOT DETECTED NOT DETECTED Final   Enterococcus Faecium NOT DETECTED NOT DETECTED Final   Listeria monocytogenes NOT DETECTED NOT DETECTED Final   Staphylococcus species DETECTED (A) NOT DETECTED Final    Comment: CRITICAL RESULT CALLED TO, READ BACK BY AND VERIFIED  WITH: RAQUEL RODRIQUEZ @2113  01/06/22 LFD    Staphylococcus aureus (BCID) NOT DETECTED NOT DETECTED Final   Staphylococcus epidermidis NOT DETECTED NOT DETECTED Final   Staphylococcus lugdunensis NOT DETECTED NOT DETECTED Final   Streptococcus species NOT DETECTED NOT DETECTED Final   Streptococcus agalactiae NOT DETECTED NOT DETECTED Final   Streptococcus pneumoniae NOT DETECTED NOT  DETECTED Final   Streptococcus pyogenes NOT DETECTED NOT DETECTED Final   A.calcoaceticus-baumannii NOT DETECTED NOT DETECTED Final   Bacteroides fragilis NOT DETECTED NOT DETECTED Final   Enterobacterales NOT DETECTED NOT DETECTED Final   Enterobacter cloacae complex NOT DETECTED NOT DETECTED Final   Escherichia coli NOT DETECTED NOT DETECTED Final   Klebsiella aerogenes NOT DETECTED NOT DETECTED Final   Klebsiella oxytoca NOT DETECTED NOT DETECTED Final   Klebsiella pneumoniae NOT DETECTED NOT DETECTED Final   Proteus species NOT DETECTED NOT DETECTED Final   Salmonella species NOT DETECTED NOT DETECTED Final   Serratia marcescens NOT DETECTED NOT DETECTED Final   Haemophilus influenzae NOT DETECTED NOT DETECTED Final   Neisseria meningitidis NOT DETECTED NOT DETECTED Final   Pseudomonas aeruginosa NOT DETECTED NOT DETECTED Final   Stenotrophomonas maltophilia NOT DETECTED NOT DETECTED Final   Candida albicans NOT DETECTED NOT DETECTED Final   Candida auris NOT DETECTED NOT DETECTED Final   Candida glabrata NOT DETECTED NOT DETECTED Final   Candida krusei NOT DETECTED NOT DETECTED Final   Candida parapsilosis NOT DETECTED NOT DETECTED Final   Candida tropicalis NOT DETECTED NOT DETECTED Final   Cryptococcus neoformans/gattii NOT DETECTED NOT DETECTED Final    Comment: Performed at [facility], [address] [place], [place]  MRSA Next Gen by PCR, Nasal     Status: None   Collection Time: 01/06/22  1:38 AM   Specimen: Nasal Mucosa; Nasal Swab  Result Value Ref Range Status   MRSA by PCR Next Gen NOT DETECTED NOT DETECTED Final    Comment: (NOTE) The GeneXpert MRSA Assay (FDA approved for NASAL specimens only), is one component of a comprehensive MRSA colonization surveillance program. It is not intended to diagnose MRSA infection nor to guide or monitor treatment for MRSA infections. Test performance is not FDA approved in patients less than 68  years old. Performed at [facility], [address] [place], [place]     Antimicrobials: Anti-infectives (From admission, onward)    Start     Dose/Rate Route Frequency Ordered Stop   01/09/22 1400  amoxicillin-clavulanate (AUGMENTIN) 600-42.9 MG/5ML suspension 500 mg        500 mg Per Tube 3 times daily 01/09/22 1308 01/10/22 2156   01/09/22 1400  metroNIDAZOLE (FLAGYL) 50 mg/ml oral suspension 500 mg  Status:  Discontinued        500 mg Per Tube 2 times daily 01/09/22 1308 01/09/22 1308   01/06/22 2330  vancomycin (VANCOREADY) IVPB 1500 mg/300 mL  Status:  Discontinued        1,500 mg 150 mL/hr over 120 Minutes Intravenous Every 12 hours 01/06/22 2216 01/07/22 1147   01/06/22 1600  metroNIDAZOLE (FLAGYL) IVPB 500 mg  Status:  Discontinued        500 mg 100 mL/hr over 60 Minutes Intravenous Every 12 hours 01/06/22 1427 01/09/22 1307   01/06/22 0800  vancomycin (VANCOREADY) IVPB 1500 mg/300 mL  Status:  Discontinued        1,500 mg 150 mL/hr over 120 Minutes Intravenous Every 12 hours 01/06/22 0127 01/06/22 1422   01/06/22 0600  ceFEPIme (MAXIPIME) 2 g in sodium chloride 0.9 % 100 mL IVPB  Status:  Discontinued        2 g 200 mL/hr over 30 Minutes Intravenous Every 8 hours 01/06/22 0123 01/09/22 1307   01/05/22 2245  vancomycin (VANCOREADY) IVPB 1500 mg/300 mL        1,500 mg 150 mL/hr over 120 Minutes Intravenous  Once 01/05/22 2234 01/06/22 0151   01/05/22 2230  vancomycin (VANCOCIN) IVPB 1000 mg/200 mL premix  Status:  Discontinued        1,000 mg 200 mL/hr over 60 Minutes Intravenous  Once 01/05/22 2229 01/05/22 2234   01/05/22 2230  ceFEPIme (MAXIPIME) 2 g in sodium chloride 0.9 % 100 mL IVPB        2 g 200 mL/hr over 30 Minutes Intravenous  Once 01/05/22 2229 01/06/22 0021   01/05/22 2230  azithromycin (ZITHROMAX) 500 mg in sodium chloride 0.9 % 250 mL IVPB        500 mg 250 mL/hr over 60 Minutes Intravenous  Once 01/05/22 2229 01/06/22 0053       Culture/Microbiology    Component Value Date/Time   SDES BLOOD BLOOD LEFT ARM 01/05/2022 2236   SDES  01/05/2022 2236    BLOOD BLOOD RIGHT ARM Performed at [facility], [address] [place], [place] [address]    SDES  01/05/2022 2236    IN/OUT CATH URINE Performed at [facility], [address] [place], [place] [address]    [facility]  01/05[facility]2022 2236    BOTTLES DRAWN AEROBIC AND ANAEROBIC Blood Culture adequate volume   SPECREQUEST  01/05/2022 2236    BOTTLES DRAWN AEROBIC AND ANAEROBIC Blood Culture results may not be optimal due to an inadequate volume of blood received in culture bottles Performed at [facility], [address] [place], [place] [address]    [facility]  01/05/2022 2236    NONE Performed at [facility], [address] [place], [place] [address]    CULT  01/05/2022 2236    NO GROWTH 5 DAYS Performed at [facility], [address] [place], [place] [address]    CULT (A) 01/05/2022 2236    STAPHYLOCOCCUS CAPITIS THE SIGNIFICANCE OF ISOLATING THIS ORGANISM FROM A SINGLE SET OF BLOOD CULTURES WHEN MULTIPLE SETS ARE DRAWN IS UNCERTAIN. PLEASE NOTIFY THE MICROBIOLOGY DEPARTMENT WITHIN ONE WEEK IF SPECIATION AND SENSITIVITIES ARE REQUIRED. Performed at [facility], [address] [place], [place] [address]    CULT MULTIPLE SPECIES PRESENT, SUGGEST RECOLLECTION (A) 01/05/2022 2236   REPTSTATUS 01/10/2022 FINAL 01/05/2022 2236   REPTSTATUS 01/08/2022 FINAL 01/05/2022 2236   REPTSTATUS 01/07/2022 FINAL 01/05/2022 2236  Other culture-see note  Radiology Studies: No results found.   LOS: 5 days   Lanae Boastamesh Shakayla Hickox, MD [facility]  01/11/2022, 11:45 AM

## 2022-01-12 DIAGNOSIS — A419 Sepsis, unspecified organism: Secondary | ICD-10-CM | POA: Diagnosis not present

## 2022-01-12 DIAGNOSIS — R6520 Severe sepsis without septic shock: Secondary | ICD-10-CM | POA: Diagnosis not present

## 2022-01-12 LAB — GLUCOSE, CAPILLARY
Glucose-Capillary: 103 mg/dL — ABNORMAL HIGH (ref 70–99)
Glucose-Capillary: 106 mg/dL — ABNORMAL HIGH (ref 70–99)
Glucose-Capillary: 116 mg/dL — ABNORMAL HIGH (ref 70–99)
Glucose-Capillary: 120 mg/dL — ABNORMAL HIGH (ref 70–99)
Glucose-Capillary: 124 mg/dL — ABNORMAL HIGH (ref 70–99)
Glucose-Capillary: 76 mg/dL (ref 70–99)
Glucose-Capillary: 97 mg/dL (ref 70–99)

## 2022-01-12 MED ORDER — METOCLOPRAMIDE HCL 5 MG/ML IJ SOLN
10.0000 mg | Freq: Four times a day (QID) | INTRAMUSCULAR | Status: DC | PRN
  Administered 2022-01-19: 10 mg via INTRAVENOUS
  Filled 2022-01-12: qty 2

## 2022-01-12 MED ORDER — CARVEDILOL 3.125 MG PO TABS
3.1250 mg | ORAL_TABLET | Freq: Two times a day (BID) | ORAL | Status: DC
  Administered 2022-01-12 – 2022-01-23 (×22): 3.125 mg
  Filled 2022-01-12 (×21): qty 1

## 2022-01-12 MED ORDER — CARVEDILOL 6.25 MG PO TABS: 6.2500 mg | ORAL_TABLET | Freq: Two times a day (BID) | ORAL | Status: DC

## 2022-01-12 MED ORDER — FUROSEMIDE 10 MG/ML IJ SOLN
40.0000 mg | Freq: Every day | INTRAMUSCULAR | Status: DC
  Administered 2022-01-13 – 2022-01-15 (×3): 40 mg via INTRAVENOUS
  Filled 2022-01-12 (×3): qty 4

## 2022-01-12 MED ORDER — AMOXICILLIN-POT CLAVULANATE 600-42.9 MG/5ML PO SUSR
500.0000 mg | Freq: Three times a day (TID) | ORAL | Status: AC
  Administered 2022-01-12 – 2022-01-14 (×4): 500 mg
  Filled 2022-01-12 (×2): qty 4.17
  Filled 2022-01-12: qty 4.2
  Filled 2022-01-12: qty 4.17
  Filled 2022-01-12 (×2): qty 4.2

## 2022-01-12 NOTE — Plan of Care (Signed)

## 2022-01-12 NOTE — Consult Note (Signed)
PULMONOLOGY         Date: 01/12/2022,   MRN# [id number] Billy Cherry 1971/03/05     AdmissionWeight: 108.9 kg                 CurrentWeight: 91.9 kg (one pillow, 1 blanket and 1 top sheet)  Referring provider: Lanae Boast   CHIEF COMPLAINT:   Acute on chronic hypoxemic respiratory failure    HISTORY OF PRESENT ILLNESS   Billy Cherry is a 51 y.o. male with medical history significant for anoxic brain injury and persistent vegetative state status post tracheostomy and PEG tube placement, chronic systolic CHF, hypertension, DVT on chronic anticoagulation, who presents with fever and tachycardia.Patient was brought in from home due to fever, tachycardia, and thick secretions from his tracheostomy tube.  No additional history given. Patient was febrile on arrival with tachypnea and tacchycardia and hypoxemia.  CBC notable for white count of 17, mild anemia with hemoglobin 12.2, CMP was unremarkable.  Lactic acid was elevated at 2.5, chest x-ray showed low lung volumes and no obvious infiltrates.  Blood and urine cultures were obtained.  Respiratory viral panel was obtained.  He was given a dose of azithromycin, cefepime, vancomycin, 1 L NS bolus, and admitted for further management. He had CT chest abd and pelvis in June 2023 with findings of pulmonary interstitial edema bilaterally at moderate severity, he had CXR on arrival with findings of poor inspiratory effort, mild pulmonary edema.    PAST MEDICAL HISTORY   Past Medical History:  Diagnosis Date  . Hypertension      SURGICAL HISTORY   Past Surgical History:  Procedure Laterality Date  . BACK SURGERY    . CORONARY/GRAFT ACUTE MI REVASCULARIZATION N/A 03/23/2021   Procedure: Coronary/Graft Acute MI Revascularization;  Surgeon: Marcina Millard, MD;  Location: [facility];  Service: Cardiovascular;  Laterality: N/A;  . IR GASTROSTOMY TUBE MOD SED  04/26/2021  . LEFT HEART CATH AND CORONARY ANGIOGRAPHY  N/A 03/23/2021   Procedure: LEFT HEART CATH AND CORONARY ANGIOGRAPHY;  Surgeon: Marcina Millard, MD;  Location: [facility];  Service: Cardiovascular;  Laterality: N/A;  . SHOULDER SURGERY       FAMILY HISTORY   No family history on file.   SOCIAL HISTORY   Social History   Tobacco Use  . Smoking status: Never  . Smokeless tobacco: Never  Vaping Use  . Vaping Use: Unknown  Substance Use Topics  . Alcohol use: Yes  . Drug use: Never     MEDICATIONS    Home Medication:    Current Medication:  Current Facility-Administered Medications:  .  acetaminophen (TYLENOL) tablet 650 mg, 650 mg, Oral, Q6H PRN **OR** acetaminophen (TYLENOL) suppository 650 mg, 650 mg, Rectal, Q6H PRN, Venora Maples, MD .  albuterol (PROVENTIL) (2.5 MG/3ML) 0.083% nebulizer solution 2.5 mg, 2.5 mg, Nebulization, Q6H PRN, Venora Maples, MD .  amoxicillin-clavulanate (AUGMENTIN) 600-42.9 MG/5ML suspension 500 mg, 500 mg, Per Tube, TID, Kc, Ramesh, MD .  apixaban (ELIQUIS) tablet 5 mg, 5 mg, Per Tube, BID, Venora Maples, MD, 5 mg at 01/12/22 1000 .  atropine 1 % ophthalmic solution 2 drop, 2 drop, Sublingual, QID, Venora Maples, MD, 2 drop at 01/12/22 1002 .  carvedilol (COREG) tablet 25 mg, 25 mg, Per Tube, BID WC, Marrion Coy, MD, 25 mg at 01/12/22 4270 .  Chlorhexidine Gluconate Cloth 2 % PADS 6 each, 6 each, Topical, Daily, Marrion Coy, MD, 6 each at  01/12/22 1002 .  clonazePAM (KLONOPIN) tablet 0.5 mg, 0.5 mg, Per Tube, BID, Clarnce Flock, MD, 0.5 mg at 01/12/22 1000 .  famotidine (PEPCID) tablet 20 mg, 20 mg, Per Tube, BID, Clarnce Flock, MD, 20 mg at 01/12/22 1000 .  feeding supplement (OSMOLITE 1.5 CAL) liquid 355 mL, 355 mL, Per Tube, QID, Sharen Hones, MD, 355 mL at 01/12/22 1224 .  feeding supplement (PROSource TF20) liquid 60 mL, 60 mL, Per Tube, Daily, Sharen Hones, MD, 60 mL at 01/12/22 1225 .  fiber (NUTRISOURCE FIBER) 1 packet, 1 packet, Per  Tube, BID, Clarnce Flock, MD, 1 packet at 01/12/22 1001 .  free water 200 mL, 200 mL, Per Tube, Q6H, Sharen Hones, MD, 200 mL at 01/12/22 1224 .  glycopyrrolate (ROBINUL) tablet 1 mg, 1 mg, Per Tube, TID, Clarnce Flock, MD, 1 mg at 01/12/22 1001 .  hydrALAZINE (APRESOLINE) tablet 50 mg, 50 mg, Per Tube, TID, Clarnce Flock, MD, 50 mg at 01/12/22 1001 .  ipratropium-albuterol (DUONEB) 0.5-2.5 (3) MG/3ML nebulizer solution 3 mL, 3 mL, Nebulization, Q4H PRN, Clarnce Flock, MD .  levETIRAcetam (KEPPRA) 100 MG/ML solution 750 mg, 750 mg, Per Tube, BID, Clarnce Flock, MD, 750 mg at 01/12/22 1000 .  loperamide HCl (IMODIUM) 1 MG/7.5ML suspension 2 mg, 2 mg, Per Tube, Daily PRN, Clarnce Flock, MD .  metoCLOPramide (REGLAN) injection 10 mg, 10 mg, Intravenous, TID Arta Bruce, MD, 10 mg at 01/12/22 1224 .  ondansetron (ZOFRAN) tablet 4 mg, 4 mg, Oral, Q6H PRN **OR** ondansetron (ZOFRAN) injection 4 mg, 4 mg, Intravenous, Q6H PRN, Clarnce Flock, MD, 4 mg at 01/07/22 1530 .  Oral care mouth rinse, 15 mL, Mouth Rinse, 4 times per day, Sharen Hones, MD, 15 mL at 01/12/22 1224 .  Oral care mouth rinse, 15 mL, Mouth Rinse, PRN, Sharen Hones, MD .  oxyCODONE (Oxy IR/ROXICODONE) immediate release tablet 5 mg, 5 mg, Oral, Q4H PRN, Clarnce Flock, MD .  pantoprazole (PROTONIX) injection 40 mg, 40 mg, Intravenous, Daily, Lorna Dibble, RPH, 40 mg at 01/12/22 1001 .  polyethylene glycol (MIRALAX / GLYCOLAX) packet 17 g, 17 g, Oral, Daily PRN, Clarnce Flock, MD .  scopolamine (TRANSDERM-SCOP) 1 MG/3DAYS 1.5 mg, 1 patch, Transdermal, Q72H, Clarnce Flock, MD, 1.5 mg at 01/12/22 1001 .  sodium chloride flush (NS) 0.9 % injection 3 mL, 3 mL, Intravenous, Q12H, Clarnce Flock, MD, 3 mL at 01/12/22 1002 .  spironolactone (ALDACTONE) tablet 25 mg, 25 mg, Per Tube, Daily, Kc, Ramesh, MD, 25 mg at 01/12/22 1000 .  traZODone (DESYREL) tablet 50 mg, 50 mg, Oral, QHS PRN,  Clarnce Flock, MD .  valproic acid (DEPAKENE) 250 MG/5ML solution 750 mg, 750 mg, Per Tube, TID, Clarnce Flock, MD, 750 mg at 01/12/22 1001    ALLERGIES   Zestril [lisinopril]     REVIEW OF SYSTEMS    Review of Systems:  Gen:  Denies  fever, sweats, chills weigh loss  HEENT: Denies blurred vision, double vision, ear pain, eye pain, hearing loss, nose bleeds, sore throat Cardiac:  No dizziness, chest pain or heaviness, chest tightness,edema Resp:   reports dyspnea chronically  Gi: Denies swallowing difficulty, stomach pain, nausea or vomiting, diarrhea, constipation, bowel incontinence Gu:  Denies bladder incontinence, burning urine Ext:   Denies Joint pain, stiffness or swelling Skin: Denies  skin rash, easy bruising or bleeding or hives Endoc:  Denies polyuria, polydipsia , polyphagia or  weight change Psych:   Denies depression, insomnia or hallucinations   Other:  All other systems negative   VS: BP 117/81   Pulse 78   Temp 98.4 F (36.9 C) (Oral)   Resp 16   Ht 6' (1.829 m)   Wt 91.9 kg Comment: one pillow, 1 blanket and 1 top sheet  SpO2 100%   BMI 27.48 kg/m      PHYSICAL EXAM    GENERAL:NAD, no fevers, chills, no weakness no fatigue HEAD: Normocephalic, atraumatic.  EYES: Pupils equal, round, reactive to light. Extraocular muscles intact. No scleral icterus.  MOUTH: Moist mucosal membrane. Dentition intact. No abscess noted.  EAR, NOSE, THROAT: Clear without exudates. No external lesions. , +trache NECK: Supple. No thyromegaly. No nodules. No JVD.  PULMONARY: decreased breath sounds with mild rhonchi worse at bases bilaterally.  CARDIOVASCULAR: S1 and S2. Regular rate and rhythm. No murmurs, rubs, or gallops. No edema. Pedal pulses 2+ bilaterally.  GASTROINTESTINAL: Soft, nontender, nondistended. No masses. Positive bowel sounds. No hepatosplenomegaly.  MUSCULOSKELETAL: No swelling, clubbing, or edema. Range of motion full in all extremities.   NEUROLOGIC: Cranial nerves II through XII are intact. No gross focal neurological deficits. Sensation intact. Reflexes intact.  SKIN: No ulceration, lesions, rashes, or cyanosis. Skin warm and dry. Turgor intact.  PSYCHIATRIC: Mood, affect within normal limits. The patient is awake, alert and oriented x 3. Insight, judgment intact.       IMAGING     ASSESSMENT/PLAN   Acute on chronic hypoxemic respiratory failure -  due to possible tracheitis  -he has been on agumentin for aspiration  - his blood cultures have + staph epidermitis - he has not been found to have respiratory infection  -his TTE shows severe systolic CHF with EF [id number] -patient on scopolamine, oxycodone and rubinol -tracheal aspirate today to see if its infected and get sensitivity panel - will diurese due to previous pulmonary congestion which may be part of this  - will consider bronchoscopy tommorow to be done at bedside      Thank you for allowing me to participate in the care of this patient.   Patient/Family are satisfied with care plan and all questions have been answered.    Provider disclosure: Patient with at least one acute or chronic illness or injury that poses a threat to life or bodily function and is being managed actively during this encounter.  All of the below services have been performed independently by signing provider:  review of prior documentation from internal and or external health records.  Review of previous and current lab results.  Interview and comprehensive assessment during patient visit today. Review of current and previous chest radiographs/CT scans. Discussion of management and test interpretation with health care team and patient/family.   This document was prepared using Dragon voice recognition software and may include unintentional dictation errors.     Ottie Glazier, M.D.  Division of Pulmonary & Critical Care Medicine

## 2022-01-12 NOTE — Progress Notes (Signed)
PROGRESS NOTE Billy Cherry  [id number] DOB: 1970-05-02 DOA: 01/05/2022 PCP: [facility]   Brief Narrative/Hospital Course: 51 y.o. male with medical history significant for anoxic brain injury and persistent vegetative state status post tracheostomy and PEG tube placement, chronic systolic CHF, hypertension, DVT on chronic anticoagulation, who presents with fever and tachycardia.  Patient had a thick secretion from trach tube, chest x-ray showed bilateral lower lobe atelectasis.  Patient also had a lactic acid level of 2.7. Admitted for sepsis likely from aspiration pneumonia placed on antibiotics cefepime, Flagyl.   Patient developed significant nausea vomiting on 11/4, KUB did not see any acute changes.  Started on Reglan.  Patient wife also had GI symptoms, it appears that patient had gastroenteritis, causing nausea, subsequently caused aspiration pneumonia. Patient antibiotic was switched to Augmentin on 11/7.  Blood culture with Staph capitis in single single set likely contamination Patient continued with full supportive care trach care respiratory care. Patient given few days of IV Lasix swelling has improved weight has returned to his baseline. Overall remains stable given his overall status and always at risk of aspiration/infection/pneumonia bedsores. Advise continued follow-up with his pulmonary team at [facility]    Subjective: Seen and examined this morning  No acute blood sugar remains stable without hypoglycemia  Remains on trach collar. Swelling has gotten better.  Assessment and Plan: Principal Problem:   Severe sepsis ([place]) Active Problems:   Anoxic brain injury ([place])   DVT (deep venous thrombosis) ([name])   HTN (hypertension)   Chronic systolic CHF (congestive heart failure) ([name])   Tracheostomy dependent ([name])   S/P percutaneous endoscopic gastrostomy (PEG) tube placement ([name])   Aspiration pneumonia of both lower lobes ([name])   Gastroenteritis  Severe  sepsis due to aspiration pneumonia Aspiration pneumonia: Overall remains stable afebrile, WC count stable.  Tolerating tube feeding without nausea or vomiting.  Continue Augmentin.Blood culture BC ID with Staph capitis likely contamination from single site.  Continues to have oral secretion> continue with ongoing respiratory care/tracheal suctioning along with Scopolamine patch, Robinul tid.  S/p Lasixx2 with improvement.Per respiratory therapy/pulmonology patient to remain on trach collar with deflated cuff, wife wants to learn more about trach management. Recent Labs  Lab 01/05/22 2236 01/06/22 0049 01/06/22 0711 01/09/22 0758 01/10/22 0559  WBC 17.0*  --   --  6.1 5.8  LATICACIDVEN 2.5* 2.7* 1.7  --   --   PROCALCITON  --   --  <0.10  --   --    Acute gastroenteritis: w/ loose BM.  Tolerating tube feeding no nausea vomiting.Continue bolus tube feeding.  At home on continues tube feeding, continue supportive care.  Dietitian following. Borderline blood sugar level: Blood sugar at this time is stable, continue tube feeding.  Recent Labs  Lab 01/11/22 2057 01/12/22 0034 01/12/22 0407 01/12/22 0756 01/12/22 1218  GLUCAP 73 116* 103* 76 106*     Anoxic brain injury  and dependent on Trach/Peg, able to track but nonverbal. Continue trach collar and trach suctioning, tube feeding.  Continue home atropine eye drops, Pepcid, Keppra Depakene.  Chronic systolic CHF stable, continue Coreg, last echo from July 2023 EF 30-35%> improved from previous less than 20% from January 2023.  Patient needed few doses of Lasix weight has improved to baseline.  No leg edema.  Filed Weights   01/10/22 1439 01/11/22 0409 01/12/22 0404  Weight: 95.3 kg 96.4 kg 91.9 kg    DVT/PE hx: cont  home Eliquis Hypertension BP fairly stable cont  hydralazine,  Coreg  DVT prophylaxis: Eliquis Code Status:   Code Status: Full Code Family Communication: plan of care discussed with patient/staffs at bedside. Patient  wife was updated over the phone 11/8. Attempted to reach patient's wife unable to get on the phone.  Nursing staffs trying.  Patient status [id number] because of aspiration  Level of care: Telemetry Cardiac   Dispo:The patient is from: home           Anticipated disposition:home over the weekend  Mobility Assessment (last 72 hours)     Mobility Assessment     Row Name 01/10/22 0846           Does patient have an order for bedrest or is patient medically unstable Yes- Bedfast (Level 1) - Complete                 Objective: Vitals last 24 hrs: Vitals:   01/12/22 0341 01/12/22 0404 01/12/22 0755 01/12/22 1219  BP: (!) 160/105  (!) 170/127 117/81  Pulse: 78  83 78  Resp: 20  12 16   Temp: 98.3 F (36.8 C)  98.3 F (36.8 C) 98.4 F (36.9 C)  TempSrc:    Oral  SpO2: 100%  100% 100%  Weight:  91.9 kg    Height:       Weight change: -3.4 kg  Physical Examination: General exam: Alert awake able to track nonverbal no other response.  HEENT:Oral mucosa moist, Ear/Nose WNL grossly, dentition normal. Respiratory system: bilaterally diminished BS,TC+ Cardiovascular system: S1 & S2 +, regular rate. Gastrointestinal system: Abdomen soft, peg+,ND,BS+ Nervous System:Alert, awake, nonverbal able to track no other response Extremities: LE ankle edema minimal, lower extremities warm Skin: No rashes,no icterus. MSK: Normal muscle bulk,tone, power   Medications reviewed:  Scheduled Meds:  apixaban  5 mg Per Tube BID   atropine  2 drop Sublingual QID   carvedilol  25 mg Per Tube BID WC   Chlorhexidine Gluconate Cloth  6 each Topical Daily   clonazePAM  0.5 mg Per Tube BID   famotidine  20 mg Per Tube BID   feeding supplement (OSMOLITE 1.5 CAL)  355 mL Per Tube QID   feeding supplement (PROSource TF20)  60 mL Per Tube Daily   fiber  1 packet Per Tube BID   free water  200 mL Per Tube Q6H   glycopyrrolate  1 mg Per Tube TID   hydrALAZINE  50 mg Per Tube TID   levETIRAcetam   750 mg Per Tube BID   metoCLOPramide (REGLAN) injection  10 mg Intravenous TID AC   mouth rinse  15 mL Mouth Rinse 4 times per day   pantoprazole (PROTONIX) IV  40 mg Intravenous Daily   scopolamine  1 patch Transdermal Q72H   sodium chloride flush  3 mL Intravenous Q12H   spironolactone  25 mg Per Tube Daily   valproic acid  750 mg Per Tube TID   Continuous Infusions:    Diet Order             Diet NPO time specified  Diet effective now                    Nutrition Problem: Increased nutrient needs Etiology: acute illness Signs/Symptoms: estimated needs Interventions: Tube feeding   Intake/Output Summary (Last 24 hours) at 01/12/2022 1352 Last data filed at 01/12/2022 0404 Gross per 24 hour  Intake --  Output 2550 ml  Net -2550 ml    Net IO Since Admission: -  152.67 mL [01/12/22 1352]  Wt Readings from Last 3 Encounters:  01/12/22 91.9 kg  10/23/21 94.2 kg  10/03/21 98 kg     Unresulted Labs (From admission, onward)     Start     Ordered   01/13/22 [id number]  Basic metabolic panel  Tomorrow morning,   R       Question:  Specimen collection method  Answer:  Lab=Lab collect   01/12/22 0758   01/13/22 0500  CBC  Tomorrow morning,   R       Question:  Specimen collection method  Answer:  Lab=Lab collect   01/12/22 0758   01/06/22 0100  Expectorated Sputum Assessment w Gram Stain, Rflx to Resp Cult  Once,   R        01/06/22 0101          Data Reviewed: I have personally reviewed following labs and imaging studies CBC: Recent Labs  Lab 01/05/22 2236 01/09/22 0758 01/10/22 0559  WBC 17.0* 6.1 5.8  NEUTROABS 13.4*  --   --   HGB 12.2* 11.1* 11.9*  HCT 38.5* 34.3* 36.7*  MCV 99.2 95.8 98.4  PLT 158 175 [id number]    Basic Metabolic Panel: Recent Labs  Lab 01/05/22 2236 01/07/22 0440 01/08/22 0509 01/09/22 0758 01/10/22 0559  NA 137 140 139 138 141  K 4.9 4.4 3.8 4.0 4.1  CL 100 106 105 105 105  CO2 29 27 28 28 30   GLUCOSE 124* 73 105* 84 129*  BUN  44* 39* 31* 31* 29*  CREATININE 0.96 0.68 0.68 0.71 0.72  CALCIUM 9.3 9.6 9.4 9.4 9.3  MG  --  1.8 1.9 1.8 1.9  PHOS  --  4.3  --  2.8 2.6    GFR: Estimated Creatinine Clearance: 119.9 mL/min (by C-G formula based on SCr of 0.72 mg/dL). Liver Function Tests: Recent Labs  Lab 01/05/22 2236  AST 31  ALT 18  ALKPHOS 58  BILITOT 0.7  PROT 7.2  ALBUMIN 3.0*   Coagulation Profile: Recent Labs  Lab 01/05/22 2236 01/06/22 0049 01/06/22 0711  PROCALCITON  --   --  <0.10  LATICACIDVEN 2.5* 2.7* 1.7    Recent Results (from the past 240 hour(s))  Resp Panel by RT-PCR (Flu A&B, Covid) Anterior Nasal Swab     Status: None   Collection Time: 01/05/22 10:36 PM   Specimen: Anterior Nasal Swab  Result Value Ref Range Status   SARS Coronavirus 2 by RT PCR NEGATIVE NEGATIVE Final    Comment: (NOTE) SARS-CoV-2 target nucleic acids are NOT DETECTED.  The SARS-CoV-2 RNA is generally detectable in upper respiratory specimens during the acute phase of infection. The lowest concentration of SARS-CoV-2 viral copies this assay can detect is 138 copies/mL. A negative result does not preclude SARS-Cov-2 infection and should not be used as the sole basis for treatment or other patient management decisions. A negative result may occur with  improper specimen collection/handling, submission of specimen other than nasopharyngeal swab, presence of viral mutation(s) within the areas targeted by this assay, and inadequate number of viral copies(<138 copies/mL). A negative result must be combined with clinical observations, patient history, and epidemiological information. The expected result is Negative.  Fact Sheet for Patients:  EntrepreneurPulse.com.au  Fact Sheet for Healthcare Providers:  IncredibleEmployment.be  This test is no t yet approved or cleared by the [place] FDA and  has been authorized for detection and/or diagnosis of SARS-CoV-2 by FDA  under an Emergency Use Authorization (EUA).  This EUA will remain  in effect (meaning this test can be used) for the duration of the COVID-19 declaration under Section 564(b)(1) of the Act, 21 U.S.C.section 360bbb-3(b)(1), unless the authorization is terminated  or revoked sooner.       Influenza A by PCR NEGATIVE NEGATIVE Final   Influenza B by PCR NEGATIVE NEGATIVE Final    Comment: (NOTE) The Xpert Xpress SARS-CoV-2/FLU/RSV plus assay is intended as an aid in the diagnosis of influenza from Nasopharyngeal swab specimens and should not be used as a sole basis for treatment. Nasal washings and aspirates are unacceptable for Xpert Xpress SARS-CoV-2/FLU/RSV testing.  Fact Sheet for Patients: EntrepreneurPulse.com.au  Fact Sheet for Healthcare Providers: IncredibleEmployment.be  This test is not yet approved or cleared by the [place] FDA and has been authorized for detection and/or diagnosis of SARS-CoV-2 by FDA under an Emergency Use Authorization (EUA). This EUA will remain in effect (meaning this test can be used) for the duration of the COVID-19 declaration under Section 564(b)(1) of the Act, 21 U.S.C. section 360bbb-3(b)(1), unless the authorization is terminated or revoked.  Performed at [facility], [address] [place], [place] [address]   Blood Culture (routine x 2)     Status: None   Collection Time: 01/05/22 10:36 PM   Specimen: BLOOD  Result Value Ref Range Status   Specimen Description BLOOD BLOOD LEFT ARM  Final   Special Requests   Final    BOTTLES DRAWN AEROBIC AND ANAEROBIC Blood Culture adequate volume   Culture   Final    NO GROWTH 5 DAYS Performed at [facility], [address] [place], [place] [address]    Report Status 01/10/2022 FINAL  Final  Blood Culture (routine x 2)     Status: Abnormal   Collection Time: 01/05/22 10:36 PM   Specimen: BLOOD  Result Value Ref Range Status    Specimen Description   Final    BLOOD BLOOD RIGHT ARM Performed at [facility], [address] [place], [place] [address]    Special Requests   Final    BOTTLES DRAWN AEROBIC AND ANAEROBIC Blood Culture results may not be optimal due to an inadequate volume of blood received in culture bottles Performed at [facility], [address] [place], [place] [address]    Culture  Setup Time   Final    GRAM POSITIVE COCCI ANAEROBIC BOTTLE ONLY CRITICAL RESULT CALLED TO, READ BACK BY AND VERIFIED WITH: RAQUEL RODRIQUEZ @2113  01/06/22 LFD    Culture (A)  Final    STAPHYLOCOCCUS CAPITIS THE SIGNIFICANCE OF ISOLATING THIS ORGANISM FROM A SINGLE SET OF BLOOD CULTURES WHEN MULTIPLE SETS ARE DRAWN IS UNCERTAIN. PLEASE NOTIFY THE MICROBIOLOGY DEPARTMENT WITHIN ONE WEEK IF SPECIATION AND SENSITIVITIES ARE REQUIRED. Performed at [facility], [address], [place], [place] [address]    Report Status 01/08/2022 FINAL  Final  Urine Culture     Status: Abnormal   Collection Time: 01/05/22 10:36 PM   Specimen: In/Out Cath Urine  Result Value Ref Range Status   Specimen Description   Final    IN/OUT CATH URINE Performed at [facility], [address], [place], [place] [address]    Special Requests   Final    NONE Performed at [facility], [address] [place], [place] [address]    Culture MULTIPLE SPECIES PRESENT, SUGGEST RECOLLECTION (A)  Final   Report Status 01/07/2022 FINAL  Final  Blood Culture ID Panel (  Reflexed)     Status: Abnormal   Collection Time: 01/05/22 10:36 PM  Result Value Ref Range Status   Enterococcus faecalis NOT DETECTED NOT DETECTED Final   Enterococcus Faecium NOT DETECTED NOT DETECTED Final   Listeria monocytogenes NOT DETECTED NOT DETECTED Final   Staphylococcus species DETECTED (A) NOT DETECTED Final    Comment: CRITICAL RESULT CALLED TO, READ BACK BY AND VERIFIED WITH: RAQUEL RODRIQUEZ @2113  01/06/22 LFD     Staphylococcus aureus (BCID) NOT DETECTED NOT DETECTED Final   Staphylococcus epidermidis NOT DETECTED NOT DETECTED Final   Staphylococcus lugdunensis NOT DETECTED NOT DETECTED Final   Streptococcus species NOT DETECTED NOT DETECTED Final   Streptococcus agalactiae NOT DETECTED NOT DETECTED Final   Streptococcus pneumoniae NOT DETECTED NOT DETECTED Final   Streptococcus pyogenes NOT DETECTED NOT DETECTED Final   A.calcoaceticus-baumannii NOT DETECTED NOT DETECTED Final   Bacteroides fragilis NOT DETECTED NOT DETECTED Final   Enterobacterales NOT DETECTED NOT DETECTED Final   Enterobacter cloacae complex NOT DETECTED NOT DETECTED Final   Escherichia coli NOT DETECTED NOT DETECTED Final   Klebsiella aerogenes NOT DETECTED NOT DETECTED Final   Klebsiella oxytoca NOT DETECTED NOT DETECTED Final   Klebsiella pneumoniae NOT DETECTED NOT DETECTED Final   Proteus species NOT DETECTED NOT DETECTED Final   Salmonella species NOT DETECTED NOT DETECTED Final   Serratia marcescens NOT DETECTED NOT DETECTED Final   Haemophilus influenzae NOT DETECTED NOT DETECTED Final   Neisseria meningitidis NOT DETECTED NOT DETECTED Final   Pseudomonas aeruginosa NOT DETECTED NOT DETECTED Final   Stenotrophomonas maltophilia NOT DETECTED NOT DETECTED Final   Candida albicans NOT DETECTED NOT DETECTED Final   Candida auris NOT DETECTED NOT DETECTED Final   Candida glabrata NOT DETECTED NOT DETECTED Final   Candida krusei NOT DETECTED NOT DETECTED Final   Candida parapsilosis NOT DETECTED NOT DETECTED Final   Candida tropicalis NOT DETECTED NOT DETECTED Final   Cryptococcus neoformans/gattii NOT DETECTED NOT DETECTED Final    Comment: Performed at [facility], [address] [place], [place]  MRSA Next Gen by PCR, Nasal     Status: None   Collection Time: 01/06/22  1:38 AM   Specimen: Nasal Mucosa; Nasal Swab  Result Value Ref Range Status   MRSA by PCR Next Gen NOT DETECTED NOT DETECTED  Final    Comment: (NOTE) The GeneXpert MRSA Assay (FDA approved for NASAL specimens only), is one component of a comprehensive MRSA colonization surveillance program. It is not intended to diagnose MRSA infection nor to guide or monitor treatment for MRSA infections. Test performance is not FDA approved in patients less than 23 years old. Performed at [facility], [address] [place], [place]     Antimicrobials: Anti-infectives (From admission, onward)    Start     Dose/Rate Route Frequency Ordered Stop   01/09/22 1400  amoxicillin-clavulanate (AUGMENTIN) 600-42.9 MG/5ML suspension 500 mg        500 mg Per Tube 3 times daily 01/09/22 1308 01/10/22 2156   01/09/22 1400  metroNIDAZOLE (FLAGYL) 50 mg/ml oral suspension 500 mg  Status:  Discontinued        500 mg Per Tube 2 times daily 01/09/22 1308 01/09/22 1308   01/06/22 2330  vancomycin (VANCOREADY) IVPB 1500 mg/300 mL  Status:  Discontinued        1,500 mg 150 mL/hr over 120 Minutes Intravenous Every 12 hours 01/06/22 2216 01/07/22 1147   01/06/22 1600  metroNIDAZOLE (FLAGYL) IVPB  500 mg  Status:  Discontinued        500 mg 100 mL/hr over 60 Minutes Intravenous Every 12 hours 01/06/22 1427 01/09/22 1307   01/06/22 0800  vancomycin (VANCOREADY) IVPB 1500 mg/300 mL  Status:  Discontinued        1,500 mg 150 mL/hr over 120 Minutes Intravenous Every 12 hours 01/06/22 0127 01/06/22 1422   01/06/22 0600  ceFEPIme (MAXIPIME) 2 g in sodium chloride 0.9 % 100 mL IVPB  Status:  Discontinued        2 g 200 mL/hr over 30 Minutes Intravenous Every 8 hours 01/06/22 0123 01/09/22 1307   01/05/22 2245  vancomycin (VANCOREADY) IVPB 1500 mg/300 mL        1,500 mg 150 mL/hr over 120 Minutes Intravenous  Once 01/05/22 2234 01/06/22 0151   01/05/22 2230  vancomycin (VANCOCIN) IVPB 1000 mg/200 mL premix  Status:  Discontinued        1,000 mg 200 mL/hr over 60 Minutes Intravenous  Once 01/05/22 2229 01/05/22 2234   01/05/22  2230  ceFEPIme (MAXIPIME) 2 g in sodium chloride 0.9 % 100 mL IVPB        2 g 200 mL/hr over 30 Minutes Intravenous  Once 01/05/22 2229 01/06/22 0021   01/05/22 2230  azithromycin (ZITHROMAX) 500 mg in sodium chloride 0.9 % 250 mL IVPB        500 mg 250 mL/hr over 60 Minutes Intravenous  Once 01/05/22 2229 01/06/22 0053      Culture/Microbiology    Component Value Date/Time   SDES BLOOD BLOOD LEFT ARM 01/05/2022 2236   SDES  01/05/2022 2236    BLOOD BLOOD RIGHT ARM Performed at [facility], [address] [place], [place] [address]    SDES  01/05/2022 2236    IN/OUT CATH URINE Performed at [facility], [address] [place], [place] [address]    [facility]  01/05/2022 2236    BOTTLES DRAWN AEROBIC AND ANAEROBIC Blood Culture adequate volume   SPECREQUEST  01/05/2022 2236    BOTTLES DRAWN AEROBIC AND ANAEROBIC Blood Culture results may not be optimal due to an inadequate volume of blood received in culture bottles Performed at [facility], [address] [place], [place] [address]    [facility]  01/05/2022 2236    NONE Performed at [facility], [address] [place], [place] [address]    CULT  01/05/2022 2236    NO GROWTH 5 DAYS Performed at [facility], [address] [place], [place] [address]    CULT (A) 01/05/2022 2236    STAPHYLOCOCCUS CAPITIS THE SIGNIFICANCE OF ISOLATING THIS ORGANISM FROM A SINGLE SET OF BLOOD CULTURES WHEN MULTIPLE SETS ARE DRAWN IS UNCERTAIN. PLEASE NOTIFY THE MICROBIOLOGY DEPARTMENT WITHIN ONE WEEK IF SPECIATION AND SENSITIVITIES ARE REQUIRED. Performed at [facility], [place] [address] [place], [place] [address]    CULT MULTIPLE SPECIES PRESENT, SUGGEST RECOLLECTION (A) 01/05/2022 2236   REPTSTATUS 01/10/2022 FINAL 01/05/2022 2236   REPTSTATUS 01/08/2022 FINAL 01/05/2022 2236   REPTSTATUS 01/07/2022 FINAL 01/05/2022 2236  Other culture-see note  Radiology Studies: No results  found.   LOS: 6 days   Antonieta Pert, MD [facility]  01/12/2022, 1:52 PM

## 2022-01-13 DIAGNOSIS — R6520 Severe sepsis without septic shock: Secondary | ICD-10-CM | POA: Diagnosis not present

## 2022-01-13 DIAGNOSIS — A419 Sepsis, unspecified organism: Secondary | ICD-10-CM | POA: Diagnosis not present

## 2022-01-13 LAB — BASIC METABOLIC PANEL
Anion gap: 6 (ref 5–15)
BUN: 25 mg/dL — ABNORMAL HIGH (ref 6–20)
CO2: 32 mmol/L (ref 22–32)
Calcium: 9.5 mg/dL (ref 8.9–10.3)
Chloride: 100 mmol/L (ref 98–111)
Creatinine, Ser: 0.74 mg/dL (ref 0.61–1.24)
GFR, Estimated: >60 mL/min (ref >60)
Glucose, Bld: 78 mg/dL (ref 70–99)
Potassium: 5 mmol/L (ref 3.5–5.1)
Sodium: 138 mmol/L (ref 135–145)

## 2022-01-13 LAB — GLUCOSE, CAPILLARY
Glucose-Capillary: 104 mg/dL — ABNORMAL HIGH (ref 70–99)
Glucose-Capillary: 133 mg/dL — ABNORMAL HIGH (ref 70–99)
Glucose-Capillary: 72 mg/dL (ref 70–99)
Glucose-Capillary: 75 mg/dL (ref 70–99)
Glucose-Capillary: 77 mg/dL (ref 70–99)
Glucose-Capillary: 89 mg/dL (ref 70–99)
Glucose-Capillary: 98 mg/dL (ref 70–99)

## 2022-01-13 LAB — CBC
HCT: 37.8 % — ABNORMAL LOW (ref 39.0–52.0)
Hemoglobin: 12.1 g/dL — ABNORMAL LOW (ref 13.0–17.0)
MCH: 31.3 pg (ref 26.0–34.0)
MCHC: 32 g/dL (ref 30.0–36.0)
MCV: 97.7 fL (ref 80.0–100.0)
Platelets: 225 10*3/uL (ref 150–400)
RBC: 3.87 MIL/uL — ABNORMAL LOW (ref 4.22–5.81)
RDW: 13.6 % (ref 11.5–15.5)
WBC: 8.4 10*3/uL (ref 4.0–10.5)
nRBC: 0 % (ref 0.0–0.2)

## 2022-01-13 MED ORDER — MIDAZOLAM HCL 2 MG/2ML IJ SOLN
2.0000 mg | Freq: Once | INTRAMUSCULAR | Status: AC
  Administered 2022-01-13 (×2): 1 mg via INTRAVENOUS

## 2022-01-13 MED ORDER — GUAIFENESIN 100 MG/5ML PO LIQD
5.0000 mL | ORAL | Status: DC | PRN
  Filled 2022-01-13: qty 10

## 2022-01-13 MED ORDER — METOPROLOL TARTRATE 5 MG/5ML IV SOLN: 5.0000 mg | INTRAVENOUS | Status: DC | PRN

## 2022-01-13 MED ORDER — HYDRALAZINE HCL 20 MG/ML IJ SOLN: 10.0000 mg | INTRAMUSCULAR | Status: DC | PRN

## 2022-01-13 MED ORDER — MIDAZOLAM HCL 2 MG/2ML IJ SOLN
INTRAMUSCULAR | Status: AC
  Filled 2022-01-13: qty 2

## 2022-01-13 NOTE — Progress Notes (Signed)
Tracheal suctioned for moderate amt of thick tan secretions. Tol well.

## 2022-01-13 NOTE — Procedures (Addendum)
PROCEDURE: BRONCHOSCOPY Therapeutic Aspiration of Tracheobronchial Tree and BAL  PROCEDURE DATE: 01/13/2022  TIME:  NAME:  Billy Cherry  DOB:06-22-1970  MRN: [id number] LOC:  253A/253A-AA    HOSP DAY: _0 @ CODE STATUS:      Code Status Orders  (From admission, onward)           Start     Ordered   01/06/22 0059  Full code  Continuous        01/06/22 0101           Code Status History     Date Active Date Inactive Code Status Order ID Comments User Context   10/04/2021 2133 10/24/2021 1844 Full Code [id number]  Lenore Cordia, MD ED   03/30/2021 1635 10/04/2021 1547 Full Code [id number]  Estill Cotta, NP Inpatient   03/23/2021 0410 03/30/2021 1625 Full Code [id number]  Isaias Cowman, MD ED           Indications/Preliminary Diagnosis:   Consent: (Place X beside choice/s below)  The benefits, risks and possible complications of the procedure were        explained to:  ___ patient  ___ patient's family  ___ other:___________  who verbalized understanding and gave:  ___ verbal  ___ written  ___ verbal and written  ___ telephone  ___ other:________ consent.    x  Unable to obtain consent; procedure performed on emergent basis.     Other:    I called contact of patient for consent, [name] - no answer and [name] no answer.  Will proceed with bronchoscopy due to worsening clinical status and respiratory distress.    PRESEDATION ASSESSMENT: History and Physical has been performed. Patient meds and allergies have been reviewed. Presedation airway examination has been performed and documented. Baseline vital signs, sedation score, oxygenation status, and cardiac rhythm were reviewed. Patient was deemed to be in satisfactory condition to undergo the procedure.    PREMEDICATIONS:   Sedative/Narcotic Amt Dose   Versed 1 mg   Fentanyl  mcg  Diprivan  mg            PROCEDURE DETAILS: Timeout performed and  correct patient, name, & ID confirmed. Following prep per Pulmonary policy, appropriate sedation was administered. The Bronchoscope was inserted in to oral cavity with bite block in place. Therapeutic aspiration of Tracheobronchial tree was performed.  Airway exam proceeded with findings, technical procedures, and specimen collection as noted below. At the end of exam the scope was withdrawn without incident. Impression and Plan as noted below.           Airway Prep (Place X beside choice below)   1% Transtracheal Lidocaine Anesthetization 7 cc   Patient prepped per Bronchoscopy Lab Policy       Insertion Route (Place X beside choice below)   Nasal   Oral  x Endotracheal Tube   Tracheostomy   INTRAPROCEDURE MEDICATIONS:  Sedative/Narcotic Amt Dose   Versed 1 mg   Fentanyl  mcg  Diprivan  mg       Medication Amt Dose  Medication Amt Dose  Lidocaine 1%  cc  Epinephrine 1:10,000 sol  cc  Xylocaine 4%  cc  Cocaine  cc   TECHNICAL PROCEDURES: (Place X beside choice below)   Procedures  Description    None     Electrocautery     Cryotherapy     Balloon Dilatation     Bronchography     Stent Placement  x  Therapeutic Aspiration bilaterally    Laser/Argon Plasma    Brachytherapy Catheter Placement    Foreign Body Removal         SPECIMENS (Sites): (Place X beside choice below)  Specimens Description   No Specimens Obtained     Washings   x Lavage At left lower lobe   Biopsies    Fine Needle Aspirates    Brushings    Sputum    FINDINGS:  ESTIMATED BLOOD LOSS: none COMPLICATIONS/RESOLUTION: none      IMPRESSION:POST-PROCEDURE DX:   Edematous lungs with mucopurulent secretions bilaterally worse on left and worst at LLL.   RECOMMENDATION/PLAN:      Ottie Glazier, M.D.  Pulmonary & [place]

## 2022-01-13 NOTE — Progress Notes (Addendum)
Dr. Karna Christmas at bedside with RT freda and [place]. Per md give 1mg  versed prior to procedure/bronchoscopy. Patient hooked up to monitor and continuous pulse ox

## 2022-01-13 NOTE — Progress Notes (Signed)
Bronchoscopy complete, cultures sent per md. Inner cannula changed by RT, trach care performed and dressing changed per RT. Per Dr Karna Christmas patient is okay to start back on bolus feed and medications.

## 2022-01-13 NOTE — Progress Notes (Addendum)
Patient tolerating procedure. Per Dr. Debbe Bales give additional 1mg  versed via iv.  Sat 95% on trach collar, heart rate 95

## 2022-01-13 NOTE — Progress Notes (Signed)
PROGRESS NOTE    Billy GuerinWilliam Liberati  WGN:[id number] DOB: [birth year] DOA: 01/05/2022 PCP: [facility]   Brief Narrative:  51 y.o. male with medical history significant for anoxic brain injury and persistent vegetative state status post tracheostomy and PEG tube placement, chronic systolic CHF, hypertension, DVT on chronic anticoagulation, who presents with fever and tachycardia.  Patient had a thick secretion from trach tube, chest x-ray showed bilateral lower lobe atelectasis.  Patient also had a lactic acid level of 2.7. Admitted for sepsis likely from aspiration pneumonia placed on antibiotics cefepime, Flagyl.  Patient developed significant nausea vomiting on 11/4, KUB did not see any acute changes.  Started on Reglan.  Patient wife also had GI symptoms, it appears that patient had gastroenteritis, causing nausea, subsequently caused aspiration pneumonia. Patient antibiotic was switched to Augmentin on 11/7.  Blood culture with Staph capitis in single single set likely contamination. Patient continued with full supportive care trach care respiratory care. Patient given few days of IV Lasix swelling has improved weight has returned to his baseline. Overall remains stable given his overall status and always at risk of aspiration/infection/pneumonia bedsores. Advise continued [facility] Pulm team consulted for increase in tracheal secretions.    Assessment & Plan:  Principal Problem:   Severe sepsis ([name]) Active Problems:   Anoxic brain injury ([name])   DVT (deep venous thrombosis) ([name])   HTN (hypertension)   Chronic systolic CHF (congestive heart failure) ([name])   Tracheostomy dependent ([name])   S/P percutaneous endoscopic gastrostomy (PEG) tube placement ([name])   Aspiration pneumonia of both lower lobes ([name])   Gastroenteritis    Severe sepsis due to aspiration pneumonia Aspiration pneumonia Trachitis with increase tracheal secretions.  Bcx had grown  Stap Capitis treated with Augmentin; continue for now. Still concerns of infection, therefore tracheal secretions ordered. Plans for Bronchoscope per pulm. Pulm Hygiene. Remains on trach collar.   Pulmonary congestion Acute systolic CHF, ef 78%35% -Diuretics to help with pulm decongestion. On Lasix 40mg  iv daily. Supportive care.  Cont Coreg.   Acute gastroenteritis: w/ loose BM.  Tolerating tube feeding no nausea vomiting.Continue bolus tube feeding.  At home on continues tube feeding, continue supportive care.  Dietitian following.  Borderline blood sugar level: Blood sugar at this time is stable, continue tube feeding.   Anoxic brain injury  and dependent on Trach/Peg, able to track but nonverbal. Continue trach collar and trach suctioning, tube feeding.  Continue home atropine eye drops, Pepcid, Keppra Depakene.  DVT/PE hx: cont  home Eliquis  Hypertension BP fairly stable cont  hydralazine, Coreg     DVT prophylaxis: SCDs Code Status: Full  Family Communication:    Status is: Inpatient Remains inpatient appropriate because: On going evaluation for tracheal secretions.    Nutritional status    Signs/Symptoms: estimated needs  Interventions: Tube feeding  Body mass index is 28.2 kg/m.         Subjective: Nonverbal.  Still having quite a bit of secretions.    Examination:  General exam: Appears calm and comfortable  Respiratory system: mild ant rhonchi.  Cardiovascular system: S1 & S2 heard, RRR. No JVD, murmurs, rubs, gallops or clicks. No pedal edema. Gastrointestinal system: Abd is soft Central nervous system: Unable to assess.  Extremities: Trace LE edema.  Skin: No rashes, lesions or ulcers Psychiatry: unable to assess  Trahc and peg in place.   Objective: Vitals:   01/13/22 0402 01/13/22 0500 01/13/22 0753 01/13/22 0851  BP: Marland Kitchen(!)  137/96  (!) 136/109   Pulse: 79  80 85  Resp: Temp: 98.8 F (37.1 C)  98.2 F (36.8 C)   TempSrc:       SpO2: 94%  100% 99%  Weight:  94.3 kg    Height:        Intake/Output Summary (Last 24 hours) at 01/13/2022 0904 Last data filed at 01/13/2022 0416 Gross per 24 hour  Intake --  Output 1850 ml  Net -1850 ml   Filed Weights   01/11/22 0409 01/12/22 0404 01/13/22 0500  Weight: 96.4 kg 91.9 kg 94.3 kg     Data Reviewed:   CBC: Recent Labs  Lab 01/09/22 0758 01/10/22 0559 01/13/22 0633  WBC 6.1 5.8 8.4  HGB 11.1* 11.9* 12.1*  HCT 34.3* 36.7* 37.8*  MCV 95.8 98.4 97.7  PLT 175 190 225   Basic Metabolic Panel: Recent Labs  Lab 01/07/22 0440 01/08/22 0509 01/09/22 0758 01/10/22 0559 01/13/22 0633  NA 140 139 138 141 138  K 4.4 3.8 4.0 4.1 5.0  CL 106 105 105 105 100  CO2 32  GLUCOSE 73 105* 84 129* 78  BUN 39* 31* 31* 29* 25*  CREATININE 0.68 0.68 0.71 0.72 0.74  CALCIUM 9.6 9.4 9.4 9.3 9.5  MG 1.8 1.9 1.8 1.9  --   PHOS 4.3  --  2.8 2.6  --    GFR: Estimated Creatinine Clearance: 130.3 mL/min (by C-G formula based on SCr of 0.74 mg/dL). Liver Function Tests: No results for input(s): "AST", "ALT", "ALKPHOS", "BILITOT", "PROT", "ALBUMIN" in the last 168 hours. No results for input(s): "LIPASE", "AMYLASE" in the last 168 hours. No results for input(s): "AMMONIA" in the last 168 hours. Coagulation Profile: No results for input(s): "INR", "PROTIME" in the last 168 hours. Cardiac Enzymes: No results for input(s): "CKTOTAL", "CKMB", "CKMBINDEX", "TROPONINI" in the last 168 hours. BNP (last 3 results) No results for input(s): "PROBNP" in the last 8760 hours. HbA1C: No results for input(s): "HGBA1C" in the last 72 hours. CBG: Recent Labs  Lab 01/12/22 1658 01/12/22 1938 01/13/22 0005 01/13/22 0405 01/13/22 0753  GLUCAP 124* 120* 133* 89 75   Lipid Profile: No results for input(s): "CHOL", "HDL", "LDLCALC", "TRIG", "CHOLHDL", "LDLDIRECT" in the last 72 hours. Thyroid Function Tests: No results for input(s): "TSH", "T4TOTAL", "FREET4",  "T3FREE", "THYROIDAB" in the last 72 hours. Anemia Panel: No results for input(s): "VITAMINB12", "FOLATE", "FERRITIN", "TIBC", "IRON", "RETICCTPCT" in the last 72 hours. Sepsis Labs: No results for input(s): "PROCALCITON", "LATICACIDVEN" in the last 168 hours.  Recent Results (from the past 240 hour(s))  Resp Panel by RT-PCR (Flu A&B, Covid) Anterior Nasal Swab     Status: None   Collection Time: 01/05/22 10:36 PM   Specimen: Anterior Nasal Swab  Result Value Ref Range Status   SARS Coronavirus 2 by RT PCR NEGATIVE NEGATIVE Final    Comment: (NOTE) SARS-CoV-2 target nucleic acids are NOT DETECTED.  The SARS-CoV-2 RNA is generally detectable in upper respiratory specimens during the acute phase of infection. The lowest concentration of SARS-CoV-2 viral copies this assay can detect is 138 copies/mL. A negative result does not preclude SARS-Cov-2 infection and should not be used as the sole basis for treatment or other patient management decisions. A negative result may occur with  improper specimen collection/handling, submission of specimen other than nasopharyngeal swab, presence of viral mutation(s) within the areas targeted by this assay, and inadequate number of viral copies(<138  copies/mL). A negative result must be combined with clinical observations, patient history, and epidemiological information. The expected result is Negative.  Fact Sheet for Patients:  BloggerCourse.com  Fact Sheet for Healthcare Providers:  SeriousBroker.it  This test is no t yet approved or cleared by the [place] FDA and  has been authorized for detection and/or diagnosis of SARS-CoV-2 by FDA under an Emergency Use Authorization (EUA). This EUA will remain  in effect (meaning this test can be used) for the duration of the COVID-19 declaration under Section 564(b)(1) of the Act, 21 U.S.C.section 360bbb-3(b)(1), unless the authorization is  terminated  or revoked sooner.       Influenza A by PCR NEGATIVE NEGATIVE Final   Influenza B by PCR NEGATIVE NEGATIVE Final    Comment: (NOTE) The Xpert Xpress SARS-CoV-2/FLU/RSV plus assay is intended as an aid in the diagnosis of influenza from Nasopharyngeal swab specimens and should not be used as a sole basis for treatment. Nasal washings and aspirates are unacceptable for Xpert Xpress SARS-CoV-2/FLU/RSV testing.  Fact Sheet for Patients: BloggerCourse.com  Fact Sheet for Healthcare Providers: SeriousBroker.it  This test is not yet approved or cleared by the [place] FDA and has been authorized for detection and/or diagnosis of SARS-CoV-2 by FDA under an Emergency Use Authorization (EUA). This EUA will remain in effect (meaning this test can be used) for the duration of the COVID-19 declaration under Section 564(b)(1) of the Act, 21 U.S.C. section 360bbb-3(b)(1), unless the authorization is terminated or revoked.  Performed at [facility], [address] [place], [place] [address]   Blood Culture (routine x 2)     Status: None   Collection Time: 01/05/22 10:36 PM   Specimen: BLOOD  Result Value Ref Range Status   Specimen Description BLOOD BLOOD LEFT ARM  Final   Special Requests   Final    BOTTLES DRAWN AEROBIC AND ANAEROBIC Blood Culture adequate volume   Culture   Final    NO GROWTH 5 DAYS Performed at [facility], [address], [place], [place] [address]    Report Status 01/10/2022 FINAL  Final  Blood Culture (routine x 2)     Status: Abnormal   Collection Time: 01/05/22 10:36 PM   Specimen: BLOOD  Result Value Ref Range Status   Specimen Description   Final    BLOOD BLOOD RIGHT ARM Performed at [facility], [address], [place], [place] [address]    Special Requests   Final    BOTTLES DRAWN AEROBIC AND ANAEROBIC Blood Culture results may not be optimal due to an  inadequate volume of blood received in culture bottles Performed at [facility], [address] [place], [place] [address]    Culture  Setup Time   Final    GRAM POSITIVE COCCI ANAEROBIC BOTTLE ONLY CRITICAL RESULT CALLED TO, READ BACK BY AND VERIFIED WITH: RAQUEL RODRIQUEZ @2113  01/06/22 LFD    Culture (A)  Final    STAPHYLOCOCCUS CAPITIS THE SIGNIFICANCE OF ISOLATING THIS ORGANISM FROM A SINGLE SET OF BLOOD CULTURES WHEN MULTIPLE SETS ARE DRAWN IS UNCERTAIN. PLEASE NOTIFY THE MICROBIOLOGY DEPARTMENT WITHIN ONE WEEK IF SPECIATION AND SENSITIVITIES ARE REQUIRED. Performed at [facility], [address], [place], [place]    Report Status 01/08/2022 FINAL  Final  Urine Culture     Status: Abnormal   Collection Time: 01/05/22 10:36 PM   Specimen: In/Out Cath Urine  Result Value Ref Range Status   Specimen Description  Final    IN/OUT CATH URINE Performed at [facility], [address] [place], [place] [address]    Special Requests   Final    NONE Performed at [facility], [address] [place], [place] [address]    Culture MULTIPLE SPECIES PRESENT, SUGGEST RECOLLECTION (A)  Final   Report Status 01/07/2022 FINAL  Final  Blood Culture ID Panel (Reflexed)     Status: Abnormal   Collection Time: 01/05/22 10:36 PM  Result Value Ref Range Status   Enterococcus faecalis NOT DETECTED NOT DETECTED Final   Enterococcus Faecium NOT DETECTED NOT DETECTED Final   Listeria monocytogenes NOT DETECTED NOT DETECTED Final   Staphylococcus species DETECTED (A) NOT DETECTED Final    Comment: CRITICAL RESULT CALLED TO, READ BACK BY AND VERIFIED WITH: RAQUEL RODRIQUEZ @2113  01/06/22 LFD    Staphylococcus aureus (BCID) NOT DETECTED NOT DETECTED Final   Staphylococcus epidermidis NOT DETECTED NOT DETECTED Final   Staphylococcus lugdunensis NOT DETECTED NOT DETECTED Final   Streptococcus species NOT DETECTED NOT DETECTED Final   Streptococcus  agalactiae NOT DETECTED NOT DETECTED Final   Streptococcus pneumoniae NOT DETECTED NOT DETECTED Final   Streptococcus pyogenes NOT DETECTED NOT DETECTED Final   A.calcoaceticus-baumannii NOT DETECTED NOT DETECTED Final   Bacteroides fragilis NOT DETECTED NOT DETECTED Final   Enterobacterales NOT DETECTED NOT DETECTED Final   Enterobacter cloacae complex NOT DETECTED NOT DETECTED Final   Escherichia coli NOT DETECTED NOT DETECTED Final   Klebsiella aerogenes NOT DETECTED NOT DETECTED Final   Klebsiella oxytoca NOT DETECTED NOT DETECTED Final   Klebsiella pneumoniae NOT DETECTED NOT DETECTED Final   Proteus species NOT DETECTED NOT DETECTED Final   Salmonella species NOT DETECTED NOT DETECTED Final   Serratia marcescens NOT DETECTED NOT DETECTED Final   Haemophilus influenzae NOT DETECTED NOT DETECTED Final   Neisseria meningitidis NOT DETECTED NOT DETECTED Final   Pseudomonas aeruginosa NOT DETECTED NOT DETECTED Final   Stenotrophomonas maltophilia NOT DETECTED NOT DETECTED Final   Candida albicans NOT DETECTED NOT DETECTED Final   Candida auris NOT DETECTED NOT DETECTED Final   Candida glabrata NOT DETECTED NOT DETECTED Final   Candida krusei NOT DETECTED NOT DETECTED Final   Candida parapsilosis NOT DETECTED NOT DETECTED Final   Candida tropicalis NOT DETECTED NOT DETECTED Final   Cryptococcus neoformans/gattii NOT DETECTED NOT DETECTED Final    Comment: Performed at [facility], [address] [place], [place]  MRSA Next Gen by PCR, Nasal     Status: None   Collection Time: 01/06/22  1:38 AM   Specimen: Nasal Mucosa; Nasal Swab  Result Value Ref Range Status   MRSA by PCR Next Gen NOT DETECTED NOT DETECTED Final    Comment: (NOTE) The GeneXpert MRSA Assay (FDA approved for NASAL specimens only), is one component of a comprehensive MRSA colonization surveillance program. It is not intended to diagnose MRSA infection nor to guide or monitor treatment for MRSA  infections. Test performance is not FDA approved in patients less than 46 years old. Performed at [facility], [address] [place], [place]   Culture, Respiratory w Gram Stain     Status: None (Preliminary result)   Collection Time: 01/12/22  4:15 PM   Specimen: Tracheal Aspirate; Respiratory  Result Value Ref Range Status   Specimen Description   Final    TRACHEAL ASPIRATE Performed at [facility], [address], [place], [place]    Special Requests  Final    NONE Performed at [facility], [address] [place], [place] [address]    Gram Stain   Final    RARE WBC PRESENT, PREDOMINANTLY MONONUCLEAR FEW GRAM POSITIVE COCCI IN PAIRS FEW GRAM NEGATIVE RODS    Culture   Final    TOO YOUNG TO READ Performed at [facility], [address], [place], [place] [address]    Report Status PENDING  Incomplete         Radiology Studies: No results found.      Scheduled Meds:  amoxicillin-clavulanate  500 mg Per Tube TID   atropine  2 drop Sublingual QID   carvedilol  3.125 mg Per Tube BID WC   Chlorhexidine Gluconate Cloth  6 each Topical Daily   clonazePAM  0.5 mg Per Tube BID   famotidine  20 mg Per Tube BID   feeding supplement (OSMOLITE 1.5 CAL)  355 mL Per Tube QID   feeding supplement (PROSource TF20)  60 mL Per Tube Daily   fiber  1 packet Per Tube BID   free water  200 mL Per Tube Q6H   furosemide  40 mg Intravenous Daily   glycopyrrolate  1 mg Per Tube TID   hydrALAZINE  50 mg Per Tube TID   levETIRAcetam  750 mg Per Tube BID   mouth rinse  15 mL Mouth Rinse 4 times per day   pantoprazole (PROTONIX) IV  40 mg Intravenous Daily   scopolamine  1 patch Transdermal Q72H   sodium chloride flush  3 mL Intravenous Q12H   spironolactone  25 mg Per Tube Daily   valproic acid  750 mg Per Tube TID   Continuous Infusions:   LOS: 7 days   Time spent= 35 mins    Sarh Kirschenbaum Joline Maxcy, MD [facility]  If 7PM-7AM, please contact night-coverage  01/13/2022, 9:04 AM

## 2022-01-13 NOTE — Progress Notes (Signed)
Rechecked glucose, currenly 72. Made Dr. Nelson Chimes aware per order if less than 90. Patient is currently NPO for bronchoscopy

## 2022-01-13 NOTE — Consult Note (Signed)
PULMONOLOGY         Date: 01/13/2022,   MRN# [id number] Billy Cherry [birth year]     AdmissionWeight: 108.9 kg                 CurrentWeight: 94.3 kg  Referring provider: Fallis:   Acute on chronic hypoxemic respiratory failure    HISTORY OF PRESENT ILLNESS   Billy Cherry is a 51 y.o. male with medical history significant for anoxic brain injury and persistent vegetative state status post tracheostomy and PEG tube placement, chronic systolic CHF, hypertension, DVT on chronic anticoagulation, who presents with fever and tachycardia.Patient was brought in from home due to fever, tachycardia, and thick secretions from his tracheostomy tube.  No additional history given. Patient was febrile on arrival with tachypnea and tacchycardia and hypoxemia.  CBC notable for white count of 17, mild anemia with hemoglobin 12.2, CMP was unremarkable.  Lactic acid was elevated at 2.5, chest x-ray showed low lung volumes and no obvious infiltrates.  Blood and urine cultures were obtained.  Respiratory viral panel was obtained.  He was given a dose of azithromycin, cefepime, vancomycin, 1 L NS bolus, and admitted for further management. He had CT chest abd and pelvis in June 2023 with findings of pulmonary interstitial edema bilaterally at moderate severity, he had CXR on arrival with findings of poor inspiratory effort, mild pulmonary edema.   01/13/22- patient with persistent high volume mucopuruent secretions despite antibiotics.  Will perform airway inspection and bronchial washings for microbiology today.   PAST MEDICAL HISTORY   Past Medical History:  Diagnosis Date   Hypertension      SURGICAL HISTORY   Past Surgical History:  Procedure Laterality Date   BACK SURGERY     CORONARY/GRAFT ACUTE MI REVASCULARIZATION N/A 03/23/2021   Procedure: Coronary/Graft Acute MI Revascularization;  Surgeon: Isaias Cowman, MD;  Location: [facility];   Service: Cardiovascular;  Laterality: N/A;   IR GASTROSTOMY TUBE MOD SED  04/26/2021   LEFT HEART CATH AND CORONARY ANGIOGRAPHY N/A 03/23/2021   Procedure: LEFT HEART CATH AND CORONARY ANGIOGRAPHY;  Surgeon: Isaias Cowman, MD;  Location: [facility];  Service: Cardiovascular;  Laterality: N/A;   SHOULDER SURGERY       FAMILY HISTORY   No family history on file.   SOCIAL HISTORY   Social History   Tobacco Use   Smoking status: Never   Smokeless tobacco: Never  Vaping Use   Vaping Use: Unknown  Substance Use Topics   Alcohol use: Yes   Drug use: Never     MEDICATIONS    Home Medication:    Current Medication:  Current Facility-Administered Medications:    midazolam (VERSED) 2 MG/2ML injection, , , ,    acetaminophen (TYLENOL) tablet 650 mg, 650 mg, Oral, Q6H PRN **OR** acetaminophen (TYLENOL) suppository 650 mg, 650 mg, Rectal, Q6H PRN, Clarnce Flock, MD   amoxicillin-clavulanate (AUGMENTIN) 600-42.9 MG/5ML suspension 500 mg, 500 mg, Per Tube, TID, Kc, Ramesh, MD, 500 mg at 01/12/22 1627   atropine 1 % ophthalmic solution 2 drop, 2 drop, Sublingual, QID, Clarnce Flock, MD, 2 drop at 01/13/22 1439   carvedilol (COREG) tablet 3.125 mg, 3.125 mg, Per Tube, BID WC, Xaivier Malay, MD, 3.125 mg at 01/12/22 1627   Chlorhexidine Gluconate Cloth 2 % PADS 6 each, 6 each, Topical, Daily, Sharen Hones, MD, 6 each at 01/12/22 1002   clonazePAM (KLONOPIN) tablet 0.5 mg, 0.5 mg,  Per Tube, BID, Venora Maples, MD, 0.5 mg at 01/12/22 2219   famotidine (PEPCID) tablet 20 mg, 20 mg, Per Tube, BID, Venora Maples, MD, 20 mg at 01/12/22 2219   feeding supplement (OSMOLITE 1.5 CAL) liquid 355 mL, 355 mL, Per Tube, QID, Marrion Coy, MD, 355 mL at 01/12/22 2233   feeding supplement (PROSource TF20) liquid 60 mL, 60 mL, Per Tube, Daily, Marrion Coy, MD, 60 mL at 01/12/22 1225   fiber (NUTRISOURCE FIBER) 1 packet, 1 packet, Per Tube, BID, Venora Maples, MD,  1 packet at 01/12/22 2220   free water 200 mL, 200 mL, Per Tube, Q6H, Marrion Coy, MD, 200 mL at 01/12/22 2234   furosemide (LASIX) injection 40 mg, 40 mg, Intravenous, Daily, Vida Rigger, MD, 40 mg at 01/13/22 1110   glycopyrrolate (ROBINUL) tablet 1 mg, 1 mg, Per Tube, TID, Venora Maples, MD, 1 mg at 01/12/22 2220   guaiFENesin (ROBITUSSIN) 100 MG/5ML liquid 5 mL, 5 mL, Oral, Q4H PRN, Amin, Ankit Chirag, MD   hydrALAZINE (APRESOLINE) injection 10 mg, 10 mg, Intravenous, Q4H PRN, Amin, Ankit Chirag, MD   hydrALAZINE (APRESOLINE) tablet 50 mg, 50 mg, Per Tube, TID, Venora Maples, MD, 50 mg at 01/12/22 2219   ipratropium-albuterol (DUONEB) 0.5-2.5 (3) MG/3ML nebulizer solution 3 mL, 3 mL, Nebulization, Q4H PRN, Venora Maples, MD   levETIRAcetam (KEPPRA) 100 MG/ML solution 750 mg, 750 mg, Per Tube, BID, Venora Maples, MD, 750 mg at 01/12/22 2221   loperamide HCl (IMODIUM) 1 MG/7.5ML suspension 2 mg, 2 mg, Per Tube, Daily PRN, Venora Maples, MD   metoCLOPramide (REGLAN) injection 10 mg, 10 mg, Intravenous, Q6H PRN, Kc, Ramesh, MD   metoprolol tartrate (LOPRESSOR) injection 5 mg, 5 mg, Intravenous, Q4H PRN, Amin, Ankit Chirag, MD   ondansetron (ZOFRAN) tablet 4 mg, 4 mg, Oral, Q6H PRN **OR** ondansetron (ZOFRAN) injection 4 mg, 4 mg, Intravenous, Q6H PRN, Venora Maples, MD, 4 mg at 01/07/22 1530   Oral care mouth rinse, 15 mL, Mouth Rinse, 4 times per day, Marrion Coy, MD, 15 mL at 01/13/22 1137   Oral care mouth rinse, 15 mL, Mouth Rinse, PRN, Marrion Coy, MD   oxyCODONE (Oxy IR/ROXICODONE) immediate release tablet 5 mg, 5 mg, Oral, Q4H PRN, Venora Maples, MD   pantoprazole (PROTONIX) injection 40 mg, 40 mg, Intravenous, Daily, Beers, Sherlynn Carbon, RPH, 40 mg at 01/13/22 1110   polyethylene glycol (MIRALAX / GLYCOLAX) packet 17 g, 17 g, Oral, Daily PRN, Venora Maples, MD   scopolamine (TRANSDERM-SCOP) 1 MG/3DAYS 1.5 mg, 1 patch, Transdermal, Q72H, Venora Maples, MD, 1.5 mg at 01/12/22 1001   sodium chloride flush (NS) 0.9 % injection 3 mL, 3 mL, Intravenous, Q12H, Venora Maples, MD, 3 mL at 01/13/22 1137   spironolactone (ALDACTONE) tablet 25 mg, 25 mg, Per Tube, Daily, Kc, Ramesh, MD, 25 mg at 01/12/22 1000   traZODone (DESYREL) tablet 50 mg, 50 mg, Oral, QHS PRN, Venora Maples, MD   valproic acid (DEPAKENE) 250 MG/5ML solution 750 mg, 750 mg, Per Tube, TID, Venora Maples, MD, 750 mg at 01/12/22 2220    ALLERGIES   Zestril [lisinopril]     REVIEW OF SYSTEMS    Review of Systems:  Gen:  Denies  fever, sweats, chills weigh loss  HEENT: Denies blurred vision, double vision, ear pain, eye pain, hearing loss, nose bleeds, sore throat Cardiac:  No dizziness, chest pain or heaviness, chest tightness,edema Resp:  reports dyspnea chronically  Gi: Denies swallowing difficulty, stomach pain, nausea or vomiting, diarrhea, constipation, bowel incontinence Gu:  Denies bladder incontinence, burning urine Ext:   Denies Joint pain, stiffness or swelling Skin: Denies  skin rash, easy bruising or bleeding or hives Endoc:  Denies polyuria, polydipsia , polyphagia or weight change Psych:   Denies depression, insomnia or hallucinations   Other:  All other systems negative   VS: BP (!) 122/100 (BP Location: Right Arm)   Pulse 84   Temp 98.8 F (37.1 C)   Resp 19   Ht 6' (1.829 m)   Wt 94.3 kg   SpO2 100%   BMI 28.20 kg/m      PHYSICAL EXAM    GENERAL:NAD, no fevers, chills, no weakness no fatigue HEAD: Normocephalic, atraumatic.  EYES: Pupils equal, round, reactive to light. Extraocular muscles intact. No scleral icterus.  MOUTH: Moist mucosal membrane. Dentition intact. No abscess noted.  EAR, NOSE, THROAT: Clear without exudates. No external lesions. , +trache NECK: Supple. No thyromegaly. No nodules. No JVD.  PULMONARY: decreased breath sounds with mild rhonchi worse at bases bilaterally.  CARDIOVASCULAR: S1  and S2. Regular rate and rhythm. No murmurs, rubs, or gallops. No edema. Pedal pulses 2+ bilaterally.  GASTROINTESTINAL: Soft, nontender, nondistended. No masses. Positive bowel sounds. No hepatosplenomegaly.  MUSCULOSKELETAL: No swelling, clubbing, or edema. Range of motion full in all extremities.  NEUROLOGIC: Cranial nerves II through XII are intact. No gross focal neurological deficits. Sensation intact. Reflexes intact.  SKIN: No ulceration, lesions, rashes, or cyanosis. Skin warm and dry. Turgor intact.  PSYCHIATRIC: Mood, affect within normal limits. The patient is awake, alert and oriented x 3. Insight, judgment intact.       IMAGING     ASSESSMENT/PLAN   Acute on chronic hypoxemic respiratory failure -  due to possible tracheitis  -he has been on agumentin for aspiration  - his blood cultures have + staph epidermitis - he has not been found to have respiratory infection  -his TTE shows severe systolic CHF with EF [id number] -patient on scopolamine, oxycodone and rubinol -tracheal aspirate today to see if its infected and get sensitivity panel - will diurese due to previous pulmonary congestion which may be part of this      Thank you for allowing me to participate in the care of this patient.   Patient/Family are satisfied with care plan and all questions have been answered.    Provider disclosure: Patient with at least one acute or chronic illness or injury that poses a threat to life or bodily function and is being managed actively during this encounter.  All of the below services have been performed independently by signing provider:  review of prior documentation from internal and or external health records.  Review of previous and current lab results.  Interview and comprehensive assessment during patient visit today. Review of current and previous chest radiographs/CT scans. Discussion of management and test interpretation with health care team and patient/family.    This document was prepared using Dragon voice recognition software and may include unintentional dictation errors.     Ottie Glazier, M.D.  Division of Pulmonary & Critical Care Medicine

## 2022-01-14 DIAGNOSIS — A419 Sepsis, unspecified organism: Secondary | ICD-10-CM | POA: Diagnosis not present

## 2022-01-14 DIAGNOSIS — R6520 Severe sepsis without septic shock: Secondary | ICD-10-CM | POA: Diagnosis not present

## 2022-01-14 LAB — BASIC METABOLIC PANEL
Anion gap: 7 (ref 5–15)
BUN: 25 mg/dL — ABNORMAL HIGH (ref 6–20)
CO2: 31 mmol/L (ref 22–32)
Calcium: 9.9 mg/dL (ref 8.9–10.3)
Chloride: 99 mmol/L (ref 98–111)
Creatinine, Ser: 0.86 mg/dL (ref 0.61–1.24)
GFR, Estimated: >60 mL/min (ref >60)
Glucose, Bld: 84 mg/dL (ref 70–99)
Potassium: 5.3 mmol/L — ABNORMAL HIGH (ref 3.5–5.1)
Sodium: 137 mmol/L (ref 135–145)

## 2022-01-14 LAB — GLUCOSE, CAPILLARY
Glucose-Capillary: 111 mg/dL — ABNORMAL HIGH (ref 70–99)
Glucose-Capillary: 127 mg/dL — ABNORMAL HIGH (ref 70–99)
Glucose-Capillary: 133 mg/dL — ABNORMAL HIGH (ref 70–99)
Glucose-Capillary: 140 mg/dL — ABNORMAL HIGH (ref 70–99)
Glucose-Capillary: 143 mg/dL — ABNORMAL HIGH (ref 70–99)
Glucose-Capillary: 67 mg/dL — ABNORMAL LOW (ref 70–99)
Glucose-Capillary: 78 mg/dL (ref 70–99)
Glucose-Capillary: 79 mg/dL (ref 70–99)

## 2022-01-14 LAB — CBC
HCT: 43 % (ref 39.0–52.0)
Hemoglobin: 14.1 g/dL (ref 13.0–17.0)
MCH: 32 pg (ref 26.0–34.0)
MCHC: 32.8 g/dL (ref 30.0–36.0)
MCV: 97.5 fL (ref 80.0–100.0)
Platelets: 188 10*3/uL (ref 150–400)
RBC: 4.41 MIL/uL (ref 4.22–5.81)
RDW: 13.7 % (ref 11.5–15.5)
WBC: 10 10*3/uL (ref 4.0–10.5)
nRBC: 0 % (ref 0.0–0.2)

## 2022-01-14 LAB — MAGNESIUM: Magnesium: 2.1 mg/dL (ref 1.7–2.4)

## 2022-01-14 MED ORDER — GLUCAGON HCL RDNA (DIAGNOSTIC) 1 MG IJ SOLR: 1.0000 mg | Freq: Once | INTRAMUSCULAR | Status: DC | PRN

## 2022-01-14 MED ORDER — SODIUM ZIRCONIUM CYCLOSILICATE 10 G PO PACK
10.0000 g | PACK | Freq: Once | ORAL | Status: AC
  Administered 2022-01-14: 10 g
  Filled 2022-01-14: qty 1

## 2022-01-14 MED ORDER — STERILE WATER FOR INJECTION IJ SOLN
INTRAMUSCULAR | Status: AC
  Administered 2022-01-14: 10 mL
  Filled 2022-01-14: qty 10

## 2022-01-14 NOTE — Progress Notes (Signed)
PROGRESS NOTE    Billy Cherry  RKY:[id number] DOB: [birth year] DOA: 01/05/2022 PCP: [facility]   Brief Narrative:  51 y.o. male with medical history significant for anoxic brain injury and persistent vegetative state status post tracheostomy and PEG tube placement, chronic systolic CHF, hypertension, DVT on chronic anticoagulation, who presents with fever and tachycardia.  Patient had a thick secretion from trach tube, chest x-ray showed bilateral lower lobe atelectasis.  Patient also had a lactic acid level of 2.7. Admitted for sepsis likely from aspiration pneumonia placed on antibiotics cefepime, Flagyl.  Patient developed significant nausea vomiting on 11/4, KUB did not see any acute changes.  Started on Reglan.  Patient wife also had GI symptoms, it appears that patient had gastroenteritis, causing nausea, subsequently caused aspiration pneumonia. Patient antibiotic was switched to Augmentin on 11/7.  Blood culture with Staph capitis in single single set likely contamination. Patient continued with full supportive care trach care respiratory care. Patient given few days of IV Lasix swelling has improved weight has returned to his baseline. Overall remains stable given his overall status and always at risk of aspiration/infection/pneumonia bedsores. Advise continued follow-up with his pulmonary team at [facility]. Pulm team consulted for increase in tracheal secretions. Patient had bronchoscopy on 11/11 showing b/l mucopurulent secretions.    Assessment & Plan:  Principal Problem:   Severe sepsis ([name]) Active Problems:   Anoxic brain injury ([name])   DVT (deep venous thrombosis) ([name])   HTN (hypertension)   Chronic systolic CHF (congestive heart failure) ([name])   Tracheostomy dependent ([name])   S/P percutaneous endoscopic gastrostomy (PEG) tube placement ([name])   Aspiration pneumonia of both lower lobes ([name])   Gastroenteritis    Severe sepsis due to aspiration pneumonia Aspiration  pneumonia Trachitis with increase tracheal secretions.  Bcx had grown Stap Capitis treated with Augmentin; continue for now. Still concerns of infection, therefore tracheal secretions ordered. Patient had bronchoscopy on 11/11 showing b/l mucopurulent secretions. Awaiting Cultures. Erline Hau Hygiene. Remains on trach collar.   Pulmonary congestion Acute systolic CHF, ef 31% -Diuretics to help with pulm decongestion. On Lasix 40mg  iv daily. Supportive care.  Cont Coreg.   Mild Hyperkalemia Will give one dose of Lokelma.  He is on Lasix and Aldactone.   Acute gastroenteritis: Improved.  w/ loose BM.  Tolerating tube feeding no nausea vomiting.Continue bolus tube feeding.  At home on continues tube feeding, continue supportive care.  Dietitian following.  Borderline blood sugar level: Blood sugar at this time is stable, continue tube feeding.   Anoxic brain injury  and dependent on Trach/Peg, able to track but nonverbal. Continue trach collar and trach suctioning, tube feeding.  Continue home atropine eye drops, Pepcid, Keppra Depakene.  DVT/PE hx: cont  home Eliquis  Hypertension BP fairly stable cont  hydralazine, Coreg     DVT prophylaxis: Place and maintain sequential compression device Start: 01/13/22 0912SCDs Code Status: Full  Family Communication:    Status is: Inpatient Remains inpatient appropriate because: On going evaluation for tracheal secretions.    Nutritional status    Signs/Symptoms: estimated needs  Interventions: Tube feeding  Body mass index is 27.72 kg/m.         Subjective: Feeling ok, no acute events overngiht.  Still having quite a bit of secretions.   Examination: Constitutional: Not in acute distress Respiratory: Clear to auscultation bilaterally Cardiovascular: Normal sinus rhythm, no rubs Abdomen: soft abd Musculoskeletal: difficult to assess.  Skin: trace LE edema.  Neurologic: CN 2-12 grossly intact.  And nonfocal Psychiatric:  unable to assess   Trahc and peg in place.   Objective: Vitals:   01/14/22 0423 01/14/22 0500 01/14/22 0813 01/14/22 0826  BP: (!) 135/100  (!) 149/117   Pulse: 83  80   Resp: 20     Temp: 98.3 F (36.8 C)  99.1 F (37.3 C)   TempSrc:      SpO2: 100%  100% 97%  Weight:  92.7 kg    Height:        Intake/Output Summary (Last 24 hours) at 01/14/2022 0835 Last data filed at 01/14/2022 0506 Gross per 24 hour  Intake 435 ml  Output 1050 ml  Net -615 ml   Filed Weights   01/12/22 0404 01/13/22 0500 01/14/22 0500  Weight: 91.9 kg 94.3 kg 92.7 kg     Data Reviewed:   CBC: Recent Labs  Lab 01/09/22 0758 01/10/22 0559 01/13/22 0633 01/14/22 0730  WBC 6.1 5.8 8.4 10.0  HGB 11.1* 11.9* 12.1* 14.1  HCT 34.3* 36.7* 37.8* 43.0  MCV 95.8 98.4 97.7 97.5  PLT 175 190 225 188   Basic Metabolic Panel: Recent Labs  Lab 01/08/22 0509 01/09/22 0758 01/10/22 0559 01/13/22 0633 01/14/22 0730  NA 139 138 141 138 137  K 3.8 4.0 4.1 5.0 5.3*  CL 105 105 105 100 99  CO2 28 28 30  32 31  GLUCOSE 105* 84 129* 78 84  BUN 31* 31* 29* 25* 25*  CREATININE 0.68 0.71 0.72 0.74 0.86  CALCIUM 9.4 9.4 9.3 9.5 9.9  MG 1.9 1.8 1.9  --  2.1  PHOS  --  2.8 2.6  --   --    GFR: Estimated Creatinine Clearance: 111.5 mL/min (by C-G formula based on SCr of 0.86 mg/dL). Liver Function Tests: No results for input(s): "AST", "ALT", "ALKPHOS", "BILITOT", "PROT", "ALBUMIN" in the last 168 hours. No results for input(s): "LIPASE", "AMYLASE" in the last 168 hours. No results for input(s): "AMMONIA" in the last 168 hours. Coagulation Profile: No results for input(s): "INR", "PROTIME" in the last 168 hours. Cardiac Enzymes: No results for input(s): "CKTOTAL", "CKMB", "CKMBINDEX", "TROPONINI" in the last 168 hours. BNP (last 3 results) No results for input(s): "PROBNP" in the last 8760 hours. HbA1C: No results for input(s): "HGBA1C" in the last 72 hours. CBG: Recent Labs  Lab 01/13/22 1958  01/14/22 0006 01/14/22 0051 01/14/22 0557 01/14/22 0814  GLUCAP 98 140* 127* 78 67*   Lipid Profile: No results for input(s): "CHOL", "HDL", "LDLCALC", "TRIG", "CHOLHDL", "LDLDIRECT" in the last 72 hours. Thyroid Function Tests: No results for input(s): "TSH", "T4TOTAL", "FREET4", "T3FREE", "THYROIDAB" in the last 72 hours. Anemia Panel: No results for input(s): "VITAMINB12", "FOLATE", "FERRITIN", "TIBC", "IRON", "RETICCTPCT" in the last 72 hours. Sepsis Labs: No results for input(s): "PROCALCITON", "LATICACIDVEN" in the last 168 hours.  Recent Results (from the past 240 hour(s))  Resp Panel by RT-PCR (Flu A&B, Covid) Anterior Nasal Swab     Status: None   Collection Time: 01/05/22 10:36 PM   Specimen: Anterior Nasal Swab  Result Value Ref Range Status   SARS Coronavirus 2 by RT PCR NEGATIVE NEGATIVE Final    Comment: (NOTE) SARS-CoV-2 target nucleic acids are NOT DETECTED.  The SARS-CoV-2 RNA is generally detectable in upper respiratory specimens during the acute phase of infection. The lowest concentration of SARS-CoV-2 viral copies this assay can detect is 138 copies/mL. A negative result does not preclude SARS-Cov-2 infection and should not be used as the sole basis for  treatment or other patient management decisions. A negative result may occur with  improper specimen collection/handling, submission of specimen other than nasopharyngeal swab, presence of viral mutation(s) within the areas targeted by this assay, and inadequate number of viral copies(<138 copies/mL). A negative result must be combined with clinical observations, patient history, and epidemiological information. The expected result is Negative.  Fact Sheet for Patients:  BloggerCourse.com  Fact Sheet for Healthcare Providers:  SeriousBroker.it  This test is no t yet approved or cleared by the [place] FDA and  has been authorized for detection  and/or diagnosis of SARS-CoV-2 by FDA under an Emergency Use Authorization (EUA). This EUA will remain  in effect (meaning this test can be used) for the duration of the COVID-19 declaration under Section 564(b)(1) of the Act, 21 U.S.C.section 360bbb-3(b)(1), unless the authorization is terminated  or revoked sooner.       Influenza A by PCR NEGATIVE NEGATIVE Final   Influenza B by PCR NEGATIVE NEGATIVE Final    Comment: (NOTE) The Xpert Xpress SARS-CoV-2/FLU/RSV plus assay is intended as an aid in the diagnosis of influenza from Nasopharyngeal swab specimens and should not be used as a sole basis for treatment. Nasal washings and aspirates are unacceptable for Xpert Xpress SARS-CoV-2/FLU/RSV testing.  Fact Sheet for Patients: BloggerCourse.com  Fact Sheet for Healthcare Providers: SeriousBroker.it  This test is not yet approved or cleared by the [place] FDA and has been authorized for detection and/or diagnosis of SARS-CoV-2 by FDA under an Emergency Use Authorization (EUA). This EUA will remain in effect (meaning this test can be used) for the duration of the COVID-19 declaration under Section 564(b)(1) of the Act, 21 U.S.C. section 360bbb-3(b)(1), unless the authorization is terminated or revoked.  Performed at [facility], [address] [place], [place] [address]   Blood Culture (routine x 2)     Status: None   Collection Time: 01/05/22 10:36 PM   Specimen: BLOOD  Result Value Ref Range Status   Specimen Description BLOOD BLOOD LEFT ARM  Final   Special Requests   Final    BOTTLES DRAWN AEROBIC AND ANAEROBIC Blood Culture adequate volume   Culture   Final    NO GROWTH 5 DAYS Performed at [facility], [address] [place], [place] [address]    Report Status 01/10/2022 FINAL  Final  Blood Culture (routine x 2)     Status: Abnormal   Collection Time: 01/05/22 10:36 PM   Specimen: BLOOD   Result Value Ref Range Status   Specimen Description   Final    BLOOD BLOOD RIGHT ARM Performed at [facility], [address], [place], [place] [address]    Special Requests   Final    BOTTLES DRAWN AEROBIC AND ANAEROBIC Blood Culture results may not be optimal due to an inadequate volume of blood received in culture bottles Performed at [facility], [address] [place], [place] [address]    Culture  Setup Time   Final    GRAM POSITIVE COCCI ANAEROBIC BOTTLE ONLY CRITICAL RESULT CALLED TO, READ BACK BY AND VERIFIED WITH: RAQUEL RODRIQUEZ  01/06/22 LFD    Culture (A)  Final    STAPHYLOCOCCUS CAPITIS THE SIGNIFICANCE OF ISOLATING THIS ORGANISM FROM A SINGLE SET OF BLOOD CULTURES WHEN MULTIPLE SETS ARE DRAWN IS UNCERTAIN. PLEASE NOTIFY THE MICROBIOLOGY DEPARTMENT WITHIN ONE WEEK IF SPECIATION AND SENSITIVITIES ARE REQUIRED. Performed at [facility], [address], [place], [place] [address]  Report Status 01/08/2022 FINAL  Final  Urine Culture     Status: Abnormal   Collection Time: 01/05/22 10:36 PM   Specimen: In/Out Cath Urine  Result Value Ref Range Status   Specimen Description   Final    IN/OUT CATH URINE Performed at [facility], [address] [place], [place] [address]    Special Requests   Final    NONE Performed at [facility], [address] [place], [place] [address]    Culture MULTIPLE SPECIES PRESENT, SUGGEST RECOLLECTION (A)  Final   Report Status 01/07/2022 FINAL  Final  Blood Culture ID Panel (Reflexed)     Status: Abnormal   Collection Time: 01/05/22 10:36 PM  Result Value Ref Range Status   Enterococcus faecalis NOT DETECTED NOT DETECTED Final   Enterococcus Faecium NOT DETECTED NOT DETECTED Final   Listeria monocytogenes NOT DETECTED NOT DETECTED Final   Staphylococcus species DETECTED (A) NOT DETECTED Final    Comment: CRITICAL RESULT CALLED TO, READ BACK BY AND VERIFIED WITH: RAQUEL  RODRIQUEZ @2113  01/06/22 LFD    Staphylococcus aureus (BCID) NOT DETECTED NOT DETECTED Final   Staphylococcus epidermidis NOT DETECTED NOT DETECTED Final   Staphylococcus lugdunensis NOT DETECTED NOT DETECTED Final   Streptococcus species NOT DETECTED NOT DETECTED Final   Streptococcus agalactiae NOT DETECTED NOT DETECTED Final   Streptococcus pneumoniae NOT DETECTED NOT DETECTED Final   Streptococcus pyogenes NOT DETECTED NOT DETECTED Final   A.calcoaceticus-baumannii NOT DETECTED NOT DETECTED Final   Bacteroides fragilis NOT DETECTED NOT DETECTED Final   Enterobacterales NOT DETECTED NOT DETECTED Final   Enterobacter cloacae complex NOT DETECTED NOT DETECTED Final   Escherichia coli NOT DETECTED NOT DETECTED Final   Klebsiella aerogenes NOT DETECTED NOT DETECTED Final   Klebsiella oxytoca NOT DETECTED NOT DETECTED Final   Klebsiella pneumoniae NOT DETECTED NOT DETECTED Final   Proteus species NOT DETECTED NOT DETECTED Final   Salmonella species NOT DETECTED NOT DETECTED Final   Serratia marcescens NOT DETECTED NOT DETECTED Final   Haemophilus influenzae NOT DETECTED NOT DETECTED Final   Neisseria meningitidis NOT DETECTED NOT DETECTED Final   Pseudomonas aeruginosa NOT DETECTED NOT DETECTED Final   Stenotrophomonas maltophilia NOT DETECTED NOT DETECTED Final   Candida albicans NOT DETECTED NOT DETECTED Final   Candida auris NOT DETECTED NOT DETECTED Final   Candida glabrata NOT DETECTED NOT DETECTED Final   Candida krusei NOT DETECTED NOT DETECTED Final   Candida parapsilosis NOT DETECTED NOT DETECTED Final   Candida tropicalis NOT DETECTED NOT DETECTED Final   Cryptococcus neoformans/gattii NOT DETECTED NOT DETECTED Final    Comment: Performed at [facility], [address] [place], [place]  MRSA Next Gen by PCR, Nasal     Status: None   Collection Time: 01/06/22  1:38 AM   Specimen: Nasal Mucosa; Nasal Swab  Result Value Ref Range Status   MRSA by PCR Next  Gen NOT DETECTED NOT DETECTED Final    Comment: (NOTE) The GeneXpert MRSA Assay (FDA approved for NASAL specimens only), is one component of a comprehensive MRSA colonization surveillance program. It is not intended to diagnose MRSA infection nor to guide or monitor treatment for MRSA infections. Test performance is not FDA approved in patients less than 37 years old. Performed at [facility], [address] [place], [place]   Culture, Respiratory w Gram Stain     Status: None (Preliminary result)   Collection Time: 01/12/22  4:15 PM  Specimen: Tracheal Aspirate; Respiratory  Result Value Ref Range Status   Specimen Description   Final    TRACHEAL ASPIRATE Performed at [facility], [address] [place], [place] [address]    Special Requests   Final    NONE Performed at [facility], [address] [place], [place] [address]    Gram Stain   Final    RARE WBC PRESENT, PREDOMINANTLY MONONUCLEAR FEW GRAM POSITIVE COCCI IN PAIRS FEW GRAM NEGATIVE RODS    Culture   Final    TOO YOUNG TO READ Performed at [facility], [address] [place], [place] [address]    Report Status PENDING  Incomplete         Radiology Studies: No results found.      Scheduled Meds:  amoxicillin-clavulanate  500 mg Per Tube TID   atropine  2 drop Sublingual QID   carvedilol  3.125 mg Per Tube BID WC   Chlorhexidine Gluconate Cloth  6 each Topical Daily   clonazePAM  0.5 mg Per Tube BID   famotidine  20 mg Per Tube BID   feeding supplement (OSMOLITE 1.5 CAL)  355 mL Per Tube QID   feeding supplement (PROSource TF20)  60 mL Per Tube Daily   fiber  1 packet Per Tube BID   free water  200 mL Per Tube Q6H   furosemide  40 mg Intravenous Daily   glycopyrrolate  1 mg Per Tube TID   hydrALAZINE  50 mg Per Tube TID   levETIRAcetam  750 mg Per Tube BID   mouth rinse  15 mL Mouth Rinse 4 times per day   pantoprazole (PROTONIX) IV  40 mg  Intravenous Daily   scopolamine  1 patch Transdermal Q72H   sodium chloride flush  3 mL Intravenous Q12H   spironolactone  25 mg Per Tube Daily   valproic acid  750 mg Per Tube TID   Continuous Infusions:   LOS: 8 days   Time spent= 35 mins    Jancy Sprankle Joline Maxcy, MD [facility]  If 7PM-7AM, please contact night-coverage  01/14/2022, 8:35 AM

## 2022-01-14 NOTE — Progress Notes (Signed)
PULMONOLOGY         Date: 01/14/2022,   MRN# [id number] Billy Cherry 01-06-1971     AdmissionWeight: 108.9 kg                 CurrentWeight: 92.7 kg  Referring provider: [place]:   Acute on chronic hypoxemic respiratory failure    HISTORY OF PRESENT ILLNESS   Billy Cherry is a 51 y.o. male with medical history significant for anoxic brain injury and persistent vegetative state status post tracheostomy and PEG tube placement, chronic systolic CHF, hypertension, DVT on chronic anticoagulation, who presents with fever and tachycardia.Patient was brought in from home due to fever, tachycardia, and thick secretions from his tracheostomy tube.  No additional history given. Patient was febrile on arrival with tachypnea and tacchycardia and hypoxemia.  CBC notable for white count of 17, mild anemia with hemoglobin 12.2, CMP was unremarkable.  Lactic acid was elevated at 2.5, chest x-ray showed low lung volumes and no obvious infiltrates.  Blood and urine cultures were obtained.  Respiratory viral panel was obtained.  He was given a dose of azithromycin, cefepime, vancomycin, 1 L NS bolus, and admitted for further management. He had CT chest abd and pelvis in June 2023 with findings of pulmonary interstitial edema bilaterally at moderate severity, he had CXR on arrival with findings of poor inspiratory effort, mild pulmonary edema.   01/13/22- patient with persistent high volume mucopuruent secretions despite antibiotics.  Will perform airway inspection and bronchial washings for microbiology today.  01/14/22 - patient doing well on 5L/min seems to be coughing less with less secretions.  Reviewed medical plan with Dr Reesa Chew today.   PAST MEDICAL HISTORY   Past Medical History:  Diagnosis Date   Hypertension      SURGICAL HISTORY   Past Surgical History:  Procedure Laterality Date   BACK SURGERY     CORONARY/GRAFT ACUTE MI REVASCULARIZATION N/A  03/23/2021   Procedure: Coronary/Graft Acute MI Revascularization;  Surgeon: Isaias Cowman, MD;  Location: [facility];  Service: Cardiovascular;  Laterality: N/A;   IR GASTROSTOMY TUBE MOD SED  04/26/2021   LEFT HEART CATH AND CORONARY ANGIOGRAPHY N/A 03/23/2021   Procedure: LEFT HEART CATH AND CORONARY ANGIOGRAPHY;  Surgeon: Isaias Cowman, MD;  Location: [facility];  Service: Cardiovascular;  Laterality: N/A;   SHOULDER SURGERY       FAMILY HISTORY   No family history on file.   SOCIAL HISTORY   Social History   Tobacco Use   Smoking status: Never   Smokeless tobacco: Never  Vaping Use   Vaping Use: Unknown  Substance Use Topics   Alcohol use: Yes   Drug use: Never     MEDICATIONS    Home Medication:    Current Medication:  Current Facility-Administered Medications:    acetaminophen (TYLENOL) tablet 650 mg, 650 mg, Oral, Q6H PRN **OR** acetaminophen (TYLENOL) suppository 650 mg, 650 mg, Rectal, Q6H PRN, Clarnce Flock, MD   amoxicillin-clavulanate (AUGMENTIN) 600-42.9 MG/5ML suspension 500 mg, 500 mg, Per Tube, TID, Kc, Ramesh, MD, 500 mg at 01/14/22 1104   atropine 1 % ophthalmic solution 2 drop, 2 drop, Sublingual, QID, Clarnce Flock, MD, 2 drop at 01/14/22 1106   carvedilol (COREG) tablet 3.125 mg, 3.125 mg, Per Tube, BID WC, Lanney Gins, Billy Deutscher, MD, 3.125 mg at 01/14/22 1107   Chlorhexidine Gluconate Cloth 2 % PADS 6 each, 6 each, Topical, Daily, Sharen Hones, MD, 6  each at 01/14/22 1107   clonazePAM (KLONOPIN) tablet 0.5 mg, 0.5 mg, Per Tube, BID, Clarnce Flock, MD, 0.5 mg at 01/14/22 1105   famotidine (PEPCID) tablet 20 mg, 20 mg, Per Tube, BID, Clarnce Flock, MD, 20 mg at 01/14/22 1104   feeding supplement (OSMOLITE 1.5 CAL) liquid 355 mL, 355 mL, Per Tube, QID, Sharen Hones, MD, 355 mL at 01/14/22 1108   feeding supplement (PROSource TF20) liquid 60 mL, 60 mL, Per Tube, Daily, Sharen Hones, MD, 60 mL at 01/14/22  1132   fiber (NUTRISOURCE FIBER) 1 packet, 1 packet, Per Tube, BID, Clarnce Flock, MD, 1 packet at 01/14/22 1104   free water 200 mL, 200 mL, Per Tube, Q6H, Sharen Hones, MD, 200 mL at 01/14/22 1111   furosemide (LASIX) injection 40 mg, 40 mg, Intravenous, Daily, Ottie Glazier, MD, 40 mg at 01/14/22 1104   glucagon (human recombinant) (GLUCAGEN) injection 1 mg, 1 mg, Intravenous, Once PRN, Amin, Ankit Chirag, MD   glycopyrrolate (ROBINUL) tablet 1 mg, 1 mg, Per Tube, TID, Clarnce Flock, MD, 1 mg at 01/14/22 1105   guaiFENesin (ROBITUSSIN) 100 MG/5ML liquid 5 mL, 5 mL, Oral, Q4H PRN, Amin, Ankit Chirag, MD   hydrALAZINE (APRESOLINE) injection 10 mg, 10 mg, Intravenous, Q4H PRN, Amin, Ankit Chirag, MD   hydrALAZINE (APRESOLINE) tablet 50 mg, 50 mg, Per Tube, TID, Clarnce Flock, MD, 50 mg at 01/14/22 1104   ipratropium-albuterol (DUONEB) 0.5-2.5 (3) MG/3ML nebulizer solution 3 mL, 3 mL, Nebulization, Q4H PRN, Clarnce Flock, MD   levETIRAcetam (KEPPRA) 100 MG/ML solution 750 mg, 750 mg, Per Tube, BID, Clarnce Flock, MD, 750 mg at 01/14/22 1104   loperamide HCl (IMODIUM) 1 MG/7.5ML suspension 2 mg, 2 mg, Per Tube, Daily PRN, Clarnce Flock, MD   metoCLOPramide (REGLAN) injection 10 mg, 10 mg, Intravenous, Q6H PRN, Kc, Ramesh, MD   metoprolol tartrate (LOPRESSOR) injection 5 mg, 5 mg, Intravenous, Q4H PRN, Amin, Ankit Chirag, MD   ondansetron (ZOFRAN) tablet 4 mg, 4 mg, Oral, Q6H PRN **OR** ondansetron (ZOFRAN) injection 4 mg, 4 mg, Intravenous, Q6H PRN, Clarnce Flock, MD, 4 mg at 01/07/22 1530   Oral care mouth rinse, 15 mL, Mouth Rinse, 4 times per day, Sharen Hones, MD, 15 mL at 01/14/22 1109   Oral care mouth rinse, 15 mL, Mouth Rinse, PRN, Sharen Hones, MD   oxyCODONE (Oxy IR/ROXICODONE) immediate release tablet 5 mg, 5 mg, Oral, Q4H PRN, Clarnce Flock, MD   pantoprazole (PROTONIX) injection 40 mg, 40 mg, Intravenous, Daily, Beers, Shanon Brow, RPH, 40 mg at  01/14/22 1104   polyethylene glycol (MIRALAX / GLYCOLAX) packet 17 g, 17 g, Oral, Daily PRN, Clarnce Flock, MD   scopolamine (TRANSDERM-SCOP) 1 MG/3DAYS 1.5 mg, 1 patch, Transdermal, Q72H, Clarnce Flock, MD, 1.5 mg at 01/12/22 1001   sodium chloride flush (NS) 0.9 % injection 3 mL, 3 mL, Intravenous, Q12H, Clarnce Flock, MD, 3 mL at 01/14/22 1110   spironolactone (ALDACTONE) tablet 25 mg, 25 mg, Per Tube, Daily, Kc, Ramesh, MD, 25 mg at 01/14/22 1109   traZODone (DESYREL) tablet 50 mg, 50 mg, Oral, QHS PRN, Clarnce Flock, MD   valproic acid (DEPAKENE) 250 MG/5ML solution 750 mg, 750 mg, Per Tube, TID, Clarnce Flock, MD, 750 mg at 01/14/22 1106    ALLERGIES   Zestril [lisinopril]     REVIEW OF SYSTEMS    Review of Systems:  Gen:  Denies  fever, sweats,  chills weigh loss  HEENT: Denies blurred vision, double vision, ear pain, eye pain, hearing loss, nose bleeds, sore throat Cardiac:  No dizziness, chest pain or heaviness, chest tightness,edema Resp:   reports dyspnea chronically  Gi: Denies swallowing difficulty, stomach pain, nausea or vomiting, diarrhea, constipation, bowel incontinence Gu:  Denies bladder incontinence, burning urine Ext:   Denies Joint pain, stiffness or swelling Skin: Denies  skin rash, easy bruising or bleeding or hives Endoc:  Denies polyuria, polydipsia , polyphagia or weight change Psych:   Denies depression, insomnia or hallucinations   Other:  All other systems negative   VS: BP (!) 140/107 (BP Location: Right Arm)   Pulse 88   Temp 98.8 F (37.1 C)   Resp 12   Ht 6' (1.829 m)   Wt 92.7 kg   SpO2 95%   BMI 27.72 kg/m      PHYSICAL EXAM    GENERAL:NAD, no fevers, chills, no weakness no fatigue HEAD: Normocephalic, atraumatic.  EYES: Pupils equal, round, reactive to light. Extraocular muscles intact. No scleral icterus.  MOUTH: Moist mucosal membrane. Dentition intact. No abscess noted.  EAR, NOSE, THROAT: Clear  without exudates. No external lesions. , +trache NECK: Supple. No thyromegaly. No nodules. No JVD.  PULMONARY: decreased breath sounds with mild rhonchi worse at bases bilaterally.  CARDIOVASCULAR: S1 and S2. Regular rate and rhythm. No murmurs, rubs, or gallops. No edema. Pedal pulses 2+ bilaterally.  GASTROINTESTINAL: Soft, nontender, nondistended. No masses. Positive bowel sounds. No hepatosplenomegaly.  MUSCULOSKELETAL: No swelling, clubbing, or edema. Range of motion full in all extremities.  NEUROLOGIC: Cranial nerves II through XII are intact. No gross focal neurological deficits. Sensation intact. Reflexes intact.  SKIN: No ulceration, lesions, rashes, or cyanosis. Skin warm and dry. Turgor intact.  PSYCHIATRIC: Mood, affect within normal limits. The patient is awake, alert and oriented x 3. Insight, judgment intact.       IMAGING     ASSESSMENT/PLAN   Acute on chronic hypoxemic respiratory failure -  due to possible tracheitis  -he has been on agumentin for aspiration  - his blood cultures have + staph epidermitis - he has not been found to have respiratory infection  -his TTE shows severe systolic CHF with EF 92-11% -patient on scopolamine, oxycodone and rubinol -tracheal aspirate today to see if its infected and get sensitivity panel - will diurese due to previous pulmonary congestion which may be part of this -s/p bronchoscopy with edematous and erythematous mucosa and bilateral mucus plugging.  BAL and therapeutic aspiration of tracheobroncheal tree was performed     Thank you for allowing me to participate in the care of this patient.   Patient/Family are satisfied with care plan and all questions have been answered.    Provider disclosure: Patient with at least one acute or chronic illness or injury that poses a threat to life or bodily function and is being managed actively during this encounter.  All of the below services have been performed independently by  signing provider:  review of prior documentation from internal and or external health records.  Review of previous and current lab results.  Interview and comprehensive assessment during patient visit today. Review of current and previous chest radiographs/CT scans. Discussion of management and test interpretation with health care team and patient/family.   This document was prepared using Dragon voice recognition software and may include unintentional dictation errors.     Ottie Glazier, M.D.  Division of Pulmonary & Critical Care Medicine

## 2022-01-15 DIAGNOSIS — A419 Sepsis, unspecified organism: Secondary | ICD-10-CM | POA: Diagnosis not present

## 2022-01-15 DIAGNOSIS — R6520 Severe sepsis without septic shock: Secondary | ICD-10-CM | POA: Diagnosis not present

## 2022-01-15 LAB — BASIC METABOLIC PANEL
Anion gap: 7 (ref 5–15)
BUN: 59 mg/dL — ABNORMAL HIGH (ref 6–20)
CO2: 25 mmol/L (ref 22–32)
Calcium: 9.3 mg/dL (ref 8.9–10.3)
Chloride: 104 mmol/L (ref 98–111)
Creatinine, Ser: 2.11 mg/dL — ABNORMAL HIGH (ref 0.61–1.24)
GFR, Estimated: 37 mL/min — ABNORMAL LOW (ref >60)
Glucose, Bld: 139 mg/dL — ABNORMAL HIGH (ref 70–99)
Potassium: 5.2 mmol/L — ABNORMAL HIGH (ref 3.5–5.1)
Sodium: 136 mmol/L (ref 135–145)

## 2022-01-15 LAB — GLUCOSE, CAPILLARY
Glucose-Capillary: 104 mg/dL — ABNORMAL HIGH (ref 70–99)
Glucose-Capillary: 114 mg/dL — ABNORMAL HIGH (ref 70–99)
Glucose-Capillary: 127 mg/dL — ABNORMAL HIGH (ref 70–99)
Glucose-Capillary: 149 mg/dL — ABNORMAL HIGH (ref 70–99)
Glucose-Capillary: 70 mg/dL (ref 70–99)
Glucose-Capillary: 83 mg/dL (ref 70–99)

## 2022-01-15 LAB — CBC
HCT: 45.2 % (ref 39.0–52.0)
Hemoglobin: 15.1 g/dL (ref 13.0–17.0)
MCH: 29.9 pg (ref 26.0–34.0)
MCHC: 33.4 g/dL (ref 30.0–36.0)
MCV: 89.5 fL (ref 80.0–100.0)
Platelets: 281 10*3/uL (ref 150–400)
RBC: 5.05 MIL/uL (ref 4.22–5.81)
RDW: 14.6 % (ref 11.5–15.5)
WBC: 21.1 10*3/uL — ABNORMAL HIGH (ref 4.0–10.5)
nRBC: 0 % (ref 0.0–0.2)

## 2022-01-15 LAB — MAGNESIUM: Magnesium: 2.6 mg/dL — ABNORMAL HIGH (ref 1.7–2.4)

## 2022-01-15 MED ORDER — SODIUM CHLORIDE 0.9 % IV SOLN: INTRAVENOUS | Status: AC

## 2022-01-15 MED ORDER — FAMOTIDINE 20 MG PO TABS
20.0000 mg | ORAL_TABLET | Freq: Every day | ORAL | Status: DC
  Administered 2022-01-16 – 2022-01-23 (×8): 20 mg
  Filled 2022-01-15 (×8): qty 1

## 2022-01-15 NOTE — Plan of Care (Signed)
Patient wife came to the floor and immediately came to the desk demanding RN come to the room "NOW."  I proceeded to the room to assist with initial request (to reposition patient as needed).  She stated - "I can help you if you're incapable."  I stated, "mam, it is difficult to do this correctly without 2 people - so yes, I would appreciate your help."   - Wife to agree to help but ultimately told me to step aside and let her do it since I was "unable."      Next she told me to get Respiratory and the Dr. To the room  - since things were obviously not being done as needed.  I proceeded to chat contacts as she requested, but then said "and the floor manager too."  Leadership informed of this request at desk - who was currently having huddle.  Wife followed me to the desk and called me out about " patient confidentiality."  I calmy explained that "I was merely talking with leadership as she requested and did not use any names."    Conclusion:  Resp and Pulmonology were informed to come to the room when able.  Resp did round, re-adjusted trach collar and deep suctioned.  Patient's wife questioned Resp about the "mis-positioning of the trach collar and it not being tight enough.  Resp explained that the collar can't be too tight (to maintain proper circulation to the neck). She then asked them to removed trach and educate her how to change out the trach.  Resp informed that was not a procedure able to teach the public - If that becomes and issue at home - patient would need to return to the hospital or have HH (if available)  come to the house.  RN changed per leadership suggestion.

## 2022-01-15 NOTE — Progress Notes (Addendum)
PROGRESS NOTE    Billy Cherry  JEH:[id number] DOB: 1971-01-17 DOA: 01/05/2022 PCP: [facility]   Brief Narrative:  51 y.o. male with medical history significant for anoxic brain injury and persistent vegetative state status post tracheostomy and PEG tube placement, chronic systolic CHF, hypertension, DVT on chronic anticoagulation, who presents with fever and tachycardia.  Patient had a thick secretion from trach tube, chest x-ray showed bilateral lower lobe atelectasis.  Patient also had a lactic acid level of 2.7. Admitted for sepsis likely from aspiration pneumonia placed on antibiotics cefepime, Flagyl.  Patient developed significant nausea vomiting on 11/4, KUB did while not see any acute changes.  Started on Reglan.  Patient wife also had GI symptoms, it appears that patient had gastroenteritis, causing nausea, subsequently caused aspiration pneumonia. Patient antibiotic was switched to Augmentin on 11/7.  Blood culture with Staph capitis in single single set likely contamination. Patient continued with full supportive care trach care respiratory care. Patient given few days of IV Lasix swelling has improved weight has returned to his baseline. Overall remains stable given his overall status and always at risk of aspiration/infection/pneumonia bedsores. Advise continued follow-up with his pulmonary team at [facility]. Pulm team consulted for increase in tracheal secretions. Patient had bronchoscopy on 11/11 showing b/l mucopurulent secretions with erythema.    Assessment & Plan:  Principal Problem:   Severe sepsis ([place]) Active Problems:   Anoxic brain injury ([place])   DVT (deep venous thrombosis) ([name])   HTN (hypertension)   Chronic systolic CHF (congestive heart failure) ([name])   Tracheostomy dependent ([name])   S/P percutaneous endoscopic gastrostomy (PEG) tube placement ([name])   Aspiration pneumonia of both lower lobes ([name])   Gastroenteritis    Severe sepsis due to aspiration  pneumonia Aspiration pneumonia Trachitis with increase tracheal secretions.  Bcx had grown Stap Capitis treated with Augmentin; continue for now. Still concerns of infection, therefore tracheal secretions ordered. Patient had bronchoscopy on 11/11 showing b/l mucopurulent secretions.  Tracheal aspirate growing GPC, GNR/Pseudomonas.  BAL cultures pending  Pulmonary congestion Acute systolic CHF, ef 37% -Diuretics to help with pulm decongestion.  Currently on Coreg, Diuretics on Hold  AKI Elevated Cr, 0.8 > 2.1. Will hold diuretics. Give gentle fluids.   Mild Hyperkalemia Still hyperkalemic.  We will discontinue Aldactone & lasix for now  Acute gastroenteritis: Improved.  w/ loose BM.  Tolerating tube feeding no nausea vomiting.Continue bolus tube feeding.  At home on continues tube feeding, continue supportive care.  Dietitian following.  Borderline blood sugar level: Blood sugar at this time is stable, continue tube feeding.   Anoxic brain injury  and dependent on Trach/Peg, able to track but nonverbal. Continue trach collar and trach suctioning, tube feeding.  Continue home atropine eye drops, Pepcid, Keppra Depakene.  DVT/PE hx: cont  home Eliquis  Hypertension BP fairly stable cont  hydralazine, Coreg     DVT prophylaxis: Place and maintain sequential compression device Start: 01/13/22 0912SCDs Code Status: Full  Family Communication:    Status is: Inpatient Remains inpatient appropriate because: On going evaluation for tracheal secretions.    Nutritional status    Signs/Symptoms: estimated needs  Interventions: Tube feeding  Body mass index is 26.97 kg/m.         Subjective: Seen and examined at bedside.  Has required suctioning couple of times this morning.  During my visit he appeared comfortable.  Patient does not have any meaningful interaction  Examination: Constitutional: Not in acute distress, chronically ill.  No meaningful  interaction. Respiratory: Mild rhonchi anteriorly Cardiovascular: Normal sinus rhythm, no rubs Abdomen: Nontender nondistended good bowel sounds Musculoskeletal: No edema noted Skin: No rashes seen Neurologic: CN 2-12 grossly intact.  And nonfocal Psychiatric: Normal judgment and insight. Alert and oriented x 3. Normal mood.  Trahc and peg in place.   Objective: Vitals:   01/14/22 2351 01/15/22 0412 01/15/22 0604 01/15/22 0753  BP: (!) 117/93 (!) 120/93  120/85  Pulse: 91 78  75  Resp: _0 Temp: 97.9 F (36.6 C) 98.9 F (37.2 C)  98.3 F (36.8 C)  TempSrc:    Axillary  SpO2: 100% 100%  99%  Weight:   90.2 kg   Height:        Intake/Output Summary (Last 24 hours) at 01/15/2022 0819 Last data filed at 01/14/2022 2130 Gross per 24 hour  Intake 1825 ml  Output 1100 ml  Net 725 ml   Filed Weights   01/13/22 0500 01/14/22 0500 01/15/22 0604  Weight: 94.3 kg 92.7 kg 90.2 kg     Data Reviewed:   CBC: Recent Labs  Lab 01/09/22 0758 01/10/22 0559 01/13/22 0633 01/14/22 0730 01/15/22 0513  WBC 6.1 5.8 8.4 10.0 21.1*  HGB 11.1* 11.9* 12.1* 14.1 15.1  HCT 34.3* 36.7* 37.8* 43.0 45.2  MCV 95.8 98.4 97.7 97.5 89.5  PLT 175 190 225 188 245   Basic Metabolic Panel: Recent Labs  Lab 01/09/22 0758 01/10/22 0559 01/13/22 0633 01/14/22 0730 01/15/22 0513  NA 138 141 138 137 136  K 4.0 4.1 5.0 5.3* 5.2*  CL 105 105 100 99 104  CO2 28 30 32 31 25  GLUCOSE 84 129* 78 84 139*  BUN 31* 29* 25* 25* 59*  CREATININE 0.71 0.72 0.74 0.86 2.11*  CALCIUM 9.4 9.3 9.5 9.9 9.3  MG 1.8 1.9  --  2.1 2.6*  PHOS 2.8 2.6  --   --   --    GFR: Estimated Creatinine Clearance: 45.5 mL/min (A) (by C-G formula based on SCr of 2.11 mg/dL (H)). Liver Function Tests: No results for input(s): "AST", "ALT", "ALKPHOS", "BILITOT", "PROT", "ALBUMIN" in the last 168 hours. No results for input(s): "LIPASE", "AMYLASE" in the last 168 hours. No results for input(s): "AMMONIA" in the  last 168 hours. Coagulation Profile: No results for input(s): "INR", "PROTIME" in the last 168 hours. Cardiac Enzymes: No results for input(s): "CKTOTAL", "CKMB", "CKMBINDEX", "TROPONINI" in the last 168 hours. BNP (last 3 results) No results for input(s): "PROBNP" in the last 8760 hours. HbA1C: No results for input(s): "HGBA1C" in the last 72 hours. CBG: Recent Labs  Lab 01/14/22 1603 01/14/22 2115 01/14/22 2352 01/15/22 0411 01/15/22 0755  GLUCAP 133* 111* 143* 83 70   Lipid Profile: No results for input(s): "CHOL", "HDL", "LDLCALC", "TRIG", "CHOLHDL", "LDLDIRECT" in the last 72 hours. Thyroid Function Tests: No results for input(s): "TSH", "T4TOTAL", "FREET4", "T3FREE", "THYROIDAB" in the last 72 hours. Anemia Panel: No results for input(s): "VITAMINB12", "FOLATE", "FERRITIN", "TIBC", "IRON", "RETICCTPCT" in the last 72 hours. Sepsis Labs: No results for input(s): "PROCALCITON", "LATICACIDVEN" in the last 168 hours.  Recent Results (from the past 240 hour(s))  Resp Panel by RT-PCR (Flu A&B, Covid) Anterior Nasal Swab     Status: None   Collection Time: 01/05/22 10:36 PM   Specimen: Anterior Nasal Swab  Result Value Ref Range Status   SARS Coronavirus 2 by RT PCR NEGATIVE NEGATIVE Final    Comment: (NOTE) SARS-CoV-2 target nucleic acids are NOT DETECTED.  The SARS-CoV-2 RNA is generally detectable in upper respiratory specimens during the acute phase of infection. The lowest concentration of SARS-CoV-2 viral copies this assay can detect is 138 copies/mL. A negative result does not preclude SARS-Cov-2 infection and should not be used as the sole basis for treatment or other patient management decisions. A negative result may occur with  improper specimen collection/handling, submission of specimen other than nasopharyngeal swab, presence of viral mutation(s) within the areas targeted by this assay, and inadequate number of viral copies(<138 copies/mL). A negative result  must be combined with clinical observations, patient history, and epidemiological information. The expected result is Negative.  Fact Sheet for Patients:  EntrepreneurPulse.com.au  Fact Sheet for Healthcare Providers:  IncredibleEmployment.be  This test is no t yet approved or cleared by the [place] FDA and  has been authorized for detection and/or diagnosis of SARS-CoV-2 by FDA under an Emergency Use Authorization (EUA). This EUA will remain  in effect (meaning this test can be used) for the duration of the COVID-19 declaration under Section 564(b)(1) of the Act, 21 U.S.C.section 360bbb-3(b)(1), unless the authorization is terminated  or revoked sooner.       Influenza A by PCR NEGATIVE NEGATIVE Final   Influenza B by PCR NEGATIVE NEGATIVE Final    Comment: (NOTE) The Xpert Xpress SARS-CoV-2/FLU/RSV plus assay is intended as an aid in the diagnosis of influenza from Nasopharyngeal swab specimens and should not be used as a sole basis for treatment. Nasal washings and aspirates are unacceptable for Xpert Xpress SARS-CoV-2/FLU/RSV testing.  Fact Sheet for Patients: EntrepreneurPulse.com.au  Fact Sheet for Healthcare Providers: IncredibleEmployment.be  This test is not yet approved or cleared by the [place] FDA and has been authorized for detection and/or diagnosis of SARS-CoV-2 by FDA under an Emergency Use Authorization (EUA). This EUA will remain in effect (meaning this test can be used) for the duration of the COVID-19 declaration under Section 564(b)(1) of the Act, 21 U.S.C. section 360bbb-3(b)(1), unless the authorization is terminated or revoked.  Performed at [facility], [address] [place], [place] [address]   Blood Culture (routine x 2)     Status: None   Collection Time: 01/05/22 10:36 PM   Specimen: BLOOD  Result Value Ref Range Status   Specimen Description  BLOOD BLOOD LEFT ARM  Final   Special Requests   Final    BOTTLES DRAWN AEROBIC AND ANAEROBIC Blood Culture adequate volume   Culture   Final    NO GROWTH 5 DAYS Performed at [facility], [address], [place], [place] [address]    Report Status 01/10/2022 FINAL  Final  Blood Culture (routine x 2)     Status: Abnormal   Collection Time: 01/05/22 10:36 PM   Specimen: BLOOD  Result Value Ref Range Status   Specimen Description   Final    BLOOD BLOOD RIGHT ARM Performed at [facility], [address] [place], [place] [address]    Special Requests   Final    BOTTLES DRAWN AEROBIC AND ANAEROBIC Blood Culture results may not be optimal due to an inadequate volume of blood received in culture bottles Performed at [facility], [address] [place], [place] [address]    Culture  Setup Time   Final    GRAM POSITIVE COCCI ANAEROBIC BOTTLE ONLY CRITICAL RESULT CALLED TO, READ BACK BY AND VERIFIED WITH: RAQUEL RODRIQUEZ _0  01/06/22 LFD    Culture (A)  Final    STAPHYLOCOCCUS CAPITIS  THE SIGNIFICANCE OF ISOLATING THIS ORGANISM FROM A SINGLE SET OF BLOOD CULTURES WHEN MULTIPLE SETS ARE DRAWN IS UNCERTAIN. PLEASE NOTIFY THE MICROBIOLOGY DEPARTMENT WITHIN ONE WEEK IF SPECIATION AND SENSITIVITIES ARE REQUIRED. Performed at [facility], [place] [address] [place], [place] [address]    Report Status 01/08/2022 FINAL  Final  Urine Culture     Status: Abnormal   Collection Time: 01/05/22 10:36 PM   Specimen: In/Out Cath Urine  Result Value Ref Range Status   Specimen Description   Final    IN/OUT CATH URINE Performed at [facility], [address] [place], [place] [address]    Special Requests   Final    NONE Performed at [facility], [address] [place], [place] [address]    Culture MULTIPLE SPECIES PRESENT, SUGGEST RECOLLECTION (A)  Final   Report Status 01/07/2022 FINAL  Final  Blood Culture ID Panel (Reflexed)      Status: Abnormal   Collection Time: 01/05/22 10:36 PM  Result Value Ref Range Status   Enterococcus faecalis NOT DETECTED NOT DETECTED Final   Enterococcus Faecium NOT DETECTED NOT DETECTED Final   Listeria monocytogenes NOT DETECTED NOT DETECTED Final   Staphylococcus species DETECTED (A) NOT DETECTED Final    Comment: CRITICAL RESULT CALLED TO, READ BACK BY AND VERIFIED WITH: RAQUEL RODRIQUEZ _0  01/06/22 LFD    Staphylococcus aureus (BCID) NOT DETECTED NOT DETECTED Final   Staphylococcus epidermidis NOT DETECTED NOT DETECTED Final   Staphylococcus lugdunensis NOT DETECTED NOT DETECTED Final   Streptococcus species NOT DETECTED NOT DETECTED Final   Streptococcus agalactiae NOT DETECTED NOT DETECTED Final   Streptococcus pneumoniae NOT DETECTED NOT DETECTED Final   Streptococcus pyogenes NOT DETECTED NOT DETECTED Final   A.calcoaceticus-baumannii NOT DETECTED NOT DETECTED Final   Bacteroides fragilis NOT DETECTED NOT DETECTED Final   Enterobacterales NOT DETECTED NOT DETECTED Final   Enterobacter cloacae complex NOT DETECTED NOT DETECTED Final   Escherichia coli NOT DETECTED NOT DETECTED Final   Klebsiella aerogenes NOT DETECTED NOT DETECTED Final   Klebsiella oxytoca NOT DETECTED NOT DETECTED Final   Klebsiella pneumoniae NOT DETECTED NOT DETECTED Final   Proteus species NOT DETECTED NOT DETECTED Final   Salmonella species NOT DETECTED NOT DETECTED Final   Serratia marcescens NOT DETECTED NOT DETECTED Final   Haemophilus influenzae NOT DETECTED NOT DETECTED Final   Neisseria meningitidis NOT DETECTED NOT DETECTED Final   Pseudomonas aeruginosa NOT DETECTED NOT DETECTED Final   Stenotrophomonas maltophilia NOT DETECTED NOT DETECTED Final   Candida albicans NOT DETECTED NOT DETECTED Final   Candida auris NOT DETECTED NOT DETECTED Final   Candida glabrata NOT DETECTED NOT DETECTED Final   Candida krusei NOT DETECTED NOT DETECTED Final   Candida parapsilosis NOT DETECTED NOT  DETECTED Final   Candida tropicalis NOT DETECTED NOT DETECTED Final   Cryptococcus neoformans/gattii NOT DETECTED NOT DETECTED Final    Comment: Performed at [facility], [address] [place], [place] [address]  MRSA Next Gen by PCR, Nasal     Status: None   Collection Time: 01/06/22  1:38 AM   Specimen: Nasal Mucosa; Nasal Swab  Result Value Ref Range Status   MRSA by PCR Next Gen NOT DETECTED NOT DETECTED Final    Comment: (NOTE) The GeneXpert MRSA Assay (FDA approved for NASAL specimens only), is one component of a comprehensive MRSA colonization surveillance program. It is not intended to diagnose MRSA infection nor to guide or monitor treatment for MRSA  infections. Test performance is not FDA approved in patients less than 79 years old. Performed at [facility], [address] [place], [place] [address]   Culture, Respiratory w Gram Stain     Status: None (Preliminary result)   Collection Time: 01/12/22  4:15 PM   Specimen: Tracheal Aspirate; Respiratory  Result Value Ref Range Status   Specimen Description   Final    TRACHEAL ASPIRATE Performed at [facility], [address] [place], [place] [address]    Special Requests   Final    NONE Performed at [facility], [address] [place], [place] [address]    Gram Stain   Final    RARE WBC PRESENT, PREDOMINANTLY MONONUCLEAR FEW GRAM POSITIVE COCCI IN PAIRS FEW GRAM NEGATIVE RODS    Culture   Final    MODERATE PSEUDOMONAS AERUGINOSA CULTURE REINCUBATED FOR BETTER GROWTH Performed at [facility], [place] [address] [place], [place] [address]    Report Status PENDING  Incomplete         Radiology Studies: No results found.      Scheduled Meds:  atropine  2 drop Sublingual QID   carvedilol  3.125 mg Per Tube BID WC   Chlorhexidine Gluconate Cloth  6 each Topical Daily   clonazePAM  0.5 mg Per Tube BID   famotidine  20 mg Per Tube BID   feeding supplement  (OSMOLITE 1.5 CAL)  355 mL Per Tube QID   feeding supplement (PROSource TF20)  60 mL Per Tube Daily   fiber  1 packet Per Tube BID   free water  200 mL Per Tube Q6H   furosemide  40 mg Intravenous Daily   glycopyrrolate  1 mg Per Tube TID   hydrALAZINE  50 mg Per Tube TID   levETIRAcetam  750 mg Per Tube BID   mouth rinse  15 mL Mouth Rinse 4 times per day   pantoprazole (PROTONIX) IV  40 mg Intravenous Daily   scopolamine  1 patch Transdermal Q72H   sodium chloride flush  3 mL Intravenous Q12H   valproic acid  750 mg Per Tube TID   Continuous Infusions:   LOS: 9 days   Time spent= 35 mins    Keegan Bensch Arsenio Loader, MD [facility]  If 7PM-7AM, please contact night-coverage  01/15/2022, 8:19 AM

## 2022-01-16 DIAGNOSIS — A419 Sepsis, unspecified organism: Secondary | ICD-10-CM | POA: Diagnosis not present

## 2022-01-16 DIAGNOSIS — R6520 Severe sepsis without septic shock: Secondary | ICD-10-CM | POA: Diagnosis not present

## 2022-01-16 LAB — BASIC METABOLIC PANEL
Anion gap: 7 (ref 5–15)
BUN: 32 mg/dL — ABNORMAL HIGH (ref 6–20)
CO2: 32 mmol/L (ref 22–32)
Calcium: 9.4 mg/dL (ref 8.9–10.3)
Chloride: 99 mmol/L (ref 98–111)
Creatinine, Ser: 0.92 mg/dL (ref 0.61–1.24)
GFR, Estimated: >60 mL/min (ref >60)
Glucose, Bld: 95 mg/dL (ref 70–99)
Potassium: 4.8 mmol/L (ref 3.5–5.1)
Sodium: 138 mmol/L (ref 135–145)

## 2022-01-16 LAB — GLUCOSE, CAPILLARY
Glucose-Capillary: 104 mg/dL — ABNORMAL HIGH (ref 70–99)
Glucose-Capillary: 87 mg/dL (ref 70–99)
Glucose-Capillary: 93 mg/dL (ref 70–99)
Glucose-Capillary: 108 mg/dL — ABNORMAL HIGH (ref 70–99)
Glucose-Capillary: 79 mg/dL (ref 70–99)

## 2022-01-16 LAB — CULTURE, RESPIRATORY W GRAM STAIN

## 2022-01-16 LAB — CBC
HCT: 37.7 % — ABNORMAL LOW (ref 39.0–52.0)
Hemoglobin: 12.1 g/dL — ABNORMAL LOW (ref 13.0–17.0)
MCH: 31.2 pg (ref 26.0–34.0)
MCHC: 32.1 g/dL (ref 30.0–36.0)
MCV: 97.2 fL (ref 80.0–100.0)
Platelets: 258 10*3/uL (ref 150–400)
RBC: 3.88 MIL/uL — ABNORMAL LOW (ref 4.22–5.81)
RDW: 13.7 % (ref 11.5–15.5)
WBC: 6.3 10*3/uL (ref 4.0–10.5)
nRBC: 0 % (ref 0.0–0.2)

## 2022-01-16 LAB — MAGNESIUM: Magnesium: 2.1 mg/dL (ref 1.7–2.4)

## 2022-01-16 MED ORDER — PIPERACILLIN-TAZOBACTAM 3.375 G IVPB
3.3750 g | Freq: Three times a day (TID) | INTRAVENOUS | Status: DC
  Administered 2022-01-16 – 2022-01-17 (×3): 3.375 g via INTRAVENOUS
  Filled 2022-01-16 (×3): qty 50

## 2022-01-16 NOTE — Progress Notes (Addendum)
PROGRESS NOTE    Billy Cherry  JXB:[id number] DOB: March 23, 1970 DOA: 01/05/2022 PCP: [facility]   Brief Narrative:  51 y.o. male with medical history significant for anoxic brain injury and persistent vegetative state status post tracheostomy and PEG tube placement, chronic systolic CHF, hypertension, DVT on chronic anticoagulation, who presents with fever and tachycardia.  Patient had a thick secretion from trach tube, chest x-ray showed bilateral lower lobe atelectasis.  Patient also had a lactic acid level of 2.7. Admitted for sepsis likely from aspiration pneumonia placed on antibiotics cefepime, Flagyl.  Patient developed significant nausea vomiting on 11/4, KUB did while not see any acute changes.  Started on Reglan.  Patient wife also had GI symptoms, it appears that patient had gastroenteritis, causing nausea, subsequently caused aspiration pneumonia. Patient antibiotic was switched to Augmentin on 11/7.  Blood culture with Staph capitis in single single set likely contamination. Patient continued with full supportive care trach care respiratory care. Patient given few days of IV Lasix swelling has improved weight has returned to his baseline. Overall remains stable given his overall status and always at risk of aspiration/infection/pneumonia bedsores. Advise continued follow-up with his pulmonary team at [facility]. Pulm team consulted for increase in tracheal secretions. Patient had bronchoscopy on 11/11 showing b/l mucopurulent secretions with erythema.    Assessment & Plan:  Principal Problem:   Severe sepsis ([place]) Active Problems:   Anoxic brain injury ([place])   DVT (deep venous thrombosis) ([name])   HTN (hypertension)   Chronic systolic CHF (congestive heart failure) ([name])   Tracheostomy dependent ([name])   S/P percutaneous endoscopic gastrostomy (PEG) tube placement ([name])   Aspiration pneumonia of both lower lobes ([name])   Gastroenteritis    Severe sepsis due to aspiration  pneumonia Aspiration pneumonia Trachitis with increase tracheal secretions.  Bcx had grown Stap Capitis treated with Augmentin. Still concerns of infection, therefore tracheal secretions cultures ordered. Patient had bronchoscopy on 11/11 showing b/l mucopurulent secretions.  Tracheal aspirate growing GPC, GNR/Psuedomonas.  BAL cultures sent. Starting Zosyn.   Pulmonary congestion Acute systolic CHF, ef 13% Initially was on diuretics to help with decongestion but not due to the signs of dehydration they are on hold.  Continue Coreg.  AKI; resolved.  Elevated Cr, 0.8 > 2.1 > 0.92. Will hold diuretics.   Mild Hyperkalemia Resolved  Acute gastroenteritis: Improved.  w/ loose BM.  Tolerating tube feeding no nausea vomiting.Continue bolus tube feeding.  At home on continues tube feeding, continue supportive care.  Dietitian following.  Borderline blood sugar level: Blood sugar at this time is stable, continue tube feeding.   Anoxic brain injury  and dependent on Trach/Peg, able to track but nonverbal. Continue trach collar and trach suctioning, tube feeding.  Continue home atropine eye drops, Pepcid, Keppra Depakene.  DVT/PE hx: cont  home Eliquis  Hypertension BP fairly stable cont  hydralazine, Coreg     DVT prophylaxis: Place and maintain sequential compression device Start: 01/13/22 0912SCDs Code Status: Full  Family Communication:  Spoke with [name]  Status is: Inpatient Remains inpatient appropriate because: On going management for tracheal secretions.    Nutritional status   Signs/Symptoms: estimated needs  Interventions: Tube feeding  Body mass index is 27.81 kg/m.      Subjective: Resting comfortably.  NO acute events overnight.  Still having clear secretions.   Examination: Constitutional: Not in acute distress. Chronically ill Respiratory: some secretions around trach, but lung overall clear.  Cardiovascular: Normal sinus rhythm, no rubs Abdomen:  Nontender nondistended good bowel sounds Musculoskeletal: No edema noted Skin: No rashes seen Neurologic: unable to assess. No meaningful interaction.  Psychiatric: unable to assess.    Trahc and peg in place.   Objective: Vitals:   01/16/22 0136 01/16/22 0351 01/16/22 0500 01/16/22 0740  BP:  (!) 133/98  (!) 134/96  Pulse:  92  79  Resp:  18  18  Temp:  98.2 F (36.8 C)  98 F (36.7 C)  TempSrc:    Axillary  SpO2: 100% 96%  96%  Weight:   93 kg   Height:        Intake/Output Summary (Last 24 hours) at 01/16/2022 0954 Last data filed at 01/16/2022 0541 Gross per 24 hour  Intake 1556.73 ml  Output 2200 ml  Net -643.27 ml   Filed Weights   01/14/22 0500 01/15/22 0604 01/16/22 0500  Weight: 92.7 kg 90.2 kg 93 kg     Data Reviewed:   CBC: Recent Labs  Lab 01/10/22 0559 01/13/22 0633 01/14/22 0730 01/15/22 0513 01/16/22 0658  WBC 5.8 8.4 10.0 21.1* 6.3  HGB 11.9* 12.1* 14.1 15.1 12.1*  HCT 36.7* 37.8* 43.0 45.2 37.7*  MCV 98.4 97.7 97.5 89.5 97.2  PLT 190 225 188 281 127   Basic Metabolic Panel: Recent Labs  Lab 01/10/22 0559 01/13/22 0633 01/14/22 0730 01/15/22 0513 01/16/22 0658  NA 141 138 137 136 138  K 4.1 5.0 5.3* 5.2* 4.8  CL 105 100 99 104 99  CO2 30 32 31 25 32  GLUCOSE 129* 78 84 139* 95  BUN 29* 25* 25* 59* 32*  CREATININE 0.72 0.74 0.86 2.11* 0.92  CALCIUM 9.3 9.5 9.9 9.3 9.4  MG 1.9  --  2.1 2.6* 2.1  PHOS 2.6  --   --   --   --    GFR: Estimated Creatinine Clearance: 104.3 mL/min (by C-G formula based on SCr of 0.92 mg/dL). Liver Function Tests: No results for input(s): "AST", "ALT", "ALKPHOS", "BILITOT", "PROT", "ALBUMIN" in the last 168 hours. No results for input(s): "LIPASE", "AMYLASE" in the last 168 hours. No results for input(s): "AMMONIA" in the last 168 hours. Coagulation Profile: No results for input(s): "INR", "PROTIME" in the last 168 hours. Cardiac Enzymes: No results for input(s): "CKTOTAL", "CKMB", "CKMBINDEX",  "TROPONINI" in the last 168 hours. BNP (last 3 results) No results for input(s): "PROBNP" in the last 8760 hours. HbA1C: No results for input(s): "HGBA1C" in the last 72 hours. CBG: Recent Labs  Lab 01/15/22 1633 01/15/22 2145 01/15/22 2342 01/16/22 0347 01/16/22 0752  GLUCAP 149* 114* 127* 104* 87   Lipid Profile: No results for input(s): "CHOL", "HDL", "LDLCALC", "TRIG", "CHOLHDL", "LDLDIRECT" in the last 72 hours. Thyroid Function Tests: No results for input(s): "TSH", "T4TOTAL", "FREET4", "T3FREE", "THYROIDAB" in the last 72 hours. Anemia Panel: No results for input(s): "VITAMINB12", "FOLATE", "FERRITIN", "TIBC", "IRON", "RETICCTPCT" in the last 72 hours. Sepsis Labs: No results for input(s): "PROCALCITON", "LATICACIDVEN" in the last 168 hours.  Recent Results (from the past 240 hour(s))  Culture, Respiratory w Gram Stain     Status: None (Preliminary result)   Collection Time: 01/12/22  4:15 PM   Specimen: Tracheal Aspirate; Respiratory  Result Value Ref Range Status   Specimen Description   Final    TRACHEAL ASPIRATE Performed at [facility], [address], [place], [place] [address]    Special Requests   Final    NONE Performed at [facility], [place],  [place] [address]    Gram Stain   Final    RARE WBC PRESENT, PREDOMINANTLY MONONUCLEAR FEW GRAM POSITIVE COCCI IN PAIRS FEW GRAM NEGATIVE RODS    Culture   Final    MODERATE PSEUDOMONAS AERUGINOSA SUSCEPTIBILITIES TO FOLLOW Performed at [facility], [place] [address] [place], [place] [address]    Report Status PENDING  Incomplete         Radiology Studies: No results found.      Scheduled Meds:  atropine  2 drop Sublingual QID   carvedilol  3.125 mg Per Tube BID WC   Chlorhexidine Gluconate Cloth  6 each Topical Daily   clonazePAM  0.5 mg Per Tube BID   famotidine  20 mg Per Tube Daily   feeding supplement (OSMOLITE 1.5 CAL)  355 mL Per Tube QID    feeding supplement (PROSource TF20)  60 mL Per Tube Daily   fiber  1 packet Per Tube BID   free water  200 mL Per Tube Q6H   glycopyrrolate  1 mg Per Tube TID   hydrALAZINE  50 mg Per Tube TID   levETIRAcetam  750 mg Per Tube BID   mouth rinse  15 mL Mouth Rinse 4 times per day   pantoprazole (PROTONIX) IV  40 mg Intravenous Daily   scopolamine  1 patch Transdermal Q72H   sodium chloride flush  3 mL Intravenous Q12H   valproic acid  750 mg Per Tube TID   Continuous Infusions:  sodium chloride 75 mL/hr at 01/16/22 0149     LOS: 10 days   Time spent= 35 mins    Nelida Mandarino Arsenio Loader, MD [facility]  If 7PM-7AM, please contact night-coverage  01/16/2022, 9:54 AM

## 2022-01-17 DIAGNOSIS — R6520 Severe sepsis without septic shock: Secondary | ICD-10-CM | POA: Diagnosis not present

## 2022-01-17 DIAGNOSIS — A419 Sepsis, unspecified organism: Secondary | ICD-10-CM | POA: Diagnosis not present

## 2022-01-17 LAB — GLUCOSE, CAPILLARY
Glucose-Capillary: 115 mg/dL — ABNORMAL HIGH (ref 70–99)
Glucose-Capillary: 118 mg/dL — ABNORMAL HIGH (ref 70–99)
Glucose-Capillary: 78 mg/dL (ref 70–99)
Glucose-Capillary: 79 mg/dL (ref 70–99)
Glucose-Capillary: 84 mg/dL (ref 70–99)
Glucose-Capillary: 87 mg/dL (ref 70–99)

## 2022-01-17 LAB — BASIC METABOLIC PANEL
Anion gap: 11 (ref 5–15)
BUN: 31 mg/dL — ABNORMAL HIGH (ref 6–20)
CO2: 27 mmol/L (ref 22–32)
Calcium: 9.8 mg/dL (ref 8.9–10.3)
Chloride: 102 mmol/L (ref 98–111)
Creatinine, Ser: 0.84 mg/dL (ref 0.61–1.24)
GFR, Estimated: >60 mL/min (ref >60)
Glucose, Bld: 98 mg/dL (ref 70–99)
Potassium: 5.7 mmol/L — ABNORMAL HIGH (ref 3.5–5.1)
Sodium: 140 mmol/L (ref 135–145)

## 2022-01-17 LAB — CBC
HCT: 42 % (ref 39.0–52.0)
Hemoglobin: 13.7 g/dL (ref 13.0–17.0)
MCH: 31.9 pg (ref 26.0–34.0)
MCHC: 32.6 g/dL (ref 30.0–36.0)
MCV: 97.7 fL (ref 80.0–100.0)
Platelets: 141 10*3/uL — ABNORMAL LOW (ref 150–400)
RBC: 4.3 MIL/uL (ref 4.22–5.81)
RDW: 13.5 % (ref 11.5–15.5)
WBC: 5.9 10*3/uL (ref 4.0–10.5)
nRBC: 0 % (ref 0.0–0.2)

## 2022-01-17 LAB — MAGNESIUM: Magnesium: 2.2 mg/dL (ref 1.7–2.4)

## 2022-01-17 MED ORDER — FUROSEMIDE 10 MG/ML IJ SOLN
40.0000 mg | Freq: Every day | INTRAMUSCULAR | Status: DC
  Administered 2022-01-17 – 2022-01-19 (×3): 40 mg via INTRAVENOUS
  Filled 2022-01-17 (×3): qty 4

## 2022-01-17 MED ORDER — CIPROFLOXACIN HCL 500 MG PO TABS
250.0000 mg | ORAL_TABLET | Freq: Two times a day (BID) | ORAL | Status: DC
  Administered 2022-01-17 – 2022-01-23 (×13): 250 mg via ORAL
  Filled 2022-01-17 (×14): qty 0.5

## 2022-01-17 MED ORDER — APIXABAN 5 MG PO TABS
5.0000 mg | ORAL_TABLET | Freq: Two times a day (BID) | ORAL | Status: DC
  Administered 2022-01-17 – 2022-01-23 (×14): 5 mg via ORAL
  Filled 2022-01-17 (×14): qty 1

## 2022-01-17 MED ORDER — SODIUM ZIRCONIUM CYCLOSILICATE 5 G PO PACK
5.0000 g | PACK | Freq: Once | ORAL | Status: AC
  Administered 2022-01-17: 5 g via ORAL
  Filled 2022-01-17: qty 1

## 2022-01-17 NOTE — Progress Notes (Signed)
Nutrition Follow-up  DOCUMENTATION CODES:   Not applicable  INTERVENTION:   -TF via g-tube:   355 ml (1.5 cartons) Osmolite 1.5 QID   60 ml Prosource TF daily   200 ml free water QID   Regimen provides 2210 kcals, and 109 grams protein 1086 ml free water daily. Total free water: 1886 ml daily  NUTRITION DIAGNOSIS:   Increased nutrient needs related to acute illness as evidenced by estimated needs.  Ongoing  GOAL:   Patient will meet greater than or equal to 90% of their needs  Met with TF  MONITOR:   TF tolerance, Labs, Weight trends, I & O's  REASON FOR ASSESSMENT:   Consult Enteral/tube feeding initiation and management  ASSESSMENT:   51 y.o. male with medical history of anoxic brain injury and persistent vegetative state s/p tracheostomy and PEG tube placement, chronic systolic CHF, HTN, and DVT on chronic anticoagulation. He presented to the ED from home due to fever, tachycardia, and thick secretions from his trach.  11/11- s/p bronch- revealed bilateral  mucopurulent secretions   Reviewed I/O's: +115 ml x 24 hours and -2.5 L since admission  Pt receiving trach care at time of visit.   Pt with improved tolerance of TF secondary to reglan.    Plan to discharge home in care of wife and private duty nursing.  Labs reviewed: CBGS: 78-115.   Diet Order:   Diet Order             Diet NPO time specified  Diet effective midnight                   EDUCATION NEEDS:   No education needs have been identified at this time  Skin:  Skin Assessment: Reviewed RN Assessment  Last BM:  01/10/22  Height:   Ht Readings from Last 1 Encounters:  01/05/22 6' (1.829 m)    Weight:   Wt Readings from Last 1 Encounters:  01/17/22 95.8 kg    Ideal Body Weight:  80.9 kg  BMI:  Body mass index is 28.64 kg/m.  Estimated Nutritional Needs:   Kcal:  1900-2100 kcal  Protein:  90-105 grams  Fluid:  >/= 2.1 L/day    Loistine Chance, RD, LDN,  CDCES Registered Dietitian II Certified Diabetes Care and Education Specialist Please refer to [facility] for RD and/or RD on-call/weekend/after hours pager

## 2022-01-17 NOTE — Progress Notes (Signed)
PROGRESS NOTE    Billy Cherry  YDX:[id number] DOB: 08-16-1970 DOA: 01/05/2022 PCP: [facility]    Brief Narrative:  51 y.o. male with medical history significant for anoxic brain injury and persistent vegetative state status post tracheostomy and PEG tube placement, chronic systolic CHF, hypertension, DVT on chronic anticoagulation, who presents with fever and tachycardia.  Patient had a thick secretion from trach tube, chest x-ray showed bilateral lower lobe atelectasis.  Patient also had a lactic acid level of 2.7. Admitted for sepsis likely from aspiration pneumonia placed on antibiotics cefepime, Flagyl.  Patient developed significant nausea vomiting on 11/4, KUB did while not see any acute changes.  Started on Reglan.  Patient wife also had GI symptoms, it appears that patient had gastroenteritis, causing nausea, subsequently caused aspiration pneumonia. Patient antibiotic was switched to Augmentin on 11/7.  Blood culture with Staph capitis in single single set likely contamination. Patient continued with full supportive care trach care respiratory care. Patient given few days of IV Lasix swelling has improved weight has returned to his baseline. Overall remains stable given his overall status and always at risk of aspiration/infection/pneumonia bedsores. Advise continued follow-up with his pulmonary team at [facility]. Pulm team consulted for increase in tracheal secretions. Patient had bronchoscopy on 11/11 showing b/l mucopurulent secretions with erythema.    Assessment & Plan:   Principal Problem:   Severe sepsis ([place]) Active Problems:   Anoxic brain injury ([place])   DVT (deep venous thrombosis) ([name])   HTN (hypertension)   Chronic systolic CHF (congestive heart failure) ([name])   Tracheostomy dependent ([name])   S/P percutaneous endoscopic gastrostomy (PEG) tube placement ([name])   Aspiration pneumonia of both lower lobes ([name])   Gastroenteritis  Severe sepsis due to aspiration  pneumonia Aspiration pneumonia Trachitis with increase tracheal secretions.  Bcx had grown Stap Capitis treated with Augmentin. Still concerns of infection, therefore tracheal secretions cultures ordered. Patient had bronchoscopy on 11/11 showing b/l mucopurulent secretions.  Tracheal aspirate growing GPC, GNR/Psuedomonas.  BAL cultures sent. Starting Zosyn. Follow up BAL cultures   Pulmonary congestion Acute systolic CHF, ef 72% Initially was on diuretics to help with decongestion but not due to the signs of dehydration they are on hold.  Continue Coreg.   AKI; resolved.  Elevated Cr, 0.8 > 2.1 > 0.92. Will hold diuretics.    Mild Hyperkalemia Resolved   Acute gastroenteritis: Improved.  w/ loose BM.  Tolerating tube feeding no nausea vomiting.Continue bolus tube feeding.  At home on continues tube feeding, continue supportive care.  Dietitian following.   Borderline blood sugar level: Blood sugar at this time is stable, continue tube feeding.    Anoxic brain injury  and dependent on Trach/Peg, able to track but nonverbal. Continue trach collar and trach suctioning, tube feeding.  Continue home atropine eye drops, Pepcid, Keppra Depakene.   DVT/PE hx: cont  home Eliquis   Hypertension BP fairly stable cont  hydralazine, Coreg  DVT prophylaxis: SCDs, eliquyis Code Status: FULL Family Communication:None today Disposition Plan: Status is: Inpatient Remains inpatient appropriate because: Aspiration pnuemonia.  Increased secretion burden   Level of care: Telemetry Cardiac  Consultants:  Pulmonary  Procedures:  Bronchoscopy  Antimicrobials: Zosyn   Subjective: Seen and examined.  Visibly NAD  Objective: Vitals:   01/17/22 0519 01/17/22 0737 01/17/22 0739 01/17/22 1151  BP: 121/81 117/87 112/64 (!) 116/97  Pulse: 78 80 85 80  Resp:  _0 Temp: 98.9 F (37.2 C) 98.4 F (36.9 C) 97.6  F (36.4 C) 98.8 F (37.1 C)  TempSrc: Axillary  Axillary   SpO2: 100% 99%  93% 99%  Weight: 95.8 kg     Height:        Intake/Output Summary (Last 24 hours) at 01/17/2022 1548 Last data filed at 01/17/2022 0739 Gross per 24 hour  Intake 0 ml  Output 350 ml  Net -350 ml   Filed Weights   01/15/22 0604 01/16/22 0500 01/17/22 0519  Weight: 90.2 kg 93 kg 95.8 kg    Examination:  General exam: Frail and chronically ill. Non verbal Respiratory system: Clear to auscultation. Respiratory effort normal. Cardiovascular system: S1S2, RRR, no murmur Gastrointestinal system: Soft ND, +Peg Central nervous system: Oriented x 0 Extremities: Symmetric 0 x 5 power. Skin: No rashes, lesions or ulcers Psychiatry: Unable to assess    Data Reviewed: I have personally reviewed following labs and imaging studies  CBC: Recent Labs  Lab 01/13/22 0633 01/14/22 0730 01/15/22 0513 01/16/22 0658 01/17/22 0552  WBC 8.4 10.0 21.1* 6.3 5.9  HGB 12.1* 14.1 15.1 12.1* 13.7  HCT 37.8* 43.0 45.2 37.7* 42.0  MCV 97.7 97.5 89.5 97.2 97.7  PLT 225 188 281 258 130*   Basic Metabolic Panel: Recent Labs  Lab 01/13/22 0633 01/14/22 0730 01/15/22 0513 01/16/22 0658 01/17/22 0552  NA 138 137 136 138 140  K 5.0 5.3* 5.2* 4.8 5.7*  CL 100 99 104 99 102  CO2 32 31 25 32 27  GLUCOSE 78 84 139* 95 98  BUN 25* 25* 59* 32* 31*  CREATININE 0.74 0.86 2.11* 0.92 0.84  CALCIUM 9.5 9.9 9.3 9.4 9.8  MG  --  2.1 2.6* 2.1 2.2   GFR: Estimated Creatinine Clearance: 124.9 mL/min (by C-G formula based on SCr of 0.84 mg/dL). Liver Function Tests: No results for input(s): "AST", "ALT", "ALKPHOS", "BILITOT", "PROT", "ALBUMIN" in the last 168 hours. No results for input(s): "LIPASE", "AMYLASE" in the last 168 hours. No results for input(s): "AMMONIA" in the last 168 hours. Coagulation Profile: No results for input(s): "INR", "PROTIME" in the last 168 hours. Cardiac Enzymes: No results for input(s): "CKTOTAL", "CKMB", "CKMBINDEX", "TROPONINI" in the last 168 hours. BNP (last 3  results) No results for input(s): "PROBNP" in the last 8760 hours. HbA1C: No results for input(s): "HGBA1C" in the last 72 hours. CBG: Recent Labs  Lab 01/16/22 2144 01/17/22 0053 01/17/22 0339 01/17/22 0736 01/17/22 1149  GLUCAP 79 115* 84 78 79   Lipid Profile: No results for input(s): "CHOL", "HDL", "LDLCALC", "TRIG", "CHOLHDL", "LDLDIRECT" in the last 72 hours. Thyroid Function Tests: No results for input(s): "TSH", "T4TOTAL", "FREET4", "T3FREE", "THYROIDAB" in the last 72 hours. Anemia Panel: No results for input(s): "VITAMINB12", "FOLATE", "FERRITIN", "TIBC", "IRON", "RETICCTPCT" in the last 72 hours. Sepsis Labs: No results for input(s): "PROCALCITON", "LATICACIDVEN" in the last 168 hours.  Recent Results (from the past 240 hour(s))  Culture, Respiratory w Gram Stain     Status: None   Collection Time: 01/12/22  4:15 PM   Specimen: Tracheal Aspirate; Respiratory  Result Value Ref Range Status   Specimen Description   Final    TRACHEAL ASPIRATE Performed at [facility], [address] [place], [place] [address]    Special Requests   Final    NONE Performed at [facility], [place], [place] [address]    Gram Stain   Final    RARE WBC PRESENT, PREDOMINANTLY MONONUCLEAR FEW GRAM POSITIVE COCCI IN PAIRS FEW GRAM NEGATIVE  RODS Performed at [facility], [place] [address], [place], [place] [address]    Culture MODERATE PSEUDOMONAS AERUGINOSA  Final   Report Status 01/16/2022 FINAL  Final   Organism ID, Bacteria PSEUDOMONAS AERUGINOSA  Final      Susceptibility   Pseudomonas aeruginosa - MIC*    CEFTAZIDIME <=1 SENSITIVE Sensitive     CIPROFLOXACIN <=0.25 SENSITIVE Sensitive     GENTAMICIN <=1 SENSITIVE Sensitive     IMIPENEM 2 SENSITIVE Sensitive     PIP/TAZO 16 SENSITIVE Sensitive     CEFEPIME 2 SENSITIVE Sensitive     * MODERATE PSEUDOMONAS AERUGINOSA         Radiology Studies: No results  found.      Scheduled Meds:  apixaban  5 mg Oral BID   atropine  2 drop Sublingual QID   carvedilol  3.125 mg Per Tube BID WC   Chlorhexidine Gluconate Cloth  6 each Topical Daily   ciprofloxacin  250 mg Oral BID   clonazePAM  0.5 mg Per Tube BID   famotidine  20 mg Per Tube Daily   feeding supplement (OSMOLITE 1.5 CAL)  355 mL Per Tube QID   feeding supplement (PROSource TF20)  60 mL Per Tube Daily   fiber  1 packet Per Tube BID   free water  200 mL Per Tube Q6H   furosemide  40 mg Intravenous Daily   glycopyrrolate  1 mg Per Tube TID   hydrALAZINE  50 mg Per Tube TID   levETIRAcetam  750 mg Per Tube BID   mouth rinse  15 mL Mouth Rinse 4 times per day   pantoprazole (PROTONIX) IV  40 mg Intravenous Daily   scopolamine  1 patch Transdermal Q72H   sodium chloride flush  3 mL Intravenous Q12H   valproic acid  750 mg Per Tube TID   Continuous Infusions:   LOS: 11 days      Sidney Ace, MD [facility]   If 7PM-7AM, please contact night-coverage  01/17/2022, 3:48 PM

## 2022-01-17 NOTE — Progress Notes (Signed)
PULMONOLOGY         Date: 01/17/2022,   MRN# [id number] Billy Cherry 1970-12-01     AdmissionWeight: 108.9 kg                 CurrentWeight: 95.8 kg  Referring provider: Gonzales:   Acute on chronic hypoxemic respiratory failure    HISTORY OF PRESENT ILLNESS   Billy Cherry is a 51 y.o. male with medical history significant for anoxic brain injury and persistent vegetative state status post tracheostomy and PEG tube placement, chronic systolic CHF, hypertension, DVT on chronic anticoagulation, who presents with fever and tachycardia.Patient was brought in from home due to fever, tachycardia, and thick secretions from his tracheostomy tube.  No additional history given. Patient was febrile on arrival with tachypnea and tacchycardia and hypoxemia.  CBC notable for white count of 17, mild anemia with hemoglobin 12.2, CMP was unremarkable.  Lactic acid was elevated at 2.5, chest x-ray showed low lung volumes and no obvious infiltrates.  Blood and urine cultures were obtained.  Respiratory viral panel was obtained.  He was given a dose of azithromycin, cefepime, vancomycin, 1 L NS bolus, and admitted for further management. He had CT chest abd and pelvis in June 2023 with findings of pulmonary interstitial edema bilaterally at moderate severity, he had CXR on arrival with findings of poor inspiratory effort, mild pulmonary edema.   01/13/22- patient with persistent high volume mucopuruent secretions despite antibiotics.  Will perform airway inspection and bronchial washings for microbiology today.  01/14/22 - patient doing well on 5L/min seems to be coughing less with less secretions.  Reviewed medical plan with Dr Reesa Chew today.  01/17/22- patient seen at bedside, non tachypneic on 5L/min. Trache collar, , reviewed treatment for pseudomonas + tracheitis, therapy changed per sensitivity panel to Cipro BID.  This may just be colonization but difficult to tell since  patient is non verbal and unable to relate symptomatology, he does not have leukocytosis, fever, tachycardia.     PAST MEDICAL HISTORY   Past Medical History:  Diagnosis Date   Hypertension      SURGICAL HISTORY   Past Surgical History:  Procedure Laterality Date   BACK SURGERY     CORONARY/GRAFT ACUTE MI REVASCULARIZATION N/A 03/23/2021   Procedure: Coronary/Graft Acute MI Revascularization;  Surgeon: Isaias Cowman, MD;  Location: [facility];  Service: Cardiovascular;  Laterality: N/A;   IR GASTROSTOMY TUBE MOD SED  04/26/2021   LEFT HEART CATH AND CORONARY ANGIOGRAPHY N/A 03/23/2021   Procedure: LEFT HEART CATH AND CORONARY ANGIOGRAPHY;  Surgeon: Isaias Cowman, MD;  Location: [facility];  Service: Cardiovascular;  Laterality: N/A;   SHOULDER SURGERY       FAMILY HISTORY   No family history on file.   SOCIAL HISTORY   Social History   Tobacco Use   Smoking status: Never   Smokeless tobacco: Never  Vaping Use   Vaping Use: Unknown  Substance Use Topics   Alcohol use: Yes   Drug use: Never     MEDICATIONS    Home Medication:    Current Medication:  Current Facility-Administered Medications:    acetaminophen (TYLENOL) tablet 650 mg, 650 mg, Oral, Q6H PRN **OR** acetaminophen (TYLENOL) suppository 650 mg, 650 mg, Rectal, Q6H PRN, Clarnce Flock, MD   atropine 1 % ophthalmic solution 2 drop, 2 drop, Sublingual, QID, Clarnce Flock, MD, 2 drop at 01/17/22 0843   carvedilol (COREG) tablet  3.125 mg, 3.125 mg, Per Tube, BID WC, Lanney Gins, Najmah Carradine, MD, 3.125 mg at 01/17/22 2025   Chlorhexidine Gluconate Cloth 2 % PADS 6 each, 6 each, Topical, Daily, Sharen Hones, MD, 6 each at 01/17/22 0910   ciprofloxacin (CIPRO) tablet 250 mg, 250 mg, Oral, BID, Ottie Glazier, MD   clonazePAM Bobbye Charleston) tablet 0.5 mg, 0.5 mg, Per Tube, BID, Clarnce Flock, MD, 0.5 mg at 01/17/22 1233   famotidine (PEPCID) tablet 20 mg, 20 mg, Per Tube,  Daily, Beers, Shanon Brow, RPH, 20 mg at 01/17/22 0837   feeding supplement (OSMOLITE 1.5 CAL) liquid 355 mL, 355 mL, Per Tube, QID, Sharen Hones, MD, 355 mL at 01/17/22 1233   feeding supplement (PROSource TF20) liquid 60 mL, 60 mL, Per Tube, Daily, Sharen Hones, MD, 60 mL at 01/17/22 1233   fiber (NUTRISOURCE FIBER) 1 packet, 1 packet, Per Tube, BID, Clarnce Flock, MD, 1 packet at 01/17/22 [id number]   free water 200 mL, 200 mL, Per Tube, Q6H, Sharen Hones, MD, 200 mL at 01/17/22 1234   glucagon (human recombinant) (GLUCAGEN) injection 1 mg, 1 mg, Intravenous, Once PRN, Amin, Ankit Chirag, MD   glycopyrrolate (ROBINUL) tablet 1 mg, 1 mg, Per Tube, TID, Clarnce Flock, MD, 1 mg at 01/17/22 0838   guaiFENesin (ROBITUSSIN) 100 MG/5ML liquid 5 mL, 5 mL, Oral, Q4H PRN, Amin, Ankit Chirag, MD   hydrALAZINE (APRESOLINE) injection 10 mg, 10 mg, Intravenous, Q4H PRN, Amin, Ankit Chirag, MD   hydrALAZINE (APRESOLINE) tablet 50 mg, 50 mg, Per Tube, TID, Clarnce Flock, MD, 50 mg at 01/17/22 0837   ipratropium-albuterol (DUONEB) 0.5-2.5 (3) MG/3ML nebulizer solution 3 mL, 3 mL, Nebulization, Q4H PRN, Clarnce Flock, MD   levETIRAcetam (KEPPRA) 100 MG/ML solution 750 mg, 750 mg, Per Tube, BID, Clarnce Flock, MD, 750 mg at 01/17/22 6237   loperamide HCl (IMODIUM) 1 MG/7.5ML suspension 2 mg, 2 mg, Per Tube, Daily PRN, Clarnce Flock, MD   metoCLOPramide (REGLAN) injection 10 mg, 10 mg, Intravenous, Q6H PRN, Kc, Ramesh, MD   metoprolol tartrate (LOPRESSOR) injection 5 mg, 5 mg, Intravenous, Q4H PRN, Amin, Ankit Chirag, MD   ondansetron (ZOFRAN) tablet 4 mg, 4 mg, Oral, Q6H PRN **OR** ondansetron (ZOFRAN) injection 4 mg, 4 mg, Intravenous, Q6H PRN, Clarnce Flock, MD, 4 mg at 01/07/22 1530   Oral care mouth rinse, 15 mL, Mouth Rinse, 4 times per day, Sharen Hones, MD, 15 mL at 01/17/22 1234   Oral care mouth rinse, 15 mL, Mouth Rinse, PRN, Sharen Hones, MD   oxyCODONE (Oxy IR/ROXICODONE)  immediate release tablet 5 mg, 5 mg, Oral, Q4H PRN, Clarnce Flock, MD   pantoprazole (PROTONIX) injection 40 mg, 40 mg, Intravenous, Daily, Beers, Shanon Brow, RPH, 40 mg at 01/17/22 6283   polyethylene glycol (MIRALAX / GLYCOLAX) packet 17 g, 17 g, Oral, Daily PRN, Clarnce Flock, MD   scopolamine (TRANSDERM-SCOP) 1 MG/3DAYS 1.5 mg, 1 patch, Transdermal, Q72H, Clarnce Flock, MD, 1.5 mg at 01/15/22 0846   sodium chloride flush (NS) 0.9 % injection 3 mL, 3 mL, Intravenous, Q12H, Clarnce Flock, MD, 3 mL at 01/17/22 0910   traZODone (DESYREL) tablet 50 mg, 50 mg, Oral, QHS PRN, Clarnce Flock, MD   valproic acid (DEPAKENE) 250 MG/5ML solution 750 mg, 750 mg, Per Tube, TID, Clarnce Flock, MD, 750 mg at 01/17/22 0841    ALLERGIES   Zestril [lisinopril]     REVIEW OF SYSTEMS    Review  of Systems:  Gen:  Denies  fever, sweats, chills weigh loss  HEENT: Denies blurred vision, double vision, ear pain, eye pain, hearing loss, nose bleeds, sore throat Cardiac:  No dizziness, chest pain or heaviness, chest tightness,edema Resp:   reports dyspnea chronically  Gi: Denies swallowing difficulty, stomach pain, nausea or vomiting, diarrhea, constipation, bowel incontinence Gu:  Denies bladder incontinence, burning urine Ext:   Denies Joint pain, stiffness or swelling Skin: Denies  skin rash, easy bruising or bleeding or hives Endoc:  Denies polyuria, polydipsia , polyphagia or weight change Psych:   Denies depression, insomnia or hallucinations   Other:  All other systems negative   VS: BP (!) 116/97 (BP Location: Right Arm)   Pulse 80   Temp 98.8 F (37.1 C)   Resp 17   Ht 6' (1.829 m)   Wt 95.8 kg   SpO2 99%   BMI 28.64 kg/m      PHYSICAL EXAM    GENERAL:NAD, no fevers, chills, no weakness no fatigue HEAD: Normocephalic, atraumatic.  EYES: Pupils equal, round, reactive to light. Extraocular muscles intact. No scleral icterus.  MOUTH: Moist mucosal  membrane. Dentition intact. No abscess noted.  EAR, NOSE, THROAT: Clear without exudates. No external lesions. , +trache NECK: Supple. No thyromegaly. No nodules. No JVD.  PULMONARY: decreased breath sounds with mild rhonchi worse at bases bilaterally.  CARDIOVASCULAR: S1 and S2. Regular rate and rhythm. No murmurs, rubs, or gallops. No edema. Pedal pulses 2+ bilaterally.  GASTROINTESTINAL: Soft, nontender, nondistended. No masses. Positive bowel sounds. No hepatosplenomegaly.  MUSCULOSKELETAL: No swelling, clubbing, or edema. Range of motion full in all extremities.  NEUROLOGIC: Cranial nerves II through XII are intact. No gross focal neurological deficits. Sensation intact. Reflexes intact.  SKIN: No ulceration, lesions, rashes, or cyanosis. Skin warm and dry. Turgor intact.  PSYCHIATRIC: Mood, affect within normal limits. The patient is awake, alert and oriented x 3. Insight, judgment intact.       IMAGING     ASSESSMENT/PLAN   Acute on chronic hypoxemic respiratory failure -  due to possible tracheitis - Aspirate + for pseudomonas - have narrowed regimen to cipro per sensitivity  -he has been on agumentin for aspiration and completed therapy - his blood cultures have + staph epidermitis likely contaminant -his TTE shows severe systolic CHF with EF 62-56% -patient on scopolamine, oxycodone and rubinol - s/p diuresis with 1L urine made overnight -s/p bronchoscopy with edematous and erythematous mucosa and bilateral mucus plugging.  BAL and therapeutic aspiration of tracheobroncheal tree was performed     Thank you for allowing me to participate in the care of this patient.   Patient/Family are satisfied with care plan and all questions have been answered.    Provider disclosure: Patient with at least one acute or chronic illness or injury that poses a threat to life or bodily function and is being managed actively during this encounter.  All of the below services have been  performed independently by signing provider:  review of prior documentation from internal and or external health records.  Review of previous and current lab results.  Interview and comprehensive assessment during patient visit today. Review of current and previous chest radiographs/CT scans. Discussion of management and test interpretation with health care team and patient/family.   This document was prepared using Dragon voice recognition software and may include unintentional dictation errors.     Ottie Glazier, M.D.  Division of Pulmonary & Critical Care Medicine

## 2022-01-18 ENCOUNTER — Inpatient Hospital Stay: Payer: BC Managed Care – PPO

## 2022-01-18 LAB — CBC
HCT: 36.1 % — ABNORMAL LOW (ref 39.0–52.0)
Hemoglobin: 11.6 g/dL — ABNORMAL LOW (ref 13.0–17.0)
MCH: 31.8 pg (ref 26.0–34.0)
MCHC: 32.1 g/dL (ref 30.0–36.0)
MCV: 98.9 fL (ref 80.0–100.0)
Platelets: 268 10*3/uL (ref 150–400)
RBC: 3.65 MIL/uL — ABNORMAL LOW (ref 4.22–5.81)
RDW: 13.4 % (ref 11.5–15.5)
WBC: 7.8 10*3/uL (ref 4.0–10.5)
nRBC: 0 % (ref 0.0–0.2)

## 2022-01-18 LAB — GLUCOSE, CAPILLARY
Glucose-Capillary: 132 mg/dL — ABNORMAL HIGH (ref 70–99)
Glucose-Capillary: 109 mg/dL — ABNORMAL HIGH (ref 70–99)
Glucose-Capillary: 90 mg/dL (ref 70–99)
Glucose-Capillary: 119 mg/dL — ABNORMAL HIGH (ref 70–99)
Glucose-Capillary: 125 mg/dL — ABNORMAL HIGH (ref 70–99)
Glucose-Capillary: 148 mg/dL — ABNORMAL HIGH (ref 70–99)

## 2022-01-18 LAB — BASIC METABOLIC PANEL
Anion gap: 9 (ref 5–15)
BUN: 28 mg/dL — ABNORMAL HIGH (ref 6–20)
CO2: 31 mmol/L (ref 22–32)
Calcium: 9.5 mg/dL (ref 8.9–10.3)
Chloride: 98 mmol/L (ref 98–111)
Creatinine, Ser: 0.83 mg/dL (ref 0.61–1.24)
GFR, Estimated: >60 mL/min (ref >60)
Glucose, Bld: 98 mg/dL (ref 70–99)
Potassium: 4.5 mmol/L (ref 3.5–5.1)
Sodium: 138 mmol/L (ref 135–145)

## 2022-01-18 LAB — ACID FAST SMEAR (AFB, MYCOBACTERIA): Acid Fast Smear: NEGATIVE

## 2022-01-18 LAB — MAGNESIUM: Magnesium: 2 mg/dL (ref 1.7–2.4)

## 2022-01-18 NOTE — Plan of Care (Signed)

## 2022-01-18 NOTE — Progress Notes (Signed)
PULMONOLOGY         Date: 01/18/2022,   MRN# [id number] Billy Cherry 1970/05/09     AdmissionWeight: 108.9 kg                 CurrentWeight: 94.7 kg  Referring provider: Rosine:   Acute on chronic hypoxemic respiratory failure    HISTORY OF PRESENT ILLNESS   Billy Cherry is a 51 y.o. male with medical history significant for anoxic brain injury and persistent vegetative state status post tracheostomy and PEG tube placement, chronic systolic CHF, hypertension, DVT on chronic anticoagulation, who presents with fever and tachycardia.Patient was brought in from home due to fever, tachycardia, and thick secretions from his tracheostomy tube.  No additional history given. Patient was febrile on arrival with tachypnea and tacchycardia and hypoxemia.  CBC notable for white count of 17, mild anemia with hemoglobin 12.2, CMP was unremarkable.  Lactic acid was elevated at 2.5, chest x-ray showed low lung volumes and no obvious infiltrates.  Blood and urine cultures were obtained.  Respiratory viral panel was obtained.  He was given a dose of azithromycin, cefepime, vancomycin, 1 L NS bolus, and admitted for further management. He had CT chest abd and pelvis in June 2023 with findings of pulmonary interstitial edema bilaterally at moderate severity, he had CXR on arrival with findings of poor inspiratory effort, mild pulmonary edema.   01/13/22- patient with persistent high volume mucopuruent secretions despite antibiotics.  Will perform airway inspection and bronchial washings for microbiology today.  01/14/22 - patient doing well on 5L/min seems to be coughing less with less secretions.  Reviewed medical plan with Dr Reesa Chew today.  01/17/22- patient seen at bedside, non tachypneic on 5L/min. Trache collar, , reviewed treatment for pseudomonas + tracheitis, therapy changed per sensitivity panel to Cipro BID.  This may just be colonization but difficult to tell since  patient is non verbal and unable to relate symptomatology, he does not have leukocytosis, fever, tachycardia.   01/18/22 -patient seen at bedside, trache secrections are unchanged from yesterday. He is on cipro BID.  He is stable non labored breathing , no accessory muscle use, non tachypneic , at 5L/min.    PAST MEDICAL HISTORY   Past Medical History:  Diagnosis Date   Hypertension      SURGICAL HISTORY   Past Surgical History:  Procedure Laterality Date   BACK SURGERY     CORONARY/GRAFT ACUTE MI REVASCULARIZATION N/A 03/23/2021   Procedure: Coronary/Graft Acute MI Revascularization;  Surgeon: Isaias Cowman, MD;  Location: [facility];  Service: Cardiovascular;  Laterality: N/A;   IR GASTROSTOMY TUBE MOD SED  04/26/2021   LEFT HEART CATH AND CORONARY ANGIOGRAPHY N/A 03/23/2021   Procedure: LEFT HEART CATH AND CORONARY ANGIOGRAPHY;  Surgeon: Isaias Cowman, MD;  Location: [facility];  Service: Cardiovascular;  Laterality: N/A;   SHOULDER SURGERY       FAMILY HISTORY   No family history on file.   SOCIAL HISTORY   Social History   Tobacco Use   Smoking status: Never   Smokeless tobacco: Never  Vaping Use   Vaping Use: Unknown  Substance Use Topics   Alcohol use: Yes   Drug use: Never     MEDICATIONS    Home Medication:    Current Medication:  Current Facility-Administered Medications:    acetaminophen (TYLENOL) tablet 650 mg, 650 mg, Oral, Q6H PRN **OR** acetaminophen (TYLENOL) suppository 650 mg, 650 mg,  Rectal, Q6H PRN, Clarnce Flock, MD   apixaban Arne Cleveland) tablet 5 mg, 5 mg, Oral, BID, Priscella Mann, Sudheer B, MD, 5 mg at 01/18/22 0902   atropine 1 % ophthalmic solution 2 drop, 2 drop, Sublingual, QID, Clarnce Flock, MD, 2 drop at 01/18/22 0904   carvedilol (COREG) tablet 3.125 mg, 3.125 mg, Per Tube, BID WC, Ottie Glazier, MD, 3.125 mg at 01/18/22 0901   Chlorhexidine Gluconate Cloth 2 % PADS 6 each, 6 each, Topical,  Daily, Sharen Hones, MD, 6 each at 01/18/22 1042   ciprofloxacin (CIPRO) tablet 250 mg, 250 mg, Oral, BID, Ottie Glazier, MD, 250 mg at 01/18/22 1275   clonazePAM (KLONOPIN) tablet 0.5 mg, 0.5 mg, Per Tube, BID, Clarnce Flock, MD, 0.5 mg at 01/18/22 0902   famotidine (PEPCID) tablet 20 mg, 20 mg, Per Tube, Daily, Beers, Shanon Brow, RPH, 20 mg at 01/18/22 0901   feeding supplement (OSMOLITE 1.5 CAL) liquid 355 mL, 355 mL, Per Tube, QID, Sharen Hones, MD, 355 mL at 01/18/22 1206   feeding supplement (PROSource TF20) liquid 60 mL, 60 mL, Per Tube, Daily, Sharen Hones, MD, 60 mL at 01/18/22 1208   fiber (NUTRISOURCE FIBER) 1 packet, 1 packet, Per Tube, BID, Clarnce Flock, MD, 1 packet at 01/18/22 0900   free water 200 mL, 200 mL, Per Tube, Q6H, Sharen Hones, MD, 200 mL at 01/18/22 1209   furosemide (LASIX) injection 40 mg, 40 mg, Intravenous, Daily, Priscella Mann, Sudheer B, MD, 40 mg at 01/18/22 0902   glucagon (human recombinant) (GLUCAGEN) injection 1 mg, 1 mg, Intravenous, Once PRN, Amin, Ankit Chirag, MD   glycopyrrolate (ROBINUL) tablet 1 mg, 1 mg, Per Tube, TID, Clarnce Flock, MD, 1 mg at 01/18/22 0858   guaiFENesin (ROBITUSSIN) 100 MG/5ML liquid 5 mL, 5 mL, Oral, Q4H PRN, Amin, Ankit Chirag, MD   hydrALAZINE (APRESOLINE) injection 10 mg, 10 mg, Intravenous, Q4H PRN, Amin, Ankit Chirag, MD   hydrALAZINE (APRESOLINE) tablet 50 mg, 50 mg, Per Tube, TID, Clarnce Flock, MD, 50 mg at 01/18/22 0902   ipratropium-albuterol (DUONEB) 0.5-2.5 (3) MG/3ML nebulizer solution 3 mL, 3 mL, Nebulization, Q4H PRN, Clarnce Flock, MD   levETIRAcetam (KEPPRA) 100 MG/ML solution 750 mg, 750 mg, Per Tube, BID, Clarnce Flock, MD, 750 mg at 01/18/22 0900   loperamide HCl (IMODIUM) 1 MG/7.5ML suspension 2 mg, 2 mg, Per Tube, Daily PRN, Clarnce Flock, MD   metoCLOPramide (REGLAN) injection 10 mg, 10 mg, Intravenous, Q6H PRN, Kc, Ramesh, MD   metoprolol tartrate (LOPRESSOR) injection 5 mg, 5  mg, Intravenous, Q4H PRN, Amin, Ankit Chirag, MD   ondansetron (ZOFRAN) tablet 4 mg, 4 mg, Oral, Q6H PRN **OR** ondansetron (ZOFRAN) injection 4 mg, 4 mg, Intravenous, Q6H PRN, Clarnce Flock, MD, 4 mg at 01/07/22 1530   Oral care mouth rinse, 15 mL, Mouth Rinse, 4 times per day, Sharen Hones, MD, 15 mL at 01/18/22 1209   Oral care mouth rinse, 15 mL, Mouth Rinse, PRN, Sharen Hones, MD   oxyCODONE (Oxy IR/ROXICODONE) immediate release tablet 5 mg, 5 mg, Oral, Q4H PRN, Clarnce Flock, MD   pantoprazole (PROTONIX) injection 40 mg, 40 mg, Intravenous, Daily, Beers, Shanon Brow, RPH, 40 mg at 01/18/22 0902   polyethylene glycol (MIRALAX / GLYCOLAX) packet 17 g, 17 g, Oral, Daily PRN, Clarnce Flock, MD   scopolamine (TRANSDERM-SCOP) 1 MG/3DAYS 1.5 mg, 1 patch, Transdermal, Q72H, Clarnce Flock, MD, 1.5 mg at 01/18/22 0859   sodium chloride  flush (NS) 0.9 % injection 3 mL, 3 mL, Intravenous, Q12H, Clarnce Flock, MD, 3 mL at 01/18/22 0903   traZODone (DESYREL) tablet 50 mg, 50 mg, Oral, QHS PRN, Clarnce Flock, MD   valproic acid (DEPAKENE) 250 MG/5ML solution 750 mg, 750 mg, Per Tube, TID, Clarnce Flock, MD, 750 mg at 01/18/22 1610    ALLERGIES   Zestril [lisinopril]     REVIEW OF SYSTEMS    Review of Systems:  Gen:  Denies  fever, sweats, chills weigh loss  HEENT: Denies blurred vision, double vision, ear pain, eye pain, hearing loss, nose bleeds, sore throat Cardiac:  No dizziness, chest pain or heaviness, chest tightness,edema Resp:   reports dyspnea chronically  Gi: Denies swallowing difficulty, stomach pain, nausea or vomiting, diarrhea, constipation, bowel incontinence Gu:  Denies bladder incontinence, burning urine Ext:   Denies Joint pain, stiffness or swelling Skin: Denies  skin rash, easy bruising or bleeding or hives Endoc:  Denies polyuria, polydipsia , polyphagia or weight change Psych:   Denies depression, insomnia or hallucinations   Other:   All other systems negative   VS: BP 112/74 (BP Location: Right Arm)   Pulse 88   Temp 99 F (37.2 C)   Resp 19   Ht 6' (1.829 m)   Wt 94.7 kg   SpO2 100%   BMI 28.32 kg/m      PHYSICAL EXAM    GENERAL:NAD, no fevers, chills, no weakness no fatigue HEAD: Normocephalic, atraumatic.  EYES: Pupils equal, round, reactive to light. Extraocular muscles intact. No scleral icterus.  MOUTH: Moist mucosal membrane. Dentition intact. No abscess noted.  EAR, NOSE, THROAT: Clear without exudates. No external lesions. , +trache NECK: Supple. No thyromegaly. No nodules. No JVD.  PULMONARY: decreased breath sounds with mild rhonchi worse at bases bilaterally.  CARDIOVASCULAR: S1 and S2. Regular rate and rhythm. No murmurs, rubs, or gallops. No edema. Pedal pulses 2+ bilaterally.  GASTROINTESTINAL: Soft, nontender, nondistended. No masses. Positive bowel sounds. No hepatosplenomegaly.  MUSCULOSKELETAL: No swelling, clubbing, or edema. Range of motion full in all extremities.  NEUROLOGIC: Cranial nerves II through XII are intact. No gross focal neurological deficits. Sensation intact. Reflexes intact.  SKIN: No ulceration, lesions, rashes, or cyanosis. Skin warm and dry. Turgor intact.  PSYCHIATRIC: Mood, affect within normal limits. The patient is awake, alert and oriented x 3. Insight, judgment intact.       IMAGING     ASSESSMENT/PLAN   Acute on chronic hypoxemic respiratory failure -  due to possible tracheitis - Aspirate + for pseudomonas - have narrowed regimen to cipro per sensitivity  -he has been on agumentin for aspiration and completed therapy - his blood cultures have + staph epidermitis likely contaminant -his TTE shows severe systolic CHF with EF 96-04% -patient on scopolamine, oxycodone and rubinol - s/p diuresis with negative 3L fluid balance now -s/p bronchoscopy with edematous and erythematous mucosa and bilateral mucus plugging.  BAL and therapeutic aspiration of  tracheobroncheal tree was performed     Thank you for allowing me to participate in the care of this patient.   Patient/Family are satisfied with care plan and all questions have been answered.    Provider disclosure: Patient with at least one acute or chronic illness or injury that poses a threat to life or bodily function and is being managed actively during this encounter.  All of the below services have been performed independently by signing provider:  review of prior documentation from internal  and or external health records.  Review of previous and current lab results.  Interview and comprehensive assessment during patient visit today. Review of current and previous chest radiographs/CT scans. Discussion of management and test interpretation with health care team and patient/family.   This document was prepared using Dragon voice recognition software and may include unintentional dictation errors.     Ottie Glazier, M.D.  Division of Pulmonary & Critical Care Medicine

## 2022-01-18 NOTE — Progress Notes (Signed)
PROGRESS NOTE    Billy Cherry  LFY:[id number] DOB: May 01, 1970 DOA: 01/05/2022 PCP: [facility]    Brief Narrative:  51 y.o. male with medical history significant for anoxic brain injury and persistent vegetative state status post tracheostomy and PEG tube placement, chronic systolic CHF, hypertension, DVT on chronic anticoagulation, who presents with fever and tachycardia.  Patient had a thick secretion from trach tube, chest x-ray showed bilateral lower lobe atelectasis.  Patient also had a lactic acid level of 2.7. Admitted for sepsis likely from aspiration pneumonia placed on antibiotics cefepime, Flagyl.  Patient developed significant nausea vomiting on 11/4, KUB did while not see any acute changes.  Started on Reglan.  Patient wife also had GI symptoms, it appears that patient had gastroenteritis, causing nausea, subsequently caused aspiration pneumonia. Patient antibiotic was switched to Augmentin on 11/7.  Blood culture with Staph capitis in single single set likely contamination. Patient continued with full supportive care trach care respiratory care. Patient given few days of IV Lasix swelling has improved weight has returned to his baseline. Overall remains stable given his overall status and always at risk of aspiration/infection/pneumonia bedsores. Advise continued follow-up with his pulmonary team at [facility]. Pulm team consulted for increase in tracheal secretions. Patient had bronchoscopy on 11/11 showing b/l mucopurulent secretions with erythema.    Assessment & Plan:   Principal Problem:   Severe sepsis ([place]) Active Problems:   Anoxic brain injury ([place])   DVT (deep venous thrombosis) ([name])   HTN (hypertension)   Chronic systolic CHF (congestive heart failure) ([name])   Tracheostomy dependent ([name])   S/P percutaneous endoscopic gastrostomy (PEG) tube placement ([name])   Aspiration pneumonia of both lower lobes ([name])   Gastroenteritis  Severe sepsis due to aspiration  pneumonia Aspiration pneumonia Trachitis with increase tracheal secretions.  Bcx had grown Stap Capitis treated with Augmentin. Still concerns of infection, therefore tracheal secretions cultures ordered. Patient had bronchoscopy on 11/11 showing b/l mucopurulent secretions.  Tracheal aspirate growing GPC, GNR/Psuedomonas.  BAL cultures sent. Was on Zosyn.  BAL cultures show no result.  De-escalated to cipro.  If stable, anticipate dc tomorrow AM.   Pulmonary congestion Acute systolic CHF, ef 85% Initially was on diuretics to help with decongestion but not due to the signs of dehydration they are on hold.  Continue Coreg.   AKI; resolved.  Elevated Cr, 0.8 > 2.1 > 0.92. Will hold diuretics.    Mild Hyperkalemia Resolved   Acute gastroenteritis: Improved.  w/ loose BM.  Tolerating tube feeding no nausea vomiting.Continue bolus tube feeding.  At home on continues tube feeding, continue supportive care.  Dietitian following.   Borderline blood sugar level: Blood sugar at this time is stable, continue tube feeding.    Anoxic brain injury  and dependent on Trach/Peg, able to track but nonverbal. Continue trach collar and trach suctioning, tube feeding.  Continue home atropine eye drops, Pepcid, Keppra Depakene.   DVT/PE hx: cont  home Eliquis   Hypertension BP fairly stable cont  hydralazine, Coreg  DVT prophylaxis: SCDs, eliquyis Code Status: FULL Family Communication:None today.  Called wife, left VM Disposition Plan: Status is: Inpatient Remains inpatient appropriate because: Aspiration pnuemonia.  Doing clinically well. Anticipate dc 11/17   Level of care: Telemetry Cardiac  Consultants:  Pulmonary  Procedures:  Bronchoscopy  Antimicrobials: Cipro   Subjective: Seen and examined.  Visibly NAD  Objective: Vitals:   01/18/22 0756 01/18/22 0820 01/18/22 1137 01/18/22 1352  BP:  (!) 136/90 112/74 (!) 130/95  Pulse: 83 76 88 88  Resp: _0 Temp:  98.4 F (36.9  C) 99 F (37.2 C) (!) 97.2 F (36.2 C)  TempSrc:  Axillary  Axillary  SpO2:  100% 100% 100%  Weight:      Height:        Intake/Output Summary (Last 24 hours) at 01/18/2022 1609 Last data filed at 01/18/2022 1159 Gross per 24 hour  Intake 625 ml  Output 1950 ml  Net -1325 ml   Filed Weights   01/16/22 0500 01/17/22 0519 01/18/22 0500  Weight: 93 kg 95.8 kg 94.7 kg    Examination:  General exam: Appears chronically ill, no visible distress Respiratory system: Coarse breath sounds, trach mask 5L Cardiovascular system: S1S2, RRR, no murmur Gastrointestinal system: Soft ND, +Peg Central nervous system: Oriented x 0 Extremities: Symmetric 0 x 5 power. Skin: No rashes, lesions or ulcers Psychiatry: Unable to assess    Data Reviewed: I have personally reviewed following labs and imaging studies  CBC: Recent Labs  Lab 01/14/22 0730 01/15/22 0513 01/16/22 0658 01/17/22 0552 01/18/22 0536  WBC 10.0 21.1* 6.3 5.9 7.8  HGB 14.1 15.1 12.1* 13.7 11.6*  HCT 43.0 45.2 37.7* 42.0 36.1*  MCV 97.5 89.5 97.2 97.7 98.9  PLT 188 281 258 141* 213   Basic Metabolic Panel: Recent Labs  Lab 01/14/22 0730 01/15/22 0513 01/16/22 0658 01/17/22 0552 01/18/22 0536  NA 137 136 138 140 138  K 5.3* 5.2* 4.8 5.7* 4.5  CL 99 104 99 102 98  CO2 31 25 32 27 31  GLUCOSE 84 139* 95 98 98  BUN 25* 59* 32* 31* 28*  CREATININE 0.86 2.11* 0.92 0.84 0.83  CALCIUM 9.9 9.3 9.4 9.8 9.5  MG 2.1 2.6* 2.1 2.2 2.0   GFR: Estimated Creatinine Clearance: 125.7 mL/min (by C-G formula based on SCr of 0.83 mg/dL). Liver Function Tests: No results for input(s): "AST", "ALT", "ALKPHOS", "BILITOT", "PROT", "ALBUMIN" in the last 168 hours. No results for input(s): "LIPASE", "AMYLASE" in the last 168 hours. No results for input(s): "AMMONIA" in the last 168 hours. Coagulation Profile: No results for input(s): "INR", "PROTIME" in the last 168 hours. Cardiac Enzymes: No results for input(s): "CKTOTAL",  "CKMB", "CKMBINDEX", "TROPONINI" in the last 168 hours. BNP (last 3 results) No results for input(s): "PROBNP" in the last 8760 hours. HbA1C: No results for input(s): "HGBA1C" in the last 72 hours. CBG: Recent Labs  Lab 01/17/22 2053 01/18/22 0137 01/18/22 0357 01/18/22 0739 01/18/22 1142  GLUCAP 87 132* 109* 90 119*   Lipid Profile: No results for input(s): "CHOL", "HDL", "LDLCALC", "TRIG", "CHOLHDL", "LDLDIRECT" in the last 72 hours. Thyroid Function Tests: No results for input(s): "TSH", "T4TOTAL", "FREET4", "T3FREE", "THYROIDAB" in the last 72 hours. Anemia Panel: No results for input(s): "VITAMINB12", "FOLATE", "FERRITIN", "TIBC", "IRON", "RETICCTPCT" in the last 72 hours. Sepsis Labs: No results for input(s): "PROCALCITON", "LATICACIDVEN" in the last 168 hours.  Recent Results (from the past 240 hour(s))  Culture, Respiratory w Gram Stain     Status: None   Collection Time: 01/12/22  4:15 PM   Specimen: Tracheal Aspirate; Respiratory  Result Value Ref Range Status   Specimen Description   Final    TRACHEAL ASPIRATE Performed at [facility], [address], [place], [place] [address]    Special Requests   Final    NONE Performed at [facility], [address] [place], [place] [address]    Gram Stain   Final  RARE WBC PRESENT, PREDOMINANTLY MONONUCLEAR FEW GRAM POSITIVE COCCI IN PAIRS FEW GRAM NEGATIVE RODS Performed at [facility], [place] [address] [place], [place] [address]    Culture MODERATE PSEUDOMONAS AERUGINOSA  Final   Report Status 01/16/2022 FINAL  Final   Organism ID, Bacteria PSEUDOMONAS AERUGINOSA  Final      Susceptibility   Pseudomonas aeruginosa - MIC*    CEFTAZIDIME <=1 SENSITIVE Sensitive     CIPROFLOXACIN <=0.25 SENSITIVE Sensitive     GENTAMICIN <=1 SENSITIVE Sensitive     IMIPENEM 2 SENSITIVE Sensitive     PIP/TAZO 16 SENSITIVE Sensitive     CEFEPIME 2 SENSITIVE Sensitive     * MODERATE PSEUDOMONAS  AERUGINOSA  Acid Fast Smear (AFB)     Status: None   Collection Time: 01/13/22  3:29 PM   Specimen: Bronchial Washing, Left; Respiratory  Result Value Ref Range Status   AFB Specimen Processing Concentration  Final   Acid Fast Smear Negative  Final    Comment: (NOTE) Performed At: [facility] [address] [place], [place] [id number] Rush Farmer MD LK:[id number]    Source (AFB) SPU  Final    Comment: Performed at [facility], [address], [place], [place] [address]         Radiology Studies: No results found.      Scheduled Meds:  apixaban  5 mg Oral BID   atropine  2 drop Sublingual QID   carvedilol  3.125 mg Per Tube BID WC   Chlorhexidine Gluconate Cloth  6 each Topical Daily   ciprofloxacin  250 mg Oral BID   clonazePAM  0.5 mg Per Tube BID   famotidine  20 mg Per Tube Daily   feeding supplement (OSMOLITE 1.5 CAL)  355 mL Per Tube QID   feeding supplement (PROSource TF20)  60 mL Per Tube Daily   fiber  1 packet Per Tube BID   free water  200 mL Per Tube Q6H   furosemide  40 mg Intravenous Daily   glycopyrrolate  1 mg Per Tube TID   hydrALAZINE  50 mg Per Tube TID   levETIRAcetam  750 mg Per Tube BID   mouth rinse  15 mL Mouth Rinse 4 times per day   pantoprazole (PROTONIX) IV  40 mg Intravenous Daily   scopolamine  1 patch Transdermal Q72H   sodium chloride flush  3 mL Intravenous Q12H   valproic acid  750 mg Per Tube TID   Continuous Infusions:   LOS: 12 days      Sidney Ace, MD [facility]   If 7PM-7AM, please contact night-coverage  01/18/2022, 4:09 PM

## 2022-01-19 LAB — MAGNESIUM: Magnesium: 2.1 mg/dL (ref 1.7–2.4)

## 2022-01-19 LAB — GLUCOSE, CAPILLARY
Glucose-Capillary: 105 mg/dL — ABNORMAL HIGH (ref 70–99)
Glucose-Capillary: 106 mg/dL — ABNORMAL HIGH (ref 70–99)
Glucose-Capillary: 106 mg/dL — ABNORMAL HIGH (ref 70–99)
Glucose-Capillary: 130 mg/dL — ABNORMAL HIGH (ref 70–99)
Glucose-Capillary: 140 mg/dL — ABNORMAL HIGH (ref 70–99)
Glucose-Capillary: 147 mg/dL — ABNORMAL HIGH (ref 70–99)
Glucose-Capillary: 77 mg/dL (ref 70–99)

## 2022-01-19 LAB — CBC
HCT: 37.2 % — ABNORMAL LOW (ref 39.0–52.0)
Hemoglobin: 12.1 g/dL — ABNORMAL LOW (ref 13.0–17.0)
MCH: 31.6 pg (ref 26.0–34.0)
MCHC: 32.5 g/dL (ref 30.0–36.0)
MCV: 97.1 fL (ref 80.0–100.0)
Platelets: 257 10*3/uL (ref 150–400)
RBC: 3.83 MIL/uL — ABNORMAL LOW (ref 4.22–5.81)
RDW: 13.4 % (ref 11.5–15.5)
WBC: 7.6 10*3/uL (ref 4.0–10.5)
nRBC: 0 % (ref 0.0–0.2)

## 2022-01-19 LAB — BASIC METABOLIC PANEL
Anion gap: 8 (ref 5–15)
BUN: 33 mg/dL — ABNORMAL HIGH (ref 6–20)
CO2: 34 mmol/L — ABNORMAL HIGH (ref 22–32)
Calcium: 9.6 mg/dL (ref 8.9–10.3)
Chloride: 96 mmol/L — ABNORMAL LOW (ref 98–111)
Creatinine, Ser: 0.93 mg/dL (ref 0.61–1.24)
GFR, Estimated: >60 mL/min (ref >60)
Glucose, Bld: 88 mg/dL (ref 70–99)
Potassium: 4.5 mmol/L (ref 3.5–5.1)
Sodium: 138 mmol/L (ref 135–145)

## 2022-01-19 MED ORDER — CARVEDILOL 6.25 MG PO TABS: 6.2500 mg | ORAL_TABLET | Freq: Two times a day (BID) | ORAL | 1 refills | Status: DC

## 2022-01-19 MED ORDER — CIPROFLOXACIN HCL 250 MG PO TABS: 250.0000 mg | ORAL_TABLET | Freq: Two times a day (BID) | ORAL | 0 refills | Status: AC

## 2022-01-19 NOTE — Progress Notes (Signed)
Hydralazine held d/t low bp at noon and this evening.

## 2022-01-19 NOTE — Plan of Care (Signed)

## 2022-01-19 NOTE — Progress Notes (Signed)
       CROSS COVER NOTE  NAME: Billy Cherry MRN: [id number] DOB : 03-Oct-1970    Time of Service   2130  HPI/Events of Note   Notified by RN patient with large amount vomitus before night time bolus feed  Assessment and  Interventions   Assessment:  Plan: Hold tube feed Give reglan previously ordered. Monitor for aspiration     Donnie Mesa NP [facility]

## 2022-01-19 NOTE — Progress Notes (Signed)
PULMONOLOGY         Date: 01/19/2022,   MRN# [id number] Billy Cherry 1970-10-09     AdmissionWeight: 108.9 kg                 CurrentWeight: 94.7 kg  Referring provider: [place]:   Acute on chronic hypoxemic respiratory failure    HISTORY OF PRESENT ILLNESS   Billy Cherry is a 51 y.o. male with medical history significant for anoxic brain injury and persistent vegetative state status post tracheostomy and PEG tube placement, chronic systolic CHF, hypertension, DVT on chronic anticoagulation, who presents with fever and tachycardia.Patient was brought in from home due to fever, tachycardia, and thick secretions from his tracheostomy tube.  No additional history given. Patient was febrile on arrival with tachypnea and tacchycardia and hypoxemia.  CBC notable for white count of 17, mild anemia with hemoglobin 12.2, CMP was unremarkable.  Lactic acid was elevated at 2.5, chest x-ray showed low lung volumes and no obvious infiltrates.  Blood and urine cultures were obtained.  Respiratory viral panel was obtained.  He was given a dose of azithromycin, cefepime, vancomycin, 1 L NS bolus, and admitted for further management. He had CT chest abd and pelvis in June 2023 with findings of pulmonary interstitial edema bilaterally at moderate severity, he had CXR on arrival with findings of poor inspiratory effort, mild pulmonary edema.   01/13/22- patient with persistent high volume mucopuruent secretions despite antibiotics.  Will perform airway inspection and bronchial washings for microbiology today.  01/14/22 - patient doing well on 5L/min seems to be coughing less with less secretions.  Reviewed medical plan with Dr Reesa Chew today.  01/17/22- patient seen at bedside, non tachypneic on 5L/min. Trache collar, , reviewed treatment for pseudomonas + tracheitis, therapy changed per sensitivity panel to Cipro BID.  This may just be colonization but difficult to tell since  patient is non verbal and unable to relate symptomatology, he does not have leukocytosis, fever, tachycardia.   01/18/22 -patient seen at bedside, trache secrections are unchanged from yesterday. He is on cipro BID.  He is stable non labored breathing , no accessory muscle use, non tachypneic , at 5L/min.  01/19/22-  patient has been diuresed and is less ronchorous, negative 4L on fluid balance.  He is on cipro and may continue this for 5 more days to help with pseudomonas tracheitis although still were not completely sure if this is a chronic colonizer which may be incidentally found.    PAST MEDICAL HISTORY   Past Medical History:  Diagnosis Date   Hypertension      SURGICAL HISTORY   Past Surgical History:  Procedure Laterality Date   BACK SURGERY     CORONARY/GRAFT ACUTE MI REVASCULARIZATION N/A 03/23/2021   Procedure: Coronary/Graft Acute MI Revascularization;  Surgeon: Isaias Cowman, MD;  Location: [facility];  Service: Cardiovascular;  Laterality: N/A;   IR GASTROSTOMY TUBE MOD SED  04/26/2021   LEFT HEART CATH AND CORONARY ANGIOGRAPHY N/A 03/23/2021   Procedure: LEFT HEART CATH AND CORONARY ANGIOGRAPHY;  Surgeon: Isaias Cowman, MD;  Location: [facility];  Service: Cardiovascular;  Laterality: N/A;   SHOULDER SURGERY       FAMILY HISTORY   No family history on file.   SOCIAL HISTORY   Social History   Tobacco Use   Smoking status: Never   Smokeless tobacco: Never  Vaping Use   Vaping Use: Unknown  Substance Use  Topics   Alcohol use: Yes   Drug use: Never     MEDICATIONS    Home Medication:    Current Medication:  Current Facility-Administered Medications:    acetaminophen (TYLENOL) tablet 650 mg, 650 mg, Oral, Q6H PRN **OR** acetaminophen (TYLENOL) suppository 650 mg, 650 mg, Rectal, Q6H PRN, Clarnce Flock, MD   apixaban Arne Cleveland) tablet 5 mg, 5 mg, Oral, BID, Priscella Mann, Sudheer B, MD, 5 mg at 01/19/22 0848   atropine  1 % ophthalmic solution 2 drop, 2 drop, Sublingual, QID, Clarnce Flock, MD, 2 drop at 01/19/22 1000   carvedilol (COREG) tablet 3.125 mg, 3.125 mg, Per Tube, BID WC, Ottie Glazier, MD, 3.125 mg at 01/19/22 0847   Chlorhexidine Gluconate Cloth 2 % PADS 6 each, 6 each, Topical, Daily, Sharen Hones, MD, 6 each at 01/18/22 1042   ciprofloxacin (CIPRO) tablet 250 mg, 250 mg, Oral, BID, Ottie Glazier, MD, 250 mg at 01/19/22 8676   clonazePAM (KLONOPIN) tablet 0.5 mg, 0.5 mg, Per Tube, BID, Clarnce Flock, MD, 0.5 mg at 01/19/22 0848   famotidine (PEPCID) tablet 20 mg, 20 mg, Per Tube, Daily, Beers, Shanon Brow, RPH, 20 mg at 01/19/22 0848   feeding supplement (OSMOLITE 1.5 CAL) liquid 355 mL, 355 mL, Per Tube, QID, Sharen Hones, MD, 355 mL at 01/19/22 1206   feeding supplement (PROSource TF20) liquid 60 mL, 60 mL, Per Tube, Daily, Sharen Hones, MD, 60 mL at 01/19/22 1206   fiber (NUTRISOURCE FIBER) 1 packet, 1 packet, Per Tube, BID, Clarnce Flock, MD, 1 packet at 01/19/22 0855   free water 200 mL, 200 mL, Per Tube, Q6H, Sharen Hones, MD, 200 mL at 01/19/22 1206   glucagon (human recombinant) (GLUCAGEN) injection 1 mg, 1 mg, Intravenous, Once PRN, Amin, Ankit Chirag, MD   glycopyrrolate (ROBINUL) tablet 1 mg, 1 mg, Per Tube, TID, Clarnce Flock, MD, 1 mg at 01/19/22 0855   guaiFENesin (ROBITUSSIN) 100 MG/5ML liquid 5 mL, 5 mL, Oral, Q4H PRN, Amin, Ankit Chirag, MD   hydrALAZINE (APRESOLINE) injection 10 mg, 10 mg, Intravenous, Q4H PRN, Amin, Ankit Chirag, MD   hydrALAZINE (APRESOLINE) tablet 50 mg, 50 mg, Per Tube, TID, Clarnce Flock, MD, 50 mg at 01/19/22 0848   ipratropium-albuterol (DUONEB) 0.5-2.5 (3) MG/3ML nebulizer solution 3 mL, 3 mL, Nebulization, Q4H PRN, Clarnce Flock, MD   levETIRAcetam (KEPPRA) 100 MG/ML solution 750 mg, 750 mg, Per Tube, BID, Clarnce Flock, MD, 750 mg at 01/19/22 1950   loperamide HCl (IMODIUM) 1 MG/7.5ML suspension 2 mg, 2 mg, Per Tube,  Daily PRN, Clarnce Flock, MD   metoCLOPramide (REGLAN) injection 10 mg, 10 mg, Intravenous, Q6H PRN, Kc, Ramesh, MD   metoprolol tartrate (LOPRESSOR) injection 5 mg, 5 mg, Intravenous, Q4H PRN, Amin, Ankit Chirag, MD   ondansetron (ZOFRAN) tablet 4 mg, 4 mg, Oral, Q6H PRN **OR** ondansetron (ZOFRAN) injection 4 mg, 4 mg, Intravenous, Q6H PRN, Clarnce Flock, MD, 4 mg at 01/07/22 1530   Oral care mouth rinse, 15 mL, Mouth Rinse, 4 times per day, Sharen Hones, MD, 15 mL at 01/19/22 1206   Oral care mouth rinse, 15 mL, Mouth Rinse, PRN, Sharen Hones, MD   oxyCODONE (Oxy IR/ROXICODONE) immediate release tablet 5 mg, 5 mg, Oral, Q4H PRN, Clarnce Flock, MD   pantoprazole (PROTONIX) injection 40 mg, 40 mg, Intravenous, Daily, Beers, Shanon Brow, RPH, 40 mg at 01/19/22 0848   polyethylene glycol (MIRALAX / GLYCOLAX) packet 17 g, 17 g, Oral,  Daily PRN, Clarnce Flock, MD   scopolamine (TRANSDERM-SCOP) 1 MG/3DAYS 1.5 mg, 1 patch, Transdermal, Q72H, Clarnce Flock, MD, 1.5 mg at 01/18/22 0859   sodium chloride flush (NS) 0.9 % injection 3 mL, 3 mL, Intravenous, Q12H, Clarnce Flock, MD, 3 mL at 01/19/22 1206   traZODone (DESYREL) tablet 50 mg, 50 mg, Oral, QHS PRN, Clarnce Flock, MD   valproic acid (DEPAKENE) 250 MG/5ML solution 750 mg, 750 mg, Per Tube, TID, Clarnce Flock, MD, 750 mg at 01/19/22 0854    ALLERGIES   Zestril [lisinopril]     REVIEW OF SYSTEMS    Review of Systems:  Gen:  Denies  fever, sweats, chills weigh loss  HEENT: Denies blurred vision, double vision, ear pain, eye pain, hearing loss, nose bleeds, sore throat Cardiac:  No dizziness, chest pain or heaviness, chest tightness,edema Resp:   reports dyspnea chronically  Gi: Denies swallowing difficulty, stomach pain, nausea or vomiting, diarrhea, constipation, bowel incontinence Gu:  Denies bladder incontinence, burning urine Ext:   Denies Joint pain, stiffness or swelling Skin: Denies  skin  rash, easy bruising or bleeding or hives Endoc:  Denies polyuria, polydipsia , polyphagia or weight change Psych:   Denies depression, insomnia or hallucinations   Other:  All other systems negative   VS: BP 107/79 (BP Location: Right Arm)   Pulse 88   Temp (!) 97.5 F (36.4 C)   Resp 16   Ht 6' (1.829 m)   Wt 94.7 kg   SpO2 100%   BMI 28.32 kg/m      PHYSICAL EXAM    GENERAL:NAD, no fevers, chills, no weakness no fatigue HEAD: Normocephalic, atraumatic.  EYES: Pupils equal, round, reactive to light. Extraocular muscles intact. No scleral icterus.  MOUTH: Moist mucosal membrane. Dentition intact. No abscess noted.  EAR, NOSE, THROAT: Clear without exudates. No external lesions. , +trache NECK: Supple. No thyromegaly. No nodules. No JVD.  PULMONARY: decreased breath sounds with mild rhonchi worse at bases bilaterally.  CARDIOVASCULAR: S1 and S2. Regular rate and rhythm. No murmurs, rubs, or gallops. No edema. Pedal pulses 2+ bilaterally.  GASTROINTESTINAL: Soft, nontender, nondistended. No masses. Positive bowel sounds. No hepatosplenomegaly.  MUSCULOSKELETAL: No swelling, clubbing, or edema. Range of motion full in all extremities.  NEUROLOGIC: Cranial nerves II through XII are intact. No gross focal neurological deficits. Sensation intact. Reflexes intact.  SKIN: No ulceration, lesions, rashes, or cyanosis. Skin warm and dry. Turgor intact.  PSYCHIATRIC: Mood, affect within normal limits. The patient is awake, alert and oriented x 3. Insight, judgment intact.       IMAGING     ASSESSMENT/PLAN   Acute on chronic hypoxemic respiratory failure -  due to possible tracheitis - Aspirate + for pseudomonas - have narrowed regimen to cipro per sensitivity  -he has been on agumentin for aspiration and completed therapy - his blood cultures have + staph epidermitis likely contaminant -his TTE shows severe systolic CHF with EF 31-49% -patient on scopolamine, oxycodone and  rubinol - s/p diuresis with negative 3L fluid balance now -s/p bronchoscopy with edematous and erythematous mucosa and bilateral mucus plugging.  BAL and therapeutic aspiration of tracheobroncheal tree was performed     Thank you for allowing me to participate in the care of this patient.   Patient/Family are satisfied with care plan and all questions have been answered.    Provider disclosure: Patient with at least one acute or chronic illness or injury that poses a threat to  life or bodily function and is being managed actively during this encounter.  All of the below services have been performed independently by signing provider:  review of prior documentation from internal and or external health records.  Review of previous and current lab results.  Interview and comprehensive assessment during patient visit today. Review of current and previous chest radiographs/CT scans. Discussion of management and test interpretation with health care team and patient/family.   This document was prepared using Dragon voice recognition software and may include unintentional dictation errors.     Ottie Glazier, M.D.  Division of Pulmonary & Critical Care Medicine

## 2022-01-19 NOTE — Progress Notes (Addendum)
Belenda Cruise with [facility] private duty nursing [phone]) had reported at 2:15 she was still working on staffing issues for weekend and would get back with this cSW in 30 minutes.   CSW has called x2, no answer or response from Edgar. She texted back to report that prior to hospitalization, that patient was receiving their private care services M-F day and Tuesday through Thursday night. That day nurse is no longer available for this case, so that is how they reached out to [facility] to assist who is also struggling with staffing. CSW spoke with [name] with [facility] at [phone] who reports he will look into their staffing abilities and get back with this CSW.   TOC supervisor made aware.   CSW has called Devona Konig with [facility] services at [phone] who requested referral be emailed to [email or web address] for him to identify if they have staffing to assist, pending response.    Darolyn Rua, [place], MSW, [place] [phone]

## 2022-01-19 NOTE — TOC Progression Note (Addendum)
Transition of Care [facility]) - Progression Note    Patient Details  Name: Billy Cherry MRN: [id number] Date of Birth: 1971/01/06  [facility]) CM/SW Contact  Colin Broach, LCSW Phone Number: 01/19/2022, 9:49 AM  Clinical Narrative:    SW returned call to Willette Alma, [place], to discuss pt discharge.  SW left vmail message for a return call.    9:56:  Lissa Hoard returned call.  SW informed her that pt does have PDN in place with [facility].  Lissa Hoard will reach out to family at another time.   Expected Discharge Plan: Home w Home Health Services (Private duty nursing) Barriers to Discharge: Continued Medical Work up  Expected Discharge Plan and Services Expected Discharge Plan: Home w Home Health Services (Private duty nursing)     Post Acute Care Choice: Resumption of Svcs/PTA Provider Living arrangements for the past 2 months: Single Family Home                                       Social Determinants of Health (SDOH) Interventions    Readmission Risk Interventions    01/08/2022    3:18 PM 04/14/2021   11:14 AM  Readmission Risk Prevention Plan  Transportation Screening Complete Complete  PCP or Specialist Appt within 3-5 Days Complete   HRI or Home Care Consult Complete Complete  Social Work Consult for Recovery Care Planning/Counseling Complete Complete  Palliative Care Screening Not Applicable Complete  Medication Review Oceanographer) Complete Complete

## 2022-01-19 NOTE — Discharge Summary (Signed)
Physician Discharge Summary  Randi College QQP:[id number] DOB: 1970-08-14 DOA: 01/05/2022  PCP: [facility] [place] date: 01/05/2022 Discharge date: 01/19/2022  Admitted From: Home Disposition:  Home.  Resumption of private duty nursing on discharge  Recommendations for Outpatient Follow-up:  Follow up with PCP in 1-2 weeks   Home Health:Resumption of private duty nursing on discharge Equipment/Devices:Trach mask, oxygen via mask, PEG tube  Discharge Condition:Stable CODE STATUS:FULL  Diet recommendation: Tube feeds only  Brief/Interim Summary: 51 y.o. male with medical history significant for anoxic brain injury and persistent vegetative state status post tracheostomy and PEG tube placement, chronic systolic CHF, hypertension, DVT on chronic anticoagulation, who presents with fever and tachycardia.  Patient had a thick secretion from trach tube, chest x-ray showed bilateral lower lobe atelectasis.  Patient also had a lactic acid level of 2.7. Admitted for sepsis likely from aspiration pneumonia placed on antibiotics cefepime, Flagyl.  Patient developed significant nausea vomiting on 11/4, KUB did while not see any acute changes.  Started on Reglan.  Patient wife also had GI symptoms, it appears that patient had gastroenteritis, causing nausea, subsequently caused aspiration pneumonia. Patient antibiotic was switched to Augmentin on 11/7.  Blood culture with Staph capitis in single single set likely contamination. Patient continued with full supportive care trach care respiratory care. Patient given few days of IV Lasix swelling has improved weight has returned to his baseline. Overall remains stable given his overall status and always at risk of aspiration/infection/pneumonia bedsores. Advise continued follow-up with his pulmonary team at [facility]. Pulm team consulted for increase in tracheal secretions. Patient had bronchoscopy on 11/11 showing b/l mucopurulent secretions with  erythema.    Patient remained stable post bronchscopy. Continued to require attentive and frequent nursing care.  Stable, with normal vitals and labs at time of discharge.     Discharge Diagnoses:  Principal Problem:   Severe sepsis ([place]) Active Problems:   Anoxic brain injury ([place])   DVT (deep venous thrombosis) ([name])   HTN (hypertension)   Chronic systolic CHF (congestive heart failure) ([name])   Tracheostomy dependent ([name])   S/P percutaneous endoscopic gastrostomy (PEG) tube placement ([name])   Aspiration pneumonia of both lower lobes ([name])   Gastroenteritis  Severe sepsis due to aspiration pneumonia Aspiration pneumonia Trachitis with increase tracheal secretions.  Bcx had grown Stap Capitis treated with Augmentin. Still concerns of infection, therefore tracheal secretions cultures ordered. Patient had bronchoscopy on 11/11 showing b/l mucopurulent secretions.  Tracheal aspirate growing GPC, GNR/Psuedomonas.  BAL cultures sent. Was on Zosyn.  BAL cultures show no result.  De-escalated to cipro.  Stable on day of dc. Will discharge on cipro via tube x 7 days   Pulmonary congestion Acute systolic CHF, ef 71% Initially was on diuretics to help with decongestion but not due to the signs of dehydration they are on hold.  Continue Coreg.  Dose reduced on discharge to 6.25 BID   AKI; resolved.  Elevated Cr, 0.8 > 2.1 > 0.92. Will hold diuretics. Diuretics held on DC   Mild Hyperkalemia Resolved   Acute gastroenteritis: Improved.  w/ loose BM.  Tolerating tube feeding no nausea vomiting.Continue bolus tube feeding.  At home on continues tube feeding, continue supportive care.  Dietitian following.   Borderline blood sugar level: Blood sugar at this time is stable, continue tube feeding.    Anoxic brain injury  and dependent on Trach/Peg, able to track but nonverbal. Continue trach collar and trach suctioning, tube feeding.  Continue home atropine eye  drops, Pepcid, Keppra Depakene.    DVT/PE hx: cont  home Eliquis   Hypertension BP fairly stable cont  hydralazine, Coreg  Discharge Instructions  Discharge Instructions     Increase activity slowly   Complete by: As directed       Allergies as of 01/19/2022       Reactions   Zestril [lisinopril] Swelling   angioedema        Medication List     TAKE these medications    acetaminophen 325 MG tablet Commonly known as: TYLENOL Place 2 tablets (650 mg total) into feeding tube every 6 (six) hours as needed for mild pain or fever.   albuterol (2.5 MG/3ML) 0.083% nebulizer solution Commonly known as: PROVENTIL Take 3 mLs (2.5 mg total) by nebulization every 6 (six) hours as needed for wheezing or shortness of breath.   apixaban 5 MG Tabs tablet Commonly known as: ELIQUIS Place 1 tablet (5 mg total) into feeding tube 2 (two) times daily.   atropine 1 % ophthalmic solution Place 2 drops under the tongue 4 (four) times daily.   carvedilol 6.25 MG tablet Commonly known as: COREG Place 1 tablet (6.25 mg total) into feeding tube 2 (two) times daily with a meal. What changed:  medication strength how much to take   chlorhexidine gluconate (MEDLINE KIT) 0.12 % solution Commonly known as: PERIDEX 15 mLs by Mouth Rinse route 2 (two) times daily.   ciprofloxacin 250 MG tablet Commonly known as: CIPRO Place 1 tablet (250 mg total) into feeding tube 2 (two) times daily for 7 days.   clonazePAM 0.5 MG tablet Commonly known as: KLONOPIN Place 1 tablet (0.5 mg total) into feeding tube 2 (two) times daily.   doxazosin 2 MG tablet Commonly known as: CARDURA Place 1 tablet (2 mg total) into feeding tube daily.   famotidine 20 MG tablet Commonly known as: PEPCID Place 1 tablet (20 mg total) into feeding tube 2 (two) times daily.   feeding supplement (OSMOLITE 1.5 CAL) Liqd Place 1,000 mLs into feeding tube continuous.   feeding supplement (PROSource TF) liquid Place 45 mLs into feeding tube 3 (three)  times daily.   fiber Pack packet Place 1 packet into feeding tube 2 (two) times daily.   fluticasone 50 MCG/ACT nasal spray Commonly known as: FLONASE Place 2 sprays into both nostrils daily.   free water Soln Place 200 mLs into feeding tube every 6 (six) hours.   GaviLAX 17 GM/SCOOP powder Generic drug: polyethylene glycol powder Place 1 capful (17 g) into feeding tube daily as needed for mild constipation or moderate constipation.   glycopyrrolate 1 MG tablet Commonly known as: ROBINUL Place 1 tablet (1 mg total) into feeding tube 3 (three) times daily.   hydrALAZINE 50 MG tablet Commonly known as: APRESOLINE Place 1 tablet (50 mg total) into feeding tube 3 (three) times daily.   ipratropium-albuterol 0.5-2.5 (3) MG/3ML Soln Commonly known as: DUONEB TAKE 3 MLS BY NEBULIZATION EVERY 4 (FOUR) HOURS AS NEEDED (WHEEZING, SHORTNESS OF BREATH). USE FIRST BEFORE ALBUTEROL   isosorbide dinitrate 30 MG tablet Commonly known as: ISORDIL PLACE 1 TABLET (30 MG TOTAL) INTO FEEDING TUBE 3 (THREE) TIMES DAILY.   levETIRAcetam 100 MG/ML solution Commonly known as: KEPPRA PLACE 7.5 MLS (750 MG TOTAL) INTO FEEDING TUBE 2 (TWO) TIMES DAILY.   Loperamide HCl 1 MG/7.5ML Liqd Place 15 mLs (2 mg total) into feeding tube daily as needed for diarrhea or loose stools.   mouth rinse Liqd solution 15  mLs by Mouth Rinse route every 4 (four) hours.   Nebulizer System All-In-One Misc 1 Inhalation by Does not apply route every hour as needed.   scopolamine 1 MG/3DAYS Commonly known as: TRANSDERM-SCOP Place 1 patch (1.5 mg total) onto the skin every 3 (three) days.   spironolactone 25 MG tablet Commonly known as: ALDACTONE Place 1 tablet (25 mg total) into feeding tube daily.   valproic acid 250 MG/5ML solution Commonly known as: DEPAKENE Place 15 mLs (750 mg total) into feeding tube 3 (three) times daily.   ZINC OXIDE (TOPICAL) 25 % Oint Apply 1 Application topically 2 (two) times daily.  To affected area.        Allergies  Allergen Reactions   Zestril [Lisinopril] Swelling    angioedema    Consultations: Pulmonary   Procedures/Studies: DG Chest Port 1 View  Result Date: 01/18/2022 CLINICAL DATA:  Atelectasis, tracheostomy EXAM: PORTABLE CHEST 1 VIEW COMPARISON:  01/05/2022 FINDINGS: Tracheostomy is unchanged. Cardiomegaly, vascular congestion. Very low lung volumes with perihilar opacities, favor edema. No effusions or pneumothorax. No acute bony abnormality. IMPRESSION: Cardiomegaly, vascular congestion, probable mild pulmonary edema. Very low lung volumes. Electronically Signed   By: Rolm Baptise M.D.   On: 01/18/2022 19:47   DG Abd 1 View  Result Date: 01/07/2022 CLINICAL DATA:  Nausea and vomiting for 2 days EXAM: ABDOMEN - 1 VIEW COMPARISON:  08/15/2021 FINDINGS: Gastrostomy catheter is noted in place. No free air is noted. Scattered large and small bowel gas is seen. Chronic bony changes are noted about the right hip. No acute bony abnormality is seen. IMPRESSION: No acute abnormality noted. Electronically Signed   By: Inez Catalina M.D.   On: 01/07/2022 09:53   DG Neck Soft Tissue  Result Date: 01/06/2022 CLINICAL DATA:  Evaluate tracheostomy EXAM: NECK SOFT TISSUES - 1+ VIEW COMPARISON:  Chest radiographs done on 01/05/2022 FINDINGS: Tip of tracheostomy is 4.2 cm above the carina. There is no significant narrowing of trachea. There is no distention of hypopharynx. Epiglottis is not optimally visualized. No opaque foreign bodies are seen. Degenerative changes are noted in mid and lower cervical spine. IMPRESSION: Tip of tracheostomy is 4.2 cm above the carina. There is no significant narrowing of trachea. Epiglottis is not adequately visualized. Cervical spondylosis. Electronically Signed   By: Elmer Picker M.D.   On: 01/06/2022 16:43   DG Chest Port 1 View  Result Date: 01/05/2022 CLINICAL DATA:  Questionable sepsis - evaluate for abnormality EXAM:  PORTABLE CHEST 1 VIEW COMPARISON:  Radiograph 09/04/2021 FINDINGS: Tracheostomy tube tip at the thoracic inlet. Persistent low lung volumes. Stable heart size and mediastinal contours. Subsegmental atelectasis in the lung bases. No confluent consolidation. No pleural fluid, pneumothorax or pulmonary edema. Stable osseous structures. IMPRESSION: 1. Low lung volumes with bibasilar atelectasis. 2. Tracheostomy tube tip at the thoracic inlet. Electronically Signed   By: Keith Rake M.D.   On: 01/05/2022 23:11      Subjective: Seen and examined on day of dc.  Stable,in no visible distress  Discharge Exam: Vitals:   01/19/22 0440 01/19/22 0811  BP: (!) 122/92 124/86  Pulse: 86 80  Resp: 16   Temp: 99.2 F (37.3 C) (!) 97.5 F (36.4 C)  SpO2: 100% 99%   Vitals:   01/19/22 0042 01/19/22 0435 01/19/22 0440 01/19/22 0811  BP:   (!) 122/92 124/86  Pulse:   86 80  Resp:   16   Temp:   99.2 F (37.3 C) (!) 97.5 F (  36.4 C)  TempSrc:   Axillary   SpO2: 100% 100% 100% 99%  Weight:      Height:        General exam: Appears chronically ill, no visible distress Respiratory system: Coarse breath sounds, trach mask 5L Cardiovascular system: S1S2, RRR, no murmur Gastrointestinal system: Soft ND, +Peg Central nervous system: Oriented x 0 Extremities: Symmetric 0 x 5 power. Skin: No rashes, lesions or ulcers Psychiatry: Unable to assess    The results of significant diagnostics from this hospitalization (including imaging, microbiology, ancillary and laboratory) are listed below for reference.     Microbiology: Recent Results (from the past 240 hour(s))  Culture, Respiratory w Gram Stain     Status: None   Collection Time: 01/12/22  4:15 PM   Specimen: Tracheal Aspirate; Respiratory  Result Value Ref Range Status   Specimen Description   Final    TRACHEAL ASPIRATE Performed at [facility], [address] [place], [place] [address]    Special Requests   Final     NONE Performed at [facility], [place], [place] [address]    Gram Stain   Final    RARE WBC PRESENT, PREDOMINANTLY MONONUCLEAR FEW GRAM POSITIVE COCCI IN PAIRS FEW GRAM NEGATIVE RODS Performed at [facility], [place] [address] [place], [place] [address]    Culture MODERATE PSEUDOMONAS AERUGINOSA  Final   Report Status 01/16/2022 FINAL  Final   Organism ID, Bacteria PSEUDOMONAS AERUGINOSA  Final      Susceptibility   Pseudomonas aeruginosa - MIC*    CEFTAZIDIME <=1 SENSITIVE Sensitive     CIPROFLOXACIN <=0.25 SENSITIVE Sensitive     GENTAMICIN <=1 SENSITIVE Sensitive     IMIPENEM 2 SENSITIVE Sensitive     PIP/TAZO 16 SENSITIVE Sensitive     CEFEPIME 2 SENSITIVE Sensitive     * MODERATE PSEUDOMONAS AERUGINOSA  Acid Fast Smear (AFB)     Status: None   Collection Time: 01/13/22  3:29 PM   Specimen: Bronchial Washing, Left; Respiratory  Result Value Ref Range Status   AFB Specimen Processing Concentration  Final   Acid Fast Smear Negative  Final    Comment: (NOTE) Performed At: [facility] [place], [place] [id number] Rush Farmer MD IW:[id number]    Source (AFB) SPU  Final    Comment: Performed at [facility], [address] [place], [place] [address]     Labs: BNP (last 3 results) Recent Labs    06/06/21 0625  BNP 14.4   Basic Metabolic Panel: Recent Labs  Lab 01/15/22 0513 01/16/22 0658 01/17/22 0552 01/18/22 0536 01/19/22 0650  NA 136 138 140 138 138  K 5.2* 4.8 5.7* 4.5 4.5  CL 104 99 102 98 96*  CO2 25 32 27 31 34*  GLUCOSE 139* 95 98 98 88  BUN 59* 32* 31* 28* 33*  CREATININE 2.11* 0.92 0.84 0.83 0.93  CALCIUM 9.3 9.4 9.8 9.5 9.6  MG 2.6* 2.1 2.2 2.0 2.1   Liver Function Tests: No results for input(s): "AST", "ALT", "ALKPHOS", "BILITOT", "PROT", "ALBUMIN" in the last 168 hours. No results for input(s): "LIPASE", "AMYLASE" in the last 168 hours. No results for input(s): "AMMONIA" in  the last 168 hours. CBC: Recent Labs  Lab 01/15/22 0513 01/16/22 0658 01/17/22 0552 01/18/22 0536 01/19/22 0650  WBC 21.1* 6.3 5.9 7.8 7.6  HGB 15.1 12.1* 13.7 11.6* 12.1*  HCT 45.2 37.7* 42.0 36.1* 37.2*  MCV 89.5 97.2  97.7 98.9 97.1  PLT 281 258 141* 268 257   Cardiac Enzymes: No results for input(s): "CKTOTAL", "CKMB", "CKMBINDEX", "TROPONINI" in the last 168 hours. BNP: Invalid input(s): "POCBNP" CBG: Recent Labs  Lab 01/18/22 1626 01/18/22 2137 01/19/22 0035 01/19/22 0453 01/19/22 0812  GLUCAP 125* 148* 105* 106* 77   D-Dimer No results for input(s): "DDIMER" in the last 72 hours. Hgb A1c No results for input(s): "HGBA1C" in the last 72 hours. Lipid Profile No results for input(s): "CHOL", "HDL", "LDLCALC", "TRIG", "CHOLHDL", "LDLDIRECT" in the last 72 hours. Thyroid function studies No results for input(s): "TSH", "T4TOTAL", "T3FREE", "THYROIDAB" in the last 72 hours.  Invalid input(s): "FREET3" Anemia work up No results for input(s): "VITAMINB12", "FOLATE", "FERRITIN", "TIBC", "IRON", "RETICCTPCT" in the last 72 hours. Urinalysis    Component Value Date/Time   COLORURINE YELLOW (A) 01/05/2022 2236   APPEARANCEUR CLEAR (A) 01/05/2022 2236   LABSPEC 1.018 01/05/2022 2236   PHURINE 8.0 01/05/2022 2236   GLUCOSEU NEGATIVE 01/05/2022 2236   HGBUR NEGATIVE 01/05/2022 2236   BILIRUBINUR NEGATIVE 01/05/2022 2236   KETONESUR NEGATIVE 01/05/2022 2236   PROTEINUR NEGATIVE 01/05/2022 2236   NITRITE NEGATIVE 01/05/2022 2236   LEUKOCYTESUR NEGATIVE 01/05/2022 2236   Sepsis Labs Recent Labs  Lab 01/16/22 0658 01/17/22 0552 01/18/22 0536 01/19/22 0650  WBC 6.3 5.9 7.8 7.6   Microbiology Recent Results (from the past 240 hour(s))  Culture, Respiratory w Gram Stain     Status: None   Collection Time: 01/12/22  4:15 PM   Specimen: Tracheal Aspirate; Respiratory  Result Value Ref Range Status   Specimen Description   Final    TRACHEAL ASPIRATE Performed at  [facility], [address] [place], [place] [address]    Special Requests   Final    NONE Performed at [facility], [place], [place] [address]    Gram Stain   Final    RARE WBC PRESENT, PREDOMINANTLY MONONUCLEAR FEW GRAM POSITIVE COCCI IN PAIRS FEW GRAM NEGATIVE RODS Performed at [facility], [place] [address], [place], [place] [address]    Culture MODERATE PSEUDOMONAS AERUGINOSA  Final   Report Status 01/16/2022 FINAL  Final   Organism ID, Bacteria PSEUDOMONAS AERUGINOSA  Final      Susceptibility   Pseudomonas aeruginosa - MIC*    CEFTAZIDIME <=1 SENSITIVE Sensitive     CIPROFLOXACIN <=0.25 SENSITIVE Sensitive     GENTAMICIN <=1 SENSITIVE Sensitive     IMIPENEM 2 SENSITIVE Sensitive     PIP/TAZO 16 SENSITIVE Sensitive     CEFEPIME 2 SENSITIVE Sensitive     * MODERATE PSEUDOMONAS AERUGINOSA  Acid Fast Smear (AFB)     Status: None   Collection Time: 01/13/22  3:29 PM   Specimen: Bronchial Washing, Left; Respiratory  Result Value Ref Range Status   AFB Specimen Processing Concentration  Final   Acid Fast Smear Negative  Final    Comment: (NOTE) Performed At: [facility] [place], [place] [id number] Rush Farmer MD BL:[id number]    Source (AFB) SPU  Final    Comment: Performed at [facility], [address], [place], [place] [address]     Time coordinating discharge: Over 30 minutes  SIGNED:   Sidney Ace, MD  [facility] 01/19/2022, 11:59 AM Pager   If 7PM-7AM, please contact night-coverage

## 2022-01-19 NOTE — TOC Progression Note (Addendum)
Transition of Care [facility]) - Progression Note    Patient Details  Name: Ansh Fauble MRN: [id number] Date of Birth: 23-Mar-1970  Transition of Care [facility]) CM/SW Contact  Darolyn Rua, [place] Phone Number: 01/19/2022, 11:43 AM  Clinical Narrative:     Patient was receiving private duty nursing through [facility] prior to admission. CSW contacted Belenda Cruise at [phone].   She resports she is workign on staffing however is uncertain if she can secure weekend staffign for patient's needs. She reports seh believes patients' wife would not be agreeable to patient discharging over the weekend without secured private duty nursing.   Pending call back from Maynard with updates on staffing. MD informed.   Per Belenda Cruise will need  MD note resumption of private duty nursing on discharge summary and fax it to [phone] - this has been faxed.   Expected Discharge Plan: Home w Home Health Services (Private duty nursing) Barriers to Discharge: Continued Medical Work up  Expected Discharge Plan and Services Expected Discharge Plan: Home w Home Health Services (Private duty nursing)     Post Acute Care Choice: Resumption of Svcs/PTA Provider Living arrangements for the past 2 months: Single Family Home Expected Discharge Date: 01/19/22                                     Social Determinants of Health (SDOH) Interventions    Readmission Risk Interventions    01/08/2022    3:18 PM 04/14/2021   11:14 AM  Readmission Risk Prevention Plan  Transportation Screening Complete Complete  PCP or Specialist Appt within 3-5 Days Complete   HRI or Home Care Consult Complete Complete  Social Work Consult for Recovery Care Planning/Counseling Complete Complete  Palliative Care Screening Not Applicable Complete  Medication Review Oceanographer) Complete Complete

## 2022-01-20 DIAGNOSIS — A419 Sepsis, unspecified organism: Secondary | ICD-10-CM | POA: Diagnosis not present

## 2022-01-20 DIAGNOSIS — R6520 Severe sepsis without septic shock: Secondary | ICD-10-CM | POA: Diagnosis not present

## 2022-01-20 DIAGNOSIS — I5022 Chronic systolic (congestive) heart failure: Secondary | ICD-10-CM | POA: Diagnosis not present

## 2022-01-20 DIAGNOSIS — G931 Anoxic brain damage, not elsewhere classified: Secondary | ICD-10-CM | POA: Diagnosis not present

## 2022-01-20 LAB — BASIC METABOLIC PANEL
Anion gap: 9 (ref 5–15)
BUN: 41 mg/dL — ABNORMAL HIGH (ref 6–20)
CO2: 32 mmol/L (ref 22–32)
Calcium: 9.5 mg/dL (ref 8.9–10.3)
Chloride: 96 mmol/L — ABNORMAL LOW (ref 98–111)
Creatinine, Ser: 1.19 mg/dL (ref 0.61–1.24)
GFR, Estimated: >60 mL/min (ref >60)
Glucose, Bld: 94 mg/dL (ref 70–99)
Potassium: 4.9 mmol/L (ref 3.5–5.1)
Sodium: 137 mmol/L (ref 135–145)

## 2022-01-20 LAB — CBC
HCT: 37.4 % — ABNORMAL LOW (ref 39.0–52.0)
Hemoglobin: 12.2 g/dL — ABNORMAL LOW (ref 13.0–17.0)
MCH: 31.5 pg (ref 26.0–34.0)
MCHC: 32.6 g/dL (ref 30.0–36.0)
MCV: 96.6 fL (ref 80.0–100.0)
Platelets: 284 10*3/uL (ref 150–400)
RBC: 3.87 MIL/uL — ABNORMAL LOW (ref 4.22–5.81)
RDW: 13.7 % (ref 11.5–15.5)
WBC: 7.4 10*3/uL (ref 4.0–10.5)
nRBC: 0 % (ref 0.0–0.2)

## 2022-01-20 LAB — GLUCOSE, CAPILLARY
Glucose-Capillary: 105 mg/dL — ABNORMAL HIGH (ref 70–99)
Glucose-Capillary: 88 mg/dL (ref 70–99)
Glucose-Capillary: 101 mg/dL — ABNORMAL HIGH (ref 70–99)
Glucose-Capillary: 81 mg/dL (ref 70–99)
Glucose-Capillary: 117 mg/dL — ABNORMAL HIGH (ref 70–99)
Glucose-Capillary: 103 mg/dL — ABNORMAL HIGH (ref 70–99)
Glucose-Capillary: 77 mg/dL (ref 70–99)

## 2022-01-20 LAB — MAGNESIUM: Magnesium: 2.3 mg/dL (ref 1.7–2.4)

## 2022-01-20 NOTE — Progress Notes (Signed)
PROGRESS NOTE    Billy Cherry  JSE:[id number] DOB: December 22, 1970 DOA: 01/05/2022 PCP: [facility]    Brief Narrative:  51 y.o. male with medical history significant for anoxic brain injury and persistent vegetative state status post tracheostomy and PEG tube placement, chronic systolic CHF, hypertension, DVT on chronic anticoagulation, who presents with fever and tachycardia.  Patient had a thick secretion from trach tube, chest x-ray showed bilateral lower lobe atelectasis.  Patient also had a lactic acid level of 2.7. Admitted for sepsis likely from aspiration pneumonia placed on antibiotics cefepime, Flagyl.  Patient developed significant nausea vomiting on 11/4, KUB did while not see any acute changes.  Started on Reglan.  Patient wife also had GI symptoms, it appears that patient had gastroenteritis, causing nausea, subsequently caused aspiration pneumonia. Patient antibiotic was switched to Augmentin on 11/7.  Blood culture with Staph capitis in single single set likely contamination. Patient continued with full supportive care trach care respiratory care. Patient given few days of IV Lasix swelling has improved weight has returned to his baseline. Overall remains stable given his overall status and always at risk of aspiration/infection/pneumonia bedsores. Advise continued follow-up with his pulmonary team at [facility]. Pulm team consulted for increase in tracheal secretions. Patient had bronchoscopy on 11/11 showing b/l mucopurulent secretions with erythema.  Plan had been for patient to be discharged to home with home health on 11/17, however due to staffing issues, they are unable to provide support until 11/21.  Unable to staff on the weekend.  Patient's wife not comfortable taking care of him without additional support.   Assessment & Plan:   Principal Problem:   Severe sepsis ([place]) Active Problems:   Anoxic brain injury ([place])   DVT (deep venous thrombosis) ([name])   HTN  (hypertension)   Chronic systolic CHF (congestive heart failure) ([name])   Tracheostomy dependent ([name])   S/P percutaneous endoscopic gastrostomy (PEG) tube placement ([name])   Aspiration pneumonia of both lower lobes ([name])   Gastroenteritis  Severe sepsis due to aspiration pneumonia Aspiration pneumonia Trachitis with increase tracheal secretions.  Bcx had grown Stap Capitis treated with Augmentin. Still concerns of infection, therefore tracheal secretions cultures ordered. Patient had bronchoscopy on 11/11 showing b/l mucopurulent secretions.  Tracheal aspirate growing GPC, GNR/Psuedomonas.  BAL cultures sent. Was on Zosyn.  BAL cultures show no result.  De-escalated to cipro.  If stable, anticipate dc tomorrow AM.   Pulmonary congestion Acute systolic CHF, ef 07% Initially was on diuretics to help with decongestion but not due to the signs of dehydration they are on hold.  Continue Coreg.   AKI; resolved.  Elevated Cr, 0.8 > 2.1 > 0.92. Will hold diuretics.    Mild Hyperkalemia Resolved   Acute gastroenteritis: Improved.  w/ loose BM.  Tolerating tube feeding no nausea vomiting.Continue bolus tube feeding.  At home on continues tube feeding, continue supportive care.  Dietitian following.   Borderline blood sugar level: Blood sugar at this time is stable, continue tube feeding.    Anoxic brain injury  and dependent on Trach/Peg, able to track but nonverbal. Continue trach collar and trach suctioning, tube feeding.  Continue home atropine eye drops, Pepcid, Keppra Depakene.   DVT/PE hx: cont  home Eliquis   Hypertension BP fairly stable cont  hydralazine, Coreg  DVT prophylaxis: SCDs, eliquyis Code Status: FULL Family Communication: We will call wife. Disposition Plan: Status is: Inpatient Remains inpatient appropriate because: Aspiration pnuemonia.  Doing clinically well. Anticipate dc 11/17   Level  of care: Telemetry Cardiac  Consultants:  Pulmonary  Procedures:   Bronchoscopy  Antimicrobials: Cipro   Subjective: No acute distress.,  Noted mild tachycardia  Objective: Vitals:   01/19/22 2330 01/20/22 0338 01/20/22 0757 01/20/22 1115  BP: (!) 126/94 110/83 (!) 126/94 (!) 158/92  Pulse: (!) 101 88 92 (!) 105  Resp: _0 Temp: 98.6 F (37 C) 98.2 F (36.8 C) 98.2 F (36.8 C) 98 F (36.7 C)  TempSrc: Oral Oral Oral Oral  SpO2: 97% 99% 96% 99%  Weight:      Height:        Intake/Output Summary (Last 24 hours) at 01/20/2022 1506 Last data filed at 01/20/2022 4580 Gross per 24 hour  Intake 20 ml  Output 1050 ml  Net -1030 ml    Filed Weights   01/16/22 0500 01/17/22 0519 01/18/22 0500  Weight: 93 kg 95.8 kg 94.7 kg    Examination:  General exam: Appears chronically ill, no visible distress Respiratory system: Coarse breath sounds, trach mask 5L Cardiovascular system: S1S2, RRR, borderline tachycardia Gastrointestinal system: Soft ND, +Peg Central nervous system: Oriented x 0 Extremities: Symmetric 0 x 5 power. Skin: No rashes, lesions or ulcers Psychiatry: Unable to assess    Data Reviewed: I have personally reviewed following labs and imaging studies Hemoglobin about the same from previous day. CBC: Recent Labs  Lab 01/16/22 0658 01/17/22 0552 01/18/22 0536 01/19/22 0650 01/20/22 0521  WBC 6.3 5.9 7.8 7.6 7.4  HGB 12.1* 13.7 11.6* 12.1* 12.2*  HCT 37.7* 42.0 36.1* 37.2* 37.4*  MCV 97.2 97.7 98.9 97.1 96.6  PLT 258 141* 268 257 998    Basic Metabolic Panel: Recent Labs  Lab 01/16/22 0658 01/17/22 0552 01/18/22 0536 01/19/22 0650 01/20/22 0521  NA 138 140 138 138 137  K 4.8 5.7* 4.5 4.5 4.9  CL 99 102 98 96* 96*  CO2 32 27 31 34* 32  GLUCOSE 95 98 98 88 94  BUN 32* 31* 28* 33* 41*  CREATININE 0.92 0.84 0.83 0.93 1.19  CALCIUM 9.4 9.8 9.5 9.6 9.5  MG 2.1 2.2 2.0 2.1 2.3    GFR: Estimated Creatinine Clearance: 87.7 mL/min (by C-G formula based on SCr of 1.19 mg/dL). Liver Function  Tests: No results for input(s): "AST", "ALT", "ALKPHOS", "BILITOT", "PROT", "ALBUMIN" in the last 168 hours. No results for input(s): "LIPASE", "AMYLASE" in the last 168 hours. No results for input(s): "AMMONIA" in the last 168 hours. Coagulation Profile: No results for input(s): "INR", "PROTIME" in the last 168 hours. Cardiac Enzymes: No results for input(s): "CKTOTAL", "CKMB", "CKMBINDEX", "TROPONINI" in the last 168 hours. BNP (last 3 results) No results for input(s): "PROBNP" in the last 8760 hours. HbA1C: No results for input(s): "HGBA1C" in the last 72 hours. CBG: Recent Labs  Lab 01/20/22 0115 01/20/22 0322 01/20/22 0336 01/20/22 0755 01/20/22 1112  GLUCAP 105* 88 101* 81 117*    Lipid Profile: No results for input(s): "CHOL", "HDL", "LDLCALC", "TRIG", "CHOLHDL", "LDLDIRECT" in the last 72 hours. Thyroid Function Tests: No results for input(s): "TSH", "T4TOTAL", "FREET4", "T3FREE", "THYROIDAB" in the last 72 hours. Anemia Panel: No results for input(s): "VITAMINB12", "FOLATE", "FERRITIN", "TIBC", "IRON", "RETICCTPCT" in the last 72 hours. Sepsis Labs: No results for input(s): "PROCALCITON", "LATICACIDVEN" in the last 168 hours.  Recent Results (from the past 240 hour(s))  Culture, Respiratory w Gram Stain     Status: None   Collection Time: 01/12/22  4:15 PM   Specimen: Tracheal Aspirate; Respiratory  Result Value Ref Range Status   Specimen Description   Final    TRACHEAL ASPIRATE Performed at [facility], [address] [place], [place] [address]    Special Requests   Final    NONE Performed at [facility], [place], [place] [address]    Gram Stain   Final    RARE WBC PRESENT, PREDOMINANTLY MONONUCLEAR FEW GRAM POSITIVE COCCI IN PAIRS FEW GRAM NEGATIVE RODS Performed at [facility], [place] [address], [place], [place] [address]    Culture MODERATE PSEUDOMONAS AERUGINOSA  Final   Report Status 01/16/2022 FINAL   Final   Organism ID, Bacteria PSEUDOMONAS AERUGINOSA  Final      Susceptibility   Pseudomonas aeruginosa - MIC*    CEFTAZIDIME <=1 SENSITIVE Sensitive     CIPROFLOXACIN <=0.25 SENSITIVE Sensitive     GENTAMICIN <=1 SENSITIVE Sensitive     IMIPENEM 2 SENSITIVE Sensitive     PIP/TAZO 16 SENSITIVE Sensitive     CEFEPIME 2 SENSITIVE Sensitive     * MODERATE PSEUDOMONAS AERUGINOSA  Acid Fast Smear (AFB)     Status: None   Collection Time: 01/13/22  3:29 PM   Specimen: Bronchial Washing, Left; Respiratory  Result Value Ref Range Status   AFB Specimen Processing Concentration  Final   Acid Fast Smear Negative  Final    Comment: (NOTE) Performed At: [facility] [address] [place], [place] [id number] Rush Farmer MD HW:[id number]    Source (AFB) SPU  Final    Comment: Performed at [facility], [address], [place], [place] [address]         Radiology Studies: DG Chest Port 1 View  Result Date: 01/18/2022 CLINICAL DATA:  Atelectasis, tracheostomy EXAM: PORTABLE CHEST 1 VIEW COMPARISON:  01/05/2022 FINDINGS: Tracheostomy is unchanged. Cardiomegaly, vascular congestion. Very low lung volumes with perihilar opacities, favor edema. No effusions or pneumothorax. No acute bony abnormality. IMPRESSION: Cardiomegaly, vascular congestion, probable mild pulmonary edema. Very low lung volumes. Electronically Signed   By: Rolm Baptise M.D.   On: 01/18/2022 19:47        Scheduled Meds:  apixaban  5 mg Oral BID   atropine  2 drop Sublingual QID   carvedilol  3.125 mg Per Tube BID WC   Chlorhexidine Gluconate Cloth  6 each Topical Daily   ciprofloxacin  250 mg Oral BID   clonazePAM  0.5 mg Per Tube BID   famotidine  20 mg Per Tube Daily   feeding supplement (OSMOLITE 1.5 CAL)  355 mL Per Tube QID   feeding supplement (PROSource TF20)  60 mL Per Tube Daily   fiber  1 packet Per Tube BID   free water  200 mL Per Tube Q6H   glycopyrrolate  1 mg Per Tube TID    hydrALAZINE  50 mg Per Tube TID   levETIRAcetam  750 mg Per Tube BID   mouth rinse  15 mL Mouth Rinse 4 times per day   pantoprazole (PROTONIX) IV  40 mg Intravenous Daily   scopolamine  1 patch Transdermal Q72H   sodium chloride flush  3 mL Intravenous Q12H   valproic acid  750 mg Per Tube TID   Continuous Infusions:   LOS: 14 days      Annita Brod, MD [facility]   If 7PM-7AM, please contact night-coverage  01/20/2022, 3:06 PM

## 2022-01-20 NOTE — Progress Notes (Signed)
PULMONOLOGY         Date: 01/20/2022,   MRN# [id number] Billy Cherry [birth year]     AdmissionWeight: 108.9 kg                 CurrentWeight: 94.7 kg  Referring provider: [place]:   Acute on chronic hypoxemic respiratory failure    HISTORY OF PRESENT ILLNESS   Billy Cherry is a 51 y.o. male with medical history significant for anoxic brain injury and persistent vegetative state status post tracheostomy and PEG tube placement, chronic systolic CHF, hypertension, DVT on chronic anticoagulation, who presents with fever and tachycardia.Patient was brought in from home due to fever, tachycardia, and thick secretions from his tracheostomy tube.  No additional history given. Patient was febrile on arrival with tachypnea and tacchycardia and hypoxemia.  CBC notable for white count of 17, mild anemia with hemoglobin 12.2, CMP was unremarkable.  Lactic acid was elevated at 2.5, chest x-ray showed low lung volumes and no obvious infiltrates.  Blood and urine cultures were obtained.  Respiratory viral panel was obtained.  He was given a dose of azithromycin, cefepime, vancomycin, 1 L NS bolus, and admitted for further management. He had CT chest abd and pelvis in June 2023 with findings of pulmonary interstitial edema bilaterally at moderate severity, he had CXR on arrival with findings of poor inspiratory effort, mild pulmonary edema.   01/13/22- patient with persistent high volume mucopuruent secretions despite antibiotics.  Will perform airway inspection and bronchial washings for microbiology today.  01/14/22 - patient doing well on 5L/min seems to be coughing less with less secretions.  Reviewed medical plan with Dr Reesa Chew today.  01/17/22- patient seen at bedside, non tachypneic on 5L/min. Trache collar, , reviewed treatment for pseudomonas + tracheitis, therapy changed per sensitivity panel to Cipro BID.  This may just be colonization but difficult to tell since  patient is non verbal and unable to relate symptomatology, he does not have leukocytosis, fever, tachycardia.   01/18/22 -patient seen at bedside, trache secrections are unchanged from yesterday. He is on cipro BID.  He is stable non labored breathing , no accessory muscle use, non tachypneic , at 5L/min.  01/19/22-  patient has been diuresed and is less ronchorous, negative 4L on fluid balance.  He is on cipro and may continue this for 5 more days to help with pseudomonas tracheitis although still were not completely sure if this is a chronic colonizer which may be incidentally found.   01/20/22 - patient is resting in bed comfortably.  He is asleep nonlabored breathing during my evaluation. On ausucultation lungs sound less rhonchorous , patient on trache collar 5L/min   PAST MEDICAL HISTORY   Past Medical History:  Diagnosis Date   Hypertension      SURGICAL HISTORY   Past Surgical History:  Procedure Laterality Date   BACK SURGERY     CORONARY/GRAFT ACUTE MI REVASCULARIZATION N/A 03/23/2021   Procedure: Coronary/Graft Acute MI Revascularization;  Surgeon: Isaias Cowman, MD;  Location: [facility];  Service: Cardiovascular;  Laterality: N/A;   IR GASTROSTOMY TUBE MOD SED  04/26/2021   LEFT HEART CATH AND CORONARY ANGIOGRAPHY N/A 03/23/2021   Procedure: LEFT HEART CATH AND CORONARY ANGIOGRAPHY;  Surgeon: Isaias Cowman, MD;  Location: [facility];  Service: Cardiovascular;  Laterality: N/A;   SHOULDER SURGERY       FAMILY HISTORY   No family history on file.   SOCIAL  HISTORY   Social History   Tobacco Use   Smoking status: Never   Smokeless tobacco: Never  Vaping Use   Vaping Use: Unknown  Substance Use Topics   Alcohol use: Yes   Drug use: Never     MEDICATIONS    Home Medication:    Current Medication:  Current Facility-Administered Medications:    acetaminophen (TYLENOL) tablet 650 mg, 650 mg, Oral, Q6H PRN **OR** acetaminophen  (TYLENOL) suppository 650 mg, 650 mg, Rectal, Q6H PRN, Clarnce Flock, MD   apixaban Arne Cleveland) tablet 5 mg, 5 mg, Oral, BID, Sreenath, Sudheer B, MD, 5 mg at 01/20/22 1008   atropine 1 % ophthalmic solution 2 drop, 2 drop, Sublingual, QID, Clarnce Flock, MD, 2 drop at 01/20/22 1009   carvedilol (COREG) tablet 3.125 mg, 3.125 mg, Per Tube, BID WC, Lanney Gins, Bernarda Erck, MD, 3.125 mg at 01/20/22 1007   Chlorhexidine Gluconate Cloth 2 % PADS 6 each, 6 each, Topical, Daily, Sharen Hones, MD, 6 each at 01/20/22 1009   ciprofloxacin (CIPRO) tablet 250 mg, 250 mg, Oral, BID, Ottie Glazier, MD, 250 mg at 01/20/22 0550   clonazePAM (KLONOPIN) tablet 0.5 mg, 0.5 mg, Per Tube, BID, Clarnce Flock, MD, 0.5 mg at 01/20/22 1007   famotidine (PEPCID) tablet 20 mg, 20 mg, Per Tube, Daily, Beers, Shanon Brow, RPH, 20 mg at 01/20/22 1007   feeding supplement (OSMOLITE 1.5 CAL) liquid 355 mL, 355 mL, Per Tube, QID, Sharen Hones, MD, 355 mL at 01/20/22 1010   feeding supplement (PROSource TF20) liquid 60 mL, 60 mL, Per Tube, Daily, Sharen Hones, MD, 60 mL at 01/19/22 1206   fiber (NUTRISOURCE FIBER) 1 packet, 1 packet, Per Tube, BID, Clarnce Flock, MD, 1 packet at 01/20/22 1007   free water 200 mL, 200 mL, Per Tube, Q6H, Sharen Hones, MD, 200 mL at 01/20/22 1010   glucagon (human recombinant) (GLUCAGEN) injection 1 mg, 1 mg, Intravenous, Once PRN, Amin, Ankit Chirag, MD   glycopyrrolate (ROBINUL) tablet 1 mg, 1 mg, Per Tube, TID, Clarnce Flock, MD, 1 mg at 01/20/22 1007   guaiFENesin (ROBITUSSIN) 100 MG/5ML liquid 5 mL, 5 mL, Oral, Q4H PRN, Amin, Ankit Chirag, MD   hydrALAZINE (APRESOLINE) injection 10 mg, 10 mg, Intravenous, Q4H PRN, Amin, Ankit Chirag, MD   hydrALAZINE (APRESOLINE) tablet 50 mg, 50 mg, Per Tube, TID, Clarnce Flock, MD, 50 mg at 01/20/22 1010   ipratropium-albuterol (DUONEB) 0.5-2.5 (3) MG/3ML nebulizer solution 3 mL, 3 mL, Nebulization, Q4H PRN, Clarnce Flock, MD    levETIRAcetam (KEPPRA) 100 MG/ML solution 750 mg, 750 mg, Per Tube, BID, Clarnce Flock, MD, 750 mg at 01/20/22 1007   loperamide HCl (IMODIUM) 1 MG/7.5ML suspension 2 mg, 2 mg, Per Tube, Daily PRN, Clarnce Flock, MD   metoCLOPramide (REGLAN) injection 10 mg, 10 mg, Intravenous, Q6H PRN, Kc, Ramesh, MD, 10 mg at 01/19/22 2253   metoprolol tartrate (LOPRESSOR) injection 5 mg, 5 mg, Intravenous, Q4H PRN, Amin, Ankit Chirag, MD   ondansetron (ZOFRAN) tablet 4 mg, 4 mg, Oral, Q6H PRN **OR** ondansetron (ZOFRAN) injection 4 mg, 4 mg, Intravenous, Q6H PRN, Clarnce Flock, MD, 4 mg at 01/19/22 2035   Oral care mouth rinse, 15 mL, Mouth Rinse, 4 times per day, Sharen Hones, MD, 15 mL at 01/20/22 1008   Oral care mouth rinse, 15 mL, Mouth Rinse, PRN, Sharen Hones, MD   oxyCODONE (Oxy IR/ROXICODONE) immediate release tablet 5 mg, 5 mg, Oral, Q4H PRN, Clayton Lefort  M, MD   pantoprazole (PROTONIX) injection 40 mg, 40 mg, Intravenous, Daily, Beers, Shanon Brow, RPH, 40 mg at 01/20/22 1004   polyethylene glycol (MIRALAX / GLYCOLAX) packet 17 g, 17 g, Oral, Daily PRN, Clarnce Flock, MD   scopolamine (TRANSDERM-SCOP) 1 MG/3DAYS 1.5 mg, 1 patch, Transdermal, Q72H, Clarnce Flock, MD, 1.5 mg at 01/18/22 0859   sodium chloride flush (NS) 0.9 % injection 3 mL, 3 mL, Intravenous, Q12H, Clarnce Flock, MD, 3 mL at 01/20/22 1011   traZODone (DESYREL) tablet 50 mg, 50 mg, Oral, QHS PRN, Clarnce Flock, MD   valproic acid (DEPAKENE) 250 MG/5ML solution 750 mg, 750 mg, Per Tube, TID, Clarnce Flock, MD, 750 mg at 01/20/22 1008    ALLERGIES   Zestril [lisinopril]     REVIEW OF SYSTEMS    Review of Systems:  Gen:  Denies  fever, sweats, chills weigh loss  HEENT: Denies blurred vision, double vision, ear pain, eye pain, hearing loss, nose bleeds, sore throat Cardiac:  No dizziness, chest pain or heaviness, chest tightness,edema Resp:   reports dyspnea chronically  Gi: Denies  swallowing difficulty, stomach pain, nausea or vomiting, diarrhea, constipation, bowel incontinence Gu:  Denies bladder incontinence, burning urine Ext:   Denies Joint pain, stiffness or swelling Skin: Denies  skin rash, easy bruising or bleeding or hives Endoc:  Denies polyuria, polydipsia , polyphagia or weight change Psych:   Denies depression, insomnia or hallucinations   Other:  All other systems negative   VS: BP (!) 158/92 (BP Location: Right Arm)   Pulse (!) 105   Temp 98 F (36.7 C) (Oral)   Resp 20   Ht 6' (1.829 m)   Wt 94.7 kg   SpO2 99%   BMI 28.32 kg/m      PHYSICAL EXAM    GENERAL:NAD, no fevers, chills, no weakness no fatigue HEAD: Normocephalic, atraumatic.  EYES: Pupils equal, round, reactive to light. Extraocular muscles intact. No scleral icterus.  MOUTH: Moist mucosal membrane. Dentition intact. No abscess noted.  EAR, NOSE, THROAT: Clear without exudates. No external lesions. , +trache NECK: Supple. No thyromegaly. No nodules. No JVD.  PULMONARY: decreased breath sounds with mild rhonchi worse at bases bilaterally.  CARDIOVASCULAR: S1 and S2. Regular rate and rhythm. No murmurs, rubs, or gallops. No edema. Pedal pulses 2+ bilaterally.  GASTROINTESTINAL: Soft, nontender, nondistended. No masses. Positive bowel sounds. No hepatosplenomegaly.  MUSCULOSKELETAL: No swelling, clubbing, or edema. Range of motion full in all extremities.  NEUROLOGIC: Cranial nerves II through XII are intact. No gross focal neurological deficits. Sensation intact. Reflexes intact.  SKIN: No ulceration, lesions, rashes, or cyanosis. Skin warm and dry. Turgor intact.  PSYCHIATRIC: Mood, affect within normal limits. The patient is awake, alert and oriented x 3. Insight, judgment intact.       IMAGING     ASSESSMENT/PLAN   Acute on chronic hypoxemic respiratory failure -  due to possible tracheitis - Aspirate + for pseudomonas - have narrowed regimen to cipro per sensitivity   -he has been on agumentin for aspiration and completed therapy - his blood cultures have + staph epidermitis likely contaminant -his TTE shows severe systolic CHF with EF 16-01% -patient on scopolamine, oxycodone and rubinol - s/p diuresis with negative 3L fluid balance now -s/p bronchoscopy with edematous and erythematous mucosa and bilateral mucus plugging.  BAL and therapeutic aspiration of tracheobroncheal tree was performed     Thank you for allowing me to participate in the care of  this patient.   Patient/Family are satisfied with care plan and all questions have been answered.    Provider disclosure: Patient with at least one acute or chronic illness or injury that poses a threat to life or bodily function and is being managed actively during this encounter.  All of the below services have been performed independently by signing provider:  review of prior documentation from internal and or external health records.  Review of previous and current lab results.  Interview and comprehensive assessment during patient visit today. Review of current and previous chest radiographs/CT scans. Discussion of management and test interpretation with health care team and patient/family.   This document was prepared using Dragon voice recognition software and may include unintentional dictation errors.     Ottie Glazier, M.D.  Division of Pulmonary & Critical Care Medicine

## 2022-01-20 NOTE — TOC Progression Note (Signed)
Transition of Care [facility]) - Progression Note    Patient Details  Name: Billy Cherry MRN: [id number] Date of Birth: 04-05-1970  Transition of Care [facility]) CM/SW Contact  Darolyn Rua, [place] Phone Number: 01/20/2022, 9:22 AM  Clinical Narrative:      [facility] Lade in dc continues to be patient's private duty nursing agency [facility] having staffing issues. They report they have coordinated with [facility] private duty that can come out Tuesday at earliest, at this time [facility] does not have weekend staffing and anticipate dc Monday. CSW has reached out to [place] with [facility] who confirms no changes at this time in identifying weekend staffing. Wife does note feel comfortable taking patient home without private duty nursing staff scheduled upon his return home.    Expected Discharge Plan: Home w Home Health Services (Private duty nursing) Barriers to Discharge: Continued Medical Work up  Expected Discharge Plan and Services Expected Discharge Plan: Home w Home Health Services (Private duty nursing)     Post Acute Care Choice: Resumption of Svcs/PTA Provider Living arrangements for the past 2 months: Single Family Home Expected Discharge Date: 01/19/22                                     Social Determinants of Health (SDOH) Interventions    Readmission Risk Interventions    01/08/2022    3:18 PM 04/14/2021   11:14 AM  Readmission Risk Prevention Plan  Transportation Screening Complete Complete  PCP or Specialist Appt within 3-5 Days Complete   HRI or Home Care Consult Complete Complete  Social Work Consult for Recovery Care Planning/Counseling Complete Complete  Palliative Care Screening Not Applicable Complete  Medication Review [employer]) Complete Complete

## 2022-01-21 LAB — GLUCOSE, CAPILLARY
Glucose-Capillary: 100 mg/dL — ABNORMAL HIGH (ref 70–99)
Glucose-Capillary: 113 mg/dL — ABNORMAL HIGH (ref 70–99)
Glucose-Capillary: 121 mg/dL — ABNORMAL HIGH (ref 70–99)
Glucose-Capillary: 157 mg/dL — ABNORMAL HIGH (ref 70–99)
Glucose-Capillary: 88 mg/dL (ref 70–99)
Glucose-Capillary: 89 mg/dL (ref 70–99)
Glucose-Capillary: 99 mg/dL (ref 70–99)

## 2022-01-21 NOTE — Progress Notes (Deleted)
Wrong chart

## 2022-01-21 NOTE — Plan of Care (Signed)

## 2022-01-21 NOTE — Progress Notes (Signed)
PULMONOLOGY         Date: 01/21/2022,   MRN# [id number] Billy Cherry 20-Sep-1970     AdmissionWeight: 108.9 kg                 CurrentWeight: 94.3 kg  Referring provider: Gorman:   Acute on chronic hypoxemic respiratory failure    HISTORY OF PRESENT ILLNESS   Billy Cherry is a 51 y.o. male with medical history significant for anoxic brain injury and persistent vegetative state status post tracheostomy and PEG tube placement, chronic systolic CHF, hypertension, DVT on chronic anticoagulation, who presents with fever and tachycardia.Patient was brought in from home due to fever, tachycardia, and thick secretions from his tracheostomy tube.  No additional history given. Patient was febrile on arrival with tachypnea and tacchycardia and hypoxemia.  CBC notable for white count of 17, mild anemia with hemoglobin 12.2, CMP was unremarkable.  Lactic acid was elevated at 2.5, chest x-ray showed low lung volumes and no obvious infiltrates.  Blood and urine cultures were obtained.  Respiratory viral panel was obtained.  He was given a dose of azithromycin, cefepime, vancomycin, 1 L NS bolus, and admitted for further management. He had CT chest abd and pelvis in June 2023 with findings of pulmonary interstitial edema bilaterally at moderate severity, he had CXR on arrival with findings of poor inspiratory effort, mild pulmonary edema.   01/13/22- patient with persistent high volume mucopuruent secretions despite antibiotics.  Will perform airway inspection and bronchial washings for microbiology today.  01/14/22 - patient doing well on 5L/min seems to be coughing less with less secretions.  Reviewed medical plan with Dr Reesa Chew today.  01/17/22- patient seen at bedside, non tachypneic on 5L/min. Trache collar, , reviewed treatment for pseudomonas + tracheitis, therapy changed per sensitivity panel to Cipro BID.  This may just be colonization but difficult to tell since  patient is non verbal and unable to relate symptomatology, he does not have leukocytosis, fever, tachycardia.   01/18/22 -patient seen at bedside, trache secrections are unchanged from yesterday. He is on cipro BID.  He is stable non labored breathing , no accessory muscle use, non tachypneic , at 5L/min.  01/19/22-  patient has been diuresed and is less ronchorous, negative 4L on fluid balance.  He is on cipro and may continue this for 5 more days to help with pseudomonas tracheitis although still were not completely sure if this is a chronic colonizer which may be incidentally found.   01/20/22 - patient is resting in bed comfortably.  He is asleep nonlabored breathing during my evaluation. On ausucultation lungs sound less rhonchorous , patient on trache collar 5L/min  01/21/22- No changes overnight, patient stable, PCCM will sign off please call/page if any questions/concerns arise.  Thank you for allowing [place] to care for this unfortunate patient.    PAST MEDICAL HISTORY   Past Medical History:  Diagnosis Date   Hypertension      SURGICAL HISTORY   Past Surgical History:  Procedure Laterality Date   BACK SURGERY     CORONARY/GRAFT ACUTE MI REVASCULARIZATION N/A 03/23/2021   Procedure: Coronary/Graft Acute MI Revascularization;  Surgeon: Isaias Cowman, MD;  Location: [facility];  Service: Cardiovascular;  Laterality: N/A;   IR GASTROSTOMY TUBE MOD SED  04/26/2021   LEFT HEART CATH AND CORONARY ANGIOGRAPHY N/A 03/23/2021   Procedure: LEFT HEART CATH AND CORONARY ANGIOGRAPHY;  Surgeon: Isaias Cowman, MD;  Location: [facility];  Service: Cardiovascular;  Laterality: N/A;   SHOULDER SURGERY       FAMILY HISTORY   No family history on file.   SOCIAL HISTORY   Social History   Tobacco Use   Smoking status: Never   Smokeless tobacco: Never  Vaping Use   Vaping Use: Unknown  Substance Use Topics   Alcohol use: Yes   Drug use: Never      MEDICATIONS    Home Medication:    Current Medication:  Current Facility-Administered Medications:    acetaminophen (TYLENOL) tablet 650 mg, 650 mg, Oral, Q6H PRN **OR** acetaminophen (TYLENOL) suppository 650 mg, 650 mg, Rectal, Q6H PRN, Clarnce Flock, MD   apixaban Arne Cleveland) tablet 5 mg, 5 mg, Oral, BID, Sreenath, Sudheer B, MD, 5 mg at 01/21/22 1032   atropine 1 % ophthalmic solution 2 drop, 2 drop, Sublingual, QID, Clarnce Flock, MD, 2 drop at 01/20/22 2201   carvedilol (COREG) tablet 3.125 mg, 3.125 mg, Per Tube, BID WC, Lanney Gins, Irl Bodie, MD, 3.125 mg at 01/21/22 1033   Chlorhexidine Gluconate Cloth 2 % PADS 6 each, 6 each, Topical, Daily, Sharen Hones, MD, 6 each at 01/20/22 1009   ciprofloxacin (CIPRO) tablet 250 mg, 250 mg, Oral, BID, Ottie Glazier, MD, 250 mg at 01/21/22 0536   clonazePAM (KLONOPIN) tablet 0.5 mg, 0.5 mg, Per Tube, BID, Clarnce Flock, MD, 0.5 mg at 01/21/22 1032   famotidine (PEPCID) tablet 20 mg, 20 mg, Per Tube, Daily, Beers, Shanon Brow, RPH, 20 mg at 01/21/22 1033   feeding supplement (OSMOLITE 1.5 CAL) liquid 355 mL, 355 mL, Per Tube, QID, Sharen Hones, MD, 355 mL at 01/21/22 0830   feeding supplement (PROSource TF20) liquid 60 mL, 60 mL, Per Tube, Daily, Sharen Hones, MD, 60 mL at 01/20/22 1433   fiber (NUTRISOURCE FIBER) 1 packet, 1 packet, Per Tube, BID, Clarnce Flock, MD, 1 packet at 01/21/22 1032   free water 200 mL, 200 mL, Per Tube, Q6H, Sharen Hones, MD, 200 mL at 01/21/22 0830   glucagon (human recombinant) (GLUCAGEN) injection 1 mg, 1 mg, Intravenous, Once PRN, Amin, Ankit Chirag, MD   glycopyrrolate (ROBINUL) tablet 1 mg, 1 mg, Per Tube, TID, Clarnce Flock, MD, 1 mg at 01/21/22 1031   guaiFENesin (ROBITUSSIN) 100 MG/5ML liquid 5 mL, 5 mL, Oral, Q4H PRN, Amin, Ankit Chirag, MD   hydrALAZINE (APRESOLINE) injection 10 mg, 10 mg, Intravenous, Q4H PRN, Amin, Ankit Chirag, MD   hydrALAZINE (APRESOLINE) tablet 50 mg, 50 mg,  Per Tube, TID, Clarnce Flock, MD, 50 mg at 01/21/22 1032   ipratropium-albuterol (DUONEB) 0.5-2.5 (3) MG/3ML nebulizer solution 3 mL, 3 mL, Nebulization, Q4H PRN, Clarnce Flock, MD   levETIRAcetam (KEPPRA) 100 MG/ML solution 750 mg, 750 mg, Per Tube, BID, Clarnce Flock, MD, 750 mg at 01/21/22 1043   loperamide HCl (IMODIUM) 1 MG/7.5ML suspension 2 mg, 2 mg, Per Tube, Daily PRN, Clarnce Flock, MD   metoCLOPramide (REGLAN) injection 10 mg, 10 mg, Intravenous, Q6H PRN, Kc, Ramesh, MD, 10 mg at 01/19/22 2253   metoprolol tartrate (LOPRESSOR) injection 5 mg, 5 mg, Intravenous, Q4H PRN, Amin, Ankit Chirag, MD   ondansetron (ZOFRAN) tablet 4 mg, 4 mg, Oral, Q6H PRN **OR** ondansetron (ZOFRAN) injection 4 mg, 4 mg, Intravenous, Q6H PRN, Clarnce Flock, MD, 4 mg at 01/19/22 2035   Oral care mouth rinse, 15 mL, Mouth Rinse, 4 times per day, Sharen Hones, MD, 15 mL at 01/21/22 1043  Oral care mouth rinse, 15 mL, Mouth Rinse, PRN, Sharen Hones, MD   oxyCODONE (Oxy IR/ROXICODONE) immediate release tablet 5 mg, 5 mg, Oral, Q4H PRN, Clarnce Flock, MD   pantoprazole (PROTONIX) injection 40 mg, 40 mg, Intravenous, Daily, Lorna Dibble, RPH, 40 mg at 01/21/22 1033   polyethylene glycol (MIRALAX / GLYCOLAX) packet 17 g, 17 g, Oral, Daily PRN, Clarnce Flock, MD   scopolamine (TRANSDERM-SCOP) 1 MG/3DAYS 1.5 mg, 1 patch, Transdermal, Q72H, Clarnce Flock, MD, 1.5 mg at 01/18/22 0859   sodium chloride flush (NS) 0.9 % injection 3 mL, 3 mL, Intravenous, Q12H, Clarnce Flock, MD, 3 mL at 01/20/22 2202   traZODone (DESYREL) tablet 50 mg, 50 mg, Oral, QHS PRN, Clarnce Flock, MD   valproic acid (DEPAKENE) 250 MG/5ML solution 750 mg, 750 mg, Per Tube, TID, Clarnce Flock, MD, 750 mg at 01/21/22 1031    ALLERGIES   Zestril [lisinopril]     REVIEW OF SYSTEMS    Review of Systems:  Gen:  Denies  fever, sweats, chills weigh loss  HEENT: Denies blurred vision,  double vision, ear pain, eye pain, hearing loss, nose bleeds, sore throat Cardiac:  No dizziness, chest pain or heaviness, chest tightness,edema Resp:   reports dyspnea chronically  Gi: Denies swallowing difficulty, stomach pain, nausea or vomiting, diarrhea, constipation, bowel incontinence Gu:  Denies bladder incontinence, burning urine Ext:   Denies Joint pain, stiffness or swelling Skin: Denies  skin rash, easy bruising or bleeding or hives Endoc:  Denies polyuria, polydipsia , polyphagia or weight change Psych:   Denies depression, insomnia or hallucinations   Other:  All other systems negative   VS: BP (!) 128/96 (BP Location: Right Arm)   Pulse 94   Temp 98.6 F (37 C)   Resp 19   Ht 6' (1.829 m)   Wt 94.3 kg   SpO2 100%   BMI 28.20 kg/m      PHYSICAL EXAM    GENERAL:NAD, no fevers, chills, no weakness no fatigue HEAD: Normocephalic, atraumatic.  EYES: Pupils equal, round, reactive to light. Extraocular muscles intact. No scleral icterus.  MOUTH: Moist mucosal membrane. Dentition intact. No abscess noted.  EAR, NOSE, THROAT: Clear without exudates. No external lesions. , +trache NECK: Supple. No thyromegaly. No nodules. No JVD.  PULMONARY: decreased breath sounds with mild rhonchi worse at bases bilaterally.  CARDIOVASCULAR: S1 and S2. Regular rate and rhythm. No murmurs, rubs, or gallops. No edema. Pedal pulses 2+ bilaterally.  GASTROINTESTINAL: Soft, nontender, nondistended. No masses. Positive bowel sounds. No hepatosplenomegaly.  MUSCULOSKELETAL: No swelling, clubbing, or edema. Range of motion full in all extremities.  NEUROLOGIC: Cranial nerves II through XII are intact. No gross focal neurological deficits. Sensation intact. Reflexes intact.  SKIN: No ulceration, lesions, rashes, or cyanosis. Skin warm and dry. Turgor intact.  PSYCHIATRIC: Mood, affect within normal limits. The patient is awake, alert and oriented x 3. Insight, judgment intact.        IMAGING     ASSESSMENT/PLAN   Acute on chronic hypoxemic respiratory failure -  due to possible tracheitis - Aspirate + for pseudomonas - have narrowed regimen to cipro per sensitivity  -he has been on agumentin for aspiration and completed therapy - his blood cultures have + staph epidermitis likely contaminant -his TTE shows severe systolic CHF with EF 56-43% -patient on scopolamine, oxycodone and rubinol - s/p diuresis with negative 3L fluid balance now -s/p bronchoscopy with edematous and erythematous mucosa and bilateral  mucus plugging.  BAL and therapeutic aspiration of tracheobroncheal tree was performed     Thank you for allowing me to participate in the care of this patient.   Patient/Family are satisfied with care plan and all questions have been answered.    Provider disclosure: Patient with at least one acute or chronic illness or injury that poses a threat to life or bodily function and is being managed actively during this encounter.  All of the below services have been performed independently by signing provider:  review of prior documentation from internal and or external health records.  Review of previous and current lab results.  Interview and comprehensive assessment during patient visit today. Review of current and previous chest radiographs/CT scans. Discussion of management and test interpretation with health care team and patient/family.   This document was prepared using Dragon voice recognition software and may include unintentional dictation errors.     Ottie Glazier, M.D.  Division of Pulmonary & Critical Care Medicine

## 2022-01-21 NOTE — Progress Notes (Signed)
PROGRESS NOTE    Billy Cherry  XTK:[id number] DOB: August 15, 1970 DOA: 01/05/2022 PCP: [facility]    Brief Narrative:  51 y.o. male with medical history significant for anoxic brain injury and persistent vegetative state status post tracheostomy and PEG tube placement, chronic systolic CHF, hypertension, DVT on chronic anticoagulation, who presents with fever and tachycardia.  Patient had a thick secretion from trach tube, chest x-ray showed bilateral lower lobe atelectasis.  Patient also had a lactic acid level of 2.7. Admitted for sepsis likely from aspiration pneumonia placed on antibiotics cefepime, Flagyl.  Patient developed significant nausea vomiting on 11/4, KUB did while not see any acute changes.  Started on Reglan.  Patient wife also had GI symptoms, it appears that patient had gastroenteritis, causing nausea, subsequently caused aspiration pneumonia. Patient antibiotic was switched to Augmentin on 11/7.  Blood culture with Staph capitis in single single set likely contamination. Patient continued with full supportive care trach care respiratory care. Patient given few days of IV Lasix swelling has improved weight has returned to his baseline. Overall remains stable given his overall status and always at risk of aspiration/infection/pneumonia bedsores. Advise continued follow-up with his pulmonary team at [facility]. Pulm team consulted for increase in tracheal secretions. Patient had bronchoscopy on 11/11 showing b/l mucopurulent secretions with erythema.  Plan had been for patient to be discharged to home with home health on 11/17, however due to staffing issues, they are unable to provide support until 11/21.  Unable to staff on the weekend.  Patient's wife not comfortable taking care of him without additional support.   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  Bcx had grown Stap Capitis treated with Augmentin. Still concerns of infection, therefore tracheal secretions cultures ordered. Patient had bronchoscopy on 11/11 showing b/l mucopurulent secretions.  Tracheal aspirate growing GPC, GNR/Psuedomonas.  BAL cultures sent. Was on Zosyn.  BAL cultures show no result.  De-escalated to cipro.  If stable, anticipate dc tomorrow AM when home health has more staffing.   Pulmonary congestion Acute systolic CHF, ef 99% Initially was on diuretics to help with decongestion but not due to the signs of dehydration they are on hold.  Continue Coreg.   AKI; resolved.  Elevated Cr, 0.8 > 2.1 > 0.92. Will hold diuretics.    Mild Hyperkalemia Resolved   Acute gastroenteritis: Improved.  w/ loose BM.  Tolerating tube feeding no nausea vomiting.Continue bolus tube feeding.  At home on continues tube feeding, continue supportive care.  Dietitian following.   Borderline blood sugar level: Blood sugar at this time is stable, continue tube feeding.    Anoxic brain injury  and dependent on Trach/Peg, able to track but nonverbal. Continue trach collar and trach suctioning, tube feeding.  Continue home atropine eye drops, Pepcid, Keppra Depakene.   DVT/PE hx: cont  home Eliquis   Hypertension BP fairly stable cont  hydralazine, Coreg  DVT prophylaxis: SCDs, eliquyis Code Status: FULL Family Communication: We will call wife. Disposition Plan: Status is: Inpatient Remains inpatient appropriate because: Aspiration pnuemonia.  Doing clinically well.  Anticipate dc 11/20   Level of care: Telemetry Cardiac  Consultants:   Pulmonary  Procedures:  Bronchoscopy  Antimicrobials: Cipro   Subjective: No acute distress.  Objective: Vitals:   01/21/22 0500 01/21/22 0506 01/21/22 0822 01/21/22 1205  BP:  123/89 (!) 128/96 (!) 130/97  Pulse:  87 94 95  Resp:  19    Temp:  98.6 F (37 C) 98.6 F (37 C) 99.1 F (37.3 C)  TempSrc:  Oral    SpO2:  100% 100% 97%  Weight: 94.3 kg     Height:        Intake/Output Summary (Last 24 hours) at 01/21/2022 1600 Last data filed at 01/20/2022 2232 Gross per 24 hour  Intake 710 ml  Output 400 ml  Net 310 ml    Filed Weights   01/17/22 0519 01/18/22 0500 01/21/22 0500  Weight: 95.8 kg 94.7 kg 94.3 kg    Examination: Unchanged from previous day General exam: Appears chronically ill, no visible distress Respiratory system: Coarse breath sounds, trach mask 5L Cardiovascular system: S1S2, RRR, borderline tachycardia Gastrointestinal system: Soft ND, +Peg Central nervous system: Oriented x 0 Extremities: Symmetric 0 x 5 power. Skin: No rashes, lesions or ulcers Psychiatry: Unable to assess    Data Reviewed: I have personally reviewed following labs and imaging studies Hemoglobin about the same from previous day. CBC: Recent Labs  Lab 01/16/22 0658 01/17/22 0552 01/18/22 0536 01/19/22 0650 01/20/22 0521  WBC 6.3 5.9 7.8 7.6 7.4  HGB 12.1* 13.7 11.6* 12.1* 12.2*  HCT 37.7* 42.0 36.1* 37.2* 37.4*  MCV 97.2 97.7 98.9 97.1 96.6  PLT 258 141* 268 257 263    Basic Metabolic Panel: Recent Labs  Lab 01/16/22 0658 01/17/22 0552 01/18/22 0536 01/19/22 0650 01/20/22 0521  NA 138 140 138 138 137  K 4.8 5.7* 4.5 4.5 4.9  CL 99 102 98 96* 96*  CO2 32 27 31 34* 32  GLUCOSE 95 98 98 88 94  BUN 32* 31* 28* 33* 41*  CREATININE 0.92 0.84 0.83 0.93 1.19  CALCIUM 9.4 9.8 9.5 9.6 9.5  MG 2.1 2.2 2.0 2.1 2.3    GFR: Estimated Creatinine Clearance: 87.6 mL/min (by C-G formula based on SCr of 1.19 mg/dL). Liver Function Tests: No results for  input(s): "AST", "ALT", "ALKPHOS", "BILITOT", "PROT", "ALBUMIN" in the last 168 hours. No results for input(s): "LIPASE", "AMYLASE" in the last 168 hours. No results for input(s): "AMMONIA" in the last 168 hours. Coagulation Profile: No results for input(s): "INR", "PROTIME" in the last 168 hours. Cardiac Enzymes: No results for input(s): "CKTOTAL", "CKMB", "CKMBINDEX", "TROPONINI" in the last 168 hours. BNP (last 3 results) No results for input(s): "PROBNP" in the last 8760 hours. HbA1C: No results for input(s): "HGBA1C" in the last 72 hours. CBG: Recent Labs  Lab 01/20/22 2007 01/21/22 0048 01/21/22 0504 01/21/22 0823 01/21/22 1206  GLUCAP 77 121* 113* 100* 88    Lipid Profile: No results for input(s): "CHOL", "HDL", "LDLCALC", "TRIG", "CHOLHDL", "LDLDIRECT" in the last 72 hours. Thyroid Function Tests: No results for input(s): "TSH", "T4TOTAL", "FREET4", "T3FREE", "THYROIDAB" in the last 72 hours. Anemia Panel: No results for input(s): "VITAMINB12", "FOLATE", "FERRITIN", "TIBC", "IRON", "RETICCTPCT" in the last 72 hours. Sepsis Labs: No results for input(s): "PROCALCITON", "LATICACIDVEN" in the last 168 hours.  Recent Results (from the past 240 hour(s))  Culture, Respiratory w Gram Stain     Status: None   Collection Time: 01/12/22  4:15 PM   Specimen: Tracheal Aspirate; Respiratory  Result Value Ref Range Status   Specimen Description   Final    TRACHEAL ASPIRATE Performed at [facility], [address] [place], [place] [address]    Special Requests   Final    NONE Performed at [facility], [place], [place] [address]    Gram Stain   Final    RARE WBC PRESENT, PREDOMINANTLY MONONUCLEAR FEW GRAM POSITIVE COCCI IN PAIRS FEW GRAM NEGATIVE RODS Performed at [facility], [place] [address], [place], [place] [address]    Culture MODERATE PSEUDOMONAS AERUGINOSA  Final   Report Status 01/16/2022 FINAL  Final   Organism ID,  Bacteria PSEUDOMONAS AERUGINOSA  Final      Susceptibility   Pseudomonas aeruginosa - MIC*    CEFTAZIDIME <=1 SENSITIVE Sensitive     CIPROFLOXACIN <=0.25 SENSITIVE Sensitive     GENTAMICIN <=1 SENSITIVE Sensitive     IMIPENEM 2 SENSITIVE Sensitive     PIP/TAZO 16 SENSITIVE Sensitive     CEFEPIME 2 SENSITIVE Sensitive     * MODERATE PSEUDOMONAS AERUGINOSA  Acid Fast Smear (AFB)     Status: None   Collection Time: 01/13/22  3:29 PM   Specimen: Bronchial Washing, Left; Respiratory  Result Value Ref Range Status   AFB Specimen Processing Concentration  Final   Acid Fast Smear Negative  Final    Comment: (NOTE) Performed At: [facility] [address] [place], [place] [id number] Rush Farmer MD FB:[phone]    Source (AFB) SPU  Final    Comment: Performed at [facility], [address] [place], [place] [address]         Radiology Studies: No results found.      Scheduled Meds:  apixaban  5 mg Oral BID   atropine  2 drop Sublingual QID   carvedilol  3.125 mg Per Tube BID WC   Chlorhexidine Gluconate Cloth  6 each Topical Daily   ciprofloxacin  250 mg Oral BID   clonazePAM  0.5 mg Per Tube BID   famotidine  20 mg Per Tube Daily   feeding supplement (OSMOLITE 1.5 CAL)  355 mL Per Tube QID   feeding supplement (PROSource TF20)  60 mL Per Tube Daily   fiber  1 packet Per Tube BID   free water  200 mL Per Tube Q6H   glycopyrrolate  1 mg Per Tube TID   hydrALAZINE  50 mg Per Tube TID   levETIRAcetam  750 mg Per Tube BID   mouth rinse  15 mL Mouth Rinse 4 times per day   pantoprazole (PROTONIX) IV  40 mg Intravenous Daily   scopolamine  1 patch Transdermal Q72H   sodium chloride flush  3 mL Intravenous Q12H   valproic acid  750 mg Per Tube TID   Continuous Infusions:   LOS: 15 days      Annita Brod, MD [facility]   If 7PM-7AM, please contact night-coverage  01/21/2022, 4:00 PM

## 2022-01-22 LAB — GLUCOSE, CAPILLARY
Glucose-Capillary: 105 mg/dL — ABNORMAL HIGH (ref 70–99)
Glucose-Capillary: 112 mg/dL — ABNORMAL HIGH (ref 70–99)
Glucose-Capillary: 125 mg/dL — ABNORMAL HIGH (ref 70–99)
Glucose-Capillary: 162 mg/dL — ABNORMAL HIGH (ref 70–99)
Glucose-Capillary: 179 mg/dL — ABNORMAL HIGH (ref 70–99)
Glucose-Capillary: 90 mg/dL (ref 70–99)

## 2022-01-22 LAB — BASIC METABOLIC PANEL
Anion gap: 6 (ref 5–15)
BUN: 44 mg/dL — ABNORMAL HIGH (ref 6–20)
CO2: 36 mmol/L — ABNORMAL HIGH (ref 22–32)
Calcium: 9.6 mg/dL (ref 8.9–10.3)
Chloride: 99 mmol/L (ref 98–111)
Creatinine, Ser: 1.15 mg/dL (ref 0.61–1.24)
GFR, Estimated: >60 mL/min (ref >60)
Glucose, Bld: 90 mg/dL (ref 70–99)
Potassium: 4.2 mmol/L (ref 3.5–5.1)
Sodium: 141 mmol/L (ref 135–145)

## 2022-01-22 NOTE — Progress Notes (Signed)
PROGRESS NOTE    Billy Cherry  HWY:[id number] DOB: 1971-01-23 DOA: 01/05/2022 PCP: [facility]    Brief Narrative:  51 y.o. male with medical history significant for anoxic brain injury and persistent vegetative state status post tracheostomy and PEG tube placement, chronic systolic CHF, hypertension, DVT on chronic anticoagulation, who presents with fever and tachycardia.  Patient had a thick secretion from trach tube, chest x-ray showed bilateral lower lobe atelectasis.  Patient also had a lactic acid level of 2.7. Admitted for sepsis likely from aspiration pneumonia placed on antibiotics cefepime, Flagyl.  Patient developed significant nausea vomiting on 11/4, KUB did while not see any acute changes.  Started on Reglan.  Patient wife also had GI symptoms, it appears that patient had gastroenteritis, causing nausea, subsequently caused aspiration pneumonia. Patient antibiotic was switched to Augmentin on 11/7.  Blood culture with Staph capitis in single single set likely contamination. Patient continued with full supportive care trach care respiratory care. Patient given few days of IV Lasix swelling has improved weight has returned to his baseline. Overall remains stable given his overall status and always at risk of aspiration/infection/pneumonia bedsores. Advise continued follow-up with his pulmonary team at [facility]. Pulm team consulted for increase in tracheal secretions. Patient had bronchoscopy on 11/11 showing b/l mucopurulent secretions with erythema.  Plan had been for patient to be discharged to home with home health on 11/17, however due to staffing issues, they are unable to provide support until 11/22.  Unable to staff on the weekend.  Patient's wife not comfortable taking care of him without additional support.   Assessment & Plan:   Principal Problem:   Severe sepsis ([place]) Active Problems:   Anoxic brain injury ([place])   DVT (deep venous thrombosis) ([name])   HTN  (hypertension)   Chronic systolic CHF (congestive heart failure) ([name])   Tracheostomy dependent ([name])   S/P percutaneous endoscopic gastrostomy (PEG) tube placement ([name])   Aspiration pneumonia of both lower lobes ([name])   Gastroenteritis  Severe sepsis due to aspiration pneumonia Aspiration pneumonia Trachitis with increase tracheal secretions.  Bcx had grown Stap Capitis treated with Augmentin. Still concerns of infection, therefore tracheal secretions cultures ordered. Patient had bronchoscopy on 11/11 showing b/l mucopurulent secretions.  Tracheal aspirate growing GPC, GNR/Psuedomonas.  BAL cultures sent. Was on Zosyn.  BAL cultures show no result.  De-escalated to cipro.  If stable, anticipate dc tomorrow hopefully when home health has more staffing.   Pulmonary congestion Acute systolic CHF, ef 21% Initially was on diuretics to help with decongestion but not due to the signs of dehydration they are on hold.  Continue Coreg.   AKI; resolved.  Elevated Cr, 0.8 > 2.1 > 0.92. Will hold diuretics.  Creatinine this morning at 1.15.   Mild Hyperkalemia Resolved   Acute gastroenteritis: Improved.  w/ loose BM.  Tolerating tube feeding no nausea vomiting.Continue bolus tube feeding.  At home on continues tube feeding, continue supportive care.  Dietitian following.   Borderline blood sugar level: Blood sugars remain stable, continue tube feeding.    Anoxic brain injury  and dependent on Trach/Peg, able to track but nonverbal. Continue trach collar and trach suctioning, tube feeding.  Continue home atropine eye drops, Pepcid, Keppra Depakene.   DVT/PE hx: cont  home Eliquis   Hypertension BP fairly stable cont  hydralazine, Coreg  DVT prophylaxis: SCDs, eliquyis Code Status: FULL Family Communication: We will call wife. Disposition Plan: Status is: Inpatient Remains inpatient appropriate because: Aspiration pnuemonia.  Doing  clinically well. Anticipate dc 11/21   Level of care:  Telemetry Cardiac  Consultants:  Pulmonary  Procedures:  Bronchoscopy  Antimicrobials: Cipro   Subjective: About the same  Objective: Vitals:   01/22/22 0500 01/22/22 0753 01/22/22 1005 01/22/22 1142  BP:  (!) 142/111 (!) 130/99 (!) 137/108  Pulse:  79 90 88  Resp:  20  20  Temp:  97.9 F (36.6 C)  97.8 F (36.6 C)  TempSrc:  Oral  Oral  SpO2:  100% 100% 100%  Weight: 91.7 kg     Height:        Intake/Output Summary (Last 24 hours) at 01/22/2022 1424 Last data filed at 01/22/2022 2440 Gross per 24 hour  Intake 658 ml  Output 600 ml  Net 58 ml    Filed Weights   01/18/22 0500 01/21/22 0500 01/22/22 0500  Weight: 94.7 kg 94.3 kg 91.7 kg    Examination: No change from previous day General exam: Appears chronically ill, no visible distress Respiratory system: Coarse breath sounds, trach mask 5L Cardiovascular system: S1S2, RRR, borderline tachycardia Gastrointestinal system: Soft ND, +Peg Central nervous system: Oriented x 0 Extremities: Symmetric 0 x 5 power. Skin: No rashes, lesions or ulcers Psychiatry: Unable to assess    Data Reviewed: I have personally reviewed following labs and imaging studies Hemoglobin about the same from previous day. CBC: Recent Labs  Lab 01/16/22 0658 01/17/22 0552 01/18/22 0536 01/19/22 0650 01/20/22 0521  WBC 6.3 5.9 7.8 7.6 7.4  HGB 12.1* 13.7 11.6* 12.1* 12.2*  HCT 37.7* 42.0 36.1* 37.2* 37.4*  MCV 97.2 97.7 98.9 97.1 96.6  PLT 258 141* 268 257 102    Basic Metabolic Panel: Recent Labs  Lab 01/16/22 0658 01/17/22 0552 01/18/22 0536 01/19/22 0650 01/20/22 0521 01/22/22 0555  NA 138 140 138 138 137 141  K 4.8 5.7* 4.5 4.5 4.9 4.2  CL 99 102 98 96* 96* 99  CO2 32 27 31 34* 32 36*  GLUCOSE 95 98 98 88 94 90  BUN 32* 31* 28* 33* 41* 44*  CREATININE 0.92 0.84 0.83 0.93 1.19 1.15  CALCIUM 9.4 9.8 9.5 9.6 9.5 9.6  MG 2.1 2.2 2.0 2.1 2.3  --     GFR: Estimated Creatinine Clearance: 83.4 mL/min (by C-G  formula based on SCr of 1.15 mg/dL). Liver Function Tests: No results for input(s): "AST", "ALT", "ALKPHOS", "BILITOT", "PROT", "ALBUMIN" in the last 168 hours. No results for input(s): "LIPASE", "AMYLASE" in the last 168 hours. No results for input(s): "AMMONIA" in the last 168 hours. Coagulation Profile: No results for input(s): "INR", "PROTIME" in the last 168 hours. Cardiac Enzymes: No results for input(s): "CKTOTAL", "CKMB", "CKMBINDEX", "TROPONINI" in the last 168 hours. BNP (last 3 results) No results for input(s): "PROBNP" in the last 8760 hours. HbA1C: No results for input(s): "HGBA1C" in the last 72 hours. CBG: Recent Labs  Lab 01/21/22 2102 01/21/22 2341 01/22/22 0328 01/22/22 0748 01/22/22 1138  GLUCAP 99 157* 125* 90 112*    Lipid Profile: No results for input(s): "CHOL", "HDL", "LDLCALC", "TRIG", "CHOLHDL", "LDLDIRECT" in the last 72 hours. Thyroid Function Tests: No results for input(s): "TSH", "T4TOTAL", "FREET4", "T3FREE", "THYROIDAB" in the last 72 hours. Anemia Panel: No results for input(s): "VITAMINB12", "FOLATE", "FERRITIN", "TIBC", "IRON", "RETICCTPCT" in the last 72 hours. Sepsis Labs: No results for input(s): "PROCALCITON", "LATICACIDVEN" in the last 168 hours.  Recent Results (from the past 240 hour(s))  Culture, Respiratory w Gram Stain     Status: None  Collection Time: 01/12/22  4:15 PM   Specimen: Tracheal Aspirate; Respiratory  Result Value Ref Range Status   Specimen Description   Final    TRACHEAL ASPIRATE Performed at [facility], [address] [place], [place] [address]    Special Requests   Final    NONE Performed at [facility], [place], [place] [address]    Gram Stain   Final    RARE WBC PRESENT, PREDOMINANTLY MONONUCLEAR FEW GRAM POSITIVE COCCI IN PAIRS FEW GRAM NEGATIVE RODS Performed at [facility], [address], [place], [place] [address]    Culture MODERATE PSEUDOMONAS  AERUGINOSA  Final   Report Status 01/16/2022 FINAL  Final   Organism ID, Bacteria PSEUDOMONAS AERUGINOSA  Final      Susceptibility   Pseudomonas aeruginosa - MIC*    CEFTAZIDIME <=1 SENSITIVE Sensitive     CIPROFLOXACIN <=0.25 SENSITIVE Sensitive     GENTAMICIN <=1 SENSITIVE Sensitive     IMIPENEM 2 SENSITIVE Sensitive     PIP/TAZO 16 SENSITIVE Sensitive     CEFEPIME 2 SENSITIVE Sensitive     * MODERATE PSEUDOMONAS AERUGINOSA  Acid Fast Smear (AFB)     Status: None   Collection Time: 01/13/22  3:29 PM   Specimen: Bronchial Washing, Left; Respiratory  Result Value Ref Range Status   AFB Specimen Processing Concentration  Final   Acid Fast Smear Negative  Final    Comment: (NOTE) Performed At: [facility] [address] [place], [place] [id number] Rush Farmer MD AS:[id number]    Source (AFB) SPU  Final    Comment: Performed at [facility], [address] [place], [place] [address]         Radiology Studies: No results found.      Scheduled Meds:  apixaban  5 mg Oral BID   atropine  2 drop Sublingual QID   carvedilol  3.125 mg Per Tube BID WC   Chlorhexidine Gluconate Cloth  6 each Topical Daily   ciprofloxacin  250 mg Oral BID   clonazePAM  0.5 mg Per Tube BID   famotidine  20 mg Per Tube Daily   feeding supplement (OSMOLITE 1.5 CAL)  355 mL Per Tube QID   feeding supplement (PROSource TF20)  60 mL Per Tube Daily   fiber  1 packet Per Tube BID   free water  200 mL Per Tube Q6H   glycopyrrolate  1 mg Per Tube TID   hydrALAZINE  50 mg Per Tube TID   levETIRAcetam  750 mg Per Tube BID   mouth rinse  15 mL Mouth Rinse 4 times per day   pantoprazole (PROTONIX) IV  40 mg Intravenous Daily   scopolamine  1 patch Transdermal Q72H   sodium chloride flush  3 mL Intravenous Q12H   valproic acid  750 mg Per Tube TID   Continuous Infusions:   LOS: 16 days      Annita Brod, MD [facility]   If 7PM-7AM, please contact  night-coverage  01/22/2022, 2:24 PM

## 2022-01-22 NOTE — Plan of Care (Signed)
  Problem: Education: Goal: Knowledge of General Education information will improve Description: Including pain rating scale, medication(s)/side effects and non-pharmacologic comfort measures Outcome: Not Applicable   

## 2022-01-22 NOTE — TOC Progression Note (Addendum)
Transition of Care [facility]) - Progression Note    Patient Details  Name: Billy Cherry MRN: [id number] Date of Birth: May 17, 1970  [facility] CM/SW Contact  Margarito Liner, LCSW Phone Number: 01/22/2022, 12:36 PM  Clinical Narrative:   Belenda Cruise with [facility] will call wife regarding plan for discharge today and call back. Belenda Cruise will see if they have availability to resume services tonight or if they would have to start tomorrow. [facility] Duty Nursing will start tomorrow.  2:42 pm: Received voicemail from [place] [facility]. They will open services tomorrow but not start until Friday night. Called wife. She has not heard from [place] with [facility] today but will try calling her to determine if everything is ready for patient to discharge home today. CSW has tried calling Belenda Cruise another two times and left one voicemail.  4:01 pm: Received call from [place]. She stated their scheduler said wife was concerned about not having daytime coverage. Belenda Cruise asking about getting other services in the home vs LTC placement. She will call wife to discuss.  Expected Discharge Plan: Home w Home Health Services (Private duty nursing) Barriers to Discharge: Continued Medical Work up  Expected Discharge Plan and Services Expected Discharge Plan: Home w Home Health Services (Private duty nursing)     Post Acute Care Choice: Resumption of Svcs/PTA Provider Living arrangements for the past 2 months: Single Family Home Expected Discharge Date: 01/19/22                                     Social Determinants of Health (SDOH) Interventions    Readmission Risk Interventions    01/08/2022    3:18 PM 04/14/2021   11:14 AM  Readmission Risk Prevention Plan  Transportation Screening Complete Complete  PCP or Specialist Appt within 3-5 Days Complete   HRI or Home Care Consult Complete Complete  Social Work Consult for Recovery Care Planning/Counseling Complete Complete  Palliative Care Screening  Not Applicable Complete  Medication Review Oceanographer) Complete Complete

## 2022-01-23 DIAGNOSIS — I10 Essential (primary) hypertension: Secondary | ICD-10-CM

## 2022-01-23 LAB — GLUCOSE, CAPILLARY
Glucose-Capillary: 116 mg/dL — ABNORMAL HIGH (ref 70–99)
Glucose-Capillary: 74 mg/dL (ref 70–99)
Glucose-Capillary: 112 mg/dL — ABNORMAL HIGH (ref 70–99)
Glucose-Capillary: 89 mg/dL (ref 70–99)
Glucose-Capillary: 134 mg/dL — ABNORMAL HIGH (ref 70–99)

## 2022-01-23 NOTE — TOC Progression Note (Addendum)
Transition of Care [facility]) - Progression Note    Patient Details  Name: Billy Cherry MRN: [id number] Date of Birth: May 06, 1970  Transition of Care [facility]) CM/SW Contact  Darolyn Rua, [place] Phone Number: 01/23/2022, 10:19 AM  Clinical Narrative:     Update: [facility] unable to schedule 9:30 pm pick up, requested call back around that time. CSW has informed current RN to update night RN will need to call [facility] at 9:30 pm at [phone] for patient transport home to [address] Nicholes Rough with 5L O2 and trach.   Patient to dc home today with [facility] private duty nursing, CSW spoke with Baxter Hire at [phone] she confirms nursing coverage starting at 10 pm today. If she identifies earlier coverage she will let CSW know. She reports she and Frances Furbish are covering nights this week for staffing, daytime staffing is limited due ot holiday. Wife is aware of above.   CSW has also consulted with [facility] supervisor on case. Patient has been medically stable to dc since 11/17. Staffing barriers with private duty nursing have delayed dc.   Transport will be scheduled for 10pm pick up time to transport patient home. Wife, RN, MD and [facility] supervisor made aware.   DC summary and orders faxed to [facility] private duty nursing - Baxter Hire at [phone]   Expected Discharge Plan: Home w Home Health Services (Private duty nursing) Barriers to Discharge: Continued Medical Work up  Expected Discharge Plan and Services Expected Discharge Plan: Home w Home Health Services (Private duty nursing)     Post Acute Care Choice: Resumption of Svcs/PTA Provider Living arrangements for the past 2 months: Single Family Home Expected Discharge Date: 01/19/22                                     Social Determinants of Health (SDOH) Interventions    Readmission Risk Interventions    01/08/2022    3:18 PM 04/14/2021   11:14 AM  Readmission Risk Prevention Plan  Transportation Screening Complete Complete  PCP or  Specialist Appt within 3-5 Days Complete   HRI or Home Care Consult Complete Complete  Social Work Consult for Recovery Care Planning/Counseling Complete Complete  Palliative Care Screening Not Applicable Complete  Medication Review Oceanographer) Complete Complete

## 2022-01-23 NOTE — Plan of Care (Signed)
  Problem: Clinical Measurements: Goal: Respiratory complications will improve Outcome: Progressing   Problem: Clinical Measurements: Goal: Cardiovascular complication will be avoided Outcome: Progressing   Problem: Nutrition: Goal: Adequate nutrition will be maintained Outcome: Progressing   Problem: Pain Managment: Goal: General experience of comfort will improve Outcome: Progressing   Problem: Safety: Goal: Ability to remain free from injury will improve Outcome: Progressing   

## 2022-01-23 NOTE — Discharge Summary (Signed)
Physician Discharge Summary  Billy Cherry PPJ:[id number] DOB: 1970-12-06 DOA: 01/05/2022  PCP: [facility] date: 01/05/2022 Discharge date: 01/23/2022  Admitted From: Home Disposition:  Home.  Resumption of private duty nursing on discharge  Recommendations for Outpatient Follow-up:  Follow up with PCP in 1-2 weeks Home health to resume  Home Health:Resumption of private duty nursing on discharge Equipment/Devices:Trach mask, oxygen via mask, PEG tube  Discharge Condition:Stable CODE STATUS:FULL  Diet recommendation: Tube feeds only  Brief/Interim Summary: 51 y.o. male with medical history significant for anoxic brain injury and persistent vegetative state status post tracheostomy and PEG tube placement, chronic systolic CHF, hypertension, DVT on chronic anticoagulation, who presents with fever and tachycardia.  Patient had a thick secretion from trach tube, chest x-ray showed bilateral lower lobe atelectasis.  Patient also had a lactic acid level of 2.7. Admitted for sepsis likely from aspiration pneumonia placed on antibiotics cefepime, Flagyl.  Patient developed significant nausea vomiting on 11/4, KUB did while not see any acute changes.  Started on Reglan.  Patient wife also had GI symptoms, it appears that patient had gastroenteritis, causing nausea, subsequently caused aspiration pneumonia. Patient antibiotic was switched to Augmentin on 11/7.  Blood culture with Staph capitis in single single set likely contamination. Patient continued with full supportive care trach care respiratory care. Patient given few days of IV Lasix swelling has improved weight has returned to his baseline. Overall remains stable given his overall status and always at risk of aspiration/infection/pneumonia bedsores. Advise continued follow-up with his pulmonary team at [facility]. Pulm team consulted for increase in tracheal secretions. Patient had bronchoscopy on 11/11 showing b/l mucopurulent  secretions with erythema.    Patient remained stable post bronchscopy. Continued to require attentive and frequent nursing care.  Stable, with normal vitals and labs at time of discharge.     Discharge Diagnoses:  Principal Problem:   Severe sepsis ([place]) Active Problems:   Anoxic brain injury ([place])   DVT (deep venous thrombosis) ([name])   HTN (hypertension)   Chronic systolic CHF (congestive heart failure) ([name])   Tracheostomy dependent ([name])   S/P percutaneous endoscopic gastrostomy (PEG) tube placement ([name])   Aspiration pneumonia of both lower lobes ([name])   Gastroenteritis  Severe sepsis due to aspiration pneumonia Aspiration pneumonia Trachitis with increase tracheal secretions.  Bcx had grown Stap Capitis treated with Augmentin. Still concerns of infection, therefore tracheal secretions cultures ordered. Patient had bronchoscopy on 11/11 showing b/l mucopurulent secretions.  Tracheal aspirate growing GPC, GNR/Psuedomonas.  BAL cultures sent. Was on Zosyn.  BAL cultures show no result.  De-escalated to cipro.  Stable on day of dc. Will discharge on cipro via tube x 7 days   Pulmonary congestion Acute systolic CHF, ef 58% Initially was on diuretics to help with decongestion but not due to the signs of dehydration they are on hold.  Continue Coreg.  Dose reduced on discharge to 6.25 BID   AKI; resolved.  Will hold diuretics. Diuretics held on DC & Crt on d/c 1.15   Mild Hyperkalemia Resolved   Acute gastroenteritis: Improved.  w/ loose BM.  Tolerating tube feeding no nausea vomiting.Continue bolus tube feeding.  At home on continues tube feeding, continue supportive care.  Dietitian following.   Borderline blood sugar level: Blood sugar at this time is stable, continue tube feeding.    Anoxic brain injury  and dependent on Trach/Peg, able to track but nonverbal. Continue trach collar and trach suctioning, tube feeding.  Continue home atropine  eye drops, Pepcid, Keppra  Depakene.   DVT/PE hx: cont  home Eliquis   Hypertension BP fairly stable cont  hydralazine, Coreg  Discharge Instructions  Discharge Instructions     Increase activity slowly   Complete by: As directed       Allergies as of 01/23/2022       Reactions   Zestril [lisinopril] Swelling   angioedema        Medication List     TAKE these medications    acetaminophen 325 MG tablet Commonly known as: TYLENOL Place 2 tablets (650 mg total) into feeding tube every 6 (six) hours as needed for mild pain or fever.   albuterol (2.5 MG/3ML) 0.083% nebulizer solution Commonly known as: PROVENTIL Take 3 mLs (2.5 mg total) by nebulization every 6 (six) hours as needed for wheezing or shortness of breath.   apixaban 5 MG Tabs tablet Commonly known as: ELIQUIS Place 1 tablet (5 mg total) into feeding tube 2 (two) times daily.   atropine 1 % ophthalmic solution Place 2 drops under the tongue 4 (four) times daily.   carvedilol 6.25 MG tablet Commonly known as: COREG Place 1 tablet (6.25 mg total) into feeding tube 2 (two) times daily with a meal. What changed:  medication strength how much to take   chlorhexidine gluconate (MEDLINE KIT) 0.12 % solution Commonly known as: PERIDEX 15 mLs by Mouth Rinse route 2 (two) times daily.   ciprofloxacin 250 MG tablet Commonly known as: CIPRO Place 1 tablet (250 mg total) into feeding tube 2 (two) times daily for 7 days.   clonazePAM 0.5 MG tablet Commonly known as: KLONOPIN Place 1 tablet (0.5 mg total) into feeding tube 2 (two) times daily.   doxazosin 2 MG tablet Commonly known as: CARDURA Place 1 tablet (2 mg total) into feeding tube daily.   famotidine 20 MG tablet Commonly known as: PEPCID Place 1 tablet (20 mg total) into feeding tube 2 (two) times daily.   feeding supplement (OSMOLITE 1.5 CAL) Liqd Place 1,000 mLs into feeding tube continuous.   feeding supplement (PROSource TF) liquid Place 45 mLs into feeding tube  3 (three) times daily.   fiber Pack packet Place 1 packet into feeding tube 2 (two) times daily.   fluticasone 50 MCG/ACT nasal spray Commonly known as: FLONASE Place 2 sprays into both nostrils daily.   free water Soln Place 200 mLs into feeding tube every 6 (six) hours.   GaviLAX 17 GM/SCOOP powder Generic drug: polyethylene glycol powder Place 1 capful (17 g) into feeding tube daily as needed for mild constipation or moderate constipation.   glycopyrrolate 1 MG tablet Commonly known as: ROBINUL Place 1 tablet (1 mg total) into feeding tube 3 (three) times daily.   hydrALAZINE 50 MG tablet Commonly known as: APRESOLINE Place 1 tablet (50 mg total) into feeding tube 3 (three) times daily.   ipratropium-albuterol 0.5-2.5 (3) MG/3ML Soln Commonly known as: DUONEB TAKE 3 MLS BY NEBULIZATION EVERY 4 (FOUR) HOURS AS NEEDED (WHEEZING, SHORTNESS OF BREATH). USE FIRST BEFORE ALBUTEROL   isosorbide dinitrate 30 MG tablet Commonly known as: ISORDIL PLACE 1 TABLET (30 MG TOTAL) INTO FEEDING TUBE 3 (THREE) TIMES DAILY.   levETIRAcetam 100 MG/ML solution Commonly known as: KEPPRA PLACE 7.5 MLS (750 MG TOTAL) INTO FEEDING TUBE 2 (TWO) TIMES DAILY.   Loperamide HCl 1 MG/7.5ML Liqd Place 15 mLs (2 mg total) into feeding tube daily as needed for diarrhea or loose stools.   mouth rinse Liqd solution  15 mLs by Mouth Rinse route every 4 (four) hours.   Nebulizer System All-In-One Misc 1 Inhalation by Does not apply route every hour as needed.   scopolamine 1 MG/3DAYS Commonly known as: TRANSDERM-SCOP Place 1 patch (1.5 mg total) onto the skin every 3 (three) days.   spironolactone 25 MG tablet Commonly known as: ALDACTONE Place 1 tablet (25 mg total) into feeding tube daily.   valproic acid 250 MG/5ML solution Commonly known as: DEPAKENE Place 15 mLs (750 mg total) into feeding tube 3 (three) times daily.   ZINC OXIDE (TOPICAL) 25 % Oint Apply 1 Application topically 2 (two)  times daily. To affected area.        Follow-up Information     [facility] [place]. Schedule an appointment as soon as possible for a visit in 1 week(s).   Contact information: [place] [address] [phone]                Allergies  Allergen Reactions   Zestril [Lisinopril] Swelling    angioedema    Consultations: Pulmonary   Procedures/Studies: DG Chest Port 1 View  Result Date: 01/18/2022 CLINICAL DATA:  Atelectasis, tracheostomy EXAM: PORTABLE CHEST 1 VIEW COMPARISON:  01/05/2022 FINDINGS: Tracheostomy is unchanged. Cardiomegaly, vascular congestion. Very low lung volumes with perihilar opacities, favor edema. No effusions or pneumothorax. No acute bony abnormality. IMPRESSION: Cardiomegaly, vascular congestion, probable mild pulmonary edema. Very low lung volumes. Electronically Signed   By: Rolm Baptise M.D.   On: 01/18/2022 19:47   DG Abd 1 View  Result Date: 01/07/2022 CLINICAL DATA:  Nausea and vomiting for 2 days EXAM: ABDOMEN - 1 VIEW COMPARISON:  08/15/2021 FINDINGS: Gastrostomy catheter is noted in place. No free air is noted. Scattered large and small bowel gas is seen. Chronic bony changes are noted about the right hip. No acute bony abnormality is seen. IMPRESSION: No acute abnormality noted. Electronically Signed   By: Inez Catalina M.D.   On: 01/07/2022 09:53   DG Neck Soft Tissue  Result Date: 01/06/2022 CLINICAL DATA:  Evaluate tracheostomy EXAM: NECK SOFT TISSUES - 1+ VIEW COMPARISON:  Chest radiographs done on 01/05/2022 FINDINGS: Tip of tracheostomy is 4.2 cm above the carina. There is no significant narrowing of trachea. There is no distention of hypopharynx. Epiglottis is not optimally visualized. No opaque foreign bodies are seen. Degenerative changes are noted in mid and lower cervical spine. IMPRESSION: Tip of tracheostomy is 4.2 cm above the carina. There is no significant narrowing of trachea. Epiglottis is not  adequately visualized. Cervical spondylosis. Electronically Signed   By: Elmer Picker M.D.   On: 01/06/2022 16:43   DG Chest Port 1 View  Result Date: 01/05/2022 CLINICAL DATA:  Questionable sepsis - evaluate for abnormality EXAM: PORTABLE CHEST 1 VIEW COMPARISON:  Radiograph 09/04/2021 FINDINGS: Tracheostomy tube tip at the thoracic inlet. Persistent low lung volumes. Stable heart size and mediastinal contours. Subsegmental atelectasis in the lung bases. No confluent consolidation. No pleural fluid, pneumothorax or pulmonary edema. Stable osseous structures. IMPRESSION: 1. Low lung volumes with bibasilar atelectasis. 2. Tracheostomy tube tip at the thoracic inlet. Electronically Signed   By: Keith Rake M.D.   On: 01/05/2022 23:11      Subjective: Seen and examined on day of dc.  Stable,in no visible distress  Discharge Exam: Vitals:   01/23/22 0407 01/23/22 0813  BP: 117/87 (!) 125/96  Pulse: 83 87  Resp: 18 16  Temp: 98.3 F (36.8 C) 98.4 F (  36.9 C)  SpO2: 100% 96%   Vitals:   01/22/22 2100 01/22/22 2326 01/23/22 0407 01/23/22 0813  BP: (!) 115/91 111/78 117/87 (!) 125/96  Pulse: 86 88 83 87  Resp: _0 Temp: 98.9 F (37.2 C) 97.8 F (36.6 C) 98.3 F (36.8 C) 98.4 F (36.9 C)  TempSrc: Oral Oral Axillary Oral  SpO2: 98% 100% 100% 96%  Weight:   91.7 kg   Height:        General exam: Appears chronically ill, no visible distress Respiratory system: Coarse breath sounds, trach mask 5L Cardiovascular system: S1S2, RRR, no murmur Gastrointestinal system: Soft ND, +Peg Central nervous system: Oriented x 0 Extremities: Symmetric 0 x 5 power. Skin: No rashes, lesions or ulcers Psychiatry: Unable to assess    The results of significant diagnostics from this hospitalization (including imaging, microbiology, ancillary and laboratory) are listed below for reference.     Microbiology: Recent Results (from the past 240 hour(s))  Acid Fast Smear (AFB)      Status: None   Collection Time: 01/13/22  3:29 PM   Specimen: Bronchial Washing, Left; Respiratory  Result Value Ref Range Status   AFB Specimen Processing Concentration  Final   Acid Fast Smear Negative  Final    Comment: (NOTE) Performed At: [facility] [place], [place] [id number] Rush Farmer MD OM:[id number]    Source (AFB) SPU  Final    Comment: Performed at [facility], [place] [address] [place], [place] [address]     Labs: BNP (last 3 results) Recent Labs    06/06/21 0625  BNP 62.9    Basic Metabolic Panel: Recent Labs  Lab 01/17/22 0552 01/18/22 0536 01/19/22 0650 01/20/22 0521 01/22/22 0555  NA 140 138 138 137 141  K 5.7* 4.5 4.5 4.9 4.2  CL 102 98 96* 96* 99  CO2 27 31 34* 32 36*  GLUCOSE 98 98 88 94 90  BUN 31* 28* 33* 41* 44*  CREATININE 0.84 0.83 0.93 1.19 1.15  CALCIUM 9.8 9.5 9.6 9.5 9.6  MG 2.2 2.0 2.1 2.3  --     Liver Function Tests: No results for input(s): "AST", "ALT", "ALKPHOS", "BILITOT", "PROT", "ALBUMIN" in the last 168 hours. No results for input(s): "LIPASE", "AMYLASE" in the last 168 hours. No results for input(s): "AMMONIA" in the last 168 hours. CBC: Recent Labs  Lab 01/17/22 0552 01/18/22 0536 01/19/22 0650 01/20/22 0521  WBC 5.9 7.8 7.6 7.4  HGB 13.7 11.6* 12.1* 12.2*  HCT 42.0 36.1* 37.2* 37.4*  MCV 97.7 98.9 97.1 96.6  PLT 141* 268 257 284    Cardiac Enzymes: No results for input(s): "CKTOTAL", "CKMB", "CKMBINDEX", "TROPONINI" in the last 168 hours. BNP: Invalid input(s): "POCBNP" CBG: Recent Labs  Lab 01/22/22 1558 01/22/22 2016 01/22/22 2352 01/23/22 0411 01/23/22 0810  GLUCAP 105* 162* 179* 116* 74    D-Dimer No results for input(s): "DDIMER" in the last 72 hours. Hgb A1c No results for input(s): "HGBA1C" in the last 72 hours. Lipid Profile No results for input(s): "CHOL", "HDL", "LDLCALC", "TRIG", "CHOLHDL", "LDLDIRECT" in the last 72 hours. Thyroid function  studies No results for input(s): "TSH", "T4TOTAL", "T3FREE", "THYROIDAB" in the last 72 hours.  Invalid input(s): "FREET3" Anemia work up No results for input(s): "VITAMINB12", "FOLATE", "FERRITIN", "TIBC", "IRON", "RETICCTPCT" in the last 72 hours. Urinalysis    Component Value Date/Time   COLORURINE YELLOW (A) 01/05/2022 2236   APPEARANCEUR CLEAR (A) 01/05/2022 2236   LABSPEC  1.018 01/05/2022 2236   PHURINE 8.0 01/05/2022 2236   GLUCOSEU NEGATIVE 01/05/2022 2236   HGBUR NEGATIVE 01/05/2022 2236   BILIRUBINUR NEGATIVE 01/05/2022 2236   KETONESUR NEGATIVE 01/05/2022 2236   PROTEINUR NEGATIVE 01/05/2022 2236   NITRITE NEGATIVE 01/05/2022 2236   LEUKOCYTESUR NEGATIVE 01/05/2022 2236   Sepsis Labs Recent Labs  Lab 01/17/22 0552 01/18/22 0536 01/19/22 0650 01/20/22 0521  WBC 5.9 7.8 7.6 7.4    Microbiology Recent Results (from the past 240 hour(s))  Acid Fast Smear (AFB)     Status: None   Collection Time: 01/13/22  3:29 PM   Specimen: Bronchial Washing, Left; Respiratory  Result Value Ref Range Status   AFB Specimen Processing Concentration  Final   Acid Fast Smear Negative  Final    Comment: (NOTE) Performed At: [facility] [address] [place], [place] [id number] Rush Farmer MD TI:[id number]    Source (AFB) SPU  Final    Comment: Performed at [facility], [address], [place], [place] [address]     Time coordinating discharge: Over 30 minutes  SIGNED:   Annita Brod, MD  [facility] 01/23/2022, 10:11 AM Pager   If 7PM-7AM, please contact night-coverage

## 2022-01-23 NOTE — Progress Notes (Signed)
       CROSS COVER NOTE  NAME: Billy Cherry MRN: [id number] DOB : Mar 22, 1970 ATTENDING PHYSICIAN: No att. providers found   Notified by Cathie Beams RN that [name] was discharged home with PIV still in place. Requested RN call local EMS/police to go to patient's home and remove PIV.   This document was prepared using Dragon voice recognition software and may include unintentional dictation errors.  Bishop Limbo DNP, MBA, FNP-BC Nurse Practitioner [facility] Pager [phone]

## 2022-01-23 NOTE — Progress Notes (Addendum)
EMS came and get pt for tranport. Pt handed paper work.

## 2022-01-31 ENCOUNTER — Other Ambulatory Visit: Payer: Self-pay | Admitting: Family Medicine

## 2022-01-31 DIAGNOSIS — G40909 Epilepsy, unspecified, not intractable, without status epilepticus: Secondary | ICD-10-CM

## 2022-01-31 DIAGNOSIS — I5022 Chronic systolic (congestive) heart failure: Secondary | ICD-10-CM

## 2022-02-15 ENCOUNTER — Telehealth (INDEPENDENT_AMBULATORY_CARE_PROVIDER_SITE_OTHER): Payer: BC Managed Care – PPO | Admitting: Family Medicine

## 2022-02-15 ENCOUNTER — Other Ambulatory Visit: Payer: Self-pay | Admitting: Family Medicine

## 2022-02-15 DIAGNOSIS — G40909 Epilepsy, unspecified, not intractable, without status epilepticus: Secondary | ICD-10-CM

## 2022-02-15 DIAGNOSIS — J189 Pneumonia, unspecified organism: Secondary | ICD-10-CM

## 2022-02-15 DIAGNOSIS — R52 Pain, unspecified: Secondary | ICD-10-CM

## 2022-02-15 MED ORDER — AMOXICILLIN-POT CLAVULANATE 250-62.5 MG/5ML PO SUSR: 500.0000 mg | Freq: Three times a day (TID) | ORAL | 0 refills | Status: AC

## 2022-02-15 MED ORDER — TRAMADOL HCL 50 MG PO TABS: 50.0000 mg | ORAL_TABLET | Freq: Two times a day (BID) | ORAL | 0 refills | Status: DC | PRN

## 2022-02-15 NOTE — Telephone Encounter (Signed)
Reviewed CXR which show LLL pneumonia. Abx written for. Discussed with wife who vu. She will pickup abx. We discussed pain concerns with bath. Will try ultram after pt has time to improve with abx.   PDMP reviewed during this encounter.  Haydee Salter, MD

## 2022-02-17 ENCOUNTER — Other Ambulatory Visit: Payer: Self-pay | Admitting: Family Medicine

## 2022-02-22 ENCOUNTER — Ambulatory Visit: Payer: BC Managed Care – PPO | Admitting: Family Medicine

## 2022-02-24 NOTE — Telephone Encounter (Cosign Needed)
See notes

## 2022-02-27 ENCOUNTER — Other Ambulatory Visit: Payer: Self-pay | Admitting: Family Medicine

## 2022-02-27 DIAGNOSIS — G40909 Epilepsy, unspecified, not intractable, without status epilepticus: Secondary | ICD-10-CM

## 2022-03-02 ENCOUNTER — Telehealth: Payer: Self-pay

## 2022-03-02 ENCOUNTER — Other Ambulatory Visit: Payer: Self-pay | Admitting: Family Medicine

## 2022-03-02 DIAGNOSIS — R1110 Vomiting, unspecified: Secondary | ICD-10-CM

## 2022-03-02 DIAGNOSIS — R52 Pain, unspecified: Secondary | ICD-10-CM

## 2022-03-02 NOTE — Telephone Encounter (Incomplete)
Patient's wife calling in about Reglan.  States that pharmacy patient has a refill.  I called the pharmacy and there are 3-4 refills.

## 2022-03-06 ENCOUNTER — Ambulatory Visit: Payer: BC Managed Care – PPO | Admitting: Family Medicine

## 2022-03-08 ENCOUNTER — Ambulatory Visit: Payer: BC Managed Care – PPO | Admitting: Family Medicine

## 2022-03-08 ENCOUNTER — Other Ambulatory Visit: Payer: Self-pay | Admitting: Family Medicine

## 2022-03-08 DIAGNOSIS — J189 Pneumonia, unspecified organism: Secondary | ICD-10-CM

## 2022-03-08 MED ORDER — DOXYCYCLINE MONOHYDRATE 25 MG/5ML PO SUSR: 100.0000 mg | Freq: Two times a day (BID) | ORAL | 0 refills | Status: DC

## 2022-03-08 MED ORDER — CEPHALEXIN 250 MG/5ML PO SUSR: 500.0000 mg | Freq: Two times a day (BID) | ORAL | 0 refills | Status: AC

## 2022-03-08 MED ORDER — GUAIFENESIN 100 MG/5ML PO LIQD: 10.0000 mL | Freq: Four times a day (QID) | ORAL | 0 refills | Status: DC | PRN

## 2022-03-08 NOTE — Progress Notes (Unsigned)
   Established Patient Office Visit  Subjective   Patient ID: Billy Cherry, male    DOB: 03-24-1970  Age: 51 y.o. MRN: [id number]  No chief complaint on file.   HPI  {History (Optional):23778}  ROS    Objective:     There were no vitals taken for this visit. {Vitals History (Optional):23777}  Physical Exam   No results found for any visits on 03/08/22.  {Labs (Optional):23779}  The ASCVD Risk score (Arnett DK, et al., 2019) failed to calculate for the following reasons:   The valid total cholesterol range is 130 to 320 mg/dL    Assessment & Plan:   Problem List Items Addressed This Visit   None   No follow-ups on file.    Elwin Mocha, MD

## 2022-03-15 ENCOUNTER — Other Ambulatory Visit: Payer: Self-pay | Admitting: Family Medicine

## 2022-03-15 DIAGNOSIS — G931 Anoxic brain damage, not elsewhere classified: Secondary | ICD-10-CM

## 2022-03-15 DIAGNOSIS — G40909 Epilepsy, unspecified, not intractable, without status epilepticus: Secondary | ICD-10-CM

## 2022-03-22 ENCOUNTER — Other Ambulatory Visit: Payer: Self-pay | Admitting: Family Medicine

## 2022-03-22 ENCOUNTER — Telehealth (INDEPENDENT_AMBULATORY_CARE_PROVIDER_SITE_OTHER): Payer: BC Managed Care – PPO | Admitting: Family Medicine

## 2022-03-22 DIAGNOSIS — Z930 Tracheostomy status: Secondary | ICD-10-CM

## 2022-03-22 DIAGNOSIS — R509 Fever, unspecified: Secondary | ICD-10-CM

## 2022-03-22 DIAGNOSIS — G931 Anoxic brain damage, not elsewhere classified: Secondary | ICD-10-CM

## 2022-03-22 MED ORDER — AMOXICILLIN-POT CLAVULANATE 250-62.5 MG/5ML PO SUSR: 500.0000 mg | Freq: Three times a day (TID) | ORAL | 0 refills | Status: DC

## 2022-03-22 MED ORDER — OXYCODONE HCL 5 MG/5ML PO SOLN: 2.5000 mg | Freq: Four times a day (QID) | ORAL | 0 refills | Status: DC | PRN

## 2022-03-22 NOTE — Telephone Encounter (Signed)
Nurse for pt called req abx due to material found in pt's  suctioning water. Augment written for.  Pain med also written for. Sch for tomorrow for family conference.  Will f/u with nurse tomorrow.  Elwin Mocha, MD

## 2022-04-09 ENCOUNTER — Ambulatory Visit: Payer: BC Managed Care – PPO | Admitting: Family Medicine

## 2022-04-10 ENCOUNTER — Ambulatory Visit: Payer: BC Managed Care – PPO | Admitting: Family Medicine

## 2022-04-10 DIAGNOSIS — Z978 Presence of other specified devices: Secondary | ICD-10-CM

## 2022-04-10 DIAGNOSIS — Z930 Tracheostomy status: Secondary | ICD-10-CM

## 2022-04-10 DIAGNOSIS — G40909 Epilepsy, unspecified, not intractable, without status epilepticus: Secondary | ICD-10-CM

## 2022-04-10 DIAGNOSIS — J189 Pneumonia, unspecified organism: Secondary | ICD-10-CM

## 2022-04-11 LAB — ACID FAST CULTURE WITH REFLEXED SENSITIVITIES (MYCOBACTERIA): Acid Fast Culture: POSITIVE — AB

## 2022-04-11 LAB — ORG ID BY SEQUENCING RFLX AST

## 2022-04-11 LAB — SLOW GROWER BROTH SUSCEP.
Ciprofloxacin: >8
Clarithromycin: 0.5
Doxycycline: >8
Linezolid: >32
Minocycline: >8
Moxifloxacin: >4
Rifampin: >4
Streptomycin: 2

## 2022-04-11 LAB — AFB ORGANISM ID BY DNA PROBE: M tuberculosis complex: NEGATIVE

## 2022-04-16 ENCOUNTER — Other Ambulatory Visit: Payer: Self-pay | Admitting: Family Medicine

## 2022-04-16 DIAGNOSIS — I5022 Chronic systolic (congestive) heart failure: Secondary | ICD-10-CM

## 2022-04-20 ENCOUNTER — Other Ambulatory Visit: Payer: Self-pay | Admitting: Family Medicine

## 2022-04-24 ENCOUNTER — Telehealth: Payer: Self-pay | Admitting: Family Medicine

## 2022-04-24 DIAGNOSIS — Z930 Tracheostomy status: Secondary | ICD-10-CM

## 2022-04-24 DIAGNOSIS — G931 Anoxic brain damage, not elsewhere classified: Secondary | ICD-10-CM

## 2022-04-24 DIAGNOSIS — G40909 Epilepsy, unspecified, not intractable, without status epilepticus: Secondary | ICD-10-CM

## 2022-04-24 DIAGNOSIS — F419 Anxiety disorder, unspecified: Secondary | ICD-10-CM

## 2022-04-24 MED ORDER — CLONAZEPAM 0.5 MG PO TABS: 0.5000 mg | ORAL_TABLET | Freq: Two times a day (BID) | ORAL | 1 refills | Status: DC | PRN

## 2022-04-24 MED ORDER — OXYCODONE HCL 5 MG/5ML PO SOLN: 2.5000 mg | Freq: Four times a day (QID) | ORAL | 0 refills | Status: DC | PRN

## 2022-04-24 NOTE — Telephone Encounter (Signed)
PDMP reviewed during this encounter.   Klonopin refilled.  Elwin Mocha, MD

## 2022-04-24 NOTE — Telephone Encounter (Signed)
PDMP reviewed during this encounter.   Oxycodone refilled.  Elwin Mocha, MD

## 2022-04-25 NOTE — Telephone Encounter (Signed)
done 

## 2022-04-26 ENCOUNTER — Ambulatory Visit: Payer: BC Managed Care – PPO | Admitting: Family Medicine

## 2022-04-27 ENCOUNTER — Ambulatory Visit: Payer: BC Managed Care – PPO | Admitting: Family Medicine

## 2022-04-27 VITALS — BP 128/82 | HR 96 | Temp 98.6°F | Resp 14

## 2022-04-28 MED ORDER — DESITIN 40 % EX PSTE: 1.0000 | PASTE | Freq: Two times a day (BID) | CUTANEOUS | 1 refills | Status: AC

## 2022-04-28 NOTE — Progress Notes (Unsigned)
   Established Patient Office Visit  Subjective   Patient ID: Billy Cherry, male    DOB: 1970/10/04  Age: 52 y.o. MRN: [id number]  No chief complaint on file.   HPI  {History (Optional):23778}  ROS    Objective:     There were no vitals taken for this visit. {Vitals History (Optional):23777}  Physical Exam   No results found for any visits on 04/27/22.  {Labs (Optional):23779}  The ASCVD Risk score (Arnett DK, et al., 2019) failed to calculate for the following reasons:   The valid total cholesterol range is 130 to 320 mg/dL    Assessment & Plan:   Problem List Items Addressed This Visit       Cardiovascular and Mediastinum   HTN (hypertension) (Chronic)   Other Visit Diagnoses     Vomiting, unspecified vomiting type, unspecified whether nausea present    -  Primary   Skin irritation       Relevant Medications   Zinc Oxide (DESITIN) 40 % PSTE   Seizure disorder ([name])       Gastroesophageal reflux disease without esophagitis           No follow-ups on file.    Elwin Mocha, MD

## 2022-04-30 ENCOUNTER — Encounter: Payer: Self-pay | Admitting: Family Medicine

## 2022-04-30 MED ORDER — ONDANSETRON HCL 4 MG PO TABS: 4.0000 mg | ORAL_TABLET | Freq: Three times a day (TID) | ORAL | 0 refills | Status: DC | PRN

## 2022-04-30 MED ORDER — GUAIFENESIN 100 MG/5ML PO LIQD: 5.0000 mL | Freq: Four times a day (QID) | ORAL | 1 refills | Status: DC

## 2022-05-01 ENCOUNTER — Other Ambulatory Visit: Payer: Self-pay | Admitting: Family Medicine

## 2022-05-01 DIAGNOSIS — G40909 Epilepsy, unspecified, not intractable, without status epilepticus: Secondary | ICD-10-CM

## 2022-05-01 DIAGNOSIS — Z930 Tracheostomy status: Secondary | ICD-10-CM

## 2022-05-01 DIAGNOSIS — F419 Anxiety disorder, unspecified: Secondary | ICD-10-CM

## 2022-05-01 DIAGNOSIS — I5022 Chronic systolic (congestive) heart failure: Secondary | ICD-10-CM

## 2022-05-01 DIAGNOSIS — R1110 Vomiting, unspecified: Secondary | ICD-10-CM

## 2022-05-01 DIAGNOSIS — G931 Anoxic brain damage, not elsewhere classified: Secondary | ICD-10-CM

## 2022-05-09 MED ORDER — IPRATROPIUM-ALBUTEROL 0.5-2.5 (3) MG/3ML IN SOLN: RESPIRATORY_TRACT | 0 refills | Status: DC

## 2022-05-09 MED ORDER — SPIRONOLACTONE 25 MG PO TABS: 25.0000 mg | ORAL_TABLET | Freq: Every day | ORAL | 0 refills | Status: AC

## 2022-05-09 MED ORDER — CARVEDILOL 6.25 MG PO TABS: 6.2500 mg | ORAL_TABLET | Freq: Two times a day (BID) | ORAL | 1 refills | Status: AC

## 2022-05-09 MED ORDER — CLONAZEPAM 0.5 MG PO TABS: 0.5000 mg | ORAL_TABLET | Freq: Two times a day (BID) | ORAL | 1 refills | Status: DC | PRN

## 2022-05-09 MED ORDER — ISOSORBIDE DINITRATE 30 MG PO TABS: 30.0000 mg | ORAL_TABLET | Freq: Three times a day (TID) | ORAL | 0 refills | Status: DC

## 2022-05-09 MED ORDER — ONDANSETRON HCL 4 MG PO TABS: 4.0000 mg | ORAL_TABLET | Freq: Three times a day (TID) | ORAL | 0 refills | Status: AC | PRN

## 2022-05-09 MED ORDER — VALPROIC ACID 250 MG/5ML PO SOLN: 750.0000 mg | Freq: Three times a day (TID) | ORAL | 3 refills | Status: AC

## 2022-05-09 MED ORDER — OXYCODONE HCL 5 MG/5ML PO SOLN: 2.5000 mg | Freq: Four times a day (QID) | ORAL | 0 refills | Status: DC | PRN

## 2022-05-09 MED ORDER — SCOPOLAMINE 1 MG/3DAYS TD PT72: MEDICATED_PATCH | TRANSDERMAL | 2 refills | Status: DC

## 2022-05-09 NOTE — Telephone Encounter (Signed)
PDMP reviewed during this encounter.  Refilled multiple meds. Will cont to see pt monthly.  Elwin Mocha, MD

## 2022-05-17 ENCOUNTER — Emergency Department: Payer: BC Managed Care – PPO

## 2022-05-17 ENCOUNTER — Other Ambulatory Visit: Payer: Self-pay

## 2022-05-17 ENCOUNTER — Inpatient Hospital Stay
Admission: EM | Admit: 2022-05-17 | Discharge: 2022-05-23 | DRG: 202 | Disposition: A | Discharge status: Home Health | Payer: BC Managed Care – PPO | Attending: Internal Medicine | Admitting: Internal Medicine

## 2022-05-17 DIAGNOSIS — R1115 Cyclical vomiting syndrome unrelated to migraine: Secondary | ICD-10-CM | POA: Diagnosis not present

## 2022-05-17 DIAGNOSIS — Z930 Tracheostomy status: Secondary | ICD-10-CM

## 2022-05-17 DIAGNOSIS — I5022 Chronic systolic (congestive) heart failure: Secondary | ICD-10-CM

## 2022-05-17 DIAGNOSIS — I110 Hypertensive heart disease with heart failure: Secondary | ICD-10-CM | POA: Diagnosis present

## 2022-05-17 DIAGNOSIS — Z7901 Long term (current) use of anticoagulants: Secondary | ICD-10-CM

## 2022-05-17 DIAGNOSIS — Z86711 Personal history of pulmonary embolism: Secondary | ICD-10-CM

## 2022-05-17 DIAGNOSIS — I2699 Other pulmonary embolism without acute cor pulmonale: Secondary | ICD-10-CM | POA: Diagnosis present

## 2022-05-17 DIAGNOSIS — J0410 Acute tracheitis without obstruction: Secondary | ICD-10-CM | POA: Diagnosis present

## 2022-05-17 DIAGNOSIS — Z888 Allergy status to other drugs, medicaments and biological substances status: Secondary | ICD-10-CM

## 2022-05-17 DIAGNOSIS — J9621 Acute and chronic respiratory failure with hypoxia: Secondary | ICD-10-CM | POA: Diagnosis not present

## 2022-05-17 DIAGNOSIS — G931 Anoxic brain damage, not elsewhere classified: Secondary | ICD-10-CM | POA: Diagnosis present

## 2022-05-17 DIAGNOSIS — J209 Acute bronchitis, unspecified: Secondary | ICD-10-CM | POA: Diagnosis not present

## 2022-05-17 DIAGNOSIS — Z86718 Personal history of other venous thrombosis and embolism: Secondary | ICD-10-CM

## 2022-05-17 DIAGNOSIS — Z1152 Encounter for screening for COVID-19: Secondary | ICD-10-CM

## 2022-05-17 DIAGNOSIS — Z9981 Dependence on supplemental oxygen: Secondary | ICD-10-CM

## 2022-05-17 DIAGNOSIS — R403 Persistent vegetative state: Secondary | ICD-10-CM | POA: Diagnosis present

## 2022-05-17 DIAGNOSIS — Z79899 Other long term (current) drug therapy: Secondary | ICD-10-CM

## 2022-05-17 DIAGNOSIS — J9811 Atelectasis: Secondary | ICD-10-CM | POA: Diagnosis present

## 2022-05-17 DIAGNOSIS — J40 Bronchitis, not specified as acute or chronic: Secondary | ICD-10-CM | POA: Diagnosis not present

## 2022-05-17 DIAGNOSIS — Z931 Gastrostomy status: Secondary | ICD-10-CM

## 2022-05-17 DIAGNOSIS — I10 Essential (primary) hypertension: Secondary | ICD-10-CM | POA: Diagnosis present

## 2022-05-17 LAB — MRSA NEXT GEN BY PCR, NASAL: MRSA by PCR Next Gen: NOT DETECTED

## 2022-05-17 LAB — CBC WITH DIFFERENTIAL/PLATELET
Abs Immature Granulocytes: 0.09 10*3/uL — ABNORMAL HIGH (ref 0.00–0.07)
Basophils Absolute: 0 10*3/uL (ref 0.0–0.1)
Basophils Relative: 0 %
Eosinophils Absolute: 0.2 10*3/uL (ref 0.0–0.5)
Eosinophils Relative: 1 %
HCT: 37.7 % — ABNORMAL LOW (ref 39.0–52.0)
Hemoglobin: 11.5 g/dL — ABNORMAL LOW (ref 13.0–17.0)
Immature Granulocytes: 1 %
Lymphocytes Relative: 15 %
Lymphs Abs: 1.9 10*3/uL (ref 0.7–4.0)
MCH: 32 pg (ref 26.0–34.0)
MCHC: 30.5 g/dL (ref 30.0–36.0)
MCV: 105 fL — ABNORMAL HIGH (ref 80.0–100.0)
Monocytes Absolute: 2 10*3/uL — ABNORMAL HIGH (ref 0.1–1.0)
Monocytes Relative: 15 %
Neutro Abs: 8.5 10*3/uL — ABNORMAL HIGH (ref 1.7–7.7)
Neutrophils Relative %: 68 %
Platelets: 189 10*3/uL (ref 150–400)
RBC: 3.59 MIL/uL — ABNORMAL LOW (ref 4.22–5.81)
RDW: 14.2 % (ref 11.5–15.5)
WBC: 12.7 10*3/uL — ABNORMAL HIGH (ref 4.0–10.5)
nRBC: 0 % (ref 0.0–0.2)

## 2022-05-17 LAB — COMPREHENSIVE METABOLIC PANEL
ALT: 19 U/L (ref 0–44)
AST: 27 U/L (ref 15–41)
Albumin: 3.1 g/dL — ABNORMAL LOW (ref 3.5–5.0)
Alkaline Phosphatase: 60 U/L (ref 38–126)
Anion gap: 9 (ref 5–15)
BUN: 64 mg/dL — ABNORMAL HIGH (ref 6–20)
CO2: 32 mmol/L (ref 22–32)
Calcium: 9.4 mg/dL (ref 8.9–10.3)
Chloride: 99 mmol/L (ref 98–111)
Creatinine, Ser: 1.1 mg/dL (ref 0.61–1.24)
GFR, Estimated: >60 mL/min (ref >60)
Glucose, Bld: 95 mg/dL (ref 70–99)
Potassium: 4.7 mmol/L (ref 3.5–5.1)
Sodium: 140 mmol/L (ref 135–145)
Total Bilirubin: 0.6 mg/dL (ref 0.3–1.2)
Total Protein: 7.4 g/dL (ref 6.5–8.1)

## 2022-05-17 LAB — RESP PANEL BY RT-PCR (RSV, FLU A&B, COVID)  RVPGX2
Influenza A by PCR: NEGATIVE
Influenza B by PCR: NEGATIVE
Resp Syncytial Virus by PCR: NEGATIVE
SARS Coronavirus 2 by RT PCR: NEGATIVE

## 2022-05-17 LAB — LACTIC ACID, PLASMA
Lactic Acid, Venous: 2 mmol/L (ref 0.5–1.9)
Lactic Acid, Venous: 2 mmol/L (ref 0.5–1.9)

## 2022-05-17 LAB — GLUCOSE, CAPILLARY: Glucose-Capillary: 107 mg/dL — ABNORMAL HIGH (ref 70–99)

## 2022-05-17 LAB — APTT: aPTT: 36 seconds (ref 24–36)

## 2022-05-17 LAB — PROTIME-INR
INR: 1.2 (ref 0.8–1.2)
Prothrombin Time: 14.6 seconds (ref 11.4–15.2)

## 2022-05-17 MED ORDER — ACETAMINOPHEN 325 MG PO TABS: 650.0000 mg | ORAL_TABLET | Freq: Four times a day (QID) | ORAL | Status: DC | PRN

## 2022-05-17 MED ORDER — ATROPINE SULFATE 1 % OP SOLN
2.0000 [drp] | Freq: Four times a day (QID) | OPHTHALMIC | Status: DC
  Administered 2022-05-17 – 2022-05-23 (×23): 2 [drp] via SUBLINGUAL
  Filled 2022-05-17: qty 2

## 2022-05-17 MED ORDER — ISOSORBIDE DINITRATE 30 MG PO TABS
30.0000 mg | ORAL_TABLET | Freq: Three times a day (TID) | ORAL | Status: DC
  Administered 2022-05-18 – 2022-05-23 (×15): 30 mg
  Filled 2022-05-17 (×16): qty 1

## 2022-05-17 MED ORDER — CLONAZEPAM 0.5 MG PO TABS
0.5000 mg | ORAL_TABLET | Freq: Two times a day (BID) | ORAL | Status: DC
  Administered 2022-05-17 – 2022-05-23 (×12): 0.5 mg
  Filled 2022-05-17 (×12): qty 1

## 2022-05-17 MED ORDER — SPIRONOLACTONE 25 MG PO TABS: 25.0000 mg | ORAL_TABLET | Freq: Every day | ORAL | Status: DC

## 2022-05-17 MED ORDER — ONDANSETRON HCL 4 MG/2ML IJ SOLN
4.0000 mg | Freq: Four times a day (QID) | INTRAMUSCULAR | Status: DC | PRN
  Administered 2022-05-18 – 2022-05-20 (×3): 4 mg via INTRAVENOUS
  Filled 2022-05-17 (×3): qty 2

## 2022-05-17 MED ORDER — IPRATROPIUM-ALBUTEROL 0.5-2.5 (3) MG/3ML IN SOLN
3.0000 mL | Freq: Four times a day (QID) | RESPIRATORY_TRACT | Status: DC
  Administered 2022-05-17 – 2022-05-23 (×23): 3 mL via RESPIRATORY_TRACT
  Filled 2022-05-17 (×23): qty 3

## 2022-05-17 MED ORDER — SODIUM CHLORIDE 0.9 % IV SOLN
2.0000 g | Freq: Once | INTRAVENOUS | Status: AC
  Administered 2022-05-17: 2 g via INTRAVENOUS
  Filled 2022-05-17: qty 12.5

## 2022-05-17 MED ORDER — SODIUM CHLORIDE 0.9% FLUSH
3.0000 mL | Freq: Two times a day (BID) | INTRAVENOUS | Status: DC
  Administered 2022-05-17 – 2022-05-23 (×12): 3 mL via INTRAVENOUS

## 2022-05-17 MED ORDER — APIXABAN 5 MG PO TABS
5.0000 mg | ORAL_TABLET | Freq: Two times a day (BID) | ORAL | Status: DC
  Administered 2022-05-17 – 2022-05-23 (×12): 5 mg
  Filled 2022-05-17 (×12): qty 1

## 2022-05-17 MED ORDER — LACTATED RINGERS IV SOLN: INTRAVENOUS | Status: AC

## 2022-05-17 MED ORDER — ACETAMINOPHEN 650 MG RE SUPP: 650.0000 mg | Freq: Four times a day (QID) | RECTAL | Status: DC | PRN

## 2022-05-17 MED ORDER — ONDANSETRON HCL 4 MG PO TABS: 4.0000 mg | ORAL_TABLET | Freq: Four times a day (QID) | ORAL | Status: DC | PRN

## 2022-05-17 MED ORDER — METOCLOPRAMIDE HCL 10 MG/10ML PO SOLN
10.0000 mg | Freq: Four times a day (QID) | ORAL | Status: DC
  Administered 2022-05-17: 10 mg via ORAL
  Filled 2022-05-17 (×3): qty 10

## 2022-05-17 MED ORDER — LEVETIRACETAM 100 MG/ML PO SOLN
750.0000 mg | Freq: Two times a day (BID) | ORAL | Status: DC
  Administered 2022-05-17 – 2022-05-23 (×12): 750 mg
  Filled 2022-05-17 (×12): qty 7.5

## 2022-05-17 MED ORDER — IPRATROPIUM-ALBUTEROL 0.5-2.5 (3) MG/3ML IN SOLN
3.0000 mL | Freq: Once | RESPIRATORY_TRACT | Status: AC
  Administered 2022-05-17: 3 mL via RESPIRATORY_TRACT
  Filled 2022-05-17: qty 3

## 2022-05-17 MED ORDER — CHLORHEXIDINE GLUCONATE 0.12% ORAL RINSE (MEDLINE KIT)
15.0000 mL | Freq: Two times a day (BID) | OROMUCOSAL | Status: DC
  Administered 2022-05-17 – 2022-05-23 (×11): 15 mL via OROMUCOSAL

## 2022-05-17 MED ORDER — LACTATED RINGERS IV BOLUS (SEPSIS)
1000.0000 mL | Freq: Once | INTRAVENOUS | Status: AC
  Administered 2022-05-17: 1000 mL via INTRAVENOUS

## 2022-05-17 MED ORDER — CARVEDILOL 6.25 MG PO TABS
6.2500 mg | ORAL_TABLET | Freq: Two times a day (BID) | ORAL | Status: DC
  Administered 2022-05-17 – 2022-05-23 (×11): 6.25 mg
  Filled 2022-05-17 (×11): qty 1

## 2022-05-17 MED ORDER — HYDRALAZINE HCL 50 MG PO TABS
50.0000 mg | ORAL_TABLET | Freq: Three times a day (TID) | ORAL | Status: DC
  Administered 2022-05-17 – 2022-05-23 (×16): 50 mg
  Filled 2022-05-17 (×16): qty 1

## 2022-05-17 MED ORDER — METOCLOPRAMIDE HCL 10 MG/10ML PO SOLN
10.0000 mg | Freq: Four times a day (QID) | ORAL | Status: DC
  Administered 2022-05-18: 10 mg via ORAL
  Filled 2022-05-17 (×2): qty 10

## 2022-05-17 MED ORDER — DOXAZOSIN MESYLATE 2 MG PO TABS
2.0000 mg | ORAL_TABLET | Freq: Every day | ORAL | Status: DC
  Administered 2022-05-17 – 2022-05-22 (×6): 2 mg
  Filled 2022-05-17 (×6): qty 1

## 2022-05-17 MED ORDER — SODIUM CHLORIDE 0.9 % IV SOLN
2.0000 g | Freq: Three times a day (TID) | INTRAVENOUS | Status: DC
  Administered 2022-05-17 – 2022-05-18 (×3): 2 g via INTRAVENOUS
  Filled 2022-05-17 (×4): qty 12.5

## 2022-05-17 MED ORDER — VANCOMYCIN HCL IN DEXTROSE 1-5 GM/200ML-% IV SOLN
1000.0000 mg | Freq: Once | INTRAVENOUS | Status: AC
  Administered 2022-05-17: 1000 mg via INTRAVENOUS
  Filled 2022-05-17: qty 200

## 2022-05-17 MED ORDER — VALPROIC ACID 250 MG/5ML PO SOLN
750.0000 mg | Freq: Three times a day (TID) | ORAL | Status: DC
  Administered 2022-05-17 – 2022-05-23 (×18): 750 mg
  Filled 2022-05-17 (×19): qty 15

## 2022-05-17 MED ORDER — SCOPOLAMINE 1 MG/3DAYS TD PT72
1.0000 | MEDICATED_PATCH | TRANSDERMAL | Status: DC
  Administered 2022-05-18 – 2022-05-21 (×2): 1.5 mg via TRANSDERMAL
  Filled 2022-05-17 (×2): qty 1

## 2022-05-17 MED ORDER — FREE WATER
200.0000 mL | Freq: Four times a day (QID) | Status: DC
  Administered 2022-05-17 – 2022-05-18 (×4): 200 mL

## 2022-05-17 MED ORDER — FAMOTIDINE 20 MG PO TABS
20.0000 mg | ORAL_TABLET | Freq: Two times a day (BID) | ORAL | Status: DC
  Administered 2022-05-17 – 2022-05-23 (×12): 20 mg
  Filled 2022-05-17 (×12): qty 1

## 2022-05-17 MED ORDER — GLYCOPYRROLATE 1 MG PO TABS
1.0000 mg | ORAL_TABLET | Freq: Three times a day (TID) | ORAL | Status: DC
  Administered 2022-05-17 – 2022-05-23 (×18): 1 mg
  Filled 2022-05-17 (×18): qty 1

## 2022-05-17 NOTE — Consult Note (Signed)
CODE SEPSIS - PHARMACY COMMUNICATION  **Broad Spectrum Antibiotics should be administered within 1 hour of Sepsis diagnosis**  Time Code Sepsis Called/Page Received: 1113  Antibiotics Ordered: Cefepime and Vancomycin  Time of 1st antibiotic administration: cefepime at 1153 (give prior to blood cultures)  Additional action taken by pharmacy: none  If necessary, Name of Provider/Nurse Contacted: n/a    Soriya Worster Rodriguez-Guzman PharmD, BCPS 05/17/2022 11:55 AM

## 2022-05-17 NOTE — Assessment & Plan Note (Addendum)
Patient is presenting with several day history of increased secretions and cough, in addition to elevated WBC, tachycardia, tachypnea and low-grade temperature.  Patient meets SIRS criteria, but no evidence of endorgan damage to suggest sepsis.  Given prior history of Pseudomonas, will treat with cefepime and obtain repeat tracheal aspirate.  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  No evidence of pneumonia or cavitary lesions on CT of the chest.   - Cefepime per pharmacy dosing - Tracheal culture pending - AFB culture x 2 pending - S/p 1 L bolus - Continue maintenance fluids overnight - DuoNebs every 6 hours given patient is on albuterol at home

## 2022-05-17 NOTE — Assessment & Plan Note (Signed)
Patient is on spironolactone, carvedilol, Imdur and hydralazine at home.  Blood pressure is within goal at this time and patient received his home medications this morning.  - Restart home medications with exception of spironolactone

## 2022-05-17 NOTE — ED Notes (Signed)
Respiratory at bedside suctioning patient.

## 2022-05-17 NOTE — H&P (Addendum)
History and Physical    Patient: Billy Cherry [id number] DOB: June 03, 1970 DOA: 05/17/2022 DOS: the patient was seen and examined on 05/17/2022 PCP: [facility] [place]  Patient coming from: Home  Chief Complaint:  Chief Complaint  Patient presents with   Cough   HPI: Billy Cherry is a 52 y.o. male with medical history significant of anoxic brain injury and persistent vegetative state status post tracheostomy and PEG tube placement, chronic systolic CHF, hypertension, DVT on chronic anticoagulation who presents to the ED with increased tracheal secretions and cough.  History obtained through chart review due to patient's anoxic brain injury and no family at bedside  Per chart, patient was brought into the ED due to increased tracheal secretions and cough.  Tracheal secretions were light yellow in color.  This began several days ago and family has been having trouble with suctioning.  No reported fever, chills.  ED Course:  On arrival to the ED, patient was normotensive at 122/95 with heart rate of 107.  He was saturating at 100% on home 8 L supplemental oxygen via tracheal cough.  He was afebrile at 99.9.  Initial workup notable for WBC of 12.7, hemoglobin of 11.7, MCV 1 5, potassium 4.7, BUN 64, creatinine 1.10, GFR above 60, AST 27, ALT 19.  Lactic acid elevated 2.0 with flat trend.  INR within normal limits at 1.2.  COVID-19, RSV and influenza negative. CT of the chest was obtained that demonstrated bilateral posterior and subsegmental lower lobe atelectasis, mild mosaic attenuation within the upper lung zones.  Patient was started on cefepime due to prior history of Pseudomonas.  TRH contacted for admission.  Review of Systems: Unable to obtain as patient is nonverbal  Past Medical History:  Diagnosis Date   Hypertension    Past Surgical History:  Procedure Laterality Date   BACK SURGERY     CORONARY/GRAFT ACUTE MI REVASCULARIZATION N/A 03/23/2021   Procedure:  Coronary/Graft Acute MI Revascularization;  Surgeon: Isaias Cowman, MD;  Location: [facility];  Service: Cardiovascular;  Laterality: N/A;   IR GASTROSTOMY TUBE MOD SED  04/26/2021   LEFT HEART CATH AND CORONARY ANGIOGRAPHY N/A 03/23/2021   Procedure: LEFT HEART CATH AND CORONARY ANGIOGRAPHY;  Surgeon: Isaias Cowman, MD;  Location: [facility];  Service: Cardiovascular;  Laterality: N/A;   SHOULDER SURGERY     Social History:  reports that he has never smoked. He has never used smokeless tobacco. He reports current alcohol use. He reports that he does not use drugs.  Allergies  Allergen Reactions   Zestril [Lisinopril] Swelling    angioedema    No family history on file.  Prior to Admission medications   Medication Sig Start Date End Date Taking? Authorizing Provider  albuterol (PROVENTIL) (2.5 MG/3ML) 0.083% nebulizer solution Take 3 mLs (2.5 mg total) by nebulization every 6 (six) hours as needed for wheezing or shortness of breath. 11/11/21   Elwin Mocha, MD  amoxicillin-clavulanate (AUGMENTIN) 250-62.5 MG/5ML suspension Take 10 mLs (500 mg total) by mouth 3 (three) times daily. 03/22/22   Elwin Mocha, MD  apixaban (ELIQUIS) 5 MG TABS tablet Place 1 tablet (5 mg total) into feeding tube 2 (two) times daily. 11/14/21 05/13/22  Elwin Mocha, MD  atropine 1 % ophthalmic solution Place 2 drops under the tongue 4 (four) times daily. 11/07/21   Elwin Mocha, MD  carvedilol (COREG) 6.25 MG tablet Place 1 tablet (6.25 mg total) into feeding tube 2 (two) times daily with  a meal. 05/09/22 07/08/22  Elwin Mocha, MD  chlorhexidine gluconate, MEDLINE KIT, (PERIDEX) 0.12 % solution 15 mLs by Mouth Rinse route 2 (two) times daily. 03/30/21   Bradly Bienenstock, NP  clonazePAM (KLONOPIN) 0.5 MG tablet PLACE 1 TABLET (0.5 MG TOTAL) INTO FEEDING TUBE 2 (TWO) TIMES DAILY. 02/21/22 03/23/22  Elwin Mocha, MD  clonazePAM (KLONOPIN) 0.5 MG tablet Take 1 tablet (0.5  mg total) by mouth 2 (two) times daily as needed for anxiety. 05/09/22   Elwin Mocha, MD  doxazosin (CARDURA) 2 MG tablet PLACE 1 TABLET (2 MG TOTAL) INTO FEEDING TUBE DAILY. 04/20/22 10/17/22  Elwin Mocha, MD  famotidine (PEPCID) 20 MG tablet PLACE 1 TABLET (20 MG TOTAL) INTO FEEDING TUBE 2 (TWO) TIMES DAILY. 03/20/22 09/16/22  Elwin Mocha, MD  fiber (NUTRISOURCE FIBER) PACK packet Place 1 packet into feeding tube 2 (two) times daily. 10/19/21   Dwyane Dee, MD  fluticasone (FLONASE) 50 MCG/ACT nasal spray Place 2 sprays into both nostrils daily. 12/29/21   Elwin Mocha, MD  GAVILAX 17 GM/SCOOP powder Place 1 capful (17 g) into feeding tube daily as needed for mild constipation or moderate constipation. 12/29/21   Elwin Mocha, MD  glycopyrrolate (ROBINUL) 1 MG tablet Place 1 tablet (1 mg total) into feeding tube 3 (three) times daily. 11/14/21 05/13/22  Elwin Mocha, MD  guaiFENesin (ROBITUSSIN) 100 MG/5ML liquid Place 10 mLs into feeding tube every 6 (six) hours as needed for cough or to loosen phlegm. 03/08/22   Elwin Mocha, MD  guaiFENesin (ROBITUSSIN) 100 MG/5ML liquid Take 5 mLs by mouth every 6 (six) hours. 04/30/22 06/29/22  Elwin Mocha, MD  hydrALAZINE (APRESOLINE) 50 MG tablet Place 1 tablet (50 mg total) into feeding tube 3 (three) times daily. 11/14/21 05/13/22  Elwin Mocha, MD  ipratropium-albuterol (DUONEB) 0.5-2.5 (3) MG/3ML SOLN TAKE 3 ML BY NEBULIZATION EVERY 4 HOUR AS NEED (WHEEZING, SHORTNESS OF BREATH). USE FIRST BEFORE ALBUTEROL 05/09/22   Elwin Mocha, MD  isosorbide dinitrate (ISORDIL) 30 MG tablet Place 1 tablet (30 mg total) into feeding tube 3 (three) times daily. 05/09/22   Elwin Mocha, MD  levETIRAcetam (KEPPRA) 100 MG/ML solution PLACE 7.5 MLS (750 MG TOTAL) INTO FEEDING TUBE 2 (TWO) TIMES DAILY. 05/09/22   Elwin Mocha, MD  Loperamide HCl 1 MG/7.5ML LIQD Place 15 mLs (2 mg total) into feeding tube daily as needed for diarrhea or loose  stools. 11/14/21   Elwin Mocha, MD  Mouthwashes (MOUTH RINSE) LIQD solution 15 mLs by Mouth Rinse route every 4 (four) hours. 03/30/21   Bradly Bienenstock, NP  Nebulizer System All-In-One MISC 1 Inhalation by Does not apply route every hour as needed. 11/11/21   Elwin Mocha, MD  Nutritional Supplements (FEEDING SUPPLEMENT, OSMOLITE 1.5 CAL,) LIQD Place 1,000 mLs into feeding tube continuous. 10/04/21   Shelly Coss, MD  ondansetron (ZOFRAN) 4 MG tablet Place 1 tablet (4 mg total) into feeding tube every 8 (eight) hours as needed for nausea or vomiting. Crush finely and place through G tube, followed by a flush 05/09/22 06/08/22  Elwin Mocha, MD  oxyCODONE (ROXICODONE) 5 MG/5ML solution Take 2.5 mLs (2.5 mg total) by mouth every 6 (six) hours as needed for severe pain. 05/09/22   Elwin Mocha, MD  scopolamine (TRANSDERM-SCOP) 1 MG/3DAYS PLACE 1 PATCH ONTO THE SKIN EVERY 3 DAYS. 05/09/22   Elwin Mocha, MD  spironolactone (ALDACTONE) 25 MG  tablet Place 1 tablet (25 mg total) into feeding tube daily. 05/09/22 08/07/22  Elwin Mocha, MD  valproic acid (DEPAKENE) 250 MG/5ML solution Place 15 mLs (750 mg total) into feeding tube 3 (three) times daily. 05/09/22 11/05/22  Elwin Mocha, MD  Water For Irrigation, Sterile (FREE WATER) SOLN Place 200 mLs into feeding tube every 6 (six) hours. 08/07/21   Pokhrel, Corrie Mckusick, MD  Zinc Oxide (DESITIN) 40 % PSTE Apply 1 Application topically in the morning and at bedtime. 04/28/22 06/27/22  Elwin Mocha, MD    Physical Exam: Vitals:   05/17/22 1330 05/17/22 1400 05/17/22 1430 05/17/22 1500  BP: (!) 119/99 (!) 128/102 (!) 118/90 109/70  Pulse: (!) 106 (!) 103 99 97  Resp: (!) 28 15 (!) 23 (!) 28  Temp:      TempSrc:      SpO2: 98% 99% 92% 96%  Weight: 91.7 kg     Height: 6' (1.829 m)      Physical Exam Vitals and nursing note reviewed.  Constitutional:      Comments: Patient is nonverbal, opens at eyes to touch  HENT:     Head: Normocephalic and  atraumatic.  Pulmonary:     Effort: Tachypnea present.     Breath sounds: Rhonchi (Throughout) present. No decreased breath sounds, wheezing or rales.     Comments: Thick yellow secretions seen coming from tracheal tube Abdominal:     General: Bowel sounds are normal. There is no distension.     Palpations: Abdomen is soft.  Musculoskeletal:     Right lower leg: No edema.     Left lower leg: No edema.  Skin:    General: Skin is warm and dry.  Neurological:     Comments:  Patient appears at baseline, which is nonverbal and generally unresponsive to environment.    Data Reviewed: CBC with WBC of 12.7, hemoglobin of 11.5, platelets of 189 CMP with sodium of 140, potassium 4.7, chloride 99, bicarb 32, glucose 95, BUN 64, creatinine 1.10, albumin 3.1, AST 27, ALT 19, GFR above 60. Lactic acid 2.0, with repeat of 2.0 INR within normal limits at 1.2 PTT within normal limits at 36 Influenza, RSV and COVID-19 PCR negative  EKG personally reviewed.  Sinus rhythm with rate of 106.  J-point elevation in lateral leads however no acute changes concerning for acute ischemia.  Borderline QT prolongation at 478.  CT CHEST WO CONTRAST  Result Date: 05/17/2022 CLINICAL DATA:  Respiratory illness. Increased secretions and cough. EXAM: CT CHEST WITHOUT CONTRAST TECHNIQUE: Multidetector CT imaging of the chest was performed following the standard protocol without IV contrast. RADIATION DOSE REDUCTION: This exam was performed according to the departmental dose-optimization program which includes automated exposure control, adjustment of the mA and/or kV according to patient size and/or use of iterative reconstruction technique. COMPARISON:  08/24/2021 FINDINGS: Cardiovascular: Mild cardiac enlargement. Ascending thoracic aorta measures 4 cm in diameter. Aortic atherosclerosis. Mediastinum/Nodes: Tracheostomy tube is in place with tip above the carina. Unremarkable appearance of the esophagus and thyroid  gland. No mediastinal or axillary adenopathy. Lungs/Pleura: No pleural fluid or airspace consolidation. Mild mosaic attenuation within the upper lung zones. No signs of pneumothorax. Bilateral posterior segmental and subsegmental lower lobe atelectasis and volume loss is identified. Upper Abdomen: No acute abnormality. Upper pole right kidney cyst noted. No follow-up imaging recommended. Musculoskeletal: No chest wall mass or suspicious bone lesions identified. IMPRESSION: 1. Bilateral posterior segmental and subsegmental lower lobe atelectasis and volume loss. 2.  Mild mosaic attenuation within the upper lung zones which may reflect underlying small airways disease. 3. Ascending thoracic aorta measures 4 cm in diameter. Recommend annual imaging followup by CTA or MRA. This recommendation follows 2010 ACCF/AHA/AATS/ACR/ASA/SCA/SCAI/SIR/STS/SVM Guidelines for the Diagnosis and Management of Patients with Thoracic Aortic Disease. Circulation. 2010; [id number]. Aortic aneurysm NOS (ICD10-I71.9) 4.  Aortic Atherosclerosis (ICD10-I70.0). Electronically Signed   By: Kerby Moors M.D.   On: 05/17/2022 12:54   DG Chest Port 1 View  Result Date: 05/17/2022 CLINICAL DATA:  Evaluate for sepsis. Increased tracheal secretions and cough. EXAM: PORTABLE CHEST 1 VIEW COMPARISON:  01/18/2022 FINDINGS: Tracheostomy tube tip is 3.7 cm above the carina. Stable cardiac enlargement. Lung volumes are low. No pleural effusion, interstitial edema or airspace disease. IMPRESSION: 1. Low lung volumes. 2. Stable cardiac enlargement. Electronically Signed   By: Kerby Moors M.D.   On: 05/17/2022 11:32    Results are pending, will review when available.  Assessment and Plan:  * Acute tracheobronchitis Patient is presenting with several day history of increased secretions and cough, in addition to elevated WBC, tachycardia, tachypnea and low-grade temperature.  Patient meets SIRS criteria, but no evidence of endorgan damage to  suggest sepsis.  Given prior history of Pseudomonas, will treat with cefepime and obtain repeat tracheal aspirate.  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  No evidence of pneumonia or cavitary lesions on CT of the chest.   - Cefepime per pharmacy dosing - Tracheal culture pending - AFB culture x 2 pending - S/p 1 L bolus - Continue maintenance fluids overnight - DuoNebs every 6 hours given patient is on albuterol at home  Acute on chronic respiratory failure with hypoxia ([place]) Patient is currently on 8 L via trach collar, previously on 5 L.  Likely secondary to increased secretion and mucous plugging.  No pneumonia seen on CT of the chest, however notable for bilateral atelectasis.  - Continue supplemental oxygen to maintain oxygen saturation above 88% - Wean as tolerated back to home 5 L - Management of tracheobronchitis as noted above  Chronic systolic CHF (congestive heart failure) ([place]) On examination, patient appears euvolemic.  Will hold home diuretics and resume when able  - Continue home GDMT - Hold home diuretics  HTN (hypertension) Patient is on spironolactone, carvedilol, Imdur and hydralazine at home.  Blood pressure is within goal at this time and patient received his home medications this morning.  - Restart home medications with exception of spironolactone   Pulmonary embolus ([place]) - Continue home Eliquis  Anoxic brain injury ([place]) - Home medications continued, including Klonopin, Keppra, and valproic acid - Dietitian consultation for tube feed  Advance Care Planning:   Code Status: Full Code patient does not have capacity to make medical decisions and no family at bedside.  Consults: None  Family Communication: No family at bedside  Severity of Illness: The appropriate patient status for this patient is OBSERVATION. Observation status is judged to be reasonable and necessary in order to provide the required intensity of  service to ensure the patient's safety. The patient's presenting symptoms, physical exam findings, and initial radiographic and laboratory data in the context of their medical condition is felt to place them at decreased risk for further clinical deterioration. Furthermore, it is anticipated that the patient will be medically stable for discharge from the hospital within 2 midnights of admission.   Author: Jose Persia, MD 05/17/2022 5:15 PM  For on call review  [email or web address]

## 2022-05-17 NOTE — ED Provider Notes (Signed)
[facility] Provider Note    None    (approximate)   History   Cough   HPI  Billy Cherry is a 52 y.o. male with complex past medical history including chronic trach dependence with chronic hypoxia, here with increased tracheal secretions and fatigue.  History provided primarily by EMS and wife report to them.  Patient's caregiver is also at bedside.  Per report, the patient said increasing the thick secretions over the last several days.  They have had some difficulty getting them out.  This is similar to his previous episodes of pneumonia or tracheitis.  He is due to have his tracheostomy replaced soon.  No known trauma.  No bleeding.     Physical Exam   Triage Vital Signs: ED Triage Vitals  Enc Vitals Group     BP      Pulse      Resp      Temp      Temp src      SpO2      Weight      Height      Head Circumference      Peak Flow      Pain Score      Pain Loc      Pain Edu?      Excl. in [place]?     Most recent vital signs: Vitals:   05/17/22 1900 05/17/22 2000  BP: 98/72 103/72  Pulse: 93 84  Resp: (!) 21 20  Temp:    SpO2: 98% 100%     General: Awake, no distress.  CV:  Good peripheral perfusion.  Mild tachycardia noted. Resp:  Tachypneic, transmitted upper airway sounds with frequent coughing from tracheostomy.  There is yellow-green secretions noted in the tracheostomy which appears patent.  No bleeding. Abd:  No distention.  Other:  No lower extremity edema or asymmetry.   ED Results / Procedures / Treatments   Labs (all labs ordered are listed, but only abnormal results are displayed) Labs Reviewed  LACTIC ACID, PLASMA - Abnormal; Notable for the following components:      Result Value   Lactic Acid, Venous 2.0 (*)    All other components within normal limits  LACTIC ACID, PLASMA - Abnormal; Notable for the following components:   Lactic Acid, Venous 2.0 (*)    All other components within normal limits   COMPREHENSIVE METABOLIC PANEL - Abnormal; Notable for the following components:   BUN 64 (*)    Albumin 3.1 (*)    All other components within normal limits  CBC WITH DIFFERENTIAL/PLATELET - Abnormal; Notable for the following components:   WBC 12.7 (*)    RBC 3.59 (*)    Hemoglobin 11.5 (*)    HCT 37.7 (*)    MCV 105.0 (*)    Neutro Abs 8.5 (*)    Monocytes Absolute 2.0 (*)    Abs Immature Granulocytes 0.09 (*)    All other components within normal limits  GLUCOSE, CAPILLARY - Abnormal; Notable for the following components:   Glucose-Capillary 107 (*)    All other components within normal limits  RESP PANEL BY RT-PCR (RSV, FLU A&B, COVID)  RVPGX2  MRSA NEXT GEN BY PCR, NASAL  CULTURE, BLOOD (ROUTINE X 2)  CULTURE, BLOOD (ROUTINE X 2)  CULTURE, RESPIRATORY W GRAM STAIN  ACID FAST SMEAR (AFB, MYCOBACTERIA)  ACID FAST CULTURE WITH REFLEXED SENSITIVITIES (MYCOBACTERIA)  PROTIME-INR  APTT  CBC  BASIC METABOLIC PANEL  EKG Sinus tachycardia, ventricular 106.  PR 151, QRS 93, QTc 478.  No acute ST elevations or depressions.  No EKG evidence of acute ischemia or infarct   RADIOLOGY Chest x-ray: Low lung volumes   I also independently reviewed and agree with radiologist interpretations.   PROCEDURES:  Critical Care performed: No  .1-3 Lead EKG Interpretation  Performed by: Duffy Bruce, MD Authorized by: Duffy Bruce, MD     Interpretation: normal     ECG rate:  80-90   ECG rate assessment: normal     Rhythm: sinus rhythm     Ectopy: none     Conduction: normal   Comments:     Indication: SOB     MEDICATIONS ORDERED IN ED: Medications  sodium chloride flush (NS) 0.9 % injection 3 mL (3 mLs Intravenous Not Given 05/17/22 1430)  ondansetron (ZOFRAN) tablet 4 mg (has no administration in time range)    Or  ondansetron (ZOFRAN) injection 4 mg (has no administration in time range)  ceFEPIme (MAXIPIME) 2 g in sodium chloride 0.9 % 100 mL IVPB (has no  administration in time range)  apixaban (ELIQUIS) tablet 5 mg (has no administration in time range)  atropine 1 % ophthalmic solution 2 drop (2 drops Sublingual Given 05/17/22 1808)  carvedilol (COREG) tablet 6.25 mg (6.25 mg Per Tube Given 05/17/22 1810)  clonazePAM (KLONOPIN) tablet 0.5 mg (has no administration in time range)  doxazosin (CARDURA) tablet 2 mg (has no administration in time range)  glycopyrrolate (ROBINUL) tablet 1 mg (1 mg Per Tube Given 05/17/22 1810)  hydrALAZINE (APRESOLINE) tablet 50 mg (has no administration in time range)  levETIRAcetam (KEPPRA) 100 MG/ML solution 750 mg (has no administration in time range)  isosorbide dinitrate (ISORDIL) tablet 30 mg (has no administration in time range)  scopolamine (TRANSDERM-SCOP) 1 MG/3DAYS 1.5 mg (has no administration in time range)  free water 200 mL (200 mLs Per Tube Given 05/17/22 1810)  valproic acid (DEPAKENE) 250 MG/5ML solution 750 mg (750 mg Per Tube Given 05/17/22 1810)  famotidine (PEPCID) tablet 20 mg (has no administration in time range)  chlorhexidine gluconate (MEDLINE KIT) (PERIDEX) 0.12 % solution 15 mL (has no administration in time range)  acetaminophen (TYLENOL) tablet 650 mg (has no administration in time range)    Or  acetaminophen (TYLENOL) suppository 650 mg (has no administration in time range)  ipratropium-albuterol (DUONEB) 0.5-2.5 (3) MG/3ML nebulizer solution 3 mL (3 mLs Nebulization Given 05/17/22 2005)  lactated ringers infusion ( Intravenous New Bag/Given 05/17/22 1817)  metoCLOPramide (REGLAN) 10 MG/10ML solution 10 mg (has no administration in time range)  lactated ringers bolus 1,000 mL (0 mLs Intravenous Stopped 05/17/22 1343)  vancomycin (VANCOCIN) IVPB 1000 mg/200 mL premix (0 mg Intravenous Stopped 05/17/22 1312)  ceFEPIme (MAXIPIME) 2 g in sodium chloride 0.9 % 100 mL IVPB (0 g Intravenous Stopped 05/17/22 1233)  ipratropium-albuterol (DUONEB) 0.5-2.5 (3) MG/3ML nebulizer solution 3 mL (3 mLs  Nebulization Given 05/17/22 1142)  ipratropium-albuterol (DUONEB) 0.5-2.5 (3) MG/3ML nebulizer solution 3 mL (3 mLs Nebulization Given 05/17/22 1142)     IMPRESSION / MDM / ASSESSMENT AND PLAN / ED COURSE  I reviewed the triage vital signs and the nursing notes.                              Differential diagnosis includes, but is not limited to, bacterial tracheitis, trach-associated PNA, CHF, bronchospasm, mucus plugging.  Patient's presentation is most consistent  with acute presentation with potential threat to life or bodily function.  The patient is on the cardiac monitor to evaluate for evidence of arrhythmia and/or significant heart rate changes   52 yo M here with acute on chronic dyspnea with increased trach secretions. Pt has thick, yellow secretions on exam and at bedside but is satting well on his baseline. LA 2.0. CBC with mild leukocytosis. CT Chest shows atelectasis, mucus plugging, no large PNA. Suspect tracheitis. Discussed with pt's wife. Will start on IV ABX with coverage for his PSA he previously grew, admit to medicine.   FINAL CLINICAL IMPRESSION(S) / ED DIAGNOSES   Final diagnoses:  Tracheitis     Rx / DC Orders   ED Discharge Orders     None        Note:  This document was prepared using Dragon voice recognition software and may include unintentional dictation errors.   Duffy Bruce, MD 05/17/22 2015

## 2022-05-17 NOTE — Sepsis Progress Note (Signed)
Elink monitoring for the code sepsis protocol.  

## 2022-05-17 NOTE — Assessment & Plan Note (Signed)
-   Home medications continued, including Klonopin, Keppra, and valproic acid - Dietitian consultation for tube feed

## 2022-05-17 NOTE — ED Notes (Signed)
Suctioned patient

## 2022-05-17 NOTE — Consult Note (Signed)
PHARMACY -  BRIEF ANTIBIOTIC NOTE   Pharmacy has received consult(s) for Vancomycin and Cefepime from an ED provider.  The patient's profile has been reviewed for ht/wt/allergies/indication/available labs.    One time order(s) placed for : Cefepime 2gm IVPB X 1 dose Vancomycin '1000mg'$  IVPB x 1 dose  Further antibiotics/pharmacy consults should be ordered by admitting physician if indicated.                       Thank you, Drena Ham Rodriguez-Guzman PharmD, BCPS 05/17/2022 11:16 AM

## 2022-05-17 NOTE — Assessment & Plan Note (Signed)
Continue home Eliquis. 

## 2022-05-17 NOTE — Assessment & Plan Note (Signed)
Patient is currently on 8 L via trach collar, previously on 5 L.  Likely secondary to increased secretion and mucous plugging.  No pneumonia seen on CT of the chest, however notable for bilateral atelectasis.  - Continue supplemental oxygen to maintain oxygen saturation above 88% - Wean as tolerated back to home 5 L - Management of tracheobronchitis as noted above

## 2022-05-17 NOTE — Consult Note (Signed)
Pharmacy Antibiotic Note  Billy Cherry is a 52 y.o. male admitted on 05/17/2022 with  tracheitis with prior hx of pseudomonas .  Pharmacy has been consulted for cefepime dosing.  Plan: Cefepime 2gm  UVPB q 8hrs  Height: 6' (182.9 cm) Weight: 91.7 kg (202 lb 2.6 oz) IBW/kg (Calculated) : 77.6  Temp (24hrs), Avg:99.9 F (37.7 C), Min:99.9 F (37.7 C), Max:99.9 F (37.7 C)  Recent Labs  Lab 05/17/22 1134  WBC 12.7*  CREATININE 1.10  LATICACIDVEN 2.0*    Estimated Creatinine Clearance: 87.2 mL/min (by C-G formula based on SCr of 1.1 mg/dL).    Allergies  Allergen Reactions   Zestril [Lisinopril] Swelling    angioedema    Antimicrobials this admission: 3/14 Vancomycin x 1 dose 3/14 cefepime >>   Dose adjustments this admission: none  Microbiology results: 3/14 BCx: in process  Thank you for allowing pharmacy to be a part of this patient's care.  Tyneshia Stivers Rodriguez-Guzman PharmD, BCPS 05/17/2022 2:47 PM

## 2022-05-17 NOTE — ED Notes (Signed)
Report given to Christi RN

## 2022-05-17 NOTE — Assessment & Plan Note (Signed)
On examination, patient appears euvolemic.  Will hold home diuretics and resume when able  - Continue home GDMT - Hold home diuretics

## 2022-05-17 NOTE — ED Triage Notes (Signed)
Pt arrives by [facility] from home for increased trach secretions and cough.  Secretions light yellow in color. Pt has caregiver who gave him albuterol treatment prior to arrival. Pt is non verbal and non ambulatory due to previous brain injury per ems. Respiratory at bedside.

## 2022-05-18 ENCOUNTER — Other Ambulatory Visit: Payer: Self-pay

## 2022-05-18 DIAGNOSIS — Z931 Gastrostomy status: Secondary | ICD-10-CM | POA: Diagnosis not present

## 2022-05-18 DIAGNOSIS — Z86718 Personal history of other venous thrombosis and embolism: Secondary | ICD-10-CM | POA: Diagnosis not present

## 2022-05-18 DIAGNOSIS — I110 Hypertensive heart disease with heart failure: Secondary | ICD-10-CM | POA: Diagnosis present

## 2022-05-18 DIAGNOSIS — R1115 Cyclical vomiting syndrome unrelated to migraine: Secondary | ICD-10-CM | POA: Diagnosis not present

## 2022-05-18 DIAGNOSIS — Z1152 Encounter for screening for COVID-19: Secondary | ICD-10-CM | POA: Diagnosis not present

## 2022-05-18 DIAGNOSIS — Z7901 Long term (current) use of anticoagulants: Secondary | ICD-10-CM | POA: Diagnosis not present

## 2022-05-18 DIAGNOSIS — J9811 Atelectasis: Secondary | ICD-10-CM | POA: Diagnosis present

## 2022-05-18 DIAGNOSIS — I5022 Chronic systolic (congestive) heart failure: Secondary | ICD-10-CM | POA: Diagnosis present

## 2022-05-18 DIAGNOSIS — J40 Bronchitis, not specified as acute or chronic: Secondary | ICD-10-CM | POA: Diagnosis present

## 2022-05-18 DIAGNOSIS — Z930 Tracheostomy status: Secondary | ICD-10-CM | POA: Diagnosis not present

## 2022-05-18 DIAGNOSIS — J0410 Acute tracheitis without obstruction: Secondary | ICD-10-CM | POA: Diagnosis present

## 2022-05-18 DIAGNOSIS — G931 Anoxic brain damage, not elsewhere classified: Secondary | ICD-10-CM | POA: Diagnosis present

## 2022-05-18 DIAGNOSIS — Z86711 Personal history of pulmonary embolism: Secondary | ICD-10-CM | POA: Diagnosis not present

## 2022-05-18 DIAGNOSIS — Z888 Allergy status to other drugs, medicaments and biological substances status: Secondary | ICD-10-CM | POA: Diagnosis not present

## 2022-05-18 DIAGNOSIS — J209 Acute bronchitis, unspecified: Secondary | ICD-10-CM | POA: Diagnosis present

## 2022-05-18 DIAGNOSIS — Z9981 Dependence on supplemental oxygen: Secondary | ICD-10-CM | POA: Diagnosis not present

## 2022-05-18 DIAGNOSIS — Z79899 Other long term (current) drug therapy: Secondary | ICD-10-CM | POA: Diagnosis not present

## 2022-05-18 DIAGNOSIS — R403 Persistent vegetative state: Secondary | ICD-10-CM | POA: Diagnosis present

## 2022-05-18 DIAGNOSIS — J9621 Acute and chronic respiratory failure with hypoxia: Secondary | ICD-10-CM | POA: Diagnosis present

## 2022-05-18 LAB — BASIC METABOLIC PANEL
Anion gap: 7 (ref 5–15)
BUN: 48 mg/dL — ABNORMAL HIGH (ref 6–20)
CO2: 31 mmol/L (ref 22–32)
Calcium: 9.4 mg/dL (ref 8.9–10.3)
Chloride: 102 mmol/L (ref 98–111)
Creatinine, Ser: 1.04 mg/dL (ref 0.61–1.24)
GFR, Estimated: >60 mL/min (ref >60)
Glucose, Bld: 84 mg/dL (ref 70–99)
Potassium: 4.7 mmol/L (ref 3.5–5.1)
Sodium: 140 mmol/L (ref 135–145)

## 2022-05-18 LAB — CBC
HCT: 35.9 % — ABNORMAL LOW (ref 39.0–52.0)
Hemoglobin: 11.1 g/dL — ABNORMAL LOW (ref 13.0–17.0)
MCH: 32.2 pg (ref 26.0–34.0)
MCHC: 30.9 g/dL (ref 30.0–36.0)
MCV: 104.1 fL — ABNORMAL HIGH (ref 80.0–100.0)
Platelets: 167 10*3/uL (ref 150–400)
RBC: 3.45 MIL/uL — ABNORMAL LOW (ref 4.22–5.81)
RDW: 14.3 % (ref 11.5–15.5)
WBC: 10.2 10*3/uL (ref 4.0–10.5)
nRBC: 0 % (ref 0.0–0.2)

## 2022-05-18 MED ORDER — OSMOLITE 1.5 CAL PO LIQD
1000.0000 mL | ORAL | Status: DC
  Administered 2022-05-18 – 2022-05-23 (×4): 1000 mL

## 2022-05-18 MED ORDER — POLYETHYLENE GLYCOL 3350 17 G PO PACK
17.0000 g | PACK | Freq: Every day | ORAL | Status: DC
  Administered 2022-05-18 – 2022-05-23 (×6): 17 g
  Filled 2022-05-18 (×6): qty 1

## 2022-05-18 MED ORDER — CHLORHEXIDINE GLUCONATE CLOTH 2 % EX PADS
6.0000 | MEDICATED_PAD | Freq: Every day | CUTANEOUS | Status: DC
  Administered 2022-05-18 – 2022-05-23 (×6): 6 via TOPICAL

## 2022-05-18 MED ORDER — PROSOURCE TF20 ENFIT COMPATIBL EN LIQD
60.0000 mL | Freq: Every day | ENTERAL | Status: DC
  Administered 2022-05-19 – 2022-05-22 (×3): 60 mL

## 2022-05-18 MED ORDER — METOCLOPRAMIDE HCL 10 MG/10ML PO SOLN
10.0000 mg | Freq: Four times a day (QID) | ORAL | Status: DC
  Administered 2022-05-18 – 2022-05-20 (×7): 10 mg
  Filled 2022-05-18 (×9): qty 10

## 2022-05-18 MED ORDER — ORAL CARE MOUTH RINSE
15.0000 mL | OROMUCOSAL | Status: DC
  Administered 2022-05-18 – 2022-05-23 (×17): 15 mL via OROMUCOSAL

## 2022-05-18 MED ORDER — FREE WATER
30.0000 mL | Status: DC
  Administered 2022-05-18 – 2022-05-21 (×17): 30 mL

## 2022-05-18 MED ORDER — ORAL CARE MOUTH RINSE: 15.0000 mL | OROMUCOSAL | Status: DC | PRN

## 2022-05-18 MED ORDER — PIPERACILLIN-TAZOBACTAM 3.375 G IVPB
3.3750 g | Freq: Three times a day (TID) | INTRAVENOUS | Status: DC
  Administered 2022-05-18 – 2022-05-20 (×5): 3.375 g via INTRAVENOUS
  Filled 2022-05-18 (×5): qty 50

## 2022-05-18 MED ORDER — GUAIFENESIN 100 MG/5ML PO LIQD
5.0000 mL | ORAL | Status: DC | PRN
  Administered 2022-05-18 – 2022-05-19 (×2): 5 mL
  Filled 2022-05-18 (×2): qty 10

## 2022-05-18 NOTE — Progress Notes (Signed)
Progress Note   Patient: Shreyan Nabong [id number] DOB: [birth year] DOA: 05/17/2022     0 DOS: the patient was seen and examined on 05/18/2022   Brief hospital course:  Breckyn Gair is a 52 y.o. male with medical history significant of anoxic brain injury and persistent vegetative state status post tracheostomy and PEG tube placement, chronic systolic CHF, hypertension, DVT on chronic anticoagulation who presents to the ED with increased tracheal secretions and cough.  History obtained through chart review due to patient's anoxic brain injury and no family at bedside   Per chart, patient was brought into the ED due to increased tracheal secretions and cough.  Tracheal secretions were light yellow in color.  This began several days ago and family has been having trouble with suctioning.  No reported fever, chills.   ED Course:  On arrival to the ED, patient was normotensive at 122/95 with heart rate of 107.  He was saturating at 100% on home 8 L supplemental oxygen via tracheal cough.  He was afebrile at 99.9.  Initial workup notable for WBC of 12.7, hemoglobin of 11.7, MCV 1 5, potassium 4.7, BUN 64, creatinine 1.10, GFR above 60, AST 27, ALT 19.  Lactic acid elevated 2.0 with flat trend.  INR within normal limits at 1.2.  COVID-19, RSV and influenza negative. CT of the chest was obtained that demonstrated bilateral posterior and subsegmental lower lobe atelectasis, mild mosaic attenuation within the upper lung zones.  Patient was started on cefepime due to prior history of Pseudomonas.  TRH contacted for admission.      Assessment and Plan:  * Acute tracheobronchitis Patient presented with several day history of increased secretions and cough, in addition to elevated WBC, tachycardia, tachypnea and low-grade temperature.  Patient meets SIRS criteria, but no evidence of endorgan damage to suggest sepsis.  Given prior history of Pseudomonas, will treat with cefepime and obtain repeat  tracheal aspirate.  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  No evidence of pneumonia or cavitary lesions on CT of the chest.  Follow up results of sputum culture     Acute on chronic respiratory failure with hypoxia ([place]) Patient is currently on 8 L via trach collar, previously on 5 L.   Likely secondary to increased secretion and mucous plugging.   No pneumonia seen on CT of the chest, however notable for bilateral atelectasis. Continue supplemental oxygen to maintain oxygen saturation above 88%    Chronic systolic CHF (congestive heart failure) ([place]) On examination, patient appears euvolemic.   Hold home diuretics and resume when able Continue home GDMT    HTN (hypertension) Patient is on spironolactone, carvedilol, Imdur and hydralazine at home.  Blood pressure is within goal at this time and patient received his home medications this morning.      Pulmonary embolus ([name]) - Continue home Eliquis   Anoxic brain injury ([place]) - Home medications continued, including Klonopin, Keppra, and valproic acid - Dietitian consultation for tube feed      Subjective: Patient is seen and examined at the bedside.  Eyes are flickering but unable to follow commands.   Physical Exam: Vitals:   05/18/22 1100 05/18/22 1200 05/18/22 1300 05/18/22 1400  BP: 106/71 (!) 102/44 101/66 (!) 111/55  Pulse: 92 (!) 111 87 81  Resp: (!) 22 18 18 16   Temp:      TempSrc:      SpO2: 100% 99% 97% 99%  Weight:  Height:       Vitals and nursing note reviewed.  Constitutional:      Comments: Patient is nonverbal, opens at eyes to touch  HENT:     Head: Normocephalic and atraumatic.  Pulmonary:     Effort: Tachypnea present.     Breath sounds: Rhonchi (Throughout) present. No decreased breath sounds, wheezing or rales.     Comments: Thick yellow secretions seen coming from tracheal tube Abdominal:     General: Bowel sounds are normal. There is no  distension.     Palpations: Abdomen is soft.  Musculoskeletal:     Right lower leg: No edema.     Left lower leg: No edema.  Skin:    General: Skin is warm and dry.  Neurological:     Comments:  Patient appears at baseline, which is nonverbal and generally unresponsive to environment.    Data Reviewed: Labs reviewed. There are no new results to review at this time.  Family Communication:   Disposition: Status is: Inpatient Remains inpatient appropriate because: Patient is on IV antibiotics  Planned Discharge Destination: Skilled nursing facility    Time spent: 35  minutes  Author: Collier Bullock, MD 05/18/2022 3:07 PM  For on call review [email or web address].

## 2022-05-18 NOTE — Progress Notes (Addendum)
Initial Nutrition Assessment  DOCUMENTATION CODES:   Not applicable  INTERVENTION:   Osmolite 1.5@65ml /hr + ProSource TF 20- Give 69ml daily via tube  Free water flushes 29ml q4 hours to maintain tube patency   Regimen provides 2420kcal/day, 118g/day protein and 1386ml/day of free water.   Daily weights   Pt at low refeed risk; will check potassium, magnesium and phosphorus labs tomorrow.   NUTRITION DIAGNOSIS:   Inadequate oral intake related to dysphagia (pt with chronic G-tube) as evidenced by NPO status.  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  Visited pt's room today. Pt is unable to provide any nutrition related history r/t ABI. Spoke with pt's wife via phone. Pt is well known to the nutrition department from previous admissions. Pt NPO with chronic G-tube. Wife reports pt receives Osmolite 1.5@60ml /hr at home. Wife reports that patient tolerates tube feeds well but requires miralax daily at home. Wife also reports that patient has intermittent episodes of vomiting at home that were directly r/t coughing and gagging with suctioning. Wife does give tussin at home, along with reglan 10mg  QID, robinul and scopolamine. Plan today is to restart tube feeds; RD will provide continuous feeds and increase his needs while he is acutely ill. Pt is likely at low refeed risk. Per chart, pt is down 7lbs(4%) since August; this is not significant.   Medications reviewed and include: pepcid, glycopyrrolate, reglan, scopolamine, cefepime  Labs reviewed: Na 140 wnl, K 4.7  wnl  NUTRITION - FOCUSED PHYSICAL EXAM:  Flowsheet Row Most Recent Value  Orbital Region No depletion  Upper Arm Region No depletion  Thoracic and Lumbar Region No depletion  Buccal Region No depletion  Temple Region No depletion  Clavicle Bone Region No depletion  Clavicle and Acromion Bone Region No depletion  Scapular Bone Region No depletion  Dorsal Hand No depletion  Patellar Region No depletion  Anterior Thigh Region No depletion  Posterior Calf Region No depletion  Edema (RD Assessment) None  Hair Reviewed  Eyes Reviewed  Mouth Reviewed  Skin Reviewed  Nails Reviewed   Diet Order:   Diet Order             Diet NPO time specified  Diet effective now                  EDUCATION NEEDS:   No education needs have been identified at this time  Skin:  Skin Assessment: Reviewed RN Assessment  Last BM:  3/15- type 6  Height:   Ht Readings from Last 1 Encounters:  05/17/22 6' (1.829 m)    Weight:   Wt Readings from Last 1 Encounters:  05/17/22 91.7 kg    Ideal Body Weight:  80.9 kg  BMI:  Body mass index is 27.42 kg/m.  Estimated Nutritional Needs:   Kcal:  2200-2500kcal/day  Protein:  110-125g/day  Fluid:  2.0-2.3L/day  Koleen Distance MS, RD, LDN Please refer to [facility] for RD and/or RD on-call/weekend/after hours pager

## 2022-05-19 DIAGNOSIS — J209 Acute bronchitis, unspecified: Secondary | ICD-10-CM | POA: Diagnosis not present

## 2022-05-19 LAB — BASIC METABOLIC PANEL
Anion gap: 8 (ref 5–15)
BUN: 44 mg/dL — ABNORMAL HIGH (ref 6–20)
CO2: 29 mmol/L (ref 22–32)
Calcium: 9.6 mg/dL (ref 8.9–10.3)
Chloride: 101 mmol/L (ref 98–111)
Creatinine, Ser: 1.05 mg/dL (ref 0.61–1.24)
GFR, Estimated: >60 mL/min (ref >60)
Glucose, Bld: 101 mg/dL — ABNORMAL HIGH (ref 70–99)
Potassium: 4.8 mmol/L (ref 3.5–5.1)
Sodium: 138 mmol/L (ref 135–145)

## 2022-05-19 LAB — PHOSPHORUS: Phosphorus: 5.1 mg/dL — ABNORMAL HIGH (ref 2.5–4.6)

## 2022-05-19 LAB — MAGNESIUM: Magnesium: 2.2 mg/dL (ref 1.7–2.4)

## 2022-05-19 NOTE — Progress Notes (Signed)
Progress Note   Patient: Billy Cherry [id number] DOB: October 02, 1970 DOA: 05/17/2022     1 DOS: the patient was seen and examined on 05/19/2022   Brief hospital course: [name] is a 52 y.o. male with medical history significant of anoxic brain injury and persistent vegetative state status post tracheostomy and PEG tube placement, chronic systolic CHF, hypertension, DVT on chronic anticoagulation who presents to the ED with increased tracheal secretions and cough.  History obtained through chart review due to patient's anoxic brain injury and no family at bedside   Per chart, patient was brought into the ED due to increased tracheal secretions and cough.  Tracheal secretions were light yellow in color.  This began several days ago and family has been having trouble with suctioning.  No reported fever, chills.   ED Course:  On arrival to the ED, patient was normotensive at 122/95 with heart rate of 107.  He was saturating at 100% on home 8 L supplemental oxygen via tracheal cough.  He was afebrile at 99.9.  Initial workup notable for WBC of 12.7, hemoglobin of 11.7, MCV 1 5, potassium 4.7, BUN 64, creatinine 1.10, GFR above 60, AST 27, ALT 19.  Lactic acid elevated 2.0 with flat trend.  INR within normal limits at 1.2.  COVID-19, RSV and influenza negative. CT of the chest was obtained that demonstrated bilateral posterior and subsegmental lower lobe atelectasis, mild mosaic attenuation within the upper lung zones.  Patient was started on cefepime due to prior history of Pseudomonas.  TRH contacted for admission.      Assessment and Plan: * Acute tracheobronchitis Patient presented with several day history of increased secretions and cough, in addition to elevated WBC, tachycardia, tachypnea and low-grade temperature.  Patient met SIRS criteria, but no evidence of endorgan damage to suggest sepsis.  Given prior history of Pseudomonas, will treat with cefepime and obtain repeat tracheal  aspirate.   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  No evidence of pneumonia or cavitary lesions on CT of the chest.  Sputum culture yielded normal respiratory flora       Acute on chronic respiratory failure with hypoxia ([name]) Patient is currently on 30% trach collar maintaining pulse oximetry greater than 92% Likely secondary to increased secretion and mucous plugging.   No pneumonia seen on CT of the chest, however notable for bilateral atelectasis. Continue supplemental oxygen to maintain oxygen saturation above 88%     Chronic systolic CHF (congestive heart failure) ([place]) On examination, patient appears euvolemic.   Hold home diuretics and resume when able Continue home GDMT     HTN (hypertension) Patient is on spironolactone, carvedilol, Imdur and hydralazine at home.  Blood pressure is within goal at this time and patient received his home medications this morning.       Pulmonary embolus ([name]) - Continue home Eliquis   Anoxic brain injury ([place]) - Home medications continued, including Klonopin, Keppra, and valproic acid - Dietitian consultation for tube feed       Subjective: Patient is seen and examined at bedside.  Noted to have a lot of copious secretions from his trach especially when he coughs.  Physical Exam: Vitals:   05/19/22 0500 05/19/22 0736 05/19/22 1100 05/19/22 1131  BP: 114/82  (!) 128/90   Pulse: 98  92   Resp: (!) 28  (!) 26   Temp: 98.9 F (37.2 C)  98.5 F (36.9 C)   TempSrc: Oral  Oral  SpO2: 97% 99% 99% 99%  Weight: 102.3 kg     Height:       Vitals and nursing note reviewed.  Constitutional:      Comments: Patient is nonverbal, opens at eyes to touch  HENT:     Head: Normocephalic and atraumatic.  Pulmonary:     Effort: Tachypnea present.     Breath sounds: Rhonchi (Throughout) present. No decreased breath sounds, wheezing or rales.     Comments: Thick yellow secretions seen coming from  tracheal tube Abdominal:     General: Bowel sounds are normal. There is no distension.     Palpations: Abdomen is soft.  Musculoskeletal:     Right lower leg: No edema.     Left lower leg: No edema.  Skin:    General: Skin is warm and dry.  Neurological:     Comments:  Patient appears at baseline, which is nonverbal and generally unresponsive to environment.      Data Reviewed:  There are no new results to review at this time.  Family Communication: Discussed patient's condition and plan of care with his wife [name] over the phone.  All questions and concerns have been addressed.  Disposition: Status is: Inpatient Remains inpatient appropriate because: Requires frequent suctioning from his trach  Planned Discharge Destination: Skilled nursing facility    Time spent: 30 minutes  Author: Collier Bullock, MD 05/19/2022 12:55 PM  For on call review [email or web address].

## 2022-05-19 NOTE — Progress Notes (Addendum)
Trach changed out for a new one of the same size, #6 [place] trach. Trach changed with no issues.

## 2022-05-20 DIAGNOSIS — J209 Acute bronchitis, unspecified: Secondary | ICD-10-CM | POA: Diagnosis not present

## 2022-05-20 LAB — CULTURE, RESPIRATORY W GRAM STAIN: Culture: NORMAL

## 2022-05-20 LAB — CBC
HCT: 33.6 % — ABNORMAL LOW (ref 39.0–52.0)
Hemoglobin: 10.6 g/dL — ABNORMAL LOW (ref 13.0–17.0)
MCH: 32.6 pg (ref 26.0–34.0)
MCHC: 31.5 g/dL (ref 30.0–36.0)
MCV: 103.4 fL — ABNORMAL HIGH (ref 80.0–100.0)
Platelets: 166 10*3/uL (ref 150–400)
RBC: 3.25 MIL/uL — ABNORMAL LOW (ref 4.22–5.81)
RDW: 14.3 % (ref 11.5–15.5)
WBC: 8.3 10*3/uL (ref 4.0–10.5)
nRBC: 0 % (ref 0.0–0.2)

## 2022-05-20 LAB — BASIC METABOLIC PANEL
Anion gap: 5 (ref 5–15)
BUN: 45 mg/dL — ABNORMAL HIGH (ref 6–20)
CO2: 30 mmol/L (ref 22–32)
Calcium: 9.1 mg/dL (ref 8.9–10.3)
Chloride: 103 mmol/L (ref 98–111)
Creatinine, Ser: 1.3 mg/dL — ABNORMAL HIGH (ref 0.61–1.24)
GFR, Estimated: >60 mL/min (ref >60)
Glucose, Bld: 119 mg/dL — ABNORMAL HIGH (ref 70–99)
Potassium: 4.6 mmol/L (ref 3.5–5.1)
Sodium: 138 mmol/L (ref 135–145)

## 2022-05-20 MED ORDER — METOCLOPRAMIDE HCL 5 MG/ML IJ SOLN
10.0000 mg | Freq: Three times a day (TID) | INTRAMUSCULAR | Status: AC
  Administered 2022-05-20 – 2022-05-21 (×3): 10 mg via INTRAVENOUS
  Filled 2022-05-20 (×3): qty 2

## 2022-05-20 MED ORDER — SODIUM CHLORIDE 0.9 % IV SOLN: INTRAVENOUS | Status: AC

## 2022-05-20 NOTE — Progress Notes (Signed)
Progress Note   Patient: Billy Cherry DOB: [birth year] DOA: 05/17/2022     2 DOS: the patient was seen and examined on 05/20/2022   Brief hospital course:  Billy Cherry is a 52 y.o. male with medical history significant of anoxic brain injury and persistent vegetative state status post tracheostomy and PEG tube placement, chronic systolic CHF, hypertension, DVT on chronic anticoagulation who presents to the ED with increased tracheal secretions and cough.  History obtained through chart review due to patient's anoxic brain injury and no family at bedside   Per chart, patient was brought into the ED due to increased tracheal secretions and cough.  Tracheal secretions were light yellow in color.  This began several days ago and family has been having trouble with suctioning.  No reported fever, chills.   ED Course:  On arrival to the ED, patient was normotensive at 122/95 with heart rate of 107.  He was saturating at 100% on home 8 L supplemental oxygen via tracheal cough.  He was afebrile at 99.9.  Initial workup notable for WBC of 12.7, hemoglobin of 11.7, MCV 1 5, potassium 4.7, BUN 64, creatinine 1.10, GFR above 60, AST 27, ALT 19.  Lactic acid elevated 2.0 with flat trend.  INR within normal limits at 1.2.  COVID-19, RSV and influenza negative. CT of the chest was obtained that demonstrated bilateral posterior and subsegmental lower lobe atelectasis, mild mosaic attenuation within the upper lung zones.  Patient was started on cefepime due to prior history of Pseudomonas.  TRH contacted for admission     Assessment and Plan:  * Acute tracheobronchitis Patient presented with several day history of increased secretions and cough, in addition to elevated WBC, tachycardia, tachypnea and low-grade temperature.  Patient met SIRS criteria, but no evidence of endorgan damage to suggest sepsis.  Given prior history of Pseudomonas, patient was initially placed on IV cefepime and then  switched to Zosyn which has been discontinued since sputum culture yielded normal respiratory flora. 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  No evidence of pneumonia or cavitary lesions on CT of the chest.  Patient continues to have increased secretions requiring frequent suctioning Continue scopolamine patch and Robinul Discontinue Robitussin       Acute on chronic respiratory failure with hypoxia ([name]) Patient is currently on 30% trach collar maintaining pulse oximetry greater than 92% Likely secondary to increased secretion and mucous plugging.   No pneumonia seen on CT of the chest, however notable for bilateral atelectasis. Continue supplemental oxygen to maintain oxygen saturation above 88% Trach was changed out to a #6 [place] Flex cuffed trach on 03/16      Chronic systolic CHF (congestive heart failure) ([place]) On examination, patient appears euvolemic.   Hold home diuretics and resume when able Continue home GDMT     HTN (hypertension) Patient is on  carvedilol, Imdur and hydralazine   Pulmonary embolus ([place]) - Continue home Eliquis   Anoxic brain injury ([place]) - Home medications continued, including Klonopin, Keppra, and valproic acid - Will hold tube feeds due to persistent emesis -Switch Reglan to 10 mg IV every 8            Subjective: Patient is seen and examined at the bedside.  Per nursing has had several episodes of emesis with suctioning.  Lurline Idol was changed out for a new one on 03/17  Physical Exam: Vitals:   05/19/22 2006 05/19/22 2352 05/20/22 0730 05/20/22 0801  BP:  105/72  (!) 120/91  Pulse:  (!) 106  (!) 108  Resp:  19  (!) 24  Temp:  98.5 F (36.9 C)  98 F (36.7 C)  TempSrc:    Oral  SpO2: 98% 97% 94% 97%  Weight:      Height:      Vitals and nursing note reviewed.  Constitutional:      Comments: Patient is nonverbal, opens at eyes to touch  HENT:     Head: Normocephalic and atraumatic.   Pulmonary:     Effort: Tachypnea present.     Breath sounds: Rhonchi (Throughout) present. No decreased breath sounds, wheezing or rales.     Comments: Thick yellow secretions seen coming from tracheal tube Abdominal:     General: Bowel sounds are normal. There is no distension.     Palpations: Abdomen is soft.  Musculoskeletal:     Right lower leg: No edema.     Left lower leg: No edema.  Skin:    General: Skin is warm and dry.  Neurological:     Comments:  Patient appears at baseline, which is nonverbal and generally unresponsive to environment.   Data Reviewed: Labs reviewed.  Noted to have worsening renal function from baseline. 1.05 >> 1.30 There are no new results to review at this time.  Family Communication: Attempted to call patient's wife and give an update.  Left a voicemail  Disposition: Status is: Inpatient Remains inpatient appropriate because: Unable to tolerate tube feeds.  Gentle IV fluid hydration  Planned Discharge Destination: Home    Time spent: 35 minutes  Author: Collier Bullock, MD 05/20/2022 10:47 AM  For on call review [email or web address].

## 2022-05-21 DIAGNOSIS — J209 Acute bronchitis, unspecified: Secondary | ICD-10-CM | POA: Diagnosis not present

## 2022-05-21 LAB — BASIC METABOLIC PANEL
Anion gap: 8 (ref 5–15)
BUN: 42 mg/dL — ABNORMAL HIGH (ref 6–20)
CO2: 31 mmol/L (ref 22–32)
Calcium: 9.2 mg/dL (ref 8.9–10.3)
Chloride: 100 mmol/L (ref 98–111)
Creatinine, Ser: 1.27 mg/dL — ABNORMAL HIGH (ref 0.61–1.24)
GFR, Estimated: >60 mL/min (ref >60)
Glucose, Bld: 85 mg/dL (ref 70–99)
Potassium: 4.1 mmol/L (ref 3.5–5.1)
Sodium: 139 mmol/L (ref 135–145)

## 2022-05-21 MED ORDER — FREE WATER
100.0000 mL | Status: DC
  Administered 2022-05-21 – 2022-05-23 (×12): 100 mL

## 2022-05-21 MED ORDER — METOCLOPRAMIDE HCL 5 MG/5ML PO SOLN
10.0000 mg | Freq: Four times a day (QID) | ORAL | Status: DC
  Administered 2022-05-21 – 2022-05-23 (×8): 10 mg
  Filled 2022-05-21 (×9): qty 10

## 2022-05-21 NOTE — Progress Notes (Signed)
Progress Note   Patient: Billy Cherry [id number] DOB: [birth year] DOA: 05/17/2022     3 DOS: the patient was seen and examined on 05/21/2022   Brief hospital course:  Esiquio Pidcock is a 52 y.o. male with medical history significant of anoxic brain injury and persistent vegetative state status post tracheostomy and PEG tube placement, chronic systolic CHF, hypertension, DVT on chronic anticoagulation who presents to the ED with increased tracheal secretions and cough.  History obtained through chart review due to patient's anoxic brain injury and no family at bedside   Per chart, patient was brought into the ED due to increased tracheal secretions and cough.  Tracheal secretions were light yellow in color.  This began several days ago and family has been having trouble with suctioning.  No reported fever, chills.   ED Course:  On arrival to the ED, patient was normotensive at 122/95 with heart rate of 107.  He was saturating at 100% on home 8 L supplemental oxygen via tracheal cough.  He was afebrile at 99.9.  Initial workup notable for WBC of 12.7, hemoglobin of 11.7, MCV 1 5, potassium 4.7, BUN 64, creatinine 1.10, GFR above 60, AST 27, ALT 19.  Lactic acid elevated 2.0 with flat trend.  INR within normal limits at 1.2.  COVID-19, RSV and influenza negative. CT of the chest was obtained that demonstrated bilateral posterior and subsegmental lower lobe atelectasis, mild mosaic attenuation within the upper lung zones.  Patient was started on cefepime due to prior history of Pseudomonas.  TRH contacted for admission      Assessment and Plan:  * Acute tracheobronchitis Patient presented with several day history of increased secretions and cough, in addition to elevated WBC, tachycardia, tachypnea and low-grade temperature.  Patient met SIRS criteria, but no evidence of endorgan damage to suggest sepsis.  Given prior history of Pseudomonas, patient was initially placed on IV cefepime and then  switched to Zosyn which has been discontinued since sputum culture yielded normal respiratory flora. 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  No evidence of pneumonia or cavitary lesions on CT of the chest.  Patient continues to have increased secretions requiring frequent suctioning Continue scopolamine patch and Robinul Discontinue Robitussin       Acute on chronic respiratory failure with hypoxia ([name]) Patient is currently on 30% trach collar maintaining pulse oximetry greater than 92% Likely secondary to increased secretion and mucous plugging.   Stable No pneumonia seen on CT of the chest, however notable for bilateral atelectasis. Continue supplemental oxygen to maintain oxygen saturation above 88% Trach was changed out to a #6 [place] Flex cuffed trach on 03/16       Chronic systolic CHF (congestive heart failure) ([place]) On examination, patient appears euvolemic.   Hold home diuretics and resume when able Continue home GDMT     HTN (hypertension) Patient is on  carvedilol, Imdur and hydralazine   Pulmonary embolus ([place]) - Continue home Eliquis   Anoxic brain injury ([place]) - Home medications continued, including Klonopin, Keppra, and valproic acid - No further episodes of emesis.  Tube feeds resumed at 20 cc to increase by 10 cc every 8 hours as tolerated -Continue Reglan  -Increase free water flushes via PEG         Subjective: No further emesis.  Continues to require frequent suctioning  Physical Exam: Vitals:   05/21/22 0321 05/21/22 0755 05/21/22 0850 05/21/22 1114  BP: 106/74  117/81 (!) 145/119  Pulse: 83  87 94  Resp: 15  16 18   Temp: 99 F (37.2 C)  98.4 F (36.9 C) 98.4 F (36.9 C)  TempSrc:   Oral Oral  SpO2: 95% 95% 99% 99%  Weight:   102.7 kg   Height:       Vitals and nursing note reviewed.  Constitutional:      Comments: Patient is nonverbal, opens at eyes to touch  HENT:     Head: Normocephalic and  atraumatic.  Pulmonary:     Effort: Normal    Breath sounds: Air entry in both lung.  No  wheezing or rales.     Comments: Thick yellow secretions seen coming from tracheal tube Abdominal:     General: Bowel sounds are normal. There is no distension.     Palpations: Abdomen is soft.  Musculoskeletal:     Right lower leg: No edema.     Left lower leg: No edema.  Skin:    General: Skin is warm and dry.  Neurological:     Comments:  Patient appears at baseline, which is nonverbal and generally unresponsive to environment.     Data Reviewed: Labs reviewed. Elevated BUN and creatinine There are no new results to review at this time.  Family Communication:   Disposition: Status is: Inpatient Remains inpatient appropriate because: Make sure patient tolerates tube feeds prior to discharge  Planned Discharge Destination: Home    Time spent: 33  minutes  Author: Collier Bullock, MD 05/21/2022 11:59 AM  For on call review [email or web address].

## 2022-05-22 DIAGNOSIS — J209 Acute bronchitis, unspecified: Secondary | ICD-10-CM | POA: Diagnosis not present

## 2022-05-22 LAB — CULTURE, BLOOD (ROUTINE X 2)
Culture: NO GROWTH
Culture: NO GROWTH

## 2022-05-22 NOTE — Discharge Summary (Signed)
Physician Discharge Summary   Patient: Billy Cherry MRN: [id number] DOB: 1970-05-24  Admit date:     05/17/2022  Discharge date: 05/22/22  Discharge Physician: Tylia Ewell   PCP: [facility]   Recommendations at discharge:   Suction as needed Hold Robitussin for now   Discharge Diagnoses: Principal Problem:   Acute tracheobronchitis Active Problems:   Acute on chronic respiratory failure with hypoxia ([name])   Chronic systolic CHF (congestive heart failure) ([name])   HTN (hypertension)   Anoxic brain injury ([place])   Pulmonary embolus ([name])   Acute tracheitis  Resolved Problems:   * No resolved hospital problems. [facility] Course: Billy Cherry is a 52 y.o. male with medical history significant of anoxic brain injury and persistent vegetative state status post tracheostomy and PEG tube placement, chronic systolic CHF, hypertension, DVT on chronic anticoagulation who presents to the ED with increased tracheal secretions and cough.  History obtained through chart review due to patient's anoxic brain injury and no family at bedside   Per chart, patient was brought into the ED due to increased tracheal secretions and cough.  Tracheal secretions were light yellow in color.  This began several days ago and family has been having trouble with suctioning.  No reported fever, chills.   ED Course:  On arrival to the ED, patient was normotensive at 122/95 with heart rate of 107.  He was saturating at 100% on home 8 L supplemental oxygen via tracheal cough.  He was afebrile at 99.9.  Initial workup notable for WBC of 12.7, hemoglobin of 11.7, MCV 1 5, potassium 4.7, BUN 64, creatinine 1.10, GFR above 60, AST 27, ALT 19.  Lactic acid elevated 2.0 with flat trend.  INR within normal limits at 1.2.  COVID-19, RSV and influenza negative. CT of the chest was obtained that demonstrated bilateral posterior and subsegmental lower lobe atelectasis, mild mosaic attenuation within the  upper lung zones.  Patient was started on cefepime due to prior history of Pseudomonas.  TRH contacted for admission.    Assessment and Plan:  * Acute tracheobronchitis Patient presented with several day history of increased secretions and cough, in addition to elevated WBC, tachycardia, tachypnea and low-grade temperature.  Patient met SIRS criteria, but no evidence of endorgan damage to suggest sepsis.  Given prior history of Pseudomonas, patient was initially placed on IV cefepime and then switched to Zosyn which has been discontinued since sputum culture yielded normal respiratory flora. 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  No evidence of pneumonia or cavitary lesions on CT of the chest.  Patient continues to have increased secretions requiring frequent suctioning Continue scopolamine patch and Robinul Discontinue Robitussin       Acute on chronic respiratory failure with hypoxia ([name]) Patient is currently on 30% trach collar maintaining pulse oximetry greater than 92% Likely secondary to increased secretion and mucous plugging.   Stable No pneumonia seen on CT of the chest, however notable for bilateral atelectasis. Continue supplemental oxygen to maintain oxygen saturation above 88% Trach was changed out to a #6 Shiley Flex cuffed trach on 03/16       Chronic systolic CHF (congestive heart failure) ([place]) On examination, patient appears euvolemic.   Hold home diuretics and resume when able Continue home GDMT     HTN (hypertension) Patient is on  carvedilol, Imdur and hydralazine   Pulmonary embolus ([place]) - Continue home Eliquis   Anoxic brain injury ([place]) - Home  medications continued, including Klonopin, Keppra, and valproic acid - No further episodes of emesis.  Tube feeds resumed at 20 cc to increase by 10 cc every 8 hours as tolerated.  Currently at 50 cc an hour and tolerating feeds.  Discussed with wife who states that his goal  weight is 60 cc/hr -Continue Reglan  -Continue free water flushes via PEG            Consultants: None Procedures performed: Trach changed to a #6 Shiley Flex cuffed trach  Disposition: Home health Diet recommendation:  NPO   DISCHARGE MEDICATION: Allergies as of 05/22/2022       Reactions   Zestril [lisinopril] Swelling   angioedema        Medication List     STOP taking these medications    amoxicillin-clavulanate 250-62.5 MG/5ML suspension Commonly known as: Augmentin   guaiFENesin 100 MG/5ML liquid Commonly known as: ROBITUSSIN   oxyCODONE 5 MG/5ML solution Commonly known as: ROXICODONE       TAKE these medications    acetaminophen 325 MG tablet Commonly known as: TYLENOL Take 650 mg by mouth every 6 (six) hours as needed.   albuterol (2.5 MG/3ML) 0.083% nebulizer solution Commonly known as: PROVENTIL Take 3 mLs (2.5 mg total) by nebulization every 6 (six) hours as needed for wheezing or shortness of breath.   apixaban 5 MG Tabs tablet Commonly known as: ELIQUIS Place 1 tablet (5 mg total) into feeding tube 2 (two) times daily.   atropine 1 % ophthalmic solution Place 2 drops under the tongue 4 (four) times daily. What changed: additional instructions   carvedilol 6.25 MG tablet Commonly known as: COREG Place 1 tablet (6.25 mg total) into feeding tube 2 (two) times daily with a meal.   chlorhexidine gluconate (MEDLINE KIT) 0.12 % solution Commonly known as: PERIDEX 15 mLs by Mouth Rinse route 2 (two) times daily.   clonazePAM 0.5 MG tablet Commonly known as: KLONOPIN PLACE 1 TABLET (0.5 MG TOTAL) INTO FEEDING TUBE 2 (TWO) TIMES DAILY.   liver oil-zinc oxide 40 % ointment Commonly known as: DESITIN Apply topically. Apply 1 application. topically as needed for dry skin.   Desitin 40 % Pste Generic drug: Zinc Oxide Apply 1 Application topically in the morning and at bedtime.   doxazosin 2 MG tablet Commonly known as: CARDURA PLACE 1  TABLET (2 MG TOTAL) INTO FEEDING TUBE DAILY. What changed: when to take this   famotidine 20 MG tablet Commonly known as: PEPCID PLACE 1 TABLET (20 MG TOTAL) INTO FEEDING TUBE 2 (TWO) TIMES DAILY.   feeding supplement (OSMOLITE 1.5 CAL) Liqd Place 1,000 mLs into feeding tube continuous.   fiber Pack packet Place 1 packet into feeding tube 2 (two) times daily.   fluticasone 50 MCG/ACT nasal spray Commonly known as: FLONASE Place 2 sprays into both nostrils daily.   free water Soln Place 200 mLs into feeding tube every 6 (six) hours.   GaviLAX 17 GM/SCOOP powder Generic drug: polyethylene glycol powder Place 1 capful (17 g) into feeding tube daily as needed for mild constipation or moderate constipation.   glycopyrrolate 1 MG tablet Commonly known as: ROBINUL Place 1 tablet (1 mg total) into feeding tube 3 (three) times daily. What changed: additional instructions   hydrALAZINE 50 MG tablet Commonly known as: APRESOLINE Place 1 tablet (50 mg total) into feeding tube 3 (three) times daily. What changed: additional instructions   ipratropium 0.02 % nebulizer solution Commonly known as: ATROVENT Inhale 0.25 mg into  the lungs every 6 (six) hours as needed. 0800/1400/2000/0200   ipratropium-albuterol 0.5-2.5 (3) MG/3ML Soln Commonly known as: DUONEB TAKE 3 ML BY NEBULIZATION EVERY 4 HOUR AS NEED (WHEEZING, SHORTNESS OF BREATH). USE FIRST BEFORE ALBUTEROL   isosorbide dinitrate 30 MG tablet Commonly known as: ISORDIL Place 1 tablet (30 mg total) into feeding tube 3 (three) times daily.   levETIRAcetam 100 MG/ML solution Commonly known as: KEPPRA PLACE 7.5 MLS (750 MG TOTAL) INTO FEEDING TUBE 2 (TWO) TIMES DAILY.   Loperamide HCl 1 MG/7.5ML Liqd Place 15 mLs (2 mg total) into feeding tube daily as needed for diarrhea or loose stools. What changed: when to take this   metoCLOPramide 5 MG/5ML solution Commonly known as: REGLAN Take 10 mg by mouth every 6 (six) hours.  0200/0800/1400/2000   mouth rinse Liqd solution 15 mLs by Mouth Rinse route every 4 (four) hours.   Nebulizer System All-In-One Misc 1 Inhalation by Does not apply route every hour as needed.   ondansetron 4 MG tablet Commonly known as: Zofran Place 1 tablet (4 mg total) into feeding tube every 8 (eight) hours as needed for nausea or vomiting. Crush finely and place through G tube, followed by a flush   scopolamine 1 MG/3DAYS Commonly known as: TRANSDERM-SCOP PLACE 1 PATCH ONTO THE SKIN EVERY 3 DAYS.   spironolactone 25 MG tablet Commonly known as: ALDACTONE Place 1 tablet (25 mg total) into feeding tube daily.   valproic acid 250 MG/5ML solution Commonly known as: DEPAKENE Place 15 mLs (750 mg total) into feeding tube 3 (three) times daily.        Discharge Exam: Filed Weights   05/20/22 0801 05/21/22 0850 05/22/22 0805  Weight: 103.3 kg 102.7 kg 103.5 kg   Vitals and nursing note reviewed.  Constitutional:      Comments: Patient is nonverbal, opens at eyes to touch  HENT:     Head: Normocephalic and atraumatic.  Pulmonary:     Effort: Normal    Breath sounds: Air entry in both lung.  No  wheezing or rales.     Comments: Thick yellow secretions seen coming from tracheal tube Abdominal:     General: Bowel sounds are normal. There is no distension.     Palpations: Abdomen is soft.  Musculoskeletal:     Right lower leg: No edema.     Left lower leg: No edema.  Skin:    General: Skin is warm and dry.  Neurological:     Comments:  Patient appears at baseline, which is nonverbal and generally unresponsive to environment.     Condition at discharge: stable  The results of significant diagnostics from this hospitalization (including imaging, microbiology, ancillary and laboratory) are listed below for reference.   Imaging Studies: CT CHEST WO CONTRAST  Result Date: 05/17/2022 CLINICAL DATA:  Respiratory illness. Increased secretions and cough. EXAM: CT CHEST  WITHOUT CONTRAST TECHNIQUE: Multidetector CT imaging of the chest was performed following the standard protocol without IV contrast. RADIATION DOSE REDUCTION: This exam was performed according to the departmental dose-optimization program which includes automated exposure control, adjustment of the mA and/or kV according to patient size and/or use of iterative reconstruction technique. COMPARISON:  08/24/2021 FINDINGS: Cardiovascular: Mild cardiac enlargement. Ascending thoracic aorta measures 4 cm in diameter. Aortic atherosclerosis. Mediastinum/Nodes: Tracheostomy tube is in place with tip above the carina. Unremarkable appearance of the esophagus and thyroid gland. No mediastinal or axillary adenopathy. Lungs/Pleura: No pleural fluid or airspace consolidation. Mild mosaic attenuation within the  upper lung zones. No signs of pneumothorax. Bilateral posterior segmental and subsegmental lower lobe atelectasis and volume loss is identified. Upper Abdomen: No acute abnormality. Upper pole right kidney cyst noted. No follow-up imaging recommended. Musculoskeletal: No chest wall mass or suspicious bone lesions identified. IMPRESSION: 1. Bilateral posterior segmental and subsegmental lower lobe atelectasis and volume loss. 2. Mild mosaic attenuation within the upper lung zones which may reflect underlying small airways disease. 3. Ascending thoracic aorta measures 4 cm in diameter. Recommend annual imaging followup by CTA or MRA. This recommendation follows 2010 ACCF/AHA/AATS/ACR/ASA/SCA/SCAI/SIR/STS/SVM Guidelines for the Diagnosis and Management of Patients with Thoracic Aortic Disease. Circulation. 2010; [id number]:[id number]. Aortic aneurysm NOS (ICD10-I71.9) 4.  Aortic Atherosclerosis (ICD10-I70.0). Electronically Signed   By: Kerby Moors M.D.   On: 05/17/2022 12:54   DG Chest Port 1 View  Result Date: 05/17/2022 CLINICAL DATA:  Evaluate for sepsis. Increased tracheal secretions and cough. EXAM: PORTABLE CHEST 1  VIEW COMPARISON:  01/18/2022 FINDINGS: Tracheostomy tube tip is 3.7 cm above the carina. Stable cardiac enlargement. Lung volumes are low. No pleural effusion, interstitial edema or airspace disease. IMPRESSION: 1. Low lung volumes. 2. Stable cardiac enlargement. Electronically Signed   By: Kerby Moors M.D.   On: 05/17/2022 11:32    Microbiology: Results for orders placed or performed during the hospital encounter of 05/17/22  Resp panel by RT-PCR (RSV, Flu A&B, Covid) Anterior Nasal Swab     Status: None   Collection Time: 05/17/22 11:34 AM   Specimen: Anterior Nasal Swab  Result Value Ref Range Status   SARS Coronavirus 2 by RT PCR NEGATIVE NEGATIVE Final    Comment: (NOTE) SARS-CoV-2 target nucleic acids are NOT DETECTED.  The SARS-CoV-2 RNA is generally detectable in upper respiratory specimens during the acute phase of infection. The lowest concentration of SARS-CoV-2 viral copies this assay can detect is 138 copies/mL. A negative result does not preclude SARS-Cov-2 infection and should not be used as the sole basis for treatment or other patient management decisions. A negative result may occur with  improper specimen collection/handling, submission of specimen other than nasopharyngeal swab, presence of viral mutation(s) within the areas targeted by this assay, and inadequate number of viral copies(<138 copies/mL). A negative result must be combined with clinical observations, patient history, and epidemiological information. The expected result is Negative.  Fact Sheet for Patients:  EntrepreneurPulse.com.au  Fact Sheet for Healthcare Providers:  IncredibleEmployment.be  This test is no t yet approved or cleared by the [place] FDA and  has been authorized for detection and/or diagnosis of SARS-CoV-2 by FDA under an Emergency Use Authorization (EUA). This EUA will remain  in effect (meaning this test can be used) for the duration of  the COVID-19 declaration under Section 564(b)(1) of the Act, 21 U.S.C.section 360bbb-3(b)(1), unless the authorization is terminated  or revoked sooner.       Influenza A by PCR NEGATIVE NEGATIVE Final   Influenza B by PCR NEGATIVE NEGATIVE Final    Comment: (NOTE) The Xpert Xpress SARS-CoV-2/FLU/RSV plus assay is intended as an aid in the diagnosis of influenza from Nasopharyngeal swab specimens and should not be used as a sole basis for treatment. Nasal washings and aspirates are unacceptable for Xpert Xpress SARS-CoV-2/FLU/RSV testing.  Fact Sheet for Patients: EntrepreneurPulse.com.au  Fact Sheet for Healthcare Providers: IncredibleEmployment.be  This test is not yet approved or cleared by the [place] FDA and has been authorized for detection and/or diagnosis of SARS-CoV-2 by FDA under an Emergency Use Authorization (  EUA). This EUA will remain in effect (meaning this test can be used) for the duration of the COVID-19 declaration under Section 564(b)(1) of the Act, 21 U.S.C. section 360bbb-3(b)(1), unless the authorization is terminated or revoked.     Resp Syncytial Virus by PCR NEGATIVE NEGATIVE Final    Comment: (NOTE) Fact Sheet for Patients: EntrepreneurPulse.com.au  Fact Sheet for Healthcare Providers: IncredibleEmployment.be  This test is not yet approved or cleared by the [place] FDA and has been authorized for detection and/or diagnosis of SARS-CoV-2 by FDA under an Emergency Use Authorization (EUA). This EUA will remain in effect (meaning this test can be used) for the duration of the COVID-19 declaration under Section 564(b)(1) of the Act, 21 U.S.C. section 360bbb-3(b)(1), unless the authorization is terminated or revoked.  Performed at [facility], [address] [place], [place] [address]   Blood Culture (routine x 2)     Status: None   Collection Time:  05/17/22 11:35 AM   Specimen: BLOOD  Result Value Ref Range Status   Specimen Description BLOOD RIGHT ANTECUBITAL  Final   Special Requests   Final    BOTTLES DRAWN AEROBIC AND ANAEROBIC Blood Culture results may not be optimal due to an excessive volume of blood received in culture bottles   Culture   Final    NO GROWTH 5 DAYS Performed at [facility], [address] [place], [place] [address]    Report Status 05/22/2022 FINAL  Final  Blood Culture (routine x 2)     Status: None   Collection Time: 05/17/22 11:50 AM   Specimen: BLOOD  Result Value Ref Range Status   Specimen Description BLOOD BLOOD LEFT FOREARM  Final   Special Requests   Final    BOTTLES DRAWN AEROBIC AND ANAEROBIC Blood Culture results may not be optimal due to an excessive volume of blood received in culture bottles   Culture   Final    NO GROWTH 5 DAYS Performed at [facility], [address], [place], [place] [address]    Report Status 05/22/2022 FINAL  Final  Culture, Respiratory w Gram Stain     Status: None   Collection Time: 05/17/22 12:20 PM   Specimen: Tracheal Aspirate; Respiratory  Result Value Ref Range Status   Specimen Description   Final    TRACHEAL ASPIRATE Performed at [facility], [address], [place], [place] [address]    Special Requests   Final    NONE Performed at [name], [address] [place], [place] [address]    Gram Stain   Final    FEW WBC PRESENT, PREDOMINANTLY PMN FEW GRAM POSITIVE COCCI FEW GRAM NEGATIVE RODS FEW GRAM POSITIVE RODS    Culture   Final    MODERATE Consistent with normal respiratory flora. Performed at [facility], [place] [address], [place], [place] [address]    Report Status 05/20/2022 FINAL  Final  MRSA Next Gen by PCR, Nasal     Status: None   Collection Time: 05/17/22  5:06 PM   Specimen: Nasal Mucosa; Nasal Swab  Result Value Ref Range Status   MRSA by PCR Next Gen NOT DETECTED NOT DETECTED  Final    Comment: (NOTE) The GeneXpert MRSA Assay (FDA approved for NASAL specimens only), is one component of a comprehensive MRSA colonization surveillance program. It is not intended to diagnose MRSA infection nor to guide or monitor treatment for MRSA infections. Test performance is not FDA approved  in patients less than 24 years old. Performed at [facility], [address] [place], [place] [address]     Labs: CBC: Recent Labs  Lab 05/17/22 1134 05/18/22 0403 05/20/22 0933  WBC 12.7* 10.2 8.3  NEUTROABS 8.5*  --   --   HGB 11.5* 11.1* 10.6*  HCT 37.7* 35.9* 33.6*  MCV 105.0* 104.1* 103.4*  PLT 189 167 [id number]   Basic Metabolic Panel: Recent Labs  Lab 05/17/22 1134 05/18/22 0403 05/19/22 0436 05/20/22 0933 05/21/22 0615  NA 140 140 138 138 139  K 4.7 4.7 4.8 4.6 4.1  CL 99 102 101 103 100  CO2 32 31 29 30 31   GLUCOSE 95 84 101* 119* 85  BUN 64* 48* 44* 45* 42*  CREATININE 1.10 1.04 1.05 1.30* 1.27*  CALCIUM 9.4 9.4 9.6 9.1 9.2  MG  --   --  2.2  --   --   PHOS  --   --  5.1*  --   --    Liver Function Tests: Recent Labs  Lab 05/17/22 1134  AST 27  ALT 19  ALKPHOS 60  BILITOT 0.6  PROT 7.4  ALBUMIN 3.1*   CBG: Recent Labs  Lab 05/17/22 1656  GLUCAP 107*    Discharge time spent: greater than 30 minutes.  Signed: Collier Bullock, MD [facility] 05/22/2022

## 2022-05-22 NOTE — TOC Progression Note (Signed)
Transition of Care [facility]) - Progression Note    Patient Details  Name: Billy Cherry MRN: [id number] Date of Birth: 1970/08/31  Transition of Care [facility]) CM/SW Contact  Laurena Slimmer, RN Phone Number: 05/22/2022, 8:59 PM  Clinical Narrative:    Patient discharge cancelled.      Barriers to Discharge: Barriers Resolved  Expected Discharge Plan and Services         Expected Discharge Date: 05/22/22                         HH Arranged: RN [place] Agency: [place] Date [facility] Agency Contacted: 05/22/22 Time [place]: 1321 Representative spoke with at [place]: [place] Determinants of Health ([place]) Interventions SDOH Screenings   Tobacco Use: Low Risk  (04/30/2022)    Readmission Risk Interventions    01/08/2022    3:18 PM 04/14/2021   11:14 AM  Readmission Risk Prevention Plan  Transportation Screening Complete Complete  PCP or Specialist Appt within 3-5 Days Complete   HRI or [place] Complete Complete  Social Work Consult for [place] Planning/Counseling Complete Complete  Palliative Care Screening Not Applicable Complete  Medication Review Press photographer) Complete Complete

## 2022-05-22 NOTE — TOC Transition Note (Addendum)
Transition of Care [facility]) - CM/SW Discharge Note   Patient Details  Name: Billy Cherry MRN: [id number] Date of Birth: Jun 15, 1970  Transition of Care [facility]) CM/SW Contact:  Laurena Slimmer, RN Phone Number: 05/22/2022, 1:23 PM   Clinical Narrative:    Billy Cherry with [name] from [place] at [phone]. Patient is already active for nursing services. Resumption order requested for [facility] RN.  Discharge summary faxed to [phone] per [name]'s request  Spoke with patient's wife to advised of discharge today.  She had concerns about patient's medications.  MD notified. [name] was stated she was at home an anticipating patient's arrival.  EMS arranged. She had also been advised HH will contat her directly for [facility] that per [name] would likely be today or tomorrow.  Nurse notified.  TOC signing off    Final next level of care: [place] Barriers to Discharge: Barriers Resolved   Patient Goals and CMS Choice      Discharge Placement                  Patient to be transferred to facility by: [facility] Name of family member notified: [name] Patient and family notified of of transfer: 05/22/22  Discharge Plan and Services Additional resources added to the After Visit Summary for                            [facility] Arranged: RN [facility] Agency: [place] Date [facility] Agency Contacted: 05/22/22 Time [place]: 1321 Representative spoke with at [place]: [place] Determinants of Health ([place]) Interventions SDOH Screenings   Tobacco Use: Low Risk  (04/30/2022)     Readmission Risk Interventions    01/08/2022    3:18 PM 04/14/2021   11:14 AM  Readmission Risk Prevention Plan  Transportation Screening Complete Complete  PCP or Specialist Appt within 3-5 Days Complete   HRI or [place] Complete Complete  Social Work Consult for [place] Planning/Counseling Complete Complete  Palliative Care Screening Not Applicable Complete   Medication Review Press photographer) Complete Complete

## 2022-05-23 DIAGNOSIS — J209 Acute bronchitis, unspecified: Secondary | ICD-10-CM | POA: Diagnosis not present

## 2022-05-23 NOTE — Progress Notes (Signed)
Patient discharge, EMS picked up, took patient on Tracheostomy collar, Suctioned properly before patient transfer, patient not in distress.

## 2022-05-23 NOTE — Discharge Summary (Signed)
Physician Discharge Summary   Patient: Billy Cherry MRN: [id number] DOB: Sep 29, 1970  Admit date:     05/17/2022  Discharge date: 05/23/22  Discharge Physician: Fritzi Mandes   PCP: [facility] [place] [facility]   Recommendations at discharge:   Suction as needed Hold Robitussin for now   Discharge Diagnoses: Principal Problem:   Acute tracheobronchitis Active Problems:   Acute on chronic respiratory failure with hypoxia ([name])   Chronic systolic CHF (congestive heart failure) ([name])   HTN (hypertension)   Anoxic brain injury ([place])   Pulmonary embolus ([place])   Acute tracheitis   Hospital Course: Billy Cherry is a 52 y.o. male with medical history significant of anoxic brain injury and persistent vegetative state status post tracheostomy and PEG tube placement, chronic systolic CHF, hypertension, DVT on chronic anticoagulation who presents to the ED with increased tracheal secretions and cough.  History obtained through chart review due to patient's anoxic brain injury and no family at bedside   Per chart, patient was brought into the ED due to increased tracheal secretions and cough.  Tracheal secretions were light yellow in color.  This began several days ago and family has been having trouble with suctioning.  No reported fever, chills.   Lactic acid elevated 2.0 with flat trend.     COVID-19, RSV and influenza negative.  CT of the chest was obtained that demonstrated bilateral posterior and subsegmental lower lobe atelectasis, mild mosaic attenuation within the upper lung zones.  Patient was started on cefepime due to prior history of Pseudomonas.    Assessment and Plan:  Acute tracheobronchitis -Patient presented with several day history of increased secretions and cough, in addition to elevated WBC, tachycardia, tachypnea and low-grade temperature.   -Patient met SIRS criteria, but no evidence of endorgan damage to suggest sepsis.  Given prior history of Pseudomonas, patient was  initially placed on IV cefepime and then switched to Zosyn which has been discontinued since sputum culture yielded normal respiratory flora. 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  No evidence of pneumonia or cavitary lesions on CT of the chest. --labs to be followed by PCP --cont suctioning --Continue scopolamine patch and Robinul     Acute on chronic respiratory failure with hypoxia ([place]) --Patient is currently on 30% trach collar maintaining pulse oximetry greater than 92% --Likely secondary to increased secretion and mucous plugging.   --Stable --Continue supplemental oxygen to maintain oxygen saturation above 88% --Lurline Idol was changed out to a #6 Shiley Flex cuffed trach on 03/16    Chronic systolic CHF (congestive heart failure) ([place]) --On examination, patient appears euvolemic.   --Continue home GDMT --not on diuretics at home    HTN (hypertension --Patient is on  carvedilol, Imdur and hydralazine   Pulmonary embolus ([place]) - Continue home Eliquis   Anoxic brain injury ([place]) - Home medications continued, including Klonopin, Keppra, and valproic acid - No further episodes of emesis.   --TF at goal rate -Continue Reglan  -Continue free water flushes via PEG   D/c home with [facility]          Consultants: None Procedures performed: Trach changed to a #6 Shiley Flex cuffed trach  Disposition: Home health Diet recommendation:  NPO   DISCHARGE MEDICATION: Allergies as of 05/23/2022       Reactions   Zestril [lisinopril] Swelling   angioedema        Medication List     STOP taking these medications    amoxicillin-clavulanate 250-62.5  MG/5ML suspension Commonly known as: Augmentin   guaiFENesin 100 MG/5ML liquid Commonly known as: ROBITUSSIN   oxyCODONE 5 MG/5ML solution Commonly known as: ROXICODONE       TAKE these medications    acetaminophen 325 MG tablet Commonly known as: TYLENOL Take 650 mg by mouth every 6  (six) hours as needed.   albuterol (2.5 MG/3ML) 0.083% nebulizer solution Commonly known as: PROVENTIL Take 3 mLs (2.5 mg total) by nebulization every 6 (six) hours as needed for wheezing or shortness of breath.   apixaban 5 MG Tabs tablet Commonly known as: ELIQUIS Place 1 tablet (5 mg total) into feeding tube 2 (two) times daily.   atropine 1 % ophthalmic solution Place 2 drops under the tongue 4 (four) times daily. What changed: additional instructions   carvedilol 6.25 MG tablet Commonly known as: COREG Place 1 tablet (6.25 mg total) into feeding tube 2 (two) times daily with a meal.   chlorhexidine gluconate (MEDLINE KIT) 0.12 % solution Commonly known as: PERIDEX 15 mLs by Mouth Rinse route 2 (two) times daily.   clonazePAM 0.5 MG tablet Commonly known as: KLONOPIN PLACE 1 TABLET (0.5 MG TOTAL) INTO FEEDING TUBE 2 (TWO) TIMES DAILY.   liver oil-zinc oxide 40 % ointment Commonly known as: DESITIN Apply topically. Apply 1 application. topically as needed for dry skin.   Desitin 40 % Pste Generic drug: Zinc Oxide Apply 1 Application topically in the morning and at bedtime.   doxazosin 2 MG tablet Commonly known as: CARDURA PLACE 1 TABLET (2 MG TOTAL) INTO FEEDING TUBE DAILY. What changed: when to take this   famotidine 20 MG tablet Commonly known as: PEPCID PLACE 1 TABLET (20 MG TOTAL) INTO FEEDING TUBE 2 (TWO) TIMES DAILY.   feeding supplement (OSMOLITE 1.5 CAL) Liqd Place 1,000 mLs into feeding tube continuous.   fiber Pack packet Place 1 packet into feeding tube 2 (two) times daily.   fluticasone 50 MCG/ACT nasal spray Commonly known as: FLONASE Place 2 sprays into both nostrils daily.   free water Soln Place 200 mLs into feeding tube every 6 (six) hours.   GaviLAX 17 GM/SCOOP powder Generic drug: polyethylene glycol powder Place 1 capful (17 g) into feeding tube daily as needed for mild constipation or moderate constipation.   glycopyrrolate 1 MG  tablet Commonly known as: ROBINUL Place 1 tablet (1 mg total) into feeding tube 3 (three) times daily. What changed: additional instructions   hydrALAZINE 50 MG tablet Commonly known as: APRESOLINE Place 1 tablet (50 mg total) into feeding tube 3 (three) times daily. What changed: additional instructions   ipratropium 0.02 % nebulizer solution Commonly known as: ATROVENT Inhale 0.25 mg into the lungs every 6 (six) hours as needed. 0800/1400/2000/0200   ipratropium-albuterol 0.5-2.5 (3) MG/3ML Soln Commonly known as: DUONEB TAKE 3 ML BY NEBULIZATION EVERY 4 HOUR AS NEED (WHEEZING, SHORTNESS OF BREATH). USE FIRST BEFORE ALBUTEROL   isosorbide dinitrate 30 MG tablet Commonly known as: ISORDIL Place 1 tablet (30 mg total) into feeding tube 3 (three) times daily.   levETIRAcetam 100 MG/ML solution Commonly known as: KEPPRA PLACE 7.5 MLS (750 MG TOTAL) INTO FEEDING TUBE 2 (TWO) TIMES DAILY.   Loperamide HCl 1 MG/7.5ML Liqd Place 15 mLs (2 mg total) into feeding tube daily as needed for diarrhea or loose stools. What changed: when to take this   metoCLOPramide 5 MG/5ML solution Commonly known as: REGLAN Take 10 mg by mouth every 6 (six) hours. 0200/0800/1400/2000   mouth rinse Liqd  solution 15 mLs by Mouth Rinse route every 4 (four) hours.   Nebulizer System All-In-One Misc 1 Inhalation by Does not apply route every hour as needed.   ondansetron 4 MG tablet Commonly known as: Zofran Place 1 tablet (4 mg total) into feeding tube every 8 (eight) hours as needed for nausea or vomiting. Crush finely and place through G tube, followed by a flush   scopolamine 1 MG/3DAYS Commonly known as: TRANSDERM-SCOP PLACE 1 PATCH ONTO THE SKIN EVERY 3 DAYS.   spironolactone 25 MG tablet Commonly known as: ALDACTONE Place 1 tablet (25 mg total) into feeding tube daily.   valproic acid 250 MG/5ML solution Commonly known as: DEPAKENE Place 15 mLs (750 mg total) into feeding tube 3 (three)  times daily.        Follow-up Information     [facility] [place]. Schedule an appointment as soon as possible for a visit.   Why: hospital f/u Contact information: [place] [address] [phone]                Discharge Exam: Filed Weights   05/20/22 0801 05/21/22 0850 05/22/22 0805  Weight: 103.3 kg 102.7 kg 103.5 kg   Vitals and nursing note reviewed.  Constitutional:      Comments: Patient is nonverbal, opens at eyes to touch  Pulmonary:       Breath sounds: Air entry in both lung.  No  wheezing or rales.     Comments: Thick yellow secretions seen coming from tracheal tube Abdominal:     General: Bowel sounds are normal. There is no distension.     PEG + Neurological:     Comments:  Patient appears at baseline, which is nonverbal and generally unresponsive to environment.     Condition at discharge: fair  The results of significant diagnostics from this hospitalization (including imaging, microbiology, ancillary and laboratory) are listed below for reference.   Imaging Studies: CT CHEST WO CONTRAST  Result Date: 05/17/2022 CLINICAL DATA:  Respiratory illness. Increased secretions and cough. EXAM: CT CHEST WITHOUT CONTRAST TECHNIQUE: Multidetector CT imaging of the chest was performed following the standard protocol without IV contrast. RADIATION DOSE REDUCTION: This exam was performed according to the departmental dose-optimization program which includes automated exposure control, adjustment of the mA and/or kV according to patient size and/or use of iterative reconstruction technique. COMPARISON:  08/24/2021 FINDINGS: Cardiovascular: Mild cardiac enlargement. Ascending thoracic aorta measures 4 cm in diameter. Aortic atherosclerosis. Mediastinum/Nodes: Tracheostomy tube is in place with tip above the carina. Unremarkable appearance of the esophagus and thyroid gland. No mediastinal or axillary adenopathy. Lungs/Pleura: No pleural  fluid or airspace consolidation. Mild mosaic attenuation within the upper lung zones. No signs of pneumothorax. Bilateral posterior segmental and subsegmental lower lobe atelectasis and volume loss is identified. Upper Abdomen: No acute abnormality. Upper pole right kidney cyst noted. No follow-up imaging recommended. Musculoskeletal: No chest wall mass or suspicious bone lesions identified. IMPRESSION: 1. Bilateral posterior segmental and subsegmental lower lobe atelectasis and volume loss. 2. Mild mosaic attenuation within the upper lung zones which may reflect underlying small airways disease. 3. Ascending thoracic aorta measures 4 cm in diameter. Recommend annual imaging followup by CTA or MRA. This recommendation follows 2010 ACCF/AHA/AATS/ACR/ASA/SCA/SCAI/SIR/STS/SVM Guidelines for the Diagnosis and Management of Patients with Thoracic Aortic Disease. Circulation. 2010; [id number]. Aortic aneurysm NOS (ICD10-I71.9) 4.  Aortic Atherosclerosis (ICD10-I70.0). Electronically Signed   By: Kerby Moors M.D.   On: 05/17/2022 12:54   DG  Chest Port 1 View  Result Date: 05/17/2022 CLINICAL DATA:  Evaluate for sepsis. Increased tracheal secretions and cough. EXAM: PORTABLE CHEST 1 VIEW COMPARISON:  01/18/2022 FINDINGS: Tracheostomy tube tip is 3.7 cm above the carina. Stable cardiac enlargement. Lung volumes are low. No pleural effusion, interstitial edema or airspace disease. IMPRESSION: 1. Low lung volumes. 2. Stable cardiac enlargement. Electronically Signed   By: Kerby Moors M.D.   On: 05/17/2022 11:32    Microbiology: Results for orders placed or performed during the hospital encounter of 05/17/22  Resp panel by RT-PCR (RSV, Flu A&B, Covid) Anterior Nasal Swab     Status: None   Collection Time: 05/17/22 11:34 AM   Specimen: Anterior Nasal Swab  Result Value Ref Range Status   SARS Coronavirus 2 by RT PCR NEGATIVE NEGATIVE Final    Comment: (NOTE) SARS-CoV-2 target nucleic acids are NOT  DETECTED.  The SARS-CoV-2 RNA is generally detectable in upper respiratory specimens during the acute phase of infection. The lowest concentration of SARS-CoV-2 viral copies this assay can detect is 138 copies/mL. A negative result does not preclude SARS-Cov-2 infection and should not be used as the sole basis for treatment or other patient management decisions. A negative result may occur with  improper specimen collection/handling, submission of specimen other than nasopharyngeal swab, presence of viral mutation(s) within the areas targeted by this assay, and inadequate number of viral copies(<138 copies/mL). A negative result must be combined with clinical observations, patient history, and epidemiological information. The expected result is Negative.  Fact Sheet for Patients:  EntrepreneurPulse.com.au  Fact Sheet for Healthcare Providers:  IncredibleEmployment.be  This test is no t yet approved or cleared by the [place] FDA and  has been authorized for detection and/or diagnosis of SARS-CoV-2 by FDA under an Emergency Use Authorization (EUA). This EUA will remain  in effect (meaning this test can be used) for the duration of the COVID-19 declaration under Section 564(b)(1) of the Act, 21 U.S.C.section 360bbb-3(b)(1), unless the authorization is terminated  or revoked sooner.       Influenza A by PCR NEGATIVE NEGATIVE Final   Influenza B by PCR NEGATIVE NEGATIVE Final    Comment: (NOTE) The Xpert Xpress SARS-CoV-2/FLU/RSV plus assay is intended as an aid in the diagnosis of influenza from Nasopharyngeal swab specimens and should not be used as a sole basis for treatment. Nasal washings and aspirates are unacceptable for Xpert Xpress SARS-CoV-2/FLU/RSV testing.  Fact Sheet for Patients: EntrepreneurPulse.com.au  Fact Sheet for Healthcare Providers: IncredibleEmployment.be  This test is not yet  approved or cleared by the [place] FDA and has been authorized for detection and/or diagnosis of SARS-CoV-2 by FDA under an Emergency Use Authorization (EUA). This EUA will remain in effect (meaning this test can be used) for the duration of the COVID-19 declaration under Section 564(b)(1) of the Act, 21 U.S.C. section 360bbb-3(b)(1), unless the authorization is terminated or revoked.     Resp Syncytial Virus by PCR NEGATIVE NEGATIVE Final    Comment: (NOTE) Fact Sheet for Patients: EntrepreneurPulse.com.au  Fact Sheet for Healthcare Providers: IncredibleEmployment.be  This test is not yet approved or cleared by the [place] FDA and has been authorized for detection and/or diagnosis of SARS-CoV-2 by FDA under an Emergency Use Authorization (EUA). This EUA will remain in effect (meaning this test can be used) for the duration of the COVID-19 declaration under Section 564(b)(1) of the Act, 21 U.S.C. section 360bbb-3(b)(1), unless the authorization is terminated or revoked.  Performed at [facility], [address] [place], [place] [address]   Blood Culture (routine x 2)     Status: None   Collection Time: 05/17/22 11:35 AM   Specimen: BLOOD  Result Value Ref Range Status   Specimen Description BLOOD RIGHT ANTECUBITAL  Final   Special Requests   Final    BOTTLES DRAWN AEROBIC AND ANAEROBIC Blood Culture results may not be optimal due to an excessive volume of blood received in culture bottles   Culture   Final    NO GROWTH 5 DAYS Performed at [facility], [address] [place], [place] [address]    Report Status 05/22/2022 FINAL  Final  Blood Culture (routine x 2)     Status: None   Collection Time: 05/17/22 11:50 AM   Specimen: BLOOD  Result Value Ref Range Status   Specimen Description BLOOD BLOOD LEFT FOREARM  Final   Special Requests   Final    BOTTLES DRAWN AEROBIC AND ANAEROBIC Blood Culture  results may not be optimal due to an excessive volume of blood received in culture bottles   Culture   Final    NO GROWTH 5 DAYS Performed at [facility], [address], [place], [place] [address]    Report Status 05/22/2022 FINAL  Final  Culture, Respiratory w Gram Stain     Status: None   Collection Time: 05/17/22 12:20 PM   Specimen: Tracheal Aspirate; Respiratory  Result Value Ref Range Status   Specimen Description   Final    TRACHEAL ASPIRATE Performed at [facility], [address], [place], [place] [address]    Special Requests   Final    NONE Performed at [facility], [address] [place], [place] [address]    Gram Stain   Final    FEW WBC PRESENT, PREDOMINANTLY PMN FEW GRAM POSITIVE COCCI FEW GRAM NEGATIVE RODS FEW GRAM POSITIVE RODS    Culture   Final    MODERATE Consistent with normal respiratory flora. Performed at [facility], [place] [address] [place], [place] [address]    Report Status 05/20/2022 FINAL  Final  MRSA Next Gen by PCR, Nasal     Status: None   Collection Time: 05/17/22  5:06 PM   Specimen: Nasal Mucosa; Nasal Swab  Result Value Ref Range Status   MRSA by PCR Next Gen NOT DETECTED NOT DETECTED Final    Comment: (NOTE) The GeneXpert MRSA Assay (FDA approved for NASAL specimens only), is one component of a comprehensive MRSA colonization surveillance program. It is not intended to diagnose MRSA infection nor to guide or monitor treatment for MRSA infections. Test performance is not FDA approved in patients less than 36 years old. Performed at [facility], [address] [place], [place] [address]     Labs: CBC: Recent Labs  Lab 05/17/22 1134 05/18/22 0403 05/20/22 0933  WBC 12.7* 10.2 8.3  NEUTROABS 8.5*  --   --   HGB 11.5* 11.1* 10.6*  HCT 37.7* 35.9* 33.6*  MCV 105.0* 104.1* 103.4*  PLT 189 167 [id number]    Basic Metabolic Panel: Recent Labs  Lab 05/17/22 1134 05/18/22 0403  05/19/22 0436 05/20/22 0933 05/21/22 0615  NA 140 140 138 138 139  K 4.7 4.7 4.8 4.6 4.1  CL 99 102 101 103 100  CO2 32 31 29 30 31   GLUCOSE 95 84 101* 119* 85  BUN 64* 48* 44* 45* 42*  CREATININE 1.10 1.04 1.05 1.30* 1.27*  CALCIUM 9.4 9.4 9.6 9.1 9.2  MG  --   --  2.2  --   --   PHOS  --   --  5.1*  --   --     Liver Function Tests: Recent Labs  Lab 05/17/22 1134  AST 27  ALT 19  ALKPHOS 60  BILITOT 0.6  PROT 7.4  ALBUMIN 3.1*    CBG: Recent Labs  Lab 05/17/22 1656  GLUCAP 107*     Discharge time spent: greater than 30 minutes.  Signed: Fritzi Mandes, MD [facility] 05/23/2022

## 2022-05-23 NOTE — TOC Transition Note (Signed)
Transition of Care [facility]) - CM/SW Discharge Note   Patient Details  Name: Billy Cherry MRN: [id number] Date of Birth: [birth year]  [facility] CM/SW Contact:  Laurena Slimmer, RN Phone Number: 05/23/2022, 10:00 AM   Clinical Narrative:    Spoke with patient's wife regarding discharge today She is at home and expecting patient EMS arranged  Face sheet and medical necessity forms printed to the floor. TOC signing off    Final next level of care: [place] Barriers to Discharge: Barriers Resolved   Patient Goals and CMS Choice      Discharge Placement                  Patient to be transferred to facility by: [facility] Name of family member notified: [name] Patient and family notified of of transfer: 05/23/22  Discharge Plan and Services Additional resources added to the After Visit Summary for                            [facility] Arranged: RN [facility] Agency: [place] Date [facility] Agency Contacted: 05/23/22 Time [place]: [phone] Representative spoke with at [place]: [place] Determinants of Health ([place]) Interventions SDOH Screenings   Tobacco Use: Low Risk  (04/30/2022)     Readmission Risk Interventions    01/08/2022    3:18 PM 04/14/2021   11:14 AM  Readmission Risk Prevention Plan  Transportation Screening Complete Complete  PCP or Specialist Appt within 3-5 Days Complete   HRI or [place] Complete Complete  Social Work Consult for [place] Planning/Counseling Complete Complete  Palliative Care Screening Not Applicable Complete  Medication Review Press photographer) Complete Complete

## 2022-05-24 LAB — ACID FAST SMEAR (AFB, MYCOBACTERIA): Acid Fast Smear: NEGATIVE

## 2022-05-25 LAB — ACID FAST SMEAR (AFB, MYCOBACTERIA): Acid Fast Smear: NEGATIVE

## 2022-05-28 ENCOUNTER — Telehealth (INDEPENDENT_AMBULATORY_CARE_PROVIDER_SITE_OTHER): Payer: BC Managed Care – PPO | Admitting: Family Medicine

## 2022-05-28 DIAGNOSIS — G40909 Epilepsy, unspecified, not intractable, without status epilepticus: Secondary | ICD-10-CM

## 2022-05-28 NOTE — Progress Notes (Unsigned)
   Established Patient Office Visit  Subjective   Patient ID: Billy Cherry, male    DOB: 11-09-1970  Age: 52 y.o. MRN: [id number]  No chief complaint on file.   HPI  {History (Optional):23778}  ROS    Objective:     There were no vitals taken for this visit. {Vitals History (Optional):23777}  Physical Exam   No results found for any visits on 04/10/22.  {Labs (Optional):23779}  The ASCVD Risk score (Arnett DK, et al., 2019) failed to calculate for the following reasons:   The valid total cholesterol range is 130 to 320 mg/dL    Assessment & Plan:   Problem List Items Addressed This Visit   None Visit Diagnoses     Community acquired pneumonia, unspecified laterality           No follow-ups on file.    Elwin Mocha, MD

## 2022-05-29 MED ORDER — CLONAZEPAM 0.5 MG PO TABS: 0.5000 mg | ORAL_TABLET | Freq: Two times a day (BID) | ORAL | 0 refills | Status: AC | PRN

## 2022-05-29 NOTE — Telephone Encounter (Signed)
PDMP reviewed during this encounter.  Med refilled.  Elwin Mocha, MD

## 2022-05-30 ENCOUNTER — Inpatient Hospital Stay
Admission: EM | Admit: 2022-05-30 | Discharge: 2022-06-05 | DRG: 202 | Disposition: A | Discharge status: Home Health | Payer: BC Managed Care – PPO | Attending: Internal Medicine | Admitting: Internal Medicine

## 2022-05-30 ENCOUNTER — Emergency Department: Payer: BC Managed Care – PPO

## 2022-05-30 ENCOUNTER — Encounter: Payer: Self-pay | Admitting: Family Medicine

## 2022-05-30 ENCOUNTER — Other Ambulatory Visit: Payer: Self-pay

## 2022-05-30 DIAGNOSIS — R0602 Shortness of breath: Secondary | ICD-10-CM

## 2022-05-30 DIAGNOSIS — F419 Anxiety disorder, unspecified: Secondary | ICD-10-CM | POA: Diagnosis present

## 2022-05-30 DIAGNOSIS — W44F9XA Other object of natural or organic material, entering into or through a natural orifice, initial encounter: Secondary | ICD-10-CM | POA: Diagnosis present

## 2022-05-30 DIAGNOSIS — Z930 Tracheostomy status: Secondary | ICD-10-CM

## 2022-05-30 DIAGNOSIS — B965 Pseudomonas (aeruginosa) (mallei) (pseudomallei) as the cause of diseases classified elsewhere: Secondary | ICD-10-CM | POA: Diagnosis present

## 2022-05-30 DIAGNOSIS — Z86718 Personal history of other venous thrombosis and embolism: Secondary | ICD-10-CM

## 2022-05-30 DIAGNOSIS — Z7901 Long term (current) use of anticoagulants: Secondary | ICD-10-CM | POA: Diagnosis not present

## 2022-05-30 DIAGNOSIS — I110 Hypertensive heart disease with heart failure: Secondary | ICD-10-CM | POA: Diagnosis present

## 2022-05-30 DIAGNOSIS — I10 Essential (primary) hypertension: Secondary | ICD-10-CM | POA: Diagnosis not present

## 2022-05-30 DIAGNOSIS — I5022 Chronic systolic (congestive) heart failure: Secondary | ICD-10-CM | POA: Diagnosis present

## 2022-05-30 DIAGNOSIS — Z931 Gastrostomy status: Secondary | ICD-10-CM | POA: Diagnosis not present

## 2022-05-30 DIAGNOSIS — T17490A Other foreign object in trachea causing asphyxiation, initial encounter: Secondary | ICD-10-CM | POA: Diagnosis present

## 2022-05-30 DIAGNOSIS — R403 Persistent vegetative state: Secondary | ICD-10-CM | POA: Diagnosis present

## 2022-05-30 DIAGNOSIS — J9621 Acute and chronic respiratory failure with hypoxia: Secondary | ICD-10-CM | POA: Diagnosis not present

## 2022-05-30 DIAGNOSIS — Z1152 Encounter for screening for COVID-19: Secondary | ICD-10-CM

## 2022-05-30 DIAGNOSIS — G40909 Epilepsy, unspecified, not intractable, without status epilepticus: Secondary | ICD-10-CM | POA: Diagnosis not present

## 2022-05-30 DIAGNOSIS — Z79899 Other long term (current) drug therapy: Secondary | ICD-10-CM

## 2022-05-30 DIAGNOSIS — J209 Acute bronchitis, unspecified: Principal | ICD-10-CM | POA: Diagnosis present

## 2022-05-30 DIAGNOSIS — D649 Anemia, unspecified: Secondary | ICD-10-CM | POA: Diagnosis present

## 2022-05-30 DIAGNOSIS — R1110 Vomiting, unspecified: Secondary | ICD-10-CM | POA: Diagnosis not present

## 2022-05-30 DIAGNOSIS — J0410 Acute tracheitis without obstruction: Principal | ICD-10-CM

## 2022-05-30 DIAGNOSIS — G931 Anoxic brain damage, not elsewhere classified: Secondary | ICD-10-CM | POA: Diagnosis present

## 2022-05-30 DIAGNOSIS — K219 Gastro-esophageal reflux disease without esophagitis: Secondary | ICD-10-CM | POA: Insufficient documentation

## 2022-05-30 LAB — BASIC METABOLIC PANEL
Anion gap: 9 (ref 5–15)
BUN: 42 mg/dL — ABNORMAL HIGH (ref 6–20)
CO2: 31 mmol/L (ref 22–32)
Calcium: 9.7 mg/dL (ref 8.9–10.3)
Chloride: 98 mmol/L (ref 98–111)
Creatinine, Ser: 1.18 mg/dL (ref 0.61–1.24)
GFR, Estimated: >60 mL/min (ref >60)
Glucose, Bld: 139 mg/dL — ABNORMAL HIGH (ref 70–99)
Potassium: 5 mmol/L (ref 3.5–5.1)
Sodium: 138 mmol/L (ref 135–145)

## 2022-05-30 LAB — CBC WITH DIFFERENTIAL/PLATELET
Abs Immature Granulocytes: 0.06 10*3/uL (ref 0.00–0.07)
Basophils Absolute: 0 10*3/uL (ref 0.0–0.1)
Basophils Relative: 0 %
Eosinophils Absolute: 0.3 10*3/uL (ref 0.0–0.5)
Eosinophils Relative: 3 %
HCT: 35.9 % — ABNORMAL LOW (ref 39.0–52.0)
Hemoglobin: 10.9 g/dL — ABNORMAL LOW (ref 13.0–17.0)
Immature Granulocytes: 1 %
Lymphocytes Relative: 15 %
Lymphs Abs: 1.7 10*3/uL (ref 0.7–4.0)
MCH: 32 pg (ref 26.0–34.0)
MCHC: 30.4 g/dL (ref 30.0–36.0)
MCV: 105.3 fL — ABNORMAL HIGH (ref 80.0–100.0)
Monocytes Absolute: 1.7 10*3/uL — ABNORMAL HIGH (ref 0.1–1.0)
Monocytes Relative: 15 %
Neutro Abs: 7.3 10*3/uL (ref 1.7–7.7)
Neutrophils Relative %: 66 %
Platelets: 208 10*3/uL (ref 150–400)
RBC: 3.41 MIL/uL — ABNORMAL LOW (ref 4.22–5.81)
RDW: 14.1 % (ref 11.5–15.5)
WBC: 11.2 10*3/uL — ABNORMAL HIGH (ref 4.0–10.5)
nRBC: 0 % (ref 0.0–0.2)

## 2022-05-30 LAB — LACTIC ACID, PLASMA: Lactic Acid, Venous: 1.3 mmol/L (ref 0.5–1.9)

## 2022-05-30 LAB — RESP PANEL BY RT-PCR (RSV, FLU A&B, COVID)  RVPGX2
Influenza A by PCR: NEGATIVE
Influenza B by PCR: NEGATIVE
Resp Syncytial Virus by PCR: NEGATIVE
SARS Coronavirus 2 by RT PCR: NEGATIVE

## 2022-05-30 LAB — PROCALCITONIN: Procalcitonin: <0.1 ng/mL

## 2022-05-30 LAB — BRAIN NATRIURETIC PEPTIDE: B Natriuretic Peptide: 33 pg/mL (ref 0.0–100.0)

## 2022-05-30 MED ORDER — GLYCOPYRROLATE 1 MG PO TABS
1.0000 mg | ORAL_TABLET | Freq: Three times a day (TID) | ORAL | Status: DC
  Administered 2022-05-30 – 2022-06-05 (×18): 1 mg
  Filled 2022-05-30 (×21): qty 1

## 2022-05-30 MED ORDER — HYDRALAZINE HCL 50 MG PO TABS
50.0000 mg | ORAL_TABLET | Freq: Three times a day (TID) | ORAL | Status: DC
  Administered 2022-05-30 – 2022-06-05 (×18): 50 mg
  Filled 2022-05-30 (×20): qty 1

## 2022-05-30 MED ORDER — CHLORHEXIDINE GLUCONATE 0.12% ORAL RINSE (MEDLINE KIT)
15.0000 mL | Freq: Two times a day (BID) | OROMUCOSAL | Status: DC
  Administered 2022-05-30 – 2022-06-05 (×10): 15 mL via OROMUCOSAL
  Filled 2022-05-30: qty 15

## 2022-05-30 MED ORDER — METOCLOPRAMIDE HCL 10 MG/10ML PO SOLN
10.0000 mg | Freq: Four times a day (QID) | ORAL | Status: DC
  Administered 2022-05-30 – 2022-06-02 (×13): 10 mg
  Filled 2022-05-30 (×16): qty 10

## 2022-05-30 MED ORDER — METOCLOPRAMIDE HCL 10 MG/10ML PO SOLN
10.0000 mg | Freq: Four times a day (QID) | ORAL | Status: DC
  Filled 2022-05-30 (×2): qty 10

## 2022-05-30 MED ORDER — ACETAMINOPHEN 325 MG RE SUPP: 650.0000 mg | Freq: Four times a day (QID) | RECTAL | Status: DC | PRN

## 2022-05-30 MED ORDER — SODIUM CHLORIDE 0.9 % IV SOLN: INTRAVENOUS | Status: DC

## 2022-05-30 MED ORDER — SPIRONOLACTONE 25 MG PO TABS
25.0000 mg | ORAL_TABLET | Freq: Every day | ORAL | Status: DC
  Administered 2022-05-30 – 2022-06-05 (×7): 25 mg
  Filled 2022-05-30 (×7): qty 1

## 2022-05-30 MED ORDER — LEVETIRACETAM 100 MG/ML PO SOLN
750.0000 mg | Freq: Two times a day (BID) | ORAL | Status: DC
  Administered 2022-05-30 – 2022-06-05 (×13): 750 mg
  Filled 2022-05-30 (×13): qty 7.5

## 2022-05-30 MED ORDER — METOPROLOL TARTRATE 5 MG/5ML IV SOLN: 5.0000 mg | INTRAVENOUS | Status: DC | PRN

## 2022-05-30 MED ORDER — OSMOLITE 1.5 CAL PO LIQD: 1000.0000 mL | ORAL | Status: DC

## 2022-05-30 MED ORDER — OSMOLITE 1.5 CAL PO LIQD
1000.0000 mL | ORAL | Status: DC
  Administered 2022-05-30: 1000 mL

## 2022-05-30 MED ORDER — DOXAZOSIN MESYLATE 2 MG PO TABS
2.0000 mg | ORAL_TABLET | Freq: Every day | ORAL | Status: DC
  Administered 2022-05-30 – 2022-06-04 (×6): 2 mg
  Filled 2022-05-30 (×6): qty 1

## 2022-05-30 MED ORDER — TRAZODONE HCL 50 MG PO TABS: 50.0000 mg | ORAL_TABLET | Freq: Every evening | ORAL | Status: DC | PRN

## 2022-05-30 MED ORDER — ACETAMINOPHEN 325 MG PO TABS: 650.0000 mg | ORAL_TABLET | Freq: Four times a day (QID) | ORAL | Status: DC | PRN

## 2022-05-30 MED ORDER — ALBUTEROL SULFATE (2.5 MG/3ML) 0.083% IN NEBU: 2.5000 mg | INHALATION_SOLUTION | Freq: Four times a day (QID) | RESPIRATORY_TRACT | Status: DC | PRN

## 2022-05-30 MED ORDER — ONDANSETRON HCL 4 MG/2ML IJ SOLN
4.0000 mg | Freq: Once | INTRAMUSCULAR | Status: AC
  Administered 2022-05-30: 4 mg via INTRAVENOUS
  Filled 2022-05-30: qty 2

## 2022-05-30 MED ORDER — POLYETHYLENE GLYCOL 3350 17 G PO PACK: 17.0000 g | PACK | Freq: Every day | ORAL | Status: DC | PRN

## 2022-05-30 MED ORDER — LIDOCAINE HCL (PF) 1 % IJ SOLN
5.0000 mL | Freq: Once | INTRAMUSCULAR | Status: AC
  Administered 2022-05-30: 5 mL
  Filled 2022-05-30: qty 5

## 2022-05-30 MED ORDER — TRAZODONE HCL 50 MG PO TABS: 25.0000 mg | ORAL_TABLET | Freq: Every evening | ORAL | Status: DC | PRN

## 2022-05-30 MED ORDER — FLUTICASONE PROPIONATE 50 MCG/ACT NA SUSP
2.0000 | Freq: Every day | NASAL | Status: DC
  Administered 2022-06-05: 2 via NASAL
  Filled 2022-05-30: qty 16

## 2022-05-30 MED ORDER — ZINC OXIDE 40 % EX OINT
1.0000 | TOPICAL_OINTMENT | Freq: Two times a day (BID) | CUTANEOUS | Status: DC
  Administered 2022-05-30 – 2022-06-05 (×13): 1 via TOPICAL
  Filled 2022-05-30: qty 113

## 2022-05-30 MED ORDER — CARVEDILOL 6.25 MG PO TABS
6.2500 mg | ORAL_TABLET | Freq: Two times a day (BID) | ORAL | Status: DC
  Administered 2022-05-30 – 2022-06-05 (×13): 6.25 mg
  Filled 2022-05-30 (×13): qty 1

## 2022-05-30 MED ORDER — ORAL CARE MOUTH RINSE
15.0000 mL | OROMUCOSAL | Status: DC
  Administered 2022-05-30 – 2022-06-05 (×36): 15 mL via OROMUCOSAL
  Filled 2022-05-30 (×3): qty 15

## 2022-05-30 MED ORDER — IPRATROPIUM-ALBUTEROL 0.5-2.5 (3) MG/3ML IN SOLN: 3.0000 mL | RESPIRATORY_TRACT | Status: DC | PRN

## 2022-05-30 MED ORDER — HYDRALAZINE HCL 20 MG/ML IJ SOLN: 10.0000 mg | INTRAMUSCULAR | Status: DC | PRN

## 2022-05-30 MED ORDER — GUAIFENESIN 100 MG/5ML PO LIQD: 15.0000 mL | Freq: Four times a day (QID) | ORAL | Status: DC

## 2022-05-30 MED ORDER — POLYVINYL ALCOHOL 1.4 % OP SOLN: 1.0000 [drp] | OPHTHALMIC | Status: DC | PRN

## 2022-05-30 MED ORDER — NUTRISOURCE FIBER PO PACK
1.0000 | PACK | Freq: Two times a day (BID) | ORAL | Status: DC
  Filled 2022-05-30: qty 1

## 2022-05-30 MED ORDER — PROSOURCE TF20 ENFIT COMPATIBL EN LIQD
60.0000 mL | Freq: Every day | ENTERAL | Status: DC
  Administered 2022-05-31 – 2022-06-05 (×6): 60 mL
  Filled 2022-05-30: qty 60

## 2022-05-30 MED ORDER — SODIUM CHLORIDE 0.9 % IV SOLN
2.0000 g | Freq: Once | INTRAVENOUS | Status: AC
  Administered 2022-05-30: 2 g via INTRAVENOUS
  Filled 2022-05-30: qty 12.5

## 2022-05-30 MED ORDER — IPRATROPIUM-ALBUTEROL 0.5-2.5 (3) MG/3ML IN SOLN
3.0000 mL | Freq: Once | RESPIRATORY_TRACT | Status: AC
  Administered 2022-05-30: 3 mL via RESPIRATORY_TRACT
  Filled 2022-05-30: qty 3

## 2022-05-30 MED ORDER — ONDANSETRON HCL 4 MG PO TABS
4.0000 mg | ORAL_TABLET | Freq: Four times a day (QID) | ORAL | Status: DC | PRN
  Administered 2022-06-03: 4 mg
  Filled 2022-05-30: qty 1

## 2022-05-30 MED ORDER — CLONAZEPAM 0.5 MG PO TABS: 0.5000 mg | ORAL_TABLET | Freq: Two times a day (BID) | ORAL | Status: DC | PRN

## 2022-05-30 MED ORDER — HYDROCOD POLI-CHLORPHE POLI ER 10-8 MG/5ML PO SUER
5.0000 mL | Freq: Two times a day (BID) | ORAL | Status: DC | PRN
  Administered 2022-05-31 – 2022-06-02 (×3): 5 mL
  Filled 2022-05-30 (×3): qty 5

## 2022-05-30 MED ORDER — SENNOSIDES-DOCUSATE SODIUM 8.6-50 MG PO TABS: 1.0000 | ORAL_TABLET | Freq: Every evening | ORAL | Status: DC | PRN

## 2022-05-30 MED ORDER — HYDROCORTISONE 1 % EX CREA: 1.0000 | TOPICAL_CREAM | Freq: Three times a day (TID) | CUTANEOUS | Status: DC | PRN

## 2022-05-30 MED ORDER — MUSCLE RUB 10-15 % EX CREA: 1.0000 | TOPICAL_CREAM | CUTANEOUS | Status: DC | PRN

## 2022-05-30 MED ORDER — SCOPOLAMINE 1 MG/3DAYS TD PT72
1.0000 | MEDICATED_PATCH | TRANSDERMAL | Status: DC
  Administered 2022-05-30 – 2022-06-05 (×3): 1.5 mg via TRANSDERMAL
  Filled 2022-05-30 (×3): qty 1

## 2022-05-30 MED ORDER — FREE WATER
30.0000 mL | Status: DC
  Administered 2022-05-30 – 2022-06-05 (×34): 30 mL

## 2022-05-30 MED ORDER — ACETAMINOPHEN 325 MG PO TABS
650.0000 mg | ORAL_TABLET | Freq: Four times a day (QID) | ORAL | Status: DC | PRN
  Administered 2022-05-31: 650 mg
  Filled 2022-05-30: qty 2

## 2022-05-30 MED ORDER — ISOSORBIDE DINITRATE 30 MG PO TABS
30.0000 mg | ORAL_TABLET | Freq: Three times a day (TID) | ORAL | Status: DC
  Administered 2022-05-30 – 2022-06-05 (×17): 30 mg
  Filled 2022-05-30 (×20): qty 1

## 2022-05-30 MED ORDER — GUAIFENESIN ER 600 MG PO TB12: 600.0000 mg | ORAL_TABLET | Freq: Two times a day (BID) | ORAL | Status: DC

## 2022-05-30 MED ORDER — LORATADINE 10 MG PO TABS: 10.0000 mg | ORAL_TABLET | Freq: Every day | ORAL | Status: DC | PRN

## 2022-05-30 MED ORDER — ONDANSETRON HCL 4 MG PO TABS: 4.0000 mg | ORAL_TABLET | Freq: Three times a day (TID) | ORAL | Status: DC | PRN

## 2022-05-30 MED ORDER — IPRATROPIUM-ALBUTEROL 0.5-2.5 (3) MG/3ML IN SOLN: 3.0000 mL | Freq: Four times a day (QID) | RESPIRATORY_TRACT | Status: DC

## 2022-05-30 MED ORDER — ATROPINE SULFATE 1 % OP SOLN
2.0000 [drp] | Freq: Four times a day (QID) | OPHTHALMIC | Status: DC
  Administered 2022-05-30 – 2022-06-05 (×24): 2 [drp] via SUBLINGUAL
  Filled 2022-05-30 (×2): qty 2

## 2022-05-30 MED ORDER — MAGNESIUM HYDROXIDE 400 MG/5ML PO SUSP: 30.0000 mL | Freq: Every day | ORAL | Status: DC | PRN

## 2022-05-30 MED ORDER — ONDANSETRON HCL 4 MG/2ML IJ SOLN: 4.0000 mg | Freq: Four times a day (QID) | INTRAMUSCULAR | Status: DC | PRN

## 2022-05-30 MED ORDER — HYDROCORTISONE (PERIANAL) 2.5 % EX CREA: 1.0000 | TOPICAL_CREAM | Freq: Four times a day (QID) | CUTANEOUS | Status: DC | PRN

## 2022-05-30 MED ORDER — GUAIFENESIN 100 MG/5ML PO LIQD
15.0000 mL | Freq: Four times a day (QID) | ORAL | Status: DC
  Administered 2022-05-30 – 2022-06-05 (×23): 15 mL
  Filled 2022-05-30 (×22): qty 20

## 2022-05-30 MED ORDER — VALPROIC ACID 250 MG/5ML PO SOLN
750.0000 mg | Freq: Three times a day (TID) | ORAL | Status: DC
  Administered 2022-05-30 – 2022-06-05 (×17): 750 mg
  Filled 2022-05-30 (×20): qty 15

## 2022-05-30 MED ORDER — APIXABAN 5 MG PO TABS
5.0000 mg | ORAL_TABLET | Freq: Two times a day (BID) | ORAL | Status: DC
  Administered 2022-05-30 – 2022-06-05 (×13): 5 mg
  Filled 2022-05-30 (×13): qty 1

## 2022-05-30 MED ORDER — BLISTEX MEDICATED EX OINT: 1.0000 | TOPICAL_OINTMENT | CUTANEOUS | Status: DC | PRN

## 2022-05-30 MED ORDER — GUAIFENESIN-CODEINE 100-10 MG/5ML PO SOLN
10.0000 mL | Freq: Once | ORAL | Status: AC
  Administered 2022-05-30: 10 mL
  Filled 2022-05-30: qty 10

## 2022-05-30 MED ORDER — ACETAMINOPHEN 650 MG RE SUPP: 650.0000 mg | Freq: Four times a day (QID) | RECTAL | Status: DC | PRN

## 2022-05-30 MED ORDER — ALUM & MAG HYDROXIDE-SIMETH 200-200-20 MG/5ML PO SUSP: 30.0000 mL | ORAL | Status: DC | PRN

## 2022-05-30 MED ORDER — LOPERAMIDE HCL 1 MG/7.5ML PO SOLN: 2.0000 mg | Freq: Four times a day (QID) | ORAL | Status: DC | PRN

## 2022-05-30 MED ORDER — OSMOLITE 1.5 CAL PO LIQD
1000.0000 mL | ORAL | Status: DC
  Administered 2022-05-30 – 2022-05-31 (×2): 1000 mL

## 2022-05-30 MED ORDER — SODIUM CHLORIDE 0.9 % IV SOLN
500.0000 mg | INTRAVENOUS | Status: AC
  Administered 2022-05-30 – 2022-06-03 (×5): 500 mg via INTRAVENOUS
  Filled 2022-05-30 (×5): qty 5

## 2022-05-30 MED ORDER — SODIUM CHLORIDE 0.9 % IV SOLN
2.0000 g | INTRAVENOUS | Status: DC
  Administered 2022-05-30 – 2022-05-31 (×2): 2 g via INTRAVENOUS
  Filled 2022-05-30 (×2): qty 20
  Filled 2022-05-30: qty 2

## 2022-05-30 MED ORDER — IPRATROPIUM-ALBUTEROL 0.5-2.5 (3) MG/3ML IN SOLN
3.0000 mL | Freq: Four times a day (QID) | RESPIRATORY_TRACT | Status: DC
  Administered 2022-05-30: 3 mL via RESPIRATORY_TRACT
  Filled 2022-05-30: qty 3

## 2022-05-30 MED ORDER — SALINE SPRAY 0.65 % NA SOLN: 1.0000 | NASAL | Status: DC | PRN

## 2022-05-30 MED ORDER — FAMOTIDINE 20 MG PO TABS
20.0000 mg | ORAL_TABLET | Freq: Two times a day (BID) | ORAL | Status: DC
  Administered 2022-05-30 – 2022-06-05 (×13): 20 mg
  Filled 2022-05-30 (×13): qty 1

## 2022-05-30 MED ORDER — IPRATROPIUM-ALBUTEROL 0.5-2.5 (3) MG/3ML IN SOLN
3.0000 mL | Freq: Four times a day (QID) | RESPIRATORY_TRACT | Status: DC
  Administered 2022-05-30 – 2022-06-01 (×9): 3 mL via RESPIRATORY_TRACT
  Filled 2022-05-30 (×9): qty 3

## 2022-05-30 MED ORDER — PHENOL 1.4 % MT LIQD: 1.0000 | OROMUCOSAL | Status: DC | PRN

## 2022-05-30 NOTE — ED Notes (Signed)
Pt suctioned at this time with suction catheter d/t new hypoxia.  Following suctioning, pt started coughing up thick secretions, which were cleared with [place].  Pt no longer hypoxic following interventions.

## 2022-05-30 NOTE — Assessment & Plan Note (Signed)
-   The patient be admitted to a medical telemetry bed. - We will continue him on hydration with IV normal saline. - We will continue bronchodilator therapy with DuoNebs 4 times daily and every 4 hours as needed as well as mucolytic therapy. - We will continue IV antibiotic therapy with Rocephin and Zithromax given significant amount of purulent sputum that was suctioned.

## 2022-05-30 NOTE — Assessment & Plan Note (Signed)
-   We will continue PPI therapy 

## 2022-05-30 NOTE — Assessment & Plan Note (Signed)
-   We will continue his antiseizure medications.

## 2022-05-30 NOTE — Progress Notes (Signed)
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  Chest x-ray was overall unremarkable.  Procalcitonin, BNP negative.  COVID/flu/RSV were negative.   Assessment & Plan:  Principal Problem:   Acute tracheobronchitis Active Problems:   Acute on chronic respiratory failure with hypoxia ([name])   Essential hypertension   Seizure disorder ([name])   Anxiety   GERD without esophagitis    Assessment and Plan: * Acute tracheobronchitis - BNP, procalcitonin, flu, RSV, COVID-19-negative.  Will check respiratory panel.  Currently on bronchodilators scheduled and as needed.  Will send tracheal cultures.  On empiric IV Rocephin and azithromycin.  Stop IV fluids, start tube feeds  Acute on chronic respiratory failure with hypoxia ([name]) History of congestive CHF with EF 30% - Is secondary to #1 and possibly due to mucous plug. - O2 protocol will be followed. - Management otherwise as above.  Essential hypertension - We will continue his antihypertensives.  Seizure disorder ([place]) - Keppra   GERD without esophagitis - We will continue PPI therapy  Anxiety - We will continue his Klonopin.   DVT prophylaxis: Eliquis Code Status: Full  Family Communication:  Spouse at bedside.   Status is: Inpatient Remains inpatient appropriate because: Cont hospital stay    Subjective: Remains unresponsive. Coughing with clear secretion.    Examination:  General exam: At baseline he is unresponsive 9L [place] Respiratory system: Mild bilateral rhonchi Cardiovascular system: S1 & S2 heard, RRR. No JVD, murmurs, rubs, gallops or clicks. No pedal edema. Gastrointestinal system: Abdomen is  nondistended, soft and nontender. No organomegaly or masses felt. Normal bowel sounds heard. Central nervous system: Unable to assess, at baseline is unresponsive Extremities: Slightly contracted Skin: No rashes, lesions or ulcers Psychiatry: Unable to assess  Objective: Vitals:   05/30/22 0700 05/30/22 0723 05/30/22 0815 05/30/22 0829  BP: 126/83     Pulse: 97   92  Resp: (!) 25   19  Temp:   98.3 F (36.8 C)   TempSrc:   Axillary   SpO2: 95%   96%  Weight:  104.6 kg    Height:  6' (1.829 m)     No intake or output data in the 24 hours ending 05/30/22 0901 Filed Weights   05/30/22 0723  Weight: 104.6 kg     Data Reviewed:   CBC: Recent Labs  Lab 05/30/22 0258  WBC 11.2*  NEUTROABS 7.3  HGB 10.9*  HCT 35.9*  MCV 105.3*  PLT 123XX123   Basic Metabolic Panel: Recent Labs  Lab 05/30/22 0258  NA 138  K 5.0  CL 98  CO2 31  GLUCOSE 139*  BUN 42*  CREATININE 1.18  CALCIUM 9.7   GFR: Estimated Creatinine Clearance: 92.6 mL/min (by C-G formula based on SCr of 1.18 mg/dL). Liver Function Tests: No results for input(s): "AST", "ALT", "ALKPHOS", "BILITOT", "PROT", "ALBUMIN" in the last 168 hours. No results for input(s): "LIPASE", "AMYLASE" in the last 168 hours. No results for input(s): "AMMONIA" in the last 168 hours. Coagulation Profile: No results for input(s): "INR", "PROTIME" in the last 168 hours. Cardiac Enzymes: No results for input(s): "CKTOTAL", "CKMB", "CKMBINDEX", "TROPONINI" in the last 168 hours. BNP (last 3 results) No results for input(s): "PROBNP" in the last 8760  hours. HbA1C: No results for input(s): "HGBA1C" in the last 72 hours. CBG: No results for input(s): "GLUCAP" in the last 168 hours. Lipid Profile: No results for input(s): "CHOL", "HDL", "LDLCALC", "TRIG", "CHOLHDL", "LDLDIRECT" in the last 72 hours. Thyroid Function Tests: No results for input(s): "TSH", "T4TOTAL", "FREET4", "T3FREE", "THYROIDAB" in the last 72 hours. Anemia  Panel: No results for input(s): "VITAMINB12", "FOLATE", "FERRITIN", "TIBC", "IRON", "RETICCTPCT" in the last 72 hours. Sepsis Labs: Recent Labs  Lab 05/30/22 0258 05/30/22 0428  PROCALCITON <0.10  --   LATICACIDVEN  --  1.3    Recent Results (from the past 240 hour(s))  Resp panel by RT-PCR (RSV, Flu A&B, Covid) Anterior Nasal Swab     Status: None   Collection Time: 05/30/22  2:58 AM   Specimen: Anterior Nasal Swab  Result Value Ref Range Status   SARS Coronavirus 2 by RT PCR NEGATIVE NEGATIVE Final    Comment: (NOTE) SARS-CoV-2 target nucleic acids are NOT DETECTED.  The SARS-CoV-2 RNA is generally detectable in upper respiratory specimens during the acute phase of infection. The lowest concentration of SARS-CoV-2 viral copies this assay can detect is 138 copies/mL. A negative result does not preclude SARS-Cov-2 infection and should not be used as the sole basis for treatment or other patient management decisions. A negative result may occur with  improper specimen collection/handling, submission of specimen other than nasopharyngeal swab, presence of viral mutation(s) within the areas targeted by this assay, and inadequate number of viral copies(<138 copies/mL). A negative result must be combined with clinical observations, patient history, and epidemiological information. The expected result is Negative.  Fact Sheet for Patients:  EntrepreneurPulse.com.au  Fact Sheet for Healthcare Providers:  IncredibleEmployment.be  This test is no t yet approved or cleared by the [place] FDA and  has been authorized for detection and/or diagnosis of SARS-CoV-2 by FDA under an Emergency Use Authorization (EUA). This EUA will remain  in effect (meaning this test can be used) for the duration of the COVID-19 declaration under Section 564(b)(1) of the Act, 21 U.S.C.section 360bbb-3(b)(1), unless the authorization is terminated  or revoked  sooner.       Influenza A by PCR NEGATIVE NEGATIVE Final   Influenza B by PCR NEGATIVE NEGATIVE Final    Comment: (NOTE) The Xpert Xpress SARS-CoV-2/FLU/RSV plus assay is intended as an aid in the diagnosis of influenza from Nasopharyngeal swab specimens and should not be used as a sole basis for treatment. Nasal washings and aspirates are unacceptable for Xpert Xpress SARS-CoV-2/FLU/RSV testing.  Fact Sheet for Patients: EntrepreneurPulse.com.au  Fact Sheet for Healthcare Providers: IncredibleEmployment.be  This test is not yet approved or cleared by the [place] FDA and has been authorized for detection and/or diagnosis of SARS-CoV-2 by FDA under an Emergency Use Authorization (EUA). This EUA will remain in effect (meaning this test can be used) for the duration of the COVID-19 declaration under Section 564(b)(1) of the Act, 21 U.S.C. section 360bbb-3(b)(1), unless the authorization is terminated or revoked.     Resp Syncytial Virus by PCR NEGATIVE NEGATIVE Final    Comment: (NOTE) Fact Sheet for Patients: EntrepreneurPulse.com.au  Fact Sheet for Healthcare Providers: IncredibleEmployment.be  This test is not yet approved or cleared by the [place] FDA and has been authorized for detection and/or diagnosis of SARS-CoV-2 by FDA under an Emergency Use Authorization (EUA). This EUA will remain in effect (meaning this test can be used) for the duration of the COVID-19 declaration under Section 564(b)(1) of the  Act, 21 U.S.C. section 360bbb-3(b)(1), unless the authorization is terminated or revoked.  Performed at [facility], [address] [place], [place] [address]   Blood culture (routine x 2)     Status: None (Preliminary result)   Collection Time: 05/30/22  4:28 AM   Specimen: BLOOD  Result Value Ref Range Status   Specimen Description BLOOD BLOOD RIGHT ARM  Final    Special Requests   Final    BOTTLES DRAWN AEROBIC AND ANAEROBIC Blood Culture results may not be optimal due to an excessive volume of blood received in culture bottles   Culture   Final    NO GROWTH < 12 HOURS Performed at [facility], [address] [place], [place] [address]    Report Status PENDING  Incomplete         Radiology Studies: DG Chest Portable 1 View  Result Date: 05/30/2022 CLINICAL DATA:  Shortness of breath EXAM: PORTABLE CHEST 1 VIEW COMPARISON:  None Available. FINDINGS: The heart size and mediastinal contours are within normal limits. Both lungs are clear. The visualized skeletal structures are unremarkable. Tracheostomy tube tip is at the level of the clavicular heads. IMPRESSION: No active disease. Electronically Signed   By: Ulyses Jarred M.D.   On: 05/30/2022 02:51        Scheduled Meds:  apixaban  5 mg Per Tube BID   atropine  2 drop Sublingual QID   carvedilol  6.25 mg Per Tube BID WC   chlorhexidine gluconate (MEDLINE KIT)  15 mL Mouth Rinse BID   doxazosin  2 mg Per Tube QHS   famotidine  20 mg Per Tube BID   fiber  1 packet Per Tube BID   fluticasone  2 spray Each Nare Daily   glycopyrrolate  1 mg Per Tube TID   guaiFENesin  15 mL Per Tube Q6H   hydrALAZINE  50 mg Per Tube TID   ipratropium-albuterol  3 mL Nebulization Q6H   isosorbide dinitrate  30 mg Per Tube TID   levETIRAcetam  750 mg Per Tube BID   liver oil-zinc oxide  1 Application Topical BID   metoCLOPramide  10 mg Per Tube Q6H   mouth rinse  15 mL Mouth Rinse Q4H   scopolamine  1 patch Transdermal Q72H   spironolactone  25 mg Per Tube Daily   valproic acid  750 mg Per Tube TID   Continuous Infusions:  sodium chloride 100 mL/hr at 05/30/22 0812   azithromycin Stopped (05/30/22 0719)   cefTRIAXone (ROCEPHIN)  IV     feeding supplement (OSMOLITE 1.5 CAL)       LOS: 0 days   Time spent= 35 mins    Marnita Poirier Arsenio Loader, MD [facility]  If 7PM-7AM, please  contact night-coverage  05/30/2022, 9:01 AM

## 2022-05-30 NOTE — Progress Notes (Signed)
Initial Nutrition Assessment  DOCUMENTATION CODES:   Not applicable  INTERVENTION:   Osmolite 1.5@65ml /hr + ProSource TF 20- Give 63ml daily via tube  Free water flushes 95ml q4 hours to maintain tube patency   Regimen provides 2420kcal/day, 118g/day protein and 1368ml/day of free water.   Daily weights   Billy Cherry at low refeed risk  NUTRITION DIAGNOSIS:   Inadequate oral intake related to dysphagia (Billy Cherry with chronic G-tube) as evidenced by NPO status.  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  Visited Billy Cherry's room today. Billy Cherry is unable to provide any nutrition related history r/t ABI. Billy Cherry is well known to the nutrition department from previous admissions. Billy Cherry NPO with chronic G-tube. Wife reports Billy Cherry receives Osmolite 1.5@60ml /hr at home. Wife reports that patient tolerates tube feeds well but requires miralax daily at home. Wife also reports that patient has intermittent episodes of vomiting at home that were directly r/t coughing and gagging with suctioning. Wife does give tussin at home, along with reglan 10mg  QID, robinul and scopolamine. Billy Cherry currently receiving tube feeds at 60ml/hr. RD will increase tube feeds to goal rate. Billy Cherry is likely at low refeed risk. Per chart, Billy Cherry appears weight stable since his last admission.   Medications reviewed and include: pepcid, glycopyrrolate, reglan, scopolamine, aldactone, ceftriaxone, azithromycin   Labs reviewed: Na 138 wnl, K 5.0 wnl, BUN 42(H) Wbc- 11.2(H), Hgb 10.9(L), Hct 35.9(L)  NUTRITION - FOCUSED PHYSICAL EXAM:  Flowsheet Row Most  Recent Value  Orbital Region No depletion  Upper Arm Region No depletion  Thoracic and Lumbar Region No depletion  Buccal Region No depletion  Temple Region No depletion  Clavicle Bone Region No depletion  Clavicle and Acromion Bone Region No depletion  Scapular Bone Region No depletion  Dorsal Hand No depletion  Patellar Region No depletion  Anterior Thigh Region No depletion  Posterior Calf Region No depletion  Edema (RD Assessment) None  Hair Reviewed  Eyes Reviewed  Mouth Reviewed  Skin Reviewed  Nails Reviewed   Diet Order:   Diet Order             Diet NPO time specified  Diet effective now                  EDUCATION NEEDS:   No education needs have been identified at this time  Skin:  Skin Assessment: Reviewed RN Assessment  Last BM:  3/20- type 6  Height:   Ht Readings from Last 1 Encounters:  05/30/22 6' (1.829 m)    Weight:   Wt Readings from Last 1 Encounters:  05/30/22 104.6 kg    Ideal Body Weight:  80.9 kg  BMI:  Body mass index is 31.28 kg/m.  Estimated Nutritional Needs:   Kcal:  2200-2500kcal/day  Protein:  110-125g/day  Fluid:  2.0-2.3L/day  Koleen Distance MS, RD, LDN Please refer to [facility] for RD and/or RD on-call/weekend/after hours pager

## 2022-05-30 NOTE — ED Notes (Addendum)
RN assessed pt brief, brief dry at this time. RT at bedside to administer breathing treatment. RN also messaged MD Reesa Chew inquiring about diet order for patient since pt is on tube feeds.

## 2022-05-30 NOTE — ED Notes (Signed)
Assumed care from [facility]. Pt resting comfortably in bed at this time.

## 2022-05-30 NOTE — ED Notes (Signed)
Pts O2 saturation has dropped to 86%, probe changed, good waveform, RT called

## 2022-05-30 NOTE — Assessment & Plan Note (Signed)
-   We will continue his antihypertensives. 

## 2022-05-30 NOTE — ED Triage Notes (Addendum)
BIBEMS, c/o O2 saturation dropped to 93% on 5L trach collar baseline. 93-98% with EMS. Pt was suctioned with EMS. Caregiver/wife states Pt is more active normally but did not specify how. Pt is GCS: 9 @baseline , vegetative state. PMH: anoxic brain injury, BGL: 147

## 2022-05-30 NOTE — ED Notes (Signed)
Pt has been coughing for bouts of 10-73mins at a time, 179ml of secretions in suction cannister, MD aware

## 2022-05-30 NOTE — H&P (Signed)
[place]   PATIENT NAME: Billy Cherry    MR#:  [id number]  DATE OF BIRTH:  May 25, 1970  DATE OF ADMISSION:  05/30/2022  PRIMARY CARE PHYSICIAN: [facility]   Patient is coming from: Home  REQUESTING/REFERRING PHYSICIAN: Ward, Delice Bison, DO  CHIEF COMPLAINT:  Productive cough  HISTORY OF PRESENT ILLNESS:  Billy Cherry is a 52 y.o. male with medical history significant for anoxic brain injury and persistent vegetative state status post tracheostomy and PEG tube placement, chronic systolic CHF, hypertension, DVT on chronic anticoagulation with Eliquis, who presents to the ED with increased tracheal secretions and cough productive of yellowish sputum as well as occasional wheezing.  No fever or chills.  No reported nausea or vomiting or abdominal pain.  His pulse extremity was in the low 90s per his wife who gave the history earlier and she stated that that was abnormal for him.  Is at baseline on 5 L of O2 via trach collar.  EMS suctioned him and pulse oximetry was in the upper 90s.  The patient dropped his pulse oximetry in the ER and after multiple suctions his pulse extremity improved to 96% after suctioning of a large mucous plug.   ED Course: When he came to the ER, BP was 133/91 and pulse extremity was 99 send on 5 L of O2 through tracheostomy collar.  BMP showed BUN of 42 and was otherwise unremarkable and CBC showed anemia close to baseline and WBCs of 11.2.  Procalcitonin was less than 0.1 and lactic acid 1.3.  Influenza, COVID-19 and RSV PCR's came back negative.  Blood cultures were drawn. EKG as reviewed by me : EKG showed normal sinus rhythm with a rate of EKG showed normal sinus rhythm with a rate of 99 with abnormal R wave progression and Q waves laterally Imaging: Portable chest ray showed no acute cardiopulmonary disease.   The patient was given 4 mg of IV Zofran, 2 g of IV cefepime.  He will be admitted to a medical telemetry bed for further  evaluation and management.   PAST MEDICAL HISTORY:   Past Medical History:  Diagnosis Date   Hypertension     PAST SURGICAL HISTORY:   Past Surgical History:  Procedure Laterality Date   BACK SURGERY     CORONARY/GRAFT ACUTE MI REVASCULARIZATION N/A 03/23/2021   Procedure: Coronary/Graft Acute MI Revascularization;  Surgeon: Isaias Cowman, MD;  Location: [facility];  Service: Cardiovascular;  Laterality: N/A;   IR GASTROSTOMY TUBE MOD SED  04/26/2021   LEFT HEART CATH AND CORONARY ANGIOGRAPHY N/A 03/23/2021   Procedure: LEFT HEART CATH AND CORONARY ANGIOGRAPHY;  Surgeon: Isaias Cowman, MD;  Location: [facility];  Service: Cardiovascular;  Laterality: N/A;   SHOULDER SURGERY      SOCIAL HISTORY:   Social History   Tobacco Use   Smoking status: Never   Smokeless tobacco: Never  Substance Use Topics   Alcohol use: Yes    FAMILY HISTORY:  No family history on file.  Unobtainable due to the patient's anoxic brain injury.  DRUG ALLERGIES:   Allergies  Allergen Reactions   Zestril [Lisinopril] Swelling    angioedema    REVIEW OF SYSTEMS:   ROS As per history of present illness, otherwise unobtainable due to the patient's anoxic brain injury.  MEDICATIONS AT HOME:   Prior to Admission medications   Medication Sig Start Date End Date Taking? Authorizing Provider  apixaban (ELIQUIS) 5 MG TABS tablet  Place 1 tablet (5 mg total) into feeding tube 2 (two) times daily. 11/14/21 05/30/22 Yes Elwin Mocha, MD  atropine 1 % ophthalmic solution Place 2 drops under the tongue 4 (four) times daily. Patient taking differently: Place 2 drops under the tongue 4 (four) times daily. 0800/1400/2000/0200 11/07/21  Yes Elwin Mocha, MD  carvedilol (COREG) 6.25 MG tablet Place 1 tablet (6.25 mg total) into feeding tube 2 (two) times daily with a meal. 05/09/22 07/08/22 Yes Elwin Mocha, MD  chlorhexidine gluconate, MEDLINE KIT, (PERIDEX) 0.12 % solution  15 mLs by Mouth Rinse route 2 (two) times daily. 03/30/21  Yes Darel Hong D, NP  doxazosin (CARDURA) 2 MG tablet PLACE 1 TABLET (2 MG TOTAL) INTO FEEDING TUBE DAILY. Patient taking differently: Place 2 mg into feeding tube at bedtime. 04/20/22 10/17/22 Yes Elwin Mocha, MD  famotidine (PEPCID) 20 MG tablet PLACE 1 TABLET (20 MG TOTAL) INTO FEEDING TUBE 2 (TWO) TIMES DAILY. 03/20/22 09/16/22 Yes Elwin Mocha, MD  fiber (NUTRISOURCE FIBER) PACK packet Place 1 packet into feeding tube 2 (two) times daily. 10/19/21  Yes Dwyane Dee, MD  fluticasone (FLONASE) 50 MCG/ACT nasal spray Place 2 sprays into both nostrils daily. 12/29/21  Yes Elwin Mocha, MD  glycopyrrolate (ROBINUL) 1 MG tablet Place 1 tablet (1 mg total) into feeding tube 3 (three) times daily. Patient taking differently: Place 1 mg into feeding tube 3 (three) times daily. 0800/1400/2000 11/14/21 05/30/22 Yes Elwin Mocha, MD  hydrALAZINE (APRESOLINE) 50 MG tablet Place 1 tablet (50 mg total) into feeding tube 3 (three) times daily. Patient taking differently: Place 50 mg into feeding tube 3 (three) times daily. 0800/1400/2000 11/14/21 05/30/22 Yes Elwin Mocha, MD  isosorbide dinitrate (ISORDIL) 30 MG tablet Place 1 tablet (30 mg total) into feeding tube 3 (three) times daily. 05/09/22  Yes Elwin Mocha, MD  levETIRAcetam (KEPPRA) 100 MG/ML solution PLACE 7.5 MLS (750 MG TOTAL) INTO FEEDING TUBE 2 (TWO) TIMES DAILY. 05/09/22  Yes Elwin Mocha, MD  metoCLOPramide (REGLAN) 5 MG/5ML solution Take 10 mg by mouth every 6 (six) hours. 0200/0800/1400/2000   Yes [provider]  Mouthwashes (MOUTH RINSE) LIQD solution 15 mLs by Mouth Rinse route every 4 (four) hours. 03/30/21  Yes Darel Hong D, NP  Nutritional Supplements (FEEDING SUPPLEMENT, OSMOLITE 1.5 CAL,) LIQD Place 1,000 mLs into feeding tube continuous. 10/04/21  Yes Shelly Coss, MD  scopolamine (TRANSDERM-SCOP) 1 MG/3DAYS PLACE 1 PATCH ONTO THE SKIN EVERY  3 DAYS. 05/09/22  Yes Elwin Mocha, MD  spironolactone (ALDACTONE) 25 MG tablet Place 1 tablet (25 mg total) into feeding tube daily. 05/09/22 08/07/22 Yes Elwin Mocha, MD  valproic acid (DEPAKENE) 250 MG/5ML solution Place 15 mLs (750 mg total) into feeding tube 3 (three) times daily. 05/09/22 11/05/22 Yes Elwin Mocha, MD  Water For Irrigation, Sterile (FREE WATER) SOLN Place 200 mLs into feeding tube every 6 (six) hours. 08/07/21  Yes Pokhrel, Laxman, MD  Zinc Oxide (DESITIN) 40 % PSTE Apply 1 Application topically in the morning and at bedtime. 04/28/22 06/27/22 Yes Elwin Mocha, MD  acetaminophen (TYLENOL) 325 MG tablet Take 650 mg by mouth every 6 (six) hours as needed.    [provider]  albuterol (PROVENTIL) (2.5 MG/3ML) 0.083% nebulizer solution Take 3 mLs (2.5 mg total) by nebulization every 6 (six) hours as needed for wheezing or shortness of breath. 11/11/21   Elwin Mocha, MD  clonazePAM (KLONOPIN) 0.5 MG tablet  Place 1 tablet (0.5 mg total) into feeding tube 2 (two) times daily as needed for anxiety. 05/29/22 06/28/22  Elwin Mocha, MD  GAVILAX 17 GM/SCOOP powder Place 1 capful (17 g) into feeding tube daily as needed for mild constipation or moderate constipation. 12/29/21   Elwin Mocha, MD  ipratropium (ATROVENT) 0.02 % nebulizer solution Inhale 0.25 mg into the lungs every 6 (six) hours as needed. 0800/1400/2000/0200    [provider]  ipratropium-albuterol (DUONEB) 0.5-2.5 (3) MG/3ML SOLN TAKE 3 ML BY NEBULIZATION EVERY 4 HOUR AS NEED (WHEEZING, SHORTNESS OF BREATH). USE FIRST BEFORE ALBUTEROL 05/09/22   Elwin Mocha, MD  liver oil-zinc oxide (DESITIN) 40 % ointment Apply topically. Apply 1 application. topically as needed for dry skin.    [provider]  Loperamide HCl 1 MG/7.5ML LIQD Place 15 mLs (2 mg total) into feeding tube daily as needed for diarrhea or loose stools. Patient taking differently: Place 2 mg into feeding tube 4 (four)  times daily as needed. 11/14/21   Elwin Mocha, MD  Nebulizer System All-In-One MISC 1 Inhalation by Does not apply route every hour as needed. 11/11/21   Elwin Mocha, MD  ondansetron (ZOFRAN) 4 MG tablet Place 1 tablet (4 mg total) into feeding tube every 8 (eight) hours as needed for nausea or vomiting. Crush finely and place through G tube, followed by a flush 05/09/22 06/08/22  Elwin Mocha, MD      VITAL SIGNS:  Blood pressure 121/62, pulse 93, temperature (!) 100.9 F (38.3 C), temperature source Rectal, resp. rate (!) 26, SpO2 93 %.  PHYSICAL EXAMINATION:  Physical Exam  GENERAL:  52 y.o.-year-old male patient lying in the bed with mild respiratory distress. EYES: Pupils equal, round, reactive to light and accommodation. No scleral icterus. Extraocular muscles intact.  HEENT: Head atraumatic, normocephalic. Oropharynx and nasopharynx clear.  Tracheostomy with collar in place. NECK:  Supple, no jugular venous distention. No thyroid enlargement, no tenderness.  LUNGS: Diminished bibasal breath sounds with diffuse rhonchi and occasional wheezes.  No use of accessory muscles of respiration.  CARDIOVASCULAR: Regular rate and rhythm, S1, S2 normal. No murmurs, rubs, or gallops.  ABDOMEN: Soft, nondistended, nontender. Bowel sounds present. No organomegaly or mass.  EXTREMITIES: No pedal edema, cyanosis, or clubbing.  NEUROLOGIC: Cranial nerves II through XII are intact. Muscle strength 5/5 in all extremities. Sensation intact. Gait not checked.  PSYCHIATRIC: The patient is alert and oriented x 3.  Normal affect and good eye contact. SKIN: No obvious rash, lesion, or ulcer.  LABORATORY PANEL:   CBC Recent Labs  Lab 05/30/22 0258  WBC 11.2*  HGB 10.9*  HCT 35.9*  PLT 208   ------------------------------------------------------------------------------------------------------------------  Chemistries  Recent Labs  Lab 05/30/22 0258  NA 138  K 5.0  CL 98  CO2 31   GLUCOSE 139*  BUN 42*  CREATININE 1.18  CALCIUM 9.7   ------------------------------------------------------------------------------------------------------------------  Cardiac Enzymes No results for input(s): "TROPONINI" in the last 168 hours. ------------------------------------------------------------------------------------------------------------------  RADIOLOGY:  DG Chest Portable 1 View  Result Date: 05/30/2022 CLINICAL DATA:  Shortness of breath EXAM: PORTABLE CHEST 1 VIEW COMPARISON:  None Available. FINDINGS: The heart size and mediastinal contours are within normal limits. Both lungs are clear. The visualized skeletal structures are unremarkable. Tracheostomy tube tip is at the level of the clavicular heads. IMPRESSION: No active disease. Electronically Signed   By: Ulyses Jarred M.D.   On: 05/30/2022 02:51      IMPRESSION AND  PLAN:  Assessment and Plan: * Acute tracheobronchitis - The patient be admitted to a medical telemetry bed. - We will continue him on hydration with IV normal saline. - We will continue bronchodilator therapy with DuoNebs 4 times daily and every 4 hours as needed as well as mucolytic therapy. - We will continue IV antibiotic therapy with Rocephin and Zithromax given significant amount of purulent sputum that was suctioned.  Acute on chronic respiratory failure with hypoxia ([name]) - Is secondary to #1 and possibly due to mucous plug. - O2 protocol will be followed. - Management otherwise as above.  Essential hypertension - We will continue his antihypertensives.  Seizure disorder ([place]) - We will continue his antiseizure medications.  GERD without esophagitis - We will continue PPI therapy  Anxiety - We will continue his Klonopin.   DVT prophylaxis: Lovenox.  Advanced Care Planning:  Code Status: full code.  Family Communication:  The plan of care was discussed in details with the patient (and family). I answered all questions. The  patient agreed to proceed with the above mentioned plan. Further management will depend upon hospital course. Disposition Plan: Back to previous home environment Consults called: none.  All the records are reviewed and case discussed with ED provider.  Status is: Inpatien  At the time of the admission, it appears that the appropriate admission status for this patient is inpatient.  This is judged to be reasonable and necessary in order to provide the required intensity of service to ensure the patient's safety given the presenting symptoms, physical exam findings and initial radiographic and laboratory data in the context of comorbid conditions.  The patient requires inpatient status due to high intensity of service, high risk of further deterioration and high frequency of surveillance required.  I certify that at the time of admission, it is my clinical judgment that the patient will require inpatient hospital care extending more than 2 midnights.                            Dispo: The patient is from: Home              Anticipated d/c is to: Home              Patient currently is not medically stable to d/c.              Difficult to place patient: No  Christel Mormon M.D on 05/30/2022 at 6:23 AM  [facility]   From 7 PM-7 AM, contact night-coverage [email or web address]  CC: Primary care physician; [facility] [place]

## 2022-05-30 NOTE — Assessment & Plan Note (Signed)
-   We will continue his Klonopin.

## 2022-05-30 NOTE — ED Notes (Signed)
Pt is having a cough episode with large amounts of thick secretions noted from trach. Pt is sating between 93-100%, called RT to assist in suctioning patient at this time

## 2022-05-30 NOTE — Hospital Course (Signed)
Billy Cherry is a 52 y.o. male with medical history significant for anoxic brain injury and persistent vegetative state status post tracheostomy and PEG tube placement, chronic systolic CHF, hypertension, DVT on chronic anticoagulation with Eliquis, who presents to the ED with increased tracheal secretions and cough productive of yellowish sputum as well as occasional wheezing.  No fever or chills.  No reported nausea or vomiting or abdominal pain.  His pulse extremity was in the low 90s per his wife who gave the history earlier and she stated that that was abnormal for him.  Is at baseline on 5 L of O2 via trach collar.  EMS suctioned him and pulse oximetry was in the upper 90s.  The patient dropped his pulse oximetry in the ER and after multiple suctions his pulse extremity improved to 96% after suctioning of a large mucous plug.   ED Course: When he came to the ER, BP was 133/91 and pulse extremity was 99 send on 5 L of O2 through tracheostomy collar.  BMP showed BUN of 42 and was otherwise unremarkable and CBC showed anemia close to baseline and WBCs of 11.2.  Procalcitonin was less than 0.1 and lactic acid 1.3.  Influenza, COVID-19 and RSV PCR's came back negative.  Blood cultures were drawn. EKG as reviewed by me : EKG showed normal sinus rhythm with a rate of EKG showed normal sinus rhythm with a rate of 99 with abnormal R wave progression and Q waves laterally Imaging: Portable chest ray showed no acute cardiopulmonary disease.   The patient was given 4 mg of IV Zofran, 2 g of IV cefepime.  He will be admitted to a medical telemetry bed for further evaluation and management.    GENERAL:  52 y.o.-year-old male patient lying in the bed with mild respiratory distress. EYES: Pupils equal, round, reactive to light and accommodation. No scleral icterus. Extraocular muscles intact.  HEENT: Head atraumatic, normocephalic. Oropharynx and nasopharynx clear.  Tracheostomy with collar in place. NECK:   Supple, no jugular venous distention. No thyroid enlargement, no tenderness.  LUNGS: Diminished bibasal breath sounds with diffuse rhonchi and occasional wheezes.  No use of accessory muscles of respiration.  CARDIOVASCULAR: Regular rate and rhythm, S1, S2 normal. No murmurs, rubs, or gallops.  ABDOMEN: Soft, nondistended, nontender. Bowel sounds present. No organomegaly or mass.  EXTREMITIES: No pedal edema, cyanosis, or clubbing.  NEUROLOGIC: Cranial nerves II through XII are intact. Muscle strength 5/5 in all extremities. Sensation intact. Gait not checked.  PSYCHIATRIC: The patient is alert and oriented x 3.  Normal affect and good eye contact. SKIN: No obvious rash, lesion, or ulcer.

## 2022-05-30 NOTE — Progress Notes (Signed)
RN called down for trach patient in distress and coughing continuously. Placed pt on 30% trach collar and gave lidocaine nebulizer. Suctioned copious amounts of white thick sputum. Changed inner cannula and cleaned around trach and placed new dressing.

## 2022-05-30 NOTE — Assessment & Plan Note (Signed)
-   Is secondary to #1 and possibly due to mucous plug. - O2 protocol will be followed. - Management otherwise as above.

## 2022-05-30 NOTE — ED Provider Notes (Addendum)
[facility] Provider Note    Event Date/Time   First MD Initiated Contact with Patient 05/30/22 0150     (approximate)   History   No chief complaint on file.   HPI  Billy Cherry is a 52 y.o. 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  EMS states wife said his sats were in the low 90s which was abnormal for him.  Normally on 5 L trach collar.  Suction by EMS and sats now in the upper 90s.  Unfortunately no family at bedside.   History provided by EMS.    Past Medical History:  Diagnosis Date   Hypertension     Past Surgical History:  Procedure Laterality Date   BACK SURGERY     CORONARY/GRAFT ACUTE MI REVASCULARIZATION N/A 03/23/2021   Procedure: Coronary/Graft Acute MI Revascularization;  Surgeon: Isaias Cowman, MD;  Location: [facility];  Service: Cardiovascular;  Laterality: N/A;   IR GASTROSTOMY TUBE MOD SED  04/26/2021   LEFT HEART CATH AND CORONARY ANGIOGRAPHY N/A 03/23/2021   Procedure: LEFT HEART CATH AND CORONARY ANGIOGRAPHY;  Surgeon: Isaias Cowman, MD;  Location: [facility];  Service: Cardiovascular;  Laterality: N/A;   SHOULDER SURGERY      MEDICATIONS:  Prior to Admission medications   Medication Sig Start Date End Date Taking? Authorizing Provider  acetaminophen (TYLENOL) 325 MG tablet Take 650 mg by mouth every 6 (six) hours as needed.    [provider]  albuterol (PROVENTIL) (2.5 MG/3ML) 0.083% nebulizer solution Take 3 mLs (2.5 mg total) by nebulization every 6 (six) hours as needed for wheezing or shortness of breath. 11/11/21   Elwin Mocha, MD  apixaban (ELIQUIS) 5 MG TABS tablet Place 1 tablet (5 mg total) into feeding tube 2 (two) times daily. 11/14/21 05/17/22  Elwin Mocha, MD  atropine 1 % ophthalmic  solution Place 2 drops under the tongue 4 (four) times daily. Patient taking differently: Place 2 drops under the tongue 4 (four) times daily. 0800/1400/2000/0200 11/07/21   Elwin Mocha, MD  carvedilol (COREG) 6.25 MG tablet Place 1 tablet (6.25 mg total) into feeding tube 2 (two) times daily with a meal. 05/09/22 07/08/22  Elwin Mocha, MD  chlorhexidine gluconate, MEDLINE KIT, (PERIDEX) 0.12 % solution 15 mLs by Mouth Rinse route 2 (two) times daily. 03/30/21   Bradly Bienenstock, NP  clonazePAM (KLONOPIN) 0.5 MG tablet Place 1 tablet (0.5 mg total) into feeding tube 2 (two) times daily as needed for anxiety. 05/29/22 06/28/22  Elwin Mocha, MD  doxazosin (CARDURA) 2 MG tablet PLACE 1 TABLET (2 MG TOTAL) INTO FEEDING TUBE DAILY. Patient taking differently: Place 2 mg into feeding tube at bedtime. 04/20/22 10/17/22  Elwin Mocha, MD  famotidine (PEPCID) 20 MG tablet PLACE 1 TABLET (20 MG TOTAL) INTO FEEDING TUBE 2 (TWO) TIMES DAILY. 03/20/22 09/16/22  Elwin Mocha, MD  fiber (NUTRISOURCE FIBER) PACK packet Place 1 packet into feeding tube 2 (two) times daily. 10/19/21   Dwyane Dee, MD  fluticasone (FLONASE) 50 MCG/ACT nasal spray Place 2 sprays into both nostrils daily. 12/29/21   Elwin Mocha, MD  GAVILAX 17 GM/SCOOP powder Place 1 capful (17 g) into feeding tube daily as needed for mild constipation or moderate constipation. 12/29/21   Elwin Mocha, MD  glycopyrrolate (ROBINUL) 1 MG tablet Place 1 tablet (1 mg total) into feeding tube 3 (three) times daily. Patient taking differently: Place 1 mg into feeding tube 3 (three) times daily. 0800/1400/2000 11/14/21 05/17/22  Elwin Mocha, MD  hydrALAZINE (APRESOLINE) 50 MG tablet Place 1 tablet (50 mg total) into feeding tube 3 (three) times daily. Patient taking differently: Place 50 mg into feeding tube 3 (three) times daily. 0800/1400/2000 11/14/21 05/17/22  Elwin Mocha, MD  ipratropium (ATROVENT) 0.02 % nebulizer solution  Inhale 0.25 mg into the lungs every 6 (six) hours as needed. 0800/1400/2000/0200    [provider]  ipratropium-albuterol (DUONEB) 0.5-2.5 (3) MG/3ML SOLN TAKE 3 ML BY NEBULIZATION EVERY 4 HOUR AS NEED (WHEEZING, SHORTNESS OF BREATH). USE FIRST BEFORE ALBUTEROL 05/09/22   Elwin Mocha, MD  isosorbide dinitrate (ISORDIL) 30 MG tablet Place 1 tablet (30 mg total) into feeding tube 3 (three) times daily. 05/09/22   Elwin Mocha, MD  levETIRAcetam (KEPPRA) 100 MG/ML solution PLACE 7.5 MLS (750 MG TOTAL) INTO FEEDING TUBE 2 (TWO) TIMES DAILY. 05/09/22   Elwin Mocha, MD  liver oil-zinc oxide (DESITIN) 40 % ointment Apply topically. Apply 1 application. topically as needed for dry skin.    [provider]  Loperamide HCl 1 MG/7.5ML LIQD Place 15 mLs (2 mg total) into feeding tube daily as needed for diarrhea or loose stools. Patient taking differently: Place 2 mg into feeding tube 4 (four) times daily as needed. 11/14/21   Elwin Mocha, MD  metoCLOPramide (REGLAN) 5 MG/5ML solution Take 10 mg by mouth every 6 (six) hours. 0200/0800/1400/2000    [provider]  Mouthwashes (MOUTH RINSE) LIQD solution 15 mLs by Mouth Rinse route every 4 (four) hours. 03/30/21   Bradly Bienenstock, NP  Nebulizer System All-In-One MISC 1 Inhalation by Does not apply route every hour as needed. 11/11/21   Elwin Mocha, MD  Nutritional Supplements (FEEDING SUPPLEMENT, OSMOLITE 1.5 CAL,) LIQD Place 1,000 mLs into feeding tube continuous. 10/04/21   Shelly Coss, MD  ondansetron (ZOFRAN) 4 MG tablet Place 1 tablet (4 mg total) into feeding tube every 8 (eight) hours as needed for nausea or vomiting. Crush finely and place through G tube, followed by a flush 05/09/22 06/08/22  Elwin Mocha, MD  scopolamine (TRANSDERM-SCOP) 1 MG/3DAYS PLACE 1 PATCH ONTO THE SKIN EVERY 3 DAYS. 05/09/22   Elwin Mocha, MD  spironolactone (ALDACTONE) 25 MG tablet Place 1 tablet (25 mg total) into feeding tube  daily. 05/09/22 08/07/22  Elwin Mocha, MD  valproic acid (DEPAKENE) 250 MG/5ML solution Place 15 mLs (750 mg total) into feeding tube 3 (three) times daily. 05/09/22 11/05/22  Elwin Mocha, MD  Water For Irrigation, Sterile (FREE WATER) SOLN Place 200 mLs into feeding tube every 6 (six) hours. 08/07/21   Pokhrel, Corrie Mckusick, MD  Zinc Oxide (DESITIN) 40 % PSTE Apply 1 Application topically in the morning and at bedtime. 04/28/22 06/27/22  Elwin Mocha, MD    Physical Exam   Triage Vital Signs: ED Triage Vitals [05/30/22 0157]  Enc Vitals Group     BP (!) 133/91     Pulse Rate 99     Resp 16     Temp 99 F (37.2 C)     Temp Source Oral     SpO2 99 %     Weight      Height      Head Circumference      Peak  Flow      Pain Score      Pain Loc      Pain Edu?      Excl. in [place]?     Most recent vital signs: Vitals:   05/30/22 0157 05/30/22 0400  BP: (!) 133/91   Pulse: 99   Resp: 16   Temp: 99 F (37.2 C)   SpO2: 99% 100%    CONSTITUTIONAL: Will open eyes intermittently but does not follow commands, answer questions which is his baseline from his anoxic brain injury. HEAD: Normocephalic EYES: Conjunctivae clear, pupils appear equal, sclera nonicteric ENT: normal nose; moist mucous membranes NECK: Trach in place without secretions, bleeding CARD: RRR; S1 and S2 appreciated RESP: Slightly rhonchorous breath sounds diffusely, no wheezing or rales, satting 100% on normal 5 L trach collar ABD/GI: Non-distended; soft BACK: The back appears normal EXT: No calf tenderness or calf swelling, no peripheral edema SKIN: Normal color for age and race; warm; no rash on exposed skin NEURO: Nonverbal, does not follow commands, will open eyes intermittently   ED Results / Procedures / Treatments   LABS: (all labs ordered are listed, but only abnormal results are displayed) Labs Reviewed  CBC WITH DIFFERENTIAL/PLATELET - Abnormal; Notable for the following components:      Result Value    WBC 11.2 (*)    RBC 3.41 (*)    Hemoglobin 10.9 (*)    HCT 35.9 (*)    MCV 105.3 (*)    Monocytes Absolute 1.7 (*)    All other components within normal limits  BASIC METABOLIC PANEL - Abnormal; Notable for the following components:   Glucose, Bld 139 (*)    BUN 42 (*)    All other components within normal limits  RESP PANEL BY RT-PCR (RSV, FLU A&B, COVID)  RVPGX2  EXPECTORATED SPUTUM ASSESSMENT W GRAM STAIN, RFLX TO RESP C  CULTURE, BLOOD (ROUTINE X 2)  CULTURE, BLOOD (ROUTINE X 2)  BRAIN NATRIURETIC PEPTIDE  PROCALCITONIN  LACTIC ACID, PLASMA  LACTIC ACID, PLASMA     EKG:  EKG Interpretation  Date/Time:  Wednesday May 30 2022 01:56:53 EDT Ventricular Rate:  99 PR Interval:  157 QRS Duration: 96 QT Interval:  357 QTC Calculation: 459 R Axis:   -21 Text Interpretation: Sinus rhythm Abnormal R-wave progression, early transition LVH with secondary repolarization abnormality Confirmed by Pryor Curia [phone]) on 05/30/2022 2:04:54 AM         RADIOLOGY: My personal review and interpretation of imaging: Chest x-ray clear.  I have personally reviewed all radiology reports.   DG Chest Portable 1 View  Result Date: 05/30/2022 CLINICAL DATA:  Shortness of breath EXAM: PORTABLE CHEST 1 VIEW COMPARISON:  None Available. FINDINGS: The heart size and mediastinal contours are within normal limits. Both lungs are clear. The visualized skeletal structures are unremarkable. Tracheostomy tube tip is at the level of the clavicular heads. IMPRESSION: No active disease. Electronically Signed   By: Ulyses Jarred M.D.   On: 05/30/2022 02:51     PROCEDURES:  Critical Care performed: No     .1-3 Lead EKG Interpretation  Performed by: Lacara Dunsworth, Delice Bison, DO Authorized by: Braelynn Benning, Delice Bison, DO     Interpretation: normal     ECG rate:  99   ECG rate assessment: normal     Rhythm: sinus rhythm     Ectopy: none     Conduction: normal       IMPRESSION / MDM / ASSESSMENT AND PLAN /  ED COURSE  I reviewed the triage vital signs and the nursing notes.    Patient here with respiratory complaints.  Sats were in the low 90s per EMS.  He was suctioned by EMS and now seems to be back to his baseline.  Unfortunately no family at bedside to provide additional history.  The patient is on the cardiac monitor to evaluate for evidence of arrhythmia and/or significant heart rate changes.   DIFFERENTIAL DIAGNOSIS (includes but not limited to):   Tracheitis, pneumonia, CHF exacerbation, PE, pneumothorax, increased secretions   Patient's presentation is most consistent with acute presentation with potential threat to life or bodily function.   PLAN: Patient appears to be at his baseline currently.  Will obtain CBC, BMP, BNP, COVID and flu swabs, chest x-ray.  Will continue to monitor.  EMS reports family is supposed to be coming to the ER.   MEDICATIONS GIVEN IN ED: Medications  ceFEPIme (MAXIPIME) 2 g in sodium chloride 0.9 % 100 mL IVPB (has no administration in time range)  guaiFENesin-codeine 100-10 MG/5ML solution 10 mL (has no administration in time range)  ipratropium-albuterol (DUONEB) 0.5-2.5 (3) MG/3ML nebulizer solution 3 mL (3 mLs Nebulization Given 05/30/22 0410)  lidocaine (PF) (XYLOCAINE) 1 % injection 5 mL (5 mLs Other Given 05/30/22 0405)  ondansetron (ZOFRAN) injection 4 mg (4 mg Intravenous Given 05/30/22 0407)     ED COURSE:  3:50 AM  Pt's chest x-ray reviewed and interpreted by myself and the radiologist and is unremarkable.  Patient now started to wheeze and has some increased work of breathing.  We have suctioned him and he is now bringing up large amount of thick yellow sputum.  Will send this for culture.  I am concerned for tracheitis, tracheobronchitis.  Will give IV antibiotics.  He does have a elevated white blood cell count today and oral temperature of 99.  Will add on lactic, procalcitonin and blood cultures.  Anticipate admission.     Patient now  coughing repeatedly and vomiting.  Will give IV Zofran, guaifenesin with codeine and his G-tube and nebulized lidocaine.  Will give DuoNeb as well.  Still on trach collar.  I feel he will need admission at this time.  Will discuss with hospitalist.   4:32 AM  Spoke with wife.  She said his O2 dropped to 88% tonight on his 5L.  She was suctioning frequently and sats would improve briefly and then dropped again.  She states this is abnormal for him.  Updated her that he would be [facility] to [facility].   CONSULTS:  Consulted and discussed patient's case with hospitalist, Dr. Sidney Ace.  I have recommended admission and consulting physician agrees and will place admission orders.  Patient (and family if present) agree with this plan.   I reviewed all nursing notes, vitals, pertinent previous records.  All labs, EKGs, imaging ordered have been independently reviewed and interpreted by myself.    OUTSIDE RECORDS REVIEWED: Reviewed recent admission 05/17/2022 to 05/22/2022 for acute tracheobronchitis.       FINAL CLINICAL IMPRESSION(S) / ED DIAGNOSES   Final diagnoses:  SOB (shortness of breath)  Tracheitis     Rx / DC Orders   ED Discharge Orders     None        Note:  This document was prepared using Dragon voice recognition software and may include unintentional dictation errors.   Shirell Struthers, Delice Bison, DO 05/30/22 0406    Kanden Carey, Delice Bison, DO 05/30/22 [phone]

## 2022-05-31 DIAGNOSIS — J209 Acute bronchitis, unspecified: Secondary | ICD-10-CM | POA: Diagnosis not present

## 2022-05-31 LAB — CBC
HCT: 33.2 % — ABNORMAL LOW (ref 39.0–52.0)
Hemoglobin: 10.2 g/dL — ABNORMAL LOW (ref 13.0–17.0)
MCH: 32 pg (ref 26.0–34.0)
MCHC: 30.7 g/dL (ref 30.0–36.0)
MCV: 104.1 fL — ABNORMAL HIGH (ref 80.0–100.0)
Platelets: 207 10*3/uL (ref 150–400)
RBC: 3.19 MIL/uL — ABNORMAL LOW (ref 4.22–5.81)
RDW: 14 % (ref 11.5–15.5)
WBC: 8.8 10*3/uL (ref 4.0–10.5)
nRBC: 0 % (ref 0.0–0.2)

## 2022-05-31 LAB — BASIC METABOLIC PANEL
Anion gap: 9 (ref 5–15)
BUN: 42 mg/dL — ABNORMAL HIGH (ref 6–20)
CO2: 30 mmol/L (ref 22–32)
Calcium: 9.5 mg/dL (ref 8.9–10.3)
Chloride: 102 mmol/L (ref 98–111)
Creatinine, Ser: 1.21 mg/dL (ref 0.61–1.24)
GFR, Estimated: >60 mL/min (ref >60)
Glucose, Bld: 137 mg/dL — ABNORMAL HIGH (ref 70–99)
Potassium: 5 mmol/L (ref 3.5–5.1)
Sodium: 141 mmol/L (ref 135–145)

## 2022-05-31 LAB — PHOSPHORUS: Phosphorus: 4.6 mg/dL (ref 2.5–4.6)

## 2022-05-31 LAB — RESPIRATORY PANEL BY PCR

## 2022-05-31 LAB — MAGNESIUM: Magnesium: 1.9 mg/dL (ref 1.7–2.4)

## 2022-05-31 MED ORDER — POLYETHYLENE GLYCOL 3350 17 G PO PACK
17.0000 g | PACK | Freq: Two times a day (BID) | ORAL | Status: DC
  Administered 2022-05-31 – 2022-06-05 (×8): 17 g
  Filled 2022-05-31 (×8): qty 1

## 2022-05-31 NOTE — Progress Notes (Signed)
While doing pt trach care pt threw up a large amount of what appeared to be tube feed. RN made aware.

## 2022-05-31 NOTE — TOC Initial Note (Signed)
Transition of Care [name]) - Initial/Assessment Note    Patient Details  Name: Billy Cherry MRN: [id number] Date of Birth: [birth year]  Transition of [facility]) CM/SW Contact:    Candie Chroman, LCSW Phone Number: 05/31/2022, 2:32 PM  Clinical Narrative:   Readmission prevention screen complete. CSW called patient's wife, introduced role, and explained that discharge planning would be discussed. PCP is Lavonna Monarch, MD at [facility]. Patient transports to appointments using EMS. [facility]. No issues obtaining medications. Patient has private duty nursing through [place] [employer] and [facility]. Wife stated he gets 112 hours per week between the two agencies. Patient has oxygen, humidifier, nebulizer, feeding pump, suction, and hospital bed. Patient will need EMS transport home at discharge. Address on facesheet is correct. CSW spoke with [facility] liaison to confirm what they would need at discharge. CSW asked wife to have [facility] call if they needed to discuss disposition plan. No further concerns. CSW encouraged patient's wife to contact CSW as needed. CSW will continue to follow patient and his wife for support and facilitate return home once stable.              Expected Discharge Plan: [place] Barriers to Discharge: Continued Medical Work up   Patient Goals and CMS Choice            Expected Discharge Plan and Services     Post Acute Care Choice: Resumption of Svcs/PTA Provider Living arrangements for the past 2 months: Single Family Home                                      Prior Living Arrangements/Services Living arrangements for the past 2 months: Single Family Home Lives with:: Spouse Patient language and need for interpreter reviewed:: Yes Do you feel safe going back to the place where you live?: Yes      Need for Family Participation in Patient Care: Yes (Comment) Care giver support system in place?: Yes  (comment) Current home services: DME, Home RN Criminal Activity/Legal Involvement Pertinent to Current Situation/Hospitalization: No - Comment as needed  Activities of Daily Living Home Assistive Devices/Equipment: Enteral Feeding Supplies, Vent/Trach supplies ADL Screening (condition at time of admission) Patient's cognitive ability adequate to safely complete daily activities?: No Is the patient deaf or have difficulty hearing?: No Does the patient have difficulty seeing, even when wearing glasses/contacts?: No Does the patient have difficulty concentrating, remembering, or making decisions?: Yes Patient able to express need for assistance with ADLs?: No Does the patient have difficulty dressing or bathing?: Yes Independently performs ADLs?: No Communication: Dependent Is this a change from baseline?: Pre-admission baseline Dressing (OT): Dependent Is this a change from baseline?: Pre-admission baseline Grooming: Dependent Is this a change from baseline?: Pre-admission baseline Feeding: Dependent Is this a change from baseline?: Pre-admission baseline Bathing: Dependent Is this a change from baseline?: Pre-admission baseline Toileting: Dependent Is this a change from baseline?: Pre-admission baseline In/Out Bed: Dependent Is this a change from baseline?: Pre-admission baseline Walks in Home: Dependent Is this a change from baseline?: Pre-admission baseline Does the patient have difficulty walking or climbing stairs?: Yes Weakness of Legs: Both Weakness of Arms/Hands: Both  Permission Sought/Granted Permission sought to share information with : Facility Sport and exercise psychologist, Family Supports    Share Information with NAME: [name]  Permission granted to share info w AGENCY: [facility],  [facility]  Permission granted to share info w Relationship: Spouse  Permission granted to share info w Contact Information: [phone]  Emotional Assessment Appearance:: Appears stated  age Attitude/Demeanor/Rapport: Unable to Assess Affect (typically observed): Unable to Assess   Alcohol / Substance Use: Not Applicable Psych Involvement: No (comment)  Admission diagnosis:  Acute tracheobronchitis [J20.9] Tracheitis [J04.10] SOB (shortness of breath) [R06.02] Acute on chronic respiratory failure with hypoxia ([name]) [J96.21] Patient Active Problem List   Diagnosis Date Noted   Essential hypertension 05/30/2022   Anxiety 05/30/2022   Seizure disorder ([place]) 05/30/2022   GERD without esophagitis 05/30/2022   Acute tracheitis 05/18/2022   Acute tracheobronchitis 05/17/2022   Acute on chronic respiratory failure with hypoxia ([place]) 05/17/2022   Gastroenteritis 01/09/2022   Severe sepsis ([place]) 01/06/2022   Aspiration pneumonia of both lower lobes ([place]) 01/06/2022   Pressure injury of buttock, stage 2 ([place]) 10/17/2021   Pressure injury of right heel, stage 1 10/17/2021   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   S/P percutaneous endoscopic gastrostomy (PEG) tube placement ([place]) 10/04/2021   Obesity (BMI 30-39.9) 08/16/2021   Pulmonary embolus ([place]) 06/06/2021   Pressure injury of skin 05/25/2021   DVT (deep venous thrombosis) ([place]) 05/16/2021   Dysphagia 05/01/2021   HTN (hypertension) 04/24/2021   Sepsis ([place]) 04/20/2021   HAP (hospital-acquired pneumonia) [id number]   Chronic systolic CHF (congestive heart failure) ([place]) 04/05/2021   Anoxic brain injury ([place]) 04/03/2021   Myoclonus 03/30/2021   Chronic respiratory failure with hypoxia [facility])    History of cardiac arrest 03/23/2021   PCP:  [facility], [place]:   [id number] [place] [place] [facility] [address] [place] [address] Phone: [phone] Fax: [place] [address] [place] [address] Phone: [phone] Fax: [phone]     Social Determinants of Health (SDOH) Social History: SDOH Screenings   Tobacco  Use: Low Risk  (05/30/2022)   SDOH Interventions:     Readmission Risk Interventions    05/31/2022    2:30 PM 01/08/2022    3:18 PM 04/14/2021   11:14 AM  Readmission Risk Prevention Plan  Transportation Screening Complete Complete Complete  PCP or Specialist Appt within 3-5 Days Complete Complete   HRI or Home Care Consult Complete Complete Complete  Social Work Consult for [place] Planning/Counseling Complete Complete Complete  Palliative Care Screening Not Applicable Not Applicable Complete  Medication Review Press photographer) Complete Complete Complete

## 2022-05-31 NOTE — Progress Notes (Signed)
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  Chest x-ray was overall unremarkable.  Procalcitonin, BNP negative.  COVID/flu/RSV were negative.  Tube feeds restarted at a slower rate   Assessment & Plan:  Principal Problem:   Acute tracheobronchitis Active Problems:   Acute on chronic respiratory failure with hypoxia ([name])   Essential hypertension   Seizure disorder ([name])   Anxiety   GERD without esophagitis    Assessment and Plan: * Acute tracheobronchitis - BNP, procalcitonin, flu, RSV, COVID-19-negative.   Currently on bronchodilators scheduled and as needed.  Continue azithromycin and Rocephin.  Respiratory cultures growing GNR, GPC.  Respiratory panel still pending IV fluids stopped, tube feeds started at a slower rate on 3/27.  Acute on chronic respiratory failure with hypoxia ([name]) History of congestive CHF with EF 30% - Is secondary to #1 and possibly due to mucous plug. - O2 protocol will be followed. - Management otherwise as above.  Essential hypertension - We will continue his antihypertensives.  Seizure disorder ([place]) - Keppra   GERD without esophagitis - We will continue PPI therapy  Anxiety - We will continue his Klonopin.   DVT prophylaxis: Eliquis Code Status: Full  Family Communication:   Status is: Inpatient Remains inpatient appropriate because: Cont hospital stay. Still on 9L [place] via trach    Subjective: Early this morning patient had large vomitus which appeared to be his tube feeds. Resting comfortably Examination: Constitutional: Unresponsive.  On 5 L nasal cannula Respiratory: Some rhonchi heard anteriorly Cardiovascular:  Normal sinus rhythm, no rubs Abdomen: Nontender nondistended good bowel sounds Musculoskeletal: No edema noted Skin: No rashes seen Neurologic: Unable to assess Psychiatric: Unable to assess   Objective: Vitals:   05/31/22 0436 05/31/22 0437 05/31/22 0500 05/31/22 0521  BP: 101/70   (!) 101/58  Pulse: (!) 102   (!) 107  Resp: 19   18  Temp: 98.9 F (37.2 C)   99.1 F (37.3 C)  TempSrc: Axillary   Axillary  SpO2: 100%  100% 99%  Weight:  105.7 kg    Height:        Intake/Output Summary (Last 24 hours) at 05/31/2022 0813 Last data filed at 05/31/2022 [id number] Gross per 24 hour  Intake 1790.28 ml  Output 400 ml  Net 1390.28 ml   Filed Weights   05/30/22 0723 05/31/22 0437  Weight: 104.6 kg 105.7 kg     Data Reviewed:   CBC: Recent Labs  Lab 05/30/22 0258 05/31/22 0428  WBC 11.2* 8.8  NEUTROABS 7.3  --   HGB 10.9* 10.2*  HCT 35.9* 33.2*  MCV 105.3* 104.1*  PLT 208 [id number]   Basic Metabolic Panel: Recent Labs  Lab 05/30/22 0258 05/31/22 0428  NA 138 141  K 5.0 5.0  CL 98 102  CO2 31 30  GLUCOSE 139* 137*  BUN 42* 42*  CREATININE 1.18 1.21  CALCIUM 9.7 9.5  MG  --  1.9  PHOS  --  4.6   GFR: Estimated Creatinine Clearance: 90.7 mL/min (by C-G formula based on SCr of 1.21 mg/dL). Liver Function Tests: No results for input(s): "AST", "ALT", "ALKPHOS", "BILITOT", "PROT", "ALBUMIN" in the last 168 hours. No results for input(s): "LIPASE", "AMYLASE" in the last 168  hours. No results for input(s): "AMMONIA" in the last 168 hours. Coagulation Profile: No results for input(s): "INR", "PROTIME" in the last 168 hours. Cardiac Enzymes: No results for input(s): "CKTOTAL", "CKMB", "CKMBINDEX", "TROPONINI" in the last 168 hours. BNP (last 3 results) No results for input(s): "PROBNP" in the last 8760 hours. HbA1C: No results for input(s): "HGBA1C" in the last 72 hours. CBG: No results for input(s): "GLUCAP" in the last 168 hours. Lipid Profile: No results for  input(s): "CHOL", "HDL", "LDLCALC", "TRIG", "CHOLHDL", "LDLDIRECT" in the last 72 hours. Thyroid Function Tests: No results for input(s): "TSH", "T4TOTAL", "FREET4", "T3FREE", "THYROIDAB" in the last 72 hours. Anemia Panel: No results for input(s): "VITAMINB12", "FOLATE", "FERRITIN", "TIBC", "IRON", "RETICCTPCT" in the last 72 hours. Sepsis Labs: Recent Labs  Lab 05/30/22 0258 05/30/22 0428  PROCALCITON <0.10  --   LATICACIDVEN  --  1.3    Recent Results (from the past 240 hour(s))  Resp panel by RT-PCR (RSV, Flu A&B, Covid) Anterior Nasal Swab     Status: None   Collection Time: 05/30/22  2:58 AM   Specimen: Anterior Nasal Swab  Result Value Ref Range Status   SARS Coronavirus 2 by RT PCR NEGATIVE NEGATIVE Final    Comment: (NOTE) SARS-CoV-2 target nucleic acids are NOT DETECTED.  The SARS-CoV-2 RNA is generally detectable in upper respiratory specimens during the acute phase of infection. The lowest concentration of SARS-CoV-2 viral copies this assay can detect is 138 copies/mL. A negative result does not preclude SARS-Cov-2 infection and should not be used as the sole basis for treatment or other patient management decisions. A negative result may occur with  improper specimen collection/handling, submission of specimen other than nasopharyngeal swab, presence of viral mutation(s) within the areas targeted by this assay, and inadequate number of viral copies(<138 copies/mL). A negative result must be combined with clinical observations, patient history, and epidemiological information. The expected result is Negative.  Fact Sheet for Patients:  EntrepreneurPulse.com.au  Fact Sheet for Healthcare Providers:  IncredibleEmployment.be  This test is no t yet approved or cleared by the [place] FDA and  has been authorized for detection and/or diagnosis of SARS-CoV-2 by FDA under an Emergency Use Authorization (EUA). This EUA will  remain  in effect (meaning this test can be used) for the duration of the COVID-19 declaration under Section 564(b)(1) of the Act, 21 U.S.C.section 360bbb-3(b)(1), unless the authorization is terminated  or revoked sooner.       Influenza A by PCR NEGATIVE NEGATIVE Final   Influenza B by PCR NEGATIVE NEGATIVE Final    Comment: (NOTE) The Xpert Xpress SARS-CoV-2/FLU/RSV plus assay is intended as an aid in the diagnosis of influenza from Nasopharyngeal swab specimens and should not be used as a sole basis for treatment. Nasal washings and aspirates are unacceptable for Xpert Xpress SARS-CoV-2/FLU/RSV testing.  Fact Sheet for Patients: EntrepreneurPulse.com.au  Fact Sheet for Healthcare Providers: IncredibleEmployment.be  This test is not yet approved or cleared by the [place] FDA and has been authorized for detection and/or diagnosis of SARS-CoV-2 by FDA under an Emergency Use Authorization (EUA). This EUA will remain in effect (meaning this test can be used) for the duration of the COVID-19 declaration under Section 564(b)(1) of the Act, 21 U.S.C. section 360bbb-3(b)(1), unless the authorization is terminated or revoked.     Resp Syncytial Virus by PCR NEGATIVE NEGATIVE Final    Comment: (NOTE) Fact Sheet for Patients: EntrepreneurPulse.com.au  Fact Sheet for Healthcare Providers: IncredibleEmployment.be  This test is  not yet approved or cleared by the [place] and has been authorized for detection and/or diagnosis of SARS-CoV-2 by FDA under an Emergency Use Authorization (EUA). This EUA will remain in effect (meaning this test can be used) for the duration of the COVID-19 declaration under Section 564(b)(1) of the Act, 21 U.S.C. section 360bbb-3(b)(1), unless the authorization is terminated or revoked.  Performed at [facility], [address] [place], [place] [address]    Blood culture (routine x 2)     Status: None (Preliminary result)   Collection Time: 05/30/22  4:28 AM   Specimen: BLOOD  Result Value Ref Range Status   Specimen Description BLOOD BLOOD RIGHT ARM  Final   Special Requests   Final    BOTTLES DRAWN AEROBIC AND ANAEROBIC Blood Culture results may not be optimal due to an excessive volume of blood received in culture bottles   Culture   Final    NO GROWTH 1 DAY Performed at [facility], [address] [place], [place] [address]    Report Status PENDING  Incomplete  Culture, Respiratory w Gram Stain     Status: None (Preliminary result)   Collection Time: 05/30/22  4:28 AM   Specimen: Tracheal Aspirate  Result Value Ref Range Status   Specimen Description   Final    TRACHEAL ASPIRATE Performed at [facility], [address] [place], [place] [address]    Special Requests   Final    NONE Performed at [place], [place] [address]    Gram Stain   Final    RARE WBC PRESENT,BOTH PMN AND MONONUCLEAR RARE GRAM NEGATIVE COCCI RARE GRAM POSITIVE COCCI IN PAIRS RARE GRAM POSITIVE RODS Performed at [facility], [place] [address] [place], [place] [address]    Culture PENDING  Incomplete   Report Status PENDING  Incomplete  Culture, Respiratory w Gram Stain     Status: None (Preliminary result)   Collection Time: 05/30/22  6:06 AM   Specimen: Tracheal Aspirate  Result Value Ref Range Status   Specimen Description   Final    TRACHEAL ASPIRATE Performed at [facility], [address], [place], [place] [address]    Special Requests   Final    NONE Performed at [facility], [address] [place], [place] [address]    Gram Stain   Final    MODERATE WBC PRESENT,BOTH PMN AND MONONUCLEAR FEW GRAM POSITIVE COCCI IN CHAINS RARE GRAM NEGATIVE RODS FEW GRAM NEGATIVE COCCI RARE GRAM POSITIVE RODS Performed at [facility], [place] [address]  [place], [place] [address]    Culture PENDING  Incomplete   Report Status PENDING  Incomplete  Culture, Respiratory w Gram Stain     Status: None (Preliminary result)   Collection Time: 05/30/22  2:45 PM   Specimen: Tracheal Aspirate  Result Value Ref Range Status   Specimen Description   Final    TRACHEAL ASPIRATE Performed at [facility], [place] [address] [place], [place] [address]    Special Requests   Final    NONE Performed at [facility], [place], [place] [address]    Gram Stain   Final    ABUNDANT WBC PRESENT, PREDOMINANTLY PMN ABUNDANT GRAM NEGATIVE RODS FEW GRAM POSITIVE COCCI Performed at [facility], [place] [address], [place], [place] [address]    Culture PENDING  Incomplete   Report Status  PENDING  Incomplete  Culture, blood (Routine X 2) w Reflex to ID Panel     Status: None (Preliminary result)   Collection Time: 05/31/22  4:28 AM   Specimen: BLOOD  Result Value Ref Range Status   Specimen Description BLOOD BLOOD LEFT HAND  Final   Special Requests   Final    BOTTLES DRAWN AEROBIC AND ANAEROBIC Blood Culture results may not be optimal due to an excessive volume of blood received in culture bottles   Culture   Final    NO GROWTH < 12 HOURS Performed at [facility], [address], [place], [place] [address]    Report Status PENDING  Incomplete         Radiology Studies: DG Chest Portable 1 View  Result Date: 05/30/2022 CLINICAL DATA:  Shortness of breath EXAM: PORTABLE CHEST 1 VIEW COMPARISON:  None Available. FINDINGS: The heart size and mediastinal contours are within normal limits. Both lungs are clear. The visualized skeletal structures are unremarkable. Tracheostomy tube tip is at the level of the clavicular heads. IMPRESSION: No active disease. Electronically Signed   By: Ulyses Jarred M.D.   On: 05/30/2022 02:51        Scheduled Meds:  apixaban  5 mg Per Tube BID   atropine  2 drop Sublingual QID    carvedilol  6.25 mg Per Tube BID WC   chlorhexidine gluconate (MEDLINE KIT)  15 mL Mouth Rinse BID   doxazosin  2 mg Per Tube QHS   famotidine  20 mg Per Tube BID   feeding supplement (PROSource TF20)  60 mL Per Tube Daily   fluticasone  2 spray Each Nare Daily   free water  30 mL Per Tube Q4H   glycopyrrolate  1 mg Per Tube TID   guaiFENesin  15 mL Per Tube Q6H   hydrALAZINE  50 mg Per Tube TID   ipratropium-albuterol  3 mL Nebulization Q6H   isosorbide dinitrate  30 mg Per Tube TID   levETIRAcetam  750 mg Per Tube BID   liver oil-zinc oxide  1 Application Topical BID   metoCLOPramide  10 mg Per Tube Q6H   mouth rinse  15 mL Mouth Rinse Q4H   scopolamine  1 patch Transdermal Q72H   spironolactone  25 mg Per Tube Daily   valproic acid  750 mg Per Tube TID   Continuous Infusions:  azithromycin 500 mg (05/31/22 0527)   cefTRIAXone (ROCEPHIN)  IV 2 g (05/30/22 1426)   feeding supplement (OSMOLITE 1.5 CAL) 1,000 mL (05/30/22 1634)     LOS: 1 day   Time spent= 35 mins    Dois Juarbe Arsenio Loader, MD [facility]  If 7PM-7AM, please contact night-coverage  05/31/2022, 8:13 AM

## 2022-05-31 NOTE — Progress Notes (Signed)
MEWS Progress Note  Patient Details Name: Billy Cherry MRN: [id number] DOB: Nov 02, 1970 Today's Date: 05/31/2022   MEWS Flowsheet Documentation:  Assess: MEWS Score Temp: 98.7 F (37.1 C) BP: 93/61 MAP (mmHg): 70 Pulse Rate: (!) 103 ECG Heart Rate: 97 Resp: 18 Level of Consciousness: Responds to Pain SpO2: 99 % O2 Device: Tracheostomy Collar O2 Flow Rate (L/min): 9 L/min FiO2 (%): 30 % Assess: MEWS Score MEWS Temp: 0 MEWS Systolic: 1 MEWS Pulse: 1 MEWS RR: 0 MEWS LOC: 2 MEWS Score: 4 MEWS Score Color: Red Assess: SIRS CRITERIA SIRS Temperature : 0 SIRS Respirations : 0 SIRS Pulse: 1 SIRS WBC: 0 SIRS Score Sum : 1 SIRS Temperature : 0 SIRS Pulse: 1 SIRS Respirations : 0 SIRS WBC: 0 SIRS Score Sum : 1 Assess: if the MEWS score is Yellow or Red Were vital signs taken at a resting state?: Yes Focused Assessment: No change from prior assessment Does the patient meet 2 or more of the SIRS criteria?: No MEWS guidelines implemented : Yes, red Treat MEWS Interventions: Considered administering scheduled or prn medications/treatments as ordered Take Vital Signs Increase Vital Sign Frequency : Red: Q1hr x2, continue Q4hrs until patient remains green for 12hrs Escalate MEWS: Escalate: Red: Discuss with charge nurse and notify provider. Consider notifying RRT. If remains red for 2 hours consider need for higher level of care Notify: Charge Nurse/RN Name of Charge Nurse/RN Notified: Billy aid/development worker, RN Provider Notification Provider Name/Title: Sharion Settler, NP Date Provider Notified: 05/31/22 Time Provider Notified: [phone] Method of Notification: Page Notification Reason: Other (Comment) (Red MEWS for LOC, HR and BP)      Billy Cherry 05/31/2022, 3:59 AM

## 2022-06-01 ENCOUNTER — Inpatient Hospital Stay: Payer: BC Managed Care – PPO

## 2022-06-01 ENCOUNTER — Other Ambulatory Visit: Payer: Self-pay | Admitting: Family Medicine

## 2022-06-01 DIAGNOSIS — G40909 Epilepsy, unspecified, not intractable, without status epilepticus: Secondary | ICD-10-CM

## 2022-06-01 DIAGNOSIS — J209 Acute bronchitis, unspecified: Secondary | ICD-10-CM | POA: Diagnosis not present

## 2022-06-01 LAB — CULTURE, RESPIRATORY W GRAM STAIN

## 2022-06-01 LAB — CBC
HCT: 32.1 % — ABNORMAL LOW (ref 39.0–52.0)
Hemoglobin: 9.8 g/dL — ABNORMAL LOW (ref 13.0–17.0)
MCH: 32.2 pg (ref 26.0–34.0)
MCHC: 30.5 g/dL (ref 30.0–36.0)
MCV: 105.6 fL — ABNORMAL HIGH (ref 80.0–100.0)
Platelets: UNDETERMINED K/uL (ref 150–400)
RBC: 3.04 MIL/uL — ABNORMAL LOW (ref 4.22–5.81)
RDW: 14.1 % (ref 11.5–15.5)
WBC: 8.5 K/uL (ref 4.0–10.5)
nRBC: 0 % (ref 0.0–0.2)

## 2022-06-01 LAB — BASIC METABOLIC PANEL
Anion gap: 9 (ref 5–15)
BUN: 44 mg/dL — ABNORMAL HIGH (ref 6–20)
CO2: 27 mmol/L (ref 22–32)
Calcium: 9.3 mg/dL (ref 8.9–10.3)
Chloride: 101 mmol/L (ref 98–111)
Creatinine, Ser: 1.23 mg/dL (ref 0.61–1.24)
GFR, Estimated: >60 mL/min (ref >60)
Glucose, Bld: 102 mg/dL — ABNORMAL HIGH (ref 70–99)
Potassium: 4.8 mmol/L (ref 3.5–5.1)
Sodium: 137 mmol/L (ref 135–145)

## 2022-06-01 LAB — PHOSPHORUS: Phosphorus: 4 mg/dL (ref 2.5–4.6)

## 2022-06-01 LAB — MAGNESIUM: Magnesium: 2 mg/dL (ref 1.7–2.4)

## 2022-06-01 MED ORDER — SODIUM CHLORIDE 0.9 % IV SOLN
2.0000 g | Freq: Three times a day (TID) | INTRAVENOUS | Status: DC
  Administered 2022-06-01 – 2022-06-05 (×12): 2 g via INTRAVENOUS
  Filled 2022-06-01 (×13): qty 2

## 2022-06-01 MED ORDER — SENNOSIDES-DOCUSATE SODIUM 8.6-50 MG PO TABS
1.0000 | ORAL_TABLET | Freq: Two times a day (BID) | ORAL | Status: DC
  Administered 2022-06-01 – 2022-06-05 (×6): 1
  Filled 2022-06-01 (×7): qty 1

## 2022-06-01 MED ORDER — IPRATROPIUM-ALBUTEROL 0.5-2.5 (3) MG/3ML IN SOLN
3.0000 mL | Freq: Three times a day (TID) | RESPIRATORY_TRACT | Status: DC
  Administered 2022-06-01 – 2022-06-05 (×11): 3 mL via RESPIRATORY_TRACT
  Filled 2022-06-01 (×10): qty 3

## 2022-06-01 MED ORDER — OSMOLITE 1.5 CAL PO LIQD
1000.0000 mL | ORAL | Status: DC
  Administered 2022-06-01 – 2022-06-03 (×3): 1000 mL

## 2022-06-01 MED ORDER — LACTULOSE 10 GM/15ML PO SOLN
30.0000 g | Freq: Three times a day (TID) | ORAL | Status: AC
  Administered 2022-06-01 – 2022-06-02 (×3): 30 g
  Filled 2022-06-01 (×3): qty 60

## 2022-06-01 NOTE — Progress Notes (Signed)
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  Chest x-ray was overall unremarkable.  Procalcitonin, BNP negative.  COVID/flu/RSV were negative.  Tube feeds restarted at a slower rate.  Eventually tracheal cultures were growing Pseudomonas therefore antibiotics were adjusted   Assessment & Plan:  Principal Problem:   Acute tracheobronchitis Active Problems:   Acute on chronic respiratory failure with hypoxia ([name])   Essential hypertension   Seizure disorder ([name])   Anxiety   GERD without esophagitis    Assessment and Plan: * Acute tracheobronchitis, Pseudomonas - BNP, procalcitonin, flu, RSV, COVID-19-negative.   Currently on bronchodilators scheduled and as needed.    Sputum was on respiratory cultures therefore adjust antibiotics.  Continue azithromycin, add [place] [facility] staff to reduce his tube feed rate  Acute on chronic respiratory failure with hypoxia ([name]) History of congestive CHF with EF 30% - Is secondary to #1 and possibly due to mucous plug. - O2 protocol will be followed. - Management otherwise as above.  Essential hypertension - We will continue his antihypertensives.  Seizure disorder ([place]) - Keppra   GERD without esophagitis - We will continue PPI therapy  Anxiety - We will continue his Klonopin.   DVT prophylaxis: Eliquis Code Status: Full  Family Communication:   Status is: Inpatient Remains inpatient appropriate because: Cont hospital stay. Still on 9L [place] via trach    Subjective: No acute distress this morning.  Overnight he did have episode of coughing and vomiting his tube feeds. Examination: Constitutional: Sitting up  with the eyes open but remains unresponsive.  5 L nasal cannula attached to his trach Respiratory: Very mild rhonchi heard anteriorly Cardiovascular: Normal sinus rhythm, no rubs Abdomen: Nontender nondistended good bowel sounds Musculoskeletal: No edema noted Skin: No rashes seen Neurologic: Unable to assess Psychiatric: Unable to assess   Objective: Vitals:   06/01/22 0630 06/01/22 0728 06/01/22 0819 06/01/22 1157  BP:  (!) 114/101 115/87 135/85  Pulse:  98 88 98  Resp:  (!) 22 20 20   Temp:  (!) 97.5 F (36.4 C)  98.6 F (37 C)  TempSrc:    Oral  SpO2: 98% 95% 99% 98%  Weight:      Height:        Intake/Output Summary (Last 24 hours) at 06/01/2022 1321 Last data filed at 06/01/2022 0600 Gross per 24 hour  Intake 2088 ml  Output 600 ml  Net 1488 ml   Filed Weights   05/30/22 0723 05/31/22 0437 06/01/22 0500  Weight: 104.6 kg 105.7 kg 105.8 kg     Data Reviewed:   CBC: Recent Labs  Lab 05/30/22 0258 05/31/22 0428 06/01/22 0635  WBC 11.2* 8.8 8.5  NEUTROABS 7.3  --   --   HGB 10.9* 10.2* 9.8*  HCT 35.9* 33.2* 32.1*  MCV 105.3* 104.1* 105.6*  PLT 208 207 PLATELET CLUMPS NOTED ON SMEAR, UNABLE TO ESTIMATE   Basic Metabolic Panel: Recent Labs  Lab 05/30/22 0258 05/31/22 0428 06/01/22 0635  NA 138 141 137  K 5.0 5.0 4.8  CL 98 102 101  CO2 31 30 27   GLUCOSE 139* 137* 102*  BUN 42* 42* 44*  CREATININE 1.18 1.21 1.23  CALCIUM 9.7 9.5 9.3  MG  --  1.9 2.0  PHOS  --  4.6 4.0   GFR: Estimated Creatinine Clearance: 89.3 mL/min (by C-G formula based on SCr of 1.23 mg/dL). Liver Function Tests: No results for input(s): "AST", "ALT", "ALKPHOS", "BILITOT", "PROT", "ALBUMIN" in the last 168 hours. No results for input(s): "LIPASE", "AMYLASE" in the last 168 hours. No results for input(s): "AMMONIA" in the last 168 hours. Coagulation Profile: No results for input(s): "INR", "PROTIME" in the last 168 hours. Cardiac Enzymes: No results for input(s):  "CKTOTAL", "CKMB", "CKMBINDEX", "TROPONINI" in the last 168 hours. BNP (last 3 results) No results for input(s): "PROBNP" in the last 8760 hours. HbA1C: No results for input(s): "HGBA1C" in the last 72 hours. CBG: No results for input(s): "GLUCAP" in the last 168 hours. Lipid Profile: No results for input(s): "CHOL", "HDL", "LDLCALC", "TRIG", "CHOLHDL", "LDLDIRECT" in the last 72 hours. Thyroid Function Tests: No results for input(s): "TSH", "T4TOTAL", "FREET4", "T3FREE", "THYROIDAB" in the last 72 hours. Anemia Panel: No results for input(s): "VITAMINB12", "FOLATE", "FERRITIN", "TIBC", "IRON", "RETICCTPCT" in the last 72 hours. Sepsis Labs: Recent Labs  Lab 05/30/22 0258 05/30/22 0428  PROCALCITON <0.10  --   LATICACIDVEN  --  1.3    Recent Results (from the past 240 hour(s))  Resp panel by RT-PCR (RSV, Flu A&B, Covid) Anterior Nasal Swab     Status: None   Collection Time: 05/30/22  2:58 AM   Specimen: Anterior Nasal Swab  Result Value Ref Range Status   SARS Coronavirus 2 by RT PCR NEGATIVE NEGATIVE Final    Comment: (NOTE) SARS-CoV-2 target nucleic acids are NOT DETECTED.  The SARS-CoV-2 RNA is generally detectable in upper respiratory specimens during the acute phase of infection. The lowest concentration of SARS-CoV-2 viral copies this assay can detect is 138 copies/mL. A negative result does not preclude SARS-Cov-2 infection and should not be used as the sole basis for treatment or other patient management decisions. A negative result may occur with  improper specimen collection/handling, submission of specimen other than nasopharyngeal swab, presence of viral mutation(s) within the areas targeted by this assay, and inadequate number of viral copies(<138 copies/mL). A negative result must be combined with clinical observations, patient history, and epidemiological information. The expected result is Negative.  Fact Sheet for Patients:   EntrepreneurPulse.com.au  Fact Sheet for Healthcare Providers:  IncredibleEmployment.be  This test is no t yet approved or cleared by the [place] FDA and  has been authorized for detection and/or diagnosis of SARS-CoV-2 by FDA under an Emergency Use Authorization (EUA). This EUA will remain  in effect (meaning this test can be used) for the duration of the COVID-19 declaration under Section 564(b)(1) of the Act, 21 U.S.C.section 360bbb-3(b)(1), unless the authorization is terminated  or revoked sooner.       Influenza A by PCR NEGATIVE NEGATIVE Final   Influenza B by PCR NEGATIVE NEGATIVE Final    Comment: (NOTE) The Xpert Xpress SARS-CoV-2/FLU/RSV plus assay is intended as an aid in the diagnosis of influenza from Nasopharyngeal swab specimens and should not be used as a sole basis for treatment. Nasal washings and aspirates are unacceptable for Xpert Xpress SARS-CoV-2/FLU/RSV testing.  Fact Sheet for Patients: EntrepreneurPulse.com.au  Fact Sheet for Healthcare Providers: IncredibleEmployment.be  This test is not yet approved or cleared by the [place] FDA and has been authorized for detection and/or diagnosis of SARS-CoV-2 by FDA under an Emergency Use Authorization (EUA). This EUA will remain in effect (meaning this test can be used) for the duration  of the COVID-19 declaration under Section 564(b)(1) of the Act, 21 U.S.C. section 360bbb-3(b)(1), unless the authorization is terminated or revoked.     Resp Syncytial Virus by PCR NEGATIVE NEGATIVE Final    Comment: (NOTE) Fact Sheet for Patients: EntrepreneurPulse.com.au  Fact Sheet for Healthcare Providers: IncredibleEmployment.be  This test is not yet approved or cleared by the [place] FDA and has been authorized for detection and/or diagnosis of SARS-CoV-2 by FDA under an Emergency Use  Authorization (EUA). This EUA will remain in effect (meaning this test can be used) for the duration of the COVID-19 declaration under Section 564(b)(1) of the Act, 21 U.S.C. section 360bbb-3(b)(1), unless the authorization is terminated or revoked.  Performed at [facility], [address] [place], [place] [address]   Blood culture (routine x 2)     Status: None (Preliminary result)   Collection Time: 05/30/22  4:28 AM   Specimen: BLOOD  Result Value Ref Range Status   Specimen Description BLOOD BLOOD RIGHT ARM  Final   Special Requests   Final    BOTTLES DRAWN AEROBIC AND ANAEROBIC Blood Culture results may not be optimal due to an excessive volume of blood received in culture bottles   Culture   Final    NO GROWTH 2 DAYS Performed at [facility], [address] [place], [place] [address]    Report Status PENDING  Incomplete  Culture, Respiratory w Gram Stain     Status: None (Preliminary result)   Collection Time: 05/30/22  4:28 AM   Specimen: Tracheal Aspirate  Result Value Ref Range Status   Specimen Description   Final    TRACHEAL ASPIRATE Performed at [facility], [address], [place], [place] [address]    Special Requests   Final    NONE Performed at [facility], [place], [place] [address]    Gram Stain   Final    RARE WBC PRESENT,BOTH PMN AND MONONUCLEAR RARE GRAM NEGATIVE COCCI RARE GRAM POSITIVE COCCI IN PAIRS RARE GRAM POSITIVE RODS    Culture   Final    FEW GRAM NEGATIVE RODS CULTURE REINCUBATED FOR BETTER GROWTH Performed at [facility], [place] [address] [place], [place] [address]    Report Status PENDING  Incomplete  Culture, Respiratory w Gram Stain     Status: None   Collection Time: 05/30/22  6:06 AM   Specimen: Tracheal Aspirate  Result Value Ref Range Status   Specimen Description   Final    TRACHEAL ASPIRATE Performed at [facility], [address],  [place], [place] [address]    Special Requests   Final    NONE Performed at [facility], [place], [place] [address]    Gram Stain   Final    MODERATE WBC PRESENT,BOTH PMN AND MONONUCLEAR FEW GRAM POSITIVE COCCI IN CHAINS RARE GRAM NEGATIVE RODS FEW GRAM NEGATIVE COCCI RARE GRAM POSITIVE RODS Performed at [facility], [address] [place], [place] [address]    Culture ABUNDANT PSEUDOMONAS AERUGINOSA  Final   Report Status 06/01/2022 FINAL  Final   Organism ID, Bacteria PSEUDOMONAS AERUGINOSA  Final      Susceptibility   Pseudomonas aeruginosa - MIC*    CEFTAZIDIME 2 SENSITIVE Sensitive     CIPROFLOXACIN 1 INTERMEDIATE Intermediate     GENTAMICIN <=1 SENSITIVE Sensitive     IMIPENEM 2 SENSITIVE Sensitive     CEFEPIME 1 SENSITIVE Sensitive     *  ABUNDANT PSEUDOMONAS AERUGINOSA  Culture, Respiratory w Gram Stain     Status: None (Preliminary result)   Collection Time: 05/30/22  2:45 PM   Specimen: Tracheal Aspirate  Result Value Ref Range Status   Specimen Description   Final    TRACHEAL ASPIRATE Performed at [facility], [address], [place], [place] [address]    Special Requests   Final    NONE Performed at [facility], [place], [place] [address]    Gram Stain   Final    ABUNDANT WBC PRESENT, PREDOMINANTLY PMN ABUNDANT GRAM NEGATIVE RODS FEW GRAM POSITIVE COCCI    Culture   Final    RARE PSEUDOMONAS AERUGINOSA SUSCEPTIBILITIES TO FOLLOW Performed at [facility], [place] [address] [place], [place] [address]    Report Status PENDING  Incomplete  Culture, blood (Routine X 2) w Reflex to ID Panel     Status: None (Preliminary result)   Collection Time: 05/31/22  4:28 AM   Specimen: BLOOD  Result Value Ref Range Status   Specimen Description BLOOD BLOOD LEFT HAND  Final   Special Requests   Final    BOTTLES DRAWN AEROBIC AND ANAEROBIC Blood Culture results may not be optimal due to an excessive  volume of blood received in culture bottles   Culture   Final    NO GROWTH 1 DAY Performed at [facility], [address] [place], [place] [address]    Report Status PENDING  Incomplete  Respiratory (~20 pathogens) panel by PCR     Status: None   Collection Time: 05/31/22  6:25 PM   Specimen: Nasopharyngeal Swab; Respiratory  Result Value Ref Range Status   Adenovirus NOT DETECTED NOT DETECTED Final   Coronavirus 229E NOT DETECTED NOT DETECTED Final    Comment: (NOTE) The Coronavirus on the Respiratory Panel, DOES NOT test for the novel  Coronavirus (2019 nCoV)    Coronavirus HKU1 NOT DETECTED NOT DETECTED Final   Coronavirus NL63 NOT DETECTED NOT DETECTED Final   Coronavirus OC43 NOT DETECTED NOT DETECTED Final   Metapneumovirus NOT DETECTED NOT DETECTED Final   Rhinovirus / Enterovirus NOT DETECTED NOT DETECTED Final   Influenza A NOT DETECTED NOT DETECTED Final   Influenza B NOT DETECTED NOT DETECTED Final   Parainfluenza Virus 1 NOT DETECTED NOT DETECTED Final   Parainfluenza Virus 2 NOT DETECTED NOT DETECTED Final   Parainfluenza Virus 3 NOT DETECTED NOT DETECTED Final   Parainfluenza Virus 4 NOT DETECTED NOT DETECTED Final   Respiratory Syncytial Virus NOT DETECTED NOT DETECTED Final   Bordetella pertussis NOT DETECTED NOT DETECTED Final   Bordetella Parapertussis NOT DETECTED NOT DETECTED Final   Chlamydophila pneumoniae NOT DETECTED NOT DETECTED Final   Mycoplasma pneumoniae NOT DETECTED NOT DETECTED Final    Comment: Performed at [facility], [place] [address], [place], [place] [address]         Radiology Studies: No results found.      Scheduled Meds:  apixaban  5 mg Per Tube BID   atropine  2 drop Sublingual QID   carvedilol  6.25 mg Per Tube BID WC   chlorhexidine gluconate (MEDLINE KIT)  15 mL Mouth Rinse BID   doxazosin  2 mg Per Tube QHS   famotidine  20 mg Per Tube BID   feeding supplement (PROSource TF20)  60 mL Per Tube Daily    fluticasone  2 spray Each Nare Daily   free water  30 mL Per Tube Q4H   glycopyrrolate  1 mg Per Tube TID   guaiFENesin  15 mL Per Tube Q6H   hydrALAZINE  50 mg Per Tube TID   ipratropium-albuterol  3 mL Nebulization Q6H   isosorbide dinitrate  30 mg Per Tube TID   levETIRAcetam  750 mg Per Tube BID   liver oil-zinc oxide  1 Application Topical BID   metoCLOPramide  10 mg Per Tube Q6H   mouth rinse  15 mL Mouth Rinse Q4H   polyethylene glycol  17 g Per Tube BID   scopolamine  1 patch Transdermal Q72H   spironolactone  25 mg Per Tube Daily   valproic acid  750 mg Per Tube TID   Continuous Infusions:  azithromycin 500 mg (06/01/22 0523)   cefTAZidime (FORTAZ)  IV     feeding supplement (OSMOLITE 1.5 CAL) 1,000 mL (06/01/22 0915)     LOS: 2 days   Time spent= 35 mins    Allayna Erlich Arsenio Loader, MD [facility]  If 7PM-7AM, please contact night-coverage  06/01/2022, 1:21 PM

## 2022-06-01 NOTE — Progress Notes (Signed)
Trach suctioned for sm amt of thick tan secretions

## 2022-06-02 DIAGNOSIS — J209 Acute bronchitis, unspecified: Secondary | ICD-10-CM | POA: Diagnosis not present

## 2022-06-02 LAB — CBC
HCT: 33.5 % — ABNORMAL LOW (ref 39.0–52.0)
Hemoglobin: 10.5 g/dL — ABNORMAL LOW (ref 13.0–17.0)
MCH: 32.2 pg (ref 26.0–34.0)
MCHC: 31.3 g/dL (ref 30.0–36.0)
MCV: 102.8 fL — ABNORMAL HIGH (ref 80.0–100.0)
Platelets: 229 10*3/uL (ref 150–400)
RBC: 3.26 MIL/uL — ABNORMAL LOW (ref 4.22–5.81)
RDW: 14 % (ref 11.5–15.5)
WBC: 7.7 10*3/uL (ref 4.0–10.5)
nRBC: 0 % (ref 0.0–0.2)

## 2022-06-02 LAB — CULTURE, RESPIRATORY W GRAM STAIN

## 2022-06-02 LAB — MAGNESIUM: Magnesium: 2.1 mg/dL (ref 1.7–2.4)

## 2022-06-02 LAB — BASIC METABOLIC PANEL
Anion gap: 14 (ref 5–15)
BUN: 38 mg/dL — ABNORMAL HIGH (ref 6–20)
CO2: 28 mmol/L (ref 22–32)
Calcium: 9.9 mg/dL (ref 8.9–10.3)
Chloride: 98 mmol/L (ref 98–111)
Creatinine, Ser: 1.16 mg/dL (ref 0.61–1.24)
GFR, Estimated: >60 mL/min (ref >60)
Glucose, Bld: 73 mg/dL (ref 70–99)
Potassium: 4.5 mmol/L (ref 3.5–5.1)
Sodium: 140 mmol/L (ref 135–145)

## 2022-06-02 LAB — PHOSPHORUS: Phosphorus: 4.8 mg/dL — ABNORMAL HIGH (ref 2.5–4.6)

## 2022-06-02 MED ORDER — ONDANSETRON HCL 4 MG/5ML PO SOLN: 4.0000 mg | Freq: Four times a day (QID) | ORAL | Status: DC | PRN

## 2022-06-02 MED ORDER — METOCLOPRAMIDE HCL 10 MG/10ML PO SOLN
10.0000 mg | Freq: Three times a day (TID) | ORAL | Status: DC
  Administered 2022-06-03 – 2022-06-05 (×8): 10 mg
  Filled 2022-06-02 (×10): qty 10

## 2022-06-02 NOTE — Progress Notes (Signed)
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  Chest x-ray was overall unremarkable.  Procalcitonin, BNP negative.  COVID/flu/RSV were negative.  Tube feeds restarted at a slower rate.  Eventually tracheal cultures were growing Pseudomonas therefore antibiotics were adjusted   Assessment & Plan:  Principal Problem:   Acute tracheobronchitis Active Problems:   Acute on chronic respiratory failure with hypoxia ([name])   Essential hypertension   Seizure disorder ([name])   Anxiety   GERD without esophagitis    Assessment and Plan: * Acute tracheobronchitis, Pseudomonas Anoxic brain injury status post tracheostomy/PEG - BNP, procalcitonin, flu, RSV, COVID-19-negative.   Currently on bronchodilators scheduled and as needed.    Sputum was on respiratory cultures therefore adjust antibiotics.  Continue azithromycin, add Fortaz Intermittent vomiting, tube feeds paused KUB= looks ok.   Acute on chronic respiratory failure with hypoxia ([name]) History of congestive CHF with EF 30% - Monitor volume status.  Supportive care. -Continue Coreg, Cardura, isosorbide, Aldactone  Essential hypertension - We will continue his antihypertensives.  History of DVT - On Eliquis  Seizure disorder ([place]) - Keppra   GERD without esophagitis - We will continue PPI therapy  Anxiety - We will continue his Klonopin.   DVT prophylaxis: Eliquis Code Status: Full  Family Communication:   Status is: Inpatient Remains inpatient appropriate because: Cont hospital stay. Still on 5L [place] via trach but recurrent vomiting.     Subjective: Some vomiting overnight of Tfs.    Examination: Constitutional: Not in acute distress; 5l  Respiratory: Clear to auscultation bilaterally Cardiovascular: Normal sinus rhythm, no rubs Abdomen: Nontender nondistended good bowel sounds Musculoskeletal: No edema noted Skin: No rashes seen Neurologic: Unable to assess Psychiatric: Unable to assess   Objective: Vitals:   06/01/22 2107 06/02/22 0000 06/02/22 0350 06/02/22 0500  BP:  115/85 (!) 113/95   Pulse:  96 94   Resp:   16   Temp:  98.3 F (36.8 C) 97.9 F (36.6 C)   TempSrc:  Axillary Axillary   SpO2: 100% 100% 100%   Weight:    106 kg  Height:        Intake/Output Summary (Last 24 hours) at 06/02/2022 0824 Last data filed at 06/02/2022 [id number] Gross per 24 hour  Intake 1155.96 ml  Output 500 ml  Net 655.96 ml   Filed Weights   05/31/22 0437 06/01/22 0500 06/02/22 0500  Weight: 105.7 kg 105.8 kg 106 kg     Data Reviewed:   CBC: Recent Labs  Lab 05/30/22 0258 05/31/22 0428 06/01/22 0635 06/02/22 0355  WBC 11.2* 8.8 8.5 7.7  NEUTROABS 7.3  --   --   --   HGB 10.9* 10.2* 9.8* 10.5*  HCT 35.9* 33.2* 32.1* 33.5*  MCV 105.3* 104.1* 105.6* 102.8*  PLT 208 207 PLATELET CLUMPS NOTED ON SMEAR, UNABLE TO ESTIMATE [id number]   Basic Metabolic Panel: Recent Labs  Lab 05/30/22 0258 05/31/22 0428 06/01/22 0635 06/02/22 0355  NA 138 141 137 140  K 5.0 5.0 4.8 4.5  CL 98 102 101 98  CO2 31 30 27 28   GLUCOSE 139* 137* 102* 73  BUN 42* 42* 44* 38*  CREATININE 1.18 1.21 1.23 1.16  CALCIUM 9.7 9.5 9.3 9.9  MG  --  1.9 2.0 2.1  PHOS  --  4.6 4.0 4.8*   GFR: Estimated Creatinine Clearance: 94.8 mL/min (by C-G formula based on SCr of 1.16 mg/dL). Liver Function Tests: No results for input(s): "AST", "ALT", "ALKPHOS", "BILITOT", "PROT", "ALBUMIN" in the last 168 hours. No results for input(s): "LIPASE", "AMYLASE" in the last 168 hours. No results for input(s): "AMMONIA" in the last 168 hours. Coagulation Profile: No results for input(s): "INR",  "PROTIME" in the last 168 hours. Cardiac Enzymes: No results for input(s): "CKTOTAL", "CKMB", "CKMBINDEX", "TROPONINI" in the last 168 hours. BNP (last 3 results) No results for input(s): "PROBNP" in the last 8760 hours. HbA1C: No results for input(s): "HGBA1C" in the last 72 hours. CBG: No results for input(s): "GLUCAP" in the last 168 hours. Lipid Profile: No results for input(s): "CHOL", "HDL", "LDLCALC", "TRIG", "CHOLHDL", "LDLDIRECT" in the last 72 hours. Thyroid Function Tests: No results for input(s): "TSH", "T4TOTAL", "FREET4", "T3FREE", "THYROIDAB" in the last 72 hours. Anemia Panel: No results for input(s): "VITAMINB12", "FOLATE", "FERRITIN", "TIBC", "IRON", "RETICCTPCT" in the last 72 hours. Sepsis Labs: Recent Labs  Lab 05/30/22 0258 05/30/22 0428  PROCALCITON <0.10  --   LATICACIDVEN  --  1.3    Recent Results (from the past 240 hour(s))  Resp panel by RT-PCR (RSV, Flu A&B, Covid) Anterior Nasal Swab     Status: None   Collection Time: 05/30/22  2:58 AM   Specimen: Anterior Nasal Swab  Result Value Ref Range Status   SARS Coronavirus 2 by RT PCR NEGATIVE NEGATIVE Final    Comment: (NOTE) SARS-CoV-2 target nucleic acids are NOT DETECTED.  The SARS-CoV-2 RNA is generally detectable in upper respiratory specimens during the acute phase of infection. The lowest concentration of SARS-CoV-2 viral copies this assay can detect is 138 copies/mL. A negative result does not preclude SARS-Cov-2 infection and should not be used as the sole basis for treatment or other patient management decisions. A negative result may occur with  improper specimen collection/handling, submission of specimen other than nasopharyngeal swab, presence of viral mutation(s) within the areas targeted by this assay, and inadequate number of viral copies(<138 copies/mL). A negative result must be combined with clinical observations, patient history, and epidemiological information. The expected  result is Negative.  Fact Sheet for Patients:  EntrepreneurPulse.com.au  Fact Sheet for Healthcare Providers:  IncredibleEmployment.be  This test is no t yet approved or cleared by the [place] FDA and  has been authorized for detection and/or diagnosis of SARS-CoV-2 by FDA under an Emergency Use Authorization (EUA). This EUA will remain  in effect (meaning this test can be used) for the duration of the COVID-19 declaration under Section 564(b)(1) of the Act, 21 U.S.C.section 360bbb-3(b)(1), unless the authorization is terminated  or revoked sooner.       Influenza A by PCR NEGATIVE NEGATIVE Final   Influenza B by PCR NEGATIVE NEGATIVE Final    Comment: (NOTE) The Xpert Xpress SARS-CoV-2/FLU/RSV plus assay is intended as an aid in the diagnosis of influenza from Nasopharyngeal swab specimens and should not be used as a sole basis for treatment. Nasal washings and aspirates are unacceptable for Xpert Xpress SARS-CoV-2/FLU/RSV testing.  Fact Sheet for Patients: EntrepreneurPulse.com.au  Fact Sheet for Healthcare Providers: IncredibleEmployment.be  This test is not yet approved or cleared by the [place] FDA and has been authorized for detection and/or diagnosis of SARS-CoV-2 by FDA under an Emergency Use Authorization (EUA). This EUA will remain in effect (meaning this test can be used) for  the duration of the COVID-19 declaration under Section 564(b)(1) of the Act, 21 U.S.C. section 360bbb-3(b)(1), unless the authorization is terminated or revoked.     Resp Syncytial Virus by PCR NEGATIVE NEGATIVE Final    Comment: (NOTE) Fact Sheet for Patients: EntrepreneurPulse.com.au  Fact Sheet for Healthcare Providers: IncredibleEmployment.be  This test is not yet approved or cleared by the [place] FDA and has been authorized for detection and/or diagnosis of  SARS-CoV-2 by FDA under an Emergency Use Authorization (EUA). This EUA will remain in effect (meaning this test can be used) for the duration of the COVID-19 declaration under Section 564(b)(1) of the Act, 21 U.S.C. section 360bbb-3(b)(1), unless the authorization is terminated or revoked.  Performed at [facility], [address] [place], [place] [address]   Blood culture (routine x 2)     Status: None (Preliminary result)   Collection Time: 05/30/22  4:28 AM   Specimen: BLOOD  Result Value Ref Range Status   Specimen Description BLOOD BLOOD RIGHT ARM  Final   Special Requests   Final    BOTTLES DRAWN AEROBIC AND ANAEROBIC Blood Culture results may not be optimal due to an excessive volume of blood received in culture bottles   Culture   Final    NO GROWTH 3 DAYS Performed at [facility], [address] [place], [place] [address]    Report Status PENDING  Incomplete  Culture, Respiratory w Gram Stain     Status: None (Preliminary result)   Collection Time: 05/30/22  4:28 AM   Specimen: Tracheal Aspirate  Result Value Ref Range Status   Specimen Description   Final    TRACHEAL ASPIRATE Performed at [facility], [address], [place], [place] [address]    Special Requests   Final    NONE Performed at [facility], [place], [place] [address]    Gram Stain   Final    RARE WBC PRESENT,BOTH PMN AND MONONUCLEAR RARE GRAM NEGATIVE COCCI RARE GRAM POSITIVE COCCI IN PAIRS RARE GRAM POSITIVE RODS    Culture   Final    FEW GRAM NEGATIVE RODS CULTURE REINCUBATED FOR BETTER GROWTH Performed at [facility], [place] [address], [place], [place] [address]    Report Status PENDING  Incomplete  Culture, Respiratory w Gram Stain     Status: None   Collection Time: 05/30/22  6:06 AM   Specimen: Tracheal Aspirate  Result Value Ref Range Status   Specimen Description   Final    TRACHEAL ASPIRATE Performed at [facility], [address] [place], [place] [address]    Special Requests   Final    NONE Performed at [facility], [place], [place] [address]    Gram Stain   Final    MODERATE WBC PRESENT,BOTH PMN AND MONONUCLEAR FEW GRAM POSITIVE COCCI IN CHAINS RARE GRAM NEGATIVE RODS FEW GRAM NEGATIVE COCCI RARE GRAM POSITIVE RODS Performed at [facility], [address], [place],  [address]    Culture ABUNDANT PSEUDOMONAS AERUGINOSA  Final   Report Status 06/01/2022 FINAL  Final   Organism ID, Bacteria PSEUDOMONAS AERUGINOSA  Final      Susceptibility   Pseudomonas aeruginosa - MIC*    CEFTAZIDIME 2 SENSITIVE Sensitive     CIPROFLOXACIN 1 INTERMEDIATE Intermediate     GENTAMICIN <=1 SENSITIVE Sensitive     IMIPENEM 2 SENSITIVE Sensitive     CEFEPIME 1 SENSITIVE Sensitive     *  ABUNDANT PSEUDOMONAS AERUGINOSA  Culture, Respiratory w Gram Stain     Status: None (Preliminary result)   Collection Time: 05/30/22  2:45 PM   Specimen: Tracheal Aspirate  Result Value Ref Range Status   Specimen Description   Final    TRACHEAL ASPIRATE Performed at [name], [address], [place], [place] [address]    Special Requests   Final    NONE Performed at [facility], [place], [place] [address]    Gram Stain   Final    ABUNDANT WBC PRESENT, PREDOMINANTLY PMN ABUNDANT GRAM NEGATIVE RODS FEW GRAM POSITIVE COCCI    Culture   Final    RARE PSEUDOMONAS AERUGINOSA SUSCEPTIBILITIES TO FOLLOW Performed at [facility], [place] [address], [place], [place] [address]    Report Status PENDING  Incomplete  Culture, blood (Routine X 2) w Reflex to ID Panel     Status: None (Preliminary result)   Collection Time: 05/31/22  4:28 AM   Specimen: BLOOD  Result Value Ref Range Status   Specimen Description BLOOD BLOOD LEFT HAND  Final   Special Requests   Final    BOTTLES DRAWN AEROBIC AND ANAEROBIC Blood Culture results may  not be optimal due to an excessive volume of blood received in culture bottles   Culture   Final    NO GROWTH 2 DAYS Performed at [facility], [address] [place], [place] [address]    Report Status PENDING  Incomplete  Respiratory (~20 pathogens) panel by PCR     Status: None   Collection Time: 05/31/22  6:25 PM   Specimen: Nasopharyngeal Swab; Respiratory  Result Value Ref Range Status   Adenovirus NOT DETECTED NOT DETECTED Final   Coronavirus 229E NOT DETECTED NOT DETECTED Final    Comment: (NOTE) The Coronavirus on the Respiratory Panel, DOES NOT test for the novel  Coronavirus (2019 nCoV)    Coronavirus HKU1 NOT DETECTED NOT DETECTED Final   Coronavirus NL63 NOT DETECTED NOT DETECTED Final   Coronavirus OC43 NOT DETECTED NOT DETECTED Final   Metapneumovirus NOT DETECTED NOT DETECTED Final   Rhinovirus / Enterovirus NOT DETECTED NOT DETECTED Final   Influenza A NOT DETECTED NOT DETECTED Final   Influenza B NOT DETECTED NOT DETECTED Final   Parainfluenza Virus 1 NOT DETECTED NOT DETECTED Final   Parainfluenza Virus 2 NOT DETECTED NOT DETECTED Final   Parainfluenza Virus 3 NOT DETECTED NOT DETECTED Final   Parainfluenza Virus 4 NOT DETECTED NOT DETECTED Final   Respiratory Syncytial Virus NOT DETECTED NOT DETECTED Final   Bordetella pertussis NOT DETECTED NOT DETECTED Final   Bordetella Parapertussis NOT DETECTED NOT DETECTED Final   Chlamydophila pneumoniae NOT DETECTED NOT DETECTED Final   Mycoplasma pneumoniae NOT DETECTED NOT DETECTED Final    Comment: Performed at [facility], [place] [address] [place], [place] [address]         Radiology Studies: DG Abd Portable 1V  Result Date: 06/01/2022 CLINICAL DATA:  Constipation, persistent vegetative state EXAM: PORTABLE ABDOMEN - 1 VIEW COMPARISON:  None Available. FINDINGS: Percutaneous gastrostomy. Nonobstructive pattern of bowel gas. No large burden of stool. No radio-opaque calculi or other significant  radiographic abnormality are seen. IMPRESSION: 1. Nonobstructive pattern of bowel gas. No large burden of stool. 2. Percutaneous gastrostomy. Electronically Signed   By: Delanna Ahmadi M.D.   On: 06/01/2022 16:43        Scheduled Meds:  apixaban  5 mg  Per Tube BID   atropine  2 drop Sublingual QID   carvedilol  6.25 mg Per Tube BID WC   chlorhexidine gluconate (MEDLINE KIT)  15 mL Mouth Rinse BID   doxazosin  2 mg Per Tube QHS   famotidine  20 mg Per Tube BID   feeding supplement (PROSource TF20)  60 mL Per Tube Daily   fluticasone  2 spray Each Nare Daily   free water  30 mL Per Tube Q4H   glycopyrrolate  1 mg Per Tube TID   guaiFENesin  15 mL Per Tube Q6H   hydrALAZINE  50 mg Per Tube TID   ipratropium-albuterol  3 mL Nebulization TID   isosorbide dinitrate  30 mg Per Tube TID   lactulose  30 g Per Tube TID   levETIRAcetam  750 mg Per Tube BID   liver oil-zinc oxide  1 Application Topical BID   metoCLOPramide  10 mg Per Tube Q6H   mouth rinse  15 mL Mouth Rinse Q4H   polyethylene glycol  17 g Per Tube BID   scopolamine  1 patch Transdermal Q72H   senna-docusate  1 tablet Per Tube BID   spironolactone  25 mg Per Tube Daily   valproic acid  750 mg Per Tube TID   Continuous Infusions:  azithromycin 500 mg (06/02/22 0517)   cefTAZidime (FORTAZ)  IV 2 g (06/02/22 0634)   feeding supplement (OSMOLITE 1.5 CAL) 35 mL/hr at 06/02/22 0342     LOS: 3 days   Time spent= 35 mins    Billy Spiewak Arsenio Loader, MD [facility]  If 7PM-7AM, please contact night-coverage  06/02/2022, 8:24 AM

## 2022-06-02 NOTE — Progress Notes (Signed)
Trach suctioned for copious amts of thick white frothy secretions

## 2022-06-02 NOTE — Progress Notes (Signed)
Back pressure from peg tube, keeps disconnecting. 65ml of residual, tube feeds are backing out of tube . Stopped continuous tube feeds. MD notified.

## 2022-06-03 DIAGNOSIS — J209 Acute bronchitis, unspecified: Secondary | ICD-10-CM | POA: Diagnosis not present

## 2022-06-03 LAB — CULTURE, RESPIRATORY W GRAM STAIN

## 2022-06-03 LAB — BASIC METABOLIC PANEL
Anion gap: 8 (ref 5–15)
BUN: 37 mg/dL — ABNORMAL HIGH (ref 6–20)
CO2: 31 mmol/L (ref 22–32)
Calcium: 9.6 mg/dL (ref 8.9–10.3)
Chloride: 102 mmol/L (ref 98–111)
Creatinine, Ser: 1.14 mg/dL (ref 0.61–1.24)
GFR, Estimated: >60 mL/min (ref >60)
Glucose, Bld: 101 mg/dL — ABNORMAL HIGH (ref 70–99)
Potassium: 4.1 mmol/L (ref 3.5–5.1)
Sodium: 141 mmol/L (ref 135–145)

## 2022-06-03 LAB — CBC
HCT: 33.1 % — ABNORMAL LOW (ref 39.0–52.0)
Hemoglobin: 10.3 g/dL — ABNORMAL LOW (ref 13.0–17.0)
MCH: 32 pg (ref 26.0–34.0)
MCHC: 31.1 g/dL (ref 30.0–36.0)
MCV: 102.8 fL — ABNORMAL HIGH (ref 80.0–100.0)
Platelets: 236 10*3/uL (ref 150–400)
RBC: 3.22 MIL/uL — ABNORMAL LOW (ref 4.22–5.81)
RDW: 14 % (ref 11.5–15.5)
WBC: 6.8 10*3/uL (ref 4.0–10.5)
nRBC: 0 % (ref 0.0–0.2)

## 2022-06-03 LAB — MAGNESIUM: Magnesium: 2.2 mg/dL (ref 1.7–2.4)

## 2022-06-03 LAB — PHOSPHORUS: Phosphorus: 4.1 mg/dL (ref 2.5–4.6)

## 2022-06-03 NOTE — Progress Notes (Signed)
Patient emesis x1. MD notified and tube feed rate adjusted back to 40mL/hr.

## 2022-06-03 NOTE — Progress Notes (Signed)
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  Chest x-ray was overall unremarkable.  Procalcitonin, BNP negative.  COVID/flu/RSV were negative.  Tube feeds restarted at a slower rate.  Eventually tracheal cultures were growing Pseudomonas therefore antibiotics were adjusted   Assessment & Plan:  Principal Problem:   Acute tracheobronchitis Active Problems:   Acute on chronic respiratory failure with hypoxia ([name])   Essential hypertension   Seizure disorder ([name])   Anxiety   GERD without esophagitis    Assessment and Plan: * Acute tracheobronchitis, Pseudomonas Anoxic brain injury status post tracheostomy/PEG - BNP, procalcitonin, flu, RSV, COVID-19-negative.   Currently on bronchodilators scheduled and as needed.    Sputum was on respiratory cultures therefore adjust antibiotics.  Continue azithromycin, On Fortaz.  Intermittent vomiting, Scheduled home reglan added. Will Slowly increase TF rates.  KUB= looks ok.    Acute on chronic respiratory failure with hypoxia ([name]) History of congestive CHF with EF 30% - Monitor volume status.  Supportive care. -Continue Coreg, Cardura, isosorbide, Aldactone  Essential hypertension - We will continue his antihypertensives.  History of DVT - On Eliquis  Seizure disorder ([place]) - Keppra   GERD without esophagitis - We will continue PPI therapy  Anxiety Klonopin   DVT prophylaxis: Eliquis Code Status: Full  Family Communication: Called his wife, [id number].  Left a voicemail  Status is: Inpatient Remains inpatient appropriate because: Cont hospital stay. Still on 5L [place] via trach but recurrent vomiting.      Subjective: Seen and examined at bedside.  Patient resting comfortably.  No vomiting overnight  Examination: Constitutional: Not in acute distress; remain on 5 L nasal cannula Respiratory: Clear to auscultation bilaterally Cardiovascular: Normal sinus rhythm, no rubs Abdomen: Nontender nondistended good bowel sounds Musculoskeletal: No edema noted Skin: No rashes seen Neurologic: Unable to assess Psychiatric: Unable to assess   Objective: Vitals:   06/02/22 2042 06/02/22 2052 06/03/22 0452 06/03/22 0753  BP:  (!) 153/110 120/72 (!) 126/99  Pulse:  (!) 105 87 90  Resp:  16 20 20   Temp:  98.6 F (37 C) 97.9 F (36.6 C) 98.3 F (36.8 C)  TempSrc:  Axillary Axillary Axillary  SpO2: 98% 98% 97% 98%  Weight:   106.4 kg   Height:        Intake/Output Summary (Last 24 hours) at 06/03/2022 0823 Last data filed at 06/03/2022 0544 Gross per 24 hour  Intake 1406.25 ml  Output 905 ml  Net 501.25 ml   Filed Weights   06/01/22 0500 06/02/22 0500 06/03/22 0452  Weight: 105.8 kg 106 kg 106.4 kg     Data Reviewed:   CBC: Recent Labs  Lab 05/30/22 0258 05/31/22 0428 06/01/22 0635 06/02/22 0355 06/03/22 0411  WBC 11.2* 8.8 8.5 7.7 6.8  NEUTROABS 7.3  --   --   --   --   HGB 10.9* 10.2* 9.8* 10.5* 10.3*  HCT 35.9* 33.2* 32.1* 33.5* 33.1*  MCV 105.3* 104.1* 105.6* 102.8* 102.8*  PLT 208 207 PLATELET CLUMPS NOTED ON SMEAR, UNABLE TO ESTIMATE 229 [id number]   Basic Metabolic Panel: Recent Labs  Lab 05/30/22 0258 05/31/22 0428 06/01/22 0635 06/02/22 0355 06/03/22 0411  NA 138 141 137 140 141  K 5.0 5.0  4.8 4.5 4.1  CL 98 102 101 98 102  CO2 31 30 27 28 31   GLUCOSE 139* 137* 102* 73 101*  BUN 42* 42* 44* 38* 37*  CREATININE 1.18 1.21 1.23 1.16 1.14  CALCIUM 9.7 9.5 9.3 9.9 9.6  MG  --  1.9 2.0 2.1 2.2  PHOS  --  4.6 4.0 4.8* 4.1   GFR: Estimated Creatinine Clearance: 96.6 mL/min (by C-G formula based on SCr of 1.14 mg/dL). Liver Function Tests: No results for  input(s): "AST", "ALT", "ALKPHOS", "BILITOT", "PROT", "ALBUMIN" in the last 168 hours. No results for input(s): "LIPASE", "AMYLASE" in the last 168 hours. No results for input(s): "AMMONIA" in the last 168 hours. Coagulation Profile: No results for input(s): "INR", "PROTIME" in the last 168 hours. Cardiac Enzymes: No results for input(s): "CKTOTAL", "CKMB", "CKMBINDEX", "TROPONINI" in the last 168 hours. BNP (last 3 results) No results for input(s): "PROBNP" in the last 8760 hours. HbA1C: No results for input(s): "HGBA1C" in the last 72 hours. CBG: No results for input(s): "GLUCAP" in the last 168 hours. Lipid Profile: No results for input(s): "CHOL", "HDL", "LDLCALC", "TRIG", "CHOLHDL", "LDLDIRECT" in the last 72 hours. Thyroid Function Tests: No results for input(s): "TSH", "T4TOTAL", "FREET4", "T3FREE", "THYROIDAB" in the last 72 hours. Anemia Panel: No results for input(s): "VITAMINB12", "FOLATE", "FERRITIN", "TIBC", "IRON", "RETICCTPCT" in the last 72 hours. Sepsis Labs: Recent Labs  Lab 05/30/22 0258 05/30/22 0428  PROCALCITON <0.10  --   LATICACIDVEN  --  1.3    Recent Results (from the past 240 hour(s))  Resp panel by RT-PCR (RSV, Flu A&B, Covid) Anterior Nasal Swab     Status: None   Collection Time: 05/30/22  2:58 AM   Specimen: Anterior Nasal Swab  Result Value Ref Range Status   SARS Coronavirus 2 by RT PCR NEGATIVE NEGATIVE Final    Comment: (NOTE) SARS-CoV-2 target nucleic acids are NOT DETECTED.  The SARS-CoV-2 RNA is generally detectable in upper respiratory specimens during the acute phase of infection. The lowest concentration of SARS-CoV-2 viral copies this assay can detect is 138 copies/mL. A negative result does not preclude SARS-Cov-2 infection and should not be used as the sole basis for treatment or other patient management decisions. A negative result may occur with  improper specimen collection/handling, submission of specimen other than  nasopharyngeal swab, presence of viral mutation(s) within the areas targeted by this assay, and inadequate number of viral copies(<138 copies/mL). A negative result must be combined with clinical observations, patient history, and epidemiological information. The expected result is Negative.  Fact Sheet for Patients:  EntrepreneurPulse.com.au  Fact Sheet for Healthcare Providers:  IncredibleEmployment.be  This test is no t yet approved or cleared by the [place] FDA and  has been authorized for detection and/or diagnosis of SARS-CoV-2 by FDA under an Emergency Use Authorization (EUA). This EUA will remain  in effect (meaning this test can be used) for the duration of the COVID-19 declaration under Section 564(b)(1) of the Act, 21 U.S.C.section 360bbb-3(b)(1), unless the authorization is terminated  or revoked sooner.       Influenza A by PCR NEGATIVE NEGATIVE Final   Influenza B by PCR NEGATIVE NEGATIVE Final    Comment: (NOTE) The Xpert Xpress SARS-CoV-2/FLU/RSV plus assay is intended as an aid in the diagnosis of influenza from Nasopharyngeal swab specimens and should not be used as a sole basis for treatment. Nasal washings and aspirates are unacceptable for Xpert Xpress SARS-CoV-2/FLU/RSV testing.  Fact Sheet for Patients: EntrepreneurPulse.com.au  Fact Sheet for Healthcare Providers: IncredibleEmployment.be  This test is not yet approved or cleared by the [place] FDA and has been authorized for detection and/or diagnosis of SARS-CoV-2 by FDA under an Emergency Use Authorization (EUA). This EUA will remain in effect (meaning this test can be used) for the duration of the COVID-19 declaration under Section 564(b)(1) of the Act, 21 U.S.C. section 360bbb-3(b)(1), unless the authorization is terminated or revoked.     Resp Syncytial Virus by PCR NEGATIVE NEGATIVE Final    Comment:  (NOTE) Fact Sheet for Patients: EntrepreneurPulse.com.au  Fact Sheet for Healthcare Providers: IncredibleEmployment.be  This test is not yet approved or cleared by the [place] FDA and has been authorized for detection and/or diagnosis of SARS-CoV-2 by FDA under an Emergency Use Authorization (EUA). This EUA will remain in effect (meaning this test can be used) for the duration of the COVID-19 declaration under Section 564(b)(1) of the Act, 21 U.S.C. section 360bbb-3(b)(1), unless the authorization is terminated or revoked.  Performed at [facility], [address] [place], [place] [address]   Blood culture (routine x 2)     Status: None (Preliminary result)   Collection Time: 05/30/22  4:28 AM   Specimen: BLOOD  Result Value Ref Range Status   Specimen Description BLOOD BLOOD RIGHT ARM  Final   Special Requests   Final    BOTTLES DRAWN AEROBIC AND ANAEROBIC Blood Culture results may not be optimal due to an excessive volume of blood received in culture bottles   Culture   Final    NO GROWTH 4 DAYS Performed at [facility], [address] [place], [place] [address]    Report Status PENDING  Incomplete  Culture, Respiratory w Gram Stain     Status: None   Collection Time: 05/30/22  4:28 AM   Specimen: Tracheal Aspirate  Result Value Ref Range Status   Specimen Description   Final    TRACHEAL ASPIRATE Performed at [facility], [address], [place], [place] [address]    Special Requests   Final    NONE Performed at [facility], [address] [place], [place] [address]    Gram Stain   Final    RARE WBC PRESENT,BOTH PMN AND MONONUCLEAR RARE GRAM NEGATIVE COCCI RARE GRAM POSITIVE COCCI IN PAIRS RARE GRAM POSITIVE RODS    Culture   Final    FEW PSEUDOMONAS AERUGINOSA SUSCEPTIBILITIES PERFORMED ON PREVIOUS CULTURE WITHIN THE LAST 5 DAYS. Performed at [facility], [place]  [address] [place], [place] [address]    Report Status 06/02/2022 FINAL  Final  Culture, Respiratory w Gram Stain     Status: None   Collection Time: 05/30/22  6:06 AM   Specimen: Tracheal Aspirate  Result Value Ref Range Status   Specimen Description   Final    TRACHEAL ASPIRATE Performed at [facility], [address], [place], [place] [address]    Special Requests   Final    NONE Performed at [facility], [place], [place] [address]    Gram Stain   Final    MODERATE WBC PRESENT,BOTH PMN AND MONONUCLEAR FEW GRAM POSITIVE COCCI IN CHAINS RARE GRAM NEGATIVE RODS FEW GRAM NEGATIVE COCCI RARE GRAM POSITIVE RODS Performed at [facility], [address], [place], [place] [address]    Culture ABUNDANT PSEUDOMONAS AERUGINOSA  Final   Report Status 06/01/2022 FINAL  Final   Organism ID, Bacteria PSEUDOMONAS  AERUGINOSA  Final      Susceptibility   Pseudomonas aeruginosa - MIC*    CEFTAZIDIME 2 SENSITIVE Sensitive     CIPROFLOXACIN 1 INTERMEDIATE Intermediate     GENTAMICIN <=1 SENSITIVE Sensitive     IMIPENEM 2 SENSITIVE Sensitive     CEFEPIME 1 SENSITIVE Sensitive     * ABUNDANT PSEUDOMONAS AERUGINOSA  Culture, Respiratory w Gram Stain     Status: None (Preliminary result)   Collection Time: 05/30/22  2:45 PM   Specimen: Tracheal Aspirate  Result Value Ref Range Status   Specimen Description   Final    TRACHEAL ASPIRATE Performed at [facility], [place] [address], [place], [place] [address]    Special Requests   Final    NONE Performed at [facility], [address] [place], [place] [address]    Gram Stain   Final    ABUNDANT WBC PRESENT, PREDOMINANTLY PMN ABUNDANT GRAM NEGATIVE RODS FEW GRAM POSITIVE COCCI    Culture   Final    RARE PSEUDOMONAS AERUGINOSA CONFIRMATION OF SUSCEPTIBILITIES IN PROGRESS Performed at [facility], [place] [address], [place], [place] [address]    Report Status PENDING  Incomplete   Culture, blood (Routine X 2) w Reflex to ID Panel     Status: None (Preliminary result)   Collection Time: 05/31/22  4:28 AM   Specimen: BLOOD  Result Value Ref Range Status   Specimen Description BLOOD BLOOD LEFT HAND  Final   Special Requests   Final    BOTTLES DRAWN AEROBIC AND ANAEROBIC Blood Culture results may not be optimal due to an excessive volume of blood received in culture bottles   Culture   Final    NO GROWTH 3 DAYS Performed at [facility], [address] [place], [place] [address]    Report Status PENDING  Incomplete  Respiratory (~20 pathogens) panel by PCR     Status: None   Collection Time: 05/31/22  6:25 PM   Specimen: Nasopharyngeal Swab; Respiratory  Result Value Ref Range Status   Adenovirus NOT DETECTED NOT DETECTED Final   Coronavirus 229E NOT DETECTED NOT DETECTED Final    Comment: (NOTE) The Coronavirus on the Respiratory Panel, DOES NOT test for the novel  Coronavirus (2019 nCoV)    Coronavirus HKU1 NOT DETECTED NOT DETECTED Final   Coronavirus NL63 NOT DETECTED NOT DETECTED Final   Coronavirus OC43 NOT DETECTED NOT DETECTED Final   Metapneumovirus NOT DETECTED NOT DETECTED Final   Rhinovirus / Enterovirus NOT DETECTED NOT DETECTED Final   Influenza A NOT DETECTED NOT DETECTED Final   Influenza B NOT DETECTED NOT DETECTED Final   Parainfluenza Virus 1 NOT DETECTED NOT DETECTED Final   Parainfluenza Virus 2 NOT DETECTED NOT DETECTED Final   Parainfluenza Virus 3 NOT DETECTED NOT DETECTED Final   Parainfluenza Virus 4 NOT DETECTED NOT DETECTED Final   Respiratory Syncytial Virus NOT DETECTED NOT DETECTED Final   Bordetella pertussis NOT DETECTED NOT DETECTED Final   Bordetella Parapertussis NOT DETECTED NOT DETECTED Final   Chlamydophila pneumoniae NOT DETECTED NOT DETECTED Final   Mycoplasma pneumoniae NOT DETECTED NOT DETECTED Final    Comment: Performed at [facility], [place] [address], [place],  [address]          Radiology Studies: DG Abd Portable 1V  Result Date: 06/01/2022 CLINICAL DATA:  Constipation, persistent vegetative state EXAM: PORTABLE ABDOMEN - 1 VIEW COMPARISON:  None Available. FINDINGS: Percutaneous gastrostomy. Nonobstructive pattern  of bowel gas. No large burden of stool. No radio-opaque calculi or other significant radiographic abnormality are seen. IMPRESSION: 1. Nonobstructive pattern of bowel gas. No large burden of stool. 2. Percutaneous gastrostomy. Electronically Signed   By: Delanna Ahmadi M.D.   On: 06/01/2022 16:43        Scheduled Meds:  apixaban  5 mg Per Tube BID   atropine  2 drop Sublingual QID   carvedilol  6.25 mg Per Tube BID WC   chlorhexidine gluconate (MEDLINE KIT)  15 mL Mouth Rinse BID   doxazosin  2 mg Per Tube QHS   famotidine  20 mg Per Tube BID   feeding supplement (PROSource TF20)  60 mL Per Tube Daily   fluticasone  2 spray Each Nare Daily   free water  30 mL Per Tube Q4H   glycopyrrolate  1 mg Per Tube TID   guaiFENesin  15 mL Per Tube Q6H   hydrALAZINE  50 mg Per Tube TID   ipratropium-albuterol  3 mL Nebulization TID   isosorbide dinitrate  30 mg Per Tube TID   levETIRAcetam  750 mg Per Tube BID   liver oil-zinc oxide  1 Application Topical BID   metoCLOPramide  10 mg Per Tube TID AC   mouth rinse  15 mL Mouth Rinse Q4H   polyethylene glycol  17 g Per Tube BID   scopolamine  1 patch Transdermal Q72H   senna-docusate  1 tablet Per Tube BID   spironolactone  25 mg Per Tube Daily   valproic acid  750 mg Per Tube TID   Continuous Infusions:  cefTAZidime (FORTAZ)  IV 200 mL/hr at 06/03/22 0544   feeding supplement (OSMOLITE 1.5 CAL) 35 mL/hr at 06/03/22 0544     LOS: 4 days   Time spent= 35 mins    Swayzee Wadley Arsenio Loader, MD [facility]  If 7PM-7AM, please contact night-coverage  06/03/2022, 8:23 AM

## 2022-06-04 DIAGNOSIS — J209 Acute bronchitis, unspecified: Secondary | ICD-10-CM | POA: Diagnosis not present

## 2022-06-04 LAB — BASIC METABOLIC PANEL
Anion gap: 6 (ref 5–15)
BUN: 39 mg/dL — ABNORMAL HIGH (ref 6–20)
CO2: 32 mmol/L (ref 22–32)
Calcium: 9.4 mg/dL (ref 8.9–10.3)
Chloride: 103 mmol/L (ref 98–111)
Creatinine, Ser: 1.15 mg/dL (ref 0.61–1.24)
GFR, Estimated: >60 mL/min (ref >60)
Glucose, Bld: 97 mg/dL (ref 70–99)
Potassium: 4.3 mmol/L (ref 3.5–5.1)
Sodium: 141 mmol/L (ref 135–145)

## 2022-06-04 LAB — CULTURE, BLOOD (ROUTINE X 2): Culture: NO GROWTH

## 2022-06-04 LAB — PHOSPHORUS: Phosphorus: 3.8 mg/dL (ref 2.5–4.6)

## 2022-06-04 LAB — CBC
HCT: 32.1 % — ABNORMAL LOW (ref 39.0–52.0)
Hemoglobin: 9.8 g/dL — ABNORMAL LOW (ref 13.0–17.0)
MCH: 31.8 pg (ref 26.0–34.0)
MCHC: 30.5 g/dL (ref 30.0–36.0)
MCV: 104.2 fL — ABNORMAL HIGH (ref 80.0–100.0)
Platelets: 235 10*3/uL (ref 150–400)
RBC: 3.08 MIL/uL — ABNORMAL LOW (ref 4.22–5.81)
RDW: 14.3 % (ref 11.5–15.5)
WBC: 6.2 10*3/uL (ref 4.0–10.5)
nRBC: 0 % (ref 0.0–0.2)

## 2022-06-04 LAB — MAGNESIUM: Magnesium: 2.3 mg/dL (ref 1.7–2.4)

## 2022-06-04 MED ORDER — OSMOLITE 1.5 CAL PO LIQD
1000.0000 mL | ORAL | Status: DC
  Administered 2022-06-04: 1000 mL

## 2022-06-04 NOTE — Progress Notes (Signed)
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  Chest x-ray was overall unremarkable.  Procalcitonin, BNP negative.  COVID/flu/RSV were negative.  Tube feeds restarted at a slower rate.  Eventually tracheal cultures were growing Pseudomonas therefore antibiotics were adjusted   Assessment & Plan:  Principal Problem:   Acute tracheobronchitis Active Problems:   Acute on chronic respiratory failure with hypoxia   Essential hypertension   Seizure disorder   Anxiety   GERD without esophagitis    Assessment and Plan: * Acute tracheobronchitis, Pseudomonas Anoxic brain injury status post tracheostomy/PEG - BNP, procalcitonin, flu, RSV, COVID-19-negative.   Currently on bronchodilators scheduled and as needed.    Sputum was on respiratory cultures therefore adjust antibiotics.  Continue azithromycin, On Fortaz.  Intermittent vomiting, SCHEDULED REGLAN, SLOWLY INCREASE TF TO 60CC/HR KUB= looks ok.   Acute on chronic respiratory failure with hypoxia ([name]) History of congestive CHF with EF 30% - Monitor volume status.  Supportive care. -Continue Coreg, Cardura, isosorbide, Aldactone  Essential hypertension - We will continue his antihypertensives.  History of DVT - On Eliquis  Seizure disorder ([place]) - Keppra   GERD without esophagitis - We will continue PPI therapy  Anxiety Klonopin  DVT prophylaxis: Eliquis Code Status: Full  Family Communication: Called his wife, [id number].    Status is: Inpatient Remains inpatient appropriate because: Plan for Dc tomorrow if tolerated his TF today   Subjective: Small vomiting overnight, doing btter  today  Examination: Constitutional: Not in acute distress; 5L [place] Respiratory: Mild rhonchi anteriorly.  Cardiovascular: Normal sinus rhythm, no rubs Abdomen: Nontender nondistended good bowel sounds Musculoskeletal: No edema noted Skin: No rashes seen Neurologic: Unable to assess Psychiatric: Unable to assess  Trach in palce Objective: Vitals:   06/03/22 1935 06/03/22 1948 06/04/22 0418 06/04/22 0741  BP: 121/87  (!) 128/92 (!) 130/97  Pulse: 97  91 87  Resp: 20  16 16   Temp: 98.6 F (37 C)  98.3 F (36.8 C) 97.9 F (36.6 C)  TempSrc:   Oral   SpO2: 98% 94% 100% 99%  Weight:   106.6 kg   Height:        Intake/Output Summary (Last 24 hours) at 06/04/2022 1235 Last data filed at 06/03/2022 1523 Gross per 24 hour  Intake 570.25 ml  Output --  Net 570.25 ml   Filed Weights   06/02/22 0500 06/03/22 0452 06/04/22 0418  Weight: 106 kg 106.4 kg 106.6 kg     Data Reviewed:   CBC: Recent Labs  Lab 05/30/22 0258 05/31/22 0428 06/01/22 0635 06/02/22 0355 06/03/22 0411 06/04/22 0613  WBC 11.2* 8.8 8.5 7.7 6.8 6.2  NEUTROABS 7.3  --   --   --   --   --   HGB 10.9* 10.2* 9.8* 10.5* 10.3* 9.8*  HCT 35.9* 33.2* 32.1* 33.5* 33.1* 32.1*  MCV 105.3* 104.1* 105.6* 102.8* 102.8* 104.2*  PLT 208 207 PLATELET CLUMPS NOTED ON SMEAR, UNABLE TO ESTIMATE 229 236 [id number]   Basic Metabolic Panel: Recent Labs  Lab 05/31/22 0428 06/01/22 0635 06/02/22 0355 06/03/22 0411 06/04/22 0613  NA 141 137 140 141 141  K 5.0 4.8 4.5 4.1 4.3  CL 102 101 98 102 103  CO2 30 27 28  31 32  GLUCOSE 137* 102* 73 101* 97  BUN 42* 44* 38* 37* 39*  CREATININE 1.21 1.23 1.16 1.14 1.15  CALCIUM 9.5 9.3 9.9 9.6 9.4  MG 1.9 2.0 2.1 2.2 2.3  PHOS 4.6 4.0 4.8* 4.1 3.8   GFR: Estimated Creatinine Clearance: 95.9 mL/min (by C-G formula based on SCr of 1.15 mg/dL). Liver Function Tests: No results for input(s): "AST", "ALT", "ALKPHOS", "BILITOT", "PROT", "ALBUMIN" in the last 168 hours. No results for  input(s): "LIPASE", "AMYLASE" in the last 168 hours. No results for input(s): "AMMONIA" in the last 168 hours. Coagulation Profile: No results for input(s): "INR", "PROTIME" in the last 168 hours. Cardiac Enzymes: No results for input(s): "CKTOTAL", "CKMB", "CKMBINDEX", "TROPONINI" in the last 168 hours. BNP (last 3 results) No results for input(s): "PROBNP" in the last 8760 hours. HbA1C: No results for input(s): "HGBA1C" in the last 72 hours. CBG: No results for input(s): "GLUCAP" in the last 168 hours. Lipid Profile: No results for input(s): "CHOL", "HDL", "LDLCALC", "TRIG", "CHOLHDL", "LDLDIRECT" in the last 72 hours. Thyroid Function Tests: No results for input(s): "TSH", "T4TOTAL", "FREET4", "T3FREE", "THYROIDAB" in the last 72 hours. Anemia Panel: No results for input(s): "VITAMINB12", "FOLATE", "FERRITIN", "TIBC", "IRON", "RETICCTPCT" in the last 72 hours. Sepsis Labs: Recent Labs  Lab 05/30/22 0258 05/30/22 0428  PROCALCITON <0.10  --   LATICACIDVEN  --  1.3    Recent Results (from the past 240 hour(s))  Resp panel by RT-PCR (RSV, Flu A&B, Covid) Anterior Nasal Swab     Status: None   Collection Time: 05/30/22  2:58 AM   Specimen: Anterior Nasal Swab  Result Value Ref Range Status   SARS Coronavirus 2 by RT PCR NEGATIVE NEGATIVE Final    Comment: (NOTE) SARS-CoV-2 target nucleic acids are NOT DETECTED.  The SARS-CoV-2 RNA is generally detectable in upper respiratory specimens during the acute phase of infection. The lowest concentration of SARS-CoV-2 viral copies this assay can detect is 138 copies/mL. A negative result does not preclude SARS-Cov-2 infection and should not be used as the sole basis for treatment or other patient management decisions. A negative result may occur with  improper specimen collection/handling, submission of specimen other than nasopharyngeal swab, presence of viral mutation(s) within the areas targeted by this assay, and inadequate  number of viral copies(<138 copies/mL). A negative result must be combined with clinical observations, patient history, and epidemiological information. The expected result is Negative.  Fact Sheet for Patients:  EntrepreneurPulse.com.au  Fact Sheet for Healthcare Providers:  IncredibleEmployment.be  This test is no t yet approved or cleared by the [place] FDA and  has been authorized for detection and/or diagnosis of SARS-CoV-2 by FDA under an Emergency Use Authorization (EUA). This EUA will remain  in effect (meaning this test can be used) for the duration of the COVID-19 declaration under Section 564(b)(1) of the Act, 21 U.S.C.section 360bbb-3(b)(1), unless the authorization is terminated  or revoked sooner.       Influenza A by PCR NEGATIVE NEGATIVE Final   Influenza B by PCR NEGATIVE NEGATIVE Final    Comment: (NOTE) The Xpert Xpress SARS-CoV-2/FLU/RSV plus assay is intended as an aid in the diagnosis of influenza from Nasopharyngeal swab specimens and should not be used as a sole basis for treatment. Nasal washings and aspirates are unacceptable for Xpert Xpress SARS-CoV-2/FLU/RSV testing.  Fact Sheet for Patients: EntrepreneurPulse.com.au  Fact Sheet for Healthcare Providers: IncredibleEmployment.be  This test is not yet approved or cleared by the [place]  States FDA and has been authorized for detection and/or diagnosis of SARS-CoV-2 by FDA under an Emergency Use Authorization (EUA). This EUA will remain in effect (meaning this test can be used) for the duration of the COVID-19 declaration under Section 564(b)(1) of the Act, 21 U.S.C. section 360bbb-3(b)(1), unless the authorization is terminated or revoked.     Resp Syncytial Virus by PCR NEGATIVE NEGATIVE Final    Comment: (NOTE) Fact Sheet for Patients: EntrepreneurPulse.com.au  Fact Sheet for Healthcare  Providers: IncredibleEmployment.be  This test is not yet approved or cleared by the [place] FDA and has been authorized for detection and/or diagnosis of SARS-CoV-2 by FDA under an Emergency Use Authorization (EUA). This EUA will remain in effect (meaning this test can be used) for the duration of the COVID-19 declaration under Section 564(b)(1) of the Act, 21 U.S.C. section 360bbb-3(b)(1), unless the authorization is terminated or revoked.  Performed at [facility], [address] [place], [place] [address]   Blood culture (routine x 2)     Status: None   Collection Time: 05/30/22  4:28 AM   Specimen: BLOOD  Result Value Ref Range Status   Specimen Description BLOOD BLOOD RIGHT ARM  Final   Special Requests   Final    BOTTLES DRAWN AEROBIC AND ANAEROBIC Blood Culture results may not be optimal due to an excessive volume of blood received in culture bottles   Culture   Final    NO GROWTH 5 DAYS Performed at [facility], [address], [place], [place] [address]    Report Status 06/04/2022 FINAL  Final  Culture, Respiratory w Gram Stain     Status: None   Collection Time: 05/30/22  4:28 AM   Specimen: Tracheal Aspirate  Result Value Ref Range Status   Specimen Description   Final    TRACHEAL ASPIRATE Performed at [facility], [address], [place], [place] [address]    Special Requests   Final    NONE Performed at [facility], [address] [place], [place] [address]    Gram Stain   Final    RARE WBC PRESENT,BOTH PMN AND MONONUCLEAR RARE GRAM NEGATIVE COCCI RARE GRAM POSITIVE COCCI IN PAIRS RARE GRAM POSITIVE RODS    Culture   Final    FEW PSEUDOMONAS AERUGINOSA SUSCEPTIBILITIES PERFORMED ON PREVIOUS CULTURE WITHIN THE LAST 5 DAYS. Performed at [facility], [place] [address] [place], [place] [address]    Report Status 06/02/2022 FINAL  Final  Culture, Respiratory w Gram Stain     Status:  None   Collection Time: 05/30/22  6:06 AM   Specimen: Tracheal Aspirate  Result Value Ref Range Status   Specimen Description   Final    TRACHEAL ASPIRATE Performed at [facility], [address], [place], [place] [address]    Special Requests   Final    NONE Performed at [facility], [place], [place] [address]    Gram Stain   Final    MODERATE WBC PRESENT,BOTH PMN AND MONONUCLEAR FEW GRAM POSITIVE COCCI IN CHAINS RARE GRAM NEGATIVE RODS FEW GRAM NEGATIVE COCCI RARE GRAM POSITIVE RODS Performed at [facility], [address] [name], [place] [address]    Culture ABUNDANT PSEUDOMONAS AERUGINOSA  Final   Report Status 06/01/2022 FINAL  Final   Organism ID, Bacteria PSEUDOMONAS AERUGINOSA  Final      Susceptibility   Pseudomonas aeruginosa - MIC*    CEFTAZIDIME  2 SENSITIVE Sensitive     CIPROFLOXACIN 1 INTERMEDIATE Intermediate     GENTAMICIN <=1 SENSITIVE Sensitive     IMIPENEM 2 SENSITIVE Sensitive     CEFEPIME 1 SENSITIVE Sensitive     * ABUNDANT PSEUDOMONAS AERUGINOSA  Culture, Respiratory w Gram Stain     Status: None   Collection Time: 05/30/22  2:45 PM   Specimen: Tracheal Aspirate  Result Value Ref Range Status   Specimen Description   Final    TRACHEAL ASPIRATE Performed at [facility], [place] [address], [place], [place] [address]    Special Requests   Final    NONE Performed at [facility], [place], [place] [address]    Gram Stain   Final    ABUNDANT WBC PRESENT, PREDOMINANTLY PMN ABUNDANT GRAM NEGATIVE RODS FEW GRAM POSITIVE COCCI Performed at [facility], [place] [address], [place], [place] [address]    Culture RARE PSEUDOMONAS AERUGINOSA  Final   Report Status 06/03/2022 FINAL  Final   Organism ID, Bacteria PSEUDOMONAS AERUGINOSA  Final      Susceptibility   Pseudomonas aeruginosa - MIC*    CEFTAZIDIME 4 SENSITIVE Sensitive     CIPROFLOXACIN <=0.25 SENSITIVE Sensitive      GENTAMICIN <=1 SENSITIVE Sensitive     IMIPENEM 2 SENSITIVE Sensitive     PIP/TAZO 8 SENSITIVE Sensitive     CEFEPIME 2 SENSITIVE Sensitive     * RARE PSEUDOMONAS AERUGINOSA  Culture, blood (Routine X 2) w Reflex to ID Panel     Status: None (Preliminary result)   Collection Time: 05/31/22  4:28 AM   Specimen: BLOOD  Result Value Ref Range Status   Specimen Description BLOOD BLOOD LEFT HAND  Final   Special Requests   Final    BOTTLES DRAWN AEROBIC AND ANAEROBIC Blood Culture results may not be optimal due to an excessive volume of blood received in culture bottles   Culture   Final    NO GROWTH 4 DAYS Performed at [facility], [address] [place], [place] [address]    Report Status PENDING  Incomplete  Respiratory (~20 pathogens) panel by PCR     Status: None   Collection Time: 05/31/22  6:25 PM   Specimen: Nasopharyngeal Swab; Respiratory  Result Value Ref Range Status   Adenovirus NOT DETECTED NOT DETECTED Final   Coronavirus 229E NOT DETECTED NOT DETECTED Final    Comment: (NOTE) The Coronavirus on the Respiratory Panel, DOES NOT test for the novel  Coronavirus (2019 nCoV)    Coronavirus HKU1 NOT DETECTED NOT DETECTED Final   Coronavirus NL63 NOT DETECTED NOT DETECTED Final   Coronavirus OC43 NOT DETECTED NOT DETECTED Final   Metapneumovirus NOT DETECTED NOT DETECTED Final   Rhinovirus / Enterovirus NOT DETECTED NOT DETECTED Final   Influenza A NOT DETECTED NOT DETECTED Final   Influenza B NOT DETECTED NOT DETECTED Final   Parainfluenza Virus 1 NOT DETECTED NOT DETECTED Final   Parainfluenza Virus 2 NOT DETECTED NOT DETECTED Final   Parainfluenza Virus 3 NOT DETECTED NOT DETECTED Final   Parainfluenza Virus 4 NOT DETECTED NOT DETECTED Final   Respiratory Syncytial Virus NOT DETECTED NOT DETECTED Final   Bordetella pertussis NOT DETECTED NOT DETECTED Final   Bordetella Parapertussis NOT DETECTED NOT DETECTED Final   Chlamydophila pneumoniae NOT DETECTED NOT  DETECTED Final   Mycoplasma pneumoniae NOT DETECTED NOT DETECTED Final    Comment: Performed at [facility], [address] [place], [place] [address]         Radiology Studies: No results found.      Scheduled Meds:  apixaban  5 mg Per Tube BID   atropine  2 drop Sublingual QID   carvedilol  6.25 mg Per Tube BID WC   chlorhexidine gluconate (MEDLINE KIT)  15 mL Mouth Rinse BID   doxazosin  2 mg Per Tube QHS   famotidine  20 mg Per Tube BID   feeding supplement (PROSource TF20)  60 mL Per Tube Daily   fluticasone  2 spray Each Nare Daily   free water  30 mL Per Tube Q4H   glycopyrrolate  1 mg Per Tube TID   guaiFENesin  15 mL Per Tube Q6H   hydrALAZINE  50 mg Per Tube TID   ipratropium-albuterol  3 mL Nebulization TID   isosorbide dinitrate  30 mg Per Tube TID   levETIRAcetam  750 mg Per Tube BID   liver oil-zinc oxide  1 Application Topical BID   metoCLOPramide  10 mg Per Tube TID AC   mouth rinse  15 mL Mouth Rinse Q4H   polyethylene glycol  17 g Per Tube BID   scopolamine  1 patch Transdermal Q72H   senna-docusate  1 tablet Per Tube BID   spironolactone  25 mg Per Tube Daily   valproic acid  750 mg Per Tube TID   Continuous Infusions:  cefTAZidime (FORTAZ)  IV 2 g (06/04/22 0647)   feeding supplement (OSMOLITE 1.5 CAL) 45 mL/hr at 06/04/22 0956     LOS: 5 days   Time spent= 35 mins    Rennie Hack Arsenio Loader, MD [facility]  If 7PM-7AM, please contact night-coverage  06/04/2022, 12:35 PM

## 2022-06-04 NOTE — TOC Progression Note (Signed)
Transition of Care [facility]) - Progression Note    Patient Details  Name: Billy Cherry MRN: [id number] Date of Birth: 1970-08-12  Transition of Care [facility]) CM/SW Contact  Beverly Sessions, RN Phone Number: 06/04/2022, 3:57 PM  Clinical Narrative:    Patient weaned to trach collar 5L Received VM from [name] with [facility] requesting update on when it is anticipated patient will be ready for discharge.  Message sent to MD to confirm anticipated dc date    Expected Discharge Plan: [place] Barriers to Discharge: Continued Medical Work up  Expected Discharge Plan and Services     Post Acute Care Choice: Resumption of Svcs/PTA Provider Living arrangements for the past 2 months: Single Family Home                                       Social Determinants of Health (SDOH) Interventions SDOH Screenings   Tobacco Use: Low Risk  (05/30/2022)    Readmission Risk Interventions    05/31/2022    2:30 PM 01/08/2022    3:18 PM 04/14/2021   11:14 AM  Readmission Risk Prevention Plan  Transportation Screening Complete Complete Complete  PCP or Specialist Appt within 3-5 Days Complete Complete   HRI or Home Care Consult Complete Complete Complete  Social Work Consult for [place] Planning/Counseling Complete Complete Complete  Palliative Care Screening Not Applicable Not Applicable Complete  Medication Review Press photographer) Complete Complete Complete

## 2022-06-04 NOTE — Progress Notes (Signed)
Patient having loose BM's overnight. Patient cleaned up at 630 am, new sacral foam applied to buttocks. PEG split guaze changed, old dressing noted to have purulent looking milky drainage. Patient suctioned and new split gauze applied around trach. Will continue to monitor patient.

## 2022-06-05 DIAGNOSIS — J209 Acute bronchitis, unspecified: Secondary | ICD-10-CM | POA: Diagnosis not present

## 2022-06-05 LAB — BASIC METABOLIC PANEL
Anion gap: 8 (ref 5–15)
BUN: 36 mg/dL — ABNORMAL HIGH (ref 6–20)
CO2: 32 mmol/L (ref 22–32)
Calcium: 9.2 mg/dL (ref 8.9–10.3)
Chloride: 101 mmol/L (ref 98–111)
Creatinine, Ser: 1.1 mg/dL (ref 0.61–1.24)
GFR, Estimated: >60 mL/min (ref >60)
Glucose, Bld: 116 mg/dL — ABNORMAL HIGH (ref 70–99)
Potassium: 4.3 mmol/L (ref 3.5–5.1)
Sodium: 141 mmol/L (ref 135–145)

## 2022-06-05 LAB — CBC
HCT: 33.2 % — ABNORMAL LOW (ref 39.0–52.0)
Hemoglobin: 10.2 g/dL — ABNORMAL LOW (ref 13.0–17.0)
MCH: 32.3 pg (ref 26.0–34.0)
MCHC: 30.7 g/dL (ref 30.0–36.0)
MCV: 105.1 fL — ABNORMAL HIGH (ref 80.0–100.0)
Platelets: 240 10*3/uL (ref 150–400)
RBC: 3.16 MIL/uL — ABNORMAL LOW (ref 4.22–5.81)
RDW: 14.1 % (ref 11.5–15.5)
WBC: 6.4 10*3/uL (ref 4.0–10.5)
nRBC: 0 % (ref 0.0–0.2)

## 2022-06-05 LAB — CULTURE, BLOOD (ROUTINE X 2): Culture: NO GROWTH

## 2022-06-05 LAB — PHOSPHORUS: Phosphorus: 2.9 mg/dL (ref 2.5–4.6)

## 2022-06-05 LAB — MAGNESIUM: Magnesium: 2.2 mg/dL (ref 1.7–2.4)

## 2022-06-05 MED ORDER — HYDROCORTISONE (PERIANAL) 2.5 % EX CREA: 1.0000 | TOPICAL_CREAM | Freq: Four times a day (QID) | CUTANEOUS | 0 refills | Status: DC | PRN

## 2022-06-05 MED ORDER — LEVOFLOXACIN 750 MG PO TABS: 750.0000 mg | ORAL_TABLET | Freq: Every day | ORAL | Status: DC

## 2022-06-05 MED ORDER — LEVOFLOXACIN 750 MG PO TABS: 750.0000 mg | ORAL_TABLET | Freq: Every day | ORAL | 0 refills | Status: AC

## 2022-06-05 MED ORDER — LEVOFLOXACIN IN D5W 750 MG/150ML IV SOLN
750.0000 mg | Freq: Once | INTRAVENOUS | Status: AC
  Administered 2022-06-05: 750 mg via INTRAVENOUS
  Filled 2022-06-05: qty 150

## 2022-06-05 NOTE — Progress Notes (Signed)
Legal guardian was notified about discharge and instructions were reviewed with patient's wife. IV was taken out. Call back number given in case they had more questions.

## 2022-06-05 NOTE — Discharge Summary (Signed)
Physician Discharge Summary   Patient: Billy Cherry MRN: [id number] DOB: [birth year]  Admit date:     05/30/2022  Discharge date: 06/05/22  Discharge Physician: Fritzi Mandes   PCP: [facility] [place] [facility]   Recommendations at discharge:    F/u PCP in 1-2 weeks Resume gen nursing care including PEG feeding like before  Discharge Diagnoses: Principal Problem:   Acute tracheobronchitis Active Problems:   Acute on chronic respiratory failure with hypoxia   Essential hypertension   Seizure disorder   Anxiety   GERD without esophagitis  52 year old with history of anoxic brain injury and persistent vegetative state status post tracheostomy and PEG tube placement, chronic systolic CHF, HTN, 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 Chest x-ray was overall unremarkable. Procalcitonin, BNP negative. COVID/flu/RSV were negative. Tube feeds restarted at a slower rate. Eventually tracheal cultures were growing Pseudomonas therefore antibiotics were adjusted    Acute tracheobronchitis, Pseudomonas Anoxic brain injury status post tracheostomy/PEG - BNP, procalcitonin, flu, RSV, COVID-19-negative.   Currently on bronchodilators scheduled and as needed.    Sputum was on respiratory cultures therefore adjust antibiotics.   -wbc normal. Changed to po levaquin (total 7 days) Intermittent vomiting, SCHEDULED REGLAN, SLOWLY INCREASE TF TO 60CC/HR KUB= looks ok.  --tolerating current feed   Acute on chronic respiratory failure with hypoxia ([name]) History of congestive CHF with EF 30% - Supportive care. -Continue Coreg, Cardura, isosorbide, Aldactone   Essential hypertension - continue his antihypertensives.   History of DVT - On Eliquis   Seizure disorder ([place]) - Keppra , depakote   GERD without esophagitis -continue PPI therapy   Anxiety Klonopin   DVT prophylaxis: Eliquis Code Status: Full  Family Communication: Called his wife and she is ok with  returning home [id number].     Status is: Inpatient Remains inpatient appropriate because: d/c today     Subjective: doing btter today. No issues per RN       Consultants: none Procedures performed: none  Disposition: Home health Diet recommendation:  On PEG feeding   DISCHARGE MEDICATION: Allergies as of 06/05/2022       Reactions   Zestril [lisinopril] Swelling   angioedema        Medication List     STOP taking these medications    ipratropium 0.02 % nebulizer solution Commonly known as: ATROVENT       TAKE these medications    acetaminophen 325 MG tablet Commonly known as: TYLENOL Take 650 mg by mouth every 6 (six) hours as needed.   albuterol (2.5 MG/3ML) 0.083% nebulizer solution Commonly known as: PROVENTIL Take 3 mLs (2.5 mg total) by nebulization every 6 (six) hours as needed for wheezing or shortness of breath.   apixaban 5 MG Tabs tablet Commonly known as: ELIQUIS Place 1 tablet (5 mg total) into feeding tube 2 (two) times daily.   atropine 1 % ophthalmic solution Place 2 drops under the tongue 4 (four) times daily. What changed: additional instructions   carvedilol 6.25 MG tablet Commonly known as: COREG Place 1 tablet (6.25 mg total) into feeding tube 2 (two) times daily with a meal.   chlorhexidine gluconate (MEDLINE KIT) 0.12 % solution Commonly known as: PERIDEX 15 mLs by Mouth Rinse route 2 (two) times daily.   clonazePAM 0.5 MG tablet Commonly known as: KLONOPIN Place 1 tablet (0.5 mg total) into feeding tube 2 (two) times daily as needed for anxiety.   liver oil-zinc oxide 40 % ointment Commonly known as: DESITIN Apply  topically. Apply 1 application. topically as needed for dry skin.   Desitin 40 % Pste Generic drug: Zinc Oxide Apply 1 Application topically in the morning and at bedtime.   doxazosin 2 MG tablet Commonly known as: CARDURA PLACE 1 TABLET (2 MG TOTAL) INTO FEEDING TUBE DAILY. What changed: when to take  this   famotidine 20 MG tablet Commonly known as: PEPCID PLACE 1 TABLET (20 MG TOTAL) INTO FEEDING TUBE 2 (TWO) TIMES DAILY.   feeding supplement (OSMOLITE 1.5 CAL) Liqd Place 1,000 mLs into feeding tube continuous.   fiber Pack packet Place 1 packet into feeding tube 2 (two) times daily.   fluticasone 50 MCG/ACT nasal spray Commonly known as: FLONASE Place 2 sprays into both nostrils daily.   free water Soln Place 200 mLs into feeding tube every 6 (six) hours.   GaviLAX 17 GM/SCOOP powder Generic drug: polyethylene glycol powder Place 1 capful (17 g) into feeding tube daily as needed for mild constipation or moderate constipation.   glycopyrrolate 1 MG tablet Commonly known as: ROBINUL Place 1 tablet (1 mg total) into feeding tube 3 (three) times daily. What changed: additional instructions   hydrALAZINE 50 MG tablet Commonly known as: APRESOLINE Place 1 tablet (50 mg total) into feeding tube 3 (three) times daily. What changed: additional instructions   hydrocortisone 2.5 % rectal cream Commonly known as: ANUSOL-HC Apply 1 Application topically 4 (four) times daily as needed for hemorrhoids.   ipratropium-albuterol 0.5-2.5 (3) MG/3ML Soln Commonly known as: DUONEB TAKE 3 ML BY NEBULIZATION EVERY 4 HOUR AS NEED (WHEEZING, SHORTNESS OF BREATH). USE FIRST BEFORE ALBUTEROL   isosorbide dinitrate 30 MG tablet Commonly known as: ISORDIL Place 1 tablet (30 mg total) into feeding tube 3 (three) times daily.   levETIRAcetam 100 MG/ML solution Commonly known as: KEPPRA PLACE 7.5 MLS (750 MG TOTAL) INTO FEEDING TUBE 2 (TWO) TIMES DAILY.   levofloxacin 750 MG tablet Commonly known as: LEVAQUIN Place 1 tablet (750 mg total) into feeding tube daily for 1 day. Start taking on: June 06, 2022   Loperamide HCl 1 MG/7.5ML Liqd Place 15 mLs (2 mg total) into feeding tube daily as needed for diarrhea or loose stools. What changed: when to take this   metoCLOPramide 5 MG/5ML  solution Commonly known as: REGLAN Take 10 mg by mouth every 6 (six) hours. 0200/0800/1400/2000   mouth rinse Liqd solution 15 mLs by Mouth Rinse route every 4 (four) hours.   Nebulizer System All-In-One Misc 1 Inhalation by Does not apply route every hour as needed.   ondansetron 4 MG tablet Commonly known as: Zofran Place 1 tablet (4 mg total) into feeding tube every 8 (eight) hours as needed for nausea or vomiting. Crush finely and place through G tube, followed by a flush   scopolamine 1 MG/3DAYS Commonly known as: TRANSDERM-SCOP PLACE 1 PATCH ONTO THE SKIN EVERY 3 DAYS.   spironolactone 25 MG tablet Commonly known as: ALDACTONE Place 1 tablet (25 mg total) into feeding tube daily.   valproic acid 250 MG/5ML solution Commonly known as: DEPAKENE Place 15 mLs (750 mg total) into feeding tube 3 (three) times daily.        Follow-up Information     [facility] [place]. Schedule an appointment as soon as possible for a visit in 1 week(s).   Contact information: [place]  [phone]                 [facility] Weights  06/03/22 0452 06/04/22 0418 06/05/22 0500  Weight: 106.4 kg 106.6 kg 105.9 kg     Condition at discharge:   The results of significant diagnostics from this hospitalization (including imaging, microbiology, ancillary and laboratory) are listed below for reference.   Imaging Studies: DG Abd Portable 1V  Result Date: 06/01/2022 CLINICAL DATA:  Constipation, persistent vegetative state EXAM: PORTABLE ABDOMEN - 1 VIEW COMPARISON:  None Available. FINDINGS: Percutaneous gastrostomy. Nonobstructive pattern of bowel gas. No large burden of stool. No radio-opaque calculi or other significant radiographic abnormality are seen. IMPRESSION: 1. Nonobstructive pattern of bowel gas. No large burden of stool. 2. Percutaneous gastrostomy. Electronically Signed   By: Delanna Ahmadi M.D.   On: 06/01/2022 16:43   DG Chest Portable  1 View  Result Date: 05/30/2022 CLINICAL DATA:  Shortness of breath EXAM: PORTABLE CHEST 1 VIEW COMPARISON:  None Available. FINDINGS: The heart size and mediastinal contours are within normal limits. Both lungs are clear. The visualized skeletal structures are unremarkable. Tracheostomy tube tip is at the level of the clavicular heads. IMPRESSION: No active disease. Electronically Signed   By: Ulyses Jarred M.D.   On: 05/30/2022 02:51   CT CHEST WO CONTRAST  Result Date: 05/17/2022 CLINICAL DATA:  Respiratory illness. Increased secretions and cough. EXAM: CT CHEST WITHOUT CONTRAST TECHNIQUE: Multidetector CT imaging of the chest was performed following the standard protocol without IV contrast. RADIATION DOSE REDUCTION: This exam was performed according to the departmental dose-optimization program which includes automated exposure control, adjustment of the mA and/or kV according to patient size and/or use of iterative reconstruction technique. COMPARISON:  08/24/2021 FINDINGS: Cardiovascular: Mild cardiac enlargement. Ascending thoracic aorta measures 4 cm in diameter. Aortic atherosclerosis. Mediastinum/Nodes: Tracheostomy tube is in place with tip above the carina. Unremarkable appearance of the esophagus and thyroid gland. No mediastinal or axillary adenopathy. Lungs/Pleura: No pleural fluid or airspace consolidation. Mild mosaic attenuation within the upper lung zones. No signs of pneumothorax. Bilateral posterior segmental and subsegmental lower lobe atelectasis and volume loss is identified. Upper Abdomen: No acute abnormality. Upper pole right kidney cyst noted. No follow-up imaging recommended. Musculoskeletal: No chest wall mass or suspicious bone lesions identified. IMPRESSION: 1. Bilateral posterior segmental and subsegmental lower lobe atelectasis and volume loss. 2. Mild mosaic attenuation within the upper lung zones which may reflect underlying small airways disease. 3. Ascending thoracic  aorta measures 4 cm in diameter. Recommend annual imaging followup by CTA or MRA. This recommendation follows 2010 ACCF/AHA/AATS/ACR/ASA/SCA/SCAI/SIR/STS/SVM Guidelines for the Diagnosis and Management of Patients with Thoracic Aortic Disease. Circulation. 2010; [id number]. Aortic aneurysm NOS (ICD10-I71.9) 4.  Aortic Atherosclerosis (ICD10-I70.0). Electronically Signed   By: Kerby Moors M.D.   On: 05/17/2022 12:54   DG Chest Port 1 View  Result Date: 05/17/2022 CLINICAL DATA:  Evaluate for sepsis. Increased tracheal secretions and cough. EXAM: PORTABLE CHEST 1 VIEW COMPARISON:  01/18/2022 FINDINGS: Tracheostomy tube tip is 3.7 cm above the carina. Stable cardiac enlargement. Lung volumes are low. No pleural effusion, interstitial edema or airspace disease. IMPRESSION: 1. Low lung volumes. 2. Stable cardiac enlargement. Electronically Signed   By: Kerby Moors M.D.   On: 05/17/2022 11:32    Microbiology: Results for orders placed or performed during the hospital encounter of 05/30/22  Resp panel by RT-PCR (RSV, Flu A&B, Covid) Anterior Nasal Swab     Status: None   Collection Time: 05/30/22  2:58 AM   Specimen: Anterior Nasal Swab  Result Value Ref Range Status   SARS Coronavirus  2 by RT PCR NEGATIVE NEGATIVE Final    Comment: (NOTE) SARS-CoV-2 target nucleic acids are NOT DETECTED.  The SARS-CoV-2 RNA is generally detectable in upper respiratory specimens during the acute phase of infection. The lowest concentration of SARS-CoV-2 viral copies this assay can detect is 138 copies/mL. A negative result does not preclude SARS-Cov-2 infection and should not be used as the sole basis for treatment or other patient management decisions. A negative result may occur with  improper specimen collection/handling, submission of specimen other than nasopharyngeal swab, presence of viral mutation(s) within the areas targeted by this assay, and inadequate number of viral copies(<138 copies/mL). A  negative result must be combined with clinical observations, patient history, and epidemiological information. The expected result is Negative.  Fact Sheet for Patients:  EntrepreneurPulse.com.au  Fact Sheet for Healthcare Providers:  IncredibleEmployment.be  This test is no t yet approved or cleared by the [place] FDA and  has been authorized for detection and/or diagnosis of SARS-CoV-2 by FDA under an Emergency Use Authorization (EUA). This EUA will remain  in effect (meaning this test can be used) for the duration of the COVID-19 declaration under Section 564(b)(1) of the Act, 21 U.S.C.section 360bbb-3(b)(1), unless the authorization is terminated  or revoked sooner.       Influenza A by PCR NEGATIVE NEGATIVE Final   Influenza B by PCR NEGATIVE NEGATIVE Final    Comment: (NOTE) The Xpert Xpress SARS-CoV-2/FLU/RSV plus assay is intended as an aid in the diagnosis of influenza from Nasopharyngeal swab specimens and should not be used as a sole basis for treatment. Nasal washings and aspirates are unacceptable for Xpert Xpress SARS-CoV-2/FLU/RSV testing.  Fact Sheet for Patients: EntrepreneurPulse.com.au  Fact Sheet for Healthcare Providers: IncredibleEmployment.be  This test is not yet approved or cleared by the [place] FDA and has been authorized for detection and/or diagnosis of SARS-CoV-2 by FDA under an Emergency Use Authorization (EUA). This EUA will remain in effect (meaning this test can be used) for the duration of the COVID-19 declaration under Section 564(b)(1) of the Act, 21 U.S.C. section 360bbb-3(b)(1), unless the authorization is terminated or revoked.     Resp Syncytial Virus by PCR NEGATIVE NEGATIVE Final    Comment: (NOTE) Fact Sheet for Patients: EntrepreneurPulse.com.au  Fact Sheet for Healthcare  Providers: IncredibleEmployment.be  This test is not yet approved or cleared by the [place] FDA and has been authorized for detection and/or diagnosis of SARS-CoV-2 by FDA under an Emergency Use Authorization (EUA). This EUA will remain in effect (meaning this test can be used) for the duration of the COVID-19 declaration under Section 564(b)(1) of the Act, 21 U.S.C. section 360bbb-3(b)(1), unless the authorization is terminated or revoked.  Performed at [facility], [address] [place], [place] [address]   Blood culture (routine x 2)     Status: None   Collection Time: 05/30/22  4:28 AM   Specimen: BLOOD  Result Value Ref Range Status   Specimen Description BLOOD BLOOD RIGHT ARM  Final   Special Requests   Final    BOTTLES DRAWN AEROBIC AND ANAEROBIC Blood Culture results may not be optimal due to an excessive volume of blood received in culture bottles   Culture   Final    NO GROWTH 5 DAYS Performed at [facility], [address], [place], [place] [address]    Report Status 06/04/2022 FINAL  Final  Culture, Respiratory w Gram Stain     Status: None   Collection  Time: 05/30/22  4:28 AM   Specimen: Tracheal Aspirate  Result Value Ref Range Status   Specimen Description   Final    TRACHEAL ASPIRATE Performed at [facility], [address], [place], [place] [address]    Special Requests   Final    NONE Performed at [facility], [address] [place], [place] [address]    Gram Stain   Final    RARE WBC PRESENT,BOTH PMN AND MONONUCLEAR RARE GRAM NEGATIVE COCCI RARE GRAM POSITIVE COCCI IN PAIRS RARE GRAM POSITIVE RODS    Culture   Final    FEW PSEUDOMONAS AERUGINOSA SUSCEPTIBILITIES PERFORMED ON PREVIOUS CULTURE WITHIN THE LAST 5 DAYS. Performed at [facility], [place] [name], [place], [place] [address]    Report Status 06/02/2022 FINAL  Final  Culture, Respiratory w Gram Stain     Status:  None   Collection Time: 05/30/22  6:06 AM   Specimen: Tracheal Aspirate  Result Value Ref Range Status   Specimen Description   Final    TRACHEAL ASPIRATE Performed at [facility], [address] [place], [place] [address]    Special Requests   Final    NONE Performed at [facility], [place], [place] [address]    Gram Stain   Final    MODERATE WBC PRESENT,BOTH PMN AND MONONUCLEAR FEW GRAM POSITIVE COCCI IN [place]AINS RARE GRAM NEGATIVE RODS FEW GRAM NEGATIVE COCCI RARE GRAM POSITIVE RODS Performed at [facility], [address], [place], [place] [address]    Culture ABUNDANT PSEUDOMONAS AERUGINOSA  Final   Report Status 06/01/2022 FINAL  Final   Organism ID, Bacteria PSEUDOMONAS AERUGINOSA  Final      Susceptibility   Pseudomonas aeruginosa - MIC*    CEFTAZIDIME 2 SENSITIVE Sensitive     CIPROFLOXACIN 1 INTERMEDIATE Intermediate     GENTAMICIN <=1 SENSITIVE Sensitive     IMIPENEM 2 SENSITIVE Sensitive     CEFEPIME 1 SENSITIVE Sensitive     * ABUNDANT PSEUDOMONAS AERUGINOSA  Culture, Respiratory w Gram Stain     Status: None   Collection Time: 05/30/22  2:45 PM   Specimen: Tracheal Aspirate  Result Value Ref Range Status   Specimen Description   Final    TRACHEAL ASPIRATE Performed at [facility], [place] [address], [place], [place] [address]    Special Requests   Final    NONE Performed at [facility], [place], [place] [address]    Gram Stain   Final    ABUNDANT WBC PRESENT, PREDOMINANTLY PMN ABUNDANT GRAM NEGATIVE RODS FEW GRAM POSITIVE COCCI Performed at [facility], [place] [address], [place],  [address]    Culture RARE PSEUDOMONAS AERUGINOSA  Final   Report Status 06/03/2022 FINAL  Final   Organism ID, Bacteria PSEUDOMONAS AERUGINOSA  Final      Susceptibility   Pseudomonas aeruginosa - MIC*    CEFTAZIDIME 4 SENSITIVE Sensitive     CIPROFLOXACIN <=0.25 SENSITIVE Sensitive      GENTAMICIN <=1 SENSITIVE Sensitive     IMIPENEM 2 SENSITIVE Sensitive     PIP/TAZO 8 SENSITIVE Sensitive     CEFEPIME 2 SENSITIVE Sensitive     * RARE PSEUDOMONAS AERUGINOSA  Culture, blood (Routine X 2) w Reflex to ID Panel     Status: None   Collection Time: 05/31/22  4:28 AM   Specimen: BLOOD  Result Value Ref Range Status  Specimen Description BLOOD BLOOD LEFT HAND  Final   Special Requests   Final    BOTTLES DRAWN AEROBIC AND ANAEROBIC Blood Culture results may not be optimal due to an excessive volume of blood received in culture bottles   Culture   Final    NO GROWTH 5 DAYS Performed at [facility], [address] [place], [place] [address]    Report Status 06/05/2022 FINAL  Final  Respiratory (~20 pathogens) panel by PCR     Status: None   Collection Time: 05/31/22  6:25 PM   Specimen: Nasopharyngeal Swab; Respiratory  Result Value Ref Range Status   Adenovirus NOT DETECTED NOT DETECTED Final   Coronavirus 229E NOT DETECTED NOT DETECTED Final    Comment: (NOTE) The Coronavirus on the Respiratory Panel, DOES NOT test for the novel  Coronavirus (2019 nCoV)    Coronavirus HKU1 NOT DETECTED NOT DETECTED Final   Coronavirus NL63 NOT DETECTED NOT DETECTED Final   Coronavirus OC43 NOT DETECTED NOT DETECTED Final   Metapneumovirus NOT DETECTED NOT DETECTED Final   Rhinovirus / Enterovirus NOT DETECTED NOT DETECTED Final   Influenza A NOT DETECTED NOT DETECTED Final   Influenza B NOT DETECTED NOT DETECTED Final   Parainfluenza Virus 1 NOT DETECTED NOT DETECTED Final   Parainfluenza Virus 2 NOT DETECTED NOT DETECTED Final   Parainfluenza Virus 3 NOT DETECTED NOT DETECTED Final   Parainfluenza Virus 4 NOT DETECTED NOT DETECTED Final   Respiratory Syncytial Virus NOT DETECTED NOT DETECTED Final   Bordetella pertussis NOT DETECTED NOT DETECTED Final   Bordetella Parapertussis NOT DETECTED NOT DETECTED Final   Chlamydophila pneumoniae NOT DETECTED NOT DETECTED Final    Mycoplasma pneumoniae NOT DETECTED NOT DETECTED Final    Comment: Performed at [facility], [place], [place], [place] [address]    Labs: CBC: Recent Labs  Lab 05/30/22 0258 05/31/22 0428 06/01/22 [id number] 06/02/22 0355 06/03/22 0411 06/04/22 0613 06/05/22 0503  WBC 11.2*   < > 8.5 7.7 6.8 6.2 6.4  NEUTROABS 7.3  --   --   --   --   --   --   HGB 10.9*   < > 9.8* 10.5* 10.3* 9.8* 10.2*  HCT 35.9*   < > 32.1* 33.5* 33.1* 32.1* 33.2*  MCV 105.3*   < > 105.6* 102.8* 102.8* 104.2* 105.1*  PLT 208   < > PLATELET CLUMPS NOTED ON SMEAR, UNABLE TO ESTIMATE 229 236 235 240   < > = values in this interval not displayed.   Basic Metabolic Panel: Recent Labs  Lab 06/01/22 0635 06/02/22 0355 06/03/22 0411 06/04/22 0613 06/05/22 0503  NA 137 140 141 141 141  K 4.8 4.5 4.1 4.3 4.3  CL 101 98 102 103 101  CO2 27 28 31  32 32  GLUCOSE 102* 73 101* 97 116*  BUN 44* 38* 37* 39* 36*  CREATININE 1.23 1.16 1.14 1.15 1.10  CALCIUM 9.3 9.9 9.6 9.4 9.2  MG 2.0 2.1 2.2 2.3 2.2  PHOS 4.0 4.8* 4.1 3.8 2.9    Discharge time spent: greater than 30 minutes.  Signed: Fritzi Mandes, MD [facility] 06/05/2022

## 2022-06-05 NOTE — Discharge Instructions (Signed)
PEG feeding, nebs and other general nursing care as before

## 2022-06-05 NOTE — TOC Transition Note (Signed)
Transition of Care [facility]) - CM/SW Discharge Note   Patient Details  Name: Billy Cherry MRN: [id number] Date of Birth: 05/29/1970  Transition of Care [facility] [place]) CM/SW Contact:  Billy Sessions, RN Phone Number: 06/05/2022, 1:48 PM   Clinical Narrative:     Patient to discharge today Notified Billy Cherry with Billy Cherry with [facility] Per [place] will come out today at [phone] for start of care visit.  And Care giver Billy Cherry will be out for 11pm-7am  Wife Ms Billy Cherry aware Wife states that she is out of incontinence supplies, trach and feeds  MD to sign new orders for trach supplies and tube feeds and will submit to [employer].  Waiting for Kristen with [employer] to send me the forms that need to be signed  Patient was provided with 3 days of the above supplies at discharge Per Kristen with [employer] supplies will be delivered to house in 2 days   EMS transport called, wife confirms she will be at home    Barriers to Discharge: Continued Medical Work up   Patient Goals and CMS Choice      Discharge Placement                         Discharge Plan and Services Additional resources added to the After Visit Summary for       Post Acute Care Choice: Resumption of Svcs/PTA Provider                               Social Determinants of Health (SDOH) Interventions SDOH Screenings   Tobacco Use: Low Risk  (05/30/2022)     Readmission Risk Interventions    05/31/2022    2:30 PM 01/08/2022    3:18 PM 04/14/2021   11:14 AM  Readmission Risk Prevention Plan  Transportation Screening Complete Complete Complete  PCP or Specialist Appt within 3-5 Days Complete Complete   HRI or Home Care Consult Complete Complete Complete  Social Work Consult for [place] Planning/Counseling Complete Complete Complete  Palliative Care Screening Not Applicable Not Applicable Complete  Medication Review Press photographer) Complete Complete Complete

## 2022-06-11 ENCOUNTER — Encounter: Payer: Self-pay | Admitting: Family Medicine

## 2022-06-11 ENCOUNTER — Ambulatory Visit: Payer: BC Managed Care – PPO | Admitting: Family Medicine

## 2022-06-14 ENCOUNTER — Other Ambulatory Visit: Payer: Self-pay | Admitting: Family Medicine

## 2022-06-14 DIAGNOSIS — G931 Anoxic brain damage, not elsewhere classified: Secondary | ICD-10-CM

## 2022-07-07 LAB — ACID FAST CULTURE WITH REFLEXED SENSITIVITIES (MYCOBACTERIA): Acid Fast Culture: NEGATIVE

## 2022-07-12 LAB — ORG ID BY SEQUENCING RFLX AST

## 2022-07-30 LAB — RAPID GROWER BROTH SUSCEP.
Amikacin: 4
Ciprofloxacin: 4
Clarithromycin: 16
Clofazimine: 0.25
Doxycycline: >8
Imipenem: 32
Linezolid: 2
Moxifloxacin: 4
Tigecycline: 0.5

## 2022-07-30 LAB — ACID FAST CULTURE WITH REFLEXED SENSITIVITIES (MYCOBACTERIA): Acid Fast Culture: POSITIVE — AB

## 2022-07-30 LAB — ORG ID BY SEQUENCING RFLX AST

## 2022-08-09 ENCOUNTER — Emergency Department: Payer: Medicaid Other

## 2022-08-09 ENCOUNTER — Emergency Department
Admission: EM | Admit: 2022-08-09 | Discharge: 2022-08-09 | Disposition: A | Discharge status: Home / Self Care | Payer: Medicaid Other | Attending: Student in an Organized Health Care Education/Training Program | Admitting: Student in an Organized Health Care Education/Training Program

## 2022-08-09 DIAGNOSIS — Z7901 Long term (current) use of anticoagulants: Secondary | ICD-10-CM | POA: Diagnosis not present

## 2022-08-09 DIAGNOSIS — G931 Anoxic brain damage, not elsewhere classified: Secondary | ICD-10-CM | POA: Insufficient documentation

## 2022-08-09 MED ORDER — HYDRALAZINE HCL 50 MG PO TABS: 50.0000 mg | ORAL_TABLET | Freq: Three times a day (TID) | ORAL | Status: DC

## 2022-08-09 MED ORDER — LEVETIRACETAM 100 MG/ML PO SOLN
750.0000 mg | Freq: Two times a day (BID) | ORAL | Status: DC
  Administered 2022-08-09: 750 mg
  Filled 2022-08-09 (×2): qty 7.5

## 2022-08-09 MED ORDER — DOXAZOSIN MESYLATE 2 MG PO TABS
2.0000 mg | ORAL_TABLET | Freq: Every day | ORAL | Status: DC
  Filled 2022-08-09: qty 1

## 2022-08-09 MED ORDER — CARVEDILOL 6.25 MG PO TABS: 6.2500 mg | ORAL_TABLET | Freq: Two times a day (BID) | ORAL | Status: DC

## 2022-08-09 MED ORDER — ATROPINE SULFATE 1 % OP SOLN
2.0000 [drp] | Freq: Four times a day (QID) | OPHTHALMIC | Status: DC
  Administered 2022-08-09: 2 [drp] via SUBLINGUAL
  Filled 2022-08-09: qty 2

## 2022-08-09 MED ORDER — METOCLOPRAMIDE HCL 10 MG/10ML PO SOLN
10.0000 mg | Freq: Four times a day (QID) | ORAL | Status: DC
  Administered 2022-08-09: 10 mg via ORAL
  Filled 2022-08-09 (×4): qty 10

## 2022-08-09 MED ORDER — GLYCOPYRROLATE 1 MG PO TABS
1.0000 mg | ORAL_TABLET | Freq: Three times a day (TID) | ORAL | Status: DC
  Administered 2022-08-09: 1 mg
  Filled 2022-08-09 (×3): qty 1

## 2022-08-09 MED ORDER — ISOSORBIDE DINITRATE 30 MG PO TABS
30.0000 mg | ORAL_TABLET | Freq: Three times a day (TID) | ORAL | Status: DC
  Administered 2022-08-09: 30 mg
  Filled 2022-08-09 (×3): qty 1

## 2022-08-09 MED ORDER — CLONAZEPAM 0.5 MG PO TABS: 0.5000 mg | ORAL_TABLET | Freq: Two times a day (BID) | ORAL | Status: DC | PRN

## 2022-08-09 MED ORDER — ALBUTEROL SULFATE (2.5 MG/3ML) 0.083% IN NEBU: 2.5000 mg | INHALATION_SOLUTION | Freq: Four times a day (QID) | RESPIRATORY_TRACT | Status: DC | PRN

## 2022-08-09 MED ORDER — ACETAMINOPHEN 325 MG PO TABS: 650.0000 mg | ORAL_TABLET | Freq: Four times a day (QID) | ORAL | Status: DC | PRN

## 2022-08-09 MED ORDER — VALPROIC ACID 250 MG/5ML PO SOLN
750.0000 mg | Freq: Three times a day (TID) | ORAL | Status: DC
  Administered 2022-08-09: 750 mg
  Filled 2022-08-09 (×3): qty 15

## 2022-08-09 MED ORDER — FAMOTIDINE 20 MG PO TABS
20.0000 mg | ORAL_TABLET | Freq: Two times a day (BID) | ORAL | Status: DC
  Administered 2022-08-09: 20 mg
  Filled 2022-08-09: qty 1

## 2022-08-09 MED ORDER — APIXABAN 5 MG PO TABS
5.0000 mg | ORAL_TABLET | Freq: Two times a day (BID) | ORAL | Status: DC
  Administered 2022-08-09: 5 mg via ORAL
  Filled 2022-08-09: qty 1

## 2022-08-09 MED ORDER — SPIRONOLACTONE 25 MG PO TABS
25.0000 mg | ORAL_TABLET | Freq: Every day | ORAL | Status: DC
  Administered 2022-08-09: 25 mg
  Filled 2022-08-09: qty 1

## 2022-08-09 NOTE — Progress Notes (Signed)
CSW spoke with [employer] rep Barbara Cower who reports patient has home fill units and should not have ran out of O2. Barbara Cower called patient's spouse Ret[place] who reports their rep called her and provided education to where she feels comfortable with patient returning now as she's able to fill o2.   No further discharge needs.   Darolyn Rua, [place], MSW, [place] [phone]

## 2022-08-09 NOTE — ED Notes (Signed)
This RN spoke to Ms. Colette Ribas, Mr. Guilbault's spouse and updated her on pt's condition and wait for transport.

## 2022-08-09 NOTE — ED Notes (Signed)
Pt. To ED for equipment failure. Pt's oxygen concentrator at home broke, and they were unable to get a new one delivered. Pt's spouse denies any current acute medical condition or change. Pt. Is quadraplegic, with trach. On trach O2, 5L at baseline and currently.

## 2022-08-09 NOTE — ED Triage Notes (Signed)
Pt from home via ems- caretaker reports pts O2 concentrator broke and they were unable to deliver a new one. Pt is a quadriplegic and has a trach.

## 2022-08-09 NOTE — ED Notes (Signed)
ED Provider at bedside. 

## 2022-08-09 NOTE — ED Provider Notes (Signed)
[facility] Provider Note    Event Date/Time   First MD Initiated Contact with Patient 08/09/22 1229     (approximate)   History   Needs Oxygen   HPI  Jayquan Gotshall is a 52 y.o. male is quadriplegic with severe anoxic brain injury with chronic vegetative state trach dependent presents via EMS after caretaker reports that patient's O2 concentrator broke and they were not able to deliver a new 1 today.  No other complaints reported.     Physical Exam   Triage Vital Signs: ED Triage Vitals  Enc Vitals Group     BP      Pulse      Resp      Temp      Temp src      SpO2      Weight      Height      Head Circumference      Peak Flow      Pain Score      Pain Loc      Pain Edu?      Excl. in GC?     Most recent vital signs: Vitals:   08/09/22 1400 08/09/22 1430  BP: 118/74 118/77  Pulse: 98 (!) 104  Resp: 11 20  Temp:    SpO2: 97% 94%     Constitutional: chronically ill appearing Mouth/Throat: Mucous membranes are moist.   Neck: Painless ROM. Trach collar c/d/I no stridor Cardiovascular:   Good peripheral circulation. Respiratory: Normal respiratory effort.  No retractions.  Gastrointestinal: Soft and nontender.     ED Results / Procedures / Treatments   Labs (all labs ordered are listed, but only abnormal results are displayed) Labs Reviewed - No data to display   EKG  ED ECG REPORT I, Willy Eddy, the attending physician, personally viewed and interpreted this ECG.   Date: 08/09/2022  EKG Time: 12:29  Rate: 100  Rhythm: sinus  Axis: normal  Intervals: normal  ST&T Change: no stemi, no depressions    RADIOLOGY Please see ED Course for my review and interpretation.  I personally reviewed all radiographic images ordered to evaluate for the above acute complaints and reviewed radiology reports and findings.  These findings were personally discussed with the patient.  Please see medical record for radiology  report.    PROCEDURES:  Critical Care performed: No  Procedures   MEDICATIONS ORDERED IN ED: Medications  acetaminophen (TYLENOL) tablet 650 mg (has no administration in time range)  albuterol (PROVENTIL) (2.5 MG/3ML) 0.083% nebulizer solution 2.5 mg (has no administration in time range)  atropine 1 % ophthalmic solution 2 drop (2 drops Sublingual Given 08/09/22 1343)  carvedilol (COREG) tablet 6.25 mg (has no administration in time range)  clonazePAM (KLONOPIN) tablet 0.5 mg (has no administration in time range)  doxazosin (CARDURA) tablet 2 mg (has no administration in time range)  apixaban (ELIQUIS) tablet 5 mg (5 mg Oral Given 08/09/22 1332)  famotidine (PEPCID) tablet 20 mg (20 mg Per Tube Given 08/09/22 1332)  glycopyrrolate (ROBINUL) tablet 1 mg (has no administration in time range)  hydrALAZINE (APRESOLINE) tablet 50 mg (has no administration in time range)  isosorbide dinitrate (ISORDIL) tablet 30 mg (has no administration in time range)  levETIRAcetam (KEPPRA) 100 MG/ML solution 750 mg (750 mg Per Tube Given 08/09/22 1334)  metoCLOPramide (REGLAN) 10 MG/10ML solution 10 mg (10 mg Oral Given 08/09/22 1335)  spironolactone (ALDACTONE) tablet 25 mg (25 mg Per Tube Given 08/09/22 1332)  valproic acid (DEPAKENE) 250 MG/5ML solution 750 mg (has no administration in time range)     IMPRESSION / MDM / ASSESSMENT AND PLAN / ED COURSE  I reviewed the triage vital signs and the nursing notes.                              Differential diagnosis includes, but is not limited to, DME malfunction, pneumonia, CHF, tracheitis, aspiration  Presented to ER due to nonfunctioning O2 concentrator at home.  Will consult TOC.  I have ordered home medicines.  Clinical Course as of 08/09/22 1457  Thu Aug 09, 2022  1316 Chest x-ray my review and interpretation with out evidence of consolidation. [PR]  1456 Case management has able to clarify that this was more of an education issue with new O2 delivery  device at home.  Patient does have functioning O2 delivery device back at home and spouse is comfortable with him being discharged. [PR]    Clinical Course User Index [PR] Willy Eddy, MD     FINAL CLINICAL IMPRESSION(S) / ED DIAGNOSES   Final diagnoses:  Anoxic brain injury [facility])     Rx / DC Orders   ED Discharge Orders     None        Note:  This document was prepared using Dragon voice recognition software and may include unintentional dictation errors.    Willy Eddy, MD 08/09/22 [phone]

## 2022-12-27 ENCOUNTER — Emergency Department: Payer: Medicaid Other

## 2022-12-27 ENCOUNTER — Emergency Department
Admission: EM | Admit: 2022-12-27 | Discharge: 2022-12-27 | Disposition: A | Discharge status: Home / Self Care | Payer: Medicaid Other | Attending: Emergency Medicine | Admitting: Emergency Medicine

## 2022-12-27 ENCOUNTER — Other Ambulatory Visit: Payer: Self-pay

## 2022-12-27 DIAGNOSIS — R0602 Shortness of breath: Secondary | ICD-10-CM | POA: Diagnosis present

## 2022-12-27 DIAGNOSIS — Z1152 Encounter for screening for COVID-19: Secondary | ICD-10-CM | POA: Insufficient documentation

## 2022-12-27 DIAGNOSIS — G40909 Epilepsy, unspecified, not intractable, without status epilepticus: Secondary | ICD-10-CM

## 2022-12-27 HISTORY — DX: Anoxic brain damage, not elsewhere classified: G93.1

## 2022-12-27 LAB — PROTIME-INR
INR: 1.2 (ref 0.8–1.2)
Prothrombin Time: 15.1 s (ref 11.4–15.2)

## 2022-12-27 LAB — CBC WITH DIFFERENTIAL/PLATELET
Abs Immature Granulocytes: 0.13 10*3/uL — ABNORMAL HIGH (ref 0.00–0.07)
Basophils Absolute: 0 10*3/uL (ref 0.0–0.1)
Basophils Relative: 0 %
Eosinophils Absolute: 0.2 10*3/uL (ref 0.0–0.5)
Eosinophils Relative: 2 %
HCT: 34.6 % — ABNORMAL LOW (ref 39.0–52.0)
Hemoglobin: 10.1 g/dL — ABNORMAL LOW (ref 13.0–17.0)
Immature Granulocytes: 2 %
Lymphocytes Relative: 25 %
Lymphs Abs: 2 10*3/uL (ref 0.7–4.0)
MCH: 31.8 pg (ref 26.0–34.0)
MCHC: 29.2 g/dL — ABNORMAL LOW (ref 30.0–36.0)
MCV: 108.8 fL — ABNORMAL HIGH (ref 80.0–100.0)
Monocytes Absolute: 1.3 10*3/uL — ABNORMAL HIGH (ref 0.1–1.0)
Monocytes Relative: 16 %
Neutro Abs: 4.5 10*3/uL (ref 1.7–7.7)
Neutrophils Relative %: 55 %
Platelets: 205 10*3/uL (ref 150–400)
RBC: 3.18 MIL/uL — ABNORMAL LOW (ref 4.22–5.81)
RDW: 15.1 % (ref 11.5–15.5)
WBC: 8.1 10*3/uL (ref 4.0–10.5)
nRBC: 0.5 % — ABNORMAL HIGH (ref 0.0–0.2)

## 2022-12-27 LAB — COMPREHENSIVE METABOLIC PANEL
ALT: 25 U/L (ref 0–44)
AST: 36 U/L (ref 15–41)
Albumin: 2.9 g/dL — ABNORMAL LOW (ref 3.5–5.0)
Alkaline Phosphatase: 61 U/L (ref 38–126)
Anion gap: 10 (ref 5–15)
BUN: 57 mg/dL — ABNORMAL HIGH (ref 6–20)
CO2: 33 mmol/L — ABNORMAL HIGH (ref 22–32)
Calcium: 9.1 mg/dL (ref 8.9–10.3)
Chloride: 101 mmol/L (ref 98–111)
Creatinine, Ser: 1.37 mg/dL — ABNORMAL HIGH (ref 0.61–1.24)
GFR, Estimated: >60 mL/min (ref >60)
Glucose, Bld: 185 mg/dL — ABNORMAL HIGH (ref 70–99)
Potassium: 5 mmol/L (ref 3.5–5.1)
Sodium: 144 mmol/L (ref 135–145)
Total Bilirubin: 0.5 mg/dL (ref 0.3–1.2)
Total Protein: 7.1 g/dL (ref 6.5–8.1)

## 2022-12-27 LAB — BLOOD GAS, VENOUS
Acid-Base Excess: 11.4 mmol/L — ABNORMAL HIGH (ref 0.0–2.0)
Bicarbonate: 38.3 mmol/L — ABNORMAL HIGH (ref 20.0–28.0)
O2 Saturation: 92.3 %
Patient temperature: 37
pCO2, Ven: 59 mm[Hg] (ref 44–60)
pH, Ven: 7.42 (ref 7.25–7.43)
pO2, Ven: 63 mm[Hg] — ABNORMAL HIGH (ref 32–45)

## 2022-12-27 LAB — TROPONIN I (HIGH SENSITIVITY)
Troponin I (High Sensitivity): 15 ng/L (ref <18)
Troponin I (High Sensitivity): 13 ng/L (ref <18)

## 2022-12-27 LAB — RESP PANEL BY RT-PCR (RSV, FLU A&B, COVID)  RVPGX2
Influenza A by PCR: NEGATIVE
Influenza B by PCR: NEGATIVE
Resp Syncytial Virus by PCR: NEGATIVE
SARS Coronavirus 2 by RT PCR: NEGATIVE

## 2022-12-27 LAB — BRAIN NATRIURETIC PEPTIDE: B Natriuretic Peptide: 155.1 pg/mL — ABNORMAL HIGH (ref 0.0–100.0)

## 2022-12-27 LAB — LACTIC ACID, PLASMA
Lactic Acid, Venous: 2.1 mmol/L (ref 0.5–1.9)
Lactic Acid, Venous: 1.9 mmol/L (ref 0.5–1.9)

## 2022-12-27 LAB — APTT: aPTT: 28 s (ref 24–36)

## 2022-12-27 MED ORDER — SODIUM CHLORIDE 0.9 % IV SOLN
500.0000 mg | INTRAVENOUS | Status: DC
  Administered 2022-12-27: 500 mg via INTRAVENOUS
  Filled 2022-12-27: qty 5

## 2022-12-27 MED ORDER — LEVETIRACETAM 750 MG PO TABS
750.0000 mg | ORAL_TABLET | Freq: Once | ORAL | Status: AC
  Administered 2022-12-27: 750 mg
  Filled 2022-12-27: qty 1

## 2022-12-27 MED ORDER — LACTATED RINGERS IV SOLN: INTRAVENOUS | Status: DC

## 2022-12-27 MED ORDER — PIPERACILLIN-TAZOBACTAM 3.375 G IVPB 30 MIN
3.3750 g | Freq: Once | INTRAVENOUS | Status: AC
  Administered 2022-12-27: 3.375 g via INTRAVENOUS
  Filled 2022-12-27: qty 50

## 2022-12-27 MED ORDER — LEVETIRACETAM 100 MG/ML PO SOLN: 750.0000 mg | Freq: Two times a day (BID) | ORAL | 1 refills | Status: DC

## 2022-12-27 MED ORDER — SODIUM CHLORIDE 0.9 % IV SOLN
2.0000 g | INTRAVENOUS | Status: DC
  Administered 2022-12-27: 2 g via INTRAVENOUS
  Filled 2022-12-27: qty 20

## 2022-12-27 NOTE — ED Notes (Signed)
Pt has constant tube feeds. EDP gave clearance for pt's aide to start and monitor tube feeding. Pt's aid brought all the feeding supplies with her for pt from pt's home.

## 2022-12-27 NOTE — ED Provider Notes (Signed)
[facility] Provider Note   Event Date/Time   First MD Initiated Contact with Patient 12/27/22 1117     (approximate) History  Shortness of Breath  HPI Billy Cherry is a 52 y.o. male with a past medical history of tracheostomy and PEG as well as in persistent vegetative state who presents via EMS from home for increasing shortness of breath, bloody sputum from there is tracheostomy and vomiting over the last 48 hours.  Patient's home health care nurse at bedside provides this history.  Denies patient having fevers ROS: Unable to assess   Physical Exam  Triage Vital Signs: ED Triage Vitals  Encounter Vitals Group     BP 12/27/22 1123 (!) 94/51     Systolic BP Percentile --      Diastolic BP Percentile --      Pulse Rate 12/27/22 1123 89     Resp 12/27/22 1123 20     Temp 12/27/22 1123 98.1 F (36.7 C)     Temp Source 12/27/22 1123 Axillary     SpO2 12/27/22 1123 92 %     Weight --      Height --      Head Circumference --      Peak Flow --      Pain Score 12/27/22 1116 0     Pain Loc --      Pain Education --      Exclude from Growth Chart --    Most recent vital signs: Vitals:   12/27/22 1430 12/27/22 1530  BP: 109/77 103/83  Pulse: 87 85  Resp: 16 15  Temp:    SpO2: 97% 96%   General: Awake, in bed with tracheostomy in place trach collar CV:  Good peripheral perfusion.  Resp:  Normal effort.  Rales over left lung field Abd:  No distention.  Other:  Middle-aged obese African-American male resting comfortably in no acute distress ED Results / Procedures / Treatments  Labs (all labs ordered are listed, but only abnormal results are displayed) Labs Reviewed  BRAIN NATRIURETIC PEPTIDE - Abnormal; Notable for the following components:      Result Value   B Natriuretic Peptide 155.1 (*)    All other components within normal limits  COMPREHENSIVE METABOLIC PANEL - Abnormal; Notable for the following components:   CO2 33 (*)    Glucose,  Bld 185 (*)    BUN 57 (*)    Creatinine, Ser 1.37 (*)    Albumin 2.9 (*)    All other components within normal limits  CBC WITH DIFFERENTIAL/PLATELET - Abnormal; Notable for the following components:   RBC 3.18 (*)    Hemoglobin 10.1 (*)    HCT 34.6 (*)    MCV 108.8 (*)    MCHC 29.2 (*)    nRBC 0.5 (*)    Monocytes Absolute 1.3 (*)    Abs Immature Granulocytes 0.13 (*)    All other components within normal limits  BLOOD GAS, VENOUS - Abnormal; Notable for the following components:   pO2, Ven 63 (*)    Bicarbonate 38.3 (*)    Acid-Base Excess 11.4 (*)    All other components within normal limits  LACTIC ACID, PLASMA - Abnormal; Notable for the following components:   Lactic Acid, Venous 2.1 (*)    All other components within normal limits  RESP PANEL BY RT-PCR (RSV, FLU A&B, COVID)  RVPGX2  CULTURE, BLOOD (ROUTINE X 2)  CULTURE, BLOOD (ROUTINE X 2)  LACTIC ACID,  PLASMA  PROTIME-INR  APTT  TROPONIN I (HIGH SENSITIVITY)  TROPONIN I (HIGH SENSITIVITY)   EKG ED ECG REPORT I, Merwyn Katos, the attending physician, personally viewed and interpreted this ECG. Date: 12/27/2022 EKG Time: 1116 Rate: 91 Rhythm: normal sinus rhythm QRS Axis: normal Intervals: normal ST/T Wave abnormalities: normal Narrative Interpretation: no evidence of acute ischemia RADIOLOGY ED MD interpretation: Single view portable chest x-ray shows very low lung volumes a tracheostomy tube in place without any significant interval change from previous chest x-ray -Agree with radiology assessment Official radiology report(s): DG Chest Port 1 View  Result Date: 12/27/2022 CLINICAL DATA:  Shortness of breath EXAM: PORTABLE CHEST 1 VIEW COMPARISON:  08/09/2022 FINDINGS: Poor inflation. Bronchovascular crowding with a what may be an enlarged cardiopericardial silhouette and widened mediastinum but unchanged from previous. Tracheostomy tube in place. No pneumothorax, effusion or edema. Overlapping cardiac  leads. IMPRESSION: Very low lung volumes. Tracheostomy tube. No significant interval change. Follow up x-ray with improved inflation could be considered as clinically appropriate Electronically Signed   By: Karen Kays M.D.   On: 12/27/2022 13:22   PROCEDURES: Critical Care performed: No .1-3 Lead EKG Interpretation  Performed by: Merwyn Katos, MD Authorized by: Merwyn Katos, MD     Interpretation: normal     ECG rate:  71   ECG rate assessment: normal     Rhythm: sinus rhythm     Ectopy: none     Conduction: normal    MEDICATIONS ORDERED IN ED: Medications  lactated ringers infusion ( Intravenous New Bag/Given 12/27/22 1221)  azithromycin (ZITHROMAX) 500 mg in sodium chloride 0.9 % 250 mL IVPB (0 mg Intravenous Stopped 12/27/22 1356)  piperacillin-tazobactam (ZOSYN) IVPB 3.375 g (0 g Intravenous Stopped 12/27/22 1356)   IMPRESSION / MDM / ASSESSMENT AND PLAN / ED COURSE  I reviewed the triage vital signs and the nursing notes.                             The patient is on the cardiac monitor to evaluate for evidence of arrhythmia and/or significant heart rate changes. Patient's presentation is most consistent with acute presentation with potential threat to life or bodily function. The patient is suffering from shortness of breath, but the immediate cause is not apparent.  Potential causes considered include, but are not limited to, asthma or COPD, congestive heart failure, pulmonary embolism, pneumothorax, coronary syndrome, pneumonia, and pleural effusion.  Despite the evaluation including history, exam, and testing, the cause of the shortness of breath remains unclear. However, during the ED stay, patient's condition improved, and at the time of discharge the shortness of breath is resolved, they are feeling well, and want to go home.  Patient will be discharged with strict return precautions and advice to follow up with primary MD within 24 hours for further evaluation.    FINAL CLINICAL IMPRESSION(S) / ED DIAGNOSES   Final diagnoses:  SOB (shortness of breath)   Rx / DC Orders   ED Discharge Orders          Ordered    levETIRAcetam (KEPPRA) 100 MG/ML solution  2 times daily        12/27/22 1439           Note:  This document was prepared using Dragon voice recognition software and may include unintentional dictation errors.   Merwyn Katos, MD 12/27/22 [phone]

## 2022-12-27 NOTE — ED Notes (Signed)
Called [facility] to check where patient is on pick up list he is 5th on the [place] truck

## 2022-12-27 NOTE — ED Notes (Addendum)
Patient's wife called and informed that the patient was being sent home via [facility].  All equipment was sent home with the patient, and confirmed with the spouse that the proper equipment was being sent back.  Patient given a dose of his home medication keppra because he is out at home, and his prescription cannot be filled tonight.   Patient's trach was suctioned once more prior to transport.

## 2022-12-27 NOTE — Progress Notes (Signed)
Billy Cherry was changes to 6.0 shiley cuffless flex  without incident in presence of Dr. Donna Bernard.

## 2022-12-27 NOTE — Progress Notes (Signed)
CODE SEPSIS - PHARMACY COMMUNICATION  **Broad Spectrum Antibiotics should be administered within 1 hour of Sepsis diagnosis**  Time Code Sepsis Called/Page Received: 1143  Antibiotics Ordered: Zosyn & azithromycin  Time of 1st antibiotic administration: 1222  Additional action taken by pharmacy: N/A  Billy Cherry 12/27/2022  12:01 PM

## 2022-12-27 NOTE — Progress Notes (Signed)
[facility] is following sepsis

## 2022-12-27 NOTE — ED Notes (Signed)
Secretary made aware that pt needs ems transport back home.

## 2022-12-27 NOTE — ED Notes (Addendum)
Wife called to confirm that pt's tracheostomy had been changed and that pt had been given a prescription for Keppra. I confirmed both had been done. Wife would also like to be called when EMS comes and picks pt up so she will know to watch for them. Her contact info is in the pt's chart. It is also important the pt's belongings (suction and meds) go with him.

## 2022-12-27 NOTE — ED Notes (Signed)
Called RT to suction patient ?

## 2022-12-27 NOTE — ED Notes (Signed)
RT at bedside suctioning pt.

## 2022-12-27 NOTE — ED Triage Notes (Signed)
EMS was called out to patient's residence for increased SOB for the past 2 days; home health administered 2 neb treatments with no improvement.   Duoneb x 1 Per EMS patient remained 96% on his baseline 6L via trach collar.

## 2022-12-28 ENCOUNTER — Telehealth: Payer: Self-pay

## 2022-12-28 ENCOUNTER — Telehealth: Payer: Self-pay | Admitting: Emergency Medicine

## 2022-12-28 LAB — BLOOD CULTURE ID PANEL (REFLEXED) - BCID2

## 2022-12-28 NOTE — Telephone Encounter (Signed)
Notified of positive blood culture. Have asked ED charge nurse Durene Cal to call patient now to return to ED for evaluation.

## 2022-12-31 LAB — CULTURE, BLOOD (ROUTINE X 2)

## 2023-01-01 LAB — CULTURE, BLOOD (ROUTINE X 2): Culture: NO GROWTH

## 2023-01-01 NOTE — Telephone Encounter (Signed)
Called to follow up on positive blood culture from several days ago.  Spoke with spouse.  She says no fever or abnormal vs--home health sees patient.  Says no vomiting and he appears as usual.  Oxygen has been okay.  I explained blood culture result.  He has not seen pcp due to them being out of town--until ?today.  I asked her to call pcp to arrange follow up after ED visit.  I asked her to return if he gets fever, vomiting, abnormal vs or just does not look well or the same.  She agrees.

## 2023-01-24 IMAGING — MR MR HEAD W/O CM
13 series · 47 of 48 positions shown · non-contrast
Comparison: Head CT March 23, 2021

CLINICAL DATA: Anoxic brain damage.

EXAM:
MRI HEAD WITHOUT CONTRAST
TECHNIQUE: Multiplanar, multiecho pulse sequences of the brain and surrounding
structures were obtained without intravenous contrast.

[Series 5: ax dwi_tracew · axial · 3.0mm · 0.65mm/px · z∈[-35,+118]mm · 2 of 48 slices shown]
[im 1/48]
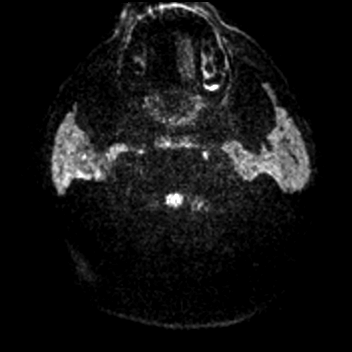
[im 48/48]
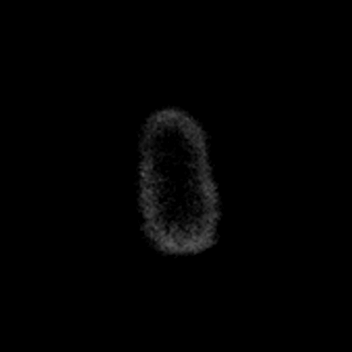

[Series 6: ax dwi_adc · axial · 3.0mm · 0.65mm/px · z∈[-35,+118]mm · 2 of 48 slices shown]
[im 1/48]
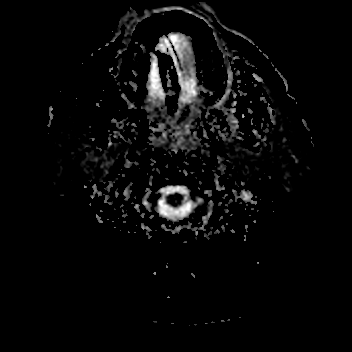
[im 48/48]
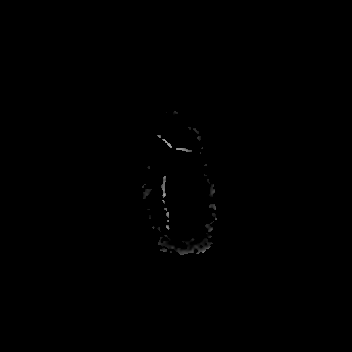

[Series 7: cor dwi_tracew · coronal · 5.0mm · 0.65mm/px · 3 of 40 slices shown]
[im 1/40]
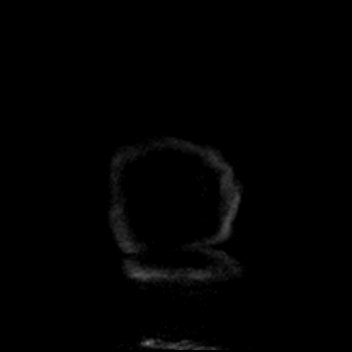
[im 20/40]
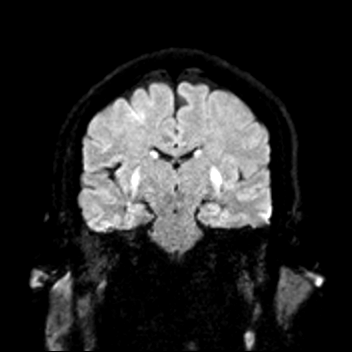
[im 40/40]
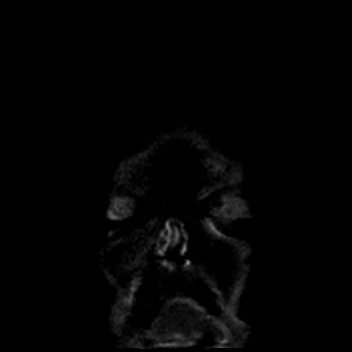

[Series 8: cor dwi_adc · coronal · 5.0mm · 0.65mm/px · 3 of 40 slices shown]
[im 1/40]
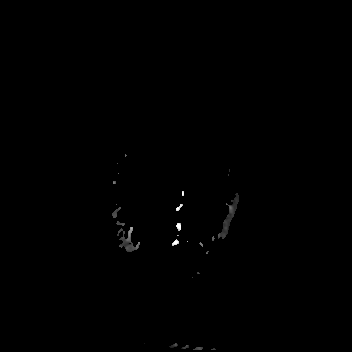
[im 20/40]
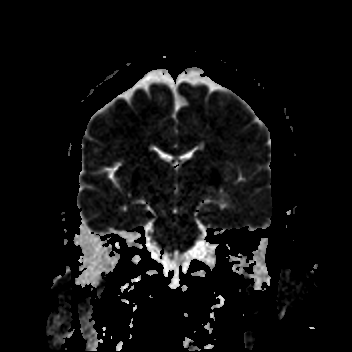
[im 40/40]
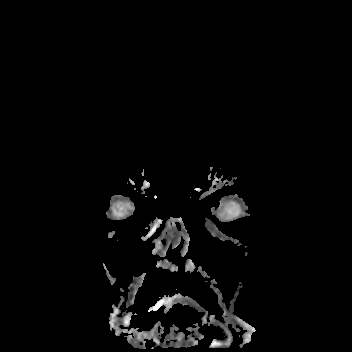

[Series 9: T1 · sagittal · 5.0mm · 0.94mm/px · 2 of 23 slices shown (1 of 2)]
[im 1/23]
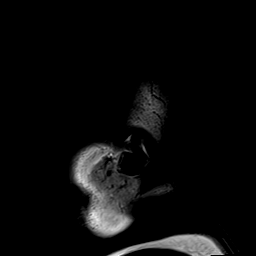
[im 23/23]
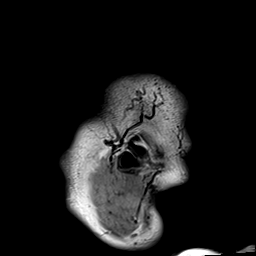

[Series 10: T2 · axial · 5.0mm · 0.45mm/px · z∈[-36,+117]mm · 2 of 27 slices shown (1 of 2)]
[im 1/27]
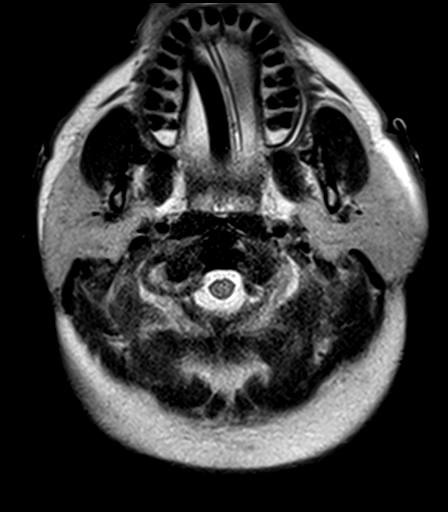
[im 27/27]
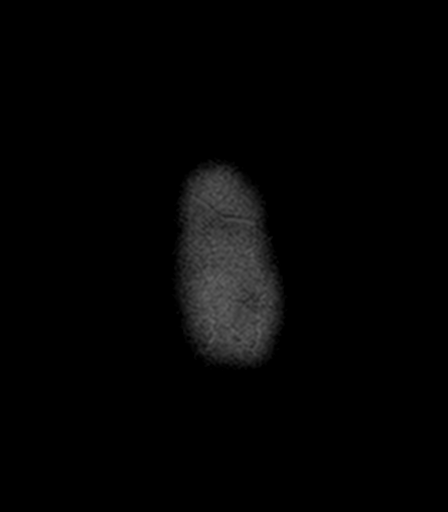

[Series 11: mag_images · axial · 3.0mm · 0.90mm/px · z∈[-46,+127]mm · 4 of 60 slices shown]
[im 1/60]
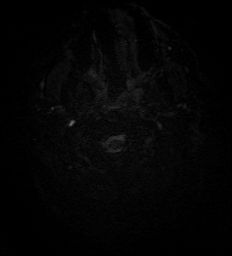
[im 20/60]
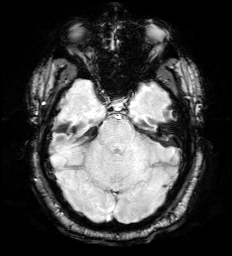
[im 40/60]
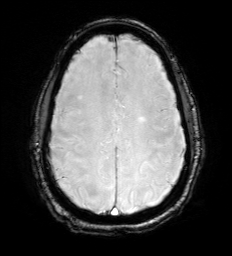
[im 60/60]
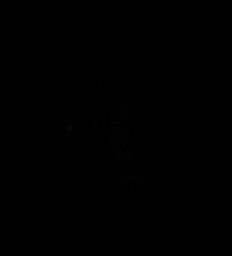

[Series 12: pha_images · axial · 3.0mm · 0.90mm/px · z∈[-46,+121]mm · 4 of 58 slices shown]
[im 1/58]
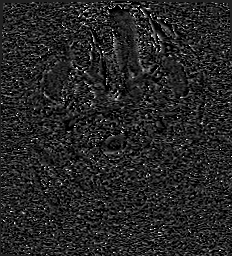
[im 20/58]
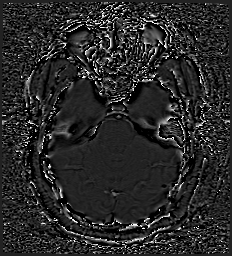
[im 39/58]
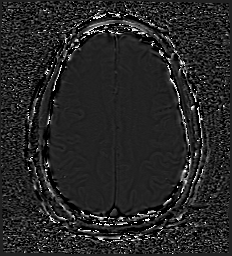
[im 58/58]
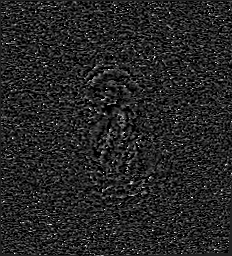

[Series 13: swi_images · axial · 3.0mm · 0.90mm/px · z∈[-46,+127]mm · 4 of 60 slices shown]
[im 1/60]
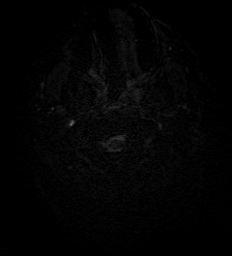
[im 20/60]
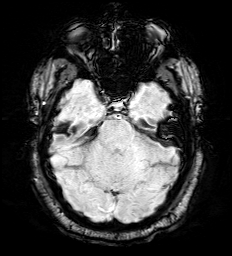
[im 40/60]
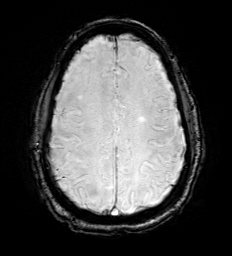
[im 60/60]
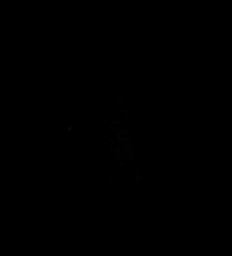

[Series 14: mip_images(sw) · axial · 24.0mm · 0.90mm/px · z∈[-36,+117]mm · 4 of 53 slices shown]
[im 1/53]
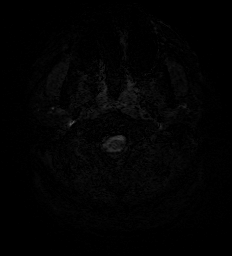
[im 18/53]
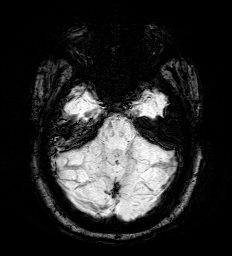
[im 35/53]
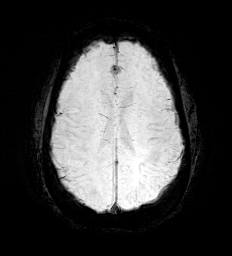
[im 53/53]
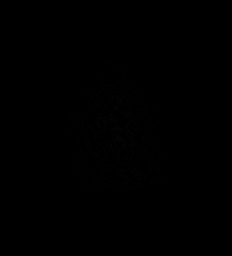

[Series 15: FLAIR · axial · 3.0mm · 0.53mm/px · z∈[-39,+120]mm · 4 of 55 slices shown]
[im 1/55]
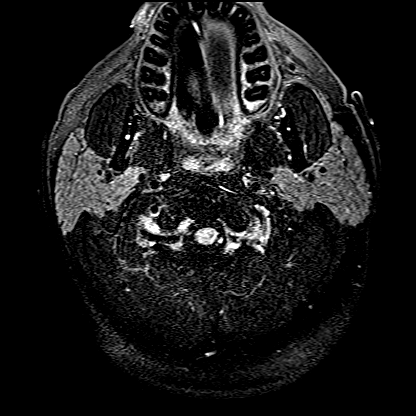
[im 19/55]
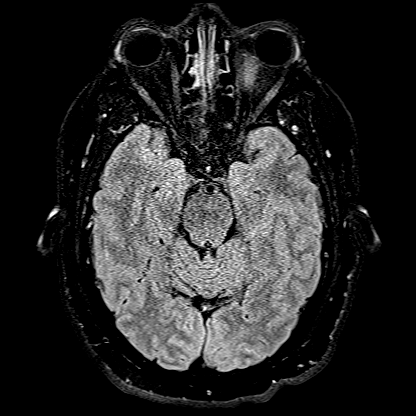
[im 37/55]
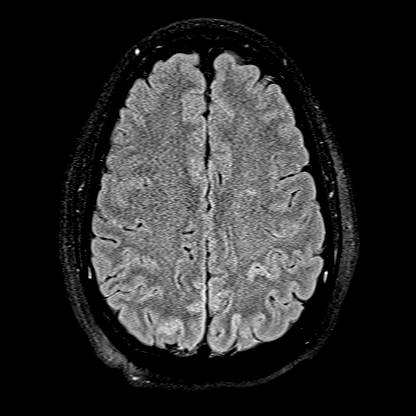
[im 55/55]
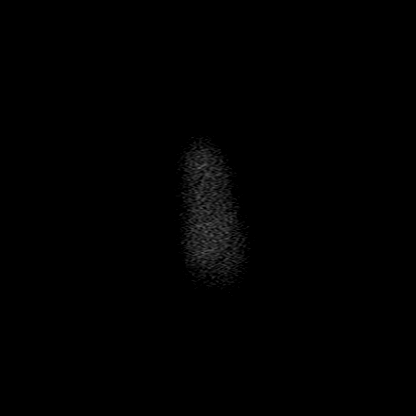

[Series 16: T1 · axial · 1.0mm · 0.98mm/px · z∈[-43,+129]mm · 11 of 176 slices shown (2 of 2)]
[im 1/176]
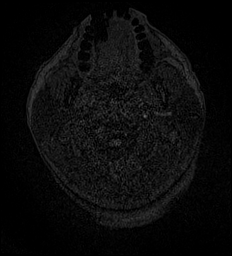
[im 16/176]
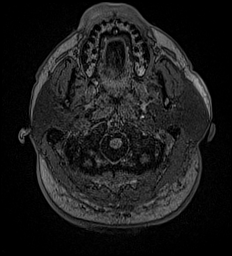
[im 32/176]
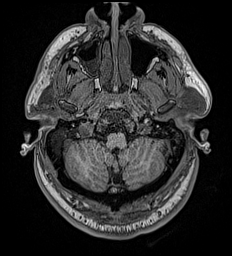
[im 48/176]
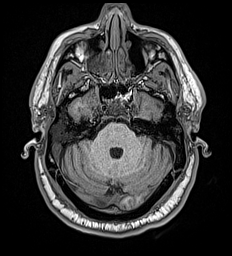
[im 64/176]
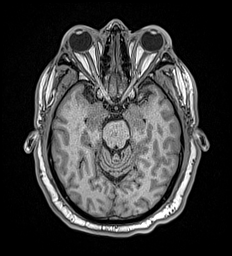
[im 80/176]
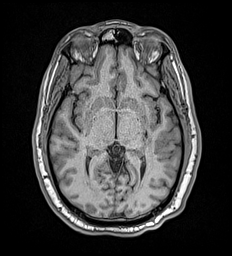
[im 96/176]
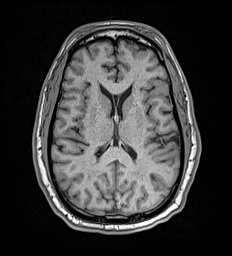
[im 112/176]
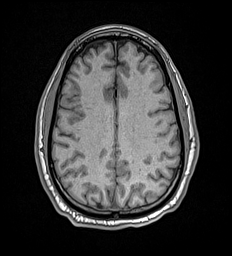
[im 128/176]
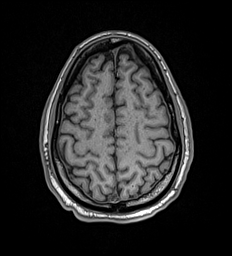
[im 144/176]
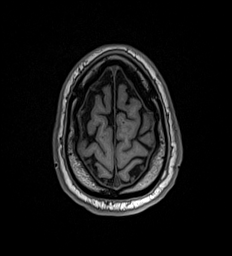
[im 176/176]
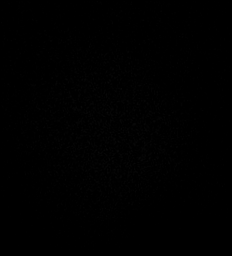

[Series 17: T2 · coronal · 5.0mm · 0.45mm/px · 2 of 31 slices shown (2 of 2)]
[im 1/31]
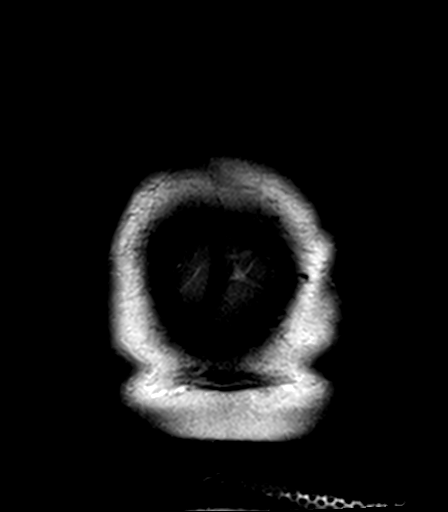
[im 31/31]
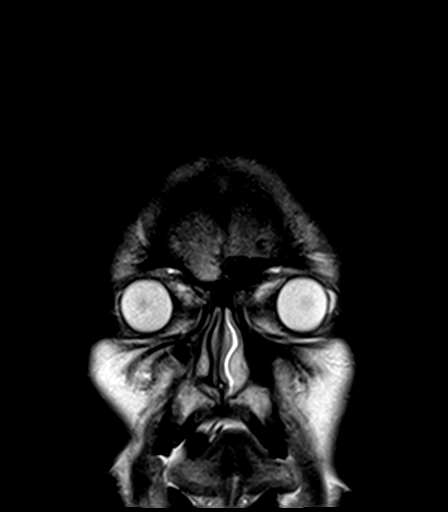

[47 of 48 positions shown; findings below may reference images not displayed]

FINDINGS: Brain: Cortical restricted diffusion along the bilateral ACA-MCA
watershed, bilateral occipital lobes, posterior cingulate, bilateral
caudate and lentiform nucleus, particularly posterior aspect of the
putamen, consistent with hypoxic ischemic injury. Questionable
restricted diffusion in the tail of left hippocampus. Punctate foci
of restricted diffusion are also seen in the left cerebellar
hemisphere, right posterior temporal lobe, medial right frontal lobe
and left occipital lobe. Posterior fossa structures are otherwise
maintained.

No significant mass effect, hydrocephalus, hemorrhage, extra-axial
collection or mass lesion.

Remote lacunar infarct at the genu of the left internal capsule. A
few scattered foci of T2 hyperintensity within the white matter of
the cerebral hemispheres and periventricular white matter of the
left temporal lobe, nonspecific.

Vascular: Normal flow voids.

Skull and upper cervical spine: No focal marrow lesion.

Sinuses/Orbits: Fluid level within the right maxillary sinus and
bilateral mastoid effusion may be related to endotracheal tube. The
orbits are maintained.

Other: None.
IMPRESSION: Findings consistent with hypoxic ischemic injury involving the
bilateral ACA-MCA watershed cortex, bilateral occipital cortex and
basal ganglia.

## 2023-03-01 ENCOUNTER — Telehealth: Payer: Self-pay | Admitting: Acute Care

## 2023-03-01 NOTE — Telephone Encounter (Signed)
We got a referral for a vegetative state trach dependent pt that needs to be followed up with. Wife states trach last changed 12/27/22. Can you please reach out and schedule this patient referral is scanned into media

## 2023-03-21 ENCOUNTER — Telehealth: Payer: Self-pay | Admitting: Pulmonary Disease

## 2023-03-21 NOTE — Telephone Encounter (Signed)
I have reached out to Billy Cherry who is in charge of [facility]. I tried to call referring provider and was put to voicemail.

## 2023-03-21 NOTE — Telephone Encounter (Signed)
Thank you Billy Cherry. Sorry this was incomplete.

## 2023-03-21 NOTE — Telephone Encounter (Signed)
Referring Dr's office calling about this referral . They need "Referral to the trach clinic." As soon as possible. We need to do it as they say the trach clinic

## 2023-03-28 ENCOUNTER — Institutional Professional Consult (permissible substitution): Payer: BC Managed Care – PPO | Admitting: Student in an Organized Health Care Education/Training Program

## 2023-04-02 ENCOUNTER — Inpatient Hospital Stay (HOSPITAL_COMMUNITY)
Admission: RE | Admit: 2023-04-02 | Discharge: 2023-04-02 | Disposition: A | Discharge status: Home / Self Care | Payer: BC Managed Care – PPO | Source: Ambulatory Visit

## 2023-04-02 DIAGNOSIS — G40909 Epilepsy, unspecified, not intractable, without status epilepticus: Secondary | ICD-10-CM

## 2023-04-02 DIAGNOSIS — G931 Anoxic brain damage, not elsewhere classified: Secondary | ICD-10-CM | POA: Diagnosis present

## 2023-04-02 DIAGNOSIS — I5022 Chronic systolic (congestive) heart failure: Secondary | ICD-10-CM | POA: Diagnosis present

## 2023-04-02 DIAGNOSIS — Z930 Tracheostomy status: Secondary | ICD-10-CM

## 2023-04-05 ENCOUNTER — Telehealth: Payer: Self-pay | Admitting: Primary Care

## 2023-04-05 NOTE — Telephone Encounter (Signed)
 disregard

## 2023-04-09 ENCOUNTER — Ambulatory Visit (HOSPITAL_COMMUNITY)
Admission: RE | Admit: 2023-04-09 | Discharge: 2023-04-09 | Disposition: A | Discharge status: Home / Self Care | Payer: BC Managed Care – PPO | Source: Ambulatory Visit | Attending: *Deleted | Admitting: *Deleted

## 2023-04-09 ENCOUNTER — Ambulatory Visit: Payer: BC Managed Care – PPO | Admitting: Podiatry

## 2023-04-09 DIAGNOSIS — Z430 Encounter for attention to tracheostomy: Secondary | ICD-10-CM

## 2023-04-09 DIAGNOSIS — G931 Anoxic brain damage, not elsewhere classified: Secondary | ICD-10-CM | POA: Diagnosis present

## 2023-04-09 DIAGNOSIS — Z930 Tracheostomy status: Secondary | ICD-10-CM | POA: Diagnosis present

## 2023-04-09 NOTE — Consult Note (Signed)
 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  Presents today accompanied by his [facility] in order to get established in trach clinic and have trach changed. Last trach change back in Oct 2024 Pertinent  Medical History  Trach dep  Anoxic brain injury Dysphagia PEG dep HFrEF 30-35% HTN DVT on [name] Seizure d/o gerd   Interim History / Subjective:  Awake. Transferred to clinic via stretcher no distress  Objective   Sats mid 90s on 6 liter simple mask  Examination: General: this is a 53 year old male he is trach dep. Presents in no acute distress HENT: NCAT the trach site clean. Has small amt of granulation tissue at the bottom of the ostomy not draining Lungs: scattered rhonchi no accessory use has clear sputum  Cardiovascular: rrr Abdomen: soft has PEG Extremities: warm Neuro: awake. Not interactive    Assessment & Plan:  Active Problems:   Tracheostomy dependent ([name])   Anoxic brain injury ([name])   Tracheostomy dependent ([name]) Overview: Trach type 6 cuffless Changed today 2/4  DME is adapt   Trach dep   Discussion  Not candidate for decannulation Plan ROV 12 weeks for trach change       Labs   CBC: No results for input(s): WBC, NEUTROABS, HGB, HCT, MCV, PLT in the last 168 hours.  Basic Metabolic Panel: No results for input(s): NA, K, CL, CO2, GLUCOSE, BUN, CREATININE, CALCIUM, MG, PHOS in the last 168 hours. GFR: CrCl cannot be calculated (Patient's most recent lab result is older than the maximum 21 days allowed.). No results for input(s): PROCALCITON, WBC, LATICACIDVEN in the last 168 hours.  Liver Function Tests: No results  for input(s): AST, ALT, ALKPHOS, BILITOT, PROT, ALBUMIN  in the last 168 hours. No results for input(s): LIPASE, AMYLASE in the last 168 hours. No results for input(s): AMMONIA in the last 168 hours.  ABG    Component Value Date/Time   PHART 7.43 03/24/2021 0500   PCO2ART 36 03/24/2021 0500   PO2ART 111 (H) 03/24/2021 0500   HCO3 38.3 (H) 12/27/2022 1126   ACIDBASEDEF 0.3 03/23/2021 0556   O2SAT 92.3 12/27/2022 1126     Coagulation Profile: No results for input(s): INR, PROTIME in the last 168 hours.  Cardiac Enzymes: No results for input(s): CKTOTAL, CKMB, CKMBINDEX, TROPONINI in the last 168 hours.  HbA1C: Hgb A1c MFr Bld  Date/Time Value Ref Range Status  11/10/2021 04:15 PM 5.4 4.8 - 5.6 % Final    Comment:             Prediabetes: 5.7 - 6.4          Diabetes: >6.4          Glycemic control for adults with diabetes: <7.0   03/23/2021 06:23 AM 5.4 4.8 - 5.6 % Final    Comment:    (NOTE) Pre diabetes:          5.7%-6.4%  Diabetes:              >6.4%  Glycemic control for   <7.0% adults with diabetes     CBG: No results for input(s): GLUCAP in the last 168 hours.  Review of Systems:   Not able  Past Medical History:  He,  has a past medical history of Anoxic brain injury ([name]) and Hypertension.   Surgical History:   Past Surgical History:  Procedure Laterality Date   BACK SURGERY     CORONARY/GRAFT ACUTE MI REVASCULARIZATION N/A 03/23/2021   Procedure: Coronary/Graft Acute MI Revascularization;  Surgeon: Ammon Blunt, MD;  Location: [facility] INVASIVE CV LAB;  Service: Cardiovascular;  Laterality: N/A;   IR GASTROSTOMY TUBE MOD SED  04/26/2021   LEFT HEART CATH AND CORONARY ANGIOGRAPHY N/A 03/23/2021   Procedure: LEFT HEART CATH AND CORONARY ANGIOGRAPHY;  Surgeon: Ammon Blunt, MD;  Location: [facility] INVASIVE CV LAB;  Service: Cardiovascular;  Laterality: N/A;   SHOULDER SURGERY       Social History:   reports that  he has never smoked. He has never used smokeless tobacco. He reports current alcohol  use. He reports that he does not use drugs.   Family History:  His family history is not on file.   Allergies Allergies  Allergen Reactions   Zestril  [Lisinopril ] Swelling    angioedema     Home Medications  Prior to Admission medications   Medication Sig Start Date End Date Taking? Authorizing Provider  acetaminophen  (TYLENOL ) 325 MG tablet Take 650 mg by mouth every 6 (six) hours as needed for headache, fever, moderate pain or mild pain.    [provider]  albuterol  (PROVENTIL ) (2.5 MG/3ML) 0.083% nebulizer solution Take 3 mLs (2.5 mg total) by nebulization every 6 (six) hours as needed for wheezing or shortness of breath. Patient taking differently: Take 1 mL by nebulization every 2 (two) hours as needed for wheezing or shortness of breath. 11/11/21   Alen Manus HERO, MD  atropine  1 % ophthalmic solution Place 2 drops under the tongue 4 (four) times daily. Patient taking differently: Place 2 drops under the tongue 4 (four) times daily. 0800/1400/2000/0200 11/07/21   Alen Manus HERO, MD  carvedilol  (COREG ) 6.25 MG tablet Place 1 tablet (6.25 mg total) into feeding tube 2 (two) times daily with a meal. 05/09/22 08/09/22  Alen Manus HERO, MD  chlorhexidine  gluconate, MEDLINE KIT, (PERIDEX ) 0.12 % solution 15 mLs by Mouth Rinse route 2 (two) times daily. 03/30/21   Shellia Inge BIRCH, NP  clonazePAM  (KLONOPIN ) 0.5 MG tablet Place 1 tablet (0.5 mg total) into feeding tube 2 (two) times daily as needed for anxiety. Patient taking differently: Place 0.5 mg into feeding tube 2 (two) times daily. 05/29/22 08/09/22  Alen Manus HERO, MD  doxazosin  (CARDURA ) 2 MG tablet Place 1 tablet (2 mg total) into feeding tube at bedtime. 06/20/22 12/17/22  Alen Manus HERO, MD  ELIQUIS  5 MG TABS tablet PLACE 1 TABLET (5 MG TOTAL) INTO FEEDING TUBE 2 (TWO) TIMES DAILY. 06/20/22   Alen Manus HERO, MD  famotidine  (PEPCID ) 20 MG  tablet PLACE 1 TABLET (20 MG TOTAL) INTO FEEDING TUBE 2 (TWO) TIMES DAILY. 03/20/22 09/16/22  Alen Manus HERO, MD  fiber (NUTRISOURCE FIBER) PACK packet Place 1 packet into feeding tube 2 (two) times daily. Patient not taking: Reported on 08/09/2022 10/19/21   Patsy Lenis, MD  fluticasone  (FLONASE ) 50 MCG/ACT nasal spray Place 2 sprays into both nostrils daily. 12/29/21   Alen Manus HERO, MD  GAVILAX 17 GM/SCOOP powder Place 1 capful (17 g) into feeding tube daily as needed for mild constipation or moderate constipation. Patient taking differently: Place 17 g into feeding tube 2 (two) times daily. 12/29/21   Alen Manus HERO, MD  Glucose 4-6 GM-MG CHEW Give 16  g by tube See admin instructions. Every 15 mins 07/11/22   [provider]  glycopyrrolate  (ROBINUL ) 1 MG tablet Place 1 tablet (1 mg total) into feeding tube 3 (three) times daily. Patient taking differently: Place 1 mg into feeding tube 3 (three) times daily. 0800/1400/2000 11/14/21 08/09/22  Alen Manus HERO, MD  hydrALAZINE  (APRESOLINE ) 50 MG tablet PLACE 1 TABLET (50 MG TOTAL) INTO FEEDING TUBE 3 (THREE) TIMES DAILY. 06/20/22 12/17/22  Alen Manus HERO, MD  hydrocortisone  (ANUSOL -HC) 2.5 % rectal cream Apply 1 Application topically 4 (four) times daily as needed for hemorrhoids. Patient not taking: Reported on 08/09/2022 06/05/22   Patel, Sona, MD  ibuprofen  (ADVIL ) 200 MG tablet Take 200 mg by mouth every 6 (six) hours as needed for fever, headache, mild pain, moderate pain or cramping.    [provider]  ipratropium-albuterol  (DUONEB) 0.5-2.5 (3) MG/3ML SOLN TAKE 3 ML BY NEBULIZATION EVERY 4 HOUR AS NEED (WHEEZING, SHORTNESS OF BREATH). USE FIRST BEFORE ALBUTEROL  Patient not taking: Reported on 08/09/2022 05/09/22   Alen Manus HERO, MD  isosorbide  dinitrate (ISORDIL ) 30 MG tablet Place 1 tablet (30 mg total) into feeding tube 3 (three) times daily. 05/09/22   Alen Manus HERO, MD  LANTUS  SOLOSTAR 100 UNIT/ML Solostar Pen Inject 15  Units into the skin at bedtime. 07/11/22   [provider]  levETIRAcetam  (KEPPRA ) 100 MG/ML solution Place 7.5 mLs (750 mg total) into feeding tube 2 (two) times daily. 12/27/22   Bradler, Evan K, MD  Loperamide  HCl 1 MG/7.5ML LIQD Place 15 mLs (2 mg total) into feeding tube daily as needed for diarrhea or loose stools. Patient not taking: Reported on 08/09/2022 11/14/21   Alen Manus HERO, MD  metoCLOPramide  (REGLAN ) 5 MG/5ML solution Take 10 mg by mouth every 6 (six) hours. 0200/0800/1400/2000    [provider]  Mouthwashes (MOUTH RINSE) LIQD solution 15 mLs by Mouth Rinse route every 4 (four) hours. Patient not taking: Reported on 08/09/2022 03/30/21   Shellia Inge BIRCH, NP  Nebulizer System All-In-One MISC 1 Inhalation by Does not apply route every hour as needed. 11/11/21   Alen Manus HERO, MD  Nutritional Supplements (FEEDING SUPPLEMENT, OSMOLITE 1.5 CAL,) LIQD Place 1,000 mLs into feeding tube continuous. 10/04/21   Jillian Buttery, MD  ondansetron  (ZOFRAN ) 4 MG tablet Take 4 mg by mouth every 6 (six) hours as needed for nausea or vomiting.    [provider]  scopolamine  (TRANSDERM-SCOP) 1 MG/3DAYS PLACE 1 PATCH ONTO THE SKIN EVERY 3 DAYS. 05/09/22   Alen Manus HERO, MD  sodium chloride  HYPERTONIC 3 % nebulizer solution Take 3 mLs by nebulization every 4 (four) hours as needed for other or cough.    [provider]  spironolactone  (ALDACTONE ) 25 MG tablet Place 1 tablet (25 mg total) into feeding tube daily. 05/09/22 08/09/22  Alen Manus HERO, MD  trimethoprim-polymyxin b (POLYTRIM) ophthalmic solution Place 1 drop into both eyes See admin instructions. Every 3 hours for 7-10 days 06/14/22   [provider]  valproic  acid (DEPAKENE ) 250 MG/5ML solution Place 15 mLs (750 mg total) into feeding tube 3 (three) times daily. 05/09/22 11/05/22  Alen Manus HERO, MD  Water  For Irrigation, Sterile (FREE WATER ) SOLN Place 200 mLs into feeding tube every 6 (six) hours. 08/07/21    Pokhrel, Vernal, MD     My time 32 min

## 2023-04-09 NOTE — Progress Notes (Signed)
 Tracheostomy Procedure Note  Billy Cherry [id number] October 14, 1970  Pre Procedure Tracheostomy Information  Trach Brand: Shiley Size:  6.0  3LW24M Style: Uncuffed Secured by: Velcro   Procedure: Trach change and Trach cleaning    Post Procedure Tracheostomy Information  Trach Brand: Shiley Size:  6.0  3LW24M Style: Uncuffed Secured by: Velcro   Post Procedure Evaluation:  ETCO2 positive color change from yellow to purple : Yes.   Vital signsVSS Patients current condition: stable Complications: No apparent complications Trach site exam: clean, dry Wound care done: dry Patient did tolerate procedure well.   Education: none  Prescription needs: none    Additional needs: none

## 2023-04-11 NOTE — Telephone Encounter (Signed)
 Hello- Was this completed

## 2023-04-12 NOTE — Telephone Encounter (Signed)
 This patient hasn't been seen  here at the  [place] office

## 2023-04-16 ENCOUNTER — Ambulatory Visit: Payer: Medicaid Other | Admitting: Podiatry

## 2023-04-18 ENCOUNTER — Inpatient Hospital Stay
Admission: EM | Admit: 2023-04-18 | Discharge: 2023-05-04 | DRG: 208 | Disposition: E | Payer: Medicaid Other | Attending: Internal Medicine | Admitting: Internal Medicine

## 2023-04-18 ENCOUNTER — Emergency Department: Payer: Medicaid Other

## 2023-04-18 DIAGNOSIS — Z930 Tracheostomy status: Secondary | ICD-10-CM | POA: Diagnosis not present

## 2023-04-18 DIAGNOSIS — L89152 Pressure ulcer of sacral region, stage 2: Secondary | ICD-10-CM | POA: Diagnosis present

## 2023-04-18 DIAGNOSIS — N179 Acute kidney failure, unspecified: Secondary | ICD-10-CM | POA: Diagnosis not present

## 2023-04-18 DIAGNOSIS — I2510 Atherosclerotic heart disease of native coronary artery without angina pectoris: Secondary | ICD-10-CM | POA: Diagnosis present

## 2023-04-18 DIAGNOSIS — I10 Essential (primary) hypertension: Secondary | ICD-10-CM | POA: Diagnosis present

## 2023-04-18 DIAGNOSIS — I2699 Other pulmonary embolism without acute cor pulmonale: Secondary | ICD-10-CM | POA: Diagnosis not present

## 2023-04-18 DIAGNOSIS — Z888 Allergy status to other drugs, medicaments and biological substances status: Secondary | ICD-10-CM

## 2023-04-18 DIAGNOSIS — Z66 Do not resuscitate: Secondary | ICD-10-CM | POA: Diagnosis not present

## 2023-04-18 DIAGNOSIS — B952 Enterococcus as the cause of diseases classified elsewhere: Secondary | ICD-10-CM | POA: Diagnosis present

## 2023-04-18 DIAGNOSIS — I5023 Acute on chronic systolic (congestive) heart failure: Secondary | ICD-10-CM | POA: Diagnosis present

## 2023-04-18 DIAGNOSIS — I4721 Torsades de pointes: Secondary | ICD-10-CM | POA: Diagnosis not present

## 2023-04-18 DIAGNOSIS — I469 Cardiac arrest, cause unspecified: Secondary | ICD-10-CM | POA: Diagnosis not present

## 2023-04-18 DIAGNOSIS — D649 Anemia, unspecified: Secondary | ICD-10-CM | POA: Insufficient documentation

## 2023-04-18 DIAGNOSIS — J9621 Acute and chronic respiratory failure with hypoxia: Secondary | ICD-10-CM | POA: Diagnosis present

## 2023-04-18 DIAGNOSIS — Z931 Gastrostomy status: Secondary | ICD-10-CM

## 2023-04-18 DIAGNOSIS — Z86711 Personal history of pulmonary embolism: Secondary | ICD-10-CM

## 2023-04-18 DIAGNOSIS — G40909 Epilepsy, unspecified, not intractable, without status epilepticus: Secondary | ICD-10-CM

## 2023-04-18 DIAGNOSIS — J189 Pneumonia, unspecified organism: Secondary | ICD-10-CM | POA: Diagnosis not present

## 2023-04-18 DIAGNOSIS — I471 Supraventricular tachycardia, unspecified: Secondary | ICD-10-CM | POA: Diagnosis not present

## 2023-04-18 DIAGNOSIS — Z8669 Personal history of other diseases of the nervous system and sense organs: Secondary | ICD-10-CM

## 2023-04-18 DIAGNOSIS — E1165 Type 2 diabetes mellitus with hyperglycemia: Secondary | ICD-10-CM | POA: Diagnosis present

## 2023-04-18 DIAGNOSIS — Z794 Long term (current) use of insulin: Secondary | ICD-10-CM | POA: Diagnosis not present

## 2023-04-18 DIAGNOSIS — R509 Fever, unspecified: Secondary | ICD-10-CM | POA: Diagnosis not present

## 2023-04-18 DIAGNOSIS — I5021 Acute systolic (congestive) heart failure: Secondary | ICD-10-CM | POA: Diagnosis not present

## 2023-04-18 DIAGNOSIS — J151 Pneumonia due to Pseudomonas: Principal | ICD-10-CM | POA: Diagnosis present

## 2023-04-18 DIAGNOSIS — E876 Hypokalemia: Secondary | ICD-10-CM | POA: Diagnosis not present

## 2023-04-18 DIAGNOSIS — J9602 Acute respiratory failure with hypercapnia: Secondary | ICD-10-CM | POA: Diagnosis not present

## 2023-04-18 DIAGNOSIS — N184 Chronic kidney disease, stage 4 (severe): Secondary | ICD-10-CM | POA: Diagnosis not present

## 2023-04-18 DIAGNOSIS — E8809 Other disorders of plasma-protein metabolism, not elsewhere classified: Secondary | ICD-10-CM | POA: Diagnosis present

## 2023-04-18 DIAGNOSIS — I428 Other cardiomyopathies: Secondary | ICD-10-CM

## 2023-04-18 DIAGNOSIS — J690 Pneumonitis due to inhalation of food and vomit: Secondary | ICD-10-CM | POA: Diagnosis present

## 2023-04-18 DIAGNOSIS — E1122 Type 2 diabetes mellitus with diabetic chronic kidney disease: Secondary | ICD-10-CM | POA: Diagnosis present

## 2023-04-18 DIAGNOSIS — N189 Chronic kidney disease, unspecified: Secondary | ICD-10-CM | POA: Diagnosis not present

## 2023-04-18 DIAGNOSIS — I4901 Ventricular fibrillation: Secondary | ICD-10-CM | POA: Diagnosis not present

## 2023-04-18 DIAGNOSIS — I4891 Unspecified atrial fibrillation: Secondary | ICD-10-CM | POA: Diagnosis present

## 2023-04-18 DIAGNOSIS — E669 Obesity, unspecified: Secondary | ICD-10-CM | POA: Diagnosis present

## 2023-04-18 DIAGNOSIS — Y95 Nosocomial condition: Secondary | ICD-10-CM | POA: Diagnosis present

## 2023-04-18 DIAGNOSIS — Z6836 Body mass index (BMI) 36.0-36.9, adult: Secondary | ICD-10-CM

## 2023-04-18 DIAGNOSIS — G931 Anoxic brain damage, not elsewhere classified: Secondary | ICD-10-CM | POA: Diagnosis present

## 2023-04-18 DIAGNOSIS — D509 Iron deficiency anemia, unspecified: Secondary | ICD-10-CM | POA: Insufficient documentation

## 2023-04-18 DIAGNOSIS — R569 Unspecified convulsions: Secondary | ICD-10-CM | POA: Diagnosis not present

## 2023-04-18 DIAGNOSIS — Z1152 Encounter for screening for COVID-19: Secondary | ICD-10-CM | POA: Diagnosis not present

## 2023-04-18 DIAGNOSIS — N170 Acute kidney failure with tubular necrosis: Secondary | ICD-10-CM | POA: Diagnosis present

## 2023-04-18 DIAGNOSIS — I130 Hypertensive heart and chronic kidney disease with heart failure and stage 1 through stage 4 chronic kidney disease, or unspecified chronic kidney disease: Secondary | ICD-10-CM | POA: Diagnosis present

## 2023-04-18 DIAGNOSIS — Z7984 Long term (current) use of oral hypoglycemic drugs: Secondary | ICD-10-CM

## 2023-04-18 DIAGNOSIS — I824Y9 Acute embolism and thrombosis of unspecified deep veins of unspecified proximal lower extremity: Secondary | ICD-10-CM | POA: Diagnosis not present

## 2023-04-18 DIAGNOSIS — N182 Chronic kidney disease, stage 2 (mild): Secondary | ICD-10-CM | POA: Diagnosis present

## 2023-04-18 DIAGNOSIS — I959 Hypotension, unspecified: Secondary | ICD-10-CM

## 2023-04-18 DIAGNOSIS — I509 Heart failure, unspecified: Secondary | ICD-10-CM | POA: Diagnosis not present

## 2023-04-18 DIAGNOSIS — Z7401 Bed confinement status: Secondary | ICD-10-CM

## 2023-04-18 DIAGNOSIS — Z8701 Personal history of pneumonia (recurrent): Secondary | ICD-10-CM

## 2023-04-18 DIAGNOSIS — Z5986 Financial insecurity: Secondary | ICD-10-CM

## 2023-04-18 DIAGNOSIS — R0602 Shortness of breath: Secondary | ICD-10-CM | POA: Diagnosis not present

## 2023-04-18 DIAGNOSIS — B9689 Other specified bacterial agents as the cause of diseases classified elsewhere: Secondary | ICD-10-CM | POA: Diagnosis not present

## 2023-04-18 DIAGNOSIS — Z86718 Personal history of other venous thrombosis and embolism: Secondary | ICD-10-CM

## 2023-04-18 DIAGNOSIS — I5043 Acute on chronic combined systolic (congestive) and diastolic (congestive) heart failure: Secondary | ICD-10-CM | POA: Diagnosis not present

## 2023-04-18 DIAGNOSIS — J9601 Acute respiratory failure with hypoxia: Secondary | ICD-10-CM | POA: Diagnosis not present

## 2023-04-18 DIAGNOSIS — J9622 Acute and chronic respiratory failure with hypercapnia: Secondary | ICD-10-CM | POA: Diagnosis present

## 2023-04-18 DIAGNOSIS — B964 Proteus (mirabilis) (morganii) as the cause of diseases classified elsewhere: Secondary | ICD-10-CM | POA: Diagnosis present

## 2023-04-18 DIAGNOSIS — I82409 Acute embolism and thrombosis of unspecified deep veins of unspecified lower extremity: Secondary | ICD-10-CM | POA: Diagnosis present

## 2023-04-18 DIAGNOSIS — E875 Hyperkalemia: Principal | ICD-10-CM | POA: Diagnosis present

## 2023-04-18 DIAGNOSIS — A419 Sepsis, unspecified organism: Secondary | ICD-10-CM

## 2023-04-18 DIAGNOSIS — Z7901 Long term (current) use of anticoagulants: Secondary | ICD-10-CM

## 2023-04-18 DIAGNOSIS — D508 Other iron deficiency anemias: Secondary | ICD-10-CM | POA: Diagnosis not present

## 2023-04-18 LAB — CBC WITH DIFFERENTIAL/PLATELET
Abs Immature Granulocytes: 0.09 10*3/uL — ABNORMAL HIGH (ref 0.00–0.07)
Basophils Absolute: 0 10*3/uL (ref 0.0–0.1)
Basophils Relative: 0 %
Eosinophils Absolute: 0.1 10*3/uL (ref 0.0–0.5)
Eosinophils Relative: 0 %
HCT: 32.4 % — ABNORMAL LOW (ref 39.0–52.0)
Hemoglobin: 9.8 g/dL — ABNORMAL LOW (ref 13.0–17.0)
Immature Granulocytes: 1 %
Lymphocytes Relative: 8 %
Lymphs Abs: 1.3 10*3/uL (ref 0.7–4.0)
MCH: 33.4 pg (ref 26.0–34.0)
MCHC: 30.2 g/dL (ref 30.0–36.0)
MCV: 110.6 fL — ABNORMAL HIGH (ref 80.0–100.0)
Monocytes Absolute: 2.3 10*3/uL — ABNORMAL HIGH (ref 0.1–1.0)
Monocytes Relative: 14 %
Neutro Abs: 13.2 10*3/uL — ABNORMAL HIGH (ref 1.7–7.7)
Neutrophils Relative %: 77 %
Platelets: 179 10*3/uL (ref 150–400)
RBC: 2.93 MIL/uL — ABNORMAL LOW (ref 4.22–5.81)
RDW: 15.5 % (ref 11.5–15.5)
WBC: 17 10*3/uL — ABNORMAL HIGH (ref 4.0–10.5)
nRBC: 0 % (ref 0.0–0.2)

## 2023-04-18 LAB — BRAIN NATRIURETIC PEPTIDE: B Natriuretic Peptide: 349.5 pg/mL — ABNORMAL HIGH (ref 0.0–100.0)

## 2023-04-18 LAB — BASIC METABOLIC PANEL
Anion gap: 9 (ref 5–15)
BUN: 62 mg/dL — ABNORMAL HIGH (ref 6–20)
CO2: 32 mmol/L (ref 22–32)
Calcium: 9.1 mg/dL (ref 8.9–10.3)
Chloride: 101 mmol/L (ref 98–111)
Creatinine, Ser: 1.26 mg/dL — ABNORMAL HIGH (ref 0.61–1.24)
GFR, Estimated: >60 mL/min (ref >60)
Glucose, Bld: 192 mg/dL — ABNORMAL HIGH (ref 70–99)
Potassium: 5.8 mmol/L — ABNORMAL HIGH (ref 3.5–5.1)
Sodium: 142 mmol/L (ref 135–145)

## 2023-04-18 LAB — LACTIC ACID, PLASMA
Lactic Acid, Venous: 2 mmol/L (ref 0.5–1.9)
Lactic Acid, Venous: 1.9 mmol/L (ref 0.5–1.9)

## 2023-04-18 LAB — TROPONIN I (HIGH SENSITIVITY)
Troponin I (High Sensitivity): 15 ng/L (ref <18)
Troponin I (High Sensitivity): 16 ng/L (ref <18)

## 2023-04-18 LAB — RESP PANEL BY RT-PCR (RSV, FLU A&B, COVID)  RVPGX2
Influenza A by PCR: NEGATIVE
Influenza B by PCR: NEGATIVE
Resp Syncytial Virus by PCR: NEGATIVE
SARS Coronavirus 2 by RT PCR: NEGATIVE

## 2023-04-18 LAB — EXPECTORATED SPUTUM ASSESSMENT W GRAM STAIN, RFLX TO RESP C

## 2023-04-18 LAB — PROCALCITONIN: Procalcitonin: 0.17 ng/mL

## 2023-04-18 LAB — CBG MONITORING, ED: Glucose-Capillary: 147 mg/dL — ABNORMAL HIGH (ref 70–99)

## 2023-04-18 MED ORDER — SPIRONOLACTONE 25 MG PO TABS: 25.0000 mg | ORAL_TABLET | Freq: Every day | ORAL | Status: DC

## 2023-04-18 MED ORDER — FAMOTIDINE 20 MG PO TABS
20.0000 mg | ORAL_TABLET | Freq: Two times a day (BID) | ORAL | Status: DC
Start: 2023-04-18 — End: 2023-05-01
  Administered 2023-04-18 – 2023-05-01 (×25): 20 mg
  Filled 2023-04-18 (×26): qty 1

## 2023-04-18 MED ORDER — HYDRALAZINE HCL 50 MG PO TABS: 50.0000 mg | ORAL_TABLET | Freq: Three times a day (TID) | ORAL | Status: DC

## 2023-04-18 MED ORDER — GLYCOPYRROLATE 1 MG PO TABS
1.0000 mg | ORAL_TABLET | Freq: Three times a day (TID) | ORAL | Status: DC
  Administered 2023-04-19 – 2023-05-01 (×35): 1 mg
  Filled 2023-04-18 (×42): qty 1

## 2023-04-18 MED ORDER — POLYETHYLENE GLYCOL 3350 17 G PO PACK
17.0000 g | PACK | Freq: Two times a day (BID) | ORAL | Status: DC
  Administered 2023-04-20 – 2023-04-21 (×3): 17 g via ORAL
  Filled 2023-04-18 (×6): qty 1

## 2023-04-18 MED ORDER — METOCLOPRAMIDE HCL 5 MG/5ML PO SOLN: 10.0000 mg | Freq: Four times a day (QID) | ORAL | Status: DC | PRN

## 2023-04-18 MED ORDER — ISOSORBIDE DINITRATE 30 MG PO TABS
30.0000 mg | ORAL_TABLET | Freq: Three times a day (TID) | ORAL | Status: DC
  Filled 2023-04-18: qty 1

## 2023-04-18 MED ORDER — VANCOMYCIN HCL IN DEXTROSE 1-5 GM/200ML-% IV SOLN
1000.0000 mg | Freq: Once | INTRAVENOUS | Status: AC
  Administered 2023-04-18: 1000 mg via INTRAVENOUS
  Filled 2023-04-18: qty 200

## 2023-04-18 MED ORDER — IPRATROPIUM-ALBUTEROL 0.5-2.5 (3) MG/3ML IN SOLN
3.0000 mL | RESPIRATORY_TRACT | Status: DC
  Administered 2023-04-18 – 2023-04-21 (×14): 3 mL via RESPIRATORY_TRACT
  Filled 2023-04-18 (×13): qty 3

## 2023-04-18 MED ORDER — INSULIN GLARGINE-YFGN 100 UNIT/ML ~~LOC~~ SOLN
10.0000 [IU] | Freq: Every day | SUBCUTANEOUS | Status: DC
  Administered 2023-04-18 – 2023-04-27 (×10): 10 [IU] via SUBCUTANEOUS
  Filled 2023-04-18 (×11): qty 0.1

## 2023-04-18 MED ORDER — METOCLOPRAMIDE HCL 10 MG/10ML PO SOLN
10.0000 mg | Freq: Three times a day (TID) | ORAL | Status: DC
  Administered 2023-04-19 – 2023-04-30 (×33): 10 mg
  Filled 2023-04-18 (×43): qty 10

## 2023-04-18 MED ORDER — SODIUM CHLORIDE 0.9 % IV SOLN
2.0000 g | Freq: Once | INTRAVENOUS | Status: AC
  Administered 2023-04-18: 2 g via INTRAVENOUS
  Filled 2023-04-18: qty 12.5

## 2023-04-18 MED ORDER — GUAIFENESIN 100 MG/5ML PO LIQD
200.0000 mg | ORAL | Status: DC | PRN
  Administered 2023-04-21 – 2023-04-23 (×3): 200 mg
  Filled 2023-04-18 (×3): qty 10

## 2023-04-18 MED ORDER — ALBUTEROL SULFATE (2.5 MG/3ML) 0.083% IN NEBU
2.5000 mg | INHALATION_SOLUTION | RESPIRATORY_TRACT | Status: DC | PRN
Start: 2023-04-18 — End: 2023-05-01

## 2023-04-18 MED ORDER — VALPROIC ACID 250 MG/5ML PO SOLN
750.0000 mg | Freq: Three times a day (TID) | ORAL | Status: DC
  Administered 2023-04-18 – 2023-05-01 (×37): 750 mg
  Filled 2023-04-18 (×41): qty 15

## 2023-04-18 MED ORDER — INSULIN ASPART 100 UNIT/ML IJ SOLN: 0.0000 [IU] | Freq: Every day | INTRAMUSCULAR | Status: DC

## 2023-04-18 MED ORDER — ACETAMINOPHEN 160 MG/5ML PO SOLN
650.0000 mg | Freq: Four times a day (QID) | ORAL | Status: DC | PRN
  Administered 2023-04-23 – 2023-04-24 (×3): 650 mg
  Filled 2023-04-18 (×5): qty 20.3

## 2023-04-18 MED ORDER — LORAZEPAM 2 MG/ML IJ SOLN: 2.0000 mg | INTRAMUSCULAR | Status: DC | PRN

## 2023-04-18 MED ORDER — APIXABAN 5 MG PO TABS
5.0000 mg | ORAL_TABLET | Freq: Two times a day (BID) | ORAL | Status: DC
  Administered 2023-04-19 – 2023-05-01 (×24): 5 mg
  Filled 2023-04-18 (×25): qty 1

## 2023-04-18 MED ORDER — INSULIN ASPART 100 UNIT/ML IJ SOLN
0.0000 [IU] | Freq: Three times a day (TID) | INTRAMUSCULAR | Status: DC
  Administered 2023-04-19: 1 [IU] via SUBCUTANEOUS
  Administered 2023-04-19 (×2): 2 [IU] via SUBCUTANEOUS
  Administered 2023-04-20: 1 [IU] via SUBCUTANEOUS
  Administered 2023-04-20: 2 [IU] via SUBCUTANEOUS
  Administered 2023-04-20: 1 [IU] via SUBCUTANEOUS
  Filled 2023-04-18 (×6): qty 1

## 2023-04-18 MED ORDER — DOXAZOSIN MESYLATE 2 MG PO TABS
2.0000 mg | ORAL_TABLET | Freq: Every day | ORAL | Status: DC
  Administered 2023-04-19 – 2023-04-20 (×2): 2 mg
  Filled 2023-04-18 (×3): qty 1

## 2023-04-18 MED ORDER — CARVEDILOL 6.25 MG PO TABS
6.2500 mg | ORAL_TABLET | Freq: Two times a day (BID) | ORAL | Status: DC
  Administered 2023-04-19 – 2023-04-21 (×4): 6.25 mg
  Filled 2023-04-18 (×5): qty 1

## 2023-04-18 MED ORDER — SODIUM ZIRCONIUM CYCLOSILICATE 10 G PO PACK
10.0000 g | PACK | Freq: Once | ORAL | Status: DC
  Filled 2023-04-18: qty 1

## 2023-04-18 MED ORDER — HYDRALAZINE HCL 20 MG/ML IJ SOLN: 5.0000 mg | INTRAMUSCULAR | Status: DC | PRN

## 2023-04-18 MED ORDER — FUROSEMIDE 10 MG/ML IJ SOLN
40.0000 mg | Freq: Once | INTRAMUSCULAR | Status: AC
Start: 2023-04-18 — End: 2023-04-18
  Administered 2023-04-18: 40 mg via INTRAVENOUS
  Filled 2023-04-18: qty 4

## 2023-04-18 MED ORDER — CLONAZEPAM 0.5 MG PO TABS
0.5000 mg | ORAL_TABLET | Freq: Two times a day (BID) | ORAL | Status: DC
  Administered 2023-04-19 – 2023-05-01 (×25): 0.5 mg
  Filled 2023-04-18 (×25): qty 1

## 2023-04-18 MED ORDER — OSMOLITE 1.2 CAL PO LIQD
1000.0000 mL | ORAL | Status: DC
  Administered 2023-04-18: 1000 mL

## 2023-04-18 MED ORDER — FREE WATER
200.0000 mL | Freq: Four times a day (QID) | Status: DC
  Administered 2023-04-18 – 2023-04-29 (×42): 200 mL
  Filled 2023-04-18 (×5): qty 200

## 2023-04-18 MED ORDER — ONDANSETRON HCL 4 MG/2ML IJ SOLN: 4.0000 mg | Freq: Three times a day (TID) | INTRAMUSCULAR | Status: DC | PRN

## 2023-04-18 MED ORDER — LEVETIRACETAM 100 MG/ML PO SOLN
750.0000 mg | Freq: Two times a day (BID) | ORAL | Status: DC
  Administered 2023-04-18 – 2023-04-22 (×7): 750 mg
  Filled 2023-04-18 (×10): qty 7.5

## 2023-04-18 MED ORDER — ATROPINE SULFATE 1 % OP SOLN
2.0000 [drp] | Freq: Four times a day (QID) | OPHTHALMIC | Status: DC
  Administered 2023-04-19 – 2023-05-01 (×47): 2 [drp] via SUBLINGUAL
  Filled 2023-04-18 (×3): qty 2

## 2023-04-18 NOTE — ED Provider Notes (Signed)
[facility] Provider Note    Event Date/Time   First MD Initiated Contact with Patient 04/18/23 1521     (approximate)   History   No chief complaint on file.   HPI  Billy Cherry is a 53 y.o. male with history of tracheostomy, PEG who secondary to anoxic brain injury due to V-fib arrest due to angioedema from ACE inhibitor back in 2023, A-fib on Eliquis, CHF with a EF of 30% who comes in with concerns for shortness of breath.  Patient is nonverbal with minimal meaningful movement not able to follow commands who came in with concerns for decreased oxygen levels.  Patient typically sats around 95% according to family his saturations are gone down to 88%.  He also had a fever yesterday but no fever today.  When EMS evaluated patient his oxygen levels were reassuring but they wanted patient to come in to be evaluated to make sure there is nothing going on.   Physical Exam   Triage Vital Signs: ED Triage Vitals  Encounter Vitals Group     BP      Systolic BP Percentile      Diastolic BP Percentile      Pulse      Resp      Temp      Temp src      SpO2      Weight      Height      Head Circumference      Peak Flow      Pain Score      Pain Loc      Pain Education      Exclude from Growth Chart     Most recent vital signs: Vitals:   04/18/23 1527 04/18/23 1556  BP: (!) 123/102   Pulse: (!) 105   Resp: 20   Temp: 98.9 F (37.2 C)   SpO2: 99% 97%     General: Awake, no distress.  CV:  Good peripheral perfusion.  Resp:  Normal effort.  Coarse breath sounds Abd:  No distention.  Other:  Patient's eyes are open but does not follow any commands.  He is got tracheostomy noted on baseline 7 L of oxygen.  He is got a little bit of secretions noted from the tracheostomy tube.   ED Results / Procedures / Treatments   Labs (all labs ordered are listed, but only abnormal results are displayed) Labs Reviewed  RESP PANEL BY RT-PCR (RSV, FLU A&B,  COVID)  [id number]  CBC WITH DIFFERENTIAL/PLATELET  BASIC METABOLIC PANEL  BRAIN NATRIURETIC PEPTIDE  PROCALCITONIN  TROPONIN I (HIGH SENSITIVITY)     EKG  My interpretation of EKG:  Sinus tachycardia rate of 108 without any ST elevation, T wave versions in V5 and V6, normal intervals  RADIOLOGY I have reviewed the xray personally and interpreted positive by basilar opacities concerning for pneumonia  PROCEDURES:  Critical Care performed: No  .1-3 Lead EKG Interpretation  Performed by: Concha Se, MD Authorized by: Concha Se, MD     Interpretation: abnormal     ECG rate:  105   ECG rate assessment: tachycardic     Rhythm: sinus tachycardia     Ectopy: none     Conduction: normal   .Critical Care  Performed by: Concha Se, MD Authorized by: Concha Se, MD   Critical care provider statement:    Critical care time (minutes):  30   Critical care was necessary  to treat or prevent imminent or life-threatening deterioration of the following conditions:  Sepsis   Critical care was time spent personally by me on the following activities:  Development of treatment plan with patient or surrogate, discussions with consultants, evaluation of patient's response to treatment, examination of patient, ordering and review of laboratory studies, ordering and review of radiographic studies, ordering and performing treatments and interventions, pulse oximetry, re-evaluation of patient's condition and review of old charts    MEDICATIONS ORDERED IN ED: Medications  vancomycin (VANCOCIN) IVPB 1000 mg/200 mL premix (has no administration in time range)  ceFEPIme (MAXIPIME) 2 g in sodium chloride 0.9 % 100 mL IVPB (has no administration in time range)  furosemide (LASIX) injection 40 mg (has no administration in time range)  sodium zirconium cyclosilicate (LOKELMA) packet 10 g (has no administration in time range)     IMPRESSION / MDM / ASSESSMENT AND PLAN / ED COURSE  I reviewed  the triage vital signs and the nursing notes.   Patient's presentation is most consistent with acute presentation with potential threat to life or bodily function.   Patient comes in tachycardic with concern for some fevers at home.  Patient afebrile here.  Oxygen levels are reassuring on baseline oxygen.  Labs ordered evaluate for ACS, COVID, flu, CHF.  Chest x-ray evaluate for any pneumonia, pneumothorax.  Doubt PE given patient is on blood thinner  4:13 PM reevaluated patient.  Patient family is at bedside stating that he has had some increasing heart rates and some increasing swelling that they noticed.  He is only on spironolactone.  They also stated that they gave some Tylenol this morning but have not given any Tylenol since 8 AM.  BNP is elevated from baseline and family reports that he has been more swollen therefore we will hold off on fluids and give patient Lasix.  His potassium is elevated at 5.8 this will help lower the potassium but also give a dose of Lokelma.  His troponins are negative x 2 procalcitonin is elevated.  COVID flu are negative.  Given patient meets sepsis criteria with elevated white count and elevated heart rate sepsis alert was called.  I will discuss with hospital team for admission for pneumonia.  Patient is on his baseline oxygen.  The patient is on the cardiac monitor to evaluate for evidence of arrhythmia and/or significant heart rate changes.      FINAL CLINICAL IMPRESSION(S) / ED DIAGNOSES   Final diagnoses:  Hyperkalemia  Acute on chronic congestive heart failure, unspecified heart failure type ([name])  Pneumonia due to infectious organism, unspecified laterality, unspecified part of lung  Sepsis, due to unspecified organism, unspecified whether acute organ dysfunction present [facility])     Rx / DC Orders   ED Discharge Orders     None        Note:  This document was prepared using Dragon voice recognition software and may include unintentional  dictation errors.   Concha Se, MD 04/18/23 [phone]

## 2023-04-18 NOTE — ED Notes (Signed)
Patient spouse at the bedside questioning trach collar, humidity, and functioning. Patient saturations 93-95% on trach collar with humidity. Occasional saturation readings 89% with immediate return to mid-low 90's. RT notified of family concern. Pt spouse aggressive communication and demanding to staff.

## 2023-04-18 NOTE — H&P (Incomplete)
History and Physical    Billy Cherry ZOX:[id number] DOB: Jul 07, 1970 DOA: 04/18/2023  Referring MD/NP/PA:   PCP: [facility]   Patient coming from:  The patient is coming from home.     Chief Complaint: SOB  HPI: Billy Cherry is a 53 y.o. 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  Patient is nonverbal.  Per her wife at the bedside, patient has increased shortness of breath with increased tracheal secretion in the past several days.  No fever or chills.  Patient has increased fluid retention, particularly in arms per her wife.  Patient does not see to have chest pain, nausea, vomiting, abdominal pain per his wife.  Patient was found to have oxygen desaturation to 88% on room air, which improved to 99% of 4L -6L oxygen in ED.   Data reviewed independently and ED Course: pt was found to have BNP 349, potassium 5.8, GFR> 60, WBC 17.1, negative PCR for flu, COVID and RSV, troponin 13 --> 15, procalcitonin 0.17.  Chest x-ray showed bilateral basilar opacity (the left is worse than the right).  Temperature 99.1, blood pressure 125/100, heart rate of 108, RR 22.  Patient is admitted to PCU as inpatient.   EKG: I have personally reviewed.  Sinus rhythm, QTc 477, LAD, poor R wave progression, T wave inversion in lateral leads.   Review of Systems: Could not be reviewed since patient is nonverbal.  Allergy:  Allergies  Allergen Reactions  . Lisinopril Swelling    angioedema  Other Reaction(s): angioedema    Past Medical History:  Diagnosis Date  . Anoxic brain injury ([name])   . Hypertension     Past Surgical History:  Procedure Laterality Date  . BACK SURGERY    . CORONARY/GRAFT ACUTE MI REVASCULARIZATION N/A 03/23/2021   Procedure:  Coronary/Graft Acute MI Revascularization;  Surgeon: Marcina Millard, MD;  Location: [facility] INVASIVE CV LAB;  Service: Cardiovascular;  Laterality: N/A;  . IR GASTROSTOMY TUBE MOD SED  04/26/2021  . LEFT HEART CATH AND CORONARY ANGIOGRAPHY N/A 03/23/2021   Procedure: LEFT HEART CATH AND CORONARY ANGIOGRAPHY;  Surgeon: Marcina Millard, MD;  Location: [facility] INVASIVE CV LAB;  Service: Cardiovascular;  Laterality: N/A;  . SHOULDER SURGERY      Social History:  reports that he has never smoked. He has never used smokeless tobacco. He reports current alcohol use. He reports that he does not use drugs.  Family History: Could not be reviewed since patient is nonverbal  Prior to Admission medications   Medication Sig Start Date End Date Taking? Authorizing Provider  acetaminophen (TYLENOL) 325 MG tablet Take 650 mg by mouth every 6 (six) hours as needed for headache, fever, moderate pain or mild pain.    [provider]  albuterol (PROVENTIL) (2.5 MG/3ML) 0.083% nebulizer solution Take 3 mLs (2.5 mg total) by nebulization every 6 (six) hours as needed for wheezing or shortness of breath. Patient taking differently: Take 1 mL by nebulization every 2 (two) hours as needed for wheezing or shortness of breath. 11/11/21   Haydee Salter, MD  atropine 1 % ophthalmic solution Place 2 drops under the tongue 4 (four) times daily. Patient taking differently: Place 2 drops under the tongue 4 (four) times daily. 0800/1400/2000/0200 11/07/21   Haydee Salter, MD  carvedilol (COREG) 6.25 MG  tablet Place 1 tablet (6.25 mg total) into feeding tube 2 (two) times daily with a meal. 05/09/22 08/09/22  Haydee Salter, MD  chlorhexidine gluconate, MEDLINE KIT, (PERIDEX) 0.12 % solution 15 mLs by Mouth Rinse route 2 (two) times daily. 03/30/21   Judithe Modest, NP  clonazePAM (KLONOPIN) 0.5 MG tablet Place 1 tablet (0.5 mg total) into feeding tube 2 (two) times daily as needed for anxiety. Patient taking  differently: Place 0.5 mg into feeding tube 2 (two) times daily. 05/29/22 08/09/22  Haydee Salter, MD  doxazosin (CARDURA) 2 MG tablet Place 1 tablet (2 mg total) into feeding tube at bedtime. 06/20/22 12/17/22  Haydee Salter, MD  ELIQUIS 5 MG TABS tablet PLACE 1 TABLET (5 MG TOTAL) INTO FEEDING TUBE 2 (TWO) TIMES DAILY. 06/20/22   Haydee Salter, MD  famotidine (PEPCID) 20 MG tablet PLACE 1 TABLET (20 MG TOTAL) INTO FEEDING TUBE 2 (TWO) TIMES DAILY. 03/20/22 09/16/22  Haydee Salter, MD  fiber (NUTRISOURCE FIBER) PACK packet Place 1 packet into feeding tube 2 (two) times daily. Patient not taking: Reported on 08/09/2022 10/19/21   Lewie Chamber, MD  fluticasone [facility]) 50 MCG/ACT nasal spray Place 2 sprays into both nostrils daily. 12/29/21   Haydee Salter, MD  GAVILAX 17 GM/SCOOP powder Place 1 capful (17 g) into feeding tube daily as needed for mild constipation or moderate constipation. Patient taking differently: Place 17 g into feeding tube 2 (two) times daily. 12/29/21   Haydee Salter, MD  Glucose 4-6 GM-MG CHEW Give 16 g by tube See admin instructions. Every 15 mins 07/11/22   [provider]  glycopyrrolate (ROBINUL) 1 MG tablet Place 1 tablet (1 mg total) into feeding tube 3 (three) times daily. Patient taking differently: Place 1 mg into feeding tube 3 (three) times daily. 0800/1400/2000 11/14/21 08/09/22  Haydee Salter, MD  hydrALAZINE (APRESOLINE) 50 MG tablet PLACE 1 TABLET (50 MG TOTAL) INTO FEEDING TUBE 3 (THREE) TIMES DAILY. 06/20/22 12/17/22  Haydee Salter, MD  hydrocortisone (ANUSOL-HC) 2.5 % rectal cream Apply 1 Application topically 4 (four) times daily as needed for hemorrhoids. Patient not taking: Reported on 08/09/2022 06/05/22   Enedina Finner, MD  ibuprofen (ADVIL) 200 MG tablet Take 200 mg by mouth every 6 (six) hours as needed for fever, headache, mild pain, moderate pain or cramping.    [provider]  ipratropium-albuterol (DUONEB) 0.5-2.5 (3) MG/3ML  SOLN TAKE 3 ML BY NEBULIZATION EVERY 4 HOUR AS NEED (WHEEZING, SHORTNESS OF BREATH). USE FIRST BEFORE ALBUTEROL Patient not taking: Reported on 08/09/2022 05/09/22   Haydee Salter, MD  isosorbide dinitrate (ISORDIL) 30 MG tablet Place 1 tablet (30 mg total) into feeding tube 3 (three) times daily. 05/09/22   Haydee Salter, MD  LANTUS SOLOSTAR 100 UNIT/ML Solostar Pen Inject 15 Units into the skin at bedtime. 07/11/22   [provider]  levETIRAcetam (KEPPRA) 100 MG/ML solution Place 7.5 mLs (750 mg total) into feeding tube 2 (two) times daily. 12/27/22   Merwyn Katos, MD  Loperamide HCl 1 MG/7.5ML LIQD Place 15 mLs (2 mg total) into feeding tube daily as needed for diarrhea or loose stools. Patient not taking: Reported on 08/09/2022 11/14/21   Haydee Salter, MD  metoCLOPramide [facility]) 5 MG/5ML solution Take 10 mg by mouth every 6 (six) hours. 0200/0800/1400/2000    [provider]  Mouthwashes (MOUTH RINSE) LIQD solution 15 mLs by Mouth Rinse route every 4 (four) hours. Patient  not taking: Reported on 08/09/2022 03/30/21   Judithe Modest, NP  Nebulizer System All-In-One MISC 1 Inhalation by Does not apply route every hour as needed. 11/11/21   Haydee Salter, MD  Nutritional Supplements (FEEDING SUPPLEMENT, OSMOLITE 1.5 CAL,) LIQD Place 1,000 mLs into feeding tube continuous. 10/04/21   Burnadette Pop, MD  ondansetron (ZOFRAN) 4 MG tablet Take 4 mg by mouth every 6 (six) hours as needed for nausea or vomiting.    [provider]  scopolamine (TRANSDERM-SCOP) 1 MG/3DAYS PLACE 1 PATCH ONTO THE SKIN EVERY 3 DAYS. 05/09/22   Haydee Salter, MD  sodium chloride HYPERTONIC 3 % nebulizer solution Take 3 mLs by nebulization every 4 (four) hours as needed for other or cough.    [provider]  spironolactone (ALDACTONE) 25 MG tablet Place 1 tablet (25 mg total) into feeding tube daily. 05/09/22 08/09/22  Haydee Salter, MD  trimethoprim-polymyxin b (POLYTRIM) ophthalmic  solution Place 1 drop into both eyes See admin instructions. Every 3 hours for 7-10 days 06/14/22   [provider]  valproic acid (DEPAKENE) 250 MG/5ML solution Place 15 mLs (750 mg total) into feeding tube 3 (three) times daily. 05/09/22 11/05/22  Haydee Salter, MD  Water For Irrigation, Sterile (FREE WATER) SOLN Place 200 mLs into feeding tube every 6 (six) hours. 08/07/21   Joycelyn Das, MD    Physical Exam: Vitals:   04/18/23 2130 04/18/23 2200 04/18/23 2230 04/18/23 2300  BP: (!) 139/102 (!) 131/99 102/76 101/76  Pulse: 96 95 97 90  Resp:    19  Temp:    98.5 F (36.9 C)  TempSrc:    Oral  SpO2: 98% 98% 96% 91%  Weight:      Height:       General: Not in acute distress HEENT:       Eyes: PERRL, EOMI, no jaundice       ENT: No discharge from the ears and nose, no pharynx injection, no tonsillar enlargement.        Neck: No JVD, no bruit, no mass felt. Heme: No neck lymph node enlargement. Cardiac: S1/S2, RRR, No murmurs, No gallops or rubs. Respiratory: Has fine crackles bilaterally  GI: Soft, nondistended, nontender,, no organomegaly, BS present. GU: No hematuria Ext: Has trace leg edema bilaterally. 1+DP/PT pulse bilaterally. Musculoskeletal: No joint deformities, No joint redness or warmth, no limitation of ROM in spin. Skin: No rashes.  Neuro: Patient is nonverbal, nerves II-XII grossly intact, slightly moves extremities on painful stimuli, Psych: Could not be reviewed  Labs on Admission: I have personally reviewed following labs and imaging studies  CBC: Recent Labs  Lab 04/18/23 1734  WBC 17.0*  NEUTROABS 13.2*  HGB 9.8*  HCT 32.4*  MCV 110.6*  PLT 179   Basic Metabolic Panel: Recent Labs  Lab 04/18/23 1734  NA 142  K 5.8*  CL 101  CO2 32  GLUCOSE 192*  BUN 62*  CREATININE 1.26*  CALCIUM 9.1   GFR: Estimated Creatinine Clearance: 90.1 mL/min (A) (by C-G formula based on SCr of 1.26 mg/dL (H)). Liver Function Tests: No results for  input(s): "AST", "ALT", "ALKPHOS", "BILITOT", "PROT", "ALBUMIN" in the last 168 hours. No results for input(s): "LIPASE", "AMYLASE" in the last 168 hours. No results for input(s): "AMMONIA" in the last 168 hours. Coagulation Profile: No results for input(s): "INR", "PROTIME" in the last 168 hours. Cardiac Enzymes: No results for input(s): "CKTOTAL", "CKMB", "CKMBINDEX", "TROPONINI" in the last 168 hours. BNP (  last 3 results) No results for input(s): "PROBNP" in the last 8760 hours. HbA1C: No results for input(s): "HGBA1C" in the last 72 hours. CBG: Recent Labs  Lab 04/18/23 2154  GLUCAP 147*   Lipid Profile: No results for input(s): "CHOL", "HDL", "LDLCALC", "TRIG", "CHOLHDL", "LDLDIRECT" in the last 72 hours. Thyroid Function Tests: No results for input(s): "TSH", "T4TOTAL", "FREET4", "T3FREE", "THYROIDAB" in the last 72 hours. Anemia Panel: No results for input(s): "VITAMINB12", "FOLATE", "FERRITIN", "TIBC", "IRON", "RETICCTPCT" in the last 72 hours. Urine analysis:    Component Value Date/Time   COLORURINE YELLOW (A) 01/05/2022 2236   APPEARANCEUR CLEAR (A) 01/05/2022 2236   LABSPEC 1.018 01/05/2022 2236   PHURINE 8.0 01/05/2022 2236   GLUCOSEU NEGATIVE 01/05/2022 2236   HGBUR NEGATIVE 01/05/2022 2236   BILIRUBINUR NEGATIVE 01/05/2022 2236   KETONESUR NEGATIVE 01/05/2022 2236   PROTEINUR NEGATIVE 01/05/2022 2236   NITRITE NEGATIVE 01/05/2022 2236   LEUKOCYTESUR NEGATIVE 01/05/2022 2236   Sepsis Labs: @LABRCNTIP (procalcitonin:4,lacticidven:4) ) Recent Results (from the past 240 hours)  Resp panel by RT-PCR (RSV, Flu A&B, Covid) Anterior Nasal Swab     Status: None   Collection Time: 04/18/23  3:44 PM   Specimen: Anterior Nasal Swab  Result Value Ref Range Status   SARS Coronavirus 2 by RT PCR NEGATIVE NEGATIVE Final    Comment: (NOTE) SARS-CoV-2 target nucleic acids are NOT DETECTED.  The SARS-CoV-2 RNA is generally detectable in upper respiratory specimens during  the acute phase of infection. The lowest concentration of SARS-CoV-2 viral copies this assay can detect is 138 copies/mL. A negative result does not preclude SARS-Cov-2 infection and should not be used as the sole basis for treatment or other patient management decisions. A negative result may occur with  improper specimen collection/handling, submission of specimen other than nasopharyngeal swab, presence of viral mutation(s) within the areas targeted by this assay, and inadequate number of viral copies(<138 copies/mL). A negative result must be combined with clinical observations, patient history, and epidemiological information. The expected result is Negative.  Fact Sheet for Patients:  BloggerCourse.com  Fact Sheet for Healthcare Providers:  SeriousBroker.it  This test is no t yet approved or cleared by the [place] FDA and  has been authorized for detection and/or diagnosis of SARS-CoV-2 by FDA under an Emergency Use Authorization (EUA). This EUA will remain  in effect (meaning this test can be used) for the duration of the COVID-19 declaration under Section 564(b)(1) of the Act, 21 U.S.C.section 360bbb-3(b)(1), unless the authorization is terminated  or revoked sooner.       Influenza A by PCR NEGATIVE NEGATIVE Final   Influenza B by PCR NEGATIVE NEGATIVE Final    Comment: (NOTE) The Xpert Xpress SARS-CoV-2/FLU/RSV plus assay is intended as an aid in the diagnosis of influenza from Nasopharyngeal swab specimens and should not be used as a sole basis for treatment. Nasal washings and aspirates are unacceptable for Xpert Xpress SARS-CoV-2/FLU/RSV testing.  Fact Sheet for Patients: BloggerCourse.com  Fact Sheet for Healthcare Providers: SeriousBroker.it  This test is not yet approved or cleared by the [place] FDA and has been authorized for detection and/or  diagnosis of SARS-CoV-2 by FDA under an Emergency Use Authorization (EUA). This EUA will remain in effect (meaning this test can be used) for the duration of the COVID-19 declaration under Section 564(b)(1) of the Act, 21 U.S.C. section 360bbb-3(b)(1), unless the authorization is terminated or revoked.     Resp Syncytial Virus by PCR NEGATIVE NEGATIVE Final  Comment: (NOTE) Fact Sheet for Patients: BloggerCourse.com  Fact Sheet for Healthcare Providers: SeriousBroker.it  This test is not yet approved or cleared by the [place] FDA and has been authorized for detection and/or diagnosis of SARS-CoV-2 by FDA under an Emergency Use Authorization (EUA). This EUA will remain in effect (meaning this test can be used) for the duration of the COVID-19 declaration under Section 564(b)(1) of the Act, 21 U.S.C. section 360bbb-3(b)(1), unless the authorization is terminated or revoked.  Performed at [facility], [address] [place], [place] [address]   Expectorated Sputum Assessment w Gram Stain, Rflx to Resp Cult     Status: None   Collection Time: 04/18/23  8:32 PM   Specimen: Sputum  Result Value Ref Range Status   Specimen Description SPUTUM  Final   Special Requests NONE  Final   Sputum evaluation   Final    THIS SPECIMEN IS ACCEPTABLE FOR SPUTUM CULTURE Performed at [facility], [address], [place], [place] [address]    Report Status 04/18/2023 FINAL  Final     Radiological Exams on Admission:   Assessment/Plan Principal Problem:   Acute on chronic systolic CHF (congestive heart failure) ([name]) Active Problems:   HCAP (healthcare-associated pneumonia)   Essential hypertension   Seizure disorder ([name])   Hyperkalemia   DVT (deep venous thrombosis) ([name])   Pulmonary embolus ([name])   Tracheostomy dependent ([name])   S/P percutaneous endoscopic gastrostomy (PEG) tube placement  ([name])   Assessment and Plan:  Acute on chronic systolic CHF (congestive heart failure) ([name]): 2D echo on 09/17/2021 showed EF of 30 to 35%.  Patient has SOB, increased oxygen requirement, elevated BNP 349, leg edema, clinically consistent with CHF exacerbation.   -Will admit to CPU as inpatient -Lasix 40 mg bid by IV -Daily weights -strict I/O's -Low salt diet -Fluid restriction -As needed bronchodilators for shortness of breath   Possible HCAP (healthcare-associated pneumonia): Patient has leukocytosis with WBC 17.1, temperature 99.1.  Chest x-ray showed bilateral basilar infiltration, indicating possible HCAP, cannot completely rule out possibility of aspiration pneumonia.       Essential hypertension   Seizure disorder ([name])   Hyperkalemia   DVT (deep venous thrombosis) ([name])   Pulmonary embolus ([name])   Tracheostomy dependent ([name])   S/P percutaneous endoscopic gastrostomy (PEG) tube placement ([name])       DVT ppx: on Eliquis  Code Status: Full code per his wife  Family Communication:   Yes, patient's  wife at bed side.      Disposition Plan:  Anticipate discharge back to previous environment  Consults called:  none  Admission status and Level of care: Progressive:  as inpt        Dispo: The patient is from: Home              Anticipated d/c is to: Home              Anticipated d/c date is: 2 days              Patient currently is not medically stable to d/c.    Severity of Illness:  The appropriate patient status for this patient is INPATIENT. Inpatient status is judged to be reasonable and necessary in order to provide the required intensity of service to ensure the patient's safety. The patient's presenting symptoms, physical exam findings, and initial radiographic and laboratory data in the context of their chronic comorbidities is felt to place them at high risk for  further clinical deterioration. Furthermore, it is not anticipated that the patient will  be medically stable for discharge from the hospital within 2 midnights of admission.   * I certify that at the point of admission it is my clinical judgment that the patient will require inpatient hospital care spanning beyond 2 midnights from the point of admission due to high intensity of service, high risk for further deterioration and high frequency of surveillance required.*       Date of Service 04/19/2023    Lorretta Harp [facility]   If 7PM-7AM, please contact night-coverage [email or web address] 04/19/2023, 12:01 AM

## 2023-04-18 NOTE — ED Notes (Signed)
ED Provider at bedside.

## 2023-04-18 NOTE — H&P (Signed)
History and Physical    Billy Cherry WJX:[id number] DOB: [birth year] DOA: 04/18/2023  Referring MD/NP/PA:   PCP: Haydee Salter, MD   Patient coming from:  The patient is coming from home.     Chief Complaint: SOB  HPI: [name] is a 53 y.o. male with medical history significant of      Data reviewed independently and ED Course: pt was found to have     ***       EKG: I have personally reviewed.  Not done in ED, will get one.   ***   Review of Systems:   General: no fevers, chills, no body weight gain, has poor appetite, has fatigue HEENT: no blurry vision, hearing changes or sore throat Respiratory: no dyspnea, coughing, wheezing CV: no chest pain, no palpitations GI: no nausea, vomiting, abdominal pain, diarrhea, constipation GU: no dysuria, burning on urination, increased urinary frequency, hematuria  Ext: no leg edema Neuro: no unilateral weakness, numbness, or tingling, no vision change or hearing loss Skin: no rash, no skin tear. MSK: No muscle spasm, no deformity, no limitation of range of movement in spin Heme: No easy bruising.  Travel history: No recent long distant travel.   Allergy:  Allergies  Allergen Reactions   Zestril [Lisinopril] Swelling    angioedema    Past Medical History:  Diagnosis Date   Anoxic brain injury ([name])    Hypertension     Past Surgical History:  Procedure Laterality Date   BACK SURGERY     CORONARY/GRAFT ACUTE MI REVASCULARIZATION N/A 03/23/2021   Procedure: Coronary/Graft Acute MI Revascularization;  Surgeon: Marcina Millard, MD;  Location: [facility] INVASIVE CV [facility];  Service: Cardiovascular;  Laterality: N/A;   IR GASTROSTOMY TUBE MOD SED  04/26/2021   LEFT HEART CATH AND CORONARY ANGIOGRAPHY N/A 03/23/2021   Procedure: LEFT HEART CATH AND CORONARY ANGIOGRAPHY;  Surgeon: Marcina Millard, MD;  Location: [facility] INVASIVE CV LAB;  Service: Cardiovascular;  Laterality: N/A;   SHOULDER SURGERY      Social  History:  reports that he has never smoked. He has never used smokeless tobacco. He reports current alcohol use. He reports that he does not use drugs.  Family History: No family history on file.   Prior to Admission medications   Medication Sig Start Date End Date Taking? Authorizing Provider  acetaminophen (TYLENOL) 325 MG tablet Take 650 mg by mouth every 6 (six) hours as needed for headache, fever, moderate pain or mild pain.    [provider]  albuterol (PROVENTIL) (2.5 MG/3ML) 0.083% nebulizer solution Take 3 mLs (2.5 mg total) by nebulization every 6 (six) hours as needed for wheezing or shortness of breath. Patient taking differently: Take 1 mL by nebulization every 2 (two) hours as needed for wheezing or shortness of breath. 11/11/21   Haydee Salter, MD  atropine 1 % ophthalmic solution Place 2 drops under the tongue 4 (four) times daily. Patient taking differently: Place 2 drops under the tongue 4 (four) times daily. 0800/1400/2000/0200 11/07/21   Haydee Salter, MD  carvedilol (COREG) 6.25 MG tablet Place 1 tablet (6.25 mg total) into feeding tube 2 (two) times daily with a meal. 05/09/22 08/09/22  Haydee Salter, MD  chlorhexidine gluconate, MEDLINE KIT, (PERIDEX) 0.12 % solution 15 mLs by Mouth Rinse route 2 (two) times daily. 03/30/21   Judithe Modest, NP  clonazePAM (KLONOPIN) 0.5 MG tablet Place 1 tablet (0.5 mg total) into feeding tube 2 (two) times daily as needed for  anxiety. Patient taking differently: Place 0.5 mg into feeding tube 2 (two) times daily. 05/29/22 08/09/22  Haydee Salter, MD  doxazosin (CARDURA) 2 MG tablet Place 1 tablet (2 mg total) into feeding tube at bedtime. 06/20/22 12/17/22  Haydee Salter, MD  ELIQUIS 5 MG TABS tablet PLACE 1 TABLET (5 MG TOTAL) INTO FEEDING TUBE 2 (TWO) TIMES DAILY. 06/20/22   Haydee Salter, MD  famotidine (PEPCID) 20 MG tablet PLACE 1 TABLET (20 MG TOTAL) INTO FEEDING TUBE 2 (TWO) TIMES DAILY. 03/20/22 09/16/22  Haydee Salter, MD  fiber (NUTRISOURCE FIBER) PACK packet Place 1 packet into feeding tube 2 (two) times daily. Patient not taking: Reported on 08/09/2022 10/19/21   Lewie Chamber, MD  fluticasone [facility]) 50 MCG/ACT nasal spray Place 2 sprays into both nostrils daily. 12/29/21   Haydee Salter, MD  GAVILAX 17 GM/SCOOP powder Place 1 capful (17 g) into feeding tube daily as needed for mild constipation or moderate constipation. Patient taking differently: Place 17 g into feeding tube 2 (two) times daily. 12/29/21   Haydee Salter, MD  Glucose 4-6 GM-MG CHEW Give 16 g by tube See admin instructions. Every 15 mins 07/11/22   [provider]  glycopyrrolate (ROBINUL) 1 MG tablet Place 1 tablet (1 mg total) into feeding tube 3 (three) times daily. Patient taking differently: Place 1 mg into feeding tube 3 (three) times daily. 0800/1400/2000 11/14/21 08/09/22  Haydee Salter, MD  hydrALAZINE (APRESOLINE) 50 MG tablet PLACE 1 TABLET (50 MG TOTAL) INTO FEEDING TUBE 3 (THREE) TIMES DAILY. 06/20/22 12/17/22  Haydee Salter, MD  hydrocortisone (ANUSOL-HC) 2.5 % rectal cream Apply 1 Application topically 4 (four) times daily as needed for hemorrhoids. Patient not taking: Reported on 08/09/2022 06/05/22   Enedina Finner, MD  ibuprofen (ADVIL) 200 MG tablet Take 200 mg by mouth every 6 (six) hours as needed for fever, headache, mild pain, moderate pain or cramping.    [provider]  ipratropium-albuterol (DUONEB) 0.5-2.5 (3) MG/3ML SOLN TAKE 3 ML BY NEBULIZATION EVERY 4 HOUR AS NEED (WHEEZING, SHORTNESS OF BREATH). USE FIRST BEFORE ALBUTEROL Patient not taking: Reported on 08/09/2022 05/09/22   Haydee Salter, MD  isosorbide dinitrate (ISORDIL) 30 MG tablet Place 1 tablet (30 mg total) into feeding tube 3 (three) times daily. 05/09/22   Haydee Salter, MD  LANTUS SOLOSTAR 100 UNIT/ML Solostar Pen Inject 15 Units into the skin at bedtime. 07/11/22   [provider]  levETIRAcetam (KEPPRA) 100 MG/ML  solution Place 7.5 mLs (750 mg total) into feeding tube 2 (two) times daily. 12/27/22   Merwyn Katos, MD  Loperamide HCl 1 MG/7.5ML LIQD Place 15 mLs (2 mg total) into feeding tube daily as needed for diarrhea or loose stools. Patient not taking: Reported on 08/09/2022 11/14/21   Haydee Salter, MD  metoCLOPramide [facility]) 5 MG/5ML solution Take 10 mg by mouth every 6 (six) hours. 0200/0800/1400/2000    [provider]  Mouthwashes (MOUTH RINSE) LIQD solution 15 mLs by Mouth Rinse route every 4 (four) hours. Patient not taking: Reported on 08/09/2022 03/30/21   Judithe Modest, NP  Nebulizer System All-In-One MISC 1 Inhalation by Does not apply route every hour as needed. 11/11/21   Haydee Salter, MD  Nutritional Supplements (FEEDING SUPPLEMENT, OSMOLITE 1.5 CAL,) LIQD Place 1,000 mLs into feeding tube continuous. 10/04/21   Burnadette Pop, MD  ondansetron (ZOFRAN) 4 MG tablet Take 4 mg by mouth every 6 (six) hours  as needed for nausea or vomiting.    [provider]  scopolamine (TRANSDERM-SCOP) 1 MG/3DAYS PLACE 1 PATCH ONTO THE SKIN EVERY 3 DAYS. 05/09/22   Haydee Salter, MD  sodium chloride HYPERTONIC 3 % nebulizer solution Take 3 mLs by nebulization every 4 (four) hours as needed for other or cough.    [provider]  spironolactone (ALDACTONE) 25 MG tablet Place 1 tablet (25 mg total) into feeding tube daily. 05/09/22 08/09/22  Haydee Salter, MD  trimethoprim-polymyxin b (POLYTRIM) ophthalmic solution Place 1 drop into both eyes See admin instructions. Every 3 hours for 7-10 days 06/14/22   [provider]  valproic acid (DEPAKENE) 250 MG/5ML solution Place 15 mLs (750 mg total) into feeding tube 3 (three) times daily. 05/09/22 11/05/22  Haydee Salter, MD  Water For Irrigation, Sterile (FREE WATER) SOLN Place 200 mLs into feeding tube every 6 (six) hours. 08/07/21   Joycelyn Das, MD    Physical Exam: Vitals:   04/18/23 1600 04/18/23 1630 04/18/23 1730  04/18/23 1919  BP: (!) 139/100 (!) 127/99 (!) 130/95 (!) 125/100  Pulse: (!) 108 (!) 103 (!) 101 96  Resp:   (!) 22   Temp:    99.1 F (37.3 C)  TempSrc:    Oral  SpO2: 97% 95% 96% 99%  Weight:      Height:       General: Not in acute distress HEENT:       Eyes: PERRL, EOMI, no jaundice       ENT: No discharge from the ears and nose, no pharynx injection, no tonsillar enlargement.        Neck: No JVD, no bruit, no mass felt. Heme: No neck lymph node enlargement. Cardiac: S1/S2, RRR, No murmurs, No gallops or rubs. Respiratory: No rales, wheezing, rhonchi or rubs. GI: Soft, nondistended, nontender, no rebound pain, no organomegaly, BS present. GU: No hematuria Ext: No pitting leg edema bilaterally. 1+DP/PT pulse bilaterally. Musculoskeletal: No joint deformities, No joint redness or warmth, no limitation of ROM in spin. Skin: No rashes.  Neuro: Alert, oriented X3, cranial nerves II-XII grossly intact, moves all extremities normally. Muscle strength 5/5 in all extremities, sensation to light touch intact. Brachial reflex 2+ bilaterally. Knee reflex 1+ bilaterally. Negative Babinski's sign. Normal finger to nose test. Psych: Patient is not psychotic, no suicidal or hemocidal ideation.  Labs on Admission: I have personally reviewed following labs and imaging studies  CBC: Recent Labs  Lab 04/18/23 1734  WBC 17.0*  NEUTROABS 13.2*  HGB 9.8*  HCT 32.4*  MCV 110.6*  PLT 179   Basic Metabolic Panel: Recent Labs  Lab 04/18/23 1734  NA 142  K 5.8*  CL 101  CO2 32  GLUCOSE 192*  BUN 62*  CREATININE 1.26*  CALCIUM 9.1   GFR: Estimated Creatinine Clearance: 90.1 mL/min (A) (by C-G formula based on SCr of 1.26 mg/dL (H)). Liver Function Tests: No results for input(s): "AST", "ALT", "ALKPHOS", "BILITOT", "PROT", "ALBUMIN" in the last 168 hours. No results for input(s): "LIPASE", "AMYLASE" in the last 168 hours. No results for input(s): "AMMONIA" in the last 168  hours. Coagulation Profile: No results for input(s): "INR", "PROTIME" in the last 168 hours. Cardiac Enzymes: No results for input(s): "CKTOTAL", "CKMB", "CKMBINDEX", "TROPONINI" in the last 168 hours. BNP (last 3 results) No results for input(s): "PROBNP" in the last 8760 hours. HbA1C: No results for input(s): "HGBA1C" in the last 72 hours. CBG: No results for input(s): "GLUCAP" in  the last 168 hours. Lipid Profile: No results for input(s): "CHOL", "HDL", "LDLCALC", "TRIG", "CHOLHDL", "LDLDIRECT" in the last 72 hours. Thyroid Function Tests: No results for input(s): "TSH", "T4TOTAL", "FREET4", "T3FREE", "THYROIDAB" in the last 72 hours. Anemia Panel: No results for input(s): "VITAMINB12", "FOLATE", "FERRITIN", "TIBC", "IRON", "RETICCTPCT" in the last 72 hours. Urine analysis:    Component Value Date/Time   COLORURINE YELLOW (A) 01/05/2022 2236   APPEARANCEUR CLEAR (A) 01/05/2022 2236   LABSPEC 1.018 01/05/2022 2236   PHURINE 8.0 01/05/2022 2236   GLUCOSEU NEGATIVE 01/05/2022 2236   HGBUR NEGATIVE 01/05/2022 2236   BILIRUBINUR NEGATIVE 01/05/2022 2236   KETONESUR NEGATIVE 01/05/2022 2236   PROTEINUR NEGATIVE 01/05/2022 2236   NITRITE NEGATIVE 01/05/2022 2236   LEUKOCYTESUR NEGATIVE 01/05/2022 2236   Sepsis Labs: @LABRCNTIP (procalcitonin:4,lacticidven:4) ) Recent Results (from the past 240 hours)  Resp panel by RT-PCR (RSV, Flu A&B, Covid) Anterior Nasal Swab     Status: None   Collection Time: 04/18/23  3:44 PM   Specimen: Anterior Nasal Swab  Result Value Ref Range Status   SARS Coronavirus 2 by RT PCR NEGATIVE NEGATIVE Final    Comment: (NOTE) SARS-CoV-2 target nucleic acids are NOT DETECTED.  The SARS-CoV-2 RNA is generally detectable in upper respiratory specimens during the acute phase of infection. The lowest concentration of SARS-CoV-2 viral copies this assay can detect is 138 copies/mL. A negative result does not preclude SARS-Cov-2 infection and should not be  used as the sole basis for treatment or other patient management decisions. A negative result may occur with  improper specimen collection/handling, submission of specimen other than nasopharyngeal swab, presence of viral mutation(s) within the areas targeted by this assay, and inadequate number of viral copies(<138 copies/mL). A negative result must be combined with clinical observations, patient history, and epidemiological information. The expected result is Negative.  Fact Sheet for Patients:  BloggerCourse.com  Fact Sheet for Healthcare Providers:  SeriousBroker.it  This test is no t yet approved or cleared by the [place] FDA and  has been authorized for detection and/or diagnosis of SARS-CoV-2 by FDA under an Emergency Use Authorization (EUA). This EUA will remain  in effect (meaning this test can be used) for the duration of the COVID-19 declaration under Section 564(b)(1) of the Act, 21 U.S.C.section 360bbb-3(b)(1), unless the authorization is terminated  or revoked sooner.       Influenza A by PCR NEGATIVE NEGATIVE Final   Influenza B by PCR NEGATIVE NEGATIVE Final    Comment: (NOTE) The Xpert Xpress SARS-CoV-2/FLU/RSV plus assay is intended as an aid in the diagnosis of influenza from Nasopharyngeal swab specimens and should not be used as a sole basis for treatment. Nasal washings and aspirates are unacceptable for Xpert Xpress SARS-CoV-2/FLU/RSV testing.  Fact Sheet for Patients: BloggerCourse.com  Fact Sheet for Healthcare Providers: SeriousBroker.it  This test is not yet approved or cleared by the [place] FDA and has been authorized for detection and/or diagnosis of SARS-CoV-2 by FDA under an Emergency Use Authorization (EUA). This EUA will remain in effect (meaning this test can be used) for the duration of the COVID-19 declaration under Section  564(b)(1) of the Act, 21 U.S.C. section 360bbb-3(b)(1), unless the authorization is terminated or revoked.     Resp Syncytial Virus by PCR NEGATIVE NEGATIVE Final    Comment: (NOTE) Fact Sheet for Patients: BloggerCourse.com  Fact Sheet for Healthcare Providers: SeriousBroker.it  This test is not yet approved or cleared by the [place] and has been authorized  for detection and/or diagnosis of SARS-CoV-2 by FDA under an Emergency Use Authorization (EUA). This EUA will remain in effect (meaning this test can be used) for the duration of the COVID-19 declaration under Section 564(b)(1) of the Act, 21 U.S.C. section 360bbb-3(b)(1), unless the authorization is terminated or revoked.  Performed at [facility], [address], [place], [place] [address]      Radiological Exams on Admission:   Assessment/Plan Principal Problem:   Acute on chronic systolic CHF (congestive heart failure) ([name]) Active Problems:   Essential hypertension   Seizure disorder ([name])   Hyperkalemia   DVT (deep venous thrombosis) ([name])   Pulmonary embolus ([name])   Tracheostomy dependent ([name])   S/P percutaneous endoscopic gastrostomy (PEG) tube placement ([name])   Assessment and Plan: No notes have been filed under this hospital service. Service: Hospitalist      Principal Problem:   Acute on chronic systolic CHF (congestive heart failure) ([name]) Active Problems:   Essential hypertension   Seizure disorder ([name])   Hyperkalemia   DVT (deep venous thrombosis) ([name])   Pulmonary embolus ([name])   Tracheostomy dependent ([name])   S/P percutaneous endoscopic gastrostomy (PEG) tube placement ([name])    DVT ppx: SQ Heparin         SQ Lovenox  Code Status: Full code   ***  Family Communication:     not done, no family member is at bed side.              Yes, patient's    at bed side.       by phone   ***  Disposition Plan:  Anticipate  discharge back to previous environment  Consults called:    Admission status and Level of care: Progressive:    for obs as inpt        Dispo: The patient is from: {From:23814}              Anticipated d/c is to: {To:23815}              Anticipated d/c date is: {Days:23816}              Patient currently {Medically stable:23817}    Severity of Illness:  {Observation/Inpatient:21159}       Date of Service 04/18/2023    Lorretta Harp [facility]   If 7PM-7AM, please contact night-coverage [email or web address] 04/18/2023, 8:05 PM

## 2023-04-18 NOTE — ED Notes (Signed)
Patient extremities swollen. Unable to obtain IV access/blood draw. IV team order placed. MD Fuller Plan notified of the delay and ok due to patient stable.

## 2023-04-18 NOTE — ED Triage Notes (Signed)
Pt BIB EMS for hypoxia reported by home health. EMS stated O2 99% with them. Pt chronic trach. NAD at triage.

## 2023-04-18 NOTE — ED Notes (Signed)
Fall risk arm band placed.

## 2023-04-18 NOTE — Consult Note (Signed)
CODE SEPSIS - PHARMACY COMMUNICATION  **Broad Spectrum Antibiotics should be administered within 1 hour of Sepsis diagnosis**  Time Code Sepsis Called/Page Received: 1843  Antibiotics Ordered: cefepime and vancomycin  Time of 1st antibiotic administration: 1916  Additional action taken by pharmacy: none  If necessary, Name of Provider/Nurse Contacted: n/a   Lalo Tromp Rodriguez-Guzman PharmD, BCPS 04/18/2023 7:20 PM

## 2023-04-18 NOTE — Progress Notes (Signed)
Elink monitoring for the code sepsis protocol.

## 2023-04-19 DIAGNOSIS — I5023 Acute on chronic systolic (congestive) heart failure: Secondary | ICD-10-CM | POA: Diagnosis not present

## 2023-04-19 DIAGNOSIS — G931 Anoxic brain damage, not elsewhere classified: Secondary | ICD-10-CM

## 2023-04-19 DIAGNOSIS — J9621 Acute and chronic respiratory failure with hypoxia: Secondary | ICD-10-CM

## 2023-04-19 DIAGNOSIS — N182 Chronic kidney disease, stage 2 (mild): Secondary | ICD-10-CM

## 2023-04-19 DIAGNOSIS — J189 Pneumonia, unspecified organism: Secondary | ICD-10-CM | POA: Diagnosis not present

## 2023-04-19 DIAGNOSIS — E875 Hyperkalemia: Secondary | ICD-10-CM | POA: Diagnosis not present

## 2023-04-19 DIAGNOSIS — D649 Anemia, unspecified: Secondary | ICD-10-CM

## 2023-04-19 LAB — BASIC METABOLIC PANEL
Anion gap: 10 (ref 5–15)
BUN: 62 mg/dL — ABNORMAL HIGH (ref 6–20)
CO2: 32 mmol/L (ref 22–32)
Calcium: 9.1 mg/dL (ref 8.9–10.3)
Chloride: 100 mmol/L (ref 98–111)
Creatinine, Ser: 1.38 mg/dL — ABNORMAL HIGH (ref 0.61–1.24)
GFR, Estimated: >60 mL/min (ref >60)
Glucose, Bld: 151 mg/dL — ABNORMAL HIGH (ref 70–99)
Potassium: 5.3 mmol/L — ABNORMAL HIGH (ref 3.5–5.1)
Sodium: 142 mmol/L (ref 135–145)

## 2023-04-19 LAB — CBG MONITORING, ED
Glucose-Capillary: 165 mg/dL — ABNORMAL HIGH (ref 70–99)
Glucose-Capillary: 129 mg/dL — ABNORMAL HIGH (ref 70–99)
Glucose-Capillary: 147 mg/dL — ABNORMAL HIGH (ref 70–99)
Glucose-Capillary: 156 mg/dL — ABNORMAL HIGH (ref 70–99)

## 2023-04-19 LAB — MRSA NEXT GEN BY PCR, NASAL: MRSA by PCR Next Gen: NOT DETECTED

## 2023-04-19 LAB — CBC
HCT: 30.6 % — ABNORMAL LOW (ref 39.0–52.0)
Hemoglobin: 9.1 g/dL — ABNORMAL LOW (ref 13.0–17.0)
MCH: 33.3 pg (ref 26.0–34.0)
MCHC: 29.7 g/dL — ABNORMAL LOW (ref 30.0–36.0)
MCV: 112.1 fL — ABNORMAL HIGH (ref 80.0–100.0)
Platelets: 152 10*3/uL (ref 150–400)
RBC: 2.73 MIL/uL — ABNORMAL LOW (ref 4.22–5.81)
RDW: 15.4 % (ref 11.5–15.5)
WBC: 11.4 10*3/uL — ABNORMAL HIGH (ref 4.0–10.5)
nRBC: 0 % (ref 0.0–0.2)

## 2023-04-19 LAB — GLUCOSE, CAPILLARY
Glucose-Capillary: 152 mg/dL — ABNORMAL HIGH (ref 70–99)
Glucose-Capillary: 171 mg/dL — ABNORMAL HIGH (ref 70–99)

## 2023-04-19 LAB — STREP PNEUMONIAE URINARY ANTIGEN: Strep Pneumo Urinary Antigen: NEGATIVE

## 2023-04-19 LAB — HIV ANTIBODY (ROUTINE TESTING W REFLEX): HIV Screen 4th Generation wRfx: NONREACTIVE

## 2023-04-19 MED ORDER — SODIUM CHLORIDE 0.9 % IV SOLN
2.0000 g | Freq: Three times a day (TID) | INTRAVENOUS | Status: DC
  Administered 2023-04-19 – 2023-04-22 (×11): 2 g via INTRAVENOUS
  Filled 2023-04-19 (×12): qty 12.5

## 2023-04-19 MED ORDER — METRONIDAZOLE 500 MG/100ML IV SOLN
500.0000 mg | Freq: Two times a day (BID) | INTRAVENOUS | Status: DC
  Administered 2023-04-19 – 2023-04-21 (×7): 500 mg via INTRAVENOUS
  Filled 2023-04-19 (×8): qty 100

## 2023-04-19 MED ORDER — FUROSEMIDE 10 MG/ML IJ SOLN
40.0000 mg | Freq: Two times a day (BID) | INTRAMUSCULAR | Status: DC
  Administered 2023-04-19 – 2023-04-20 (×3): 40 mg via INTRAVENOUS
  Filled 2023-04-19 (×3): qty 4

## 2023-04-19 MED ORDER — VANCOMYCIN HCL IN DEXTROSE 1-5 GM/200ML-% IV SOLN: 1000.0000 mg | Freq: Two times a day (BID) | INTRAVENOUS | Status: DC

## 2023-04-19 MED ORDER — VANCOMYCIN HCL IN DEXTROSE 1-5 GM/200ML-% IV SOLN
1000.0000 mg | Freq: Once | INTRAVENOUS | Status: AC
Start: 2023-04-19 — End: 2023-04-19
  Administered 2023-04-19: 1000 mg via INTRAVENOUS
  Filled 2023-04-19: qty 200

## 2023-04-19 NOTE — Assessment & Plan Note (Addendum)
Patient initially placed on vancomycin Maxipime and Flagyl.  Since MRSA PCR is negative, vancomycin was discontinued.

## 2023-04-19 NOTE — ED Notes (Signed)
Wife of the pt is very upset with this RN with the care that has been provided with him.  Attempted to suction pt, and she stated that I was not suctioning him well.  She is upset that this RN hasn't been caring for the pt according to her standards.  Pt had a small amount of mucus at his trachea and she yelled at this RN that I haven't been in the room and should have been watching him better. Pt was not in any distress, his oxygen sat was fine, he was not coughing or having a difficult time managing his secretions.  Offered this wife many ways of caring for her husband, this RN has spent more time with him than anyone else, and no matter what care has been given to the pt, she is demanding and unhappy with the care provided.  She is requesting to speak to the admitting doctor and is demanding that he is moved to ICU now because according to her, staff here is too busy to provide care for him as she feels he needs.  The wife is unable to reason with.  Charge nurse has been made aware, but wife is demanding to speak to patient relations or someone higher to voice her concerns.

## 2023-04-19 NOTE — Assessment & Plan Note (Addendum)
Hold Lasix for the rest of today..  Patient on tube feedings.  Patient on Coreg.  Repeat echo showed a stable EF of 30 to 35%.  Weight on 2/13 was 263 pounds, today's weight is 232.37 pounds.  Not sure how accurate that weights are.

## 2023-04-19 NOTE — Hospital Course (Addendum)
 53 y.o. 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   Patient is nonverbal.  Per her wife at the bedside, patient has increased shortness of breath with increased tracheal secretion in the past several days.  No fever or chills.  Patient has increased fluid retention, particularly in arms per her wife.  Patient does not see to have chest pain, nausea, vomiting, abdominal pain per his wife.  Patient was found to have oxygen desaturation to 88% on room air, which improved to 99% of 4L -6L oxygen in ED.    Data reviewed independently and ED Course: pt was found to have BNP 349, potassium 5.8, GFR> 60, WBC 17.1, negative PCR for flu, COVID and RSV, troponin 13 --> 15, procalcitonin 0.17.  Chest x-ray showed bilateral basilar opacity (the left is worse than the right).  Temperature 99.1, blood pressure 125/100, heart rate of 108, RR 22.  Patient is admitted to PCU as inpatient.  2/14.  Patient on 40% trach collar.  Patient on IV Lasix to remove fluid and IV antibiotics for pneumonia. 2/15.  Creatinine went up to 1.6 will decrease Lasix down to 20 mg IV twice daily.  Switch tube feedings over to Glucerna that he takes at home. Dietitian to adjust rate to goal and free water. 2/16.  Creatinine up at 1.66.  Will hold Lasix for the rest of today and recheck creatinine tomorrow.  Try to get patient down to his baseline oxygen.  Echocardiogram shows an EF of 30 to 35% stable from previous. 2/17.  Creatinine up at 1.85.  Patient spiked a fever of 102.  Will get blood cultures and change antibiotics to Zithromax and Zosyn.  Chest x-ray showing left lower lobe pneumonia.  Repeat COVID RSV and influenza negative. 2/18.  Creatinine up at 2.22.  Maintenance fluids started.  Added vancomycin. 2/19:  Creatinine continued to get worse, currently at 2.91 with BUN of 96, IV Lasix has been discontinued and he was getting some IV fluid.  No significant volume on bladder scan.  Continue to have fever, maximum recorded at 101.8 over the past 24-hour.  MRSA PCR was negative so vancomycin is being discontinued.  Blood cultures from 12/17 remain negative.  Repeat sputum cultures pending consulting infectious disease, CT chest, abdomen and pelvis, UA with urine culture was ordered. EEG with severe diffuse encephalopathy, no seizure or definitive epileptiform discharges were noted. 2/20: Afebrile this morning but maximum temperature recorded was 100.6 over the past 24-hour.  Worsening renal function, UA with protein urea and rare bacteria, urine cultures pending, CT chest, abdomen and pelvis with cardiomegaly, small bilateral pleural effusion and mild septal thickening, bilateral perihilar groundglass density suggestive of some pulmonary edema.  Also has a confluent consolidation in the left lower lobe and partial right lower lobe suspicious for pneumonia or aspiration. No evidence of any acute intra-abdominal abnormality. Stopping gentle IV fluid and ordered 1 dose of IV Lasix 40 mg Renal ultrasound with some renal echogenicity, no hydronephrosis. Also consulted nephrology 2/21: Remained afebrile, worsening hyperkalemia with potassium at 5.6, worsening renal function with BUN 126 and creatinine 3.48, urine cultures negative, repeat sputum cultures with Pseudomonas and Proteus-continuing Zosyn.  Ordered some Lokelma, nephrology is recommending switching Glucerna with Nepro due to high potassium content.  Need to monitor potassium very closely, patient might require dialysis  if renal function and potassium continue to get worse.  Patient with significant life limiting comorbidities, seems like multiorgan dysfunction, palliative care was again offered but wife declined.  2/22: Patient with concern of worsening  hypoxia requiring higher level of FiO2 and was placed on 10 L of oxygen.  Intermittently having apneic episodes.  Repeat chest x-ray again with concern of pulmonary vascular congestion, received 1 dose of IV Lasix 80 mg, renal function seems plateaued now.  After discussing with nephrology patient was started on Lasix infusion. Also discussed with PCCM and they want him to transfer to ICU for close monitoring as he is high risk to go on a ventilator. Ordered ABG and repeating lactic acid  2/23: Patient underwent bronchoscopy with removal of mucous plugging with pulmonary yesterday.  BAL was sent for culture, remained on 12 L of oxygen with 50% FiO2. Renal function stable with GFR of 20, worsening BUN at 146.  UOP of 1890 recorded over the past 24-hour, patient still quite edematous and on Lasix infusion.  Patient also had diarrhea yesterday, Foley catheter and rectal tube was placed in ICU  2/24: Worsening hypoxia, now placed on heated high flow.  BAL cultures growing Enterococcus faecium and Pseudomonas, patient is on Zosyn PCCM is also on board.  Renal function seems plateaued with worsening BUN which can be due to tube feed.  2/25: Worsening breathing status, now on maximum setting of heated high flow.  Creatinine seems stable with good UOP but BUN continue to get worse.  Another discussion with wife on phone but she wants every possible help, again discussed involving palliative but she does not want it, patient likely need to go on ventilatory support.  PCCM consulted ENT for trach exchange.  Will also need dialysis, nephrology is not sure how he will continue as outpatient under current circumstances.  ID was reconsulted as patient remained afebrile with improving leukocytosis, cultures remain positive with worsening respiratory status.  Patient is critically ill with significant underlying comorbidities and severe anoxic brain injury, extremely poor functional status, increased risk for  mortality.  Addendum.  Patient later coded right after the trach exchange.  ROSC was obtained. PCCM is not taking over.

## 2023-04-19 NOTE — Progress Notes (Signed)
Consult to HF Navigation Team Placed. Unfortunately, this patient does not meet criteria given functional status prohibits education and titration of GDMT. Please feel free to reach out with any questions or medication assistance needs.  Thank you for involving the HF Navigation Team in this patient's care.  Enos Fling, PharmD, BCPS Clinical Pharmacist 10/11/2022 12:50 PM

## 2023-04-19 NOTE — Assessment & Plan Note (Addendum)
Coreg held last night with hypotension.  Blood pressure better today will restart Coreg and increase back to his usual dose this evening.

## 2023-04-19 NOTE — Progress Notes (Signed)
Progress Note   Patient: Billy Cherry VZD:[id number] DOB: 12/06/1970 DOA: 04/18/2023     1 DOS: the patient was seen and examined on 04/19/2023   Brief hospital course: 53 y.o. 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   Patient is nonverbal.  Per her wife at the bedside, patient has increased shortness of breath with increased tracheal secretion in the past several days.  No fever or chills.  Patient has increased fluid retention, particularly in arms per her wife.  Patient does not see to have chest pain, nausea, vomiting, abdominal pain per his wife.  Patient was found to have oxygen desaturation to 88% on room air, which improved to 99% of 4L -6L oxygen in ED.    Data reviewed independently and ED Course: pt was found to have BNP 349, potassium 5.8, GFR> 60, WBC 17.1, negative PCR for flu, COVID and RSV, troponin 13 --> 15, procalcitonin 0.17.  Chest x-ray showed bilateral basilar opacity (the left is worse than the right).  Temperature 99.1, blood pressure 125/100, heart rate of 108, RR 22.  Patient is admitted to PCU as inpatient.  2/14.  Patient on 40% trach collar.  Patient on IV Lasix to remove fluid and IV antibiotics for pneumonia.  Assessment and Plan: * Acute on chronic systolic CHF (congestive heart failure) ([name]) Continue diuresis with Lasix 40 mg IV twice daily.  Patient on tube feedings.  Patient on Coreg.  Repeat echo.  Multifocal pneumonia Patient initially placed on vancomycin Maxipime and Flagyl.  Since MRSA PCR is negative discontinue vancomycin.  Acute on chronic hypoxic respiratory failure ([name]) Patient normally wears 6 L.  1 pulse ox of 88%.  Currently on 40% Ventimask.  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  Anemia, unspecified Will add on a ferritin to further clarify anemia.  Last hemoglobin 9.1  Anoxic encephalopathy ([name]) Supportive care        Subjective: Patient unable to speak.  Patient coming in with swelling and shortness of breath increased oxygen requirements and having low pulse ox and fast and low pulse.  Physical Exam: Vitals:   04/19/23 1300 04/19/23 1330 04/19/23 1445 04/19/23 1500  BP: 104/76 94/69  102/76  Pulse: 98 91  94  Resp:      Temp:      TempSrc:      SpO2: 97% 95% 95%   Weight:      Height:       Physical Exam HENT:     Head: Normocephalic.  Eyes:     General: Lids are normal.  Cardiovascular:     Rate and Rhythm: Normal rate and regular rhythm.     Heart sounds: Normal heart sounds, S1 normal and S2 normal.  Pulmonary:     Breath sounds: Examination of the right-lower field reveals decreased breath sounds and rales. Examination of the left-lower field reveals decreased breath sounds and rales. Decreased breath sounds and rales present. No wheezing or rhonchi.  Abdominal:     Palpations: Abdomen  is soft.     Tenderness: There is no abdominal tenderness.  Musculoskeletal:     Right lower leg: Swelling present.     Left lower leg: Swelling present.  Skin:    General: Skin is warm.     Findings: No rash.  Neurological:     Mental Status: He is alert.     Data Reviewed: Potassium 5.3, creatinine 1.38, BNP 349, white blood cell count 11.4, hemoglobin 9.1, platelet count 152  Family Communication: Spoke with wife at the bedside this morning and wife on the phone this afternoon.  Patient's wife upset  about the care that he is receiving in the emergency room and wants him to have an ICU bed.  Spoke with nursing supervisor we do not have any ICU beds at this point and once a bed opens up on the progressive care unit they will go there.  Disposition: Status is: Inpatient Remains inpatient appropriate because: Continue IV Lasix and IV antibiotics  Planned Discharge Destination: Home    Time spent: 35 minutes  Author: Alford [name], MD 04/19/2023 3:40 PM  For on call review [email or web address].

## 2023-04-19 NOTE — Assessment & Plan Note (Signed)
Supportive care

## 2023-04-19 NOTE — Assessment & Plan Note (Deleted)
Patient started on Maxipime and Flagyl.  Vancomycin was discontinued with MRSA PE CR being negative

## 2023-04-19 NOTE — Assessment & Plan Note (Signed)
Patient started on Maxipime and Flagyl.  Vancomycin was discontinued with MRSA PE CR being negative

## 2023-04-19 NOTE — Assessment & Plan Note (Addendum)
Patient normally wears 6 L.  1 pulse ox of 88%.  Currently on 40% Ventimask.  Today saturating well on 40% oxygen 8 L flow.  Likely have room to titrate down to his baseline.

## 2023-04-19 NOTE — Assessment & Plan Note (Deleted)
Will add on a ferritin to further clarify anemia.  Last hemoglobin 9.6

## 2023-04-19 NOTE — Assessment & Plan Note (Signed)
On Eliquis

## 2023-04-19 NOTE — Assessment & Plan Note (Addendum)
Continue Keppra and Depakote.  Will get an EEG.

## 2023-04-19 NOTE — Consult Note (Signed)
Pharmacy Antibiotic Note  Billy Cherry is a 53 y.o. male admitted on 04/18/2023 with  shortness of breath .  CXR concerning for pneumonia. Pharmacy has been consulted for Vancomycin and Cefepime dosing.  Plan: Vancomycin 2000 mg IV x 1 as loading dose, followed by: Vancomycin 1000 Q12 hours. Goal AUC 400-550. Expected AUC: 515.6 Expected Cmin: 15.1 SCr used: 1.26, Vd used: 0.5  Cefepime 2g IV Q8 hours   Height: 5\' 11"  (180.3 cm) Weight: 119.3 kg (263 lb) IBW/kg (Calculated) : 75.3  Temp (24hrs), Avg:98.8 F (37.1 C), Min:98.5 F (36.9 C), Max:99.1 F (37.3 C)  Recent Labs  Lab 04/18/23 1734 04/18/23 1900 04/18/23 2032  WBC 17.0*  --   --   CREATININE 1.26*  --   --   LATICACIDVEN  --  2.0* 1.9    Estimated Creatinine Clearance: 90.1 mL/min (A) (by C-G formula based on SCr of 1.26 mg/dL (H)).    Allergies  Allergen Reactions   Lisinopril Swelling    angioedema  Other Reaction(s): angioedema    Antimicrobials this admission: Vancomycin 2/13 >>  Cefepime 2/13 >>  Metronidazole 2/14 >>  Dose adjustments this admission: N/A  Microbiology results: 2/13 BCx: collected 2/14 MRSA PCR: collected  Thank you for allowing pharmacy to be a part of this patient's care.  Harrell Lark A Zaylin Pistilli 04/19/2023 12:47 AM

## 2023-04-19 NOTE — Assessment & Plan Note (Deleted)
Watch closely with diuresis.

## 2023-04-19 NOTE — ED Notes (Signed)
RT called for breathing treatment--pt was given a hospital bed and cleaned up.  Wife of the pt is very demanding and critical of nursing staff and how he is cared for.   Wife of pt was given cleaning supplies and we're awaiting some supplies for oral care so she can give the pt oral care.

## 2023-04-19 NOTE — Assessment & Plan Note (Addendum)
Continue tube feedings.  Changed over to Glucerna

## 2023-04-19 NOTE — Plan of Care (Signed)
Problem: Education: Goal: Ability to describe self-care measures that may prevent or decrease complications (Diabetes Survival Skills Education) will improve Outcome: Progressing Goal: Individualized Educational Video(s) Outcome: Progressing   Problem: Coping: Goal: Ability to adjust to condition or change in health will improve Outcome: Progressing   Problem: Fluid Volume: Goal: Ability to maintain a balanced intake and output will improve Outcome: Progressing   Problem: Health Behavior/Discharge Planning: Goal: Ability to identify and utilize available resources and services will improve Outcome: Progressing Goal: Ability to manage health-related needs will improve Outcome: Progressing   Problem: Metabolic: Goal: Ability to maintain appropriate glucose levels will improve Outcome: Progressing   Problem: Nutritional: Goal: Maintenance of adequate nutrition will improve Outcome: Progressing Goal: Progress toward achieving an optimal weight will improve Outcome: Progressing   Problem: Skin Integrity: Goal: Risk for impaired skin integrity will decrease Outcome: Progressing   Problem: Tissue Perfusion: Goal: Adequacy of tissue perfusion will improve Outcome: Progressing   Problem: Education: Goal: Knowledge of General Education information will improve Description: Including pain rating scale, medication(s)/side effects and non-pharmacologic comfort measures Outcome: Progressing   Problem: Health Behavior/Discharge Planning: Goal: Ability to manage health-related needs will improve Outcome: Progressing   Problem: Clinical Measurements: Goal: Ability to maintain clinical measurements within normal limits will improve Outcome: Progressing Goal: Will remain free from infection Outcome: Progressing Goal: Diagnostic test results will improve Outcome: Progressing Goal: Respiratory complications will improve Outcome: Progressing Goal: Cardiovascular complication will  be avoided Outcome: Progressing   Problem: Activity: Goal: Risk for activity intolerance will decrease Outcome: Progressing   Problem: Nutrition: Goal: Adequate nutrition will be maintained Outcome: Progressing   Problem: Coping: Goal: Level of anxiety will decrease Outcome: Progressing   Problem: Elimination: Goal: Will not experience complications related to bowel motility Outcome: Progressing Goal: Will not experience complications related to urinary retention Outcome: Progressing   Problem: Pain Managment: Goal: General experience of comfort will improve and/or be controlled Outcome: Progressing   Problem: Safety: Goal: Ability to remain free from injury will improve Outcome: Progressing   Problem: Skin Integrity: Goal: Risk for impaired skin integrity will decrease Outcome: Progressing   Problem: Education: Goal: Ability to demonstrate management of disease process will improve Outcome: Progressing Goal: Ability to verbalize understanding of medication therapies will improve Outcome: Progressing Goal: Individualized Educational Video(s) Outcome: Progressing   Problem: Activity: Goal: Capacity to carry out activities will improve Outcome: Progressing   Problem: Cardiac: Goal: Ability to achieve and maintain adequate cardiopulmonary perfusion will improve Outcome: Progressing   Problem: Activity: Goal: Ability to tolerate increased activity will improve Outcome: Progressing   Problem: Clinical Measurements: Goal: Ability to maintain a body temperature in the normal range will improve Outcome: Progressing   Problem: Respiratory: Goal: Ability to maintain adequate ventilation will improve Outcome: Progressing Goal: Ability to maintain a clear airway will improve Outcome: Progressing

## 2023-04-19 NOTE — Assessment & Plan Note (Addendum)
Resolved

## 2023-04-19 NOTE — Progress Notes (Signed)
Pt. Suctionned for small amt. Of white thick secretions.pt. tol. Well.

## 2023-04-19 NOTE — ED Notes (Signed)
Dr. Renae Gloss did end up speaking with the pt's wife about finding a room for the pt.  The wife demanded to speak to not only the charge nurse, but the management as well.  Management did come in to speak to the wife.  Prior to the above, pt had been cared for, and cleaned, tube feeds given, and medications given via tube feed and pt was cleaned up and placed in a new brief.  Wife of the pt commented that the care provided wasn't like the care that she would give and that "The nurses here don't know how to care for my husband!" This RN did try to accommodate for all needs that the wife was demanding, however it seemed nothing offered or given or done for the pt was accepted as adequate care per wife.

## 2023-04-19 NOTE — Assessment & Plan Note (Addendum)
On 40% oxygen, 6 L flow.

## 2023-04-20 ENCOUNTER — Inpatient Hospital Stay
Admit: 2023-04-20 | Discharge: 2023-04-20 | Disposition: A | Discharge status: Home / Self Care | Payer: Medicaid Other | Attending: Internal Medicine | Admitting: Internal Medicine

## 2023-04-20 DIAGNOSIS — I5023 Acute on chronic systolic (congestive) heart failure: Secondary | ICD-10-CM | POA: Diagnosis not present

## 2023-04-20 DIAGNOSIS — I5021 Acute systolic (congestive) heart failure: Secondary | ICD-10-CM | POA: Diagnosis not present

## 2023-04-20 DIAGNOSIS — J189 Pneumonia, unspecified organism: Secondary | ICD-10-CM | POA: Diagnosis not present

## 2023-04-20 DIAGNOSIS — N184 Chronic kidney disease, stage 4 (severe): Secondary | ICD-10-CM | POA: Insufficient documentation

## 2023-04-20 DIAGNOSIS — N189 Chronic kidney disease, unspecified: Secondary | ICD-10-CM

## 2023-04-20 DIAGNOSIS — N179 Acute kidney failure, unspecified: Secondary | ICD-10-CM

## 2023-04-20 DIAGNOSIS — J9621 Acute and chronic respiratory failure with hypoxia: Secondary | ICD-10-CM | POA: Diagnosis not present

## 2023-04-20 DIAGNOSIS — E875 Hyperkalemia: Secondary | ICD-10-CM | POA: Diagnosis not present

## 2023-04-20 LAB — ECHOCARDIOGRAM COMPLETE
AV Peak grad: 3.6 mm[Hg]
Ao pk vel: 0.95 m/s
Area-P 1/2: 4.89 cm2
Height: 71 in
S' Lateral: 5 cm
Single Plane A4C EF: 35.2 %
Weight: 3996.5 [oz_av]

## 2023-04-20 LAB — GLUCOSE, CAPILLARY
Glucose-Capillary: 168 mg/dL — ABNORMAL HIGH (ref 70–99)
Glucose-Capillary: 176 mg/dL — ABNORMAL HIGH (ref 70–99)
Glucose-Capillary: 137 mg/dL — ABNORMAL HIGH (ref 70–99)
Glucose-Capillary: 138 mg/dL — ABNORMAL HIGH (ref 70–99)
Glucose-Capillary: 156 mg/dL — ABNORMAL HIGH (ref 70–99)

## 2023-04-20 LAB — FERRITIN: Ferritin: 53 ng/mL (ref 24–336)

## 2023-04-20 LAB — CBC
HCT: 31 % — ABNORMAL LOW (ref 39.0–52.0)
Hemoglobin: 9.6 g/dL — ABNORMAL LOW (ref 13.0–17.0)
MCH: 33.6 pg (ref 26.0–34.0)
MCHC: 31 g/dL (ref 30.0–36.0)
MCV: 108.4 fL — ABNORMAL HIGH (ref 80.0–100.0)
Platelets: 158 10*3/uL (ref 150–400)
RBC: 2.86 MIL/uL — ABNORMAL LOW (ref 4.22–5.81)
RDW: 15.6 % — ABNORMAL HIGH (ref 11.5–15.5)
WBC: 7.9 10*3/uL (ref 4.0–10.5)
nRBC: 0 % (ref 0.0–0.2)

## 2023-04-20 LAB — BASIC METABOLIC PANEL
Anion gap: 7 (ref 5–15)
BUN: 64 mg/dL — ABNORMAL HIGH (ref 6–20)
CO2: 34 mmol/L — ABNORMAL HIGH (ref 22–32)
Calcium: 8.8 mg/dL — ABNORMAL LOW (ref 8.9–10.3)
Chloride: 102 mmol/L (ref 98–111)
Creatinine, Ser: 1.6 mg/dL — ABNORMAL HIGH (ref 0.61–1.24)
GFR, Estimated: 52 mL/min — ABNORMAL LOW (ref >60)
Glucose, Bld: 156 mg/dL — ABNORMAL HIGH (ref 70–99)
Potassium: 4.8 mmol/L (ref 3.5–5.1)
Sodium: 143 mmol/L (ref 135–145)

## 2023-04-20 MED ORDER — GLUCERNA 1.5 CAL PO LIQD
1000.0000 mL | ORAL | Status: DC
  Administered 2023-04-23 – 2023-04-24 (×3): 1000 mL

## 2023-04-20 MED ORDER — FUROSEMIDE 10 MG/ML IJ SOLN
20.0000 mg | Freq: Two times a day (BID) | INTRAMUSCULAR | Status: DC
  Administered 2023-04-20 – 2023-04-21 (×2): 20 mg via INTRAVENOUS
  Filled 2023-04-20 (×2): qty 2

## 2023-04-20 MED ORDER — GLUCERNA 1.5 CAL PO LIQD
1000.0000 mL | ORAL | Status: DC
  Administered 2023-04-20: 1000 mL

## 2023-04-20 NOTE — Assessment & Plan Note (Signed)
AKI on CKD stage II.  Creatinine up to 2.91 with diuresis.  Continue to hold diuresis.  Patient on gentle fluids. -Monitor renal function -Avoid nephrotoxins

## 2023-04-20 NOTE — Plan of Care (Signed)
   Problem: Education: Goal: Ability to describe self-care measures that may prevent or decrease complications (Diabetes Survival Skills Education) will improve Outcome: Progressing Goal: Individualized Educational Video(s) Outcome: Progressing   Problem: Coping: Goal: Ability to adjust to condition or change in health will improve Outcome: Progressing   Problem: Fluid Volume: Goal: Ability to maintain a balanced intake and output will improve Outcome: Progressing   Problem: Health Behavior/Discharge Planning: Goal: Ability to identify and utilize available resources and services will improve Outcome: Progressing Goal: Ability to manage health-related needs will improve Outcome: Progressing   Problem: Metabolic: Goal: Ability to maintain appropriate glucose levels will improve Outcome: Progressing   Problem: Nutritional: Goal: Maintenance of adequate nutrition will improve Outcome: Progressing Goal: Progress toward achieving an optimal weight will improve Outcome: Progressing   Problem: Skin Integrity: Goal: Risk for impaired skin integrity will decrease Outcome: Progressing   Problem: Tissue Perfusion: Goal: Adequacy of tissue perfusion will improve Outcome: Progressing   Problem: Education: Goal: Knowledge of General Education information will improve Description: Including pain rating scale, medication(s)/side effects and non-pharmacologic comfort measures Outcome: Progressing   Problem: Health Behavior/Discharge Planning: Goal: Ability to manage health-related needs will improve Outcome: Progressing   Problem: Clinical Measurements: Goal: Ability to maintain clinical measurements within normal limits will improve Outcome: Progressing Goal: Will remain free from infection Outcome: Progressing Goal: Diagnostic test results will improve Outcome: Progressing Goal: Respiratory complications will improve Outcome: Progressing Goal: Cardiovascular complication will  be avoided Outcome: Progressing   Problem: Activity: Goal: Risk for activity intolerance will decrease Outcome: Progressing   Problem: Nutrition: Goal: Adequate nutrition will be maintained Outcome: Progressing   Problem: Coping: Goal: Level of anxiety will decrease Outcome: Progressing   Problem: Elimination: Goal: Will not experience complications related to bowel motility Outcome: Progressing Goal: Will not experience complications related to urinary retention Outcome: Progressing   Problem: Pain Managment: Goal: General experience of comfort will improve and/or be controlled Outcome: Progressing   Problem: Safety: Goal: Ability to remain free from injury will improve Outcome: Progressing   Problem: Skin Integrity: Goal: Risk for impaired skin integrity will decrease Outcome: Progressing   Problem: Education: Goal: Ability to demonstrate management of disease process will improve Outcome: Progressing Goal: Ability to verbalize understanding of medication therapies will improve Outcome: Progressing Goal: Individualized Educational Video(s) Outcome: Progressing   Problem: Activity: Goal: Capacity to carry out activities will improve Outcome: Progressing   Problem: Cardiac: Goal: Ability to achieve and maintain adequate cardiopulmonary perfusion will improve Outcome: Progressing   Problem: Activity: Goal: Ability to tolerate increased activity will improve Outcome: Progressing   Problem: Clinical Measurements: Goal: Ability to maintain a body temperature in the normal range will improve Outcome: Progressing   Problem: Respiratory: Goal: Ability to maintain adequate ventilation will improve Outcome: Progressing Goal: Ability to maintain a clear airway will improve Outcome: Progressing

## 2023-04-20 NOTE — Progress Notes (Addendum)
Initial Nutrition Assessment  DOCUMENTATION CODES:   Not applicable  INTERVENTION:  Initiate tube feeding via PEG: Glucerna 1.5 at 65 ml/h (1560 ml per day) 200 ml FWF Q6H  Provides 2340 kcal, 128 gm protein, 1184 ml free water daily (1984 ml water daily TF + FWF)  NUTRITION DIAGNOSIS:   Inadequate oral intake related to dysphagia as evidenced by NPO status.   GOAL:   Patient will meet greater than or equal to 90% of their needs   MONITOR:   Weight trends, TF tolerance, Labs, I & O's  REASON FOR ASSESSMENT:   Consult Enteral/tube feeding initiation and management  ASSESSMENT:  53 y.o admitted for SOB from CHF exacerbation. PMH of anoxic brain injury, non-verbal, bedbound, s/p Trach and PEG tube placement, CHF, HTN, DVT, HTN, DM, CKD II, seizure disorder.  Spoke to wife over the phone, she is his primary caregiver. She reports his tube feeding regimen to be Glucerna 1.5 @ 55 ml/hr with 155 ml water flushes every 4 hours through his PEG tube. She reports no issues with this regimen. She did mention he was on Osmolite last year and was having high blood sugars and emesis that resolved when switching to Glucerna. No issues with BM at home, takes laxatives twice a day.  Reports UBW to be around 230 lbs and suspects current weight is from fluid. Answered any questions wife had. Started on lasix 2/14. Current tube feeds running at 50 ml/hr, will increase tube feeds to goal rate.   Admit weight: 119.3 kg Current weight: 113.3 kg   *Weight loss form Lasix. UBW:  103.6 kg  Average Meal Intake: NPO  Nutritionally Relevant Medications: Scheduled Meds:  famotidine  20 mg Per Tube BID   free water  200 mL Per Tube Q6H   furosemide  20 mg Intravenous Q12H   insulin aspart  0-5 Units Subcutaneous QHS   insulin aspart  0-9 Units Subcutaneous TID WC   insulin glargine-yfgn  10 Units Subcutaneous QHS   metoCLOPramide  10 mg Per Tube Q8H   polyethylene glycol  17 g Oral BID    Continuous Infusions:  ceFEPime (MAXIPIME) IV 2 g (04/20/23 0928)   feeding supplement (GLUCERNA 1.5 CAL)     metronidazole Stopped (04/20/23 0255)   Labs Reviewed: Potassium 4.8, BUN 64, Creatinine 1.6, Calcium 8.8 CBG ranges from 129-176 mg/dL over the last 24 hours HgbA1c 5.4 (2023)  NUTRITION - FOCUSED PHYSICAL EXAM:  - Deferred to follow up   Diet Order:   Diet Order             Diet NPO time specified  Diet effective now                   EDUCATION NEEDS:   No education needs have been identified at this time  Skin:  Skin Assessment: Reviewed RN Assessment  Last BM:  04/19/2023  Height:   Ht Readings from Last 1 Encounters:  04/18/23 5\' 11"  (1.803 m)    Weight:   Wt Readings from Last 1 Encounters:  04/20/23 113.3 kg    Ideal Body Weight:  78.2 kg  BMI:  Body mass index is 34.84 kg/m.  Estimated Nutritional Needs:   Kcal:  2300-2500 kcal  Protein:  110-125 gm  Fluid:  >2L per day   Elliot Dally, RD Registered Dietitian  See Amion for more information

## 2023-04-20 NOTE — Progress Notes (Signed)
  Echocardiogram 2D Echocardiogram has been performed.  DONNAVIN VANDENBRINK 04/20/2023, 11:47 AM

## 2023-04-20 NOTE — Progress Notes (Signed)
Progress Note   Patient: Billy Cherry [id number]:[id number] DOB: [birth year] DOA: 04/18/2023     2 DOS: the patient was seen and examined on 04/20/2023   Brief hospital course: 53 y.o. 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   Patient is nonverbal.  Per her wife at the bedside, patient has increased shortness of breath with increased tracheal secretion in the past several days.  No fever or chills.  Patient has increased fluid retention, particularly in arms per her wife.  Patient does not see to have chest pain, nausea, vomiting, abdominal pain per his wife.  Patient was found to have oxygen desaturation to 88% on room air, which improved to 99% of 4L -6L oxygen in ED.    Data reviewed independently and ED Course: pt was found to have BNP 349, potassium 5.8, GFR> 60, WBC 17.1, negative PCR for flu, COVID and RSV, troponin 13 --> 15, procalcitonin 0.17.  Chest x-ray showed bilateral basilar opacity (the left is worse than the right).  Temperature 99.1, blood pressure 125/100, heart rate of 108, RR 22.  Patient is admitted to PCU as inpatient.  2/14.  Patient on 40% trach collar.  Patient on IV Lasix to remove fluid and IV antibiotics for pneumonia. 2/15.  Creatinine went up to 1.6 will decrease Lasix down to 20 mg IV twice daily.  Switch tube feedings over to Glucerna that he takes at home. Dietitian to adjust rate to goal and free water.  Assessment and Plan: * Acute on chronic systolic CHF (congestive heart failure) ([name]) Continue diuresis with Lasix 20 mg IV twice daily.  Patient on tube feedings.  Patient on Coreg.  Repeat echo pending read.  Weight on 2/13 was 263 pounds, today's weight is 249.78 pounds.  Multifocal pneumonia Patient initially placed on vancomycin Maxipime  and Flagyl.  Since MRSA PCR is negative, vancomycin was discontinued.  Acute on chronic hypoxic respiratory failure ([name]) Patient normally wears 6 L.  1 pulse ox of 88%.  Currently on 40% Ventimask.  Today saturating well on 40% oxygen 8 L flow.  Likely have room to titrate down to his baseline.  Acute kidney injury superimposed on CKD ([name]) AKI on CKD stage II.  Creatinine up to 1.6 with diuresis cut back Lasix to 20 mg IV twice daily and recheck creatinine tomorrow.  Creatinine 1.26 on presentation.  Hyperkalemia Resolved  Seizure disorder ([name]) Continue Keppra  Essential hypertension Continue Coreg  Pulmonary embolus ([name]) On Eliquis  DVT (deep venous thrombosis) ([name]) On Eliquis  S/P percutaneous endoscopic gastrostomy (PEG) tube placement ([name]) Continue tube feedings.  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  Last hemoglobin 9.6  Anoxic encephalopathy ([name]) Supportive care        Subjective: Patient was being washed up and changed by his wife.  Patient is still on 40% oxygen.  Admitted with heart failure and pneumonia.  One of the patient's IVs came out before I was in the room.  Physical Exam: Vitals:   04/20/23 0810 04/20/23 0949 04/20/23 1132 04/20/23 1200  BP:    114/85  Pulse:    (!) 101  Resp:    20  Temp:    98.9 F (37.2 C)  TempSrc:  Axillary  SpO2: 97% 98% 98% 98%  Weight:      Height:       Physical Exam HENT:     Head: Normocephalic.  Eyes:     General: Lids are normal.  Cardiovascular:     Rate and Rhythm: Normal rate and regular rhythm.     Heart sounds: Normal heart sounds, S1 normal and S2 normal.  Pulmonary:     Breath sounds: Examination of the right-lower field reveals decreased breath sounds and rales. Examination of the left-lower field reveals decreased breath sounds and rales. Decreased breath sounds and rales present. No  wheezing or rhonchi.  Abdominal:     Palpations: Abdomen is soft.     Tenderness: There is no abdominal tenderness.  Musculoskeletal:     Right ankle: Swelling present.     Left ankle: Swelling present.  Skin:    General: Skin is warm.     Findings: No rash.  Neurological:     Mental Status: He is alert.     Data Reviewed: Hemoglobin 9.6, white blood cell count 7.9, platelet count 158, creatinine 1.6, potassium 4.8  Family Communication: Spoke with wife at bedside  Disposition: Status is: Inpatient Remains inpatient appropriate because: Watch creatinine with diuresis.  Planned Discharge Destination: Home    Time spent: 27 minutes Case discussed with charge nurse and nursing staff.  Author: Alford [name], MD 04/20/2023 3:12 PM  For on call review [email or web address].

## 2023-04-21 DIAGNOSIS — J189 Pneumonia, unspecified organism: Secondary | ICD-10-CM | POA: Diagnosis not present

## 2023-04-21 DIAGNOSIS — I5023 Acute on chronic systolic (congestive) heart failure: Secondary | ICD-10-CM | POA: Diagnosis not present

## 2023-04-21 DIAGNOSIS — E875 Hyperkalemia: Secondary | ICD-10-CM | POA: Diagnosis not present

## 2023-04-21 DIAGNOSIS — D508 Other iron deficiency anemias: Secondary | ICD-10-CM

## 2023-04-21 DIAGNOSIS — I959 Hypotension, unspecified: Secondary | ICD-10-CM

## 2023-04-21 DIAGNOSIS — I9589 Other hypotension: Secondary | ICD-10-CM

## 2023-04-21 DIAGNOSIS — D509 Iron deficiency anemia, unspecified: Secondary | ICD-10-CM | POA: Insufficient documentation

## 2023-04-21 DIAGNOSIS — J9621 Acute and chronic respiratory failure with hypoxia: Secondary | ICD-10-CM | POA: Diagnosis not present

## 2023-04-21 LAB — BASIC METABOLIC PANEL
Anion gap: 15 (ref 5–15)
BUN: 66 mg/dL — ABNORMAL HIGH (ref 6–20)
CO2: 28 mmol/L (ref 22–32)
Calcium: 8.6 mg/dL — ABNORMAL LOW (ref 8.9–10.3)
Chloride: 101 mmol/L (ref 98–111)
Creatinine, Ser: 1.66 mg/dL — ABNORMAL HIGH (ref 0.61–1.24)
GFR, Estimated: 49 mL/min — ABNORMAL LOW (ref >60)
Glucose, Bld: 121 mg/dL — ABNORMAL HIGH (ref 70–99)
Potassium: 5.3 mmol/L — ABNORMAL HIGH (ref 3.5–5.1)
Sodium: 144 mmol/L (ref 135–145)

## 2023-04-21 LAB — GLUCOSE, CAPILLARY
Glucose-Capillary: 127 mg/dL — ABNORMAL HIGH (ref 70–99)
Glucose-Capillary: 141 mg/dL — ABNORMAL HIGH (ref 70–99)
Glucose-Capillary: 141 mg/dL — ABNORMAL HIGH (ref 70–99)
Glucose-Capillary: 142 mg/dL — ABNORMAL HIGH (ref 70–99)
Glucose-Capillary: 146 mg/dL — ABNORMAL HIGH (ref 70–99)
Glucose-Capillary: 147 mg/dL — ABNORMAL HIGH (ref 70–99)

## 2023-04-21 LAB — CULTURE, RESPIRATORY W GRAM STAIN

## 2023-04-21 LAB — CBC
HCT: 32 % — ABNORMAL LOW (ref 39.0–52.0)
Hemoglobin: 9.7 g/dL — ABNORMAL LOW (ref 13.0–17.0)
MCH: 33 pg (ref 26.0–34.0)
MCHC: 30.3 g/dL (ref 30.0–36.0)
MCV: 108.8 fL — ABNORMAL HIGH (ref 80.0–100.0)
Platelets: 148 10*3/uL — ABNORMAL LOW (ref 150–400)
RBC: 2.94 MIL/uL — ABNORMAL LOW (ref 4.22–5.81)
RDW: 15.6 % — ABNORMAL HIGH (ref 11.5–15.5)
WBC: 9.9 10*3/uL (ref 4.0–10.5)
nRBC: 0.2 % (ref 0.0–0.2)

## 2023-04-21 LAB — LEGIONELLA PNEUMOPHILA SEROGP 1 UR AG: L. pneumophila Serogp 1 Ur Ag: NEGATIVE

## 2023-04-21 LAB — HEMOGLOBIN A1C
Hgb A1c MFr Bld: 5.2 % (ref 4.8–5.6)
Mean Plasma Glucose: 102.54 mg/dL

## 2023-04-21 MED ORDER — SODIUM ZIRCONIUM CYCLOSILICATE 10 G PO PACK
10.0000 g | PACK | Freq: Once | ORAL | Status: AC
  Administered 2023-04-21: 10 g
  Filled 2023-04-21: qty 1

## 2023-04-21 MED ORDER — MIDODRINE HCL 5 MG PO TABS
5.0000 mg | ORAL_TABLET | Freq: Three times a day (TID) | ORAL | Status: DC
  Administered 2023-04-21: 5 mg
  Filled 2023-04-21: qty 1

## 2023-04-21 MED ORDER — FUROSEMIDE 10 MG/ML IJ SOLN: 20.0000 mg | Freq: Two times a day (BID) | INTRAMUSCULAR | Status: DC

## 2023-04-21 MED ORDER — CARVEDILOL 3.125 MG PO TABS
3.1250 mg | ORAL_TABLET | Freq: Two times a day (BID) | ORAL | Status: DC
  Administered 2023-04-22: 3.125 mg
  Filled 2023-04-21: qty 1

## 2023-04-21 MED ORDER — IPRATROPIUM-ALBUTEROL 0.5-2.5 (3) MG/3ML IN SOLN
3.0000 mL | Freq: Four times a day (QID) | RESPIRATORY_TRACT | Status: DC
  Administered 2023-04-21 – 2023-04-22 (×4): 3 mL via RESPIRATORY_TRACT
  Filled 2023-04-21 (×3): qty 3

## 2023-04-21 MED ORDER — CARVEDILOL 3.125 MG PO TABS: 3.1250 mg | ORAL_TABLET | Freq: Two times a day (BID) | ORAL | Status: DC

## 2023-04-21 MED ORDER — INSULIN ASPART 100 UNIT/ML IJ SOLN
0.0000 [IU] | INTRAMUSCULAR | Status: DC
  Administered 2023-04-21 (×5): 1 [IU] via SUBCUTANEOUS
  Administered 2023-04-22 (×3): 2 [IU] via SUBCUTANEOUS
  Administered 2023-04-22: 1 [IU] via SUBCUTANEOUS
  Administered 2023-04-22: 2 [IU] via SUBCUTANEOUS
  Administered 2023-04-22: 3 [IU] via SUBCUTANEOUS
  Administered 2023-04-23 (×3): 2 [IU] via SUBCUTANEOUS
  Administered 2023-04-23: 3 [IU] via SUBCUTANEOUS
  Administered 2023-04-23: 2 [IU] via SUBCUTANEOUS
  Administered 2023-04-23: 3 [IU] via SUBCUTANEOUS
  Administered 2023-04-24 (×3): 2 [IU] via SUBCUTANEOUS
  Administered 2023-04-24: 3 [IU] via SUBCUTANEOUS
  Administered 2023-04-24: 1 [IU] via SUBCUTANEOUS
  Administered 2023-04-24 – 2023-04-25 (×7): 2 [IU] via SUBCUTANEOUS
  Administered 2023-04-25: 1 [IU] via SUBCUTANEOUS
  Administered 2023-04-26: 3 [IU] via SUBCUTANEOUS
  Administered 2023-04-26 – 2023-04-27 (×6): 2 [IU] via SUBCUTANEOUS
  Administered 2023-04-27: 3 [IU] via SUBCUTANEOUS
  Administered 2023-04-27 (×3): 2 [IU] via SUBCUTANEOUS
  Administered 2023-04-28: 5 [IU] via SUBCUTANEOUS
  Administered 2023-04-28 (×2): 3 [IU] via SUBCUTANEOUS
  Administered 2023-04-28: 5 [IU] via SUBCUTANEOUS
  Filled 2023-04-21 (×44): qty 1

## 2023-04-21 MED ORDER — METOCLOPRAMIDE HCL 5 MG/ML IJ SOLN
10.0000 mg | Freq: Once | INTRAMUSCULAR | Status: AC
  Administered 2023-04-21: 10 mg via INTRAVENOUS
  Filled 2023-04-21: qty 2

## 2023-04-21 MED ORDER — SODIUM CHLORIDE 0.9 % IV BOLUS
500.0000 mL | Freq: Once | INTRAVENOUS | Status: AC
  Administered 2023-04-21: 500 mL via INTRAVENOUS

## 2023-04-21 NOTE — Plan of Care (Signed)
   Problem: Education: Goal: Ability to describe self-care measures that may prevent or decrease complications (Diabetes Survival Skills Education) will improve Outcome: Progressing Goal: Individualized Educational Video(s) Outcome: Progressing   Problem: Coping: Goal: Ability to adjust to condition or change in health will improve Outcome: Progressing   Problem: Fluid Volume: Goal: Ability to maintain a balanced intake and output will improve Outcome: Progressing   Problem: Health Behavior/Discharge Planning: Goal: Ability to identify and utilize available resources and services will improve Outcome: Progressing Goal: Ability to manage health-related needs will improve Outcome: Progressing   Problem: Metabolic: Goal: Ability to maintain appropriate glucose levels will improve Outcome: Progressing   Problem: Nutritional: Goal: Maintenance of adequate nutrition will improve Outcome: Progressing Goal: Progress toward achieving an optimal weight will improve Outcome: Progressing   Problem: Skin Integrity: Goal: Risk for impaired skin integrity will decrease Outcome: Progressing   Problem: Tissue Perfusion: Goal: Adequacy of tissue perfusion will improve Outcome: Progressing   Problem: Education: Goal: Knowledge of General Education information will improve Description: Including pain rating scale, medication(s)/side effects and non-pharmacologic comfort measures Outcome: Progressing   Problem: Health Behavior/Discharge Planning: Goal: Ability to manage health-related needs will improve Outcome: Progressing   Problem: Clinical Measurements: Goal: Ability to maintain clinical measurements within normal limits will improve Outcome: Progressing Goal: Will remain free from infection Outcome: Progressing Goal: Diagnostic test results will improve Outcome: Progressing Goal: Respiratory complications will improve Outcome: Progressing Goal: Cardiovascular complication will  be avoided Outcome: Progressing   Problem: Activity: Goal: Risk for activity intolerance will decrease Outcome: Progressing   Problem: Nutrition: Goal: Adequate nutrition will be maintained Outcome: Progressing   Problem: Coping: Goal: Level of anxiety will decrease Outcome: Progressing   Problem: Elimination: Goal: Will not experience complications related to bowel motility Outcome: Progressing Goal: Will not experience complications related to urinary retention Outcome: Progressing   Problem: Pain Managment: Goal: General experience of comfort will improve and/or be controlled Outcome: Progressing   Problem: Safety: Goal: Ability to remain free from injury will improve Outcome: Progressing   Problem: Skin Integrity: Goal: Risk for impaired skin integrity will decrease Outcome: Progressing   Problem: Education: Goal: Ability to demonstrate management of disease process will improve Outcome: Progressing Goal: Ability to verbalize understanding of medication therapies will improve Outcome: Progressing Goal: Individualized Educational Video(s) Outcome: Progressing   Problem: Activity: Goal: Capacity to carry out activities will improve Outcome: Progressing   Problem: Cardiac: Goal: Ability to achieve and maintain adequate cardiopulmonary perfusion will improve Outcome: Progressing   Problem: Activity: Goal: Ability to tolerate increased activity will improve Outcome: Progressing   Problem: Clinical Measurements: Goal: Ability to maintain a body temperature in the normal range will improve Outcome: Progressing   Problem: Respiratory: Goal: Ability to maintain adequate ventilation will improve Outcome: Progressing Goal: Ability to maintain a clear airway will improve Outcome: Progressing

## 2023-04-21 NOTE — Progress Notes (Signed)
Called for hypotension by nursing staff.  Physical Exam Cardiovascular:     Rate and Rhythm: Normal rate and regular rhythm.     Heart sounds: Normal heart sounds and S1 normal.  Pulmonary:     Breath sounds: Examination of the right-lower field reveals decreased breath sounds. Examination of the left-lower field reveals decreased breath sounds. Decreased breath sounds present. No wheezing, rhonchi or rales.     Plan: Will give a fluid bolus and midodrine to get bp higher. Hold coreg this evening. Hold lasix Discontinue cardura.  Monitor BP and HR this evening and reasses tomorrow  Dr Alford [name] Spoke with wife at the bedside. 18 minutes

## 2023-04-21 NOTE — Assessment & Plan Note (Signed)
Ferritin 53 and last hemoglobin 9.9.

## 2023-04-21 NOTE — Progress Notes (Signed)
Progress Note   Patient: Billy Cherry MWN:[id number] DOB: [birth year] DOA: 04/18/2023     3 DOS: the patient was seen and examined on 04/21/2023   Brief hospital course: 53 y.o. 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   Patient is nonverbal.  Per her wife at the bedside, patient has increased shortness of breath with increased tracheal secretion in the past several days.  No fever or chills.  Patient has increased fluid retention, particularly in arms per her wife.  Patient does not see to have chest pain, nausea, vomiting, abdominal pain per his wife.  Patient was found to have oxygen desaturation to 88% on room air, which improved to 99% of 4L -6L oxygen in ED.    Data reviewed independently and ED Course: pt was found to have BNP 349, potassium 5.8, GFR> 60, WBC 17.1, negative PCR for flu, COVID and RSV, troponin 13 --> 15, procalcitonin 0.17.  Chest x-ray showed bilateral basilar opacity (the left is worse than the right).  Temperature 99.1, blood pressure 125/100, heart rate of 108, RR 22.  Patient is admitted to PCU as inpatient.  2/14.  Patient on 40% trach collar.  Patient on IV Lasix to remove fluid and IV antibiotics for pneumonia. 2/15.  Creatinine went up to 1.6 will decrease Lasix down to 20 mg IV twice daily.  Switch tube feedings over to Glucerna that he takes at home. Dietitian to adjust rate to goal and free water. 2/16.  Creatinine up at 1.66.  Will hold Lasix for the rest of today and recheck creatinine tomorrow.  Try to get patient down to his baseline oxygen.  Echocardiogram shows an EF of 30 to 35% stable from previous.  Assessment and Plan: * Acute on chronic systolic CHF (congestive heart failure) ([name]) Hold Lasix for the rest of today..  Patient on  tube feedings.  Patient on Coreg.  Repeat echo showed a stable EF of 30 to 35%.  Weight on 2/13 was 263 pounds, today's weight is 232.37 pounds.  Not sure how accurate that weights are.  Multifocal pneumonia Patient initially placed on vancomycin Maxipime and Flagyl.  Since MRSA PCR is negative, vancomycin was discontinued.  Sepsis ruled out.  Acute on chronic hypoxic respiratory failure ([name]) Patient normally wears 6 L.  1 pulse ox of 88%.  Currently on 40% Ventimask.  Today saturating well on 40% oxygen 7 L flow.  Continue to try to titrate down to baseline.  Acute kidney injury superimposed on CKD ([name]) AKI on CKD stage II.  Creatinine up to 1.66 with diuresis.  Will hold diuresis today.  Creatinine 1.26 on presentation.  Hyperkalemia 1 dose of Lokelma today  Seizure disorder ([name]) Continue Keppra  Essential hypertension Continue Coreg  Pulmonary embolus ([name]) On Eliquis  DVT (deep venous thrombosis) ([name]) On Eliquis  S/P percutaneous endoscopic gastrostomy (PEG) tube placement ([name]) Continue Glucerna tube feedings.  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  Admitted with CHF exacerbation and pneumonia.  Physical Exam: Vitals:   04/21/23 0000 04/21/23 0316 04/21/23 0500 04/21/23 0800  BP: 117/81 123/88  116/81  Pulse: 99 (!)  108  (!) 103  Resp: (!) 31 (!) 30  (!) 21  Temp: 98.9 F (37.2 C) 99.1 F (37.3 C)  98.8 F (37.1 C)  TempSrc: Axillary Axillary  Axillary  SpO2: 98% 93%  95%  Weight:   105.4 kg   Height:       Physical Exam HENT:     Head: Normocephalic.  Eyes:     General: Lids are normal.  Cardiovascular:     Rate and Rhythm: Normal rate and regular rhythm.     Heart sounds: Normal heart sounds, S1 normal and S2 normal.  Pulmonary:     Breath sounds:  Examination of the right-lower field reveals decreased breath sounds. Examination of the left-lower field reveals decreased breath sounds. Decreased breath sounds present. No wheezing, rhonchi or rales.  Abdominal:     Palpations: Abdomen is soft.     Tenderness: There is no abdominal tenderness.  Musculoskeletal:     Right ankle: Swelling present.     Left ankle: Swelling present.  Skin:    General: Skin is warm.     Findings: No rash.  Neurological:     Mental Status: He is alert.     Data Reviewed: Potassium 5.3, creatinine 1.66, hemoglobin 9.7, platelet count 148, ferritin 53 and hemoglobin A1c 5.2 Family Communication: Spoke with wife on the phone  Disposition: Status is: Inpatient Remains inpatient appropriate because: Hold Lasix today and recheck creatinine tomorrow.  Try to taper down to baseline oxygen.  Planned Discharge Destination: Home with Home Health    Time spent: 28 minutes  Author: Alford [name], MD 04/21/2023 11:37 AM  For on call review [email or web address].

## 2023-04-21 NOTE — Plan of Care (Signed)
  Problem: Education: Goal: Ability to describe self-care measures that may prevent or decrease complications (Diabetes Survival Skills Education) will improve Outcome: Progressing   Problem: Fluid Volume: Goal: Ability to maintain a balanced intake and output will improve Outcome: Progressing   Problem: Metabolic: Goal: Ability to maintain appropriate glucose levels will improve Outcome: Progressing   Problem: Nutritional: Goal: Maintenance of adequate nutrition will improve Outcome: Progressing   Problem: Skin Integrity: Goal: Risk for impaired skin integrity will decrease Outcome: Progressing   Problem: Clinical Measurements: Goal: Cardiovascular complication will be avoided Outcome: Progressing   Problem: Activity: Goal: Risk for activity intolerance will decrease Outcome: Progressing

## 2023-04-21 NOTE — Progress Notes (Signed)
   04/21/23 1600  Assess: MEWS Score  Temp 98.9 F (37.2 C)  BP (!) 77/37  MAP (mmHg) (!) 49  Pulse Rate 96  Resp (!) 21  SpO2 98 %  O2 Device Tracheostomy Collar  O2 Flow Rate (L/min) 6 L/min  FiO2 (%) 40 %  Assess: MEWS Score  MEWS Temp 0  MEWS Systolic 2  MEWS Pulse 0  MEWS RR 1  MEWS LOC 0  MEWS Score 3  MEWS Score Color Yellow  Assess: if the MEWS score is Yellow or Red  Were vital signs accurate and taken at a resting state? Yes  Does the patient meet 2 or more of the SIRS criteria? No  MEWS guidelines implemented  No, previously yellow, continue vital signs every 4 hours  Notify: Charge Nurse/RN  Name of Charge Nurse/RN Notified Praweena,RN  Provider Notification  Provider Name/Title Alford [name], MD  Date Provider Notified 04/21/23  Time Provider Notified 1616  Method of Notification Page (Secure Chat)  Notification Reason Other (Comment) (BP is down again, 77/37 map 49)  Provider response See new orders  Date of Provider Response 04/21/23  Time of Provider Response 1617  Assess: SIRS CRITERIA  SIRS Temperature  0  SIRS Respirations  1  SIRS Pulse 1  SIRS WBC 0  SIRS Score Sum  2

## 2023-04-22 ENCOUNTER — Inpatient Hospital Stay: Payer: Medicaid Other

## 2023-04-22 DIAGNOSIS — E875 Hyperkalemia: Secondary | ICD-10-CM | POA: Diagnosis not present

## 2023-04-22 DIAGNOSIS — J189 Pneumonia, unspecified organism: Secondary | ICD-10-CM | POA: Diagnosis not present

## 2023-04-22 DIAGNOSIS — I5023 Acute on chronic systolic (congestive) heart failure: Secondary | ICD-10-CM | POA: Diagnosis not present

## 2023-04-22 DIAGNOSIS — N179 Acute kidney failure, unspecified: Secondary | ICD-10-CM | POA: Diagnosis not present

## 2023-04-22 LAB — GLUCOSE, CAPILLARY
Glucose-Capillary: 136 mg/dL — ABNORMAL HIGH (ref 70–99)
Glucose-Capillary: 156 mg/dL — ABNORMAL HIGH (ref 70–99)
Glucose-Capillary: 157 mg/dL — ABNORMAL HIGH (ref 70–99)
Glucose-Capillary: 169 mg/dL — ABNORMAL HIGH (ref 70–99)
Glucose-Capillary: 175 mg/dL — ABNORMAL HIGH (ref 70–99)
Glucose-Capillary: 179 mg/dL — ABNORMAL HIGH (ref 70–99)
Glucose-Capillary: 202 mg/dL — ABNORMAL HIGH (ref 70–99)

## 2023-04-22 LAB — RESP PANEL BY RT-PCR (RSV, FLU A&B, COVID)  RVPGX2
Influenza A by PCR: NEGATIVE
Influenza B by PCR: NEGATIVE
Resp Syncytial Virus by PCR: NEGATIVE
SARS Coronavirus 2 by RT PCR: NEGATIVE

## 2023-04-22 LAB — BASIC METABOLIC PANEL
Anion gap: 10 (ref 5–15)
BUN: 75 mg/dL — ABNORMAL HIGH (ref 6–20)
CO2: 30 mmol/L (ref 22–32)
Calcium: 8.6 mg/dL — ABNORMAL LOW (ref 8.9–10.3)
Chloride: 104 mmol/L (ref 98–111)
Creatinine, Ser: 1.85 mg/dL — ABNORMAL HIGH (ref 0.61–1.24)
GFR, Estimated: 43 mL/min — ABNORMAL LOW (ref >60)
Glucose, Bld: 143 mg/dL — ABNORMAL HIGH (ref 70–99)
Potassium: 4.7 mmol/L (ref 3.5–5.1)
Sodium: 144 mmol/L (ref 135–145)

## 2023-04-22 LAB — HEMOGLOBIN: Hemoglobin: 9.9 g/dL — ABNORMAL LOW (ref 13.0–17.0)

## 2023-04-22 LAB — VITAMIN B12: Vitamin B-12: 995 pg/mL — ABNORMAL HIGH (ref 180–914)

## 2023-04-22 MED ORDER — SODIUM CHLORIDE 0.9 % IV BOLUS
500.0000 mL | Freq: Once | INTRAVENOUS | Status: AC
  Administered 2023-04-22: 500 mL via INTRAVENOUS

## 2023-04-22 MED ORDER — BUDESONIDE 0.25 MG/2ML IN SUSP
0.2500 mg | Freq: Two times a day (BID) | RESPIRATORY_TRACT | Status: DC
  Administered 2023-04-22 – 2023-04-30 (×17): 0.25 mg via RESPIRATORY_TRACT
  Filled 2023-04-22 (×17): qty 2

## 2023-04-22 MED ORDER — ACETYLCYSTEINE 20 % IN SOLN
3.0000 mL | Freq: Two times a day (BID) | RESPIRATORY_TRACT | Status: DC
  Administered 2023-04-22: 3 mL via RESPIRATORY_TRACT
  Filled 2023-04-22 (×2): qty 4

## 2023-04-22 MED ORDER — CARVEDILOL 6.25 MG PO TABS
6.2500 mg | ORAL_TABLET | Freq: Two times a day (BID) | ORAL | Status: DC
Start: 2023-04-22 — End: 2023-05-01
  Administered 2023-04-22 – 2023-05-01 (×17): 6.25 mg
  Filled 2023-04-22 (×19): qty 1

## 2023-04-22 MED ORDER — SODIUM CHLORIDE 0.9 % IV BOLUS
250.0000 mL | Freq: Once | INTRAVENOUS | Status: AC
  Administered 2023-04-22: 250 mL via INTRAVENOUS

## 2023-04-22 MED ORDER — SODIUM CHLORIDE 0.9 % IV SOLN
500.0000 mg | INTRAVENOUS | Status: DC
  Administered 2023-04-22 – 2023-04-23 (×2): 500 mg via INTRAVENOUS
  Filled 2023-04-22 (×23): qty 5

## 2023-04-22 MED ORDER — PIPERACILLIN-TAZOBACTAM 3.375 G IVPB
3.3750 g | Freq: Three times a day (TID) | INTRAVENOUS | Status: DC
  Administered 2023-04-22 – 2023-04-26 (×13): 3.375 g via INTRAVENOUS
  Filled 2023-04-22 (×14): qty 50

## 2023-04-22 MED ORDER — LEVETIRACETAM 100 MG/ML PO SOLN
750.0000 mg | Freq: Two times a day (BID) | ORAL | Status: DC
  Administered 2023-04-22 – 2023-05-01 (×18): 750 mg
  Filled 2023-04-22 (×19): qty 7.5

## 2023-04-22 MED ORDER — POLYETHYLENE GLYCOL 3350 17 G PO PACK
17.0000 g | PACK | Freq: Two times a day (BID) | ORAL | Status: DC
  Administered 2023-04-22 (×2): 17 g
  Filled 2023-04-22: qty 1

## 2023-04-22 MED ORDER — SODIUM CHLORIDE 0.9 % IV SOLN
500.0000 mg | INTRAVENOUS | Status: DC
  Filled 2023-04-22: qty 5

## 2023-04-22 MED ORDER — IPRATROPIUM-ALBUTEROL 0.5-2.5 (3) MG/3ML IN SOLN
3.0000 mL | Freq: Three times a day (TID) | RESPIRATORY_TRACT | Status: DC
  Administered 2023-04-22 – 2023-05-01 (×27): 3 mL via RESPIRATORY_TRACT
  Filled 2023-04-22 (×26): qty 3

## 2023-04-22 NOTE — Progress Notes (Signed)
   04/22/23 1145  Assess: MEWS Score  Temp (!) 102.8 F (39.3 C)  BP 136/82  MAP (mmHg) 98  Pulse Rate (!) 116  ECG Heart Rate (!) 116  Resp (!) 25  SpO2 96 %  O2 Device Tracheostomy Collar  Assess: MEWS Score  MEWS Temp 2  MEWS Systolic 0  MEWS Pulse 2  MEWS RR 1  MEWS LOC 0  MEWS Score 5  MEWS Score Color Red  Assess: if the MEWS score is Yellow or Red  Were vital signs accurate and taken at a resting state? Yes  Does the patient meet 2 or more of the SIRS criteria? Yes  Does the patient have a confirmed or suspected source of infection? Yes  MEWS guidelines implemented  Yes, red  Treat  MEWS Interventions Considered administering scheduled or prn medications/treatments as ordered  Take Vital Signs  Increase Vital Sign Frequency  Red: Q1hr x2, continue Q4hrs until patient remains green for 12hrs  Escalate  MEWS: Escalate Red: Discuss with charge nurse and notify provider. Consider notifying RRT. If remains red for 2 hours consider need for higher level of care  Notify: Charge Nurse/RN  Name of Charge Nurse/RN Notified Acupuncturist  Provider Notification  Provider Name/Title Alford [name]  Date Provider Notified 04/22/23  Time Provider Notified 1145  Method of Notification Page  Notification Reason Other (Comment) (Temp 102.34f, thus triggering Red MEWS)  Provider response See new orders  Date of Provider Response 04/22/23  Time of Provider Response 1146  Notify: Rapid Response  Name of Rapid Response RN Notified Sarah  Date Rapid Response Notified 04/22/23  Time Rapid Response Notified 1155  Assess: SIRS CRITERIA  SIRS Temperature  1  SIRS Respirations  1  SIRS Pulse 1  SIRS WBC 0  SIRS Score Sum  3

## 2023-04-22 NOTE — Assessment & Plan Note (Signed)
Hypotension yesterday has resolved.

## 2023-04-22 NOTE — Progress Notes (Signed)
Progress Note   Patient: Billy Cherry ZOX:[id number] DOB: June 11, 1970 DOA: 04/18/2023     4 DOS: the patient was seen and examined on 04/22/2023   Brief hospital course: 53 y.o. 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   Patient is nonverbal.  Per her wife at the bedside, patient has increased shortness of breath with increased tracheal secretion in the past several days.  No fever or chills.  Patient has increased fluid retention, particularly in arms per her wife.  Patient does not see to have chest pain, nausea, vomiting, abdominal pain per his wife.  Patient was found to have oxygen desaturation to 88% on room air, which improved to 99% of 4L -6L oxygen in ED.    Data reviewed independently and ED Course: pt was found to have BNP 349, potassium 5.8, GFR> 60, WBC 17.1, negative PCR for flu, COVID and RSV, troponin 13 --> 15, procalcitonin 0.17.  Chest x-ray showed bilateral basilar opacity (the left is worse than the right).  Temperature 99.1, blood pressure 125/100, heart rate of 108, RR 22.  Patient is admitted to PCU as inpatient.  2/14.  Patient on 40% trach collar.  Patient on IV Lasix to remove fluid and IV antibiotics for pneumonia. 2/15.  Creatinine went up to 1.6 will decrease Lasix down to 20 mg IV twice daily.  Switch tube feedings over to Glucerna that he takes at home. Dietitian to adjust rate to goal and free water. 2/16.  Creatinine up at 1.66.  Will hold Lasix for the rest of today and recheck creatinine tomorrow.  Try to get patient down to his baseline oxygen.  Echocardiogram shows an EF of 30 to 35% stable from previous. 2/17.  Creatinine up at 1.85.  Patient spiked a fever of 102.  Will get chest x-ray blood cultures and change antibiotics to Zithromax  and Zosyn.  Assessment and Plan: * Multifocal pneumonia Patient initially placed on vancomycin Maxipime and Flagyl.  Since MRSA PCR is negative, vancomycin was discontinued.  Initial sputum culture grew Proteus and providencia.   Patient had a fever of 102 on 2/17.  Will switch antibiotics to Zosyn and Zithromax and recheck blood cultures and sputum culture.  Repeat chest x-ray.  Acute on chronic systolic CHF (congestive heart failure) ([name]) Give another fluid bolus today with acute kidney injury.  Patient on tube feedings.  Restarted Coreg.  Repeat echo showed a stable EF of 30 to 35%.  Weight on 2/13 was 263 pounds, today's weight is 233.47 pounds.    Acute kidney injury superimposed on CKD ([name]) AKI on CKD stage II.  Creatinine up to 1.85 with diuresis.  Will hold diuresis today again today.  Needed a fluid bolus yesterday and will give another fluid bolus again today.  Acute on chronic hypoxic respiratory failure ([name]) Patient normally wears 6 L.  1 pulse ox of 88%.  Currently on 40% Ventimask with 6 L flow.  Hyperkalemia Improved  Seizure disorder ([name]) Continue Keppra  Essential hypertension Coreg held last night with hypotension.  Blood pressure better today will restart Coreg and increase back to his usual dose this evening.  Pulmonary embolus ([name]) On Eliquis  DVT (deep venous thrombosis) ([name]) On Eliquis  S/P percutaneous endoscopic gastrostomy (PEG) tube placement ([name]) Continue Glucerna tube feedings.  Tracheostomy dependent ([name]) On  40% oxygen  Arterial hypotension Hypotension yesterday has resolved.  Iron deficiency anemia Ferritin 53 and last hemoglobin 9.9.  Anoxic encephalopathy ([name]) Supportive care        Subjective: Patient nonverbal secondary to anoxic encephalopathy.  Patient spiked a fever after I saw him of 102.  Will repeat blood cultures change antibiotics over to Zosyn and Zithromax.  Wife noticed sputum more thick today.  Physical  Exam: Vitals:   04/22/23 0400 04/22/23 0500 04/22/23 0736 04/22/23 1145  BP: 121/86  115/79 136/82  Pulse: (!) 108  (!) 108 (!) 116  Resp: (!) 30  (!) 30 (!) 25  Temp: 99.6 F (37.6 C)  98.8 F (37.1 C) (!) 102.8 F (39.3 C)  TempSrc: Axillary  Axillary Oral  SpO2: 99%  99% 96%  Weight:  105.9 kg    Height:       Physical Exam HENT:     Head: Normocephalic.  Eyes:     General: Lids are normal.  Cardiovascular:     Rate and Rhythm: Normal rate and regular rhythm.     Heart sounds: Normal heart sounds, S1 normal and S2 normal.  Pulmonary:     Breath sounds: Examination of the right-lower field reveals decreased breath sounds. Examination of the left-lower field reveals decreased breath sounds. Decreased breath sounds present. No wheezing, rhonchi or rales.  Abdominal:     Palpations: Abdomen is soft.     Tenderness: There is no abdominal tenderness.  Musculoskeletal:     Right ankle: Swelling present.     Left ankle: Swelling present.  Skin:    General: Skin is warm.     Findings: No rash.  Neurological:     Mental Status: He is alert.     Data Reviewed: Creatinine 1.85, potassium 4.7, hemoglobin 9.9  Family Communication: Spoke with patient's wife at the bedside  Disposition: Status is: Inpatient Remains inpatient appropriate because: Spiked a fever of 102 today.  Continue IV antibiotics.  Reculture blood and sputum.  Change antibiotics to Zosyn and Zithromax.  Planned Discharge Destination: Home with Home Health    Time spent: 28 minutes  Author: Alford [name], MD 04/22/2023 12:17 PM  For on call review [email or web address].

## 2023-04-22 NOTE — TOC Initial Note (Signed)
Transition of Care [facility]) - Initial/Assessment Note    Patient Details  Name: Billy Cherry MRN: [id number] Date of Birth: 11/26/1970  Transition of Care [facility]) CM/SW Contact:    Truddie Hidden, RN Phone Number: 04/22/2023, 4:23 PM  Clinical Narrative:                 Risk assessment completed by wife.   Admitted for: CHF  Admitted from: Home with spouse  PCP: [facility]  [facility]  Current home health/prior home health/DME: HH: [facility], [place] @ [phone] and [facility] [place] @ [phone] Transportation: Will need EMS transport at discharge    Expected Discharge Plan: Home w Home Health Services Barriers to Discharge: Continued Medical Work up   Patient Goals and CMS Choice Patient states their goals for this hospitalization and ongoing recovery are:: Return home CMS Medicare.gov Compare Post Acute Care list provided to:: Patient Represenative (must comment)        Expected Discharge Plan and Services     Post Acute Care Choice: Skilled Nursing Facility Living arrangements for the past 2 months: Single Family Home                                      Prior Living Arrangements/Services Living arrangements for the past 2 months: Single Family Home Lives with:: Spouse Patient language and need for interpreter reviewed:: No Do you feel safe going back to the place where you live?: Yes      Need for Family Participation in Patient Care: Yes (Comment) Care giver support system in place?: Yes (comment) Current home services: Home RN (via [facility] and [employer]) Criminal Activity/Legal Involvement Pertinent to Current Situation/Hospitalization: No - Comment as needed  Activities of Daily Living      Permission Sought/Granted                  Emotional Assessment Appearance:: Other (Comment Required (Completed by spouse)       Alcohol / Substance Use: Not Applicable Psych Involvement: No (comment)  Admission diagnosis:   Hyperkalemia [E87.5] Acute on chronic systolic CHF (congestive heart failure) ([name]) [I50.23] Pneumonia due to infectious organism, unspecified laterality, unspecified part of lung [J18.9] Acute on chronic congestive heart failure, unspecified heart failure type ([name]) [I50.9] Sepsis, due to unspecified organism, unspecified whether acute organ dysfunction present [facility]) [A41.9] Patient Active Problem List   Diagnosis Date Noted   Iron deficiency anemia 04/21/2023   Arterial hypotension 04/21/2023   Acute kidney injury superimposed on CKD ([name]) 04/20/2023   Acute on chronic systolic CHF (congestive heart failure) ([name]) 04/18/2023   Hyperkalemia 04/18/2023   CAD (coronary artery disease) 04/18/2023   Essential hypertension 05/30/2022   Anxiety 05/30/2022   Seizure disorder ([name]) 05/30/2022   GERD without esophagitis 05/30/2022   Acute tracheitis 05/18/2022   Acute tracheobronchitis 05/17/2022   Acute on chronic hypoxic respiratory failure ([name]) 05/17/2022   Gastroenteritis 01/09/2022   Severe sepsis ([name]) 01/06/2022   Pressure injury of buttock, stage 2 ([name]) 10/17/2021   Pressure injury of right heel, stage 1 10/17/2021   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   S/P percutaneous endoscopic gastrostomy (PEG) tube placement ([name]) 10/04/2021   Obesity (BMI 30-39.9) 08/16/2021   Pulmonary embolus ([name]) 06/06/2021   Pressure injury of skin 05/25/2021   DVT (deep venous thrombosis) ([name]) 05/16/2021   Dysphagia 05/01/2021   HTN (hypertension) 04/24/2021  Sepsis ([name]) 04/20/2021   Multifocal pneumonia 04/19/2021   Chronic systolic CHF (congestive heart failure) ([name]) 04/05/2021   Anoxic encephalopathy ([name]) 04/03/2021   Myoclonus 03/30/2021   Chronic respiratory failure with hypoxia [facility])    History of cardiac arrest 03/23/2021   PCP:  [facility]   [facility] [address] [place] [facility] [address] [place] [address] Phone:  [phone] Fax: [phone]  [facility] [address] [place] [address] Phone: [phone] Fax: [phone]     Social Drivers of Health (SDOH) Social History: SDOH Screenings   Food Insecurity: Patient Declined (04/19/2023)  Housing: Patient Unable To Answer (04/19/2023)  Transportation Needs: Patient Unable To Answer (04/19/2023)  Utilities: Patient Unable To Answer (04/19/2023)  Financial Resource Strain: High Risk (07/04/2022)   Received from [facility]  Tobacco Use: Low Risk  (12/27/2022)   SDOH Interventions:     Readmission Risk Interventions    04/22/2023    4:20 PM 05/31/2022    2:30 PM 01/08/2022    3:18 PM  Readmission Risk Prevention Plan  Transportation Screening Complete Complete Complete  PCP or Specialist Appt within 3-5 Days Complete Complete Complete  HRI or Home Care Consult Complete Complete Complete  Social Work Consult for Recovery Care Planning/Counseling Complete Complete Complete  Palliative Care Screening Not Applicable Not Applicable Not Applicable  Medication Review Oceanographer) Complete Complete Complete

## 2023-04-22 NOTE — Plan of Care (Signed)

## 2023-04-22 NOTE — Consult Note (Signed)
Pharmacy Antibiotic Note  Billy Cherry is a 53 y.o. male admitted on 04/18/2023 with  aspiration pneumonia .  Pharmacy has been consulted for Zosyn dosing.  Plan: Zosyn 3.375g IV q8h (4 hour infusion).  Height: 5\' 11"  (180.3 cm) Weight: 105.9 kg (233 lb 7.5 oz) IBW/kg (Calculated) : 75.3  Temp (24hrs), Avg:99.8 F (37.7 C), Min:98.8 F (37.1 C), Max:102.8 F (39.3 C)  Recent Labs  Lab 04/18/23 1734 04/18/23 1900 04/18/23 2032 04/19/23 0455 04/20/23 0500 04/21/23 0352 04/22/23 0616  WBC 17.0*  --   --  11.4* 7.9 9.9  --   CREATININE 1.26*  --   --  1.38* 1.60* 1.66* 1.85*  LATICACIDVEN  --  2.0* 1.9  --   --   --   --     Estimated Creatinine Clearance: 57.8 mL/min (A) (by C-G formula based on SCr of 1.85 mg/dL (H)).    Allergies  Allergen Reactions   Lisinopril Swelling    angioedema  Other Reaction(s): angioedema    Antimicrobials this admission: Cefepime 2/14>>2/17 Azithromycin 2/17 X 3 days Zosyn 2/17 >>   Dose adjustments this admission: n/a  Microbiology results: 2/13 BCx: NGTD  Billy Cherry PharmD, BCPS 04/22/2023 12:13 PM

## 2023-04-22 NOTE — Progress Notes (Signed)
Nutrition Follow-up  DOCUMENTATION CODES:   Not applicable  INTERVENTION:   TF via PEG:   Glucerna 1.5 @ 65 ml/hr  200 ml free water flush every 6 hours  Tube feeding regimen provides 2340 kcal (100% of needs), 128 grams of protein, and 1184 ml of H2O.  Total free water: 1984 ml daily   NUTRITION DIAGNOSIS:   Inadequate oral intake related to dysphagia as evidenced by NPO status.  Ongoing  GOAL:   Patient will meet greater than or equal to 90% of their needs  Met with TF  MONITOR:   Weight trends, TF tolerance, Labs, I & O's  REASON FOR ASSESSMENT:   Consult Enteral/tube feeding initiation and management  ASSESSMENT:   53 y.o admitted for SOB from CHF exacerbation. PMH of anoxic brain injury, non-verbal, bedbound, s/p Trach and PEG tube placement, CHF, HTN, DVT, HTN, DM, CKD II, seizure disorder.  Reviewed I/O's: +1.2 L x 24 hours and +1.1 L since admission  UOP: 600 ml x 24 hours  Pt in bed at time of visit. Pt did not interact with this RD. No family at bedside.   Pt NPO and receives sole source nutrition via PEG. Glucerna 1.5 infusing at goal rate of 65 ml/hr. Pt tolerating well.   Medications reviewed and include keppra, reglan, and miralax.   Labs reviewed: CBGS: 156-157 (inpatient orders for glycemic control are 0-9 units insulin aspart every 4 hours and 10 units insulin glargine-yfgn daily at bedtime).    NUTRITION - FOCUSED PHYSICAL EXAM:  Flowsheet Row Most Recent Value  Orbital Region No depletion  Upper Arm Region No depletion  Thoracic and Lumbar Region No depletion  Buccal Region No depletion  Temple Region No depletion  Clavicle Bone Region No depletion  Clavicle and Acromion Bone Region No depletion  Scapular Bone Region No depletion  Dorsal Hand No depletion  Patellar Region No depletion  Anterior Thigh Region No depletion  Posterior Calf Region No depletion  Edema (RD Assessment) Moderate  Hair Reviewed  Eyes Reviewed  Mouth  Reviewed  Skin Reviewed  Nails Reviewed       Diet Order:   Diet Order             Diet NPO time specified  Diet effective now                   EDUCATION NEEDS:   No education needs have been identified at this time  Skin:  Skin Assessment: Reviewed RN Assessment  Last BM:  04/21/23 (type 6)  Height:   Ht Readings from Last 1 Encounters:  04/18/23 5\' 11"  (1.803 m)    Weight:   Wt Readings from Last 1 Encounters:  04/22/23 105.9 kg    Ideal Body Weight:  78.2 kg  BMI:  Body mass index is 32.56 kg/m.  Estimated Nutritional Needs:   Kcal:  2300-2500 kcal  Protein:  110-125 gm  Fluid:  >2L per day    Levada Schilling, RD, LDN, CDCES Registered Dietitian III Certified Diabetes Care and Education Specialist If unable to reach this RD, please use "RD Inpatient" group chat on secure chat between hours of 8am-4 pm daily

## 2023-04-23 ENCOUNTER — Ambulatory Visit: Payer: BC Managed Care – PPO

## 2023-04-23 DIAGNOSIS — J189 Pneumonia, unspecified organism: Secondary | ICD-10-CM | POA: Diagnosis not present

## 2023-04-23 DIAGNOSIS — N179 Acute kidney failure, unspecified: Secondary | ICD-10-CM | POA: Diagnosis not present

## 2023-04-23 DIAGNOSIS — I5023 Acute on chronic systolic (congestive) heart failure: Secondary | ICD-10-CM | POA: Diagnosis not present

## 2023-04-23 DIAGNOSIS — E875 Hyperkalemia: Secondary | ICD-10-CM | POA: Diagnosis not present

## 2023-04-23 LAB — CBC WITH DIFFERENTIAL/PLATELET
Abs Immature Granulocytes: 0.07 10*3/uL (ref 0.00–0.07)
Basophils Absolute: 0 10*3/uL (ref 0.0–0.1)
Basophils Relative: 0 %
Eosinophils Absolute: 0.1 10*3/uL (ref 0.0–0.5)
Eosinophils Relative: 1 %
HCT: 32.3 % — ABNORMAL LOW (ref 39.0–52.0)
Hemoglobin: 9.7 g/dL — ABNORMAL LOW (ref 13.0–17.0)
Immature Granulocytes: 1 %
Lymphocytes Relative: 18 %
Lymphs Abs: 1.7 10*3/uL (ref 0.7–4.0)
MCH: 32.7 pg (ref 26.0–34.0)
MCHC: 30 g/dL (ref 30.0–36.0)
MCV: 108.8 fL — ABNORMAL HIGH (ref 80.0–100.0)
Monocytes Absolute: 2.1 10*3/uL — ABNORMAL HIGH (ref 0.1–1.0)
Monocytes Relative: 22 %
Neutro Abs: 5.3 10*3/uL (ref 1.7–7.7)
Neutrophils Relative %: 58 %
Platelets: 166 10*3/uL (ref 150–400)
RBC: 2.97 MIL/uL — ABNORMAL LOW (ref 4.22–5.81)
RDW: 15.3 % (ref 11.5–15.5)
WBC: 9.2 10*3/uL (ref 4.0–10.5)
nRBC: 0 % (ref 0.0–0.2)

## 2023-04-23 LAB — BASIC METABOLIC PANEL
Anion gap: 11 (ref 5–15)
BUN: 84 mg/dL — ABNORMAL HIGH (ref 6–20)
CO2: 29 mmol/L (ref 22–32)
Calcium: 8.5 mg/dL — ABNORMAL LOW (ref 8.9–10.3)
Chloride: 106 mmol/L (ref 98–111)
Creatinine, Ser: 2.22 mg/dL — ABNORMAL HIGH (ref 0.61–1.24)
GFR, Estimated: 35 mL/min — ABNORMAL LOW (ref >60)
Glucose, Bld: 179 mg/dL — ABNORMAL HIGH (ref 70–99)
Potassium: 5 mmol/L (ref 3.5–5.1)
Sodium: 146 mmol/L — ABNORMAL HIGH (ref 135–145)

## 2023-04-23 LAB — CULTURE, BLOOD (ROUTINE X 2)
Culture: NO GROWTH
Culture: NO GROWTH
Special Requests: ADEQUATE

## 2023-04-23 LAB — GLUCOSE, CAPILLARY
Glucose-Capillary: 184 mg/dL — ABNORMAL HIGH (ref 70–99)
Glucose-Capillary: 192 mg/dL — ABNORMAL HIGH (ref 70–99)
Glucose-Capillary: 207 mg/dL — ABNORMAL HIGH (ref 70–99)
Glucose-Capillary: 204 mg/dL — ABNORMAL HIGH (ref 70–99)
Glucose-Capillary: 168 mg/dL — ABNORMAL HIGH (ref 70–99)

## 2023-04-23 MED ORDER — SODIUM CHLORIDE 0.9 % IV SOLN: INTRAVENOUS | Status: DC

## 2023-04-23 MED ORDER — POLYETHYLENE GLYCOL 3350 17 G PO PACK
17.0000 g | PACK | Freq: Every day | ORAL | Status: DC
  Administered 2023-04-24: 17 g
  Filled 2023-04-23: qty 1

## 2023-04-23 MED ORDER — VANCOMYCIN HCL IN DEXTROSE 1-5 GM/200ML-% IV SOLN
1000.0000 mg | INTRAVENOUS | Status: DC
  Filled 2023-04-23: qty 200

## 2023-04-23 MED ORDER — OXYCODONE HCL 5 MG/5ML PO SOLN
2.5000 mg | Freq: Three times a day (TID) | ORAL | Status: DC
  Administered 2023-04-23 – 2023-05-01 (×24): 2.5 mg
  Filled 2023-04-23 (×24): qty 5

## 2023-04-23 MED ORDER — VANCOMYCIN HCL 2000 MG/400ML IV SOLN
2000.0000 mg | Freq: Once | INTRAVENOUS | Status: AC
  Administered 2023-04-23: 2000 mg via INTRAVENOUS
  Filled 2023-04-23: qty 400

## 2023-04-23 NOTE — Progress Notes (Signed)
Progress Note   Patient: Billy Cherry VWU:[id number] DOB: 1971-02-14 DOA: 04/18/2023     5 DOS: the patient was seen and examined on 04/23/2023   Brief hospital course: 53 y.o. 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   Patient is nonverbal.  Per her wife at the bedside, patient has increased shortness of breath with increased tracheal secretion in the past several days.  No fever or chills.  Patient has increased fluid retention, particularly in arms per her wife.  Patient does not see to have chest pain, nausea, vomiting, abdominal pain per his wife.  Patient was found to have oxygen desaturation to 88% on room air, which improved to 99% of 4L -6L oxygen in ED.    Data reviewed independently and ED Course: pt was found to have BNP 349, potassium 5.8, GFR> 60, WBC 17.1, negative PCR for flu, COVID and RSV, troponin 13 --> 15, procalcitonin 0.17.  Chest x-ray showed bilateral basilar opacity (the left is worse than the right).  Temperature 99.1, blood pressure 125/100, heart rate of 108, RR 22.  Patient is admitted to PCU as inpatient.  2/14.  Patient on 40% trach collar.  Patient on IV Lasix to remove fluid and IV antibiotics for pneumonia. 2/15.  Creatinine went up to 1.6 will decrease Lasix down to 20 mg IV twice daily.  Switch tube feedings over to Glucerna that he takes at home. Dietitian to adjust rate to goal and free water. 2/16.  Creatinine up at 1.66.  Will hold Lasix for the rest of today and recheck creatinine tomorrow.  Try to get patient down to his baseline oxygen.  Echocardiogram shows an EF of 30 to 35% stable from previous. 2/17.  Creatinine up at 1.85.  Patient spiked a fever of 102.  Will get blood cultures and change antibiotics to Zithromax and Zosyn.   Chest x-ray showing left lower lobe pneumonia.  Repeat COVID RSV and influenza negative. 2/18.  Creatinine up at 2.22.  Maintenance fluids started.  Added vancomycin.  Assessment and Plan: * Multifocal pneumonia Patient initially placed on vancomycin Maxipime and Flagyl.  Since MRSA PCR is negative, vancomycin was discontinued.  Initial sputum culture grew Proteus and providencia.   Patient had a fever of 102 on 2/17.  Antibiotics switched to Zosyn, Zithromax and added vancomycin back.  Blood cultures so far negative.  Repeat COVID influenza and RSV negative.  Sputum culture growing gram-positive cocci in pairs and gram-negative rods.  Acute on chronic systolic CHF (congestive heart failure) ([name]) Gentle fluids started with worsening kidney function.  Patient on tube feedings.  Continue Coreg.  Repeat echo showed a stable EF of 30 to 35%.  Weight on 2/13 was 263 pounds and not sure how accurate that weight is.  Weight on 2/16 was 232.37.  Today's weight 245.15.  Continue to monitor.  Acute kidney injury superimposed on CKD ([name]) AKI on CKD stage II.  Creatinine up to 2.22 with diuresis.  Continue to hold diuresis.  Patient on gentle fluids.  Acute on chronic hypoxic respiratory failure ([name]) Patient normally wears 6 L.  1 pulse ox of 88%.  Currently on 40% Ventimask with 6 L flow.  Hyperkalemia Improved  Seizure disorder ([name]) Continue Keppra and Depakote.  Will get an EEG.  Essential hypertension Continue Coreg  Pulmonary embolus (  [name]) On Eliquis  DVT (deep venous thrombosis) ([name]) On Eliquis  S/P percutaneous endoscopic gastrostomy (PEG) tube placement ([name]) Continue Glucerna tube feedings.  Tracheostomy dependent ([name]) On 40% oxygen, 6 L flow.  Arterial hypotension Hypotension resolved  Iron deficiency anemia Ferritin 53 and last hemoglobin 9.7.  Anoxic encephalopathy ([name]) Supportive care        Subjective: Patient with history of anoxic encephalopathy unable  to give any history did moan a little bit with stimulation.  Wife thinks he may be a little bit of pain so I ordered some pain medication.  Creatinine more elevated today.  More aggressive with antibiotics since patient had a fever of 102 yesterday.  Physical Exam: Vitals:   04/23/23 0307 04/23/23 0500 04/23/23 0800 04/23/23 1207  BP: 120/88     Pulse: (!) 109     Resp: 20     Temp: (!) 101.3 F (38.5 C)  99.8 F (37.7 C) 99.4 F (37.4 C)  TempSrc: Axillary   Oral  SpO2: 98%     Weight:  111.2 kg    Height:       Physical Exam HENT:     Head: Normocephalic.  Eyes:     General: Lids are normal.  Cardiovascular:     Rate and Rhythm: Normal rate and regular rhythm.     Heart sounds: Normal heart sounds, S1 normal and S2 normal.  Pulmonary:     Breath sounds: Examination of the right-lower field reveals decreased breath sounds. Examination of the left-lower field reveals decreased breath sounds. Decreased breath sounds present. No wheezing, rhonchi or rales.  Abdominal:     Palpations: Abdomen is soft.     Tenderness: There is no abdominal tenderness.  Musculoskeletal:     Right ankle: Swelling present.     Left ankle: Swelling present.  Skin:    General: Skin is warm.     Findings: No rash.  Neurological:     Mental Status: He is alert.     Data Reviewed: Repeat blood cultures drawn on 2/17 negative for less than 12 hours Repeat sputum culture on 217 shows gram-negative rods and gram-positive cocci in pairs Repeat COVID influenza and RSV negative on 2/17  Previous sputum culture grows Providencia and Proteus and previous blood cultures negative for 5 days  Sodium 146, creatinine 2.22, white blood count 9.2, platelet count 166, hemoglobin 9.7  Family Communication: Wife at bedside  Disposition: Status is: Inpatient Remains inpatient appropriate because: Since patient spiked a fever of 102 on 2/17 antibiotics changed and recultured.  Planned Discharge Destination:  Home with Home Health    Time spent: 28 minutes  Author: Alford [name], MD 04/23/2023 1:50 PM  For on call review [email or web address].

## 2023-04-23 NOTE — Plan of Care (Signed)
  Problem: Fluid Volume: Goal: Ability to maintain a balanced intake and output will improve Outcome: Progressing   Problem: Education: Goal: Ability to describe self-care measures that may prevent or decrease complications (Diabetes Survival Skills Education) will improve Outcome: Not Progressing   Problem: Coping: Goal: Ability to adjust to condition or change in health will improve Outcome: Not Progressing   Problem: Metabolic: Goal: Ability to maintain appropriate glucose levels will improve Outcome: Not Progressing   Problem: Nutritional: Goal: Progress toward achieving an optimal weight will improve Outcome: Not Progressing   Problem: Skin Integrity: Goal: Risk for impaired skin integrity will decrease Outcome: Not Progressing   Problem: Education: Goal: Knowledge of General Education information will improve Description: Including pain rating scale, medication(s)/side effects and non-pharmacologic comfort measures Outcome: Not Progressing

## 2023-04-23 NOTE — Plan of Care (Signed)
   Problem: Education: Goal: Ability to describe self-care measures that may prevent or decrease complications (Diabetes Survival Skills Education) will improve Outcome: Progressing Goal: Individualized Educational Video(s) Outcome: Progressing   Problem: Coping: Goal: Ability to adjust to condition or change in health will improve Outcome: Progressing   Problem: Fluid Volume: Goal: Ability to maintain a balanced intake and output will improve Outcome: Progressing   Problem: Health Behavior/Discharge Planning: Goal: Ability to identify and utilize available resources and services will improve Outcome: Progressing Goal: Ability to manage health-related needs will improve Outcome: Progressing   Problem: Metabolic: Goal: Ability to maintain appropriate glucose levels will improve Outcome: Progressing   Problem: Nutritional: Goal: Maintenance of adequate nutrition will improve Outcome: Progressing Goal: Progress toward achieving an optimal weight will improve Outcome: Progressing   Problem: Skin Integrity: Goal: Risk for impaired skin integrity will decrease Outcome: Progressing   Problem: Tissue Perfusion: Goal: Adequacy of tissue perfusion will improve Outcome: Progressing   Problem: Education: Goal: Knowledge of General Education information will improve Description: Including pain rating scale, medication(s)/side effects and non-pharmacologic comfort measures Outcome: Progressing   Problem: Health Behavior/Discharge Planning: Goal: Ability to manage health-related needs will improve Outcome: Progressing   Problem: Clinical Measurements: Goal: Ability to maintain clinical measurements within normal limits will improve Outcome: Progressing Goal: Will remain free from infection Outcome: Progressing Goal: Diagnostic test results will improve Outcome: Progressing Goal: Respiratory complications will improve Outcome: Progressing Goal: Cardiovascular complication will  be avoided Outcome: Progressing   Problem: Activity: Goal: Risk for activity intolerance will decrease Outcome: Progressing   Problem: Nutrition: Goal: Adequate nutrition will be maintained Outcome: Progressing   Problem: Coping: Goal: Level of anxiety will decrease Outcome: Progressing   Problem: Elimination: Goal: Will not experience complications related to bowel motility Outcome: Progressing Goal: Will not experience complications related to urinary retention Outcome: Progressing   Problem: Pain Managment: Goal: General experience of comfort will improve and/or be controlled Outcome: Progressing   Problem: Safety: Goal: Ability to remain free from injury will improve Outcome: Progressing   Problem: Skin Integrity: Goal: Risk for impaired skin integrity will decrease Outcome: Progressing   Problem: Education: Goal: Ability to demonstrate management of disease process will improve Outcome: Progressing Goal: Ability to verbalize understanding of medication therapies will improve Outcome: Progressing Goal: Individualized Educational Video(s) Outcome: Progressing   Problem: Activity: Goal: Capacity to carry out activities will improve Outcome: Progressing   Problem: Cardiac: Goal: Ability to achieve and maintain adequate cardiopulmonary perfusion will improve Outcome: Progressing   Problem: Activity: Goal: Ability to tolerate increased activity will improve Outcome: Progressing   Problem: Clinical Measurements: Goal: Ability to maintain a body temperature in the normal range will improve Outcome: Progressing   Problem: Respiratory: Goal: Ability to maintain adequate ventilation will improve Outcome: Progressing Goal: Ability to maintain a clear airway will improve Outcome: Progressing

## 2023-04-23 NOTE — Procedures (Signed)
Pt will be done tomorrow-he has to be done portably.

## 2023-04-23 NOTE — Consult Note (Signed)
Pharmacy Antibiotic Note  Billy Cherry is a 53 y.o. male admitted on 04/18/2023 with  aspiration pneumonia .  Pharmacy has been consulted for Zosyn and vancomycin dosing.  Plan: Continue Zosyn 3.375g IV q8h (4 hour infusion). Vancomycin 2000 mg IV now, then Vancomycin 1000 mg IV Q 24 hrs.  Goal AUC 400-550 Expected AUC: 463.4 Expected Cmin: 12.2 SCr used: 2.22   Height: 5\' 11"  (180.3 cm) Weight: 111.2 kg (245 lb 2.4 oz) IBW/kg (Calculated) : 75.3  Temp (24hrs), Avg:100.9 F (38.3 C), Min:99.5 F (37.5 C), Max:102.8 F (39.3 C)  Recent Labs  Lab 04/18/23 1734 04/18/23 1900 04/18/23 2032 04/19/23 0455 04/20/23 0500 04/21/23 0352 04/22/23 0616 04/23/23 0556  WBC 17.0*  --   --  11.4* 7.9 9.9  --  9.2  CREATININE 1.26*  --   --  1.38* 1.60* 1.66* 1.85* 2.22*  LATICACIDVEN  --  2.0* 1.9  --   --   --   --   --     Estimated Creatinine Clearance: 49.4 mL/min (A) (by C-G formula based on SCr of 2.22 mg/dL (H)).    Allergies  Allergen Reactions   Lisinopril Swelling    angioedema  Other Reaction(s): angioedema    Antimicrobials this admission: Cefepime 2/14>>2/17 Azithromycin 2/17 X 3 days Zosyn 2/17 >>  Vancomycin 2/18>>  Dose adjustments this admission: n/a  Microbiology results: 2/13 BCx: NGTD  Miakoda Mcmillion Rodriguez-Guzman PharmD, BCPS 04/23/2023 9:29 AM

## 2023-04-24 ENCOUNTER — Inpatient Hospital Stay: Payer: Medicaid Other

## 2023-04-24 ENCOUNTER — Ambulatory Visit: Payer: BC Managed Care – PPO

## 2023-04-24 DIAGNOSIS — N179 Acute kidney failure, unspecified: Secondary | ICD-10-CM | POA: Diagnosis not present

## 2023-04-24 DIAGNOSIS — I824Y9 Acute embolism and thrombosis of unspecified deep veins of unspecified proximal lower extremity: Secondary | ICD-10-CM

## 2023-04-24 DIAGNOSIS — R569 Unspecified convulsions: Secondary | ICD-10-CM | POA: Diagnosis not present

## 2023-04-24 DIAGNOSIS — J690 Pneumonitis due to inhalation of food and vomit: Secondary | ICD-10-CM

## 2023-04-24 DIAGNOSIS — I5023 Acute on chronic systolic (congestive) heart failure: Secondary | ICD-10-CM | POA: Diagnosis not present

## 2023-04-24 DIAGNOSIS — E875 Hyperkalemia: Secondary | ICD-10-CM | POA: Diagnosis not present

## 2023-04-24 DIAGNOSIS — J189 Pneumonia, unspecified organism: Secondary | ICD-10-CM | POA: Diagnosis not present

## 2023-04-24 DIAGNOSIS — R0602 Shortness of breath: Secondary | ICD-10-CM

## 2023-04-24 DIAGNOSIS — R509 Fever, unspecified: Secondary | ICD-10-CM | POA: Diagnosis not present

## 2023-04-24 LAB — CBC
HCT: 30.4 % — ABNORMAL LOW (ref 39.0–52.0)
Hemoglobin: 9.1 g/dL — ABNORMAL LOW (ref 13.0–17.0)
MCH: 32.7 pg (ref 26.0–34.0)
MCHC: 29.9 g/dL — ABNORMAL LOW (ref 30.0–36.0)
MCV: 109.4 fL — ABNORMAL HIGH (ref 80.0–100.0)
Platelets: 159 10*3/uL (ref 150–400)
RBC: 2.78 MIL/uL — ABNORMAL LOW (ref 4.22–5.81)
RDW: 15.2 % (ref 11.5–15.5)
WBC: 10.1 10*3/uL (ref 4.0–10.5)
nRBC: 0 % (ref 0.0–0.2)

## 2023-04-24 LAB — URINALYSIS, COMPLETE (UACMP) WITH MICROSCOPIC
Bilirubin Urine: NEGATIVE
Glucose, UA: NEGATIVE mg/dL
Ketones, ur: NEGATIVE mg/dL
Leukocytes,Ua: NEGATIVE
Nitrite: NEGATIVE
Protein, ur: 100 mg/dL — AB
Specific Gravity, Urine: 1.02 (ref 1.005–1.030)
Squamous Epithelial / HPF: 0 /[HPF] (ref 0–5)
pH: 5 (ref 5.0–8.0)

## 2023-04-24 LAB — BASIC METABOLIC PANEL
Anion gap: 12 (ref 5–15)
BUN: 96 mg/dL — ABNORMAL HIGH (ref 6–20)
CO2: 27 mmol/L (ref 22–32)
Calcium: 8.3 mg/dL — ABNORMAL LOW (ref 8.9–10.3)
Chloride: 105 mmol/L (ref 98–111)
Creatinine, Ser: 2.91 mg/dL — ABNORMAL HIGH (ref 0.61–1.24)
GFR, Estimated: 25 mL/min — ABNORMAL LOW (ref >60)
Glucose, Bld: 196 mg/dL — ABNORMAL HIGH (ref 70–99)
Potassium: 5.1 mmol/L (ref 3.5–5.1)
Sodium: 144 mmol/L (ref 135–145)

## 2023-04-24 LAB — GLUCOSE, CAPILLARY
Glucose-Capillary: 137 mg/dL — ABNORMAL HIGH (ref 70–99)
Glucose-Capillary: 176 mg/dL — ABNORMAL HIGH (ref 70–99)
Glucose-Capillary: 177 mg/dL — ABNORMAL HIGH (ref 70–99)
Glucose-Capillary: 192 mg/dL — ABNORMAL HIGH (ref 70–99)
Glucose-Capillary: 197 mg/dL — ABNORMAL HIGH (ref 70–99)
Glucose-Capillary: 202 mg/dL — ABNORMAL HIGH (ref 70–99)

## 2023-04-24 LAB — VALPROIC ACID LEVEL: Valproic Acid Lvl: 83 ug/mL (ref 50.0–100.0)

## 2023-04-24 MED ORDER — JUVEN PO PACK
1.0000 | PACK | Freq: Two times a day (BID) | ORAL | Status: DC
  Administered 2023-04-24 – 2023-05-01 (×14): 1

## 2023-04-24 MED ORDER — IOHEXOL 300 MG/ML  SOLN
30.0000 mL | Freq: Once | INTRAMUSCULAR | Status: DC | PRN
Start: 2023-04-24 — End: 2023-05-01

## 2023-04-24 MED ORDER — SODIUM CHLORIDE 0.9 % IV SOLN
500.0000 mg | INTRAVENOUS | Status: AC
  Administered 2023-04-24: 500 mg via INTRAVENOUS
  Filled 2023-04-24: qty 5

## 2023-04-24 NOTE — Consult Note (Signed)
NAME: Billy Cherry  DOB: 06-07-1970  MRN: [id number]  Date/Time: 04/24/2023 1:22 PM  REQUESTING PROVIDER: Dr.Amin Subjective:  REASON FOR CONSULT: fever ?No history available from patient Chart reviewed completley Spoke to wife Billy Cherry is a 53 y.o. male with a history of anoxic brain injury following at home cardiac arrest in Jan 2023, complicated by prolonged hospital stay, tracheostomy, peg, vegetative state, PE, DVT  On 03/23/21 pt was brought from home after wife found him with agonal breathing, he was in Vfib and EMS defibrillated and out of hospital CPR done with ROSC, ST changes, taken to cath lab and no significant CAD, he was intubated , remained unresponsive with myoclonic jerks transferred to [facility] for continuous EEF on 03/31/23, anoxic brain injury, tracheostomy, PEG placement, was having low grade fever in the hospital, treated many times for HCAP, had DVT ( left lower extremity veins),PE placed on eliquis and after multiple failed attenpts to send him to a facility he was finally DC home on 10/24/21 11/3-11/21/23 admitted to [facility] for aspiration pneumonia, tracheitis ( pseudomonaas,  A few more admissions and ED presentaiton in 2024 for same Was seen in Trach clinic on 04/09/23 Presented to the ED on 04/18/23 from home with shortnes sof breath ,sats of 88% increased tracheal secretions. Fever te previous days  04/18/23 15:27  BP 123/102 (H)  Temp 98.9 F (37.2 C)  Pulse Rate 105 !  Resp 20  SpO2 99 %     Latest Reference Range & Units 04/18/23 17:34  WBC 4.0 - 10.5 K/uL 17.0 (H)  Hemoglobin 13.0 - 17.0 g/dL 9.8 (L)  HCT 56.4 - 33.2 % 32.4 (L)  Platelets 150 - 400 K/uL 179  Creatinine 0.61 - 1.24 mg/dL 9.51 (H)  CXR on admisison low lung volume, cardiomegaly and left lower lobe infiltrate VS atelectasisBlood culture sent Diagnosed with possible HCAP and started on vanco/cefepime and flagyl Concern for aspiration as well   He was started on zosyn/vanco on 2/17 I am  asked to se epatient for fever Past Medical History:  Diagnosis Date   Anoxic brain injury ([name])    Hypertension     Past Surgical History:  Procedure Laterality Date   BACK SURGERY     CORONARY/GRAFT ACUTE MI REVASCULARIZATION N/A 03/23/2021   Procedure: Coronary/Graft Acute MI Revascularization;  Surgeon: Marcina Millard, MD;  Location: [facility] INVASIVE CV LAB;  Service: Cardiovascular;  Laterality: N/A;   IR GASTROSTOMY TUBE MOD SED  04/26/2021   LEFT HEART CATH AND CORONARY ANGIOGRAPHY N/A 03/23/2021   Procedure: LEFT HEART CATH AND CORONARY ANGIOGRAPHY;  Surgeon: Marcina Millard, MD;  Location: [facility] INVASIVE CV LAB;  Service: Cardiovascular;  Laterality: N/A;   SHOULDER SURGERY      Social History   Socioeconomic History   Marital status: Married    Spouse name: Not on file   Number of children: Not on file   Years of education: Not on file   Highest education level: Not on file  Occupational History   Not on file  Tobacco Use   Smoking status: Never   Smokeless tobacco: Never  Vaping Use   Vaping status: Unknown  Substance and Sexual Activity   Alcohol use: Yes   Drug use: Never   Sexual activity: Never  Other Topics Concern   Not on file  Social History Narrative   Not on file   Social Drivers of Health   Financial Resource Strain: High Risk (07/04/2022)   Received from [name]   Overall Financial Resource Strain (CARDIA)    Difficulty of Paying Living Expenses: Hard  Food Insecurity: Patient Declined (04/19/2023)   Hunger Vital Sign    Worried About Running Out of Food in the Last Year: Patient declined    Ran Out of Food in the Last Year: Patient declined  Transportation Needs: Patient Unable To Answer (04/19/2023)   PRAPARE - Administrator, Civil Service (Medical): Patient unable to answer    Lack of Transportation (Non-Medical): Patient unable to answer  Physical Activity: Not on file  Stress: Not on file  Social  Connections: Not on file  Intimate Partner Violence: Unknown (04/19/2023)   Humiliation, Afraid, Rape, and Kick questionnaire    Fear of Current or Ex-Partner: Patient unable to answer    Emotionally Abused: Patient unable to answer    Physically Abused: Not on file    Sexually Abused: Patient unable to answer    No family history on file. Allergies  Allergen Reactions   Lisinopril Swelling    angioedema  Other Reaction(s): angioedema   I? Current Facility-Administered Medications  Medication Dose Route Frequency Provider Last Rate Last Admin   0.9 %  sodium chloride infusion   Intravenous Continuous Alford [name], MD 50 mL/hr at 04/24/23 1300 Infusion Verify at 04/24/23 1300   acetaminophen (TYLENOL) 160 MG/5ML solution 650 mg  650 mg Per Tube Q6H PRN Lorretta Harp, MD   650 mg at 04/24/23 0428   albuterol (PROVENTIL) (2.5 MG/3ML) 0.083% nebulizer solution 2.5 mg  2.5 mg Nebulization Q4H PRN Lorretta Harp, MD       apixaban Everlene Balls) tablet 5 mg  5 mg Per Tube BID Lorretta Harp, MD   5 mg at 04/24/23 0910   atropine 1 % ophthalmic solution 2 drop  2 drop Sublingual QID Lorretta Harp, MD   2 drop at 04/24/23 0911   azithromycin (ZITHROMAX) 500 mg in sodium chloride 0.9 % 250 mL IVPB  500 mg Intravenous Q24H [name], MD       budesonide (PULMICORT) nebulizer solution 0.25 mg  0.25 mg Nebulization BID Renae Gloss, Richard, MD   0.25 mg at 04/24/23 0755   carvedilol (COREG) tablet 6.25 mg  6.25 mg Per Tube BID WC Alford [name], MD   6.25 mg at 04/24/23 0910   clonazePAM (KLONOPIN) tablet 0.5 mg  0.5 mg Per Tube BID Lorretta Harp, MD   0.5 mg at 04/24/23 0910   famotidine (PEPCID) tablet 20 mg  20 mg Per Tube BID Lorretta Harp, MD   20 mg at 04/24/23 0910   feeding supplement (GLUCERNA 1.5 CAL) liquid 1,000 mL  1,000 mL Per Tube Continuous Alford [name], MD 65 mL/hr at 04/24/23 1300 Infusion Verify at 04/24/23 1300   free water 200 mL  200 mL Per Tube Q6H Lorretta Harp, MD   200 mL at 04/24/23 1140    glycopyrrolate (ROBINUL) tablet 1 mg  1 mg Per Tube TID Lorretta Harp, MD   1 mg at 04/24/23 0909   guaiFENesin (ROBITUSSIN) 100 MG/5ML liquid 200 mg  200 mg Per Tube Q4H PRN Lorretta Harp, MD   200 mg at 04/23/23 1638   insulin aspart (novoLOG) injection 0-9 Units  0-9 Units Subcutaneous Q4H Manuela Schwartz, NP   2 Units at 04/24/23 1140   insulin glargine-yfgn (SEMGLEE) injection 10 Units  10 Units Subcutaneous QHS Lorretta Harp, MD   10 Units at 04/23/23 2117   ipratropium-albuterol (DUONEB) 0.5-2.5 (3) MG/3ML nebulizer solution 3 mL  3 mL Nebulization TID Alford [name], MD   3 mL at 04/24/23 0755   levETIRAcetam (KEPPRA) 100 MG/ML solution 750 mg  750 mg Per Tube BID Alford [name], MD   750 mg at 04/24/23 0909   LORazepam (ATIVAN) injection 2 mg  2 mg Intravenous Q2H PRN Lorretta Harp, MD       metoCLOPramide (REGLAN) 10 MG/10ML solution 10 mg  10 mg Per Tube Alean Rinne, MD   10 mg at 04/24/23 0543   nutrition supplement (JUVEN) (JUVEN) powder packet 1 packet  1 packet Per Tube BID BM [name], MD       ondansetron (ZOFRAN) injection 4 mg  4 mg Intravenous Q8H PRN Lorretta Harp, MD       oxyCODONE (ROXICODONE) 5 MG/5ML solution 2.5 mg  2.5 mg Per Tube Q8H Wieting, Richard, MD   2.5 mg at 04/24/23 0909   piperacillin-tazobactam (ZOSYN) IVPB 3.375 g  3.375 g Intravenous Q8H Alford [name], MD   Stopped at 04/24/23 6962   polyethylene glycol (MIRALAX / GLYCOLAX) packet 17 g  17 g Per Tube Daily Alford [name], MD   17 g at 04/24/23 0910   valproic acid (DEPAKENE) 250 MG/5ML solution 750 mg  750 mg Per Tube TID Lorretta Harp, MD   750 mg at 04/24/23 0911     Abtx:  Anti-infectives (From admission, onward)    Start     Dose/Rate Route Frequency Ordered Stop   04/24/23 2000  azithromycin (ZITHROMAX) 500 mg in sodium chloride 0.9 % 250 mL IVPB        500 mg 250 mL/hr over 60 Minutes Intravenous Every 24 hours 04/24/23 1055 04/25/23 1959   04/24/23 1200  vancomycin (VANCOCIN) IVPB 1000 mg/200 mL  premix  Status:  Discontinued        1,000 mg 200 mL/hr over 60 Minutes Intravenous Every 24 hours 04/23/23 1141 04/24/23 1041   04/23/23 1030  vancomycin (VANCOREADY) IVPB 2000 mg/400 mL        2,000 mg 200 mL/hr over 120 Minutes Intravenous  Once 04/23/23 0928 04/23/23 1445   04/22/23 1400  azithromycin (ZITHROMAX) 500 mg in sodium chloride 0.9 % 250 mL IVPB  Status:  Discontinued        500 mg 250 mL/hr over 60 Minutes Intravenous Every 24 hours 04/22/23 1149 04/22/23 1237   04/22/23 1400  piperacillin-tazobactam (ZOSYN) IVPB 3.375 g        3.375 g 12.5 mL/hr over 240 Minutes Intravenous Every 8 hours 04/22/23 1206     04/22/23 1400  azithromycin (ZITHROMAX) 500 mg in sodium chloride 0.9 % 250 mL IVPB  Status:  Discontinued        500 mg 255 mL/hr over 60 Minutes Intravenous Every 24 hours 04/22/23 1242 04/24/23 1055   04/19/23 1200  vancomycin (VANCOCIN) IVPB 1000 mg/200 mL premix  Status:  Discontinued        1,000 mg 200 mL/hr over 60 Minutes Intravenous Every 12 hours 04/19/23 0149 04/19/23 1010   04/19/23 0200  ceFEPIme (MAXIPIME) 2 g in sodium chloride 0.9 % 100 mL IVPB  Status:  Discontinued        2 g 200 mL/hr over 30 Minutes Intravenous Every 8 hours 04/19/23 0010 04/22/23 1148   04/19/23 0030  metroNIDAZOLE (FLAGYL) IVPB 500 mg  Status:  Discontinued        500 mg 100 mL/hr over 60 Minutes Intravenous Every 12 hours 04/19/23 0004 04/22/23 1148   04/19/23 0015  vancomycin (  VANCOCIN) IVPB 1000 mg/200 mL premix        1,000 mg 200 mL/hr over 60 Minutes Intravenous  Once 04/19/23 0008 04/19/23 0140   04/18/23 1845  vancomycin (VANCOCIN) IVPB 1000 mg/200 mL premix        1,000 mg 200 mL/hr over 60 Minutes Intravenous  Once 04/18/23 1842 04/18/23 2101   04/18/23 1845  ceFEPIme (MAXIPIME) 2 g in sodium chloride 0.9 % 100 mL IVPB        2 g 200 mL/hr over 30 Minutes Intravenous  Once 04/18/23 1842 04/18/23 2002       REVIEW OF SYSTEMS:  nA Objective:  VITALS:  BP (!)  113/99 (BP Location: Left Arm)   Pulse 100   Temp 99.4 F (37.4 C) (Axillary)   Resp (!) 26   Ht 5\' 11"  (1.803 m)   Wt 112.7 kg   SpO2 97%   BMI 34.65 kg/m  LDA Foley Central line Other drainage tubes PHYSICAL EXAM:  Generaeyes open, does not track, does not follow commands, non verbal Head: Normocephalic, without obvious abnormality, atraumatic. B/l parotid fullness, but no erythema  or warmth Eyes: Conjunctivae clear, anicteric sclerae. Pupils are equal ENT tracheostomy connected to oxygen Neck:, symmetrical, no adenopathy, thyroid: non tender no carotid bruit and no JVD. Lungs: b/l air entry- decreased bases Heart: Tachycardia. Abdomen: Soft, peg Extremities: edema ankles Lymph: Cervical, supraclavicular normal. Neurologic: canmot assess Pertinent Labs Lab Results CBC    Component Value Date/Time   WBC 10.1 04/24/2023 0845   RBC 2.78 (L) 04/24/2023 0845   HGB 9.1 (L) 04/24/2023 0845   HGB 13.3 11/10/2021 1615   HCT 30.4 (L) 04/24/2023 0845   HCT 40.4 11/10/2021 1615   PLT 159 04/24/2023 0845   PLT 183 11/10/2021 1615   MCV 109.4 (H) 04/24/2023 0845   MCV 96 11/10/2021 1615   MCH 32.7 04/24/2023 0845   MCHC 29.9 (L) 04/24/2023 0845   RDW 15.2 04/24/2023 0845   RDW 13.2 11/10/2021 1615   LYMPHSABS 1.7 04/23/2023 0556   LYMPHSABS 1.6 11/10/2021 1615   MONOABS 2.1 (H) 04/23/2023 0556   EOSABS 0.1 04/23/2023 0556   EOSABS 0.1 11/10/2021 1615   BASOSABS 0.0 04/23/2023 0556   BASOSABS 0.0 11/10/2021 1615       Latest Ref Rng & Units 04/24/2023    8:45 AM 04/23/2023    5:56 AM 04/22/2023    6:16 AM  CMP  Glucose 70 - 99 mg/dL 161  096  045   BUN 6 - 20 mg/dL 96  84  75   Creatinine 0.61 - 1.24 mg/dL 4.09  8.11  9.14   Sodium 135 - 145 mmol/L 144  146  144   Potassium 3.5 - 5.1 mmol/L 5.1  5.0  4.7   Chloride 98 - 111 mmol/L 105  106  104   CO2 22 - 32 mmol/L 27  29  30    Calcium 8.9 - 10.3 mg/dL 8.3  8.5  8.6       Microbiology: Recent Results (from  the past 240 hours)  Resp panel by RT-PCR (RSV, Flu A&B, Covid) Anterior Nasal Swab     Status: None   Collection Time: 04/18/23  3:44 PM   Specimen: Anterior Nasal Swab  Result Value Ref Range Status   SARS Coronavirus 2 by RT PCR NEGATIVE NEGATIVE Final    Comment: (NOTE) SARS-CoV-2 target nucleic acids are NOT DETECTED.  The SARS-CoV-2 RNA is generally detectable in upper respiratory specimens during  the acute phase of infection. The lowest concentration of SARS-CoV-2 viral copies this assay can detect is 138 copies/mL. A negative result does not preclude SARS-Cov-2 infection and should not be used as the sole basis for treatment or other patient management decisions. A negative result may occur with  improper specimen collection/handling, submission of specimen other than nasopharyngeal swab, presence of viral mutation(s) within the areas targeted by this assay, and inadequate number of viral copies(<138 copies/mL). A negative result must be combined with clinical observations, patient history, and epidemiological information. The expected result is Negative.  Fact Sheet for Patients:  BloggerCourse.com  Fact Sheet for Healthcare Providers:  SeriousBroker.it  This test is no t yet approved or cleared by the [place] FDA and  has been authorized for detection and/or diagnosis of SARS-CoV-2 by FDA under an Emergency Use Authorization (EUA). This EUA will remain  in effect (meaning this test can be used) for the duration of the COVID-19 declaration under Section 564(b)(1) of the Act, 21 U.S.C.section 360bbb-3(b)(1), unless the authorization is terminated  or revoked sooner.       Influenza A by PCR NEGATIVE NEGATIVE Final   Influenza B by PCR NEGATIVE NEGATIVE Final    Comment: (NOTE) The Xpert Xpress SARS-CoV-2/FLU/RSV plus assay is intended as an aid in the diagnosis of influenza from Nasopharyngeal swab specimens  and should not be used as a sole basis for treatment. Nasal washings and aspirates are unacceptable for Xpert Xpress SARS-CoV-2/FLU/RSV testing.  Fact Sheet for Patients: BloggerCourse.com  Fact Sheet for Healthcare Providers: SeriousBroker.it  This test is not yet approved or cleared by the [place] FDA and has been authorized for detection and/or diagnosis of SARS-CoV-2 by FDA under an Emergency Use Authorization (EUA). This EUA will remain in effect (meaning this test can be used) for the duration of the COVID-19 declaration under Section 564(b)(1) of the Act, 21 U.S.C. section 360bbb-3(b)(1), unless the authorization is terminated or revoked.     Resp Syncytial Virus by PCR NEGATIVE NEGATIVE Final    Comment: (NOTE) Fact Sheet for Patients: BloggerCourse.com  Fact Sheet for Healthcare Providers: SeriousBroker.it  This test is not yet approved or cleared by the [place] FDA and has been authorized for detection and/or diagnosis of SARS-CoV-2 by FDA under an Emergency Use Authorization (EUA). This EUA will remain in effect (meaning this test can be used) for the duration of the COVID-19 declaration under Section 564(b)(1) of the Act, 21 U.S.C. section 360bbb-3(b)(1), unless the authorization is terminated or revoked.  Performed at [facility], [address] [place], [place] [address]   Blood culture (routine x 2)     Status: None   Collection Time: 04/18/23  5:30 PM   Specimen: BLOOD  Result Value Ref Range Status   Specimen Description BLOOD BLOOD LEFT FOREARM  Final   Special Requests   Final    BOTTLES DRAWN AEROBIC AND ANAEROBIC Blood Culture results may not be optimal due to an inadequate volume of blood received in culture bottles   Culture   Final    NO GROWTH 5 DAYS Performed at [facility], [address], [place], [place]  [address]    Report Status 04/23/2023 FINAL  Final  Blood culture (routine x 2)     Status: None   Collection Time: 04/18/23  7:42 PM   Specimen: BLOOD  Result Value Ref Range Status   Specimen Description BLOOD BLOOD RIGHT HAND  Final   Special Requests  Final    BOTTLES DRAWN AEROBIC AND ANAEROBIC Blood Culture adequate volume   Culture   Final    NO GROWTH 5 DAYS Performed at [facility], [address] [place], [place] [address]    Report Status 04/23/2023 FINAL  Final  Expectorated Sputum Assessment w Gram Stain, Rflx to Resp Cult     Status: None   Collection Time: 04/18/23  8:32 PM   Specimen: Sputum  Result Value Ref Range Status   Specimen Description SPUTUM  Final   Special Requests NONE  Final   Sputum evaluation   Final    THIS SPECIMEN IS ACCEPTABLE FOR SPUTUM CULTURE Performed at [facility], [address], [place], [place] [address]    Report Status 04/18/2023 FINAL  Final  Culture, Respiratory w Gram Stain     Status: None   Collection Time: 04/18/23  8:32 PM   Specimen: SPU  Result Value Ref Range Status   Specimen Description   Final    SPUTUM Performed at [facility], [address], [place], [place] [address]    Special Requests   Final    NONE Reflexed from [id number] Performed at [facility], [address] [place], [place] [address]    Gram Stain   Final    FEW SQUAMOUS EPITHELIAL CELLS PRESENT FEW WBC PRESENT,BOTH PMN AND MONONUCLEAR FEW YEAST FEW GRAM NEGATIVE RODS FEW GRAM POSITIVE RODS FEW GRAM POSITIVE COCCI IN PAIRS Performed at [facility], [address], [place], [place] [address]    Culture   Final    ABUNDANT PROVIDENCIA RETTGERI ABUNDANT PROTEUS MIRABILIS    Report Status 04/21/2023 FINAL  Final   Organism ID, Bacteria PROVIDENCIA RETTGERI  Final   Organism ID, Bacteria PROTEUS MIRABILIS  Final      Susceptibility   Proteus mirabilis - MIC*    AMPICILLIN <=2 SENSITIVE Sensitive      CEFEPIME <=0.12 SENSITIVE Sensitive     CEFTAZIDIME <=1 SENSITIVE Sensitive     CEFTRIAXONE <=0.25 SENSITIVE Sensitive     CIPROFLOXACIN <=0.25 SENSITIVE Sensitive     GENTAMICIN <=1 SENSITIVE Sensitive     IMIPENEM 2 SENSITIVE Sensitive     TRIMETH/SULFA <=20 SENSITIVE Sensitive     AMPICILLIN/SULBACTAM <=2 SENSITIVE Sensitive     PIP/TAZO <=4 SENSITIVE Sensitive ug/mL    * ABUNDANT PROTEUS MIRABILIS   Providencia rettgeri - MIC*    AMPICILLIN >=32 RESISTANT Resistant     CEFEPIME <=0.12 SENSITIVE Sensitive     CEFTAZIDIME <=1 SENSITIVE Sensitive     CEFTRIAXONE <=0.25 SENSITIVE Sensitive     CIPROFLOXACIN <=0.25 SENSITIVE Sensitive     GENTAMICIN <=1 SENSITIVE Sensitive     IMIPENEM 2 SENSITIVE Sensitive     TRIMETH/SULFA <=20 SENSITIVE Sensitive     AMPICILLIN/SULBACTAM 16 INTERMEDIATE Intermediate     PIP/TAZO <=4 SENSITIVE Sensitive ug/mL    * ABUNDANT PROVIDENCIA RETTGERI  MRSA Next Gen by PCR, Nasal     Status: None   Collection Time: 04/19/23 12:52 AM   Specimen: Nasal Mucosa; Nasal Swab  Result Value Ref Range Status   MRSA by PCR Next Gen NOT DETECTED NOT DETECTED Final    Comment: (NOTE) The GeneXpert MRSA Assay (FDA approved for NASAL specimens only), is one component of a comprehensive MRSA colonization surveillance program. It is not intended to diagnose MRSA infection nor to guide or monitor treatment for MRSA infections. Test performance is not FDA approved in patients less than 2 years  old. Performed at [facility], [address] [place], [place] [address]   Culture, blood (Routine X 2) w Reflex to ID Panel     Status: None (Preliminary result)   Collection Time: 04/22/23 12:27 PM   Specimen: BLOOD  Result Value Ref Range Status   Specimen Description BLOOD BLOOD LEFT HAND  Final   Special Requests   Final    BOTTLES DRAWN AEROBIC AND ANAEROBIC Blood Culture adequate volume   Culture   Final    NO GROWTH 2 DAYS Performed at [facility], [address] [place], [place] [address]    Report Status PENDING  Incomplete  Culture, blood (Routine X 2) w Reflex to ID Panel     Status: None (Preliminary result)   Collection Time: 04/22/23 12:37 PM   Specimen: BLOOD  Result Value Ref Range Status   Specimen Description BLOOD BLOOD RIGHT HAND  Final   Special Requests   Final    BOTTLES DRAWN AEROBIC AND ANAEROBIC Blood Culture adequate volume   Culture   Final    NO GROWTH 2 DAYS Performed at [facility], [address], [place], [place] [address]    Report Status PENDING  Incomplete  Culture, Respiratory w Gram Stain     Status: None (Preliminary result)   Collection Time: 04/22/23  1:22 PM   Specimen: Tracheal Aspirate  Result Value Ref Range Status   Specimen Description   Final    TRACHEAL ASPIRATE Performed at [facility], [address] [place], [place] [address]    Special Requests   Final    NONE Performed at [facility], [address] [place], [place] [address]    Gram Stain   Final    ABUNDANT SQUAMOUS EPITHELIAL CELLS PRESENT ABUNDANT WBC PRESENT, PREDOMINANTLY PMN FEW GRAM NEGATIVE RODS RARE GRAM POSITIVE COCCI IN PAIRS    Culture   Final    TOO YOUNG TO READ Performed at [facility], [address], [place], [place] [address]    Report Status PENDING  Incomplete  Resp panel by RT-PCR (RSV, Flu A&B, Covid) Anterior Nasal Swab     Status: None   Collection Time: 04/22/23  5:53 PM   Specimen: Anterior Nasal Swab  Result Value Ref Range Status   SARS Coronavirus 2 by RT PCR NEGATIVE NEGATIVE Final    Comment: (NOTE) SARS-CoV-2 target nucleic acids are NOT DETECTED.  The SARS-CoV-2 RNA is generally detectable in upper respiratory specimens during the acute phase of infection. The lowest concentration of SARS-CoV-2 viral copies this assay can detect is 138 copies/mL. A negative result does not preclude SARS-Cov-2 infection and should not be used  as the sole basis for treatment or other patient management decisions. A negative result may occur with  improper specimen collection/handling, submission of specimen other than nasopharyngeal swab, presence of viral mutation(s) within the areas targeted by this assay, and inadequate number of viral copies(<138 copies/mL). A negative result must be combined with clinical observations, patient history, and epidemiological information. The expected result is Negative.  Fact Sheet for Patients:  BloggerCourse.com  Fact Sheet for Healthcare Providers:  SeriousBroker.it  This test is no t yet approved or cleared by the [place] FDA and  has been authorized for detection and/or diagnosis of SARS-CoV-2 by FDA under an Emergency Use Authorization (EUA). This EUA will remain  in effect (meaning this test can be used) for the duration of the COVID-19 declaration under Section  564(b)(1) of the Act, 21 U.S.C.section 360bbb-3(b)(1), unless the authorization is terminated  or revoked sooner.       Influenza A by PCR NEGATIVE NEGATIVE Final   Influenza B by PCR NEGATIVE NEGATIVE Final    Comment: (NOTE) The Xpert Xpress SARS-CoV-2/FLU/RSV plus assay is intended as an aid in the diagnosis of influenza from Nasopharyngeal swab specimens and should not be used as a sole basis for treatment. Nasal washings and aspirates are unacceptable for Xpert Xpress SARS-CoV-2/FLU/RSV testing.  Fact Sheet for Patients: BloggerCourse.com  Fact Sheet for Healthcare Providers: SeriousBroker.it  This test is not yet approved or cleared by the [place] FDA and has been authorized for detection and/or diagnosis of SARS-CoV-2 by FDA under an Emergency Use Authorization (EUA). This EUA will remain in effect (meaning this test can be used) for the duration of the COVID-19 declaration under Section 564(b)(1)  of the Act, 21 U.S.C. section 360bbb-3(b)(1), unless the authorization is terminated or revoked.     Resp Syncytial Virus by PCR NEGATIVE NEGATIVE Final    Comment: (NOTE) Fact Sheet for Patients: BloggerCourse.com  Fact Sheet for Healthcare Providers: SeriousBroker.it  This test is not yet approved or cleared by the [place] FDA and has been authorized for detection and/or diagnosis of SARS-CoV-2 by FDA under an Emergency Use Authorization (EUA). This EUA will remain in effect (meaning this test can be used) for the duration of the COVID-19 declaration under Section 564(b)(1) of the Act, 21 U.S.C. section 360bbb-3(b)(1), unless the authorization is terminated or revoked.  Performed at [facility], [address] [place], [place] [address]     IMAGING RESULTS:  I have personally reviewed the films ?left lower lobe infltrate??  Cardiomegaly  Impression/Recommendation 53 y.o. male with a history of anoxic brain injury following at home cardiac arrest in Jan 2023, complicated by prolonged hospital stay, tracheostomy, peg, vegetative state, PE, DVT  presents with fever , sob and increased secretions  Fever lung or bladder source  Pneumonia related to not clearing secretions and having tracheostomy and being colonized with multiple gram neg organisms- now has providencia and proteus Aspiration pneumonia  a concern Cxr questions left lower consolidation Chect CT to look for mucus plugs   Pt is on zosyn and azithro ? Worsening AKI- could be from diuretics but need to Check bladder scan for incomplete bladder emptying Do US kidney/bladder or get CT abdomen to look at kidneys  Anemia Cardiomegaly with chf 2 d echo shows EF 30-35% LV global hypokinesis ? ?Anoxic encephalopathy  H/o DVT /PE Pt is on miralax every day and is having large liquid stools along with tube feeds- recommend holding miralax   _I  have personally spent  -75--minutes involved in face-to-face and non-face-to-face activities for this patient on the day of the visit. Professional time spent includes the following activities: Preparing to see the patient (review of tests), Obtaining and/or reviewing separately obtained history (admission/discharge record), Performing a medically appropriate examination and/or evaluation , Ordering medications/tests/procedures, referring and communicating with other health care professionals, Documenting clinical information in the EMR, Independently interpreting results (not separately reported), Communicating results to the patient/family/caregiver, Counseling and educating the patient/family/caregiver and Care coordination (not separately reported).    ________________________________________________ Discussed with Dr.Amin, his nurse and patient's wife Note:  This document was prepared using Dragon voice recognition software and may include unintentional dictation errors.

## 2023-04-24 NOTE — Progress Notes (Signed)
Progress Note   Patient: Billy Cherry HYQ:[id number] DOB: 06-27-1970 DOA: 04/18/2023     6 DOS: the patient was seen and examined on 04/24/2023   Brief hospital course: 53 y.o. 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   Patient is nonverbal.  Per her wife at the bedside, patient has increased shortness of breath with increased tracheal secretion in the past several days.  No fever or chills.  Patient has increased fluid retention, particularly in arms per her wife.  Patient does not see to have chest pain, nausea, vomiting, abdominal pain per his wife.  Patient was found to have oxygen desaturation to 88% on room air, which improved to 99% of 4L -6L oxygen in ED.    Data reviewed independently and ED Course: pt was found to have BNP 349, potassium 5.8, GFR> 60, WBC 17.1, negative PCR for flu, COVID and RSV, troponin 13 --> 15, procalcitonin 0.17.  Chest x-ray showed bilateral basilar opacity (the left is worse than the right).  Temperature 99.1, blood pressure 125/100, heart rate of 108, RR 22.  Patient is admitted to PCU as inpatient.  2/14.  Patient on 40% trach collar.  Patient on IV Lasix to remove fluid and IV antibiotics for pneumonia. 2/15.  Creatinine went up to 1.6 will decrease Lasix down to 20 mg IV twice daily.  Switch tube feedings over to Glucerna that he takes at home. Dietitian to adjust rate to goal and free water. 2/16.  Creatinine up at 1.66.  Will hold Lasix for the rest of today and recheck creatinine tomorrow.  Try to get patient down to his baseline oxygen.  Echocardiogram shows an EF of 30 to 35% stable from previous. 2/17.  Creatinine up at 1.85.  Patient spiked a fever of 102.  Will get blood cultures and change antibiotics to Zithromax and Zosyn.   Chest x-ray showing left lower lobe pneumonia.  Repeat COVID RSV and influenza negative. 2/18.  Creatinine up at 2.22.  Maintenance fluids started.  Added vancomycin. 2/19: Creatinine continued to get worse, currently at 2.91 with BUN of 96, IV Lasix has been discontinued and he was getting some IV fluid.  No significant volume on bladder scan.  Continue to have fever, maximum recorded at 101.8 over the past 24-hour.  MRSA PCR was negative so vancomycin is being discontinued.  Blood cultures from 12/17 remain negative.  Repeat sputum cultures pending consulting infectious disease, CT chest, abdomen and pelvis, UA with urine culture was ordered. EEG with severe diffuse encephalopathy, no seizure or definitive epileptiform discharges were noted.  Assessment and Plan: * Multifocal pneumonia Patient initially placed on vancomycin Maxipime and Flagyl.  Since MRSA PCR is negative, vancomycin was discontinued.  Initial sputum culture grew Proteus and providencia, which were treated appropriately. Patient had a fever of 102 on 2/17.  Antibiotics switched to Zosyn, Zithromax and added vancomycin back.  Blood cultures so far negative.  Repeat COVID influenza and RSV negative.  Sputum culture growing gram-positive cocci in pairs and gram-negative rods. -As he continued to have fever spikes-ID was consulted -Repeat blood cultures remain negative, sputum cultures pending.  Ordered CT chest, abdomen and pelvis, UA and urine culture. -Discontinue vancomycin due to worsening renal function -Follow-up ID recommendations -Continue with Zosyn and Zithromax for now  Acute on chronic systolic CHF (  congestive heart failure) ([name]) Gentle fluids started with worsening kidney function.  Patient on tube feedings.  Continue Coreg.  Repeat echo showed a stable EF of 30 to 35%.  Weight on 2/13 was 263 pounds and not sure how accurate that weight is.  Weight on 2/16 was 232.37.  Today's weight 248.46.  Continue to  monitor.  Acute kidney injury superimposed on CKD ([name]) AKI on CKD stage II.  Creatinine up to 2.91 with diuresis.  Continue to hold diuresis.  Patient on gentle fluids. -Monitor renal function -Avoid nephrotoxins  HCAP (healthcare-associated pneumonia)-resolved as of 04/19/2023 Patient started on Maxipime and Flagyl.  Vancomycin was discontinued with MRSA PE CR being negative  Acute on chronic hypoxic respiratory failure ([name]) Patient normally wears 6 L.  1 pulse ox of 88%.  Currently on 40% Ventimask with 6 L flow.  Hyperkalemia Improved  Seizure disorder ([name]) Continue Keppra and Depakote.  Will get an EEG.  Essential hypertension Continue Coreg  Pulmonary embolus ([name]) On Eliquis  DVT (deep venous thrombosis) ([name]) On Eliquis  S/P percutaneous endoscopic gastrostomy (PEG) tube placement ([name]) Continue Glucerna tube feedings.  Tracheostomy dependent ([name]) On 40% oxygen, 6 L flow.  Arterial hypotension Hypotension resolved  Iron deficiency anemia Ferritin 53 and last hemoglobin 9.1.  Anoxic encephalopathy ([name]) Supportive care      Subjective: Patient remained minimally responsive which is his baseline.  Unable to provide any meaningful history or following commands.  Continue to have fever.  Physical Exam: Vitals:   04/24/23 0900 04/24/23 1141 04/24/23 1200 04/24/23 1345  BP:   (!) 113/99   Pulse:   100   Resp:      Temp: (!) 100.6 F (38.1 C) 99.4 F (37.4 C)    TempSrc: Axillary Axillary    SpO2:   97% 98%  Weight:      Height:       General.  Minimally responsive gentleman in no acute distress. Pulmonary.  Harsh breath sounds bilaterally, normal respiratory effort. CV.  Regular rate and rhythm, no JVD, rub or murmur. Abdomen.  Soft, nontender, nondistended, BS positive. CNS.  Awake, not following any commands Extremities.  No edema, no cyanosis, pulses intact and symmetrical.  Data Reviewed: Prior data reviewed  Family Communication:    Disposition: Status is: Inpatient Remains inpatient appropriate because: Severity of illness  Planned Discharge Destination: Home with Home Health  DVT prophylaxis.  Eliquis Time spent: 50 minutes  This record has been created using Conservation officer, historic buildings. Errors have been sought and corrected,but may not always be located. Such creation errors do not reflect on the standard of care.   Author: [name], MD 04/24/2023 3:40 PM  For on call review [email or web address].

## 2023-04-24 NOTE — Plan of Care (Signed)
  Problem: Education: Goal: Ability to describe self-care measures that may prevent or decrease complications (Diabetes Survival Skills Education) will improve Outcome: Not Progressing Goal: Individualized Educational Video(s) Outcome: Not Progressing   Problem: Coping: Goal: Ability to adjust to condition or change in health will improve Outcome: Not Progressing   Problem: Fluid Volume: Goal: Ability to maintain a balanced intake and output will improve Outcome: Not Progressing   Problem: Health Behavior/Discharge Planning: Goal: Ability to identify and utilize available resources and services will improve Outcome: Not Progressing Goal: Ability to manage health-related needs will improve Outcome: Not Progressing   Problem: Metabolic: Goal: Ability to maintain appropriate glucose levels will improve Outcome: Not Progressing   Problem: Nutritional: Goal: Maintenance of adequate nutrition will improve Outcome: Not Progressing Goal: Progress toward achieving an optimal weight will improve Outcome: Not Progressing   Problem: Skin Integrity: Goal: Risk for impaired skin integrity will decrease Outcome: Not Progressing   Problem: Tissue Perfusion: Goal: Adequacy of tissue perfusion will improve Outcome: Not Progressing   Problem: Education: Goal: Knowledge of General Education information will improve Description: Including pain rating scale, medication(s)/side effects and non-pharmacologic comfort measures Outcome: Not Progressing   Problem: Health Behavior/Discharge Planning: Goal: Ability to manage health-related needs will improve Outcome: Not Progressing   Problem: Clinical Measurements: Goal: Ability to maintain clinical measurements within normal limits will improve Outcome: Not Progressing Goal: Will remain free from infection Outcome: Not Progressing Goal: Diagnostic test results will improve Outcome: Not Progressing Goal: Respiratory complications will  improve Outcome: Not Progressing Goal: Cardiovascular complication will be avoided Outcome: Not Progressing   Problem: Activity: Goal: Risk for activity intolerance will decrease Outcome: Not Progressing   Problem: Nutrition: Goal: Adequate nutrition will be maintained Outcome: Not Progressing   Problem: Coping: Goal: Level of anxiety will decrease Outcome: Not Progressing   Problem: Elimination: Goal: Will not experience complications related to bowel motility Outcome: Not Progressing Goal: Will not experience complications related to urinary retention Outcome: Not Progressing   Problem: Pain Managment: Goal: General experience of comfort will improve and/or be controlled Outcome: Not Progressing   Problem: Safety: Goal: Ability to remain free from injury will improve Outcome: Not Progressing   Problem: Skin Integrity: Goal: Risk for impaired skin integrity will decrease Outcome: Not Progressing   Problem: Education: Goal: Ability to demonstrate management of disease process will improve Outcome: Not Progressing Goal: Ability to verbalize understanding of medication therapies will improve Outcome: Not Progressing Goal: Individualized Educational Video(s) Outcome: Not Progressing   Problem: Activity: Goal: Capacity to carry out activities will improve Outcome: Not Progressing   Problem: Cardiac: Goal: Ability to achieve and maintain adequate cardiopulmonary perfusion will improve Outcome: Not Progressing   Problem: Activity: Goal: Ability to tolerate increased activity will improve Outcome: Not Progressing   Problem: Clinical Measurements: Goal: Ability to maintain a body temperature in the normal range will improve Outcome: Not Progressing   Problem: Respiratory: Goal: Ability to maintain adequate ventilation will improve Outcome: Not Progressing Goal: Ability to maintain a clear airway will improve Outcome: Not Progressing

## 2023-04-24 NOTE — Progress Notes (Addendum)
0730 patient non verbal unable to make needs known total care on TC 40% wife concerned with the humidification states patient doesn't have that at home RT walking in and decreased to 30% wife providing oral care and suctioning patient with home suction machine 1000 all meds given as ordered via peg tube  1100 wife called will call back Bath given after BM purwick changed 1130 I called wife back updated on morning since she left she informed me she was on the way back and that someone was oming up from a facility. 1150 Patient TOC from his Drs office arrived wanted information on patient I unformed her due to HIPAA and wife wishes we not give information that patients wife was on the way. Visitor asked that I call wife I got a call to see a patient urgently informed visitor I would give me one moment.  1155 Came back to room visitor at desk I went to call wife visitor upset I would not give information only wants to speak to charge nurse I informed visitor that HIPAA and pt break the glass she states Im rude doesn't want to speak with me that she can see the chart so I should tell her what she wants. Information is all in chart for patients dr office to see how patient is progressing if access to chart is there theres a great misunderstanding what information is needed? 1700 Patient transported to and from CT with RN tolerated well 1620 wife called updated that CT was completed and patient fever broke.

## 2023-04-24 NOTE — Progress Notes (Signed)
Mews remains red or yellow due to HR and respirations  04/24/23 0800  Assess: MEWS Score  BP (!) 123/98  MAP (mmHg) 106  Pulse Rate (!) 109  ECG Heart Rate (!) 108  Resp (!) 26  Level of Consciousness Alert  SpO2 93 %  O2 Device Tracheostomy Collar  Patient Activity (if Appropriate) In bed  FiO2 (%) 30 %  Assess: MEWS Score  MEWS Temp 2  MEWS Systolic 0  MEWS Pulse 1  MEWS RR 2  MEWS LOC 0  MEWS Score 5  MEWS Score Color Red  Assess: if the MEWS score is Yellow or Red  Were vital signs accurate and taken at a resting state? Yes  Does the patient meet 2 or more of the SIRS criteria? Yes  Does the patient have a confirmed or suspected source of infection? Yes  MEWS guidelines implemented  No, previously red, continue vital signs every 4 hours  Assess: SIRS CRITERIA  SIRS Temperature  1  SIRS Respirations  1  SIRS Pulse 1  SIRS WBC 0  SIRS Score Sum  3

## 2023-04-24 NOTE — Procedures (Signed)
Patient Name: Billy Cherry  MRN: [id number]  Epilepsy Attending: Charlsie Quest  Referring Physician/Provider: Alford [name], MD  Date: 04/24/2023 Duration: 22.13 mins  Patient history: 53yo M with h/o epilepsy getting eeg to evaluate for seizure.  Level of alertness:  comatose/ lethargic   AEDs during EEG study: LEV, VPA  Technical aspects: This EEG study was done with scalp electrodes positioned according to the 10-20 International system of electrode placement. Electrical activity was reviewed with band pass filter of 1-70Hz , sensitivity of 7 uV/mm, display speed of 22mm/sec with a 60Hz  notched filter applied as appropriate. EEG data were recorded continuously and digitally stored.  Video monitoring was available and reviewed as appropriate.  Description: EEG showed continuous generalized 3 to 5 Hz theta-delta slowing. Hyperventilation and photic stimulation were not performed.     EKG artifact was seen throughout the study  ABNORMALITY - Continuous slow, generalized  IMPRESSION: This study is suggestive of severe diffuse encephalopathy. No seizures or definite epileptiform discharges were seen throughout the recording.  Ian Cavey Annabelle Harman

## 2023-04-24 NOTE — Progress Notes (Signed)
Nutrition Follow-up  DOCUMENTATION CODES:   Not applicable  INTERVENTION:   TF via PEG:    Glucerna 1.5 @ 65 ml/hr   200 ml free water flush every 6 hours   Tube feeding regimen provides 2340 kcal (100% of needs), 128 grams of protein, and 1184 ml of H2O.  Total free water: 1984 ml daily   -1 packet Juven BID via tube, each packet provides 95 calories, 2.5 grams of protein (collagen), and 9.8 grams of carbohydrate (3 grams sugar); also contains 7 grams of L-arginine and L-glutamine, 300 mg vitamin C, 15 mg vitamin E, 1.2 mcg vitamin B-12, 9.5 mg zinc, 200 mg calcium, and 1.5 g  Calcium Beta-hydroxy-Beta-methylbutyrate to support wound healing   NUTRITION DIAGNOSIS:   Inadequate oral intake related to dysphagia as evidenced by NPO status.  Ongoing  GOAL:   Patient will meet greater than or equal to 90% of their needs  Progressing   MONITOR:   Weight trends, TF tolerance, Labs, I & O's  REASON FOR ASSESSMENT:   Consult Enteral/tube feeding initiation and management  ASSESSMENT:   53 Cherry admitted for SOB from CHF exacerbation. PMH of anoxic brain injury, non-verbal, bedbound, s/p Trach and PEG tube placement, CHF, HTN, DVT, HTN, DM, CKD II, seizure disorder.  Reviewed I/O's: +3.2 L x 24 hours and +3.8 L since admission  UOP: 490 ml x 24 hours   Pt sitting up in bed at time of visit; receiving nursing care. No family present.   Per MD notes, aggressive antibiotic regimen started yesterday due to fevers.   Pt NPO and receives sole source nutrition via PEG. Glucerna 1.5 infusing at goal rate of 65 ml/hr. Pt tolerating well.   Wt stable since last visit.   Per TOC notes, plan to return home once medically stable.   Medications reviewed and include pulmicort, klonopin, pepcid, keppra, reglan,  0.9% sodium chloride infusion @ 50 ml/hr, and miralax.  Labs reviewed: CBGS: 197 (inpatient orders for glycemic control are 0-9 units insulin aspart every 4 hours and 10 units  insulin glargine-yfgn daily).    Diet Order:   Diet Order             Diet NPO time specified  Diet effective now                   EDUCATION NEEDS:   No education needs have been identified at this time  Skin:  Skin Assessment: Reviewed RN Assessment  Last BM:  04/21/23 (type 6)  Height:   Ht Readings from Last 1 Encounters:  04/18/23 5\' Billy"  (1.803 m)    Weight:   Wt Readings from Last 1 Encounters:  04/24/23 112.7 kg    Ideal Body Weight:  78.2 kg  BMI:  Body mass index is 34.65 kg/m.  Estimated Nutritional Needs:   Kcal:  2300-2500 kcal  Protein:  110-125 gm  Fluid:  >2L per day    Levada Schilling, RD, LDN, CDCES Registered Dietitian III Certified Diabetes Care and Education Specialist If unable to reach this RD, please use "RD Inpatient" group chat on secure chat between hours of 8am-4 pm daily

## 2023-04-24 NOTE — Progress Notes (Signed)
 Eeg done

## 2023-04-24 NOTE — Plan of Care (Signed)
  Problem: Coping: Goal: Ability to adjust to condition or change in health will improve Outcome: Progressing   Problem: Fluid Volume: Goal: Ability to maintain a balanced intake and output will improve Outcome: Progressing   Problem: Nutritional: Goal: Maintenance of adequate nutrition will improve Outcome: Progressing   Problem: Nutrition: Goal: Adequate nutrition will be maintained Outcome: Progressing

## 2023-04-25 ENCOUNTER — Inpatient Hospital Stay: Payer: Medicaid Other

## 2023-04-25 DIAGNOSIS — R509 Fever, unspecified: Secondary | ICD-10-CM | POA: Diagnosis not present

## 2023-04-25 DIAGNOSIS — B9689 Other specified bacterial agents as the cause of diseases classified elsewhere: Secondary | ICD-10-CM

## 2023-04-25 DIAGNOSIS — J189 Pneumonia, unspecified organism: Secondary | ICD-10-CM | POA: Diagnosis not present

## 2023-04-25 DIAGNOSIS — E875 Hyperkalemia: Secondary | ICD-10-CM | POA: Diagnosis not present

## 2023-04-25 DIAGNOSIS — I5023 Acute on chronic systolic (congestive) heart failure: Secondary | ICD-10-CM | POA: Diagnosis not present

## 2023-04-25 DIAGNOSIS — I509 Heart failure, unspecified: Secondary | ICD-10-CM

## 2023-04-25 DIAGNOSIS — J690 Pneumonitis due to inhalation of food and vomit: Secondary | ICD-10-CM

## 2023-04-25 DIAGNOSIS — N179 Acute kidney failure, unspecified: Secondary | ICD-10-CM | POA: Diagnosis not present

## 2023-04-25 DIAGNOSIS — R0602 Shortness of breath: Secondary | ICD-10-CM | POA: Diagnosis not present

## 2023-04-25 DIAGNOSIS — B964 Proteus (mirabilis) (morganii) as the cause of diseases classified elsewhere: Secondary | ICD-10-CM

## 2023-04-25 LAB — URINE CULTURE: Culture: NO GROWTH

## 2023-04-25 LAB — BASIC METABOLIC PANEL
Anion gap: 13 (ref 5–15)
BUN: 96 mg/dL — ABNORMAL HIGH (ref 6–20)
CO2: 28 mmol/L (ref 22–32)
Calcium: 8.3 mg/dL — ABNORMAL LOW (ref 8.9–10.3)
Chloride: 102 mmol/L (ref 98–111)
Creatinine, Ser: 3.3 mg/dL — ABNORMAL HIGH (ref 0.61–1.24)
GFR, Estimated: 22 mL/min — ABNORMAL LOW (ref >60)
Glucose, Bld: 146 mg/dL — ABNORMAL HIGH (ref 70–99)
Potassium: 5.2 mmol/L — ABNORMAL HIGH (ref 3.5–5.1)
Sodium: 143 mmol/L (ref 135–145)

## 2023-04-25 LAB — GLUCOSE, CAPILLARY
Glucose-Capillary: 135 mg/dL — ABNORMAL HIGH (ref 70–99)
Glucose-Capillary: 155 mg/dL — ABNORMAL HIGH (ref 70–99)
Glucose-Capillary: 156 mg/dL — ABNORMAL HIGH (ref 70–99)
Glucose-Capillary: 161 mg/dL — ABNORMAL HIGH (ref 70–99)
Glucose-Capillary: 178 mg/dL — ABNORMAL HIGH (ref 70–99)
Glucose-Capillary: 180 mg/dL — ABNORMAL HIGH (ref 70–99)
Glucose-Capillary: 186 mg/dL — ABNORMAL HIGH (ref 70–99)

## 2023-04-25 LAB — CBC
HCT: 29.2 % — ABNORMAL LOW (ref 39.0–52.0)
Hemoglobin: 8.8 g/dL — ABNORMAL LOW (ref 13.0–17.0)
MCH: 32.8 pg (ref 26.0–34.0)
MCHC: 30.1 g/dL (ref 30.0–36.0)
MCV: 109 fL — ABNORMAL HIGH (ref 80.0–100.0)
Platelets: 160 10*3/uL (ref 150–400)
RBC: 2.68 MIL/uL — ABNORMAL LOW (ref 4.22–5.81)
RDW: 14.8 % (ref 11.5–15.5)
WBC: 10.3 10*3/uL (ref 4.0–10.5)
nRBC: 0 % (ref 0.0–0.2)

## 2023-04-25 MED ORDER — FUROSEMIDE 10 MG/ML IJ SOLN
40.0000 mg | Freq: Once | INTRAMUSCULAR | Status: AC
  Administered 2023-04-25: 40 mg via INTRAVENOUS
  Filled 2023-04-25: qty 4

## 2023-04-25 MED ORDER — POLYETHYLENE GLYCOL 3350 17 G PO PACK: 17.0000 g | PACK | Freq: Every day | ORAL | Status: DC | PRN

## 2023-04-25 NOTE — Plan of Care (Signed)
  Problem: Education: Goal: Ability to describe self-care measures that may prevent or decrease complications (Diabetes Survival Skills Education) will improve Outcome: Progressing Goal: Individualized Educational Video(s) Outcome: Progressing   Problem: Coping: Goal: Ability to adjust to condition or change in health will improve Outcome: Progressing   Problem: Fluid Volume: Goal: Ability to maintain a balanced intake and output will improve Outcome: Progressing   Problem: Tissue Perfusion: Goal: Adequacy of tissue perfusion will improve Outcome: Progressing

## 2023-04-25 NOTE — Plan of Care (Signed)
  Problem: Clinical Measurements: Goal: Ability to maintain clinical measurements within normal limits will improve Outcome: Progressing   Problem: Nutrition: Goal: Adequate nutrition will be maintained Outcome: Progressing   Problem: Elimination: Goal: Will not experience complications related to urinary retention Outcome: Progressing   Problem: Safety: Goal: Ability to remain free from injury will improve Outcome: Progressing   

## 2023-04-25 NOTE — Progress Notes (Signed)
Date of Admission:  04/18/2023      ID: Billy Cherry is a 53 y.o. male    Principal Problem:   Multifocal pneumonia Active Problems:   Anoxic encephalopathy ([name])   DVT (deep venous thrombosis) ([name])   Pulmonary embolus ([name])   Tracheostomy dependent ([name])   S/P percutaneous endoscopic gastrostomy (PEG) tube placement ([name])   Acute on chronic hypoxic respiratory failure ([name])   Essential hypertension   Seizure disorder ([name])   Acute on chronic systolic CHF (congestive heart failure) ([name])   Hyperkalemia   Acute kidney injury superimposed on CKD ([name])   Iron deficiency anemia   Arterial hypotension    Antibiotics 2/13-2/17 Cefepime 2/14-2/16 lagyl 2/13-2/14 vanco 2/17-2/19 azithro 2/18 one dose vanco 2/17-- ongoing zosyn   Subjective: None available  Medications:   apixaban  5 mg Per Tube BID   atropine  2 drop Sublingual QID   budesonide (PULMICORT) nebulizer solution  0.25 mg Nebulization BID   carvedilol  6.25 mg Per Tube BID WC   clonazePAM  0.5 mg Per Tube BID   famotidine  20 mg Per Tube BID   free water  200 mL Per Tube Q6H   glycopyrrolate  1 mg Per Tube TID   insulin aspart  0-9 Units Subcutaneous Q4H   insulin glargine-yfgn  10 Units Subcutaneous QHS   ipratropium-albuterol  3 mL Nebulization TID   levETIRAcetam  750 mg Per Tube BID   metoCLOPramide  10 mg Per Tube Q8H   nutrition supplement (JUVEN)  1 packet Per Tube BID BM   oxyCODONE  2.5 mg Per Tube Q8H   valproic acid  750 mg Per Tube TID    Objective: Vital signs in last 24 hours: Patient Vitals for the past 24 hrs:  BP Temp Temp src Pulse Resp SpO2 Weight  04/25/23 1259 (!) 135/108 97.9 F (36.6 C) Oral 99 18 (!) 85 % --  04/25/23 0753 -- 99.4 F (37.4 C) Oral -- -- -- --  04/25/23 0500 -- -- -- -- -- -- 117.2 kg  04/25/23 0400 (!) 115/92 98.5 F (36.9 C) Axillary 97 17 97 % --  04/25/23 0000 112/77 98.9 F (37.2 C) Axillary 100 20 96 % --  04/24/23 2007 -- 98.7 F (37.1 C) --  -- -- -- --  04/24/23 2000 (!) 131/103 -- Axillary 96 19 96 % --       PHYSICAL EXAM:  General: non verbal, eyes open, does not follow any commands, does not track Tracheostomy Oxygen  Lungs: b/l air entry-few rhochi  Heart: s1s2  Abdomen: Soft, peg in place  Extremities: edema less Skin: No rashes or lesions. Or bruising Lymph: Cervical, supraclavicular normal. Neurologic: cannot assess  Lab Results    Latest Ref Rng & Units 04/25/2023    6:23 AM 04/24/2023    8:45 AM 04/23/2023    5:56 AM  CBC  WBC 4.0 - 10.5 K/uL 10.3  10.1  9.2   Hemoglobin 13.0 - 17.0 g/dL 8.8  9.1  9.7   Hematocrit 39.0 - 52.0 % 29.2  30.4  32.3   Platelets 150 - 400 K/uL 160  159  166        Latest Ref Rng & Units 04/25/2023    6:23 AM 04/24/2023    8:45 AM 04/23/2023    5:56 AM  CMP  Glucose 70 - 99 mg/dL 761  607  371   BUN 6 - 20 mg/dL 96  96  84  Creatinine 0.61 - 1.24 mg/dL 5.78  4.69  6.29   Sodium 135 - 145 mmol/L 143  144  146   Potassium 3.5 - 5.1 mmol/L 5.2  5.1  5.0   Chloride 98 - 111 mmol/L 102  105  106   CO2 22 - 32 mmol/L 28  27  29    Calcium 8.9 - 10.3 mg/dL 8.3  8.3  8.5       Microbiology: 2/17 [facility] NG Studies/Results: US RENAL Result Date: 04/25/2023 CLINICAL DATA:  [id number] AKI (acute kidney injury) ([name]) 244010 EXAM: RENAL / URINARY TRACT ULTRASOUND COMPLETE COMPARISON:  April 24, 2023 FINDINGS: Evaluation is limited by inability to optimally position patient or hold breath. Right Kidney: Renal measurements: 12.5 x 6.6 x 6.9 cm = volume: 297 mL. Echogenicity is the upper limits of normal. No suspicious mass or hydronephrosis visualized. Benign cyst is incidentally noted measuring up to 2.9 cm (for which no dedicated imaging follow-up is recommended). Left Kidney: Renal measurements: 11.4 x 8.4 x 7.3 cm = volume: 363 mL. Echogenicity is the upper limits of normal. No mass or hydronephrosis visualized. Bladder: Appears normal for degree of bladder distention. Prevoid volume  108 ML. Other: None. IMPRESSION: No hydronephrosis. Electronically Signed   By: Meda Klinefelter M.D.   On: 04/25/2023 13:13   CT CHEST ABDOMEN PELVIS WO CONTRAST Result Date: 04/24/2023 CLINICAL DATA:  Sepsis EXAM: CT CHEST, ABDOMEN AND PELVIS WITHOUT CONTRAST TECHNIQUE: Multidetector CT imaging of the chest, abdomen and pelvis was performed following the standard protocol without IV contrast. RADIATION DOSE REDUCTION: This exam was performed according to the departmental dose-optimization program which includes automated exposure control, adjustment of the mA and/or kV according to patient size and/or use of iterative reconstruction technique. COMPARISON:  Chest x-ray 04/22/2023, CT 05/17/2022, 08/24/2021 FINDINGS: CT CHEST FINDINGS Cardiovascular: Limited assessment without intravenous contrast. Maximum ascending aortic diameter of 4 cm. Cardiomegaly. No pericardial effusion. Mediastinum/Nodes: Tracheostomy tube tip about 3.5 cm superior to the carina. No suspicious lymph nodes. Esophagus within normal limits Lungs/Pleura: Small bilateral pleural effusions. Hazy perihilar densities and mild septal thickening. Confluent consolidation in the left lower lobe with partial right lower lobe consolidation. Musculoskeletal: No acute osseous abnormality.  Gynecomastia CT ABDOMEN PELVIS FINDINGS Hepatobiliary: No focal liver abnormality is seen. No gallstones, gallbladder wall thickening, or biliary dilatation. Pancreas: Unremarkable. No pancreatic ductal dilatation or surrounding inflammatory changes. Spleen: Normal in size without focal abnormality. Adrenals/Urinary Tract: Adrenal glands are normal. Kidneys show no hydronephrosis. The bladder is normal Stomach/Bowel: Gastrostomy tube in the stomach. Enteral contrast in the stomach and small bowel with dilute contrast in the colon. No acute bowel wall thickening. Negative appendix. Vascular/Lymphatic: Aortic atherosclerosis. No enlarged abdominal or pelvic lymph  nodes. Reproductive: Negative prostate Other: Negative for pelvic effusion or free air Musculoskeletal: No acute or suspicious osseous abnormality IMPRESSION: 1. Cardiomegaly with small bilateral pleural effusions and mild septal thickening and bilateral perihilar ground-glass density suggesting a degree of pulmonary edema. 2. Confluent consolidation in the left lower lobe with partial right lower lobe consolidation, findings are suspicious for pneumonia or aspiration. 3. No CT evidence for acute intra-abdominal or pelvic abnormality. 4. Aortic atherosclerosis. Aortic Atherosclerosis (ICD10-I70.0). Electronically Signed   By: Jasmine Pang M.D.   On: 04/24/2023 17:42   EEG adult Result Date: 04/24/2023 Charlsie Quest, MD     04/24/2023  2:10 PM Patient Name: Bracen Schum MRN: [id number] Epilepsy Attending: Charlsie Quest Referring Physician/Provider: Alford [name], MD Date: 04/24/2023 Duration: 22.13 mins  Patient history: 53yo M with h/o epilepsy getting eeg to evaluate for seizure. Level of alertness:  comatose/ lethargic AEDs during EEG study: LEV, VPA Technical aspects: This EEG study was done with scalp electrodes positioned according to the 10-20 International system of electrode placement. Electrical activity was reviewed with band pass filter of 1-70Hz , sensitivity of 7 uV/mm, display speed of 39mm/sec with a 60Hz  notched filter applied as appropriate. EEG data were recorded continuously and digitally stored.  Video monitoring was available and reviewed as appropriate. Description: EEG showed continuous generalized 3 to 5 Hz theta-delta slowing. Hyperventilation and photic stimulation were not performed.   EKG artifact was seen throughout the study ABNORMALITY - Continuous slow, generalized IMPRESSION: This study is suggestive of severe diffuse encephalopathy. No seizures or definite epileptiform discharges were seen throughout the recording. Charlsie Quest     Assessment/Plan: 53 y.o. 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 Worsening AKI-   Checked bladder scan no significnat residue No hydronephrosis on imaging Nephrology on board- suspecting ATN, he is also on diuretics and got a few doses of vanco  Pt is on miralax every day and along with tube feeds- is having large liquid stools recommend holding miralax    Anemia Cardiomegaly with chf 2 d echo shows EF 30-35% LV global hypokinesis ? Anoxic encephalopathy   H/o DVT /PE   Discussed the management with the hospitalist and his nurse ID will sign off tomorrow if he remains afebrile

## 2023-04-25 NOTE — Consult Note (Signed)
[facility] Consult Note:04/25/23     Date of Admission:  04/18/2023           Reason for Consult: AKI    Referring Provider: [name], MD Primary Care Provider: [facility]   History of Presenting Illness:  Billy Cherry is a 53 y.o. male with medical history significant of anoxic brain injury and persistent vegetative state  V-fib arrest due to angioedema from ACE inhibitor back in Jan 2023. He is status post tracheostomy and PEG tube placement, chronic systolic CHF, hypertension, DVT on chronic anticoagulation with Eliquis, HTN, DM, CAD, seizure ds.  Nephrology consult is requested for AKI Baseline Creatinine 1.26 from 04/18/2023 All information is obtained from the chart. He was brought in by EMS when noted to be hypoxic by home health nurse.  He tested negative for flu, COVID and RSV.  He had a low-grade temperature.  He is diagnosed with bibasilar pneumonia.  Getting broad-spectrum antibiotics Zithromax and Zosyn.   Review of Systems: ROS - not available due to anoxic brain injury.  Past Medical History:  Diagnosis Date   Anoxic brain injury ([name])    Hypertension     Social History   Tobacco Use   Smoking status: Never   Smokeless tobacco: Never  Vaping Use   Vaping status: Unknown  Substance Use Topics   Alcohol use: Yes   Drug use: Never    No family history on file.   OBJECTIVE: Blood pressure (!) 135/108, pulse 99, temperature 97.9 F (36.6 C), temperature source Oral, resp. rate 18, height 5\' 11"  (1.803 m), weight 117.2 kg, SpO2 (!) 85%.  Physical Exam General Appearance-no acute distress, laying in the bed HEENT-moist oral mucous membranes Neck-trach collar present Chest-coarse breath sounds Cardiac-normal rate Abdomen-PEG tube in place, nontender, nondistended Extremities-dependent edema in upper extremities Skin-no acute rashes Neuro-did not respond to verbal or tactile stimuli.  Lab  Results Lab Results  Component Value Date   WBC 10.3 04/25/2023   HGB 8.8 (L) 04/25/2023   HCT 29.2 (L) 04/25/2023   MCV 109.0 (H) 04/25/2023   PLT 160 04/25/2023    Lab Results  Component Value Date   CREATININE 3.30 (H) 04/25/2023   BUN 96 (H) 04/25/2023   NA 143 04/25/2023   K 5.2 (H) 04/25/2023   CL 102 04/25/2023   CO2 28 04/25/2023    Lab Results  Component Value Date   ALT 25 12/27/2022   AST 36 12/27/2022   ALKPHOS 61 12/27/2022   BILITOT 0.5 12/27/2022     Microbiology: Recent Results (from the past 240 hours)  Resp panel by RT-PCR (RSV, Flu A&B, Covid) Anterior Nasal Swab     Status: None   Collection Time: 04/18/23  3:44 PM   Specimen: Anterior Nasal Swab  Result Value Ref Range Status   SARS Coronavirus 2 by RT PCR NEGATIVE NEGATIVE Final    Comment: (NOTE) SARS-CoV-2 target nucleic acids are NOT DETECTED.  The SARS-CoV-2 RNA is generally detectable in upper respiratory specimens during the acute phase of infection. The lowest concentration of SARS-CoV-2 viral copies this assay can detect is 138 copies/mL. A negative result does not preclude SARS-Cov-2 infection and should not be used as the sole basis for treatment or other patient management decisions. A negative result may occur with  improper specimen collection/handling, submission of specimen other than nasopharyngeal swab, presence of viral mutation(s) within the areas targeted by this assay, and inadequate number of viral copies(<138 copies/mL).  A negative result must be combined with clinical observations, patient history, and epidemiological information. The expected result is Negative.  Fact Sheet for Patients:  BloggerCourse.com  Fact Sheet for Healthcare Providers:  SeriousBroker.it  This test is no t yet approved or cleared by the [place] FDA and  has been authorized for detection and/or diagnosis of SARS-CoV-2 by FDA under an  Emergency Use Authorization (EUA). This EUA will remain  in effect (meaning this test can be used) for the duration of the COVID-19 declaration under Section 564(b)(1) of the Act, 21 U.S.C.section 360bbb-3(b)(1), unless the authorization is terminated  or revoked sooner.       Influenza A by PCR NEGATIVE NEGATIVE Final   Influenza B by PCR NEGATIVE NEGATIVE Final    Comment: (NOTE) The Xpert Xpress SARS-CoV-2/FLU/RSV plus assay is intended as an aid in the diagnosis of influenza from Nasopharyngeal swab specimens and should not be used as a sole basis for treatment. Nasal washings and aspirates are unacceptable for Xpert Xpress SARS-CoV-2/FLU/RSV testing.  Fact Sheet for Patients: BloggerCourse.com  Fact Sheet for Healthcare Providers: SeriousBroker.it  This test is not yet approved or cleared by the [place] FDA and has been authorized for detection and/or diagnosis of SARS-CoV-2 by FDA under an Emergency Use Authorization (EUA). This EUA will remain in effect (meaning this test can be used) for the duration of the COVID-19 declaration under Section 564(b)(1) of the Act, 21 U.S.C. section 360bbb-3(b)(1), unless the authorization is terminated or revoked.     Resp Syncytial Virus by PCR NEGATIVE NEGATIVE Final    Comment: (NOTE) Fact Sheet for Patients: BloggerCourse.com  Fact Sheet for Healthcare Providers: SeriousBroker.it  This test is not yet approved or cleared by the [place] FDA and has been authorized for detection and/or diagnosis of SARS-CoV-2 by FDA under an Emergency Use Authorization (EUA). This EUA will remain in effect (meaning this test can be used) for the duration of the COVID-19 declaration under Section 564(b)(1) of the Act, 21 U.S.C. section 360bbb-3(b)(1), unless the authorization is terminated or revoked.  Performed at [facility], [address] [place], [place] [address]   Blood culture (routine x 2)     Status: None   Collection Time: 04/18/23  5:30 PM   Specimen: BLOOD  Result Value Ref Range Status   Specimen Description BLOOD BLOOD LEFT FOREARM  Final   Special Requests   Final    BOTTLES DRAWN AEROBIC AND ANAEROBIC Blood Culture results may not be optimal due to an inadequate volume of blood received in culture bottles   Culture   Final    NO GROWTH 5 DAYS Performed at [facility], [address], [place], [place] [address]    Report Status 04/23/2023 FINAL  Final  Blood culture (routine x 2)     Status: None   Collection Time: 04/18/23  7:42 PM   Specimen: BLOOD  Result Value Ref Range Status   Specimen Description BLOOD BLOOD RIGHT HAND  Final   Special Requests   Final    BOTTLES DRAWN AEROBIC AND ANAEROBIC Blood Culture adequate volume   Culture   Final    NO GROWTH 5 DAYS Performed at [facility], [address] [place], [place] [address]    Report Status 04/23/2023 FINAL  Final  Expectorated Sputum Assessment w Gram Stain, Rflx to Resp Cult     Status: None   Collection Time: 04/18/23  8:32 PM   Specimen: Sputum  Result  Value Ref Range Status   Specimen Description SPUTUM  Final   Special Requests NONE  Final   Sputum evaluation   Final    THIS SPECIMEN IS ACCEPTABLE FOR SPUTUM CULTURE Performed at [facility], [address] [place], [place] [address]    Report Status 04/18/2023 FINAL  Final  Culture, Respiratory w Gram Stain     Status: None   Collection Time: 04/18/23  8:32 PM   Specimen: SPU  Result Value Ref Range Status   Specimen Description   Final    SPUTUM Performed at [facility], [address] [place], [place] [address]    Special Requests   Final    NONE Reflexed from [id number] Performed at [facility], [address] [place], [place] [address]    Gram Stain   Final    FEW SQUAMOUS EPITHELIAL CELLS  PRESENT FEW WBC PRESENT,BOTH PMN AND MONONUCLEAR FEW YEAST FEW GRAM NEGATIVE RODS FEW GRAM POSITIVE RODS FEW GRAM POSITIVE COCCI IN PAIRS Performed at [facility], [address], [place], [place] [address]    Culture   Final    ABUNDANT PROVIDENCIA RETTGERI ABUNDANT PROTEUS MIRABILIS    Report Status 04/21/2023 FINAL  Final   Organism ID, Bacteria PROVIDENCIA RETTGERI  Final   Organism ID, Bacteria PROTEUS MIRABILIS  Final      Susceptibility   Proteus mirabilis - MIC*    AMPICILLIN <=2 SENSITIVE Sensitive     CEFEPIME <=0.12 SENSITIVE Sensitive     CEFTAZIDIME <=1 SENSITIVE Sensitive     CEFTRIAXONE <=0.25 SENSITIVE Sensitive     CIPROFLOXACIN <=0.25 SENSITIVE Sensitive     GENTAMICIN <=1 SENSITIVE Sensitive     IMIPENEM 2 SENSITIVE Sensitive     TRIMETH/SULFA <=20 SENSITIVE Sensitive     AMPICILLIN/SULBACTAM <=2 SENSITIVE Sensitive     PIP/TAZO <=4 SENSITIVE Sensitive ug/mL    * ABUNDANT PROTEUS MIRABILIS   Providencia rettgeri - MIC*    AMPICILLIN >=32 RESISTANT Resistant     CEFEPIME <=0.12 SENSITIVE Sensitive     CEFTAZIDIME <=1 SENSITIVE Sensitive     CEFTRIAXONE <=0.25 SENSITIVE Sensitive     CIPROFLOXACIN <=0.25 SENSITIVE Sensitive     GENTAMICIN <=1 SENSITIVE Sensitive     IMIPENEM 2 SENSITIVE Sensitive     TRIMETH/SULFA <=20 SENSITIVE Sensitive     AMPICILLIN/SULBACTAM 16 INTERMEDIATE Intermediate     PIP/TAZO <=4 SENSITIVE Sensitive ug/mL    * ABUNDANT PROVIDENCIA RETTGERI  MRSA Next Gen by PCR, Nasal     Status: None   Collection Time: 04/19/23 12:52 AM   Specimen: Nasal Mucosa; Nasal Swab  Result Value Ref Range Status   MRSA by PCR Next Gen NOT DETECTED NOT DETECTED Final    Comment: (NOTE) The GeneXpert MRSA Assay (FDA approved for NASAL specimens only), is one component of a comprehensive MRSA colonization surveillance program. It is not intended to diagnose MRSA infection nor to guide or monitor treatment for MRSA infections. Test performance  is not FDA approved in patients less than 18 years old. Performed at [facility], [address] [place], [place] [address]   Culture, blood (Routine X 2) w Reflex to ID Panel     Status: None (Preliminary result)   Collection Time: 04/22/23 12:27 PM   Specimen: BLOOD  Result Value Ref Range Status   Specimen Description BLOOD BLOOD LEFT HAND  Final   Special Requests   Final    BOTTLES DRAWN AEROBIC AND ANAEROBIC Blood Culture  adequate volume   Culture   Final    NO GROWTH 3 DAYS Performed at [facility], [address] [place], [place] [address]    Report Status PENDING  Incomplete  Culture, blood (Routine X 2) w Reflex to ID Panel     Status: None (Preliminary result)   Collection Time: 04/22/23 12:37 PM   Specimen: BLOOD  Result Value Ref Range Status   Specimen Description BLOOD BLOOD RIGHT HAND  Final   Special Requests   Final    BOTTLES DRAWN AEROBIC AND ANAEROBIC Blood Culture adequate volume   Culture   Final    NO GROWTH 3 DAYS Performed at [facility], [address] [place], [place] [address]    Report Status PENDING  Incomplete  Culture, Respiratory w Gram Stain     Status: None (Preliminary result)   Collection Time: 04/22/23  1:22 PM   Specimen: Tracheal Aspirate  Result Value Ref Range Status   Specimen Description   Final    TRACHEAL ASPIRATE Performed at [facility], [address], [place], [place] [address]    Special Requests   Final    NONE Performed at [facility], [address] [place], [place] [address]    Gram Stain   Final    ABUNDANT SQUAMOUS EPITHELIAL CELLS PRESENT ABUNDANT WBC PRESENT, PREDOMINANTLY PMN FEW GRAM NEGATIVE RODS RARE GRAM POSITIVE COCCI IN PAIRS    Culture   Final    CULTURE REINCUBATED FOR BETTER GROWTH Performed at [facility], [address], [place], [place] [address]    Report Status PENDING  Incomplete  Resp panel by RT-PCR (RSV, Flu A&B, Covid)  Anterior Nasal Swab     Status: None   Collection Time: 04/22/23  5:53 PM   Specimen: Anterior Nasal Swab  Result Value Ref Range Status   SARS Coronavirus 2 by RT PCR NEGATIVE NEGATIVE Final    Comment: (NOTE) SARS-CoV-2 target nucleic acids are NOT DETECTED.  The SARS-CoV-2 RNA is generally detectable in upper respiratory specimens during the acute phase of infection. The lowest concentration of SARS-CoV-2 viral copies this assay can detect is 138 copies/mL. A negative result does not preclude SARS-Cov-2 infection and should not be used as the sole basis for treatment or other patient management decisions. A negative result may occur with  improper specimen collection/handling, submission of specimen other than nasopharyngeal swab, presence of viral mutation(s) within the areas targeted by this assay, and inadequate number of viral copies(<138 copies/mL). A negative result must be combined with clinical observations, patient history, and epidemiological information. The expected result is Negative.  Fact Sheet for Patients:  BloggerCourse.com  Fact Sheet for Healthcare Providers:  SeriousBroker.it  This test is no t yet approved or cleared by the [place] FDA and  has been authorized for detection and/or diagnosis of SARS-CoV-2 by FDA under an Emergency Use Authorization (EUA). This EUA will remain  in effect (meaning this test can be used) for the duration of the COVID-19 declaration under Section 564(b)(1) of the Act, 21 U.S.C.section 360bbb-3(b)(1), unless the authorization is terminated  or revoked sooner.       Influenza A by PCR NEGATIVE NEGATIVE Final   Influenza B by PCR NEGATIVE NEGATIVE Final    Comment: (NOTE) The Xpert Xpress SARS-CoV-2/FLU/RSV plus assay is intended as an aid in the diagnosis of influenza from Nasopharyngeal swab specimens and should not be used as a sole basis for treatment. Nasal washings  and aspirates are unacceptable for Xpert Xpress SARS-CoV-2/FLU/RSV testing.  Fact Sheet for Patients: BloggerCourse.com  Fact Sheet for Healthcare Providers: SeriousBroker.it  This test is not yet approved or cleared by the [place] FDA and has been authorized for detection and/or diagnosis of SARS-CoV-2 by FDA under an Emergency Use Authorization (EUA). This EUA will remain in effect (meaning this test can be used) for the duration of the COVID-19 declaration under Section 564(b)(1) of the Act, 21 U.S.C. section 360bbb-3(b)(1), unless the authorization is terminated or revoked.     Resp Syncytial Virus by PCR NEGATIVE NEGATIVE Final    Comment: (NOTE) Fact Sheet for Patients: BloggerCourse.com  Fact Sheet for Healthcare Providers: SeriousBroker.it  This test is not yet approved or cleared by the [place] FDA and has been authorized for detection and/or diagnosis of SARS-CoV-2 by FDA under an Emergency Use Authorization (EUA). This EUA will remain in effect (meaning this test can be used) for the duration of the COVID-19 declaration under Section 564(b)(1) of the Act, 21 U.S.C. section 360bbb-3(b)(1), unless the authorization is terminated or revoked.  Performed at [facility], [address] [place], [place] [address]     Medications: Scheduled Meds:  apixaban  5 mg Per Tube BID   atropine  2 drop Sublingual QID   budesonide (PULMICORT) nebulizer solution  0.25 mg Nebulization BID   carvedilol  6.25 mg Per Tube BID WC   clonazePAM  0.5 mg Per Tube BID   famotidine  20 mg Per Tube BID   free water  200 mL Per Tube Q6H   glycopyrrolate  1 mg Per Tube TID   insulin aspart  0-9 Units Subcutaneous Q4H   insulin glargine-yfgn  10 Units Subcutaneous QHS   ipratropium-albuterol  3 mL Nebulization TID   levETIRAcetam  750 mg Per Tube BID    metoCLOPramide  10 mg Per Tube Q8H   nutrition supplement (JUVEN)  1 packet Per Tube BID BM   oxyCODONE  2.5 mg Per Tube Q8H   polyethylene glycol  17 g Per Tube Daily   valproic acid  750 mg Per Tube TID   Continuous Infusions:  feeding supplement (GLUCERNA 1.5 CAL) 65 mL/hr at 04/25/23 0552   piperacillin-tazobactam (ZOSYN)  IV 3.375 g (04/25/23 1323)   PRN Meds:.acetaminophen (TYLENOL) oral liquid 160 mg/5 mL, albuterol, guaiFENesin, iohexol, LORazepam, ondansetron (ZOFRAN) IV  Allergies  Allergen Reactions   Lisinopril Swelling    angioedema  Other Reaction(s): angioedema    Urinalysis: Recent Labs    04/24/23 1935  COLORURINE YELLOW*  LABSPEC 1.020  PHURINE 5.0  GLUCOSEU NEGATIVE  HGBUR SMALL*  BILIRUBINUR NEGATIVE  KETONESUR NEGATIVE  PROTEINUR 100*  NITRITE NEGATIVE  LEUKOCYTESUR NEGATIVE      Imaging: US RENAL Result Date: 04/25/2023 CLINICAL DATA:  [id number] AKI (acute kidney injury) ([name]) [id number] EXAM: RENAL / URINARY TRACT ULTRASOUND COMPLETE COMPARISON:  April 24, 2023 FINDINGS: Evaluation is limited by inability to optimally position patient or hold breath. Right Kidney: Renal measurements: 12.5 x 6.6 x 6.9 cm = volume: 297 mL. Echogenicity is the upper limits of normal. No suspicious mass or hydronephrosis visualized. Benign cyst is incidentally noted measuring up to 2.9 cm (for which no dedicated imaging follow-up is recommended). Left Kidney: Renal measurements: 11.4 x 8.4 x 7.3 cm = volume: 363 mL. Echogenicity is the upper limits of normal. No mass or hydronephrosis visualized. Bladder: Appears normal for degree of bladder distention. Prevoid volume 108 ML. Other: None. IMPRESSION: No  hydronephrosis. Electronically Signed   By: Meda Klinefelter M.D.   On: 04/25/2023 13:13   CT CHEST ABDOMEN PELVIS WO CONTRAST Result Date: 04/24/2023 CLINICAL DATA:  Sepsis EXAM: CT CHEST, ABDOMEN AND PELVIS WITHOUT CONTRAST TECHNIQUE: Multidetector CT imaging of the chest,  abdomen and pelvis was performed following the standard protocol without IV contrast. RADIATION DOSE REDUCTION: This exam was performed according to the departmental dose-optimization program which includes automated exposure control, adjustment of the mA and/or kV according to patient size and/or use of iterative reconstruction technique. COMPARISON:  Chest x-ray 04/22/2023, CT 05/17/2022, 08/24/2021 FINDINGS: CT CHEST FINDINGS Cardiovascular: Limited assessment without intravenous contrast. Maximum ascending aortic diameter of 4 cm. Cardiomegaly. No pericardial effusion. Mediastinum/Nodes: Tracheostomy tube tip about 3.5 cm superior to the carina. No suspicious lymph nodes. Esophagus within normal limits Lungs/Pleura: Small bilateral pleural effusions. Hazy perihilar densities and mild septal thickening. Confluent consolidation in the left lower lobe with partial right lower lobe consolidation. Musculoskeletal: No acute osseous abnormality.  Gynecomastia CT ABDOMEN PELVIS FINDINGS Hepatobiliary: No focal liver abnormality is seen. No gallstones, gallbladder wall thickening, or biliary dilatation. Pancreas: Unremarkable. No pancreatic ductal dilatation or surrounding inflammatory changes. Spleen: Normal in size without focal abnormality. Adrenals/Urinary Tract: Adrenal glands are normal. Kidneys show no hydronephrosis. The bladder is normal Stomach/Bowel: Gastrostomy tube in the stomach. Enteral contrast in the stomach and small bowel with dilute contrast in the colon. No acute bowel wall thickening. Negative appendix. Vascular/Lymphatic: Aortic atherosclerosis. No enlarged abdominal or pelvic lymph nodes. Reproductive: Negative prostate Other: Negative for pelvic effusion or free air Musculoskeletal: No acute or suspicious osseous abnormality IMPRESSION: 1. Cardiomegaly with small bilateral pleural effusions and mild septal thickening and bilateral perihilar ground-glass density suggesting a degree of pulmonary  edema. 2. Confluent consolidation in the left lower lobe with partial right lower lobe consolidation, findings are suspicious for pneumonia or aspiration. 3. No CT evidence for acute intra-abdominal or pelvic abnormality. 4. Aortic atherosclerosis. Aortic Atherosclerosis (ICD10-I70.0). Electronically Signed   By: Jasmine Pang M.D.   On: 04/24/2023 17:42   EEG adult Result Date: 04/24/2023 Charlsie Quest, MD     04/24/2023  2:10 PM Patient Name: Billy Cherry MRN: [id number] Epilepsy Attending: Charlsie Quest Referring Physician/Provider: Alford [name], MD Date: 04/24/2023 Duration: 22.13 mins Patient history: 53yo M with h/o epilepsy getting eeg to evaluate for seizure. Level of alertness:  comatose/ lethargic AEDs during EEG study: LEV, VPA Technical aspects: This EEG study was done with scalp electrodes positioned according to the 10-20 International system of electrode placement. Electrical activity was reviewed with band pass filter of 1-70Hz , sensitivity of 7 uV/mm, display speed of 27mm/sec with a 60Hz  notched filter applied as appropriate. EEG data were recorded continuously and digitally stored.  Video monitoring was available and reviewed as appropriate. Description: EEG showed continuous generalized 3 to 5 Hz theta-delta slowing. Hyperventilation and photic stimulation were not performed.   EKG artifact was seen throughout the study ABNORMALITY - Continuous slow, generalized IMPRESSION: This study is suggestive of severe diffuse encephalopathy. No seizures or definite epileptiform discharges were seen throughout the recording. Priyanka Annabelle Harman      Assessment/Plan:  Billy Cherry is a 53 y.o. 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  He is status post tracheostomy and PEG tube placement, chronic systolic CHF, hypertension, DVT on chronic anticoagulation with  Eliquis, HTN, DM, CAD, seizure, who  presents with SOB was admitted on 04/18/2023 for :  Hyperkalemia [E87.5] Acute on chronic systolic CHF (congestive heart failure) ([name]) [I50.23] Pneumonia due to infectious organism, unspecified laterality, unspecified part of lung [J18.9] Acute on chronic congestive heart failure, unspecified heart failure type ([name]) [I50.9] Sepsis, due to unspecified organism, unspecified whether acute organ dysfunction present ([name]) [A41.9]  1.  Acute kidney injury Imaging-no hydronephrosis noted. Urinalysis-04/24/2023-small hemoglobin 11-20 RBCs, rare WBCs Likely ATN secondary to concurrent illness, pneumonia. Patient is nonoliguric. Electrolytes and volume status are acceptable.  No acute indication for dialysis at present.  2.  Hyperkalemia Recommend to change tube feeds to low potassium formula such as Nepro.    Breshae Belcher Thedore Mins 04/25/23

## 2023-04-25 NOTE — Assessment & Plan Note (Addendum)
 Initial sputum culture grew Proteus and providencia, which were treated appropriately. Patient had a fever of 102 on 2/17.  Antibiotics switched to Zosyn, Zithromax and added vancomycin back.  Blood cultures so far negative.  Repeat COVID influenza and RSV negative.  Sputum culture growing Pseudomonas and Proteus -As he continued to have fever spikes-ID was consulted -Repeat blood and urine cultures negative,  CT chest abdomen and pelvis with concern of bilateral pneumonia. -Vancomycin was discontinued as MRSA PCR was negative -Continue with Zosyn until 04/28/2023 as recommended by ID -S/p bronchoscopy with removal of mucous plugging and BAL was sent for culture.

## 2023-04-25 NOTE — Assessment & Plan Note (Addendum)
 Gentle fluids started with worsening kidney function.  Patient on tube feedings.  Continue Coreg.  Repeat echo showed a stable EF of 30 to 35%.  Weight on 2/13 was 263 pounds and not sure how accurate that weight is.  Weight on 2/16 was 232.37.  Today's weight 248.46.  Continue to monitor. pulmonary edema on CT chest,  -Currently on Lasix infusion.

## 2023-04-25 NOTE — Assessment & Plan Note (Addendum)
 Some improvement in potassium, still mildly elevated at 5.3 -Continue Lokelma -Switched dietary formula from Glucerna to Nepro -Continue to monitor

## 2023-04-25 NOTE — Assessment & Plan Note (Addendum)
 AKI on CKD stage II.  Creatinine now at least plateaued, currently at 3.59 .CT chest with concern of some pulmonary edema.  No significant urinary retention and renal ultrasound without any hydronephrosis. Nephrology consult -Patient was started on Lasix infusion for gentle diuresis -Monitor renal function -Strict intake and output -Avoid nephrotoxins

## 2023-04-25 NOTE — Progress Notes (Signed)
Progress Note   Patient: Billy Cherry WUJ:[id number] DOB: 08-19-1970 DOA: 04/18/2023     7 DOS: the patient was seen and examined on 04/25/2023   Brief hospital course: 53 y.o. 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   Patient is nonverbal.  Per her wife at the bedside, patient has increased shortness of breath with increased tracheal secretion in the past several days.  No fever or chills.  Patient has increased fluid retention, particularly in arms per her wife.  Patient does not see to have chest pain, nausea, vomiting, abdominal pain per his wife.  Patient was found to have oxygen desaturation to 88% on room air, which improved to 99% of 4L -6L oxygen in ED.    Data reviewed independently and ED Course: pt was found to have BNP 349, potassium 5.8, GFR> 60, WBC 17.1, negative PCR for flu, COVID and RSV, troponin 13 --> 15, procalcitonin 0.17.  Chest x-ray showed bilateral basilar opacity (the left is worse than the right).  Temperature 99.1, blood pressure 125/100, heart rate of 108, RR 22.  Patient is admitted to PCU as inpatient.  2/14.  Patient on 40% trach collar.  Patient on IV Lasix to remove fluid and IV antibiotics for pneumonia. 2/15.  Creatinine went up to 1.6 will decrease Lasix down to 20 mg IV twice daily.  Switch tube feedings over to Glucerna that he takes at home. Dietitian to adjust rate to goal and free water. 2/16.  Creatinine up at 1.66.  Will hold Lasix for the rest of today and recheck creatinine tomorrow.  Try to get patient down to his baseline oxygen.  Echocardiogram shows an EF of 30 to 35% stable from previous. 2/17.  Creatinine up at 1.85.  Patient spiked a fever of 102.  Will get blood cultures and change antibiotics to Zithromax and Zosyn.   Chest x-ray showing left lower lobe pneumonia.  Repeat COVID RSV and influenza negative. 2/18.  Creatinine up at 2.22.  Maintenance fluids started.  Added vancomycin. 2/19: Creatinine continued to get worse, currently at 2.91 with BUN of 96, IV Lasix has been discontinued and he was getting some IV fluid.  No significant volume on bladder scan.  Continue to have fever, maximum recorded at 101.8 over the past 24-hour.  MRSA PCR was negative so vancomycin is being discontinued.  Blood cultures from 12/17 remain negative.  Repeat sputum cultures pending consulting infectious disease, CT chest, abdomen and pelvis, UA with urine culture was ordered. EEG with severe diffuse encephalopathy, no seizure or definitive epileptiform discharges were noted. 2/20: Afebrile this morning but maximum temperature recorded was 100.6 over the past 24-hour.  Worsening renal function, UA with protein urea and rare bacteria, urine cultures pending, CT chest, abdomen and pelvis with cardiomegaly, small bilateral pleural effusion and mild septal thickening, bilateral perihilar groundglass density suggestive of some pulmonary edema.  Also has a confluent consolidation in the left lower lobe and partial right lower lobe suspicious for pneumonia or aspiration. No evidence of any acute intra-abdominal abnormality. Stopping gentle IV fluid and ordered 1 dose of IV Lasix 40 mg Renal ultrasound with some renal echogenicity, no hydronephrosis. Also consulted nephrology   Assessment and Plan: * Multifocal pneumonia Patient initially placed on vancomycin Maxipime and Flagyl.  Since MRSA PCR is negative, vancomycin was discontinued.  Initial sputum culture grew Proteus and providencia, which were treated appropriately. Patient had a fever of 102 on 2/17.  Antibiotics switched to Zosyn, Zithromax and added vancomycin back.  Blood cultures so far negative.  Repeat COVID influenza and RSV negative.  Sputum culture growing gram-positive cocci  in pairs and gram-negative rods. -As he continued to have fever spikes-ID was consulted -Repeat blood cultures remain negative, sputum cultures pending.  Ordered CT chest, abdomen and pelvis, UA and urine culture. -Discontinue vancomycin due to worsening renal function -Follow-up ID recommendations -Continue with Zosyn and Zithromax for now  Acute on chronic systolic CHF (congestive heart failure) ([name]) Gentle fluids started with worsening kidney function.  Patient on tube feedings.  Continue Coreg.  Repeat echo showed a stable EF of 30 to 35%.  Weight on 2/13 was 263 pounds and not sure how accurate that weight is.  Weight on 2/16 was 232.37.  Today's weight 248.46.  Continue to monitor.  Acute kidney injury superimposed on CKD ([name]) AKI on CKD stage II.  Creatinine up to 2.91 with diuresis.  Continue to hold diuresis.  Patient on gentle fluids. -Monitor renal function -Avoid nephrotoxins  HCAP (healthcare-associated pneumonia)-resolved as of 04/19/2023 Patient started on Maxipime and Flagyl.  Vancomycin was discontinued with MRSA PE CR being negative  Acute on chronic hypoxic respiratory failure ([name]) Patient normally wears 6 L.  1 pulse ox of 88%.  Currently on 40% Ventimask with 6 L flow.  Hyperkalemia Improved  Seizure disorder ([name]) Continue Keppra and Depakote.  Will get an EEG.  Essential hypertension Continue Coreg  Pulmonary embolus ([name]) On Eliquis  DVT (deep venous thrombosis) ([name]) On Eliquis  S/P percutaneous endoscopic gastrostomy (PEG) tube placement ([name]) Continue Glucerna tube feedings.  Tracheostomy dependent ([name]) On 40% oxygen, 6 L flow.  Arterial hypotension Hypotension resolved  Iron deficiency anemia Ferritin 53 and last hemoglobin 9.1.  Anoxic encephalopathy ([name]) Supportive care      Subjective: Patient remained minimally responsive which is his baseline, he like having a lot of upper respiratory secretions.  Physical Exam: Vitals:    04/25/23 0400 04/25/23 0500 04/25/23 0753 04/25/23 1259  BP: (!) 115/92   (!) 135/108  Pulse: 97   99  Resp: 17   18  Temp: 98.5 F (36.9 C)  99.4 F (37.4 C) 97.9 F (36.6 C)  TempSrc: Axillary  Oral Oral  SpO2: 97%   (!) 85%  Weight:  117.2 kg    Height:       General.  Obese gentleman, in no acute distress. Pulmonary.  A lot of upper respiratory transmitted sounds. CV.  Regular rate and rhythm, no JVD, rub or murmur. Abdomen.  Soft, nontender, nondistended, BS positive. CNS.  Awake, minimally responsive, not following any commands Extremities.  No edema, no cyanosis, pulses intact and symmetrical.  Data Reviewed: Prior data reviewed  Family Communication: Talked with wife on phone.  Disposition: Status is: Inpatient Remains inpatient appropriate because: Severity of illness  Planned Discharge Destination: Home with Home Health  DVT prophylaxis.  Eliquis Time spent: 50 minutes  This record has been created using Conservation officer, historic buildings. Errors have been sought and corrected,but may not always be located. Such creation errors do not reflect on the standard of care.   Author: [name], MD 04/25/2023 3:08 PM  For on call review [email or web address].

## 2023-04-26 ENCOUNTER — Inpatient Hospital Stay: Payer: Medicaid Other

## 2023-04-26 DIAGNOSIS — R0602 Shortness of breath: Secondary | ICD-10-CM | POA: Diagnosis not present

## 2023-04-26 DIAGNOSIS — J690 Pneumonitis due to inhalation of food and vomit: Secondary | ICD-10-CM | POA: Diagnosis not present

## 2023-04-26 DIAGNOSIS — J189 Pneumonia, unspecified organism: Secondary | ICD-10-CM | POA: Diagnosis not present

## 2023-04-26 DIAGNOSIS — J9621 Acute and chronic respiratory failure with hypoxia: Secondary | ICD-10-CM | POA: Diagnosis not present

## 2023-04-26 DIAGNOSIS — R509 Fever, unspecified: Secondary | ICD-10-CM | POA: Diagnosis not present

## 2023-04-26 DIAGNOSIS — E875 Hyperkalemia: Secondary | ICD-10-CM | POA: Diagnosis not present

## 2023-04-26 DIAGNOSIS — N179 Acute kidney failure, unspecified: Secondary | ICD-10-CM | POA: Diagnosis not present

## 2023-04-26 LAB — CBC
HCT: 29.3 % — ABNORMAL LOW (ref 39.0–52.0)
Hemoglobin: 8.9 g/dL — ABNORMAL LOW (ref 13.0–17.0)
MCH: 33.1 pg (ref 26.0–34.0)
MCHC: 30.4 g/dL (ref 30.0–36.0)
MCV: 108.9 fL — ABNORMAL HIGH (ref 80.0–100.0)
Platelets: 175 10*3/uL (ref 150–400)
RBC: 2.69 MIL/uL — ABNORMAL LOW (ref 4.22–5.81)
RDW: 14.5 % (ref 11.5–15.5)
WBC: 9.8 10*3/uL (ref 4.0–10.5)
nRBC: 0.2 % (ref 0.0–0.2)

## 2023-04-26 LAB — BASIC METABOLIC PANEL
Anion gap: 18 — ABNORMAL HIGH (ref 5–15)
Anion gap: 13 (ref 5–15)
BUN: 126 mg/dL — ABNORMAL HIGH (ref 6–20)
BUN: 167 mg/dL — ABNORMAL HIGH (ref 6–20)
CO2: 24 mmol/L (ref 22–32)
CO2: 29 mmol/L (ref 22–32)
Calcium: 8.4 mg/dL — ABNORMAL LOW (ref 8.9–10.3)
Calcium: 8.5 mg/dL — ABNORMAL LOW (ref 8.9–10.3)
Chloride: 99 mmol/L (ref 98–111)
Chloride: 99 mmol/L (ref 98–111)
Creatinine, Ser: 3.56 mg/dL — ABNORMAL HIGH (ref 0.61–1.24)
Creatinine, Ser: 3.66 mg/dL — ABNORMAL HIGH (ref 0.61–1.24)
GFR, Estimated: 20 mL/min — ABNORMAL LOW (ref >60)
GFR, Estimated: 19 mL/min — ABNORMAL LOW (ref >60)
Glucose, Bld: 171 mg/dL — ABNORMAL HIGH (ref 70–99)
Glucose, Bld: 170 mg/dL — ABNORMAL HIGH (ref 70–99)
Potassium: 5.7 mmol/L — ABNORMAL HIGH (ref 3.5–5.1)
Potassium: 5.7 mmol/L — ABNORMAL HIGH (ref 3.5–5.1)
Sodium: 141 mmol/L (ref 135–145)
Sodium: 141 mmol/L (ref 135–145)

## 2023-04-26 LAB — RENAL FUNCTION PANEL
Albumin: 2.4 g/dL — ABNORMAL LOW (ref 3.5–5.0)
Anion gap: 12 (ref 5–15)
BUN: 126 mg/dL — ABNORMAL HIGH (ref 6–20)
CO2: 29 mmol/L (ref 22–32)
Calcium: 8.4 mg/dL — ABNORMAL LOW (ref 8.9–10.3)
Chloride: 102 mmol/L (ref 98–111)
Creatinine, Ser: 3.48 mg/dL — ABNORMAL HIGH (ref 0.61–1.24)
GFR, Estimated: 20 mL/min — ABNORMAL LOW (ref >60)
Glucose, Bld: 188 mg/dL — ABNORMAL HIGH (ref 70–99)
Phosphorus: 7.5 mg/dL — ABNORMAL HIGH (ref 2.5–4.6)
Potassium: 5.6 mmol/L — ABNORMAL HIGH (ref 3.5–5.1)
Sodium: 143 mmol/L (ref 135–145)

## 2023-04-26 LAB — GLUCOSE, CAPILLARY
Glucose-Capillary: 222 mg/dL — ABNORMAL HIGH (ref 70–99)
Glucose-Capillary: 183 mg/dL — ABNORMAL HIGH (ref 70–99)
Glucose-Capillary: 173 mg/dL — ABNORMAL HIGH (ref 70–99)
Glucose-Capillary: 172 mg/dL — ABNORMAL HIGH (ref 70–99)
Glucose-Capillary: 174 mg/dL — ABNORMAL HIGH (ref 70–99)

## 2023-04-26 LAB — D-DIMER, QUANTITATIVE: D-Dimer, Quant: 0.7 ug{FEU}/mL — ABNORMAL HIGH (ref 0.00–0.50)

## 2023-04-26 MED ORDER — SODIUM CHLORIDE 3 % IN NEBU: 4.0000 mL | INHALATION_SOLUTION | RESPIRATORY_TRACT | Status: AC | PRN

## 2023-04-26 MED ORDER — PIPERACILLIN-TAZOBACTAM 3.375 G IVPB
3.3750 g | Freq: Three times a day (TID) | INTRAVENOUS | Status: DC
  Administered 2023-04-26 – 2023-04-27 (×3): 3.375 g via INTRAVENOUS
  Filled 2023-04-26 (×3): qty 50

## 2023-04-26 MED ORDER — PROSOURCE TF20 ENFIT COMPATIBL EN LIQD
60.0000 mL | Freq: Every day | ENTERAL | Status: DC
  Administered 2023-04-27 – 2023-05-01 (×5): 60 mL
  Filled 2023-04-26 (×5): qty 60

## 2023-04-26 MED ORDER — SODIUM ZIRCONIUM CYCLOSILICATE 5 G PO PACK
10.0000 g | PACK | Freq: Every day | ORAL | Status: DC
  Administered 2023-04-26 – 2023-04-29 (×4): 10 g
  Filled 2023-04-26: qty 1
  Filled 2023-04-26: qty 2
  Filled 2023-04-26: qty 1
  Filled 2023-04-26: qty 2

## 2023-04-26 MED ORDER — NEPRO/CARBSTEADY PO LIQD
1000.0000 mL | ORAL | Status: DC
  Administered 2023-04-26 – 2023-05-01 (×5): 1000 mL

## 2023-04-26 MED ORDER — FUROSEMIDE 10 MG/ML IJ SOLN
80.0000 mg | Freq: Once | INTRAMUSCULAR | Status: AC
  Administered 2023-04-26: 80 mg via INTRAVENOUS
  Filled 2023-04-26: qty 8

## 2023-04-26 NOTE — Progress Notes (Addendum)
Date of Admission:  04/18/2023      ID: Billy Cherry is a 53 y.o. male    Principal Problem:   Multifocal pneumonia Active Problems:   Anoxic encephalopathy ([name])   DVT (deep venous thrombosis) ([name])   Pulmonary embolus ([name])   Tracheostomy dependent ([name])   S/P percutaneous endoscopic gastrostomy (PEG) tube placement ([name])   Acute on chronic hypoxic respiratory failure ([name])   Essential hypertension   Seizure disorder ([name])   Acute on chronic systolic CHF (congestive heart failure) ([name])   Hyperkalemia   Acute kidney injury superimposed on CKD ([name])   Iron deficiency anemia   Arterial hypotension   Acute on chronic congestive heart failure ([name])   Aspiration pneumonia of both lower lobes ([name])    Antibiotics 2/13-2/17 Cefepime 2/14-2/16 lagyl 2/13-2/14 vanco 2/17-2/19 azithro 2/18 one dose vanco 2/17--2/21   zosyn 2/21 Meropenem   Subjective: None available  Medications:   apixaban  5 mg Per Tube BID   atropine  2 drop Sublingual QID   budesonide (PULMICORT) nebulizer solution  0.25 mg Nebulization BID   carvedilol  6.25 mg Per Tube BID WC   clonazePAM  0.5 mg Per Tube BID   famotidine  20 mg Per Tube BID   [START ON 04/27/2023] feeding supplement (PROSource TF20)  60 mL Per Tube Daily   free water  200 mL Per Tube Q6H   glycopyrrolate  1 mg Per Tube TID   insulin aspart  0-9 Units Subcutaneous Q4H   insulin glargine-yfgn  10 Units Subcutaneous QHS   ipratropium-albuterol  3 mL Nebulization TID   levETIRAcetam  750 mg Per Tube BID   metoCLOPramide  10 mg Per Tube Q8H   nutrition supplement (JUVEN)  1 packet Per Tube BID BM   oxyCODONE  2.5 mg Per Tube Q8H   sodium zirconium cyclosilicate  10 g Per Tube Daily   valproic acid  750 mg Per Tube TID    Objective: Vital signs in last 24 hours: Patient Vitals for the past 24 hrs:  BP Temp Temp src Pulse Resp SpO2 Weight  04/26/23 1600 121/87 -- -- (!) 51 17 92 % --  04/26/23 1400 -- -- -- (!) 102 17 97 %  --  04/26/23 1205 -- 98.2 F (36.8 C) Oral -- 17 -- --  04/26/23 1200 (!) 123/100 -- -- (!) 103 17 96 % --  04/26/23 0800 (!) 112/94 -- -- (!) 102 20 97 % --  04/26/23 0700 -- (!) 97.3 F (36.3 C) -- -- -- -- --  04/26/23 0456 -- -- -- -- -- -- 117.5 kg  04/26/23 0333 (!) 122/92 98.9 F (37.2 C) Axillary -- -- -- --  04/26/23 0000 (!) 120/96 -- -- (!) 106 20 91 % --  04/25/23 2347 (!) 119/101 98.8 F (37.1 C) Axillary (!) 107 20 90 % --  04/25/23 2324 -- -- -- -- -- 92 % --       PHYSICAL EXAM:  General: non verbal, eyes open, does not follow any commands, does not track Tracheostomy Oxygen Lungs: b/l air entry-few rhochi  Heart: s1s2 Tachycardia  Abdomen: Soft, peg in place Gluteal area- b/l small skin tears base clean   Extremities: edema less Skin: No rashes or lesions. Or bruising Lymph: Cervical, supraclavicular normal. Neurologic: cannot assess  Lab Results    Latest Ref Rng & Units 04/26/2023    6:15 AM 04/25/2023    6:23 AM 04/24/2023    8:45 AM  CBC  WBC 4.0 - 10.5 K/uL 9.8  10.3  10.1   Hemoglobin 13.0 - 17.0 g/dL 8.9  8.8  9.1   Hematocrit 39.0 - 52.0 % 29.3  29.2  30.4   Platelets 150 - 400 K/uL 175  160  159        Latest Ref Rng & Units 04/26/2023    4:45 PM 04/26/2023    2:41 PM 04/26/2023    6:15 AM  CMP  Glucose 70 - 99 mg/dL 098  119  147   BUN 6 - 20 mg/dL 829  562  130   Creatinine 0.61 - 1.24 mg/dL 8.65  7.84  6.96   Sodium 135 - 145 mmol/L 141  141  143   Potassium 3.5 - 5.1 mmol/L 5.7  5.7  5.6   Chloride 98 - 111 mmol/L 99  99  102   CO2 22 - 32 mmol/L 29  24  29    Calcium 8.9 - 10.3 mg/dL 8.5  8.4  8.4       Microbiology: 2/17 [facility] NG Tracheal apsirate pseudomonas, proteus providencia and enterococcus Studies/Results: DG Chest Port 1 View Result Date: 04/26/2023 CLINICAL DATA:  Hypoxia EXAM: PORTABLE CHEST 1 VIEW COMPARISON:  CT 04/23/2018 FINDINGS: Tracheostomy tube noted. Enlarged cardiac silhouette. Low lung volumes. Central  venous congestion. LEFT basilar atelectasis. IMPRESSION: 1. Low lung volumes and LEFT basilar atelectasis. 2. Cardiomegaly and central venous congestion. Electronically Signed   By: Genevive Bi M.D.   On: 04/26/2023 20:22   US RENAL Result Date: 04/25/2023 CLINICAL DATA:  [id number] AKI (acute kidney injury) [facility]) [id number] EXAM: RENAL / URINARY TRACT ULTRASOUND COMPLETE COMPARISON:  April 24, 2023 FINDINGS: Evaluation is limited by inability to optimally position patient or hold breath. Right Kidney: Renal measurements: 12.5 x 6.6 x 6.9 cm = volume: 297 mL. Echogenicity is the upper limits of normal. No suspicious mass or hydronephrosis visualized. Benign cyst is incidentally noted measuring up to 2.9 cm (for which no dedicated imaging follow-up is recommended). Left Kidney: Renal measurements: 11.4 x 8.4 x 7.3 cm = volume: 363 mL. Echogenicity is the upper limits of normal. No mass or hydronephrosis visualized. Bladder: Appears normal for degree of bladder distention. Prevoid volume 108 ML. Other: None. IMPRESSION: No hydronephrosis. Electronically Signed   By: Meda Klinefelter M.D.   On: 04/25/2023 13:13     Assessment/Plan: 53 y.o. male with a history of anoxic brain injury following at home cardiac arrest in Jan 2023, complicated by prolonged hospital stay, tracheostomy, peg, vegetative state, PE, DVT presents with fever , sob and increased secretions   Fever : source lung - has resolved since started on zosyn   Pneumonia related to not clearing secretions and having tracheostomy and being colonized with multiple gram neg organisms- now has providencia and proteus Aspiration pneumonia  Chect CT left lower lobe consolidation and rt lower lobe partial consolidation questioning aspiration Pt is on zosyn - and fever has resolved Continue for a total of 7 days ( 2/23) Adjust dose for crcl   cardiomegaly and features of pulmonary edema on CT chest ?2 d echo shows EF 30-35% LV global  hypokinesis   Worsening AKI-    bladder scan no significant residue No hydronephrosis on imaging Nephrology on board- suspecting ATN, he is also on diuretics , and also likely has cardiorenal syndrome and got a few doses of vanco Discussed with Dr.Singh Wondered whether zosyn has a role but unlikely and also meropenem to be avoided  with Seizure history and being on valproate as per pharmacist.   Pt is on miralax every day and along with tube feeds- is having large liquid stools hence miralax made PRN   sacral stage 2 Not infected   Anemia  ? Anoxic encephalopathy   H/o DVT /PE   Discussed the management with Dr.Amin and his nurse ID will sign off  Call if needed

## 2023-04-26 NOTE — Progress Notes (Signed)
Nutrition Follow-up  DOCUMENTATION CODES:   Not applicable  INTERVENTION:   Change to Nepro @50ml /hr continuous + ProSource TF 20- Give 60ml daily via tube  Free water flushes q6 hours   Regimen provides 2240kcal/day, 117g/day protein and 1680ml/day of free water.   Juven Fruit Punch BID via tube, each serving provides 95kcal and 2.5g of protein (amino acids glutamine and arginine)  Daily weights   NUTRITION DIAGNOSIS:   Inadequate oral intake related to dysphagia as evidenced by NPO status. -ongoing   GOAL:   Patient will meet greater than or equal to 90% of their needs -met   MONITOR:   Weight trends, TF tolerance, Labs, I & O's  REASON FOR ASSESSMENT:   Consult Enteral/tube feeding initiation and management  ASSESSMENT:   53 y/o male with h/o CHF, CAD, HTN, PE/DVT, seizures and myoclonus after cardiac arrest complicated by ABI requiring tracheostomy and IR gastrostomy tube (20-French placed 04/26/21) and who is now admitted with PNA and AKI.  Received consult from MD with request to change tube feed formula from Glucerna to Nepro in the setting of hyperkalemia and AKI; this recommendation was discussed with family by MD. Will adjust tube feed formula per MD request. Per chart, pt has gained ~34lbs since having his G-tube placement; would recommend adjusting tube feed rate to provide less calories once pt has recovered from this acute illness. Per chart, pt appears weight stable since admission.    Medications reviewed and include: pepcid, robinul, insulin, reglan, juven, oxycodone, lokelma, zosyn   Labs reviewed: K 5.6(H), BUN 126(H), creat 3.48(H), P 7.5(H) Hgb 8.9(L), Hct 29.3(L) Cbgs- 173, 183, 222 x 24 hrs   Diet Order:   Diet Order             Diet NPO time specified  Diet effective now                  EDUCATION NEEDS:   No education needs have been identified at this time  Skin:  Skin Assessment: Skin Integrity Issues: Skin Integrity  Issues:: Stage II Stage II: sacrum  Last BM:  2/20- TYPE 7  Height:   Ht Readings from Last 1 Encounters:  04/18/23 5\' 11"  (1.803 m)    Weight:   Wt Readings from Last 1 Encounters:  04/26/23 117.5 kg    Ideal Body Weight:  78.2 kg  BMI:  Body mass index is 36.13 kg/m.  Estimated Nutritional Needs:   Kcal:  2000-2300kcal/day  Protein:  110-125 gm  Fluid:  2.0-2.3L/day  Betsey Holiday MS, RD, LDN If unable to be reached, please send secure chat to "RD inpatient" available from 8:00a-4:00p daily

## 2023-04-26 NOTE — Progress Notes (Signed)
Had a lengthy discussion with wife with unit director Misty Stanley, patient with significant life limiting comorbidities after sustaining anoxic brain injury in 2023, seems like multiorgan failure with heart and now worsening renal function.  Trach and PEG dependent. Requiring frequent suctioning, recurrent pneumonia.  Patient might need dialysis if renal function continued to get worse or worsening hyperkalemia.  We again discussed about involving palliative care but wife declined.  She was more fixative about respiratory status and keep suctioning him every 15-minute.  Tried my best to answer her questions and provided education with not much success.  She was keep asking different people to move him to ICU. Currently hemodynamically stable and not ICU appropriate.  We did reassured her that if he need we will have a low threshold to move him but currently all of his needs are being met in progressive care.  Seems like having a lot of unrealistic expectations from hospital care and his prognosis.  Patient has overall poor prognosis with extremely poor quality of life.

## 2023-04-26 NOTE — Progress Notes (Addendum)
       CROSS COVER NOTE  NAME: Billy Cherry MRN: [id number] DOB : Feb 16, 1971 ATTENDING PHYSICIAN: [name], MD    Date of Service   04/26/2023   HPI/Events of Note   Paged regarding patient having periods of apnea associated with significant oxygen desaturations HPI reviewed 53 year old in persistant vegetative state from anoxic brain injury (s/p trach and peg), CHF (EF 30-35%), CAD, DM, HTN, seizures, DVT/PE on eliquis).  He was admitted from home 2/13 secondary to decreased oxygen saturation on room air (previous baseline).  Patient initially started on treatment for CHF exacerbation and CAP (aspiration PNA) .  Echo this admit shows EF 30-35% with global hypokinesis, LVH and grad II diastolic dysfunction  Interventions   Assessment/Plan:\    04/26/2023    4:00 PM 04/26/2023    2:00 PM 04/26/2023   12:00 PM  Vitals with BMI  Systolic 121  123  Diastolic 87  100  Pulse 51 102 103   Patient appears at baseline neuro functioning spontaneous eye opening and reactive to care  - apneic pisodes likely not new Chest xray reviewed by me bedside with very low lung volumes and pulm congestion Discuseed with Dr Cherylann Ratel over chat  Discussed with E Ouma ACNP bedside Hypertonic saline nebs Lasix 80 mg x1 Wean oxygen as able If un able to wean transfer to ICU for vent assist   family was updated via phone     Donnie Mesa NP [facility] Cover 7pm-7am - check amion for availability Pager [phone]

## 2023-04-26 NOTE — Progress Notes (Signed)
[facility]  ROUNDING NOTE   Subjective:   Patient seen resting in bed No family present Alert, eyes open Trach collar 40% Tube feeding 65 mL/h No urine output noted in canister  Creatinine 3.48 Urine output 1125 mL in preceding 24 hours  Objective:  Vital signs in last 24 hours:  Temp:  [97.3 F (36.3 C)-98.9 F (37.2 C)] 98.2 F (36.8 C) (02/21 1205) Pulse Rate:  [102-108] 103 (02/21 1200) Resp:  [17-21] 17 (02/21 1200) BP: (112-128)/(92-101) 123/100 (02/21 1200) SpO2:  [90 %-97 %] 96 % (02/21 1200) FiO2 (%):  [40 %-50 %] 40 % (02/21 0425) Weight:  [117.5 kg] 117.5 kg (02/21 0456)  Weight change: 0.3 kg Filed Weights   04/24/23 0500 04/25/23 0500 04/26/23 0456  Weight: 112.7 kg 117.2 kg 117.5 kg    Intake/Output: I/O last 3 completed shifts: In: 3954.9 [I.V.:532.3; NG/GT:3200.3; IV Piggyback:222.3] Out: 1526 [Urine:1525; Stool:1]   Intake/Output this shift:  No intake/output data recorded.  Physical Exam: General: NAD  Head: Normocephalic, atraumatic. Moist oral mucosal membranes  Eyes: Anicteric  Lungs:  Course, trach collar  Heart: Regular rate and rhythm  Abdomen:  Soft, nontender, PEG tube  Extremities: Dependent peripheral edema.  Neurologic: Eyes open, unable to follow commands  Skin: Stage II sacral decubitus  Access: None    Basic Metabolic Panel: Recent Labs  Lab 04/22/23 0616 04/23/23 0556 04/24/23 0845 04/25/23 0623 04/26/23 0615  NA 144 146* 144 143 143  K 4.7 5.0 5.1 5.2* 5.6*  CL 104 106 105 102 102  CO2 30 29 27 28 29   GLUCOSE 143* 179* 196* 146* 188*  BUN 75* 84* 96* 96* 126*  CREATININE 1.85* 2.22* 2.91* 3.30* 3.48*  CALCIUM 8.6* 8.5* 8.3* 8.3* 8.4*  PHOS  --   --   --   --  7.5*    Liver Function Tests: Recent Labs  Lab 04/26/23 0615  ALBUMIN 2.4*   No results for input(s): "LIPASE", "AMYLASE" in the last 168 hours. No results for input(s): "AMMONIA" in the last 168 hours.  CBC: Recent Labs  Lab  04/21/23 0352 04/22/23 0616 04/23/23 0556 04/24/23 0845 04/25/23 0623 04/26/23 0615  WBC 9.9  --  9.2 10.1 10.3 9.8  NEUTROABS  --   --  5.3  --   --   --   HGB 9.7* 9.9* 9.7* 9.1* 8.8* 8.9*  HCT 32.0*  --  32.3* 30.4* 29.2* 29.3*  MCV 108.8*  --  108.8* 109.4* 109.0* 108.9*  PLT 148*  --  166 159 160 175    Cardiac Enzymes: No results for input(s): "CKTOTAL", "CKMB", "CKMBINDEX", "TROPONINI" in the last 168 hours.  BNP: Invalid input(s): "POCBNP"  CBG: Recent Labs  Lab 04/25/23 1936 04/25/23 2345 04/26/23 0332 04/26/23 0807 04/26/23 1206  GLUCAP 180* 186* 222* 183* 173*    Microbiology: Results for orders placed or performed during the hospital encounter of 04/18/23  Resp panel by RT-PCR (RSV, Flu A&B, Covid) Anterior Nasal Swab     Status: None   Collection Time: 04/18/23  3:44 PM   Specimen: Anterior Nasal Swab  Result Value Ref Range Status   SARS Coronavirus 2 by RT PCR NEGATIVE NEGATIVE Final    Comment: (NOTE) SARS-CoV-2 target nucleic acids are NOT DETECTED.  The SARS-CoV-2 RNA is generally detectable in upper respiratory specimens during the acute phase of infection. The lowest concentration of SARS-CoV-2 viral copies this assay can detect is 138 copies/mL. A negative result does not preclude SARS-Cov-2  infection and should not be used as the sole basis for treatment or other patient management decisions. A negative result may occur with  improper specimen collection/handling, submission of specimen other than nasopharyngeal swab, presence of viral mutation(s) within the areas targeted by this assay, and inadequate number of viral copies(<138 copies/mL). A negative result must be combined with clinical observations, patient history, and epidemiological information. The expected result is Negative.  Fact Sheet for Patients:  BloggerCourse.com  Fact Sheet for Healthcare Providers:   SeriousBroker.it  This test is no t yet approved or cleared by the [place] FDA and  has been authorized for detection and/or diagnosis of SARS-CoV-2 by FDA under an Emergency Use Authorization (EUA). This EUA will remain  in effect (meaning this test can be used) for the duration of the COVID-19 declaration under Section 564(b)(1) of the Act, 21 U.S.C.section 360bbb-3(b)(1), unless the authorization is terminated  or revoked sooner.       Influenza A by PCR NEGATIVE NEGATIVE Final   Influenza B by PCR NEGATIVE NEGATIVE Final    Comment: (NOTE) The Xpert Xpress SARS-CoV-2/FLU/RSV plus assay is intended as an aid in the diagnosis of influenza from Nasopharyngeal swab specimens and should not be used as a sole basis for treatment. Nasal washings and aspirates are unacceptable for Xpert Xpress SARS-CoV-2/FLU/RSV testing.  Fact Sheet for Patients: BloggerCourse.com  Fact Sheet for Healthcare Providers: SeriousBroker.it  This test is not yet approved or cleared by the [place] FDA and has been authorized for detection and/or diagnosis of SARS-CoV-2 by FDA under an Emergency Use Authorization (EUA). This EUA will remain in effect (meaning this test can be used) for the duration of the COVID-19 declaration under Section 564(b)(1) of the Act, 21 U.S.C. section 360bbb-3(b)(1), unless the authorization is terminated or revoked.     Resp Syncytial Virus by PCR NEGATIVE NEGATIVE Final    Comment: (NOTE) Fact Sheet for Patients: BloggerCourse.com  Fact Sheet for Healthcare Providers: SeriousBroker.it  This test is not yet approved or cleared by the [place] FDA and has been authorized for detection and/or diagnosis of SARS-CoV-2 by FDA under an Emergency Use Authorization (EUA). This EUA will remain in effect (meaning this test can be used) for  the duration of the COVID-19 declaration under Section 564(b)(1) of the Act, 21 U.S.C. section 360bbb-3(b)(1), unless the authorization is terminated or revoked.  Performed at [facility] [address] [place], [place] [address]   Blood culture (routine x 2)     Status: None   Collection Time: 04/18/23  5:30 PM   Specimen: BLOOD  Result Value Ref Range Status   Specimen Description BLOOD BLOOD LEFT FOREARM  Final   Special Requests   Final    BOTTLES DRAWN AEROBIC AND ANAEROBIC Blood Culture results may not be optimal due to an inadequate volume of blood received in culture bottles   Culture   Final    NO GROWTH 5 DAYS Performed at [facility], [address] [place], [place] [address]    Report Status 04/23/2023 FINAL  Final  Blood culture (routine x 2)     Status: None   Collection Time: 04/18/23  7:42 PM   Specimen: BLOOD  Result Value Ref Range Status   Specimen Description BLOOD BLOOD RIGHT HAND  Final   Special Requests   Final    BOTTLES DRAWN AEROBIC AND ANAEROBIC Blood Culture adequate volume   Culture   Final    NO GROWTH 5 DAYS  Performed at [facility], [address] [place], [place] [address]    Report Status 04/23/2023 FINAL  Final  Expectorated Sputum Assessment w Gram Stain, Rflx to Resp Cult     Status: None   Collection Time: 04/18/23  8:32 PM   Specimen: Sputum  Result Value Ref Range Status   Specimen Description SPUTUM  Final   Special Requests NONE  Final   Sputum evaluation   Final    THIS SPECIMEN IS ACCEPTABLE FOR SPUTUM CULTURE Performed at [facility], [address] [place], [place] [address]    Report Status 04/18/2023 FINAL  Final  Culture, Respiratory w Gram Stain     Status: None   Collection Time: 04/18/23  8:32 PM   Specimen: SPU  Result Value Ref Range Status   Specimen Description   Final    SPUTUM Performed at [facility], [address], [place], [place] [address]     Special Requests   Final    NONE Reflexed from [phone] Performed at [facility], [address] [place], [place] [address]    Gram Stain   Final    FEW SQUAMOUS EPITHELIAL CELLS PRESENT FEW WBC PRESENT,BOTH PMN AND MONONUCLEAR FEW YEAST FEW GRAM NEGATIVE RODS FEW GRAM POSITIVE RODS FEW GRAM POSITIVE COCCI IN PAIRS Performed at [facility], [address] [place], [place] [address]    Culture   Final    ABUNDANT PROVIDENCIA RETTGERI ABUNDANT PROTEUS MIRABILIS    Report Status 04/21/2023 FINAL  Final   Organism ID, Bacteria PROVIDENCIA RETTGERI  Final   Organism ID, Bacteria PROTEUS MIRABILIS  Final      Susceptibility   Proteus mirabilis - MIC*    AMPICILLIN <=2 SENSITIVE Sensitive     CEFEPIME <=0.12 SENSITIVE Sensitive     CEFTAZIDIME <=1 SENSITIVE Sensitive     CEFTRIAXONE <=0.25 SENSITIVE Sensitive     CIPROFLOXACIN <=0.25 SENSITIVE Sensitive     GENTAMICIN <=1 SENSITIVE Sensitive     IMIPENEM 2 SENSITIVE Sensitive     TRIMETH/SULFA <=20 SENSITIVE Sensitive     AMPICILLIN/SULBACTAM <=2 SENSITIVE Sensitive     PIP/TAZO <=4 SENSITIVE Sensitive ug/mL    * ABUNDANT PROTEUS MIRABILIS   Providencia rettgeri - MIC*    AMPICILLIN >=32 RESISTANT Resistant     CEFEPIME <=0.12 SENSITIVE Sensitive     CEFTAZIDIME <=1 SENSITIVE Sensitive     CEFTRIAXONE <=0.25 SENSITIVE Sensitive     CIPROFLOXACIN <=0.25 SENSITIVE Sensitive     GENTAMICIN <=1 SENSITIVE Sensitive     IMIPENEM 2 SENSITIVE Sensitive     TRIMETH/SULFA <=20 SENSITIVE Sensitive     AMPICILLIN/SULBACTAM 16 INTERMEDIATE Intermediate     PIP/TAZO <=4 SENSITIVE Sensitive ug/mL    * ABUNDANT PROVIDENCIA RETTGERI  MRSA Next Gen by PCR, Nasal     Status: None   Collection Time: 04/19/23 12:52 AM   Specimen: Nasal Mucosa; Nasal Swab  Result Value Ref Range Status   MRSA by PCR Next Gen NOT DETECTED NOT DETECTED Final    Comment: (NOTE) The GeneXpert MRSA Assay (FDA approved for NASAL specimens only), is  one component of a comprehensive MRSA colonization surveillance program. It is not intended to diagnose MRSA infection nor to guide or monitor treatment for MRSA infections. Test performance is not FDA approved in patients less than 85 years old. Performed at [facility], [address] [place], [place] [address]   Culture, blood (Routine X 2) w Reflex to ID Panel  Status: None (Preliminary result)   Collection Time: 04/22/23 12:27 PM   Specimen: BLOOD  Result Value Ref Range Status   Specimen Description BLOOD BLOOD LEFT HAND  Final   Special Requests   Final    BOTTLES DRAWN AEROBIC AND ANAEROBIC Blood Culture adequate volume   Culture   Final    NO GROWTH 4 DAYS Performed at [facility], [address], [place], [place] [address]    Report Status PENDING  Incomplete  Culture, blood (Routine X 2) w Reflex to ID Panel     Status: None (Preliminary result)   Collection Time: 04/22/23 12:37 PM   Specimen: BLOOD  Result Value Ref Range Status   Specimen Description BLOOD BLOOD RIGHT HAND  Final   Special Requests   Final    BOTTLES DRAWN AEROBIC AND ANAEROBIC Blood Culture adequate volume   Culture   Final    NO GROWTH 4 DAYS Performed at [facility], [address], [place], [place] [address]    Report Status PENDING  Incomplete  Culture, Respiratory w Gram Stain     Status: None (Preliminary result)   Collection Time: 04/22/23  1:22 PM   Specimen: Tracheal Aspirate  Result Value Ref Range Status   Specimen Description   Final    TRACHEAL ASPIRATE Performed at [facility], [address], [place], [place] [address]    Special Requests   Final    NONE Performed at [facility], [address] [place], [place] [address]    Gram Stain   Final    ABUNDANT SQUAMOUS EPITHELIAL CELLS PRESENT ABUNDANT WBC PRESENT, PREDOMINANTLY PMN FEW GRAM NEGATIVE RODS RARE GRAM POSITIVE COCCI IN PAIRS    Culture   Final    MODERATE  PSEUDOMONAS AERUGINOSA MODERATE PROTEUS MIRABILIS SUSCEPTIBILITIES TO FOLLOW CULTURE REINCUBATED FOR BETTER GROWTH Performed at [facility], [address], [place], [place] [address]    Report Status PENDING  Incomplete  Resp panel by RT-PCR (RSV, Flu A&B, Covid) Anterior Nasal Swab     Status: None   Collection Time: 04/22/23  5:53 PM   Specimen: Anterior Nasal Swab  Result Value Ref Range Status   SARS Coronavirus 2 by RT PCR NEGATIVE NEGATIVE Final    Comment: (NOTE) SARS-CoV-2 target nucleic acids are NOT DETECTED.  The SARS-CoV-2 RNA is generally detectable in upper respiratory specimens during the acute phase of infection. The lowest concentration of SARS-CoV-2 viral copies this assay can detect is 138 copies/mL. A negative result does not preclude SARS-Cov-2 infection and should not be used as the sole basis for treatment or other patient management decisions. A negative result may occur with  improper specimen collection/handling, submission of specimen other than nasopharyngeal swab, presence of viral mutation(s) within the areas targeted by this assay, and inadequate number of viral copies(<138 copies/mL). A negative result must be combined with clinical observations, patient history, and epidemiological information. The expected result is Negative.  Fact Sheet for Patients:  BloggerCourse.com  Fact Sheet for Healthcare Providers:  SeriousBroker.it  This test is no t yet approved or cleared by the [place] FDA and  has been authorized for detection and/or diagnosis of SARS-CoV-2 by FDA under an Emergency Use Authorization (EUA). This EUA will remain  in effect (meaning this test can be used) for the duration of the COVID-19 declaration under Section 564(b)(1) of the Act, 21 U.S.C.section 360bbb-3(b)(1), unless the authorization is terminated  or revoked sooner.  Influenza A by PCR NEGATIVE  NEGATIVE Final   Influenza B by PCR NEGATIVE NEGATIVE Final    Comment: (NOTE) The Xpert Xpress SARS-CoV-2/FLU/RSV plus assay is intended as an aid in the diagnosis of influenza from Nasopharyngeal swab specimens and should not be used as a sole basis for treatment. Nasal washings and aspirates are unacceptable for Xpert Xpress SARS-CoV-2/FLU/RSV testing.  Fact Sheet for Patients: BloggerCourse.com  Fact Sheet for Healthcare Providers: SeriousBroker.it  This test is not yet approved or cleared by the [place] FDA and has been authorized for detection and/or diagnosis of SARS-CoV-2 by FDA under an Emergency Use Authorization (EUA). This EUA will remain in effect (meaning this test can be used) for the duration of the COVID-19 declaration under Section 564(b)(1) of the Act, 21 U.S.C. section 360bbb-3(b)(1), unless the authorization is terminated or revoked.     Resp Syncytial Virus by PCR NEGATIVE NEGATIVE Final    Comment: (NOTE) Fact Sheet for Patients: BloggerCourse.com  Fact Sheet for Healthcare Providers: SeriousBroker.it  This test is not yet approved or cleared by the [place] FDA and has been authorized for detection and/or diagnosis of SARS-CoV-2 by FDA under an Emergency Use Authorization (EUA). This EUA will remain in effect (meaning this test can be used) for the duration of the COVID-19 declaration under Section 564(b)(1) of the Act, 21 U.S.C. section 360bbb-3(b)(1), unless the authorization is terminated or revoked.  Performed at [facility], [address], [place], [place] [address]   Urine Culture (for pregnant, neutropenic or urologic patients or patients with an indwelling urinary catheter)     Status: None   Collection Time: 04/24/23  7:35 PM   Specimen: Urine, Random  Result Value Ref Range Status   Specimen Description   Final     URINE, RANDOM Performed at [facility], [address], [place], [place] [address]    Special Requests   Final    NONE Performed at [facility], [address] [place], [place] [address]    Culture   Final    NO GROWTH Performed at [facility], [address] [place], [place] [address]    Report Status 04/25/2023 FINAL  Final    Coagulation Studies: No results for input(s): "LABPROT", "INR" in the last 72 hours.  Urinalysis: Recent Labs    04/24/23 1935  COLORURINE YELLOW*  LABSPEC 1.020  PHURINE 5.0  GLUCOSEU NEGATIVE  HGBUR SMALL*  BILIRUBINUR NEGATIVE  KETONESUR NEGATIVE  PROTEINUR 100*  NITRITE NEGATIVE  LEUKOCYTESUR NEGATIVE      Imaging: US RENAL Result Date: 04/25/2023 CLINICAL DATA:  [id number] AKI (acute kidney injury) [facility]) [id number] EXAM: RENAL / URINARY TRACT ULTRASOUND COMPLETE COMPARISON:  April 24, 2023 FINDINGS: Evaluation is limited by inability to optimally position patient or hold breath. Right Kidney: Renal measurements: 12.5 x 6.6 x 6.9 cm = volume: 297 mL. Echogenicity is the upper limits of normal. No suspicious mass or hydronephrosis visualized. Benign cyst is incidentally noted measuring up to 2.9 cm (for which no dedicated imaging follow-up is recommended). Left Kidney: Renal measurements: 11.4 x 8.4 x 7.3 cm = volume: 363 mL. Echogenicity is the upper limits of normal. No mass or hydronephrosis visualized. Bladder: Appears normal for degree of bladder distention. Prevoid volume 108 ML. Other: None. IMPRESSION: No hydronephrosis. Electronically Signed   By: Meda Klinefelter M.D.   On: 04/25/2023 13:13   CT CHEST ABDOMEN PELVIS WO CONTRAST Result Date: 04/24/2023 CLINICAL DATA:  Sepsis EXAM: CT  CHEST, ABDOMEN AND PELVIS WITHOUT CONTRAST TECHNIQUE: Multidetector CT imaging of the chest, abdomen and pelvis was performed following the standard protocol without IV contrast. RADIATION DOSE REDUCTION: This exam was performed  according to the departmental dose-optimization program which includes automated exposure control, adjustment of the mA and/or kV according to patient size and/or use of iterative reconstruction technique. COMPARISON:  Chest x-ray 04/22/2023, CT 05/17/2022, 08/24/2021 FINDINGS: CT CHEST FINDINGS Cardiovascular: Limited assessment without intravenous contrast. Maximum ascending aortic diameter of 4 cm. Cardiomegaly. No pericardial effusion. Mediastinum/Nodes: Tracheostomy tube tip about 3.5 cm superior to the carina. No suspicious lymph nodes. Esophagus within normal limits Lungs/Pleura: Small bilateral pleural effusions. Hazy perihilar densities and mild septal thickening. Confluent consolidation in the left lower lobe with partial right lower lobe consolidation. Musculoskeletal: No acute osseous abnormality.  Gynecomastia CT ABDOMEN PELVIS FINDINGS Hepatobiliary: No focal liver abnormality is seen. No gallstones, gallbladder wall thickening, or biliary dilatation. Pancreas: Unremarkable. No pancreatic ductal dilatation or surrounding inflammatory changes. Spleen: Normal in size without focal abnormality. Adrenals/Urinary Tract: Adrenal glands are normal. Kidneys show no hydronephrosis. The bladder is normal Stomach/Bowel: Gastrostomy tube in the stomach. Enteral contrast in the stomach and small bowel with dilute contrast in the colon. No acute bowel wall thickening. Negative appendix. Vascular/Lymphatic: Aortic atherosclerosis. No enlarged abdominal or pelvic lymph nodes. Reproductive: Negative prostate Other: Negative for pelvic effusion or free air Musculoskeletal: No acute or suspicious osseous abnormality IMPRESSION: 1. Cardiomegaly with small bilateral pleural effusions and mild septal thickening and bilateral perihilar ground-glass density suggesting a degree of pulmonary edema. 2. Confluent consolidation in the left lower lobe with partial right lower lobe consolidation, findings are suspicious for  pneumonia or aspiration. 3. No CT evidence for acute intra-abdominal or pelvic abnormality. 4. Aortic atherosclerosis. Aortic Atherosclerosis (ICD10-I70.0). Electronically Signed   By: Jasmine Pang M.D.   On: 04/24/2023 17:42     Medications:    feeding supplement (NEPRO CARB STEADY) 1,000 mL (04/26/23 1304)   piperacillin-tazobactam (ZOSYN)  IV 3.375 g (04/26/23 0550)    apixaban  5 mg Per Tube BID   atropine  2 drop Sublingual QID   budesonide (PULMICORT) nebulizer solution  0.25 mg Nebulization BID   carvedilol  6.25 mg Per Tube BID WC   clonazePAM  0.5 mg Per Tube BID   famotidine  20 mg Per Tube BID   [START ON 04/27/2023] feeding supplement (PROSource TF20)  60 mL Per Tube Daily   free water  200 mL Per Tube Q6H   glycopyrrolate  1 mg Per Tube TID   insulin aspart  0-9 Units Subcutaneous Q4H   insulin glargine-yfgn  10 Units Subcutaneous QHS   ipratropium-albuterol  3 mL Nebulization TID   levETIRAcetam  750 mg Per Tube BID   metoCLOPramide  10 mg Per Tube Q8H   nutrition supplement (JUVEN)  1 packet Per Tube BID BM   oxyCODONE  2.5 mg Per Tube Q8H   sodium zirconium cyclosilicate  10 g Per Tube Daily   valproic acid  750 mg Per Tube TID   acetaminophen (TYLENOL) oral liquid 160 mg/5 mL, albuterol, guaiFENesin, iohexol, LORazepam, ondansetron (ZOFRAN) IV, polyethylene glycol  Assessment/ Plan:  Mr. Billy Cherry is a 53 y.o.  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  Acute kidney injury Imaging-no hydronephrosis noted. Urinalysis-04/24/2023-small hemoglobin 11-20 RBCs, rare WBCs Likely ATN secondary to concurrent illness, pneumonia. Patient is nonoliguric. Adequate urine output recorded in preceding 24 hours.  Creatinine slightly worse today.  Will continue to monitor.  No acute indication for dialysis.  Lab Results  Component Value Date   CREATININE 3.48 (H) 04/26/2023   CREATININE 3.30 (H) 04/25/2023   CREATININE 2.91 (H) 04/24/2023    Intake/Output Summary (Last 24 hours) at 04/26/2023 1309 Last data filed at 04/26/2023 0700 Gross per 24 hour  Intake 2409.42 ml  Output 1125 ml  Net 1284.42 ml    2.  Hyperkalemia Potassium remains elevated, 5.6.  Recommend transitioning tube feeds to low potassium, Nepro.  Primary team is ordered Lokelma.   LOS: 8 Jemila Camille 2/21/20251:09 PM

## 2023-04-26 NOTE — Plan of Care (Signed)
Patient across from nursing station for closer monitoring as patient is nonverbal Problem: Coping: Goal: Ability to adjust to condition or change in health will improve Outcome: Progressing   Problem: Fluid Volume: Goal: Ability to maintain a balanced intake and output will improve Outcome: Progressing   Problem: Education: Goal: Ability to describe self-care measures that may prevent or decrease complications (Diabetes Survival Skills Education) will improve 04/26/2023 1147 by Collene Gobble, RN Outcome: Not Progressing 04/26/2023 1146 by Collene Gobble, RN Outcome: Progressing Goal: Individualized Educational Video(s) 04/26/2023 1147 by Collene Gobble, RN Outcome: Not Progressing 04/26/2023 1146 by Collene Gobble, RN Outcome: Progressing   Problem: Health Behavior/Discharge Planning: Goal: Ability to identify and utilize available resources and services will improve Outcome: Not Progressing

## 2023-04-26 NOTE — Progress Notes (Signed)
Progress Note   Patient: Billy Cherry ZOX:[id number] DOB: 28-Jan-1971 DOA: 04/18/2023     8 DOS: the patient was seen and examined on 04/26/2023   Brief hospital course: 53 y.o. 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   Patient is nonverbal.  Per her wife at the bedside, patient has increased shortness of breath with increased tracheal secretion in the past several days.  No fever or chills.  Patient has increased fluid retention, particularly in arms per her wife.  Patient does not see to have chest pain, nausea, vomiting, abdominal pain per his wife.  Patient was found to have oxygen desaturation to 88% on room air, which improved to 99% of 4L -6L oxygen in ED.    Data reviewed independently and ED Course: pt was found to have BNP 349, potassium 5.8, GFR> 60, WBC 17.1, negative PCR for flu, COVID and RSV, troponin 13 --> 15, procalcitonin 0.17.  Chest x-ray showed bilateral basilar opacity (the left is worse than the right).  Temperature 99.1, blood pressure 125/100, heart rate of 108, RR 22.  Patient is admitted to PCU as inpatient.  2/14.  Patient on 40% trach collar.  Patient on IV Lasix to remove fluid and IV antibiotics for pneumonia. 2/15.  Creatinine went up to 1.6 will decrease Lasix down to 20 mg IV twice daily.  Switch tube feedings over to Glucerna that he takes at home. Dietitian to adjust rate to goal and free water. 2/16.  Creatinine up at 1.66.  Will hold Lasix for the rest of today and recheck creatinine tomorrow.  Try to get patient down to his baseline oxygen.  Echocardiogram shows an EF of 30 to 35% stable from previous. 2/17.  Creatinine up at 1.85.  Patient spiked a fever of 102.  Will get blood cultures and change antibiotics to Zithromax and Zosyn.   Chest x-ray showing left lower lobe pneumonia.  Repeat COVID RSV and influenza negative. 2/18.  Creatinine up at 2.22.  Maintenance fluids started.  Added vancomycin. 2/19: Creatinine continued to get worse, currently at 2.91 with BUN of 96, IV Lasix has been discontinued and he was getting some IV fluid.  No significant volume on bladder scan.  Continue to have fever, maximum recorded at 101.8 over the past 24-hour.  MRSA PCR was negative so vancomycin is being discontinued.  Blood cultures from 12/17 remain negative.  Repeat sputum cultures pending consulting infectious disease, CT chest, abdomen and pelvis, UA with urine culture was ordered. EEG with severe diffuse encephalopathy, no seizure or definitive epileptiform discharges were noted. 2/20: Afebrile this morning but maximum temperature recorded was 100.6 over the past 24-hour.  Worsening renal function, UA with protein urea and rare bacteria, urine cultures pending, CT chest, abdomen and pelvis with cardiomegaly, small bilateral pleural effusion and mild septal thickening, bilateral perihilar groundglass density suggestive of some pulmonary edema.  Also has a confluent consolidation in the left lower lobe and partial right lower lobe suspicious for pneumonia or aspiration. No evidence of any acute intra-abdominal abnormality. Stopping gentle IV fluid and ordered 1 dose of IV Lasix 40 mg Renal ultrasound with some renal echogenicity, no hydronephrosis. Also consulted nephrology 2/21: Remained afebrile, worsening hyperkalemia with potassium at 5.6, worsening renal function with BUN 126 and creatinine 3.48, urine cultures negative, repeat sputum cultures with  Pseudomonas and Proteus-continuing Zosyn.  Ordered some Lokelma, nephrology is recommending switching Glucerna with Nepro due to high potassium content.  Need to monitor potassium very closely, patient might require dialysis if renal function and potassium continue to get worse.  Patient with  significant life limiting comorbidities, seems like multiorgan dysfunction, palliative care was again offered but wife declined.   Assessment and Plan: * Multifocal pneumonia Patient initially placed on vancomycin Maxipime and Flagyl.  Since MRSA PCR is negative, vancomycin was discontinued.  Initial sputum culture grew Proteus and providencia, which were treated appropriately. Patient had a fever of 102 on 2/17.  Antibiotics switched to Zosyn, Zithromax and added vancomycin back.  Blood cultures so far negative.  Repeat COVID influenza and RSV negative.  Sputum culture growing Pseudomonas and Proteus -As he continued to have fever spikes-ID was consulted -Repeat blood cultures remain negative, sputum cultures pending.  Urine cultures pending, UA with proteinuria and few bacteria only.  CT chest abdomen and pelvis with concern of bilateral pneumonia. -Vancomycin was discontinued  -Follow-up ID recommendations -Continue with Zosyn until 04/28/2023 as recommended by ID  Acute on chronic systolic CHF (congestive heart failure) ([name]) Gentle fluids started with worsening kidney function.  Patient on tube feedings.  Continue Coreg.  Repeat echo showed a stable EF of 30 to 35%.  Weight on 2/13 was 263 pounds and not sure how accurate that weight is.  Weight on 2/16 was 232.37.  Today's weight 248.46.  Continue to monitor. -Received 1 dose of IV Lasix yesterday for concern of pulmonary edema on CT chest, further diuresis is being held due to worsening renal function as recommended by nephrology  Acute kidney injury superimposed on CKD ([name]) AKI on CKD stage II.  Worsening creatinine despite giving some gentle IV fluid, and trial of diuresis at 3.48 today.CT chest with concern of some pulmonary edema.  No significant urinary retention and renal ultrasound without any hydronephrosis. Nephrology consult -Monitor renal function -Strict intake and output -Avoid nephrotoxins  HCAP (healthcare-associated  pneumonia)-resolved as of 04/19/2023 Patient started on Maxipime and Flagyl.  Vancomycin was discontinued with MRSA PE CR being negative  Acute on chronic hypoxic respiratory failure ([name]) Patient normally wears 6 L.  1 pulse ox of 88%.  Currently on 40% FiO2 and 6 L of oxygen through trach collar which is at his baseline.  Hyperkalemia Potassium started trending up after initial improvement, today at 5.6-despite getting another dose of IV Lasix yesterday -Ordered Lokelma -Switch dietary formula from Glucerna to Nepro -Continue to monitor  Seizure disorder ([name]) Continue Keppra and Depakote.  Will get an EEG.  Essential hypertension Continue Coreg  Pulmonary embolus ([name]) On Eliquis  DVT (deep venous thrombosis) ([name]) On Eliquis  S/P percutaneous endoscopic gastrostomy (PEG) tube placement ([name]) Continue Glucerna tube feedings.  Tracheostomy dependent ([name]) On 40% oxygen, 6 L flow.  Arterial hypotension Hypotension resolved  Iron deficiency anemia Ferritin 53 and last hemoglobin 9.1.  Anoxic encephalopathy ([name]) Supportive care      Subjective: Patient remained minimally responsive which is his baseline.  Clinically appears more comfortable with less upper respiratory secretions.  Physical Exam: Vitals:   04/26/23 0700 04/26/23 0800 04/26/23 1200 04/26/23 1205  BP:  (!) 112/94 (!) 123/100   Pulse:  (!) 102 (!) 103   Resp:  (!) 21 17   Temp: (!) 97.3 F (36.3 C)   98.2 F (36.8 C)  TempSrc:    Oral  SpO2:  97% 96%   Weight:  Height:       General.  Obese gentleman, in no acute distress.  Trach and PEG in place. Pulmonary.  Lungs clear bilaterally, normal respiratory effort. CV.  Regular rate and rhythm, no JVD, rub or murmur. Abdomen.  Soft, nontender, nondistended, BS positive. CNS.  Awake but does not follow any commands Extremities.  No edema, no cyanosis, pulses intact and symmetrical.  Data Reviewed: Prior data reviewed  Family Communication:  Talked with wife on phone.  Disposition: Status is: Inpatient Remains inpatient appropriate because: Severity of illness  Planned Discharge Destination: Home with Home Health  DVT prophylaxis.  Eliquis Time spent: 50 minutes  This record has been created using Conservation officer, historic buildings. Errors have been sought and corrected,but may not always be located. Such creation errors do not reflect on the standard of care.   Author: [name], MD 04/26/2023 2:37 PM  For on call review [email or web address].

## 2023-04-26 NOTE — Progress Notes (Signed)
In to see pt and medicated pt @0146  and @0147 , left pt room and CNA stated that the vistor in pt room wanted pt pulled up and reposition because O2 was low. Went in pt room wife at bedside pt O2 went from 88 to 91%. Spoke to wife told her she must have just arrived because I had just walked out. Nurse and CNA pulled pt up in bed and repositioned pt off his back. Pt wife upset stating pt O2 had been low for 7 to 8 minutes, which it could not have been I had not been out the room 7 to 8 minutes and the visitor had not been in the room since I took over pt care @ 2315. Pt had just received schedule pain meds. Called respiratory therapist to suction pt she was on another floor nurse Mindy RN came in to help and she suctioned pt she got nothing out. I went to get a new pulse OX for pt finger, respiratory therapist in to see pt. Staff pulled pt up again and removed pillow from pt back to put him back in the position he was in prior to request to reposition. Respiratory therapist suctioned pt with minimal output, pulse Ox changed pt sating 92-95%, telemetry notified to note if pt O2 drops below 92% to notify nurse taking care of pt.

## 2023-04-26 NOTE — Assessment & Plan Note (Addendum)
 Patient with slowly worsening hypoxia and now started getting apneic spells.  No significant secretions on suctioning.  Currently on 10 L with FiO2 of 70%, baseline of 6 L Case was discussed with PCCM and patient is being transferred to ICU as he will be high risk for ventilatory support. -ABG and repeat lactic acid ordered -Further respiratory management per ICU

## 2023-04-27 DIAGNOSIS — G931 Anoxic brain damage, not elsewhere classified: Secondary | ICD-10-CM | POA: Diagnosis not present

## 2023-04-27 DIAGNOSIS — I5043 Acute on chronic combined systolic (congestive) and diastolic (congestive) heart failure: Secondary | ICD-10-CM

## 2023-04-27 DIAGNOSIS — I509 Heart failure, unspecified: Secondary | ICD-10-CM | POA: Diagnosis not present

## 2023-04-27 DIAGNOSIS — J189 Pneumonia, unspecified organism: Secondary | ICD-10-CM | POA: Diagnosis not present

## 2023-04-27 DIAGNOSIS — A419 Sepsis, unspecified organism: Secondary | ICD-10-CM | POA: Diagnosis not present

## 2023-04-27 DIAGNOSIS — J9621 Acute and chronic respiratory failure with hypoxia: Secondary | ICD-10-CM | POA: Diagnosis not present

## 2023-04-27 DIAGNOSIS — N179 Acute kidney failure, unspecified: Secondary | ICD-10-CM | POA: Diagnosis not present

## 2023-04-27 DIAGNOSIS — E875 Hyperkalemia: Secondary | ICD-10-CM | POA: Diagnosis not present

## 2023-04-27 LAB — BLOOD GAS, VENOUS
Acid-Base Excess: 6.7 mmol/L — ABNORMAL HIGH (ref 0.0–2.0)
Bicarbonate: 33.3 mmol/L — ABNORMAL HIGH (ref 20.0–28.0)
O2 Saturation: 94.9 %
Patient temperature: 37
pCO2, Ven: 55 mm[Hg] (ref 44–60)
pH, Ven: 7.39 (ref 7.25–7.43)
pO2, Ven: 67 mm[Hg] — ABNORMAL HIGH (ref 32–45)

## 2023-04-27 LAB — BASIC METABOLIC PANEL
Anion gap: 13 (ref 5–15)
Anion gap: 16 — ABNORMAL HIGH (ref 5–15)
BUN: 138 mg/dL — ABNORMAL HIGH (ref 6–20)
BUN: 144 mg/dL — ABNORMAL HIGH (ref 6–20)
CO2: 28 mmol/L (ref 22–32)
CO2: 27 mmol/L (ref 22–32)
Calcium: 8.3 mg/dL — ABNORMAL LOW (ref 8.9–10.3)
Calcium: 8.5 mg/dL — ABNORMAL LOW (ref 8.9–10.3)
Chloride: 99 mmol/L (ref 98–111)
Chloride: 99 mmol/L (ref 98–111)
Creatinine, Ser: 3.59 mg/dL — ABNORMAL HIGH (ref 0.61–1.24)
Creatinine, Ser: 3.62 mg/dL — ABNORMAL HIGH (ref 0.61–1.24)
GFR, Estimated: 20 mL/min — ABNORMAL LOW (ref >60)
GFR, Estimated: 19 mL/min — ABNORMAL LOW (ref >60)
Glucose, Bld: 200 mg/dL — ABNORMAL HIGH (ref 70–99)
Glucose, Bld: 216 mg/dL — ABNORMAL HIGH (ref 70–99)
Potassium: 5.3 mmol/L — ABNORMAL HIGH (ref 3.5–5.1)
Potassium: 5 mmol/L (ref 3.5–5.1)
Sodium: 140 mmol/L (ref 135–145)
Sodium: 142 mmol/L (ref 135–145)

## 2023-04-27 LAB — LACTIC ACID, PLASMA
Lactic Acid, Venous: 1.5 mmol/L (ref 0.5–1.9)
Lactic Acid, Venous: 1.4 mmol/L (ref 0.5–1.9)

## 2023-04-27 LAB — CULTURE, BLOOD (ROUTINE X 2)
Culture: NO GROWTH
Culture: NO GROWTH
Special Requests: ADEQUATE
Special Requests: ADEQUATE

## 2023-04-27 LAB — CULTURE, RESPIRATORY W GRAM STAIN

## 2023-04-27 LAB — GLUCOSE, CAPILLARY
Glucose-Capillary: 186 mg/dL — ABNORMAL HIGH (ref 70–99)
Glucose-Capillary: 196 mg/dL — ABNORMAL HIGH (ref 70–99)
Glucose-Capillary: 251 mg/dL — ABNORMAL HIGH (ref 70–99)

## 2023-04-27 LAB — MRSA NEXT GEN BY PCR, NASAL: MRSA by PCR Next Gen: NOT DETECTED

## 2023-04-27 MED ORDER — CHLORHEXIDINE GLUCONATE CLOTH 2 % EX PADS
6.0000 | MEDICATED_PAD | Freq: Every day | CUTANEOUS | Status: DC
  Administered 2023-04-27 – 2023-05-01 (×5): 6 via TOPICAL

## 2023-04-27 MED ORDER — FUROSEMIDE 10 MG/ML IJ SOLN
4.0000 mg/h | INTRAVENOUS | Status: DC
  Administered 2023-04-27 – 2023-04-30 (×3): 4 mg/h via INTRAVENOUS
  Filled 2023-04-27 (×3): qty 20

## 2023-04-27 MED ORDER — SODIUM CHLORIDE 0.9 % IV SOLN
2.0000 g | Freq: Two times a day (BID) | INTRAVENOUS | Status: DC
  Administered 2023-04-27 – 2023-04-29 (×4): 2 g via INTRAVENOUS
  Filled 2023-04-27 (×4): qty 2

## 2023-04-27 NOTE — Procedures (Addendum)
 PROCEDURE: BRONCHOSCOPY Therapeutic Aspiration of Tracheobronchial Tree  PROCEDURE DATE: 04/27/2023  TIME:  NAME:  Apollos Tenbrink  DOB:Sep 18, 1970  MRN: [id number] LOC:  IC16A/IC16A-AA    HOSP DAY: @LENGTHOFSTAYDAYS @ CODE STATUS:      Code Status Orders  (From admission, onward)           Start     Ordered   04/18/23 2005  Full code  Continuous       Question:  By:  Answer:  Consent: discussion documented in EHR   04/18/23 2005           Code Status History     Date Active Date Inactive Code Status Order ID Comments User Context   05/30/2022 0547 06/05/2022 1841 Full Code [id number]  Hannah Beat, MD ED   05/17/2022 1426 05/23/2022 1658 Full Code [id number]  Verdene Lennert, MD ED   01/06/2022 0101 01/24/2022 0318 Full Code [id number]  Venora Maples, MD ED   10/04/2021 2133 10/24/2021 1844 Full Code [id number]  Charlsie Quest, MD ED   03/30/2021 1635 10/04/2021 1547 Full Code [id number]  Eliezer Champagne, NP Inpatient   03/23/2021 0410 03/30/2021 1625 Full Code [id number]  Paraschos, Lyn Hollingshead, MD ED           Indications/Preliminary Diagnosis: Pneumonia  Consent: (Place X beside choice/s below)  The benefits, risks and possible complications of the procedure were        explained to:  ___ patient  _x__ patient's family  ___ other:___________  who verbalized understanding and gave:  ___ verbal  ___ written  _x__ verbal and written  ___ telephone  ___ other:________ consent.      Unable to obtain consent; procedure performed on emergent basis.     Other:       PRESEDATION ASSESSMENT: History and Physical has been performed. Patient meds and allergies have been reviewed. Presedation airway examination has been performed and documented. Baseline vital signs, sedation score, oxygenation status, and cardiac rhythm were reviewed. Patient was deemed to be in satisfactory condition to undergo the procedure.       PROCEDURE DETAILS: Timeout performed and correct patient,  name, & ID confirmed. Following prep per Pulmonary policy, appropriate sedation was administered. The Bronchoscope was inserted in to oral cavity with bite block in place. Therapeutic aspiration of Tracheobronchial tree was performed.  Airway exam proceeded with findings, technical procedures, and specimen collection as noted below. At the end of exam the scope was withdrawn without incident. Impression and Plan as noted below.         Insertion Route (Place X beside choice below)   Nasal   Oral   Endotracheal Tube  x Tracheostomy    TECHNICAL PROCEDURES: (Place X beside choice below)   Procedures  Description    None     Electrocautery     Cryotherapy     Balloon Dilatation     Bronchography     Stent Placement   x  Therapeutic Aspiration Thick mucoid secretions and purulant secretions extracted several times in all segments    Laser/Argon Plasma    Brachytherapy Catheter Placement    Foreign Body Removal         SPECIMENS (Sites): (Place X beside choice below)  Specimens Description   No Specimens Obtained     Washings   x Lavage Mucoid secretions, mucopururlant   Biopsies    Fine Needle Aspirates    Brushings    Sputum    FINDINGS: pneumonia ESTIMATED BLOOD  LOSS: none COMPLICATIONS/RESOLUTION: none  IMPRESSION:POST-PROCEDURE DX:  pneumonia   RECOMMENDATION/PLAN:  Oxygen and ABX Await cultures    Lucie Leather, M.D.  [facility]  Medical Director [facility] Medical Director [facility] Cardio-Pulmonary Department

## 2023-04-27 NOTE — Plan of Care (Signed)
  Problem: Metabolic: Goal: Ability to maintain appropriate glucose levels will improve Outcome: Progressing   Problem: Skin Integrity: Goal: Risk for impaired skin integrity will decrease Outcome: Progressing   Problem: Clinical Measurements: Goal: Ability to maintain clinical measurements within normal limits will improve Outcome: Progressing Goal: Will remain free from infection Outcome: Progressing Goal: Diagnostic test results will improve Outcome: Progressing Goal: Respiratory complications will improve Outcome: Progressing Goal: Cardiovascular complication will be avoided Outcome: Progressing   Problem: Activity: Goal: Risk for activity intolerance will decrease 04/27/2023 0626 by Glenford Peers, RN Outcome: Progressing 04/27/2023 0626 by Glenford Peers, RN Outcome: Progressing

## 2023-04-27 NOTE — Progress Notes (Signed)
 RT assisted with bedside bronchoscopy. Consent and time out were obtained and performed by the appropriate personnel  Before procedure. Ambu scope Lot #: [id number] used for procedure.   Time scope into airway: 1534 Time scope out of airway: 1540

## 2023-04-27 NOTE — Progress Notes (Signed)
 Progress Note   Patient: Aragorn Recker WUJ:[id number] DOB: 06-Aug-1970 DOA: 04/18/2023     9 DOS: the patient was seen and examined on 04/27/2023   Brief hospital course: 53 y.o. 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   Patient is nonverbal.  Per her wife at the bedside, patient has increased shortness of breath with increased tracheal secretion in the past several days.  No fever or chills.  Patient has increased fluid retention, particularly in arms per her wife.  Patient does not see to have chest pain, nausea, vomiting, abdominal pain per his wife.  Patient was found to have oxygen desaturation to 88% on room air, which improved to 99% of 4L -6L oxygen in ED.    Data reviewed independently and ED Course: pt was found to have BNP 349, potassium 5.8, GFR> 60, WBC 17.1, negative PCR for flu, COVID and RSV, troponin 13 --> 15, procalcitonin 0.17.  Chest x-ray showed bilateral basilar opacity (the left is worse than the right).  Temperature 99.1, blood pressure 125/100, heart rate of 108, RR 22.  Patient is admitted to PCU as inpatient.  2/14.  Patient on 40% trach collar.  Patient on IV Lasix to remove fluid and IV antibiotics for pneumonia. 2/15.  Creatinine went up to 1.6 will decrease Lasix down to 20 mg IV twice daily.  Switch tube feedings over to Glucerna that he takes at home. Dietitian to adjust rate to goal and free water. 2/16.  Creatinine up at 1.66.  Will hold Lasix for the rest of today and recheck creatinine tomorrow.  Try to get patient down to his baseline oxygen.  Echocardiogram shows an EF of 30 to 35% stable from previous. 2/17.  Creatinine up at 1.85.  Patient spiked a fever of 102.  Will get blood cultures and change antibiotics to Zithromax and Zosyn.   Chest x-ray showing left lower lobe pneumonia.  Repeat COVID RSV and influenza negative. 2/18.  Creatinine up at 2.22.  Maintenance fluids started.  Added vancomycin. 2/19: Creatinine continued to get worse, currently at 2.91 with BUN of 96, IV Lasix has been discontinued and he was getting some IV fluid.  No significant volume on bladder scan.  Continue to have fever, maximum recorded at 101.8 over the past 24-hour.  MRSA PCR was negative so vancomycin is being discontinued.  Blood cultures from 12/17 remain negative.  Repeat sputum cultures pending consulting infectious disease, CT chest, abdomen and pelvis, UA with urine culture was ordered. EEG with severe diffuse encephalopathy, no seizure or definitive epileptiform discharges were noted. 2/20: Afebrile this morning but maximum temperature recorded was 100.6 over the past 24-hour.  Worsening renal function, UA with protein urea and rare bacteria, urine cultures pending, CT chest, abdomen and pelvis with cardiomegaly, small bilateral pleural effusion and mild septal thickening, bilateral perihilar groundglass density suggestive of some pulmonary edema.  Also has a confluent consolidation in the left lower lobe and partial right lower lobe suspicious for pneumonia or aspiration. No evidence of any acute intra-abdominal abnormality. Stopping gentle IV fluid and ordered 1 dose of IV Lasix 40 mg Renal ultrasound with some renal echogenicity, no hydronephrosis. Also consulted nephrology 2/21: Remained afebrile, worsening hyperkalemia with potassium at 5.6, worsening renal function with BUN 126 and creatinine 3.48, urine cultures negative, repeat sputum cultures with  Pseudomonas and Proteus-continuing Zosyn.  Ordered some Lokelma, nephrology is recommending switching Glucerna with Nepro due to high potassium content.  Need to monitor potassium very closely, patient might require dialysis if renal function and potassium continue to get worse.  Patient with  significant life limiting comorbidities, seems like multiorgan dysfunction, palliative care was again offered but wife declined.  2/22: Patient with concern of worsening hypoxia requiring higher level of FiO2 and was placed on 10 L of oxygen.  Intermittently having apneic episodes.  Repeat chest x-ray again with concern of pulmonary vascular congestion, received 1 dose of IV Lasix 80 mg, renal function seems plateaued now.  After discussing with nephrology patient was started on Lasix infusion. Also discussed with PCCM and they want him to transfer to ICU for close monitoring as he is high risk to go on a ventilator. Ordered ABG and repeating lactic acid   Assessment and Plan: * Multifocal pneumonia   Initial sputum culture grew Proteus and providencia, which were treated appropriately. Patient had a fever of 102 on 2/17.  Antibiotics switched to Zosyn, Zithromax and added vancomycin back.  Blood cultures so far negative.  Repeat COVID influenza and RSV negative.  Sputum culture growing Pseudomonas and Proteus -As he continued to have fever spikes-ID was consulted -Repeat blood and urine cultures negative,  CT chest abdomen and pelvis with concern of bilateral pneumonia. -Vancomycin was discontinued as MRSA PCR was negative -Continue with Zosyn until 04/28/2023 as recommended by ID  Acute on chronic systolic CHF (congestive heart failure) ([name]) Gentle fluids started with worsening kidney function.  Patient on tube feedings.  Continue Coreg.  Repeat echo showed a stable EF of 30 to 35%.  Weight on 2/13 was 263 pounds and not sure how accurate that weight is.  Weight on 2/16 was 232.37.  Today's weight 248.46.  Continue to monitor. -Received 1 dose of IV Lasix yesterday for concern of pulmonary edema on CT chest, further diuresis is being held due to worsening renal function as recommended by nephrology  Acute kidney injury superimposed on CKD ([name]) AKI on CKD stage II.  Creatinine now at least  plateaued, currently at 3.59 .CT chest with concern of some pulmonary edema.  No significant urinary retention and renal ultrasound without any hydronephrosis. Nephrology consult -Patient was started on Lasix infusion for gentle diuresis -Monitor renal function -Strict intake and output -Avoid nephrotoxins  HCAP (healthcare-associated pneumonia)-resolved as of 04/19/2023 Patient started on Maxipime and Flagyl.  Vancomycin was discontinued with MRSA PE CR being negative  Acute on chronic hypoxic respiratory failure ([name]) Patient with slowly worsening hypoxia and now started getting apneic spells.  No significant secretions on suctioning.  Currently on 10 L with FiO2 of 70%, baseline of 6 L Case was discussed with PCCM and patient is being transferred to ICU as he will be high risk for ventilatory support. -ABG and repeat lactic acid ordered -Further respiratory management per ICU   Hyperkalemia Some improvement in potassium, still mildly elevated at 5.3 -Continue Lokelma -Switched dietary formula from Glucerna to Nepro -Continue to monitor  Seizure disorder ([name]) Continue Keppra and Depakote.  Will get an EEG.  Essential hypertension Continue Coreg  Pulmonary embolus ([name]) On Eliquis  DVT (deep venous thrombosis) ([name]) On Eliquis  S/P percutaneous endoscopic gastrostomy (PEG) tube placement ([name]) Continue Glucerna tube feedings.  Tracheostomy dependent ([name]) On 40% oxygen, 6 L flow-at baseline Currently worsening respiratory status  Arterial hypotension Hypotension resolved  Iron deficiency anemia Ferritin 53 and last  hemoglobin 9.1.  Anoxic encephalopathy ([name]) Supportive care      Subjective: Patient was seen and examined today.  Remains minimally responsive, worsening edema especially of upper extremities.  Needing more oxygen, no significant upper respiratory secretions.  Physical Exam: Vitals:   04/27/23 0844 04/27/23 1200 04/27/23 1349 04/27/23 1358  BP:   (!) 130/95    Pulse:  97    Resp:  13    Temp:  97.9 F (36.6 C)    TempSrc:      SpO2: 97% 95% 92% 91%  Weight:      Height:       General.  Obese and minimally responsive gentleman, in no acute distress. Pulmonary.  Lungs clear bilaterally, normal respiratory effort, short intermittent apneic spells CV.  Regular rate and rhythm, no JVD, rub or murmur. Abdomen.  Soft, nontender, nondistended, BS positive. CNS.  Minimally responsive, just open eyes. Extremities.  Worsening generalized edema, more in upper extremities  Data Reviewed: Prior data reviewed  Family Communication: Talked with wife on phone.  Disposition: Status is: Inpatient Remains inpatient appropriate because: Severity of illness  Planned Discharge Destination: Home with Home Health  DVT prophylaxis.  Eliquis Time spent: 60 minutes  This record has been created using Conservation officer, historic buildings. Errors have been sought and corrected,but may not always be located. Such creation errors do not reflect on the standard of care.   Author: [name], MD 04/27/2023 2:29 PM  For on call review [email or web address].

## 2023-04-27 NOTE — Progress Notes (Signed)
 [facility]  ROUNDING NOTE   Subjective:   Patient seen resting in bed No family present Eyes closed today No signs of distress   Creatinine 3.59 Tube feeds 65 ml/hr  Objective:  Vital signs in last 24 hours:  Temp:  [97.8 F (36.6 C)-98.4 F (36.9 C)] 97.8 F (36.6 C) (02/22 0800) Pulse Rate:  [51-103] 95 (02/22 0800) Resp:  [14-17] 15 (02/22 0800) BP: (120-126)/(87-100) 120/87 (02/22 0800) SpO2:  [92 %-100 %] 97 % (02/22 0844) FiO2 (%):  [40 %] 40 % (02/22 0844) Weight:  [119.7 kg] 119.7 kg (02/22 0500)  Weight change: 2.2 kg Filed Weights   04/25/23 0500 04/26/23 0456 04/27/23 0500  Weight: 117.2 kg 117.5 kg 119.7 kg    Intake/Output: I/O last 3 completed shifts: In: 1829.4 [NG/GT:1648.9; IV Piggyback:180.5] Out: 1025 [Urine:1025]   Intake/Output this shift:  No intake/output data recorded.  Physical Exam: General: NAD  Head: Normocephalic, atraumatic. Moist oral mucosal membranes  Eyes: Anicteric  Lungs:  Course, trach collar  Heart: Regular rate and rhythm  Abdomen:  Soft, nontender, PEG tube  Extremities: Dependent peripheral edema.  Neurologic: Eyes open, unable to follow commands  Skin: Stage II sacral decubitus  Access: None    Basic Metabolic Panel: Recent Labs  Lab 04/25/23 0623 04/26/23 0615 04/26/23 1441 04/26/23 1645 04/27/23 0559  NA 143 143 141 141 140  K 5.2* 5.6* 5.7* 5.7* 5.3*  CL 102 102 99 99 99  CO2 28 29 24 29 28   GLUCOSE 146* 188* 171* 170* 200*  BUN 96* 126* 126* 167* 138*  CREATININE 3.30* 3.48* 3.56* 3.66* 3.59*  CALCIUM 8.3* 8.4* 8.4* 8.5* 8.3*  PHOS  --  7.5*  --   --   --     Liver Function Tests: Recent Labs  Lab 04/26/23 0615  ALBUMIN 2.4*   No results for input(s): "LIPASE", "AMYLASE" in the last 168 hours. No results for input(s): "AMMONIA" in the last 168 hours.  CBC: Recent Labs  Lab 04/21/23 0352 04/22/23 0616 04/23/23 0556 04/24/23 0845 04/25/23 0623 04/26/23 0615  WBC 9.9   --  9.2 10.1 10.3 9.8  NEUTROABS  --   --  5.3  --   --   --   HGB 9.7* 9.9* 9.7* 9.1* 8.8* 8.9*  HCT 32.0*  --  32.3* 30.4* 29.2* 29.3*  MCV 108.8*  --  108.8* 109.4* 109.0* 108.9*  PLT 148*  --  166 159 160 175    Cardiac Enzymes: No results for input(s): "CKTOTAL", "CKMB", "CKMBINDEX", "TROPONINI" in the last 168 hours.  BNP: Invalid input(s): "POCBNP"  CBG: Recent Labs  Lab 04/26/23 0332 04/26/23 0807 04/26/23 1206 04/26/23 1731 04/26/23 2029  GLUCAP 222* 183* 173* 172* 174*    Microbiology: Results for orders placed or performed during the hospital encounter of 04/18/23  Resp panel by RT-PCR (RSV, Flu A&B, Covid) Anterior Nasal Swab     Status: None   Collection Time: 04/18/23  3:44 PM   Specimen: Anterior Nasal Swab  Result Value Ref Range Status   SARS Coronavirus 2 by RT PCR NEGATIVE NEGATIVE Final    Comment: (NOTE) SARS-CoV-2 target nucleic acids are NOT DETECTED.  The SARS-CoV-2 RNA is generally detectable in upper respiratory specimens during the acute phase of infection. The lowest concentration of SARS-CoV-2 viral copies this assay can detect is 138 copies/mL. A negative result does not preclude SARS-Cov-2 infection and should not be used as the sole basis for treatment or other patient  management decisions. A negative result may occur with  improper specimen collection/handling, submission of specimen other than nasopharyngeal swab, presence of viral mutation(s) within the areas targeted by this assay, and inadequate number of viral copies(<138 copies/mL). A negative result must be combined with clinical observations, patient history, and epidemiological information. The expected result is Negative.  Fact Sheet for Patients:  BloggerCourse.com  Fact Sheet for Healthcare Providers:  SeriousBroker.it  This test is no t yet approved or cleared by the [place] FDA and  has been authorized for  detection and/or diagnosis of SARS-CoV-2 by FDA under an Emergency Use Authorization (EUA). This EUA will remain  in effect (meaning this test can be used) for the duration of the COVID-19 declaration under Section 564(b)(1) of the Act, 21 U.S.C.section 360bbb-3(b)(1), unless the authorization is terminated  or revoked sooner.       Influenza A by PCR NEGATIVE NEGATIVE Final   Influenza B by PCR NEGATIVE NEGATIVE Final    Comment: (NOTE) The Xpert Xpress SARS-CoV-2/FLU/RSV plus assay is intended as an aid in the diagnosis of influenza from Nasopharyngeal swab specimens and should not be used as a sole basis for treatment. Nasal washings and aspirates are unacceptable for Xpert Xpress SARS-CoV-2/FLU/RSV testing.  Fact Sheet for Patients: BloggerCourse.com  Fact Sheet for Healthcare Providers: SeriousBroker.it  This test is not yet approved or cleared by the [place] FDA and has been authorized for detection and/or diagnosis of SARS-CoV-2 by FDA under an Emergency Use Authorization (EUA). This EUA will remain in effect (meaning this test can be used) for the duration of the COVID-19 declaration under Section 564(b)(1) of the Act, 21 U.S.C. section 360bbb-3(b)(1), unless the authorization is terminated or revoked.     Resp Syncytial Virus by PCR NEGATIVE NEGATIVE Final    Comment: (NOTE) Fact Sheet for Patients: BloggerCourse.com  Fact Sheet for Healthcare Providers: SeriousBroker.it  This test is not yet approved or cleared by the [place] FDA and has been authorized for detection and/or diagnosis of SARS-CoV-2 by FDA under an Emergency Use Authorization (EUA). This EUA will remain in effect (meaning this test can be used) for the duration of the COVID-19 declaration under Section 564(b)(1) of the Act, 21 U.S.C. section 360bbb-3(b)(1), unless the authorization is  terminated or revoked.  Performed at [facility], [address] [place], [place] [address]   Blood culture (routine x 2)     Status: None   Collection Time: 04/18/23  5:30 PM   Specimen: BLOOD  Result Value Ref Range Status   Specimen Description BLOOD BLOOD LEFT FOREARM  Final   Special Requests   Final    BOTTLES DRAWN AEROBIC AND ANAEROBIC Blood Culture results may not be optimal due to an inadequate volume of blood received in culture bottles   Culture   Final    NO GROWTH 5 DAYS Performed at [facility], [address] [place], [place] [address]    Report Status 04/23/2023 FINAL  Final  Blood culture (routine x 2)     Status: None   Collection Time: 04/18/23  7:42 PM   Specimen: BLOOD  Result Value Ref Range Status   Specimen Description BLOOD BLOOD RIGHT HAND  Final   Special Requests   Final    BOTTLES DRAWN AEROBIC AND ANAEROBIC Blood Culture adequate volume   Culture   Final    NO GROWTH 5 DAYS Performed at [facility], [address] [place], [place] [address]  Report Status 04/23/2023 FINAL  Final  Expectorated Sputum Assessment w Gram Stain, Rflx to Resp Cult     Status: None   Collection Time: 04/18/23  8:32 PM   Specimen: Sputum  Result Value Ref Range Status   Specimen Description SPUTUM  Final   Special Requests NONE  Final   Sputum evaluation   Final    THIS SPECIMEN IS ACCEPTABLE FOR SPUTUM CULTURE Performed at [facility], [address] [place], [place] [address]    Report Status 04/18/2023 FINAL  Final  Culture, Respiratory w Gram Stain     Status: None   Collection Time: 04/18/23  8:32 PM   Specimen: SPU  Result Value Ref Range Status   Specimen Description   Final    SPUTUM Performed at [facility], [address], [place], [place] [address]    Special Requests   Final    NONE Reflexed from [id number] Performed at [facility], [address] [place], [place] [address]     Gram Stain   Final    FEW SQUAMOUS EPITHELIAL CELLS PRESENT FEW WBC PRESENT,BOTH PMN AND MONONUCLEAR FEW YEAST FEW GRAM NEGATIVE RODS FEW GRAM POSITIVE RODS FEW GRAM POSITIVE COCCI IN PAIRS Performed at [facility], [address] [place], [place] [address]    Culture   Final    ABUNDANT PROVIDENCIA RETTGERI ABUNDANT PROTEUS MIRABILIS    Report Status 04/21/2023 FINAL  Final   Organism ID, Bacteria PROVIDENCIA RETTGERI  Final   Organism ID, Bacteria PROTEUS MIRABILIS  Final      Susceptibility   Proteus mirabilis - MIC*    AMPICILLIN <=2 SENSITIVE Sensitive     CEFEPIME <=0.12 SENSITIVE Sensitive     CEFTAZIDIME <=1 SENSITIVE Sensitive     CEFTRIAXONE <=0.25 SENSITIVE Sensitive     CIPROFLOXACIN <=0.25 SENSITIVE Sensitive     GENTAMICIN <=1 SENSITIVE Sensitive     IMIPENEM 2 SENSITIVE Sensitive     TRIMETH/SULFA <=20 SENSITIVE Sensitive     AMPICILLIN/SULBACTAM <=2 SENSITIVE Sensitive     PIP/TAZO <=4 SENSITIVE Sensitive ug/mL    * ABUNDANT PROTEUS MIRABILIS   Providencia rettgeri - MIC*    AMPICILLIN >=32 RESISTANT Resistant     CEFEPIME <=0.12 SENSITIVE Sensitive     CEFTAZIDIME <=1 SENSITIVE Sensitive     CEFTRIAXONE <=0.25 SENSITIVE Sensitive     CIPROFLOXACIN <=0.25 SENSITIVE Sensitive     GENTAMICIN <=1 SENSITIVE Sensitive     IMIPENEM 2 SENSITIVE Sensitive     TRIMETH/SULFA <=20 SENSITIVE Sensitive     AMPICILLIN/SULBACTAM 16 INTERMEDIATE Intermediate     PIP/TAZO <=4 SENSITIVE Sensitive ug/mL    * ABUNDANT PROVIDENCIA RETTGERI  MRSA Next Gen by PCR, Nasal     Status: None   Collection Time: 04/19/23 12:52 AM   Specimen: Nasal Mucosa; Nasal Swab  Result Value Ref Range Status   MRSA by PCR Next Gen NOT DETECTED NOT DETECTED Final    Comment: (NOTE) The GeneXpert MRSA Assay (FDA approved for NASAL specimens only), is one component of a comprehensive MRSA colonization surveillance program. It is not intended to diagnose MRSA infection nor to guide or  monitor treatment for MRSA infections. Test performance is not FDA approved in patients less than 76 years old. Performed at [facility], [address] [place], [place] [address]   Culture, blood (Routine X 2) w Reflex to ID Panel     Status: None   Collection Time: 04/22/23 12:27 PM  Specimen: BLOOD  Result Value Ref Range Status   Specimen Description BLOOD BLOOD LEFT HAND  Final   Special Requests   Final    BOTTLES DRAWN AEROBIC AND ANAEROBIC Blood Culture adequate volume   Culture   Final    NO GROWTH 5 DAYS Performed at [facility], [address] [place], [place] [address]    Report Status 04/27/2023 FINAL  Final  Culture, blood (Routine X 2) w Reflex to ID Panel     Status: None   Collection Time: 04/22/23 12:37 PM   Specimen: BLOOD  Result Value Ref Range Status   Specimen Description BLOOD BLOOD RIGHT HAND  Final   Special Requests   Final    BOTTLES DRAWN AEROBIC AND ANAEROBIC Blood Culture adequate volume   Culture   Final    NO GROWTH 5 DAYS Performed at [facility], [address], [place], [place] [address]    Report Status 04/27/2023 FINAL  Final  Culture, Respiratory w Gram Stain     Status: None (Preliminary result)   Collection Time: 04/22/23  1:22 PM   Specimen: Tracheal Aspirate  Result Value Ref Range Status   Specimen Description   Final    TRACHEAL ASPIRATE Performed at [facility], [address], [place], [place] [address]    Special Requests   Final    NONE Performed at [facility], [address], [place], [place] [address]    Gram Stain   Final    ABUNDANT SQUAMOUS EPITHELIAL CELLS PRESENT ABUNDANT WBC PRESENT, PREDOMINANTLY PMN FEW GRAM NEGATIVE RODS RARE GRAM POSITIVE COCCI IN PAIRS    Culture   Final    MODERATE PSEUDOMONAS AERUGINOSA MODERATE PROTEUS MIRABILIS MODERATE ENTEROCOCCUS FAECALIS SUSCEPTIBILITIES TO FOLLOW Performed at [facility], [address],  [place], [place] [address]    Report Status PENDING  Incomplete   Organism ID, Bacteria PSEUDOMONAS AERUGINOSA  Final   Organism ID, Bacteria PROTEUS MIRABILIS  Final      Susceptibility   Pseudomonas aeruginosa - MIC*    CEFTAZIDIME 4 SENSITIVE Sensitive     CIPROFLOXACIN >=4 RESISTANT Resistant     GENTAMICIN 8 INTERMEDIATE Intermediate     IMIPENEM 2 SENSITIVE Sensitive     PIP/TAZO 16 SENSITIVE Sensitive ug/mL    * MODERATE PSEUDOMONAS AERUGINOSA   Proteus mirabilis - MIC*    AMPICILLIN <=2 SENSITIVE Sensitive     CEFEPIME <=0.12 SENSITIVE Sensitive     CEFTAZIDIME <=1 SENSITIVE Sensitive     CEFTRIAXONE <=0.25 SENSITIVE Sensitive     CIPROFLOXACIN <=0.25 SENSITIVE Sensitive     GENTAMICIN <=1 SENSITIVE Sensitive     IMIPENEM 2 SENSITIVE Sensitive     TRIMETH/SULFA <=20 SENSITIVE Sensitive     AMPICILLIN/SULBACTAM <=2 SENSITIVE Sensitive     PIP/TAZO <=4 SENSITIVE Sensitive ug/mL    * MODERATE PROTEUS MIRABILIS  Resp panel by RT-PCR (RSV, Flu A&B, Covid) Anterior Nasal Swab     Status: None   Collection Time: 04/22/23  5:53 PM   Specimen: Anterior Nasal Swab  Result Value Ref Range Status   SARS Coronavirus 2 by RT PCR NEGATIVE NEGATIVE Final    Comment: (NOTE) SARS-CoV-2 target nucleic acids are NOT DETECTED.  The SARS-CoV-2 RNA is generally detectable in upper respiratory specimens during the acute phase of infection. The lowest concentration of SARS-CoV-2 viral copies this assay can detect is 138 copies/mL. A negative result does not preclude SARS-Cov-2 infection and should not be used as  the sole basis for treatment or other patient management decisions. A negative result may occur with  improper specimen collection/handling, submission of specimen other than nasopharyngeal swab, presence of viral mutation(s) within the areas targeted by this assay, and inadequate number of viral copies(<138 copies/mL). A negative result must be combined with clinical observations,  patient history, and epidemiological information. The expected result is Negative.  Fact Sheet for Patients:  BloggerCourse.com  Fact Sheet for Healthcare Providers:  SeriousBroker.it  This test is no t yet approved or cleared by the [place] FDA and  has been authorized for detection and/or diagnosis of SARS-CoV-2 by FDA under an Emergency Use Authorization (EUA). This EUA will remain  in effect (meaning this test can be used) for the duration of the COVID-19 declaration under Section 564(b)(1) of the Act, 21 U.S.C.section 360bbb-3(b)(1), unless the authorization is terminated  or revoked sooner.       Influenza A by PCR NEGATIVE NEGATIVE Final   Influenza B by PCR NEGATIVE NEGATIVE Final    Comment: (NOTE) The Xpert Xpress SARS-CoV-2/FLU/RSV plus assay is intended as an aid in the diagnosis of influenza from Nasopharyngeal swab specimens and should not be used as a sole basis for treatment. Nasal washings and aspirates are unacceptable for Xpert Xpress SARS-CoV-2/FLU/RSV testing.  Fact Sheet for Patients: BloggerCourse.com  Fact Sheet for Healthcare Providers: SeriousBroker.it  This test is not yet approved or cleared by the [place] FDA and has been authorized for detection and/or diagnosis of SARS-CoV-2 by FDA under an Emergency Use Authorization (EUA). This EUA will remain in effect (meaning this test can be used) for the duration of the COVID-19 declaration under Section 564(b)(1) of the Act, 21 U.S.C. section 360bbb-3(b)(1), unless the authorization is terminated or revoked.     Resp Syncytial Virus by PCR NEGATIVE NEGATIVE Final    Comment: (NOTE) Fact Sheet for Patients: BloggerCourse.com  Fact Sheet for Healthcare Providers: SeriousBroker.it  This test is not yet approved or cleared by the [place] FDA and has been authorized for detection and/or diagnosis of SARS-CoV-2 by FDA under an Emergency Use Authorization (EUA). This EUA will remain in effect (meaning this test can be used) for the duration of the COVID-19 declaration under Section 564(b)(1) of the Act, 21 U.S.C. section 360bbb-3(b)(1), unless the authorization is terminated or revoked.  Performed at [facility], [address] [place], [place] [address]   Urine Culture (for pregnant, neutropenic or urologic patients or patients with an indwelling urinary catheter)     Status: None   Collection Time: 04/24/23  7:35 PM   Specimen: Urine, Random  Result Value Ref Range Status   Specimen Description   Final    URINE, RANDOM Performed at [facility], [address], [place], [place] [address]    Special Requests   Final    NONE Performed at [facility], [address] [place], [place] [address]    Culture   Final    NO GROWTH Performed at [facility], [address] [place], [place] [address]    Report Status 04/25/2023 FINAL  Final    Coagulation Studies: No results for input(s): "LABPROT", "INR" in the last 72 hours.  Urinalysis: Recent Labs    04/24/23 1935  COLORURINE YELLOW*  LABSPEC 1.020  PHURINE 5.0  GLUCOSEU NEGATIVE  HGBUR SMALL*  BILIRUBINUR NEGATIVE  KETONESUR NEGATIVE  PROTEINUR 100*  NITRITE NEGATIVE  LEUKOCYTESUR NEGATIVE      Imaging: DG Chest  Port 1 View Result Date: 04/26/2023 CLINICAL DATA:  Hypoxia EXAM: PORTABLE CHEST 1 VIEW COMPARISON:  CT 04/23/2018 FINDINGS: Tracheostomy tube noted. Enlarged cardiac silhouette. Low lung volumes. Central venous congestion. LEFT basilar atelectasis. IMPRESSION: 1. Low lung volumes and LEFT basilar atelectasis. 2. Cardiomegaly and central venous congestion. Electronically Signed   By: Genevive Bi M.D.   On: 04/26/2023 20:22   US RENAL Result Date: 04/25/2023 CLINICAL DATA:  [id number] AKI (acute  kidney injury) [facility]) [id number] EXAM: RENAL / URINARY TRACT ULTRASOUND COMPLETE COMPARISON:  April 24, 2023 FINDINGS: Evaluation is limited by inability to optimally position patient or hold breath. Right Kidney: Renal measurements: 12.5 x 6.6 x 6.9 cm = volume: 297 mL. Echogenicity is the upper limits of normal. No suspicious mass or hydronephrosis visualized. Benign cyst is incidentally noted measuring up to 2.9 cm (for which no dedicated imaging follow-up is recommended). Left Kidney: Renal measurements: 11.4 x 8.4 x 7.3 cm = volume: 363 mL. Echogenicity is the upper limits of normal. No mass or hydronephrosis visualized. Bladder: Appears normal for degree of bladder distention. Prevoid volume 108 ML. Other: None. IMPRESSION: No hydronephrosis. Electronically Signed   By: Meda Klinefelter M.D.   On: 04/25/2023 13:13     Medications:    feeding supplement (NEPRO CARB STEADY) 1,000 mL (04/26/23 1304)   furosemide (LASIX) 200 mg in dextrose 5 % 100 mL (2 mg/mL) infusion     piperacillin-tazobactam (ZOSYN)  IV 3.375 g (04/27/23 0657)    apixaban  5 mg Per Tube BID   atropine  2 drop Sublingual QID   budesonide (PULMICORT) nebulizer solution  0.25 mg Nebulization BID   carvedilol  6.25 mg Per Tube BID WC   clonazePAM  0.5 mg Per Tube BID   famotidine  20 mg Per Tube BID   feeding supplement (PROSource TF20)  60 mL Per Tube Daily   free water  200 mL Per Tube Q6H   glycopyrrolate  1 mg Per Tube TID   insulin aspart  0-9 Units Subcutaneous Q4H   insulin glargine-yfgn  10 Units Subcutaneous QHS   ipratropium-albuterol  3 mL Nebulization TID   levETIRAcetam  750 mg Per Tube BID   metoCLOPramide  10 mg Per Tube Q8H   nutrition supplement (JUVEN)  1 packet Per Tube BID BM   oxyCODONE  2.5 mg Per Tube Q8H   sodium zirconium cyclosilicate  10 g Per Tube Daily   valproic acid  750 mg Per Tube TID   acetaminophen (TYLENOL) oral liquid 160 mg/5 mL, albuterol, guaiFENesin, iohexol, LORazepam,  ondansetron (ZOFRAN) IV, polyethylene glycol, sodium chloride HYPERTONIC  Assessment/ Plan:  Billy Cherry is a 53 y.o.  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  Acute kidney injury, secondary to inflammation and infection resulting in ATN.  Imaging-no hydronephrosis noted. Urinalysis-04/24/2023-small hemoglobin 11-20 RBCs, rare WBCs Likely ATN secondary to concurrent illness,  pneumonia. Patient is oliguric. Adequate urine output noted in canister. Renal function stable today.No acute indication for dialysis. Request close monitoring of urine output. Will continue to monitor.   Lab Results  Component Value Date   CREATININE 3.59 (H) 04/27/2023   CREATININE 3.66 (H) 04/26/2023   CREATININE 3.56 (H) 04/26/2023    Intake/Output Summary (Last 24 hours) at 04/27/2023 1054 Last data filed at 04/26/2023 1800 Gross per 24 hour  Intake 200 ml  Output 400 ml  Net -200 ml    2.  Hyperkalemia Potassium 5.3.  Tube feeds changed to Nepro.    LOS: 9 Billy Cherry 2/22/202510:54 AM

## 2023-04-27 NOTE — Progress Notes (Signed)
 Pharmacy Antibiotic Note  Billy Cherry is a 53 y.o. male admitted on 04/18/2023 with  HCAP .  Pharmacy has been consulted for ceftazidime dosing.  Today, 04/27/23: Lactic acid 1.5 No updated WBC, WNL yesterday Afebrile  No imaging from today; 2/19 CT a/p showing Confluent consolidation in the left lower lobe with partial right lower lobe consolidation, findings are suspicious for pneumonia or aspiration.   Plan: Start ceftazidime 2 g IV Q12H Monitor renal function and need for adjustments; patient currently borderline for antibiotic dosing Follow-up BAL cultures  Height: 5\' 11"  (180.3 cm) Weight: 119.7 kg (263 lb 14.3 oz) IBW/kg (Calculated) : 75.3  Temp (24hrs), Avg:98.1 F (36.7 C), Min:97.8 F (36.6 C), Max:98.4 F (36.9 C)  Recent Labs  Lab 04/21/23 0352 04/22/23 0616 04/23/23 0556 04/24/23 0845 04/25/23 0623 04/26/23 0615 04/26/23 1441 04/26/23 1645 04/27/23 0559 04/27/23 1500  WBC 9.9  --  9.2 10.1 10.3 9.8  --   --   --   --   CREATININE 1.66*   < > 2.22* 2.91* 3.30* 3.48* 3.56* 3.66* 3.59*  --   LATICACIDVEN  --   --   --   --   --   --   --   --   --  1.5   < > = values in this interval not displayed.    Estimated Creatinine Clearance: 31.7 mL/min (A) (by C-G formula based on SCr of 3.59 mg/dL (H)).    Allergies  Allergen Reactions   Lisinopril Swelling    angioedema  Other Reaction(s): angioedema    Antimicrobials this admission: 2/13-2/17 Cefepime  2/13-2/14; 2/18 x 1 dose Vanc  2/14-2/17 Metronidazole 2/17-2/19 Azithromycin 2/17-2/22 Zosyn 2/22 Ceftazidime>>   Microbiology results: 2/22 BAL: IP 2/19 UCx: No growth  2/17 BCx: NGTD 2/17 Sputum: pseudomonas, proteus, enterococcus faecalis   2/14 MRSA PCR: Negative  2/13 Sputum: providencia rettgeri, proteus mirabilis   Thank you for allowing pharmacy to be a part of this patient's care.  Merryl Hacker, PharmD Clinical Pharmacist  04/27/2023 4:03 PM

## 2023-04-27 NOTE — Consult Note (Signed)
 Reason for Consult:Acute on chronic hypoxic respiratory failure Referring Physician: Dr. [name]  Billy Cherry is an 53 y.o. male.  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  Patient has been transferred to the ICU for worsening hypoxemia, prolonged periods of apnea on trach collar, increased copious secretions and persistent fever.  Imaging studies showed bilateral pneumonia.  Patient was started on empiric antibiotics.  With vancomycin and Zosyn and subsequently vancomycin was discontinued the patient was maintained on Zosyn. Patient's kidney function has also been deteriorating since admission.  His baseline creatinine 1.26 and has now increased to 3.59 with a BUN of 138 mg/dL.  His GFR is down to 20.  He remains tachycardic and his SpO2 is fluctuating between 93 and 98% on 10 L of oxygen via trach collar.  Past Medical History:  Diagnosis Date   Anoxic brain injury ([name])    Hypertension     Past Surgical History:  Procedure Laterality Date   BACK SURGERY     CORONARY/GRAFT ACUTE MI REVASCULARIZATION N/A 03/23/2021   Procedure: Coronary/Graft Acute MI Revascularization;  Surgeon: Marcina Millard, MD;  Location: [facility] INVASIVE CV LAB;  Service: Cardiovascular;  Laterality: N/A;   IR GASTROSTOMY TUBE MOD SED  04/26/2021   LEFT HEART CATH AND CORONARY ANGIOGRAPHY N/A 03/23/2021   Procedure: LEFT HEART CATH AND CORONARY ANGIOGRAPHY;  Surgeon: Marcina Millard, MD;  Location: [facility] INVASIVE CV LAB;  Service: Cardiovascular;  Laterality: N/A;   SHOULDER SURGERY      No family history on file.  Social History:  reports that he has never smoked. He has never used smokeless tobacco. He reports  current alcohol use. He reports that he does not use drugs.  Allergies:  Allergies  Allergen Reactions   Lisinopril Swelling    angioedema  Other Reaction(s): angioedema     Current Facility-Administered Medications:    acetaminophen (TYLENOL) 160 MG/5ML solution 650 mg, 650 mg, Per Tube, Q6H PRN, Lorretta Harp, MD, 650 mg at 04/24/23 0428   albuterol (PROVENTIL) (2.5 MG/3ML) 0.083% nebulizer solution 2.5 mg, 2.5 mg, Nebulization, Q4H PRN, Lorretta Harp, MD   apixaban Everlene Balls) tablet 5 mg, 5 mg, Per Tube, BID, Lorretta Harp, MD, 5 mg at 04/27/23 4098   atropine 1 % ophthalmic solution 2 drop, 2 drop, Sublingual, QID, Lorretta Harp, MD, 2 drop at 04/27/23 1405   budesonide (PULMICORT) nebulizer solution 0.25 mg, 0.25 mg, Nebulization, BID, Wieting, Richard, MD, 0.25 mg at 04/27/23 0844   carvedilol (COREG) tablet 6.25 mg, 6.25 mg, Per Tube, BID WC, Wieting, Richard, MD, 6.25 mg at 04/27/23 1191   Chlorhexidine Gluconate Cloth 2 % PADS 6 each, 6 each, Topical, Daily, Kasa, Kurian, MD   clonazePAM (KLONOPIN) tablet 0.5 mg, 0.5 mg, Per Tube, BID, Lorretta Harp, MD, 0.5 mg at 04/27/23 4782   famotidine (PEPCID) tablet 20 mg, 20 mg, Per Tube, BID, Lorretta Harp, MD, 20 mg at 04/27/23 9562   feeding supplement (NEPRO CARB STEADY) liquid 1,000 mL, 1,000 mL, Per Tube, Continuous, [name], MD, Last Rate: 50 mL/hr at 04/27/23 1537, Infusion Verify at 04/27/23 1537   feeding supplement (PROSource TF20) liquid 60 mL, 60 mL, Per Tube, Daily, [name], MD, 60 mL at 04/27/23 0931   free water 200  mL, 200 mL, Per Tube, Q6H, Lorretta Harp, MD, 200 mL at 04/27/23 1200   furosemide (LASIX) 200 mg in dextrose 5 % 100 mL (2 mg/mL) infusion, 4 mg/hr, Intravenous, Continuous, [name], MD, Last Rate: 2 mL/hr at 04/27/23 1537, 4 mg/hr at 04/27/23 1537   glycopyrrolate (ROBINUL) tablet 1 mg, 1 mg, Per Tube, TID, Lorretta Harp, MD, 1 mg at 04/27/23 1356   guaiFENesin (ROBITUSSIN) 100 MG/5ML liquid 200 mg, 200 mg, Per Tube,  Q4H PRN, Lorretta Harp, MD, 200 mg at 04/23/23 1638   insulin aspart (novoLOG) injection 0-9 Units, 0-9 Units, Subcutaneous, Q4H, Manuela Schwartz, NP, 3 Units at 04/27/23 1323   insulin glargine-yfgn (SEMGLEE) injection 10 Units, 10 Units, Subcutaneous, QHS, Lorretta Harp, MD, 10 Units at 04/26/23 2119   iohexol (OMNIPAQUE) 300 MG/ML solution 30 mL, 30 mL, Oral, Once PRN, [name], MD   ipratropium-albuterol (DUONEB) 0.5-2.5 (3) MG/3ML nebulizer solution 3 mL, 3 mL, Nebulization, TID, Renae Gloss, Richard, MD, 3 mL at 04/27/23 1349   levETIRAcetam (KEPPRA) 100 MG/ML solution 750 mg, 750 mg, Per Tube, BID, Alford [name], MD, 750 mg at 04/27/23 0934   LORazepam (ATIVAN) injection 2 mg, 2 mg, Intravenous, Q2H PRN, Lorretta Harp, MD   metoCLOPramide (REGLAN) 10 MG/10ML solution 10 mg, 10 mg, Per Tube, Q8H, Lorretta Harp, MD, 10 mg at 04/27/23 1354   nutrition supplement (JUVEN) (JUVEN) powder packet 1 packet, 1 packet, Per Tube, BID BM, [name], MD, 1 packet at 04/27/23 1405   ondansetron (ZOFRAN) injection 4 mg, 4 mg, Intravenous, Q8H PRN, Lorretta Harp, MD   oxyCODONE (ROXICODONE) 5 MG/5ML solution 2.5 mg, 2.5 mg, Per Tube, Q8H, Wieting, Richard, MD, 2.5 mg at 04/27/23 1610   polyethylene glycol (MIRALAX / GLYCOLAX) packet 17 g, 17 g, Per Tube, Daily PRN, [name], MD   sodium chloride HYPERTONIC 3 % nebulizer solution 4 mL, 4 mL, Nebulization, PRN, Manuela Schwartz, NP   sodium zirconium cyclosilicate (LOKELMA) packet 10 g, 10 g, Per Tube, Daily, [name], MD, 10 g at 04/27/23 9604   valproic acid (DEPAKENE) 250 MG/5ML solution 750 mg, 750 mg, Per Tube, TID, Lorretta Harp, MD, 750 mg at 04/27/23 1353   Results for orders placed or performed during the hospital encounter of 04/18/23 (from the past 48 hours)  Glucose, capillary     Status: Abnormal   Collection Time: 04/25/23  3:35 PM  Result Value Ref Range   Glucose-Capillary 156 (H) 70 - 99 mg/dL    Comment: Glucose reference range applies  only to samples taken after fasting for at least 8 hours.  Glucose, capillary     Status: Abnormal   Collection Time: 04/25/23  7:36 PM  Result Value Ref Range   Glucose-Capillary 180 (H) 70 - 99 mg/dL    Comment: Glucose reference range applies only to samples taken after fasting for at least 8 hours.  Glucose, capillary     Status: Abnormal   Collection Time: 04/25/23 11:45 PM  Result Value Ref Range   Glucose-Capillary 186 (H) 70 - 99 mg/dL    Comment: Glucose reference range applies only to samples taken after fasting for at least 8 hours.  Glucose, capillary     Status: Abnormal   Collection Time: 04/26/23  3:32 AM  Result Value Ref Range   Glucose-Capillary 222 (H) 70 - 99 mg/dL    Comment: Glucose reference range applies only to samples taken after fasting for at least 8 hours.  CBC     Status:  Abnormal   Collection Time: 04/26/23  6:15 AM  Result Value Ref Range   WBC 9.8 4.0 - 10.5 K/uL   RBC 2.69 (L) 4.22 - 5.81 MIL/uL   Hemoglobin 8.9 (L) 13.0 - 17.0 g/dL   HCT 16.1 (L) 09.6 - 04.5 %   MCV 108.9 (H) 80.0 - 100.0 fL   MCH 33.1 26.0 - 34.0 pg   MCHC 30.4 30.0 - 36.0 g/dL   RDW 40.9 81.1 - 91.4 %   Platelets 175 150 - 400 K/uL   nRBC 0.2 0.0 - 0.2 %    Comment: Performed at [facility], [address], [place], [place] [address]  Renal function panel     Status: Abnormal   Collection Time: 04/26/23  6:15 AM  Result Value Ref Range   Sodium 143 135 - 145 mmol/L   Potassium 5.6 (H) 3.5 - 5.1 mmol/L   Chloride 102 98 - 111 mmol/L   CO2 29 22 - 32 mmol/L   Glucose, Bld 188 (H) 70 - 99 mg/dL    Comment: Glucose reference range applies only to samples taken after fasting for at least 8 hours.   BUN 126 (H) 6 - 20 mg/dL    Comment: RESULT CONFIRMED BY MANUAL DILUTION SRR   Creatinine, Ser 3.48 (H) 0.61 - 1.24 mg/dL   Calcium 8.4 (L) 8.9 - 10.3 mg/dL   Phosphorus 7.5 (H) 2.5 - 4.6 mg/dL   Albumin 2.4 (L) 3.5 - 5.0 g/dL   GFR, Estimated 20 (L) >60 mL/min     Comment: (NOTE) Calculated using the CKD-EPI Creatinine Equation (2021)    Anion gap 12 5 - 15    Comment: Performed at [facility], [address] [place], [place] [address]  Glucose, capillary     Status: Abnormal   Collection Time: 04/26/23  8:07 AM  Result Value Ref Range   Glucose-Capillary 183 (H) 70 - 99 mg/dL    Comment: Glucose reference range applies only to samples taken after fasting for at least 8 hours.  Glucose, capillary     Status: Abnormal   Collection Time: 04/26/23 12:06 PM  Result Value Ref Range   Glucose-Capillary 173 (H) 70 - 99 mg/dL    Comment: Glucose reference range applies only to samples taken after fasting for at least 8 hours.  Basic metabolic panel     Status: Abnormal   Collection Time: 04/26/23  2:41 PM  Result Value Ref Range   Sodium 141 135 - 145 mmol/L   Potassium 5.7 (H) 3.5 - 5.1 mmol/L   Chloride 99 98 - 111 mmol/L   CO2 24 22 - 32 mmol/L   Glucose, Bld 171 (H) 70 - 99 mg/dL    Comment: Glucose reference range applies only to samples taken after fasting for at least 8 hours.   BUN 126 (H) 6 - 20 mg/dL    Comment: RESULT CONFIRMED BY MANUAL DILUTION MJU   Creatinine, Ser 3.56 (H) 0.61 - 1.24 mg/dL   Calcium 8.4 (L) 8.9 - 10.3 mg/dL   GFR, Estimated 20 (L) >60 mL/min    Comment: (NOTE) Calculated using the CKD-EPI Creatinine Equation (2021)    Anion gap 18 (H) 5 - 15    Comment: Performed at [facility], [address], [place], [place] [address]  Basic metabolic panel     Status: Abnormal   Collection Time: 04/26/23  4:45 PM  Result Value Ref Range   Sodium 141 135 - 145 mmol/L  Potassium 5.7 (H) 3.5 - 5.1 mmol/L   Chloride 99 98 - 111 mmol/L   CO2 29 22 - 32 mmol/L   Glucose, Bld 170 (H) 70 - 99 mg/dL    Comment: Glucose reference range applies only to samples taken after fasting for at least 8 hours.   BUN 167 (H) 6 - 20 mg/dL    Comment: RESULT CONFIRMED BY MANUAL DILUTION BGH   Creatinine, Ser 3.66 (H)  0.61 - 1.24 mg/dL   Calcium 8.5 (L) 8.9 - 10.3 mg/dL   GFR, Estimated 19 (L) >60 mL/min    Comment: (NOTE) Calculated using the CKD-EPI Creatinine Equation (2021)    Anion gap 13 5 - 15    Comment: Performed at [facility], [address] [place], [place] [address]  Glucose, capillary     Status: Abnormal   Collection Time: 04/26/23  5:31 PM  Result Value Ref Range   Glucose-Capillary 172 (H) 70 - 99 mg/dL    Comment: Glucose reference range applies only to samples taken after fasting for at least 8 hours.  D-dimer, quantitative     Status: Abnormal   Collection Time: 04/26/23  8:16 PM  Result Value Ref Range   D-Dimer, Quant 0.70 (H) 0.00 - 0.50 ug/mL-FEU    Comment: (NOTE) At the manufacturer cut-off value of 0.5 g/mL FEU, this assay has a negative predictive value of 95-100%.This assay is intended for use in conjunction with a clinical pretest probability (PTP) assessment model to exclude pulmonary embolism (PE) and deep venous thrombosis (DVT) in outpatients suspected of PE or DVT. Results should be correlated with clinical presentation. Performed at [facility], [address] [place], [place] [address]   Glucose, capillary     Status: Abnormal   Collection Time: 04/26/23  8:29 PM  Result Value Ref Range   Glucose-Capillary 174 (H) 70 - 99 mg/dL    Comment: Glucose reference range applies only to samples taken after fasting for at least 8 hours.  Basic metabolic panel     Status: Abnormal   Collection Time: 04/27/23  5:59 AM  Result Value Ref Range   Sodium 140 135 - 145 mmol/L   Potassium 5.3 (H) 3.5 - 5.1 mmol/L   Chloride 99 98 - 111 mmol/L   CO2 28 22 - 32 mmol/L   Glucose, Bld 200 (H) 70 - 99 mg/dL    Comment: Glucose reference range applies only to samples taken after fasting for at least 8 hours.   BUN 138 (H) 6 - 20 mg/dL    Comment: RESULT CONFIRMED BY MANUAL DILUTION MJU   Creatinine, Ser 3.59 (H) 0.61 - 1.24 mg/dL   Calcium 8.3 (L)  8.9 - 10.3 mg/dL   GFR, Estimated 20 (L) >60 mL/min    Comment: (NOTE) Calculated using the CKD-EPI Creatinine Equation (2021)    Anion gap 13 5 - 15    Comment: Performed at [facility], [address] [place], [place] [address]  Blood gas, venous     Status: Abnormal   Collection Time: 04/27/23  3:07 PM  Result Value Ref Range   pH, Ven 7.39 7.25 - 7.43   pCO2, Ven 55 44 - 60 mmHg   pO2, Ven 67 (H) 32 - 45 mmHg   Bicarbonate 33.3 (H) 20.0 - 28.0 mmol/L   Acid-Base Excess 6.7 (H) 0.0 - 2.0 mmol/L   O2 Saturation 94.9 %   Patient temperature 37.0    Collection site VEIN  Comment: Performed at [facility], [address] [place], [place] [address]    DG Chest Chance 1 View Result Date: 04/26/2023 CLINICAL DATA:  Hypoxia EXAM: PORTABLE CHEST 1 VIEW COMPARISON:  CT 04/23/2018 FINDINGS: Tracheostomy tube noted. Enlarged cardiac silhouette. Low lung volumes. Central venous congestion. LEFT basilar atelectasis. IMPRESSION: 1. Low lung volumes and LEFT basilar atelectasis. 2. Cardiomegaly and central venous congestion. Electronically Signed   By: Genevive Bi M.D.   On: 04/26/2023 20:22    Review of Systems  Reason unable to perform ROS: Patient has a tracheostomy.   Blood pressure (!) 138/94, pulse (!) 105, temperature 97.9 F (36.6 C), temperature source Oral, resp. rate 14, height 5\' 11"  (1.803 m), weight 119.7 kg, SpO2 98%. Physical exam Constitutional: Alert but disoriented. A appears chronically ill  HEENT: Normocephalic and atraumatic,  EOM are normal,  Pupils are equal, round, and reactive to light, normal range of motion and neck is supple; trachea in midline with tracheostomy tube in place, copious secretions draining from trach tube Cardiovascular: Apical pulse is regular, S1-S2, no murmurs regurg or gallops.   Pulmonary/Chest: Decreased chest movement, bilateral lung sounds with profound rhonchi and crackles. Abdominal: Distended, firm on  palpation, hypoactive bowel sounds.  PEG tube insertion site clean with intact dressing Musculoskeletal: Contractures and muscle wasting in bilateral lower extremities; bed confined Neurological: quadriplegic, unable to move lower and upper extremities, nonverbal skin: Skin is warm and dry. 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 Multifocal pneumonia versus aspiration pneumonitis-sputum culture positive for multiple organisms-Enterococcus faecalis, Pseudomonas, aeruginosa and Proteus mirabilis Mucous plug-resolved after bedside bronchoscopy Plan -Bedside bronchoscopy completed and BAL specimen obtained -Mucous plug resolved with bronchoscopy - Follow-up BAL cultures -Discontinue Zosyn -Start ceftazidime per pharmacy dosing - Monitor fever curve -Supplemental oxygen via trach collar and titrate to maintain SpO2 above 90% -Aggressive pulmonary toileting -ABGs and chest x-ray as needed -As needed bronchodilator because -If persistent hypoxemia, will switch to a cuffed trach and placed on full vent support -Strict aspiration precautions Nephrology Acute on chronic renal failure-creatinine has trended up from less than 2 upon admission to 3.5 currently. Plan -Nephrology already consulted and following - Continue to trend creatinine and monitor urine output - Avoid nephrotoxic drugs  Rest of the treatment plan per hospitalist team. Best Practice: Diet/type: N.p.o./PEG tube VTE prophylaxis:  SCD's /patient is already on apixaban GI prophylaxis: Famotidine Lines: Tracheostomy tube, gastrostomy tube, and peripheral IVs Foley: Yes and still needed for close monitoring due to acute renal failure Code Status: Full code Last date of multidisciplinary goals of care discussion 04/27/2023 at bedside with patient's  wife   CRITICAL CARE  Total critical care time: 45 minutes  Magddalene S Tukov-Yual ANP-BC Pulmonary and Critical Care Medicine [facility] Pager [phone] or [phone]  NB: This document was prepared using Dragon voice recognition software and may include unintentional dictation errors.   04/27/2023, 3:12 PM

## 2023-04-27 NOTE — Assessment & Plan Note (Signed)
 Continue Coreg

## 2023-04-27 NOTE — Assessment & Plan Note (Signed)
 On 40% oxygen, 6 L flow-at baseline Currently worsening respiratory status

## 2023-04-27 NOTE — Progress Notes (Signed)
 Patient transferred to ICU. Bedside report given to [facility], [place].  All care transferred.

## 2023-04-27 NOTE — Plan of Care (Signed)
  Problem: Fluid Volume: Goal: Ability to maintain a balanced intake and output will improve Outcome: Progressing   Problem: Metabolic: Goal: Ability to maintain appropriate glucose levels will improve Outcome: Progressing   Problem: Nutritional: Goal: Maintenance of adequate nutrition will improve Outcome: Progressing   Problem: Nutrition: Goal: Adequate nutrition will be maintained Outcome: Progressing   Problem: Pain Managment: Goal: General experience of comfort will improve and/or be controlled Outcome: Progressing

## 2023-04-27 NOTE — Progress Notes (Signed)
       CROSS COVER NOTE  NAME: Kamsiyochukwu Spickler MRN: [id number] DOB : April 16, 1970 ATTENDING PHYSICIAN: [name], MD    Date of Service   04/27/2023   HPI/Events of Note   See previous note Patients oxygen weaned down to 35 - 40% T collar with stable sats I last hour additional desat episode requiring 100%  Interventions   Assessment/Plan: Course rhonchi minimal secretions cleared with suctioning Discussed with RT  Hypertonic neb Wean oxygen if able        Donnie Mesa NP [facility] Cross Cover 7pm-7am - check amion for availability Pager [phone]

## 2023-04-28 DIAGNOSIS — J189 Pneumonia, unspecified organism: Secondary | ICD-10-CM | POA: Diagnosis not present

## 2023-04-28 DIAGNOSIS — N179 Acute kidney failure, unspecified: Secondary | ICD-10-CM | POA: Diagnosis not present

## 2023-04-28 DIAGNOSIS — E875 Hyperkalemia: Secondary | ICD-10-CM | POA: Diagnosis not present

## 2023-04-28 DIAGNOSIS — J9621 Acute and chronic respiratory failure with hypoxia: Secondary | ICD-10-CM | POA: Diagnosis not present

## 2023-04-28 LAB — BASIC METABOLIC PANEL
Anion gap: 11 (ref 5–15)
BUN: 147 mg/dL — ABNORMAL HIGH (ref 6–20)
CO2: 31 mmol/L (ref 22–32)
Calcium: 8.7 mg/dL — ABNORMAL LOW (ref 8.9–10.3)
Chloride: 99 mmol/L (ref 98–111)
Creatinine, Ser: 3.54 mg/dL — ABNORMAL HIGH (ref 0.61–1.24)
GFR, Estimated: 20 mL/min — ABNORMAL LOW (ref >60)
Glucose, Bld: 225 mg/dL — ABNORMAL HIGH (ref 70–99)
Potassium: 5.1 mmol/L (ref 3.5–5.1)
Sodium: 141 mmol/L (ref 135–145)

## 2023-04-28 LAB — CBC
HCT: 26.6 % — ABNORMAL LOW (ref 39.0–52.0)
Hemoglobin: 8 g/dL — ABNORMAL LOW (ref 13.0–17.0)
MCH: 33.3 pg (ref 26.0–34.0)
MCHC: 30.1 g/dL (ref 30.0–36.0)
MCV: 110.8 fL — ABNORMAL HIGH (ref 80.0–100.0)
Platelets: 185 10*3/uL (ref 150–400)
RBC: 2.4 MIL/uL — ABNORMAL LOW (ref 4.22–5.81)
RDW: 14.2 % (ref 11.5–15.5)
WBC: 14.8 10*3/uL — ABNORMAL HIGH (ref 4.0–10.5)
nRBC: 0.3 % — ABNORMAL HIGH (ref 0.0–0.2)

## 2023-04-28 LAB — GLUCOSE, CAPILLARY
Glucose-Capillary: 205 mg/dL — ABNORMAL HIGH (ref 70–99)
Glucose-Capillary: 228 mg/dL — ABNORMAL HIGH (ref 70–99)
Glucose-Capillary: 259 mg/dL — ABNORMAL HIGH (ref 70–99)
Glucose-Capillary: 269 mg/dL — ABNORMAL HIGH (ref 70–99)
Glucose-Capillary: 305 mg/dL — ABNORMAL HIGH (ref 70–99)
Glucose-Capillary: 315 mg/dL — ABNORMAL HIGH (ref 70–99)

## 2023-04-28 MED ORDER — INSULIN GLARGINE-YFGN 100 UNIT/ML ~~LOC~~ SOLN
13.0000 [IU] | Freq: Every day | SUBCUTANEOUS | Status: DC
  Administered 2023-04-28 – 2023-04-29 (×2): 13 [IU] via SUBCUTANEOUS
  Filled 2023-04-28 (×2): qty 0.13

## 2023-04-28 MED ORDER — INSULIN ASPART 100 UNIT/ML IJ SOLN
0.0000 [IU] | INTRAMUSCULAR | Status: DC
  Administered 2023-04-28: 8 [IU] via SUBCUTANEOUS
  Administered 2023-04-28: 11 [IU] via SUBCUTANEOUS
  Administered 2023-04-29: 5 [IU] via SUBCUTANEOUS
  Administered 2023-04-29: 8 [IU] via SUBCUTANEOUS
  Administered 2023-04-29: 15 [IU] via SUBCUTANEOUS
  Administered 2023-04-29: 11 [IU] via SUBCUTANEOUS
  Administered 2023-04-29: 5 [IU] via SUBCUTANEOUS
  Filled 2023-04-28 (×7): qty 1

## 2023-04-28 MED ORDER — ORAL CARE MOUTH RINSE
15.0000 mL | OROMUCOSAL | Status: DC | PRN
  Administered 2023-04-28: 15 mL via OROMUCOSAL

## 2023-04-28 MED ORDER — ORAL CARE MOUTH RINSE
15.0000 mL | OROMUCOSAL | Status: DC
  Administered 2023-04-28 – 2023-05-01 (×10): 15 mL via OROMUCOSAL

## 2023-04-28 NOTE — Consult Note (Addendum)
 WOC Nurse Consult Note: Reason for Consult: sacral pressure injury; new site on scrotum Patient with trach, PEG, anoxic brain injury, DM Wound type: Stage 2 Pressure injury; sacrum; upper buttocks; partial thickness with skin sloughing Pressure Injury POA: Yes/No/NA Measurement: see nursing flow sheets Wound bed:100% pink, clean, skin flap lifted, pink skin under this Drainage (amount, consistency, odor) scant, serous Periwound: intact  Dressing procedure/placement/frequency: Cleanse with saline, pat dry Apply single layer of xeroform to the sacral wound and scrotum (due to location), top with foam on the buttocks, ok to use foam on scrotum if will allow to stay in place.  LALM in place while in the ICU for moisture management and pressure re-distribution    Re consult if needed, will not follow at this time. Thanks  Billy Cherry [employer], RN,CWOCN, CNS, CWON-AP [phone])

## 2023-04-28 NOTE — Assessment & Plan Note (Signed)
 Glucerna was switched with Nepro due to worsening renal function and hyperkalemia. Now with hyperglycemia -Increasing Semglee to 13 unit and making SSI moderate

## 2023-04-28 NOTE — Plan of Care (Signed)
   Problem: Fluid Volume: Goal: Ability to maintain a balanced intake and output will improve Outcome: Progressing   Problem: Nutritional: Goal: Maintenance of adequate nutrition will improve Outcome: Progressing

## 2023-04-28 NOTE — Assessment & Plan Note (Signed)
 On Eliquis

## 2023-04-28 NOTE — Progress Notes (Addendum)
 [facility]  ROUNDING NOTE   Subjective:   Patient seen and evaluated at bedside in ICU Remains on trach collar Eyes closed  Creatinine 3.54 Tube feeds 50 ml/hr Urine output  in past 24 hours . Became hypoxic and was transferred to ICU. Received urgent [place]  Objective:  Vital signs in last 24 hours:  Temp:  [97.3 F (36.3 C)-98.3 F (36.8 C)] 98.3 F (36.8 C) (02/23 0800) Pulse Rate:  [50-108] 50 (02/23 0600) Resp:  [13-19] 14 (02/23 0600) BP: (109-143)/(79-113) 123/89 (02/23 0600) SpO2:  [91 %-99 %] 97 % (02/23 0600) FiO2 (%):  [40 %-70 %] 50 % (02/23 0720)  Weight change:  Filed Weights   04/25/23 0500 04/26/23 0456 04/27/23 0500  Weight: 117.2 kg 117.5 kg 119.7 kg    Intake/Output: I/O last 3 completed shifts: In: 3070.2 [I.V.:36.9; Other:120; NW/GN:5621; IV Piggyback:277.3] Out: 2490 [Urine:1890; Stool:600]   Intake/Output this shift:  Total I/O In: 1364 [I.V.:4; Other:60; NG/GT:1300] Out: 150 [Urine:150]  Physical Exam: General: NAD  Head: Normocephalic, atraumatic. Moist oral mucosal membranes  Eyes: Anicteric  Lungs:  Course, trach collar  Heart: Regular rate and rhythm  Abdomen:  Soft, nontender, PEG tube  Extremities: Dependent peripheral edema.  Neurologic: Eyes closed, unable to follow commands  Skin: Stage II sacral decubitus  Access: None    Basic Metabolic Panel: Recent Labs  Lab 04/26/23 0615 04/26/23 1441 04/26/23 1645 04/27/23 0559 04/27/23 1703 04/28/23 0436  NA 143 141 141 140 142 141  K 5.6* 5.7* 5.7* 5.3* 5.0 5.1  CL 102 99 99 99 99 99  CO2 29 24 29 28 27 31   GLUCOSE 188* 171* 170* 200* 216* 225*  BUN 126* 126* 167* 138* 144* 147*  CREATININE 3.48* 3.56* 3.66* 3.59* 3.62* 3.54*  CALCIUM 8.4* 8.4* 8.5* 8.3* 8.5* 8.7*  PHOS 7.5*  --   --   --   --   --     Liver Function Tests: Recent Labs  Lab 04/26/23 0615  ALBUMIN 2.4*   No results for input(s): "LIPASE", "AMYLASE" in the last 168 hours. No  results for input(s): "AMMONIA" in the last 168 hours.  CBC: Recent Labs  Lab 04/23/23 0556 04/24/23 0845 04/25/23 0623 04/26/23 0615 04/28/23 0436  WBC 9.2 10.1 10.3 9.8 14.8*  NEUTROABS 5.3  --   --   --   --   HGB 9.7* 9.1* 8.8* 8.9* 8.0*  HCT 32.3* 30.4* 29.2* 29.3* 26.6*  MCV 108.8* 109.4* 109.0* 108.9* 110.8*  PLT 166 159 160 175 185    Cardiac Enzymes: No results for input(s): "CKTOTAL", "CKMB", "CKMBINDEX", "TROPONINI" in the last 168 hours.  BNP: Invalid input(s): "POCBNP"  CBG: Recent Labs  Lab 04/27/23 1546 04/27/23 1937 04/27/23 2348 04/28/23 0352 04/28/23 0725  GLUCAP 186* 196* 251* 205* 228*    Microbiology: Results for orders placed or performed during the hospital encounter of 04/18/23  Resp panel by RT-PCR (RSV, Flu A&B, Covid) Anterior Nasal Swab     Status: None   Collection Time: 04/18/23  3:44 PM   Specimen: Anterior Nasal Swab  Result Value Ref Range Status   SARS Coronavirus 2 by RT PCR NEGATIVE NEGATIVE Final    Comment: (NOTE) SARS-CoV-2 target nucleic acids are NOT DETECTED.  The SARS-CoV-2 RNA is generally detectable in upper respiratory specimens during the acute phase of infection. The lowest concentration of SARS-CoV-2 viral copies this assay can detect is 138 copies/mL. A negative result does not preclude SARS-Cov-2 infection and  should not be used as the sole basis for treatment or other patient management decisions. A negative result may occur with  improper specimen collection/handling, submission of specimen other than nasopharyngeal swab, presence of viral mutation(s) within the areas targeted by this assay, and inadequate number of viral copies(<138 copies/mL). A negative result must be combined with clinical observations, patient history, and epidemiological information. The expected result is Negative.  Fact Sheet for Patients:  BloggerCourse.com  Fact Sheet for Healthcare Providers:   SeriousBroker.it  This test is no t yet approved or cleared by the [place] FDA and  has been authorized for detection and/or diagnosis of SARS-CoV-2 by FDA under an Emergency Use Authorization (EUA). This EUA will remain  in effect (meaning this test can be used) for the duration of the COVID-19 declaration under Section 564(b)(1) of the Act, 21 U.S.C.section 360bbb-3(b)(1), unless the authorization is terminated  or revoked sooner.       Influenza A by PCR NEGATIVE NEGATIVE Final   Influenza B by PCR NEGATIVE NEGATIVE Final    Comment: (NOTE) The Xpert Xpress SARS-CoV-2/FLU/RSV plus assay is intended as an aid in the diagnosis of influenza from Nasopharyngeal swab specimens and should not be used as a sole basis for treatment. Nasal washings and aspirates are unacceptable for Xpert Xpress SARS-CoV-2/FLU/RSV testing.  Fact Sheet for Patients: BloggerCourse.com  Fact Sheet for Healthcare Providers: SeriousBroker.it  This test is not yet approved or cleared by the [place] FDA and has been authorized for detection and/or diagnosis of SARS-CoV-2 by FDA under an Emergency Use Authorization (EUA). This EUA will remain in effect (meaning this test can be used) for the duration of the COVID-19 declaration under Section 564(b)(1) of the Act, 21 U.S.C. section 360bbb-3(b)(1), unless the authorization is terminated or revoked.     Resp Syncytial Virus by PCR NEGATIVE NEGATIVE Final    Comment: (NOTE) Fact Sheet for Patients: BloggerCourse.com  Fact Sheet for Healthcare Providers: SeriousBroker.it  This test is not yet approved or cleared by the [place] FDA and has been authorized for detection and/or diagnosis of SARS-CoV-2 by FDA under an Emergency Use Authorization (EUA). This EUA will remain in effect (meaning this test can be used) for  the duration of the COVID-19 declaration under Section 564(b)(1) of the Act, 21 U.S.C. section 360bbb-3(b)(1), unless the authorization is terminated or revoked.  Performed at [facility], [address] [place], [place] [address]   Blood culture (routine x 2)     Status: None   Collection Time: 04/18/23  5:30 PM   Specimen: BLOOD  Result Value Ref Range Status   Specimen Description BLOOD BLOOD LEFT FOREARM  Final   Special Requests   Final    BOTTLES DRAWN AEROBIC AND ANAEROBIC Blood Culture results may not be optimal due to an inadequate volume of blood received in culture bottles   Culture   Final    NO GROWTH 5 DAYS Performed at [facility], [address] [place], [place] [address]    Report Status 04/23/2023 FINAL  Final  Blood culture (routine x 2)     Status: None   Collection Time: 04/18/23  7:42 PM   Specimen: BLOOD  Result Value Ref Range Status   Specimen Description BLOOD BLOOD RIGHT HAND  Final   Special Requests   Final    BOTTLES DRAWN AEROBIC AND ANAEROBIC Blood Culture adequate volume   Culture   Final    NO GROWTH 5 DAYS Performed at  [facility], [address] [place], [place] [address]    Report Status 04/23/2023 FINAL  Final  Expectorated Sputum Assessment w Gram Stain, Rflx to Resp Cult     Status: None   Collection Time: 04/18/23  8:32 PM   Specimen: Sputum  Result Value Ref Range Status   Specimen Description SPUTUM  Final   Special Requests NONE  Final   Sputum evaluation   Final    THIS SPECIMEN IS ACCEPTABLE FOR SPUTUM CULTURE Performed at [facility], [address], [place], [place] [address]    Report Status 04/18/2023 FINAL  Final  Culture, Respiratory w Gram Stain     Status: None   Collection Time: 04/18/23  8:32 PM   Specimen: SPU  Result Value Ref Range Status   Specimen Description   Final    SPUTUM Performed at [facility], [address], [place], [place] [address]     Special Requests   Final    NONE Reflexed from [id number] Performed at [facility], [address] [place], [place] [address]    Gram Stain   Final    FEW SQUAMOUS EPITHELIAL CELLS PRESENT FEW WBC PRESENT,BOTH PMN AND MONONUCLEAR FEW YEAST FEW GRAM NEGATIVE RODS FEW GRAM POSITIVE RODS FEW GRAM POSITIVE COCCI IN PAIRS Performed at [facility], [address] [place], [place] [address]    Culture   Final    ABUNDANT PROVIDENCIA RETTGERI ABUNDANT PROTEUS MIRABILIS    Report Status 04/21/2023 FINAL  Final   Organism ID, Bacteria PROVIDENCIA RETTGERI  Final   Organism ID, Bacteria PROTEUS MIRABILIS  Final      Susceptibility   Proteus mirabilis - MIC*    AMPICILLIN <=2 SENSITIVE Sensitive     CEFEPIME <=0.12 SENSITIVE Sensitive     CEFTAZIDIME <=1 SENSITIVE Sensitive     CEFTRIAXONE <=0.25 SENSITIVE Sensitive     CIPROFLOXACIN <=0.25 SENSITIVE Sensitive     GENTAMICIN <=1 SENSITIVE Sensitive     IMIPENEM 2 SENSITIVE Sensitive     TRIMETH/SULFA <=20 SENSITIVE Sensitive     AMPICILLIN/SULBACTAM <=2 SENSITIVE Sensitive     PIP/TAZO <=4 SENSITIVE Sensitive ug/mL    * ABUNDANT PROTEUS MIRABILIS   Providencia rettgeri - MIC*    AMPICILLIN >=32 RESISTANT Resistant     CEFEPIME <=0.12 SENSITIVE Sensitive     CEFTAZIDIME <=1 SENSITIVE Sensitive     CEFTRIAXONE <=0.25 SENSITIVE Sensitive     CIPROFLOXACIN <=0.25 SENSITIVE Sensitive     GENTAMICIN <=1 SENSITIVE Sensitive     IMIPENEM 2 SENSITIVE Sensitive     TRIMETH/SULFA <=20 SENSITIVE Sensitive     AMPICILLIN/SULBACTAM 16 INTERMEDIATE Intermediate     PIP/TAZO <=4 SENSITIVE Sensitive ug/mL    * ABUNDANT PROVIDENCIA RETTGERI  MRSA Next Gen by PCR, Nasal     Status: None   Collection Time: 04/19/23 12:52 AM   Specimen: Nasal Mucosa; Nasal Swab  Result Value Ref Range Status   MRSA by PCR Next Gen NOT DETECTED NOT DETECTED Final    Comment: (NOTE) The GeneXpert MRSA Assay (FDA approved for NASAL specimens only), is  one component of a comprehensive MRSA colonization surveillance program. It is not intended to diagnose MRSA infection nor to guide or monitor treatment for MRSA infections. Test performance is not FDA approved in patients less than 71 years old. Performed at [facility], [address] [place], [place] [address]   Culture, blood (Routine X 2) w Reflex to ID Panel  Status: None   Collection Time: 04/22/23 12:27 PM   Specimen: BLOOD  Result Value Ref Range Status   Specimen Description BLOOD BLOOD LEFT HAND  Final   Special Requests   Final    BOTTLES DRAWN AEROBIC AND ANAEROBIC Blood Culture adequate volume   Culture   Final    NO GROWTH 5 DAYS Performed at [facility], [address], [place], [place] [address]    Report Status 04/27/2023 FINAL  Final  Culture, blood (Routine X 2) w Reflex to ID Panel     Status: None   Collection Time: 04/22/23 12:37 PM   Specimen: BLOOD  Result Value Ref Range Status   Specimen Description BLOOD BLOOD RIGHT HAND  Final   Special Requests   Final    BOTTLES DRAWN AEROBIC AND ANAEROBIC Blood Culture adequate volume   Culture   Final    NO GROWTH 5 DAYS Performed at [facility], [address], [place], [place] [address]    Report Status 04/27/2023 FINAL  Final  Culture, Respiratory w Gram Stain     Status: None   Collection Time: 04/22/23  1:22 PM   Specimen: Tracheal Aspirate  Result Value Ref Range Status   Specimen Description   Final    TRACHEAL ASPIRATE Performed at [facility], [address], [place], [place] [address]    Special Requests   Final    NONE Performed at [facility], [address] [place], [place] [address]    Gram Stain   Final    ABUNDANT SQUAMOUS EPITHELIAL CELLS PRESENT ABUNDANT WBC PRESENT, PREDOMINANTLY PMN FEW GRAM NEGATIVE RODS RARE GRAM POSITIVE COCCI IN PAIRS Performed at [facility], [address], [place], [place] [address]     Culture   Final    MODERATE PSEUDOMONAS AERUGINOSA MODERATE PROTEUS MIRABILIS MODERATE ENTEROCOCCUS FAECALIS    Report Status 04/27/2023 FINAL  Final   Organism ID, Bacteria PSEUDOMONAS AERUGINOSA  Final   Organism ID, Bacteria PROTEUS MIRABILIS  Final   Organism ID, Bacteria ENTEROCOCCUS FAECALIS  Final      Susceptibility   Enterococcus faecalis - MIC*    AMPICILLIN <=2 SENSITIVE Sensitive     VANCOMYCIN 2 SENSITIVE Sensitive     GENTAMICIN SYNERGY SENSITIVE Sensitive     * MODERATE ENTEROCOCCUS FAECALIS   Pseudomonas aeruginosa - MIC*    CEFTAZIDIME 4 SENSITIVE Sensitive     CIPROFLOXACIN >=4 RESISTANT Resistant     GENTAMICIN 8 INTERMEDIATE Intermediate     IMIPENEM 2 SENSITIVE Sensitive     PIP/TAZO 16 SENSITIVE Sensitive ug/mL    * MODERATE PSEUDOMONAS AERUGINOSA   Proteus mirabilis - MIC*    AMPICILLIN <=2 SENSITIVE Sensitive     CEFEPIME <=0.12 SENSITIVE Sensitive     CEFTAZIDIME <=1 SENSITIVE Sensitive     CEFTRIAXONE <=0.25 SENSITIVE Sensitive     CIPROFLOXACIN <=0.25 SENSITIVE Sensitive     GENTAMICIN <=1 SENSITIVE Sensitive     IMIPENEM 2 SENSITIVE Sensitive     TRIMETH/SULFA <=20 SENSITIVE Sensitive     AMPICILLIN/SULBACTAM <=2 SENSITIVE Sensitive     PIP/TAZO <=4 SENSITIVE Sensitive ug/mL    * MODERATE PROTEUS MIRABILIS  Resp panel by RT-PCR (RSV, Flu A&B, Covid) Anterior Nasal Swab     Status: None   Collection Time: 04/22/23  5:53 PM   Specimen: Anterior Nasal Swab  Result Value Ref Range Status   SARS Coronavirus 2 by RT PCR NEGATIVE NEGATIVE Final    Comment: (  NOTE) SARS-CoV-2 target nucleic acids are NOT DETECTED.  The SARS-CoV-2 RNA is generally detectable in upper respiratory specimens during the acute phase of infection. The lowest concentration of SARS-CoV-2 viral copies this assay can detect is 138 copies/mL. A negative result does not preclude SARS-Cov-2 infection and should not be used as the sole basis for treatment or other patient management  decisions. A negative result may occur with  improper specimen collection/handling, submission of specimen other than nasopharyngeal swab, presence of viral mutation(s) within the areas targeted by this assay, and inadequate number of viral copies(<138 copies/mL). A negative result must be combined with clinical observations, patient history, and epidemiological information. The expected result is Negative.  Fact Sheet for Patients:  BloggerCourse.com  Fact Sheet for Healthcare Providers:  SeriousBroker.it  This test is no t yet approved or cleared by the [place] FDA and  has been authorized for detection and/or diagnosis of SARS-CoV-2 by FDA under an Emergency Use Authorization (EUA). This EUA will remain  in effect (meaning this test can be used) for the duration of the COVID-19 declaration under Section 564(b)(1) of the Act, 21 U.S.C.section 360bbb-3(b)(1), unless the authorization is terminated  or revoked sooner.       Influenza A by PCR NEGATIVE NEGATIVE Final   Influenza B by PCR NEGATIVE NEGATIVE Final    Comment: (NOTE) The Xpert Xpress SARS-CoV-2/FLU/RSV plus assay is intended as an aid in the diagnosis of influenza from Nasopharyngeal swab specimens and should not be used as a sole basis for treatment. Nasal washings and aspirates are unacceptable for Xpert Xpress SARS-CoV-2/FLU/RSV testing.  Fact Sheet for Patients: BloggerCourse.com  Fact Sheet for Healthcare Providers: SeriousBroker.it  This test is not yet approved or cleared by the [place] FDA and has been authorized for detection and/or diagnosis of SARS-CoV-2 by FDA under an Emergency Use Authorization (EUA). This EUA will remain in effect (meaning this test can be used) for the duration of the COVID-19 declaration under Section 564(b)(1) of the Act, 21 U.S.C. section 360bbb-3(b)(1), unless the  authorization is terminated or revoked.     Resp Syncytial Virus by PCR NEGATIVE NEGATIVE Final    Comment: (NOTE) Fact Sheet for Patients: BloggerCourse.com  Fact Sheet for Healthcare Providers: SeriousBroker.it  This test is not yet approved or cleared by the [place] FDA and has been authorized for detection and/or diagnosis of SARS-CoV-2 by FDA under an Emergency Use Authorization (EUA). This EUA will remain in effect (meaning this test can be used) for the duration of the COVID-19 declaration under Section 564(b)(1) of the Act, 21 U.S.C. section 360bbb-3(b)(1), unless the authorization is terminated or revoked.  Performed at [facility], [address], [place], [place] [address]   Urine Culture (for pregnant, neutropenic or urologic patients or patients with an indwelling urinary catheter)     Status: None   Collection Time: 04/24/23  7:35 PM   Specimen: Urine, Random  Result Value Ref Range Status   Specimen Description   Final    URINE, RANDOM Performed at [facility], [address], [place], [place] [address]    Special Requests   Final    NONE Performed at [facility], [address] [place], [place] [address]    Culture   Final    NO GROWTH Performed at [facility], [address], [place], [place] [address]    Report Status 04/25/2023 FINAL  Final  Culture, BAL-quantitative w Gram Stain  Status: None (Preliminary result)   Collection Time: 04/27/23  3:32 PM   Specimen: Bronchoalveolar Lavage; Respiratory  Result Value Ref Range Status   Specimen Description   Final    BRONCHIAL ALVEOLAR LAVAGE Performed at [facility], [address], [place], [place] [address]    Special Requests   Final    NONE Performed at [facility], [address] [place], [place] [address]    Gram Stain   Final    FEW WBC PRESENT, PREDOMINANTLY PMN FEW GRAM  NEGATIVE RODS Performed at [facility], [address], [place], [place] [address]    Culture PENDING  Incomplete   Report Status PENDING  Incomplete  MRSA Next Gen by PCR, Nasal     Status: None   Collection Time: 04/27/23  4:50 PM   Specimen: Nasal Mucosa; Nasal Swab  Result Value Ref Range Status   MRSA by PCR Next Gen NOT DETECTED NOT DETECTED Final    Comment: (NOTE) The GeneXpert MRSA Assay (FDA approved for NASAL specimens only), is one component of a comprehensive MRSA colonization surveillance program. It is not intended to diagnose MRSA infection nor to guide or monitor treatment for MRSA infections. Test performance is not FDA approved in patients less than 53 years old. Performed at [facility], [address] [place], [place] [address]     Coagulation Studies: No results for input(s): "LABPROT", "INR" in the last 72 hours.  Urinalysis: No results for input(s): "COLORURINE", "LABSPEC", "PHURINE", "GLUCOSEU", "HGBUR", "BILIRUBINUR", "KETONESUR", "PROTEINUR", "UROBILINOGEN", "NITRITE", "LEUKOCYTESUR" in the last 72 hours.  Invalid input(s): "APPERANCEUR"     Imaging: DG Chest Port 1 View Result Date: 04/26/2023 CLINICAL DATA:  Hypoxia EXAM: PORTABLE CHEST 1 VIEW COMPARISON:  CT 04/23/2018 FINDINGS: Tracheostomy tube noted. Enlarged cardiac silhouette. Low lung volumes. Central venous congestion. LEFT basilar atelectasis. IMPRESSION: 1. Low lung volumes and LEFT basilar atelectasis. 2. Cardiomegaly and central venous congestion. Electronically Signed   By: Genevive Bi M.D.   On: 04/26/2023 20:22     Medications:    cefTAZidime (FORTAZ)  IV Stopped (04/28/23 0532)   feeding supplement (NEPRO CARB STEADY) 50 mL/hr at 04/28/23 0800   furosemide (LASIX) 200 mg in dextrose 5 % 100 mL (2 mg/mL) infusion 4 mg/hr (04/28/23 0800)    apixaban  5 mg Per Tube BID   atropine  2 drop Sublingual QID   budesonide (PULMICORT) nebulizer solution  0.25 mg  Nebulization BID   carvedilol  6.25 mg Per Tube BID WC   Chlorhexidine Gluconate Cloth  6 each Topical Daily   clonazePAM  0.5 mg Per Tube BID   famotidine  20 mg Per Tube BID   feeding supplement (PROSource TF20)  60 mL Per Tube Daily   free water  200 mL Per Tube Q6H   glycopyrrolate  1 mg Per Tube TID   insulin aspart  0-9 Units Subcutaneous Q4H   insulin glargine-yfgn  13 Units Subcutaneous QHS   ipratropium-albuterol  3 mL Nebulization TID   levETIRAcetam  750 mg Per Tube BID   metoCLOPramide  10 mg Per Tube Q8H   nutrition supplement (JUVEN)  1 packet Per Tube BID BM   oxyCODONE  2.5 mg Per Tube Q8H   sodium zirconium cyclosilicate  10 g Per Tube Daily   valproic acid  750 mg Per Tube TID   acetaminophen (TYLENOL) oral liquid 160 mg/5 mL, albuterol, guaiFENesin, iohexol, LORazepam, ondansetron (ZOFRAN) IV, polyethylene glycol, sodium chloride HYPERTONIC  Assessment/ Plan:  Billy Cherry is a 53 y.o.  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  Acute kidney injury, secondary to inflammation and infection resulting in ATN.  Imaging-no hydronephrosis noted. Urinalysis-04/24/2023-small hemoglobin 11-20 RBCs, rare WBCs Likely ATN secondary to concurrent illness,  pneumonia. Patient is oliguric.   Creatinine continues to improve. Urine output remains acceptable. No acute indication for dialysis. Will continue to monitor. Supportive care and treatment of underling concern.   Lab Results  Component Value Date   CREATININE 3.54 (H) 04/28/2023   CREATININE 3.62 (H) 04/27/2023   CREATININE 3.59 (H) 04/27/2023    Intake/Output Summary (Last 24 hours) at 04/28/2023 0920 Last data filed at 04/28/2023 0800 Gross per 24 hour  Intake 3487.48 ml  Output 2640 ml  Net 847.48 ml    2.  Hyperkalemia Potassium 5.1.  .    LOS: 10 Billy Cherry 2/23/20259:20 AM

## 2023-04-28 NOTE — Assessment & Plan Note (Signed)
 Ferritin 53 and last hemoglobin 8.0.

## 2023-04-28 NOTE — Progress Notes (Addendum)
 Progress Note   Patient: Billy Cherry ZOX:[id number] DOB: 07/03/1970 DOA: 04/18/2023     10 DOS: the patient was seen and examined on 04/28/2023   Brief hospital course: 53 y.o. 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   Patient is nonverbal.  Per her wife at the bedside, patient has increased shortness of breath with increased tracheal secretion in the past several days.  No fever or chills.  Patient has increased fluid retention, particularly in arms per her wife.  Patient does not see to have chest pain, nausea, vomiting, abdominal pain per his wife.  Patient was found to have oxygen desaturation to 88% on room air, which improved to 99% of 4L -6L oxygen in ED.    Data reviewed independently and ED Course: pt was found to have BNP 349, potassium 5.8, GFR> 60, WBC 17.1, negative PCR for flu, COVID and RSV, troponin 13 --> 15, procalcitonin 0.17.  Chest x-ray showed bilateral basilar opacity (the left is worse than the right).  Temperature 99.1, blood pressure 125/100, heart rate of 108, RR 22.  Patient is admitted to PCU as inpatient.  2/14.  Patient on 40% trach collar.  Patient on IV Lasix to remove fluid and IV antibiotics for pneumonia. 2/15.  Creatinine went up to 1.6 will decrease Lasix down to 20 mg IV twice daily.  Switch tube feedings over to Glucerna that he takes at home. Dietitian to adjust rate to goal and free water. 2/16.  Creatinine up at 1.66.  Will hold Lasix for the rest of today and recheck creatinine tomorrow.  Try to get patient down to his baseline oxygen.  Echocardiogram shows an EF of 30 to 35% stable from previous. 2/17.  Creatinine up at 1.85.  Patient spiked a fever of 102.  Will get blood cultures and change antibiotics to Zithromax and Zosyn.   Chest x-ray showing left lower lobe pneumonia.  Repeat COVID RSV and influenza negative. 2/18.  Creatinine up at 2.22.  Maintenance fluids started.  Added vancomycin. 2/19: Creatinine continued to get worse, currently at 2.91 with BUN of 96, IV Lasix has been discontinued and he was getting some IV fluid.  No significant volume on bladder scan.  Continue to have fever, maximum recorded at 101.8 over the past 24-hour.  MRSA PCR was negative so vancomycin is being discontinued.  Blood cultures from 12/17 remain negative.  Repeat sputum cultures pending consulting infectious disease, CT chest, abdomen and pelvis, UA with urine culture was ordered. EEG with severe diffuse encephalopathy, no seizure or definitive epileptiform discharges were noted. 2/20: Afebrile this morning but maximum temperature recorded was 100.6 over the past 24-hour.  Worsening renal function, UA with protein urea and rare bacteria, urine cultures pending, CT chest, abdomen and pelvis with cardiomegaly, small bilateral pleural effusion and mild septal thickening, bilateral perihilar groundglass density suggestive of some pulmonary edema.  Also has a confluent consolidation in the left lower lobe and partial right lower lobe suspicious for pneumonia or aspiration. No evidence of any acute intra-abdominal abnormality. Stopping gentle IV fluid and ordered 1 dose of IV Lasix 40 mg Renal ultrasound with some renal echogenicity, no hydronephrosis. Also consulted nephrology 2/21: Remained afebrile, worsening hyperkalemia with potassium at 5.6, worsening renal function with BUN 126 and creatinine 3.48, urine cultures negative, repeat sputum cultures with  Pseudomonas and Proteus-continuing Zosyn.  Ordered some Lokelma, nephrology is recommending switching Glucerna with Nepro due to high potassium content.  Need to monitor potassium very closely, patient might require dialysis if renal function and potassium continue to get worse.  Patient with  significant life limiting comorbidities, seems like multiorgan dysfunction, palliative care was again offered but wife declined.  2/22: Patient with concern of worsening hypoxia requiring higher level of FiO2 and was placed on 10 L of oxygen.  Intermittently having apneic episodes.  Repeat chest x-ray again with concern of pulmonary vascular congestion, received 1 dose of IV Lasix 80 mg, renal function seems plateaued now.  After discussing with nephrology patient was started on Lasix infusion. Also discussed with PCCM and they want him to transfer to ICU for close monitoring as he is high risk to go on a ventilator. Ordered ABG and repeating lactic acid  2/23: Patient underwent bronchoscopy with removal of mucous plugging with pulmonary yesterday.  BAL was sent for culture, remained on 12 L of oxygen with 50% FiO2. Renal function stable with GFR of 20, worsening BUN at 146.  UOP of 1890 recorded over the past 24-hour, patient still quite edematous and on Lasix infusion.  Patient also had diarrhea yesterday, Foley catheter and rectal tube was placed in ICU   Assessment and Plan: * Multifocal pneumonia   Initial sputum culture grew Proteus and providencia, which were treated appropriately. Patient had a fever of 102 on 2/17.  Antibiotics switched to Zosyn, Zithromax and added vancomycin back.  Blood cultures so far negative.  Repeat COVID influenza and RSV negative.  Sputum culture growing Pseudomonas and Proteus -As he continued to have fever spikes-ID was consulted -Repeat blood and urine cultures negative,  CT chest abdomen and pelvis with concern of bilateral pneumonia. -Vancomycin was discontinued as MRSA PCR was negative -Continue with Zosyn until 04/28/2023 as recommended by ID -S/p bronchoscopy with removal of mucous plugging and BAL was sent for culture.  Acute on chronic systolic CHF (congestive heart failure) ([name]) Gentle fluids started with worsening kidney function.  Patient on tube  feedings.  Continue Coreg.  Repeat echo showed a stable EF of 30 to 35%.  Weight on 2/13 was 263 pounds and not sure how accurate that weight is.  Weight on 2/16 was 232.37.  Today's weight 248.46.  Continue to monitor. pulmonary edema on CT chest,  -Currently on Lasix infusion.  Acute kidney injury superimposed on CKD ([name]) AKI on CKD stage II.  Creatinine now at least plateaued, currently at 3.54 .CT chest with concern of some pulmonary edema.  No significant urinary retention and renal ultrasound without any hydronephrosis. Nephrology consult -Patient was started on Lasix infusion for gentle diuresis -Monitor renal function -Strict intake and output -Avoid nephrotoxins  HCAP (healthcare-associated pneumonia)-resolved as of 04/19/2023 Patient started on Maxipime and Flagyl.  Vancomycin was discontinued with MRSA PE CR being negative  Acute on chronic hypoxic respiratory failure ([name]) Patient with slowly worsening hypoxia and now started getting apneic spells.  No significant secretions on suctioning.   S/p bronchoscopy with removal of mucous plugging, BAL was sent for cultures-pending. Remained on 12 L of oxygen, FiO2 improved from 100 to 50 -Further respiratory management per ICU   Hyperkalemia Improved with potassium of 5.1 today -Continue Lokelma -Switched dietary formula from Glucerna to Nepro -Continue to monitor  Seizure disorder ([name]) Continue Keppra and Depakote.  Will get an EEG.  Essential hypertension Continue Coreg  Pulmonary embolus ([name]) On Eliquis  DVT (deep venous thrombosis) ([name]) On Eliquis  S/P percutaneous endoscopic gastrostomy (PEG) tube placement ([name]) Glucerna was switched with Nepro due to worsening renal function and hyperkalemia. Now with hyperglycemia -Increasing Semglee to 13 unit and making SSI moderate  Tracheostomy dependent ([name]) On 40% oxygen, 6 L flow-at baseline Currently worsening respiratory status  Arterial  hypotension Hypotension resolved  Iron deficiency anemia Ferritin 53 and last hemoglobin 8.0.  Anoxic encephalopathy ([name]) Supportive care      Subjective: Patient was seen and examined today.  Clinically remained the same, saturating 100% on 12 L of oxygen with FiO2 of 50%.  Physical Exam: Vitals:   04/28/23 0800 04/28/23 0926 04/28/23 1034 04/28/23 1200  BP:      Pulse:   (!) 106   Resp:   15   Temp: 98.3 F (36.8 C)   98.4 F (36.9 C)  TempSrc: Oral   Oral  SpO2:  94% 96%   Weight:      Height:       General.  Obese, minimally responsive gentleman, in no acute distress. Pulmonary.  Lungs clear bilaterally, normal respiratory effort. CV.  Regular rate and rhythm, no JVD, rub or murmur. Abdomen.  Soft, nontender, nondistended, BS positive. CNS.  Alert and oriented .  No focal neurologic deficit. Extremities.  No edema, no cyanosis, pulses intact and symmetrical.  Data Reviewed: Prior data reviewed  Family Communication: Talked with wife on phone.  Disposition: Status is: Inpatient Remains inpatient appropriate because: Severity of illness  Planned Discharge Destination: Home with Home Health  DVT prophylaxis.  Eliquis Time spent: 50 minutes  This record has been created using Conservation officer, historic buildings. Errors have been sought and corrected,but may not always be located. Such creation errors do not reflect on the standard of care.   Author: [name], MD 04/28/2023 12:26 PM  For on call review [email or web address].

## 2023-04-29 ENCOUNTER — Inpatient Hospital Stay: Payer: Medicaid Other

## 2023-04-29 DIAGNOSIS — N179 Acute kidney failure, unspecified: Secondary | ICD-10-CM | POA: Diagnosis not present

## 2023-04-29 DIAGNOSIS — E875 Hyperkalemia: Secondary | ICD-10-CM | POA: Diagnosis not present

## 2023-04-29 DIAGNOSIS — I5023 Acute on chronic systolic (congestive) heart failure: Secondary | ICD-10-CM | POA: Diagnosis not present

## 2023-04-29 DIAGNOSIS — J189 Pneumonia, unspecified organism: Secondary | ICD-10-CM | POA: Diagnosis not present

## 2023-04-29 LAB — RENAL FUNCTION PANEL
Albumin: 2.1 g/dL — ABNORMAL LOW (ref 3.5–5.0)
Anion gap: 12 (ref 5–15)
BUN: 177 mg/dL — ABNORMAL HIGH (ref 6–20)
CO2: 33 mmol/L — ABNORMAL HIGH (ref 22–32)
Calcium: 8.9 mg/dL (ref 8.9–10.3)
Chloride: 99 mmol/L (ref 98–111)
Creatinine, Ser: 3.38 mg/dL — ABNORMAL HIGH (ref 0.61–1.24)
GFR, Estimated: 21 mL/min — ABNORMAL LOW (ref >60)
Glucose, Bld: 293 mg/dL — ABNORMAL HIGH (ref 70–99)
Phosphorus: 6.6 mg/dL — ABNORMAL HIGH (ref 2.5–4.6)
Potassium: 4.8 mmol/L (ref 3.5–5.1)
Sodium: 144 mmol/L (ref 135–145)

## 2023-04-29 LAB — GLUCOSE, CAPILLARY
Glucose-Capillary: 178 mg/dL — ABNORMAL HIGH (ref 70–99)
Glucose-Capillary: 179 mg/dL — ABNORMAL HIGH (ref 70–99)
Glucose-Capillary: 198 mg/dL — ABNORMAL HIGH (ref 70–99)
Glucose-Capillary: 221 mg/dL — ABNORMAL HIGH (ref 70–99)
Glucose-Capillary: 248 mg/dL — ABNORMAL HIGH (ref 70–99)
Glucose-Capillary: 250 mg/dL — ABNORMAL HIGH (ref 70–99)
Glucose-Capillary: 251 mg/dL — ABNORMAL HIGH (ref 70–99)
Glucose-Capillary: 262 mg/dL — ABNORMAL HIGH (ref 70–99)
Glucose-Capillary: 296 mg/dL — ABNORMAL HIGH (ref 70–99)
Glucose-Capillary: 354 mg/dL — ABNORMAL HIGH (ref 70–99)

## 2023-04-29 LAB — BASIC METABOLIC PANEL
Anion gap: 14 (ref 5–15)
BUN: 185 mg/dL — ABNORMAL HIGH (ref 6–20)
CO2: 33 mmol/L — ABNORMAL HIGH (ref 22–32)
Calcium: 9.1 mg/dL (ref 8.9–10.3)
Chloride: 97 mmol/L — ABNORMAL LOW (ref 98–111)
Creatinine, Ser: 3.52 mg/dL — ABNORMAL HIGH (ref 0.61–1.24)
GFR, Estimated: 20 mL/min — ABNORMAL LOW (ref >60)
Glucose, Bld: 304 mg/dL — ABNORMAL HIGH (ref 70–99)
Potassium: 4.6 mmol/L (ref 3.5–5.1)
Sodium: 144 mmol/L (ref 135–145)

## 2023-04-29 LAB — CBC
HCT: 25.7 % — ABNORMAL LOW (ref 39.0–52.0)
Hemoglobin: 7.6 g/dL — ABNORMAL LOW (ref 13.0–17.0)
MCH: 33.2 pg (ref 26.0–34.0)
MCHC: 29.6 g/dL — ABNORMAL LOW (ref 30.0–36.0)
MCV: 112.2 fL — ABNORMAL HIGH (ref 80.0–100.0)
Platelets: 195 10*3/uL (ref 150–400)
RBC: 2.29 MIL/uL — ABNORMAL LOW (ref 4.22–5.81)
RDW: 14.6 % (ref 11.5–15.5)
WBC: 11.3 10*3/uL — ABNORMAL HIGH (ref 4.0–10.5)
nRBC: 1.8 % — ABNORMAL HIGH (ref 0.0–0.2)

## 2023-04-29 LAB — LACTIC ACID, PLASMA: Lactic Acid, Venous: 1.5 mmol/L (ref 0.5–1.9)

## 2023-04-29 LAB — TROPONIN I (HIGH SENSITIVITY)
Troponin I (High Sensitivity): 65 ng/L — ABNORMAL HIGH (ref <18)
Troponin I (High Sensitivity): 72 ng/L — ABNORMAL HIGH (ref <18)

## 2023-04-29 LAB — PHOSPHORUS: Phosphorus: 6.2 mg/dL — ABNORMAL HIGH (ref 2.5–4.6)

## 2023-04-29 LAB — MAGNESIUM: Magnesium: 3.6 mg/dL — ABNORMAL HIGH (ref 1.7–2.4)

## 2023-04-29 MED ORDER — FREE WATER
100.0000 mL | Freq: Four times a day (QID) | Status: DC
  Administered 2023-04-29: 100 mL

## 2023-04-29 MED ORDER — INSULIN ASPART 100 UNIT/ML IJ SOLN
0.0000 [IU] | INTRAMUSCULAR | Status: DC
  Administered 2023-04-29 (×2): 11 [IU] via SUBCUTANEOUS
  Administered 2023-04-30: 15 [IU] via SUBCUTANEOUS
  Administered 2023-04-30 (×3): 11 [IU] via SUBCUTANEOUS
  Administered 2023-04-30: 20 [IU] via SUBCUTANEOUS
  Filled 2023-04-29 (×7): qty 1

## 2023-04-29 MED ORDER — INSULIN ASPART 100 UNIT/ML IJ SOLN
3.0000 [IU] | INTRAMUSCULAR | Status: DC
  Administered 2023-04-29 (×2): 3 [IU] via SUBCUTANEOUS
  Filled 2023-04-29 (×2): qty 1

## 2023-04-29 MED ORDER — INSULIN ASPART 100 UNIT/ML IJ SOLN
5.0000 [IU] | INTRAMUSCULAR | Status: DC
  Administered 2023-04-30: 5 [IU] via SUBCUTANEOUS

## 2023-04-29 MED ORDER — PIPERACILLIN-TAZOBACTAM 3.375 G IVPB
3.3750 g | Freq: Three times a day (TID) | INTRAVENOUS | Status: DC
  Administered 2023-04-29 – 2023-05-01 (×6): 3.375 g via INTRAVENOUS
  Filled 2023-04-29 (×6): qty 50

## 2023-04-29 MED ORDER — FREE WATER
30.0000 mL | Status: DC
  Administered 2023-04-29 – 2023-05-01 (×11): 30 mL

## 2023-04-29 NOTE — Inpatient Diabetes Management (Signed)
 Inpatient Diabetes Program Recommendations  AACE/ADA: New Consensus Statement on Inpatient Glycemic Control (2015)  Target Ranges:  Prepandial:   less than 140 mg/dL      Peak postprandial:   less than 180 mg/dL (1-2 hours)      Critically ill patients:  140 - 180 mg/dL   Lab Results  Component Value Date   GLUCAP 250 (H) 04/29/2023   HGBA1C 5.2 04/21/2023    Review of Glycemic Control  Latest Reference Range & Units 04/28/23 15:45 04/28/23 19:29 04/28/23 23:57 04/29/23 03:18 04/29/23 07:43 04/29/23 10:58  Glucose-Capillary 70 - 99 mg/dL 629 (H) 528 (H) 413 (H) 262 (H) 248 (H) 250 (H)   Diabetes history: DM 2 Outpatient Diabetes medications:  Lantus 15 units q HS Metformin 500 mg q AM Current orders for Inpatient glycemic control:  Novolog 0-15 units q 4 hours Semglee 13 units daily Nepro 50 ml/hr  Inpatient Diabetes Program Recommendations:    Consider adding Novolog tube feed coverage 3 units q 4 hours (hold if feeds held for any reason).   Thanks,  Lorenza Cambridge, RN, BC-ADM Inpatient Diabetes Coordinator Pager [phone]  (8a-5p)

## 2023-04-29 NOTE — Progress Notes (Signed)
 [facility]  ROUNDING NOTE   Subjective:   Patient resting quietly Eyes closed No family present   Creatinine 3.38 Tube feeds 50 ml/hr Urine output  in past 24 hours . IV furosemide drip  Objective:  Vital signs in last 24 hours:  Temp:  [97.9 F (36.6 C)-98.4 F (36.9 C)] 97.9 F (36.6 C) (02/24 0300) Pulse Rate:  [53-112] 107 (02/24 1000) Resp:  [14-27] 14 (02/24 1000) BP: (117-135)/(79-91) 133/88 (02/24 1000) SpO2:  [80 %-98 %] 94 % (02/24 1000) FiO2 (%):  [50 %-70 %] 70 % (02/24 0813)  Weight change:  Filed Weights   04/25/23 0500 04/26/23 0456 04/27/23 0500  Weight: 117.2 kg 117.5 kg 119.7 kg    Intake/Output: I/O last 3 completed shifts: In: 3216.2 [I.V.:45.4; Other:510; NG/GT:2535.8; IV Piggyback:124.9] Out: 2780 [Urine:2080; Stool:700]   Intake/Output this shift:  Total I/O In: 586.7 [I.V.:29; NG/GT:367.5; IV Piggyback:190.2] Out: 125 [Urine:125]  Physical Exam: General: NAD  Head: Normocephalic, atraumatic. Moist oral mucosal membranes  Eyes: Anicteric  Lungs:  Course, trach collar  Heart: Regular rate and rhythm  Abdomen:  Soft, nontender, PEG tube  Extremities: Dependent peripheral edema.  Neurologic: Eyes closed, unable to follow commands  Skin: Stage II sacral decubitus  Access: None    Basic Metabolic Panel: Recent Labs  Lab 04/26/23 0615 04/26/23 1441 04/26/23 1645 04/27/23 0559 04/27/23 1703 04/28/23 0436 04/29/23 0910  NA 143   < > 141 140 142 141 144  K 5.6*   < > 5.7* 5.3* 5.0 5.1 4.8  CL 102   < > 99 99 99 99 99  CO2 29   < > 29 28 27 31  33*  GLUCOSE 188*   < > 170* 200* 216* 225* 293*  BUN 126*   < > 167* 138* 144* 147* 177*  CREATININE 3.48*   < > 3.66* 3.59* 3.62* 3.54* 3.38*  CALCIUM 8.4*   < > 8.5* 8.3* 8.5* 8.7* 8.9  PHOS 7.5*  --   --   --   --   --  6.6*   < > = values in this interval not displayed.    Liver Function Tests: Recent Labs  Lab 04/26/23 0615 04/29/23 0910  ALBUMIN 2.4*  2.1*   No results for input(s): "LIPASE", "AMYLASE" in the last 168 hours. No results for input(s): "AMMONIA" in the last 168 hours.  CBC: Recent Labs  Lab 04/23/23 0556 04/24/23 0845 04/25/23 0623 04/26/23 0615 04/28/23 0436 04/29/23 0910  WBC 9.2 10.1 10.3 9.8 14.8* 11.3*  NEUTROABS 5.3  --   --   --   --   --   HGB 9.7* 9.1* 8.8* 8.9* 8.0* 7.6*  HCT 32.3* 30.4* 29.2* 29.3* 26.6* 25.7*  MCV 108.8* 109.4* 109.0* 108.9* 110.8* 112.2*  PLT 166 159 160 175 185 195    Cardiac Enzymes: No results for input(s): "CKTOTAL", "CKMB", "CKMBINDEX", "TROPONINI" in the last 168 hours.  BNP: Invalid input(s): "POCBNP"  CBG: Recent Labs  Lab 04/28/23 1545 04/28/23 1929 04/28/23 2357 04/29/23 0318 04/29/23 0743  GLUCAP 269* 315* 305* 262* 248*    Microbiology: Results for orders placed or performed during the hospital encounter of 04/18/23  Resp panel by RT-PCR (RSV, Flu A&B, Covid) Anterior Nasal Swab     Status: None   Collection Time: 04/18/23  3:44 PM   Specimen: Anterior Nasal Swab  Result Value Ref Range Status   SARS Coronavirus 2 by RT PCR NEGATIVE NEGATIVE Final    Comment: (  NOTE) SARS-CoV-2 target nucleic acids are NOT DETECTED.  The SARS-CoV-2 RNA is generally detectable in upper respiratory specimens during the acute phase of infection. The lowest concentration of SARS-CoV-2 viral copies this assay can detect is 138 copies/mL. A negative result does not preclude SARS-Cov-2 infection and should not be used as the sole basis for treatment or other patient management decisions. A negative result may occur with  improper specimen collection/handling, submission of specimen other than nasopharyngeal swab, presence of viral mutation(s) within the areas targeted by this assay, and inadequate number of viral copies(<138 copies/mL). A negative result must be combined with clinical observations, patient history, and epidemiological information. The expected result is  Negative.  Fact Sheet for Patients:  BloggerCourse.com  Fact Sheet for Healthcare Providers:  SeriousBroker.it  This test is no t yet approved or cleared by the [place] FDA and  has been authorized for detection and/or diagnosis of SARS-CoV-2 by FDA under an Emergency Use Authorization (EUA). This EUA will remain  in effect (meaning this test can be used) for the duration of the COVID-19 declaration under Section 564(b)(1) of the Act, 21 U.S.C.section 360bbb-3(b)(1), unless the authorization is terminated  or revoked sooner.       Influenza A by PCR NEGATIVE NEGATIVE Final   Influenza B by PCR NEGATIVE NEGATIVE Final    Comment: (NOTE) The Xpert Xpress SARS-CoV-2/FLU/RSV plus assay is intended as an aid in the diagnosis of influenza from Nasopharyngeal swab specimens and should not be used as a sole basis for treatment. Nasal washings and aspirates are unacceptable for Xpert Xpress SARS-CoV-2/FLU/RSV testing.  Fact Sheet for Patients: BloggerCourse.com  Fact Sheet for Healthcare Providers: SeriousBroker.it  This test is not yet approved or cleared by the [place] FDA and has been authorized for detection and/or diagnosis of SARS-CoV-2 by FDA under an Emergency Use Authorization (EUA). This EUA will remain in effect (meaning this test can be used) for the duration of the COVID-19 declaration under Section 564(b)(1) of the Act, 21 U.S.C. section 360bbb-3(b)(1), unless the authorization is terminated or revoked.     Resp Syncytial Virus by PCR NEGATIVE NEGATIVE Final    Comment: (NOTE) Fact Sheet for Patients: BloggerCourse.com  Fact Sheet for Healthcare Providers: SeriousBroker.it  This test is not yet approved or cleared by the [place] FDA and has been authorized for detection and/or diagnosis of  SARS-CoV-2 by FDA under an Emergency Use Authorization (EUA). This EUA will remain in effect (meaning this test can be used) for the duration of the COVID-19 declaration under Section 564(b)(1) of the Act, 21 U.S.C. section 360bbb-3(b)(1), unless the authorization is terminated or revoked.  Performed at [facility], [address] [place], [place] [address]   Blood culture (routine x 2)     Status: None   Collection Time: 04/18/23  5:30 PM   Specimen: BLOOD  Result Value Ref Range Status   Specimen Description BLOOD BLOOD LEFT FOREARM  Final   Special Requests   Final    BOTTLES DRAWN AEROBIC AND ANAEROBIC Blood Culture results may not be optimal due to an inadequate volume of blood received in culture bottles   Culture   Final    NO GROWTH 5 DAYS Performed at [facility], [address], [place], [place] [address]    Report Status 04/23/2023 FINAL  Final  Blood culture (routine x 2)     Status: None   Collection Time: 04/18/23  7:42 PM   Specimen: BLOOD  Result  Value Ref Range Status   Specimen Description BLOOD BLOOD RIGHT HAND  Final   Special Requests   Final    BOTTLES DRAWN AEROBIC AND ANAEROBIC Blood Culture adequate volume   Culture   Final    NO GROWTH 5 DAYS Performed at [facility], [address] [place], [place] [address]    Report Status 04/23/2023 FINAL  Final  Expectorated Sputum Assessment w Gram Stain, Rflx to Resp Cult     Status: None   Collection Time: 04/18/23  8:32 PM   Specimen: Sputum  Result Value Ref Range Status   Specimen Description SPUTUM  Final   Special Requests NONE  Final   Sputum evaluation   Final    THIS SPECIMEN IS ACCEPTABLE FOR SPUTUM CULTURE Performed at [facility], [address] [place], [place] [address]    Report Status 04/18/2023 FINAL  Final  Culture, Respiratory w Gram Stain     Status: None   Collection Time: 04/18/23  8:32 PM   Specimen: SPU  Result Value Ref Range Status    Specimen Description   Final    SPUTUM Performed at [facility], [address] [place], [place] [address]    Special Requests   Final    NONE Reflexed from [id number] Performed at [facility], [address] [place], [place] [address]    Gram Stain   Final    FEW SQUAMOUS EPITHELIAL CELLS PRESENT FEW WBC PRESENT,BOTH PMN AND MONONUCLEAR FEW YEAST FEW GRAM NEGATIVE RODS FEW GRAM POSITIVE RODS FEW GRAM POSITIVE COCCI IN PAIRS Performed at [facility], [address] [place], [place] [address]    Culture   Final    ABUNDANT PROVIDENCIA RETTGERI ABUNDANT PROTEUS MIRABILIS    Report Status 04/21/2023 FINAL  Final   Organism ID, Bacteria PROVIDENCIA RETTGERI  Final   Organism ID, Bacteria PROTEUS MIRABILIS  Final      Susceptibility   Proteus mirabilis - MIC*    AMPICILLIN <=2 SENSITIVE Sensitive     CEFEPIME <=0.12 SENSITIVE Sensitive     CEFTAZIDIME <=1 SENSITIVE Sensitive     CEFTRIAXONE <=0.25 SENSITIVE Sensitive     CIPROFLOXACIN <=0.25 SENSITIVE Sensitive     GENTAMICIN <=1 SENSITIVE Sensitive     IMIPENEM 2 SENSITIVE Sensitive     TRIMETH/SULFA <=20 SENSITIVE Sensitive     AMPICILLIN/SULBACTAM <=2 SENSITIVE Sensitive     PIP/TAZO <=4 SENSITIVE Sensitive ug/mL    * ABUNDANT PROTEUS MIRABILIS   Providencia rettgeri - MIC*    AMPICILLIN >=32 RESISTANT Resistant     CEFEPIME <=0.12 SENSITIVE Sensitive     CEFTAZIDIME <=1 SENSITIVE Sensitive     CEFTRIAXONE <=0.25 SENSITIVE Sensitive     CIPROFLOXACIN <=0.25 SENSITIVE Sensitive     GENTAMICIN <=1 SENSITIVE Sensitive     IMIPENEM 2 SENSITIVE Sensitive     TRIMETH/SULFA <=20 SENSITIVE Sensitive     AMPICILLIN/SULBACTAM 16 INTERMEDIATE Intermediate     PIP/TAZO <=4 SENSITIVE Sensitive ug/mL    * ABUNDANT PROVIDENCIA RETTGERI  MRSA Next Gen by PCR, Nasal     Status: None   Collection Time: 04/19/23 12:52 AM   Specimen: Nasal Mucosa; Nasal Swab  Result Value Ref Range Status   MRSA by PCR  Next Gen NOT DETECTED NOT DETECTED Final    Comment: (NOTE) The GeneXpert MRSA Assay (FDA approved for NASAL specimens only), is one component of a comprehensive MRSA colonization surveillance program. It is not intended to diagnose MRSA infection  nor to guide or monitor treatment for MRSA infections. Test performance is not FDA approved in patients less than 80 years old. Performed at [facility], [address] [place], [place] [address]   Culture, blood (Routine X 2) w Reflex to ID Panel     Status: None   Collection Time: 04/22/23 12:27 PM   Specimen: BLOOD  Result Value Ref Range Status   Specimen Description BLOOD BLOOD LEFT HAND  Final   Special Requests   Final    BOTTLES DRAWN AEROBIC AND ANAEROBIC Blood Culture adequate volume   Culture   Final    NO GROWTH 5 DAYS Performed at [facility], [address], [place], [place] [address]    Report Status 04/27/2023 FINAL  Final  Culture, blood (Routine X 2) w Reflex to ID Panel     Status: None   Collection Time: 04/22/23 12:37 PM   Specimen: BLOOD  Result Value Ref Range Status   Specimen Description BLOOD BLOOD RIGHT HAND  Final   Special Requests   Final    BOTTLES DRAWN AEROBIC AND ANAEROBIC Blood Culture adequate volume   Culture   Final    NO GROWTH 5 DAYS Performed at [facility], [address] [place], [place] [address]    Report Status 04/27/2023 FINAL  Final  Culture, Respiratory w Gram Stain     Status: None   Collection Time: 04/22/23  1:22 PM   Specimen: Tracheal Aspirate  Result Value Ref Range Status   Specimen Description   Final    TRACHEAL ASPIRATE Performed at [facility], [address], [place], [place] [address]    Special Requests   Final    NONE Performed at [facility], [address] [place], [place] [address]    Gram Stain   Final    ABUNDANT SQUAMOUS EPITHELIAL CELLS PRESENT ABUNDANT WBC PRESENT, PREDOMINANTLY PMN FEW GRAM  NEGATIVE RODS RARE GRAM POSITIVE COCCI IN PAIRS Performed at [facility], [address], [place], [place] [address]    Culture   Final    MODERATE PSEUDOMONAS AERUGINOSA MODERATE PROTEUS MIRABILIS MODERATE ENTEROCOCCUS FAECALIS    Report Status 04/27/2023 FINAL  Final   Organism ID, Bacteria PSEUDOMONAS AERUGINOSA  Final   Organism ID, Bacteria PROTEUS MIRABILIS  Final   Organism ID, Bacteria ENTEROCOCCUS FAECALIS  Final      Susceptibility   Enterococcus faecalis - MIC*    AMPICILLIN <=2 SENSITIVE Sensitive     VANCOMYCIN 2 SENSITIVE Sensitive     GENTAMICIN SYNERGY SENSITIVE Sensitive     * MODERATE ENTEROCOCCUS FAECALIS   Pseudomonas aeruginosa - MIC*    CEFTAZIDIME 4 SENSITIVE Sensitive     CIPROFLOXACIN >=4 RESISTANT Resistant     GENTAMICIN 8 INTERMEDIATE Intermediate     IMIPENEM 2 SENSITIVE Sensitive     PIP/TAZO 16 SENSITIVE Sensitive ug/mL    * MODERATE PSEUDOMONAS AERUGINOSA   Proteus mirabilis - MIC*    AMPICILLIN <=2 SENSITIVE Sensitive     CEFEPIME <=0.12 SENSITIVE Sensitive     CEFTAZIDIME <=1 SENSITIVE Sensitive     CEFTRIAXONE <=0.25 SENSITIVE Sensitive     CIPROFLOXACIN <=0.25 SENSITIVE Sensitive     GENTAMICIN <=1 SENSITIVE Sensitive     IMIPENEM 2 SENSITIVE Sensitive     TRIMETH/SULFA <=20 SENSITIVE Sensitive     AMPICILLIN/SULBACTAM <=2 SENSITIVE Sensitive     PIP/TAZO <=4 SENSITIVE Sensitive ug/mL    * MODERATE PROTEUS MIRABILIS  Resp panel  by RT-PCR (RSV, Flu A&B, Covid) Anterior Nasal Swab     Status: None   Collection Time: 04/22/23  5:53 PM   Specimen: Anterior Nasal Swab  Result Value Ref Range Status   SARS Coronavirus 2 by RT PCR NEGATIVE NEGATIVE Final    Comment: (NOTE) SARS-CoV-2 target nucleic acids are NOT DETECTED.  The SARS-CoV-2 RNA is generally detectable in upper respiratory specimens during the acute phase of infection. The lowest concentration of SARS-CoV-2 viral copies this assay can detect is 138 copies/mL. A negative  result does not preclude SARS-Cov-2 infection and should not be used as the sole basis for treatment or other patient management decisions. A negative result may occur with  improper specimen collection/handling, submission of specimen other than nasopharyngeal swab, presence of viral mutation(s) within the areas targeted by this assay, and inadequate number of viral copies(<138 copies/mL). A negative result must be combined with clinical observations, patient history, and epidemiological information. The expected result is Negative.  Fact Sheet for Patients:  BloggerCourse.com  Fact Sheet for Healthcare Providers:  SeriousBroker.it  This test is no t yet approved or cleared by the [place] FDA and  has been authorized for detection and/or diagnosis of SARS-CoV-2 by FDA under an Emergency Use Authorization (EUA). This EUA will remain  in effect (meaning this test can be used) for the duration of the COVID-19 declaration under Section 564(b)(1) of the Act, 21 U.S.C.section 360bbb-3(b)(1), unless the authorization is terminated  or revoked sooner.       Influenza A by PCR NEGATIVE NEGATIVE Final   Influenza B by PCR NEGATIVE NEGATIVE Final    Comment: (NOTE) The Xpert Xpress SARS-CoV-2/FLU/RSV plus assay is intended as an aid in the diagnosis of influenza from Nasopharyngeal swab specimens and should not be used as a sole basis for treatment. Nasal washings and aspirates are unacceptable for Xpert Xpress SARS-CoV-2/FLU/RSV testing.  Fact Sheet for Patients: BloggerCourse.com  Fact Sheet for Healthcare Providers: SeriousBroker.it  This test is not yet approved or cleared by the [place] FDA and has been authorized for detection and/or diagnosis of SARS-CoV-2 by FDA under an Emergency Use Authorization (EUA). This EUA will remain in effect (meaning this test can be used)  for the duration of the COVID-19 declaration under Section 564(b)(1) of the Act, 21 U.S.C. section 360bbb-3(b)(1), unless the authorization is terminated or revoked.     Resp Syncytial Virus by PCR NEGATIVE NEGATIVE Final    Comment: (NOTE) Fact Sheet for Patients: BloggerCourse.com  Fact Sheet for Healthcare Providers: SeriousBroker.it  This test is not yet approved or cleared by the [place] FDA and has been authorized for detection and/or diagnosis of SARS-CoV-2 by FDA under an Emergency Use Authorization (EUA). This EUA will remain in effect (meaning this test can be used) for the duration of the COVID-19 declaration under Section 564(b)(1) of the Act, 21 U.S.C. section 360bbb-3(b)(1), unless the authorization is terminated or revoked.  Performed at [facility], [address], [place], [place] [address]   Urine Culture (for pregnant, neutropenic or urologic patients or patients with an indwelling urinary catheter)     Status: None   Collection Time: 04/24/23  7:35 PM   Specimen: Urine, Random  Result Value Ref Range Status   Specimen Description   Final    URINE, RANDOM Performed at [facility], [address] [place], [place] [address]    Special Requests   Final    NONE Performed at [facility], [phone]  [address] [place], [place] [address]    Culture   Final    NO GROWTH Performed at [facility], [address] [place], [place] [address]    Report Status 04/25/2023 FINAL  Final  Culture, BAL-quantitative w Gram Stain     Status: Abnormal (Preliminary result)   Collection Time: 04/27/23  3:32 PM   Specimen: Bronchoalveolar Lavage; Respiratory  Result Value Ref Range Status   Specimen Description   Final    BRONCHIAL ALVEOLAR LAVAGE Performed at [facility], [address], [place], [place] [address]    Special Requests   Final    NONE Performed at [facility], [address] [place], [place] [address]    Gram Stain   Final    FEW WBC PRESENT, PREDOMINANTLY PMN FEW GRAM NEGATIVE RODS    Culture (A)  Final    >=100,000 COLONIES/mL GRAM NEGATIVE RODS CULTURE REINCUBATED FOR BETTER GROWTH Performed at [facility], [address] [place], [place] [address]    Report Status PENDING  Incomplete  MRSA Next Gen by PCR, Nasal     Status: None   Collection Time: 04/27/23  4:50 PM   Specimen: Nasal Mucosa; Nasal Swab  Result Value Ref Range Status   MRSA by PCR Next Gen NOT DETECTED NOT DETECTED Final    Comment: (NOTE) The GeneXpert MRSA Assay (FDA approved for NASAL specimens only), is one component of a comprehensive MRSA colonization surveillance program. It is not intended to diagnose MRSA infection nor to guide or monitor treatment for MRSA infections. Test performance is not FDA approved in patients less than 74 years old. Performed at [facility], [address] [place], [place] [address]     Coagulation Studies: No results for input(s): "LABPROT", "INR" in the last 72 hours.  Urinalysis: No results for input(s): "COLORURINE", "LABSPEC", "PHURINE", "GLUCOSEU", "HGBUR", "BILIRUBINUR", "KETONESUR", "PROTEINUR", "UROBILINOGEN", "NITRITE", "LEUKOCYTESUR" in the last 72 hours.  Invalid input(s): "APPERANCEUR"     Imaging: No results found.    Medications:    cefTAZidime (FORTAZ)  IV Stopped (04/29/23 0630)   feeding supplement (NEPRO CARB STEADY) 50 mL/hr at 04/29/23 9528   furosemide (LASIX) 200 mg in dextrose 5 % 100 mL (2 mg/mL) infusion 4 mg/hr (04/29/23 0812)    apixaban  5 mg Per Tube BID   atropine  2 drop Sublingual QID   budesonide (PULMICORT) nebulizer solution  0.25 mg Nebulization BID   carvedilol  6.25 mg Per Tube BID WC   Chlorhexidine Gluconate Cloth  6 each Topical Daily   clonazePAM  0.5 mg Per Tube BID   famotidine  20 mg Per Tube BID   feeding supplement (PROSource TF20)   60 mL Per Tube Daily   free water  100 mL Per Tube Q6H   glycopyrrolate  1 mg Per Tube TID   insulin aspart  0-15 Units Subcutaneous Q4H   insulin glargine-yfgn  13 Units Subcutaneous QHS   ipratropium-albuterol  3 mL Nebulization TID   levETIRAcetam  750 mg Per Tube BID   metoCLOPramide  10 mg Per Tube Q8H   nutrition supplement (JUVEN)  1 packet Per Tube BID BM   mouth rinse  15 mL Mouth Rinse 4 times per day   oxyCODONE  2.5 mg Per Tube Q8H   sodium zirconium cyclosilicate  10 g Per Tube Daily   valproic acid  750 mg Per Tube TID   acetaminophen (TYLENOL) oral liquid 160 mg/5  mL, albuterol, guaiFENesin, iohexol, LORazepam, ondansetron (ZOFRAN) IV, mouth rinse, polyethylene glycol, sodium chloride HYPERTONIC  Assessment/ Plan:  Mr. Billy Cherry is a 53 y.o.  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  Acute kidney injury, secondary to inflammation and infection resulting in ATN.  Imaging-no hydronephrosis noted. Urinalysis-04/24/2023-small hemoglobin 11-20 RBCs, rare WBCs Likely ATN secondary to concurrent illness, pneumonia. Patient is oliguric.   Renal function continues to slowly improve. Adequate urine output with IV furosemide drip. BUN remains elevated, likely due to tube  feeds. No immediate need for dialysis as renal indices and urine output has improved. Dialysis would be challenging and patient's tolerance to sit in a chair for outpatient treatments is concerning, also the burden of transporting patient may also be too great. Will continue to monitor.   Lab Results  Component Value Date   CREATININE 3.38 (H) 04/29/2023   CREATININE 3.54 (H) 04/28/2023   CREATININE 3.62 (H) 04/27/2023    Intake/Output Summary (Last 24 hours) at 04/29/2023 1013 Last data filed at 04/29/2023 0812 Gross per 24 hour  Intake 1302.85 ml  Output 1365 ml  Net -62.15 ml    2.  Hyperkalemia Potassium 4.8, remains on daily Lokelma   LOS: 11 Adyline Huberty 2/24/202510:13 AM

## 2023-04-29 NOTE — Progress Notes (Signed)
 Reason for Consult:Acute on chronic hypoxic respiratory failure Referring Physician: Dr. [name]  Billy Cherry is an 53 y.o. male.  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  Patient has been transferred to the ICU for worsening hypoxemia, prolonged periods of apnea on trach collar, increased copious secretions and persistent fever.  Imaging studies showed bilateral pneumonia.  Patient was started on empiric antibiotics.  With vancomycin and Zosyn and subsequently vancomycin was discontinued the patient was maintained on Zosyn. Patient's kidney function has also been deteriorating since admission.  His baseline creatinine 1.26 and has now increased to 3.59 with a BUN of 138 mg/dL.  His GFR is down to 20.  He remains tachycardic and his SpO2 is fluctuating between 93 and 98% on 10 L of oxygen via trach collar.  04/29/23- Patient requires 70%HHFNC.  Reviewed Rx with pharmacist and refined pneumonia regimen. He has below +micro fromBAL.  Now on Zosyn IV.      2 d ago   Specimen Description BRONCHIAL ALVEOLAR LAVAGE Performed at [facility], [address] [place], [place] [address]  Special Requests NONE Performed at [facility], [address] [place], [place] [address]  Gram Stain FEW WBC PRESENT, PREDOMINANTLY PMN FEW GRAM NEGATIVE RODS  Culture  Abnormal  >=100,000 COLONIES/mL PSEUDOMONAS AERUGINOSA 30,000 COLONIES/mL ENTEROCOCCUS FAECIUM SUSCEPTIBILITIES TO FOLLOW    Past Medical History:  Diagnosis Date   Anoxic brain injury ([name])    Hypertension     Past Surgical History:  Procedure Laterality Date   BACK SURGERY     CORONARY/GRAFT ACUTE MI REVASCULARIZATION  N/A 03/23/2021   Procedure: Coronary/Graft Acute MI Revascularization;  Surgeon: Marcina Millard, MD;  Location: [facility] INVASIVE CV LAB;  Service: Cardiovascular;  Laterality: N/A;   IR GASTROSTOMY TUBE MOD SED  04/26/2021   LEFT HEART CATH AND CORONARY ANGIOGRAPHY N/A 03/23/2021   Procedure: LEFT HEART CATH AND CORONARY ANGIOGRAPHY;  Surgeon: Marcina Millard, MD;  Location: [facility] INVASIVE CV LAB;  Service: Cardiovascular;  Laterality: N/A;   SHOULDER SURGERY      No family history on file.  Social History:  reports that he has never smoked. He has never used smokeless tobacco. He reports current alcohol use. He reports that he does not use drugs.  Allergies:  Allergies  Allergen Reactions   Lisinopril Swelling    angioedema  Other Reaction(s): angioedema     Current Facility-Administered Medications:    acetaminophen (TYLENOL) 160 MG/5ML solution 650 mg, 650 mg, Per Tube, Q6H PRN, Lorretta Harp, MD, 650 mg at 04/24/23 0428   albuterol (PROVENTIL) (2.5 MG/3ML) 0.083% nebulizer solution 2.5 mg, 2.5 mg, Nebulization, Q4H PRN, Lorretta Harp, MD   apixaban Everlene Balls) tablet 5 mg, 5 mg, Per Tube, BID, Lorretta Harp, MD, 5 mg at 04/28/23 2015   atropine 1 % ophthalmic solution 2 drop, 2 drop, Sublingual, QID, Lorretta Harp, MD, 2 drop at 04/29/23 0136   budesonide (PULMICORT) nebulizer solution 0.25 mg, 0.25 mg, Nebulization, BID, Wieting, Richard, MD, 0.25 mg at 04/29/23 0757   carvedilol (COREG) tablet 6.25 mg, 6.25 mg, Per Tube, BID WC, Wieting, Richard, MD, 6.25 mg at 04/28/23 1939   cefTAZidime (FORTAZ) 2 g in sodium chloride 0.9 % 100 mL IVPB, 2 g, Intravenous,  Billy Fischer, [place], Stopped at 04/29/23 0630   Chlorhexidine Gluconate Cloth 2 % PADS 6 each, 6 each, Topical, Daily, Erin Fulling, MD, 6 each at 04/28/23 0946   clonazePAM (KLONOPIN) tablet 0.5 mg, 0.5 mg, Per Tube, BID, Lorretta Harp, MD, 0.5 mg at 04/28/23 2015   famotidine (PEPCID) tablet 20 mg, 20 mg, Per Tube, BID, Lorretta Harp, MD, 20 mg at 04/28/23 2015   feeding supplement (NEPRO CARB STEADY) liquid 1,000 mL, 1,000 mL, Per Tube, Continuous, [name], MD, Last Rate: 50 mL/hr at 04/29/23 0812, Infusion Verify at 04/29/23 0812   feeding supplement (PROSource TF20) liquid 60 mL, 60 mL, Per Tube, Daily, [name], MD, 60 mL at 04/28/23 0946   free water 200 mL, 200 mL, Per Tube, Q6H, Lorretta Harp, MD, 200 mL at 04/29/23 0534   furosemide (LASIX) 200 mg in dextrose 5 % 100 mL (2 mg/mL) infusion, 4 mg/hr, Intravenous, Continuous, [name], MD, Last Rate: 2 mL/hr at 04/29/23 0812, 4 mg/hr at 04/29/23 4098   glycopyrrolate (ROBINUL) tablet 1 mg, 1 mg, Per Tube, TID, Lorretta Harp, MD, 1 mg at 04/28/23 2009   guaiFENesin (ROBITUSSIN) 100 MG/5ML liquid 200 mg, 200 mg, Per Tube, Q4H PRN, Lorretta Harp, MD, 200 mg at 04/23/23 1638   insulin aspart (novoLOG) injection 0-15 Units, 0-15 Units, Subcutaneous, Q4H, [name], MD, 8 Units at 04/29/23 0320   insulin glargine-yfgn (SEMGLEE) injection 13 Units, 13 Units, Subcutaneous, QHS, [name], MD, 13 Units at 04/28/23 2010   iohexol (OMNIPAQUE) 300 MG/ML solution 30 mL, 30 mL, Oral, Once PRN, [name], MD   ipratropium-albuterol (DUONEB) 0.5-2.5 (3) MG/3ML nebulizer solution 3 mL, 3 mL, Nebulization, TID, Renae Gloss, Richard, MD, 3 mL at 04/29/23 0757   levETIRAcetam (KEPPRA) 100 MG/ML solution 750 mg, 750 mg, Per Tube, BID, Alford [name], MD, 750 mg at 04/28/23 2009   LORazepam (ATIVAN) injection 2 mg, 2 mg, Intravenous, Q2H PRN, Lorretta Harp, MD   metoCLOPramide (REGLAN) 10 MG/10ML solution 10 mg, 10 mg, Per Tube, Q8H, Lorretta Harp, MD, 10 mg at 04/29/23 0607   nutrition supplement (JUVEN) (JUVEN) powder packet 1 packet, 1 packet, Per Tube, BID BM, [name], MD, 1 packet at 04/28/23 1344   ondansetron (ZOFRAN) injection 4 mg, 4 mg, Intravenous, Q8H PRN, Lorretta Harp, MD   Oral care mouth rinse, 15 mL, Mouth Rinse, 4 times per day, [name], MD, 15 mL at  04/28/23 2011   Oral care mouth rinse, 15 mL, Mouth Rinse, PRN, [name], MD, 15 mL at 04/28/23 1737   oxyCODONE (ROXICODONE) 5 MG/5ML solution 2.5 mg, 2.5 mg, Per Tube, Q8H, Wieting, Richard, MD, 2.5 mg at 04/29/23 0133   polyethylene glycol (MIRALAX / GLYCOLAX) packet 17 g, 17 g, Per Tube, Daily PRN, [name], MD   sodium chloride HYPERTONIC 3 % nebulizer solution 4 mL, 4 mL, Nebulization, PRN, Manuela Schwartz, NP   sodium zirconium cyclosilicate (LOKELMA) packet 10 g, 10 g, Per Tube, Daily, [name], MD, 10 g at 04/28/23 0946   valproic acid (DEPAKENE) 250 MG/5ML solution 750 mg, 750 mg, Per Tube, TID, Lorretta Harp, MD, 750 mg at 04/28/23 2011   Results for orders placed or performed during the hospital encounter of 04/18/23 (from the past 48 hours)  Glucose, capillary     Status: Abnormal   Collection Time: 04/27/23  8:47 AM  Result Value Ref Range   Glucose-Capillary 178 (H) 70 - 99 mg/dL    Comment: Glucose reference  range applies only to samples taken after fasting for at least 8 hours.  Glucose, capillary     Status: Abnormal   Collection Time: 04/27/23  1:02 PM  Result Value Ref Range   Glucose-Capillary 221 (H) 70 - 99 mg/dL    Comment: Glucose reference range applies only to samples taken after fasting for at least 8 hours.  Lactic acid, plasma     Status: None   Collection Time: 04/27/23  3:00 PM  Result Value Ref Range   Lactic Acid, Venous 1.5 0.5 - 1.9 mmol/L    Comment: Performed at [facility], [address] [place], [place] [address]  Blood gas, venous     Status: Abnormal   Collection Time: 04/27/23  3:07 PM  Result Value Ref Range   pH, Ven 7.39 7.25 - 7.43   pCO2, Ven 55 44 - 60 mmHg   pO2, Ven 67 (H) 32 - 45 mmHg   Bicarbonate 33.3 (H) 20.0 - 28.0 mmol/L   Acid-Base Excess 6.7 (H) 0.0 - 2.0 mmol/L   O2 Saturation 94.9 %   Patient temperature 37.0    Collection site VEIN     Comment: Performed at [facility], [address] [place], [place] [address]  Culture, BAL-quantitative w Gram Stain     Status: Abnormal (Preliminary result)   Collection Time: 04/27/23  3:32 PM   Specimen: Bronchoalveolar Lavage; Respiratory  Result Value Ref Range   Specimen Description      BRONCHIAL ALVEOLAR LAVAGE Performed at [facility], [address] [place], [place] [address]    Special Requests      NONE Performed at [facility], [address] [place], [place] [address]    Gram Stain      FEW WBC PRESENT, PREDOMINANTLY PMN FEW GRAM NEGATIVE RODS    Culture (A)     >=100,000 COLONIES/mL GRAM NEGATIVE RODS CULTURE REINCUBATED FOR BETTER GROWTH Performed at [facility], [address], [place], [place] [address]    Report Status PENDING   Glucose, capillary     Status: Abnormal   Collection Time: 04/27/23  3:46 PM  Result Value Ref Range   Glucose-Capillary 186 (H) 70 - 99 mg/dL    Comment: Glucose reference range applies only to samples taken after fasting for at least 8 hours.  MRSA Next Gen by PCR, Nasal     Status: None   Collection Time: 04/27/23  4:50 PM   Specimen: Nasal Mucosa; Nasal Swab  Result Value Ref Range   MRSA by PCR Next Gen NOT DETECTED NOT DETECTED    Comment: (NOTE) The GeneXpert MRSA Assay (FDA approved for NASAL specimens only), is one component of a comprehensive MRSA colonization surveillance program. It is not intended to diagnose MRSA infection nor to guide or monitor treatment for MRSA infections. Test performance is not FDA approved in patients less than 58 years old. Performed at [facility], [address] [place], [place] [address]   Basic metabolic panel     Status: Abnormal   Collection Time: 04/27/23  5:03 PM  Result Value Ref Range   Sodium 142 135 - 145 mmol/L   Potassium 5.0 3.5 - 5.1 mmol/L   Chloride 99 98 - 111 mmol/L   CO2 27 22 - 32 mmol/L   Glucose, Bld 216 (H) 70 - 99 mg/dL    Comment: Glucose reference range  applies only to samples taken after fasting for at least 8  hours.   BUN 144 (H) 6 - 20 mg/dL    Comment: RESULT CONFIRMED BY MANUAL DILUTION [facility]   Creatinine, Ser 3.62 (H) 0.61 - 1.24 mg/dL   Calcium 8.5 (L) 8.9 - 10.3 mg/dL   GFR, Estimated 19 (L) >60 mL/min    Comment: (NOTE) Calculated using the CKD-EPI Creatinine Equation (2021)    Anion gap 16 (H) 5 - 15    Comment: Performed at [facility], [address] [place], [place] [address]  Lactic acid, plasma     Status: None   Collection Time: 04/27/23  5:03 PM  Result Value Ref Range   Lactic Acid, Venous 1.4 0.5 - 1.9 mmol/L    Comment: Performed at [facility], [address] [place], [place] [address]  Glucose, capillary     Status: Abnormal   Collection Time: 04/27/23  7:37 PM  Result Value Ref Range   Glucose-Capillary 196 (H) 70 - 99 mg/dL    Comment: Glucose reference range applies only to samples taken after fasting for at least 8 hours.  Glucose, capillary     Status: Abnormal   Collection Time: 04/27/23 11:48 PM  Result Value Ref Range   Glucose-Capillary 251 (H) 70 - 99 mg/dL    Comment: Glucose reference range applies only to samples taken after fasting for at least 8 hours.  Glucose, capillary     Status: Abnormal   Collection Time: 04/28/23  3:52 AM  Result Value Ref Range   Glucose-Capillary 205 (H) 70 - 99 mg/dL    Comment: Glucose reference range applies only to samples taken after fasting for at least 8 hours.  Basic metabolic panel     Status: Abnormal   Collection Time: 04/28/23  4:36 AM  Result Value Ref Range   Sodium 141 135 - 145 mmol/L   Potassium 5.1 3.5 - 5.1 mmol/L   Chloride 99 98 - 111 mmol/L   CO2 31 22 - 32 mmol/L   Glucose, Bld 225 (H) 70 - 99 mg/dL    Comment: Glucose reference range applies only to samples taken after fasting for at least 8 hours.   BUN 147 (H) 6 - 20 mg/dL    Comment: RESULT CONFIRMED BY MANUAL DILUTION.PMF   Creatinine, Ser 3.54 (H) 0.61 - 1.24  mg/dL   Calcium 8.7 (L) 8.9 - 10.3 mg/dL   GFR, Estimated 20 (L) >60 mL/min    Comment: (NOTE) Calculated using the CKD-EPI Creatinine Equation (2021)    Anion gap 11 5 - 15    Comment: Performed at [facility], [address] [place], [place] [address]  CBC     Status: Abnormal   Collection Time: 04/28/23  4:36 AM  Result Value Ref Range   WBC 14.8 (H) 4.0 - 10.5 K/uL   RBC 2.40 (L) 4.22 - 5.81 MIL/uL   Hemoglobin 8.0 (L) 13.0 - 17.0 g/dL   HCT 36.6 (L) 44.0 - 34.7 %   MCV 110.8 (H) 80.0 - 100.0 fL   MCH 33.3 26.0 - 34.0 pg   MCHC 30.1 30.0 - 36.0 g/dL   RDW 42.5 95.6 - 38.7 %   Platelets 185 150 - 400 K/uL   nRBC 0.3 (H) 0.0 - 0.2 %    Comment: Performed at [facility], [address] [place], [place] [address]  Glucose, capillary     Status: Abnormal   Collection Time: 04/28/23  7:25 AM  Result Value Ref Range   Glucose-Capillary 228 (H)  70 - 99 mg/dL    Comment: Glucose reference range applies only to samples taken after fasting for at least 8 hours.  Glucose, capillary     Status: Abnormal   Collection Time: 04/28/23 11:18 AM  Result Value Ref Range   Glucose-Capillary 259 (H) 70 - 99 mg/dL    Comment: Glucose reference range applies only to samples taken after fasting for at least 8 hours.  Glucose, capillary     Status: Abnormal   Collection Time: 04/28/23  3:45 PM  Result Value Ref Range   Glucose-Capillary 269 (H) 70 - 99 mg/dL    Comment: Glucose reference range applies only to samples taken after fasting for at least 8 hours.  Glucose, capillary     Status: Abnormal   Collection Time: 04/28/23  7:29 PM  Result Value Ref Range   Glucose-Capillary 315 (H) 70 - 99 mg/dL    Comment: Glucose reference range applies only to samples taken after fasting for at least 8 hours.  Glucose, capillary     Status: Abnormal   Collection Time: 04/28/23 11:57 PM  Result Value Ref Range   Glucose-Capillary 305 (H) 70 - 99 mg/dL    Comment: Glucose  reference range applies only to samples taken after fasting for at least 8 hours.  Glucose, capillary     Status: Abnormal   Collection Time: 04/29/23  3:18 AM  Result Value Ref Range   Glucose-Capillary 262 (H) 70 - 99 mg/dL    Comment: Glucose reference range applies only to samples taken after fasting for at least 8 hours.  Glucose, capillary     Status: Abnormal   Collection Time: 04/29/23  7:43 AM  Result Value Ref Range   Glucose-Capillary 248 (H) 70 - 99 mg/dL    Comment: Glucose reference range applies only to samples taken after fasting for at least 8 hours.    No results found.   REVIEW OF SYSTEMS: ROS unable to obtain due to critical ill status   Blood pressure 130/79, pulse (!) 112, temperature 97.9 F (36.6 C), temperature source Axillary, resp. rate (!) 27, height 5\' 11"  (1.803 m), weight 119.7 kg, SpO2 94%. Physical exam Constitutional: Alert but disoriented. A appears chronically ill  HEENT: Normocephalic and atraumatic,  EOM are normal,  Pupils are equal, round, and reactive to light, normal range of motion and neck is supple; trachea in midline with tracheostomy tube in place, copious secretions draining from trach tube Cardiovascular: Apical pulse is regular, S1-S2, no murmurs regurg or gallops.   Pulmonary/Chest: Decreased chest movement, bilateral lung sounds with profound rhonchi and crackles. Abdominal: Distended, firm on palpation, hypoactive bowel sounds.  PEG tube insertion site clean with intact dressing Musculoskeletal: Contractures and muscle wasting in bilateral lower extremities; bed confined Neurological: quadriplegic, unable to move lower and upper extremities, nonverbal skin: Skin is warm and dry. 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 Multifocal pneumonia versus  aspiration pneumonitis-sputum culture positive for multiple organisms-Enterococcus faecalis, Pseudomonas, aeruginosa and Proteus mirabilis Mucous plug-resolved after bedside bronchoscopy Plan -Bedside bronchoscopy completed and BAL specimen obtained -Mucous plug resolved with bronchoscopy - Follow-up BAL cultures -continue Zosyn -Start ceftazidime per pharmacy dosing - Monitor fever curve -Supplemental oxygen via trach collar and titrate to maintain SpO2 above 90% -Aggressive pulmonary toileting -ABGs and chest x-ray as needed -As needed bronchodilator because -I- Nephrology Acute on  chronic renal failure-creatinine has trended up from less than 2 upon admission to 3.5 currently. Plan -Nephrology already consulted and following - Continue to trend creatinine and monitor urine output - Avoid nephrotoxic drugs  Rest of the treatment plan per hospitalist team. Best Practice: Diet/type: N.p.o./PEG tube VTE prophylaxis:  SCD's /patient is already on apixaban GI prophylaxis: Famotidine Lines: Tracheostomy tube, gastrostomy tube, and peripheral IVs Foley: Yes and still needed for close monitoring due to acute renal failure Code Status: Full code Last date of multidisciplinary goals of care discussion 04/27/2023 at bedside with patient's wife   CRITICAL CARE  Critical care provider statement:   Total critical care time: 33 minutes   Performed by: Karna Christmas MD   Critical care time was exclusive of separately billable procedures and treating other patients.   Critical care was necessary to treat or prevent imminent or life-threatening deterioration.   Critical care was time spent personally by me on the following activities: development of treatment plan with patient and/or surrogate as well as nursing, discussions with consultants, evaluation of patient's response to treatment, examination of patient, obtaining history from patient or surrogate, ordering and performing treatments  and interventions, ordering and review of laboratory studies, ordering and review of radiographic studies, pulse oximetry and re-evaluation of patient's condition.    Vida Rigger, M.D.  Pulmonary & Critical Care Medicine

## 2023-04-29 NOTE — Progress Notes (Signed)
 Progress Note   Patient: Billy Cherry ZOX:[id number] DOB: 08-05-1970 DOA: 04/18/2023     11 DOS: the patient was seen and examined on 04/29/2023   Brief hospital course: 53 y.o. 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   Patient is nonverbal.  Per her wife at the bedside, patient has increased shortness of breath with increased tracheal secretion in the past several days.  No fever or chills.  Patient has increased fluid retention, particularly in arms per her wife.  Patient does not see to have chest pain, nausea, vomiting, abdominal pain per his wife.  Patient was found to have oxygen desaturation to 88% on room air, which improved to 99% of 4L -6L oxygen in ED.    Data reviewed independently and ED Course: pt was found to have BNP 349, potassium 5.8, GFR> 60, WBC 17.1, negative PCR for flu, COVID and RSV, troponin 13 --> 15, procalcitonin 0.17.  Chest x-ray showed bilateral basilar opacity (the left is worse than the right).  Temperature 99.1, blood pressure 125/100, heart rate of 108, RR 22.  Patient is admitted to PCU as inpatient.  2/14.  Patient on 40% trach collar.  Patient on IV Lasix to remove fluid and IV antibiotics for pneumonia. 2/15.  Creatinine went up to 1.6 will decrease Lasix down to 20 mg IV twice daily.  Switch tube feedings over to Glucerna that he takes at home. Dietitian to adjust rate to goal and free water. 2/16.  Creatinine up at 1.66.  Will hold Lasix for the rest of today and recheck creatinine tomorrow.  Try to get patient down to his baseline oxygen.  Echocardiogram shows an EF of 30 to 35% stable from previous. 2/17.  Creatinine up at 1.85.  Patient spiked a fever of 102.  Will get blood cultures and change antibiotics to Zithromax and Zosyn.   Chest x-ray showing left lower lobe pneumonia.  Repeat COVID RSV and influenza negative. 2/18.  Creatinine up at 2.22.  Maintenance fluids started.  Added vancomycin. 2/19: Creatinine continued to get worse, currently at 2.91 with BUN of 96, IV Lasix has been discontinued and he was getting some IV fluid.  No significant volume on bladder scan.  Continue to have fever, maximum recorded at 101.8 over the past 24-hour.  MRSA PCR was negative so vancomycin is being discontinued.  Blood cultures from 12/17 remain negative.  Repeat sputum cultures pending consulting infectious disease, CT chest, abdomen and pelvis, UA with urine culture was ordered. EEG with severe diffuse encephalopathy, no seizure or definitive epileptiform discharges were noted. 2/20: Afebrile this morning but maximum temperature recorded was 100.6 over the past 24-hour.  Worsening renal function, UA with protein urea and rare bacteria, urine cultures pending, CT chest, abdomen and pelvis with cardiomegaly, small bilateral pleural effusion and mild septal thickening, bilateral perihilar groundglass density suggestive of some pulmonary edema.  Also has a confluent consolidation in the left lower lobe and partial right lower lobe suspicious for pneumonia or aspiration. No evidence of any acute intra-abdominal abnormality. Stopping gentle IV fluid and ordered 1 dose of IV Lasix 40 mg Renal ultrasound with some renal echogenicity, no hydronephrosis. Also consulted nephrology 2/21: Remained afebrile, worsening hyperkalemia with potassium at 5.6, worsening renal function with BUN 126 and creatinine 3.48, urine cultures negative, repeat sputum cultures with  Pseudomonas and Proteus-continuing Zosyn.  Ordered some Lokelma, nephrology is recommending switching Glucerna with Nepro due to high potassium content.  Need to monitor potassium very closely, patient might require dialysis if renal function and potassium continue to get worse.  Patient with  significant life limiting comorbidities, seems like multiorgan dysfunction, palliative care was again offered but wife declined.  2/22: Patient with concern of worsening hypoxia requiring higher level of FiO2 and was placed on 10 L of oxygen.  Intermittently having apneic episodes.  Repeat chest x-ray again with concern of pulmonary vascular congestion, received 1 dose of IV Lasix 80 mg, renal function seems plateaued now.  After discussing with nephrology patient was started on Lasix infusion. Also discussed with PCCM and they want him to transfer to ICU for close monitoring as he is high risk to go on a ventilator. Ordered ABG and repeating lactic acid  2/23: Patient underwent bronchoscopy with removal of mucous plugging with pulmonary yesterday.  BAL was sent for culture, remained on 12 L of oxygen with 50% FiO2. Renal function stable with GFR of 20, worsening BUN at 146.  UOP of 1890 recorded over the past 24-hour, patient still quite edematous and on Lasix infusion.  Patient also had diarrhea yesterday, Foley catheter and rectal tube was placed in ICU  2/24: Worsening hypoxia, now placed on heated high flow.  BAL cultures growing Enterococcus faecium and Pseudomonas, patient is on Zosyn PCCM is also on board.  Renal function seems plateaued with worsening BUN which can be due to tube feed.   Assessment and Plan: * Multifocal pneumonia   Initial sputum culture grew Proteus and providencia, which were treated appropriately. Patient had a fever of 102 on 2/17.  Antibiotics switched to Zosyn, Zithromax and added vancomycin back.  Blood cultures so far negative.  Repeat COVID influenza and RSV negative.  Sputum culture growing Pseudomonas and Proteus -As he continued to have fever spikes-ID was consulted -Repeat blood and urine cultures negative,  CT chest abdomen and pelvis with concern of bilateral pneumonia. -Vancomycin was discontinued as MRSA PCR was negative -Continue with Zosyn  -S/p  bronchoscopy with removal of mucous plugging and BAL  culture with Pseudomonas and Enterococcus faecium  Acute on chronic systolic CHF (congestive heart failure) ([name]) Gentle fluids started with worsening kidney function.  Patient on tube feedings.  Continue Coreg.  Repeat echo showed a stable EF of 30 to 35%.  Weight on 2/13 was 263 pounds and not sure how accurate that weight is.  Weight on 2/16 was 232.37.  Today's weight 248.46.  Continue to monitor. pulmonary edema on CT chest,  -Currently on Lasix infusion.  Acute kidney injury superimposed on CKD ([name]) AKI on CKD stage II.  Creatinine now at least plateaued, currently at 3.30 .CT chest with concern of some pulmonary edema.  No significant urinary retention and renal ultrasound without any hydronephrosis.  Good UOP Nephrology consult -Patient was started on Lasix infusion for gentle diuresis -Monitor renal function -Strict intake and output -Avoid nephrotoxins  HCAP (healthcare-associated pneumonia)-resolved as of 04/19/2023 Patient started on Maxipime and Flagyl.  Vancomycin was discontinued with MRSA PE CR being negative  Acute on chronic hypoxic respiratory failure ([name]) Patient with slowly worsening hypoxia and now started getting apneic spells.  No significant secretions on suctioning.   S/p bronchoscopy with removal of mucous plugging, BAL was sent for cultures-pending. Started on heated high flow with FiO2 of 70% -Further respiratory management per ICU   Hyperkalemia Improved with potassium  of 4.8 today -Continue Lokelma -Switched dietary formula from Glucerna to Nepro -Continue to monitor  Seizure disorder ([name]) Continue Keppra and Depakote.  Will get an EEG.  Essential hypertension Continue Coreg  Pulmonary embolus ([name]) On Eliquis  DVT (deep venous thrombosis) ([name]) On Eliquis  S/P percutaneous endoscopic gastrostomy (PEG) tube placement ([name]) Glucerna was switched with Nepro due to worsening renal function  and hyperkalemia. 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  Anoxic encephalopathy ([name]) Supportive care      Subjective: Physical condition remained the same.  Wife at bedside stating that he seems a little less responsive today.  Physical Exam: Vitals:   04/29/23 1200 04/29/23 1300 04/29/23 1400 04/29/23 1519  BP: 119/81 120/81 121/84   Pulse: (!) 106 (!) 104 (!) 104 (!) 106  Resp: 15 13 14 15   Temp: 97.7 F (36.5 C)     TempSrc: Axillary     SpO2: 96% 95% 92% 92%  Weight:      Height:       General.  Minimally responsive obese gentleman, in no acute distress. Pulmonary.  Lungs clear bilaterally, normal respiratory effort. CV.  Regular rate and rhythm, no JVD, rub or murmur. Abdomen.  Soft, nontender, nondistended, BS positive. CNS.  Minimally responsive to painful stimuli, just open eyes momentarily Extremities.  Upper extremity edema  Data Reviewed: Prior data reviewed  Family Communication: Discussed with wife at bedside  Disposition: Status is: Inpatient Remains inpatient appropriate because: Severity of illness  Planned Discharge Destination: Home with Home Health  DVT prophylaxis.  Eliquis Time spent: 50 minutes  This record has been created using Conservation officer, historic buildings. Errors have been sought and corrected,but may not always be located. Such creation errors do not reflect on the standard of care.   Author: [name], MD 04/29/2023 4:36 PM  For on call review [email or web address].

## 2023-04-29 NOTE — Progress Notes (Signed)
 RT called to bedside d/t pt desaturation <87%. No change in saturation after suctioning. Dr Nelson Chimes updated on pt condition. Pt placed on HHFNC by RT, with improved oxygenation.

## 2023-04-30 ENCOUNTER — Inpatient Hospital Stay: Payer: Medicaid Other

## 2023-04-30 DIAGNOSIS — I509 Heart failure, unspecified: Secondary | ICD-10-CM | POA: Diagnosis not present

## 2023-04-30 DIAGNOSIS — J189 Pneumonia, unspecified organism: Secondary | ICD-10-CM | POA: Diagnosis not present

## 2023-04-30 DIAGNOSIS — E875 Hyperkalemia: Secondary | ICD-10-CM | POA: Diagnosis not present

## 2023-04-30 DIAGNOSIS — I469 Cardiac arrest, cause unspecified: Secondary | ICD-10-CM | POA: Diagnosis not present

## 2023-04-30 DIAGNOSIS — A419 Sepsis, unspecified organism: Secondary | ICD-10-CM | POA: Diagnosis not present

## 2023-04-30 LAB — LACTIC ACID, PLASMA
Lactic Acid, Venous: 4.3 mmol/L (ref 0.5–1.9)
Lactic Acid, Venous: 2.9 mmol/L (ref 0.5–1.9)

## 2023-04-30 LAB — BLOOD GAS, ARTERIAL
Acid-Base Excess: 11.2 mmol/L — ABNORMAL HIGH (ref 0.0–2.0)
Bicarbonate: 37 mmol/L — ABNORMAL HIGH (ref 20.0–28.0)
FIO2: 100 %
MECHVT: 480 mL
Mechanical Rate: 22
O2 Saturation: 99.4 %
PEEP: 8 cmH2O
Patient temperature: 37
pCO2 arterial: 52 mm[Hg] — ABNORMAL HIGH (ref 32–48)
pH, Arterial: 7.46 — ABNORMAL HIGH (ref 7.35–7.45)
pO2, Arterial: 184 mm[Hg] — ABNORMAL HIGH (ref 83–108)

## 2023-04-30 LAB — CULTURE, BAL-QUANTITATIVE W GRAM STAIN

## 2023-04-30 LAB — BASIC METABOLIC PANEL
Anion gap: 14 (ref 5–15)
Anion gap: 13 (ref 5–15)
BUN: 185 mg/dL — ABNORMAL HIGH (ref 6–20)
BUN: 195 mg/dL — ABNORMAL HIGH (ref 6–20)
CO2: 33 mmol/L — ABNORMAL HIGH (ref 22–32)
CO2: 36 mmol/L — ABNORMAL HIGH (ref 22–32)
Calcium: 8.9 mg/dL (ref 8.9–10.3)
Calcium: 8.7 mg/dL — ABNORMAL LOW (ref 8.9–10.3)
Chloride: 98 mmol/L (ref 98–111)
Chloride: 97 mmol/L — ABNORMAL LOW (ref 98–111)
Creatinine, Ser: 3.48 mg/dL — ABNORMAL HIGH (ref 0.61–1.24)
Creatinine, Ser: 3.62 mg/dL — ABNORMAL HIGH (ref 0.61–1.24)
GFR, Estimated: 20 mL/min — ABNORMAL LOW (ref >60)
GFR, Estimated: 19 mL/min — ABNORMAL LOW (ref >60)
Glucose, Bld: 298 mg/dL — ABNORMAL HIGH (ref 70–99)
Glucose, Bld: 400 mg/dL — ABNORMAL HIGH (ref 70–99)
Potassium: 4.4 mmol/L (ref 3.5–5.1)
Potassium: 4.4 mmol/L (ref 3.5–5.1)
Sodium: 145 mmol/L (ref 135–145)
Sodium: 146 mmol/L — ABNORMAL HIGH (ref 135–145)

## 2023-04-30 LAB — CBC
HCT: 24.9 % — ABNORMAL LOW (ref 39.0–52.0)
Hemoglobin: 7.4 g/dL — ABNORMAL LOW (ref 13.0–17.0)
MCH: 33.2 pg (ref 26.0–34.0)
MCHC: 29.7 g/dL — ABNORMAL LOW (ref 30.0–36.0)
MCV: 111.7 fL — ABNORMAL HIGH (ref 80.0–100.0)
Platelets: 202 10*3/uL (ref 150–400)
RBC: 2.23 MIL/uL — ABNORMAL LOW (ref 4.22–5.81)
RDW: 14.6 % (ref 11.5–15.5)
WBC: 10.4 10*3/uL (ref 4.0–10.5)
nRBC: 3.7 % — ABNORMAL HIGH (ref 0.0–0.2)

## 2023-04-30 LAB — GLUCOSE, CAPILLARY
Glucose-Capillary: 259 mg/dL — ABNORMAL HIGH (ref 70–99)
Glucose-Capillary: 264 mg/dL — ABNORMAL HIGH (ref 70–99)
Glucose-Capillary: 295 mg/dL — ABNORMAL HIGH (ref 70–99)
Glucose-Capillary: 353 mg/dL — ABNORMAL HIGH (ref 70–99)
Glucose-Capillary: 321 mg/dL — ABNORMAL HIGH (ref 70–99)
Glucose-Capillary: 355 mg/dL — ABNORMAL HIGH (ref 70–99)
Glucose-Capillary: 334 mg/dL — ABNORMAL HIGH (ref 70–99)
Glucose-Capillary: 363 mg/dL — ABNORMAL HIGH (ref 70–99)

## 2023-04-30 LAB — PHOSPHORUS
Phosphorus: 5.9 mg/dL — ABNORMAL HIGH (ref 2.5–4.6)
Phosphorus: 6 mg/dL — ABNORMAL HIGH (ref 2.5–4.6)

## 2023-04-30 LAB — MAGNESIUM
Magnesium: 3.7 mg/dL — ABNORMAL HIGH (ref 1.7–2.4)
Magnesium: 4.8 mg/dL — ABNORMAL HIGH (ref 1.7–2.4)

## 2023-04-30 LAB — TROPONIN I (HIGH SENSITIVITY): Troponin I (High Sensitivity): 88 ng/L — ABNORMAL HIGH (ref <18)

## 2023-04-30 MED ORDER — PHENYLEPHRINE HCL-NACL 20-0.9 MG/250ML-% IV SOLN
0.0000 ug/min | INTRAVENOUS | Status: DC
  Administered 2023-04-30: 20 ug/min via INTRAVENOUS
  Filled 2023-04-30: qty 250

## 2023-04-30 MED ORDER — AMIODARONE HCL IN DEXTROSE 360-4.14 MG/200ML-% IV SOLN
INTRAVENOUS | Status: AC
  Administered 2023-04-30: 60 mg/h via INTRAVENOUS
  Filled 2023-04-30: qty 200

## 2023-04-30 MED ORDER — AMIODARONE IV BOLUS ONLY 150 MG/100ML
INTRAVENOUS | Status: AC
  Administered 2023-04-30: 150 mg
  Filled 2023-04-30: qty 100

## 2023-04-30 MED ORDER — MIDAZOLAM HCL 2 MG/2ML IJ SOLN
INTRAMUSCULAR | Status: AC
  Filled 2023-04-30: qty 2

## 2023-04-30 MED ORDER — INSULIN ASPART 100 UNIT/ML IJ SOLN
7.0000 [IU] | INTRAMUSCULAR | Status: DC
  Administered 2023-04-30 (×2): 7 [IU] via SUBCUTANEOUS
  Filled 2023-04-30: qty 1

## 2023-04-30 MED ORDER — INSULIN GLARGINE-YFGN 100 UNIT/ML ~~LOC~~ SOLN
20.0000 [IU] | Freq: Every day | SUBCUTANEOUS | Status: DC
  Administered 2023-04-30: 20 [IU] via SUBCUTANEOUS
  Filled 2023-04-30: qty 0.2

## 2023-04-30 MED ORDER — INSULIN ASPART 100 UNIT/ML IJ SOLN: 10.0000 [IU] | INTRAMUSCULAR | Status: DC

## 2023-04-30 MED ORDER — AMIODARONE HCL IN DEXTROSE 360-4.14 MG/200ML-% IV SOLN
60.0000 mg/h | INTRAVENOUS | Status: DC
  Administered 2023-05-01 (×2): 60 mg/h via INTRAVENOUS
  Filled 2023-04-30: qty 200

## 2023-04-30 MED ORDER — DEXTROSE 50 % IV SOLN: 0.0000 mL | INTRAVENOUS | Status: DC | PRN

## 2023-04-30 MED ORDER — INSULIN REGULAR(HUMAN) IN NACL 100-0.9 UT/100ML-% IV SOLN
INTRAVENOUS | Status: DC
  Administered 2023-04-30: 11.5 [IU]/h via INTRAVENOUS
  Administered 2023-05-01: 24 [IU]/h via INTRAVENOUS
  Administered 2023-05-01: 17 [IU]/h via INTRAVENOUS
  Filled 2023-04-30 (×3): qty 100

## 2023-04-30 MED ORDER — AMIODARONE HCL IN DEXTROSE 360-4.14 MG/200ML-% IV SOLN
60.0000 mg/h | INTRAVENOUS | Status: AC
  Administered 2023-04-30: 60 mg/h via INTRAVENOUS
  Filled 2023-04-30: qty 200

## 2023-04-30 MED ORDER — AMIODARONE LOAD VIA INFUSION
150.0000 mg | Freq: Once | INTRAVENOUS | Status: DC
Start: 2023-04-30 — End: 2023-05-01
  Filled 2023-04-30: qty 83.34

## 2023-04-30 MED ORDER — LACTATED RINGERS IV BOLUS: 500.0000 mL | Freq: Once | INTRAVENOUS | Status: DC

## 2023-04-30 MED ORDER — INSULIN ASPART 100 UNIT/ML IJ SOLN
10.0000 [IU] | INTRAMUSCULAR | Status: DC
  Administered 2023-04-30 (×3): 10 [IU] via SUBCUTANEOUS
  Filled 2023-04-30 (×3): qty 1

## 2023-04-30 NOTE — Progress Notes (Addendum)
 Progress Note   Patient: Billy Cherry UJW:[id number] DOB: 14-Dec-1970 DOA: 04/18/2023     12 DOS: the patient was seen and examined on 04/30/2023   Brief hospital course: 53 y.o. 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   Patient is nonverbal.  Per her wife at the bedside, patient has increased shortness of breath with increased tracheal secretion in the past several days.  No fever or chills.  Patient has increased fluid retention, particularly in arms per her wife.  Patient does not see to have chest pain, nausea, vomiting, abdominal pain per his wife.  Patient was found to have oxygen desaturation to 88% on room air, which improved to 99% of 4L -6L oxygen in ED.    Data reviewed independently and ED Course: pt was found to have BNP 349, potassium 5.8, GFR> 60, WBC 17.1, negative PCR for flu, COVID and RSV, troponin 13 --> 15, procalcitonin 0.17.  Chest x-ray showed bilateral basilar opacity (the left is worse than the right).  Temperature 99.1, blood pressure 125/100, heart rate of 108, RR 22.  Patient is admitted to PCU as inpatient.  2/14.  Patient on 40% trach collar.  Patient on IV Lasix to remove fluid and IV antibiotics for pneumonia. 2/15.  Creatinine went up to 1.6 will decrease Lasix down to 20 mg IV twice daily.  Switch tube feedings over to Glucerna that he takes at home. Dietitian to adjust rate to goal and free water. 2/16.  Creatinine up at 1.66.  Will hold Lasix for the rest of today and recheck creatinine tomorrow.  Try to get patient down to his baseline oxygen.  Echocardiogram shows an EF of 30 to 35% stable from previous. 2/17.  Creatinine up at 1.85.  Patient spiked a fever of 102.  Will get blood cultures and change antibiotics to Zithromax and Zosyn.   Chest x-ray showing left lower lobe pneumonia.  Repeat COVID RSV and influenza negative. 2/18.  Creatinine up at 2.22.  Maintenance fluids started.  Added vancomycin. 2/19: Creatinine continued to get worse, currently at 2.91 with BUN of 96, IV Lasix has been discontinued and he was getting some IV fluid.  No significant volume on bladder scan.  Continue to have fever, maximum recorded at 101.8 over the past 24-hour.  MRSA PCR was negative so vancomycin is being discontinued.  Blood cultures from 12/17 remain negative.  Repeat sputum cultures pending consulting infectious disease, CT chest, abdomen and pelvis, UA with urine culture was ordered. EEG with severe diffuse encephalopathy, no seizure or definitive epileptiform discharges were noted. 2/20: Afebrile this morning but maximum temperature recorded was 100.6 over the past 24-hour.  Worsening renal function, UA with protein urea and rare bacteria, urine cultures pending, CT chest, abdomen and pelvis with cardiomegaly, small bilateral pleural effusion and mild septal thickening, bilateral perihilar groundglass density suggestive of some pulmonary edema.  Also has a confluent consolidation in the left lower lobe and partial right lower lobe suspicious for pneumonia or aspiration. No evidence of any acute intra-abdominal abnormality. Stopping gentle IV fluid and ordered 1 dose of IV Lasix 40 mg Renal ultrasound with some renal echogenicity, no hydronephrosis. Also consulted nephrology 2/21: Remained afebrile, worsening hyperkalemia with potassium at 5.6, worsening renal function with BUN 126 and creatinine 3.48, urine cultures negative, repeat sputum cultures with  Pseudomonas and Proteus-continuing Zosyn.  Ordered some Lokelma, nephrology is recommending switching Glucerna with Nepro due to high potassium content.  Need to monitor potassium very closely, patient might require dialysis if renal function and potassium continue to get worse.  Patient with  significant life limiting comorbidities, seems like multiorgan dysfunction, palliative care was again offered but wife declined.  2/22: Patient with concern of worsening hypoxia requiring higher level of FiO2 and was placed on 10 L of oxygen.  Intermittently having apneic episodes.  Repeat chest x-ray again with concern of pulmonary vascular congestion, received 1 dose of IV Lasix 80 mg, renal function seems plateaued now.  After discussing with nephrology patient was started on Lasix infusion. Also discussed with PCCM and they want him to transfer to ICU for close monitoring as he is high risk to go on a ventilator. Ordered ABG and repeating lactic acid  2/23: Patient underwent bronchoscopy with removal of mucous plugging with pulmonary yesterday.  BAL was sent for culture, remained on 12 L of oxygen with 50% FiO2. Renal function stable with GFR of 20, worsening BUN at 146.  UOP of 1890 recorded over the past 24-hour, patient still quite edematous and on Lasix infusion.  Patient also had diarrhea yesterday, Foley catheter and rectal tube was placed in ICU  2/24: Worsening hypoxia, now placed on heated high flow.  BAL cultures growing Enterococcus faecium and Pseudomonas, patient is on Zosyn PCCM is also on board.  Renal function seems plateaued with worsening BUN which can be due to tube feed.  2/25: Worsening breathing status, now on maximum setting of heated high flow.  Creatinine seems stable with good UOP but BUN continue to get worse.  Another discussion with wife on phone but she wants every possible help, again discussed involving palliative but she does not want it, patient likely need to go on ventilatory support.  PCCM consulted ENT for trach exchange.  Will also need dialysis, nephrology is not sure how he will continue as outpatient under current circumstances.  ID was reconsulted as patient remained afebrile with improving leukocytosis, cultures remain positive with worsening respiratory  status.  Patient is critically ill with significant underlying comorbidities and severe anoxic brain injury, extremely poor functional status, increased risk for mortality.  Addendum.  Patient later coded right after the trach exchange.  ROSC was obtained. PCCM is not taking over.   Assessment and Plan: * Multifocal pneumonia   Initial sputum culture grew Proteus and providencia, which were treated appropriately. Patient had a fever of 102 on 2/17.  Antibiotics switched to Zosyn, Zithromax and added vancomycin back.  Blood cultures so far negative.  Repeat COVID influenza and RSV negative.  Sputum culture growing Pseudomonas and Proteus -As he continued to have fever spikes-ID was consulted -Repeat blood and urine cultures negative,  CT chest abdomen and pelvis with concern of bilateral pneumonia. -Vancomycin was discontinued as MRSA PCR was negative -Continue with Zosyn  -S/p bronchoscopy with removal of mucous plugging and BAL  culture with Pseudomonas and Enterococcus faecium -ID was reconsulted to help with antibiotic.  Acute on chronic systolic CHF (congestive heart failure) ([name]) Gentle fluids started with worsening kidney function.  Patient on tube feedings.  Continue Coreg.  Repeat echo showed a stable EF of 30 to 35%.  Weight on 2/13 was 263 pounds and not sure how accurate that weight is.  Weight on 2/16 was 232.37.  Today's weight 248.46.  Continue to monitor. pulmonary edema on CT chest,  -Currently  on Lasix infusion.  Acute kidney injury superimposed on CKD ([name]) AKI on CKD stage II.  Creatinine now at least plateaued, currently at 3.38 but BUN continue to get worse.CT chest with concern of some pulmonary edema.  No significant urinary retention and renal ultrasound without any hydronephrosis.  Good UOP Nephrology consult -Patient was started on Lasix infusion for gentle diuresis -Patient might need dialysis-will be difficult for outpatient placement due to underlying  functional status. -Monitor renal function -Strict intake and output -Avoid nephrotoxins  HCAP (healthcare-associated pneumonia)-resolved as of 04/19/2023 Patient started on Maxipime and Flagyl.  Vancomycin was discontinued with MRSA PE CR being negative  Acute on chronic hypoxic respiratory failure ([name]) Patient with slowly worsening hypoxia and now started getting apneic spells.  No significant secretions on suctioning.   S/p bronchoscopy with removal of mucous plugging, BAL  cultures-grew Pseudomonas and Enterococcus faecium. Currently on maximum setting of heated high flow, likely need to go on a ventilator soon. PCCM consulted ENT for trach exchange. -Further respiratory management per ICU   Hyperkalemia Resolved -Switched dietary formula from Glucerna to Nepro -Continue to monitor  Seizure disorder ([name]) Continue Keppra and Depakote.  Will get an EEG.  Essential hypertension Continue Coreg  Pulmonary embolus ([name]) On Eliquis  DVT (deep venous thrombosis) ([name]) On Eliquis  S/P percutaneous endoscopic gastrostomy (PEG) tube placement ([name]) Glucerna was switched with Nepro due to worsening renal function and hyperkalemia. 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  Anoxic encephalopathy ([name]) Supportive care      Subjective: Patient remained pretty much unresponsive.  Worsening oxygen requirement.  Physical Exam: Vitals:   04/30/23 1300 04/30/23 1400 04/30/23 1500 04/30/23 1600  BP: 124/89 (!) 144/96 (!) 134/95 (!) 144/94  Pulse: (!) 51 (!) 50 (!) 105 (!) 52  Resp: (!) 22 16 17 17   Temp:    99.1 F (37.3 C)  TempSrc:    Oral  SpO2: 90% 91% 91% 91%  Weight:      Height:       General.  Unresponsive  gentleman, in no acute distress. Pulmonary.  Lungs clear bilaterally, normal respiratory effort. CV.  Regular rate and rhythm, no JVD, rub or murmur. Abdomen.  Soft, nontender, nondistended, BS positive. CNS.  Unresponsive. Extremities.  Upper extremity edema,  pulses intact and symmetrical.  Data Reviewed: Prior data reviewed  Family Communication: Discussed with wife on phone  Disposition: Status is: Inpatient Remains inpatient appropriate because: Severity of illness  Planned Discharge Destination: To be determined  DVT prophylaxis.  Eliquis Time spent: 55 minutes  This record has been created using Conservation officer, historic buildings. Errors have been sought and corrected,but may not always be located. Such creation errors do not reflect on the standard of care.   Author: [name], MD 04/30/2023 5:08 PM  For on call review [email or web address].

## 2023-04-30 NOTE — TOC Progression Note (Signed)
 Transition of Care [facility]) - Progression Note    Patient Details  Name: Billy Cherry MRN: [id number] Date of Birth: [birth year]  [facility] [place] CM/SW Contact  Margarito Liner, LCSW Phone Number: 04/30/2023, 1:43 PM  Clinical Narrative:   TOC continues to follow for discharge needs. Patient is on high flow oxygen.  Expected Discharge Plan: Home w Home Health Services Barriers to Discharge: Continued Medical Work up  Expected Discharge Plan and Services     Post Acute Care Choice: Skilled Nursing Facility Living arrangements for the past 2 months: Single Family Home                                       Social Determinants of Health (SDOH) Interventions SDOH Screenings   Food Insecurity: Patient Declined (04/19/2023)  Housing: Patient Unable To Answer (04/19/2023)  Transportation Needs: Patient Unable To Answer (04/19/2023)  Utilities: Patient Unable To Answer (04/19/2023)  Financial Resource Strain: High Risk (07/04/2022)   Received from [facility]  Tobacco Use: Low Risk  (12/27/2022)    Readmission Risk Interventions    04/22/2023    4:20 PM 05/31/2022    2:30 PM 01/08/2022    3:18 PM  Readmission Risk Prevention Plan  Transportation Screening Complete Complete Complete  PCP or Specialist Appt within 3-5 Days Complete Complete Complete  HRI or Home Care Consult Complete Complete Complete  Social Work Consult for Recovery Care Planning/Counseling Complete Complete Complete  Palliative Care Screening Not Applicable Not Applicable Not Applicable  Medication Review Oceanographer) Complete Complete Complete

## 2023-04-30 NOTE — Procedures (Signed)
 Central Venous Catheter Insertion Procedure Note  Billy Cherry  [id number]  1970-04-12  Date:04/30/23  Time:5:31 PM   Provider Performing:[name]   Procedure: Insertion of Non-tunneled Central Venous [phone]) with US guidance ([id number])   Indication(s) Medication administration and Difficult access  Consent Risks of the procedure as well as the alternatives and risks of each were explained to the patient and/or caregiver.  Consent for the procedure was obtained and is signed in the bedside chart  Anesthesia Topical only with 1% lidocaine   Timeout Verified patient identification, verified procedure, site/side was marked, verified correct patient position, special equipment/implants available, medications/allergies/relevant history reviewed, required imaging and test results available.  Sterile Technique Maximal sterile technique including full sterile barrier drape, hand hygiene, sterile gown, sterile gloves, mask, hair covering, sterile ultrasound probe cover (if used).  Procedure Description Area of catheter insertion was cleaned with chlorhexidine and draped in sterile fashion.  With real-time ultrasound guidance a central venous catheter was placed into the right femoral vein. Nonpulsatile blood flow and easy flushing noted in all ports.  The catheter was sutured in place and sterile dressing applied.  Complications/Tolerance None; patient tolerated the procedure well. Chest X-ray is ordered to verify placement for internal jugular or subclavian cannulation.   Chest x-ray is not ordered for femoral cannulation.  EBL Minimal  Specimen(s) None  Billy Cherry, AGNP  Pulmonary/Critical Care Pager [phone] (please enter 7 digits) PCCM Consult Pager [phone] (please enter 7 digits)

## 2023-04-30 NOTE — Consult Note (Signed)
 Billy Cherry, Billy Cherry [id number] 1970/07/12 Billy Courser, MD  Reason for Consult: Tracheostomy tube change from a cuffless to a cuffed trach for ventilation purposes  HPI: Chart reviewed.  Asked to be seen by the ICU team for tracheostomy tube change.  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  Was recently admitted for exacerbation of multiple medical problems particularly pneumonia.  Dr. Belia Heman has asked [place] to change the tracheostomy tube from a cuffless trach to a tracheostomy tube with a cuff so that he can be ventilated.  Allergies:  Allergies  Allergen Reactions   Lisinopril Swelling    angioedema  Other Reaction(s): angioedema    ROS: Review of systems normal other than 12 systems except per HPI.  PMH:  Past Medical History:  Diagnosis Date   Anoxic brain injury ([name])    Hypertension     FH: No family history on file.  SH:  Social History   Socioeconomic History   Marital status: Married    Spouse name: Not on file   Number of children: Not on file   Years of education: Not on file   Highest education level: Not on file  Occupational History   Not on file  Tobacco Use   Smoking status: Never   Smokeless tobacco: Never  Vaping Use   Vaping status: Unknown  Substance and Sexual Activity   Alcohol use: Yes   Drug use: Never   Sexual activity: Never  Other Topics Concern   Not on file  Social History Narrative   Not on file   Social Drivers of Health   Financial Resource Strain: High Risk (07/04/2022)   [facility] from [facility]   Overall Financial Resource Strain (CARDIA)    Difficulty of Paying Living Expenses: Hard  Food Insecurity: Patient Declined (04/19/2023)   Hunger Vital Sign    Worried About Running Out of Food in the Last Year: Patient declined    Ran Out of Food in the Last Year: Patient declined  Transportation Needs: Patient Unable To Answer (04/19/2023)   PRAPARE - Transportation    Lack of  Transportation (Medical): Patient unable to answer    Lack of Transportation (Non-Medical): Patient unable to answer  Physical Activity: Not on file  Stress: Not on file  Social Connections: Not on file  Intimate Partner Violence: Unknown (04/19/2023)   Humiliation, Afraid, Rape, and Kick questionnaire    Fear of Current or Ex-Partner: Patient unable to answer    Emotionally Abused: Patient unable to answer    Physically Abused: Not on file    Sexually Abused: Patient unable to answer    PSH:  Past Surgical History:  Procedure Laterality Date   BACK SURGERY     CORONARY/GRAFT ACUTE MI REVASCULARIZATION N/A 03/23/2021   Procedure: Coronary/Graft Acute MI Revascularization;  Surgeon: Marcina Millard, MD;  Location: [facility] INVASIVE CV LAB;  Service: Cardiovascular;  Laterality: N/A;   IR GASTROSTOMY TUBE MOD SED  04/26/2021   LEFT HEART CATH AND CORONARY ANGIOGRAPHY N/A 03/23/2021   Procedure: LEFT HEART CATH AND CORONARY ANGIOGRAPHY;  Surgeon: Marcina Millard, MD;  Location: [facility] INVASIVE CV LAB;  Service: Cardiovascular;  Laterality: N/A;   SHOULDER SURGERY      Physical  Exam: Patient is nonresponsive in the ICU critically ill.  He has a #6 uncuffed tracheostomy tube in position.  Procedure: A small neck roll was placed gently.  The patient's neck was then gently extended.  The tracheostomy tube  was clean and dry and appeared to have a well-healed tract.  A #6 cuffed Shiley tracheostomy tube was chosen.  The cuff was checked and was intact.  The soft trach ties were removed the indwelling tracheostomy tube was gently removed and the #6 cuffed Shiley was replaced.  This went in easily with a well-healed tract.  The inner cannula was placed and the patient was attach to ventilation.  The trache ties were reapplied.  Excellent breath sounds and good movement of air was established.  The entire trach change was less than 10 seconds.     A/P: Status post tracheostomy changed to a  cuffed tube for ventilation purposes.  Patient remains critically ill but now he should be able to have mechanical ventilation for better oxygen support during his pneumonia.  Should he recover this tube can be changed back to a cuffless trach, however it appears that the patient is nonvocal so this really is not needed.  Feel free to reconsult should there be any further ENT issues.    ICU consult time approximately 30 minutes  Davina Poke 04/30/2023 4:57 PM

## 2023-04-30 NOTE — Progress Notes (Signed)
 [facility]  ROUNDING NOTE   Subjective:   Patient seen laying in bed Eyes closed today No family present   Creatinine 3.48 Tube feeds 50 ml/hr Urine output  . IV furosemide drip  Objective:  Vital signs in last 24 hours:  Temp:  [97.7 F (36.5 C)-99.7 F (37.6 C)] 98.6 F (37 C) (02/25 1000) Pulse Rate:  [50-110] 50 (02/25 1000) Resp:  [12-21] 14 (02/25 1000) BP: (119-149)/(74-98) 125/75 (02/25 1000) SpO2:  [87 %-98 %] 94 % (02/25 1000) FiO2 (%):  [60 %-80 %] 80 % (02/25 0751)  Weight change:  Filed Weights   04/25/23 0500 04/26/23 0456 04/27/23 0500  Weight: 117.2 kg 117.5 kg 119.7 kg    Intake/Output: I/O last 3 completed shifts: In: 2012.4 [I.V.:68.8; Other:60; NG/GT:1593.7; IV Piggyback:290] Out: 2160 [Urine:1760; Stool:400]   Intake/Output this shift:  Total I/O In: -  Out: 125 [Urine:125]  Physical Exam: General: NAD  Head: Normocephalic, atraumatic. Moist oral mucosal membranes  Eyes: Anicteric  Lungs:  Course, trach collar  Heart: Regular rate and rhythm  Abdomen:  Soft, nontender, PEG tube  Extremities: Dependent peripheral edema.  Neurologic: Eyes closed, unable to follow commands  Skin: Stage II sacral decubitus  Access: None    Basic Metabolic Panel: Recent Labs  Lab 04/26/23 0615 04/26/23 1441 04/27/23 1703 04/28/23 0436 04/29/23 0910 04/29/23 2020 04/30/23 0505  NA 143   < > 142 141 144 144 145  K 5.6*   < > 5.0 5.1 4.8 4.6 4.4  CL 102   < > 99 99 99 97* 98  CO2 29   < > 27 31 33* 33* 33*  GLUCOSE 188*   < > 216* 225* 293* 304* 298*  BUN 126*   < > 144* 147* 177* 185* 185*  CREATININE 3.48*   < > 3.62* 3.54* 3.38* 3.52* 3.48*  CALCIUM 8.4*   < > 8.5* 8.7* 8.9 9.1 8.9  MG  --   --   --   --   --  3.6* 3.7*  PHOS 7.5*  --   --   --  6.6* 6.2* 5.9*   < > = values in this interval not displayed.    Liver Function Tests: Recent Labs  Lab 04/26/23 0615 04/29/23 0910  ALBUMIN 2.4* 2.1*   No results for  input(s): "LIPASE", "AMYLASE" in the last 168 hours. No results for input(s): "AMMONIA" in the last 168 hours.  CBC: Recent Labs  Lab 04/25/23 0623 04/26/23 0615 04/28/23 0436 04/29/23 0910 04/30/23 0505  WBC 10.3 9.8 14.8* 11.3* 10.4  HGB 8.8* 8.9* 8.0* 7.6* 7.4*  HCT 29.2* 29.3* 26.6* 25.7* 24.9*  MCV 109.0* 108.9* 110.8* 112.2* 111.7*  PLT 160 175 185 195 202    Cardiac Enzymes: No results for input(s): "CKTOTAL", "CKMB", "CKMBINDEX", "TROPONINI" in the last 168 hours.  BNP: Invalid input(s): "POCBNP"  CBG: Recent Labs  Lab 04/29/23 2008 04/29/23 2337 04/30/23 0349 04/30/23 0735 04/30/23 1134  GLUCAP 251* 296* 259* 264* 295*    Microbiology: Results for orders placed or performed during the hospital encounter of 04/18/23  Resp panel by RT-PCR (RSV, Flu A&B, Covid) Anterior Nasal Swab     Status: None   Collection Time: 04/18/23  3:44 PM   Specimen: Anterior Nasal Swab  Result Value Ref Range Status   SARS Coronavirus 2 by RT PCR NEGATIVE NEGATIVE Final    Comment: (NOTE) SARS-CoV-2 target nucleic acids are NOT DETECTED.  The SARS-CoV-2 RNA is generally  detectable in upper respiratory specimens during the acute phase of infection. The lowest concentration of SARS-CoV-2 viral copies this assay can detect is 138 copies/mL. A negative result does not preclude SARS-Cov-2 infection and should not be used as the sole basis for treatment or other patient management decisions. A negative result may occur with  improper specimen collection/handling, submission of specimen other than nasopharyngeal swab, presence of viral mutation(s) within the areas targeted by this assay, and inadequate number of viral copies(<138 copies/mL). A negative result must be combined with clinical observations, patient history, and epidemiological information. The expected result is Negative.  Fact Sheet for Patients:  BloggerCourse.com  Fact Sheet for  Healthcare Providers:  SeriousBroker.it  This test is no t yet approved or cleared by the [place] FDA and  has been authorized for detection and/or diagnosis of SARS-CoV-2 by FDA under an Emergency Use Authorization (EUA). This EUA will remain  in effect (meaning this test can be used) for the duration of the COVID-19 declaration under Section 564(b)(1) of the Act, 21 U.S.C.section 360bbb-3(b)(1), unless the authorization is terminated  or revoked sooner.       Influenza A by PCR NEGATIVE NEGATIVE Final   Influenza B by PCR NEGATIVE NEGATIVE Final    Comment: (NOTE) The Xpert Xpress SARS-CoV-2/FLU/RSV plus assay is intended as an aid in the diagnosis of influenza from Nasopharyngeal swab specimens and should not be used as a sole basis for treatment. Nasal washings and aspirates are unacceptable for Xpert Xpress SARS-CoV-2/FLU/RSV testing.  Fact Sheet for Patients: BloggerCourse.com  Fact Sheet for Healthcare Providers: SeriousBroker.it  This test is not yet approved or cleared by the [place] FDA and has been authorized for detection and/or diagnosis of SARS-CoV-2 by FDA under an Emergency Use Authorization (EUA). This EUA will remain in effect (meaning this test can be used) for the duration of the COVID-19 declaration under Section 564(b)(1) of the Act, 21 U.S.C. section 360bbb-3(b)(1), unless the authorization is terminated or revoked.     Resp Syncytial Virus by PCR NEGATIVE NEGATIVE Final    Comment: (NOTE) Fact Sheet for Patients: BloggerCourse.com  Fact Sheet for Healthcare Providers: SeriousBroker.it  This test is not yet approved or cleared by the [place] FDA and has been authorized for detection and/or diagnosis of SARS-CoV-2 by FDA under an Emergency Use Authorization (EUA). This EUA will remain in effect (meaning this  test can be used) for the duration of the COVID-19 declaration under Section 564(b)(1) of the Act, 21 U.S.C. section 360bbb-3(b)(1), unless the authorization is terminated or revoked.  Performed at [facility], [address] [place], [place] [address]   Blood culture (routine x 2)     Status: None   Collection Time: 04/18/23  5:30 PM   Specimen: BLOOD  Result Value Ref Range Status   Specimen Description BLOOD BLOOD LEFT FOREARM  Final   Special Requests   Final    BOTTLES DRAWN AEROBIC AND ANAEROBIC Blood Culture results may not be optimal due to an inadequate volume of blood received in culture bottles   Culture   Final    NO GROWTH 5 DAYS Performed at [facility], [address], [place], [place] [address]    Report Status 04/23/2023 FINAL  Final  Blood culture (routine x 2)     Status: None   Collection Time: 04/18/23  7:42 PM   Specimen: BLOOD  Result Value Ref Range Status   Specimen Description BLOOD BLOOD RIGHT HAND  Final  Special Requests   Final    BOTTLES DRAWN AEROBIC AND ANAEROBIC Blood Culture adequate volume   Culture   Final    NO GROWTH 5 DAYS Performed at [facility], [address] [place], [place] [address]    Report Status 04/23/2023 FINAL  Final  Expectorated Sputum Assessment w Gram Stain, Rflx to Resp Cult     Status: None   Collection Time: 04/18/23  8:32 PM   Specimen: Sputum  Result Value Ref Range Status   Specimen Description SPUTUM  Final   Special Requests NONE  Final   Sputum evaluation   Final    THIS SPECIMEN IS ACCEPTABLE FOR SPUTUM CULTURE Performed at [facility], [address], [place], [place] [address]    Report Status 04/18/2023 FINAL  Final  Culture, Respiratory w Gram Stain     Status: None   Collection Time: 04/18/23  8:32 PM   Specimen: SPU  Result Value Ref Range Status   Specimen Description   Final    SPUTUM Performed at [facility], [address]  [place], [place] [address]    Special Requests   Final    NONE Reflexed from [phone] Performed at [facility], [address] [place], [place] [address]    Gram Stain   Final    FEW SQUAMOUS EPITHELIAL CELLS PRESENT FEW WBC PRESENT,BOTH PMN AND MONONUCLEAR FEW YEAST FEW GRAM NEGATIVE RODS FEW GRAM POSITIVE RODS FEW GRAM POSITIVE COCCI IN PAIRS Performed at [facility], [address] [place], [place] [address]    Culture   Final    ABUNDANT PROVIDENCIA RETTGERI ABUNDANT PROTEUS MIRABILIS    Report Status 04/21/2023 FINAL  Final   Organism ID, Bacteria PROVIDENCIA RETTGERI  Final   Organism ID, Bacteria PROTEUS MIRABILIS  Final      Susceptibility   Proteus mirabilis - MIC*    AMPICILLIN <=2 SENSITIVE Sensitive     CEFEPIME <=0.12 SENSITIVE Sensitive     CEFTAZIDIME <=1 SENSITIVE Sensitive     CEFTRIAXONE <=0.25 SENSITIVE Sensitive     CIPROFLOXACIN <=0.25 SENSITIVE Sensitive     GENTAMICIN <=1 SENSITIVE Sensitive     IMIPENEM 2 SENSITIVE Sensitive     TRIMETH/SULFA <=20 SENSITIVE Sensitive     AMPICILLIN/SULBACTAM <=2 SENSITIVE Sensitive     PIP/TAZO <=4 SENSITIVE Sensitive ug/mL    * ABUNDANT PROTEUS MIRABILIS   Providencia rettgeri - MIC*    AMPICILLIN >=32 RESISTANT Resistant     CEFEPIME <=0.12 SENSITIVE Sensitive     CEFTAZIDIME <=1 SENSITIVE Sensitive     CEFTRIAXONE <=0.25 SENSITIVE Sensitive     CIPROFLOXACIN <=0.25 SENSITIVE Sensitive     GENTAMICIN <=1 SENSITIVE Sensitive     IMIPENEM 2 SENSITIVE Sensitive     TRIMETH/SULFA <=20 SENSITIVE Sensitive     AMPICILLIN/SULBACTAM 16 INTERMEDIATE Intermediate     PIP/TAZO <=4 SENSITIVE Sensitive ug/mL    * ABUNDANT PROVIDENCIA RETTGERI  MRSA Next Gen by PCR, Nasal     Status: None   Collection Time: 04/19/23 12:52 AM   Specimen: Nasal Mucosa; Nasal Swab  Result Value Ref Range Status   MRSA by PCR Next Gen NOT DETECTED NOT DETECTED Final    Comment: (NOTE) The GeneXpert MRSA Assay (FDA approved for  NASAL specimens only), is one component of a comprehensive MRSA colonization surveillance program. It is not intended to diagnose MRSA infection nor to guide or monitor treatment for MRSA infections. Test performance is not FDA approved in  patients less than 24 years old. Performed at [facility], [address] [place], [place] [address]   Culture, blood (Routine X 2) w Reflex to ID Panel     Status: None   Collection Time: 04/22/23 12:27 PM   Specimen: BLOOD  Result Value Ref Range Status   Specimen Description BLOOD BLOOD LEFT HAND  Final   Special Requests   Final    BOTTLES DRAWN AEROBIC AND ANAEROBIC Blood Culture adequate volume   Culture   Final    NO GROWTH 5 DAYS Performed at [facility], [address] [place], [place] [address]    Report Status 04/27/2023 FINAL  Final  Culture, blood (Routine X 2) w Reflex to ID Panel     Status: None   Collection Time: 04/22/23 12:37 PM   Specimen: BLOOD  Result Value Ref Range Status   Specimen Description BLOOD BLOOD RIGHT HAND  Final   Special Requests   Final    BOTTLES DRAWN AEROBIC AND ANAEROBIC Blood Culture adequate volume   Culture   Final    NO GROWTH 5 DAYS Performed at [facility], [address] [place], [place] [address]    Report Status 04/27/2023 FINAL  Final  Culture, Respiratory w Gram Stain     Status: None   Collection Time: 04/22/23  1:22 PM   Specimen: Tracheal Aspirate  Result Value Ref Range Status   Specimen Description   Final    TRACHEAL ASPIRATE Performed at [facility], [address], [place], [place] [address]    Special Requests   Final    NONE Performed at [facility], [address] [place], [place] [address]    Gram Stain   Final    ABUNDANT SQUAMOUS EPITHELIAL CELLS PRESENT ABUNDANT WBC PRESENT, PREDOMINANTLY PMN FEW GRAM NEGATIVE RODS RARE GRAM POSITIVE COCCI IN PAIRS Performed at [facility], [address],  [place], [place] [address]    Culture   Final    MODERATE PSEUDOMONAS AERUGINOSA MODERATE PROTEUS MIRABILIS MODERATE ENTEROCOCCUS FAECALIS    Report Status 04/27/2023 FINAL  Final   Organism ID, Bacteria PSEUDOMONAS AERUGINOSA  Final   Organism ID, Bacteria PROTEUS MIRABILIS  Final   Organism ID, Bacteria ENTEROCOCCUS FAECALIS  Final      Susceptibility   Enterococcus faecalis - MIC*    AMPICILLIN <=2 SENSITIVE Sensitive     VANCOMYCIN 2 SENSITIVE Sensitive     GENTAMICIN SYNERGY SENSITIVE Sensitive     * MODERATE ENTEROCOCCUS FAECALIS   Pseudomonas aeruginosa - MIC*    CEFTAZIDIME 4 SENSITIVE Sensitive     CIPROFLOXACIN >=4 RESISTANT Resistant     GENTAMICIN 8 INTERMEDIATE Intermediate     IMIPENEM 2 SENSITIVE Sensitive     PIP/TAZO 16 SENSITIVE Sensitive ug/mL    * MODERATE PSEUDOMONAS AERUGINOSA   Proteus mirabilis - MIC*    AMPICILLIN <=2 SENSITIVE Sensitive     CEFEPIME <=0.12 SENSITIVE Sensitive     CEFTAZIDIME <=1 SENSITIVE Sensitive     CEFTRIAXONE <=0.25 SENSITIVE Sensitive     CIPROFLOXACIN <=0.25 SENSITIVE Sensitive     GENTAMICIN <=1 SENSITIVE Sensitive     IMIPENEM 2 SENSITIVE Sensitive     TRIMETH/SULFA <=20 SENSITIVE Sensitive     AMPICILLIN/SULBACTAM <=2 SENSITIVE Sensitive     PIP/TAZO <=4 SENSITIVE Sensitive ug/mL    * MODERATE PROTEUS MIRABILIS  Resp panel by RT-PCR (RSV, Flu A&B, Covid) Anterior Nasal Swab     Status: None  Collection Time: 04/22/23  5:53 PM   Specimen: Anterior Nasal Swab  Result Value Ref Range Status   SARS Coronavirus 2 by RT PCR NEGATIVE NEGATIVE Final    Comment: (NOTE) SARS-CoV-2 target nucleic acids are NOT DETECTED.  The SARS-CoV-2 RNA is generally detectable in upper respiratory specimens during the acute phase of infection. The lowest concentration of SARS-CoV-2 viral copies this assay can detect is 138 copies/mL. A negative result does not preclude SARS-Cov-2 infection and should not be used as the sole basis for treatment  or other patient management decisions. A negative result may occur with  improper specimen collection/handling, submission of specimen other than nasopharyngeal swab, presence of viral mutation(s) within the areas targeted by this assay, and inadequate number of viral copies(<138 copies/mL). A negative result must be combined with clinical observations, patient history, and epidemiological information. The expected result is Negative.  Fact Sheet for Patients:  BloggerCourse.com  Fact Sheet for Healthcare Providers:  SeriousBroker.it  This test is no t yet approved or cleared by the [place] FDA and  has been authorized for detection and/or diagnosis of SARS-CoV-2 by FDA under an Emergency Use Authorization (EUA). This EUA will remain  in effect (meaning this test can be used) for the duration of the COVID-19 declaration under Section 564(b)(1) of the Act, 21 U.S.C.section 360bbb-3(b)(1), unless the authorization is terminated  or revoked sooner.       Influenza A by PCR NEGATIVE NEGATIVE Final   Influenza B by PCR NEGATIVE NEGATIVE Final    Comment: (NOTE) The Xpert Xpress SARS-CoV-2/FLU/RSV plus assay is intended as an aid in the diagnosis of influenza from Nasopharyngeal swab specimens and should not be used as a sole basis for treatment. Nasal washings and aspirates are unacceptable for Xpert Xpress SARS-CoV-2/FLU/RSV testing.  Fact Sheet for Patients: BloggerCourse.com  Fact Sheet for Healthcare Providers: SeriousBroker.it  This test is not yet approved or cleared by the [place] FDA and has been authorized for detection and/or diagnosis of SARS-CoV-2 by FDA under an Emergency Use Authorization (EUA). This EUA will remain in effect (meaning this test can be used) for the duration of the COVID-19 declaration under Section 564(b)(1) of the Act, 21 U.S.C. section  360bbb-3(b)(1), unless the authorization is terminated or revoked.     Resp Syncytial Virus by PCR NEGATIVE NEGATIVE Final    Comment: (NOTE) Fact Sheet for Patients: BloggerCourse.com  Fact Sheet for Healthcare Providers: SeriousBroker.it  This test is not yet approved or cleared by the [place] FDA and has been authorized for detection and/or diagnosis of SARS-CoV-2 by FDA under an Emergency Use Authorization (EUA). This EUA will remain in effect (meaning this test can be used) for the duration of the COVID-19 declaration under Section 564(b)(1) of the Act, 21 U.S.C. section 360bbb-3(b)(1), unless the authorization is terminated or revoked.  Performed at [facility], [address] [place], [place] [address]   Urine Culture (for pregnant, neutropenic or urologic patients or patients with an indwelling urinary catheter)     Status: None   Collection Time: 04/24/23  7:35 PM   Specimen: Urine, Random  Result Value Ref Range Status   Specimen Description   Final    URINE, RANDOM Performed at [facility], [address], [place], [place] [address]    Special Requests   Final    NONE Performed at [facility], [address], [place], [place] [address]    Culture   Final    NO  GROWTH Performed at [facility], [address], [place], [place] [address]    Report Status 04/25/2023 FINAL  Final  Culture, BAL-quantitative w Gram Stain     Status: Abnormal   Collection Time: 04/27/23  3:32 PM   Specimen: Bronchoalveolar Lavage; Respiratory  Result Value Ref Range Status   Specimen Description   Final    BRONCHIAL ALVEOLAR LAVAGE Performed at [facility], [address], [place], [place] [address]    Special Requests   Final    NONE Performed at [facility], [address] [place], [place] [address]    Gram Stain   Final    FEW WBC PRESENT, PREDOMINANTLY  PMN FEW GRAM NEGATIVE RODS Performed at [facility], [address], [place], [place] [address]    Culture (A)  Final    >=100,000 COLONIES/mL PSEUDOMONAS AERUGINOSA 30,000 COLONIES/mL ENTEROCOCCUS FAECIUM VANCOMYCIN RESISTANT ENTEROCOCCUS ISOLATED    Report Status 04/30/2023 FINAL  Final   Organism ID, Bacteria PSEUDOMONAS AERUGINOSA (A)  Final   Organism ID, Bacteria ENTEROCOCCUS FAECIUM (A)  Final      Susceptibility   Enterococcus faecium - MIC*    AMPICILLIN >=32 RESISTANT Resistant     VANCOMYCIN >=32 RESISTANT Resistant     GENTAMICIN SYNERGY SENSITIVE Sensitive     LINEZOLID 2 SENSITIVE Sensitive     * 30,000 COLONIES/mL ENTEROCOCCUS FAECIUM   Pseudomonas aeruginosa - MIC*    CEFTAZIDIME 4 SENSITIVE Sensitive     CIPROFLOXACIN 2 RESISTANT Resistant     GENTAMICIN <=1 SENSITIVE Sensitive     IMIPENEM 2 SENSITIVE Sensitive     CEFEPIME 2 SENSITIVE Sensitive     * >=100,000 COLONIES/mL PSEUDOMONAS AERUGINOSA  MRSA Next Gen by PCR, Nasal     Status: None   Collection Time: 04/27/23  4:50 PM   Specimen: Nasal Mucosa; Nasal Swab  Result Value Ref Range Status   MRSA by PCR Next Gen NOT DETECTED NOT DETECTED Final    Comment: (NOTE) The GeneXpert MRSA Assay (FDA approved for NASAL specimens only), is one component of a comprehensive MRSA colonization surveillance program. It is not intended to diagnose MRSA infection nor to guide or monitor treatment for MRSA infections. Test performance is not FDA approved in patients less than 60 years old. Performed at [facility], [address] [place], [place] [address]     Coagulation Studies: No results for input(s): "LABPROT", "INR" in the last 72 hours.  Urinalysis: No results for input(s): "COLORURINE", "LABSPEC", "PHURINE", "GLUCOSEU", "HGBUR", "BILIRUBINUR", "KETONESUR", "PROTEINUR", "UROBILINOGEN", "NITRITE", "LEUKOCYTESUR" in the last 72 hours.  Invalid input(s): "APPERANCEUR"     Imaging: DG  Chest Port 1 View Result Date: 04/29/2023 CLINICAL DATA:  Acute hypoxic respiratory failure EXAM: PORTABLE CHEST 1 VIEW COMPARISON:  03/25/2023 FINDINGS: Tracheostomy tube in satisfactory position poor inspiration. Stable enlarged cardiac silhouette less patchy opacity in both lungs with dense residual left lower lobe opacity. No definite pleural fluid seen. Unremarkable bones. IMPRESSION: Improved aeration of both lungs with dense residual left lower lobe atelectasis or pneumonia. Electronically Signed   By: Beckie Salts M.D.   On: 04/29/2023 13:59      Medications:    feeding supplement (NEPRO CARB STEADY) 1,000 mL (04/30/23 0412)   furosemide (LASIX) 200 mg in dextrose 5 % 100 mL (2 mg/mL) infusion 4 mg/hr (04/30/23 0405)   piperacillin-tazobactam (ZOSYN)  IV 3.375 g (04/30/23 9629)    apixaban  5 mg Per Tube BID  atropine  2 drop Sublingual QID   carvedilol  6.25 mg Per Tube BID WC   Chlorhexidine Gluconate Cloth  6 each Topical Daily   clonazePAM  0.5 mg Per Tube BID   famotidine  20 mg Per Tube BID   feeding supplement (PROSource TF20)  60 mL Per Tube Daily   free water  30 mL Per Tube Q4H   glycopyrrolate  1 mg Per Tube TID   insulin aspart  0-20 Units Subcutaneous Q4H   insulin aspart  10 Units Subcutaneous Q4H   insulin glargine-yfgn  20 Units Subcutaneous QHS   ipratropium-albuterol  3 mL Nebulization TID   levETIRAcetam  750 mg Per Tube BID   metoCLOPramide  10 mg Per Tube Q8H   nutrition supplement (JUVEN)  1 packet Per Tube BID BM   mouth rinse  15 mL Mouth Rinse 4 times per day   oxyCODONE  2.5 mg Per Tube Q8H   valproic acid  750 mg Per Tube TID   acetaminophen (TYLENOL) oral liquid 160 mg/5 mL, albuterol, guaiFENesin, iohexol, LORazepam, ondansetron (ZOFRAN) IV, mouth rinse, polyethylene glycol  Assessment/ Plan:  Billy [place] is a 53 y.o.  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  Acute kidney injury, secondary to inflammation and infection resulting in ATN.  Imaging-no hydronephrosis noted. Urinalysis-04/24/2023-small hemoglobin 11-20 RBCs, rare WBCs Likely ATN secondary to concurrent illness, pneumonia. Patient is oliguric.   Renal function remains stable.  Adequate urine output recorded.  Continue supportive care.  We feel patient will be a poor candidate for long-term dialysis.  Lab Results  Component Value Date   CREATININE 3.48 (H) 04/30/2023   CREATININE 3.52 (H) 04/29/2023   CREATININE 3.38 (H) 04/29/2023    Intake/Output Summary (Last 24 hours) at 04/30/2023 1141 Last data filed at 04/30/2023 0821 Gross per 24 hour  Intake 1425.77 ml  Output 1485 ml  Net -59.23 ml    2.  Hyperkalemia Potassium 4.4.  Will stop daily Lokelma.   LOS: 12 Olusegun Gerstenberger 2/25/202511:41 AM

## 2023-04-30 NOTE — Significant Event (Addendum)
 Significant Event   Following tracheostomy change from a cuffless to a cuffed trach by ENT for ventilation purposes pt went into SVT immediately followed by pulseless Vtach at 1628.  CPR and ACLS protocol initiated.  ROSC achieved at 1640.  Post cardiac arrest amiodarone gtt initiated at 60 mg/hr and pt returned to NSR with hr in the 80's. Due to hypotension neo-synephrine gtt started to maintain map 65 or higher.  See cardiopulmonary resuscitation record for details.  Pts spouse Theola Sequin notified by attending Dr. Nelson Chimes of significant events via telephone.  I spoke with pts wife to obtain consent for central line placement along with RN Cleda Clarks and she gave consent to proceed with procedure.  She stated she was en route to the hospital.  Pt now awake.  Will continue to monitor and assess pt.    Zada Girt, AGNP  Pulmonary/Critical Care Pager [phone] (please enter 7 digits) PCCM Consult Pager [phone] (please enter 7 digits)

## 2023-04-30 NOTE — Progress Notes (Signed)
 Date and time results received: 04/30/23 1825   Test: lactic acid Critical Value: 4.3  Name of Provider Notified: Zada Girt NP

## 2023-04-30 NOTE — Progress Notes (Signed)
 Chaplain responds to code blue and provides compassionate presence as team resuscitates pt. They are successful and speak to his wife on the phone. Chaplain waits for wife's arrival and, when she hasn't arrived after 45 minutes, requests unit secretary page chaplain when she does.

## 2023-04-30 NOTE — Progress Notes (Signed)
 Reason for Consult:Acute on chronic hypoxic respiratory failure Referring Physician: Dr. [name]  Billy Cherry is an 53 y.o. male.  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  Patient has been transferred to the ICU for worsening hypoxemia, prolonged periods of apnea on trach collar, increased copious secretions and persistent fever.  Imaging studies showed bilateral pneumonia.  Patient was started on empiric antibiotics.  With vancomycin and Zosyn and subsequently vancomycin was discontinued the patient was maintained on Zosyn. Patient's kidney function has also been deteriorating since admission.  His baseline creatinine 1.26 and has now increased to 3.59 with a BUN of 138 mg/dL.  His GFR is down to 20.  He remains tachycardic and his SpO2 is fluctuating between 93 and 98% on 10 L of oxygen via trach collar.  04/29/23- Patient requires 70%HHFNC.  Reviewed Rx with pharmacist and refined pneumonia regimen. He has below +micro fromBAL.  Now on Zosyn IV.  04/30/23- patient is still critically ill with poor prognosis.  Leukocysotis has resolved. Hyperglycemia is worse this am.  Renal function is stable    2 d ago   Specimen Description BRONCHIAL ALVEOLAR LAVAGE Performed at [facility], [address] [place], [place] [address]  Special Requests NONE Performed at [facility], [address] [place], [place] [address]  Gram Stain FEW WBC PRESENT, PREDOMINANTLY PMN FEW GRAM NEGATIVE RODS  Culture  Abnormal  >=100,000 COLONIES/mL PSEUDOMONAS AERUGINOSA 30,000 COLONIES/mL ENTEROCOCCUS FAECIUM SUSCEPTIBILITIES TO FOLLOW    Past Medical History:  Diagnosis Date   Anoxic brain  injury ([name])    Hypertension     Past Surgical History:  Procedure Laterality Date   BACK SURGERY     CORONARY/GRAFT ACUTE MI REVASCULARIZATION N/A 03/23/2021   Procedure: Coronary/Graft Acute MI Revascularization;  Surgeon: Marcina Millard, MD;  Location: [facility] INVASIVE CV LAB;  Service: Cardiovascular;  Laterality: N/A;   IR GASTROSTOMY TUBE MOD SED  04/26/2021   LEFT HEART CATH AND CORONARY ANGIOGRAPHY N/A 03/23/2021   Procedure: LEFT HEART CATH AND CORONARY ANGIOGRAPHY;  Surgeon: Marcina Millard, MD;  Location: [facility] INVASIVE CV LAB;  Service: Cardiovascular;  Laterality: N/A;   SHOULDER SURGERY      No family history on file.  Social History:  reports that he has never smoked. He has never used smokeless tobacco. He reports current alcohol use. He reports that he does not use drugs.  Allergies:  Allergies  Allergen Reactions   Lisinopril Swelling    angioedema  Other Reaction(s): angioedema     Current Facility-Administered Medications:    acetaminophen (TYLENOL) 160 MG/5ML solution 650 mg, 650 mg, Per Tube, Q6H PRN, Lorretta Harp, MD, 650 mg at 04/24/23 0428   albuterol (PROVENTIL) (2.5 MG/3ML) 0.083% nebulizer solution 2.5 mg, 2.5 mg, Nebulization, Q4H PRN, Lorretta Harp, MD   apixaban (ELIQUIS) tablet 5 mg, 5 mg, Per Tube, BID, Lorretta Harp, MD, 5 mg at 04/30/23 0981   atropine 1 % ophthalmic solution 2 drop, 2 drop, Sublingual, QID, Lorretta Harp, MD, 2 drop at 04/30/23 0814   budesonide (PULMICORT) nebulizer solution 0.25 mg, 0.25 mg, Nebulization, BID, Wieting, Richard, MD, 0.25 mg at 04/30/23 0751   carvedilol (COREG) tablet 6.25 mg, 6.25 mg, Per Tube, BID WC, Alford [name], MD,  6.25 mg at 04/30/23 6063   Chlorhexidine Gluconate Cloth 2 % PADS 6 each, 6 each, Topical, Daily, Erin Fulling, MD, 6 each at 04/29/23 0847   clonazePAM (KLONOPIN) tablet 0.5 mg, 0.5 mg, Per Tube, BID, Lorretta Harp, MD, 0.5 mg at 04/30/23 0160   famotidine (PEPCID) tablet 20 mg, 20 mg, Per Tube,  BID, Lorretta Harp, MD, 20 mg at 04/30/23 1093   feeding supplement (NEPRO CARB STEADY) liquid 1,000 mL, 1,000 mL, Per Tube, Continuous, [name], MD, Last Rate: 50 mL/hr at 04/30/23 0412, 1,000 mL at 04/30/23 0412   feeding supplement (PROSource TF20) liquid 60 mL, 60 mL, Per Tube, Daily, [name], MD, 60 mL at 04/30/23 0921   free water 30 mL, 30 mL, Per Tube, Q4H, [name], MD, 30 mL at 04/30/23 0814   furosemide (LASIX) 200 mg in dextrose 5 % 100 mL (2 mg/mL) infusion, 4 mg/hr, Intravenous, Continuous, [name], MD, Last Rate: 2 mL/hr at 04/30/23 0405, 4 mg/hr at 04/30/23 0405   glycopyrrolate (ROBINUL) tablet 1 mg, 1 mg, Per Tube, TID, Lorretta Harp, MD, 1 mg at 04/30/23 0807   guaiFENesin (ROBITUSSIN) 100 MG/5ML liquid 200 mg, 200 mg, Per Tube, Q4H PRN, Lorretta Harp, MD, 200 mg at 04/23/23 1638   insulin aspart (novoLOG) injection 0-20 Units, 0-20 Units, Subcutaneous, Q4H, Ezequiel Essex, NP, 11 Units at 04/30/23 0808   insulin aspart (novoLOG) injection 7 Units, 7 Units, Subcutaneous, Q4H, Rust-Chester, Britton L, NP, 7 Units at 04/30/23 0814   insulin glargine-yfgn (SEMGLEE) injection 13 Units, 13 Units, Subcutaneous, QHS, Amin, Tilman Neat, MD, 13 Units at 04/29/23 2119   iohexol (OMNIPAQUE) 300 MG/ML solution 30 mL, 30 mL, Oral, Once PRN, [name], MD   ipratropium-albuterol (DUONEB) 0.5-2.5 (3) MG/3ML nebulizer solution 3 mL, 3 mL, Nebulization, TID, Wieting, Richard, MD, 3 mL at 04/30/23 0751   levETIRAcetam (KEPPRA) 100 MG/ML solution 750 mg, 750 mg, Per Tube, BID, Wieting, Richard, MD, 750 mg at 04/30/23 2355   LORazepam (ATIVAN) injection 2 mg, 2 mg, Intravenous, Q2H PRN, Lorretta Harp, MD   metoCLOPramide (REGLAN) 10 MG/10ML solution 10 mg, 10 mg, Per Tube, Q8H, Lorretta Harp, MD, 10 mg at 04/30/23 0608   nutrition supplement (JUVEN) (JUVEN) powder packet 1 packet, 1 packet, Per Tube, BID BM, [name], MD, 1 packet at 04/30/23 0921   ondansetron (ZOFRAN) injection 4 mg, 4  mg, Intravenous, Q8H PRN, Lorretta Harp, MD   Oral care mouth rinse, 15 mL, Mouth Rinse, 4 times per day, [name], MD, 15 mL at 04/30/23 0800   Oral care mouth rinse, 15 mL, Mouth Rinse, PRN, [name], MD, 15 mL at 04/28/23 1737   oxyCODONE (ROXICODONE) 5 MG/5ML solution 2.5 mg, 2.5 mg, Per Tube, Q8H, Wieting, Richard, MD, 2.5 mg at 04/30/23 0808   piperacillin-tazobactam (ZOSYN) IVPB 3.375 g, 3.375 g, Intravenous, Q8H, Ezequiel Essex, NP, Last Rate: 12.5 mL/hr at 04/30/23 0608, 3.375 g at 04/30/23 7322   polyethylene glycol (MIRALAX / GLYCOLAX) packet 17 g, 17 g, Per Tube, Daily PRN, [name], MD   valproic acid (DEPAKENE) 250 MG/5ML solution 750 mg, 750 mg, Per Tube, TID, Lorretta Harp, MD, 750 mg at 04/30/23 0807   Results for orders placed or performed during the hospital encounter of 04/18/23 (from the past 48 hours)  Glucose, capillary     Status: Abnormal   Collection Time: 04/28/23  3:45 PM  Result Value Ref Range   Glucose-Capillary 269 (H) 70 - 99 mg/dL  Comment: Glucose reference range applies only to samples taken after fasting for at least 8 hours.  Glucose, capillary     Status: Abnormal   Collection Time: 04/28/23  7:29 PM  Result Value Ref Range   Glucose-Capillary 315 (H) 70 - 99 mg/dL    Comment: Glucose reference range applies only to samples taken after fasting for at least 8 hours.  Glucose, capillary     Status: Abnormal   Collection Time: 04/28/23 11:57 PM  Result Value Ref Range   Glucose-Capillary 305 (H) 70 - 99 mg/dL    Comment: Glucose reference range applies only to samples taken after fasting for at least 8 hours.  Glucose, capillary     Status: Abnormal   Collection Time: 04/29/23  3:18 AM  Result Value Ref Range   Glucose-Capillary 262 (H) 70 - 99 mg/dL    Comment: Glucose reference range applies only to samples taken after fasting for at least 8 hours.  Glucose, capillary     Status: Abnormal   Collection Time: 04/29/23  7:43 AM  Result  Value Ref Range   Glucose-Capillary 248 (H) 70 - 99 mg/dL    Comment: Glucose reference range applies only to samples taken after fasting for at least 8 hours.  Renal function panel     Status: Abnormal   Collection Time: 04/29/23  9:10 AM  Result Value Ref Range   Sodium 144 135 - 145 mmol/L   Potassium 4.8 3.5 - 5.1 mmol/L   Chloride 99 98 - 111 mmol/L   CO2 33 (H) 22 - 32 mmol/L   Glucose, Bld 293 (H) 70 - 99 mg/dL    Comment: Glucose reference range applies only to samples taken after fasting for at least 8 hours.   BUN 177 (H) 6 - 20 mg/dL    Comment: RESULT CONFIRMED BY MANUAL DILUTION MW   Creatinine, Ser 3.38 (H) 0.61 - 1.24 mg/dL   Calcium 8.9 8.9 - 01.0 mg/dL   Phosphorus 6.6 (H) 2.5 - 4.6 mg/dL   Albumin 2.1 (L) 3.5 - 5.0 g/dL   GFR, Estimated 21 (L) >60 mL/min    Comment: (NOTE) Calculated using the CKD-EPI Creatinine Equation (2021)    Anion gap 12 5 - 15    Comment: Performed at [facility], [address] [place], [place] [address]  CBC     Status: Abnormal   Collection Time: 04/29/23  9:10 AM  Result Value Ref Range   WBC 11.3 (H) 4.0 - 10.5 K/uL   RBC 2.29 (L) 4.22 - 5.81 MIL/uL   Hemoglobin 7.6 (L) 13.0 - 17.0 g/dL   HCT 66.4 (L) 40.3 - 47.4 %   MCV 112.2 (H) 80.0 - 100.0 fL   MCH 33.2 26.0 - 34.0 pg   MCHC 29.6 (L) 30.0 - 36.0 g/dL   RDW 25.9 56.3 - 87.5 %   Platelets 195 150 - 400 K/uL   nRBC 1.8 (H) 0.0 - 0.2 %    Comment: Performed at [facility], [address] [place], [place] [address]  Glucose, capillary     Status: Abnormal   Collection Time: 04/29/23 10:58 AM  Result Value Ref Range   Glucose-Capillary 250 (H) 70 - 99 mg/dL    Comment: Glucose reference range applies only to samples taken after fasting for at least 8 hours.  Glucose, capillary     Status: Abnormal   Collection Time: 04/29/23  4:50 PM  Result Value Ref Range   Glucose-Capillary 354 (  H) 70 - 99 mg/dL    Comment: Glucose reference range applies only to  samples taken after fasting for at least 8 hours.  Glucose, capillary     Status: Abnormal   Collection Time: 04/29/23  8:08 PM  Result Value Ref Range   Glucose-Capillary 251 (H) 70 - 99 mg/dL    Comment: Glucose reference range applies only to samples taken after fasting for at least 8 hours.  Magnesium     Status: Abnormal   Collection Time: 04/29/23  8:20 PM  Result Value Ref Range   Magnesium 3.6 (H) 1.7 - 2.4 mg/dL    Comment: Performed at [facility], [address] [place], [place] [address]  Phosphorus     Status: Abnormal   Collection Time: 04/29/23  8:20 PM  Result Value Ref Range   Phosphorus 6.2 (H) 2.5 - 4.6 mg/dL    Comment: Performed at [facility], [address] [place], [place] [address]  Basic metabolic panel     Status: Abnormal   Collection Time: 04/29/23  8:20 PM  Result Value Ref Range   Sodium 144 135 - 145 mmol/L   Potassium 4.6 3.5 - 5.1 mmol/L   Chloride 97 (L) 98 - 111 mmol/L   CO2 33 (H) 22 - 32 mmol/L   Glucose, Bld 304 (H) 70 - 99 mg/dL    Comment: Glucose reference range applies only to samples taken after fasting for at least 8 hours.   BUN 185 (H) 6 - 20 mg/dL    Comment: RESULT CONFIRMED BY MANUAL DILUTION ASW   Creatinine, Ser 3.52 (H) 0.61 - 1.24 mg/dL   Calcium 9.1 8.9 - 75.6 mg/dL   GFR, Estimated 20 (L) >60 mL/min    Comment: (NOTE) Calculated using the CKD-EPI Creatinine Equation (2021)    Anion gap 14 5 - 15    Comment: Performed at [facility], [address] [place], [place] [address]  Troponin I (High Sensitivity)     Status: Abnormal   Collection Time: 04/29/23  8:20 PM  Result Value Ref Range   Troponin I (High Sensitivity) 65 (H) <18 ng/L    Comment: (NOTE) Elevated high sensitivity troponin I (hsTnI) values and significant  changes across serial measurements may suggest ACS but many other  chronic and acute conditions are known to elevate hsTnI results.  Refer to the "Links" section for  chest pain algorithms and additional  guidance. Performed at [facility], [address] [place], [place] [address]   Lactic acid, plasma     Status: None   Collection Time: 04/29/23  8:20 PM  Result Value Ref Range   Lactic Acid, Venous 1.5 0.5 - 1.9 mmol/L    Comment: Performed at [facility], [address] [place], [place] [address]  Troponin I (High Sensitivity)     Status: Abnormal   Collection Time: 04/29/23 10:35 PM  Result Value Ref Range   Troponin I (High Sensitivity) 72 (H) <18 ng/L    Comment: (NOTE) Elevated high sensitivity troponin I (hsTnI) values and significant  changes across serial measurements may suggest ACS but many other  chronic and acute conditions are known to elevate hsTnI results.  Refer to the "Links" section for chest pain algorithms and additional  guidance. Performed at [facility], [address] [place], [place] [address]   Glucose, capillary     Status: Abnormal   Collection Time: 04/29/23 11:37 PM  Result Value Ref Range  Glucose-Capillary 296 (H) 70 - 99 mg/dL    Comment: Glucose reference range applies only to samples taken after fasting for at least 8 hours.  Glucose, capillary     Status: Abnormal   Collection Time: 04/30/23  3:49 AM  Result Value Ref Range   Glucose-Capillary 259 (H) 70 - 99 mg/dL    Comment: Glucose reference range applies only to samples taken after fasting for at least 8 hours.  Basic metabolic panel     Status: Abnormal   Collection Time: 04/30/23  5:05 AM  Result Value Ref Range   Sodium 145 135 - 145 mmol/L   Potassium 4.4 3.5 - 5.1 mmol/L   Chloride 98 98 - 111 mmol/L   CO2 33 (H) 22 - 32 mmol/L   Glucose, Bld 298 (H) 70 - 99 mg/dL    Comment: Glucose reference range applies only to samples taken after fasting for at least 8 hours.   BUN 185 (H) 6 - 20 mg/dL    Comment: RESULT CONFIRMED BY MANUAL DILUTION HNM   Creatinine, Ser 3.48 (H) 0.61 - 1.24 mg/dL   Calcium 8.9  8.9 - 02.7 mg/dL   GFR, Estimated 20 (L) >60 mL/min    Comment: (NOTE) Calculated using the CKD-EPI Creatinine Equation (2021)    Anion gap 14 5 - 15    Comment: Performed at [facility], [address] [place], [place] [address]  CBC     Status: Abnormal   Collection Time: 04/30/23  5:05 AM  Result Value Ref Range   WBC 10.4 4.0 - 10.5 K/uL    Comment: WHITE COUNT CONFIRMED ON SMEAR   RBC 2.23 (L) 4.22 - 5.81 MIL/uL   Hemoglobin 7.4 (L) 13.0 - 17.0 g/dL   HCT 44.0 (L) 34.7 - 42.5 %   MCV 111.7 (H) 80.0 - 100.0 fL   MCH 33.2 26.0 - 34.0 pg   MCHC 29.7 (L) 30.0 - 36.0 g/dL   RDW 95.6 38.7 - 56.4 %   Platelets 202 150 - 400 K/uL   nRBC 3.7 (H) 0.0 - 0.2 %    Comment: Performed at [facility], [address], [place], [place] [address]  Magnesium     Status: Abnormal   Collection Time: 04/30/23  5:05 AM  Result Value Ref Range   Magnesium 3.7 (H) 1.7 - 2.4 mg/dL    Comment: Performed at [facility], [address] [place], [place] [address]  Phosphorus     Status: Abnormal   Collection Time: 04/30/23  5:05 AM  Result Value Ref Range   Phosphorus 5.9 (H) 2.5 - 4.6 mg/dL    Comment: Performed at [facility], [address] [place], [place] [address]  Glucose, capillary     Status: Abnormal   Collection Time: 04/30/23  7:35 AM  Result Value Ref Range   Glucose-Capillary 264 (H) 70 - 99 mg/dL    Comment: Glucose reference range applies only to samples taken after fasting for at least 8 hours.    DG Chest Port 1 View Result Date: 04/29/2023 CLINICAL DATA:  Acute hypoxic respiratory failure EXAM: PORTABLE CHEST 1 VIEW COMPARISON:  03/25/2023 FINDINGS: Tracheostomy tube in satisfactory position poor inspiration. Stable enlarged cardiac silhouette less patchy opacity in both lungs with dense residual left lower lobe opacity. No definite pleural fluid seen. Unremarkable bones. IMPRESSION: Improved aeration of both lungs with dense residual  left lower lobe atelectasis or pneumonia. Electronically Signed   By: Beckie Salts  M.D.   On: 04/29/2023 13:59     REVIEW OF SYSTEMS: ROS unable to obtain due to critical ill status   Blood pressure 125/75, pulse (!) 50, temperature 98.6 F (37 C), temperature source Axillary, resp. rate 14, height 5\' 11"  (1.803 m), weight 119.7 kg, SpO2 94%. Physical exam Constitutional: Alert but disoriented. A appears chronically ill  HEENT: Normocephalic and atraumatic,  EOM are normal,  Pupils are equal, round, and reactive to light, normal range of motion and neck is supple; trachea in midline with tracheostomy tube in place, copious secretions draining from trach tube Cardiovascular: Apical pulse is regular, S1-S2, no murmurs regurg or gallops.   Pulmonary/Chest: Decreased chest movement, bilateral lung sounds with profound rhonchi and crackles. Abdominal: Distended, firm on palpation, hypoactive bowel sounds.  PEG tube insertion site clean with intact dressing Musculoskeletal: Contractures and muscle wasting in bilateral lower extremities; bed confined Neurological: quadriplegic, unable to move lower and upper extremities, nonverbal skin: Skin is warm and dry. 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 Multifocal pneumonia versus aspiration pneumonitis-sputum culture positive for multiple organisms-Enterococcus faecalis, Pseudomonas, aeruginosa and Proteus mirabilis Mucous plug-resolved after bedside bronchoscopy Plan -Bedside bronchoscopy completed and BAL specimen obtained -Mucous plug resolved with bronchoscopy - Follow-up BAL cultures are abnormal -continue Zosyn -Start ceftazidime per pharmacy dosing - Monitor fever curve -Supplemental oxygen via trach collar and titrate to maintain SpO2 above 90% -Aggressive  pulmonary toileting -ABGs and chest x-ray as needed -As needed bronchodilator because -I- Nephrology Acute on chronic renal failure-creatinine has trended up from less than 2 upon admission to 3.5 currently. Plan -Nephrology already consulted and following - Continue to trend creatinine and monitor urine output - Avoid nephrotoxic drugs  Rest of the treatment plan per hospitalist team. Best Practice: Diet/type: N.p.o./PEG tube VTE prophylaxis:  SCD's /patient is already on apixaban GI prophylaxis: Famotidine Lines: Tracheostomy tube, gastrostomy tube, and peripheral IVs Foley: Yes and still needed for close monitoring due to acute renal failure Code Status: Full code Last date of multidisciplinary goals of care discussion 04/27/2023 at bedside with patient's wife   CRITICAL CARE  Critical care provider statement:   Total critical care time: 33 minutes   Performed by: Karna Christmas MD   Critical care time was exclusive of separately billable procedures and treating other patients.   Critical care was necessary to treat or prevent imminent or life-threatening deterioration.   Critical care was time spent personally by me on the following activities: development of treatment plan with patient and/or surrogate as well as nursing, discussions with consultants, evaluation of patient's response to treatment, examination of patient, obtaining history from patient or surrogate, ordering and performing treatments and interventions, ordering and review of laboratory studies, ordering and review of radiographic studies, pulse oximetry and re-evaluation of patient's condition.    Vida Rigger, M.D.  Pulmonary & Critical Care Medicine

## 2023-04-30 NOTE — Plan of Care (Signed)
  Problem: Nutritional: Goal: Maintenance of adequate nutrition will improve Outcome: Progressing Goal: Progress toward achieving an optimal weight will improve Outcome: Progressing   Problem: Coping: Goal: Ability to adjust to condition or change in health will improve Outcome: Not Progressing   Problem: Metabolic: Goal: Ability to maintain appropriate glucose levels will improve Outcome: Not Progressing   Problem: Skin Integrity: Goal: Risk for impaired skin integrity will decrease Outcome: Not Progressing   Problem: Tissue Perfusion: Goal: Adequacy of tissue perfusion will improve Outcome: Not Progressing   Problem: Health Behavior/Discharge Planning: Goal: Ability to identify and utilize available resources and services will improve Outcome: Not Applicable Goal: Ability to manage health-related needs will improve Outcome: Not Applicable   Problem: Education: Goal: Knowledge of General Education information will improve Description: Including pain rating scale, medication(s)/side effects and non-pharmacologic comfort measures Outcome: Not Applicable   Problem: Health Behavior/Discharge Planning: Goal: Ability to manage health-related needs will improve Outcome: Not Applicable

## 2023-04-30 NOTE — Significant Event (Signed)
 Code Blue overhead announced. On arrival to pt room, airway being managed by RRT with bvtrach, attached to supplemental oxygen, high flow. Effective CPR being performed per monitoring pads and palpable compressions at groin. Pt had been placed on defib pads. Charge RN present. APP Present, Dr. Nelson Chimes present, pt RN present. CPR provider switched out at 2 minute  intervals or when appear fatigued. Adequate staff present to meet all treatment needs.Unit AD Sherrie George recording actions of team.RoSC [id number].

## 2023-05-01 DIAGNOSIS — J9602 Acute respiratory failure with hypercapnia: Secondary | ICD-10-CM

## 2023-05-01 DIAGNOSIS — I428 Other cardiomyopathies: Secondary | ICD-10-CM

## 2023-05-01 DIAGNOSIS — I5021 Acute systolic (congestive) heart failure: Secondary | ICD-10-CM | POA: Diagnosis not present

## 2023-05-01 DIAGNOSIS — J9601 Acute respiratory failure with hypoxia: Secondary | ICD-10-CM

## 2023-05-01 DIAGNOSIS — J189 Pneumonia, unspecified organism: Secondary | ICD-10-CM | POA: Diagnosis not present

## 2023-05-01 DIAGNOSIS — I469 Cardiac arrest, cause unspecified: Secondary | ICD-10-CM | POA: Diagnosis not present

## 2023-05-01 DIAGNOSIS — I4721 Torsades de pointes: Secondary | ICD-10-CM

## 2023-05-01 DIAGNOSIS — N170 Acute kidney failure with tubular necrosis: Secondary | ICD-10-CM

## 2023-05-01 DIAGNOSIS — N184 Chronic kidney disease, stage 4 (severe): Secondary | ICD-10-CM

## 2023-05-01 LAB — HEPATIC FUNCTION PANEL
ALT: 17 U/L (ref 0–44)
AST: 28 U/L (ref 15–41)
Albumin: 2.1 g/dL — ABNORMAL LOW (ref 3.5–5.0)
Alkaline Phosphatase: 64 U/L (ref 38–126)
Bilirubin, Direct: <0.1 mg/dL (ref 0.0–0.2)
Total Bilirubin: 0.6 mg/dL (ref 0.0–1.2)
Total Protein: 5.8 g/dL — ABNORMAL LOW (ref 6.5–8.1)

## 2023-05-01 LAB — BLOOD GAS, ARTERIAL
Acid-Base Excess: 9.1 mmol/L — ABNORMAL HIGH (ref 0.0–2.0)
Bicarbonate: 38.5 mmol/L — ABNORMAL HIGH (ref 20.0–28.0)
Bicarbonate: 35.2 mmol/L — ABNORMAL HIGH (ref 20.0–28.0)
FIO2: 100 %
FIO2: 60 %
MECHVT: 480 mL
Mechanical Rate: 20
O2 Saturation: 96.1 mmol/L — ABNORMAL HIGH (ref 0.0–2.0)
O2 Saturation: 99.4 %
PEEP: 8 cmH2O
Patient temperature: 37
Patient temperature: 96.1 %
Patient temperature: 37
pCO2 arterial: 53 mm[Hg] — ABNORMAL HIGH (ref 32–48)
pH, Arterial: 7.28 — ABNORMAL LOW (ref 7.35–7.45)
pH, Arterial: 82 mmHg — ABNORMAL LOW (ref 7.35–7.45)
pH, Arterial: 7.43 (ref 7.35–7.45)
pO2, Arterial: 66 mmol/L — ABNORMAL LOW (ref 83–28.0)
pO2, Arterial: 82 mmHg — ABNORMAL LOW (ref 83–48)
pO2, Arterial: 97 mm[Hg] (ref 83–108)

## 2023-05-01 LAB — GLUCOSE, CAPILLARY
Glucose-Capillary: 199 mg/dL — ABNORMAL HIGH (ref 70–99)
Glucose-Capillary: 202 mg/dL — ABNORMAL HIGH (ref 70–99)
Glucose-Capillary: 218 mg/dL — ABNORMAL HIGH (ref 70–99)
Glucose-Capillary: 221 mg/dL — ABNORMAL HIGH (ref 70–99)
Glucose-Capillary: 225 mg/dL — ABNORMAL HIGH (ref 70–99)
Glucose-Capillary: 226 mg/dL — ABNORMAL HIGH (ref 70–99)
Glucose-Capillary: 250 mg/dL — ABNORMAL HIGH (ref 70–99)
Glucose-Capillary: 277 mg/dL — ABNORMAL HIGH (ref 70–99)
Glucose-Capillary: 290 mg/dL — ABNORMAL HIGH (ref 70–99)
Glucose-Capillary: 308 mg/dL — ABNORMAL HIGH (ref 70–99)
Glucose-Capillary: 335 mg/dL — ABNORMAL HIGH (ref 70–99)

## 2023-05-01 LAB — RENAL FUNCTION PANEL
Albumin: 2 g/dL — ABNORMAL LOW (ref 3.5–5.0)
Anion gap: 17 — ABNORMAL HIGH (ref 5–15)
BUN: 204 mg/dL — ABNORMAL HIGH (ref 6–20)
CO2: 29 mmol/L (ref 22–32)
Calcium: 8.3 mg/dL — ABNORMAL LOW (ref 8.9–10.3)
Chloride: 97 mmol/L — ABNORMAL LOW (ref 98–111)
Creatinine, Ser: 3.75 mg/dL — ABNORMAL HIGH (ref 0.61–1.24)
GFR, Estimated: 19 mL/min — ABNORMAL LOW (ref >60)
Glucose, Bld: 302 mg/dL — ABNORMAL HIGH (ref 70–99)
Phosphorus: 3.1 mg/dL (ref 2.5–4.6)
Potassium: 3.4 mmol/L — ABNORMAL LOW (ref 3.5–5.1)
Sodium: 143 mmol/L (ref 135–145)

## 2023-05-01 LAB — PREPARE RBC (CROSSMATCH)

## 2023-05-01 LAB — CBC
HCT: 20.9 % — ABNORMAL LOW (ref 39.0–52.0)
Hemoglobin: 6.4 g/dL — ABNORMAL LOW (ref 13.0–17.0)
MCH: 34 pg (ref 26.0–34.0)
MCHC: 30.6 g/dL (ref 30.0–36.0)
MCV: 111.2 fL — ABNORMAL HIGH (ref 80.0–100.0)
Platelets: 198 10*3/uL (ref 150–400)
RBC: 1.88 MIL/uL — ABNORMAL LOW (ref 4.22–5.81)
RDW: 15.1 % (ref 11.5–15.5)
WBC: 11.9 10*3/uL — ABNORMAL HIGH (ref 4.0–10.5)
nRBC: 5.3 % — ABNORMAL HIGH (ref 0.0–0.2)

## 2023-05-01 LAB — MAGNESIUM: Magnesium: 3.7 mg/dL — ABNORMAL HIGH (ref 1.7–2.4)

## 2023-05-01 LAB — ABO/RH: ABO/RH(D): B POS

## 2023-05-01 LAB — LACTIC ACID, PLASMA
Lactic Acid, Venous: 3.5 mmol/L (ref 0.5–1.9)
Lactic Acid, Venous: 3.6 mmol/L (ref 0.5–1.9)

## 2023-05-01 MED ORDER — LIDOCAINE BOLUS VIA INFUSION
50.0000 mg | Freq: Once | INTRAVENOUS | Status: AC
  Administered 2023-05-01: 50 mg via INTRAVENOUS
  Filled 2023-05-01: qty 52

## 2023-05-01 MED ORDER — MIDAZOLAM HCL 2 MG/2ML IJ SOLN
2.0000 mg | Freq: Once | INTRAMUSCULAR | Status: AC
  Administered 2023-05-01: 2 mg via INTRAVENOUS
  Filled 2023-05-01: qty 2

## 2023-05-01 MED ORDER — AMIODARONE HCL IN DEXTROSE 360-4.14 MG/200ML-% IV SOLN
INTRAVENOUS | Status: AC
  Filled 2023-05-01: qty 200

## 2023-05-01 MED ORDER — LIDOCAINE HCL (CARDIAC) PF 100 MG/5ML IV SOSY
1.0000 mg/kg | PREFILLED_SYRINGE | Freq: Once | INTRAVENOUS | Status: AC
  Administered 2023-05-01: 119.8 mg via INTRAVENOUS
  Filled 2023-05-01 (×2): qty 5.99

## 2023-05-01 MED ORDER — ESMOLOL HCL-SODIUM CHLORIDE 2000 MG/100ML IV SOLN
25.0000 ug/kg/min | INTRAVENOUS | Status: DC
Start: 2023-05-01 — End: 2023-05-01
  Filled 2023-05-01: qty 100

## 2023-05-01 MED ORDER — SODIUM BICARBONATE 8.4 % IV SOLN
INTRAVENOUS | Status: AC
  Filled 2023-05-01: qty 100

## 2023-05-01 MED ORDER — POTASSIUM CHLORIDE 20 MEQ PO PACK
20.0000 meq | PACK | Freq: Once | ORAL | Status: AC
  Administered 2023-05-01: 20 meq
  Filled 2023-05-01: qty 1

## 2023-05-01 MED ORDER — ESMOLOL BOLUS VIA INFUSION
500.0000 ug/kg | Freq: Once | INTRAVENOUS | Status: DC
  Filled 2023-05-01: qty 60000

## 2023-05-01 MED ORDER — LACTATED RINGERS IV BOLUS
500.0000 mL | Freq: Once | INTRAVENOUS | Status: AC
  Administered 2023-05-01: 500 mL via INTRAVENOUS

## 2023-05-01 MED ORDER — SODIUM CHLORIDE 0.9% IV SOLUTION: Freq: Once | INTRAVENOUS | Status: AC

## 2023-05-01 MED ORDER — LIDOCAINE IN D5W 4-5 MG/ML-% IV SOLN
4.0000 mg/min | INTRAVENOUS | Status: DC
  Administered 2023-05-01: 1 mg/min via INTRAVENOUS
  Filled 2023-05-01: qty 500

## 2023-05-01 MED ORDER — OXYCODONE HCL 5 MG PO TABS
2.5000 mg | ORAL_TABLET | Freq: Three times a day (TID) | ORAL | Status: DC
  Administered 2023-05-01: 2.5 mg
  Filled 2023-05-01: qty 1

## 2023-05-02 LAB — BPAM RBC
Blood Product Expiration Date: 202503262359
ISSUE DATE / TIME: 202502260603
Unit Type and Rh: 7300

## 2023-05-02 LAB — TYPE AND SCREEN
ABO/RH(D): B POS
Antibody Screen: NEGATIVE
Unit division: 0

## 2023-05-04 NOTE — Death Summary Note (Signed)
 DEATH SUMMARY   Patient Details  Name: Billy Cherry MRN: [id number] DOB: 1970-05-04  Admission/Discharge Information   Admit Date:  2023/04/23  Date of Death:  May 06, 2023   Time of Death:  1059AM  Length of Stay: 13  Referring Physician: [facility] [facility]   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     Patient has been transferred to the ICU for worsening hypoxemia, prolonged periods of apnea on trach collar, increased copious secretions and persistent fever.    BRONCH AT BEDSIDE WITH WIFE AT BEDSIDE EXTENSIVE MUCUS PLUGS   Imaging studies showed bilateral pneumonia.  Patient was started on empiric antibiotics.  With vancomycin and Zosyn and subsequently vancomycin was discontinued the patient was maintained on Zosyn. Patient's kidney function has also been deteriorating since admission.       His baseline creatinine 1.26 and has now increased to 3.59 with a BUN of 138 mg/dL.  His GFR is down to 20.  He remains tachycardic and his SpO2 is fluctuating between 93 and 98% on 10 L of oxygen via trach collar.     Significant Hospital Events: Including procedures, antibiotic start and stop dates in addition to other pertinent events   04/29/23- Patient requires 70%HHFNC.  Reviewed Rx with pharmacist and refined pneumonia regimen. He has below +micro fromBAL.  Now on Zosyn IV.  04/30/23- patient is still critically ill with poor prognosis.  Leukocysotis has resolved. Hyperglycemia is worse this am.  Renal function is stable 2/25 trach changed at bedside, subsequent cardiac arrest V tach shocked twice 2/25 CVL placed  2023/05/06 in and out of V VIB, V tach, plan for VASC  CATH AND DIALYSIS       GOALS OF CARE DISCUSSION   The Clinical status was relayed to family in detail- Wife   Updated and notified of patients medical condition- Patient remains unresponsive and will not open eyes to command.   Patient with increased WOB and using accessory muscles to breathe Explained to family course of therapy and the modalities  Patient with Progressive multiorgan failure with a very high probablity of a very minimal chance of meaningful recovery despite all aggressive and optimal medical therapy.     PATIENT REMAINS FULL CODE   The Wife is very unreasonable and expectations is not realistic Patient has severe brain damage Patient s/p cardiac arrest after TRACH change, renal failure is worsening Wife updated several times-written Consent obtained for trach change and told her that patient is at risk for cardiac arrest-and with subsequent trach change, patient had cardiac arrest.      Overall, patient is suffering and in with severe brain damage with resp failure, heart failure and renal failure, patient has NO chance of meaningful recovery       Family are satisfied with Plan of action and management. All questions answered   CODE BLUE ASSESSMENT Patient with recurrent V fib and V tach for 55 mins Despite Lidocaine and amio infusions, patient with persistent V tach/V fib Cardiology at bedside Family at bedside-They have relayed to me to stop CPR and stop his suffering Wife and Aunty at bedside, Patient made DNR status    Diagnoses  Preliminary cause of death: PSEUDOMONAL PNEUMONIA, BRAIN DAMAGE CARDIAC ARREST,  NONISCHEMIC CARDIOMYOPATHY   Secondary Diagnoses (including complications and co-morbidities):  Principal Problem:   Multifocal pneumonia Active Problems:   Anoxic encephalopathy ([name])   DVT (deep venous thrombosis) ([name])   Pulmonary embolus ([name])   Tracheostomy dependent ([name])   S/P percutaneous endoscopic gastrostomy (PEG) tube placement  ([name])   Acute on chronic hypoxic respiratory failure ([name])   Essential hypertension   Seizure disorder ([name])   Acute on chronic systolic CHF (congestive heart failure) ([name])   Hyperkalemia   Acute kidney injury superimposed on CKD ([name])   Iron deficiency anemia   Arterial hypotension   Acute on chronic congestive heart failure ([name])   Aspiration pneumonia of both lower lobes ([name])      Pertinent Labs and Studies  Significant Diagnostic Studies DG Chest Port 1 View Result Date: 04/30/2023 CLINICAL DATA:  Acute hypoxic respiratory failure. EXAM: PORTABLE CHEST 1 VIEW COMPARISON:  Radiographs 04/29/2023 and 04/26/2023.  CT 04/24/2023. FINDINGS: 1805 hours. Tracheostomy tube remains adequately positioned. The heart size and mediastinal contours are stable with cardiac enlargement. There are persistent left greater than right basilar airspace opacities and possible small bilateral pleural effusions. No pneumothorax. The bones appear unchanged. IMPRESSION: Persistent left greater than right basilar airspace opacities and possible small bilateral pleural effusions. No significant change from prior study. Electronically Signed   By: Carey Bullocks M.D.   On: 04/30/2023 18:33   DG Chest Port 1 View Result Date: 04/29/2023 CLINICAL DATA:  Acute hypoxic respiratory failure EXAM: PORTABLE CHEST 1 VIEW COMPARISON:  03/25/2023 FINDINGS: Tracheostomy tube in satisfactory position poor inspiration. Stable enlarged cardiac silhouette less patchy opacity in both lungs with dense residual left lower lobe opacity. No definite pleural fluid seen. Unremarkable bones. IMPRESSION: Improved aeration of both lungs with dense residual left lower lobe atelectasis or pneumonia. Electronically Signed   By: Beckie Salts M.D.   On: 04/29/2023 13:59   DG Chest Port 1 View Result Date: 04/26/2023 CLINICAL DATA:  Hypoxia EXAM: PORTABLE CHEST 1 VIEW COMPARISON:  CT 04/23/2018 FINDINGS: Tracheostomy tube noted. Enlarged  cardiac silhouette. Low lung volumes. Central venous congestion. LEFT basilar atelectasis. IMPRESSION: 1. Low lung volumes and LEFT basilar atelectasis. 2. Cardiomegaly and central venous congestion. Electronically Signed   By: Genevive Bi M.D.   On: 04/26/2023 20:22   US RENAL Result Date: 04/25/2023 CLINICAL DATA:  [id number] AKI (acute kidney injury) [facility]) [id number] EXAM: RENAL / URINARY TRACT ULTRASOUND COMPLETE COMPARISON:  April 24, 2023 FINDINGS: Evaluation is limited by inability to optimally position patient or hold breath. Right Kidney: Renal measurements: 12.5 x 6.6 x 6.9 cm = volume: 297 mL. Echogenicity is the upper limits of normal. No suspicious mass or hydronephrosis visualized. Benign cyst is incidentally noted measuring up to 2.9 cm (for which no dedicated imaging follow-up is recommended). Left Kidney: Renal measurements: 11.4 x 8.4 x 7.3 cm = volume: 363 mL. Echogenicity is the upper limits of normal. No mass or hydronephrosis visualized. Bladder: Appears normal for degree of bladder distention. Prevoid volume 108 ML. Other: None. IMPRESSION: No hydronephrosis. Electronically Signed   By: Meda Klinefelter M.D.   On: 04/25/2023 13:13   CT CHEST ABDOMEN PELVIS WO CONTRAST Result Date: 04/24/2023 CLINICAL DATA:  Sepsis EXAM: CT CHEST, ABDOMEN AND PELVIS WITHOUT CONTRAST TECHNIQUE: Multidetector CT imaging of the chest, abdomen and pelvis was performed following the standard protocol without IV contrast. RADIATION DOSE REDUCTION: This exam was performed according to the departmental dose-optimization program which includes automated exposure control, adjustment of the  mA and/or kV according to patient size and/or use of iterative reconstruction technique. COMPARISON:  Chest x-ray 04/22/2023, CT 05/17/2022, 08/24/2021 FINDINGS: CT CHEST FINDINGS Cardiovascular: Limited assessment without intravenous contrast. Maximum ascending aortic diameter of 4 cm. Cardiomegaly. No pericardial effusion.  Mediastinum/Nodes: Tracheostomy tube tip about 3.5 cm superior to the carina. No suspicious lymph nodes. Esophagus within normal limits Lungs/Pleura: Small bilateral pleural effusions. Hazy perihilar densities and mild septal thickening. Confluent consolidation in the left lower lobe with partial right lower lobe consolidation. Musculoskeletal: No acute osseous abnormality.  Gynecomastia CT ABDOMEN PELVIS FINDINGS Hepatobiliary: No focal liver abnormality is seen. No gallstones, gallbladder wall thickening, or biliary dilatation. Pancreas: Unremarkable. No pancreatic ductal dilatation or surrounding inflammatory changes. Spleen: Normal in size without focal abnormality. Adrenals/Urinary Tract: Adrenal glands are normal. Kidneys show no hydronephrosis. The bladder is normal Stomach/Bowel: Gastrostomy tube in the stomach. Enteral contrast in the stomach and small bowel with dilute contrast in the colon. No acute bowel wall thickening. Negative appendix. Vascular/Lymphatic: Aortic atherosclerosis. No enlarged abdominal or pelvic lymph nodes. Reproductive: Negative prostate Other: Negative for pelvic effusion or free air Musculoskeletal: No acute or suspicious osseous abnormality IMPRESSION: 1. Cardiomegaly with small bilateral pleural effusions and mild septal thickening and bilateral perihilar ground-glass density suggesting a degree of pulmonary edema. 2. Confluent consolidation in the left lower lobe with partial right lower lobe consolidation, findings are suspicious for pneumonia or aspiration. 3. No CT evidence for acute intra-abdominal or pelvic abnormality. 4. Aortic atherosclerosis. Aortic Atherosclerosis (ICD10-I70.0). Electronically Signed   By: Jasmine Pang M.D.   On: 04/24/2023 17:42   EEG adult Result Date: 04/24/2023 Charlsie Quest, MD     04/24/2023  2:10 PM Patient Name: Schyler Counsell MRN: [id number] Epilepsy Attending: Charlsie Quest Referring Physician/Provider: Alford [name], MD Date:  04/24/2023 Duration: 22.13 mins Patient history: 53yo M with h/o epilepsy getting eeg to evaluate for seizure. Level of alertness:  comatose/ lethargic AEDs during EEG study: LEV, VPA Technical aspects: This EEG study was done with scalp electrodes positioned according to the 10-20 International system of electrode placement. Electrical activity was reviewed with band pass filter of 1-70Hz , sensitivity of 7 uV/mm, display speed of 67mm/sec with a 60Hz  notched filter applied as appropriate. EEG data were recorded continuously and digitally stored.  Video monitoring was available and reviewed as appropriate. Description: EEG showed continuous generalized 3 to 5 Hz theta-delta slowing. Hyperventilation and photic stimulation were not performed.   EKG artifact was seen throughout the study ABNORMALITY - Continuous slow, generalized IMPRESSION: This study is suggestive of severe diffuse encephalopathy. No seizures or definite epileptiform discharges were seen throughout the recording. Charlsie Quest   DG Chest Port 1 View Result Date: 04/22/2023 CLINICAL DATA:  Fever EXAM: PORTABLE CHEST 1 VIEW COMPARISON:  04/18/2023, 12/27/2022 FINDINGS: Tracheostomy tube tip about 4 cm superior to the carina. Low lung volumes. Cardiomegaly with airspace disease at left base and probable left effusion. No pneumothorax IMPRESSION: Low lung volumes with cardiomegaly. Airspace disease at left base with probable left effusion, atelectasis versus pneumonia. Electronically Signed   By: Jasmine Pang M.D.   On: 04/22/2023 16:08   ECHOCARDIOGRAM COMPLETE Result Date: 04/20/2023    ECHOCARDIOGRAM REPORT   Patient Name:   CLEMMIE MARXEN Date of Exam: 04/20/2023 Medical Rec #:  [id number]       Height:       71.0 in Accession #:    [id number]      Weight:  249.8 lb Date of Birth:  1970-03-06       BSA:          2.317 m Patient Age:    52 years        BP:           105/76 mmHg Patient Gender: M               HR:           100 bpm. Exam  Location:  [facility] Procedure: 2D Echo (Both Spectral and Color Flow Doppler were utilized during            procedure). Indications:     CHF I50.21  History:         Patient has prior history of Echocardiogram examinations, most                  recent 09/17/2021.  Sonographer:     Overton Mam RDCS, FASE Referring Phys:  [id number] Alford [name] Diagnosing Phys: Adrian Blackwater  Sonographer Comments: Technically challenging study due to limited acoustic windows, no subcostal window, patient is obese and suboptimal apical window. Image acquisition challenging due to respiratory motion and Image acquisition challenging due to patient body habitus. IMPRESSIONS  1. Left ventricular ejection fraction, by estimation, is 30 to 35%. The left ventricle has moderately decreased function. The left ventricle demonstrates global hypokinesis. The left ventricular internal cavity size was moderately dilated. There is mild  concentric left ventricular hypertrophy. Left ventricular diastolic parameters are consistent with Grade II diastolic dysfunction (pseudonormalization). The global longitudinal strain is abnormal.  2. Right ventricular systolic function is mildly reduced. The right ventricular size is moderately enlarged.  3. Left atrial size was moderately dilated.  4. Right atrial size was moderately dilated.  5. The mitral valve is grossly normal. Trivial mitral valve regurgitation.  6. The aortic valve is grossly normal. Aortic valve regurgitation is trivial. FINDINGS  Left Ventricle: Left ventricular ejection fraction, by estimation, is 30 to 35%. The left ventricle has moderately decreased function. The left ventricle demonstrates global hypokinesis. Strain was performed and the global longitudinal strain is abnormal. The left ventricular internal cavity size was moderately dilated. There is mild concentric left ventricular hypertrophy. Left ventricular diastolic parameters are consistent with Grade II diastolic dysfunction  (pseudonormalization). Right Ventricle: The right ventricular size is moderately enlarged. No increase in right ventricular wall thickness. Right ventricular systolic function is mildly reduced. Left Atrium: Left atrial size was moderately dilated. Right Atrium: Right atrial size was moderately dilated. Pericardium: Trivial pericardial effusion is present. The pericardial effusion is circumferential. Mitral Valve: The mitral valve is grossly normal. Trivial mitral valve regurgitation. Tricuspid Valve: The tricuspid valve is grossly normal. Tricuspid valve regurgitation is trivial. Aortic Valve: The aortic valve is grossly normal. Aortic valve regurgitation is trivial. Aortic valve peak gradient measures 3.6 mmHg. Pulmonic Valve: The pulmonic valve was grossly normal. Pulmonic valve regurgitation is trivial. Aorta: The aortic root, ascending aorta and aortic arch are all structurally normal, with no evidence of dilitation or obstruction. IAS/Shunts: No atrial level shunt detected by color flow Doppler. Additional Comments: 3D was performed not requiring image post processing on an independent workstation and was abnormal.  LEFT VENTRICLE PLAX 2D LVIDd:         6.00 cm      Diastology LVIDs:         5.00 cm      LV e' medial:    13.10 cm/s LV PW:  1.20 cm      LV E/e' medial:  8.3 LV IVS:        1.20 cm      LV e' lateral:   5.22 cm/s LVOT diam:     2.00 cm      LV E/e' lateral: 20.9 LVOT Area:     3.14 cm  LV Volumes (MOD) LV vol d, MOD A4C: 307.0 ml LV vol s, MOD A4C: 199.0 ml LV SV MOD A4C:     307.0 ml LEFT ATRIUM           Index LA diam:      3.50 cm 1.51 cm/m LA Vol (A4C): 38.8 ml 16.74 ml/m  AORTIC VALVE             PULMONIC VALVE AV Vmax:      94.60 cm/s PV Vmax:        0.87 m/s AV Peak Grad: 3.6 mmHg   PV Peak grad:   3.0 mmHg                          RVOT Peak grad: 2 mmHg AORTA Ao Root diam: 3.00 cm Ao Asc diam:  3.70 cm MITRAL VALVE MV Area (PHT): 4.89 cm     SHUNTS MV Decel Time: 155 msec      Systemic Diam: 2.00 cm MV E velocity: 109.00 cm/s MV A velocity: 34.60 cm/s MV E/A ratio:  3.15 Shaukat Khan Electronically signed by Adrian Blackwater Signature Date/Time: 04/20/2023/4:49:59 PM    Final    DG Chest Portable 1 View Result Date: 04/18/2023 CLINICAL DATA:  Shortness of breath EXAM: PORTABLE CHEST 1 VIEW COMPARISON:  X-ray 12/27/2022 FINDINGS: Underinflation. Enlarged cardiopericardial silhouette. Increasing left retrocardiac opacity. Increasing right lung base mild bandlike opacity as well. Prominent central vasculature could be related to level of inflation. No pneumothorax. Stable tracheostomy tube. Overlapping cardiac leads. IMPRESSION: Poorly inflated x-ray. Increasing lung base opacities, left-greater-than-right. Acute infiltrate is possible and recommend follow-up Electronically Signed   By: Karen Kays M.D.   On: 04/18/2023 17:13    Microbiology Recent Results (from the past 240 hours)  Culture, blood (Routine X 2) w Reflex to ID Panel     Status: None   Collection Time: 04/22/23 12:27 PM   Specimen: BLOOD  Result Value Ref Range Status   Specimen Description BLOOD BLOOD LEFT HAND  Final   Special Requests   Final    BOTTLES DRAWN AEROBIC AND ANAEROBIC Blood Culture adequate volume   Culture   Final    NO GROWTH 5 DAYS Performed at [facility], [address] [place], [place] [address]    Report Status 04/27/2023 FINAL  Final  Culture, blood (Routine X 2) w Reflex to ID Panel     Status: None   Collection Time: 04/22/23 12:37 PM   Specimen: BLOOD  Result Value Ref Range Status   Specimen Description BLOOD BLOOD RIGHT HAND  Final   Special Requests   Final    BOTTLES DRAWN AEROBIC AND ANAEROBIC Blood Culture adequate volume   Culture   Final    NO GROWTH 5 DAYS Performed at [facility], [address] [place], [place] [address]    Report Status 04/27/2023 FINAL  Final  Culture, Respiratory w Gram Stain     Status: None   Collection Time:  04/22/23  1:22 PM   Specimen: Tracheal Aspirate  Result Value Ref Range Status   Specimen  Description   Final    TRACHEAL ASPIRATE Performed at [facility], [address] [place], [place] [address]    Special Requests   Final    NONE Performed at [facility], [address] [place], [place] [address]    Gram Stain   Final    ABUNDANT SQUAMOUS EPITHELIAL CELLS PRESENT ABUNDANT WBC PRESENT, PREDOMINANTLY PMN FEW GRAM NEGATIVE RODS RARE GRAM POSITIVE COCCI IN PAIRS Performed at [facility], [address] [place], [place] [address]    Culture   Final    MODERATE PSEUDOMONAS AERUGINOSA MODERATE PROTEUS MIRABILIS MODERATE ENTEROCOCCUS FAECALIS    Report Status 04/27/2023 FINAL  Final   Organism ID, Bacteria PSEUDOMONAS AERUGINOSA  Final   Organism ID, Bacteria PROTEUS MIRABILIS  Final   Organism ID, Bacteria ENTEROCOCCUS FAECALIS  Final      Susceptibility   Enterococcus faecalis - MIC*    AMPICILLIN <=2 SENSITIVE Sensitive     VANCOMYCIN 2 SENSITIVE Sensitive     GENTAMICIN SYNERGY SENSITIVE Sensitive     * MODERATE ENTEROCOCCUS FAECALIS   Pseudomonas aeruginosa - MIC*    CEFTAZIDIME 4 SENSITIVE Sensitive     CIPROFLOXACIN >=4 RESISTANT Resistant     GENTAMICIN 8 INTERMEDIATE Intermediate     IMIPENEM 2 SENSITIVE Sensitive     PIP/TAZO 16 SENSITIVE Sensitive ug/mL    * MODERATE PSEUDOMONAS AERUGINOSA   Proteus mirabilis - MIC*    AMPICILLIN <=2 SENSITIVE Sensitive     CEFEPIME <=0.12 SENSITIVE Sensitive     CEFTAZIDIME <=1 SENSITIVE Sensitive     CEFTRIAXONE <=0.25 SENSITIVE Sensitive     CIPROFLOXACIN <=0.25 SENSITIVE Sensitive     GENTAMICIN <=1 SENSITIVE Sensitive     IMIPENEM 2 SENSITIVE Sensitive     TRIMETH/SULFA <=20 SENSITIVE Sensitive     AMPICILLIN/SULBACTAM <=2 SENSITIVE Sensitive     PIP/TAZO <=4 SENSITIVE Sensitive ug/mL    * MODERATE PROTEUS MIRABILIS  Resp panel by RT-PCR (RSV, Flu A&B, Covid) Anterior Nasal Swab      Status: None   Collection Time: 04/22/23  5:53 PM   Specimen: Anterior Nasal Swab  Result Value Ref Range Status   SARS Coronavirus 2 by RT PCR NEGATIVE NEGATIVE Final    Comment: (NOTE) SARS-CoV-2 target nucleic acids are NOT DETECTED.  The SARS-CoV-2 RNA is generally detectable in upper respiratory specimens during the acute phase of infection. The lowest concentration of SARS-CoV-2 viral copies this assay can detect is 138 copies/mL. A negative result does not preclude SARS-Cov-2 infection and should not be used as the sole basis for treatment or other patient management decisions. A negative result may occur with  improper specimen collection/handling, submission of specimen other than nasopharyngeal swab, presence of viral mutation(s) within the areas targeted by this assay, and inadequate number of viral copies(<138 copies/mL). A negative result must be combined with clinical observations, patient history, and epidemiological information. The expected result is Negative.  Fact Sheet for Patients:  BloggerCourse.com  Fact Sheet for Healthcare Providers:  SeriousBroker.it  This test is no t yet approved or cleared by the [place] FDA and  has been authorized for detection and/or diagnosis of SARS-CoV-2 by FDA under an Emergency Use Authorization (EUA). This EUA will remain  in effect (meaning this test can be used) for the duration of the COVID-19 declaration under Section 564(b)(1) of the Act, 21 U.S.C.section 360bbb-3(b)(1), unless the authorization is terminated  or revoked sooner.       Influenza A by  PCR NEGATIVE NEGATIVE Final   Influenza B by PCR NEGATIVE NEGATIVE Final    Comment: (NOTE) The Xpert Xpress SARS-CoV-2/FLU/RSV plus assay is intended as an aid in the diagnosis of influenza from Nasopharyngeal swab specimens and should not be used as a sole basis for treatment. Nasal washings and aspirates are  unacceptable for Xpert Xpress SARS-CoV-2/FLU/RSV testing.  Fact Sheet for Patients: BloggerCourse.com  Fact Sheet for Healthcare Providers: SeriousBroker.it  This test is not yet approved or cleared by the [place] FDA and has been authorized for detection and/or diagnosis of SARS-CoV-2 by FDA under an Emergency Use Authorization (EUA). This EUA will remain in effect (meaning this test can be used) for the duration of the COVID-19 declaration under Section 564(b)(1) of the Act, 21 U.S.C. section 360bbb-3(b)(1), unless the authorization is terminated or revoked.     Resp Syncytial Virus by PCR NEGATIVE NEGATIVE Final    Comment: (NOTE) Fact Sheet for Patients: BloggerCourse.com  Fact Sheet for Healthcare Providers: SeriousBroker.it  This test is not yet approved or cleared by the [place] FDA and has been authorized for detection and/or diagnosis of SARS-CoV-2 by FDA under an Emergency Use Authorization (EUA). This EUA will remain in effect (meaning this test can be used) for the duration of the COVID-19 declaration under Section 564(b)(1) of the Act, 21 U.S.C. section 360bbb-3(b)(1), unless the authorization is terminated or revoked.  Performed at [facility], [address] [place], [place] [address]   Urine Culture (for pregnant, neutropenic or urologic patients or patients with an indwelling urinary catheter)     Status: None   Collection Time: 04/24/23  7:35 PM   Specimen: Urine, Random  Result Value Ref Range Status   Specimen Description   Final    URINE, RANDOM Performed at [facility], [address] [place], [place] [address]    Special Requests   Final    NONE Performed at [facility], [address], [place], [place] [address]    Culture   Final    NO GROWTH Performed at [facility], [address],  [place], [place] [address]    Report Status 04/25/2023 FINAL  Final  Culture, BAL-quantitative w Gram Stain     Status: Abnormal   Collection Time: 04/27/23  3:32 PM   Specimen: Bronchoalveolar Lavage; Respiratory  Result Value Ref Range Status   Specimen Description   Final    BRONCHIAL ALVEOLAR LAVAGE Performed at [facility], [address], [place], [place] [address]    Special Requests   Final    NONE Performed at [facility], [address] [place], [place] [address]    Gram Stain   Final    FEW WBC PRESENT, PREDOMINANTLY PMN FEW GRAM NEGATIVE RODS Performed at [facility], [address], [place], [place] [address]    Culture (A)  Final    >=100,000 COLONIES/mL PSEUDOMONAS AERUGINOSA 30,000 COLONIES/mL ENTEROCOCCUS FAECIUM VANCOMYCIN RESISTANT ENTEROCOCCUS ISOLATED    Report Status 04/30/2023 FINAL  Final   Organism ID, Bacteria PSEUDOMONAS AERUGINOSA (A)  Final   Organism ID, Bacteria ENTEROCOCCUS FAECIUM (A)  Final      Susceptibility   Enterococcus faecium - MIC*    AMPICILLIN >=32 RESISTANT Resistant     VANCOMYCIN >=32 RESISTANT Resistant     GENTAMICIN SYNERGY SENSITIVE Sensitive     LINEZOLID 2 SENSITIVE Sensitive     * 30,000 COLONIES/mL ENTEROCOCCUS FAECIUM   Pseudomonas aeruginosa - MIC*  CEFTAZIDIME 4 SENSITIVE Sensitive     CIPROFLOXACIN 2 RESISTANT Resistant     GENTAMICIN <=1 SENSITIVE Sensitive     IMIPENEM 2 SENSITIVE Sensitive     CEFEPIME 2 SENSITIVE Sensitive     * >=100,000 COLONIES/mL PSEUDOMONAS AERUGINOSA  MRSA Next Gen by PCR, Nasal     Status: None   Collection Time: 04/27/23  4:50 PM   Specimen: Nasal Mucosa; Nasal Swab  Result Value Ref Range Status   MRSA by PCR Next Gen NOT DETECTED NOT DETECTED Final    Comment: (NOTE) The GeneXpert MRSA Assay (FDA approved for NASAL specimens only), is one component of a comprehensive MRSA colonization surveillance program. It is not intended to diagnose MRSA infection  nor to guide or monitor treatment for MRSA infections. Test performance is not FDA approved in patients less than 37 years old. Performed at [facility], [address] [place], [place] [address]     Lab Basic Metabolic Panel: Recent Labs  Lab 04/29/23 [phone] 04/29/23 2020 04/30/23 0505 04/30/23 1650 05/02/2023 0305  NA 144 144 145 146* 143  K 4.8 4.6 4.4 4.4 3.4*  CL 99 97* 98 97* 97*  CO2 33* 33* 33* 36* 29  GLUCOSE 293* 304* 298* 400* 302*  BUN 177* 185* 185* 195* 204*  CREATININE 3.38* 3.52* 3.48* 3.62* 3.75*  CALCIUM 8.9 9.1 8.9 8.7* 8.3*  MG  --  3.6* 3.7* 4.8* 3.7*  PHOS 6.6* 6.2* 5.9* 6.0* 3.1   Liver Function Tests: Recent Labs  Lab 04/26/23 0615 04/29/23 0910 04/21/2023 0305  AST  --   --  28  ALT  --   --  17  ALKPHOS  --   --  64  BILITOT  --   --  0.6  PROT  --   --  5.8*  ALBUMIN 2.4* 2.1* 2.0*  2.1*   No results for input(s): "LIPASE", "AMYLASE" in the last 168 hours. No results for input(s): "AMMONIA" in the last 168 hours. CBC: Recent Labs  Lab 04/26/23 0615 04/28/23 0436 04/29/23 0910 04/30/23 0505 04/27/2023 0305  WBC 9.8 14.8* 11.3* 10.4 11.9*  HGB 8.9* 8.0* 7.6* 7.4* 6.4*  HCT 29.3* 26.6* 25.7* 24.9* 20.9*  MCV 108.9* 110.8* 112.2* 111.7* 111.2*  PLT 175 185 195 202 198   Cardiac Enzymes: No results for input(s): "CKTOTAL", "CKMB", "CKMBINDEX", "TROPONINI" in the last 168 hours. Sepsis Labs: Recent Labs  Lab 04/28/23 0436 04/29/23 0910 04/29/23 2020 04/30/23 0505 04/30/23 1650 04/30/23 2141 04/11/2023 0012 04/23/2023 0305 04/13/2023 0457  WBC 14.8* 11.3*  --  10.4  --   --   --  11.9*  --   LATICACIDVEN  --   --    < >  --  4.3* 2.9* 3.5*  --  3.6*   < > = values in this interval not displayed.     Erin Fulling 04/15/2023, 11:32 AM

## 2023-05-04 NOTE — Progress Notes (Signed)
 Patient pronounced dead by this RN and Museum/gallery conservator at 1059 post code blue. Code started at 1005 and stopped at 1059. Family at bedside have been updated by Dr Belia Heman, pt has no rhythm, Asystole on the monitor, no spontaneous breathing noted, no bp, pt unresponsive, pupils fixed.

## 2023-05-04 NOTE — Consult Note (Signed)
 Cardiology Consultation   Patient ID: Billy Cherry MRN: [id number]; DOB: 17-Sep-1970  Admit date: 04/18/2023 Date of Consult: 04/29/2023  PCP:  [facility]    HeartCare Providers Cardiologist:  None     New consult completed by Dr Mariah Milling   Patient Profile:   Billy Cherry is a 53 y.o. male with a hx of anoxic brain injury, vegetative state status post tracheostomy and PEG tube placement, HFrEF, nonischemic cardiomyopathy, hypertension, DVT on chronic anticoagulation, type 2 diabetes, seizure disorder,  who is being seen 04/23/2023 for the evaluation of recurrent episodes of V-tach and V-fib s/p arrest (from last evening) at the request of Dr Belia Heman.  History of Present Illness:   Billy Cherry recently admitted with acute on chronic hypoxic respiratory failure secondary to pneumonia on 04/18/2023.  The majority of the information was obtained on arrival by the patient's wife as the patient is nonverbal after suffering an anoxic brain injury 3 years ago.  According to the wife the patient had increased shortness of breath increased tracheal secretions in the past several days.  He had increased fluid retention particularly in the arms.  He was found to be hypoxic with oxygen saturations of 88% on room air which improved to 99% on 4 to 6 L of oxygen in the emergency department.  In the emergency department was found to have a BNP of 349, potassium 5.8, GFR greater than 60, WBCs of 17.1, negative PCR for flu, COVID, and RSV, high-sensitivity troponin 13-15, procalcitonin 0.17, chest x-ray showed bilateral basilar opacity the left was worse than right.  Temperature 99.1.  Blood pressure 125/100 with a heart rate of 108, respirations of 22.  He was admitted to PCU as an inpatient.  Patient was transferred to ICU for worsening hypoxemia on 04/27/2023, prolonged periods of apnea with trach collar, increased copious secretions and persistent fever.  Imaging revealed  bilateral pneumonia.  He was started on empiric antibiotics with vancomycin and Zosyn and subsequently vancomycin was discontinued he was maintained on Zosyn.  Kidney function had been deteriorating since admission with a baseline serum creatinine of 1.26 increased on arrival 3.59 with BUN 138.  GFR was down to 20.  He remains tachycardic with his SpO2 fluctuating between 93 to 98% on 10 L of oxygen via trach collar.  With copious secretions in hypoxia he underwent bronchoscopy.  Thick mucoid secretions and purulent secretions extracted several times as well as lavage for mucopurulent mucoid secretions.  Nephrology continue to follow for acute kidney injury secondary to inflammation infection resulting in ATN.  Imaging revealed no hydronephrosis.  On 04/25/2023 he required 70% heated high flow nasal cannula.  Multifocal pneumonia versus aspiration pneumonitis with sputum culture positive for multiple organisms Enterococcus bacillus, Pseudomonas and Proteus.  Infectious disease was consulted and patient remained febrile with improving leukocytosis, blood cultures remain positive with worsening respiratory status.  On 2/22 he had worsening hypoxia requiring higher level FiO2 was placed on 2 L of oxygen.  Intermittently having apneic episodes, repeat chest x-ray with concern for pulmonary vascular congestion, received 1 dose of IV Lasix 80 mg, renal function plateaued, after discussing with nephrology patient was started on Lasix infusion.  Primary hospitalist discussed case with PCCM and transferred from ICU for close monitoring as he was high risk for requiring ventilator management.  2/24 he had worsening hypoxia and was placed on heated high flow oxygen 50% FiO2.  BAL cultures growing several organisms.  Continued on antibiotic therapy.  On  2/25 of good blue was called patient underwent bag valve to trach and if effective CPR was being for per the monitoring.  ROSC was achieved.  Admitted to the ENT was consulted  for tracheostomy tube change from cuffless to cuffed trach for ventilation purposes.  Patient tolerated the change well without any immediate complications.  He had a triple-lumen cath that was placed.  At 1628 PM he went into SVT immediately followed by pulseless V. Tach.  CPR and ACLS protocol was initiated.  ROSC was achieved at 1640.  Because cardiac arrest amiodarone drip initiated 60 mg/h patient returned to sinus rhythm with heart rates in the 80s.  Due to hypotension Neo-Synephrine drip was started to maintain MAP 65 mmHg higher.  Patient's wife but was not notified of the arrest via telephone.  Throughout the night he had been in of torsades, V-fib, V. Tach.  Cardiology was consulted for concerns of arrhythmias overnight status postarrest.  Past Medical History:  Diagnosis Date   Anoxic brain injury ([name])    Hypertension     Past Surgical History:  Procedure Laterality Date   BACK SURGERY     CORONARY/GRAFT ACUTE MI REVASCULARIZATION N/A 03/23/2021   Procedure: Coronary/Graft Acute MI Revascularization;  Surgeon: Marcina Millard, MD;  Location: [facility] INVASIVE CV LAB;  Service: Cardiovascular;  Laterality: N/A;   IR GASTROSTOMY TUBE MOD SED  04/26/2021   LEFT HEART CATH AND CORONARY ANGIOGRAPHY N/A 03/23/2021   Procedure: LEFT HEART CATH AND CORONARY ANGIOGRAPHY;  Surgeon: Marcina Millard, MD;  Location: [facility] INVASIVE CV LAB;  Service: Cardiovascular;  Laterality: N/A;   SHOULDER SURGERY       Home Medications:  Prior to Admission medications   Medication Sig Start Date End Date Taking? Authorizing Provider  acetaminophen (TYLENOL) 325 MG tablet Take 650 mg by mouth every 6 (six) hours as needed for headache, fever, moderate pain or mild pain.   Yes [provider]  albuterol (PROVENTIL) (2.5 MG/3ML) 0.083% nebulizer solution Take 3 mLs (2.5 mg total) by nebulization every 6 (six) hours as needed for wheezing or shortness of breath. Patient taking differently: Take 1  mL by nebulization every 2 (two) hours as needed for wheezing or shortness of breath. 11/11/21  Yes Haydee Salter, MD  atropine 1 % ophthalmic solution Place 2 drops under the tongue 4 (four) times daily. Patient taking differently: Place 2 drops under the tongue 4 (four) times daily. 0800/1400/2000/0200 11/07/21  Yes Haydee Salter, MD  carvedilol (COREG) 6.25 MG tablet Place 1 tablet (6.25 mg total) into feeding tube 2 (two) times daily with a meal. 05/09/22 04/18/23 Yes Haydee Salter, MD  chlorhexidine gluconate, MEDLINE KIT, (PERIDEX) 0.12 % solution 15 mLs by Mouth Rinse route 2 (two) times daily. 03/30/21  Yes Judithe Modest, NP  clonazePAM (KLONOPIN) 0.5 MG tablet Place 1 tablet (0.5 mg total) into feeding tube 2 (two) times daily as needed for anxiety. Patient taking differently: Place 0.5 mg into feeding tube 2 (two) times daily. 05/29/22 04/18/23 Yes Haydee Salter, MD  doxazosin (CARDURA) 2 MG tablet Place 1 tablet (2 mg total) into feeding tube at bedtime. 06/20/22 04/18/23 Yes Haydee Salter, MD  ELIQUIS 5 MG TABS tablet PLACE 1 TABLET (5 MG TOTAL) INTO FEEDING TUBE 2 (TWO) TIMES DAILY. 06/20/22  Yes Haydee Salter, MD  famotidine (PEPCID) 20 MG tablet PLACE 1 TABLET (20 MG TOTAL) INTO FEEDING TUBE 2 (TWO) TIMES DAILY. 03/20/22 04/18/23 Yes Haydee Salter, MD  fluticasone (FLONASE) 50 MCG/ACT nasal spray Place 2 sprays into both nostrils daily. 12/29/21  Yes Haydee Salter, MD  Glucose 4-6 GM-MG CHEW Give 16 g by tube See admin instructions. Every 15 mins 07/11/22  Yes [provider]  glycopyrrolate (ROBINUL) 1 MG tablet Place 1 tablet (1 mg total) into feeding tube 3 (three) times daily. Patient taking differently: Place 1 mg into feeding tube 3 (three) times daily. 0800/1400/2000 11/14/21 04/18/23 Yes Haydee Salter, MD  HEALTHYLAX 17 g packet Take 17 g by mouth 2 (two) times daily.   Yes [provider]  hydrALAZINE (APRESOLINE) 50 MG tablet PLACE 1 TABLET (50 MG  TOTAL) INTO FEEDING TUBE 3 (THREE) TIMES DAILY. 06/20/22 04/18/23 Yes Haydee Salter, MD  ibuprofen (ADVIL) 200 MG tablet Take 200 mg by mouth every 6 (six) hours as needed for fever, headache, mild pain, moderate pain or cramping.   Yes [provider]  isosorbide dinitrate (ISORDIL) 30 MG tablet Place 1 tablet (30 mg total) into feeding tube 3 (three) times daily. 05/09/22  Yes Haydee Salter, MD  LANTUS SOLOSTAR 100 UNIT/ML Solostar Pen Inject 15 Units into the skin at bedtime. 07/11/22  Yes [provider]  levETIRAcetam (KEPPRA) 100 MG/ML solution Place 7.5 mLs (750 mg total) into feeding tube 2 (two) times daily. 12/27/22  Yes Merwyn Katos, MD  metFORMIN (GLUCOPHAGE) 500 MG tablet Take 500 mg by mouth daily with breakfast.   Yes [provider]  metoCLOPramide (REGLAN) 5 MG/5ML solution Take 10 mg by mouth every 6 (six) hours. 0200/0800/1400/2000   Yes [provider]  ondansetron (ZOFRAN) 4 MG tablet Take 4 mg by mouth every 6 (six) hours as needed for nausea or vomiting.   Yes [provider]  scopolamine (TRANSDERM-SCOP) 1 MG/3DAYS PLACE 1 PATCH ONTO THE SKIN EVERY 3 DAYS. 05/09/22  Yes Haydee Salter, MD  sodium chloride HYPERTONIC 3 % nebulizer solution Take 3 mLs by nebulization every 4 (four) hours as needed for other or cough.   Yes [provider]  spironolactone (ALDACTONE) 25 MG tablet Place 1 tablet (25 mg total) into feeding tube daily. 05/09/22 04/18/23 Yes Haydee Salter, MD  valproic acid (DEPAKENE) 250 MG/5ML solution Place 15 mLs (750 mg total) into feeding tube 3 (three) times daily. 05/09/22 04/18/23 Yes Haydee Salter, MD  fiber (NUTRISOURCE FIBER) PACK packet Place 1 packet into feeding tube 2 (two) times daily. Patient not taking: Reported on 08/09/2022 10/19/21   Lewie Chamber, MD  hydrocortisone (ANUSOL-HC) 2.5 % rectal cream Apply 1 Application topically 4 (four) times daily as needed for hemorrhoids. Patient not  taking: Reported on 08/09/2022 06/05/22   Enedina Finner, MD  ipratropium-albuterol (DUONEB) 0.5-2.5 (3) MG/3ML SOLN TAKE 3 ML BY NEBULIZATION EVERY 4 HOUR AS NEED (WHEEZING, SHORTNESS OF BREATH). USE FIRST BEFORE ALBUTEROL Patient not taking: Reported on 04/18/2023 05/09/22   Haydee Salter, MD  Loperamide HCl 1 MG/7.5ML LIQD Place 15 mLs (2 mg total) into feeding tube daily as needed for diarrhea or loose stools. Patient not taking: Reported on 08/09/2022 11/14/21   Haydee Salter, MD  Mouthwashes (MOUTH RINSE) LIQD solution 15 mLs by Mouth Rinse route every 4 (four) hours. Patient not taking: Reported on 08/09/2022 03/30/21   Judithe Modest, NP  Nebulizer System All-In-One MISC 1 Inhalation by Does not apply route every hour as needed. 11/11/21   Haydee Salter, MD  Nutritional Supplements (FEEDING SUPPLEMENT, OSMOLITE 1.5 CAL,) LIQD Place  1,000 mLs into feeding tube continuous. 10/04/21   Burnadette Pop, MD  trimethoprim-polymyxin b (POLYTRIM) ophthalmic solution Place 1 drop into both eyes See admin instructions. Every 3 hours for 7-10 days Patient not taking: Reported on 04/18/2023 06/14/22   [provider]  Water For Irrigation, Sterile (FREE WATER) SOLN Place 200 mLs into feeding tube every 6 (six) hours. 08/07/21   Pokhrel, Rebekah Chesterfield, MD    Inpatient Medications: Scheduled Meds:  amiodarone  150 mg Intravenous Once   apixaban  5 mg Per Tube BID   atropine  2 drop Sublingual QID   carvedilol  6.25 mg Per Tube BID WC   Chlorhexidine Gluconate Cloth  6 each Topical Daily   clonazePAM  0.5 mg Per Tube BID   famotidine  20 mg Per Tube BID   feeding supplement (PROSource TF20)  60 mL Per Tube Daily   free water  30 mL Per Tube Q4H   glycopyrrolate  1 mg Per Tube TID   ipratropium-albuterol  3 mL Nebulization TID   levETIRAcetam  750 mg Per Tube BID   lidocaine  50 mg Intravenous Once   metoCLOPramide  10 mg Per Tube Q8H   nutrition supplement (JUVEN)  1 packet Per Tube BID BM   mouth rinse   15 mL Mouth Rinse 4 times per day   oxyCODONE  2.5 mg Per Tube Q8H   valproic acid  750 mg Per Tube TID   Continuous Infusions:  amiodarone 60 mg/hr (04/16/2023 0644)   feeding supplement (NEPRO CARB STEADY) 50 mL/hr at 04/23/2023 0600   furosemide (LASIX) 200 mg in dextrose 5 % 100 mL (2 mg/mL) infusion 4 mg/hr (05/02/2023 0600)   insulin 18 Units/hr (04/23/2023 0657)   lidocaine     phenylephrine (NEO-SYNEPHRINE) Adult infusion Stopped (04/30/23 2301)   piperacillin-tazobactam (ZOSYN)  IV 12.5 mL/hr at 04/27/2023 0600   PRN Meds: acetaminophen (TYLENOL) oral liquid 160 mg/5 mL, albuterol, dextrose, guaiFENesin, iohexol, LORazepam, ondansetron (ZOFRAN) IV, mouth rinse, polyethylene glycol  Allergies:    Allergies  Allergen Reactions   Lisinopril Swelling    angioedema  Other Reaction(s): angioedema    Social History:   Social History   Socioeconomic History   Marital status: Married    Spouse name: Not on file   Number of children: Not on file   Years of education: Not on file   Highest education level: Not on file  Occupational History   Not on file  Tobacco Use   Smoking status: Never   Smokeless tobacco: Never  Vaping Use   Vaping status: Unknown  Substance and Sexual Activity   Alcohol use: Yes   Drug use: Never   Sexual activity: Never  Other Topics Concern   Not on file  Social History Narrative   Not on file   Social Drivers of Health   Financial Resource Strain: High Risk (07/04/2022)   Received from [facility]   Overall Financial Resource Strain (CARDIA)    Difficulty of Paying Living Expenses: Hard  Food Insecurity: Patient Declined (04/19/2023)   Hunger Vital Sign    Worried About Running Out of Food in the Last Year: Patient declined    Ran Out of Food in the Last Year: Patient declined  Transportation Needs: Patient Unable To Answer (04/19/2023)   PRAPARE - Transportation    Lack of Transportation (Medical): Patient unable to answer     Lack of Transportation (Non-Medical): Patient unable to answer  Physical Activity:  Not on file  Stress: Not on file  Social Connections: Not on file  Intimate Partner Violence: Unknown (04/19/2023)   Humiliation, Afraid, Rape, and Kick questionnaire    Fear of Current or Ex-Partner: Patient unable to answer    Emotionally Abused: Patient unable to answer    Physically Abused: Not on file    Sexually Abused: Patient unable to answer    Family History:   No family history on file.   ROS:  Please see the history of present illness.  Review of Systems  Reason unable to perform ROS: Pt unresponsive on the ventilator.    All other ROS reviewed and negative.     Physical Exam/Data:   Vitals:   04/17/2023 0500 04/07/2023 0600 04/25/2023 0622 04/28/2023 0724  BP: 117/82 117/84 109/76   Pulse: 84 87 85 82  Resp: 20 20 20 20   Temp:  99.4 F (37.4 C) 99.1 F (37.3 C)   TempSrc:  Oral Oral   SpO2: 96% 96% 95% 94%  Weight:      Height:        Intake/Output Summary (Last 24 hours) at 04/06/2023 0801 Last data filed at 04/24/2023 0600 Gross per 24 hour  Intake 2497.09 ml  Output 775 ml  Net 1722.09 ml      04/30/2023    5:00 AM 04/27/2023    5:00 AM 04/26/2023    4:56 AM  Last 3 Weights  Weight (lbs) -- 263 lb 14.3 oz 259 lb 0.7 oz  Weight (kg) -- 119.7 kg 117.5 kg     Body mass index is 36.81 kg/m.  General:  Well nourished, well developed, chronically ill-appearing HEENT: normal Neck: unable to determine JVD due to body habitus Vascular: No carotid bruits; Distal pulses 2+ bilaterally Cardiac:  normal S1, S2; RRR; no murmur  Lungs:  coarse to auscultation bilaterally, respirations even assisted positive for rhonchi and expiratory wheezing Abd: soft, nontender, obese, PEG tube patient, no hepatomegaly  Ext: no edema Musculoskeletal:  No deformities, BUE and BLE strength normal and equal Skin: warm and dry  Neuro: Baseline anoxic brain injury   EKG:  The EKG was personally  reviewed and demonstrates: Sinus rhythm with rate 88, LVH secondary to repolarization, left axis deviation, ST pression T wave inversions in lateral leads Telemetry:  Telemetry was personally reviewed and demonstrates: Sinus rhythm with initial episode of coarse V-fib, V-tach and torsades de points (noted on 2/25 at 1627, another additional short run at 0442, 0449, 0457, 500 AM, 0712, 0747 torsades)  Relevant CV Studies: 2D echo 04/20/2023 1. Left ventricular ejection fraction, by estimation, is 30 to 35%. The  left ventricle has moderately decreased function. The left ventricle  demonstrates global hypokinesis. The left ventricular internal cavity size  was moderately dilated. There is mild   concentric left ventricular hypertrophy. Left ventricular diastolic  parameters are consistent with Grade II diastolic dysfunction  (pseudonormalization). The global longitudinal strain is abnormal.   2. Right ventricular systolic function is mildly reduced. The right  ventricular size is moderately enlarged.   3. Left atrial size was moderately dilated.   4. Right atrial size was moderately dilated.   5. The mitral valve is grossly normal. Trivial mitral valve  regurgitation.   6. The aortic valve is grossly normal. Aortic valve regurgitation is  trivial.   Limited 2D echo 09/17/2021 1. Limited study due to poor echo windows.   2. Left ventricular ejection fraction, by estimation, is 30 to 35%. The  left  ventricle has moderately decreased function. There is  mild-to-moderate concentric left ventricular hypertrophy.   3. Right ventricular systolic function is normal. The right ventricular  size is normal.   4. A small pericardial effusion is present. The pericardial effusion is  posterior and lateral to the left ventricle.   5. The mitral valve is abnormal.   2D echo 03/23/2021 1. Left ventricular ejection fraction, by estimation, is <20%. The left  ventricle has severely decreased function. The  left ventricle demonstrates  global hypokinesis. The left ventricular internal cavity size was mildly  dilated. There is mild left  ventricular hypertrophy. Left ventricular diastolic parameters are  consistent with Grade II diastolic dysfunction (pseudonormalization).   2. Right ventricular systolic function is normal. The right ventricular  size is normal.   3. The mitral valve is normal in structure. Trivial mitral valve  regurgitation.   4. The aortic valve is normal in structure. Aortic valve regurgitation is  not visualized.   LHC 03/23/2021   Dist RCA lesion is 20% stenosed.   There is mild left ventricular systolic dysfunction.   The left ventricular ejection fraction is 45-50% by visual estimate.   1.  Insignificant coronary artery disease 2.  Mild reduced left ventricular function with estimated LV ejection fraction 45 to 50%   Recommendations   1.  Admit to ICU for ventilator management 2.  2D echocardiogram Laboratory Data:  High Sensitivity Troponin:   Recent Labs  Lab 04/18/23 1734 04/18/23 1900 04/29/23 2020 04/29/23 2235 04/30/23 1650  TROPONINIHS 15 16 65* 72* 88*     Chemistry Recent Labs  Lab 04/30/23 0505 04/30/23 1650 04/19/2023 0305  NA 145 146* 143  K 4.4 4.4 3.4*  CL 98 97* 97*  CO2 33* 36* 29  GLUCOSE 298* 400* 302*  BUN 185* 195* 204*  CREATININE 3.48* 3.62* 3.75*  CALCIUM 8.9 8.7* 8.3*  MG 3.7* 4.8* 3.7*  GFRNONAA 20* 19* 19*  ANIONGAP 14 13 17*    Recent Labs  Lab 04/26/23 0615 04/29/23 0910 04/16/2023 0305  PROT  --   --  5.8*  ALBUMIN 2.4* 2.1* 2.0*  2.1*  AST  --   --  28  ALT  --   --  17  ALKPHOS  --   --  64  BILITOT  --   --  0.6   Lipids No results for input(s): "CHOL", "TRIG", "HDL", "LABVLDL", "LDLCALC", "CHOLHDL" in the last 168 hours.  Hematology Recent Labs  Lab 04/29/23 0910 04/30/23 0505 04/11/2023 0305  WBC 11.3* 10.4 11.9*  RBC 2.29* 2.23* 1.88*  HGB 7.6* 7.4* 6.4*  HCT 25.7* 24.9* 20.9*  MCV 112.2*  111.7* 111.2*  MCH 33.2 33.2 34.0  MCHC 29.6* 29.7* 30.6  RDW 14.6 14.6 15.1  PLT 195 202 198   Thyroid No results for input(s): "TSH", "FREET4" in the last 168 hours.  BNPNo results for input(s): "BNP", "PROBNP" in the last 168 hours.  DDimer  Recent Labs  Lab 04/26/23 2016  DDIMER 0.70*     Radiology/Studies:  DG Chest Port 1 View Result Date: 04/30/2023 CLINICAL DATA:  Acute hypoxic respiratory failure. EXAM: PORTABLE CHEST 1 VIEW COMPARISON:  Radiographs 04/29/2023 and 04/26/2023.  CT 04/24/2023. FINDINGS: 1805 hours. Tracheostomy tube remains adequately positioned. The heart size and mediastinal contours are stable with cardiac enlargement. There are persistent left greater than right basilar airspace opacities and possible small bilateral pleural effusions. No pneumothorax. The bones appear unchanged. IMPRESSION: Persistent left greater than right basilar  airspace opacities and possible small bilateral pleural effusions. No significant change from prior study. Electronically Signed   By: Carey Bullocks M.D.   On: 04/30/2023 18:33   DG Chest Port 1 View Result Date: 04/29/2023 CLINICAL DATA:  Acute hypoxic respiratory failure EXAM: PORTABLE CHEST 1 VIEW COMPARISON:  03/25/2023 FINDINGS: Tracheostomy tube in satisfactory position poor inspiration. Stable enlarged cardiac silhouette less patchy opacity in both lungs with dense residual left lower lobe opacity. No definite pleural fluid seen. Unremarkable bones. IMPRESSION: Improved aeration of both lungs with dense residual left lower lobe atelectasis or pneumonia. Electronically Signed   By: Beckie Salts M.D.   On: 04/29/2023 13:59     Assessment and Plan:   Severe acute hypoxic and hypercapnic respiratory failure -Patient had slowly worsening hypoxia and apneic, underwent trach and changeover, and had to end up being placed on ventilator at 50% FiO2 to maintain oxygen saturations. -Also underwent bronchoscopy for multifocal  pneumonia to remove mucous plugging. -FiO2 and PEEP for weakness tolerated -Pulmonary hygiene -Supportive care -Continue management per PCCM  V. tach/V-fib/torsades -Patient several repeat episodes of V-fib, V. tach, torsades noted on telemetry overnight -Initially had first episode of trach change over evening before -having continuous runs throughout the night -Was started on lidocaine infusion to help suppress arrhythmias -Hypokalemic with potassium supplementation given -Continue on IV amiodarone at 60 mg and can repeat bolus if continues to have runs of V. tach, V-fib or torsades -Continue with lidocaine infusion -Continue to treat underlying causes of hypoxia, metabolic derangement, anemia, etc (multifactorial) -With telemetry monitoring  Acute on chronic HFrEF -EF of 30%-35% on echocardiogram -Previous low EF -Prior ischemic workup completed in 2023 revealed insignificant coronary artery disease -Unable to start GDMT related to hypotension requiring pressors and AKI -+1.7 L in the last 24 hours, +10.4 L since admission -Lasix infusion 4 mg an hour per nephrology -Daily weights, I's and O's   AKI/renal failure -Worsening renal failure with a serum creatinine of 3.75 -To start CRRT this afternoon -Daily BMP -Monitor/trend/replete electrolytes as needed -Monitor urine output -Nephrology continues to follow -Avoid nephrotoxic agents were able  Symptomatic anemia -Hemoglobin 6.4 this morning -Recommend transfusion to keep hemoglobin greater than 8 -No signs of active bleeding -Hemoglobin 10 days ago 9.1 -Daily CBC -If continued drop in hemoglobin we will have to discontinue apixaban  Metabolic derangement -Potassium 3.4 magnesium 3.7 -Potassium supplementations ordered -Monitor/treat/replete electrolytes as needed -Recommend keeping potassium 4 but less than 5 and magnesium 2 -Daily BMP  Anoxic encephalopathy -Supportive care   Risk Assessment/Risk Scores:                 For questions or updates, please contact [facility] Please consult [email or web address] for contact info under    Signed, Fatemah Pourciau, NP  04/15/2023 8:01 AM;t

## 2023-05-04 NOTE — Progress Notes (Signed)
 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    Patient has been transferred to the ICU for worsening hypoxemia, prolonged periods of apnea on trach collar, increased copious secretions and persistent fever.   BRONCH AT BEDSIDE WITH WIFE AT BEDSIDE EXTENSIVE MUCUS PLUGS  Imaging studies showed bilateral pneumonia.  Patient was started on empiric antibiotics.  With vancomycin and Zosyn and subsequently vancomycin was discontinued the patient was maintained on Zosyn. Patient's kidney function has also been deteriorating since admission.     His baseline creatinine 1.26 and has now increased to 3.59 with a BUN of 138 mg/dL.  His GFR is down to 20.  He remains tachycardic and his SpO2 is fluctuating between 93 and 98% on 10 L of oxygen via trach collar.     Significant Hospital Events: Including procedures, antibiotic start and stop dates in addition to other pertinent events   04/29/23- Patient requires 70%HHFNC.  Reviewed Rx with pharmacist and refined pneumonia regimen. He has below +micro fromBAL.  Now on Zosyn IV.  04/30/23- patient is still critically ill with poor prognosis.  Leukocysotis has resolved. Hyperglycemia is worse this am.  Renal function is stable 2/25 trach changed at bedside, subsequent cardiac arrest V tach shocked twice 2/25 CVL placed  2/26 in and out of V VIB, V tach, plan for VASC CATH AND DIALYSIS     Micro Data:  2/17 Pseudomonas, Proteus RESP cx's 2/22 Pseudomonas, eneterococcus   Antimicrobials:    Antibiotics Given (last 72 hours)     Date/Time Action Medication Dose Rate   04/28/23 1734 New Bag/Given   cefTAZidime (FORTAZ) 2 g in sodium chloride 0.9 % 100 mL IVPB 2 g 200 mL/hr   04/29/23 0532 New Bag/Given   cefTAZidime (FORTAZ) 2 g in sodium chloride 0.9 % 100 mL IVPB 2 g 200 mL/hr   04/29/23 1427 New Bag/Given   piperacillin-tazobactam (ZOSYN) IVPB 3.375 g 3.375 g 12.5 mL/hr   04/29/23 2108 New Bag/Given   piperacillin-tazobactam (ZOSYN) IVPB 3.375 g 3.375 g 12.5 mL/hr   04/30/23 0608 New Bag/Given   piperacillin-tazobactam (ZOSYN) IVPB 3.375 g 3.375 g 12.5 mL/hr   04/30/23 1349 New Bag/Given   piperacillin-tazobactam (ZOSYN) IVPB 3.375 g 3.375 g 12.5 mL/hr   04/30/23 2130 New Bag/Given   piperacillin-tazobactam (ZOSYN) IVPB 3.375 g 3.375 g 12.5 mL/hr   04/14/2023 0505 New Bag/Given   piperacillin-tazobactam (ZOSYN) IVPB 3.375 g 3.375 g 12.5 mL/hr            Interim History / Subjective:  critically ill Severe hypoxia Requires VENT support for survival V fib and V tach severe sCHF High risk for cardiac arrest and death  Vent Mode: PRVC FiO2 (%):  [50 %-100 %] 50 % Set Rate:  [18 bmp-22 bmp] 20 bmp Vt Set:  [480 mL] 480 mL PEEP:  [8 cmH20] 8 cmH20 Plateau Pressure:  [24 cmH20-27 cmH20] 27 cmH20    Objective   Blood pressure 109/76, pulse 82, temperature 99.1 F (37.3 C), temperature source Oral, resp. rate 20, height 5\' 11"  (1.803 m), weight 119.7 kg, SpO2  94%.    Vent Mode: PRVC FiO2 (%):  [50 %-100 %] 50 % Set Rate:  [18 bmp-22 bmp] 20 bmp Vt Set:  [480 mL] 480 mL PEEP:  [8 cmH20] 8 cmH20 Plateau Pressure:  [24 cmH20-27 cmH20] 27 cmH20   Intake/Output Summary (Last 24 hours) at 05/02/2023 0756 Last data filed at 04/29/2023 0600 Gross per 24 hour  Intake 2497.09 ml  Output 775 ml  Net 1722.09 ml   Filed Weights   04/25/23 0500 04/26/23 0456 04/27/23 0500  Weight: 117.2 kg 117.5 kg 119.7 kg     REVIEW OF SYSTEMS  PATIENT IS UNABLE TO  PROVIDE COMPLETE REVIEW OF SYSTEMS DUE TO SEVERE CRITICAL ILLNESS   PHYSICAL EXAMINATION:  GENERAL:critically ill appearing, +resp distress EYES: Pupils equal, round, reactive to light.  No scleral icterus.  MOUTH: Moist mucosal membrane. S/p trach NECK: Supple.  PULMONARY: Lungs clear to auscultation, +rhonchi, +wheezing CARDIOVASCULAR: S1 and S2.  Regular rate and rhythm GASTROINTESTINAL: Soft, nontender, -distended. Positive bowel sounds. S/p peg MUSCULOSKELETAL: edema.  NEUROLOGIC: sedated SKIN:normal, warm to touch, Capillary refill delayed  Pulses present bilaterally   Labs/imaging that I havepersonally reviewed  (right click and "Reselect all SmartList Selections" daily)     ASSESSMENT AND PLAN SYNOPSIS  53 yo unfortunate male with h/o brain damage from cardiac arrest due to angioedema 2 years ago with persistent vegetative state, with progressive resp failure and renal failure during this admission due to severe sepsis  and pneumonia with severe end stage sCHF  Severe ACUTE Hypoxic and Hypercapnic Respiratory Failure -continue Mechanical Ventilator support -Wean Fio2 and PEEP as tolerated -VAP/VENT bundle implementation - Wean PEEP & FiO2 as tolerated, maintain SpO2 > 88% - Head of bed elevated 30 degrees, VAP protocol in place - Plateau pressures less than 30 cm H20  - Intermittent chest x-ray & ABG PRN - Ensure adequate pulmonary hygiene  Vent support TRACH CHANGED TO CUFFED-s/p cardiac arrest  Vent Mode: PRVC FiO2 (%):  [50 %-100 %] 50 % Set Rate:  [18 bmp-22 bmp] 20 bmp Vt Set:  [480 mL] 480 mL PEEP:  [8 cmH20] 8 cmH20 Plateau Pressure:  [24 cmH20-27 cmH20] 27 cmH20    CARDIAC FAILURE-acute  systolic dysfunction Ef 30%, V tach and V fib -oxygen as needed -Lasix as tolerated -follow up cardiac enzymes as indicated Follow up cardiology evaluation  CARDIAC ICU monitoring  ACUTE KIDNEY INJURY/Renal Failure Plan for VASC AND CRRT/HD when wife arrives  to bedside -continue Foley Catheter-assess need -Avoid nephrotoxic agents -Follow urine output, BMP -Ensure adequate renal perfusion, optimize oxygenation -Renal dose medications   Intake/Output Summary (Last 24 hours) at 04/21/2023 0806 Last data filed at 04/10/2023 0600 Gross per 24 hour  Intake 2497.09 ml  Output 775 ml  Net 1722.09 ml    NEUROLOGY Severe brain damage Persistent Vegetative state Grave prognosis very poor quality of life At this time, seems that patient will never recover and have a normal life   INFECTIOUS DISEASE -continue antibiotics as prescribed -follow up cultures -follow up ID consultation   ENDO - ICU hypoglycemic\Hyperglycemia protocol -check FSBS per protocol   GI GI PROPHYLAXIS as indicated NUTRITIONAL STATUS DIET-->TF's as tolerated Constipation protocol as indicated   ELECTROLYTES -follow labs as needed -replace as needed -pharmacy consultation and following  RESTRICTIVE TRANSFUSION PROTOCOL TRANSFUSION  IF HGB<7  or ACTIVE BLEEDING OR DX of ACUTE CORONARY SYNDROMES      Best practice (right click and "Reselect all SmartList Selections" daily)  Diet: TF's VAP  protocol (if indicated): Yes DVT prophylaxis: SYSTEMIC AC GI prophylaxis:PEPCID Glucose control:  Insulin gtt Central venous access:  Yes, and it is still needed Arterial line:  N/A Foley:  Yes, and it is still needed Mobility:  bed rest  Code Status:  FULL Disposition:ICU  Labs   CBC: Recent Labs  Lab 04/26/23 0615 04/28/23 0436 04/29/23 0910 04/30/23 0505 04/14/2023 0305  WBC 9.8 14.8* 11.3* 10.4 11.9*  HGB 8.9* 8.0* 7.6* 7.4* 6.4*  HCT 29.3* 26.6* 25.7* 24.9* 20.9*  MCV 108.9* 110.8* 112.2* 111.7* 111.2*  PLT 175 185 195 202 198    Basic Metabolic Panel: Recent Labs  Lab 04/29/23 0910 04/29/23 2020 04/30/23 0505 04/30/23 1650 04/20/2023 0305  NA 144 144 145 146* 143  K 4.8 4.6 4.4 4.4 3.4*  CL 99 97* 98 97* 97*  CO2 33* 33* 33* 36* 29   GLUCOSE 293* 304* 298* 400* 302*  BUN 177* 185* 185* 195* 204*  CREATININE 3.38* 3.52* 3.48* 3.62* 3.75*  CALCIUM 8.9 9.1 8.9 8.7* 8.3*  MG  --  3.6* 3.7* 4.8* 3.7*  PHOS 6.6* 6.2* 5.9* 6.0* 3.1   GFR: Estimated Creatinine Clearance: 30.3 mL/min (A) (by C-G formula based on SCr of 3.75 mg/dL (H)). Recent Labs  Lab 04/28/23 0436 04/29/23 0910 04/29/23 2020 04/30/23 0505 04/30/23 1650 04/30/23 2141 04/25/2023 0012 04/06/2023 0305 04/07/2023 0457  WBC 14.8* 11.3*  --  10.4  --   --   --  11.9*  --   LATICACIDVEN  --   --    < >  --  4.3* 2.9* 3.5*  --  3.6*   < > = values in this interval not displayed.    Liver Function Tests: Recent Labs  Lab 04/26/23 0615 04/29/23 0910 04/11/2023 0305  AST  --   --  28  ALT  --   --  17  ALKPHOS  --   --  64  BILITOT  --   --  0.6  PROT  --   --  5.8*  ALBUMIN 2.4* 2.1* 2.0*  2.1*   No results for input(s): "LIPASE", "AMYLASE" in the last 168 hours. No results for input(s): "AMMONIA" in the last 168 hours.  ABG    Component Value Date/Time   PHART 7.43 05/03/2023 0412   PCO2ART 53 (H) 04/24/2023 0412   PO2ART 97 04/07/2023 0412   HCO3 35.2 (H) 04/10/2023 0412   ACIDBASEDEF 0.3 03/23/2021 0556   O2SAT 99.4 04/25/2023 0412     Coagulation Profile: No results for input(s): "INR", "PROTIME" in the last 168 hours.  Cardiac Enzymes: No results for input(s): "CKTOTAL", "CKMB", "CKMBINDEX", "TROPONINI" in the last 168 hours.  HbA1C: Hgb A1c MFr Bld  Date/Time Value Ref Range Status  04/21/2023 04:30 AM 5.2 4.8 - 5.6 % Final    Comment:    (NOTE) Pre diabetes:          5.7%-6.4%  Diabetes:              >6.4%  Glycemic control for   <7.0% adults with diabetes   11/10/2021 04:15 PM 5.4 4.8 - 5.6 % Final    Comment:             Prediabetes: 5.7 - 6.4          Diabetes: >6.4          Glycemic control for adults with diabetes: <7.0     CBG: Recent Labs  Lab 04/16/2023 0258 04/14/2023 0406 04/17/2023 1308  04/27/2023 0603  04/12/2023 0656  GLUCAP 277* 250* 225* 226* 221*     Past Medical History:  He,  has a past medical history of Anoxic brain injury ([name]) and Hypertension.   Surgical History:   Past Surgical History:  Procedure Laterality Date   BACK SURGERY     CORONARY/GRAFT ACUTE MI REVASCULARIZATION N/A 03/23/2021   Procedure: Coronary/Graft Acute MI Revascularization;  Surgeon: Marcina Millard, MD;  Location: [facility] INVASIVE CV LAB;  Service: Cardiovascular;  Laterality: N/A;   IR GASTROSTOMY TUBE MOD SED  04/26/2021   LEFT HEART CATH AND CORONARY ANGIOGRAPHY N/A 03/23/2021   Procedure: LEFT HEART CATH AND CORONARY ANGIOGRAPHY;  Surgeon: Marcina Millard, MD;  Location: [facility] INVASIVE CV LAB;  Service: Cardiovascular;  Laterality: N/A;   SHOULDER SURGERY       Social History:   reports that he has never smoked. He has never used smokeless tobacco. He reports current alcohol use. He reports that he does not use drugs.   Family History:  His family history is not on file.   Allergies Allergies  Allergen Reactions   Lisinopril Swelling    angioedema  Other Reaction(s): angioedema     Home Medications  Prior to Admission medications   Medication Sig Start Date End Date Taking? Authorizing Provider  acetaminophen (TYLENOL) 325 MG tablet Take 650 mg by mouth every 6 (six) hours as needed for headache, fever, moderate pain or mild pain.   Yes [provider]  albuterol (PROVENTIL) (2.5 MG/3ML) 0.083% nebulizer solution Take 3 mLs (2.5 mg total) by nebulization every 6 (six) hours as needed for wheezing or shortness of breath. Patient taking differently: Take 1 mL by nebulization every 2 (two) hours as needed for wheezing or shortness of breath. 11/11/21  Yes Haydee Salter, MD  atropine 1 % ophthalmic solution Place 2 drops under the tongue 4 (four) times daily. Patient taking differently: Place 2 drops under the tongue 4 (four) times daily. 0800/1400/2000/0200 11/07/21  Yes Haydee Salter, MD  carvedilol (COREG) 6.25 MG tablet Place 1 tablet (6.25 mg total) into feeding tube 2 (two) times daily with a meal. 05/09/22 04/18/23 Yes Haydee Salter, MD  chlorhexidine gluconate, MEDLINE KIT, (PERIDEX) 0.12 % solution 15 mLs by Mouth Rinse route 2 (two) times daily. 03/30/21  Yes Judithe Modest, NP  clonazePAM (KLONOPIN) 0.5 MG tablet Place 1 tablet (0.5 mg total) into feeding tube 2 (two) times daily as needed for anxiety. Patient taking differently: Place 0.5 mg into feeding tube 2 (two) times daily. 05/29/22 04/18/23 Yes Haydee Salter, MD  doxazosin (CARDURA) 2 MG tablet Place 1 tablet (2 mg total) into feeding tube at bedtime. 06/20/22 04/18/23 Yes Haydee Salter, MD  ELIQUIS 5 MG TABS tablet PLACE 1 TABLET (5 MG TOTAL) INTO FEEDING TUBE 2 (TWO) TIMES DAILY. 06/20/22  Yes Haydee Salter, MD  famotidine (PEPCID) 20 MG tablet PLACE 1 TABLET (20 MG TOTAL) INTO FEEDING TUBE 2 (TWO) TIMES DAILY. 03/20/22 04/18/23 Yes Haydee Salter, MD  fluticasone [facility]) 50 MCG/ACT nasal spray Place 2 sprays into both nostrils daily. 12/29/21  Yes Haydee Salter, MD  Glucose 4-6 GM-MG CHEW Give 16 g by tube See admin instructions. Every 15 mins 07/11/22  Yes [provider]  glycopyrrolate (ROBINUL) 1 MG tablet Place 1 tablet (1 mg total) into feeding tube 3 (three) times daily. Patient taking differently: Place 1 mg into feeding tube 3 (three) times daily. 0800/1400/2000 11/14/21 04/18/23 Yes Hobbs,  Talmadge Coventry, MD  HEALTHYLAX 17 g packet Take 17 g by mouth 2 (two) times daily.   Yes [provider]  hydrALAZINE (APRESOLINE) 50 MG tablet PLACE 1 TABLET (50 MG TOTAL) INTO FEEDING TUBE 3 (THREE) TIMES DAILY. 06/20/22 04/18/23 Yes Haydee Salter, MD  ibuprofen (ADVIL) 200 MG tablet Take 200 mg by mouth every 6 (six) hours as needed for fever, headache, mild pain, moderate pain or cramping.   Yes [provider]  isosorbide dinitrate (ISORDIL) 30 MG tablet Place 1 tablet  (30 mg total) into feeding tube 3 (three) times daily. 05/09/22  Yes Haydee Salter, MD  LANTUS SOLOSTAR 100 UNIT/ML Solostar Pen Inject 15 Units into the skin at bedtime. 07/11/22  Yes [provider]  levETIRAcetam (KEPPRA) 100 MG/ML solution Place 7.5 mLs (750 mg total) into feeding tube 2 (two) times daily. 12/27/22  Yes Merwyn Katos, MD  metFORMIN (GLUCOPHAGE) 500 MG tablet Take 500 mg by mouth daily with breakfast.   Yes [provider]  metoCLOPramide (REGLAN) 5 MG/5ML solution Take 10 mg by mouth every 6 (six) hours. 0200/0800/1400/2000   Yes [provider]  ondansetron (ZOFRAN) 4 MG tablet Take 4 mg by mouth every 6 (six) hours as needed for nausea or vomiting.   Yes [provider]  scopolamine (TRANSDERM-SCOP) 1 MG/3DAYS PLACE 1 PATCH ONTO THE SKIN EVERY 3 DAYS. 05/09/22  Yes Haydee Salter, MD  sodium chloride HYPERTONIC 3 % nebulizer solution Take 3 mLs by nebulization every 4 (four) hours as needed for other or cough.   Yes [provider]  spironolactone (ALDACTONE) 25 MG tablet Place 1 tablet (25 mg total) into feeding tube daily. 05/09/22 04/18/23 Yes Haydee Salter, MD  valproic acid (DEPAKENE) 250 MG/5ML solution Place 15 mLs (750 mg total) into feeding tube 3 (three) times daily. 05/09/22 04/18/23 Yes Haydee Salter, MD  fiber (NUTRISOURCE FIBER) PACK packet Place 1 packet into feeding tube 2 (two) times daily. Patient not taking: Reported on 08/09/2022 10/19/21   Lewie Chamber, MD  hydrocortisone (ANUSOL-HC) 2.5 % rectal cream Apply 1 Application topically 4 (four) times daily as needed for hemorrhoids. Patient not taking: Reported on 08/09/2022 06/05/22   Enedina Finner, MD  ipratropium-albuterol (DUONEB) 0.5-2.5 (3) MG/3ML SOLN TAKE 3 ML BY NEBULIZATION EVERY 4 HOUR AS NEED (WHEEZING, SHORTNESS OF BREATH). USE FIRST BEFORE ALBUTEROL Patient not taking: Reported on 04/18/2023 05/09/22   Haydee Salter, MD  Loperamide HCl 1 MG/7.5ML LIQD Place  15 mLs (2 mg total) into feeding tube daily as needed for diarrhea or loose stools. Patient not taking: Reported on 08/09/2022 11/14/21   Haydee Salter, MD  Mouthwashes (MOUTH RINSE) LIQD solution 15 mLs by Mouth Rinse route every 4 (four) hours. Patient not taking: Reported on 08/09/2022 03/30/21   Judithe Modest, NP  Nebulizer System All-In-One MISC 1 Inhalation by Does not apply route every hour as needed. 11/11/21   Haydee Salter, MD  Nutritional Supplements (FEEDING SUPPLEMENT, OSMOLITE 1.5 CAL,) LIQD Place 1,000 mLs into feeding tube continuous. 10/04/21   Burnadette Pop, MD  trimethoprim-polymyxin b (POLYTRIM) ophthalmic solution Place 1 drop into both eyes See admin instructions. Every 3 hours for 7-10 days Patient not taking: Reported on 04/18/2023 06/14/22   [provider]  Water For Irrigation, Sterile (FREE WATER) SOLN Place 200 mLs into feeding tube every 6 (six) hours. 08/07/21   Joycelyn Das, MD       DVT/GI PRX  assessed  I Assessed the need for Labs I Assessed the need for Foley I Assessed the need for Central Venous Line Family Discussion when available I Assessed the need for Mobilization I made an Assessment of medications to be adjusted accordingly Safety Risk assessment completed  CASE DISCUSSED IN MULTIDISCIPLINARY ROUNDS WITH ICU TEAM     Critical Care Time devoted to patient care services described in this note is 75 minutes.   Critical care was necessary to treat /prevent imminent and life-threatening deterioration.   PATIENT WITH VERY POOR PROGNOSIS I ANTICIPATE PROLONGED ICU LOS, PATIENT WILL LIKELY NEVER LEAVE THE HOSPITAL SETTING  Patient is critically ill. Patient with Multiorgan failure and at high risk for cardiac arrest and death.    Lucie Leather, M.D.  [facility]  Medical Director [facility] Medical Director [facility] Cardio-Pulmonary Department

## 2023-05-04 NOTE — Progress Notes (Signed)
 PATIENT NAME: Billy Cherry MEDICAL RECORD NUMBER: [id number] Birthday: 08-18-1970  Age: 53 y.o. Admit Date: 04/18/2023   Indication: V FIB/V [facility]  Technical Description:  CPR performance duration: 55 Was defibrillation or cardioversion used ? YES  Medications Administered Include      Yes/no Amiodarone YES  Atropin   Calcium YES  Epinephrine YES  Lidocaine YES  Magnesium YES  Norepinephrine   Phenylephrine   Sodium bicarbonate YES  Vasopression    Evaluation Patient with recurrent V fib and V tach for 55 mins Despite Lidocaine and amio infusions, patient with persistent V tach/V fib Cardiology at bedside Family at bedside-They have relayed to me to stop CPR and stop his suffering Wife and Aunty at bedside, Patient made DNR status   Ibeth Fahmy Santiago Glad, M.D.  [facility]  Medical Director [facility] Medical Director [facility] Cardio-Pulmonary Department

## 2023-05-04 NOTE — Procedures (Signed)
 Central Venous Catheter Insertion Procedure Note  Earl Losee  [id number]  08-02-1970  Date:04/27/2023  Time:8:40 AM   Provider Performing:Jamia Hoban D Elvina Sidle   Procedure: Insertion of Non-tunneled Central Venous Catheter(36556)with US guidance (40981)    Indication(s) Hemodialysis  Consent Risks of the procedure as well as the alternatives and risks of each were explained to the patient and/or caregiver.  Consent for the procedure was obtained and is signed in the bedside chart (Dr. Belia Heman obtained consent from pt's wife via telephone, myself and bedside RN Isabelle Course witnessed consent)  Anesthesia Topical only with 1% lidocaine   Timeout Verified patient identification, verified procedure, site/side was marked, verified correct patient position, special equipment/implants available, medications/allergies/relevant history reviewed, required imaging and test results available.  Sterile Technique Maximal sterile technique including full sterile barrier drape, hand hygiene, sterile gown, sterile gloves, mask, hair covering, sterile ultrasound probe cover (if used).  Procedure Description Area of catheter insertion was cleaned with chlorhexidine and draped in sterile fashion.   With real-time ultrasound guidance a HD catheter was placed into the left femoral vein.  Nonpulsatile blood flow and easy flushing noted in all ports.  The catheter was sutured in place and sterile dressing applied.  Complications/Tolerance None; patient tolerated the procedure well. Chest X-ray is ordered to verify placement for internal jugular or subclavian cannulation.  Chest x-ray is not ordered for femoral cannulation.  EBL Minimal  Specimen(s) None    Line inserted to the 20 cm mark. BIOPATCH applied to the insertion site.   Harlon Ditty, AGACNP-BC [facility] Prefer epic messenger for cross cover needs If after hours, please call E-link

## 2023-05-04 NOTE — Progress Notes (Signed)
  Chaplain On-Call responded to Code Blue notification at 1007 hours.  The Code Blue team provided CPR efforts to the patient for 55 minutes. The patient's wife, aunt, and cousin arrived, and requested that CPR measures be stopped.  Chaplain provided much spiritual and emotional support for the family in their intense grief, and prayer with them at the bedside after patient's death.  Chaplain Morene Crocker., [facility]

## 2023-05-04 NOTE — Progress Notes (Signed)
   04/11/2023 1500  Spiritual Encounters  Type of Visit Follow up  Care provided to: Family  OnCall Visit No  Spiritual Framework  Family Stress Factors Loss  Interventions  Spiritual Care Interventions Made Bereavement/grief support;Prayer  Intervention Outcomes  Outcomes Connection to spiritual care   Provided care to family who was mourning a patient who died. Prayed with the family.

## 2023-05-04 NOTE — Progress Notes (Signed)
 [facility]  ROUNDING NOTE   Subjective:   Patient remains critically ill. Currently on the ventilator. Experiencing significant arrhythmia with ventricular tachycardia and torsades. Family would like to move forward with continuous renal placement therapy.  Objective:  Vital signs in last 24 hours:  Temp:  [99.1 F (37.3 C)-99.6 F (37.6 C)] 99.6 F (37.6 C) (02/26 0820) Pulse Rate:  [49-126] 78 (02/26 0945) Resp:  [13-36] 20 (02/26 0945) BP: (84-144)/(65-105) 122/89 (02/26 0945) SpO2:  [74 %-99 %] 95 % (02/26 0945) FiO2 (%):  [50 %-100 %] 50 % (02/26 0820)  Weight change:  Filed Weights   04/25/23 0500 04/26/23 0456 04/27/23 0500  Weight: 117.2 kg 117.5 kg 119.7 kg    Intake/Output: I/O last 3 completed shifts: In: 2631.3 [I.V.:749.7; Other:360; NG/GT:815; IV Piggyback:706.6] Out: 1775 [Urine:1175; Stool:600]   Intake/Output this shift:  Total I/O In: 521 [I.V.:123.7; Blood:266; NG/GT:105; IV Piggyback:26.3] Out: -   Physical Exam: General: Critically ill-appearing  Head: Normocephalic, atraumatic. Moist oral mucosal membranes  Eyes: Anicteric  Lungs:  Coarse breath sounds bilateral, vent assisted  Heart: Regular rate and rhythm  Abdomen:  Soft, nontender, PEG tube  Extremities: Dependent peripheral edema.  Neurologic: Eyes closed, unable to follow commands  Skin: Stage II sacral decubitus  Access: Left femoral dialysis catheter    Basic Metabolic Panel: Recent Labs  Lab 04/29/23 0910 04/29/23 2020 04/30/23 0505 04/30/23 1650 04/17/2023 0305  NA 144 144 145 146* 143  K 4.8 4.6 4.4 4.4 3.4*  CL 99 97* 98 97* 97*  CO2 33* 33* 33* 36* 29  GLUCOSE 293* 304* 298* 400* 302*  BUN 177* 185* 185* 195* 204*  CREATININE 3.38* 3.52* 3.48* 3.62* 3.75*  CALCIUM 8.9 9.1 8.9 8.7* 8.3*  MG  --  3.6* 3.7* 4.8* 3.7*  PHOS 6.6* 6.2* 5.9* 6.0* 3.1    Liver Function Tests: Recent Labs  Lab 04/26/23 0615 04/29/23 0910 04/16/2023 0305  AST  --   --  28   ALT  --   --  17  ALKPHOS  --   --  64  BILITOT  --   --  0.6  PROT  --   --  5.8*  ALBUMIN 2.4* 2.1* 2.0*  2.1*   No results for input(s): "LIPASE", "AMYLASE" in the last 168 hours. No results for input(s): "AMMONIA" in the last 168 hours.  CBC: Recent Labs  Lab 04/26/23 0615 04/28/23 0436 04/29/23 0910 04/30/23 0505 05/02/2023 0305  WBC 9.8 14.8* 11.3* 10.4 11.9*  HGB 8.9* 8.0* 7.6* 7.4* 6.4*  HCT 29.3* 26.6* 25.7* 24.9* 20.9*  MCV 108.9* 110.8* 112.2* 111.7* 111.2*  PLT 175 185 195 202 198    Cardiac Enzymes: No results for input(s): "CKTOTAL", "CKMB", "CKMBINDEX", "TROPONINI" in the last 168 hours.  BNP: Invalid input(s): "POCBNP"  CBG: Recent Labs  Lab 04/14/2023 0603 04/07/2023 0656 04/07/2023 0759 04/10/2023 0912 04/24/2023 1001  GLUCAP 226* 221* 202* 199* 218*    Microbiology: Results for orders placed or performed during the hospital encounter of 04/18/23  Resp panel by RT-PCR (RSV, Flu A&B, Covid) Anterior Nasal Swab     Status: None   Collection Time: 04/18/23  3:44 PM   Specimen: Anterior Nasal Swab  Result Value Ref Range Status   SARS Coronavirus 2 by RT PCR NEGATIVE NEGATIVE Final    Comment: (NOTE) SARS-CoV-2 target nucleic acids are NOT DETECTED.  The SARS-CoV-2 RNA is generally detectable in upper respiratory specimens during the acute phase of infection. The  lowest concentration of SARS-CoV-2 viral copies this assay can detect is 138 copies/mL. A negative result does not preclude SARS-Cov-2 infection and should not be used as the sole basis for treatment or other patient management decisions. A negative result may occur with  improper specimen collection/handling, submission of specimen other than nasopharyngeal swab, presence of viral mutation(s) within the areas targeted by this assay, and inadequate number of viral copies(<138 copies/mL). A negative result must be combined with clinical observations, patient history, and  epidemiological information. The expected result is Negative.  Fact Sheet for Patients:  BloggerCourse.com  Fact Sheet for Healthcare Providers:  SeriousBroker.it  This test is no t yet approved or cleared by the [place] FDA and  has been authorized for detection and/or diagnosis of SARS-CoV-2 by FDA under an Emergency Use Authorization (EUA). This EUA will remain  in effect (meaning this test can be used) for the duration of the COVID-19 declaration under Section 564(b)(1) of the Act, 21 U.S.C.section 360bbb-3(b)(1), unless the authorization is terminated  or revoked sooner.       Influenza A by PCR NEGATIVE NEGATIVE Final   Influenza B by PCR NEGATIVE NEGATIVE Final    Comment: (NOTE) The Xpert Xpress SARS-CoV-2/FLU/RSV plus assay is intended as an aid in the diagnosis of influenza from Nasopharyngeal swab specimens and should not be used as a sole basis for treatment. Nasal washings and aspirates are unacceptable for Xpert Xpress SARS-CoV-2/FLU/RSV testing.  Fact Sheet for Patients: BloggerCourse.com  Fact Sheet for Healthcare Providers: SeriousBroker.it  This test is not yet approved or cleared by the [place] FDA and has been authorized for detection and/or diagnosis of SARS-CoV-2 by FDA under an Emergency Use Authorization (EUA). This EUA will remain in effect (meaning this test can be used) for the duration of the COVID-19 declaration under Section 564(b)(1) of the Act, 21 U.S.C. section 360bbb-3(b)(1), unless the authorization is terminated or revoked.     Resp Syncytial Virus by PCR NEGATIVE NEGATIVE Final    Comment: (NOTE) Fact Sheet for Patients: BloggerCourse.com  Fact Sheet for Healthcare Providers: SeriousBroker.it  This test is not yet approved or cleared by the [place] FDA and has been  authorized for detection and/or diagnosis of SARS-CoV-2 by FDA under an Emergency Use Authorization (EUA). This EUA will remain in effect (meaning this test can be used) for the duration of the COVID-19 declaration under Section 564(b)(1) of the Act, 21 U.S.C. section 360bbb-3(b)(1), unless the authorization is terminated or revoked.  Performed at [facility], [address] [place], [place] [address]   Blood culture (routine x 2)     Status: None   Collection Time: 04/18/23  5:30 PM   Specimen: BLOOD  Result Value Ref Range Status   Specimen Description BLOOD BLOOD LEFT FOREARM  Final   Special Requests   Final    BOTTLES DRAWN AEROBIC AND ANAEROBIC Blood Culture results may not be optimal due to an inadequate volume of blood received in culture bottles   Culture   Final    NO GROWTH 5 DAYS Performed at [facility], [address], [place], [place] [address]    Report Status 04/23/2023 FINAL  Final  Blood culture (routine x 2)     Status: None   Collection Time: 04/18/23  7:42 PM   Specimen: BLOOD  Result Value Ref Range Status   Specimen Description BLOOD BLOOD RIGHT HAND  Final   Special Requests   Final    BOTTLES DRAWN  AEROBIC AND ANAEROBIC Blood Culture adequate volume   Culture   Final    NO GROWTH 5 DAYS Performed at [facility], [address] [place], [place] [address]    Report Status 04/23/2023 FINAL  Final  Expectorated Sputum Assessment w Gram Stain, Rflx to Resp Cult     Status: None   Collection Time: 04/18/23  8:32 PM   Specimen: Sputum  Result Value Ref Range Status   Specimen Description SPUTUM  Final   Special Requests NONE  Final   Sputum evaluation   Final    THIS SPECIMEN IS ACCEPTABLE FOR SPUTUM CULTURE Performed at [facility], [address] [place], [place] [address]    Report Status 04/18/2023 FINAL  Final  Culture, Respiratory w Gram Stain     Status: None   Collection Time: 04/18/23  8:32 PM    Specimen: SPU  Result Value Ref Range Status   Specimen Description   Final    SPUTUM Performed at [facility], [address] [place], [place] [address]    Special Requests   Final    NONE Reflexed from [id number] Performed at [facility], [address] [place], [place] [address]    Gram Stain   Final    FEW SQUAMOUS EPITHELIAL CELLS PRESENT FEW WBC PRESENT,BOTH PMN AND MONONUCLEAR FEW YEAST FEW GRAM NEGATIVE RODS FEW GRAM POSITIVE RODS FEW GRAM POSITIVE COCCI IN PAIRS Performed at [facility], [address], [place], [place] [address]    Culture   Final    ABUNDANT PROVIDENCIA RETTGERI ABUNDANT PROTEUS MIRABILIS    Report Status 04/21/2023 FINAL  Final   Organism ID, Bacteria PROVIDENCIA RETTGERI  Final   Organism ID, Bacteria PROTEUS MIRABILIS  Final      Susceptibility   Proteus mirabilis - MIC*    AMPICILLIN <=2 SENSITIVE Sensitive     CEFEPIME <=0.12 SENSITIVE Sensitive     CEFTAZIDIME <=1 SENSITIVE Sensitive     CEFTRIAXONE <=0.25 SENSITIVE Sensitive     CIPROFLOXACIN <=0.25 SENSITIVE Sensitive     GENTAMICIN <=1 SENSITIVE Sensitive     IMIPENEM 2 SENSITIVE Sensitive     TRIMETH/SULFA <=20 SENSITIVE Sensitive     AMPICILLIN/SULBACTAM <=2 SENSITIVE Sensitive     PIP/TAZO <=4 SENSITIVE Sensitive ug/mL    * ABUNDANT PROTEUS MIRABILIS   Providencia rettgeri - MIC*    AMPICILLIN >=32 RESISTANT Resistant     CEFEPIME <=0.12 SENSITIVE Sensitive     CEFTAZIDIME <=1 SENSITIVE Sensitive     CEFTRIAXONE <=0.25 SENSITIVE Sensitive     CIPROFLOXACIN <=0.25 SENSITIVE Sensitive     GENTAMICIN <=1 SENSITIVE Sensitive     IMIPENEM 2 SENSITIVE Sensitive     TRIMETH/SULFA <=20 SENSITIVE Sensitive     AMPICILLIN/SULBACTAM 16 INTERMEDIATE Intermediate     PIP/TAZO <=4 SENSITIVE Sensitive ug/mL    * ABUNDANT PROVIDENCIA RETTGERI  MRSA Next Gen by PCR, Nasal     Status: None   Collection Time: 04/19/23 12:52 AM   Specimen: Nasal Mucosa; Nasal Swab   Result Value Ref Range Status   MRSA by PCR Next Gen NOT DETECTED NOT DETECTED Final    Comment: (NOTE) The GeneXpert MRSA Assay (FDA approved for NASAL specimens only), is one component of a comprehensive MRSA colonization surveillance program. It is not intended to diagnose MRSA infection nor to guide or monitor treatment for MRSA infections. Test performance is not FDA approved in patients less than 55 years old. Performed at [facility], [address] [place], [place] [address]   Culture, blood (Routine X 2) w Reflex to ID Panel     Status: None   Collection Time: 04/22/23 12:27 PM   Specimen: BLOOD  Result Value Ref Range Status   Specimen Description BLOOD BLOOD LEFT HAND  Final   Special Requests   Final    BOTTLES DRAWN AEROBIC AND ANAEROBIC Blood Culture adequate volume   Culture   Final    NO GROWTH 5 DAYS Performed at [facility], [address] [place], [place] [address]    Report Status 04/27/2023 FINAL  Final  Culture, blood (Routine X 2) w Reflex to ID Panel     Status: None   Collection Time: 04/22/23 12:37 PM   Specimen: BLOOD  Result Value Ref Range Status   Specimen Description BLOOD BLOOD RIGHT HAND  Final   Special Requests   Final    BOTTLES DRAWN AEROBIC AND ANAEROBIC Blood Culture adequate volume   Culture   Final    NO GROWTH 5 DAYS Performed at [facility], [address], [place], [place] [address]    Report Status 04/27/2023 FINAL  Final  Culture, Respiratory w Gram Stain     Status: None   Collection Time: 04/22/23  1:22 PM   Specimen: Tracheal Aspirate  Result Value Ref Range Status   Specimen Description   Final    TRACHEAL ASPIRATE Performed at [facility], [address], [place], [place] [address]    Special Requests   Final    NONE Performed at [facility], [address] [place], [place] [address]    Gram Stain   Final    ABUNDANT SQUAMOUS EPITHELIAL CELLS PRESENT ABUNDANT  WBC PRESENT, PREDOMINANTLY PMN FEW GRAM NEGATIVE RODS RARE GRAM POSITIVE COCCI IN PAIRS Performed at [facility], [address] [place], [place] [address]    Culture   Final    MODERATE PSEUDOMONAS AERUGINOSA MODERATE PROTEUS MIRABILIS MODERATE ENTEROCOCCUS FAECALIS    Report Status 04/27/2023 FINAL  Final   Organism ID, Bacteria PSEUDOMONAS AERUGINOSA  Final   Organism ID, Bacteria PROTEUS MIRABILIS  Final   Organism ID, Bacteria ENTEROCOCCUS FAECALIS  Final      Susceptibility   Enterococcus faecalis - MIC*    AMPICILLIN <=2 SENSITIVE Sensitive     VANCOMYCIN 2 SENSITIVE Sensitive     GENTAMICIN SYNERGY SENSITIVE Sensitive     * MODERATE ENTEROCOCCUS FAECALIS   Pseudomonas aeruginosa - MIC*    CEFTAZIDIME 4 SENSITIVE Sensitive     CIPROFLOXACIN >=4 RESISTANT Resistant     GENTAMICIN 8 INTERMEDIATE Intermediate     IMIPENEM 2 SENSITIVE Sensitive     PIP/TAZO 16 SENSITIVE Sensitive ug/mL    * MODERATE PSEUDOMONAS AERUGINOSA   Proteus mirabilis - MIC*    AMPICILLIN <=2 SENSITIVE Sensitive     CEFEPIME <=0.12 SENSITIVE Sensitive     CEFTAZIDIME <=1 SENSITIVE Sensitive     CEFTRIAXONE <=0.25 SENSITIVE Sensitive     CIPROFLOXACIN <=0.25 SENSITIVE Sensitive     GENTAMICIN <=1 SENSITIVE Sensitive     IMIPENEM 2 SENSITIVE Sensitive     TRIMETH/SULFA <=20 SENSITIVE Sensitive     AMPICILLIN/SULBACTAM <=2 SENSITIVE Sensitive     PIP/TAZO <=4 SENSITIVE Sensitive ug/mL    * MODERATE PROTEUS MIRABILIS  Resp panel by RT-PCR (RSV, Flu A&B, Covid) Anterior Nasal Swab     Status: None   Collection Time: 04/22/23  5:53 PM   Specimen:  Anterior Nasal Swab  Result Value Ref Range Status   SARS Coronavirus 2 by RT PCR NEGATIVE NEGATIVE Final    Comment: (NOTE) SARS-CoV-2 target nucleic acids are NOT DETECTED.  The SARS-CoV-2 RNA is generally detectable in upper respiratory specimens during the acute phase of infection. The lowest concentration of SARS-CoV-2 viral copies this assay  can detect is 138 copies/mL. A negative result does not preclude SARS-Cov-2 infection and should not be used as the sole basis for treatment or other patient management decisions. A negative result may occur with  improper specimen collection/handling, submission of specimen other than nasopharyngeal swab, presence of viral mutation(s) within the areas targeted by this assay, and inadequate number of viral copies(<138 copies/mL). A negative result must be combined with clinical observations, patient history, and epidemiological information. The expected result is Negative.  Fact Sheet for Patients:  BloggerCourse.com  Fact Sheet for Healthcare Providers:  SeriousBroker.it  This test is no t yet approved or cleared by the [place] FDA and  has been authorized for detection and/or diagnosis of SARS-CoV-2 by FDA under an Emergency Use Authorization (EUA). This EUA will remain  in effect (meaning this test can be used) for the duration of the COVID-19 declaration under Section 564(b)(1) of the Act, 21 U.S.C.section 360bbb-3(b)(1), unless the authorization is terminated  or revoked sooner.       Influenza A by PCR NEGATIVE NEGATIVE Final   Influenza B by PCR NEGATIVE NEGATIVE Final    Comment: (NOTE) The Xpert Xpress SARS-CoV-2/FLU/RSV plus assay is intended as an aid in the diagnosis of influenza from Nasopharyngeal swab specimens and should not be used as a sole basis for treatment. Nasal washings and aspirates are unacceptable for Xpert Xpress SARS-CoV-2/FLU/RSV testing.  Fact Sheet for Patients: BloggerCourse.com  Fact Sheet for Healthcare Providers: SeriousBroker.it  This test is not yet approved or cleared by the [place] FDA and has been authorized for detection and/or diagnosis of SARS-CoV-2 by FDA under an Emergency Use Authorization (EUA). This EUA will  remain in effect (meaning this test can be used) for the duration of the COVID-19 declaration under Section 564(b)(1) of the Act, 21 U.S.C. section 360bbb-3(b)(1), unless the authorization is terminated or revoked.     Resp Syncytial Virus by PCR NEGATIVE NEGATIVE Final    Comment: (NOTE) Fact Sheet for Patients: BloggerCourse.com  Fact Sheet for Healthcare Providers: SeriousBroker.it  This test is not yet approved or cleared by the [place] FDA and has been authorized for detection and/or diagnosis of SARS-CoV-2 by FDA under an Emergency Use Authorization (EUA). This EUA will remain in effect (meaning this test can be used) for the duration of the COVID-19 declaration under Section 564(b)(1) of the Act, 21 U.S.C. section 360bbb-3(b)(1), unless the authorization is terminated or revoked.  Performed at [facility], [address], [place], [place] [address]   Urine Culture (for pregnant, neutropenic or urologic patients or patients with an indwelling urinary catheter)     Status: None   Collection Time: 04/24/23  7:35 PM   Specimen: Urine, Random  Result Value Ref Range Status   Specimen Description   Final    URINE, RANDOM Performed at [facility], [address] [place], [place] [address]    Special Requests   Final    NONE Performed at [facility], [address], [place], [place] [address]    Culture   Final    NO GROWTH Performed at [facility], [address] [place], [place] [address]    Report Status 04/25/2023 FINAL  Final  Culture, BAL-quantitative w Gram Stain     Status: Abnormal   Collection Time: 04/27/23  3:32 PM   Specimen: Bronchoalveolar Lavage; Respiratory  Result Value Ref Range Status   Specimen Description   Final    BRONCHIAL ALVEOLAR LAVAGE Performed at [facility], [address], [place], [place] [address]    Special Requests   Final     NONE Performed at [facility], [address] [place], [place] [address]    Gram Stain   Final    FEW WBC PRESENT, PREDOMINANTLY PMN FEW GRAM NEGATIVE RODS Performed at [facility], [address], [place], [place] [address]    Culture (A)  Final    >=100,000 COLONIES/mL PSEUDOMONAS AERUGINOSA 30,000 COLONIES/mL ENTEROCOCCUS FAECIUM VANCOMYCIN RESISTANT ENTEROCOCCUS ISOLATED    Report Status 04/30/2023 FINAL  Final   Organism ID, Bacteria PSEUDOMONAS AERUGINOSA (A)  Final   Organism ID, Bacteria ENTEROCOCCUS FAECIUM (A)  Final      Susceptibility   Enterococcus faecium - MIC*    AMPICILLIN >=32 RESISTANT Resistant     VANCOMYCIN >=32 RESISTANT Resistant     GENTAMICIN SYNERGY SENSITIVE Sensitive     LINEZOLID 2 SENSITIVE Sensitive     * 30,000 COLONIES/mL ENTEROCOCCUS FAECIUM   Pseudomonas aeruginosa - MIC*    CEFTAZIDIME 4 SENSITIVE Sensitive     CIPROFLOXACIN 2 RESISTANT Resistant     GENTAMICIN <=1 SENSITIVE Sensitive     IMIPENEM 2 SENSITIVE Sensitive     CEFEPIME 2 SENSITIVE Sensitive     * >=100,000 COLONIES/mL PSEUDOMONAS AERUGINOSA  MRSA Next Gen by PCR, Nasal     Status: None   Collection Time: 04/27/23  4:50 PM   Specimen: Nasal Mucosa; Nasal Swab  Result Value Ref Range Status   MRSA by PCR Next Gen NOT DETECTED NOT DETECTED Final    Comment: (NOTE) The GeneXpert MRSA Assay (FDA approved for NASAL specimens only), is one component of a comprehensive MRSA colonization surveillance program. It is not intended to diagnose MRSA infection nor to guide or monitor treatment for MRSA infections. Test performance is not FDA approved in patients less than 33 years old. Performed at [facility], [address] [place], [place] [address]     Coagulation Studies: No results for input(s): "LABPROT", "INR" in the last 72 hours.  Urinalysis: No results for input(s): "COLORURINE", "LABSPEC", "PHURINE", "GLUCOSEU", "HGBUR", "BILIRUBINUR",  "KETONESUR", "PROTEINUR", "UROBILINOGEN", "NITRITE", "LEUKOCYTESUR" in the last 72 hours.  Invalid input(s): "APPERANCEUR"     Imaging: DG Chest Port 1 View Result Date: 04/30/2023 CLINICAL DATA:  Acute hypoxic respiratory failure. EXAM: PORTABLE CHEST 1 VIEW COMPARISON:  Radiographs 04/29/2023 and 04/26/2023.  CT 04/24/2023. FINDINGS: 1805 hours. Tracheostomy tube remains adequately positioned. The heart size and mediastinal contours are stable with cardiac enlargement. There are persistent left greater than right basilar airspace opacities and possible small bilateral pleural effusions. No pneumothorax. The bones appear unchanged. IMPRESSION: Persistent left greater than right basilar airspace opacities and possible small bilateral pleural effusions. No significant change from prior study. Electronically Signed   By: Carey Bullocks M.D.   On: 04/30/2023 18:33   DG Chest Port 1 View Result Date: 04/29/2023 CLINICAL DATA:  Acute hypoxic respiratory failure EXAM: PORTABLE CHEST 1 VIEW COMPARISON:  03/25/2023 FINDINGS: Tracheostomy tube in satisfactory position poor inspiration. Stable enlarged cardiac silhouette less patchy opacity in both lungs with dense residual left  lower lobe opacity. No definite pleural fluid seen. Unremarkable bones. IMPRESSION: Improved aeration of both lungs with dense residual left lower lobe atelectasis or pneumonia. Electronically Signed   By: Beckie Salts M.D.   On: 04/29/2023 13:59      Medications:    amiodarone 60 mg/hr (04/20/2023 1018)   amiodarone     esmolol     feeding supplement (NEPRO CARB STEADY) 50 mL/hr at 04/10/2023 0806   furosemide (LASIX) 200 mg in dextrose 5 % 100 mL (2 mg/mL) infusion 4 mg/hr (05/03/2023 0806)   insulin 24 Units/hr (04/24/2023 1015)   lidocaine 1 mg/min (04/26/2023 0806)   phenylephrine (NEO-SYNEPHRINE) Adult infusion Stopped (04/30/23 2301)   piperacillin-tazobactam (ZOSYN)  IV 12.5 mL/hr at 04/07/2023 0806    amiodarone  150 mg  Intravenous Once   apixaban  5 mg Per Tube BID   atropine  2 drop Sublingual QID   carvedilol  6.25 mg Per Tube BID WC   Chlorhexidine Gluconate Cloth  6 each Topical Daily   clonazePAM  0.5 mg Per Tube BID   esmolol  500 mcg/kg Intravenous Once   famotidine  20 mg Per Tube BID   feeding supplement (PROSource TF20)  60 mL Per Tube Daily   free water  30 mL Per Tube Q4H   glycopyrrolate  1 mg Per Tube TID   ipratropium-albuterol  3 mL Nebulization TID   levETIRAcetam  750 mg Per Tube BID   metoCLOPramide  10 mg Per Tube Q8H   nutrition supplement (JUVEN)  1 packet Per Tube BID BM   mouth rinse  15 mL Mouth Rinse 4 times per day   oxyCODONE  2.5 mg Per Tube Q8H   sodium bicarbonate       valproic acid  750 mg Per Tube TID   acetaminophen (TYLENOL) oral liquid 160 mg/5 mL, albuterol, amiodarone, dextrose, guaiFENesin, iohexol, LORazepam, ondansetron (ZOFRAN) IV, mouth rinse, polyethylene glycol, sodium bicarbonate  Assessment/ Plan:  Mr. Billy Cherry is a 53 y.o.  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  Acute kidney injury, secondary to inflammation and infection resulting in ATN.  Imaging-no hydronephrosis noted. Urinalysis-04/24/2023-small hemoglobin 11-20 RBCs, rare  WBCs Likely ATN secondary to concurrent illness, pneumonia. Update: Urine output down to 575 and likely representative of progressing ATN in the setting of critical illness, arrhythmia, and relative hypotension.  Family would like to proceed with renal placement therapy.  We will plan for CRRT at this time using a 4K bath with net even fluid balance goal.  Overall patient would make a very poor long-term dialysis candidate.  Lab Results  Component Value Date   CREATININE 3.75 (H) 04/21/2023   CREATININE 3.62 (H) 04/30/2023   CREATININE 3.48 (H) 04/30/2023    Intake/Output Summary (Last 24 hours) at 04/13/2023 1032 Last data filed at 04/22/2023 0820 Gross per 24 hour  Intake 3018.05 ml  Output 650 ml  Net 2368.05 ml    2.  Acute respiratory failure. Patient currently requiring ventilatory support as per pulmonary/critical care.  pCO2 down to 53 at the moment.  3.  Anemia unspecified.  Hemoglobin down to 6.4.  Consider blood transfusion but defer to primary team.   LOS: 13 Doreatha Offer 2/26/202510:32 AM

## 2023-05-04 NOTE — IPAL (Signed)
  Interdisciplinary Goals of Care Family Meeting   Date carried out: 04/27/2023  Location of the meeting: Phone conference  Member's involved: Physician, Nurse Practitioner, Bedside Registered Nurse, and Family Member or next of kin    GOALS OF CARE DISCUSSION  The Clinical status was relayed to family in detail- Wife  Updated and notified of patients medical condition- Patient remains unresponsive and will not open eyes to command.   Patient with increased WOB and using accessory muscles to breathe Explained to family course of therapy and the modalities  Patient with Progressive multiorgan failure with a very high probablity of a very minimal chance of meaningful recovery despite all aggressive and optimal medical therapy.    PATIENT REMAINS FULL CODE  The Wife is very unreasonable and expectations is not realistic Patient has severe brain damage Patient s/p cardiac arrest after TRACH change, renal failure is worsening Wife updated several times-written Consent obtained for trach change and told her that patient is at risk for cardiac arrest-and with subsequent trach change, patient had cardiac arrest.    Overall, patient is suffering and in with severe brain damage with resp failure, heart failure and renal failure, patient has NO chance of meaningful recovery    Family are satisfied with Plan of action and management. All questions answered  Additional CC time 45 mins   Immanuel Fedak Santiago Glad, M.D.  [facility]  Medical Director [facility] Medical Director [facility] Cardio-Pulmonary Department

## 2023-05-04 DEATH — deceased
